# Patient Record
Sex: Female | Born: 1975 | Race: White | Hispanic: No | Marital: Single | Smoking: Never smoker
Health system: Southern US, Community
[De-identification: ages and names within clinical notes are randomized; demographics above are authoritative.]

## PROBLEM LIST (undated history)

## (undated) DIAGNOSIS — F419 Anxiety disorder, unspecified: Secondary | ICD-10-CM

## (undated) DIAGNOSIS — E348 Other specified endocrine disorders: Secondary | ICD-10-CM

## (undated) DIAGNOSIS — R0600 Dyspnea, unspecified: Secondary | ICD-10-CM

## (undated) DIAGNOSIS — R079 Chest pain, unspecified: Secondary | ICD-10-CM

## (undated) DIAGNOSIS — G709 Myoneural disorder, unspecified: Secondary | ICD-10-CM

## (undated) DIAGNOSIS — N183 Chronic kidney disease, stage 3 unspecified: Secondary | ICD-10-CM

## (undated) DIAGNOSIS — F319 Bipolar disorder, unspecified: Secondary | ICD-10-CM

## (undated) DIAGNOSIS — R011 Cardiac murmur, unspecified: Secondary | ICD-10-CM

## (undated) DIAGNOSIS — K7460 Unspecified cirrhosis of liver: Secondary | ICD-10-CM

## (undated) DIAGNOSIS — F609 Personality disorder, unspecified: Secondary | ICD-10-CM

## (undated) DIAGNOSIS — M1990 Unspecified osteoarthritis, unspecified site: Secondary | ICD-10-CM

## (undated) DIAGNOSIS — T7840XA Allergy, unspecified, initial encounter: Secondary | ICD-10-CM

## (undated) DIAGNOSIS — I10 Essential (primary) hypertension: Secondary | ICD-10-CM

## (undated) DIAGNOSIS — I319 Disease of pericardium, unspecified: Secondary | ICD-10-CM

## (undated) DIAGNOSIS — Z944 Liver transplant status: Secondary | ICD-10-CM

## (undated) DIAGNOSIS — F329 Major depressive disorder, single episode, unspecified: Secondary | ICD-10-CM

## (undated) DIAGNOSIS — N939 Abnormal uterine and vaginal bleeding, unspecified: Secondary | ICD-10-CM

## (undated) DIAGNOSIS — E119 Type 2 diabetes mellitus without complications: Secondary | ICD-10-CM

## (undated) DIAGNOSIS — F4310 Post-traumatic stress disorder, unspecified: Secondary | ICD-10-CM

## (undated) DIAGNOSIS — C801 Malignant (primary) neoplasm, unspecified: Secondary | ICD-10-CM

## (undated) DIAGNOSIS — N289 Disorder of kidney and ureter, unspecified: Secondary | ICD-10-CM

## (undated) DIAGNOSIS — G4730 Sleep apnea, unspecified: Secondary | ICD-10-CM

## (undated) DIAGNOSIS — IMO0002 Reserved for concepts with insufficient information to code with codable children: Secondary | ICD-10-CM

## (undated) DIAGNOSIS — I4510 Unspecified right bundle-branch block: Secondary | ICD-10-CM

## (undated) DIAGNOSIS — G43909 Migraine, unspecified, not intractable, without status migrainosus: Secondary | ICD-10-CM

## (undated) DIAGNOSIS — B0229 Other postherpetic nervous system involvement: Secondary | ICD-10-CM

## (undated) DIAGNOSIS — K754 Autoimmune hepatitis: Secondary | ICD-10-CM

## (undated) DIAGNOSIS — K219 Gastro-esophageal reflux disease without esophagitis: Secondary | ICD-10-CM

## (undated) DIAGNOSIS — Z87442 Personal history of urinary calculi: Secondary | ICD-10-CM

## (undated) DIAGNOSIS — I4581 Long QT syndrome: Secondary | ICD-10-CM

## (undated) DIAGNOSIS — C8330 Diffuse large B-cell lymphoma, unspecified site: Secondary | ICD-10-CM

## (undated) DIAGNOSIS — N2889 Other specified disorders of kidney and ureter: Secondary | ICD-10-CM

## (undated) DIAGNOSIS — M329 Systemic lupus erythematosus, unspecified: Secondary | ICD-10-CM

## (undated) DIAGNOSIS — Z820 Family history of epilepsy and other diseases of the nervous system: Secondary | ICD-10-CM

## (undated) DIAGNOSIS — C8590 Non-Hodgkin lymphoma, unspecified, unspecified site: Secondary | ICD-10-CM

## (undated) DIAGNOSIS — G629 Polyneuropathy, unspecified: Secondary | ICD-10-CM

## (undated) DIAGNOSIS — N189 Chronic kidney disease, unspecified: Secondary | ICD-10-CM

## (undated) HISTORY — PX: CHOLECYSTECTOMY: SHX55

## (undated) HISTORY — DX: Anxiety disorder, unspecified: F41.9

## (undated) HISTORY — DX: Chronic kidney disease, stage 3 unspecified: N18.30

## (undated) HISTORY — PX: RENAL BIOPSY: SHX156

## (undated) HISTORY — PX: COLONOSCOPY: SHX174

## (undated) HISTORY — DX: Malignant (primary) neoplasm, unspecified: C80.1

## (undated) HISTORY — DX: Morbid (severe) obesity due to excess calories: E66.01

## (undated) HISTORY — PX: LUMBAR PUNCTURE: SHX1985

## (undated) HISTORY — DX: Cardiac murmur, unspecified: R01.1

## (undated) HISTORY — DX: Disease of pericardium, unspecified: I31.9

## (undated) HISTORY — DX: Allergy, unspecified, initial encounter: T78.40XA

## (undated) HISTORY — DX: Migraine, unspecified, not intractable, without status migrainosus: G43.909

## (undated) HISTORY — DX: Chest pain, unspecified: R07.9

## (undated) HISTORY — DX: Gastro-esophageal reflux disease without esophagitis: K21.9

## (undated) HISTORY — DX: Personality disorder, unspecified: F60.9

## (undated) HISTORY — DX: Post-traumatic stress disorder, unspecified: F43.10

## (undated) HISTORY — DX: Polyneuropathy, unspecified: G62.9

## (undated) HISTORY — DX: Other specified disorders of kidney and ureter: N28.89

## (undated) HISTORY — DX: Long QT syndrome: I45.81

## (undated) HISTORY — DX: Myoneural disorder, unspecified: G70.9

## (undated) HISTORY — PX: ESOPHAGOGASTRODUODENOSCOPY: SHX1529

## (undated) HISTORY — PX: PORT A CATH INJECTION (ARMC HX): HXRAD1731

## (undated) HISTORY — DX: Other postherpetic nervous system involvement: B02.29

## (undated) HISTORY — DX: Major depressive disorder, single episode, unspecified: F32.9

## (undated) HISTORY — DX: Abnormal uterine and vaginal bleeding, unspecified: N93.9

## (undated) HISTORY — DX: Liver transplant status: Z94.4

## (undated) HISTORY — DX: Unspecified right bundle-branch block: I45.10

## (undated) HISTORY — DX: Unspecified osteoarthritis, unspecified site: M19.90

## (undated) HISTORY — DX: Bipolar disorder, unspecified: F31.9

## (undated) HISTORY — PX: HERNIA REPAIR: SHX51

## (undated) HISTORY — PX: OTHER SURGICAL HISTORY: SHX169

---
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
## 2009-10-07 HISTORY — PX: BREAST BIOPSY: SHX20

## 2010-12-26 DIAGNOSIS — M545 Low back pain, unspecified: Secondary | ICD-10-CM | POA: Insufficient documentation

## 2012-06-29 DIAGNOSIS — G519 Disorder of facial nerve, unspecified: Secondary | ICD-10-CM | POA: Insufficient documentation

## 2012-12-28 DIAGNOSIS — N2889 Other specified disorders of kidney and ureter: Secondary | ICD-10-CM | POA: Insufficient documentation

## 2013-07-26 DIAGNOSIS — E1165 Type 2 diabetes mellitus with hyperglycemia: Secondary | ICD-10-CM | POA: Insufficient documentation

## 2013-07-26 DIAGNOSIS — IMO0002 Reserved for concepts with insufficient information to code with codable children: Secondary | ICD-10-CM | POA: Insufficient documentation

## 2013-07-26 DIAGNOSIS — Z944 Liver transplant status: Secondary | ICD-10-CM | POA: Insufficient documentation

## 2013-07-26 DIAGNOSIS — M5116 Intervertebral disc disorders with radiculopathy, lumbar region: Secondary | ICD-10-CM | POA: Insufficient documentation

## 2013-10-07 DIAGNOSIS — C8330 Diffuse large B-cell lymphoma, unspecified site: Secondary | ICD-10-CM

## 2013-10-07 HISTORY — PX: OTHER SURGICAL HISTORY: SHX169

## 2013-10-07 HISTORY — DX: Diffuse large B-cell lymphoma, unspecified site: C83.30

## 2014-10-07 HISTORY — PX: BREAST SURGERY: SHX581

## 2014-12-06 HISTORY — PX: BREAST BIOPSY: SHX20

## 2015-01-13 DIAGNOSIS — C8330 Diffuse large B-cell lymphoma, unspecified site: Secondary | ICD-10-CM | POA: Insufficient documentation

## 2015-01-13 HISTORY — PX: BONE MARROW BIOPSY: SHX199

## 2016-11-19 DIAGNOSIS — M792 Neuralgia and neuritis, unspecified: Secondary | ICD-10-CM | POA: Insufficient documentation

## 2016-11-19 DIAGNOSIS — B0229 Other postherpetic nervous system involvement: Secondary | ICD-10-CM | POA: Insufficient documentation

## 2017-02-07 DIAGNOSIS — N2581 Secondary hyperparathyroidism of renal origin: Secondary | ICD-10-CM | POA: Insufficient documentation

## 2017-03-18 DIAGNOSIS — F339 Major depressive disorder, recurrent, unspecified: Secondary | ICD-10-CM | POA: Insufficient documentation

## 2017-03-18 DIAGNOSIS — N939 Abnormal uterine and vaginal bleeding, unspecified: Secondary | ICD-10-CM | POA: Insufficient documentation

## 2017-07-02 DIAGNOSIS — F3175 Bipolar disorder, in partial remission, most recent episode depressed: Secondary | ICD-10-CM | POA: Insufficient documentation

## 2017-07-02 DIAGNOSIS — F319 Bipolar disorder, unspecified: Secondary | ICD-10-CM | POA: Insufficient documentation

## 2019-03-18 ENCOUNTER — Encounter: Payer: Self-pay | Admitting: Family Medicine

## 2019-03-21 ENCOUNTER — Other Ambulatory Visit: Payer: Self-pay

## 2019-03-21 ENCOUNTER — Encounter: Payer: Self-pay | Admitting: Family Medicine

## 2019-03-21 ENCOUNTER — Encounter: Payer: Self-pay | Admitting: Intensive Care

## 2019-03-21 ENCOUNTER — Emergency Department
Admission: EM | Admit: 2019-03-21 | Discharge: 2019-03-21 | Disposition: A | Discharge status: Home / Self Care | Payer: Medicaid Other | Attending: Emergency Medicine | Admitting: Emergency Medicine

## 2019-03-21 DIAGNOSIS — R51 Headache: Secondary | ICD-10-CM | POA: Diagnosis not present

## 2019-03-21 DIAGNOSIS — N189 Chronic kidney disease, unspecified: Secondary | ICD-10-CM | POA: Diagnosis not present

## 2019-03-21 DIAGNOSIS — I129 Hypertensive chronic kidney disease with stage 1 through stage 4 chronic kidney disease, or unspecified chronic kidney disease: Secondary | ICD-10-CM | POA: Insufficient documentation

## 2019-03-21 DIAGNOSIS — E119 Type 2 diabetes mellitus without complications: Secondary | ICD-10-CM | POA: Diagnosis not present

## 2019-03-21 DIAGNOSIS — R519 Headache, unspecified: Secondary | ICD-10-CM

## 2019-03-21 DIAGNOSIS — L930 Discoid lupus erythematosus: Secondary | ICD-10-CM | POA: Diagnosis not present

## 2019-03-21 DIAGNOSIS — Z760 Encounter for issue of repeat prescription: Secondary | ICD-10-CM | POA: Insufficient documentation

## 2019-03-21 HISTORY — DX: Essential (primary) hypertension: I10

## 2019-03-21 HISTORY — DX: Type 2 diabetes mellitus without complications: E11.9

## 2019-03-21 HISTORY — DX: Chronic kidney disease, unspecified: N18.9

## 2019-03-21 HISTORY — DX: Systemic lupus erythematosus, unspecified: M32.9

## 2019-03-21 HISTORY — DX: Family history of epilepsy and other diseases of the nervous system: Z82.0

## 2019-03-21 HISTORY — DX: Disorder of kidney and ureter, unspecified: N28.9

## 2019-03-21 HISTORY — DX: Reserved for concepts with insufficient information to code with codable children: IMO0002

## 2019-03-21 MED ORDER — PREGABALIN 200 MG PO CAPS: 200.0000 mg | ORAL_CAPSULE | Freq: Three times a day (TID) | ORAL | 0 refills | Status: DC

## 2019-03-21 MED ORDER — KETOROLAC TROMETHAMINE 30 MG/ML IJ SOLN
30.0000 mg | Freq: Once | INTRAMUSCULAR | Status: AC
  Administered 2019-03-21: 30 mg via INTRAMUSCULAR
  Filled 2019-03-21: qty 1

## 2019-03-21 NOTE — ED Notes (Signed)
Pt verbalized understanding of discharge instructions. NAD at this time. 

## 2019-03-21 NOTE — ED Triage Notes (Signed)
Patient reports she is here for a lyrica refill

## 2019-03-21 NOTE — ED Provider Notes (Signed)
[facility] Emergency Department Provider Note  ____________________________________________  Time seen: Approximately 4:03 PM  I have reviewed the triage vital signs and the nursing notes.   HISTORY  Chief Complaint No chief complaint on file.    HPI Alison [name] is a 43 y.o. female presents to the emergency department for a refill of Lyrica.  Patient states that she has a [place] but [place]  She has an appointment with a local PCP for 29 June.  She has been taking a half dose of her Lyrica and has had a headache since reducing dose.  No other alleviating measures have been attempted.        Past Medical History:  Diagnosis Date  . Chronic kidney failure   . Diabetes mellitus without complication ([place])   . FH: trigeminal neuralgia   . Hypertension   . Lupus ([place])   . Lymphoma ([place])   . Renal disorder     There are no active problems to display for this patient.   Past Surgical History:  Procedure Laterality Date  . LIVER TRANSPLANT  12/17/1991    Prior to Admission medications   Medication Sig Start Date End Date Taking? Authorizing Provider  pregabalin (LYRICA) 200 MG capsule Take 1 capsule (200 mg total) by mouth 3 (three) times daily for 30 days. 03/21/19 04/20/19  Lannie Fields, PA-C    Allergies Penicillins  History reviewed. No pertinent family history.  Social History Social History   Tobacco Use  . Smoking status: Never Smoker  . Smokeless tobacco: Never Used  Substance Use Topics  . Alcohol use: Never    Frequency: Never  . Drug use: Not Currently    Types: Marijuana     Review of Systems  Constitutional: No fever/chills Eyes: No visual changes. No discharge ENT: No upper respiratory complaints. Cardiovascular: no chest pain. Respiratory: no cough. No SOB. Gastrointestinal: No abdominal pain.  No nausea, no vomiting.  No diarrhea.  No constipation. Musculoskeletal: Negative  for musculoskeletal pain. Skin: Negative for rash, abrasions, lacerations, ecchymosis. Neurological: Patient has headache, no focal weakness or numbness.   ____________________________________________   PHYSICAL EXAM:  VITAL SIGNS: ED Triage Vitals  Enc Vitals Group     BP 03/21/19 1401 122/69     Pulse Rate 03/21/19 1353 92     Resp 03/21/19 1353 16     Temp 03/21/19 1353 98.6 F (37 C)     Temp Source 03/21/19 1353 Oral     SpO2 03/21/19 1353 94 %     Weight 03/21/19 1355 260 lb (117.9 kg)     Height 03/21/19 1355 5' 6"  (1.676 m)     Head Circumference --      Peak Flow --      Pain Score 03/21/19 1354 7     Pain Loc --      Pain Edu? --      Excl. in [place]? --      Constitutional: Alert and oriented. Well appearing and in no acute distress. Eyes: Conjunctivae are normal. PERRL. EOMI. Head: Atraumatic. Cardiovascular: Normal rate, regular rhythm. Normal S1 and S2.  Good peripheral circulation. Respiratory: Normal respiratory effort without tachypnea or retractions. Lungs CTAB. Good air entry to the bases with no decreased or absent breath sounds. Gastrointestinal: Bowel sounds 4 quadrants. Soft and nontender to palpation. No guarding or rigidity. No palpable masses. No distention. No CVA tenderness. Musculoskeletal: Full range of motion to all  extremities. No gross deformities appreciated. Neurologic:  Normal speech and language. No gross focal neurologic deficits are appreciated.  Skin:  Skin is warm, dry and intact. No rash noted. Psychiatric: Mood and affect are normal. Speech and behavior are normal. Patient exhibits appropriate insight and judgement.   ____________________________________________   LABS (all labs ordered are listed, but only abnormal results are displayed)  Labs Reviewed - No data to display ____________________________________________  EKG   ____________________________________________  RADIOLOGY   No results  found.  ____________________________________________    PROCEDURES  Procedure(s) performed:    Procedures    Medications  ketorolac (TORADOL) 30 MG/ML injection 30 mg (has no administration in time range)     ____________________________________________   INITIAL IMPRESSION / ASSESSMENT AND PLAN / ED COURSE  Pertinent labs & imaging results that were available during my care of the patient were reviewed by me and considered in my medical decision making (see chart for details).  Review of the [place] CSRS was performed in accordance of the [place] prior to dispensing any controlled drugs.           Assessment and plan Medication refill Patient presents to the emergency department for medication refill of her Lyrica.  Patient's medication was refilled at 200 mg 3 times daily until patient is seen and evaluated by her new primary care provider.  She was given Toradol in the emergency department for headache.  All patient questions were answered.   ____________________________________________  FINAL CLINICAL IMPRESSION(S) / ED DIAGNOSES  Final diagnoses:  Medication refill  Acute nonintractable headache, unspecified headache type      NEW MEDICATIONS STARTED DURING THIS VISIT:  ED Discharge Orders         Ordered    pregabalin (LYRICA) 200 MG capsule  3 times daily     03/21/19 1558              This chart was dictated using voice recognition software/Dragon. Despite best efforts to proofread, errors can occur which can change the meaning. Any change was purely unintentional.    Lannie Fields, PA-C 03/21/19 1606    Harvest Dark, MD 03/21/19 2342

## 2019-03-22 ENCOUNTER — Other Ambulatory Visit: Payer: Self-pay

## 2019-03-23 ENCOUNTER — Encounter: Payer: Self-pay | Admitting: Oncology

## 2019-03-23 ENCOUNTER — Inpatient Hospital Stay: Payer: Medicaid Other | Attending: Oncology | Admitting: Oncology

## 2019-03-23 ENCOUNTER — Other Ambulatory Visit: Payer: Self-pay

## 2019-03-23 ENCOUNTER — Inpatient Hospital Stay: Payer: Medicaid Other

## 2019-03-23 ENCOUNTER — Telehealth: Payer: Self-pay

## 2019-03-23 VITALS — BP 119/75 | HR 80 | Temp 97.8°F | Ht 66.0 in | Wt 249.7 lb

## 2019-03-23 DIAGNOSIS — Z944 Liver transplant status: Secondary | ICD-10-CM | POA: Insufficient documentation

## 2019-03-23 DIAGNOSIS — K759 Inflammatory liver disease, unspecified: Secondary | ICD-10-CM | POA: Insufficient documentation

## 2019-03-23 DIAGNOSIS — M329 Systemic lupus erythematosus, unspecified: Secondary | ICD-10-CM

## 2019-03-23 DIAGNOSIS — T8640 Unspecified complication of liver transplant: Secondary | ICD-10-CM | POA: Diagnosis not present

## 2019-03-23 DIAGNOSIS — R634 Abnormal weight loss: Secondary | ICD-10-CM | POA: Insufficient documentation

## 2019-03-23 DIAGNOSIS — C8330 Diffuse large B-cell lymphoma, unspecified site: Secondary | ICD-10-CM | POA: Insufficient documentation

## 2019-03-23 DIAGNOSIS — E785 Hyperlipidemia, unspecified: Secondary | ICD-10-CM | POA: Insufficient documentation

## 2019-03-23 DIAGNOSIS — R9389 Abnormal findings on diagnostic imaging of other specified body structures: Secondary | ICD-10-CM

## 2019-03-23 DIAGNOSIS — I129 Hypertensive chronic kidney disease with stage 1 through stage 4 chronic kidney disease, or unspecified chronic kidney disease: Secondary | ICD-10-CM | POA: Insufficient documentation

## 2019-03-23 DIAGNOSIS — Z9221 Personal history of antineoplastic chemotherapy: Secondary | ICD-10-CM | POA: Diagnosis not present

## 2019-03-23 DIAGNOSIS — Z7984 Long term (current) use of oral hypoglycemic drugs: Secondary | ICD-10-CM | POA: Insufficient documentation

## 2019-03-23 DIAGNOSIS — M1990 Unspecified osteoarthritis, unspecified site: Secondary | ICD-10-CM | POA: Diagnosis not present

## 2019-03-23 DIAGNOSIS — K754 Autoimmune hepatitis: Secondary | ICD-10-CM | POA: Insufficient documentation

## 2019-03-23 DIAGNOSIS — E669 Obesity, unspecified: Secondary | ICD-10-CM | POA: Diagnosis not present

## 2019-03-23 DIAGNOSIS — R5381 Other malaise: Secondary | ICD-10-CM | POA: Diagnosis not present

## 2019-03-23 DIAGNOSIS — M549 Dorsalgia, unspecified: Secondary | ICD-10-CM | POA: Insufficient documentation

## 2019-03-23 DIAGNOSIS — G43909 Migraine, unspecified, not intractable, without status migrainosus: Secondary | ICD-10-CM | POA: Insufficient documentation

## 2019-03-23 DIAGNOSIS — N183 Chronic kidney disease, stage 3 unspecified: Secondary | ICD-10-CM | POA: Insufficient documentation

## 2019-03-23 DIAGNOSIS — G8929 Other chronic pain: Secondary | ICD-10-CM | POA: Insufficient documentation

## 2019-03-23 DIAGNOSIS — G4733 Obstructive sleep apnea (adult) (pediatric): Secondary | ICD-10-CM | POA: Insufficient documentation

## 2019-03-23 DIAGNOSIS — R5383 Other fatigue: Secondary | ICD-10-CM | POA: Diagnosis not present

## 2019-03-23 DIAGNOSIS — C801 Malignant (primary) neoplasm, unspecified: Secondary | ICD-10-CM | POA: Insufficient documentation

## 2019-03-23 DIAGNOSIS — E119 Type 2 diabetes mellitus without complications: Secondary | ICD-10-CM | POA: Insufficient documentation

## 2019-03-23 DIAGNOSIS — E1122 Type 2 diabetes mellitus with diabetic chronic kidney disease: Secondary | ICD-10-CM | POA: Insufficient documentation

## 2019-03-23 DIAGNOSIS — R110 Nausea: Secondary | ICD-10-CM | POA: Insufficient documentation

## 2019-03-23 DIAGNOSIS — C8590 Non-Hodgkin lymphoma, unspecified, unspecified site: Secondary | ICD-10-CM

## 2019-03-23 DIAGNOSIS — G629 Polyneuropathy, unspecified: Secondary | ICD-10-CM | POA: Insufficient documentation

## 2019-03-23 DIAGNOSIS — N200 Calculus of kidney: Secondary | ICD-10-CM | POA: Insufficient documentation

## 2019-03-23 DIAGNOSIS — G500 Trigeminal neuralgia: Secondary | ICD-10-CM | POA: Insufficient documentation

## 2019-03-23 DIAGNOSIS — K769 Liver disease, unspecified: Secondary | ICD-10-CM

## 2019-03-23 DIAGNOSIS — D47Z1 Post-transplant lymphoproliferative disorder (PTLD): Secondary | ICD-10-CM | POA: Diagnosis not present

## 2019-03-23 DIAGNOSIS — F319 Bipolar disorder, unspecified: Secondary | ICD-10-CM | POA: Insufficient documentation

## 2019-03-23 DIAGNOSIS — Z9989 Dependence on other enabling machines and devices: Secondary | ICD-10-CM | POA: Insufficient documentation

## 2019-03-23 DIAGNOSIS — I10 Essential (primary) hypertension: Secondary | ICD-10-CM | POA: Insufficient documentation

## 2019-03-23 DIAGNOSIS — Z79899 Other long term (current) drug therapy: Secondary | ICD-10-CM | POA: Diagnosis not present

## 2019-03-23 DIAGNOSIS — N2889 Other specified disorders of kidney and ureter: Secondary | ICD-10-CM | POA: Insufficient documentation

## 2019-03-23 DIAGNOSIS — X58XXXS Exposure to other specified factors, sequela: Secondary | ICD-10-CM | POA: Diagnosis not present

## 2019-03-23 DIAGNOSIS — R011 Cardiac murmur, unspecified: Secondary | ICD-10-CM | POA: Insufficient documentation

## 2019-03-23 DIAGNOSIS — M797 Fibromyalgia: Secondary | ICD-10-CM | POA: Diagnosis not present

## 2019-03-23 DIAGNOSIS — N6019 Diffuse cystic mastopathy of unspecified breast: Secondary | ICD-10-CM | POA: Insufficient documentation

## 2019-03-23 LAB — COMPREHENSIVE METABOLIC PANEL
ALT: 25 U/L (ref 0–44)
AST: 30 U/L (ref 15–41)
Albumin: 3.9 g/dL (ref 3.5–5.0)
Alkaline Phosphatase: 56 U/L (ref 38–126)
Anion gap: 9 (ref 5–15)
BUN: 30 mg/dL — ABNORMAL HIGH (ref 6–20)
CO2: 23 mmol/L (ref 22–32)
Calcium: 8.9 mg/dL (ref 8.9–10.3)
Chloride: 106 mmol/L (ref 98–111)
Creatinine, Ser: 1.01 mg/dL — ABNORMAL HIGH (ref 0.44–1.00)
GFR calc Af Amer: >60 mL/min (ref >60)
GFR calc non Af Amer: >60 mL/min (ref >60)
Glucose, Bld: 145 mg/dL — ABNORMAL HIGH (ref 70–99)
Potassium: 4.6 mmol/L (ref 3.5–5.1)
Sodium: 138 mmol/L (ref 135–145)
Total Bilirubin: 0.4 mg/dL (ref 0.3–1.2)
Total Protein: 7.6 g/dL (ref 6.5–8.1)

## 2019-03-23 LAB — CBC WITH DIFFERENTIAL/PLATELET
Abs Immature Granulocytes: 0.01 10*3/uL (ref 0.00–0.07)
Basophils Absolute: 0 10*3/uL (ref 0.0–0.1)
Basophils Relative: 1 %
Eosinophils Absolute: 0.4 10*3/uL (ref 0.0–0.5)
Eosinophils Relative: 7 %
HCT: 39.8 % (ref 36.0–46.0)
Hemoglobin: 12.9 g/dL (ref 12.0–15.0)
Immature Granulocytes: 0 %
Lymphocytes Relative: 36 %
Lymphs Abs: 2.2 10*3/uL (ref 0.7–4.0)
MCH: 28.8 pg (ref 26.0–34.0)
MCHC: 32.4 g/dL (ref 30.0–36.0)
MCV: 88.8 fL (ref 80.0–100.0)
Monocytes Absolute: 0.6 10*3/uL (ref 0.1–1.0)
Monocytes Relative: 9 %
Neutro Abs: 3 10*3/uL (ref 1.7–7.7)
Neutrophils Relative %: 47 %
Platelets: 208 10*3/uL (ref 150–400)
RBC: 4.48 MIL/uL (ref 3.87–5.11)
RDW: 12.6 % (ref 11.5–15.5)
WBC: 6.2 10*3/uL (ref 4.0–10.5)
nRBC: 0 % (ref 0.0–0.2)

## 2019-03-23 LAB — URIC ACID: Uric Acid, Serum: 9.7 mg/dL — ABNORMAL HIGH (ref 2.5–7.1)

## 2019-03-23 LAB — LACTATE DEHYDROGENASE: LDH: 123 U/L (ref 98–192)

## 2019-03-23 NOTE — Progress Notes (Signed)
Patient stated that she has had heavy night sweats, nausea, poor appetite, and pain on her neck and jaw.

## 2019-03-23 NOTE — Telephone Encounter (Signed)
Patient's medical records were faxed to [facility]. They will review chart and then contact patient. Tel: [phone] Fax: [phone]

## 2019-03-24 ENCOUNTER — Encounter: Payer: Self-pay | Admitting: Oncology

## 2019-03-24 LAB — HEPATITIS B SURFACE ANTIGEN: Hepatitis B Surface Ag: NEGATIVE

## 2019-03-24 LAB — HEPATITIS C ANTIBODY: HCV Ab: <0.1 s/co ratio (ref 0.0–0.9)

## 2019-03-24 LAB — HEPATITIS B CORE ANTIBODY, TOTAL: Hep B Core Total Ab: NEGATIVE

## 2019-03-24 LAB — HEPATITIS B SURFACE ANTIBODY, QUANTITATIVE: Hep B S AB Quant (Post): <3.1 m[IU]/mL — ABNORMAL LOW (ref >9.9)

## 2019-03-24 NOTE — Progress Notes (Addendum)
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  She is currently on CellCept and tacrolimus for the same.  She also has a history of post transplant lymphoproliferative disorder/DLBCL that was diagnosed in 2016.  She is s/p 6 cycles of R-CHOP chemotherapy back then and was in complete remission 1.  Her other past medical history significant for migraines, stage III chronic kidney disease, chemo-induced peripheral neuropathy, hypertension among other medical problems.  With regards to her diffuse large B-cell lymphoma she was getting surveillance scans and this was all done in [place].  She has now moved to [facility] to be with her significant other.  She underwent CT neck with contrast before she left [place] on 02/22/2019.  She was noted to have soft tissue masses in both the parotid glands which were new as compared to prior exams and possibly represent lymph nodes.  The largest one was seen in the left parotid gland measuring 1.3 x 1.1 cm.  Before she could get a work-up for this patient moved to [place] and has not had any further work-up yet  Her other prior imaging is as follows: MRI thoracic spine without contrast in August 2019 showed moderate multilevel spondylosis but no evidence of lymphoma.  Multinodular thyroid in June 2019.  MRI cervical spine May 2019 again showed spondylosis but no other acute pathology.  MRI brain showed incidental partial opacification of the right and right sinus.   No evidence of malignancy.  I do not have any other PET CT scan or treatment records from the past.  She will be establishing primary care soon. She has not seen any other physicians yet. She repports left neck swelling. Reports fatigue and feels like she has lost about 10 pounds in the last 1 month  ECOG PS- 1  Pain scale- 4- chronic back pain   Review of systems- Review of Systems  Constitutional: Positive for malaise/fatigue and weight loss. Negative for chills and fever.  HENT: Negative for congestion, ear discharge and nosebleeds.        Neck swelling  Eyes: Negative for blurred vision.  Respiratory: Negative for cough, hemoptysis, sputum production, shortness of breath and wheezing.   Cardiovascular: Negative for chest pain, palpitations, orthopnea and claudication.  Gastrointestinal: Negative for abdominal pain, blood in stool, constipation, diarrhea, heartburn, melena, nausea and vomiting.  Genitourinary: Negative for dysuria, flank pain, frequency, hematuria and urgency.  Musculoskeletal: Positive for back pain. Negative for joint pain and myalgias.  Skin: Negative for rash.  Neurological: Negative for dizziness, tingling, focal weakness, seizures, weakness and headaches.  Endo/Heme/Allergies: Does not bruise/bleed easily.  Psychiatric/Behavioral: Negative for depression and suicidal ideas. The patient does not have insomnia.     Allergies  Allergen Reactions  . Penicillin G Itching and Rash    Patient Active Problem List   Diagnosis Date Noted  . Fibromyalgia 03/23/2019  . Systemic lupus erythematosus (SLE) in adult ([place]) 03/23/2019  . Essential hypertension 03/23/2019  . Hepatitis 03/23/2019  . Mass of left kidney 03/23/2019  . Diabetes mellitus ([place])  03/23/2019  . Nausea without vomiting 03/23/2019  . Neuropathy 03/23/2019  . Cancer ([place]) 03/23/2019  . Hyperlipidemia 03/23/2019  . Osteoarthritis 03/23/2019  . Trigeminal neuralgia 03/23/2019  . Chronic kidney  disease 03/23/2019  . Nephrolithiasis 03/23/2019  . Migraine 03/23/2019  . OSA on CPAP 03/23/2019  . Fibrocystic breast 03/23/2019  . Heart murmur 03/23/2019     Past Medical History:  Diagnosis Date  . Abnormal uterine bleeding   . Bipolar disorder (manic depression) ([place])   . Cancer ([place])   . Chronic kidney failure   . Chronic renal disease, stage III ([place])   . Diabetes mellitus without complication ([place])   . 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[place])   . FH: trigeminal neuralgia   . Hypertension   . Kidney mass   . Lupus ([place])   . Lymphoma ([place])   . Major depressive disorder   . Migraine   . Morbid obesity ([place])   . Neuropathy   . Post herpetic neuralgia   . Renal disorder   . S/P liver transplant [facility])      Past Surgical History:  Procedure Laterality Date  . BONE MARROW BIOPSY  01/13/2015  . BREAST BIOPSY  12/2014  . BREAST BIOPSY  2011  . BREAST SURGERY    . CHOLECYSTECTOMY    . HERNIA REPAIR    . infusaport    . LIVER TRANSPLANT  12/17/1991  . LUMBAR PUNCTURE    . tumor removal  2015    Social History   Socioeconomic History  . Marital status: Single    Spouse name: Not on file  . Number of children: Not on file  . Years of education: Not on file  . Highest education level: Not on file  Occupational History  . Not on file  Social Needs  . Financial resource strain: Not on file  . Food insecurity    Worry: Not on file    Inability: Not on file  . Transportation needs    Medical: Not on file    Non-medical: Not on file  Tobacco Use  . Smoking status: Never Smoker  . Smokeless tobacco: Never Used  Substance and Sexual Activity  . Alcohol use: Never    Frequency: Never  . Drug use: Not Currently    Types: Marijuana  . Sexual activity: Not on file  Lifestyle  . Physical activity    Days per week: Not on file    Minutes per session: Not on file  . Stress: Not on file  Relationships  . Social Herbalist on phone: Not on file     Gets together: Not on file    Attends religious service: Not on file    Active member of club or organization: Not on file    Attends meetings of clubs or organizations: Not on file    Relationship status: Not on file  . Intimate partner violence    Fear of current or ex partner: Not on file    Emotionally abused: Not on file    Physically abused: Not on file    Forced sexual activity: Not on file  Other Topics Concern  . Not on file  Social History Narrative  . Not on file     Family History  Problem Relation Age of Onset  . Heart disease Mother   . Hypertension Mother   . Heart disease Father   . Hypertension Father   . Cancer Maternal Grandmother   .  Cancer Maternal Aunt   . Cancer Maternal Uncle      Current Outpatient Medications:  .  baclofen (LIORESAL) 20 MG tablet, Take 1 tablet by mouth 3 (three) times daily., Disp: , Rfl:  .  Cranberry 125 MG TABS, Take 1 tablet by mouth 1 day or 1 dose., Disp: , Rfl:  .  diphenhydrAMINE (BENADRYL) 50 MG capsule, Take 50 mg by mouth every 6 (six) hours as needed., Disp: , Rfl:  .  HYDROmorphone (DILAUDID) 4 MG tablet, Take 1 tablet by mouth every 6 (six) hours as needed., Disp: , Rfl:  .  lisinopril (ZESTRIL) 20 MG tablet, Take 1 tablet by mouth 1 day or 1 dose., Disp: , Rfl:  .  LORazepam (ATIVAN) 1 MG tablet, Take 1 tablet by mouth at bedtime as needed., Disp: , Rfl:  .  metFORMIN (GLUCOPHAGE) 1000 MG tablet, Take 1 tablet by mouth 2 (two) times a day., Disp: , Rfl:  .  mycophenolate (CELLCEPT) 250 MG capsule, Take 1 capsule by mouth 2 (two) times a day., Disp: , Rfl:  .  oxycodone (ROXICODONE) 30 MG immediate release tablet, Take 1 tablet by mouth every 4 (four) hours as needed., Disp: , Rfl:  .  pregabalin (LYRICA) 200 MG capsule, Take 1 capsule (200 mg total) by mouth 3 (three) times daily for 30 days., Disp: 100 capsule, Rfl: 0 .  promethazine (PHENERGAN) 25 MG tablet, Take 1 tablet by mouth as needed., Disp: , Rfl:  .   tacrolimus (PROGRAF) 1 MG capsule, Take 1 capsule by mouth 1 day or 1 dose., Disp: , Rfl:    Physical exam:  Vitals:   03/23/19 1401 03/24/19 1309  BP:  119/75  Pulse:  80  Temp: 97.8 F (36.6 C)   TempSrc: Tympanic   Weight: 249 lb 11.2 oz (113.3 kg)   Height: 5' 6"  (1.676 m)    Physical Exam Constitutional:      General: She is not in acute distress.    Appearance: She is obese.  HENT:     Head: Normocephalic and atraumatic.  Eyes:     Pupils: Pupils are equal, round, and reactive to light.  Neck:     Musculoskeletal: Normal range of motion.  Cardiovascular:     Rate and Rhythm: Normal rate and regular rhythm.     Heart sounds: Normal heart sounds.  Pulmonary:     Effort: Pulmonary effort is normal.     Breath sounds: Normal breath sounds.  Abdominal:     General: Bowel sounds are normal. There is no distension.     Palpations: Abdomen is soft.     Tenderness: There is no abdominal tenderness.     Comments: No palpable splenomegaly  Lymphadenopathy:     Comments: There is a palpable pea sized 1 cm lymph node adjacent to left parotid gland. No palpable cervical, supraclavicular, axillary or inguinal adenopathy  Skin:    General: Skin is warm and dry.  Neurological:     Mental Status: She is alert and oriented to person, place, and time.        CMP Latest Ref Rng & Units 03/23/2019  Glucose 70 - 99 mg/dL 145(H)  BUN 6 - 20 mg/dL 30(H)  Creatinine 0.44 - 1.00 mg/dL 1.01(H)  Sodium 135 - 145 mmol/L 138  Potassium 3.5 - 5.1 mmol/L 4.6  Chloride 98 - 111 mmol/L 106  CO2 22 - 32 mmol/L 23  Calcium 8.9 - 10.3 mg/dL 8.9  Total Protein 6.5 -  8.1 g/dL 7.6  Total Bilirubin 0.3 - 1.2 mg/dL 0.4  Alkaline Phos 38 - 126 U/L 56  AST 15 - 41 U/L 30  ALT 0 - 44 U/L 25   CBC Latest Ref Rng & Units 03/23/2019  WBC 4.0 - 10.5 K/uL 6.2  Hemoglobin 12.0 - 15.0 g/dL 12.9  Hematocrit 36.0 - 46.0 % 39.8  Platelets 150 - 400 K/uL 208     Assessment and plan- Patient is a 43  y.o. female with h/o stage III CKD, liver transplant on immunosuppression and h/o dlbcl s/p ? RCHOP chemotherapy in 2016 presenting to establish oncology care  1. Patient has palpable pea sized cervical node palpable adjacent to left parotid gland. No other sites of palpable adenopathy. Given her prior h/o DLBCL, I will check cbc with diff, cmp, LDH hepatitis serology and uric acid today. Will obtain PET/CT scan at this time. Depending on findings of PET/CT- if there are concerning findings for recurrent lymphoma- she will need excisional biopsy  2. Refer to [facility] transplant to establish care given her h/o liver transplant on immunosuppression  3. Refer to nephrology given her h/o CKD  I will tentatively see her back in 10 days and hopefully by the she would have had pet/ct and biopsy   Total face to face encounter time for this patient visit was 40 min. >50% of the time was  spent in counseling and coordination of care.     Thank you for this kind referral and the opportunity to participate in the care of this patient   Visit Diagnosis 1. Lymphoma, unspecified body region, unspecified lymphoma type ([place])   2. Abnormal CT scan   3. Chronic renal disease, stage III ([place])     Dr. Randa Evens, MD, MPH [facility] at [facility] [phone] 03/24/2019 12:56 PM

## 2019-03-25 ENCOUNTER — Telehealth: Payer: Self-pay | Admitting: *Deleted

## 2019-03-25 NOTE — Telephone Encounter (Signed)
Patient moved from [place] to [place].   I contacted [facility] at [phone]  and was sent to medical records and to fax a request with consent attached to [phone]. I also asked about get disc for the radiology imaging.  I was told that if I put on consent and fax cover sheet after the get records gathered they will send request to radiology for the discs. I then called [facility] which [facility] cancer center [phone]. I was told to fax consent with what items are needed and they will send it to me. 3rd request was a referral to liver transplant at [facility]. I have contacted the [facility] transplant at [phone] and was transferred to [facility] at [phone] and I left a message about the above patient that has already had a liver transplant and she will need to have a doctor to manage her care She is currently on cellcept and tacrolimus.will await a call back

## 2019-03-26 ENCOUNTER — Telehealth: Payer: Self-pay | Admitting: *Deleted

## 2019-03-26 NOTE — Telephone Encounter (Signed)
Called pt. To let her know that the pet scan has not been approved and it has been r/s 6/29 12:30 she already has the instructions and I explained that the insurance has not approved it yet. She understands. She also said that she wanted to see if she could have her meds refilled for pain. I told her that I did not hear from Dr Janese Banks that we are giving pain med refill. She states she has 2 weeks worth she felt but she could let me know. I gave her my number to call. I said I would ask Janese Banks if she was going to prescribe them. Also we have cancelled her appt to see D.r. Janese Banks. She needs pet scan to make a plan and once she sees results she will call pt and make an appt for her. Patient agreeable.

## 2019-03-30 ENCOUNTER — Ambulatory Visit: Payer: Medicaid Other

## 2019-04-01 ENCOUNTER — Encounter: Payer: Self-pay | Admitting: *Deleted

## 2019-04-02 ENCOUNTER — Ambulatory Visit: Payer: Medicaid Other | Admitting: Oncology

## 2019-04-05 ENCOUNTER — Ambulatory Visit: Payer: Medicaid Other

## 2019-04-05 ENCOUNTER — Ambulatory Visit (INDEPENDENT_AMBULATORY_CARE_PROVIDER_SITE_OTHER): Payer: Medicaid Other | Admitting: Family Medicine

## 2019-04-05 ENCOUNTER — Other Ambulatory Visit: Payer: Self-pay

## 2019-04-05 ENCOUNTER — Encounter: Payer: Self-pay | Admitting: Family Medicine

## 2019-04-05 DIAGNOSIS — F329 Major depressive disorder, single episode, unspecified: Secondary | ICD-10-CM

## 2019-04-05 DIAGNOSIS — M329 Systemic lupus erythematosus, unspecified: Secondary | ICD-10-CM

## 2019-04-05 DIAGNOSIS — G629 Polyneuropathy, unspecified: Secondary | ICD-10-CM

## 2019-04-05 DIAGNOSIS — N183 Chronic kidney disease, stage 3 unspecified: Secondary | ICD-10-CM

## 2019-04-05 DIAGNOSIS — E348 Other specified endocrine disorders: Secondary | ICD-10-CM | POA: Diagnosis not present

## 2019-04-05 DIAGNOSIS — G43809 Other migraine, not intractable, without status migrainosus: Secondary | ICD-10-CM

## 2019-04-05 DIAGNOSIS — F3181 Bipolar II disorder: Secondary | ICD-10-CM

## 2019-04-05 DIAGNOSIS — C8590 Non-Hodgkin lymphoma, unspecified, unspecified site: Secondary | ICD-10-CM | POA: Diagnosis not present

## 2019-04-05 DIAGNOSIS — M5030 Other cervical disc degeneration, unspecified cervical region: Secondary | ICD-10-CM

## 2019-04-05 DIAGNOSIS — M797 Fibromyalgia: Secondary | ICD-10-CM

## 2019-04-05 DIAGNOSIS — F419 Anxiety disorder, unspecified: Secondary | ICD-10-CM

## 2019-04-05 DIAGNOSIS — M79672 Pain in left foot: Secondary | ICD-10-CM

## 2019-04-05 DIAGNOSIS — Z13220 Encounter for screening for lipoid disorders: Secondary | ICD-10-CM

## 2019-04-05 DIAGNOSIS — F32A Depression, unspecified: Secondary | ICD-10-CM

## 2019-04-05 DIAGNOSIS — Z944 Liver transplant status: Secondary | ICD-10-CM | POA: Diagnosis not present

## 2019-04-05 DIAGNOSIS — E1165 Type 2 diabetes mellitus with hyperglycemia: Secondary | ICD-10-CM

## 2019-04-05 DIAGNOSIS — K429 Umbilical hernia without obstruction or gangrene: Secondary | ICD-10-CM

## 2019-04-05 DIAGNOSIS — I10 Essential (primary) hypertension: Secondary | ICD-10-CM | POA: Diagnosis not present

## 2019-04-05 DIAGNOSIS — G500 Trigeminal neuralgia: Secondary | ICD-10-CM

## 2019-04-05 MED ORDER — PREGABALIN 200 MG PO CAPS: 200.0000 mg | ORAL_CAPSULE | Freq: Three times a day (TID) | ORAL | 1 refills | Status: DC

## 2019-04-05 MED ORDER — BACLOFEN 20 MG PO TABS: 20.0000 mg | ORAL_TABLET | Freq: Three times a day (TID) | ORAL | 2 refills | Status: DC

## 2019-04-05 MED ORDER — SULFAMETHOXAZOLE-TRIMETHOPRIM 800-160 MG PO TABS: 1.0000 | ORAL_TABLET | Freq: Two times a day (BID) | ORAL | 0 refills | Status: AC

## 2019-04-05 MED ORDER — LISINOPRIL 20 MG PO TABS: 20.0000 mg | ORAL_TABLET | ORAL | 1 refills | Status: DC

## 2019-04-05 NOTE — Progress Notes (Signed)
Name: Alison [name]   MRN: [id number]    DOB: 12-Oct-1975   Date:04/05/2019       Progress Note  Subjective  Chief Complaint  Chief Complaint  Patient presents with  . New Patient (Initial Visit)  . Referral    oncology: appt already schedule  . Urinary Tract Infection    1 month states went to [facility] and never called her in anything? symptoms include: buring, cludy, urgency and odor  . Medication Refill    I connected with  [name]  on 04/05/19 at 12:40 PM EDT by a video enabled telemedicine application and verified that I am speaking with the correct person using two identifiers.  I discussed the limitations of evaluation and management by telemedicine and the availability of in person appointments. The patient expressed understanding and agreed to proceed. Staff also discussed with the patient that there may be a patient responsible charge related to this service. Patient Location: Home Provider Location: Office Additional Individuals present: None  HPI  FMS/SLE/DDD: She was diagnosed with FMS for about 10 years, SLE for about 17 years.  She states she has likely had SLE since age 31. Taking lyrica - needs refill.   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  She was ill at age 21 and had a liver transplant at age 43.  She has blood work every 3 months, but has never had any rejection issues.  She is waiting on PET scan 04/07/2019, then plan is for biopsy.  She states that her chronic pain is related to chemo-related damage.  She is taking oxycodone and dilaudid.   Umbilical Hernia: She notes multiple repairs in the past, she denies tenderness, redness, change in color or drainage.  Does not want to see anyone right now for this.  HTN: Taking lisinopril 41m daily; usually runs 120's/70's at home. No chest pain, shortness of breath, BLE edema, or headaches.    Possible LEFT Plantar  Fasciitis: Present for about 1 month, pain on anterior heel with ambulation, feels like there is a knot there when walking, but nothing is palpable.  Does not tenderness over the area.  Massage has seemed to help some.   UTI: She notes ongoing urinary frequency, dysuria, foul smelling cloudy urine.  [facility] but never received ABX.  Notes history of UTI's in the past.  No abnormal back/flank/abdominal pain. No blood in urine.  Does have history of nephrolithiasis. We will treat today.  Diabetes mellitus type 2 Checking sugars?  yes How often? A few times a month Range (low to high) over last two weeks:  135 Diet: She avoids sugar and eats low carb, low sugar/diet drinks Last eye exam:  Due - needs referral. Denies: Polyuria, polydipsia, polyphagia, vision changes, or neuropathy. Most recent A1C: No results found for: HGBA1C  We will recheck today. Last CMP Results : is not due for repeat today    Component Value Date/Time   NA 138 03/23/2019 1523   K 4.6 03/23/2019 1523   CL 106 03/23/2019 1523   CO2 23 03/23/2019 1523   GLUCOSE 145 (H) 03/23/2019 1523   BUN 30 (H) 03/23/2019 1523   CREATININE 1.01 (H) 03/23/2019 1523   CALCIUM 8.9 03/23/2019 1523   PROT 7.6 03/23/2019 1523   ALBUMIN 3.9 03/23/2019 1523   AST 30 03/23/2019 1523   ALT 25 03/23/2019 1523  ALKPHOS 56 03/23/2019 1523   BILITOT 0.4 03/23/2019 1523   GFRNONAA >60 03/23/2019 1523   GFRAA >60 03/23/2019 1523  Urine Micro UTD? No Current Medication Management: Diabetic Medications: Metformin; states she thinks she is supposed to be taking novolog, but is unsure of how much she is supposed to be taking.  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  Anxiety: She takes Ativan PRN - taking very seldom.  She is struggling with cancer treatment.   Moved here with her boyfriend because of his job, and she has no family or friends in the area and is undergoing cancer treatment. Will refer to psychiatry.  Trigeminal Neuralgia/DDD: Taking lyrica and doing okay on this. She wants referral to neurology as she was seeing one in [place].  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  Patient Active Problem List   Diagnosis Date Noted  . Fibromyalgia 03/23/2019  . Systemic lupus erythematosus (SLE) in adult ([place]) 03/23/2019  . Essential hypertension 03/23/2019  . Hepatitis 03/23/2019  . Mass of left kidney 03/23/2019  . Diabetes mellitus ([place]) 03/23/2019  . Nausea without vomiting 03/23/2019  . Neuropathy 03/23/2019  . Cancer ([place]) 03/23/2019  . Hyperlipidemia 03/23/2019  . Osteoarthritis 03/23/2019  . Trigeminal neuralgia 03/23/2019  . Chronic renal disease, stage III ([place]) 03/23/2019  . Nephrolithiasis 03/23/2019  . Migraine 03/23/2019  . OSA on CPAP 03/23/2019  . Fibrocystic breast 03/23/2019  . Heart murmur 03/23/2019    Past Surgical History:  Procedure Laterality Date  . BONE MARROW BIOPSY  01/13/2015  . BREAST BIOPSY  12/2014  . BREAST BIOPSY  2011  . BREAST SURGERY    . CHOLECYSTECTOMY    . HERNIA REPAIR    . infusaport    . LIVER TRANSPLANT  12/17/1991  . LUMBAR PUNCTURE    . PORT A CATH INJECTION ([place] [facility])    . tumor removal  2015    Family History  Problem Relation Age of Onset  . Heart disease Mother   . Hypertension Mother   . Cancer - Other Mother   . Heart disease Father   . Hypertension Father   . Diabetes Father   . Parkinson's disease Maternal Grandmother   . Cancer Maternal Aunt   . Cancer Maternal Uncle   . Cancer Maternal Grandfather   . Lupus Paternal Grandmother   . Hypertension Brother     Social History   Socioeconomic History  . Marital status: Single    Spouse name: Not on file  . Number of children: Not on file  . Years of education: 3  . Highest education level: Not on  file  Occupational History  . Occupation: disabled  Social Needs  . Financial resource strain: Hard  . Food insecurity    Worry: Often true    Inability: Often true  . Transportation needs    Medical: No    Non-medical: No  Tobacco Use  . Smoking status: Never Smoker  . Smokeless tobacco: Never Used  Substance and Sexual Activity  . Alcohol use: Never    Frequency: Never  . Drug use: Not Currently    Types: Marijuana  . Sexual activity: Not Currently  Lifestyle  . Physical activity    Days per week: 7 days    Minutes per session: 20 min  . Stress: To some extent  Relationships  .  Social connections    Talks on phone: Once a week    Gets together: Three times a week    Attends religious service: More than 4 times per year    Active member of club or organization: No    Attends meetings of clubs or organizations: Never    Relationship status: Not on file  . Intimate partner violence    Fear of current or ex partner: No    Emotionally abused: No    Physically abused: No    Forced sexual activity: No  Other Topics Concern  . Not on file  Social History Narrative  . Not on file     Current Outpatient Medications:  .  baclofen (LIORESAL) 20 MG tablet, Take 1 tablet by mouth 3 (three) times daily., Disp: , Rfl:  .  Cranberry 125 MG TABS, Take 1 tablet by mouth 1 day or 1 dose., Disp: , Rfl:  .  diphenhydrAMINE (BENADRYL) 50 MG capsule, Take 50 mg by mouth every 6 (six) hours as needed., Disp: , Rfl:  .  HYDROmorphone (DILAUDID) 4 MG tablet, Take 1 tablet by mouth every 6 (six) hours as needed., Disp: , Rfl:  .  lisinopril (ZESTRIL) 20 MG tablet, Take 1 tablet by mouth 1 day or 1 dose., Disp: , Rfl:  .  LORazepam (ATIVAN) 1 MG tablet, Take 1 tablet by mouth at bedtime as needed., Disp: , Rfl:  .  metFORMIN (GLUCOPHAGE) 1000 MG tablet, Take 1 tablet by mouth 2 (two) times a day., Disp: , Rfl:  .  mycophenolate (CELLCEPT) 250 MG capsule, Take 1 capsule by mouth 2 (two)  times a day., Disp: , Rfl:  .  oxycodone (ROXICODONE) 30 MG immediate release tablet, Take 1 tablet by mouth every 4 (four) hours as needed., Disp: , Rfl:  .  pregabalin (LYRICA) 200 MG capsule, Take 1 capsule (200 mg total) by mouth 3 (three) times daily for 30 days., Disp: 100 capsule, Rfl: 0 .  promethazine (PHENERGAN) 25 MG tablet, Take 1 tablet by mouth as needed., Disp: , Rfl:  .  tacrolimus (PROGRAF) 1 MG capsule, Take 1 capsule by mouth 1 day or 1 dose., Disp: , Rfl:   Allergies  Allergen Reactions  . Penicillin G Itching and Rash    I personally reviewed active problem list, medication list, allergies, health maintenance, notes from last encounter, lab results with the patient/caregiver today.   ROS  Constitutional: Negative for fever or weight change.  Respiratory: Negative for cough and shortness of breath.   Cardiovascular: Negative for chest pain or palpitations.  Gastrointestinal: Negative for abdominal pain, no bowel changes.  Musculoskeletal: Negative for gait problem or joint swelling.  Skin: Negative for rash.  Neurological: Negative for dizziness or headache.  No other specific complaints in a complete review of systems (except as listed in HPI above).  Objective  Virtual encounter, vitals not obtained.  There is no height or weight on file to calculate BMI.  Physical Exam  Constitutional: Patient appears well-developed and well-nourished. No distress.  HENT: Head: Normocephalic and atraumatic.  Neck: Normal range of motion. Pulmonary/Chest: Effort normal. No respiratory distress. Speaking in complete sentences Neurological: Pt is alert and oriented to person, place, and time. Coordination, speech and gait are normal.  Psychiatric: Patient has a normal mood and affect. behavior is normal. Judgment and thought content normal.  No results found for this or any previous visit (from the past 72 hour(s)).  PHQ2/9: Depression screen [facility] 2/9 04/05/2019  Decreased Interest 3  Down, Depressed, Hopeless 3  PHQ - 2 Score 6  Altered sleeping 3  Tired, decreased energy 3  Change in appetite 1  Feeling bad or failure about yourself  2  Trouble concentrating 2  Moving slowly or fidgety/restless 1  Suicidal thoughts 0  PHQ-9 Score 18  Difficult doing work/chores Somewhat difficult   PHQ-2/9 Result is positive.    Fall Risk: Fall Risk  04/05/2019  Falls in the past year? 1  Number falls in past yr: 1  Injury with Fall? 0    Assessment & Plan  1. Fibromyalgia - pregabalin (LYRICA) 200 MG capsule; Take 1 capsule (200 mg total) by mouth 3 (three) times daily for 30 days.  Dispense: 90 capsule; Refill: 1 - Ambulatory referral to Pain Clinic  2. Systemic lupus erythematosus (SLE) in adult ([place]) - pregabalin (LYRICA) 200 MG capsule; Take 1 capsule (200 mg total) by mouth 3 (three) times daily for 30 days.  Dispense: 90 capsule; Refill: 1 - Ambulatory referral to Pain Clinic  3. Essential hypertension - DASH diet, doing well - lisinopril (ZESTRIL) 20 MG tablet; Take 1 tablet (20 mg total) by mouth 1 day or 1 dose.  Dispense: 90 tablet; Refill: 1  4. History of liver transplant ([place]) - Continue medications  5. Chronic renal disease, stage III ([place]) - Continue with [facility] - has upcoming appt with Dr. Rolly Salter  6. Pain of left heel - Ambulatory referral to Podiatry  7. Umbilical hernia without obstruction and without gangrene - Declines surgical referral today  8. Type 2 diabetes mellitus with hyperglycemia, without long-term current use of insulin ([facility]) - lisinopril (ZESTRIL) 20 MG tablet; Take 1 tablet (20 mg total) by mouth 1 day or 1 dose.  Dispense: 90 tablet; Refill: 1 - Ambulatory referral to Ophthalmology - Ambulatory referral to Endocrinology - Hemoglobin A1c - Microalbumin / creatinine urine ratio  9. Pineal gland cyst - Ambulatory referral to Endocrinology  10. Lymphoma, unspecified body region,  unspecified lymphoma type [facility]) - Ambulatory referral to Pain Clinic - She is seeing oncology - continue follow up on this.  11. Degenerative disc disease, cervical - Ambulatory referral to Pain Clinic  12. Lipid screening - Lipid panel  13. Anxiety and depression - Ambulatory referral to Psychiatry  14. Bipolar 2 disorder ([place]) - Ambulatory referral to Psychiatry  15. Trigeminal neuralgia - Ambulatory referral to Neurology  16. Neuropathy - Ambulatory referral to Neurology  17. Other migraine without status migrainosus, not intractable - Ambulatory referral to Neurology  I discussed the assessment and treatment plan with the patient. The patient was provided an opportunity to ask questions and all were answered. The patient agreed with the plan and demonstrated an understanding of the instructions.  The patient was advised to call back or seek an in-person evaluation if the symptoms worsen or if the condition fails to improve as anticipated.  I provided 38 minutes of non-face-to-face time during this encounter.

## 2019-04-07 ENCOUNTER — Encounter
Admission: RE | Admit: 2019-04-07 | Discharge: 2019-04-07 | Disposition: A | Discharge status: Home / Self Care | Payer: Medicaid Other | Source: Ambulatory Visit | Attending: Oncology | Admitting: Oncology

## 2019-04-07 ENCOUNTER — Other Ambulatory Visit: Payer: Self-pay

## 2019-04-07 DIAGNOSIS — R9389 Abnormal findings on diagnostic imaging of other specified body structures: Secondary | ICD-10-CM | POA: Diagnosis not present

## 2019-04-07 LAB — GLUCOSE, CAPILLARY: Glucose-Capillary: 124 mg/dL — ABNORMAL HIGH (ref 70–99)

## 2019-04-07 MED ORDER — FLUDEOXYGLUCOSE F - 18 (FDG) INJECTION
12.9000 | Freq: Once | INTRAVENOUS | Status: AC | PRN
  Administered 2019-04-07: 12:00:00 12.84 via INTRAVENOUS

## 2019-04-08 ENCOUNTER — Encounter: Payer: Self-pay | Admitting: Oncology

## 2019-04-12 ENCOUNTER — Other Ambulatory Visit: Payer: Self-pay

## 2019-04-12 ENCOUNTER — Encounter: Payer: Self-pay | Admitting: Oncology

## 2019-04-12 ENCOUNTER — Telehealth: Payer: Self-pay

## 2019-04-12 ENCOUNTER — Inpatient Hospital Stay: Payer: Medicaid Other | Attending: Oncology | Admitting: Oncology

## 2019-04-12 VITALS — BP 121/83 | HR 67 | Temp 98.0°F | Resp 18 | Wt 255.5 lb

## 2019-04-12 DIAGNOSIS — I129 Hypertensive chronic kidney disease with stage 1 through stage 4 chronic kidney disease, or unspecified chronic kidney disease: Secondary | ICD-10-CM | POA: Diagnosis not present

## 2019-04-12 DIAGNOSIS — G8929 Other chronic pain: Secondary | ICD-10-CM | POA: Insufficient documentation

## 2019-04-12 DIAGNOSIS — Z08 Encounter for follow-up examination after completed treatment for malignant neoplasm: Secondary | ICD-10-CM

## 2019-04-12 DIAGNOSIS — M549 Dorsalgia, unspecified: Secondary | ICD-10-CM | POA: Diagnosis not present

## 2019-04-12 DIAGNOSIS — Z944 Liver transplant status: Secondary | ICD-10-CM | POA: Insufficient documentation

## 2019-04-12 DIAGNOSIS — Z9221 Personal history of antineoplastic chemotherapy: Secondary | ICD-10-CM | POA: Diagnosis not present

## 2019-04-12 DIAGNOSIS — Z7984 Long term (current) use of oral hypoglycemic drugs: Secondary | ICD-10-CM | POA: Insufficient documentation

## 2019-04-12 DIAGNOSIS — C8331 Diffuse large B-cell lymphoma, lymph nodes of head, face, and neck: Secondary | ICD-10-CM | POA: Diagnosis not present

## 2019-04-12 DIAGNOSIS — Z79899 Other long term (current) drug therapy: Secondary | ICD-10-CM | POA: Diagnosis not present

## 2019-04-12 DIAGNOSIS — N183 Chronic kidney disease, stage 3 (moderate): Secondary | ICD-10-CM | POA: Insufficient documentation

## 2019-04-12 DIAGNOSIS — M79672 Pain in left foot: Secondary | ICD-10-CM | POA: Insufficient documentation

## 2019-04-12 DIAGNOSIS — D490 Neoplasm of unspecified behavior of digestive system: Secondary | ICD-10-CM

## 2019-04-12 DIAGNOSIS — K754 Autoimmune hepatitis: Secondary | ICD-10-CM

## 2019-04-12 DIAGNOSIS — Z8579 Personal history of other malignant neoplasms of lymphoid, hematopoietic and related tissues: Secondary | ICD-10-CM

## 2019-04-12 NOTE — Progress Notes (Signed)
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  She is currently on CellCept and tacrolimus for the same.  She also has a history of post transplant lymphoproliferative disorder/DLBCL that was diagnosed in 2016.  She is s/p 6 cycles of R-CHOP chemotherapy back then and was in complete remission 1.  Her other past medical history significant for migraines, stage III chronic kidney disease, chemo-induced peripheral neuropathy, hypertension among other medical problems.  With regards to her diffuse large B-cell lymphoma she was getting surveillance scans and this was all done in [place].  She has now moved to [facility] to be with her significant other.  Patient was noted to have a prior kidney mass before which was biopsied and was not consistent with malignancy.  She underwent CT neck with contrast before she left [place] on 02/22/2019.  She was noted to have soft tissue masses in both the parotid glands which were new as compared to prior exams and possibly represent lymph nodes.  The largest one was seen in the left parotid gland measuring 1.3 x 1.1 cm.  Before she could get a work-up for this patient moved to [place] and has not had any further work-up yet  Her other prior imaging is as follows: MRI thoracic spine without contrast in August 2019 showed moderate multilevel spondylosis but no evidence of lymphoma.  Multinodular thyroid in June 2019.  MRI cervical spine May 2019 again showed  spondylosis but no other acute pathology.  MRI brain showed incidental partial opacification of the right and right sinus.  No evidence of malignancy.  I do not have any other PET CT scan or treatment records from the past.   Interval history-reports having pain in her left foot which is worse when she wakes up and then slowly start getting better after she ambulates.  She also has chronic back pain for which she is on Dilaudid and oxycodone.  ECOG PS- 1 Pain scale- 6 Opioid associated constipation- no  Review of systems- Review of Systems  Constitutional: Negative for chills, fever, malaise/fatigue and weight loss.  HENT: Negative for congestion, ear discharge and nosebleeds.   Eyes: Negative for blurred vision.  Respiratory: Negative for cough, hemoptysis, sputum production, shortness of breath and wheezing.   Cardiovascular: Negative for chest pain, palpitations, orthopnea and claudication.  Gastrointestinal: Negative for abdominal pain, blood in stool, constipation, diarrhea, heartburn, melena, nausea and vomiting.  Genitourinary: Negative for dysuria, flank pain, frequency, hematuria and urgency.  Musculoskeletal: Positive for back pain. Negative for myalgias.  Skin: Negative for rash.  Neurological: Negative for dizziness, tingling, focal weakness, seizures, weakness and headaches.  Endo/Heme/Allergies: Does not bruise/bleed easily.  Psychiatric/Behavioral: Negative for depression and suicidal ideas. The patient does not have insomnia.       Allergies  Allergen Reactions  . Penicillin G Itching and Rash     Past Medical History:  Diagnosis Date  . Abnormal uterine bleeding   . Bipolar disorder (manic depression) ([place])   . Cancer ([place])   . Chronic kidney failure   .  Chronic renal disease, stage III ([place])   . Diabetes mellitus without complication ([place])   . 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[place])   . FH: trigeminal neuralgia   . Hypertension   . Kidney mass   .  Lupus ([place])   . Lupus ([place])   . Lymphoma ([place])   . Major depressive disorder   . Migraine   . Morbid obesity ([place])   . Neuropathy   . Post herpetic neuralgia   . Renal disorder   . S/P liver transplant [facility])      Past Surgical History:  Procedure Laterality Date  . BONE MARROW BIOPSY  01/13/2015  . BREAST BIOPSY  12/2014  . BREAST BIOPSY  2011  . BREAST SURGERY    . CHOLECYSTECTOMY    . HERNIA REPAIR    . infusaport    . LIVER TRANSPLANT  12/17/1991  . LUMBAR PUNCTURE    . PORT A CATH INJECTION ([place] HX)    . tumor removal  2015    Social History   Socioeconomic History  . Marital status: Single    Spouse name: Not on file  . Number of children: Not on file  . Years of education: 44  . Highest education level: Not on file  Occupational History  . Occupation: disabled  Social Needs  . Financial resource strain: Hard  . Food insecurity    Worry: Often true    Inability: Often true  . Transportation needs    Medical: No    Non-medical: No  Tobacco Use  . Smoking status: Never Smoker  . Smokeless tobacco: Never Used  Substance and Sexual Activity  . Alcohol use: Never    Frequency: Never  . Drug use: Not Currently    Types: Marijuana  . Sexual activity: Not Currently  Lifestyle  . Physical activity    Days per week: 7 days    Minutes per session: 20 min  . Stress: To some extent  Relationships  . Social connections    Talks on phone: Once a week    Gets together: Three times a week    Attends religious service: More than 4 times per year    Active member of club or organization: No    Attends meetings of clubs or organizations: Never    Relationship status: Not on file  . Intimate partner violence    Fear of current or ex partner: No    Emotionally abused: No    Physically abused: No    Forced sexual activity: No  Other Topics Concern  . Not on file  Social History Narrative  . Not on file    Family History  Problem Relation Age of Onset  .  Heart disease Mother   . Hypertension Mother   . Cancer - Other Mother   . Heart disease Father   . Hypertension Father   . Diabetes Father   . Parkinson's disease Maternal Grandmother   . Cancer Maternal Aunt   . Cancer Maternal Uncle   . Cancer Maternal Grandfather   . Lupus Paternal Grandmother   . Hypertension Brother      Current Outpatient Medications:  .  baclofen (LIORESAL) 20 MG tablet, Take 1 tablet (20 mg total) by mouth 3 (three) times daily., Disp: 30 each, Rfl: 2 .  Cranberry 125 MG TABS, Take 1 tablet by mouth 1 day or 1 dose., Disp: , Rfl:  .  diphenhydrAMINE (BENADRYL) 50 MG capsule, Take 50 mg by  mouth every 6 (six) hours as needed., Disp: , Rfl:  .  HYDROmorphone (DILAUDID) 4 MG tablet, Take 1 tablet by mouth every 6 (six) hours as needed., Disp: , Rfl:  .  lisinopril (ZESTRIL) 20 MG tablet, Take 1 tablet (20 mg total) by mouth 1 day or 1 dose., Disp: 90 tablet, Rfl: 1 .  LORazepam (ATIVAN) 1 MG tablet, Take 1 tablet by mouth at bedtime as needed., Disp: , Rfl:  .  metFORMIN (GLUCOPHAGE) 1000 MG tablet, Take 1 tablet by mouth 2 (two) times a day., Disp: , Rfl:  .  mycophenolate (CELLCEPT) 250 MG capsule, Take 1 capsule by mouth 2 (two) times a day., Disp: , Rfl:  .  oxycodone (ROXICODONE) 30 MG immediate release tablet, Take 1 tablet by mouth every 4 (four) hours as needed., Disp: , Rfl:  .  [START ON 04/16/2019] pregabalin (LYRICA) 200 MG capsule, Take 1 capsule (200 mg total) by mouth 3 (three) times daily for 30 days., Disp: 90 capsule, Rfl: 1 .  promethazine (PHENERGAN) 25 MG tablet, Take 1 tablet by mouth as needed., Disp: , Rfl:  .  tacrolimus (PROGRAF) 1 MG capsule, Take 1 capsule by mouth 1 day or 1 dose., Disp: , Rfl:   Physical exam:  Vitals:   04/12/19 1306  BP: 121/83  Pulse: 67  Resp: 18  Temp: 98 F (36.7 C)  Weight: 255 lb 8 oz (115.9 kg)   Physical Exam HENT:     Head: Normocephalic and atraumatic.  Eyes:     Pupils: Pupils are equal,  round, and reactive to light.  Neck:     Musculoskeletal: Normal range of motion.  Cardiovascular:     Rate and Rhythm: Normal rate and regular rhythm.     Heart sounds: Normal heart sounds.  Pulmonary:     Effort: Pulmonary effort is normal.     Breath sounds: Normal breath sounds.  Abdominal:     General: Bowel sounds are normal.     Palpations: Abdomen is soft.  Skin:    General: Skin is warm and dry.  Neurological:     Mental Status: She is alert and oriented to person, place, and time.      CMP Latest Ref Rng & Units 03/23/2019  Glucose 70 - 99 mg/dL 145(H)  BUN 6 - 20 mg/dL 30(H)  Creatinine 0.44 - 1.00 mg/dL 1.01(H)  Sodium 135 - 145 mmol/L 138  Potassium 3.5 - 5.1 mmol/L 4.6  Chloride 98 - 111 mmol/L 106  CO2 22 - 32 mmol/L 23  Calcium 8.9 - 10.3 mg/dL 8.9  Total Protein 6.5 - 8.1 g/dL 7.6  Total Bilirubin 0.3 - 1.2 mg/dL 0.4  Alkaline Phos 38 - 126 U/L 56  AST 15 - 41 U/L 30  ALT 0 - 44 U/L 25   CBC Latest Ref Rng & Units 03/23/2019  WBC 4.0 - 10.5 K/uL 6.2  Hemoglobin 12.0 - 15.0 g/dL 12.9  Hematocrit 36.0 - 46.0 % 39.8  Platelets 150 - 400 K/uL 208    No images are attached to the encounter.  Nm Pet Image Restage (ps) Whole Body  Result Date: 04/07/2019 CLINICAL DATA:  Initial treatment strategy for lymphoma. Liver transplant. Parotid lesions on outside CT scan. EXAM: NUCLEAR MEDICINE PET WHOLE BODY TECHNIQUE: 12.8 mCi F-18 FDG was injected intravenously. Full-ring PET imaging was performed from the skull base to thigh after the radiotracer. CT data was obtained and used for attenuation correction and anatomic localization. Fasting blood glucose:  124 mg/dl COMPARISON:  None available FINDINGS: Mediastinal blood pool activity: SUV max 3.94 HEAD/NECK: Single hypermetabolic nodules within the LEFT and RIGHT parotid gland. Lesion in the LEFT parotid gland measures 1.1 cm with SUV max equal 6.3. Lesion in the RIGHT parotid gland measures 0.8 cm with SUV max equals 5.5.  These rounded lesions are higher density than the background parotid tissue and are typical of primary parotid neoplasms. Mild asymmetry metabolic activity on the LEFT hypopharynx compared to the RIGHT. No hypermetabolic lymph nodes in the neck. Incidental CT findings: none CHEST: No hypermetabolic mediastinal or hilar nodes. No suspicious pulmonary nodules on the CT scan. Incidental CT findings: Port in the anterior chest wall with tip in distal SVC. ABDOMEN/PELVIS: No abnormal hypermetabolic activity within the liver, pancreas, adrenal glands, or spleen. No hypermetabolic lymph nodes in the abdomen or pelvis. Normal volume spleen.  Postcholecystectomy.  Uterus normal. Incidental CT findings: none SKELETON: No focal hypermetabolic activity to suggest skeletal metastasis. Incidental CT findings: none EXTREMITIES: No abnormal hypermetabolic activity in the lower extremities. Incidental CT findings: none IMPRESSION: 1. Single bilateral small round hypermetabolic nodular lesions in the LEFT and RIGHT parotid gland. Favor primary parotid neoplasms over lymphoma. Recommend ENT consultation. 2. No evidence of hypermetabolic adenopathy on whole-body scan. 3. Normal spleen and bone marrow. Electronically Signed   By: Suzy Bouchard M.D.   On: 04/07/2019 16:31     Assessment and plan- Patient is a 43 y.o. female with h/o stage III CKD, liver transplant on immunosuppression and h/o dlbcl s/p ? RCHOP chemotherapy in 2016.  She is here to discuss PET CT scan results and further management  I have reviewed PET/CT scan images independently and discussed findings with the patient Patient noted to have bilateral parotid lesions 1.1 cm and 0.8 cm left and right respectively with an SUV of 6.3 and 5.5.  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  No hypermetabolism seen elsewhere.  Her CBC and CMP are unremarkable.  At this point I do not see any evidence of lymphoma recurrence.  As such patient  does not require any surveillance imaging for her lymphoma and I will see her back in 6 months time for routine history and physical.  Given that she has history of liver transplant and is on immunosuppression we could potentially consider doing CT scan or PET scan once a year  Given mild bilateral parotid gland hyper metabolism-I will refer him to ENT at this time for further work-up.  I will see her sooner if there is evidence of lymphoma involving the parotid gland  I will see her back in 6 months with a CBC with differential, CMP and LDH  Patient has chronic pain for which she is on Dilaudid and oxycodone that was prescribed to her previously.  She is asking me for pain medication refills and I mentioned to her that I would not be able to refill her pain medications as she does not have any evidence of active malignancy.  She will need to discuss this with her primary care doctor   Visit Diagnosis 1. Encounter for follow-up surveillance of diffuse large B-cell lymphoma   2. Parotid neoplasm      Dr. Randa Evens, MD, MPH [facility] [phone] 04/12/2019 12:54 PM

## 2019-04-12 NOTE — Telephone Encounter (Signed)
Records faxed for new patient referral.

## 2019-04-12 NOTE — Progress Notes (Signed)
Her to discuss test results.  She is requesting refills on pain medication.  The oncologist in [place] was the MD that started the pain medication Oxycodone and Dilaudid.  It has been a little over a month since filled in [place].  Her pain is 6/10 today in lower back due to DDD and Fibromyalgia.   She has established care with PCP Raelyn Ensign, NP at [facility].

## 2019-04-14 ENCOUNTER — Encounter: Payer: Self-pay | Admitting: Family Medicine

## 2019-04-16 ENCOUNTER — Encounter: Payer: Self-pay | Admitting: Family Medicine

## 2019-04-16 ENCOUNTER — Ambulatory Visit: Payer: Medicaid Other | Admitting: Oncology

## 2019-04-16 DIAGNOSIS — D37030 Neoplasm of uncertain behavior of the parotid salivary glands: Secondary | ICD-10-CM | POA: Diagnosis not present

## 2019-04-19 ENCOUNTER — Encounter: Payer: Self-pay | Admitting: Family Medicine

## 2019-04-23 ENCOUNTER — Other Ambulatory Visit (HOSPITAL_COMMUNITY): Payer: Self-pay | Admitting: Otolaryngology

## 2019-04-23 ENCOUNTER — Encounter: Payer: Self-pay | Admitting: Student in an Organized Health Care Education/Training Program

## 2019-04-23 ENCOUNTER — Other Ambulatory Visit: Payer: Self-pay | Admitting: Otolaryngology

## 2019-04-23 DIAGNOSIS — R221 Localized swelling, mass and lump, neck: Secondary | ICD-10-CM

## 2019-04-25 ENCOUNTER — Other Ambulatory Visit: Payer: Self-pay | Admitting: Family Medicine

## 2019-04-25 DIAGNOSIS — M329 Systemic lupus erythematosus, unspecified: Secondary | ICD-10-CM

## 2019-04-25 DIAGNOSIS — M797 Fibromyalgia: Secondary | ICD-10-CM

## 2019-04-26 ENCOUNTER — Other Ambulatory Visit: Payer: Self-pay | Admitting: Otolaryngology

## 2019-04-26 DIAGNOSIS — K118 Other diseases of salivary glands: Secondary | ICD-10-CM

## 2019-04-26 NOTE — Telephone Encounter (Signed)
Alison [name] - I sent this refill in about a month ago, pharmacy is sending back now stating a PA is needed.  Please clarify and complete any PA that is needed.

## 2019-04-27 ENCOUNTER — Ambulatory Visit: Payer: Medicaid Other

## 2019-04-27 DIAGNOSIS — M722 Plantar fascial fibromatosis: Secondary | ICD-10-CM | POA: Diagnosis not present

## 2019-04-29 ENCOUNTER — Encounter: Payer: Self-pay | Admitting: Oncology

## 2019-04-30 DIAGNOSIS — E119 Type 2 diabetes mellitus without complications: Secondary | ICD-10-CM | POA: Diagnosis not present

## 2019-04-30 DIAGNOSIS — Z794 Long term (current) use of insulin: Secondary | ICD-10-CM | POA: Diagnosis not present

## 2019-05-03 ENCOUNTER — Other Ambulatory Visit: Payer: Self-pay | Admitting: Student

## 2019-05-04 ENCOUNTER — Other Ambulatory Visit: Payer: Self-pay

## 2019-05-04 ENCOUNTER — Other Ambulatory Visit: Payer: Self-pay | Admitting: Otolaryngology

## 2019-05-04 ENCOUNTER — Ambulatory Visit
Admission: RE | Admit: 2019-05-04 | Discharge: 2019-05-04 | Disposition: A | Discharge status: Home / Self Care | Payer: Medicaid Other | Source: Ambulatory Visit | Attending: Otolaryngology | Admitting: Otolaryngology

## 2019-05-04 DIAGNOSIS — Z833 Family history of diabetes mellitus: Secondary | ICD-10-CM | POA: Insufficient documentation

## 2019-05-04 DIAGNOSIS — K118 Other diseases of salivary glands: Secondary | ICD-10-CM

## 2019-05-04 DIAGNOSIS — Z7984 Long term (current) use of oral hypoglycemic drugs: Secondary | ICD-10-CM | POA: Insufficient documentation

## 2019-05-04 DIAGNOSIS — I129 Hypertensive chronic kidney disease with stage 1 through stage 4 chronic kidney disease, or unspecified chronic kidney disease: Secondary | ICD-10-CM | POA: Diagnosis not present

## 2019-05-04 DIAGNOSIS — N183 Chronic kidney disease, stage 3 (moderate): Secondary | ICD-10-CM | POA: Insufficient documentation

## 2019-05-04 DIAGNOSIS — K119 Disease of salivary gland, unspecified: Secondary | ICD-10-CM | POA: Diagnosis not present

## 2019-05-04 DIAGNOSIS — Z8572 Personal history of non-Hodgkin lymphomas: Secondary | ICD-10-CM | POA: Diagnosis not present

## 2019-05-04 DIAGNOSIS — Z944 Liver transplant status: Secondary | ICD-10-CM | POA: Insufficient documentation

## 2019-05-04 DIAGNOSIS — F319 Bipolar disorder, unspecified: Secondary | ICD-10-CM | POA: Insufficient documentation

## 2019-05-04 DIAGNOSIS — Z79899 Other long term (current) drug therapy: Secondary | ICD-10-CM | POA: Diagnosis not present

## 2019-05-04 DIAGNOSIS — Z8249 Family history of ischemic heart disease and other diseases of the circulatory system: Secondary | ICD-10-CM | POA: Diagnosis not present

## 2019-05-04 DIAGNOSIS — R221 Localized swelling, mass and lump, neck: Secondary | ICD-10-CM

## 2019-05-04 DIAGNOSIS — E1122 Type 2 diabetes mellitus with diabetic chronic kidney disease: Secondary | ICD-10-CM | POA: Diagnosis not present

## 2019-05-04 DIAGNOSIS — C8330 Diffuse large B-cell lymphoma, unspecified site: Secondary | ICD-10-CM | POA: Diagnosis not present

## 2019-05-04 DIAGNOSIS — R590 Localized enlarged lymph nodes: Secondary | ICD-10-CM | POA: Diagnosis not present

## 2019-05-04 DIAGNOSIS — C884 Extranodal marginal zone B-cell lymphoma of mucosa-associated lymphoid tissue [MALT-lymphoma]: Secondary | ICD-10-CM | POA: Diagnosis not present

## 2019-05-04 DIAGNOSIS — R569 Unspecified convulsions: Secondary | ICD-10-CM | POA: Diagnosis not present

## 2019-05-04 LAB — GLUCOSE, CAPILLARY: Glucose-Capillary: 118 mg/dL — ABNORMAL HIGH (ref 70–99)

## 2019-05-04 MED ORDER — MIDAZOLAM HCL 5 MG/5ML IJ SOLN
INTRAMUSCULAR | Status: AC | PRN
  Administered 2019-05-04 (×3): 1 mg via INTRAVENOUS

## 2019-05-04 MED ORDER — FENTANYL CITRATE (PF) 100 MCG/2ML IJ SOLN
INTRAMUSCULAR | Status: AC | PRN
  Administered 2019-05-04 (×2): 50 ug via INTRAVENOUS

## 2019-05-04 MED ORDER — SODIUM CHLORIDE 0.9 % IV SOLN
INTRAVENOUS | Status: DC
  Administered 2019-05-04: 1000 mL via INTRAVENOUS

## 2019-05-04 MED ORDER — FENTANYL CITRATE (PF) 100 MCG/2ML IJ SOLN
INTRAMUSCULAR | Status: AC
  Filled 2019-05-04: qty 2

## 2019-05-04 MED ORDER — MIDAZOLAM HCL 5 MG/5ML IJ SOLN
INTRAMUSCULAR | Status: AC
  Filled 2019-05-04: qty 5

## 2019-05-04 NOTE — Procedures (Signed)
Pre Procedure Dx: Bilateral parotid nodules Post Procedural Dx: Same  Technically successful US guided biopsy of indeterminate bilateral parotid nodules.   EBL: None  No immediate complications.   Ronny Bacon, MD Pager #: [phone]

## 2019-05-04 NOTE — Sedation Documentation (Signed)
Total conscious sedation: Versed 3 mg IV, Fentanyl 100 mcg IV. Pt. Tolerated procedure well.

## 2019-05-04 NOTE — Consult Note (Signed)
Chief Complaint: Bilateral parotid nodules  Referring Physician(s): Vaught,Creighton  Patient Status: [facility] - Out-pt  History of Present Illness: Alison [name] is a 43 y.o. female with past medical history significant for bipolar disorder, lymphoma, chronic renal disease, diabetes, diffuse large B-cell lymphoma, hypertension and remote history of liver transplantation who presents today for ultrasound-guided biopsy of indeterminate hypermetabolic bilateral parotid nodules.  Patient is in baseline state of health and is without acute complaint.  Past Medical History:  Diagnosis Date  . Abnormal uterine bleeding   . Bipolar disorder (manic depression) ([place])   . Cancer ([place])   . Chronic kidney failure   . Chronic renal disease, stage III ([place])   . Diabetes mellitus without complication ([place])   . 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[place])   . FH: trigeminal neuralgia   . Hypertension   . Kidney mass   . Lupus ([place])   . Lupus ([place])   . Lymphoma ([place])   . Major depressive disorder   . Migraine   . Morbid obesity ([place])   . Neuropathy   . Post herpetic neuralgia   . Renal disorder   . S/P liver transplant [facility])     Past Surgical History:  Procedure Laterality Date  . BONE MARROW BIOPSY  01/13/2015  . BREAST BIOPSY  12/2014  . BREAST BIOPSY  2011  . BREAST SURGERY    . CHOLECYSTECTOMY    . HERNIA REPAIR    . infusaport    . LIVER TRANSPLANT  12/17/1991  . LUMBAR PUNCTURE    . PORT A CATH INJECTION ([place] HX)    . tumor removal  2015    Allergies: Penicillin g  Medications: Prior to Admission medications   Medication Sig Start Date End Date Taking? Authorizing Provider  baclofen (LIORESAL) 20 MG tablet Take 1 tablet (20 mg total) by mouth 3 (three) times daily. 04/05/19   [name], FNP  clonazePAM (KLONOPIN) 0.5 MG tablet Take 0.5 mg by mouth at bedtime as needed for anxiety.    [provider]  Cranberry 125 MG TABS Take 1 tablet by mouth 1 day  or 1 dose.    [provider]  diphenhydrAMINE (BENADRYL) 50 MG capsule Take 50 mg by mouth every 6 (six) hours as needed.    [provider]  HYDROmorphone (DILAUDID) 4 MG tablet Take 1 tablet by mouth every 6 (six) hours as needed.    [provider]  lisinopril (ZESTRIL) 20 MG tablet Take 1 tablet (20 mg total) by mouth 1 day or 1 dose. 04/05/19   [name], FNP  LORazepam (ATIVAN) 1 MG tablet Take 1 tablet by mouth at bedtime as needed.    [provider]  metFORMIN (GLUCOPHAGE) 1000 MG tablet Take 1 tablet by mouth 2 (two) times a day.    [provider]  mycophenolate (CELLCEPT) 250 MG capsule Take 1 capsule by mouth 2 (two) times a day.    [provider]  oxycodone (ROXICODONE) 30 MG immediate release tablet Take 1 tablet by mouth every 4 (four) hours as needed.    [provider]  pregabalin (LYRICA) 200 MG capsule Take 1 capsule (200 mg total) by mouth 3 (three) times daily for 30 days. 04/16/19 05/16/19  [name], FNP  promethazine (PHENERGAN) 25 MG tablet Take 1 tablet by mouth as needed.    [provider]  tacrolimus (PROGRAF) 1 MG capsule Take 1 capsule by mouth 1 day or 1  dose.    [provider]     Family History  Problem Relation Age of Onset  . Heart disease Mother   . Hypertension Mother   . Cancer - Other Mother   . Heart disease Father   . Hypertension Father   . Diabetes Father   . Parkinson's disease Maternal Grandmother   . Cancer Maternal Aunt   . Cancer Maternal Uncle   . Cancer Maternal Grandfather   . Lupus Paternal Grandmother   . Hypertension Brother     Social History   Socioeconomic History  . Marital status: Single    Spouse name: Not on file  . Number of children: Not on file  . Years of education: 4  . Highest education level: Not on file  Occupational History  . Occupation: disabled  Social Needs  . Financial resource strain: Hard  . Food insecurity     Worry: Often true    Inability: Often true  . Transportation needs    Medical: No    Non-medical: No  Tobacco Use  . Smoking status: Never Smoker  . Smokeless tobacco: Never Used  Substance and Sexual Activity  . Alcohol use: Never    Frequency: Never  . Drug use: Not Currently    Types: Marijuana  . Sexual activity: Not Currently  Lifestyle  . Physical activity    Days per week: 7 days    Minutes per session: 20 min  . Stress: To some extent  Relationships  . Social connections    Talks on phone: Once a week    Gets together: Three times a week    Attends religious service: More than 4 times per year    Active member of club or organization: No    Attends meetings of clubs or organizations: Never    Relationship status: Not on file  Other Topics Concern  . Not on file  Social History Narrative  . Not on file    ECOG Status: 0 - Asymptomatic  Review of Systems: A 12 point ROS discussed and pertinent positives are indicated in the HPI above.  All other systems are negative.  Review of Systems  Vital Signs: BP 127/87 (BP Location: Left Arm)   Pulse 71   Resp 18   LMP 02/04/2018 (Approximate) Comment: Per patient, over a year ago  SpO2 100%   Physical Exam Vitals signs and nursing note reviewed.  Constitutional:      Appearance: She is obese.  Psychiatric:        Mood and Affect: Mood normal.        Behavior: Behavior normal.        Thought Content: Thought content normal.        Judgment: Judgment normal.     Imaging: Nm Pet Image Restage (ps) Whole Body  Result Date: 04/07/2019 CLINICAL DATA:  Initial treatment strategy for lymphoma. Liver transplant. Parotid lesions on outside CT scan. EXAM: NUCLEAR MEDICINE PET WHOLE BODY TECHNIQUE: 12.8 mCi F-18 FDG was injected intravenously. Full-ring PET imaging was performed from the skull base to thigh after the radiotracer. CT data was obtained and used for attenuation correction and anatomic localization. Fasting  blood glucose: 124 mg/dl COMPARISON:  None available FINDINGS: Mediastinal blood pool activity: SUV max 3.94 HEAD/NECK: Single hypermetabolic nodules within the LEFT and RIGHT parotid gland. Lesion in the LEFT parotid gland measures 1.1 cm with SUV max equal 6.3. Lesion in the RIGHT parotid gland measures 0.8 cm with SUV max equals  5.5. These rounded lesions are higher density than the background parotid tissue and are typical of primary parotid neoplasms. Mild asymmetry metabolic activity on the LEFT hypopharynx compared to the RIGHT. No hypermetabolic lymph nodes in the neck. Incidental CT findings: none CHEST: No hypermetabolic mediastinal or hilar nodes. No suspicious pulmonary nodules on the CT scan. Incidental CT findings: Port in the anterior chest wall with tip in distal SVC. ABDOMEN/PELVIS: No abnormal hypermetabolic activity within the liver, pancreas, adrenal glands, or spleen. No hypermetabolic lymph nodes in the abdomen or pelvis. Normal volume spleen.  Postcholecystectomy.  Uterus normal. Incidental CT findings: none SKELETON: No focal hypermetabolic activity to suggest skeletal metastasis. Incidental CT findings: none EXTREMITIES: No abnormal hypermetabolic activity in the lower extremities. Incidental CT findings: none IMPRESSION: 1. Single bilateral small round hypermetabolic nodular lesions in the LEFT and RIGHT parotid gland. Favor primary parotid neoplasms over lymphoma. Recommend ENT consultation. 2. No evidence of hypermetabolic adenopathy on whole-body scan. 3. Normal spleen and bone marrow. Electronically Signed   By: Suzy Bouchard M.D.   On: 04/07/2019 16:31    Labs:  CBC: Recent Labs    03/23/19 1523  WBC 6.2  HGB 12.9  HCT 39.8  PLT 208    COAGS: No results for input(s): INR, APTT in the last 8760 hours.  BMP: Recent Labs    03/23/19 1523  NA 138  K 4.6  CL 106  CO2 23  GLUCOSE 145*  BUN 30*  CALCIUM 8.9  CREATININE 1.01*  GFRNONAA >60  GFRAA >60     LIVER FUNCTION TESTS: Recent Labs    03/23/19 1523  BILITOT 0.4  AST 30  ALT 25  ALKPHOS 56  PROT 7.6  ALBUMIN 3.9    TUMOR MARKERS: No results for input(s): AFPTM, CEA, CA199, CHROMGRNA in the last 8760 hours.  Assessment and Plan:  Alison [name] is a 43 y.o. female with past medical history significant for bipolar disorder, lymphoma, chronic renal disease, diabetes, diffuse large B-cell lymphoma, hypertension and remote history of liver transplantation who presents today for ultrasound-guided biopsy of indeterminate hypermetabolic bilateral parotid nodules.  Patient is in baseline state of health and is without acute complaint.  Risks and benefits of ultrasound-guided bilateral parotid nodule biopsy was discussed with the patient and/or patient's family including, but not limited to bleeding, infection, damage to adjacent structures or low yield requiring additional tests.  All of the questions were answered and there is agreement to proceed.  Consent signed and in chart.  Thank you for this interesting consult.  I greatly enjoyed meeting Alison [name] and look forward to participating in their care.  A copy of this report was sent to the requesting provider on this date.  Electronically Signed: Sandi Mariscal, MD 05/04/2019, 11:44 AM   I spent a total of 15 Minutes in face to face in clinical consultation, greater than 50% of which was counseling/coordinating care for ultrasound-guided bilateral parotid nodule biopsy

## 2019-05-04 NOTE — Sedation Documentation (Signed)
Dr. Pascal Lux speaking with pt. Extensively re: parotid biopsies. Pt. Verbalizes understanding.

## 2019-05-10 DIAGNOSIS — M7732 Calcaneal spur, left foot: Secondary | ICD-10-CM | POA: Diagnosis not present

## 2019-05-10 DIAGNOSIS — M722 Plantar fascial fibromatosis: Secondary | ICD-10-CM | POA: Diagnosis not present

## 2019-05-10 DIAGNOSIS — M79672 Pain in left foot: Secondary | ICD-10-CM | POA: Diagnosis not present

## 2019-05-10 DIAGNOSIS — E119 Type 2 diabetes mellitus without complications: Secondary | ICD-10-CM | POA: Diagnosis not present

## 2019-05-11 ENCOUNTER — Encounter: Payer: Self-pay | Admitting: Oncology

## 2019-05-11 LAB — SURGICAL PATHOLOGY

## 2019-05-12 ENCOUNTER — Encounter: Payer: Self-pay | Admitting: Family Medicine

## 2019-05-13 ENCOUNTER — Other Ambulatory Visit: Payer: Self-pay | Admitting: Oncology

## 2019-05-13 DIAGNOSIS — M5481 Occipital neuralgia: Secondary | ICD-10-CM | POA: Diagnosis not present

## 2019-05-13 DIAGNOSIS — G43119 Migraine with aura, intractable, without status migrainosus: Secondary | ICD-10-CM | POA: Diagnosis not present

## 2019-05-13 DIAGNOSIS — G500 Trigeminal neuralgia: Secondary | ICD-10-CM | POA: Diagnosis not present

## 2019-05-13 DIAGNOSIS — R200 Anesthesia of skin: Secondary | ICD-10-CM | POA: Diagnosis not present

## 2019-05-17 ENCOUNTER — Encounter: Payer: Self-pay | Admitting: *Deleted

## 2019-05-17 NOTE — Progress Notes (Deleted)
Patient's Name: Alison [name]  MRN: [id number]  Referring Provider: [name], FNP  DOB: 08-Sep-1976  PCP: [name], FNP  DOS: 05/18/2019  Note by: [name], MD  Service setting: Ambulatory outpatient  Specialty: Interventional Pain Management  Location: [facility] (AMB) Pain Management Facility  Visit type: Initial Patient Evaluation  Patient type: New Patient   Primary Reason(s) for Visit: Encounter for initial evaluation of one or more chronic problems (new to examiner) potentially causing chronic pain, and posing a threat to normal musculoskeletal function. (Level of risk: High) CC: No chief complaint on file.  HPI  Alison [name] is a 43 y.o. year old, female patient, who comes today to see [place] for the first time for an initial evaluation of her chronic pain. 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 Today she comes in for evaluation of her No chief complaint on file.   Pharmacodynamics: Desired effects: Analgesia: The patient reports >50% benefit. Reported improvement in function: The patient reports medication allows her to accomplish basic ADLs. Clinically meaningful improvement in function (CMIF): Sustained CMIF goals met Perceived effectiveness: Described as relatively effective, allowing for increase in activities of daily living (ADL) Undesirable effects: Side-effects or Adverse reactions: None reported Historical Monitoring: The patient  reports previous drug use.  Drug: Marijuana. List of all UDS Test(s): No results found for: MDMA, COCAINSCRNUR, [place], [place], CANNABQUANT, [place], [place] List of other Serum/Urine Drug Screening Test(s):  No results found for: AMPHSCRSER, BARBSCRSER, BENZOSCRSER, COCAINSCRSER, COCAINSCRNUR, PCPSCRSER, PCPQUANT, THCSCRSER, THCU, CANNABQUANT, OPIATESCRSER, OXYSCRSER, PROPOXSCRSER, ETH Historical Background Evaluation: [place] PMP: PDMP not reviewed this encounter. Six (6) year initial data search conducted.             PMP NARX Score Report:  Narcotic: *** Sedative: *** Stimulant: *** [place] Department of public safety, offender search: Editor, commissioning Information) Non-contributory Risk Assessment Profile: Aberrant behavior: None observed or detected today Risk factors for fatal opioid overdose: None identified today PMP NARX Overdose Risk Score: *** Fatal overdose hazard ratio (HR): Calculation deferred Non-fatal overdose hazard ratio (HR): Calculation deferred Risk of opioid abuse or dependence: 0.7-3.0% with doses ? 36 MME/day and 6.1-26% with doses ? 120 MME/day. Substance use disorder (SUD) risk level: See below Personal History of Substance Abuse (SUD-Substance use disorder):  Alcohol:    Illegal Drugs:    Rx Drugs:    ORT Risk Level calculation:    ORT Scoring interpretation table:  Score <3 = Low Risk for SUD  Score between 4-7 = Moderate Risk for SUD  Score >8 = High Risk for Opioid Abuse   PHQ-2 Depression Scale:  Total score:    PHQ-2 Scoring interpretation table: (Score and probability of major depressive disorder)  Score 0 = No depression  Score 1 = 15.4% Probability  Score 2 = 21.1% Probability  Score 3 = 38.4% Probability  Score 4 = 45.5% Probability  Score 5 = 56.4% Probability  Score 6 = 78.6% Probability   PHQ-9 Depression Scale:  Total score:    PHQ-9 Scoring interpretation table:  Score 0-4 = No depression  Score 5-9 = Mild depression  Score 10-14 = Moderate depression  Score 15-19 =  Moderately severe depression  Score 20-27 = Severe depression (2.4 times higher risk of SUD and 2.89 times higher risk of overuse)   Pharmacologic Plan: As per protocol, I have not taken over any controlled substance management, pending the results of ordered tests and/or consults.            Initial impression: Pending review of available data and ordered tests.  Meds   Current Outpatient Medications:  .  baclofen (LIORESAL) 20 MG tablet, Take 1 tablet (20 mg total) by mouth 3 (three) times daily., Disp: 30 each, Rfl: 2 .  clonazePAM (KLONOPIN) 0.5 MG tablet, Take 0.5 mg by mouth at bedtime as needed for anxiety., Disp: , Rfl:  .  [place] 125 MG TABS, Take 1 tablet by mouth 1 day or 1 dose., Disp: , Rfl:  .  diphenhydrAMINE (BENADRYL) 50 MG capsule, Take 50 mg by mouth every 6 (six) hours as needed., Disp: , Rfl:  .  HYDROmorphone (DILAUDID) 4 MG tablet, Take 1 tablet by mouth every 6 (six) hours as needed., Disp: , Rfl:  .  lisinopril (ZESTRIL) 20 MG tablet, Take 1 tablet (20 mg total) by mouth 1 day or 1 dose., Disp: 90 tablet, Rfl: 1 .  LORazepam (ATIVAN) 1 MG tablet, Take 1 tablet by mouth at bedtime as needed., Disp: , Rfl:  .  metFORMIN (GLUCOPHAGE) 1000 MG tablet, Take 1 tablet by mouth 2 (two) times a day., Disp: , Rfl:  .  mycophenolate (CELLCEPT) 250 MG capsule, Take 1 capsule by mouth 2 (two) times a day., Disp: , Rfl:  .  oxycodone (ROXICODONE) 30 MG immediate release tablet, Take 1 tablet by mouth every 4 (four) hours as needed., Disp: , Rfl:  .  pregabalin (LYRICA) 200 MG capsule, Take 1 capsule (200 mg total) by mouth 3 (three) times daily for 30 days., Disp: 90 capsule, Rfl: 1 .  promethazine (PHENERGAN) 25 MG tablet, Take 1 tablet by mouth as needed., Disp: , Rfl:  .  tacrolimus (PROGRAF) 1 MG capsule, Take 1 capsule by mouth 1 day or 1 dose., Disp: , Rfl:  .  tiZANidine (ZANAFLEX) 4 MG tablet, Take 1 tablet (4 mg total) by mouth 2 (two) times daily as needed for muscle  spasms., Disp: 30 tablet, Rfl: 1   ROS   Allergies  Alison [name] is allergic to carbamazepine; hydrocodone-acetaminophen; naproxen; and penicillin g.  Laboratory Chemistry Profile   Screening Lab Results  Component Value Date   HCVAB <0.1 03/23/2019    Inflammation (CRP: Acute Phase) (ESR: Chronic Phase) No results found for: CRP, ESRSEDRATE, LATICACIDVEN                       Rheumatology Lab Results  Component Value Date   LABURIC 9.7 (H) 03/23/2019                        Renal Lab Results  Component Value Date   BUN 30 (H) 03/23/2019   CREATININE 1.01 (H) 03/23/2019   GFRAA >60 03/23/2019   GFRNONAA >60 03/23/2019                             Hepatic Lab Results  Component Value Date   AST 30 03/23/2019   ALT 25 03/23/2019   ALBUMIN 3.9 03/23/2019   ALKPHOS 56 03/23/2019   HCVAB <0.1 03/23/2019  Electrolytes Lab Results  Component Value Date   NA 138 03/23/2019   K 4.6 03/23/2019   CL 106 03/23/2019   CALCIUM 8.9 03/23/2019                        Neuropathy No results found for: VITAMINB12, FOLATE, HGBA1C, HIV                      CNS No results found for: COLORCSF, APPEARCSF, RBCCOUNTCSF, WBCCSF, POLYSCSF, LYMPHSCSF, EOSCSF, PROTEINCSF, GLUCCSF, JCVIRUS, CSFOLI, IGGCSF, LABACHR, ACETBL                      Bone No results found for: [place], [id number], [id number], [id number], 25OHVITD1, 25OHVITD2, 25OHVITD3, TESTOFREE, TESTOSTERONE                       Coagulation Lab Results  Component Value Date   PLT 208 03/23/2019                        Cardiovascular Lab Results  Component Value Date   HGB 12.9 03/23/2019   HCT 39.8 03/23/2019                         ID Lab Results  Component Value Date   HCVAB <0.1 03/23/2019    Cancer No results found for: CEA, CA125, LABCA2                      Endocrine No results found for: TSH, FREET4, TESTOFREE, TESTOSTERONE, SHBG, ESTRADIOL, ESTRADIOLPCT, ESTRADIOLFRE, LABPREG,  ACTH                      Note: Lab results reviewed.  [place]  Drug: Ms. Spurling  reports previous drug use. Drug: Marijuana. Alcohol:  reports no history of alcohol use. Tobacco:  reports that she has never smoked. She has never used smokeless tobacco. Medical:  has a past medical history of Abnormal uterine bleeding, Bipolar disorder (manic depression) ([place]), Cancer ([place]), Chronic kidney failure, Chronic renal disease, stage III ([place]), Diabetes mellitus without complication ([place]), 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[place]), FH: trigeminal neuralgia, Hypertension, Kidney mass, Lupus ([place]), Lupus ([place]), Lymphoma ([place]), Lymphoma ([place]), Major depressive disorder, Migraine, Morbid obesity ([place]), Neuropathy, Post herpetic neuralgia, Renal disorder, and S/P liver transplant ([place]). Family: family history includes Cancer in her maternal aunt, maternal grandfather, and maternal uncle; Cancer - Other in her mother; Diabetes in her father; Heart disease in her father and mother; Hypertension in her brother, father, and mother; Lupus in her paternal grandmother; Parkinson's disease in her maternal grandmother.  Past Surgical History:  Procedure Laterality Date  . BONE MARROW BIOPSY  01/13/2015  . BREAST BIOPSY  12/2014  . BREAST BIOPSY  2011  . BREAST SURGERY    . CHOLECYSTECTOMY    . HERNIA REPAIR    . infusaport    . LIVER TRANSPLANT  12/17/1991  . LUMBAR PUNCTURE    . PORT A CATH INJECTION ([place] HX)    . tumor removal  2015   Active Ambulatory Problems    Diagnosis Date Noted  . Fibromyalgia 03/23/2019  . Systemic lupus erythematosus (SLE) in adult ([place]) 03/23/2019  . Essential hypertension 03/23/2019  . Hepatitis 03/23/2019  . Mass of left kidney 03/23/2019  . Diabetes mellitus ([place]) 03/23/2019  . Nausea without  vomiting 03/23/2019  . Neuropathy 03/23/2019  . Cancer ([place]) 03/23/2019  . Hyperlipidemia 03/23/2019  . Osteoarthritis 03/23/2019  . Trigeminal neuralgia 03/23/2019  . Chronic  renal disease, stage III ([place]) 03/23/2019  . Nephrolithiasis 03/23/2019  . Migraine 03/23/2019  . OSA on CPAP 03/23/2019  . Fibrocystic breast 03/23/2019  . Heart murmur 03/23/2019  . Goals of care, counseling/discussion 05/18/2019  . 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[place]) 05/18/2019   Resolved Ambulatory Problems    Diagnosis Date Noted  . No Resolved Ambulatory Problems   Past Medical History:  Diagnosis Date  . Abnormal uterine bleeding   . Bipolar disorder (manic depression) ([place])   . Chronic kidney failure   . Diabetes mellitus without complication ([place])   . 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[place])   . FH: trigeminal neuralgia   . Hypertension   . Kidney mass   . Lupus ([place])   . Lupus ([place])   . Lymphoma ([place])   . Lymphoma ([place])   . Major depressive disorder   . Morbid obesity ([place])   . Post herpetic neuralgia   . Renal disorder   . S/P liver transplant ([place])    Constitutional Exam  General appearance: Well nourished, well developed, and well hydrated. In no apparent acute distress There were no vitals filed for this visit. BMI Assessment: Estimated body mass index is 40.98 kg/m as calculated from the following:   Height as of an earlier encounter on 05/18/19: 5' 6"  (1.676 m).   Weight as of an earlier encounter on 05/18/19: 253 lb 14.4 oz (115.2 kg).  BMI interpretation table: BMI level Category Range association with higher incidence of chronic pain  <18 kg/m2 Underweight   18.5-24.9 kg/m2 Ideal body weight   25-29.9 kg/m2 Overweight Increased incidence by 20%  30-34.9 kg/m2 Obese (Class I) Increased incidence by 68%  35-39.9 kg/m2 Severe obesity (Class II) Increased incidence by 136%  >40 kg/m2 Extreme obesity (Class III) Increased incidence by 254%   Patient's current BMI Ideal Body weight  There is no height or weight on file to calculate BMI. Ideal body  weight: 59.3 kg (130 lb 11.7 oz) Adjusted ideal body weight: 81.6 kg (180 lb)   BMI Readings from Last 4 Encounters:  05/18/19 40.98 kg/m  04/12/19 41.24 kg/m  03/23/19 40.30 kg/m  03/21/19 41.97 kg/m   Wt Readings from Last 4 Encounters:  05/18/19 253 lb 14.4 oz (115.2 kg)  04/12/19 255 lb 8 oz (115.9 kg)  03/23/19 249 lb 11.2 oz (113.3 kg)  03/21/19 260 lb (117.9 kg)  Psych/Mental status: Alert, oriented x 3 (person, place, & time)       Eyes: PERLA Respiratory: No evidence of acute respiratory distress  Cervical Spine Area Exam  Skin & Axial Inspection: No masses, redness, edema, swelling, or associated skin lesions Alignment: Symmetrical Functional ROM: Unrestricted ROM      Stability: No instability detected Muscle Tone/Strength: Functionally intact. No obvious neuro-muscular anomalies detected. Sensory (Neurological): Unimpaired Palpation: No palpable anomalies              Upper Extremity (UE) Exam    Side: Right upper extremity  Side: Left upper extremity  Skin & Extremity Inspection: Skin color, temperature, and hair growth are WNL. No peripheral edema or cyanosis. No masses, redness, swelling, asymmetry, or associated skin lesions. No contractures.  Skin & Extremity Inspection: Skin color, temperature, and hair growth are WNL.  No peripheral edema or cyanosis. No masses, redness, swelling, asymmetry, or associated skin lesions. No contractures.  Functional ROM: Unrestricted ROM          Functional ROM: Unrestricted ROM          Muscle Tone/Strength: Functionally intact. No obvious neuro-muscular anomalies detected.  Muscle Tone/Strength: Functionally intact. No obvious neuro-muscular anomalies detected.  Sensory (Neurological): Unimpaired          Sensory (Neurological): Unimpaired          Palpation: No palpable anomalies              Palpation: No palpable anomalies              Provocative Test(s):  Phalen's test: deferred Tinel's test: deferred Apley's scratch  test (touch opposite shoulder):  Action 1 (Across chest): deferred Action 2 (Overhead): deferred Action 3 (LB reach): deferred   Provocative Test(s):  Phalen's test: deferred Tinel's test: deferred Apley's scratch test (touch opposite shoulder):  Action 1 (Across chest): deferred Action 2 (Overhead): deferred Action 3 (LB reach): deferred    Thoracic Spine Area Exam  Skin & Axial Inspection: No masses, redness, or swelling Alignment: Symmetrical Functional ROM: Unrestricted ROM Stability: No instability detected Muscle Tone/Strength: Functionally intact. No obvious neuro-muscular anomalies detected. Sensory (Neurological): Unimpaired Muscle strength & Tone: No palpable anomalies  Lumbar Spine Area Exam  Skin & Axial Inspection: No masses, redness, or swelling Alignment: Symmetrical Functional ROM: Unrestricted ROM       Stability: No instability detected Muscle Tone/Strength: Functionally intact. No obvious neuro-muscular anomalies detected. Sensory (Neurological): Unimpaired Palpation: No palpable anomalies       Provocative Tests: Hyperextension/rotation test: deferred today       Lumbar quadrant test (Kemp's test): deferred today       Lateral bending test: deferred today       Patrick's Maneuver: deferred today                   FABER* test: deferred today                   S-I anterior distraction/compression test: deferred today         S-I lateral compression test: deferred today         S-I Thigh-thrust test: deferred today         S-I Gaenslen's test: deferred today         *(Flexion, ABduction and External Rotation)  Gait & Posture Assessment  Ambulation: Unassisted Gait: Relatively normal for age and body habitus Posture: WNL   Lower Extremity Exam    Side: Right lower extremity  Side: Left lower extremity  Stability: No instability observed          Stability: No instability observed          Skin & Extremity Inspection: Skin color, temperature, and hair  growth are WNL. No peripheral edema or cyanosis. No masses, redness, swelling, asymmetry, or associated skin lesions. No contractures.  Skin & Extremity Inspection: Skin color, temperature, and hair growth are WNL. No peripheral edema or cyanosis. No masses, redness, swelling, asymmetry, or associated skin lesions. No contractures.  Functional ROM: Unrestricted ROM                  Functional ROM: Unrestricted ROM                  Muscle Tone/Strength: Functionally intact. No obvious neuro-muscular anomalies detected.  Muscle Tone/Strength: Functionally intact. No  obvious neuro-muscular anomalies detected.  Sensory (Neurological): Unimpaired        Sensory (Neurological): Unimpaired        DTR: Patellar: deferred today Achilles: deferred today Plantar: deferred today  DTR: Patellar: deferred today Achilles: deferred today Plantar: deferred today  Palpation: No palpable anomalies  Palpation: No palpable anomalies   Assessment  Primary Diagnosis & Pertinent Problem List: The primary encounter diagnosis was Chronic pain syndrome. 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  Visit Diagnosis (New problems to examiner): 1. Chronic pain syndrome   2. Fibromyalgia   3. Systemic lupus erythematosus (SLE) in adult ([place])   4. Trigeminal neuralgia   5. Chronic renal disease, stage III ([facility])   6. Other migraine without status migrainosus, not intractable   7. OSA on CPAP   8. 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 Opioid dependence with opioid-induced disorder ([place])    Plan of Care (Initial workup  plan)  Note: Ms. Marzec was reminded that as per protocol, today's visit has been an evaluation only. We have not taken over the patient's controlled substance management.  Problem-specific plan: No problem-specific Assessment & Plan notes found for this encounter.   Lab Orders     Compliance Drug Analysis, Ur Imaging Orders  No imaging studies ordered today    Referral Orders     Ambulatory referral to Psychology Procedure Orders    No procedure(s) ordered today   Pharmacotherapy (current): Medications ordered:  Meds ordered this encounter  Medications  . tiZANidine (ZANAFLEX) 4 MG tablet    Sig: Take 1 tablet (4 mg total) by mouth 2 (two) times daily as needed for muscle spasms.    Dispense:  30 tablet    Refill:  1   Medications administered during this visit: Mahreen Schewe had no medications administered during this visit.   Pharmacological management options:  Opioid Analgesics: The patient was informed that there is no guarantee that she would be a candidate for opioid analgesics. The decision will be made following CDC guidelines. This decision will be based on the results of diagnostic studies, as well as Ms. Irion's risk profile.   Membrane stabilizer: To be determined at a later time  Muscle relaxant: To be determined at a later time  NSAID: To be determined at a later time  Other analgesic(s): To be determined at a later time   Interventional management options: Ms. Olinde was informed that there is no guarantee that she would be a candidate for interventional therapies. The decision will be based on the results of diagnostic studies, as well as Ms. Scovell's risk profile.  Procedure(s) under consideration:  ***   Provider-requested follow-up: Return for AFTER [facility] ASSESSEMENT .  Future Appointments  Date Time Provider [place]  05/20/2019 11:20 AM [name], FNP [place] [facility]  05/20/2019 12:30 PM [facility]-TUMOR BOARD CONFERENCE [facility]  None  05/21/2019 12:00 PM Edrick Kins, DPM [facility]  05/25/2019  8:30 AM [facility]  05/28/2019  8:00 AM [facility]-MO LAB [facility] None  05/28/2019  8:30 AM Sindy Guadeloupe, MD [facility] None  05/28/2019  9:00 AM [facility]- MO INFUSION CHAIR 7 [facility] None  06/04/2019 10:45 AM [facility]-MO LAB  [facility] None  06/04/2019 11:15 AM [facility]- MO INFUSION CHAIR 8 [facility] None  06/11/2019 10:15 AM [facility]MO LAB [facility] None  06/11/2019 10:45 AM [facility]- MO INFUSION CHAIR 7 [facility] None  06/18/2019 10:15 AM [facility] LAB [facility] None  06/18/2019 10:45 AM [facility]- MO INFUSION CHAIR 8 [facility] None  10/14/2019  9:30 AM [facility]-MO LAB [facility] None  10/14/2019 10:00 AM Sindy Guadeloupe, MD [facility] None    Primary Care Physician: [name], FNP Location: [facility] Note by: [name], MD Date: 05/18/2019; Time: 1:36 PM  Note: This dictation was prepared with Dragon dictation. Any transcriptional errors that may result from this process are unintentional.

## 2019-05-18 ENCOUNTER — Inpatient Hospital Stay: Payer: Medicaid Other | Attending: Oncology | Admitting: Oncology

## 2019-05-18 ENCOUNTER — Encounter: Payer: Self-pay | Admitting: Student in an Organized Health Care Education/Training Program

## 2019-05-18 ENCOUNTER — Encounter: Payer: Self-pay | Admitting: Family Medicine

## 2019-05-18 ENCOUNTER — Other Ambulatory Visit: Payer: Self-pay

## 2019-05-18 ENCOUNTER — Ambulatory Visit
Payer: Medicaid Other | Attending: Student in an Organized Health Care Education/Training Program | Admitting: Student in an Organized Health Care Education/Training Program

## 2019-05-18 ENCOUNTER — Ambulatory Visit: Payer: Medicaid Other | Admitting: Family Medicine

## 2019-05-18 ENCOUNTER — Encounter: Payer: Self-pay | Admitting: Oncology

## 2019-05-18 VITALS — BP 169/91 | HR 64 | Temp 97.9°F | Resp 16 | Ht 66.0 in | Wt 253.9 lb

## 2019-05-18 DIAGNOSIS — F1129 Opioid dependence with unspecified opioid-induced disorder: Secondary | ICD-10-CM

## 2019-05-18 DIAGNOSIS — C8580 Other specified types of non-Hodgkin lymphoma, unspecified site: Secondary | ICD-10-CM | POA: Diagnosis not present

## 2019-05-18 DIAGNOSIS — Z79899 Other long term (current) drug therapy: Secondary | ICD-10-CM | POA: Diagnosis not present

## 2019-05-18 DIAGNOSIS — C884 Extranodal marginal zone B-cell lymphoma of mucosa-associated lymphoid tissue [MALT-lymphoma]: Secondary | ICD-10-CM | POA: Insufficient documentation

## 2019-05-18 DIAGNOSIS — M549 Dorsalgia, unspecified: Secondary | ICD-10-CM | POA: Diagnosis not present

## 2019-05-18 DIAGNOSIS — G500 Trigeminal neuralgia: Secondary | ICD-10-CM

## 2019-05-18 DIAGNOSIS — E1122 Type 2 diabetes mellitus with diabetic chronic kidney disease: Secondary | ICD-10-CM | POA: Insufficient documentation

## 2019-05-18 DIAGNOSIS — G4733 Obstructive sleep apnea (adult) (pediatric): Secondary | ICD-10-CM

## 2019-05-18 DIAGNOSIS — M797 Fibromyalgia: Secondary | ICD-10-CM | POA: Diagnosis not present

## 2019-05-18 DIAGNOSIS — Z7984 Long term (current) use of oral hypoglycemic drugs: Secondary | ICD-10-CM | POA: Diagnosis not present

## 2019-05-18 DIAGNOSIS — Z7189 Other specified counseling: Secondary | ICD-10-CM | POA: Insufficient documentation

## 2019-05-18 DIAGNOSIS — N183 Chronic kidney disease, stage 3 unspecified: Secondary | ICD-10-CM

## 2019-05-18 DIAGNOSIS — M329 Systemic lupus erythematosus, unspecified: Secondary | ICD-10-CM | POA: Diagnosis not present

## 2019-05-18 DIAGNOSIS — G894 Chronic pain syndrome: Secondary | ICD-10-CM | POA: Diagnosis not present

## 2019-05-18 DIAGNOSIS — K754 Autoimmune hepatitis: Secondary | ICD-10-CM | POA: Insufficient documentation

## 2019-05-18 DIAGNOSIS — G43809 Other migraine, not intractable, without status migrainosus: Secondary | ICD-10-CM

## 2019-05-18 DIAGNOSIS — I129 Hypertensive chronic kidney disease with stage 1 through stage 4 chronic kidney disease, or unspecified chronic kidney disease: Secondary | ICD-10-CM | POA: Diagnosis not present

## 2019-05-18 DIAGNOSIS — Z9989 Dependence on other enabling machines and devices: Secondary | ICD-10-CM

## 2019-05-18 DIAGNOSIS — Z944 Liver transplant status: Secondary | ICD-10-CM | POA: Diagnosis not present

## 2019-05-18 DIAGNOSIS — Z5112 Encounter for antineoplastic immunotherapy: Secondary | ICD-10-CM | POA: Insufficient documentation

## 2019-05-18 MED ORDER — TIZANIDINE HCL 4 MG PO TABS: 4.0000 mg | ORAL_TABLET | Freq: Two times a day (BID) | ORAL | 1 refills | Status: DC | PRN

## 2019-05-18 NOTE — Progress Notes (Signed)
START OFF PATHWAY REGIMEN - [name] and CLL   [id number]:Rituximab (IV/Subcut) D1 Weekly:   A cycle is every 7 days:     Rituximab-xxxx      Rituximab and hyaluronidase human   **Always confirm dose/schedule in your pharmacy ordering system**  Patient Characteristics: Alison [name], Localized, Other Disease Type: Alison [name] Disease Type: Not Applicable Disease Type: Not Applicable Localized or Systemic Disease<= Localized [place] Stage: II Localized Disease Type: Other Intent of Therapy: Curative Intent, Discussed with Patient

## 2019-05-18 NOTE — Progress Notes (Signed)
Pt  Here to talk about results of bx- she had night sweats, only slept 1 hour. Nauseated this am and took phenergan and pain pill this am.

## 2019-05-18 NOTE — Progress Notes (Signed)
Patient's Name: Alison [name]  MRN: [id number]  Referring Provider: [name], FNP  DOB: 09/12/1976  PCP: [name], FNP  DOS: 05/18/2019  Note by: [name], MD  Service setting: Ambulatory outpatient  Specialty: Interventional Pain Management  Location: [facility] Pain Management Virtual Visit  Visit type: Initial Patient Evaluation  Patient type: New Patient   Pain Management Virtual Encounter Note - Virtual Visit via Telephone Telehealth (real-time audio visits between healthcare provider and patient).   Patient's Phone No.:  [phone] (home); [phone] (mobile); (Preferred) [phone] [email or web address] .[place], [place] - [address] [place] [address] Phone: [phone] Fax: [phone]    Pre-screening note:  Our staff contacted Alison [name] and offered her an "in person", "face-to-face" appointment versus a telephone encounter. She indicated preferring the telephone encounter, at this time.  Primary Reason(s) for Visit: Tele-Encounter for initial evaluation of one or more chronic problems (new to examiner) potentially causing chronic pain, and posing a threat to normal musculoskeletal function. (Level of risk: High) CC: No chief complaint on file.  I contacted Alison [name] on 05/18/2019 via telephone.      I clearly identified myself as [name], MD. I verified that I was speaking with the correct person using two identifiers (Name: Alison [name], and date of birth: 09-08-1976).  Advanced Informed Consent I sought verbal advanced consent from Alison [name] for virtual visit interactions. I informed Alison [name] of possible security and privacy concerns, risks, and limitations associated with providing "not-in-person" medical evaluation and management services. I also informed Alison [name] of the availability of "in-person" appointments. Finally, I informed her that there would be a charge for the virtual visit and  that she could be  personally, fully or partially, financially responsible for it. Alison [name] expressed understanding and agreed to proceed.   HPI  Alison [name] is a 43 y.o. year old, female patient, contacted today for an initial evaluation of her chronic pain. 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    Pain Assessment: Onset and Duration: Gradual and Present longer than 3 months Cause of pain: DDD Severity: Getting worse, NAS-11 at its worse: 9/10, NAS-11 at its best: 2/10, NAS-11 now: 4/10 and NAS-11 on the average: 4/10 Timing: Not influenced by the time of the day and After activity or exercise Aggravating Factors: Bending, Motion, Squatting, Stooping , Walking, Walking uphill and Walking downhill Alleviating Factors: Lying down, Medications and Resting Associated Problems: Dizziness, Fatigue, Nausea, Weakness, Pain that wakes patient up and Pain that does not allow patient to sleep Quality of Pain: Aching, Constant, Intermittent, Deep and Uncomfortable Previous Examinations or Tests: X-rays Previous Treatments: Narcotic medications and Physical Therapy, injections   The patient comes into the clinics today for the first time for a chronic pain management evaluation.   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 She also has a history of post transplant lymphoproliferative disorder/DLBCL that was  diagnosed in 2016. She is s/p 6 cycles of R-CHOP chemotherapy back then and was in complete remission 1. Her  other past medical history significant for migraines, stage III chronic kidney disease, chemo-induced peripheral neuropathy, hypertension among other medical problems including fibromyalgia, degenerative disc disease, bilateral trigeminal neuralgia. With regards to her diffuse large B-cell lymphoma she was getting surveillance scans in [place]. MRI thoracic spine without contrast in August 2019 showed moderate multilevel spondylosis but no evidence of lymphoma. Multinodular thyroid in June 2019. MRI cervical spine May 2019 again showed spondylosis but no other acute pathology. MRI brain showed incidental partial opacification of the right and right sinus. No evidence of malignancy.  Patient states that she is primarily interested in continuing her chronic opioid pain regimen that she was on in [place].  Patient was on Dilaudid 4 mg every 6 as needed as well as oxycodone 30 mg every 4 as needed.  She states that she has been on this since 2014.  She states that she has been utilizing less medication since she moved here 2 months ago as she has been scared of running out.  She states that she has seen many doctors in the area but they referred her to pain management.  Also utilizes MJ per intake form.  Historic Controlled Substance Pharmacotherapy Review  03/21/2019 4 03/21/2019 Pregabalin 200 Mg Capsule 100.00 [address] [id number] [employer] ([phone]) 0/0 3.94 LME Private Pay [place] 02/19/2019 3 02/17/2019 Hydromorphone 4 Mg Tablet 90.00 22 [place] [id number] [place] [phone]) 0/0 65.45 MME [place] Pay [place] 02/17/2019 3 02/17/2019 Oxycodone Hcl 30 Mg Tablet 120.00 20 [place] [id number] [place] [phone]) 0/0 270.00 MME [place] Ins [place]  PMP obtained from [place].  Patient is oxycodone 30 mg every 4 hours equals 270 MME's, hydromorphone 4 mg every 6 hours equal 65 MME.  Total MME is 335   Pharmacodynamics: Desired  effects: Analgesia: The patient reports <50% benefit. Reported improvement in function: The patient reports medication allows her to accomplish basic ADLs. Clinically meaningful improvement in function (CMIF): Sustained CMIF goals met Perceived effectiveness: Described as relatively effective, allowing for increase in activities of daily living (ADL) Undesirable effects: Side-effects or Adverse reactions: None reported Historical Monitoring: The patient  reports previous drug use. Drug: Marijuana. List of all UDS Test(s): No results found for: MDMA, COCAINSCRNUR, [place], [place], CANNABQUANT, [place], [place] List of other Serum/Urine Drug Screening Test(s):  No results found for: AMPHSCRSER, BARBSCRSER, BENZOSCRSER, COCAINSCRSER, COCAINSCRNUR, PCPSCRSER, PCPQUANT, THCSCRSER, THCU, CANNABQUANT, OPIATESCRSER, OXYSCRSER, PROPOXSCRSER, ETH Historical Background Evaluation: [place] PMP: PDMP not reviewed this encounter. Six (6) year initial data search conducted.             [place] Department of public safety, offender search: Editor, commissioning Information) Non-contributory Risk Assessment Profile: Aberrant behavior: claims that "nothing else works", diminished ability to recognize a problem with one's behavior or use of the medication, dysfunctional emotional process, request for specific drugs or requesting "Brand Name" drugs, resistance to changing therapy and significant opioid tolerance Risk factors for fatal opioid overdose: age 61-46 years old, Benzodiazepine use, concomitant use of Benzodiazepines, kidney disease and liver disease Fatal overdose hazard ratio (HR): 2.04 for doses equal to, or higher than 100 MME/day Non-fatal overdose hazard ratio (HR): 2.88 for doses equal to, or higher than 200 MME/day Risk of opioid abuse or dependence: 0.7-3.0% with doses ? 36 MME/day and 6.1-26% with doses ? 120 MME/day. Substance use disorder (SUD) risk level: Pending results of Medical Psychology Evaluation for  SUD   Pharmacologic Plan: As per protocol, I have not taken over any controlled substance management, pending the results of ordered tests and/or  consults.            Initial impression: Pending review of available data and ordered tests.  Meds   Current Outpatient Medications:  .  baclofen (LIORESAL) 20 MG tablet, Take 1 tablet (20 mg total) by mouth 3 (three) times daily., Disp: 30 each, Rfl: 2 .  clonazePAM (KLONOPIN) 0.5 MG tablet, Take 0.5 mg by mouth at bedtime as needed for anxiety., Disp: , Rfl:  .  Cranberry 125 MG TABS, Take 1 tablet by mouth 1 day or 1 dose., Disp: , Rfl:  .  diphenhydrAMINE (BENADRYL) 50 MG capsule, Take 50 mg by mouth every 6 (six) hours as needed., Disp: , Rfl:  .  HYDROmorphone (DILAUDID) 4 MG tablet, Take 1 tablet by mouth every 6 (six) hours as needed., Disp: , Rfl:  .  lisinopril (ZESTRIL) 20 MG tablet, Take 1 tablet (20 mg total) by mouth 1 day or 1 dose., Disp: 90 tablet, Rfl: 1 .  LORazepam (ATIVAN) 1 MG tablet, Take 1 tablet by mouth at bedtime as needed., Disp: , Rfl:  .  metFORMIN (GLUCOPHAGE) 1000 MG tablet, Take 1 tablet by mouth 2 (two) times a day., Disp: , Rfl:  .  mycophenolate (CELLCEPT) 250 MG capsule, Take 1 capsule by mouth 2 (two) times a day., Disp: , Rfl:  .  oxycodone (ROXICODONE) 30 MG immediate release tablet, Take 1 tablet by mouth every 4 (four) hours as needed., Disp: , Rfl:  .  pregabalin (LYRICA) 200 MG capsule, Take 1 capsule (200 mg total) by mouth 3 (three) times daily for 30 days., Disp: 90 capsule, Rfl: 1 .  promethazine (PHENERGAN) 25 MG tablet, Take 1 tablet by mouth as needed., Disp: , Rfl:  .  tacrolimus (PROGRAF) 1 MG capsule, Take 1 capsule by mouth 1 day or 1 dose., Disp: , Rfl:  .  tiZANidine (ZANAFLEX) 4 MG tablet, Take 1 tablet (4 mg total) by mouth 2 (two) times daily as needed for muscle spasms., Disp: 30 tablet, Rfl: 1  ROS  Cardiovascular: Daily Aspirin intake Pulmonary or Respiratory: No reported pulmonary  signs or symptoms such as wheezing and difficulty taking a deep full breath (Asthma), difficulty blowing air out (Emphysema), coughing up mucus (Bronchitis), persistent dry cough, or temporary stoppage of breathing during sleep Neurological: Abnormal skin sensations (Peripheral Neuropathy) Review of Past Neurological Studies: No results found for this or any previous visit. Psychological-Psychiatric: Psychiatric disorder, Anxiousness, Depressed, Prone to panicking, Suicidal ideations, Attempted suicide and History of abuse Gastrointestinal: Reflux or heatburn Genitourinary: Kidney disease, kidney failure, Liver transplant Hematological: No reported hematological signs or symptoms such as prolonged bleeding, low or poor functioning platelets, bruising or bleeding easily, hereditary bleeding problems, low energy levels due to low hemoglobin or being anemic Endocrine: High blood sugar controlled without the use of insulin (NIDDM) Rheumatologic: Butterfly-like facial rash (Lupus), Joint aches and or swelling due to excess weight (Osteoarthritis), Rheumatoid arthritis, Generalized muscle aches (Fibromyalgia) and Constant unexplained fatigue (Chronic Fatigue Syndrome) Musculoskeletal: Negative for myasthenia gravis, muscular dystrophy, multiple sclerosis or malignant hyperthermia Work History: Disabled   Allergies  Alison [name] is allergic to carbamazepine; hydrocodone-acetaminophen; naproxen; and penicillin g.  Laboratory Chemistry Profile   Screening Lab Results  Component Value Date   HCVAB <0.1 03/23/2019    Inflammation (CRP: Acute Phase) (ESR: Chronic Phase) No results found for: CRP, ESRSEDRATE, [place]                       Rheumatology  Lab Results  Component Value Date   LABURIC 9.7 (H) 03/23/2019                        Renal Lab Results  Component Value Date   BUN 30 (H) 03/23/2019   CREATININE 1.01 (H) 03/23/2019   GFRAA >60 03/23/2019   GFRNONAA >60 03/23/2019                              Hepatic Lab Results  Component Value Date   AST 30 03/23/2019   ALT 25 03/23/2019   ALBUMIN 3.9 03/23/2019   ALKPHOS 56 03/23/2019   HCVAB <0.1 03/23/2019                        Electrolytes Lab Results  Component Value Date   NA 138 03/23/2019   K 4.6 03/23/2019   CL 106 03/23/2019   CALCIUM 8.9 03/23/2019                         Coagulation Lab Results  Component Value Date   PLT 208 03/23/2019                        Cardiovascular Lab Results  Component Value Date   HGB 12.9 03/23/2019   HCT 39.8 03/23/2019                         ID Lab Results  Component Value Date   HCVAB <0.1 03/23/2019    Note: Lab results reviewed.  Imaging Review   Complexity Note: Imaging results reviewed. Results shared with Alison [name], using Layman's terms.                         [place]  Drug: Alison [name]  reports previous drug use. Drug: Marijuana. Alcohol:  reports no history of alcohol use. Tobacco:  reports that she has never smoked. She has never used smokeless tobacco. Medical:  has a past medical history of Abnormal uterine bleeding, Bipolar disorder (manic depression) ([place]), Cancer ([place]), Chronic kidney failure, Chronic renal disease, stage III ([place]), Diabetes mellitus without complication ([place]), 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[place]), FH: trigeminal neuralgia, Hypertension, Kidney mass, Lupus ([place]), Lupus ([place]), Lymphoma ([place]), Lymphoma ([place]), Major depressive disorder, Migraine, Morbid obesity ([place]), Neuropathy, Post herpetic neuralgia, Renal disorder, and S/P liver transplant ([place]). Family: family history includes Cancer in her maternal aunt, maternal grandfather, and maternal uncle; Cancer - Other in her mother; Diabetes in her father; Heart disease in her father and mother; Hypertension in her brother, father, and mother; Lupus in her paternal grandmother; Parkinson's disease in her maternal grandmother.  Past Surgical History:  Procedure  Laterality Date  . BONE MARROW BIOPSY  01/13/2015  . BREAST BIOPSY  12/2014  . BREAST BIOPSY  2011  . BREAST SURGERY    . CHOLECYSTECTOMY    . HERNIA REPAIR    . infusaport    . LIVER TRANSPLANT  12/17/1991  . LUMBAR PUNCTURE    . PORT A CATH INJECTION ([place] HX)    . tumor removal  2015   Active Ambulatory Problems    Diagnosis Date Noted  . Fibromyalgia 03/23/2019  . Systemic lupus erythematosus (SLE) in adult ([place]) 03/23/2019  . Essential hypertension 03/23/2019  .  Hepatitis 03/23/2019  . Mass of left kidney 03/23/2019  . Diabetes mellitus ([place]) 03/23/2019  . Nausea without vomiting 03/23/2019  . Neuropathy 03/23/2019  . Cancer ([place]) 03/23/2019  . Hyperlipidemia 03/23/2019  . Osteoarthritis 03/23/2019  . Trigeminal neuralgia 03/23/2019  . Chronic renal disease, stage III ([place]) 03/23/2019  . Nephrolithiasis 03/23/2019  . Migraine 03/23/2019  . OSA on CPAP 03/23/2019  . Fibrocystic breast 03/23/2019  . Heart murmur 03/23/2019  . Goals of care, counseling/discussion 05/18/2019  . 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[place]) 05/18/2019   Resolved Ambulatory Problems    Diagnosis Date Noted  . No Resolved Ambulatory Problems   Past Medical History:  Diagnosis Date  . Abnormal uterine bleeding   . Bipolar disorder (manic depression) ([place])   . Chronic kidney failure   . Diabetes mellitus without complication ([place])   . 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[place])   . FH: trigeminal neuralgia   . Hypertension   . Kidney mass   . Lupus ([place])   . Lupus ([place])   . Lymphoma ([place])   . Lymphoma ([place])   . Major depressive disorder   . Morbid obesity ([place])   . Post herpetic neuralgia   . Renal disorder   . S/P liver transplant [facility])    Assessment  Primary Diagnosis & Pertinent Problem List: The primary encounter diagnosis was Chronic pain syndrome. 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  Visit Diagnosis (New problems to examiner): 1. Chronic pain syndrome   2. Fibromyalgia   3. Systemic lupus erythematosus (SLE) in adult ([place])   4. Trigeminal neuralgia   5. Chronic renal disease, stage III (HCC)   6. Other migraine without status migrainosus, not intractable   7. OSA on CPAP   8. 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 Opioid dependence with opioid-induced disorder ([place])   I had extensive discussion with the patient about the goals of pain management.  We discussed nonpharmacological approaches to pain management that include physical therapy, dieting, sleep hygiene, psychotherapy, interventional therapy.  We discussed the importance of understanding the type of pain including neuropathic, nociceptive, centralized.  I also stressed the importance of multimodal analgesia with an emphasis on nondrug modalities including self management, behavioral health support and physical therapy.  We discussed the importance of physical therapy and how a individualized physical therapy and occupational therapy program tailored to patient limitations can be helpful at improving physical function. We also discussed the importance of insomnia and disrupted sleep and how improved sleep hygiene and cognitive therapy could be helpful.  Psychotherapy including CBT, mind-body therapies, pain coping strategies can be helpful for patients whose pain impacts mood, sleep, quality of life, relationships with others.  We discussed avoiding benzodiazepines.  I also had an extensive discussion with  the patient about interventional therapies which is my expertise and how these could be incorporated into an effective multimodal pain management plan.   43 year old female with a complex medical history as stated above who presents primarily requesting continuation of her chronic opioid regimen that she was on in [place].  She states that this was managed  by her oncologist.  I had an extensive and detailed discussion with the patient regarding my reservations and concerns for her being on such a high dose.  Patient's current MME is approximately 335 which includes Dilaudid 4 mg every 6 hours as needed, oxycodone 30 mg every 4 hours PRN.  I informed her that this is an exceptionally high dose.  Generally we do not escalate more than 60 MME's for nonmalignant related chronic pain and no more than 120 MME for malignancy associated pain.  This is only usually for active oncologic processes.  I discussed the phenomena of opioid-induced hyperalgesia, psychological and physical tolerance and opioid dependence.  I informed the patient that it is in her best interest to try and wean her opioid medications as in my opinion she is on an exceptionally high dose that likely is not managing her pain well.  There were other questionable statements made by the patient which makes me want to further evaluate her pain coping skills and her psychiatric history. She does have a history of bipolar disorder which increases her risk of opioid misuse/abuse.    When I informed her that she would not be receiving any opioid analgesics until customary urine toxicology screen and pain psych assessment, she stated "what am I to do with my trigeminal neuralgia if he gets out of hand.  I almost killed myself before when he got out of hand".  When I asked her to clarify what she meant, she stated that her trigeminal neuralgia pain can be very debilitating which I agreed with.  I reinforced to her that she is being managed by neurology  for trigeminal neuralgia and that they can consider SPG blocks, and possible referral for gamma knife as that is a treatment choice for trigeminal neuralgia.  Furthermore the patient's history also states marijuana use as well as Klonopin use.  She also has a history of obstructive sleep apnea.  This further increases her risk of respiratory side effects from chronic opioid therapy especially at such a high dose.  Patient also has a history of chronic kidney disease and chronic liver disease with a history of liver transplant.  We know that patients with established kidney and liver disease also have a higher risk of opioid adverse effects and abuse potential.  Patient was adamant on work-up process to be considered for chronic opioid therapy.  I informed her that we can start with a urine toxicology screen and a psychological assessment to evaluate risk for substance misuse abuse.  Pending urine tox screen and psych assessment, if patient is deemed a candidate for chronic opioid therapy, I was very clear with her and informed her that we would not exceed a total daily dose of 80 MME. patient endorsed understanding.  I also informed her that for her acute withdrawal symptoms that she experiences, I can prescribe tizanidine to assist with any autonomic activation that she may experience.  Patient did consent to safety and was in agreement with plan.  I was very clear with her regarding my rationale and explained to her that she is welcome to get another opinion or seek another pain management provider for his or her recommendations.  I also informed the patient that we need records from her oncologist in [place] detailing her medication history as well as her urine toxicology results there.  This is to ensure medication compliance and safety.  After patient has completed urine tox screen, pain psych assessment and after we have received records from  [place] from her oncologist specifically in regards  to opioid medication compliance and monitoring, can further discuss candidacy for chronic opioid therapy.   Plan of Care (Initial workup plan)   Lab Orders     Compliance Drug Analysis, Ur  Referral Orders     Ambulatory referral to Psychology Pharmacotherapy (current): Medications ordered:  Meds ordered this encounter  Medications  . tiZANidine (ZANAFLEX) 4 MG tablet    Sig: Take 1 tablet (4 mg total) by mouth 2 (two) times daily as needed for muscle spasms.    Dispense:  30 tablet    Refill:  1   Medications administered during this visit: Adam Demary had no medications administered during this visit.    Provider-requested follow-up: Return for AFTER [facility] ASSESSEMENT .  Future Appointments  Date Time Provider [place]  05/20/2019 11:20 AM [name], FNP [facility]  05/20/2019 12:30 PM [facility]-TUMOR BOARD CONFERENCE [facility] None  05/21/2019 12:00 PM Edrick Kins, DPM [facility]  05/25/2019  8:30 AM [facility]  05/28/2019  8:00 AM [facility]-MO LAB [facility] None  05/28/2019  8:30 AM Sindy Guadeloupe, MD [facility] None  05/28/2019  9:00 AM [facility]- [place] INFUSION CHAIR 7 [facility] None  06/04/2019 10:45 AM [facility]-MO LAB [facility] None  06/04/2019 11:15 AM [facility]- [facility] INFUSION CHAIR 8 [facility] None  06/11/2019 10:15 AM [facility]MO LAB [facility] None  06/11/2019 10:45 AM [facility]- [facility] INFUSION CHAIR 7 [facility] None  06/18/2019 10:15 AM [facility] LAB [facility] None  06/18/2019 10:45 AM [facility]- [place] INFUSION CHAIR 8 [facility] None  10/14/2019  9:30 AM [facility]-MO LAB [facility] None  10/14/2019 10:00 AM Sindy Guadeloupe, MD [facility] None    Total duration of non-face-to-face encounter: 45 minutes.  Primary Care Physician: [name], FNP Location: [facility] Note by: [name], MD Date: 05/18/2019; Time: 1:32 PM  Note: This dictation was prepared with Dragon dictation. Any transcriptional errors that may result from this  process are unintentional.

## 2019-05-18 NOTE — Progress Notes (Signed)
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 She is currently on CellCept and tacrolimus for the same. She also has a history of post transplant lymphoproliferative disorder/DLBCL that was diagnosed in 2016. She is s/p 6 cycles of R-CHOP chemotherapy back then and was in complete remission 1. Her other past medical history significant for migraines, stage III chronic kidney disease, chemo-induced peripheral neuropathy, hypertension among other medical problems. With regards to her diffuse large B-cell lymphoma she was getting surveillance scans and this was all done in [place]. She has now moved to [facility] to be with her significant other.  Patient was noted to have a prior kidney mass before which was biopsied and was not consistent with malignancy.  She underwent CT neck with contrast before she left [place] on 02/22/2019. She was noted to have soft tissue masses in both the parotid glands which were new as compared to prior exams and possibly represent lymph nodes. The largest one was seen in the left parotid gland measuring 1.3 x 1.1 cm. Before she could get a work-up for this patient moved to [place] and has not had any further work-up yet  Her other prior imaging is as follows: MRI thoracic spine without contrast in August 2019 showed moderate multilevel spondylosis  but no evidence of lymphoma. Multinodular thyroid in June 2019. MRI cervical spine May 2019 again showed spondylosis but no other acute pathology. MRI brain showed incidental partial opacification of the right and right sinus. No evidence of malignancy. I do not have any other PET CT scan or treatment records from the past.    Interval history-patient reports some pain over her left side of the neck.  Appetite and weight are stable.  She also has complains of chronic dry mouth.  ECOG PS- 1 Pain scale- 5 Opioid associated constipation- no  Review of systems- Review of Systems  Constitutional: Negative for chills, fever, malaise/fatigue and weight loss.  HENT: Negative for congestion, ear discharge and nosebleeds.   Eyes: Negative for blurred vision.  Respiratory: Negative for cough, hemoptysis, sputum production, shortness of breath and wheezing.   Cardiovascular: Negative for chest pain, palpitations, orthopnea and claudication.  Gastrointestinal: Negative for abdominal pain, blood in stool, constipation, diarrhea, heartburn, melena, nausea and vomiting.  Genitourinary: Negative for dysuria, flank pain, frequency, hematuria and urgency.  Musculoskeletal: Positive for neck pain. Negative for back pain, joint pain and myalgias.  Skin: Negative for rash.  Neurological: Negative for dizziness, tingling, focal weakness, seizures, weakness and headaches.  Endo/Heme/Allergies: Does not bruise/bleed easily.  Psychiatric/Behavioral: Negative for depression and suicidal ideas. The patient does not have insomnia.        Allergies  Allergen Reactions   Carbamazepine Other (See Comments)    Medication interaction-prograf   Hydrocodone-Acetaminophen Itching   Naproxen Itching   Penicillin G Itching and Rash     Past Medical History:  Diagnosis Date   Abnormal uterine bleeding    Bipolar disorder (manic depression) (  [place])    Cancer ([place])    Chronic kidney failure    Chronic  renal disease, stage III (HCC)    Diabetes mellitus without complication (HCC)    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HCC)    FH: trigeminal neuralgia    Hypertension    Kidney mass    Lupus (HCC)    Lupus (HCC)    Lymphoma (HCC)    Lymphoma (HCC)    Major depressive disorder    Migraine    Morbid obesity ([place])    Neuropathy    Post herpetic neuralgia    Renal disorder    S/P liver transplant [facility])      Past Surgical History:  Procedure Laterality Date   BONE MARROW BIOPSY  01/13/2015   BREAST BIOPSY  12/2014   BREAST BIOPSY  2011   BREAST SURGERY     CHOLECYSTECTOMY     HERNIA REPAIR     infusaport     LIVER TRANSPLANT  12/17/1991   LUMBAR PUNCTURE     PORT A CATH INJECTION ([place] [facility])     tumor removal  2015    Social History   Socioeconomic History   Marital status: Single    Spouse name: Not on file   Number of children: Not on file   Years of education: 9   Highest education level: Not on file  Occupational History   Occupation: disabled  Social Designer, fashion/clothing strain: Hard   Food insecurity    Worry: Often true    Inability: Often true   Transportation needs    Medical: No    Non-medical: No  Tobacco Use   Smoking status: Never Smoker   Smokeless tobacco: Never Used  Substance and Sexual Activity   Alcohol use: Never    Frequency: Never   Drug use: Not Currently    Types: Marijuana   Sexual activity: Not Currently  Lifestyle   Physical activity    Days per week: 7 days    Minutes per session: 20 min   Stress: To some extent  Relationships   Social connections    Talks on phone: Once a week    Gets together: Three times a week    Attends religious service: More than 4 times per year    Active member of club or organization: No    Attends meetings of clubs or organizations: Never    Relationship status: Not on file   Intimate partner violence    Fear of current or ex partner: No     Emotionally abused: No    Physically abused: No    Forced sexual activity: No  Other Topics Concern   Not on file  Social History Narrative   Not on file    Family History  Problem Relation Age of Onset   Heart disease Mother    Hypertension Mother    Cancer - Other Mother    Heart disease Father    Hypertension Father    Diabetes Father    Parkinson's disease Maternal Grandmother    Cancer Maternal Aunt    Cancer Maternal Uncle    Cancer Maternal Grandfather    Lupus Paternal Grandmother    Hypertension Brother      Current Outpatient Medications:    baclofen (LIORESAL) 20 MG tablet, Take 1 tablet (20 mg total) by mouth 3 (three) times daily., Disp: 30 each, Rfl: 2   clonazePAM (KLONOPIN) 0.5 MG tablet, Take 0.5 mg by  mouth at bedtime as needed for anxiety., Disp: , Rfl:    Cranberry 125 MG TABS, Take 1 tablet by mouth 1 day or 1 dose., Disp: , Rfl:    diphenhydrAMINE (BENADRYL) 50 MG capsule, Take 50 mg by mouth every 6 (six) hours as needed., Disp: , Rfl:    HYDROmorphone (DILAUDID) 4 MG tablet, Take 1 tablet by mouth every 6 (six) hours as needed., Disp: , Rfl:    lisinopril (ZESTRIL) 20 MG tablet, Take 1 tablet (20 mg total) by mouth 1 day or 1 dose., Disp: 90 tablet, Rfl: 1   LORazepam (ATIVAN) 1 MG tablet, Take 1 tablet by mouth at bedtime as needed., Disp: , Rfl:    metFORMIN (GLUCOPHAGE) 1000 MG tablet, Take 1 tablet by mouth 2 (two) times a day., Disp: , Rfl:    mycophenolate (CELLCEPT) 250 MG capsule, Take 1 capsule by mouth 2 (two) times a day., Disp: , Rfl:    oxycodone (ROXICODONE) 30 MG immediate release tablet, Take 1 tablet by mouth every 4 (four) hours as needed., Disp: , Rfl:    pregabalin (LYRICA) 200 MG capsule, Take 1 capsule (200 mg total) by mouth 3 (three) times daily for 30 days., Disp: 90 capsule, Rfl: 1   promethazine (PHENERGAN) 25 MG tablet, Take 1 tablet by mouth as needed., Disp: , Rfl:    tacrolimus (PROGRAF) 1 MG  capsule, Take 1 capsule by mouth 1 day or 1 dose., Disp: , Rfl:   Physical exam:  Vitals:   05/18/19 0908  BP: (!) 169/91  Pulse: 64  Resp: 16  Temp: 97.9 F (36.6 C)  TempSrc: Tympanic  Weight: 253 lb 14.4 oz (115.2 kg)  Height: _0  (1.676 m)   Physical Exam HENT:     Head: Normocephalic and atraumatic.  Eyes:     Pupils: Pupils are equal, round, and reactive to light.  Neck:     Musculoskeletal: Normal range of motion.  Cardiovascular:     Rate and Rhythm: Normal rate and regular rhythm.     Heart sounds: Normal heart sounds.  Pulmonary:     Effort: Pulmonary effort is normal.     Breath sounds: Normal breath sounds.  Abdominal:     General: Bowel sounds are normal.     Palpations: Abdomen is soft.  Lymphadenopathy:     Comments: Palpable pea-sized swelling in the region of left parotid.  No palpable swelling/masses on the right side.  No palpable cervical adenopathy.  Skin:    General: Skin is warm and dry.  Neurological:     Mental Status: She is alert and oriented to person, place, and time.      CMP Latest Ref Rng & Units 03/23/2019  Glucose 70 - 99 mg/dL 145(H)  BUN 6 - 20 mg/dL 30(H)  Creatinine 0.44 - 1.00 mg/dL 1.01(H)  Sodium 135 - 145 mmol/L 138  Potassium 3.5 - 5.1 mmol/L 4.6  Chloride 98 - 111 mmol/L 106  CO2 22 - 32 mmol/L 23  Calcium 8.9 - 10.3 mg/dL 8.9  Total Protein 6.5 - 8.1 g/dL 7.6  Total Bilirubin 0.3 - 1.2 mg/dL 0.4  Alkaline Phos 38 - 126 U/L 56  AST 15 - 41 U/L 30  ALT 0 - 44 U/L 25   CBC Latest Ref Rng & Units 03/23/2019  WBC 4.0 - 10.5 K/uL 6.2  Hemoglobin 12.0 - 15.0 g/dL 12.9  Hematocrit 36.0 - 46.0 % 39.8  Platelets 150 - 400 K/uL 208  No images are attached to the encounter.  US Soft Tissue Head & Neck (non-thyroid)  Result Date: 05/04/2019 CLINICAL DATA:  History of lymphoma now with hypermetabolic bilateral parotid nodules. EXAM: ULTRASOUND OF HEAD/NECK SOFT TISSUES TECHNIQUE: Ultrasound examination of the head and  neck soft tissues was performed in the area of clinical concern. COMPARISON:  PET-CT - 04/07/2019 FINDINGS: Sonographic evaluation of the left parotid gland demonstrates a well-defined approximately 1.4 x 1.3 x 0.8 cm hypoechoic suspected lymph node likely within the peripheral aspect the left parotid gland. Sonographic evaluation of the right parotid gland demonstrates a similar appearing approximately 1.2 x 1.1 x 0.7 cm suspected lymph node also likely within the peripheral aspect of the right parotid gland. IMPRESSION: Sonographic evaluation demonstrates prominent though non pathologically enlarged bilateral parotid gland lymph nodes. Patient subsequent underwent ultrasound-guided biopsy of both of the parotid gland nodules/lymph nodes. Electronically Signed   By: Sandi Mariscal M.D.   On: 05/04/2019 11:58   [place] Core Biopsy (salivary Gland/parotid Gland)  Result Date: 05/04/2019 INDICATION: History of lymphoma, now with indeterminate bilateral hypermetabolic parotid gland nodules/lymph nodes. Please perform ultrasound-guided biopsy for tissue diagnostic purposes. EXAM: 1. ULTRASOUND-GUIDED LEFT PAROTID GLAND NODULE/LYMPH NODE BIOPSY 2. ULTRASOUND-GUIDED RIGHT PAROTID GLAND NODULE/LYMPH NODE BIOPSY COMPARISON:  PET-CT-04/07/2019 MEDICATIONS: None ANESTHESIA/SEDATION: Moderate (conscious) sedation was employed during this procedure. A total of Versed 3 mg and Fentanyl 100 mcg was administered intravenously. Moderate Sedation Time: 25 minutes. The patient's level of consciousness and vital signs were monitored continuously by radiology nursing throughout the procedure under my direct supervision. COMPLICATIONS: None immediate. TECHNIQUE: Informed written consent was obtained from the patient after a discussion of the risks, benefits and alternatives to treatment. Questions regarding the procedure were encouraged and answered. Initial ultrasound scanning demonstrated unchanged appearance of bilateral parotid gland  nodules/lymph nodes with dominant right-sided parotid gland nodule measuring approximately 1.2 cm (image 1) and dominant left-sided parotid gland nodule measuring approximately 1.4 cm (image 4). Ultrasound images were saved for procedural documentation purposes. The procedure was planned. A timeout was performed prior to the initiation of the procedure. Beginning with the right-sided parotid gland nodule/lymph node, the operative was prepped and draped in the usual sterile fashion, and a sterile drape was applied covering the operative field. A timeout was performed prior to the initiation of the procedure. Local anesthesia was provided with 1% lidocaine with epinephrine. Under direct ultrasound guidance, an 18 gauge core needle device was utilized to obtain to obtain 5 core needle biopsies of the dominant right-sided parotid gland nodule/lymph. Multiple ultrasound images were saved for procedural documentation purposes. Attention was now paid towards the dominant left-sided parotid gland nodule/lymph node. The operative was prepped and draped in the usual sterile fashion, and a sterile drape was applied covering the operative field. A timeout was performed prior to the initiation of the procedure. Local anesthesia was provided with 1% lidocaine with epinephrine. Under direct ultrasound guidance, an 18 gauge core needle device was utilized to obtain to obtain 3 core needle biopsies of the dominant left-sided parotid gland nodule/lymph. Multiple ultrasound images were saved for procedural documentation purposes. The samples were placed in saline and submitted to pathology. The needle was removed and superficial hemostasis was achieved with manual compression. Post procedure scan was negative for significant hematoma. A dressing was placed. The patient tolerated the above procedures well without immediate postprocedural complication. IMPRESSION: 1. Technically successful ultrasound guided biopsy of dominant  right-sided parotid gland nodule/lymph node. 2. Technically successful ultrasound-guided biopsy of dominant  left-sided parotid gland nodule/lymph node. Electronically Signed   By: Sandi Mariscal M.D.   On: 05/04/2019 12:05   [place] Core Biopsy (salivary Gland/parotid Gland)  Result Date: 05/04/2019 INDICATION: History of lymphoma, now with indeterminate bilateral hypermetabolic parotid gland nodules/lymph nodes. Please perform ultrasound-guided biopsy for tissue diagnostic purposes. EXAM: 1. ULTRASOUND-GUIDED LEFT PAROTID GLAND NODULE/LYMPH NODE BIOPSY 2. ULTRASOUND-GUIDED RIGHT PAROTID GLAND NODULE/LYMPH NODE BIOPSY COMPARISON:  PET-CT-04/07/2019 MEDICATIONS: None ANESTHESIA/SEDATION: Moderate (conscious) sedation was employed during this procedure. A total of Versed 3 mg and Fentanyl 100 mcg was administered intravenously. Moderate Sedation Time: 25 minutes. The patient's level of consciousness and vital signs were monitored continuously by radiology nursing throughout the procedure under my direct supervision. COMPLICATIONS: None immediate. TECHNIQUE: Informed written consent was obtained from the patient after a discussion of the risks, benefits and alternatives to treatment. Questions regarding the procedure were encouraged and answered. Initial ultrasound scanning demonstrated unchanged appearance of bilateral parotid gland nodules/lymph nodes with dominant right-sided parotid gland nodule measuring approximately 1.2 cm (image 1) and dominant left-sided parotid gland nodule measuring approximately 1.4 cm (image 4). Ultrasound images were saved for procedural documentation purposes. The procedure was planned. A timeout was performed prior to the initiation of the procedure. Beginning with the right-sided parotid gland nodule/lymph node, the operative was prepped and draped in the usual sterile fashion, and a sterile drape was applied covering the operative field. A timeout was performed prior to the initiation of  the procedure. Local anesthesia was provided with 1% lidocaine with epinephrine. Under direct ultrasound guidance, an 18 gauge core needle device was utilized to obtain to obtain 5 core needle biopsies of the dominant right-sided parotid gland nodule/lymph. Multiple ultrasound images were saved for procedural documentation purposes. Attention was now paid towards the dominant left-sided parotid gland nodule/lymph node. The operative was prepped and draped in the usual sterile fashion, and a sterile drape was applied covering the operative field. A timeout was performed prior to the initiation of the procedure. Local anesthesia was provided with 1% lidocaine with epinephrine. Under direct ultrasound guidance, an 18 gauge core needle device was utilized to obtain to obtain 3 core needle biopsies of the dominant left-sided parotid gland nodule/lymph. Multiple ultrasound images were saved for procedural documentation purposes. The samples were placed in saline and submitted to pathology. The needle was removed and superficial hemostasis was achieved with manual compression. Post procedure scan was negative for significant hematoma. A dressing was placed. The patient tolerated the above procedures well without immediate postprocedural complication. IMPRESSION: 1. Technically successful ultrasound guided biopsy of dominant right-sided parotid gland nodule/lymph node. 2. Technically successful ultrasound-guided biopsy of dominant left-sided parotid gland nodule/lymph node. Electronically Signed   By: Sandi Mariscal M.D.   On: 05/04/2019 12:05     Assessment and plan- Patient is a 43 y.o. female with prior history of post transplant lymphoproliferative disorder as well as diffuse large B-cell lymphoma treated with R-CHOP in the past now presenting with bilateral parotid swellings.  Biopsy shows marginal zone extranodal mucosa associated lymphoid tissue  I discussed the results of parotid biopsy with the patient which  showed extranodal marginal zone lymphoma of mucosa associated lymphoid tissue.  She had core biopsies of both her parotid glands and both cells showed the same pathology.  These are typically slow growing  lymphomas but sometimes can transform to a higher grade diffuse large B-cell lymphoma.  Given the small size of the lymph nodes on the PET CT scan with a low SUV  uptake, normal LDH and both biopsy showing marginal zone lymphoma I do not feel that an excisional biopsy is needed at this time.  Patient is reporting some pain in the region of her left neck and given her prior history of diffuse large B-cell lymphoma I will avoid active surveillance for the small lesions.  Given that she has bilateral parotid gland involvement she at least has stage II disease and I will get a bone marrow biopsy to rule out rule out any bone marrow involvement.  Extranodal marginal zone lymphoma is of MALT can be associated with Sjogren's syndrome.  Patient does complain of symptoms of dry mouth.  I will plan to check ANA comprehensive panel as well and refer her to rheumatology for further evaluation of Sjogren's syndrome.  Discussed that stage II extranodal marginal zone lymphoma can be treated with radiation treatment versus single agent Rituxan weekly x4.  Bilateral radiation treatment to her parotid glands can further worsen her symptoms of dry mouth and I would favor weekly Rituxan over radiation for that reason.  Patient has already undergone baseline hepatitis testing.  I will tentatively plan to start weekly Rituxan in 10 days time after her bone marrow biopsy.  Discussed risks and benefits of Rituxan including all but not limited to low blood counts and risk of infections and infusion reaction.  Patient has received Rituxan in the past and tolerated it well without any significant side effects.  Treatment will be given with a curative intent.  I will tentatively see the patient back in 10 days time with a CBC with  differential, CMP, LDH and ANA comprehensive panel to start first cycle of Rituxan   Total face to face encounter time for this patient visit was 40 min. >50% of the time was  spent in counseling and coordination of care.     Visit Diagnosis 1. Goals of care, counseling/discussion   2. 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 Randa Evens, MD, MPH [facility] [phone] 05/18/2019 10:02 AM

## 2019-05-19 ENCOUNTER — Encounter: Payer: Self-pay | Admitting: Student in an Organized Health Care Education/Training Program

## 2019-05-19 ENCOUNTER — Encounter: Payer: Self-pay | Admitting: *Deleted

## 2019-05-20 ENCOUNTER — Other Ambulatory Visit: Payer: Self-pay

## 2019-05-20 ENCOUNTER — Other Ambulatory Visit: Payer: Medicaid Other

## 2019-05-20 ENCOUNTER — Encounter: Payer: Self-pay | Admitting: Family Medicine

## 2019-05-20 ENCOUNTER — Ambulatory Visit (INDEPENDENT_AMBULATORY_CARE_PROVIDER_SITE_OTHER): Payer: Medicaid Other | Admitting: Family Medicine

## 2019-05-20 VITALS — BP 134/80 | HR 61 | Temp 97.8°F | Resp 16 | Ht 66.0 in | Wt 252.1 lb

## 2019-05-20 DIAGNOSIS — M329 Systemic lupus erythematosus, unspecified: Secondary | ICD-10-CM | POA: Diagnosis not present

## 2019-05-20 DIAGNOSIS — G500 Trigeminal neuralgia: Secondary | ICD-10-CM

## 2019-05-20 DIAGNOSIS — N950 Postmenopausal bleeding: Secondary | ICD-10-CM

## 2019-05-20 DIAGNOSIS — M797 Fibromyalgia: Secondary | ICD-10-CM | POA: Diagnosis not present

## 2019-05-20 DIAGNOSIS — R300 Dysuria: Secondary | ICD-10-CM | POA: Diagnosis not present

## 2019-05-20 NOTE — Patient Instructions (Signed)
-   Recommend she take oxycodone only as needed and take only 1/2 tablet when she does need it.  Recommend she take 1/2 tablet (8m) and space out by 1-2 days, then 2-3 days, etc.

## 2019-05-20 NOTE — Progress Notes (Signed)
Name: Alison [name]   MRN: [id number]    DOB: 1976/09/27   Date:05/20/2019       Progress Note  Subjective  Chief Complaint  Chief Complaint  Patient presents with  . Urinary Tract Infection    burning when urinating, frequency  . Menstrual Problem    having heavy menstrual cycle after 1 year, started 5 days ago    HPI  Trigeminal Neuralgia and Chronic Pain: Patient has been slowly weaning herself down on her pain medications.  Please see Dr. Elwyn Lade very detailed note regarding her MME of 56 originally.  She is aware that we will not provide Rx for pain medication at this time. She has also seen neurology and was suggested to have nerve conduction study and then possible interventional procedure.  She is looking forward to this treatment.  She does ask about starting Cymbalta which we can consider today.  She is still taking Lyrica 247m TID.   Current Pain medications - Taking 467mDilaudid once daily - has about 15 tablets left. - Oxycodone - has 2 tablets left - has not taken any in the last 4 days. - She did have some night sweats last night - states this was also a symptom of her cancer before and is dealing with recurrence. - Recommend she take oxycodone only as needed and take only 1/2 tablet when she does need it.  Recommend she take 1/2 tablet (58m46mand space out by 1-2 days, then 2-3 days, etc.   Vaginal Bleeding: She had gone over 12 months without a menses, and has started having vaginal bleeding about 10 days ago started heavy like a regular period, and has started to finally taper off.  She denies abdominal pain but has had some mild cramping similar to regular periods.  She does note eating more soy-based products lately and questions if the pseudo-estrogens could be causing the bleeding. No family history ovarian or uterine cancers.  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 RaoJanese Banks She does note recurrent UTI's  about 3-4 times a year. She did have a few episodes of dysuria and 1 episodes of incontinence which she states has happened when she has UTI in the past.  Denies lightheadeness, dizziness, or near-syncope.   Patient Active Problem List   Diagnosis Date Noted  . Goals of care, counseling/discussion 05/18/2019  . 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[facility]CC[place]8/08/2019  . Fibromyalgia 03/23/2019  . Systemic lupus erythematosus (SLE) in adult (HCCT[facility] [place]6/16/2020  . Essential hypertension 03/23/2019  . Hepatitis 03/23/2019  . Mass of left kidney 03/23/2019  . Diabetes mellitus ([place]6/16/2020  . Nausea without vomiting 03/23/2019  . Neuropathy 03/23/2019  . Cancer ([facility]6/16/2020  . Hyperlipidemia 03/23/2019  . Osteoarthritis 03/23/2019  . Trigeminal neuralgia 03/23/2019  . Chronic renal disease, stage III ([place]CC[place]6/16/2020  . Nephrolithiasis 03/23/2019  . Migraine 03/23/2019  . OSA on CPAP 03/23/2019  . Fibrocystic breast 03/23/2019  . Heart murmur 03/23/2019    Past Surgical History:  Procedure Laterality Date  . BONE MARROW BIOPSY  01/13/2015  . BREAST BIOPSY  12/2014  . BREAST BIOPSY  2011  . BREAST SURGERY    . CHOLECYSTECTOMY    . HERNIA REPAIR    . infusaport    . LIVER TRANSPLANT  12/17/1991  . LUMBAR PUNCTURE    . PORT A CATH  INJECTION ([facility])    . tumor removal  2015    Family History  Problem Relation Age of Onset  . Heart disease Mother   . Hypertension Mother   . Cancer - Other Mother   . Heart disease Father   . Hypertension Father   . Diabetes Father   . Parkinson's disease Maternal Grandmother   . Cancer Maternal Aunt   . Cancer Maternal Uncle   . Cancer Maternal Grandfather   . Lupus Paternal Grandmother   . Hypertension Brother     Social History   Socioeconomic History  . Marital status: Single    Spouse name: Not on file  . Number of children:  Not on file  . Years of education: 22  . Highest education level: Not on file  Occupational History  . Occupation: disabled  Social Needs  . Financial resource strain: Hard  . Food insecurity    Worry: Often true    Inability: Often true  . Transportation needs    Medical: No    Non-medical: No  Tobacco Use  . Smoking status: Never Smoker  . Smokeless tobacco: Never Used  Substance and Sexual Activity  . Alcohol use: Never    Frequency: Never  . Drug use: Not Currently    Types: Marijuana  . Sexual activity: Not Currently  Lifestyle  . Physical activity    Days per week: 7 days    Minutes per session: 20 min  . Stress: To some extent  Relationships  . Social connections    Talks on phone: Once a week    Gets together: Three times a week    Attends religious service: More than 4 times per year    Active member of club or organization: No    Attends meetings of clubs or organizations: Never    Relationship status: Not on file  . Intimate partner violence    Fear of current or ex partner: No    Emotionally abused: No    Physically abused: No    Forced sexual activity: No  Other Topics Concern  . Not on file  Social History Narrative  . Not on file     Current Outpatient Medications:  .  baclofen (LIORESAL) 20 MG tablet, Take 1 tablet (20 mg total) by mouth 3 (three) times daily., Disp: 30 each, Rfl: 2 .  clonazePAM (KLONOPIN) 0.5 MG tablet, Take 0.5 mg by mouth at bedtime as needed for anxiety., Disp: , Rfl:  .  Cranberry 125 MG TABS, Take 1 tablet by mouth 1 day or 1 dose., Disp: , Rfl:  .  diphenhydrAMINE (BENADRYL) 50 MG capsule, Take 50 mg by mouth every 6 (six) hours as needed., Disp: , Rfl:  .  HYDROmorphone (DILAUDID) 4 MG tablet, Take 1 tablet by mouth every 6 (six) hours as needed., Disp: , Rfl:  .  lisinopril (ZESTRIL) 20 MG tablet, Take 1 tablet (20 mg total) by mouth 1 day or 1 dose., Disp: 90 tablet, Rfl: 1 .  LORazepam (ATIVAN) 1 MG tablet, Take 1  tablet by mouth at bedtime as needed., Disp: , Rfl:  .  metFORMIN (GLUCOPHAGE) 1000 MG tablet, Take 1 tablet by mouth 2 (two) times a day., Disp: , Rfl:  .  mycophenolate (CELLCEPT) 250 MG capsule, Take 1 capsule by mouth 2 (two) times a day., Disp: , Rfl:  .  oxycodone (ROXICODONE) 30 MG immediate release tablet, Take 1 tablet by mouth every 4 (four) hours as needed.,  Disp: , Rfl:  .  promethazine (PHENERGAN) 25 MG tablet, Take 1 tablet by mouth as needed., Disp: , Rfl:  .  tacrolimus (PROGRAF) 1 MG capsule, Take 1 capsule by mouth 1 day or 1 dose., Disp: , Rfl:  .  tiZANidine (ZANAFLEX) 4 MG tablet, Take 1 tablet (4 mg total) by mouth 2 (two) times daily as needed for muscle spasms., Disp: 30 tablet, Rfl: 1 .  pregabalin (LYRICA) 200 MG capsule, Take 1 capsule (200 mg total) by mouth 3 (three) times daily for 30 days., Disp: 90 capsule, Rfl: 1  Allergies  Allergen Reactions  . Carbamazepine Other (See Comments)    Medication interaction-prograf  . Hydrocodone-Acetaminophen Itching  . Naproxen Itching  . Penicillin G Itching and Rash    I personally reviewed active problem list, medication list, allergies, notes from last encounter, lab results with the patient/caregiver today.   ROS  Constitutional: Negative for fever or weight change.  Respiratory: Negative for cough and shortness of breath.   Cardiovascular: Negative for chest pain or palpitations.  Gastrointestinal: Negative for abdominal pain, no bowel changes.  Musculoskeletal: Negative for gait problem or joint swelling.  Skin: Negative for rash.  Neurological: Negative for dizziness or headache.  No other specific complaints in a complete review of systems (except as listed in HPI above)  Objective  Vitals:   05/20/19 1132  BP: 134/80  Pulse: 61  Resp: 16  Temp: 97.8 F (36.6 C)  TempSrc: Oral  SpO2: 96%  Weight: 252 lb 1.6 oz (114.4 kg)  Height: _0  (1.676 m)   Body mass index is 40.69 kg/m.  Physical Exam   Constitutional: Patient appears well-developed and well-nourished. No distress.  HENT: Head: Normocephalic and atraumatic.  Eyes: Conjunctivae and EOM are normal. No scleral icterus.  Neck: Normal range of motion. Neck supple. No JVD present. No thyromegaly present.  Cardiovascular: Normal rate, regular rhythm and normal heart sounds.  No murmur heard. No BLE edema. Pulmonary/Chest: Effort normal and breath sounds normal. No respiratory distress. Abdominal: Soft. Bowel sounds are normal, no distension. There is no tenderness. No masses. Musculoskeletal: Normal range of motion, no joint effusions. No gross deformities Neurological: Pt is alert and oriented to person, place, and time. No cranial nerve deficit. Coordination, balance, strength, speech and gait are normal.  Skin: Skin is warm and dry. No rash noted. No erythema.  Psychiatric: Patient has a normal mood and affect. behavior is normal. Judgment and thought content normal.  No results found for this or any previous visit (from the past 72 hour(s)).  PHQ2/9: Depression screen [facility] 2/9 05/20/2019 04/05/2019  Decreased Interest 1 3  Down, Depressed, Hopeless 1 3  PHQ - 2 Score 2 6  Altered sleeping 1 3  Tired, decreased energy 1 3  Change in appetite 1 1  Feeling bad or failure about yourself  0 2  Trouble concentrating 1 2  Moving slowly or fidgety/restless 0 1  Suicidal thoughts 0 0  PHQ-9 Score 6 18  Difficult doing work/chores Somewhat difficult Somewhat difficult   PHQ-2/9 Result is positive.    Fall Risk: Fall Risk  05/20/2019 04/05/2019  Falls in the past year? 1 1  Number falls in past yr: 1 1  Injury with Fall? 0 0  Risk for fall due to : History of fall(s) -  Follow up Falls evaluation completed -   Assessment & Plan  1. Abnormal vaginal bleeding in postmenopausal patient - Ambulatory referral to Gynecology - Urine  Culture - Urinalysis  2. Fibromyalgia - Taking Tizanidine per Dr. Holley Raring and this does seem  to be helping with her pain and withdrawal symptoms. - Recommend she take oxycodone only as needed and take only 1/2 tablet when she does need it.  Recommend she take 1/2 tablet (15m) and space out by 1-2 days, then 2-3 days, etc.  3. Systemic lupus erythematosus (SLE) in adult ([facility] - See above  4. Trigeminal neuralgia - Recommend she take oxycodone only as needed and take only 1/2 tablet when she does need it.  Recommend she take 1/2 tablet (238m and space out by 1-2 days, then 2-3 days, etc. - Continue with Neurology  5. Dysuria - Urine Culture - Urinalysis  Face-to-face time with patient was more than 25 minutes, >50% time spent counseling and coordination of care.

## 2019-05-20 NOTE — Progress Notes (Addendum)
Tumor Board Documentation  Alison [name] was presented by Dr Janese Banks at our Tumor Board on 05/20/2019, which included representatives from medical oncology, surgical, pathology, radiology, pharmacy, internal medicine, navigation, research, palliative care.  Alison [name] currently presents as a current patient, for Alison [name], for new positive pathology with history of the following treatments: active survellience, surgical intervention(s), neoadjuvant chemotherapy.  Additionally, we reviewed previous medical and familial history, history of present illness, and recent lab results along with all available histopathologic and imaging studies. The tumor board considered available treatment options and made the following recommendations: Single agent rituxan Referral to Rheumatology  The following procedures/referrals were also placed: No orders of the defined types were placed in this encounter.   Clinical Trial Status: not discussed   Staging used: AJCC Stage Group  AJCC Staging:       Group: Stage 2 Extranodal Marginal Zone Lymphoma   National site-specific guidelines NCCN were discussed with respect to the case.  Tumor board is a meeting of clinicians from various specialty areas who evaluate and discuss patients for whom a multidisciplinary approach is being considered. Final determinations in the plan of care are those of the provider(s). The responsibility for follow up of recommendations given during tumor board is that of the provider.   Today's extended care, comprehensive team conference, Alison [name] was not present for the discussion and was not examined.   Multidisciplinary Tumor Board is a multidisciplinary case peer review process.  Decisions discussed in the Multidisciplinary Tumor Board reflect the opinions of the specialists present at the conference without having examined the patient.  Ultimately, treatment and diagnostic decisions rest with the primary provider(s) and the patient.

## 2019-05-21 ENCOUNTER — Other Ambulatory Visit: Payer: Self-pay | Admitting: Family Medicine

## 2019-05-21 ENCOUNTER — Encounter: Payer: Medicaid Other | Admitting: Podiatry

## 2019-05-21 DIAGNOSIS — N3001 Acute cystitis with hematuria: Secondary | ICD-10-CM

## 2019-05-21 MED ORDER — SULFAMETHOXAZOLE-TRIMETHOPRIM 800-160 MG PO TABS: 1.0000 | ORAL_TABLET | Freq: Two times a day (BID) | ORAL | 0 refills | Status: AC

## 2019-05-22 ENCOUNTER — Encounter: Payer: Self-pay | Admitting: Oncology

## 2019-05-22 LAB — URINE CULTURE
MICRO NUMBER:: 769951
SPECIMEN QUALITY:: ADEQUATE

## 2019-05-22 LAB — URINALYSIS
Bilirubin Urine: NEGATIVE
Glucose, UA: NEGATIVE
Ketones, ur: NEGATIVE
Nitrite: POSITIVE — AB
Specific Gravity, Urine: 1.017 (ref 1.001–1.03)
pH: 6.5 (ref 5.0–8.0)

## 2019-05-24 ENCOUNTER — Encounter: Payer: Self-pay | Admitting: Family Medicine

## 2019-05-24 ENCOUNTER — Other Ambulatory Visit: Payer: Self-pay

## 2019-05-24 ENCOUNTER — Other Ambulatory Visit: Payer: Self-pay | Admitting: Student

## 2019-05-24 ENCOUNTER — Ambulatory Visit (INDEPENDENT_AMBULATORY_CARE_PROVIDER_SITE_OTHER): Payer: Medicaid Other | Admitting: Obstetrics and Gynecology

## 2019-05-24 ENCOUNTER — Encounter: Payer: Self-pay | Admitting: Obstetrics and Gynecology

## 2019-05-24 VITALS — BP 132/84 | HR 78 | Ht 66.0 in | Wt 245.0 lb

## 2019-05-24 DIAGNOSIS — N939 Abnormal uterine and vaginal bleeding, unspecified: Secondary | ICD-10-CM | POA: Diagnosis not present

## 2019-05-24 NOTE — Progress Notes (Signed)
Gynecology Abnormal Uterine Bleeding Initial Evaluation   Chief Complaint:  Chief Complaint  Patient presents with  . Postmenapausal bleeding    Referred by [facility]    History of Present Illness:    Paitient is a 43 y.o. G0P0000 who LMP was Patient's last menstrual period was 02/04/2018 (approximate)., presents today in consultation at the request of Raelyn Ensign, FNP at [facility] .  She complains of irreglar that  began about a month ago and its severity is described as mild.  The patient menstrual complaints are acute.  Despite her age she has had a 12 month period of amenorrhea so meets criteria for working this up for postmenopausal bleeding.  The patient's history if significant for prior B-cell lymphoma treated with chemotherapy (no mantle radiation,  In addition she has had a liver transplant.   No history of prior abnormal paps.  Previous evaluation: none to date specifically for her bleeding but PET on 04/07/2019 without increased uptake in the pelvis..  Previous Treatment: none  Review of Systems: Review of Systems  Constitutional: Negative.   Gastrointestinal: Negative.   Genitourinary: Negative.   Skin: Negative.     Past Medical History:  Past Medical History:  Diagnosis Date  . Abnormal uterine bleeding   . Bipolar disorder (manic depression) ([place])   . Cancer ([place])   . Chronic kidney failure   . Chronic renal disease, stage III ([place])   . Diabetes mellitus without complication ([place])   . 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[place])   . FH: trigeminal neuralgia   . Hypertension   . Kidney mass   . Lupus ([place])   . Lupus ([place])   . Lymphoma ([place])   . Lymphoma ([place])   . Major depressive disorder   . Migraine   . Morbid obesity ([place])   . Neuropathy   . Post herpetic neuralgia   . Renal disorder   . S/P liver transplant [facility])     Past Surgical History:  Past Surgical History:  Procedure Laterality Date  . BONE MARROW BIOPSY  01/13/2015  .  BREAST BIOPSY  12/2014  . BREAST BIOPSY  2011  . BREAST SURGERY    . CHOLECYSTECTOMY    . HERNIA REPAIR    . infusaport    . LIVER TRANSPLANT  12/17/1991  . LUMBAR PUNCTURE    . PORT A CATH INJECTION ([facility]X)    . tumor removal  2015    Obstetric History: G0P0000  Family History:  Family History  Problem Relation Age of Onset  . Heart disease Mother   . Hypertension Mother   . Cancer - Other Mother   . Heart disease Father   . Hypertension Father   . Diabetes Father   . Parkinson's disease Maternal Grandmother   . Cancer Maternal Aunt   . Cancer Maternal Uncle   . Cancer Maternal Grandfather   . Lupus Paternal Grandmother   . Hypertension Brother     Social History:  Social History   Socioeconomic History  . Marital status: Single    Spouse name: Not on file  . Number of children: Not on file  . Years of education: 62  . Highest education level: Not on file  Occupational History  . Occupation: disabled  Social Needs  . Financial resource strain: Hard  . Food insecurity    Worry: Often true    Inability: Often true  . Transportation needs    Medical: No  Non-medical: No  Tobacco Use  . Smoking status: Never Smoker  . Smokeless tobacco: Never Used  Substance and Sexual Activity  . Alcohol use: Never    Frequency: Never  . Drug use: Not Currently    Types: Marijuana  . Sexual activity: Not Currently  Lifestyle  . Physical activity    Days per week: 7 days    Minutes per session: 20 min  . Stress: To some extent  Relationships  . Social connections    Talks on phone: Once a week    Gets together: Three times a week    Attends religious service: More than 4 times per year    Active member of club or organization: No    Attends meetings of clubs or organizations: Never    Relationship status: Not on file  . Intimate partner violence    Fear of current or ex partner: No    Emotionally abused: No    Physically abused: No    Forced sexual activity:  No  Other Topics Concern  . Not on file  Social History Narrative  . Not on file    Allergies:  Allergies  Allergen Reactions  . Carbamazepine Other (See Comments)    Medication interaction-prograf  . Hydrocodone-Acetaminophen Itching  . Naproxen Itching  . Penicillin G Itching and Rash    Medications: Prior to Admission medications   Medication Sig Start Date End Date Taking? Authorizing Provider  baclofen (LIORESAL) 20 MG tablet Take 1 tablet (20 mg total) by mouth 3 (three) times daily. 04/05/19  Yes [name], FNP  Cranberry 125 MG TABS Take 1 tablet by mouth 1 day or 1 dose.   Yes [provider]  diphenhydrAMINE (BENADRYL) 50 MG capsule Take 50 mg by mouth every 6 (six) hours as needed.   Yes [provider]  HYDROmorphone (DILAUDID) 4 MG tablet Take 1 tablet by mouth every 6 (six) hours as needed.   Yes [provider]  lisinopril (ZESTRIL) 20 MG tablet Take 1 tablet (20 mg total) by mouth 1 day or 1 dose. 04/05/19  Yes [name], FNP  LORazepam (ATIVAN) 1 MG tablet Take 1 tablet by mouth at bedtime as needed.   Yes [provider]  metFORMIN (GLUCOPHAGE) 1000 MG tablet Take 1 tablet by mouth 2 (two) times a day.   Yes [provider]  mycophenolate (CELLCEPT) 250 MG capsule Take 1 capsule by mouth 2 (two) times a day.   Yes [provider]  oxycodone (ROXICODONE) 30 MG immediate release tablet Take 1 tablet by mouth every 4 (four) hours as needed.   Yes [provider]  promethazine (PHENERGAN) 25 MG tablet Take 1 tablet by mouth as needed.   Yes [provider]  sulfamethoxazole-trimethoprim (BACTRIM DS) 800-160 MG tablet Take 1 tablet by mouth 2 (two) times daily for 5 days. 05/21/19 05/26/19 Yes [name], FNP  tacrolimus (PROGRAF) 1 MG capsule Take 1 capsule by mouth 1 day or 1 dose.   Yes [provider]  tiZANidine (ZANAFLEX) 4 MG tablet Take 1 tablet (4 mg total) by mouth 2 (two)  times daily as needed for muscle spasms. 05/18/19 05/17/20 Yes [name], MD  pregabalin (LYRICA) 200 MG capsule Take 1 capsule (200 mg total) by mouth 3 (three) times daily for 30 days. 04/16/19 05/18/19  [name], FNP    Physical Exam Blood pressure 132/84, pulse 78, height 5' 6"  (1.676 m), weight 245 lb (111.1  kg), last menstrual period 02/04/2018. Body mass index is 39.54 kg/m.   Patient's last menstrual period was 02/04/2018 (approximate).  General: NAD, appears stated age, well nourished  HEENT: normocephalic, anicteric Pulmonary: No increased work of breathing Neurologic: Grossly intact Psychiatric: mood appropriate, affect full  Female chaperone present for pelvic portions of the physical exam  Assessment: 43 y.o. G0P0000 with abnormal uterine bleeding  Plan: Problem List Items Addressed This Visit      Genitourinary   Abnormal uterine bleeding - Primary   Relevant Orders   TSH+Prl+FSH+TestT+LH+DHEA S...   Estradiol   US Transvaginal Non-OB      1) AUB/PMB - will obtain lab work to assess ovarian function.  We discussed that chemotherapy, particularly alkylating agents are cytotoxic to the ovary and predispose to early menopause.  However, they may also suppress ovarian function initially with some eventual recovery.  The risk of endometrial hyperplasia is linearly correlated with increasing BMI given the production of estrone by adipose tissue.  - Hormones to assess postmenopausal status - Pap and possible endometrial biopsy at time of follow up - Ultrasound to evaluate endometrial stripe and for presence of any other structural lesions such as fibroids or polyps  2) A total of 20 minutes were spent in face-to-face contact with the patient during this encounter with over half of that time devoted to counseling and coordination of care.  3) Return in about 1 week (around 05/31/2019) for GYN visit and TVUS.   Malachy Mood, MD, [facility], [facility] 05/24/2019, 2:29 PM

## 2019-05-25 ENCOUNTER — Other Ambulatory Visit (HOSPITAL_COMMUNITY)
Admission: RE | Admit: 2019-05-25 | Disposition: A | Discharge status: Home / Self Care | Payer: Medicaid Other | Source: Ambulatory Visit | Attending: Oncology | Admitting: Oncology

## 2019-05-25 ENCOUNTER — Ambulatory Visit
Admission: RE | Admit: 2019-05-25 | Discharge: 2019-05-25 | Disposition: A | Discharge status: Home / Self Care | Payer: Medicaid Other | Source: Ambulatory Visit | Attending: Oncology | Admitting: Oncology

## 2019-05-25 DIAGNOSIS — C884 Extranodal marginal zone B-cell lymphoma of mucosa-associated lymphoid tissue [MALT-lymphoma]: Secondary | ICD-10-CM | POA: Diagnosis not present

## 2019-05-25 DIAGNOSIS — C8590 Non-Hodgkin lymphoma, unspecified, unspecified site: Secondary | ICD-10-CM | POA: Diagnosis not present

## 2019-05-25 DIAGNOSIS — D704 Cyclic neutropenia: Secondary | ICD-10-CM | POA: Insufficient documentation

## 2019-05-25 DIAGNOSIS — D696 Thrombocytopenia, unspecified: Secondary | ICD-10-CM | POA: Diagnosis not present

## 2019-05-25 LAB — CBC WITH DIFFERENTIAL/PLATELET
Abs Immature Granulocytes: 0.03 10*3/uL (ref 0.00–0.07)
Basophils Absolute: 0.1 10*3/uL (ref 0.0–0.1)
Basophils Relative: 1 %
Eosinophils Absolute: 0.2 10*3/uL (ref 0.0–0.5)
Eosinophils Relative: 3 %
HCT: 45.9 % (ref 36.0–46.0)
Hemoglobin: 15 g/dL (ref 12.0–15.0)
Immature Granulocytes: 0 %
Lymphocytes Relative: 41 %
Lymphs Abs: 3.3 10*3/uL (ref 0.7–4.0)
MCH: 29 pg (ref 26.0–34.0)
MCHC: 32.7 g/dL (ref 30.0–36.0)
MCV: 88.6 fL (ref 80.0–100.0)
Monocytes Absolute: 0.7 10*3/uL (ref 0.1–1.0)
Monocytes Relative: 8 %
Neutro Abs: 3.8 10*3/uL (ref 1.7–7.7)
Neutrophils Relative %: 47 %
Platelets: 149 10*3/uL — ABNORMAL LOW (ref 150–400)
RBC: 5.18 MIL/uL — ABNORMAL HIGH (ref 3.87–5.11)
RDW: 13.7 % (ref 11.5–15.5)
WBC: 8.1 10*3/uL (ref 4.0–10.5)
nRBC: 0 % (ref 0.0–0.2)

## 2019-05-25 LAB — PROTIME-INR
INR: 0.9 (ref 0.8–1.2)
Prothrombin Time: 11.8 seconds (ref 11.4–15.2)

## 2019-05-25 LAB — GLUCOSE, CAPILLARY: Glucose-Capillary: 102 mg/dL — ABNORMAL HIGH (ref 70–99)

## 2019-05-25 MED ORDER — FENTANYL CITRATE (PF) 100 MCG/2ML IJ SOLN
INTRAMUSCULAR | Status: AC
  Filled 2019-05-25: qty 4

## 2019-05-25 MED ORDER — FENTANYL CITRATE (PF) 100 MCG/2ML IJ SOLN
INTRAMUSCULAR | Status: AC | PRN
  Administered 2019-05-25 (×2): 50 ug via INTRAVENOUS

## 2019-05-25 MED ORDER — HEPARIN SOD (PORK) LOCK FLUSH 100 UNIT/ML IV SOLN
INTRAVENOUS | Status: AC
  Filled 2019-05-25: qty 5

## 2019-05-25 MED ORDER — MIDAZOLAM HCL 2 MG/2ML IJ SOLN
INTRAMUSCULAR | Status: AC | PRN
  Administered 2019-05-25: 1 mg via INTRAVENOUS
  Administered 2019-05-25: 2 mg via INTRAVENOUS
  Administered 2019-05-25: 1 mg via INTRAVENOUS

## 2019-05-25 MED ORDER — MIDAZOLAM HCL 5 MG/5ML IJ SOLN
INTRAMUSCULAR | Status: AC
  Filled 2019-05-25: qty 5

## 2019-05-25 MED ORDER — SODIUM CHLORIDE 0.9 % IV SOLN
INTRAVENOUS | Status: DC
  Administered 2019-05-25: 09:00:00 via INTRAVENOUS

## 2019-05-25 NOTE — Progress Notes (Signed)
Patient clinically stable post BMB per Dr Pascal Lux, tolerated well. Received Versed 63m IV along with Fentanyl 100 mcg IV for procedure. Denies complaints at this time. Sinus rhythm per monitor. Called Brother in lDevelopment worker, communitywith questions answered regarding procedure with discharge instructions given,

## 2019-05-25 NOTE — Discharge Instructions (Signed)
Moderate Conscious Sedation, Adult, Care After °These instructions provide you with information about caring for yourself after your procedure. Your health care provider may also give you more specific instructions. Your treatment has been planned according to current medical practices, but problems sometimes occur. Call your health care provider if you have any problems or questions after your procedure. °What can I expect after the procedure? °After your procedure, it is common: °· To feel sleepy for several hours. °· To feel clumsy and have poor balance for several hours. °· To have poor judgment for several hours. °· To vomit if you eat too soon. °Follow these instructions at home: °For at least 24 hours after the procedure: ° °· Do not: °? Participate in activities where you could fall or become injured. °? Drive. °? Use heavy machinery. °? Drink alcohol. °? Take sleeping pills or medicines that cause drowsiness. °? Make important decisions or sign legal documents. °? Take care of children on your own. °· Rest. °Eating and drinking °· Follow the diet recommended by your health care provider. °· If you vomit: °? Drink water, juice, or soup when you can drink without vomiting. °? Make sure you have little or no nausea before eating solid foods. °General instructions °· Have a responsible adult stay with you until you are awake and alert. °· Take over-the-counter and prescription medicines only as told by your health care provider. °· If you smoke, do not smoke without supervision. °· Keep all follow-up visits as told by your health care provider. This is important. °Contact a health care provider if: °· You keep feeling nauseous or you keep vomiting. °· You feel light-headed. °· You develop a rash. °· You have a fever. °Get help right away if: °· You have trouble breathing. °This information is not intended to replace advice given to you by your health care provider. Make sure you discuss any questions you have  with your health care provider. °Document Released: 07/14/2013 Document Revised: 09/05/2017 Document Reviewed: 01/13/2016 °Elsevier Patient Education © 2020 [employer] ° ° °Bone Marrow Aspiration and Bone Marrow Biopsy, Adult, Care After °This sheet gives you information about how to care for yourself after your procedure. Your health care provider may also give you more specific instructions. If you have problems or questions, contact your health care provider. °What can I expect after the procedure? °After the procedure, it is common to have: °· Mild pain and tenderness. °· Swelling. °· Bruising. °Follow these instructions at home: °Puncture site care ° °  ° °· Follow instructions from your health care provider about how to take care of the puncture site. Make sure you: °? Wash your hands with soap and water before you change your bandage (dressing). If soap and water are not available, use hand sanitizer. °? Change your dressing as told by your health care provider. °· Check your puncture site every day for signs of infection. Check for: °? More redness, swelling, or pain. °? More fluid or blood. °? Warmth. °? Pus or a bad smell. °General instructions °· Take over-the-counter and prescription medicines only as told by your health care provider. °· Do not take baths, swim, or use a hot tub until your health care provider approves. Ask if you can take a shower or have a sponge bath. °· Return to your normal activities as told by your health care provider. Ask your health care provider what activities are safe for you. °· Do not drive for 24 hours if you were   given a medicine to help you relax (sedative) during your procedure. °· Keep all follow-up visits as told by your health care provider. This is important. °Contact a health care provider if: °· Your pain is not controlled with medicine. °Get help right away if: °· You have a fever. °· You have more redness, swelling, or pain around the puncture site. °· You  have more fluid or blood coming from the puncture site. °· Your puncture site feels warm to the touch. °· You have pus or a bad smell coming from the puncture site. °These symptoms may represent a serious problem that is an emergency. Do not wait to see if the symptoms will go away. Get medical help right away. Call your local emergency services (911 in the [place] Do not drive yourself to the hospital. °Summary °· After the procedure, it is common to have mild pain, tenderness, swelling, and bruising. °· Follow instructions from your health care provider about how to take care of the puncture site. °· Get help right away if you have any symptoms of infection or if you have more blood or fluid coming from the puncture site. °This information is not intended to replace advice given to you by your health care provider. Make sure you discuss any questions you have with your health care provider. °Document Released: 04/12/2005 Document Revised: 01/06/2018 Document Reviewed: 03/06/2016 °Elsevier Patient Education © 2020 [employer] ° ° °

## 2019-05-25 NOTE — Procedures (Signed)
Pre-procedure Diagnosis: Lymphoma Post-procedure Diagnosis: Same  Technically successful CT guided bone marrow aspiration and biopsy of left iliac crest.   Complications: None Immediate  EBL: None  Signed: Sandi Mariscal Pager: [phone] 05/25/2019, 10:02 AM

## 2019-05-25 NOTE — Consult Note (Signed)
Chief Complaint: Marginal Zone Lymphoma  Referring Physician(s): Rao,Archana C  Patient Status: [facility] - Out-pt  History of Present Illness: Alison [name] is a 43 y.o. 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  Patient is in her baseline state of health and is currently without acute complaint.  Specifically, no chest pain, shortness of breath, fever or chills.  Past Medical History:  Diagnosis Date   Abnormal uterine bleeding    Bipolar disorder (manic depression) (HCC)    Cancer (HCC)    Chronic kidney failure    Chronic renal disease, stage III (HCC)    Diabetes mellitus without complication (HCC)    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HCC)    FH: trigeminal neuralgia    Hypertension    Kidney mass    Lupus (HCC)    Lupus (HCC)    Lymphoma (HCC)    Lymphoma (HCC)    Major depressive disorder    Migraine    Morbid obesity ([place])    Neuropathy    Post herpetic neuralgia    Renal disorder    S/P liver transplant [facility])     Past Surgical History:  Procedure Laterality Date   BONE MARROW BIOPSY  01/13/2015   BREAST BIOPSY  12/2014   BREAST BIOPSY  2011   BREAST SURGERY     CHOLECYSTECTOMY     HERNIA REPAIR     infusaport     LIVER TRANSPLANT  12/17/1991   LUMBAR PUNCTURE     PORT A CATH INJECTION ([facility] HX)     tumor removal  2015    Allergies: Carbamazepine, Hydrocodone-acetaminophen, Naproxen, and Penicillin g  Medications: Prior to Admission medications   Medication Sig Start Date End Date Taking? Authorizing Provider  baclofen (LIORESAL) 20 MG tablet Take 1 tablet (20 mg total) by mouth 3 (three) times daily. 04/05/19  Yes [name], FNP  Cranberry 125 MG TABS  Take 1 tablet by mouth 1 day or 1 dose.   Yes [provider]  diphenhydrAMINE (BENADRYL) 50 MG capsule Take 50 mg by mouth every 6 (six) hours as needed.   Yes [provider]  HYDROmorphone (DILAUDID) 4 MG tablet Take 1 tablet by mouth every 6 (six) hours as needed.   Yes [provider]  lisinopril (ZESTRIL) 20 MG tablet Take 1 tablet (20 mg total) by mouth 1 day or 1 dose. 04/05/19  Yes [name], FNP  LORazepam (ATIVAN) 1 MG tablet Take 1 tablet by mouth at bedtime as needed.   Yes [provider]  metFORMIN (GLUCOPHAGE) 1000 MG tablet Take 1 tablet by mouth 2 (two) times a day.   Yes [provider]  oxycodone (ROXICODONE) 30 MG immediate release tablet Take 1 tablet by mouth every 4 (four) hours as needed.   Yes [provider]  promethazine (PHENERGAN) 25 MG tablet Take 1 tablet by mouth as needed.   Yes [provider]  sulfamethoxazole-trimethoprim (BACTRIM DS) 800-160 MG tablet Take 1 tablet by mouth 2 (two) times daily for 5 days. 05/21/19 05/26/19 Yes [name], FNP  tacrolimus (PROGRAF) 1 MG capsule Take 1 capsule by mouth 1 day or 1 dose.   Yes [provider]  tiZANidine (ZANAFLEX) 4 MG tablet Take 1 tablet (4 mg total) by mouth 2 (  two) times daily as needed for muscle spasms. 05/18/19 05/17/20 Yes [name], MD  mycophenolate (CELLCEPT) 250 MG capsule Take 1 capsule by mouth 2 (two) times a day.    [provider]  pregabalin (LYRICA) 200 MG capsule Take 1 capsule (200 mg total) by mouth 3 (three) times daily for 30 days. 04/16/19 05/18/19  [name], FNP     Family History  Problem Relation Age of Onset   Heart disease Mother    Hypertension Mother    Cancer - Other Mother    Heart disease Father    Hypertension Father    Diabetes Father    Parkinson's disease Maternal Grandmother    Cancer Maternal Aunt    Cancer Maternal Uncle    Cancer Maternal Grandfather    Lupus  Paternal Grandmother    Hypertension Brother     Social History   Socioeconomic History   Marital status: Single    Spouse name: Not on file   Number of children: Not on file   Years of education: 9   Highest education level: Not on file  Occupational History   Occupation: disabled  Social Designer, fashion/clothing strain: Hard   Food insecurity    Worry: Often true    Inability: Often true   Transportation needs    Medical: No    Non-medical: No  Tobacco Use   Smoking status: Never Smoker   Smokeless tobacco: Never Used  Substance and Sexual Activity   Alcohol use: Never    Frequency: Never   Drug use: Not Currently    Types: Marijuana   Sexual activity: Not Currently  Lifestyle   Physical activity    Days per week: 7 days    Minutes per session: 20 min   Stress: To some extent  Relationships   Social connections    Talks on phone: Once a week    Gets together: Three times a week    Attends religious service: More than 4 times per year    Active member of club or organization: No    Attends meetings of clubs or organizations: Never    Relationship status: Not on file  Other Topics Concern   Not on file  Social History Narrative   Not on file    ECOG Status: 1 - Symptomatic but completely ambulatory  Review of Systems: A 12 point ROS discussed and pertinent positives are indicated in the HPI above.  All other systems are negative.  Review of Systems  Constitutional: Negative for activity change, fatigue and fever.  Respiratory: Negative.   Cardiovascular: Negative.     Vital Signs: BP 129/79    Pulse (!) 56    Temp 98.8 F (37.1 C) (Oral)    Resp 20    Ht 5' 6"  (1.676 m)    Wt 111.1 kg    LMP 02/04/2018 (Approximate) Comment: Per patient, over a year ago   SpO2 100%    BMI 39.54 kg/m   Physical Exam Vitals signs and nursing note reviewed. Exam conducted with a chaperone present.  Constitutional:      Appearance: She is obese.    Cardiovascular:     Rate and Rhythm: Normal rate.  Pulmonary:     Effort: Pulmonary effort is normal.     Breath sounds: Normal breath sounds.  Neurological:     Mental Status: She is alert.  Psychiatric:        Mood and Affect: Mood normal.  Behavior: Behavior normal.        Thought Content: Thought content normal.        Judgment: Judgment normal.     Imaging: US Soft Tissue Head & Neck (non-thyroid)  Result Date: 05/04/2019 CLINICAL DATA:  History of lymphoma now with hypermetabolic bilateral parotid nodules. EXAM: ULTRASOUND OF HEAD/NECK SOFT TISSUES TECHNIQUE: Ultrasound examination of the head and neck soft tissues was performed in the area of clinical concern. COMPARISON:  PET-CT - 04/07/2019 FINDINGS: Sonographic evaluation of the left parotid gland demonstrates a well-defined approximately 1.4 x 1.3 x 0.8 cm hypoechoic suspected lymph node likely within the peripheral aspect the left parotid gland. Sonographic evaluation of the right parotid gland demonstrates a similar appearing approximately 1.2 x 1.1 x 0.7 cm suspected lymph node also likely within the peripheral aspect of the right parotid gland. IMPRESSION: Sonographic evaluation demonstrates prominent though non pathologically enlarged bilateral parotid gland lymph nodes. Patient subsequent underwent ultrasound-guided biopsy of both of the parotid gland nodules/lymph nodes. Electronically Signed   By: Sandi Mariscal M.D.   On: 05/04/2019 11:58   [place] Core Biopsy (salivary Gland/parotid Gland)  Result Date: 05/04/2019 INDICATION: History of lymphoma, now with indeterminate bilateral hypermetabolic parotid gland nodules/lymph nodes. Please perform ultrasound-guided biopsy for tissue diagnostic purposes. EXAM: 1. ULTRASOUND-GUIDED LEFT PAROTID GLAND NODULE/LYMPH NODE BIOPSY 2. ULTRASOUND-GUIDED RIGHT PAROTID GLAND NODULE/LYMPH NODE BIOPSY COMPARISON:  PET-CT-04/07/2019 MEDICATIONS: None ANESTHESIA/SEDATION: Moderate (conscious)  sedation was employed during this procedure. A total of Versed 3 mg and Fentanyl 100 mcg was administered intravenously. Moderate Sedation Time: 25 minutes. The patient's level of consciousness and vital signs were monitored continuously by radiology nursing throughout the procedure under my direct supervision. COMPLICATIONS: None immediate. TECHNIQUE: Informed written consent was obtained from the patient after a discussion of the risks, benefits and alternatives to treatment. Questions regarding the procedure were encouraged and answered. Initial ultrasound scanning demonstrated unchanged appearance of bilateral parotid gland nodules/lymph nodes with dominant right-sided parotid gland nodule measuring approximately 1.2 cm (image 1) and dominant left-sided parotid gland nodule measuring approximately 1.4 cm (image 4). Ultrasound images were saved for procedural documentation purposes. The procedure was planned. A timeout was performed prior to the initiation of the procedure. Beginning with the right-sided parotid gland nodule/lymph node, the operative was prepped and draped in the usual sterile fashion, and a sterile drape was applied covering the operative field. A timeout was performed prior to the initiation of the procedure. Local anesthesia was provided with 1% lidocaine with epinephrine. Under direct ultrasound guidance, an 18 gauge core needle device was utilized to obtain to obtain 5 core needle biopsies of the dominant right-sided parotid gland nodule/lymph. Multiple ultrasound images were saved for procedural documentation purposes. Attention was now paid towards the dominant left-sided parotid gland nodule/lymph node. The operative was prepped and draped in the usual sterile fashion, and a sterile drape was applied covering the operative field. A timeout was performed prior to the initiation of the procedure. Local anesthesia was provided with 1% lidocaine with epinephrine. Under direct ultrasound  guidance, an 18 gauge core needle device was utilized to obtain to obtain 3 core needle biopsies of the dominant left-sided parotid gland nodule/lymph. Multiple ultrasound images were saved for procedural documentation purposes. The samples were placed in saline and submitted to pathology. The needle was removed and superficial hemostasis was achieved with manual compression. Post procedure scan was negative for significant hematoma. A dressing was placed. The patient tolerated the above procedures well without immediate postprocedural complication.  IMPRESSION: 1. Technically successful ultrasound guided biopsy of dominant right-sided parotid gland nodule/lymph node. 2. Technically successful ultrasound-guided biopsy of dominant left-sided parotid gland nodule/lymph node. Electronically Signed   By: Sandi Mariscal M.D.   On: 05/04/2019 12:05   [place] Core Biopsy (salivary Gland/parotid Gland)  Result Date: 05/04/2019 INDICATION: History of lymphoma, now with indeterminate bilateral hypermetabolic parotid gland nodules/lymph nodes. Please perform ultrasound-guided biopsy for tissue diagnostic purposes. EXAM: 1. ULTRASOUND-GUIDED LEFT PAROTID GLAND NODULE/LYMPH NODE BIOPSY 2. ULTRASOUND-GUIDED RIGHT PAROTID GLAND NODULE/LYMPH NODE BIOPSY COMPARISON:  PET-CT-04/07/2019 MEDICATIONS: None ANESTHESIA/SEDATION: Moderate (conscious) sedation was employed during this procedure. A total of Versed 3 mg and Fentanyl 100 mcg was administered intravenously. Moderate Sedation Time: 25 minutes. The patient's level of consciousness and vital signs were monitored continuously by radiology nursing throughout the procedure under my direct supervision. COMPLICATIONS: None immediate. TECHNIQUE: Informed written consent was obtained from the patient after a discussion of the risks, benefits and alternatives to treatment. Questions regarding the procedure were encouraged and answered. Initial ultrasound scanning demonstrated unchanged  appearance of bilateral parotid gland nodules/lymph nodes with dominant right-sided parotid gland nodule measuring approximately 1.2 cm (image 1) and dominant left-sided parotid gland nodule measuring approximately 1.4 cm (image 4). Ultrasound images were saved for procedural documentation purposes. The procedure was planned. A timeout was performed prior to the initiation of the procedure. Beginning with the right-sided parotid gland nodule/lymph node, the operative was prepped and draped in the usual sterile fashion, and a sterile drape was applied covering the operative field. A timeout was performed prior to the initiation of the procedure. Local anesthesia was provided with 1% lidocaine with epinephrine. Under direct ultrasound guidance, an 18 gauge core needle device was utilized to obtain to obtain 5 core needle biopsies of the dominant right-sided parotid gland nodule/lymph. Multiple ultrasound images were saved for procedural documentation purposes. Attention was now paid towards the dominant left-sided parotid gland nodule/lymph node. The operative was prepped and draped in the usual sterile fashion, and a sterile drape was applied covering the operative field. A timeout was performed prior to the initiation of the procedure. Local anesthesia was provided with 1% lidocaine with epinephrine. Under direct ultrasound guidance, an 18 gauge core needle device was utilized to obtain to obtain 3 core needle biopsies of the dominant left-sided parotid gland nodule/lymph. Multiple ultrasound images were saved for procedural documentation purposes. The samples were placed in saline and submitted to pathology. The needle was removed and superficial hemostasis was achieved with manual compression. Post procedure scan was negative for significant hematoma. A dressing was placed. The patient tolerated the above procedures well without immediate postprocedural complication. IMPRESSION: 1. Technically successful  ultrasound guided biopsy of dominant right-sided parotid gland nodule/lymph node. 2. Technically successful ultrasound-guided biopsy of dominant left-sided parotid gland nodule/lymph node. Electronically Signed   By: Sandi Mariscal M.D.   On: 05/04/2019 12:05    Labs:  CBC: Recent Labs    03/23/19 1523 05/25/19 0808  WBC 6.2 8.1  HGB 12.9 15.0  HCT 39.8 45.9  PLT 208 149*    COAGS: Recent Labs    05/25/19 0808  INR 0.9    BMP: Recent Labs    03/23/19 1523  NA 138  K 4.6  CL 106  CO2 23  GLUCOSE 145*  BUN 30*  CALCIUM 8.9  CREATININE 1.01*  GFRNONAA >60  GFRAA >60    LIVER FUNCTION TESTS: Recent Labs    03/23/19 1523  BILITOT 0.4  AST 30  ALT 25  ALKPHOS 56  PROT 7.6  ALBUMIN 3.9    TUMOR MARKERS: No results for input(s): AFPTM, CEA, CA199, CHROMGRNA in the last 8760 hours.  Assessment and Plan:  Alison [name] is a 43 y.o. 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  Risks and benefits of CT guided BM Bx was discussed with the patient and/or patient's family including, but not limited to bleeding, infection, damage to adjacent structures or low yield requiring additional tests.  All of the questions were answered and there is agreement to proceed.  Consent signed and in chart.  Thank you for this interesting consult.  I greatly enjoyed meeting Alison [name] and look forward to participating in their care.  A copy of this report was sent to the requesting provider on this date.  Electronically Signed: Sandi Mariscal, MD 05/25/2019, 8:44 AM   I spent a total of 15 Minutes in face to face in clinical consultation, greater than 50% of which was counseling/coordinating care for CT guided BM Bx.

## 2019-05-28 ENCOUNTER — Other Ambulatory Visit: Payer: Self-pay | Admitting: *Deleted

## 2019-05-28 ENCOUNTER — Inpatient Hospital Stay: Payer: Medicaid Other

## 2019-05-28 ENCOUNTER — Encounter: Payer: Self-pay | Admitting: Oncology

## 2019-05-28 ENCOUNTER — Other Ambulatory Visit: Payer: Self-pay

## 2019-05-28 ENCOUNTER — Inpatient Hospital Stay (HOSPITAL_BASED_OUTPATIENT_CLINIC_OR_DEPARTMENT_OTHER): Payer: Medicaid Other | Admitting: Oncology

## 2019-05-28 VITALS — BP 130/78 | HR 93 | Resp 16

## 2019-05-28 VITALS — BP 144/96 | HR 69 | Temp 98.0°F | Resp 16 | Ht 66.5 in | Wt 250.0 lb

## 2019-05-28 DIAGNOSIS — C884 Extranodal marginal zone B-cell lymphoma of mucosa-associated lymphoid tissue [MALT-lymphoma]: Secondary | ICD-10-CM

## 2019-05-28 DIAGNOSIS — C8580 Other specified types of non-Hodgkin lymphoma, unspecified site: Secondary | ICD-10-CM

## 2019-05-28 DIAGNOSIS — Z79899 Other long term (current) drug therapy: Secondary | ICD-10-CM | POA: Diagnosis not present

## 2019-05-28 DIAGNOSIS — Z5112 Encounter for antineoplastic immunotherapy: Secondary | ICD-10-CM | POA: Diagnosis not present

## 2019-05-28 DIAGNOSIS — Z5181 Encounter for therapeutic drug level monitoring: Secondary | ICD-10-CM | POA: Diagnosis not present

## 2019-05-28 LAB — CBC WITH DIFFERENTIAL/PLATELET
Abs Immature Granulocytes: 0.02 10*3/uL (ref 0.00–0.07)
Basophils Absolute: 0 10*3/uL (ref 0.0–0.1)
Basophils Relative: 1 %
Eosinophils Absolute: 0.3 10*3/uL (ref 0.0–0.5)
Eosinophils Relative: 4 %
HCT: 41.3 % (ref 36.0–46.0)
Hemoglobin: 13.5 g/dL (ref 12.0–15.0)
Immature Granulocytes: 0 %
Lymphocytes Relative: 32 %
Lymphs Abs: 1.8 10*3/uL (ref 0.7–4.0)
MCH: 28.5 pg (ref 26.0–34.0)
MCHC: 32.7 g/dL (ref 30.0–36.0)
MCV: 87.3 fL (ref 80.0–100.0)
Monocytes Absolute: 0.6 10*3/uL (ref 0.1–1.0)
Monocytes Relative: 11 %
Neutro Abs: 2.9 10*3/uL (ref 1.7–7.7)
Neutrophils Relative %: 52 %
Platelets: 149 10*3/uL — ABNORMAL LOW (ref 150–400)
RBC: 4.73 MIL/uL (ref 3.87–5.11)
RDW: 13.3 % (ref 11.5–15.5)
WBC: 5.7 10*3/uL (ref 4.0–10.5)
nRBC: 0 % (ref 0.0–0.2)

## 2019-05-28 LAB — COMPREHENSIVE METABOLIC PANEL
ALT: 23 U/L (ref 0–44)
AST: 22 U/L (ref 15–41)
Albumin: 3.8 g/dL (ref 3.5–5.0)
Alkaline Phosphatase: 59 U/L (ref 38–126)
Anion gap: 9 (ref 5–15)
BUN: 32 mg/dL — ABNORMAL HIGH (ref 6–20)
CO2: 22 mmol/L (ref 22–32)
Calcium: 9 mg/dL (ref 8.9–10.3)
Chloride: 107 mmol/L (ref 98–111)
Creatinine, Ser: 1.06 mg/dL — ABNORMAL HIGH (ref 0.44–1.00)
GFR calc Af Amer: >60 mL/min (ref >60)
GFR calc non Af Amer: >60 mL/min (ref >60)
Glucose, Bld: 151 mg/dL — ABNORMAL HIGH (ref 70–99)
Potassium: 4.6 mmol/L (ref 3.5–5.1)
Sodium: 138 mmol/L (ref 135–145)
Total Bilirubin: 0.6 mg/dL (ref 0.3–1.2)
Total Protein: 7.5 g/dL (ref 6.5–8.1)

## 2019-05-28 LAB — LACTATE DEHYDROGENASE: LDH: 109 U/L (ref 98–192)

## 2019-05-28 MED ORDER — ACETAMINOPHEN 325 MG PO TABS
650.0000 mg | ORAL_TABLET | Freq: Once | ORAL | Status: AC
  Administered 2019-05-28: 09:00:00 650 mg via ORAL
  Filled 2019-05-28: qty 2

## 2019-05-28 MED ORDER — SODIUM CHLORIDE 0.9 % IV SOLN
Freq: Once | INTRAVENOUS | Status: AC
  Administered 2019-05-28: 09:00:00 via INTRAVENOUS
  Filled 2019-05-28: qty 250

## 2019-05-28 MED ORDER — DIPHENHYDRAMINE HCL 25 MG PO CAPS
50.0000 mg | ORAL_CAPSULE | Freq: Once | ORAL | Status: AC
  Administered 2019-05-28: 50 mg via ORAL
  Filled 2019-05-28: qty 2

## 2019-05-28 MED ORDER — SODIUM CHLORIDE 0.9 % IV SOLN
Freq: Once | INTRAVENOUS | Status: AC
  Administered 2019-05-28: 10:00:00 via INTRAVENOUS
  Filled 2019-05-28: qty 250

## 2019-05-28 MED ORDER — HEPARIN SOD (PORK) LOCK FLUSH 100 UNIT/ML IV SOLN
500.0000 [IU] | Freq: Once | INTRAVENOUS | Status: AC
  Administered 2019-05-28: 500 [IU] via INTRAVENOUS
  Filled 2019-05-28: qty 5

## 2019-05-28 MED ORDER — SODIUM CHLORIDE 0.9% FLUSH
10.0000 mL | Freq: Once | INTRAVENOUS | Status: AC
  Administered 2019-05-28: 10 mL via INTRAVENOUS
  Filled 2019-05-28: qty 10

## 2019-05-28 NOTE — Progress Notes (Signed)
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 She is currently on CellCept and tacrolimus for the same. She also has a history of post transplant lymphoproliferative disorder/DLBCL that was diagnosed in 2016. She is s/p 6 cycles of R-CHOP chemotherapy back then and was in complete remission 1. Her other past medical history significant for migraines, stage III chronic kidney disease, chemo-induced peripheral neuropathy, hypertension among other medical problems. With regards to her diffuse large B-cell lymphoma she was getting surveillance scans and this was all done in [place]. She has now moved to [facility] to be with her significant other.Patient was noted to have a prior kidney mass before which was biopsied and was not consistent with malignancy.  She underwent CT neck with contrast before she left [place] on 02/22/2019. She was noted to have soft tissue masses in both the parotid glands which were new as compared to prior exams and possibly represent lymph nodes. The largest one was seen in the left parotid gland measuring 1.3 x 1.1 cm. Before she could get a work-up for this patient moved to [place] and has not had any further work-up yet  Her other prior imaging is as follows: MRI thoracic spine without contrast in August 2019 showed moderate multilevel  spondylosis but no evidence of lymphoma. Multinodular thyroid in June 2019. MRI cervical spine May 2019 again showed spondylosis but no other acute pathology. MRI brain showed incidental partial opacification of the right and right sinus. No evidence of malignancy. I do not have any other PET CT scan or treatment records from the past.  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  Bone marrow biopsy has been done and is currently pending  Interval history-patient has chronic back pain which is unchanged.  Also reports mild discomfort in the region of her left parotid swelling.  ECOG PS- 1 Pain scale- 4 Opioid associated constipation- no  Review of systems- Review of Systems  Constitutional: Negative for chills, fever, malaise/fatigue and weight loss.  HENT: Negative for congestion, ear discharge and nosebleeds.   Eyes: Negative for blurred vision.  Respiratory: Negative for cough, hemoptysis, sputum production, shortness of breath and wheezing.   Cardiovascular: Negative for chest pain, palpitations, orthopnea and claudication.  Gastrointestinal: Negative for abdominal pain, blood in stool, constipation, diarrhea, heartburn, melena, nausea and vomiting.  Genitourinary: Negative for dysuria, flank pain, frequency, hematuria and urgency.  Musculoskeletal: Positive for back pain. Negative for joint pain and myalgias.  Skin: Negative for rash.  Neurological: Negative for dizziness, tingling, focal weakness, seizures, weakness and headaches.  Endo/Heme/Allergies: Does not bruise/bleed easily.  Psychiatric/Behavioral: Negative for depression and suicidal ideas. The patient does not have insomnia.       Allergies  Allergen Reactions   Carbamazepine Other (See Comments)    Medication interaction-prograf   Hydrocodone-Acetaminophen Itching   Naproxen Itching   Penicillin G Itching and Rash     Past Medical  History:  Diagnosis Date    Abnormal uterine bleeding    Bipolar disorder (manic depression) (HCC)    Cancer (HCC)    Chronic kidney failure    Chronic renal disease, stage III (HCC)    Diabetes mellitus without complication (HCC)    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HCC)    FH: trigeminal neuralgia    Hypertension    Kidney mass    Lupus (HCC)    Lupus (HCC)    Lymphoma (HCC)    Lymphoma (HCC)    Major depressive disorder    Migraine    Morbid obesity ([place])    Neuropathy    Post herpetic neuralgia    Renal disorder    S/P liver transplant [facility])      Past Surgical History:  Procedure Laterality Date   BONE MARROW BIOPSY  01/13/2015   BREAST BIOPSY  12/2014   BREAST BIOPSY  2011   BREAST SURGERY     CHOLECYSTECTOMY     HERNIA REPAIR     infusaport     LIVER TRANSPLANT  12/17/1991   LUMBAR PUNCTURE     PORT A CATH INJECTION ([place] HX)     tumor removal  2015    Social History   Socioeconomic History   Marital status: Single    Spouse name: Not on file   Number of children: Not on file   Years of education: 9   Highest education level: Not on file  Occupational History   Occupation: disabled  Social Designer, fashion/clothing strain: Hard   Food insecurity    Worry: Often true    Inability: Often true   Transportation needs    Medical: No    Non-medical: No  Tobacco Use   Smoking status: Never Smoker   Smokeless tobacco: Never Used  Substance and Sexual Activity   Alcohol use: Never    Frequency: Never   Drug use: Not Currently    Types: Marijuana   Sexual activity: Not Currently  Lifestyle   Physical activity    Days per week: 7 days    Minutes per session: 20 min   Stress: To some extent  Relationships   Social connections    Talks on phone: Once a week    Gets together: Three times a week    Attends religious service: More than 4 times per year    Active member of club or organization: No    Attends meetings of clubs  or organizations: Never    Relationship status: Not on file   Intimate partner violence    Fear of current or ex partner: No    Emotionally abused: No    Physically abused: No    Forced sexual activity: No  Other Topics Concern   Not on file  Social History Narrative   Not on file    Family History  Problem Relation Age of Onset   Heart disease Mother    Hypertension Mother    Cancer - Other Mother    Heart disease Father    Hypertension Father    Diabetes Father    Parkinson's disease Maternal Grandmother    Cancer Maternal Aunt    Cancer Maternal Uncle    Cancer Maternal Grandfather    Lupus Paternal Grandmother    Hypertension Brother      Current Outpatient Medications:    baclofen (LIORESAL) 20 MG tablet, Take 1 tablet (20 mg total) by mouth 3 (three) times daily.,  Disp: 30 each, Rfl: 2   Cranberry 125 MG TABS, Take 1 tablet by mouth 1 day or 1 dose., Disp: , Rfl:    diphenhydrAMINE (BENADRYL) 50 MG capsule, Take 50 mg by mouth every 6 (six) hours as needed., Disp: , Rfl:    HYDROmorphone (DILAUDID) 4 MG tablet, Take 1 tablet by mouth every 6 (six) hours as needed., Disp: , Rfl:    lisinopril (ZESTRIL) 20 MG tablet, Take 1 tablet (20 mg total) by mouth 1 day or 1 dose., Disp: 90 tablet, Rfl: 1   LORazepam (ATIVAN) 1 MG tablet, Take 1 tablet by mouth at bedtime as needed., Disp: , Rfl:    metFORMIN (GLUCOPHAGE) 1000 MG tablet, Take 1 tablet by mouth 2 (two) times a day., Disp: , Rfl:    mycophenolate (CELLCEPT) 250 MG capsule, Take 1 capsule by mouth 2 (two) times a day., Disp: , Rfl:    oxycodone (ROXICODONE) 30 MG immediate release tablet, Take 1 tablet by mouth every 4 (four) hours as needed., Disp: , Rfl:    pregabalin (LYRICA) 200 MG capsule, Take 1 capsule (200 mg total) by mouth 3 (three) times daily for 30 days., Disp: 90 capsule, Rfl: 1   promethazine (PHENERGAN) 25 MG tablet, Take 1 tablet by mouth as needed., Disp: , Rfl:     tacrolimus (PROGRAF) 1 MG capsule, Take 1 capsule by mouth 1 day or 1 dose., Disp: , Rfl:    tiZANidine (ZANAFLEX) 4 MG tablet, Take 1 tablet (4 mg total) by mouth 2 (two) times daily as needed for muscle spasms., Disp: 30 tablet, Rfl: 1 No current facility-administered medications for this visit.   Facility-Administered Medications Ordered in Other Visits:    heparin lock flush 100 unit/mL, 500 Units, Intravenous, Once, Sindy Guadeloupe, MD  Physical exam:  Vitals:   05/28/19 0842  Pulse: 69  Temp: 98 F (36.7 C)  TempSrc: Tympanic  Weight: 250 lb (113.4 kg)  Height: 5' 6.5" (1.689 m)   Physical Exam HENT:     Head: Normocephalic and atraumatic.  Eyes:     Pupils: Pupils are equal, round, and reactive to light.  Neck:     Musculoskeletal: Normal range of motion.  Cardiovascular:     Rate and Rhythm: Normal rate and regular rhythm.     Heart sounds: Normal heart sounds.  Pulmonary:     Effort: Pulmonary effort is normal.     Breath sounds: Normal breath sounds.  Abdominal:     General: Bowel sounds are normal.     Palpations: Abdomen is soft.  Lymphadenopathy:     Comments: Pea-sized mass palpable below and front of the left ear  Skin:    General: Skin is warm and dry.  Neurological:     Mental Status: She is alert and oriented to person, place, and time.      CMP Latest Ref Rng & Units 03/23/2019  Glucose 70 - 99 mg/dL 145(H)  BUN 6 - 20 mg/dL 30(H)  Creatinine 0.44 - 1.00 mg/dL 1.01(H)  Sodium 135 - 145 mmol/L 138  Potassium 3.5 - 5.1 mmol/L 4.6  Chloride 98 - 111 mmol/L 106  CO2 22 - 32 mmol/L 23  Calcium 8.9 - 10.3 mg/dL 8.9  Total Protein 6.5 - 8.1 g/dL 7.6  Total Bilirubin 0.3 - 1.2 mg/dL 0.4  Alkaline Phos 38 - 126 U/L 56  AST 15 - 41 U/L 30  ALT 0 - 44 U/L 25   CBC Latest Ref Rng &  Units 05/28/2019  WBC 4.0 - 10.5 K/uL 5.7  Hemoglobin 12.0 - 15.0 g/dL 13.5  Hematocrit 36.0 - 46.0 % 41.3  Platelets 150 - 400 K/uL 149(L)    No images are attached to  the encounter.  US Soft Tissue Head & Neck (non-thyroid)  Result Date: 05/04/2019 CLINICAL DATA:  History of lymphoma now with hypermetabolic bilateral parotid nodules. EXAM: ULTRASOUND OF HEAD/NECK SOFT TISSUES TECHNIQUE: Ultrasound examination of the head and neck soft tissues was performed in the area of clinical concern. COMPARISON:  PET-CT - 04/07/2019 FINDINGS: Sonographic evaluation of the left parotid gland demonstrates a well-defined approximately 1.4 x 1.3 x 0.8 cm hypoechoic suspected lymph node likely within the peripheral aspect the left parotid gland. Sonographic evaluation of the right parotid gland demonstrates a similar appearing approximately 1.2 x 1.1 x 0.7 cm suspected lymph node also likely within the peripheral aspect of the right parotid gland. IMPRESSION: Sonographic evaluation demonstrates prominent though non pathologically enlarged bilateral parotid gland lymph nodes. Patient subsequent underwent ultrasound-guided biopsy of both of the parotid gland nodules/lymph nodes. Electronically Signed   By: Sandi Mariscal M.D.   On: 05/04/2019 11:58   Ct Bone Marrow Biopsy & Aspiration  Result Date: 05/25/2019 INDICATION: History of lymphoma. Please perform CT-guided bone marrow biopsy for tissue diagnostic purposes. EXAM: CT-GUIDED BONE MARROW BIOPSY AND ASPIRATION MEDICATIONS: None ANESTHESIA/SEDATION: Fentanyl 100 mcg IV; Versed 4 mg IV Sedation Time: 12 minutes; The patient was continuously monitored during the procedure by the interventional radiology nurse under my direct supervision. COMPLICATIONS: None immediate. PROCEDURE: Informed consent was obtained from the patient following an explanation of the procedure, risks, benefits and alternatives. The patient understands, agrees and consents for the procedure. All questions were addressed. A time out was performed prior to the initiation of the procedure. The patient was positioned prone and non-contrast localization CT was performed of  the pelvis to demonstrate the iliac marrow spaces. The operative site was prepped and draped in the usual sterile fashion. Under sterile conditions and local anesthesia, a 22 gauge spinal needle was utilized for procedural planning. Next, an 11 gauge coaxial bone biopsy needle was advanced into the left iliac marrow space. Needle position was confirmed with CT imaging. Initially, bone marrow aspiration was performed. Next, a bone marrow biopsy was obtained with the 11 gauge outer bone marrow device. The 11 gauge coaxial bone biopsy needle was re-advanced into a slightly different location within the left iliac marrow space, positioning was confirmed and an additional bone marrow biopsy was obtained. The needle was removed intact. Hemostasis was obtained with compression and a dressing was placed. The patient tolerated the procedure well without immediate post procedural complication. IMPRESSION: Successful CT guided left iliac bone marrow aspiration and core biopsy. Electronically Signed   By: Sandi Mariscal M.D.   On: 05/25/2019 10:23   [place] Core Biopsy (salivary Gland/parotid Gland)  Result Date: 05/04/2019 INDICATION: History of lymphoma, now with indeterminate bilateral hypermetabolic parotid gland nodules/lymph nodes. Please perform ultrasound-guided biopsy for tissue diagnostic purposes. EXAM: 1. ULTRASOUND-GUIDED LEFT PAROTID GLAND NODULE/LYMPH NODE BIOPSY 2. ULTRASOUND-GUIDED RIGHT PAROTID GLAND NODULE/LYMPH NODE BIOPSY COMPARISON:  PET-CT-04/07/2019 MEDICATIONS: None ANESTHESIA/SEDATION: Moderate (conscious) sedation was employed during this procedure. A total of Versed 3 mg and Fentanyl 100 mcg was administered intravenously. Moderate Sedation Time: 25 minutes. The patient's level of consciousness and vital signs were monitored continuously by radiology nursing throughout the procedure under my direct supervision. COMPLICATIONS: None immediate. TECHNIQUE: Informed written consent was obtained from the  patient after a discussion of the risks, benefits and alternatives to treatment. Questions regarding the procedure were encouraged and answered. Initial ultrasound scanning demonstrated unchanged appearance of bilateral parotid gland nodules/lymph nodes with dominant right-sided parotid gland nodule measuring approximately 1.2 cm (image 1) and dominant left-sided parotid gland nodule measuring approximately 1.4 cm (image 4). Ultrasound images were saved for procedural documentation purposes. The procedure was planned. A timeout was performed prior to the initiation of the procedure. Beginning with the right-sided parotid gland nodule/lymph node, the operative was prepped and draped in the usual sterile fashion, and a sterile drape was applied covering the operative field. A timeout was performed prior to the initiation of the procedure. Local anesthesia was provided with 1% lidocaine with epinephrine. Under direct ultrasound guidance, an 18 gauge core needle device was utilized to obtain to obtain 5 core needle biopsies of the dominant right-sided parotid gland nodule/lymph. Multiple ultrasound images were saved for procedural documentation purposes. Attention was now paid towards the dominant left-sided parotid gland nodule/lymph node. The operative was prepped and draped in the usual sterile fashion, and a sterile drape was applied covering the operative field. A timeout was performed prior to the initiation of the procedure. Local anesthesia was provided with 1% lidocaine with epinephrine. Under direct ultrasound guidance, an 18 gauge core needle device was utilized to obtain to obtain 3 core needle biopsies of the dominant left-sided parotid gland nodule/lymph. Multiple ultrasound images were saved for procedural documentation purposes. The samples were placed in saline and submitted to pathology. The needle was removed and superficial hemostasis was achieved with manual compression. Post procedure scan was  negative for significant hematoma. A dressing was placed. The patient tolerated the above procedures well without immediate postprocedural complication. IMPRESSION: 1. Technically successful ultrasound guided biopsy of dominant right-sided parotid gland nodule/lymph node. 2. Technically successful ultrasound-guided biopsy of dominant left-sided parotid gland nodule/lymph node. Electronically Signed   By: Sandi Mariscal M.D.   On: 05/04/2019 12:05   [place] Core Biopsy (salivary Gland/parotid Gland)  Result Date: 05/04/2019 INDICATION: History of lymphoma, now with indeterminate bilateral hypermetabolic parotid gland nodules/lymph nodes. Please perform ultrasound-guided biopsy for tissue diagnostic purposes. EXAM: 1. ULTRASOUND-GUIDED LEFT PAROTID GLAND NODULE/LYMPH NODE BIOPSY 2. ULTRASOUND-GUIDED RIGHT PAROTID GLAND NODULE/LYMPH NODE BIOPSY COMPARISON:  PET-CT-04/07/2019 MEDICATIONS: None ANESTHESIA/SEDATION: Moderate (conscious) sedation was employed during this procedure. A total of Versed 3 mg and Fentanyl 100 mcg was administered intravenously. Moderate Sedation Time: 25 minutes. The patient's level of consciousness and vital signs were monitored continuously by radiology nursing throughout the procedure under my direct supervision. COMPLICATIONS: None immediate. TECHNIQUE: Informed written consent was obtained from the patient after a discussion of the risks, benefits and alternatives to treatment. Questions regarding the procedure were encouraged and answered. Initial ultrasound scanning demonstrated unchanged appearance of bilateral parotid gland nodules/lymph nodes with dominant right-sided parotid gland nodule measuring approximately 1.2 cm (image 1) and dominant left-sided parotid gland nodule measuring approximately 1.4 cm (image 4). Ultrasound images were saved for procedural documentation purposes. The procedure was planned. A timeout was performed prior to the initiation of the procedure. Beginning with  the right-sided parotid gland nodule/lymph node, the operative was prepped and draped in the usual sterile fashion, and a sterile drape was applied covering the operative field. A timeout was performed prior to the initiation of the procedure. Local anesthesia was provided with 1% lidocaine with epinephrine. Under direct ultrasound guidance, an 18 gauge core needle device was utilized to obtain to obtain 5 core  needle biopsies of the dominant right-sided parotid gland nodule/lymph. Multiple ultrasound images were saved for procedural documentation purposes. Attention was now paid towards the dominant left-sided parotid gland nodule/lymph node. The operative was prepped and draped in the usual sterile fashion, and a sterile drape was applied covering the operative field. A timeout was performed prior to the initiation of the procedure. Local anesthesia was provided with 1% lidocaine with epinephrine. Under direct ultrasound guidance, an 18 gauge core needle device was utilized to obtain to obtain 3 core needle biopsies of the dominant left-sided parotid gland nodule/lymph. Multiple ultrasound images were saved for procedural documentation purposes. The samples were placed in saline and submitted to pathology. The needle was removed and superficial hemostasis was achieved with manual compression. Post procedure scan was negative for significant hematoma. A dressing was placed. The patient tolerated the above procedures well without immediate postprocedural complication. IMPRESSION: 1. Technically successful ultrasound guided biopsy of dominant right-sided parotid gland nodule/lymph node. 2. Technically successful ultrasound-guided biopsy of dominant left-sided parotid gland nodule/lymph node. Electronically Signed   By: Sandi Mariscal M.D.   On: 05/04/2019 12:05     Assessment and plan- Patient is a 43 y.o. 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  Bone marrow biopsy  is currently pending for complete staging work-up  Counts okay to proceed with cycle 1 of weekly Rituxan today.  She will directly proceed for Rituxan next week with CBC with differential and CMP and I will see her back in 2 weeks for cycle 3.  Patient has received Rituxan in the past without any significant side effects.  Again discussed risks and benefits of Rituxan including all but not limited to nausea, vomiting, low blood counts specifically delayed neutropenia and infusion reaction.  Patient understands and agrees to proceed as planned.  Treatment is being given with a curative intent.    Final bone marrow results are pending at this time.  Bone marrow flow cytometry did not show any evidence of lymphoma.   Visit Diagnosis 1. Visit for monitoring Rituxan therapy   2. 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 Randa Evens, MD, MPH [facility] at [facility] [phone] 05/28/2019 9:14 AM

## 2019-05-28 NOTE — Progress Notes (Signed)
cbc

## 2019-05-31 ENCOUNTER — Encounter: Payer: Self-pay | Admitting: Oncology

## 2019-05-31 NOTE — Progress Notes (Signed)
This encounter was created in error - please disregard.

## 2019-06-01 ENCOUNTER — Encounter: Payer: Self-pay | Admitting: Obstetrics and Gynecology

## 2019-06-01 ENCOUNTER — Other Ambulatory Visit: Payer: Self-pay

## 2019-06-01 ENCOUNTER — Ambulatory Visit (INDEPENDENT_AMBULATORY_CARE_PROVIDER_SITE_OTHER): Payer: Medicaid Other

## 2019-06-01 ENCOUNTER — Encounter (HOSPITAL_COMMUNITY): Payer: Self-pay | Admitting: Oncology

## 2019-06-01 ENCOUNTER — Ambulatory Visit (INDEPENDENT_AMBULATORY_CARE_PROVIDER_SITE_OTHER): Payer: Medicaid Other | Admitting: Obstetrics and Gynecology

## 2019-06-01 VITALS — BP 110/84 | Wt 246.0 lb

## 2019-06-01 DIAGNOSIS — N939 Abnormal uterine and vaginal bleeding, unspecified: Secondary | ICD-10-CM | POA: Diagnosis not present

## 2019-06-01 DIAGNOSIS — N950 Postmenopausal bleeding: Secondary | ICD-10-CM | POA: Diagnosis not present

## 2019-06-01 LAB — TSH+PRL+FSH+TESTT+LH+DHEA S...
17-Hydroxyprogesterone: 14 ng/dL
Androstenedione: 33 ng/dL — ABNORMAL LOW (ref 41–262)
DHEA-SO4: 47.1 ug/dL (ref 41.2–243.7)
FSH: 73.3 m[IU]/mL
LH: 45.9 m[IU]/mL
Prolactin: 21.3 ng/mL (ref 4.8–23.3)
TSH: 4.65 u[IU]/mL — ABNORMAL HIGH (ref 0.450–4.500)
Testosterone, Free: 0.2 pg/mL (ref 0.0–4.2)
Testosterone: <3 ng/dL — ABNORMAL LOW (ref 8–48)

## 2019-06-01 LAB — ESTRADIOL: Estradiol: 14.2 pg/mL

## 2019-06-01 NOTE — Progress Notes (Signed)
° ° °Gynecology Ultrasound Follow Up  °Chief Complaint:  °Chief Complaint  °Patient presents with  °• Follow-up  °  GYN ultrasound  ° ° ° °History of Present Illness: Patient is a 43 y.o. female who presents today for ultrasound evaluation of postmenopausal bleeding.  Postmenopausal status is secondary to prior chemotherapy, confirmed by FSH/estradiol on last visit.  No further bleeding. ° °Ultrasound demonstrates the following findgins °Adnexa: normal right adnexa, non-visualized left adnexa.  However I believe the left adnexa was visualized but was assumed to be a fibroid by the sonographer °Uterus: Non-enlarged with endometrial stripe of 3.8mm, no focal endometrial abnormaliteis °Additional: no free fluid ° °Review of Systems: Review of Systems  °Constitutional: Negative.   °Gastrointestinal: Negative for abdominal pain.  °Genitourinary: Negative.   ° ° °Past Medical History:  °Past Medical History:  °Diagnosis Date  °• Abnormal uterine bleeding   °• Allergy   °• Anxiety   °• Arthritis   °• Bipolar disorder (manic depression) (HCC)   °• Chronic kidney failure   °• Chronic renal disease, stage III (HCC)   °• Diabetes mellitus without complication (HCC)   °• 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HCC)   °• FH: trigeminal neuralgia   °• GERD (gastroesophageal reflux disease)   °• Heart murmur   °• Hypertension   °• Kidney mass   °• Lupus (HCC)   °• Lupus (HCC)   °• lymphoma   °• Lymphoma (HCC)   °• Lymphoma (HCC)   °• Major depressive disorder   °• Migraine   °• Morbid obesity (HCC)   °• Neuromuscular disorder (HCC)   ° neuropathy  °• Neuropathy   °• Post herpetic neuralgia   °• Renal disorder   °• S/P liver transplant (HCC)   ° ° °Past Surgical History:  °Past Surgical History:  °Procedure Laterality Date  °• BONE MARROW BIOPSY  01/13/2015  °• BREAST BIOPSY  12/2014  °• BREAST BIOPSY  2011  °• BREAST SURGERY    °• CHOLECYSTECTOMY    °• HERNIA REPAIR    °• infusaport    °• LIVER TRANSPLANT  12/17/1991  °• LUMBAR  PUNCTURE    °• PORT A CATH INJECTION ([facility] HX)    °• tumor removal  2015  ° ° °Gynecologic History:  °Patient's last menstrual period was 02/04/2018 (approximate). °Contraception: post menopausal status ° °Family History:  °Family History  °Problem Relation Age of Onset  °• Heart disease Mother   °• Hypertension Mother   °• Cancer - Other Mother   °• Heart disease Father   °• Hypertension Father   °• Diabetes Father   °• Parkinson's disease Maternal Grandmother   °• Cancer Maternal Aunt   °• Cancer Maternal Uncle   °• Cancer Maternal Grandfather   °• Lupus Paternal Grandmother   °• Hypertension Brother   ° ° °Social History:  °Social History  ° °Socioeconomic History  °• Marital status: Single  °  Spouse name: Not on file  °• Number of children: Not on file  °• Years of education: 9  °• Highest education level: Not on file  °Occupational History  °• Occupation: disabled  °Social Needs  °• Financial resource strain: Hard  °• Food insecurity  °  Worry: Often true  °  Inability: Often true  °• Transportation needs  °  Medical: No  °  Non-medical: No  °Tobacco Use  °• Smoking status: Never Smoker  °• Smokeless tobacco: Never Used  °Substance and Sexual Activity  °•   Alcohol use: Never    Frequency: Never   Drug use: Not Currently    Types: Marijuana   Sexual activity: Not Currently  Lifestyle   Physical activity    Days per week: 7 days    Minutes per session: 20 min   Stress: To some extent  Relationships   Social connections    Talks on phone: Once a week    Gets together: Three times a week    Attends religious service: More than 4 times per year    Active member of club or organization: No    Attends meetings of clubs or organizations: Never    Relationship status: Not on file   Intimate partner violence    Fear of current or ex partner: No    Emotionally abused: No    Physically abused: No    Forced sexual activity: No  Other Topics Concern   Not on file  Social History Narrative    Not on file    Allergies:  Allergies  Allergen Reactions   Carbamazepine Other (See Comments)    Medication interaction-prograf   Hydrocodone-Acetaminophen Itching   Naproxen Itching   Penicillin G Itching and Rash    Medications: Prior to Admission medications   Medication Sig Start Date End Date Taking? Authorizing Provider  baclofen (LIORESAL) 20 MG tablet Take 1 tablet (20 mg total) by mouth 3 (three) times daily. 04/05/19   [name], FNP  Cranberry 125 MG TABS Take 1 tablet by mouth 1 day or 1 dose.    [provider]  diphenhydrAMINE (BENADRYL) 50 MG capsule Take 50 mg by mouth every 6 (six) hours as needed.    [provider]  HYDROmorphone (DILAUDID) 4 MG tablet Take 1 tablet by mouth every 6 (six) hours as needed.    [provider]  lisinopril (ZESTRIL) 20 MG tablet Take 1 tablet (20 mg total) by mouth 1 day or 1 dose. 04/05/19   [name], FNP  LORazepam (ATIVAN) 1 MG tablet Take 1 tablet by mouth at bedtime as needed.    [provider]  metFORMIN (GLUCOPHAGE) 1000 MG tablet Take 1 tablet by mouth 2 (two) times a day.    [provider]  mycophenolate (CELLCEPT) 250 MG capsule Take 1 capsule by mouth 2 (two) times a day.    [provider]  oxycodone (ROXICODONE) 30 MG immediate release tablet Take 1 tablet by mouth every 4 (four) hours as needed.    [provider]  pregabalin (LYRICA) 200 MG capsule Take 1 capsule (200 mg total) by mouth 3 (three) times daily for 30 days. 04/16/19 05/28/19  [name], FNP  promethazine (PHENERGAN) 25 MG tablet Take 1 tablet by mouth as needed.    [provider]  tacrolimus (PROGRAF) 1 MG capsule Take 1 capsule by mouth 1 day or 1 dose.    [provider]  tiZANidine (ZANAFLEX) 4 MG tablet Take 1 tablet (4 mg total) by mouth 2 (two) times daily as needed for muscle spasms. 05/18/19 05/17/20  [name], MD    Physical Exam Vitals: Blood  pressure 110/84, weight 246 lb (111.6 kg), last menstrual period 02/04/2018.  General: NAD HEENT: normocephalic, anicteric Pulmonary: No increased work of breathing Extremities: no edema, erythema, or tenderness Neurologic: Grossly intact, normal gait Psychiatric: mood appropriate, affect full  US Soft Tissue Head & Neck (non-thyroid)  Result Date: 05/04/2019 CLINICAL DATA:  History of lymphoma now with hypermetabolic bilateral parotid  nodules. EXAM: ULTRASOUND OF HEAD/NECK SOFT TISSUES TECHNIQUE: Ultrasound examination of the head and neck soft tissues was performed in the area of clinical concern. COMPARISON:  PET-CT - 04/07/2019 FINDINGS: Sonographic evaluation of the left parotid gland demonstrates a well-defined approximately 1.4 x 1.3 x 0.8 cm hypoechoic suspected lymph node likely within the peripheral aspect the left parotid gland. Sonographic evaluation of the right parotid gland demonstrates a similar appearing approximately 1.2 x 1.1 x 0.7 cm suspected lymph node also likely within the peripheral aspect of the right parotid gland. IMPRESSION: Sonographic evaluation demonstrates prominent though non pathologically enlarged bilateral parotid gland lymph nodes. Patient subsequent underwent ultrasound-guided biopsy of both of the parotid gland nodules/lymph nodes. Electronically Signed   By: Sandi Mariscal M.D.   On: 05/04/2019 11:58   US Transvaginal Non-ob  Result Date: 06/01/2019 Patient Name: Alison [name] DOB: 10-Oct-1975 MRN: [id number] ULTRASOUND REPORT Location: [facility] Date of Service: 06/01/2019 Indications:Abnormal Uterine Bleeding Findings: The uterus is axial and measures 6.0 x 3.1 x 2.5 cm. Echo texture is homogenous with evidence of a focal mass. Within the uterus is a suspected fibroid measuring: Fibroid 1:16.7 x 15.4 x 16.1 mm pedunculated left. The Endometrium measures 3.8 mm. Right Ovary measures 2.2 x 0.9 x 1.2 cm. It is normal in appearance. Left Ovary is not visible.  Survey of the adnexa demonstrates no adnexal masses. There is no free fluid in the cul de sac. Impression: 1. Thin endometrium. 2. There is one pedunculated fibroid. 3. Normal right ovary. 4. The left ovary is not visible. Recommendations: 1.Clinical correlation with the patient's History and Physical Exam. Gweneth Dimitri, RT Images reviewed.  Normal GYN study with the exception of a pedunculated 16.7 x 15.4 x 16.50m pedunculated left fibroid.  This could also represent the left ovary which was not visualized.  In the setting of postmenopausal bleeding and endometrial stripe under 478meffectively rules out endometrial hyperplasia or carcinoma.  AnMalachy MoodMD, FALoura PardonB/GYN, CoAirport Road Additionroup 06/01/2019, 3:18 PM   Ct Bone Marrow Biopsy & Aspiration  Result Date: 05/25/2019 INDICATION: History of lymphoma. Please perform CT-guided bone marrow biopsy for tissue diagnostic purposes. EXAM: CT-GUIDED BONE MARROW BIOPSY AND ASPIRATION MEDICATIONS: None ANESTHESIA/SEDATION: Fentanyl 100 mcg IV; Versed 4 mg IV Sedation Time: 12 minutes; The patient was continuously monitored during the procedure by the interventional radiology nurse under my direct supervision. COMPLICATIONS: None immediate. PROCEDURE: Informed consent was obtained from the patient following an explanation of the procedure, risks, benefits and alternatives. The patient understands, agrees and consents for the procedure. All questions were addressed. A time out was performed prior to the initiation of the procedure. The patient was positioned prone and non-contrast localization CT was performed of the pelvis to demonstrate the iliac marrow spaces. The operative site was prepped and draped in the usual sterile fashion. Under sterile conditions and local anesthesia, a 22 gauge spinal needle was utilized for procedural planning. Next, an 11 gauge coaxial bone biopsy needle was advanced into the left iliac marrow space. Needle  position was confirmed with CT imaging. Initially, bone marrow aspiration was performed. Next, a bone marrow biopsy was obtained with the 11 gauge outer bone marrow device. The 11 gauge coaxial bone biopsy needle was re-advanced into a slightly different location within the left iliac marrow space, positioning was confirmed and an additional bone marrow biopsy was obtained. The needle was removed intact. Hemostasis was obtained with compression and a dressing was placed. The patient tolerated the  procedure well without immediate post procedural complication. IMPRESSION: Successful CT guided left iliac bone marrow aspiration and core biopsy. Electronically Signed   By: John  Watts M.D.   On: 05/25/2019 10:23  ° °Us Core Biopsy (salivary Gland/parotid Gland) ° °Result Date: 05/04/2019 °INDICATION: History of lymphoma, now with indeterminate bilateral hypermetabolic parotid gland nodules/lymph nodes. Please perform ultrasound-guided biopsy for tissue diagnostic purposes. EXAM: 1. ULTRASOUND-GUIDED LEFT PAROTID GLAND NODULE/LYMPH NODE BIOPSY 2. ULTRASOUND-GUIDED RIGHT PAROTID GLAND NODULE/LYMPH NODE BIOPSY COMPARISON:  PET-CT-04/07/2019 MEDICATIONS: None ANESTHESIA/SEDATION: Moderate (conscious) sedation was employed during this procedure. A total of Versed 3 mg and Fentanyl 100 mcg was administered intravenously. Moderate Sedation Time: 25 minutes. The patient's level of consciousness and vital signs were monitored continuously by radiology nursing throughout the procedure under my direct supervision. COMPLICATIONS: None immediate. TECHNIQUE: Informed written consent was obtained from the patient after a discussion of the risks, benefits and alternatives to treatment. Questions regarding the procedure were encouraged and answered. Initial ultrasound scanning demonstrated unchanged appearance of bilateral parotid gland nodules/lymph nodes with dominant right-sided parotid gland nodule measuring approximately 1.2 cm  (image 1) and dominant left-sided parotid gland nodule measuring approximately 1.4 cm (image 4). Ultrasound images were saved for procedural documentation purposes. The procedure was planned. A timeout was performed prior to the initiation of the procedure. Beginning with the right-sided parotid gland nodule/lymph node, the operative was prepped and draped in the usual sterile fashion, and a sterile drape was applied covering the operative field. A timeout was performed prior to the initiation of the procedure. Local anesthesia was provided with 1% lidocaine with epinephrine. Under direct ultrasound guidance, an 18 gauge core needle device was utilized to obtain to obtain 5 core needle biopsies of the dominant right-sided parotid gland nodule/lymph. Multiple ultrasound images were saved for procedural documentation purposes. Attention was now paid towards the dominant left-sided parotid gland nodule/lymph node. The operative was prepped and draped in the usual sterile fashion, and a sterile drape was applied covering the operative field. A timeout was performed prior to the initiation of the procedure. Local anesthesia was provided with 1% lidocaine with epinephrine. Under direct ultrasound guidance, an 18 gauge core needle device was utilized to obtain to obtain 3 core needle biopsies of the dominant left-sided parotid gland nodule/lymph. Multiple ultrasound images were saved for procedural documentation purposes. The samples were placed in saline and submitted to pathology. The needle was removed and superficial hemostasis was achieved with manual compression. Post procedure scan was negative for significant hematoma. A dressing was placed. The patient tolerated the above procedures well without immediate postprocedural complication. IMPRESSION: 1. Technically successful ultrasound guided biopsy of dominant right-sided parotid gland nodule/lymph node. 2. Technically successful ultrasound-guided biopsy of dominant  left-sided parotid gland nodule/lymph node. Electronically Signed   By: John  Watts M.D.   On: 05/04/2019 12:05  ° °Us Core Biopsy (salivary Gland/parotid Gland) ° °Result Date: 05/04/2019 °INDICATION: History of lymphoma, now with indeterminate bilateral hypermetabolic parotid gland nodules/lymph nodes. Please perform ultrasound-guided biopsy for tissue diagnostic purposes. EXAM: 1. ULTRASOUND-GUIDED LEFT PAROTID GLAND NODULE/LYMPH NODE BIOPSY 2. ULTRASOUND-GUIDED RIGHT PAROTID GLAND NODULE/LYMPH NODE BIOPSY COMPARISON:  PET-CT-04/07/2019 MEDICATIONS: None ANESTHESIA/SEDATION: Moderate (conscious) sedation was employed during this procedure. A total of Versed 3 mg and Fentanyl 100 mcg was administered intravenously. Moderate Sedation Time: 25 minutes. The patient's level of consciousness and vital signs were monitored continuously by radiology nursing throughout the procedure under my direct supervision. COMPLICATIONS: None immediate. TECHNIQUE: Informed written consent was obtained from   the patient after a discussion of the risks, benefits and alternatives to treatment. Questions regarding the procedure were encouraged and answered. Initial ultrasound scanning demonstrated unchanged appearance of bilateral parotid gland nodules/lymph nodes with dominant right-sided parotid gland nodule measuring approximately 1.2 cm (image 1) and dominant left-sided parotid gland nodule measuring approximately 1.4 cm (image 4). Ultrasound images were saved for procedural documentation purposes. The procedure was planned. A timeout was performed prior to the initiation of the procedure. Beginning with the right-sided parotid gland nodule/lymph node, the operative was prepped and draped in the usual sterile fashion, and a sterile drape was applied covering the operative field. A timeout was performed prior to the initiation of the procedure. Local anesthesia was provided with 1% lidocaine with epinephrine. Under direct ultrasound  guidance, an 18 gauge core needle device was utilized to obtain to obtain 5 core needle biopsies of the dominant right-sided parotid gland nodule/lymph. Multiple ultrasound images were saved for procedural documentation purposes. Attention was now paid towards the dominant left-sided parotid gland nodule/lymph node. The operative was prepped and draped in the usual sterile fashion, and a sterile drape was applied covering the operative field. A timeout was performed prior to the initiation of the procedure. Local anesthesia was provided with 1% lidocaine with epinephrine. Under direct ultrasound guidance, an 18 gauge core needle device was utilized to obtain to obtain 3 core needle biopsies of the dominant left-sided parotid gland nodule/lymph. Multiple ultrasound images were saved for procedural documentation purposes. The samples were placed in saline and submitted to pathology. The needle was removed and superficial hemostasis was achieved with manual compression. Post procedure scan was negative for significant hematoma. A dressing was placed. The patient tolerated the above procedures well without immediate postprocedural complication. IMPRESSION: 1. Technically successful ultrasound guided biopsy of dominant right-sided parotid gland nodule/lymph node. 2. Technically successful ultrasound-guided biopsy of dominant left-sided parotid gland nodule/lymph node. Electronically Signed   By: John  Watts M.D.   On: 05/04/2019 12:05  ° °Recent Results (from the past 2160 hour(s))  °Hepatitis B surface antibody     Status: Abnormal  ° Collection Time: 03/23/19  3:23 PM  °Result Value Ref Range  ° Hepatitis B-Post <3.1 (L) Immunity>9.9 mIU/mL  °  Comment: (NOTE) ° Status of Immunity                     Anti-HBs Level ° ------------------                     -------------- °Inconsistent with Immunity                   0.0 - 9.9 °Consistent with Immunity                          >9.9 °Performed At: [facility]  [place] °[address] [place], [place] [id number] °Nagendra Sanjai MD Ph:[phone] °  °Hepatitis B core antibody, total     Status: None  ° Collection Time: 03/23/19  3:23 PM  °Result Value Ref Range  ° Hep B Core Total Ab Negative Negative  °  Comment: (NOTE) °Performed At: [facility] [place] °[address] [place], [place] [id number] °Nagendra Sanjai MD Ph:[phone] °  °Hepatitis C antibody     Status: None  ° Collection Time: 03/23/19  3:23 PM  °Result Value Ref Range  ° HCV Ab <0.1 0.0 - 0.9 s/co ratio  °  Comment: (NOTE) °                                   Negative:     < 0.8 °                            Indeterminate: 0.8 - 0.9 °                                 Positive:     > 0.9 °The CDC recommends that a positive HCV antibody result °be followed up with a HCV Nucleic Acid Amplification °test (550713). °Performed At: [facility] [place] °[address] [place], [place] [id number] °Nagendra Sanjai MD Ph:[phone] °  °Hepatitis B surface antigen     Status: None  ° Collection Time: 03/23/19  3:23 PM  °Result Value Ref Range  ° Hepatitis B Surface Ag Negative Negative  °  Comment: (NOTE) °Performed At: [facility] [place] °[address] [place], [place] [id number] °Nagendra Sanjai MD Ph:[phone] °  °Lactate dehydrogenase     Status: None  ° Collection Time: 03/23/19  3:23 PM  °Result Value Ref Range  ° LDH 123 98 - 192 U/L  °  Comment: Performed at [facility], [address] [place], [place] [address]  °Uric acid     Status: Abnormal  ° Collection Time: 03/23/19  3:23 PM  °Result Value Ref Range  ° Uric Acid, Serum 9.7 (H) 2.5 - 7.1 mg/dL  °  Comment: Performed at [facility], [address] [place], [place] [address]  °Comprehensive metabolic panel     Status: Abnormal  ° Collection Time: 03/23/19  3:23 PM  °Result Value Ref Range  ° Sodium 138 135 - 145 mmol/L  ° Potassium 4.6 3.5 - 5.1 mmol/L  ° Chloride 106 98 - 111 mmol/L  ° CO2 23 22 - 32 mmol/L  ° Glucose, Bld 145 (H) 70 -  99 mg/dL  ° BUN 30 (H) 6 - 20 mg/dL  ° Creatinine, Ser 1.01 (H) 0.44 - 1.00 mg/dL  ° Calcium 8.9 8.9 - 10.3 mg/dL  ° Total Protein 7.6 6.5 - 8.1 g/dL  ° Albumin 3.9 3.5 - 5.0 g/dL  ° AST 30 15 - 41 U/L  ° ALT 25 0 - 44 U/L  ° Alkaline Phosphatase 56 38 - 126 U/L  ° Total Bilirubin 0.4 0.3 - 1.2 mg/dL  ° GFR calc non Af Amer >60 >60 mL/min  ° GFR calc Af Amer >60 >60 mL/min  ° Anion gap 9 5 - 15  °  Comment: Performed at [facility], [address] [place],  [address]  °CBC with Differential/Platelet     Status: None  ° Collection Time: 03/23/19  3:23 PM  °Result Value Ref Range  ° WBC 6.2 4.0 - 10.5 K/uL  ° RBC 4.48 3.87 - 5.11 MIL/uL  ° Hemoglobin 12.9 12.0 - 15.0 g/dL  ° HCT 39.8 36.0 - 46.0 %  ° MCV 88.8 80.0 - 100.0 fL  ° MCH 28.8 26.0 - 34.0 pg  ° MCHC 32.4 30.0 - 36.0 g/dL  ° RDW 12.6 11.5 - 15.5 %  ° Platelets 208 150 - 400 K/uL  ° nRBC 0.0 0.0 - 0.2 %  ° Neutrophils Relative % 47 %  ° Neutro Abs 3.0 1.7 - 7.7 K/uL  ° Lymphocytes Relative 36 %  ° Lymphs Abs 2.2 0.7 - 4.0 K/uL  ° Monocytes Relative 9 %  ° Monocytes Absolute 0.6 0.1 - 1.0 K/uL  °   Eosinophils Relative 7 %   Eosinophils Absolute 0.4 0.0 - 0.5 K/uL   Basophils Relative 1 %   Basophils Absolute 0.0 0.0 - 0.1 K/uL   Immature Granulocytes 0 %   Abs Immature Granulocytes 0.01 0.00 - 0.07 K/uL    Comment: Performed at [facility], [address] [place], [place] [address]  Glucose, capillary     Status: Abnormal   Collection Time: 04/07/19 11:50 AM  Result Value Ref Range   Glucose-Capillary 124 (H) 70 - 99 mg/dL  Surgical pathology     Status: None   Collection Time: 05/04/19 11:54 AM  Result Value Ref Range   SURGICAL PATHOLOGY      Surgical Pathology CASE: [id number] PATIENT: Marquisa Stenzel Surgical Pathology Report     SPECIMEN SUBMITTED: A. Parotid, left  CLINICAL HISTORY: History of lymphoma, now with hypermetabolic bilateral parotid gland nodules, post US guided BX of dominant  hypermetabolic left sided parotid gland nodule  PRE-OPERATIVE DIAGNOSIS: None provided  POST-OPERATIVE DIAGNOSIS: None provided.     DIAGNOSIS: A.  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 - See comment.  Comment: The case was sent for hematopathology consultation and was read by Dr. Fortino Sic at [facility] laboratories.  Per the outside report, the Diff-Quik stained touch preparations reveal predominantly small lymphocytes with scattered larger forms.  The HE stained sections demonstrate glandular tissue with an infiltrate composed of predominantly small lymphocytes and numerous plasma cells . Occasional lymphoepithelial lesions are noted.  Immunohistochemical stains were performed at [facility] with the following results:  CD3 stains scattered small lymphocytes.  CD20 stains numerous small lymphocytes. Kappa/lambda (ISH) reveal a monotypic kappa staining of the plasma cells.  CD5 stain similar to CD3.  CD10 stains and stromal cells and is otherwise negative.  Ki-67 stains 10 to 20% of cells overall.  CD138 stained epithelium and numerous plasma cells.  EBV (ISH) is negative with appropriate control.  Flow cytometry was limited by low cellularity and low specimen viability.   GROSS DESCRIPTION: A. Labeled: Left parotid Received: Saline Tissue fragment(s): Multiple Size: Aggregate, 0.7 x 0.2 x 0.1 cm Description: Received are disrupted needle core biopsy fragments of pale-tan soft tissue.  Two touch preps with Diff-Quik stain are performed.  Representative sections are submitted in RPMI for flow cytometry.  The remainder of the specimen is filtered into a mesh  bag and submitted entirely for routine histology in cassette 1.   Final Diagnosis performed by Betsy Pries, MD.   Electronically signed 05/11/2019 2:42:55PM The electronic signature indicates that the named  Attending Pathologist has evaluated the specimen  Technical component performed at [facility], [address] [place], [place] [address] Lab: [phone] Dir: Rush Farmer, MD, MMM  Professional component performed at [facility], [facility], [address], [place], [place] [address] Lab: [phone] Dir: Dellia Nims. Reuel Derby, MD   Surgical pathology     Status: None   Collection Time: 05/04/19 11:55 AM  Result Value Ref Range   SURGICAL PATHOLOGY      Surgical Pathology CASE: [id number] PATIENT: Alexyss Mourer Surgical Pathology Report     SPECIMEN SUBMITTED: A. Parotid, right  CLINICAL HISTORY: History of lymphoma, now with hypermetabolic bilateral parotid gland nodules, post US guided BX of dominant hypermetabolic right sided parotid gland nodule  PRE-OPERATIVE DIAGNOSIS: None provided  POST-OPERATIVE DIAGNOSIS: None provided.     DIAGNOSIS: A. PAROTID, RIGHT, ULTRASOUND-GUIDED NEEDLE CORE BIOPSY: - EXTRANODAL MARGINAL  ZONE LYMPHOMA OF MUCOSA-ASSOCIATED LYMPHOID °TISSUE. °- SEE COMMENT. ° °Comment: °The case was sent for hematopathology consultation and was read by Dr. °Stephanie Peters Matthews at [facility].  Per the °outside report, the Diff-Quik stained touch preparations reveal °predominantly small lymphocytes with scattered larger forms.  The HE °stained sections demonstrate glandular tissue with an infiltrate °composed of predominantly small lymphocytes and numerous plasma cel ls. °Occasional lymphoepithelial lesions are noted.  Immunohistochemical °stains were performed at [facility] with the following results: CD3 stains °scattered small lymphocytes.  CD20 stains numerous small lymphocytes. °Kappa/lambda (ISH) reveal a monotypic kappa staining of the plasma °cells.  CD5 stain similar to CD3.  CD10 stains and stromal cells and is °otherwise negative.  Ki-67 stains 10 to 20% of cells overall.  CD138 °stained epithelium and numerous plasma  cells.  EBV (ISH) is negative °with appropriate control.  Flow cytometry was limited by low cellularity °and low specimen viability and demonstrated nonspecific light chain °binding. ° °GROSS DESCRIPTION: °A. Labeled: Right parotid °Received: Saline °Tissue fragment(s): Multiple °Size: Aggregate, 0.5 x 0.1 x 0.1 cm °Description: Received are disrupted needle core biopsy fragments of °pale-tan soft tissue.  Two touch preps with Diff-Quik stain are °performed.  Representative sections are submitted in RPMI for flow °cytometry.  The  remainder of the specimen is filtered into a mesh bag °and submitted entirely for routine histology in cassette 1. ° ° °Final Diagnosis performed by Alyssa Kraynie, MD.   Electronically signed °05/11/2019 2:43:36PM °The electronic signature indicates that the named Attending Pathologist °has evaluated the specimen ° Technical component performed at [facility], [address], [place], °[place] [address] Lab: [phone] Dir: Sanjai Nagendra, MD, MMM  Professional °component performed at [facility], [facility], [address], [place], [place] [address] Lab: [phone] Dir: Tara C. °Rubinas, MD °  °Glucose, capillary     Status: Abnormal  ° Collection Time: 05/04/19 12:02 PM  °Result Value Ref Range  ° Glucose-Capillary 118 (H) 70 - 99 mg/dL  °Urine Culture     Status: Abnormal  ° Collection Time: 05/20/19 12:24 PM  ° Specimen: Urine  °Result Value Ref Range  ° MICRO NUMBER: [id number]   ° SPECIMEN QUALITY: Adequate   ° Sample Source URINE, CLEAN CATCH   ° STATUS: FINAL   ° ISOLATE 1: Escherichia coli (A)   °  Comment: Greater than 100,000 CFU/mL of Escherichia coli  °    Susceptibility  ° Escherichia coli - URINE CULTURE, REFLEX  °  AMOX/CLAVULANIC <=2 Sensitive   °  AMPICILLIN <=2 Sensitive   °  AMPICILLIN/SULBACTAM <=2 Sensitive   °  CEFAZOLIN* <=4 Not Reportable   °   * For infections other than uncomplicated UTIcaused by E. coli, K. pneumoniae or P. mirabilis:Cefazolin is  resistant if MIC > or = 8 mcg/mL.(Distinguishing susceptible versus intermediatefor isolates with MIC < or = 4 mcg/mL requiresadditional testing.)For uncomplicated UTI caused by E. coli,K. pneumoniae or P. mirabilis: Cefazolin issusceptible if MIC <32 mcg/mL and predictssusceptible to the oral agents cefaclor, cefdinir,cefpodoxime, cefprozil, cefuroxime, cephalexinand loracarbef.  °  CEFEPIME <=1 Sensitive   °  CEFTRIAXONE <=1 Sensitive   °  CIPROFLOXACIN 0.5 Intermediate   °  LEVOFLOXACIN 1 Intermediate   °  ERTAPENEM <=0.5 Sensitive   °  GENTAMICIN <=1 Sensitive   °  IMIPENEM <=0.25 Sensitive   °  NITROFURANTOIN <=16 Sensitive   °  PIP/TAZO <=4 Sensitive   °  TOBRAMYCIN <=1 Sensitive   °  TRIMETH/SULFA* <=20 Sensitive   °   *   For infections other than uncomplicated UTIcaused by E. coli, K. pneumoniae or P. mirabilis:Cefazolin is resistant if MIC > or = 8 mcg/mL.(Distinguishing susceptible versus intermediatefor isolates with MIC < or = 4 mcg/mL requiresadditional testing.)For uncomplicated UTI caused by E. coli,K. pneumoniae or P. mirabilis: Cefazolin issusceptible if MIC <32 mcg/mL and predictssusceptible to the oral agents cefaclor, cefdinir,cefpodoxime, cefprozil, cefuroxime, cephalexinand loracarbef.Legend:S = Susceptible  I = IntermediateR = Resistant  NS = Not susceptible* = Not tested  NR = Not reported**NN = See antimicrobic comments  °Urinalysis     Status: Abnormal  ° Collection Time: 05/20/19 12:24 PM  °Result Value Ref Range  ° Color, Urine YELLOW YELLOW  ° APPearance CLEAR CLEAR  ° Specific Gravity, Urine 1.017 1.001 - 1.03  ° pH 6.5 5.0 - 8.0  ° Glucose, UA NEGATIVE NEGATIVE  ° Bilirubin Urine NEGATIVE NEGATIVE  ° Ketones, ur NEGATIVE NEGATIVE  ° Hgb urine dipstick 3+ (A) NEGATIVE  ° Protein, ur 2+ (A) NEGATIVE  ° Nitrite POSITIVE (A) NEGATIVE  ° Leukocytes,Ua 1+ (A) NEGATIVE  °TSH+Prl+FSH+TestT+LH+DHEA S...     Status: Abnormal  ° Collection Time: 05/24/19  2:33 PM  °Result Value Ref Range  ° TSH  4.650 (H) 0.450 - 4.500 uIU/mL  ° Testosterone <3 (L) 8 - 48 ng/dL  ° Testosterone, Free 0.2 0.0 - 4.2 pg/mL  ° LH 45.9 mIU/mL  °  Comment:                     Adult Female: °                      Follicular phase      2.4 -  12.6 °                      Ovulation phase      14.0 -  95.6 °                      Luteal phase          1.0 -  11.4 °                      Postmenopausal        7.7 -  58.5 °  ° FSH 73.3 mIU/mL  °  Comment:                     Adult Female: °                      Follicular phase      3.5 -  12.5 °                      Ovulation phase       4.7 -  21.5 °                      Luteal phase          1.7 -   7.7 °                      Postmenopausal       25.8 - 134.8 °  ° Prolactin 21.3 4.8 - 23.3 ng/mL  ° 17-Hydroxyprogesterone 14 ng/dL  °  Comment:                             Adult Female                            Follicular        15 -  70                            Luteal            35 - 290    DHEA-SO4 47.1 41.2 - 243.7 ug/dL   Androstenedione 33 (L) 41 - 262 ng/dL    Comment: This test was developed and its performance characteristics determined by [facility]. It has not been cleared or approved by the Food and Drug Administration.   Estradiol     Status: None   Collection Time: 05/24/19  2:33 PM  Result Value Ref Range   Estradiol 14.2 pg/mL    Comment:                     Adult Female:                       Follicular phase   94.1 -   166.0                       Ovulation phase    85.8 -   498.0                       Luteal phase       43.8 -   211.0                       Postmenopausal     <6.0 -    54.7                     Pregnancy                       1st trimester     215.0 - >4300.0 Roche ECLIA methodology   Protime-INR upon arrival     Status: None   Collection Time: 05/25/19  8:08 AM  Result Value Ref Range   Prothrombin Time 11.8 11.4 - 15.2 seconds   INR 0.9 0.8 - 1.2    Comment: (NOTE) INR goal varies based on device and disease states. Performed at  [facility], [address] [place], [place] [address]   CBC with Differential/Platelet     Status: Abnormal   Collection Time: 05/25/19  8:08 AM  Result Value Ref Range   WBC 8.1 4.0 - 10.5 K/uL   RBC 5.18 (H) 3.87 - 5.11 MIL/uL   Hemoglobin 15.0 12.0 - 15.0 g/dL   HCT 45.9 36.0 - 46.0 %   MCV 88.6 80.0 - 100.0 fL   MCH 29.0 26.0 - 34.0 pg   MCHC 32.7 30.0 - 36.0 g/dL   RDW 13.7 11.5 - 15.5 %   Platelets 149 (L) 150 - 400 K/uL   nRBC 0.0 0.0 - 0.2 %   Neutrophils Relative % 47 %   Neutro Abs 3.8 1.7 - 7.7 K/uL   Lymphocytes Relative 41 %   Lymphs Abs 3.3 0.7 - 4.0 K/uL   Monocytes Relative 8 %   Monocytes Absolute 0.7 0.1 - 1.0 K/uL   Eosinophils Relative 3 %   Eosinophils Absolute 0.2 0.0 -  0.5 K/uL  ° Basophils Relative 1 %  ° Basophils Absolute 0.1 0.0 - 0.1 K/uL  ° Immature Granulocytes 0 %  ° Abs Immature Granulocytes 0.03 0.00 - 0.07 K/uL  °  Comment: Performed at [facility], [address] [place], [place] [address]  °Glucose, capillary     Status: Abnormal  ° Collection Time: 05/25/19  8:43 AM  °Result Value Ref Range  ° Glucose-Capillary 102 (H) 70 - 99 mg/dL  °Lactate dehydrogenase     Status: None  ° Collection Time: 05/28/19  8:12 AM  °Result Value Ref Range  ° LDH 109 98 - 192 U/L  °  Comment: Performed at [facility], [address] [place], [place] [address]  °Comprehensive metabolic panel     Status: Abnormal  ° Collection Time: 05/28/19  8:12 AM  °Result Value Ref Range  ° Sodium 138 135 - 145 mmol/L  ° Potassium 4.6 3.5 - 5.1 mmol/L  ° Chloride 107 98 - 111 mmol/L  ° CO2 22 22 - 32 mmol/L  ° Glucose, Bld 151 (H) 70 - 99 mg/dL  ° BUN 32 (H) 6 - 20 mg/dL  ° Creatinine, Ser 1.06 (H) 0.44 - 1.00 mg/dL  ° Calcium 9.0 8.9 - 10.3 mg/dL  ° Total Protein 7.5 6.5 - 8.1 g/dL  ° Albumin 3.8 3.5 - 5.0 g/dL  ° AST 22 15 - 41 U/L  ° ALT 23 0 - 44 U/L  ° Alkaline Phosphatase 59 38 - 126 U/L  ° Total Bilirubin 0.6 0.3 - 1.2 mg/dL  ° GFR calc non Af Amer >60  >60 mL/min  ° GFR calc Af Amer >60 >60 mL/min  ° Anion gap 9 5 - 15  °  Comment: Performed at [facility], [address] [place], [place] [address]  °CBC with Differential/Platelet     Status: Abnormal  ° Collection Time: 05/28/19  8:12 AM  °Result Value Ref Range  ° WBC 5.7 4.0 - 10.5 K/uL  ° RBC 4.73 3.87 - 5.11 MIL/uL  ° Hemoglobin 13.5 12.0 - 15.0 g/dL  ° HCT 41.3 36.0 - 46.0 %  ° MCV 87.3 80.0 - 100.0 fL  ° MCH 28.5 26.0 - 34.0 pg  ° MCHC 32.7 30.0 - 36.0 g/dL  ° RDW 13.3 11.5 - 15.5 %  ° Platelets 149 (L) 150 - 400 K/uL  ° nRBC 0.0 0.0 - 0.2 %  ° Neutrophils Relative % 52 %  ° Neutro Abs 2.9 1.7 - 7.7 K/uL  ° Lymphocytes Relative 32 %  ° Lymphs Abs 1.8 0.7 - 4.0 K/uL  ° Monocytes Relative 11 %  ° Monocytes Absolute 0.6 0.1 - 1.0 K/uL  ° Eosinophils Relative 4 %  ° Eosinophils Absolute 0.3 0.0 - 0.5 K/uL  ° Basophils Relative 1 %  ° Basophils Absolute 0.0 0.0 - 0.1 K/uL  ° Immature Granulocytes 0 %  ° Abs Immature Granulocytes 0.02 0.00 - 0.07 K/uL  °  Comment: Performed at [facility], [address] [place], [place] [address]  ° ° ° °Assessment: 43 y.o. [id number] follow up for postmenopausal bleeding ° °Plan: °Problem List Items Addressed This Visit   ° None  °  °Visit Diagnoses   ° Postmenopausal bleeding    -  Primary  °  ° ° °1) Postmenopausal bleeding - ultrasound today reveals endometrial stripe less than 4mm.  This effectively rules out endometrial hyperplasia or malignancy.  Endometrial biopsy is therefore not warranted, and   etiology can be attributed to endometrial atrophy. Labs consistent with postmenopausal status. ° °2) A total of 15 minutes were spent in face-to-face contact with the patient during this encounter with over half of that time devoted to counseling and coordination of care. ° °3) Return in about 6 months (around 12/02/2019) for follow up and pap. ° ° ° , MD, FACOG °[facility], [facility] °06/01/2019, 3:21 PM ° ° ° ° ° ° ° ° ° °

## 2019-06-02 ENCOUNTER — Encounter: Payer: Self-pay | Admitting: Oncology

## 2019-06-02 DIAGNOSIS — M47816 Spondylosis without myelopathy or radiculopathy, lumbar region: Secondary | ICD-10-CM | POA: Insufficient documentation

## 2019-06-02 DIAGNOSIS — G8929 Other chronic pain: Secondary | ICD-10-CM | POA: Insufficient documentation

## 2019-06-02 DIAGNOSIS — R682 Dry mouth, unspecified: Secondary | ICD-10-CM | POA: Insufficient documentation

## 2019-06-02 DIAGNOSIS — M25552 Pain in left hip: Secondary | ICD-10-CM | POA: Diagnosis not present

## 2019-06-02 DIAGNOSIS — E669 Obesity, unspecified: Secondary | ICD-10-CM | POA: Insufficient documentation

## 2019-06-04 ENCOUNTER — Inpatient Hospital Stay: Payer: Medicaid Other

## 2019-06-04 ENCOUNTER — Inpatient Hospital Stay: Payer: Medicaid Other | Admitting: *Deleted

## 2019-06-04 ENCOUNTER — Other Ambulatory Visit: Payer: Self-pay

## 2019-06-04 VITALS — BP 125/84 | HR 81 | Temp 97.7°F | Resp 18 | Wt 241.6 lb

## 2019-06-04 DIAGNOSIS — Z95828 Presence of other vascular implants and grafts: Secondary | ICD-10-CM

## 2019-06-04 DIAGNOSIS — Z5112 Encounter for antineoplastic immunotherapy: Secondary | ICD-10-CM | POA: Diagnosis not present

## 2019-06-04 DIAGNOSIS — C8580 Other specified types of non-Hodgkin lymphoma, unspecified site: Secondary | ICD-10-CM

## 2019-06-04 DIAGNOSIS — C884 Extranodal marginal zone B-cell lymphoma of mucosa-associated lymphoid tissue [MALT-lymphoma]: Secondary | ICD-10-CM

## 2019-06-04 LAB — CBC WITH DIFFERENTIAL/PLATELET
Abs Immature Granulocytes: 0.02 10*3/uL (ref 0.00–0.07)
Basophils Absolute: 0.1 10*3/uL (ref 0.0–0.1)
Basophils Relative: 1 %
Eosinophils Absolute: 0.3 10*3/uL (ref 0.0–0.5)
Eosinophils Relative: 4 %
HCT: 45.9 % (ref 36.0–46.0)
Hemoglobin: 15.3 g/dL — ABNORMAL HIGH (ref 12.0–15.0)
Immature Granulocytes: 0 %
Lymphocytes Relative: 24 %
Lymphs Abs: 1.9 10*3/uL (ref 0.7–4.0)
MCH: 28.6 pg (ref 26.0–34.0)
MCHC: 33.3 g/dL (ref 30.0–36.0)
MCV: 85.8 fL (ref 80.0–100.0)
Monocytes Absolute: 0.6 10*3/uL (ref 0.1–1.0)
Monocytes Relative: 8 %
Neutro Abs: 5.1 10*3/uL (ref 1.7–7.7)
Neutrophils Relative %: 63 %
Platelets: 208 10*3/uL (ref 150–400)
RBC: 5.35 MIL/uL — ABNORMAL HIGH (ref 3.87–5.11)
RDW: 13.2 % (ref 11.5–15.5)
WBC: 7.9 10*3/uL (ref 4.0–10.5)
nRBC: 0 % (ref 0.0–0.2)

## 2019-06-04 LAB — COMPREHENSIVE METABOLIC PANEL
ALT: 29 U/L (ref 0–44)
AST: 25 U/L (ref 15–41)
Albumin: 4.3 g/dL (ref 3.5–5.0)
Alkaline Phosphatase: 65 U/L (ref 38–126)
Anion gap: 12 (ref 5–15)
BUN: 20 mg/dL (ref 6–20)
CO2: 22 mmol/L (ref 22–32)
Calcium: 9.8 mg/dL (ref 8.9–10.3)
Chloride: 105 mmol/L (ref 98–111)
Creatinine, Ser: 1.04 mg/dL — ABNORMAL HIGH (ref 0.44–1.00)
GFR calc Af Amer: >60 mL/min (ref >60)
GFR calc non Af Amer: >60 mL/min (ref >60)
Glucose, Bld: 132 mg/dL — ABNORMAL HIGH (ref 70–99)
Potassium: 4.4 mmol/L (ref 3.5–5.1)
Sodium: 139 mmol/L (ref 135–145)
Total Bilirubin: 0.8 mg/dL (ref 0.3–1.2)
Total Protein: 8.4 g/dL — ABNORMAL HIGH (ref 6.5–8.1)

## 2019-06-04 MED ORDER — SODIUM CHLORIDE 0.9 % IV SOLN
Freq: Once | INTRAVENOUS | Status: AC
  Administered 2019-06-04: 11:00:00 via INTRAVENOUS
  Filled 2019-06-04: qty 250

## 2019-06-04 MED ORDER — SODIUM CHLORIDE 0.9% FLUSH
10.0000 mL | Freq: Once | INTRAVENOUS | Status: AC
  Administered 2019-06-04: 10 mL via INTRAVENOUS
  Filled 2019-06-04: qty 10

## 2019-06-04 MED ORDER — SODIUM CHLORIDE 0.9 % IV SOLN: Freq: Once | INTRAVENOUS | Status: DC

## 2019-06-04 MED ORDER — DIPHENHYDRAMINE HCL 25 MG PO CAPS
50.0000 mg | ORAL_CAPSULE | Freq: Once | ORAL | Status: AC
  Administered 2019-06-04: 50 mg via ORAL
  Filled 2019-06-04: qty 2

## 2019-06-04 MED ORDER — ACETAMINOPHEN 325 MG PO TABS
650.0000 mg | ORAL_TABLET | Freq: Once | ORAL | Status: AC
  Administered 2019-06-04: 650 mg via ORAL
  Filled 2019-06-04: qty 2

## 2019-06-04 MED ORDER — SODIUM CHLORIDE 0.9 % IV SOLN
375.0000 mg/m2 | Freq: Once | INTRAVENOUS | Status: AC
  Administered 2019-06-04: 900 mg via INTRAVENOUS
  Filled 2019-06-04: qty 50

## 2019-06-04 MED ORDER — HEPARIN SOD (PORK) LOCK FLUSH 100 UNIT/ML IV SOLN
500.0000 [IU] | Freq: Once | INTRAVENOUS | Status: AC | PRN
  Administered 2019-06-04: 500 [IU]
  Filled 2019-06-04: qty 5

## 2019-06-04 NOTE — Progress Notes (Signed)
Per MD, Dr. Janese Banks, order: proceed with scheduled Ruxience infusion today.

## 2019-06-07 ENCOUNTER — Encounter: Payer: Self-pay | Admitting: Family Medicine

## 2019-06-07 DIAGNOSIS — K759 Inflammatory liver disease, unspecified: Secondary | ICD-10-CM

## 2019-06-07 DIAGNOSIS — Z944 Liver transplant status: Secondary | ICD-10-CM

## 2019-06-08 DIAGNOSIS — C8580 Other specified types of non-Hodgkin lymphoma, unspecified site: Secondary | ICD-10-CM

## 2019-06-08 HISTORY — DX: Other specified types of non-hodgkin lymphoma, unspecified site: C85.80

## 2019-06-08 MED ORDER — TACROLIMUS 1 MG PO CAPS: 1.0000 mg | ORAL_CAPSULE | ORAL | 0 refills | Status: DC

## 2019-06-09 ENCOUNTER — Encounter: Payer: Self-pay | Admitting: Oncology

## 2019-06-10 ENCOUNTER — Encounter: Payer: Self-pay | Admitting: Psychiatry

## 2019-06-10 ENCOUNTER — Other Ambulatory Visit: Payer: Self-pay

## 2019-06-10 ENCOUNTER — Ambulatory Visit (INDEPENDENT_AMBULATORY_CARE_PROVIDER_SITE_OTHER): Payer: Medicaid Other | Admitting: Psychiatry

## 2019-06-10 ENCOUNTER — Telehealth: Payer: Self-pay

## 2019-06-10 DIAGNOSIS — F4310 Post-traumatic stress disorder, unspecified: Secondary | ICD-10-CM

## 2019-06-10 DIAGNOSIS — Z008 Encounter for other general examination: Secondary | ICD-10-CM

## 2019-06-10 DIAGNOSIS — F3130 Bipolar disorder, current episode depressed, mild or moderate severity, unspecified: Secondary | ICD-10-CM | POA: Diagnosis not present

## 2019-06-10 MED ORDER — QUETIAPINE FUMARATE ER 50 MG PO TB24: 50.0000 mg | ORAL_TABLET | Freq: Every day | ORAL | 1 refills | Status: DC

## 2019-06-10 MED ORDER — HYDROXYZINE PAMOATE 25 MG PO CAPS: 25.0000 mg | ORAL_CAPSULE | Freq: Two times a day (BID) | ORAL | 1 refills | Status: DC | PRN

## 2019-06-10 NOTE — Progress Notes (Signed)
Virtual Visit via Video Note  I connected with Alison [name] on 06/10/19 at 11:00 AM EDT by a video enabled telemedicine application and verified that I am speaking with the correct person using two identifiers.   I discussed the limitations of evaluation and management by telemedicine and the availability of in person appointments. The patient expressed understanding and agreed to proceed.   I discussed the assessment and treatment plan with the patient. The patient was provided an opportunity to ask questions and all were answered. The patient agreed with the plan and demonstrated an understanding of the instructions.   The patient was advised to call back or seek an in-person evaluation if the symptoms worsen or if the condition fails to improve as anticipated.    Psychiatric Initial Adult Assessment   Patient Identification: Alison [name] MRN:  [id number] Date of Evaluation:  06/10/2019 Referral Source: Dr.Bilal Lateef Chief Complaint:   Chief Complaint    Establish Care; Anxiety; Depression; Post-Traumatic Stress Disorder; Other     Visit Diagnosis:    ICD-10-CM   1. Evaluation by psychiatric service required  Z00.8   2. Bipolar I disorder, most recent episode depressed ([place])  F31.30 Lipid panel    Hemoglobin A1C    Thyroid Panel With TSH    Prolactin    EKG 12-Lead    QUEtiapine (SEROQUEL XR) 50 MG TB24 24 hr tablet  3. PTSD (post-traumatic stress disorder)  F43.10 Hemoglobin A1C    Thyroid Panel With TSH    EKG 12-Lead    QUEtiapine (SEROQUEL XR) 50 MG TB24 24 hr tablet    hydrOXYzine (VISTARIL) 25 MG capsule    History of Present Illness:  Alison [name] is a 43 year old Caucasian female, single, currently lives in [place], originally from [place], was evaluated by telemedicine today.  Patient has a history of bipolar disorder, PTSD, history of borderline personality disorder, chronic pain from fibromyalgia and trigeminal neuralgia, history of liver transplant, B-cell  lymphoma,SLE , diabetes, hypertension, obstructive sleep apnea, migraine headaches, was evaluated by telemedicine today.  Patient was referred to the clinic by her pain provider for evaluation for substance abuse/mental health risk potential prior to initiation of pain management.  Patient today reports she struggles with pain from fibromyalgia as well as trigeminal neuralgia.  She reports she has severe pain and it affects her ability to function.  The pain also impacts her mood as well as sleep.  She reports she hence needed to establish care with pain management again.  She reports she uses a cane to walk and also has an Transport planner that she uses.  She reports she does live with housemates who help her with getting groceries for her.  That helps a lot.  Patient reports that she also struggles with mental health problems.  She has a history of bipolar disorder.  She was also diagnosed with borderline personality disorder as well as posttraumatic stress disorder in the past.  She reports she used to be under the care of a psychiatrist in [place].  She moved to [place] in May 2020 since her housemates were moving down here.  Since she did not have a place to live in [place] anymore she decided to move down here with them.  She reports the last medication that she remembered she was taking for bipolar disorder while in [place] was clonazepam.  She does not remember mood stabilizers that she may have tried in the past.  Patient describes episodes of manic symptoms when she is very  energetic, talks too fast, go without sleep for at least 48 hours.  She also describes depressive symptoms when she is very depressed and cannot get out of bed and is tearful and has sleep issues.  She reports she is currently going through a depressive phase since there is a lot of stressors including her health, as well as separated from her father who is currently in [place] and the current pandemic.   She currently denies any suicidality.  Patient does struggle with sleep now.  She is currently not on a sleep medication.  Patient denies any perceptual disturbances.  Patient does report a history of trauma.  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  Her mother passed away several years ago.  Patient reports she also went through [facility].  She reports she was diagnosed with PTSD due to having PTSD related symptoms.  She continues to have panic symptoms when she is overwhelmed and has racing heart rate and shortness of breath.  She denies any other PTSD symptoms at this time.  She reports she tries to cope with her panic symptoms by going to her safe place in her home which she has created for herself.  She distracts herself by doing craft and art projects.  Patient reports that she has used cannabis in the past.  The last time she used cannabis was in 2020.  She reports she may have used it a few times since she was struggling with pain.  She currently does not use them.  Patient denies abusing any other illicit substances or alcohol.  She denies abusing her pain medications in the past.    Associated Signs/Symptoms: Depression Symptoms:  depressed mood, anhedonia, insomnia, fatigue, difficulty concentrating, anxiety, panic attacks, disturbed sleep, decreased appetite, (Hypo) Manic Symptoms:  Distractibility, Elevated Mood, Impulsivity, Irritable Mood, Labiality of Mood, Anxiety Symptoms:  Excessive Worry, Panic Symptoms, Psychotic Symptoms:  denies PTSD Symptoms: Had a traumatic exposure:  yes as noted above  Past Psychiatric History: Patient reports 1 suicide attempt in 2000.  She reports she had inpatient mental health admission at least twice in the past around 2000 in [place].  She reports she never acted on suicidal thoughts after that.  Patient has a previous diagnosis of bipolar disorder, borderline  personality disorder, PTSD.  Patient was under the care of a psychiatrist in [place] in the past.  Previous Psychotropic Medications: Yes Does not remember medication she may have tried-however does remember Klonopin.  Substance Abuse History in the last 12 months:  No.  Consequences of Substance Abuse: Negative  Past Medical History:  Past Medical History:  Diagnosis Date  . Abnormal uterine bleeding   . Allergy   . Anxiety   . Arthritis   . Bipolar disorder (manic depression) ([place])   . Chronic kidney failure   . Chronic renal disease, stage III ([place])   . Diabetes mellitus without complication ([place])   . 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[place])   . FH: trigeminal neuralgia   . GERD (gastroesophageal reflux disease)   . Heart murmur   . Hypertension   . Kidney mass   . Lupus ([place])   . Lupus ([place])   . lymphoma   . Lymphoma ([place])   . Lymphoma ([place])   . Major depressive disorder   . Migraine   . Morbid obesity ([place])   . Neuromuscular disorder ([facility])    neuropathy  . Neuropathy   .  Personality disorder ([place])   . Post herpetic neuralgia   . PTSD (post-traumatic stress disorder)   . Renal disorder   . S/P liver transplant [facility])     Past Surgical History:  Procedure Laterality Date  . BONE MARROW BIOPSY  01/13/2015  . BREAST BIOPSY  12/2014  . BREAST BIOPSY  2011  . BREAST SURGERY    . CHOLECYSTECTOMY    . HERNIA REPAIR    . infusaport    . LIVER TRANSPLANT  12/17/1991  . LUMBAR PUNCTURE    . PORT A CATH INJECTION ([place] [facility])    . tumor removal  2015    Family Psychiatric History: Mother-bipolar disorder  Family History:  Family History  Problem Relation Age of Onset  . Heart disease Mother   . Hypertension Mother   . Cancer - Other Mother   . Bipolar disorder Mother   . Heart disease Father   . Hypertension Father   . Diabetes Father   . Parkinson's disease Maternal Grandmother   . Cancer Maternal Aunt   . Cancer Maternal Uncle   . Cancer Maternal  Grandfather   . Lupus Paternal Grandmother   . Hypertension Brother     Social History:   Social History   Socioeconomic History  . Marital status: Single    Spouse name: Not on file  . Number of children: 0  . Years of education: 9  . Highest education level: GED or equivalent  Occupational History  . Occupation: disabled  Social Needs  . Financial resource strain: Somewhat hard  . Food insecurity    Worry: Sometimes true    Inability: Sometimes true  . Transportation needs    Medical: Yes    Non-medical: Yes  Tobacco Use  . Smoking status: Never Smoker  . Smokeless tobacco: Never Used  Substance and Sexual Activity  . Alcohol use: Not Currently    Frequency: Never  . Drug use: Not Currently    Types: Marijuana    Comment: pain managment last used in early april  . Sexual activity: Not Currently  Lifestyle  . Physical activity    Days per week: 1 day    Minutes per session: 30 min  . Stress: Rather much  Relationships  . Social connections    Talks on phone: Once a week    Gets together: Three times a week    Attends religious service: More than 4 times per year    Active member of club or organization: No    Attends meetings of clubs or organizations: Never    Relationship status: Never married  Other Topics Concern  . Not on file  Social History Narrative  . Not on file    Additional Social History: Patient is single.  She currently lives in [place].  She moved to [place] from [place] in May 2020.  She moved here so that she can be with her housemates who are moving down here.  Her father currently lives in [place] in a facility under the [place].  Patient graduated ninth grade.  She denies having children.  She does have a history of trauma summarized above.  She denies legal problems.  Allergies:   Allergies  Allergen Reactions  . Carbamazepine Other (See Comments)    Medication interaction-prograf  . Hydrocodone-Acetaminophen Itching  .  Naproxen Itching  . Penicillin G Itching and Rash    Metabolic Disorder Labs: No results found for: HGBA1C, MPG Lab Results  Component Value Date  PROLACTIN 21.3 05/24/2019   No results found for: CHOL, TRIG, HDL, CHOLHDL, VLDL, LDLCALC Lab Results  Component Value Date   TSH 4.650 (H) 05/24/2019    Therapeutic Level Labs: No results found for: LITHIUM No results found for: CBMZ No results found for: VALPROATE  Current Medications: Current Outpatient Medications  Medication Sig Dispense Refill  . baclofen (LIORESAL) 20 MG tablet Take 1 tablet (20 mg total) by mouth 3 (three) times daily. 30 each 2  . Cranberry 500 MG TABS Take by mouth.    [name] HYDROmorphone (DILAUDID) 4 MG tablet Take 1 tablet by mouth every 6 (six) hours as needed.    [name] lisinopril (ZESTRIL) 20 MG tablet Take 1 tablet (20 mg total) by mouth 1 day or 1 dose. 90 tablet 1  . LORazepam (ATIVAN) 1 MG tablet Take 1 tablet by mouth at bedtime as needed.    . metFORMIN (GLUCOPHAGE) 1000 MG tablet Take 1 tablet by mouth 2 (two) times a day.    . mycophenolate (CELLCEPT) 250 MG capsule Take 1 capsule by mouth 2 (two) times a day.    . promethazine (PHENERGAN) 25 MG tablet Take 1 tablet by mouth as needed.    . tacrolimus (PROGRAF) 1 MG capsule Take 1 capsule (1 mg total) by mouth 1 day or 1 dose. 30 capsule 0  . tiZANidine (ZANAFLEX) 4 MG tablet Take 1 tablet (4 mg total) by mouth 2 (two) times daily as needed for muscle spasms. 30 tablet 1  . hydrOXYzine (VISTARIL) 25 MG capsule Take 1 capsule (25 mg total) by mouth 2 (two) times daily as needed. For sever anxiety attacks 60 capsule 1  . pregabalin (LYRICA) 200 MG capsule Take 1 capsule (200 mg total) by mouth 3 (three) times daily for 30 days. 90 capsule 1  . QUEtiapine (SEROQUEL XR) 50 MG TB24 24 hr tablet Take 1 tablet (50 mg total) by mouth at bedtime. 30 tablet 1   No current facility-administered medications for this visit.     Musculoskeletal: Strength & Muscle  Tone: UTA Gait & Station: reports uses a cane and scooter Patient leans: N/A  Psychiatric Specialty Exam: Review of Systems  Psychiatric/Behavioral: Positive for depression. The patient is nervous/anxious and has insomnia.   All other systems reviewed and are negative.   Last menstrual period 02/04/2018.There is no height or weight on file to calculate BMI.  General Appearance: Casual  Eye Contact:  Fair  Speech:  Clear and Coherent  Volume:  Normal  Mood:  Anxious and Depressed  Affect:  Appropriate  Thought Process:  Goal Directed and Descriptions of Associations: Intact  Orientation:  Full (Time, Place, and Person)  Thought Content:  Logical  Suicidal Thoughts:  No  Homicidal Thoughts:  No  Memory:  Immediate;   Fair Recent;   Fair Remote;   Fair  Judgement:  Fair  Insight:  Fair  Psychomotor Activity:  Normal  Concentration:  Concentration: Fair and Attention Span: Fair  Recall:  [employer] of Knowledge:Fair  Language: Fair  Akathisia:  No  Handed:  Right  AIMS (if indicated): denies tremors, rigidity  Assets:  Communication Skills Desire for Improvement Housing Social Support  ADL's:  Intact  Cognition: WNL  Sleep:  Poor   Screenings: PHQ2-9     Office Visit from 05/20/2019 in [facility] Office Visit from [facility]/05/2019 in [facility]  PHQ-2 Total Score  2  6  PHQ-9 Total Score  6  18  Assessment and Plan: Alison [name] is a 43 year old Caucasian female, single, on disability, has a history of bipolar disorder, PTSD, chronic pain from fibromyalgia and trigeminal neuralgia, status post liver transplant, B-cell lymphoma, SLE, obstructive sleep apnea, diabetes, hypertension was evaluated by telemedicine today.  Patient was referred to the clinic by her pain provider for possible evaluation of mental health/substance abuse risk potential prior to initiation of pain management.  Patient is biologically predisposed given her family  history, history of trauma as well as possibly an organic cause from her multiple health issues.  She has psychosocial stressors of being separated from her father, her own pain and other health issues.  Patient however currently denies any suicidality or substance abuse problems.  She does have social support from her housemates.  Patient is motivated to start treatment and also pursue psychotherapy.  Plan For chronic pain- patient is currently in the process of establishing care with pain management. Instruments used Clinical interview Screener and opioid assessment for patients with pain/revised Opioid risk tool Drug abuse screening test Alcohol use disorder identification test. PHQ 9, GAD 7  Based on clinical interview and instrument used at the time of evaluation the risk is determined to be moderate.  For bipolar disorder- unstable Patient agrees to restart medications as well as pursue psychotherapy sessions Start Seroquel XR 50 mg p.o. nightly  For PTSD-unstable Referred for CBT with therapist here in clinic. Start hydroxyzine 25 mg p.o. twice daily as needed for anxiety attacks Seroquel will help with sleep.  We will order the following labs-hemoglobin A1c, lipid panel, prolactin, thyroid panel.  We will order EKG to monitor QTC.  Discussed with patient to sign a release to obtain medical records from her previous psychiatrist in [place].  Follow-up in clinic in 4 weeks or sooner if needed.  October 5 at 10:30 AM  I have spent atleast 60 minutes non  face to face with patient today. More than 50 % of the time was spent for psychoeducation and supportive psychotherapy and care coordination. This note was generated in part or whole with voice recognition software. Voice recognition is usually quite accurate but there are transcription errors that can and very often do occur. I apologize for any typographical errors that were not detected and  corrected.        [name], MD 9/3/20205:44 PM

## 2019-06-10 NOTE — Telephone Encounter (Signed)
pt lab and ekg orders mailed out

## 2019-06-10 NOTE — Progress Notes (Signed)
Patient has spot on her right leg by knee she would like you to look at.

## 2019-06-10 NOTE — Patient Instructions (Signed)
Hydroxyzine capsules or tablets What is this medicine? HYDROXYZINE (hye [place] i zeen) is an antihistamine. This medicine is used to treat allergy symptoms. It is also used to treat anxiety and tension. This medicine can be used with other medicines to induce sleep before surgery. This medicine may be used for other purposes; ask your health care provider or pharmacist if you have questions. COMMON BRAND NAME(S): ANX, Atarax, Rezine, Vistaril What should I tell my health care provider before I take this medicine? They need to know if you have any of these conditions:  glaucoma  heart disease  history of irregular heartbeat  kidney disease  liver disease  lung or breathing disease, like asthma  stomach or intestine problems  thyroid disease  trouble passing urine  an unusual or allergic reaction to hydroxyzine, cetirizine, other medicines, foods, dyes or preservatives  pregnant or trying to get pregnant  breast-feeding How should I use this medicine? Take this medicine by mouth with a full glass of water. Follow the directions on the prescription label. You may take this medicine with food or on an empty stomach. Take your medicine at regular intervals. Do not take your medicine more often than directed. Talk to your pediatrician regarding the use of this medicine in children. Special care may be needed. While this drug may be prescribed for children as young as 73 years of age for selected conditions, precautions do apply. Patients over 36 years old may have a stronger reaction and need a smaller dose. Overdosage: If you think you have taken too much of this medicine contact a poison control center or emergency room at once. NOTE: This medicine is only for you. Do not share this medicine with others. What if I miss a dose? If you miss a dose, take it as soon as you can. If it is almost time for your next dose, take only that dose. Do not take double or extra doses. What may  interact with this medicine? Do not take this medicine with any of the following medications:  cisapride  dronedarone  pimozide  thioridazine This medicine may also interact with the following medications:  alcohol  antihistamines for allergy, cough, and cold  atropine  barbiturate medicines for sleep or seizures, like phenobarbital  certain antibiotics like erythromycin or clarithromycin  certain medicines for anxiety or sleep  certain medicines for bladder problems like oxybutynin, tolterodine  certain medicines for depression or psychotic disturbances  certain medicines for irregular heart beat  certain medicines for Parkinson's disease like benztropine, trihexyphenidyl  certain medicines for seizures like phenobarbital, primidone  certain medicines for stomach problems like dicyclomine, hyoscyamine  certain medicines for travel sickness like scopolamine  ipratropium  narcotic medicines for pain  other medicines that prolong the QT interval (an abnormal heart rhythm) like dofetilide This list may not describe all possible interactions. Give your health care provider a list of all the medicines, herbs, non-prescription drugs, or dietary supplements you use. Also tell them if you smoke, drink alcohol, or use illegal drugs. Some items may interact with your medicine. What should I watch for while using this medicine? Tell your doctor or health care professional if your symptoms do not improve. You may get drowsy or dizzy. Do not drive, use machinery, or do anything that needs mental alertness until you know how this medicine affects you. Do not stand or sit up quickly, especially if you are an older patient. This reduces the risk of dizzy or fainting spells. Alcohol may  interfere with the effect of this medicine. Avoid alcoholic drinks. Your mouth may get dry. Chewing sugarless gum or sucking hard candy, and drinking plenty of water may help. Contact your doctor if the  problem does not go away or is severe. This medicine may cause dry eyes and blurred vision. If you wear contact lenses you may feel some discomfort. Lubricating drops may help. See your eye doctor if the problem does not go away or is severe. If you are receiving skin tests for allergies, tell your doctor you are using this medicine. What side effects may I notice from receiving this medicine? Side effects that you should report to your doctor or health care professional as soon as possible:  allergic reactions like skin rash, itching or hives, swelling of the face, lips, or tongue  changes in vision  confusion  fast, irregular heartbeat  seizures  tremor  trouble passing urine or change in the amount of urine Side effects that usually do not require medical attention (report to your doctor or health care professional if they continue or are bothersome):  constipation  drowsiness  dry mouth  headache  tiredness This list may not describe all possible side effects. Call your doctor for medical advice about side effects. You may report side effects to FDA at 1-800-FDA-1088. Where should I keep my medicine? Keep out of the reach of children. Store at room temperature between 15 and 30 degrees C (59 and 86 degrees F). Keep container tightly closed. Throw away any unused medicine after the expiration date. NOTE: This sheet is a summary. It may not cover all possible information. If you have questions about this medicine, talk to your doctor, pharmacist, or health care provider.  2020 [employer] Standard (2018-09-14 13:19:55) Quetiapine Extended Release Tablets What is this medicine? QUETIAPINE (kwe TYE a peen) is an antipsychotic. It is used to treat schizophrenia and bipolar disorder, also known as manic-depression. It is also used to treat major depression in combination with antidepressants. This medicine may be used for other purposes; ask your health care provider or  pharmacist if you have questions. COMMON BRAND NAME(S): Seroquel XR What should I tell my health care provider before I take this medicine? They need to know if you have any of these conditions:  blockage in your bowel  cataracts  constipation  dehydration  diabetes  difficulty swallowing  glaucoma  heart disease  history of breast cancer  kidney disease  liver disease  low blood counts, like low white cell, platelet, or red cell counts  low blood pressure or dizziness when standing up  Parkinson's disease  previous heart attack  prostate disease  seizures  stomach or intestine problems  suicidal thoughts, plans or attempt; a previous suicide attempt by you or a family member  thyroid disease  trouble passing urine  an unusual or allergic reaction to quetiapine, other medicines, foods, dyes, or preservatives  pregnant or trying to get pregnant  breast-feeding How should I use this medicine? Take this medicine by mouth with a glass of water. Follow the directions on the prescription label. Do not cut, crush or chew this medicine. Take this medicine on an empty stomach or with a light meal. Do not take this medicine with a full heavy meal. Take your medicine at regular intervals. Do not take it more often than directed. Do not stop taking except on your doctor's advice. A special MedGuide will be given to you by the pharmacist with each prescription and refill.  Be sure to read this information carefully each time. Talk to your pediatrician regarding the use of this medicine in children. While this drug may be prescribed for children as young as 10 years for selected conditions, precautions do apply. Patients over age 4 years may have a stronger reaction to this medicine and need smaller doses. Overdosage: If you think you have taken too much of this medicine contact a poison control center or emergency room at once. NOTE: This medicine is only for you. Do not  share this medicine with others. What if I miss a dose? If you miss a dose, take it as soon as you can. If it is almost time for your next dose, take only that dose. Do not take double or extra doses. What may interact with this medicine? Do not take this medicine with any of the following medications:  cisapride  dronedarone  fluconazole  metoclopramide  pimozide  posaconazole  thioridazine This medicine may also interact with the following medications:  alcohol  antihistamines for allergy cough and cold  antiviral medicines for HIV or AIDS  atropine  certain medicines for bladder problems like oxybutynin, tolterodine  certain medicines for blood pressure  certain medicines for depression, anxiety, or psychotic disturbances  certain medicines for diabetes  certain medicines for Parkinson's disease  certain medicines for seizures like carbamazepine, phenobarbital, phenytoin  certain medicines for stomach problems like dicyclomine, hyoscyamine  certain medicines for travel sickness like scopolamine  cimetidine  erythromycin  ipratropium  other medicines that prolong the QT interval (cause an abnormal heart rhythm)  rifampin  steroid medicines like prednisone or cortisone This list may not describe all possible interactions. Give your health care provider a list of all the medicines, herbs, non-prescription drugs, or dietary supplements you use. Also tell them if you smoke, drink alcohol, or use illegal drugs. Some items may interact with your medicine. What should I watch for while using this medicine? Visit your doctor or health care professional for regular checks on your progress. It may be several weeks before you see the full effects of this medicine. Your health care provider may suggest that you have your eyes examined prior to starting this medicine, and every 6 months thereafter. If you have been taking this medicine regularly for some time, do not  suddenly stop taking it. You must gradually reduce the dose or your symptoms may get worse. Ask your doctor or health care professional for advice. This medicine may increase blood sugar. Ask your healthcare provider if changes in diet or medicines are needed if you have diabetes. Patients and their families should watch out for depression or thoughts of suicide that get worse. Also watch out for sudden or severe changes in feelings such as feeling anxious, agitated, panicky, irritable, hostile, aggressive, impulsive, severely restless, overly excited and hyperactive, or not being able to sleep. If this happens, especially at the beginning of antidepressant treatment or after a change in dose, call your health care professional. Dennis Bast may get drowsy or dizzy. Do not drive, use machinery, or do anything that needs mental alertness until you know how this medicine affects you. Do not stand or sit up quickly, especially if you are an older patient. This reduces the risk of dizzy or fainting spells. Alcohol may interfere with the effect of this medicine. Avoid alcoholic drinks. Do not treat yourself for colds, diarrhea or allergies. Ask your doctor or health care professional for advice, some ingredients may increase possible side effects. This  medicine can reduce the response of your body to heat or cold. Dress warm in cold weather and stay hydrated in hot weather. If possible, avoid extreme temperatures like saunas, hot tubs, very hot or cold showers, or activities that can cause dehydration such as vigorous exercise. What side effects may I notice from receiving this medicine? Side effects that you should report to your doctor or health care professional as soon as possible:  allergic reactions like skin rash, itching or hives, swelling of the face, lips, or tongue  changes in vision  difficulty swallowing  elevated mood, decreased need for sleep, racing thoughts, impulsive behavior  eye  pain  redness, blistering, peeling, or loosening of the skin, including inside the mouth  restlessness, pacing, inability to keep still  seizures  signs and symptoms of a dangerous change in heartbeat or heart rhythm like chest pain; dizziness; fast, irregular heartbeat; palpitations; feeling faint or lightheaded; falls; breathing problems  signs and symptoms of high blood sugar such as being more thirsty or hungry or having to urinate more than normal. You may also feel very tired or have blurry vision.  signs and symptoms of hypothyroidism like fatigue; increased sensitivity to cold; weight gain; hoarseness; thinning hair  signs and symptoms of infection like fever; chills; cough; sore throat; pain or trouble passing urine  signs and symptoms of low blood pressure like dizziness; feeling faint or lightheaded; falls; unusually weak or tired  signs and symptoms of neuroleptic malignant syndrome (NMS) like confusion; fast, irregular heartbeat; high fever; increased sweating; stiff muscles  signs and symptoms of a stroke like changes in vision; confusion; trouble speaking or understanding; severe headaches; sudden numbness or weakness of the face, arm or leg; trouble walking; dizziness; loss of balance or coordination  signs and symptoms of tardive dyskinesia, like uncontrollable head, mouth, neck, arm, or leg movements  suicidal thoughts, mood changes Side effects that usually do not require medical attention (report to your doctor or health care professional if they continue or are bothersome):  change in sex drive or performance  constipation  drowsiness  dry mouth  upset stomach  weight gain This list may not describe all possible side effects. Call your doctor for medical advice about side effects. You may report side effects to FDA at 1-800-FDA-1088. Where should I keep my medicine? Keep out of the reach of children. Store at room temperature between 15 and 30 degrees C  (59 and 86 degrees F). Throw away any unused medicine after the expiration date. NOTE: This sheet is a summary. It may not cover all possible information. If you have questions about this medicine, talk to your doctor, pharmacist, or health care provider.  2020 [employer] Standard (2018-09-15 08:54:29)

## 2019-06-11 ENCOUNTER — Inpatient Hospital Stay: Payer: Medicaid Other

## 2019-06-11 ENCOUNTER — Encounter: Payer: Self-pay | Admitting: Oncology

## 2019-06-11 ENCOUNTER — Inpatient Hospital Stay: Payer: Medicaid Other | Attending: Oncology | Admitting: *Deleted

## 2019-06-11 ENCOUNTER — Other Ambulatory Visit: Payer: Medicaid Other

## 2019-06-11 ENCOUNTER — Inpatient Hospital Stay (HOSPITAL_BASED_OUTPATIENT_CLINIC_OR_DEPARTMENT_OTHER): Payer: Medicaid Other | Admitting: Oncology

## 2019-06-11 ENCOUNTER — Other Ambulatory Visit: Payer: Self-pay

## 2019-06-11 ENCOUNTER — Ambulatory Visit: Payer: Medicaid Other

## 2019-06-11 VITALS — BP 108/76 | HR 75 | Temp 97.5°F | Resp 16 | Wt 247.2 lb

## 2019-06-11 DIAGNOSIS — G8929 Other chronic pain: Secondary | ICD-10-CM | POA: Insufficient documentation

## 2019-06-11 DIAGNOSIS — D47Z1 Post-transplant lymphoproliferative disorder (PTLD): Secondary | ICD-10-CM | POA: Diagnosis not present

## 2019-06-11 DIAGNOSIS — R5383 Other fatigue: Secondary | ICD-10-CM | POA: Diagnosis not present

## 2019-06-11 DIAGNOSIS — R5381 Other malaise: Secondary | ICD-10-CM | POA: Diagnosis not present

## 2019-06-11 DIAGNOSIS — Z944 Liver transplant status: Secondary | ICD-10-CM | POA: Insufficient documentation

## 2019-06-11 DIAGNOSIS — I129 Hypertensive chronic kidney disease with stage 1 through stage 4 chronic kidney disease, or unspecified chronic kidney disease: Secondary | ICD-10-CM | POA: Insufficient documentation

## 2019-06-11 DIAGNOSIS — C884 Extranodal marginal zone B-cell lymphoma of mucosa-associated lymphoid tissue [MALT-lymphoma]: Secondary | ICD-10-CM | POA: Insufficient documentation

## 2019-06-11 DIAGNOSIS — Z79899 Other long term (current) drug therapy: Secondary | ICD-10-CM | POA: Insufficient documentation

## 2019-06-11 DIAGNOSIS — M549 Dorsalgia, unspecified: Secondary | ICD-10-CM | POA: Diagnosis not present

## 2019-06-11 DIAGNOSIS — C8580 Other specified types of non-Hodgkin lymphoma, unspecified site: Secondary | ICD-10-CM

## 2019-06-11 DIAGNOSIS — E1122 Type 2 diabetes mellitus with diabetic chronic kidney disease: Secondary | ICD-10-CM | POA: Insufficient documentation

## 2019-06-11 DIAGNOSIS — Z5112 Encounter for antineoplastic immunotherapy: Secondary | ICD-10-CM | POA: Insufficient documentation

## 2019-06-11 DIAGNOSIS — Z5181 Encounter for therapeutic drug level monitoring: Secondary | ICD-10-CM | POA: Diagnosis not present

## 2019-06-11 DIAGNOSIS — Z7984 Long term (current) use of oral hypoglycemic drugs: Secondary | ICD-10-CM | POA: Diagnosis not present

## 2019-06-11 DIAGNOSIS — N183 Chronic kidney disease, stage 3 (moderate): Secondary | ICD-10-CM | POA: Insufficient documentation

## 2019-06-11 DIAGNOSIS — K219 Gastro-esophageal reflux disease without esophagitis: Secondary | ICD-10-CM | POA: Diagnosis not present

## 2019-06-11 DIAGNOSIS — Z95828 Presence of other vascular implants and grafts: Secondary | ICD-10-CM

## 2019-06-11 DIAGNOSIS — Z9221 Personal history of antineoplastic chemotherapy: Secondary | ICD-10-CM | POA: Diagnosis not present

## 2019-06-11 DIAGNOSIS — M1990 Unspecified osteoarthritis, unspecified site: Secondary | ICD-10-CM | POA: Insufficient documentation

## 2019-06-11 LAB — CBC WITH DIFFERENTIAL/PLATELET
Abs Immature Granulocytes: 0.03 10*3/uL (ref 0.00–0.07)
Basophils Absolute: 0.1 10*3/uL (ref 0.0–0.1)
Basophils Relative: 1 %
Eosinophils Absolute: 0.3 10*3/uL (ref 0.0–0.5)
Eosinophils Relative: 5 %
HCT: 40.8 % (ref 36.0–46.0)
Hemoglobin: 13.6 g/dL (ref 12.0–15.0)
Immature Granulocytes: 0 %
Lymphocytes Relative: 25 %
Lymphs Abs: 1.7 10*3/uL (ref 0.7–4.0)
MCH: 29.1 pg (ref 26.0–34.0)
MCHC: 33.3 g/dL (ref 30.0–36.0)
MCV: 87.4 fL (ref 80.0–100.0)
Monocytes Absolute: 0.6 10*3/uL (ref 0.1–1.0)
Monocytes Relative: 9 %
Neutro Abs: 4.1 10*3/uL (ref 1.7–7.7)
Neutrophils Relative %: 60 %
Platelets: 168 10*3/uL (ref 150–400)
RBC: 4.67 MIL/uL (ref 3.87–5.11)
RDW: 13.2 % (ref 11.5–15.5)
WBC: 6.8 10*3/uL (ref 4.0–10.5)
nRBC: 0 % (ref 0.0–0.2)

## 2019-06-11 LAB — COMPREHENSIVE METABOLIC PANEL
ALT: 22 U/L (ref 0–44)
AST: 20 U/L (ref 15–41)
Albumin: 3.8 g/dL (ref 3.5–5.0)
Alkaline Phosphatase: 60 U/L (ref 38–126)
Anion gap: 8 (ref 5–15)
BUN: 17 mg/dL (ref 6–20)
CO2: 25 mmol/L (ref 22–32)
Calcium: 9.3 mg/dL (ref 8.9–10.3)
Chloride: 106 mmol/L (ref 98–111)
Creatinine, Ser: 0.94 mg/dL (ref 0.44–1.00)
GFR calc Af Amer: >60 mL/min (ref >60)
GFR calc non Af Amer: >60 mL/min (ref >60)
Glucose, Bld: 162 mg/dL — ABNORMAL HIGH (ref 70–99)
Potassium: 4.3 mmol/L (ref 3.5–5.1)
Sodium: 139 mmol/L (ref 135–145)
Total Bilirubin: 0.6 mg/dL (ref 0.3–1.2)
Total Protein: 6.9 g/dL (ref 6.5–8.1)

## 2019-06-11 MED ORDER — ACETAMINOPHEN 325 MG PO TABS
650.0000 mg | ORAL_TABLET | Freq: Once | ORAL | Status: AC
  Administered 2019-06-11: 12:00:00 650 mg via ORAL
  Filled 2019-06-11: qty 2

## 2019-06-11 MED ORDER — HEPARIN SOD (PORK) LOCK FLUSH 100 UNIT/ML IV SOLN
500.0000 [IU] | Freq: Once | INTRAVENOUS | Status: AC
  Administered 2019-06-11: 15:00:00 500 [IU] via INTRAVENOUS

## 2019-06-11 MED ORDER — DIPHENHYDRAMINE HCL 25 MG PO CAPS
50.0000 mg | ORAL_CAPSULE | Freq: Once | ORAL | Status: AC
  Administered 2019-06-11: 12:00:00 50 mg via ORAL
  Filled 2019-06-11: qty 2

## 2019-06-11 MED ORDER — SODIUM CHLORIDE 0.9% FLUSH
10.0000 mL | Freq: Once | INTRAVENOUS | Status: AC
  Administered 2019-06-11: 11:00:00 10 mL via INTRAVENOUS
  Filled 2019-06-11: qty 10

## 2019-06-11 MED ORDER — SODIUM CHLORIDE 0.9 % IV SOLN
Freq: Once | INTRAVENOUS | Status: AC
  Administered 2019-06-11: 12:00:00 via INTRAVENOUS
  Filled 2019-06-11: qty 250

## 2019-06-11 MED ORDER — SODIUM CHLORIDE 0.9 % IV SOLN
Freq: Once | INTRAVENOUS | Status: AC
  Administered 2019-06-11: 12:00:00 via INTRAVENOUS
  Filled 2019-06-11: qty 160

## 2019-06-11 NOTE — Progress Notes (Signed)
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 She is currently on CellCept and tacrolimus for the same. She also has a history of post transplant lymphoproliferative disorder/DLBCL that was diagnosed in 2016. She is s/p 6 cycles of R-CHOP chemotherapy back then and was in complete remission 1. Her other past medical history significant for migraines, stage III chronic kidney disease, chemo-induced peripheral neuropathy, hypertension among other medical problems. With regards to her diffuse large B-cell lymphoma she was getting surveillance scans and this was all done in [place]. She has now moved to [facility] to be with her significant other.Patient was noted to have a prior kidney mass before which was biopsied and was not consistent with malignancy.  She underwent CT neck with contrast before she left [place] on 02/22/2019. She was noted to have soft tissue masses in both the parotid glands which were new as compared to prior exams and possibly represent lymph nodes. The largest one was seen in the left parotid gland measuring 1.3 x 1.1 cm. Before she could get a work-up for this patient moved to [place] and has not had any further work-up yet  Her other prior imaging is as follows: MRI thoracic spine without contrast in August 2019 showed moderate multilevel  spondylosis but no evidence of lymphoma. Multinodular thyroid in June 2019. MRI cervical spine May 2019 again showed spondylosis but no other acute pathology. MRI brain showed incidental partial opacification of the right and right sinus. No evidence of malignancy. I do not have any other PET CT scan or treatment records from the past.  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  Bone marrow biopsy was negative for lymphoma   Interval history-she has occasional neck pain but is otherwise doing well.  She also has chronic back pain.  Denies any other complaints at this time  ECOG PS- 1 Pain scale- 0   Review of systems- Review of Systems  Constitutional: Positive for malaise/fatigue. Negative for chills, fever and weight loss.  HENT: Negative for congestion, ear discharge and nosebleeds.   Eyes: Negative for blurred vision.  Respiratory: Negative for cough, hemoptysis, sputum production, shortness of breath and wheezing.   Cardiovascular: Negative for chest pain, palpitations, orthopnea and claudication.  Gastrointestinal: Negative for abdominal pain, blood in stool, constipation, diarrhea, heartburn, melena, nausea and vomiting.  Genitourinary: Negative for dysuria, flank pain, frequency, hematuria and urgency.  Musculoskeletal: Positive for back pain and neck pain. Negative for joint pain and myalgias.  Skin: Negative for rash.  Neurological: Negative for dizziness, tingling, focal weakness, seizures, weakness and headaches.  Endo/Heme/Allergies: Does not bruise/bleed easily.  Psychiatric/Behavioral: Negative for depression and suicidal ideas. The patient does not have insomnia.        Allergies  Allergen Reactions   Carbamazepine Other (See Comments)    Medication interaction-prograf   Hydrocodone-Acetaminophen Itching   Naproxen Itching   Penicillin G Itching and Rash  Past Medical History:  Diagnosis Date   Abnormal  uterine bleeding    Allergy    Anxiety    Arthritis    Bipolar disorder (manic depression) (HCC)    Chronic kidney failure    Chronic renal disease, stage III (HCC)    Diabetes mellitus without complication (HCC)    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HCC)    FH: trigeminal neuralgia    GERD (gastroesophageal reflux disease)    Heart murmur    Hypertension    Kidney mass    Lupus (HCC)    Lupus (HCC)    lymphoma    Lymphoma (HCC)    Lymphoma (HCC)    Major depressive disorder    Migraine    Morbid obesity (HCC)    Neuromuscular disorder ([place])    neuropathy   Neuropathy    Personality disorder ([place])    Post herpetic neuralgia    PTSD (post-traumatic stress disorder)    Renal disorder    S/P liver transplant ([place])      Past Surgical History:  Procedure Laterality Date   BONE MARROW BIOPSY  01/13/2015   BREAST BIOPSY  12/2014   BREAST BIOPSY  2011   BREAST SURGERY     CHOLECYSTECTOMY     HERNIA REPAIR     infusaport     LIVER TRANSPLANT  12/17/1991   LUMBAR PUNCTURE     PORT A CATH INJECTION (Garza-Sal[place] II HX)     tumor removal  2015    Social History   Socioeconomic History   Marital status: Single    Spouse name: Not on file   Number of children: 0   Years of education: 9   Highest education level: GED or equivalent  Occupational History   Occupation: disabled  Scientist, product/process development strain: Somewhat hard   Food insecurity    Worry: Sometimes true    Inability: Sometimes true   Transportation needs    Medical: Yes    Non-medical: Yes  Tobacco Use   Smoking status: Never Smoker   Smokeless tobacco: Never Used  Substance and Sexual Activity   Alcohol use: Not Currently    Frequency: Never   Drug use: Not Currently    Types: Marijuana    Comment: pain managment last used in early april   Sexual activity: Not Currently  Lifestyle   Physical activity    Days per week: 1 day    Minutes  per session: 30 min   Stress: Rather much  Relationships   Social connections    Talks on phone: Once a week    Gets together: Three times a week    Attends religious service: More than 4 times per year    Active member of club or organization: No    Attends meetings of clubs or organizations: Never    Relationship status: Never married   Intimate partner violence    Fear of current or ex partner: No    Emotionally abused: No    Physically abused: No    Forced sexual activity: No  Other Topics Concern   Not on file  Social History Narrative   Not on file    Family History  Problem Relation Age of Onset   Heart disease Mother    Hypertension Mother    Cancer - Other Mother    Bipolar disorder Mother    Heart disease Father    Hypertension Father    Diabetes Father  Parkinson's disease Maternal Grandmother    Cancer Maternal Aunt    Cancer Maternal Uncle    Cancer Maternal Grandfather    Lupus Paternal Grandmother    Hypertension Brother      Current Outpatient Medications:    baclofen (LIORESAL) 20 MG tablet, Take 1 tablet (20 mg total) by mouth 3 (three) times daily., Disp: 30 each, Rfl: 2   Cranberry 500 MG TABS, Take by mouth., Disp: , Rfl:    HYDROmorphone (DILAUDID) 4 MG tablet, Take 1 tablet by mouth every 6 (six) hours as needed., Disp: , Rfl:    hydrOXYzine (VISTARIL) 25 MG capsule, Take 1 capsule (25 mg total) by mouth 2 (two) times daily as needed. For sever anxiety attacks, Disp: 60 capsule, Rfl: 1   lisinopril (ZESTRIL) 20 MG tablet, Take 1 tablet (20 mg total) by mouth 1 day or 1 dose., Disp: 90 tablet, Rfl: 1   LORazepam (ATIVAN) 1 MG tablet, Take 1 tablet by mouth at bedtime as needed., Disp: , Rfl:    metFORMIN (GLUCOPHAGE) 1000 MG tablet, Take 1 tablet by mouth 2 (two) times a day., Disp: , Rfl:    mycophenolate (CELLCEPT) 250 MG capsule, Take 1 capsule by mouth 2 (two) times a day., Disp: , Rfl:    promethazine (PHENERGAN)  25 MG tablet, Take 1 tablet by mouth as needed., Disp: , Rfl:    QUEtiapine (SEROQUEL XR) 50 MG TB24 24 hr tablet, Take 1 tablet (50 mg total) by mouth at bedtime., Disp: 30 tablet, Rfl: 1   tacrolimus (PROGRAF) 1 MG capsule, Take 1 capsule (1 mg total) by mouth 1 day or 1 dose., Disp: 30 capsule, Rfl: 0   tiZANidine (ZANAFLEX) 4 MG tablet, Take 1 tablet (4 mg total) by mouth 2 (two) times daily as needed for muscle spasms., Disp: 30 tablet, Rfl: 1   pregabalin (LYRICA) 200 MG capsule, Take 1 capsule (200 mg total) by mouth 3 (three) times daily for 30 days., Disp: 90 capsule, Rfl: 1 No current facility-administered medications for this visit.   Facility-Administered Medications Ordered in Other Visits:    riTUXimab-pvvr (RUXIENCE) 900 mg in sodium chloride 0.9 % 160 mL chemo infusion, , Intravenous, Once, Sindy Guadeloupe, MD  Physical exam:  Vitals:   06/11/19 1117  BP: 108/76  Pulse: 75  Resp: 16  Temp: (!) 97.5 F (36.4 C)  TempSrc: Tympanic  Weight: 247 lb 3.2 oz (112.1 kg)   Physical Exam Constitutional:      General: She is not in acute distress.    Appearance: She is obese.  HENT:     Head: Normocephalic and atraumatic.  Eyes:     Pupils: Pupils are equal, round, and reactive to light.  Neck:     Musculoskeletal: Normal range of motion.  Cardiovascular:     Rate and Rhythm: Normal rate and regular rhythm.     Heart sounds: Normal heart sounds.  Pulmonary:     Effort: Pulmonary effort is normal.     Breath sounds: Normal breath sounds.  Abdominal:     General: Bowel sounds are normal.     Palpations: Abdomen is soft.  Lymphadenopathy:     Comments: Left-sided nodule in the region of parotid gland no longer palpable  Skin:    General: Skin is warm and dry.  Neurological:     Mental Status: She is alert and oriented to person, place, and time.      CMP Latest Ref Rng & Units 06/11/2019  Glucose 70 - 99 mg/dL 162(H)  BUN 6 - 20 mg/dL 17  Creatinine 0.44 - 1.00  mg/dL 0.94  Sodium 135 - 145 mmol/L 139  Potassium 3.5 - 5.1 mmol/L 4.3  Chloride 98 - 111 mmol/L 106  CO2 22 - 32 mmol/L 25  Calcium 8.9 - 10.3 mg/dL 9.3  Total Protein 6.5 - 8.1 g/dL 6.9  Total Bilirubin 0.3 - 1.2 mg/dL 0.6  Alkaline Phos 38 - 126 U/L 60  AST 15 - 41 U/L 20  ALT 0 - 44 U/L 22   CBC Latest Ref Rng & Units 06/11/2019  WBC 4.0 - 10.5 K/uL 6.8  Hemoglobin 12.0 - 15.0 g/dL 13.6  Hematocrit 36.0 - 46.0 % 40.8  Platelets 150 - 400 K/uL 168    No images are attached to the encounter.  US Transvaginal Non-ob  Result Date: 06/01/2019 Patient Name: Alison [name] DOB: 11-06-1975 MRN: [id number] ULTRASOUND REPORT Location: [facility] Date of Service: 06/01/2019 Indications:Abnormal Uterine Bleeding Findings: The uterus is axial and measures 6.0 x 3.1 x 2.5 cm. Echo texture is homogenous with evidence of a focal mass. Within the uterus is a suspected fibroid measuring: Fibroid 1:16.7 x 15.4 x 16.1 mm pedunculated left. The Endometrium measures 3.8 mm. Right Ovary measures 2.2 x 0.9 x 1.2 cm. It is normal in appearance. Left Ovary is not visible. Survey of the adnexa demonstrates no adnexal masses. There is no free fluid in the cul de sac. Impression: 1. Thin endometrium. 2. There is one pedunculated fibroid. 3. Normal right ovary. 4. The left ovary is not visible. Recommendations: 1.Clinical correlation with the patient's History and Physical Exam. Gweneth Dimitri, RT Images reviewed.  Normal GYN study with the exception of a pedunculated 16.7 x 15.4 x 16.57m pedunculated left fibroid.  This could also represent the left ovary which was not visualized.  In the setting of postmenopausal bleeding and endometrial stripe under 469meffectively rules out endometrial hyperplasia or carcinoma.  AnMalachy MoodMD, FALoura PardonB/GYN, CoAngierroup 06/01/2019, 3:18 PM   Ct Bone Marrow Biopsy & Aspiration  Result Date: 05/25/2019 INDICATION: History of lymphoma. Please  perform CT-guided bone marrow biopsy for tissue diagnostic purposes. EXAM: CT-GUIDED BONE MARROW BIOPSY AND ASPIRATION MEDICATIONS: None ANESTHESIA/SEDATION: Fentanyl 100 mcg IV; Versed 4 mg IV Sedation Time: 12 minutes; The patient was continuously monitored during the procedure by the interventional radiology nurse under my direct supervision. COMPLICATIONS: None immediate. PROCEDURE: Informed consent was obtained from the patient following an explanation of the procedure, risks, benefits and alternatives. The patient understands, agrees and consents for the procedure. All questions were addressed. A time out was performed prior to the initiation of the procedure. The patient was positioned prone and non-contrast localization CT was performed of the pelvis to demonstrate the iliac marrow spaces. The operative site was prepped and draped in the usual sterile fashion. Under sterile conditions and local anesthesia, a 22 gauge spinal needle was utilized for procedural planning. Next, an 11 gauge coaxial bone biopsy needle was advanced into the left iliac marrow space. Needle position was confirmed with CT imaging. Initially, bone marrow aspiration was performed. Next, a bone marrow biopsy was obtained with the 11 gauge outer bone marrow device. The 11 gauge coaxial bone biopsy needle was re-advanced into a slightly different location within the left iliac marrow space, positioning was confirmed and an additional bone marrow biopsy was obtained. The needle was removed intact. Hemostasis was obtained with compression and a dressing was  placed. The patient tolerated the procedure well without immediate post procedural complication. IMPRESSION: Successful CT guided left iliac bone marrow aspiration and core biopsy. Electronically Signed   By: Sandi Mariscal M.D.   On: 05/25/2019 10:23     Assessment and plan- Patient is a 43 y.o. 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   He is here for on treatment assessment prior to cycle 3 of weekly Rituxan  Counts okay to proceed with cycle 3 of weekly Rituxan today.  She will directly proceed for cycle 4 next week.  That would be her last Rituxan.  Plan to repeat PET CT scan 4 weeks from now.  Clinically she seems to have responded well and left parotid nodule is no longer palpable.  I will see her in 4 weeks with a CBC with differential, CMP and LDH after her PET scan   Visit Diagnosis 1. 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[place])   2. Visit for monitoring Rituxan therapy      Dr. Randa Evens, MD, MPH [facility] [phone] 06/11/2019 12:14 PM

## 2019-06-17 ENCOUNTER — Other Ambulatory Visit: Payer: Self-pay

## 2019-06-17 ENCOUNTER — Encounter: Payer: Self-pay | Admitting: Family Medicine

## 2019-06-17 ENCOUNTER — Encounter: Payer: Self-pay | Admitting: Student in an Organized Health Care Education/Training Program

## 2019-06-17 ENCOUNTER — Ambulatory Visit: Payer: Medicaid Other | Admitting: Family Medicine

## 2019-06-17 VITALS — BP 124/74 | HR 90 | Temp 97.2°F | Resp 16 | Ht 67.0 in | Wt 250.2 lb

## 2019-06-17 DIAGNOSIS — I10 Essential (primary) hypertension: Secondary | ICD-10-CM

## 2019-06-17 DIAGNOSIS — N950 Postmenopausal bleeding: Secondary | ICD-10-CM | POA: Diagnosis not present

## 2019-06-17 DIAGNOSIS — K429 Umbilical hernia without obstruction or gangrene: Secondary | ICD-10-CM | POA: Insufficient documentation

## 2019-06-17 DIAGNOSIS — E669 Obesity, unspecified: Secondary | ICD-10-CM

## 2019-06-17 DIAGNOSIS — F319 Bipolar disorder, unspecified: Secondary | ICD-10-CM | POA: Diagnosis not present

## 2019-06-17 DIAGNOSIS — M329 Systemic lupus erythematosus, unspecified: Secondary | ICD-10-CM | POA: Diagnosis not present

## 2019-06-17 DIAGNOSIS — Z944 Liver transplant status: Secondary | ICD-10-CM | POA: Diagnosis not present

## 2019-06-17 DIAGNOSIS — R350 Frequency of micturition: Secondary | ICD-10-CM | POA: Diagnosis not present

## 2019-06-17 DIAGNOSIS — M797 Fibromyalgia: Secondary | ICD-10-CM | POA: Diagnosis not present

## 2019-06-17 DIAGNOSIS — N3001 Acute cystitis with hematuria: Secondary | ICD-10-CM

## 2019-06-17 DIAGNOSIS — G500 Trigeminal neuralgia: Secondary | ICD-10-CM

## 2019-06-17 DIAGNOSIS — C884 Extranodal marginal zone B-cell lymphoma of mucosa-associated lymphoid tissue [MALT-lymphoma]: Secondary | ICD-10-CM

## 2019-06-17 DIAGNOSIS — Z1389 Encounter for screening for other disorder: Secondary | ICD-10-CM | POA: Insufficient documentation

## 2019-06-17 DIAGNOSIS — M47816 Spondylosis without myelopathy or radiculopathy, lumbar region: Secondary | ICD-10-CM

## 2019-06-17 DIAGNOSIS — E348 Other specified endocrine disorders: Secondary | ICD-10-CM | POA: Insufficient documentation

## 2019-06-17 DIAGNOSIS — E1165 Type 2 diabetes mellitus with hyperglycemia: Secondary | ICD-10-CM

## 2019-06-17 LAB — POCT URINALYSIS DIPSTICK
Bilirubin, UA: NEGATIVE
Glucose, UA: NEGATIVE
Ketones, UA: NEGATIVE
Nitrite, UA: NEGATIVE
Protein, UA: POSITIVE — AB
Spec Grav, UA: 1.015 (ref 1.010–1.025)
Urobilinogen, UA: 0.2 E.U./dL
pH, UA: 5 (ref 5.0–8.0)

## 2019-06-17 MED ORDER — SULFAMETHOXAZOLE-TRIMETHOPRIM 800-160 MG PO TABS: 1.0000 | ORAL_TABLET | Freq: Two times a day (BID) | ORAL | 0 refills | Status: AC

## 2019-06-17 NOTE — Progress Notes (Signed)
Name: Maciel Kegg   MRN: [id number]    DOB: 1976/08/14   Date:06/17/2019       Progress Note  Subjective  Chief Complaint  Chief Complaint  Patient presents with  . Urinary Tract Infection    urgency, odor,pain    HPI  PT presents for 4 week follow up:  Abnormal Nevi: She has a nevus on the central abdomen that a dermatologist was following prior to her move.  She is a transplant patient and states was seeing dermatology regularly.  We will place referral today.  Trigeminal Neuralgia and Chronic Pain: She has been slowly weaning herself down on her pain medications and is now completely off of opiate medications..  Please see Dr. Elwyn Lade very detailed note regarding her MME of 335 that she was originally on.  She has also seen neurology (Dr. Manuella Ghazi) and was suggested to have nerve conduction study and then possible interventional procedure.  She is looking forward to this treatment.  She does ask about starting Cymbalta which we can consider today.  She is still taking Lyrica 274m TID and has tizanidine PRN.  FMS/SLE/DDD: She was diagnosed with FMS for about 10 years, SLE for about 17 years.  She states she has likely had SLE since age 43 Taking lyrica and doing okay on this, off of all opiate therapies.  She is seeing Dr. LHolley Raringfor pain management,  Bipolar I/PTSD:  Seeing Dr. EShea Evans taking ativan or hydroxyzine PRN, she is also taking seroquel at night.  She feels like her mood has been stable but her sleep is still not quite stable.   UTI: She has been having 1 week of UTI symptoms, foul smelling odor, some decreased output, and dysuria.  She notes no abnormal back pain, abdominal pain, no frank hematuria, N/V.  Abnormal Vaginal Bleeding: She had gone over 12 months without a menses, and has started having vaginal bleeding.  She did see Dr. SGeorgianne Fickand was told her lining was WNL, she is following up in 6 months. No family history ovarian or uterine cancers.  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 RJanese Banks  Post-transplant lymphoma/Liver Transplant hx: She was diagnosed in 2015; had metastasis and it turned into diffuse B-cell lymphoma.  She was ill at age [age]and had a liver transplant at age 43  She has blood work every 3 months, but has never had any rejection issues.  She is waiting on PET scan 04/07/2019, then plan is for biopsy.  She states that her chronic pain is related to chemo-related damage.  She has upcoming appointment with hepatologist.  Marginal B-cell Lymphoma (Stage II Extranodal): Seeing Dr. RJanese Banks  She is doing cycles of Rituxan at this time.  She is doing well and has no concerns from a PCP perspective at this time.   Umbilical Hernia: She notes multiple repairs in the past, she denies tenderness, redness, change in color or drainage.  Does not want to see anyone right now for this.  HTN: Taking lisinopril 271mdaily; usually runs 120's/70's at home. No chest pain, shortness of breath, BLE edema, or headaches.  Unchanged today, stable and at goal.   Diabetes mellitus type 2: LAst A1C in July by Endocrinology and at 6.4%.  Seeing [place] NP-C.  Does have some increased urinary frequency due to UTI; has Sjogren's and has increased thirst likely due to this.  Denies polyphagia.  Taking metformin.    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 Followed by Endocrinology for this.   Obesity: She is limiting portions, eating balanced diet; she is swimming more often lately as this is easiest on her joint.    Patient Active Problem List   Diagnosis Date Noted  . Chronic hip pain, left 06/02/2019  . Lumbar spondylosis 06/02/2019  . Mouth dryness 06/02/2019  . Obesity (BMI 35.0-39.9 without comorbidity) 06/02/2019  . Goals of care, counseling/discussion 05/18/2019  . 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[place]) 05/18/2019  . Occipital neuralgia 05/13/2019  . Plantar fasciitis of left foot 04/27/2019  . Fibromyalgia 03/23/2019  . Systemic lupus erythematosus (SLE) in adult ([place]) 03/23/2019  . Essential hypertension 03/23/2019  . Hepatitis 03/23/2019  . Mass of left kidney 03/23/2019  . Diabetes mellitus ([place]) 03/23/2019  . Nausea without vomiting 03/23/2019  . Neuropathy 03/23/2019  . Cancer ([place]) 03/23/2019  . Hyperlipidemia 03/23/2019  . Osteoarthritis 03/23/2019  . Trigeminal neuralgia 03/23/2019  . Chronic renal disease, stage III ([place]) 03/23/2019  . Nephrolithiasis 03/23/2019  . Migraine 03/23/2019  . OSA on CPAP 03/23/2019  . Fibrocystic breast 03/23/2019  . Heart murmur 03/23/2019  . Bipolar disorder (manic depression) ([place]) 07/02/2017  . Abnormal uterine bleeding 03/18/2017  . Major depressive disorder, recurrent ([place]) 03/18/2017  . Morbid obesity ([place]) 03/18/2017  . Secondary hyperparathyroidism ([place]) 02/07/2017  . Neuropathic pain 11/19/2016  . Post herpetic neuralgia 11/19/2016  . 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[place]) 01/13/2015  . Lumbar disc disease with radiculopathy 07/26/2013  . S/P liver transplant ([place]) 07/26/2013  . Type 2 diabetes mellitus, uncontrolled ([place]) 07/26/2013  . Facial nerve disorder 06/29/2012  . Low back pain 12/26/2010    Past Surgical History:  Procedure Laterality Date  . BONE MARROW BIOPSY  01/13/2015  . BREAST BIOPSY  12/2014  . BREAST BIOPSY  2011  . BREAST SURGERY    . CHOLECYSTECTOMY    . HERNIA REPAIR    . infusaport    . LIVER TRANSPLANT  12/17/1991  . LUMBAR PUNCTURE    . PORT A CATH INJECTION ([facility])    . tumor removal  2015    Family History  Problem Relation Age of Onset  . Heart disease Mother   . Hypertension Mother   . Cancer - Other Mother   . Bipolar disorder Mother   . Heart disease Father   . Hypertension  Father   . Diabetes Father   . Parkinson's disease Maternal Grandmother   . Cancer Maternal Aunt   . Cancer Maternal Uncle   . Cancer Maternal Grandfather   . Lupus Paternal Grandmother   . Hypertension Brother     Social History   Socioeconomic History  . Marital status: Single    Spouse name: Not on file  . Number of children: 0  . Years of education: 9  . Highest education level: GED or equivalent  Occupational History  . Occupation: disabled  Social Needs  . Financial resource strain: Somewhat hard  . Food insecurity    Worry: Sometimes true    Inability: Sometimes true  . Transportation needs    Medical: Yes    Non-medical: Yes  Tobacco Use  . Smoking status: Never Smoker  . Smokeless tobacco: Never Used  Substance and Sexual Activity  . Alcohol use: Not Currently    Frequency:  Never  . Drug use: Not Currently    Types: Marijuana    Comment: pain managment last used in early april  . Sexual activity: Not Currently  Lifestyle  . Physical activity    Days per week: 1 day    Minutes per session: 30 min  . Stress: Rather much  Relationships  . Social connections    Talks on phone: Once a week    Gets together: Three times a week    Attends religious service: More than 4 times per year    Active member of club or organization: No    Attends meetings of clubs or organizations: Never    Relationship status: Never married  . Intimate partner violence    Fear of current or ex partner: No    Emotionally abused: No    Physically abused: No    Forced sexual activity: No  Other Topics Concern  . Not on file  Social History Narrative  . Not on file     Current Outpatient Medications:  .  baclofen (LIORESAL) 20 MG tablet, Take 1 tablet (20 mg total) by mouth 3 (three) times daily., Disp: 30 each, Rfl: 2 .  Cranberry 500 MG TABS, Take by mouth., Disp: , Rfl:  .  hydrOXYzine (VISTARIL) 25 MG capsule, Take 1 capsule (25 mg total) by mouth 2 (two) times daily as  needed. For sever anxiety attacks, Disp: 60 capsule, Rfl: 1 .  lisinopril (ZESTRIL) 20 MG tablet, Take 1 tablet (20 mg total) by mouth 1 day or 1 dose., Disp: 90 tablet, Rfl: 1 .  LORazepam (ATIVAN) 1 MG tablet, Take 1 tablet by mouth at bedtime as needed., Disp: , Rfl:  .  metFORMIN (GLUCOPHAGE) 1000 MG tablet, Take 1 tablet by mouth 2 (two) times a day., Disp: , Rfl:  .  mycophenolate (CELLCEPT) 250 MG capsule, Take 1 capsule by mouth 2 (two) times a day., Disp: , Rfl:  .  promethazine (PHENERGAN) 25 MG tablet, Take 1 tablet by mouth as needed., Disp: , Rfl:  .  QUEtiapine (SEROQUEL XR) 50 MG TB24 24 hr tablet, Take 1 tablet (50 mg total) by mouth at bedtime., Disp: 30 tablet, Rfl: 1 .  tacrolimus (PROGRAF) 1 MG capsule, Take 1 capsule (1 mg total) by mouth 1 day or 1 dose., Disp: 30 capsule, Rfl: 0 .  tiZANidine (ZANAFLEX) 4 MG tablet, Take 1 tablet (4 mg total) by mouth 2 (two) times daily as needed for muscle spasms., Disp: 30 tablet, Rfl: 1 .  HYDROmorphone (DILAUDID) 4 MG tablet, Take 1 tablet by mouth every 6 (six) hours as needed., Disp: , Rfl:  .  pregabalin (LYRICA) 200 MG capsule, Take 1 capsule (200 mg total) by mouth 3 (three) times daily for 30 days., Disp: 90 capsule, Rfl: 1  Allergies  Allergen Reactions  . Carbamazepine Other (See Comments)    Medication interaction-prograf  . Hydrocodone-Acetaminophen Itching  . Naproxen Itching  . Penicillin G Itching and Rash    I personally reviewed active problem list, medication list, allergies, health maintenance, notes from last encounter, lab results with the patient/caregiver today.   ROS  Constitutional: Negative for fever or weight change.  Respiratory: Negative for cough and shortness of breath.   Cardiovascular: Negative for chest pain or palpitations.  Gastrointestinal: Negative for abdominal pain, no bowel changes.  Musculoskeletal: Negative for gait problem or joint swelling.  Skin: Negative for rash.  Neurological:  Negative for dizziness or headache.  No other specific  complaints in a complete review of systems (except as listed in HPI above).  Objective  Vitals:   06/17/19 1035  BP: 124/74  Pulse: 90  Resp: 16  Temp: (!) 97.2 F (36.2 C)  TempSrc: Oral  SpO2: 93%  Weight: 250 lb 3.2 oz (113.5 kg)  Height: _0  (1.702 m)    Body mass index is 39.19 kg/m.  Physical Exam  Constitutional: Patient appears well-developed and well-nourished. No distress.  HENT: Head: Normocephalic and atraumatic.  Eyes: Conjunctivae and EOM are normal. No scleral icterus.   Neck: Normal range of motion. Neck supple. No JVD present. No thyromegaly present.  Cardiovascular: Normal rate, regular rhythm and normal heart sounds.  No murmur heard. No BLE edema. Pulmonary/Chest: Effort normal and breath sounds normal. No respiratory distress. Musculoskeletal: Normal range of motion, no joint effusions. No gross deformities Neurological: Pt is alert and oriented to person, place, and time. No cranial nerve deficit. Coordination, balance, strength, speech and gait are normal.  Skin: Skin is warm and dry. No rash noted. No erythema. There is a pink round raised lesion to the central upper abdomen. Psychiatric: Patient has a normal mood and affect. behavior is normal. Judgment and thought content normal.  Results for orders placed or performed in visit on 06/17/19 (from the past 72 hour(s))  POCT urinalysis dipstick     Status: Abnormal   Collection Time: 06/17/19 10:52 AM  Result Value Ref Range   Color, UA yellow    Clarity, UA cloudy    Glucose, UA Negative Negative   Bilirubin, UA negative    Ketones, UA negative    Spec Grav, UA 1.015 1.010 - 1.025   Blood, UA moderate    pH, UA 5.0 5.0 - 8.0   Protein, UA Positive (A) Negative   Urobilinogen, UA 0.2 0.2 or 1.0 E.U./dL   Nitrite, UA negative    Leukocytes, UA Large (3+) (A) Negative   Appearance cloudy    Odor none     PHQ2/9: Depression screen [facility]  2/9 06/17/2019 05/20/2019 04/05/2019  Decreased Interest _1 Down, Depressed, Hopeless _2 PHQ - 2 Score _3 Altered sleeping _4 Tired, decreased energy _5 Change in appetite _6 Feeling bad or failure about yourself  2 0 2  Trouble concentrating _7 Moving slowly or fidgety/restless 2 0 1  Suicidal thoughts 0 0 0  PHQ-9 Score _8 Difficult doing work/chores Somewhat difficult Somewhat difficult Somewhat difficult   PHQ-2/9 Result is positive.    Fall Risk: Fall Risk  06/17/2019 05/20/2019 04/05/2019  Falls in the past year? _9 Number falls in past yr: _10 Injury with Fall? 0 0 0  Risk for fall due to : - History of fall(s) -  Follow up Falls evaluation completed Falls evaluation completed -   Assessment & Plan  1. Encounter for surveillance of abnormal nevi - Ambulatory referral to Dermatology  2. Frequency of urination - POCT urinalysis dipstick - sulfamethoxazole-trimethoprim (BACTRIM DS) 800-160 MG tablet; Take 1 tablet by mouth 2 (two) times daily for 3 days.  Dispense: 6 tablet; Refill: 0  3. Acute cystitis with hematuria - sulfamethoxazole-trimethoprim (BACTRIM DS) 800-160 MG tablet; Take 1 tablet by mouth 2 (two) times daily for 3 days.  Dispense: 6 tablet; Refill: 0  4. Trigeminal neuralgia - Seeing neurology  5. Fibromyalgia - Following  up with pain management; encouragerd aquatic exercises as tolerated as she does have access to this and it is gently on her joints.  She is off of opiate therapy and is congratulated on this; she is taking muscle relaxer PRN.  6. Systemic lupus erythematosus (SLE) in adult [facility]) - Seeing rheumatology and pain management.  7. Lumbar spondylosis - Seeing pain management  8. Bipolar affective disorder, remission status unspecified ([place]) - Seeing psychiatry and doing ok - medication changes made recently and she is having some depressive symptoms still.  9. Abnormal vaginal bleeding in  postmenopausal patient - Following with gyn for now, though initial eval was reassuring.  10. S/P liver transplant ([place]) - Has upcoming appt with hepatology; seeing oncology - Ambulatory referral to Dermatology  11. 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 Umbilical hernia without obstruction and without gangrene - She does not want surgical eval at this time; reviewed incarceration signs and symptoms.  13. Essential hypertension - Stable today; DASH diet   14. Type 2 diabetes mellitus with hyperglycemia, without long-term current use of insulin ([place]) - seeing endocrinology; stable in July at last A1C  15. Pineal gland cyst - Seeing endocrinology  16. Obesity (BMI 35.0-39.9 without comorbidity) - Discussed importance of 150 minutes of physical activity weekly, eat two servings of fish weekly, eat one serving of tree nuts ( cashews, pistachios, pecans, almonds.[name]) every other day, eat 6 servings of fruit/vegetables daily and drink plenty of water and avoid sweet beverages.

## 2019-06-17 NOTE — Progress Notes (Deleted)
yellow

## 2019-06-18 ENCOUNTER — Other Ambulatory Visit: Payer: Self-pay

## 2019-06-18 ENCOUNTER — Inpatient Hospital Stay: Payer: Medicaid Other

## 2019-06-18 VITALS — BP 109/78 | HR 87 | Temp 97.8°F | Resp 18 | Wt 250.4 lb

## 2019-06-18 DIAGNOSIS — C8580 Other specified types of non-Hodgkin lymphoma, unspecified site: Secondary | ICD-10-CM

## 2019-06-18 DIAGNOSIS — Z5181 Encounter for therapeutic drug level monitoring: Secondary | ICD-10-CM

## 2019-06-18 DIAGNOSIS — Z5112 Encounter for antineoplastic immunotherapy: Secondary | ICD-10-CM | POA: Diagnosis not present

## 2019-06-18 DIAGNOSIS — C884 Extranodal marginal zone B-cell lymphoma of mucosa-associated lymphoid tissue [MALT-lymphoma]: Secondary | ICD-10-CM

## 2019-06-18 LAB — COMPREHENSIVE METABOLIC PANEL
ALT: 22 U/L (ref 0–44)
AST: 23 U/L (ref 15–41)
Albumin: 3.8 g/dL (ref 3.5–5.0)
Alkaline Phosphatase: 51 U/L (ref 38–126)
Anion gap: 11 (ref 5–15)
BUN: 24 mg/dL — ABNORMAL HIGH (ref 6–20)
CO2: 22 mmol/L (ref 22–32)
Calcium: 8.7 mg/dL — ABNORMAL LOW (ref 8.9–10.3)
Chloride: 104 mmol/L (ref 98–111)
Creatinine, Ser: 0.84 mg/dL (ref 0.44–1.00)
GFR calc Af Amer: >60 mL/min (ref >60)
GFR calc non Af Amer: >60 mL/min (ref >60)
Glucose, Bld: 145 mg/dL — ABNORMAL HIGH (ref 70–99)
Potassium: 4.1 mmol/L (ref 3.5–5.1)
Sodium: 137 mmol/L (ref 135–145)
Total Bilirubin: 0.6 mg/dL (ref 0.3–1.2)
Total Protein: 6.4 g/dL — ABNORMAL LOW (ref 6.5–8.1)

## 2019-06-18 LAB — CBC WITH DIFFERENTIAL/PLATELET
Abs Immature Granulocytes: 0.03 10*3/uL (ref 0.00–0.07)
Basophils Absolute: 0 10*3/uL (ref 0.0–0.1)
Basophils Relative: 1 %
Eosinophils Absolute: 0.4 10*3/uL (ref 0.0–0.5)
Eosinophils Relative: 7 %
HCT: 36.4 % (ref 36.0–46.0)
Hemoglobin: 12.1 g/dL (ref 12.0–15.0)
Immature Granulocytes: 1 %
Lymphocytes Relative: 29 %
Lymphs Abs: 1.6 10*3/uL (ref 0.7–4.0)
MCH: 28.9 pg (ref 26.0–34.0)
MCHC: 33.2 g/dL (ref 30.0–36.0)
MCV: 86.9 fL (ref 80.0–100.0)
Monocytes Absolute: 0.6 10*3/uL (ref 0.1–1.0)
Monocytes Relative: 11 %
Neutro Abs: 3 10*3/uL (ref 1.7–7.7)
Neutrophils Relative %: 51 %
Platelets: 135 10*3/uL — ABNORMAL LOW (ref 150–400)
RBC: 4.19 MIL/uL (ref 3.87–5.11)
RDW: 13.4 % (ref 11.5–15.5)
WBC: 5.7 10*3/uL (ref 4.0–10.5)
nRBC: 0 % (ref 0.0–0.2)

## 2019-06-18 MED ORDER — SODIUM CHLORIDE 0.9 % IV SOLN
Freq: Once | INTRAVENOUS | Status: AC
  Administered 2019-06-18: 11:00:00 via INTRAVENOUS
  Filled 2019-06-18: qty 250

## 2019-06-18 MED ORDER — SODIUM CHLORIDE 0.9 % IV SOLN
Freq: Once | INTRAVENOUS | Status: AC
  Administered 2019-06-18: 12:00:00 via INTRAVENOUS
  Filled 2019-06-18: qty 160

## 2019-06-18 MED ORDER — DIPHENHYDRAMINE HCL 25 MG PO CAPS
50.0000 mg | ORAL_CAPSULE | Freq: Once | ORAL | Status: AC
  Administered 2019-06-18: 11:00:00 50 mg via ORAL
  Filled 2019-06-18: qty 2

## 2019-06-18 MED ORDER — HEPARIN SOD (PORK) LOCK FLUSH 100 UNIT/ML IV SOLN
500.0000 [IU] | Freq: Once | INTRAVENOUS | Status: AC | PRN
  Administered 2019-06-18: 14:00:00 500 [IU]
  Filled 2019-06-18: qty 5

## 2019-06-18 MED ORDER — ACETAMINOPHEN 325 MG PO TABS
650.0000 mg | ORAL_TABLET | Freq: Once | ORAL | Status: AC
  Administered 2019-06-18: 11:00:00 650 mg via ORAL
  Filled 2019-06-18: qty 2

## 2019-06-22 ENCOUNTER — Telehealth: Payer: Self-pay | Admitting: Student in an Organized Health Care Education/Training Program

## 2019-06-22 NOTE — Telephone Encounter (Signed)
Patient scheduled a VV on morning of procedure day. I called and lvmail stating we need to change her appt. Also we need to verify if she has completed psych eval with Dr. Shea Evans.

## 2019-06-24 ENCOUNTER — Encounter: Payer: Self-pay | Admitting: Family Medicine

## 2019-06-25 ENCOUNTER — Encounter: Payer: Self-pay | Admitting: Family Medicine

## 2019-06-25 ENCOUNTER — Encounter: Payer: Self-pay | Admitting: Student in an Organized Health Care Education/Training Program

## 2019-06-25 DIAGNOSIS — M797 Fibromyalgia: Secondary | ICD-10-CM

## 2019-06-25 DIAGNOSIS — M329 Systemic lupus erythematosus, unspecified: Secondary | ICD-10-CM

## 2019-06-25 MED ORDER — PREGABALIN 200 MG PO CAPS: 200.0000 mg | ORAL_CAPSULE | Freq: Three times a day (TID) | ORAL | 0 refills | Status: DC

## 2019-06-29 DIAGNOSIS — R200 Anesthesia of skin: Secondary | ICD-10-CM | POA: Diagnosis not present

## 2019-06-29 DIAGNOSIS — R202 Paresthesia of skin: Secondary | ICD-10-CM | POA: Diagnosis not present

## 2019-06-30 ENCOUNTER — Encounter: Payer: Self-pay | Admitting: Student in an Organized Health Care Education/Training Program

## 2019-07-04 ENCOUNTER — Other Ambulatory Visit: Payer: Self-pay | Admitting: Family Medicine

## 2019-07-04 DIAGNOSIS — K759 Inflammatory liver disease, unspecified: Secondary | ICD-10-CM

## 2019-07-04 DIAGNOSIS — Z944 Liver transplant status: Secondary | ICD-10-CM

## 2019-07-05 ENCOUNTER — Encounter: Payer: Self-pay | Admitting: Family Medicine

## 2019-07-05 ENCOUNTER — Encounter: Payer: Self-pay | Admitting: Student in an Organized Health Care Education/Training Program

## 2019-07-05 ENCOUNTER — Ambulatory Visit
Payer: Medicaid Other | Attending: Student in an Organized Health Care Education/Training Program | Admitting: Student in an Organized Health Care Education/Training Program

## 2019-07-05 ENCOUNTER — Other Ambulatory Visit: Payer: Self-pay

## 2019-07-05 DIAGNOSIS — Z7984 Long term (current) use of oral hypoglycemic drugs: Secondary | ICD-10-CM | POA: Diagnosis not present

## 2019-07-05 DIAGNOSIS — Z87448 Personal history of other diseases of urinary system: Secondary | ICD-10-CM | POA: Diagnosis not present

## 2019-07-05 DIAGNOSIS — M329 Systemic lupus erythematosus, unspecified: Secondary | ICD-10-CM

## 2019-07-05 DIAGNOSIS — N183 Chronic kidney disease, stage 3 (moderate): Secondary | ICD-10-CM | POA: Diagnosis not present

## 2019-07-05 DIAGNOSIS — Z23 Encounter for immunization: Secondary | ICD-10-CM | POA: Diagnosis not present

## 2019-07-05 DIAGNOSIS — Z944 Liver transplant status: Secondary | ICD-10-CM | POA: Diagnosis not present

## 2019-07-05 DIAGNOSIS — I129 Hypertensive chronic kidney disease with stage 1 through stage 4 chronic kidney disease, or unspecified chronic kidney disease: Secondary | ICD-10-CM | POA: Diagnosis not present

## 2019-07-05 DIAGNOSIS — E785 Hyperlipidemia, unspecified: Secondary | ICD-10-CM | POA: Diagnosis not present

## 2019-07-05 DIAGNOSIS — E1122 Type 2 diabetes mellitus with diabetic chronic kidney disease: Secondary | ICD-10-CM | POA: Diagnosis not present

## 2019-07-05 DIAGNOSIS — M797 Fibromyalgia: Secondary | ICD-10-CM | POA: Diagnosis not present

## 2019-07-05 DIAGNOSIS — Z4823 Encounter for aftercare following liver transplant: Secondary | ICD-10-CM | POA: Diagnosis not present

## 2019-07-05 DIAGNOSIS — C8330 Diffuse large B-cell lymphoma, unspecified site: Secondary | ICD-10-CM | POA: Diagnosis not present

## 2019-07-05 DIAGNOSIS — Z79899 Other long term (current) drug therapy: Secondary | ICD-10-CM | POA: Diagnosis not present

## 2019-07-05 DIAGNOSIS — D899 Disorder involving the immune mechanism, unspecified: Secondary | ICD-10-CM | POA: Diagnosis not present

## 2019-07-05 MED ORDER — TIZANIDINE HCL 4 MG PO TABS: 4.0000 mg | ORAL_TABLET | Freq: Two times a day (BID) | ORAL | 2 refills | Status: DC | PRN

## 2019-07-05 MED ORDER — PREGABALIN 200 MG PO CAPS: 200.0000 mg | ORAL_CAPSULE | Freq: Three times a day (TID) | ORAL | 2 refills | Status: DC

## 2019-07-05 MED ORDER — BACLOFEN 20 MG PO TABS: 20.0000 mg | ORAL_TABLET | Freq: Three times a day (TID) | ORAL | 2 refills | Status: DC

## 2019-07-05 NOTE — Telephone Encounter (Signed)
Requested medication (s) are due for refill today: yes  Requested medication (s) are on the active medication list: yes  Last refill:  06/08/2019  Future visit scheduled: no  Notes to clinic:  Refill cannot be delegated    Requested Prescriptions  Pending Prescriptions Disp Refills   tacrolimus (PROGRAF) 1 MG capsule [Pharmacy Med Name: Tacrolimus 1 MG Oral Capsule] 30 capsule 0    Sig: Take 1 capsule by mouth once daily     Not Delegated - Immunology: Immunosuppressive Agents - tacrolimus Failed - 07/04/2019 11:21 AM      Failed - This refill cannot be delegated      Failed - HDL in normal range and within 360 days    No results found for: HDL       Failed - LDL in normal range and within 360 days    No results found for: LDLCALC, LDLC, HIRISKLDL       Failed - PLT in normal range and within 90 days    Platelets  Date Value Ref Range Status  06/18/2019 135 (L) 150 - 400 K/uL Final         Failed - Tacrolimus in normal range and within 90 days    No results found for: TACROLIMUS       Failed - Total Cholesterol in normal range and within 360 days    No results found for: CHOL, POCCHOL       Failed - Triglycerides in normal range and within 360 days    No results found for: TRIG       Failed - Protein (Urine) in normal range and within 90 days    Protein, ur  Date Value Ref Range Status  05/20/2019 2+ (A) NEGATIVE Final   Protein, UA  Date Value Ref Range Status  06/17/2019 Positive (A) Negative Final         Passed - ALT in normal range and within 90 days    ALT  Date Value Ref Range Status  06/18/2019 22 0 - 44 U/L Final         Passed - AST in normal range and within 90 days    AST  Date Value Ref Range Status  06/18/2019 23 15 - 41 U/L Final         Passed - Cr in normal range and within 90 days    Creatinine, Ser  Date Value Ref Range Status  06/18/2019 0.84 0.44 - 1.00 mg/dL Final         Passed - HCT in normal range and within 90 days    HCT   Date Value Ref Range Status  06/18/2019 36.4 36.0 - 46.0 % Final         Passed - HGB in normal range and within 90 days    Hemoglobin  Date Value Ref Range Status  06/18/2019 12.1 12.0 - 15.0 g/dL Final         Passed - K in normal range and within 90 days    Potassium  Date Value Ref Range Status  06/18/2019 4.1 3.5 - 5.1 mmol/L Final         Passed - WBC in normal range and within 90 days    WBC  Date Value Ref Range Status  06/18/2019 5.7 4.0 - 10.5 K/uL Final         Passed - Valid encounter within last 3 months    Recent Outpatient Visits  2 weeks ago Frequency of urination   [place], [place]   1 month ago Abnormal vaginal bleeding in postmenopausal patient   [place], FNP   3 months ago [place], [place]      Future Appointments            Today [name], MD [place]   In 2 months [place], [facility], [facility]

## 2019-07-05 NOTE — Progress Notes (Signed)
Pain Management Virtual Encounter Note - Virtual Visit via [place] (real-time audio visits between healthcare provider and patient).   Patient's Phone No. & Preferred Pharmacy:  [phone] (home); [phone] (mobile); (Preferred) [phone] [email or web address]  [place], [place] - [address] [address] Phone: [phone] Fax: [phone]    Pre-screening note:  Our staff contacted Alison [name] and offered her an "in person", "face-to-face" appointment versus a telephone encounter. She indicated preferring the telephone encounter, at this time.   Reason for Virtual Visit: COVID-19*  Social distancing based on CDC and AMA recommendations.   I contacted Alison [name] on 07/05/2019 via video conference.      I clearly identified myself as [name], MD. I verified that I was speaking with the correct person using two identifiers (Name: Alison [name], and date of birth: [birth year]).  Advanced Informed Consent I sought verbal advanced consent from Alison [name] for virtual visit interactions. I informed Alison [name] of possible security and privacy concerns, risks, and limitations associated with providing "not-in-person" medical evaluation and management services. I also informed Alison [name] of the availability of "in-person" appointments. Finally, I informed her that there would be a charge for the virtual visit and that she could be  personally, fully or partially, financially responsible for it. Alison [name] expressed understanding and agreed to proceed.   Historic Elements   Alison [name] is a 43 y.o. year old, female patient evaluated today after her last encounter by our practice on 06/22/2019. Alison [name]  has a past medical history of Abnormal uterine bleeding, Allergy, Anxiety, Arthritis, Bipolar disorder (manic depression) ([place]), Chronic kidney failure, Chronic renal disease, stage III ([place]),  Diabetes mellitus without complication ([place]), 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[place]), FH: trigeminal neuralgia, GERD (gastroesophageal reflux disease), Heart murmur, Hypertension, Kidney mass, Lupus ([place]), Lupus ([place]), lymphoma, Lymphoma ([place]), Lymphoma ([place]), Major depressive disorder, Migraine, Morbid obesity ([place]), Neuromuscular disorder ([place]), Neuropathy, Personality disorder ([place]), Post herpetic neuralgia, PTSD (post-traumatic stress disorder), Renal disorder, and S/P liver transplant ([place]). She also  has a past surgical history that includes Liver transplant (12/17/1991); Hernia repair; tumor removal (2015); infusaport; Lumbar puncture; Bone marrow biopsy (01/13/2015); Breast biopsy (12/2014); Breast biopsy (2011); Cholecystectomy; Breast surgery; and PORT A CATH INJECTION ([place] [facility]). Alison [name] has a current medication list which includes the following prescription(s): baclofen, cranberry, hydroxyzine, lisinopril, lorazepam, metformin, mycophenolate, pregabalin, promethazine, quetiapine, tacrolimus, and tizanidine. She  reports that she has never smoked. She has never used smokeless tobacco. She reports previous alcohol use. She reports previous drug use. Drug: Marijuana. Ms. [place] is allergic to carbamazepine; hydrocodone-acetaminophen; naproxen; and penicillin g.   HPI  Patient continues to endorse significant pain in her low back, bilateral hips as well as polyarthralgias and polymyalgias related to her fibromyalgia.  Patient feels that her fibromyalgia pain is flaring up.  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  She is completely weaned off her opioid medications and is currently not taking any.  She states that she feels  better off without this medication even though she endorses that she is in pain.  She understands that from a cognitive standpoint she is more lucid by being off such high-dose chronic opioid therapy.  Patient  has seen psychiatry as well, Dr. Shea Evans.  Patient's bipolar disorder is unstable she was recommended to start Seroquel 50 mg at night.  Patient's PTSD is also not optimized and she has been referred for CBT and hydroxyzine was started.  Please refer to my initial clinic note on 05/18/2019.   UDS: No results found for: SUMMARY Laboratory Chemistry Profile (12 mo)  Renal: 06/18/2019: BUN 24; Creatinine, Ser 0.84  Lab Results  Component Value Date   GFRAA >60 06/18/2019   GFRNONAA >60 06/18/2019   Hepatic: 06/18/2019: Albumin 3.8 Lab Results  Component Value Date   AST 23 06/18/2019   ALT 22 06/18/2019   Other: No results found for requested labs within last 8760 hours. Note: Above Lab results reviewed.  Imaging  Last 90 days:  US Soft Tissue Head & Neck (non-thyroid)  Result Date: 05/04/2019 CLINICAL DATA:  History of lymphoma now with hypermetabolic bilateral parotid nodules. EXAM: ULTRASOUND OF HEAD/NECK SOFT TISSUES TECHNIQUE: Ultrasound examination of the head and neck soft tissues was performed in the area of clinical concern. COMPARISON:  PET-CT - 04/07/2019 FINDINGS: Sonographic evaluation of the left parotid gland demonstrates a well-defined approximately 1.4 x 1.3 x 0.8 cm hypoechoic suspected lymph node likely within the peripheral aspect the left parotid gland. Sonographic evaluation of the right parotid gland demonstrates a similar appearing approximately 1.2 x 1.1 x 0.7 cm suspected lymph node also likely within the peripheral aspect of the right parotid gland. IMPRESSION: Sonographic evaluation demonstrates prominent though non pathologically enlarged bilateral parotid gland lymph nodes. Patient subsequent underwent ultrasound-guided biopsy of both of the parotid gland  nodules/lymph nodes. Electronically Signed   By: Sandi Mariscal M.D.   On: 05/04/2019 11:58   US Transvaginal Non-ob  Result Date: 06/01/2019 Patient Name: Alison [name] DOB: Feb 14, 1976 MRN: [id number] ULTRASOUND REPORT Location: [facility] Date of Service: 06/01/2019 Indications:Abnormal Uterine Bleeding Findings: The uterus is axial and measures 6.0 x 3.1 x 2.5 cm. Echo texture is homogenous with evidence of a focal mass. Within the uterus is a suspected fibroid measuring: Fibroid 1:16.7 x 15.4 x 16.1 mm pedunculated left. The Endometrium measures 3.8 mm. Right Ovary measures 2.2 x 0.9 x 1.2 cm. It is normal in appearance. Left Ovary is not visible. Survey of the adnexa demonstrates no adnexal masses. There is no free fluid in the cul de sac. Impression: 1. Thin endometrium. 2. There is one pedunculated fibroid. 3. Normal right ovary. 4. The left ovary is not visible. Recommendations: 1.Clinical correlation with the patient's History and Physical Exam. Gweneth Dimitri, RT Images reviewed.  Normal GYN study with the exception of a pedunculated 16.7 x 15.4 x 16.51m pedunculated left fibroid.  This could also represent the left ovary which was not visualized.  In the setting of postmenopausal bleeding and endometrial stripe under 421meffectively rules out endometrial hyperplasia or carcinoma.  AnMalachy MoodMD, FAKendrickB/GYN, [facility] 06/01/2019, 3:18 PM   Nm Pet Image Restage (ps) Whole Body  Result Date: 04/07/2019 CLINICAL DATA:  Initial treatment strategy for lymphoma. Liver transplant. Parotid lesions on outside CT scan. EXAM: NUCLEAR MEDICINE PET WHOLE BODY TECHNIQUE: 12.8 mCi F-18 FDG was injected intravenously. Full-ring PET imaging was performed from the skull base to thigh after the radiotracer. CT data was obtained and used for attenuation correction and anatomic localization. Fasting blood glucose: 124 mg/dl COMPARISON:  None available FINDINGS: Mediastinal blood pool  activity: SUV max 3.94 HEAD/NECK: Single hypermetabolic nodules within the LEFT and RIGHT  parotid gland. Lesion in the LEFT parotid gland measures 1.1 cm with SUV max equal 6.3. Lesion in the RIGHT parotid gland measures 0.8 cm with SUV max equals 5.5. These rounded lesions are higher density than the background parotid tissue and are typical of primary parotid neoplasms. Mild asymmetry metabolic activity on the LEFT hypopharynx compared to the RIGHT. No hypermetabolic lymph nodes in the neck. Incidental CT findings: none CHEST: No hypermetabolic mediastinal or hilar nodes. No suspicious pulmonary nodules on the CT scan. Incidental CT findings: Port in the anterior chest wall with tip in distal SVC. ABDOMEN/PELVIS: No abnormal hypermetabolic activity within the liver, pancreas, adrenal glands, or spleen. No hypermetabolic lymph nodes in the abdomen or pelvis. Normal volume spleen.  Postcholecystectomy.  Uterus normal. Incidental CT findings: none SKELETON: No focal hypermetabolic activity to suggest skeletal metastasis. Incidental CT findings: none EXTREMITIES: No abnormal hypermetabolic activity in the lower extremities. Incidental CT findings: none IMPRESSION: 1. Single bilateral small round hypermetabolic nodular lesions in the LEFT and RIGHT parotid gland. Favor primary parotid neoplasms over lymphoma. Recommend ENT consultation. 2. No evidence of hypermetabolic adenopathy on whole-body scan. 3. Normal spleen and bone marrow. Electronically Signed   By: Suzy Bouchard M.D.   On: 04/07/2019 16:31   Ct Bone Marrow Biopsy & Aspiration  Result Date: 05/25/2019 INDICATION: History of lymphoma. Please perform CT-guided bone marrow biopsy for tissue diagnostic purposes. EXAM: CT-GUIDED BONE MARROW BIOPSY AND ASPIRATION MEDICATIONS: None ANESTHESIA/SEDATION: Fentanyl 100 mcg IV; Versed 4 mg IV Sedation Time: 12 minutes; The patient was continuously monitored during the procedure by the interventional radiology  nurse under my direct supervision. COMPLICATIONS: None immediate. PROCEDURE: Informed consent was obtained from the patient following an explanation of the procedure, risks, benefits and alternatives. The patient understands, agrees and consents for the procedure. All questions were addressed. A time out was performed prior to the initiation of the procedure. The patient was positioned prone and non-contrast localization CT was performed of the pelvis to demonstrate the iliac marrow spaces. The operative site was prepped and draped in the usual sterile fashion. Under sterile conditions and local anesthesia, a 22 gauge spinal needle was utilized for procedural planning. Next, an 11 gauge coaxial bone biopsy needle was advanced into the left iliac marrow space. Needle position was confirmed with CT imaging. Initially, bone marrow aspiration was performed. Next, a bone marrow biopsy was obtained with the 11 gauge outer bone marrow device. The 11 gauge coaxial bone biopsy needle was re-advanced into a slightly different location within the left iliac marrow space, positioning was confirmed and an additional bone marrow biopsy was obtained. The needle was removed intact. Hemostasis was obtained with compression and a dressing was placed. The patient tolerated the procedure well without immediate post procedural complication. IMPRESSION: Successful CT guided left iliac bone marrow aspiration and core biopsy. Electronically Signed   By: Sandi Mariscal M.D.   On: 05/25/2019 10:23   [place] Core Biopsy (salivary Gland/parotid Gland)  Result Date: 05/04/2019 INDICATION: History of lymphoma, now with indeterminate bilateral hypermetabolic parotid gland nodules/lymph nodes. Please perform ultrasound-guided biopsy for tissue diagnostic purposes. EXAM: 1. ULTRASOUND-GUIDED LEFT PAROTID GLAND NODULE/LYMPH NODE BIOPSY 2. ULTRASOUND-GUIDED RIGHT PAROTID GLAND NODULE/LYMPH NODE BIOPSY COMPARISON:  PET-CT-04/07/2019 MEDICATIONS: None  ANESTHESIA/SEDATION: Moderate (conscious) sedation was employed during this procedure. A total of Versed 3 mg and Fentanyl 100 mcg was administered intravenously. Moderate Sedation Time: 25 minutes. The patient's level of consciousness and vital signs were monitored continuously by radiology nursing throughout the  procedure under my direct supervision. COMPLICATIONS: None immediate. TECHNIQUE: Informed written consent was obtained from the patient after a discussion of the risks, benefits and alternatives to treatment. Questions regarding the procedure were encouraged and answered. Initial ultrasound scanning demonstrated unchanged appearance of bilateral parotid gland nodules/lymph nodes with dominant right-sided parotid gland nodule measuring approximately 1.2 cm (image 1) and dominant left-sided parotid gland nodule measuring approximately 1.4 cm (image 4). Ultrasound images were saved for procedural documentation purposes. The procedure was planned. A timeout was performed prior to the initiation of the procedure. Beginning with the right-sided parotid gland nodule/lymph node, the operative was prepped and draped in the usual sterile fashion, and a sterile drape was applied covering the operative field. A timeout was performed prior to the initiation of the procedure. Local anesthesia was provided with 1% lidocaine with epinephrine. Under direct ultrasound guidance, an 18 gauge core needle device was utilized to obtain to obtain 5 core needle biopsies of the dominant right-sided parotid gland nodule/lymph. Multiple ultrasound images were saved for procedural documentation purposes. Attention was now paid towards the dominant left-sided parotid gland nodule/lymph node. The operative was prepped and draped in the usual sterile fashion, and a sterile drape was applied covering the operative field. A timeout was performed prior to the initiation of the procedure. Local anesthesia was provided with 1% lidocaine with  epinephrine. Under direct ultrasound guidance, an 18 gauge core needle device was utilized to obtain to obtain 3 core needle biopsies of the dominant left-sided parotid gland nodule/lymph. Multiple ultrasound images were saved for procedural documentation purposes. The samples were placed in saline and submitted to pathology. The needle was removed and superficial hemostasis was achieved with manual compression. Post procedure scan was negative for significant hematoma. A dressing was placed. The patient tolerated the above procedures well without immediate postprocedural complication. IMPRESSION: 1. Technically successful ultrasound guided biopsy of dominant right-sided parotid gland nodule/lymph node. 2. Technically successful ultrasound-guided biopsy of dominant left-sided parotid gland nodule/lymph node. Electronically Signed   By: Sandi Mariscal M.D.   On: 05/04/2019 12:05   [place] Core Biopsy (salivary Gland/parotid Gland)  Result Date: 05/04/2019 INDICATION: History of lymphoma, now with indeterminate bilateral hypermetabolic parotid gland nodules/lymph nodes. Please perform ultrasound-guided biopsy for tissue diagnostic purposes. EXAM: 1. ULTRASOUND-GUIDED LEFT PAROTID GLAND NODULE/LYMPH NODE BIOPSY 2. ULTRASOUND-GUIDED RIGHT PAROTID GLAND NODULE/LYMPH NODE BIOPSY COMPARISON:  PET-CT-04/07/2019 MEDICATIONS: None ANESTHESIA/SEDATION: Moderate (conscious) sedation was employed during this procedure. A total of Versed 3 mg and Fentanyl 100 mcg was administered intravenously. Moderate Sedation Time: 25 minutes. The patient's level of consciousness and vital signs were monitored continuously by radiology nursing throughout the procedure under my direct supervision. COMPLICATIONS: None immediate. TECHNIQUE: Informed written consent was obtained from the patient after a discussion of the risks, benefits and alternatives to treatment. Questions regarding the procedure were encouraged and answered. Initial ultrasound  scanning demonstrated unchanged appearance of bilateral parotid gland nodules/lymph nodes with dominant right-sided parotid gland nodule measuring approximately 1.2 cm (image 1) and dominant left-sided parotid gland nodule measuring approximately 1.4 cm (image 4). Ultrasound images were saved for procedural documentation purposes. The procedure was planned. A timeout was performed prior to the initiation of the procedure. Beginning with the right-sided parotid gland nodule/lymph node, the operative was prepped and draped in the usual sterile fashion, and a sterile drape was applied covering the operative field. A timeout was performed prior to the initiation of the procedure. Local anesthesia was provided with 1% lidocaine with epinephrine. Under direct  ultrasound guidance, an 18 gauge core needle device was utilized to obtain to obtain 5 core needle biopsies of the dominant right-sided parotid gland nodule/lymph. Multiple ultrasound images were saved for procedural documentation purposes. Attention was now paid towards the dominant left-sided parotid gland nodule/lymph node. The operative was prepped and draped in the usual sterile fashion, and a sterile drape was applied covering the operative field. A timeout was performed prior to the initiation of the procedure. Local anesthesia was provided with 1% lidocaine with epinephrine. Under direct ultrasound guidance, an 18 gauge core needle device was utilized to obtain to obtain 3 core needle biopsies of the dominant left-sided parotid gland nodule/lymph. Multiple ultrasound images were saved for procedural documentation purposes. The samples were placed in saline and submitted to pathology. The needle was removed and superficial hemostasis was achieved with manual compression. Post procedure scan was negative for significant hematoma. A dressing was placed. The patient tolerated the above procedures well without immediate postprocedural complication. IMPRESSION: 1.  Technically successful ultrasound guided biopsy of dominant right-sided parotid gland nodule/lymph node. 2. Technically successful ultrasound-guided biopsy of dominant left-sided parotid gland nodule/lymph node. Electronically Signed   By: Sandi Mariscal M.D.   On: 05/04/2019 12:05    Assessment  Diagnoses of Fibromyalgia and Systemic lupus erythematosus (SLE) in adult [facility]) were pertinent to this visit.   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 She also has a history of post transplant lymphoproliferative disorder/DLBCL that was diagnosed in 2016. She is s/p 6 cycles of R-CHOP chemotherapy back then and was in complete remission 1. Her other past medical history significant for migraines, stage III chronic kidney disease, chemo-induced peripheral neuropathy, hypertension among other medical problems including fibromyalgia, degenerative disc disease, bilateral trigeminal neuralgia. With regards to her diffuse large B-cell lymphoma she was getting surveillance scans in [place]. MRI thoracic spine without contrast in August 2019 showed moderate multilevel spondylosis but no evidence of lymphoma. Multinodular thyroid in June 2019. MRI cervical spine May 2019 again showed spondylosis but no other acute pathology. MRI brain showed incidental partial opacification of the right and right sinus.  Plan of Care  I am having Alison [name] maintain her metFORMIN, promethazine, LORazepam, mycophenolate, lisinopril, tacrolimus, Cranberry, QUEtiapine, hydrOXYzine, baclofen, pregabalin, and tiZANidine.  Patient is high risk for opioid prescribing. The patient will not be a candidate for opioid therapy through this clinic. Patient will be optimized on non-opioid adjunctive analgesics and a comprehensive pain management plan was developed.  I had extensive discussion with the patient regarding treatment plan.   Given psychiatric assessment and her unmanaged bipolar disorder and PTSD, I do not recommend chronic opioid therapy for this patient.  Furthermore the patient states that she is more lucid and does have improved memory as a result of weaning off her opioid analgesics.  She states that she is been over 2 weeks without any opioid medications.  It is my recommendation that she refrain from opioid therapy.  She is fairly limited on other analgesics that I can offer.  Given the patient's history of liver transplant, I would avoid acetaminophen.  In regards to muscle relaxants, patient is already on high-dose baclofen 20 mg 3 times daily and hepatically dosed tizanidine 4 mg twice daily as needed as needed for severe musculoskeletal pain related to fibromyalgia.  Patient is also on high-dose Lyrica.  I expressed my concern about her dose but patient states that  she has been on this dose for many years and it helps her function.  Patient's creatinine is within normal limits.  Patient does develop any sort of renal insufficiency, would recommend decreasing her Lyrica dose.  Patient has been notified of risks associated with such a high dose which could include sedation, lower extremity swelling, cognitive side effects, nausea.  Patient is aware.  Do not recommend any interventional therapies for the patient at this time as the majority of her pain is related to malignancy, trigeminal neuralgia, fibromyalgia.  We did discuss lidocaine infusion however patient wants to hold off and discuss with her neurologist.  Lidocaine infusion has shown some benefit for trigeminal neuralgia.  We also discussed complementary and integrative pain therapies such as biofeedback, acupuncture, myofascial release therapy, TENS therapy.  Patient states that she would think about these.  Patient also inquired about CBD usage.  On her first clinic note, she noted that she utilizes marijuana.  Given that I have not considering her for  chronic opioid therapy, topical CBD has shown some benefit for certain musculoskeletal conditions.  I do not recommend the patient utilize marijuana or THC but can utilize CBD ointment as this is low risk.  I also discussed alpha lipoic acid as this is an over-the-counter agent but can also be effective for neuropathic pain.  Patient endorsed understanding.  We will refill her baclofen, tizanidine and Lyrica as below.  Have discussed patient's care with her primary care provider.   Pharmacotherapy (Medications Ordered): Meds ordered this encounter  Medications  . baclofen (LIORESAL) 20 MG tablet    Sig: Take 1 tablet (20 mg total) by mouth 3 (three) times daily.    Dispense:  30 each    Refill:  2  . pregabalin (LYRICA) 200 MG capsule    Sig: Take 1 capsule (200 mg total) by mouth 3 (three) times daily.    Dispense:  90 capsule    Refill:  2  . tiZANidine (ZANAFLEX) 4 MG tablet    Sig: Take 1 tablet (4 mg total) by mouth 2 (two) times daily as needed for muscle spasms.    Dispense:  30 tablet    Refill:  2   Follow-up plan:   Return for follow up with PCP, no return appt needed as patient is not an opioid or procedural candidate.   Recent Visits Date Type Provider Dept  05/18/19 Office Visit [name], MD [facility]  Showing recent visits within past 90 days and meeting all other requirements   Future Appointments No visits were found meeting these conditions.  Showing future appointments within next 90 days and meeting all other requirements   I discussed the assessment and treatment plan with the patient. The patient was provided an opportunity to ask questions and all were answered. The patient agreed with the plan and demonstrated an understanding of the instructions.  Patient advised to call back or seek an in-person evaluation if the symptoms or condition worsens.  Total duration of non-face-to-face encounter: 25 minutes.  Note by: [name], MD Date:  07/05/2019; Time: 1:31 PM  Note: This dictation was prepared with Dragon dictation. Any transcriptional errors that may result from this process are unintentional.  Disclaimer:  * Given the special circumstances of the COVID-19 pandemic, the federal government has announced that the Office for Civil Rights (OCR) will exercise its enforcement discretion and will not impose penalties on physicians using telehealth in the event of noncompliance with regulatory requirements under the  Health Insurance Portability and Accountability Act (HIPAA) in connection with the good faith provision of telehealth during the NWGNF-62 national public health emergency. (AMA)

## 2019-07-08 ENCOUNTER — Encounter
Admission: RE | Admit: 2019-07-08 | Discharge: 2019-07-08 | Disposition: A | Discharge status: Home / Self Care | Payer: Medicaid Other | Source: Ambulatory Visit | Attending: Oncology | Admitting: Oncology

## 2019-07-08 ENCOUNTER — Other Ambulatory Visit: Payer: Self-pay

## 2019-07-08 DIAGNOSIS — C8580 Other specified types of non-Hodgkin lymphoma, unspecified site: Secondary | ICD-10-CM | POA: Diagnosis not present

## 2019-07-08 DIAGNOSIS — C8590 Non-Hodgkin lymphoma, unspecified, unspecified site: Secondary | ICD-10-CM | POA: Diagnosis not present

## 2019-07-08 LAB — GLUCOSE, CAPILLARY: Glucose-Capillary: 126 mg/dL — ABNORMAL HIGH (ref 70–99)

## 2019-07-08 MED ORDER — FLUDEOXYGLUCOSE F - 18 (FDG) INJECTION
13.0000 | Freq: Once | INTRAVENOUS | Status: AC | PRN
  Administered 2019-07-08: 11:00:00 13.4 via INTRAVENOUS

## 2019-07-08 NOTE — Progress Notes (Signed)
patient is coming in for follow up, she had imaging done today waiting for results

## 2019-07-08 NOTE — Telephone Encounter (Signed)
Please find out why GI referral is still pending and not authorized.  She needs to obtain this medication from GI as it pertains to her hx liver transplant, therefore she needs an appt in the next 2-3 weeks.

## 2019-07-08 NOTE — Telephone Encounter (Signed)
LVM informing patient her rx (tacrolimus) has been sent to the [facility] on [address] and to make sure she keeps the appointment scheduled with the GI specialist, Dr. Allen Norris, on 07/12/2019 because the specialist will have to start managing the above rx.

## 2019-07-09 ENCOUNTER — Other Ambulatory Visit: Payer: Self-pay

## 2019-07-09 ENCOUNTER — Inpatient Hospital Stay: Payer: Medicaid Other | Attending: Oncology

## 2019-07-09 ENCOUNTER — Encounter: Payer: Self-pay | Admitting: Oncology

## 2019-07-09 ENCOUNTER — Inpatient Hospital Stay (HOSPITAL_BASED_OUTPATIENT_CLINIC_OR_DEPARTMENT_OTHER): Payer: Medicaid Other | Admitting: Oncology

## 2019-07-09 VITALS — BP 121/75 | HR 73 | Temp 96.9°F | Wt 262.0 lb

## 2019-07-09 DIAGNOSIS — C884 Extranodal marginal zone B-cell lymphoma of mucosa-associated lymphoid tissue [MALT-lymphoma]: Secondary | ICD-10-CM | POA: Insufficient documentation

## 2019-07-09 DIAGNOSIS — N183 Chronic kidney disease, stage 3 unspecified: Secondary | ICD-10-CM | POA: Diagnosis not present

## 2019-07-09 DIAGNOSIS — Z79899 Other long term (current) drug therapy: Secondary | ICD-10-CM | POA: Insufficient documentation

## 2019-07-09 DIAGNOSIS — Z9221 Personal history of antineoplastic chemotherapy: Secondary | ICD-10-CM | POA: Diagnosis not present

## 2019-07-09 DIAGNOSIS — Z944 Liver transplant status: Secondary | ICD-10-CM | POA: Insufficient documentation

## 2019-07-09 DIAGNOSIS — Z95828 Presence of other vascular implants and grafts: Secondary | ICD-10-CM

## 2019-07-09 DIAGNOSIS — E1122 Type 2 diabetes mellitus with diabetic chronic kidney disease: Secondary | ICD-10-CM | POA: Diagnosis not present

## 2019-07-09 DIAGNOSIS — C8580 Other specified types of non-Hodgkin lymphoma, unspecified site: Secondary | ICD-10-CM | POA: Diagnosis not present

## 2019-07-09 DIAGNOSIS — M1990 Unspecified osteoarthritis, unspecified site: Secondary | ICD-10-CM | POA: Diagnosis not present

## 2019-07-09 DIAGNOSIS — I129 Hypertensive chronic kidney disease with stage 1 through stage 4 chronic kidney disease, or unspecified chronic kidney disease: Secondary | ICD-10-CM | POA: Diagnosis not present

## 2019-07-09 DIAGNOSIS — Z7984 Long term (current) use of oral hypoglycemic drugs: Secondary | ICD-10-CM | POA: Insufficient documentation

## 2019-07-09 DIAGNOSIS — K219 Gastro-esophageal reflux disease without esophagitis: Secondary | ICD-10-CM | POA: Diagnosis not present

## 2019-07-09 DIAGNOSIS — B948 Sequelae of other specified infectious and parasitic diseases: Secondary | ICD-10-CM | POA: Insufficient documentation

## 2019-07-09 LAB — CBC WITH DIFFERENTIAL/PLATELET
Abs Immature Granulocytes: 0.03 10*3/uL (ref 0.00–0.07)
Basophils Absolute: 0.1 10*3/uL (ref 0.0–0.1)
Basophils Relative: 1 %
Eosinophils Absolute: 0.6 10*3/uL — ABNORMAL HIGH (ref 0.0–0.5)
Eosinophils Relative: 10 %
HCT: 37 % (ref 36.0–46.0)
Hemoglobin: 12.2 g/dL (ref 12.0–15.0)
Immature Granulocytes: 1 %
Lymphocytes Relative: 24 %
Lymphs Abs: 1.4 10*3/uL (ref 0.7–4.0)
MCH: 28.8 pg (ref 26.0–34.0)
MCHC: 33 g/dL (ref 30.0–36.0)
MCV: 87.3 fL (ref 80.0–100.0)
Monocytes Absolute: 0.5 10*3/uL (ref 0.1–1.0)
Monocytes Relative: 9 %
Neutro Abs: 3.1 10*3/uL (ref 1.7–7.7)
Neutrophils Relative %: 55 %
Platelets: 146 10*3/uL — ABNORMAL LOW (ref 150–400)
RBC: 4.24 MIL/uL (ref 3.87–5.11)
RDW: 13.4 % (ref 11.5–15.5)
WBC: 5.6 10*3/uL (ref 4.0–10.5)
nRBC: 0 % (ref 0.0–0.2)

## 2019-07-09 LAB — COMPREHENSIVE METABOLIC PANEL
ALT: 23 U/L (ref 0–44)
AST: 23 U/L (ref 15–41)
Albumin: 3.4 g/dL — ABNORMAL LOW (ref 3.5–5.0)
Alkaline Phosphatase: 55 U/L (ref 38–126)
Anion gap: 8 (ref 5–15)
BUN: 22 mg/dL — ABNORMAL HIGH (ref 6–20)
CO2: 25 mmol/L (ref 22–32)
Calcium: 8.9 mg/dL (ref 8.9–10.3)
Chloride: 105 mmol/L (ref 98–111)
Creatinine, Ser: 0.76 mg/dL (ref 0.44–1.00)
GFR calc Af Amer: >60 mL/min (ref >60)
GFR calc non Af Amer: >60 mL/min (ref >60)
Glucose, Bld: 192 mg/dL — ABNORMAL HIGH (ref 70–99)
Potassium: 4.3 mmol/L (ref 3.5–5.1)
Sodium: 138 mmol/L (ref 135–145)
Total Bilirubin: 0.6 mg/dL (ref 0.3–1.2)
Total Protein: 6.7 g/dL (ref 6.5–8.1)

## 2019-07-09 LAB — LACTATE DEHYDROGENASE: LDH: 122 U/L (ref 98–192)

## 2019-07-09 MED ORDER — SODIUM CHLORIDE 0.9% FLUSH
10.0000 mL | Freq: Once | INTRAVENOUS | Status: AC
  Administered 2019-07-09: 10 mL via INTRAVENOUS
  Filled 2019-07-09: qty 10

## 2019-07-09 MED ORDER — HEPARIN SOD (PORK) LOCK FLUSH 100 UNIT/ML IV SOLN
500.0000 [IU] | Freq: Once | INTRAVENOUS | Status: AC
  Administered 2019-07-09: 500 [IU] via INTRAVENOUS

## 2019-07-09 NOTE — Progress Notes (Signed)
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 She is currently on CellCept and tacrolimus for the same. She also has a history of post transplant lymphoproliferative disorder/DLBCL that was diagnosed in 2016. She is s/p 6 cycles of R-CHOP chemotherapy back then and was in complete remission 1. Her other past medical history significant for migraines, stage III chronic kidney disease, chemo-induced peripheral neuropathy, hypertension among other medical problems. With regards to her diffuse large B-cell lymphoma she was getting surveillance scans and this was all done in [place]. She has now moved to [facility] to be with her significant other.Patient was noted to have a prior kidney mass before which was biopsied and was not consistent with malignancy.  She underwent CT neck with contrast before she left [place] on 02/22/2019. She was noted to have soft tissue masses in both the parotid glands which were new as compared to prior exams and possibly represent lymph nodes. The largest one was seen in the left parotid gland measuring 1.3 x 1.1 cm. Before she could get a work-up for this patient moved to [place] and has not had any further work-up yet  Her other prior imaging is as follows: MRI thoracic spine without contrast in August 2019 showed moderate multilevel spondylosis but no evidence of  lymphoma. Multinodular thyroid in June 2019. MRI cervical spine May 2019 again showed spondylosis but no other acute pathology. MRI brain showed incidental partial opacification of the right and right sinus. No evidence of malignancy. I do not have any other PET CT scan or treatment records from the past.  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 Bone marrow biopsy was negative for lymphoma   Interval history-overall patient is doing well and denies any complaints other than her chronic back pain at this time.  She has also been started on saliva stimulating medications by rheumatology  ECOG PS- 1 Pain scale- 0   Review of systems- Review of Systems  Constitutional: Negative for chills, fever, malaise/fatigue and weight loss.  HENT: Negative for congestion, ear discharge and nosebleeds.   Eyes: Negative for blurred vision.  Respiratory: Negative for cough, hemoptysis, sputum production, shortness of breath and wheezing.   Cardiovascular: Negative for chest pain, palpitations, orthopnea and claudication.  Gastrointestinal: Negative for abdominal pain, blood in stool, constipation, diarrhea, heartburn, melena, nausea and vomiting.  Genitourinary: Negative for dysuria, flank pain, frequency, hematuria and urgency.  Musculoskeletal: Positive for back pain. Negative for joint pain and myalgias.  Skin: Negative for rash.  Neurological: Negative for dizziness, tingling, focal weakness, seizures, weakness and headaches.  Endo/Heme/Allergies: Does not bruise/bleed easily.  Psychiatric/Behavioral: Negative for depression and suicidal ideas. The patient does not have insomnia.        Allergies  Allergen Reactions   Carbamazepine Other (See Comments)    Medication interaction-prograf   Hydrocodone-Acetaminophen Itching   Naproxen Itching   Penicillin G Itching and Rash     Past Medical History:  Diagnosis Date   Abnormal uterine  bleeding    Allergy    Anxiety    Arthritis    Bipolar disorder (manic depression) (HCC)    Chronic kidney failure    Chronic renal disease, stage III (HCC)    Diabetes mellitus without complication (HCC)    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HCC)    FH: trigeminal neuralgia    GERD (gastroesophageal reflux disease)    Heart murmur    Hypertension    Kidney mass    Lupus (HCC)    Lupus (HCC)    lymphoma    Lymphoma (HCC)    Lymphoma (HCC)    Major depressive disorder    Migraine    Morbid obesity (HCC)    Neuromuscular disorder ([place])    neuropathy   Neuropathy    Personality disorder ([place])    Post herpetic neuralgia    PTSD (post-traumatic stress disorder)    Renal disorder    S/P liver transplant ([place])      Past Surgical History:  Procedure Laterality Date   BONE MARROW BIOPSY  01/13/2015   BREAST BIOPSY  12/2014   BREAST BIOPSY  2011   BREAST SURGERY     CHOLECYSTECTOMY     HERNIA REPAIR     infusaport     LIVER TRANSPLANT  12/17/1991   LUMBAR PUNCTURE     PORT A CATH INJECTION ([place] [facility])     tumor removal  2015    Social History   Socioeconomic History   Marital status: Single    Spouse name: Not on file   Number of children: 0   Years of education: 9   Highest education level: GED or equivalent  Occupational History   Occupation: disabled  Scientist, product/process development strain: Somewhat hard   Food insecurity    Worry: Sometimes true    Inability: Sometimes true   Transportation needs    Medical: Yes    Non-medical: Yes  Tobacco Use   Smoking status: Never Smoker   Smokeless tobacco: Never Used  Substance and Sexual Activity   Alcohol use: Not Currently    Frequency: Never   Drug use: Not Currently    Types: Marijuana    Comment: pain managment last used in early april   Sexual activity: Not Currently  Lifestyle   Physical activity    Days per week: 1 day    Minutes per  session: 30 min   Stress: Rather much  Relationships   Social connections    Talks on phone: Once a week    Gets together: Three times a week    Attends religious service: More than 4 times per year    Active member of club or organization: No    Attends meetings of clubs or organizations: Never    Relationship status: Never married   Intimate partner violence    Fear of current or ex partner: No    Emotionally abused: No    Physically abused: No    Forced sexual activity: No  Other Topics Concern   Not on file  Social History Narrative   Not on file    Family History  Problem Relation Age of Onset   Heart disease Mother    Hypertension Mother    Cancer - Other Mother    Bipolar disorder Mother    Heart disease Father    Hypertension Father    Diabetes Father    Parkinson's disease Maternal  Grandmother    Cancer Maternal Aunt    Cancer Maternal Uncle    Cancer Maternal Grandfather    Lupus Paternal Grandmother    Hypertension Brother      Current Outpatient Medications:    baclofen (LIORESAL) 20 MG tablet, Take 1 tablet (20 mg total) by mouth 3 (three) times daily., Disp: 30 each, Rfl: 2   Cranberry 500 MG TABS, Take by mouth., Disp: , Rfl:    hydrOXYzine (VISTARIL) 25 MG capsule, Take 1 capsule (25 mg total) by mouth 2 (two) times daily as needed. For sever anxiety attacks, Disp: 60 capsule, Rfl: 1   lisinopril (ZESTRIL) 20 MG tablet, Take 1 tablet (20 mg total) by mouth 1 day or 1 dose., Disp: 90 tablet, Rfl: 1   LORazepam (ATIVAN) 1 MG tablet, Take 1 tablet by mouth at bedtime as needed., Disp: , Rfl:    metFORMIN (GLUCOPHAGE) 1000 MG tablet, Take 1 tablet by mouth 2 (two) times a day., Disp: , Rfl:    mycophenolate (CELLCEPT) 250 MG capsule, Take 1 capsule by mouth 2 (two) times a day., Disp: , Rfl:    pregabalin (LYRICA) 200 MG capsule, Take 1 capsule (200 mg total) by mouth 3 (three) times daily., Disp: 90 capsule, Rfl: 2   promethazine  (PHENERGAN) 25 MG tablet, Take 1 tablet by mouth as needed., Disp: , Rfl:    QUEtiapine (SEROQUEL XR) 50 MG TB24 24 hr tablet, Take 1 tablet (50 mg total) by mouth at bedtime., Disp: 30 tablet, Rfl: 1   tacrolimus (PROGRAF) 1 MG capsule, Take 1 capsule by mouth once daily, Disp: 30 capsule, Rfl: 0   tiZANidine (ZANAFLEX) 4 MG tablet, Take 1 tablet (4 mg total) by mouth 2 (two) times daily as needed for muscle spasms., Disp: 30 tablet, Rfl: 2  Physical exam:  Vitals:   07/09/19 1027  BP: 121/75  Pulse: 73  Temp: (!) 96.9 F (36.1 C)  TempSrc: Tympanic  Weight: 262 lb (118.8 kg)   Physical Exam HENT:     Head: Normocephalic and atraumatic.  Eyes:     Pupils: Pupils are equal, round, and reactive to light.  Neck:     Musculoskeletal: Normal range of motion.  Cardiovascular:     Rate and Rhythm: Normal rate and regular rhythm.     Heart sounds: Normal heart sounds.  Pulmonary:     Effort: Pulmonary effort is normal.     Breath sounds: Normal breath sounds.  Abdominal:     General: Bowel sounds are normal.     Palpations: Abdomen is soft.  Skin:    General: Skin is warm and dry.  Neurological:     Mental Status: She is alert and oriented to person, place, and time.      CMP Latest Ref Rng & Units 07/09/2019  Glucose 70 - 99 mg/dL 192(H)  BUN 6 - 20 mg/dL 22(H)  Creatinine 0.44 - 1.00 mg/dL 0.76  Sodium 135 - 145 mmol/L 138  Potassium 3.5 - 5.1 mmol/L 4.3  Chloride 98 - 111 mmol/L 105  CO2 22 - 32 mmol/L 25  Calcium 8.9 - 10.3 mg/dL 8.9  Total Protein 6.5 - 8.1 g/dL 6.7  Total Bilirubin 0.3 - 1.2 mg/dL 0.6  Alkaline Phos 38 - 126 U/L 55  AST 15 - 41 U/L 23  ALT 0 - 44 U/L 23   CBC Latest Ref Rng & Units 07/09/2019  WBC 4.0 - 10.5 K/uL 5.6  Hemoglobin 12.0 - 15.0 g/dL 12.2  Hematocrit 36.0 - 46.0 % 37.0  Platelets 150 - 400 K/uL 146(L)    No images are attached to the encounter.  Nm Pet Image Restag (ps) Skull Base To Thigh  Result Date: 07/08/2019 CLINICAL  DATA:  Subsequent treatment strategy for lymphoma. EXAM: NUCLEAR MEDICINE PET SKULL BASE TO THIGH TECHNIQUE: 13.5 mCi F-18 FDG was injected intravenously. Full-ring PET imaging was performed from the skull base to thigh after the radiotracer. CT data was obtained and used for attenuation correction and anatomic localization. Fasting blood glucose: 126 mg/dl COMPARISON:  04/07/2019 FINDINGS: Mediastinal blood pool activity: SUV max 3.83 Liver activity: SUV max 5.15 NECK: There is been interval resolution of previous FDG avid lesions in the parotid glands bilaterally. Incidental CT findings: None CHEST: No hypermetabolic supraclavicular, axillary, mediastinal, or hilar lymph nodes. No hypermetabolic pulmonary nodule or mass. Left circumflex coronary artery atherosclerotic calcification. Incidental CT findings: none ABDOMEN/PELVIS: No abnormal hypermetabolic activity within the liver, pancreas, adrenal glands, or spleen. No hypermetabolic lymph nodes in the abdomen or pelvis. No diffuse or focal uptake within the spleen. The spleen is normal in size measuring 9.6 cm. Incidental CT findings: Status post right lateral abdominal wall herniorrhaphy. Along the medial margin of the hernia mesh is a hernia which contains fat only measuring 8.8 cm transverse. Mild aortic atherosclerosis. Left kidney scarring and atrophy. SKELETON: No focal hypermetabolic activity to suggest skeletal metastasis. Incidental CT findings: none IMPRESSION: 1. Interval resolution of FDG avid parotid lesions compatible with complete response to therapy. No new foci of increased uptake identified. 2. Aortic Atherosclerosis (ICD10-I70.0). Coronary artery calcification noted. 3. Status post right lateral abdominal wall herniorrhaphy. Along the medial margin of the hernia mesh is a 8.8 cm fat containing abdominal wall hernia. Electronically Signed   By: Kerby Moors M.D.   On: 07/08/2019 13:11     Assessment and plan- Patient is a 44 y.o. 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   She is here to discuss the results of PET CT scan  Patient underwent 4 weekly cycles of Rituxan for bilateral parotid gland MALT lymphoma.  PET CT scan presently shows resolution of hypermetabolism in that area and no evidence of hypermetabolism elsewhere.  I suspect her MALT lymphoma was in the setting of possible Sjogren's syndrome.  With regards to her lymphoma she would only need clinical surveillance and scans if there are any suspicious signs and symptoms.  I will therefore see her back in 3 months time with CBC with differential, CMP and LDH.  Patient is also on immunosuppression given her liver transplant status and has been following up with liver transplant surgeons at [facility] as well.   Visit Diagnosis 1. 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HCC)      Dr. Randa Evens, MD, MPH [facility] [phone] 07/09/2019 1:03 PM

## 2019-07-12 ENCOUNTER — Encounter: Payer: Self-pay | Admitting: Emergency Medicine

## 2019-07-12 ENCOUNTER — Encounter: Payer: Self-pay | Admitting: Psychiatry

## 2019-07-12 ENCOUNTER — Emergency Department
Admission: EM | Admit: 2019-07-12 | Discharge: 2019-07-12 | Disposition: A | Discharge status: Home / Self Care | Payer: Medicaid Other | Attending: Student in an Organized Health Care Education/Training Program | Admitting: Student in an Organized Health Care Education/Training Program

## 2019-07-12 ENCOUNTER — Other Ambulatory Visit: Payer: Self-pay

## 2019-07-12 ENCOUNTER — Ambulatory Visit (INDEPENDENT_AMBULATORY_CARE_PROVIDER_SITE_OTHER): Payer: Medicaid Other | Admitting: Psychiatry

## 2019-07-12 ENCOUNTER — Encounter: Payer: Self-pay | Admitting: Family Medicine

## 2019-07-12 ENCOUNTER — Ambulatory Visit: Payer: Medicaid Other | Admitting: Gastroenterology

## 2019-07-12 DIAGNOSIS — N183 Chronic kidney disease, stage 3 unspecified: Secondary | ICD-10-CM | POA: Insufficient documentation

## 2019-07-12 DIAGNOSIS — Z8572 Personal history of non-Hodgkin lymphomas: Secondary | ICD-10-CM | POA: Insufficient documentation

## 2019-07-12 DIAGNOSIS — Z79899 Other long term (current) drug therapy: Secondary | ICD-10-CM | POA: Diagnosis not present

## 2019-07-12 DIAGNOSIS — R300 Dysuria: Secondary | ICD-10-CM | POA: Diagnosis not present

## 2019-07-12 DIAGNOSIS — G500 Trigeminal neuralgia: Secondary | ICD-10-CM | POA: Diagnosis not present

## 2019-07-12 DIAGNOSIS — F3130 Bipolar disorder, current episode depressed, mild or moderate severity, unspecified: Secondary | ICD-10-CM | POA: Insufficient documentation

## 2019-07-12 DIAGNOSIS — Z944 Liver transplant status: Secondary | ICD-10-CM | POA: Diagnosis not present

## 2019-07-12 DIAGNOSIS — Z7984 Long term (current) use of oral hypoglycemic drugs: Secondary | ICD-10-CM | POA: Diagnosis not present

## 2019-07-12 DIAGNOSIS — I129 Hypertensive chronic kidney disease with stage 1 through stage 4 chronic kidney disease, or unspecified chronic kidney disease: Secondary | ICD-10-CM | POA: Diagnosis not present

## 2019-07-12 DIAGNOSIS — R519 Headache, unspecified: Secondary | ICD-10-CM | POA: Diagnosis present

## 2019-07-12 DIAGNOSIS — E1122 Type 2 diabetes mellitus with diabetic chronic kidney disease: Secondary | ICD-10-CM | POA: Insufficient documentation

## 2019-07-12 DIAGNOSIS — F4310 Post-traumatic stress disorder, unspecified: Secondary | ICD-10-CM | POA: Insufficient documentation

## 2019-07-12 LAB — CBC WITH DIFFERENTIAL/PLATELET
Abs Immature Granulocytes: 0.05 10*3/uL (ref 0.00–0.07)
Basophils Absolute: 0.1 10*3/uL (ref 0.0–0.1)
Basophils Relative: 1 %
Eosinophils Absolute: 0.8 10*3/uL — ABNORMAL HIGH (ref 0.0–0.5)
Eosinophils Relative: 10 %
HCT: 42.2 % (ref 36.0–46.0)
Hemoglobin: 14.4 g/dL (ref 12.0–15.0)
Immature Granulocytes: 1 %
Lymphocytes Relative: 19 %
Lymphs Abs: 1.5 10*3/uL (ref 0.7–4.0)
MCH: 29.2 pg (ref 26.0–34.0)
MCHC: 34.1 g/dL (ref 30.0–36.0)
MCV: 85.6 fL (ref 80.0–100.0)
Monocytes Absolute: 0.6 10*3/uL (ref 0.1–1.0)
Monocytes Relative: 8 %
Neutro Abs: 4.7 10*3/uL (ref 1.7–7.7)
Neutrophils Relative %: 61 %
Platelets: 156 10*3/uL (ref 150–400)
RBC: 4.93 MIL/uL (ref 3.87–5.11)
RDW: 13.2 % (ref 11.5–15.5)
WBC: 7.6 10*3/uL (ref 4.0–10.5)
nRBC: 0 % (ref 0.0–0.2)

## 2019-07-12 LAB — URINALYSIS, COMPLETE (UACMP) WITH MICROSCOPIC
Bilirubin Urine: NEGATIVE
Glucose, UA: NEGATIVE mg/dL
Hgb urine dipstick: NEGATIVE
Ketones, ur: NEGATIVE mg/dL
Nitrite: NEGATIVE
Protein, ur: NEGATIVE mg/dL
Specific Gravity, Urine: 1.008 (ref 1.005–1.030)
Squamous Epithelial / HPF: NONE SEEN (ref 0–5)
pH: 6 (ref 5.0–8.0)

## 2019-07-12 LAB — COMPREHENSIVE METABOLIC PANEL
ALT: 23 U/L (ref 0–44)
AST: 21 U/L (ref 15–41)
Albumin: 3.9 g/dL (ref 3.5–5.0)
Alkaline Phosphatase: 65 U/L (ref 38–126)
Anion gap: 12 (ref 5–15)
BUN: 24 mg/dL — ABNORMAL HIGH (ref 6–20)
CO2: 23 mmol/L (ref 22–32)
Calcium: 9.5 mg/dL (ref 8.9–10.3)
Chloride: 100 mmol/L (ref 98–111)
Creatinine, Ser: 0.9 mg/dL (ref 0.44–1.00)
GFR calc Af Amer: >60 mL/min (ref >60)
GFR calc non Af Amer: >60 mL/min (ref >60)
Glucose, Bld: 154 mg/dL — ABNORMAL HIGH (ref 70–99)
Potassium: 4.5 mmol/L (ref 3.5–5.1)
Sodium: 135 mmol/L (ref 135–145)
Total Bilirubin: 0.8 mg/dL (ref 0.3–1.2)
Total Protein: 7.4 g/dL (ref 6.5–8.1)

## 2019-07-12 MED ORDER — QUETIAPINE FUMARATE ER 50 MG PO TB24: 100.0000 mg | ORAL_TABLET | Freq: Every day | ORAL | 0 refills | Status: DC

## 2019-07-12 MED ORDER — NITROFURANTOIN MONOHYD MACRO 100 MG PO CAPS: 100.0000 mg | ORAL_CAPSULE | Freq: Two times a day (BID) | ORAL | 0 refills | Status: AC

## 2019-07-12 MED ORDER — SODIUM CHLORIDE 0.9% FLUSH: 3.0000 mL | Freq: Once | INTRAVENOUS | Status: DC

## 2019-07-12 MED ORDER — KETOROLAC TROMETHAMINE 30 MG/ML IJ SOLN
30.0000 mg | Freq: Once | INTRAMUSCULAR | Status: AC
  Administered 2019-07-12: 30 mg via INTRAMUSCULAR
  Filled 2019-07-12: qty 1

## 2019-07-12 NOTE — ED Provider Notes (Signed)
[facility] Emergency Department Provider Note    First MD Initiated Contact with Patient 07/12/19 1307     (approximate)  I have reviewed the triage vital signs and the nursing notes.   HISTORY  Chief Complaint Facial Pain    HPI Alison [name] is a 43 y.o. female close past medical history with recurrent trigeminal neuralgia presents the ER for flareup of her trigeminal neuralgia.     Says the pain is consistent with previous left-sided headaches.  Does not want to take any oxycodone due to narcotic medication which she was prescribed in the past.  Denies any fevers.  No blurred vision.  No other numbness or tingling.  States that she is on Lyrica as well as baclofen.  Has not been able to to take carbamazepine due to medication interactions.  Is been evaluated for neurosurgical intervention.   Past Medical History:  Diagnosis Date  . Abnormal uterine bleeding   . Allergy   . Anxiety   . Arthritis   . Bipolar disorder (manic depression) ([place])   . Chronic kidney failure   . Chronic renal disease, stage III   . Diabetes mellitus without complication ([place])   . 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 FH: trigeminal neuralgia   . GERD (gastroesophageal reflux disease)   . Heart murmur   . Hypertension   . Kidney mass   . Lupus ([place])   . Lupus ([place])   . lymphoma   . Lymphoma ([place])   . Lymphoma ([place])   . Major depressive disorder   . Migraine   . Morbid obesity ([place])   . Neuromuscular disorder (HCC)    neuropathy  . Neuropathy   . Personality disorder ([place])   . Post herpetic neuralgia   . PTSD (post-traumatic stress disorder)   . Renal disorder   . S/P liver transplant ([place])    Family History  Problem Relation Age of Onset  . Heart disease Mother   . Hypertension Mother   . Cancer - Other Mother   . Bipolar disorder Mother   . Heart disease Father   . Hypertension Father   . Diabetes Father   . Parkinson's disease Maternal  Grandmother   . Cancer Maternal Aunt   . Cancer Maternal Uncle   . Cancer Maternal Grandfather   . Lupus Paternal Grandmother   . Hypertension Brother    Past Surgical History:  Procedure Laterality Date  . BONE MARROW BIOPSY  01/13/2015  . BREAST BIOPSY  12/2014  . BREAST BIOPSY  2011  . BREAST SURGERY    . CHOLECYSTECTOMY    . HERNIA REPAIR    . infusaport    . LIVER TRANSPLANT  12/17/1991  . LUMBAR PUNCTURE    . PORT A CATH INJECTION ([place] HX)    . tumor removal  2015   Patient Active Problem List   Diagnosis Date Noted  . Bipolar I disorder, most recent episode depressed ([place]) 07/12/2019  . PTSD (post-traumatic stress disorder) 07/12/2019  . Umbilical hernia without obstruction and without gangrene 06/17/2019  . Encounter for surveillance of abnormal nevi 06/17/2019  . Pineal gland cyst 06/17/2019  . Chronic hip pain, left 06/02/2019  . Lumbar spondylosis 06/02/2019  . Mouth dryness 06/02/2019  . Obesity (BMI 35.0-39.9 without comorbidity) 06/02/2019  . Goals of care, counseling/discussion 05/18/2019  . 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  Occipital neuralgia 05/13/2019  . Plantar fasciitis of left foot 04/27/2019  . Fibromyalgia 03/23/2019  . Systemic lupus erythematosus (SLE) in adult ([place]) 03/23/2019  . Essential hypertension 03/23/2019  . Hepatitis 03/23/2019  . Mass of left kidney 03/23/2019  . Diabetes mellitus ([place]) 03/23/2019  . Nausea without vomiting 03/23/2019  . Neuropathy 03/23/2019  . Cancer ([place]) 03/23/2019  . Hyperlipidemia 03/23/2019  . Osteoarthritis 03/23/2019  . Trigeminal neuralgia 03/23/2019  . Chronic renal disease, stage III 03/23/2019  . Nephrolithiasis 03/23/2019  . Migraine 03/23/2019  . OSA on CPAP 03/23/2019  . Fibrocystic breast 03/23/2019  . Heart murmur 03/23/2019  . Bipolar disorder (manic depression) ([place])  07/02/2017  . Abnormal uterine bleeding 03/18/2017  . Major depressive disorder, recurrent ([place]) 03/18/2017  . Morbid obesity ([place]) 03/18/2017  . Secondary hyperparathyroidism ([place]) 02/07/2017  . Neuropathic pain 11/19/2016  . Post herpetic neuralgia 11/19/2016  . 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) 01/13/2015  . Lumbar disc disease with radiculopathy 07/26/2013  . S/P liver transplant ([place]) 07/26/2013  . Type 2 diabetes mellitus, uncontrolled ([place]) 07/26/2013  . Facial nerve disorder 06/29/2012  . Low back pain 12/26/2010      Prior to Admission medications   Medication Sig Start Date End Date Taking? Authorizing Provider  baclofen (LIORESAL) 20 MG tablet Take 1 tablet (20 mg total) by mouth 3 (three) times daily. 07/05/19   [name], MD  Cranberry 500 MG TABS Take by mouth.    [provider]  hydrOXYzine (VISTARIL) 25 MG capsule Take 1 capsule (25 mg total) by mouth 2 (two) times daily as needed. For sever anxiety attacks 06/10/19   [name], MD  lisinopril (ZESTRIL) 20 MG tablet Take 1 tablet (20 mg total) by mouth 1 day or 1 dose. 04/05/19   [name], FNP  LORazepam (ATIVAN) 1 MG tablet Take 1 tablet by mouth at bedtime as needed.    [provider]  metFORMIN (GLUCOPHAGE) 1000 MG tablet Take 1 tablet by mouth 2 (two) times a day.    [provider]  mycophenolate (CELLCEPT) 250 MG capsule Take 1 capsule by mouth 2 (two) times a day.    [provider]  nitrofurantoin, macrocrystal-monohydrate, (MACROBID) 100 MG capsule Take 1 capsule (100 mg total) by mouth 2 (two) times daily for 3 days. 07/12/19 07/15/19  Merlyn Lot, MD  pregabalin (LYRICA) 200 MG capsule Take 1 capsule (200 mg total) by mouth 3 (three) times daily. 07/05/19 10/03/19  [name], MD  promethazine (PHENERGAN) 25 MG tablet Take 1 tablet by mouth as needed.    [provider]  QUEtiapine (SEROQUEL XR) 50 MG TB24 24 hr tablet Take 2 tablets  (100 mg total) by mouth at bedtime. 07/12/19   [name], MD  SUMAtriptan (IMITREX) 100 MG tablet TAKE 1 TABLET BY MOUTH ONCE AS NEEDED FOR MIGRAINE. MAY TAKE A SECOND DOSE AFTER 2 HOURS IF NEEDED 05/19/19   [provider]  tacrolimus (PROGRAF) 1 MG capsule Take 1 capsule by mouth once daily 07/08/19   [name], FNP  tiZANidine (ZANAFLEX) 4 MG tablet Take 1 tablet (4 mg total) by mouth 2 (two) times daily as needed for muscle spasms. 07/05/19 07/04/20  [name], MD    Allergies Carbamazepine, Hydrocodone-acetaminophen, Naproxen, and Penicillin g    Social History Social History   Tobacco Use  . Smoking status: Never Smoker  . Smokeless tobacco: Never Used  Substance Use Topics  . Alcohol use: Not Currently  Frequency: Never  . Drug use: Not Currently    Types: Marijuana    Comment: pain managment last used in early april    Review of Systems Patient denies headaches, rhinorrhea, blurry vision, numbness, shortness of breath, chest pain, edema, cough, abdominal pain, nausea, vomiting, diarrhea, dysuria, fevers, rashes or hallucinations unless otherwise stated above in HPI. ____________________________________________   PHYSICAL EXAM:  VITAL SIGNS: Vitals:   07/12/19 1124 07/12/19 1501  BP: (!) 143/86 (!) 138/97  Pulse: 74 80  Resp: 20 16  Temp: 98.7 F (37.1 C) 98.6 F (37 C)  SpO2: 97% 100%    Constitutional: Alert and oriented.  Eyes: Conjunctivae are normal.  Head: Atraumatic. Nose: No congestion/rhinnorhea. Mouth/Throat: Mucous membranes are moist.   Neck: No stridor. Painless ROM.  Cardiovascular: Normal rate, regular rhythm. Grossly normal heart sounds.  Good peripheral circulation. Respiratory: Normal respiratory effort.  No retractions. Lungs CTAB. Gastrointestinal: Soft and nontender. No distention. No abdominal bruits. No CVA tenderness. Genitourinary:  Musculoskeletal: No lower extremity tenderness nor edema.  No joint effusions.  Neurologic:  CN- intact.  No facial droop, Normal FNF.  Normal heel to shin.  Sensation intact bilaterally. Normal speech and language. No gross focal neurologic deficits are appreciated. No gait instability. Skin:  Skin is warm, dry and intact. No rash noted. Psychiatric: Mood and affect are normal. Speech and behavior are normal.  ____________________________________________   LABS (all labs ordered are listed, but only abnormal results are displayed)  Results for orders placed or performed during the hospital encounter of 07/12/19 (from the past 24 hour(s))  Comprehensive metabolic panel     Status: Abnormal   Collection Time: 07/12/19 11:42 AM  Result Value Ref Range   Sodium 135 135 - 145 mmol/L   Potassium 4.5 3.5 - 5.1 mmol/L   Chloride 100 98 - 111 mmol/L   CO2 23 22 - 32 mmol/L   Glucose, Bld 154 (H) 70 - 99 mg/dL   BUN 24 (H) 6 - 20 mg/dL   Creatinine, Ser 0.90 0.44 - 1.00 mg/dL   Calcium 9.5 8.9 - 10.3 mg/dL   Total Protein 7.4 6.5 - 8.1 g/dL   Albumin 3.9 3.5 - 5.0 g/dL   AST 21 15 - 41 U/L   ALT 23 0 - 44 U/L   Alkaline Phosphatase 65 38 - 126 U/L   Total Bilirubin 0.8 0.3 - 1.2 mg/dL   GFR calc non Af Amer >60 >60 mL/min   GFR calc Af Amer >60 >60 mL/min   Anion gap 12 5 - 15  CBC with Differential     Status: Abnormal   Collection Time: 07/12/19 11:42 AM  Result Value Ref Range   WBC 7.6 4.0 - 10.5 K/uL   RBC 4.93 3.87 - 5.11 MIL/uL   Hemoglobin 14.4 12.0 - 15.0 g/dL   HCT 42.2 36.0 - 46.0 %   MCV 85.6 80.0 - 100.0 fL   MCH 29.2 26.0 - 34.0 pg   MCHC 34.1 30.0 - 36.0 g/dL   RDW 13.2 11.5 - 15.5 %   Platelets 156 150 - 400 K/uL   nRBC 0.0 0.0 - 0.2 %   Neutrophils Relative % 61 %   Neutro Abs 4.7 1.7 - 7.7 K/uL   Lymphocytes Relative 19 %   Lymphs Abs 1.5 0.7 - 4.0 K/uL   Monocytes Relative 8 %   Monocytes Absolute 0.6 0.1 - 1.0 K/uL   Eosinophils Relative 10 %   Eosinophils Absolute 0.8 (H) 0.0 -  0.5 K/uL   Basophils Relative 1 %   Basophils  Absolute 0.1 0.0 - 0.1 K/uL   Immature Granulocytes 1 %   Abs Immature Granulocytes 0.05 0.00 - 0.07 K/uL  Urinalysis, Complete w Microscopic     Status: Abnormal   Collection Time: 07/12/19  2:38 PM  Result Value Ref Range   Color, Urine STRAW (A) YELLOW   APPearance CLEAR (A) CLEAR   Specific Gravity, Urine 1.008 1.005 - 1.030   pH 6.0 5.0 - 8.0   Glucose, UA NEGATIVE NEGATIVE mg/dL   Hgb urine dipstick NEGATIVE NEGATIVE   Bilirubin Urine NEGATIVE NEGATIVE   Ketones, ur NEGATIVE NEGATIVE mg/dL   Protein, ur NEGATIVE NEGATIVE mg/dL   Nitrite NEGATIVE NEGATIVE   Leukocytes,Ua SMALL (A) NEGATIVE   RBC / HPF 0-5 0 - 5 RBC/hpf   WBC, UA 11-20 0 - 5 WBC/hpf   Bacteria, UA RARE (A) NONE SEEN   Squamous Epithelial / LPF NONE SEEN 0 - 5   Mucus PRESENT    ____________________________________________ ____________________________________________  RADIOLOGY  ____________________________________________   PROCEDURES  Procedure(s) performed:  Procedures    Critical Care performed: no ____________________________________________   INITIAL IMPRESSION / ASSESSMENT AND PLAN / ED COURSE  Pertinent labs & imaging results that were available during my care of the patient were reviewed by me and considered in my medical decision making (see chart for details).   DDX: TGN, cluster, tension, migraine  Neviah Braud is a 43 y.o. who presents to the ED with headache symptoms as described above.  Patient nontoxic-appearing.  Feels that symptoms are consistent with previous episodes.  Has multiple medication intolerances and interactions limiting treatment.  Will give anti-inflammatory in the form of Toradol and reassess.  Her neuro exam is intact.  Do not feel that urgent neuroimaging clinically indicated.  Clinical Course as of Jul 11 1725  Mon Jul 12, 2019  1452 Patient feels significantly improved.  She is complaining of some dysuria consistent with previous urinary tract infections.   Denies any flank pain or fevers.  Requesting discharge home at this time I do believe she stable and appropriate for outpatient follow-up.  Have discussed with the patient and available family all diagnostics and treatments performed thus far and all questions were answered to the best of my ability. The patient demonstrates understanding and agreement with plan.    [PR]    Clinical Course User Index [PR] Merlyn Lot, MD    The patient was evaluated in Emergency Department today for the symptoms described in the history of present illness. He/she was evaluated in the context of the global COVID-19 pandemic, which necessitated consideration that the patient might be at risk for infection with the SARS-CoV-2 virus that causes COVID-19. Institutional protocols and algorithms that pertain to the evaluation of patients at risk for COVID-19 are in a state of rapid change based on information released by regulatory bodies including the CDC and federal and state organizations. These policies and algorithms were followed during the patient's care in the ED.  As part of my medical decision making, I reviewed the following data within the [place] notes reviewed and incorporated, Labs reviewed, notes from prior ED visits and [place] Controlled Substance Database   ____________________________________________   FINAL CLINICAL IMPRESSION(S) / ED DIAGNOSES  Final diagnoses:  Trigeminal neuralgia  Dysuria      NEW MEDICATIONS STARTED DURING THIS VISIT:  Discharge Medication List as of 07/12/2019  2:55 PM    START  taking these medications   Details  nitrofurantoin, macrocrystal-monohydrate, (MACROBID) 100 MG capsule Take 1 capsule (100 mg total) by mouth 2 (two) times daily for 3 days., Starting Mon 07/12/2019, Until Thu 07/15/2019, Normal         Note:  This document was prepared using Dragon voice recognition software and may include unintentional dictation errors.     Merlyn Lot, MD 07/12/19 1726

## 2019-07-12 NOTE — ED Notes (Signed)
AAOx3.  Skin warm and dry.  NAD 

## 2019-07-12 NOTE — ED Triage Notes (Signed)
Pt presents via pov from home with a flare up of trigeminal neuralgia. Pt states she has had the condition since 2007 and that she typically has 1-2 flare ups per year. This is the first time this year. Pt states the pain began on Saturday and has continued to worsen. Pt alert & oriented with NAD noted.

## 2019-07-12 NOTE — Progress Notes (Signed)
Virtual Visit via Video Note  I connected with Alison [name] on 07/12/19 at 10:30 AM EDT by a video enabled telemedicine application and verified that I am speaking with the correct person using two identifiers.   I discussed the limitations of evaluation and management by telemedicine and the availability of in person appointments. The patient expressed understanding and agreed to proceed.     I discussed the assessment and treatment plan with the patient. The patient was provided an opportunity to ask questions and all were answered. The patient agreed with the plan and demonstrated an understanding of the instructions.   The patient was advised to call back or seek an in-person evaluation if the symptoms worsen or if the condition fails to improve as anticipated.  [facility] [place] MD OP Progress Note  07/12/2019 12:55 PM Alison [name]  MRN:  [id number]  Chief Complaint:  Chief Complaint    Follow-up     HPI: Alison [name] is a 43 year old female, single, currently lives in [place], originally from [place] was evaluated by telemedicine today.  Patient has a history of bipolar disorder, PTSD, history of borderline personality disorder, chronic pain from fibromyalgia and trigeminal neuralgia, history of liver transplant, B-cell lymphoma, SLE, diabetes melitis, hypertension, OSA, migraine headaches.  Patient today reports she is in severe pain from her trigeminal neuralgia.  She is currently going through a flareup.  She reports she has already reached out to her pain provider to get an appointment.  She also reports she is thinking about going to the emergency department since the pain is too severe.  She reports the pain is making her anxious ,otherwise was doing okay until this pain started.  The Seroquel does help with her mood symptoms.  Her sleep continues to be affected.  She reports she has difficulty falling asleep.  However once she falls asleep she is able to stay asleep.  Patient denies  any side effects to the Seroquel.   She denies any suicidality, homicidality or perceptual disturbances.  She reports she was finally able to find a therapist who will work with her however the therapist will only see her in February 2021.  She denies any other concerns. Visit Diagnosis:    ICD-10-CM   1. Bipolar I disorder, most recent episode depressed (HCC)  F31.30 QUEtiapine (SEROQUEL XR) 50 MG TB24 24 hr tablet  2. PTSD (post-traumatic stress disorder)  F43.10 QUEtiapine (SEROQUEL XR) 50 MG TB24 24 hr tablet    Past Psychiatric History: I have reviewed past psychiatric history from my progress note on 06/10/2019.  Past trials of Klonopin.  Past Medical History:  Past Medical History:  Diagnosis Date  . Abnormal uterine bleeding   . Allergy   . Anxiety   . Arthritis   . Bipolar disorder (manic depression) ([place])   . Chronic kidney failure   . Chronic renal disease, stage III   . Diabetes mellitus without complication ([place])   . 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[place])   . FH: trigeminal neuralgia   . GERD (gastroesophageal reflux disease)   . Heart murmur   . Hypertension   . Kidney mass   . Lupus ([place])   . Lupus ([place])   . lymphoma   . Lymphoma ([place])   . Lymphoma ([place])   . Major depressive disorder   . Migraine   . Morbid obesity ([place])   . Neuromuscular disorder (HCC)    neuropathy  . Neuropathy   . Personality disorder ([place])   . Post  herpetic neuralgia   . PTSD (post-traumatic stress disorder)   . Renal disorder   . S/P liver transplant [facility])     Past Surgical History:  Procedure Laterality Date  . BONE MARROW BIOPSY  01/13/2015  . BREAST BIOPSY  12/2014  . BREAST BIOPSY  2011  . BREAST SURGERY    . CHOLECYSTECTOMY    . HERNIA REPAIR    . infusaport    . LIVER TRANSPLANT  12/17/1991  . LUMBAR PUNCTURE    . PORT A CATH INJECTION ([place] HX)    . tumor removal  2015    Family Psychiatric History: I have reviewed family psychiatric history from my  progress note on 06/10/2019  Family History:  Family History  Problem Relation Age of Onset  . Heart disease Mother   . Hypertension Mother   . Cancer - Other Mother   . Bipolar disorder Mother   . Heart disease Father   . Hypertension Father   . Diabetes Father   . Parkinson's disease Maternal Grandmother   . Cancer Maternal Aunt   . Cancer Maternal Uncle   . Cancer Maternal Grandfather   . Lupus Paternal Grandmother   . Hypertension Brother     Social History: I have reviewed social history from my progress note on 06/10/2019 Social History   Socioeconomic History  . Marital status: Single    Spouse name: Not on file  . Number of children: 0  . Years of education: 9  . Highest education level: GED or equivalent  Occupational History  . Occupation: disabled  Social Needs  . Financial resource strain: Somewhat hard  . Food insecurity    Worry: Sometimes true    Inability: Sometimes true  . Transportation needs    Medical: Yes    Non-medical: Yes  Tobacco Use  . Smoking status: Never Smoker  . Smokeless tobacco: Never Used  Substance and Sexual Activity  . Alcohol use: Not Currently    Frequency: Never  . Drug use: Not Currently    Types: Marijuana    Comment: pain managment last used in early april  . Sexual activity: Not Currently  Lifestyle  . Physical activity    Days per week: 1 day    Minutes per session: 30 min  . Stress: Rather much  Relationships  . Social connections    Talks on phone: Once a week    Gets together: Three times a week    Attends religious service: More than 4 times per year    Active member of club or organization: No    Attends meetings of clubs or organizations: Never    Relationship status: Never married  Other Topics Concern  . Not on file  Social History Narrative  . Not on file    Allergies:  Allergies  Allergen Reactions  . Carbamazepine Other (See Comments)    Medication interaction-prograf  .  Hydrocodone-Acetaminophen Itching  . Naproxen Itching  . Penicillin G Itching and Rash    Metabolic Disorder Labs: No results found for: HGBA1C, MPG Lab Results  Component Value Date   PROLACTIN 21.3 05/24/2019   No results found for: CHOL, TRIG, HDL, CHOLHDL, VLDL, LDLCALC Lab Results  Component Value Date   TSH 4.650 (H) 05/24/2019    Therapeutic Level Labs: No results found for: LITHIUM No results found for: VALPROATE No components found for:  CBMZ  Current Medications: Current Outpatient Medications  Medication Sig Dispense Refill  . baclofen (LIORESAL) 20 MG tablet  Take 1 tablet (20 mg total) by mouth 3 (three) times daily. 30 each 2  . Cranberry 500 MG TABS Take by mouth.    . hydrOXYzine (VISTARIL) 25 MG capsule Take 1 capsule (25 mg total) by mouth 2 (two) times daily as needed. For sever anxiety attacks 60 capsule 1  . lisinopril (ZESTRIL) 20 MG tablet Take 1 tablet (20 mg total) by mouth 1 day or 1 dose. 90 tablet 1  . LORazepam (ATIVAN) 1 MG tablet Take 1 tablet by mouth at bedtime as needed.    . metFORMIN (GLUCOPHAGE) 1000 MG tablet Take 1 tablet by mouth 2 (two) times a day.    . mycophenolate (CELLCEPT) 250 MG capsule Take 1 capsule by mouth 2 (two) times a day.    . pregabalin (LYRICA) 200 MG capsule Take 1 capsule (200 mg total) by mouth 3 (three) times daily. 90 capsule 2  . promethazine (PHENERGAN) 25 MG tablet Take 1 tablet by mouth as needed.    [name] QUEtiapine (SEROQUEL XR) 50 MG TB24 24 hr tablet Take 2 tablets (100 mg total) by mouth at bedtime. 180 tablet 0  . SUMAtriptan (IMITREX) 100 MG tablet TAKE 1 TABLET BY MOUTH ONCE AS NEEDED FOR MIGRAINE. MAY TAKE A SECOND DOSE AFTER 2 HOURS IF NEEDED    . tacrolimus (PROGRAF) 1 MG capsule Take 1 capsule by mouth once daily 30 capsule 0  . tiZANidine (ZANAFLEX) 4 MG tablet Take 1 tablet (4 mg total) by mouth 2 (two) times daily as needed for muscle spasms. 30 tablet 2   No current facility-administered medications  for this visit.    Facility-Administered Medications Ordered in Other Visits  Medication Dose Route Frequency Provider Last Rate Last Dose  . sodium chloride flush (NS) 0.9 % injection 3 mL  3 mL Intravenous Once Blake Divine, MD         Musculoskeletal: Strength & Muscle Tone: UTA Gait & Station: Observed as seated Patient leans: N/A  Psychiatric Specialty Exam: Review of Systems  HENT:       Facial pain  Psychiatric/Behavioral: The patient is nervous/anxious and has insomnia.   All other systems reviewed and are negative.   Last menstrual period 02/04/2018.There is no height or weight on file to calculate BMI.  General Appearance: Casual  Eye Contact:  Fair  Speech:  Clear and Coherent  Volume:  Normal  Mood:  Anxious  Affect:  Congruent  Thought Process:  Goal Directed and Descriptions of Associations: Intact  Orientation:  Full (Time, Place, and Person)  Thought Content: Logical   Suicidal Thoughts:  No  Homicidal Thoughts:  No  Memory:  Immediate;   Fair Recent;   Fair Remote;   Fair  Judgement:  Fair  Insight:  Fair  Psychomotor Activity:  Normal  Concentration:  Concentration: Fair and Attention Span: Fair  Recall:  [employer] of Knowledge: Fair  Language: Fair  Akathisia:  No  Handed:  Right  AIMS (if indicated): denies tremors, rigidity  Assets:  Communication Skills Desire for Improvement Housing Social Support  ADL's:  Intact  Cognition: WNL  Sleep:  restless   Screenings: PHQ2-9     Office Visit from 06/17/2019 in [facility] Office Visit from 05/20/2019 in [facility] Office Visit from 04/05/2019 in [facility]  PHQ-2 Total Score  4  2  6   PHQ-9 Total Score  16  6  18        Assessment and  Plan: Alison [name] is a 43 year old Caucasian female, single, disability, has a history of bipolar disorder, PTSD, chronic pain from fibromyalgia and trigeminal neuralgia, status post liver transplant,  B-cell lymphoma, SLE, OSA, diabetes, hypertension was evaluated by telemedicine today.  Patient is currently struggling with pain from trigeminal neuralgia flareup.  She continues to struggle with sleep problems.  Patient will benefit from medication readjustment as well as psychotherapy sessions.  Plan  For bipolar disorder-unstable Increase Seroquel XR to 100 mg p.o. nightly   For PTSD-unstable Hydroxyzine 25 mg p.o. twice daily as needed for anxiety attacks. We will refer her for psychotherapy sessions again.  She reports she was finally able to find a therapist however her next availability is early February 2021.  Provided her information for another therapist in the community.  Pending labs-hemoglobin A1c, lipid panel, prolactin, thyroid panel and EKG to monitor QTC.  Follow-up in clinic in 1 month or sooner if needed.  November fourth at 2:20 PM  I have spent atleast 15 minutes non  face to face with patient today. More than 50 % of the time was spent for psychoeducation and supportive psychotherapy and care coordination. This note was generated in part or whole with voice recognition software. Voice recognition is usually quite accurate but there are transcription errors that can and very often do occur. I apologize for any typographical errors that were not detected and corrected.      [name], MD 07/12/2019, 12:55 PM

## 2019-07-12 NOTE — Progress Notes (Deleted)
° ° °Gastroenterology Consultation ° °Referring Provider:     Boyce, Emily E, FNP °Primary Care Physician:  Boyce, Emily E, FNP °Primary Gastroenterologist:  Dr.      °Reason for Consultation:     Transfer of care with a history of liver transplant °      ° HPI:   °Alison [name] is a 43 y.o. y/o female referred for consultation & management of transfer of care with a history of liver transplant by Dr. Boyce, Emily E, FNP.  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  The patient has done well and has recently moved to [place] from [place].  The patient has had complications associated with her liver transplant including a myeloproliferative disorder (B-cell lymphoma).  The patient also has a history of having a renal lesion found that was reported to be negative for cancer on biopsy.  The patient also was reported to have a lump in her neck that was not evaluated prior to her moving away from [place].  The patient was last evaluated in December 2019 for medication refills in [place] by a gastroenterologist there.  The patient has been treated with CellCept 500 mg twice daily and Prograf 1 mg daily for her liver transplant.  In July 2019 the patient's liver enzymes were significantly elevated with a report of only a mild increase in the liver enzymes at her office visit in December 2019.  The patient's most recent blood work showed: ° °Component °    Latest Ref Rng & Units 05/28/2019 06/04/2019 06/11/2019 06/18/2019  °Albumin °    3.5 - 5.0 g/dL 3.8 4.3 3.8 3.8  °AST °    15 - 41 U/L 22 25 20 23  °ALT °    0 - 44 U/L 23 29 22 22  °Alkaline Phosphatase °    38 - 126 U/L 59 65 60 51  ° °Component °    Latest Ref Rng & Units 07/09/2019  °Albumin °    3.5 - 5.0 g/dL 3.4 (L)  °AST °    15 - 41 U/L 23  °ALT °    0 - 44 U/L 23  °Alkaline Phosphatase °    38 - 126 U/L 55  ° °The patient does have mild thrombocytopenia with the last  count being 146.  The patient's creatinine was also normal. ° °Past Medical History:  °Diagnosis Date  °• Abnormal uterine bleeding   °• Allergy   °• Anxiety   °• Arthritis   °• Bipolar disorder (manic depression) (HCC)   °• Chronic kidney failure   °• Chronic renal disease, stage III   °• Diabetes mellitus without complication (HCC)   °• 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HCC)   °• FH: trigeminal neuralgia   °• GERD (gastroesophageal reflux disease)   °• Heart murmur   °• Hypertension   °• Kidney mass   °• Lupus (HCC)   °• Lupus (HCC)   °• lymphoma   °• Lymphoma (HCC)   °• Lymphoma (HCC)   °• Major depressive disorder   °• Migraine   °• Morbid obesity (HCC)   °• Neuromuscular disorder (HCC)   ° neuropathy  °• Neuropathy   °• Personality disorder (HCC)   °• Post herpetic neuralgia   °• PTSD (post-traumatic stress disorder)   °• Renal disorder   °• S/P liver transplant (HCC)   ° ° °Past Surgical History:  °  Procedure Laterality Date  °• BONE MARROW BIOPSY  01/13/2015  °• BREAST BIOPSY  12/2014  °• BREAST BIOPSY  2011  °• BREAST SURGERY    °• CHOLECYSTECTOMY    °• HERNIA REPAIR    °• infusaport    °• LIVER TRANSPLANT  12/17/1991  °• LUMBAR PUNCTURE    °• PORT A CATH INJECTION ([facility] HX)    °• tumor removal  2015  ° ° °Prior to Admission medications   °Medication Sig Start Date End Date Taking? Authorizing Provider  °baclofen (LIORESAL) 20 MG tablet Take 1 tablet (20 mg total) by mouth 3 (three) times daily. 07/05/19   Lateef, Bilal, MD  °Cranberry 500 MG TABS Take by mouth.    [provider]  °hydrOXYzine (VISTARIL) 25 MG capsule Take 1 capsule (25 mg total) by mouth 2 (two) times daily as needed. For sever anxiety attacks 06/10/19   [name], [name], MD  °lisinopril (ZESTRIL) 20 MG tablet Take 1 tablet (20 mg total) by mouth 1 day or 1 dose. 04/05/19   Boyce, Emily E, FNP  °LORazepam (ATIVAN) 1 MG tablet Take 1 tablet by mouth at bedtime as needed.    [provider]  °metFORMIN (GLUCOPHAGE) 1000 MG  tablet Take 1 tablet by mouth 2 (two) times a day.    [provider]  °mycophenolate (CELLCEPT) 250 MG capsule Take 1 capsule by mouth 2 (two) times a day.    [provider]  °pregabalin (LYRICA) 200 MG capsule Take 1 capsule (200 mg total) by mouth 3 (three) times daily. 07/05/19 10/03/19  Lateef, Bilal, MD  °promethazine (PHENERGAN) 25 MG tablet Take 1 tablet by mouth as needed.    [provider]  °QUEtiapine (SEROQUEL XR) 50 MG TB24 24 hr tablet Take 1 tablet (50 mg total) by mouth at bedtime. 06/10/19   [name], [name], MD  °tacrolimus (PROGRAF) 1 MG capsule Take 1 capsule by mouth once daily 07/08/19   Boyce, Emily E, FNP  °tiZANidine (ZANAFLEX) 4 MG tablet Take 1 tablet (4 mg total) by mouth 2 (two) times daily as needed for muscle spasms. 07/05/19 07/04/20  Lateef, Bilal, MD  ° ° °Family History  °Problem Relation Age of Onset  °• Heart disease Mother   °• Hypertension Mother   °• Cancer - Other Mother   °• Bipolar disorder Mother   °• Heart disease Father   °• Hypertension Father   °• Diabetes Father   °• Parkinson's disease Maternal Grandmother   °• Cancer Maternal Aunt   °• Cancer Maternal Uncle   °• Cancer Maternal Grandfather   °• Lupus Paternal Grandmother   °• Hypertension Brother   °  ° °Social History  ° °Tobacco Use  °• Smoking status: Never Smoker  °• Smokeless tobacco: Never Used  °Substance Use Topics  °• Alcohol use: Not Currently  °  Frequency: Never  °• Drug use: Not Currently  °  Types: Marijuana  °  Comment: pain managment last used in early april  ° ° °Allergies as of 07/12/2019 - Review Complete 07/08/2019  °Allergen Reaction Noted  °• Carbamazepine Other (See Comments) 05/18/2019  °• Hydrocodone-acetaminophen Itching 05/18/2019  °• Naproxen Itching 05/18/2019  °• Penicillin g Itching and Rash 03/05/2019  ° ° °Review of Systems:    °All systems reviewed and negative except where noted in HPI. ° ° Physical Exam:  °LMP 02/04/2018 (Approximate) Comment: Per patient,  over a year ago °Patient's last menstrual period was 02/04/2018 (approximate). °General:   Alert,    Well-developed, well-nourished, pleasant and cooperative in NAD °Head:  Normocephalic and atraumatic. °Eyes:  Sclera clear, no icterus.   Conjunctiva pink. °Ears:  Normal auditory acuity. °Nose:  No deformity, discharge, or lesions. °Mouth:  No deformity or lesions,oropharynx pink & moist. °Neck:  Supple; no masses or thyromegaly. °Lungs:  Respirations even and unlabored.  Clear throughout to auscultation.   No wheezes, crackles, or rhonchi. No acute distress. °Heart:  Regular rate and rhythm; no murmurs, clicks, rubs, or gallops. °Abdomen:  Normal bowel sounds.  No bruits.  Soft, non-tender and non-distended without masses, hepatosplenomegaly or hernias noted.  No guarding or rebound tenderness.  Negative Carnett sign.   °Rectal:  Deferred.  °Msk:  Symmetrical without gross deformities.  Good, equal movement & strength bilaterally. °Pulses:  Normal pulses noted. °Extremities:  No clubbing or edema.  No cyanosis. °Neurologic:  Alert and oriented x3;  grossly normal neurologically. °Skin:  Intact without significant lesions or rashes.  No jaundice. °Lymph Nodes:  No significant cervical adenopathy. °Psych:  Alert and cooperative. Normal mood and affect. ° °Imaging Studies: °Nm Pet Image Restag (ps) Skull Base To Thigh ° °Result Date: 07/08/2019 °CLINICAL DATA:  Subsequent treatment strategy for lymphoma. EXAM: NUCLEAR MEDICINE PET SKULL BASE TO THIGH TECHNIQUE: 13.5 mCi F-18 FDG was injected intravenously. Full-ring PET imaging was performed from the skull base to thigh after the radiotracer. CT data was obtained and used for attenuation correction and anatomic localization. Fasting blood glucose: 126 mg/dl COMPARISON:  04/07/2019 FINDINGS: Mediastinal blood pool activity: SUV max 3.83 Liver activity: SUV max 5.15 NECK: There is been interval resolution of previous FDG avid lesions in the parotid glands bilaterally.  Incidental CT findings: None CHEST: No hypermetabolic supraclavicular, axillary, mediastinal, or hilar lymph nodes. No hypermetabolic pulmonary nodule or mass. Left circumflex coronary artery atherosclerotic calcification. Incidental CT findings: none ABDOMEN/PELVIS: No abnormal hypermetabolic activity within the liver, pancreas, adrenal glands, or spleen. No hypermetabolic lymph nodes in the abdomen or pelvis. No diffuse or focal uptake within the spleen. The spleen is normal in size measuring 9.6 cm. Incidental CT findings: Status post right lateral abdominal wall herniorrhaphy. Along the medial margin of the hernia mesh is a hernia which contains fat only measuring 8.8 cm transverse. Mild aortic atherosclerosis. Left kidney scarring and atrophy. SKELETON: No focal hypermetabolic activity to suggest skeletal metastasis. Incidental CT findings: none IMPRESSION: 1. Interval resolution of FDG avid parotid lesions compatible with complete response to therapy. No new foci of increased uptake identified. 2. Aortic Atherosclerosis (ICD10-I70.0). Coronary artery calcification noted. 3. Status post right lateral abdominal wall herniorrhaphy. Along the medial margin of the hernia mesh is a 8.8 cm fat containing abdominal wall hernia. Electronically Signed   By: Taylor  Stroud M.D.   On: 07/08/2019 13:11  ° ° °Assessment and Plan:  ° °Alison [name] is a 43 y.o. y/o female *** ° ° , MD. FACG ° ° ° Note: This dictation was prepared with Dragon dictation along with smaller phrase technology. Any transcriptional errors that result from this process are unintentional.   °

## 2019-07-13 ENCOUNTER — Other Ambulatory Visit: Payer: Self-pay | Admitting: Family Medicine

## 2019-07-13 DIAGNOSIS — R350 Frequency of micturition: Secondary | ICD-10-CM

## 2019-07-13 DIAGNOSIS — N3001 Acute cystitis with hematuria: Secondary | ICD-10-CM

## 2019-07-13 NOTE — Telephone Encounter (Signed)
Requested medications are due for refill today?  No  Requested medications are on the active medication list?  No  Last refill N/A  Future visit scheduled? Yes  Notes to clinic   Requested Prescriptions  Pending Prescriptions Disp Refills   sulfamethoxazole-trimethoprim (BACTRIM DS) 800-160 MG tablet [Pharmacy Med Name: Sulfamethoxazole-Trimethoprim 800-160 MG Oral Tablet] 6 tablet 0    Sig: TAKE 1 TABLET BY MOUTH TWICE DAILY FOR 3 DAYS     Off-Protocol Failed - 07/13/2019  5:52 PM      Failed - Medication not assigned to a protocol, review manually.      Passed - Valid encounter within last 12 months    Recent Outpatient Visits          3 weeks ago Frequency of urination   [place], FNP   1 month ago Abnormal vaginal bleeding in postmenopausal patient   [place], FNP   3 months ago [place], [place]      Future Appointments            In 2 months [name], [facility], [facility]

## 2019-07-14 DIAGNOSIS — G500 Trigeminal neuralgia: Secondary | ICD-10-CM | POA: Diagnosis not present

## 2019-07-15 DIAGNOSIS — Z5181 Encounter for therapeutic drug level monitoring: Secondary | ICD-10-CM | POA: Diagnosis not present

## 2019-07-15 DIAGNOSIS — F3130 Bipolar disorder, current episode depressed, mild or moderate severity, unspecified: Secondary | ICD-10-CM | POA: Diagnosis not present

## 2019-07-15 DIAGNOSIS — Z944 Liver transplant status: Secondary | ICD-10-CM | POA: Diagnosis not present

## 2019-07-15 DIAGNOSIS — F4310 Post-traumatic stress disorder, unspecified: Secondary | ICD-10-CM | POA: Diagnosis not present

## 2019-07-16 LAB — THYROID PANEL WITH TSH
Free Thyroxine Index: 1.5 (ref 1.2–4.9)
T3 Uptake Ratio: 24 % (ref 24–39)
T4, Total: 6.4 ug/dL (ref 4.5–12.0)
TSH: 2.65 u[IU]/mL (ref 0.450–4.500)

## 2019-07-16 LAB — HEMOGLOBIN A1C
Est. average glucose Bld gHb Est-mCnc: 143 mg/dL
Hgb A1c MFr Bld: 6.6 % — ABNORMAL HIGH (ref 4.8–5.6)

## 2019-07-16 LAB — PROLACTIN: Prolactin: 13.4 ng/mL (ref 4.8–23.3)

## 2019-07-19 ENCOUNTER — Encounter: Payer: Self-pay | Admitting: Family Medicine

## 2019-07-19 DIAGNOSIS — R300 Dysuria: Secondary | ICD-10-CM

## 2019-07-19 MED ORDER — NITROFURANTOIN MONOHYD MACRO 100 MG PO CAPS: 100.0000 mg | ORAL_CAPSULE | Freq: Two times a day (BID) | ORAL | 0 refills | Status: AC

## 2019-07-21 DIAGNOSIS — G43119 Migraine with aura, intractable, without status migrainosus: Secondary | ICD-10-CM | POA: Diagnosis not present

## 2019-07-26 DIAGNOSIS — G5602 Carpal tunnel syndrome, left upper limb: Secondary | ICD-10-CM | POA: Insufficient documentation

## 2019-07-26 DIAGNOSIS — G5622 Lesion of ulnar nerve, left upper limb: Secondary | ICD-10-CM | POA: Diagnosis not present

## 2019-07-27 ENCOUNTER — Encounter: Payer: Self-pay | Admitting: Family Medicine

## 2019-07-28 ENCOUNTER — Encounter: Payer: Self-pay | Admitting: Family Medicine

## 2019-07-28 DIAGNOSIS — G519 Disorder of facial nerve, unspecified: Secondary | ICD-10-CM

## 2019-07-28 DIAGNOSIS — G629 Polyneuropathy, unspecified: Secondary | ICD-10-CM

## 2019-07-28 DIAGNOSIS — F4310 Post-traumatic stress disorder, unspecified: Secondary | ICD-10-CM

## 2019-07-28 DIAGNOSIS — M5116 Intervertebral disc disorders with radiculopathy, lumbar region: Secondary | ICD-10-CM

## 2019-07-28 DIAGNOSIS — F3130 Bipolar disorder, current episode depressed, mild or moderate severity, unspecified: Secondary | ICD-10-CM

## 2019-07-28 DIAGNOSIS — G43119 Migraine with aura, intractable, without status migrainosus: Secondary | ICD-10-CM | POA: Diagnosis not present

## 2019-07-28 DIAGNOSIS — M1990 Unspecified osteoarthritis, unspecified site: Secondary | ICD-10-CM

## 2019-07-28 DIAGNOSIS — M797 Fibromyalgia: Secondary | ICD-10-CM

## 2019-07-29 ENCOUNTER — Telehealth: Payer: Self-pay

## 2019-07-29 NOTE — Telephone Encounter (Signed)
pt called left message that she call states that the phone numbers you gave her they either did not take her insurance or they were not taking patients at this time.

## 2019-07-29 NOTE — Telephone Encounter (Signed)
pt was called and left a message that the best way to find someone was to contact her insurance and find out who was in network. and sometime the insurance has a resource to help you get appt set up.

## 2019-07-30 ENCOUNTER — Encounter: Payer: Self-pay | Admitting: Family Medicine

## 2019-07-30 ENCOUNTER — Other Ambulatory Visit: Payer: Self-pay

## 2019-07-30 ENCOUNTER — Ambulatory Visit: Payer: Medicaid Other | Admitting: Family Medicine

## 2019-07-30 VITALS — BP 128/70 | HR 100 | Temp 97.5°F | Resp 16 | Ht 67.0 in | Wt 261.0 lb

## 2019-07-30 DIAGNOSIS — R399 Unspecified symptoms and signs involving the genitourinary system: Secondary | ICD-10-CM

## 2019-07-30 DIAGNOSIS — R32 Unspecified urinary incontinence: Secondary | ICD-10-CM | POA: Diagnosis not present

## 2019-07-30 LAB — POCT URINALYSIS DIPSTICK
Bilirubin, UA: NEGATIVE
Glucose, UA: NEGATIVE
Ketones, UA: NEGATIVE
Leukocytes, UA: NEGATIVE
Nitrite, UA: NEGATIVE
Odor: NEGATIVE
Protein, UA: POSITIVE — AB
Spec Grav, UA: 1.015 (ref 1.010–1.025)
Urobilinogen, UA: NEGATIVE E.U./dL — AB
pH, UA: 5 (ref 5.0–8.0)

## 2019-07-30 NOTE — Progress Notes (Signed)
Name: Alison [name]   MRN: [id number]    DOB: [birth year]   Date:07/30/2019       Progress Note  Subjective  Chief Complaint  Chief Complaint  Patient presents with  . Urinary Tract Infection    itching, burning, frequency    HPI  PT presents with concern for possible UTI.  She notes symptoms started about 4 days ago, with symptoms worsening yesterday, then improving today.  She was having low pelvic pain, urinary urgency, had 3 episodes of incontinence overnight, dysuria.  No frank hematuria; has had kidney stones in the past, no back pain, N/V.  She has had recurrent UTI's and UTI symptoms; she also has had some urinary incontinence.  We will refer to urology for further evaluation today for incontinence and recurrent UTI symptoms.   FMS: She is having constant FMS pain; she is research FMS providers in the area - has one picked out, will bring referral paperwork to [place] to complete for her.   Patient Active Problem List   Diagnosis Date Noted  . Bipolar I disorder, most recent episode depressed ([place]) 07/12/2019  . PTSD (post-traumatic stress disorder) 07/12/2019  . Umbilical hernia without obstruction and without gangrene 06/17/2019  . Encounter for surveillance of abnormal nevi 06/17/2019  . Pineal gland cyst 06/17/2019  . Chronic hip pain, left 06/02/2019  . Lumbar spondylosis 06/02/2019  . Mouth dryness 06/02/2019  . Obesity (BMI 35.0-39.9 without comorbidity) 06/02/2019  . Goals of care, counseling/discussion 05/18/2019  . 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[place]) 05/18/2019  . Occipital neuralgia 05/13/2019  . Plantar fasciitis of left foot 04/27/2019  . Fibromyalgia 03/23/2019  . Systemic lupus erythematosus (SLE) in adult ([place]) 03/23/2019  . Essential hypertension 03/23/2019  . Hepatitis 03/23/2019  . Mass of left kidney 03/23/2019  . Diabetes mellitus ([place])  03/23/2019  . Nausea without vomiting 03/23/2019  . Neuropathy 03/23/2019  . Cancer ([place]) 03/23/2019  . Hyperlipidemia 03/23/2019  . Osteoarthritis 03/23/2019  . Trigeminal neuralgia 03/23/2019  . Chronic renal disease, stage III 03/23/2019  . Nephrolithiasis 03/23/2019  . Migraine 03/23/2019  . OSA on CPAP 03/23/2019  . Fibrocystic breast 03/23/2019  . Heart murmur 03/23/2019  . Bipolar disorder (manic depression) ([place]) 07/02/2017  . Abnormal uterine bleeding 03/18/2017  . Major depressive disorder, recurrent ([place]) 03/18/2017  . Morbid obesity ([place]) 03/18/2017  . Secondary hyperparathyroidism ([place]) 02/07/2017  . Neuropathic pain 11/19/2016  . Post herpetic neuralgia 11/19/2016  . 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[place]) 01/13/2015  . Lumbar disc disease with radiculopathy 07/26/2013  . S/P liver transplant ([place]) 07/26/2013  . Type 2 diabetes mellitus, uncontrolled ([place]) 07/26/2013  . Facial nerve disorder 06/29/2012  . Low back pain 12/26/2010    Social History   Tobacco Use  . Smoking status: Never Smoker  . Smokeless tobacco: Never Used  Substance Use Topics  . Alcohol use: Not Currently    Frequency: Never     Current Outpatient Medications:  .  baclofen (LIORESAL) 20 MG tablet, Take 1 tablet (20 mg total) by mouth 3 (three) times daily., Disp: 30 each, Rfl: 2 .  Cranberry 500 MG TABS, Take by mouth., Disp: , Rfl:  .  hydrOXYzine (VISTARIL) 25 MG capsule, Take 1 capsule (25 mg total) by mouth 2 (two) times daily as needed. For sever anxiety attacks, Disp: 60 capsule, Rfl: 1 .  lisinopril (ZESTRIL) 20 MG tablet, Take 1 tablet (  20 mg total) by mouth 1 day or 1 dose., Disp: 90 tablet, Rfl: 1 .  LORazepam (ATIVAN) 1 MG tablet, Take 1 tablet by mouth at bedtime as needed., Disp: , Rfl:  .  metFORMIN (GLUCOPHAGE) 1000 MG tablet, Take 1 tablet by mouth 2 (two) times a day., Disp: , Rfl:  .  mycophenolate (CELLCEPT) 250 MG capsule, Take 1 capsule by mouth 2 (two) times a  day., Disp: , Rfl:  .  pregabalin (LYRICA) 200 MG capsule, Take 1 capsule (200 mg total) by mouth 3 (three) times daily., Disp: 90 capsule, Rfl: 2 .  promethazine (PHENERGAN) 25 MG tablet, Take 1 tablet by mouth as needed., Disp: , Rfl:  .  QUEtiapine (SEROQUEL XR) 50 MG TB24 24 hr tablet, Take 2 tablets (100 mg total) by mouth at bedtime., Disp: 180 tablet, Rfl: 0 .  Specialty Vitamins Products (MAGNESIUM, AMINO ACID CHELATE,) 133 MG tablet, Take 1 tablet by mouth 2 (two) times daily., Disp: , Rfl:  .  SUMAtriptan (IMITREX) 100 MG tablet, TAKE 1 TABLET BY MOUTH ONCE AS NEEDED FOR MIGRAINE. MAY TAKE A SECOND DOSE AFTER 2 HOURS IF NEEDED, Disp: , Rfl:  .  tacrolimus (PROGRAF) 1 MG capsule, Take 1 capsule by mouth once daily, Disp: 30 capsule, Rfl: 0 .  tiZANidine (ZANAFLEX) 4 MG tablet, Take 1 tablet (4 mg total) by mouth 2 (two) times daily as needed for muscle spasms., Disp: 30 tablet, Rfl: 2  Allergies  Allergen Reactions  . Carbamazepine Other (See Comments)    Medication interaction-prograf  . Hydrocodone-Acetaminophen Itching  . Naproxen Itching  . Penicillin G Itching and Rash    I personally reviewed active problem list, medication list, allergies, health maintenance, notes from last encounter, lab results with the patient/caregiver today.  ROS  Constitutional: Negative for fever or weight change.  Respiratory: Negative for cough and shortness of breath.   Cardiovascular: Negative for chest pain or palpitations.  Gastrointestinal: Negative for abdominal pain, no bowel changes.  Musculoskeletal: Negative for gait problem or joint swelling.  Skin: Negative for rash.  Neurological: Negative for dizziness or headache.  No other specific complaints in a complete review of systems (except as listed in HPI above).  Objective  Vitals:   07/30/19 0837  BP: 128/70  Pulse: 100  Resp: 16  Temp: (!) 97.5 F (36.4 C)  TempSrc: Temporal  SpO2: 98%  Weight: 261 lb (118.4 kg)   Height: 5' 7"  (1.702 m)    Body mass index is 40.88 kg/m.  Nursing Note and Vital Signs reviewed.  Physical Exam  Constitutional: Patient appears well-developed and well-nourished. Obese. No distress.  HEENT: head atraumatic, normocephalic Cardiovascular: Normal rate, regular rhythm and normal heart sounds.  No murmur heard. No BLE edema. Pulmonary/Chest: Effort normal and breath sounds clear bilaterally. No respiratory distress. Abdominal: Soft, bowel sounds normal, there is no tenderness, no HSM, no CVA tenderness Psychiatric: Patient has a normal mood and affect. behavior is normal. Judgment and thought content normal.  Results for orders placed or performed in visit on 07/30/19 (from the past 72 hour(s))  POCT urinalysis dipstick     Status: Abnormal   Collection Time: 07/30/19  8:43 AM  Result Value Ref Range   Color, UA yellow    Clarity, UA clear    Glucose, UA Negative Negative   Bilirubin, UA negative    Ketones, UA negative    Spec Grav, UA 1.015 1.010 - 1.025   Blood, UA trace  pH, UA 5.0 5.0 - 8.0   Protein, UA Positive (A) Negative   Urobilinogen, UA negative (A) 0.2 or 1.0 E.U./dL   Nitrite, UA negative    Leukocytes, UA Negative Negative   Appearance clear    Odor negatiive     Assessment & Plan  1. UTI symptoms - POCT urinalysis dipstick - Urine Culture - Ambulatory referral to Urology - We will wait to treat until culture is returned.   2. Urinary incontinence, unspecified type - Urine Culture - Ambulatory referral to Urology  -Red flags and when to present for emergency care or RTC including fever >101.31F, chest pain, shortness of breath, new/worsening/un-resolving symptoms, reviewed with patient at time of visit. Follow up and care instructions discussed and provided in AVS.

## 2019-07-31 LAB — URINE CULTURE
MICRO NUMBER:: 1024363
SPECIMEN QUALITY:: ADEQUATE

## 2019-08-03 ENCOUNTER — Encounter: Payer: Self-pay | Admitting: Family Medicine

## 2019-08-06 DIAGNOSIS — Z6841 Body Mass Index (BMI) 40.0 and over, adult: Secondary | ICD-10-CM | POA: Diagnosis not present

## 2019-08-06 DIAGNOSIS — E119 Type 2 diabetes mellitus without complications: Secondary | ICD-10-CM | POA: Diagnosis not present

## 2019-08-06 DIAGNOSIS — N1831 Chronic kidney disease, stage 3a: Secondary | ICD-10-CM | POA: Diagnosis not present

## 2019-08-09 NOTE — Telephone Encounter (Signed)
See other related telephone encounter.

## 2019-08-10 ENCOUNTER — Ambulatory Visit: Payer: Self-pay | Admitting: *Deleted

## 2019-08-10 ENCOUNTER — Encounter: Payer: Self-pay | Admitting: *Deleted

## 2019-08-10 DIAGNOSIS — F3130 Bipolar disorder, current episode depressed, mild or moderate severity, unspecified: Secondary | ICD-10-CM

## 2019-08-10 DIAGNOSIS — F4310 Post-traumatic stress disorder, unspecified: Secondary | ICD-10-CM

## 2019-08-11 ENCOUNTER — Ambulatory Visit (INDEPENDENT_AMBULATORY_CARE_PROVIDER_SITE_OTHER): Payer: Medicaid Other | Admitting: Psychiatry

## 2019-08-11 ENCOUNTER — Other Ambulatory Visit: Payer: Self-pay

## 2019-08-11 ENCOUNTER — Encounter: Payer: Self-pay | Admitting: Psychiatry

## 2019-08-11 DIAGNOSIS — F4310 Post-traumatic stress disorder, unspecified: Secondary | ICD-10-CM | POA: Diagnosis not present

## 2019-08-11 DIAGNOSIS — F3130 Bipolar disorder, current episode depressed, mild or moderate severity, unspecified: Secondary | ICD-10-CM | POA: Diagnosis not present

## 2019-08-11 MED ORDER — QUETIAPINE FUMARATE ER 150 MG PO TB24: 150.0000 mg | ORAL_TABLET | Freq: Every day | ORAL | 0 refills | Status: DC

## 2019-08-11 MED ORDER — HYDROXYZINE PAMOATE 25 MG PO CAPS: 25.0000 mg | ORAL_CAPSULE | Freq: Two times a day (BID) | ORAL | 1 refills | Status: DC | PRN

## 2019-08-11 NOTE — Chronic Care Management (AMB) (Signed)
Care Management    Clinical Social Work General Note  08/11/2019 Name: Alison [name] MRN: [id number] DOB: 04/24/1976  Alison [name] is a 43 y.o. year old female who is a primary care patient of Alison Hartshorn, FNP. The CCM was consulted to assist the patient with Mental Health Counseling and Resources.   [name] was given information about Chronic Care Management services today including:  1. CCM service includes personalized support from designated clinical staff supervised by her physician, including individualized plan of care and coordination with other care providers 2. 24/7 contact phone numbers for assistance for urgent and routine care needs. 3. The patient may stop CCM services at any time (effective at the end of the month) by phone call to the office staff.   Patient agreed to services and verbal consent obtained.   Review of patient status, including review of consultants reports, relevant laboratory and other test results, and collaboration with appropriate care team members and the patient's provider was performed as part of comprehensive patient evaluation and provision of chronic care management services.    SDOH (Social Determinants of Health) screening performed today. See Care Plan Entry related to challenges with: Transportation Food Insecurity  Stress  Outpatient Encounter Medications as of 08/10/2019  Medication Sig  . baclofen (LIORESAL) 20 MG tablet Take 1 tablet (20 mg total) by mouth 3 (three) times daily.  . Cranberry 500 MG TABS Take by mouth.  [name] lisinopril (ZESTRIL) 20 MG tablet Take 1 tablet (20 mg total) by mouth 1 day or 1 dose.  [name] LORazepam (ATIVAN) 1 MG tablet Take 1 tablet by mouth at bedtime as needed.  . metFORMIN (GLUCOPHAGE) 1000 MG tablet Take 1 tablet by mouth 2 (two) times a day.  . mycophenolate (CELLCEPT) 250 MG capsule Take 1 capsule by mouth 2 (two) times a day.  . pregabalin (LYRICA) 200 MG capsule Take 1 capsule (200 mg total) by  mouth 3 (three) times daily.  . promethazine (PHENERGAN) 25 MG tablet Take 1 tablet by mouth as needed.  [name] Specialty Vitamins Products (MAGNESIUM, AMINO ACID CHELATE,) 133 MG tablet Take 1 tablet by mouth 2 (two) times daily.  . SUMAtriptan (IMITREX) 100 MG tablet TAKE 1 TABLET BY MOUTH ONCE AS NEEDED FOR MIGRAINE. MAY TAKE A SECOND DOSE AFTER 2 HOURS IF NEEDED  . tacrolimus (PROGRAF) 1 MG capsule Take 1 capsule by mouth once daily  . tiZANidine (ZANAFLEX) 4 MG tablet Take 1 tablet (4 mg total) by mouth 2 (two) times daily as needed for muscle spasms.  . [DISCONTINUED] hydrOXYzine (VISTARIL) 25 MG capsule Take 1 capsule (25 mg total) by mouth 2 (two) times daily as needed. For sever anxiety attacks  . [DISCONTINUED] QUEtiapine (SEROQUEL XR) 50 MG TB24 24 hr tablet Take 2 tablets (100 mg total) by mouth at bedtime.   No facility-administered encounter medications on file as of 08/10/2019.     Goals Addressed            This Visit's Progress   . "I need help finding a mental health counselor"       Current Barriers:  . History of bipolar disorder, PTSD, history of borderline personality disorder, chronic pain from fibromyalgia and trigeminal neuralgia, history of liver transplant, B-cell lymphoma, SLE, diabetes melitis, hypertension, OSA, migraine headaches . Lacks knowledge of community resource: related to local mental health providers that accept her insurance . Suicidal Ideation/Homicidal Ideation: No  Clinical Social Work Goal(s):  [name] Over the next 90 days,  patient will follow up with the [facility] for ongoing mental health treatment* as directed by SW  Interventions: . Patient interviewed and appropriate assessments performed . Provided patient with information about the [facility], collaboration phone call completed to ensure that they accept patient's insurance . Advised patient to contact the [facility] to set up an intake appointment . Encouraged patient to  follow up with Dr. Shea Evans on 08/11/19 for medication management . Discussed plans with patient for ongoing care management follow up and provided patient with direct contact information for care management team . Supportive counseling and emotional support provided in regards to the loss of her mother, feelings of isolation due to move to [place] from [place], and struggles with chronic pain  . Reinforced the use of self care strategies discussed (swimming, show pony collection, journaling) Patient Self Care Activities:  . Performs ADL's independently . Performs IADL's independently  Patient Coping Strengths:  . Supportive Relationships . Able to Communicate Effectively  Patient Self Care Deficits:  [name] Knowledge deficit regarding local mental health providers that accept her insurance  Initial goal documentation         Follow Up Plan: SW will follow up with patient by phone over the next 2 weeks       [place], [place] Worker  [facility] Care Management [phone]

## 2019-08-11 NOTE — Patient Instructions (Addendum)
Thank you allowing the Chronic Care Management Team to be a part of your care! It was a pleasure speaking with you today!  1. Please call the [facility] to schedule your initial appointment [phone] 2. Please call this social worker with any questions or concerns regarding your mental health needs.  CCM (Chronic Care Management) Team   Neldon Labella RN, [name] Nurse Care Coordinator  [phone]  Ruben Reason PharmD  Clinical Pharmacist  [phone]   Elliot Gurney, LCSW Clinical Social Worker [phone]  Goals Addressed            This Visit's Progress   . "I need help finding a mental health counselor"       Current Barriers:  . History of bipolar disorder, PTSD, history of borderline personality disorder, chronic pain from fibromyalgia and trigeminal neuralgia, history of liver transplant, B-cell lymphoma, SLE, diabetes melitis, hypertension, OSA, migraine headaches . Lacks knowledge of community resource: related to local mental health providers that accept her insurance . Suicidal Ideation/Homicidal Ideation: No  Clinical Social Work Goal(s):  [name] Over the next 90 days, patient will follow up with the [facility] for ongoing mental health treatment* as directed by SW  Interventions: . Patient interviewed and appropriate assessments performed . Provided patient with information about the [facility], collaboration phone call completed to ensure that they accept patient's insurance . Advised patient to contact the [facility] to set up an intake appointment . Encouraged patient to follow up with Dr. Shea Evans on 08/11/19 for medication management . Discussed plans with patient for ongoing care management follow up and provided patient with direct contact information for care management team . Supportive counseling and emotional support provided in regards to the loss of her mother, feelings of isolation due to move to [place] from [place], and  struggles with chronic pain  . Reinforced the use of self care strategies discussed (swimming, show pony collection, journaling) Patient Self Care Activities:  . Performs ADL's independently . Performs IADL's independently  Patient Coping Strengths:  . Supportive Relationships . Able to Communicate Effectively  Patient Self Care Deficits:  [name] Knowledge deficit regarding local mental health providers that accept her insurance  Initial goal documentation         The patient verbalized understanding of instructions provided today and declined a print copy of patient instruction materials.   Telephone follow up appointment with care management team member scheduled for:08/17/19

## 2019-08-11 NOTE — Progress Notes (Signed)
Virtual Visit via Video Note  I connected with Alison [name] on 08/11/19 at  2:30 PM EST by a video enabled telemedicine application and verified that I am speaking with the correct person using two identifiers.   I discussed the limitations of evaluation and management by telemedicine and the availability of in person appointments. The patient expressed understanding and agreed to proceed.    I discussed the assessment and treatment plan with the patient. The patient was provided an opportunity to ask questions and all were answered. The patient agreed with the plan and demonstrated an understanding of the instructions.   The patient was advised to call back or seek an in-person evaluation if the symptoms worsen or if the condition fails to improve as anticipated.   [place] MD OP Progress Note  08/11/2019 5:47 PM Alison [name]  MRN:  [id number]  Chief Complaint:  Chief Complaint    Follow-up     HPI: Alison [name] is a 43 year old female, Caucasian, lives in [place], originally from [place] was evaluated by telemedicine today.  She has a history of bipolar disorder, PTSD, history of borderline personality disorder, chronic pain from fibromyalgia and trigeminal neuralgia, history of liver transplant, B-cell lymphoma, SLE, diabetes melitis, hypertension, OSA, migraine headaches.  Patient today reports she continues to struggle with some sadness.  She reports as the holidays are getting closer she struggles with the memory of her mother who passed away.  Patient was tearful during session today.  She reports her sleep has improved on the Seroquel.  However there are nights when her sleep is still restless.  Patient is interested in a dosage increase of her Seroquel.  Patient reports she has good support system from her fianc.  Patient denies any suicidality, homicidality or perceptual disturbances.  She reports she was able to schedule an appointment with therapist-at [facility]  in [place] at [phone].  Patient reports she is motivated to start psychotherapy sessions.   Visit Diagnosis:    ICD-10-CM   1. Bipolar I disorder, most recent episode depressed (HCC)  F31.30 QUEtiapine Fumarate (SEROQUEL XR) 150 MG 24 hr tablet  2. PTSD (post-traumatic stress disorder)  F43.10 hydrOXYzine (VISTARIL) 25 MG capsule    Past Psychiatric History: Reviewed past psychiatric history from my progress note on 06/10/2019.  Past trials of Klonopin.  Past Medical History:  Past Medical History:  Diagnosis Date  . Abnormal uterine bleeding   . Allergy   . Anxiety   . Arthritis   . Bipolar disorder (manic depression) ([place])   . Chronic kidney failure   . Chronic renal disease, stage III   . Diabetes mellitus without complication ([place])   . 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[place])   . FH: trigeminal neuralgia   . GERD (gastroesophageal reflux disease)   . Heart murmur   . Hypertension   . Kidney mass   . Lupus ([place])   . Lupus ([place])   . lymphoma   . Lymphoma ([place])   . Lymphoma ([place])   . Major depressive disorder   . Migraine   . Morbid obesity ([place])   . Neuromuscular disorder (HCC)    neuropathy  . Neuropathy   . Personality disorder ([place])   . Post herpetic neuralgia   . PTSD (post-traumatic stress disorder)   . Renal disorder   . S/P liver transplant [facility])     Past Surgical History:  Procedure Laterality Date  . BONE MARROW BIOPSY  01/13/2015  . BREAST BIOPSY  12/2014  .  BREAST BIOPSY  2011  . BREAST SURGERY    . CHOLECYSTECTOMY    . HERNIA REPAIR    . infusaport    . LIVER TRANSPLANT  12/17/1991  . LUMBAR PUNCTURE    . PORT A CATH INJECTION ([place] [facility])    . tumor removal  2015    Family Psychiatric History: Reviewed family psychiatric history from my progress note on 06/10/2019  Family History:  Family History  Problem Relation Age of Onset  . Heart disease Mother   . Hypertension Mother   . Cancer - Other Mother   . Bipolar disorder Mother   .  Heart disease Father   . Hypertension Father   . Diabetes Father   . Parkinson's disease Maternal Grandmother   . Cancer Maternal Aunt   . Cancer Maternal Uncle   . Cancer Maternal Grandfather   . Lupus Paternal Grandmother   . Hypertension Brother     Social History: Reviewed social history from my progress note on 06/10/2019 Social History   Socioeconomic History  . Marital status: Single    Spouse name: Not on file  . Number of children: 0  . Years of education: 9  . Highest education level: GED or equivalent  Occupational History  . Occupation: disabled  Social Needs  . Financial resource strain: Somewhat hard  . Food insecurity    Worry: Sometimes true    Inability: Sometimes true  . Transportation needs    Medical: Yes    Non-medical: Yes  Tobacco Use  . Smoking status: Never Smoker  . Smokeless tobacco: Never Used  Substance and Sexual Activity  . Alcohol use: Not Currently    Frequency: Never  . Drug use: Not Currently    Types: Marijuana    Comment: pain managment last used in early april  . Sexual activity: Not Currently  Lifestyle  . Physical activity    Days per week: 1 day    Minutes per session: 30 min  . Stress: Rather much  Relationships  . Social connections    Talks on phone: Once a week    Gets together: Three times a week    Attends religious service: More than 4 times per year    Active member of club or organization: No    Attends meetings of clubs or organizations: Never    Relationship status: Never married  Other Topics Concern  . Not on file  Social History Narrative  . Not on file    Allergies:  Allergies  Allergen Reactions  . Penicillin G Itching, Rash, Anaphylaxis and Palpitations  . Carbamazepine Other (See Comments)    Medication interaction-prograf  . Hydrocodone-Acetaminophen Itching  . Naproxen Itching    Metabolic Disorder Labs: Lab Results  Component Value Date   HGBA1C 6.6 (H) 07/15/2019   Lab Results   Component Value Date   PROLACTIN 13.4 07/15/2019   PROLACTIN 21.3 05/24/2019   No results found for: CHOL, TRIG, HDL, CHOLHDL, VLDL, LDLCALC Lab Results  Component Value Date   TSH 2.650 07/15/2019   TSH 4.650 (H) 05/24/2019    Therapeutic Level Labs: No results found for: LITHIUM No results found for: VALPROATE No components found for:  CBMZ  Current Medications: Current Outpatient Medications  Medication Sig Dispense Refill  . baclofen (LIORESAL) 20 MG tablet Take 1 tablet (20 mg total) by mouth 3 (three) times daily. 30 each 2  . Cranberry 500 MG TABS Take by mouth.    . hydrOXYzine (VISTARIL) 25  MG capsule Take 1 capsule (25 mg total) by mouth 2 (two) times daily as needed. For sever anxiety attacks 60 capsule 1  . lisinopril (ZESTRIL) 20 MG tablet Take 1 tablet (20 mg total) by mouth 1 day or 1 dose. 90 tablet 1  . LORazepam (ATIVAN) 1 MG tablet Take 1 tablet by mouth at bedtime as needed.    . Magnesium Bisglycinate (MAG GLYCINATE) 100 MG TABS Take by mouth.    . metFORMIN (GLUCOPHAGE) 1000 MG tablet Take 1 tablet by mouth 2 (two) times a day.    . mycophenolate (CELLCEPT) 250 MG capsule Take 1 capsule by mouth 2 (two) times a day.    . pregabalin (LYRICA) 200 MG capsule Take 1 capsule (200 mg total) by mouth 3 (three) times daily. 90 capsule 2  . promethazine (PHENERGAN) 25 MG tablet Take 1 tablet by mouth as needed.    [name] QUEtiapine Fumarate (SEROQUEL XR) 150 MG 24 hr tablet Take 1 tablet (150 mg total) by mouth at bedtime. 90 tablet 0  . Specialty Vitamins Products (MAGNESIUM, AMINO ACID CHELATE,) 133 MG tablet Take 1 tablet by mouth 2 (two) times daily.    . SUMAtriptan (IMITREX) 100 MG tablet TAKE 1 TABLET BY MOUTH ONCE AS NEEDED FOR MIGRAINE. MAY TAKE A SECOND DOSE AFTER 2 HOURS IF NEEDED    . tacrolimus (PROGRAF) 1 MG capsule Take 1 capsule by mouth once daily 30 capsule 0  . tiZANidine (ZANAFLEX) 4 MG tablet Take 1 tablet (4 mg total) by mouth 2 (two) times daily as  needed for muscle spasms. 30 tablet 2   No current facility-administered medications for this visit.      Musculoskeletal: Strength & Muscle Tone: UTA Gait & Station: Observed as seated Patient leans: N/A  Psychiatric Specialty Exam: Review of Systems  Psychiatric/Behavioral: Positive for depression. The patient has insomnia.   All other systems reviewed and are negative.   Last menstrual period 02/04/2018.There is no height or weight on file to calculate BMI.  General Appearance: Casual  Eye Contact:  Fair  Speech:  Clear and Coherent  Volume:  Normal  Mood:  Depressed  Affect:  Tearful  Thought Process:  Goal Directed and Descriptions of Associations: Intact  Orientation:  Full (Time, Place, and Person)  Thought Content: Logical   Suicidal Thoughts:  No  Homicidal Thoughts:  No  Memory:  Immediate;   Fair Recent;   Fair Remote;   Fair  Judgement:  Fair  Insight:  Fair  Psychomotor Activity:  Normal  Concentration:  Concentration: Fair and Attention Span: Fair  Recall:  [employer] of Knowledge: Fair  Language: Fair  Akathisia:  No  Handed:  Right  AIMS (if indicated): denies tremors, rigidity, does report occasional muscle cramps - not sure if due to medications  Assets:  Communication Skills Desire for Improvement Housing Social Support  ADL's:  Intact  Cognition: WNL  Sleep:  restless   Screenings: PHQ2-9     Chronic Care Management from 08/10/2019 in [facility] Office Visit from 07/30/2019 in [facility] Office Visit from 06/17/2019 in [facility] Office Visit from 05/20/2019 in [facility] Office Visit from 04/05/2019 in [facility]  PHQ-2 Total Score  4  0  _0 PHQ-9 Total Score  5  0  _1 Assessment and Plan: Alison [name] is a 43 year old Caucasian  female, engaged, on disability, has a history of bipolar disorder, PTSD, chronic pain from  fibromyalgia, trigeminal neuralgia, status post liver transplant, B-cell lymphoma, OSA, diabetes, hypertension was evaluated by telemedicine today.  Patient today continues to struggle with mood symptoms and sleep issues and will benefit from medication readjustment as well as psychotherapy sessions  Plan Bipolar disorder-improving Increase Seroquel XR to 150 mg p.o. nightly Patient does report occasional muscular cramps-she however reports she has noticed an improvement the past few days.  Will monitor closely.  PTSD-improving Hydroxyzine 25 mg p.o. twice daily as needed for anxiety attacks Patient to start psychotherapy sessions with [facility] at [place] [phone]  I have reviewed the following labs in E HR-hemoglobin A1c-elevated at 6.6-she will continue to work with her primary care provider, TSH-within normal limits, prolactin-13.4-within normal limit  Follow-up in clinic in  3 - 4 weeks or sooner if needed.  December 4 at 9 AM  I have spent atleast 15 minutes non  face to face with patient today. More than 50 % of the time was spent for psychoeducation and supportive psychotherapy and care coordination. This note was generated in part or whole with voice recognition software. Voice recognition is usually quite accurate but there are transcription errors that can and very often do occur. I apologize for any typographical errors that were not detected and corrected.       [name], MD 08/11/2019, 5:47 PM

## 2019-08-12 ENCOUNTER — Other Ambulatory Visit: Payer: Self-pay | Admitting: Family Medicine

## 2019-08-12 DIAGNOSIS — G8929 Other chronic pain: Secondary | ICD-10-CM

## 2019-08-12 DIAGNOSIS — G629 Polyneuropathy, unspecified: Secondary | ICD-10-CM

## 2019-08-12 DIAGNOSIS — M797 Fibromyalgia: Secondary | ICD-10-CM

## 2019-08-12 DIAGNOSIS — M47816 Spondylosis without myelopathy or radiculopathy, lumbar region: Secondary | ICD-10-CM

## 2019-08-12 DIAGNOSIS — G500 Trigeminal neuralgia: Secondary | ICD-10-CM

## 2019-08-12 DIAGNOSIS — M5116 Intervertebral disc disorders with radiculopathy, lumbar region: Secondary | ICD-10-CM

## 2019-08-16 DIAGNOSIS — H5213 Myopia, bilateral: Secondary | ICD-10-CM | POA: Diagnosis not present

## 2019-08-17 ENCOUNTER — Ambulatory Visit: Payer: Self-pay | Admitting: *Deleted

## 2019-08-17 ENCOUNTER — Encounter: Payer: Self-pay | Admitting: Family Medicine

## 2019-08-17 DIAGNOSIS — F3130 Bipolar disorder, current episode depressed, mild or moderate severity, unspecified: Secondary | ICD-10-CM

## 2019-08-17 DIAGNOSIS — F4310 Post-traumatic stress disorder, unspecified: Secondary | ICD-10-CM

## 2019-08-17 NOTE — Patient Instructions (Addendum)
Thank you allowing the Chronic Care Management Team to be a part of your care! It was a pleasure speaking with you today!  1. Please call this social worker with any questions or concerns regarding your community mental health needs.  CCM (Chronic Care Management) Team   Neldon Labella RN, [name] Nurse Care Coordinator  [phone]  Ruben Reason PharmD  Clinical Pharmacist  [phone]   Elliot Gurney, LCSW Clinical Social Worker [phone]  Goals Addressed            This Visit's Progress   . "I need help finding a mental health counselor"       Current Barriers:  . History of bipolar disorder, PTSD, history of borderline personality disorder, chronic pain from fibromyalgia and trigeminal neuralgia, history of liver transplant, B-cell lymphoma, SLE, diabetes melitis, hypertension, OSA, migraine headaches . Lacks knowledge of community resource: related to local mental health providers that accept her insurance . Suicidal Ideation/Homicidal Ideation: No  Clinical Social Work Goal(s):  [name] Over the next 90 days, patient will follow up with the [facility] for ongoing mental health treatment* as directed by SW  Interventions: . Patient interviewed and appropriate assessments performed . Confirmed that appointment with  the [facility] has been made for 08/20/19 . Confirmed patient's satisfaction with appointment made with the [facility] stating "I am glad that I have a safety net now" . Reinforced need and benefit to keep appointment with the [facility] to set up an intake appointment . Encouraged continued to follow up with Psychiatrist ,Dr. Shea Evans for medication management . Confirmed that adoptive sister will be moving closer to her which will reduce feelings of isolation . Continued to reinforce the use of self care strategies when she can(swimming, show pony collection, sewing, journaling)  Patient Self Care Activities:  . Performs ADL's  independently . Performs IADL's independently  Patient Coping Strengths:  . Supportive Relationships . Able to Communicate Effectively  Patient Self Care Deficits:  [name] Knowledge deficit regarding local mental health providers that accept her insurance  Please see past updates related to this goal by clicking on the "Past Updates" button in the selected goal          The patient verbalized understanding of instructions provided today and declined a print copy of patient instruction materials.   No further follow up required: patient to call this social worker with any questions or concerns regarding your mental health needs

## 2019-08-17 NOTE — Chronic Care Management (AMB) (Addendum)
Care Management    Clinical Social Work Follow Up Note  08/17/2019 Name: Alison [name] MRN: [id number] DOB: [birth year]  Alison [name] is a 43 y.o. year old female who is a primary care patient of Alison Hartshorn, FNP. The CCM team was consulted for assistance with Mental Health Counseling and Resources.   Review of patient status, including review of consultants reports, other relevant assessments, and collaboration with appropriate care team members and the patient's provider was performed as part of comprehensive patient evaluation and provision of chronic care management services.     Advanced Directives Status: <no information> See Care Plan for related entries.   Outpatient Encounter Medications as of 08/17/2019  Medication Sig  . baclofen (LIORESAL) 20 MG tablet Take 1 tablet (20 mg total) by mouth 3 (three) times daily.  . Cranberry 500 MG TABS Take by mouth.  . hydrOXYzine (VISTARIL) 25 MG capsule Take 1 capsule (25 mg total) by mouth 2 (two) times daily as needed. For sever anxiety attacks  . lisinopril (ZESTRIL) 20 MG tablet Take 1 tablet (20 mg total) by mouth 1 day or 1 dose.  [name] LORazepam (ATIVAN) 1 MG tablet Take 1 tablet by mouth at bedtime as needed.  . Magnesium Bisglycinate (MAG GLYCINATE) 100 MG TABS Take by mouth.  . metFORMIN (GLUCOPHAGE) 1000 MG tablet Take 1 tablet by mouth 2 (two) times a day.  . mycophenolate (CELLCEPT) 250 MG capsule Take 1 capsule by mouth 2 (two) times a day.  . pregabalin (LYRICA) 200 MG capsule Take 1 capsule (200 mg total) by mouth 3 (three) times daily.  . promethazine (PHENERGAN) 25 MG tablet Take 1 tablet by mouth as needed.  [name] QUEtiapine Fumarate (SEROQUEL XR) 150 MG 24 hr tablet Take 1 tablet (150 mg total) by mouth at bedtime.  [name] Specialty Vitamins Products (MAGNESIUM, AMINO ACID CHELATE,) 133 MG tablet Take 1 tablet by mouth 2 (two) times daily.  . SUMAtriptan (IMITREX) 100 MG tablet TAKE 1 TABLET BY MOUTH ONCE AS NEEDED FOR  MIGRAINE. MAY TAKE A SECOND DOSE AFTER 2 HOURS IF NEEDED  . tacrolimus (PROGRAF) 1 MG capsule Take 1 capsule by mouth once daily  . tiZANidine (ZANAFLEX) 4 MG tablet Take 1 tablet (4 mg total) by mouth 2 (two) times daily as needed for muscle spasms.   No facility-administered encounter medications on file as of 08/17/2019.      Goals Addressed            This Visit's Progress   . "I need help finding a mental health counselor"       Current Barriers:  . History of bipolar disorder, PTSD, history of borderline personality disorder, chronic pain from fibromyalgia and trigeminal neuralgia, history of liver transplant, B-cell lymphoma, SLE, diabetes melitis, hypertension, OSA, migraine headaches . Lacks knowledge of community resource: related to local mental health providers that accept her insurance . Suicidal Ideation/Homicidal Ideation: No  Clinical Social Work Goal(s):  [name] Over the next 90 days, patient will follow up with the Alison [name] for ongoing mental health treatment* as directed by SW  Interventions: . Patient interviewed and appropriate assessments performed . Confirmed that appointment with  the [facility] has been made for 08/20/19 . Confirmed patient's satisfaction with appointment made with the [facility] stating "I am glad that I have a safety net now" . Reinforced need and benefit to keep appointment with the [facility] to set up an intake appointment . Encouraged continued to follow up with Psychiatrist ,  Dr. Shea [name] for medication management . Confirmed that adoptive sister will be moving closer to her which will reduce feelings of isolation . Continued to reinforce the use of self care strategies when she can(swimming, show pony collection, sewing, journaling)  Patient Self Care Activities:  . Performs ADL's independently . Performs IADL's independently  Patient Coping Strengths:  . Supportive Relationships . Able to Communicate Effectively   Patient Self Care Deficits:  [name] Knowledge deficit regarding local mental health providers that accept her insurance  Please see past updates related to this goal by clicking on the "Past Updates" button in the selected goal          Follow Up Plan: Client will call this social worker with any additional community resource needs arise   Alison [name], [place] Worker  [facility] Care Management [phone]

## 2019-08-19 ENCOUNTER — Encounter: Payer: Self-pay | Admitting: Family Medicine

## 2019-08-19 DIAGNOSIS — H5213 Myopia, bilateral: Secondary | ICD-10-CM | POA: Diagnosis not present

## 2019-08-20 ENCOUNTER — Encounter: Payer: Self-pay | Admitting: Family Medicine

## 2019-08-20 DIAGNOSIS — F3181 Bipolar II disorder: Secondary | ICD-10-CM | POA: Diagnosis not present

## 2019-08-25 ENCOUNTER — Encounter: Payer: Self-pay | Admitting: Urology

## 2019-08-25 ENCOUNTER — Other Ambulatory Visit: Payer: Self-pay

## 2019-08-25 ENCOUNTER — Ambulatory Visit (INDEPENDENT_AMBULATORY_CARE_PROVIDER_SITE_OTHER): Payer: Medicaid Other | Admitting: Urology

## 2019-08-25 VITALS — BP 138/82 | HR 88 | Ht 67.0 in | Wt 264.0 lb

## 2019-08-25 DIAGNOSIS — F3181 Bipolar II disorder: Secondary | ICD-10-CM | POA: Diagnosis not present

## 2019-08-25 DIAGNOSIS — N3941 Urge incontinence: Secondary | ICD-10-CM | POA: Diagnosis not present

## 2019-08-25 DIAGNOSIS — N390 Urinary tract infection, site not specified: Secondary | ICD-10-CM | POA: Diagnosis not present

## 2019-08-25 DIAGNOSIS — N2889 Other specified disorders of kidney and ureter: Secondary | ICD-10-CM | POA: Diagnosis not present

## 2019-08-25 DIAGNOSIS — N3281 Overactive bladder: Secondary | ICD-10-CM

## 2019-08-25 LAB — BLADDER SCAN AMB NON-IMAGING

## 2019-08-25 LAB — URINALYSIS, COMPLETE
Bilirubin, UA: NEGATIVE
Glucose, UA: NEGATIVE
Ketones, UA: NEGATIVE
Leukocytes,UA: NEGATIVE
Nitrite, UA: NEGATIVE
RBC, UA: NEGATIVE
Specific Gravity, UA: 1.025 (ref 1.005–1.030)
Urobilinogen, Ur: 0.2 mg/dL (ref 0.2–1.0)
pH, UA: 6 (ref 5.0–7.5)

## 2019-08-25 LAB — MICROSCOPIC EXAMINATION: RBC, Urine: NONE SEEN /hpf (ref 0–2)

## 2019-08-25 NOTE — Progress Notes (Signed)
08/25/2019 10:57 AM   Alison [name] March 07, 1976 [id number]  Referring provider: [name], [place],  [place] [address]  Chief Complaint  Patient presents with  . Urinary Incontinence    HPI: 43 year old extremely complex female who presents today to establish care.  [place] was previously followed by urologist for recurrent UTIs and other issues.  She has a personal history of multiple medical comorbidities as listed below including history of lupus, liver transplant amongst others.  She reports that she was followed by urologist for a suspicious renal mass status post multiple biopsies.  She never required intervention for this.  She is not sure what the pathology was consistent with but she does not believe that it was cancerous.  We have requested records for this today.  In addition to the above, she has a personal history of recurrent urinary tract infections.  She was on suppressive antibiotics for period of time but no longer takes this.  She is treated as needed.  She reports that she was treated for urinary tract infection on 05/20/2019.  Urine culture grew E. coli which is essentially pansensitive other than only partially sensitive to fluoroquinolones.  She was treated with only 3 days antibiotics for reports that she really needs 10 days of antibiotics based on her history.  Her symptoms ultimately resolved.  She also reports today that she has severe urinary frequency and urge incontinence.  She will feel the urge to go and cannot get to the bathroom in time.  She often engages in toilet mapping.  She is not currently on any medications for this.  She does have a personal history of Sjogren's disease 2 and severe dry mouth.  She believes that part of her issues that she has to drink lots of water to help with her dry mouth which is contributing to her urinary symptoms.  She is also diabetic.   She takes cranberry tablets for UTI prevention.   PMH: Past Medical History:  Diagnosis Date  . Abnormal uterine bleeding   . Allergy   . Anxiety   . Arthritis   . Bipolar disorder (manic depression) ([place])   . Chronic kidney failure   . Chronic renal disease, stage III   . Diabetes mellitus without complication ([place])   . 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[place])   . FH: trigeminal neuralgia   . GERD (gastroesophageal reflux disease)   . Heart murmur   . Hypertension   . Kidney mass   . Lupus ([place])   . Lupus ([place])   . lymphoma   . Lymphoma ([place])   . Lymphoma ([place])   . Major depressive disorder   . Migraine   . Morbid obesity ([place])   . Neuromuscular disorder (HCC)    neuropathy  . Neuropathy   . Personality disorder ([place])   . Post herpetic neuralgia   . PTSD (post-traumatic stress disorder)   . Renal disorder   . S/P liver transplant [facility])     Surgical History: Past Surgical History:  Procedure Laterality Date  . BONE MARROW BIOPSY  01/13/2015  . BREAST BIOPSY  12/2014  . BREAST BIOPSY  2011  . BREAST SURGERY    . CHOLECYSTECTOMY    . HERNIA REPAIR    . infusaport    . LIVER TRANSPLANT  12/17/1991  . LUMBAR PUNCTURE    . PORT A CATH INJECTION ([place] [facility]X)    .  tumor removal  2015    Home Medications:  Allergies as of 08/25/2019      Reactions   Penicillin G Itching, Rash, Anaphylaxis, Palpitations   Carbamazepine Other (See Comments)   Medication interaction-prograf   Hydrocodone-acetaminophen Itching   Naproxen Itching      Medication List       Accurate as of August 25, 2019 11:59 PM. If you have any questions, ask your nurse or doctor.        baclofen 20 MG tablet Commonly known as: LIORESAL Take 1 tablet (20 mg total) by mouth 3 (three) times daily.   Cranberry 500 MG Tabs Take by mouth.   hydrOXYzine 25 MG capsule Commonly known as: Vistaril Take 1 capsule (25 mg total) by mouth 2 (two) times daily as needed. For sever anxiety  attacks   lisinopril 20 MG tablet Commonly known as: ZESTRIL Take 1 tablet (20 mg total) by mouth 1 day or 1 dose.   LORazepam 1 MG tablet Commonly known as: ATIVAN Take 1 tablet by mouth at bedtime as needed.   Mag Glycinate 100 MG Tabs Take by mouth.   magnesium (amino acid chelate) 133 MG tablet Take 1 tablet by mouth 2 (two) times daily.   metFORMIN 1000 MG tablet Commonly known as: GLUCOPHAGE Take 1 tablet by mouth 2 (two) times a day.   mycophenolate 250 MG capsule Commonly known as: CELLCEPT Take 1 capsule by mouth 2 (two) times a day.   pregabalin 200 MG capsule Commonly known as: LYRICA Take 1 capsule (200 mg total) by mouth 3 (three) times daily.   promethazine 25 MG tablet Commonly known as: PHENERGAN Take 1 tablet by mouth as needed.   QUEtiapine Fumarate 150 MG 24 hr tablet Commonly known as: SEROquel XR Take 1 tablet (150 mg total) by mouth at bedtime.   SUMAtriptan 100 MG tablet Commonly known as: IMITREX TAKE 1 TABLET BY MOUTH ONCE AS NEEDED FOR MIGRAINE. MAY TAKE A SECOND DOSE AFTER 2 HOURS IF NEEDED   tacrolimus 1 MG capsule Commonly known as: PROGRAF Take 1 capsule by mouth once daily   tiZANidine 4 MG tablet Commonly known as: Zanaflex Take 1 tablet (4 mg total) by mouth 2 (two) times daily as needed for muscle spasms.       Allergies:  Allergies  Allergen Reactions  . Penicillin G Itching, Rash, Anaphylaxis and Palpitations  . Carbamazepine Other (See Comments)    Medication interaction-prograf  . Hydrocodone-Acetaminophen Itching  . Naproxen Itching    Family History: Family History  Problem Relation Age of Onset  . Heart disease Mother   . Hypertension Mother   . Cancer - Other Mother   . Bipolar disorder Mother   . Heart disease Father   . Hypertension Father   . Diabetes Father   . Parkinson's disease Maternal Grandmother   . Cancer Maternal Aunt   . Cancer Maternal Uncle   . Cancer Maternal Grandfather   . Lupus  Paternal Grandmother   . Hypertension Brother     Social History:  reports that she has never smoked. She has never used smokeless tobacco. She reports previous alcohol use. She reports previous drug use. Drug: Marijuana.  ROS: UROLOGY Frequent Urination?: Yes Hard to postpone urination?: Yes Burning/pain with urination?: No Get up at night to urinate?: Yes Leakage of urine?: Yes Urine stream starts and stops?: No Trouble starting stream?: No Do you have to strain to urinate?: No Blood in urine?: No Urinary tract infection?:  No Sexually transmitted disease?: No Injury to kidneys or bladder?: No Painful intercourse?: No Weak stream?: No Currently pregnant?: No Vaginal bleeding?: No Last menstrual period?: N  Gastrointestinal Nausea?: No Vomiting?: No Indigestion/heartburn?: No Diarrhea?: No Constipation?: No  Constitutional Fever: No Night sweats?: No Weight loss?: No Fatigue?: No  Skin Skin rash/lesions?: No Itching?: No  Eyes Blurred vision?: No Double vision?: No  Ears/Nose/Throat Sore throat?: No Sinus problems?: No  Hematologic/Lymphatic Swollen glands?: No Easy bruising?: No  Cardiovascular Leg swelling?: No Chest pain?: No  Respiratory Cough?: No Shortness of breath?: No  Endocrine Excessive thirst?: No  Musculoskeletal Back pain?: No Joint pain?: No  Neurological Headaches?: No Dizziness?: No  Psychologic Depression?: Yes Anxiety?: No  Physical Exam: BP 138/82   Pulse 88   Ht _0  (1.702 m)   Wt 264 lb (119.7 kg)   LMP 02/04/2018 (Approximate) Comment: Per patient, over a year ago  BMI 41.35 kg/m   Constitutional:  Alert and oriented, No acute distress. HEENT: [place] AT, moist mucus membranes.  Trachea midline, no masses. Cardiovascular: No clubbing, cyanosis, or edema. Respiratory: Normal respiratory effort, no increased work of breathing. GI: Abdomen is soft, nontender, nondistended, obese Skin: No rashes, bruises or  suspicious lesions. Neurologic: Grossly intact, no focal deficits, moving all 4 extremities. Psychiatric: Normal mood and affect.  Laboratory Data: Lab Results  Component Value Date   WBC 7.6 07/12/2019   HGB 14.4 07/12/2019   HCT 42.2 07/12/2019   MCV 85.6 07/12/2019   PLT 156 07/12/2019    Lab Results  Component Value Date   CREATININE 0.90 07/12/2019    Lab Results  Component Value Date   TESTOSTERONE <3 (L) 05/24/2019    Lab Results  Component Value Date   HGBA1C 6.6 (H) 07/15/2019    Urinalysis Results for orders placed or performed in visit on 08/25/19  Microscopic Examination   URINE  Result Value Ref Range   WBC, UA 0-5 0 - 5 /hpf   RBC None seen 0 - 2 /hpf   Epithelial Cells (non renal) 0-10 0 - 10 /hpf   Bacteria, UA Few None seen/Few  Urinalysis, Complete  Result Value Ref Range   Specific Gravity, UA 1.025 1.005 - 1.030   pH, UA 6.0 5.0 - 7.5   Color, UA Yellow Yellow   Appearance Ur Clear Clear   Leukocytes,UA Negative Negative   Protein,UA 1+ (A) Negative/Trace   Glucose, UA Negative Negative   Ketones, UA Negative Negative   RBC, UA Negative Negative   Bilirubin, UA Negative Negative   Urobilinogen, Ur 0.2 0.2 - 1.0 mg/dL   Nitrite, UA Negative Negative   Microscopic Examination See below:   Bladder Scan (Post Void Residual) in office  Result Value Ref Range   Scan Result 0ML     Assessment & Plan:    1. Urge incontinence Urinary urgency and urge incontinence, severe  Behavioral modification was discussed at length which is challenging given her comorbidities  Given samples of Myrbetriq 50 mg x 1 month today, discussed possible side effects including blood pressure issues.  Prefer to avoid anticholinergics given her history of severe dry mouth from Sjogren's disease.  She is agreeable this plan.  Reassess symptoms at follow-up.  In the interim, we will request her records from urology.   - Urinalysis, Complete - Bladder Scan  (Post Void Residual) in office  2. OAB (overactive bladder) As above  3. Renal mass Unclear history, records requested  She may need  follow-up imaging  4. Recurrent UTI Personal history of recurrent urinary tract infections  Continue cranberry tablets and probiotics.  We will treat these as needed for the time being, advised to call if she has any signs or symptoms of infection.   Return in about 4 weeks (around 09/22/2019) for PA for symptoms recheck, sign release for urology records.  Hollice Espy, MD  [facility] [address], [place],  [address] [phone]  I spent 45 min with this patient of which greater than 50% was spent in counseling and coordination of care with the patient.

## 2019-08-26 DIAGNOSIS — F3181 Bipolar II disorder: Secondary | ICD-10-CM | POA: Diagnosis not present

## 2019-08-31 ENCOUNTER — Ambulatory Visit: Payer: Medicaid Other | Admitting: Gastroenterology

## 2019-08-31 DIAGNOSIS — F3181 Bipolar II disorder: Secondary | ICD-10-CM | POA: Diagnosis not present

## 2019-09-09 DIAGNOSIS — F3181 Bipolar II disorder: Secondary | ICD-10-CM | POA: Diagnosis not present

## 2019-09-09 NOTE — Progress Notes (Signed)
Virtual Visit via Video Note  I connected with Alison [name] on 09/10/19 at  9:00 AM EST by a video enabled telemedicine application and verified that I am speaking with the correct person using two identifiers.   I discussed the limitations of evaluation and management by telemedicine and the availability of in person appointments. The patient expressed understanding and agreed to proceed.    I discussed the assessment and treatment plan with the patient. The patient was provided an opportunity to ask questions and all were answered. The patient agreed with the plan and demonstrated an understanding of the instructions.   The patient was advised to call back or seek an in-person evaluation if the symptoms worsen or if the condition fails to improve as anticipated.   [place] [facility] MD OP Progress Note  09/10/2019 9:28 AM Alison [name]  MRN:  [id number]  Chief Complaint:  Chief Complaint    Follow-up     HPI: Alison [name] is a 43 year old female, Caucasian, lives in [place], originally from [place] was evaluated by telemedicine today.  She has a history of bipolar disorder, PTSD, history of borderline personality disorder, chronic pain from fibromyalgia, trigeminal neuralgia, history of liver transplant, B-cell lymphoma, SLE, diabetes melitis, hypertension, OSA, migraine headaches.  Patient today reports she continues to struggle with depressive symptoms.  She also may have had an episode of mania or hypomania recently when she did not sleep all night and spent time on her computer browsing for her father's birth certificate and researching genealogy sites.  She reports she found out that her dad was looking for her all his life and she would also like to make contact with him if possible.    Patient also reports sleep as restless.  She reports due to her Sjogren's disease she drinks a lot of water and hence has frequent urination at night which is chronic for her.  She reports she tried  hydroxyzine which does not help much.  She is interested in another sleep medication.  She did try a low dosage of melatonin in the past which did not help.  Patient denies any suicidality, homicidality or perceptual disturbances.  Patient reports that she has started psychotherapy sessions with [facility] [place] and that is going well.  Patient reports she is compliant on her Seroquel.  She however reports she has muscular cramps on and off.  She takes magnesium which helps to some extent.  She had a good Thanksgiving holiday with her roommates.   Visit Diagnosis:    ICD-10-CM   1. Bipolar I disorder, most recent episode depressed (HCC)  F31.30 DULoxetine (CYMBALTA) 20 MG capsule  2. 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  Past Medical History:  Past Medical History:  Diagnosis Date  . Abnormal uterine bleeding   . Allergy   . Anxiety   . Arthritis   . Bipolar disorder (manic depression) ([place])   . Chronic kidney failure   . Chronic renal disease, stage III   . Diabetes mellitus without complication ([place])   . 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[place])   . FH: trigeminal neuralgia   . GERD (gastroesophageal reflux disease)   . Heart murmur   . Hypertension   . Kidney mass   . Lupus ([place])   .  Lupus ([place])   . lymphoma   . Lymphoma ([place])   . Lymphoma ([place])   . Major depressive disorder   . Migraine   . Morbid obesity ([place])   . Neuromuscular disorder (HCC)    neuropathy  . Neuropathy   . Personality disorder ([place])   . Post herpetic neuralgia   . PTSD (post-traumatic stress disorder)   . Renal disorder   . S/P liver transplant [facility])     Past Surgical History:  Procedure Laterality Date  . BONE  MARROW BIOPSY  01/13/2015  . BREAST BIOPSY  12/2014  . BREAST BIOPSY  2011  . BREAST SURGERY    . CHOLECYSTECTOMY    . HERNIA REPAIR    . infusaport    . LIVER TRANSPLANT  12/17/1991  . LUMBAR PUNCTURE    . PORT A CATH INJECTION ([place] [facility])    . tumor removal  2015    Family Psychiatric History: Reviewed family psychiatric history from my progress note on 06/10/2019. Family History:  Family History  Problem Relation Age of Onset  . Heart disease Mother   . Hypertension Mother   . Cancer - Other Mother   . Bipolar disorder Mother   . Heart disease Father   . Hypertension Father   . Diabetes Father   . Parkinson's disease Maternal Grandmother   . Cancer Maternal Aunt   . Cancer Maternal Uncle   . Cancer Maternal Grandfather   . Lupus Paternal Grandmother   . Hypertension Brother     Social History: Reviewed social history from my progress note on 06/10/2019. Social History   Socioeconomic History  . Marital status: Single    Spouse name: Not on file  . Number of children: 0  . Years of education: 9  . Highest education level: GED or equivalent  Occupational History  . Occupation: disabled  Social Needs  . Financial resource strain: Somewhat hard  . Food insecurity    Worry: Sometimes true    Inability: Sometimes true  . Transportation needs    Medical: Yes    Non-medical: Yes  Tobacco Use  . Smoking status: Never Smoker  . Smokeless tobacco: Never Used  Substance and Sexual Activity  . Alcohol use: Not Currently    Frequency: Never  . Drug use: Not Currently    Types: Marijuana    Comment: pain managment last used in early april  . Sexual activity: Not Currently  Lifestyle  . Physical activity    Days per week: 1 day    Minutes per session: 30 min  . Stress: Rather much  Relationships  . Social connections    Talks on phone: Once a week    Gets together: Three times a week    Attends religious service: More than 4 times per year    Active member of club  or organization: No    Attends meetings of clubs or organizations: Never    Relationship status: Never married  Other Topics Concern  . Not on file  Social History Narrative  . Not on file    Allergies:  Allergies  Allergen Reactions  . Penicillin G Itching, Rash, Anaphylaxis and Palpitations  . Carbamazepine Other (See Comments)    Medication interaction-prograf  . Hydrocodone-Acetaminophen Itching  . Naproxen Itching    Metabolic Disorder Labs: Lab Results  Component Value Date   HGBA1C 6.6 (H) 07/15/2019   Lab Results  Component Value Date   PROLACTIN 13.4 07/15/2019   PROLACTIN 21.3  05/24/2019   No results found for: CHOL, TRIG, HDL, CHOLHDL, VLDL, LDLCALC Lab Results  Component Value Date   TSH 2.650 07/15/2019   TSH 4.650 (H) 05/24/2019    Therapeutic Level Labs: No results found for: LITHIUM No results found for: VALPROATE No components found for:  CBMZ  Current Medications: Current Outpatient Medications  Medication Sig Dispense Refill  . baclofen (LIORESAL) 20 MG tablet Take 1 tablet (20 mg total) by mouth 3 (three) times daily. 30 each 2  . Cranberry 500 MG TABS Take by mouth.    . hydrOXYzine (VISTARIL) 25 MG capsule Take 1 capsule (25 mg total) by mouth 2 (two) times daily as needed. For sever anxiety attacks 60 capsule 1  . lisinopril (ZESTRIL) 20 MG tablet Take 1 tablet (20 mg total) by mouth 1 day or 1 dose. 90 tablet 1  . LORazepam (ATIVAN) 1 MG tablet Take 1 tablet by mouth at bedtime as needed.    . Magnesium Bisglycinate (MAG GLYCINATE) 100 MG TABS Take by mouth.    . metFORMIN (GLUCOPHAGE) 1000 MG tablet Take 1 tablet by mouth 2 (two) times a day.    . pregabalin (LYRICA) 200 MG capsule Take 1 capsule (200 mg total) by mouth 3 (three) times daily. 90 capsule 2  . promethazine (PHENERGAN) 25 MG tablet Take 1 tablet by mouth as needed.    [name] QUEtiapine Fumarate (SEROQUEL XR) 150 MG 24 hr tablet Take 1 tablet (150 mg total) by mouth at bedtime. 90  tablet 0  . Specialty Vitamins Products (MAGNESIUM, AMINO ACID CHELATE,) 133 MG tablet Take 1 tablet by mouth 2 (two) times daily.    . SUMAtriptan (IMITREX) 100 MG tablet TAKE 1 TABLET BY MOUTH ONCE AS NEEDED FOR MIGRAINE. MAY TAKE A SECOND DOSE AFTER 2 HOURS IF NEEDED    . tacrolimus (PROGRAF) 1 MG capsule Take 1 capsule by mouth once daily 30 capsule 0  . tiZANidine (ZANAFLEX) 4 MG tablet Take 1 tablet (4 mg total) by mouth 2 (two) times daily as needed for muscle spasms. 30 tablet 2  . benztropine (COGENTIN) 0.5 MG tablet Take 1 tablet (0.5 mg total) by mouth daily as needed for tremors. 30 tablet 1  . DULoxetine (CYMBALTA) 20 MG capsule Take 1 capsule (20 mg total) by mouth daily. 30 capsule 1  . Melatonin 5 MG CAPS Take 1-2 capsules (5-10 mg total) by mouth at bedtime. 100 capsule 0  . mycophenolate (CELLCEPT) 250 MG capsule Take 1 capsule by mouth 2 (two) times a day.     No current facility-administered medications for this visit.      Musculoskeletal: Strength & Muscle Tone: UTA Gait & Station: normal Patient leans: N/A  Psychiatric Specialty Exam: Review of Systems  Genitourinary: Positive for frequency (chronic).  Musculoskeletal:       Muscle cramps of legs on and off   Psychiatric/Behavioral: Positive for depression. The patient is nervous/anxious and has insomnia.   All other systems reviewed and are negative.   Last menstrual period 02/04/2018.There is no height or weight on file to calculate BMI.  General Appearance: Casual  Eye Contact:  Fair  Speech:  Clear and Coherent  Volume:  Normal  Mood:  Anxious and Depressed  Affect:  Congruent  Thought Process:  Goal Directed and Descriptions of Associations: Intact  Orientation:  Full (Time, Place, and Person)  Thought Content: Logical   Suicidal Thoughts:  No  Homicidal Thoughts:  No  Memory:  Immediate;   Fair  Recent;   Fair Remote;   Fair  Judgement:  Intact  Insight:  Good  Psychomotor Activity:  Normal   Concentration:  Concentration: Fair and Attention Span: Fair  Recall:  [employer] of Knowledge: Fair  Language: Fair  Akathisia:  No  Handed:  Right  AIMS (if indicated): Reports muscle cramps on and off.  Assets:  Communication Skills Desire for Improvement Social Support  ADL's:  Intact  Cognition: WNL  Sleep:  Poor   Screenings: PHQ2-9     Chronic Care Management from 08/10/2019 in [facility] Office Visit from 07/30/2019 in [facility] Office Visit from 06/17/2019 in [facility] Office Visit from 05/20/2019 in [facility] Office Visit from 04/05/2019 in [facility]  PHQ-2 Total Score  4  0  4  2  6   PHQ-9 Total Score  5  0  16  6  18        Assessment and Plan: Alison [name] is a 43 year old Caucasian female, engaged, on disability, has a history of bipolar disorder, PTSD, chronic pain from fibromyalgia, trigeminal neuralgia, status post liver transplant, B-cell lymphoma, OSA, diabetes, hypertension was evaluated by telemedicine today.  Patient is biologically predisposed given her history of trauma, family history as well as multiple health issues.  Patient with psychosocial stressors of being separated from her father, her own pain and other health problems.  Patient continues to struggle with her mood as well as sleep and will benefit from medication readjustment.  She will also benefit from continued psychotherapy sessions.  Plan as noted below.  Plan Bipolar disorder-unstable Seroquel XR 150 mg p.o. nightly Patient does report occasional muscular cramps-hence will start Cogentin as needed Start Cymbalta 20 mg p.o. daily  PTSD-some progress Start Cymbalta 20 mg p.o. daily Hydroxyzine 25 mg p.o. twice daily as needed for anxiety attacks Start melatonin 5 to 10 mg p.o. nightly.  Discussed working on her sleep hygiene.  She also has frequent urination which does affect her sleep. Patient  reports she is currently in psychotherapy sessions with [facility] at [facility] at [phone].  Neuroleptic induced Parkinson's disease-unstable Patient reports muscle cramps-start Cogentin 0.5 mg p.o. daily as needed.  Advised patient to limit use.  Follow-up in clinic in 1 month or sooner if needed.  January 8 at 9 AM  I have spent atleast 15 minutes non face to face with patient today. More than 50 % of the time was spent for psychoeducation and supportive psychotherapy and care coordination. This note was generated in part or whole with voice recognition software. Voice recognition is usually quite accurate but there are transcription errors that can and very often do occur. I apologize for any typographical errors that were not detected and corrected.      [name], MD 09/10/2019, 9:28 AM

## 2019-09-10 ENCOUNTER — Other Ambulatory Visit: Payer: Self-pay

## 2019-09-10 ENCOUNTER — Encounter: Payer: Self-pay | Admitting: Psychiatry

## 2019-09-10 ENCOUNTER — Ambulatory Visit (INDEPENDENT_AMBULATORY_CARE_PROVIDER_SITE_OTHER): Payer: Medicaid Other | Admitting: Psychiatry

## 2019-09-10 DIAGNOSIS — G2111 Neuroleptic induced parkinsonism: Secondary | ICD-10-CM

## 2019-09-10 DIAGNOSIS — F4310 Post-traumatic stress disorder, unspecified: Secondary | ICD-10-CM | POA: Diagnosis not present

## 2019-09-10 DIAGNOSIS — F3130 Bipolar disorder, current episode depressed, mild or moderate severity, unspecified: Secondary | ICD-10-CM | POA: Diagnosis not present

## 2019-09-10 MED ORDER — BENZTROPINE MESYLATE 0.5 MG PO TABS: 0.5000 mg | ORAL_TABLET | Freq: Every day | ORAL | 1 refills | Status: DC | PRN

## 2019-09-10 MED ORDER — MELATONIN 5 MG PO CAPS: 5.0000 mg | ORAL_CAPSULE | Freq: Every day | ORAL | 0 refills | Status: DC

## 2019-09-10 MED ORDER — DULOXETINE HCL 20 MG PO CPEP: 20.0000 mg | ORAL_CAPSULE | Freq: Every day | ORAL | 1 refills | Status: DC

## 2019-09-13 DIAGNOSIS — G500 Trigeminal neuralgia: Secondary | ICD-10-CM | POA: Diagnosis not present

## 2019-09-13 DIAGNOSIS — G5622 Lesion of ulnar nerve, left upper limb: Secondary | ICD-10-CM | POA: Diagnosis not present

## 2019-09-13 DIAGNOSIS — M797 Fibromyalgia: Secondary | ICD-10-CM | POA: Diagnosis not present

## 2019-09-16 ENCOUNTER — Other Ambulatory Visit: Payer: Self-pay

## 2019-09-16 ENCOUNTER — Ambulatory Visit
Admission: EM | Admit: 2019-09-16 | Discharge: 2019-09-16 | Disposition: A | Discharge status: Home / Self Care | Payer: Medicaid Other | Attending: Family Medicine | Admitting: Family Medicine

## 2019-09-16 ENCOUNTER — Telehealth: Payer: Self-pay | Admitting: Family Medicine

## 2019-09-16 ENCOUNTER — Ambulatory Visit
Admit: 2019-09-16 | Discharge: 2019-09-16 | Disposition: A | Discharge status: Home / Self Care | Payer: Medicaid Other | Attending: Family Medicine | Admitting: Family Medicine

## 2019-09-16 ENCOUNTER — Other Ambulatory Visit
Admission: RE | Admit: 2019-09-16 | Discharge: 2019-09-16 | Disposition: A | Discharge status: Home / Self Care | Payer: Medicaid Other | Source: Home / Self Care | Attending: Family Medicine | Admitting: Family Medicine

## 2019-09-16 ENCOUNTER — Other Ambulatory Visit: Payer: Self-pay | Admitting: Family Medicine

## 2019-09-16 ENCOUNTER — Ambulatory Visit (INDEPENDENT_AMBULATORY_CARE_PROVIDER_SITE_OTHER): Payer: Medicaid Other | Admitting: Family Medicine

## 2019-09-16 ENCOUNTER — Encounter: Payer: Self-pay | Admitting: Family Medicine

## 2019-09-16 VITALS — BP 124/86 | HR 88 | Temp 97.8°F | Resp 18 | Ht 67.0 in | Wt 278.4 lb

## 2019-09-16 DIAGNOSIS — E348 Other specified endocrine disorders: Secondary | ICD-10-CM

## 2019-09-16 DIAGNOSIS — N950 Postmenopausal bleeding: Secondary | ICD-10-CM

## 2019-09-16 DIAGNOSIS — F3130 Bipolar disorder, current episode depressed, mild or moderate severity, unspecified: Secondary | ICD-10-CM

## 2019-09-16 DIAGNOSIS — R600 Localized edema: Secondary | ICD-10-CM | POA: Insufficient documentation

## 2019-09-16 DIAGNOSIS — Z1389 Encounter for screening for other disorder: Secondary | ICD-10-CM

## 2019-09-16 DIAGNOSIS — F3181 Bipolar II disorder: Secondary | ICD-10-CM | POA: Diagnosis not present

## 2019-09-16 DIAGNOSIS — N3281 Overactive bladder: Secondary | ICD-10-CM | POA: Diagnosis not present

## 2019-09-16 DIAGNOSIS — G500 Trigeminal neuralgia: Secondary | ICD-10-CM | POA: Diagnosis not present

## 2019-09-16 DIAGNOSIS — C884 Extranodal marginal zone B-cell lymphoma of mucosa-associated lymphoid tissue [MALT-lymphoma]: Secondary | ICD-10-CM | POA: Diagnosis not present

## 2019-09-16 DIAGNOSIS — M47816 Spondylosis without myelopathy or radiculopathy, lumbar region: Secondary | ICD-10-CM

## 2019-09-16 DIAGNOSIS — M797 Fibromyalgia: Secondary | ICD-10-CM

## 2019-09-16 DIAGNOSIS — F4310 Post-traumatic stress disorder, unspecified: Secondary | ICD-10-CM | POA: Diagnosis not present

## 2019-09-16 DIAGNOSIS — M5116 Intervertebral disc disorders with radiculopathy, lumbar region: Secondary | ICD-10-CM

## 2019-09-16 DIAGNOSIS — G4733 Obstructive sleep apnea (adult) (pediatric): Secondary | ICD-10-CM

## 2019-09-16 DIAGNOSIS — I10 Essential (primary) hypertension: Secondary | ICD-10-CM

## 2019-09-16 DIAGNOSIS — Z944 Liver transplant status: Secondary | ICD-10-CM

## 2019-09-16 DIAGNOSIS — E669 Obesity, unspecified: Secondary | ICD-10-CM

## 2019-09-16 DIAGNOSIS — G629 Polyneuropathy, unspecified: Secondary | ICD-10-CM | POA: Diagnosis not present

## 2019-09-16 DIAGNOSIS — N2581 Secondary hyperparathyroidism of renal origin: Secondary | ICD-10-CM

## 2019-09-16 LAB — CBC WITH DIFFERENTIAL/PLATELET
Abs Immature Granulocytes: 0.05 10*3/uL (ref 0.00–0.07)
Basophils Absolute: 0.1 10*3/uL (ref 0.0–0.1)
Basophils Relative: 1 %
Eosinophils Absolute: 0.5 10*3/uL (ref 0.0–0.5)
Eosinophils Relative: 6 %
HCT: 38.3 % (ref 36.0–46.0)
Hemoglobin: 13 g/dL (ref 12.0–15.0)
Immature Granulocytes: 1 %
Lymphocytes Relative: 19 %
Lymphs Abs: 1.4 10*3/uL (ref 0.7–4.0)
MCH: 29.1 pg (ref 26.0–34.0)
MCHC: 33.9 g/dL (ref 30.0–36.0)
MCV: 85.9 fL (ref 80.0–100.0)
Monocytes Absolute: 0.6 10*3/uL (ref 0.1–1.0)
Monocytes Relative: 8 %
Neutro Abs: 4.9 10*3/uL (ref 1.7–7.7)
Neutrophils Relative %: 65 %
Platelets: 138 10*3/uL — ABNORMAL LOW (ref 150–400)
RBC: 4.46 MIL/uL (ref 3.87–5.11)
RDW: 13.2 % (ref 11.5–15.5)
WBC: 7.5 10*3/uL (ref 4.0–10.5)
nRBC: 0 % (ref 0.0–0.2)

## 2019-09-16 LAB — COMPREHENSIVE METABOLIC PANEL
ALT: 28 U/L (ref 0–44)
AST: 28 U/L (ref 15–41)
Albumin: 3.9 g/dL (ref 3.5–5.0)
Alkaline Phosphatase: 66 U/L (ref 38–126)
Anion gap: 11 (ref 5–15)
BUN: 19 mg/dL (ref 6–20)
CO2: 24 mmol/L (ref 22–32)
Calcium: 9.2 mg/dL (ref 8.9–10.3)
Chloride: 103 mmol/L (ref 98–111)
Creatinine, Ser: 1.02 mg/dL — ABNORMAL HIGH (ref 0.44–1.00)
GFR calc Af Amer: >60 mL/min (ref >60)
GFR calc non Af Amer: >60 mL/min (ref >60)
Glucose, Bld: 121 mg/dL — ABNORMAL HIGH (ref 70–99)
Potassium: 4.2 mmol/L (ref 3.5–5.1)
Sodium: 138 mmol/L (ref 135–145)
Total Bilirubin: 0.5 mg/dL (ref 0.3–1.2)
Total Protein: 7.6 g/dL (ref 6.5–8.1)

## 2019-09-16 LAB — PROTIME-INR
INR: 0.9 (ref 0.8–1.2)
Prothrombin Time: 12.4 seconds (ref 11.4–15.2)

## 2019-09-16 LAB — APTT: aPTT: 34 seconds (ref 24–36)

## 2019-09-16 MED ORDER — BACLOFEN 20 MG PO TABS: 20.0000 mg | ORAL_TABLET | Freq: Three times a day (TID) | ORAL | 2 refills | Status: DC | PRN

## 2019-09-16 NOTE — Telephone Encounter (Signed)
Pt had dermatology referral placed, has not heard back.  Please check on referral and call patient to update her. Thanks!

## 2019-09-16 NOTE — Patient Instructions (Addendum)
   Schedule follow up with Dr. Georgianne Fick to monitor your abnormal vaginal bleeding.

## 2019-09-16 NOTE — Addendum Note (Signed)
Addended by: [name] on: 09/16/2019 02:04 PM   Modules accepted: Orders

## 2019-09-16 NOTE — Progress Notes (Addendum)
Name: Alison [name]   MRN: [id number]    DOB: 1976-04-09   Date:09/16/2019       Progress Note  Subjective  Chief Complaint  No chief complaint on file.   HPI  Abnormal Nevi: She has a nevus on the central abdomen that a dermatologist was following prior to her move.  She is a transplant patient and states was seeing dermatology regularly. Referral was placed 3 months ago and she has not heard back.  We will check in on this today. No changes to the lesion of concern.  Trigeminal Neuralgia and Chronic Pain: She switched from Dr. Holley Raring to Dr. Humphrey Rolls since last visit and is happy with this change.  Now on Oxycodone 75m ER BID and her pain is much better controlled.She has also seen neurology (Dr. SManuella Ghazi for trigeminal neuralgia pain. She is looking forward to this treatment. She is taking cymbalta.She is still taking Lyrica 2058mTID and has baclofen that she takes BID, with 1 additional dose PRN.    FMS/SLE/DDD: She was diagnosed with FMS for about 10 years, SLE for about 17 years. She states she has likely had SLE since age [age]Taking lyrica and doing okay on this, off of all opiate therapies.  She is seeing Dr. KaChancy Milroyow for pain management as above.  Bipolar I/PTSD:  Seeing Dr. EaShea Evansoff of ativan - taking hydroxyzine PRN, she is also taking seroquel at night; also on cymbalta.  She feels like her mood has been improving with counseling and medication; but her sleep is still not quite stable (is improving).    OSA: She has OSA, but does not wear her CPAP because of her trigeminal neuralgia.  Overactive Bladder: Put on myrbetriq from Dr. BrErlene Quan11/18/2020) and overall seeing an improvement.  Having less incontinence with the medication.  Abnormal Vaginal Bleeding: She had gone over 12 months without a menses, and has started having vaginal bleeding 3 months ago, this has since stopped and she has not had any issue since then.  She did see Dr. StGeorgianne Ficknd was told her lining  was WNL, she is following up in 3 months. No family history ovarian or uterine cancers. Of note, the patient was recently diagnosed with recurrent and diffuse large B-cell lymphomaby Dr. 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 She was ill at age [age]nd had a liver transplant at age [age]She has blood work every 3 months, but has never had any rejection issues. . She states that her chronic pain is related to chemo-related damage - seeing pain management for this. Seeing Dr. WoAllen Norrisvery 6 months now for routine follow up.  Marginal B-cell Lymphoma (Stage II Extranodal): Seeing Dr. [place]ese Banks [place].  She finished rounds of Rituxan in October 2020.  She is doing well and has no concerns from a PCP perspective at this time.  Umbilical Hernia: She notes multiple repairs in the past, she denies tenderness, redness, change in color or drainage, no significant size changes this time. Does not want to see anyone right now for this.  HTN: Taking lisinopril 2073maily; usually runs 120's/70's at home. No chest pain, shortness of breath, or headaches. Stable and at goal today.  Does have LLE edema - see below.  Diabetes mellitustype 2: Last A1C in October by Endocrinology and at 6.4%.  Seeing [place] NP-C with endocrinology.  Does have some increased  urinary frequency - see above; has Sjogren's and has increased thirst likely due to this and with myrbetriq.  Denies polyphagia.  Taking metformin.    Pineal Gland Cyst & Secondary Hyperparathyroidism: Was diagnosed when living in [place]; was told to simply monitor but not additional testing or evaluation. Followed by Endocrinology - Pasty Arch NP-C  Obesity: She is limiting portions, eating balanced diet; walking more often now that it's cold and she can't swim.   LLE edema: Noticed over the last 3 days.  Denies  redness, heat, or tenderness, no pain to the leg. She has significant swelling to the LLE that is slightly pitting. No chest pain, shortness of breath.    Patient Active Problem List   Diagnosis Date Noted  . Carpal tunnel syndrome, left 07/26/2019  . Cubital tunnel syndrome on left 07/26/2019  . Bipolar I disorder, most recent episode depressed ([place]) 07/12/2019  . PTSD (post-traumatic stress disorder) 07/12/2019  . Umbilical hernia without obstruction and without gangrene 06/17/2019  . Encounter for surveillance of abnormal nevi 06/17/2019  . Pineal gland cyst 06/17/2019  . Chronic hip pain, left 06/02/2019  . Lumbar spondylosis 06/02/2019  . Mouth dryness 06/02/2019  . Obesity (BMI 35.0-39.9 without comorbidity) 06/02/2019  . Goals of care, counseling/discussion 05/18/2019  . 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[place]) 05/18/2019  . Occipital neuralgia 05/13/2019  . Plantar fasciitis of left foot 04/27/2019  . Fibromyalgia 03/23/2019  . Systemic lupus erythematosus (SLE) in adult ([place]) 03/23/2019  . Essential hypertension 03/23/2019  . Hepatitis 03/23/2019  . Mass of left kidney 03/23/2019  . Diabetes mellitus ([place]) 03/23/2019  . Nausea without vomiting 03/23/2019  . Neuropathy 03/23/2019  . Cancer ([place]) 03/23/2019  . Hyperlipidemia 03/23/2019  . Osteoarthritis 03/23/2019  . Trigeminal neuralgia 03/23/2019  . Chronic renal disease, stage III 03/23/2019  . Nephrolithiasis 03/23/2019  . Migraine 03/23/2019  . OSA on CPAP 03/23/2019  . Fibrocystic breast 03/23/2019  . Heart murmur 03/23/2019  . Bipolar disorder (manic depression) ([place]) 07/02/2017  . Abnormal uterine bleeding 03/18/2017  . Major depressive disorder, recurrent ([place]) 03/18/2017  . Morbid obesity ([place]) 03/18/2017  . Secondary hyperparathyroidism ([place]) 02/07/2017  . Neuropathic pain 11/19/2016  . Post herpetic  neuralgia 11/19/2016  . 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[place]) 01/13/2015  . Lumbar disc disease with radiculopathy 07/26/2013  . S/P liver transplant ([place]) 07/26/2013  . Type 2 diabetes mellitus, uncontrolled ([place]) 07/26/2013  . Facial nerve disorder 06/29/2012  . Low back pain 12/26/2010    Past Surgical History:  Procedure Laterality Date  . BONE MARROW BIOPSY  01/13/2015  . BREAST BIOPSY  12/2014  . BREAST BIOPSY  2011  . BREAST SURGERY    . CHOLECYSTECTOMY    . HERNIA REPAIR    . infusaport    . LIVER TRANSPLANT  12/17/1991  . LUMBAR PUNCTURE    . PORT A CATH INJECTION ([place] [facility])    . tumor removal  2015    Family History  Problem Relation Age of Onset  . Heart disease Mother   . Hypertension Mother   . Cancer - Other Mother   . Bipolar disorder Mother   . Heart disease Father   . Hypertension Father   . Diabetes Father   . Parkinson's disease Maternal Grandmother   . Cancer Maternal Aunt   . Cancer Maternal Uncle   . Cancer Maternal Grandfather   . Lupus Paternal Grandmother   .  Hypertension Brother     Social History   Socioeconomic History  . Marital status: Single    Spouse name: Not on file  . Number of children: 0  . Years of education: 9  . Highest education level: GED or equivalent  Occupational History  . Occupation: disabled  Tobacco Use  . Smoking status: Never Smoker  . Smokeless tobacco: Never Used  Substance and Sexual Activity  . Alcohol use: Not Currently  . Drug use: Not Currently    Types: Marijuana    Comment: pain managment last used in early april  . Sexual activity: Not Currently  Other Topics Concern  . Not on file  Social History Narrative  . Not on file   Social Determinants of Health   Financial Resource Strain: Medium Risk  . Difficulty of Paying Living Expenses: Somewhat hard  Food Insecurity: Food Insecurity Present  . Worried About Charity fundraiser in the Last Year: Sometimes true  . Ran Out of Food in  the Last Year: Sometimes true  Transportation Needs: Unmet Transportation Needs  . Lack of Transportation (Medical): Yes  . Lack of Transportation (Non-Medical): Yes  Physical Activity: Insufficiently Active  . Days of Exercise per Week: 1 day  . Minutes of Exercise per Session: 30 min  Stress: Stress Concern Present  . Feeling of Stress : Rather much  Social Connections: Somewhat Isolated  . Frequency of Communication with Friends and Family: Once a week  . Frequency of Social Gatherings with Friends and Family: Three times a week  . Attends Religious Services: More than 4 times per year  . Active Member of Clubs or Organizations: No  . Attends Archivist Meetings: Never  . Marital Status: Never married  Intimate Partner Violence: Not At Risk  . Fear of Current or Ex-Partner: No  . Emotionally Abused: No  . Physically Abused: No  . Sexually Abused: No     Current Outpatient Medications:  .  baclofen (LIORESAL) 20 MG tablet, Take 1 tablet (20 mg total) by mouth 3 (three) times daily., Disp: 30 each, Rfl: 2 .  benztropine (COGENTIN) 0.5 MG tablet, Take 1 tablet (0.5 mg total) by mouth daily as needed for tremors., Disp: 30 tablet, Rfl: 1 .  Cranberry 500 MG TABS, Take by mouth., Disp: , Rfl:  .  DULoxetine (CYMBALTA) 20 MG capsule, Take 1 capsule (20 mg total) by mouth daily., Disp: 30 capsule, Rfl: 1 .  DULoxetine (CYMBALTA) 20 MG capsule, Take by mouth., Disp: , Rfl:  .  hydrOXYzine (VISTARIL) 25 MG capsule, Take 1 capsule (25 mg total) by mouth 2 (two) times daily as needed. For sever anxiety attacks, Disp: 60 capsule, Rfl: 1 .  lisinopril (ZESTRIL) 20 MG tablet, Take 1 tablet (20 mg total) by mouth 1 day or 1 dose., Disp: 90 tablet, Rfl: 1 .  LORazepam (ATIVAN) 1 MG tablet, Take 1 tablet by mouth at bedtime as needed., Disp: , Rfl:  .  Magnesium Bisglycinate (MAG GLYCINATE) 100 MG TABS, Take by mouth., Disp: , Rfl:  .  Melatonin 5 MG CAPS, Take 1-2 capsules (5-10 mg  total) by mouth at bedtime., Disp: 100 capsule, Rfl: 0 .  metFORMIN (GLUCOPHAGE) 1000 MG tablet, Take 1 tablet by mouth 2 (two) times a day., Disp: , Rfl:  .  mirabegron ER (MYRBETRIQ) 50 MG TB24 tablet, Take 50 mg by mouth daily., Disp: , Rfl:  .  mycophenolate (CELLCEPT) 250 MG capsule, Take 1 capsule by mouth  2 (two) times a day., Disp: , Rfl:  .  oxyCODONE (OXYCONTIN) 10 mg 12 hr tablet, Take by mouth., Disp: , Rfl:  .  pregabalin (LYRICA) 200 MG capsule, Take 1 capsule (200 mg total) by mouth 3 (three) times daily., Disp: 90 capsule, Rfl: 2 .  promethazine (PHENERGAN) 25 MG tablet, Take 1 tablet by mouth as needed., Disp: , Rfl:  .  QUEtiapine Fumarate (SEROQUEL XR) 150 MG 24 hr tablet, Take 1 tablet (150 mg total) by mouth at bedtime., Disp: 90 tablet, Rfl: 0 .  Specialty Vitamins Products (MAGNESIUM, AMINO ACID CHELATE,) 133 MG tablet, Take 1 tablet by mouth 2 (two) times daily., Disp: , Rfl:  .  SUMAtriptan (IMITREX) 100 MG tablet, TAKE 1 TABLET BY MOUTH ONCE AS NEEDED FOR MIGRAINE. MAY TAKE A SECOND DOSE AFTER 2 HOURS IF NEEDED, Disp: , Rfl:  .  tacrolimus (PROGRAF) 1 MG capsule, Take 1 capsule by mouth once daily, Disp: 30 capsule, Rfl: 0 .  tiZANidine (ZANAFLEX) 4 MG tablet, Take 1 tablet (4 mg total) by mouth 2 (two) times daily as needed for muscle spasms., Disp: 30 tablet, Rfl: 2  Allergies  Allergen Reactions  . Penicillin G Itching, Rash, Anaphylaxis and Palpitations  . Carbamazepine Other (See Comments)    Medication interaction-prograf  . Hydrocodone-Acetaminophen Itching  . Naproxen Itching    I personally reviewed active problem list, medication list, allergies, notes from last encounter, lab results with the patient/caregiver today.   ROS  Ten systems reviewed and is negative except as mentioned in HPI  Objective  Vitals:   09/16/19 1052  BP: 124/86  Pulse: 88  Resp: 18  Temp: 97.8 F (36.6 C)  TempSrc: Temporal  SpO2: 99%  Weight: 278 lb 6.4 oz (126.3 kg)   Height: 5' 7"  (1.702 m)   Body mass index is 43.6 kg/m.  Physical Exam  Constitutional: Patient appears well-developed and well-nourished. No distress.  HENT: Head: Normocephalic and atraumatic.  Eyes: Conjunctivae and EOM are normal. No scleral icterus. Neck: Normal range of motion. Neck supple. No JVD present. No thyromegaly present.  Cardiovascular: Normal rate, regular rhythm and normal heart sounds.  No murmur heard. No BLE edema. Pulmonary/Chest: Effort normal and breath sounds normal. No respiratory distress. Musculoskeletal: Normal range of motion.  She has edema to the LLE that is +1 pitting, the LEFT calf is tight with some mild tenderness, no erythema.  The RLE has no edema.  Neurological: Pt is alert and oriented to person, place, and time. No cranial nerve deficit. Coordination, balance, strength, speech and gait are normal.  Skin: Skin is warm and dry. No rash noted. No erythema.  Psychiatric: Patient has a normal mood and affect. behavior is normal. Judgment and thought content normal.  No results found for this or any previous visit (from the past 72 hour(s)).  PHQ2/9: Depression screen [facility] 2/9 09/16/2019 08/10/2019 07/30/2019 06/17/2019 05/20/2019  Decreased Interest 1 1 0 2 1  Down, Depressed, Hopeless 1 3 0 2 1  PHQ - 2 Score 2 4 0 4 2  Altered sleeping 0 0 0 2 1  Tired, decreased energy 1 0 0 2 1  Change in appetite 1 1 0 2 1  Feeling bad or failure about yourself  0 0 0 2 0  Trouble concentrating 1 0 0 2 1  Moving slowly or fidgety/restless 0 0 0 2 0  Suicidal thoughts 0 - 0 0 0  PHQ-9 Score 5 5 0 16 6  Difficult doing work/chores Not difficult at all - Not difficult at all Somewhat difficult Somewhat difficult   PHQ-2/9 Result is positive.    Fall Risk: Fall Risk  09/16/2019 07/30/2019 06/18/2019 06/17/2019 05/20/2019  Falls in the past year? 0 0 1 1 1   Number falls in past yr: 0 0 0 1 1  Injury with Fall? 0 0 0 0 0  Risk for fall due to : - - - - History  of fall(s)  Follow up Falls evaluation completed Falls evaluation completed - Falls evaluation completed Falls evaluation completed    Assessment & Plan  1. Neuropathy - Seeing pain management - baclofen (LIORESAL) 20 MG tablet; Take 1 tablet (20 mg total) by mouth 3 (three) times daily as needed for muscle spasms.  Dispense: 90 each; Refill: 2  2. Trigeminal neuralgia - Seeing neurology - baclofen (LIORESAL) 20 MG tablet; Take 1 tablet (20 mg total) by mouth 3 (three) times daily as needed for muscle spasms.  Dispense: 90 each; Refill: 2  3. Lumbar disc disease with radiculopathy - Seeing pain management - baclofen (LIORESAL) 20 MG tablet; Take 1 tablet (20 mg total) by mouth 3 (three) times daily as needed for muscle spasms.  Dispense: 90 each; Refill: 2  4. Lumbar spondylosis - Seeing pain management - baclofen (LIORESAL) 20 MG tablet; Take 1 tablet (20 mg total) by mouth 3 (three) times daily as needed for muscle spasms.  Dispense: 90 each; Refill: 2  5. Fibromyalgia - Seeing pain management - baclofen (LIORESAL) 20 MG tablet; Take 1 tablet (20 mg total) by mouth 3 (three) times daily as needed for muscle spasms.  Dispense: 90 each; Refill: 2  6. Encounter for surveillance of abnormal nevi - Note sent to referrals coordinator to check in on dermatology referral.  7. Bipolar I disorder, most recent episode depressed ([place]) 8. PTSD (post-traumatic stress disorder) - Seeing psychiatry and is doing well  9. Overactive bladder - Seeing urology, on Myrbetriq and improving, still having some nighttime incontinence but much fewer.  10. OSA (obstructive sleep apnea) - Not wearing CPAP due to trigeminal neuralgia pain.  11. Abnormal vaginal bleeding in postmenopausal patient - No recent bleeding, following with GYN Q76month  12. 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 RJanese Banks 13. S/P liver transplant ([facility]) - Seeing  Dr. WAllen Norris 14. Morbid obesity ([place] - Discussed importance of 150 minutes of physical activity weekly, eat two servings of fish weekly, eat one serving of tree nuts ( cashews, pistachios, pecans, almonds..[name] every other day, eat 6 servings of fruit/vegetables daily and drink plenty of water and avoid sweet beverages.   15. Secondary hyperparathyroidism ([place] - Seeing endocrinology  16. Obesity (BMI 35.0-39.9 without comorbidity) - See above regarding teaching  17. Pineal gland cyst - Following with endocrinology  18. Essential hypertension - Stable with ACE-I  19. Edema of lower extremity - I am concerned for DVT in LLE, I advised to do outpatient [place] labs, however she strongly prefers to go to UC.  She is high risk for DVT.  She will have her housemate drive her to UC directly from our office per her preference.  - No chest pain or shortness of breath, vitals are stable.  Addendum: Patient went to UC, they requested that I order labs and [place]  PT is agreeable - I have placed orders and will happily manage from outpatient perspective.

## 2019-09-17 ENCOUNTER — Encounter: Payer: Self-pay | Admitting: Family Medicine

## 2019-09-20 DIAGNOSIS — G894 Chronic pain syndrome: Secondary | ICD-10-CM | POA: Diagnosis not present

## 2019-09-20 DIAGNOSIS — F320 Major depressive disorder, single episode, mild: Secondary | ICD-10-CM | POA: Diagnosis not present

## 2019-09-20 DIAGNOSIS — G893 Neoplasm related pain (acute) (chronic): Secondary | ICD-10-CM | POA: Diagnosis not present

## 2019-09-21 ENCOUNTER — Encounter (INDEPENDENT_AMBULATORY_CARE_PROVIDER_SITE_OTHER): Payer: Self-pay | Admitting: Vascular Surgery

## 2019-09-21 ENCOUNTER — Other Ambulatory Visit: Payer: Self-pay

## 2019-09-21 ENCOUNTER — Ambulatory Visit (INDEPENDENT_AMBULATORY_CARE_PROVIDER_SITE_OTHER): Payer: Medicaid Other | Admitting: Vascular Surgery

## 2019-09-21 VITALS — BP 139/83 | HR 73 | Resp 16 | Wt 273.0 lb

## 2019-09-21 DIAGNOSIS — E1165 Type 2 diabetes mellitus with hyperglycemia: Secondary | ICD-10-CM | POA: Diagnosis not present

## 2019-09-21 DIAGNOSIS — R600 Localized edema: Secondary | ICD-10-CM

## 2019-09-21 DIAGNOSIS — I10 Essential (primary) hypertension: Secondary | ICD-10-CM

## 2019-09-21 DIAGNOSIS — C8330 Diffuse large B-cell lymphoma, unspecified site: Secondary | ICD-10-CM | POA: Diagnosis not present

## 2019-09-21 DIAGNOSIS — Z944 Liver transplant status: Secondary | ICD-10-CM

## 2019-09-21 NOTE — Assessment & Plan Note (Signed)
Lymphoma would certainly increase her likelihood of developing lymphedema which is likely present at this point.

## 2019-09-21 NOTE — Assessment & Plan Note (Signed)
blood glucose control important in reducing the progression of atherosclerotic disease. Also, involved in wound healing. On appropriate medications.  

## 2019-09-21 NOTE — Assessment & Plan Note (Signed)
I have had a long discussion with the patient regarding swelling and why it  causes symptoms.  Patient will begin wearing graduated compression stockings class 1 (20-30 mmHg) on a daily basis a prescription was given. The patient will  beginning wearing the stockings first thing in the morning and removing them in the evening. The patient is instructed specifically not to sleep in the stockings.   In addition, behavioral modification will be initiated.  This will include frequent elevation, use of over the counter pain medications and exercise such as walking.  I have reviewed systemic causes for chronic edema such as liver, kidney and cardiac etiologies.  The patient denies problems with these organ systems.    Consideration for a lymph pump will also be made based upon the effectiveness of conservative therapy.  This would help to improve the edema control and prevent sequela such as ulcers and infections   Patient should undergo duplex ultrasound of the venous system to ensure that DVT or reflux is not present.  The patient will follow-up with me after the ultrasound.

## 2019-09-21 NOTE — Assessment & Plan Note (Signed)
blood pressure control important in reducing the progression of atherosclerotic disease. On appropriate oral medications.  

## 2019-09-21 NOTE — Progress Notes (Signed)
Patient ID: Alison [name], female   DOB: 04-11-1976, 42 y.o.   MRN: [id number]  Chief Complaint  Patient presents with  . New Patient (Initial Visit)    ref Uvaldo Rising le edema    HPI Alison [name] is a 43 y.o. female.  I am asked to see the patient by E. Uvaldo Rising, NP for evaluation of leg swelling.  The patient reports she has been having medical issues for over 30 years since she was 43 years old, but this is really the first time that she has had leg swelling.  Over the past few months her left leg has become progressively more swollen.  It is heavy and tired but not overtly painful.  No previous history of DVT and she was checked for DVT without having this.  She has had multiple types of lymphoma and already has some lymphedema in her right breast and arm from a sentinel node biopsy.  There is no clear inciting event or causative factor that started the swelling few months ago.  This leg swelling has improved slightly with elevation and activity.  She has worn compression stockings in the past but not recently.  No open ulceration or infection.  No fever or chills.  No chest pain or shortness of breath.     Past Medical History:  Diagnosis Date  . Abnormal uterine bleeding   . Allergy   . Anxiety   . Arthritis   . Bipolar disorder (manic depression) ([place])   . Chronic kidney failure   . Chronic renal disease, stage III   . Diabetes mellitus without complication ([place])   . 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[place])   . FH: trigeminal neuralgia   . GERD (gastroesophageal reflux disease)   . Heart murmur   . Hypertension   . Kidney mass   . Lupus ([place])   . Lupus ([place])   . lymphoma   . Lymphoma ([place])   . Lymphoma ([place])   . Major depressive disorder   . Migraine   . Morbid obesity ([place])   . Neuromuscular disorder (HCC)    neuropathy  . Neuropathy   . Personality disorder ([place])   . Post herpetic neuralgia   . PTSD (post-traumatic stress disorder)   . Renal disorder   . S/P  liver transplant [facility])     Past Surgical History:  Procedure Laterality Date  . BONE MARROW BIOPSY  01/13/2015  . BREAST BIOPSY  12/2014  . BREAST BIOPSY  2011  . BREAST SURGERY    . CHOLECYSTECTOMY    . HERNIA REPAIR    . infusaport    . LIVER TRANSPLANT  12/17/1991  . LUMBAR PUNCTURE    . PORT A CATH INJECTION ([place] HX)    . tumor removal  2015     Family History  Problem Relation Age of Onset  . Heart disease Mother   . Hypertension Mother   . Cancer - Other Mother   . Bipolar disorder Mother   . Heart disease Father   . Hypertension Father   . Diabetes Father   . Parkinson's disease Maternal Grandmother   . Cancer Maternal Aunt   . Cancer Maternal Uncle   . Cancer Maternal Grandfather   . Lupus Paternal Grandmother   . Hypertension Brother     Social History   Tobacco Use  . Smoking status: Never Smoker  . Smokeless tobacco: Never Used  Substance Use Topics  . Alcohol use: Not Currently  .  Drug use: Not Currently    Types: Marijuana    Comment: pain managment last used in early april     Allergies  Allergen Reactions  . Penicillin G Itching, Rash, Anaphylaxis and Palpitations  . Carbamazepine Other (See Comments)    Medication interaction-prograf  . Hydrocodone-Acetaminophen Itching  . Naproxen Itching    Current Outpatient Medications  Medication Sig Dispense Refill  . baclofen (LIORESAL) 20 MG tablet Take 1 tablet (20 mg total) by mouth 3 (three) times daily as needed for muscle spasms. 90 each 2  . benztropine (COGENTIN) 0.5 MG tablet Take 1 tablet (0.5 mg total) by mouth daily as needed for tremors. 30 tablet 1  . Cranberry 500 MG TABS Take by mouth.    . DULoxetine (CYMBALTA) 20 MG capsule Take 1 capsule (20 mg total) by mouth daily. 30 capsule 1  . hydrOXYzine (VISTARIL) 25 MG capsule Take 1 capsule (25 mg total) by mouth 2 (two) times daily as needed. For sever anxiety attacks 60 capsule 1  . lisinopril (ZESTRIL) 20 MG tablet Take 1 tablet  (20 mg total) by mouth 1 day or 1 dose. 90 tablet 1  . Magnesium Bisglycinate (MAG GLYCINATE) 100 MG TABS Take by mouth.    . Melatonin 5 MG CAPS Take 1-2 capsules (5-10 mg total) by mouth at bedtime. 100 capsule 0  . metFORMIN (GLUCOPHAGE) 1000 MG tablet Take 1 tablet by mouth 2 (two) times a day.    . mirabegron ER (MYRBETRIQ) 50 MG TB24 tablet Take 50 mg by mouth daily.    . mycophenolate (CELLCEPT) 250 MG capsule Take 1 capsule by mouth 2 (two) times a day.    . oxyCODONE (OXYCONTIN) 10 mg 12 hr tablet Take by mouth.    . pregabalin (LYRICA) 200 MG capsule Take 1 capsule (200 mg total) by mouth 3 (three) times daily. 90 capsule 2  . promethazine (PHENERGAN) 25 MG tablet Take 1 tablet by mouth as needed.    [name] QUEtiapine Fumarate (SEROQUEL XR) 150 MG 24 hr tablet Take 1 tablet (150 mg total) by mouth at bedtime. 90 tablet 0  . Specialty Vitamins Products (MAGNESIUM, AMINO ACID CHELATE,) 133 MG tablet Take 1 tablet by mouth 2 (two) times daily.    . SUMAtriptan (IMITREX) 100 MG tablet TAKE 1 TABLET BY MOUTH ONCE AS NEEDED FOR MIGRAINE. MAY TAKE A SECOND DOSE AFTER 2 HOURS IF NEEDED    . tacrolimus (PROGRAF) 1 MG capsule Take 1 capsule by mouth once daily 30 capsule 0   No current facility-administered medications for this visit.      REVIEW OF SYSTEMS (Negative unless checked)  Constitutional: _0 Weight loss  _1 Fever  _2 Chills Cardiac: _3 Chest pain   _4 Chest pressure   _5 Palpitations   _6 Shortness of breath when laying flat   _7 Shortness of breath at rest   _8 Shortness of breath with exertion. Vascular:  _9 Pain in legs with walking   _10 Pain in legs at rest   _11 Pain in legs when laying flat   _12 Claudication   _13 Pain in feet when walking  _14 Pain in feet at rest  _15 Pain in feet when laying flat   _16 History of DVT   _17 Phlebitis   _18 Swelling in legs   _19 Varicose veins   _20 Non-healing ulcers Pulmonary:   _21 Uses home oxygen   _22 Productive cough   _23 Hemoptysis   _24 Wheeze  _25 COPD    _26 Asthma Neurologic:  _27 Dizziness  _28 Blackouts   _29 Seizures   _30 History of stroke   _31 History of TIA  _32 Aphasia   _33   Temporary blindness   _0 Dysphagia   _1 Weakness or numbness in arms   _2 Weakness or numbness in legs Musculoskeletal:  _3 Arthritis   _4 Joint swelling   _5 Joint pain   _6 Low back pain Hematologic:  _7 Easy bruising  _8 Easy bleeding   _9 Hypercoagulable state   _10 Anemic  _11 Hepatitis Gastrointestinal:  _12 Blood in stool   _13 Vomiting blood  _14 Gastroesophageal reflux/heartburn   _15 Abdominal pain Genitourinary:  _16 Chronic kidney disease   _17 Difficult urination  _18 Frequent urination  _19 Burning with urination   _20 Hematuria Skin:  _21 Rashes   _22 Ulcers   _23 Wounds Psychological:  _24 History of anxiety   _25  History of major depression.    Physical Exam BP 139/83 (BP Location: Left Arm)   Pulse 73   Resp 16   Wt 273 lb (123.8 kg)   LMP 02/04/2018 (Approximate) Comment: Per patient, over a year ago  BMI 42.76 kg/m  Gen:  WD/WN, NAD.  Head: [place]/AT, No temporalis wasting.  Ear/Nose/Throat: Hearing grossly intact, nares w/o erythema or drainage, oropharynx w/o Erythema/Exudate Eyes: Conjunctiva clear, sclera non-icteric  Neck: trachea midline.  No JVD.  Pulmonary:  Good air movement, respirations not labored, no use of accessory muscles  Cardiac: RRR, no JVD Vascular:  Vessel Right Left  Radial Palpable Palpable                          DP 2+ 1+  PT 2+ Trace    Gastrointestinal:. No masses, surgical incisions, or scars. Musculoskeletal: M/S 5/5 throughout.  Extremities without ischemic changes.  No deformity or atrophy.  1+ left lower extremity edema. Neurologic: Sensation grossly intact in extremities.  Symmetrical.  Speech is fluent. Motor exam as listed above. Psychiatric: Judgment intact, Mood & affect appropriate for pt's clinical situation. Dermatologic: No rashes or ulcers noted.  No cellulitis or open wounds.    Radiology US Venous Img Lower Unilateral Left  Result  Date: 09/16/2019 CLINICAL DATA:  Left lower extremity edema x3 days EXAM: LEFT LOWER EXTREMITY VENOUS DOPPLER ULTRASOUND TECHNIQUE: Gray-scale sonography with compression, as well as color and duplex ultrasound, were performed to evaluate the deep venous system from the level of the common femoral vein through the popliteal and proximal calf veins. Technologist describes technically difficult study secondary to calf edema. COMPARISON:  None FINDINGS: Normal compressibility of the common femoral, superficial femoral, and popliteal veins, as well as the proximal calf veins. No filling defects to suggest DVT on grayscale or color Doppler imaging. Doppler waveforms show normal direction of venous flow, normal respiratory phasicity and response to augmentation. Subcutaneous edema in the calf. Survey views of the contralateral common femoral vein are unremarkable. IMPRESSION: No femoropopliteal and no calf DVT in the visualized calf veins. If clinical symptoms are inconsistent or if there are persistent or worsening symptoms, further imaging (possibly involving the iliac veins) may be warranted. Electronically Signed   By: Lucrezia Europe M.D.   On: 09/16/2019 16:05    Labs Recent Results (from the past 2160 hour(s))  Glucose, capillary     Status: Abnormal   Collection Time: 07/08/19 10:04 AM  Result Value Ref Range   Glucose-Capillary 126 (H) 70 - 99 mg/dL  Lactate dehydrogenase     Status: None   Collection Time: 07/09/19 10:11 AM  Result Value Ref Range   LDH 122 98 - 192 U/L    Comment: Performed at [facility], [address] [place], [place] [address]  Comprehensive metabolic panel     Status: Abnormal  Collection Time: 07/09/19 10:11 AM  Result Value Ref Range   Sodium 138 135 - 145 mmol/L   Potassium 4.3 3.5 - 5.1 mmol/L   Chloride 105 98 - 111 mmol/L   CO2 25 22 - 32 mmol/L   Glucose, Bld 192 (H) 70 - 99 mg/dL   BUN 22 (H) 6 - 20 mg/dL   Creatinine, Ser 0.76 0.44 - 1.00 mg/dL    Calcium 8.9 8.9 - 10.3 mg/dL   Total Protein 6.7 6.5 - 8.1 g/dL   Albumin 3.4 (L) 3.5 - 5.0 g/dL   AST 23 15 - 41 U/L   ALT 23 0 - 44 U/L   Alkaline Phosphatase 55 38 - 126 U/L   Total Bilirubin 0.6 0.3 - 1.2 mg/dL   GFR calc non Af Amer >60 >60 mL/min   GFR calc Af Amer >60 >60 mL/min   Anion gap 8 5 - 15    Comment: Performed at [facility], [address] [place], [place] [address]  CBC with Differential/Platelet     Status: Abnormal   Collection Time: 07/09/19 10:11 AM  Result Value Ref Range   WBC 5.6 4.0 - 10.5 K/uL   RBC 4.24 3.87 - 5.11 MIL/uL   Hemoglobin 12.2 12.0 - 15.0 g/dL   HCT 37.0 36.0 - 46.0 %   MCV 87.3 80.0 - 100.0 fL   MCH 28.8 26.0 - 34.0 pg   MCHC 33.0 30.0 - 36.0 g/dL   RDW 13.4 11.5 - 15.5 %   Platelets 146 (L) 150 - 400 K/uL   nRBC 0.0 0.0 - 0.2 %   Neutrophils Relative % 55 %   Neutro Abs 3.1 1.7 - 7.7 K/uL   Lymphocytes Relative 24 %   Lymphs Abs 1.4 0.7 - 4.0 K/uL   Monocytes Relative 9 %   Monocytes Absolute 0.5 0.1 - 1.0 K/uL   Eosinophils Relative 10 %   Eosinophils Absolute 0.6 (H) 0.0 - 0.5 K/uL   Basophils Relative 1 %   Basophils Absolute 0.1 0.0 - 0.1 K/uL   Immature Granulocytes 1 %   Abs Immature Granulocytes 0.03 0.00 - 0.07 K/uL    Comment: Performed at [facility], [address] [place], [place] [address]  Comprehensive metabolic panel     Status: Abnormal   Collection Time: 07/12/19 11:42 AM  Result Value Ref Range   Sodium 135 135 - 145 mmol/L   Potassium 4.5 3.5 - 5.1 mmol/L   Chloride 100 98 - 111 mmol/L   CO2 23 22 - 32 mmol/L   Glucose, Bld 154 (H) 70 - 99 mg/dL   BUN 24 (H) 6 - 20 mg/dL   Creatinine, Ser 0.90 0.44 - 1.00 mg/dL   Calcium 9.5 8.9 - 10.3 mg/dL   Total Protein 7.4 6.5 - 8.1 g/dL   Albumin 3.9 3.5 - 5.0 g/dL   AST 21 15 - 41 U/L   ALT 23 0 - 44 U/L   Alkaline Phosphatase 65 38 - 126 U/L   Total Bilirubin 0.8 0.3 - 1.2 mg/dL   GFR calc non Af Amer >60 >60 mL/min   GFR calc Af Amer >60  >60 mL/min   Anion gap 12 5 - 15    Comment: Performed at [facility], [address] [place], [place] [address]  CBC with Differential     Status: Abnormal   Collection Time: 07/12/19 11:42 AM  Result Value Ref Range   WBC 7.6 4.0 - 10.5  K/uL   RBC 4.93 3.87 - 5.11 MIL/uL   Hemoglobin 14.4 12.0 - 15.0 g/dL   HCT 42.2 36.0 - 46.0 %   MCV 85.6 80.0 - 100.0 fL   MCH 29.2 26.0 - 34.0 pg   MCHC 34.1 30.0 - 36.0 g/dL   RDW 13.2 11.5 - 15.5 %   Platelets 156 150 - 400 K/uL   nRBC 0.0 0.0 - 0.2 %   Neutrophils Relative % 61 %   Neutro Abs 4.7 1.7 - 7.7 K/uL   Lymphocytes Relative 19 %   Lymphs Abs 1.5 0.7 - 4.0 K/uL   Monocytes Relative 8 %   Monocytes Absolute 0.6 0.1 - 1.0 K/uL   Eosinophils Relative 10 %   Eosinophils Absolute 0.8 (H) 0.0 - 0.5 K/uL   Basophils Relative 1 %   Basophils Absolute 0.1 0.0 - 0.1 K/uL   Immature Granulocytes 1 %   Abs Immature Granulocytes 0.05 0.00 - 0.07 K/uL    Comment: Performed at [facility], [address] [place], [place] [address]  Urinalysis, Complete w Microscopic     Status: Abnormal   Collection Time: 07/12/19  2:38 PM  Result Value Ref Range   Color, Urine STRAW (A) YELLOW   APPearance CLEAR (A) CLEAR   Specific Gravity, Urine 1.008 1.005 - 1.030   pH 6.0 5.0 - 8.0   Glucose, UA NEGATIVE NEGATIVE mg/dL   Hgb urine dipstick NEGATIVE NEGATIVE   Bilirubin Urine NEGATIVE NEGATIVE   Ketones, ur NEGATIVE NEGATIVE mg/dL   Protein, ur NEGATIVE NEGATIVE mg/dL   Nitrite NEGATIVE NEGATIVE   Leukocytes,Ua SMALL (A) NEGATIVE   RBC / HPF 0-5 0 - 5 RBC/hpf   WBC, UA 11-20 0 - 5 WBC/hpf   Bacteria, UA RARE (A) NONE SEEN   Squamous Epithelial / LPF NONE SEEN 0 - 5   Mucus PRESENT     Comment: Performed at [facility], [address] [place], [place] [address]  Hemoglobin A1C     Status: Abnormal   Collection Time: 07/15/19  3:36 PM  Result Value Ref Range   Hgb A1c MFr Bld 6.6 (H) 4.8 - 5.6 %    Comment:           Prediabetes: 5.7 - 6.4          Diabetes: >6.4          Glycemic control for adults with diabetes: <7.0    Est. average glucose Bld gHb Est-mCnc 143 mg/dL  Thyroid Panel With TSH     Status: None   Collection Time: 07/15/19  3:36 PM  Result Value Ref Range   TSH 2.650 0.450 - 4.500 uIU/mL   T4, Total 6.4 4.5 - 12.0 ug/dL   T3 Uptake Ratio 24 24 - 39 %   Free Thyroxine Index 1.5 1.2 - 4.9  Prolactin     Status: None   Collection Time: 07/15/19  3:36 PM  Result Value Ref Range   Prolactin 13.4 4.8 - 23.3 ng/mL  Urine Culture     Status: None   Collection Time: 07/30/19 12:00 AM   Specimen: Urine  Result Value Ref Range   MICRO NUMBER: [id number]    SPECIMEN QUALITY: Adequate    Sample Source URINE    STATUS: FINAL    Result:      Growth of mixed flora was isolated, suggesting probable contamination. No further testing will be performed. If clinically indicated, recollection using a method to minimize contamination, with  prompt transfer to Urine Culture Transport Tube, is  recommended.   POCT urinalysis dipstick     Status: Abnormal   Collection Time: 07/30/19  8:43 AM  Result Value Ref Range   Color, UA yellow    Clarity, UA clear    Glucose, UA Negative Negative   Bilirubin, UA negative    Ketones, UA negative    Spec Grav, UA 1.015 1.010 - 1.025   Blood, UA trace    pH, UA 5.0 5.0 - 8.0   Protein, UA Positive (A) Negative   Urobilinogen, UA negative (A) 0.2 or 1.0 E.U./dL   Nitrite, UA negative    Leukocytes, UA Negative Negative   Appearance clear    Odor negatiive   Urinalysis, Complete     Status: Abnormal   Collection Time: 08/25/19  2:36 PM  Result Value Ref Range   Specific Gravity, UA 1.025 1.005 - 1.030   pH, UA 6.0 5.0 - 7.5   Color, UA Yellow Yellow   Appearance Ur Clear Clear   Leukocytes,UA Negative Negative   Protein,UA 1+ (A) Negative/Trace   Glucose, UA Negative Negative   Ketones, UA Negative Negative   RBC, UA Negative Negative    Bilirubin, UA Negative Negative   Urobilinogen, Ur 0.2 0.2 - 1.0 mg/dL   Nitrite, UA Negative Negative   Microscopic Examination See below:   Microscopic Examination     Status: None   Collection Time: 08/25/19  2:36 PM   URINE  Result Value Ref Range   WBC, UA 0-5 0 - 5 /hpf   RBC None seen 0 - 2 /hpf   Epithelial Cells (non renal) 0-10 0 - 10 /hpf   Bacteria, UA Few None seen/Few  Bladder Scan (Post Void Residual) in office     Status: None   Collection Time: 08/25/19  2:45 PM  Result Value Ref Range   Scan Result 0ML   PTT     Status: None   Collection Time: 09/16/19  3:40 PM  Result Value Ref Range   aPTT 34 24 - 36 seconds    Comment: Performed at [facility], [address] [place], [place] [address]  INR/PT     Status: None   Collection Time: 09/16/19  3:40 PM  Result Value Ref Range   Prothrombin Time 12.4 11.4 - 15.2 seconds   INR 0.9 0.8 - 1.2    Comment: (NOTE) INR goal varies based on device and disease states. Performed at [facility], [address] [place], [place] [address]   Comprehensive metabolic panel     Status: Abnormal   Collection Time: 09/16/19  3:40 PM  Result Value Ref Range   Sodium 138 135 - 145 mmol/L   Potassium 4.2 3.5 - 5.1 mmol/L   Chloride 103 98 - 111 mmol/L   CO2 24 22 - 32 mmol/L   Glucose, Bld 121 (H) 70 - 99 mg/dL   BUN 19 6 - 20 mg/dL   Creatinine, Ser 1.02 (H) 0.44 - 1.00 mg/dL   Calcium 9.2 8.9 - 10.3 mg/dL   Total Protein 7.6 6.5 - 8.1 g/dL   Albumin 3.9 3.5 - 5.0 g/dL   AST 28 15 - 41 U/L   ALT 28 0 - 44 U/L   Alkaline Phosphatase 66 38 - 126 U/L   Total Bilirubin 0.5 0.3 - 1.2 mg/dL   GFR calc non Af Amer >60 >60 mL/min   GFR calc Af Amer >60 >60 mL/min  Anion gap 11 5 - 15    Comment: Performed at [facility], [address] [place], [place] [address]  CBC with Differential/Platelet     Status: Abnormal   Collection Time: 09/16/19  3:40 PM  Result Value Ref Range   WBC 7.5 4.0  - 10.5 K/uL   RBC 4.46 3.87 - 5.11 MIL/uL   Hemoglobin 13.0 12.0 - 15.0 g/dL   HCT 38.3 36.0 - 46.0 %   MCV 85.9 80.0 - 100.0 fL   MCH 29.1 26.0 - 34.0 pg   MCHC 33.9 30.0 - 36.0 g/dL   RDW 13.2 11.5 - 15.5 %   Platelets 138 (L) 150 - 400 K/uL   nRBC 0.0 0.0 - 0.2 %   Neutrophils Relative % 65 %   Neutro Abs 4.9 1.7 - 7.7 K/uL   Lymphocytes Relative 19 %   Lymphs Abs 1.4 0.7 - 4.0 K/uL   Monocytes Relative 8 %   Monocytes Absolute 0.6 0.1 - 1.0 K/uL   Eosinophils Relative 6 %   Eosinophils Absolute 0.5 0.0 - 0.5 K/uL   Basophils Relative 1 %   Basophils Absolute 0.1 0.0 - 0.1 K/uL   Immature Granulocytes 1 %   Abs Immature Granulocytes 0.05 0.00 - 0.07 K/uL    Comment: Performed at [facility], [address], [place], [place] [address]    Assessment/Plan:  Essential hypertension blood pressure control important in reducing the progression of atherosclerotic disease. On appropriate oral medications.   S/P liver transplant ([place]) Chronic liver disease will certainly worsen lower extremity swelling  Diabetes mellitus (HCC) blood glucose control important in reducing the progression of atherosclerotic disease. Also, involved in wound healing. On appropriate medications.   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[place]) Lymphoma would certainly increase her likelihood of developing lymphedema which is likely present at this point.  Edema of lower extremity I have had a long discussion with the patient regarding swelling and why it  causes symptoms.  Patient will begin wearing graduated compression stockings class 1 (20-30 mmHg) on a daily basis a prescription was given. The patient will  beginning wearing the stockings first thing in the morning and removing them in the evening. The patient is instructed specifically not to sleep in the stockings.   In addition, behavioral modification will be initiated.  This will include frequent elevation, use of over the counter  pain medications and exercise such as walking.  I have reviewed systemic causes for chronic edema such as liver, kidney and cardiac etiologies.  The patient denies problems with these organ systems.    Consideration for a lymph pump will also be made based upon the effectiveness of conservative therapy.  This would help to improve the edema control and prevent sequela such as ulcers and infections   Patient should undergo duplex ultrasound of the venous system to ensure that DVT or reflux is not present.  The patient will follow-up with me after the ultrasound.        Leotis Pain 09/21/2019, 11:04 AM   This note was created with Dragon medical transcription system.  Any errors from dictation are unintentional.

## 2019-09-21 NOTE — Patient Instructions (Signed)
Edema  Edema is when you have too much fluid in your body or under your skin. Edema may make your legs, feet, and ankles swell up. Swelling is also common in looser tissues, like around your eyes. This is a common condition. It gets more common as you get older. There are many possible causes of edema. Eating too much salt (sodium) and being on your feet or sitting for a long time can cause edema in your legs, feet, and ankles. Hot weather may make edema worse. Edema is usually painless. Your skin may look swollen or shiny. Follow these instructions at home:  Keep the swollen body part raised (elevated) above the level of your heart when you are sitting or lying down.  Do not sit still or stand for a long time.  Do not wear tight clothes. Do not wear garters on your upper legs.  Exercise your legs. This can help the swelling go down.  Wear elastic bandages or support stockings as told by your doctor.  Eat a low-salt (low-sodium) diet to reduce fluid as told by your doctor.  Depending on the cause of your swelling, you may need to limit how much fluid you drink (fluid restriction).  Take over-the-counter and prescription medicines only as told by your doctor. Contact a doctor if:  Treatment is not working.  You have heart, liver, or kidney disease and have symptoms of edema.  You have sudden and unexplained weight gain. Get help right away if:  You have shortness of breath or chest pain.  You cannot breathe when you lie down.  You have pain, redness, or warmth in the swollen areas.  You have heart, liver, or kidney disease and get edema all of a sudden.  You have a fever and your symptoms get worse all of a sudden. Summary  Edema is when you have too much fluid in your body or under your skin.  Edema may make your legs, feet, and ankles swell up. Swelling is also common in looser tissues, like around your eyes.  Raise (elevate) the swollen body part above the level of your  heart when you are sitting or lying down.  Follow your doctor's instructions about diet and how much fluid you can drink (fluid restriction). This information is not intended to replace advice given to you by your health care provider. Make sure you discuss any questions you have with your health care provider. Document Released: 03/11/2008 Document Revised: 09/26/2017 Document Reviewed: 10/11/2016 Elsevier Patient Education  2020 [employer]  

## 2019-09-21 NOTE — Assessment & Plan Note (Signed)
Chronic liver disease will certainly worsen lower extremity swelling

## 2019-09-22 ENCOUNTER — Ambulatory Visit (INDEPENDENT_AMBULATORY_CARE_PROVIDER_SITE_OTHER): Payer: Medicaid Other

## 2019-09-22 ENCOUNTER — Ambulatory Visit: Payer: Medicaid Other | Admitting: Physician Assistant

## 2019-09-22 ENCOUNTER — Encounter (INDEPENDENT_AMBULATORY_CARE_PROVIDER_SITE_OTHER): Payer: Self-pay | Admitting: Nurse Practitioner

## 2019-09-22 ENCOUNTER — Encounter: Payer: Self-pay | Admitting: Physician Assistant

## 2019-09-22 ENCOUNTER — Ambulatory Visit (INDEPENDENT_AMBULATORY_CARE_PROVIDER_SITE_OTHER): Payer: Medicaid Other | Admitting: Nurse Practitioner

## 2019-09-22 VITALS — BP 146/82 | HR 88 | Resp 16 | Ht 67.0 in | Wt 275.0 lb

## 2019-09-22 DIAGNOSIS — R600 Localized edema: Secondary | ICD-10-CM | POA: Diagnosis not present

## 2019-09-22 DIAGNOSIS — I10 Essential (primary) hypertension: Secondary | ICD-10-CM | POA: Diagnosis not present

## 2019-09-22 DIAGNOSIS — I890 Lymphedema, not elsewhere classified: Secondary | ICD-10-CM

## 2019-09-23 ENCOUNTER — Encounter: Payer: Self-pay | Admitting: Dietician

## 2019-09-23 ENCOUNTER — Encounter: Payer: Medicaid Other | Attending: Surgery | Admitting: Dietician

## 2019-09-23 ENCOUNTER — Other Ambulatory Visit: Payer: Self-pay

## 2019-09-23 VITALS — Ht 67.0 in | Wt 276.7 lb

## 2019-09-23 DIAGNOSIS — E119 Type 2 diabetes mellitus without complications: Secondary | ICD-10-CM | POA: Diagnosis not present

## 2019-09-23 DIAGNOSIS — Z713 Dietary counseling and surveillance: Secondary | ICD-10-CM | POA: Insufficient documentation

## 2019-09-23 DIAGNOSIS — F3181 Bipolar II disorder: Secondary | ICD-10-CM | POA: Diagnosis not present

## 2019-09-23 NOTE — Progress Notes (Signed)
Medical Nutrition Therapy: Visit start time: 1430  end time: 1530  Assessment:  Diagnosis: Type 2 diabetes Past medical history: CKD stage 3, liver transplant, lymphoma, HTN, gout, sleep apnea, fibromyalgia, sjogren's syndrome Psychosocial issues/ stress concerns: depression, anxiety  Preferred learning method:  Nicki [place] . Hands-on   Current weight: 276.7lbs with jacket and shoes Height: 5'7" Medications, supplements: reconciled list in medical record  Progress and evaluation:   Patient reports avoiding sugar free beverages, uses splenda if needed; occasionally has milkshake as a treat   She is unable to stand long to cook or clean dishes, so has not been including many vegetables.   She follows lacto-vegetarian diet-- no meat, fish, eggs. Does eat dairy. Limits large amounts of soy products as she has experienced gout flare-ups when doing so.  Poor dentition-- has 6 teeth, is unable to chew hard foods.   Patient reports weight gain of about 50lbs since completion of chemotherapy for lymphoma. She had lost down to 225lbs prior to then, and states her diabetes went into remission. Weight loss is proving more difficult now, however.  Physical activity: walks across parking lot in apt complex to get mail daily. Has metal detector and plans to begin using soon.   Dietary Intake:  Usual eating pattern includes 2 meals and 0-1 snacks per day. Dining out frequency: 1 meals per week.  Breakfast: sometimes skips due to lack of hunger; oatmeal or cream of wheat or grits; if very hungry, biscuit with gravy Snack: usu none Lunch: 12/17-- veggie sub ate 2 chips (hard to chew); 12/16 potpie with vegetarian meat alternative. Almond or cashew butter with sf jelly (b) Snack: usu none Supper: lasagna with garlic sticks; vegetarian soy patties ie [employer] chicken patties Snack: occ ice cream sandwich or bar Beverages: water, tea with splenda, or soda with splenda, diet soda ( carbonation  helps with dry mouth due to sjogren's syndrome  Nutrition Care Education: Topics covered:  Basic nutrition: basic food groups, appropriate nutrient balance, appropriate meal and snack schedule, general nutrition guidelines    Weight control: estimated energy needs for weight loss at 1300kcal, provided guidance for 50% CHO, 20% protein, and 30% fat. Discussed monitoring food intake, appropriate food portions. Diabetes:  appropriate meal and snack schedule, appropriate carb intake and balance, healthy carb choices, role of fiber, protein, fat; advised protein source with each meal and small-moderate amount of healthy fat and discussed vegetarian sources. Other: discussed benefits of increased vegetable and fruit intake on kcal control, fiber, anti-inflammatory benefit.   Nutritional Diagnosis:  Morris-2.2 Altered nutrition-related laboratory As related to Type 2 diabetes.  As evidenced by patient with recent HbA1C of 6.6%. [place]-3.3 Overweight/obesity As related to limited mobility and excess calories.  As evidenced by patient with current BMI of 43.34.  Intervention:   Instruction and discussion as noted above.  Patient has been working on diet and lifestyle changes to lose weight and improve BGs.  Established goals to ensure adequate protein intake, and to eat at regular intervals for consistent BG control.   Education Materials given:  . General diet guidelines for Diabetes . Plate Planner with food lists, sample meal pattern . Planning A Balanced Meal . sample menus . Guidelines for Gout . Vegetarian Proteins . Goals/ instructions   Learner/ who was taught:  . Patient   Level of understanding: [name] Verbalizes/ demonstrates competency   Demonstrated degree of understanding via:   Teach back Learning barriers: . None  Willingness to learn/ readiness for change: .  Eager, change in progress  Monitoring and Evaluation:  Dietary intake, exercise, BG control, and body weight       follow up: 10/28/19 at 1:30pm

## 2019-09-23 NOTE — Patient Instructions (Signed)
   Plan to have something to eat every 4-5 hours during the day.   Include a source of protein with each meal/ snack -- cheese, yogurt, veggie-based meat substitute, nut butter, or supplemental protein powder or shake.  A small amount of healthy fat especially canola, olive, avocado, or nut/seed-based oil is good to include with meals also, to reduce the effect of carbs on blood sugar.   Great job limiting sugar and increasing exercise! Keep up the great work!

## 2019-09-24 ENCOUNTER — Ambulatory Visit
Admission: EM | Admit: 2019-09-24 | Discharge: 2019-09-24 | Disposition: A | Discharge status: Home / Self Care | Payer: Medicaid Other | Attending: Family Medicine | Admitting: Family Medicine

## 2019-09-24 ENCOUNTER — Other Ambulatory Visit: Payer: Self-pay

## 2019-09-24 ENCOUNTER — Ambulatory Visit: Payer: Medicaid Other

## 2019-09-24 ENCOUNTER — Encounter: Payer: Self-pay | Admitting: Emergency Medicine

## 2019-09-24 ENCOUNTER — Encounter: Payer: Self-pay | Admitting: Family Medicine

## 2019-09-24 DIAGNOSIS — M25559 Pain in unspecified hip: Secondary | ICD-10-CM | POA: Diagnosis not present

## 2019-09-24 DIAGNOSIS — M79652 Pain in left thigh: Secondary | ICD-10-CM | POA: Diagnosis not present

## 2019-09-24 DIAGNOSIS — M25551 Pain in right hip: Secondary | ICD-10-CM | POA: Diagnosis not present

## 2019-09-24 DIAGNOSIS — M7989 Other specified soft tissue disorders: Secondary | ICD-10-CM | POA: Diagnosis not present

## 2019-09-24 DIAGNOSIS — M79605 Pain in left leg: Secondary | ICD-10-CM | POA: Diagnosis not present

## 2019-09-24 DIAGNOSIS — R2242 Localized swelling, mass and lump, left lower limb: Secondary | ICD-10-CM

## 2019-09-24 NOTE — ED Provider Notes (Signed)
[facility]    CSN: [id number] Arrival date & time: 09/24/19  1749      History   Chief Complaint Chief Complaint  Patient presents with  . Leg Pain    left    HPI Alison [name] is a 43 y.o. female.   43 yo female with a c/o left leg pain for the past week. States she had two ultrasounds done through her PCP which were negative for DVT. Denies any falls or injuries, fevers, chills, shortness of breath, chest pains. Patient states she has a h/o lymphoma, has received chemotherapy and is scheduled to follow up with her oncologist next month.    Leg Pain   Past Medical History:  Diagnosis Date  . Abnormal uterine bleeding   . Allergy   . Anxiety   . Arthritis   . Bipolar disorder (manic depression) ([place])   . Chronic kidney failure   . Chronic renal disease, stage III   . Diabetes mellitus without complication ([place])   . 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 FH: trigeminal neuralgia   . GERD (gastroesophageal reflux disease)   . Heart murmur   . Hypertension   . Kidney mass   . Lupus ([place])   . Lupus ([place])   . lymphoma   . Lymphoma ([place])   . Lymphoma ([place])   . Major depressive disorder   . Migraine   . Morbid obesity ([place])   . Neuromuscular disorder (H[facility])    neuropathy  . Neuropathy   . Personality disorder ([place])   . Post herpetic neuralgia   . PTSD (post-traumatic stress disorder)   . Renal disorder   . S/P liver transplant [facility])     Patient Active Problem List   Diagnosis Date Noted  . Edema of lower extremity 09/16/2019  . Carpal tunnel syndrome, left 07/26/2019  . Cubital tunnel syndrome on left 07/26/2019  . Bipolar I disorder, most recent episode depressed ([place]) 07/12/2019  . PTSD (post-traumatic stress disorder) 07/12/2019  . Umbilical hernia without obstruction and without gangrene 06/17/2019  . Encounter for surveillance of abnormal nevi 06/17/2019  . Pineal gland cyst 06/17/2019  . Chronic hip pain, left 06/02/2019  .  Lumbar spondylosis 06/02/2019  . Mouth dryness 06/02/2019  . Obesity (BMI 35.0-39.9 without comorbidity) 06/02/2019  . Goals of care, counseling/discussion 05/18/2019  . 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[place]) 05/18/2019  . Occipital neuralgia 05/13/2019  . Plantar fasciitis of left foot 04/27/2019  . Fibromyalgia 03/23/2019  . Systemic lupus erythematosus (SLE) in adult ([place]) 03/23/2019  . Essential hypertension 03/23/2019  . Hepatitis 03/23/2019  . Mass of left kidney 03/23/2019  . Diabetes mellitus ([place]) 03/23/2019  . Nausea without vomiting 03/23/2019  . Neuropathy 03/23/2019  . Cancer ([place]) 03/23/2019  . Hyperlipidemia 03/23/2019  . Osteoarthritis 03/23/2019  . Trigeminal neuralgia 03/23/2019  . Chronic renal disease, stage III 03/23/2019  . Nephrolithiasis 03/23/2019  . Migraine 03/23/2019  . OSA on CPAP 03/23/2019  . Fibrocystic breast 03/23/2019  . Heart murmur 03/23/2019  . Bipolar disorder (manic depression) ([place]) 07/02/2017  . Abnormal uterine bleeding 03/18/2017  . Major depressive disorder, recurrent ([place]) 03/18/2017  . Morbid obesity ([place]) 03/18/2017  . Secondary hyperparathyroidism ([place]) 02/07/2017  . Neuropathic pain 11/19/2016  . Post herpetic neuralgia 11/19/2016  . 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 Lumbar disc disease with radiculopathy 07/26/2013  .  S/P liver transplant ([place]) 07/26/2013  . Type 2 diabetes mellitus, uncontrolled ([place]) 07/26/2013  . Facial nerve disorder 06/29/2012  . Low back pain 12/26/2010    Past Surgical History:  Procedure Laterality Date  . BONE MARROW BIOPSY  01/13/2015  . BREAST BIOPSY  12/2014  . BREAST BIOPSY  2011  . BREAST SURGERY    . CHOLECYSTECTOMY    . HERNIA REPAIR    . infusaport    . LIVER TRANSPLANT  12/17/1991  . LUMBAR PUNCTURE    . PORT A CATH INJECTION ([place] [facility])    . tumor removal   2015    OB History    Gravida  0   Para  0   Term  0   Preterm  0   AB  0   Living  0     SAB  0   TAB  0   Ectopic  0   Multiple  0   Live Births  0            Home Medications    Prior to Admission medications   Medication Sig Start Date End Date Taking? Authorizing Provider  baclofen (LIORESAL) 20 MG tablet Take 1 tablet (20 mg total) by mouth 3 (three) times daily as needed for muscle spasms. 09/16/19  Yes [name], FNP  benztropine (COGENTIN) 0.5 MG tablet Take 1 tablet (0.5 mg total) by mouth daily as needed for tremors. 09/10/19  Yes [name], MD  Cranberry 500 MG TABS Take by mouth.   Yes [provider]  DULoxetine (CYMBALTA) 20 MG capsule Take 1 capsule (20 mg total) by mouth daily. 09/10/19  Yes [name], MD  hydrOXYzine (VISTARIL) 25 MG capsule Take 1 capsule (25 mg total) by mouth 2 (two) times daily as needed. For sever anxiety attacks 08/11/19  Yes [name], [name], MD  lisinopril (ZESTRIL) 20 MG tablet Take 1 tablet (20 mg total) by mouth 1 day or 1 dose. 04/05/19  Yes [name], FNP  Magnesium Bisglycinate (MAG GLYCINATE) 100 MG TABS Take by mouth.   Yes [provider]  Melatonin 5 MG CAPS Take 1-2 capsules (5-10 mg total) by mouth at bedtime. 09/10/19  Yes [name], MD  metFORMIN (GLUCOPHAGE) 1000 MG tablet Take 1 tablet by mouth 2 (two) times a day.   Yes [provider]  mirabegron ER (MYRBETRIQ) 50 MG TB24 tablet Take 50 mg by mouth daily.   Yes [provider]  mycophenolate (CELLCEPT) 250 MG capsule Take 1 capsule by mouth 2 (two) times a day.   Yes [provider]  oxyCODONE (OXYCONTIN) 10 mg 12 hr tablet Take by mouth. 09/08/19  Yes [provider]  pregabalin (LYRICA) 200 MG capsule Take 1 capsule (200 mg total) by mouth 3 (three) times daily. 07/05/19 10/03/19 Yes [name], MD  promethazine (PHENERGAN) 25 MG tablet Take 1 tablet by mouth as needed.   Yes  [provider]  QUEtiapine Fumarate (SEROQUEL XR) 150 MG 24 hr tablet Take 1 tablet (150 mg total) by mouth at bedtime. 08/11/19  Yes [name], [name], MD  SUMAtriptan (IMITREX) 100 MG tablet TAKE 1 TABLET BY MOUTH ONCE AS NEEDED FOR MIGRAINE. MAY TAKE A SECOND DOSE AFTER 2 HOURS IF NEEDED 05/19/19  Yes [provider]  tacrolimus (PROGRAF) 1 MG capsule Take 1 capsule by mouth once daily 07/08/19  Yes [name], FNP  Specialty Vitamins Products (MAGNESIUM, AMINO ACID CHELATE,) 133 MG tablet Take  1 tablet by mouth 2 (two) times daily.    [provider]    Family History Family History  Problem Relation Age of Onset  . Heart disease Mother   . Hypertension Mother   . Cancer - Other Mother   . Bipolar disorder Mother   . Heart disease Father   . Hypertension Father   . Diabetes Father   . Parkinson's disease Maternal Grandmother   . Cancer Maternal Aunt   . Cancer Maternal Uncle   . Cancer Maternal Grandfather   . Lupus Paternal Grandmother   . Hypertension Brother     Social History Social History   Tobacco Use  . Smoking status: Never Smoker  . Smokeless tobacco: Never Used  Substance Use Topics  . Alcohol use: Not Currently  . Drug use: Not Currently    Types: Marijuana    Comment: pain managment last used in early april     Allergies   Penicillin g, Carbamazepine, Hydrocodone-acetaminophen, and Naproxen   Review of Systems Review of Systems   Physical Exam Triage Vital Signs ED Triage Vitals  Enc Vitals Group     BP 09/24/19 1802 123/90     Pulse Rate 09/24/19 1802 92     Resp 09/24/19 1802 16     Temp 09/24/19 1802 98.7 F (37.1 C)     Temp Source 09/24/19 1802 Oral     SpO2 09/24/19 1802 96 %     Weight 09/24/19 1759 275 lb (124.7 kg)     Height 09/24/19 1759 _0  (1.702 m)     Head Circumference --      Peak Flow --      Pain Score 09/24/19 1759 4     Pain Loc --      Pain Edu? --      Excl. in [place]? --    No data  found.  Updated Vital Signs BP 123/90 (BP Location: Left Arm)   Pulse 92   Temp 98.7 F (37.1 C) (Oral)   Resp 16   Ht _1  (1.702 m)   Wt 124.7 kg   LMP 02/04/2018 (Approximate) Comment: Per patient, over a year ago  SpO2 96%   BMI 43.07 kg/m   Visual Acuity Right Eye Distance:   Left Eye Distance:   Bilateral Distance:    Right Eye Near:   Left Eye Near:    Bilateral Near:     Physical Exam Vitals and nursing note reviewed.  Constitutional:      General: She is not in acute distress.    Appearance: She is not toxic-appearing or diaphoretic.  Pulmonary:     Effort: Pulmonary effort is normal. No respiratory distress.  Musculoskeletal:        General: Swelling and tenderness (along the thigh and hip area) present. Normal range of motion.     Left lower leg: Edema present.  Neurological:     Mental Status: She is alert.      UC Treatments / Results  Labs (all labs ordered are listed, but only abnormal results are displayed) Labs Reviewed - No data to display  EKG   Radiology DG FEMUR MIN 2 VIEWS LEFT  Result Date: 09/24/2019 CLINICAL DATA:  Left thigh pain for 1 week. No reported acute injury. EXAM: LEFT FEMUR 2 VIEWS COMPARISON:  None. FINDINGS: The mineralization and alignment are normal. There is no evidence of acute fracture or dislocation. The joint spaces are maintained at the left hip and knee.  No evidence of femoral head avascular necrosis or significant knee joint effusion. IMPRESSION: Normal radiographs of the left thigh. Electronically Signed   By: Richardean Sale M.D.   On: 09/24/2019 19:46    Procedures Procedures (including critical care time)  Medications Ordered in UC Medications - No data to display  Initial Impression / Assessment and Plan / UC Course  I have reviewed the triage vital signs and the nursing notes.  Pertinent labs & imaging results that were available during my care of the patient were reviewed by me and considered in my  medical decision making (see chart for details).      Final Clinical Impressions(s) / UC Diagnoses   Final diagnoses:  Hip pain  Left leg swelling  Left leg pain     Discharge Instructions     Follow up with PCP and oncologist    ED Prescriptions    None      1. x-ray results and diagnosis reviewed with patient 2. rx as per orders above; reviewed possible side effects, interactions, risks and benefits  3. Follow up with oncologist and PCP  4. Follow-up prn   PDMP not reviewed this encounter.   Norval Gable, MD 09/24/19 [phone]

## 2019-09-24 NOTE — ED Triage Notes (Signed)
Patient c/o left leg pain that started a week ago.  Patient denies injury or fall.  Patient states that she had an US of the left leg on 09/16/19 and the result was Negative for DVT.

## 2019-09-24 NOTE — Discharge Instructions (Signed)
Follow up with PCP and oncologist

## 2019-09-25 ENCOUNTER — Encounter: Payer: Self-pay | Admitting: Oncology

## 2019-09-27 MED ORDER — MIRABEGRON ER 50 MG PO TB24: 50.0000 mg | ORAL_TABLET | Freq: Every day | ORAL | 6 refills | Status: DC

## 2019-09-28 ENCOUNTER — Encounter (INDEPENDENT_AMBULATORY_CARE_PROVIDER_SITE_OTHER): Payer: Self-pay | Admitting: Nurse Practitioner

## 2019-09-28 ENCOUNTER — Encounter (INDEPENDENT_AMBULATORY_CARE_PROVIDER_SITE_OTHER): Payer: Medicaid Other | Admitting: Vascular Surgery

## 2019-09-28 ENCOUNTER — Encounter: Payer: Self-pay | Admitting: Oncology

## 2019-09-28 NOTE — Progress Notes (Signed)
SUBJECTIVE:  Patient ID: Alison [name], female    DOB: 21-Oct-1975, 43 y.o.   MRN: [id number] Chief Complaint  Patient presents with  . Follow-up    ultrasound    HPI  Alison [name] is a 43 y.o. female the presents today for evaluation of leg swelling.  The patient has a previous medical history of lymphoma as well as some lymphedema in her right breast and arm from a sentinel node biopsy.  The swelling in her left lower extremity has become progressively worse over the last few months.  Is heavy and tired and painful at times.  There is no clear inciting event.  Patient denies any fever, chills, nausea, vomiting or diarrhea.  The patient does wear medical grade 1 compression stockings, elevation of lower extremities and attempts to exercise on a daily basis.  The patient has been doing this conservative therapy for at least 4 weeks.  Noninvasive studies today show there is no evidence of DVT in the left lower extremity.  There is also no evidence of chronic venous insufficiency in the left lower extremity.  There is no evidence of superficial venous thrombosis.  Past Medical History:  Diagnosis Date  . Abnormal uterine bleeding   . Allergy   . Anxiety   . Arthritis   . Bipolar disorder (manic depression) ([place])   . Chronic kidney failure   . Chronic renal disease, stage III   . Diabetes mellitus without complication ([place])   . 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[place])   . FH: trigeminal neuralgia   . GERD (gastroesophageal reflux disease)   . Heart murmur   . Hypertension   . Kidney mass   . Lupus ([place])   . Lupus ([place])   . lymphoma   . Lymphoma ([place])   . Lymphoma ([place])   . Major depressive disorder   . Migraine   . Morbid obesity ([place])   . Neuromuscular disorder ([facility])    neuropathy  . Neuropathy   . Personality disorder ([place])   . Post herpetic neuralgia   . PTSD (post-traumatic stress disorder)   . Renal disorder   . S/P liver transplant [facility])     Past Surgical  History:  Procedure Laterality Date  . BONE MARROW BIOPSY  01/13/2015  . BREAST BIOPSY  12/2014  . BREAST BIOPSY  2011  . BREAST SURGERY    . CHOLECYSTECTOMY    . HERNIA REPAIR    . infusaport    . LIVER TRANSPLANT  12/17/1991  . LUMBAR PUNCTURE    . PORT A CATH INJECTION ([facility])    . tumor removal  2015    Social History   Socioeconomic History  . Marital status: Single    Spouse name: Not on file  . Number of children: 0  . Years of education: 9  . Highest education level: GED or equivalent  Occupational History  . Occupation: disabled  Tobacco Use  . Smoking status: Never Smoker  . Smokeless tobacco: Never Used  Substance and Sexual Activity  . Alcohol use: Not Currently  . Drug use: Not Currently    Types: Marijuana    Comment: pain managment last used in early april  . Sexual activity: Not Currently  Other Topics Concern  . Not on file  Social History Narrative  . Not on file   Social Determinants of Health   Financial Resource Strain: Medium Risk  . Difficulty of Paying Living Expenses: Somewhat hard  Food Insecurity: Food  Insecurity Present  . Worried About Charity fundraiser in the Last Year: Sometimes true  . Ran Out of Food in the Last Year: Sometimes true  Transportation Needs: Unmet Transportation Needs  . Lack of Transportation (Medical): Yes  . Lack of Transportation (Non-Medical): Yes  Physical Activity: Insufficiently Active  . Days of Exercise per Week: 1 day  . Minutes of Exercise per Session: 30 min  Stress: Stress Concern Present  . Feeling of Stress : Rather much  Social Connections: Somewhat Isolated  . Frequency of Communication with Friends and Family: Once a week  . Frequency of Social Gatherings with Friends and Family: Three times a week  . Attends Religious Services: More than 4 times per year  . Active Member of Clubs or Organizations: No  . Attends Archivist Meetings: Never  . Marital Status: Never married    Intimate Partner Violence: Not At Risk  . Fear of Current or Ex-Partner: No  . Emotionally Abused: No  . Physically Abused: No  . Sexually Abused: No    Family History  Problem Relation Age of Onset  . Heart disease Mother   . Hypertension Mother   . Cancer - Other Mother   . Bipolar disorder Mother   . Heart disease Father   . Hypertension Father   . Diabetes Father   . Parkinson's disease Maternal Grandmother   . Cancer Maternal Aunt   . Cancer Maternal Uncle   . Cancer Maternal Grandfather   . Lupus Paternal Grandmother   . Hypertension Brother     Allergies  Allergen Reactions  . Penicillin G Itching, Rash, Anaphylaxis and Palpitations  . Carbamazepine Other (See Comments)    Medication interaction-prograf  . Hydrocodone-Acetaminophen Itching  . Naproxen Itching     Review of Systems   Review of Systems: Negative Unless Checked Constitutional: [] Weight loss  [] Fever  [] Chills Cardiac: [] Chest pain   []  Atrial Fibrillation  [] Palpitations   [] Shortness of breath when laying flat   [] Shortness of breath with exertion. [] Shortness of breath at rest Vascular:  [x] Pain in legs with walking   [] Pain in legs with standing [] Pain in legs when laying flat   [] Claudication    [] Pain in feet when laying flat    [] History of DVT   [] Phlebitis   [x] Swelling in legs   [x] Varicose veins   [] Non-healing ulcers Pulmonary:   [] Uses home oxygen   [] Productive cough   [] Hemoptysis   [] Wheeze  [] COPD   [] Asthma Neurologic:  [] Dizziness   [] Seizures  [] Blackouts [] History of stroke   [] History of TIA  [] Aphasia   [] Temporary Blindness   [] Weakness or numbness in arm   [] Weakness or numbness in leg Musculoskeletal:   [] Joint swelling   [] Joint pain   [] Low back pain  []  History of Knee Replacement [x] Arthritis [] back Surgeries  []  Spinal Stenosis    Hematologic:  [] Easy bruising  [] Easy bleeding   [] Hypercoagulable state   [] Anemic Gastrointestinal:  [] Diarrhea   [] Vomiting   [] Gastroesophageal reflux/heartburn   [] Difficulty swallowing. [] Abdominal pain Genitourinary:  [] Chronic kidney disease   [] Difficult urination  [] Anuric   [] Blood in urine [] Frequent urination  [] Burning with urination   [] Hematuria Skin:  [] Rashes   [] Ulcers [] Wounds Psychological:  [] History of anxiety   []  History of major depression  []  Memory Difficulties      OBJECTIVE:   Physical Exam  BP (!) 146/82 (BP Location: Left Arm)   Pulse 88   Resp 16  Ht 5' 7"  (1.702 m)   Wt 275 lb (124.7 kg)   LMP 02/04/2018 (Approximate) Comment: Per patient, over a year ago  BMI 43.07 kg/m   Gen: WD/WN, NAD Head: [place]/AT, No temporalis wasting.  Ear/Nose/Throat: Hearing grossly intact, nares w/o erythema or drainage Eyes: PER, EOMI, sclera nonicteric.  Neck: Supple, no masses.  No JVD.  Pulmonary:  Good air movement, no use of accessory muscles.  Cardiac: RRR Vascular: 2+ soft edema bilaterally  Vessel Right Left  Radial Palpable Palpable  Dorsalis Pedis Palpable Palpable  Posterior Tibial Palpable Palpable   Gastrointestinal: soft, non-distended. No guarding/no peritoneal signs.  Musculoskeletal: M/S 5/5 throughout.  No deformity or atrophy.  Neurologic: Pain and light touch intact in extremities.  Symmetrical.  Speech is fluent. Motor exam as listed above. Psychiatric: Judgment intact, Mood & affect appropriate for pt's clinical situation. Dermatologic: mild stasis dermatitis bilaterally. No Ulcers Noted.  No changes consistent with cellulitis. Lymph : No Cervical lymphadenopathy, dermal thickening       ASSESSMENT AND PLAN:  1. Lymphedema Recommend:  No surgery or intervention at this point in time.    I have reviewed my previous discussion with the patient regarding swelling and why it causes symptoms.  Patient will continue wearing graduated compression stockings class 1 (20-30 mmHg) on a daily basis. The patient will  beginning wearing the stockings first thing in the morning  and removing them in the evening. The patient is instructed specifically not to sleep in the stockings.    In addition, behavioral modification including several periods of elevation of the lower extremities during the day will be continued.  This was reviewed with the patient during the initial visit.  The patient will also continue routine exercise, especially walking on a daily basis as was discussed during the initial visit.    Despite conservative treatments including graduated compression therapy class 1 and behavioral modification including exercise and elevation the patient  has not obtained adequate control of the lymphedema.  The patient still has stage 3 lymphedema and therefore, I believe that a lymph pump should be added to improve the control of the patient's lymphedema.  Additionally, a lymph pump is warranted because it will reduce the risk of cellulitis and ulceration in the future.  Patient should follow-up in six months    2. Essential hypertension Continue antihypertensive medications as already ordered, these medications have been reviewed and there are no changes at this time.    Current Outpatient Medications on File Prior to Visit  Medication Sig Dispense Refill  . baclofen (LIORESAL) 20 MG tablet Take 1 tablet (20 mg total) by mouth 3 (three) times daily as needed for muscle spasms. 90 each 2  . benztropine (COGENTIN) 0.5 MG tablet Take 1 tablet (0.5 mg total) by mouth daily as needed for tremors. 30 tablet 1  . Cranberry 500 MG TABS Take by mouth.    . DULoxetine (CYMBALTA) 20 MG capsule Take 1 capsule (20 mg total) by mouth daily. 30 capsule 1  . hydrOXYzine (VISTARIL) 25 MG capsule Take 1 capsule (25 mg total) by mouth 2 (two) times daily as needed. For sever anxiety attacks 60 capsule 1  . lisinopril (ZESTRIL) 20 MG tablet Take 1 tablet (20 mg total) by mouth 1 day or 1 dose. 90 tablet 1  . Magnesium Bisglycinate (MAG GLYCINATE) 100 MG TABS Take by mouth.    .  Melatonin 5 MG CAPS Take 1-2 capsules (5-10 mg total) by mouth at bedtime. 100  capsule 0  . metFORMIN (GLUCOPHAGE) 1000 MG tablet Take 1 tablet by mouth 2 (two) times a day.    . mycophenolate (CELLCEPT) 250 MG capsule Take 1 capsule by mouth 2 (two) times a day.    . oxyCODONE (OXYCONTIN) 10 mg 12 hr tablet Take by mouth.    . pregabalin (LYRICA) 200 MG capsule Take 1 capsule (200 mg total) by mouth 3 (three) times daily. 90 capsule 2  . promethazine (PHENERGAN) 25 MG tablet Take 1 tablet by mouth as needed.    [name] QUEtiapine Fumarate (SEROQUEL XR) 150 MG 24 hr tablet Take 1 tablet (150 mg total) by mouth at bedtime. 90 tablet 0  . Specialty Vitamins Products (MAGNESIUM, AMINO ACID CHELATE,) 133 MG tablet Take 1 tablet by mouth 2 (two) times daily.    . SUMAtriptan (IMITREX) 100 MG tablet TAKE 1 TABLET BY MOUTH ONCE AS NEEDED FOR MIGRAINE. MAY TAKE A SECOND DOSE AFTER 2 HOURS IF NEEDED    . tacrolimus (PROGRAF) 1 MG capsule Take 1 capsule by mouth once daily 30 capsule 0  . mirabegron ER (MYRBETRIQ) 50 MG TB24 tablet Take 1 tablet (50 mg total) by mouth daily. 30 tablet 6   No current facility-administered medications on file prior to visit.    There are no Patient Instructions on file for this visit. No follow-ups on file.   Kris Hartmann, NP  This note was completed with Sales executive.  Any errors are purely unintentional.

## 2019-09-29 ENCOUNTER — Other Ambulatory Visit: Payer: Self-pay

## 2019-09-29 ENCOUNTER — Encounter: Payer: Self-pay | Admitting: Oncology

## 2019-09-29 ENCOUNTER — Ambulatory Visit
Admission: RE | Admit: 2019-09-29 | Discharge: 2019-09-29 | Disposition: A | Discharge status: Home / Self Care | Payer: Medicaid Other | Source: Ambulatory Visit | Attending: Oncology | Admitting: Oncology

## 2019-09-29 ENCOUNTER — Inpatient Hospital Stay: Payer: Medicaid Other | Attending: Oncology | Admitting: Oncology

## 2019-09-29 ENCOUNTER — Other Ambulatory Visit: Payer: Self-pay | Admitting: Oncology

## 2019-09-29 VITALS — BP 138/91 | HR 72 | Temp 97.1°F | Resp 16 | Wt 274.0 lb

## 2019-09-29 DIAGNOSIS — R102 Pelvic and perineal pain: Secondary | ICD-10-CM | POA: Diagnosis not present

## 2019-09-29 DIAGNOSIS — M1990 Unspecified osteoarthritis, unspecified site: Secondary | ICD-10-CM | POA: Diagnosis not present

## 2019-09-29 DIAGNOSIS — M7989 Other specified soft tissue disorders: Secondary | ICD-10-CM | POA: Insufficient documentation

## 2019-09-29 DIAGNOSIS — Z944 Liver transplant status: Secondary | ICD-10-CM | POA: Insufficient documentation

## 2019-09-29 DIAGNOSIS — N3281 Overactive bladder: Secondary | ICD-10-CM | POA: Diagnosis not present

## 2019-09-29 DIAGNOSIS — C8580 Other specified types of non-Hodgkin lymphoma, unspecified site: Secondary | ICD-10-CM

## 2019-09-29 DIAGNOSIS — I129 Hypertensive chronic kidney disease with stage 1 through stage 4 chronic kidney disease, or unspecified chronic kidney disease: Secondary | ICD-10-CM | POA: Diagnosis not present

## 2019-09-29 DIAGNOSIS — C884 Extranodal marginal zone B-cell lymphoma of mucosa-associated lymphoid tissue [MALT-lymphoma]: Secondary | ICD-10-CM | POA: Diagnosis not present

## 2019-09-29 DIAGNOSIS — F3181 Bipolar II disorder: Secondary | ICD-10-CM | POA: Diagnosis not present

## 2019-09-29 DIAGNOSIS — N183 Chronic kidney disease, stage 3 unspecified: Secondary | ICD-10-CM | POA: Diagnosis not present

## 2019-09-29 DIAGNOSIS — K754 Autoimmune hepatitis: Secondary | ICD-10-CM | POA: Diagnosis not present

## 2019-09-29 DIAGNOSIS — Z7984 Long term (current) use of oral hypoglycemic drugs: Secondary | ICD-10-CM | POA: Diagnosis not present

## 2019-09-29 DIAGNOSIS — M25562 Pain in left knee: Secondary | ICD-10-CM | POA: Diagnosis not present

## 2019-09-29 DIAGNOSIS — Z9221 Personal history of antineoplastic chemotherapy: Secondary | ICD-10-CM | POA: Diagnosis not present

## 2019-09-29 DIAGNOSIS — Z79899 Other long term (current) drug therapy: Secondary | ICD-10-CM | POA: Insufficient documentation

## 2019-09-29 DIAGNOSIS — R590 Localized enlarged lymph nodes: Secondary | ICD-10-CM | POA: Insufficient documentation

## 2019-09-29 MED ORDER — IOHEXOL 300 MG/ML  SOLN
100.0000 mL | Freq: Once | INTRAMUSCULAR | Status: AC | PRN
  Administered 2019-09-29: 11:00:00 100 mL via INTRAVENOUS

## 2019-09-29 NOTE — Progress Notes (Signed)
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  She is currently on CellCept and tacrolimus for the same.  She also has a history of post transplant lymphoproliferative disorder/DLBCL that was diagnosed in 2016.  She is s/p 6 cycles of R-CHOP chemotherapy back then and was in complete remission 1.  Her other past medical history significant for migraines, stage III chronic kidney disease, chemo-induced peripheral neuropathy, hypertension among other medical problems.  With regards to her diffuse large B-cell lymphoma she was getting surveillance scans and this was all done in [place].  She has now moved to [facility] to be with her significant other.  Patient was noted to have a prior kidney mass before which was biopsied and was not consistent with malignancy.   She underwent CT neck with contrast before she left [place] on 02/22/2019.  She was noted to have soft tissue masses in both the parotid glands which were new as compared to prior exams and possibly represent lymph nodes.  The largest one was seen in the left parotid gland measuring 1.3 x 1.1 cm.  Before she could get a work-up for this patient moved to [place] and has not had any further work-up yet   Her other prior imaging is as follows: MRI thoracic spine without contrast in August 2019 showed moderate multilevel spondylosis but no evidence of lymphoma.  Multinodular thyroid  in June 2019.  MRI cervical spine May 2019 again showed spondylosis but no other acute pathology.  MRI brain showed incidental partial opacification of the right and right sinus.  No evidence of malignancy.  I do not have any other PET CT scan or treatment records from the past.   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     Interval history-patient presents to symptom management with above history for complaints of persistent left leg swelling and pain x2 weeks.  She was evaluated in urgent care on 09/24/2019 for same complaint.  She had 2 ultrasounds completed by PCP which were negative for DVT, venous insufficieny or superficial clot.  She has been wearing a medical grade 1 compression stocking as well as elevating left lower extremity with no resolution. She denies any falls or injuries, fevers, chills, shortness of breath or chest pain.  Additional x-rays were performed revealing no acute abnormality.  She was asked to follow-up with her oncologist given her history of lymphoma.  Today, patient states the left leg swelling has been stable.  She experiences the pain typically with movement, mostly walking.  She rates the  pain a 2 out of 10 with most sensitivity down the posterior side of her left leg.  Has pain under left buttocks extending towards her groin.  Denies any palpable lymphadenopathy.  Has not taken anything for the pain.  Has been using a compression stocking on left leg.  States swelling is better in the morning and worse at bedtime.  She denies  any injury or recent exercise.  She denies any shortness of breath or chest pain.  She has normal bowel movements last was this morning.  She has somewhat unusual urinary habits due to overactive bladder syndrome.  She was evaluated by Dr. Lucky Cowboy on 09/21/2019 who discussed wearing a medical grade class I compression stocking on a daily basis.    ECOG FS:1 - Symptomatic but completely ambulatory  Review of systems- Review of Systems  Constitutional: Negative.  Negative for chills, fever, malaise/fatigue and weight loss.       Obese  HENT: Negative for congestion, ear pain and tinnitus.   Eyes: Negative.  Negative for blurred vision and double vision.  Respiratory: Negative.  Negative for cough, sputum production and shortness of breath.   Cardiovascular: Negative.  Negative for chest pain, palpitations and leg swelling.  Gastrointestinal: Negative.  Negative for abdominal pain, constipation, diarrhea, nausea and vomiting.  Genitourinary: Negative for dysuria, frequency and urgency.  Musculoskeletal: Negative for back pain and falls.       Left-sided leg pain and swelling  Skin: Negative.  Negative for rash.  Neurological: Negative.  Negative for weakness and headaches.  Endo/Heme/Allergies: Negative.  Does not bruise/bleed easily.  Psychiatric/Behavioral: Negative.  Negative for depression. The patient is not nervous/anxious and does not have insomnia.      Current treatment- s/p 4 cycles of Rituxan.  Allergies  Allergen Reactions  . Penicillin G Itching, Rash, Anaphylaxis and Palpitations  . Carbamazepine Other (See Comments)    Medication interaction-prograf  . Hydrocodone-Acetaminophen Itching  . Naproxen Itching     Past Medical History:  Diagnosis Date  . Abnormal uterine bleeding   . Allergy   . Anxiety   . Arthritis   . Bipolar disorder (manic depression) ([place])   . Chronic kidney failure   . Chronic renal disease, stage III   . Diabetes mellitus without complication  ([place])   . 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[place]) 2015   Right axillary lymph node resected and chemo tx's.  . FH: trigeminal neuralgia   . GERD (gastroesophageal reflux disease)   . Heart murmur   . Hypertension   . Kidney mass   . Lupus ([place])   . Lupus ([place])   . Major depressive disorder   . 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 Migraine   . Morbid obesity ([place])   . Neuromuscular disorder ([facility])    neuropathy  . Neuropathy   . Personality disorder ([place])   . Post herpetic neuralgia   . PTSD (post-traumatic stress disorder)   . Renal disorder   . S/P liver transplant [facility])      Past Surgical History:  Procedure Laterality Date  . BONE MARROW BIOPSY  01/13/2015  . BREAST BIOPSY  12/2014  . BREAST BIOPSY  2011  . BREAST SURGERY    . CHOLECYSTECTOMY    . HERNIA REPAIR    . infusaport    . LIVER TRANSPLANT  12/17/1991  . LUMBAR PUNCTURE    . PORT A CATH INJECTION ([place] HX)    . tumor  removal  2015    Social History   Socioeconomic History  . Marital status: Single    Spouse name: Not on file  . Number of children: 0  . Years of education: 9  . Highest education level: GED or equivalent  Occupational History  . Occupation: disabled  Tobacco Use  . Smoking status: Never Smoker  . Smokeless tobacco: Never Used  Substance and Sexual Activity  . Alcohol use: Not Currently  . Drug use: Not Currently    Types: Marijuana    Comment: pain managment last used in early april  . Sexual activity: Not Currently  Other Topics Concern  . Not on file  Social History Narrative  . Not on file   Social Determinants of Health   Financial Resource Strain: Medium Risk  . Difficulty of Paying Living Expenses: Somewhat hard  Food Insecurity: Food Insecurity Present  . Worried About Charity fundraiser in the Last Year: Sometimes true  . Ran Out of Food in the Last Year: Sometimes true  Transportation Needs: Unmet Transportation Needs  . Lack of  Transportation (Medical): Yes  . Lack of Transportation (Non-Medical): Yes  Physical Activity: Insufficiently Active  . Days of Exercise per Week: 1 day  . Minutes of Exercise per Session: 30 min  Stress: Stress Concern Present  . Feeling of Stress : Rather much  Social Connections: Somewhat Isolated  . Frequency of Communication with Friends and Family: Once a week  . Frequency of Social Gatherings with Friends and Family: Three times a week  . Attends Religious Services: More than 4 times per year  . Active Member of Clubs or Organizations: No  . Attends Archivist Meetings: Never  . Marital Status: Never married  Intimate Partner Violence: Not At Risk  . Fear of Current or Ex-Partner: No  . Emotionally Abused: No  . Physically Abused: No  . Sexually Abused: No    Family History  Problem Relation Age of Onset  . Heart disease Mother   . Hypertension Mother   . Cancer - Other Mother   . Bipolar disorder Mother   . Heart disease Father   . Hypertension Father   . Diabetes Father   . Parkinson's disease Maternal Grandmother   . Cancer Maternal Aunt   . Cancer Maternal Uncle   . Cancer Maternal Grandfather   . Lupus Paternal Grandmother   . Hypertension Brother      Current Outpatient Medications:  .  baclofen (LIORESAL) 20 MG tablet, Take 1 tablet (20 mg total) by mouth 3 (three) times daily as needed for muscle spasms., Disp: 90 each, Rfl: 2 .  benztropine (COGENTIN) 0.5 MG tablet, Take 1 tablet (0.5 mg total) by mouth daily as needed for tremors., Disp: 30 tablet, Rfl: 1 .  Cranberry 500 MG TABS, Take by mouth., Disp: , Rfl:  .  DULoxetine (CYMBALTA) 20 MG capsule, Take 1 capsule (20 mg total) by mouth daily., Disp: 30 capsule, Rfl: 1 .  hydrOXYzine (VISTARIL) 25 MG capsule, Take 1 capsule (25 mg total) by mouth 2 (two) times daily as needed. For sever anxiety attacks, Disp: 60 capsule, Rfl: 1 .  lisinopril (ZESTRIL) 20 MG tablet, Take 1 tablet (20 mg total) by  mouth 1 day or 1 dose., Disp: 90 tablet, Rfl: 1 .  Magnesium Bisglycinate (MAG GLYCINATE) 100 MG TABS, Take by mouth., Disp: , Rfl:  .  Melatonin 5 MG CAPS, Take 1-2 capsules (5-10 mg total) by mouth at  bedtime., Disp: 100 capsule, Rfl: 0 .  metFORMIN (GLUCOPHAGE) 1000 MG tablet, Take 1 tablet by mouth 2 (two) times a day., Disp: , Rfl:  .  mirabegron ER (MYRBETRIQ) 50 MG TB24 tablet, Take 1 tablet (50 mg total) by mouth daily., Disp: 30 tablet, Rfl: 6 .  mycophenolate (CELLCEPT) 250 MG capsule, Take 1 capsule by mouth 2 (two) times a day., Disp: , Rfl:  .  oxyCODONE (OXYCONTIN) 10 mg 12 hr tablet, Take by mouth., Disp: , Rfl:  .  pregabalin (LYRICA) 200 MG capsule, Take 1 capsule (200 mg total) by mouth 3 (three) times daily., Disp: 90 capsule, Rfl: 2 .  promethazine (PHENERGAN) 25 MG tablet, Take 1 tablet by mouth as needed., Disp: , Rfl:  .  QUEtiapine Fumarate (SEROQUEL XR) 150 MG 24 hr tablet, Take 1 tablet (150 mg total) by mouth at bedtime., Disp: 90 tablet, Rfl: 0 .  Specialty Vitamins Products (MAGNESIUM, AMINO ACID CHELATE,) 133 MG tablet, Take 1 tablet by mouth 2 (two) times daily., Disp: , Rfl:  .  SUMAtriptan (IMITREX) 100 MG tablet, TAKE 1 TABLET BY MOUTH ONCE AS NEEDED FOR MIGRAINE. MAY TAKE A SECOND DOSE AFTER 2 HOURS IF NEEDED, Disp: , Rfl:  .  tacrolimus (PROGRAF) 1 MG capsule, Take 1 capsule by mouth once daily, Disp: 30 capsule, Rfl: 0  Physical exam:  Vitals:   09/29/19 0909  BP: (!) 138/91  Pulse: 72  Resp: 16  Temp: (!) 97.1 F (36.2 C)  TempSrc: Tympanic  SpO2: 100%  Weight: 274 lb (124.3 kg)   Physical Exam Constitutional:      Appearance: Normal appearance.  HENT:     Head: Normocephalic and atraumatic.  Eyes:     Pupils: Pupils are equal, round, and reactive to light.  Cardiovascular:     Rate and Rhythm: Normal rate and regular rhythm.     Heart sounds: Normal heart sounds. No murmur.  Pulmonary:     Effort: Pulmonary effort is normal.     Breath  sounds: Normal breath sounds. No wheezing.  Abdominal:     General: Bowel sounds are normal. There is no distension.     Palpations: Abdomen is soft.     Tenderness: There is no abdominal tenderness.  Musculoskeletal:        General: Normal range of motion.     Cervical back: Normal range of motion.     Left upper leg: Swelling and tenderness present.     Comments: Radiates around towards left buttucks  Skin:    General: Skin is warm and dry.     Findings: No rash.  Neurological:     Mental Status: She is alert and oriented to person, place, and time.  Psychiatric:        Judgment: Judgment normal.      CMP Latest Ref Rng & Units 09/16/2019  Glucose 70 - 99 mg/dL 121(H)  BUN 6 - 20 mg/dL 19  Creatinine 0.44 - 1.00 mg/dL 1.02(H)  Sodium 135 - 145 mmol/L 138  Potassium 3.5 - 5.1 mmol/L 4.2  Chloride 98 - 111 mmol/L 103  CO2 22 - 32 mmol/L 24  Calcium 8.9 - 10.3 mg/dL 9.2  Total Protein 6.5 - 8.1 g/dL 7.6  Total Bilirubin 0.3 - 1.2 mg/dL 0.5  Alkaline Phos 38 - 126 U/L 66  AST 15 - 41 U/L 28  ALT 0 - 44 U/L 28   CBC Latest Ref Rng & Units 09/16/2019  WBC 4.0 - 10.5  K/uL 7.5  Hemoglobin 12.0 - 15.0 g/dL 13.0  Hematocrit 36.0 - 46.0 % 38.3  Platelets 150 - 400 K/uL 138(L)    No images are attached to the encounter.  CT Pelvis W Contrast  Result Date: 09/29/2019 CLINICAL DATA:  LEFT lower extremity swelling with LEFT pain from buttock area 10 knee for 1 week. LEFT pelvic tightness when walking. History of diffuse large B-cell lymphoma in 2015 status post chemotherapy. EXAM: CT PELVIS WITH CONTRAST TECHNIQUE: Multidetector CT imaging of the pelvis was performed using the standard protocol following the bolus administration of intravenous contrast. CONTRAST:  14m OMNIPAQUE IOHEXOL 300 MG/ML  SOLN COMPARISON:  None. FINDINGS: Urinary Tract: Bladder appears normal, partially decompressed. Lower ureters are normal in caliber. Bowel: No dilated large or small bowel loops are seen  within the pelvis or lower abdomen. No evidence of bowel wall inflammation. Vascular/Lymphatic: Vascular structures of the pelvis appear normal. No enlarged lymph nodes appreciated within the pelvis. Reproductive: Heterogeneity of the uterine fundus, suspected fibroid. No adnexal mass or free fluid identified. Other: No free fluid or abscess collection identified. No free intraperitoneal air. Surgical changes suggestive of hernia repair in the RIGHT lower abdomen. Musculoskeletal: Superficial soft tissues of the pelvis and upper thighs are unremarkable. No soft tissue mass, fluid collection or evidence of soft tissue edema. No acute or suspicious osseous finding. No significant degenerative change at either hip joint. IMPRESSION: 1. No acute findings. Pelvic portion of the bowel appears normal. No free fluid or inflammatory change within the pelvis. No soft tissue mass, fluid collection or evidence of soft tissue edema. Vascular structures of the pelvis are unremarkable. No acute or suspicious osseous abnormality. 2. Heterogeneity of the uterine fundus, suspected fibroid. Consider further characterization with nonemergent pelvic ultrasound. Electronically Signed   By: SFranki CabotM.D.   On: 09/29/2019 11:18   [place] Img Lower Unilateral Left  Result Date: 09/16/2019 CLINICAL DATA:  Left lower extremity edema x3 days EXAM: LEFT LOWER EXTREMITY VENOUS DOPPLER ULTRASOUND TECHNIQUE: Gray-scale sonography with compression, as well as color and duplex ultrasound, were performed to evaluate the deep venous system from the level of the common femoral vein through the popliteal and proximal calf veins. Technologist describes technically difficult study secondary to calf edema. COMPARISON:  None FINDINGS: Normal compressibility of the common femoral, superficial femoral, and popliteal veins, as well as the proximal calf veins. No filling defects to suggest DVT on grayscale or color Doppler imaging. Doppler waveforms  show normal direction of venous flow, normal respiratory phasicity and response to augmentation. Subcutaneous edema in the calf. Survey views of the contralateral common femoral vein are unremarkable. IMPRESSION: No femoropopliteal and no calf DVT in the visualized calf veins. If clinical symptoms are inconsistent or if there are persistent or worsening symptoms, further imaging (possibly involving the iliac veins) may be warranted. Electronically Signed   By: DLucrezia EuropeM.D.   On: 09/16/2019 16:05   DG FEMUR MIN 2 VIEWS LEFT  Result Date: 09/24/2019 CLINICAL DATA:  Left thigh pain for 1 week. No reported acute injury. EXAM: LEFT FEMUR 2 VIEWS COMPARISON:  None. FINDINGS: The mineralization and alignment are normal. There is no evidence of acute fracture or dislocation. The joint spaces are maintained at the left hip and knee. No evidence of femoral head avascular necrosis or significant knee joint effusion. IMPRESSION: Normal radiographs of the left thigh. Electronically Signed   By: WRichardean SaleM.D.   On: 09/24/2019 19:46   VAS [place]  LOWER EXTREMITY VENOUS REFLUX  Result Date: 09/28/2019  Lower Venous Reflux Study Indications: Swelling, and left LE.  Performing Technologist: Concha [place] RVT  Examination Guidelines: A complete evaluation includes B-mode imaging, spectral Doppler, color Doppler, and power Doppler as needed of all accessible portions of each vessel. Bilateral testing is considered an integral part of a complete examination. Limited examinations for reoccurring indications may be performed as noted. The reflux portion of the exam is performed with the patient in reverse Trendelenburg.  +------+---------------+---------+-----------+----------+--------------+ LEFT  CompressibilityPhasicitySpontaneityPropertiesThrombus Aging +------+---------------+---------+-----------+----------+--------------+ CFV   Full           Yes      Yes                                  +------+---------------+---------+-----------+----------+--------------+ SFJ   Full           Yes      Yes                                 +------+---------------+---------+-----------+----------+--------------+ FV MidFull           Yes      Yes                                 +------+---------------+---------+-----------+----------+--------------+ POP   Full           Yes      Yes                                 +------+---------------+---------+-----------+----------+--------------+ GSV   Full                                                        +------+---------------+---------+-----------+----------+--------------+ SSV   Full                                                        +------+---------------+---------+-----------+----------+--------------+     Summary: Left: There is no evidence of deep vein thrombosis in the lower extremity. However, portions of this examination were limited- see technologist comments above. There is no evidence of chronic venous insufficiency.There is no evidence of superficial venous thrombosis.  *See table(s) above for measurements and observations. Electronically signed by Leotis Pain MD on 09/28/2019 at 8:44:15 AM.    Final      Assessment and plan- Patient is a 43 y.o. female who presents to symptom management for left posterior leg tenderness and swelling.  Symptoms have been present for several weeks.  History of marginal cell lymphoma: Status post 3 cycles of Rituxan (05/28/2019 through 06/18/2019).  Tolerated well.  Most recent imaging revealed no evidence of disease. Plan is for active surveillance.  She is scheduled to follow-up with Dr. Janese Banks on 10/11/2019.   Left lower extremity swelling and pain: Status post 2 - Dopplers and x-ray of left leg.  Unclear etiology.  She was also evaluated by vein and vascular  Dr. Lucky Cowboy who recommended compression stocking to left leg and follow-up with oncology.  On examination, no palpable inguinal  adenopathy bone unable to adequately assess due to body habitus.  We will get stat CT pelvis of left leg to evaluate lymph nodes in the pelvis.  Spoke with radiology to clarify best imaging and was recommended CT pelvis without oral contrast.  Patient agreeable.  Will call with results.  Plan: Assessment. Vital signs. CT pelvis no contrast- No acute abnormality.  Called and spoke to patient regarding findings of CT scan.  Still unclear etiology.  Could refer to Luna Fuse, OT to evaluate for lymphedema.  Will place referral.   Disposition: RTC on 10/11/2019 for evaluation and labs by Dr. Janese Banks. Referral to OT with Luna Fuse.   Visit Diagnosis 1. 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 Inguinal adenopathy    Patient expressed understanding and was in agreement with this plan. She also understands that She can call clinic at any time with any questions, concerns, or complaints.   Greater than 50% was spent in counseling and coordination of care with this patient including but not limited to discussion of the relevant topics above (See A&P) including, but not limited to diagnosis and management of acute and chronic medical conditions.   Thank you for allowing me to participate in the care of this very pleasant patient.   Jacquelin Hawking, NP [place] [facility] Cell - [phone] Pager- [phone] 09/30/2019 7:25 AM  Cc: Dr. Janese Banks

## 2019-09-29 NOTE — Telephone Encounter (Signed)
TY

## 2019-10-03 ENCOUNTER — Encounter: Payer: Self-pay | Admitting: Family Medicine

## 2019-10-04 DIAGNOSIS — G894 Chronic pain syndrome: Secondary | ICD-10-CM | POA: Diagnosis not present

## 2019-10-05 ENCOUNTER — Encounter: Payer: Self-pay | Admitting: Physician Assistant

## 2019-10-05 ENCOUNTER — Other Ambulatory Visit: Payer: Self-pay

## 2019-10-05 ENCOUNTER — Ambulatory Visit (INDEPENDENT_AMBULATORY_CARE_PROVIDER_SITE_OTHER): Payer: Medicaid Other | Admitting: Physician Assistant

## 2019-10-05 VITALS — BP 127/87 | HR 80 | Ht 67.0 in | Wt 274.0 lb

## 2019-10-05 DIAGNOSIS — N390 Urinary tract infection, site not specified: Secondary | ICD-10-CM | POA: Diagnosis not present

## 2019-10-05 DIAGNOSIS — N3281 Overactive bladder: Secondary | ICD-10-CM | POA: Diagnosis not present

## 2019-10-05 DIAGNOSIS — H524 Presbyopia: Secondary | ICD-10-CM | POA: Diagnosis not present

## 2019-10-05 DIAGNOSIS — N2889 Other specified disorders of kidney and ureter: Secondary | ICD-10-CM | POA: Diagnosis not present

## 2019-10-05 LAB — URINALYSIS, COMPLETE
Bilirubin, UA: NEGATIVE
Glucose, UA: NEGATIVE
Ketones, UA: NEGATIVE
Nitrite, UA: NEGATIVE
Protein,UA: NEGATIVE
Specific Gravity, UA: 1.02 (ref 1.005–1.030)
Urobilinogen, Ur: 0.2 mg/dL (ref 0.2–1.0)
pH, UA: 7 (ref 5.0–7.5)

## 2019-10-05 LAB — MICROSCOPIC EXAMINATION
RBC, Urine: NONE SEEN /hpf (ref 0–2)
WBC, UA: >30 /hpf — AB (ref 0–5)

## 2019-10-05 NOTE — Progress Notes (Signed)
10/05/2019 2:15 PM   Alison [name] [birth year] [id number]  CC: Symptom recheck  HPI: Alison [name] is a 43 y.o. female who presents today for symptom recheck.  She was seen by Dr. Erlene Quan approximately 6 weeks ago to establish care for several urologic problems, including suspicious renal mass s/p multiple biopsies, recurrent UTI, urinary frequency and urge incontinence.  She was started on Myrbetriq 50 mg daily at that time with plans to obtain outside medical records to further elucidate her history.  Today, she reports improvement of her OAB symptoms on Myrbetriq.  She does believe that she has a urinary infection today.  She reports dysuria, urinary urgency, increased urinary incontinence, and nocturnal enuresis x1 week associated with this.  She states she had some leftover Bactrim at home and is taken 3 days worth of this medication, however she ran out of it yesterday.  She states her symptoms have been slightly improved with this medication but are not resolved entirely.  Scanned medical records from her last urologist are unavailable for further review.  In-office UA today positive for trace-intact blood and 2+ leukocyte esterase; urine microscopy with >30 WBCs/HPF.  PMH: Past Medical History:  Diagnosis Date  . Abnormal uterine bleeding   . Allergy   . Anxiety   . Arthritis   . Bipolar disorder (manic depression) ([place])   . Chronic kidney failure   . Chronic renal disease, stage III   . Diabetes mellitus without complication ([place])   . 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  . FH: trigeminal neuralgia   . GERD (gastroesophageal reflux disease)   . Heart murmur   . Hypertension   . Kidney mass   . Lupus ([place])   . Lupus ([place])   . Major depressive disorder   . 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 Migraine   . Morbid obesity ([place])   . Neuromuscular disorder (HCC)    neuropathy   . Neuropathy   . Personality disorder ([place])   . Post herpetic neuralgia   . PTSD (post-traumatic stress disorder)   . Renal disorder   . S/P liver transplant [facility])     Surgical History: Past Surgical History:  Procedure Laterality Date  . BONE MARROW BIOPSY  01/13/2015  . BREAST BIOPSY  12/2014  . BREAST BIOPSY  2011  . BREAST SURGERY    . CHOLECYSTECTOMY    . HERNIA REPAIR    . infusaport    . LIVER TRANSPLANT  12/17/1991  . LUMBAR PUNCTURE    . PORT A CATH INJECTION ([place] HX)    . tumor removal  2015    Home Medications:  Allergies as of 10/05/2019      Reactions   Penicillin G Itching, Rash, Anaphylaxis, Palpitations   Carbamazepine Other (See Comments)   Medication interaction-prograf   Hydrocodone-acetaminophen Itching   Naproxen Itching      Medication List       Accurate as of October 05, 2019  2:15 PM. If you have any questions, ask your nurse or doctor.        baclofen 20 MG tablet Commonly known as: LIORESAL Take 1 tablet (20 mg total) by mouth 3 (three) times daily as needed for muscle spasms.   benztropine 0.5 MG tablet Commonly known as: COGENTIN Take 1 tablet (0.5 mg total) by mouth daily as needed for tremors.   Cranberry 500 MG Tabs Take  by mouth.   DULoxetine 20 MG capsule Commonly known as: CYMBALTA Take 1 capsule (20 mg total) by mouth daily.   hydrOXYzine 25 MG capsule Commonly known as: Vistaril Take 1 capsule (25 mg total) by mouth 2 (two) times daily as needed. For sever anxiety attacks   lisinopril 20 MG tablet Commonly known as: ZESTRIL Take 1 tablet (20 mg total) by mouth 1 day or 1 dose.   Mag Glycinate 100 MG Tabs Take by mouth.   magnesium (amino acid chelate) 133 MG tablet Take 1 tablet by mouth 2 (two) times daily.   Melatonin 5 MG Caps Take 1-2 capsules (5-10 mg total) by mouth at bedtime.   metFORMIN 1000 MG tablet Commonly known as: GLUCOPHAGE Take 1 tablet by mouth 2 (two) times a day.   mirabegron ER  50 MG Tb24 tablet Commonly known as: Myrbetriq Take 1 tablet (50 mg total) by mouth daily.   mycophenolate 250 MG capsule Commonly known as: CELLCEPT Take 1 capsule by mouth 2 (two) times a day.   oxyCODONE 10 mg 12 hr tablet Commonly known as: OXYCONTIN Take by mouth.   pregabalin 200 MG capsule Commonly known as: LYRICA Take 1 capsule (200 mg total) by mouth 3 (three) times daily.   promethazine 25 MG tablet Commonly known as: PHENERGAN Take 1 tablet by mouth as needed.   QUEtiapine Fumarate 150 MG 24 hr tablet Commonly known as: SEROquel XR Take 1 tablet (150 mg total) by mouth at bedtime.   SUMAtriptan 100 MG tablet Commonly known as: IMITREX TAKE 1 TABLET BY MOUTH ONCE AS NEEDED FOR MIGRAINE. MAY TAKE A SECOND DOSE AFTER 2 HOURS IF NEEDED   tacrolimus 1 MG capsule Commonly known as: PROGRAF Take 1 capsule by mouth once daily       Allergies:  Allergies  Allergen Reactions  . Penicillin G Itching, Rash, Anaphylaxis and Palpitations  . Carbamazepine Other (See Comments)    Medication interaction-prograf  . Hydrocodone-Acetaminophen Itching  . Naproxen Itching    Family History: Family History  Problem Relation Age of Onset  . Heart disease Mother   . Hypertension Mother   . Cancer - Other Mother   . Bipolar disorder Mother   . Heart disease Father   . Hypertension Father   . Diabetes Father   . Parkinson's disease Maternal Grandmother   . Cancer Maternal Aunt   . Cancer Maternal Uncle   . Cancer Maternal Grandfather   . Lupus Paternal Grandmother   . Hypertension Brother     Social History:   reports that she has never smoked. She has never used smokeless tobacco. She reports previous alcohol use. She reports previous drug use. Drug: Marijuana.  ROS: UROLOGY Frequent Urination?: Yes Hard to postpone urination?: Yes Burning/pain with urination?: Yes Get up at night to urinate?: Yes Leakage of urine?: Yes Urine stream starts and stops?:  Yes Trouble starting stream?: No Do you have to strain to urinate?: No Blood in urine?: No Urinary tract infection?: Yes Sexually transmitted disease?: No Injury to kidneys or bladder?: No Painful intercourse?: No Weak stream?: No Currently pregnant?: No Vaginal bleeding?: No Last menstrual period?: n  Gastrointestinal Nausea?: No Vomiting?: No Indigestion/heartburn?: No Diarrhea?: Yes Constipation?: No  Constitutional Fever: No Night sweats?: No Weight loss?: No Fatigue?: No  Skin Skin rash/lesions?: No Itching?: No  Eyes Blurred vision?: No Double vision?: No  Ears/Nose/Throat Sore throat?: No Sinus problems?: No  Hematologic/Lymphatic Swollen glands?: No Easy bruising?: No  Cardiovascular Leg  swelling?: Yes Chest pain?: No  Respiratory Cough?: No Shortness of breath?: No  Endocrine Excessive thirst?: No  Musculoskeletal Back pain?: Yes Joint pain?: No  Neurological Headaches?: No Dizziness?: No  Psychologic Depression?: Yes Anxiety?: Yes  Physical Exam: BP 127/87   Pulse 80   Ht _0  (1.702 m)   Wt 274 lb (124.3 kg)   LMP 02/04/2018 (Approximate) Comment: Per patient, over a year ago  BMI 42.91 kg/m   Constitutional:  Alert and oriented, no acute distress, nontoxic appearing HEENT: [place], AT Cardiovascular: No clubbing, cyanosis, or edema Respiratory: Normal respiratory effort, no increased work of breathing Skin: No rashes, bruises or suspicious lesions Neurologic: Grossly intact, no focal deficits, moving all 4 extremities Psychiatric: Normal mood and affect  Laboratory Data: Results for orders placed or performed in visit on 10/05/19  Microscopic Examination   URINE  Result Value Ref Range   WBC, UA >30 (A) 0 - 5 /hpf   RBC None seen 0 - 2 /hpf   Epithelial Cells (non renal) 0-10 0 - 10 /hpf   Bacteria, UA Few None seen/Few  Urinalysis, Complete  Result Value Ref Range   Specific Gravity, UA 1.020 1.005 - 1.030   pH, UA  7.0 5.0 - 7.5   Color, UA Yellow Yellow   Appearance Ur Cloudy (A) Clear   Leukocytes,UA 2+ (A) Negative   Protein,UA Negative Negative/Trace   Glucose, UA Negative Negative   Ketones, UA Negative Negative   RBC, UA Trace (A) Negative   Bilirubin, UA Negative Negative   Urobilinogen, Ur 0.2 0.2 - 1.0 mg/dL   Nitrite, UA Negative Negative   Microscopic Examination See below:    Assessment & Plan:   1. Recurrent UTI Patient reports a 1 week history of dysuria, urgency, and nocturnal enuresis with a history of recurrent UTI.  She has been treating at home with 3 days of Bactrim so far.  Urine culture may be negative at this point, however will obtain anyway.  UA significant for pyuria.  Will treat per culture results. - Urinalysis, Complete - CULTURE, URINE COMPREHENSIVE  2. Overactive bladder Symptoms improved on a trial of Myrbetriq 50 mg daily.  Will continue this.  3. Renal mass History still difficult to elucidate in the absence of further medical records.  We will continue to monitor for these and develop a plan moving forward.  Debroah Loop, PA-C  [facility] [address] [place], [place] [address] [phone]

## 2019-10-06 DIAGNOSIS — F3181 Bipolar II disorder: Secondary | ICD-10-CM | POA: Diagnosis not present

## 2019-10-07 ENCOUNTER — Telehealth: Payer: Self-pay | Admitting: Physician Assistant

## 2019-10-07 ENCOUNTER — Other Ambulatory Visit: Payer: Self-pay | Admitting: Physician Assistant

## 2019-10-07 DIAGNOSIS — N390 Urinary tract infection, site not specified: Secondary | ICD-10-CM

## 2019-10-07 MED ORDER — NITROFURANTOIN MONOHYD MACRO 100 MG PO CAPS: 100.0000 mg | ORAL_CAPSULE | Freq: Two times a day (BID) | ORAL | 0 refills | Status: AC

## 2019-10-07 NOTE — Telephone Encounter (Signed)
Patient's urine culture resulted with E. coli, susceptibilities pending.  Starting her on empiric Macrobid today.  I spoke with the patient via telephone to notify her.  I explained that I would contact her early next week if I need to switch her antibiotic per her culture results.  She expressed understanding.

## 2019-10-11 ENCOUNTER — Inpatient Hospital Stay: Payer: Medicaid Other | Admitting: Oncology

## 2019-10-11 ENCOUNTER — Inpatient Hospital Stay: Payer: Medicaid Other

## 2019-10-12 ENCOUNTER — Other Ambulatory Visit: Payer: Self-pay | Admitting: Physician Assistant

## 2019-10-12 ENCOUNTER — Telehealth: Payer: Self-pay | Admitting: Physician Assistant

## 2019-10-12 ENCOUNTER — Other Ambulatory Visit: Payer: Self-pay

## 2019-10-12 LAB — CULTURE, URINE COMPREHENSIVE

## 2019-10-12 MED ORDER — CIPROFLOXACIN HCL 250 MG PO TABS: 250.0000 mg | ORAL_TABLET | Freq: Two times a day (BID) | ORAL | 0 refills | Status: AC

## 2019-10-12 NOTE — Telephone Encounter (Signed)
I just spoke to the patient via telephone to report the results of her recent urine culture.  I explained that Macrobid only treat one of the 2 bacteria that she grew out.  I would like to switch her to Cipro 250 mg twice daily x3 days.  Counseled her to start this and discontinue Macrobid ASAP.  She expressed understanding.

## 2019-10-13 ENCOUNTER — Inpatient Hospital Stay: Payer: Medicaid Other | Attending: Oncology

## 2019-10-13 ENCOUNTER — Encounter: Payer: Self-pay | Admitting: Oncology

## 2019-10-13 ENCOUNTER — Other Ambulatory Visit: Payer: Self-pay

## 2019-10-13 DIAGNOSIS — Z944 Liver transplant status: Secondary | ICD-10-CM | POA: Insufficient documentation

## 2019-10-13 DIAGNOSIS — K754 Autoimmune hepatitis: Secondary | ICD-10-CM | POA: Diagnosis not present

## 2019-10-13 DIAGNOSIS — I129 Hypertensive chronic kidney disease with stage 1 through stage 4 chronic kidney disease, or unspecified chronic kidney disease: Secondary | ICD-10-CM | POA: Diagnosis not present

## 2019-10-13 DIAGNOSIS — Z9225 Personal history of immunosupression therapy: Secondary | ICD-10-CM | POA: Diagnosis not present

## 2019-10-13 DIAGNOSIS — Z95828 Presence of other vascular implants and grafts: Secondary | ICD-10-CM

## 2019-10-13 DIAGNOSIS — Z79899 Other long term (current) drug therapy: Secondary | ICD-10-CM | POA: Insufficient documentation

## 2019-10-13 DIAGNOSIS — C8580 Other specified types of non-Hodgkin lymphoma, unspecified site: Secondary | ICD-10-CM

## 2019-10-13 DIAGNOSIS — N183 Chronic kidney disease, stage 3 unspecified: Secondary | ICD-10-CM | POA: Insufficient documentation

## 2019-10-13 DIAGNOSIS — C884 Extranodal marginal zone B-cell lymphoma of mucosa-associated lymphoid tissue [MALT-lymphoma]: Secondary | ICD-10-CM | POA: Insufficient documentation

## 2019-10-13 DIAGNOSIS — D696 Thrombocytopenia, unspecified: Secondary | ICD-10-CM | POA: Diagnosis not present

## 2019-10-13 DIAGNOSIS — Z452 Encounter for adjustment and management of vascular access device: Secondary | ICD-10-CM | POA: Diagnosis not present

## 2019-10-13 DIAGNOSIS — Z9221 Personal history of antineoplastic chemotherapy: Secondary | ICD-10-CM | POA: Insufficient documentation

## 2019-10-13 LAB — CBC WITH DIFFERENTIAL/PLATELET
Abs Immature Granulocytes: 0.03 10*3/uL (ref 0.00–0.07)
Basophils Absolute: 0 10*3/uL (ref 0.0–0.1)
Basophils Relative: 1 %
Eosinophils Absolute: 0.6 10*3/uL — ABNORMAL HIGH (ref 0.0–0.5)
Eosinophils Relative: 11 %
HCT: 38.1 % (ref 36.0–46.0)
Hemoglobin: 12.1 g/dL (ref 12.0–15.0)
Immature Granulocytes: 1 %
Lymphocytes Relative: 27 %
Lymphs Abs: 1.3 10*3/uL (ref 0.7–4.0)
MCH: 28.4 pg (ref 26.0–34.0)
MCHC: 31.8 g/dL (ref 30.0–36.0)
MCV: 89.4 fL (ref 80.0–100.0)
Monocytes Absolute: 0.5 10*3/uL (ref 0.1–1.0)
Monocytes Relative: 11 %
Neutro Abs: 2.4 10*3/uL (ref 1.7–7.7)
Neutrophils Relative %: 49 %
Platelets: 138 10*3/uL — ABNORMAL LOW (ref 150–400)
RBC: 4.26 MIL/uL (ref 3.87–5.11)
RDW: 13.1 % (ref 11.5–15.5)
WBC: 4.9 10*3/uL (ref 4.0–10.5)
nRBC: 0 % (ref 0.0–0.2)

## 2019-10-13 LAB — COMPREHENSIVE METABOLIC PANEL
ALT: 27 U/L (ref 0–44)
AST: 24 U/L (ref 15–41)
Albumin: 3.6 g/dL (ref 3.5–5.0)
Alkaline Phosphatase: 60 U/L (ref 38–126)
Anion gap: 9 (ref 5–15)
BUN: 24 mg/dL — ABNORMAL HIGH (ref 6–20)
CO2: 24 mmol/L (ref 22–32)
Calcium: 9.2 mg/dL (ref 8.9–10.3)
Chloride: 105 mmol/L (ref 98–111)
Creatinine, Ser: 0.95 mg/dL (ref 0.44–1.00)
GFR calc Af Amer: >60 mL/min (ref >60)
GFR calc non Af Amer: >60 mL/min (ref >60)
Glucose, Bld: 173 mg/dL — ABNORMAL HIGH (ref 70–99)
Potassium: 4.1 mmol/L (ref 3.5–5.1)
Sodium: 138 mmol/L (ref 135–145)
Total Bilirubin: 0.5 mg/dL (ref 0.3–1.2)
Total Protein: 6.8 g/dL (ref 6.5–8.1)

## 2019-10-13 LAB — LACTATE DEHYDROGENASE: LDH: 138 U/L (ref 98–192)

## 2019-10-13 MED ORDER — SODIUM CHLORIDE 0.9% FLUSH
10.0000 mL | Freq: Once | INTRAVENOUS | Status: AC
  Administered 2019-10-13: 10 mL via INTRAVENOUS
  Filled 2019-10-13: qty 10

## 2019-10-13 MED ORDER — HEPARIN SOD (PORK) LOCK FLUSH 100 UNIT/ML IV SOLN
500.0000 [IU] | Freq: Once | INTRAVENOUS | Status: AC
  Administered 2019-10-13: 500 [IU] via INTRAVENOUS
  Filled 2019-10-13: qty 5

## 2019-10-13 NOTE — Progress Notes (Signed)
Patient stated that she is currently having an UTI and luckily started taking antibiotic. Patient stated that this is occurring with more frequency.

## 2019-10-14 ENCOUNTER — Inpatient Hospital Stay (HOSPITAL_BASED_OUTPATIENT_CLINIC_OR_DEPARTMENT_OTHER): Payer: Medicaid Other | Admitting: Oncology

## 2019-10-14 ENCOUNTER — Telehealth: Payer: Medicaid Other | Admitting: Oncology

## 2019-10-14 ENCOUNTER — Other Ambulatory Visit: Payer: Medicaid Other

## 2019-10-14 DIAGNOSIS — Z8572 Personal history of non-Hodgkin lymphomas: Secondary | ICD-10-CM | POA: Diagnosis not present

## 2019-10-14 DIAGNOSIS — Z08 Encounter for follow-up examination after completed treatment for malignant neoplasm: Secondary | ICD-10-CM | POA: Diagnosis not present

## 2019-10-15 ENCOUNTER — Encounter: Payer: Self-pay | Admitting: Psychiatry

## 2019-10-15 ENCOUNTER — Ambulatory Visit (INDEPENDENT_AMBULATORY_CARE_PROVIDER_SITE_OTHER): Payer: Medicaid Other | Admitting: Psychiatry

## 2019-10-15 ENCOUNTER — Other Ambulatory Visit: Payer: Self-pay

## 2019-10-15 DIAGNOSIS — F3130 Bipolar disorder, current episode depressed, mild or moderate severity, unspecified: Secondary | ICD-10-CM | POA: Diagnosis not present

## 2019-10-15 DIAGNOSIS — G2111 Neuroleptic induced parkinsonism: Secondary | ICD-10-CM | POA: Diagnosis not present

## 2019-10-15 DIAGNOSIS — F4310 Post-traumatic stress disorder, unspecified: Secondary | ICD-10-CM | POA: Diagnosis not present

## 2019-10-15 DIAGNOSIS — F3181 Bipolar II disorder: Secondary | ICD-10-CM | POA: Diagnosis not present

## 2019-10-15 MED ORDER — DULOXETINE HCL 20 MG PO CPEP: 40.0000 mg | ORAL_CAPSULE | Freq: Every day | ORAL | 1 refills | Status: DC

## 2019-10-15 MED ORDER — BENZTROPINE MESYLATE 0.5 MG PO TABS: 0.5000 mg | ORAL_TABLET | Freq: Every day | ORAL | 1 refills | Status: DC | PRN

## 2019-10-15 MED ORDER — HYDROXYZINE PAMOATE 25 MG PO CAPS: 25.0000 mg | ORAL_CAPSULE | Freq: Two times a day (BID) | ORAL | 1 refills | Status: DC | PRN

## 2019-10-15 MED ORDER — QUETIAPINE FUMARATE ER 150 MG PO TB24: 150.0000 mg | ORAL_TABLET | Freq: Every day | ORAL | 0 refills | Status: DC

## 2019-10-15 NOTE — Progress Notes (Signed)
Virtual Visit via Video Note  I connected with Alison [name] on 10/15/19 at  9:00 AM EST by a video enabled telemedicine application and verified that I am speaking with the correct person using two identifiers.   I discussed the limitations of evaluation and management by telemedicine and the availability of in person appointments. The patient expressed understanding and agreed to proceed.     I discussed the assessment and treatment plan with the patient. The patient was provided an opportunity to ask questions and all were answered. The patient agreed with the plan and demonstrated an understanding of the instructions.   The patient was advised to call back or seek an in-person evaluation if the symptoms worsen or if the condition fails to improve as anticipated.   [facility] MD OP Progress Note  10/15/2019 9:09 AM Alison [name]  MRN:  [id number]  Chief Complaint:  Chief Complaint    Follow-up     HPI: Alison [name] is a 44 year old female, Caucasian, lives in [place], originally from [place] was evaluated by telemedicine today.  Patient has a history of bipolar disorder, PTSD, history of borderline personality disorder, chronic pain from fibromyalgia, trigeminal neuralgia, history of liver transplant, B-cell lymphoma, SLE, diabetes melitis, hypertension, OSA, migraine headaches.  Patient today reports she is currently making progress with regards to her depressive symptoms.  She denies any significant mania or hypomanic symptoms.  She reports sleep is improving.  She reports last night she slept well.  She reports she currently does not have any suicidality, homicidality or perceptual disturbances.  She however continues to struggle with lower extremity swelling.  She reports she had several visits with her providers however the swelling is not improving.  She is going to pick up a lymphedema pump to see if that will help.  She also had recent UTI and initially started Macrobid which did  not help.  She reports she is about to pick up Cipro since her providers recommended she begin that medication.  Patient reports she continues to be in psychotherapy sessions with her therapist Ebony Hail and it is going well.  She reports she continues to work on her genealogy hobby and was recently able to find a fourth cousin who is in [place].  She reports they have been talking and and that has been helpful.  She reports her birthday is coming up in few days and she is planning to have a virtual birthday party.  Patient denies any other concerns today.    Visit Diagnosis:    ICD-10-CM   1. Bipolar I disorder, most recent episode depressed (HCC)  F31.30 DULoxetine (CYMBALTA) 20 MG capsule    QUEtiapine Fumarate (SEROQUEL XR) 150 MG 24 hr tablet  2. PTSD (post-traumatic stress disorder)  F43.10 DULoxetine (CYMBALTA) 20 MG capsule    hydrOXYzine (VISTARIL) 25 MG capsule  3. 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 Abnormal uterine bleeding   . Allergy   . Anxiety   . Arthritis   . Bipolar disorder (manic depression) ([place])   . Chronic kidney failure   . Chronic renal disease, stage III   . Diabetes mellitus without complication ([place])   . 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  . FH: trigeminal neuralgia   .  GERD (gastroesophageal reflux disease)   . Heart murmur   . Hypertension   . Kidney mass   . Lupus ([place])   . Lupus ([place])   . Major depressive disorder   . 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 Migraine   . Morbid obesity ([place])   . Neuromuscular disorder (HCC)    neuropathy  . Neuropathy   . Personality disorder ([place])   . Post herpetic neuralgia   . PTSD (post-traumatic  stress disorder)   . Renal disorder   . S/P liver transplant [facility])     Past Surgical History:  Procedure Laterality Date  . BONE MARROW BIOPSY  01/13/2015  . BREAST BIOPSY  12/2014  . BREAST BIOPSY  2011  . BREAST SURGERY    . CHOLECYSTECTOMY    . HERNIA REPAIR    . infusaport    . LIVER TRANSPLANT  12/17/1991  . LUMBAR PUNCTURE    . PORT A CATH INJECTION ([place] HX)    . tumor removal  2015    Family Psychiatric History: I have reviewed family psychiatric history from my progress note on 06/10/2019  Family History:  Family History  Problem Relation Age of Onset  . Heart disease Mother   . Hypertension Mother   . Cancer - Other Mother   . Bipolar disorder Mother   . Heart disease Father   . Hypertension Father   . Diabetes Father   . Parkinson's disease Maternal Grandmother   . Cancer Maternal Aunt   . Cancer Maternal Uncle   . Cancer Maternal Grandfather   . Lupus Paternal Grandmother   . Hypertension Brother     Social History: I have reviewed social history from my progress note from 06/10/2019 Social History   Socioeconomic History  . Marital status: Single    Spouse name: Not on file  . Number of children: 0  . Years of education: 9  . Highest education level: GED or equivalent  Occupational History  . Occupation: disabled  Tobacco Use  . Smoking status: Never Smoker  . Smokeless tobacco: Never Used  Substance and Sexual Activity  . Alcohol use: Not Currently  . Drug use: Not Currently    Types: Marijuana    Comment: pain managment last used in early april  . Sexual activity: Not Currently  Other Topics Concern  . Not on file  Social History Narrative  . Not on file   Social Determinants of Health   Financial Resource Strain: Medium Risk  . Difficulty of Paying Living Expenses: Somewhat hard  Food Insecurity: Food Insecurity Present  . Worried About Charity fundraiser in the Last Year: Sometimes true  . Ran Out of Food in the Last Year: Sometimes  true  Transportation Needs: Unmet Transportation Needs  . Lack of Transportation (Medical): Yes  . Lack of Transportation (Non-Medical): Yes  Physical Activity: Insufficiently Active  . Days of Exercise per Week: 1 day  . Minutes of Exercise per Session: 30 min  Stress: Stress Concern Present  . Feeling of Stress : Rather much  Social Connections: Somewhat Isolated  . Frequency of Communication with Friends and Family: Once a week  . Frequency of Social Gatherings with Friends and Family: Three times a week  . Attends Religious Services: More than 4 times per year  . Active Member of Clubs or Organizations: No  . Attends Archivist Meetings: Never  . Marital Status: Never married  Allergies:  Allergies  Allergen Reactions  . Penicillin G Itching, Rash, Anaphylaxis and Palpitations  . Carbamazepine Other (See Comments)    Medication interaction-prograf  . Hydrocodone-Acetaminophen Itching  . Naproxen Itching    Metabolic Disorder Labs: Lab Results  Component Value Date   HGBA1C 6.6 (H) 07/15/2019   Lab Results  Component Value Date   PROLACTIN 13.4 07/15/2019   PROLACTIN 21.3 05/24/2019   No results found for: CHOL, TRIG, HDL, CHOLHDL, VLDL, LDLCALC Lab Results  Component Value Date   TSH 2.650 07/15/2019   TSH 4.650 (H) 05/24/2019    Therapeutic Level Labs: No results found for: LITHIUM No results found for: VALPROATE No components found for:  CBMZ  Current Medications: Current Outpatient Medications  Medication Sig Dispense Refill  . baclofen (LIORESAL) 20 MG tablet Take 1 tablet (20 mg total) by mouth 3 (three) times daily as needed for muscle spasms. 90 each 2  . benztropine (COGENTIN) 0.5 MG tablet Take 1 tablet (0.5 mg total) by mouth daily as needed for tremors. 30 tablet 1  . ciprofloxacin (CIPRO) 250 MG tablet Take 1 tablet (250 mg total) by mouth 2 (two) times daily for 3 days. 6 tablet 0  . Cranberry 500 MG TABS Take by mouth.    .  DULoxetine (CYMBALTA) 20 MG capsule Take 2 capsules (40 mg total) by mouth daily. 60 capsule 1  . hydrOXYzine (VISTARIL) 25 MG capsule Take 1 capsule (25 mg total) by mouth 2 (two) times daily as needed. For sever anxiety attacks 60 capsule 1  . lisinopril (ZESTRIL) 20 MG tablet Take 1 tablet (20 mg total) by mouth 1 day or 1 dose. 90 tablet 1  . Magnesium Bisglycinate (MAG GLYCINATE) 100 MG TABS Take by mouth.    . Melatonin 5 MG CAPS Take 1-2 capsules (5-10 mg total) by mouth at bedtime. 100 capsule 0  . metFORMIN (GLUCOPHAGE) 1000 MG tablet Take 1 tablet by mouth 2 (two) times a day.    . mirabegron ER (MYRBETRIQ) 50 MG TB24 tablet Take 1 tablet (50 mg total) by mouth daily. 30 tablet 6  . mycophenolate (CELLCEPT) 250 MG capsule Take 1 capsule by mouth 2 (two) times a day.    . oxyCODONE (OXYCONTIN) 10 mg 12 hr tablet Take by mouth.    . pregabalin (LYRICA) 200 MG capsule Take 1 capsule (200 mg total) by mouth 3 (three) times daily. 90 capsule 2  . promethazine (PHENERGAN) 25 MG tablet Take 1 tablet by mouth as needed.    [name] QUEtiapine Fumarate (SEROQUEL XR) 150 MG 24 hr tablet Take 1 tablet (150 mg total) by mouth at bedtime. 90 tablet 0  . Specialty Vitamins Products (MAGNESIUM, AMINO ACID CHELATE,) 133 MG tablet Take 1 tablet by mouth 2 (two) times daily.    . SUMAtriptan (IMITREX) 100 MG tablet TAKE 1 TABLET BY MOUTH ONCE AS NEEDED FOR MIGRAINE. MAY TAKE A SECOND DOSE AFTER 2 HOURS IF NEEDED    . tacrolimus (PROGRAF) 1 MG capsule Take 1 capsule by mouth once daily 30 capsule 0   No current facility-administered medications for this visit.     Musculoskeletal: Strength & Muscle Tone: UTA Gait & Station: normal Patient leans: N/A  Psychiatric Specialty Exam: Review of Systems  Cardiovascular: Positive for leg swelling.       Left sided  Genitourinary: Positive for frequency.  Psychiatric/Behavioral: The patient is nervous/anxious.   All other systems reviewed and are negative.    Last menstrual period 02/04/2018.There  is no height or weight on file to calculate BMI.  General Appearance: Casual  Eye Contact:  Fair  Speech:  Clear and Coherent  Volume:  Normal  Mood:  Anxious improving  Affect:  Congruent  Thought Process:  Goal Directed and Descriptions of Associations: Intact  Orientation:  Full (Time, Place, and Person)  Thought Content: Logical   Suicidal Thoughts:  No  Homicidal Thoughts:  No  Memory:  Immediate;   Fair Recent;   Fair Remote;   Fair  Judgement:  Fair  Insight:  Fair  Psychomotor Activity:  Normal  Concentration:  Concentration: Fair and Attention Span: Fair  Recall:  [employer] of Knowledge: Fair  Language: Fair  Akathisia:  No  Handed:  Right  AIMS (if indicated): denies tremors, rigidity  Assets:  Communication Skills Desire for Improvement Housing Social Support  ADL's:  Intact  Cognition: WNL  Sleep:  Restless   Screenings: PHQ2-9     Nutrition from 09/23/2019 in [place] Office Visit from 09/16/2019 in [facility] from 08/10/2019 in [facility] Office Visit from 07/30/2019 in [facility] Office Visit from 06/17/2019 in [facility]  PHQ-2 Total Score  _0 0  4  PHQ-9 Total Score  _1 0  16       Assessment and Plan: Eriyana is a 44 year old Caucasian female, engaged, on disability, has a history of bipolar disorder, PTSD and multiple medical problems was evaluated by telemedicine today.  She is biologically predisposed given her history of trauma, family history as well as multiple health issues.  Patient with psychosocial stressors of being separated from her father, her own pain and other health issues.  Patient is currently making progress on the current medication regimen.  Plan as noted below.  Plan Bipolar disorder-improving Seroquel XR 150 mg p.o. nightly Cogentin 0.5 mg p.o.  daily as needed Increase Cymbalta to 40 mg p.o. daily  PTSD-improving Increase Cymbalta to 40 mg p.o. daily Hydroxyzine 25 mg p.o. twice daily as needed for anxiety attacks Melatonin 5 to 10 mg p.o. nightly.  Patient has frequent urination which does affect her sleep.  She will continue to work on sleep hygiene techniques Patient to continue psychotherapy sessions with [facility] at [facility] at [phone]  Neuroleptic induced Parkinson's disease-improving Cogentin 0.5 mg p.o. daily as needed  I have reviewed medical records in [facility] from her primary care provider-PA Vaillancourt -dated 10/05/2019-' patient was seen by Dr. Vista Lawman approximately 6 weeks ago to establish care for several neurologic problems including suspicious renal mass status post multiple biopsies, recurrent UTI, urinary frequency and urge incontinence.  In office today-positive for trace-intact blood and 2+ leukocyte esterase, urine microscopy with more than 30 WBCs/HPF-4 recurrent UTI-get cultures For overactive bladder-continue Myrbetriq 50 mg daily Renal mass-history still difficult to elucidate in the absence of other medical records.'    Follow-up in clinic in 1 month or sooner if needed.  February 5 at 9 AM  I have spent atleast 25 minutes non face to face with patient today. More than 50 % of the time was spent for ordering medications and test ,psychoeducation and supportive psychotherapy and care coordination,as well as documenting clinical information in electronic health record. This note was generated in part or whole with voice recognition software. Voice recognition is usually quite accurate but there are transcription errors that can and  very often do occur. I apologize for any typographical errors that were not detected and corrected.       [name], MD 10/15/2019, 9:09 AM

## 2019-10-16 NOTE — Progress Notes (Signed)
I connected with Alison [name] on 10/16/19 at 10:45 AM EST by video enabled telemedicine visit and verified that I am speaking with the correct person using two identifiers.   I discussed the limitations, risks, security and privacy concerns of performing an evaluation and management service by telemedicine and the availability of in-person appointments. I also discussed with the patient that there may be a patient responsible charge related to this service. The patient expressed understanding and agreed to proceed.  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 She is currently on CellCept and tacrolimus for the same. She also has a history of post transplant lymphoproliferative disorder/DLBCL that was diagnosed in 2016. She is s/p 6 cycles of R-CHOP chemotherapy back then and was in complete remission 1. Her other past medical history significant for migraines, stage III chronic kidney disease, chemo-induced peripheral neuropathy, hypertension among other medical problems. With regards to her diffuse large B-cell lymphoma she was getting surveillance scans and this was all done in [place]. She has now moved to [facility] to be with her significant other.Patient was noted to have a prior kidney mass before which was biopsied and was not consistent with malignancy.  She underwent CT neck with contrast before she left [place] on 02/22/2019. She was noted to have soft tissue masses in both the parotid glands which were new as compared to prior exams and possibly  represent lymph nodes. The largest one was seen in the left parotid gland measuring 1.3 x 1.1 cm. Before she could get a work-up for this patient moved to [place] and has not had any further work-up yet  Her other prior imaging is as follows: MRI thoracic spine without contrast in August 2019 showed moderate multilevel spondylosis but no evidence of lymphoma. Multinodular thyroid in June 2019. MRI cervical spine May 2019 again showed spondylosis but no other acute pathology. MRI brain showed incidental partial opacification of the right and right sinus. No evidence of malignancy. I do not have any other PET CT scan or treatment records from the past.  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 Bone marrow biopsywas negative for lymphoma   Interval history reports that her weight and appetite are stable.  Denies any lumps or fungi.  Denies any night sweats or pain   Review of Systems  Constitutional: Negative for chills, fever, malaise/fatigue and weight loss.  HENT: Negative for congestion, ear discharge and nosebleeds.   Eyes: Negative for blurred vision.  Respiratory: Negative for cough, hemoptysis, sputum production, shortness of breath and wheezing.   Cardiovascular: Negative for chest pain, palpitations, orthopnea and claudication.  Gastrointestinal: Negative for abdominal pain, blood in stool, constipation, diarrhea, heartburn, melena, nausea and vomiting.  Genitourinary: Negative for dysuria, flank pain, frequency, hematuria and urgency.  Musculoskeletal: Negative for back pain, joint pain and myalgias.  Skin: Negative for rash.  Neurological: Negative for dizziness, tingling, focal weakness, seizures, weakness and headaches.  Endo/Heme/Allergies: Does not bruise/bleed easily.  Psychiatric/Behavioral: Negative for depression and suicidal ideas. The patient does not have insomnia.     Allergies  Allergen Reactions  .  Penicillin G Itching, Rash, Anaphylaxis and Palpitations  . Carbamazepine Other (See Comments)    Medication interaction-prograf  . Hydrocodone-Acetaminophen Itching  . Naproxen Itching    Past Medical History:  Diagnosis Date  . Abnormal uterine bleeding   . Allergy   . Anxiety   . Arthritis   . Bipolar disorder (manic depression) ([place])   . Chronic kidney failure   . Chronic renal disease, stage III   . Diabetes mellitus without complication ([place])   . 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[place]) 2015   Right axillary lymph node resected and chemo tx's.  . FH: trigeminal neuralgia   . GERD (gastroesophageal reflux disease)   . Heart murmur   . Hypertension   . Kidney mass   . Lupus ([place])   . Lupus ([place])   . Major depressive disorder   . 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[place]) 06/2019   Chemo tx's  . Migraine   . Morbid obesity ([place])   . Neuromuscular disorder ([facility])    neuropathy  . Neuropathy   . Personality disorder ([place])   . Post herpetic neuralgia   . PTSD (post-traumatic stress disorder)   . Renal disorder   . S/P liver transplant [facility])     Past Surgical History:  Procedure Laterality Date  . BONE MARROW BIOPSY  01/13/2015  . BREAST BIOPSY  12/2014  . BREAST BIOPSY  2011  . BREAST SURGERY    . CHOLECYSTECTOMY    . HERNIA REPAIR    . infusaport    . LIVER TRANSPLANT  12/17/1991  . LUMBAR PUNCTURE    . PORT A CATH INJECTION ([place] [facility])    . tumor removal  2015    Social History   Socioeconomic History  . Marital status: Single    Spouse name: Not on file  . Number of children: 0  . Years of education: 9  . Highest education level: GED or equivalent  Occupational History  . Occupation: disabled  Tobacco Use  . Smoking status: Never Smoker  . Smokeless tobacco: Never Used  Substance and Sexual Activity  . Alcohol use: Not Currently  . Drug use: Not Currently    Types: Marijuana    Comment: pain managment last used in early april  . Sexual activity:  Not Currently  Other Topics Concern  . Not on file  Social History Narrative  . Not on file   Social Determinants of Health   Financial Resource Strain: Medium Risk  . Difficulty of Paying Living Expenses: Somewhat hard  Food Insecurity: Food Insecurity Present  . Worried About Charity fundraiser in the Last Year: Sometimes true  . Ran Out of Food in the Last Year: Sometimes true  Transportation Needs: Unmet Transportation Needs  . Lack of Transportation (Medical): Yes  . Lack of Transportation (Non-Medical): Yes  Physical Activity: Insufficiently Active  . Days of Exercise per Week: 1 day  . Minutes of Exercise per Session: 30 min  Stress: Stress Concern Present  . Feeling of Stress : Rather much  Social Connections: Somewhat Isolated  . Frequency of Communication with Friends and Family: Once a week  . Frequency of Social Gatherings with Friends and Family: Three times a week  . Attends Religious Services: More than 4 times per year  . Active Member of Clubs or Organizations: No  . Attends Archivist Meetings: Never  . Marital Status: Never married  Intimate Partner Violence: Not At Risk  . Fear of Current  or Ex-Partner: No  . Emotionally Abused: No  . Physically Abused: No  . Sexually Abused: No    Family History  Problem Relation Age of Onset  . Heart disease Mother   . Hypertension Mother   . Cancer - Other Mother   . Bipolar disorder Mother   . Heart disease Father   . Hypertension Father   . Diabetes Father   . Parkinson's disease Maternal Grandmother   . Cancer Maternal Aunt   . Cancer Maternal Uncle   . Cancer Maternal Grandfather   . Lupus Paternal Grandmother   . Hypertension Brother      Current Outpatient Medications:  .  baclofen (LIORESAL) 20 MG tablet, Take 1 tablet (20 mg total) by mouth 3 (three) times daily as needed for muscle spasms., Disp: 90 each, Rfl: 2 .  Cranberry 500 MG TABS, Take by mouth., Disp: , Rfl:  .  lisinopril  (ZESTRIL) 20 MG tablet, Take 1 tablet (20 mg total) by mouth 1 day or 1 dose., Disp: 90 tablet, Rfl: 1 .  Magnesium Bisglycinate (MAG GLYCINATE) 100 MG TABS, Take by mouth., Disp: , Rfl:  .  Melatonin 5 MG CAPS, Take 1-2 capsules (5-10 mg total) by mouth at bedtime., Disp: 100 capsule, Rfl: 0 .  metFORMIN (GLUCOPHAGE) 1000 MG tablet, Take 1 tablet by mouth 2 (two) times a day., Disp: , Rfl:  .  mirabegron ER (MYRBETRIQ) 50 MG TB24 tablet, Take 1 tablet (50 mg total) by mouth daily., Disp: 30 tablet, Rfl: 6 .  mycophenolate (CELLCEPT) 250 MG capsule, Take 1 capsule by mouth 2 (two) times a day., Disp: , Rfl:  .  oxyCODONE (OXYCONTIN) 10 mg 12 hr tablet, Take by mouth., Disp: , Rfl:  .  promethazine (PHENERGAN) 25 MG tablet, Take 1 tablet by mouth as needed., Disp: , Rfl:  .  Specialty Vitamins Products (MAGNESIUM, AMINO ACID CHELATE,) 133 MG tablet, Take 1 tablet by mouth 2 (two) times daily., Disp: , Rfl:  .  SUMAtriptan (IMITREX) 100 MG tablet, TAKE 1 TABLET BY MOUTH ONCE AS NEEDED FOR MIGRAINE. MAY TAKE A SECOND DOSE AFTER 2 HOURS IF NEEDED, Disp: , Rfl:  .  tacrolimus (PROGRAF) 1 MG capsule, Take 1 capsule by mouth once daily, Disp: 30 capsule, Rfl: 0 .  benztropine (COGENTIN) 0.5 MG tablet, Take 1 tablet (0.5 mg total) by mouth daily as needed for tremors., Disp: 30 tablet, Rfl: 1 .  DULoxetine (CYMBALTA) 20 MG capsule, Take 2 capsules (40 mg total) by mouth daily., Disp: 60 capsule, Rfl: 1 .  hydrOXYzine (VISTARIL) 25 MG capsule, Take 1 capsule (25 mg total) by mouth 2 (two) times daily as needed. For sever anxiety attacks, Disp: 60 capsule, Rfl: 1 .  pregabalin (LYRICA) 200 MG capsule, Take 1 capsule (200 mg total) by mouth 3 (three) times daily., Disp: 90 capsule, Rfl: 2 .  QUEtiapine Fumarate (SEROQUEL XR) 150 MG 24 hr tablet, Take 1 tablet (150 mg total) by mouth at bedtime., Disp: 90 tablet, Rfl: 0  CT Pelvis W Contrast  Result Date: 09/29/2019 CLINICAL DATA:  LEFT lower extremity  swelling with LEFT pain from buttock area 10 knee for 1 week. LEFT pelvic tightness when walking. History of diffuse large B-cell lymphoma in 2015 status post chemotherapy. EXAM: CT PELVIS WITH CONTRAST TECHNIQUE: Multidetector CT imaging of the pelvis was performed using the standard protocol following the bolus administration of intravenous contrast. CONTRAST:  136m OMNIPAQUE IOHEXOL 300 MG/ML  SOLN COMPARISON:  None. FINDINGS:  Urinary Tract: Bladder appears normal, partially decompressed. Lower ureters are normal in caliber. Bowel: No dilated large or small bowel loops are seen within the pelvis or lower abdomen. No evidence of bowel wall inflammation. Vascular/Lymphatic: Vascular structures of the pelvis appear normal. No enlarged lymph nodes appreciated within the pelvis. Reproductive: Heterogeneity of the uterine fundus, suspected fibroid. No adnexal mass or free fluid identified. Other: No free fluid or abscess collection identified. No free intraperitoneal air. Surgical changes suggestive of hernia repair in the RIGHT lower abdomen. Musculoskeletal: Superficial soft tissues of the pelvis and upper thighs are unremarkable. No soft tissue mass, fluid collection or evidence of soft tissue edema. No acute or suspicious osseous finding. No significant degenerative change at either hip joint. IMPRESSION: 1. No acute findings. Pelvic portion of the bowel appears normal. No free fluid or inflammatory change within the pelvis. No soft tissue mass, fluid collection or evidence of soft tissue edema. Vascular structures of the pelvis are unremarkable. No acute or suspicious osseous abnormality. 2. Heterogeneity of the uterine fundus, suspected fibroid. Consider further characterization with nonemergent pelvic ultrasound. Electronically Signed   By: Franki Cabot M.D.   On: 09/29/2019 11:18   DG FEMUR MIN 2 VIEWS LEFT  Result Date: 09/24/2019 CLINICAL DATA:  Left thigh pain for 1 week. No reported acute injury.  EXAM: LEFT FEMUR 2 VIEWS COMPARISON:  None. FINDINGS: The mineralization and alignment are normal. There is no evidence of acute fracture or dislocation. The joint spaces are maintained at the left hip and knee. No evidence of femoral head avascular necrosis or significant knee joint effusion. IMPRESSION: Normal radiographs of the left thigh. Electronically Signed   By: Richardean Sale M.D.   On: 09/24/2019 19:46   VAS [place] LOWER EXTREMITY VENOUS REFLUX  Result Date: 09/28/2019  Lower Venous Reflux Study Indications: Swelling, and left LE.  Performing Technologist: Concha [place] RVT  Examination Guidelines: A complete evaluation includes B-mode imaging, spectral Doppler, color Doppler, and power Doppler as needed of all accessible portions of each vessel. Bilateral testing is considered an integral part of a complete examination. Limited examinations for reoccurring indications may be performed as noted. The reflux portion of the exam is performed with the patient in reverse Trendelenburg.  +------+---------------+---------+-----------+----------+--------------+ LEFT  CompressibilityPhasicitySpontaneityPropertiesThrombus Aging +------+---------------+---------+-----------+----------+--------------+ CFV   Full           Yes      Yes                                 +------+---------------+---------+-----------+----------+--------------+ SFJ   Full           Yes      Yes                                 +------+---------------+---------+-----------+----------+--------------+ FV MidFull           Yes      Yes                                 +------+---------------+---------+-----------+----------+--------------+ POP   Full           Yes      Yes                                 +------+---------------+---------+-----------+----------+--------------+  GSV   Full                                                         +------+---------------+---------+-----------+----------+--------------+ SSV   Full                                                        +------+---------------+---------+-----------+----------+--------------+     Summary: Left: There is no evidence of deep vein thrombosis in the lower extremity. However, portions of this examination were limited- see technologist comments above. There is no evidence of chronic venous insufficiency.There is no evidence of superficial venous thrombosis.  *See table(s) above for measurements and observations. Electronically signed by Leotis Pain MD on 09/28/2019 at 8:44:15 AM.    Final     No images are attached to the encounter.   CMP Latest Ref Rng & Units 10/13/2019  Glucose 70 - 99 mg/dL 173(H)  BUN 6 - 20 mg/dL 24(H)  Creatinine 0.44 - 1.00 mg/dL 0.95  Sodium 135 - 145 mmol/L 138  Potassium 3.5 - 5.1 mmol/L 4.1  Chloride 98 - 111 mmol/L 105  CO2 22 - 32 mmol/L 24  Calcium 8.9 - 10.3 mg/dL 9.2  Total Protein 6.5 - 8.1 g/dL 6.8  Total Bilirubin 0.3 - 1.2 mg/dL 0.5  Alkaline Phos 38 - 126 U/L 60  AST 15 - 41 U/L 24  ALT 0 - 44 U/L 27   CBC Latest Ref Rng & Units 10/13/2019  WBC 4.0 - 10.5 K/uL 4.9  Hemoglobin 12.0 - 15.0 g/dL 12.1  Hematocrit 36.0 - 46.0 % 38.1  Platelets 150 - 400 K/uL 138(L)     Observation/objective: Appears in no acute distress on video visit today.  Breathing is nonlabored  Assessment and plan:Patient is a 44 y.o. 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 She is s/p 4 weekly cycles of Rituxan chemotherapy and this is a routine follow-up visit  Clinically patient is doing well and no concerning symptoms of recurrence based on today's visit.  I have personally reviewed her labs including CBC with differential, CMP and LDH which are unremarkable.  She does have mild thrombocytopenia with intermittent and we will continue to monitor that. I will see her back in 3 months time for  an visit for a comprehensive exam  Follow-up instructions: as above  I discussed the assessment and treatment plan with the patient. The patient was provided an opportunity to ask questions and all were answered. The patient agreed with the plan and demonstrated an understanding of the instructions.   The patient was advised to call back or seek an in-person evaluation if the symptoms worsen or if the condition fails to improve as anticipated.   Visit Diagnosis: 1. Encounter for follow-up surveillance of lymphoma     Dr. Randa Evens, MD, MPH [facility] [phone] 10/16/2019 4:40 PM

## 2019-10-26 ENCOUNTER — Ambulatory Visit: Payer: Medicaid Other | Attending: Internal Medicine

## 2019-10-26 DIAGNOSIS — Z20822 Contact with and (suspected) exposure to covid-19: Secondary | ICD-10-CM | POA: Diagnosis not present

## 2019-10-27 LAB — NOVEL CORONAVIRUS, NAA: SARS-CoV-2, NAA: NOT DETECTED

## 2019-10-28 ENCOUNTER — Ambulatory Visit: Payer: Medicaid Other | Admitting: Gastroenterology

## 2019-10-28 ENCOUNTER — Other Ambulatory Visit: Payer: Self-pay | Admitting: Family Medicine

## 2019-10-28 ENCOUNTER — Ambulatory Visit: Payer: Medicaid Other | Admitting: Dietician

## 2019-10-28 DIAGNOSIS — E1165 Type 2 diabetes mellitus with hyperglycemia: Secondary | ICD-10-CM

## 2019-10-28 DIAGNOSIS — I10 Essential (primary) hypertension: Secondary | ICD-10-CM

## 2019-10-29 DIAGNOSIS — F3181 Bipolar II disorder: Secondary | ICD-10-CM | POA: Diagnosis not present

## 2019-11-01 DIAGNOSIS — G894 Chronic pain syndrome: Secondary | ICD-10-CM | POA: Diagnosis not present

## 2019-11-01 DIAGNOSIS — F320 Major depressive disorder, single episode, mild: Secondary | ICD-10-CM | POA: Diagnosis not present

## 2019-11-04 DIAGNOSIS — F3181 Bipolar II disorder: Secondary | ICD-10-CM | POA: Diagnosis not present

## 2019-11-10 DIAGNOSIS — I890 Lymphedema, not elsewhere classified: Secondary | ICD-10-CM | POA: Diagnosis not present

## 2019-11-12 ENCOUNTER — Ambulatory Visit (INDEPENDENT_AMBULATORY_CARE_PROVIDER_SITE_OTHER): Payer: Medicaid Other | Admitting: Psychiatry

## 2019-11-12 ENCOUNTER — Encounter: Payer: Self-pay | Admitting: Psychiatry

## 2019-11-12 ENCOUNTER — Other Ambulatory Visit: Payer: Self-pay

## 2019-11-12 DIAGNOSIS — G2111 Neuroleptic induced parkinsonism: Secondary | ICD-10-CM | POA: Insufficient documentation

## 2019-11-12 DIAGNOSIS — F4310 Post-traumatic stress disorder, unspecified: Secondary | ICD-10-CM

## 2019-11-12 DIAGNOSIS — F3130 Bipolar disorder, current episode depressed, mild or moderate severity, unspecified: Secondary | ICD-10-CM | POA: Diagnosis not present

## 2019-11-12 DIAGNOSIS — T43505A Adverse effect of unspecified antipsychotics and neuroleptics, initial encounter: Secondary | ICD-10-CM | POA: Insufficient documentation

## 2019-11-12 MED ORDER — DULOXETINE HCL 60 MG PO CPEP: 60.0000 mg | ORAL_CAPSULE | Freq: Every day | ORAL | 0 refills | Status: DC

## 2019-11-12 MED ORDER — QUETIAPINE FUMARATE ER 50 MG PO TB24: 50.0000 mg | ORAL_TABLET | Freq: Every day | ORAL | 0 refills | Status: DC

## 2019-11-12 NOTE — Progress Notes (Signed)
Provider Location : [facility] Patient Location : Home  Virtual Visit via Video Note  I connected with Alison [name] on 11/12/19 at  9:00 AM EST by a video enabled telemedicine application and verified that I am speaking with the correct person using two identifiers.   I discussed the limitations of evaluation and management by telemedicine and the availability of in person appointments. The patient expressed understanding and agreed to proceed.     I discussed the assessment and treatment plan with the patient. The patient was provided an opportunity to ask questions and all were answered. The patient agreed with the plan and demonstrated an understanding of the instructions.   The patient was advised to call back or seek an in-person evaluation if the symptoms worsen or if the condition fails to improve as anticipated.   BH MD/PA/NP OP Progress Note  11/12/2019 9:11 AM Al Gagen  MRN:  [id number]  Chief Complaint:  Chief Complaint    Follow-up     HPI: Alison [name] is a 44 year old female, Caucasian, lives in [place], originally from [place] was evaluated by telemedicine today.  Patient has a history of bipolar disorder, PTSD, history of borderline personality disorder, chronic pain from fibromyalgia, trigeminal neuralgia, history of liver transplant, B-cell lymphoma, SLE, diabetes melitis, hypertension, OSA, migraine headaches.    Patient today reports she is currently struggling with a lot of pain since she ran out of her pain medications.  She reports her pharmacy did not have the pain medication available and they have to order it.  So she is waiting.  She does have mild withdrawal symptoms.  Discussed with patient to go to the nearest emergency department if she is having withdrawal symptoms that she cannot manage.  Patient reports she is currently struggling with depressive symptoms.  She reports it was the death anniversary of her mother on January 27.  It is been 3 years  since her mother passed away from cancer.  Patient reports she continues to miss her mother a lot and became tearful when she discussed her.  Patient reports she struggles with sadness, crying spells.  She also reports she has sleep problems.  She has been compliant on her medications as prescribed.  Patient does report mild shakiness of her extremities due to the Seroquel on and off.  She however reports she takes Cogentin as needed which helps.  It does not distress her much.  She denies any suicidality, homicidality or perceptual disturbances.   Visit Diagnosis:    ICD-10-CM   1. Bipolar I disorder, most recent episode depressed (HCC)  F31.30 DULoxetine (CYMBALTA) 60 MG capsule    QUEtiapine (SEROQUEL XR) 50 MG TB24 24 hr tablet  2. PTSD (post-traumatic stress disorder)  F43.10 DULoxetine (CYMBALTA) 60 MG capsule  3. 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  Past Medical History:  Past Medical History:  Diagnosis Date  . Abnormal uterine bleeding   . Allergy   . Anxiety   . Arthritis   . Bipolar disorder (manic depression) ([place])   . Chronic kidney failure   . Chronic renal disease, stage III   . Diabetes mellitus without complication ([place])   . 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  . FH: trigeminal neuralgia   . GERD (gastroesophageal reflux disease)   . Heart murmur   . Hypertension   .  Kidney mass   . Lupus ([place])   . Lupus ([place])   . Major depressive disorder   . 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 Migraine   . Morbid obesity ([place])   . Neuromuscular disorder (HCC)    neuropathy  . Neuropathy   . Personality disorder ([place])   . Post herpetic neuralgia   . PTSD (post-traumatic stress disorder)   . Renal disorder   . S/P liver transplant [facility])     Past Surgical  History:  Procedure Laterality Date  . BONE MARROW BIOPSY  01/13/2015  . BREAST BIOPSY  12/2014  . BREAST BIOPSY  2011  . BREAST SURGERY    . CHOLECYSTECTOMY    . HERNIA REPAIR    . infusaport    . LIVER TRANSPLANT  12/17/1991  . LUMBAR PUNCTURE    . PORT A CATH INJECTION ([place] [facility])    . tumor removal  2015    Family Psychiatric History: I have reviewed family psychiatric history from my progress note on 06/10/2019.  Family History:  Family History  Problem Relation Age of Onset  . Heart disease Mother   . Hypertension Mother   . Cancer - Other Mother   . Bipolar disorder Mother   . Heart disease Father   . Hypertension Father   . Diabetes Father   . Parkinson's disease Maternal Grandmother   . Cancer Maternal Aunt   . Cancer Maternal Uncle   . Cancer Maternal Grandfather   . Lupus Paternal Grandmother   . Hypertension Brother     Social History: I have reviewed social history from progress note from 06/10/2019. Social History   Socioeconomic History  . Marital status: Single    Spouse name: Not on file  . Number of children: 0  . Years of education: 9  . Highest education level: GED or equivalent  Occupational History  . Occupation: disabled  Tobacco Use  . Smoking status: Never Smoker  . Smokeless tobacco: Never Used  Substance and Sexual Activity  . Alcohol use: Not Currently  . Drug use: Not Currently    Types: Marijuana    Comment: pain managment last used in early april  . Sexual activity: Not Currently  Other Topics Concern  . Not on file  Social History Narrative  . Not on file   Social Determinants of Health   Financial Resource Strain: Medium Risk  . Difficulty of Paying Living Expenses: Somewhat hard  Food Insecurity: Food Insecurity Present  . Worried About Charity fundraiser in the Last Year: Sometimes true  . Ran Out of Food in the Last Year: Sometimes true  Transportation Needs: Unmet Transportation Needs  . Lack of Transportation  (Medical): Yes  . Lack of Transportation (Non-Medical): Yes  Physical Activity: Insufficiently Active  . Days of Exercise per Week: 1 day  . Minutes of Exercise per Session: 30 min  Stress: Stress Concern Present  . Feeling of Stress : Rather much  Social Connections: Somewhat Isolated  . Frequency of Communication with Friends and Family: Once a week  . Frequency of Social Gatherings with Friends and Family: Three times a week  . Attends Religious Services: More than 4 times per year  . Active Member of Clubs or Organizations: No  . Attends Archivist Meetings: Never  . Marital Status: Never married    Allergies:  Allergies  Allergen Reactions  . Penicillin G Itching, Rash, Anaphylaxis and Palpitations  .  Carbamazepine Other (See Comments)    Medication interaction-prograf  . Hydrocodone-Acetaminophen Itching  . Naproxen Itching    Metabolic Disorder Labs: Lab Results  Component Value Date   HGBA1C 6.6 (H) 07/15/2019   Lab Results  Component Value Date   PROLACTIN 13.4 07/15/2019   PROLACTIN 21.3 05/24/2019   No results found for: CHOL, TRIG, HDL, CHOLHDL, VLDL, LDLCALC Lab Results  Component Value Date   TSH 2.650 07/15/2019   TSH 4.650 (H) 05/24/2019    Therapeutic Level Labs: No results found for: LITHIUM No results found for: VALPROATE No components found for:  CBMZ  Current Medications: Current Outpatient Medications  Medication Sig Dispense Refill  . glucose blood (PRECISION QID TEST) test strip Use once daily Use as instructed.    . baclofen (LIORESAL) 20 MG tablet Take 1 tablet (20 mg total) by mouth 3 (three) times daily as needed for muscle spasms. 90 each 2  . benztropine (COGENTIN) 0.5 MG tablet Take 1 tablet (0.5 mg total) by mouth daily as needed for tremors. 30 tablet 1  . Cranberry 500 MG TABS Take by mouth.    . DULoxetine (CYMBALTA) 60 MG capsule Take 1 capsule (60 mg total) by mouth daily. 90 capsule 0  . hydrOXYzine (VISTARIL) 25  MG capsule Take 1 capsule (25 mg total) by mouth 2 (two) times daily as needed. For sever anxiety attacks 60 capsule 1  . lisinopril (ZESTRIL) 20 MG tablet Take 1 tablet by mouth once daily 90 tablet 0  . Magnesium Bisglycinate (MAG GLYCINATE) 100 MG TABS Take by mouth.    . Melatonin 5 MG CAPS Take 1-2 capsules (5-10 mg total) by mouth at bedtime. 100 capsule 0  . metFORMIN (GLUCOPHAGE) 1000 MG tablet Take 1 tablet by mouth 2 (two) times a day.    . mirabegron ER (MYRBETRIQ) 50 MG TB24 tablet Take 1 tablet (50 mg total) by mouth daily. 30 tablet 6  . mycophenolate (CELLCEPT) 250 MG capsule Take 1 capsule by mouth 2 (two) times a day.    . oxyCODONE (OXYCONTIN) 10 mg 12 hr tablet Take by mouth.    . pregabalin (LYRICA) 200 MG capsule Take 1 capsule (200 mg total) by mouth 3 (three) times daily. 90 capsule 2  . promethazine (PHENERGAN) 25 MG tablet Take 1 tablet by mouth as needed.    [name] QUEtiapine (SEROQUEL XR) 50 MG TB24 24 hr tablet Take 1 tablet (50 mg total) by mouth at bedtime. To be combined with 150 mg 90 tablet 0  . QUEtiapine Fumarate (SEROQUEL XR) 150 MG 24 hr tablet Take 1 tablet (150 mg total) by mouth at bedtime. 90 tablet 0  . Specialty Vitamins Products (MAGNESIUM, AMINO ACID CHELATE,) 133 MG tablet Take 1 tablet by mouth 2 (two) times daily.    . SUMAtriptan (IMITREX) 100 MG tablet TAKE 1 TABLET BY MOUTH ONCE AS NEEDED FOR MIGRAINE. MAY TAKE A SECOND DOSE AFTER 2 HOURS IF NEEDED    . tacrolimus (PROGRAF) 1 MG capsule Take 1 capsule by mouth once daily 30 capsule 0   No current facility-administered medications for this visit.     Musculoskeletal: Strength & Muscle Tone: UTA Gait & Station: normal Patient leans: N/A  Psychiatric Specialty Exam: Review of Systems  Musculoskeletal: Positive for myalgias.  Neurological: Positive for tremors.  Psychiatric/Behavioral: Positive for dysphoric mood and sleep disturbance.  All other systems reviewed and are negative.   Last  menstrual period 02/04/2018.There is no height or weight on file to  calculate BMI.  General Appearance: Casual  Eye Contact:  Fair  Speech:  Clear and Coherent  Volume:  Normal  Mood:  Depressed  Affect:  Tearful  Thought Process:  Goal Directed and Descriptions of Associations: Intact  Orientation:  Full (Time, Place, and Person)  Thought Content: Logical   Suicidal Thoughts:  No  Homicidal Thoughts:  No  Memory:  Immediate;   Fair Recent;   Fair Remote;   Fair  Judgement:  Fair  Insight:  Fair  Psychomotor Activity:  Normal  Concentration:  Concentration: Fair and Attention Span: Fair  Recall:  [employer] of Knowledge: Fair  Language: Fair  Akathisia:  No  Handed:  Right  AIMS (if indicated): Tremors on and off , but does not distress her   Assets:  Communication Skills Desire for Improvement Housing Social Support  ADL's:  Intact  Cognition: WNL  Sleep:  Poor   Screenings: PHQ2-9     Nutrition from 09/23/2019 in [place] Office Visit from 09/16/2019 in [facility] from 08/10/2019 in [facility] Visit from 07/30/2019 in [facility] Office Visit from 06/17/2019 in [facility]  PHQ-2 Total Score  6  2  4   0  4  PHQ-9 Total Score  19  5  5   0  16       Assessment and Plan: Alison [name] is a 44 year old Caucasian female, engaged, on disability, has a history of bipolar disorder, PTSD, multiple medical problems was evaluated by telemedicine today.  She is biologically predisposed given her history of trauma, family history as well as multiple health issues.  Patient with psychosocial stressors of being separated from her father, her own pain, the pandemic, health issues.  Patient is currently struggling with depressive symptoms, grief reaction since it was the death anniversary of her mother, sleep problems.  She will benefit from medication  readjustment and psychotherapy sessions.  Plan as noted below.  Plan Bipolar disorder-unstable Increase Seroquel extended release to 200 mg p.o. nightly Cogentin 0.5 mg p.o. daily as needed Increase Cymbalta 60 mg p.o. daily  PTSD-improving Cymbalta as prescribed Hydroxyzine 25 mg p.o. twice daily as needed for anxiety attacks Melatonin 5 to 10 mg p.o. nightly Patient to continue psychotherapy sessions with therapist [facility] [phone]  Neuroleptic induced Parkinson's disease Cogentin 0.5 mg p.o. daily as needed   Follow-up in clinic in 1 month or sooner if needed.  March 5 at 9:30 AM  I have spent atleast 20 minutes non face to face with patient today. More than 50 % of the time was spent for ordering medications and test ,psychoeducation and supportive psychotherapy and care coordination,as well as documenting clinical information in electronic health record,interpreting results of test and communication of results This note was generated in part or whole with voice recognition software. Voice recognition is usually quite accurate but there are transcription errors that can and very often do occur. I apologize for any typographical errors that were not detected and corrected.       [name], MD 11/12/2019, 9:11 AM

## 2019-11-17 DIAGNOSIS — G43119 Migraine with aura, intractable, without status migrainosus: Secondary | ICD-10-CM | POA: Diagnosis not present

## 2019-11-17 DIAGNOSIS — G500 Trigeminal neuralgia: Secondary | ICD-10-CM | POA: Diagnosis not present

## 2019-11-17 DIAGNOSIS — M797 Fibromyalgia: Secondary | ICD-10-CM | POA: Diagnosis not present

## 2019-11-17 DIAGNOSIS — M5481 Occipital neuralgia: Secondary | ICD-10-CM | POA: Diagnosis not present

## 2019-11-19 DIAGNOSIS — F3181 Bipolar II disorder: Secondary | ICD-10-CM | POA: Diagnosis not present

## 2019-11-29 DIAGNOSIS — G894 Chronic pain syndrome: Secondary | ICD-10-CM | POA: Diagnosis not present

## 2019-12-01 ENCOUNTER — Encounter: Payer: Self-pay | Admitting: Family Medicine

## 2019-12-08 ENCOUNTER — Encounter: Payer: Self-pay | Admitting: Dietician

## 2019-12-08 NOTE — Progress Notes (Signed)
Have not heard back from Ms. Caratachea to reschedule her cancelled appointment from 10/28/19. Sent notification to referring provider.

## 2019-12-10 ENCOUNTER — Other Ambulatory Visit: Payer: Self-pay

## 2019-12-10 ENCOUNTER — Ambulatory Visit (INDEPENDENT_AMBULATORY_CARE_PROVIDER_SITE_OTHER): Payer: Medicaid Other | Admitting: Psychiatry

## 2019-12-10 ENCOUNTER — Encounter: Payer: Self-pay | Admitting: Psychiatry

## 2019-12-10 DIAGNOSIS — F3130 Bipolar disorder, current episode depressed, mild or moderate severity, unspecified: Secondary | ICD-10-CM | POA: Diagnosis not present

## 2019-12-10 DIAGNOSIS — G2111 Neuroleptic induced parkinsonism: Secondary | ICD-10-CM | POA: Diagnosis not present

## 2019-12-10 DIAGNOSIS — F4310 Post-traumatic stress disorder, unspecified: Secondary | ICD-10-CM

## 2019-12-10 MED ORDER — QUETIAPINE FUMARATE ER 150 MG PO TB24: 150.0000 mg | ORAL_TABLET | Freq: Every day | ORAL | 0 refills | Status: DC

## 2019-12-10 NOTE — Progress Notes (Signed)
Provider Location : [facility] Patient Location : Home  Virtual Visit via Video Note  I connected with Alison [name] on 12/10/19 at  9:30 AM EST by a video enabled telemedicine application and verified that I am speaking with the correct person using two identifiers.   I discussed the limitations of evaluation and management by telemedicine and the availability of in person appointments. The patient expressed understanding and agreed to proceed.   I discussed the assessment and treatment plan with the patient. The patient was provided an opportunity to ask questions and all were answered. The patient agreed with the plan and demonstrated an understanding of the instructions.   The patient was advised to call back or seek an in-person evaluation if the symptoms worsen or if the condition fails to improve as anticipated.  BH MD/PA/NP OP Progress Note  12/10/2019 1:15 PM Alison [name]  MRN:  [id number]  Chief Complaint:  Chief Complaint    Follow-up     HPI: Tarahji is a 44 year old female, Caucasian, lives in [place], originally from [place] was evaluated by telemedicine today.  Patient today reports she is currently struggling with sadness due to her adopted sister not being able to visit her.  She however reports he is able to cope with it.  She reports she is worried about her sister who recently got into an accident while driving to visit her.  She reports her sister is safe however her car got damaged.  Patient reports the increased dosage of Cymbalta and Seroquel has definitely helped her.  She continues to report some tremors of her lower lips and hands on and off.  She does not have it every day and she takes Cogentin as needed which helps.  She reports sleep is good.  Patient denies any suicidality, homicidality or perceptual disturbances.  Patient reports she continues to follow-up with her therapist and reports therapy sessions as beneficial.  Patient denies any other  concerns today. Visit Diagnosis:    ICD-10-CM   1. Bipolar I disorder, most recent episode depressed (HCC)  F31.30 QUEtiapine Fumarate (SEROQUEL XR) 150 MG 24 hr tablet   improving  2. PTSD (post-traumatic stress disorder)  F43.10   3. 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  Past Medical History:  Past Medical History:  Diagnosis Date  . Abnormal uterine bleeding   . Allergy   . Anxiety   . Arthritis   . Bipolar disorder (manic depression) ([place])   . Chronic kidney failure   . Chronic renal disease, stage III   . Diabetes mellitus without complication ([place])   . 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  . FH: trigeminal neuralgia   . GERD (gastroesophageal reflux disease)   . Heart murmur   . Hypertension   . Kidney mass   . Lupus ([place])   . Lupus ([place])   . Major depressive disorder   . 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 Migraine   . Morbid obesity ([place])   . Neuromuscular disorder (HCC)    neuropathy  . Neuropathy   . Personality disorder ([place])   . Post herpetic neuralgia   . PTSD (post-traumatic stress disorder)   . Renal disorder   . S/P liver transplant [facility])     Past Surgical History:  Procedure Laterality Date  .  BONE MARROW BIOPSY  01/13/2015  . BREAST BIOPSY  12/2014  . BREAST BIOPSY  2011  . BREAST SURGERY    . CHOLECYSTECTOMY    . HERNIA REPAIR    . infusaport    . LIVER TRANSPLANT  12/17/1991  . LUMBAR PUNCTURE    . PORT A CATH INJECTION ([place] [facility])    . tumor removal  2015    Family Psychiatric History: I have reviewed family psychiatric history from my progress note on 06/10/2019.  Family History:  Family History  Problem Relation Age of Onset  . Heart disease Mother   . Hypertension Mother   . Cancer - Other Mother   . Bipolar disorder Mother   .  Heart disease Father   . Hypertension Father   . Diabetes Father   . Parkinson's disease Maternal Grandmother   . Cancer Maternal Aunt   . Cancer Maternal Uncle   . Cancer Maternal Grandfather   . Lupus Paternal Grandmother   . Hypertension Brother     Social History: I have reviewed social history from my progress note on 06/10/2019. Social History   Socioeconomic History  . Marital status: Single    Spouse name: Not on file  . Number of children: 0  . Years of education: 9  . Highest education level: GED or equivalent  Occupational History  . Occupation: disabled  Tobacco Use  . Smoking status: Never Smoker  . Smokeless tobacco: Never Used  Substance and Sexual Activity  . Alcohol use: Not Currently  . Drug use: Not Currently    Types: Marijuana    Comment: pain managment last used in early april  . Sexual activity: Not Currently  Other Topics Concern  . Not on file  Social History Narrative  . Not on file   Social Determinants of Health   Financial Resource Strain: Medium Risk  . Difficulty of Paying Living Expenses: Somewhat hard  Food Insecurity: Food Insecurity Present  . Worried About Charity fundraiser in the Last Year: Sometimes true  . Ran Out of Food in the Last Year: Sometimes true  Transportation Needs: Unmet Transportation Needs  . Lack of Transportation (Medical): Yes  . Lack of Transportation (Non-Medical): Yes  Physical Activity: Insufficiently Active  . Days of Exercise per Week: 1 day  . Minutes of Exercise per Session: 30 min  Stress: Stress Concern Present  . Feeling of Stress : Rather much  Social Connections: Somewhat Isolated  . Frequency of Communication with Friends and Family: Once a week  . Frequency of Social Gatherings with Friends and Family: Three times a week  . Attends Religious Services: More than 4 times per year  . Active Member of Clubs or Organizations: No  . Attends Archivist Meetings: Never  . Marital Status:  Never married    Allergies:  Allergies  Allergen Reactions  . Penicillin G Itching, Rash, Anaphylaxis and Palpitations  . Carbamazepine Other (See Comments)    Medication interaction-prograf  . Hydrocodone-Acetaminophen Itching  . Naproxen Itching    Metabolic Disorder Labs: Lab Results  Component Value Date   HGBA1C 6.6 (H) 07/15/2019   Lab Results  Component Value Date   PROLACTIN 13.4 07/15/2019   PROLACTIN 21.3 05/24/2019   No results found for: CHOL, TRIG, HDL, CHOLHDL, VLDL, LDLCALC Lab Results  Component Value Date   TSH 2.650 07/15/2019   TSH 4.650 (H) 05/24/2019    Therapeutic Level Labs: No results found for: LITHIUM No results found  for: VALPROATE No components found for:  CBMZ  Current Medications: Current Outpatient Medications  Medication Sig Dispense Refill  . baclofen (LIORESAL) 20 MG tablet Take 1 tablet (20 mg total) by mouth 3 (three) times daily as needed for muscle spasms. 90 each 2  . benztropine (COGENTIN) 0.5 MG tablet Take 1 tablet (0.5 mg total) by mouth daily as needed for tremors. 30 tablet 1  . Cranberry 500 MG TABS Take by mouth.    . DULoxetine (CYMBALTA) 60 MG capsule Take 1 capsule (60 mg total) by mouth daily. 90 capsule 0  . glucose blood (PRECISION QID TEST) test strip Use once daily Use as instructed.    . hydrOXYzine (VISTARIL) 25 MG capsule Take 1 capsule (25 mg total) by mouth 2 (two) times daily as needed. For sever anxiety attacks 60 capsule 1  . lisinopril (ZESTRIL) 20 MG tablet Take 1 tablet by mouth once daily 90 tablet 0  . Magnesium Bisglycinate (MAG GLYCINATE) 100 MG TABS Take by mouth.    . Melatonin 5 MG CAPS Take 1-2 capsules (5-10 mg total) by mouth at bedtime. 100 capsule 0  . metFORMIN (GLUCOPHAGE) 1000 MG tablet Take 1 tablet by mouth 2 (two) times a day.    . mirabegron ER (MYRBETRIQ) 50 MG TB24 tablet Take 1 tablet (50 mg total) by mouth daily. 30 tablet 6  . mycophenolate (CELLCEPT) 250 MG capsule Take 1  capsule by mouth 2 (two) times a day.    . oxyCODONE (OXYCONTIN) 10 mg 12 hr tablet Take by mouth.    . pregabalin (LYRICA) 200 MG capsule Take 1 capsule (200 mg total) by mouth 3 (three) times daily. 90 capsule 2  . promethazine (PHENERGAN) 25 MG tablet Take 1 tablet by mouth as needed.    [name] QUEtiapine (SEROQUEL XR) 50 MG TB24 24 hr tablet Take 1 tablet (50 mg total) by mouth at bedtime. To be combined with 150 mg 90 tablet 0  . QUEtiapine Fumarate (SEROQUEL XR) 150 MG 24 hr tablet Take 1 tablet (150 mg total) by mouth at bedtime. 90 tablet 0  . Specialty Vitamins Products (MAGNESIUM, AMINO ACID CHELATE,) 133 MG tablet Take 1 tablet by mouth 2 (two) times daily.    . SUMAtriptan (IMITREX) 100 MG tablet TAKE 1 TABLET BY MOUTH ONCE AS NEEDED FOR MIGRAINE. MAY TAKE A SECOND DOSE AFTER 2 HOURS IF NEEDED    . tacrolimus (PROGRAF) 1 MG capsule Take 1 capsule by mouth once daily 30 capsule 0   No current facility-administered medications for this visit.     Musculoskeletal: Strength & Muscle Tone: UTA Gait & Station: normal Patient leans: N/A  Psychiatric Specialty Exam: Review of Systems  Neurological: Positive for tremors.  Psychiatric/Behavioral: The patient is nervous/anxious.   All other systems reviewed and are negative.   Last menstrual period 02/04/2018.There is no height or weight on file to calculate BMI.  General Appearance: Casual  Eye Contact:  Fair  Speech:  Normal Rate  Volume:  Normal  Mood:  Anxious  Affect:  Congruent  Thought Process:  Goal Directed and Descriptions of Associations: Intact  Orientation:  Full (Time, Place, and Person)  Thought Content: Logical   Suicidal Thoughts:  No  Homicidal Thoughts:  No  Memory:  Immediate;   Fair Recent;   Fair Remote;   Fair  Judgement:  Fair  Insight:  Fair  Psychomotor Activity:  Normal  Concentration:  Concentration: Fair and Attention Span: Fair  Recall:  Fair  Fund of Knowledge: Fair  Language: Fair  Akathisia:   No  Handed:  Right  AIMS (if indicated): tremors or lips and hands on and off   Assets:  Communication Skills Desire for Improvement Housing Social Support  ADL's:  Intact  Cognition: WNL  Sleep:  Fair   Screenings: PHQ2-9     Nutrition from 09/23/2019 in [facility] Office Visit from 09/16/2019 in [place] Management from 08/10/2019 in [facility] Office Visit from 07/30/2019 in [facility] Office Visit from 06/17/2019 in [facility]  PHQ-2 Total Score  6  2  4   0  4  PHQ-9 Total Score  19  5  5   0  16       Assessment and Plan: Maple is a 44 year old Caucasian female, engaged, on disability, has a history of bipolar disorder, PTSD, history of borderline personality disorder, chronic pain from fibromyalgia, trigeminal neuralgia, history of liver transplant, B-cell lymphoma, SLE, diabetes melitis, hypertension, OSA, migraine headaches was evaluated by telemedicine today.  Patient is biologically predisposed given her history of trauma, family history of multiple health issues.  Patient with psychosocial stressors of being separated from her father, her own pain and the pandemic.  Patient is currently making progress with regards to her mood symptoms however will continue to benefit from psychotherapy sessions.  Plan as noted below.  Plan Bipolar disorder-improving Seroquel extended release 200 mg p.o. nightly Cogentin 0.5 mg p.o. daily as needed Cymbalta 60 mg p.o. daily  PTSD-improving Cymbalta as prescribed Hydroxyzine 25 mg p.o. twice daily as needed for anxiety attacks Melatonin 5 to 10 mg p.o. nightly Patient to continue psychotherapy sessions with therapist in [place].  Neuroleptic induced Parkinson's disease-improving Cogentin 0.5 mg p.o. daily as needed  Follow-up in clinic in 1 month or sooner if needed.  I have spent atleast 20  minutes non face to face with patient today. More than 50 % of the time was spent for ordering medications and test ,psychoeducation and supportive psychotherapy and care coordination,as well as documenting clinical information in electronic health record. This note was generated in part or whole with voice recognition software. Voice recognition is usually quite accurate but there are transcription errors that can and very often do occur. I apologize for any typographical errors that were not detected and corrected.       [name], MD 12/10/2019, 1:15 PM

## 2019-12-15 DIAGNOSIS — F320 Major depressive disorder, single episode, mild: Secondary | ICD-10-CM | POA: Diagnosis not present

## 2019-12-15 DIAGNOSIS — G500 Trigeminal neuralgia: Secondary | ICD-10-CM | POA: Diagnosis not present

## 2019-12-15 DIAGNOSIS — M3500 Sicca syndrome, unspecified: Secondary | ICD-10-CM | POA: Diagnosis not present

## 2019-12-15 DIAGNOSIS — G43909 Migraine, unspecified, not intractable, without status migrainosus: Secondary | ICD-10-CM | POA: Diagnosis not present

## 2019-12-15 DIAGNOSIS — M797 Fibromyalgia: Secondary | ICD-10-CM | POA: Diagnosis not present

## 2019-12-15 DIAGNOSIS — G894 Chronic pain syndrome: Secondary | ICD-10-CM | POA: Diagnosis not present

## 2019-12-21 ENCOUNTER — Other Ambulatory Visit: Payer: Self-pay

## 2019-12-21 ENCOUNTER — Ambulatory Visit: Payer: Medicaid Other | Admitting: Gastroenterology

## 2019-12-30 ENCOUNTER — Ambulatory Visit: Payer: Medicaid Other | Attending: Internal Medicine

## 2019-12-30 ENCOUNTER — Ambulatory Visit: Payer: Medicaid Other

## 2019-12-30 DIAGNOSIS — Z23 Encounter for immunization: Secondary | ICD-10-CM

## 2019-12-30 NOTE — Progress Notes (Signed)
   Covid-19 Vaccination Clinic  Name:  Alison [name]    MRN: [id number] DOB: [birth year]  12/30/2019  [name] was observed post Covid-19 immunization for 15 minutes without incident. She was provided with Vaccine Information Sheet and instruction to access the V-Safe system.   [name] was instructed to call 911 with any severe reactions post vaccine: [name] Difficulty breathing  . Swelling of face and throat  . A fast heartbeat  . A bad rash all over body  . Dizziness and weakness   Immunizations Administered    Name Date Dose VIS Date Route   Pfizer COVID-19 Vaccine 12/30/2019  4:17 PM 0.3 mL 09/17/2019 Intramuscular   Manufacturer: [place]   Lot: [id number]   [place]: [address]-0

## 2019-12-31 DIAGNOSIS — F3181 Bipolar II disorder: Secondary | ICD-10-CM | POA: Diagnosis not present

## 2020-01-03 DIAGNOSIS — Z6841 Body Mass Index (BMI) 40.0 and over, adult: Secondary | ICD-10-CM | POA: Diagnosis not present

## 2020-01-03 DIAGNOSIS — E119 Type 2 diabetes mellitus without complications: Secondary | ICD-10-CM | POA: Diagnosis not present

## 2020-01-03 DIAGNOSIS — N1831 Chronic kidney disease, stage 3a: Secondary | ICD-10-CM | POA: Diagnosis not present

## 2020-01-05 DIAGNOSIS — G894 Chronic pain syndrome: Secondary | ICD-10-CM | POA: Diagnosis not present

## 2020-01-13 ENCOUNTER — Inpatient Hospital Stay: Payer: Medicaid Other | Attending: Oncology

## 2020-01-13 ENCOUNTER — Other Ambulatory Visit: Payer: Self-pay

## 2020-01-13 DIAGNOSIS — Z9221 Personal history of antineoplastic chemotherapy: Secondary | ICD-10-CM | POA: Diagnosis not present

## 2020-01-13 DIAGNOSIS — G500 Trigeminal neuralgia: Secondary | ICD-10-CM | POA: Diagnosis not present

## 2020-01-13 DIAGNOSIS — Z8619 Personal history of other infectious and parasitic diseases: Secondary | ICD-10-CM | POA: Insufficient documentation

## 2020-01-13 DIAGNOSIS — N183 Chronic kidney disease, stage 3 unspecified: Secondary | ICD-10-CM | POA: Insufficient documentation

## 2020-01-13 DIAGNOSIS — Z944 Liver transplant status: Secondary | ICD-10-CM | POA: Diagnosis not present

## 2020-01-13 DIAGNOSIS — R5381 Other malaise: Secondary | ICD-10-CM | POA: Diagnosis not present

## 2020-01-13 DIAGNOSIS — Z79899 Other long term (current) drug therapy: Secondary | ICD-10-CM | POA: Insufficient documentation

## 2020-01-13 DIAGNOSIS — R5383 Other fatigue: Secondary | ICD-10-CM | POA: Insufficient documentation

## 2020-01-13 DIAGNOSIS — E1122 Type 2 diabetes mellitus with diabetic chronic kidney disease: Secondary | ICD-10-CM | POA: Insufficient documentation

## 2020-01-13 DIAGNOSIS — I129 Hypertensive chronic kidney disease with stage 1 through stage 4 chronic kidney disease, or unspecified chronic kidney disease: Secondary | ICD-10-CM | POA: Insufficient documentation

## 2020-01-13 DIAGNOSIS — M479 Spondylosis, unspecified: Secondary | ICD-10-CM | POA: Diagnosis not present

## 2020-01-13 DIAGNOSIS — R945 Abnormal results of liver function studies: Secondary | ICD-10-CM | POA: Diagnosis not present

## 2020-01-13 DIAGNOSIS — Z7984 Long term (current) use of oral hypoglycemic drugs: Secondary | ICD-10-CM | POA: Insufficient documentation

## 2020-01-13 DIAGNOSIS — C884 Extranodal marginal zone B-cell lymphoma of mucosa-associated lymphoid tissue [MALT-lymphoma]: Secondary | ICD-10-CM | POA: Diagnosis not present

## 2020-01-13 DIAGNOSIS — Z08 Encounter for follow-up examination after completed treatment for malignant neoplasm: Secondary | ICD-10-CM

## 2020-01-13 LAB — CBC WITH DIFFERENTIAL/PLATELET
Abs Immature Granulocytes: 0.05 10*3/uL (ref 0.00–0.07)
Basophils Absolute: 0.1 10*3/uL (ref 0.0–0.1)
Basophils Relative: 1 %
Eosinophils Absolute: 0.5 10*3/uL (ref 0.0–0.5)
Eosinophils Relative: 9 %
HCT: 42.6 % (ref 36.0–46.0)
Hemoglobin: 14.1 g/dL (ref 12.0–15.0)
Immature Granulocytes: 1 %
Lymphocytes Relative: 28 %
Lymphs Abs: 1.6 10*3/uL (ref 0.7–4.0)
MCH: 28.7 pg (ref 26.0–34.0)
MCHC: 33.1 g/dL (ref 30.0–36.0)
MCV: 86.8 fL (ref 80.0–100.0)
Monocytes Absolute: 0.5 10*3/uL (ref 0.1–1.0)
Monocytes Relative: 9 %
Neutro Abs: 3.1 10*3/uL (ref 1.7–7.7)
Neutrophils Relative %: 52 %
Platelets: 160 10*3/uL (ref 150–400)
RBC: 4.91 MIL/uL (ref 3.87–5.11)
RDW: 13.8 % (ref 11.5–15.5)
WBC: 5.9 10*3/uL (ref 4.0–10.5)
nRBC: 0 % (ref 0.0–0.2)

## 2020-01-13 LAB — COMPREHENSIVE METABOLIC PANEL
ALT: 62 U/L — ABNORMAL HIGH (ref 0–44)
AST: 93 U/L — ABNORMAL HIGH (ref 15–41)
Albumin: 4.1 g/dL (ref 3.5–5.0)
Alkaline Phosphatase: 79 U/L (ref 38–126)
Anion gap: 13 (ref 5–15)
BUN: 18 mg/dL (ref 6–20)
CO2: 25 mmol/L (ref 22–32)
Calcium: 9.7 mg/dL (ref 8.9–10.3)
Chloride: 100 mmol/L (ref 98–111)
Creatinine, Ser: 1.12 mg/dL — ABNORMAL HIGH (ref 0.44–1.00)
GFR calc Af Amer: >60 mL/min (ref >60)
GFR calc non Af Amer: 59 mL/min — ABNORMAL LOW (ref >60)
Glucose, Bld: 177 mg/dL — ABNORMAL HIGH (ref 70–99)
Potassium: 5 mmol/L (ref 3.5–5.1)
Sodium: 138 mmol/L (ref 135–145)
Total Bilirubin: 0.8 mg/dL (ref 0.3–1.2)
Total Protein: 7.9 g/dL (ref 6.5–8.1)

## 2020-01-13 LAB — LACTATE DEHYDROGENASE: LDH: 177 U/L (ref 98–192)

## 2020-01-14 ENCOUNTER — Inpatient Hospital Stay: Payer: Medicaid Other

## 2020-01-14 ENCOUNTER — Encounter: Payer: Self-pay | Admitting: Psychiatry

## 2020-01-14 ENCOUNTER — Ambulatory Visit (INDEPENDENT_AMBULATORY_CARE_PROVIDER_SITE_OTHER): Payer: Medicaid Other | Admitting: Psychiatry

## 2020-01-14 ENCOUNTER — Encounter: Payer: Self-pay | Admitting: Oncology

## 2020-01-14 ENCOUNTER — Inpatient Hospital Stay (HOSPITAL_BASED_OUTPATIENT_CLINIC_OR_DEPARTMENT_OTHER): Payer: Medicaid Other | Admitting: Oncology

## 2020-01-14 DIAGNOSIS — R945 Abnormal results of liver function studies: Secondary | ICD-10-CM

## 2020-01-14 DIAGNOSIS — F4310 Post-traumatic stress disorder, unspecified: Secondary | ICD-10-CM | POA: Diagnosis not present

## 2020-01-14 DIAGNOSIS — G2111 Neuroleptic induced parkinsonism: Secondary | ICD-10-CM | POA: Diagnosis not present

## 2020-01-14 DIAGNOSIS — C8580 Other specified types of non-Hodgkin lymphoma, unspecified site: Secondary | ICD-10-CM

## 2020-01-14 DIAGNOSIS — F3176 Bipolar disorder, in full remission, most recent episode depressed: Secondary | ICD-10-CM

## 2020-01-14 DIAGNOSIS — R7989 Other specified abnormal findings of blood chemistry: Secondary | ICD-10-CM

## 2020-01-14 MED ORDER — BENZTROPINE MESYLATE 0.5 MG PO TABS: 0.5000 mg | ORAL_TABLET | Freq: Every day | ORAL | 1 refills | Status: DC | PRN

## 2020-01-14 MED ORDER — HYDROXYZINE PAMOATE 25 MG PO CAPS: 25.0000 mg | ORAL_CAPSULE | Freq: Two times a day (BID) | ORAL | 1 refills | Status: DC | PRN

## 2020-01-14 NOTE — Progress Notes (Signed)
Provider Location : [facility] Patient Location : Home  Virtual Visit via Video Note  I connected with Alison [name] on 01/14/20 at 10:20 AM EDT by a video enabled telemedicine application and verified that I am speaking with the correct person using two identifiers.   I discussed the limitations of evaluation and management by telemedicine and the availability of in person appointments. The patient expressed understanding and agreed to proceed.   I discussed the assessment and treatment plan with the patient. The patient was provided an opportunity to ask questions and all were answered. The patient agreed with the plan and demonstrated an understanding of the instructions.   The patient was advised to call back or seek an in-person evaluation if the symptoms worsen or if the condition fails to improve as anticipated.   [facility] MD OP Progress Note  01/14/2020 12:24 PM Alison [name]  MRN:  [id number]  Chief Complaint:  Chief Complaint    Follow-up     HPI: Alison [name] is a 44 year old female, Caucasian, lives in [place], originally from [place] was evaluated by telemedicine today.  Patient today reports she is currently doing well with regards to her mood.  She denies any sadness or anxiety symptoms.  She reports she is taking her medications as prescribed.  She denies side effects.  She reports she is currently struggling with headaches.  She is waiting for an appointment with her neurologist.  She does not know what could be contributing to it.  Patient denies any suicidality, homicidality or perceptual disturbances.  She does have mild tremors on and off likely secondary to Seroquel.  However they are better than before and she continues to take Cogentin as needed.  She reports she is spending time with her pet parakeet as well as looks forward to a visit from her sister.  They are planning to attend a convention together soon.  She is getting everything ready for the  same.  Patient denies any other concerns today. Visit Diagnosis:    ICD-10-CM   1. Bipolar disorder, in full remission, most recent episode depressed ([place])  F31.76   2. PTSD (post-traumatic stress disorder)  F43.10 hydrOXYzine (VISTARIL) 25 MG capsule  3. 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 Abnormal uterine bleeding   . Allergy   . Anxiety   . Arthritis   . Bipolar disorder (manic depression) ([place])   . Chronic kidney failure   . Chronic renal disease, stage III   . Diabetes mellitus without complication ([place])   . 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  . FH: trigeminal neuralgia   . GERD (gastroesophageal reflux disease)   . Heart murmur   . Hypertension   . Kidney mass   . Lupus ([place])   . Lupus ([place])   . Major depressive disorder   . 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 Migraine   . Morbid obesity ([place])   . Neuromuscular disorder (H[facility])    neuropathy  . Neuropathy   . Personality disorder ([place])   . Post herpetic neuralgia   . PTSD (post-traumatic stress disorder)   . Renal disorder   . S/P liver transplant [facility])     Past Surgical History:  Procedure  Laterality Date  . BONE MARROW BIOPSY  01/13/2015  . BREAST BIOPSY  12/2014  . BREAST BIOPSY  2011  . BREAST SURGERY    . CHOLECYSTECTOMY    . HERNIA REPAIR    . infusaport    . LIVER TRANSPLANT  12/17/1991  . LUMBAR PUNCTURE    . PORT A CATH INJECTION ([place] [facility])    . tumor removal  2015    Family Psychiatric History: I have reviewed family psychiatric history from my progress note on 06/10/2019  Family History:  Family History  Problem Relation Age of Onset  . Heart disease Mother   . Hypertension Mother   .  Cancer - Other Mother   . Bipolar disorder Mother   . Heart disease Father   . Hypertension Father   . Diabetes Father   . Parkinson's disease Maternal Grandmother   . Cancer Maternal Aunt   . Cancer Maternal Uncle   . Cancer Maternal Grandfather   . Lupus Paternal Grandmother   . Hypertension Brother     Social History: I have reviewed social history from my progress note on 06/10/2019 Social History   Socioeconomic History  . Marital status: Single    Spouse name: Not on file  . Number of children: 0  . Years of education: 9  . Highest education level: GED or equivalent  Occupational History  . Occupation: disabled  Tobacco Use  . Smoking status: Never Smoker  . Smokeless tobacco: Never Used  Substance and Sexual Activity  . Alcohol use: Not Currently  . Drug use: Not Currently    Types: Marijuana    Comment: pain managment last used in early april  . Sexual activity: Not Currently  Other Topics Concern  . Not on file  Social History Narrative  . Not on file   Social Determinants of Health   Financial Resource Strain: Medium Risk  . Difficulty of Paying Living Expenses: Somewhat hard  Food Insecurity: Food Insecurity Present  . Worried About Charity fundraiser in the Last Year: Sometimes true  . Ran Out of Food in the Last Year: Sometimes true  Transportation Needs: Unmet Transportation Needs  . Lack of Transportation (Medical): Yes  . Lack of Transportation (Non-Medical): Yes  Physical Activity: Insufficiently Active  . Days of Exercise per Week: 1 day  . Minutes of Exercise per Session: 30 min  Stress: Stress Concern Present  . Feeling of Stress : Rather much  Social Connections: Somewhat Isolated  . Frequency of Communication with Friends and Family: Once a week  . Frequency of Social Gatherings with Friends and Family: Three times a week  . Attends Religious Services: More than 4 times per year  . Active Member of Clubs or Organizations: No  . Attends  Archivist Meetings: Never  . Marital Status: Never married    Allergies:  Allergies  Allergen Reactions  . Penicillin G Itching, Rash, Anaphylaxis and Palpitations  . Carbamazepine Other (See Comments)    Medication interaction-prograf  . Hydrocodone-Acetaminophen Itching  . Naproxen Itching    Metabolic Disorder Labs: Lab Results  Component Value Date   HGBA1C 6.6 (H) 07/15/2019   Lab Results  Component Value Date   PROLACTIN 13.4 07/15/2019   PROLACTIN 21.3 05/24/2019   No results found for: CHOL, TRIG, HDL, CHOLHDL, VLDL, LDLCALC Lab Results  Component Value Date   TSH 2.650 07/15/2019   TSH 4.650 (H) 05/24/2019    Therapeutic Level Labs: No results found for:  LITHIUM No results found for: VALPROATE No components found for:  CBMZ  Current Medications: Current Outpatient Medications  Medication Sig Dispense Refill  . liraglutide (VICTOZA) 18 MG/3ML SOPN Inject into the skin.    . baclofen (LIORESAL) 20 MG tablet Take 1 tablet (20 mg total) by mouth 3 (three) times daily as needed for muscle spasms. 90 each 2  . benztropine (COGENTIN) 0.5 MG tablet Take 1 tablet (0.5 mg total) by mouth daily as needed for tremors. 30 tablet 1  . Cranberry 500 MG TABS Take by mouth.    . DULoxetine (CYMBALTA) 60 MG capsule Take 1 capsule (60 mg total) by mouth daily. 90 capsule 0  . glucose blood (PRECISION QID TEST) test strip Use once daily Use as instructed.    . hydrOXYzine (VISTARIL) 25 MG capsule Take 1 capsule (25 mg total) by mouth 2 (two) times daily as needed. For sever anxiety attacks 60 capsule 1  . Lidocaine 0.5 % AERO Apply topically.    [name] lisinopril (ZESTRIL) 20 MG tablet Take 1 tablet by mouth once daily 90 tablet 0  . Magnesium Bisglycinate (MAG GLYCINATE) 100 MG TABS Take by mouth.    . Melatonin 5 MG CAPS Take 1-2 capsules (5-10 mg total) by mouth at bedtime. 100 capsule 0  . metFORMIN (GLUCOPHAGE) 1000 MG tablet Take 1 tablet by mouth 2 (two) times a  day.    . mirabegron ER (MYRBETRIQ) 50 MG TB24 tablet Take 1 tablet (50 mg total) by mouth daily. 30 tablet 6  . mycophenolate (CELLCEPT) 250 MG capsule Take 1 capsule by mouth 2 (two) times a day.    . oxyCODONE (OXYCONTIN) 10 mg 12 hr tablet Take by mouth.    . pregabalin (LYRICA) 200 MG capsule Take 1 capsule (200 mg total) by mouth 3 (three) times daily. 90 capsule 2  . promethazine (PHENERGAN) 25 MG tablet Take 1 tablet by mouth as needed.    [name] QUEtiapine (SEROQUEL XR) 50 MG TB24 24 hr tablet Take 1 tablet (50 mg total) by mouth at bedtime. To be combined with 150 mg 90 tablet 0  . QUEtiapine Fumarate (SEROQUEL XR) 150 MG 24 hr tablet Take 1 tablet (150 mg total) by mouth at bedtime. 90 tablet 0  . Specialty Vitamins Products (MAGNESIUM, AMINO ACID CHELATE,) 133 MG tablet Take 1 tablet by mouth 2 (two) times daily.    . SUMAtriptan (IMITREX) 100 MG tablet TAKE 1 TABLET BY MOUTH ONCE AS NEEDED FOR MIGRAINE. MAY TAKE A SECOND DOSE AFTER 2 HOURS IF NEEDED    . tacrolimus (PROGRAF) 1 MG capsule Take 1 capsule by mouth once daily 30 capsule 0   No current facility-administered medications for this visit.     Musculoskeletal: Strength & Muscle Tone: UTA Gait & Station: normal Patient leans: N/A  Psychiatric Specialty Exam: Review of Systems  Psychiatric/Behavioral: Negative for agitation, behavioral problems, confusion, decreased concentration, dysphoric mood, hallucinations, self-injury, sleep disturbance and suicidal ideas. The patient is not nervous/anxious and is not hyperactive.   All other systems reviewed and are negative.   Last menstrual period 02/04/2018.There is no height or weight on file to calculate BMI.  General Appearance: Casual  Eye Contact:  Fair  Speech:  Clear and Coherent  Volume:  Normal  Mood:  Euthymic  Affect:  Congruent  Thought Process:  Goal Directed and Descriptions of Associations: Intact  Orientation:  Full (Time, Place, and Person)  Thought Content:  Logical   Suicidal Thoughts:  No  Homicidal Thoughts:  No  Memory:  Immediate;   Fair Recent;   Fair Remote;   Fair  Judgement:  Fair  Insight:  Fair  Psychomotor Activity:  Normal  Concentration:  Concentration: Fair and Attention Span: Fair  Recall:  [employer] of Knowledge: Fair  Language: Fair  Akathisia:  No  Handed:  Right  AIMS (if indicated): UTA  Assets:  Communication Skills Desire for Improvement Housing Social Support  ADL's:  Intact  Cognition: WNL  Sleep:  Fair   Screenings: PHQ2-9     Nutrition from 09/23/2019 in [facility] Office Visit from 09/16/2019 in [facility] Management from 08/10/2019 in [facility] Office Visit from 07/30/2019 in [facility] Office Visit from 06/17/2019 in [facility]  PHQ-2 Total Score  6  2  4   0  4  PHQ-9 Total Score  19  5  5   0  16       Assessment and Plan: [place] is a 44 year old Caucasian female, engaged, on disability, has a history of bipolar disorder, PTSD, history of borderline personality disorder, chronic pain, fibromyalgia, trigeminal neuralgia, history of liver transplant, B-cell lymphoma, SLE, diabetes melitis, hypertension, OSA, migraine headaches was evaluated by telemedicine today.  Patient with psychosocial stressors of her own health issues as well as the current pandemic.  Patient is biologically predisposed given her history of trauma and multiple health issues.  Patient is currently making progress.  Plan as noted below.  Plan Bipolar disorder in remission Seroquel extended release 200 mg p.o. nightly Cogentin 0.5 mg p.o. daily as needed Cymbalta 60 mg p.o. daily  PTSD-stable Cymbalta as prescribed Hydroxyzine 25 mg p.o. twice daily as needed for anxiety attacks Melatonin 5 to 10 mg p.o. nightly Patient to continue psychotherapy sessions with therapist in [place].  Neuroleptic induced Parkinson's disease-stable Cogentin 0.5 mg p.o. daily as needed  Follow-up in clinic in 1 month or sooner if needed.  I have spent atleast 20 minutes non face to face with patient today. More than 50 % of the time was spent for preparing to see the patient ( e.g., review of test, records ),  ordering medications and test ,psychoeducation and supportive psychotherapy and care coordination,as well as documenting clinical information in electronic health record. This note was generated in part or whole with voice recognition software. Voice recognition is usually quite accurate but there are transcription errors that can and very often do occur. I apologize for any typographical errors that were not detected and corrected.      [name], MD 01/14/2020, 12:24 PM

## 2020-01-14 NOTE — Progress Notes (Signed)
Patient has video visit this afternoon, mentioned a headache with ear pain that was constant last night goes from 0-7 then back to zero.

## 2020-01-18 NOTE — Progress Notes (Signed)
I connected with Alison [name] on 01/18/20 at  1:00 PM EDT by video enabled telemedicine visit and verified that I am speaking with the correct person using two identifiers.   I discussed the limitations, risks, security and privacy concerns of performing an evaluation and management service by telemedicine and the availability of in-person appointments. I also discussed with the patient that there may be a patient responsible charge related to this service. The patient expressed understanding and agreed to proceed.  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 She is currently on CellCept and tacrolimus for the same. She also has a history of post transplant lymphoproliferative disorder/DLBCL that was diagnosed in 2016. She is s/p 6 cycles of R-CHOP chemotherapy back then and was in complete remission 1. Her other past medical history significant for migraines, stage III chronic kidney disease, chemo-induced peripheral neuropathy, hypertension among other medical problems. With regards to her diffuse large B-cell lymphoma she was getting surveillance scans and this was all done in [place]. She has now moved to [facility] to be with her significant other.Patient was noted to have a prior kidney mass before which was biopsied and was not consistent with malignancy.  She underwent CT neck with contrast before she left [place] on 02/22/2019. She was noted to have soft tissue masses in both the parotid glands which were new as compared to prior exams and possibly represent lymph nodes. The largest  one was seen in the left parotid gland measuring 1.3 x 1.1 cm. Before she could get a work-up for this patient moved to [place] and has not had any further work-up yet  Her other prior imaging is as follows: MRI thoracic spine without contrast in August 2019 showed moderate multilevel spondylosis but no evidence of lymphoma. Multinodular thyroid in June 2019. MRI cervical spine May 2019 again showed spondylosis but no other acute pathology. MRI brain showed incidental partial opacification of the right and right sinus. No evidence of malignancy. I do not have any other PET CT scan or treatment records from the past.  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 Bone marrow biopsywas negative for lymphoma    Interval history: Patient reports doing well and follows up with [name] transplant hepatology as well.  She will be getting Covid vaccine per their discretion.  Patient reports doing well her appetite and weight is stable.  Reports pain in the left side of her neck but is not have any lumps that she can feel.  Denies any night sweats.  She has chronic xerostomia which has remained essentially stable   Review of Systems  Constitutional: Positive for malaise/fatigue. Negative for chills, fever and weight loss.  HENT: Negative for congestion, ear discharge and nosebleeds.        Dry mouth  Eyes: Negative for blurred vision.  Respiratory: Negative for cough, hemoptysis, sputum production, shortness of breath and wheezing.   Cardiovascular: Negative for chest pain, palpitations, orthopnea and claudication.  Gastrointestinal: Negative for abdominal pain, blood in stool, constipation, diarrhea, heartburn, melena, nausea and vomiting.  Genitourinary: Negative for dysuria, flank pain, frequency, hematuria and urgency.  Musculoskeletal: Negative for back pain, joint pain and myalgias.  Skin: Negative for rash.  Neurological: Negative for  dizziness, tingling, focal weakness, seizures, weakness and headaches.  Endo/Heme/Allergies: Does not bruise/bleed easily.  Psychiatric/Behavioral: Negative for depression and suicidal ideas. The patient does not have insomnia.     Allergies  Allergen Reactions  . Penicillin G Itching, Rash, Anaphylaxis and Palpitations  . Carbamazepine Other (See Comments)    Medication interaction-prograf  . Hydrocodone-Acetaminophen Itching  . Naproxen Itching    Past Medical History:  Diagnosis Date  . Abnormal uterine bleeding   . Allergy   . Anxiety   . Arthritis   . Bipolar disorder (manic depression) ([place])   . Chronic kidney failure   . Chronic renal disease, stage III   . Diabetes mellitus without complication ([place])   . 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[place]) 2015   Right axillary lymph node resected and chemo tx's.  . FH: trigeminal neuralgia   . GERD (gastroesophageal reflux disease)   . Heart murmur   . Hypertension   . Kidney mass   . Lupus ([place])   . Lupus ([place])   . Major depressive disorder   . 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[place]) 06/2019   Chemo tx's  . Migraine   . Morbid obesity ([place])   . Neuromuscular disorder (H[facility])    neuropathy  . Neuropathy   . Personality disorder ([place])   . Post herpetic neuralgia   . PTSD (post-traumatic stress disorder)   . Renal disorder   . S/P liver transplant [facility])     Past Surgical History:  Procedure Laterality Date  . BONE MARROW BIOPSY  01/13/2015  . BREAST BIOPSY  12/2014  . BREAST BIOPSY  2011  . BREAST SURGERY    . CHOLECYSTECTOMY    . HERNIA REPAIR    . infusaport    . LIVER TRANSPLANT  12/17/1991  . LUMBAR PUNCTURE    . PORT A CATH INJECTION ([place] [facility])    . tumor removal  2015    Social History   Socioeconomic History  . Marital status: Single    Spouse name: Not on file  . Number of children: 0  . Years of education: 9  . Highest education level: GED or equivalent  Occupational History  . Occupation:  disabled  Tobacco Use  . Smoking status: Never Smoker  . Smokeless tobacco: Never Used  Substance and Sexual Activity  . Alcohol use: Not Currently  . Drug use: Not Currently    Types: Marijuana    Comment: pain managment last used in early april  . Sexual activity: Not Currently  Other Topics Concern  . Not on file  Social History Narrative  . Not on file   Social Determinants of Health   Financial Resource Strain: Medium Risk  . Difficulty of Paying Living Expenses: Somewhat hard  Food Insecurity: Food Insecurity Present  . Worried About Charity fundraiser in the Last Year: Sometimes true  . Ran Out of Food in the Last Year: Sometimes true  Transportation Needs: Unmet Transportation Needs  . Lack of Transportation (Medical): Yes  . Lack of Transportation (Non-Medical): Yes  Physical Activity: Insufficiently Active  . Days of Exercise per Week: 1 day  . Minutes of Exercise per Session: 30 min  Stress: Stress Concern Present  . Feeling of Stress : Rather much  Social Connections: Somewhat Isolated  . Frequency of Communication with Friends and Family: Once a week  . Frequency  of Social Gatherings with Friends and Family: Three times a week  . Attends Religious Services: More than 4 times per year  . Active Member of Clubs or Organizations: No  . Attends Archivist Meetings: Never  . Marital Status: Never married  Intimate Partner Violence: Not At Risk  . Fear of Current or Ex-Partner: No  . Emotionally Abused: No  . Physically Abused: No  . Sexually Abused: No    Family History  Problem Relation Age of Onset  . Heart disease Mother   . Hypertension Mother   . Cancer - Other Mother   . Bipolar disorder Mother   . Heart disease Father   . Hypertension Father   . Diabetes Father   . Parkinson's disease Maternal Grandmother   . Cancer Maternal Aunt   . Cancer Maternal Uncle   . Cancer Maternal Grandfather   . Lupus Paternal Grandmother   . Hypertension  Brother      Current Outpatient Medications:  .  baclofen (LIORESAL) 20 MG tablet, Take 1 tablet (20 mg total) by mouth 3 (three) times daily as needed for muscle spasms., Disp: 90 each, Rfl: 2 .  Cranberry 500 MG TABS, Take by mouth., Disp: , Rfl:  .  DULoxetine (CYMBALTA) 60 MG capsule, Take 1 capsule (60 mg total) by mouth daily., Disp: 90 capsule, Rfl: 0 .  glucose blood (PRECISION QID TEST) test strip, Use once daily Use as instructed., Disp: , Rfl:  .  Lidocaine 0.5 % AERO, Apply topically., Disp: , Rfl:  .  lisinopril (ZESTRIL) 20 MG tablet, Take 1 tablet by mouth once daily, Disp: 90 tablet, Rfl: 0 .  Magnesium Bisglycinate (MAG GLYCINATE) 100 MG TABS, Take by mouth., Disp: , Rfl:  .  Melatonin 5 MG CAPS, Take 1-2 capsules (5-10 mg total) by mouth at bedtime., Disp: 100 capsule, Rfl: 0 .  metFORMIN (GLUCOPHAGE) 1000 MG tablet, Take 1 tablet by mouth 2 (two) times a day., Disp: , Rfl:  .  mirabegron ER (MYRBETRIQ) 50 MG TB24 tablet, Take 1 tablet (50 mg total) by mouth daily., Disp: 30 tablet, Rfl: 6 .  mycophenolate (CELLCEPT) 250 MG capsule, Take 1 capsule by mouth 2 (two) times a day., Disp: , Rfl:  .  oxyCODONE (OXYCONTIN) 10 mg 12 hr tablet, Take by mouth., Disp: , Rfl:  .  promethazine (PHENERGAN) 25 MG tablet, Take 1 tablet by mouth as needed., Disp: , Rfl:  .  QUEtiapine (SEROQUEL XR) 50 MG TB24 24 hr tablet, Take 1 tablet (50 mg total) by mouth at bedtime. To be combined with 150 mg, Disp: 90 tablet, Rfl: 0 .  QUEtiapine Fumarate (SEROQUEL XR) 150 MG 24 hr tablet, Take 1 tablet (150 mg total) by mouth at bedtime., Disp: 90 tablet, Rfl: 0 .  Specialty Vitamins Products (MAGNESIUM, AMINO ACID CHELATE,) 133 MG tablet, Take 1 tablet by mouth 2 (two) times daily., Disp: , Rfl:  .  SUMAtriptan (IMITREX) 100 MG tablet, TAKE 1 TABLET BY MOUTH ONCE AS NEEDED FOR MIGRAINE. MAY TAKE A SECOND DOSE AFTER 2 HOURS IF NEEDED, Disp: , Rfl:  .  tacrolimus (PROGRAF) 1 MG capsule, Take 1 capsule by  mouth once daily, Disp: 30 capsule, Rfl: 0 .  benztropine (COGENTIN) 0.5 MG tablet, Take 1 tablet (0.5 mg total) by mouth daily as needed for tremors., Disp: 30 tablet, Rfl: 1 .  hydrOXYzine (VISTARIL) 25 MG capsule, Take 1 capsule (25 mg total) by mouth 2 (two) times daily as needed.  For sever anxiety attacks, Disp: 60 capsule, Rfl: 1 .  liraglutide (VICTOZA) 18 MG/3ML SOPN, Inject into the skin., Disp: , Rfl:  .  pregabalin (LYRICA) 200 MG capsule, Take 1 capsule (200 mg total) by mouth 3 (three) times daily., Disp: 90 capsule, Rfl: 2  No results found.  No images are attached to the encounter.   CMP Latest Ref Rng & Units 01/13/2020  Glucose 70 - 99 mg/dL 177(H)  BUN 6 - 20 mg/dL 18  Creatinine 0.44 - 1.00 mg/dL 1.12(H)  Sodium 135 - 145 mmol/L 138  Potassium 3.5 - 5.1 mmol/L 5.0  Chloride 98 - 111 mmol/L 100  CO2 22 - 32 mmol/L 25  Calcium 8.9 - 10.3 mg/dL 9.7  Total Protein 6.5 - 8.1 g/dL 7.9  Total Bilirubin 0.3 - 1.2 mg/dL 0.8  Alkaline Phos 38 - 126 U/L 79  AST 15 - 41 U/L 93(H)  ALT 0 - 44 U/L 62(H)   CBC Latest Ref Rng & Units 01/13/2020  WBC 4.0 - 10.5 K/uL 5.9  Hemoglobin 12.0 - 15.0 g/dL 14.1  Hematocrit 36.0 - 46.0 % 42.6  Platelets 150 - 400 K/uL 160     Observation/objective: Appears in no acute distress of a video visit today.  Breathing is nonlabored  Assessment and plan: Patient is a 44 year old female with stage II extranodal marginal zone lymphoma involving bilateral parotid gland.  She s/p 4 cycles of weekly Rituxan chemotherapy.  This is a routine follow-up visit  PET CT scan after 4 cycles of Rituxan showed resolution of SUV uptake in the bilateral parotid lesions with complete response to therapy.  Clinically patient is doing well and no concerning symptoms of recurrence.  I will see her in about 3 to 4 weeks for in person comprehensive lymph node exam  Patient noted to have abnormal LFTs with a elevated AST at 93 and ALT at 62 respectively.  She will  follow up with [facility] transplant hepatology for this  Follow-up instructions: As above  I discussed the assessment and treatment plan with the patient. The patient was provided an opportunity to ask questions and all were answered. The patient agreed with the plan and demonstrated an understanding of the instructions.   The patient was advised to call back or seek an in-person evaluation if the symptoms worsen or if the condition fails to improve as anticipated.   Visit Diagnosis: 1. 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[place])   2. Abnormal LFTs     Dr. Randa Evens, MD, MPH [facility] at [facility] Tel- [phone] 01/18/2020 9:33 AM

## 2020-01-19 ENCOUNTER — Encounter: Payer: Self-pay | Admitting: Oncology

## 2020-01-20 DIAGNOSIS — Z79899 Other long term (current) drug therapy: Secondary | ICD-10-CM | POA: Diagnosis not present

## 2020-01-20 DIAGNOSIS — Z1159 Encounter for screening for other viral diseases: Secondary | ICD-10-CM | POA: Diagnosis not present

## 2020-01-20 DIAGNOSIS — B258 Other cytomegaloviral diseases: Secondary | ICD-10-CM | POA: Diagnosis not present

## 2020-01-20 DIAGNOSIS — Z944 Liver transplant status: Secondary | ICD-10-CM | POA: Diagnosis not present

## 2020-01-20 NOTE — Telephone Encounter (Signed)
Next week is fine

## 2020-01-24 ENCOUNTER — Ambulatory Visit: Payer: Medicaid Other | Attending: Internal Medicine

## 2020-01-24 DIAGNOSIS — Z23 Encounter for immunization: Secondary | ICD-10-CM

## 2020-01-24 NOTE — Progress Notes (Signed)
   Covid-19 Vaccination Clinic  Name:  Alison [name]    MRN: [id number] DOB: 21-Nov-1975  01/24/2020  Alison [name] was observed post Covid-19 immunization for 15 minutes without incident. She was provided with Vaccine Information Sheet and instruction to access the V-Safe system.   Alison [name] was instructed to call 911 with any severe reactions post vaccine: [name] Difficulty breathing  . Swelling of face and throat  . A fast heartbeat  . A bad rash all over body  . Dizziness and weakness   Immunizations Administered    Name Date Dose VIS Date Route   Pfizer COVID-19 Vaccine 01/24/2020  3:39 PM 0.3 mL 12/01/2018 Intramuscular   Manufacturer: [place]   Lot: [id number]   [place]: [id number]

## 2020-01-28 ENCOUNTER — Encounter: Payer: Self-pay | Admitting: Oncology

## 2020-01-28 ENCOUNTER — Inpatient Hospital Stay (HOSPITAL_BASED_OUTPATIENT_CLINIC_OR_DEPARTMENT_OTHER): Payer: Medicaid Other | Admitting: Oncology

## 2020-01-28 ENCOUNTER — Other Ambulatory Visit: Payer: Self-pay

## 2020-01-28 VITALS — BP 136/86 | HR 97 | Temp 97.8°F | Ht 67.0 in | Wt 275.0 lb

## 2020-01-28 DIAGNOSIS — E1122 Type 2 diabetes mellitus with diabetic chronic kidney disease: Secondary | ICD-10-CM | POA: Diagnosis not present

## 2020-01-28 DIAGNOSIS — I129 Hypertensive chronic kidney disease with stage 1 through stage 4 chronic kidney disease, or unspecified chronic kidney disease: Secondary | ICD-10-CM | POA: Diagnosis not present

## 2020-01-28 DIAGNOSIS — C884 Extranodal marginal zone B-cell lymphoma of mucosa-associated lymphoid tissue [MALT-lymphoma]: Secondary | ICD-10-CM | POA: Diagnosis not present

## 2020-01-28 DIAGNOSIS — Z79899 Other long term (current) drug therapy: Secondary | ICD-10-CM | POA: Diagnosis not present

## 2020-01-28 DIAGNOSIS — M479 Spondylosis, unspecified: Secondary | ICD-10-CM | POA: Diagnosis not present

## 2020-01-28 DIAGNOSIS — Z9221 Personal history of antineoplastic chemotherapy: Secondary | ICD-10-CM | POA: Diagnosis not present

## 2020-01-28 DIAGNOSIS — Z944 Liver transplant status: Secondary | ICD-10-CM | POA: Diagnosis not present

## 2020-01-28 DIAGNOSIS — Z7984 Long term (current) use of oral hypoglycemic drugs: Secondary | ICD-10-CM | POA: Diagnosis not present

## 2020-01-28 DIAGNOSIS — R945 Abnormal results of liver function studies: Secondary | ICD-10-CM | POA: Diagnosis not present

## 2020-01-28 DIAGNOSIS — Z8619 Personal history of other infectious and parasitic diseases: Secondary | ICD-10-CM | POA: Diagnosis not present

## 2020-01-28 DIAGNOSIS — R5383 Other fatigue: Secondary | ICD-10-CM | POA: Diagnosis not present

## 2020-01-28 DIAGNOSIS — N183 Chronic kidney disease, stage 3 unspecified: Secondary | ICD-10-CM | POA: Diagnosis not present

## 2020-01-28 DIAGNOSIS — R5381 Other malaise: Secondary | ICD-10-CM | POA: Diagnosis not present

## 2020-01-28 NOTE — Progress Notes (Signed)
Patient stated that she has had left neck pain for a week. Patient does not recall sleeping wrong. Patient fell due to a box on her living room. Patient did not hurt herself or broke any bones.

## 2020-01-29 ENCOUNTER — Other Ambulatory Visit: Payer: Self-pay | Admitting: Family Medicine

## 2020-01-29 ENCOUNTER — Other Ambulatory Visit: Payer: Self-pay | Admitting: Psychiatry

## 2020-01-29 DIAGNOSIS — F4310 Post-traumatic stress disorder, unspecified: Secondary | ICD-10-CM

## 2020-01-29 DIAGNOSIS — E1165 Type 2 diabetes mellitus with hyperglycemia: Secondary | ICD-10-CM

## 2020-01-29 DIAGNOSIS — I10 Essential (primary) hypertension: Secondary | ICD-10-CM

## 2020-01-29 DIAGNOSIS — F3130 Bipolar disorder, current episode depressed, mild or moderate severity, unspecified: Secondary | ICD-10-CM

## 2020-01-29 NOTE — Telephone Encounter (Signed)
Requested Prescriptions  Pending Prescriptions Disp Refills  . lisinopril (ZESTRIL) 20 MG tablet [Pharmacy Med Name: Lisinopril 20 MG Oral Tablet] 90 tablet 0    Sig: Take 1 tablet by mouth once daily     Cardiovascular:  ACE Inhibitors Failed - 01/29/2020  7:37 PM      Failed - Cr in normal range and within 180 days    Creatinine, Ser  Date Value Ref Range Status  01/13/2020 1.12 (H) 0.44 - 1.00 mg/dL Final         Passed - K in normal range and within 180 days    Potassium  Date Value Ref Range Status  01/13/2020 5.0 3.5 - 5.1 mmol/L Final         Passed - Patient is not pregnant      Passed - Last BP in normal range    BP Readings from Last 1 Encounters:  01/28/20 136/86         Passed - Valid encounter within last 6 months    Recent Outpatient Visits          4 months ago Neuropathy   [facility] [name], FNP   6 months ago UTI symptoms   [place], FNP   7 months ago Frequency of urination   [place], FNP   8 months ago Abnormal vaginal bleeding in postmenopausal patient   [place], FNP   9 months ago [place], Astrid Divine, [place]

## 2020-01-31 NOTE — Progress Notes (Signed)
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 She is currently on CellCept and tacrolimus for the same. She also has a history of post transplant lymphoproliferative disorder/DLBCL that was diagnosed in 2016. She is s/p 6 cycles of R-CHOP chemotherapy back then and was in complete remission 1. Her other past medical history significant for migraines, stage III chronic kidney disease, chemo-induced peripheral neuropathy, hypertension among other medical problems. With regards to her diffuse large B-cell lymphoma she was getting surveillance scans and this was all done in [place]. She has now moved to [facility] to be with her significant other.Patient was noted to have a prior kidney mass before which was biopsied and was not consistent with malignancy.  She underwent CT neck with contrast before she left [place] on 02/22/2019. She was noted to have soft tissue masses in both the parotid glands which were new as compared to prior exams and possibly represent lymph nodes. The largest one was seen in the left parotid gland measuring 1.3 x 1.1 cm. Before she could get a work-up for this patient moved to [place] and has not had any further work-up yet  Her other prior imaging is as follows: MRI thoracic  spine without contrast in August 2019 showed moderate multilevel spondylosis but no evidence of lymphoma. Multinodular thyroid in June 2019. MRI cervical spine May 2019 again showed spondylosis but no other acute pathology. MRI brain showed incidental partial opacification of the right and right sinus. No evidence of malignancy. I do not have any other PET CT scan or treatment records from the past.  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 Bone marrow biopsywas negative for lymphoma.  Patient completed 4 cycles of weekly RituxanIn September 2020.  Repeat PET CT scan showed interval resolution of the parotid lesions compatible with complete response to therapy   Interval history-patient reports mild pain along her left occasionally.  Denies other complaints at this time.  Appetite and weight are stable.  Denies any drenching night sweats  ECOG PS- 1 Pain scale- 4   Review of systems- Review of Systems  Constitutional: Negative for chills, fever, malaise/fatigue and weight loss.  HENT: Negative for congestion, ear discharge and nosebleeds.        Occasional left jaw pain  Eyes: Negative for blurred vision.  Respiratory: Negative for cough, hemoptysis, sputum production, shortness of breath and wheezing.   Cardiovascular: Negative for chest pain, palpitations, orthopnea and claudication.  Gastrointestinal: Negative for abdominal pain, blood in stool, constipation, diarrhea, heartburn, melena, nausea and vomiting.  Genitourinary: Negative for dysuria, flank pain, frequency, hematuria and urgency.  Musculoskeletal: Negative for back pain, joint pain and myalgias.  Skin: Negative for rash.  Neurological: Negative for dizziness, tingling, focal weakness, seizures, weakness and headaches.  Endo/Heme/Allergies: Does not bruise/bleed easily.  Psychiatric/Behavioral: Negative for depression and suicidal ideas. The patient does not have  insomnia.        Allergies  Allergen Reactions  . Penicillin G Itching, Rash, Anaphylaxis and Palpitations  . Carbamazepine Other (See Comments)    Medication interaction-prograf  . Hydrocodone-Acetaminophen Itching  . Naproxen Itching     Past Medical History:  Diagnosis Date  . Abnormal uterine bleeding   . Allergy   . Anxiety   . Arthritis   . Bipolar disorder (manic depression) ([place])   . Chronic kidney failure   . Chronic renal disease, stage III   . Diabetes mellitus without complication ([place])   . 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[place]) 2015   Right axillary lymph node resected and chemo tx's.  . FH: trigeminal neuralgia   . GERD (gastroesophageal reflux disease)   . Heart murmur   . Hypertension   . Kidney mass   . Lupus ([place])   . Lupus ([place])   . Major depressive disorder   . 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[place]) 06/2019   Chemo tx's  . Migraine   . Morbid obesity ([place])   . Neuromuscular disorder (H[facility])    neuropathy  . Neuropathy   . Personality disorder ([place])   . Post herpetic neuralgia   . PTSD (post-traumatic stress disorder)   . Renal disorder   . S/P liver transplant [facility])      Past Surgical History:  Procedure Laterality Date  . BONE MARROW BIOPSY  01/13/2015  . BREAST BIOPSY  12/2014  . BREAST BIOPSY  2011  . BREAST SURGERY    . CHOLECYSTECTOMY    . HERNIA REPAIR    . infusaport    . LIVER TRANSPLANT  12/17/1991  . LUMBAR PUNCTURE    . PORT A CATH INJECTION ([place] [facility])    . tumor removal  2015    Social History   Socioeconomic History  . Marital status: Single    Spouse name: Not on file  . Number of children: 0  . Years of education: 9  . Highest education level: GED or equivalent  Occupational History  . Occupation: disabled  Tobacco Use  . Smoking status: Never Smoker  . Smokeless tobacco: Never Used  Substance and Sexual Activity  . Alcohol use: Not Currently  . Drug use: Not Currently    Types: Marijuana    Comment: pain managment  last used in early april  . Sexual activity: Not Currently  Other Topics Concern  . Not on file  Social History Narrative  . Not on file   Social Determinants of Health   Financial Resource Strain: Medium Risk  . Difficulty of Paying Living Expenses: Somewhat hard  Food Insecurity: Food Insecurity Present  . Worried About Charity fundraiser in the Last Year: Sometimes true  . Ran Out of Food in the Last Year: Sometimes true  Transportation Needs: Unmet Transportation Needs  . Lack of Transportation (Medical): Yes  . Lack of Transportation (Non-Medical): Yes  Physical Activity: Insufficiently Active  . Days of Exercise per Week: 1 day  . Minutes of Exercise per Session: 30 min  Stress: Stress Concern Present  . Feeling of Stress : Rather much  Social Connections: Somewhat Isolated  . Frequency of Communication with Friends and Family: Once a week  . Frequency of Social Gatherings with Friends and Family: Three times a week  . Attends Religious Services: More than 4 times per year  . Active Member of Clubs or Organizations: No  . Attends Archivist Meetings: Never  .  Marital Status: Never married  Intimate Partner Violence: Not At Risk  . Fear of Current or Ex-Partner: No  . Emotionally Abused: No  . Physically Abused: No  . Sexually Abused: No    Family History  Problem Relation Age of Onset  . Heart disease Mother   . Hypertension Mother   . Cancer - Other Mother   . Bipolar disorder Mother   . Heart disease Father   . Hypertension Father   . Diabetes Father   . Parkinson's disease Maternal Grandmother   . Cancer Maternal Aunt   . Cancer Maternal Uncle   . Cancer Maternal Grandfather   . Lupus Paternal Grandmother   . Hypertension Brother      Current Outpatient Medications:  .  baclofen (LIORESAL) 20 MG tablet, Take 1 tablet (20 mg total) by mouth 3 (three) times daily as needed for muscle spasms., Disp: 90 each, Rfl: 2 .  benztropine (COGENTIN) 0.5  MG tablet, Take 1 tablet (0.5 mg total) by mouth daily as needed for tremors., Disp: 30 tablet, Rfl: 1 .  Cranberry 500 MG TABS, Take by mouth., Disp: , Rfl:  .  DULoxetine (CYMBALTA) 60 MG capsule, Take 1 capsule (60 mg total) by mouth daily., Disp: 90 capsule, Rfl: 0 .  glucose blood (PRECISION QID TEST) test strip, Use once daily Use as instructed., Disp: , Rfl:  .  hydrOXYzine (VISTARIL) 25 MG capsule, Take 1 capsule (25 mg total) by mouth 2 (two) times daily as needed. For sever anxiety attacks, Disp: 60 capsule, Rfl: 1 .  Lidocaine 0.5 % AERO, Apply topically., Disp: , Rfl:  .  liraglutide (VICTOZA) 18 MG/3ML SOPN, Inject into the skin., Disp: , Rfl:  .  Magnesium Bisglycinate (MAG GLYCINATE) 100 MG TABS, Take by mouth., Disp: , Rfl:  .  Melatonin 5 MG CAPS, Take 1-2 capsules (5-10 mg total) by mouth at bedtime., Disp: 100 capsule, Rfl: 0 .  metFORMIN (GLUCOPHAGE) 1000 MG tablet, Take 1 tablet by mouth 2 (two) times a day., Disp: , Rfl:  .  mirabegron ER (MYRBETRIQ) 50 MG TB24 tablet, Take 1 tablet (50 mg total) by mouth daily., Disp: 30 tablet, Rfl: 6 .  mycophenolate (CELLCEPT) 250 MG capsule, Take 1 capsule by mouth 2 (two) times a day., Disp: , Rfl:  .  oxyCODONE (OXYCONTIN) 10 mg 12 hr tablet, Take by mouth., Disp: , Rfl:  .  pregabalin (LYRICA) 200 MG capsule, Take 1 capsule (200 mg total) by mouth 3 (three) times daily., Disp: 90 capsule, Rfl: 2 .  promethazine (PHENERGAN) 25 MG tablet, Take 1 tablet by mouth as needed., Disp: , Rfl:  .  QUEtiapine (SEROQUEL XR) 50 MG TB24 24 hr tablet, Take 1 tablet (50 mg total) by mouth at bedtime. To be combined with 150 mg, Disp: 90 tablet, Rfl: 0 .  QUEtiapine Fumarate (SEROQUEL XR) 150 MG 24 hr tablet, Take 1 tablet (150 mg total) by mouth at bedtime., Disp: 90 tablet, Rfl: 0 .  Specialty Vitamins Products (MAGNESIUM, AMINO ACID CHELATE,) 133 MG tablet, Take 1 tablet by mouth 2 (two) times daily., Disp: , Rfl:  .  SUMAtriptan (IMITREX) 100 MG  tablet, TAKE 1 TABLET BY MOUTH ONCE AS NEEDED FOR MIGRAINE. MAY TAKE A SECOND DOSE AFTER 2 HOURS IF NEEDED, Disp: , Rfl:  .  tacrolimus (PROGRAF) 1 MG capsule, Take 1 capsule by mouth once daily, Disp: 30 capsule, Rfl: 0 .  lisinopril (ZESTRIL) 20 MG tablet, Take 1 tablet by mouth  once daily, Disp: 90 tablet, Rfl: 0  Physical exam:  Vitals:   01/28/20 1003  BP: 136/86  Pulse: 97  Temp: 97.8 F (36.6 C)  TempSrc: Tympanic  Weight: 275 lb (124.7 kg)  Height: 5' 7"  (1.702 m)   Physical Exam Constitutional:      General: She is not in acute distress. HENT:     Mouth/Throat:     Mouth: Mucous membranes are moist.     Pharynx: Oropharynx is clear.     Comments: No palpable swelling/ deformity along the jaw line Cardiovascular:     Rate and Rhythm: Normal rate and regular rhythm.     Heart sounds: Normal heart sounds.  Pulmonary:     Effort: Pulmonary effort is normal.     Breath sounds: Normal breath sounds.  Abdominal:     General: Bowel sounds are normal.     Palpations: Abdomen is soft.  Lymphadenopathy:     Comments: No palpable cervical, supraclavicular, axillary or inguinal adenopathy   Skin:    General: Skin is warm and dry.  Neurological:     Mental Status: She is alert and oriented to person, place, and time.      CMP Latest Ref Rng & Units 01/13/2020  Glucose 70 - 99 mg/dL 177(H)  BUN 6 - 20 mg/dL 18  Creatinine 0.44 - 1.00 mg/dL 1.12(H)  Sodium 135 - 145 mmol/L 138  Potassium 3.5 - 5.1 mmol/L 5.0  Chloride 98 - 111 mmol/L 100  CO2 22 - 32 mmol/L 25  Calcium 8.9 - 10.3 mg/dL 9.7  Total Protein 6.5 - 8.1 g/dL 7.9  Total Bilirubin 0.3 - 1.2 mg/dL 0.8  Alkaline Phos 38 - 126 U/L 79  AST 15 - 41 U/L 93(H)  ALT 0 - 44 U/L 62(H)   CBC Latest Ref Rng & Units 01/13/2020  WBC 4.0 - 10.5 K/uL 5.9  Hemoglobin 12.0 - 15.0 g/dL 14.1  Hematocrit 36.0 - 46.0 % 42.6  Platelets 150 - 400 K/uL 160     Assessment and plan- Patient is a 44 y.o. 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 She is s/p 4 weekly cycles of Rituxan chemotherapy. This is an in person visit for physical exam and assessment of jaw pain  1.  Clinically patient seems to be doing well with no concerning signs and symptoms of recurrence based on today's exam.  Specifically I do not palpate any swelling deformity or lymph nodes along the cervical chain or elsewhere.  If her pain persists I will consider getting a CT maxillofacial at that time her blood counts are normal.  There would be no role for routine imaging at this time.  Patient will let me know if her pain is getting worse  2.  Patient noted to have abnormal LFTs during her last visit.  Repeat levels checked at [facility] show improvement.  She is following up with them given her history of liver transplant.   Visit Diagnosis 1. 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 Randa Evens, MD, MPH [facility] at [facility] [phone] 01/31/2020 8:33 AM

## 2020-02-01 DIAGNOSIS — Z5181 Encounter for therapeutic drug level monitoring: Secondary | ICD-10-CM | POA: Diagnosis not present

## 2020-02-01 DIAGNOSIS — Z944 Liver transplant status: Secondary | ICD-10-CM | POA: Diagnosis not present

## 2020-02-02 DIAGNOSIS — F320 Major depressive disorder, single episode, mild: Secondary | ICD-10-CM | POA: Diagnosis not present

## 2020-02-02 DIAGNOSIS — G893 Neoplasm related pain (acute) (chronic): Secondary | ICD-10-CM | POA: Diagnosis not present

## 2020-02-02 DIAGNOSIS — G894 Chronic pain syndrome: Secondary | ICD-10-CM | POA: Diagnosis not present

## 2020-02-03 DIAGNOSIS — J90 Pleural effusion, not elsewhere classified: Secondary | ICD-10-CM | POA: Diagnosis not present

## 2020-02-03 DIAGNOSIS — Z944 Liver transplant status: Secondary | ICD-10-CM | POA: Diagnosis not present

## 2020-02-03 DIAGNOSIS — R162 Hepatomegaly with splenomegaly, not elsewhere classified: Secondary | ICD-10-CM | POA: Diagnosis not present

## 2020-02-03 DIAGNOSIS — Z4823 Encounter for aftercare following liver transplant: Secondary | ICD-10-CM | POA: Diagnosis not present

## 2020-02-07 ENCOUNTER — Encounter: Payer: Self-pay | Admitting: Family Medicine

## 2020-02-07 DIAGNOSIS — Z944 Liver transplant status: Secondary | ICD-10-CM | POA: Diagnosis not present

## 2020-02-10 DIAGNOSIS — Z6841 Body Mass Index (BMI) 40.0 and over, adult: Secondary | ICD-10-CM | POA: Diagnosis not present

## 2020-02-11 ENCOUNTER — Encounter: Payer: Self-pay | Admitting: Family Medicine

## 2020-02-11 ENCOUNTER — Telehealth (INDEPENDENT_AMBULATORY_CARE_PROVIDER_SITE_OTHER): Payer: Medicaid Other | Admitting: Psychiatry

## 2020-02-11 ENCOUNTER — Other Ambulatory Visit: Payer: Self-pay

## 2020-02-11 ENCOUNTER — Encounter: Payer: Self-pay | Admitting: Psychiatry

## 2020-02-11 DIAGNOSIS — F4310 Post-traumatic stress disorder, unspecified: Secondary | ICD-10-CM

## 2020-02-11 DIAGNOSIS — G2111 Neuroleptic induced parkinsonism: Secondary | ICD-10-CM

## 2020-02-11 DIAGNOSIS — F3175 Bipolar disorder, in partial remission, most recent episode depressed: Secondary | ICD-10-CM | POA: Diagnosis not present

## 2020-02-11 DIAGNOSIS — M109 Gout, unspecified: Secondary | ICD-10-CM | POA: Diagnosis not present

## 2020-02-11 NOTE — Progress Notes (Signed)
Provider Location : [facility] Patient Location : Home  Virtual Visit via Telephone Note  I connected with Alison [name] on 02/11/20 at  9:20 AM EDT by telephone and verified that I am speaking with the correct person using two identifiers.   I discussed the limitations, risks, security and privacy concerns of performing an evaluation and management service by telephone and the availability of in person appointments. I also discussed with the patient that there may be a patient responsible charge related to this service. The patient expressed understanding and agreed to proceed.     I discussed the assessment and treatment plan with the patient. The patient was provided an opportunity to ask questions and all were answered. The patient agreed with the plan and demonstrated an understanding of the instructions.   The patient was advised to call back or seek an in-person evaluation if the symptoms worsen or if the condition fails to improve as anticipated.     [place] MD OP Progress Note  02/11/2020 12:54 PM Alison [name]  MRN:  [id number]  Chief Complaint:  Chief Complaint    Follow-up     HPI: Alison [name] is a 44 year old Caucasian female, lives in [place], was evaluated by telemedicine today.  Patient has a history of bipolar disorder, PTSD.  Patient today reports she is currently struggling with flareup of gout.  She is currently in a lot of pain.  She is trying to medicate herself by making use of her pain medications.  She however reports she will also reach out to her primary care provider.  She otherwise reports her mood symptoms as getting better.  She denies any significant mood swings.  She reports sleep is good.  She reports appetite is fair.  She reports she is enjoying her pet birds and is currently trying to teach them to talk.  Patient denies any side effects to her medications and is compliant on them.  She denies any suicidality, homicidality or perceptual  disturbances.  Patient denies any other concerns today. Visit Diagnosis:    ICD-10-CM   1. Bipolar disorder, in partial remission, most recent episode depressed ([place])  F31.75   2. PTSD (post-traumatic stress disorder)  F43.10    improving  3. 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 Abnormal uterine bleeding   . Allergy   . Anxiety   . Arthritis   . Bipolar disorder (manic depression) ([place])   . Chronic kidney failure   . Chronic renal disease, stage III   . Diabetes mellitus without complication ([place])   . 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  . FH: trigeminal neuralgia   . GERD (gastroesophageal reflux disease)   . Heart murmur   . Hypertension   . Kidney mass   . Lupus ([place])   . Lupus ([place])   . Major depressive disorder   . 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 Migraine   . Morbid obesity ([place])   . Neuromuscular disorder ([facility])    neuropathy  . Neuropathy   . Personality disorder ([place])   . Post herpetic neuralgia   . PTSD (post-traumatic stress disorder)   . Renal disorder   . S/P liver transplant [facility])     Past Surgical History:  Procedure Laterality Date  . BONE MARROW BIOPSY  01/13/2015  . BREAST BIOPSY  12/2014  . BREAST BIOPSY  2011  . BREAST SURGERY    . CHOLECYSTECTOMY    . HERNIA REPAIR    . infusaport    . LIVER TRANSPLANT  12/17/1991  . LUMBAR PUNCTURE    . PORT A CATH INJECTION ([place] HX)    . tumor removal  2015    Family Psychiatric History: Reviewed family psychiatric history from my progress note on 06/10/2019  Family History:  Family History  Problem Relation Age of Onset  . Heart disease Mother   . Hypertension Mother   . Cancer - Other Mother   . Bipolar disorder Mother   .  Heart disease Father   . Hypertension Father   . Diabetes Father   . Parkinson's disease Maternal Grandmother   . Cancer Maternal Aunt   . Cancer Maternal Uncle   . Cancer Maternal Grandfather   . Lupus Paternal Grandmother   . Hypertension Brother     Social History: Reviewed social history from my progress note on 06/10/2019 Social History   Socioeconomic History  . Marital status: Single    Spouse name: Not on file  . Number of children: 0  . Years of education: 9  . Highest education level: GED or equivalent  Occupational History  . Occupation: disabled  Tobacco Use  . Smoking status: Never Smoker  . Smokeless tobacco: Never Used  Substance and Sexual Activity  . Alcohol use: Not Currently  . Drug use: Not Currently    Types: Marijuana    Comment: pain managment last used in early april  . Sexual activity: Not Currently  Other Topics Concern  . Not on file  Social History Narrative  . Not on file   Social Determinants of Health   Financial Resource Strain: Medium Risk  . Difficulty of Paying Living Expenses: Somewhat hard  Food Insecurity: Food Insecurity Present  . Worried About Charity fundraiser in the Last Year: Sometimes true  . Ran Out of Food in the Last Year: Sometimes true  Transportation Needs: Unmet Transportation Needs  . Lack of Transportation (Medical): Yes  . Lack of Transportation (Non-Medical): Yes  Physical Activity: Insufficiently Active  . Days of Exercise per Week: 1 day  . Minutes of Exercise per Session: 30 min  Stress: Stress Concern Present  . Feeling of Stress : Rather much  Social Connections: Somewhat Isolated  . Frequency of Communication with Friends and Family: Once a week  . Frequency of Social Gatherings with Friends and Family: Three times a week  . Attends Religious Services: More than 4 times per year  . Active Member of Clubs or Organizations: No  . Attends Archivist Meetings: Never  . Marital Status: Never  married    Allergies:  Allergies  Allergen Reactions  . Penicillin G Itching, Rash, Anaphylaxis and Palpitations  . Carbamazepine Other (See Comments)    Medication interaction-prograf  . Hydrocodone-Acetaminophen Itching  . Naproxen Itching    Metabolic Disorder Labs: Lab Results  Component Value Date   HGBA1C 6.6 (H) 07/15/2019   Lab Results  Component Value Date   PROLACTIN 13.4 07/15/2019   PROLACTIN 21.3 05/24/2019   No results found for: CHOL, TRIG, HDL, CHOLHDL, VLDL, LDLCALC Lab Results  Component Value Date   TSH 2.650 07/15/2019   TSH 4.650 (H) 05/24/2019    Therapeutic Level Labs: No results found for: LITHIUM No results  found for: VALPROATE No components found for:  CBMZ  Current Medications: Current Outpatient Medications  Medication Sig Dispense Refill  . baclofen (LIORESAL) 20 MG tablet Take 1 tablet (20 mg total) by mouth 3 (three) times daily as needed for muscle spasms. 90 each 2  . benztropine (COGENTIN) 0.5 MG tablet Take 1 tablet (0.5 mg total) by mouth daily as needed for tremors. 30 tablet 1  . Cranberry 500 MG TABS Take by mouth.    . DULoxetine (CYMBALTA) 60 MG capsule Take 1 capsule by mouth once daily 90 capsule 0  . glucose blood (PRECISION QID TEST) test strip Use once daily Use as instructed.    . hydrOXYzine (VISTARIL) 25 MG capsule Take 1 capsule (25 mg total) by mouth 2 (two) times daily as needed. For sever anxiety attacks 60 capsule 1  . Lidocaine 0.5 % AERO Apply topically.    . liraglutide (VICTOZA) 18 MG/3ML SOPN Inject into the skin.    [name] lisinopril (ZESTRIL) 20 MG tablet Take 1 tablet by mouth once daily 90 tablet 0  . Magnesium Bisglycinate (MAG GLYCINATE) 100 MG TABS Take by mouth.    . magnesium oxide (MAG-OX) 400 MG tablet Take by mouth.    . Melatonin 5 MG CAPS Take 1-2 capsules (5-10 mg total) by mouth at bedtime. 100 capsule 0  . metFORMIN (GLUCOPHAGE) 1000 MG tablet Take 1 tablet by mouth 2 (two) times a day.    .  mirabegron ER (MYRBETRIQ) 50 MG TB24 tablet Take 1 tablet (50 mg total) by mouth daily. 30 tablet 6  . mycophenolate (CELLCEPT) 250 MG capsule Take 1 capsule by mouth 2 (two) times a day.    [name] oxycodone (OXY-IR) 5 MG capsule Take by mouth.    . oxyCODONE (OXYCONTIN) 10 mg 12 hr tablet Take by mouth.    . pregabalin (LYRICA) 200 MG capsule Take 1 capsule (200 mg total) by mouth 3 (three) times daily. 90 capsule 2  . promethazine (PHENERGAN) 25 MG tablet Take 1 tablet by mouth as needed.    [name] QUEtiapine (SEROQUEL XR) 50 MG TB24 24 hr tablet Take 1 tablet (50 mg total) by mouth at bedtime. To be combined with 150 mg 90 tablet 0  . QUEtiapine Fumarate (SEROQUEL XR) 150 MG 24 hr tablet Take 1 tablet (150 mg total) by mouth at bedtime. 90 tablet 0  . Specialty Vitamins Products (MAGNESIUM, AMINO ACID CHELATE,) 133 MG tablet Take 1 tablet by mouth 2 (two) times daily.    . SUMAtriptan (IMITREX) 100 MG tablet TAKE 1 TABLET BY MOUTH ONCE AS NEEDED FOR MIGRAINE. MAY TAKE A SECOND DOSE AFTER 2 HOURS IF NEEDED    . tacrolimus (PROGRAF) 1 MG capsule Take 1 capsule by mouth once daily 30 capsule 0   No current facility-administered medications for this visit.     Musculoskeletal: Strength & Muscle Tone: UTA Gait & Station: UTA Patient leans: N/A  Psychiatric Specialty Exam: Review of Systems  Musculoskeletal: Positive for joint swelling (Pain-left sided toe).  Psychiatric/Behavioral: Negative for agitation, behavioral problems, confusion, decreased concentration, dysphoric mood, hallucinations, self-injury, sleep disturbance and suicidal ideas. The patient is not nervous/anxious and is not hyperactive.   All other systems reviewed and are negative.   Last menstrual period 02/04/2018.There is no height or weight on file to calculate BMI.  General Appearance: UTA  Eye Contact:  UTA  Speech:  Clear and Coherent  Volume:  Normal  Mood:  Euthymic  Affect:  UTA  Thought  Process:  Goal Directed and  Descriptions of Associations: Intact  Orientation:  Full (Time, Place, and Person)  Thought Content: Logical   Suicidal Thoughts:  No  Homicidal Thoughts:  No  Memory:  Immediate;   Fair Recent;   Fair Remote;   Fair  Judgement:  Fair  Insight:  Fair  Psychomotor Activity:  UTA  Concentration:  Concentration: Fair and Attention Span: Fair  Recall:  [employer] of Knowledge: Fair  Language: Fair  Akathisia:  No  Handed:  Right  AIMS (if indicated): UTA  Assets:  Communication Skills Desire for Improvement Housing Social Support  ADL's:  Intact  Cognition: WNL  Sleep:  Fair   Screenings: PHQ2-9     Nutrition from 09/23/2019 in [facility] Office Visit from 09/16/2019 in [facility] Management from 08/10/2019 in [facility] Office Visit from 07/30/2019 in [facility] Office Visit from 06/17/2019 in [facility]  PHQ-2 Total Score  6  2  4   0  4  PHQ-9 Total Score  19  5  5   0  16       Assessment and Plan: Alison [name] is a 44 year old Caucasian female on disability, has a history of bipolar disorder, PTSD, history of borderline personality disorder, chronic pain, fibromyalgia, trigeminal neuralgia, history of liver transplant, B-cell lymphoma, SLE, diabetes melitis, hypertension, OSA, migraine headaches was evaluated by telemedicine today.  Patient with psychosocial stressors of her current health issues and the pandemic.  She is however making progress.  Plan as noted below.  Plan Bipolar disorder in remission Seroquel extended release 200 mg p.o. nightly Cogentin 0.5 mg p.o. daily as needed Cymbalta 60 mg p.o. daily  PTSD-stable Cymbalta as prescribed Hydroxyzine 25 mg p.o. twice daily as needed for anxiety attacks Melatonin 5 to 10 mg p.o. nightly Continue CBT with her therapist in [place]  Neuroleptic induced Parkinson's disease-stable Cogentin 0.5 mg daily  as needed  Follow-up in clinic in 6 weeks or sooner if needed.  I have spent atleast 20 minutes non face to face with patient today. More than 50 % of the time was spent for preparing to see the patient ( e.g., review of test, records ), ordering medications and test ,psychoeducation and supportive psychotherapy and care coordination,as well as documenting clinical information in electronic health record. This note was generated in part or whole with voice recognition software. Voice recognition is usually quite accurate but there are transcription errors that can and very often do occur. I apologize for any typographical errors that were not detected and corrected.      [name], MD 02/11/2020, 12:54 PM

## 2020-02-12 ENCOUNTER — Encounter: Payer: Self-pay | Admitting: Family Medicine

## 2020-02-15 ENCOUNTER — Ambulatory Visit: Payer: Medicaid Other | Admitting: Oncology

## 2020-02-17 DIAGNOSIS — F3181 Bipolar II disorder: Secondary | ICD-10-CM | POA: Diagnosis not present

## 2020-02-24 DIAGNOSIS — F3181 Bipolar II disorder: Secondary | ICD-10-CM | POA: Diagnosis not present

## 2020-02-29 ENCOUNTER — Ambulatory Visit: Payer: Medicaid Other | Admitting: Gastroenterology

## 2020-03-02 DIAGNOSIS — F3181 Bipolar II disorder: Secondary | ICD-10-CM | POA: Diagnosis not present

## 2020-03-08 DIAGNOSIS — R162 Hepatomegaly with splenomegaly, not elsewhere classified: Secondary | ICD-10-CM | POA: Diagnosis not present

## 2020-03-08 DIAGNOSIS — Z944 Liver transplant status: Secondary | ICD-10-CM | POA: Diagnosis not present

## 2020-03-08 DIAGNOSIS — F3181 Bipolar II disorder: Secondary | ICD-10-CM | POA: Diagnosis not present

## 2020-03-08 DIAGNOSIS — N281 Cyst of kidney, acquired: Secondary | ICD-10-CM | POA: Diagnosis not present

## 2020-03-08 DIAGNOSIS — R932 Abnormal findings on diagnostic imaging of liver and biliary tract: Secondary | ICD-10-CM | POA: Diagnosis not present

## 2020-03-09 DIAGNOSIS — D229 Melanocytic nevi, unspecified: Secondary | ICD-10-CM | POA: Diagnosis not present

## 2020-03-09 DIAGNOSIS — L218 Other seborrheic dermatitis: Secondary | ICD-10-CM | POA: Diagnosis not present

## 2020-03-09 DIAGNOSIS — D239 Other benign neoplasm of skin, unspecified: Secondary | ICD-10-CM | POA: Diagnosis not present

## 2020-03-09 DIAGNOSIS — L821 Other seborrheic keratosis: Secondary | ICD-10-CM | POA: Diagnosis not present

## 2020-03-15 DIAGNOSIS — G893 Neoplasm related pain (acute) (chronic): Secondary | ICD-10-CM | POA: Diagnosis not present

## 2020-03-15 DIAGNOSIS — G894 Chronic pain syndrome: Secondary | ICD-10-CM | POA: Diagnosis not present

## 2020-03-15 DIAGNOSIS — F320 Major depressive disorder, single episode, mild: Secondary | ICD-10-CM | POA: Diagnosis not present

## 2020-03-15 DIAGNOSIS — F3181 Bipolar II disorder: Secondary | ICD-10-CM | POA: Diagnosis not present

## 2020-03-16 ENCOUNTER — Other Ambulatory Visit: Payer: Self-pay | Admitting: Student in an Organized Health Care Education/Training Program

## 2020-03-16 DIAGNOSIS — G629 Polyneuropathy, unspecified: Secondary | ICD-10-CM

## 2020-03-16 DIAGNOSIS — G500 Trigeminal neuralgia: Secondary | ICD-10-CM

## 2020-03-16 DIAGNOSIS — M5116 Intervertebral disc disorders with radiculopathy, lumbar region: Secondary | ICD-10-CM

## 2020-03-16 DIAGNOSIS — M47816 Spondylosis without myelopathy or radiculopathy, lumbar region: Secondary | ICD-10-CM

## 2020-03-16 DIAGNOSIS — M797 Fibromyalgia: Secondary | ICD-10-CM

## 2020-03-23 DIAGNOSIS — G500 Trigeminal neuralgia: Secondary | ICD-10-CM | POA: Diagnosis not present

## 2020-03-23 DIAGNOSIS — F3181 Bipolar II disorder: Secondary | ICD-10-CM | POA: Diagnosis not present

## 2020-03-24 ENCOUNTER — Encounter: Payer: Self-pay | Admitting: Psychiatry

## 2020-03-24 ENCOUNTER — Ambulatory Visit (INDEPENDENT_AMBULATORY_CARE_PROVIDER_SITE_OTHER): Payer: Medicaid Other | Admitting: Vascular Surgery

## 2020-03-24 ENCOUNTER — Other Ambulatory Visit: Payer: Self-pay

## 2020-03-24 ENCOUNTER — Telehealth (INDEPENDENT_AMBULATORY_CARE_PROVIDER_SITE_OTHER): Payer: Medicaid Other | Admitting: Psychiatry

## 2020-03-24 DIAGNOSIS — F4310 Post-traumatic stress disorder, unspecified: Secondary | ICD-10-CM | POA: Diagnosis not present

## 2020-03-24 DIAGNOSIS — F3175 Bipolar disorder, in partial remission, most recent episode depressed: Secondary | ICD-10-CM

## 2020-03-24 DIAGNOSIS — G2111 Neuroleptic induced parkinsonism: Secondary | ICD-10-CM | POA: Diagnosis not present

## 2020-03-24 MED ORDER — QUETIAPINE FUMARATE ER 150 MG PO TB24: 150.0000 mg | ORAL_TABLET | Freq: Every day | ORAL | 0 refills | Status: DC

## 2020-03-24 MED ORDER — DULOXETINE HCL 60 MG PO CPEP: 60.0000 mg | ORAL_CAPSULE | Freq: Every day | ORAL | 0 refills | Status: DC

## 2020-03-24 MED ORDER — HYDROXYZINE PAMOATE 25 MG PO CAPS: 25.0000 mg | ORAL_CAPSULE | Freq: Two times a day (BID) | ORAL | 1 refills | Status: DC | PRN

## 2020-03-24 MED ORDER — DULOXETINE HCL 30 MG PO CPEP: 30.0000 mg | ORAL_CAPSULE | Freq: Every day | ORAL | 0 refills | Status: DC

## 2020-03-24 MED ORDER — QUETIAPINE FUMARATE ER 50 MG PO TB24: 50.0000 mg | ORAL_TABLET | Freq: Every day | ORAL | 0 refills | Status: DC

## 2020-03-24 NOTE — Progress Notes (Signed)
Provider Location : [facility] Patient Location : Home  Virtual Visit via Video Note  I connected with Cecilie Heidel on 03/24/20 at 10:40 AM EDT by a video enabled telemedicine application and verified that I am speaking with the correct person using two identifiers.   I discussed the limitations of evaluation and management by telemedicine and the availability of in person appointments. The patient expressed understanding and agreed to proceed.    I discussed the assessment and treatment plan with the patient. The patient was provided an opportunity to ask questions and all were answered. The patient agreed with the plan and demonstrated an understanding of the instructions.   The patient was advised to call back or seek an in-person evaluation if the symptoms worsen or if the condition fails to improve as anticipated.  [place] MD OP Progress Note  03/24/2020 12:41 PM Jalaina Salyers  MRN:  [id number]  Chief Complaint:  Chief Complaint    Follow-up     HPI: Walterine Amodei is a 44 year old Caucasian female, lives in [place] was evaluated by telemedicine today.  Patient has a history of bipolar disorder, PTSD.  Patient today reports she is currently making progress with regards to her mood symptoms.  She does have slight depressive symptoms on and off more so because Father's Day is coming up.  She reports she wants to spend time with her father however due to the COVID restriction she is unable to.  This does make her sad.  However she is overall coping okay.  She reports sleep is improved.  She is compliant on medications as prescribed.    She reports her muscle cramps have improved and she continues to use Cogentin as needed which helps.  She does report fibromyalgia pain, that does limit her functioning on and off.  She continues to be on Lyrica.  The Cymbalta also helps.  Patient denies any suicidality, homicidality or perceptual disturbances.  She continues to spend time with her  pet bird, she is teaching him a few words.  This does make her happy.  She also has been going for swimming at her apartment complex pool.  Patient denies any other concerns today.  Visit Diagnosis:    ICD-10-CM   1. Bipolar disorder, in partial remission, most recent episode depressed (HCC)  F31.75 QUEtiapine (SEROQUEL XR) 50 MG TB24 24 hr tablet  2. PTSD (post-traumatic stress disorder)  F43.10 DULoxetine (CYMBALTA) 60 MG capsule    DULoxetine (CYMBALTA) 30 MG capsule    hydrOXYzine (VISTARIL) 25 MG capsule  3. 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 Abnormal uterine bleeding   . Allergy   . Anxiety   . Arthritis   . Bipolar disorder (manic depression) ([place])   . Chronic kidney failure   . Chronic renal disease, stage III   . Diabetes mellitus without complication ([place])   . 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  . FH: trigeminal neuralgia   . GERD (gastroesophageal reflux disease)   . Heart murmur   . Hypertension   . Kidney mass   . Lupus ([place])   . Lupus ([place])   . Major depressive disorder   . 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 Migraine   . Morbid obesity ([place])   . Neuromuscular disorder (HCC)    neuropathy  . Neuropathy   . Personality disorder ([place])   . Post herpetic neuralgia   . PTSD (post-traumatic stress disorder)   . Renal disorder   . S/P liver transplant [facility])     Past Surgical History:  Procedure Laterality Date  . BONE MARROW BIOPSY  01/13/2015  . BREAST BIOPSY  12/2014  . BREAST BIOPSY  2011  . BREAST SURGERY    . CHOLECYSTECTOMY    . HERNIA REPAIR    . infusaport    . LIVER TRANSPLANT  12/17/1991  . LUMBAR PUNCTURE     . PORT A CATH INJECTION ([place] HX)    . tumor removal  2015    Family Psychiatric History: Reviewed family psychiatric history from my progress note on 06/10/2019  Family History:  Family History  Problem Relation Age of Onset  . Heart disease Mother   . Hypertension Mother   . Cancer - Other Mother   . Bipolar disorder Mother   . Heart disease Father   . Hypertension Father   . Diabetes Father   . Parkinson's disease Maternal Grandmother   . Cancer Maternal Aunt   . Cancer Maternal Uncle   . Cancer Maternal Grandfather   . Lupus Paternal Grandmother   . Hypertension Brother     Social History: Reviewed social history from my progress note on 06/10/2019 Social History   Socioeconomic History  . Marital status: Single    Spouse name: Not on file  . Number of children: 0  . Years of education: 9  . Highest education level: GED or equivalent  Occupational History  . Occupation: disabled  Tobacco Use  . Smoking status: Never Smoker  . Smokeless tobacco: Never Used  Vaping Use  . Vaping Use: Never used  Substance and Sexual Activity  . Alcohol use: Not Currently  . Drug use: Not Currently    Types: Marijuana    Comment: pain managment last used in early april  . Sexual activity: Not Currently  Other Topics Concern  . Not on file  Social History Narrative  . Not on file   Social Determinants of Health   Financial Resource Strain: Medium Risk  . Difficulty of Paying Living Expenses: Somewhat hard  Food Insecurity: Food Insecurity Present  . Worried About Charity fundraiser in the Last Year: Sometimes true  . Ran Out of Food in the Last Year: Sometimes true  Transportation Needs: Unmet Transportation Needs  . Lack of Transportation (Medical): Yes  . Lack of Transportation (Non-Medical): Yes  Physical Activity: Insufficiently Active  . Days of Exercise per Week: 1 day  . Minutes of Exercise per Session: 30 min  Stress: Stress Concern Present  . Feeling of Stress  : Rather much  Social Connections: Moderately Isolated  . Frequency of Communication with Friends and Family: Once a week  . Frequency of Social Gatherings with Friends and Family: Three times a week  . Attends Religious Services: More than 4 times per year  . Active Member of Clubs or Organizations: No  . Attends Archivist Meetings: Never  . Marital Status: Never married    Allergies:  Allergies  Allergen Reactions  . Penicillin G Itching, Rash, Anaphylaxis and Palpitations  . Carbamazepine Other (See Comments)    Medication interaction-prograf  . Hydrocodone-Acetaminophen Itching  . Naproxen Itching  Metabolic Disorder Labs: Lab Results  Component Value Date   HGBA1C 6.6 (H) 07/15/2019   Lab Results  Component Value Date   PROLACTIN 13.4 07/15/2019   PROLACTIN 21.3 05/24/2019   No results found for: CHOL, TRIG, HDL, CHOLHDL, VLDL, LDLCALC Lab Results  Component Value Date   TSH 2.650 07/15/2019   TSH 4.650 (H) 05/24/2019    Therapeutic Level Labs: No results found for: LITHIUM No results found for: VALPROATE No components found for:  CBMZ  Current Medications: Current Outpatient Medications  Medication Sig Dispense Refill  . baclofen (LIORESAL) 20 MG tablet Take 1 tablet (20 mg total) by mouth 3 (three) times daily as needed for muscle spasms. 90 each 2  . benztropine (COGENTIN) 0.5 MG tablet Take 1 tablet (0.5 mg total) by mouth daily as needed for tremors. 30 tablet 1  . Cranberry 500 MG TABS Take by mouth.    . DULoxetine (CYMBALTA) 30 MG capsule Take 1 capsule (30 mg total) by mouth daily. To be combined with 60 mg daily 90 capsule 0  . DULoxetine (CYMBALTA) 60 MG capsule Take 1 capsule (60 mg total) by mouth daily. To be combined with 30 mg daily 90 capsule 0  . glucose blood (PRECISION QID TEST) test strip Use once daily Use as instructed.    . hydrOXYzine (VISTARIL) 25 MG capsule Take 1 capsule (25 mg total) by mouth 2 (two) times daily as  needed. For sever anxiety attacks 60 capsule 1  . Lidocaine 0.5 % AERO Apply topically.    . liraglutide (VICTOZA) 18 MG/3ML SOPN Inject into the skin.    [name] lisinopril (ZESTRIL) 20 MG tablet Take 1 tablet by mouth once daily 90 tablet 0  . Magnesium Bisglycinate (MAG GLYCINATE) 100 MG TABS Take by mouth.    . magnesium oxide (MAG-OX) 400 MG tablet Take by mouth.    . Melatonin 5 MG CAPS Take 1-2 capsules (5-10 mg total) by mouth at bedtime. 100 capsule 0  . metFORMIN (GLUCOPHAGE) 1000 MG tablet Take 1 tablet by mouth 2 (two) times a day.    . mirabegron ER (MYRBETRIQ) 50 MG TB24 tablet Take 1 tablet (50 mg total) by mouth daily. 30 tablet 6  . mycophenolate (CELLCEPT) 250 MG capsule Take 1 capsule by mouth 2 (two) times a day.    [name] oxycodone (OXY-IR) 5 MG capsule Take by mouth.    . oxyCODONE (OXYCONTIN) 10 mg 12 hr tablet Take by mouth.    . pregabalin (LYRICA) 200 MG capsule Take 1 capsule (200 mg total) by mouth 3 (three) times daily. 90 capsule 2  . promethazine (PHENERGAN) 25 MG tablet Take 1 tablet by mouth as needed.    [name] QUEtiapine (SEROQUEL XR) 50 MG TB24 24 hr tablet Take 1 tablet (50 mg total) by mouth at bedtime. To be combined with 150 mg 90 tablet 0  . QUEtiapine Fumarate (SEROQUEL XR) 150 MG 24 hr tablet Take 1 tablet (150 mg total) by mouth at bedtime. To be combined with 50 mg at bedtime 90 tablet 0  . Specialty Vitamins Products (MAGNESIUM, AMINO ACID CHELATE,) 133 MG tablet Take 1 tablet by mouth 2 (two) times daily.    . SUMAtriptan (IMITREX) 100 MG tablet TAKE 1 TABLET BY MOUTH ONCE AS NEEDED FOR MIGRAINE. MAY TAKE A SECOND DOSE AFTER 2 HOURS IF NEEDED    . tacrolimus (PROGRAF) 1 MG capsule Take 1 capsule by mouth once daily 30 capsule 0   No current facility-administered medications  for this visit.     Musculoskeletal: Strength & Muscle Tone: UTA Gait & Station: normal Patient leans: N/A  Psychiatric Specialty Exam: Review of Systems  Musculoskeletal: Positive for  myalgias.       Has fibromyalgia per history  Psychiatric/Behavioral: Positive for dysphoric mood.    Last menstrual period 02/04/2018.There is no height or weight on file to calculate BMI.  General Appearance: Casual  Eye Contact:  Fair  Speech:  Clear and Coherent  Volume:  Normal  Mood:  Depressed  Affect:  Congruent  Thought Process:  Goal Directed and Descriptions of Associations: Intact  Orientation:  Full (Time, Place, and Person)  Thought Content: Logical   Suicidal Thoughts:  No  Homicidal Thoughts:  No  Memory:  Immediate;   Fair Recent;   Fair Remote;   Fair  Judgement:  Fair  Insight:  Fair  Psychomotor Activity:  Normal  Concentration:  Concentration: Fair and Attention Span: Fair  Recall:  [employer] of Knowledge: Fair  Language: Fair  Akathisia:  No  Handed:  Right  AIMS (if indicated): UTA  Assets:  Communication Skills Desire for Improvement Housing Social Support  ADL's:  Intact  Cognition: WNL  Sleep:  Fair   Screenings: PHQ2-9     Nutrition from 09/23/2019 in [place] Office Visit from 09/16/2019 in [place] Management from 08/10/2019 in [facility] Office Visit from 07/30/2019 in [facility] Office Visit from 06/17/2019 in [facility]  PHQ-2 Total Score _0 0 4  PHQ-9 Total Score _1 0 16       Assessment and Plan: Theresia Pree is a 44 year old Caucasian female on disability, has a history of bipolar disorder, PTSD, history of borderline personality disorder, chronic pain, fibromyalgia, trigeminal neuralgia, history of liver transplant, B-cell lymphoma, SLE, diabetes melitis, hypertension, OSA, migraine headaches was evaluated by telemedicine today.  Patient with psychosocial stressors of her current health issues and the pandemic.  Patient however is currently making progress however will continue to benefit from medication  readjustment.  Plan as noted below.  Plan Bipolar disorder in partial remission. Seroquel extended release 200 mg p.o. nightly. Increase Cymbalta to 90 mg p.o. daily which will help with her mood and also fibromyalgia.  PTSD-stable Cymbalta as prescribed Hydroxyzine 25 mg p.o. twice daily as needed for anxiety attacks Melatonin 5 to 10 mg p.o. nightly Continue CBT with therapist in [place]  Neuroleptic induced Parkinson's disease-stable Cogentin 0.5 mg p.o. daily as needed  Follow-up in clinic in 6 to 8 weeks or sooner if needed.  I have spent atleast 20 minutes non face to face with patient today. More than 50 % of the time was spent for preparing to see the patient ( e.g., review of test, records ), ordering medications and test ,psychoeducation and supportive psychotherapy and care coordination,as well as documenting clinical information in electronic health record. This note was generated in part or whole with voice recognition software. Voice recognition is usually quite accurate but there are transcription errors that can and very often do occur. I apologize for any typographical errors that were not detected and corrected.        [name], MD 03/24/2020, 12:41 PM

## 2020-03-30 DIAGNOSIS — F3181 Bipolar II disorder: Secondary | ICD-10-CM | POA: Diagnosis not present

## 2020-04-03 DIAGNOSIS — F3181 Bipolar II disorder: Secondary | ICD-10-CM | POA: Diagnosis not present

## 2020-04-05 DIAGNOSIS — Z20822 Contact with and (suspected) exposure to covid-19: Secondary | ICD-10-CM | POA: Diagnosis not present

## 2020-04-05 DIAGNOSIS — M109 Gout, unspecified: Secondary | ICD-10-CM | POA: Diagnosis not present

## 2020-04-18 ENCOUNTER — Encounter (INDEPENDENT_AMBULATORY_CARE_PROVIDER_SITE_OTHER): Payer: Self-pay | Admitting: Vascular Surgery

## 2020-04-18 ENCOUNTER — Ambulatory Visit (INDEPENDENT_AMBULATORY_CARE_PROVIDER_SITE_OTHER): Payer: Medicaid Other | Admitting: Vascular Surgery

## 2020-04-18 ENCOUNTER — Other Ambulatory Visit: Payer: Self-pay

## 2020-04-18 VITALS — BP 134/82 | HR 83 | Ht 66.0 in | Wt 268.0 lb

## 2020-04-18 DIAGNOSIS — N183 Chronic kidney disease, stage 3 unspecified: Secondary | ICD-10-CM

## 2020-04-18 DIAGNOSIS — I10 Essential (primary) hypertension: Secondary | ICD-10-CM

## 2020-04-18 DIAGNOSIS — R600 Localized edema: Secondary | ICD-10-CM | POA: Diagnosis not present

## 2020-04-18 DIAGNOSIS — E1165 Type 2 diabetes mellitus with hyperglycemia: Secondary | ICD-10-CM | POA: Diagnosis not present

## 2020-04-18 DIAGNOSIS — C8580 Other specified types of non-Hodgkin lymphoma, unspecified site: Secondary | ICD-10-CM | POA: Diagnosis not present

## 2020-04-18 NOTE — Assessment & Plan Note (Signed)
Symptoms are currently doing much better.  She will continue with compression, elevation, and staying active.  I have offered her an annual follow-up versus as needed and she would prefer the latter.

## 2020-04-18 NOTE — Progress Notes (Signed)
MRN : [id number]  Alison [name] is a 44 y.o. (04/10/1976) female who presents with chief complaint of  Chief Complaint  Patient presents with  . Follow-up    6 mo follow up  .  History of Present Illness: Patient returns today in follow up of her leg swelling.  She is doing well and currently her leg swelling is quite mild.  She has been elevating her legs and being a little more active.  Those are the major changes since her last visit.  On examination today, there is really no swelling in either lower extremity appreciable.  She says she does occasionally get some in the evenings but has been much better.  No new ulceration or infection.  No fever or chills  Current Outpatient Medications  Medication Sig Dispense Refill  . baclofen (LIORESAL) 20 MG tablet Take 1 tablet (20 mg total) by mouth 3 (three) times daily as needed for muscle spasms. 90 each 2  . baclofen (LIORESAL) 20 MG tablet Take by mouth.    . benztropine (COGENTIN) 0.5 MG tablet Take 1 tablet (0.5 mg total) by mouth daily as needed for tremors. 30 tablet 1  . Cranberry 500 MG TABS Take by mouth.    . DULoxetine (CYMBALTA) 30 MG capsule Take 1 capsule (30 mg total) by mouth daily. To be combined with 60 mg daily 90 capsule 0  . DULoxetine (CYMBALTA) 60 MG capsule Take 1 capsule (60 mg total) by mouth daily. To be combined with 30 mg daily 90 capsule 0  . glucose blood (PRECISION QID TEST) test strip Use once daily Use as instructed.    . hydrOXYzine (VISTARIL) 25 MG capsule Take 1 capsule (25 mg total) by mouth 2 (two) times daily as needed. For sever anxiety attacks 60 capsule 1  . Lidocaine 0.5 % AERO Apply topically.    . liraglutide (VICTOZA) 18 MG/3ML SOPN Inject into the skin.    [name] lisinopril (ZESTRIL) 20 MG tablet Take 1 tablet by mouth once daily 90 tablet 0  . Magnesium Bisglycinate (MAG GLYCINATE) 100 MG TABS Take by mouth.    . magnesium oxide (MAG-OX) 400 MG tablet Take by mouth.    . Melatonin 5 MG CAPS  Take 1-2 capsules (5-10 mg total) by mouth at bedtime. 100 capsule 0  . metFORMIN (GLUCOPHAGE) 1000 MG tablet Take 1 tablet by mouth 2 (two) times a day.    . mirabegron ER (MYRBETRIQ) 50 MG TB24 tablet Take 1 tablet (50 mg total) by mouth daily. 30 tablet 6  . mycophenolate (CELLCEPT) 250 MG capsule Take 1 capsule by mouth 2 (two) times a day.    [name] oxycodone (OXY-IR) 5 MG capsule Take by mouth.    . oxyCODONE (OXYCONTIN) 10 mg 12 hr tablet Take by mouth.    . promethazine (PHENERGAN) 25 MG tablet Take 1 tablet by mouth as needed.    [name] QUEtiapine (SEROQUEL XR) 50 MG TB24 24 hr tablet Take 1 tablet (50 mg total) by mouth at bedtime. To be combined with 150 mg 90 tablet 0  . QUEtiapine Fumarate (SEROQUEL XR) 150 MG 24 hr tablet Take 1 tablet (150 mg total) by mouth at bedtime. To be combined with 50 mg at bedtime 90 tablet 0  . Specialty Vitamins Products (MAGNESIUM, AMINO ACID CHELATE,) 133 MG tablet Take 1 tablet by mouth 2 (two) times daily.    . SUMAtriptan (IMITREX) 100 MG tablet TAKE 1 TABLET BY MOUTH ONCE AS NEEDED FOR MIGRAINE.  MAY TAKE A SECOND DOSE AFTER 2 HOURS IF NEEDED    . tacrolimus (PROGRAF) 1 MG capsule Take 1 capsule by mouth once daily 30 capsule 0  . pregabalin (LYRICA) 200 MG capsule Take 1 capsule (200 mg total) by mouth 3 (three) times daily. 90 capsule 2   No current facility-administered medications for this visit.    Past Medical History:  Diagnosis Date  . Abnormal uterine bleeding   . Allergy   . Anxiety   . Arthritis   . Bipolar disorder (manic depression) ([place])   . Chronic kidney failure   . Chronic renal disease, stage III   . Diabetes mellitus without complication ([place])   . 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  . FH: trigeminal neuralgia   . GERD (gastroesophageal reflux disease)   . Heart murmur   . Hypertension   . Kidney mass   . Lupus ([place])   . Lupus ([place])   . Major depressive  disorder   . 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 Migraine   . Morbid obesity ([place])   . Neuromuscular disorder (HCC)    neuropathy  . Neuropathy   . Personality disorder ([place])   . Post herpetic neuralgia   . PTSD (post-traumatic stress disorder)   . Renal disorder   . S/P liver transplant [facility])     Past Surgical History:  Procedure Laterality Date  . BONE MARROW BIOPSY  01/13/2015  . BREAST BIOPSY  12/2014  . BREAST BIOPSY  2011  . BREAST SURGERY    . CHOLECYSTECTOMY    . HERNIA REPAIR    . infusaport    . LIVER TRANSPLANT  12/17/1991  . LUMBAR PUNCTURE    . PORT A CATH INJECTION ([place] HX)    . tumor removal  2015     Social History   Tobacco Use  . Smoking status: Never Smoker  . Smokeless tobacco: Never Used  Vaping Use  . Vaping Use: Never used  Substance Use Topics  . Alcohol use: Not Currently  . Drug use: Not Currently    Types: Marijuana    Comment: pain managment last used in early april      Family History  Problem Relation Age of Onset  . Heart disease Mother   . Hypertension Mother   . Cancer - Other Mother   . Bipolar disorder Mother   . Heart disease Father   . Hypertension Father   . Diabetes Father   . Parkinson's disease Maternal Grandmother   . Cancer Maternal Aunt   . Cancer Maternal Uncle   . Cancer Maternal Grandfather   . Lupus Paternal Grandmother   . Hypertension Brother     Allergies  Allergen Reactions  . Penicillin G Itching, Rash, Anaphylaxis and Palpitations  . Carbamazepine Other (See Comments)    Medication interaction-prograf  . Hydrocodone-Acetaminophen Itching  . Naproxen Itching     REVIEW OF SYSTEMS (Negative unless checked) Constitutional: [] ?Weight loss[] ?Fever[] ?Chills Cardiac:[] ?Chest pain[] ? Atrial Fibrillation[] ?Palpitations [] ?Shortness of breath when laying flat [] ?Shortness of breath with exertion. [] ?Shortness of breath at rest Vascular: [x] ?Pain in legs with  walking[] ?Pain in legswith standing[] ?Pain in legs when laying flat [] ?Claudication  [] ?Pain in feet when laying flat [] ?History of DVT [] ?Phlebitis [x] ?Swelling in legs [x] ?Varicose veins [] ?Non-healing ulcers Pulmonary: [] ?Uses home oxygen [] ?Productive cough[] ?Hemoptysis [] ?Wheeze [] ?COPD [] ?Asthma Neurologic: [] ?Dizziness[] ?Seizures [] ?Blackouts[] ?History of stroke [] ?History of TIA[] ?Aphasia [] ?Temporary Blindness[] ?Weaknessor numbness  in arm [] ?Weakness or numbnessin leg Musculoskeletal:[] ?Joint swelling [] ?Joint pain [] ?Low back pain  [] ? History of Knee Replacement [x] ?Arthritis [] ?back Surgeries[] ? Spinal Stenosis  Hematologic:[] ?Easy bruising[] ?Easy bleeding [] ?Hypercoagulable state [] ?Anemic Gastrointestinal:[] ?Diarrhea [] ?Vomiting[] ?Gastroesophageal reflux/heartburn[] ?Difficulty swallowing. [] ?Abdominal pain Genitourinary: [] ?Chronic kidney disease [] ?Difficulturination [] ?Anuric[] ?Blood in urine [] ?Frequenturination [] ?Burning with urination[] ?Hematuria Skin: [] ?Rashes [] ?Ulcers [] ?Wounds Psychological: [] ?History of anxiety[] ?History of major depression  [] ? Memory Difficulties   Physical Examination  BP 134/82   Pulse 83   Ht 5' 6"  (1.676 m)   Wt 268 lb (121.6 kg)   LMP 02/04/2018 (Approximate) Comment: Per patient, over a year ago  BMI 43.26 kg/m  Gen:  WD/WN, NAD Head: [place]/AT, No temporalis wasting. Ear/Nose/Throat: Hearing grossly intact, nares w/o erythema or drainage Eyes: Conjunctiva clear. Sclera non-icteric Neck: Supple.  Trachea midline Pulmonary:  Good air movement, no use of accessory muscles.  Cardiac: RRR, no JVD Vascular:  Vessel Right Left  Radial Palpable Palpable                          PT Palpable Palpable  DP Palpable Palpable   Gastrointestinal: soft, non-tender/non-distended. No guarding/reflex.  Musculoskeletal: M/S 5/5 throughout.  No  deformity or atrophy.  No significant lower extremity edema. Neurologic: Sensation grossly intact in extremities.  Symmetrical.  Speech is fluent.  Psychiatric: Judgment intact, Mood & affect appropriate for pt's clinical situation. Dermatologic: No rashes or ulcers noted.  No cellulitis or open wounds.       Labs No results found for this or any previous visit (from the past 2160 hour(s)).  Radiology No results found.  Assessment/Plan  Essential hypertension blood pressure control important in reducing the progression of atherosclerotic disease. On appropriate oral medications.   Type 2 diabetes mellitus, uncontrolled (HCC) blood glucose control important in reducing the progression of atherosclerotic disease. Also, involved in wound healing. On appropriate medications.   Chronic renal disease, stage III (HCC) Can certainly worsen lower extremity swelling. Must also be cautious with diuretics.  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HCC) Lymphatic malignancy such as this would markedly increase her risk of refractory lymphedema.    Leotis Pain, MD  04/18/2020 4:30 PM    This note was created with Dragon medical transcription system.  Any errors from dictation are purely unintentional

## 2020-04-18 NOTE — Assessment & Plan Note (Signed)
Lymphatic malignancy such as this would markedly increase her risk of refractory lymphedema.

## 2020-04-18 NOTE — Assessment & Plan Note (Signed)
blood glucose control important in reducing the progression of atherosclerotic disease. Also, involved in wound healing. On appropriate medications.  

## 2020-04-18 NOTE — Assessment & Plan Note (Signed)
blood pressure control important in reducing the progression of atherosclerotic disease. On appropriate oral medications.  

## 2020-04-18 NOTE — Assessment & Plan Note (Signed)
Can certainly worsen lower extremity swelling. Must also be cautious with diuretics.

## 2020-04-19 DIAGNOSIS — M5414 Radiculopathy, thoracic region: Secondary | ICD-10-CM | POA: Diagnosis not present

## 2020-04-19 DIAGNOSIS — M5442 Lumbago with sciatica, left side: Secondary | ICD-10-CM | POA: Diagnosis not present

## 2020-04-19 DIAGNOSIS — F320 Major depressive disorder, single episode, mild: Secondary | ICD-10-CM | POA: Diagnosis not present

## 2020-04-19 DIAGNOSIS — G894 Chronic pain syndrome: Secondary | ICD-10-CM | POA: Diagnosis not present

## 2020-04-19 DIAGNOSIS — G893 Neoplasm related pain (acute) (chronic): Secondary | ICD-10-CM | POA: Diagnosis not present

## 2020-04-19 DIAGNOSIS — I10 Essential (primary) hypertension: Secondary | ICD-10-CM | POA: Diagnosis not present

## 2020-04-19 DIAGNOSIS — F3189 Other bipolar disorder: Secondary | ICD-10-CM | POA: Diagnosis not present

## 2020-04-20 ENCOUNTER — Other Ambulatory Visit: Payer: Self-pay

## 2020-04-20 ENCOUNTER — Encounter: Payer: Self-pay | Admitting: Physician Assistant

## 2020-04-20 ENCOUNTER — Ambulatory Visit (INDEPENDENT_AMBULATORY_CARE_PROVIDER_SITE_OTHER): Payer: Medicaid Other | Admitting: Physician Assistant

## 2020-04-20 VITALS — BP 164/101 | HR 79 | Ht 66.0 in | Wt 271.0 lb

## 2020-04-20 DIAGNOSIS — N390 Urinary tract infection, site not specified: Secondary | ICD-10-CM | POA: Diagnosis not present

## 2020-04-20 MED ORDER — CIPROFLOXACIN HCL 250 MG PO TABS: 250.0000 mg | ORAL_TABLET | Freq: Two times a day (BID) | ORAL | 0 refills | Status: AC

## 2020-04-20 NOTE — Progress Notes (Signed)
04/20/2020 4:48 PM   Alison [name] 10-26-1975 [id number]  CC: Chief Complaint  Patient presents with  . Recurrent UTI    HPI: Alison [name] is a 44 y.o. female with PMH renal mass, recurrent UTI, and OAB on Myrbetriq 50 mg daily who presents today for evaluation of possible UTI.   Today, she reports an approximate 2-week history of frequency, urgency, and dysuria that has become progressively worse.  She denies fever, chills, nausea, vomiting, and flank pain.  In-office UA today positive for trace-lysed blood, 1+ protein, and 2+ leukocyte esterase; urine microscopy with >30 WBCs/HPF and many bacteria.   PMH: Past Medical History:  Diagnosis Date  . Abnormal uterine bleeding   . Allergy   . Anxiety   . Arthritis   . Bipolar disorder (manic depression) ([place])   . Chronic kidney failure   . Chronic renal disease, stage III   . Diabetes mellitus without complication ([place])   . 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  . FH: trigeminal neuralgia   . GERD (gastroesophageal reflux disease)   . Heart murmur   . Hypertension   . Kidney mass   . Lupus ([place])   . Lupus ([place])   . Major depressive disorder   . 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 Migraine   . Morbid obesity ([place])   . Neuromuscular disorder (HCC)    neuropathy  . Neuropathy   . Personality disorder ([place])   . Post herpetic neuralgia   . PTSD (post-traumatic stress disorder)   . Renal disorder   . S/P liver transplant [facility])     Surgical History: Past Surgical History:  Procedure Laterality Date  . BONE MARROW BIOPSY  01/13/2015  . BREAST BIOPSY  12/2014  . BREAST BIOPSY  2011  . BREAST SURGERY    . CHOLECYSTECTOMY    . HERNIA REPAIR    . infusaport    . LIVER TRANSPLANT  12/17/1991  . LUMBAR PUNCTURE    . PORT A CATH INJECTION ([place] [facility])    . tumor removal  2015    Home Medications:  Allergies as of 04/20/2020       Reactions   Penicillin G Itching, Rash, Anaphylaxis, Palpitations   Carbamazepine Other (See Comments)   Medication interaction-prograf   Hydrocodone-acetaminophen Itching   Naproxen Itching      Medication List       Accurate as of April 20, 2020  4:48 PM. If you have any questions, ask your nurse or doctor.        baclofen 20 MG tablet Commonly known as: LIORESAL Take 1 tablet (20 mg total) by mouth 3 (three) times daily as needed for muscle spasms.   baclofen 20 MG tablet Commonly known as: LIORESAL Take by mouth.   benztropine 0.5 MG tablet Commonly known as: COGENTIN Take 1 tablet (0.5 mg total) by mouth daily as needed for tremors.   ciprofloxacin 250 MG tablet Commonly known as: CIPRO Take 1 tablet (250 mg total) by mouth 2 (two) times daily for 5 days. Started by: Debroah Loop, PA-C   colchicine 0.6 MG tablet Take 0.6 mg by mouth 2 (two) times daily.   Cranberry 500 MG Tabs Take by mouth.   DULoxetine 60 MG capsule Commonly known as: CYMBALTA Take 1 capsule (60 mg total) by mouth daily. To be combined with 30 mg daily   DULoxetine 30 MG  capsule Commonly known as: Cymbalta Take 1 capsule (30 mg total) by mouth daily. To be combined with 60 mg daily   hydrOXYzine 25 MG capsule Commonly known as: Vistaril Take 1 capsule (25 mg total) by mouth 2 (two) times daily as needed. For sever anxiety attacks   Lidocaine 0.5 % Aero Apply topically.   liraglutide 18 MG/3ML Sopn Commonly known as: VICTOZA Inject into the skin.   lisinopril 20 MG tablet Commonly known as: ZESTRIL Take 1 tablet by mouth once daily   Mag Glycinate 100 MG Tabs Take by mouth.   magnesium (amino acid chelate) 133 MG tablet Take 1 tablet by mouth 2 (two) times daily.   magnesium oxide 400 MG tablet Commonly known as: MAG-OX Take by mouth.   Melatonin 5 MG Caps Take 1-2 capsules (5-10 mg total) by mouth at bedtime.   metFORMIN 1000 MG tablet Commonly known as:  GLUCOPHAGE Take 1 tablet by mouth 2 (two) times a day.   mirabegron ER 50 MG Tb24 tablet Commonly known as: Myrbetriq Take 1 tablet (50 mg total) by mouth daily.   mycophenolate 250 MG capsule Commonly known as: CELLCEPT Take 1 capsule by mouth 2 (two) times a day.   oxycodone 5 MG capsule Commonly known as: OXY-IR Take by mouth.   oxyCODONE 10 mg 12 hr tablet Commonly known as: OXYCONTIN Take by mouth.   Precision QID Test test strip Generic drug: glucose blood Use once daily Use as instructed.   pregabalin 200 MG capsule Commonly known as: LYRICA Take 1 capsule (200 mg total) by mouth 3 (three) times daily.   promethazine 25 MG tablet Commonly known as: PHENERGAN Take 1 tablet by mouth as needed.   QUEtiapine 50 MG Tb24 24 hr tablet Commonly known as: SEROquel XR Take 1 tablet (50 mg total) by mouth at bedtime. To be combined with 150 mg   QUEtiapine Fumarate 150 MG 24 hr tablet Commonly known as: SEROquel XR Take 1 tablet (150 mg total) by mouth at bedtime. To be combined with 50 mg at bedtime   SUMAtriptan 100 MG tablet Commonly known as: IMITREX TAKE 1 TABLET BY MOUTH ONCE AS NEEDED FOR MIGRAINE. MAY TAKE A SECOND DOSE AFTER 2 HOURS IF NEEDED   tacrolimus 1 MG capsule Commonly known as: PROGRAF Take 1 capsule by mouth once daily       Allergies:  Allergies  Allergen Reactions  . Penicillin G Itching, Rash, Anaphylaxis and Palpitations  . Carbamazepine Other (See Comments)    Medication interaction-prograf  . Hydrocodone-Acetaminophen Itching  . Naproxen Itching    Family History: Family History  Problem Relation Age of Onset  . Heart disease Mother   . Hypertension Mother   . Cancer - Other Mother   . Bipolar disorder Mother   . Heart disease Father   . Hypertension Father   . Diabetes Father   . Parkinson's disease Maternal Grandmother   . Cancer Maternal Aunt   . Cancer Maternal Uncle   . Cancer Maternal Grandfather   . Lupus Paternal  Grandmother   . Hypertension Brother     Social History:   reports that she has never smoked. She has never used smokeless tobacco. She reports previous alcohol use. She reports previous drug use. Drug: Marijuana.  Physical Exam: BP (!) 164/101 (BP Location: Left Arm, Patient Position: Sitting, Cuff Size: Normal)   Pulse 79   Ht _0  (1.676 m)   Wt 271 lb (122.9 kg)   LMP 02/04/2018 (  Approximate) Comment: Per patient, over a year ago  BMI 43.74 kg/m   Constitutional:  Alert and oriented, no acute distress, nontoxic appearing HEENT: [place], AT Cardiovascular: No clubbing, cyanosis, or edema Respiratory: Normal respiratory effort, no increased work of breathing Skin: No rashes, bruises or suspicious lesions Neurologic: Grossly intact, no focal deficits, moving all 4 extremities Psychiatric: Normal mood and affect  Laboratory Data: Results for orders placed or performed in visit on 04/20/20  Microscopic Examination   Urine  Result Value Ref Range   WBC, UA >30 (A) 0 - 5 /hpf   RBC 0-2 0 - 2 /hpf   Epithelial Cells (non renal) 0-10 0 - 10 /hpf   Bacteria, UA Many (A) None seen/Few  Urinalysis, Complete  Result Value Ref Range   Specific Gravity, UA 1.020 1.005 - 1.030   pH, UA 7.0 5.0 - 7.5   Color, UA Yellow Yellow   Appearance Ur Cloudy (A) Clear   Leukocytes,UA 2+ (A) Negative   Protein,UA 1+ (A) Negative/Trace   Glucose, UA Negative Negative   Ketones, UA Negative Negative   RBC, UA Trace (A) Negative   Bilirubin, UA Negative Negative   Urobilinogen, Ur 0.2 0.2 - 1.0 mg/dL   Nitrite, UA Negative Negative   Microscopic Examination See below:    Assessment & Plan:   1. Recurrent UTI 2-week history of dysuria, urine consistent with infection.  Will send for culture for further evaluation and start empiric Cipro.  Counseled patient that I would contact her if her urine culture results indicate a necessary change in therapy.  She expressed understanding. - Urinalysis,  Complete - CULTURE, URINE COMPREHENSIVE - ciprofloxacin (CIPRO) 250 MG tablet; Take 1 tablet (250 mg total) by mouth 2 (two) times daily for 5 days.  Dispense: 10 tablet; Refill: 0  Return if symptoms worsen or fail to improve.  Debroah Loop, PA-C  [facility] [address] [place], [place] [address] [phone]

## 2020-04-21 LAB — MICROSCOPIC EXAMINATION: WBC, UA: >30 /hpf — AB (ref 0–5)

## 2020-04-21 LAB — URINALYSIS, COMPLETE
Bilirubin, UA: NEGATIVE
Glucose, UA: NEGATIVE
Ketones, UA: NEGATIVE
Nitrite, UA: NEGATIVE
Specific Gravity, UA: 1.02 (ref 1.005–1.030)
Urobilinogen, Ur: 0.2 mg/dL (ref 0.2–1.0)
pH, UA: 7 (ref 5.0–7.5)

## 2020-04-24 LAB — CULTURE, URINE COMPREHENSIVE

## 2020-04-25 ENCOUNTER — Telehealth: Payer: Self-pay | Admitting: Physician Assistant

## 2020-04-25 NOTE — Telephone Encounter (Signed)
Please contact the patient and inform her that her recent urine culture came back negative.  She did not have a UTI.  If she remains symptomatic, I recommend cystoscopy with Dr. Erlene Quan for further evaluation.

## 2020-04-27 NOTE — Telephone Encounter (Signed)
Notified patient as advised. Patient denies any symptoms at this time. Patient declined scheduling cystoscopy at this time.

## 2020-05-10 ENCOUNTER — Telehealth: Payer: Self-pay

## 2020-05-10 NOTE — Telephone Encounter (Signed)
received fax from [facility] that quetipine er 172m was approved from  05-10-20 to 05-10-21

## 2020-05-16 DIAGNOSIS — F3181 Bipolar II disorder: Secondary | ICD-10-CM | POA: Diagnosis not present

## 2020-05-16 DIAGNOSIS — F4310 Post-traumatic stress disorder, unspecified: Secondary | ICD-10-CM | POA: Diagnosis not present

## 2020-05-17 DIAGNOSIS — L720 Epidermal cyst: Secondary | ICD-10-CM | POA: Diagnosis not present

## 2020-05-17 DIAGNOSIS — F3189 Other bipolar disorder: Secondary | ICD-10-CM | POA: Diagnosis not present

## 2020-05-17 DIAGNOSIS — G894 Chronic pain syndrome: Secondary | ICD-10-CM | POA: Diagnosis not present

## 2020-05-17 DIAGNOSIS — M5414 Radiculopathy, thoracic region: Secondary | ICD-10-CM | POA: Diagnosis not present

## 2020-05-17 DIAGNOSIS — L578 Other skin changes due to chronic exposure to nonionizing radiation: Secondary | ICD-10-CM | POA: Diagnosis not present

## 2020-05-17 DIAGNOSIS — L570 Actinic keratosis: Secondary | ICD-10-CM | POA: Diagnosis not present

## 2020-05-17 DIAGNOSIS — M5442 Lumbago with sciatica, left side: Secondary | ICD-10-CM | POA: Diagnosis not present

## 2020-05-17 DIAGNOSIS — G893 Neoplasm related pain (acute) (chronic): Secondary | ICD-10-CM | POA: Diagnosis not present

## 2020-05-17 DIAGNOSIS — L821 Other seborrheic keratosis: Secondary | ICD-10-CM | POA: Diagnosis not present

## 2020-05-17 DIAGNOSIS — L218 Other seborrheic dermatitis: Secondary | ICD-10-CM | POA: Diagnosis not present

## 2020-05-17 DIAGNOSIS — F320 Major depressive disorder, single episode, mild: Secondary | ICD-10-CM | POA: Diagnosis not present

## 2020-05-17 DIAGNOSIS — I10 Essential (primary) hypertension: Secondary | ICD-10-CM | POA: Diagnosis not present

## 2020-05-29 ENCOUNTER — Encounter: Payer: Self-pay | Admitting: Psychiatry

## 2020-05-29 ENCOUNTER — Telehealth (INDEPENDENT_AMBULATORY_CARE_PROVIDER_SITE_OTHER): Payer: Medicaid Other | Admitting: Psychiatry

## 2020-05-29 ENCOUNTER — Other Ambulatory Visit: Payer: Self-pay

## 2020-05-29 DIAGNOSIS — F319 Bipolar disorder, unspecified: Secondary | ICD-10-CM | POA: Diagnosis not present

## 2020-05-29 DIAGNOSIS — G2111 Neuroleptic induced parkinsonism: Secondary | ICD-10-CM | POA: Diagnosis not present

## 2020-05-29 DIAGNOSIS — F4310 Post-traumatic stress disorder, unspecified: Secondary | ICD-10-CM

## 2020-05-29 MED ORDER — DULOXETINE HCL 30 MG PO CPEP: 30.0000 mg | ORAL_CAPSULE | Freq: Every day | ORAL | 0 refills | Status: DC

## 2020-05-29 MED ORDER — DULOXETINE HCL 60 MG PO CPEP: 60.0000 mg | ORAL_CAPSULE | Freq: Every day | ORAL | 0 refills | Status: DC

## 2020-05-29 MED ORDER — QUETIAPINE FUMARATE ER 300 MG PO TB24: 300.0000 mg | ORAL_TABLET | Freq: Every day | ORAL | 0 refills | Status: DC

## 2020-05-29 NOTE — Progress Notes (Signed)
Provider Location : [facility] Patient Location : Home  Participants: Patient , Provider  Virtual Visit via Video Note  I connected with Alison [name] on 05/29/20 at 10:00 AM EDT by a video enabled telemedicine application and verified that I am speaking with the correct person using two identifiers.   I discussed the limitations of evaluation and management by telemedicine and the availability of in person appointments. The patient expressed understanding and agreed to proceed.    I discussed the assessment and treatment plan with the patient. The patient was provided an opportunity to ask questions and all were answered. The patient agreed with the plan and demonstrated an understanding of the instructions.   The patient was advised to call back or seek an in-person evaluation if the symptoms worsen or if the condition fails to improve as anticipated.   June Park MD  OP Progress Note  05/29/2020 3:41 PM Alison [name]  MRN:  [id number]  Chief Complaint:  Chief Complaint    Follow-up     HPI: Alison [name] is a 44 year old Caucasian female, lives in [place], was evaluated by telemedicine today.  Patient has a history of bipolar disorder, PTSD and multiple medical problems.  Patient today reports she has been feeling depressed lately.  She reports sadness, lack of motivation, sleep problems and reduced appetite.  This has been getting worse since the past few days.  She reports it all started after she realized that she cannot go for the convention this year.  She reports the convention is there however due to the delta variant of COVID-19 virus she does not want to put herself at risk.  She reports she is currently trying to figure out what she can do to get out of the house and get a break.  She reports her fianc continues to be supportive and they may plan to go to the beach at some point.  Patient reports she is compliant on all medications.  Patient denies side effects.  Patient  does report mild tremors on and off both upper extremities however reports the Cogentin as needed helps.  She continues to have fibromyalgia pain however reports she is currently on medications which helps.  The Cymbalta also helps with her mood and fibromyalgia.  Patient denies any suicidality, homicidality or perceptual disturbances.  Patient denies any other concerns today. Visit Diagnosis:    ICD-10-CM   1. Bipolar 1 disorder, depressed (HCC)  F31.9 QUEtiapine (SEROQUEL XR) 300 MG 24 hr tablet   Mild  2. PTSD (post-traumatic stress disorder)  F43.10 QUEtiapine (SEROQUEL XR) 300 MG 24 hr tablet    DULoxetine (CYMBALTA) 60 MG capsule    DULoxetine (CYMBALTA) 30 MG capsule  3. 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 Abnormal uterine bleeding   . Allergy   . Anxiety   . Arthritis   . Bipolar disorder (manic depression) ([place])   . Chronic kidney failure   . Chronic renal disease, stage III   . Diabetes mellitus without complication ([place])   . 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  . FH: trigeminal neuralgia   . GERD (gastroesophageal reflux disease)   . Heart murmur   . Hypertension   . Kidney mass   . Lupus ([place])   .  Lupus ([place])   . Major depressive disorder   . 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 Migraine   . Morbid obesity ([place])   . Neuromuscular disorder (HCC)    neuropathy  . Neuropathy   . Personality disorder ([place])   . Post herpetic neuralgia   . PTSD (post-traumatic stress disorder)   . Renal disorder   . S/P liver transplant [facility])     Past Surgical History:  Procedure Laterality Date  . BONE MARROW BIOPSY  01/13/2015  . BREAST BIOPSY  12/2014  . BREAST BIOPSY  2011  . BREAST SURGERY    .  CHOLECYSTECTOMY    . HERNIA REPAIR    . infusaport    . LIVER TRANSPLANT  12/17/1991  . LUMBAR PUNCTURE    . PORT A CATH INJECTION ([place] [facility]X)    . tumor removal  2015    Family Psychiatric History: I have reviewed family psychiatric history from my progress note on 06/10/2019  Family History:  Family History  Problem Relation Age of Onset  . Heart disease Mother   . Hypertension Mother   . Cancer - Other Mother   . Bipolar disorder Mother   . Heart disease Father   . Hypertension Father   . Diabetes Father   . Parkinson's disease Maternal Grandmother   . Cancer Maternal Aunt   . Cancer Maternal Uncle   . Cancer Maternal Grandfather   . Lupus Paternal Grandmother   . Hypertension Brother     Social History: I have reviewed social history from my progress note on 06/10/2019 Social History   Socioeconomic History  . Marital status: Single    Spouse name: Not on file  . Number of children: 0  . Years of education: 9  . Highest education level: GED or equivalent  Occupational History  . Occupation: disabled  Tobacco Use  . Smoking status: Never Smoker  . Smokeless tobacco: Never Used  Vaping Use  . Vaping Use: Never used  Substance and Sexual Activity  . Alcohol use: Not Currently  . Drug use: Not Currently    Types: Marijuana    Comment: pain managment last used in early april  . Sexual activity: Not Currently  Other Topics Concern  . Not on file  Social History Narrative  . Not on file   Social Determinants of Health   Financial Resource Strain: Medium Risk  . Difficulty of Paying Living Expenses: Somewhat hard  Food Insecurity: Food Insecurity Present  . Worried About Charity fundraiser in the Last Year: Sometimes true  . Ran Out of Food in the Last Year: Sometimes true  Transportation Needs: Unmet Transportation Needs  . Lack of Transportation (Medical): Yes  . Lack of Transportation (Non-Medical): Yes  Physical Activity: Insufficiently Active  . Days of  Exercise per Week: 1 day  . Minutes of Exercise per Session: 30 min  Stress: Stress Concern Present  . Feeling of Stress : Rather much  Social Connections: Unknown  . Frequency of Communication with Friends and Family: Not on file  . Frequency of Social Gatherings with Friends and Family: Not on file  . Attends Religious Services: Not on file  . Active Member of Clubs or Organizations: Not on file  . Attends Archivist Meetings: Not on file  . Marital Status: Never married    Allergies:  Allergies  Allergen Reactions  . Penicillin G Itching, Rash, Anaphylaxis and Palpitations  . Carbamazepine  Other (See Comments)    Medication interaction-prograf  . Hydrocodone-Acetaminophen Itching  . Naproxen Itching    Metabolic Disorder Labs: Lab Results  Component Value Date   HGBA1C 6.6 (H) 07/15/2019   Lab Results  Component Value Date   PROLACTIN 13.4 07/15/2019   PROLACTIN 21.3 05/24/2019   No results found for: CHOL, TRIG, HDL, CHOLHDL, VLDL, LDLCALC Lab Results  Component Value Date   TSH 2.650 07/15/2019   TSH 4.650 (H) 05/24/2019    Therapeutic Level Labs: No results found for: LITHIUM No results found for: VALPROATE No components found for:  CBMZ  Current Medications: Current Outpatient Medications  Medication Sig Dispense Refill  . baclofen (LIORESAL) 20 MG tablet Take 1 tablet (20 mg total) by mouth 3 (three) times daily as needed for muscle spasms. 90 each 2  . benztropine (COGENTIN) 0.5 MG tablet Take 1 tablet (0.5 mg total) by mouth daily as needed for tremors. 30 tablet 1  . colchicine 0.6 MG tablet Take 0.6 mg by mouth 2 (two) times daily. Takes once daily    . Cranberry 500 MG TABS Take by mouth.    [name] glucose blood (PRECISION QID TEST) test strip Use once daily Use as instructed.    . hydrOXYzine (VISTARIL) 25 MG capsule Take 1 capsule (25 mg total) by mouth 2 (two) times daily as needed. For sever anxiety attacks 60 capsule 1  . Lidocaine 0.5 %  AERO Apply topically.    . liraglutide (VICTOZA) 18 MG/3ML SOPN Inject into the skin.    [name] lisinopril (ZESTRIL) 20 MG tablet Take 1 tablet by mouth once daily 90 tablet 0  . Magnesium Bisglycinate (MAG GLYCINATE) 100 MG TABS Take by mouth.    . magnesium oxide (MAG-OX) 400 MG tablet Take by mouth.    . Melatonin 5 MG CAPS Take 1-2 capsules (5-10 mg total) by mouth at bedtime. 100 capsule 0  . metFORMIN (GLUCOPHAGE) 1000 MG tablet Take 1 tablet by mouth 2 (two) times a day.    . mirabegron ER (MYRBETRIQ) 50 MG TB24 tablet Take 1 tablet (50 mg total) by mouth daily. 30 tablet 6  . mycophenolate (CELLCEPT) 250 MG capsule Take 1 capsule by mouth 2 (two) times a day.    [name] oxycodone (OXY-IR) 5 MG capsule Take by mouth.    . oxyCODONE (OXYCONTIN) 10 mg 12 hr tablet Take by mouth.    . pregabalin (LYRICA) 200 MG capsule Take 1 capsule (200 mg total) by mouth 3 (three) times daily. 90 capsule 2  . promethazine (PHENERGAN) 25 MG tablet Take 1 tablet by mouth as needed.    [name] Specialty Vitamins Products (MAGNESIUM, AMINO ACID CHELATE,) 133 MG tablet Take 1 tablet by mouth 2 (two) times daily.    . SUMAtriptan (IMITREX) 100 MG tablet TAKE 1 TABLET BY MOUTH ONCE AS NEEDED FOR MIGRAINE. MAY TAKE A SECOND DOSE AFTER 2 HOURS IF NEEDED    . tacrolimus (PROGRAF) 1 MG capsule Take 1 capsule by mouth once daily 30 capsule 0  . XTAMPZA ER 9 MG C12A Take 1 capsule by mouth every 12 (twelve) hours.    . baclofen (LIORESAL) 20 MG tablet Take by mouth. (Patient not taking: Reported on 05/29/2020)    . DULoxetine (CYMBALTA) 30 MG capsule Take 1 capsule (30 mg total) by mouth daily. To be combined with 60 mg daily 90 capsule 0  . DULoxetine (CYMBALTA) 60 MG capsule Take 1 capsule (60 mg total) by mouth daily. To be combined  with 30 mg daily 90 capsule 0  . QUEtiapine (SEROQUEL XR) 300 MG 24 hr tablet Take 1 tablet (300 mg total) by mouth at bedtime. 90 tablet 0   No current facility-administered medications for this visit.      Musculoskeletal: Strength & Muscle Tone: UTA Gait & Station: normal Patient leans: N/A  Psychiatric Specialty Exam: Review of Systems  Psychiatric/Behavioral: Positive for dysphoric mood and sleep disturbance.  All other systems reviewed and are negative.   Last menstrual period 02/04/2018.There is no height or weight on file to calculate BMI.  General Appearance: Casual  Eye Contact:  Fair  Speech:  Normal Rate  Volume:  Normal  Mood:  Depressed  Affect:  Congruent  Thought Process:  Goal Directed and Descriptions of Associations: Intact  Orientation:  Full (Time, Place, and Person)  Thought Content: Logical   Suicidal Thoughts:  No  Homicidal Thoughts:  No  Memory:  Immediate;   Fair Recent;   Fair Remote;   Fair  Judgement:  Fair  Insight:  Fair  Psychomotor Activity:  Normal  Concentration:  Concentration: Fair and Attention Span: Fair  Recall:  [employer] of Knowledge: Fair  Language: Fair  Akathisia:  No  Handed:  Right  AIMS (if indicated): UTA  Assets:  Communication Skills Desire for Improvement Housing  ADL's:  Intact  Cognition: WNL  Sleep:  Poor   Screenings: PHQ2-9     Nutrition from 09/23/2019 in [place] Office Visit from 09/16/2019 in [place] Management from 08/10/2019 in [facility] Office Visit from 07/30/2019 in [facility] Office Visit from 06/17/2019 in [facility]  PHQ-2 Total Score _0 0 4  PHQ-9 Total Score _1 0 16       Assessment and Plan: Alison [name] is a 44 year old Caucasian female on disability, has a history of bipolar disorder, PTSD, history of borderline personality disorder, chronic pain, fibromyalgia, trigeminal neuralgia, history of liver transplant, B-cell lymphoma, SLE, diabetes melitis, hypertension, OSA, migraine headache was evaluated by telemedicine today.  Patient with psychosocial  stressors of current health issues, the pandemic.  She is currently struggling with depression and sleep issues and will benefit from the following medication readjustment.  Plan as noted below.  Plan Bipolar disorder-depressed-mild-unstable Increase Seroquel extended release to 300 mg p.o. nightly Cymbalta 90 mg p.o. daily for her mood.  PTSD-stable Cymbalta 90 mg p.o. daily Hydroxyzine 25 mg p.o. 3 times daily as needed for anxiety attacks Melatonin 5 to 10 mg p.o. nightly Continue CBT with therapist in [place]  Neuroleptic induced Parkinson's disease-stable Cogentin 0.5 mg p.o. daily as needed  Follow-up in clinic in 4 to 5 weeks or sooner if needed.  I have spent atleast 20 minutes  face to face with patient today. More than 50 % of the time was spent for  ordering medications and test ,psychoeducation and supportive psychotherapy and care coordination,as well as documenting clinical information in electronic health record. This note was generated in part or whole with voice recognition software. Voice recognition is usually quite accurate but there are transcription errors that can and very often do occur. I apologize for any typographical errors that were not detected and corrected.      [name], MD 05/29/2020, 3:41 PM

## 2020-05-31 ENCOUNTER — Telehealth: Payer: Self-pay

## 2020-05-31 NOTE — Telephone Encounter (Signed)
Pt needs appt for refill

## 2020-06-02 NOTE — Progress Notes (Signed)
Patient ID: Alison [name], female    DOB: 04-21-1976, 44 y.o.   MRN: [id number]  PCP: Towanda Malkin, MD  Chief Complaint  Patient presents with  . Follow-up  . Medication Refill    Subjective:   Alison [name] is a 44 y.o. female, presents to clinic with CC of the following:  Chief Complaint  Patient presents with  . Follow-up  . Medication Refill    HPI:  Patient is a 44 year old former patient of Raelyn Ensign Last seen by her in December 2020 Follows up today.  Bipolar I/PTSD: Seeing Dr. Shea Evans,  last visit 05/29/2020 with details of that visit not available to view Medication regimen- off of ativan - taking hydroxyzine PRN, seroquel at night; cymbalta.  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 She was ill at age 46 and had a liver transplant at age 19. She has blood work every 3 months, but has never had any rejection issues. . She states that her chronic pain is related to chemo-related damage - seeing pain management for this.Seeing Dr. Allen Norris every 6 months now for routine follow up. Last follow-up with transplant was in early June and noted the following: Reviewed 6/2 MRI abdomen with Dr. Manuella Ghazi. No interventions recommended. Will continue monitoring monthly labs. If LFTs continue to rise MD stated he would consider a TJ liver biopsy with pressures.    Marginal B-cell Lymphoma (Stage II Extranodal): Seeing Dr. Janese Banks, last visit on 01/28/2020  with the assessment/plan as follows: (sees her again tomorrow)  Assessment and plan- Patient is a 44 y.o. 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 This is an in person visit for physical exam and assessment of jaw pain 1.  Clinically patient seems to be doing well with no concerning signs and symptoms of recurrence based on today's exam.  Specifically I do not palpate any swelling deformity or lymph nodes along the cervical chain or elsewhere.  If her pain persists I will consider getting a CT maxillofacial at that time her blood counts are normal.  There would be no role for routine imaging at this time.  Patient will let me know if her pain is getting worse 2.  Patient noted to have abnormal LFTs during her last visit.  Repeat levels checked at [facility] show improvement.  She is following up with them given her history of liver transplant.  Trigeminal Neuralgia and Chronic Pain: Saw neurosurgery in March 2021 with the following assessment/plan:  I described the nature of trigeminal neuralgia and its management in detail including ongoing medication, microvascular decompression (MVD), percutaneous trigeminal rhizotomy using balloon compression (PTR), and Gamma Knife radiosurgery (GKRS). Because of her imaging findings and medical comorbidities, I recommend GKRS for her. Ms. Deen demonstrated good understanding and requests that we proceed with GKRS. Arrangements will be made for her to undergo GKRS on the next available treatment date.  Arrangements were made with radiation oncology to proceed in April, although that appointment was canceled. Agreed to wait on GKRS, as could exacerbate the conditon and was better  She switched from Dr. Holley Raring to Dr. Humphrey Rolls for pain management and is happy with this change.  She has also seen neurology(Dr. Shah)for trigeminal neuralgia pain.  She is taking cymbalta.She is still taking Lyrica 285m TIDand has baclofen that she takes BID, with 1 additional dose PRN.    HTN:  Medication regimen-lisinopril 285mdaily;  Does check blood pressures at home when gets HA, not check recently BP  Readings from Last 3 Encounters:  06/05/20 110/70  04/20/20 (!) 164/101  04/18/20 134/82    No chest pain, palpitations, shortness of breath, or increased headaches, vision changes  Diabetes mellitustype 2: Followed by endocrinology, with last visit in March 2021 with the following assessment/plan noted:   Assessment/Plan: Diabetes -  - We discussed diabetes, its complications, and goals of care. Patient was advised of the importance of diet and behavioral changes to improve glycemic control.  - Diabetes is poorly controlled likely due to lack of physical exercise activity and dietary indiscretions. -I will adjust her diabetic medication regimen as follows: - Continue metformin 1000 mg twice daily. _Add Victoza 0.6 mg daily - not taking like supposed to   Patient was advised of the importance of regular monitoring of blood sugars.  - Instructed to monitor blood sugars fasting each morning. A prescription for a new testing supplies will be sent to patient's local pharmacy. - Reminded to bring blood sugar log and/or meter to every visit  Morbid obesity -  Patient was advised of the importance of a low carbohydrate diet. Patient was advised of the importance of achieving and maintaining a healthy body weight. She is encouraged to lose weight and is advised to follow a low carbohydrate diet with intake of lean proteins, supplemental vegetables and low-fat food choices. The importance of portion control is emphasized. Patient is encouraged to participate in regular exercise activity, striving for 30 minutes of sustained exercise activity 4-5 times weekly.  Hypertension /CKD 3-  Blood pressure is well controlled. Patient is advised to therapy lisinopril blood pressure control/renal protection. Follow-up with Dr. KoJuleen Chinan Nephrology as scheduled  Return to clinic in 6 weeks for follow up, but has not followed up since that visit. Noted needs to schedule follow-up. Not been checking sugars at  home in recent past except a rare check. Was 225 last week.   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 DeLucky Cowboywith last visit 04/18/2020 and with improvements noted at that time and have continued. Noted hypertension, diabetes, chronic kidney disease, and her lymphoma history could be contributors  Gout -[facility] gout flare of her toe, treated with prednisone. Seen again two weeks ago at walk in clinic for this concern, and put on colchicine.Is better.    FMS/SLE/DDD: She was diagnosed with FMS for about 10 years, SLE for about 17 years. She states she has likely had SLE since age 89 Taking lyricaand doing okay on this, off of all opiate therapies.  She is seeing Dr. KaChancy Milroyow for pain management as above.  OSA: She has OSA, but does not wear her CPAP because of her trigeminal  neuralgia.  Recurrent UTI/Overactive Bladder: Currently followed by urology for this, with her last visit in mid July 2021 with the following noted:  1. Recurrent UTI 2-week history of dysuria, urine consistent with infection.  Will send for culture for further evaluation and start empiric Cipro.  Counseled patient that I would contact her if her urine culture results indicate a necessary change in therapy.  She expressed understanding. - Urinalysis, Complete - CULTURE, URINE COMPREHENSIVE - ciprofloxacin (CIPRO) 250 MG tablet; Take 1 tablet (250 mg total) by mouth 2 (two) times daily for 5 days.  Dispense:  10 tablet; Refill: 0 Return if worsening or symptoms fail to improve  Umbilical Hernia: She notes multiple repairs in the past  Patient Active Problem List   Diagnosis Date Noted  . 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 Edema of lower extremity 09/16/2019  . Carpal tunnel syndrome, left 07/26/2019  . Cubital tunnel syndrome on left 07/26/2019  . Bipolar I disorder, most recent episode depressed ([place]) 07/12/2019  . PTSD (post-traumatic stress disorder) 07/12/2019  . Umbilical hernia without obstruction and without gangrene 06/17/2019  . Encounter for surveillance of abnormal nevi 06/17/2019  . Pineal gland cyst 06/17/2019  . Chronic hip pain, left 06/02/2019  . Lumbar spondylosis 06/02/2019  . Mouth dryness 06/02/2019  . Obesity (BMI 35.0-39.9 without comorbidity) 06/02/2019  . Goals of care, counseling/discussion 05/18/2019  . 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[place]) 05/18/2019  . 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 Occipital neuralgia 05/13/2019  . Plantar fasciitis of left foot 04/27/2019  . Fibromyalgia 03/23/2019  . Systemic lupus erythematosus (SLE) in adult ([place]) 03/23/2019  . Essential hypertension 03/23/2019  . Hepatitis 03/23/2019  . Mass of left kidney 03/23/2019  . Diabetes mellitus ([place]) 03/23/2019  . Nausea without vomiting 03/23/2019  . Neuropathy 03/23/2019  . Cancer ([place]) 03/23/2019  . Hyperlipidemia 03/23/2019  . Osteoarthritis 03/23/2019  . Trigeminal neuralgia 03/23/2019  . Chronic renal disease, stage III 03/23/2019  . Nephrolithiasis 03/23/2019  . Migraine 03/23/2019  . OSA on CPAP 03/23/2019  . Fibrocystic breast 03/23/2019  . Heart murmur 03/23/2019  . Bipolar disorder, in partial remission, most recent episode depressed ([place]) 07/02/2017  . Abnormal uterine bleeding 03/18/2017  . Major depressive disorder, recurrent ([place]) 03/18/2017  . Morbid obesity ([place]) 03/18/2017    . Secondary hyperparathyroidism ([place]) 02/07/2017  . Neuropathic pain 11/19/2016  . Post herpetic neuralgia 11/19/2016  . 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[place]) 01/13/2015  . Lumbar disc disease with radiculopathy 07/26/2013  . S/P liver transplant ([place]) 07/26/2013  . Type 2 diabetes mellitus, uncontrolled ([place]) 07/26/2013  . Facial nerve disorder 06/29/2012  . Low back pain 12/26/2010      Current Outpatient Medications:  .  baclofen (LIORESAL) 20 MG tablet, Take 1 tablet (20 mg total) by mouth 3 (three) times daily as needed for muscle spasms., Disp: 90 each, Rfl: 2 .  baclofen (LIORESAL) 20 MG tablet, Take by mouth. , Disp: , Rfl:  .  benztropine (COGENTIN) 0.5 MG tablet, Take 1 tablet (0.5 mg total) by mouth daily as needed for tremors., Disp: 30 tablet, Rfl: 1 .  colchicine 0.6 MG tablet, Take 0.6 mg by mouth 2 (two) times daily. Takes once daily, Disp: , Rfl:  .  DULoxetine (CYMBALTA) 30 MG capsule, Take 1 capsule (30 mg total) by mouth daily. To be combined with 60 mg daily, Disp: 90 capsule, Rfl: 0 .  DULoxetine (CYMBALTA) 60 MG  capsule, Take 1 capsule (60 mg total) by mouth daily. To be combined with 30 mg daily, Disp: 90 capsule, Rfl: 0 .  glucose blood (PRECISION QID TEST) test strip, Use once daily Use as instructed., Disp: , Rfl:  .  hydrOXYzine (VISTARIL) 25 MG capsule, Take 1 capsule (25 mg total) by mouth 2 (two) times daily as needed. For sever anxiety attacks, Disp: 60 capsule, Rfl: 1 .  Lidocaine 0.5 % AERO, Apply topically., Disp: , Rfl:  .  liraglutide (VICTOZA) 18 MG/3ML SOPN, Inject into the skin., Disp: , Rfl:  .  lisinopril (ZESTRIL) 20 MG tablet, Take 1 tablet by mouth once daily, Disp: 90 tablet, Rfl: 0 .  Magnesium Bisglycinate (MAG GLYCINATE) 100 MG TABS, Take by mouth., Disp: , Rfl:  .  magnesium oxide (MAG-OX) 400 MG tablet, Take by mouth., Disp: , Rfl:  .  Melatonin 5 MG CAPS, Take 1-2 capsules (5-10 mg total) by mouth at bedtime., Disp: 100 capsule,  Rfl: 0 .  metFORMIN (GLUCOPHAGE) 1000 MG tablet, Take 1 tablet by mouth 2 (two) times a day., Disp: , Rfl:  .  oxycodone (OXY-IR) 5 MG capsule, Take by mouth., Disp: , Rfl:  .  promethazine (PHENERGAN) 25 MG tablet, Take 1 tablet by mouth as needed., Disp: , Rfl:  .  QUEtiapine (SEROQUEL XR) 300 MG 24 hr tablet, Take 1 tablet (300 mg total) by mouth at bedtime., Disp: 90 tablet, Rfl: 0 .  Specialty Vitamins Products (MAGNESIUM, AMINO ACID CHELATE,) 133 MG tablet, Take 1 tablet by mouth 2 (two) times daily., Disp: , Rfl:  .  SUMAtriptan (IMITREX) 100 MG tablet, TAKE 1 TABLET BY MOUTH ONCE AS NEEDED FOR MIGRAINE. MAY TAKE A SECOND DOSE AFTER 2 HOURS IF NEEDED, Disp: , Rfl:  .  tacrolimus (PROGRAF) 1 MG capsule, Take 1 capsule by mouth once daily, Disp: 30 capsule, Rfl: 0 .  XTAMPZA ER 9 MG C12A, Take 1 capsule by mouth every 12 (twelve) hours., Disp: , Rfl:  .  Cranberry 500 MG TABS, Take by mouth. (Patient not taking: Reported on 06/05/2020), Disp: , Rfl:  .  mirabegron ER (MYRBETRIQ) 50 MG TB24 tablet, Take 1 tablet (50 mg total) by mouth daily. (Patient not taking: Reported on 06/05/2020), Disp: 30 tablet, Rfl: 6 .  mycophenolate (CELLCEPT) 250 MG capsule, Take 1 capsule by mouth 2 (two) times a day. (Patient not taking: Reported on 06/05/2020), Disp: , Rfl:  .  oxyCODONE (OXYCONTIN) 10 mg 12 hr tablet, Take by mouth. (Patient not taking: Reported on 06/05/2020), Disp: , Rfl:  .  pregabalin (LYRICA) 200 MG capsule, Take 1 capsule (200 mg total) by mouth 3 (three) times daily., Disp: 90 capsule, Rfl: 2   Allergies  Allergen Reactions  . Penicillin G Itching, Rash, Anaphylaxis and Palpitations  . Carbamazepine Other (See Comments)    Medication interaction-prograf  . Hydrocodone-Acetaminophen Itching  . Naproxen Itching     Past Surgical History:  Procedure Laterality Date  . BONE MARROW BIOPSY  01/13/2015  . BREAST BIOPSY  12/2014  . BREAST BIOPSY  2011  . BREAST SURGERY    .  CHOLECYSTECTOMY    . HERNIA REPAIR    . infusaport    . LIVER TRANSPLANT  12/17/1991  . LUMBAR PUNCTURE    . PORT A CATH INJECTION ([place] [facility])    . tumor removal  2015     Family History  Problem Relation Age of Onset  . Heart disease Mother   . Hypertension  Mother   . Cancer - Other Mother   . Bipolar disorder Mother   . Heart disease Father   . Hypertension Father   . Diabetes Father   . Parkinson's disease Maternal Grandmother   . Cancer Maternal Aunt   . Cancer Maternal Uncle   . Cancer Maternal Grandfather   . Lupus Paternal Grandmother   . Hypertension Brother      Social History   Tobacco Use  . Smoking status: Never Smoker  . Smokeless tobacco: Never Used  Substance Use Topics  . Alcohol use: Not Currently    With staff assistance, above reviewed with the patient today.  ROS: As per HPI, otherwise no specific complaints on a limited and focused system review   No results found for this or any previous visit (from the past 72 hour(s)).   PHQ2/9: Depression screen [facility] 2/9 06/05/2020 09/23/2019 09/16/2019 08/10/2019 07/30/2019  Decreased Interest _0 0  Down, Depressed, Hopeless _1 0  PHQ - 2 Score _2 0  Altered sleeping 3 3 0 0 0  Tired, decreased energy _3 0 0  Change in appetite _4 0  Feeling bad or failure about yourself  1 0 0 0 0  Trouble concentrating _5 0 0  Moving slowly or fidgety/restless 0 2 0 0 0  Suicidal thoughts 0 0 0 - 0  PHQ-9 Score _6 0  Difficult doing work/chores Very difficult - Not difficult at all - Not difficult at all   PHQ-2/9 Result reviewed, sees psychiatry and counseling presently  Fall Risk: Fall Risk  06/05/2020 09/23/2019 09/16/2019 07/30/2019 06/18/2019  Falls in the past year? 1 1 0 0 1  Number falls in past yr: 1 1 0 0 0  Comment 6 - - - -  Injury with Fall? 1 0 0 0 0  Risk for fall due to : - History of fall(s) - - -  Follow up Falls evaluation completed Falls prevention discussed  Falls evaluation completed Falls evaluation completed -      Objective:   Vitals:   06/05/20 0941  BP: 110/70  Pulse: 96  Resp: 16  Temp: 98.5 F (36.9 C)  TempSrc: Oral  SpO2: 96%  Weight: 266 lb 11.2 oz (121 kg)  Height: _7  (1.676 m)    Body mass index is 43.05 kg/m.  Physical Exam   NAD, masked HEENT - [place]/AT, sclera anicteric, PERRL, EOMI, conj - non-inj'ed,  pharynx clear Neck - supple, no adenopathy, no TM,  Car - RRR without m/g/r Pulm- RR and effort normal at rest, CTA without wheeze or rales Abd - soft, obese, NT diffusely, ND,  Back - no CVA tenderness Ext -trace LE edema, with subtle indentations at the upper sock line noted, no active joints Neuro/psychiatric - affect was not flat, appropriate with conversation  Alert   Grossly non-focal -adequate strength in the extremities, sensation intact to LT in distal extremities  Speech  normal   Results for orders placed or performed in visit on 04/20/20  CULTURE, URINE COMPREHENSIVE   Specimen: Urine   UR  Result Value Ref Range   Urine Culture, Comprehensive Final report    Organism ID, Bacteria Comment   Microscopic Examination   Urine  Result Value Ref Range   WBC, UA >30 (A) 0 - 5 /hpf   RBC 0-2 0 - 2 /hpf   Epithelial Cells (non  renal) 0-10 0 - 10 /hpf   Bacteria, UA Many (A) None seen/Few  Urinalysis, Complete  Result Value Ref Range   Specific Gravity, UA 1.020 1.005 - 1.030   pH, UA 7.0 5.0 - 7.5   Color, UA Yellow Yellow   Appearance Ur Cloudy (A) Clear   Leukocytes,UA 2+ (A) Negative   Protein,UA 1+ (A) Negative/Trace   Glucose, UA Negative Negative   Ketones, UA Negative Negative   RBC, UA Trace (A) Negative   Bilirubin, UA Negative Negative   Urobilinogen, Ur 0.2 0.2 - 1.0 mg/dL   Nitrite, UA Negative Negative   Microscopic Examination See below:        Assessment & Plan:   1. Encounter to establish care with new doctor  2. S/P liver transplant ([place]) Still followed by  transplant and continue She did have a booster for Covid last week noted. (She noted her antibodies were not detectable after she read received 2 doses of the vaccine) She noted she is due to get her labs checked by them again, and usually goes to [facility] to have that done.  She noted she would like to do them here today, when I discussed getting some labs - CBC with Differential/Platelet  3. 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 Janese Banks and continue  4. Obesity (BMI 35.0-39.9 without comorbidity) Her weight has been relatively stable in the recent past.  5. Essential hypertension Blood pressure remains controlled Continue the lisinopril, and that was renewed for her today. Also check some lab test. - COMPLETE METABOLIC PANEL WITH GFR  6.  Hyperlipidemia We will check a lipid panel with labs today, as one has not been done in the recent past. Did note concerns with adding a statin with her liver transplant history, and if a consideration, will ask transplant team to weigh in on that potential option.  7. Bipolar I disorder, most recent episode depressed ([place]) Continue with psychiatry and with counseling input, and the medications prescribed by psychiatry.  8. Edema of lower extremity She notes has been better in the recent past, as did Dr. Lucky Cowboy on his last follow-up.  9. Trigeminal neuralgia Did not proceed with the planned procedure as it was improved, and concerns with potential exacerbation of her symptoms with the procedure entertained.  Continuing to monitor presently.  10. Secondary hyperparathyroidism ([place]) 11. Pineal gland cyst Continue with endocrinology follow-up and input.  12. Overactive bladder Has been followed by urology and continue with their input  13. OSA (obstructive sleep apnea) She does not tolerate the CPAP with the trigeminal neuralgia history.  14. Type 2 diabetes mellitus with  hyperglycemia, without long-term current use of insulin ([place]) Discussed the importance of continuing to see endocrinology, as she has not been taking the Trulicity product recommended previously as directed.  Concern her sugars are not well controlled presently. She also has not been checking her blood sugars at home with any regularity. Emphasized the importance for her to call and make a follow-up with endocrine, and she noted she would do so. We will check an A1c with her labs as well as a urine for microalbumin. - COMPLETE METABOLIC PANEL WITH GFR - Hemoglobin A1c - Lipid panel - Microalbumin / creatinine urine ratio   Await lab results ordered, as she wanted to have them done today. Continue to follow-up with the outside specialist as noted above also important and noted that today to her as well. We  will follow-up at the latest in 4 months time, sooner as needed.   Towanda Malkin, MD 06/05/20 9:58 AM

## 2020-06-05 ENCOUNTER — Other Ambulatory Visit: Payer: Self-pay

## 2020-06-05 ENCOUNTER — Encounter: Payer: Self-pay | Admitting: Internal Medicine

## 2020-06-05 ENCOUNTER — Ambulatory Visit: Payer: Medicaid Other | Admitting: Internal Medicine

## 2020-06-05 VITALS — BP 110/70 | HR 96 | Temp 98.5°F | Resp 16 | Ht 66.0 in | Wt 266.7 lb

## 2020-06-05 DIAGNOSIS — Z7689 Persons encountering health services in other specified circumstances: Secondary | ICD-10-CM | POA: Diagnosis not present

## 2020-06-05 DIAGNOSIS — E1165 Type 2 diabetes mellitus with hyperglycemia: Secondary | ICD-10-CM | POA: Diagnosis not present

## 2020-06-05 DIAGNOSIS — N2581 Secondary hyperparathyroidism of renal origin: Secondary | ICD-10-CM | POA: Diagnosis not present

## 2020-06-05 DIAGNOSIS — R600 Localized edema: Secondary | ICD-10-CM | POA: Diagnosis not present

## 2020-06-05 DIAGNOSIS — E348 Other specified endocrine disorders: Secondary | ICD-10-CM | POA: Diagnosis not present

## 2020-06-05 DIAGNOSIS — E669 Obesity, unspecified: Secondary | ICD-10-CM | POA: Diagnosis not present

## 2020-06-05 DIAGNOSIS — G500 Trigeminal neuralgia: Secondary | ICD-10-CM

## 2020-06-05 DIAGNOSIS — F3130 Bipolar disorder, current episode depressed, mild or moderate severity, unspecified: Secondary | ICD-10-CM | POA: Diagnosis not present

## 2020-06-05 DIAGNOSIS — C884 Extranodal marginal zone B-cell lymphoma of mucosa-associated lymphoid tissue [MALT-lymphoma]: Secondary | ICD-10-CM | POA: Diagnosis not present

## 2020-06-05 DIAGNOSIS — G4733 Obstructive sleep apnea (adult) (pediatric): Secondary | ICD-10-CM

## 2020-06-05 DIAGNOSIS — N3281 Overactive bladder: Secondary | ICD-10-CM

## 2020-06-05 DIAGNOSIS — I10 Essential (primary) hypertension: Secondary | ICD-10-CM | POA: Diagnosis not present

## 2020-06-05 DIAGNOSIS — Z944 Liver transplant status: Secondary | ICD-10-CM

## 2020-06-06 ENCOUNTER — Inpatient Hospital Stay: Payer: Medicaid Other

## 2020-06-06 ENCOUNTER — Encounter: Payer: Self-pay | Admitting: Oncology

## 2020-06-06 ENCOUNTER — Inpatient Hospital Stay: Payer: Medicaid Other | Attending: Oncology | Admitting: Oncology

## 2020-06-06 VITALS — BP 143/94 | HR 82 | Temp 97.4°F | Wt 266.7 lb

## 2020-06-06 DIAGNOSIS — Z08 Encounter for follow-up examination after completed treatment for malignant neoplasm: Secondary | ICD-10-CM | POA: Diagnosis not present

## 2020-06-06 DIAGNOSIS — R5383 Other fatigue: Secondary | ICD-10-CM | POA: Insufficient documentation

## 2020-06-06 DIAGNOSIS — Z8572 Personal history of non-Hodgkin lymphomas: Secondary | ICD-10-CM | POA: Diagnosis not present

## 2020-06-06 DIAGNOSIS — C884 Extranodal marginal zone B-cell lymphoma of mucosa-associated lymphoid tissue [MALT-lymphoma]: Secondary | ICD-10-CM | POA: Insufficient documentation

## 2020-06-06 DIAGNOSIS — Z944 Liver transplant status: Secondary | ICD-10-CM | POA: Insufficient documentation

## 2020-06-06 DIAGNOSIS — R5381 Other malaise: Secondary | ICD-10-CM | POA: Insufficient documentation

## 2020-06-06 DIAGNOSIS — Z79899 Other long term (current) drug therapy: Secondary | ICD-10-CM | POA: Insufficient documentation

## 2020-06-06 DIAGNOSIS — Z7984 Long term (current) use of oral hypoglycemic drugs: Secondary | ICD-10-CM | POA: Diagnosis not present

## 2020-06-06 DIAGNOSIS — N183 Chronic kidney disease, stage 3 unspecified: Secondary | ICD-10-CM | POA: Diagnosis not present

## 2020-06-06 DIAGNOSIS — I129 Hypertensive chronic kidney disease with stage 1 through stage 4 chronic kidney disease, or unspecified chronic kidney disease: Secondary | ICD-10-CM | POA: Insufficient documentation

## 2020-06-06 DIAGNOSIS — E119 Type 2 diabetes mellitus without complications: Secondary | ICD-10-CM | POA: Diagnosis not present

## 2020-06-06 LAB — COMPLETE METABOLIC PANEL WITH GFR
AG Ratio: 1.4 (calc) (ref 1.0–2.5)
ALT: 44 U/L — ABNORMAL HIGH (ref 6–29)
AST: 55 U/L — ABNORMAL HIGH (ref 10–35)
Albumin: 4 g/dL (ref 3.6–5.1)
Alkaline phosphatase (APISO): 71 U/L (ref 31–125)
BUN: 17 mg/dL (ref 7–25)
CO2: 22 mmol/L (ref 20–32)
Calcium: 9.3 mg/dL (ref 8.6–10.2)
Chloride: 100 mmol/L (ref 98–110)
Creat: 1.02 mg/dL (ref 0.50–1.10)
GFR, Est African American: 76 mL/min/{1.73_m2} (ref >=60)
GFR, Est Non African American: 66 mL/min/{1.73_m2} (ref >=60)
Globulin: 2.8 g/dL (calc) (ref 1.9–3.7)
Glucose, Bld: 208 mg/dL — ABNORMAL HIGH (ref 65–99)
Potassium: 4.3 mmol/L (ref 3.5–5.3)
Sodium: 136 mmol/L (ref 135–146)
Total Bilirubin: 0.6 mg/dL (ref 0.2–1.2)
Total Protein: 6.8 g/dL (ref 6.1–8.1)

## 2020-06-06 LAB — CBC WITH DIFFERENTIAL/PLATELET
Absolute Monocytes: 495 cells/uL (ref 200–950)
Basophils Absolute: 41 cells/uL (ref 0–200)
Basophils Relative: 0.9 %
Eosinophils Absolute: 509 cells/uL — ABNORMAL HIGH (ref 15–500)
Eosinophils Relative: 11.3 %
HCT: 39 % (ref 35.0–45.0)
Hemoglobin: 12.9 g/dL (ref 11.7–15.5)
Lymphs Abs: 1184 cells/uL (ref 850–3900)
MCH: 29.8 pg (ref 27.0–33.0)
MCHC: 33.1 g/dL (ref 32.0–36.0)
MCV: 90.1 fL (ref 80.0–100.0)
MPV: 11 fL (ref 7.5–12.5)
Monocytes Relative: 11 %
Neutro Abs: 2273 cells/uL (ref 1500–7800)
Neutrophils Relative %: 50.5 %
Platelets: 132 10*3/uL — ABNORMAL LOW (ref 140–400)
RBC: 4.33 10*6/uL (ref 3.80–5.10)
RDW: 13.7 % (ref 11.0–15.0)
Total Lymphocyte: 26.3 %
WBC: 4.5 10*3/uL (ref 3.8–10.8)

## 2020-06-06 LAB — LIPID PANEL
Cholesterol: 193 mg/dL (ref <200)
HDL: 36 mg/dL — ABNORMAL LOW (ref >=50)
LDL Cholesterol (Calc): 111 mg/dL (calc) — ABNORMAL HIGH
Non-HDL Cholesterol (Calc): 157 mg/dL (calc) — ABNORMAL HIGH (ref <130)
Total CHOL/HDL Ratio: 5.4 (calc) — ABNORMAL HIGH (ref <5.0)
Triglycerides: 348 mg/dL — ABNORMAL HIGH (ref <150)

## 2020-06-06 LAB — HEMOGLOBIN A1C
Hgb A1c MFr Bld: 8.9 % of total Hgb — ABNORMAL HIGH (ref <5.7)
Mean Plasma Glucose: 209 (calc)
eAG (mmol/L): 11.6 (calc)

## 2020-06-06 LAB — MICROALBUMIN / CREATININE URINE RATIO
Creatinine, Urine: 113 mg/dL (ref 20–275)
Microalb Creat Ratio: 165 mcg/mg creat — ABNORMAL HIGH (ref <30)
Microalb, Ur: 18.7 mg/dL

## 2020-06-06 NOTE — Telephone Encounter (Signed)
[place] labs today

## 2020-06-06 NOTE — Progress Notes (Signed)
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 She is currently on CellCept and tacrolimus for the same. She also has a history of post transplant lymphoproliferative disorder/DLBCL that was diagnosed in 2016. She is s/p 6 cycles of R-CHOP chemotherapy back then and was in complete remission 1. Her other past medical history significant for migraines, stage III chronic kidney disease, chemo-induced peripheral neuropathy, hypertension among other medical problems. With regards to her diffuse large B-cell lymphoma she was getting surveillance scans and this was all done in [place]. She has now moved to [facility] to be with her significant other.Patient was noted to have a prior kidney mass before which was biopsied and was not consistent with malignancy.  She underwent CT neck with contrast before she left [place] on 02/22/2019. She was noted to have soft tissue masses in both the parotid glands which were new as compared to prior exams and possibly represent lymph nodes. The largest one was seen in the left parotid gland measuring 1.3 x 1.1 cm. Before she could get a work-up for this patient moved to [place] and has not had any further work-up yet  Her other prior imaging is as follows:  MRI thoracic spine without contrast in August 2019 showed moderate multilevel spondylosis but no evidence of lymphoma. Multinodular thyroid in June 2019. MRI cervical spine May 2019 again showed spondylosis but no other acute pathology. MRI brain showed incidental partial opacification of the right and right sinus. No evidence of malignancy. I do not have any other PET CT scan or treatment records from the past.  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 Bone marrow biopsywas negative for lymphoma.  Patient completed 4 cycles of weekly RituxanIn September 2020.  Repeat PET CT scan showed interval resolution of the parotid lesions compatible with complete response to therapy  Interval history-she reports feeling well. Appetite and weight have remained stable. Denies any new aches or pains anywhere. Denies any new lumps anywhere.  ECOG PS- 1 Pain scale- 0   Review of systems- Review of Systems  Constitutional: Positive for malaise/fatigue. Negative for chills, fever and weight loss.  HENT: Negative for congestion, ear discharge and nosebleeds.   Eyes: Negative for blurred vision.  Respiratory: Negative for cough, hemoptysis, sputum production, shortness of breath and wheezing.   Cardiovascular: Negative for chest pain, palpitations, orthopnea and claudication.  Gastrointestinal: Negative for abdominal pain, blood in stool, constipation, diarrhea, heartburn, melena, nausea and vomiting.  Genitourinary: Negative for dysuria, flank pain, frequency, hematuria and urgency.  Musculoskeletal: Negative for back pain, joint pain and myalgias.  Skin: Negative for rash.  Neurological: Negative for dizziness, tingling, focal weakness, seizures, weakness and headaches.  Endo/Heme/Allergies: Does not bruise/bleed easily.  Psychiatric/Behavioral: Negative for depression and suicidal ideas. The patient does not have insomnia.        Allergies  Allergen  Reactions  .  Penicillin G Itching, Rash, Anaphylaxis and Palpitations  . Carbamazepine Other (See Comments)    Medication interaction-prograf  . Hydrocodone-Acetaminophen Itching  . Naproxen Itching     Past Medical History:  Diagnosis Date  . Abnormal uterine bleeding   . Allergy   . Anxiety   . Arthritis   . Bipolar disorder (manic depression) ([place])   . Chronic kidney failure   . Chronic renal disease, stage III   . Diabetes mellitus without complication ([place])   . 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[place]) 2015   Right axillary lymph node resected and chemo tx's.  . FH: trigeminal neuralgia   . GERD (gastroesophageal reflux disease)   . Heart murmur   . Hypertension   . Kidney mass   . Lupus ([place])   . Lupus ([place])   . Major depressive disorder   . 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[place]) 06/2019   Chemo tx's  . Migraine   . Morbid obesity ([place])   . Neuromuscular disorder ([facility])    neuropathy  . Neuropathy   . Personality disorder ([place])   . Post herpetic neuralgia   . PTSD (post-traumatic stress disorder)   . Renal disorder   . S/P liver transplant [facility])      Past Surgical History:  Procedure Laterality Date  . BONE MARROW BIOPSY  01/13/2015  . BREAST BIOPSY  12/2014  . BREAST BIOPSY  2011  . BREAST SURGERY    . CHOLECYSTECTOMY    . HERNIA REPAIR    . infusaport    . LIVER TRANSPLANT  12/17/1991  . LUMBAR PUNCTURE    . PORT A CATH INJECTION ([place] [facility])    . tumor removal  2015    Social History   Socioeconomic History  . Marital status: Single    Spouse name: Not on file  . Number of children: 0  . Years of education: 9  . Highest education level: GED or equivalent  Occupational History  . Occupation: disabled  Tobacco Use  . Smoking status: Never Smoker  . Smokeless tobacco: Never Used  Vaping Use  . Vaping Use: Never used  Substance and Sexual Activity  . Alcohol use: Not Currently  . Drug use: Not Currently    Types: Marijuana    Comment: pain  managment last used in early april  . Sexual activity: Not Currently  Other Topics Concern  . Not on file  Social History Narrative  . Not on file   Social Determinants of Health   Financial Resource Strain: Medium Risk  . Difficulty of Paying Living Expenses: Somewhat hard  Food Insecurity: Food Insecurity Present  . Worried About Charity fundraiser in the Last Year: Sometimes true  . Ran Out of Food in the Last Year: Sometimes true  Transportation Needs: Unmet Transportation Needs  . Lack of Transportation (Medical): Yes  . Lack of Transportation (Non-Medical): Yes  Physical Activity: Insufficiently Active  . Days of Exercise per Week: 1 day  . Minutes of Exercise per Session: 30 min  Stress: Stress Concern Present  . Feeling of Stress : Rather much  Social Connections: Unknown  . Frequency of Communication with Friends and Family: Not on file  . Frequency of Social Gatherings with Friends and Family: Not on file  . Attends Religious Services: Not on file  . Active Member of Clubs or Organizations: Not on file  . Attends Archivist Meetings: Not on file  . Marital Status: Never married  Intimate  Partner Violence:   . Fear of Current or Ex-Partner: Not on file  . Emotionally Abused: Not on file  . Physically Abused: Not on file  . Sexually Abused: Not on file    Family History  Problem Relation Age of Onset  . Heart disease Mother   . Hypertension Mother   . Cancer - Other Mother   . Bipolar disorder Mother   . Heart disease Father   . Hypertension Father   . Diabetes Father   . Parkinson's disease Maternal Grandmother   . Cancer Maternal Aunt   . Cancer Maternal Uncle   . Cancer Maternal Grandfather   . Lupus Paternal Grandmother   . Hypertension Brother      Current Outpatient Medications:  .  baclofen (LIORESAL) 20 MG tablet, Take 1 tablet (20 mg total) by mouth 3 (three) times daily as needed for muscle spasms., Disp: 90 each, Rfl: 2 .   benztropine (COGENTIN) 0.5 MG tablet, Take 1 tablet (0.5 mg total) by mouth daily as needed for tremors., Disp: 30 tablet, Rfl: 1 .  colchicine 0.6 MG tablet, Take 0.6 mg by mouth 2 (two) times daily. Takes once daily, Disp: , Rfl:  .  DULoxetine (CYMBALTA) 30 MG capsule, Take 1 capsule (30 mg total) by mouth daily. To be combined with 60 mg daily, Disp: 90 capsule, Rfl: 0 .  DULoxetine (CYMBALTA) 60 MG capsule, Take 1 capsule (60 mg total) by mouth daily. To be combined with 30 mg daily, Disp: 90 capsule, Rfl: 0 .  glucose blood (PRECISION QID TEST) test strip, Use once daily Use as instructed., Disp: , Rfl:  .  hydrOXYzine (VISTARIL) 25 MG capsule, Take 1 capsule (25 mg total) by mouth 2 (two) times daily as needed. For sever anxiety attacks, Disp: 60 capsule, Rfl: 1 .  Lidocaine 0.5 % AERO, Apply topically., Disp: , Rfl:  .  liraglutide (VICTOZA) 18 MG/3ML SOPN, Inject into the skin., Disp: , Rfl:  .  lisinopril (ZESTRIL) 20 MG tablet, Take 1 tablet by mouth once daily, Disp: 90 tablet, Rfl: 0 .  Magnesium Bisglycinate (MAG GLYCINATE) 100 MG TABS, Take by mouth., Disp: , Rfl:  .  magnesium oxide (MAG-OX) 400 MG tablet, Take by mouth., Disp: , Rfl:  .  Melatonin 5 MG CAPS, Take 1-2 capsules (5-10 mg total) by mouth at bedtime., Disp: 100 capsule, Rfl: 0 .  metFORMIN (GLUCOPHAGE) 1000 MG tablet, Take 1 tablet by mouth 2 (two) times a day., Disp: , Rfl:  .  pregabalin (LYRICA) 200 MG capsule, Take 1 capsule (200 mg total) by mouth 3 (three) times daily., Disp: 90 capsule, Rfl: 2 .  QUEtiapine (SEROQUEL XR) 300 MG 24 hr tablet, Take 1 tablet (300 mg total) by mouth at bedtime., Disp: 90 tablet, Rfl: 0 .  Specialty Vitamins Products (MAGNESIUM, AMINO ACID CHELATE,) 133 MG tablet, Take 1 tablet by mouth 2 (two) times daily., Disp: , Rfl:  .  SUMAtriptan (IMITREX) 100 MG tablet, TAKE 1 TABLET BY MOUTH ONCE AS NEEDED FOR MIGRAINE. MAY TAKE A SECOND DOSE AFTER 2 HOURS IF NEEDED, Disp: , Rfl:  .   tacrolimus (PROGRAF) 1 MG capsule, Take 1 capsule by mouth once daily, Disp: 30 capsule, Rfl: 0 .  XTAMPZA ER 9 MG C12A, Take 1 capsule by mouth every 12 (twelve) hours., Disp: , Rfl:  .  Cranberry 500 MG TABS, Take by mouth. (Patient not taking: Reported on 06/05/2020), Disp: , Rfl:  .  promethazine (PHENERGAN) 25 MG  tablet, Take 1 tablet by mouth as needed. (Patient not taking: Reported on 06/06/2020), Disp: , Rfl:   Physical exam:  Vitals:   06/06/20 0948  BP: (!) 143/94  Pulse: 82  Temp: (!) 97.4 F (36.3 C)  TempSrc: Tympanic  SpO2: 100%  Weight: 266 lb 11.2 oz (121 kg)   Physical Exam Constitutional:      General: She is not in acute distress. Cardiovascular:     Rate and Rhythm: Normal rate and regular rhythm.     Heart sounds: Normal heart sounds.  Pulmonary:     Effort: Pulmonary effort is normal.     Breath sounds: Normal breath sounds.  Abdominal:     General: Bowel sounds are normal.     Palpations: Abdomen is soft.  Lymphadenopathy:     Comments: No palpable cervical, supraclavicular, axillary or inguinal adenopathy   Skin:    General: Skin is warm and dry.  Neurological:     Mental Status: She is alert and oriented to person, place, and time.      CMP Latest Ref Rng & Units 06/05/2020  Glucose 65 - 99 mg/dL 208(H)  BUN 7 - 25 mg/dL 17  Creatinine 0.50 - 1.10 mg/dL 1.02  Sodium 135 - 146 mmol/L 136  Potassium 3.5 - 5.3 mmol/L 4.3  Chloride 98 - 110 mmol/L 100  CO2 20 - 32 mmol/L 22  Calcium 8.6 - 10.2 mg/dL 9.3  Total Protein 6.1 - 8.1 g/dL 6.8  Total Bilirubin 0.2 - 1.2 mg/dL 0.6  Alkaline Phos 38 - 126 U/L -  AST 10 - 35 U/L 55(H)  ALT 6 - 29 U/L 44(H)   CBC Latest Ref Rng & Units 06/05/2020  WBC 3.8 - 10.8 Thousand/uL 4.5  Hemoglobin 11.7 - 15.5 g/dL 12.9  Hematocrit 35 - 45 % 39.0  Platelets 140 - 400 Thousand/uL 132(L)     Assessment and plan- Patient is a 44 y.o. female with stage II extranodal marginal zone lymphoma involving bilateral  parotid gland.She is s/p 4 weekly cycles of Rituxan chemotherapy. This is a routine follow-up visit  Clinically patient is doing well with no concerning signs and symptoms of recurrence based on today's exam. Her labs are unremarkable except for mild intermittent thrombocytopenia which has remained stable overall.Patient also noted to have mildly abnormal LFTs based on her recent labs which were better as compared to April 2021 and she follows up with the [facility] team. She has received her booster dose of Covid vaccine as well. I will see her back in 6 months with CBC with differential CMP and LDH   Visit Diagnosis 1. Encounter for follow-up surveillance of lymphoma      Dr. Randa Evens, MD, MPH [facility] [phone] 06/06/2020 12:58 PM

## 2020-06-06 NOTE — Progress Notes (Signed)
Pt states has been doing well.  Reports had 3 booster of Pfizer covid vaccine.

## 2020-06-07 ENCOUNTER — Other Ambulatory Visit: Payer: Self-pay

## 2020-06-07 DIAGNOSIS — E1165 Type 2 diabetes mellitus with hyperglycemia: Secondary | ICD-10-CM

## 2020-06-07 DIAGNOSIS — I10 Essential (primary) hypertension: Secondary | ICD-10-CM

## 2020-06-07 MED ORDER — LISINOPRIL 20 MG PO TABS: 20.0000 mg | ORAL_TABLET | Freq: Every day | ORAL | 3 refills | Status: DC

## 2020-06-08 ENCOUNTER — Inpatient Hospital Stay: Payer: Medicaid Other

## 2020-06-09 ENCOUNTER — Inpatient Hospital Stay: Payer: Medicaid Other

## 2020-06-13 ENCOUNTER — Other Ambulatory Visit: Payer: Self-pay

## 2020-06-13 ENCOUNTER — Inpatient Hospital Stay: Payer: Medicaid Other | Attending: Oncology

## 2020-06-13 DIAGNOSIS — C884 Extranodal marginal zone B-cell lymphoma of mucosa-associated lymphoid tissue [MALT-lymphoma]: Secondary | ICD-10-CM | POA: Insufficient documentation

## 2020-06-13 DIAGNOSIS — Z452 Encounter for adjustment and management of vascular access device: Secondary | ICD-10-CM | POA: Diagnosis not present

## 2020-06-13 DIAGNOSIS — Z95828 Presence of other vascular implants and grafts: Secondary | ICD-10-CM

## 2020-06-13 MED ORDER — HEPARIN SOD (PORK) LOCK FLUSH 100 UNIT/ML IV SOLN
INTRAVENOUS | Status: AC
  Filled 2020-06-13: qty 5

## 2020-06-13 MED ORDER — SODIUM CHLORIDE 0.9% FLUSH
10.0000 mL | Freq: Once | INTRAVENOUS | Status: AC
  Administered 2020-06-13: 10 mL via INTRAVENOUS
  Filled 2020-06-13: qty 10

## 2020-06-13 MED ORDER — HEPARIN SOD (PORK) LOCK FLUSH 100 UNIT/ML IV SOLN
500.0000 [IU] | Freq: Once | INTRAVENOUS | Status: AC
  Administered 2020-06-13: 500 [IU] via INTRAVENOUS
  Filled 2020-06-13: qty 5

## 2020-06-14 DIAGNOSIS — G894 Chronic pain syndrome: Secondary | ICD-10-CM | POA: Diagnosis not present

## 2020-06-14 DIAGNOSIS — G893 Neoplasm related pain (acute) (chronic): Secondary | ICD-10-CM | POA: Diagnosis not present

## 2020-06-14 DIAGNOSIS — M5414 Radiculopathy, thoracic region: Secondary | ICD-10-CM | POA: Diagnosis not present

## 2020-06-14 DIAGNOSIS — F3189 Other bipolar disorder: Secondary | ICD-10-CM | POA: Diagnosis not present

## 2020-06-14 DIAGNOSIS — M5442 Lumbago with sciatica, left side: Secondary | ICD-10-CM | POA: Diagnosis not present

## 2020-06-14 DIAGNOSIS — F4310 Post-traumatic stress disorder, unspecified: Secondary | ICD-10-CM | POA: Diagnosis not present

## 2020-06-14 DIAGNOSIS — I10 Essential (primary) hypertension: Secondary | ICD-10-CM | POA: Diagnosis not present

## 2020-06-14 DIAGNOSIS — F3181 Bipolar II disorder: Secondary | ICD-10-CM | POA: Diagnosis not present

## 2020-06-14 DIAGNOSIS — F320 Major depressive disorder, single episode, mild: Secondary | ICD-10-CM | POA: Diagnosis not present

## 2020-07-10 ENCOUNTER — Other Ambulatory Visit: Payer: Self-pay

## 2020-07-10 ENCOUNTER — Telehealth (INDEPENDENT_AMBULATORY_CARE_PROVIDER_SITE_OTHER): Payer: Medicaid Other | Admitting: Psychiatry

## 2020-07-10 ENCOUNTER — Encounter: Payer: Self-pay | Admitting: Psychiatry

## 2020-07-10 DIAGNOSIS — F319 Bipolar disorder, unspecified: Secondary | ICD-10-CM

## 2020-07-10 DIAGNOSIS — G2111 Neuroleptic induced parkinsonism: Secondary | ICD-10-CM | POA: Diagnosis not present

## 2020-07-10 DIAGNOSIS — F4310 Post-traumatic stress disorder, unspecified: Secondary | ICD-10-CM | POA: Diagnosis not present

## 2020-07-10 DIAGNOSIS — F3131 Bipolar disorder, current episode depressed, mild: Secondary | ICD-10-CM | POA: Insufficient documentation

## 2020-07-10 MED ORDER — LAMOTRIGINE 25 MG PO TABS: 25.0000 mg | ORAL_TABLET | Freq: Every day | ORAL | 1 refills | Status: DC

## 2020-07-10 MED ORDER — QUETIAPINE FUMARATE ER 300 MG PO TB24: 300.0000 mg | ORAL_TABLET | Freq: Every day | ORAL | 0 refills | Status: DC

## 2020-07-10 MED ORDER — BENZTROPINE MESYLATE 0.5 MG PO TABS: 0.5000 mg | ORAL_TABLET | Freq: Every day | ORAL | 1 refills | Status: DC | PRN

## 2020-07-10 NOTE — Patient Instructions (Signed)
Lamotrigine tablets What is this medicine? LAMOTRIGINE (la MOE Hendricks Limes) is used to control seizures in adults and children with epilepsy and Lennox-Gastaut syndrome. It is also used in adults to treat bipolar disorder. This medicine may be used for other purposes; ask your health care provider or pharmacist if you have questions. COMMON BRAND NAME(S): Lamictal, Subvenite What should I tell my health care provider before I take this medicine? They need to know if you have any of these conditions:  aseptic meningitis during prior use of lamotrigine  depression  folate deficiency  kidney disease  liver disease  suicidal thoughts, plans, or attempt; a previous suicide attempt by you or a family member  an unusual or allergic reaction to lamotrigine or other seizure medications, other medicines, foods, dyes, or preservatives  pregnant or trying to get pregnant  breast-feeding How should I use this medicine? Take this medicine by mouth with a glass of water. Follow the directions on the prescription label. Do not chew these tablets. If this medicine upsets your stomach, take it with food or milk. Take your doses at regular intervals. Do not take your medicine more often than directed. A special MedGuide will be given to you by the pharmacist with each new prescription and refill. Be sure to read this information carefully each time. Talk to your pediatrician regarding the use of this medicine in children. While this drug may be prescribed for children as young as 2 years for selected conditions, precautions do apply. Overdosage: If you think you have taken too much of this medicine contact a poison control center or emergency room at once. NOTE: This medicine is only for you. Do not share this medicine with others. What if I miss a dose? If you miss a dose, take it as soon as you can. If it is almost time for your next dose, take only that dose. Do not take double or extra doses. What may  interact with this medicine?  atazanavir  carbamazepine  female hormones, including contraceptive or birth control pills  lopinavir  methotrexate  phenobarbital  phenytoin  primidone  pyrimethamine  rifampin  ritonavir  trimethoprim  valproic acid This list may not describe all possible interactions. Give your health care provider a list of all the medicines, herbs, non-prescription drugs, or dietary supplements you use. Also tell them if you smoke, drink alcohol, or use illegal drugs. Some items may interact with your medicine. What should I watch for while using this medicine? Visit your doctor or health care provider for regular checks on your progress. If you take this medicine for seizures, wear a Medic Alert bracelet or necklace. Carry an identification card with information about your condition, medicines, and doctor or health care provider. It is important to take this medicine exactly as directed. When first starting treatment, your dose will need to be adjusted slowly. It may take weeks or months before your dose is stable. You should contact your doctor or health care provider if your seizures get worse or if you have any new types of seizures. Do not stop taking this medicine unless instructed by your doctor or health care provider. Stopping your medicine suddenly can increase your seizures or their severity. This medicine may cause serious skin reactions. They can happen weeks to months after starting the medicine. Contact your health care provider right away if you notice fevers or flu-like symptoms with a rash. The rash may be red or purple and then turn into blisters or peeling  of the skin. Or, you might notice a red rash with swelling of the face, lips or lymph nodes in your neck or under your arms. You may get drowsy, dizzy, or have blurred vision. Do not drive, use machinery, or do anything that needs mental alertness until you know how this medicine affects you. To  reduce dizzy or fainting spells, do not sit or stand up quickly, especially if you are an older patient. Alcohol can increase drowsiness and dizziness. Avoid alcoholic drinks. If you are taking this medicine for bipolar disorder, it is important to report any changes in your mood to your doctor or health care provider. If your condition gets worse, you get mentally depressed, feel very hyperactive or manic, have difficulty sleeping, or have thoughts of hurting yourself or committing suicide, you need to get help from your health care provider right away. If you are a caregiver for someone taking this medicine for bipolar disorder, you should also report these behavioral changes right away. The use of this medicine may increase the chance of suicidal thoughts or actions. Pay special attention to how you are responding while on this medicine. Your mouth may get dry. Chewing sugarless gum or sucking hard candy, and drinking plenty of water may help. Contact your doctor if the problem does not go away or is severe. Women who become pregnant while using this medicine may enroll in the [employer] [facility] Pregnancy Registry by calling [phone]. This registry collects information about the safety of antiepileptic drug use during pregnancy. This medicine may cause a decrease in folic acid. You should make sure that you get enough folic acid while you are taking this medicine. Discuss the foods you eat and the vitamins you take with your health care provider. What side effects may I notice from receiving this medicine? Side effects that you should report to your doctor or health care professional as soon as possible:  allergic reactions like skin rash, itching or hives, swelling of the face, lips, or tongue  changes in vision  depressed mood  elevated mood, decreased need for sleep, racing thoughts, impulsive behavior  loss of balance or coordination  mouth sores  rash, fever, and  swollen lymph nodes  redness, blistering, peeling or loosening of the skin, including inside the mouth  right upper belly pain  seizures  severe muscle pain  signs and symptoms of aseptic meningitis such as stiff neck and sensitivity to light, headache, drowsiness, fever, nausea, vomiting, rash  signs of infection - fever or chills, cough, sore throat, pain or difficulty passing urine  suicidal thoughts or other mood changes  swollen lymph nodes  trouble walking  unusual bruising or bleeding  unusually weak or tired  yellowing of the eyes or skin Side effects that usually do not require medical attention (report to your doctor or health care professional if they continue or are bothersome):  diarrhea  dizziness  dry mouth  stuffy nose  tiredness  tremors  trouble sleeping This list may not describe all possible side effects. Call your doctor for medical advice about side effects. You may report side effects to FDA at 1-800-FDA-1088. Where should I keep my medicine? Keep out of reach of children. Store at room temperature between 15 and 30 degrees C (59 and 86 degrees F). Throw away any unused medicine after the expiration date. NOTE: This sheet is a summary. It may not cover all possible information. If you have questions about this medicine, talk to your doctor,  pharmacist, or health care provider.  2020 Elsevier/Gold Standard (2018-12-25 15:03:40)

## 2020-07-10 NOTE — Progress Notes (Signed)
Provider Location : [facility] Patient Location : Home  Participants: Patient , Provider  Virtual Visit via Video Note  I connected with Alison [name] on 07/10/20 at 10:20 AM EDT by a video enabled telemedicine application and verified that I am speaking with the correct person using two identifiers.   I discussed the limitations of evaluation and management by telemedicine and the availability of in person appointments. The patient expressed understanding and agreed to proceed.   I discussed the assessment and treatment plan with the patient. The patient was provided an opportunity to ask questions and all were answered. The patient agreed with the plan and demonstrated an understanding of the instructions.   The patient was advised to call back or seek an in-person evaluation if the symptoms worsen or if the condition fails to improve as anticipated.   [place] MD OP Progress Note  07/10/2020 11:29 AM Alison [name]  MRN:  [id number]  Chief Complaint:  Chief Complaint    Follow-up     HPI: Alison [name] is a 44 year old Caucasian female, lives in [place] was evaluated by telemedicine today.  Patient with history of bipolar disorder, PTSD, multiple medical problems including fibromyalgia.  Patient today reports she is currently going through a flareup of fibromyalgia.  She has a lot of aches and pains.  She is currently on pain medication however her pain continues to be unmanageable.  She agrees to contact her provider who is managing it.  She reports this does have an impact on her mood and she does feel depressed often.  She also struggles with anhedonia.  She has not been doing much.  She however reports she was able to go to the zoo with her fianc last week.  She did have fun at that time.  Patient reports sleep is okay.  Patient denies any suicidality, homicidality or perceptual disturbances.  Patient is compliant on medications.  Denies side effects.  Patient denies any  other concerns today.  Visit Diagnosis:    ICD-10-CM   1. Bipolar 1 disorder, depressed (HCC)  F31.9 lamoTRIgine (LAMICTAL) 25 MG tablet   mild  2. PTSD (post-traumatic stress disorder)  F43.10 QUEtiapine (SEROQUEL XR) 300 MG 24 hr tablet   Mild  3. 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 Abnormal uterine bleeding   . Allergy   . Anxiety   . Arthritis   . Bipolar disorder (manic depression) ([employer])   . Chronic kidney failure   . Chronic renal disease, stage III ([place])   . Diabetes mellitus without complication ([place])   . 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  . FH: trigeminal neuralgia   . GERD (gastroesophageal reflux disease)   . Heart murmur   . Hypertension   . Kidney mass   . Lupus ([place])   . Lupus ([place])   . Major depressive disorder   . 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 Migraine   . Morbid obesity ([place])   . Neuromuscular disorder (HCC)    neuropathy  . Neuropathy   . Personality disorder ([place])   . Post herpetic neuralgia   . PTSD (post-traumatic stress disorder)   . Renal disorder   . S/P liver transplant ([place])  Past Surgical History:  Procedure Laterality Date  . BONE MARROW BIOPSY  01/13/2015  . BREAST BIOPSY  12/2014  . BREAST BIOPSY  2011  . BREAST SURGERY    . CHOLECYSTECTOMY    . HERNIA REPAIR    . infusaport    . LIVER TRANSPLANT  12/17/1991  . LUMBAR PUNCTURE    . PORT A CATH INJECTION ([place])    . tumor removal  2015    Family Psychiatric History: I have reviewed family psychiatric history from my progress note on 06/10/2019  Family History:  Family History  Problem Relation Age of Onset  . Heart disease Mother   . Hypertension Mother   .  Cancer - Other Mother   . Bipolar disorder Mother   . Heart disease Father   . Hypertension Father   . Diabetes Father   . Parkinson's disease Maternal Grandmother   . Cancer Maternal Aunt   . Cancer Maternal Uncle   . Cancer Maternal Grandfather   . Lupus Paternal Grandmother   . Hypertension Brother     Social History: Reviewed social history from my progress note on 06/10/2019 Social History   Socioeconomic History  . Marital status: Single    Spouse name: Not on file  . Number of children: 0  . Years of education: 9  . Highest education level: GED or equivalent  Occupational History  . Occupation: disabled  Tobacco Use  . Smoking status: Never Smoker  . Smokeless tobacco: Never Used  Vaping Use  . Vaping Use: Never used  Substance and Sexual Activity  . Alcohol use: Not Currently  . Drug use: Not Currently    Types: Marijuana    Comment: pain managment last used in early april  . Sexual activity: Not Currently  Other Topics Concern  . Not on file  Social History Narrative  . Not on file   Social Determinants of Health   Financial Resource Strain:   . Difficulty of Paying Living Expenses: Not on file  Food Insecurity:   . Worried About Charity fundraiser in the Last Year: Not on file  . Ran Out of Food in the Last Year: Not on file  Transportation Needs:   . Lack of Transportation (Medical): Not on file  . Lack of Transportation (Non-Medical): Not on file  Physical Activity:   . Days of Exercise per Week: Not on file  . Minutes of Exercise per Session: Not on file  Stress:   . Feeling of Stress : Not on file  Social Connections:   . Frequency of Communication with Friends and Family: Not on file  . Frequency of Social Gatherings with Friends and Family: Not on file  . Attends Religious Services: Not on file  . Active Member of Clubs or Organizations: Not on file  . Attends Archivist Meetings: Not on file  . Marital Status: Not on file     Allergies:  Allergies  Allergen Reactions  . Penicillin G Itching, Rash, Anaphylaxis and Palpitations  . Carbamazepine Other (See Comments)    Medication interaction-prograf  . Hydrocodone-Acetaminophen Itching  . Naproxen Itching    Metabolic Disorder Labs: Lab Results  Component Value Date   HGBA1C 8.9 (H) 06/05/2020   MPG 209 06/05/2020   Lab Results  Component Value Date   PROLACTIN 13.4 07/15/2019   PROLACTIN 21.3 05/24/2019   Lab Results  Component Value Date   CHOL 193 06/05/2020   TRIG 348 (H) 06/05/2020  HDL 36 (L) 06/05/2020   CHOLHDL 5.4 (H) 06/05/2020   LDLCALC 111 (H) 06/05/2020   Lab Results  Component Value Date   TSH 2.650 07/15/2019   TSH 4.650 (H) 05/24/2019    Therapeutic Level Labs: No results found for: LITHIUM No results found for: VALPROATE No components found for:  CBMZ  Current Medications: Current Outpatient Medications  Medication Sig Dispense Refill  . vitamin B-12 (CYANOCOBALAMIN) 1000 MCG tablet Take 1,000 mcg by mouth daily. Takes 5000 mcg    . baclofen (LIORESAL) 20 MG tablet Take 1 tablet (20 mg total) by mouth 3 (three) times daily as needed for muscle spasms. 90 each 2  . benztropine (COGENTIN) 0.5 MG tablet Take 1 tablet (0.5 mg total) by mouth daily as needed for tremors. 30 tablet 1  . colchicine 0.6 MG tablet Take 0.6 mg by mouth 2 (two) times daily. Takes once daily    . Cranberry 500 MG TABS Take by mouth. (Patient not taking: Reported on 06/05/2020)    . DULoxetine (CYMBALTA) 30 MG capsule Take 1 capsule (30 mg total) by mouth daily. To be combined with 60 mg daily 90 capsule 0  . DULoxetine (CYMBALTA) 60 MG capsule Take 1 capsule (60 mg total) by mouth daily. To be combined with 30 mg daily 90 capsule 0  . glucose blood (PRECISION QID TEST) test strip Use once daily Use as instructed.    . hydrOXYzine (VISTARIL) 25 MG capsule Take 1 capsule (25 mg total) by mouth 2 (two) times daily as needed. For sever anxiety  attacks 60 capsule 1  . lamoTRIgine (LAMICTAL) 25 MG tablet Take 1 tablet (25 mg total) by mouth daily. 30 tablet 1  . Lidocaine 0.5 % AERO Apply topically.    . liraglutide (VICTOZA) 18 MG/3ML SOPN Inject into the skin.    [name] lisinopril (ZESTRIL) 20 MG tablet Take 1 tablet (20 mg total) by mouth daily. 90 tablet 3  . Magnesium Bisglycinate (MAG GLYCINATE) 100 MG TABS Take by mouth.    . magnesium oxide (MAG-OX) 400 MG tablet Take by mouth.    . Melatonin 5 MG CAPS Take 1-2 capsules (5-10 mg total) by mouth at bedtime. 100 capsule 0  . metFORMIN (GLUCOPHAGE) 1000 MG tablet Take 1 tablet by mouth 2 (two) times a day.    . pregabalin (LYRICA) 200 MG capsule Take 1 capsule (200 mg total) by mouth 3 (three) times daily. 90 capsule 2  . promethazine (PHENERGAN) 25 MG tablet Take 1 tablet by mouth as needed. (Patient not taking: Reported on 06/06/2020)    . QUEtiapine (SEROQUEL XR) 300 MG 24 hr tablet Take 1 tablet (300 mg total) by mouth at bedtime. 90 tablet 0  . Specialty Vitamins Products (MAGNESIUM, AMINO ACID CHELATE,) 133 MG tablet Take 1 tablet by mouth 2 (two) times daily.    . SUMAtriptan (IMITREX) 100 MG tablet TAKE 1 TABLET BY MOUTH ONCE AS NEEDED FOR MIGRAINE. MAY TAKE A SECOND DOSE AFTER 2 HOURS IF NEEDED    . tacrolimus (PROGRAF) 1 MG capsule Take 1 capsule by mouth once daily 30 capsule 0  . XTAMPZA ER 9 MG C12A Take 1 capsule by mouth every 12 (twelve) hours.     No current facility-administered medications for this visit.     Musculoskeletal: Strength & Muscle Tone: UTA Gait & Station: normal Patient leans: N/A  Psychiatric Specialty Exam: Review of Systems  Constitutional: Positive for fatigue.  Musculoskeletal: Positive for myalgias.  Psychiatric/Behavioral: Positive for dysphoric  mood.  All other systems reviewed and are negative.   Last menstrual period 02/04/2018.There is no height or weight on file to calculate BMI.  General Appearance: Casual  Eye Contact:  Fair   Speech:  Clear and Coherent  Volume:  Normal  Mood:  Dysphoric  Affect:  Congruent  Thought Process:  Goal Directed and Descriptions of Associations: Intact  Orientation:  Full (Time, Place, and Person)  Thought Content: Logical   Suicidal Thoughts:  No  Homicidal Thoughts:  No  Memory:  Immediate;   Fair Recent;   Fair Remote;   Fair  Judgement:  Fair  Insight:  Fair  Psychomotor Activity:  Normal  Concentration:  Concentration: Fair and Attention Span: Fair  Recall:  [employer] of Knowledge: Fair  Language: Fair  Akathisia:  No  Handed:  Right  AIMS (if indicated): UTA  Assets:  Communication Skills Desire for Improvement Housing Intimacy  ADL's:  Intact  Cognition: WNL  Sleep:  Fair   Screenings: PHQ2-9     Office Visit from 06/05/2020 in [facility] Nutrition from 09/23/2019 in [facility] Office Visit from 09/16/2019 in [facility] Management from 08/10/2019 in [facility] Office Visit from 07/30/2019 in [facility]  PHQ-2 Total Score _0 0  PHQ-9 Total Score _1 0       Assessment and Plan: Bellamie Turney is a 44 year old Caucasian female on disability, has a history of bipolar disorder, PTSD, history of borderline personality disorder, chronic pain, fibromyalgia, trigeminal neuralgia, history of liver transplant, B-cell lymphoma, SLE, diabetes melitis, hypertension, OSA, migraine headaches was evaluated by telemedicine today.  Patient with psychosocial stressors of multiple health issues the current pandemic is currently struggling with a fibromyalgia flareup which does have an impact on her mood.  Discussed plan as noted below.  Plan Bipolar disorder depressed-mild-unstable Add Lamictal 25 mg p.o. daily. Provided education and discussed the risk of Stevens-Johnson syndrome. Seroquel extended release 300 mg p.o. nightly Cymbalta 90 mg p.o.  daily for her mood.   PTSD-stable Cymbalta 90 mg p.o. daily Hydroxyzine 25 mg p.o. 3 times daily as needed for anxiety attacks Melatonin 5 to 10 mg p.o. nightly Continue CBT with therapist increase:  Neuroleptic induced Parkinson's disease-stable Cogentin 0.5 mg p.o. daily as needed only.  Provided supportive psychotherapy.  Discussed with patient to contact her pain provider for further management of her fibromyalgia exacerbation.  Follow-up in clinic in 4 weeks or sooner if needed.  I have spent atleast 20 minutes face to face by video with patient today. More than 50 % of the time was spent for preparing to see the patient ( e.g., review of test, records ), obtaining and to review and separately obtained history , ordering medications and test ,psychoeducation and supportive psychotherapy and care coordination,as well as documenting clinical information in electronic health record,interpreting and communication of test results This note was generated in part or whole with voice recognition software. Voice recognition is usually quite accurate but there are transcription errors that can and very often do occur. I apologize for any typographical errors that were not detected and corrected.     [name], MD 07/10/2020, 11:29 AM

## 2020-07-14 DIAGNOSIS — F3181 Bipolar II disorder: Secondary | ICD-10-CM | POA: Diagnosis not present

## 2020-07-14 DIAGNOSIS — F4310 Post-traumatic stress disorder, unspecified: Secondary | ICD-10-CM | POA: Diagnosis not present

## 2020-07-19 DIAGNOSIS — F320 Major depressive disorder, single episode, mild: Secondary | ICD-10-CM | POA: Diagnosis not present

## 2020-07-19 DIAGNOSIS — G893 Neoplasm related pain (acute) (chronic): Secondary | ICD-10-CM | POA: Diagnosis not present

## 2020-07-19 DIAGNOSIS — I10 Essential (primary) hypertension: Secondary | ICD-10-CM | POA: Diagnosis not present

## 2020-07-19 DIAGNOSIS — M5414 Radiculopathy, thoracic region: Secondary | ICD-10-CM | POA: Diagnosis not present

## 2020-07-19 DIAGNOSIS — G894 Chronic pain syndrome: Secondary | ICD-10-CM | POA: Diagnosis not present

## 2020-07-19 DIAGNOSIS — F3189 Other bipolar disorder: Secondary | ICD-10-CM | POA: Diagnosis not present

## 2020-07-19 DIAGNOSIS — M5442 Lumbago with sciatica, left side: Secondary | ICD-10-CM | POA: Diagnosis not present

## 2020-07-24 NOTE — Progress Notes (Signed)
Patient ID: Alison [name], female    DOB: [birth year], 44 y.o.   MRN: [id number]  PCP: Towanda Malkin, MD  Chief Complaint  Patient presents with  . Abdominal Pain    Subjective:   Alison [name] is a 44 y.o. female, presents to clinic with CC of the following:  Chief Complaint  Patient presents with  . Abdominal Pain    HPI:  Patient is a 44 year old former patient of Raelyn Ensign Last visit with me was August 2021 Follows up today with abdominal pain  She has a complex past medical history including a prior liver transplant and post transplant lymphoma, still seeing Dr. Janese Banks and Dr. Allen Norris for follow-up, and treated by Dr. Shea Evans for bipolar/PTSD with last visit 07/10/2020, also sees chronic pain with trigeminal neuralgia concerns, in addition to the other issues noted upon review in my last note  She notes for the past month, she has had intermittent pain is more in her left suprapubic region and in her abdomen.  She had no trauma before this came on.  Is not bothersome today.  She feels the discomfort in the left suprapubic area with no swelling noted, no change in bowel habits, including no diarrhea or constipation, denies any dark or black stools, no bleeding per rectum.  Has had no fevers or nausea or vomiting.  She has not had a period in years, although has a history of ovarian cyst, that just went away with no treatment initiated for that previously.  She is followed by [facility], and is due for a visit with them in a couple months.  No recent vaginal symptoms of concern. She has no inguinal hernia history, although has an umbilical hernia history. 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  Denies any dysuria, no hematuria.  No flank pains.      Patient Active Problem List   Diagnosis Date Noted  .  Bipolar 1 disorder, depressed ([place]) 07/10/2020  . 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 Edema of lower extremity 09/16/2019  . Carpal tunnel syndrome, left 07/26/2019  . Cubital tunnel syndrome on left 07/26/2019  . Bipolar I disorder, most recent episode depressed ([place]) 07/12/2019  . PTSD (post-traumatic stress disorder) 07/12/2019  . Umbilical hernia without obstruction and without gangrene 06/17/2019  . Encounter for surveillance of abnormal nevi 06/17/2019  . Pineal gland cyst 06/17/2019  . Chronic hip pain, left 06/02/2019  . Lumbar spondylosis 06/02/2019  . Mouth dryness 06/02/2019  . Obesity (BMI 35.0-39.9 without comorbidity) 06/02/2019  . Goals of care, counseling/discussion 05/18/2019  . 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[place]) 05/18/2019  . Occipital neuralgia 05/13/2019  . Plantar fasciitis of left foot 04/27/2019  . Fibromyalgia 03/23/2019  . Systemic lupus erythematosus (SLE) in adult ([place]) 03/23/2019  . Essential hypertension 03/23/2019  . Hepatitis 03/23/2019  . Mass of left kidney 03/23/2019  . Diabetes mellitus ([place]) 03/23/2019  . Nausea without vomiting 03/23/2019  . Neuropathy 03/23/2019  . Cancer ([place]) 03/23/2019  . Hyperlipidemia 03/23/2019  . Osteoarthritis 03/23/2019  . Trigeminal neuralgia 03/23/2019  . Chronic renal disease, stage III ([place]) 03/23/2019  . Nephrolithiasis 03/23/2019  . Migraine 03/23/2019  . OSA on CPAP 03/23/2019  . Fibrocystic breast 03/23/2019  . Heart murmur 03/23/2019  .  Bipolar disorder, in partial remission, most recent episode depressed ([place]) 07/02/2017  . Abnormal uterine bleeding 03/18/2017  . Major depressive disorder, recurrent ([place]) 03/18/2017  . Morbid obesity ([place]) 03/18/2017  . Secondary hyperparathyroidism ([place]) 02/07/2017  . Neuropathic pain 11/19/2016  . Post herpetic neuralgia 11/19/2016  . 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 Lumbar disc disease with radiculopathy 07/26/2013  . S/P liver transplant ([place]) 07/26/2013  . Type 2 diabetes mellitus, uncontrolled ([place]) 07/26/2013  . Facial nerve disorder 06/29/2012  . Low back pain 12/26/2010      Current Outpatient Medications:  .  baclofen (LIORESAL) 20 MG tablet, Take 1 tablet (20 mg total) by mouth 3 (three) times daily as needed for muscle spasms., Disp: 90 each, Rfl: 2 .  benztropine (COGENTIN) 0.5 MG tablet, Take 1 tablet (0.5 mg total) by mouth daily as needed for tremors., Disp: 30 tablet, Rfl: 1 .  colchicine 0.6 MG tablet, Take 0.6 mg by mouth 2 (two) times daily. Takes once daily, Disp: , Rfl:  .  Cranberry 500 MG TABS, Take by mouth. , Disp: , Rfl:  .  DULoxetine (CYMBALTA) 30 MG capsule, Take 1 capsule (30 mg total) by mouth daily. To be combined with 60 mg daily, Disp: 90 capsule, Rfl: 0 .  DULoxetine (CYMBALTA) 60 MG capsule, Take 1 capsule (60 mg total) by mouth daily. To be combined with 30 mg daily, Disp: 90 capsule, Rfl: 0 .  glucose blood (PRECISION QID TEST) test strip, Use once daily Use as instructed., Disp: , Rfl:  .  hydrOXYzine (VISTARIL) 25 MG capsule, Take 1 capsule (25 mg total) by mouth 2 (two) times daily as needed. For sever anxiety attacks, Disp: 60 capsule, Rfl: 1 .  Lactobacillus (PROBIOTIC ACIDOPHILUS PO), Take by mouth., Disp: , Rfl:  .  lamoTRIgine (LAMICTAL) 25 MG tablet, Take 1 tablet (25 mg total) by mouth daily., Disp: 30 tablet, Rfl: 1 .  Lidocaine 0.5 % AERO, Apply topically., Disp: , Rfl:  .  liraglutide (VICTOZA) 18 MG/3ML SOPN, Inject into the skin., Disp: , Rfl:  .  lisinopril (ZESTRIL) 20 MG tablet, Take 1 tablet (20 mg total) by mouth daily., Disp: 90 tablet, Rfl: 3 .  Magnesium Bisglycinate (MAG GLYCINATE) 100 MG TABS, Take by mouth., Disp: , Rfl:  .  magnesium oxide (MAG-OX) 400 MG tablet, Take by mouth., Disp: , Rfl:  .  Melatonin 5 MG CAPS, Take 1-2 capsules (5-10  mg total) by mouth at bedtime., Disp: 100 capsule, Rfl: 0 .  metFORMIN (GLUCOPHAGE) 1000 MG tablet, Take 1 tablet by mouth 2 (two) times a day., Disp: , Rfl:  .  promethazine (PHENERGAN) 25 MG tablet, Take 1 tablet by mouth as needed. , Disp: , Rfl:  .  QUEtiapine (SEROQUEL XR) 300 MG 24 hr tablet, Take 1 tablet (300 mg total) by mouth at bedtime., Disp: 90 tablet, Rfl: 0 .  Specialty Vitamins Products (MAGNESIUM, AMINO ACID CHELATE,) 133 MG tablet, Take 1 tablet by mouth 2 (two) times daily., Disp: , Rfl:  .  SUMAtriptan (IMITREX) 100 MG tablet, TAKE 1 TABLET BY MOUTH ONCE AS NEEDED FOR MIGRAINE. MAY TAKE A SECOND DOSE AFTER 2 HOURS IF NEEDED, Disp: , Rfl:  .  tacrolimus (PROGRAF) 1 MG capsule, Take 1 capsule by mouth once daily, Disp: 30 capsule, Rfl: 0 .  vitamin B-12 (CYANOCOBALAMIN) 1000 MCG tablet, Take 1,000 mcg by mouth daily. Takes 5000 mcg, Disp: , Rfl:  .  XTAMPZA ER 9  MG C12A, Take 1 capsule by mouth every 12 (twelve) hours., Disp: , Rfl:  .  pregabalin (LYRICA) 200 MG capsule, Take 1 capsule (200 mg total) by mouth 3 (three) times daily., Disp: 90 capsule, Rfl: 2   Allergies  Allergen Reactions  . Penicillin G Itching, Rash, Anaphylaxis and Palpitations  . Carbamazepine Other (See Comments)    Medication interaction-prograf  . Hydrocodone-Acetaminophen Itching  . Naproxen Itching     Past Surgical History:  Procedure Laterality Date  . BONE MARROW BIOPSY  01/13/2015  . BREAST BIOPSY  12/2014  . BREAST BIOPSY  2011  . BREAST SURGERY    . CHOLECYSTECTOMY    . HERNIA REPAIR    . infusaport    . LIVER TRANSPLANT  12/17/1991  . LUMBAR PUNCTURE    . PORT A CATH INJECTION ([place] [facility])    . tumor removal  2015     Family History  Problem Relation Age of Onset  . Heart disease Mother   . Hypertension Mother   . Cancer - Other Mother   . Bipolar disorder Mother   . Heart disease Father   . Hypertension Father   . Diabetes Father   . Parkinson's disease Maternal  Grandmother   . Cancer Maternal Aunt   . Cancer Maternal Uncle   . Cancer Maternal Grandfather   . Lupus Paternal Grandmother   . Hypertension Brother      Social History   Tobacco Use  . Smoking status: Never Smoker  . Smokeless tobacco: Never Used  Substance Use Topics  . Alcohol use: Not Currently    With staff assistance, above reviewed with the patient today.  ROS: As per HPI, otherwise no specific complaints on a limited and focused system review   No results found for this or any previous visit (from the past 72 hour(s)).   PHQ2/9: Depression screen [facility] 2/9 07/25/2020 06/05/2020 09/23/2019 09/16/2019 08/10/2019  Decreased Interest 3 3 3 1 1   Down, Depressed, Hopeless 3 3 3 1 3   PHQ - 2 Score 6 6 6 2 4   Altered sleeping 3 3 3  0 0  Tired, decreased energy 2 3 3 1  0  Change in appetite 2 2 3 1 1   Feeling bad or failure about yourself  2 1 0 0 0  Trouble concentrating 3 3 2 1  0  Moving slowly or fidgety/restless 0 0 2 0 0  Suicidal thoughts 0 0 0 0 -  PHQ-9 Score 18 18 19 5 5   Difficult doing work/chores Very difficult Very difficult - Not difficult at all -  Some recent data might be hidden   PHQ-2/9 Result reviewed, continues to follow with Dr. Budd Palmer, psychiatry  Fall Risk: Fall Risk  07/25/2020 06/05/2020 09/23/2019 09/16/2019 07/30/2019  Falls in the past year? 0 1 1 0 0  Number falls in past yr: 0 1 1 0 0  Comment - 6 - - -  Injury with Fall? 0 1 0 0 0  Risk for fall due to : - - History of fall(s) - -  Follow up - Falls evaluation completed Falls prevention discussed Falls evaluation completed Falls evaluation completed      Objective:   Vitals:   07/25/20 1324  BP: 120/76  Pulse: 97  Resp: 16  Temp: 98.3 F (36.8 C)  TempSrc: Oral  SpO2: 97%  Weight: 269 lb (122 kg)  Height: 5' 6"  (1.676 m)    Body mass index is 43.42 kg/m.  Physical Exam  NAD, masked, pleasant, not ill-appearing HEENT - [place]/AT, sclera anicteric, PERRL, EOMI, conj -  non-inj'ed,  pharynx clear Neck - supple, no adenopathy, no rigidity Car - RRR without m/g/r, not tachycardic Pulm- RR and effort normal at rest, CTA without wheeze or rales Abd - soft, obese, NT diffusely, ND, positive bowel sounds, no obvious masses, nontender in the left suprapubic area below the waistline towards the inguinal ligament where she feels her discomfort.  No suprapubic swelling, No pain with bringing her knee to her chest on the left while supine, nor with straight leg elevation. Back - no CVA tenderness Neuro/psychiatric - affect was not flat, appropriate with conversation             Alert with normal speech  Results for orders placed or performed in visit on 06/05/20  COMPLETE METABOLIC PANEL WITH GFR  Result Value Ref Range   Glucose, Bld 208 (H) 65 - 99 mg/dL   BUN 17 7 - 25 mg/dL   Creat 1.02 0.50 - 1.10 mg/dL   GFR, Est Non African American 66 > OR = 60 mL/min/1.24m   GFR, Est African American 76 > OR = 60 mL/min/1.760m  BUN/Creatinine Ratio NOT APPLICABLE 6 - 22 (calc)   Sodium 136 135 - 146 mmol/L   Potassium 4.3 3.5 - 5.3 mmol/L   Chloride 100 98 - 110 mmol/L   CO2 22 20 - 32 mmol/L   Calcium 9.3 8.6 - 10.2 mg/dL   Total Protein 6.8 6.1 - 8.1 g/dL   Albumin 4.0 3.6 - 5.1 g/dL   Globulin 2.8 1.9 - 3.7 g/dL (calc)   AG Ratio 1.4 1.0 - 2.5 (calc)   Total Bilirubin 0.6 0.2 - 1.2 mg/dL   Alkaline phosphatase (APISO) 71 31 - 125 U/L   AST 55 (H) 10 - 35 U/L   ALT 44 (H) 6 - 29 U/L  Hemoglobin A1c  Result Value Ref Range   Hgb A1c MFr Bld 8.9 (H) <5.7 % of total Hgb   Mean Plasma Glucose 209 (calc)   eAG (mmol/L) 11.6 (calc)  Lipid panel  Result Value Ref Range   Cholesterol 193 <200 mg/dL   HDL 36 (L) > OR = 50 mg/dL   Triglycerides 348 (H) <150 mg/dL   LDL Cholesterol (Calc) 111 (H) mg/dL (calc)   Total CHOL/HDL Ratio 5.4 (H) <5.0 (calc)   Non-HDL Cholesterol (Calc) 157 (H) <130 mg/dL (calc)  CBC with Differential/Platelet  Result Value Ref Range     WBC 4.5 3.8 - 10.8 Thousand/uL   RBC 4.33 3.80 - 5.10 Million/uL   Hemoglobin 12.9 11.7 - 15.5 g/dL   HCT 39.0 35 - 45 %   MCV 90.1 80.0 - 100.0 fL   MCH 29.8 27.0 - 33.0 pg   MCHC 33.1 32.0 - 36.0 g/dL   RDW 13.7 11.0 - 15.0 %   Platelets 132 (L) 140 - 400 Thousand/uL   MPV 11.0 7.5 - 12.5 fL   Neutro Abs 2,273 1,500 - 7,800 cells/uL   Lymphs Abs 1,184 850 - 3,900 cells/uL   Absolute Monocytes 495 200 - 950 cells/uL   Eosinophils Absolute 509 (H) 15.0 - 500.0 cells/uL   Basophils Absolute 41 0.0 - 200.0 cells/uL   Neutrophils Relative % 50.5 %   Total Lymphocyte 26.3 %   Monocytes Relative 11.0 %   Eosinophils Relative 11.3 %   Basophils Relative 0.9 %  Microalbumin / creatinine urine ratio  Result Value Ref Range   Creatinine,  Urine 113 20 - 275 mg/dL   Microalb, Ur 18.7 mg/dL   Microalb Creat Ratio 165 (H) <30 mcg/mg creat   Urine dip showed mild protein, was otherwise negative with no infectious concerns    Assessment & Plan:   1. Bipolar I disorder, most recent episode depressed ([place]) PHQ-9 reviewed, continues to see Dr.[name] from psychiatry presently  2. Suprapubic pain Exact source unclear, and does not involve the left lower quadrant of the abdomen.  She has a history of an ovarian cyst, and question if this is a possible source.  No obvious hernia on exam, although has a history of hernia in her abdomen.  No change in bowel habits of concern.  She was asymptomatic today on exam. Did feel best getting a urine, and a urine dip was negative for any infectious concerns, did have some mild protein. Felt best to monitor presently, and noted if symptoms return, she should call her OB/GYN group that has been following her, and schedule an appointment sooner than the planned follow-up in a couple months time.  She was in agreement with this.  - POCT urinalysis dipstick  3. Need for immunization against influenza  - Flu Vaccine QUAD 36+ mos IM   Follow-up here as  needed until our plan follow-up scheduled for December.    Towanda Malkin, MD 07/25/20 1:46 PM

## 2020-07-25 ENCOUNTER — Other Ambulatory Visit: Payer: Self-pay

## 2020-07-25 ENCOUNTER — Ambulatory Visit: Payer: Medicaid Other | Admitting: Internal Medicine

## 2020-07-25 ENCOUNTER — Encounter: Payer: Self-pay | Admitting: Internal Medicine

## 2020-07-25 VITALS — BP 120/76 | HR 97 | Temp 98.3°F | Resp 16 | Ht 66.0 in | Wt 269.0 lb

## 2020-07-25 DIAGNOSIS — R102 Pelvic and perineal pain: Secondary | ICD-10-CM | POA: Diagnosis not present

## 2020-07-25 DIAGNOSIS — Z23 Encounter for immunization: Secondary | ICD-10-CM | POA: Diagnosis not present

## 2020-07-25 DIAGNOSIS — F3130 Bipolar disorder, current episode depressed, mild or moderate severity, unspecified: Secondary | ICD-10-CM

## 2020-07-25 LAB — POCT URINALYSIS DIPSTICK
Appearance: ABNORMAL
Bilirubin, UA: NEGATIVE
Blood, UA: NEGATIVE
Glucose, UA: NEGATIVE
Ketones, UA: NEGATIVE
Leukocytes, UA: NEGATIVE
Nitrite, UA: NEGATIVE
Odor: NORMAL
Protein, UA: POSITIVE — AB
Spec Grav, UA: 1.02
Urobilinogen, UA: 0.2 U/dL
pH, UA: 7

## 2020-07-25 NOTE — Patient Instructions (Signed)
If symptoms return, please call the OB/GYN group that you follow with to schedule an appointment.

## 2020-08-10 ENCOUNTER — Encounter: Payer: Self-pay | Admitting: Psychiatry

## 2020-08-10 ENCOUNTER — Telehealth (INDEPENDENT_AMBULATORY_CARE_PROVIDER_SITE_OTHER): Payer: Medicaid Other | Admitting: Psychiatry

## 2020-08-10 ENCOUNTER — Other Ambulatory Visit: Payer: Self-pay

## 2020-08-10 DIAGNOSIS — F4310 Post-traumatic stress disorder, unspecified: Secondary | ICD-10-CM | POA: Diagnosis not present

## 2020-08-10 DIAGNOSIS — F3176 Bipolar disorder, in full remission, most recent episode depressed: Secondary | ICD-10-CM

## 2020-08-10 DIAGNOSIS — F319 Bipolar disorder, unspecified: Secondary | ICD-10-CM

## 2020-08-10 DIAGNOSIS — G2111 Neuroleptic induced parkinsonism: Secondary | ICD-10-CM

## 2020-08-10 MED ORDER — LAMOTRIGINE 25 MG PO TABS: 25.0000 mg | ORAL_TABLET | Freq: Every day | ORAL | 0 refills | Status: DC

## 2020-08-10 MED ORDER — DULOXETINE HCL 30 MG PO CPEP: 30.0000 mg | ORAL_CAPSULE | Freq: Every day | ORAL | 0 refills | Status: DC

## 2020-08-10 MED ORDER — DULOXETINE HCL 60 MG PO CPEP: 60.0000 mg | ORAL_CAPSULE | Freq: Every day | ORAL | 0 refills | Status: DC

## 2020-08-10 NOTE — Progress Notes (Signed)
Virtual Visit via Video Note  I connected with Alison [name] on 08/10/20 at  4:00 PM EDT by a video enabled telemedicine application and verified that I am speaking with the correct person using two identifiers.  Location Provider Location : [facility] Patient Location : Home  Participants: Patient , Provider   I discussed the limitations of evaluation and management by telemedicine and the availability of in person appointments. The patient expressed understanding and agreed to proceed.     I discussed the assessment and treatment plan with the patient. The patient was provided an opportunity to ask questions and all were answered. The patient agreed with the plan and demonstrated an understanding of the instructions.   The patient was advised to call back or seek an in-person evaluation if the symptoms worsen or if the condition fails to improve as anticipated.   [place] MD OP Progress Note  08/10/2020 4:24 PM Alison [name]  MRN:  [id number]  Chief Complaint:  Chief Complaint    Follow-up     HPI: Alison [name] is a 44 year old Caucasian female, lives in [place], has a history of bipolar disorder, PTSD, multiple medical problems including fibromyalgia was evaluated by telemedicine today.  Patient today reports she is currently spending time with her sister who came to visit.  She is having fun.  She reports since the weather is getting colder she is going through a mild flareup of fibromyalgia however she is coping with it okay so far.  She reports mood wise she is doing okay.  She denies any significant depression or anxiety.  She is tolerating the Lamictal well and denies side effects.  She is sleeping well.  She is compliant with all medications and denies side effects.  Patient denies any suicidality, homicidality or perceptual disturbances.  Visit Diagnosis:    ICD-10-CM   1. Bipolar disorder, in full remission, most recent episode depressed (HCC)  F31.76  lamoTRIgine (LAMICTAL) 25 MG tablet   mild  2. PTSD (post-traumatic stress disorder)  F43.10 DULoxetine (CYMBALTA) 30 MG capsule    DULoxetine (CYMBALTA) 60 MG capsule  3. 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 Abnormal uterine bleeding   . Allergy   . Anxiety   . Arthritis   . Bipolar disorder (manic depression) ([place])   . Chronic kidney failure   . Chronic renal disease, stage III ([place])   . Diabetes mellitus without complication ([place])   . 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  . FH: trigeminal neuralgia   . GERD (gastroesophageal reflux disease)   . Heart murmur   . Hypertension   . Kidney mass   . Lupus ([place])   . Lupus ([place])   . Major depressive disorder   . 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 Migraine   . Morbid obesity ([place])   . Neuromuscular disorder (HCC)    neuropathy  . Neuropathy   . Personality disorder ([place])   . Post herpetic neuralgia   . PTSD (post-traumatic stress disorder)   . Renal disorder   . S/P liver transplant [facility])     Past Surgical History:  Procedure Laterality Date  . BONE MARROW BIOPSY  01/13/2015  . BREAST BIOPSY  12/2014  . BREAST BIOPSY  2011  . BREAST SURGERY    . CHOLECYSTECTOMY    . HERNIA REPAIR    . infusaport    . LIVER TRANSPLANT  12/17/1991  . LUMBAR PUNCTURE    . PORT A CATH INJECTION ([place] [facility])    . tumor removal  2015    Family Psychiatric History: I have reviewed family psychiatric history from my progress note from 06/10/2019  Family History:  Family History  Problem Relation Age of Onset  . Heart disease Mother   . Hypertension Mother   . Cancer - Other Mother   . Bipolar disorder Mother   . Heart disease Father   . Hypertension Father   . Diabetes  Father   . Parkinson's disease Maternal Grandmother   . Cancer Maternal Aunt   . Cancer Maternal Uncle   . Cancer Maternal Grandfather   . Lupus Paternal Grandmother   . Hypertension Brother     Social History: I have reviewed social history from my progress note on 06/10/2019 Social History   Socioeconomic History  . Marital status: Single    Spouse name: Not on file  . Number of children: 0  . Years of education: 9  . Highest education level: GED or equivalent  Occupational History  . Occupation: disabled  Tobacco Use  . Smoking status: Never Smoker  . Smokeless tobacco: Never Used  Vaping Use  . Vaping Use: Never used  Substance and Sexual Activity  . Alcohol use: Not Currently  . Drug use: Not Currently    Types: Marijuana    Comment: pain managment last used in early april  . Sexual activity: Not Currently  Other Topics Concern  . Not on file  Social History Narrative  . Not on file   Social Determinants of Health   Financial Resource Strain:   . Difficulty of Paying Living Expenses: Not on file  Food Insecurity:   . Worried About Charity fundraiser in the Last Year: Not on file  . Ran Out of Food in the Last Year: Not on file  Transportation Needs:   . Lack of Transportation (Medical): Not on file  . Lack of Transportation (Non-Medical): Not on file  Physical Activity:   . Days of Exercise per Week: Not on file  . Minutes of Exercise per Session: Not on file  Stress:   . Feeling of Stress : Not on file  Social Connections:   . Frequency of Communication with Friends and Family: Not on file  . Frequency of Social Gatherings with Friends and Family: Not on file  . Attends Religious Services: Not on file  . Active Member of Clubs or Organizations: Not on file  . Attends Archivist Meetings: Not on file  . Marital Status: Not on file    Allergies:  Allergies  Allergen Reactions  . Penicillin G Itching, Rash, Anaphylaxis and Palpitations  .  Carbamazepine Other (See Comments)    Medication interaction-prograf  . Hydrocodone-Acetaminophen Itching  . Naproxen Itching    Metabolic Disorder Labs: Lab Results  Component Value Date   HGBA1C 8.9 (H) 06/05/2020   MPG 209 06/05/2020   Lab Results  Component Value Date   PROLACTIN 13.4 07/15/2019   PROLACTIN 21.3 05/24/2019   Lab Results  Component Value Date   CHOL 193 06/05/2020   TRIG 348 (H) 06/05/2020   HDL 36 (L) 06/05/2020   CHOLHDL 5.4 (H) 06/05/2020   LDLCALC 111 (H) 06/05/2020   Lab Results  Component  Value Date   TSH 2.650 07/15/2019   TSH 4.650 (H) 05/24/2019    Therapeutic Level Labs: No results found for: LITHIUM No results found for: VALPROATE No components found for:  CBMZ  Current Medications: Current Outpatient Medications  Medication Sig Dispense Refill  . baclofen (LIORESAL) 20 MG tablet Take 1 tablet (20 mg total) by mouth 3 (three) times daily as needed for muscle spasms. 90 each 2  . benztropine (COGENTIN) 0.5 MG tablet Take 1 tablet (0.5 mg total) by mouth daily as needed for tremors. 30 tablet 1  . colchicine 0.6 MG tablet Take 0.6 mg by mouth 2 (two) times daily. Takes once daily    . Cranberry 500 MG TABS Take by mouth.     . DULoxetine (CYMBALTA) 30 MG capsule Take 1 capsule (30 mg total) by mouth daily. To be combined with 60 mg daily 90 capsule 0  . DULoxetine (CYMBALTA) 60 MG capsule Take 1 capsule (60 mg total) by mouth daily. To be combined with 30 mg daily 90 capsule 0  . glucose blood (PRECISION QID TEST) test strip Use once daily Use as instructed.    . hydrOXYzine (VISTARIL) 25 MG capsule Take 1 capsule (25 mg total) by mouth 2 (two) times daily as needed. For sever anxiety attacks 60 capsule 1  . Lactobacillus (PROBIOTIC ACIDOPHILUS PO) Take by mouth.    . lamoTRIgine (LAMICTAL) 25 MG tablet Take 1 tablet (25 mg total) by mouth daily. 90 tablet 0  . Lidocaine 0.5 % AERO Apply topically.    . liraglutide (VICTOZA) 18 MG/3ML  SOPN Inject into the skin.    [name] lisinopril (ZESTRIL) 20 MG tablet Take 1 tablet (20 mg total) by mouth daily. 90 tablet 3  . Magnesium Bisglycinate (MAG GLYCINATE) 100 MG TABS Take by mouth.    . magnesium oxide (MAG-OX) 400 MG tablet Take by mouth.    . Melatonin 5 MG CAPS Take 1-2 capsules (5-10 mg total) by mouth at bedtime. 100 capsule 0  . metFORMIN (GLUCOPHAGE) 1000 MG tablet Take 1 tablet by mouth 2 (two) times a day.    . pregabalin (LYRICA) 200 MG capsule Take 1 capsule (200 mg total) by mouth 3 (three) times daily. 90 capsule 2  . promethazine (PHENERGAN) 25 MG tablet Take 1 tablet by mouth as needed.     [name] QUEtiapine (SEROQUEL XR) 300 MG 24 hr tablet Take 1 tablet (300 mg total) by mouth at bedtime. 90 tablet 0  . Specialty Vitamins Products (MAGNESIUM, AMINO ACID CHELATE,) 133 MG tablet Take 1 tablet by mouth 2 (two) times daily.    . SUMAtriptan (IMITREX) 100 MG tablet TAKE 1 TABLET BY MOUTH ONCE AS NEEDED FOR MIGRAINE. MAY TAKE A SECOND DOSE AFTER 2 HOURS IF NEEDED    . tacrolimus (PROGRAF) 1 MG capsule Take 1 capsule by mouth once daily 30 capsule 0  . vitamin B-12 (CYANOCOBALAMIN) 1000 MCG tablet Take 1,000 mcg by mouth daily. Takes 5000 mcg    . XTAMPZA ER 9 MG C12A Take 1 capsule by mouth every 12 (twelve) hours.     No current facility-administered medications for this visit.     Musculoskeletal: Strength & Muscle Tone: UTA Gait & Station: normal Patient leans: N/A  Psychiatric Specialty Exam: Review of Systems  Musculoskeletal: Positive for myalgias.  Psychiatric/Behavioral: Negative for agitation, behavioral problems, confusion, decreased concentration, dysphoric mood, hallucinations, self-injury, sleep disturbance and suicidal ideas. The patient is not nervous/anxious and is not hyperactive.   All  other systems reviewed and are negative.   Last menstrual period 02/04/2018.There is no height or weight on file to calculate BMI.  General Appearance: Casual  Eye  Contact:  Fair  Speech:  Normal Rate  Volume:  Normal  Mood:  Euthymic  Affect:  Congruent  Thought Process:  Goal Directed and Descriptions of Associations: Intact  Orientation:  Full (Time, Place, and Person)  Thought Content: Logical   Suicidal Thoughts:  No  Homicidal Thoughts:  No  Memory:  Immediate;   Fair Recent;   Fair Remote;   Fair  Judgement:  Fair  Insight:  Fair  Psychomotor Activity:  Normal  Concentration:  Concentration: Fair and Attention Span: Fair  Recall:  [employer] of Knowledge: Fair  Language: Fair  Akathisia:  No  Handed:  Right  AIMS (if indicated): UTA  Assets:  Communication Skills Desire for Improvement Housing Social Support  ADL's:  Intact  Cognition: WNL  Sleep:  Fair   Screenings: PHQ2-9     Office Visit from 07/25/2020 in [facility] Office Visit from 06/05/2020 in [facility] Nutrition from 09/23/2019 in [facility] Office Visit from 09/16/2019 in [facility] Management from 08/10/2019 in [facility]  PHQ-2 Total Score 6 6 6 2 4   PHQ-9 Total Score 18 18 19 5 5        Assessment and Plan: Guila Owensby is a 44 year old Caucasian female on disability, has a history of bipolar disorder, PTSD, history of borderline personality disorder, chronic pain, fibromyalgia, trigeminal neuralgia, history of liver transplant, B-cell lymphoma, SLE, diabetes melitis, OSA, migraine headache was evaluated by telemedicine today.  Patient with psychosocial stressors of multiple health issues, current pandemic is currently stable on medications with regards to her mood.  Plan as noted below.  Plan Bipolar disorder-in remission Lamictal 25 mg p.o. daily Seroquel extended release 300 mg p.o. nightly Cymbalta 90 mg p.o. daily.   PTSD-stable Cymbalta 90 mg p.o. daily Hydroxyzine 25 mg p.o. 3 times daily as needed for anxiety attacks Melatonin 5  to 10 mg p.o. nightly Continue CBT with her therapist.   Neuroleptic induced Parkinson's disease-stable Cogentin 0.5 mg p.o. daily as needed  Follow-up in clinic in 6 weeks or sooner if needed.  I have spent atleast 20 minutes face to face by video with patient today. More than 50 % of the time was spent for preparing to see the patient ( e.g., review of test, records ), ordering medications and test ,psychoeducation and supportive psychotherapy and care coordination,as well as documenting clinical information in electronic health record. This note was generated in part or whole with voice recognition software. Voice recognition is usually quite accurate but there are transcription errors that can and very often do occur. I apologize for any typographical errors that were not detected and corrected.      [name], MD 08/11/2020, 7:52 AM

## 2020-08-11 DIAGNOSIS — F3181 Bipolar II disorder: Secondary | ICD-10-CM | POA: Diagnosis not present

## 2020-08-11 DIAGNOSIS — F4310 Post-traumatic stress disorder, unspecified: Secondary | ICD-10-CM | POA: Diagnosis not present

## 2020-08-14 ENCOUNTER — Other Ambulatory Visit: Payer: Self-pay | Admitting: Obstetrics and Gynecology

## 2020-08-14 ENCOUNTER — Other Ambulatory Visit: Payer: Self-pay

## 2020-08-14 NOTE — Patient Outreach (Signed)
Care Coordination - Case Manager  08/14/2020  Alison [name] [birth year] [id number]  Subjective:  Alison [name] is an 44 y.o. year old female who is a primary patient of Towanda Malkin, MD.  Alison [name] was given information about Medicaid Managed Care team care coordination services today. Meline [name] agreed to services and verbal consent obtained  Review of patient status, laboratory and other test data was performed as part of evaluation for provision of services.  SDOH: SDOH Screenings        Depression (PHQ2-9): Medium Risk  . PHQ-2 Score: 18   Objective:    Patient Care Plan: Chronic Pain (Adult)    Problem Identified: Chronic Pain Management-fibromyalgia     Long-Range Goal: Fibromyalgia pain managed-new pain management provider   Start Date: 08/14/2020  Expected End Date: 09/28/2020  This Visit's Progress: On track  Priority: High  Note:   Current Barriers:  . Care Coordination needs related to pain management provider.   Patient is experiencing fibromyalgia flair and is interested in help being connected to a pain management provider. . Unable to independently secure referral or appointment for pain management provider.  Nurse Case Manager Clinical Goal(s):  [name], patient will work with [facility] to address needs related to referral for pain management provider and associated care coordination needs.  Interventions:  . Inter-disciplinary care team collaboration (see longitudinal plan of care) . Evaluation of current treatment plan related to fibromyalgia  and patient's adherence to plan as established by provider. [name] with primary care provider regarding recommendations and referral to pain management provider. . Discussed plans with patient for ongoing care management follow up and provided patient with direct contact information for care management team . Anticipate pain education program, pain management support as  part of pain management referral.                 Patient Goals/Self-Care Activities Over the next 45 days, patient will:  -Attends all scheduled provider appointments Anticipate communication from PCP or pain management specialist office for new patient appointment.  develop a personal pain management plan with your pain management provider when appointment arranged.   Follow Up Plan: RN Care Manager will collaborate with PCP regarding pain management referral. The Managed Medicaid care management team will reach out to the patient again over the next 30 days.               Allergies  Allergen Reactions  . Penicillin G Itching, Rash, Anaphylaxis and Palpitations  . Carbamazepine Other (See Comments)    Medication interaction-prograf  . Hydrocodone-Acetaminophen Itching  . Naproxen Itching    Medications:    Medications Reviewed Today    Reviewed by [name], RN (Registered Nurse) on 08/14/20 at 1438  Med List Status: <None>  Medication Order Taking? Sig Documenting Provider Last Dose Status Informant  baclofen (LIORESAL) 20 MG tablet [id number] Yes Take 1 tablet (20 mg total) by mouth 3 (three) times daily as needed for muscle spasms. [name], FNP Taking Active   benztropine (COGENTIN) 0.5 MG tablet [id number] Yes Take 1 tablet (0.5 mg total) by mouth daily as needed for tremors. [name], MD Taking Active   colchicine 0.6 MG tablet [id number] Yes Take 0.6 mg by mouth 2 (two) times daily. Takes once daily [provider] Taking Active   Cranberry 500 MG TABS [id number] Yes Take by mouth.  [provider] Taking Active   DULoxetine (CYMBALTA) 30 MG  capsule [id number] Yes Take 1 capsule (30 mg total) by mouth daily. To be combined with 60 mg daily [name], [name], MD Taking Active   DULoxetine (CYMBALTA) 60 MG capsule [id number] Yes Take 1 capsule (60 mg total) by mouth daily. To be combined with 30 mg daily [name], [name], MD Taking Active     glucose blood (PRECISION QID TEST) test strip [id number] Yes Use once daily Use as instructed. [provider] Taking Active   hydrOXYzine (VISTARIL) 25 MG capsule [id number] Yes Take 1 capsule (25 mg total) by mouth 2 (two) times daily as needed. For sever anxiety attacks [name], MD Taking Active   Lactobacillus (PROBIOTIC ACIDOPHILUS PO) [id number] Yes Take by mouth. [provider] Taking Active   lamoTRIgine (LAMICTAL) 25 MG tablet [id number] Yes Take 1 tablet (25 mg total) by mouth daily. [name], MD Taking Active   Lidocaine 0.5 % AERO [id number] Yes Apply topically. [provider] Taking Active   liraglutide (VICTOZA) 18 MG/3ML SOPN [id number] Yes Inject into the skin. [provider] Taking Active   lisinopril (ZESTRIL) 20 MG tablet [id number] Yes Take 1 tablet (20 mg total) by mouth daily. Towanda Malkin, MD Taking Active   Magnesium Bisglycinate (MAG GLYCINATE) 100 MG TABS [id number] No Take by mouth.  Patient not taking: Reported on 08/14/2020   [provider] Not Taking Active   magnesium oxide (MAG-OX) 400 MG tablet [id number] Yes Take by mouth. [provider] Taking Active   Melatonin 5 MG CAPS [id number] Yes Take 1-2 capsules (5-10 mg total) by mouth at bedtime. [name], MD Taking Active   metFORMIN (GLUCOPHAGE) 1000 MG tablet [id number] Yes Take 1 tablet by mouth 2 (two) times a day. [provider] Taking Active   pregabalin (LYRICA) 200 MG capsule [id number]  Take 1 capsule (200 mg total) by mouth 3 (three) times daily. [name], MD  Expired 06/06/20 2359            Med Note ([name], [facility]   Fri Sep 10, 2019  9:09 AM) Dewaine Conger differently - 200 mg bid  promethazine (PHENERGAN) 25 MG tablet [id number]  Take 1 tablet by mouth as needed.  [provider]  Active   QUEtiapine (SEROQUEL XR) 300 MG 24 hr tablet [id number] Yes Take 1 tablet (300 mg total) by mouth at bedtime. [name], MD Taking Active   Specialty Vitamins Products (MAGNESIUM, AMINO ACID CHELATE,) 133 MG tablet [id number] Yes Take 1 tablet by mouth 2 (two) times daily. [provider] Taking Active   SUMAtriptan (IMITREX) 100 MG tablet [id number] Yes TAKE 1 TABLET BY MOUTH ONCE AS NEEDED FOR MIGRAINE. MAY TAKE A SECOND DOSE AFTER 2 HOURS IF NEEDED [provider] Taking Active   tacrolimus (PROGRAF) 1 MG capsule [id number] Yes Take 1 capsule by mouth once daily [name], FNP Taking Active   vitamin B-12 (CYANOCOBALAMIN) 1000 MCG tablet [id number] Yes Take 1,000 mcg by mouth daily. Takes 5000 mcg [provider] Taking Active   XTAMPZA ER 9 MG C12A [id number] Yes Take 1 capsule by mouth every 12 (twelve) hours. [provider] Taking Active           Assessment:   Goals Addressed            This Visit's Progress   . COMPLETED: "I need help finding a mental health counselor"       Current Barriers:  . History of bipolar disorder, PTSD, history of borderline personality disorder, chronic  pain from fibromyalgia and trigeminal neuralgia, history of liver transplant, B-cell lymphoma, SLE, diabetes melitis, hypertension, OSA, migraine headaches . Lacks knowledge of community resource: related to local mental health providers that accept her insurance . Suicidal Ideation/Homicidal Ideation: No  Clinical Social Work Goal(s):  [name] Over the next 90 days, patient will follow up with the [facility] for ongoing mental health treatment* as directed by SW  Interventions: . Patient interviewed and appropriate assessments performed . Confirmed that appointment with  the [facility] has been made for 08/20/19 . Confirmed patient's satisfaction with appointment made with the [facility] stating "I am glad that I have a safety net now" . Reinforced need and benefit to keep appointment with the [facility] to set up an intake appointment . Encouraged  continued to follow up with Psychiatrist ,Dr. Shea Evans for medication management . Confirmed that adoptive sister will be moving closer to her which will reduce feelings of isolation . Continued to reinforce the use of self care strategies when she can(swimming, show pony collection, sewing, journaling)  Patient Self Care Activities:  . Performs ADL's independently . Performs IADL's independently  Patient Coping Strengths:  . Supportive Relationships . Able to Communicate Effectively  Patient Self Care Deficits:  [name] Knowledge deficit regarding local mental health providers that accept her insurance  Please see past updates related to this goal by clicking on the "Past Updates" button in the selected goal      . Manage Pain          - develop a personal pain management plan with your pain management provider when appointment arranged.          Plan: Telephone follow up appointment over the next 14 days  Desirae Mancusi Ambulance person, [place] Management Coordinator - Managed Medicaid High Risk [phone]

## 2020-08-14 NOTE — Patient Instructions (Signed)
Visit Information  Ms. Lengel was given information about Medicaid Managed Care team care coordination services as a part of their Healthy [facility] Medicaid benefit. Olive Zmuda verbally consented to engagement with the [facility] Managed Care team.   For questions related to your Healthy [facility] health plan, please call: [phone] or visit the homepage here: [email or web address]  If you would like to schedule transportation through your Healthy [facility] plan, please call the following number at least 2 days in advance of your appointment: [phone]    Patient Care Plan: Chronic Pain (Adult)    Problem Identified: Chronic Pain Management-fibromyalgia     Long-Range Goal: Fibromyalgia pain managed-new pain management provider   Start Date: 08/14/2020  Expected End Date: 09/28/2020  This Visit's Progress: On track  Priority: High  Note:   Current Barriers:  . Care Coordination needs related to pain management provider.   Patient is experiencing fibromyalgia flair and is interested in help being connected to a pain management provider. . Unable to independently secure referral or appointment for pain management provider.  Nurse Case Manager Clinical Goal(s):  [name], patient will work with [facility] to address needs related to referral for pain management provider and associated care coordination needs.  Interventions:  . Inter-disciplinary care team collaboration (see longitudinal plan of care) . Evaluation of current treatment plan related to fibromyalgia  and patient's adherence to plan as established by provider. [name] with primary care provider regarding recommendations and referral to pain management provider. . Discussed plans with patient for ongoing care management follow up and provided patient with direct contact information for care management team . Anticipate pain education program, pain management  support as part of pain management referral.                 Patient Goals/Self-Care Activities Over the next 45 days, patient will:  -Attends all scheduled provider appointments Anticipate communication from PCP or pain management specialist office for new patient appointment.  develop a personal pain management plan with your pain management provider when appointment arranged.   Follow Up Plan: RN Care Manager will collaborate with PCP regarding pain management referral. The Managed Medicaid care management team will reach out to the patient again over the next 30 days.       The patient verbalized understanding of instructions provided today and declined a print copy of patient instruction materials.   RN Care Manager will perform telephone outreach over the next 14 days.  [name], [name] [place]  [facility] Curator - Managed Medicaid High Risk [phone]

## 2020-08-16 ENCOUNTER — Other Ambulatory Visit: Payer: Self-pay

## 2020-08-16 NOTE — Patient Instructions (Signed)
Visit Information  Alison [name] was given information about Medicaid Managed Care team care coordination services as a part of their Healthy [facility] Medicaid benefit. [name] verbally consented to engagement with the [facility] Managed Care team.   For questions related to your Healthy [facility] health plan, please call: [phone] or visit the homepage here: [email or web address]  If you would like to schedule transportation through your Healthy [facility] plan, please call the following number at least 2 days in advance of your appointment: [phone]    Social Worker will follow up with patient in 30 days.   Mickel Fuchs, [name], [place]  High Risk Managed Medicaid Team

## 2020-08-16 NOTE — Patient Outreach (Signed)
dfssssssssssssssssssssssssssssssssssssssssfCare Coordination  08/16/2020  Alison [name] [birth year] [id number]  Alison [name] is a 44 y.o. year old female who sees Towanda Malkin, MD for primary care. The  [facility] Managed Care team was consulted for assistance with a doctor that specializes in fibromyalgia and assistance locating a new eye doctor.. Alison [name] was given information about Care Management services, agreed to services, and verbal consent for services was obtained.  Interventions:  . Patient interviewed and appropriate assessments performed . Collaborated with clinical team regarding patient needs  . SDOH (Social Determinants of Health) assessments performed: Yes    . Provided patient with information about [place], [place], and Samantha Crimes [phone].   . Advised patient to go to the healthy Blue website, she will be able to search for a provider that accepts healthy blue.  Plan:  . Over the next 30 days, patient will work with [name] to address needs related to a doctor that specializes in fibromyalgia and assistance with locating a new eye doctor. . Social Worker will send a message to patient's PCP with recommendations for a provider that specializes in fibromyalgia. [name] will follow up with patient in 30 days.Mickel Fuchs, [name], [place] Managed Medicaid Team  [phone]

## 2020-08-23 DIAGNOSIS — F3189 Other bipolar disorder: Secondary | ICD-10-CM | POA: Diagnosis not present

## 2020-08-23 DIAGNOSIS — F320 Major depressive disorder, single episode, mild: Secondary | ICD-10-CM | POA: Diagnosis not present

## 2020-08-23 DIAGNOSIS — G894 Chronic pain syndrome: Secondary | ICD-10-CM | POA: Diagnosis not present

## 2020-08-23 DIAGNOSIS — G893 Neoplasm related pain (acute) (chronic): Secondary | ICD-10-CM | POA: Diagnosis not present

## 2020-08-23 DIAGNOSIS — I10 Essential (primary) hypertension: Secondary | ICD-10-CM | POA: Diagnosis not present

## 2020-08-23 DIAGNOSIS — M5414 Radiculopathy, thoracic region: Secondary | ICD-10-CM | POA: Diagnosis not present

## 2020-08-23 DIAGNOSIS — M5442 Lumbago with sciatica, left side: Secondary | ICD-10-CM | POA: Diagnosis not present

## 2020-08-28 ENCOUNTER — Other Ambulatory Visit: Payer: Self-pay

## 2020-08-28 NOTE — Patient Instructions (Signed)
Hi Ms. Kothari we missed you today- as a part of your Medicaid benefit, you are eligible for care management and care coordination services at no cost or copay. I was unable to reach you by phone today but would be happy to help you with your health related needs. Please feel free to call me at [phone]..   A member of the Managed Medicaid care management team will reach out to you again over the next 7 days.   [name], [name] [place]   Tri[facility] Curator - Managed Medicaid High Risk [phone].

## 2020-08-28 NOTE — Patient Outreach (Signed)
Care Coordination  08/28/2020  Bonnetta Allbee 21-Jan-1976 [id number]  An unsuccessful telephone outreach was attempted today. The patient was referred to the case management team for assistance with care management and care coordination.   Follow Up Plan: The Managed Medicaid care management team will reach out to the patient again over the next 7 days.   [name], [name] [place]  Triad Curator - Managed Medicaid High Risk [phone].

## 2020-08-29 ENCOUNTER — Encounter: Payer: Self-pay | Admitting: Emergency Medicine

## 2020-08-29 ENCOUNTER — Ambulatory Visit: Payer: Self-pay

## 2020-08-29 ENCOUNTER — Ambulatory Visit (INDEPENDENT_AMBULATORY_CARE_PROVIDER_SITE_OTHER): Payer: Medicaid Other

## 2020-08-29 ENCOUNTER — Encounter: Payer: Self-pay | Admitting: Internal Medicine

## 2020-08-29 ENCOUNTER — Ambulatory Visit
Admission: EM | Admit: 2020-08-29 | Discharge: 2020-08-29 | Disposition: A | Discharge status: Home / Self Care | Payer: Medicaid Other | Attending: Physician Assistant | Admitting: Physician Assistant

## 2020-08-29 ENCOUNTER — Other Ambulatory Visit: Payer: Self-pay

## 2020-08-29 DIAGNOSIS — W19XXXA Unspecified fall, initial encounter: Secondary | ICD-10-CM | POA: Diagnosis not present

## 2020-08-29 DIAGNOSIS — S9032XA Contusion of left foot, initial encounter: Secondary | ICD-10-CM

## 2020-08-29 DIAGNOSIS — M79672 Pain in left foot: Secondary | ICD-10-CM

## 2020-08-29 DIAGNOSIS — M7732 Calcaneal spur, left foot: Secondary | ICD-10-CM | POA: Diagnosis not present

## 2020-08-29 DIAGNOSIS — S9002XA Contusion of left ankle, initial encounter: Secondary | ICD-10-CM | POA: Diagnosis not present

## 2020-08-29 NOTE — ED Provider Notes (Signed)
[facility]    CSN: [id number] Arrival date & time: 08/29/20  1505      History   Chief Complaint Chief Complaint  Patient presents with  . Toe Pain    HPI Alison [name] is a 44 y.o. female presenting for left foot pain since yesterday.  Patient states that Alison [name] was having tremors due to medication side effect and fell and hurt her toes.  Alison [name] says that her pinky toe is very swollen and bruised.  Patient says Alison [name] took her benztropine and that relieved her tremors.  Alison [name] denies any head injury or loss of consciousness.  Alison [name] believes her pinky toe is broken.  Patient says that Alison [name] has a lot of pain with weightbearing.  Alison [name] does have history of peripheral neuropathy but denies any increased numbness or tingling.  Patient's past medical history significant for bipolar disorder, CKD stage III, previous history of lymphoma, diabetes, trigeminal neuralgia, hypertension, lupus, status post liver transplant, obstructive sleep apnea, and chronic pain. Patient denies any other complaints or concerns today.  HPI  Past Medical History:  Diagnosis Date  . Abnormal uterine bleeding   . Allergy   . Anxiety   . Arthritis   . Bipolar disorder (manic depression) ([place])   . Chronic kidney failure   . Chronic renal disease, stage III ([place])   . Diabetes mellitus without complication ([place])   . 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  . FH: trigeminal neuralgia   . GERD (gastroesophageal reflux disease)   . Heart murmur   . Hypertension   . Kidney mass   . Lupus ([place])   . Lupus ([place])   . Major depressive disorder   . 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 Migraine   . Morbid obesity ([place])   . Neuromuscular disorder (HCC)    neuropathy  . Neuropathy   . Personality disorder ([place])   . Post herpetic neuralgia   . PTSD (post-traumatic stress disorder)   . Renal disorder   . S/P liver transplant  [facility])     Patient Active Problem List   Diagnosis Date Noted  . Suprapubic pain 07/25/2020  . Bipolar 1 disorder, depressed ([place]) 07/10/2020  . 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 Edema of lower extremity 09/16/2019  . Carpal tunnel syndrome, left 07/26/2019  . Cubital tunnel syndrome on left 07/26/2019  . Bipolar I disorder, most recent episode depressed ([place]) 07/12/2019  . PTSD (post-traumatic stress disorder) 07/12/2019  . Umbilical hernia without obstruction and without gangrene 06/17/2019  . Encounter for surveillance of abnormal nevi 06/17/2019  . Pineal gland cyst 06/17/2019  . Chronic hip pain, left 06/02/2019  . Lumbar spondylosis 06/02/2019  . Mouth dryness 06/02/2019  . Obesity (BMI 35.0-39.9 without comorbidity) 06/02/2019  . Goals of care, counseling/discussion 05/18/2019  . 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 Occipital neuralgia 05/13/2019  . Plantar fasciitis of left foot 04/27/2019  . Fibromyalgia 03/23/2019  . Systemic lupus erythematosus (SLE) in adult ([place]) 03/23/2019  . Essential hypertension 03/23/2019  . Hepatitis 03/23/2019  . Mass of left kidney 03/23/2019  . Diabetes mellitus ([place]) 03/23/2019  . Nausea without vomiting 03/23/2019  . Neuropathy 03/23/2019  . Cancer ([place]) 03/23/2019  . Hyperlipidemia 03/23/2019  . Osteoarthritis 03/23/2019  . Trigeminal neuralgia 03/23/2019  . Chronic renal disease,  stage III ([place]) 03/23/2019  . Nephrolithiasis 03/23/2019  . Migraine 03/23/2019  . OSA on CPAP 03/23/2019  . Fibrocystic breast 03/23/2019  . Heart murmur 03/23/2019  . Bipolar disorder, in partial remission, most recent episode depressed ([place]) 07/02/2017  . Abnormal uterine bleeding 03/18/2017  . Major depressive disorder, recurrent ([place]) 03/18/2017  . Morbid obesity ([place]) 03/18/2017  . Secondary hyperparathyroidism  ([place]) 02/07/2017  . Neuropathic pain 11/19/2016  . Post herpetic neuralgia 11/19/2016  . 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[place]) 01/13/2015  . Lumbar disc disease with radiculopathy 07/26/2013  . S/P liver transplant ([place]) 07/26/2013  . Type 2 diabetes mellitus, uncontrolled ([place]) 07/26/2013  . Facial nerve disorder 06/29/2012  . Low back pain 12/26/2010    Past Surgical History:  Procedure Laterality Date  . BONE MARROW BIOPSY  01/13/2015  . BREAST BIOPSY  12/2014  . BREAST BIOPSY  2011  . BREAST SURGERY    . CHOLECYSTECTOMY    . HERNIA REPAIR    . infusaport    . LIVER TRANSPLANT  12/17/1991  . LUMBAR PUNCTURE    . PORT A CATH INJECTION ([place] HX)    . tumor removal  2015    OB History    Gravida  0   Para  0   Term  0   Preterm  0   AB  0   Living  0     SAB  0   TAB  0   Ectopic  0   Multiple  0   Live Births  0            Home Medications    Prior to Admission medications   Medication Sig Start Date End Date Taking? Authorizing Provider  baclofen (LIORESAL) 20 MG tablet Take 1 tablet (20 mg total) by mouth 3 (three) times daily as needed for muscle spasms. 09/16/19  Yes [name], FNP  benztropine (COGENTIN) 0.5 MG tablet Take 1 tablet (0.5 mg total) by mouth daily as needed for tremors. 07/10/20  Yes [name], MD  colchicine 0.6 MG tablet Take 0.6 mg by mouth 2 (two) times daily. Takes once daily 04/11/20  Yes [provider]  DULoxetine (CYMBALTA) 30 MG capsule Take 1 capsule (30 mg total) by mouth daily. To be combined with 60 mg daily 08/10/20  Yes [name], [name], MD  DULoxetine (CYMBALTA) 60 MG capsule Take 1 capsule (60 mg total) by mouth daily. To be combined with 30 mg daily 08/10/20  Yes [name], [name], MD  glucose blood (PRECISION QID TEST) test strip Use once daily Use as instructed. 08/06/19  Yes [provider]  hydrOXYzine (VISTARIL) 25 MG capsule Take 1 capsule (25 mg total) by mouth 2 (two) times  daily as needed. For sever anxiety attacks 03/24/20  Yes [name], [name], MD  Lactobacillus (PROBIOTIC ACIDOPHILUS PO) Take by mouth.   Yes [provider]  lamoTRIgine (LAMICTAL) 25 MG tablet Take 1 tablet (25 mg total) by mouth daily. 08/10/20  Yes [name], MD  Lidocaine 0.5 % AERO Apply topically.   Yes [provider]  liraglutide (VICTOZA) 18 MG/3ML SOPN Inject into the skin. 01/03/20  Yes [provider]  lisinopril (ZESTRIL) 20 MG tablet Take 1 tablet (20 mg total) by mouth daily. 06/07/20  Yes Lebron Conners D, MD  Magnesium Bisglycinate (MAG GLYCINATE) 100 MG TABS Take by mouth.    Yes [provider]  magnesium oxide (MAG-OX) 400 MG tablet Take by mouth.  Yes [provider]  Melatonin 5 MG CAPS Take 1-2 capsules (5-10 mg total) by mouth at bedtime. 09/10/19  Yes [name], MD  metFORMIN (GLUCOPHAGE) 1000 MG tablet Take 1 tablet by mouth 2 (two) times a day.   Yes [provider]  promethazine (PHENERGAN) 25 MG tablet Take 1 tablet by mouth as needed.    Yes [provider]  QUEtiapine (SEROQUEL XR) 300 MG 24 hr tablet Take 1 tablet (300 mg total) by mouth at bedtime. 07/10/20  Yes [name], MD  Specialty Vitamins Products (MAGNESIUM, AMINO ACID CHELATE,) 133 MG tablet Take 1 tablet by mouth 2 (two) times daily.   Yes [provider]  SUMAtriptan (IMITREX) 100 MG tablet TAKE 1 TABLET BY MOUTH ONCE AS NEEDED FOR MIGRAINE. MAY TAKE A SECOND DOSE AFTER 2 HOURS IF NEEDED 05/19/19  Yes [provider]  tacrolimus (PROGRAF) 1 MG capsule Take 1 capsule by mouth once daily 07/08/19  Yes [name], FNP  vitamin B-12 (CYANOCOBALAMIN) 1000 MCG tablet Take 1,000 mcg by mouth daily. Takes 5000 mcg   Yes [provider]  XTAMPZA ER 9 MG C12A Take 1 capsule by mouth every 12 (twelve) hours. 04/28/20  Yes [provider]  Cranberry 500 MG TABS Take by mouth.     [provider]  pregabalin (LYRICA) 200 MG capsule Take 1 capsule (200 mg total) by mouth 3 (three) times daily. 07/05/19 06/06/20  [name], MD    Family History Family History  Problem Relation Age of Onset  . Heart disease Mother   . Hypertension Mother   . Cancer - Other Mother   . Bipolar disorder Mother   . Heart disease Father   . Hypertension Father   . Diabetes Father   . Parkinson's disease Maternal Grandmother   . Cancer Maternal Aunt   . Cancer Maternal Uncle   . Cancer Maternal Grandfather   . Lupus Paternal Grandmother   . Hypertension Brother     Social History Social History   Tobacco Use  . Smoking status: Never Smoker  . Smokeless tobacco: Never Used  Vaping Use  . Vaping Use: Never used  Substance Use Topics  . Alcohol use: Not Currently  . Drug use: Not Currently    Types: Marijuana    Comment: pain managment last used in early april     Allergies   Penicillin g, Carbamazepine, Hydrocodone-acetaminophen, and Naproxen   Review of Systems Review of Systems  Respiratory: Negative for shortness of breath.   Cardiovascular: Negative for chest pain and palpitations.  Musculoskeletal: Positive for arthralgias, gait problem and joint swelling.  Skin: Negative for color change, rash and wound.  Neurological: Positive for tremors (chronic). Negative for dizziness, syncope, weakness, numbness and headaches.     Physical Exam Triage Vital Signs ED Triage Vitals  Enc Vitals Group     BP 08/29/20 1537 114/86     Pulse Rate 08/29/20 1537 79     Resp 08/29/20 1537 18     Temp 08/29/20 1537 98.6 F (37 C)     Temp Source 08/29/20 1537 Oral     SpO2 08/29/20 1537 99 %     Weight 08/29/20 1534 269 lb (122 kg)     Height 08/29/20 1534 _0  (1.702 m)     Head Circumference --      Peak Flow --      Pain Score 08/29/20 1533 6     Pain Loc --  Pain Edu? --      Excl. in [place]? --    No data found.  Updated Vital Signs BP 114/86 (BP Location: Right Arm)    Pulse 79   Temp 98.6 F (37 C) (Oral)   Resp 18   Ht _0  (1.702 m)   Wt 269 lb (122 kg)   LMP 02/04/2018 (Approximate) Comment: Per patient, over a year ago  SpO2 99%   BMI 42.13 kg/m      Physical Exam Vitals and nursing note reviewed.  Constitutional:      General: Alison [name] is not in acute distress.    Appearance: Normal appearance. Alison [name] is not ill-appearing or toxic-appearing.  HENT:     Head: Normocephalic and atraumatic.  Eyes:     General: No scleral icterus.       Right eye: No discharge.        Left eye: No discharge.     Conjunctiva/sclera: Conjunctivae normal.  Cardiovascular:     Rate and Rhythm: Normal rate and regular rhythm.     Pulses: Normal pulses.     Heart sounds: Normal heart sounds.  Pulmonary:     Effort: Pulmonary effort is normal. No respiratory distress.     Breath sounds: Normal breath sounds.  Musculoskeletal:     Cervical back: Neck supple.     Left foot: Normal range of motion. Swelling (moderate swelling 3rd, 4th, 5th toes. eccymosis 5th digit) and tenderness (diffuse TTP of 3-5 toes) present.  Skin:    General: Skin is dry.  Neurological:     General: No focal deficit present.     Mental Status: Alison [name] is alert. Mental status is at baseline.     Motor: No weakness.     Gait: Gait abnormal.  Psychiatric:        Mood and Affect: Mood normal.        Behavior: Behavior normal.        Thought Content: Thought content normal.      UC Treatments / Results  Labs (all labs ordered are listed, but only abnormal results are displayed) Labs Reviewed - No data to display  EKG   Radiology DG Foot Complete Left  Result Date: 08/29/2020 CLINICAL DATA:  Fall, foot pain, 5th toe bruising EXAM: LEFT FOOT - COMPLETE 3+ VIEW COMPARISON:  None. FINDINGS: Plantar calcaneal spur. Mild degenerative changes at the 1st MTP joint. No acute bony abnormality. Specifically, no fracture, subluxation, or dislocation. IMPRESSION: No acute bony abnormality.  Electronically Signed   By: Rolm Baptise M.D.   On: 08/29/2020 16:26    Procedures Procedures (including critical care time)  Medications Ordered in UC Medications - No data to display  Initial Impression / Assessment and Plan / UC Course  I have reviewed the triage vital signs and the nursing notes.  Pertinent labs & imaging results that were available during my care of the patient were reviewed by me and considered in my medical decision making (see chart for details).   Imaging negative for fractures.  Discussed results with patient.  Advised RICE and taking at home medications for pain relief.  Patient has benztropine to take for her tremors that are caused the side effect of multiple medications.  Alison [name] has been having tremors for a while.  Alison [name] says that her prescriber is aware and monitoring.  Advised to follow-up with PCP or prescriber as needed regarding this.  Final Clinical Impressions(s) / UC Diagnoses   Final diagnoses:  Contusion of  left foot, initial encounter  Left foot pain     Discharge Instructions     No part of the foot or toes are broken.  Ice and elevate the area.  You can take anti-inflammatory medication, Tylenol, at home pain medication if needed.  Should get better over the next 2 days.  Follow-up with [place] as needed.    ED Prescriptions    None     PDMP not reviewed this encounter.   Danton Clap, PA-C 08/29/20 1657

## 2020-08-29 NOTE — Discharge Instructions (Signed)
No part of the foot or toes are broken.  Ice and elevate the area.  You can take anti-inflammatory medication, Tylenol, at home pain medication if needed.  Should get better over the next 2 days.  Follow-up with [place] as needed.

## 2020-08-29 NOTE — ED Triage Notes (Signed)
Patient states she fell yesterday and states all of her toes on her left foot went backwards. She is only c/o toe pain.

## 2020-08-30 NOTE — Telephone Encounter (Signed)
Spoke with patient on today and she informed me that she went to a walk in clinic on yesterday, 08/29/20.  Patient stated they did an x-ray and informed her that nothing was broken but was just bruised.  She is just going to rest and keep her foot elevated and iced.

## 2020-09-06 ENCOUNTER — Other Ambulatory Visit: Payer: Self-pay

## 2020-09-06 ENCOUNTER — Other Ambulatory Visit: Payer: Self-pay | Admitting: Obstetrics and Gynecology

## 2020-09-06 NOTE — Patient Instructions (Signed)
Hi Alison [name], thank you for speaking with me today.  Alison [name] was given information about Medicaid Managed Care team care coordination services as a part of their Healthy [facility] Medicaid benefit. Alison [name] verbally consented to engagement with the [facility] Managed Care team.   For questions related to your Healthy [facility] health plan, please call: [phone] or visit the homepage here: [email or web address]  If you would like to schedule transportation through your Healthy [facility] plan, please call the following number at least 2 days in advance of your appointment: [phone]  Goals Addressed            This Visit's Progress   . Manage Pain         Current Barriers:   Care Coordination needs related to pain management provider.   Patient is experiencing fibromyalgia flair and is interested in help being connected to a pain management provider.  Unable to independently secure referral or appointment for pain management provider.   Nurse Case Manager Clinical Goal(s):   Over the next 45 days, patient will work with [facility] to address needs related to referral for pain management provider and associated care coordination needs.   Interventions:   Inter-disciplinary care team collaboration (see longitudinal plan of care)  Evaluation of current treatment plan related to fibromyalgia  and patient's adherence to plan as established by provider.  Collaborated with primary care provider regarding recommendations and referral to pain management provider.  Discussed plans with patient for ongoing care management follow up and provided patient with direct contact information for care management team  Anticipate pain education program, pain management support as part of pain management referral.                  Patient Goals/Self-Care Activities Over the next 45 days, patient will:  -Attends all scheduled provider  appointments Anticipate communication from PCP or pain management specialist office for new patient appointment.  develop a personal pain management plan with your pain management provider when appointment arranged.    Follow Up Plan: [name] Care Manager will collaborate with PCP regarding pain management referral. The Managed Medicaid care management team will reach out to the patient again over the next 30 days. Update 09/06/20-patient states she has not received any communication regarding a pain management provider-RNCM will follow up.  Patient states she is currently experiencing a gout flare up as well but is improving.  Patient recently fell and hurt her toes, but states pain is improved and she is walking without difficulty.          Patient Care Plan: Wellness (Adult)    Problem Identified: Medication Adherence (Wellness)     Goal: Medication Adherence Maintained   Start Date: 09/06/2020  Expected End Date: 12/05/2020  This Visit's Progress: Not on track  Priority: High  Note:   Current Barriers:  . Patient states she needs to have prescriptions refilled and feels she has some side effects from her medications.  Nurse Case Manager Clinical Goal(s):  [name] Over the next 30 days, patient will work with CM team pharmacist to review current medications.  Interventions:  . Inter-disciplinary care team collaboration (see longitudinal plan of care) . Evaluation of current treatment plan and patient's adherence to plan as established by provider. . Advised patient to contact her PCP for any medication needs. . Reviewed medications with patient. [name] with pharmacy regarding medications.  . Discussed plans with patient for ongoing care management follow up and provided patient with  direct contact information for care management team . Pharmacy referral for medication review  Patient Goals/Self-Care Activities Over the next 30 days, patient will:  -Patient will take medications as  prescribed. RNCM will follow up with patient within 30 days and make referral to pharmacy. Calls pharmacy for medication refills Calls provider office for new concerns or questions  Follow Up Plan: The Managed Medicaid care management team will reach out to the patient again over the next 30 days.  The patient has been provided with contact information for the Managed Medicaid care management team and has been advised to call with any health related questions or concerns.         Patient verbalizes understanding of instructions provided today.   The Managed Medicaid care management team will reach out to the patient again over the next 30 days.  The patient has been provided with contact information for the Managed Medicaid care management team and has been advised to call with any health related questions or concerns.   Alison [name], [name] [place]  Triad Curator - Managed Medicaid High Risk [phone].

## 2020-09-06 NOTE — Patient Outreach (Addendum)
Care Coordination - Case Manager  09/06/2020  Alison [name] 1976-01-27 [id number]  Subjective:  Alison [name] is an 44 y.o. year old female who is a primary patient of Towanda Malkin, MD.  Alison [name] was given information about Medicaid Managed Care team care coordination services today. Alison [name] agreed to services and verbal consent obtained  Review of patient status, laboratory and other test data was performed as part of evaluation for provision of services.  SDOH: SDOH Screenings   Alcohol Screen: Low Risk   . Last Alcohol Screening Score (AUDIT): 0  Depression (PHQ2-9): Medium Risk  . PHQ-2 Score: 18  Financial Resource Strain:   . Difficulty of Paying Living Expenses: Not on file  Food Insecurity:   . Worried About Charity fundraiser in the Last Year: Not on file  . Ran Out of Food in the Last Year: Not on file  Housing:   . Last Housing Risk Score: Not on file  Physical Activity:   . Days of Exercise per Week: Not on file  . Minutes of Exercise per Session: Not on file  Social Connections:   . Frequency of Communication with Friends and Family: Not on file  . Frequency of Social Gatherings with Friends and Family: Not on file  . Attends Religious Services: Not on file  . Active Member of Clubs or Organizations: Not on file  . Attends Archivist Meetings: Not on file  . Marital Status: Not on file  Stress:   . Feeling of Stress : Not on file  Tobacco Use: Low Risk   . Smoking Tobacco Use: Never Smoker  . Smokeless Tobacco Use: Never Used  Transportation Needs:   . Film/video editor (Medical): Not on file  . Lack of Transportation (Non-Medical): Not on file     Objective:    Allergies  Allergen Reactions  . Penicillin G Itching, Rash, Anaphylaxis and Palpitations  . Carbamazepine Other (See Comments)    Medication interaction-prograf  . Hydrocodone-Acetaminophen Itching  . Naproxen Itching    Medications:     Medications Reviewed Today    Reviewed by [name], RN (Registered Nurse) on 09/06/20 at 1451  Med List Status: <None>  Medication Order Taking? Sig Documenting Provider Last Dose Status Informant  baclofen (LIORESAL) 20 MG tablet [id number] Yes Take 1 tablet (20 mg total) by mouth 3 (three) times daily as needed for muscle spasms. [name], FNP Taking Active   benztropine (COGENTIN) 0.5 MG tablet [id number] Yes Take 1 tablet (0.5 mg total) by mouth daily as needed for tremors. [name], MD Taking Active   colchicine 0.6 MG tablet [id number] Yes Take 0.6 mg by mouth 2 (two) times daily. Takes once daily [provider] Taking Active   Cranberry 500 MG TABS [id number] No Take by mouth.   Patient not taking: Reported on 09/06/2020   [provider] Not Taking Active   DULoxetine (CYMBALTA) 30 MG capsule [id number] Yes Take 1 capsule (30 mg total) by mouth daily. To be combined with 60 mg daily [name], [name], MD Taking Active   DULoxetine (CYMBALTA) 60 MG capsule [id number] Yes Take 1 capsule (60 mg total) by mouth daily. To be combined with 30 mg daily [name], [name], MD Taking Active   glucose blood (PRECISION QID TEST) test strip [id number] Yes Use once daily Use as instructed. [provider] Taking Active   hydrOXYzine (VISTARIL) 25 MG capsule [id number] Yes Take 1 capsule (25 mg total) by  mouth 2 (two) times daily as needed. For sever anxiety attacks [name], MD Taking Active   Lactobacillus (PROBIOTIC ACIDOPHILUS PO) [id number] Yes Take by mouth. [provider] Taking Active   lamoTRIgine (LAMICTAL) 25 MG tablet [id number] Yes Take 1 tablet (25 mg total) by mouth daily. [name], MD Taking Active   Lidocaine 0.5 % AERO [id number] Yes Apply topically. [provider] Taking Active   liraglutide (VICTOZA) 18 MG/3ML SOPN [id number] Yes Inject into the skin. [provider] Taking Active   lisinopril (ZESTRIL) 20 MG  tablet [id number] Yes Take 1 tablet (20 mg total) by mouth daily. Towanda Malkin, MD Taking Active   Magnesium Bisglycinate (MAG GLYCINATE) 100 MG TABS [id number] Yes Take by mouth.  [provider] Taking Active   magnesium oxide (MAG-OX) 400 MG tablet [id number] Yes Take by mouth. [provider] Taking Active   Melatonin 5 MG CAPS [id number] Yes Take 1-2 capsules (5-10 mg total) by mouth at bedtime. [name], MD Taking Active   metFORMIN (GLUCOPHAGE) 1000 MG tablet [id number] Yes Take 1 tablet by mouth 2 (two) times a day. [provider] Taking Active   pregabalin (LYRICA) 200 MG capsule [id number]  Take 1 capsule (200 mg total) by mouth 3 (three) times daily. [name], MD  Expired 06/06/20 2359            Med Note (Evalyne Cortopassi, [name]   Wed Sep 06, 2020  2:51 PM) Taking  promethazine (PHENERGAN) 25 MG tablet [id number] Yes Take 1 tablet by mouth as needed.  [provider] Taking Active   QUEtiapine (SEROQUEL XR) 300 MG 24 hr tablet [id number] Yes Take 1 tablet (300 mg total) by mouth at bedtime. [name], MD Taking Active   Specialty Vitamins Products (MAGNESIUM, AMINO ACID CHELATE,) 133 MG tablet [id number] Yes Take 1 tablet by mouth 2 (two) times daily. [provider] Taking Active   SUMAtriptan (IMITREX) 100 MG tablet [id number] Yes TAKE 1 TABLET BY MOUTH ONCE AS NEEDED FOR MIGRAINE. MAY TAKE A SECOND DOSE AFTER 2 HOURS IF NEEDED [provider] Taking Active   tacrolimus (PROGRAF) 1 MG capsule [id number] Yes Take 1 capsule by mouth once daily [name], FNP Taking Active   vitamin B-12 (CYANOCOBALAMIN) 1000 MCG tablet [id number] Yes Take 1,000 mcg by mouth daily. Takes 5000 mcg [provider] Taking Active   XTAMPZA ER 13.5 MG C12A [id number] Yes Take 1 capsule by mouth every 12 (twelve) hours.  [provider] Taking Active           Assessment:   Goals Addressed            This Visit's  Progress   . Manage Pain         Current Barriers:   Care Coordination needs related to pain management provider.   Patient is experiencing fibromyalgia flair and is interested in help being connected to a pain management provider.  Unable to independently secure referral or appointment for pain management provider.   Nurse Case Manager Clinical Goal(s):   Over the next 45 days, patient will work with [facility] to address needs related to referral for pain management provider and associated care coordination needs.   Interventions:   Inter-disciplinary care team collaboration (see longitudinal plan of care)  Evaluation of current treatment plan related to fibromyalgia  and patient's adherence to plan as established by provider.  Collaborated with primary care provider regarding recommendations and referral to pain management provider.  Discussed plans with patient for ongoing care management follow up and provided patient with direct contact information for care management team  Anticipate pain education program, pain management support as part of pain management referral.                  Patient Goals/Self-Care Activities Over the next 45 days, patient will:  -Attends all scheduled provider appointments Anticipate communication from PCP or pain management specialist office for new patient appointment.  develop a personal pain management plan with your pain management provider when appointment arranged.    Follow Up Plan: RN Care Manager will collaborate with PCP regarding pain management referral. The Managed Medicaid care management team will reach out to the patient again over the next 30 days. Update 09/06/20-patient states she has not received any communication regarding a pain management provider-RNCM will follow up.  Patient states she is currently experiencing a gout flare up as well but is improving.  Patient recently fell and hurt her toes, but states pain is improved and she is  walking without difficulty. She sates she has a bladder infection which is improving, but will notify her PCP.       Patient Care Plan: Chronic Pain (Adult)    Problem Identified: Chronic Pain Management-fibromyalgia     Long-Range Goal: Fibromyalgia pain managed-new pain management provider   Start Date: 08/14/2020  Expected End Date: 09/28/2020  This Visit's Progress: On track  Priority: High  Note:   Current Barriers:  . Care Coordination needs related to pain management provider.   Patient is experiencing fibromyalgia flair and is interested in help being connected to a pain management provider. . Unable to independently secure referral or appointment for pain management provider.  Nurse Case Manager Clinical Goal(s):  [name], patient will work with [facility] to address needs related to referral for pain management provider and associated care coordination needs.  Interventions:  . Inter-disciplinary care team collaboration (see longitudinal plan of care) . Evaluation of current treatment plan related to fibromyalgia  and patient's adherence to plan as established by provider. [name] with primary care provider regarding recommendations and referral to pain management provider. . Discussed plans with patient for ongoing care management follow up and provided patient with direct contact information for care management team . Anticipate pain education program, pain management support as part of pain management referral.                 Patient Goals/Self-Care Activities Over the next 45 days, patient will:  -Attends all scheduled provider appointments Anticipate communication from PCP or pain management specialist office for new patient appointment.  develop a personal pain management plan with your pain management provider when appointment arranged.   Follow Up Plan: RN Care Manager will collaborate with PCP regarding pain management referral. The Managed  Medicaid care management team will reach out to the patient again over the next 30 days.      Patient Care Plan: Wellness (Adult)    Problem Identified: Medication Adherence (Wellness)     Goal: Medication Adherence Maintained   Start Date: 09/06/2020  Expected End Date: 12/05/2020  This Visit's Progress: Not on track  Priority: High  Note:   Current Barriers:  . Patient states she needs to have prescriptions refilled and feels she has some side effects from her medications.  Nurse Case Manager Clinical Goal(s):  [name] Over the next 30 days, patient will work with CM team pharmacist to  review current medications.  Interventions:  . Inter-disciplinary care team collaboration (see longitudinal plan of care) . Evaluation of current treatment plan and patient's adherence to plan as established by provider. . Advised patient to contact her PCP for any medication needs. . Reviewed medications with patient. [name] with pharmacy regarding medications.  . Discussed plans with patient for ongoing care management follow up and provided patient with direct contact information for care management team . Pharmacy referral for medication review  Patient Goals/Self-Care Activities Over the next 30 days, patient will:  -Patient will take medications as prescribed. RNCM will follow up with patient within 30 days and make referral to pharmacy. Calls pharmacy for medication refills Calls provider office for new concerns or questions  Follow Up Plan: The Managed Medicaid care management team will reach out to the patient again over the next 30 days.  The patient has been provided with contact information for the Managed Medicaid care management team and has been advised to call with any health related questions or concerns.         Plan: As stated above-RNCM will follow up with patient within 30 days.

## 2020-09-08 DIAGNOSIS — F4310 Post-traumatic stress disorder, unspecified: Secondary | ICD-10-CM | POA: Diagnosis not present

## 2020-09-08 DIAGNOSIS — F3181 Bipolar II disorder: Secondary | ICD-10-CM | POA: Diagnosis not present

## 2020-09-11 ENCOUNTER — Other Ambulatory Visit: Payer: Self-pay

## 2020-09-11 NOTE — Patient Instructions (Addendum)
Visit Information  Alison [name] was given information about Medicaid Managed Care team care coordination services as a part of their Healthy [facility] Medicaid benefit. Alison [name] verbally consented to engagement with the [facility] Managed Care team.   For questions related to your Healthy [facility] health plan, please call: [phone] or visit the homepage here: [email or web address]  If you would like to schedule transportation through your Healthy [facility] plan, please call the following number at least 2 days in advance of your appointment: [phone]  Goals Addressed            This Visit's Progress   . Medication Management       Medicaid Managed Care CARE PLAN ENTRY (see longitudinal plan of care for additional care plan information)  Current Barriers:  . Social, financial, community barriers: Patient just moved. Income and patient paying out of pocket for medications . Patient with complex health conditions multiple comorbidities including DM, Hx Liver Transplant, Chronic Pain . Self-manages medications. Has a pill box but does not use a pill box or other adherence strategies . [name], [name]     Pharmacist Clinical Goal(s):  [name] Over the next 90 days, patient will work with PharmD and provider towards optimized medication management  Interventions: . Comprehensive medication review performed; medication list updated in electronic medical record . Inter-disciplinary care team collaboration (see longitudinal plan of care)   Tremors Benztropine 0.9m QD PRN Plan: Patient states she rarely has tremors   Bi[facility]olar/PTSD Dr.[name] from psychiatry [facility] 07/25/2020 06/05/2020 09/23/2019 09/16/2019 08/10/2019  Decreased Interest 3 3 3 1 1   Down, Depressed, Hopeless 3 3 3 1 3   PHQ - 2  Score 6 6 6 2 4   Altered sleeping 3 3 3  0 0  Tired, decreased energy 2 3 3 1  0  Change in appetite 2 2 3 1 1   Feeling bad or failure about yourself  2 1 0 0 0  Trouble concentrating 3 3 2 1  0  Moving slowly or fidgety/restless 0 0 2 0 0  Suicidal thoughts 0 0 0 0 -  PHQ-9 Score 18 18 19 5 5   Difficult doing work/chores Very difficult Very difficult - Not difficult at all -  Some recent data might be hidden    PHQ last results Duloxetine 932mQD (6044mS and 59m43m) Hydroxyzine 25mg80m Severe Anxiety Lamotrigine 25mg 38muetiapine 300mg H95mied/Failed: Alprazolam Plan: Defer to specialist  Liver Transplant 16 year27old Tacrolimus 1mg QD 44mn: At goal, maintain therapy   DM Lab Results  Component Value Date   HGBA1C 8.9 (H) 06/05/2020    Lab Results  Component Value Date   LABMICR See below: 04/20/2020   LABMICR See below: 10/05/2019   MICROALBUR 18.7 06/05/2020   Endocrine manages Testing: 1/week (hates seeing bad numbers) Urine Albumin Metformin 1000mg BID59maglutide QD (forgets to take 3-4x/week) Plan: Will keep Liraglutide outside of fridge in pill bag (Good for 30 days, informed patient of this), this should improve compliance. F/U March, after A1C in Feb.  HTN BP Readings from Last 3 Encounters:  08/29/20 114/86  07/25/20 120/76  06/06/20 (!) 143/94    Lisinopril 20mg Plan44m goal, maintain therapy  Pain States related to Chemo damage 09/11/20: Didn't give pain scale but states is content at this time Baclofen 20mg TID P59mregabalin 200mg [id number]: Patient at  goal, maintain therapy  Gout Colchicine 0.36m BID 1/year Plan: Patient rarely has attacks, no preventative therapy needed at this time  Medications (Cost) -Patient states Medicaid only pays for 6 prescriptions/month -Verified with MSharyn Lull CPP at [facility], that she has never heard of this in state of  (It is true in other states). Per patient, she stated that she can do 6/month  or a doctor can call and override it. Plan: Patient will STOP paying cash for medications and start using insurance, this should save her $50+/month  Medication (Operations) -Patient states she forgets to get refills often. Has a pill box but never uses. States she accidentally takes Duloxetine 630mBID on accident instead of Duloxetine 60 QD and 3056mD Would like to get medications in packaging but has to use [facility], due to her paying cash Plan: Will work on out-of pocket expenses at this visit, will inquire about packaging next visit   Patient Self Care Activities:  . Patient will take medications as prescribed . Patient will focus on improved adherence by next week   Initial goal documentation  NatArizona Constableharm.D., Managed Medicaid Pharmacist - [phone]     Patient Care Plan: Chronic Pain (Adult)    Problem Identified: Chronic Pain Management-fibromyalgia     Long-Range Goal: Fibromyalgia pain managed-new pain management provider   Start Date: 08/14/2020  Expected End Date: 09/28/2020  This Visit's Progress: On track  Priority: High  Note:   Current Barriers:  . Care Coordination needs related to pain management provider.   Patient is experiencing fibromyalgia flair and is interested in help being connected to a pain management provider. . Unable to independently secure referral or appointment for pain management provider.  Nurse Case Manager Clinical Goal(s):  . OMarland Kitchener the next 45 days, [facility] address needs related to referral for pain management provider and associated care coordination needs.  Interventions:  . Inter-disciplinary care team collaboration (see longitudinal plan of care) . Evaluation of current treatment plan related to fibromyalgia  and patient's adherence to plan as established by provider. . CNash Dimmerth primary care provider regarding recommendations and referral to pain management provider. . Discussed plans  with patient for ongoing care management follow up and provided patient with direct contact information for care management team . Anticipate pain education program, pain management support as part of pain management referral.                 Patient Goals/Self-Care Activities Over the next 45 days, patient will:  -Attends all scheduled provider appointments Anticipate communication from PCP or pain management specialist office for new patient appointment.  develop a personal pain management plan with your pain management provider when appointment arranged.   Follow Up Plan: RN Care Manager will collaborate with PCP regarding pain management referral. The Managed Medicaid care management team will reach out to the patient again over the next 30 days.              Patient Care Plan: Wellness (Adult)    Problem Identified: Medication Adherence (Wellness)     Goal: Medication Adherence Maintained   Start Date: 09/06/2020  Expected End Date: 12/05/2020  This Visit's Progress: Not on track  Priority: High  Note:   Current Barriers:  . Patient states she needs to have prescriptions refilled and feels she has some side effects from her medications.  Nurse Case Manager Clinical Goal(s):  . OMarland Kitchener the next 30 days, patient will  work with CM team pharmacist to review current medications.  Interventions:  . Inter-disciplinary care team collaboration (see longitudinal plan of care) . Evaluation of current treatment plan and patient's adherence to plan as established by provider. . Advised patient to contact her PCP for any medication needs. . Reviewed medications with patient. [name] with pharmacy regarding medications.  . Discussed plans with patient for ongoing care management follow up and provided patient with direct contact information for care management team . Pharmacy referral for medication review  Patient Goals/Self-Care Activities Over the next 30 days, patient  will:  -Patient will take medications as prescribed. RNCM will follow up with patient within 30 days and make referral to pharmacy. Calls pharmacy for medication refills Calls provider office for new concerns or questions  Follow Up Plan: The Managed Medicaid care management team will reach out to the patient again over the next 30 days.  The patient has been provided with contact information for the Managed Medicaid care management team and has been advised to call with any health related questions or concerns.         Please see education materials related to Gout and DM provided as print materials.   Patient verbalizes understanding of instructions provided today.   The Managed Medicaid care management team will reach out to the patient again over the next 90 days.   Lane Hacker, [facility]  Gout  Gout is a condition that causes painful swelling of the joints. Gout is a type of inflammation of the joints (arthritis). This condition is caused by having too much uric acid in the body. Uric acid is a chemical that forms when the body breaks down substances called purines. Purines are important for building body proteins. When the body has too much uric acid, sharp crystals can form and build up inside the joints. This causes pain and swelling. Gout attacks can happen quickly and may be very painful (acute gout). Over time, the attacks can affect more joints and become more frequent (chronic gout). Gout can also cause uric acid to build up under the skin and inside the kidneys. What are the causes? This condition is caused by too much uric acid in your blood. This can happen because:  Your kidneys do not remove enough uric acid from your blood. This is the most common cause.  Your body makes too much uric acid. This can happen with some cancers and cancer treatments. It can also occur if your body is breaking down too many red blood cells (hemolytic anemia).  You eat too many foods that  are high in purines. These foods include organ meats and some seafood. Alcohol, especially beer, is also high in purines. A gout attack may be triggered by trauma or stress. What increases the risk? You are more likely to develop this condition if you:  Have a family history of gout.  Are female and middle-aged.  Are female and have gone through menopause.  Are obese.  Frequently drink alcohol, especially beer.  Are dehydrated.  Lose weight too quickly.  Have an organ transplant.  Have lead poisoning.  Take certain medicines, including aspirin, cyclosporine, diuretics, levodopa, and niacin.  Have kidney disease.  Have a skin condition called psoriasis. What are the signs or symptoms? An attack of acute gout happens quickly. It usually occurs in just one joint. The most common place is the big toe. Attacks often start at night. Other joints that may be affected include joints of the  feet, ankle, knee, fingers, wrist, or elbow. Symptoms of this condition may include:  Severe pain.  Warmth.  Swelling.  Stiffness.  Tenderness. The affected joint may be very painful to touch.  Shiny, red, or purple skin.  Chills and fever. Chronic gout may cause symptoms more frequently. More joints may be involved. You may also have white or yellow lumps (tophi) on your hands or feet or in other areas near your joints. How is this diagnosed? This condition is diagnosed based on your symptoms, medical history, and physical exam. You may have tests, such as:  Blood tests to measure uric acid levels.  Removal of joint fluid with a thin needle (aspiration) to look for uric acid crystals.  X-rays to look for joint damage. How is this treated? Treatment for this condition has two phases: treating an acute attack and preventing future attacks. Acute gout treatment may include medicines to reduce pain and swelling, including:  NSAIDs.  Steroids. These are strong anti-inflammatory medicines  that can be taken by mouth (orally) or injected into a joint.  Colchicine. This medicine relieves pain and swelling when it is taken soon after an attack. It can be given by mouth or through an IV. Preventive treatment may include:  Daily use of smaller doses of NSAIDs or colchicine.  Use of a medicine that reduces uric acid levels in your blood.  Changes to your diet. You may need to see a dietitian about what to eat and drink to prevent gout. Follow these instructions at home: During a gout attack   If directed, put ice on the affected area: ? Put ice in a plastic bag. ? Place a towel between your skin and the bag. ? Leave the ice on for 20 minutes, 2-3 times a day.  Raise (elevate) the affected joint above the level of your heart as often as possible.  Rest the joint as much as possible. If the affected joint is in your leg, you may be given crutches to use.  Follow instructions from your health care provider about eating or drinking restrictions. Avoiding future gout attacks  Follow a low-purine diet as told by your dietitian or health care provider. Avoid foods and drinks that are high in purines, including liver, kidney, anchovies, asparagus, herring, mushrooms, mussels, and beer.  Maintain a healthy weight or lose weight if you are overweight. If you want to lose weight, talk with your health care provider. It is important that you do not lose weight too quickly.  Start or maintain an exercise program as told by your health care provider. Eating and drinking  Drink enough fluids to keep your urine pale yellow.  If you drink alcohol: ? Limit how much you use to:  0-1 drink a day for women.  0-2 drinks a day for men. ? Be aware of how much alcohol is in your drink. In the [place] one drink equals one 12 oz bottle of beer (355 mL) one 5 oz glass of wine (148 mL), or one 1 oz glass of hard liquor (44 mL). General instructions  Take over-the-counter and prescription  medicines only as told by your health care provider.  Do not drive or use heavy machinery while taking prescription pain medicine.  Return to your normal activities as told by your health care provider. Ask your health care provider what activities are safe for you.  Keep all follow-up visits as told by your health care provider. This is important. Contact a health care provider if  you have:  Another gout attack.  Continuing symptoms of a gout attack after 10 days of treatment.  Side effects from your medicines.  Chills or a fever.  Burning pain when you urinate.  Pain in your lower back or belly. Get help right away if you:  Have severe or uncontrolled pain.  Cannot urinate. Summary  Gout is painful swelling of the joints caused by inflammation.  The most common site of pain is the big toe, but it can affect other joints in the body.  Medicines and dietary changes can help to prevent and treat gout attacks. This information is not intended to replace advice given to you by your health care provider. Make sure you discuss any questions you have with your health care provider. Document Revised: 04/15/2018 Document Reviewed: 04/15/2018 Elsevier Patient Education  [place].

## 2020-09-11 NOTE — Progress Notes (Deleted)
Patient is a 44 year old female Last visit with me was 07/20/2020 Follows up today.  She did follow-up in the emergency room after a foot contusion at the end of November, with no fractures noted on the imaging studies.  She has a complex past medical history including a prior liver transplant and post transplant lymphoma, still seeing Dr. Janese Banks and Dr. Allen Norris for follow-up, and treated by Dr. Shea Evans for bipolar/PTSD with last visit 08/10/2020, also sees chronic pain with trigeminal neuralgia concerns, in addition to the other issues noted upon review on prior notes.       She notes for the past month, she has had intermittent pain is more in her left suprapubic region and in her abdomen.  She had no trauma before this came on.  Is not bothersome today.  She feels the discomfort in the left suprapubic area with no swelling noted, no change in bowel habits, including no diarrhea or constipation, denies any dark or black stools, no bleeding per rectum.  Has had no fevers or nausea or vomiting.  She has not had a period in years, although has a history of ovarian cyst, that just went away with no treatment initiated for that previously.  She is followed by [facility], and is due for a visit with them in a couple months.  No recent vaginal symptoms of concern. She has no inguinal hernia history, although has an umbilical hernia history. 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  Denies any dysuria, no hematuria.  No flank pains.   NAD, masked, pleasant, not ill-appearing HEENT - [place]/AT, sclera anicteric, PERRL, EOMI, conj - non-inj'ed, pharynx clear Neck - supple, no adenopathy, no rigidity Car - RRR without m/g/r, not tachycardic Pulm- RR and effort normal at rest, CTA without wheeze or rales Abd - soft,obese,NTdiffusely, ND, positive bowel  sounds, no obvious masses, nontender in the left suprapubic area below the waistline towards the inguinal ligament where she feels her discomfort.  No suprapubic swelling, No pain with bringing her knee to her chest on the left while supine, nor with straight leg elevation. Back - no CVA tenderness Neuro/psychiatric - affect was not flat, appropriate with conversation Alert with normal speech   1. Bipolar I disorder, most recent episode depressed ([place]) PHQ-9 reviewed, continues to see Dr.[name] from psychiatry presently  2. Suprapubic pain Exact source unclear, and does not involve the left lower quadrant of the abdomen.  She has a history of an ovarian cyst, and question if this is a possible source.  No obvious hernia on exam, although has a history of hernia in her abdomen.  No change in bowel habits of concern.  She was asymptomatic today on exam. Did feel best getting a urine, and a urine dip was negative for any infectious concerns, did have some mild protein. Felt best to monitor presently, and noted if symptoms return, she should call her OB/GYN group that has been following her, and schedule an appointment sooner than the planned follow-up in a couple months time.  She was in agreement with this.

## 2020-09-12 ENCOUNTER — Ambulatory Visit: Payer: Medicaid Other | Admitting: Internal Medicine

## 2020-09-12 ENCOUNTER — Telehealth (INDEPENDENT_AMBULATORY_CARE_PROVIDER_SITE_OTHER): Payer: Medicaid Other | Admitting: Internal Medicine

## 2020-09-12 ENCOUNTER — Encounter: Payer: Self-pay | Admitting: Internal Medicine

## 2020-09-12 DIAGNOSIS — R3915 Urgency of urination: Secondary | ICD-10-CM

## 2020-09-12 MED ORDER — SULFAMETHOXAZOLE-TRIMETHOPRIM 800-160 MG PO TABS: 1.0000 | ORAL_TABLET | Freq: Two times a day (BID) | ORAL | 0 refills | Status: AC

## 2020-09-12 NOTE — Progress Notes (Signed)
Name: Alison [name]   MRN: [id number]    DOB: 1975-12-30   Date:09/12/2020       Progress Note  Subjective  Chief Complaint  Chief Complaint  Patient presents with  . Urinary Urgency    urine is very cloudy and has an odor.  Denies abdominal pain and back pain. Has slight dysuria.     I connected with  Brittni Hult on 09/12/20 at 11:20 AM EST by telephone and verified that I am speaking with the correct person using two identifiers.  I discussed the limitations, risks, security and privacy concerns of performing an evaluation and management service by telephone and the availability of in person appointments. The patient expressed understanding and agreed to proceed. Staff also discussed with the patient that there may be a patient responsible charge related to this service. Patient Location: Home Provider Location: [facility] Additional Individuals present: none  HPI  Patient is a 44 year old female Lastvisit with me was 07/20/2020 Follows up today with a phone visit.  She did follow-up in the emergency room after a foot contusion at the end of November, with no fractures noted on the imaging studies.  She has a complex past medical history including a prior liver transplant and post transplant lymphoma, still seeing Dr. Janese Banks and Dr. Allen Norris for follow-up,and treated by Dr. Shea Evans for bipolar/PTSD with last visit 08/10/2020, also sees chronic pain with trigeminal neuralgia concerns, in addition totheother issues noted upon review on prior notes.  She notes that her urine has been very cloudy and has an odor past 4-5 days. Denies fevers, no flank pains, no abdominal pains, hurts a little when pees, has urgency and a couple times not made it to the toilet. Trying to stay hydrated Last bladder infection about 4 months ago. Saw urology in July and Cipro initiated for a recurrent UTI concern.  She noted she has been on Bactrim in the past as well. Car won't start and could not get into  the office.    All - PCN, carbamazepine, hydrocodone-acetaminophen, naproxen   Patient Active Problem List   Diagnosis Date Noted  . Suprapubic pain 07/25/2020  . Bipolar 1 disorder, depressed ([place]) 07/10/2020  . 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 Edema of lower extremity 09/16/2019  . Carpal tunnel syndrome, left 07/26/2019  . Cubital tunnel syndrome on left 07/26/2019  . Bipolar I disorder, most recent episode depressed ([place]) 07/12/2019  . PTSD (post-traumatic stress disorder) 07/12/2019  . Umbilical hernia without obstruction and without gangrene 06/17/2019  . Encounter for surveillance of abnormal nevi 06/17/2019  . Pineal gland cyst 06/17/2019  . Chronic hip pain, left 06/02/2019  . Lumbar spondylosis 06/02/2019  . Mouth dryness 06/02/2019  . Obesity (BMI 35.0-39.9 without comorbidity) 06/02/2019  . Goals of care, counseling/discussion 05/18/2019  . 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[place]) 05/18/2019  . Occipital neuralgia 05/13/2019  . Plantar fasciitis of left foot 04/27/2019  . Fibromyalgia 03/23/2019  . Systemic lupus erythematosus (SLE) in adult ([place]) 03/23/2019  . Essential hypertension 03/23/2019  . Hepatitis 03/23/2019  . Mass of left kidney 03/23/2019  . Diabetes mellitus ([place]) 03/23/2019  . Nausea without vomiting 03/23/2019  . Neuropathy 03/23/2019  . Cancer ([place]) 03/23/2019  . Hyperlipidemia 03/23/2019  . Osteoarthritis 03/23/2019  . Trigeminal neuralgia 03/23/2019  . Chronic renal disease, stage III ([place]) 03/23/2019  . Nephrolithiasis 03/23/2019  . Migraine 03/23/2019  . OSA on CPAP 03/23/2019  .  Fibrocystic breast 03/23/2019  . Heart murmur 03/23/2019  . Bipolar disorder, in partial remission, most recent episode depressed ([place]) 07/02/2017  . Abnormal uterine bleeding 03/18/2017  . Major depressive disorder, recurrent ([place]) 03/18/2017   . Morbid obesity ([place]) 03/18/2017  . Secondary hyperparathyroidism ([place]) 02/07/2017  . Neuropathic pain 11/19/2016  . Post herpetic neuralgia 11/19/2016  . 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[place]) 01/13/2015  . Lumbar disc disease with radiculopathy 07/26/2013  . S/P liver transplant ([place]) 07/26/2013  . Type 2 diabetes mellitus, uncontrolled ([place]) 07/26/2013  . Facial nerve disorder 06/29/2012  . Low back pain 12/26/2010    Past Surgical History:  Procedure Laterality Date  . BONE MARROW BIOPSY  01/13/2015  . BREAST BIOPSY  12/2014  . BREAST BIOPSY  2011  . BREAST SURGERY    . CHOLECYSTECTOMY    . HERNIA REPAIR    . infusaport    . LIVER TRANSPLANT  12/17/1991  . LUMBAR PUNCTURE    . PORT A CATH INJECTION ([place] [facility])    . tumor removal  2015    Family History  Problem Relation Age of Onset  . Heart disease Mother   . Hypertension Mother   . Cancer - Other Mother   . Bipolar disorder Mother   . Heart disease Father   . Hypertension Father   . Diabetes Father   . Parkinson's disease Maternal Grandmother   . Cancer Maternal Aunt   . Cancer Maternal Uncle   . Cancer Maternal Grandfather   . Lupus Paternal Grandmother   . Hypertension Brother     Social History   Tobacco Use  . Smoking status: Never Smoker  . Smokeless tobacco: Never Used  Substance Use Topics  . Alcohol use: Not Currently     Current Outpatient Medications:  .  baclofen (LIORESAL) 20 MG tablet, Take 1 tablet (20 mg total) by mouth 3 (three) times daily as needed for muscle spasms., Disp: 90 each, Rfl: 2 .  benztropine (COGENTIN) 0.5 MG tablet, Take 1 tablet (0.5 mg total) by mouth daily as needed for tremors., Disp: 30 tablet, Rfl: 1 .  colchicine 0.6 MG tablet, Take 0.6 mg by mouth 2 (two) times daily. Takes once daily, Disp: , Rfl:  .  DULoxetine (CYMBALTA) 30 MG capsule, Take 1 capsule (30 mg total) by mouth daily. To be combined with 60 mg daily, Disp: 90 capsule, Rfl: 0 .  DULoxetine  (CYMBALTA) 60 MG capsule, Take 1 capsule (60 mg total) by mouth daily. To be combined with 30 mg daily, Disp: 90 capsule, Rfl: 0 .  glucose blood (PRECISION QID TEST) test strip, Use once daily Use as instructed., Disp: , Rfl:  .  hydrOXYzine (VISTARIL) 25 MG capsule, Take 1 capsule (25 mg total) by mouth 2 (two) times daily as needed. For sever anxiety attacks, Disp: 60 capsule, Rfl: 1 .  Lactobacillus (PROBIOTIC ACIDOPHILUS PO), Take by mouth., Disp: , Rfl:  .  lamoTRIgine (LAMICTAL) 25 MG tablet, Take 1 tablet (25 mg total) by mouth daily., Disp: 90 tablet, Rfl: 0 .  Lidocaine 0.5 % AERO, Apply topically., Disp: , Rfl:  .  liraglutide (VICTOZA) 18 MG/3ML SOPN, Inject into the skin., Disp: , Rfl:  .  lisinopril (ZESTRIL) 20 MG tablet, Take 1 tablet (20 mg total) by mouth daily., Disp: 90 tablet, Rfl: 3 .  Magnesium Bisglycinate (MAG GLYCINATE) 100 MG TABS, Take by mouth. , Disp: , Rfl:  .  magnesium oxide (MAG-OX) 400 MG tablet, Take  by mouth., Disp: , Rfl:  .  Melatonin 5 MG CAPS, Take 1-2 capsules (5-10 mg total) by mouth at bedtime., Disp: 100 capsule, Rfl: 0 .  metFORMIN (GLUCOPHAGE) 1000 MG tablet, Take 1 tablet by mouth 2 (two) times a day., Disp: , Rfl:  .  promethazine (PHENERGAN) 25 MG tablet, Take 1 tablet by mouth as needed. , Disp: , Rfl:  .  QUEtiapine (SEROQUEL XR) 300 MG 24 hr tablet, Take 1 tablet (300 mg total) by mouth at bedtime., Disp: 90 tablet, Rfl: 0 .  Specialty Vitamins Products (MAGNESIUM, AMINO ACID CHELATE,) 133 MG tablet, Take 1 tablet by mouth 2 (two) times daily., Disp: , Rfl:  .  SUMAtriptan (IMITREX) 100 MG tablet, TAKE 1 TABLET BY MOUTH ONCE AS NEEDED FOR MIGRAINE. MAY TAKE A SECOND DOSE AFTER 2 HOURS IF NEEDED, Disp: , Rfl:  .  tacrolimus (PROGRAF) 1 MG capsule, Take 1 capsule by mouth once daily, Disp: 30 capsule, Rfl: 0 .  vitamin B-12 (CYANOCOBALAMIN) 1000 MCG tablet, Take 1,000 mcg by mouth daily. Takes 5000 mcg, Disp: , Rfl:  .  XTAMPZA ER 9 MG C12A, Take 1  capsule by mouth 2 (two) times daily., Disp: , Rfl:  .  Cranberry 500 MG TABS, Take by mouth.  (Patient not taking: Reported on 09/12/2020), Disp: , Rfl:  .  pregabalin (LYRICA) 200 MG capsule, Take 1 capsule (200 mg total) by mouth 3 (three) times daily., Disp: 90 capsule, Rfl: 2  Allergies  Allergen Reactions  . Penicillin G Itching, Rash, Anaphylaxis and Palpitations  . Carbamazepine Other (See Comments)    Medication interaction-prograf  . Hydrocodone-Acetaminophen Itching  . Naproxen Itching    With staff assistance, above reviewed with the patient today.  ROS: As per HPI, otherwise no specific complaints on a limited and focused system review   Objective  Virtual encounter, vitals not obtained.  There is no height or weight on file to calculate BMI.  Physical Exam   Appears in NAD via conversation Breathing: No obvious respiratory distress. Speaking in complete sentences Neurological: Pt is alert, Speech is normal Psychiatric: Patient has a normal mood and affect. Judgment and thought content normal.   No results found for this or any previous visit (from the past 72 hour(s)).  PHQ2/9: Depression screen [facility] 2/9 09/12/2020 07/25/2020 06/05/2020 09/23/2019 09/16/2019  Decreased Interest _0 Down, Depressed, Hopeless _1 PHQ - 2 Score _2 Altered sleeping 0 _3 0  Tired, decreased energy _4 Change in appetite 0 _5 Feeling bad or failure about yourself  0 2 1 0 0  Trouble concentrating _6 Moving slowly or fidgety/restless 0 0 0 2 0  Suicidal thoughts 0 0 0 0 0  PHQ-9 Score _7 Difficult doing work/chores Somewhat difficult Very difficult Very difficult - Not difficult at all  Some recent data might be hidden   PHQ-2/9 Result reviewed  Fall Risk: Fall Risk  09/12/2020 07/25/2020 06/05/2020 09/23/2019 09/16/2019  Falls in the past year? 1 0 1 1 0  Number falls in past yr: 0 0 1 1 0  Comment - - 6 - -  Injury with  Fall? 1 0 1 0 0  Risk for fall due to : - - - History of fall(s) -  Follow up - - Falls evaluation completed  Falls prevention discussed Falls evaluation completed     Assessment & Plan  1. Urgency of urination Do feel she has symptoms consistent with a bladder infection, and she does have a history of these noted as well.  Ideally, asked if she could drop off a urine sample to send for a urinalysis and culture this afternoon, and she states she will try to do so.  Those tests were ordered. We will then start a Bactrim DS product-take 1 twice daily for 5 days recommended, and did note the concerns with the quinolones presently, and feel best utilizing the Bactrim DS product presently. If her symptoms are not improved or more problematic over time, she does need to follow-up, and hope to have a urine culture result to help as well. Also emphasized continuing to stay well-hydrated.  - sulfamethoxazole-trimethoprim (BACTRIM DS) 800-160 MG tablet; Take 1 tablet by mouth 2 (two) times daily for 5 days.  Dispense: 10 tablet; Refill: 0 - Urinalysis, Complete - Urine Culture  I discussed the assessment and treatment plan with the patient. The patient was provided an opportunity to ask questions and all were answered. The patient agreed with the plan and demonstrated an understanding of the instructions.   The patient was advised to call back or seek an in-person evaluation if the symptoms worsen or if the condition fails to improve as anticipated.  I provided 15 minutes of non-face-to-face time during this encounter that included discussing at length patient's sx/history, pertinent pmhx, medications, treatment and follow up plan. This time also included the necessary documentation, orders, and chart review.  Towanda Malkin, MD

## 2020-09-15 ENCOUNTER — Other Ambulatory Visit: Payer: Self-pay

## 2020-09-15 NOTE — Patient Instructions (Signed)
Visit Information  Ms. Latasha Buczkowski  - as a part of your Medicaid benefit, you are eligible for care management and care coordination services at no cost or copay. I was unable to reach you by phone today but would be happy to help you with your health related needs. Please feel free to call me @ [phone].   A member of the Managed Medicaid care management team will reach out to you again over the next 7 days.   Mickel Fuchs, [name], [place]  High Risk Managed Medicaid Team

## 2020-09-15 NOTE — Patient Outreach (Signed)
Care Coordination  09/15/2020  Alison [name] 06/19/1976 [id number]   Medicaid Managed Care   Unsuccessful Outreach Note  09/15/2020 Name: Alison [name] MRN: [id number] DOB: 03-31-1976  Referred by: Towanda Malkin, MD Reason for referral : High Risk Managed Medicaid (HR MM Unsuccessful Telephone Outreach)   An unsuccessful telephone outreach was attempted today. The patient was referred to the case management team for assistance with care management and care coordination.   Follow Up Plan: [name] will follow up in 30 days  Mickel Fuchs, [name], [place] Medicaid Team

## 2020-09-22 ENCOUNTER — Other Ambulatory Visit: Payer: Self-pay | Admitting: Obstetrics and Gynecology

## 2020-09-22 ENCOUNTER — Other Ambulatory Visit: Payer: Self-pay

## 2020-09-22 NOTE — Progress Notes (Signed)
Patient ID: Alison [name], female    DOB: [birth year], 44 y.o.   MRN: [id number]  PCP: Towanda Malkin, MD  Chief Complaint  Patient presents with  . Follow-up    Subjective:   Alison [name] is a 44 y.o. female, presents to clinic with CC of the following:  Chief Complaint  Patient presents with  . Follow-up    HPI:  Patient is a 44 year old female Follows up today after my first visit with her 06/05/2020 Communication with lab results after that visit was as follows:  The lab results from yesterday were reviewed. Please share these lab results with the transplant group, endocrinology, as well as other providers that you will be seeing here in the near future. The complete metabolic panel showed that your glucose was high at 208, and the liver transaminases (AST and ALT) were slightly high at 55 and 44, although that is improved from 4 months ago when they were 93 and 62 respectively. The remainder of the complete metabolic panel was normal. The A1c was increased to 8.9, indicative of your blood sugars not being well controlled. You noted you were not taking the Victoza product that endocrinology had started, and is important to start taking that as prescribed. It is also important to call endocrinology and make a follow-up appointment as they wanted to see you after your last visit. The complete blood count had a slightly low platelet count at 132,000, and a slightly high absolute eosinophil count, with the complete blood count remaining stable. The lipid panel showed a high triglyceride at 348, a high LDL (lousy type) of cholesterol at 111, and a low HDL (healthy type) of cholesterol at 36. Noting your diabetes, a statin product would be a great medicine to be on, although it can have some liver side effects, and I would ask the transplant group their thoughts on starting a statin to help improve your lipid panel. The goal is to have the LDL cholesterol less than 70.  Also, dietary modifications are important (trying to lessen fats, especially saturated fats in your diet), and increasing physical activity is also important to help.  All in all, feeling good.  Last visit was a phone visit on 09/12/2020 for UTI, she notes symptoms have improved, denies any current symptoms presently  She did follow-up in the emergency room after a foot contusion at the end of November, with no fractures noted on the imaging studies. Much better presently.  Bipolar I/PTSD: Seeing Dr. Shea Evans, last visit 08/10/2020 with details of that visit not available to view Medication regimen- off ofativan- takinghydroxyzine PRN, seroquel at night; cymbalta and 10 mg melatonin. She feels like her mood had beenimproving with counseling ([facility]) and medication, although notes in the recent past, she has struggled more and continues with the counseling and psychiatry input. Is anniversary of mothers death at this time and struggles some with this.  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 She was ill at age 15 and had a liver transplant at age 40.  She states that her chronic pain is related to chemo-related damage- seeing pain management for this. Last follow-up with transplant was in early June and noted the following: Reviewed 6/2 MRI abdomen with Dr. Manuella Ghazi. No interventions recommended. Will continue monitoring monthly labs. If LFTs continue to rise MD stated he would consider a TJ liver biopsy with pressures.    Marginal B-cell Lymphoma (Stage  II Extranodal): Seeing Dr. Janese Banks, last visit on 06/06/2020 with the assessment/plan as follows: (sees her again tomorrow)   Assessment and plan- Patient is a 44 y.o. 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 This is a routine follow-up  visit Clinically patient is doing well with no concerning signs and symptoms of recurrence based on today's exam. Her labs are unremarkable except for mild intermittent thrombocytopenia which has remained stable overall.Patient also noted to have mildly abnormal LFTs based on her recent labs which were better as compared to April 2021 and she follows up with the [facility] transplant team. She has received her booster dose of Covid vaccine as well.  I will see her back in 6 months with CBC with differential CMP and LDH  Trigeminal Neuralgia and Chronic Pain: Saw neurosurgery in March 2021 with the following assessment/plan:     I described the nature of trigeminal neuralgia and its management in detail including ongoing medication, microvascular decompression (MVD), percutaneous trigeminal rhizotomy using balloon compression (PTR), and Gamma Knife radiosurgery (GKRS). Because of her imaging findings and medical comorbidities, I recommend GKRS for her. [name] demonstrated good understanding and requests that we proceed with GKRS. Arrangements will be made for her to undergo GKRS on the next available treatment date. Arrangements were made with radiation oncology to proceed in April, although that appointment was canceled. Agreed to wait on GKRS, as could exacerbate the conditon and was better  She switched from Dr. Holley Raring to Dr. Humphrey Rolls for pain management and is happy with this change.  She has also seen neurology(Dr. Shah)for trigeminal neuralgia pain.  Sheis taking cymbalta.She is still taking Lyrica 243m TIDand hasbaclofen that she takes BID, with 1 additional dose PRN.  HTN:  Medication regimen-lisinopril 262mdaily;  Does check blood pressures at home when gets HA, not check recently BP Readings from Last 3 Encounters:  09/26/20 100/64  08/29/20 114/86  07/25/20 120/76    No chest pain, palpitations, shortness of breath, or increased headaches, vision changes  Diabetes  mellitustype 2: Followed by endocrinology, with last visit in March 2021 with the following assessment/plan noted: (Seeing on 29th of Dec for f/u).     Assessment/Plan: Diabetes -  - We discussed diabetes, its complications, and goals of care. Patient was advised of the importance of diet and behavioral changes to improve glycemic control.  - Diabetes is poorly controlled likely due to lack of physical exercise activity and dietary indiscretions. -I will adjust her diabetic medication regimen as follows: - Continue metformin 1000 mg twice daily. _Add Victoza 0.6 mg daily - not taking like supposed to   Patient was advised of the importance of regular monitoring of blood sugars.  - Instructed to monitor blood sugars fasting each morning. A prescription for a new testing supplies will be sent to patient's local pharmacy. - Reminded to bring blood sugar log and/or meter to every visit  Morbid obesity -  Patient was advised of the importance of a low carbohydrate diet. Patient was advised of the importance of achieving and maintaining a healthy body weight. She is encouraged to lose weight and is advised to follow a low carbohydrate diet with intake of lean proteins, supplemental vegetables and low-fat food choices. The importance of portion control is emphasized. Patient is encouraged to participate in regular exercise activity, striving for 30 minutes of sustained exercise activity 4-5 times weekly.  Hypertension /CKD 3-  Blood pressure  is well controlled. Patient is advised to therapy lisinopril blood pressure control/renal protection. Follow-up with Dr. Juleen China in Nephrology as scheduled  Return to clinic in 6 weeks for follow up, but has not followed up since that visit. Noted last visit needed to schedule follow-up.  Has not followed up with them since. Not been checking sugars at home in recent past except a rare check.   Pineal Gland Cyst& Secondary Hyperparathyroidism: Was diagnosed  when living in [place]; was told to simply monitor but not additional testing or evaluation.Followed by Endocrinology  Obesity:  Wt Readings from Last 3 Encounters:  09/26/20 266 lb 9.6 oz (120.9 kg)  08/29/20 269 lb (122 kg)  07/25/20 269 lb (122 kg)   Weight stable  Saw nutrition on 02/10/2020 with recommendations provided She can use to try to watch her diet, and weight has been relatively stable recent past  LLE edema: Followed by vascular, Dr. Lucky Cowboy, with last visit 04/18/2020 and with improvements noted at that time and have continued. Noted hypertension, diabetes, chronic kidney disease, and her lymphoma history could be contributors  Gout -was seen 04/05/2020 at [facility] clinic for an acute gout flare of her toe, treated with prednisone. Seen again two weeks ago at walk in clinic for this concern, and put on colchicine and improved    FMS/SLE/DDD: She was diagnosed with FMS for about 10 years, SLE for about 17 years. She states she has likely had SLE since age 83.  Taking lyricaand doing okay on this, off of all opiate therapies.  She is seeingDr. Chancy Milroy now for pain management as above.  OSA: She has OSA, but does not wear her CPAP because of her trigeminal neuralgia.  Recurrent UTI/Overactive Bladder: Currently followed by urology for this, with her last visit in mid July 2021 with the following noted             1. Recurrent UTI 2-week history of dysuria, urine consistent with infection. Will send for culture for further evaluation and start empiric Cipro. Counseled patient that I would contact her if her urine culture results indicate a necessary change in therapy. She expressed understanding. - Urinalysis, Complete - CULTURE, URINE COMPREHENSIVE - ciprofloxacin (CIPRO) 250 MG tablet; Take 1 tablet (250 mg total) by mouth 2 (two) times daily for 5 days. Dispense: 10 tablet; Refill: 0 Return if worsening or symptoms fail to improve  Did just have a recent UTI  again in early December  Umbilical Hernia:She notes multiple repairs in the past     Patient Active Problem List   Diagnosis Date Noted  . Suprapubic pain 07/25/2020  . Bipolar 1 disorder, depressed ([place]) 07/10/2020  . 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 Edema of lower extremity 09/16/2019  . Carpal tunnel syndrome, left 07/26/2019  . Cubital tunnel syndrome on left 07/26/2019  . Bipolar I disorder, most recent episode depressed ([place]) 07/12/2019  . PTSD (post-traumatic stress disorder) 07/12/2019  . Umbilical hernia without obstruction and without gangrene 06/17/2019  . Encounter for surveillance of abnormal nevi 06/17/2019  . Pineal gland cyst 06/17/2019  . Chronic hip pain, left 06/02/2019  . Lumbar spondylosis 06/02/2019  . Mouth dryness 06/02/2019  . Obesity (BMI 35.0-39.9 without comorbidity) 06/02/2019  . Goals of care, counseling/discussion 05/18/2019  . 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[place]) 05/18/2019  . 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[place]) 05/18/2019  . Occipital neuralgia 05/13/2019  . Plantar fasciitis of left foot 04/27/2019  . Fibromyalgia 03/23/2019  .  Systemic lupus erythematosus (SLE) in adult ([place]) 03/23/2019  . Essential hypertension 03/23/2019  . Hepatitis 03/23/2019  . Mass of left kidney 03/23/2019  . Diabetes mellitus ([place]) 03/23/2019  . Nausea without vomiting 03/23/2019  . Neuropathy 03/23/2019  . Cancer ([place]) 03/23/2019  . Hyperlipidemia 03/23/2019  . Osteoarthritis 03/23/2019  . Trigeminal neuralgia 03/23/2019  . Chronic renal disease, stage III ([place]) 03/23/2019  . Nephrolithiasis 03/23/2019  . Migraine 03/23/2019  . OSA on CPAP 03/23/2019  . Fibrocystic breast 03/23/2019  . Heart murmur 03/23/2019  . Bipolar disorder, in partial remission, most recent episode depressed ([place]) 07/02/2017  . Abnormal uterine bleeding 03/18/2017  . Major depressive disorder, recurrent ([place])  03/18/2017  . Morbid obesity ([place]) 03/18/2017  . Secondary hyperparathyroidism ([place]) 02/07/2017  . Neuropathic pain 11/19/2016  . Post herpetic neuralgia 11/19/2016  . 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[place]) 01/13/2015  . Lumbar disc disease with radiculopathy 07/26/2013  . S/P liver transplant ([place]) 07/26/2013  . Type 2 diabetes mellitus, uncontrolled ([place]) 07/26/2013  . Facial nerve disorder 06/29/2012  . Low back pain 12/26/2010      Current Outpatient Medications:  .  baclofen (LIORESAL) 20 MG tablet, Take 1 tablet (20 mg total) by mouth 3 (three) times daily as needed for muscle spasms., Disp: 90 each, Rfl: 2 .  benztropine (COGENTIN) 0.5 MG tablet, Take 1 tablet (0.5 mg total) by mouth daily as needed for tremors., Disp: 30 tablet, Rfl: 1 .  colchicine 0.6 MG tablet, Take 0.6 mg by mouth 2 (two) times daily. Takes once daily, Disp: , Rfl:  .  Cranberry 500 MG TABS, Take by mouth., Disp: , Rfl:  .  DULoxetine (CYMBALTA) 30 MG capsule, Take 1 capsule (30 mg total) by mouth daily. To be combined with 60 mg daily, Disp: 90 capsule, Rfl: 0 .  DULoxetine (CYMBALTA) 60 MG capsule, Take 1 capsule (60 mg total) by mouth daily. To be combined with 30 mg daily, Disp: 90 capsule, Rfl: 0 .  glucose blood (PRECISION QID TEST) test strip, Use once daily Use as instructed., Disp: , Rfl:  .  hydrOXYzine (VISTARIL) 25 MG capsule, Take 1 capsule (25 mg total) by mouth 2 (two) times daily as needed. For sever anxiety attacks, Disp: 60 capsule, Rfl: 1 .  Lactobacillus (PROBIOTIC ACIDOPHILUS PO), Take by mouth., Disp: , Rfl:  .  lamoTRIgine (LAMICTAL) 25 MG tablet, Take 1 tablet (25 mg total) by mouth daily., Disp: 90 tablet, Rfl: 0 .  Lidocaine 0.5 % AERO, Apply topically., Disp: , Rfl:  .  liraglutide (VICTOZA) 18 MG/3ML SOPN, Inject into the skin., Disp: , Rfl:  .  lisinopril (ZESTRIL) 20 MG tablet, Take 1 tablet (20 mg total) by mouth daily., Disp: 90 tablet, Rfl: 3 .  Magnesium Bisglycinate  (MAG GLYCINATE) 100 MG TABS, Take by mouth. , Disp: , Rfl:  .  magnesium oxide (MAG-OX) 400 MG tablet, Take by mouth., Disp: , Rfl:  .  Melatonin 5 MG CAPS, Take 1-2 capsules (5-10 mg total) by mouth at bedtime., Disp: 100 capsule, Rfl: 0 .  metFORMIN (GLUCOPHAGE) 1000 MG tablet, Take 1 tablet by mouth 2 (two) times a day., Disp: , Rfl:  .  oxyCODONE ER 13.5 MG C12A, Take 1 capsule by mouth 2 (two) times daily., Disp: , Rfl:  .  promethazine (PHENERGAN) 25 MG tablet, Take 1 tablet by mouth as needed. , Disp: , Rfl:  .  QUEtiapine (SEROQUEL XR) 300 MG 24 hr tablet, Take 1 tablet (  300 mg total) by mouth at bedtime., Disp: 90 tablet, Rfl: 0 .  Specialty Vitamins Products (MAGNESIUM, AMINO ACID CHELATE,) 133 MG tablet, Take 1 tablet by mouth 2 (two) times daily., Disp: , Rfl:  .  SUMAtriptan (IMITREX) 100 MG tablet, TAKE 1 TABLET BY MOUTH ONCE AS NEEDED FOR MIGRAINE. MAY TAKE A SECOND DOSE AFTER 2 HOURS IF NEEDED, Disp: , Rfl:  .  tacrolimus (PROGRAF) 1 MG capsule, Take 1 capsule by mouth once daily, Disp: 30 capsule, Rfl: 0 .  vitamin B-12 (CYANOCOBALAMIN) 1000 MCG tablet, Take 1,000 mcg by mouth daily. Takes 5000 mcg, Disp: , Rfl:  .  pregabalin (LYRICA) 200 MG capsule, Take 1 capsule (200 mg total) by mouth 3 (three) times daily., Disp: 90 capsule, Rfl: 2   Allergies  Allergen Reactions  . Penicillin G Itching, Rash, Anaphylaxis and Palpitations  . Carbamazepine Other (See Comments)    Medication interaction-prograf  . Hydrocodone-Acetaminophen Itching  . Naproxen Itching     Past Surgical History:  Procedure Laterality Date  . BONE MARROW BIOPSY  01/13/2015  . BREAST BIOPSY  12/2014  . BREAST BIOPSY  2011  . BREAST SURGERY    . CHOLECYSTECTOMY    . HERNIA REPAIR    . infusaport    . LIVER TRANSPLANT  12/17/1991  . LUMBAR PUNCTURE    . PORT A CATH INJECTION ([place] HX)    . tumor removal  2015     Family History  Problem Relation Age of Onset  . Heart disease Mother   .  Hypertension Mother   . Cancer - Other Mother   . Bipolar disorder Mother   . Heart disease Father   . Hypertension Father   . Diabetes Father   . Parkinson's disease Maternal Grandmother   . Cancer Maternal Aunt   . Cancer Maternal Uncle   . Cancer Maternal Grandfather   . Lupus Paternal Grandmother   . Hypertension Brother      Social History   Tobacco Use  . Smoking status: Never Smoker  . Smokeless tobacco: Never Used  Substance Use Topics  . Alcohol use: Not Currently    With staff assistance, above reviewed with the patient today.  ROS: As per HPI, otherwise no specific complaints on a limited and focused system review   No results found for this or any previous visit (from the past 72 hour(s)).   PHQ2/9: Depression screen [facility] 2/9 09/26/2020 09/12/2020 07/25/2020 06/05/2020 09/23/2019  Decreased Interest 3 2 3 3 3   Down, Depressed, Hopeless 3 1 3 3 3   PHQ - 2 Score 6 3 6 6 6   Altered sleeping 3 0 3 3 3   Tired, decreased energy 3 2 2 3 3   Change in appetite 2 0 2 2 3   Feeling bad or failure about yourself  1 0 2 1 0  Trouble concentrating 2 2 3 3 2   Moving slowly or fidgety/restless 0 0 0 0 2  Suicidal thoughts 0 0 0 0 0  PHQ-9 Score 17 7 18 18 19   Difficult doing work/chores Somewhat difficult Somewhat difficult Very difficult Very difficult -  Some recent data might be hidden   PHQ-2/9 Result reviewed, sees psychiatry  Fall Risk: Fall Risk  09/26/2020 09/12/2020 07/25/2020 06/05/2020 09/23/2019  Falls in the past year? 1 1 0 1 1  Number falls in past yr: 1 0 0 1 1  Comment - - - 6 -  Injury with Fall? 1 1 0 1 0  Risk for fall due to : - - - - History of fall(s)  Follow up - - - Falls evaluation completed Falls prevention discussed      Objective:   Vitals:   09/26/20 1056  BP: 100/64  Pulse: (!) 106  Resp: 16  Temp: 97.8 F (36.6 C)  TempSrc: Oral  SpO2: 97%  Weight: 266 lb 9.6 oz (120.9 kg)  Height: 5' 6"  (1.676 m)    Body mass index is  43.03 kg/m.  Physical Exam   NAD, masked, pleasant HEENT - [place]/AT, sclera anicteric, PERRL, EOMI, conj - non-inj'ed,  pharynx clear Neck - supple, no adenopathy, no TM, carotids 2+ and equal without bruits Car - RRR without m/g/r, borderline tachycardic with a heart rate approximately 96 on my exam Pulm- RR and effort normal at rest, CTA without wheeze or rales Abd - soft, obese, NT diffusely, ND,  Back - no CVA tenderness Ext -trace LE edema, with subtle indentations at the upper sock line noted, no active joints Neuro/psychiatric - affect was not flat, appropriate with conversation             Alert with normal speech             Grossly non-focal -adequate strength in the extremities, sensation intact to LT in distal extremities                Results for orders placed or performed in visit on 07/25/20  POCT urinalysis dipstick  Result Value Ref Range   Color, UA yellow    Clarity, UA clear    Glucose, UA Negative Negative   Bilirubin, UA Negative    Ketones, UA Negative    Spec Grav, UA 1.020 1.010 - 1.025   Blood, UA Negative    pH, UA 7.0 5.0 - 8.0   Protein, UA Positive (A) Negative   Urobilinogen, UA 0.2 0.2 or 1.0 E.U./dL   Nitrite, UA Negative    Leukocytes, UA Negative Negative   Appearance Abnormal    Odor Normal    Prior labs reviewed    Assessment & Plan:      1.  Recent left foot contusion Was seen in the emergency department, with imaging negative, Has improved.  2. S/P liver transplant ([place]) Still followed by transplant and continue She did have a booster for Covid in August She noted she gets labs checked by them regularly, and goes to [facility] to have that done.   3. 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 Janese Banks and continue  4. Obesity (BMI 35.0-39.9 without comorbidity) Her weight has been relatively stable in the recent past. Has not had a lot of success trying to lose  weight.  5. Essential hypertension Blood pressure remains controlled Continue the lisinopril  6.  Hyperlipidemia Last lipid panel was reviewed Did note concerns with adding a statin with her liver transplant history, and if a consideration, will ask transplant team to weigh in on that potential option before starting  7. Bipolar I disorder, most recent episode depressed ([place]) Continue with psychiatry and with counseling input, and the medications prescribed by psychiatry.  8. Edema of lower extremity She notes has been better in the recent past, as did Dr. Lucky Cowboy on his last follow-up.  9. Trigeminal neuralgia Did not proceed with the planned procedure as it was improved, and concerns with potential exacerbation of her symptoms with the procedure entertained.  Continuing to monitor presently.  10. Secondary hyperparathyroidism ([place]) 11. Pineal gland cyst Continue with endocrinology follow-up and input.  12. Overactive bladder Has been followed by urology and continue with their input  13. OSA (obstructive sleep apnea) She does not tolerate the CPAP with the trigeminal neuralgia history. She today noted that her machine had recently been recalled.  14. Type 2 diabetes mellitus with hyperglycemia, without long-term current use of insulin ([place]) Discussed the importance of continuing to see endocrinology last visit, as she had not been taking the Trulicity product recommended and her A1c notes she is not well controlled. Again today emphasized the importance of following up with endocrine, and she notes she has an appointment with them next week.    will schedule follow-up in 3 to 4 months time, sooner as needed. Continue to follow-up with the other providers noted above important and noted that to her today She is aware that her follow-up visit will be with a new provider as I will be leaving this practice prior to that planned follow-up        Towanda Malkin,  MD 09/26/20 11:09 AM

## 2020-09-22 NOTE — Patient Instructions (Signed)
Hi Ms. Ozimek, thank you for speaking with me today.  Ms. Takemoto was given information about Medicaid Managed Care team care coordination services as a part of their Healthy [facility] Medicaid benefit. Evoleth Nordmeyer verbally consented to engagement with the [facility] Managed Care team.   For questions related to your Healthy [facility] health plan, please call: [phone] or visit the homepage here: [email or web address]  If you would like to schedule transportation through your Healthy [facility] plan, please call the following number at least 2 days in advance of your appointment: [phone]  Goals Addressed            This Visit's Progress    COMPLETED: "I need help finding a mental health counselor"       Current Barriers:   History of bipolar disorder, PTSD, history of borderline personality disorder, chronic pain from fibromyalgia and trigeminal neuralgia, history of liver transplant, B-cell lymphoma, SLE, diabetes melitis, hypertension, OSA, migraine headaches  Lacks knowledge of community resource: related to local mental health providers that accept her insurance  Suicidal Ideation/Homicidal Ideation: No  Clinical Social Work Goal(s):   Over the next 90 days, patient will follow up with the [facility] for ongoing mental health treatment* as directed by SW  Update 09/22/20:  patient continues to follow up at [facility] and has an appointment next week.  Interventions:  Patient interviewed and appropriate assessments performed  Confirmed that appointment with  the [facility] has been made for 08/20/19  Confirmed patient's satisfaction with appointment made with the [facility] stating "I am glad that I have a safety net now"  Reinforced need and benefit to keep appointment with the [facility] to set up an intake appointment  Encouraged continued to follow up with Psychiatrist ,Dr. Shea Evans for  medication management  Confirmed that adoptive sister will be moving closer to her which will reduce feelings of isolation  Continued to reinforce the use of self care strategies when she can(swimming, show pony collection, sewing, journaling)  Patient Self Care Activities:   Performs ADL's independently  Performs IADL's independently  Patient Coping Strengths:   Supportive Relationships  Able to Communicate Effectively  Patient Self Care Deficits:   Knowledge deficit regarding local mental health providers that accept her insurance  Please see past updates related to this goal by clicking on the "Past Updates" button in the selected goal        Patient Care Plan: Chronic Pain (Adult)    Problem Identified: Chronic Pain Management-fibromyalgia     Long-Range Goal: Fibromyalgia pain managed-new pain management provider   Start Date: 08/14/2020  Expected End Date: 09/28/2020  Recent Progress: On track  Priority: High  Note:   Current Barriers:   Care Coordination needs related to pain management provider.   Patient is experiencing fibromyalgia flair and is interested in help being connected to a pain management provider.  Unable to independently secure referral or appointment for pain management provider.  Nurse Case Manager Clinical Goal(s):   Over the next 45 days, patient will work with [facility] to address needs related to referral for pain management provider and associated care coordination needs.  Update 09/22/20:  patient is currently seeing Dr. Humphrey Rolls at [facility].  Interventions:   Inter-disciplinary care team collaboration (see longitudinal plan of care)  Evaluation of current treatment plan related to fibromyalgia  and patient's adherence to plan as established by provider.  Collaborated with primary care provider regarding recommendations and referral to pain  management provider.  Discussed plans with patient for ongoing care  management follow up and provided patient with direct contact information for care management team  Anticipate pain education program, pain management support as part of pain management referral.                 Patient Goals/Self-Care Activities Over the next 45 days, patient will:  -Attends all scheduled provider appointments Anticipate communication from PCP or pain management specialist office for new patient appointment.  develop a personal pain management plan with your pain management provider when appointment arranged.   Follow Up Plan: RN Care Manager will collaborate with PCP regarding pain management referral. The Managed Medicaid care management team will reach out to the patient again over the next 30 days.              Patient Care Plan: Wellness (Adult)    Problem Identified: Medication Adherence (Wellness)     Goal: Medication Adherence Maintained   Start Date: 09/06/2020  Expected End Date: 12/05/2020  This Visit's Progress: Not on track  Priority: High  Note:   Current Barriers:   Patient states she needs to have prescriptions refilled and feels she has some side effects from her medications.  Nurse Case Manager Clinical Goal(s):   Over the next 30 days, patient will work with CM team pharmacist to review current medications. Update 09/22/20:  Pharmacy review 09/11/20. Interventions:   Inter-disciplinary care team collaboration (see longitudinal plan of care)  Evaluation of current treatment plan and patient's adherence to plan as established by provider.  Advised patient to contact her PCP for any medication needs.  Reviewed medications with patient.  Collaborated with pharmacy regarding medications.   Discussed plans with patient for ongoing care management follow up and provided patient with direct contact information for care management team  Pharmacy referral for medication review  Patient Goals/Self-Care Activities Over the next 30 days,  patient will:  -Patient will take medications as prescribed. RNCM will follow up with patient within 30 days and make referral to pharmacy. Calls pharmacy for medication refills Calls provider office for new concerns or questions  Follow Up Plan: The Managed Medicaid care management team will reach out to the patient again over the next 30 days.  The patient has been provided with contact information for the Managed Medicaid care management team and has been advised to call with any health related questions or concerns.         Patient verbalizes understanding of instructions provided today.   The Managed Medicaid care management team will reach out to the patient again over the next 30 days.  The patient has been provided with contact information for the Managed Medicaid care management team and has been advised to call with any health related questions or concerns.   [name], [name] [place]   [facility] [employer] - Managed Medicaid High Risk [phone].

## 2020-09-22 NOTE — Patient Outreach (Signed)
Care Coordination - Case Manager  09/22/2020  Alison [name] 1975/11/09 [id number]  Subjective:  Alison [name] is an 44 y.o. year old female who is a primary patient of Towanda Malkin, MD.  Alison [name] was given information about Medicaid Managed Care team care coordination services today. Chrislynn Mosely agreed to services and verbal consent obtained  Review of patient status, laboratory and other test data was performed as part of evaluation for provision of services.  SDOH: SDOH Screenings   Alcohol Screen: Low Risk   . Last Alcohol Screening Score (AUDIT): 0  Depression (PHQ2-9): Medium Risk  . PHQ-2 Score: 7  Financial Resource Strain: Not on file  Food Insecurity: Not on file  Housing: Not on file  Physical Activity: Not on file  Social Connections: Not on file  Stress: Not on file  Tobacco Use: Low Risk   . Smoking Tobacco Use: Never Smoker  . Smokeless Tobacco Use: Never Used  Transportation Needs: Not on file     Objective:    Allergies  Allergen Reactions  . Penicillin G Itching, Rash, Anaphylaxis and Palpitations  . Carbamazepine Other (See Comments)    Medication interaction-prograf  . Hydrocodone-Acetaminophen Itching  . Naproxen Itching    Medications:    Medications Reviewed Today    Reviewed by [name], RN (Registered Nurse) on 09/22/20 at 39  Med List Status: <None>  Medication Order Taking? Sig Documenting Provider Last Dose Status Informant  baclofen (LIORESAL) 20 MG tablet [id number] No Take 1 tablet (20 mg total) by mouth 3 (three) times daily as needed for muscle spasms. [name], FNP Taking Active   benztropine (COGENTIN) 0.5 MG tablet [id number] No Take 1 tablet (0.5 mg total) by mouth daily as needed for tremors. [name], MD Taking Active            Med Note [name] Sep 11, 2020 10:50 AM) PRN  colchicine 0.6 MG tablet [id number] No Take 0.6 mg by mouth 2 (two) times daily. Takes once  daily [provider] Taking Active            Med Note [name] Sep 11, 2020 10:50 AM) PRN  Cranberry 500 MG TABS [id number] No Take by mouth.   Patient not taking: Reported on 09/12/2020   [provider] Not Taking Active   DULoxetine (CYMBALTA) 30 MG capsule [id number] No Take 1 capsule (30 mg total) by mouth daily. To be combined with 60 mg daily [name], MD Taking Active            Med Note [name] Sep 11, 2020 10:47 AM) AM  DULoxetine (CYMBALTA) 60 MG capsule [id number] No Take 1 capsule (60 mg total) by mouth daily. To be combined with 30 mg daily [name], MD Taking Active            Med Note [name] Sep 11, 2020 10:47 AM) PM  glucose blood (PRECISION QID TEST) test strip [id number] No Use once daily Use as instructed. [provider] Taking Active   hydrOXYzine (VISTARIL) 25 MG capsule [id number] No Take 1 capsule (25 mg total) by mouth 2 (two) times daily as needed. For sever anxiety attacks [name], MD Taking Active            Med Note [name] Sep 11, 2020 10:50 AM) PRN  Lactobacillus (PROBIOTIC ACIDOPHILUS  PO) [id number] No Take by mouth. [provider] Taking Active   lamoTRIgine (LAMICTAL) 25 MG tablet [id number] No Take 1 tablet (25 mg total) by mouth daily. [name], MD Taking Active            Med Note [name] Sep 11, 2020 10:47 AM) AM  Lidocaine 0.5 % AERO [id number] No Apply topically. [provider] Taking Active   liraglutide (VICTOZA) 18 MG/3ML SOPN [id number] No Inject into the skin. [provider] Taking Active   lisinopril (ZESTRIL) 20 MG tablet [id number] No Take 1 tablet (20 mg total) by mouth daily. Towanda Malkin, MD Taking Active            Med Note [name] Sep 11, 2020 10:47 AM) AM  Magnesium Bisglycinate (MAG GLYCINATE) 100 MG TABS [id number] No Take by mouth.  [provider] Taking Active   magnesium oxide (MAG-OX) 400 MG tablet [id number] No Take by mouth. [provider] Taking Active   Melatonin 5 MG CAPS [id number] No Take 1-2 capsules (5-10 mg total) by mouth at bedtime. [name], MD Taking Active   metFORMIN (GLUCOPHAGE) 1000 MG tablet [id number] No Take 1 tablet by mouth 2 (two) times a day. [provider] Taking Active   pregabalin (LYRICA) 200 MG capsule [id number] No Take 1 capsule (200 mg total) by mouth 3 (three) times daily. [name], MD Taking Expired 06/06/20 2359            Med Note (Katisha Shimizu, [name]   Wed Sep 06, 2020  2:51 PM) Taking  promethazine (PHENERGAN) 25 MG tablet [id number] No Take 1 tablet by mouth as needed.  [provider] Taking Active   QUEtiapine (SEROQUEL XR) 300 MG 24 hr tablet [id number] No Take 1 tablet (300 mg total) by mouth at bedtime. [name], MD Taking Active   Specialty Vitamins Products (MAGNESIUM, AMINO ACID CHELATE,) 133 MG tablet [id number] No Take 1 tablet by mouth 2 (two) times daily. [provider] Taking Active   SUMAtriptan (IMITREX) 100 MG tablet [id number] No TAKE 1 TABLET BY MOUTH ONCE AS NEEDED FOR MIGRAINE. MAY TAKE A SECOND DOSE AFTER 2 HOURS IF NEEDED [provider] Taking Active   tacrolimus (PROGRAF) 1 MG capsule [id number] No Take 1 capsule by mouth once daily [name], FNP Taking Active            Med Note [name] Sep 11, 2020 10:48 AM) AM  vitamin B-12 (CYANOCOBALAMIN) 1000 MCG tablet [id number] No Take 1,000 mcg by mouth daily. Takes 5000 mcg [provider] Taking Active   XTAMPZA ER 9 MG C12A [id number] No Take 1 capsule by mouth 2 (two) times daily. [provider] Taking Active           Assessment:   Goals Addressed            This Visit's Progress   . COMPLETED: "I need help finding a mental health counselor"       Current Barriers:  . History of bipolar disorder, PTSD, history of  borderline personality disorder, chronic pain from fibromyalgia and trigeminal neuralgia, history of liver transplant, B-cell lymphoma, SLE, diabetes melitis, hypertension, OSA, migraine headaches . Lacks knowledge of community resource: related to local mental health providers that accept her insurance . Suicidal Ideation/Homicidal Ideation: No  Clinical Social Work Goal(s):  [name] Over the next 90 days, patient will follow  up with the [facility] for ongoing mental health treatment* as directed by SW . Update 09/22/20:  patient continues to follow up at [facility] and has an appointment next week.  Interventions: . Patient interviewed and appropriate assessments performed . Confirmed that appointment with  the [facility] has been made for 08/20/19 . Confirmed patient's satisfaction with appointment made with the [facility] stating "I am glad that I have a safety net now" . Reinforced need and benefit to keep appointment with the [facility] to set up an intake appointment . Encouraged continued to follow up with Psychiatrist ,Dr. Shea Evans for medication management . Confirmed that adoptive sister will be moving closer to her which will reduce feelings of isolation . Continued to reinforce the use of self care strategies when she can(swimming, show pony collection, sewing, journaling)  Patient Self Care Activities:  . Performs ADL's independently . Performs IADL's independently  Patient Coping Strengths:  . Supportive Relationships . Able to Communicate Effectively  Patient Self Care Deficits:  [name] Knowledge deficit regarding local mental health providers that accept her insurance  Please see past updates related to this goal by clicking on the "Past Updates" button in the selected goal        Patient Care Plan: Chronic Pain (Adult)    Problem Identified: Chronic Pain Management-fibromyalgia     Long-Range Goal: Fibromyalgia pain managed-new pain management  provider   Start Date: 08/14/2020  Expected End Date: 09/28/2020  Recent Progress: On track  Priority: High  Note:   Current Barriers:  . Care Coordination needs related to pain management provider.   Patient is experiencing fibromyalgia flair and is interested in help being connected to a pain management provider. . Unable to independently secure referral or appointment for pain management provider.  Nurse Case Manager Clinical Goal(s):  [name], patient will work with [facility] to address needs related to referral for pain management provider and associated care coordination needs. [name] Update 09/22/20:  patient is currently seeing Dr. Humphrey Rolls at [facility].  Interventions:  . Inter-disciplinary care team collaboration (see longitudinal plan of care) . Evaluation of current treatment plan related to fibromyalgia  and patient's adherence to plan as established by provider. [name] with primary care provider regarding recommendations and referral to pain management provider. . Discussed plans with patient for ongoing care management follow up and provided patient with direct contact information for care management team . Anticipate pain education program, pain management support as part of pain management referral.                 Patient Goals/Self-Care Activities Over the next 45 days, patient will:  -Attends all scheduled provider appointments Anticipate communication from PCP or pain management specialist office for new patient appointment.  develop a personal pain management plan with your pain management provider when appointment arranged.   Follow Up Plan: RN Care Manager will collaborate with PCP regarding pain management referral. The Managed Medicaid care management team will reach out to the patient again over the next 30 days.              Patient Care Plan: Wellness (Adult)    Problem Identified: Medication Adherence (Wellness)      Goal: Medication Adherence Maintained   Start Date: 09/06/2020  Expected End Date: 12/05/2020  This Visit's Progress: Not on track  Priority: High  Note:   Current Barriers:  . Patient states she needs to  have prescriptions refilled and feels she has some side effects from her medications.  Nurse Case Manager Clinical Goal(s):  [name] Over the next 30 days, patient will work with CM team pharmacist to review current medications. Update 09/22/20:  Pharmacy review 09/11/20.  Interventions:  . Inter-disciplinary care team collaboration (see longitudinal plan of care) . Evaluation of current treatment plan and patient's adherence to plan as established by provider. . Advised patient to contact her PCP for any medication needs. . Reviewed medications with patient. [name] with pharmacy regarding medications.  . Discussed plans with patient for ongoing care management follow up and provided patient with direct contact information for care management team . Pharmacy referral for medication review  Patient Goals/Self-Care Activities Over the next 30 days, patient will:  -Patient will take medications as prescribed. RNCM will follow up with patient within 30 days and make referral to pharmacy. Calls pharmacy for medication refills Calls provider office for new concerns or questions  Follow Up Plan: The Managed Medicaid care management team will reach out to the patient again over the next 30 days.  The patient has been provided with contact information for the Managed Medicaid care management team and has been advised to call with any health related questions or concerns.        Plan: RNCM will follow up with patient within 30 days. Follow-up:  Patient agrees to Care Plan and Follow-up.

## 2020-09-26 ENCOUNTER — Encounter: Payer: Self-pay | Admitting: Internal Medicine

## 2020-09-26 ENCOUNTER — Ambulatory Visit (INDEPENDENT_AMBULATORY_CARE_PROVIDER_SITE_OTHER): Payer: Medicaid Other | Admitting: Internal Medicine

## 2020-09-26 ENCOUNTER — Other Ambulatory Visit: Payer: Self-pay

## 2020-09-26 VITALS — BP 100/64 | HR 106 | Temp 97.8°F | Resp 16 | Ht 66.0 in | Wt 266.6 lb

## 2020-09-26 DIAGNOSIS — I10 Essential (primary) hypertension: Secondary | ICD-10-CM | POA: Diagnosis not present

## 2020-09-26 DIAGNOSIS — G4733 Obstructive sleep apnea (adult) (pediatric): Secondary | ICD-10-CM | POA: Diagnosis not present

## 2020-09-26 DIAGNOSIS — Z944 Liver transplant status: Secondary | ICD-10-CM

## 2020-09-26 DIAGNOSIS — N2581 Secondary hyperparathyroidism of renal origin: Secondary | ICD-10-CM

## 2020-09-26 DIAGNOSIS — E1165 Type 2 diabetes mellitus with hyperglycemia: Secondary | ICD-10-CM

## 2020-09-26 DIAGNOSIS — E669 Obesity, unspecified: Secondary | ICD-10-CM | POA: Diagnosis not present

## 2020-09-26 DIAGNOSIS — G500 Trigeminal neuralgia: Secondary | ICD-10-CM

## 2020-09-26 DIAGNOSIS — E348 Other specified endocrine disorders: Secondary | ICD-10-CM

## 2020-09-26 DIAGNOSIS — C884 Extranodal marginal zone B-cell lymphoma of mucosa-associated lymphoid tissue [MALT-lymphoma]: Secondary | ICD-10-CM | POA: Diagnosis not present

## 2020-09-26 DIAGNOSIS — F3130 Bipolar disorder, current episode depressed, mild or moderate severity, unspecified: Secondary | ICD-10-CM | POA: Diagnosis not present

## 2020-09-26 DIAGNOSIS — R600 Localized edema: Secondary | ICD-10-CM

## 2020-09-26 DIAGNOSIS — N3281 Overactive bladder: Secondary | ICD-10-CM

## 2020-09-26 DIAGNOSIS — M109 Gout, unspecified: Secondary | ICD-10-CM | POA: Diagnosis not present

## 2020-09-27 ENCOUNTER — Other Ambulatory Visit: Payer: Self-pay

## 2020-09-27 ENCOUNTER — Telehealth (INDEPENDENT_AMBULATORY_CARE_PROVIDER_SITE_OTHER): Payer: Medicaid Other | Admitting: Psychiatry

## 2020-09-27 ENCOUNTER — Encounter: Payer: Self-pay | Admitting: Psychiatry

## 2020-09-27 DIAGNOSIS — G2111 Neuroleptic induced parkinsonism: Secondary | ICD-10-CM | POA: Diagnosis not present

## 2020-09-27 DIAGNOSIS — F3131 Bipolar disorder, current episode depressed, mild: Secondary | ICD-10-CM

## 2020-09-27 DIAGNOSIS — F4310 Post-traumatic stress disorder, unspecified: Secondary | ICD-10-CM | POA: Diagnosis not present

## 2020-09-27 DIAGNOSIS — F3176 Bipolar disorder, in full remission, most recent episode depressed: Secondary | ICD-10-CM | POA: Insufficient documentation

## 2020-09-27 MED ORDER — LAMOTRIGINE 25 MG PO TABS: 25.0000 mg | ORAL_TABLET | Freq: Two times a day (BID) | ORAL | 0 refills | Status: DC

## 2020-09-27 MED ORDER — QUETIAPINE FUMARATE ER 300 MG PO TB24: 300.0000 mg | ORAL_TABLET | Freq: Every day | ORAL | 0 refills | Status: DC

## 2020-09-27 NOTE — Progress Notes (Signed)
Virtual Visit via Video Note  I connected with Alison [name] on 09/27/20 at  4:00 PM EST by a video enabled telemedicine application and verified that I am speaking with the correct person using two identifiers.  Location Provider Location : [facility] Patient Location : Car  Participants: Patient , Provider    I discussed the limitations of evaluation and management by telemedicine and the availability of in person appointments. The patient expressed understanding and agreed to proceed.   I discussed the assessment and treatment plan with the patient. The patient was provided an opportunity to ask questions and all were answered. The patient agreed with the plan and demonstrated an understanding of the instructions.   The patient was advised to call back or seek an in-person evaluation if the symptoms worsen or if the condition fails to improve as anticipated.  [facility] MD OP Progress Note  09/27/2020 6:26 PM Aniko Finnigan  MRN:  [id number]  Chief Complaint:  Chief Complaint    Follow-up     HPI: Alison [name] is a 44 year old Caucasian female, lives in [place], [place] a history of bipolar disorder, PTSD, multiple medical problems including fibromyalgia was evaluated by telemedicine today.  Patient today reports that the holidays are making her sad.  She reports she lost her mother during the holiday season 18 years ago.  She reports the holidays hence brings back a lot of flashbacks about that time that her mother was admitted to the hospital and later on died.  Patient reports sadness, crying spells, since the past few days.  She reports sleep as good majority of time.  She is compliant on all her medications and denies side effects.  She is planning to spend her Christmas with her fianc and his brother.  Patient denies any suicidality, homicidality or perceptual disturbances.  Patient denies any other concerns today.  Visit Diagnosis:    ICD-10-CM   1. Bipolar 1  disorder, depressed, mild (HCC)  F31.31 lamoTRIgine (LAMICTAL) 25 MG tablet   mild  2. PTSD (post-traumatic stress disorder)  F43.10 QUEtiapine (SEROQUEL XR) 300 MG 24 hr tablet  3. 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  Past Medical History:  Past Medical History:  Diagnosis Date  . Abnormal uterine bleeding   . Allergy   . Anxiety   . Arthritis   . Bipolar disorder (manic depression) ([place])   . Chronic kidney failure   . Chronic renal disease, stage III ([place])   . Diabetes mellitus without complication ([place])   . 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  . FH: trigeminal neuralgia   . GERD (gastroesophageal reflux disease)   . Heart murmur   . Hypertension   . Kidney mass   . Lupus ([place])   . Lupus ([place])   . Major depressive disorder   . 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 Migraine   . Morbid obesity ([place])   . Neuromuscular disorder (HCC)    neuropathy  . Neuropathy   . Personality disorder ([place])   . Post herpetic neuralgia   . PTSD (post-traumatic stress disorder)   . Renal disorder   . S/P liver transplant [facility])     Past Surgical History:  Procedure Laterality Date  . BONE MARROW BIOPSY  01/13/2015  . BREAST BIOPSY  12/2014  .  BREAST BIOPSY  2011  . BREAST SURGERY    . CHOLECYSTECTOMY    . HERNIA REPAIR    . infusaport    . LIVER TRANSPLANT  12/17/1991  . LUMBAR PUNCTURE    . PORT A CATH INJECTION ([facility])    . tumor removal  2015    Family Psychiatric History: I have reviewed family psychiatric history from my progress note on 06/10/2019.  Family History:  Family History  Problem Relation Age of Onset  . Heart disease Mother   . Hypertension Mother   . Cancer - Other Mother   . Bipolar disorder Mother   . Heart disease Father   . Hypertension Father   .  Diabetes Father   . Parkinson's disease Maternal Grandmother   . Cancer Maternal Aunt   . Cancer Maternal Uncle   . Cancer Maternal Grandfather   . Lupus Paternal Grandmother   . Hypertension Brother     Social History: I have reviewed social history from my progress note on 06/10/2019. Social History   Socioeconomic History  . Marital status: Single    Spouse name: Not on file  . Number of children: 0  . Years of education: 9  . Highest education level: GED or equivalent  Occupational History  . Occupation: disabled  Tobacco Use  . Smoking status: Never Smoker  . Smokeless tobacco: Never Used  Vaping Use  . Vaping Use: Never used  Substance and Sexual Activity  . Alcohol use: Not Currently  . Drug use: Not Currently    Types: Marijuana    Comment: pain managment last used in early april  . Sexual activity: Not Currently  Other Topics Concern  . Not on file  Social History Narrative  . Not on file   Social Determinants of Health   Financial Resource Strain: Not on file  Food Insecurity: Not on file  Transportation Needs: Not on file  Physical Activity: Not on file  Stress: Not on file  Social Connections: Not on file    Allergies:  Allergies  Allergen Reactions  . Penicillin G Itching, Rash, Anaphylaxis and Palpitations  . Carbamazepine Other (See Comments)    Medication interaction-prograf  . Hydrocodone-Acetaminophen Itching  . Naproxen Itching    Metabolic Disorder Labs: Lab Results  Component Value Date   HGBA1C 8.9 (H) 06/05/2020   MPG 209 06/05/2020   Lab Results  Component Value Date   PROLACTIN 13.4 07/15/2019   PROLACTIN 21.3 05/24/2019   Lab Results  Component Value Date   CHOL 193 06/05/2020   TRIG 348 (H) 06/05/2020   HDL 36 (L) 06/05/2020   CHOLHDL 5.4 (H) 06/05/2020   LDLCALC 111 (H) 06/05/2020   Lab Results  Component Value Date   TSH 2.650 07/15/2019   TSH 4.650 (H) 05/24/2019    Therapeutic Level Labs: No results found  for: LITHIUM No results found for: VALPROATE No components found for:  CBMZ  Current Medications: Current Outpatient Medications  Medication Sig Dispense Refill  . baclofen (LIORESAL) 20 MG tablet Take 1 tablet (20 mg total) by mouth 3 (three) times daily as needed for muscle spasms. 90 each 2  . benztropine (COGENTIN) 0.5 MG tablet Take 1 tablet (0.5 mg total) by mouth daily as needed for tremors. 30 tablet 1  . colchicine 0.6 MG tablet Take 0.6 mg by mouth 2 (two) times daily. Takes once daily    . Cranberry 500 MG TABS Take by mouth.    . DULoxetine (CYMBALTA)  30 MG capsule Take 1 capsule (30 mg total) by mouth daily. To be combined with 60 mg daily 90 capsule 0  . DULoxetine (CYMBALTA) 60 MG capsule Take 1 capsule (60 mg total) by mouth daily. To be combined with 30 mg daily 90 capsule 0  . glucose blood (PRECISION QID TEST) test strip Use once daily Use as instructed.    . hydrOXYzine (VISTARIL) 25 MG capsule Take 1 capsule (25 mg total) by mouth 2 (two) times daily as needed. For sever anxiety attacks 60 capsule 1  . Lactobacillus (PROBIOTIC ACIDOPHILUS PO) Take by mouth.    . lamoTRIgine (LAMICTAL) 25 MG tablet Take 1 tablet (25 mg total) by mouth 2 (two) times daily. 180 tablet 0  . Lidocaine 0.5 % AERO Apply topically.    . liraglutide (VICTOZA) 18 MG/3ML SOPN Inject into the skin.    [name] lisinopril (ZESTRIL) 20 MG tablet Take 1 tablet (20 mg total) by mouth daily. 90 tablet 3  . Magnesium Bisglycinate (MAG GLYCINATE) 100 MG TABS Take by mouth.     . magnesium oxide (MAG-OX) 400 MG tablet Take by mouth.    . Melatonin 5 MG CAPS Take 1-2 capsules (5-10 mg total) by mouth at bedtime. 100 capsule 0  . metFORMIN (GLUCOPHAGE) 1000 MG tablet Take 1 tablet by mouth 2 (two) times a day.    . oxyCODONE ER 13.5 MG C12A Take 1 capsule by mouth 2 (two) times daily.    . pregabalin (LYRICA) 200 MG capsule Take 1 capsule (200 mg total) by mouth 3 (three) times daily. 90 capsule 2  . promethazine  (PHENERGAN) 25 MG tablet Take 1 tablet by mouth as needed.     [name] QUEtiapine (SEROQUEL XR) 300 MG 24 hr tablet Take 1 tablet (300 mg total) by mouth at bedtime. 90 tablet 0  . Specialty Vitamins Products (MAGNESIUM, AMINO ACID CHELATE,) 133 MG tablet Take 1 tablet by mouth 2 (two) times daily.    . SUMAtriptan (IMITREX) 100 MG tablet TAKE 1 TABLET BY MOUTH ONCE AS NEEDED FOR MIGRAINE. MAY TAKE A SECOND DOSE AFTER 2 HOURS IF NEEDED    . tacrolimus (PROGRAF) 1 MG capsule Take 1 capsule by mouth once daily 30 capsule 0  . vitamin B-12 (CYANOCOBALAMIN) 1000 MCG tablet Take 1,000 mcg by mouth daily. Takes 5000 mcg     No current facility-administered medications for this visit.     Musculoskeletal: Strength & Muscle Tone: UTA Gait & Station: UTA Patient leans: N/A  Psychiatric Specialty Exam: Review of Systems  Psychiatric/Behavioral: Positive for dysphoric mood.  All other systems reviewed and are negative.   Last menstrual period 02/04/2018.There is no height or weight on file to calculate BMI.  General Appearance: Casual  Eye Contact:  Fair  Speech:  Clear and Coherent  Volume:  Normal  Mood:  Depressed  Affect:  Congruent  Thought Process:  Goal Directed and Descriptions of Associations: Intact  Orientation:  Full (Time, Place, and Person)  Thought Content: Logical   Suicidal Thoughts:  No  Homicidal Thoughts:  No  Memory:  Immediate;   Fair Recent;   Fair Remote;   Fair  Judgement:  Fair  Insight:  Fair  Psychomotor Activity:  Normal  Concentration:  Concentration: Fair and Attention Span: Fair  Recall:  [employer] of Knowledge: Fair  Language: Fair  Akathisia:  No  Handed:  Right  AIMS (if indicated): UTA  Assets:  Communication Skills Desire for [place]  ADL's:  Intact  Cognition: WNL  Sleep:  Fair   Screenings: [place] Office Visit from 09/26/2020 in [facility] Video Visit from 09/12/2020 in [facility] Office Visit from 07/25/2020 in [facility] Office Visit from 06/05/2020 in [facility] Nutrition from 09/23/2019 in [place]  PHQ-2 Total Score 6 3 6 6 6   PHQ-9 Total Score 17 7 18 18 19        Assessment and Plan: Jasira Robinson is a 44 year old Caucasian female on disability, has a history of bipolar disorder, PTSD, history of borderline personality disorder, chronic pain, fibromyalgia, trigeminal neuralgia, history of liver transplant, B-cell lymphoma, SLE, diabetes melitis, OSA, migraine headaches was evaluated by telemedicine today.  Patient with psychosocial stressors of multiple health issues, current pandemic is currently struggling with depressive symptoms.  Plan as noted below.  Plan Bipolar disorder-depressed-unstable Increase Lamictal to 25 mg p.o. twice daily Discussed the risk of side effects including Stevens-Johnson syndrome. Seroquel extended release 300 mg p.o. nightly Cymbalta 90 mg p.o. daily  PTSD-stable Cymbalta 90 mg p.o. daily Hydroxyzine 25 mg p.o. 3 times daily as needed for anxiety attacks Melatonin 5 to 10 mg p.o. nightly Continue CBT with her therapist  Neuroleptic induced Parkinson's disease-stable Cogentin 0.5 mg p.o. daily as needed  Follow-up in clinic in 3 to 4 weeks or sooner if needed.  I have spent atleast 20 minutes face to face by video with patient today. More than 50 % of the time was spent for preparing to see the patient ( e.g., review of test, records ), ordering medications and test ,psychoeducation and supportive psychotherapy and care coordination,as well as documenting clinical information in electronic health record. This note was generated in part or whole with voice recognition software. Voice recognition is usually quite accurate but there are transcription errors that can and very often do occur. I apologize for any typographical errors that were  not detected and corrected.        [name], MD 09/28/2020, 9:56 AM

## 2020-10-04 ENCOUNTER — Ambulatory Visit: Payer: Medicaid Other | Admitting: Internal Medicine

## 2020-10-11 DIAGNOSIS — F3181 Bipolar II disorder: Secondary | ICD-10-CM | POA: Diagnosis not present

## 2020-10-11 DIAGNOSIS — F4310 Post-traumatic stress disorder, unspecified: Secondary | ICD-10-CM | POA: Diagnosis not present

## 2020-10-13 ENCOUNTER — Encounter: Payer: Self-pay | Admitting: Internal Medicine

## 2020-10-16 ENCOUNTER — Other Ambulatory Visit: Payer: Self-pay

## 2020-10-16 NOTE — Patient Outreach (Signed)
Medicaid Managed Care Social Work Note  10/16/2020 Name:  Alison [name] MRN:  [id number] DOB:  1975-12-10  Alison [name] is an 45 y.o. year old female who is a primary patient of Alison Malkin, MD.  The Alison [name] was consulted for assistance with:  eye doctor and specialist for Fibromyalgia  Alison [name] was given information about Medicaid Managed CareCoordination services today. Alison [name] agreed to services and verbal consent obtained.  Engaged with patient  for by telephone forfollow up visit in response to referral for case management and/or care coordination services.   Assessments/Interventions:  Review of past medical history, allergies, medications, health status, including review of consultants reports, laboratory and other test data, was performed as part of comprehensive evaluation and provision of chronic care management services.  SDOH: (Social Determinant of Health) assessments and interventions performed:  Alison [name] contacted patient to follow up with eye doctor and specialist. Patient stated she has not contacted either one. She states she thinks she has COVID. Her husband tested positive and she is starting to show symptoms. She does have food and has a family friend that is going to get her an at home COVID test kit.   Advanced Directives Status:  Not addressed in this encounter.  Care Plan                 Allergies  Allergen Reactions  . Penicillin G Itching, Rash, Anaphylaxis and Palpitations  . Carbamazepine Other (See Comments)    Medication interaction-prograf  . Hydrocodone-Acetaminophen Itching  . Naproxen Itching    Medications Reviewed Today    Reviewed by Alison Alert, MD (Physician) on 09/27/20 at [place] List Status: <None>  Medication Order Taking? Sig Documenting Provider Last Dose Status Informant  baclofen (LIORESAL) 20 MG tablet [id number] No Take 1 tablet (20 mg total) by mouth 3 (three) times daily  as needed for muscle spasms. Alison Hartshorn, FNP Taking Active   benztropine (COGENTIN) 0.5 MG tablet [id number] No Take 1 tablet (0.5 mg total) by mouth daily as needed for tremors. Alison Alert, MD Taking Active            Med Note Alison [name] Sep 11, 2020 10:50 AM) PRN  colchicine 0.6 MG tablet [id number] No Take 0.6 mg by mouth 2 (two) times daily. Takes once daily [provider] Taking Active            Med Note Alison [name] Sep 11, 2020 10:50 AM) PRN  Cranberry 500 MG TABS [id number] No Take by mouth. [provider] Taking Active   DULoxetine (CYMBALTA) 30 MG capsule [id number] No Take 1 capsule (30 mg total) by mouth daily. To be combined with 60 mg daily Alison Alert, MD Taking Active            Med Note Alison [name] Sep 11, 2020 10:47 AM) AM  DULoxetine (CYMBALTA) 60 MG capsule [id number] No Take 1 capsule (60 mg total) by mouth daily. To be combined with 30 mg daily Alison Alert, MD Taking Active            Med Note Alison [name] Sep 11, 2020 10:47 AM) PM  glucose blood (PRECISION QID TEST) test strip [id number] No Use once daily Use as instructed. [provider] Taking Active   hydrOXYzine (VISTARIL) 25 MG capsule [id number] No Take 1 capsule (25 mg total) by mouth 2 (two)  times daily as needed. For sever anxiety attacks Alison Alert, MD Taking Active            Med Note Alison [name] Sep 11, 2020 10:50 AM) PRN  Lactobacillus (PROBIOTIC ACIDOPHILUS PO) [id number] No Take by mouth. [provider] Taking Active   lamoTRIgine (LAMICTAL) 25 MG tablet [id number]  Take 1 tablet (25 mg total) by mouth 2 (two) times daily. Alison Alert, MD  Active   Lidocaine 0.5 % AERO [id number] No Apply topically. [provider] Taking Active   liraglutide (VICTOZA) 18 MG/3ML SOPN [id number] No Inject into the skin. [provider] Taking Active   lisinopril (ZESTRIL) 20 MG tablet [id number]  No Take 1 tablet (20 mg total) by mouth daily. Alison Malkin, MD Taking Active            Med Note Alison [name] Sep 11, 2020 10:47 AM) AM  Magnesium Bisglycinate (MAG GLYCINATE) 100 MG TABS [id number] No Take by mouth.  [provider] Taking Active   magnesium oxide (MAG-OX) 400 MG tablet [id number] No Take by mouth. [provider] Taking Active   Melatonin 5 MG CAPS [id number] No Take 1-2 capsules (5-10 mg total) by mouth at bedtime. Alison Alert, MD Taking Active   metFORMIN (GLUCOPHAGE) 1000 MG tablet [id number] No Take 1 tablet by mouth 2 (two) times a day. [provider] Taking Active   oxyCODONE ER 13.5 MG C12A [id number] No Take 1 capsule by mouth 2 (two) times daily. [provider] Taking Active   pregabalin (LYRICA) 200 MG capsule [id number] No Take 1 capsule (200 mg total) by mouth 3 (three) times daily. Alison Santa, MD Taking Expired 06/06/20 2359            Med Note (Alison [name], Alison [place]   Wed Sep 06, 2020  2:51 PM) Taking  promethazine (PHENERGAN) 25 MG tablet [id number] No Take 1 tablet by mouth as needed.  [provider] Taking Active   QUEtiapine (SEROQUEL XR) 300 MG 24 hr tablet [id number]  Take 1 tablet (300 mg total) by mouth at bedtime. Alison Alert, MD  Active   Specialty Vitamins Products (MAGNESIUM, AMINO ACID CHELATE,) 133 MG tablet [id number] No Take 1 tablet by mouth 2 (two) times daily. [provider] Taking Active   SUMAtriptan (IMITREX) 100 MG tablet [id number] No TAKE 1 TABLET BY MOUTH ONCE AS NEEDED FOR MIGRAINE. MAY TAKE A SECOND DOSE AFTER 2 HOURS IF NEEDED [provider] Taking Active   tacrolimus (PROGRAF) 1 MG capsule [id number] No Take 1 capsule by mouth once daily Alison Hartshorn, FNP Taking Active            Med Note Alison [name] Sep 11, 2020 10:48 AM) AM  vitamin B-12 (CYANOCOBALAMIN) 1000 MCG tablet [id number] No Take 1,000 mcg by mouth daily. Takes 5000 mcg [provider] Taking Active           Patient Active Problem List   Diagnosis Date Noted  . Bipolar disorder, in full remission, most recent episode depressed ([place]) 09/27/2020  . Suprapubic pain 07/25/2020  . Bipolar 1 disorder, depressed ([place]) 07/10/2020  . 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 Edema of lower extremity 09/16/2019  . Carpal tunnel syndrome, left 07/26/2019  . Cubital tunnel syndrome on left 07/26/2019  . Bipolar I disorder, most recent episode depressed ([place]) 07/12/2019  . PTSD (post-traumatic stress disorder) 07/12/2019  . Umbilical hernia without  obstruction and without gangrene 06/17/2019  . Encounter for surveillance of abnormal nevi 06/17/2019  . Pineal gland cyst 06/17/2019  . Chronic hip pain, left 06/02/2019  . Lumbar spondylosis 06/02/2019  . Mouth dryness 06/02/2019  . Obesity (BMI 35.0-39.9 without comorbidity) 06/02/2019  . Goals of care, counseling/discussion 05/18/2019  . 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 Occipital neuralgia 05/13/2019  . Plantar fasciitis of left foot 04/27/2019  . Fibromyalgia 03/23/2019  . Systemic lupus erythematosus (SLE) in adult ([place]) 03/23/2019  . Essential hypertension 03/23/2019  . Hepatitis 03/23/2019  . Mass of left kidney 03/23/2019  . Diabetes mellitus ([place]) 03/23/2019  . Nausea without vomiting 03/23/2019  . Neuropathy 03/23/2019  . Cancer ([place]) 03/23/2019  . Hyperlipidemia 03/23/2019  . Osteoarthritis 03/23/2019  . Trigeminal neuralgia 03/23/2019  . Chronic renal disease, stage III ([place]) 03/23/2019  . Nephrolithiasis 03/23/2019  . Migraine 03/23/2019  . OSA on CPAP 03/23/2019  . Fibrocystic breast 03/23/2019  . Heart murmur 03/23/2019  . Bipolar disorder, in partial remission, most recent episode depressed ([place]) 07/02/2017  . Abnormal uterine bleeding 03/18/2017  . Major  depressive disorder, recurrent ([place]) 03/18/2017  . Morbid obesity ([place]) 03/18/2017  . Secondary hyperparathyroidism ([place]) 02/07/2017  . Neuropathic pain 11/19/2016  . Post herpetic neuralgia 11/19/2016  . 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[place]) 01/13/2015  . Lumbar disc disease with radiculopathy 07/26/2013  . S/P liver transplant ([place]) 07/26/2013  . Type 2 diabetes mellitus, uncontrolled ([place]) 07/26/2013  . Facial nerve disorder 06/29/2012  . Low back pain 12/26/2010    Conditions to be addressed/monitored per PCP order:  N/A  Patient Care Plan: Chronic Pain (Adult)    Problem Identified: Chronic Pain Management-fibromyalgia     Long-Range Goal: Fibromyalgia pain managed-new pain management provider   Start Date: 08/14/2020  Expected End Date: 09/28/2020  Recent Progress: On track  Priority: High  Note:   Current Barriers:  . Care Coordination needs related to pain management provider.   Patient is experiencing fibromyalgia flair and is interested in help being connected to a pain management provider. . Unable to independently secure referral or appointment for pain management provider.  Nurse Case Manager Clinical Goal(s):  [name], patient will work with [facility] to address needs related to referral for pain management provider and associated care coordination needs. [name] Update 09/22/20:  patient is currently seeing Dr. Humphrey Rolls at [facility].  Interventions:  . Inter-disciplinary care [name] collaboration (see longitudinal plan of care) . Evaluation of current treatment plan related to fibromyalgia  and patient's adherence to plan as established by provider. [name] with primary care provider regarding recommendations and referral to pain management provider. . Discussed plans with patient for ongoing care management follow up and provided patient with direct contact information for care management [name] . Anticipate pain education  program, pain management support as part of pain management referral.      . Alison [name] contacted patient to check in. Patient stated she has not contacted an eye doctor nor has she seen a specialist. Patient states she thinks she has COVID due to her husband having it. She has not taken a test yet, but has some symptoms. She is isolating in the bedroom and they have someone to cook and bring her food.         Patient Goals/Self-Care Activities Over the next 45 days,  patient will:  -Attends all scheduled provider appointments Anticipate communication from PCP or pain management specialist office for new patient appointment.  develop a personal pain management plan with your pain management provider when appointment arranged.   Follow Up Plan: RN Care Manager will collaborate with PCP regarding pain management referral. The Managed Medicaid care management [name] will reach out to the patient again over the next 30 days.              Patient Care Plan: Wellness (Adult)    Problem Identified: Medication Adherence (Wellness)     Goal: Medication Adherence Maintained   Start Date: 09/06/2020  Expected End Date: 12/05/2020  This Visit's Progress: Not on track  Priority: High  Note:   Current Barriers:  . Patient states she needs to have prescriptions refilled and feels she has some side effects from her medications.  Nurse Case Manager Clinical Goal(s):  [name] Over the next 30 days, patient will work with CM [name] pharmacist to review current medications.  Interventions:  . Inter-disciplinary care [name] collaboration (see longitudinal plan of care) . Evaluation of current treatment plan and patient's adherence to plan as established by provider. . Advised patient to contact her PCP for any medication needs. . Reviewed medications with patient. [name] with pharmacy regarding medications.  . Discussed plans with patient for ongoing care management follow up and provided patient with direct  contact information for care management [name] . Pharmacy referral for medication review  Patient Goals/Self-Care Activities Over the next 30 days, patient will:  -Patient will take medications as prescribed. RNCM will follow up with patient within 30 days and make referral to pharmacy. Calls pharmacy for medication refills Calls provider office for new concerns or questions  Follow Up Plan: The Managed Medicaid care management [name] will reach out to the patient again over the next 30 days.  The patient has been provided with contact information for the Managed Medicaid care management [name] and has been advised to call with any health related questions or concerns.         Follow up:  Patient agrees to Care Plan and Follow-up.  Plan: The Managed Medicaid care management [name] will reach out to the patient again over the next 30 days.  Date/time of next scheduled Social Work care management/care coordination outreach:  11/16/20 at 10:00am  Mickel Fuchs, [place], [place] Medicaid [name]

## 2020-10-16 NOTE — Patient Instructions (Signed)
Visit Information  Alison [name] was given information about Medicaid Managed Care team care coordination services as a part of their Healthy [facility] Medicaid benefit. Alison [name] verbally consented to engagement with the [name] Managed Care team.   For questions related to your Healthy [name] health plan, please call: [phone] or visit the homepage here: [email or web address]  If you would like to schedule transportation through your Healthy [name] plan, please call the following number at least 2 days in advance of your appointment: [phone]  Goals Addressed   None    Patient Care Plan: Chronic Pain (Adult)    Problem Identified: Chronic Pain Management-fibromyalgia     Long-Range Goal: Fibromyalgia pain managed-new pain management provider   Start Date: 08/14/2020  Expected End Date: 09/28/2020  Recent Progress: On track  Priority: High  Note:   Current Barriers:  . Care Coordination needs related to pain management provider.   Patient is experiencing fibromyalgia flair and is interested in help being connected to a pain management provider. . Unable to independently secure referral or appointment for pain management provider.  Nurse Case Manager Clinical Goal(s):  [name], [name] Update 09/22/20:  patient is currently seeing Alison [name] at [place] [facility].  Interventions:  . Inter-disciplinary care team collaboration (see longitudinal plan of care) . Evaluation of current treatment plan related to fibromyalgia  and patient's adherence to plan as established by provider. [name] with primary care provider regarding recommendations and referral to pain management provider. . Discussed plans with patient for ongoing care management follow up and provided patient  with direct contact information for care management team . Anticipate pain education program, pain management support as part of pain management referral.      . [name] contacted patient to check in. Patient stated she has not contacted an eye doctor nor has she seen a specialist. Patient states she thinks she has COVID due to her husband having it. She has not taken a test yet, but has some symptoms. She is isolating in the bedroom and they have someone to cook and bring her food.         Patient Goals/Self-Care Activities Over the next 45 days, patient will:  -Attends all scheduled provider appointments Anticipate communication from PCP or pain management specialist office for new patient appointment.  develop a personal pain management plan with your pain management provider when appointment arranged.   Follow Up Plan: RN Care Manager will collaborate with PCP regarding pain management referral. The Managed Medicaid care management team will reach out to the patient again over the next 30 days.              Patient Care Plan: Wellness (Adult)    Problem Identified: Medication Adherence (Wellness)     Goal: Medication Adherence Maintained   Start Date: 09/06/2020  Expected End Date: 12/05/2020  This Visit's Progress: Not on track  Priority: High  Note:   Current Barriers:  . Patient states she needs to have prescriptions refilled and feels she has some side effects from her medications.  Nurse Case Manager Clinical Goal(s):  [name] Over the next 30 days, patient will work with CM team pharmacist to review current medications.  Interventions:  . Inter-disciplinary care team collaboration (see longitudinal plan of care) . Evaluation of current treatment plan and patient's adherence to plan  as established by provider. . Advised patient to contact her PCP for any medication needs. . Reviewed medications with patient. [name] with pharmacy regarding medications.  . Discussed  plans with patient for ongoing care management follow up and provided patient with direct contact information for care management team . Pharmacy referral for medication review  Patient Goals/Self-Care Activities Over the next 30 days, patient will:  -Patient will take medications as prescribed. RNCM will follow up with patient within 30 days and make referral to pharmacy. Calls pharmacy for medication refills Calls provider office for new concerns or questions  Follow Up Plan: The Managed Medicaid care management team will reach out to the patient again over the next 30 days.  The patient has been provided with contact information for the Managed Medicaid care management team and has been advised to call with any health related questions or concerns.          Social Worker will follow up in 30 days.   Ethelda Chick

## 2020-10-17 ENCOUNTER — Encounter: Payer: Self-pay | Admitting: Internal Medicine

## 2020-10-17 ENCOUNTER — Ambulatory Visit (INDEPENDENT_AMBULATORY_CARE_PROVIDER_SITE_OTHER): Payer: Medicaid Other | Admitting: Internal Medicine

## 2020-10-17 VITALS — HR 83

## 2020-10-17 DIAGNOSIS — E1165 Type 2 diabetes mellitus with hyperglycemia: Secondary | ICD-10-CM | POA: Diagnosis not present

## 2020-10-17 DIAGNOSIS — C884 Extranodal marginal zone B-cell lymphoma of mucosa-associated lymphoid tissue [MALT-lymphoma]: Secondary | ICD-10-CM

## 2020-10-17 DIAGNOSIS — Z209 Contact with and (suspected) exposure to unspecified communicable disease: Secondary | ICD-10-CM | POA: Diagnosis not present

## 2020-10-17 DIAGNOSIS — I10 Essential (primary) hypertension: Secondary | ICD-10-CM | POA: Diagnosis not present

## 2020-10-17 DIAGNOSIS — Z944 Liver transplant status: Secondary | ICD-10-CM

## 2020-10-17 DIAGNOSIS — B349 Viral infection, unspecified: Secondary | ICD-10-CM | POA: Diagnosis not present

## 2020-10-17 NOTE — Progress Notes (Signed)
Name: Alison [name]   MRN: [id number]    DOB: [birth year]   Date:10/17/2020       Progress Note  Subjective  Chief Complaint  Chief Complaint  Patient presents with  . Covid Exposure    Husband and roommate tested positive.    I connected with  Joydan Gretzinger on 10/17/20 at 11:20 AM EST by telephone and verified that I am speaking with the correct person using two identifiers.  I discussed the limitations, risks, security and privacy concerns of performing an evaluation and management service by telephone and the availability of in person appointments. The patient expressed understanding and agreed to proceed. Staff also discussed with the patient that there may be a patient responsible charge related to this service. Patient Location: Home Provider Location: [facility] Additional Individuals present: none  HPI Patient is a 45 year old female Last visit with me was 09/26/2020 Presents today with the above complaint Had an outbreak at husbands job, IT for [employer] vaccination status - had vaccine and a booster Patient with concerning sx's since Friday + cough, mild, no production No marked SOB, "a little bit" when up and about + fever,  Intermittently, not have thermometer to check, feeling feverish with chills and sweats + sore throat.  No mild congestion, No PND No  loss of smell, loss of taste No  N/V + muscle aches No marked loose stools/diarrhea Some CP, noted in bronchial tubes and when takes deep breath, no left sided CP, no passing out episodes Staying isolated from others presently Trying to stay hydrated, taking no medicines  Comorbid conditions reviewed No tob hx No asthma  + h/o DM,  + HTN + CKD,  + morbid obesity + h/o liver transplant, lymphoma post-transplant  Disabled and not working  She noted she lost her Aunt to Covid in 2021.   Patient Active Problem List   Diagnosis Date Noted  . Bipolar disorder, in full remission, most recent episode  depressed ([place]) 09/27/2020  . Suprapubic pain 07/25/2020  . Bipolar 1 disorder, depressed ([place]) 07/10/2020  . 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 Edema of lower extremity 09/16/2019  . Carpal tunnel syndrome, left 07/26/2019  . Cubital tunnel syndrome on left 07/26/2019  . Bipolar I disorder, most recent episode depressed ([place]) 07/12/2019  . PTSD (post-traumatic stress disorder) 07/12/2019  . Umbilical hernia without obstruction and without gangrene 06/17/2019  . Encounter for surveillance of abnormal nevi 06/17/2019  . Pineal gland cyst 06/17/2019  . Chronic hip pain, left 06/02/2019  . Lumbar spondylosis 06/02/2019  . Mouth dryness 06/02/2019  . Obesity (BMI 35.0-39.9 without comorbidity) 06/02/2019  . Goals of care, counseling/discussion 05/18/2019  . 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[place]) 05/18/2019  . Occipital neuralgia 05/13/2019  . Plantar fasciitis of left foot 04/27/2019  . Fibromyalgia 03/23/2019  . Systemic lupus erythematosus (SLE) in adult ([place]) 03/23/2019  . Essential hypertension 03/23/2019  . Hepatitis 03/23/2019  . Mass of left kidney 03/23/2019  . Diabetes mellitus ([place]) 03/23/2019  . Nausea without vomiting 03/23/2019  . Neuropathy 03/23/2019  . Cancer ([place]) 03/23/2019  . Hyperlipidemia 03/23/2019  . Osteoarthritis 03/23/2019  . Trigeminal neuralgia 03/23/2019  . Chronic renal disease, stage III ([place]) 03/23/2019  . Nephrolithiasis 03/23/2019  . Migraine 03/23/2019  . OSA on CPAP 03/23/2019  . Fibrocystic breast 03/23/2019  . Heart murmur 03/23/2019  . Bipolar disorder, in partial remission, most recent episode depressed (  [place]) 07/02/2017  . Abnormal uterine bleeding 03/18/2017  . Major depressive disorder, recurrent ([place]) 03/18/2017  . Morbid obesity ([place]) 03/18/2017  . Secondary hyperparathyroidism ([place]) 02/07/2017  . Neuropathic pain  11/19/2016  . Post herpetic neuralgia 11/19/2016  . 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[place]) 01/13/2015  . Lumbar disc disease with radiculopathy 07/26/2013  . S/P liver transplant ([place]) 07/26/2013  . Type 2 diabetes mellitus, uncontrolled ([place]) 07/26/2013  . Facial nerve disorder 06/29/2012  . Low back pain 12/26/2010    Past Surgical History:  Procedure Laterality Date  . BONE MARROW BIOPSY  01/13/2015  . BREAST BIOPSY  12/2014  . BREAST BIOPSY  2011  . BREAST SURGERY    . CHOLECYSTECTOMY    . HERNIA REPAIR    . infusaport    . LIVER TRANSPLANT  12/17/1991  . LUMBAR PUNCTURE    . PORT A CATH INJECTION ([place] HX)    . tumor removal  2015    Family History  Problem Relation Age of Onset  . Heart disease Mother   . Hypertension Mother   . Cancer - Other Mother   . Bipolar disorder Mother   . Heart disease Father   . Hypertension Father   . Diabetes Father   . Parkinson's disease Maternal Grandmother   . Cancer Maternal Aunt   . Cancer Maternal Uncle   . Cancer Maternal Grandfather   . Lupus Paternal Grandmother   . Hypertension Brother     Social History   Tobacco Use  . Smoking status: Never Smoker  . Smokeless tobacco: Never Used  Substance Use Topics  . Alcohol use: Not Currently     Current Outpatient Medications:  .  baclofen (LIORESAL) 20 MG tablet, Take 1 tablet (20 mg total) by mouth 3 (three) times daily as needed for muscle spasms., Disp: 90 each, Rfl: 2 .  benztropine (COGENTIN) 0.5 MG tablet, Take 1 tablet (0.5 mg total) by mouth daily as needed for tremors., Disp: 30 tablet, Rfl: 1 .  colchicine 0.6 MG tablet, Take 0.6 mg by mouth 2 (two) times daily. Takes once daily, Disp: , Rfl:  .  Cranberry 500 MG TABS, Take by mouth., Disp: , Rfl:  .  DULoxetine (CYMBALTA) 30 MG capsule, Take 1 capsule (30 mg total) by mouth daily. To be combined with 60 mg daily, Disp: 90 capsule, Rfl: 0 .  DULoxetine (CYMBALTA) 60 MG capsule, Take 1 capsule (60 mg total)  by mouth daily. To be combined with 30 mg daily, Disp: 90 capsule, Rfl: 0 .  glucose blood (PRECISION QID TEST) test strip, Use once daily Use as instructed., Disp: , Rfl:  .  hydrOXYzine (VISTARIL) 25 MG capsule, Take 1 capsule (25 mg total) by mouth 2 (two) times daily as needed. For sever anxiety attacks, Disp: 60 capsule, Rfl: 1 .  Lactobacillus (PROBIOTIC ACIDOPHILUS PO), Take by mouth., Disp: , Rfl:  .  lamoTRIgine (LAMICTAL) 25 MG tablet, Take 1 tablet (25 mg total) by mouth 2 (two) times daily., Disp: 180 tablet, Rfl: 0 .  Lidocaine 0.5 % AERO, Apply topically., Disp: , Rfl:  .  liraglutide (VICTOZA) 18 MG/3ML SOPN, Inject into the skin., Disp: , Rfl:  .  lisinopril (ZESTRIL) 20 MG tablet, Take 1 tablet (20 mg total) by mouth daily., Disp: 90 tablet, Rfl: 3 .  Magnesium Bisglycinate (MAG GLYCINATE) 100 MG TABS, Take by mouth. , Disp: , Rfl:  .  magnesium oxide (MAG-OX) 400 MG tablet, Take by mouth., Disp: ,  Rfl:  .  Melatonin 5 MG CAPS, Take 1-2 capsules (5-10 mg total) by mouth at bedtime., Disp: 100 capsule, Rfl: 0 .  metFORMIN (GLUCOPHAGE) 1000 MG tablet, Take 1 tablet by mouth 2 (two) times a day., Disp: , Rfl:  .  oxyCODONE ER 13.5 MG C12A, Take 1 capsule by mouth 2 (two) times daily., Disp: , Rfl:  .  promethazine (PHENERGAN) 25 MG tablet, Take 1 tablet by mouth as needed. , Disp: , Rfl:  .  QUEtiapine (SEROQUEL XR) 300 MG 24 hr tablet, Take 1 tablet (300 mg total) by mouth at bedtime., Disp: 90 tablet, Rfl: 0 .  Specialty Vitamins Products (MAGNESIUM, AMINO ACID CHELATE,) 133 MG tablet, Take 1 tablet by mouth 2 (two) times daily., Disp: , Rfl:  .  SUMAtriptan (IMITREX) 100 MG tablet, TAKE 1 TABLET BY MOUTH ONCE AS NEEDED FOR MIGRAINE. MAY TAKE A SECOND DOSE AFTER 2 HOURS IF NEEDED, Disp: , Rfl:  .  tacrolimus (PROGRAF) 1 MG capsule, Take 1 capsule by mouth once daily, Disp: 30 capsule, Rfl: 0 .  vitamin B-12 (CYANOCOBALAMIN) 1000 MCG tablet, Take 1,000 mcg by mouth daily. Takes 5000  mcg, Disp: , Rfl:  .  pregabalin (LYRICA) 200 MG capsule, Take 1 capsule (200 mg total) by mouth 3 (three) times daily., Disp: 90 capsule, Rfl: 2  Allergies  Allergen Reactions  . Penicillin G Itching, Rash, Anaphylaxis and Palpitations  . Carbamazepine Other (See Comments)    Medication interaction-prograf  . Hydrocodone-Acetaminophen Itching  . Naproxen Itching    With staff assistance, above reviewed with the patient today.  ROS: As per HPI, otherwise no specific complaints on a limited and focused system review   Objective  Virtual encounter, vitals not obtained.  There is no height or weight on file to calculate BMI.  Physical Exam   Appears in NAD via conversation, no coughing episodes during our conversation Pulmonary/Chest: No obvious respiratory distress. Speaking in complete sentences Neurological: Pt is alert, Speech is normal Psychiatric: Patient has a normal mood and affect, behavior is normal. Judgment and thought content normal.   No results found for this or any previous visit (from the past 72 hour(s)).  PHQ2/9: Depression screen [facility] 2/9 10/17/2020 09/26/2020 09/12/2020 07/25/2020 06/05/2020  Decreased Interest _0 Down, Depressed, Hopeless _1 PHQ - 2 Score _2 Altered sleeping 0 3 0 3 3  Tired, decreased energy _3 Change in appetite 2 2 0 2 2  Feeling bad or failure about yourself  0 1 0 2 1  Trouble concentrating _4 Moving slowly or fidgety/restless 0 0 0 0 0  Suicidal thoughts 0 0 0 0 0  PHQ-9 Score _5 Difficult doing work/chores Somewhat difficult Somewhat difficult Somewhat difficult Very difficult Very difficult  Some recent data might be hidden   PHQ-2/9 Result reviewed  Fall Risk: Fall Risk  10/17/2020 09/26/2020 09/12/2020 07/25/2020 06/05/2020  Falls in the past year? _6 0 1  Number falls in past yr: 1 1 0 0 1  Comment - - - - 6  Injury with Fall? _7 0 1  Risk for fall due to : - - - -  -  Follow up - - - - Falls evaluation completed     Assessment & Plan  1. Viral syndrome 2. Exposure to potential infection  Discussed with patient concerns that her COVID test will return positive, and do feel is very likely.  She did have a significant exposure to COVID as noted above.  She has been symptomatic since Friday. Noted the most important thing to do presently is get tested, as if it is positive, with her significant comorbidities, may qualify to be able to get entities available to help in management which include antibodies or 2 pills that are now available.  They are a limited supply, and triaged by the [facility] system, and we place a referral to help get her in the pool for consideration of the treatment pending their availability. Given her comorbidities, I do feel like a referral should be placed if it is a positive COVID test for consideration. Emphasized symptomatic measures as well to help in the short-term including Tylenol as needed for any temperatures, can use a Mucinex product to help if having more cough, and emphasized relative rest and staying well-hydrated. She remains isolated presently at home, and to continue isolated until symptoms significantly improved and pretty much resolved.  Noted she needs to wear a mask if she is out about them for 7 days after improvements, and she noted she does wear a mask when out and about routinely at this time. A COVID test was ordered, and she will present this afternoon to the clinic, and a COVID test will be obtained.  Crystal was helpful in these arrangements. Await that test result presently.  Emphasized if her symptoms are increasing or more problematic, follow-up is needed, and if she gets more acute symptoms such as shortness of breath, chest pains, higher fevers, she should be seen and evaluated more emergently in an urgent care or ER setting and she was understanding of that.   3. S/P liver transplant ([place]) 4.  Essential hypertension 5. Type 2 diabetes mellitus with hyperglycemia, without long-term current use of insulin ([facility]) 6. 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 Morbid obesity ([place])  Noted her comorbidities today that put her at higher risk for severity of a COVID infection.  I discussed the assessment and treatment plan with the patient. The patient was provided an opportunity to ask questions and all were answered. The patient agreed with the plan and demonstrated an understanding of the instructions.  Red flags and when to present for emergency care or RTC including fevers, chest pain, shortness of breath, new/worsening/un-resolving symptoms reviewed with patient at time of visit.   The patient was advised to call back or seek an in-person evaluation if the symptoms worsen or if the condition fails to improve as anticipated.  I provided 20 minutes of non-face-to-face time during this encounter that included discussing at length patient's sx/history, pertinent pmhx, medications, treatment and follow up plan. This time also included the necessary documentation, orders, and chart review.  Towanda Malkin, MD

## 2020-10-18 ENCOUNTER — Encounter: Payer: Self-pay | Admitting: Internal Medicine

## 2020-10-19 ENCOUNTER — Other Ambulatory Visit: Payer: Self-pay | Admitting: Internal Medicine

## 2020-10-19 DIAGNOSIS — U071 COVID-19: Secondary | ICD-10-CM

## 2020-10-19 LAB — NOVEL CORONAVIRUS, NAA: SARS-CoV-2, NAA: DETECTED — AB

## 2020-10-19 LAB — SARS-COV-2, NAA 2 DAY TAT

## 2020-10-19 NOTE — Progress Notes (Signed)
Patient's COVID test returned positive, and a referral was placed for potential treatment options given her comorbidities

## 2020-10-20 ENCOUNTER — Telehealth: Payer: Self-pay | Admitting: Infectious Diseases

## 2020-10-20 ENCOUNTER — Other Ambulatory Visit: Payer: Self-pay | Admitting: Nurse Practitioner

## 2020-10-20 NOTE — Telephone Encounter (Signed)
Called to discuss with patient about COVID-19 symptoms and the use of one of the available treatments for those with mild to moderate Covid symptoms and at a high risk of hospitalization.  Pt appears to qualify for outpatient treatment due to co-morbid conditions and/or a member of an at-risk group in accordance with the FDA Emergency Use Authorization.    Symptom onset: 1/7 per chart review  Vaccinated: yes Booster? yes Immunocompromised? yes Qualifiers: transplant recipient, diabetes, BMI  Unable to reach pt - LVM and sent mychart message.   Per chart review of e-visit I think she is too far out and likely too sick for this therapy. She may very well need evaluation for severe covid symptoms and inpatient care.   Alison [name]

## 2020-10-20 NOTE — Progress Notes (Signed)
Called to Discuss with patient about Covid symptoms and the use of the monoclonal antibody infusion for those with mild to moderate Covid symptoms and at a high risk of hospitalization.     Pt unfortunately does not qualify for this infusion or oral treatment due to >7day symptom window. Advised of when to seek emergency help. Of note her PCP has referred to [facility].    Symptom onset: 10/12/20 Vaccinated: Yes Qualified for Infusion: No   Alda Lea, NP [facility]  [phone]

## 2020-10-24 NOTE — Telephone Encounter (Signed)
The respiratory clinic is currently closed due to not having a provider to work it. Not sure what other protocol we have. Please advise

## 2020-10-26 ENCOUNTER — Telehealth: Payer: Self-pay

## 2020-10-30 ENCOUNTER — Encounter: Payer: Self-pay | Admitting: Psychiatry

## 2020-10-30 ENCOUNTER — Telehealth (INDEPENDENT_AMBULATORY_CARE_PROVIDER_SITE_OTHER): Payer: Medicaid Other | Admitting: Psychiatry

## 2020-10-30 ENCOUNTER — Other Ambulatory Visit: Payer: Self-pay

## 2020-10-30 DIAGNOSIS — F4310 Post-traumatic stress disorder, unspecified: Secondary | ICD-10-CM

## 2020-10-30 DIAGNOSIS — G2111 Neuroleptic induced parkinsonism: Secondary | ICD-10-CM | POA: Diagnosis not present

## 2020-10-30 DIAGNOSIS — F3131 Bipolar disorder, current episode depressed, mild: Secondary | ICD-10-CM | POA: Diagnosis not present

## 2020-10-30 MED ORDER — DULOXETINE HCL 30 MG PO CPEP: 30.0000 mg | ORAL_CAPSULE | Freq: Every day | ORAL | 0 refills | Status: DC

## 2020-10-30 MED ORDER — DULOXETINE HCL 60 MG PO CPEP: 60.0000 mg | ORAL_CAPSULE | Freq: Every day | ORAL | 0 refills | Status: DC

## 2020-10-30 MED ORDER — BENZTROPINE MESYLATE 0.5 MG PO TABS: 0.5000 mg | ORAL_TABLET | Freq: Every day | ORAL | 1 refills | Status: DC | PRN

## 2020-10-30 NOTE — Progress Notes (Signed)
Virtual Visit via Video Note  I connected with Alison [name] on 10/30/20 at  1:20 PM EST by a video enabled telemedicine application and verified that I am speaking with the correct person using two identifiers.  Location Provider Location : [facility] Patient Location : Home  Participants: Patient , Provider   I discussed the limitations of evaluation and management by telemedicine and the availability of in person appointments. The patient expressed understanding and agreed to proceed.    I discussed the assessment and treatment plan with the patient. The patient was provided an opportunity to ask questions and all were answered. The patient agreed with the plan and demonstrated an understanding of the instructions.   The patient was advised to call back or seek an in-person evaluation if the symptoms worsen or if the condition fails to improve as anticipated.   [place] MD OP Progress Note  10/30/2020 4:12 PM Alison [name]  MRN:  [id number]  Chief Complaint:  Chief Complaint    Follow-up     HPI: Alison [name] is a 45 year old Caucasian female, lives in [place], , has a history of bipolar disorder, PTSD, multiple medical problems including fibromyalgia was evaluated by telemedicine today.  Patient today reports she tested positive for COVID-19 on January 10.  She reports she currently struggles with a lot of fatigue, cough, shortness of breath, night sweats and so on.  She reports she is completely vaccinated and that may have helped her from getting sicker.  Patient reports she is likely also withdrawing from her opiate medications since she has been taking a reduced dosage since the past few days.  She however has an appointment scheduled for tomorrow and is hoping to get back on her dosage.  Patient otherwise reports mood wise she is doing okay and is compliant on her medications.  Patient denies any suicidality, homicidality or perceptual disturbances.  Patient denies any  other concerns today. Visit Diagnosis:    ICD-10-CM   1. Bipolar 1 disorder, depressed, mild ([place])  F31.31   2. PTSD (post-traumatic stress disorder)  F43.10 DULoxetine (CYMBALTA) 30 MG capsule    DULoxetine (CYMBALTA) 60 MG capsule  3. 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 Abnormal uterine bleeding   . Allergy   . Anxiety   . Arthritis   . Bipolar disorder (manic depression) ([place])   . Chronic kidney failure   . Chronic renal disease, stage III ([place])   . Diabetes mellitus without complication ([place])   . 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  . FH: trigeminal neuralgia   . GERD (gastroesophageal reflux disease)   . Heart murmur   . Hypertension   . Kidney mass   . Lupus ([place])   . Lupus ([place])   . Major depressive disorder   . 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 Migraine   . Morbid obesity ([place])   . Neuromuscular disorder ([facility])    neuropathy  . Neuropathy   . Personality disorder ([place])   . Post herpetic neuralgia   . PTSD (post-traumatic stress disorder)   . Renal disorder   . S/P liver transplant [facility])     Past Surgical History:  Procedure Laterality Date  . BONE  MARROW BIOPSY  01/13/2015  . BREAST BIOPSY  12/2014  . BREAST BIOPSY  2011  . BREAST SURGERY    . CHOLECYSTECTOMY    . HERNIA REPAIR    . infusaport    . LIVER TRANSPLANT  12/17/1991  . LUMBAR PUNCTURE    . PORT A CATH INJECTION ([place] [facility])    . tumor removal  2015    Family Psychiatric History: I have reviewed family psychiatric history from my progress note on 06/10/2019   Family History:  Family History  Problem Relation Age of Onset  . Heart disease Mother   . Hypertension Mother   . Cancer -  Other Mother   . Bipolar disorder Mother   . Heart disease Father   . Hypertension Father   . Diabetes Father   . Parkinson's disease Maternal Grandmother   . Cancer Maternal Aunt   . Cancer Maternal Uncle   . Cancer Maternal Grandfather   . Lupus Paternal Grandmother   . Hypertension Brother     Social History: Reviewed social history from my progress note on 06/10/2019 Social History   Socioeconomic History  . Marital status: Single    Spouse name: Not on file  . Number of children: 0  . Years of education: 9  . Highest education level: GED or equivalent  Occupational History  . Occupation: disabled  Tobacco Use  . Smoking status: Never Smoker  . Smokeless tobacco: Never Used  Vaping Use  . Vaping Use: Never used  Substance and Sexual Activity  . Alcohol use: Not Currently  . Drug use: Not Currently    Types: Marijuana    Comment: pain managment last used in early april  . Sexual activity: Not Currently  Other Topics Concern  . Not on file  Social History Narrative  . Not on file   Social Determinants of Health   Financial Resource Strain: Not on file  Food Insecurity: Not on file  Transportation Needs: Not on file  Physical Activity: Not on file  Stress: Not on file  Social Connections: Not on file    Allergies:  Allergies  Allergen Reactions  . Penicillin G Itching, Rash, Anaphylaxis and Palpitations  . Carbamazepine Other (See Comments)    Medication interaction-prograf  . Hydrocodone-Acetaminophen Itching  . Naproxen Itching    Metabolic Disorder Labs: Lab Results  Component Value Date   HGBA1C 8.9 (H) 06/05/2020   MPG 209 06/05/2020   Lab Results  Component Value Date   PROLACTIN 13.4 07/15/2019   PROLACTIN 21.3 05/24/2019   Lab Results  Component Value Date   CHOL 193 06/05/2020   TRIG 348 (H) 06/05/2020   HDL 36 (L) 06/05/2020   CHOLHDL 5.4 (H) 06/05/2020   LDLCALC 111 (H) 06/05/2020   Lab Results  Component Value Date   TSH  2.650 07/15/2019   TSH 4.650 (H) 05/24/2019    Therapeutic Level Labs: No results found for: LITHIUM No results found for: VALPROATE No components found for:  CBMZ  Current Medications: Current Outpatient Medications  Medication Sig Dispense Refill  . baclofen (LIORESAL) 20 MG tablet Take 1 tablet (20 mg total) by mouth 3 (three) times daily as needed for muscle spasms. 90 each 2  . benztropine (COGENTIN) 0.5 MG tablet Take 1 tablet (0.5 mg total) by mouth daily as needed for tremors. 30 tablet 1  . colchicine 0.6 MG tablet Take 0.6 mg by mouth 2 (two) times daily. Takes once daily    . Cranberry 500  MG TABS Take by mouth.    . DULoxetine (CYMBALTA) 30 MG capsule Take 1 capsule (30 mg total) by mouth daily. To be combined with 60 mg daily 90 capsule 0  . DULoxetine (CYMBALTA) 60 MG capsule Take 1 capsule (60 mg total) by mouth daily. To be combined with 30 mg daily 90 capsule 0  . glucose blood (PRECISION QID TEST) test strip Use once daily Use as instructed.    . hydrOXYzine (VISTARIL) 25 MG capsule Take 1 capsule (25 mg total) by mouth 2 (two) times daily as needed. For sever anxiety attacks 60 capsule 1  . Lactobacillus (PROBIOTIC ACIDOPHILUS PO) Take by mouth.    . lamoTRIgine (LAMICTAL) 25 MG tablet Take 1 tablet (25 mg total) by mouth 2 (two) times daily. 180 tablet 0  . Lidocaine 0.5 % AERO Apply topically.    . liraglutide (VICTOZA) 18 MG/3ML SOPN Inject into the skin.    [name] lisinopril (ZESTRIL) 20 MG tablet Take 1 tablet (20 mg total) by mouth daily. 90 tablet 3  . Magnesium Bisglycinate (MAG GLYCINATE) 100 MG TABS Take by mouth.     . magnesium oxide (MAG-OX) 400 MG tablet Take by mouth.    . Melatonin 5 MG CAPS Take 1-2 capsules (5-10 mg total) by mouth at bedtime. 100 capsule 0  . metFORMIN (GLUCOPHAGE) 1000 MG tablet Take 1 tablet by mouth 2 (two) times a day.    . oxyCODONE ER 13.5 MG C12A Take 1 capsule by mouth 2 (two) times daily.    . pregabalin (LYRICA) 200 MG capsule  Take 1 capsule (200 mg total) by mouth 3 (three) times daily. 90 capsule 2  . promethazine (PHENERGAN) 25 MG tablet Take 1 tablet by mouth as needed.     [name] QUEtiapine (SEROQUEL XR) 300 MG 24 hr tablet Take 1 tablet (300 mg total) by mouth at bedtime. 90 tablet 0  . Specialty Vitamins Products (MAGNESIUM, AMINO ACID CHELATE,) 133 MG tablet Take 1 tablet by mouth 2 (two) times daily.    . SUMAtriptan (IMITREX) 100 MG tablet TAKE 1 TABLET BY MOUTH ONCE AS NEEDED FOR MIGRAINE. MAY TAKE A SECOND DOSE AFTER 2 HOURS IF NEEDED    . tacrolimus (PROGRAF) 1 MG capsule Take 1 capsule by mouth once daily 30 capsule 0  . vitamin B-12 (CYANOCOBALAMIN) 1000 MCG tablet Take 1,000 mcg by mouth daily. Takes 5000 mcg     No current facility-administered medications for this visit.     Musculoskeletal: Strength & Muscle Tone: UTA Gait & Station: Observed as laying on bed Patient leans: N/A  Psychiatric Specialty Exam: Review of Systems  Constitutional: Positive for fatigue.  HENT: Positive for congestion.   Respiratory: Positive for shortness of breath.   Musculoskeletal: Positive for myalgias.  Psychiatric/Behavioral: Positive for dysphoric mood and sleep disturbance.    Last menstrual period 02/04/2018.There is no height or weight on file to calculate BMI.  General Appearance: Casual  Eye Contact:  Fair  Speech:  Clear and Coherent  Volume:  Normal  Mood:  Dysphoric  Affect:  Congruent  Thought Process:  Goal Directed and Descriptions of Associations: Intact  Orientation:  Full (Time, Place, and Person)  Thought Content: Logical   Suicidal Thoughts:  No  Homicidal Thoughts:  No  Memory:  Immediate;   Fair Recent;   Fair Remote;   Fair  Judgement:  Fair  Insight:  Fair  Psychomotor Activity:  UTA  Concentration:  Concentration: Fair and Attention Span: Fair  Recall:  Alison [name] of Knowledge: Fair  Language: Fair  Akathisia:  No  Handed:  Right  AIMS (if indicated): UTA  Assets:   Communication Skills Desire for Improvement Housing Social Support  ADL's:  Intact  Cognition: WNL  Sleep:  Restless due to nightsweats   Screenings: [employer] [facility] Office Visit from 10/17/2020 in [facility] Office Visit from 09/26/2020 in [facility] Video Visit from 09/12/2020 in [facility] Office Visit from 07[facility]25/2020 in [facility] Office Visit from 06/05/2020 in [facility]  PHQ-2 Total Score 4 6 3 6 6   PHQ-9 Total Score 10 17 7 18 18        Assessment and Plan: Alison [name] is a 45 year old Caucasian female on disability, has a history of bipolar disorder, PTSD, history of borderline personality disorder, chronic pain, fibromyalgia, trigeminal neuralgia, history of liver transplant, B-cell lymphoma, SLE, diabetes melitis, OSA, migraine headaches was evaluated by telemedicine today.  Patient is currently struggling with COVID-19 infection and possibly from opioid withdrawal symptoms.  Discussed the plan as noted below.  Plan Bipolar disorder-depressed-improving Lamictal 25 mg p.o. twice daily Seroquel extended release 300 mg p.o. nightly Cymbalta 90 mg p.o. daily  PTSD-stable Cymbalta 90 mg p.o. daily Hydroxyzine 25 mg p.o. 3 times daily as needed for anxiety attacks Melatonin 5 to 10 mg p.o. nightly Continue CBT with her therapist  Neuroleptic induced Parkinson's disease-stable Cogentin 0.5 mg p.o. daily as needed  Provided education about opiate withdrawal symptoms.  Patient reports she has already gotten in touch with her providers and has upcoming appointment.  Will not make any medication changes today since she is currently struggling with COVID-19 infection.  She will continue to follow-up with her providers for the same.  Follow-up in clinic in 2 to 3 weeks or sooner if needed.  I have spent atleast 10 minutes face to face by video with patient today. More  than 50 % of the time was spent for preparing to see the patient ( e.g., review of test, records ), obtaining and to review and separately obtained history , ordering medications and test ,psychoeducation and supportive psychotherapy and care coordination,as well as documenting clinical information in electronic health record. This note was generated in part or whole with voice recognition software. Voice recognition is usually quite accurate but there are transcription errors that can and very often do occur. I apologize for any typographical errors that were not detected and corrected.          [name], MD 10/31/2020, 8:27 AM

## 2020-11-02 NOTE — Telephone Encounter (Signed)
Please close encounter, it will not let me do it. Thank you

## 2020-11-03 ENCOUNTER — Emergency Department: Payer: Medicaid Other

## 2020-11-03 ENCOUNTER — Emergency Department
Admission: EM | Admit: 2020-11-03 | Discharge: 2020-11-03 | Disposition: A | Discharge status: Home / Self Care | Payer: Medicaid Other | Attending: Emergency Medicine | Admitting: Emergency Medicine

## 2020-11-03 ENCOUNTER — Other Ambulatory Visit: Payer: Self-pay

## 2020-11-03 DIAGNOSIS — U071 COVID-19: Secondary | ICD-10-CM | POA: Insufficient documentation

## 2020-11-03 DIAGNOSIS — M797 Fibromyalgia: Secondary | ICD-10-CM | POA: Diagnosis not present

## 2020-11-03 DIAGNOSIS — Z79899 Other long term (current) drug therapy: Secondary | ICD-10-CM | POA: Insufficient documentation

## 2020-11-03 DIAGNOSIS — E1122 Type 2 diabetes mellitus with diabetic chronic kidney disease: Secondary | ICD-10-CM | POA: Diagnosis not present

## 2020-11-03 DIAGNOSIS — Z7984 Long term (current) use of oral hypoglycemic drugs: Secondary | ICD-10-CM | POA: Insufficient documentation

## 2020-11-03 DIAGNOSIS — Z6841 Body Mass Index (BMI) 40.0 and over, adult: Secondary | ICD-10-CM | POA: Diagnosis not present

## 2020-11-03 DIAGNOSIS — E860 Dehydration: Secondary | ICD-10-CM | POA: Insufficient documentation

## 2020-11-03 DIAGNOSIS — E1165 Type 2 diabetes mellitus with hyperglycemia: Secondary | ICD-10-CM | POA: Diagnosis not present

## 2020-11-03 DIAGNOSIS — N183 Chronic kidney disease, stage 3 unspecified: Secondary | ICD-10-CM | POA: Insufficient documentation

## 2020-11-03 DIAGNOSIS — R0602 Shortness of breath: Secondary | ICD-10-CM | POA: Diagnosis not present

## 2020-11-03 DIAGNOSIS — E1140 Type 2 diabetes mellitus with diabetic neuropathy, unspecified: Secondary | ICD-10-CM | POA: Insufficient documentation

## 2020-11-03 DIAGNOSIS — I129 Hypertensive chronic kidney disease with stage 1 through stage 4 chronic kidney disease, or unspecified chronic kidney disease: Secondary | ICD-10-CM | POA: Diagnosis not present

## 2020-11-03 DIAGNOSIS — G500 Trigeminal neuralgia: Secondary | ICD-10-CM | POA: Diagnosis not present

## 2020-11-03 DIAGNOSIS — R42 Dizziness and giddiness: Secondary | ICD-10-CM | POA: Diagnosis not present

## 2020-11-03 DIAGNOSIS — M5481 Occipital neuralgia: Secondary | ICD-10-CM | POA: Diagnosis not present

## 2020-11-03 LAB — CBC
HCT: 40.6 % (ref 36.0–46.0)
Hemoglobin: 13 g/dL (ref 12.0–15.0)
MCH: 29.5 pg (ref 26.0–34.0)
MCHC: 32 g/dL (ref 30.0–36.0)
MCV: 92.1 fL (ref 80.0–100.0)
Platelets: 123 10*3/uL — ABNORMAL LOW (ref 150–400)
RBC: 4.41 MIL/uL (ref 3.87–5.11)
RDW: 13.6 % (ref 11.5–15.5)
WBC: 5.3 10*3/uL (ref 4.0–10.5)
nRBC: 0 % (ref 0.0–0.2)

## 2020-11-03 LAB — BASIC METABOLIC PANEL
Anion gap: 17 — ABNORMAL HIGH (ref 5–15)
Anion gap: 11 (ref 5–15)
BUN: 35 mg/dL — ABNORMAL HIGH (ref 6–20)
BUN: 34 mg/dL — ABNORMAL HIGH (ref 6–20)
CO2: 21 mmol/L — ABNORMAL LOW (ref 22–32)
CO2: 23 mmol/L (ref 22–32)
Calcium: 9.8 mg/dL (ref 8.9–10.3)
Calcium: 9.4 mg/dL (ref 8.9–10.3)
Chloride: 99 mmol/L (ref 98–111)
Chloride: 101 mmol/L (ref 98–111)
Creatinine, Ser: 1.37 mg/dL — ABNORMAL HIGH (ref 0.44–1.00)
Creatinine, Ser: 1.24 mg/dL — ABNORMAL HIGH (ref 0.44–1.00)
GFR, Estimated: 48 mL/min — ABNORMAL LOW (ref >60)
GFR, Estimated: 54 mL/min — ABNORMAL LOW (ref >60)
Glucose, Bld: 349 mg/dL — ABNORMAL HIGH (ref 70–99)
Glucose, Bld: 197 mg/dL — ABNORMAL HIGH (ref 70–99)
Potassium: 4.2 mmol/L (ref 3.5–5.1)
Potassium: 4.4 mmol/L (ref 3.5–5.1)
Sodium: 137 mmol/L (ref 135–145)
Sodium: 135 mmol/L (ref 135–145)

## 2020-11-03 LAB — CBG MONITORING, ED: Glucose-Capillary: 202 mg/dL — ABNORMAL HIGH (ref 70–99)

## 2020-11-03 MED ORDER — SODIUM CHLORIDE 0.9 % IV BOLUS
1000.0000 mL | Freq: Once | INTRAVENOUS | Status: AC
  Administered 2020-11-03: 1000 mL via INTRAVENOUS

## 2020-11-03 MED ORDER — INSULIN ASPART 100 UNIT/ML ~~LOC~~ SOLN
8.0000 [IU] | Freq: Once | SUBCUTANEOUS | Status: DC
  Filled 2020-11-03: qty 1

## 2020-11-03 MED ORDER — HEPARIN SOD (PORK) LOCK FLUSH 100 UNIT/ML IV SOLN
500.0000 [IU] | Freq: Once | INTRAVENOUS | Status: AC
  Administered 2020-11-03: 500 [IU] via INTRAVENOUS

## 2020-11-03 MED ORDER — SODIUM CHLORIDE 0.9 % IV BOLUS: 1000.0000 mL | Freq: Once | INTRAVENOUS | Status: DC

## 2020-11-03 NOTE — ED Provider Notes (Signed)
[facility] Emergency Department Provider Note  Time seen: 1:14 PM  I have reviewed the triage vital signs and the nursing notes.   HISTORY  Chief Complaint Dizziness   HPI Alison [name] is a 45 y.o. female with multiple medical problems including diabetes, lupus, liver transplant, presents to the emergency department for symptoms of dizziness, shortness of breath. According to the patient she was diagnosed with Covid 10/15/2020, symptoms began 10/13/2020. Patient states ever since she had Covid she has had intermittent shortness of breath and dizziness, but states her symptoms have not gone away which has concerned her recently. Patient states she was doing a lot of work yesterday making her bed, etc. and checked her oxygen level and it was 89% with her home pulse oximeter. Patient states she went to urgent care yesterday and had a 90+ percent O2 reading. Patient is currently reading 94 to 95% on room air during my evaluation. Patient states she has been feeling dizzy. Is drinking water more than normal as well.   Past Medical History:  Diagnosis Date  . Abnormal uterine bleeding   . Allergy   . Anxiety   . Arthritis   . Bipolar disorder (manic depression) ([place])   . Chronic kidney failure   . Chronic renal disease, stage III ([place])   . Diabetes mellitus without complication ([place])   . 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  . FH: trigeminal neuralgia   . GERD (gastroesophageal reflux disease)   . Heart murmur   . Hypertension   . Kidney mass   . Lupus ([place])   . Lupus ([place])   . Major depressive disorder   . 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 Migraine   . Morbid obesity ([place])   . Neuromuscular disorder (HCC)    neuropathy  . Neuropathy   . Personality disorder ([place])   . Post herpetic neuralgia   . PTSD (post-traumatic stress disorder)   . Renal disorder   . S/P liver  transplant [facility])     Patient Active Problem List   Diagnosis Date Noted  . Bipolar disorder, in full remission, most recent episode depressed ([place]) 09/27/2020  . Suprapubic pain 07/25/2020  . Bipolar 1 disorder, depressed ([place]) 07/10/2020  . 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 Edema of lower extremity 09/16/2019  . Carpal tunnel syndrome, left 07/26/2019  . Cubital tunnel syndrome on left 07/26/2019  . Bipolar I disorder, most recent episode depressed ([place]) 07/12/2019  . PTSD (post-traumatic stress disorder) 07/12/2019  . Umbilical hernia without obstruction and without gangrene 06/17/2019  . Encounter for surveillance of abnormal nevi 06/17/2019  . Pineal gland cyst 06/17/2019  . Chronic hip pain, left 06/02/2019  . Lumbar spondylosis 06/02/2019  . Mouth dryness 06/02/2019  . Obesity (BMI 35.0-39.9 without comorbidity) 06/02/2019  . Goals of care, counseling/discussion 05/18/2019  . 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 Occipital neuralgia 05/13/2019  . Plantar fasciitis of left foot 04/27/2019  . Fibromyalgia 03/23/2019  . Systemic lupus erythematosus (SLE) in adult ([place]) 03/23/2019  . Essential hypertension 03/23/2019  . Hepatitis 03/23/2019  . Mass of left kidney 03/23/2019  . Diabetes mellitus ([place]) 03/23/2019  . Nausea without vomiting 03/23/2019  . Neuropathy 03/23/2019  . Cancer ([place]) 03/23/2019  . Hyperlipidemia 03/23/2019  . Osteoarthritis 03/23/2019  .  Trigeminal neuralgia 03/23/2019  . Chronic renal disease, stage III ([place]) 03/23/2019  . Nephrolithiasis 03/23/2019  . Migraine 03/23/2019  . OSA on CPAP 03/23/2019  . Fibrocystic breast 03/23/2019  . Heart murmur 03/23/2019  . Bipolar disorder, in partial remission, most recent episode depressed ([place]) 07/02/2017  . Abnormal uterine bleeding 03/18/2017  . Major depressive disorder,  recurrent ([place]) 03/18/2017  . Morbid obesity ([place]) 03/18/2017  . Secondary hyperparathyroidism ([place]) 02/07/2017  . Neuropathic pain 11/19/2016  . Post herpetic neuralgia 11/19/2016  . 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[place]) 01/13/2015  . Lumbar disc disease with radiculopathy 07/26/2013  . S/P liver transplant ([place]) 07/26/2013  . Type 2 diabetes mellitus, uncontrolled ([place]) 07/26/2013  . Facial nerve disorder 06/29/2012  . Low back pain 12/26/2010    Past Surgical History:  Procedure Laterality Date  . BONE MARROW BIOPSY  01/13/2015  . BREAST BIOPSY  12/2014  . BREAST BIOPSY  2011  . BREAST SURGERY    . CHOLECYSTECTOMY    . HERNIA REPAIR    . infusaport    . LIVER TRANSPLANT  12/17/1991  . LUMBAR PUNCTURE    . PORT A CATH INJECTION ([place] HX)    . tumor removal  2015    Prior to Admission medications   Medication Sig Start Date End Date Taking? Authorizing Provider  baclofen (LIORESAL) 20 MG tablet Take 1 tablet (20 mg total) by mouth 3 (three) times daily as needed for muscle spasms. 09/16/19   [name], FNP  benztropine (COGENTIN) 0.5 MG tablet Take 1 tablet (0.5 mg total) by mouth daily as needed for tremors. 10/30/20   [name], MD  colchicine 0.6 MG tablet Take 0.6 mg by mouth 2 (two) times daily. Takes once daily 04/11/20   [provider]  Cranberry 500 MG TABS Take by mouth.    [provider]  DULoxetine (CYMBALTA) 30 MG capsule Take 1 capsule (30 mg total) by mouth daily. To be combined with 60 mg daily 10/30/20   [name], MD  DULoxetine (CYMBALTA) 60 MG capsule Take 1 capsule (60 mg total) by mouth daily. To be combined with 30 mg daily 10/30/20   [name], MD  glucose blood (PRECISION QID TEST) test strip Use once daily Use as instructed. 08/06/19   [provider]  hydrOXYzine (VISTARIL) 25 MG capsule Take 1 capsule (25 mg total) by mouth 2 (two) times daily as needed. For sever anxiety attacks 03/24/20   [name], MD  Lactobacillus (PROBIOTIC ACIDOPHILUS PO) Take by mouth.    [provider]  lamoTRIgine (LAMICTAL) 25 MG tablet Take 1 tablet (25 mg total) by mouth 2 (two) times daily. 09/27/20   [name], MD  Lidocaine 0.5 % AERO Apply topically.    [provider]  liraglutide (VICTOZA) 18 MG/3ML SOPN Inject into the skin. 01/03/20   [provider]  lisinopril (ZESTRIL) 20 MG tablet Take 1 tablet (20 mg total) by mouth daily. 06/07/20   Towanda Malkin, MD  Magnesium Bisglycinate (MAG GLYCINATE) 100 MG TABS Take by mouth.     [provider]  magnesium oxide (MAG-OX) 400 MG tablet Take by mouth.    [provider]  Melatonin 5 MG CAPS Take 1-2 capsules (5-10 mg total) by mouth at bedtime. 09/10/19   [name], MD  metFORMIN (GLUCOPHAGE) 1000 MG tablet Take 1 tablet by mouth 2 (two) times a day.    [provider]  oxyCODONE ER 13.5 MG  C12A Take 1 capsule by mouth 2 (two) times daily. 07/28/20   [provider]  pregabalin (LYRICA) 200 MG capsule Take 1 capsule (200 mg total) by mouth 3 (three) times daily. 07/05/19 06/06/20  [name], MD  promethazine (PHENERGAN) 25 MG tablet Take 1 tablet by mouth as needed.     [provider]  QUEtiapine (SEROQUEL XR) 300 MG 24 hr tablet Take 1 tablet (300 mg total) by mouth at bedtime. 09/27/20   [name], MD  Specialty Vitamins Products (MAGNESIUM, AMINO ACID CHELATE,) 133 MG tablet Take 1 tablet by mouth 2 (two) times daily.    [provider]  SUMAtriptan (IMITREX) 100 MG tablet TAKE 1 TABLET BY MOUTH ONCE AS NEEDED FOR MIGRAINE. MAY TAKE A SECOND DOSE AFTER 2 HOURS IF NEEDED 05/19/19   [provider]  tacrolimus (PROGRAF) 1 MG capsule Take 1 capsule by mouth once daily 07/08/19   [name], FNP  vitamin B-12 (CYANOCOBALAMIN) 1000 MCG tablet Take 1,000 mcg by mouth daily. Takes 5000 mcg    [provider]    Allergies   Allergen Reactions  . Penicillin G Itching, Rash, Anaphylaxis and Palpitations  . Carbamazepine Other (See Comments)    Medication interaction-prograf  . Hydrocodone-Acetaminophen Itching  . Naproxen Itching    Family History  Problem Relation Age of Onset  . Heart disease Mother   . Hypertension Mother   . Cancer - Other Mother   . Bipolar disorder Mother   . Heart disease Father   . Hypertension Father   . Diabetes Father   . Parkinson's disease Maternal Grandmother   . Cancer Maternal Aunt   . Cancer Maternal Uncle   . Cancer Maternal Grandfather   . Lupus Paternal Grandmother   . Hypertension Brother     Social History Social History   Tobacco Use  . Smoking status: Never Smoker  . Smokeless tobacco: Never Used  Vaping Use  . Vaping Use: Never used  Substance Use Topics  . Alcohol use: Not Currently  . Drug use: Not Currently    Types: Marijuana    Comment: pain managment last used in early april    Review of Systems Constitutional: Negative for fever. Intermittent dizziness Cardiovascular: Negative for chest pain. Respiratory: Intermittent shortness of breath none currently Gastrointestinal: Negative for abdominal pain, vomiting and diarrhea. Genitourinary: Negative for urinary compaints Musculoskeletal: Negative for musculoskeletal complaints Neurological: Negative for headache All other ROS negative  ____________________________________________   PHYSICAL EXAM:  VITAL SIGNS: ED Triage Vitals  Enc Vitals Group     BP 11/03/20 1127 103/64     Pulse Rate 11/03/20 1127 (!) 104     Resp 11/03/20 1127 20     Temp 11/03/20 1127 98.4 F (36.9 C)     Temp Source 11/03/20 1127 Oral     SpO2 11/03/20 1127 93 %     Weight 11/03/20 1128 264 lb 8.8 oz (120 kg)     Height 11/03/20 1128 5' 6"  (1.676 m)     Head Circumference --      Peak Flow --      Pain Score 11/03/20 1127 0     Pain Loc --      Pain Edu? --      Excl. in [place]? --    Constitutional:  Alert and oriented. Well appearing and in no distress. Eyes: Normal exam ENT      Head: Normocephalic and atraumatic.      Mouth/Throat: Mucous  membranes are moist. Cardiovascular: Normal rate, regular rhythm around 100 bpm. Respiratory: Normal respiratory effort without tachypnea nor retractions. Breath sounds are clear  Gastrointestinal: Soft and nontender. No distention.   Musculoskeletal: Nontender with normal range of motion in all extremities.  Neurologic:  Normal speech and language. No gross focal neurologic deficits Skin:  Skin is warm, dry and intact.  Psychiatric: Mood and affect are normal.   ____________________________________________    EKG  EKG viewed and interpreted by myself shows sinus tachycardia 108 bpm with a widened QRS, left axis deviation, no concerning ST changes noted.  ____________________________________________    RADIOLOGY  Chest x-ray is negative.  ____________________________________________   INITIAL IMPRESSION / ASSESSMENT AND PLAN / ED COURSE  Pertinent labs & imaging results that were available during my care of the patient were reviewed by me and considered in my medical decision making (see chart for details).   Patient presents emergency department for intermittent dizziness and shortness of breath of the past several weeks since being diagnosed with Covid 10/15/2020. Patient states her symptoms have not worsened but they have failed to improve. Overall the patient appears well she is somewhat tachycardic around 100 bpm currently satting 94 to 95% on room air. Reassuringly clear chest x-ray. Patient's lab work however does show hyperglycemia, patient states she has not been taking her medication as she should and she has been eating chocolates today as well. Patient's anion gap is 17 indicating dehydration/borderline DKA. We will dose insulin, IV hydrate with 2 L and recheck labs. Highly suspect the patient's intermittent dizziness is likely  related to her hyperglycemia and possibly to extended Covid symptoms. Patient has no chest pain, no pleuritic pain, no leg pain.   Patient had difficult access.  We were able to get port access, however at that time recheck for blood glucose was 202 prior to any insulin administration.  Patient was given 1 L of normal saline and rechecked with a blood glucose of 197 and anion gap of 11.  I believe the patient is safe for discharge home.  I discussed with the patient the importance of significant oral hydration and taking her medications as prescribed.  Patient agreeable to plan of care.  During my repeat evaluation patient continues have a 94 to 95% room air saturation, will occasionally drop to 91% but quickly rebounds to 94 or 95%.  Alison [name] was evaluated in Emergency Department on 11/03/2020 for the symptoms described in the history of present illness. She was evaluated in the context of the global COVID-19 pandemic, which necessitated consideration that the patient might be at risk for infection with the SARS-CoV-2 virus that causes COVID-19. Institutional protocols and algorithms that pertain to the evaluation of patients at risk for COVID-19 are in a state of rapid change based on information released by regulatory bodies including the CDC and federal and state organizations. These policies and algorithms were followed during the patient's care in the ED.  ____________________________________________   FINAL CLINICAL IMPRESSION(S) / ED DIAGNOSES  Dizziness Hyperglycemia Dehydration   Harvest Dark, MD 11/03/20 1806

## 2020-11-03 NOTE — ED Notes (Signed)
See triage note  Presents with low O2 sat   States she was positive for COVID on Jan 9th    But noticed some SOB this am  No fever

## 2020-11-03 NOTE — Progress Notes (Signed)
Inpatient Diabetes Program Recommendations  AACE/ADA: New Consensus Statement on Inpatient Glycemic Control (2015)  Target Ranges:  Prepandial:   less than 140 mg/dL      Peak postprandial:   less than 180 mg/dL (1-2 hours)      Critically ill patients:  140 - 180 mg/dL   Lab Results  Component Value Date   GLUCAP 126 (H) 07/08/2019   HGBA1C 8.9 (H) 06/05/2020    Review of Glycemic Control  Diabetes history: DM2 Outpatient Diabetes medications: Metformin 1 gm bid + ?Victoza 0.6 qd Current orders for Inpatient glycemic control: Novolog 8 units IV x 1  Inpatient Diabetes Program Recommendations:   Patient currently in ED. Consider if patient is admitted: -Levemir 8 units bid (0.15 units/kg x 120 kg = 18 units) -Glycemic control order set with Novolog correction 0-9 units tid + hs 0-5 units Will follow during hospitalization.  Thank you, Nani Gasser. Auri Jahnke, RN, MSN, CDE  Diabetes Coordinator Inpatient Glycemic Control Team Team Pager [phone] (8am-5pm) 11/03/2020 1:38 PM

## 2020-11-03 NOTE — ED Notes (Signed)
EDP Paduchowski notified via secure chat that pt's BG 202. Requested confirmation on if still wanting 8 units of insulin and 2 bags of NS. Awaiting orders.

## 2020-11-03 NOTE — ED Notes (Signed)
Pt's port deaccessed.

## 2020-11-03 NOTE — ED Triage Notes (Signed)
Arrived to ED via POV due to dizziness and "oxygen dropping". States she was feeling dizzy intermittently since yesterday and checked her oxygen with her home monitor. States it was reading 87%. Pt reports mild increased in SOB from baseline;recently had COVID. Seen at Urgent Care for same last night.

## 2020-11-03 NOTE — ED Notes (Signed)
Pt states urination has been normal lately; pt denies that she can provide a sample.

## 2020-11-03 NOTE — ED Notes (Signed)
Verbal from EDP Paduchowski to hold 8 units of insulin.

## 2020-11-09 DIAGNOSIS — F4310 Post-traumatic stress disorder, unspecified: Secondary | ICD-10-CM | POA: Diagnosis not present

## 2020-11-09 DIAGNOSIS — F3181 Bipolar II disorder: Secondary | ICD-10-CM | POA: Diagnosis not present

## 2020-11-13 ENCOUNTER — Ambulatory Visit: Payer: Medicaid Other | Admitting: Internal Medicine

## 2020-11-15 ENCOUNTER — Telehealth: Payer: Self-pay | Admitting: Oncology

## 2020-11-16 ENCOUNTER — Ambulatory Visit: Payer: Medicaid Other | Admitting: Family Medicine

## 2020-11-16 ENCOUNTER — Other Ambulatory Visit: Payer: Self-pay

## 2020-11-16 NOTE — Patient Instructions (Signed)
Visit Information  Alison [name] was given information about Medicaid Managed Care team care coordination services as a part of their Healthy [facility] Medicaid benefit. Alison [name] verbally consented to engagement with the [facility] Managed Care team.   For questions related to your Healthy [facility] health plan, please call: [phone] or visit the homepage here: [email or web address]  If you would like to schedule transportation through your Healthy [facility] plan, please call the following number at least 2 days in advance of your appointment: [phone]  Alison [name] - following are the goals we discussed in your visit today:  Goals Addressed   None      Social Worker will follow up in 30 days.   Alison [name]  Following is a copy of your plan of care:  Patient Care Plan: Chronic Pain (Adult)    Problem Identified: Chronic Pain Management-fibromyalgia     Long-Range Goal: Fibromyalgia pain managed-new pain management provider   Start Date: 08/14/2020  Expected End Date: 09/28/2020  Recent Progress: On track  Priority: High  Note:   Current Barriers:  . Care Coordination needs related to pain management provider.   Patient is experiencing fibromyalgia flair and is interested in help being connected to a pain management provider. . Unable to independently secure referral or appointment for pain management provider.  Nurse Case Manager Clinical Goal(s):  [name], patient will work with [facility] to address needs related to referral for pain management provider and associated care coordination needs. [name] Update 09/22/20:  patient is currently seeing Dr. Humphrey Rolls at [facility].  Interventions:  . Inter-disciplinary care team collaboration (see longitudinal plan of care) . Evaluation of current treatment plan related to fibromyalgia  and patient's adherence to plan as established by provider. Alison [name]  with primary care provider regarding recommendations and referral to pain management provider. . Discussed plans with patient for ongoing care management follow up and provided patient with direct contact information for care management team . Anticipate pain education program, pain management support as part of pain management referral.      . [name] contacted patient to check in. Patient stated she has not contacted an eye doctor nor has she seen a specialist. Patient states she thinks she has COVID due to her husband having it. She has not taken a test yet, but has some symptoms. She is isolating in the bedroom and they have someone to cook and bring her food.       [name] Update 11/16/20: [name] completed phone call with patient, she states she is still having symptoms from [place] and has been in bed all month. Patient states she has not had a chance to contact an eye doctor or specialist due to being sick.  Patient Goals/Self-Care Activities Over the next 45 days, patient will:  -Attends all scheduled provider appointments Anticipate communication from PCP or pain management specialist office for new patient appointment.  develop a personal pain management plan with your pain management provider when appointment arranged.   Follow Up Plan: RN Care Manager will collaborate with PCP regarding pain management referral. The Managed Medicaid care management team will reach out to the patient again over the next 30 days.              Patient Care Plan: Wellness (Adult)    Problem Identified: Medication Adherence (Wellness)     Goal: Medication Adherence Maintained   Start Date: 09/06/2020  Expected End Date: 12/05/2020  This Visit's Progress: Not on track  Priority: High  Note:   Current Barriers:  . Patient states she needs to have prescriptions refilled and feels she has some side effects from her medications.  Nurse Case Manager Clinical Goal(s):  [name] Over the next 30 days, patient will work  with CM team pharmacist to review current medications.  Interventions:  . Inter-disciplinary care team collaboration (see longitudinal plan of care) . Evaluation of current treatment plan and patient's adherence to plan as established by provider. . Advised patient to contact her PCP for any medication needs. . Reviewed medications with patient. Alison [name] with pharmacy regarding medications.  . Discussed plans with patient for ongoing care management follow up and provided patient with direct contact information for care management team . Pharmacy referral for medication review  Patient Goals/Self-Care Activities Over the next 30 days, patient will:  -Patient will take medications as prescribed. RNCM will follow up with patient within 30 days and make referral to pharmacy. Calls pharmacy for medication refills Calls provider office for new concerns or questions  Follow Up Plan: The Managed Medicaid care management team will reach out to the patient again over the next 30 days.  The patient has been provided with contact information for the Managed Medicaid care management team and has been advised to call with any health related questions or concerns.

## 2020-11-16 NOTE — Patient Outreach (Signed)
Medicaid Managed Care Social Work Note  11/16/2020 Name:  Alison [name] MRN:  [id number] DOB:  05/09/1976  Alison [name] is an 45 y.o. year old female who is a primary patient of Alison Malkin, MD (Inactive).  The [facility] Managed Care Coordination team was consulted for assistance with:  eye doctor and specialist  Alison [name] was given information about Medicaid Managed CareCoordination services today. Alison [name] agreed to services and verbal consent obtained.  Engaged with patient  for by telephone forfollow up visit in response to referral for case management and/or care coordination services.   Assessments/Interventions:  Review of past medical history, allergies, medications, health status, including review of consultants reports, laboratory and other test data, was performed as part of comprehensive evaluation and provision of chronic care management services.  SDOH: (Social Determinant of Health) assessments and interventions performed:   [name] completed phone call with patient, she states she is still having symptoms from [place] and has been in bed all month. Patient states she has not had a chance to contact an eye doctor or specialist due to being sick.   Advanced Directives Status:  Not addressed in this encounter.  Care Plan                 Allergies  Allergen Reactions  . Penicillin G Itching, Rash, Anaphylaxis and Palpitations  . Carbamazepine Other (See Comments)    Medication interaction-prograf  . Hydrocodone-Acetaminophen Itching  . Naproxen Itching    Medications Reviewed Today    Reviewed by Alison Harman, RN (Registered Nurse) on 11/03/20 at [place] List Status: <None>  Medication Order Taking? Sig Documenting Provider Last Dose Status Informant  baclofen (LIORESAL) 20 MG tablet [id number]  Take 1 tablet (20 mg total) by mouth 3 (three) times daily as needed for muscle spasms. [name], FNP  Active   benztropine (COGENTIN) 0.5 MG tablet  [id number]  Take 1 tablet (0.5 mg total) by mouth daily as needed for tremors. [name], MD  Active   colchicine 0.6 MG tablet [id number]  Take 0.6 mg by mouth 2 (two) times daily. Takes once daily [provider]  Active            Med Note [name] Sep 11, 2020 10:50 AM) PRN  Cranberry 500 MG TABS [id number]  Take by mouth. [provider]  Active   DULoxetine (CYMBALTA) 30 MG capsule [id number]  Take 1 capsule (30 mg total) by mouth daily. To be combined with 60 mg daily [name], [name], MD  Active   DULoxetine (CYMBALTA) 60 MG capsule [id number]  Take 1 capsule (60 mg total) by mouth daily. To be combined with 30 mg daily [name], [name], MD  Active   glucose blood (PRECISION QID TEST) test strip [id number]  Use once daily Use as instructed. [provider]  Active   hydrOXYzine (VISTARIL) 25 MG capsule [id number]  Take 1 capsule (25 mg total) by mouth 2 (two) times daily as needed. For sever anxiety attacks [name], MD  Active            Med Note [name] Sep 11, 2020 10:50 AM) PRN  Lactobacillus (PROBIOTIC ACIDOPHILUS PO) [id number]  Take by mouth. [provider]  Active   lamoTRIgine (LAMICTAL) 25 MG tablet [id number]  Take 1 tablet (25 mg total) by mouth 2 (two) times daily. [name], MD  Active   Lidocaine 0.5 % AERO [id number]  Apply topically. [provider]  Active   liraglutide (VICTOZA) 18 MG/3ML SOPN [id number]  Inject into the skin. [provider]  Active   lisinopril (ZESTRIL) 20 MG tablet [id number]  Take 1 tablet (20 mg total) by mouth daily. Alison Malkin, MD  Active            Med Note [name] Sep 11, 2020 10:47 AM) AM  Magnesium Bisglycinate (MAG GLYCINATE) 100 MG TABS [id number]  Take by mouth.  [provider]  Active   magnesium oxide (MAG-OX) 400 MG tablet [id number]  Take by mouth. [provider]  Active   Melatonin 5 MG CAPS  [id number]  Take 1-2 capsules (5-10 mg total) by mouth at bedtime. [name], MD  Active   metFORMIN (GLUCOPHAGE) 1000 MG tablet [id number]  Take 1 tablet by mouth 2 (two) times a day. [provider]  Active   oxyCODONE ER 13.5 MG C12A [id number]  Take 1 capsule by mouth 2 (two) times daily. [provider]  Active   pregabalin (LYRICA) 200 MG capsule [id number]  Take 1 capsule (200 mg total) by mouth 3 (three) times daily. [name], MD  Expired 06/06/20 2359            Med Note (CRAFT, [name]   Wed Sep 06, 2020  2:51 PM) Taking  promethazine (PHENERGAN) 25 MG tablet [id number]  Take 1 tablet by mouth as needed.  [provider]  Active   QUEtiapine (SEROQUEL XR) 300 MG 24 hr tablet [id number]  Take 1 tablet (300 mg total) by mouth at bedtime. [name], MD  Active   Specialty Vitamins Products (MAGNESIUM, AMINO ACID CHELATE,) 133 MG tablet [id number]  Take 1 tablet by mouth 2 (two) times daily. [provider]  Active   SUMAtriptan (IMITREX) 100 MG tablet [id number]  TAKE 1 TABLET BY MOUTH ONCE AS NEEDED FOR MIGRAINE. MAY TAKE A SECOND DOSE AFTER 2 HOURS IF NEEDED [provider]  Active   tacrolimus (PROGRAF) 1 MG capsule [id number]  Take 1 capsule by mouth once daily [name], FNP  Active            Med Note [name] Sep 11, 2020 10:48 AM) AM  vitamin B-12 (CYANOCOBALAMIN) 1000 MCG tablet [id number]  Take 1,000 mcg by mouth daily. Takes 5000 mcg [provider]  Active           Patient Active Problem List   Diagnosis Date Noted  . Bipolar disorder, in full remission, most recent episode depressed ([place]) 09/27/2020  . Suprapubic pain 07/25/2020  . Bipolar 1 disorder, depressed ([place]) 07/10/2020  . 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 Edema of lower extremity 09/16/2019  . Carpal tunnel syndrome, left 07/26/2019  . Cubital tunnel syndrome on left 07/26/2019  . Bipolar I disorder, most recent  episode depressed ([place]) 07/12/2019  . PTSD (post-traumatic stress disorder) 07/12/2019  . Umbilical hernia without obstruction and without gangrene 06/17/2019  . Encounter for surveillance of abnormal nevi 06/17/2019  . Pineal gland cyst 06/17/2019  . Chronic hip pain, left 06/02/2019  . Lumbar spondylosis 06/02/2019  . Mouth dryness 06/02/2019  . Obesity (BMI 35.0-39.9 without comorbidity) 06/02/2019  . Goals of care, counseling/discussion 05/18/2019  . 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[place]) 05/18/2019  . Occipital neuralgia 05/13/2019  . Plantar fasciitis of left foot 04/27/2019  .  Fibromyalgia 03/23/2019  . Systemic lupus erythematosus (SLE) in adult ([place]) 03/23/2019  . Essential hypertension 03/23/2019  . Hepatitis 03/23/2019  . Mass of left kidney 03/23/2019  . Diabetes mellitus ([place]) 03/23/2019  . Nausea without vomiting 03/23/2019  . Neuropathy 03/23/2019  . Cancer ([place]) 03/23/2019  . Hyperlipidemia 03/23/2019  . Osteoarthritis 03/23/2019  . Trigeminal neuralgia 03/23/2019  . Chronic renal disease, stage III ([place]) 03/23/2019  . Nephrolithiasis 03/23/2019  . Migraine 03/23/2019  . OSA on CPAP 03/23/2019  . Fibrocystic breast 03/23/2019  . Heart murmur 03/23/2019  . Bipolar disorder, in partial remission, most recent episode depressed ([place]) 07/02/2017  . Abnormal uterine bleeding 03/18/2017  . Major depressive disorder, recurrent ([place]) 03/18/2017  . Morbid obesity ([place]) 03/18/2017  . Secondary hyperparathyroidism ([place]) 02/07/2017  . Neuropathic pain 11/19/2016  . Post herpetic neuralgia 11/19/2016  . 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[place]) 01/13/2015  . Lumbar disc disease with radiculopathy 07/26/2013  . S/P liver transplant ([place]) 07/26/2013  . Type 2 diabetes mellitus, uncontrolled ([place]) 07/26/2013  . Facial nerve disorder 06/29/2012  . Low back pain 12/26/2010     Conditions to be addressed/monitored per PCP order:  patient continues to have COVID symptoms  Care Plan : Chronic Pain (Adult)  Updates made by Ethelda Chick since 11/16/2020 12:00 AM    Problem: Chronic Pain Management-fibromyalgia     Long-Range Goal: Fibromyalgia pain managed-new pain management provider   Start Date: 08/14/2020  Expected End Date: 09/28/2020  Recent Progress: On track  Priority: High  Note:   Current Barriers:  . Care Coordination needs related to pain management provider.   Patient is experiencing fibromyalgia flair and is interested in help being connected to a pain management provider. . Unable to independently secure referral or appointment for pain management provider.  Nurse Case Manager Clinical Goal(s):  [name], patient will work with [facility] to address needs related to referral for pain management provider and associated care coordination needs. [name] Update 09/22/20:  patient is currently seeing Dr. Humphrey Rolls at [facility].  Interventions:  . Inter-disciplinary care team collaboration (see longitudinal plan of care) . Evaluation of current treatment plan related to fibromyalgia  and patient's adherence to plan as established by provider. [name] with primary care provider regarding recommendations and referral to pain management provider. . Discussed plans with patient for ongoing care management follow up and provided patient with direct contact information for care management team . Anticipate pain education program, pain management support as part of pain management referral.      . [name] contacted patient to check in. Patient stated she has not contacted an eye doctor nor has she seen a specialist. Patient states she thinks she has COVID due to her husband having it. She has not taken a test yet, but has some symptoms. She is isolating in the bedroom and they have someone to cook and bring her food.        [name] Update 11/16/20: [name] completed phone call with patient, she states she is still having symptoms from [place] and has been in bed all month. Patient states she has not had a chance to contact an eye doctor or specialist due to being sick.  Patient Goals/Self-Care Activities Over the next 45 days, patient will:  -Attends all scheduled provider appointments Anticipate communication from PCP or pain management specialist office for new patient appointment.  develop a personal pain management plan with your pain  management provider when appointment arranged.   Follow Up Plan: RN Care Manager will collaborate with PCP regarding pain management referral. The Managed Medicaid care management team will reach out to the patient again over the next 30 days.                Follow up:  Patient agrees to Care Plan and Follow-up.  Plan: The Managed Medicaid care management team will reach out to the patient again over the next 30 days.  Date/time of next scheduled Social Work care management/care coordination outreach:  12/14/20  Mickel Fuchs, [place], [place]  High Risk Managed Medicaid Team

## 2020-11-20 ENCOUNTER — Telehealth (INDEPENDENT_AMBULATORY_CARE_PROVIDER_SITE_OTHER): Payer: Medicaid Other | Admitting: Psychiatry

## 2020-11-20 ENCOUNTER — Other Ambulatory Visit: Payer: Self-pay

## 2020-11-20 ENCOUNTER — Encounter: Payer: Self-pay | Admitting: Psychiatry

## 2020-11-20 DIAGNOSIS — F4310 Post-traumatic stress disorder, unspecified: Secondary | ICD-10-CM

## 2020-11-20 DIAGNOSIS — F3131 Bipolar disorder, current episode depressed, mild: Secondary | ICD-10-CM

## 2020-11-20 DIAGNOSIS — Z79899 Other long term (current) drug therapy: Secondary | ICD-10-CM | POA: Diagnosis not present

## 2020-11-20 DIAGNOSIS — Z944 Liver transplant status: Secondary | ICD-10-CM | POA: Diagnosis not present

## 2020-11-20 DIAGNOSIS — G2111 Neuroleptic induced parkinsonism: Secondary | ICD-10-CM | POA: Diagnosis not present

## 2020-11-20 MED ORDER — BENZTROPINE MESYLATE 1 MG PO TABS: 1.0000 mg | ORAL_TABLET | Freq: Every day | ORAL | 1 refills | Status: DC | PRN

## 2020-11-20 NOTE — Progress Notes (Signed)
Virtual Visit via Video Note  I connected with Alison [name] on 11/20/20 at  2:20 PM EST by a video enabled telemedicine application and verified that I am speaking with the correct person using two identifiers.  Location Provider Location : [facility] Patient Location : Home  Participants: Patient , Provider    I discussed the limitations of evaluation and management by telemedicine and the availability of in person appointments. The patient expressed understanding and agreed to proceed.   I discussed the assessment and treatment plan with the patient. The patient was provided an opportunity to ask questions and all were answered. The patient agreed with the plan and demonstrated an understanding of the instructions.   The patient was advised to call back or seek an in-person evaluation if the symptoms worsen or if the condition fails to improve as anticipated.   [place] MD OP Progress Note  11/20/2020 2:47 PM Alison [name]  MRN:  [id number]  Chief Complaint:  Chief Complaint    Follow-up     HPI: Alison [name] is a 45 year old Caucasian female, lives in [place], has a history of bipolar disorder, PTSD, multiple medical problems including fibromyalgia was evaluated by telemedicine today.  Patient continues to recover from COVID-19 infection.  She tested positive on January 10.  Patient however reports she currently struggles with fatigue as well as shortness of breath.  She reports she is unable to do activities that she used to do before.  She also has back pain which makes it hard for her to stand for too long.  She is currently on Xtampza which helps to some extent.  She reports she continues to follow-up with her providers for the same.  She also had a recent ED visit due to having shortness of breath.  Patient reports she has noticed her tremors and muscle cramps as getting worse.  She was using the Cogentin previously as needed which helped.  She reports she does not know what  could be contributing to this.  She agrees to get in touch with her primary care provider.  There has been no recent medication changes.  Patient denies any significant depression or anxiety symptoms.  She denies any suicidality, homicidality or perceptual disturbances.  She does report sleep problems.  She reports she goes to bed at midnight and wakes up at around 2 AM.  She watches YouTube videos for an hour and goes back to sleep at 3 AM and wakes up at 10 AM.  She reports this is how she has been sleeping since a very long time.  Patient denies any other concerns today.  Visit Diagnosis:    ICD-10-CM   1. Bipolar 1 disorder, depressed, mild ([place])  F31.31   2. PTSD (post-traumatic stress disorder)  F43.10   3. Neuroleptic induced parkinsonism (HCC)  G21.11 benztropine (COGENTIN) 1 MG tablet    Past Psychiatric History: I have reviewed past psychiatric history from my progress note on 06/10/2019.  Past Medical History:  Past Medical History:  Diagnosis Date   Abnormal uterine bleeding    Allergy    Anxiety    Arthritis    Bipolar disorder (manic depression) ([place])    Chronic kidney failure    Chronic renal disease, stage III (HCC)    Diabetes mellitus without complication (HCC)    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[place]) 2015   Right axillary lymph node resected and chemo tx's.   FH: trigeminal neuralgia    GERD (gastroesophageal reflux disease)  Heart murmur    Hypertension    Kidney mass    Lupus ([place])    Lupus (HCC)    Major depressive disorder    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[place]) 06/2019   Chemo tx's   Migraine    Morbid obesity ([place])    Neuromuscular disorder (HCC)    neuropathy   Neuropathy    Personality disorder ([place])    Post herpetic neuralgia    PTSD (post-traumatic stress disorder)    Renal disorder    S/P liver transplant [facility])     Past Surgical History:  Procedure Laterality Date   BONE MARROW BIOPSY  01/13/2015    BREAST BIOPSY  12/2014   BREAST BIOPSY  2011   BREAST SURGERY     CHOLECYSTECTOMY     HERNIA REPAIR     infusaport     LIVER TRANSPLANT  12/17/1991   LUMBAR PUNCTURE     PORT A CATH INJECTION ([facility])     tumor removal  2015    Family Psychiatric History: Reviewed family psychiatric history from my progress note on 06/10/2019.  Family History:  Family History  Problem Relation Age of Onset   Heart disease Mother    Hypertension Mother    Cancer - Other Mother    Bipolar disorder Mother    Heart disease Father    Hypertension Father    Diabetes Father    Parkinson's disease Maternal Grandmother    Cancer Maternal Aunt    Cancer Maternal Uncle    Cancer Maternal Grandfather    Lupus Paternal Grandmother    Hypertension Brother     Social History: Reviewed social history from my progress note on 06/10/2019. Social History   Socioeconomic History   Marital status: Single    Spouse name: Not on file   Number of children: 0   Years of education: 9   Highest education level: GED or equivalent  Occupational History   Occupation: disabled  Tobacco Use   Smoking status: Never Smoker   Smokeless tobacco: Never Used  Scientific laboratory technician Use: Never used  Substance and Sexual Activity   Alcohol use: Not Currently   Drug use: Not Currently    Types: Marijuana    Comment: pain managment last used in early april   Sexual activity: Not Currently  Other Topics Concern   Not on file  Social History Narrative   Not on file   Social Determinants of Health   Financial Resource Strain: Not on file  Food Insecurity: Not on file  Transportation Needs: Not on file  Physical Activity: Not on file  Stress: Not on file  Social Connections: Not on file    Allergies:  Allergies  Allergen Reactions   Penicillin G Itching, Rash, Anaphylaxis and Palpitations   Carbamazepine Other (See Comments)    Medication interaction-prograf    Hydrocodone-Acetaminophen Itching   Naproxen Itching    Metabolic Disorder Labs: Lab Results  Component Value Date   HGBA1C 8.9 (H) 06/05/2020   MPG 209 06/05/2020   Lab Results  Component Value Date   PROLACTIN 13.4 07/15/2019   PROLACTIN 21.3 05/24/2019   Lab Results  Component Value Date   CHOL 193 06/05/2020   TRIG 348 (H) 06/05/2020   HDL 36 (L) 06/05/2020   CHOLHDL 5.4 (H) 06/05/2020   LDLCALC 111 (H) 06/05/2020   Lab Results  Component Value Date   TSH 2.650 07/15/2019   TSH 4.650 (H) 05/24/2019  Therapeutic Level Labs: No results found for: LITHIUM No results found for: VALPROATE No components found for:  CBMZ  Current Medications: Current Outpatient Medications  Medication Sig Dispense Refill   benztropine (COGENTIN) 1 MG tablet Take 1 tablet (1 mg total) by mouth daily as needed for tremors. 30 tablet 1   baclofen (LIORESAL) 20 MG tablet Take 1 tablet (20 mg total) by mouth 3 (three) times daily as needed for muscle spasms. 90 each 2   colchicine 0.6 MG tablet Take 0.6 mg by mouth 2 (two) times daily. Takes once daily     Cranberry 500 MG TABS Take by mouth.     DULoxetine (CYMBALTA) 30 MG capsule Take 1 capsule (30 mg total) by mouth daily. To be combined with 60 mg daily 90 capsule 0   DULoxetine (CYMBALTA) 60 MG capsule Take 1 capsule (60 mg total) by mouth daily. To be combined with 30 mg daily 90 capsule 0   glucose blood (PRECISION QID TEST) test strip Use once daily Use as instructed.     hydrOXYzine (VISTARIL) 25 MG capsule Take 1 capsule (25 mg total) by mouth 2 (two) times daily as needed. For sever anxiety attacks 60 capsule 1   Lactobacillus (PROBIOTIC ACIDOPHILUS PO) Take by mouth.     lamoTRIgine (LAMICTAL) 25 MG tablet Take 1 tablet (25 mg total) by mouth 2 (two) times daily. 180 tablet 0   Lidocaine 0.5 % AERO Apply topically.     liraglutide (VICTOZA) 18 MG/3ML SOPN Inject into the skin.     lisinopril (ZESTRIL) 20 MG tablet  Take 1 tablet (20 mg total) by mouth daily. 90 tablet 3   Magnesium Bisglycinate (MAG GLYCINATE) 100 MG TABS Take by mouth.      magnesium oxide (MAG-OX) 400 MG tablet Take by mouth.     Melatonin 5 MG CAPS Take 1-2 capsules (5-10 mg total) by mouth at bedtime. 100 capsule 0   metFORMIN (GLUCOPHAGE) 1000 MG tablet Take 1 tablet by mouth 2 (two) times a day.     Multiple Vitamin (MULTI-VITAMIN) tablet Take 1 tablet by mouth daily.     oxyCODONE ER 13.5 MG C12A Take 1 capsule by mouth 2 (two) times daily.     pregabalin (LYRICA) 200 MG capsule Take 1 capsule (200 mg total) by mouth 3 (three) times daily. 90 capsule 2   promethazine (PHENERGAN) 25 MG tablet Take 1 tablet by mouth as needed.      QUEtiapine (SEROQUEL XR) 300 MG 24 hr tablet Take 1 tablet (300 mg total) by mouth at bedtime. 90 tablet 0   Specialty Vitamins Products (MAGNESIUM, AMINO ACID CHELATE,) 133 MG tablet Take 1 tablet by mouth 2 (two) times daily.     SUMAtriptan (IMITREX) 100 MG tablet TAKE 1 TABLET BY MOUTH ONCE AS NEEDED FOR MIGRAINE. MAY TAKE A SECOND DOSE AFTER 2 HOURS IF NEEDED     tacrolimus (PROGRAF) 1 MG capsule Take 1 capsule by mouth once daily 30 capsule 0   vitamin B-12 (CYANOCOBALAMIN) 1000 MCG tablet Take 1,000 mcg by mouth daily. Takes 5000 mcg     No current facility-administered medications for this visit.     Musculoskeletal: Strength & Muscle Tone: UTA Gait & Station: UTA Patient leans: N/A  Psychiatric Specialty Exam: Review of Systems  Constitutional: Positive for fatigue.  Respiratory: Positive for shortness of breath.   Neurological: Positive for tremors.  Psychiatric/Behavioral: Positive for sleep disturbance. The patient is nervous/anxious.     Last menstrual period  02/04/2018.There is no height or weight on file to calculate BMI.  General Appearance: Casual  Eye Contact:  Fair  Speech:  Clear and Coherent  Volume:  Normal  Mood:  Anxious coping okay  Affect:  Congruent   Thought Process:  Goal Directed and Descriptions of Associations: Intact  Orientation:  Full (Time, Place, and Person)  Thought Content: Logical   Suicidal Thoughts:  No  Homicidal Thoughts:  No  Memory:  Immediate;   Fair Recent;   Fair Remote;   Fair  Judgement:  Fair  Insight:  Fair  Psychomotor Activity:  Normal  Concentration:  Concentration: Fair and Attention Span: Fair  Recall:  [employer] of Knowledge: Fair  Language: Fair  Akathisia:  No  Handed:  Right  AIMS (if indicated): Does report muscle cramps, tremors as getting worse.  Assets:  Communication Skills Desire for Improvement Housing Social Support  ADL's:  Intact  Cognition: WNL  Sleep:  Restless   Screenings: PHQ2-9   Flowsheet Row Video Visit from 11/20/2020 in [facility] Office Visit from 10/17/2020 in [facility] Visit from 09/26/2020 in [facility] Video Visit from 09/12/2020 in [facility] Office Visit from 07/25/2020 in [facility]  PHQ-2 Total Score 0 _0 PHQ-9 Total Score -- _1 Flowsheet Row Video Visit from 11/20/2020 in [facility] from 11/03/2020 in [facility] No Risk No Risk       Assessment and Plan: Alison [name] is a 45 year old Caucasian female on disability, has a history of bipolar disorder, PTSD, history of borderline personality disorder, chronic pain, fibromyalgia, trigeminal neuralgia, history of liver transplant, B-cell lymphoma, SLE, diabetes melitis, OSA, migraine headaches was evaluated by telemedicine today.  Patient is currently struggling with fatigue, muscle cramps.  She will benefit from the following plan.  Plan Bipolar disorder depressed-improving Lamictal 25 mg p.o. twice daily Seroquel extended release 300 mg p.o. nightly Cymbalta 90 mg p.o.  daily  PTSD-stable Cymbalta 90 mg p.o. daily Hydroxyzine 25 mg p.o. 3 times daily as needed for anxiety attacks Melatonin 5 to 10 mg p.o. nightly. Continue CBT with therapist  Neuroleptic induced Parkinson's disease-unstable Patient with muscle cramps, unknown if this is all due to her current psychotropic medications since she has multiple medical problems and is on multiple medications.  She will talk to her primary care provider. She will benefit from neurology referral. Increase Cogentin to 1 mg p.o. daily as needed.  Follow-up in clinic in 3 weeks or sooner if needed.  I have spent atleast 20 minutes face to face by video with patient today. More than 50 % of the time was spent for preparing to see the patient ( e.g., review of test, records ),  ordering medications and test ,psychoeducation and supportive psychotherapy and care coordination,as well as documenting clinical information in electronic health record. This note was generated in part or whole with voice recognition software. Voice recognition is usually quite accurate but there are transcription errors that can and very often do occur. I apologize for any typographical errors that were not detected and corrected.       [name], MD 11/21/2020, 8:30 AM

## 2020-11-23 ENCOUNTER — Ambulatory Visit (INDEPENDENT_AMBULATORY_CARE_PROVIDER_SITE_OTHER): Payer: Medicaid Other | Admitting: Family Medicine

## 2020-11-23 ENCOUNTER — Other Ambulatory Visit: Payer: Self-pay

## 2020-11-23 ENCOUNTER — Encounter: Payer: Self-pay | Admitting: Family Medicine

## 2020-11-23 VITALS — BP 143/96 | HR 97 | Ht 67.0 in | Wt 261.1 lb

## 2020-11-23 DIAGNOSIS — K754 Autoimmune hepatitis: Secondary | ICD-10-CM

## 2020-11-23 DIAGNOSIS — N2581 Secondary hyperparathyroidism of renal origin: Secondary | ICD-10-CM

## 2020-11-23 DIAGNOSIS — F3130 Bipolar disorder, current episode depressed, mild or moderate severity, unspecified: Secondary | ICD-10-CM

## 2020-11-23 DIAGNOSIS — D47Z1 Post-transplant lymphoproliferative disorder (PTLD): Secondary | ICD-10-CM

## 2020-11-23 DIAGNOSIS — Z1152 Encounter for screening for COVID-19: Secondary | ICD-10-CM

## 2020-11-23 DIAGNOSIS — G2111 Neuroleptic induced parkinsonism: Secondary | ICD-10-CM

## 2020-11-23 DIAGNOSIS — F1129 Opioid dependence with unspecified opioid-induced disorder: Secondary | ICD-10-CM

## 2020-11-23 LAB — POC COVID19 BINAXNOW: SARS Coronavirus 2 Ag: NEGATIVE

## 2020-11-23 NOTE — Progress Notes (Signed)
[id number]

## 2020-11-24 ENCOUNTER — Other Ambulatory Visit: Payer: Self-pay | Admitting: Obstetrics and Gynecology

## 2020-11-24 ENCOUNTER — Other Ambulatory Visit: Payer: Self-pay

## 2020-11-24 NOTE — Patient Outreach (Signed)
Medicaid Managed Care   Nurse Care Manager Note  11/24/2020 Name:  Alison [name] MRN:  [id number] DOB:  24-Mar-1976  Alison [name] is an 45 y.o. year old female who is a primary patient of No primary care provider on file..  The Medicaid Managed Care Coordination team was consulted for assistance with:    chronic healthcare management needs.  [name] was given information about Medicaid Managed Care Coordination team services today. Alison [name] agreed to services and verbal consent obtained.  Engaged with patient by telephone for follow up visit in response to provider referral for case management and/or care coordination services.   Assessments/Interventions:  Review of past medical history, allergies, medications, health status, including review of consultants reports, laboratory and other test data, was performed as part of comprehensive evaluation and provision of chronic care management services.  SDOH (Social Determinants of Health) assessments and interventions performed:   Care Plan  Allergies  Allergen Reactions  . Penicillin G Itching, Rash, Anaphylaxis and Palpitations  . Carbamazepine Other (See Comments)    Medication interaction-prograf  . Hydrocodone-Acetaminophen Itching  . Naproxen Itching    Medications Reviewed Today    Reviewed by [name], RN (Registered Nurse) on 11/24/20 at 1440  Med List Status: <None>  Medication Order Taking? Sig Documenting Provider Last Dose Status Informant  baclofen (LIORESAL) 20 MG tablet [id number] No Take 1 tablet (20 mg total) by mouth 3 (three) times daily as needed for muscle spasms. [name], FNP Taking Active   benztropine (COGENTIN) 1 MG tablet [id number] No Take 1 tablet (1 mg total) by mouth daily as needed for tremors. [name], MD Taking Active   colchicine 0.6 MG tablet [id number] No Take 0.6 mg by mouth 2 (two) times daily. Takes once daily [provider] Taking Active            Med  Note [name] Sep 11, 2020 10:50 AM) PRN  Cranberry 500 MG TABS [id number] No Take by mouth. [provider] Taking Active   DULoxetine (CYMBALTA) 30 MG capsule [id number] No Take 1 capsule (30 mg total) by mouth daily. To be combined with 60 mg daily [name], [name], MD Taking Active   DULoxetine (CYMBALTA) 60 MG capsule [id number] No Take 1 capsule (60 mg total) by mouth daily. To be combined with 30 mg daily [name], [name], MD Taking Active   glucose blood (PRECISION QID TEST) test strip [id number] No Use once daily Use as instructed. [provider] Taking Active   hydrOXYzine (VISTARIL) 25 MG capsule [id number] No Take 1 capsule (25 mg total) by mouth 2 (two) times daily as needed. For sever anxiety attacks [name], MD Taking Active            Med Note [name] Sep 11, 2020 10:50 AM) PRN  Lactobacillus (PROBIOTIC ACIDOPHILUS PO) [id number] No Take by mouth. [provider] Taking Active   lamoTRIgine (LAMICTAL) 25 MG tablet [id number] No Take 1 tablet (25 mg total) by mouth 2 (two) times daily. [name], MD Taking Active   Lidocaine 0.5 % AERO [id number] No Apply topically. [provider] Taking Active   liraglutide (VICTOZA) 18 MG/3ML SOPN [id number] No Inject into the skin. [provider] Taking Active   lisinopril (ZESTRIL) 20 MG tablet [id number] No Take 1 tablet (20 mg total) by mouth daily. Towanda Malkin, MD Taking Active  Med Note [name] Sep 11, 2020 10:47 AM) AM  Magnesium Bisglycinate (MAG GLYCINATE) 100 MG TABS [id number] No Take by mouth.  [provider] Taking Active   magnesium oxide (MAG-OX) 400 MG tablet [id number] No Take by mouth. [provider] Taking Active   Melatonin 5 MG CAPS [id number] No Take 1-2 capsules (5-10 mg total) by mouth at bedtime. [name], MD Taking Active   metFORMIN (GLUCOPHAGE) 1000 MG tablet [id number] No Take 1  tablet by mouth 2 (two) times a day. [provider] Taking Active   Multiple Vitamin (MULTI-VITAMIN) tablet [id number] No Take 1 tablet by mouth daily. [provider] Taking Active   oxyCODONE ER 13.5 MG C12A [id number] No Take 1 capsule by mouth 2 (two) times daily. [provider] Taking Active   pregabalin (LYRICA) 200 MG capsule [id number] No Take 1 capsule (200 mg total) by mouth 3 (three) times daily. [name], MD Taking Expired 06/06/20 2359            Med Note (Alison [name], Alison [name]   Wed Sep 06, 2020  2:51 PM) Taking  promethazine (PHENERGAN) 25 MG tablet [id number] No Take 1 tablet by mouth as needed.  [provider] Taking Active   QUEtiapine (SEROQUEL XR) 300 MG 24 hr tablet [id number] No Take 1 tablet (300 mg total) by mouth at bedtime. [name], MD Taking Active   Specialty Vitamins Products (MAGNESIUM, AMINO ACID CHELATE,) 133 MG tablet [id number] No Take 1 tablet by mouth 2 (two) times daily. [provider] Taking Active   SUMAtriptan (IMITREX) 100 MG tablet [id number] No TAKE 1 TABLET BY MOUTH ONCE AS NEEDED FOR MIGRAINE. MAY TAKE A SECOND DOSE AFTER 2 HOURS IF NEEDED [provider] Taking Active   tacrolimus (PROGRAF) 1 MG capsule [id number] No Take 1 capsule by mouth once daily [name], FNP Taking Active            Med Note [name] Sep 11, 2020 10:48 AM) AM  vitamin B-12 (CYANOCOBALAMIN) 1000 MCG tablet [id number] No Take 1,000 mcg by mouth daily. Takes 5000 mcg [provider] Taking Active           Patient Active Problem List   Diagnosis Date Noted  . Bipolar disorder, in full remission, most recent episode depressed ([place]) 09/27/2020  . Suprapubic pain 07/25/2020  . Bipolar 1 disorder, depressed, mild ([place]) 07/10/2020  . 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 Edema of lower extremity 09/16/2019  . Carpal tunnel syndrome, left 07/26/2019  . Cubital tunnel syndrome on  left 07/26/2019  . Bipolar I disorder, most recent episode depressed ([place]) 07/12/2019  . PTSD (post-traumatic stress disorder) 07/12/2019  . Umbilical hernia without obstruction and without gangrene 06/17/2019  . Encounter for surveillance of abnormal nevi 06/17/2019  . Pineal gland cyst 06/17/2019  . Chronic hip pain, left 06/02/2019  . Lumbar spondylosis 06/02/2019  . Mouth dryness 06/02/2019  . Obesity (BMI 35.0-39.9 without comorbidity) 06/02/2019  . Goals of care, counseling/discussion 05/18/2019  . 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[place]) 05/18/2019  . Occipital neuralgia 05/13/2019  . Plantar fasciitis of left foot 04/27/2019  . Fibromyalgia 03/23/2019  . Systemic lupus erythematosus (SLE) in adult ([place]) 03/23/2019  . Essential hypertension 03/23/2019  . Hepatitis 03/23/2019  . Mass of left kidney 03/23/2019  . Diabetes mellitus ([place]) 03/23/2019  . Nausea without  vomiting 03/23/2019  . Neuropathy 03/23/2019  . Cancer ([place]) 03/23/2019  . Hyperlipidemia 03/23/2019  . Osteoarthritis 03/23/2019  . Trigeminal neuralgia 03/23/2019  . Chronic renal disease, stage III ([place]) 03/23/2019  . Nephrolithiasis 03/23/2019  . Migraine 03/23/2019  . OSA on CPAP 03/23/2019  . Fibrocystic breast 03/23/2019  . Heart murmur 03/23/2019  . Bipolar disorder, in partial remission, most recent episode depressed ([place]) 07/02/2017  . Abnormal uterine bleeding 03/18/2017  . Major depressive disorder, recurrent ([place]) 03/18/2017  . Morbid obesity ([place]) 03/18/2017  . Secondary hyperparathyroidism ([place]) 02/07/2017  . Neuropathic pain 11/19/2016  . Post herpetic neuralgia 11/19/2016  . 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[place]) 01/13/2015  . Lumbar disc disease with radiculopathy 07/26/2013  . S/P liver transplant ([place]) 07/26/2013  . Type 2 diabetes mellitus, uncontrolled ([place]) 07/26/2013  . Facial  nerve disorder 06/29/2012  . Low back pain 12/26/2010    Conditions to be addressed/monitored per PCP order:  chronic healthcare management needs, Bipolar/PTSD, chronic pain, DM2, HTN, gout, lymphoma,  Care Plan : Chronic Pain (Adult)  Updates made by [name], RN since 11/24/2020 12:00 AM    Problem: Chronic Pain Management-fibromyalgia     Long-Range Goal: Fibromyalgia pain managed-new pain management provider   Start Date: 08/14/2020  Expected End Date: 02/21/2021  Recent Progress: On track  Priority: High  Note:   Current Barriers:  . Care Coordination needs related to pain management provider.   Patient is experiencing fibromyalgia flair and is interested in help being connected to a pain management provider. . Unable to independently secure referral or appointment for pain management provider. [name] Update 11/24/20:  Patient is being followed by Dr. Humphrey Rolls.  Nurse Case Manager Clinical Goal(s):  [name], patient will work with [facility] to address needs related to referral for pain management provider and associated care coordination needs. [name] Update 09/22/20:  patient is currently seeing Dr. Humphrey Rolls at [facility].  Interventions:  . Inter-disciplinary care team collaboration (see longitudinal plan of care) . Evaluation of current treatment plan related to fibromyalgia  and patient's adherence to plan as established by provider. [name] with primary care provider regarding recommendations and referral to pain management provider. . Discussed plans with patient for ongoing care management follow up and provided patient with direct contact information for care management team . Anticipate pain education program, pain management support as part of pain management referral.      . [name] contacted patient to check in. Patient stated she has not contacted an eye doctor nor has she seen a specialist. Patient states she thinks she has COVID due to her  husband having it. She has not taken a test yet, but has some symptoms. She is isolating in the bedroom and they have someone to cook and bring her food.       [name] Update 11/16/20: [name] completed phone call with patient, she states she is still having symptoms from [place] and has been in bed all month. Patient states she has not had a chance to contact an eye doctor or specialist due to being sick. [name] Update 11/24/20:  Patient with continued fatigue-will follow up with PCP. [name] Pharmacy referral for medication review.  Patient Goals/Self-Care Activities Over the next 45 days, patient will:  -Attends all scheduled provider appointments Anticipate communication from PCP or pain management specialist office for new patient appointment.  develop a personal pain management plan with your pain management provider when  appointment arranged.  Schedule follow up appointment with PCP.  Follow Up Plan: RN Care Manager will collaborate with PCP regarding pain management referral. The Managed Medicaid care management team will reach out to the patient again over the next 30 days.       Follow Up:  Patient agrees to Care Plan and Follow-up.  Plan: The Managed Medicaid care management team will reach out to the patient again over the next 30 days. and The patient has been provided with contact information for the Managed Medicaid care management team and has been advised to call with any health related questions or concerns.  Date/time of next scheduled RN care management/care coordination outreach:  12/22/20 at 1030.

## 2020-11-24 NOTE — Patient Instructions (Signed)
Hi Ms. Macy, thank you for speaking with me today.  Ms. Quesnel was given information about Medicaid Managed Care team care coordination services as a part of their Healthy [facility] Medicaid benefit. Jianni Shelden verbally consented to engagement with the [facility] Managed Care team.   For questions related to your Healthy [facility] health plan, please call: [phone] or visit the homepage here: [email or web address]  If you would like to schedule transportation through your Healthy [facility] plan, please call the following number at least 2 days in advance of your appointment: [phone]  Ms. Mcgruder - following are the goals we discussed in your visit today:  Goals Addressed            This Visit's Progress   . Chronic Pain Managed       Evidence-based guidance:   Address common beliefs about pain, such as pain is to be endured, a normal part of aging or that it is not "real"; feelings of resignation that nothing can be done and that complaining will be a sign of weakness.   Assess pain level, treatment efficacy and patient response at regular intervals using a consistent pain scale.   Assess pain using self-report (most reliable), family/caregiver report, validated pain scale; consider impact on quality of life.   Determine if pain is associated with mobility or at rest, location, intensity, frequency, duration, recurrence, pattern and description (e.g., cramping, burning, aching), triggers and relieving factors.    Anticipate referral to pain education program, pain management support or community resources for specific diagnoses (e.g., cancer, fibromyalgia, multiple sclerosis).   Explore fears associated with anticipated or imagined pain; encourage acceptance-based approaches.   Encourage exposure to experiences previously avoided due to fear of pain.   Anticipate referral to pain management specialist, physical therapist, addiction  specialist (if history of substance use), psychotherapist; advocate for consultation with pharmacist.   Initiate nonpharmacologic measures, such as cognitive behavior therapy, mindfulness, guided imagery, massage, distraction, relaxation, chiropractic manipulation, dietary supplements or acupuncture.   Provide multimodal treatment interventions, such as physical activity, therapeutic exercise, yoga, TENS (transcutaneous electrical nerve stimulation) and manual therapy.   Train in functional activity modifications, such as body mechanics, posture, ergonomics, energy conservation and activity pacing.   Encourage use of local anesthetic or analgesic therapy as an adjunct for pain control (e.g., lidocaine patch, capsaicin cream, topical nonsteroidal anti-inflammatory drugs).   Prepare patient for use of pharmacologic therapy in a stepped approach that may include acetaminophen, nonsteroidal anti-inflammatory drugs, opioid, antiepileptic, antidepressant or nonbenzodiazepine muscle relaxant.  Review efficacy, tolerability, adherence and manage medication-induced side effects.        Patient verbalizes understanding of instructions provided today.   The Managed Medicaid care management team will reach out to the patient again over the next 30 days.  The patient has been provided with contact information for the Managed Medicaid care management team and has been advised to call with any health related questions or concerns.   [name], [name] [place] Management Coordinator - Managed Medicaid High Risk [phone]  Following is a copy of your plan of care:  Patient Care Plan: Chronic Pain (Adult)    Problem Identified: Chronic Pain Management-fibromyalgia     Long-Range Goal: Fibromyalgia pain managed-new pain management provider   Start Date: 08/14/2020  Expected End Date: 02/21/2021  Recent Progress: On track  Priority: High  Note:   Current  Barriers:  . Care Coordination needs related to pain management provider.  Patient is experiencing fibromyalgia flair and is interested in help being connected to a pain management provider. . Unable to independently secure referral or appointment for pain management provider. [name] Update 11/24/20:  Patient is being followed by Dr. Humphrey Rolls.  Nurse Case Manager Clinical Goal(s):  [name], patient will work with [facility] to address needs related to referral for pain management provider and associated care coordination needs. [name] Update 09/22/20:  patient is currently seeing Dr. Humphrey Rolls at [facility].  Interventions:  . Inter-disciplinary care team collaboration (see longitudinal plan of care) . Evaluation of current treatment plan related to fibromyalgia  and patient's adherence to plan as established by provider. [name] with primary care provider regarding recommendations and referral to pain management provider. . Discussed plans with patient for ongoing care management follow up and provided patient with direct contact information for care management team . Anticipate pain education program, pain management support as part of pain management referral.      . [name] contacted patient to check in. Patient stated she has not contacted an eye doctor nor has she seen a specialist. Patient states she thinks she has COVID due to her husband having it. She has not taken a test yet, but has some symptoms. She is isolating in the bedroom and they have someone to cook and bring her food.       [name] Update 11/16/20: [name] completed phone call with patient, she states she is still having symptoms from [place] and has been in bed all month. Patient states she has not had a chance to contact an eye doctor or specialist due to being sick. [name] Update 11/24/20:  Patient with continued fatigue-will follow up with PCP. [name] Pharmacy referral for medication review.  Patient Goals/Self-Care  Activities Over the next 45 days, patient will:  -Attends all scheduled provider appointments Anticipate communication from PCP or pain management specialist office for new patient appointment.  develop a personal pain management plan with your pain management provider when appointment arranged.  Schedule follow up appointment with PCP.  Follow Up Plan: RN Care Manager will collaborate with PCP regarding pain management referral. The Managed Medicaid care management team will reach out to the patient again over the next 30 days.     Patient Care Plan: Wellness (Adult)    Problem Identified: Medication Adherence (Wellness)     Goal: Medication Adherence Maintained   Start Date: 09/06/2020  Expected End Date: 12/05/2020  This Visit's Progress: Not on track  Priority: High  Note:   Current Barriers:  . Patient states she needs to have prescriptions refilled and feels she has some side effects from her medications.  Nurse Case Manager Clinical Goal(s):  [name] Over the next 30 days, patient will work with CM team pharmacist to review current medications.  Interventions:  . Inter-disciplinary care team collaboration (see longitudinal plan of care) . Evaluation of current treatment plan and patient's adherence to plan as established by provider. . Advised patient to contact her PCP for any medication needs. . Reviewed medications with patient. [name] with pharmacy regarding medications.  . Discussed plans with patient for ongoing care management follow up and provided patient with direct contact information for care management team . Pharmacy referral for medication review  Patient Goals/Self-Care Activities Over the next 30 days, patient will:  -Patient will take medications as prescribed. RNCM will follow up with patient within 30 days and make referral to pharmacy. Calls pharmacy  for medication refills Calls provider office for new concerns or questions  Follow Up Plan: The  Managed Medicaid care management team will reach out to the patient again over the next 30 days.  The patient has been provided with contact information for the Managed Medicaid care management team and has been advised to call with any health related questions or concerns.

## 2020-11-27 DIAGNOSIS — R3129 Other microscopic hematuria: Secondary | ICD-10-CM | POA: Diagnosis not present

## 2020-11-27 DIAGNOSIS — N3000 Acute cystitis without hematuria: Secondary | ICD-10-CM | POA: Diagnosis not present

## 2020-12-01 ENCOUNTER — Telehealth: Payer: Self-pay | Admitting: Oncology

## 2020-12-01 NOTE — Telephone Encounter (Signed)
Called pt to r/s appt from 2/28 to 3/18. Patient agreeable to change. Sending AVS.

## 2020-12-01 NOTE — Telephone Encounter (Signed)
Called pt to r/s appt from 2/28 to 3/18. Patient agreeable to change.Pt also mentioned she's been experiencing night sweats. Sending AVS.

## 2020-12-04 ENCOUNTER — Encounter: Payer: Self-pay | Admitting: Family Medicine

## 2020-12-04 ENCOUNTER — Ambulatory Visit (INDEPENDENT_AMBULATORY_CARE_PROVIDER_SITE_OTHER): Payer: Medicaid Other | Admitting: Family Medicine

## 2020-12-04 ENCOUNTER — Inpatient Hospital Stay: Payer: Medicaid Other

## 2020-12-04 ENCOUNTER — Inpatient Hospital Stay: Payer: Medicaid Other | Admitting: Oncology

## 2020-12-04 ENCOUNTER — Telehealth: Payer: Self-pay

## 2020-12-04 DIAGNOSIS — U071 COVID-19: Secondary | ICD-10-CM

## 2020-12-04 DIAGNOSIS — C8330 Diffuse large B-cell lymphoma, unspecified site: Secondary | ICD-10-CM | POA: Diagnosis not present

## 2020-12-04 DIAGNOSIS — M329 Systemic lupus erythematosus, unspecified: Secondary | ICD-10-CM | POA: Diagnosis not present

## 2020-12-04 DIAGNOSIS — E1165 Type 2 diabetes mellitus with hyperglycemia: Secondary | ICD-10-CM

## 2020-12-04 MED ORDER — PROMETHAZINE HCL 25 MG PO TABS: 25.0000 mg | ORAL_TABLET | ORAL | 0 refills | Status: DC | PRN

## 2020-12-04 MED ORDER — LIRAGLUTIDE 18 MG/3ML ~~LOC~~ SOPN: 0.6000 mg | PEN_INJECTOR | Freq: Every day | SUBCUTANEOUS | 2 refills | Status: DC

## 2020-12-04 NOTE — Progress Notes (Signed)
Virtual Visit via Video Note  I connected with Alison [name] on 12/04/20 at  3:20 PM EST by a video enabled telemedicine application and verified that I am speaking with the correct person using two identifiers.  Location: Patient: home Provider: [facility]   I discussed the limitations of evaluation and management by telemedicine and the availability of in person appointments. The patient expressed understanding and agreed to proceed.  History of Present Illness:  COVID INFECTION - COVID positive 10/17/20 - symptom onset 10/13/20 - fully vaccinated including booster - negative Ag test 2/17. - ongoing symptoms: diarrhea, confusion, brain fog, cough at night, fatigue, shortness of breath with hypoxia to low 90s with normal activities, dizziness - no changes in meds recently. - diarrhea - no blood in stool, 1-3 BMs per day. With constipation prior to COVID. No abd pain, fevers. - cough, SOB - nonproductive cough. Mainly with exertion. Slight chest pain with deep breathing, lower left side. No congestion. Negative CXR 11/03/20 at ED visit.  - confusion - easily forgetful, hard to remember things she normally would remember.  - worsened neuralgia, follows with Neuro. - CBGs 200-300s. Forgets victoza often, previously was taking TID. Some increased thirst and urination. Has not seen Endo, recently dismissed due to no shows, asking for new referral.    Observations/Objective:  Patient had trouble connecting to video visit, entirety of visit conducted over the phone.  Speaks in full sentences, no respiratory distress.  Assessment and Plan:  S/p COVID infection With multiple ongoing symptoms, though with extensive and complicated medical history including uncontrolled diabetes, neuralgia, lupus, difficult to fully attribute to potential long-COVID syndrome, especially given borderline DKA when presented to ED 1/28 for same and ongoing noncompliance with diabetic medication could certainly present  with similar symptoms. Also difficult to fully assess without physical exam due to phone visit. Recommend in-person appointment for further evaluation with PE and labs given outside of COVID infectious window and negative Ag testing since. Refill provided for victoza and phenergan and reviewed med regimen with patient. Emergency precautions reviewed.    I discussed the assessment and treatment plan with the patient. The patient was provided an opportunity to ask questions and all were answered. The patient agreed with the plan and demonstrated an understanding of the instructions.   The patient was advised to call back or seek an in-person evaluation if the symptoms worsen or if the condition fails to improve as anticipated.  I provided 16 minutes of non-face-to-face time during this encounter.   Myles Gip, DO

## 2020-12-04 NOTE — Patient Instructions (Signed)
It was great to see you!  Our plans for today:  - We sent refills of your victoza to the pharmacy. Be sure to take this once daily and log your blood sugar readings.  - Make an in-person appointment soon for follow up.   Take care and seek immediate care sooner if you develop any concerns.   Dr. Ky Barban

## 2020-12-04 NOTE — Telephone Encounter (Signed)
Copied from [place] [id number]. Topic: General - Other >> Dec 04, 2020  3:34 PM Pawlus, Brayton Layman A wrote: Reason for CRM: Pt called in and did not know how to use her MyChart for her video visit, I attempted to walk her through the process step by step but was still unable to join the video call. Pt requested to be called back since I was unable to get through to the office.

## 2020-12-04 NOTE — Telephone Encounter (Signed)
I have reached out to this patient via [place].

## 2020-12-04 NOTE — Telephone Encounter (Signed)
That is fine 

## 2020-12-05 ENCOUNTER — Telehealth: Payer: Medicaid Other | Admitting: Family Medicine

## 2020-12-07 ENCOUNTER — Other Ambulatory Visit: Payer: Self-pay | Admitting: Family Medicine

## 2020-12-07 DIAGNOSIS — E1165 Type 2 diabetes mellitus with hyperglycemia: Secondary | ICD-10-CM

## 2020-12-11 ENCOUNTER — Other Ambulatory Visit: Payer: Self-pay

## 2020-12-11 ENCOUNTER — Encounter: Payer: Self-pay | Admitting: Family Medicine

## 2020-12-11 ENCOUNTER — Ambulatory Visit (INDEPENDENT_AMBULATORY_CARE_PROVIDER_SITE_OTHER): Payer: Medicaid Other | Admitting: Family Medicine

## 2020-12-11 VITALS — BP 124/82 | HR 96 | Temp 98.0°F | Resp 16 | Ht 67.0 in | Wt 261.0 lb

## 2020-12-11 DIAGNOSIS — I10 Essential (primary) hypertension: Secondary | ICD-10-CM | POA: Diagnosis not present

## 2020-12-11 DIAGNOSIS — N183 Chronic kidney disease, stage 3 unspecified: Secondary | ICD-10-CM

## 2020-12-11 DIAGNOSIS — E1165 Type 2 diabetes mellitus with hyperglycemia: Secondary | ICD-10-CM | POA: Diagnosis not present

## 2020-12-11 DIAGNOSIS — E785 Hyperlipidemia, unspecified: Secondary | ICD-10-CM | POA: Diagnosis not present

## 2020-12-11 DIAGNOSIS — R309 Painful micturition, unspecified: Secondary | ICD-10-CM | POA: Diagnosis not present

## 2020-12-11 DIAGNOSIS — R3915 Urgency of urination: Secondary | ICD-10-CM

## 2020-12-11 DIAGNOSIS — M329 Systemic lupus erythematosus, unspecified: Secondary | ICD-10-CM

## 2020-12-11 LAB — POCT URINALYSIS DIPSTICK
Bilirubin, UA: NEGATIVE
Blood, UA: NEGATIVE
Glucose, UA: NEGATIVE
Ketones, UA: POSITIVE
Nitrite, UA: NEGATIVE
Protein, UA: NEGATIVE
Spec Grav, UA: 1.025 (ref 1.010–1.025)
Urobilinogen, UA: 0.2 E.U./dL
pH, UA: 6.5 (ref 5.0–8.0)

## 2020-12-11 LAB — POCT GLYCOSYLATED HEMOGLOBIN (HGB A1C): Hemoglobin A1C: 8.9 % — AB (ref 4.0–5.6)

## 2020-12-11 MED ORDER — CIPROFLOXACIN HCL 500 MG PO TABS
500.0000 mg | ORAL_TABLET | Freq: Every day | ORAL | 0 refills | Status: DC
Start: 2020-12-11 — End: 2020-12-19

## 2020-12-11 MED ORDER — METFORMIN HCL 1000 MG PO TABS: 1000.0000 mg | ORAL_TABLET | Freq: Two times a day (BID) | ORAL | 3 refills | Status: DC

## 2020-12-11 MED ORDER — TRULICITY 0.75 MG/0.5ML ~~LOC~~ SOAJ: 0.7500 mg | SUBCUTANEOUS | 3 refills | Status: DC

## 2020-12-11 NOTE — Patient Instructions (Addendum)
It was great to see you!  Our plans for today:  - We are starting a new medication for your diabetes called Trulicity. This works similarly to [employer] but is once weekly injection instead of daily. Make sure to take this on the same day every week.  - See below for tips on eating a healthy diet with diabetes.  - Take the antibiotic as prescribed. - Come back in 4 weeks for follow up.   We are checking some labs today, we will release these results to your MyChart.  Take care and seek immediate care sooner if you develop any concerns.   Dr. Ky Barban  Here is an example of what a healthy plate looks like:    ? Make half your plate fruits and vegetables.     ? Focus on whole fruits.     ? Vary your veggies.  ? Make half your grains whole grains. -     ? Look for the word "whole" at the beginning of the ingredients list    ? Some whole-grain ingredients include whole oats, whole-wheat flour,        whole-grain corn, whole-grain brown rice, and whole rye.  ? Move to low-fat and fat-free milk or yogurt.  ? Vary your protein routine. - Meat, fish, poultry (chicken, [place]), eggs, beans (kidney, pinto), dairy.  ? Drink and eat less sodium, saturated fat, and added sugars.   Diet Recommendations for Diabetes   1. Eat at least 3 meals and 1-2 snacks per day. Never go more than 4-5 hours while awake without eating. Eat breakfast within the first hour of getting up.   2. Limit starchy foods to TWO per meal and ONE per snack. ONE portion of a starchy  food is equal to the following:   - ONE slice of bread (or its equivalent, such as half of a hamburger bun).   - 1/2 cup of a "scoopable" starchy food such as potatoes or rice.   - 15 grams of Total Carbohydrate as shown on food label.  3. Include at every meal: a protein food, a carb food, and vegetables and/or fruit.   - Obtain twice the volume of vegetables as protein or carbohydrate foods for both lunch and dinner.   - Fresh or frozen  vegetables are best.   - Keep frozen vegetables on hand for a quick vegetable serving.       Starchy (carb) foods: Bread, rice, pasta, potatoes, corn, cereal, grits, crackers, bagels, muffins, all baked goods.  (Fruits, milk, and yogurt also have carbohydrate, but most of these foods will not spike your blood sugar as most starchy foods will.)  A few fruits do cause high blood sugars; use small portions of bananas (limit to 1/2 at a time), grapes, watermelon, oranges, and most tropical fruits.    Protein foods: Meat, fish, poultry, eggs, dairy foods, and beans such as pinto and kidney beans (beans also provide carbohydrate).

## 2020-12-11 NOTE — Progress Notes (Signed)
° °  SUBJECTIVE:   CHIEF COMPLAINT / HPI:   Hypertension: - Medications: lisinopril 77m - Compliance: good - Checking BP at home: no - Denies any SOB, CP, vision changes, LE edema, medication SEs, or symptoms of hypotension  Diabetes, Type 2 - Last A1c 8.9 05/2020 - Medications: victoza 0.630mdaily, metformin 100023mID - Compliance: missed ~2 doses of victoza.  - Checking BG at home: yes, 200s - Diet: higher glycemic foods - Exercise: none - Eye exam: due - Foot exam: due - Microalbumin: due - Statin: no - Denies symptoms of hypoglycemia, polyuria, polydipsia, numbness extremities, foot ulcers/trauma - recently referred to Endo  Dysuria - dysuria, increased urgency, foul odor x2 weeks.  - have had a few accidents recently - went to UC, got macrobid, finished Thursday with return of symptoms on Saturday.  - no fevers - no new back or belly pain - no hematuria.   [facility]up[facility]   OBJECTIVE:   BP 124/82    Pulse 96    Temp 98 F (36.7 C)    Resp 16    Ht 5' 7"  (1.702 m)    Wt 261 lb (118.4 kg)    LMP 02/04/2018 (Approximate) Comment: Per patient, over a year ago   SpO2 98%    BMI 40.88 kg/m   Gen: well appearing, obese, in NAD Card: RRR Lungs: CTAB Ext: WWP, no edema. Foot exam wnl.   ASSESSMENT/PLAN:   Type 2 diabetes mellitus, uncontrolled (HCC) Uncontrolled, a1c 8.9 today. With some missed doses of victoza, will switch to once weekly GLP1 agonist, trulicity. Continue metformin. F/u in 4 weeks, at that time, expect titration of trulicity. Foot exam wnl today. Referral placed for optho exam. not currently on statin, would benefit. Will obtain lipids today.   Essential hypertension Doing well on current regimen, no changes made today.  Chronic renal disease, stage III (HCCLiverpoolecheck labs today and urine microalbumin.  Systemic lupus erythematosus (SLE) in adult ([facility] to follow with rheum.   Morbid obesity  (HCCAdinontributing to DM, HTN, chronic pain. Recommend weight loss through diet and exercise, handout provided.   Hyperlipidemia Not currently on statin, recheck lipids today.  Dysuria U/A with positive leuks. Will treat based on prior urine culture results with cipro x3 days given h/o anaphylaxis with PCN, no prior cephalosporin use per chart review and recent macrobid use. Urine sent for culture, will adjust if needed once resulted.  AliMyles GipO

## 2020-12-11 NOTE — Assessment & Plan Note (Signed)
Doing well on current regimen, no changes made today. 

## 2020-12-11 NOTE — Assessment & Plan Note (Signed)
Contributing to DM, HTN, chronic pain. Recommend weight loss through diet and exercise, handout provided.

## 2020-12-11 NOTE — Assessment & Plan Note (Signed)
Recheck labs today and urine microalbumin.

## 2020-12-11 NOTE — Assessment & Plan Note (Signed)
Not currently on statin, recheck lipids today.

## 2020-12-11 NOTE — Assessment & Plan Note (Signed)
Continue to follow with rheum.

## 2020-12-11 NOTE — Assessment & Plan Note (Addendum)
Uncontrolled, a1c 8.9 today. With some missed doses of victoza, will switch to once weekly GLP1 agonist, trulicity. Continue metformin. F/u in 4 weeks, at that time, expect titration of trulicity. Foot exam wnl today. Referral placed for optho exam. not currently on statin, would benefit. Will obtain lipids today.

## 2020-12-12 ENCOUNTER — Other Ambulatory Visit: Payer: Self-pay | Admitting: Family Medicine

## 2020-12-12 LAB — COMPLETE METABOLIC PANEL WITH GFR
AG Ratio: 1.4 (calc) (ref 1.0–2.5)
ALT: 79 U/L — ABNORMAL HIGH (ref 6–29)
AST: 109 U/L — ABNORMAL HIGH (ref 10–35)
Albumin: 4.2 g/dL (ref 3.6–5.1)
Alkaline phosphatase (APISO): 78 U/L (ref 31–125)
BUN: 19 mg/dL (ref 7–25)
CO2: 24 mmol/L (ref 20–32)
Calcium: 10.1 mg/dL (ref 8.6–10.2)
Chloride: 100 mmol/L (ref 98–110)
Creat: 1.04 mg/dL (ref 0.50–1.10)
GFR, Est African American: 74 mL/min/{1.73_m2} (ref >=60)
GFR, Est Non African American: 64 mL/min/{1.73_m2} (ref >=60)
Globulin: 3.1 g/dL (calc) (ref 1.9–3.7)
Glucose, Bld: 206 mg/dL — ABNORMAL HIGH (ref 65–99)
Potassium: 4.3 mmol/L (ref 3.5–5.3)
Sodium: 136 mmol/L (ref 135–146)
Total Bilirubin: 0.6 mg/dL (ref 0.2–1.2)
Total Protein: 7.3 g/dL (ref 6.1–8.1)

## 2020-12-12 LAB — LIPID PANEL
Cholesterol: 219 mg/dL — ABNORMAL HIGH (ref <200)
HDL: 38 mg/dL — ABNORMAL LOW (ref >=50)
LDL Cholesterol (Calc): 133 mg/dL (calc) — ABNORMAL HIGH
Non-HDL Cholesterol (Calc): 181 mg/dL (calc) — ABNORMAL HIGH (ref <130)
Total CHOL/HDL Ratio: 5.8 (calc) — ABNORMAL HIGH (ref <5.0)
Triglycerides: 333 mg/dL — ABNORMAL HIGH (ref <150)

## 2020-12-12 LAB — URINE CULTURE
MICRO NUMBER:: 11615870
SPECIMEN QUALITY:: ADEQUATE

## 2020-12-12 MED ORDER — ROSUVASTATIN CALCIUM 5 MG PO TABS: 5.0000 mg | ORAL_TABLET | Freq: Every day | ORAL | 3 refills | Status: DC

## 2020-12-14 ENCOUNTER — Other Ambulatory Visit: Payer: Self-pay

## 2020-12-14 NOTE — Patient Outreach (Signed)
Medicaid Managed Care Social Work Note  12/14/2020 Name:  Alison [name] MRN:  [id number] DOB:  1976-07-26  Alison [name] is an 45 y.o. year old female who is a primary patient of [place].  The Medicaid Managed Care Coordination team was consulted for assistance with:  Community Resources   Ms. Heick was given information about Medicaid Managed CareCoordination services today. Mailee Klaas agreed to services and verbal consent obtained.  Engaged with patient  for by telephone forfollow up visit in response to referral for case management and/or care coordination services.   Assessments/Interventions:  Review of past medical history, allergies, medications, health status, including review of consultants reports, laboratory and other test data, was performed as part of comprehensive evaluation and provision of chronic care management services.  SDOH: (Social Determinant of Health) assessments and interventions performed:  [name] completed call with patient, she stated she is waiting on a call from [facility] about getting a liver biopsy completed. She has not reached out to the eye doctor yet because her liver is top priority right now.    Advanced Directives Status:  Not addressed in this encounter.  Care Plan                 Allergies  Allergen Reactions  . Penicillin G Itching, Rash, Anaphylaxis and Palpitations  . Carbamazepine Other (See Comments)    Medication interaction-prograf  . Hydrocodone-Acetaminophen Itching  . Naproxen Itching    Medications Reviewed Today    Reviewed by Myles Gip, DO (Physician) on 12/11/20 at 1523  Med List Status: <None>  Medication Order Taking? Sig Documenting Provider Last Dose Status Informant  baclofen (LIORESAL) 20 MG tablet [id number] Yes Take 1 tablet (20 mg total) by mouth 3 (three) times daily as needed for muscle spasms. [name], FNP Taking Active   benztropine (COGENTIN) 1 MG tablet [id number] Yes Take  1 tablet (1 mg total) by mouth daily as needed for tremors. [name], MD Taking Active   Cranberry 500 MG TABS [id number] Yes Take by mouth. [provider] Taking Active   Dulaglutide (TRULICITY) 7.54 GB/2.0FE SOPN [id number] Yes Inject 0.75 mg into the skin once a week. Myles Gip, DO  Active   DULoxetine (CYMBALTA) 30 MG capsule [id number] Yes Take 1 capsule (30 mg total) by mouth daily. To be combined with 60 mg daily [name], [name], MD Taking Active   DULoxetine (CYMBALTA) 60 MG capsule [id number] Yes Take 1 capsule (60 mg total) by mouth daily. To be combined with 30 mg daily [name], [name], MD Taking Active   glucose blood (PRECISION QID TEST) test strip [id number] Yes Use once daily Use as instructed. [provider] Taking Active   hydrOXYzine (VISTARIL) 25 MG capsule [id number] Yes Take 1 capsule (25 mg total) by mouth 2 (two) times daily as needed. For sever anxiety attacks [name], MD Taking Active            Med Note [name] Sep 11, 2020 10:50 AM) PRN  Lactobacillus (PROBIOTIC ACIDOPHILUS PO) [id number] Yes Take by mouth. [provider] Taking Active   lamoTRIgine (LAMICTAL) 25 MG tablet [id number] Yes Take 1 tablet (25 mg total) by mouth 2 (two) times daily. [name], MD Taking Active   Lidocaine 0.5 % AERO [id number] Yes Apply topically. [provider] Taking Active   lisinopril (ZESTRIL) 20 MG tablet [id number] Yes Take 1 tablet (20 mg total) by mouth daily. Towanda Malkin, MD Taking  Active            Med Note [name] Sep 11, 2020 10:47 AM) AM  magnesium oxide (MAG-OX) 400 MG tablet [id number] Yes Take by mouth. [provider] Taking Active   Melatonin 5 MG CAPS [id number] Yes Take 1-2 capsules (5-10 mg total) by mouth at bedtime. [name], MD Taking Active   metFORMIN (GLUCOPHAGE) 1000 MG tablet [id number]  Take 1 tablet (1,000 mg total) by mouth 2 (two) times daily with  a meal. Myles Gip, DO  Active   Multiple Vitamin (MULTI-VITAMIN) tablet [id number] Yes Take 1 tablet by mouth daily. [provider] Taking Active   oxyCODONE ER 13.5 MG C12A [id number] Yes Take 1 capsule by mouth 2 (two) times daily. [provider] Taking Active   pregabalin (LYRICA) 200 MG capsule [id number]  Take 1 capsule (200 mg total) by mouth 3 (three) times daily. [name], MD  Expired 06/06/20 2359            Med Note (CRAFT, [name]   Wed Sep 06, 2020  2:51 PM) Taking  promethazine (PHENERGAN) 25 MG tablet [id number] Yes Take 1 tablet (25 mg total) by mouth as needed. Myles Gip, DO Taking Active   QUEtiapine (SEROQUEL XR) 300 MG 24 hr tablet [id number] Yes Take 1 tablet (300 mg total) by mouth at bedtime. [name], MD Taking Active   SUMAtriptan (IMITREX) 100 MG tablet [id number] Yes TAKE 1 TABLET BY MOUTH ONCE AS NEEDED FOR MIGRAINE. MAY TAKE A SECOND DOSE AFTER 2 HOURS IF NEEDED [provider] Taking Active   tacrolimus (PROGRAF) 1 MG capsule [id number] Yes Take 1 capsule by mouth once daily [name], FNP Taking Active            Med Note [name] Sep 11, 2020 10:48 AM) AM  vitamin B-12 (CYANOCOBALAMIN) 1000 MCG tablet [id number] Yes Take 1,000 mcg by mouth daily. Takes 5000 mcg [provider] Taking Active           Patient Active Problem List   Diagnosis Date Noted  . Bipolar disorder, in full remission, most recent episode depressed ([place]) 09/27/2020  . Suprapubic pain 07/25/2020  . Bipolar 1 disorder, depressed, mild ([place]) 07/10/2020  . 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 Edema of lower extremity 09/16/2019  . Carpal tunnel syndrome, left 07/26/2019  . Cubital tunnel syndrome on left 07/26/2019  . Bipolar I disorder, most recent episode depressed ([place]) 07/12/2019  . PTSD (post-traumatic stress disorder) 07/12/2019  . Umbilical hernia without obstruction and without gangrene  06/17/2019  . Encounter for surveillance of abnormal nevi 06/17/2019  . Pineal gland cyst 06/17/2019  . Chronic hip pain, left 06/02/2019  . Lumbar spondylosis 06/02/2019  . Mouth dryness 06/02/2019  . Obesity (BMI 35.0-39.9 without comorbidity) 06/02/2019  . Goals of care, counseling/discussion 05/18/2019  . 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 Occipital neuralgia 05/13/2019  . Plantar fasciitis of left foot 04/27/2019  . Fibromyalgia 03/23/2019  . Systemic lupus erythematosus (SLE) in adult ([place]) 03/23/2019  . Essential hypertension 03/23/2019  . Hepatitis 03/23/2019  . Mass of left kidney 03/23/2019  . Diabetes mellitus ([place]) 03/23/2019  . Nausea without vomiting 03/23/2019  . Neuropathy 03/23/2019  . Cancer ([place]) 03/23/2019  . Hyperlipidemia 03/23/2019  . Osteoarthritis 03/23/2019  . Trigeminal neuralgia 03/23/2019  . Chronic  renal disease, stage III ([place]) 03/23/2019  . Nephrolithiasis 03/23/2019  . Migraine 03/23/2019  . OSA on CPAP 03/23/2019  . Fibrocystic breast 03/23/2019  . Heart murmur 03/23/2019  . Bipolar disorder, in partial remission, most recent episode depressed ([place]) 07/02/2017  . Abnormal uterine bleeding 03/18/2017  . Major depressive disorder, recurrent ([place]) 03/18/2017  . Morbid obesity ([place]) 03/18/2017  . Secondary hyperparathyroidism ([place]) 02/07/2017  . Neuropathic pain 11/19/2016  . Post herpetic neuralgia 11/19/2016  . 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 Lumbar disc disease with radiculopathy 07/26/2013  . S/P liver transplant ([place]) 07/26/2013  . Type 2 diabetes mellitus, uncontrolled ([place]) 07/26/2013  . Facial nerve disorder 06/29/2012  . Low back pain 12/26/2010    Conditions to be addressed/monitored per PCP order:  specialist and eye doctor  Care Plan : Chronic Pain (Adult)  Updates made by Ethelda Chick since 12/14/2020 12:00 AM    Problem: Chronic Pain Management-fibromyalgia     Long-Range Goal: Fibromyalgia pain managed-new pain management provider   Start Date: 08/14/2020  Expected End Date: 02/21/2021  Recent Progress: On track  Priority: High  Note:   Current Barriers:  . Care Coordination needs related to pain management provider.   Patient is experiencing fibromyalgia flair and is interested in help being connected to a pain management provider. . Unable to independently secure referral or appointment for pain management provider. [name] Update 11/24/20:  Patient is being followed by Dr. Humphrey Rolls.  Nurse Case Manager Clinical Goal(s):  [name], patient will work with [facility] to address needs related to referral for pain management provider and associated care coordination needs. [name] Update 09/22/20:  patient is currently seeing Dr. Humphrey Rolls at [facility].  Interventions:  . Inter-disciplinary care team collaboration (see longitudinal plan of care) . Evaluation of current treatment plan related to fibromyalgia  and patient's adherence to plan as established by provider. [name] with primary care provider regarding recommendations and referral to pain management provider. . Discussed plans with patient for ongoing care management follow up and provided patient with direct contact information for care management team . Anticipate pain education program, pain management support as part of pain management referral.      . [name] contacted patient to check in. Patient stated she has not contacted an eye doctor nor has she seen a specialist. Patient states she thinks she has COVID due to her husband having it. She has not taken a test yet, but has some symptoms. She is isolating in the bedroom and they have someone to cook and bring her food.       [name] Update 11/16/20: [name] completed phone call with patient, she states she is still having symptoms from [place] and  has been in bed all month. Patient states she has not had a chance to contact an eye doctor or specialist due to being sick. [name] Update 11/24/20:  Patient with continued fatigue-will follow up with PCP. [name] Pharmacy referral for medication review. [name] Update 12/14/20: [name] completed call with patient, she stated she is waiting on a call from [facility] about getting a liver biopsy completed. She has not reached out to the eye doctor yet because her liver is top priority right now.  Patient Goals/Self-Care Activities Over the next 45 days, patient will:  -Attends all scheduled provider appointments Anticipate communication from PCP or pain management specialist office for new patient appointment.  develop a personal pain management  plan with your pain management provider when appointment arranged.  Schedule follow up appointment with PCP.  Follow Up Plan: RN Care Manager will collaborate with PCP regarding pain management referral. The Managed Medicaid care management team will reach out to the patient again over the next 30 days.                Follow up:  Patient agrees to Care Plan and Follow-up.  Plan: The Managed Medicaid care management team will reach out to the patient again over the next 30 days.  Date/time of next scheduled Social Work care management/care coordination outreach:  01/15/21  Mickel Fuchs, [place], [place]  High Risk Managed Medicaid Team

## 2020-12-14 NOTE — Patient Instructions (Signed)
Visit Information  Alison [name] was given information about Medicaid Managed Care team care coordination services as a part of their Healthy [facility] Medicaid benefit. Alison [name] verbally consented to engagement with the [facility] Managed Care team.   For questions related to your Healthy [facility] health plan, please call: [phone] or visit the homepage here: [email or web address]  If you would like to schedule transportation through your Healthy [facility] plan, please call the following number at least 2 days in advance of your appointment: [phone]  Alison [name] - following are the goals we discussed in your visit today:  Goals Addressed   None       Social Worker will follow up in 30 days.   Alison [name]  Following is a copy of your plan of care:  Patient Care Plan: Chronic Pain (Adult)    Problem Identified: Chronic Pain Management-fibromyalgia     Long-Range Goal: Fibromyalgia pain managed-new pain management provider   Start Date: 08/14/2020  Expected End Date: 02/21/2021  Recent Progress: On track  Priority: High  Note:   Current Barriers:  . Care Coordination needs related to pain management provider.   Patient is experiencing fibromyalgia flair and is interested in help being connected to a pain management provider. . Unable to independently secure referral or appointment for pain management provider. [name] Update 11/24/20:  Patient is being followed by Alison [name].  Nurse Case Manager Clinical Goal(s):  [name], patient will work with [facility] to address needs related to referral for pain management provider and associated care coordination needs. [name] Update 09/22/20:  patient is currently seeing Alison [name] at [facility].  Interventions:  . Inter-disciplinary care team collaboration (see longitudinal plan of care) . Evaluation of current treatment plan related to fibromyalgia  and patient's  adherence to plan as established by provider. Alison [name] with primary care provider regarding recommendations and referral to pain management provider. . Discussed plans with patient for ongoing care management follow up and provided patient with direct contact information for care management team . Anticipate pain education program, pain management support as part of pain management referral.      . [name] contacted patient to check in. Patient stated she has not contacted an eye doctor nor has she seen a specialist. Patient states she thinks she has COVID due to her husband having it. She has not taken a test yet, but has some symptoms. She is isolating in the bedroom and they have someone to cook and bring her food.       [name] Update 11/16/20: [name] completed phone call with patient, she states she is still having symptoms from [place] and has been in bed all month. Patient states she has not had a chance to contact an eye doctor or specialist due to being sick. [name] Update 11/24/20:  Patient with continued fatigue-will follow up with PCP. [name] Pharmacy referral for medication review. [name] Update 12/14/20: [name] completed call with patient, she stated she is waiting on a call from [facility] about getting a liver biopsy completed. She has not reached out to the eye doctor yet because her liver is top priority right now.  Patient Goals/Self-Care Activities Over the next 45 days, patient will:  -Attends all scheduled provider appointments Anticipate communication from PCP or pain management specialist office for new patient appointment.  develop a personal pain management plan with your pain management provider when appointment arranged.  Schedule follow up appointment with PCP.  Follow Up Plan: RN Care Manager will collaborate with PCP regarding pain management referral. The Managed Medicaid care management team will reach out to the patient again over the next 30 days.              Patient Care Plan: Wellness  (Adult)    Problem Identified: Medication Adherence (Wellness)     Goal: Medication Adherence Maintained   Start Date: 09/06/2020  Expected End Date: 12/05/2020  This Visit's Progress: Not on track  Priority: High  Note:   Current Barriers:  . Patient states she needs to have prescriptions refilled and feels she has some side effects from her medications.  Nurse Case Manager Clinical Goal(s):  [name] Over the next 30 days, patient will work with CM team pharmacist to review current medications.  Interventions:  . Inter-disciplinary care team collaboration (see longitudinal plan of care) . Evaluation of current treatment plan and patient's adherence to plan as established by provider. . Advised patient to contact her PCP for any medication needs. . Reviewed medications with patient. Alison [name] with pharmacy regarding medications.  . Discussed plans with patient for ongoing care management follow up and provided patient with direct contact information for care management team . Pharmacy referral for medication review  Patient Goals/Self-Care Activities Over the next 30 days, patient will:  -Patient will take medications as prescribed. RNCM will follow up with patient within 30 days and make referral to pharmacy. Calls pharmacy for medication refills Calls provider office for new concerns or questions  Follow Up Plan: The Managed Medicaid care management team will reach out to the patient again over the next 30 days.  The patient has been provided with contact information for the Managed Medicaid care management team and has been advised to call with any health related questions or concerns.

## 2020-12-18 ENCOUNTER — Other Ambulatory Visit: Payer: Self-pay

## 2020-12-18 ENCOUNTER — Encounter: Payer: Self-pay | Admitting: Family Medicine

## 2020-12-19 ENCOUNTER — Encounter: Payer: Self-pay | Admitting: Psychiatry

## 2020-12-19 ENCOUNTER — Other Ambulatory Visit: Payer: Self-pay

## 2020-12-19 ENCOUNTER — Telehealth (INDEPENDENT_AMBULATORY_CARE_PROVIDER_SITE_OTHER): Payer: Medicaid Other | Admitting: Psychiatry

## 2020-12-19 DIAGNOSIS — F3131 Bipolar disorder, current episode depressed, mild: Secondary | ICD-10-CM

## 2020-12-19 DIAGNOSIS — F4310 Post-traumatic stress disorder, unspecified: Secondary | ICD-10-CM

## 2020-12-19 DIAGNOSIS — G2111 Neuroleptic induced parkinsonism: Secondary | ICD-10-CM

## 2020-12-19 MED ORDER — DULOXETINE HCL 30 MG PO CPEP: 30.0000 mg | ORAL_CAPSULE | Freq: Every day | ORAL | 0 refills | Status: DC

## 2020-12-19 MED ORDER — PAROXETINE HCL 10 MG PO TABS: 10.0000 mg | ORAL_TABLET | Freq: Every day | ORAL | 1 refills | Status: DC

## 2020-12-19 NOTE — Progress Notes (Signed)
Virtual Visit via Video Note  I connected with Alison [name] on 12/19/20 at 11:40 AM EDT by a video enabled telemedicine application and verified that I am speaking with the correct person using two identifiers.  Location Provider Location : [facility] Patient Location : Home  Participants: Patient , Provider    I discussed the limitations of evaluation and management by telemedicine and the availability of in person appointments. The patient expressed understanding and agreed to proceed.    I discussed the assessment and treatment plan with the patient. The patient was provided an opportunity to ask questions and all were answered. The patient agreed with the plan and demonstrated an understanding of the instructions.   The patient was advised to call back or seek an in-person evaluation if the symptoms worsen or if the condition fails to improve as anticipated.   [facility] MD OP Progress Note  12/19/2020 12:11 PM Sherrina Zaugg  MRN:  [id number]  Chief Complaint:  Chief Complaint    Follow-up; Anxiety; Depression     HPI: Alison [name] is a 45 year old Caucasian female, lives in [place], has a history of bipolar disorder, PTSD, multiple medical problems including fibromyalgia, chronic pain, trigeminal neuralgia, history of liver transplant, B-cell lymphoma, SLE, diabetes melitis, OSA, migraine headaches was evaluated by telemedicine today.  Patient today reports she was recently advised by her liver transplant provider that she needs a liver biopsy since her liver enzymes are elevated.  She reports she is currently scheduled to get a biopsy.  Patient reports her mood symptoms as okay on the current medication regimen.  Reports she is managing her sadness and anxiety symptoms better than before.  She denies any significant panic attacks.  She does have a history of trauma and reports she continues to follow-up with her therapist.  Patient reports sleep is good.  She is compliant on  the medications except for the Lamictal.  She had a rash and decided to stop it.  Patient denies any suicidality, homicidality or perceptual disturbances.  Patient denies any other concerns today.  Visit Diagnosis:    ICD-10-CM   1. Bipolar 1 disorder, depressed, mild ([place])  F31.31   2. PTSD (post-traumatic stress disorder)  F43.10 PARoxetine (PAXIL) 10 MG tablet    DULoxetine (CYMBALTA) 30 MG capsule  3. 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 Abnormal uterine bleeding   . Allergy   . Anxiety   . Arthritis   . Bipolar disorder (manic depression) ([place])   . Chronic kidney failure   . Chronic renal disease, stage III ([place])   . Diabetes mellitus without complication ([place])   . 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  . FH: trigeminal neuralgia   . GERD (gastroesophageal reflux disease)   . Heart murmur   . Hypertension   . Kidney mass   . Lupus ([place])   . Lupus ([place])   . Major depressive disorder   . 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 Migraine   . Morbid obesity ([place])   . Neuromuscular disorder ([facility])    neuropathy  . Neuropathy   . Personality disorder ([place])   . Post herpetic neuralgia   . PTSD (post-traumatic stress disorder)   . Renal disorder   .  S/P liver transplant [facility])     Past Surgical History:  Procedure Laterality Date  . BONE MARROW BIOPSY  01/13/2015  . BREAST BIOPSY  12/2014  . BREAST BIOPSY  2011  . BREAST SURGERY    . CHOLECYSTECTOMY    . HERNIA REPAIR    . infusaport    . LIVER TRANSPLANT  12/17/1991  . LUMBAR PUNCTURE    . PORT A CATH INJECTION ([place] [facility])    . tumor removal  2015    Family Psychiatric History: I have reviewed family psychiatric history from my progress note on  06/10/2019  Family History:  Family History  Problem Relation Age of Onset  . Heart disease Mother   . Hypertension Mother   . Cancer - Other Mother   . Bipolar disorder Mother   . Heart disease Father   . Hypertension Father   . Diabetes Father   . Parkinson's disease Maternal Grandmother   . Cancer Maternal Aunt   . Cancer Maternal Uncle   . Cancer Maternal Grandfather   . Lupus Paternal Grandmother   . Hypertension Brother     Social History: I have reviewed social history from my progress note on 06/10/2019 Social History   Socioeconomic History  . Marital status: Single    Spouse name: Not on file  . Number of children: 0  . Years of education: 9  . Highest education level: GED or equivalent  Occupational History  . Occupation: disabled  Tobacco Use  . Smoking status: Never Smoker  . Smokeless tobacco: Never Used  Vaping Use  . Vaping Use: Never used  Substance and Sexual Activity  . Alcohol use: Not Currently  . Drug use: Not Currently    Types: Marijuana    Comment: pain managment last used in early april  . Sexual activity: Not Currently  Other Topics Concern  . Not on file  Social History Narrative  . Not on file   Social Determinants of Health   Financial Resource Strain: Not on file  Food Insecurity: Not on file  Transportation Needs: Not on file  Physical Activity: Not on file  Stress: Not on file  Social Connections: Not on file    Allergies:  Allergies  Allergen Reactions  . Penicillin G Itching, Rash, Anaphylaxis and Palpitations  . Lamictal [Lamotrigine] Rash  . Carbamazepine Other (See Comments)    Medication interaction-prograf  . Hydrocodone-Acetaminophen Itching  . Naproxen Itching    Metabolic Disorder Labs: Lab Results  Component Value Date   HGBA1C 8.9 (A) 12/11/2020   MPG 209 06/05/2020   Lab Results  Component Value Date   PROLACTIN 13.4 07/15/2019   PROLACTIN 21.3 05/24/2019   Lab Results  Component Value Date    CHOL 219 (H) 12/11/2020   TRIG 333 (H) 12/11/2020   HDL 38 (L) 12/11/2020   CHOLHDL 5.8 (H) 12/11/2020   LDLCALC 133 (H) 12/11/2020   LDLCALC 111 (H) 06/05/2020   Lab Results  Component Value Date   TSH 2.650 07/15/2019   TSH 4.650 (H) 05/24/2019    Therapeutic Level Labs: No results found for: LITHIUM No results found for: VALPROATE No components found for:  CBMZ  Current Medications: Current Outpatient Medications  Medication Sig Dispense Refill  . baclofen (LIORESAL) 20 MG tablet Take 1 tablet (20 mg total) by mouth 3 (three) times daily as needed for muscle spasms. 90 each 2  . benztropine (COGENTIN) 1 MG tablet Take 1 tablet (1 mg total) by mouth daily  as needed for tremors. 30 tablet 1  . Dulaglutide (TRULICITY) 9.67 EL/3.8BO SOPN Inject 0.75 mg into the skin once a week. 3 mL 3  . glucose blood (PRECISION QID TEST) test strip Use once daily Use as instructed.    . hydrOXYzine (VISTARIL) 25 MG capsule Take 1 capsule (25 mg total) by mouth 2 (two) times daily as needed. For sever anxiety attacks 60 capsule 1  . Lidocaine 0.5 % AERO Apply topically.    [name] lisinopril (ZESTRIL) 20 MG tablet Take 1 tablet (20 mg total) by mouth daily. 90 tablet 3  . magnesium oxide (MAG-OX) 400 MG tablet Take by mouth.    . Melatonin 5 MG CAPS Take 1-2 capsules (5-10 mg total) by mouth at bedtime. 100 capsule 0  . metFORMIN (GLUCOPHAGE) 1000 MG tablet Take 1 tablet (1,000 mg total) by mouth 2 (two) times daily with a meal. 180 tablet 3  . Multiple Vitamin (MULTI-VITAMIN) tablet Take 1 tablet by mouth daily.    [name] oxyCODONE ER 13.5 MG C12A Take 1 capsule by mouth 2 (two) times daily.    [name] PARoxetine (PAXIL) 10 MG tablet Take 1 tablet (10 mg total) by mouth daily. 30 tablet 1  . pregabalin (LYRICA) 200 MG capsule Take 1 capsule (200 mg total) by mouth 3 (three) times daily. 90 capsule 2  . promethazine (PHENERGAN) 25 MG tablet Take 1 tablet (25 mg total) by mouth as needed. 15 tablet 0  . QUEtiapine  (SEROQUEL XR) 300 MG 24 hr tablet Take 1 tablet (300 mg total) by mouth at bedtime. 90 tablet 0  . rosuvastatin (CRESTOR) 5 MG tablet Take 1 tablet (5 mg total) by mouth daily. 90 tablet 3  . SUMAtriptan (IMITREX) 100 MG tablet TAKE 1 TABLET BY MOUTH ONCE AS NEEDED FOR MIGRAINE. MAY TAKE A SECOND DOSE AFTER 2 HOURS IF NEEDED    . tacrolimus (PROGRAF) 1 MG capsule Take 1 capsule by mouth once daily 30 capsule 0  . vitamin B-12 (CYANOCOBALAMIN) 1000 MCG tablet Take 1,000 mcg by mouth daily. Takes 5000 mcg    . DULoxetine (CYMBALTA) 30 MG capsule Take 1 capsule (30 mg total) by mouth daily. Stop taking after a week 7 capsule 0   No current facility-administered medications for this visit.     Musculoskeletal: Strength & Muscle Tone: UTA Gait & Station: normal Patient leans: N/A  Psychiatric Specialty Exam: Review of Systems  Psychiatric/Behavioral: The patient is nervous/anxious.   All other systems reviewed and are negative.   Last menstrual period 02/04/2018.There is no height or weight on file to calculate BMI.  General Appearance: Casual  Eye Contact:  Fair  Speech:  Clear and Coherent  Volume:  Normal  Mood:  Anxious  Affect:  Congruent  Thought Process:  Goal Directed and Descriptions of Associations: Intact  Orientation:  Full (Time, Place, and Person)  Thought Content: Logical   Suicidal Thoughts:  No  Homicidal Thoughts:  No  Memory:  Immediate;   Fair Recent;   Fair Remote;   Fair  Judgement:  Fair  Insight:  Fair  Psychomotor Activity:  Normal  Concentration:  Concentration: Fair and Attention Span: Fair  Recall:  [employer] of Knowledge: Fair  Language: Fair  Akathisia:  No  Handed:  Right  AIMS (if indicated): UTA  Assets:  Communication Skills Desire for Improvement Housing Social Support  ADL's:  Intact  Cognition: WNL  Sleep:  Fair   Screenings: GAD-7   Personnel officer  Visit from 12/04/2020 in [facility]  Total GAD-7 Score  5    PHQ2-9   Flowsheet Row Video Visit from 12/19/2020 in [facility] Office Visit from 12/11/2020 in [facility] Office Visit from 12/04/2020 in [facility] Video Visit from 11/20/2020 in [facility] Office Visit from 10/17/2020 in [facility]  PHQ-2 Total Score _0 0 4  PHQ-9 Total Score -- 5 17 -- 10    Flowsheet Row Video Visit from 12/19/2020 in [facility] Video Visit from 11/20/2020 in [facility] ED from 11/03/2020 in [facility] CATEGORY Low Risk No Risk No Risk       Assessment and Plan: Jazz Biddy is a 45 year old Caucasian female on disability, has a history of bipolar disorder, PTSD, history of borderline personality disorder, chronic pain, fibromyalgia, trigeminal neuralgia, history of liver transplant, B-cell lymphoma, SLE, diabetes melitis, OSA, migraine headache was evaluated by telemedicine today.  Patient is currently struggling with elevated liver enzymes and has upcoming follow-up with her liver transplant specialist.  She will benefit from the following plan.  Plan Bipolar disorder depressed-improving Discontinue Lamictal for side effects and noncompliance Continue Seroquel extended release 300 mg p.o. nightly. Long-term plan will be to either reduce the dose or taper her off of the Seroquel due to her abnormal liver enzymes.  However will await liver biopsy results.  PTSD-stable Since she currently has elevated liver enzymes, patient agreeable to being tapered off of the Cymbalta. Start Cymbalta 30 mg p.o. daily for the next 7 days and stop taking it. Start Paxil to replace the Cymbalta.  Paxil 10 mg p.o. daily. Continue hydroxyzine 25 mg p.o. twice daily as needed for anxiety attacks Melatonin 5 to 10 mg p.o. nightly Continue CBT with her  therapist.  Neuroleptic induced Parkinson's disease-improving Cogentin 1 mg p.o. daily as needed.  She has been limiting use.   I have reviewed most recent CMP-dated 12/11/2020-BUN-elevated at 45, creatinine-elevated at 1.24, AST-elevated at 109, ALT-elevated at 34. Patient to sign a release to obtain medical records from her liver transplant specialist.  Will monitor closely and make further medication changes as needed.  Follow-up in clinic in 2 to 3 weeks or sooner if needed.  I have spent atleast 30 minutes with patient today which includes the time spent for preparing to see the patient ( e.g., review of test, records ), obtaining and to review and separately obtained history , ordering medications and test ,psychoeducation and supportive psychotherapy and care coordination,as well as documenting clinical information in electronic health record,interpreting and communication of test results   This note was generated in part or whole with voice recognition software. Voice recognition is usually quite accurate but there are transcription errors that can and very often do occur. I apologize for any typographical errors that were not detected and corrected.       [name], MD 12/20/2020, 9:12 AM

## 2020-12-19 NOTE — Patient Instructions (Signed)
Paroxetine Tablets What is this medicine? PAROXETINE (pa ROX e teen) is used to treat depression. It may also be used to treat anxiety disorders, obsessive compulsive disorder, panic attacks, post traumatic stress, and premenstrual dysphoric disorder (PMDD). This medicine may be used for other purposes; ask your health care provider or pharmacist if you have questions. COMMON BRAND NAME(S): Paxil, Pexeva What should I tell my health care provider before I take this medicine? They need to know if you have any of these conditions:  bipolar disorder or a family history of bipolar disorder  bleeding disorders  glaucoma  heart disease  kidney disease  liver disease  low levels of sodium in the blood  seizures  suicidal thoughts, plans, or attempt; a previous suicide attempt by you or a family member  take MAOIs like Carbex, Eldepryl, Marplan, Nardil, and Parnate  take medicines that treat or prevent blood clots  thyroid disease  an unusual or allergic reaction to paroxetine, other medicines, foods, dyes, or preservatives  pregnant or trying to get pregnant  breast-feeding How should I use this medicine? Take this medicine by mouth with a glass of water. Follow the directions on the prescription label. You can take it with or without food. Take your medicine at regular intervals. Do not take your medicine more often than directed. Do not stop taking this medicine suddenly except upon the advice of your doctor. Stopping this medicine too quickly may cause serious side effects or your condition may worsen. A special MedGuide will be given to you by the pharmacist with each prescription and refill. Be sure to read this information carefully each time. Talk to your pediatrician regarding the use of this medicine in children. Special care may be needed. Overdosage: If you think you have taken too much of this medicine contact a poison control center or emergency room at once. NOTE:  This medicine is only for you. Do not share this medicine with others. What if I miss a dose? If you miss a dose, take it as soon as you can. If it is almost time for your next dose, take only that dose. Do not take double or extra doses. What may interact with this medicine? Do not take this medicine with any of the following medications:  linezolid  MAOIs like Carbex, Eldepryl, Marplan, Nardil, and Parnate  methylene blue (injected into a vein)  pimozide  thioridazine This medicine may also interact with the following medications:  alcohol  amphetamines  aspirin and aspirin-like medicines  atomoxetine  certain medicines for depression, anxiety, or psychotic disturbances  certain medicines for irregular heart beat like propafenone, flecainide, encainide, and quinidine  certain medicines for migraine headache like almotriptan, eletriptan, frovatriptan, naratriptan, rizatriptan, sumatriptan, zolmitriptan  cimetidine  digoxin  diuretics  fentanyl  fosamprenavir  furazolidone  isoniazid  lithium  medicines that treat or prevent blood clots like warfarin, enoxaparin, and dalteparin  medicines for sleep  NSAIDs, medicines for pain and inflammation, like ibuprofen or naproxen  phenobarbital  phenytoin  procarbazine  rasagiline  ritonavir  supplements like St. John's wort, kava kava, valerian  tamoxifen  tramadol  tryptophan This list may not describe all possible interactions. Give your health care provider a list of all the medicines, herbs, non-prescription drugs, or dietary supplements you use. Also tell them if you smoke, drink alcohol, or use illegal drugs. Some items may interact with your medicine. What should I watch for while using this medicine? Tell your doctor if your symptoms do not  get better or if they get worse. Visit your doctor or health care professional for regular checks on your progress. Because it may take several weeks to see  the full effects of this medicine, it is important to continue your treatment as prescribed by your doctor. Patients and their families should watch out for new or worsening thoughts of suicide or depression. Also watch out for sudden changes in feelings such as feeling anxious, agitated, panicky, irritable, hostile, aggressive, impulsive, severely restless, overly excited and hyperactive, or not being able to sleep. If this happens, especially at the beginning of treatment or after a change in dose, call your health care professional. Dennis Bast may get drowsy or dizzy. Do not drive, use machinery, or do anything that needs mental alertness until you know how this medicine affects you. Do not stand or sit up quickly, especially if you are an older patient. This reduces the risk of dizzy or fainting spells. Alcohol may interfere with the effect of this medicine. Avoid alcoholic drinks. Your mouth may get dry. Chewing sugarless gum or sucking hard candy, and drinking plenty of water will help. Contact your doctor if the problem does not go away or is severe. What side effects may I notice from receiving this medicine? Side effects that you should report to your doctor or health care professional as soon as possible:  allergic reactions like skin rash, itching or hives, swelling of the face, lips, or tongue  anxious  black, tarry stools  changes in vision  confusion  elevated mood, decreased need for sleep, racing thoughts, impulsive behavior  eye pain  fast, irregular heartbeat  feeling faint or lightheaded, falls  feeling agitated, angry, or irritable  hallucination, loss of contact with reality  loss of balance or coordination  loss of memory  painful or prolonged erections  restlessness, pacing, inability to keep still  seizures  stiff muscles  suicidal thoughts or other mood changes  trouble sleeping  unusual bleeding or bruising  unusually weak or tired  vomiting Side  effects that usually do not require medical attention (report to your doctor or health care professional if they continue or are bothersome):  change in appetite or weight  change in sex drive or performance  diarrhea  dizziness  dry mouth  increased sweating  indigestion, nausea  tired  tremors This list may not describe all possible side effects. Call your doctor for medical advice about side effects. You may report side effects to FDA at 1-800-FDA-1088. Where should I keep my medicine? Keep out of the reach of children. Store at room temperature between 15 and 30 degrees C (59 and 86 degrees F). Keep container tightly closed. Throw away any unused medicine after the expiration date. NOTE: This sheet is a summary. It may not cover all possible information. If you have questions about this medicine, talk to your doctor, pharmacist, or health care provider.  2021 Elsevier/Gold Standard (2020-08-14 14:25:05)

## 2020-12-20 DIAGNOSIS — Z944 Liver transplant status: Secondary | ICD-10-CM | POA: Diagnosis not present

## 2020-12-20 DIAGNOSIS — F1129 Opioid dependence with unspecified opioid-induced disorder: Secondary | ICD-10-CM | POA: Insufficient documentation

## 2020-12-20 DIAGNOSIS — K7581 Nonalcoholic steatohepatitis (NASH): Secondary | ICD-10-CM | POA: Diagnosis not present

## 2020-12-20 DIAGNOSIS — D47Z1 Post-transplant lymphoproliferative disorder (PTLD): Secondary | ICD-10-CM | POA: Insufficient documentation

## 2020-12-20 DIAGNOSIS — R7989 Other specified abnormal findings of blood chemistry: Secondary | ICD-10-CM | POA: Diagnosis not present

## 2020-12-22 ENCOUNTER — Other Ambulatory Visit: Payer: Self-pay

## 2020-12-22 ENCOUNTER — Encounter: Payer: Self-pay | Admitting: Physician Assistant

## 2020-12-22 ENCOUNTER — Inpatient Hospital Stay: Payer: Medicaid Other | Admitting: Oncology

## 2020-12-22 ENCOUNTER — Inpatient Hospital Stay: Payer: Medicaid Other | Attending: Oncology

## 2020-12-22 ENCOUNTER — Ambulatory Visit (INDEPENDENT_AMBULATORY_CARE_PROVIDER_SITE_OTHER): Payer: Medicaid Other | Admitting: Physician Assistant

## 2020-12-22 ENCOUNTER — Other Ambulatory Visit: Payer: Self-pay | Admitting: Obstetrics and Gynecology

## 2020-12-22 VITALS — BP 151/90 | HR 76 | Ht 67.0 in | Wt 266.0 lb

## 2020-12-22 VITALS — BP 145/89 | HR 71 | Temp 97.2°F | Wt 267.0 lb

## 2020-12-22 DIAGNOSIS — N183 Chronic kidney disease, stage 3 unspecified: Secondary | ICD-10-CM | POA: Diagnosis not present

## 2020-12-22 DIAGNOSIS — R5381 Other malaise: Secondary | ICD-10-CM | POA: Diagnosis not present

## 2020-12-22 DIAGNOSIS — N3281 Overactive bladder: Secondary | ICD-10-CM | POA: Diagnosis not present

## 2020-12-22 DIAGNOSIS — Z08 Encounter for follow-up examination after completed treatment for malignant neoplasm: Secondary | ICD-10-CM

## 2020-12-22 DIAGNOSIS — Z79899 Other long term (current) drug therapy: Secondary | ICD-10-CM | POA: Diagnosis not present

## 2020-12-22 DIAGNOSIS — N390 Urinary tract infection, site not specified: Secondary | ICD-10-CM | POA: Diagnosis not present

## 2020-12-22 DIAGNOSIS — Z8572 Personal history of non-Hodgkin lymphomas: Secondary | ICD-10-CM | POA: Insufficient documentation

## 2020-12-22 DIAGNOSIS — Z944 Liver transplant status: Secondary | ICD-10-CM | POA: Insufficient documentation

## 2020-12-22 DIAGNOSIS — N2889 Other specified disorders of kidney and ureter: Secondary | ICD-10-CM | POA: Diagnosis not present

## 2020-12-22 DIAGNOSIS — Z7984 Long term (current) use of oral hypoglycemic drugs: Secondary | ICD-10-CM | POA: Insufficient documentation

## 2020-12-22 DIAGNOSIS — R7989 Other specified abnormal findings of blood chemistry: Secondary | ICD-10-CM | POA: Insufficient documentation

## 2020-12-22 DIAGNOSIS — I129 Hypertensive chronic kidney disease with stage 1 through stage 4 chronic kidney disease, or unspecified chronic kidney disease: Secondary | ICD-10-CM | POA: Diagnosis not present

## 2020-12-22 DIAGNOSIS — E1122 Type 2 diabetes mellitus with diabetic chronic kidney disease: Secondary | ICD-10-CM | POA: Insufficient documentation

## 2020-12-22 DIAGNOSIS — R5383 Other fatigue: Secondary | ICD-10-CM | POA: Diagnosis not present

## 2020-12-22 DIAGNOSIS — M3214 Glomerular disease in systemic lupus erythematosus: Secondary | ICD-10-CM | POA: Insufficient documentation

## 2020-12-22 LAB — CBC WITH DIFFERENTIAL/PLATELET
Abs Immature Granulocytes: 0.01 10*3/uL (ref 0.00–0.07)
Basophils Absolute: 0 10*3/uL (ref 0.0–0.1)
Basophils Relative: 1 %
Eosinophils Absolute: 0.2 10*3/uL (ref 0.0–0.5)
Eosinophils Relative: 5 %
HCT: 39.4 % (ref 36.0–46.0)
Hemoglobin: 13.2 g/dL (ref 12.0–15.0)
Immature Granulocytes: 0 %
Lymphocytes Relative: 31 %
Lymphs Abs: 1.1 10*3/uL (ref 0.7–4.0)
MCH: 29.9 pg (ref 26.0–34.0)
MCHC: 33.5 g/dL (ref 30.0–36.0)
MCV: 89.3 fL (ref 80.0–100.0)
Monocytes Absolute: 0.3 10*3/uL (ref 0.1–1.0)
Monocytes Relative: 8 %
Neutro Abs: 1.9 10*3/uL (ref 1.7–7.7)
Neutrophils Relative %: 55 %
Platelets: 105 10*3/uL — ABNORMAL LOW (ref 150–400)
RBC: 4.41 MIL/uL (ref 3.87–5.11)
RDW: 13.2 % (ref 11.5–15.5)
WBC: 3.5 10*3/uL — ABNORMAL LOW (ref 4.0–10.5)
nRBC: 0 % (ref 0.0–0.2)

## 2020-12-22 LAB — URINALYSIS, COMPLETE
Bilirubin, UA: NEGATIVE
Ketones, UA: NEGATIVE
Leukocytes,UA: NEGATIVE
Nitrite, UA: NEGATIVE
RBC, UA: NEGATIVE
Specific Gravity, UA: 1.02 (ref 1.005–1.030)
Urobilinogen, Ur: 0.2 mg/dL (ref 0.2–1.0)
pH, UA: 7 (ref 5.0–7.5)

## 2020-12-22 LAB — COMPREHENSIVE METABOLIC PANEL
ALT: 51 U/L — ABNORMAL HIGH (ref 0–44)
AST: 68 U/L — ABNORMAL HIGH (ref 15–41)
Albumin: 3.7 g/dL (ref 3.5–5.0)
Alkaline Phosphatase: 66 U/L (ref 38–126)
Anion gap: 11 (ref 5–15)
BUN: 17 mg/dL (ref 6–20)
CO2: 24 mmol/L (ref 22–32)
Calcium: 9 mg/dL (ref 8.9–10.3)
Chloride: 100 mmol/L (ref 98–111)
Creatinine, Ser: 1.05 mg/dL — ABNORMAL HIGH (ref 0.44–1.00)
GFR, Estimated: >60 mL/min (ref >60)
Glucose, Bld: 398 mg/dL — ABNORMAL HIGH (ref 70–99)
Potassium: 4.4 mmol/L (ref 3.5–5.1)
Sodium: 135 mmol/L (ref 135–145)
Total Bilirubin: 0.7 mg/dL (ref 0.3–1.2)
Total Protein: 7.4 g/dL (ref 6.5–8.1)

## 2020-12-22 LAB — MICROSCOPIC EXAMINATION: Epithelial Cells (non renal): >10 /hpf — AB (ref 0–10)

## 2020-12-22 LAB — LACTATE DEHYDROGENASE: LDH: 138 U/L (ref 98–192)

## 2020-12-22 MED ORDER — MIRABEGRON ER 50 MG PO TB24: 50.0000 mg | ORAL_TABLET | Freq: Every day | ORAL | 3 refills | Status: DC

## 2020-12-22 NOTE — Progress Notes (Signed)
12/22/2020 2:46 PM   Alison [name] 01-07-1976 [id number]  CC: Chief Complaint  Patient presents with  . Recurrent UTI    HPI: Alison [name] is a 45 y.o. female with PMH renal mass, recurrent UTI, and OAB who previously responded well to Myrbetriq 50 mg daily who presents today for evaluation of possible UTI.   She was seen by her PCP 11 days ago with reports of dysuria, increased urgency, and malodorous urine x2 weeks.  At that time, she had already been prescribed a course of Macrobid by urgent care but noted her urinary symptoms returned within 2 days of completing this medication.  At that time, she was switched to Cipro 500 mg daily x3 days.  Urine culture ultimately resulted with non-uropathogenic gram-positive organisms likely representing urinary colonizers.  Today she reports ongoing urgency, leakage, and dysuria.  She states the dysuria has slightly improved after completing both courses of antibiotics, however they have not resolved entirely.  Patient reports a recent Covid infection 2 months ago.  She is still struggling with hypoxemia.  She reports having stopped Myrbetriq when her insurance would not fill it.  She denies a history of gross hematuria or smoking.  She does have a history of Sjogren's syndrome.  No recent cross-sectional imaging for monitoring of her renal mass.  In-office UA today positive for 2+ glucose and trace protein; urine microscopy with >10 epithelial cells/hpf and moderate bacteria.  PMH: Past Medical History:  Diagnosis Date  . Abnormal uterine bleeding   . Allergy   . Anxiety   . Arthritis   . Bipolar disorder (manic depression) ([place])   . Chronic kidney failure   . Chronic renal disease, stage III ([place])   . Diabetes mellitus without complication ([place])   . 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  . FH: trigeminal neuralgia   . GERD (gastroesophageal reflux disease)   .  Heart murmur   . Hypertension   . Kidney mass   . Lupus ([place])   . Lupus ([place])   . Major depressive disorder   . 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 Migraine   . Morbid obesity ([place])   . Neuromuscular disorder (HCC)    neuropathy  . Neuropathy   . Personality disorder ([place])   . Post herpetic neuralgia   . PTSD (post-traumatic stress disorder)   . Renal disorder   . S/P liver transplant [facility])     Surgical History: Past Surgical History:  Procedure Laterality Date  . BONE MARROW BIOPSY  01/13/2015  . BREAST BIOPSY  12/2014  . BREAST BIOPSY  2011  . BREAST SURGERY    . CHOLECYSTECTOMY    . HERNIA REPAIR    . infusaport    . LIVER TRANSPLANT  12/17/1991  . LUMBAR PUNCTURE    . PORT A CATH INJECTION ([facility])    . tumor removal  2015    Home Medications:  Allergies as of 12/22/2020      Reactions   Penicillin G Itching, Rash, Anaphylaxis, Palpitations   Lamictal [lamotrigine] Rash   Carbamazepine Other (See Comments)   Medication interaction-prograf   Hydrocodone-acetaminophen Itching   Naproxen Itching      Medication List       Accurate as of December 22, 2020  2:46 PM. If you have any questions, ask your nurse or doctor.        STOP  taking these medications   hydrOXYzine 25 MG capsule Commonly known as: Vistaril Stopped by: Sindy Guadeloupe, MD   lisinopril 20 MG tablet Commonly known as: ZESTRIL Stopped by: Sindy Guadeloupe, MD   vitamin B-12 1000 MCG tablet Commonly known as: CYANOCOBALAMIN Stopped by: Sindy Guadeloupe, MD     TAKE these medications   baclofen 20 MG tablet Commonly known as: LIORESAL Take 1 tablet (20 mg total) by mouth 3 (three) times daily as needed for muscle spasms.   benztropine 1 MG tablet Commonly known as: COGENTIN Take 1 tablet (1 mg total) by mouth daily as needed for tremors.   DULoxetine 30 MG capsule Commonly known as: Cymbalta Take 1 capsule (30 mg total) by mouth daily. Stop taking after a week    Lidocaine 0.5 % Aero Apply topically.   magnesium oxide 400 MG tablet Commonly known as: MAG-OX Take by mouth.   Melatonin 5 MG Caps Take 1-2 capsules (5-10 mg total) by mouth at bedtime.   metFORMIN 1000 MG tablet Commonly known as: GLUCOPHAGE Take 1 tablet (1,000 mg total) by mouth 2 (two) times daily with a meal.   Multi-Vitamin tablet Take 1 tablet by mouth daily.   oxyCODONE ER 13.5 MG C12a Take 1 capsule by mouth 2 (two) times daily.   PARoxetine 10 MG tablet Commonly known as: Paxil Take 1 tablet (10 mg total) by mouth daily.   Precision QID Test test strip Generic drug: glucose blood Use once daily Use as instructed.   pregabalin 200 MG capsule Commonly known as: LYRICA Take 1 capsule (200 mg total) by mouth 3 (three) times daily.   promethazine 25 MG tablet Commonly known as: PHENERGAN Take 1 tablet (25 mg total) by mouth as needed.   QUEtiapine 300 MG 24 hr tablet Commonly known as: SEROquel XR Take 1 tablet (300 mg total) by mouth at bedtime.   rosuvastatin 5 MG tablet Commonly known as: Crestor Take 1 tablet (5 mg total) by mouth daily.   SUMAtriptan 100 MG tablet Commonly known as: IMITREX TAKE 1 TABLET BY MOUTH ONCE AS NEEDED FOR MIGRAINE. MAY TAKE A SECOND DOSE AFTER 2 HOURS IF NEEDED   tacrolimus 1 MG capsule Commonly known as: PROGRAF Take 1 capsule by mouth once daily   Trulicity 4.09 BD/5.3GD Sopn Generic drug: Dulaglutide Inject 0.75 mg into the skin once a week.       Allergies:  Allergies  Allergen Reactions  . Penicillin G Itching, Rash, Anaphylaxis and Palpitations  . Lamictal [Lamotrigine] Rash  . Carbamazepine Other (See Comments)    Medication interaction-prograf  . Hydrocodone-Acetaminophen Itching  . Naproxen Itching    Family History: Family History  Problem Relation Age of Onset  . Heart disease Mother   . Hypertension Mother   . Cancer - Other Mother   . Bipolar disorder Mother   . Heart disease Father   .  Hypertension Father   . Diabetes Father   . Parkinson's disease Maternal Grandmother   . Cancer Maternal Aunt   . Cancer Maternal Uncle   . Cancer Maternal Grandfather   . Lupus Paternal Grandmother   . Hypertension Brother     Social History:   reports that she has never smoked. She has never used smokeless tobacco. She reports previous alcohol use. She reports previous drug use. Drug: Marijuana.  Physical Exam: BP (!) 151/90   Pulse 76   Ht _0  (1.702 m)   Wt 266 lb (120.7 kg)  LMP 02/04/2018 (Approximate) Comment: Per patient, over a year ago  BMI 41.66 kg/m   Constitutional:  Alert and oriented, no acute distress, nontoxic appearing HEENT: [place], AT Cardiovascular: No clubbing, cyanosis, or edema Respiratory: Normal respiratory effort, no increased work of breathing Skin: No rashes, bruises or suspicious lesions Neurologic: Grossly intact, no focal deficits, moving all 4 extremities Psychiatric: Normal mood and affect  Laboratory Data: Results for orders placed or performed in visit on 12/22/20  Microscopic Examination   Urine  Result Value Ref Range   WBC, UA 0-5 0 - 5 /hpf   RBC 0-2 0 - 2 /hpf   Epithelial Cells (non renal) >10 (A) 0 - 10 /hpf   Bacteria, UA Moderate (A) None seen/Few  Urinalysis, Complete  Result Value Ref Range   Specific Gravity, UA 1.020 1.005 - 1.030   pH, UA 7.0 5.0 - 7.5   Color, UA Yellow Yellow   Appearance Ur Cloudy (A) Clear   Leukocytes,UA Negative Negative   Protein,UA Trace (A) Negative/Trace   Glucose, UA 2+ (A) Negative   Ketones, UA Negative Negative   RBC, UA Negative Negative   Bilirubin, UA Negative Negative   Urobilinogen, Ur 0.2 0.2 - 1.0 mg/dL   Nitrite, UA Negative Negative   Microscopic Examination See below:    Assessment & Plan:   1. Recurrent UTI UA benign today with some persistent dysuria. I recommend cystoscopy for further evaluation at this time. - Urinalysis, Complete  2. Overactive bladder Will  restart Myrbetriq at this time given prior therapeutic relief on this med. Notably, anticholinergics are contraindicated in this patient with a history of dry mouth and dry eye 2/2 Sjogren's syndrome. - mirabegron ER (MYRBETRIQ) 50 MG TB24 tablet; Take 1 tablet (50 mg total) by mouth daily.  Dispense: 90 tablet; Refill: 3  3. Renal mass No recent surveillance imaging of her renal mass; recommend CTU at this time for further evaluation. She is in agreement with this plan. - CT HEMATURIA WORKUP; Future   Return in about 3 weeks (around 01/12/2021) for Cysto and CTU results with Dr. Erlene Quan.  Debroah Loop, PA-C  [facility] [address], [place],  [address] [phone]

## 2020-12-22 NOTE — Patient Outreach (Signed)
Medicaid Managed Care   Nurse Care Manager Note  12/22/2020 Name:  Alison [name] MRN:  [id number] DOB:  1976/05/05  Alison [name] is an 45 y.o. year old female who is a primary patient of [facility].  The [facility] Managed Care Coordination team was consulted for assistance with:    chronic healthcare management needs.  Ms. Ponti was given information about Medicaid Managed Care Coordination team services today. Thi Klich agreed to services and verbal consent obtained.  Engaged with patient by telephone for follow up visit in response to provider referral for case management and/or care coordination services.   Assessments/Interventions:  Review of past medical history, allergies, medications, health status, including review of consultants reports, laboratory and other test data, was performed as part of comprehensive evaluation and provision of chronic care management services.  SDOH (Social Determinants of Health) assessments and interventions performed:   Care Plan  Allergies  Allergen Reactions  . Penicillin G Itching, Rash, Anaphylaxis and Palpitations  . Lamictal [Lamotrigine] Rash  . Carbamazepine Other (See Comments)    Medication interaction-prograf  . Hydrocodone-Acetaminophen Itching  . Naproxen Itching    Medications Reviewed Today    Reviewed by [name], RN (Registered Nurse) on 12/22/20 at 1032  Med List Status: <None>  Medication Order Taking? Sig Documenting Provider Last Dose Status Informant  baclofen (LIORESAL) 20 MG tablet [id number] No Take 1 tablet (20 mg total) by mouth 3 (three) times daily as needed for muscle spasms. [name], FNP Taking Active   benztropine (COGENTIN) 1 MG tablet [id number] No Take 1 tablet (1 mg total) by mouth daily as needed for tremors. [name], MD Taking Active   Dulaglutide (TRULICITY) 1.57 WI/2.0BT SOPN [id number] No Inject 0.75 mg into the skin once a week. Myles Gip, DO  Taking Active   DULoxetine (CYMBALTA) 30 MG capsule [id number]  Take 1 capsule (30 mg total) by mouth daily. Stop taking after a week [name], [name], MD  Active   glucose blood (PRECISION QID TEST) test strip [id number] No Use once daily Use as instructed. [provider] Taking Active   hydrOXYzine (VISTARIL) 25 MG capsule [id number] No Take 1 capsule (25 mg total) by mouth 2 (two) times daily as needed. For sever anxiety attacks [name], MD Taking Active            Med Note [name] Sep 11, 2020 10:50 AM) PRN  Lidocaine 0.5 % AERO [id number] No Apply topically. [provider] Taking Active   lisinopril (ZESTRIL) 20 MG tablet [id number] No Take 1 tablet (20 mg total) by mouth daily. Towanda Malkin, MD Taking Active            Med Note [name] Sep 11, 2020 10:47 AM) AM  magnesium oxide (MAG-OX) 400 MG tablet [id number] No Take by mouth. [provider] Taking Active   Melatonin 5 MG CAPS [id number] No Take 1-2 capsules (5-10 mg total) by mouth at bedtime. [name], MD Taking Active   metFORMIN (GLUCOPHAGE) 1000 MG tablet [id number] No Take 1 tablet (1,000 mg total) by mouth 2 (two) times daily with a meal. Myles Gip, DO Taking Active   Multiple Vitamin (MULTI-VITAMIN) tablet [id number] No Take 1 tablet by mouth daily. [provider] Taking Active   oxyCODONE ER 13.5 MG C12A [id number] No Take 1 capsule by mouth 2 (two) times daily. [provider] Taking Active   PARoxetine (PAXIL)  10 MG tablet [id number]  Take 1 tablet (10 mg total) by mouth daily. [name], MD  Active   pregabalin (LYRICA) 200 MG capsule [id number] No Take 1 capsule (200 mg total) by mouth 3 (three) times daily. [name], MD Taking Expired 06/06/20 2359            Med Note (Floretta Petro, [name]   Wed Sep 06, 2020  2:51 PM) Taking  promethazine (PHENERGAN) 25 MG tablet [id number] No Take 1 tablet (25 mg total) by mouth as  needed. Myles Gip, DO Taking Active   QUEtiapine (SEROQUEL XR) 300 MG 24 hr tablet [id number] No Take 1 tablet (300 mg total) by mouth at bedtime. [name], MD Taking Active   rosuvastatin (CRESTOR) 5 MG tablet [id number] No Take 1 tablet (5 mg total) by mouth daily. Myles Gip, DO Taking Active   SUMAtriptan (IMITREX) 100 MG tablet [id number] No TAKE 1 TABLET BY MOUTH ONCE AS NEEDED FOR MIGRAINE. MAY TAKE A SECOND DOSE AFTER 2 HOURS IF NEEDED [provider] Taking Active   tacrolimus (PROGRAF) 1 MG capsule [id number] No Take 1 capsule by mouth once daily [name], FNP Taking Active            Med Note [name] Sep 11, 2020 10:48 AM) AM  vitamin B-12 (CYANOCOBALAMIN) 1000 MCG tablet [id number] No Take 1,000 mcg by mouth daily. Takes 5000 mcg [provider] Taking Active           Patient Active Problem List   Diagnosis Date Noted  . Opioid dependence with opioid-induced disorder ([place]) 12/20/2020  . 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 Bipolar disorder, in full remission, most recent episode depressed ([place]) 09/27/2020  . Suprapubic pain 07/25/2020  . Bipolar 1 disorder, depressed, mild ([place]) 07/10/2020  . 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 Edema of lower extremity 09/16/2019  . Carpal tunnel syndrome, left 07/26/2019  . Cubital tunnel syndrome on left 07/26/2019  . Bipolar I disorder, most recent episode depressed ([place]) 07/12/2019  . PTSD (post-traumatic stress disorder) 07/12/2019  . Umbilical hernia without obstruction and without gangrene 06/17/2019  . Encounter for surveillance of abnormal nevi 06/17/2019  . Pineal gland cyst 06/17/2019  . Chronic hip pain, left 06/02/2019  . Lumbar spondylosis 06/02/2019  . Mouth dryness 06/02/2019  . Obesity (BMI 35.0-39.9 without comorbidity) 06/02/2019  . Goals of care, counseling/discussion 05/18/2019  . 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 Occipital neuralgia 05/13/2019  . Plantar fasciitis of left foot 04/27/2019  . Fibromyalgia 03/23/2019  . Systemic lupus erythematosus (SLE) in adult ([place]) 03/23/2019  . Essential hypertension 03/23/2019  . Hepatitis 03/23/2019  . Mass of left kidney 03/23/2019  . Diabetes mellitus ([place]) 03/23/2019  . Nausea without vomiting 03/23/2019  . Neuropathy 03/23/2019  . Cancer ([place]) 03/23/2019  . Hyperlipidemia 03/23/2019  . Osteoarthritis 03/23/2019  . Trigeminal neuralgia 03/23/2019  . Chronic renal disease, stage III ([place]) 03/23/2019  . Nephrolithiasis 03/23/2019  . Migraine 03/23/2019  . OSA on CPAP 03/23/2019  . Fibrocystic breast 03/23/2019  . Heart murmur 03/23/2019  . Bipolar disorder, in partial remission, most recent episode depressed ([place]) 07/02/2017  . Abnormal uterine bleeding 03/18/2017  . Major depressive disorder, recurrent ([place]) 03/18/2017  . Morbid obesity ([place]) 03/18/2017  . Secondary hyperparathyroidism ([place]) 02/07/2017  . Neuropathic pain 11/19/2016  . Post herpetic  neuralgia 11/19/2016  . 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[place]) 01/13/2015  . Lumbar disc disease with radiculopathy 07/26/2013  . S/P liver transplant ([place]) 07/26/2013  . Type 2 diabetes mellitus, uncontrolled ([place]) 07/26/2013  . Facial nerve disorder 06/29/2012  . Low back pain 12/26/2010    Conditions to be addressed/monitored per PCP order:  chronic healthcare management needs,bipolat, PTSD, chronic pain, DM2, HTN, fibromyalgia, h/o liver transplant, post transplant lymphoma  Care Plan : Chronic Pain (Adult)  Updates made by [name], RN since 12/22/2020 12:00 AM    Problem: Chronic Pain Management-fibromyalgia     Long-Range Goal: Fibromyalgia pain managed-new pain management provider   Start Date: 08/14/2020  Expected End Date: 02/21/2021  Recent  Progress: On track  Priority: High  Note:   Current Barriers:  . Care Coordination needs related to pain management provider.   Patient is experiencing fibromyalgia flair and is interested in help being connected to a pain management provider. . Unable to independently secure referral or appointment for pain management provider. [name] Update 11/24/20:  Patient is being followed by Dr. Humphrey Rolls.  Nurse Case Manager Clinical Goal(s):  [name], patient will work with [facility] to address needs related to referral for pain management provider and associated care coordination needs. [name] Update 09/22/20:  patient is currently seeing Dr. Humphrey Rolls at [facility].  Interventions:  . Inter-disciplinary care team collaboration (see longitudinal plan of care) . Evaluation of current treatment plan related to fibromyalgia  and patient's adherence to plan as established by provider. [name] with primary care provider regarding recommendations and referral to pain management provider. . Discussed plans with patient for ongoing care management follow up and provided patient with direct contact information for care management team . Anticipate pain education program, pain management support as part of pain management referral.      . [name] contacted patient to check in. Patient stated she has not contacted an eye doctor nor has she seen a specialist. Patient states she thinks she has COVID due to her husband having it. She has not taken a test yet, but has some symptoms. She is isolating in the bedroom and they have someone to cook and bring her food.       [name] Update 11/16/20: [name] completed phone call with patient, she states she is still having symptoms from [place] and has been in bed all month. Patient states she has not had a chance to contact an eye doctor or specialist due to being sick. [name] Update 11/24/20:  Patient with continued fatigue-will follow up with PCP. [name] Pharmacy referral for  medication review. [name] Update 12/14/20: [name] completed call with patient, she stated she is waiting on a call from [facility] about getting a liver biopsy completed. She has not reached out to the eye doctor yet because her liver is top priority right now. [name] Update 12/22/20:  Patient seen by PCP 12/11/20.  Has not seen Opthamologist yet.  Had liver biopsy 12/20/20 due to increased liver enzymes.   Patient Goals/Self-Care Activities Over the next 45 days, patient will:  -Attends all scheduled provider appointments  develop a personal pain management plan with your pain management provider.  Follow Up Plan:  The Managed Medicaid care management team will reach out to the patient again over the next 30 days.    Care Plan : Wellness (Adult)  Updates made by [name], RN since 12/22/2020 12:00 AM  Problem: Medication Adherence (Wellness)     Goal: Medication Adherence Maintained   Start Date: 09/06/2020  Expected End Date: 12/05/2020  Recent Progress: Not on track  Priority: High  Note:   Current Barriers:  . Patient states she needs to have prescriptions refilled and feels she has some side effects from her medications. [name] Update 12/22/20:  Patient's PCP discontinued Victoza and added Trulicity.  Patient has not been taking mediations as prescribed.  Hgb A1C 8.1 on 12/11/20.  Nurse Case Manager Clinical Goal(s):  [name] Over the next 30 days, patient will work with CM team pharmacist to review current medications.  Interventions:  . Inter-disciplinary care team collaboration (see longitudinal plan of care) . Evaluation of current treatment plan and patient's adherence to plan as established by provider. . Advised patient to contact her PCP for any medication needs. . Reviewed medications with patient. [name] with pharmacy regarding medications.  . Discussed plans with patient for ongoing care management follow up and provided patient with direct contact information for care management  team . Pharmacy referral for medication review . Update 12/22/20:  Patient has an appointment with Pharmacist 12/25/20.  Patient Goals/Self-Care Activities Over the next 30 days, patient will:  -Patient will take medications as prescribed. RNCM will follow up with patient within 30 days and make referral to pharmacy. Calls pharmacy for medication refills Calls provider office for new concerns or questions  Follow Up Plan: The Managed Medicaid care management team will reach out to the patient again over the next 30 days.  The patient has been provided with contact information for the Managed Medicaid care management team and has been advised to call with any health related questions or concerns.     Follow Up:  Patient agrees to Care Plan and Follow-up.  Plan: The Managed Medicaid care management team will reach out to the patient again over the next 30 days. and The patient has been provided with contact information for the Managed Medicaid care management team and has been advised to call with any health related questions or concerns.  Date/time of next scheduled RN care management/care coordination outreach:  01/22/21 at 1030.

## 2020-12-22 NOTE — Patient Instructions (Signed)
Hi Alison [name], thank you for speaking with me today-I hope you feel better soon.  Alison [name] was given information about Medicaid Managed Care team care coordination services as a part of their Healthy [facility] Medicaid benefit. Alison [name] verbally consented to engagement with the [facility] Managed Care team.   For questions related to your Healthy [facility] health plan, please call: [phone] or visit the homepage here: [email or web address]  If you would like to schedule transportation through your Healthy [facility] plan, please call the following number at least 2 days in advance of your appointment: [phone]   Call the [place] at [phone], at any time, 24 hours a day, 7 days a week. If you are in danger or need immediate medical attention call 911.  Patient verbalizes understanding of instructions provided today.   The Managed Medicaid care management team will reach out to the patient again over the next 30 days.  The patient has been provided with contact information for the Managed Medicaid care management team and has been advised to call with any health related questions or concerns.   [name], [name] [place]  [facility] [employer] - Managed Medicaid High Risk [phone].  Following is a copy of your plan of care:  Patient Care Plan: Chronic Pain (Adult)    Problem Identified: Chronic Pain Management-fibromyalgia     Long-Range Goal: Fibromyalgia pain managed-new pain management provider   Start Date: 08/14/2020  Expected End Date: 02/21/2021  Recent Progress: On track  Priority: High  Note:   Current Barriers:  . Care Coordination needs related to pain management provider.   Patient is experiencing fibromyalgia flair and is interested in help being connected to a pain management provider. . Unable to independently secure referral or appointment for pain  management provider. [name] Update 11/24/20:  Patient is being followed by Dr. Humphrey Rolls.  Nurse Case Manager Clinical Goal(s):  [name], patient will work with [facility] to address needs related to referral for pain management provider and associated care coordination needs. [name] Update 09/22/20:  patient is currently seeing Dr. Humphrey Rolls at [facility].  Interventions:  . Inter-disciplinary care team collaboration (see longitudinal plan of care) . Evaluation of current treatment plan related to fibromyalgia  and patient's adherence to plan as established by provider. [name] with primary care provider regarding recommendations and referral to pain management provider. . Discussed plans with patient for ongoing care management follow up and provided patient with direct contact information for care management team . Anticipate pain education program, pain management support as part of pain management referral.      . [name] contacted patient to check in. Patient stated she has not contacted an eye doctor nor has she seen a specialist. Patient states she thinks she has COVID due to her husband having it. She has not taken a test yet, but has some symptoms. She is isolating in the bedroom and they have someone to cook and bring her food.       [name] Update 11/16/20: [name] completed phone call with patient, she states she is still having symptoms from [place] and has been in bed all month. Patient states she has not had a chance to contact an eye doctor or specialist due to being sick. [name] Update 11/24/20:  Patient with continued fatigue-will follow up with PCP. [name] Pharmacy referral for medication review. [name] Update 12/14/20: [name] completed call with patient, she  stated she is waiting on a call from [facility] about getting a liver biopsy completed. She has not reached out to the eye doctor yet because her liver is top priority right now. [name] Update 12/22/20:  Patient seen by PCP 12/11/20.  Has not seen  Opthamologist yet.  Had liver biopsy 12/20/20 due to increased liver enzymes.   Patient Goals/Self-Care Activities Over the next 45 days, patient will:  -Attends all scheduled provider appointments  develop a personal pain management plan with your pain management provider.  Follow Up Plan:  The Managed Medicaid care management team will reach out to the patient again over the next 30 days.    Patient Care Plan: Wellness (Adult)    Problem Identified: Medication Adherence (Wellness)     Goal: Medication Adherence Maintained   Start Date: 09/06/2020  Expected End Date: 12/05/2020  Recent Progress: Not on track  Priority: High  Note:   Current Barriers:  . Patient states she needs to have prescriptions refilled and feels she has some side effects from her medications. [name] Update 12/22/20:  Patient's PCP discontinued Victoza and added Trulicity.  Patient has not been taking mediations as prescribed.  Hgb A1C 8.1 on 12/11/20.  Nurse Case Manager Clinical Goal(s):  [name] Over the next 30 days, patient will work with CM team pharmacist to review current medications.  Interventions:  . Inter-disciplinary care team collaboration (see longitudinal plan of care) . Evaluation of current treatment plan and patient's adherence to plan as established by provider. . Advised patient to contact her PCP for any medication needs. . Reviewed medications with patient. [name] with pharmacy regarding medications.  . Discussed plans with patient for ongoing care management follow up and provided patient with direct contact information for care management team . Pharmacy referral for medication review . Update 12/22/20:  Patient has an appointment with Pharmacist 12/25/20.  Patient Goals/Self-Care Activities Over the next 30 days, patient will:  -Patient will take medications as prescribed. RNCM will follow up with patient within 30 days and make referral to pharmacy. Calls pharmacy for medication  refills Calls provider office for new concerns or questions Follow Up Plan: The Managed Medicaid care management team will reach out to the patient again over the next 30 days.  The patient has been provided with contact information for the Managed Medicaid care management team and has been advised to call with any health related questions or concerns.

## 2020-12-25 ENCOUNTER — Telehealth: Payer: Self-pay

## 2020-12-25 ENCOUNTER — Other Ambulatory Visit: Payer: Self-pay

## 2020-12-25 ENCOUNTER — Telehealth: Payer: Self-pay | Admitting: *Deleted

## 2020-12-25 DIAGNOSIS — F3181 Bipolar II disorder: Secondary | ICD-10-CM | POA: Diagnosis not present

## 2020-12-25 DIAGNOSIS — F4310 Post-traumatic stress disorder, unspecified: Secondary | ICD-10-CM | POA: Diagnosis not present

## 2020-12-25 NOTE — Patient Instructions (Signed)
Visit Information  Alison [name] was given information about Medicaid Managed Care team care coordination services as a part of their Healthy [facility] Medicaid benefit. Alison [name] verbally consented to engagement with the [facility] Managed Care team.   For questions related to your Healthy [facility] health plan, please call: [phone] or visit the homepage here: [email or web address]  If you would like to schedule transportation through your Healthy [facility] plan, please call the following number at least 2 days in advance of your appointment: [phone]   Call the [place] at [phone], at any time, 24 hours a day, 7 days a week. If you are in danger or need immediate medical attention call 911.  Alison [name] - following are the goals we discussed in your visit today:  Goals Addressed            This Visit's Progress   . Get sugars at goal       Timeframe:  Short-Term Goal Priority:  High Start Date:                             Expected End Date:                       Follow Up Date Monthly    - call for medicine refill 2 or 3 days before it runs out - call if I am sick and can't take my medicine - keep a list of all the medicines I take; vitamins and herbals too    Why is this important?   . These steps will help you keep on track with your medicines.   Notes:        Please see education materials related to DM provided as print materials.   Patient verbalizes understanding of instructions provided today.   The Managed Medicaid care management team will reach out to the patient again over the next 30 days.   Alison [name], Pharm.D., Managed Medicaid Pharmacist [phone]   Following is a copy of your plan of care:  Patient Care Plan: Chronic Pain (Adult)    Problem Identified: Chronic Pain Management-fibromyalgia     Long-Range Goal: Fibromyalgia pain managed-new pain management provider    Start Date: 08/14/2020  Expected End Date: 02/21/2021  Recent Progress: On track  Priority: High  Note:   Current Barriers:  . Care Coordination needs related to pain management provider.   Patient is experiencing fibromyalgia flair and is interested in help being connected to a pain management provider. . Unable to independently secure referral or appointment for pain management provider. [name] Update 11/24/20:  Patient is being followed by Dr. Humphrey Rolls.  Nurse Case Manager Clinical Goal(s):  [name], patient will work with [facility] to address needs related to referral for pain management provider and associated care coordination needs. [name] Update 09/22/20:  patient is currently seeing Dr. Humphrey Rolls at [facility].  Interventions:  . Inter-disciplinary care team collaboration (see longitudinal plan of care) . Evaluation of current treatment plan related to fibromyalgia  and patient's adherence to plan as established by provider. [name] with primary care provider regarding recommendations and referral to pain management provider. . Discussed plans with patient for ongoing care management follow up and provided patient with direct contact information for care management team . Anticipate pain education program, pain management support as part of pain management referral.      .  [name] contacted patient to check in. Patient stated she has not contacted an eye doctor nor has she seen a specialist. Patient states she thinks she has COVID due to her husband having it. She has not taken a test yet, but has some symptoms. She is isolating in the bedroom and they have someone to cook and bring her food.       [name] Update 11/16/20: [name] completed phone call with patient, she states she is still having symptoms from [place] and has been in bed all month. Patient states she has not had a chance to contact an eye doctor or specialist due to being sick. [name] Update 11/24/20:  Patient with  continued fatigue-will follow up with PCP. [name] Pharmacy referral for medication review. [name] Update 12/14/20: [name] completed call with patient, she stated she is waiting on a call from [facility] about getting a liver biopsy completed. She has not reached out to the eye doctor yet because her liver is top priority right now. [name] Update 12/22/20:  Patient seen by PCP 12/11/20.  Has not seen Opthamologist yet.  Had liver biopsy 12/20/20 due to increased liver enzymes.   Patient Goals/Self-Care Activities Over the next 45 days, patient will:  -Attends all scheduled provider appointments  develop a personal pain management plan with your pain management provider.  Follow Up Plan:  The Managed Medicaid care management team will reach out to the patient again over the next 30 days.              Patient Care Plan: Wellness (Adult)    Problem Identified: Medication Adherence (Wellness)     Goal: Medication Adherence Maintained   Start Date: 09/06/2020  Expected End Date: 12/05/2020  Recent Progress: Not on track  Priority: High  Note:   Current Barriers:  . Patient states she needs to have prescriptions refilled and feels she has some side effects from her medications. [name] Update 12/22/20:  Patient's PCP discontinued Victoza and added Trulicity.  Patient has not been taking mediations as prescribed.  Hgb A1C 8.1 on 12/11/20.  Nurse Case Manager Clinical Goal(s):  [name] Over the next 30 days, patient will work with CM team pharmacist to review current medications.  Interventions:  . Inter-disciplinary care team collaboration (see longitudinal plan of care) . Evaluation of current treatment plan and patient's adherence to plan as established by provider. . Advised patient to contact her PCP for any medication needs. . Reviewed medications with patient. [name] with pharmacy regarding medications.  . Discussed plans with patient for ongoing care management follow up and provided patient with direct contact  information for care management team . Pharmacy referral for medication review . Update 12/22/20:  Patient has an appointment with Pharmacist 12/25/20.  Patient Goals/Self-Care Activities Over the next 30 days, patient will:  -Patient will take medications as prescribed. RNCM will follow up with patient within 30 days and make referral to pharmacy. Calls pharmacy for medication refills Calls provider office for new concerns or questions  Follow Up Plan: The Managed Medicaid care management team will reach out to the patient again over the next 30 days.  The patient has been provided with contact information for the Managed Medicaid care management team and has been advised to call with any health related questions or concerns.       Patient Care Plan: Medication Management    Problem Identified: Health Promotion or Disease Self-Management (General Plan of Care)     Goal: Self-Management Plan Developed  Note:   Current Barriers:  . Unable to achieve control of DM  .   Pharmacist Clinical Goal(s):  [name] Over the next 30 days, patient will achieve adherence to monitoring guidelines and medication adherence to achieve therapeutic efficacy . contact provider office for questions/concerns as evidenced notation of same in electronic health record through collaboration with PharmD and provider.  .   Interventions: . Inter-disciplinary care team collaboration (see longitudinal plan of care) . Comprehensive medication review performed; medication list updated in electronic medical record  @RXCPDIABETES @ @RXCPHYPERTENSION @ @RXCPHYPERLIPIDEMIA @  Patient Goals/Self-Care Activities . Over the next 30 days, patient will:  - take medications as prescribed collaborate with provider on medication access solutions  Follow Up Plan: The care management team will reach out to the patient again over the next 30 days.     Task: Mutually Develop and Royce Macadamia Achievement of Patient Goals   Note:    Care Management Activities:    - verbalization of feelings encouraged    Notes:

## 2020-12-25 NOTE — Patient Outreach (Signed)
Medicaid Managed Care    Pharmacy Note  12/25/2020 Name: Alison [name] MRN: [id number] DOB: [birth year]  Alison [name] is a 45 y.o. year old female who is a primary care patient of [place]. The [facility] Managed Care Coordination team was consulted for assistance with disease management and care coordination needs.    Engaged with patient Engaged with patient by telephone for follow up visit in response to referral for case management and/or care coordination services.  Alison [name] was given information about Managed Medicaid Care Coordination team services today. Alison [name] agreed to services and verbal consent obtained.   Objective:  Lab Results  Component Value Date   CREATININE 1.05 (H) 12/22/2020   CREATININE 1.04 12/11/2020   CREATININE 1.24 (H) 11/03/2020    Lab Results  Component Value Date   HGBA1C 8.9 (A) 12/11/2020       Component Value Date/Time   CHOL 219 (H) 12/11/2020 0000   TRIG 333 (H) 12/11/2020 0000   HDL 38 (L) 12/11/2020 0000   CHOLHDL 5.8 (H) 12/11/2020 0000   LDLCALC 133 (H) 12/11/2020 0000    Other: (TSH, CBC, Vit D, etc.)  Clinical ASCVD: No  The 10-year ASCVD risk score Mikey Bussing DC Jr., et al., 2013) is: 4.9%   Values used to calculate the score:     Age: 6 years     Sex: Female     Is Non-Hispanic African American: No     Diabetic: Yes     Tobacco smoker: No     Systolic Blood Pressure: 203 mmHg     Is BP treated: No     HDL Cholesterol: 38 mg/dL     Total Cholesterol: 219 mg/dL    Other: (CHADS2VASc if Afib, PHQ9 if depression, MMRC or CAT for COPD, ACT, DEXA)  BP Readings from Last 3 Encounters:  12/22/20 (!) 151/90  12/22/20 (!) 145/89  12/11/20 124/82    Assessment/Interventions: Review of patient past medical history, allergies, medications, health status, including review of consultants reports, laboratory and other test data, was performed as part of comprehensive evaluation and provision of  chronic care management services.   Tremors Benztropine 0.82m QD PRN Plan: Patient states she rarely has tremors   BiPolar/PTSD Dr.Eappenfrom psychiatry Depression screen PVa [facility]2/9 07/25/2020 06/05/2020 09/23/2019 09/16/2019 08/10/2019  Decreased Interest 3 3 3 1 1   Down, Depressed, Hopeless 3 3 3 1 3   PHQ - 2 Score 6 6 6 2 4   Altered sleeping 3 3 3  0 0  Tired, decreased energy 2 3 3 1  0  Change in appetite 2 2 3 1 1   Feeling bad or failure about yourself  2 1 0 0 0  Trouble concentrating 3 3 2 1  0  Moving slowly or fidgety/restless 0 0 2 0 0  Suicidal thoughts 0 0 0 0 -  PHQ-9 Score 18 18 19 5 5   Difficult doing work/chores Very difficult Very difficult - Not difficult at all -  Some recent data might be hidden    PHQ last results Paroxetine Hydroxyzine 249mPRN Severe Anxiety Lamotrigine 2566mD Quetiapine 300m63m Tried/Failed: Alprazolam, Duloxetine 90mg78m(60mg 69mnd 30mg A37mStopped due to potential liver damage) Plan: Defer to specialist  Liver Transplant 16 year36old Tacrolimus 1mg QD 17mn: At goal, maintain therapy   DM Endocrine manages Testing: 1/week (hates seeing bad numbers) Urine Albumin Metformin 1000mg B5597CBlicity Tried/Failed: Liraglutide QD (forgets to take 3-4x/week) Dec 2021 Plan: Will keep Liraglutide outside of  fridge in pill bag (Good for 30 days, informed patient of this), this should improve compliance. F/U March, after A1C in Feb. March 8786: Started on Trulicity (From Victoza) but hasn't picked up yet. Told her to pick up ASAP, will f/u 1 month  HTN Lisinopril 22m Plan: At goal, maintain therapy  Pain States related to Chemo damage 09/11/20: Didn't give pain scale but states is content at this time Baclofen 232mTID PRN Pregabalin 20057mTAMPZA Plan: Patient at goal, maintain therapy  Gout Colchicine 0.6mg4mD 1/year Plan: Patient rarely has attacks, no preventative therapy needed at this time  Medications  (Cost) -Patient states Medicaid only pays for 6 prescriptions/month -Verified with MichSharyn LullP at [facility], that she has never heard of this in state of [place] (It is true in other states). Per patient, she stated that she can do 6/month or a doctor can call and override it. Plan: Patient will STOP paying cash for medications and start using insurance, this should save her $50+/month  Medication (Operations) -Patient states she forgets to get refills often. Has a pill box but never uses. States she accidentally takes Duloxetine 60mg44m on accident instead of Duloxetine 60 QD and 30mg 77mould like to get medications in packaging but has to use [facility], due to her paying cash Plan: Will work on out-of pocket expenses at this visit, will inquire about packaging next visit    SDOH (Social Determinants of Health) assessments and interventions performed:    Care Plan  Allergies  Allergen Reactions  . Penicillin G Itching, Rash, Anaphylaxis and Palpitations  . Lamictal [Lamotrigine] Rash  . Carbamazepine Other (See Comments)    Medication interaction-prograf  . Hydrocodone-Acetaminophen Itching  . Naproxen Itching    Medications Reviewed Today    Reviewed by QuallsChrystie Nose(Certified Medical Assistant) on 12/22/20 at 1421  30List Status: <None>  Medication Order Taking? Sig Documenting Provider Last Dose Status Informant  baclofen (LIORESAL) 20 MG tablet [id number] 1 tablet (20 mg total) by mouth 3 (three) times daily as needed for muscle spasms. Boyce,Hubbard HartshornTaking Active   benztropine (COGENTIN) 1 MG tablet [id number] 1 tablet (1 mg total) by mouth daily as needed for tremors. EappenUrsula Alertaking Active   Dulaglutide (TRULICITY) 0.75 M4.765LY/6.5KP336670546568127nject 0.75 mg into the skin once a week. RumbalMyles Gipaking Active   DULoxetine (CYMBALTA) 30 MG capsule [id number] 1 capsule (30 mg total) by mouth daily. Stop taking  after a week [name], SarammRia Clockaking Active   glucose blood (PRECISION QID TEST) test strip [id number] once daily Use as instructed. [provider] Taking Active   Lidocaine 0.5 % AERO [id number] topically. [provider] Taking Active   magnesium oxide (MAG-OX) 400 MG tablet [id number] by mouth. [provider] Taking Active   Melatonin 5 MG CAPS [id number] 1-2 capsules (5-10 mg total) by mouth at bedtime. EappenUrsula Alertaking Active   metFORMIN (GLUCOPHAGE) 1000 MG tablet [id number] 1 tablet (1,000 mg total) by mouth 2 (two) times daily with a meal. RumbalMyles Gipaking Active   Multiple Vitamin (MULTI-VITAMIN) tablet [id number] 1 tablet by mouth daily. [provider] Taking Active   oxyCODONE ER 13.5 MG C12A [id number] 1 capsule by mouth 2 (two) times daily. [provider] Taking Active   PARoxetine (PAXIL) 10 MG  tablet [id number] Yes Take 1 tablet (10 mg total) by mouth daily. [name], MD Taking Active   pregabalin (LYRICA) 200 MG capsule [id number]  Take 1 capsule (200 mg total) by mouth 3 (three) times daily. [name], MD  Expired 06/06/20 2359            Med Note (CRAFT, [name]   Wed Sep 06, 2020  2:51 PM) Taking  promethazine (PHENERGAN) 25 MG tablet [id number] Yes Take 1 tablet (25 mg total) by mouth as needed. Myles Gip, DO Taking Active   QUEtiapine (SEROQUEL XR) 300 MG 24 hr tablet [id number] Yes Take 1 tablet (300 mg total) by mouth at bedtime. [name], MD Taking Active   rosuvastatin (CRESTOR) 5 MG tablet [id number] Yes Take 1 tablet (5 mg total) by mouth daily. Myles Gip, DO Taking Active   SUMAtriptan (IMITREX) 100 MG tablet [id number] Yes TAKE 1 TABLET BY MOUTH ONCE AS NEEDED FOR MIGRAINE. MAY TAKE A SECOND DOSE AFTER 2 HOURS IF NEEDED [provider] Taking Active   tacrolimus (PROGRAF) 1 MG capsule [id number] Yes Take 1  capsule by mouth once daily [name], FNP Taking Active            Med Note [name] Sep 11, 2020 10:48 AM) AM          Patient Active Problem List   Diagnosis Date Noted  . Opioid dependence with opioid-induced disorder ([place]) 12/20/2020  . 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[place]) 12/20/2020  . Bipolar disorder, in full remission, most recent episode depressed ([place]) 09/27/2020  . Suprapubic pain 07/25/2020  . Bipolar 1 disorder, depressed, mild ([place]) 07/10/2020  . Neuroleptic-induced parkinsonism ([place]) 11/12/2019  . Edema of lower extremity 09/16/2019  . Carpal tunnel syndrome, left 07/26/2019  . Cubital tunnel syndrome on left 07/26/2019  . Bipolar I disorder, most recent episode depressed ([place]) 07/12/2019  . PTSD (post-traumatic stress disorder) 07/12/2019  . Umbilical hernia without obstruction and without gangrene 06/17/2019  . Encounter for surveillance of abnormal nevi 06/17/2019  . Pineal gland cyst 06/17/2019  . Chronic hip pain, left 06/02/2019  . Lumbar spondylosis 06/02/2019  . Mouth dryness 06/02/2019  . Obesity (BMI 35.0-39.9 without comorbidity) 06/02/2019  . Goals of care, counseling/discussion 05/18/2019  . 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[place]) 05/18/2019  . Occipital neuralgia 05/13/2019  . Plantar fasciitis of left foot 04/27/2019  . Fibromyalgia 03/23/2019  . Systemic lupus erythematosus (SLE) in adult ([place]) 03/23/2019  . Essential hypertension 03/23/2019  . Hepatitis 03/23/2019  . Mass of left kidney 03/23/2019  . Diabetes mellitus ([place]) 03/23/2019  . Nausea without vomiting 03/23/2019  . Neuropathy 03/23/2019  . Cancer ([place]) 03/23/2019  . Hyperlipidemia 03/23/2019  . Osteoarthritis 03/23/2019  . Trigeminal neuralgia 03/23/2019  . Chronic renal disease, stage III ([place]) 03/23/2019  . Nephrolithiasis 03/23/2019  .  Migraine 03/23/2019  . OSA on CPAP 03/23/2019  . Fibrocystic breast 03/23/2019  . Heart murmur 03/23/2019  . Bipolar disorder, in partial remission, most recent episode depressed ([place]) 07/02/2017  . Abnormal uterine bleeding 03/18/2017  . Major depressive disorder, recurrent ([place]) 03/18/2017  . Morbid obesity ([place]) 03/18/2017  . Secondary hyperparathyroidism ([place]) 02/07/2017  . Neuropathic pain 11/19/2016  . Post herpetic neuralgia 11/19/2016  . 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[place]) 01/13/2015  . Lumbar disc disease with radiculopathy 07/26/2013  . S/P liver transplant ([place])  07/26/2013  . Type 2 diabetes mellitus, uncontrolled ([place]) 07/26/2013  . Facial nerve disorder 06/29/2012  . Low back pain 12/26/2010    Conditions to be addressed/monitored: HTN, HLD and DM  Care Plan : Medication Management  Updates made by Lane Hacker, [place] since 12/25/2020 12:00 AM    Problem: Health Promotion or Disease Self-Management (General Plan of Care)     Goal: Self-Management Plan Developed   Note:   Current Barriers:  . Unable to achieve control of DM  .   Pharmacist Clinical Goal(s):  [name] Over the next 30 days, patient will achieve adherence to monitoring guidelines and medication adherence to achieve therapeutic efficacy . contact provider office for questions/concerns as evidenced notation of same in electronic health record through collaboration with PharmD and provider.  .   Interventions: . Inter-disciplinary care team collaboration (see longitudinal plan of care) . Comprehensive medication review performed; medication list updated in electronic medical record  @RXCPDIABETES @ @RXCPHYPERTENSION @ @RXCPHYPERLIPIDEMIA @  Patient Goals/Self-Care Activities . Over the next 30 days, patient will:  - take medications as prescribed collaborate with provider on medication access solutions  Follow Up Plan: The care management team will reach out to the patient again over the next  30 days.     Task: Mutually Develop and Royce Macadamia Achievement of Patient Goals   Note:   Care Management Activities:    - verbalization of feelings encouraged    Notes:      Medication Assistance: None required. Patient affirms current coverage meets needs.   Follow up: Agree   Plan: The care management team will reach out to the patient again over the next 30 days.   [place] Constable, Pharm.D., Managed Medicaid Pharmacist - [phone]

## 2020-12-25 NOTE — Telephone Encounter (Signed)
Message received from Dr. Janese Banks that patient was requesting pathology results of recent liver biopsy at [facility]. Patient to reach out to [facility] for results. Patient reports that she has the results was just needing clarification, she has the contact information for [facility] and will place follow up call.

## 2020-12-25 NOTE — Telephone Encounter (Signed)
Myrbetriq 50 mg - PA Case: [id number], Status: Approved, Coverage Starts on: 12/25/2020 12:00:00 AM, Coverage Ends on: 12/25/2021 12:00:00 AM. Patient notified.

## 2020-12-27 NOTE — Progress Notes (Signed)
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 She is currently on CellCept and tacrolimus for the same. She also has a history of post transplant lymphoproliferative disorder/DLBCL that was diagnosed in 2016. She is s/p 6 cycles of R-CHOP chemotherapy back then and was in complete remission 1. Her other past medical history significant for migraines, stage III chronic kidney disease, chemo-induced peripheral neuropathy, hypertension among other medical problems. With regards to her diffuse large B-cell lymphoma she was getting surveillance scans and this was all done in [place]. She has now moved to [place] to be with her significant other.Patient was noted to have a prior kidney mass before which was biopsied and was not consistent with malignancy.  She underwent CT neck with contrast before she left [place] on 02/22/2019. She was noted to have soft tissue masses in both the parotid glands which were new as compared to prior exams and possibly represent lymph nodes. The largest one was seen in the left parotid gland measuring 1.3 x 1.1 cm. Before she could get a work-up for this patient moved to [place] and  has not had any further work-up yet  Her other prior imaging is as follows: MRI thoracic spine without contrast in August 2019 showed moderate multilevel spondylosis but no evidence of lymphoma. Multinodular thyroid in June 2019. MRI cervical spine May 2019 again showed spondylosis but no other acute pathology. MRI brain showed incidental partial opacification of the right and right sinus. No evidence of malignancy. I do not have any other PET CT scan or treatment records from the past.  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 Bone marrow biopsywas negative for lymphoma.Patient completed 4 cycles of weekly RituxanIn September 2020. Repeat PET CT scan showed interval resolution of the parotid lesions compatible with complete response to therapy   Interval history-patient recently had a transjugular liver biopsy at [facility] for elevated LFTs and is currently awaiting those biopsy results.  Otherwise she reports doing well.  Appetite and weight have remained stable.  Denies any lumps or bumps anywhere.  ECOG PS- 1 Pain scale- 0   Review of systems- Review of Systems  Constitutional: Positive for malaise/fatigue. Negative for chills, fever and weight loss.  HENT: Negative for congestion, ear discharge and nosebleeds.   Eyes: Negative for blurred vision.  Respiratory: Negative for cough, hemoptysis, sputum production, shortness of breath and wheezing.   Cardiovascular: Negative for chest pain, palpitations, orthopnea and claudication.  Gastrointestinal: Negative for abdominal pain, blood in stool, constipation, diarrhea, heartburn, melena, nausea and vomiting.  Genitourinary: Negative for dysuria, flank pain, frequency, hematuria and urgency.  Musculoskeletal: Negative for back pain, joint pain and myalgias.  Skin: Negative for rash.  Neurological: Negative for dizziness, tingling, focal weakness, seizures, weakness and headaches.   Endo/Heme/Allergies: Does not bruise/bleed  easily.  Psychiatric/Behavioral: Negative for depression and suicidal ideas. The patient does not have insomnia.       Allergies  Allergen Reactions  . Penicillin G Itching, Rash, Anaphylaxis and Palpitations  . Lamictal [Lamotrigine] Rash  . Carbamazepine Other (See Comments)    Medication interaction-prograf  . Hydrocodone-Acetaminophen Itching  . Naproxen Itching     Past Medical History:  Diagnosis Date  . Abnormal uterine bleeding   . Allergy   . Anxiety   . Arthritis   . Bipolar disorder (manic depression) ([place])   . Chronic kidney failure   . Chronic renal disease, stage III ([place])   . Diabetes mellitus without complication ([place])   . 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[place]) 2015   Right axillary lymph node resected and chemo tx's.  . FH: trigeminal neuralgia   . GERD (gastroesophageal reflux disease)   . Heart murmur   . Hypertension   . Kidney mass   . Lupus ([place])   . Lupus ([place])   . Major depressive disorder   . 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[place]) 06/2019   Chemo tx's  . Migraine   . Morbid obesity ([place])   . Neuromuscular disorder (HCC)    neuropathy  . Neuropathy   . Personality disorder ([place])   . Post herpetic neuralgia   . PTSD (post-traumatic stress disorder)   . Renal disorder   . S/P liver transplant [facility])      Past Surgical History:  Procedure Laterality Date  . BONE MARROW BIOPSY  01/13/2015  . BREAST BIOPSY  12/2014  . BREAST BIOPSY  2011  . BREAST SURGERY    . CHOLECYSTECTOMY    . HERNIA REPAIR    . infusaport    . LIVER TRANSPLANT  12/17/1991  . LUMBAR PUNCTURE    . PORT A CATH INJECTION (Cut Off HX)    . tumor removal  2015    Social History   Socioeconomic History  . Marital status: Single    Spouse name: Not on file  . Number of children: 0  . Years of education: 9  . Highest education level: GED or equivalent  Occupational History  . Occupation: disabled  Tobacco Use  .  Smoking status: Never Smoker  . Smokeless tobacco: Never Used  Vaping Use  . Vaping Use: Never used  Substance and Sexual Activity  . Alcohol use: Not Currently  . Drug use: Not Currently    Types: Marijuana    Comment: pain managment last used in early april  . Sexual activity: Not Currently  Other Topics Concern  . Not on file  Social History Narrative  . Not on file   Social Determinants of Health   Financial Resource Strain: Not on file  Food Insecurity: Not on file  Transportation Needs: Not on file  Physical Activity: Not on file  Stress: Not on file  Social Connections: Not on file  Intimate Partner Violence: Not on file    Family History  Problem Relation Age of Onset  . Heart disease Mother   . Hypertension Mother   . Cancer - Other Mother   . Bipolar disorder Mother   . Heart disease Father   . Hypertension Father   . Diabetes Father   . Parkinson's disease Maternal Grandmother   . Cancer Maternal Aunt   . Cancer Maternal Uncle   . Cancer Maternal Grandfather   . Lupus Paternal Grandmother   . Hypertension Brother      Current Outpatient  Medications:  .  baclofen (LIORESAL) 20 MG tablet, Take 1 tablet (20 mg total) by mouth 3 (three) times daily as needed for muscle spasms., Disp: 90 each, Rfl: 2 .  benztropine (COGENTIN) 1 MG tablet, Take 1 tablet (1 mg total) by mouth daily as needed for tremors., Disp: 30 tablet, Rfl: 1 .  DULoxetine (CYMBALTA) 30 MG capsule, Take 1 capsule (30 mg total) by mouth daily. Stop taking after a week, Disp: 7 capsule, Rfl: 0 .  glucose blood (PRECISION QID TEST) test strip, Use once daily Use as instructed., Disp: , Rfl:  .  Lidocaine 0.5 % AERO, Apply topically., Disp: , Rfl:  .  magnesium oxide (MAG-OX) 400 MG tablet, Take by mouth., Disp: , Rfl:  .  Melatonin 5 MG CAPS, Take 1-2 capsules (5-10 mg total) by mouth at bedtime., Disp: 100 capsule, Rfl: 0 .  metFORMIN (GLUCOPHAGE) 1000 MG tablet, Take 1 tablet (1,000 mg total)  by mouth 2 (two) times daily with a meal., Disp: 180 tablet, Rfl: 3 .  Multiple Vitamin (MULTI-VITAMIN) tablet, Take 1 tablet by mouth daily., Disp: , Rfl:  .  oxyCODONE ER 13.5 MG C12A, Take 1 capsule by mouth 2 (two) times daily., Disp: , Rfl:  .  PARoxetine (PAXIL) 10 MG tablet, Take 1 tablet (10 mg total) by mouth daily., Disp: 30 tablet, Rfl: 1 .  promethazine (PHENERGAN) 25 MG tablet, Take 1 tablet (25 mg total) by mouth as needed., Disp: 15 tablet, Rfl: 0 .  QUEtiapine (SEROQUEL XR) 300 MG 24 hr tablet, Take 1 tablet (300 mg total) by mouth at bedtime., Disp: 90 tablet, Rfl: 0 .  SUMAtriptan (IMITREX) 100 MG tablet, TAKE 1 TABLET BY MOUTH ONCE AS NEEDED FOR MIGRAINE. MAY TAKE A SECOND DOSE AFTER 2 HOURS IF NEEDED, Disp: , Rfl:  .  tacrolimus (PROGRAF) 1 MG capsule, Take 1 capsule by mouth once daily, Disp: 30 capsule, Rfl: 0 .  Dulaglutide (TRULICITY) 0.94 BS/9.6GE SOPN, Inject 0.75 mg into the skin once a week., Disp: 3 mL, Rfl: 3 .  mirabegron ER (MYRBETRIQ) 50 MG TB24 tablet, Take 1 tablet (50 mg total) by mouth daily., Disp: 90 tablet, Rfl: 3 .  pregabalin (LYRICA) 200 MG capsule, Take 1 capsule (200 mg total) by mouth 3 (three) times daily., Disp: 90 capsule, Rfl: 2 .  rosuvastatin (CRESTOR) 5 MG tablet, Take 1 tablet (5 mg total) by mouth daily., Disp: 90 tablet, Rfl: 3  Physical exam:  Vitals:   12/22/20 1330  BP: (!) 145/89  Pulse: 71  Temp: (!) 97.2 F (36.2 C)  TempSrc: Tympanic  SpO2: 96%  Weight: 267 lb (121.1 kg)   Physical Exam Cardiovascular:     Rate and Rhythm: Normal rate and regular rhythm.     Heart sounds: Normal heart sounds.  Pulmonary:     Effort: Pulmonary effort is normal.     Breath sounds: Normal breath sounds.  Abdominal:     General: Bowel sounds are normal.     Palpations: Abdomen is soft.  Lymphadenopathy:     Comments: No palpable cervical, supraclavicular, axillary or inguinal adenopathy   Skin:    General: Skin is warm and dry.   Neurological:     Mental Status: She is alert and oriented to person, place, and time.      CMP Latest Ref Rng & Units 12/22/2020  Glucose 70 - 99 mg/dL 398(H)  BUN 6 - 20 mg/dL 17  Creatinine 0.44 - 1.00 mg/dL 1.05(H)  Sodium 135 - 145 mmol/L 135  Potassium 3.5 - 5.1 mmol/L 4.4  Chloride 98 - 111 mmol/L 100  CO2 22 - 32 mmol/L 24  Calcium 8.9 - 10.3 mg/dL 9.0  Total Protein 6.5 - 8.1 g/dL 7.4  Total Bilirubin 0.3 - 1.2 mg/dL 0.7  Alkaline Phos 38 - 126 U/L 66  AST 15 - 41 U/L 68(H)  ALT 0 - 44 U/L 51(H)   CBC Latest Ref Rng & Units 12/22/2020  WBC 4.0 - 10.5 K/uL 3.5(L)  Hemoglobin 12.0 - 15.0 g/dL 13.2  Hematocrit 36.0 - 46.0 % 39.4  Platelets 150 - 400 K/uL 105(L)    Assessment and plan- Patient is a 45 y.o. 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  This is a routine follow-up visit  Clinically patient is doing well with no concerning signs and symptoms of recurrence based on today's exam.  She was noted to have mild intermittent thrombocytopenia dating back to August 2020.  Today her platelet counts are 105.  Unclear if this is related to her underlying liver issues as well.  I will plan to follow-up on her CBC in 3 months in 6 months and see her back in 6 months.  No indication for imaging at this time.  History of liver transplant on immunosuppressive medications and is currently being followed by [facility].  She is currently awaiting the results of transjugular biopsy.  Mild chronically elevated LFTs which is being followed by [facility] as well.   Visit Diagnosis 1. Encounter for follow-up surveillance of lymphoma      Dr. Randa Evens, MD, MPH [facility] at [facility] [phone] 12/27/2020 9:15 AM

## 2020-12-28 ENCOUNTER — Other Ambulatory Visit (HOSPITAL_COMMUNITY): Payer: Self-pay | Admitting: Transplant Hepatology

## 2020-12-28 ENCOUNTER — Other Ambulatory Visit: Payer: Self-pay | Admitting: Transplant Hepatology

## 2020-12-28 DIAGNOSIS — K7581 Nonalcoholic steatohepatitis (NASH): Secondary | ICD-10-CM

## 2020-12-28 DIAGNOSIS — K7460 Unspecified cirrhosis of liver: Secondary | ICD-10-CM

## 2020-12-28 DIAGNOSIS — Z944 Liver transplant status: Secondary | ICD-10-CM

## 2021-01-01 ENCOUNTER — Encounter: Payer: Self-pay | Admitting: *Deleted

## 2021-01-02 DIAGNOSIS — F4310 Post-traumatic stress disorder, unspecified: Secondary | ICD-10-CM | POA: Diagnosis not present

## 2021-01-02 DIAGNOSIS — F3181 Bipolar II disorder: Secondary | ICD-10-CM | POA: Diagnosis not present

## 2021-01-03 ENCOUNTER — Other Ambulatory Visit: Payer: Self-pay

## 2021-01-03 ENCOUNTER — Telehealth: Payer: Self-pay | Admitting: Gastroenterology

## 2021-01-03 DIAGNOSIS — K7469 Other cirrhosis of liver: Secondary | ICD-10-CM

## 2021-01-03 DIAGNOSIS — K7581 Nonalcoholic steatohepatitis (NASH): Secondary | ICD-10-CM

## 2021-01-03 NOTE — Telephone Encounter (Signed)
Please call [name] [facility] Liver Transplant # [phone] when her EGD has been scheduled. Thanks

## 2021-01-03 NOTE — Telephone Encounter (Signed)
Returned call to [facility] with [facility] Liver transplant regarding EGD scheduled with Dr. Allen Norris at [facility] on 01/09/21. This information was left on [employer].

## 2021-01-05 ENCOUNTER — Other Ambulatory Visit
Admission: RE | Admit: 2021-01-05 | Discharge: 2021-01-05 | Disposition: A | Discharge status: Home / Self Care | Payer: Medicaid Other | Source: Ambulatory Visit | Attending: Gastroenterology | Admitting: Gastroenterology

## 2021-01-05 ENCOUNTER — Other Ambulatory Visit: Payer: Self-pay

## 2021-01-05 DIAGNOSIS — Z01812 Encounter for preprocedural laboratory examination: Secondary | ICD-10-CM | POA: Insufficient documentation

## 2021-01-05 DIAGNOSIS — Z20822 Contact with and (suspected) exposure to covid-19: Secondary | ICD-10-CM | POA: Diagnosis not present

## 2021-01-05 LAB — SARS CORONAVIRUS 2 (TAT 6-24 HRS): SARS Coronavirus 2: NEGATIVE

## 2021-01-06 DIAGNOSIS — F3181 Bipolar II disorder: Secondary | ICD-10-CM | POA: Diagnosis not present

## 2021-01-06 DIAGNOSIS — F4310 Post-traumatic stress disorder, unspecified: Secondary | ICD-10-CM | POA: Diagnosis not present

## 2021-01-09 ENCOUNTER — Encounter
Admission: RE | Disposition: A | Discharge status: Home / Self Care | Payer: Self-pay | Source: Home / Self Care | Attending: Gastroenterology

## 2021-01-09 ENCOUNTER — Encounter: Payer: Self-pay | Admitting: Gastroenterology

## 2021-01-09 ENCOUNTER — Ambulatory Visit: Payer: Medicaid Other | Admitting: Anesthesiology

## 2021-01-09 ENCOUNTER — Ambulatory Visit
Admission: RE | Admit: 2021-01-09 | Discharge: 2021-01-09 | Disposition: A | Discharge status: Home / Self Care | Payer: Medicaid Other | Attending: Gastroenterology | Admitting: Gastroenterology

## 2021-01-09 ENCOUNTER — Other Ambulatory Visit: Payer: Self-pay

## 2021-01-09 DIAGNOSIS — Z820 Family history of epilepsy and other diseases of the nervous system: Secondary | ICD-10-CM | POA: Insufficient documentation

## 2021-01-09 DIAGNOSIS — Z888 Allergy status to other drugs, medicaments and biological substances status: Secondary | ICD-10-CM | POA: Insufficient documentation

## 2021-01-09 DIAGNOSIS — M329 Systemic lupus erythematosus, unspecified: Secondary | ICD-10-CM | POA: Insufficient documentation

## 2021-01-09 DIAGNOSIS — Z9221 Personal history of antineoplastic chemotherapy: Secondary | ICD-10-CM | POA: Diagnosis not present

## 2021-01-09 DIAGNOSIS — Z79899 Other long term (current) drug therapy: Secondary | ICD-10-CM | POA: Diagnosis not present

## 2021-01-09 DIAGNOSIS — I129 Hypertensive chronic kidney disease with stage 1 through stage 4 chronic kidney disease, or unspecified chronic kidney disease: Secondary | ICD-10-CM | POA: Insufficient documentation

## 2021-01-09 DIAGNOSIS — Z791 Long term (current) use of non-steroidal anti-inflammatories (NSAID): Secondary | ICD-10-CM | POA: Diagnosis not present

## 2021-01-09 DIAGNOSIS — Z8349 Family history of other endocrine, nutritional and metabolic diseases: Secondary | ICD-10-CM | POA: Insufficient documentation

## 2021-01-09 DIAGNOSIS — Z833 Family history of diabetes mellitus: Secondary | ICD-10-CM | POA: Insufficient documentation

## 2021-01-09 DIAGNOSIS — E1122 Type 2 diabetes mellitus with diabetic chronic kidney disease: Secondary | ICD-10-CM | POA: Insufficient documentation

## 2021-01-09 DIAGNOSIS — Z944 Liver transplant status: Secondary | ICD-10-CM | POA: Insufficient documentation

## 2021-01-09 DIAGNOSIS — Z885 Allergy status to narcotic agent status: Secondary | ICD-10-CM | POA: Diagnosis not present

## 2021-01-09 DIAGNOSIS — G4733 Obstructive sleep apnea (adult) (pediatric): Secondary | ICD-10-CM | POA: Diagnosis not present

## 2021-01-09 DIAGNOSIS — Z8489 Family history of other specified conditions: Secondary | ICD-10-CM | POA: Insufficient documentation

## 2021-01-09 DIAGNOSIS — I8510 Secondary esophageal varices without bleeding: Secondary | ICD-10-CM | POA: Insufficient documentation

## 2021-01-09 DIAGNOSIS — Z8249 Family history of ischemic heart disease and other diseases of the circulatory system: Secondary | ICD-10-CM | POA: Diagnosis not present

## 2021-01-09 DIAGNOSIS — K7581 Nonalcoholic steatohepatitis (NASH): Secondary | ICD-10-CM

## 2021-01-09 DIAGNOSIS — K7460 Unspecified cirrhosis of liver: Secondary | ICD-10-CM | POA: Diagnosis not present

## 2021-01-09 DIAGNOSIS — Z8572 Personal history of non-Hodgkin lymphomas: Secondary | ICD-10-CM | POA: Diagnosis not present

## 2021-01-09 DIAGNOSIS — N183 Chronic kidney disease, stage 3 unspecified: Secondary | ICD-10-CM | POA: Insufficient documentation

## 2021-01-09 DIAGNOSIS — Z880 Allergy status to penicillin: Secondary | ICD-10-CM | POA: Insufficient documentation

## 2021-01-09 DIAGNOSIS — K7469 Other cirrhosis of liver: Secondary | ICD-10-CM

## 2021-01-09 DIAGNOSIS — E785 Hyperlipidemia, unspecified: Secondary | ICD-10-CM | POA: Diagnosis not present

## 2021-01-09 DIAGNOSIS — Z809 Family history of malignant neoplasm, unspecified: Secondary | ICD-10-CM | POA: Diagnosis not present

## 2021-01-09 DIAGNOSIS — Z7984 Long term (current) use of oral hypoglycemic drugs: Secondary | ICD-10-CM | POA: Insufficient documentation

## 2021-01-09 HISTORY — PX: ESOPHAGOGASTRODUODENOSCOPY (EGD) WITH PROPOFOL: SHX5813

## 2021-01-09 LAB — POCT PREGNANCY, URINE: Preg Test, Ur: NEGATIVE

## 2021-01-09 LAB — GLUCOSE, CAPILLARY: Glucose-Capillary: 241 mg/dL — ABNORMAL HIGH (ref 70–99)

## 2021-01-09 SURGERY — ESOPHAGOGASTRODUODENOSCOPY (EGD) WITH PROPOFOL
Anesthesia: General

## 2021-01-09 MED ORDER — PROPOFOL 10 MG/ML IV BOLUS
INTRAVENOUS | Status: DC | PRN
  Administered 2021-01-09: 100 mg via INTRAVENOUS

## 2021-01-09 MED ORDER — SODIUM CHLORIDE 0.9 % IV SOLN: INTRAVENOUS | Status: DC

## 2021-01-09 MED ORDER — HEPARIN SOD (PORK) LOCK FLUSH 100 UNIT/ML IV SOLN
INTRAVENOUS | Status: AC
  Filled 2021-01-09: qty 5

## 2021-01-09 MED ORDER — PROPOFOL 500 MG/50ML IV EMUL
INTRAVENOUS | Status: DC | PRN
  Administered 2021-01-09: 150 ug/kg/min via INTRAVENOUS

## 2021-01-09 MED ORDER — LIDOCAINE 2% (20 MG/ML) 5 ML SYRINGE
INTRAMUSCULAR | Status: DC | PRN
  Administered 2021-01-09: 100 mg via INTRAVENOUS

## 2021-01-09 NOTE — Anesthesia Preprocedure Evaluation (Signed)
Anesthesia Evaluation  Patient identified by MRN, date of birth, ID band Patient awake    Reviewed: Allergy & Precautions, NPO status , Patient's Chart, lab work & pertinent test results  History of Anesthesia Complications Negative for: history of anesthetic complications  Airway Mallampati: II  TM Distance: >3 FB Neck ROM: Full    Dental  (+) Missing, Poor Dentition   Pulmonary sleep apnea , neg COPD,    breath sounds clear to auscultation- rhonchi (-) wheezing      Cardiovascular Exercise Tolerance: Good hypertension, Pt. on medications (-) CAD, (-) Past MI, (-) Cardiac Stents and (-) CABG  Rhythm:Regular Rate:Normal - Systolic murmurs and - Diastolic murmurs    Neuro/Psych  Headaches, neg Seizures PSYCHIATRIC DISORDERS Anxiety Depression Bipolar Disorder    GI/Hepatic GERD  ,(+) Cirrhosis       , Hepatitis -S/p liver transplant   Endo/Other  diabetes, Oral Hypoglycemic Agents  Renal/GU negative Renal ROS     Musculoskeletal  (+) Arthritis , Fibromyalgia -  Abdominal (+) + obese,   Peds  Hematology negative hematology ROS (+)   Anesthesia Other Findings Past Medical History: No date: Abnormal uterine bleeding No date: Allergy No date: Anxiety No date: Arthritis No date: Bipolar disorder (manic depression) (HCC) No date: Chronic kidney failure No date: Chronic renal disease, stage III (HCC) No date: Diabetes mellitus without complication ([place]) 5188: 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HCC)     Comment:  Right axillary lymph node resected and chemo tx's. No date: FH: trigeminal neuralgia No date: GERD (gastroesophageal reflux disease) No date: Heart murmur No date: Hypertension No date: Kidney mass No date: Lupus ([place]) No date: Lupus ([place]) No date: Major depressive disorder 06/2019: 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HCC)     Comment:  Chemo tx's No date: Migraine No date: Morbid obesity ([place]) No  date: Neuromuscular disorder ([place])     Comment:  neuropathy No date: Neuropathy No date: Personality disorder ([place]) No date: Post herpetic neuralgia No date: PTSD (post-traumatic stress disorder) No date: Renal disorder No date: S/P liver transplant ([place])   Reproductive/Obstetrics                             Anesthesia Physical Anesthesia Plan  ASA: III  Anesthesia Plan: General   Post-op Pain Management:    Induction: Intravenous  PONV Risk Score and Plan: 2 and Propofol infusion  Airway Management Planned: Natural Airway  Additional Equipment:   Intra-op Plan:   Post-operative Plan:   Informed Consent: I have reviewed the patients History and Physical, chart, labs and discussed the procedure including the risks, benefits and alternatives for the proposed anesthesia with the patient or authorized representative who has indicated his/her understanding and acceptance.     Dental advisory given  Plan Discussed with: CRNA and Anesthesiologist  Anesthesia Plan Comments:         Anesthesia Quick Evaluation

## 2021-01-09 NOTE — Transfer of Care (Signed)
Immediate Anesthesia Transfer of Care Note  Patient: Malik Ruffino  Procedure(s) Performed: ESOPHAGOGASTRODUODENOSCOPY (EGD) WITH PROPOFOL (N/A )  Patient Location: Endoscopy Unit  Anesthesia Type:General  Level of Consciousness: awake and alert   Airway & Oxygen Therapy: Patient Spontanous Breathing  Post-op Assessment: Report given to RN and Post -op Vital signs reviewed and stable  Post vital signs: stable  Last Vitals:  Vitals Value Taken Time  BP 115/84 01/09/21 0904  Temp    Pulse 90 01/09/21 0904  Resp 7 01/09/21 0904  SpO2 95 % 01/09/21 0904  Vitals shown include unvalidated device data.  Last Pain:  Vitals:   01/09/21 0825  TempSrc: Temporal  PainSc: 0-No pain         Complications: No complications documented.

## 2021-01-09 NOTE — Op Note (Signed)
[facility] Patient Name: Alison [name] Procedure Date: 01/09/2021 8:46 AM MRN: [id number] Account #: [id number] Date of Birth: [birth year] Admit Type: Outpatient Age: 45 Room: [facility] ENDO ROOM 4 Gender: Female Note Status: Finalized Procedure:             Upper GI endoscopy Indications:           Cirrhosis with suspected esophageal varices Providers:             [name] MD, MD Medicines:             Propofol per Anesthesia Complications:         No immediate complications. Procedure:             Pre-Anesthesia Assessment:                        - Prior to the procedure, a History and Physical was                         performed, and patient medications and allergies were                         reviewed. The patient's tolerance of previous                         anesthesia was also reviewed. The risks and benefits                         of the procedure and the sedation options and risks                         were discussed with the patient. All questions were                         answered, and informed consent was obtained. Prior                         Anticoagulants: The patient has taken no previous                         anticoagulant or antiplatelet agents. ASA Grade                         Assessment: II - A patient with mild systemic disease.                         After reviewing the risks and benefits, the patient                         was deemed in satisfactory condition to undergo the                         procedure.                        After obtaining informed consent, the endoscope was                         passed under direct vision. Throughout the procedure,  the patient's blood pressure, pulse, and oxygen                         saturations were monitored continuously. The Endoscope                         was introduced through the mouth, and advanced to the                          second part of duodenum. The upper GI endoscopy was                         accomplished without difficulty. The patient tolerated                         the procedure well. Findings:      Grade I varices were found in the lower third of the esophagus.      The stomach was normal.      The examined duodenum was normal. Impression:            - Grade I esophageal varices.                        - Normal stomach.                        - Normal examined duodenum.                        - No specimens collected. Recommendation:        - Discharge patient to home.                        - Resume previous diet.                        - Continue present medications. Procedure Code(s):     --- Professional ---                        [phone], Esophagogastroduodenoscopy, flexible,                         transoral; diagnostic, including collection of                         specimen(s) by brushing or washing, when performed                         (separate procedure) Diagnosis Code(s):     --- Professional ---                        K74.60, Unspecified cirrhosis of liver CPT copyright 2019 American Medical Association. All rights reserved. The codes documented in this report are preliminary and upon coder review may  be revised to meet current compliance requirements. [name] MD, MD 01/09/2021 9:00:50 AM This report has been signed electronically. Number of Addenda: 0 Note Initiated On: 01/09/2021 8:46 AM Estimated Blood Loss:  Estimated blood loss: none.      [facility]

## 2021-01-09 NOTE — Anesthesia Postprocedure Evaluation (Signed)
Anesthesia Post Note  [name]  Procedure(s) Performed: ESOPHAGOGASTRODUODENOSCOPY (EGD) WITH PROPOFOL (N/A )  Patient location during evaluation: Endoscopy Anesthesia Type: General Level of consciousness: awake and alert and oriented Pain management: pain level controlled Vital Signs Assessment: post-procedure vital signs reviewed and stable Respiratory status: spontaneous breathing, nonlabored ventilation and respiratory function stable Cardiovascular status: blood pressure returned to baseline and stable Postop Assessment: no signs of nausea or vomiting Anesthetic complications: no   No complications documented.   Last Vitals:  Vitals:   01/09/21 0825 01/09/21 0905  BP: 128/84 115/84  Pulse: 94 89  Resp: 16 16  Temp: 36.4 C (!) 36.1 C  SpO2: 96% 96%    Last Pain:  Vitals:   01/09/21 0935  TempSrc:   PainSc: 0-No pain                 Atiyah Bauer

## 2021-01-09 NOTE — H&P (Signed)
Alison Lame, MD [facility] [address], [place], [place] [address] Phone:[phone] Fax : [phone]  Primary Care Physician:  [facility], [place] Primary Gastroenterologist:  Dr. Allen Norris  Pre-Procedure History & Physical: HPI:  Alison [name] is a 45 y.o. female is here for an endoscopy.   Past Medical History:  Diagnosis Date  . Abnormal uterine bleeding   . Allergy   . Anxiety   . Arthritis   . Bipolar disorder (manic depression) ([place])   . Chronic kidney failure   . Chronic renal disease, stage III ([place])   . Diabetes mellitus without complication ([place])   . 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[place]) 2015   Right axillary lymph node resected and chemo tx's.  . FH: trigeminal neuralgia   . GERD (gastroesophageal reflux disease)   . Heart murmur   . Hypertension   . Kidney mass   . Lupus ([place])   . Lupus ([place])   . Major depressive disorder   . 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[place]) 06/2019   Chemo tx's  . Migraine   . Morbid obesity ([place])   . Neuromuscular disorder (HCC)    neuropathy  . Neuropathy   . Personality disorder ([place])   . Post herpetic neuralgia   . PTSD (post-traumatic stress disorder)   . Renal disorder   . S/P liver transplant [facility])     Past Surgical History:  Procedure Laterality Date  . BONE MARROW BIOPSY  01/13/2015  . BREAST BIOPSY  12/2014  . BREAST BIOPSY  2011  . BREAST SURGERY    . CHOLECYSTECTOMY    . COLONOSCOPY    . ESOPHAGOGASTRODUODENOSCOPY    . HERNIA REPAIR    . infusaport    . LIVER TRANSPLANT  12/17/1991  . LUMBAR PUNCTURE    . PORT A CATH INJECTION ([place] HX)    . tumor removal  2015    Prior to Admission medications   Medication Sig Start Date End Date Taking? Authorizing Provider  baclofen (LIORESAL) 20 MG tablet Take 1 tablet (20 mg total) by mouth 3 (three) times daily as needed for muscle spasms. 09/16/19  Yes [name], FNP  Dulaglutide (TRULICITY) 1.50 VW/9.7XY SOPN Inject 0.75 mg into the  skin once a week. 12/11/20  Yes Myles Gip, DO  magnesium oxide (MAG-OX) 400 MG tablet Take by mouth.   Yes [provider]  Melatonin 5 MG CAPS Take 1-2 capsules (5-10 mg total) by mouth at bedtime. 09/10/19  Yes [name], MD  metFORMIN (GLUCOPHAGE) 1000 MG tablet Take 1 tablet (1,000 mg total) by mouth 2 (two) times daily with a meal. 12/11/20  Yes Rumball, Jake Church, DO  mirabegron ER (MYRBETRIQ) 50 MG TB24 tablet Take 1 tablet (50 mg total) by mouth daily. 12/22/20  Yes Vaillancourt, Samantha, PA-C  oxyCODONE ER 13.5 MG C12A Take 1 capsule by mouth 2 (two) times daily. 07/28/20  Yes [provider]  PARoxetine (PAXIL) 10 MG tablet Take 1 tablet (10 mg total) by mouth daily. 12/19/20  Yes [name], MD  QUEtiapine (SEROQUEL XR) 300 MG 24 hr tablet Take 1 tablet (300 mg total) by mouth at bedtime. 09/27/20  Yes [name], MD  tacrolimus (PROGRAF) 1 MG capsule Take 1 capsule by mouth once daily 07/08/19  Yes [name], FNP  benztropine (COGENTIN) 1 MG tablet Take 1 tablet (1 mg total) by mouth daily as needed for tremors. 11/20/20   [name], MD  DULoxetine (CYMBALTA) 30  MG capsule Take 1 capsule (30 mg total) by mouth daily. Stop taking after a week 12/19/20   [name], MD  glucose blood (PRECISION QID TEST) test strip Use once daily Use as instructed. 08/06/19   [provider]  Lidocaine 0.5 % AERO Apply topically.    [provider]  Multiple Vitamin (MULTI-VITAMIN) tablet Take 1 tablet by mouth daily.    [provider]  pregabalin (LYRICA) 200 MG capsule Take 1 capsule (200 mg total) by mouth 3 (three) times daily. 07/05/19 06/06/20  [name], MD  promethazine (PHENERGAN) 25 MG tablet Take 1 tablet (25 mg total) by mouth as needed. 12/04/20   Myles Gip, DO  rosuvastatin (CRESTOR) 5 MG tablet Take 1 tablet (5 mg total) by mouth daily. 12/12/20   Myles Gip, DO  SUMAtriptan (IMITREX) 100 MG tablet TAKE  1 TABLET BY MOUTH ONCE AS NEEDED FOR MIGRAINE. MAY TAKE A SECOND DOSE AFTER 2 HOURS IF NEEDED 05/19/19   [provider]    Allergies as of 01/03/2021 - Review Complete 12/22/2020  Allergen Reaction Noted  . Penicillin g Itching, Rash, Anaphylaxis, and Palpitations 1975-10-15  . Lamictal [lamotrigine] Rash 12/19/2020  . Carbamazepine Other (See Comments) 05/18/2019  . Hydrocodone-acetaminophen Itching 05/18/2019  . Naproxen Itching 05/18/2019    Family History  Problem Relation Age of Onset  . Heart disease Mother   . Hypertension Mother   . Cancer - Other Mother   . Bipolar disorder Mother   . Heart disease Father   . Hypertension Father   . Diabetes Father   . Parkinson's disease Maternal Grandmother   . Cancer Maternal Aunt   . Cancer Maternal Uncle   . Cancer Maternal Grandfather   . Lupus Paternal Grandmother   . Hypertension Brother     Social History   Socioeconomic History  . Marital status: Single    Spouse name: Not on file  . Number of children: 0  . Years of education: 9  . Highest education level: GED or equivalent  Occupational History  . Occupation: disabled  Tobacco Use  . Smoking status: Never Smoker  . Smokeless tobacco: Never Used  Vaping Use  . Vaping Use: Never used  Substance and Sexual Activity  . Alcohol use: Not Currently  . Drug use: Not Currently    Types: Marijuana    Comment: pain managment last used in early april  . Sexual activity: Not Currently  Other Topics Concern  . Not on file  Social History Narrative  . Not on file   Social Determinants of Health   Financial Resource Strain: Not on file  Food Insecurity: Not on file  Transportation Needs: Not on file  Physical Activity: Not on file  Stress: Not on file  Social Connections: Not on file  Intimate Partner Violence: Not on file    Review of Systems: See HPI, otherwise negative ROS  Physical Exam: BP 128/84   Pulse 94   Temp 97.6 F (36.4 C) (Temporal)    Resp 16   Ht _0  (1.702 m)   Wt 120.7 kg   LMP 02/04/2018 (Approximate) Comment: Per patient, over a year ago  SpO2 96%   BMI 41.66 kg/m  General:   Alert,  pleasant and cooperative in NAD Head:  Normocephalic and atraumatic. Neck:  Supple; no masses or thyromegaly. Lungs:  Clear throughout to auscultation.    Heart:  Regular rate and rhythm. Abdomen:  Soft, nontender and nondistended. Normal bowel  sounds, without guarding, and without rebound.   Neurologic:  Alert and  oriented x4;  grossly normal neurologically.  Impression/Plan: Alison [name] is here for an endoscopy to be performed for cirrhosis and evaluate for varices  Risks, benefits, limitations, and alternatives regarding  endoscopy have been reviewed with the patient.  Questions have been answered.  All parties agreeable.   Alison Lame, MD  01/09/2021, 8:43 AM

## 2021-01-10 ENCOUNTER — Encounter: Payer: Self-pay | Admitting: Psychiatry

## 2021-01-10 ENCOUNTER — Ambulatory Visit: Payer: Medicaid Other | Admitting: Physician Assistant

## 2021-01-10 ENCOUNTER — Other Ambulatory Visit: Payer: Self-pay

## 2021-01-10 ENCOUNTER — Telehealth (INDEPENDENT_AMBULATORY_CARE_PROVIDER_SITE_OTHER): Payer: Medicaid Other | Admitting: Psychiatry

## 2021-01-10 DIAGNOSIS — G2111 Neuroleptic induced parkinsonism: Secondary | ICD-10-CM | POA: Diagnosis not present

## 2021-01-10 DIAGNOSIS — F4310 Post-traumatic stress disorder, unspecified: Secondary | ICD-10-CM | POA: Diagnosis not present

## 2021-01-10 DIAGNOSIS — F3131 Bipolar disorder, current episode depressed, mild: Secondary | ICD-10-CM | POA: Diagnosis not present

## 2021-01-10 MED ORDER — QUETIAPINE FUMARATE ER 300 MG PO TB24: 300.0000 mg | ORAL_TABLET | Freq: Every day | ORAL | 0 refills | Status: DC

## 2021-01-10 NOTE — Progress Notes (Signed)
Virtual Visit via Video Note  I connected with Alison [name] on 01/10/21 at  4:40 PM EDT by a video enabled telemedicine application and verified that I am speaking with the correct person using two identifiers.  Location Provider Location : [facility] Patient Location : Home  Participants: Patient , Provider    I discussed the limitations of evaluation and management by telemedicine and the availability of in person appointments. The patient expressed understanding and agreed to proceed.    I discussed the assessment and treatment plan with the patient. The patient was provided an opportunity to ask questions and all were answered. The patient agreed with the plan and demonstrated an understanding of the instructions.   The patient was advised to call back or seek an in-person evaluation if the symptoms worsen or if the condition fails to improve as anticipated.   Baird MD OP Progress Note  01/10/2021 4:44 PM Alison [name]  MRN:  [id number]  Chief Complaint:  Chief Complaint    Follow-up; Depression; Anxiety     HPI: Alison [name] is a 45 year old Caucasian female, lives in [place], has a history of bipolar disorder, PTSD, multiple medical problems including fibromyalgia, chronic pain, trigeminal neuralgia, history of liver transplant, B-cell lymphoma, SLE, diabetes melitis, OSA, migraine headaches was evaluated by telemedicine today.  Patient today reports she is not taking the Cymbalta anymore.  She reports her fibromyalgia pain is back and she has been spending a lot of time in her bed.  She however reports she has taken up some projects, like sewing and is currently making a blanket.  She enjoys doing that.  She is also thinking about getting out this weekend to spend some time outdoors.  She hopes she will be able to do that.  She agrees to get in touch with her primary provider about her fibromyalgia pain which is not responding to any medications since being off of the  Cymbalta.  Patient reports she does have anxiety and depressive symptoms however overall she is coping okay.  She wants to give the Paxil more time.  She does not want the dosage increased at this time.  She continues to take the Seroquel at bedtime.  She is aware about the effect of Seroquel on her liver function as well.  She however reports she was not advised to stop it yet and wants to stay on it.  It helps her to sleep and also with her mood.  Patient denies any suicidality, homicidality or perceptual disturbances.  Patient continues to be in therapy and reports therapy sessions is beneficial.  Patient recently had esophagogastroduodenoscopy and was diagnosed with grade 2 esophageal varices.  She has follow-up appointment scheduled and she will continue to follow-up with her providers for the same.  Visit Diagnosis:    ICD-10-CM   1. Bipolar 1 disorder, depressed, mild ([place])  F31.31   2. PTSD (post-traumatic stress disorder)  F43.10 QUEtiapine (SEROQUEL XR) 300 MG 24 hr tablet  3. 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 Abnormal uterine bleeding   . Allergy   . Anxiety   . Arthritis   . Bipolar disorder (manic depression) ([place])   . Chronic kidney failure   . Chronic renal disease, stage III ([place])   . Diabetes mellitus without complication ([place])   . DLBCL (diffuse large B  cell lymphoma) ([place]) 2015   Right axillary lymph node resected and chemo tx's.  . FH: trigeminal neuralgia   . GERD (gastroesophageal reflux disease)   . Heart murmur   . Hypertension   . Kidney mass   . Lupus ([place])   . Lupus ([place])   . Major depressive disorder   . 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 Migraine   . Morbid obesity ([place])   . Neuromuscular disorder (HCC)    neuropathy  . Neuropathy   .  Personality disorder ([place])   . Post herpetic neuralgia   . PTSD (post-traumatic stress disorder)   . Renal disorder   . S/P liver transplant [facility])     Past Surgical History:  Procedure Laterality Date  . BONE MARROW BIOPSY  01/13/2015  . BREAST BIOPSY  12/2014  . BREAST BIOPSY  2011  . BREAST SURGERY    . CHOLECYSTECTOMY    . COLONOSCOPY    . ESOPHAGOGASTRODUODENOSCOPY    . ESOPHAGOGASTRODUODENOSCOPY (EGD) WITH PROPOFOL N/A 01/09/2021   Procedure: ESOPHAGOGASTRODUODENOSCOPY (EGD) WITH PROPOFOL;  Surgeon: [name], MD;  Location: [facility] ENDOSCOPY;  Service: Endoscopy;  Laterality: N/A;  . HERNIA REPAIR    . infusaport    . LIVER TRANSPLANT  12/17/1991  . LUMBAR PUNCTURE    . PORT A CATH INJECTION ([place] HX)    . tumor removal  2015    Family Psychiatric History: I have reviewed family psychiatric history from my progress note on 06/10/2019  Family History:  Family History  Problem Relation Age of Onset  . Heart disease Mother   . Hypertension Mother   . Cancer - Other Mother   . Bipolar disorder Mother   . Heart disease Father   . Hypertension Father   . Diabetes Father   . Parkinson's disease Maternal Grandmother   . Cancer Maternal Aunt   . Cancer Maternal Uncle   . Cancer Maternal Grandfather   . Lupus Paternal Grandmother   . Hypertension Brother     Social History: I have reviewed social history from my progress note on 06/10/2019 Social History   Socioeconomic History  . Marital status: Single    Spouse name: Not on file  . Number of children: 0  . Years of education: 9  . Highest education level: GED or equivalent  Occupational History  . Occupation: disabled  Tobacco Use  . Smoking status: Never Smoker  . Smokeless tobacco: Never Used  Vaping Use  . Vaping Use: Never used  Substance and Sexual Activity  . Alcohol use: Not Currently  . Drug use: Not Currently    Types: Marijuana    Comment: pain managment last used in early april  . Sexual activity:  Not Currently  Other Topics Concern  . Not on file  Social History Narrative  . Not on file   Social Determinants of Health   Financial Resource Strain: Not on file  Food Insecurity: Not on file  Transportation Needs: Not on file  Physical Activity: Not on file  Stress: Not on file  Social Connections: Not on file    Allergies:  Allergies  Allergen Reactions  . Penicillin G Itching, Rash, Anaphylaxis and Palpitations  . Lamictal [Lamotrigine] Rash  . Carbamazepine Other (See Comments)    Medication interaction-prograf  . Hydrocodone-Acetaminophen Itching  . Naproxen Itching    Metabolic Disorder Labs: Lab Results  Component Value Date   HGBA1C 8.9 (A) 12/11/2020   MPG 209  06/05/2020   Lab Results  Component Value Date   PROLACTIN 13.4 07/15/2019   PROLACTIN 21.3 05/24/2019   Lab Results  Component Value Date   CHOL 219 (H) 12/11/2020   TRIG 333 (H) 12/11/2020   HDL 38 (L) 12/11/2020   CHOLHDL 5.8 (H) 12/11/2020   LDLCALC 133 (H) 12/11/2020   LDLCALC 111 (H) 06/05/2020   Lab Results  Component Value Date   TSH 2.650 07/15/2019   TSH 4.650 (H) 05/24/2019    Therapeutic Level Labs: No results found for: LITHIUM No results found for: VALPROATE No components found for:  CBMZ  Current Medications: Current Outpatient Medications  Medication Sig Dispense Refill  . baclofen (LIORESAL) 20 MG tablet Take 1 tablet (20 mg total) by mouth 3 (three) times daily as needed for muscle spasms. 90 each 2  . benztropine (COGENTIN) 1 MG tablet Take 1 tablet (1 mg total) by mouth daily as needed for tremors. 30 tablet 1  . Dulaglutide (TRULICITY) 7.90 WI/0.9BD SOPN Inject 0.75 mg into the skin once a week. 3 mL 3  . glucose blood (PRECISION QID TEST) test strip Use once daily Use as instructed.    . Lidocaine 0.5 % AERO Apply topically.    . magnesium oxide (MAG-OX) 400 MG tablet Take by mouth.    . Melatonin 5 MG CAPS Take 1-2 capsules (5-10 mg total) by mouth at bedtime.  100 capsule 0  . metFORMIN (GLUCOPHAGE) 1000 MG tablet Take 1 tablet (1,000 mg total) by mouth 2 (two) times daily with a meal. 180 tablet 3  . mirabegron ER (MYRBETRIQ) 50 MG TB24 tablet Take 1 tablet (50 mg total) by mouth daily. 90 tablet 3  . Multiple Vitamin (MULTI-VITAMIN) tablet Take 1 tablet by mouth daily.    [name] oxyCODONE ER 13.5 MG C12A Take 1 capsule by mouth 2 (two) times daily.    [name] PARoxetine (PAXIL) 10 MG tablet Take 1 tablet (10 mg total) by mouth daily. 30 tablet 1  . pregabalin (LYRICA) 200 MG capsule Take 1 capsule (200 mg total) by mouth 3 (three) times daily. 90 capsule 2  . promethazine (PHENERGAN) 25 MG tablet Take 1 tablet (25 mg total) by mouth as needed. 15 tablet 0  . QUEtiapine (SEROQUEL XR) 300 MG 24 hr tablet Take 1 tablet (300 mg total) by mouth at bedtime. 90 tablet 0  . rosuvastatin (CRESTOR) 5 MG tablet Take 1 tablet (5 mg total) by mouth daily. 90 tablet 3  . SUMAtriptan (IMITREX) 100 MG tablet TAKE 1 TABLET BY MOUTH ONCE AS NEEDED FOR MIGRAINE. MAY TAKE A SECOND DOSE AFTER 2 HOURS IF NEEDED    . tacrolimus (PROGRAF) 1 MG capsule Take 1 capsule by mouth once daily 30 capsule 0   No current facility-administered medications for this visit.     Musculoskeletal: Strength & Muscle Tone: UTA Gait & Station: UTA Patient leans: N/A  Psychiatric Specialty Exam: Review of Systems  Musculoskeletal: Positive for myalgias.  Psychiatric/Behavioral: Positive for dysphoric mood. The patient is nervous/anxious.   All other systems reviewed and are negative.   Last menstrual period 02/04/2018.There is no height or weight on file to calculate BMI.  General Appearance: Casual  Eye Contact:  Fair  Speech:  Normal Rate  Volume:  Normal  Mood:  Anxious and Depressed coping well  Affect:  Congruent  Thought Process:  Goal Directed and Descriptions of Associations: Intact  Orientation:  Full (Time, Place, and Person)  Thought Content: Logical   Suicidal  Thoughts:  No   Homicidal Thoughts:  No  Memory:  Immediate;   Fair Recent;   Fair Remote;   Fair  Judgement:  Fair  Insight:  Fair  Psychomotor Activity:  Normal  Concentration:  Concentration: Fair and Attention Span: Fair  Recall:  [employer] of Knowledge: Fair  Language: Fair  Akathisia:  No  Handed:  Right  AIMS (if indicated): UTA  Assets:  Communication Skills Desire for Improvement Housing Social Support  ADL's:  Intact  Cognition: WNL  Sleep:  Fair   Screenings: GAD-7   Flowsheet Row [facility] Visit [facility] 12/04/2020 in [facility]  Total GAD-7 Score 5    PHQ2-9   Flowsheet Row Video Visit from 12/19/2020 in [facility] Office Visit from 12/11/2020 in [facility] Office Visit from 12/04/2020 in [facility] Video Visit from 11/20/2020 in [facility] Office Visit from 10/17/2020 in [facility]  PHQ-2 Total Score 1 3 6  0 4  PHQ-9 Total Score -- 5 17 -- 10    Flowsheet Row Admission (Discharged) from 01/09/2021 in [facility] Video Visit from 12/19/2020 in [facility] Video Visit from 11/20/2020 in [facility] No Risk Low Risk No Risk       Assessment and Plan: Carolann Brazell is a 45 year old Caucasian female on disability, has a history of bipolar disorder, PTSD, history of borderline personality disorder, chronic pain, fibromyalgia, trigeminal neuralgia, history of liver transplant, B-cell lymphoma, SLE, diabetes melitis, OSA, migraine headaches was evaluated by telemedicine today.  Patient is currently struggling with anxiety symptoms, will continue to benefit from medications and psychotherapy sessions.  Plan as noted below.  Plan Bipolar disorder depressed-improving Seroquel extended release 300 mg p.o. nightly-long-term plan is to taper off  Seroquel since she has abnormal liver enzymes. Continue Paxil 10 mg p.o. daily  PTSD-improving Paxil 10 mg p.o. daily Hydroxyzine 25 mg p.o. twice daily as needed for anxiety attacks Melatonin 5 to 10 mg p.o. nightly Continue CBT with therapist  Neuroleptic induced Parkinson's disease-stable Cogentin 1 mg p.o. daily as needed.  She has been limiting use.  I have reviewed notes per-Dr. Allen Norris -patient had esophagogastroduodenoscopy-dated 01/09/2021-patient has esophageal varices.  Patient to continue to follow-up with pain management for management of her fibromyalgia pain.  Follow-up in clinic in 4 weeks or sooner if needed.  This note was generated in part or whole with voice recognition software. Voice recognition is usually quite accurate but there are transcription errors that can and very often do occur. I apologize for any typographical errors that were not detected and corrected.      [name], MD 01/11/2021, 12:21 PM

## 2021-01-11 ENCOUNTER — Encounter: Payer: Self-pay | Admitting: Family Medicine

## 2021-01-15 ENCOUNTER — Other Ambulatory Visit: Payer: Self-pay

## 2021-01-15 ENCOUNTER — Ambulatory Visit
Admission: RE | Admit: 2021-01-15 | Discharge: 2021-01-15 | Disposition: A | Discharge status: Home / Self Care | Payer: Medicaid Other | Source: Ambulatory Visit | Attending: Physician Assistant | Admitting: Physician Assistant

## 2021-01-15 DIAGNOSIS — N12 Tubulo-interstitial nephritis, not specified as acute or chronic: Secondary | ICD-10-CM | POA: Diagnosis not present

## 2021-01-15 DIAGNOSIS — N10 Acute tubulo-interstitial nephritis: Secondary | ICD-10-CM | POA: Diagnosis not present

## 2021-01-15 DIAGNOSIS — K7689 Other specified diseases of liver: Secondary | ICD-10-CM | POA: Diagnosis not present

## 2021-01-15 DIAGNOSIS — N2889 Other specified disorders of kidney and ureter: Secondary | ICD-10-CM | POA: Diagnosis not present

## 2021-01-15 DIAGNOSIS — Z944 Liver transplant status: Secondary | ICD-10-CM | POA: Diagnosis not present

## 2021-01-15 MED ORDER — IOHEXOL 300 MG/ML  SOLN
100.0000 mL | Freq: Once | INTRAMUSCULAR | Status: AC | PRN
  Administered 2021-01-15: 100 mL via INTRAVENOUS

## 2021-01-15 NOTE — Patient Outreach (Signed)
Medicaid Managed Care Social Work Note  01/15/2021 Name:  Alison [name] MRN:  [id number] DOB:  1976-02-25  Alison [name] is an 45 y.o. year old female who is a primary patient of [facility].  The [facility] Managed Care Coordination team was consulted for assistance with:  follow up  [name] was given information about Medicaid Managed CareCoordination services today. Alison [name] agreed to services and verbal consent obtained.  Engaged with patient  for by telephone forfollow up visit in response to referral for case management and/or care coordination services.   Assessments/Interventions:  Review of past medical history, allergies, medications, health status, including review of consultants reports, laboratory and other test data, was performed as part of comprehensive evaluation and provision of chronic care management services.  SDOH: (Social Determinant of Health) assessments and interventions performed:  . [name] completed a follow up with patient. She stated she found out she has to have a liver transplant and is currently completing testing. Patient stated that she has been in pain since her pcp took her off of her pain medication. Patient stated her rent will be going up by $400 and they have to move. They will most likely move to [place] area because her fiance' works in that area. No resources needed at this time.    Advanced Directives Status:  Not addressed in this encounter.  Care Plan                 Allergies  Allergen Reactions  . Penicillin G Itching, Rash, Anaphylaxis and Palpitations  . Lamictal [Lamotrigine] Rash  . Carbamazepine Other (See Comments)    Medication interaction-prograf  . Hydrocodone-Acetaminophen Itching  . Naproxen Itching    Medications Reviewed Today    Reviewed by Alison Alert, MD (Physician) on 01/10/21 at 1651  Med List Status: <None>  Medication Order Taking? Sig Documenting Provider Last Dose Status  Informant  baclofen (LIORESAL) 20 MG tablet [id number] No Take 1 tablet (20 mg total) by mouth 3 (three) times daily as needed for muscle spasms. Alison Hartshorn, FNP 01/08/2021 Unknown time Active   benztropine (COGENTIN) 1 MG tablet [id number] No Take 1 tablet (1 mg total) by mouth daily as needed for tremors. Alison Alert, MD Taking Active   Dulaglutide (TRULICITY) 3.66 YQ/0.3KV SOPN [id number] No Inject 0.75 mg into the skin once a week. Alison Gip, DO 01/08/2021 Active   DULoxetine (CYMBALTA) 30 MG capsule [id number] No Take 1 capsule (30 mg total) by mouth daily. Stop taking after a week [name], [name], MD Taking Active   glucose blood (PRECISION QID TEST) test strip [id number] No Use once daily Use as instructed. [provider] Taking Active   Lidocaine 0.5 % AERO [id number] No Apply topically. [provider] Taking Active   magnesium oxide (MAG-OX) 400 MG tablet [id number] No Take by mouth. [provider] 01/05/2021 Unknown time Active   Melatonin 5 MG CAPS [id number] No Take 1-2 capsules (5-10 mg total) by mouth at bedtime. Alison Alert, MD 01/08/2021 Unknown time Active   metFORMIN (GLUCOPHAGE) 1000 MG tablet [id number] No Take 1 tablet (1,000 mg total) by mouth 2 (two) times daily with a meal. Alison Gip, DO 01/08/2021 Unknown time Active   mirabegron ER (MYRBETRIQ) 50 MG TB24 tablet [id number] No Take 1 tablet (50 mg total) by mouth daily. Alison Loop, PA-C 01/08/2021 Unknown time Active   Multiple Vitamin (MULTI-VITAMIN) tablet [id number] No Take 1 tablet by mouth daily. [provider] 01/05/2021 Active  oxyCODONE ER 13.5 MG C12A [id number] No Take 1 capsule by mouth 2 (two) times daily. [provider] 01/08/2021 Unknown time Active   PARoxetine (PAXIL) 10 MG tablet [id number] No Take 1 tablet (10 mg total) by mouth daily. Alison Alert, MD 01/08/2021 Unknown time Active   pregabalin (LYRICA) 200 MG capsule [id number] No Take 1 capsule  (200 mg total) by mouth 3 (three) times daily. Alison Santa, MD Taking Expired 06/06/20 2359            Med Note ([name], [name]   Wed Sep 06, 2020  2:51 PM) Taking  promethazine (PHENERGAN) 25 MG tablet [id number] No Take 1 tablet (25 mg total) by mouth as needed. Alison Gip, DO Taking Active   QUEtiapine (SEROQUEL XR) 300 MG 24 hr tablet [id number] No Take 1 tablet (300 mg total) by mouth at bedtime. Alison Alert, MD 01/08/2021 Unknown time Active   rosuvastatin (CRESTOR) 5 MG tablet [id number] No Take 1 tablet (5 mg total) by mouth daily. Alison Gip, DO Taking Active   SUMAtriptan (IMITREX) 100 MG tablet [id number] No TAKE 1 TABLET BY MOUTH ONCE AS NEEDED FOR MIGRAINE. MAY TAKE A SECOND DOSE AFTER 2 HOURS IF NEEDED [provider] Taking Active   tacrolimus (PROGRAF) 1 MG capsule [id number] No Take 1 capsule by mouth once daily Alison Hartshorn, FNP 01/08/2021 Unknown time Active            Med Note Alison [name] Sep 11, 2020 10:48 AM) AM          Patient Active Problem List   Diagnosis Date Noted  . Hepatic cirrhosis (Alison [name])   . Opioid dependence with opioid-induced disorder (Alison [name]) 12/20/2020  . 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 Bipolar disorder, in full remission, most recent episode depressed ([place]) 09/27/2020  . Suprapubic pain 07/25/2020  . Bipolar 1 disorder, depressed, mild ([place]) 07/10/2020  . Neuroleptic-induced parkinsonism ([place]) 11/12/2019  . Edema of lower extremity 09/16/2019  . Carpal tunnel syndrome, left 07/26/2019  . Cubital tunnel syndrome on left 07/26/2019  . Bipolar I disorder, most recent episode depressed ([place]) 07/12/2019  . PTSD (post-traumatic stress disorder) 07/12/2019  . Umbilical hernia without obstruction and without gangrene 06/17/2019  . Encounter for surveillance of abnormal nevi 06/17/2019  . Pineal gland cyst 06/17/2019  . Chronic hip pain, left 06/02/2019  . Lumbar spondylosis 06/02/2019   . Mouth dryness 06/02/2019  . Obesity (BMI 35.0-39.9 without comorbidity) 06/02/2019  . Goals of care, counseling/discussion 05/18/2019  . 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[place]) 05/18/2019  . Occipital neuralgia 05/13/2019  . Plantar fasciitis of left foot 04/27/2019  . Fibromyalgia 03/23/2019  . Systemic lupus erythematosus (SLE) in adult ([place]) 03/23/2019  . Essential hypertension 03/23/2019  . Hepatitis 03/23/2019  . Mass of left kidney 03/23/2019  . Diabetes mellitus ([place]) 03/23/2019  . Nausea without vomiting 03/23/2019  . Neuropathy 03/23/2019  . Cancer ([place]) 03/23/2019  . Hyperlipidemia 03/23/2019  . Osteoarthritis 03/23/2019  . Trigeminal neuralgia 03/23/2019  . Chronic renal disease, stage III ([place]) 03/23/2019  . Nephrolithiasis 03/23/2019  . Migraine 03/23/2019  . OSA on CPAP 03/23/2019  . Fibrocystic breast 03/23/2019  . Heart murmur 03/23/2019  . Bipolar disorder, in partial remission, most recent episode depressed ([place]) 07/02/2017  . Abnormal uterine bleeding 03/18/2017  . Major depressive disorder, recurrent ([place]) 03/18/2017  . Morbid obesity ([place]) 03/18/2017  .  Secondary hyperparathyroidism ([place]) 02/07/2017  . Neuropathic pain 11/19/2016  . Post herpetic neuralgia 11/19/2016  . 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[place]) 01/13/2015  . Lumbar disc disease with radiculopathy 07/26/2013  . S/P liver transplant ([place]) 07/26/2013  . Type 2 diabetes mellitus, uncontrolled ([place]) 07/26/2013  . Facial nerve disorder 06/29/2012  . Low back pain 12/26/2010    Conditions to be addressed/monitored per PCP order:  muscle pain  Care Plan : Chronic Pain (Adult)  Updates made by Ethelda Chick since 01/15/2021 12:00 AM    Problem: Chronic Pain Management-fibromyalgia     Long-Range Goal: Fibromyalgia pain managed-new pain management provider   Start Date: 08/14/2020   Expected End Date: 02/21/2021  Recent Progress: On track  Priority: High  Note:   Current Barriers:  . Care Coordination needs related to pain management provider.   Patient is experiencing fibromyalgia flair and is interested in help being connected to a pain management provider. . Unable to independently secure referral or appointment for pain management provider. [name] Update 11/24/20:  Patient is being followed by Dr. Humphrey Rolls.  Nurse Case Manager Clinical Goal(s):  [name], patient will work with [facility] to address needs related to referral for pain management provider and associated care coordination needs. [name] Update 09/22/20:  patient is currently seeing Dr. Humphrey Rolls at [facility].  Interventions:  . Inter-disciplinary care team collaboration (see longitudinal plan of care) . Evaluation of current treatment plan related to fibromyalgia  and patient's adherence to plan as established by provider. [name] with primary care provider regarding recommendations and referral to pain management provider. . Discussed plans with patient for ongoing care management follow up and provided patient with direct contact information for care management team . Anticipate pain education program, pain management support as part of pain management referral.      . [name] contacted patient to check in. Patient stated she has not contacted an eye doctor nor has she seen a specialist. Patient states she thinks she has COVID due to her husband having it. She has not taken a test yet, but has some symptoms. She is isolating in the bedroom and they have someone to cook and bring her food.       [name] Update 11/16/20: [name] completed phone call with patient, she states she is still having symptoms from [place] and has been in bed all month. Patient states she has not had a chance to contact an eye doctor or specialist due to being sick. [name] Update 11/24/20:  Patient with continued fatigue-will follow  up with PCP. [name] Pharmacy referral for medication review. [name] Update 12/14/20: [name] completed call with patient, she stated she is waiting on a call from [facility] about getting a liver biopsy completed. She has not reached out to the eye doctor yet because her liver is top priority right now. [name] Update 12/22/20:  Patient seen by PCP 12/11/20.  Has not seen Opthamologist yet.  Had liver biopsy 12/20/20 due to increased liver enzymes.  [name] Update 01/15/21: [name] completed a follow up with patient. She stated she found out she has to have a liver transplant and is currently completing testing. Patient stated that she has been in pain since her pcp took her off of her pain medication. Patient stated her rent will be going up by $400 and they have to move. They will most likely move to [place] area because her fiance' works in that area. No resources  needed at this time.  Patient Goals/Self-Care Activities Over the next 45 days, patient will:  -Attends all scheduled provider appointments  develop a personal pain management plan with your pain management provider.  Follow Up Plan:  The Managed Medicaid care management team will reach out to the patient again over the next 30 days.                Follow up:  Patient agrees to Care Plan and Follow-up.  Plan: The patient has been provided with contact information for the Managed Medicaid care management team and has been advised to call with any health related questions or concerns.    Mickel Fuchs, [name], [place]  High Risk Managed Medicaid Team

## 2021-01-15 NOTE — Patient Instructions (Signed)
Visit Information  Ms. Hada was given information about Medicaid Managed Care team care coordination services as a part of their Healthy [facility] Medicaid benefit. Lexis Potenza verbally consented to engagement with the [facility] Managed Care team.   For questions related to your Healthy [facility] [facility] health plan, please call: [phone] or visit the homepage here: [email or web address]  If you would like to schedule transportation through your Healthy [facility] plan, please call the following number at least 2 days in advance of your appointment: [phone]   Call the [facility] at [phone], at any time, 24 hours a day, 7 days a week. If you are in danger or need immediate medical attention call 911.  Ms. Gaertner - following are the goals we discussed in your visit today:  Goals Addressed   None     The patient has been provided with contact information for the Managed Medicaid care management team and has been advised to call with any health related questions or concerns.   Mickel Fuchs, [name], [place]  High Risk Managed Medicaid Team    Following is a copy of your plan of care:

## 2021-01-16 ENCOUNTER — Ambulatory Visit: Payer: Medicaid Other | Admitting: Physician Assistant

## 2021-01-16 NOTE — Progress Notes (Signed)
   01/17/2021   CC:  Chief Complaint  Patient presents with  . Cysto    HPI: Alison [name] is a 45 y.o. female with a personal history of recurrent UTIs, OAB, and renal masses who presents today for a cystosopic evaluation.   Patient was seen on 12/22/2020 by her PCP for dysuria, increased urgency, and malordorous urine x2 weeks. She had already been prescribed Macrobid, but her urinary symptoms returned within 2 days. Patient was prescribed 500 mg of Cipro daily for 3 days.  Patient reported that she has bee getting UTIs on and off for the last year.  Patient states that OAB symptoms have been relieved by medication.   Blood pressure (!) 139/94, pulse 85, last menstrual period 02/04/2018. NED. A&Ox3.   No respiratory distress   Abd soft, NT, ND  Cystoscopy Procedure Note  Patient identification was confirmed, informed consent was obtained, and patient was prepped using Betadine solution.  Lidocaine jelly was administered per urethral meatus.     Pre-Procedure: - Inspection reveals a normal caliber ureteral meatus.  Procedure: The flexible cystoscope was introduced without difficulty - No urethral strictures/lesions are present. - Normal bladder neck - Bilateral ureteral orifices identified - Bladder mucosa  reveals no ulcers, tumors, or lesions - No bladder stones - No trabeculation  Retroflexion is unremarkable.   Post-Procedure: - Patient tolerated the procedure well   Assessment/ Plan:  1. Recurrent UTIs Continue myrbetric  CTU showed no renal masses, scarring present from previous UTI  Given history of recurrent infections, she was given a prophylactic Bactrim today at the time of cystoscopy, advised to return if she has any signs or symptoms of UTI post procedurally   Follow Up:  Follow up in 3 months with urinary symptoms  I, Ardyth Gal, am acting as a scribe for Dr. Hollice Espy.   I have reviewed the above documentation for accuracy and  completeness, and I agree with the above.   Hollice Espy, MD

## 2021-01-17 ENCOUNTER — Ambulatory Visit (INDEPENDENT_AMBULATORY_CARE_PROVIDER_SITE_OTHER): Payer: Medicaid Other | Admitting: Urology

## 2021-01-17 ENCOUNTER — Other Ambulatory Visit: Payer: Self-pay

## 2021-01-17 VITALS — BP 139/94 | HR 85

## 2021-01-17 DIAGNOSIS — N390 Urinary tract infection, site not specified: Secondary | ICD-10-CM

## 2021-01-17 MED ORDER — SULFAMETHOXAZOLE-TRIMETHOPRIM 800-160 MG PO TABS
1.0000 | ORAL_TABLET | Freq: Two times a day (BID) | ORAL | Status: DC
  Administered 2021-01-17: 1 via ORAL

## 2021-01-18 ENCOUNTER — Emergency Department
Admission: EM | Admit: 2021-01-18 | Discharge: 2021-01-19 | Disposition: A | Discharge status: Home / Self Care | Payer: Medicaid Other | Attending: Emergency Medicine | Admitting: Emergency Medicine

## 2021-01-18 ENCOUNTER — Other Ambulatory Visit: Payer: Self-pay

## 2021-01-18 ENCOUNTER — Encounter: Payer: Self-pay | Admitting: Urology

## 2021-01-18 DIAGNOSIS — Z7984 Long term (current) use of oral hypoglycemic drugs: Secondary | ICD-10-CM | POA: Insufficient documentation

## 2021-01-18 DIAGNOSIS — N183 Chronic kidney disease, stage 3 unspecified: Secondary | ICD-10-CM | POA: Diagnosis not present

## 2021-01-18 DIAGNOSIS — Z8572 Personal history of non-Hodgkin lymphomas: Secondary | ICD-10-CM | POA: Diagnosis not present

## 2021-01-18 DIAGNOSIS — R300 Dysuria: Secondary | ICD-10-CM | POA: Diagnosis not present

## 2021-01-18 DIAGNOSIS — I129 Hypertensive chronic kidney disease with stage 1 through stage 4 chronic kidney disease, or unspecified chronic kidney disease: Secondary | ICD-10-CM | POA: Insufficient documentation

## 2021-01-18 DIAGNOSIS — N3001 Acute cystitis with hematuria: Secondary | ICD-10-CM | POA: Insufficient documentation

## 2021-01-18 DIAGNOSIS — G2111 Neuroleptic induced parkinsonism: Secondary | ICD-10-CM | POA: Diagnosis not present

## 2021-01-18 DIAGNOSIS — E1122 Type 2 diabetes mellitus with diabetic chronic kidney disease: Secondary | ICD-10-CM | POA: Diagnosis not present

## 2021-01-18 DIAGNOSIS — E1140 Type 2 diabetes mellitus with diabetic neuropathy, unspecified: Secondary | ICD-10-CM | POA: Diagnosis not present

## 2021-01-18 LAB — URINALYSIS, ROUTINE W REFLEX MICROSCOPIC
Bilirubin Urine: NEGATIVE
Glucose, UA: 50 mg/dL — AB
Ketones, ur: NEGATIVE mg/dL
Nitrite: NEGATIVE
Protein, ur: >=300 mg/dL — AB
RBC / HPF: >50 RBC/hpf — ABNORMAL HIGH (ref 0–5)
Specific Gravity, Urine: 1.019 (ref 1.005–1.030)
WBC, UA: >50 WBC/hpf — ABNORMAL HIGH (ref 0–5)
pH: 6 (ref 5.0–8.0)

## 2021-01-18 LAB — MICROSCOPIC EXAMINATION

## 2021-01-18 LAB — URINALYSIS, COMPLETE
Bilirubin, UA: NEGATIVE
Glucose, UA: NEGATIVE
Ketones, UA: NEGATIVE
Leukocytes,UA: NEGATIVE
Nitrite, UA: NEGATIVE
RBC, UA: NEGATIVE
Specific Gravity, UA: 1.015 (ref 1.005–1.030)
Urobilinogen, Ur: 0.2 mg/dL (ref 0.2–1.0)
pH, UA: 7 (ref 5.0–7.5)

## 2021-01-18 MED ORDER — FENTANYL CITRATE (PF) 100 MCG/2ML IJ SOLN
50.0000 ug | Freq: Once | INTRAMUSCULAR | Status: AC
  Administered 2021-01-19: 50 ug via INTRAVENOUS
  Filled 2021-01-18: qty 2

## 2021-01-18 MED ORDER — SODIUM CHLORIDE 0.9 % IV SOLN
1.0000 g | INTRAVENOUS | Status: DC
  Administered 2021-01-19: 1 g via INTRAVENOUS
  Filled 2021-01-18: qty 10

## 2021-01-18 MED ORDER — ONDANSETRON HCL 4 MG/2ML IJ SOLN
4.0000 mg | Freq: Once | INTRAMUSCULAR | Status: AC
  Administered 2021-01-19: 4 mg via INTRAVENOUS
  Filled 2021-01-18: qty 2

## 2021-01-18 MED ORDER — SODIUM CHLORIDE 0.9 % IV BOLUS
1000.0000 mL | Freq: Once | INTRAVENOUS | Status: AC
  Administered 2021-01-19: 1000 mL via INTRAVENOUS

## 2021-01-18 NOTE — ED Provider Notes (Signed)
[facility] Emergency Department Provider Note   ____________________________________________   Event Date/Time   First MD Initiated Contact with Patient 01/18/21 2306     (approximate)  I have reviewed the triage vital signs and the nursing notes.   HISTORY  Chief Complaint Dysuria    HPI Alison [name] is a 45 y.o. female who presents to the ED from home with a chief complaint of dysuria, bladder pain.  Patient had cystoscopy yesterday by Dr. Erlene Quan for recurrent UTIs.  Patient states her cystoscopy was unremarkable.  Had 1 Bactrim after the procedure.  Prior to that she was treated with Cipro for most recent UTI approximately 2 weeks ago.  Presents with dysuria and bladder pain.  Denies fever but complains of chills and nausea.  Denies cough, chest pain, shortness of breath, vomiting or diarrhea.     Past Medical History:  Diagnosis Date  . Abnormal uterine bleeding   . Allergy   . Anxiety   . Arthritis   . Bipolar disorder (manic depression) ([place])   . Chronic kidney failure   . Chronic renal disease, stage III ([place])   . Diabetes mellitus without complication ([employer])   . 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  . FH: trigeminal neuralgia   . GERD (gastroesophageal reflux disease)   . Heart murmur   . Hypertension   . Kidney mass   . Lupus ([place])   . Lupus ([place])   . Major depressive disorder   . 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 Migraine   . Morbid obesity ([place])   . Neuromuscular disorder (HCC)    neuropathy  . Neuropathy   . Personality disorder ([place])   . Post herpetic neuralgia   . PTSD (post-traumatic stress disorder)   . Renal disorder   . S/P liver transplant [facility])     Patient Active Problem List   Diagnosis Date Noted  . Hepatic cirrhosis ([place])   . Opioid dependence with opioid-induced disorder ([place]) 12/20/2020  . 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 Bipolar disorder, in full remission, most recent episode depressed ([place]) 09/27/2020  . Suprapubic pain 07/25/2020  . Bipolar 1 disorder, depressed, mild ([place]) 07/10/2020  . 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 Edema of lower extremity 09/16/2019  . Carpal tunnel syndrome, left 07/26/2019  . Cubital tunnel syndrome on left 07/26/2019  . Bipolar I disorder, most recent episode depressed ([place]) 07/12/2019  . PTSD (post-traumatic stress disorder) 07/12/2019  . Umbilical hernia without obstruction and without gangrene 06/17/2019  . Encounter for surveillance of abnormal nevi 06/17/2019  . Pineal gland cyst 06/17/2019  . Chronic hip pain, left 06/02/2019  . Lumbar spondylosis 06/02/2019  . Mouth dryness 06/02/2019  . Obesity (BMI 35.0-39.9 without comorbidity) 06/02/2019  . Goals of care, counseling/discussion 05/18/2019  . 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 Occipital neuralgia 05/13/2019  . Plantar fasciitis of left foot 04/27/2019  . Fibromyalgia 03/23/2019  . Systemic lupus erythematosus (SLE) in adult ([place]) 03/23/2019  . Essential hypertension 03/23/2019  . Hepatitis 03/23/2019  . Mass of left kidney 03/23/2019  . Diabetes mellitus ([place]) 03/23/2019  . Nausea without vomiting 03/23/2019  . Neuropathy 03/23/2019  . Cancer ([place]) 03/23/2019  . Hyperlipidemia 03/23/2019  . Osteoarthritis 03/23/2019  . Trigeminal neuralgia 03/23/2019  .  Chronic renal disease, stage III ([place]) 03/23/2019  . Nephrolithiasis 03/23/2019  . Migraine 03/23/2019  . OSA on CPAP 03/23/2019  . Fibrocystic breast 03/23/2019  . Heart murmur 03/23/2019  . Bipolar disorder, in partial remission, most recent episode depressed ([place]) 07/02/2017  . Abnormal uterine bleeding 03/18/2017  . Major depressive disorder, recurrent ([place])  03/18/2017  . Morbid obesity ([place]) 03/18/2017  . Secondary hyperparathyroidism ([place]) 02/07/2017  . Neuropathic pain 11/19/2016  . Post herpetic neuralgia 11/19/2016  . 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[place]) 01/13/2015  . Lumbar disc disease with radiculopathy 07/26/2013  . S/P liver transplant ([place]) 07/26/2013  . Type 2 diabetes mellitus, uncontrolled ([place]) 07/26/2013  . Facial nerve disorder 06/29/2012  . Low back pain 12/26/2010    Past Surgical History:  Procedure Laterality Date  . BONE MARROW BIOPSY  01/13/2015  . BREAST BIOPSY  12/2014  . BREAST BIOPSY  2011  . BREAST SURGERY    . CHOLECYSTECTOMY    . COLONOSCOPY    . ESOPHAGOGASTRODUODENOSCOPY    . ESOPHAGOGASTRODUODENOSCOPY (EGD) WITH PROPOFOL N/A 01/09/2021   Procedure: ESOPHAGOGASTRODUODENOSCOPY (EGD) WITH PROPOFOL;  Surgeon: [name], MD;  Location[facility] ENDOSCOPY;  Service: Endoscopy;  Laterality: N/A;  . HERNIA REPAIR    . infusaport    . LIVER TRANSPLANT  12/17/1991  . LUMBAR PUNCTURE    . PORT A CATH INJECTION ([place] [facility])    . tumor removal  2015    Prior to Admission medications   Medication Sig Start Date End Date Taking? Authorizing Provider  baclofen (LIORESAL) 20 MG tablet Take 1 tablet (20 mg total) by mouth 3 (three) times daily as needed for muscle spasms. Patient taking differently: Take 20 mg by mouth 3 (three) times daily. 09/16/19  Yes [name], FNP  benztropine (COGENTIN) 1 MG tablet Take 1 tablet (1 mg total) by mouth daily as needed for tremors. 11/20/20  Yes [name], MD  cephALEXin (KEFLEX) 500 MG capsule Take 1 capsule (500 mg total) by mouth 3 (three) times daily. 01/19/21  Yes Paulette Blanch, MD  Dulaglutide (TRULICITY) 9.79 GX/2.1JH SOPN Inject 0.75 mg into the skin once a week. Patient taking differently: Inject 0.75 mg into the skin every Wednesday. 12/11/20  Yes Myles Gip, DO  Melatonin 10 MG TABS Take 10 mg by mouth at bedtime as needed (sleep).   Yes [provider]  metFORMIN (GLUCOPHAGE) 1000 MG tablet Take 1 tablet (1,000 mg total) by mouth 2 (two) times daily with a meal. 12/11/20  Yes Rumball, Jake Church, DO  mirabegron ER (MYRBETRIQ) 50 MG TB24 tablet Take 1 tablet (50 mg total) by mouth daily. 12/22/20  Yes Vaillancourt, Samantha, PA-C  Multiple Vitamin (MULTI-VITAMIN) tablet Take 1 tablet by mouth daily.   Yes [provider]  ondansetron (ZOFRAN ODT) 4 MG disintegrating tablet Take 1 tablet (4 mg total) by mouth every 8 (eight) hours as needed for nausea or vomiting. 01/19/21  Yes Paulette Blanch, MD  oxyCODONE ER 13.5 MG C12A Take 13.5 mg by mouth 2 (two) times daily.   Yes [provider]  oxyCODONE-acetaminophen (PERCOCET/ROXICET) 5-325 MG tablet Take 1 tablet by mouth every 6 (six) hours as needed for severe pain. 01/19/21  Yes Paulette Blanch, MD  PARoxetine (PAXIL) 10 MG tablet Take 1 tablet (10 mg total) by mouth daily. 12/19/20  Yes [name], MD  phenazopyridine (PYRIDIUM) 200 MG tablet Take 1 tablet (200 mg total) by mouth 3 (three) times daily  as needed for pain. 01/19/21  Yes Paulette Blanch, MD  QUEtiapine (SEROQUEL XR) 300 MG 24 hr tablet Take 1 tablet (300 mg total) by mouth at bedtime. 01/10/21  Yes [name], MD  tacrolimus (PROGRAF) 1 MG capsule Take 1 capsule by mouth once daily Patient taking differently: Take 1 mg by mouth 2 (two) times daily. 07/08/19  Yes [name], FNP  promethazine (PHENERGAN) 25 MG tablet Take 1 tablet (25 mg total) by mouth as needed. Patient not taking: No sig reported 12/04/20   Myles Gip, DO    Allergies Penicillin g, Lamictal [lamotrigine], Carbamazepine, Hydrocodone-acetaminophen, and Naproxen  Family History  Problem Relation Age of Onset  . Heart disease Mother   . Hypertension Mother   . Cancer - Other Mother   . Bipolar disorder Mother   . Heart disease Father   . Hypertension Father   . Diabetes Father   . Parkinson's disease Maternal Grandmother   .  Cancer Maternal Aunt   . Cancer Maternal Uncle   . Cancer Maternal Grandfather   . Lupus Paternal Grandmother   . Hypertension Brother     Social History Social History   Tobacco Use  . Smoking status: Never Smoker  . Smokeless tobacco: Never Used  Vaping Use  . Vaping Use: Never used  Substance Use Topics  . Alcohol use: Not Currently  . Drug use: Not Currently    Types: Marijuana    Comment: pain managment last used in early april    Review of Systems  Constitutional: Positive for chills Eyes: No visual changes. ENT: No sore throat. Cardiovascular: Denies chest pain. Respiratory: Denies shortness of breath. Gastrointestinal: Positive for suprapubic abdominal pain.  Positive for nausea, no vomiting.  No diarrhea.  No constipation. Genitourinary: Positive for dysuria. Musculoskeletal: Negative for back pain. Skin: Negative for rash. Neurological: Negative for headaches, focal weakness or numbness.   ____________________________________________   PHYSICAL EXAM:  VITAL SIGNS: ED Triage Vitals  Enc Vitals Group     BP 01/18/21 1902 (!) 136/99     Pulse Rate 01/18/21 1902 94     Resp 01/18/21 1902 20     Temp 01/18/21 1902 98.4 F (36.9 C)     Temp Source 01/18/21 1902 Oral     SpO2 01/18/21 1902 94 %     Weight 01/18/21 1902 265 lb (120.2 kg)     Height 01/18/21 1902 5' 6"  (1.676 m)     Head Circumference --      Peak Flow --      Pain Score 01/18/21 1906 7     Pain Loc --      Pain Edu? --      Excl. in [place]? --     Constitutional: Alert and oriented. Well appearing and in mild acute distress. Eyes: Conjunctivae are normal. PERRL. EOMI. Head: Atraumatic. Nose: No congestion/rhinnorhea. Mouth/Throat: Mucous membranes are mildly dry. Neck: No stridor.   Cardiovascular: Normal rate, regular rhythm. Grossly normal heart sounds.  Good peripheral circulation. Respiratory: Normal respiratory effort.  No retractions. Lungs CTAB. Gastrointestinal: Soft and  minimally tender to palpation suprapubic area without rebound or guarding. No distention. No abdominal bruits. No CVA tenderness. Musculoskeletal: No lower extremity tenderness nor edema.  No joint effusions. Neurologic:  Normal speech and language. No gross focal neurologic deficits are appreciated. No gait instability. Skin:  Skin is warm, dry and intact. No rash noted. Psychiatric: Mood and affect are normal. Speech and behavior are normal.  ____________________________________________   LABS (all labs ordered are listed, but only abnormal results are displayed)  Labs Reviewed  URINALYSIS, ROUTINE W REFLEX MICROSCOPIC - Abnormal; Notable for the following components:      Result Value   Color, Urine YELLOW (*)    APPearance CLOUDY (*)    Glucose, UA 50 (*)    Hgb urine dipstick SMALL (*)    Protein, ur >=300 (*)    Leukocytes,Ua LARGE (*)    RBC / HPF >50 (*)    WBC, UA >50 (*)    Bacteria, UA MANY (*)    All other components within normal limits  LACTIC ACID, PLASMA - Abnormal; Notable for the following components:   Lactic Acid, Venous 2.1 (*)    All other components within normal limits  CBC WITH DIFFERENTIAL/PLATELET - Abnormal; Notable for the following components:   Platelets 127 (*)    All other components within normal limits  BASIC METABOLIC PANEL - Abnormal; Notable for the following components:   Glucose, Bld 212 (*)    Creatinine, Ser 1.01 (*)    All other components within normal limits  LACTIC ACID, PLASMA - Abnormal; Notable for the following components:   Lactic Acid, Venous 2.0 (*)    All other components within normal limits  CULTURE, BLOOD (ROUTINE X 2)  CULTURE, BLOOD (ROUTINE X 2)  URINE CULTURE   ____________________________________________  EKG  None ____________________________________________  RADIOLOGY I, Hristopher Missildine J, personally viewed and evaluated these images (plain radiographs) as part of my medical decision making, as well as  reviewing the written report by the radiologist.  ED MD interpretation: None  Official radiology report(s): No results found.  ____________________________________________   PROCEDURES  Procedure(s) performed (including Critical Care):  Procedures   ____________________________________________   INITIAL IMPRESSION / ASSESSMENT AND PLAN / ED COURSE  As part of my medical decision making, I reviewed the following data within the [place] notes reviewed and incorporated, Labs reviewed, Old chart reviewed and Notes from prior ED visits     45 year old female who presents with bladder pain and dysuria status post cystoscopy yesterday. Differential diagnosis includes, but is not limited to, ovarian cyst, ovarian torsion, acute appendicitis, diverticulitis, urinary tract infection/pyelonephritis, endometriosis, bowel obstruction, colitis, renal colic, gastroenteritis, hernia, fibroids, endometriosis, pregnancy related pain including ectopic pregnancy, etc.  UA demonstrates large leukocytes and >50 WBC.  Given patient's complaint of chills, will obtain blood cultures, lactic acid, basic lab work. Will obtain urine culture.  Will obtain postvoid residual.  Initiate IV fluid resuscitation, IV Rocephin, analgesia and antiemetic.  Will reassess   Clinical Course as of 01/19/21 0534  Fri Jan 19, 2021  0014 Postvoid residual = 58m [JS]  0154 Lactate 2.1.  Patient resting in no acute distress.  Will repeat lactic acid after 1 L IV fluids. [JS]  0510 Repeat lactic improved.  Patient without systemic signs of sepsis.  She is feeling fine and eager for discharge home.  Will discharge home on Keflex, Percocet/Zofran to use as needed and Pyridium.  She will follow-up with her urologist.  Strict return precautions given.  Patient verbalizes understanding agrees with plan of care [JS]    Clinical Course User Index [JS] SPaulette Blanch MD      ____________________________________________   FINAL CLINICAL IMPRESSION(S) / ED DIAGNOSES  Final diagnoses:  Dysuria  Acute cystitis with hematuria     ED Discharge Orders         Ordered  cephALEXin (KEFLEX) 500 MG capsule  3 times daily        01/19/21 0512    phenazopyridine (PYRIDIUM) 200 MG tablet  3 times daily PRN        01/19/21 0512    oxyCODONE-acetaminophen (PERCOCET/ROXICET) 5-325 MG tablet  Every 6 hours PRN        01/19/21 0512    ondansetron (ZOFRAN ODT) 4 MG disintegrating tablet  Every 8 hours PRN        01/19/21 4315          *Please note:  Alison [name] was evaluated in Emergency Department on 01/19/2021 for the symptoms described in the history of present illness. She was evaluated in the context of the global COVID-19 pandemic, which necessitated consideration that the patient might be at risk for infection with the SARS-CoV-2 virus that causes COVID-19. Institutional protocols and algorithms that pertain to the evaluation of patients at risk for COVID-19 are in a state of rapid change based on information released by regulatory bodies including the CDC and federal and state organizations. These policies and algorithms were followed during the patient's care in the ED.  Some ED evaluations and interventions may be delayed as a result of limited staffing during and the pandemic.*   Note:  This document was prepared using Dragon voice recognition software and may include unintentional dictation errors.   Paulette Blanch, MD 01/19/21 [phone]

## 2021-01-18 NOTE — Telephone Encounter (Signed)
Called patient to discuss symptoms-frequency worsened and discomfort-denies other symptoms. Instructed to go to urgent care or ER per Zara Council, PA

## 2021-01-18 NOTE — ED Notes (Signed)
Bladder scan shows: 19m

## 2021-01-18 NOTE — ED Notes (Signed)
Patient reports bladder scope yesterday. Patient reports frequent bladder infections, and reports the study was for that. Patient denies malignant findings. Patient reports pain to the bladder post procedure, and reports pain worsens with urination. Patient denies blood in her urine or foul odor. Patient reports hx of bladder scopes without residual bladder discomfort.

## 2021-01-18 NOTE — ED Triage Notes (Signed)
Pt states she had a uroscopy yesterday and today when she woke up she had pain in bladder, pt states she has noticed a small amount of blood in urine, pt states she was told to expect this from the scope, pt states she is experiencing urinary frequency but is only able to urinate a small amount. Pt states she feels like she is retaining urine.

## 2021-01-18 NOTE — ED Notes (Signed)
Patient post void residual: 57m

## 2021-01-19 ENCOUNTER — Other Ambulatory Visit: Payer: Self-pay

## 2021-01-19 DIAGNOSIS — F411 Generalized anxiety disorder: Secondary | ICD-10-CM | POA: Diagnosis not present

## 2021-01-19 DIAGNOSIS — F4310 Post-traumatic stress disorder, unspecified: Secondary | ICD-10-CM | POA: Diagnosis not present

## 2021-01-19 DIAGNOSIS — F3181 Bipolar II disorder: Secondary | ICD-10-CM | POA: Diagnosis not present

## 2021-01-19 LAB — BASIC METABOLIC PANEL
Anion gap: 11 (ref 5–15)
BUN: 19 mg/dL (ref 6–20)
CO2: 24 mmol/L (ref 22–32)
Calcium: 9.4 mg/dL (ref 8.9–10.3)
Chloride: 100 mmol/L (ref 98–111)
Creatinine, Ser: 1.01 mg/dL — ABNORMAL HIGH (ref 0.44–1.00)
GFR, Estimated: >60 mL/min (ref >60)
Glucose, Bld: 212 mg/dL — ABNORMAL HIGH (ref 70–99)
Potassium: 4.6 mmol/L (ref 3.5–5.1)
Sodium: 135 mmol/L (ref 135–145)

## 2021-01-19 LAB — CBC WITH DIFFERENTIAL/PLATELET
Abs Immature Granulocytes: 0.01 10*3/uL (ref 0.00–0.07)
Basophils Absolute: 0 10*3/uL (ref 0.0–0.1)
Basophils Relative: 1 %
Eosinophils Absolute: 0.4 10*3/uL (ref 0.0–0.5)
Eosinophils Relative: 6 %
HCT: 38.9 % (ref 36.0–46.0)
Hemoglobin: 12.9 g/dL (ref 12.0–15.0)
Immature Granulocytes: 0 %
Lymphocytes Relative: 26 %
Lymphs Abs: 1.8 10*3/uL (ref 0.7–4.0)
MCH: 29.3 pg (ref 26.0–34.0)
MCHC: 33.2 g/dL (ref 30.0–36.0)
MCV: 88.4 fL (ref 80.0–100.0)
Monocytes Absolute: 0.5 10*3/uL (ref 0.1–1.0)
Monocytes Relative: 8 %
Neutro Abs: 4.1 10*3/uL (ref 1.7–7.7)
Neutrophils Relative %: 59 %
Platelets: 127 10*3/uL — ABNORMAL LOW (ref 150–400)
RBC: 4.4 MIL/uL (ref 3.87–5.11)
RDW: 13.2 % (ref 11.5–15.5)
WBC: 6.8 10*3/uL (ref 4.0–10.5)
nRBC: 0 % (ref 0.0–0.2)

## 2021-01-19 LAB — LACTIC ACID, PLASMA
Lactic Acid, Venous: 2 mmol/L (ref 0.5–1.9)
Lactic Acid, Venous: 2.1 mmol/L (ref 0.5–1.9)

## 2021-01-19 MED ORDER — OXYCODONE-ACETAMINOPHEN 5-325 MG PO TABS: 1.0000 | ORAL_TABLET | Freq: Four times a day (QID) | ORAL | 0 refills | Status: DC | PRN

## 2021-01-19 MED ORDER — CEPHALEXIN 500 MG PO CAPS: 500.0000 mg | ORAL_CAPSULE | Freq: Three times a day (TID) | ORAL | 0 refills | Status: DC

## 2021-01-19 MED ORDER — PHENAZOPYRIDINE HCL 200 MG PO TABS: 200.0000 mg | ORAL_TABLET | Freq: Three times a day (TID) | ORAL | 0 refills | Status: DC | PRN

## 2021-01-19 MED ORDER — ONDANSETRON 4 MG PO TBDP: 4.0000 mg | ORAL_TABLET | Freq: Three times a day (TID) | ORAL | 0 refills | Status: DC | PRN

## 2021-01-19 NOTE — ED Notes (Signed)
Port access attempted by [employer], Therapist, sports.

## 2021-01-19 NOTE — ED Notes (Signed)
Port access attempt by Raquel unsuccessful, peripheral INT attempted with patient permission. See chart.

## 2021-01-19 NOTE — Discharge Instructions (Addendum)
1.  Take antibiotic as prescribed until finished (Keflex 500 mg 3 times daily x7 days). 2.  Urine culture is pending.  We will notify you if we need to change her antibiotic. 3.  Take Pyridium as prescribed for urinary discomfort. 4.  You may take pain and nausea medicine as needed (Percocet/Zofran). 5.  Drink plenty of fluids daily. 6.  Return to the ER for worsening symptoms, persistent vomiting, difficulty breathing or other concerns

## 2021-01-19 NOTE — Patient Instructions (Signed)
Visit Information  Ms. Imel was given information about Medicaid Managed Care team care coordination services as a part of their Healthy [facility] Medicaid benefit. Jimena Wieczorek verbally consented to engagement with the [facility] Managed Care team.   For questions related to your Healthy [facility] health plan, please call: [phone] or visit the homepage here: [email or web address]  If you would like to schedule transportation through your Healthy [facility] plan, please call the following number at least 2 days in advance of your appointment: [phone]   Call the [place] at [phone], at any time, 24 hours a day, 7 days a week. If you are in danger or need immediate medical attention call 911.  Ms. Lakatos - following are the goals we discussed in your visit today:  Goals Addressed   None     Please see education materials related to DM provided as print materials.   Patient verbalizes understanding of instructions provided today.   The Managed Medicaid care management team will reach out to the patient again over the next 30 days.   [place] Constable, Pharm.D., Managed Medicaid Pharmacist [phone]   Following is a copy of your plan of care:  Patient Care Plan: Chronic Pain (Adult)    Problem Identified: Chronic Pain Management-fibromyalgia     Long-Range Goal: Fibromyalgia pain managed-new pain management provider   Start Date: 08/14/2020  Expected End Date: 02/21/2021  Recent Progress: On track  Priority: High  Note:   Current Barriers:  . Care Coordination needs related to pain management provider.   Patient is experiencing fibromyalgia flair and is interested in help being connected to a pain management provider. . Unable to independently secure referral or appointment for pain management provider. [name] Update 11/24/20:  Patient is being followed by Dr. Humphrey Rolls.  Nurse Case Manager Clinical Goal(s):   [name], patient will work with [facility] to address needs related to referral for pain management provider and associated care coordination needs. [name] Update 09/22/20:  patient is currently seeing Dr. Humphrey Rolls at [facility].  Interventions:  . Inter-disciplinary care team collaboration (see longitudinal plan of care) . Evaluation of current treatment plan related to fibromyalgia  and patient's adherence to plan as established by provider. [name] with primary care provider regarding recommendations and referral to pain management provider. . Discussed plans with patient for ongoing care management follow up and provided patient with direct contact information for care management team . Anticipate pain education program, pain management support as part of pain management referral.      . [name] contacted patient to check in. Patient stated she has not contacted an eye doctor nor has she seen a specialist. Patient states she thinks she has COVID due to her husband having it. She has not taken a test yet, but has some symptoms. She is isolating in the bedroom and they have someone to cook and bring her food.       [name] Update 11/16/20: [name] completed phone call with patient, she states she is still having symptoms from [place] and has been in bed all month. Patient states she has not had a chance to contact an eye doctor or specialist due to being sick. [name] Update 11/24/20:  Patient with continued fatigue-will follow up with PCP. [name] Pharmacy referral for medication review. [name] Update 12/14/20: [name] completed call with patient, she stated she is waiting on a call from [facility] about getting a liver biopsy completed. She  has not reached out to the eye doctor yet because her liver is top priority right now. [name] Update 12/22/20:  Patient seen by PCP 12/11/20.  Has not seen Opthamologist yet.  Had liver biopsy 12/20/20 due to increased liver enzymes.  [name] Update 01/15/21: [name] completed a follow up with  patient. She stated she found out she has to have a liver transplant and is currently completing testing. Patient stated that she has been in pain since her pcp took her off of her pain medication. Patient stated her rent will be going up by $400 and they have to move. They will most likely move to [place] area because her fiance' works in that area. No resources needed at this time.  Patient Goals/Self-Care Activities Over the next 45 days, patient will:  -Attends all scheduled provider appointments  develop a personal pain management plan with your pain management provider.  Follow Up Plan:  The Managed Medicaid care management team will reach out to the patient again over the next 30 days.              Patient Care Plan: Wellness (Adult)    Problem Identified: Medication Adherence (Wellness)     Goal: Medication Adherence Maintained   Start Date: 09/06/2020  Expected End Date: 12/05/2020  Recent Progress: Not on track  Priority: High  Note:   Current Barriers:  . Patient states she needs to have prescriptions refilled and feels she has some side effects from her medications. [name] Update 12/22/20:  Patient's PCP discontinued Victoza and added Trulicity.  Patient has not been taking mediations as prescribed.  Hgb A1C 8.1 on 12/11/20.  Nurse Case Manager Clinical Goal(s):  [name] Over the next 30 days, patient will work with CM team pharmacist to review current medications.  Interventions:  . Inter-disciplinary care team collaboration (see longitudinal plan of care) . Evaluation of current treatment plan and patient's adherence to plan as established by provider. . Advised patient to contact her PCP for any medication needs. . Reviewed medications with patient. [name] with pharmacy regarding medications.  . Discussed plans with patient for ongoing care management follow up and provided patient with direct contact information for care management team . Pharmacy referral for  medication review . Update 12/22/20:  Patient has an appointment with Pharmacist 12/25/20.  Patient Goals/Self-Care Activities Over the next 30 days, patient will:  -Patient will take medications as prescribed. RNCM will follow up with patient within 30 days and make referral to pharmacy. Calls pharmacy for medication refills Calls provider office for new concerns or questions  Follow Up Plan: The Managed Medicaid care management team will reach out to the patient again over the next 30 days.  The patient has been provided with contact information for the Managed Medicaid care management team and has been advised to call with any health related questions or concerns.       Patient Care Plan: Medication Management    Problem Identified: Health Promotion or Disease Self-Management (General Plan of Care)     Goal: Self-Management Plan Developed   Note:   Current Barriers:  . Unable to achieve control of DM  .   Pharmacist Clinical Goal(s):  [name] Over the next 30 days, patient will achieve adherence to monitoring guidelines and medication adherence to achieve therapeutic efficacy . contact provider office for questions/concerns as evidenced notation of same in electronic health record through collaboration with PharmD and provider.  .   Interventions: . Inter-disciplinary care team collaboration (  see longitudinal plan of care) . Comprehensive medication review performed; medication list updated in electronic medical record  @RXCPDIABETES @ @RXCPHYPERTENSION @ @RXCPHYPERLIPIDEMIA @  Patient Goals/Self-Care Activities . Over the next 30 days, patient will:  - take medications as prescribed collaborate with provider on medication access solutions  Follow Up Plan: The care management team will reach out to the patient again over the next 30 days.     Task: Mutually Develop and Royce Macadamia Achievement of Patient Goals   Note:   Care Management Activities:    - verbalization of feelings  encouraged    Notes:

## 2021-01-19 NOTE — Patient Outreach (Addendum)
Medicaid Managed Care    Pharmacy Note  01/19/2021 Name: Alison [name] MRN: [id number] DOB: 1975-11-11  Alison [name] is a 45 y.o. year old female who is a primary care patient of [facility]. The [facility] Managed Care Coordination team was consulted for assistance with disease management and care coordination needs.    Engaged with patient Engaged with patient by telephone for follow up visit in response to referral for case management and/or care coordination services.  Alison [name] was given information about Managed Medicaid Care Coordination team services today. Alison [name] agreed to services and verbal consent obtained.   Objective:  Lab Results  Component Value Date   CREATININE 1.01 (H) 01/19/2021   CREATININE 1.05 (H) 12/22/2020   CREATININE 1.04 12/11/2020    Lab Results  Component Value Date   HGBA1C 8.9 (A) 12/11/2020       Component Value Date/Time   CHOL 219 (H) 12/11/2020 0000   TRIG 333 (H) 12/11/2020 0000   HDL 38 (L) 12/11/2020 0000   CHOLHDL 5.8 (H) 12/11/2020 0000   LDLCALC 133 (H) 12/11/2020 0000    Other: (TSH, CBC, Vit D, etc.)  Clinical ASCVD: No  The 10-year ASCVD risk score Mikey Bussing DC Jr., et al., 2013) is: 2.8%   Values used to calculate the score:     Age: 44 years     Sex: Female     Is Non-Hispanic African American: No     Diabetic: Yes     Tobacco smoker: No     Systolic Blood Pressure: 520 mmHg     Is BP treated: No     HDL Cholesterol: 38 mg/dL     Total Cholesterol: 219 mg/dL    Other: (CHADS2VASc if Afib, PHQ9 if depression, MMRC or CAT for COPD, ACT, DEXA)  BP Readings from Last 3 Encounters:  01/19/21 (!) 112/57  01/17/21 (!) 139/94  01/09/21 115/84    Assessment/Interventions: Review of patient past medical history, allergies, medications, health status, including review of consultants reports, laboratory and other test data, was performed as part of comprehensive evaluation and provision of  chronic care management services.   Tremors Benztropine 0.7m QD PRN Plan: Patient states she rarely has tremors   BiPolar/PTSD Dr.Eappenfrom psychiatry [facility] 07/25/2020 06/05/2020 09/23/2019 09/16/2019 08/10/2019  Decreased Interest 3 3 3 1 1   Down, Depressed, Hopeless 3 3 3 1 3   PHQ - 2 Score 6 6 6 2 4   Altered sleeping 3 3 3  0 0  Tired, decreased energy 2 3 3 1  0  Change in appetite 2 2 3 1 1   Feeling bad or failure about yourself  2 1 0 0 0  Trouble concentrating 3 3 2 1  0  Moving slowly or fidgety/restless 0 0 2 0 0  Suicidal thoughts 0 0 0 0 -  PHQ-9 Score 18 18 19 5 5   Difficult doing work/chores Very difficult Very difficult - Not difficult at all -  Some recent data might be hidden    PHQ last results Paroxetine Hydroxyzine 257mPRN Severe Anxiety Lamotrigine 2527mD Quetiapine 300m29m Tried/Failed: Alprazolam, Duloxetine 90mg6m(60mg 60mnd 30mg A17mStopped due to potential liver damage) Plan: Defer to specialist  Liver Transplant 16 year91old Tacrolimus 1mg QD 77mn: At goal, maintain therapy   DM Endocrine manages Testing: 1/week (hates seeing bad numbers) Urine Albumin Metformin 1000mg B8022VVlicity Tried/Failed: Liraglutide QD (forgets to take 3-4x/week) Dec 2021 Plan: Will keep Liraglutide outside of  fridge in pill bag (Good for 30 days, informed patient of this), this should improve compliance. F/U March, after A1C in Feb. March 6599: Started on Trulicity (From Victoza) but hasn't picked up yet. Told her to pick up ASAP, will f/u 1 month April 2022: Patient has been using Trulicity but sugars are still running high. Couldn't read dates but gave me these readings 423,210,264, 207,323, 219, 300 ,[phone], Plan: Will ask PCP to add on therapy, patient is open to this. She'd also like to try a dietician. She's Vegetarian and eats a lot of pastas/carbs.  HTN Lisinopril 49m Plan: At goal, maintain therapy  Pain States  related to Chemo damage 09/11/20: Didn't give pain scale but states is content at this time Baclofen 240mTID PRN Pregabalin 20080mTAMPZA Plan: Patient at goal, maintain therapy  Gout Colchicine 0.6mg71mD 1/year Plan: Patient rarely has attacks, no preventative therapy needed at this time  Medications (Cost) -Patient states Medicaid only pays for 6 prescriptions/month -Verified with MichSharyn LullP at [facility], that she has never heard of this in state of [place] (It is true in other states). Per patient, she stated that she can do 6/month or a doctor can call and override it. Plan: Patient will STOP paying cash for medications and start using insurance, this should save her $50+/month  Medication (Operations) -Patient states she forgets to get refills often. Has a pill box but never uses. States she accidentally takes Duloxetine 60mg88m on accident instead of Duloxetine 60 QD and 30mg 14mould like to get medications in packaging but has to use [facility], due to her paying cash March 2022 Plan: Will work on out-of pocket expenses at this visit, will inquire about packaging next visit April 2022: Spoke with [facility], they said there is no such limitation on her medications. Tried to call patient back but she didn't answer 01/19/21 UPDATE: Patient called back and would like all meds delivered and send every 3 months. Requested refills of meds. Verbal consent obtained for [facility] enhanced pharmacy services (medication synchronization, adherence packaging, delivery coordination). A medication sync plan was created to allow patient to get all medications delivered once every 30 to 90 days per patient preference. Patient understands they have freedom to choose pharmacy and clinical pharmacist will coordinate care between all prescribers and [facility].      SDOH (Social Determinants of Health) assessments and interventions performed:    Care Plan  Allergies  Allergen Reactions   . Penicillin G Itching, Rash, Anaphylaxis and Palpitations  . Lamictal [Lamotrigine] Rash  . Carbamazepine Other (See Comments)    Medication interaction-prograf  . Hydrocodone-Acetaminophen Itching  . Naproxen Itching    Medications Reviewed Today    Reviewed by Smith,Francene Finders (Pharmacy Technician) on 01/19/21 at 0512  [phone] Status: Complete  Medication Order Taking? Sig Documenting Provider Last Dose Status Informant  baclofen (LIORESAL) 20 MG tablet [id number] 1 tablet (20 mg total) by mouth 3 (three) times daily as needed for muscle spasms.  Patient taking differently: Take 20 mg by mouth 3 (three) times daily.   Boyce,[name]4/15/2022 Unknown time Active   benztropine (COGENTIN) 1 MG tablet [id number] 1 tablet (1 mg total) by mouth daily as needed for tremors. EappenUrsula Alertnknown PRN Active Self  Dulaglutide (TRULICITY) 0.75 M6.255WL/[id number] 0.75 mg into the skin once a week.  Patient taking differently: Inject 0.75 mg into the skin every Wednesday.  Myles Gip, DO 01/19/2021 Unknown time Active   Melatonin 10 MG TABS [id number] Yes Take 10 mg by mouth at bedtime as needed (sleep). [provider] Unknown PRN Active Self  metFORMIN (GLUCOPHAGE) 1000 MG tablet [id number] Yes Take 1 tablet (1,000 mg total) by mouth 2 (two) times daily with a meal. Myles Gip, DO 01/19/2021 Unknown time Active Self  mirabegron ER (MYRBETRIQ) 50 MG TB24 tablet [id number] Yes Take 1 tablet (50 mg total) by mouth daily. Debroah Loop, PA-C 01/19/2021 Unknown time Active Self  Multiple Vitamin (MULTI-VITAMIN) tablet [id number] Yes Take 1 tablet by mouth daily. [provider] 01/19/2021 Unknown time Active Self  oxyCODONE ER 13.5 MG C12A [id number] Yes Take 13.5 mg by mouth 2 (two) times daily. [provider] 01/19/2021 Unknown time Active Self  PARoxetine (PAXIL) 10 MG tablet [id number] Yes Take 1 tablet (10 mg  total) by mouth daily. [name], MD 01/19/2021 Unknown time Active Self  promethazine (PHENERGAN) 25 MG tablet [id number] No Take 1 tablet (25 mg total) by mouth as needed.  Patient not taking: No sig reported   Myles Gip, DO Not Taking Unknown time Active Self  QUEtiapine (SEROQUEL XR) 300 MG 24 hr tablet [id number] Yes Take 1 tablet (300 mg total) by mouth at bedtime. [name], MD 01/19/2021 Unknown time Active Self  sulfamethoxazole-trimethoprim (BACTRIM DS) 800-160 MG per tablet 1 tablet [id number]   Hollice Espy, MD  Active   tacrolimus (PROGRAF) 1 MG capsule [id number] Yes Take 1 capsule by mouth once daily  Patient taking differently: Take 1 mg by mouth 2 (two) times daily.   [name], FNP 01/19/2021 Unknown time Active            Med Note [name], [name]   Fri Jan 19, 2021  5:01 AM)            Patient Active Problem List   Diagnosis Date Noted  . Hepatic cirrhosis ([place])   . Opioid dependence with opioid-induced disorder ([place]) 12/20/2020  . 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 Bipolar disorder, in full remission, most recent episode depressed ([place]) 09/27/2020  . Suprapubic pain 07/25/2020  . Bipolar 1 disorder, depressed, mild ([place]) 07/10/2020  . 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 Edema of lower extremity 09/16/2019  . Carpal tunnel syndrome, left 07/26/2019  . Cubital tunnel syndrome on left 07/26/2019  . Bipolar I disorder, most recent episode depressed ([place]) 07/12/2019  . PTSD (post-traumatic stress disorder) 07/12/2019  . Umbilical hernia without obstruction and without gangrene 06/17/2019  . Encounter for surveillance of abnormal nevi 06/17/2019  . Pineal gland cyst 06/17/2019  . Chronic hip pain, left 06/02/2019  . Lumbar spondylosis 06/02/2019  . Mouth dryness 06/02/2019  . Obesity (BMI 35.0-39.9 without comorbidity) 06/02/2019  . Goals of care, counseling/discussion 05/18/2019  .  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 Occipital neuralgia 05/13/2019  . Plantar fasciitis of left foot 04/27/2019  . Fibromyalgia 03/23/2019  . Systemic lupus erythematosus (SLE) in adult ([place]) 03/23/2019  . Essential hypertension 03/23/2019  . Hepatitis 03/23/2019  . Mass of left kidney 03/23/2019  . Diabetes mellitus ([place]) 03/23/2019  . Nausea without vomiting 03/23/2019  . Neuropathy 03/23/2019  . Cancer ([place]) 03/23/2019  . Hyperlipidemia 03/23/2019  . Osteoarthritis 03/23/2019  . Trigeminal neuralgia 03/23/2019  . Chronic renal disease, stage III ([place]) 03/23/2019  . Nephrolithiasis 03/23/2019  . Migraine  03/23/2019  . OSA on CPAP 03/23/2019  . Fibrocystic breast 03/23/2019  . Heart murmur 03/23/2019  . Bipolar disorder, in partial remission, most recent episode depressed ([place]) 07/02/2017  . Abnormal uterine bleeding 03/18/2017  . Major depressive disorder, recurrent ([place]) 03/18/2017  . Morbid obesity ([place]) 03/18/2017  . Secondary hyperparathyroidism ([place]) 02/07/2017  . Neuropathic pain 11/19/2016  . Post herpetic neuralgia 11/19/2016  . 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 Lumbar disc disease with radiculopathy 07/26/2013  . S/P liver transplant ([place]) 07/26/2013  . Type 2 diabetes mellitus, uncontrolled ([place]) 07/26/2013  . Facial nerve disorder 06/29/2012  . Low back pain 12/26/2010    Conditions to be addressed/monitored: HTN, HLD and DM  Care Plan : Medication Management  Updates made by Lane Hacker, [place] since 12/25/2020 12:00 AM    Problem: Health Promotion or Disease Self-Management (General Plan of Care)     Goal: Self-Management Plan Developed   Note:   Current Barriers:  . Unable to achieve control of DM  .   Pharmacist Clinical Goal(s):  [name] Over the next 30 days, patient will achieve adherence to monitoring  guidelines and medication adherence to achieve therapeutic efficacy . contact provider office for questions/concerns as evidenced notation of same in electronic health record through collaboration with PharmD and provider.  .   Interventions: . Inter-disciplinary care team collaboration (see longitudinal plan of care) . Comprehensive medication review performed; medication list updated in electronic medical record  @RXCPDIABETES @ @RXCPHYPERTENSION @ @RXCPHYPERLIPIDEMIA @  Patient Goals/Self-Care Activities . Over the next 30 days, patient will:  - take medications as prescribed collaborate with provider on medication access solutions  Follow Up Plan: The care management team will reach out to the patient again over the next 30 days.     Task: Mutually Develop and Royce Macadamia Achievement of Patient Goals   Note:   Care Management Activities:    - verbalization of feelings encouraged    Notes:      Medication Assistance: None required. Patient affirms current coverage meets needs.   Follow up: Agree   Plan: The care management team will reach out to the patient again over the next 30 days.   [place] Constable, Pharm.D., Managed Medicaid Pharmacist - [phone]

## 2021-01-20 LAB — URINE CULTURE: Culture: 80000 — AB

## 2021-01-22 ENCOUNTER — Other Ambulatory Visit: Payer: Self-pay | Admitting: Obstetrics and Gynecology

## 2021-01-22 DIAGNOSIS — E1165 Type 2 diabetes mellitus with hyperglycemia: Secondary | ICD-10-CM

## 2021-01-22 MED ORDER — COOL BLOOD GLUCOSE TEST STRIPS VI STRP: ORAL_STRIP | 12 refills | Status: DC

## 2021-01-22 NOTE — Patient Outreach (Signed)
Medicaid Managed Care   Nurse Care Manager Note  01/22/2021 Name:  Alison [name] MRN:  [id number] DOB:  [birth year]  Alison [name] is an 45 y.o. year old female who is a primary patient of [facility].  The [facility] Managed Care Coordination team was consulted for assistance with:    chronic healthcare management needs.  Ms. Wettstein was given information about Medicaid Managed Care Coordination team services today. Auburn Hester agreed to services and verbal consent obtained.  Engaged with patient by telephone for follow up visit in response to provider referral for case management and/or care coordination services.   Assessments/Interventions:  Review of past medical history, allergies, medications, health status, including review of consultants reports, laboratory and other test data, was performed as part of comprehensive evaluation and provision of chronic care management services.  SDOH (Social Determinants of Health) assessments and interventions performed:   Care Plan  Allergies  Allergen Reactions  . Penicillin G Itching, Rash, Anaphylaxis and Palpitations  . Lamictal [Lamotrigine] Rash  . Carbamazepine Other (See Comments)    Medication interaction-prograf  . Hydrocodone-Acetaminophen Itching  . Naproxen Itching    Medications Reviewed Today    Reviewed by [name], RN (Registered Nurse) on 01/22/21 at 1101  Med List Status: <None>  Medication Order Taking? Sig Documenting Provider Last Dose Status Informant  baclofen (LIORESAL) 20 MG tablet [id number] No Take 1 tablet (20 mg total) by mouth 3 (three) times daily as needed for muscle spasms.  Patient taking differently: Take 20 mg by mouth 3 (three) times daily.   [name], FNP 01/19/2021 Unknown time Active   benztropine (COGENTIN) 1 MG tablet [id number] No Take 1 tablet (1 mg total) by mouth daily as needed for tremors. [name], MD Unknown PRN Active Self  cephALEXin (KEFLEX) 500  MG capsule [id number]  Take 1 capsule (500 mg total) by mouth 3 (three) times daily. Paulette Blanch, MD  Active   Dulaglutide (TRULICITY) 0.23 XI/3.5WY Bonney Aid [id number] No Inject 0.75 mg into the skin once a week.  Patient taking differently: Inject 0.75 mg into the skin every Wednesday.   Myles Gip, DO 01/19/2021 Unknown time Active   Melatonin 10 MG TABS [id number] No Take 10 mg by mouth at bedtime as needed (sleep). [provider] Unknown PRN Active Self  metFORMIN (GLUCOPHAGE) 1000 MG tablet [id number] No Take 1 tablet (1,000 mg total) by mouth 2 (two) times daily with a meal. Myles Gip, DO 01/19/2021 Unknown time Active Self  mirabegron ER (MYRBETRIQ) 50 MG TB24 tablet [id number] No Take 1 tablet (50 mg total) by mouth daily. Debroah Loop, PA-C 01/19/2021 Unknown time Active Self  Multiple Vitamin (MULTI-VITAMIN) tablet [id number] No Take 1 tablet by mouth daily. [provider] 01/19/2021 Unknown time Active Self  ondansetron (ZOFRAN ODT) 4 MG disintegrating tablet [id number]  Take 1 tablet (4 mg total) by mouth every 8 (eight) hours as needed for nausea or vomiting. Paulette Blanch, MD  Active   oxyCODONE ER 13.5 MG C12A [id number] No Take 13.5 mg by mouth 2 (two) times daily. [provider] 01/19/2021 Unknown time Active Self  oxyCODONE-acetaminophen (PERCOCET/ROXICET) 5-325 MG tablet [id number]  Take 1 tablet by mouth every 6 (six) hours as needed for severe pain. Paulette Blanch, MD  Active   PARoxetine (PAXIL) 10 MG tablet [id number] No Take 1 tablet (10 mg total) by mouth daily. [name], MD 01/19/2021 Unknown time Active Self  phenazopyridine (PYRIDIUM) 200 MG tablet  [id number]  Take 1 tablet (200 mg total) by mouth 3 (three) times daily as needed for pain. Paulette Blanch, MD  Active   promethazine (PHENERGAN) 25 MG tablet [id number] No Take 1 tablet (25 mg total) by mouth as needed.  Patient not taking: No sig reported   Myles Gip, DO Not Taking  Unknown time Active Self  QUEtiapine (SEROQUEL XR) 300 MG 24 hr tablet [id number] No Take 1 tablet (300 mg total) by mouth at bedtime. [name], MD 01/19/2021 Unknown time Active Self  sulfamethoxazole-trimethoprim (BACTRIM DS) 800-160 MG per tablet 1 tablet [id number]   Hollice Espy, MD  Active   tacrolimus (PROGRAF) 1 MG capsule [id number] No Take 1 capsule by mouth once daily  Patient taking differently: Take 1 mg by mouth 2 (two) times daily.   [name], FNP 01/19/2021 Unknown time Active            Med Note [name], [name]   Fri Jan 19, 2021  5:01 AM)            Patient Active Problem List   Diagnosis Date Noted  . Hepatic cirrhosis ([place])   . Opioid dependence with opioid-induced disorder ([place]) 12/20/2020  . 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 Bipolar disorder, in full remission, most recent episode depressed ([place]) 09/27/2020  . Suprapubic pain 07/25/2020  . Bipolar 1 disorder, depressed, mild ([place]) 07/10/2020  . 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 Edema of lower extremity 09/16/2019  . Carpal tunnel syndrome, left 07/26/2019  . Cubital tunnel syndrome on left 07/26/2019  . Bipolar I disorder, most recent episode depressed ([place]) 07/12/2019  . PTSD (post-traumatic stress disorder) 07/12/2019  . Umbilical hernia without obstruction and without gangrene 06/17/2019  . Encounter for surveillance of abnormal nevi 06/17/2019  . Pineal gland cyst 06/17/2019  . Chronic hip pain, left 06/02/2019  . Lumbar spondylosis 06/02/2019  . Mouth dryness 06/02/2019  . Obesity (BMI 35.0-39.9 without comorbidity) 06/02/2019  . Goals of care, counseling/discussion 05/18/2019  . 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 Occipital neuralgia 05/13/2019  . Plantar fasciitis of left foot 04/27/2019  . Fibromyalgia 03/23/2019   . Systemic lupus erythematosus (SLE) in adult ([place]) 03/23/2019  . Essential hypertension 03/23/2019  . Hepatitis 03/23/2019  . Mass of left kidney 03/23/2019  . Diabetes mellitus ([place]) 03/23/2019  . Nausea without vomiting 03/23/2019  . Neuropathy 03/23/2019  . Cancer ([place]) 03/23/2019  . Hyperlipidemia 03/23/2019  . Osteoarthritis 03/23/2019  . Trigeminal neuralgia 03/23/2019  . Chronic renal disease, stage III ([place]) 03/23/2019  . Nephrolithiasis 03/23/2019  . Migraine 03/23/2019  . OSA on CPAP 03/23/2019  . Fibrocystic breast 03/23/2019  . Heart murmur 03/23/2019  . Bipolar disorder, in partial remission, most recent episode depressed ([place]) 07/02/2017  . Abnormal uterine bleeding 03/18/2017  . Major depressive disorder, recurrent ([place]) 03/18/2017  . Morbid obesity ([place]) 03/18/2017  . Secondary hyperparathyroidism ([place]) 02/07/2017  . Neuropathic pain 11/19/2016  . Post herpetic neuralgia 11/19/2016  . 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 Lumbar disc disease with radiculopathy 07/26/2013  . S/P liver transplant ([place]) 07/26/2013  . Type 2 diabetes mellitus, uncontrolled ([place]) 07/26/2013  . Facial nerve disorder 06/29/2012  . Low back pain 12/26/2010    Conditions to be addressed/monitored per PCP order:  chronic healthcare management needs, fibromyalgia, chronic pain, DM, HTN, liver transplant-post transplant lymphoma.  Care Plan : Chronic Pain (Adult)  Updates made by [name], RN since 01/22/2021 12:00 AM    Problem: Chronic Pain Management-fibromyalgia   Priority: High  Onset Date: 01/22/2021    Long-Range Goal: Fibromyalgia pain managed-new pain management provider   Start Date: 08/14/2020  Expected End Date: 05/24/2021  Recent Progress: On track  Priority: High  Note:   Current Barriers:  . Care Coordination needs related to pain management provider.   Patient is experiencing fibromyalgia flair and is interested in help being connected to a  pain management provider. . Unable to independently secure referral or appointment for pain management provider. [name] Update 11/24/20:  Patient is being followed by Dr. Humphrey Rolls.  Nurse Case Manager Clinical Goal(s):  [name], patient will work with [facility] to address needs related to referral for pain management provider and associated care coordination needs. [name] Update 09/22/20:  patient is currently seeing Dr. Humphrey Rolls at [facility].  Interventions:  . Inter-disciplinary care team collaboration (see longitudinal plan of care) . Evaluation of current treatment plan related to fibromyalgia  and patient's adherence to plan as established by provider. [name] with primary care provider regarding recommendations and referral to pain management provider. . Discussed plans with patient for ongoing care management follow up and provided patient with direct contact information for care management team . Anticipate pain education program, pain management support as part of pain management referral.      . [name] contacted patient to check in. Patient stated she has not contacted an eye doctor nor has she seen a specialist. Patient states she thinks she has COVID due to her husband having it. She has not taken a test yet, but has some symptoms. She is isolating in the bedroom and they have someone to cook and bring her food.       [name] Update 11/16/20: [name] completed phone call with patient, she states she is still having symptoms from [place] and has been in bed all month. Patient states she has not had a chance to contact an eye doctor or specialist due to being sick. [name] Update 11/24/20:  Patient with continued fatigue-will follow up with PCP. [name] Update 01/22/21:  patient continues to follow up with provider. . Pharmacy referral for medication review. [name] Update 01/22/21:  Patient met with Pharmacist-continuing to follow.  Next appointment 02/19/21. [name] Update 12/14/20: [name] completed call with  patient, she stated she is waiting on a call from [facility] about getting a liver biopsy completed. She has not reached out to the eye doctor yet because her liver is top priority right now. [name] Update 12/22/20:  Patient seen by PCP 12/11/20.  Has not seen Opthamologist yet.  Had liver biopsy 12/20/20 due to increased liver enzymes.  [name] Update 01/15/21: [name] completed a follow up with patient. She stated she found out she has to have a liver transplant and is currently completing testing. Patient stated that she has been in pain since her pcp took her off of her pain medication. Patient stated her rent will be going up by $400 and they have to move. They will most likely move to /[place] area because her fiance' works in that area. No resources needed at this time.  Patient Goals/Self-Care Activities Over the next 45 days, patient will:  -Attends all scheduled provider appointments  develop a personal pain management plan with your pain management provider.  Follow Up Plan:  The Managed Medicaid care  management team will reach out to the patient again over the next 30 days.     Care Plan : Wellness (Adult)  Updates made by [name], RN since 01/22/2021 12:00 AM    Problem: Medication Adherence (Wellness)   Priority: High  Onset Date: 01/22/2021    Goal: Medication Adherence Maintained   Start Date: 09/06/2020  Expected End Date: 03/07/2021  Recent Progress: Not on track  Priority: High  Note:   Current Barriers:  . Patient states she needs to have prescriptions refilled and feels she has some side effects from her medications. [name] Update 12/22/20:  Patient's PCP discontinued Victoza and added Trulicity.  Patient has not been taking mediations as prescribed.  Hgb A1C 8.1 on 12/11/20. [name] Update 01/22/21:  Patient states she is taking medications.  She checks blood sugar once a day and needs more testing strips.  Blood sugars 200's per patient.  Nurse Case Manager Clinical Goal(s):  [name] Over the next 30  days, patient will work with CM team pharmacist to review current medications. [name] Update 01/22/21:  Patient met with Pharmacist and continues to follow-next appointment 02/19/21.  Interventions:  . Inter-disciplinary care team collaboration (see longitudinal plan of care) . Evaluation of current treatment plan and patient's adherence to plan as established by provider. . Advised patient to contact her PCP for any medication needs. . Reviewed medications with patient. [name] with pharmacy regarding medications.  . Discussed plans with patient for ongoing care management follow up and provided patient with direct contact information for care management team . Pharmacy referral for medication review . Update 12/22/20:  Patient has an appointment with Pharmacist 12/25/20. . Will notify PCP of need for testing strips and dietician referral.  Patient Goals/Self-Care Activities Over the next 30 days, patient will:  -Patient will take medications as prescribed. RNCM will notify PCP of need for testing strips and dietician referral. Calls pharmacy for medication refills Calls provider office for new concerns or questions  Follow Up Plan: The Managed Medicaid care management team will reach out to the patient again over the next 30 days.  The patient has been provided with contact information for the Managed Medicaid care management team and has been advised to call with any health related questions or concerns.     Follow Up:  Patient agrees to Care Plan and Follow-up.  Plan: The Managed Medicaid care management team will reach out to the patient again over the next 30 days. and The patient has been provided with contact information for the Managed Medicaid care management team and has been advised to call with any health related questions or concerns.  Date/time of next scheduled RN care management/care coordination outreach:  02/21/21 at 1030.

## 2021-01-22 NOTE — Patient Instructions (Signed)
Hi Ms. Manninen, thank you for speaking with me today.  Ms. Doto was given information about Medicaid Managed Care team care coordination services as a part of their Healthy [facility] Medicaid benefit. Jyoti Harju verbally consented to engagement with the [facility] Managed Care team.   For questions related to your Healthy [facility] health plan, please call: [phone] or visit the homepage here: [email or web address]  If you would like to schedule transportation through your Healthy [facility] plan, please call the following number at least 2 days in advance of your appointment: [phone]   Call the [facility] at [phone], at any time, 24 hours a day, 7 days a week. If you are in danger or need immediate medical attention call 911.  Ms. Pat - following are the goals we discussed in your visit today:  Goals Addressed            This Visit's Progress   . Chronic Pain Managed        Update 01/22/21:  Patient sees Dr. Humphrey Rolls for chronic pain management.  Evidence-based guidance:   Address common beliefs about pain, such as pain is to be endured, a normal part of aging or that it is not "real"; feelings of resignation that nothing can be done and that complaining will be a sign of weakness.   Assess pain level, treatment efficacy and patient response at regular intervals using a consistent pain scale.   Assess pain using self-report (most reliable), family/caregiver report, validated pain scale; consider impact on quality of life.   Determine if pain is associated with mobility or at rest, location, intensity, frequency, duration, recurrence, pattern and description (e.g., cramping, burning, aching), triggers and relieving factors.    Anticipate referral to pain education program, pain management support or community resources for specific diagnoses (e.g., cancer, fibromyalgia, multiple sclerosis).   Explore fears  associated with anticipated or imagined pain; encourage acceptance-based approaches.   Encourage exposure to experiences previously avoided due to fear of pain.   Anticipate referral to pain management specialist, physical therapist, addiction specialist (if history of substance use), psychotherapist; advocate for consultation with pharmacist.   Initiate nonpharmacologic measures, such as cognitive behavior therapy, mindfulness, guided imagery, massage, distraction, relaxation, chiropractic manipulation, dietary supplements or acupuncture.   Provide multimodal treatment interventions, such as physical activity, therapeutic exercise, yoga, TENS (transcutaneous electrical nerve stimulation) and manual therapy.   Train in functional activity modifications, such as body mechanics, posture, ergonomics, energy conservation and activity pacing.   Encourage use of local anesthetic or analgesic therapy as an adjunct for pain control (e.g., lidocaine patch, capsaicin cream, topical nonsteroidal anti-inflammatory drugs).   Prepare patient for use of pharmacologic therapy in a stepped approach that may include acetaminophen, nonsteroidal anti-inflammatory drugs, opioid, antiepileptic, antidepressant or nonbenzodiazepine muscle relaxant.  Review efficacy, tolerability, adherence and manage medication-induced side effects.         Patient verbalizes understanding of instructions provided today.   The Managed Medicaid care management team will reach out to the patient again over the next 30 days.  The patient has been provided with contact information for the Managed Medicaid care management team and has been advised to call with any health related questions or concerns.   [name], [name] [place]  [facility] - Managed Medicaid High Risk [phone].  Following is a copy of your plan of care:  Patient Care Plan: Chronic Pain (Adult)    Problem  Identified: Chronic Pain Management-fibromyalgia  Priority: High  Onset Date: 01/22/2021    Long-Range Goal: Fibromyalgia pain managed-new pain management provider   Start Date: 08/14/2020  Expected End Date: 05/24/2021  Recent Progress: On track  Priority: High  Note:   Current Barriers:  . Care Coordination needs related to pain management provider.   Patient is experiencing fibromyalgia flair and is interested in help being connected to a pain management provider. . Unable to independently secure referral or appointment for pain management provider. [name] Update 11/24/20:  Patient is being followed by Dr. Humphrey Rolls.  Nurse Case Manager Clinical Goal(s):  [name], patient will work with [facility] to address needs related to referral for pain management provider and associated care coordination needs. [name] Update 09/22/20:  patient is currently seeing Dr. Humphrey Rolls at [facility].  Interventions:  . Inter-disciplinary care team collaboration (see longitudinal plan of care) . Evaluation of current treatment plan related to fibromyalgia  and patient's adherence to plan as established by provider. [name] with primary care provider regarding recommendations and referral to pain management provider. . Discussed plans with patient for ongoing care management follow up and provided patient with direct contact information for care management team . Anticipate pain education program, pain management support as part of pain management referral.      . [name] contacted patient to check in. Patient stated she has not contacted an eye doctor nor has she seen a specialist. Patient states she thinks she has COVID due to her husband having it. She has not taken a test yet, but has some symptoms. She is isolating in the bedroom and they have someone to cook and bring her food.       [name] Update 11/16/20: [name] completed phone call with patient, she states she is still having symptoms from [place]  and has been in bed all month. Patient states she has not had a chance to contact an eye doctor or specialist due to being sick. [name] Update 11/24/20:  Patient with continued fatigue-will follow up with PCP. [name] Update 01/22/21:  patient continues to follow up with provider. . Pharmacy referral for medication review. [name] Update 01/22/21:  Patient met with Pharmacist-continuing to follow.  Next appointment 02/19/21. [name] Update 12/14/20: [name] completed call with patient, she stated she is waiting on a call from [facility] about getting a liver biopsy completed. She has not reached out to the eye doctor yet because her liver is top priority right now. [name] Update 12/22/20:  Patient seen by PCP 12/11/20.  Has not seen Opthamologist yet.  Had liver biopsy 12/20/20 due to increased liver enzymes.  [name] Update 01/15/21: [name] completed a follow up with patient. She stated she found out she has to have a liver transplant and is currently completing testing. Patient stated that she has been in pain since her pcp took her off of her pain medication. Patient stated her rent will be going up by $400 and they have to move. They will most likely move to /[place] area because her fiance' works in that area. No resources needed at this time.  Patient Goals/Self-Care Activities Over the next 45 days, patient will:  -Attends all scheduled provider appointments  develop a personal pain management plan with your pain management provider.  Follow Up Plan:  The Managed Medicaid care management team will reach out to the patient again over the next 30 days.    Patient Care Plan: Wellness (Adult)    Problem Identified: Medication  Adherence (Wellness)   Priority: High  Onset Date: 01/22/2021    Goal: Medication Adherence Maintained   Start Date: 09/06/2020  Expected End Date: 03/07/2021  Recent Progress: Not on track  Priority: High  Note:   Current Barriers:  . Patient states she needs to have prescriptions refilled and feels she has some  side effects from her medications. [name] Update 12/22/20:  Patient's PCP discontinued Victoza and added Trulicity.  Patient has not been taking mediations as prescribed.  Hgb A1C 8.1 on 12/11/20. [name] Update 01/22/21:  Patient states she is taking medications.  She checks blood sugar once a day and needs more testing strips.  Blood sugars 200's per patient.  Nurse Case Manager Clinical Goal(s):  [name] Over the next 30 days, patient will work with CM team pharmacist to review current medications. [name] Update 01/22/21:  Patient met with Pharmacist and continues to follow-next appointment 02/19/21.  Interventions:  . Inter-disciplinary care team collaboration (see longitudinal plan of care) . Evaluation of current treatment plan and patient's adherence to plan as established by provider. . Advised patient to contact her PCP for any medication needs. . Reviewed medications with patient. [name] with pharmacy regarding medications.  . Discussed plans with patient for ongoing care management follow up and provided patient with direct contact information for care management team . Pharmacy referral for medication review . Update 12/22/20:  Patient has an appointment with Pharmacist 12/25/20. . Will notify PCP of need for testing strips and dietician referral.  Patient Goals/Self-Care Activities Over the next 30 days, patient will:  -Patient will take medications as prescribed. RNCM will notify PCP of need for testing strips and dietician referral. Calls pharmacy for medication refills Calls provider office for new concerns or questions w Up Plan: The Managed Medicaid care management team will reach out to the patient again over the next 30 days.  The patient has been provided with contact information for the Managed Medicaid care management team and has been advised to call with any health related questions or concerns.

## 2021-01-22 NOTE — Addendum Note (Signed)
Addended by: Docia Furl on: 01/22/2021 02:01 PM   Modules accepted: Orders

## 2021-01-22 NOTE — Patient Outreach (Signed)
Liver Transplant 45 years old Tacrolimus 78m QD Plan:Called Dr. NRosalyn Gessfor refills of meds to [facility] on Friday. JJulian Hy nurse admin, called me from [phone] Told me I had to call Nurse is LDub Mikes [phone]: [phone] However, when I called the VM was full. Called JJulian Hyagain to determine what to do.

## 2021-01-23 ENCOUNTER — Encounter: Payer: Self-pay | Admitting: Family Medicine

## 2021-01-23 ENCOUNTER — Other Ambulatory Visit: Payer: Self-pay

## 2021-01-23 DIAGNOSIS — K759 Inflammatory liver disease, unspecified: Secondary | ICD-10-CM

## 2021-01-23 DIAGNOSIS — Z944 Liver transplant status: Secondary | ICD-10-CM

## 2021-01-23 NOTE — Telephone Encounter (Signed)
Last seen on 12-11-2020 and does not have another appt

## 2021-01-23 NOTE — Telephone Encounter (Signed)
Requested medication (s) are due for refill today: yes  Requested medication (s) are on the active medication list: yes  Last refill:  07/08/19  Future visit scheduled: no  Notes to clinic:  Medication not delegated to NT to RF   Requested Prescriptions  Pending Prescriptions Disp Refills   tacrolimus (PROGRAF) 1 MG capsule 30 capsule 0    Sig: Take 1 capsule (1 mg total) by mouth daily.      Not Delegated - Immunology: Immunosuppressive Agents - tacrolimus Failed - 01/23/2021  3:06 PM      Failed - This refill cannot be delegated      Failed - ALT in normal range and within 90 days    ALT  Date Value Ref Range Status  12/22/2020 51 (H) 0 - 44 U/L Final          Failed - AST in normal range and within 90 days    AST  Date Value Ref Range Status  12/22/2020 68 (H) 15 - 41 U/L Final          Failed - Cr in normal range and within 90 days    Creat  Date Value Ref Range Status  12/11/2020 1.04 0.50 - 1.10 mg/dL Final   Creatinine, Ser  Date Value Ref Range Status  01/19/2021 1.01 (H) 0.44 - 1.00 mg/dL Final   Creatinine, Urine  Date Value Ref Range Status  06/05/2020 113 20 - 275 mg/dL Final          Failed - HDL in normal range and within 360 days    HDL  Date Value Ref Range Status  12/11/2020 38 (L) > OR = 50 mg/dL Final          Failed - LDL in normal range and within 360 days    LDL Cholesterol (Calc)  Date Value Ref Range Status  12/11/2020 133 (H) mg/dL (calc) Final    Comment:    Reference range: <100 . Desirable range <100 mg/dL for primary prevention;   <70 mg/dL for patients with CHD or diabetic patients  with > or = 2 CHD risk factors. [name] LDL-C is now calculated using the Martin-Hopkins  calculation, which is a validated novel method providing  better accuracy than the Friedewald equation in the  estimation of LDL-C.  Cresenciano Genre et al. Annamaria Helling. 1025;852(77): 2061-2068  (http://education.[employer].com/faq/FAQ164)           Failed -  PLT in normal range and within 90 days    Platelets  Date Value Ref Range Status  01/19/2021 127 (L) 150 - 400 K/uL Final          Failed - Tacrolimus in normal range and within 90 days    No results found for: TACROLIMUS        Failed - Total Cholesterol in normal range and within 360 days    Cholesterol  Date Value Ref Range Status  12/11/2020 219 (H) <200 mg/dL Final          Failed - Triglycerides in normal range and within 360 days    Triglycerides  Date Value Ref Range Status  12/11/2020 333 (H) <150 mg/dL Final    Comment:    . If a non-fasting specimen was collected, consider repeat triglyceride testing on a fasting specimen if clinically indicated.  Yates Decamp et al. J. of Clin. Lipidol. 8242;3:536-144. .           Failed - Protein (Urine) in normal range and within 90 days  Protein, ur  Date Value Ref Range Status  01/18/2021 >=300 (A) NEGATIVE mg/dL Final   Total Protein  Date Value Ref Range Status  12/22/2020 7.4 6.5 - 8.1 g/dL Final          Passed - HCT in normal range and within 90 days    HCT  Date Value Ref Range Status  01/19/2021 38.9 36.0 - 46.0 % Final          Passed - HGB in normal range and within 90 days    Hemoglobin  Date Value Ref Range Status  01/19/2021 12.9 12.0 - 15.0 g/dL Final          Passed - K in normal range and within 90 days    Potassium  Date Value Ref Range Status  01/19/2021 4.6 3.5 - 5.1 mmol/L Final          Passed - WBC in normal range and within 90 days    WBC  Date Value Ref Range Status  01/19/2021 6.8 4.0 - 10.5 K/uL Final          Passed - Valid encounter within last 3 months    Recent Outpatient Visits           1 month ago Uncontrolled type 2 diabetes mellitus with hyperglycemia [facility])   [place], [place], DO   1 month ago Uncontrolled type 2 diabetes mellitus with hyperglycemia [facility])   [place], DO   3 months ago  Viral syndrome   [place], MD   3 months ago Bipolar I disorder, most recent episode depressed [facility])   [facility] [place] Malk[place], MD   4 months ago Urgency of urination   [facility] Tow[place] Malkin, MD       Future Appointments             In 2 months [place], Corlis Leak [address] [facility]

## 2021-01-23 NOTE — Patient Outreach (Signed)
Patient's Tacrolimus not sent in, f/u with provider and they left in May. Called [phone] and left msg with Marjory Lies to get another provider to send to [facility]

## 2021-01-23 NOTE — Telephone Encounter (Unsigned)
Copied from [place] [id number]. Topic: Quick Communication - Rx Refill/Question >> Jan 23, 2021  2:53 PM Tessa Lerner A wrote: Medication: tacrolimus (PROGRAF) 1 MG capsule   Has the patient contacted their pharmacy? Patient's insurance company has made contact on their behalf. Patient as well as their insurance rep have had a hard time previously with contacting their pharmacy  Preferred Pharmacy (with phone number or street name): [facility] - [place], [place] - [address]  Phone:  [phone] Fax:  [phone]  Agent: Please be advised that RX refills may take up to 3 business days. We ask that you follow-up with your pharmacy.

## 2021-01-24 ENCOUNTER — Telehealth: Payer: Self-pay | Admitting: Psychiatry

## 2021-01-24 DIAGNOSIS — F4310 Post-traumatic stress disorder, unspecified: Secondary | ICD-10-CM

## 2021-01-24 LAB — CULTURE, BLOOD (ROUTINE X 2)
Culture: NO GROWTH
Special Requests: ADEQUATE

## 2021-01-24 MED ORDER — QUETIAPINE FUMARATE ER 300 MG PO TB24: 300.0000 mg | ORAL_TABLET | Freq: Every day | ORAL | 0 refills | Status: DC

## 2021-01-24 MED ORDER — PAROXETINE HCL 10 MG PO TABS: 10.0000 mg | ORAL_TABLET | Freq: Every day | ORAL | 0 refills | Status: DC

## 2021-01-24 NOTE — Progress Notes (Signed)
I have sent it for 90 days .  Thank you

## 2021-01-24 NOTE — Telephone Encounter (Signed)
Received a request to send patient's medications to a new pharmacy which will be cheaper for her.  Called patient, discussed this with her.  We will send Seroquel and Paxil for [facility] [place].

## 2021-01-29 ENCOUNTER — Other Ambulatory Visit: Payer: Self-pay

## 2021-01-29 ENCOUNTER — Ambulatory Visit
Admission: RE | Admit: 2021-01-29 | Discharge: 2021-01-29 | Disposition: A | Discharge status: Home / Self Care | Payer: Medicaid Other | Source: Ambulatory Visit | Attending: Transplant Hepatology | Admitting: Transplant Hepatology

## 2021-01-29 ENCOUNTER — Ambulatory Visit: Payer: Medicaid Other

## 2021-01-29 ENCOUNTER — Encounter: Payer: Self-pay | Admitting: Family Medicine

## 2021-01-29 DIAGNOSIS — K7460 Unspecified cirrhosis of liver: Secondary | ICD-10-CM | POA: Insufficient documentation

## 2021-01-29 DIAGNOSIS — Z944 Liver transplant status: Secondary | ICD-10-CM | POA: Diagnosis not present

## 2021-01-29 DIAGNOSIS — K7581 Nonalcoholic steatohepatitis (NASH): Secondary | ICD-10-CM | POA: Insufficient documentation

## 2021-01-30 ENCOUNTER — Telehealth: Payer: Self-pay

## 2021-01-30 NOTE — Telephone Encounter (Signed)
Blessings from the [facility] Liver transplant office, called to retrieve the recent procedure report on patient. They referred the patient to [place] for an EDG. Office number [phone] fax#

## 2021-01-31 NOTE — Telephone Encounter (Signed)
Left vm for Alison [name] at [facility] Liver Transplant clinic that I have faxed the EGD report per her request to [phone].

## 2021-02-07 ENCOUNTER — Telehealth (INDEPENDENT_AMBULATORY_CARE_PROVIDER_SITE_OTHER): Payer: Medicaid Other | Admitting: Psychiatry

## 2021-02-07 ENCOUNTER — Encounter: Payer: Self-pay | Admitting: Psychiatry

## 2021-02-07 ENCOUNTER — Other Ambulatory Visit: Payer: Self-pay

## 2021-02-07 DIAGNOSIS — F4310 Post-traumatic stress disorder, unspecified: Secondary | ICD-10-CM

## 2021-02-07 DIAGNOSIS — F3131 Bipolar disorder, current episode depressed, mild: Secondary | ICD-10-CM | POA: Diagnosis not present

## 2021-02-07 DIAGNOSIS — G2111 Neuroleptic induced parkinsonism: Secondary | ICD-10-CM | POA: Diagnosis not present

## 2021-02-07 MED ORDER — DULOXETINE HCL 30 MG PO CPEP
30.0000 mg | ORAL_CAPSULE | Freq: Two times a day (BID) | ORAL | 0 refills | Status: DC
Start: 2021-02-07 — End: 2021-05-09

## 2021-02-07 NOTE — Progress Notes (Signed)
Virtual Visit via Video Note  I connected with Alison [name] on 02/07/21 at  1:20 PM EDT by a video enabled telemedicine application and verified that I am speaking with the correct person using two identifiers.  Location Provider Location : [facility] Patient Location : Home  Participants: Patient , Provider   I discussed the limitations of evaluation and management by telemedicine and the availability of in person appointments. The patient expressed understanding and agreed to proceed.    I discussed the assessment and treatment plan with the patient. The patient was provided an opportunity to ask questions and all were answered. The patient agreed with the plan and demonstrated an understanding of the instructions.   The patient was advised to call back or seek an in-person evaluation if the symptoms worsen or if the condition fails to improve as anticipated.   [place] MD OP Progress Note  02/07/2021 5:31 PM Charisse Wendell  MRN:  [id number]  Chief Complaint:  Chief Complaint    Follow-up; Anxiety     HPI: Alison [name] is a 45 year old Caucasian female, lives in [place], has a history of bipolar disorder, PTSD, multiple medical problems including fibromyalgia, chronic pain, trigeminal neuralgia, history of liver transplant, B-cell lymphoma, SLE, diabetes melitis, OSA, migraine headaches was evaluated by telemedicine today.  Patient today reports she is currently struggling with anxiety symptoms.  She reports she also has low mood and has fatigue, tiredness, concentration problems, irritability, low appetite.  It is likely the fatigue is also due to her multiple medical problems including her pain.  She reports she is not interested in continuing the Paxil.  She was able to talk to her providers who is managing her liver problems and was told she could go back on the Cymbalta however will have to stay at the 60 mg daily.  She is interested in doing so.  Patient reports sleep problems on  and off.  However the Seroquel does help.  Patient denies any suicidality or homicidality.  Patient denies any other concerns today.  Visit Diagnosis:    ICD-10-CM   1. Bipolar 1 disorder, depressed, mild ([place])  F31.31   2. PTSD (post-traumatic stress disorder)  F43.10 DULoxetine (CYMBALTA) 30 MG capsule  3. 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 Abnormal uterine bleeding   . Allergy   . Anxiety   . Arthritis   . Bipolar disorder (manic depression) ([place])   . Chronic kidney failure   . Chronic renal disease, stage III ([place])   . Diabetes mellitus without complication ([place])   . 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  . FH: trigeminal neuralgia   . GERD (gastroesophageal reflux disease)   . Heart murmur   . Hypertension   . Kidney mass   . Lupus ([place])   . Lupus ([place])   . Major depressive disorder   . 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 Migraine   . Morbid obesity ([place])   . Neuromuscular disorder ([facility])    neuropathy  . Neuropathy   . Personality disorder ([place])   . Post herpetic neuralgia   . PTSD (post-traumatic stress disorder)   . Renal disorder   . S/P liver transplant [facility])     Past Surgical History:  Procedure Laterality Date  . BONE MARROW BIOPSY  01/13/2015  . BREAST BIOPSY  12/2014  . BREAST BIOPSY  2011  . BREAST SURGERY    . CHOLECYSTECTOMY    . COLONOSCOPY    . ESOPHAGOGASTRODUODENOSCOPY    . ESOPHAGOGASTRODUODENOSCOPY (EGD) WITH PROPOFOL N/A 01/09/2021   Procedure: ESOPHAGOGASTRODUODENOSCOPY (EGD) WITH PROPOFOL;  Surgeon: [name], MD;  Location: [facility] ENDOSCOPY;  Service: Endoscopy;  Laterality: N/A;  . HERNIA REPAIR    . infusaport    . LIVER TRANSPLANT  12/17/1991  . LUMBAR PUNCTURE     . PORT A CATH INJECTION ([place] [facility])    . tumor removal  2015    Family Psychiatric History: I have reviewed family psychiatric history from progress note on 06/10/2019  Family History:  Family History  Problem Relation Age of Onset  . Heart disease Mother   . Hypertension Mother   . Cancer - Other Mother   . Bipolar disorder Mother   . Heart disease Father   . Hypertension Father   . Diabetes Father   . Parkinson's disease Maternal Grandmother   . Cancer Maternal Aunt   . Cancer Maternal Uncle   . Cancer Maternal Grandfather   . Lupus Paternal Grandmother   . Hypertension Brother     Social History: I have reviewed social history from progress note on 06/10/2019 Social History   Socioeconomic History  . Marital status: Single    Spouse name: Not on file  . Number of children: 0  . Years of education: 9  . Highest education level: GED or equivalent  Occupational History  . Occupation: disabled  Tobacco Use  . Smoking status: Never Smoker  . Smokeless tobacco: Never Used  Vaping Use  . Vaping Use: Never used  Substance and Sexual Activity  . Alcohol use: Not Currently  . Drug use: Not Currently    Types: Marijuana    Comment: pain managment last used in early april  . Sexual activity: Not Currently  Other Topics Concern  . Not on file  Social History Narrative  . Not on file   Social Determinants of Health   Financial Resource Strain: Not on file  Food Insecurity: Not on file  Transportation Needs: Not on file  Physical Activity: Not on file  Stress: Not on file  Social Connections: Not on file    Allergies:  Allergies  Allergen Reactions  . Penicillin G Itching, Rash, Anaphylaxis and Palpitations  . Lamictal [Lamotrigine] Rash  . Carbamazepine Other (See Comments)    Medication interaction-prograf  . Hydrocodone-Acetaminophen Itching  . Naproxen Itching    Metabolic Disorder Labs: Lab Results  Component Value Date   HGBA1C 8.9 (A) 12/11/2020    MPG 209 06/05/2020   Lab Results  Component Value Date   PROLACTIN 13.4 07/15/2019   PROLACTIN 21.3 05/24/2019   Lab Results  Component Value Date   CHOL 219 (H) 12/11/2020   TRIG 333 (H) 12/11/2020   HDL 38 (L) 12/11/2020   CHOLHDL 5.8 (H) 12/11/2020   LDLCALC 133 (H) 12/11/2020   LDLCALC 111 (H) 06/05/2020   Lab Results  Component Value Date   TSH 2.650 07/15/2019   TSH 4.650 (H) 05/24/2019    Therapeutic Level Labs: No results found for: LITHIUM No results found for: VALPROATE No components found for:  CBMZ  Current Medications: Current Outpatient Medications  Medication Sig Dispense Refill  . DULoxetine (CYMBALTA) 30 MG capsule Take 1 capsule (30 mg total) by mouth 2 (two)  times daily. 180 capsule 0  . baclofen (LIORESAL) 20 MG tablet Take 1 tablet (20 mg total) by mouth 3 (three) times daily as needed for muscle spasms. (Patient taking differently: Take 20 mg by mouth 3 (three) times daily.) 90 each 2  . benztropine (COGENTIN) 1 MG tablet Take 1 tablet (1 mg total) by mouth daily as needed for tremors. 30 tablet 1  . cephALEXin (KEFLEX) 500 MG capsule Take 1 capsule (500 mg total) by mouth 3 (three) times daily. 21 capsule 0  . Dulaglutide (TRULICITY) 3.64 WO/0.3OZ SOPN Inject 0.75 mg into the skin once a week. (Patient taking differently: Inject 0.75 mg into the skin every Wednesday.) 3 mL 3  . glucose blood (COOL BLOOD GLUCOSE TEST STRIPS) test strip Use as instructed 100 each 12  . Melatonin 10 MG TABS Take 10 mg by mouth at bedtime as needed (sleep).    . metFORMIN (GLUCOPHAGE) 1000 MG tablet Take 1 tablet (1,000 mg total) by mouth 2 (two) times daily with a meal. 180 tablet 3  . mirabegron ER (MYRBETRIQ) 50 MG TB24 tablet Take 1 tablet (50 mg total) by mouth daily. 90 tablet 3  . Multiple Vitamin (MULTI-VITAMIN) tablet Take 1 tablet by mouth daily.    . ondansetron (ZOFRAN ODT) 4 MG disintegrating tablet Take 1 tablet (4 mg total) by mouth every 8 (eight) hours as  needed for nausea or vomiting. 20 tablet 0  . oxyCODONE ER 13.5 MG C12A Take 13.5 mg by mouth 2 (two) times daily.    [name] oxyCODONE-acetaminophen (PERCOCET/ROXICET) 5-325 MG tablet Take 1 tablet by mouth every 6 (six) hours as needed for severe pain. 8 tablet 0  . phenazopyridine (PYRIDIUM) 200 MG tablet Take 1 tablet (200 mg total) by mouth 3 (three) times daily as needed for pain. 6 tablet 0  . promethazine (PHENERGAN) 25 MG tablet Take 1 tablet (25 mg total) by mouth as needed. (Patient not taking: No sig reported) 15 tablet 0  . QUEtiapine (SEROQUEL XR) 300 MG 24 hr tablet Take 1 tablet (300 mg total) by mouth at bedtime. 90 tablet 0  . tacrolimus (PROGRAF) 1 MG capsule Take 1 capsule by mouth once daily (Patient taking differently: Take 1 mg by mouth 2 (two) times daily.) 30 capsule 0   Current Facility-Administered Medications  Medication Dose Route Frequency Provider Last Rate Last Admin  . sulfamethoxazole-trimethoprim (BACTRIM DS) 800-160 MG per tablet 1 tablet  1 tablet Oral Q12H Hollice Espy, MD   1 tablet at 01/17/21 1432     Musculoskeletal: Strength & Muscle Tone: UTA Gait & Station: UTA Patient leans: N/A  Psychiatric Specialty Exam: Review of Systems  Constitutional: Positive for fatigue.  Musculoskeletal: Positive for myalgias.       Leg pain - BL   Psychiatric/Behavioral: Positive for decreased concentration and dysphoric mood. The patient is nervous/anxious.   All other systems reviewed and are negative.   Last menstrual period 02/04/2018.There is no height or weight on file to calculate BMI.  General Appearance: Fairly Groomed  Eye Contact:  Fair  Speech:  Clear and Coherent  Volume:  Normal  Mood:  Anxious and Depressed  Affect:  Congruent  Thought Process:  Goal Directed and Descriptions of Associations: Intact  Orientation:  Full (Time, Place, and Person)  Thought Content: Logical   Suicidal Thoughts:  No  Homicidal Thoughts:  No  Memory:  Immediate;    Fair Recent;   Fair Remote;   Fair  Judgement:  Fair  Insight:  Fair  Psychomotor Activity:  Normal  Concentration:  Concentration: Fair and Attention Span: Fair  Recall:  [employer] of Knowledge: Fair  Language: Fair  Akathisia:  No  Handed:  Right  AIMS (if indicated): UTA  Assets:  Communication Skills Desire for Improvement Housing Intimacy Social Support Talents/Skills  ADL's:  Intact  Cognition: WNL  Sleep:  Overall okay   Screenings: GAD-7   Flowsheet Row Office Visit from 12/04/2020 in [facility]  Total GAD-7 Score 5    PHQ2-9   Flowsheet Row Video Visit from 02/07/2021 in [place] Video Visit from 12/19/2020 in [place] Office Visit from 12/11/2020 in [facility] Office Visit from 12/04/2020 in [facility] Video Visit from 11/20/2020 in [place]  PHQ-2 Total Score 5 1 3 6  0  PHQ-9 Total Score 15 -- 5 17 --    Flowsheet Row ED from 01/18/2021 in [facility] Admission (Discharged) from 01/09/2021 in [place] ENDOSCOPY Video Visit from 12/19/2020 in [place] No Risk No Risk Low Risk       Assessment and Plan: Alison [name] is a 45 year old Caucasian female on disability, has a history of bipolar disorder, PTSD, history of borderline personality disorder, chronic pain, fibromyalgia, trigeminal neuralgia, history of liver transplant, B-cell lymphoma, SLE, diabetes melitis, OSA, migraine headaches was evaluated by telemedicine today.  Patient is currently struggling with worsening depressive symptoms, continues to struggle with pain.  She will benefit from the following plan.  Plan Bipolar disorder depressed-unstable Seroquel extended release 300 mg p.o. nightly. Discontinue Paxil for lack of  benefit Restart Cymbalta 60 mg p.o. daily I have reviewed notes per Ms.Tichadara RN -dated 02/06/2021- received in basket message from Dr. Valora Corporal Cymbalta use, 60 mg daily maximum.  Patient had reached out to MD because Cymbalta has been discontinued by a local provider thinking it was not safe for patient's liver.'  PTSD- improving Hydroxyzine 25 mg p.o. twice daily as needed for anxiety attacks Melatonin 5 to 10 mg p.o. nightly Continue CBT with therapist  Neuroleptic induced Parkinson's disease-stable Cogentin 1 mg p.o. daily as needed.  Patient to continue to follow-up with pain management for management of her fibromyalgia pain.  Follow-up in clinic in 3 weeks or sooner if needed.  This note was generated in part or whole with voice recognition software. Voice recognition is usually quite accurate but there are transcription errors that can and very often do occur. I apologize for any typographical errors that were not detected and corrected.        [name], MD 02/08/2021, 8:55 AM

## 2021-02-08 DIAGNOSIS — F3181 Bipolar II disorder: Secondary | ICD-10-CM | POA: Diagnosis not present

## 2021-02-08 DIAGNOSIS — F411 Generalized anxiety disorder: Secondary | ICD-10-CM | POA: Diagnosis not present

## 2021-02-08 DIAGNOSIS — F4310 Post-traumatic stress disorder, unspecified: Secondary | ICD-10-CM | POA: Diagnosis not present

## 2021-02-13 DIAGNOSIS — R32 Unspecified urinary incontinence: Secondary | ICD-10-CM | POA: Diagnosis not present

## 2021-02-13 DIAGNOSIS — M797 Fibromyalgia: Secondary | ICD-10-CM | POA: Diagnosis not present

## 2021-02-13 DIAGNOSIS — E7849 Other hyperlipidemia: Secondary | ICD-10-CM | POA: Diagnosis not present

## 2021-02-13 DIAGNOSIS — E669 Obesity, unspecified: Secondary | ICD-10-CM | POA: Diagnosis not present

## 2021-02-13 DIAGNOSIS — I10 Essential (primary) hypertension: Secondary | ICD-10-CM | POA: Diagnosis not present

## 2021-02-13 DIAGNOSIS — C8580 Other specified types of non-Hodgkin lymphoma, unspecified site: Secondary | ICD-10-CM | POA: Diagnosis not present

## 2021-02-13 DIAGNOSIS — K7581 Nonalcoholic steatohepatitis (NASH): Secondary | ICD-10-CM | POA: Diagnosis not present

## 2021-02-13 DIAGNOSIS — F3131 Bipolar disorder, current episode depressed, mild: Secondary | ICD-10-CM | POA: Diagnosis not present

## 2021-02-13 DIAGNOSIS — Z944 Liver transplant status: Secondary | ICD-10-CM | POA: Diagnosis not present

## 2021-02-13 DIAGNOSIS — R52 Pain, unspecified: Secondary | ICD-10-CM | POA: Diagnosis not present

## 2021-02-13 DIAGNOSIS — F411 Generalized anxiety disorder: Secondary | ICD-10-CM | POA: Diagnosis not present

## 2021-02-13 DIAGNOSIS — E1165 Type 2 diabetes mellitus with hyperglycemia: Secondary | ICD-10-CM | POA: Diagnosis not present

## 2021-02-19 ENCOUNTER — Other Ambulatory Visit: Payer: Self-pay

## 2021-02-19 NOTE — Patient Outreach (Signed)
Medicaid Managed Care    Pharmacy Note  02/19/2021 Name: Alison [name] MRN: [id number] DOB: 06-19-1976  Alison [name] is a 45 y.o. year old female who is a primary care patient of [place]. The [facility] Managed Care Coordination team was consulted for assistance with disease management and care coordination needs.    Engaged with patient Engaged with patient by telephone for follow up visit in response to referral for case management and/or care coordination services.  Alison [name] was given information about Managed Medicaid Care Coordination team services today. Alison [name] agreed to services and verbal consent obtained.   Objective:  Lab Results  Component Value Date   CREATININE 1.01 (H) 01/19/2021   CREATININE 1.05 (H) 12/22/2020   CREATININE 1.04 12/11/2020    Lab Results  Component Value Date   HGBA1C 8.9 (A) 12/11/2020       Component Value Date/Time   CHOL 219 (H) 12/11/2020 0000   TRIG 333 (H) 12/11/2020 0000   HDL 38 (L) 12/11/2020 0000   CHOLHDL 5.8 (H) 12/11/2020 0000   LDLCALC 133 (H) 12/11/2020 0000    Other: (TSH, CBC, Vit D, etc.)  Clinical ASCVD: No  The 10-year ASCVD risk score Mikey Bussing DC Jr., et al., 2013) is: 2.8%   Values used to calculate the score:     Age: 74 years     Sex: Female     Is Non-Hispanic African American: No     Diabetic: Yes     Tobacco smoker: No     Systolic Blood Pressure: 370 mmHg     Is BP treated: No     HDL Cholesterol: 38 mg/dL     Total Cholesterol: 219 mg/dL    Other: (CHADS2VASc if Afib, PHQ9 if depression, MMRC or CAT for COPD, ACT, DEXA)  BP Readings from Last 3 Encounters:  01/19/21 (!) 112/57  01/17/21 (!) 139/94  01/09/21 115/84    Assessment/Interventions: Review of patient past medical history, allergies, medications, health status, including review of consultants reports, laboratory and other test data, was performed as part of comprehensive evaluation and provision of  chronic care management services.   Tremors Benztropine 0.76m QD PRN Plan: Patient states she rarely has tremors   [facility]/PTSD Dr.Eappenfrom psychiatry [facility] 07/25/2020 06/05/2020 09/23/2019 09/16/2019 08/10/2019  Decreased Interest 3 3 3 1 1   Down, Depressed, Hopeless 3 3 3 1 3   PHQ - 2 Score 6 6 6 2 4   Altered sleeping 3 3 3  0 0  Tired, decreased energy 2 3 3 1  0  Change in appetite 2 2 3 1 1   Feeling bad or failure about yourself  2 1 0 0 0  Trouble concentrating 3 3 2 1  0  Moving slowly or fidgety/restless 0 0 2 0 0  Suicidal thoughts 0 0 0 0 -  PHQ-9 Score 18 18 19 5 5   Difficult doing work/chores Very difficult Very difficult - Not difficult at all -  Some recent data might be hidden    PHQ last results Paroxetine Hydroxyzine 228mPRN Severe Anxiety Lamotrigine 2591mD Quetiapine 300m64m Tried/Failed: Alprazolam, Duloxetine 90mg24m(60mg 45mnd 30mg A81mStopped due to potential liver damage) Plan: Defer to specialist  Liver Transplant 16 year54old Tacrolimus 1mg QD 7mn: At goal, maintain therapy   DM Endocrine manages Testing: 1/week (hates seeing bad numbers) Urine Albumin Metformin 1000mg [id number] Tried/Failed: Liraglutide QD (forgets to take 3-4x/week) Dec 2021 Plan: Will keep Liraglutide outside of  fridge in pill bag (Good for 30 days, informed patient of this), this should improve compliance. F/U March, after A1C in Feb. March 4403: Started on Trulicity (From Victoza) but hasn't picked up yet. Told her to pick up ASAP, will f/u 1 month April 2022: Patient has been using Trulicity but sugars are still running high. Couldn't read dates but gave me these readings 423,210,264, 207,323, 219, 300 ,[phone], Plan: Will ask PCP to add on therapy, patient is open to this. She'd also like to try a dietician. She's Vegetarian and eats a lot of pastas/carbs. May 2022: Meeting with Endo, has F/U on 02/21/21, states, "He doesn't fool  around. He's having me make a diary and test 4x/day. But my sugars have never looked better and sometimes are in the 100's. Was 150 yesterday (02/18/21) Plan: Defer to Endo, she is on the right track   HTN Lisinopril 67m Plan: At goal, maintain therapy  Pain States related to Chemo damage 09/11/20: Didn't give pain scale but states is content at this time Baclofen 285mTID PRN Pregabalin 2004mTAMPZA Plan: Patient at goal, maintain therapy  Gout Colchicine 0.6mg38mD 1/year Plan: Patient rarely has attacks, no preventative therapy needed at this time  Medications (Cost) -Patient states Medicaid only pays for 6 prescriptions/month -Verified with MichSharyn LullP at [facility], that she has never heard of this in state of [place] (It is true in other states). Per patient, she stated that she can do 6/month or a doctor can call and override it. Plan: Patient will STOP paying cash for medications and start using insurance, this should save her $50+/month  Medication (Operations) -Patient states she forgets to get refills often. Has a pill box but never uses. States she accidentally takes Duloxetine 60mg56m on accident instead of Duloxetine 60 QD and 30mg 67mould like to get medications in packaging but has to use [facility], due to her paying cash March 2022 Plan: Will work on out-of pocket expenses at this visit, will inquire about packaging next visit April 2022: Spoke with [facility], they said there is no such limitation on her medications. Tried to call patient back but she didn't answer 01/19/21 UPDATE: Patient called back and would like all meds delivered and send every 3 months. Requested refills of meds. Verbal consent obtained for [facility] enhanced pharmacy services (medication synchronization, adherence packaging, delivery coordination). A medication sync plan was created to allow patient to get all medications delivered once every 30 to 90 days per patient preference. Patient  understands they have freedom to choose pharmacy and clinical pharmacist will coordinate care between all prescribers and [facility]. 02/19/21: Medications have been delivered already, she's quite happy with the service, no issues/complaints      SDOH (Social Determinants of Health) assessments and interventions performed:    Care Plan  Allergies  Allergen Reactions  . Penicillin G Itching, Rash, Anaphylaxis and Palpitations  . Lamictal [Lamotrigine] Rash  . Carbamazepine Other (See Comments)    Medication interaction-prograf  . Hydrocodone-Acetaminophen Itching  . Naproxen Itching    Medications Reviewed Today    Reviewed by EappenUrsula AlertPhysician) on 02/07/21 at 1342  Med List Status: <None>  Medication Order Taking? Sig Documenting Provider Last Dose Status Informant  baclofen (LIORESAL) 20 MG tablet [id number] 1 tablet (20 mg total) by mouth 3 (three) times daily as needed for muscle spasms.  Patient taking differently: Take 20 mg by mouth 3 (three) times daily.   Boyce,[name]  01/19/2021 Unknown time Active   benztropine (COGENTIN) 1 MG tablet [id number] No Take 1 tablet (1 mg total) by mouth daily as needed for tremors. [name], MD Unknown PRN Active Self  cephALEXin (KEFLEX) 500 MG capsule [id number]  Take 1 capsule (500 mg total) by mouth 3 (three) times daily. Paulette Blanch, MD  Active   Dulaglutide (TRULICITY) 2.45 YK/9.9IP [facility] [id number] No Inject 0.75 mg into the skin once a week.  Patient taking differently: Inject 0.75 mg into the skin every Wednesday.   Myles Gip, DO 01/19/2021 Unknown time Active   DULoxetine (CYMBALTA) 30 MG capsule [id number] Yes Take 1 capsule (30 mg total) by mouth 2 (two) times daily. [name], MD  Active   glucose blood (COOL BLOOD GLUCOSE TEST STRIPS) test strip [id number]  Use as instructed Steele Sizer, MD  Active   Melatonin 10 MG TABS [id number] No Take 10 mg by mouth at bedtime as needed  (sleep). [provider] Unknown PRN Active Self  metFORMIN (GLUCOPHAGE) 1000 MG tablet [id number] No Take 1 tablet (1,000 mg total) by mouth 2 (two) times daily with a meal. Myles Gip, DO 01/19/2021 Unknown time Active Self  mirabegron ER (MYRBETRIQ) 50 MG TB24 tablet [id number] No Take 1 tablet (50 mg total) by mouth daily. Debroah Loop, PA-C 01/19/2021 Unknown time Active Self  Multiple Vitamin (MULTI-VITAMIN) tablet [id number] No Take 1 tablet by mouth daily. [provider] 01/19/2021 Unknown time Active Self  ondansetron (ZOFRAN ODT) 4 MG disintegrating tablet [id number]  Take 1 tablet (4 mg total) by mouth every 8 (eight) hours as needed for nausea or vomiting. Paulette Blanch, MD  Active   oxyCODONE ER 13.5 MG C12A [id number] No Take 13.5 mg by mouth 2 (two) times daily. [provider] 01/19/2021 Unknown time Active Self  oxyCODONE-acetaminophen (PERCOCET/ROXICET) 5-325 MG tablet [id number]  Take 1 tablet by mouth every 6 (six) hours as needed for severe pain. Paulette Blanch, MD  Active   phenazopyridine (PYRIDIUM) 200 MG tablet [id number]  Take 1 tablet (200 mg total) by mouth 3 (three) times daily as needed for pain. Paulette Blanch, MD  Active   promethazine (PHENERGAN) 25 MG tablet [id number] No Take 1 tablet (25 mg total) by mouth as needed.  Patient not taking: No sig reported   Myles Gip, DO Not Taking Unknown time Active Self  QUEtiapine (SEROQUEL XR) 300 MG 24 hr tablet [id number]  Take 1 tablet (300 mg total) by mouth at bedtime. [name], MD  Active   sulfamethoxazole-trimethoprim (BACTRIM DS) 800-160 MG per tablet 1 tablet [id number]   Hollice Espy, MD  Active   tacrolimus (PROGRAF) 1 MG capsule [id number] No Take 1 capsule by mouth once daily  Patient taking differently: Take 1 mg by mouth 2 (two) times daily.   [name], FNP 01/19/2021 Unknown time Active            Med Note [name], [name]   Fri Jan 19, 2021  5:01 AM)             Patient Active Problem List   Diagnosis Date Noted  . Hepatic cirrhosis ([place])   . Opioid dependence with opioid-induced disorder ([place]) 12/20/2020  . 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 Bipolar disorder, in full remission, most recent episode depressed ([place]) 09/27/2020  . Suprapubic pain 07/25/2020  . Bipolar 1 disorder, depressed, mild ([place]) 07/10/2020  . 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 Edema of lower extremity  09/16/2019  . Carpal tunnel syndrome, left 07/26/2019  . Cubital tunnel syndrome on left 07/26/2019  . Bipolar I disorder, most recent episode depressed ([place]) 07/12/2019  . PTSD (post-traumatic stress disorder) 07/12/2019  . Umbilical hernia without obstruction and without gangrene 06/17/2019  . Encounter for surveillance of abnormal nevi 06/17/2019  . Pineal gland cyst 06/17/2019  . Chronic hip pain, left 06/02/2019  . Lumbar spondylosis 06/02/2019  . Mouth dryness 06/02/2019  . Obesity (BMI 35.0-39.9 without comorbidity) 06/02/2019  . Goals of care, counseling/discussion 05/18/2019  . 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 Occipital neuralgia 05/13/2019  . Plantar fasciitis of left foot 04/27/2019  . Fibromyalgia 03/23/2019  . Systemic lupus erythematosus (SLE) in adult ([place]) 03/23/2019  . Essential hypertension 03/23/2019  . Hepatitis 03/23/2019  . Mass of left kidney 03/23/2019  . Diabetes mellitus ([place]) 03/23/2019  . Nausea without vomiting 03/23/2019  . Neuropathy 03/23/2019  . Cancer ([place]) 03/23/2019  . Hyperlipidemia 03/23/2019  . Osteoarthritis 03/23/2019  . Trigeminal neuralgia 03/23/2019  . Chronic renal disease, stage III ([place]) 03/23/2019  . Nephrolithiasis 03/23/2019  . Migraine 03/23/2019  . OSA on CPAP 03/23/2019  . Fibrocystic breast 03/23/2019  . Heart murmur 03/23/2019  .  Bipolar disorder, in partial remission, most recent episode depressed ([place]) 07/02/2017  . Abnormal uterine bleeding 03/18/2017  . Major depressive disorder, recurrent ([place]) 03/18/2017  . Morbid obesity ([place]) 03/18/2017  . Secondary hyperparathyroidism ([place]) 02/07/2017  . Neuropathic pain 11/19/2016  . Post herpetic neuralgia 11/19/2016  . 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 Lumbar disc disease with radiculopathy 07/26/2013  . S/P liver transplant ([place]) 07/26/2013  . Type 2 diabetes mellitus, uncontrolled ([place]) 07/26/2013  . Facial nerve disorder 06/29/2012  . Low back pain 12/26/2010    Conditions to be addressed/monitored: HTN, HLD and DM  Care Plan : Medication Management  Updates made by Lane Hacker, [place] since 12/25/2020 12:00 AM    Problem: Health Promotion or Disease Self-Management (General Plan of Care)     Goal: Self-Management Plan Developed   Note:   Current Barriers:  . Unable to achieve control of DM  .   Pharmacist Clinical Goal(s):  [name] Over the next 30 days, patient will achieve adherence to monitoring guidelines and medication adherence to achieve therapeutic efficacy . contact provider office for questions/concerns as evidenced notation of same in electronic health record through collaboration with PharmD and provider.  .   Interventions: . Inter-disciplinary care team collaboration (see longitudinal plan of care) . Comprehensive medication review performed; medication list updated in electronic medical record  @RXCPDIABETES @ @RXCPHYPERTENSION @ @RXCPHYPERLIPIDEMIA @  Patient Goals/Self-Care Activities . Over the next 30 days, patient will:  - take medications as prescribed collaborate with provider on medication access solutions  Follow Up Plan: The care management team will reach out to the patient again over the next 30 days.     Task: Mutually Develop and Royce Macadamia Achievement of Patient Goals   Note:   Care Management  Activities:    - verbalization of feelings encouraged    Notes:      Medication Assistance: None required. Patient affirms current coverage meets needs.   Follow up: Agree   Plan: The care management team will reach out to the patient again over the next 30 days.   [place] Constable, Pharm.D., Managed Medicaid Pharmacist - [phone]

## 2021-02-19 NOTE — Patient Instructions (Signed)
Visit Information  Ms. Gorelick was given information about Medicaid Managed Care team care coordination services as a part of their Healthy [facility] Medicaid benefit. Narcissus Detwiler verbally consented to engagement with the [facility] Managed Care team.   For questions related to your Healthy [facility] health plan, please call: [phone] or visit the homepage here: [email or web address]  If you would like to schedule transportation through your Healthy [facility] plan, please call the following number at least 2 days in advance of your appointment: [phone]   Call the [place] at [phone], at any time, 24 hours a day, 7 days a week. If you are in danger or need immediate medical attention call 911.  Ms. Groseclose - following are the goals we discussed in your visit today:  Goals Addressed   None     Please see education materials related to DM provided as print materials.   Patient verbalizes understanding of instructions provided today.   The Managed Medicaid care management team will reach out to the patient again over the next 30 days.   Arizona Constable, Pharm.D., Managed Medicaid Pharmacist [phone]   Following is a copy of your plan of care:  Patient Care Plan: Chronic Pain (Adult)    Problem Identified: Chronic Pain Management-fibromyalgia   Priority: High  Onset Date: 01/22/2021    Long-Range Goal: Fibromyalgia pain managed-new pain management provider   Start Date: 08/14/2020  Expected End Date: 05/24/2021  Recent Progress: On track  Priority: High  Note:   Current Barriers:  . Care Coordination needs related to pain management provider.   Patient is experiencing fibromyalgia flair and is interested in help being connected to a pain management provider. . Unable to independently secure referral or appointment for pain management provider. [name] Update 11/24/20:  Patient is being followed by Dr.  Humphrey Rolls.  Nurse Case Manager Clinical Goal(s):  [name], patient will work with [facility] to address needs related to referral for pain management provider and associated care coordination needs. [name] Update 09/22/20:  patient is currently seeing Dr. Humphrey Rolls at [facility].  Interventions:  . Inter-disciplinary care team collaboration (see longitudinal plan of care) . Evaluation of current treatment plan related to fibromyalgia  and patient's adherence to plan as established by provider. [name] with primary care provider regarding recommendations and referral to pain management provider. . Discussed plans with patient for ongoing care management follow up and provided patient with direct contact information for care management team . Anticipate pain education program, pain management support as part of pain management referral.      . [name] contacted patient to check in. Patient stated she has not contacted an eye doctor nor has she seen a specialist. Patient states she thinks she has COVID due to her husband having it. She has not taken a test yet, but has some symptoms. She is isolating in the bedroom and they have someone to cook and bring her food.       [name] Update 11/16/20: [name] completed phone call with patient, she states she is still having symptoms from [place] and has been in bed all month. Patient states she has not had a chance to contact an eye doctor or specialist due to being sick. [name] Update 11/24/20:  Patient with continued fatigue-will follow up with PCP. [name] Update 01/22/21:  patient continues to follow up with provider. . Pharmacy referral for medication review. [name] Update 01/22/21:  Patient met with  Pharmacist-continuing to follow.  Next appointment 02/19/21. [name] Update 12/14/20: [name] completed call with patient, she stated she is waiting on a call from [facility] about getting a liver biopsy completed. She has not reached out to the eye doctor yet because her liver is top  priority right now. [name] Update 12/22/20:  Patient seen by PCP 12/11/20.  Has not seen Opthamologist yet.  Had liver biopsy 12/20/20 due to increased liver enzymes.  [name] Update 01/15/21: [name] completed a follow up with patient. She stated she found out she has to have a liver transplant and is currently completing testing. Patient stated that she has been in pain since her pcp took her off of her pain medication. Patient stated her rent will be going up by $400 and they have to move. They will most likely move to [place] area because her fiance' works in that area. No resources needed at this time.  Patient Goals/Self-Care Activities Over the next 45 days, patient will:  -Attends all scheduled provider appointments  develop a personal pain management plan with your pain management provider.  Follow Up Plan:  The Managed Medicaid care management team will reach out to the patient again over the next 30 days.              Patient Care Plan: Wellness (Adult)    Problem Identified: Medication Adherence (Wellness)   Priority: High  Onset Date: 01/22/2021    Goal: Medication Adherence Maintained   Start Date: 09/06/2020  Expected End Date: 03/07/2021  Recent Progress: Not on track  Priority: High  Note:   Current Barriers:  . Patient states she needs to have prescriptions refilled and feels she has some side effects from her medications. [name] Update 12/22/20:  Patient's PCP discontinued Victoza and added Trulicity.  Patient has not been taking mediations as prescribed.  Hgb A1C 8.1 on 12/11/20. [name] Update 01/22/21:  Patient states she is taking medications.  She checks blood sugar once a day and needs more testing strips.  Blood sugars 200's per patient.  Nurse Case Manager Clinical Goal(s):  [name] Over the next 30 days, patient will work with CM team pharmacist to review current medications. [name] Update 01/22/21:  Patient met with Pharmacist and continues to follow-next appointment  02/19/21.  Interventions:  . Inter-disciplinary care team collaboration (see longitudinal plan of care) . Evaluation of current treatment plan and patient's adherence to plan as established by provider. . Advised patient to contact her PCP for any medication needs. . Reviewed medications with patient. [name] with pharmacy regarding medications.  . Discussed plans with patient for ongoing care management follow up and provided patient with direct contact information for care management team . Pharmacy referral for medication review . Update 12/22/20:  Patient has an appointment with Pharmacist 12/25/20. . Will notify PCP of need for testing strips and dietician referral.  Patient Goals/Self-Care Activities Over the next 30 days, patient will:  -Patient will take medications as prescribed. RNCM will notify PCP of need for testing strips and dietician referral. Calls pharmacy for medication refills Calls provider office for new concerns or questions  Follow Up Plan: The Managed Medicaid care management team will reach out to the patient again over the next 30 days.  The patient has been provided with contact information for the Managed Medicaid care management team and has been advised to call with any health related questions or concerns.       Patient Care Plan: Medication Management    Problem  Identified: Health Promotion or Disease Self-Management (General Plan of Care)     Goal: Self-Management Plan Developed   Note:   Current Barriers:  . Unable to achieve control of DM  .   Pharmacist Clinical Goal(s):  [name] Over the next 30 days, patient will achieve adherence to monitoring guidelines and medication adherence to achieve therapeutic efficacy . contact provider office for questions/concerns as evidenced notation of same in electronic health record through collaboration with PharmD and provider.  .   Interventions: . Inter-disciplinary care team collaboration (see  longitudinal plan of care) . Comprehensive medication review performed; medication list updated in electronic medical record  @RXCPDIABETES @ @RXCPHYPERTENSION @ @RXCPHYPERLIPIDEMIA @  Patient Goals/Self-Care Activities . Over the next 30 days, patient will:  - take medications as prescribed collaborate with provider on medication access solutions  Follow Up Plan: The care management team will reach out to the patient again over the next 30 days.     Task: Mutually Develop and Royce Macadamia Achievement of Patient Goals   Note:   Care Management Activities:    - verbalization of feelings encouraged    Notes:

## 2021-02-21 ENCOUNTER — Other Ambulatory Visit: Payer: Self-pay | Admitting: Obstetrics and Gynecology

## 2021-02-21 ENCOUNTER — Other Ambulatory Visit: Payer: Self-pay

## 2021-02-21 DIAGNOSIS — I10 Essential (primary) hypertension: Secondary | ICD-10-CM | POA: Diagnosis not present

## 2021-02-21 DIAGNOSIS — R32 Unspecified urinary incontinence: Secondary | ICD-10-CM | POA: Diagnosis not present

## 2021-02-21 DIAGNOSIS — E669 Obesity, unspecified: Secondary | ICD-10-CM | POA: Diagnosis not present

## 2021-02-21 DIAGNOSIS — R52 Pain, unspecified: Secondary | ICD-10-CM | POA: Diagnosis not present

## 2021-02-21 DIAGNOSIS — E1165 Type 2 diabetes mellitus with hyperglycemia: Secondary | ICD-10-CM | POA: Diagnosis not present

## 2021-02-21 DIAGNOSIS — C8580 Other specified types of non-Hodgkin lymphoma, unspecified site: Secondary | ICD-10-CM | POA: Diagnosis not present

## 2021-02-21 DIAGNOSIS — F3131 Bipolar disorder, current episode depressed, mild: Secondary | ICD-10-CM | POA: Diagnosis not present

## 2021-02-21 DIAGNOSIS — Z944 Liver transplant status: Secondary | ICD-10-CM | POA: Diagnosis not present

## 2021-02-21 DIAGNOSIS — Z6839 Body mass index (BMI) 39.0-39.9, adult: Secondary | ICD-10-CM | POA: Diagnosis not present

## 2021-02-21 DIAGNOSIS — E7849 Other hyperlipidemia: Secondary | ICD-10-CM | POA: Diagnosis not present

## 2021-02-21 DIAGNOSIS — K7581 Nonalcoholic steatohepatitis (NASH): Secondary | ICD-10-CM | POA: Diagnosis not present

## 2021-02-21 DIAGNOSIS — M797 Fibromyalgia: Secondary | ICD-10-CM | POA: Diagnosis not present

## 2021-02-21 NOTE — Patient Outreach (Signed)
Medicaid Managed Care   Nurse Care Manager Note  02/21/2021 Name:  Alison [name] MRN:  [id number] DOB:  1976/03/06  Alison [name] is an 45 y.o. year old female who is a primary patient of [facility].  The [facility] Managed Care Coordination team was consulted for assistance with:    chronic healthcare management needs.  Alison [name] was given information about Medicaid Managed Care Coordination team services today. Alison [name] agreed to services and verbal consent obtained.  Engaged with patient by telephone for follow up visit in response to provider referral for case management and/or care coordination services.   Assessments/Interventions:  Review of past medical history, allergies, medications, health status, including review of consultants reports, laboratory and other test data, was performed as part of comprehensive evaluation and provision of chronic care management services.  SDOH (Social Determinants of Health) assessments and interventions performed:   Care Plan  Allergies  Allergen Reactions  . Penicillin G Itching, Rash, Anaphylaxis and Palpitations  . Lamictal [Lamotrigine] Rash  . Carbamazepine Other (See Comments)    Medication interaction-prograf  . Hydrocodone-Acetaminophen Itching  . Naproxen Itching    Medications Reviewed Today    Reviewed by [name], RN (Registered Nurse) on 02/21/21 at 1038  Med List Status: <None>  Medication Order Taking? Sig Documenting Provider Last Dose Status Informant  baclofen (LIORESAL) 20 MG tablet [id number] No Take 1 tablet (20 mg total) by mouth 3 (three) times daily as needed for muscle spasms.  Patient taking differently: Take 20 mg by mouth 3 (three) times daily.   [name], FNP 01/19/2021 Unknown time Active   benztropine (COGENTIN) 1 MG tablet [id number] No Take 1 tablet (1 mg total) by mouth daily as needed for tremors. [name], MD Unknown PRN Active Self  cephALEXin (KEFLEX) 500  MG capsule [id number]  Take 1 capsule (500 mg total) by mouth 3 (three) times daily. Paulette Blanch, MD  Active   Dulaglutide (TRULICITY) 0.32 ZY/2.4MG Bonney Aid [id number] No Inject 0.75 mg into the skin once a week.  Patient taking differently: Inject 0.75 mg into the skin every Wednesday.   Myles Gip, DO 01/19/2021 Unknown time Active   DULoxetine (CYMBALTA) 30 MG capsule [id number]  Take 1 capsule (30 mg total) by mouth 2 (two) times daily. [name], MD  Active   glucose blood (COOL BLOOD GLUCOSE TEST STRIPS) test strip [id number]  Use as instructed Steele Sizer, MD  Active   Melatonin 10 MG TABS [id number] No Take 10 mg by mouth at bedtime as needed (sleep). [provider] Unknown PRN Active Self  metFORMIN (GLUCOPHAGE) 1000 MG tablet [id number] No Take 1 tablet (1,000 mg total) by mouth 2 (two) times daily with a meal. Myles Gip, DO 01/19/2021 Unknown time Active Self  mirabegron ER (MYRBETRIQ) 50 MG TB24 tablet [id number] No Take 1 tablet (50 mg total) by mouth daily. Debroah Loop, PA-C 01/19/2021 Unknown time Active Self  Multiple Vitamin (MULTI-VITAMIN) tablet [id number] No Take 1 tablet by mouth daily. [provider] 01/19/2021 Unknown time Active Self  ondansetron (ZOFRAN ODT) 4 MG disintegrating tablet [id number]  Take 1 tablet (4 mg total) by mouth every 8 (eight) hours as needed for nausea or vomiting. Paulette Blanch, MD  Active   oxyCODONE ER 13.5 MG C12A [id number] No Take 13.5 mg by mouth 2 (two) times daily. [provider] 01/19/2021 Unknown time Active Self  oxyCODONE-acetaminophen (PERCOCET/ROXICET) 5-325 MG tablet [id number]  Take 1 tablet by mouth  every 6 (six) hours as needed for severe pain. Paulette Blanch, MD  Active   phenazopyridine (PYRIDIUM) 200 MG tablet [id number]  Take 1 tablet (200 mg total) by mouth 3 (three) times daily as needed for pain. Paulette Blanch, MD  Active   promethazine (PHENERGAN) 25 MG tablet [id number] No Take 1 tablet  (25 mg total) by mouth as needed.  Patient not taking: No sig reported   Myles Gip, DO Not Taking Unknown time Active Self  QUEtiapine (SEROQUEL XR) 300 MG 24 hr tablet [id number]  Take 1 tablet (300 mg total) by mouth at bedtime. [name], MD  Active   sulfamethoxazole-trimethoprim (BACTRIM DS) 800-160 MG per tablet 1 tablet [id number]   Hollice Espy, MD  Active   tacrolimus (PROGRAF) 1 MG capsule [id number] No Take 1 capsule by mouth once daily  Patient taking differently: Take 1 mg by mouth 2 (two) times daily.   [name], FNP 01/19/2021 Unknown time Active            Med Note [name], [name]   Fri Jan 19, 2021  5:01 AM)            Patient Active Problem List   Diagnosis Date Noted  . Hepatic cirrhosis ([place])   . Opioid dependence with opioid-induced disorder ([place]) 12/20/2020  . 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 Bipolar disorder, in full remission, most recent episode depressed ([place]) 09/27/2020  . Suprapubic pain 07/25/2020  . Bipolar 1 disorder, depressed, mild ([place]) 07/10/2020  . 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 Edema of lower extremity 09/16/2019  . Carpal tunnel syndrome, left 07/26/2019  . Cubital tunnel syndrome on left 07/26/2019  . Bipolar I disorder, most recent episode depressed ([place]) 07/12/2019  . PTSD (post-traumatic stress disorder) 07/12/2019  . Umbilical hernia without obstruction and without gangrene 06/17/2019  . Encounter for surveillance of abnormal nevi 06/17/2019  . Pineal gland cyst 06/17/2019  . Chronic hip pain, left 06/02/2019  . Lumbar spondylosis 06/02/2019  . Mouth dryness 06/02/2019  . Obesity (BMI 35.0-39.9 without comorbidity) 06/02/2019  . Goals of care, counseling/discussion 05/18/2019  . 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 Occipital neuralgia  05/13/2019  . Plantar fasciitis of left foot 04/27/2019  . Fibromyalgia 03/23/2019  . Systemic lupus erythematosus (SLE) in adult ([place]) 03/23/2019  . Essential hypertension 03/23/2019  . Hepatitis 03/23/2019  . Mass of left kidney 03/23/2019  . Diabetes mellitus ([place]) 03/23/2019  . Nausea without vomiting 03/23/2019  . Neuropathy 03/23/2019  . Cancer ([place]) 03/23/2019  . Hyperlipidemia 03/23/2019  . Osteoarthritis 03/23/2019  . Trigeminal neuralgia 03/23/2019  . Chronic renal disease, stage III ([place]) 03/23/2019  . Nephrolithiasis 03/23/2019  . Migraine 03/23/2019  . OSA on CPAP 03/23/2019  . Fibrocystic breast 03/23/2019  . Heart murmur 03/23/2019  . Bipolar disorder, in partial remission, most recent episode depressed ([place]) 07/02/2017  . Abnormal uterine bleeding 03/18/2017  . Major depressive disorder, recurrent ([place]) 03/18/2017  . Morbid obesity ([place]) 03/18/2017  . Secondary hyperparathyroidism ([place]) 02/07/2017  . Neuropathic pain 11/19/2016  . Post herpetic neuralgia 11/19/2016  . 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[place]) 01/13/2015  . Lumbar disc disease with radiculopathy 07/26/2013  . S/P liver transplant ([place]) 07/26/2013  . Type 2 diabetes mellitus, uncontrolled ([place]) 07/26/2013  . Facial nerve disorder 06/29/2012  . Low back pain 12/26/2010    Conditions to  be addressed/monitored per PCP order:  chronic healthcare management needs,  history of bipolar disorder, PTSD, multiple medical problems including fibromyalgia, chronic pain, trigeminal neuralgia, history of liver transplant, B-cell lymphoma, SLE, diabetes melitis, OSA, migraine headaches   Care Plan : Chronic Pain (Adult)  Updates made by [name], RN since 02/21/2021 12:00 AM    Problem: Chronic Pain Management-fibromyalgia   Priority: High  Onset Date: 01/22/2021    Long-Range Goal: Fibromyalgia pain managed-new pain management provider   Start Date: 08/14/2020  Expected End Date: 05/24/2021  Recent  Progress: On track  Priority: High  Note:   Current Barriers:  . Care Coordination needs related to pain management provider.   Patient is experiencing fibromyalgia flair and is interested in help being connected to a pain management provider. . Unable to independently secure referral or appointment for pain management provider. [name] Update 11/24/20:  Patient is being followed by Dr. Humphrey Rolls.  Nurse Case Manager Clinical Goal(s):  [name], patient will work with [facility] to address needs related to referral for pain management provider and associated care coordination needs. [name] Update 09/22/20:  patient is currently seeing Dr. Humphrey Rolls at [facility].  Interventions:  . Inter-disciplinary care team collaboration (see longitudinal plan of care) . Evaluation of current treatment plan related to fibromyalgia  and patient's adherence to plan as established by provider. [name] Update 02/21/21:  Patient has restarted Cymbalta. [name] with primary care provider regarding recommendations and referral to pain management provider. . Discussed plans with patient for ongoing care management follow up and provided patient with direct contact information for care management team . Anticipate pain education program, pain management support as part of pain management referral.      . [name] contacted patient to check in. Patient stated she has not contacted an eye doctor nor has she seen a specialist. Patient states she thinks she has COVID due to her husband having it. She has not taken a test yet, but has some symptoms. She is isolating in the bedroom and they have someone to cook and bring her food.       [name] Update 11/16/20: [name] completed phone call with patient, she states she is still having symptoms from [place] and has been in bed all month. Patient states she has not had a chance to contact an eye doctor or specialist due to being sick. [name] Update 11/24/20:  Patient with continued  fatigue-will follow up with PCP. [name] Update 01/22/21:  patient continues to follow up with provider. . Pharmacy referral for medication review. [name] Update 01/22/21:  Patient met with Pharmacist-continuing to follow. [name] Update 12/14/20: [name] completed call with patient, she stated she is waiting on a call from [facility] about getting a liver biopsy completed. She has not reached out to the eye doctor yet because her liver is top priority right now. [name] Update 12/22/20:  Patient seen by PCP 12/11/20.  Has not seen Opthamologist yet.  Had liver biopsy 12/20/20 due to increased liver enzymes.  [name] Update 01/15/21: [name] completed a follow up with patient. She stated she found out she has to have a liver transplant and is currently completing testing. Patient stated that she has been in pain since her pcp took her off of her pain medication. Patient stated her rent will be going up by $400 and they have to move. They will most likely move to [place] area because her fiance' works in that area. No  resources needed at this time.  Patient Goals/Self-Care Activities Over the next 45 days, patient will:  -Attends all scheduled provider appointments  develop a personal pain management plan with your pain management provider.  Follow Up Plan:  The Managed Medicaid care management team will reach out to the patient again over the next 30 days.     Follow Up:  Patient agrees to Care Plan and Follow-up.  Plan: The Managed Medicaid care management team will reach out to the patient again over the next 30 days. and The patient has been provided with contact information for the Managed Medicaid care management team and has been advised to call with any health related questions or concerns.  Date/time of next scheduled RN care management/care coordination outreach:  03/20/21 at 0900.

## 2021-02-21 NOTE — Patient Instructions (Signed)
Hi Ms. [place]ne[place], thank you for speaking with me today, have a good day-I hope your appointment goes well this afternoon.  Ms. Alcivar was given information about Medicaid Managed Care team care coordination services as a part of their Healthy [facility] Medicaid benefit. Yianna Tersigni verbally consented to engagement with the [facility] Managed Care team.   For questions related to your Healthy [facility] health plan, please call: [phone] or visit the homepage here: [email or web address]  If you would like to schedule transportation through your Healthy [facility] plan, please call the following number at least 2 days in advance of your appointment: [phone]   Call the [facility] at [phone], at any time, 24 hours a day, 7 days a week. If you are in danger or need immediate medical attention call 911.  Ms. Podgurski - following are the goals we discussed in your visit today:  Goals Addressed            This Visit's Progress   . Chronic Pain Managed        Update 01/22/21:  Patient sees Dr. Humphrey Rolls for chronic pain management. Update 02/21/21:  Patient is restarting Cymbalta.  Evidence-based guidance:   Address common beliefs about pain, such as pain is to be endured, a normal part of aging or that it is not "real"; feelings of resignation that nothing can be done and that complaining will be a sign of weakness.   Assess pain level, treatment efficacy and patient response at regular intervals using a consistent pain scale.   Assess pain using self-report (most reliable), family/caregiver report, validated pain scale; consider impact on quality of life.   Determine if pain is associated with mobility or at rest, location, intensity, frequency, duration, recurrence, pattern and description (e.g., cramping, burning, aching), triggers and relieving factors.    Anticipate referral to pain education program, pain management support  or community resources for specific diagnoses (e.g., cancer, fibromyalgia, multiple sclerosis).   Explore fears associated with anticipated or imagined pain; encourage acceptance-based approaches.   Encourage exposure to experiences previously avoided due to fear of pain.   Anticipate referral to pain management specialist, physical therapist, addiction specialist (if history of substance use), psychotherapist; advocate for consultation with pharmacist.   Initiate nonpharmacologic measures, such as cognitive behavior therapy, mindfulness, guided imagery, massage, distraction, relaxation, chiropractic manipulation, dietary supplements or acupuncture.   Provide multimodal treatment interventions, such as physical activity, therapeutic exercise, yoga, TENS (transcutaneous electrical nerve stimulation) and manual therapy.   Train in functional activity modifications, such as body mechanics, posture, ergonomics, energy conservation and activity pacing.   Encourage use of local anesthetic or analgesic therapy as an adjunct for pain control (e.g., lidocaine patch, capsaicin cream, topical nonsteroidal anti-inflammatory drugs).   Prepare patient for use of pharmacologic therapy in a stepped approach that may include acetaminophen, nonsteroidal anti-inflammatory drugs, opioid, antiepileptic, antidepressant or nonbenzodiazepine muscle relaxant.  Review efficacy, tolerability, adherence and manage medication-induced side effects.        Patient verbalizes understanding of instructions provided today.   The Managed Medicaid care management team will reach out to the patient again over the next 30 days.  The patient has been provided with contact information for the Managed Medicaid care management team and has been advised to call with any health related questions or concerns.   [name], [name] [place]  [facility]ator - Managed Medicaid High  Risk [phone].  Following is a copy of your plan  of care:  Patient Care Plan: Chronic Pain (Adult)    Problem Identified: Chronic Pain Management-fibromyalgia   Priority: High  Onset Date: 01/22/2021    Long-Range Goal: Fibromyalgia pain managed-new pain management provider   Start Date: 08/14/2020  Expected End Date: 05/24/2021  Recent Progress: On track  Priority: High  Note:   Current Barriers:  . Care Coordination needs related to pain management provider.   Patient is experiencing fibromyalgia flair and is interested in help being connected to a pain management provider. . Unable to independently secure referral or appointment for pain management provider. [name] Update 11/24/20:  Patient is being followed by Dr. Humphrey Rolls.  Nurse Case Manager Clinical Goal(s):  [name], patient will work with [facility] to address needs related to referral for pain management provider and associated care coordination needs. [name] Update 09/22/20:  patient is currently seeing Dr. Humphrey Rolls at [facility].  Interventions:  . Inter-disciplinary care team collaboration (see longitudinal plan of care) . Evaluation of current treatment plan related to fibromyalgia  and patient's adherence to plan as established by provider. [name] Update 02/21/21:  Patient has restarted Cymbalta. [name] with primary care provider regarding recommendations and referral to pain management provider. . Discussed plans with patient for ongoing care management follow up and provided patient with direct contact information for care management team . Anticipate pain education program, pain management support as part of pain management referral.      . [name] contacted patient to check in. Patient stated she has not contacted an eye doctor nor has she seen a specialist. Patient states she thinks she has COVID due to her husband having it. She has not taken a test yet, but has some symptoms. She is isolating in the  bedroom and they have someone to cook and bring her food.       [name] Update 11/16/20: [name] completed phone call with patient, she states she is still having symptoms from [place] and has been in bed all month. Patient states she has not had a chance to contact an eye doctor or specialist due to being sick. [name] Update 11/24/20:  Patient with continued fatigue-will follow up with PCP. [name] Update 01/22/21:  patient continues to follow up with provider. . Pharmacy referral for medication review. [name] Update 01/22/21:  Patient met with Pharmacist-continuing to follow. [name] Update 12/14/20: [name] completed call with patient, she stated she is waiting on a call from [facility] about getting a liver biopsy completed. She has not reached out to the eye doctor yet because her liver is top priority right now. [name] Update 12/22/20:  Patient seen by PCP 12/11/20.  Has not seen Opthamologist yet.  Had liver biopsy 12/20/20 due to increased liver enzymes.  [name] Update 01/15/21: [name] completed a follow up with patient. She stated she found out she has to have a liver transplant and is currently completing testing. Patient stated that she has been in pain since her pcp took her off of her pain medication. Patient stated her rent will be going up by $400 and they have to move. They will most likely move to [place] area because her fiance' works in that area. No resources needed at this time.  Patient Goals/Self-Care Activities Over the next 45 days, patient will:  -Attends all scheduled provider appointments  develop a personal pain management plan with your pain management provider.  Follow Up Plan:  The Managed Medicaid care management team will reach out  to the patient again over the next 30 days.    Patient Care Plan: Wellness (Adult)    Problem Identified: Medication Adherence (Wellness)   Priority: High  Onset Date: 01/22/2021    Goal: Medication Adherence Maintained   Start Date: 09/06/2020  Expected End Date: 03/07/2021  Recent Progress:  Not on track  Priority: High  Note:   Current Barriers:  . Patient states she needs to have prescriptions refilled and feels she has some side effects from her medications. [name] Update 12/22/20:  Patient's PCP discontinued Victoza and added Trulicity.  Patient has not been taking mediations as prescribed.  Hgb A1C 8.1 on 12/11/20. [name] Update 01/22/21:  Patient states she is taking medications.  She checks blood sugar once a day and needs more testing strips.  Blood sugars 200's per patient. [name] Update 02/21/21:  Patient is checking blood sugars 4 times a day-blood sugars 130's per patient.  Has appointment with endocrinologist today-is keeping food diary as well.  Nurse Case Manager Clinical Goal(s):  [name] Over the next 30 days, patient will work with CM team pharmacist to review current medications. [name] Update 01/22/21:  Patient met with Pharmacist and continues to follow.  Interventions:  . Inter-disciplinary care team collaboration (see longitudinal plan of care) . Evaluation of current treatment plan and patient's adherence to plan as established by provider. . Advised patient to contact her PCP for any medication needs. . Reviewed medications with patient. [name] with pharmacy regarding medications.  . Discussed plans with patient for ongoing care management follow up and provided patient with direct contact information for care management team . Pharmacy referral for medication review . Will notify PCP of need for testing strips and dietician referral. . Update 02/21/21:  Patient has appointment with nutritionist 03/28/21 and has all testing supplies.  Patient Goals/Self-Care Activities Over the next 30 days, patient will:  -Patient will take medications as prescribed. Calls pharmacy for medication refills Calls provider office for new concerns or questions  Follow Up Plan: The Managed Medicaid care management team will reach out to the patient again over the next 30 days.  The patient has  been provided with contact information for the Managed Medicaid care management team and has been advised to call with any health related questions or concerns.

## 2021-03-11 DIAGNOSIS — M5412 Radiculopathy, cervical region: Secondary | ICD-10-CM | POA: Diagnosis not present

## 2021-03-12 ENCOUNTER — Other Ambulatory Visit: Payer: Self-pay

## 2021-03-12 ENCOUNTER — Encounter: Payer: Self-pay | Admitting: Psychiatry

## 2021-03-12 ENCOUNTER — Telehealth (INDEPENDENT_AMBULATORY_CARE_PROVIDER_SITE_OTHER): Payer: Medicaid Other | Admitting: Psychiatry

## 2021-03-12 DIAGNOSIS — F3161 Bipolar disorder, current episode mixed, mild: Secondary | ICD-10-CM

## 2021-03-12 DIAGNOSIS — G2111 Neuroleptic induced parkinsonism: Secondary | ICD-10-CM

## 2021-03-12 DIAGNOSIS — F4310 Post-traumatic stress disorder, unspecified: Secondary | ICD-10-CM

## 2021-03-12 MED ORDER — QUETIAPINE FUMARATE ER 50 MG PO TB24
50.0000 mg | ORAL_TABLET | Freq: Every day | ORAL | 0 refills | Status: DC
Start: 2021-03-12 — End: 2021-06-18

## 2021-03-12 NOTE — Progress Notes (Signed)
Virtual Visit via Video Note  I connected with Alison [name] on 03/12/21 at  2:00 PM EDT by a video enabled telemedicine application and verified that I am speaking with the correct person using two identifiers. Location Provider Location : Office Patient Location : Home  Participants: Patient , Provider   I discussed the limitations of evaluation and management by telemedicine and the availability of in person appointments. The patient expressed understanding and agreed to proceed.     I discussed the assessment and treatment plan with the patient. The patient was provided an opportunity to ask questions and all were answered. The patient agreed with the plan and demonstrated an understanding of the instructions.   The patient was advised to call back or seek an in-person evaluation if the symptoms worsen or if the condition fails to improve as anticipated.   [place] MD OP Progress Note  03/12/2021 2:28 PM Alison [name]  MRN:  [id number]  Chief Complaint:  Chief Complaint    Follow-up; hypomanic; Insomnia     HPI: Alison [name] is a 45 year old Caucasian female, lives in [place], has a history of bipolar disorder, PTSD, multiple medical problems including fibromyalgia, chronic pain, trigeminal neuralgia, history of liver transplant, B-cell lymphoma, SLE, diabetes melitis, OSA, migraine headache was evaluated by telemedicine today.  Patient today reports she has been having hypomanic symptoms since the past few days.  She also reports anxiety symptoms.  She has been busy with a lot of activities, quilting as well as doing [employer] native craft.  She hence bought a lot of things recently to do her craft work recently.   Patient does report sleep problems.  She reports anything can wake her up.  When she wakes up she is unable to fall asleep for 4 hours or so.  She is currently taking the Seroquel at night.  She currently struggles with neck pain.  She reports she had to go to the  urgent care yesterday and was started on Medrol Dosepak and methocarbamol.  She has not picked up from the pharmacy yet.  She is planning to do so later today.  Patient denies any suicidality, homicidality or perceptual disturbances.  Patient denies any other concerns today.  Visit Diagnosis:    ICD-10-CM   1. Bipolar 1 disorder, mixed, mild (HCC)  F31.61 QUEtiapine (SEROQUEL XR) 50 MG TB24 24 hr tablet  2. PTSD (post-traumatic stress disorder)  F43.10   3. 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 Abnormal uterine bleeding   . Allergy   . Anxiety   . Arthritis   . Bipolar disorder (manic depression) ([place])   . Chronic kidney failure   . Chronic renal disease, stage III ([place])   . Diabetes mellitus without complication ([place])   . 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  . FH: trigeminal neuralgia   . GERD (gastroesophageal reflux disease)   . Heart murmur   . Hypertension   . Kidney mass   . Lupus ([place])   . Lupus ([place])   . Major depressive disorder   . 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 Migraine   . Morbid obesity ([place])   . Neuromuscular disorder (HCC)    neuropathy  . Neuropathy   .  Personality disorder ([place])   . Post herpetic neuralgia   . PTSD (post-traumatic stress disorder)   . Renal disorder   . S/P liver transplant [facility])     Past Surgical History:  Procedure Laterality Date  . BONE MARROW BIOPSY  01/13/2015  . BREAST BIOPSY  12/2014  . BREAST BIOPSY  2011  . BREAST SURGERY    . CHOLECYSTECTOMY    . COLONOSCOPY    . ESOPHAGOGASTRODUODENOSCOPY    . ESOPHAGOGASTRODUODENOSCOPY (EGD) WITH PROPOFOL N/A 01/09/2021   Procedure: ESOPHAGOGASTRODUODENOSCOPY (EGD) WITH PROPOFOL;  Surgeon: [name], MD;   Location[facility] ENDOSCOPY;  Service: Endoscopy;  Laterality: N/A;  . HERNIA REPAIR    . infusaport    . LIVER TRANSPLANT  12/17/1991  . LUMBAR PUNCTURE    . PORT A CATH INJECTION ([place] HX)    . tumor removal  2015    Family Psychiatric History: I have reviewed family psychiatric history from progress note on 06/10/2019  Family History:  Family History  Problem Relation Age of Onset  . Heart disease Mother   . Hypertension Mother   . Cancer - Other Mother   . Bipolar disorder Mother   . Heart disease Father   . Hypertension Father   . Diabetes Father   . Parkinson's disease Maternal Grandmother   . Cancer Maternal Aunt   . Cancer Maternal Uncle   . Cancer Maternal Grandfather   . Lupus Paternal Grandmother   . Hypertension Brother     Social History: I have reviewed social history from progress note on 06/10/2019 Social History   Socioeconomic History  . Marital status: Single    Spouse name: Not on file  . Number of children: 0  . Years of education: 9  . Highest education level: GED or equivalent  Occupational History  . Occupation: disabled  Tobacco Use  . Smoking status: Never Smoker  . Smokeless tobacco: Never Used  Vaping Use  . Vaping Use: Never used  Substance and Sexual Activity  . Alcohol use: Not Currently  . Drug use: Not Currently    Types: Marijuana    Comment: pain managment last used in early april  . Sexual activity: Not Currently  Other Topics Concern  . Not on file  Social History Narrative  . Not on file   Social Determinants of Health   Financial Resource Strain: Not on file  Food Insecurity: Not on file  Transportation Needs: Not on file  Physical Activity: Not on file  Stress: Not on file  Social Connections: Not on file    Allergies:  Allergies  Allergen Reactions  . Penicillin G Itching, Rash, Anaphylaxis and Palpitations  . Lamictal [Lamotrigine] Rash  . Carbamazepine Other (See Comments)    Medication interaction-prograf  .  Hydrocodone-Acetaminophen Itching  . Naproxen Itching    Metabolic Disorder Labs: Lab Results  Component Value Date   HGBA1C 8.9 (A) 12/11/2020   MPG 209 06/05/2020   Lab Results  Component Value Date   PROLACTIN 13.4 07/15/2019   PROLACTIN 21.3 05/24/2019   Lab Results  Component Value Date   CHOL 219 (H) 12/11/2020   TRIG 333 (H) 12/11/2020   HDL 38 (L) 12/11/2020   CHOLHDL 5.8 (H) 12/11/2020   LDLCALC 133 (H) 12/11/2020   LDLCALC 111 (H) 06/05/2020   Lab Results  Component Value Date   TSH 2.650 07/15/2019   TSH 4.650 (H) 05/24/2019    Therapeutic Level Labs: No results found for: LITHIUM No results found  for: VALPROATE No components found for:  CBMZ  Current Medications: Current Outpatient Medications  Medication Sig Dispense Refill  . QUEtiapine (SEROQUEL XR) 50 MG TB24 24 hr tablet Take 1 tablet (50 mg total) by mouth at bedtime. Take along with 300 mg at bedtime 90 tablet 0  . baclofen (LIORESAL) 20 MG tablet Take 1 tablet (20 mg total) by mouth 3 (three) times daily as needed for muscle spasms. (Patient taking differently: Take 20 mg by mouth 3 (three) times daily.) 90 each 2  . benztropine (COGENTIN) 1 MG tablet Take 1 tablet (1 mg total) by mouth daily as needed for tremors. 30 tablet 1  . cephALEXin (KEFLEX) 500 MG capsule Take 1 capsule (500 mg total) by mouth 3 (three) times daily. 21 capsule 0  . Dulaglutide (TRULICITY) 2.37 SE/8.3TD SOPN Inject 0.75 mg into the skin once a week. (Patient taking differently: Inject 0.75 mg into the skin every Wednesday.) 3 mL 3  . DULoxetine (CYMBALTA) 30 MG capsule Take 1 capsule (30 mg total) by mouth 2 (two) times daily. 180 capsule 0  . ezetimibe (ZETIA) 10 MG tablet Take 10 mg by mouth daily.    [name] glucose blood (COOL BLOOD GLUCOSE TEST STRIPS) test strip Use as instructed 100 each 12  . Melatonin 10 MG TABS Take 10 mg by mouth at bedtime as needed (sleep).    . metFORMIN (GLUCOPHAGE) 1000 MG tablet Take 1 tablet  (1,000 mg total) by mouth 2 (two) times daily with a meal. 180 tablet 3  . methocarbamol (ROBAXIN) 500 MG tablet methocarbamol 500 mg tablet  Take 1 tablet twice a day by oral route as needed for 7 days.    . mirabegron ER (MYRBETRIQ) 50 MG TB24 tablet Take 1 tablet (50 mg total) by mouth daily. 90 tablet 3  . Multiple Vitamin (MULTI-VITAMIN) tablet Take 1 tablet by mouth daily.    . ondansetron (ZOFRAN ODT) 4 MG disintegrating tablet Take 1 tablet (4 mg total) by mouth every 8 (eight) hours as needed for nausea or vomiting. 20 tablet 0  . oxyCODONE ER 13.5 MG C12A Take 13.5 mg by mouth 2 (two) times daily.    [name] oxyCODONE-acetaminophen (PERCOCET/ROXICET) 5-325 MG tablet Take 1 tablet by mouth every 6 (six) hours as needed for severe pain. 8 tablet 0  . phenazopyridine (PYRIDIUM) 200 MG tablet Take 1 tablet (200 mg total) by mouth 3 (three) times daily as needed for pain. 6 tablet 0  . pregabalin (LYRICA) 200 MG capsule Take 200 mg by mouth 2 (two) times daily.    . promethazine (PHENERGAN) 25 MG tablet Take 1 tablet (25 mg total) by mouth as needed. (Patient not taking: No sig reported) 15 tablet 0  . QUEtiapine (SEROQUEL XR) 300 MG 24 hr tablet Take 1 tablet (300 mg total) by mouth at bedtime. 90 tablet 0  . tacrolimus (PROGRAF) 1 MG capsule Take 1 capsule by mouth once daily (Patient taking differently: Take 1 mg by mouth 2 (two) times daily.) 30 capsule 0   Current Facility-Administered Medications  Medication Dose Route Frequency Provider Last Rate Last Admin  . sulfamethoxazole-trimethoprim (BACTRIM DS) 800-160 MG per tablet 1 tablet  1 tablet Oral Q12H Hollice Espy, MD   1 tablet at 01/17/21 1432     Musculoskeletal: Strength & Muscle Tone: UTA Gait & Station: UTA Patient leans: N/A  Psychiatric Specialty Exam: Review of Systems  Musculoskeletal: Positive for neck pain.  Psychiatric/Behavioral: Positive for sleep disturbance. The patient is nervous/anxious.  All other systems  reviewed and are negative.   Last menstrual period 02/04/2018.There is no height or weight on file to calculate BMI.  General Appearance: Casual  Eye Contact:  Fair  Speech:  Normal Rate  Volume:  Normal  Mood:  Anxious, hypomanic  Affect:  Congruent  Thought Process:  Goal Directed and Descriptions of Associations: Intact  Orientation:  Full (Time, Place, and Person)  Thought Content: Logical   Suicidal Thoughts:  No  Homicidal Thoughts:  No  Memory:  Immediate;   Fair Recent;   Fair Remote;   Fair  Judgement:  Fair  Insight:  Fair  Psychomotor Activity:  Normal  Concentration:  Concentration: Fair and Attention Span: Fair  Recall:  [employer] of Knowledge: Fair  Language: Fair  Akathisia:  No  Handed:  Right  AIMS (if indicated): UTA  Assets:  Communication Skills Desire for Improvement Housing Intimacy Social Support  ADL's:  Intact  Cognition: WNL  Sleep:  Poor   Screenings: GAD-7   Flowsheet Row [facility]04/[facility] in [facility]  Total GAD-7 Score 5    PHQ2-9   Flowsheet Row Video Visit from 02/07/2021 in [place] Video Visit from 12/19/2020 in [place] Office Visit from 12/11/2020 in [facility] Office Visit from 12/04/2020 in [facility] Video Visit from 11/20/2020 in [place]  PHQ-2 Total Score _0 0  PHQ-9 Total Score 15 -- 5 17 --    Flowsheet Row ED from 01/18/2021 in [place] Admission (Discharged) from 01/09/2021 in [facility] ENDOSCOPY Video Visit from 12/19/2020 in [place] No Risk No Risk Low Risk       Assessment and Plan: Harleyquinn Gasser is a 45 year old Caucasian female on disability, has a history of bipolar disorder, PTSD, history of borderline personality disorder,  chronic pain, fibromyalgia, trigeminal neuralgia, history of liver transplant, B-cell lymphoma, SLE, diabetes melitis, OSA, migraine headache was evaluated by telemedicine today.  Patient is currently struggling with neck pain as well as possible hypomanic symptoms and anxiety, she will benefit from the following plan.  Plan Bipolar disorder mixed mild-unstable Increase Seroquel extended release to 350 mg p.o. nightly Cymbalta 60 mg p.o. daily Discussed sleep hygiene techniques.  PTSD-improving Hydroxyzine 25 mg p.o. 3 times daily as needed for anxiety attacks Melatonin 5 to 10 mg p.o. nightly  Neuroleptic induced Parkinson's disease-stable Cogentin 1 mg p.o. daily as needed  Patient advised to continue to follow-up with her providers for pain management.  Follow-up in clinic in 4 weeks or sooner if needed.  This note was generated in part or whole with voice recognition software. Voice recognition is usually quite accurate but there are transcription errors that can and very often do occur. I apologize for any typographical errors that were not detected and corrected.      [name], MD 03/12/2021, 2:28 PM

## 2021-03-14 DIAGNOSIS — G500 Trigeminal neuralgia: Secondary | ICD-10-CM | POA: Diagnosis not present

## 2021-03-14 DIAGNOSIS — Z114 Encounter for screening for human immunodeficiency virus [HIV]: Secondary | ICD-10-CM | POA: Diagnosis not present

## 2021-03-14 DIAGNOSIS — R202 Paresthesia of skin: Secondary | ICD-10-CM | POA: Diagnosis not present

## 2021-03-14 DIAGNOSIS — E538 Deficiency of other specified B group vitamins: Secondary | ICD-10-CM | POA: Diagnosis not present

## 2021-03-14 DIAGNOSIS — G5622 Lesion of ulnar nerve, left upper limb: Secondary | ICD-10-CM | POA: Diagnosis not present

## 2021-03-14 DIAGNOSIS — E038 Other specified hypothyroidism: Secondary | ICD-10-CM | POA: Diagnosis not present

## 2021-03-14 DIAGNOSIS — E519 Thiamine deficiency, unspecified: Secondary | ICD-10-CM | POA: Diagnosis not present

## 2021-03-14 DIAGNOSIS — E559 Vitamin D deficiency, unspecified: Secondary | ICD-10-CM | POA: Diagnosis not present

## 2021-03-14 DIAGNOSIS — M797 Fibromyalgia: Secondary | ICD-10-CM | POA: Diagnosis not present

## 2021-03-14 DIAGNOSIS — G43119 Migraine with aura, intractable, without status migrainosus: Secondary | ICD-10-CM | POA: Diagnosis not present

## 2021-03-14 DIAGNOSIS — E531 Pyridoxine deficiency: Secondary | ICD-10-CM | POA: Diagnosis not present

## 2021-03-16 DIAGNOSIS — F4310 Post-traumatic stress disorder, unspecified: Secondary | ICD-10-CM | POA: Diagnosis not present

## 2021-03-16 DIAGNOSIS — F411 Generalized anxiety disorder: Secondary | ICD-10-CM | POA: Diagnosis not present

## 2021-03-16 DIAGNOSIS — F3181 Bipolar II disorder: Secondary | ICD-10-CM | POA: Diagnosis not present

## 2021-03-20 ENCOUNTER — Other Ambulatory Visit: Payer: Self-pay | Admitting: Obstetrics and Gynecology

## 2021-03-20 ENCOUNTER — Other Ambulatory Visit: Payer: Self-pay

## 2021-03-20 NOTE — Patient Instructions (Signed)
Visit Information  Ms. Brutus was given information about Medicaid Managed Care team care coordination services as a part of their Healthy [facility] Medicaid benefit. Alison [name] verbally consented to engagement with the [facility] Managed Care team.   For questions related to your Healthy [facility] health plan, please call: [phone] or visit the homepage here: [email or web address]  If you would like to schedule transportation through your Healthy [facility] plan, please call the following number at least 2 days in advance of your appointment: [phone]   Call the [place] at [phone], at any time, 24 hours a day, 7 days a week. If you are in danger or need immediate medical attention call 911.  [name] - following are the goals we discussed in your visit today:   Goals Addressed             This Visit's Progress    Chronic Pain Managed        Update 01/22/21:  Patient sees Dr. Humphrey Rolls for chronic pain management. Update 02/21/21:  Patient is restarting Cymbalta. Update 03/20/21:  Chronic pain stable per patient-no complaints today-recently saw Dr. Humphrey Rolls  Evidence-based guidance:  Address common beliefs about pain, such as pain is to be endured, a normal part of aging or that it is not "real"; feelings of resignation that nothing can be done and that complaining will be a sign of weakness.  Assess pain level, treatment efficacy and patient response at regular intervals using a consistent pain scale.  Assess pain using self-report (most reliable), family/caregiver report, validated pain scale; consider impact on quality of life.  Determine if pain is associated with mobility or at rest, location, intensity, frequency, duration, recurrence, pattern and description (e.g., cramping, burning, aching), triggers and relieving factors.   Anticipate referral to pain education program, pain management support or community  resources for specific diagnoses (e.g., cancer, fibromyalgia, multiple sclerosis).  Explore fears associated with anticipated or imagined pain; encourage acceptance-based approaches.  Encourage exposure to experiences previously avoided due to fear of pain.  Anticipate referral to pain management specialist, physical therapist, addiction specialist (if history of substance use), psychotherapist; advocate for consultation with pharmacist.  Initiate nonpharmacologic measures, such as cognitive behavior therapy, mindfulness, guided imagery, massage, distraction, relaxation, chiropractic manipulation, dietary supplements or acupuncture.  Provide multimodal treatment interventions, such as physical activity, therapeutic exercise, yoga, TENS (transcutaneous electrical nerve stimulation) and manual therapy.  Train in functional activity modifications, such as body mechanics, posture, ergonomics, energy conservation and activity pacing.  Encourage use of local anesthetic or analgesic therapy as an adjunct for pain control (e.g., lidocaine patch, capsaicin cream, topical nonsteroidal anti-inflammatory drugs).  Prepare patient for use of pharmacologic therapy in a stepped approach that may include acetaminophen, nonsteroidal anti-inflammatory drugs, opioid, antiepileptic, antidepressant or nonbenzodiazepine muscle relaxant.  Review efficacy, tolerability, adherence and manage medication-induced side effects.         Patient verbalizes understanding of instructions provided today.   The Managed Medicaid care management team will reach out to the patient again over the next 30 days.  The patient has been provided with contact information for the Managed Medicaid care management team and has been advised to call with any health related questions or concerns.   [name], [name] [place]  [facility] - Managed Medicaid High Risk [phone].    Following is  a copy of your plan of care:  Patient Care Plan: Chronic Pain (Adult)     Problem  Identified: Chronic Pain Management-fibromyalgia   Priority: High  Onset Date: 01/22/2021     Long-Range Goal: Fibromyalgia pain managed-new pain management provider   Start Date: 08/14/2020  Expected End Date: 05/24/2021  Recent Progress: On track  Priority: High  Note:   Current Barriers:  Care Coordination needs related to pain management provider.   Patient is experiencing fibromyalgia flair and is interested in help being connected to a pain management provider. Unable to independently secure referral or appointment for pain management provider. Update 11/24/20:  Patient is being followed by Dr. Humphrey Rolls.  Nurse Case Manager Clinical Goal(s):  Over the next 45 days, patient will work with [facility] to address needs related to referral for pain management provider and associated care coordination needs. Update 09/22/20:  patient is currently seeing Dr. Humphrey Rolls at [facility].  Interventions:  Inter-disciplinary care team collaboration (see longitudinal plan of care) Evaluation of current treatment plan related to fibromyalgia  and patient's adherence to plan as established by provider. Update 02/21/21:  Patient has restarted Cymbalta. Collaborated with primary care provider regarding recommendations and referral to pain management provider. Discussed plans with patient for ongoing care management follow up and provided patient with direct contact information for care management team Anticipate pain education program, pain management support as part of pain management referral.      [name] contacted patient to check in. Patient stated she has not contacted an eye doctor nor has she seen a specialist. Patient states she thinks she has COVID due to her husband having it. She has not taken a test yet, but has some symptoms. She is isolating in the bedroom and they have someone to cook and bring her  food.       Update 11/16/20: [name] completed phone call with patient, she states she is still having symptoms from [place] and has been in bed all month. Patient states she has not had a chance to contact an eye doctor or specialist due to being sick. Update 11/24/20:  Patient with continued fatigue-will follow up with PCP. Update 01/22/21:  patient continues to follow up with provider. Pharmacy referral for medication review. Update 01/22/21:  Patient met with Pharmacist-continuing to follow. Update 12/14/20: [name] completed call with patient, she stated she is waiting on a call from [facility] about getting a liver biopsy completed. She has not reached out to the eye doctor yet because her liver is top priority right now. Update 12/22/20:  Patient seen by PCP 12/11/20.  Has not seen Opthamologist yet.  Had liver biopsy 12/20/20 due to increased liver enzymes.  Update 01/15/21: [name] completed a follow up with patient. She stated she found out she has to have a liver transplant and is currently completing testing. Patient stated that she has been in pain since her pcp took her off of her pain medication. Patient stated her rent will be going up by $400 and they have to move. They will most likely move to [place] area because her fiance' works in that area. No resources needed at this time. Update 03/20/21:  Pain states no pain at this time.  Has decided to stay in current apartment.  Patient states her liver function tests have improved and she is stable right now.  Patient Goals/Self-Care Activities Over the next 45 days, patient will:  -Attends all scheduled provider appointments  develop a personal pain management plan with your pain management provider.  Follow Up Plan:  The Managed Medicaid care management team will reach  out to the patient again over the next 30 days.     Patient Care Plan: Wellness (Adult)     Problem Identified: Medication Adherence (Wellness)   Priority: High  Onset Date: 01/22/2021      Goal: Medication Adherence Maintained   Start Date: 09/06/2020  Expected End Date: 05/24/2021  Recent Progress: Not on track  Priority: High  Note:   Current Barriers:  Patient states she needs to have prescriptions refilled and feels she has some side effects from her medications. Update 12/22/20:  Patient's PCP discontinued Victoza and added Trulicity.  Patient has not been taking mediations as prescribed.  Hgb A1C 8.1 on 12/11/20. Update 01/22/21:  Patient states she is taking medications.  She checks blood sugar once a day and needs more testing strips.  Blood sugars 200's per patient. Update 02/21/21:  Patient is checking blood sugars 4 times a day.  Has appointment with endocrinologist today and is keeping food diary. Update 03/20/21:  Blood sugars mid 100's perpatient-has appointment with nutritionist 03/28/21.  Nurse Case Manager Clinical Goal(s):  Over the next 30 days, patient will work with CM team pharmacist to review current medications. Update 01/22/21:  Patient met with Pharmacist and continues to follow.  Interventions:  Inter-disciplinary care team collaboration (see longitudinal plan of care) Evaluation of current treatment plan and patient's adherence to plan as established by provider. Advised patient to contact her PCP for any medication needs. Reviewed medications with patient. Collaborated with pharmacy regarding medications.  Discussed plans with patient for ongoing care management follow up and provided patient with direct contact information for care management team Pharmacy referral for medication review. Will notify PCP of need for testing strips and dietician referral. Update 02/21/21:  Patient has appointment with nutritionist 03/28/21 and has all testing supplies needed.  Patient Goals/Self-Care Activities Over the next 30 days, patient will:  -Patient will take medications as prescribed. Calls pharmacy for medication refills Calls provider office for new  concerns or questions  Follow Up Plan: The Managed Medicaid care management team will reach out to the patient again over the next 30 days.  The patient has been provided with contact information for the Managed Medicaid care management team and has been advised to call with any health related questions or concerns.

## 2021-03-20 NOTE — Patient Outreach (Signed)
Medicaid Managed Care   Nurse Care Manager Note  03/20/2021 Name:  Alison [name] MRN:  [id number] DOB:  1976/01/28  Alison [name] is an 45 y.o. year old female who is a primary patient of [place].  The [facility] Managed Care Coordination team was consulted for assistance with:    Chronic healthcare management needs.  Ms. Muckey was given information about Medicaid Managed Care Coordination team services today. Alison [name] agreed to services and verbal consent obtained.  Engaged with patient by telephone for follow up visit in response to provider referral for case management and/or care coordination services.   Assessments/Interventions:  Review of past medical history, allergies, medications, health status, including review of consultants reports, laboratory and other test data, was performed as part of comprehensive evaluation and provision of chronic care management services.  SDOH (Social Determinants of Health) assessments and interventions performed:   Care Plan  Allergies  Allergen Reactions   Penicillin G Itching, Rash, Anaphylaxis and Palpitations   Lamictal [Lamotrigine] Rash   Carbamazepine Other (See Comments)    Medication interaction-prograf   Hydrocodone-Acetaminophen Itching   Naproxen Itching    Medications Reviewed Today     Reviewed by [name], RN (Registered Nurse) on 03/20/21 at [phone]  Med List Status: <None>   Medication Order Taking? Sig Documenting Provider Last Dose Status Informant  baclofen (LIORESAL) 20 MG tablet [id number] No Take 1 tablet (20 mg total) by mouth 3 (three) times daily as needed for muscle spasms.  Patient taking differently: Take 20 mg by mouth 3 (three) times daily.   [name], FNP 01/19/2021 Unknown time Active   benztropine (COGENTIN) 1 MG tablet [id number] No Take 1 tablet (1 mg total) by mouth daily as needed for tremors. [name], MD Unknown PRN Active Self  cephALEXin (KEFLEX) 500  MG capsule [id number]  Take 1 capsule (500 mg total) by mouth 3 (three) times daily. Paulette Blanch, MD  Active   Dulaglutide (TRULICITY) 3.34 DH/6.8SH [facility] [facility] [id number] No Inject 0.75 mg into the skin once a week.  Patient taking differently: Inject 0.75 mg into the skin every Wednesday.   Myles Gip, DO 01/19/2021 Unknown time Active   DULoxetine (CYMBALTA) 30 MG capsule [id number]  Take 1 capsule (30 mg total) by mouth 2 (two) times daily. [name], MD  Active   ezetimibe (ZETIA) 10 MG tablet [id number]  Take 10 mg by mouth daily. [provider]  Active   glucose blood (COOL BLOOD GLUCOSE TEST STRIPS) test strip [id number]  Use as instructed Steele Sizer, MD  Active   Melatonin 10 MG TABS [id number] No Take 10 mg by mouth at bedtime as needed (sleep). [provider] Unknown PRN Active Self  metFORMIN (GLUCOPHAGE) 1000 MG tablet [id number] No Take 1 tablet (1,000 mg total) by mouth 2 (two) times daily with a meal. Myles Gip, DO 01/19/2021 Unknown time Active Self  methocarbamol (ROBAXIN) 500 MG tablet [id number]  methocarbamol 500 mg tablet  Take 1 tablet twice a day by oral route as needed for 7 days. [provider]  Active   mirabegron ER (MYRBETRIQ) 50 MG TB24 tablet [id number] No Take 1 tablet (50 mg total) by mouth daily. Debroah Loop, PA-C 01/19/2021 Unknown time Active Self  Multiple Vitamin (MULTI-VITAMIN) tablet [id number] No Take 1 tablet by mouth daily. [provider] 01/19/2021 Unknown time Active Self  ondansetron (ZOFRAN ODT) 4 MG disintegrating tablet [id number]  Take 1 tablet (4 mg total) by mouth  every 8 (eight) hours as needed for nausea or vomiting. Paulette Blanch, MD  Active   oxyCODONE ER 13.5 MG C12A [id number] No Take 13.5 mg by mouth 2 (two) times daily. [provider] 01/19/2021 Unknown time Active Self  oxyCODONE-acetaminophen (PERCOCET/ROXICET) 5-325 MG tablet [id number]  Take 1 tablet by mouth every 6 (six)  hours as needed for severe pain. Paulette Blanch, MD  Active   phenazopyridine (PYRIDIUM) 200 MG tablet [id number]  Take 1 tablet (200 mg total) by mouth 3 (three) times daily as needed for pain. Paulette Blanch, MD  Active   pregabalin (LYRICA) 200 MG capsule [id number]  Take 200 mg by mouth 2 (two) times daily. [provider]  Active   promethazine (PHENERGAN) 25 MG tablet [id number] No Take 1 tablet (25 mg total) by mouth as needed.  Patient not taking: No sig reported   Myles Gip, DO Not Taking Unknown time Active Self  QUEtiapine (SEROQUEL XR) 300 MG 24 hr tablet [id number]  Take 1 tablet (300 mg total) by mouth at bedtime. [name], MD  Active   QUEtiapine (SEROQUEL XR) 50 MG TB24 24 hr tablet [id number]  Take 1 tablet (50 mg total) by mouth at bedtime. Take along with 300 mg at bedtime [name], MD  Active   sulfamethoxazole-trimethoprim (BACTRIM DS) 800-160 MG per tablet 1 tablet [id number]   Hollice Espy, MD  Active   tacrolimus (PROGRAF) 1 MG capsule [id number] No Take 1 capsule by mouth once daily  Patient taking differently: Take 1 mg by mouth 2 (two) times daily.   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   Edema of lower extremity 09/16/2019   Carpal tunnel syndrome, left 07/26/2019   Cubital tunnel syndrome on left 07/26/2019   Bipolar I disorder, most recent episode depressed ([place]) 07/12/2019   PTSD (post-traumatic stress disorder)  85/11/7739   Umbilical hernia without obstruction and without gangrene 06/17/2019   Encounter for surveillance of abnormal nevi 06/17/2019   Pineal gland cyst 06/17/2019   Chronic hip pain, left 06/02/2019   Lumbar spondylosis 06/02/2019   Mouth dryness 06/02/2019   Obesity (BMI 35.0-39.9 without comorbidity) 06/02/2019   Goals of care, counseling/discussion 05/18/2019   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[place]) 05/18/2019   Occipital neuralgia 05/13/2019   Plantar fasciitis of left foot 04/27/2019   Fibromyalgia 03/23/2019   Systemic lupus erythematosus (SLE) in adult [facility]) 03/23/2019   Essential hypertension 03/23/2019   Hepatitis 03/23/2019   Mass of left kidney 03/23/2019   Diabetes mellitus ([place]) 03/23/2019   Nausea without vomiting 03/23/2019   Neuropathy 03/23/2019   Cancer ([place]) 03/23/2019   Hyperlipidemia 03/23/2019   Osteoarthritis 03/23/2019   Trigeminal neuralgia 03/23/2019   Chronic renal disease, stage III ([place]) 03/23/2019   Nephrolithiasis 03/23/2019   Migraine 03/23/2019  OSA on CPAP 03/23/2019   Fibrocystic breast 03/23/2019   Heart murmur 03/23/2019   Bipolar disorder, in partial remission, most recent episode depressed ([place] [address]) 07/02/2017   Abnormal uterine bleeding 03/18/2017   Major depressive disorder, recurrent ([place]) 03/18/2017   Morbid obesity ([place]) 03/18/2017   Secondary hyperparathyroidism ([place]) 02/07/2017   Neuropathic pain 11/19/2016   Post herpetic neuralgia 11/19/2016   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  Care Plan : Chronic Pain (Adult)  Updates made by [name], RN since 03/20/2021 12:00 AM     Problem: Chronic Pain Management-fibromyalgia   Priority: High  Onset Date: 01/22/2021     Long-Range Goal: Fibromyalgia pain managed-new pain management provider   Start Date: 08/14/2020  Expected End Date: 05/24/2021  Recent Progress: On track  Priority: High  Note:   Current Barriers:  Care Coordination needs related to pain management provider.   Patient is experiencing fibromyalgia flair and is interested in help being connected to a pain management provider. Unable to independently secure referral or appointment for pain management provider. Update 11/24/20:  Patient is being followed by Dr. Humphrey Rolls.  Nurse Case Manager Clinical Goal(s):  Over the next 45 days, patient will work with [facility] to address needs related to referral for pain management provider and associated care coordination needs. Update 09/22/20:  patient is currently seeing Dr. Humphrey Rolls at [facility].  Interventions:  Inter-disciplinary care team collaboration (see longitudinal plan of care) Evaluation of current treatment plan related to fibromyalgia  and patient's adherence to plan as established by provider. Update 02/21/21:  Patient has restarted Cymbalta. Collaborated with primary care provider regarding recommendations and referral to pain management provider. Discussed plans with patient for ongoing care management follow up and provided patient with direct contact information for care management team Anticipate pain education program, pain management support as part of pain management referral.      [name] contacted patient to check in. Patient stated she has not contacted an eye doctor nor has she seen a specialist. Patient states she thinks she has COVID due to her husband having it. She has not taken a test yet, but has some symptoms. She is isolating in the  bedroom and they have someone to cook and bring her food.       Update 11/16/20: [name] completed phone call with patient, she states she is still having symptoms from [place] and has been in bed all month. Patient states she has not had a chance to contact an eye doctor or specialist due to being sick. Update 11/24/20:  Patient with continued fatigue-will follow up with PCP. Update 01/22/21:  patient continues to follow up with provider. Pharmacy referral for medication review. Update 01/22/21:  Patient met with Pharmacist-continuing to follow. Update 12/14/20: [name] completed call with patient, she stated she is waiting on a call from [facility] about getting a liver biopsy completed. She has not reached out to the eye doctor yet because her liver is top priority right now. Update 12/22/20:  Patient seen by PCP 12/11/20.  Has not seen Opthamologist yet.  Had liver biopsy 12/20/20 due to increased  liver enzymes.  Update 01/15/21: [name] completed a follow up with patient. She stated she found out she has to have a liver transplant and is currently completing testing. Patient stated that she has been in pain since her pcp took her off of her pain medication. Patient stated her rent will be going up by $400 and they have to move. They will most likely move to [place] area because her fiance' works in that area. No resources needed at this time. Update 03/20/21:  Pain states no pain at this time.  Has decided to stay in current apartment.  Patient states her liver function tests have improved and she is stable right now.  Patient Goals/Self-Care Activities Over the next 45 days, patient will:  -Attends all scheduled provider appointments  develop a personal pain management plan with your pain management provider.  Follow Up Plan:  The Managed Medicaid care management team will reach out to the patient again over the next 30 days.     Care Plan : Wellness (Adult)  Updates made by [name], RN since 03/20/2021  12:00 AM     Problem: Medication Adherence (Wellness)   Priority: High  Onset Date: 01/22/2021     Goal: Medication Adherence Maintained   Start Date: 09/06/2020  Expected End Date: 05/24/2021  Recent Progress: Not on track  Priority: High  Note:   Current Barriers:  Patient states she needs to have prescriptions refilled and feels she has some side effects from her medications. Update 12/22/20:  Patient's PCP discontinued Victoza and added Trulicity.  Patient has not been taking mediations as prescribed.  Hgb A1C 8.1 on 12/11/20. Update 01/22/21:  Patient states she is taking medications.  She checks blood sugar once a day and needs more testing strips.  Blood sugars 200's per patient. Update 02/21/21:  Patient is checking blood sugars 4 times a day.  Has appointment with endocrinologist today and is keeping food diary. Update 03/20/21:  Blood sugars mid 100's perpatient-has appointment with nutritionist 03/28/21.  Nurse Case Manager Clinical Goal(s):  Over the next 30 days, patient will work with CM team pharmacist to review current medications. Update 01/22/21:  Patient met with Pharmacist and continues to follow.  Interventions:  Inter-disciplinary care team collaboration (see longitudinal plan of care) Evaluation of current treatment plan and patient's adherence to plan as established by provider. Advised patient to contact her PCP for any medication needs. Reviewed medications with patient. Collaborated with pharmacy regarding medications.  Discussed plans with patient for ongoing care management follow up and provided patient with direct contact information for care management team Pharmacy referral for medication review. Will notify PCP of need for testing strips and dietician referral. Update 02/21/21:  Patient has appointment with nutritionist 03/28/21 and has all testing supplies needed.  Patient Goals/Self-Care Activities Over the next 30 days, patient will:  -Patient will take  medications as prescribed. Calls pharmacy for medication refills Calls provider office for new concerns or questions  Follow Up Plan: The Managed Medicaid care management team will reach out to the patient again over the next 30 days.  The patient has been provided with contact information for the Managed Medicaid care management team and has been advised to call with any health related questions or concerns.     Follow Up:  Patient agrees to Care Plan and Follow-up.  Plan: The Managed Medicaid care management team will reach out to the patient again over the next 30 days. and The patient has been  provided with contact information for the Managed Medicaid care management team and has been advised to call with any health related questions or concerns.  Date/time of next scheduled RN care management/care coordination outreach: 04/20/21 at 0900.

## 2021-03-23 ENCOUNTER — Inpatient Hospital Stay: Payer: Medicaid Other | Attending: Oncology

## 2021-03-23 ENCOUNTER — Other Ambulatory Visit: Payer: Self-pay

## 2021-03-23 DIAGNOSIS — Z8572 Personal history of non-Hodgkin lymphomas: Secondary | ICD-10-CM | POA: Insufficient documentation

## 2021-03-23 DIAGNOSIS — Z08 Encounter for follow-up examination after completed treatment for malignant neoplasm: Secondary | ICD-10-CM

## 2021-03-23 DIAGNOSIS — E1122 Type 2 diabetes mellitus with diabetic chronic kidney disease: Secondary | ICD-10-CM | POA: Diagnosis not present

## 2021-03-23 DIAGNOSIS — Z7984 Long term (current) use of oral hypoglycemic drugs: Secondary | ICD-10-CM | POA: Insufficient documentation

## 2021-03-23 DIAGNOSIS — Z79899 Other long term (current) drug therapy: Secondary | ICD-10-CM | POA: Diagnosis not present

## 2021-03-23 DIAGNOSIS — I129 Hypertensive chronic kidney disease with stage 1 through stage 4 chronic kidney disease, or unspecified chronic kidney disease: Secondary | ICD-10-CM | POA: Diagnosis not present

## 2021-03-23 DIAGNOSIS — N183 Chronic kidney disease, stage 3 unspecified: Secondary | ICD-10-CM | POA: Diagnosis not present

## 2021-03-23 DIAGNOSIS — Z9221 Personal history of antineoplastic chemotherapy: Secondary | ICD-10-CM | POA: Insufficient documentation

## 2021-03-23 LAB — CBC WITH DIFFERENTIAL/PLATELET
Abs Immature Granulocytes: 0.04 10*3/uL (ref 0.00–0.07)
Basophils Absolute: 0.1 10*3/uL (ref 0.0–0.1)
Basophils Relative: 1 %
Eosinophils Absolute: 0.4 10*3/uL (ref 0.0–0.5)
Eosinophils Relative: 5 %
HCT: 43 % (ref 36.0–46.0)
Hemoglobin: 14.3 g/dL (ref 12.0–15.0)
Immature Granulocytes: 1 %
Lymphocytes Relative: 29 %
Lymphs Abs: 2 10*3/uL (ref 0.7–4.0)
MCH: 29.5 pg (ref 26.0–34.0)
MCHC: 33.3 g/dL (ref 30.0–36.0)
MCV: 88.7 fL (ref 80.0–100.0)
Monocytes Absolute: 0.6 10*3/uL (ref 0.1–1.0)
Monocytes Relative: 9 %
Neutro Abs: 3.8 10*3/uL (ref 1.7–7.7)
Neutrophils Relative %: 55 %
Platelets: 123 10*3/uL — ABNORMAL LOW (ref 150–400)
RBC: 4.85 MIL/uL (ref 3.87–5.11)
RDW: 13.1 % (ref 11.5–15.5)
WBC: 6.9 10*3/uL (ref 4.0–10.5)
nRBC: 0 % (ref 0.0–0.2)

## 2021-03-23 LAB — COMPREHENSIVE METABOLIC PANEL
ALT: 64 U/L — ABNORMAL HIGH (ref 0–44)
AST: 61 U/L — ABNORMAL HIGH (ref 15–41)
Albumin: 4.1 g/dL (ref 3.5–5.0)
Alkaline Phosphatase: 71 U/L (ref 38–126)
Anion gap: 9 (ref 5–15)
BUN: 22 mg/dL — ABNORMAL HIGH (ref 6–20)
CO2: 26 mmol/L (ref 22–32)
Calcium: 9.7 mg/dL (ref 8.9–10.3)
Chloride: 101 mmol/L (ref 98–111)
Creatinine, Ser: 1.09 mg/dL — ABNORMAL HIGH (ref 0.44–1.00)
GFR, Estimated: >60 mL/min (ref >60)
Glucose, Bld: 171 mg/dL — ABNORMAL HIGH (ref 70–99)
Potassium: 5.4 mmol/L — ABNORMAL HIGH (ref 3.5–5.1)
Sodium: 136 mmol/L (ref 135–145)
Total Bilirubin: 1 mg/dL (ref 0.3–1.2)
Total Protein: 7.8 g/dL (ref 6.5–8.1)

## 2021-03-28 ENCOUNTER — Encounter: Payer: Self-pay | Admitting: Dietician

## 2021-03-28 ENCOUNTER — Other Ambulatory Visit: Payer: Self-pay

## 2021-03-28 ENCOUNTER — Encounter: Payer: Medicaid Other | Attending: Family Medicine | Admitting: Dietician

## 2021-03-28 VITALS — Ht 66.0 in | Wt 258.5 lb

## 2021-03-28 DIAGNOSIS — E1165 Type 2 diabetes mellitus with hyperglycemia: Secondary | ICD-10-CM | POA: Insufficient documentation

## 2021-03-28 DIAGNOSIS — E119 Type 2 diabetes mellitus without complications: Secondary | ICD-10-CM

## 2021-03-28 NOTE — Progress Notes (Signed)
Medical Nutrition Therapy: Visit start time: 1975  end time: 1445  Assessment:  Diagnosis: Type 2 diabetes, obesity Past medical history: lupus, fibromyalgia, liver disease hx of liver transplant; HTN, gout, sleep apnea Psychosocial issues/ stress concerns: bipolar disorder  Preferred learning method:  Visual Hands-on   Current weight: 258.5lbs  Height: 5'6" BMI: 41.72 Medications, supplements: reconciled list in medical record  Progress and evaluation:  Patient reports hunger and appetite fluctuate according to pain level, sometimes skips meals or eats very little one day; other days eats frequently and larger amounts. Follows vegetarian diet, some [place] foods; substitutes meat alternatives inclucidng tofu for the meat in recipes Checks BGs fasting ranging 125-250; other test times vary, ranging 180-200  Physical activity: limited ability; plans to resume some water exercise in pool  Dietary Intake:  Usual eating pattern includes 1-3 meals and 0-3 snacks per day. Dining out frequency: 1-3 meals per week.  Breakfast: gets up anywhere from 10am to 2pm; glucerna shake, toast; yogurt; avoids milk due to cancer history Snack: none or Lunch: usually skips low sugar pudding Snack: none or same as am/ night Supper: 3-5pm -- 03/27/21 BLT sandwiches (2) with veg bacon alternative; meat substitutes; oatmeal;  Snack: midnight -- sandwich with ww bread made strawberry cheesecake with fat free sugar free pudding, stevia Beverages: sugar free tea, soda, koolaid; does not like tap water  Nutrition Care Education: Topics covered:  Basic nutrition: basic food groups, appropriate nutrient balance, appropriate meal and snack schedule, general nutrition guidelines    Weight control: importance of low sugar and low fat choices, estimated energy needs for weight loss at 1300kcal, provided guidance for 45-50% CHO, 20-25% protein Diabetes:  appropriate meal and snack schedule, appropriate carb intake and  balance, healthy carb choices, role of fiber, protein; goal of avoiding wide fluctuations in BGs and strategies to manage diet when not feeling well (sick day guidelines) Other:  options requiring little or no preparation or cooking due to limited ability; vegetarian protein options;   Nutritional Diagnosis:  [place]-2.2 Altered nutrition-related laboratory As related to [place] 2 diabetes.  As evidenced by patient with elevated home and lab blood glucose results. [place]-3.3 Overweight/obesity As related to history of excess calories, inability to incorporate much activity, and ascites.  As evidenced by patient with current BMI of 41.7.  Intervention:  Instruction and discussion as noted above. Patient has been working on diet and lifestyle changes and has lost about 25lbs thus far. She is motivated to continue.  Established goals for additional improvement with direction from patient.    Education Materials given:  Museum/gallery conservator with food lists, sample meal patterns Sample menus Snacking handout Sick Day Guidelines Visit summary with goals/ instructions   Learner/ who was taught:  Patient   Level of understanding: Verbalizes/ demonstrates competency  Demonstrated degree of understanding via:   Teach back Learning barriers: None  Willingness to learn/ readiness for change: Eager, change in progress  Monitoring and Evaluation:  Dietary intake, exercise, BG control, and body weight      follow up:  05/10/21 at 1:30pm

## 2021-03-28 NOTE — Patient Instructions (Signed)
Plan to eat a meal or snack every 3-5 hours during the day.  Keep snacks that have some carb and some protein close by -- crackers and peanut butter, bread and cheese, fruit and egg substitute, yogurt are all healthy options.  When not feeling well, continue to eat a small amount of food every 2-3 hours, sip on fluids even sugar sweetened if needed to prevent low blood sugar.

## 2021-03-30 ENCOUNTER — Encounter: Payer: Self-pay | Admitting: Oncology

## 2021-04-02 NOTE — Telephone Encounter (Signed)
Can you see her? Could consider ct soft tissue neck

## 2021-04-03 DIAGNOSIS — E1165 Type 2 diabetes mellitus with hyperglycemia: Secondary | ICD-10-CM | POA: Diagnosis not present

## 2021-04-03 DIAGNOSIS — E7849 Other hyperlipidemia: Secondary | ICD-10-CM | POA: Diagnosis not present

## 2021-04-03 DIAGNOSIS — Z944 Liver transplant status: Secondary | ICD-10-CM | POA: Diagnosis not present

## 2021-04-04 ENCOUNTER — Other Ambulatory Visit: Payer: Self-pay

## 2021-04-04 NOTE — Patient Outreach (Signed)
Due for meds on 04/12/21  Metformin   1000 mg  Mirabegron   Er 50 mg  Paroxetine   10 mg  Quetiapine   Er 300 mg  Tacrolimus   1 mg  Myrbetriq   50 mg  Duloxetine   30 mg  Quetiapine   Er 50 mg  Ezetimibe    10 mg  Lisinopril    20 mg  Ondansetron

## 2021-04-05 ENCOUNTER — Other Ambulatory Visit: Payer: Self-pay

## 2021-04-05 ENCOUNTER — Inpatient Hospital Stay (HOSPITAL_BASED_OUTPATIENT_CLINIC_OR_DEPARTMENT_OTHER): Payer: Medicaid Other | Admitting: Nurse Practitioner

## 2021-04-05 VITALS — BP 143/90 | HR 90 | Temp 99.0°F | Resp 20 | Wt 254.3 lb

## 2021-04-05 DIAGNOSIS — I129 Hypertensive chronic kidney disease with stage 1 through stage 4 chronic kidney disease, or unspecified chronic kidney disease: Secondary | ICD-10-CM | POA: Diagnosis not present

## 2021-04-05 DIAGNOSIS — C884 Extranodal marginal zone B-cell lymphoma of mucosa-associated lymphoid tissue [MALT-lymphoma]: Secondary | ICD-10-CM

## 2021-04-05 DIAGNOSIS — E1122 Type 2 diabetes mellitus with diabetic chronic kidney disease: Secondary | ICD-10-CM | POA: Diagnosis not present

## 2021-04-05 DIAGNOSIS — R221 Localized swelling, mass and lump, neck: Secondary | ICD-10-CM

## 2021-04-05 DIAGNOSIS — N183 Chronic kidney disease, stage 3 unspecified: Secondary | ICD-10-CM | POA: Diagnosis not present

## 2021-04-05 DIAGNOSIS — Z7984 Long term (current) use of oral hypoglycemic drugs: Secondary | ICD-10-CM | POA: Diagnosis not present

## 2021-04-05 DIAGNOSIS — Z8572 Personal history of non-Hodgkin lymphomas: Secondary | ICD-10-CM | POA: Diagnosis not present

## 2021-04-05 DIAGNOSIS — Z79899 Other long term (current) drug therapy: Secondary | ICD-10-CM | POA: Diagnosis not present

## 2021-04-05 DIAGNOSIS — Z9221 Personal history of antineoplastic chemotherapy: Secondary | ICD-10-CM | POA: Diagnosis not present

## 2021-04-05 NOTE — Progress Notes (Signed)
 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  She is currently on CellCept and tacrolimus for the same.  She also has a history of post transplant lymphoproliferative disorder/DLBCL that was diagnosed in 2016.  She is s/p 6 cycles of R-CHOP chemotherapy back then and was in complete remission 1.  Her other past medical history significant for migraines, stage III chronic kidney disease, chemo-induced peripheral neuropathy, hypertension among other medical problems.  With regards to her diffuse large B-cell lymphoma she was getting surveillance scans and this was all done in [place].  She has now moved to [place] to be with her significant other.  Patient was noted to have a prior kidney mass before which was biopsied and was not consistent with malignancy.   She underwent CT neck with contrast before she left [place] on 02/22/2019.  She was noted to have soft tissue masses in both the parotid glands which were new as compared to prior exams and possibly represent lymph nodes.  The largest one was seen in the left parotid gland measuring 1.3 x 1.1 cm.  Before she could get a work-up for this patient moved to [place] and has  not had any further work-up yet   Her other prior imaging is as follows: MRI thoracic spine without contrast in August 2019 showed moderate multilevel spondylosis but no evidence of lymphoma.  Multinodular thyroid in June 2019.  MRI cervical spine May 2019 again showed spondylosis but no other acute pathology.  MRI brain showed incidental partial opacification of the right and right sinus.  No evidence of malignancy.  I do not have any other PET CT scan or treatment records from the past.   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  Bone marrow biopsy was negative for lymphoma     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HCC)  05/18/2019 Initial Diagnosis   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HCC)    05/28/2019 -  Chemotherapy   The patient had riTUXimab-pvvr (RUXIENCE) 900 mg in sodium chloride 0.9 % 250 mL chemo infusion, , Intravenous, Once, 4 of 4 cycles Administration:  (05/28/2019),  (06/11/2019),  (06/18/2019)   for chemotherapy treatment.       Interval history- Patient is 45 year old female who [facility] for complaints of left neck swelling which started approximately a week ago. Swelling radiates to left jaw and ear. Same site as where she previously had lymphoma. Area is tender but not overtly painful. No redness or drainage. No recent injury or wounds. She is concerned about recurrent lymphoma. No fevers or chills. No recent illness or hospitalizaiton. Appetite is stable and she denies weight loss. No night sweats. No easy bleeding or bruising. No chest pain or shortness of breath. No GI symptoms.   No urinary symptoms. No other specific complaints today.   Review of systems- Review of Systems  Constitutional:  Negative for chills, fever, malaise/fatigue and weight loss.  HENT:  Positive for ear pain. Negative for congestion, ear discharge, hearing loss, nosebleeds, sinus pain, sore throat and tinnitus.   Eyes:  Negative for  blurred vision and double vision.  Respiratory:  Negative for cough, hemoptysis, shortness of breath, wheezing and stridor.   Cardiovascular:  Negative for chest pain, palpitations and leg swelling.  Gastrointestinal:  Negative for abdominal pain, blood in stool, constipation, diarrhea, melena, nausea and vomiting.  Genitourinary:  Negative for dysuria and urgency.  Musculoskeletal:  Negative for back pain, falls, joint pain and myalgias.  Skin:  Negative for itching and rash.  Neurological:  Negative for dizziness, tingling, sensory change, loss of consciousness, weakness and headaches.  Endo/Heme/Allergies:  Negative for environmental allergies. Does not bruise/bleed easily.  Psychiatric/Behavioral:  Negative for depression. The patient is not nervous/anxious and does not have insomnia.     Current treatment- surveillance  Allergies  Allergen Reactions   Penicillin G Itching, Rash, Anaphylaxis and Palpitations   Lamictal [Lamotrigine] Rash   Carbamazepine Other (See Comments)    Medication interaction-prograf   Hydrocodone-Acetaminophen Itching   Naproxen Itching    Past Medical History:  Diagnosis Date   Abnormal uterine bleeding    Allergy    Anxiety    Arthritis    Bipolar disorder (manic depression) (HCC)    Chronic kidney failure    Chronic renal disease, stage III (HCC)    Diabetes mellitus without complication (HCC)    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HCC) 2015   Right axillary lymph node resected and chemo tx's.   FH: trigeminal neuralgia    GERD (gastroesophageal reflux disease)    Heart murmur    Hypertension    Kidney mass    Lupus (HCC)    Lupus (HCC)    Major depressive disorder    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HCC) 06/2019   Chemo tx's   Migraine    Morbid obesity (HCC)    Neuromuscular disorder (HCC)    neuropathy   Neuropathy    Personality disorder (HCC)    Post herpetic neuralgia    PTSD (post-traumatic stress disorder)    Renal disorder     S/P liver transplant (HCC)     Past Surgical History:  Procedure Laterality Date   BONE MARROW BIOPSY  01/13/2015   BREAST BIOPSY  12/2014   BREAST BIOPSY  2011   BREAST SURGERY     CHOLECYSTECTOMY     COLONOSCOPY     ESOPHAGOGASTRODUODENOSCOPY     ESOPHAGOGASTRODUODENOSCOPY (EGD) WITH PROPOFOL N/A 01/09/2021   Procedure: ESOPHAGOGASTRODUODENOSCOPY (EGD) WITH PROPOFOL;  Surgeon: Wohl, Darren, MD;  Location: [facility] ENDOSCOPY;  Service: Endoscopy;  Laterality: N/A;   HERNIA REPAIR     infusaport     LIVER TRANSPLANT  12/17/1991   LUMBAR PUNCTURE     PORT A CATH INJECTION ([facility] HX)     tumor removal  2015    Social History   Socioeconomic History   Marital status: Single    Spouse name: Not on file   Number of children: 0   Years of education: 9   Highest education level: GED or equivalent  Occupational History   Occupation: disabled  Tobacco Use   Smoking status: Never   Smokeless tobacco: Never  Vaping Use   Vaping Use: Never used  Substance and Sexual Activity     Alcohol use: Not Currently   Drug use: Not Currently    Types: Marijuana    Comment: pain managment last used in early april   Sexual activity: Not Currently  Other Topics Concern   Not on file  Social History Narrative   Not on file   Social Determinants of Health   Financial Resource Strain: Not on file  Food Insecurity: Not on file  Transportation Needs: Not on file  Physical Activity: Not on file  Stress: Not on file  Social Connections: Not on file  Intimate Partner Violence: Not on file    Family History  Problem Relation Age of Onset   Heart disease Mother    Hypertension Mother    Cancer - Other Mother    Bipolar disorder Mother    Heart disease Father    Hypertension Father    Diabetes Father    Parkinson's disease Maternal Grandmother    Cancer Maternal Aunt    Cancer Maternal Uncle    Cancer Maternal Grandfather    Lupus Paternal Grandmother    Hypertension Brother       Current Outpatient Medications:    baclofen (LIORESAL) 20 MG tablet, Take 1 tablet (20 mg total) by mouth 3 (three) times daily as needed for muscle spasms., Disp: 90 each, Rfl: 2   benztropine (COGENTIN) 1 MG tablet, Take 1 tablet (1 mg total) by mouth daily as needed for tremors., Disp: 30 tablet, Rfl: 1   Dulaglutide (TRULICITY) 0.75 MG/0.5ML SOPN, Inject 0.75 mg into the skin once a week. (Patient taking differently: Inject 0.75 mg into the skin every Wednesday.), Disp: 3 mL, Rfl: 3   DULoxetine (CYMBALTA) 30 MG capsule, Take 1 capsule (30 mg total) by mouth 2 (two) times daily., Disp: 180 capsule, Rfl: 0   glucose blood (COOL BLOOD GLUCOSE TEST STRIPS) test strip, Use as instructed (Patient not taking: Reported on 04/10/2021), Disp: 100 each, Rfl: 12   hydrOXYzine (VISTARIL) 25 MG capsule, Take by mouth., Disp: , Rfl:    Melatonin 10 MG TABS, Take 10 mg by mouth at bedtime as needed (sleep)., Disp: , Rfl:    metFORMIN (GLUCOPHAGE) 1000 MG tablet, Take 1 tablet (1,000 mg total) by mouth 2 (two) times daily with a meal., Disp: 180 tablet, Rfl: 3   mirabegron ER (MYRBETRIQ) 50 MG TB24 tablet, Take 1 tablet (50 mg total) by mouth daily., Disp: 90 tablet, Rfl: 3   Multiple Vitamin (MULTI-VITAMIN) tablet, Take 1 tablet by mouth daily., Disp: , Rfl:    ondansetron (ZOFRAN ODT) 4 MG disintegrating tablet, Take 1 tablet (4 mg total) by mouth every 8 (eight) hours as needed for nausea or vomiting., Disp: 20 tablet, Rfl: 0   oxyCODONE ER (XTAMPZA ER) 13.5 MG C12A, Take 1 tablet by mouth 2 (two) times daily., Disp: , Rfl:    pregabalin (LYRICA) 200 MG capsule, Take 200 mg by mouth 2 (two) times daily., Disp: , Rfl:    QUEtiapine (SEROQUEL XR) 300 MG 24 hr tablet, Take 1 tablet (300 mg total) by mouth at bedtime., Disp: 90 tablet, Rfl: 0   QUEtiapine (SEROQUEL XR) 50 MG TB24 24 hr tablet, Take 1 tablet (50 mg total) by mouth at bedtime. Take along with 300 mg at bedtime, Disp: 90 tablet, Rfl: 0    tacrolimus (PROGRAF) 1 MG capsule, Take 1 capsule by mouth once daily (Patient taking differently: Take 1 mg by mouth 2 (two) times daily.), Disp: 30 capsule, Rfl: 0   ezetimibe (ZETIA) 10   MG tablet, Take 10 mg by mouth daily., Disp: , Rfl:    icosapent Ethyl (VASCEPA) 1 g capsule, Take by mouth., Disp: , Rfl:    lisinopril (ZESTRIL) 20 MG tablet, Take 20 mg by mouth daily., Disp: , Rfl:    Magnesium 500 MG CAPS, Take by mouth., Disp: , Rfl:    PARoxetine (PAXIL) 10 MG tablet, Take 10 mg by mouth daily., Disp: , Rfl:    promethazine (PHENERGAN) 25 MG tablet, Take 1 tablet (25 mg total) by mouth as needed. (Patient not taking: No sig reported), Disp: 15 tablet, Rfl: 0   Pyridoxine HCl (B-6 PO), Take by mouth., Disp: , Rfl:    VITAMIN D, CHOLECALCIFEROL, PO, Take by mouth., Disp: , Rfl:   Current Facility-Administered Medications:    sulfamethoxazole-trimethoprim (BACTRIM DS) 800-160 MG per tablet 1 tablet, 1 tablet, Oral, Q12H, Brandon, Ashley, MD, 1 tablet at 01/17/21 1432  Physical exam:  Vitals:   04/05/21 1400  BP: (!) 143/90  Pulse: 90  Resp: 20  Temp: 99 F (37.2 C)  TempSrc: Tympanic  SpO2: 95%  Weight: 254 lb 4.8 oz (115.3 kg)   Physical Exam Constitutional:      Appearance: She is not ill-appearing.  Neck:   Cardiovascular:     Rate and Rhythm: Normal rate and regular rhythm.  Pulmonary:     Effort: Pulmonary effort is normal. No respiratory distress.  Abdominal:     General: There is no distension.     Tenderness: There is no abdominal tenderness.  Skin:    General: Skin is warm and dry.     Coloration: Skin is not pale.  Neurological:     Mental Status: She is alert and oriented to person, place, and time.  Psychiatric:        Mood and Affect: Mood normal.        Behavior: Behavior normal.     CMP Latest Ref Rng & Units 04/10/2021  Glucose 70 - 99 mg/dL 172(H)  BUN 6 - 20 mg/dL 19  Creatinine 0.44 - 1.00 mg/dL 1.06(H)  Sodium 135 - 145 mmol/L 134(L)   Potassium 3.5 - 5.1 mmol/L 4.4  Chloride 98 - 111 mmol/L 98  CO2 22 - 32 mmol/L 25  Calcium 8.9 - 10.3 mg/dL 9.3  Total Protein 6.5 - 8.1 g/dL 7.3  Total Bilirubin 0.3 - 1.2 mg/dL 0.8  Alkaline Phos 38 - 126 U/L 68  AST 15 - 41 U/L 65(H)  ALT 0 - 44 U/L 61(H)   CBC Latest Ref Rng & Units 04/10/2021  WBC 4.0 - 10.5 K/uL 4.6  Hemoglobin 12.0 - 15.0 g/dL 13.9  Hematocrit 36.0 - 46.0 % 42.3  Platelets 150 - 400 K/uL 125(L)    No images are attached to the encounter.  US Soft Tissue Head/Neck  Result Date: 04/06/2021 CLINICAL DATA:  Provided history: Extranodal marginal zone B-cell lymphoma of mucosa associated lymphoid tissue (MALT). Localized swelling, mass and lump, neck. Left neck swelling with history of lymphoma of left parotid. EXAM: ULTRASOUND OF HEAD/NECK SOFT TISSUES TECHNIQUE: Ultrasound examination of the head and neck soft tissues was performed in the area of clinical concern. COMPARISON:  Prior PET-CT examinations 07/08/2019 and earlier. Ultrasound of the bilateral parotid glands statin 05/27/2019. FINDINGS: Targeted ultrasound was performed in the left parotid and left submandibular gland regions of concern. Limited imaging of the contralateral right parotid and right submandibular glands was also acquired for the purposes of comparison. Apparent nodular lesion at the inferior aspect   of the left parotid gland measuring 1.3 x 0.8 x 1.2 cm, with heterogeneous echotexture and internal color Doppler flow. No second lesion is appreciated. These results will be called to the ordering clinician or representative by the Radiologist Assistant, and communication documented in the PACS or [facility] Dashboard. IMPRESSION: Apparent 1.3 cm nodular lesion at the inferior aspect of the left parotid gland with internal color Doppler flow. Given the patient's history, this finding is suspicious for an abnormal lymph node or small mass. A contrast-enhanced neck CT should be considered for further evaluation.  Electronically Signed   By: Kyle  Golden DO   On: 04/06/2021 14:55    Assessment and plan- Patient is a 45 y.o. 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 She presents with new left neck swelling and night sweats. Will get ultrasound of left neck and have her follow up with Dr. Rao for results. Question inflammatory, viral illness, vs recurrent disease. No obvious infection so will hold off on antibiotics. Patient has previously seen Dr. Vaught at [facility] if she needs repeat biopsy but will hold off at this time.   Patient had Spep and IFE checked by neurology for numbness and tingling which revealed an elevated M spike. Etiology unclear; question mgus.   Ultrasound left neck stat. Follow up with Dr. Rao for labs (recheck spep and ife) to evaluate for possible mgus and follow up.    Visit Diagnosis 1. 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)   2. Localized swelling, mass and lump, neck     Patient expressed understanding and was in agreement with this plan. She also understands that She can call clinic at any time with any questions, concerns, or complaints.   Thank you for allowing me to participate in the care of this very pleasant patient.    , DNP, AGNP-[facility] [phone]  

## 2021-04-05 NOTE — Progress Notes (Signed)
Reports swollen area in Left neck  below her ear has gone down tremendously. Tiny bit sore now, almost gone. Pt has blood result report with her from  Dr Manuella Ghazi, transplant doctor.

## 2021-04-06 ENCOUNTER — Ambulatory Visit
Admission: RE | Admit: 2021-04-06 | Discharge: 2021-04-06 | Disposition: A | Discharge status: Home / Self Care | Payer: Medicaid Other | Source: Ambulatory Visit | Attending: Nurse Practitioner | Admitting: Nurse Practitioner

## 2021-04-06 DIAGNOSIS — C884 Extranodal marginal zone B-cell lymphoma of mucosa-associated lymphoid tissue [MALT-lymphoma]: Secondary | ICD-10-CM | POA: Diagnosis not present

## 2021-04-06 DIAGNOSIS — R221 Localized swelling, mass and lump, neck: Secondary | ICD-10-CM | POA: Insufficient documentation

## 2021-04-10 ENCOUNTER — Inpatient Hospital Stay: Payer: Medicaid Other | Attending: Oncology

## 2021-04-10 ENCOUNTER — Inpatient Hospital Stay: Payer: Medicaid Other | Admitting: Oncology

## 2021-04-10 ENCOUNTER — Other Ambulatory Visit: Payer: Self-pay

## 2021-04-10 ENCOUNTER — Telehealth: Payer: Self-pay | Admitting: Oncology

## 2021-04-10 VITALS — BP 127/89 | HR 90 | Temp 98.0°F | Resp 18 | Wt 257.0 lb

## 2021-04-10 DIAGNOSIS — D472 Monoclonal gammopathy: Secondary | ICD-10-CM

## 2021-04-10 DIAGNOSIS — Z8572 Personal history of non-Hodgkin lymphomas: Secondary | ICD-10-CM

## 2021-04-10 DIAGNOSIS — C884 Extranodal marginal zone B-cell lymphoma of mucosa-associated lymphoid tissue [MALT-lymphoma]: Secondary | ICD-10-CM

## 2021-04-10 LAB — CBC WITH DIFFERENTIAL/PLATELET
Abs Immature Granulocytes: 0.01 10*3/uL (ref 0.00–0.07)
Basophils Absolute: 0 10*3/uL (ref 0.0–0.1)
Basophils Relative: 1 %
Eosinophils Absolute: 0.2 10*3/uL (ref 0.0–0.5)
Eosinophils Relative: 5 %
HCT: 42.3 % (ref 36.0–46.0)
Hemoglobin: 13.9 g/dL (ref 12.0–15.0)
Immature Granulocytes: 0 %
Lymphocytes Relative: 37 %
Lymphs Abs: 1.7 10*3/uL (ref 0.7–4.0)
MCH: 30.3 pg (ref 26.0–34.0)
MCHC: 32.9 g/dL (ref 30.0–36.0)
MCV: 92.2 fL (ref 80.0–100.0)
Monocytes Absolute: 0.4 10*3/uL (ref 0.1–1.0)
Monocytes Relative: 9 %
Neutro Abs: 2.2 10*3/uL (ref 1.7–7.7)
Neutrophils Relative %: 48 %
Platelets: 125 10*3/uL — ABNORMAL LOW (ref 150–400)
RBC: 4.59 MIL/uL (ref 3.87–5.11)
RDW: 13.8 % (ref 11.5–15.5)
WBC: 4.6 10*3/uL (ref 4.0–10.5)
nRBC: 0 % (ref 0.0–0.2)

## 2021-04-10 LAB — COMPREHENSIVE METABOLIC PANEL
ALT: 61 U/L — ABNORMAL HIGH (ref 0–44)
AST: 65 U/L — ABNORMAL HIGH (ref 15–41)
Albumin: 4 g/dL (ref 3.5–5.0)
Alkaline Phosphatase: 68 U/L (ref 38–126)
Anion gap: 11 (ref 5–15)
BUN: 19 mg/dL (ref 6–20)
CO2: 25 mmol/L (ref 22–32)
Calcium: 9.3 mg/dL (ref 8.9–10.3)
Chloride: 98 mmol/L (ref 98–111)
Creatinine, Ser: 1.06 mg/dL — ABNORMAL HIGH (ref 0.44–1.00)
GFR, Estimated: >60 mL/min (ref >60)
Glucose, Bld: 172 mg/dL — ABNORMAL HIGH (ref 70–99)
Potassium: 4.4 mmol/L (ref 3.5–5.1)
Sodium: 134 mmol/L — ABNORMAL LOW (ref 135–145)
Total Bilirubin: 0.8 mg/dL (ref 0.3–1.2)
Total Protein: 7.3 g/dL (ref 6.5–8.1)

## 2021-04-10 NOTE — Telephone Encounter (Signed)
Spoke with patient to confirm upcoming appointments for [place] and MD f/u. Patient agreeable.

## 2021-04-10 NOTE — Progress Notes (Signed)
Patient reports brain fog, mild nausea, diarrhea, loosing balance, neuropathy in hands and feet.

## 2021-04-10 NOTE — Progress Notes (Signed)
Hematology/Oncology Consult note [facility]  [phone] [phone]  Patient Care Team: Dare as PCP - General (Family Medicine) Craft, [name], RN as Case Manager Ethelda Chick as Social Worker Lane Hacker, 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  She is currently on CellCept and tacrolimus for the same.  She also has a history of post transplant lymphoproliferative disorder/DLBCL that was diagnosed in 2016.  She is s/p 6 cycles of R-CHOP chemotherapy back then and was in complete remission 1.  Her other past medical history significant for migraines, stage III chronic kidney disease, chemo-induced peripheral neuropathy, hypertension among other medical problems.  With regards to her diffuse large B-cell lymphoma she was getting surveillance scans and this was all done in [place].  She has now moved to [facility] to be with her significant other.  Patient was noted to have a prior kidney mass before which was biopsied and was not consistent with malignancy.   She underwent CT neck with contrast before she left [place] on 02/22/2019.  She was noted to have soft tissue masses in both the parotid glands which were new as compared to prior exams and possibly represent lymph nodes.  The largest one was seen in the left parotid gland measuring 1.3 x 1.1 cm.  Before she could get a work-up for this patient moved to [place] and has not had any  further work-up yet   Her other prior imaging is as follows: MRI thoracic spine without contrast in August 2019 showed moderate multilevel spondylosis but no evidence of lymphoma.  Multinodular thyroid in June 2019.  MRI cervical spine May 2019 again showed spondylosis but no other acute pathology.  MRI brain showed incidental partial opacification of the right and right sinus.  No evidence of malignancy.  I do not have any other PET CT scan or treatment records from the past.   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  Bone marrow biopsy was negative for lymphoma.  Patient completed 4 cycles of weekly RituxanIn September 2020.  Repeat PET CT scan showed interval resolution of the parotid lesions compatible with complete response to therapy  Interval history-patient states that her left neck was swollen for about 2 weeks and is now gradually getting better.  She reports ongoing drenching night sweats.  Weight and appetite has remained stable.  Patient was seen by neurology for symptoms of headache and possible trigeminal neuralgia  ECOG PS- 1 Pain scale- 0   Review of systems- Review of Systems  Constitutional:  Positive for malaise/fatigue. Negative for chills, fever and weight loss.       Night sweats  HENT:  Negative for congestion, ear discharge and nosebleeds.        Neck swelling  Eyes:  Negative for blurred vision.  Respiratory:  Negative for cough, hemoptysis, sputum production, shortness of breath and wheezing.   Cardiovascular:  Negative for chest pain, palpitations, orthopnea and claudication.  Gastrointestinal:  Negative for abdominal pain, blood  in stool, constipation, diarrhea, heartburn, melena, nausea and vomiting.  Genitourinary:  Negative for dysuria, flank pain, frequency, hematuria and urgency.  Musculoskeletal:  Negative for back pain, joint pain and myalgias.  Skin:  Negative for rash.  Neurological:  Positive for headaches.  Negative for dizziness, tingling, focal weakness, seizures and weakness.  Endo/Heme/Allergies:  Does not bruise/bleed easily.  Psychiatric/Behavioral:  Negative for depression and suicidal ideas. The patient does not have insomnia.      Allergies  Allergen Reactions   Penicillin G Itching, Rash, Anaphylaxis and Palpitations   Lamictal [Lamotrigine] Rash   Carbamazepine Other (See Comments)    Medication interaction-prograf   Hydrocodone-Acetaminophen Itching   Naproxen Itching     Past Medical History:  Diagnosis Date   Abnormal uterine bleeding    Allergy    Anxiety    Arthritis    Bipolar disorder (manic depression) (HCC)    Chronic kidney failure    Chronic renal disease, stage III (HCC)    Diabetes mellitus without complication (HCC)    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[place]) 2015   Right axillary lymph node resected and chemo tx's.   FH: trigeminal neuralgia    GERD (gastroesophageal reflux disease)    Heart murmur    Hypertension    Kidney mass    Lupus (HCC)    Lupus (HCC)    Major depressive disorder    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[place]) 06/2019   Chemo tx's   Migraine    Morbid obesity (HCC)    Neuromuscular disorder (HCC)    neuropathy   Neuropathy    Personality disorder ([place])    Post herpetic neuralgia    PTSD (post-traumatic stress disorder)    Renal disorder    S/P liver transplant [facility])      Past Surgical History:  Procedure Laterality Date   BONE MARROW BIOPSY  01/13/2015   BREAST BIOPSY  12/2014   BREAST BIOPSY  2011   BREAST SURGERY     CHOLECYSTECTOMY     COLONOSCOPY     ESOPHAGOGASTRODUODENOSCOPY     ESOPHAGOGASTRODUODENOSCOPY (EGD) WITH PROPOFOL N/A 01/09/2021   Procedure: ESOPHAGOGASTRODUODENOSCOPY (EGD) WITH PROPOFOL;  Surgeon: [name], MD;  Location: [facility] ENDOSCOPY;  Service: Endoscopy;  Laterality: N/A;   HERNIA REPAIR     infusaport     LIVER TRANSPLANT  12/17/1991   LUMBAR PUNCTURE     PORT A CATH INJECTION ([place] HX)      tumor removal  2015    Social History   Socioeconomic History   Marital status: Single    Spouse name: Not on file   Number of children: 0   Years of education: 9   Highest education level: GED or equivalent  Occupational History   Occupation: disabled  Tobacco Use   Smoking status: Never   Smokeless tobacco: Never  Vaping Use   Vaping Use: Never used  Substance and Sexual Activity   Alcohol use: Not Currently   Drug use: Not Currently    Types: Marijuana    Comment: pain managment last used in early april   Sexual activity: Not Currently  Other Topics Concern   Not on file  Social History Narrative   Not on file   Social Determinants of Health   Financial Resource Strain: Not on file  Food Insecurity: Not on file  Transportation Needs: Not on file  Physical Activity: Not on file  Stress: Not on file  Social Connections: Not on file  Intimate  Partner Violence: Not on file    Family History  Problem Relation Age of Onset   Heart disease Mother    Hypertension Mother    Cancer - Other Mother    Bipolar disorder Mother    Heart disease Father    Hypertension Father    Diabetes Father    Parkinson's disease Maternal Grandmother    Cancer Maternal Aunt    Cancer Maternal Uncle    Cancer Maternal Grandfather    Lupus Paternal Grandmother    Hypertension Brother      Current Outpatient Medications:    baclofen (LIORESAL) 20 MG tablet, Take 1 tablet (20 mg total) by mouth 3 (three) times daily as needed for muscle spasms., Disp: 90 each, Rfl: 2   benztropine (COGENTIN) 1 MG tablet, Take 1 tablet (1 mg total) by mouth daily as needed for tremors., Disp: 30 tablet, Rfl: 1   Dulaglutide (TRULICITY) 0.81 KG/8.1EH SOPN, Inject 0.75 mg into the skin once a week. (Patient taking differently: Inject 0.75 mg into the skin every Wednesday.), Disp: 3 mL, Rfl: 3   DULoxetine (CYMBALTA) 30 MG capsule, Take 1 capsule (30 mg total) by mouth 2 (two) times daily., Disp: 180  capsule, Rfl: 0   ezetimibe (ZETIA) 10 MG tablet, Take 10 mg by mouth daily., Disp: , Rfl:    hydrOXYzine (VISTARIL) 25 MG capsule, Take by mouth., Disp: , Rfl:    icosapent Ethyl (VASCEPA) 1 g capsule, Take by mouth., Disp: , Rfl:    lisinopril (ZESTRIL) 20 MG tablet, Take 20 mg by mouth daily., Disp: , Rfl:    Magnesium 500 MG CAPS, Take by mouth., Disp: , Rfl:    Melatonin 10 MG TABS, Take 10 mg by mouth at bedtime as needed (sleep)., Disp: , Rfl:    metFORMIN (GLUCOPHAGE) 1000 MG tablet, Take 1 tablet (1,000 mg total) by mouth 2 (two) times daily with a meal., Disp: 180 tablet, Rfl: 3   mirabegron ER (MYRBETRIQ) 50 MG TB24 tablet, Take 1 tablet (50 mg total) by mouth daily., Disp: 90 tablet, Rfl: 3   Multiple Vitamin (MULTI-VITAMIN) tablet, Take 1 tablet by mouth daily., Disp: , Rfl:    ondansetron (ZOFRAN ODT) 4 MG disintegrating tablet, Take 1 tablet (4 mg total) by mouth every 8 (eight) hours as needed for nausea or vomiting., Disp: 20 tablet, Rfl: 0   oxyCODONE ER (XTAMPZA ER) 13.5 MG C12A, Take 1 tablet by mouth 2 (two) times daily., Disp: , Rfl:    PARoxetine (PAXIL) 10 MG tablet, Take 10 mg by mouth daily., Disp: , Rfl:    pregabalin (LYRICA) 200 MG capsule, Take 200 mg by mouth 2 (two) times daily., Disp: , Rfl:    Pyridoxine HCl (B-6 PO), Take by mouth., Disp: , Rfl:    QUEtiapine (SEROQUEL XR) 300 MG 24 hr tablet, Take 1 tablet (300 mg total) by mouth at bedtime., Disp: 90 tablet, Rfl: 0   QUEtiapine (SEROQUEL XR) 50 MG TB24 24 hr tablet, Take 1 tablet (50 mg total) by mouth at bedtime. Take along with 300 mg at bedtime, Disp: 90 tablet, Rfl: 0   tacrolimus (PROGRAF) 1 MG capsule, Take 1 capsule by mouth once daily (Patient taking differently: Take 1 mg by mouth 2 (two) times daily.), Disp: 30 capsule, Rfl: 0   VITAMIN D, CHOLECALCIFEROL, PO, Take by mouth., Disp: , Rfl:    glucose blood (COOL BLOOD GLUCOSE TEST STRIPS) test strip, Use as instructed (Patient not taking: Reported on  04/10/2021),  Disp: 100 each, Rfl: 12   promethazine (PHENERGAN) 25 MG tablet, Take 1 tablet (25 mg total) by mouth as needed. (Patient not taking: No sig reported), Disp: 15 tablet, Rfl: 0  Current Facility-Administered Medications:    sulfamethoxazole-trimethoprim (BACTRIM DS) 800-160 MG per tablet 1 tablet, 1 tablet, Oral, Q12H, Hollice Espy, MD, 1 tablet at 01/17/21 1432  Physical exam:  Vitals:   04/10/21 1142  BP: 127/89  Pulse: 90  Resp: 18  Temp: 98 F (36.7 C)  TempSrc: Tympanic  SpO2: 97%  Weight: 257 lb (116.6 kg)   Physical Exam Constitutional:      General: She is not in acute distress. Cardiovascular:     Rate and Rhythm: Normal rate and regular rhythm.     Heart sounds: Normal heart sounds.  Pulmonary:     Effort: Pulmonary effort is normal.     Breath sounds: Normal breath sounds.  Abdominal:     General: Bowel sounds are normal.     Palpations: Abdomen is soft.  Lymphadenopathy:     Comments: No palpable bilateral cervical supraclavicular axillary or inguinal adenopathy.  No palpable swelling over the left cheek  Skin:    General: Skin is warm and dry.  Neurological:     Mental Status: She is alert and oriented to person, place, and time.     CMP Latest Ref Rng & Units 04/10/2021  Glucose 70 - 99 mg/dL 172(H)  BUN 6 - 20 mg/dL 19  Creatinine 0.44 - 1.00 mg/dL 1.06(H)  Sodium 135 - 145 mmol/L 134(L)  Potassium 3.5 - 5.1 mmol/L 4.4  Chloride 98 - 111 mmol/L 98  CO2 22 - 32 mmol/L 25  Calcium 8.9 - 10.3 mg/dL 9.3  Total Protein 6.5 - 8.1 g/dL 7.3  Total Bilirubin 0.3 - 1.2 mg/dL 0.8  Alkaline Phos 38 - 126 U/L 68  AST 15 - 41 U/L 65(H)  ALT 0 - 44 U/L 61(H)   CBC Latest Ref Rng & Units 04/10/2021  WBC 4.0 - 10.5 K/uL 4.6  Hemoglobin 12.0 - 15.0 g/dL 13.9  Hematocrit 36.0 - 46.0 % 42.3  Platelets 150 - 400 K/uL 125(L)    No images are attached to the encounter.  US Soft Tissue Head/Neck  Result Date: 04/06/2021 CLINICAL DATA:  Provided history:  Extranodal marginal zone B-cell lymphoma of mucosa associated lymphoid tissue (MALT). Localized swelling, mass and lump, neck. Left neck swelling with history of lymphoma of left parotid. EXAM: ULTRASOUND OF HEAD/NECK SOFT TISSUES TECHNIQUE: Ultrasound examination of the head and neck soft tissues was performed in the area of clinical concern. COMPARISON:  Prior PET-CT examinations 07/08/2019 and earlier. Ultrasound of the bilateral parotid glands statin 05/27/2019. FINDINGS: Targeted ultrasound was performed in the left parotid and left submandibular gland regions of concern. Limited imaging of the contralateral right parotid and right submandibular glands was also acquired for the purposes of comparison. Apparent nodular lesion at the inferior aspect of the left parotid gland measuring 1.3 x 0.8 x 1.2 cm, with heterogeneous echotexture and internal color Doppler flow. No second lesion is appreciated. These results will be called to the ordering clinician or representative by the Radiologist Assistant, and communication documented in the PACS or [employer]. IMPRESSION: Apparent 1.3 cm nodular lesion at the inferior aspect of the left parotid gland with internal color Doppler flow. Given the patient's history, this finding is suspicious for an abnormal lymph node or small mass. A contrast-enhanced neck CT should be considered for further evaluation. Electronically  Signed   By: Kellie Simmering DO   On: 04/06/2021 14:55     Assessment and plan- Patient is a 45 y.o. 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  She is s/p 4 weekly cycles of Rituxan chemotherapy in September 2020 and here to discuss results of ultrasound neck  Patient was seen last week by NP Beckey Rutter for Symptoms of left-sided neck swelling.  This was followed by an ultrasound which showed a 1.3 x 0.8 x 1.2 cm nodular lesion in the left parotid gland.  CT neck was recommended.  Her labs are  presently stable.  She has baseline abnormal LFTs S/p liver transplant for autoimmune hepatitis in the past.  I do not palpate any cervical adenopathy.  I will await CT neck which will be done in 10 days time and see her thereafter and based on CT I will decide about biopsy versus PET scan  Patient was seen by neurology Dr. Manuella Ghazi for symptoms of trigeminal neuralgia And headaches and underwent SPEP testing which incidentally showed a small amount of monoclonal protein of 0.8 IgG lambda.  Repeat SPEP has been ordered at [facility] and the results of that are pending.  Typically an IgG M protein of less than 1.5 g in the absence of crab criteria does not warrant a bone marrow biopsy.  I will await those tests before deciding further management.   Visit Diagnosis 1. 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[place])   2. MGUS (monoclonal gammopathy of unknown significance)      Dr. Randa Evens, MD, MPH [facility] [phone] 04/10/2021 4:31 PM

## 2021-04-11 ENCOUNTER — Encounter: Payer: Self-pay | Admitting: Oncology

## 2021-04-11 LAB — PROTEIN ELECTROPHORESIS, SERUM
A/G Ratio: 1.3 (ref 0.7–1.7)
Albumin ELP: 3.8 g/dL (ref 2.9–4.4)
Alpha-1-Globulin: 0.2 g/dL (ref 0.0–0.4)
Alpha-2-Globulin: 0.9 g/dL (ref 0.4–1.0)
Beta Globulin: 1 g/dL (ref 0.7–1.3)
Gamma Globulin: 1 g/dL (ref 0.4–1.8)
Globulin, Total: 3 g/dL (ref 2.2–3.9)
M-Spike, %: 0.6 g/dL — ABNORMAL HIGH
Total Protein ELP: 6.8 g/dL (ref 6.0–8.5)

## 2021-04-13 DIAGNOSIS — M25512 Pain in left shoulder: Secondary | ICD-10-CM | POA: Diagnosis not present

## 2021-04-16 DIAGNOSIS — K7581 Nonalcoholic steatohepatitis (NASH): Secondary | ICD-10-CM | POA: Diagnosis not present

## 2021-04-16 DIAGNOSIS — E669 Obesity, unspecified: Secondary | ICD-10-CM | POA: Diagnosis not present

## 2021-04-16 DIAGNOSIS — Z944 Liver transplant status: Secondary | ICD-10-CM | POA: Diagnosis not present

## 2021-04-16 DIAGNOSIS — I10 Essential (primary) hypertension: Secondary | ICD-10-CM | POA: Diagnosis not present

## 2021-04-16 DIAGNOSIS — C8580 Other specified types of non-Hodgkin lymphoma, unspecified site: Secondary | ICD-10-CM | POA: Diagnosis not present

## 2021-04-16 DIAGNOSIS — R32 Unspecified urinary incontinence: Secondary | ICD-10-CM | POA: Diagnosis not present

## 2021-04-16 DIAGNOSIS — F3131 Bipolar disorder, current episode depressed, mild: Secondary | ICD-10-CM | POA: Diagnosis not present

## 2021-04-16 DIAGNOSIS — E7849 Other hyperlipidemia: Secondary | ICD-10-CM | POA: Diagnosis not present

## 2021-04-16 DIAGNOSIS — E1165 Type 2 diabetes mellitus with hyperglycemia: Secondary | ICD-10-CM | POA: Diagnosis not present

## 2021-04-16 DIAGNOSIS — Z7189 Other specified counseling: Secondary | ICD-10-CM | POA: Diagnosis not present

## 2021-04-16 DIAGNOSIS — Z6838 Body mass index (BMI) 38.0-38.9, adult: Secondary | ICD-10-CM | POA: Diagnosis not present

## 2021-04-16 LAB — IMMUNOFIXATION ELECTROPHORESIS
IgA: 137 mg/dL (ref 87–352)
IgG (Immunoglobin G), Serum: 1046 mg/dL (ref 586–1602)
IgM (Immunoglobulin M), Srm: 287 mg/dL — ABNORMAL HIGH (ref 26–217)
Total Protein ELP: 6.7 g/dL (ref 6.0–8.5)

## 2021-04-17 DIAGNOSIS — F3181 Bipolar II disorder: Secondary | ICD-10-CM | POA: Diagnosis not present

## 2021-04-17 DIAGNOSIS — F4310 Post-traumatic stress disorder, unspecified: Secondary | ICD-10-CM | POA: Diagnosis not present

## 2021-04-17 DIAGNOSIS — F411 Generalized anxiety disorder: Secondary | ICD-10-CM | POA: Diagnosis not present

## 2021-04-18 ENCOUNTER — Telehealth: Payer: Medicaid Other | Admitting: Psychiatry

## 2021-04-18 ENCOUNTER — Ambulatory Visit: Payer: Self-pay | Admitting: Physician Assistant

## 2021-04-19 ENCOUNTER — Other Ambulatory Visit: Payer: Self-pay

## 2021-04-19 ENCOUNTER — Encounter: Payer: Self-pay | Admitting: Oncology

## 2021-04-19 ENCOUNTER — Other Ambulatory Visit: Payer: Self-pay | Admitting: Obstetrics and Gynecology

## 2021-04-19 NOTE — Patient Outreach (Signed)
Medicaid Managed Care   Nurse Care Manager Note  04/19/2021 Name:  Alison [name] MRN:  [id number] DOB:  July 16, 1976  Alison [name] is an 45 y.o. year old female who is a primary patient of [place].  The [facility] Managed Care Coordination team was consulted for assistance with:    Chronic healthcare management needs.  [name] was given information about Medicaid Managed Care Coordination team services today. Dellar Traber agreed to services and verbal consent obtained.  Engaged with patient by telephone for follow up visit in response to provider referral for case management and/or care coordination services.   Assessments/Interventions:  Review of past medical history, allergies, medications, health status, including review of consultants reports, laboratory and other test data, was performed as part of comprehensive evaluation and provision of chronic care management services.  SDOH (Social Determinants of Health) assessments and interventions performed:   Care Plan  Allergies  Allergen Reactions   Penicillin G Itching, Rash, Anaphylaxis and Palpitations   Lamictal [Lamotrigine] Rash   Carbamazepine Other (See Comments)    Medication interaction-prograf   Hydrocodone-Acetaminophen Itching   Naproxen Itching    Medications Reviewed Today     Reviewed by [name], RN (Registered Nurse) on 04/19/21 at [phone]  Med List Status: <None>   Medication Order Taking? Sig Documenting Provider Last Dose Status Informant  baclofen (LIORESAL) 20 MG tablet [id number] No Take 1 tablet (20 mg total) by mouth 3 (three) times daily as needed for muscle spasms. [name], FNP Taking Active   benztropine (COGENTIN) 1 MG tablet [id number] No Take 1 tablet (1 mg total) by mouth daily as needed for tremors. [name], MD Taking Active Self  Dulaglutide (TRULICITY) 2.48 GN/0.0BB SOPN [id number] No Inject 0.75 mg into the skin once a week.  Patient taking  differently: Inject 0.75 mg into the skin every Wednesday.   Myles Gip, DO Taking Active   DULoxetine (CYMBALTA) 30 MG capsule [id number] No Take 1 capsule (30 mg total) by mouth 2 (two) times daily. [name], MD Taking Active   ezetimibe (ZETIA) 10 MG tablet [id number] No Take 10 mg by mouth daily. [provider] Taking Active   glucose blood (COOL BLOOD GLUCOSE TEST STRIPS) test strip [id number] No Use as instructed  Patient not taking: Reported on 04/10/2021   Steele Sizer, MD Not Taking Active   hydrOXYzine (VISTARIL) 25 MG capsule [id number] No Take by mouth. [provider] Taking Active   icosapent Ethyl (VASCEPA) 1 g capsule [id number] No Take by mouth. [provider] Taking Active   lisinopril (ZESTRIL) 20 MG tablet [id number] No Take 20 mg by mouth daily. [provider] Taking Active   Magnesium 500 MG CAPS [id number] No Take by mouth. [provider] Taking Active   Melatonin 10 MG TABS [id number] No Take 10 mg by mouth at bedtime as needed (sleep). [provider] Taking Active Self  metFORMIN (GLUCOPHAGE) 1000 MG tablet [id number] No Take 1 tablet (1,000 mg total) by mouth 2 (two) times daily with a meal. Myles Gip, DO Taking Active Self  mirabegron ER (MYRBETRIQ) 50 MG TB24 tablet [id number] No Take 1 tablet (50 mg total) by mouth daily. Debroah Loop, PA-C Taking Active Self  Multiple Vitamin (MULTI-VITAMIN) tablet [id number] No Take 1 tablet by mouth daily. [provider] Taking Active Self  ondansetron (ZOFRAN ODT) 4 MG disintegrating tablet [id number] No Take 1 tablet (4 mg total) by mouth every 8 (eight) hours as needed  for nausea or vomiting. Paulette Blanch, MD Taking Active   oxyCODONE ER [facility] ER) 13.5 MG C12A [id number] No Take 1 tablet by mouth 2 (two) times daily. [provider] Taking Active   PARoxetine (PAXIL) 10 MG tablet [id number] No Take 10 mg by mouth daily. [provider] Taking Active   pregabalin (LYRICA) 200 MG capsule [id number] No Take 200 mg by mouth 2 (two) times daily. [provider] Taking Active   promethazine (PHENERGAN) 25 MG tablet [id number] No Take 1 tablet (25 mg total) by mouth as needed.  Patient not taking: No sig reported   Myles Gip, DO Not Taking Active Self  Pyridoxine HCl (B-6 PO) [id number] No Take by mouth. [provider] Taking Active   QUEtiapine (SEROQUEL XR) 300 MG 24 hr tablet [id number] No Take 1 tablet (300 mg total) by mouth at bedtime. [name], MD Taking Active   QUEtiapine (SEROQUEL XR) 50 MG TB24 24 hr tablet [id number] No Take 1 tablet (50 mg total) by mouth at bedtime. Take along with 300 mg at bedtime [name], MD Taking Active   sulfamethoxazole-trimethoprim (BACTRIM DS) 800-160 MG per tablet 1 tablet [id number]   Hollice Espy, MD  Active   tacrolimus (PROGRAF) 1 MG capsule [id number] No Take 1 capsule by mouth once daily  Patient taking differently: Take 1 mg by mouth 2 (two) times daily.   [name], FNP Taking Active            Med Note Alison [name], Alison [name]   Fri Jan 19, 2021  5:01 AM)    VITAMIN D, CHOLECALCIFEROL, PO [id number] No Take by mouth. 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   Edema of lower extremity 09/16/2019   Carpal tunnel syndrome, left 07/26/2019   Cubital tunnel syndrome on left 07/26/2019   Bipolar I disorder, most recent episode depressed ([place]) 07/12/2019   PTSD (post-traumatic stress  disorder) 68/61/6837   Umbilical hernia without obstruction and without gangrene 06/17/2019   Encounter for surveillance of abnormal nevi 06/17/2019   Pineal gland cyst 06/17/2019   Chronic hip pain, left 06/02/2019   Lumbar spondylosis 06/02/2019   Mouth dryness 06/02/2019   Obesity (BMI 35.0-39.9 without comorbidity) 06/02/2019   Goals of care, counseling/discussion 05/18/2019   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[place]) 05/18/2019   Occipital neuralgia 05/13/2019   Plantar fasciitis of left foot 04/27/2019   Fibromyalgia 03/23/2019   Systemic lupus erythematosus (SLE) in adult [facility]) 03/23/2019   Essential hypertension 03/23/2019   Hepatitis 03/23/2019   Mass of left kidney 03/23/2019   Diabetes mellitus ([place]) 03/23/2019   Nausea without vomiting 03/23/2019   Neuropathy 03/23/2019   Cancer ([place]) 03/23/2019   Hyperlipidemia 03/23/2019   Osteoarthritis 03/23/2019   Trigeminal neuralgia 03/23/2019   Chronic renal disease, stage III ([place]) 03/23/2019   Nephrolithiasis 03/23/2019   Migraine 03/23/2019   OSA on CPAP 03/23/2019   Fibrocystic breast 03/23/2019   Heart murmur 03/23/2019   Bipolar disorder,  in partial remission, most recent episode depressed ([place]) 07/02/2017   Abnormal uterine bleeding 03/18/2017   Major depressive disorder, recurrent ([place]) 03/18/2017   Morbid obesity ([place]) 03/18/2017   Secondary hyperparathyroidism ([place]) 02/07/2017   Neuropathic pain 11/19/2016   Post herpetic neuralgia 11/19/2016   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[place]) 01/13/2015   Lumbar disc disease with radiculopathy 07/26/2013   S/P liver transplant ([place]) 07/26/2013   Type 2 diabetes mellitus, uncontrolled ([place]) 07/26/2013   Facial nerve disorder 06/29/2012   Low back pain 12/26/2010    Conditions to be addressed/monitored per PCP order:   chronic healthcare management needs, fibromyalgia,  DM, DLBCL, depression, osteoarthritis, OSA, migraines, PTSD  Care Plan : Chronic Pain (Adult)  Updates made by [name], RN since 04/19/2021 12:00 AM     Problem: Chronic Pain Management-fibromyalgia   Priority: High  Onset Date: 01/22/2021     Long-Range Goal: Fibromyalgia pain managed-new pain management provider   Start Date: 08/14/2020  Expected End Date: 05/24/2021  Recent Progress: On track  Priority: High  Note:   Current Barriers:  Care Coordination needs related to pain management provider.   Patient is experiencing fibromyalgia flair and is interested in help being connected to a pain management provider. Unable to independently secure referral or appointment for pain management provider. Update 11/24/20:  Patient is being followed by Dr. Humphrey Rolls.  Nurse Case Manager Clinical Goal(s):  Over the next 45 days, patient will work with [facility] to address needs related to referral for pain management provider and associated care coordination needs. Update 09/22/20:  patient is currently seeing Dr. Humphrey Rolls at [facility].  Interventions:  Inter-disciplinary care team collaboration (see longitudinal plan of care) Evaluation of current treatment plan related to fibromyalgia  and patient's adherence to plan as established by provider. Update 02/21/21:  Patient has restarted Cymbalta. Update 04/19/21:  No complaints today. Collaborated with primary care provider regarding recommendations and referral to pain management provider. Discussed plans with patient for ongoing care management follow up and provided patient with direct contact information for care management team Anticipate pain education program, pain management support as part of pain management referral.      [name] contacted patient to check in. Patient stated she has not contacted an eye doctor nor has she seen a specialist. Patient states she thinks she has COVID due to her husband having it. She has not taken  a test yet, but has some symptoms. She is isolating in the bedroom and they have someone to cook and bring her food.       Update 11/16/20: [name] completed phone call with patient, she states she is still having symptoms from [place] and has been in bed all month. Patient states she has not had a chance to contact an eye doctor or specialist due to being sick. Update 11/24/20:  Patient with continued fatigue-will follow up with PCP. Update 01/22/21:  patient continues to follow up with provider. Pharmacy referral for medication review. Update 01/22/21:  Patient met with Pharmacist-continuing to follow. Update 12/14/20: [name] completed call with patient, she stated she is waiting on a call from [facility] about getting a liver biopsy completed. She has not reached out to the eye doctor yet because her liver is top priority right now. Update 12/22/20:  Patient seen by PCP 12/11/20.  Has not seen Opthamologist yet.  Had liver biopsy 12/20/20 due to increased liver enzymes.  Update 01/15/21: [name] completed a follow up with patient.  She stated she found out she has to have a liver transplant and is currently completing testing. Patient stated that she has been in pain since her pcp took her off of her pain medication. Patient stated her rent will be going up by $400 and they have to move. They will most likely move to [place] area because her fiance' works in that area. No resources needed at this time. Update 03/20/21:  Pain states no pain at this time.  Has decided to stay in current apartment.  Patient states her liver function tests have improved and she is stable right now.  Patient Goals/Self-Care Activities Over the next 45 days, patient will:  -Attends all scheduled provider appointments  develop a personal pain management plan with your pain management provider.  Follow Up Plan:  The Managed Medicaid care management team will reach out to the patient again over the next 30 days.     Care Plan : Wellness  (Adult)  Updates made by [name], RN since 04/19/2021 12:00 AM     Problem: Medication Adherence (Wellness)   Priority: High  Onset Date: 01/22/2021     Long-Range Goal: Medication Adherence Maintained   Start Date: 09/06/2020  Expected End Date: 05/24/2021  Recent Progress: Not on track  Priority: High  Note:   Current Barriers:  Patient states she needs to have prescriptions refilled and feels she has some side effects from her medications. Patient with new neck lump-has CT scheduled 04/24/21, ? Lymphoma. Update 12/22/20:  Patient's PCP discontinued Victoza and added Trulicity.  Patient has not been taking mediations as prescribed.  Hgb A1C 8.1 on 12/11/20. Update 01/22/21:  Patient states she is taking medications.  She checks blood sugar once a day and needs more testing strips.  Blood sugars 200's per patient. Update 02/21/21:  Patient is checking blood sugars 4 times a day.  Has appointment with endocrinologist today and is keeping food diary. Update 03/20/21:  Blood sugars mid 100's perpatient-has appointment with nutritionist 03/28/21. Update 04/20/21:  Blood sugars 220-300 per patient-states she saw Provider and Nutritionist-is working on reducing carbohydrates.  Nurse Case Manager Clinical Goal(s):  Over the next 30 days, patient will work with CM team pharmacist to review current medications. Update 01/22/21:  Patient met with Pharmacist and continues to follow.  Interventions:  Inter-disciplinary care team collaboration (see longitudinal plan of care) Evaluation of current treatment plan and patient's adherence to plan as established by provider. Advised patient to contact her PCP for any medication needs. Reviewed medications with patient. Collaborated with pharmacy regarding medications.  Discussed plans with patient for ongoing care management follow up and provided patient with direct contact information for care management team Pharmacy referral for medication  review. Will notify PCP of need for testing strips and dietician referral. Update 02/21/21:  Patient has appointment with nutritionist 03/28/21 and has all testing supplies needed.  Patient Goals/Self-Care Activities Over the next 30 days, patient will:  -Patient will take medications as prescribed. Calls pharmacy for medication refills Calls provider office for new concerns or questions  Follow Up Plan: The Managed Medicaid care management team will reach out to the patient again over the next 30 days.  The patient has been provided with contact information for the Managed Medicaid care management team and has been advised to call with any health related questions or concerns.     Follow Up:  Patient agrees to Care Plan and Follow-up.  Plan: The Managed Medicaid care management team will  reach out to the patient again over the next 30 days. and The patient has been provided with contact information for the Managed Medicaid care management team and has been advised to call with any health related questions or concerns.  Date/time of next scheduled RN care management/care coordination outreach:  05/15/21 at 1245.

## 2021-04-19 NOTE — Patient Instructions (Signed)
Hi Alison [name], thank you for speaking with me this morning-I hope you have a nice day!  Alison [name] was given information about Medicaid Managed Care team care coordination services as a part of their Healthy [facility] Medicaid benefit. Alison [name] verbally consented to engagement with the [facility] Managed Care team.   For questions related to your Healthy [facility] health plan, please call: [phone] or visit the homepage here: [email or web address]  If you would like to schedule transportation through your Healthy [facility] plan, please call the following number at least 2 days in advance of your appointment: [phone]  Call the [facility] at [phone], at any time, 24 hours a day, 7 days a week. If you are in danger or need immediate medical attention call 911.  If you would like help to quit smoking, call 1-800-QUIT-NOW (906)590-8429) OR Espaol: 1-855-Djelo-Ya (3-545-625-6389) o para ms informacin haga clic aqu or Text READY to 200-400 to register via text  Alison [name] - following are the goals we discussed in your visit today:   Goals Addressed             This Visit's Progress    Chronic Pain Managed        Update 01/22/21:  Patient sees Dr. Humphrey Rolls for chronic pain management. Update 02/21/21:  Patient is restarting Cymbalta. Update 03/20/21:  Chronic pain stable per patient-no complaints today-recently saw Dr. Humphrey Rolls Update 04/19/21:  No complaints today.  Evidence-based guidance:  Address common beliefs about pain, such as pain is to be endured, a normal part of aging or that it is not "real"; feelings of resignation that nothing can be done and that complaining will be a sign of weakness.  Assess pain level, treatment efficacy and patient response at regular intervals using a consistent pain scale.  Assess pain using self-report (most reliable), family/caregiver report, validated pain scale; consider impact  on quality of life.  Determine if pain is associated with mobility or at rest, location, intensity, frequency, duration, recurrence, pattern and description (e.g., cramping, burning, aching), triggers and relieving factors.   Anticipate referral to pain education program, pain management support or community resources for specific diagnoses (e.g., cancer, fibromyalgia, multiple sclerosis).  Explore fears associated with anticipated or imagined pain; encourage acceptance-based approaches.  Encourage exposure to experiences previously avoided due to fear of pain.  Anticipate referral to pain management specialist, physical therapist, addiction specialist (if history of substance use), psychotherapist; advocate for consultation with pharmacist.  Initiate nonpharmacologic measures, such as cognitive behavior therapy, mindfulness, guided imagery, massage, distraction, relaxation, chiropractic manipulation, dietary supplements or acupuncture.  Provide multimodal treatment interventions, such as physical activity, therapeutic exercise, yoga, TENS (transcutaneous electrical nerve stimulation) and manual therapy.  Train in functional activity modifications, such as body mechanics, posture, ergonomics, energy conservation and activity pacing.  Encourage use of local anesthetic or analgesic therapy as an adjunct for pain control (e.g., lidocaine patch, capsaicin cream, topical nonsteroidal anti-inflammatory drugs).  Prepare patient for use of pharmacologic therapy in a stepped approach that may include acetaminophen, nonsteroidal anti-inflammatory drugs, opioid, antiepileptic, antidepressant or nonbenzodiazepine muscle relaxant.  Review efficacy, tolerability, adherence and manage medication-induced side effects.           Patient verbalizes understanding of instructions provided today.   The Managed Medicaid care management team will reach out to the patient again over the next 30 days.  The  Patient has  been provided with contact information for the Managed Medicaid care management team and has been  advised to call with any health related questions or concerns.   [name], [name] [place] Management Coordinator - Managed Medicaid High Risk [phone]    Following is a copy of your plan of care:  Patient Care Plan: Chronic Pain (Adult)     Problem Identified: Chronic Pain Management-fibromyalgia   Priority: High  Onset Date: 01/22/2021     Long-Range Goal: Fibromyalgia pain managed-new pain management provider   Start Date: 08/14/2020  Expected End Date: 05/24/2021  Recent Progress: On track  Priority: High  Note:   Current Barriers:  Care Coordination needs related to pain management provider.   Patient is experiencing fibromyalgia flair and is interested in help being connected to a pain management provider. Unable to independently secure referral or appointment for pain management provider. Update 11/24/20:  Patient is being followed by Dr. Humphrey Rolls.  Nurse Case Manager Clinical Goal(s):  Over the next 45 days, patient will work with [facility] to address needs related to referral for pain management provider and associated care coordination needs. Update 09/22/20:  patient is currently seeing Dr. Humphrey Rolls at [facility].  Interventions:  Inter-disciplinary care team collaboration (see longitudinal plan of care) Evaluation of current treatment plan related to fibromyalgia  and patient's adherence to plan as established by provider. Update 02/21/21:  Patient has restarted Cymbalta. Update 04/19/21:  No complaints today. Collaborated with primary care provider regarding recommendations and referral to pain management provider. Discussed plans with patient for ongoing care management follow up and provided patient with direct contact information for care management team Anticipate pain education program, pain management support as part  of pain management referral.      [name] contacted patient to check in. Patient stated she has not contacted an eye doctor nor has she seen a specialist. Patient states she thinks she has COVID due to her husband having it. She has not taken a test yet, but has some symptoms. She is isolating in the bedroom and they have someone to cook and bring her food.       Update 11/16/20: [name] completed phone call with patient, she states she is still having symptoms from [place] and has been in bed all month. Patient states she has not had a chance to contact an eye doctor or specialist due to being sick. Update 11/24/20:  Patient with continued fatigue-will follow up with PCP. Update 01/22/21:  patient continues to follow up with provider. Pharmacy referral for medication review. Update 01/22/21:  Patient met with Pharmacist-continuing to follow. Update 12/14/20: [name] completed call with patient, she stated she is waiting on a call from [facility] about getting a liver biopsy completed. She has not reached out to the eye doctor yet because her liver is top priority right now. Update 12/22/20:  Patient seen by PCP 12/11/20.  Has not seen Opthamologist yet.  Had liver biopsy 12/20/20 due to increased liver enzymes.  Update 01/15/21: [name] completed a follow up with patient. She stated she found out she has to have a liver transplant and is currently completing testing. Patient stated that she has been in pain since her pcp took her off of her pain medication. Patient stated her rent will be going up by $400 and they have to move. They will most likely move to [place] area because her fiance' works in that area. No resources needed at this time. Update 03/20/21:  Pain states no pain at this time.  Has decided to  stay in current apartment.  Patient states her liver function tests have improved and she is stable right now.  Patient Goals/Self-Care Activities Over the next 45 days, patient will:  -Attends all scheduled provider  appointments  develop a personal pain management plan with your pain management provider.  Follow Up Plan:  The Managed Medicaid care management team will reach out to the patient again over the next 30 days.     Patient Care Plan: Wellness (Adult)     Problem Identified: Medication Adherence (Wellness)   Priority: High  Onset Date: 01/22/2021     Long-Range Goal: Medication Adherence Maintained   Start Date: 09/06/2020  Expected End Date: 05/24/2021  Recent Progress: Not on track  Priority: High  Note:   Current Barriers:  Patient states she needs to have prescriptions refilled and feels she has some side effects from her medications. Patient with new neck lump-has CT scheduled 04/24/21, ? Lymphoma. Update 12/22/20:  Patient's PCP discontinued Victoza and added Trulicity.  Patient has not been taking mediations as prescribed.  Hgb A1C 8.1 on 12/11/20. Update 01/22/21:  Patient states she is taking medications.  She checks blood sugar once a day and needs more testing strips.  Blood sugars 200's per patient. Update 02/21/21:  Patient is checking blood sugars 4 times a day.  Has appointment with endocrinologist today and is keeping food diary. Update 03/20/21:  Blood sugars mid 100's perpatient-has appointment with nutritionist 03/28/21. Update 04/20/21:  Blood sugars 220-300 per patient-states she saw Provider and Nutritionist-is working on reducing carbohydrates.  Nurse Case Manager Clinical Goal(s):  Over the next 30 days, patient will work with CM team pharmacist to review current medications. Update 01/22/21:  Patient met with Pharmacist and continues to follow.  Interventions:  Inter-disciplinary care team collaboration (see longitudinal plan of care) Evaluation of current treatment plan and patient's adherence to plan as established by provider. Advised patient to contact her PCP for any medication needs. Reviewed medications with patient. Collaborated with pharmacy regarding  medications.  Discussed plans with patient for ongoing care management follow up and provided patient with direct contact information for care management team Pharmacy referral for medication review. Will notify PCP of need for testing strips and dietician referral. Update 02/21/21:  Patient has appointment with nutritionist 03/28/21 and has all testing supplies needed.  Patient Goals/Self-Care Activities Over the next 30 days, patient will:  -Patient will take medications as prescribed. Calls pharmacy for medication refills Calls provider office for new concerns or questions  Follow Up Plan: The Managed Medicaid care management team will reach out to the patient again over the next 30 days.  The patient has been provided with contact information for the Managed Medicaid care management team and has been advised to call with any health related questions or concerns.

## 2021-04-20 ENCOUNTER — Encounter: Payer: Self-pay | Admitting: Physician Assistant

## 2021-04-24 ENCOUNTER — Ambulatory Visit
Admission: RE | Admit: 2021-04-24 | Discharge: 2021-04-24 | Disposition: A | Discharge status: Home / Self Care | Payer: Medicaid Other | Source: Ambulatory Visit | Attending: Oncology | Admitting: Oncology

## 2021-04-24 ENCOUNTER — Other Ambulatory Visit: Payer: Self-pay

## 2021-04-24 DIAGNOSIS — C884 Extranodal marginal zone B-cell lymphoma of mucosa-associated lymphoid tissue [MALT-lymphoma]: Secondary | ICD-10-CM | POA: Insufficient documentation

## 2021-04-24 DIAGNOSIS — R590 Localized enlarged lymph nodes: Secondary | ICD-10-CM | POA: Diagnosis not present

## 2021-04-24 MED ORDER — IOHEXOL 300 MG/ML  SOLN
75.0000 mL | Freq: Once | INTRAMUSCULAR | Status: AC | PRN
  Administered 2021-04-24: 75 mL via INTRAVENOUS

## 2021-04-30 ENCOUNTER — Encounter: Payer: Self-pay | Admitting: Oncology

## 2021-04-30 ENCOUNTER — Other Ambulatory Visit: Payer: Self-pay

## 2021-04-30 ENCOUNTER — Inpatient Hospital Stay (HOSPITAL_BASED_OUTPATIENT_CLINIC_OR_DEPARTMENT_OTHER): Payer: Medicaid Other | Admitting: Oncology

## 2021-04-30 VITALS — BP 122/81 | HR 78 | Temp 98.3°F | Resp 18 | Wt 253.7 lb

## 2021-04-30 DIAGNOSIS — Z08 Encounter for follow-up examination after completed treatment for malignant neoplasm: Secondary | ICD-10-CM

## 2021-04-30 DIAGNOSIS — D472 Monoclonal gammopathy: Secondary | ICD-10-CM | POA: Diagnosis not present

## 2021-04-30 DIAGNOSIS — Z8572 Personal history of non-Hodgkin lymphomas: Secondary | ICD-10-CM | POA: Diagnosis not present

## 2021-04-30 DIAGNOSIS — C884 Extranodal marginal zone B-cell lymphoma of mucosa-associated lymphoid tissue [MALT-lymphoma]: Secondary | ICD-10-CM | POA: Diagnosis not present

## 2021-04-30 NOTE — Progress Notes (Signed)
Jaw pain on and off for the past couple of weeks; since last time she has seen Dr. Janese Banks it has swelled up and went back down. Has not taken anything for it. Pt states she will go to sleep around 9pm and wake back up around 11pm. Will stay awake for about 3-4 hours then is able to go back to bed. Anything wakes her up, along with major night sweats. As well as, when she feels like she has to go urinate she has to rush to the bathroom and at times is not able to make it. Has been using the depends at times in order not to mess her bed up.

## 2021-04-30 NOTE — Progress Notes (Signed)
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  She is currently on CellCept and tacrolimus for the same.  She also has a history of post transplant lymphoproliferative disorder/DLBCL that was diagnosed in 2016.  She is s/p 6 cycles of R-CHOP chemotherapy back then and was in complete remission 1.  Her other past medical history significant for migraines, stage III chronic kidney disease, chemo-induced peripheral neuropathy, hypertension among other medical problems.  With regards to her diffuse large B-cell lymphoma she was getting surveillance scans and this was all done in [place].  She has now moved to [facility] to be with her significant other.  Patient was noted to have a prior kidney mass before which was biopsied and was not consistent with malignancy.   She underwent CT neck with contrast before she left [place] on 02/22/2019.  She was noted to have soft tissue masses in both the parotid glands which were new as compared to prior exams and possibly represent lymph nodes.  The largest one was seen in the left parotid gland measuring 1.3 x 1.1 cm.  Before she could get a work-up for this patient moved to [place]  and has not had any further work-up yet   Her other prior imaging is as follows: MRI thoracic spine without contrast in August 2019 showed moderate multilevel spondylosis but no evidence of lymphoma.  Multinodular thyroid in June 2019.  MRI cervical spine May 2019 again showed spondylosis but no other acute pathology.  MRI brain showed incidental partial opacification of the right and right sinus.  No evidence of malignancy.  I do not have any other PET CT scan or treatment records from the past.   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  Bone marrow biopsy was negative for lymphoma.  Patient completed 4 cycles of weekly RituxanIn September 2020.  Repeat PET CT scan showed interval resolution of the parotid lesions compatible with complete response to therapy    Interval history-patient continues to report some fatigue and ongoing intermittent night sweats.  She feels discomfort below the area of her left jawbone  ECOG PS- 1 Pain scale- 2   Review of systems- Review of Systems  Constitutional:  Positive for malaise/fatigue. Negative for chills, fever and weight loss.       Night sweats  HENT:  Negative for congestion, ear discharge and nosebleeds.   Eyes:  Negative for blurred vision.  Respiratory:  Negative for cough, hemoptysis, sputum production, shortness of breath and wheezing.   Cardiovascular:  Negative for chest pain, palpitations, orthopnea and claudication.  Gastrointestinal:  Negative for abdominal pain, blood in stool, constipation, diarrhea, heartburn, melena, nausea and vomiting.  Genitourinary:  Negative for dysuria, flank pain, frequency, hematuria and urgency.  Musculoskeletal:  Negative for  back pain, joint pain and myalgias.  Skin:  Negative for rash.  Neurological:  Negative for dizziness, tingling, focal weakness, seizures, weakness and headaches.  Endo/Heme/Allergies:  Does not bruise/bleed easily.  Psychiatric/Behavioral:   Negative for depression and suicidal ideas. The patient does not have insomnia.     Allergies  Allergen Reactions   Penicillin G Itching, Rash, Anaphylaxis and Palpitations   Lamictal [Lamotrigine] Rash   Carbamazepine Other (See Comments)    Medication interaction-prograf   Hydrocodone-Acetaminophen Itching   Naproxen Itching     Past Medical History:  Diagnosis Date   Abnormal uterine bleeding    Allergy    Anxiety    Arthritis    Bipolar disorder (manic depression) (HCC)    Chronic kidney failure    Chronic renal disease, stage III (HCC)    Diabetes mellitus without complication (HCC)    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[place]) 2015   Right axillary lymph node resected and chemo tx's.   FH: trigeminal neuralgia    GERD (gastroesophageal reflux disease)    Heart murmur    Hypertension    Kidney mass    Lupus (HCC)    Lupus (HCC)    Major depressive disorder    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[place]) 06/2019   Chemo tx's   Migraine    Morbid obesity (HCC)    Neuromuscular disorder (HCC)    neuropathy   Neuropathy    Personality disorder ([place])    Post herpetic neuralgia    PTSD (post-traumatic stress disorder)    Renal disorder    S/P liver transplant [facility])      Past Surgical History:  Procedure Laterality Date   BONE MARROW BIOPSY  01/13/2015   BREAST BIOPSY  12/2014   BREAST BIOPSY  2011   BREAST SURGERY     CHOLECYSTECTOMY     COLONOSCOPY     ESOPHAGOGASTRODUODENOSCOPY     ESOPHAGOGASTRODUODENOSCOPY (EGD) WITH PROPOFOL N/A 01/09/2021   Procedure: ESOPHAGOGASTRODUODENOSCOPY (EGD) WITH PROPOFOL;  Surgeon: [name], MD;  Location: [facility] ENDOSCOPY;  Service: Endoscopy;  Laterality: N/A;   HERNIA REPAIR     infusaport     LIVER TRANSPLANT  12/17/1991   LUMBAR PUNCTURE     PORT A CATH INJECTION ([place] HX)     tumor removal  2015    Social History   Socioeconomic History   Marital status: Single    Spouse name: Not on file   Number of children: 0    Years of education: 9   Highest education level: GED or equivalent  Occupational History   Occupation: disabled  Tobacco Use   Smoking status: Never   Smokeless tobacco: Never  Vaping Use   Vaping Use: Never used  Substance and Sexual Activity   Alcohol use: Not Currently   Drug use: Not Currently    Types: Marijuana    Comment: pain managment last used in early april   Sexual activity: Not Currently  Other Topics Concern   Not on file  Social History Narrative   Not on file   Social Determinants of Health   Financial Resource Strain: Not on file  Food Insecurity: Not on file  Transportation Needs: Not on file  Physical Activity: Not on file  Stress: Not on file  Social Connections: Not on file  Intimate Partner Violence: Not on file    Family History  Problem Relation Age of Onset   Heart disease Mother    Hypertension Mother  Cancer - Other Mother    Bipolar disorder Mother    Heart disease Father    Hypertension Father    Diabetes Father    Parkinson's disease Maternal Grandmother    Cancer Maternal Aunt    Cancer Maternal Uncle    Cancer Maternal Grandfather    Lupus Paternal Grandmother    Hypertension Brother      Current Outpatient Medications:    baclofen (LIORESAL) 20 MG tablet, Take 1 tablet (20 mg total) by mouth 3 (three) times daily as needed for muscle spasms., Disp: 90 each, Rfl: 2   benztropine (COGENTIN) 1 MG tablet, Take 1 tablet (1 mg total) by mouth daily as needed for tremors., Disp: 30 tablet, Rfl: 1   Dulaglutide (TRULICITY) 4.50 TU/8.8KC SOPN, Inject 0.75 mg into the skin once a week. (Patient taking differently: Inject 0.75 mg into the skin every Wednesday.), Disp: 3 mL, Rfl: 3   DULoxetine (CYMBALTA) 30 MG capsule, Take 1 capsule (30 mg total) by mouth 2 (two) times daily., Disp: 180 capsule, Rfl: 0   ergocalciferol (VITAMIN D2) 1.25 MG (50000 UT) capsule, Take 1 capsule by mouth once a week., Disp: , Rfl:    ezetimibe (ZETIA) 10 MG  tablet, Take 10 mg by mouth daily., Disp: , Rfl:    hydrOXYzine (VISTARIL) 25 MG capsule, Take by mouth., Disp: , Rfl:    icosapent Ethyl (VASCEPA) 1 g capsule, Take by mouth., Disp: , Rfl:    lisinopril (ZESTRIL) 20 MG tablet, Take 20 mg by mouth daily., Disp: , Rfl:    Magnesium 500 MG CAPS, Take by mouth., Disp: , Rfl:    Melatonin 10 MG TABS, Take 10 mg by mouth at bedtime as needed (sleep)., Disp: , Rfl:    metFORMIN (GLUCOPHAGE) 1000 MG tablet, Take 1 tablet (1,000 mg total) by mouth 2 (two) times daily with a meal., Disp: 180 tablet, Rfl: 3   mirabegron ER (MYRBETRIQ) 50 MG TB24 tablet, Take 1 tablet (50 mg total) by mouth daily., Disp: 90 tablet, Rfl: 3   Multiple Vitamin (MULTI-VITAMIN) tablet, Take 1 tablet by mouth daily., Disp: , Rfl:    ondansetron (ZOFRAN ODT) 4 MG disintegrating tablet, Take 1 tablet (4 mg total) by mouth every 8 (eight) hours as needed for nausea or vomiting., Disp: 20 tablet, Rfl: 0   oxyCODONE ER (XTAMPZA ER) 13.5 MG C12A, Take 1 tablet by mouth 2 (two) times daily., Disp: , Rfl:    PARoxetine (PAXIL) 10 MG tablet, Take 10 mg by mouth daily., Disp: , Rfl:    pregabalin (LYRICA) 200 MG capsule, Take 200 mg by mouth 2 (two) times daily., Disp: , Rfl:    Pyridoxine HCl (B-6 PO), Take by mouth., Disp: , Rfl:    QUEtiapine (SEROQUEL XR) 300 MG 24 hr tablet, Take 1 tablet (300 mg total) by mouth at bedtime., Disp: 90 tablet, Rfl: 0   QUEtiapine (SEROQUEL XR) 50 MG TB24 24 hr tablet, Take 1 tablet (50 mg total) by mouth at bedtime. Take along with 300 mg at bedtime, Disp: 90 tablet, Rfl: 0   tacrolimus (PROGRAF) 1 MG capsule, Take 1 capsule by mouth once daily (Patient taking differently: Take 1 mg by mouth 2 (two) times daily.), Disp: 30 capsule, Rfl: 0   VITAMIN D, CHOLECALCIFEROL, PO, Take by mouth., Disp: , Rfl:    glucose blood (COOL BLOOD GLUCOSE TEST STRIPS) test strip, Use as instructed (Patient not taking: No sig reported), Disp: 100 each, Rfl: 12  promethazine (PHENERGAN) 25 MG tablet, Take 1 tablet (25 mg total) by mouth as needed. (Patient not taking: No sig reported), Disp: 15 tablet, Rfl: 0  Current Facility-Administered Medications:    sulfamethoxazole-trimethoprim (BACTRIM DS) 800-160 MG per tablet 1 tablet, 1 tablet, Oral, Q12H, Hollice Espy, MD, 1 tablet at 01/17/21 1432  Physical exam:  Vitals:   04/30/21 1326  BP: 122/81  Pulse: 78  Resp: 18  Temp: 98.3 F (36.8 C)  SpO2: 96%  Weight: 253 lb 11.2 oz (115.1 kg)   Physical Exam Eyes:     Pupils: Pupils are equal, round, and reactive to light.  Cardiovascular:     Rate and Rhythm: Normal rate and regular rhythm.     Heart sounds: Normal heart sounds.  Pulmonary:     Effort: Pulmonary effort is normal.     Breath sounds: Normal breath sounds.  Abdominal:     General: Bowel sounds are normal.     Palpations: Abdomen is soft.  Musculoskeletal:     Cervical back: Normal range of motion.  Lymphadenopathy:     Comments: No palpable cervical adenopathy  Skin:    General: Skin is warm and dry.  Neurological:     Mental Status: She is alert and oriented to person, place, and time.     CMP Latest Ref Rng & Units 04/10/2021  Glucose 70 - 99 mg/dL 172(H)  BUN 6 - 20 mg/dL 19  Creatinine 0.44 - 1.00 mg/dL 1.06(H)  Sodium 135 - 145 mmol/L 134(L)  Potassium 3.5 - 5.1 mmol/L 4.4  Chloride 98 - 111 mmol/L 98  CO2 22 - 32 mmol/L 25  Calcium 8.9 - 10.3 mg/dL 9.3  Total Protein 6.5 - 8.1 g/dL 7.3  Total Bilirubin 0.3 - 1.2 mg/dL 0.8  Alkaline Phos 38 - 126 U/L 68  AST 15 - 41 U/L 65(H)  ALT 0 - 44 U/L 61(H)   CBC Latest Ref Rng & Units 04/10/2021  WBC 4.0 - 10.5 K/uL 4.6  Hemoglobin 12.0 - 15.0 g/dL 13.9  Hematocrit 36.0 - 46.0 % 42.3  Platelets 150 - 400 K/uL 125(L)    No images are attached to the encounter.  CT SOFT TISSUE NECK W CONTRAST  Result Date: 04/25/2021 CLINICAL DATA:  Lymphadenopathy. Left neck swelling. History of left parotid lymphoma. EXAM: CT  NECK WITH CONTRAST TECHNIQUE: Multidetector CT imaging of the neck was performed using the standard protocol following the bolus administration of intravenous contrast. CONTRAST:  62m OMNIPAQUE IOHEXOL 300 MG/ML  SOLN COMPARISON:  PET CT 07/08/2019 FINDINGS: Pharynx and larynx: No mass or thickening of Waldeyer's ring. Salivary glands: Diffuse parotid microlithiasis and heterogeneity attributed to chronic salivary disease. Thyroid: Normal Lymph nodes: Lymph node below the left parotid tail that matches prior ultrasound with maximal length of 13 mm, thin cortex and stability from prior PET-CT noncontrast CT. There is a prominent node in the right lateral retropharynx measuring 7 mm on 2:39, also stable from noncontrast CT. No heterogeneous nodes or new enlargement. Vascular: Notable calcified plaque at the right ICA bulb for age. Limited intracranial: Negative Visualized orbits: Negative Mastoids and visualized paranasal sinuses: Clear Skeleton: Negative Upper chest: Atelectasis or scarring in the upper lobes. Porta catheter on the left. IMPRESSION: 1. No evidence of recurrent disease. Stable cervical lymph nodes when compared to 2020 PET CT. 2. Notable for age calcified atheromatous plaque at the right ICA bulb, consider carotid Doppler for follow-up purposes. Electronically Signed   By: JNeva SeatD.  On: 04/25/2021 05:29   US Soft Tissue Head/Neck  Result Date: 04/06/2021 CLINICAL DATA:  Provided history: Extranodal marginal zone B-cell lymphoma of mucosa associated lymphoid tissue (MALT). Localized swelling, mass and lump, neck. Left neck swelling with history of lymphoma of left parotid. EXAM: ULTRASOUND OF HEAD/NECK SOFT TISSUES TECHNIQUE: Ultrasound examination of the head and neck soft tissues was performed in the area of clinical concern. COMPARISON:  Prior PET-CT examinations 07/08/2019 and earlier. Ultrasound of the bilateral parotid glands statin 05/27/2019. FINDINGS: Targeted ultrasound was  performed in the left parotid and left submandibular gland regions of concern. Limited imaging of the contralateral right parotid and right submandibular glands was also acquired for the purposes of comparison. Apparent nodular lesion at the inferior aspect of the left parotid gland measuring 1.3 x 0.8 x 1.2 cm, with heterogeneous echotexture and internal color Doppler flow. No second lesion is appreciated. These results will be called to the ordering clinician or representative by the Radiologist Assistant, and communication documented in the PACS or [employer]. IMPRESSION: Apparent 1.3 cm nodular lesion at the inferior aspect of the left parotid gland with internal color Doppler flow. Given the patient's history, this finding is suspicious for an abnormal lymph node or small mass. A contrast-enhanced neck CT should be considered for further evaluation. Electronically Signed   By: Kellie Simmering DO   On: 04/06/2021 14:55     Assessment and plan- Patient is a 45 y.o. female with prior history of diffuse large B-cell lymphoma and most recently a marginal zone lymphoma involving the parotid gland s/p 4 cycles of Rituxan in September 2020 Here to discuss CT scan results and further management  I have reviewed CT neck images independently and discussed findings with the patient which did not reveal any evidence of significant adenopathy or abnormality with parotid gland other than chronic salivary disease.  She also had a CT abdomen in April 2022 which did not show any abnormal adenopathy.  Mild splenomegaly was noted possibly secondary to cirrhosis and her thrombocytopenia is also secondary to that likely.  At this time I do not feel strongly about getting a PET CT scan.  Patient reports discomfort in the area of her left neck below the jawline but I do not palpate any mass in that area.  I would like her to see ENT for this before I consider a PET scan.  Patient was also noted to have M protein on her SPEP  when checked by neurology.  I will plan to see her back in 3 months with CBC with differential CMP myeloma panel and serum free light chains.  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 Encounter for follow-up surveillance of lymphoma      Dr. Randa Evens, MD, MPH [facility] at [facility] [phone] 04/30/2021 4:13 PM

## 2021-05-01 DIAGNOSIS — F4310 Post-traumatic stress disorder, unspecified: Secondary | ICD-10-CM | POA: Diagnosis not present

## 2021-05-01 DIAGNOSIS — F3181 Bipolar II disorder: Secondary | ICD-10-CM | POA: Diagnosis not present

## 2021-05-01 DIAGNOSIS — F411 Generalized anxiety disorder: Secondary | ICD-10-CM | POA: Diagnosis not present

## 2021-05-04 ENCOUNTER — Other Ambulatory Visit
Admission: RE | Admit: 2021-05-04 | Discharge: 2021-05-04 | Disposition: A | Discharge status: Home / Self Care | Payer: Medicaid Other | Source: Ambulatory Visit | Attending: Neurology | Admitting: Neurology

## 2021-05-04 DIAGNOSIS — G500 Trigeminal neuralgia: Secondary | ICD-10-CM | POA: Diagnosis not present

## 2021-05-04 DIAGNOSIS — Z79899 Other long term (current) drug therapy: Secondary | ICD-10-CM | POA: Diagnosis present

## 2021-05-04 DIAGNOSIS — F1190 Opioid use, unspecified, uncomplicated: Secondary | ICD-10-CM | POA: Insufficient documentation

## 2021-05-04 DIAGNOSIS — R278 Other lack of coordination: Secondary | ICD-10-CM | POA: Diagnosis not present

## 2021-05-04 DIAGNOSIS — G43119 Migraine with aura, intractable, without status migrainosus: Secondary | ICD-10-CM | POA: Diagnosis not present

## 2021-05-04 DIAGNOSIS — M797 Fibromyalgia: Secondary | ICD-10-CM | POA: Diagnosis not present

## 2021-05-04 LAB — AMMONIA: Ammonia: 19 umol/L (ref 9–35)

## 2021-05-09 ENCOUNTER — Telehealth (INDEPENDENT_AMBULATORY_CARE_PROVIDER_SITE_OTHER): Payer: Medicaid Other | Admitting: Psychiatry

## 2021-05-09 ENCOUNTER — Encounter: Payer: Self-pay | Admitting: Psychiatry

## 2021-05-09 ENCOUNTER — Other Ambulatory Visit: Payer: Self-pay

## 2021-05-09 DIAGNOSIS — F4310 Post-traumatic stress disorder, unspecified: Secondary | ICD-10-CM | POA: Diagnosis not present

## 2021-05-09 DIAGNOSIS — G2111 Neuroleptic induced parkinsonism: Secondary | ICD-10-CM | POA: Diagnosis not present

## 2021-05-09 DIAGNOSIS — F411 Generalized anxiety disorder: Secondary | ICD-10-CM | POA: Diagnosis not present

## 2021-05-09 DIAGNOSIS — F3161 Bipolar disorder, current episode mixed, mild: Secondary | ICD-10-CM | POA: Insufficient documentation

## 2021-05-09 MED ORDER — VENLAFAXINE HCL ER 37.5 MG PO CP24: 37.5000 mg | ORAL_CAPSULE | Freq: Every day | ORAL | 0 refills | Status: DC

## 2021-05-09 NOTE — Progress Notes (Signed)
Virtual Visit via Video Note  I connected with Alison [name] on 05/09/21 at  3:00 PM EDT by a video enabled telemedicine application and verified that I am speaking with the correct person using two identifiers.  Location Provider Location : [facility] Patient Location : Home  Participants: Patient , Provider   I discussed the limitations of evaluation and management by telemedicine and the availability of in person appointments. The patient expressed understanding and agreed to proceed.  I discussed the assessment and treatment plan with the patient. The patient was provided an opportunity to ask questions and all were answered. The patient agreed with the plan and demonstrated an understanding of the instructions.   The patient was advised to call back or seek an in-person evaluation if the symptoms worsen or if the condition fails to improve as anticipated.  Kimberly MD OP Progress Note  05/09/2021 5:28 PM Kaeden Depaz  MRN:  [id number]  Chief Complaint:  Chief Complaint   Follow-up; Anxiety; Depression    HPI: Alison [name] is a 45 year old Caucasian female, lives in [place], has a history of bipolar disorder, PTSD, multiple medical problems including fibromyalgia, chronic pain, trigeminal neuralgia, history of liver transplant, B-cell lymphoma, SLE, diabetes mellitus, OSA, migraine headache was evaluated by telemedicine today.  Patient recently diagnosed with asterixis-new onset leg buckling episodes, likely toxic metabolic in nature in patients with progressive liver disease-history of liver transplant, multiple pain medications.  Patient is currently under the care of her providers including neurologist-Dr. Manuella Ghazi.  Patient reports she currently feels more manic than depressed.  She went through an episode of taking up more projects at night, quilting and staying up for a few days.  She however reports she is coming out of it and last night she slept well.  She however does feel  anxious.  She feels nervous often, is worrying a lot, is restless, does have problems with her concentration due to her anxiety which has been getting worse since the past few days.  She reports she is worried about all her medical problems and recent diagnosis of asterixis and her liver function worsening.  She does report a history of worrying in the past however it has been getting worse since the past several months.  Patient reports her medications are currently being tapered down due to its effect on her liver.  That also worries her.  Patient does have a history of fibromyalgia and is currently on multiple medications including Cymbalta.  Patient denies any suicidality, homicidality or perceptual disturbances.  Patient denies any other concerns today.    Visit Diagnosis:    ICD-10-CM   1. Bipolar 1 disorder, mixed, mild (HCC)  F31.61 venlafaxine XR (EFFEXOR-XR) 37.5 MG 24 hr capsule   partial remission    2. PTSD (post-traumatic stress disorder)  F43.10 venlafaxine XR (EFFEXOR-XR) 37.5 MG 24 hr capsule    3. GAD (generalized anxiety disorder)  F41.1     4. 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   Chemo tx's   Migraine    Morbid obesity (HCC)     Neuromuscular disorder (HCC)    neuropathy   Neuropathy    Personality disorder ([place])    Post herpetic neuralgia    PTSD (post-traumatic stress disorder)    Renal disorder    S/P liver transplant [facility])     Past Surgical History:  Procedure Laterality Date   BONE MARROW BIOPSY  01/13/2015   BREAST BIOPSY  12/2014   BREAST BIOPSY  2011   BREAST SURGERY     CHOLECYSTECTOMY     COLONOSCOPY     ESOPHAGOGASTRODUODENOSCOPY     ESOPHAGOGASTRODUODENOSCOPY (EGD) WITH PROPOFOL N/A 01/09/2021   Procedure: ESOPHAGOGASTRODUODENOSCOPY (EGD) WITH PROPOFOL;  Surgeon: [name], MD;  Location: [facility] ENDOSCOPY;  Service: Endoscopy;  Laterality: N/A;   HERNIA REPAIR     infusaport     LIVER TRANSPLANT  12/17/1991   LUMBAR PUNCTURE     PORT A CATH INJECTION ([place] HX)     tumor removal  2015    Family Psychiatric History: Reviewed family psychiatric history from progress note on 06/10/2019  Family History:  Family History  Problem Relation Age of Onset   Heart disease Mother    Hypertension Mother    Cancer - Other Mother    Bipolar disorder Mother    Heart disease Father    Hypertension Father    Diabetes Father    Parkinson's disease Maternal Grandmother    Cancer Maternal Aunt    Cancer Maternal Uncle    Cancer Maternal Grandfather    Lupus Paternal Grandmother    Hypertension Brother     Social History: Reviewed social history from progress note on 06/10/2019 Social History   Socioeconomic History   Marital status: Single    Spouse name: Not on file   Number of children: 0   Years of education: 9   Highest education level: GED or equivalent  Occupational History   Occupation: disabled  Tobacco Use   Smoking status: Never   Smokeless tobacco: Never  Vaping Use   Vaping Use: Never used  Substance and Sexual Activity   Alcohol use: Not Currently   Drug use: Not Currently    Types: Marijuana    Comment: pain managment last used in early april   Sexual activity: Not  Currently  Other Topics Concern   Not on file  Social History Narrative   Not on file   Social Determinants of Health   Financial Resource Strain: Not on file  Food Insecurity: Not on file  Transportation Needs: Not on file  Physical Activity: Not on file  Stress: Not on file  Social Connections: Not on file    Allergies:  Allergies  Allergen Reactions   Penicillin G Itching, Rash, Anaphylaxis and Palpitations   Lamictal [Lamotrigine] Rash   Carbamazepine Other (See Comments)    Medication interaction-prograf   Hydrocodone-Acetaminophen Itching   Naproxen Itching    Metabolic Disorder Labs: Lab Results  Component Value Date   HGBA1C 8.9 (A) 12/11/2020   MPG 209 06/05/2020   Lab Results  Component Value Date   PROLACTIN 13.4 07/15/2019  PROLACTIN 21.3 05/24/2019   Lab Results  Component Value Date   CHOL 219 (H) 12/11/2020   TRIG 333 (H) 12/11/2020   HDL 38 (L) 12/11/2020   CHOLHDL 5.8 (H) 12/11/2020   LDLCALC 133 (H) 12/11/2020   LDLCALC 111 (H) 06/05/2020   Lab Results  Component Value Date   TSH 2.650 07/15/2019   TSH 4.650 (H) 05/24/2019    Therapeutic Level Labs: No results found for: LITHIUM No results found for: VALPROATE No components found for:  CBMZ  Current Medications: Current Outpatient Medications  Medication Sig Dispense Refill   baclofen (LIORESAL) 10 MG tablet Take by mouth.     venlafaxine XR (EFFEXOR-XR) 37.5 MG 24 hr capsule Take 1 capsule (37.5 mg total) by mouth daily with breakfast. 30 capsule 0   baclofen (LIORESAL) 20 MG tablet Take 1 tablet (20 mg total) by mouth 3 (three) times daily as needed for muscle spasms. 90 each 2   benztropine (COGENTIN) 1 MG tablet Take 1 tablet (1 mg total) by mouth daily as needed for tremors. 30 tablet 1   Dulaglutide (TRULICITY) 0.96 EA/5.4UJ SOPN Inject 0.75 mg into the skin once a week. (Patient taking differently: Inject 0.75 mg into the skin every Wednesday.) 3 mL 3   ergocalciferol  (VITAMIN D2) 1.25 MG (50000 UT) capsule Take 1 capsule by mouth once a week.     ezetimibe (ZETIA) 10 MG tablet Take 10 mg by mouth daily.     glucose blood (COOL BLOOD GLUCOSE TEST STRIPS) test strip Use as instructed (Patient not taking: No sig reported) 100 each 12   hydrOXYzine (VISTARIL) 25 MG capsule Take by mouth.     icosapent Ethyl (VASCEPA) 1 g capsule Take by mouth.     lisinopril (ZESTRIL) 20 MG tablet Take 20 mg by mouth daily.     Magnesium 500 MG CAPS Take by mouth.     Melatonin 10 MG TABS Take 10 mg by mouth at bedtime as needed (sleep).     metFORMIN (GLUCOPHAGE) 1000 MG tablet Take 1 tablet (1,000 mg total) by mouth 2 (two) times daily with a meal. 180 tablet 3   mirabegron ER (MYRBETRIQ) 50 MG TB24 tablet Take 1 tablet (50 mg total) by mouth daily. 90 tablet 3   Multiple Vitamin (MULTI-VITAMIN) tablet Take 1 tablet by mouth daily.     ondansetron (ZOFRAN ODT) 4 MG disintegrating tablet Take 1 tablet (4 mg total) by mouth every 8 (eight) hours as needed for nausea or vomiting. 20 tablet 0   oxyCODONE ER (XTAMPZA ER) 13.5 MG C12A Take 1 tablet by mouth 2 (two) times daily.     pregabalin (LYRICA) 200 MG capsule Take 200 mg by mouth 2 (two) times daily.     promethazine (PHENERGAN) 25 MG tablet Take 1 tablet (25 mg total) by mouth as needed. (Patient not taking: No sig reported) 15 tablet 0   Pyridoxine HCl (B-6 PO) Take by mouth.     QUEtiapine (SEROQUEL XR) 300 MG 24 hr tablet Take 1 tablet (300 mg total) by mouth at bedtime. 90 tablet 0   QUEtiapine (SEROQUEL XR) 50 MG TB24 24 hr tablet Take 1 tablet (50 mg total) by mouth at bedtime. Take along with 300 mg at bedtime 90 tablet 0   tacrolimus (PROGRAF) 1 MG capsule Take 1 capsule by mouth once daily (Patient taking differently: Take 1 mg by mouth 2 (two) times daily.) 30 capsule 0   VITAMIN D, CHOLECALCIFEROL, PO Take by mouth.  Current Facility-Administered Medications  Medication Dose Route Frequency Provider Last Rate  Last Admin   sulfamethoxazole-trimethoprim (BACTRIM DS) 800-160 MG per tablet 1 tablet  1 tablet Oral Q12H Hollice Espy, MD   1 tablet at 01/17/21 1432     Musculoskeletal: Strength & Muscle Tone:  UTA Gait & Station: normal Patient leans: N/A  Psychiatric Specialty Exam: Review of Systems  Musculoskeletal:  Positive for back pain.  Neurological:  Positive for tremors.  Psychiatric/Behavioral:  Positive for sleep disturbance. The patient is nervous/anxious.   All other systems reviewed and are negative.  Last menstrual period 02/04/2018.There is no height or weight on file to calculate BMI.  General Appearance: Casual  Eye Contact:  Fair  Speech:  Clear and Coherent  Volume:  Normal  Mood:  Anxious, hypomanic ( resolving)  Affect:  Congruent  Thought Process:  Goal Directed and Descriptions of Associations: Intact  Orientation:  Full (Time, Place, and Person)  Thought Content: Logical   Suicidal Thoughts:  No  Homicidal Thoughts:  No  Memory:  Immediate;   Fair Recent;   Fair Remote;   Fair  Judgement:  Fair  Insight:  Fair  Psychomotor Activity:  Normal  Concentration:  Concentration: Fair and Attention Span: Fair  Recall:  [employer] of Knowledge: Fair  Language: Fair  Akathisia:  No  Handed:  Right  AIMS (if indicated): not done  Assets:  Communication Skills Desire for Improvement Housing Social Support  ADL's:  Intact  Cognition: WNL  Sleep:  Poor   Screenings: GAD-7    Flowsheet Row Video Visit from 05/09/2021 in [place] Visit from 12/04/2020 in [facility]  Total GAD-7 Score 9 5      PHQ2-9    [place] Nutrition from 03/28/2021 in [place] Video Visit from 02/07/2021 in [place] Video Visit from 12/19/2020 in [place] Office Visit from 12/11/2020 in [facility] Office Visit from  12/04/2020 in [facility]  PHQ-2 Total Score _0 PHQ-9 Total Score 18 15 -- 5 17      Flowsheet Row Nutrition from 03/28/2021 in [place] ED from 01/18/2021 in [place] Admission (Discharged) from 01/09/2021 in [place] Error: Question 6 not populated No Risk No Risk        Assessment and Plan: Sabriyah Wilcher is a 45 year old Caucasian female on disability, has a history of bipolar disorder, PTSD, history of borderline personality disorder, chronic pain, fibromyalgia, trigeminal neuralgia, history of liver transplant, B-cell lymphoma, SLE, diabetes melitis, OSA, migraine headache, recent diagnosis of asterixis was evaluated by telemedicine today.  Patient is currently struggling with anxiety and will benefit from the following plan.  Plan Bipolar disorder mixed mild in partial remission Seroquel extended release 350 mg p.o. nightly Start Cymbalta 30 mg p.o. daily for the next 1 week and stop taking it due to her recent liver function abnormalities. Discussed sleep hygiene techniques  PTSD-improving Melatonin 5 to 10 mg manage nightly Hydroxyzine 25 mg p.o. 3 times daily as needed for anxiety attacks  GAD-unstable Start venlafaxine extended release 37.5 mg p.o. daily  Neuroleptic induced Parkinson's disease-stable Continue Cogentin 1 mg p.o. daily as needed  Reviewed and discussed labs-05/04/2021-CMP-glucose elevated at 162, creatinine 1.2-elevated, AST and ALT-elevated at 66/62,  Reviewed notes per Dr. Shah-neurologist-dated 05/04/2021-patient with asterixis-we will do  metabolic work-up.  MRI brain without contrast.  Follow-up in clinic in 3 weeks or sooner if needed.  This note was generated in part or whole with voice recognition software. Voice recognition is usually quite accurate but there are transcription errors that can and very  often do occur. I apologize for any typographical errors that were not detected and corrected.     [name], MD 05/10/2021, 12:11 PM

## 2021-05-10 ENCOUNTER — Ambulatory Visit: Payer: Medicaid Other | Admitting: Dietician

## 2021-05-15 ENCOUNTER — Other Ambulatory Visit: Payer: Self-pay

## 2021-05-15 ENCOUNTER — Other Ambulatory Visit: Payer: Self-pay | Admitting: Obstetrics and Gynecology

## 2021-05-15 NOTE — Patient Outreach (Signed)
Medicaid Managed Care   Nurse Care Manager Note  05/15/2021 Name:  Alison [name] MRN:  [id number] DOB:  [birth year]  Alison [name] is an 45 y.o. year old female who is a primary patient of [place].  The [facility] Managed Care Coordination team was consulted for assistance with:    Chronic healthcare management needs.  Alison [name] was given information about Medicaid Managed Care Coordination team services today. Alison [name] agreed to services and verbal consent obtained.  Engaged with patient by telephone for follow up visit in response to provider referral for case management and/or care coordination services.   Assessments/Interventions:  Review of past medical history, allergies, medications, health status, including review of consultants reports, laboratory and other test data, was performed as part of comprehensive evaluation and provision of chronic care management services.  SDOH (Social Determinants of Health) assessments and interventions performed: SDOH Interventions    Flowsheet Row Most Recent Value  SDOH Interventions   Food Insecurity Interventions Other (Comment)  [patient no onger having issues.]  Financial Strain Interventions Intervention Not Indicated  Housing Interventions Intervention Not Indicated  Intimate Partner Violence Interventions Intervention Not Indicated  Physical Activity Interventions Other (Comments)  [patient not physically able to exercise to a moderate level.]  Stress Interventions Other (Comment)  [sees therapist]  Social Connections Interventions Intervention Not Indicated  Transportation Interventions Other (Comment)  [patient given [employer] transportation phone number as a resource if needed.]       Care Plan  Allergies  Allergen Reactions   Penicillin G Itching, Rash, Anaphylaxis and Palpitations   Lamictal [Lamotrigine] Rash   Carbamazepine Other (See Comments)    Medication interaction-prograf    Hydrocodone-Acetaminophen Itching   Naproxen Itching    Medications Reviewed Today     Reviewed by [name], RN (Registered Nurse) on 05/15/21 at 1152  Med List Status: <None>   Medication Order Taking? Sig Documenting Provider Last Dose Status Informant  baclofen (LIORESAL) 10 MG tablet [id number] No Take by mouth.  Patient not taking: Reported on 05/15/2021   [provider] Not Taking Active   baclofen (LIORESAL) 20 MG tablet [id number]  Take 1 tablet (20 mg total) by mouth 3 (three) times daily as needed for muscle spasms.  Patient taking differently: Take 10 mg by mouth 2 (two) times daily.   [name], FNP  Active Self  benztropine (COGENTIN) 1 MG tablet [id number]  Take 1 tablet (1 mg total) by mouth daily as needed for tremors. [name], MD  Active Self  Dulaglutide (TRULICITY) 9.16 BW/4.6KZ SOPN [id number]  Inject 0.75 mg into the skin once a week.  Patient taking differently: Inject 0.75 mg into the skin every Wednesday.   Myles Gip, DO  Active   ergocalciferol (VITAMIN D2) 1.25 MG (50000 UT) capsule [id number]  Take 1 capsule by mouth once a week. [provider]  Active   ezetimibe (ZETIA) 10 MG tablet [id number]  Take 10 mg by mouth daily. [provider]  Active   glucose blood (COOL BLOOD GLUCOSE TEST STRIPS) test strip [id number]  Use as instructed  Patient not taking: No sig reported   Steele Sizer, MD  Active   hydrOXYzine (VISTARIL) 25 MG capsule [id number]  Take by mouth. [provider]  Active   icosapent Ethyl (VASCEPA) 1 g capsule [id number]  Take by mouth. [provider]  Active   lisinopril (ZESTRIL) 20 MG tablet [id number]  Take 20 mg by mouth daily. [provider]  Active   Magnesium 500 MG CAPS [id number]  Take by mouth. [provider]  Active   Melatonin 10 MG TABS [id number]  Take 10 mg by mouth at bedtime as needed (sleep). [provider]  Active Self  metFORMIN  (GLUCOPHAGE) 1000 MG tablet [id number]  Take 1 tablet (1,000 mg total) by mouth 2 (two) times daily with a meal. Myles Gip, DO  Active Self  mirabegron ER (MYRBETRIQ) 50 MG TB24 tablet [id number]  Take 1 tablet (50 mg total) by mouth daily. Debroah Loop, PA-C  Active Self  Multiple Vitamin (MULTI-VITAMIN) tablet [id number]  Take 1 tablet by mouth daily. [provider]  Active Self  ondansetron (ZOFRAN ODT) 4 MG disintegrating tablet [id number]  Take 1 tablet (4 mg total) by mouth every 8 (eight) hours as needed for nausea or vomiting. Paulette Blanch, MD  Active   oxyCODONE ER [facility] ER) 13.5 MG C12A [id number]  Take 1 tablet by mouth 2 (two) times daily. [provider]  Active   pregabalin (LYRICA) 200 MG capsule [id number]  Take 200 mg by mouth 2 (two) times daily. [provider]  Active   promethazine (PHENERGAN) 25 MG tablet [id number]  Take 1 tablet (25 mg total) by mouth as needed.  Patient not taking: No sig reported   Myles Gip, DO  Active Self  Pyridoxine HCl (B-6 PO) [id number]  Take by mouth. [provider]  Active   QUEtiapine (SEROQUEL XR) 300 MG 24 hr tablet [id number]  Take 1 tablet (300 mg total) by mouth at bedtime. [name], MD  Active   QUEtiapine (SEROQUEL XR) 50 MG TB24 24 hr tablet [id number]  Take 1 tablet (50 mg total) by mouth at bedtime. Take along with 300 mg at bedtime [name], MD  Active   sulfamethoxazole-trimethoprim (BACTRIM DS) 800-160 MG per tablet 1 tablet [id number]   Hollice Espy, MD  Active   tacrolimus (PROGRAF) 1 MG capsule [id number]  Take 1 capsule by mouth once daily  Patient taking differently: Take 1 mg by mouth 2 (two) times daily.   [name], FNP  Active            Med Note [name], [name]   Fri Jan 19, 2021  5:01 AM)    venlafaxine XR (EFFEXOR-XR) 37.5 MG 24 hr capsule [id number]  Take 1 capsule (37.5 mg total) by mouth daily with breakfast. [name], MD  Active    VITAMIN D, CHOLECALCIFEROL, PO [id number]  Take by mouth. 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   Edema of lower extremity 09/16/2019   Carpal tunnel syndrome, left 07/26/2019   Cubital tunnel syndrome on left 07/26/2019   Bipolar I disorder, most recent episode depressed ([place]) 07/12/2019   PTSD (post-traumatic stress disorder) 67/54/4920   Umbilical hernia without obstruction and without gangrene 06/17/2019   Encounter for surveillance of abnormal nevi 06/17/2019   Pineal gland cyst 06/17/2019   Chronic hip  pain, left 06/02/2019   Lumbar spondylosis 06/02/2019   Mouth dryness 06/02/2019   Obesity (BMI 35.0-39.9 without comorbidity) 06/02/2019   Goals of care, counseling/discussion 05/18/2019   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[place]) 05/18/2019   Occipital neuralgia 05/13/2019   Plantar fasciitis of left foot 04/27/2019   Fibromyalgia 03/23/2019   Systemic lupus erythematosus (SLE) in adult [facility]) 03/23/2019   Essential hypertension 03/23/2019   Hepatitis 03/23/2019   Mass of left kidney 03/23/2019   Diabetes mellitus ([place]) 03/23/2019   Nausea without vomiting 03/23/2019   Neuropathy 03/23/2019   Cancer ([place]) 03/23/2019   Hyperlipidemia 03/23/2019   Osteoarthritis 03/23/2019   Trigeminal neuralgia 03/23/2019   Chronic renal disease, stage  III ([place]) 03/23/2019   Nephrolithiasis 03/23/2019   Migraine 03/23/2019   OSA on CPAP 03/23/2019   Fibrocystic breast 03/23/2019   Heart murmur 03/23/2019   Bipolar disorder, in partial remission, most recent episode depressed ([place]) 07/02/2017   Abnormal uterine bleeding 03/18/2017   Major depressive disorder, recurrent ([place]) 03/18/2017   Morbid obesity ([place]) 03/18/2017   Secondary hyperparathyroidism ([place]) 02/07/2017   Neuropathic pain 11/19/2016   Post herpetic neuralgia 11/19/2016   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  Care Plan : Chronic Pain (Adult)  Updates made by [name], RN since 05/15/2021 12:00 AM     Problem: Chronic Pain Management-fibromyalgia   Priority: High  Onset Date: 01/22/2021     Long-Range Goal: Fibromyalgia pain managed-new pain management provider   Start Date: 08/14/2020  Expected End Date: 08/15/2021  Recent Progress: On track  Priority: High  Note:   Current Barriers:  Care Coordination needs related to pain management provider.   Patient is experiencing fibromyalgia flair and is interested in help being connected to a pain management provider. Unable to independently secure referral or appointment for pain management provider. Update 11/24/20:  Patient is being followed by Dr. Humphrey Rolls.  Nurse Case Manager Clinical Goal(s):  Over the next 45 days, patient will work with [facility] to  address needs related to referral for pain management provider and associated care coordination needs. Update 09/22/20:  patient is currently seeing Dr. Humphrey Rolls at [facility].  Interventions:  Inter-disciplinary care team collaboration (see longitudinal plan of care) Evaluation of current treatment plan related to fibromyalgia  and patient's adherence to plan as established by provider. Update 02/21/21:  Patient has restarted Cymbalta. Update 04/19/21:  No complaints today. Update 05/15/21:  Patient's Cymbalta and Baclofen stopped by provider, patient will contact provider for alternative as she is having increased symptoms. Collaborated with primary care provider regarding recommendations and referral to pain management provider. Discussed plans with patient for ongoing care management follow up and provided patient with direct contact information for care management team Anticipate pain education program, pain management support as part of pain management referral.      [name] contacted patient to check in. Patient stated she has  not contacted an eye doctor nor has she seen a specialist. Patient states she thinks she has COVID due to her husband having it. She has not taken a test yet, but has some symptoms. She is isolating in the bedroom and they have someone to cook and bring her food.       Update 11/16/20: [name] completed phone call with patient, she states she is still having symptoms from [place] and has been in bed all month. Patient states she has not had a chance to contact an eye doctor or specialist due to being sick. Update 11/24/20:  Patient with continued fatigue-will follow up with PCP. Update 01/22/21:  patient continues to follow up with provider. Pharmacy referral for medication review. Update 01/22/21:  Patient met with Pharmacist-continuing to follow. Update 12/14/20: [name] completed call with patient, she stated she is waiting on a call from [facility] about getting a liver  biopsy completed. She has not reached out to the eye doctor yet because her liver is top priority right now. Update 12/22/20:  Patient seen by PCP 12/11/20.  Has not seen Opthamologist yet.  Had liver biopsy 12/20/20 due to increased liver enzymes.  Update 01/15/21: [name] completed a follow up with patient. She stated she found out she has to have a liver transplant and is currently completing testing. Patient stated that she has been in pain since her pcp took her off of her pain medication. Patient stated her rent will be going up by $400 and they have to move. They will most likely move to /[place] area because her fiance' works in that area. No resources needed at this time. Update 03/20/21:  Pain states no pain at this time.  Has decided to stay in current apartment.  Patient states her liver function tests have improved and she is stable right now. Update 05/15/21:  Encouraged patient to schedule appointment with Ophthalmologist-patient states she will do this.  Patient Goals/Self-Care Activities Over the next 45 days, patient will:  -Attends all scheduled provider appointments  develop a personal pain management plan with your pain management provider.  Follow Up Plan:  The Managed Medicaid care management team will reach out to the patient again over the next 30 days.     Care Plan : Wellness (Adult)  Updates made by [name], RN since 05/15/2021 12:00 AM     Problem: Medication Adherence (Wellness)   Priority: High  Onset Date: 01/22/2021     Long-Range Goal: Medication Adherence Maintained   Start Date: 09/06/2020  Expected End Date: 08/15/2021  Recent Progress: Not on track  Priority: High  Note:   Current Barriers:  Patient states she needs to have prescriptions refilled and feels she has some side effects from her medications. Patient with only 1 vehicle now and having trouble attending appointments-has cancelled 2 appointments recently. Patient with new neck lump-has CT  scheduled 04/24/21, ? Lymphoma. Update 12/22/20:  Patient's PCP discontinued Victoza and added Trulicity.  Patient has not been taking mediations as prescribed.  Hgb A1C 8.1 on 12/11/20. Update 01/22/21:  Patient states she is taking medications.  She checks blood sugar once a day and needs more testing strips.  Blood sugars 200's per patient. Update 02/21/21:  Patient is checking blood sugars 4 times a day.  Has appointment with endocrinologist today and is keeping food diary. Update 03/20/21:  Blood sugars mid 100's perpatient-has appointment with nutritionist 03/28/21. Update 04/20/21:  Blood sugars 220-300 per patient-states she saw Provider and Nutritionist-is working  on reducing carbohydrates. Update 05/15/21:  Patient states her blood sugars have been between 120 and 140-she has not checked in a couple of days.  Has upcoming appointment with nutritionist for follow up.  Nurse Case Manager Clinical Goal(s):  Over the next 30 days, patient will work with CM team pharmacist to review current medications. Update 01/22/21:  Patient met with Pharmacist and continues to follow. Over the next 30 days, patient will meet with Nutritionist and check her blood sugars. Over the next 30 days, patient will attend all scheduled appointments.  Interventions:  Inter-disciplinary care team collaboration (see longitudinal plan of care) Evaluation of current treatment plan and patient's adherence to plan as established by provider. Advised patient to contact her PCP for any medication needs. Reviewed medications with patient. Collaborated with pharmacy regarding medications.  Discussed plans with patient for ongoing care management follow up and provided patient with direct contact information for care management team Pharmacy referral for medication review. Patient given phone number for [facility] transportation if needed. Will notify PCP of need for testing strips and dietician referral. Update  02/21/21:  Patient has appointment with nutritionist 03/28/21 and has all testing supplies needed.  Patient Goals/Self-Care Activities Over the next 30 days, patient will:  -Patient will take medications as prescribed. Calls pharmacy for medication refills Calls provider office for new concerns or questions  Follow Up Plan: The Managed Medicaid care management team will reach out to the patient again over the next 30 days.  The patient has been provided with contact information for the Managed Medicaid care management team and has been advised to call with any health related questions or concerns.     Follow Up:  Patient agrees to Care Plan and Follow-up.  Plan: The Managed Medicaid care management team will reach out to the patient again over the next 30 days. and The patient has been provided with contact information for the Managed Medicaid care management team and has been advised to call with any health related questions or concerns.  Date/time of next scheduled RN care management/care coordination outreach: 06/15/21 at 0930.

## 2021-05-15 NOTE — Patient Instructions (Addendum)
Hi Alison [name], thank you for speaking with me today-I hope you feel better.  Alison [name] was given information about Medicaid Managed Care team care coordination services as a part of their Healthy [facility] Medicaid benefit. Alison [name] verbally consented to engagement with the [facility] Managed Care team.   If you are experiencing a medical emergency, please call 911 or report to your local emergency department or urgent care.   If you have a non-emergency medical problem during routine business hours, please contact your provider's office and ask to speak with a nurse.   For questions related to your Healthy [facility] health plan, please call: [phone] or visit the homepage here: [email or web address]  If you would like to schedule transportation through your Healthy [facility] plan, please call the following number at least 2 days in advance of your appointment: [phone]  Call the Central at [phone], at any time, 24 hours a day, 7 days a week. If you are in danger or need immediate medical attention call 911.  If you would like help to quit smoking, call 1-800-QUIT-NOW (201) 312-7708) OR Espaol: 1-855-Djelo-Ya (3-567-014-1030) o para ms informacin haga clic aqu or Text READY to 200-400 to register via text  Alison [name] - following are the goals we discussed in your visit today:   Goals Addressed             This Visit's Progress    Chronic Pain Managed        Update 01/22/21:  Patient sees Dr. Humphrey Rolls for chronic pain management. Update 02/21/21:  Patient is restarting Cymbalta. Update 03/20/21:  Chronic pain stable per patient-no complaints today-recently saw Dr. Humphrey Rolls Update 04/19/21:  No complaints today Update 05/15/21:  Cymbalta and Baclofen stopped by provider-patient experiencing more symptoms-will contact provider.  Evidence-based guidance:  Address common beliefs about pain, such as pain is to be  endured, a normal part of aging or that it is not "real"; feelings of resignation that nothing can be done and that complaining will be a sign of weakness.  Assess pain level, treatment efficacy and patient response at regular intervals using a consistent pain scale.  Assess pain using self-report (most reliable), family/caregiver report, validated pain scale; consider impact on quality of life.  Determine if pain is associated with mobility or at rest, location, intensity, frequency, duration, recurrence, pattern and description (e.g., cramping, burning, aching), triggers and relieving factors.   Anticipate referral to pain education program, pain management support or community resources for specific diagnoses (e.g., cancer, fibromyalgia, multiple sclerosis).  Explore fears associated with anticipated or imagined pain; encourage acceptance-based approaches.  Encourage exposure to experiences previously avoided due to fear of pain.  Anticipate referral to pain management specialist, physical therapist, addiction specialist (if history of substance use), psychotherapist; advocate for consultation with pharmacist.  Initiate nonpharmacologic measures, such as cognitive behavior therapy, mindfulness, guided imagery, massage, distraction, relaxation, chiropractic manipulation, dietary supplements or acupuncture.  Provide multimodal treatment interventions, such as physical activity, therapeutic exercise, yoga, TENS (transcutaneous electrical nerve stimulation) and manual therapy.  Train in functional activity modifications, such as body mechanics, posture, ergonomics, energy conservation and activity pacing.  Encourage use of local anesthetic or analgesic therapy as an adjunct for pain control (e.g., lidocaine patch, capsaicin cream, topical nonsteroidal anti-inflammatory drugs).  Prepare patient for use of pharmacologic therapy in a stepped approach that may include acetaminophen, nonsteroidal  anti-inflammatory drugs, opioid, antiepileptic, antidepressant or nonbenzodiazepine muscle relaxant.  Review efficacy, tolerability, adherence and manage medication-induced side  effects.       Patient verbalizes understanding of instructions provided today.   The Managed Medicaid care management team will reach out to the patient again over the next 30 days.  The  Patient                                              has been provided with contact information for the Managed Medicaid care management team and has been advised to call with any health related questions or concerns.   Alison Raider RN, [name] [place]  [facility] - Managed Medicaid High Risk [phone].  Following is a copy of your plan of care:  Patient Care Plan: Chronic Pain (Adult)     Problem Identified: Chronic Pain Management-fibromyalgia   Priority: High  Onset Date: 01/22/2021     Long-Range Goal: Fibromyalgia pain managed-new pain management provider   Start Date: 08/14/2020  Expected End Date: 08/15/2021  Recent Progress: On track  Priority: High  Note:   Current Barriers:  Care Coordination needs related to pain management provider.   Patient is experiencing fibromyalgia flair and is interested in help being connected to a pain management provider. Unable to independently secure referral or appointment for pain management provider. Update 11/24/20:  Patient is being followed by Dr. Humphrey Rolls.  Nurse Case Manager Clinical Goal(s):  Over the next 45 days, patient will work with [facility] to address needs related to referral for pain management provider and associated care coordination needs. Update 09/22/20:  patient is currently seeing Dr. Humphrey Rolls at [facility].  Interventions:  Inter-disciplinary care team collaboration (see longitudinal plan of care) Evaluation of current treatment plan related to fibromyalgia  and patient's adherence to plan as  established by provider. Update 02/21/21:  Patient has restarted Cymbalta. Update 04/19/21:  No complaints today. Update 05/15/21:  Patient's Cymbalta and Baclofen stopped by provider, patient will contact provider for alternative as she is having increased symptoms. Collaborated with primary care provider regarding recommendations and referral to pain management provider. Discussed plans with patient for ongoing care management follow up and provided patient with direct contact information for care management team Anticipate pain education program, pain management support as part of pain management referral.      [name] contacted patient to check in. Patient stated she has not contacted an eye doctor nor has she seen a specialist. Patient states she thinks she has COVID due to her husband having it. She has not taken a test yet, but has some symptoms. She is isolating in the bedroom and they have someone to cook and bring her food.       Update 11/16/20: [name] completed phone call with patient, she states she is still having symptoms from [place] and has been in bed all month. Patient states she has not had a chance to contact an eye doctor or specialist due to being sick. Update 11/24/20:  Patient with continued fatigue-will follow up with PCP. Update 01/22/21:  patient continues to follow up with provider. Pharmacy referral for medication review. Update 01/22/21:  Patient met with Pharmacist-continuing to follow. Update 12/14/20: [name] completed call with patient, she stated she is waiting on a call from [facility] about getting a liver biopsy completed. She has not reached out to the eye doctor yet because her liver is top priority right now.  Update 12/22/20:  Patient seen by PCP 12/11/20.  Has not seen Opthamologist yet.  Had liver biopsy 12/20/20 due to increased liver enzymes.  Update 01/15/21: [name] completed a follow up with patient. She stated she found out she has to have a liver transplant and is currently completing  testing. Patient stated that she has been in pain since her pcp took her off of her pain medication. Patient stated her rent will be going up by $400 and they have to move. They will most likely move to [place] area because her fiance' works in that area. No resources needed at this time. Update 03/20/21:  Pain states no pain at this time.  Has decided to stay in current apartment.  Patient states her liver function tests have improved and she is stable right now. Update 05/15/21:  Encouraged patient to schedule appointment with Ophthalmologist-patient states she will do this.  Patient Goals/Self-Care Activities Over the next 45 days, patient will:  -Attends all scheduled provider appointments  develop a personal pain management plan with your pain management provider.  Follow Up Plan:  The Managed Medicaid care management team will reach out to the patient again over the next 30 days.     Patient Care Plan: Wellness (Adult)     Problem Identified: Medication Adherence (Wellness)   Priority: High  Onset Date: 01/22/2021     Long-Range Goal: Medication Adherence Maintained   Start Date: 09/06/2020  Expected End Date: 08/15/2021  Recent Progress: Not on track  Priority: High  Note:   Current Barriers:  Patient states she needs to have prescriptions refilled and feels she has some side effects from her medications. Patient with only 1 vehicle now and having trouble attending appointments-has cancelled 2 appointments recently. Patient with new neck lump-has CT scheduled 04/24/21, ? Lymphoma. Update 12/22/20:  Patient's PCP discontinued Victoza and added Trulicity.  Patient has not been taking mediations as prescribed.  Hgb A1C 8.1 on 12/11/20. Update 01/22/21:  Patient states she is taking medications.  She checks blood sugar once a day and needs more testing strips.  Blood sugars 200's per patient. Update 02/21/21:  Patient is checking blood sugars 4 times a day.  Has appointment with  endocrinologist today and is keeping food diary. Update 03/20/21:  Blood sugars mid 100's perpatient-has appointment with nutritionist 03/28/21. Update 04/20/21:  Blood sugars 220-300 per patient-states she saw Provider and Nutritionist-is working on reducing carbohydrates. Update 05/15/21:  Patient states her blood sugars have been between 120 and 140-she has not checked in a couple of days.  Has upcoming appointment with nutritionist for follow up, 06/29/21.  Nurse Case Manager Clinical Goal(s):  Over the next 30 days, patient will work with CM team pharmacist to review current medications. Update 01/22/21:  Patient met with Pharmacist and continues to follow. Over the next 30 days, patient will meet with Nutritionist and check her blood sugars. Over the next 30 days, patient will attend all scheduled appointments.  Interventions:  Inter-disciplinary care team collaboration (see longitudinal plan of care) Evaluation of current treatment plan and patient's adherence to plan as established by provider. Advised patient to contact her PCP for any medication needs. Reviewed medications with patient. Collaborated with pharmacy regarding medications.  Discussed plans with patient for ongoing care management follow up and provided patient with direct contact information for care management team Pharmacy referral for medication review. Patient given phone number for [facility] transportation if needed. Will notify PCP of need for testing strips  and dietician referral. Update 02/21/21:  Patient has appointment with nutritionist 03/28/21 and has all testing supplies needed. Update 05/15/21:  Next appointment is 06/29/21 with Nutritionist.  Patient Goals/Self-Care Activities Over the next 30 days, patient will:  -Patient will take medications as prescribed. Calls pharmacy for medication refills Calls provider office for new concerns or questions  Follow Up Plan: The Managed Medicaid care  management team will reach out to the patient again over the next 30 days.  The patient has been provided with contact information for the Managed Medicaid care management team and has been advised to call with any health related questions or concerns.

## 2021-05-16 DIAGNOSIS — F4310 Post-traumatic stress disorder, unspecified: Secondary | ICD-10-CM | POA: Diagnosis not present

## 2021-05-16 DIAGNOSIS — F3181 Bipolar II disorder: Secondary | ICD-10-CM | POA: Diagnosis not present

## 2021-05-16 DIAGNOSIS — F411 Generalized anxiety disorder: Secondary | ICD-10-CM | POA: Diagnosis not present

## 2021-05-22 ENCOUNTER — Other Ambulatory Visit: Payer: Self-pay | Admitting: Neurology

## 2021-05-22 DIAGNOSIS — R278 Other lack of coordination: Secondary | ICD-10-CM

## 2021-05-28 ENCOUNTER — Encounter: Payer: Self-pay | Admitting: Psychiatry

## 2021-05-28 ENCOUNTER — Other Ambulatory Visit: Payer: Self-pay

## 2021-05-28 ENCOUNTER — Telehealth (INDEPENDENT_AMBULATORY_CARE_PROVIDER_SITE_OTHER): Payer: Medicaid Other | Admitting: Psychiatry

## 2021-05-28 DIAGNOSIS — G2111 Neuroleptic induced parkinsonism: Secondary | ICD-10-CM | POA: Diagnosis not present

## 2021-05-28 DIAGNOSIS — F411 Generalized anxiety disorder: Secondary | ICD-10-CM | POA: Diagnosis not present

## 2021-05-28 DIAGNOSIS — F3177 Bipolar disorder, in partial remission, most recent episode mixed: Secondary | ICD-10-CM

## 2021-05-28 DIAGNOSIS — F4310 Post-traumatic stress disorder, unspecified: Secondary | ICD-10-CM

## 2021-05-28 DIAGNOSIS — F3161 Bipolar disorder, current episode mixed, mild: Secondary | ICD-10-CM

## 2021-05-28 MED ORDER — VENLAFAXINE HCL ER 37.5 MG PO CP24: 37.5000 mg | ORAL_CAPSULE | Freq: Every day | ORAL | 0 refills | Status: DC

## 2021-05-28 MED ORDER — QUETIAPINE FUMARATE ER 300 MG PO TB24: 300.0000 mg | ORAL_TABLET | Freq: Every day | ORAL | 0 refills | Status: DC

## 2021-05-28 NOTE — Progress Notes (Signed)
Virtual Visit via Video Note  I connected with Alison [name] on 05/28/21 at  2:40 PM EDT by a video enabled telemedicine application and verified that I am speaking with the correct person using two identifiers.  Location Provider Location : [facility] Patient Location : Home  Participants: Patient , Provider   I discussed the limitations of evaluation and management by telemedicine and the availability of in person appointments. The patient expressed understanding and agreed to proceed.   I discussed the assessment and treatment plan with the patient. The patient was provided an opportunity to ask questions and all were answered. The patient agreed with the plan and demonstrated an understanding of the instructions.   The patient was advised to call back or seek an in-person evaluation if the symptoms worsen or if the condition fails to improve as anticipated.   [place] MD OP Progress Note  05/29/2021 4:31 PM Alison [name]  MRN:  [id number]  Chief Complaint:  Chief Complaint   Follow-up; Anxiety; Depression    HPI: Alison [name] is a 45 year old Caucasian female, lives in [place], has a history of bipolar disorder, PTSD, multiple medical problems including fibromyalgia, chronic pain, trigeminal neuralgia, history of liver transplant, B-cell lymphoma, SLE, diabetes melitis, OSA, migraine headache was evaluated by telemedicine today.  Patient reports she no longer has manic symptoms anymore.  She denies any significant sadness however reports she feels unmotivated to do activities, chores.  She however has been struggling with liver disease, possible hepatic encephalopathy, unknown if this is also contributing to her sluggishness.  She also currently reports facial pain of her right side from possible trigeminal neuralgia.  Is currently using lidocaine for the same with some relief.  She agrees to get in touch with her primary neurologist.  She reports she looks forward to going for a trip  with her friends, going away to a convention in [place].  She is excited about the same.  She is compliant on medications, she was able to get a pillbox which has been beneficial.  She is also trying to get a pillbox with an alarm on it which will help her better.  She denies any suicidality, homicidality or perceptual disturbances.  Patient denies any other concerns today.  Visit Diagnosis:    ICD-10-CM   1. Bipolar disorder, in partial remission, most recent episode mixed (HCC)  F31.77     2. PTSD (post-traumatic stress disorder)  F43.10 QUEtiapine (SEROQUEL XR) 300 MG 24 hr tablet    venlafaxine XR (EFFEXOR-XR) 37.5 MG 24 hr capsule    3. GAD (generalized anxiety disorder)  F41.1     4. 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   Chemo tx's   Migraine    Morbid obesity (HCC)    Neuromuscular disorder (HCC)    neuropathy   Neuropathy    Personality disorder (HCC)    Post herpetic neuralgia    PTSD (post-traumatic stress disorder)    Renal disorder    S/P liver transplant [facility])     Past Surgical History:  Procedure Laterality Date    BONE MARROW BIOPSY  01/13/2015   BREAST BIOPSY  12/2014   BREAST BIOPSY  2011   BREAST SURGERY     CHOLECYSTECTOMY     COLONOSCOPY     ESOPHAGOGASTRODUODENOSCOPY     ESOPHAGOGASTRODUODENOSCOPY (EGD) WITH PROPOFOL N/A 01/09/2021   Procedure: ESOPHAGOGASTRODUODENOSCOPY (EGD) WITH PROPOFOL;  Surgeon: [name], MD;  Location: [facility] ENDOSCOPY;  Service: Endoscopy;  Laterality: N/A;   HERNIA REPAIR     infusaport     LIVER TRANSPLANT  12/17/1991   LUMBAR PUNCTURE     PORT A CATH INJECTION ([place] [facility])     tumor removal  2015    Family Psychiatric History: Reviewed family psychiatric history from progress note on 06/10/2019  Family History:  Family History  Problem Relation Age of Onset   Heart disease Mother    Hypertension Mother    Cancer - Other Mother    Bipolar disorder Mother    Heart disease Father    Hypertension Father    Diabetes Father    Parkinson's disease Maternal Grandmother    Cancer Maternal Aunt    Cancer Maternal Uncle    Cancer Maternal Grandfather    Lupus Paternal Grandmother    Hypertension Brother     Social History: Reviewed social history from progress note on 06/10/2019 Social History   Socioeconomic History   Marital status: Single    Spouse name: Not on file   Number of children: 0   Years of education: 9   Highest education level: GED or equivalent  Occupational History   Occupation: disabled  Tobacco Use   Smoking status: Never   Smokeless tobacco: Never  Vaping Use   Vaping Use: Never used  Substance and Sexual Activity   Alcohol use: Not Currently   Drug use: Not Currently    Types: Marijuana    Comment: pain managment last used in early april   Sexual activity: Not Currently  Other Topics Concern   Not on file  Social History Narrative   Not on file   Social Determinants of Health   Financial Resource Strain: Medium Risk   Difficulty of Paying Living Expenses: Somewhat hard  Food Insecurity: Landscape architect Present   Worried  About Charity fundraiser in the Last Year: Sometimes true   Arboriculturist in the Last Year: Sometimes true  Transportation Needs: Unmet Transportation Needs   Lack of Transportation (Medical): Yes   Lack of Transportation (Non-Medical): Yes  Physical Activity: Insufficiently Active   Days of Exercise per Week: 1 day   Minutes of Exercise per Session: 30 min  Stress: Stress Concern Present   Feeling of Stress : Rather much  Social Connections: Moderately Integrated   Frequency of Communication with Friends and Family: More than three times a week   Frequency of Social Gatherings with Friends and Family: More than three times a week   Attends Religious Services: More than  4 times per year   Active Member of Clubs or Organizations: No   Attends Archivist Meetings: Never   Marital Status: Living with partner    Allergies:  Allergies  Allergen Reactions   Penicillin G Itching, Rash, Anaphylaxis and Palpitations   Lamictal [Lamotrigine] Rash   Carbamazepine Other (See Comments)    Medication interaction-prograf   Hydrocodone-Acetaminophen Itching   Naproxen Itching    Metabolic Disorder Labs: Lab Results  Component Value Date   HGBA1C 8.9 (A) 12/11/2020   MPG 209 06/05/2020   Lab Results  Component Value Date   PROLACTIN 13.4 07/15/2019   PROLACTIN 21.3 05/24/2019   Lab Results  Component Value Date   CHOL 219 (H) 12/11/2020   TRIG 333 (H) 12/11/2020   HDL 38 (L) 12/11/2020   CHOLHDL 5.8 (H) 12/11/2020   LDLCALC 133 (H) 12/11/2020   LDLCALC 111 (H) 06/05/2020   Lab Results  Component Value Date   TSH 2.650 07/15/2019   TSH 4.650 (H) 05/24/2019    Therapeutic Level Labs: No results found for: LITHIUM No results found for: VALPROATE No components found for:  CBMZ  Current Medications: Current Outpatient Medications  Medication Sig Dispense Refill   baclofen (LIORESAL) 10 MG tablet Take by mouth.     baclofen (LIORESAL) 20 MG tablet Take 1 tablet  (20 mg total) by mouth 3 (three) times daily as needed for muscle spasms. (Patient taking differently: Take 10 mg by mouth 2 (two) times daily.) 90 each 2   benztropine (COGENTIN) 1 MG tablet Take 1 tablet (1 mg total) by mouth daily as needed for tremors. 30 tablet 1   Dulaglutide (TRULICITY) 5.83 EN/4.0HW SOPN Inject 0.75 mg into the skin once a week. (Patient taking differently: Inject 0.75 mg into the skin every Wednesday.) 3 mL 3   ergocalciferol (VITAMIN D2) 1.25 MG (50000 UT) capsule Take 1 capsule by mouth once a week.     ezetimibe (ZETIA) 10 MG tablet Take 10 mg by mouth daily.     glucose blood (COOL BLOOD GLUCOSE TEST STRIPS) test strip Use as instructed (Patient not taking: No sig reported) 100 each 12   hydrOXYzine (VISTARIL) 25 MG capsule Take by mouth.     icosapent Ethyl (VASCEPA) 1 g capsule Take by mouth.     lisinopril (ZESTRIL) 20 MG tablet Take 20 mg by mouth daily.     Magnesium 500 MG CAPS Take by mouth.     Melatonin 10 MG TABS Take 10 mg by mouth at bedtime as needed (sleep).     metFORMIN (GLUCOPHAGE) 1000 MG tablet Take 1 tablet (1,000 mg total) by mouth 2 (two) times daily with a meal. 180 tablet 3   mirabegron ER (MYRBETRIQ) 50 MG TB24 tablet Take 1 tablet (50 mg total) by mouth daily. 90 tablet 3   Multiple Vitamin (MULTI-VITAMIN) tablet Take 1 tablet by mouth daily.     ondansetron (ZOFRAN ODT) 4 MG disintegrating tablet Take 1 tablet (4 mg total) by mouth every 8 (eight) hours as needed for nausea or vomiting. 20 tablet 0   oxyCODONE ER (XTAMPZA ER) 13.5 MG C12A Take 1 tablet by mouth 2 (two) times daily.     pregabalin (LYRICA) 200 MG capsule Take 200 mg by mouth 2 (two) times daily.     promethazine (PHENERGAN) 25 MG tablet Take 1 tablet (25 mg total) by mouth as needed. (Patient not taking: No sig reported) 15 tablet 0   Pyridoxine HCl (B-6 PO) Take  by mouth.     QUEtiapine (SEROQUEL XR) 300 MG 24 hr tablet Take 1 tablet (300 mg total) by mouth at bedtime. 90  tablet 0   QUEtiapine (SEROQUEL XR) 50 MG TB24 24 hr tablet Take 1 tablet (50 mg total) by mouth at bedtime. Take along with 300 mg at bedtime 90 tablet 0   tacrolimus (PROGRAF) 1 MG capsule Take 1 capsule by mouth once daily (Patient taking differently: Take 1 mg by mouth 2 (two) times daily.) 30 capsule 0   venlafaxine XR (EFFEXOR-XR) 37.5 MG 24 hr capsule Take 1 capsule (37.5 mg total) by mouth daily with breakfast. 90 capsule 0   VITAMIN D, CHOLECALCIFEROL, PO Take by mouth.     Current Facility-Administered Medications  Medication Dose Route Frequency Provider Last Rate Last Admin   sulfamethoxazole-trimethoprim (BACTRIM DS) 800-160 MG per tablet 1 tablet  1 tablet Oral Q12H Hollice Espy, MD   1 tablet at 01/17/21 1432     Musculoskeletal: Strength & Muscle Tone:  UTA Gait & Station:  Seated Patient leans: N/A  Psychiatric Specialty Exam: Review of Systems  Musculoskeletal:        Facial pain and numbness - right sided  Psychiatric/Behavioral:  The patient is nervous/anxious.   All other systems reviewed and are negative.  Last menstrual period 02/04/2018.There is no height or weight on file to calculate BMI.  General Appearance: Casual  Eye Contact:  Fair  Speech:  Clear and Coherent  Volume:  Normal  Mood:  Anxious, unmotivated  Affect:  Congruent  Thought Process:  Goal Directed and Descriptions of Associations: Intact  Orientation:  Full (Time, Place, and Person)  Thought Content: Logical   Suicidal Thoughts:  No  Homicidal Thoughts:  No  Memory:  Immediate;   Fair Recent;   Fair Remote;   Fair  Judgement:  Fair  Insight:  Good  Psychomotor Activity:  Normal  Concentration:  Concentration: Fair and Attention Span: Fair  Recall:  [employer] of Knowledge: Fair  Language: Fair  Akathisia:  No  Handed:  Right  AIMS (if indicated): done  Assets:  Communication Skills Desire for Improvement Housing Social Support Transportation Vocational/Educational   ADL's:  Intact  Cognition: WNL  Sleep:  Fair   Screenings: AIMS    Flowsheet Row Video Visit from 05/09/2021 in [place] Total Score 0      GAD-7    Flowsheet Row Video Visit from 05/28/2021 in [facility] Video Visit from 05/09/2021 in [place] Visit from 12/04/2020 in [facility]  Total GAD-7 Score _0 PHQ2-9    Converse from 03/28/2021 in [place] Video Visit from 02/07/2021 in [place] Video Visit from 12/19/2020 in [place] Office Visit from 12/11/2020 in [facility] Office Visit from 12/04/2020 in [facility]  PHQ-2 Total Score _1 PHQ-9 Total Score 18 15 -- 5 17      Flowsheet Row Nutrition from 03/28/2021 in [place] ED from 01/18/2021 in [place] Admission (Discharged) from 01/09/2021 in [place] Error: Question 6 not populated No Risk No Risk        Assessment and Plan: Shawnetta Lein is a 45 year old Caucasian female on disability, has a history of bipolar disorder,  PTSD, history of borderline personality disorder, chronic pain, fibromyalgia, trigeminal neuralgia, history of liver transplant, B-cell lymphoma, SLE, diabetes melitis, OSA, migraine headaches, recent diagnosis of asterixis was evaluated by telemedicine today.  Patient with multiple medical problems including facial pain, otherwise is currently tolerating the venlafaxine well, will benefit from the following plan.  Plan Bipolar disorder mixed mild in partial remission Seroquel extended release 350 mg p.o. nightly Continue venlafaxine as prescribed.   PTSD-improving Melatonin 5 to 10 mg p.o. nightly Hydroxyzine 25  mg p.o. 3 times daily as needed for anxiety attacks  GAD-improving Venlafaxine extended release 37.5 mg p.o. daily She is interested in staying on this dosage, will let writer know if she is interested in a dose increase.  Neuroleptic induced Parkinson's disease-stable Cogentin 1 mg p.o. daily as needed  Patient to continue to follow-up with neurology, for her asterixis, facial pain.  Follow-up in clinic in 3 to 4 weeks or sooner if needed.  This note was generated in part or whole with voice recognition software. Voice recognition is usually quite accurate but there are transcription errors that can and very often do occur. I apologize for any typographical errors that were not detected and corrected.         [name], MD 05/29/2021, 4:31 PM

## 2021-05-29 ENCOUNTER — Other Ambulatory Visit: Payer: Self-pay

## 2021-05-29 ENCOUNTER — Ambulatory Visit
Admission: RE | Admit: 2021-05-29 | Discharge: 2021-05-29 | Disposition: A | Discharge status: Home / Self Care | Payer: Medicaid Other | Source: Ambulatory Visit | Attending: Neurology | Admitting: Neurology

## 2021-05-29 DIAGNOSIS — R519 Headache, unspecified: Secondary | ICD-10-CM | POA: Diagnosis not present

## 2021-05-29 DIAGNOSIS — R278 Other lack of coordination: Secondary | ICD-10-CM | POA: Diagnosis not present

## 2021-06-04 DIAGNOSIS — Z79899 Other long term (current) drug therapy: Secondary | ICD-10-CM | POA: Diagnosis not present

## 2021-06-15 ENCOUNTER — Other Ambulatory Visit: Payer: Self-pay | Admitting: Obstetrics and Gynecology

## 2021-06-15 ENCOUNTER — Other Ambulatory Visit: Payer: Self-pay

## 2021-06-15 NOTE — Patient Outreach (Signed)
Medicaid Managed Care   Nurse Care Manager Note  06/15/2021 Name:  Alison [name] MRN:  [id number] DOB:  10-10-1975  Alison [name] is an 45 y.o. year old female who is a primary patient of [place].  The [facility] Managed Care Coordination team was consulted for assistance with:    Chronic healthcare management needs.  [name] was given information about Medicaid Managed Care Coordination team services today. [name] Patient agreed to services and verbal consent obtained.  Engaged with patient by telephone for follow up visit in response to provider referral for case management and/or care coordination services.   Assessments/Interventions:  Review of past medical history, allergies, medications, health status, including review of consultants reports, laboratory and other test data, was performed as part of comprehensive evaluation and provision of chronic care management services.  SDOH (Social Determinants of Health) assessments and interventions performed:   Care Plan  Allergies  Allergen Reactions   Penicillin G Itching, Rash, Anaphylaxis and Palpitations   Lamictal [Lamotrigine] Rash   Carbamazepine Other (See Comments)    Medication interaction-prograf   Hydrocodone-Acetaminophen Itching   Naproxen Itching    Medications Reviewed Today     Reviewed by [name], RN (Registered Nurse) on 06/15/21 at 1032  Med List Status: <None>   Medication Order Taking? Sig Documenting Provider Last Dose Status Informant  baclofen (LIORESAL) 10 MG tablet [id number] No Take by mouth. [provider] Taking Active   baclofen (LIORESAL) 20 MG tablet [id number] No Take 1 tablet (20 mg total) by mouth 3 (three) times daily as needed for muscle spasms.  Patient taking differently: Take 10 mg by mouth 2 (two) times daily.   [name], FNP Taking Active Self  benztropine (COGENTIN) 1 MG tablet [id number] No Take 1 tablet (1 mg total) by mouth  daily as needed for tremors. [name], MD Taking Active Self  Dulaglutide (TRULICITY) 5.44 BE/0.1EO SOPN [id number] No Inject 0.75 mg into the skin once a week.  Patient taking differently: Inject 0.75 mg into the skin every Wednesday.   Myles Gip, DO Taking Active   ergocalciferol (VITAMIN D2) 1.25 MG (50000 UT) capsule [id number] No Take 1 capsule by mouth once a week. [provider] Taking Active   ezetimibe (ZETIA) 10 MG tablet [id number] No Take 10 mg by mouth daily. [provider] Taking Active   glucose blood (COOL BLOOD GLUCOSE TEST STRIPS) test strip [id number] No Use as instructed  Patient not taking: No sig reported   Steele Sizer, MD Not Taking Active   hydrOXYzine (VISTARIL) 25 MG capsule [id number] No Take by mouth. [provider] Taking Active   icosapent Ethyl (VASCEPA) 1 g capsule [id number] No Take by mouth. [provider] Taking Active   lisinopril (ZESTRIL) 20 MG tablet [id number] No Take 20 mg by mouth daily. [provider] Taking Active   Magnesium 500 MG CAPS [id number] No Take by mouth. [provider] Taking Active   Melatonin 10 MG TABS [id number] No Take 10 mg by mouth at bedtime as needed (sleep). [provider] Taking Active Self  metFORMIN (GLUCOPHAGE) 1000 MG tablet [id number] No Take 1 tablet (1,000 mg total) by mouth 2 (two) times daily with a meal. Myles Gip, DO Taking Active Self  mirabegron ER (MYRBETRIQ) 50 MG TB24 tablet [id number] No Take 1 tablet (50 mg total) by mouth daily. Debroah Loop, PA-C Taking Active Self  Multiple Vitamin (MULTI-VITAMIN) tablet [id number] No Take 1 tablet by  mouth daily. [provider] Taking Active Self  ondansetron (ZOFRAN ODT) 4 MG disintegrating tablet [id number] No Take 1 tablet (4 mg total) by mouth every 8 (eight) hours as needed for nausea or vomiting. Paulette Blanch, MD Taking Active   oxyCODONE ER [facility] ER) 13.5 MG C12A  [id number] No Take 1 tablet by mouth 2 (two) times daily. [provider] Taking Active   pregabalin (LYRICA) 200 MG capsule [id number] No Take 200 mg by mouth 2 (two) times daily. [provider] Taking Active   promethazine (PHENERGAN) 25 MG tablet [id number] No Take 1 tablet (25 mg total) by mouth as needed.  Patient not taking: No sig reported   Myles Gip, DO Not Taking Active Self  Pyridoxine HCl (B-6 PO) [id number] No Take by mouth. [provider] Taking Active   QUEtiapine (SEROQUEL XR) 300 MG 24 hr tablet [id number]  Take 1 tablet (300 mg total) by mouth at bedtime. [name], MD  Active   QUEtiapine (SEROQUEL XR) 50 MG TB24 24 hr tablet [id number] No Take 1 tablet (50 mg total) by mouth at bedtime. Take along with 300 mg at bedtime [name], MD Taking Active   sulfamethoxazole-trimethoprim (BACTRIM DS) 800-160 MG per tablet 1 tablet [id number]   Hollice Espy, MD  Active   tacrolimus (PROGRAF) 1 MG capsule [id number] No Take 1 capsule by mouth once daily  Patient taking differently: Take 1 mg by mouth 2 (two) times daily.   [name], FNP Taking Active            Med Note [name], [name]   Fri Jan 19, 2021  5:01 AM)    venlafaxine XR (EFFEXOR-XR) 37.5 MG 24 hr capsule [id number]  Take 1 capsule (37.5 mg total) by mouth daily with breakfast. [name], MD  Active   VITAMIN D, CHOLECALCIFEROL, PO [id number] No Take by mouth. 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/12/2019   Edema of lower extremity 09/16/2019   Carpal tunnel syndrome, left 07/26/2019   Cubital tunnel syndrome on left 07/26/2019   Bipolar I disorder, most recent episode depressed ([place]) 07/12/2019   PTSD (post-traumatic stress disorder) 78/24/2353   Umbilical hernia without obstruction and without gangrene 06/17/2019   Encounter for surveillance of abnormal nevi 06/17/2019   Pineal gland cyst 06/17/2019   Chronic hip pain, left 06/02/2019   Lumbar spondylosis 06/02/2019   Mouth dryness 06/02/2019   Obesity (BMI 35.0-39.9 without comorbidity) 06/02/2019   Goals of care, counseling/discussion 05/18/2019   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[place]) 05/18/2019   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[place]) 05/18/2019   Occipital neuralgia 05/13/2019   Plantar fasciitis of left foot 04/27/2019   Fibromyalgia 03/23/2019   Systemic lupus erythematosus (SLE) in adult [facility]) 03/23/2019   Essential hypertension 03/23/2019   Hepatitis 03/23/2019   Mass of left kidney 03/23/2019   Diabetes mellitus ([place]) 03/23/2019   Nausea without vomiting 03/23/2019   Neuropathy  03/23/2019   Cancer ([place]) 03/23/2019   Hyperlipidemia 03/23/2019   Osteoarthritis 03/23/2019   Trigeminal neuralgia 03/23/2019   Chronic renal disease, stage III ([place]) 03/23/2019   Nephrolithiasis 03/23/2019   Migraine 03/23/2019   OSA on CPAP 03/23/2019   Fibrocystic breast 03/23/2019   Heart murmur 03/23/2019   Bipolar disorder, in partial remission, most recent episode depressed ([place]) 07/02/2017   Abnormal uterine bleeding 03/18/2017   Major depressive disorder, recurrent ([place]) 03/18/2017   Morbid obesity ([place]) 03/18/2017   Secondary hyperparathyroidism ([place]) 02/07/2017   Neuropathic pain 11/19/2016   Post herpetic neuralgia 11/19/2016   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[place]) 01/13/2015   Lumbar  disc disease with radiculopathy 07/26/2013   S/P liver transplant ([place]) 07/26/2013   Type 2 diabetes mellitus, uncontrolled ([place]) 07/26/2013   Facial nerve disorder 06/29/2012   Low back pain 12/26/2010    Conditions to be addressed/monitored per PCP order:   chronic healthcare management needs,  history of bipolar disorder, PTSD, history of borderline personality disorder, chronic pain, fibromyalgia, trigeminal neuralgia, history of liver transplant, B-cell lymphoma, SLE, diabetes melitis, OSA, migraine headaches,   Care Plan : Chronic Pain (Adult)  Updates made by [name], RN since 06/15/2021 12:00 AM     Problem: Chronic Pain Management-fibromyalgia   Priority: High  Onset Date: 01/22/2021     Long-Range Goal: Fibromyalgia pain managed-new pain management provider   Start Date: 08/14/2020  Expected End Date: 08/15/2021  Recent Progress: On track  Priority: High  Note:   Current Barriers:  Care Coordination needs related to pain management provider.   Patient is experiencing fibromyalgia flair and is interested in help being connected to a pain management provider. Unable to independently secure referral or appointment for pain management provider. Update 11/24/20:  Patient is being followed by Dr. Humphrey Rolls.  Nurse Case Manager Clinical Goal(s):  Over the next 45 days, patient will work with [facility] to address needs related to referral for pain management provider and associated care coordination needs. Update 09/22/20:  patient is currently seeing Dr. Humphrey Rolls at [facility].  Interventions:  Inter-disciplinary care team collaboration (see longitudinal plan of care) Evaluation of current treatment plan related to fibromyalgia  and patient's adherence to plan as established by provider. Update 02/21/21:  Patient has restarted Cymbalta. Update 04/19/21:  No complaints today. Update 05/15/21:  Patient's Cymbalta and Baclofen stopped by provider, patient will contact  provider for alternative as she is having increased symptoms. Update 06/15/21:  Patient taking Baclofen again, Effexor added. Collaborated with primary care provider regarding recommendations and referral to pain management provider. Discussed plans with patient for ongoing care management follow up and provided patient with direct contact information for care management team Anticipate pain education program, pain management support as part of pain management referral.      [name] contacted patient to check in. Patient stated she has not contacted an eye doctor nor has she seen a specialist. Patient states she thinks she has COVID due to her husband having it. She has not taken a test yet, but has some symptoms. She is isolating in the bedroom and they have someone to cook and bring her food.       Update 11/16/20: [name] completed phone call with patient, she states she is still having symptoms from [place] and has been in bed all month. Patient states she has not had a chance to contact an eye doctor or specialist due to being sick. Update 11/24/20:  Patient with continued fatigue-will follow up with PCP. Update 01/22/21:  patient continues to follow up with provider. Pharmacy referral for medication review. Update 01/22/21:  Patient met with Pharmacist-continuing to follow. Update 12/14/20: [name] completed call with patient, she stated she is waiting on a call from [facility] about getting a liver biopsy completed. She has not reached out to the eye doctor yet because her liver is top priority right now. Update 12/22/20:  Patient seen by PCP 12/11/20.  Has not seen Opthamologist yet.  Had liver biopsy 12/20/20 due to increased liver enzymes.  Update 01/15/21: [name] completed a follow up with patient. She stated she found out she has to have a liver transplant and is currently completing testing. Patient stated that she has been in pain since her pcp took her off of her pain medication. Patient stated her rent will be going up by  $400 and they have to move. They will most likely move to [place] area because her fiance' works in that area. No resources needed at this time. Update 03/20/21:  Pain states no pain at this time.  Has decided to stay in current apartment.  Patient states her liver function tests have improved and she is stable right now. Update 05/15/21:  Encouraged patient to schedule appointment with Ophthalmologist-patient states she will do this. Update 06/15/21:  Patient to schedule an appointment with ophthalmologist-has not done so yet.    Patient Goals/Self-Care Activities Over the next 45 days, patient will:  -Attends all scheduled provider appointments  develop a personal pain management plan with your pain management provider.  Follow Up Plan:  The Managed Medicaid care management team will reach out to the patient again over the next 30 days   Care Plan : Wellness (Adult)  Updates made by [name], RN since 06/15/2021 12:00 AM     Problem: Medication Adherence (Wellness)   Priority: High  Onset Date: 01/22/2021     Long-Range Goal: Medication Adherence Maintained   Start Date: 09/06/2020  Expected End Date: 08/15/2021  Recent Progress: Not on track  Priority: High  Note:   Current Barriers:  Patient states she needs to have prescriptions refilled and feels she has some side effects from her medications. Patient with only 1 vehicle now and having trouble attending appointments-has cancelled 2 appointments recently. Patient with new neck lump-has CT scheduled 04/24/21, ? Lymphoma. Update 12/22/20:  Patient's PCP discontinued Victoza and added Trulicity.  Patient has not been taking mediations as prescribed.  Hgb A1C 8.1 on 12/11/20. Update 01/22/21:  Patient states she is taking medications.  She checks blood sugar once a day and needs more testing strips.  Blood sugars 200's per patient. Update 02/21/21:  Patient is checking blood sugars 4 times a day.  Has appointment with endocrinologist  today and is keeping food diary. Update 03/20/21:  Blood sugars mid 100's perpatient-has appointment with nutritionist 03/28/21. Update 04/20/21:  Blood sugars 220-300 per patient-states she saw Provider and Nutritionist-is working on reducing carbohydrates. Update 05/15/21:  Patient states her blood sugars have been between 120 and 140-she has not checked in a couple of days.  Has upcoming appointment with nutritionist for follow up. Update 06/15/21:  Patient has not checked blood sugars in a week  as she needs new strips-lost her kit on trip to [place]-has new meter, needs to pick up strips.  Patient has no transportation issues now.  Follow up appointment with nutritionist 06/29/21.  Nurse Case Manager Clinical Goal(s):  Over the next  30 days, patient will work with CM team pharmacist to review current medications. Update 01/22/21:  Patient met with Pharmacist and continues to follow. Over the next 30 days, patient will meet with Nutritionist and check her blood sugars. Over the next 30 days, patient will attend all scheduled appointments.  Interventions:  Inter-disciplinary care team collaboration (see longitudinal plan of care) Evaluation of current treatment plan and patient's adherence to plan as established by provider. Advised patient to contact her PCP for any medication needs. Reviewed medications with patient. Collaborated with pharmacy regarding medications.  Discussed plans with patient for ongoing care management follow up and provided patient with direct contact information for care management team Pharmacy referral for medication review. Patient given phone number for [facility] transportation if needed. Will notify PCP of need for testing strips and dietician referral. Update 02/21/21:  Patient has appointment with nutritionist 03/28/21 and has all testing supplies needed.  Patient Goals/Self-Care Activities Over the next 30 days, patient will:  -Patient will take  medications as prescribed. Calls pharmacy for medication refills Calls provider office for new concerns or questions  Follow Up Plan: The Managed Medicaid care management team will reach out to the patient again over the next 30 days.  The patient has been provided with contact information for the Managed Medicaid care management team and has been advised to call with any health related questions or concerns.     Follow Up:  Patient agrees to Care Plan and Follow-up.  Plan: The Managed Medicaid care management team will reach out to the patient again over the next 30 days. and The patient has been provided with contact information for the Managed Medicaid care management team and has been advised to call with any health related questions or concerns.  Date/time of next scheduled RN care management/care coordination outreach:  07/11/21 at 1245.

## 2021-06-15 NOTE — Patient Instructions (Signed)
Hi Ms. Slay, thank you for speaking with me today-I hope you feel better soon.  Ms. Pfefferle was given information about Medicaid Managed Care team care coordination services as a part of their Healthy [facility] Medicaid benefit. Afifa Truax verbally consented to engagement with the [facility] Managed Care team.   If you are experiencing a medical emergency, please call 911 or report to your local emergency department or urgent care.   If you have a non-emergency medical problem during routine business hours, please contact your provider's office and ask to speak with a nurse.   For questions related to your Healthy [facility] health plan, please call: [phone] or visit the homepage here: [email or web address]  If you would like to schedule transportation through your Healthy [facility] plan, please call the following number at least 2 days in advance of your appointment: [phone]  Call the [place] at [phone], at any time, 24 hours a day, 7 days a week. If you are in danger or need immediate medical attention call 911.  If you would like help to quit smoking, call 1-800-QUIT-NOW 504-272-1766) OR Espaol: 1-855-Djelo-Ya (7-253-664-4034) o para ms informacin haga clic aqu or Text READY to 200-400 to register via text  Ms. Briney - following are the goals we discussed in your visit today:   Goals Addressed             This Visit's Progress    Chronic Pain Managed        Update 01/22/21:  Patient sees Dr. Humphrey Rolls for chronic pain management. Update 02/21/21:  Patient is restarting Cymbalta. Update 03/20/21:  Chronic pain stable per patient-no complaints today-recently saw Dr. Humphrey Rolls Update 04/19/21:  No complaints today Update 05/15/21:  Cymbalta and Baclofen stopped by provider-patient experiencing more symptoms-will contact provider. Update 06/15/21:  Patient restarted on Baclofen, Effexor added.  Evidence-based  guidance:  Address common beliefs about pain, such as pain is to be endured, a normal part of aging or that it is not "real"; feelings of resignation that nothing can be done and that complaining will be a sign of weakness.  Assess pain level, treatment efficacy and patient response at regular intervals using a consistent pain scale.  Assess pain using self-report (most reliable), family/caregiver report, validated pain scale; consider impact on quality of life.  Determine if pain is associated with mobility or at rest, location, intensity, frequency, duration, recurrence, pattern and description (e.g., cramping, burning, aching), triggers and relieving factors.   Anticipate referral to pain education program, pain management support or community resources for specific diagnoses (e.g., cancer, fibromyalgia, multiple sclerosis).  Explore fears associated with anticipated or imagined pain; encourage acceptance-based approaches.  Encourage exposure to experiences previously avoided due to fear of pain.  Anticipate referral to pain management specialist, physical therapist, addiction specialist (if history of substance use), psychotherapist; advocate for consultation with pharmacist.  Initiate nonpharmacologic measures, such as cognitive behavior therapy, mindfulness, guided imagery, massage, distraction, relaxation, chiropractic manipulation, dietary supplements or acupuncture.  Provide multimodal treatment interventions, such as physical activity, therapeutic exercise, yoga, TENS (transcutaneous electrical nerve stimulation) and manual therapy.  Train in functional activity modifications, such as body mechanics, posture, ergonomics, energy conservation and activity pacing.  Encourage use of local anesthetic or analgesic therapy as an adjunct for pain control (e.g., lidocaine patch, capsaicin cream, topical nonsteroidal anti-inflammatory drugs).  Prepare patient for use of pharmacologic therapy in a  stepped approach that may include acetaminophen, nonsteroidal anti-inflammatory drugs, opioid, antiepileptic, antidepressant or nonbenzodiazepine muscle  relaxant.  Review efficacy, tolerability, adherence and manage medication-induced side effects.      The patient verbalized understanding of instructions provided today and declined a print copy of patient instruction materials.   The Managed Medicaid care management team will reach out to the patient again over the next 30 days.  The  [facility] has been provided with contact information for the Managed Medicaid care management team and has been advised to call with any health related questions or concerns.   [name], [name]   [facility] - Managed Medicaid High Risk [phone].    Following is a copy of your plan of care:  Patient Care Plan: Chronic Pain (Adult)     Problem Identified: Chronic Pain Management-fibromyalgia   Priority: High  Onset Date: 01/22/2021     Long-Range Goal: Fibromyalgia pain managed-new pain management provider   Start Date: 08/14/2020  Expected End Date: 08/15/2021  Recent Progress: On track  Priority: High  Note:   Current Barriers:  Care Coordination needs related to pain management provider.   Patient is experiencing fibromyalgia flair and is interested in help being connected to a pain management provider. Unable to independently secure referral or appointment for pain management provider. Update 11/24/20:  Patient is being followed by Dr. Humphrey Rolls.  Nurse Case Manager Clinical Goal(s):  Over the next 45 days, patient will work with [facility] to address needs related to referral for pain management provider and associated care coordination needs. Update 09/22/20:  patient is currently seeing Dr. Humphrey Rolls at [facility].  Interventions:  Inter-disciplinary care team collaboration (see longitudinal plan of care) Evaluation of current  treatment plan related to fibromyalgia  and patient's adherence to plan as established by provider. Update 02/21/21:  Patient has restarted Cymbalta. Update 04/19/21:  No complaints today. Update 05/15/21:  Patient's Cymbalta and Baclofen stopped by provider, patient will contact provider for alternative as she is having increased symptoms. Update 06/15/21:  Patient taking Baclofen again, Effexor added. Collaborated with primary care provider regarding recommendations and referral to pain management provider. Discussed plans with patient for ongoing care management follow up and provided patient with direct contact information for care management team Anticipate pain education program, pain management support as part of pain management referral.      [name] contacted patient to check in. Patient stated she has not contacted an eye doctor nor has she seen a specialist. Patient states she thinks she has COVID due to her husband having it. She has not taken a test yet, but has some symptoms. She is isolating in the bedroom and they have someone to cook and bring her food.       Update 11/16/20: [name] completed phone call with patient, she states she is still having symptoms from [place] and has been in bed all month. Patient states she has not had a chance to contact an eye doctor or specialist due to being sick. Update 11/24/20:  Patient with continued fatigue-will follow up with PCP. Update 01/22/21:  patient continues to follow up with provider. Pharmacy referral for medication review. Update 01/22/21:  Patient met with Pharmacist-continuing to follow. Update 12/14/20: [name] completed call with patient, she stated she is waiting on a call from [facility] about getting a liver biopsy completed. She has not reached out to the eye doctor yet because her liver is top priority right now. Update 12/22/20:  Patient seen by PCP 12/11/20.  Has not seen Opthamologist yet.  Had liver biopsy 12/20/20 due to increased liver enzymes.  Update  01/15/21: [name] completed a follow up with patient. She stated she found out she has to have a liver transplant and is currently completing testing. Patient stated that she has been in pain since her pcp took her off of her pain medication. Patient stated her rent will be going up by $400 and they have to move. They will most likely move to [place] area because her fiance' works in that area. No resources needed at this time. Update 03/20/21:  Pain states no pain at this time.  Has decided to stay in current apartment.  Patient states her liver function tests have improved and she is stable right now. Update 05/15/21:  Encouraged patient to schedule appointment with Ophthalmologist-patient states she will do this. Update 06/15/21:  Patient to schedule an appointment with ophthalmologist-has not done so yet.    Patient Goals/Self-Care Activities Over the next 45 days, patient will:  -Attends all scheduled provider appointments  develop a personal pain management plan with your pain management provider.  Follow Up Plan:  The Managed Medicaid care management team will reach out to the patient again over the next 30 days.   Patient Care Plan: Wellness (Adult)     Problem Identified: Medication Adherence (Wellness)   Priority: High  Onset Date: 01/22/2021     Long-Range Goal: Medication Adherence Maintained   Start Date: 09/06/2020  Expected End Date: 08/15/2021  Recent Progress: Not on track  Priority: High  Note:   Current Barriers:  Patient states she needs to have prescriptions refilled and feels she has some side effects from her medications. Patient with only 1 vehicle now and having trouble attending appointments-has cancelled 2 appointments recently. Patient with new neck lump-has CT scheduled 04/24/21, ? Lymphoma. Update 12/22/20:  Patient's PCP discontinued Victoza and added Trulicity.  Patient has not been taking mediations as prescribed.  Hgb A1C 8.1 on 12/11/20. Update 01/22/21:   Patient states she is taking medications.  She checks blood sugar once a day and needs more testing strips.  Blood sugars 200's per patient. Update 02/21/21:  Patient is checking blood sugars 4 times a day.  Has appointment with endocrinologist today and is keeping food diary. Update 03/20/21:  Blood sugars mid 100's perpatient-has appointment with nutritionist 03/28/21. Update 04/20/21:  Blood sugars 220-300 per patient-states she saw Provider and Nutritionist-is working on reducing carbohydrates. Update 05/15/21:  Patient states her blood sugars have been between 120 and 140-she has not checked in a couple of days.  Has upcoming appointment with nutritionist for follow up. Update 06/15/21:  Patient has not checked blood sugars in a week  as she needs new strips-lost her kit on trip to [place]-has new meter, needs to pick up strips.  Patient has no transportation issues now.  Follow up appointment with nutritionist 06/29/21.  Nurse Case Manager Clinical Goal(s):  Over the next 30 days, patient will work with CM team pharmacist to review current medications. Update 01/22/21:  Patient met with Pharmacist and continues to follow. Over the next 30 days, patient will meet with Nutritionist and check her blood sugars. Over the next 30 days, patient will attend all scheduled appointments.  Interventions:  Inter-disciplinary care team collaboration (see longitudinal plan of care) Evaluation of current treatment plan and patient's adherence to plan as established by provider. Advised patient to contact her PCP for any medication needs. Reviewed medications with patient. Collaborated with pharmacy regarding medications.  Discussed plans with  patient for ongoing care management follow up and provided patient with direct contact information for care management team Pharmacy referral for medication review. Patient given phone number for [facility] transportation if needed. Will notify PCP of need  for testing strips and dietician referral. Update 02/21/21:  Patient has appointment with nutritionist 03/28/21 and has all testing supplies needed.  Patient Goals/Self-Care Activities Over the next 30 days, patient will:  -Patient will take medications as prescribed. Calls pharmacy for medication refills Calls provider office for new concerns or questions  Follow Up Plan: The Managed Medicaid care management team will reach out to the patient again over the next 30 days.  The patient has been provided with contact information for the Managed Medicaid care management team and has been advised to call with any health related questions or concerns.

## 2021-06-18 ENCOUNTER — Telehealth (INDEPENDENT_AMBULATORY_CARE_PROVIDER_SITE_OTHER): Payer: Medicaid Other | Admitting: Psychiatry

## 2021-06-18 ENCOUNTER — Other Ambulatory Visit: Payer: Self-pay

## 2021-06-18 ENCOUNTER — Encounter: Payer: Self-pay | Admitting: Psychiatry

## 2021-06-18 DIAGNOSIS — F411 Generalized anxiety disorder: Secondary | ICD-10-CM

## 2021-06-18 DIAGNOSIS — F3178 Bipolar disorder, in full remission, most recent episode mixed: Secondary | ICD-10-CM | POA: Diagnosis not present

## 2021-06-18 DIAGNOSIS — G2111 Neuroleptic induced parkinsonism: Secondary | ICD-10-CM | POA: Diagnosis not present

## 2021-06-18 DIAGNOSIS — F4310 Post-traumatic stress disorder, unspecified: Secondary | ICD-10-CM | POA: Diagnosis not present

## 2021-06-18 MED ORDER — QUETIAPINE FUMARATE ER 50 MG PO TB24: 50.0000 mg | ORAL_TABLET | Freq: Every day | ORAL | 0 refills | Status: DC

## 2021-06-18 NOTE — Progress Notes (Signed)
Virtual Visit via Video Note  I connected with Alison [name] on 06/18/21 at 11:40 AM EDT by a video enabled telemedicine application and verified that I am speaking with the correct person using two identifiers.  Location Provider Location : [facility] Patient Location : Home  Participants: Patient , Provider   I discussed the limitations of evaluation and management by telemedicine and the availability of in person appointments. The patient expressed understanding and agreed to proceed.    I discussed the assessment and treatment plan with the patient. The patient was provided an opportunity to ask questions and all were answered. The patient agreed with the plan and demonstrated an understanding of the instructions.   The patient was advised to call back or seek an in-person evaluation if the symptoms worsen or if the condition fails to improve as anticipated.                                                     [place] MD OP Progress Note  06/18/2021 12:01 PM Shaheen Star  MRN:  [id number]  Chief Complaint:  Chief Complaint   Follow-up; Depression; Anxiety    HPI: Alison [name] is a 45 year old Caucasian female, lives in [place], has a history of bipolar disorder, PTSD, multiple medical problems including fibromyalgia, chronic pain, trigeminal neuralgia, history of liver transplant, B-cell lymphoma, SLE, diabetes melitis, OSA, migraine headaches was evaluated by telemedicine today.  Patient today reports she is currently struggling with trigeminal neuralgia, facial pain, right-sided.  She reports however she is on Lyrica which helps.  She does have upcoming appointment with neurology.  Patient reports she was able to attend the convention which she was looking forward to.  She reports she had a good time with her friends.  Patient reports due to the trip that she took for the convention and the excitement due to the same she did have sleep issues however she is currently getting  better.  Patient denies any significant mood swings.  Denies suicidality, homicidality or perceptual disturbances.  Patient is compliant on medications.  Denies side effects.  Patient denies any other concerns today.  Visit Diagnosis:    ICD-10-CM   1. Bipolar disorder, in full remission, most recent episode mixed (HCC)  F31.78 QUEtiapine (SEROQUEL XR) 50 MG TB24 24 hr tablet    2. PTSD (post-traumatic stress disorder)  F43.10     3. GAD (generalized anxiety disorder)  F41.1     4. 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   Chemo tx's   Migraine    Morbid obesity (HCC)    Neuromuscular disorder (HCC)    neuropathy   Neuropathy    Personality disorder ([place])    Post herpetic neuralgia    PTSD (post-traumatic stress disorder)    Renal disorder    S/P liver transplant [facility])     Past Surgical History:  Procedure Laterality Date   BONE MARROW BIOPSY  01/13/2015   BREAST BIOPSY  12/2014   BREAST BIOPSY  2011   BREAST SURGERY     CHOLECYSTECTOMY     COLONOSCOPY     ESOPHAGOGASTRODUODENOSCOPY     ESOPHAGOGASTRODUODENOSCOPY (EGD) WITH PROPOFOL N/A 01/09/2021   Procedure: ESOPHAGOGASTRODUODENOSCOPY (EGD) WITH PROPOFOL;  Surgeon: [name], MD;  Location:  [facility] ENDOSCOPY;  Service: Endoscopy;  Laterality: N/A;   HERNIA REPAIR     infusaport     LIVER TRANSPLANT  12/17/1991   LUMBAR PUNCTURE     PORT A CATH INJECTION ([place] HX)     tumor removal  2015    Family Psychiatric History: Reviewed family psychiatric history from progress note on 06/10/2019  Family History:  Family History  Problem Relation Age of Onset   Heart disease Mother    Hypertension Mother    Cancer - Other Mother    Bipolar disorder Mother    Heart disease Father    Hypertension Father    Diabetes Father    Parkinson's disease Maternal Grandmother    Cancer Maternal Aunt    Cancer Maternal Uncle    Cancer Maternal Grandfather    Lupus Paternal Grandmother    Hypertension Brother     Social History: Reviewed social history from progress note on 06/10/2019 Social History   Socioeconomic History   Marital status: Single    Spouse name: Not on file   Number of children: 0   Years of education: 9   Highest education level: GED or equivalent  Occupational History   Occupation: disabled  Tobacco Use   Smoking status: Never   Smokeless tobacco: Never  Vaping Use   Vaping Use: Never used  Substance and Sexual Activity   Alcohol use: Not Currently   Drug use: Not Currently    Types: Marijuana    Comment: pain managment last used in early april   Sexual activity: Not Currently  Other Topics Concern   Not on file  Social History Narrative   Not on file   Social Determinants of Health   Financial Resource Strain: Medium Risk   Difficulty of Paying Living Expenses: Somewhat hard  Food Insecurity: Landscape architect Present   Worried About Charity fundraiser in the Last Year: Sometimes true   Arboriculturist in the Last Year: Sometimes true  Transportation Needs: Unmet Transportation Needs   Lack of Transportation (Medical): Yes   Lack of Transportation (Non-Medical): Yes  Physical Activity: Insufficiently Active   Days of Exercise per Week: 1 day    Minutes of Exercise per Session: 30 min  Stress: Stress Concern Present   Feeling of Stress : Rather much  Social Connections: Moderately Integrated   Frequency of Communication with Friends and Family: More than three times a week   Frequency of Social Gatherings with Friends and Family: More than three times a week   Attends Religious Services: More than 4 times per year   Active Member of [employer] or Organizations: No   Attends Archivist Meetings: Never   Marital Status:  Living with partner    Allergies:  Allergies  Allergen Reactions   Penicillin G Itching, Rash, Anaphylaxis and Palpitations   Lamictal [Lamotrigine] Rash   Carbamazepine Other (See Comments)    Medication interaction-prograf   Hydrocodone-Acetaminophen Itching   Naproxen Itching    Metabolic Disorder Labs: Lab Results  Component Value Date   HGBA1C 8.9 (A) 12/11/2020   MPG 209 06/05/2020   Lab Results  Component Value Date   PROLACTIN 13.4 07/15/2019   PROLACTIN 21.3 05/24/2019   Lab Results  Component Value Date   CHOL 219 (H) 12/11/2020   TRIG 333 (H) 12/11/2020   HDL 38 (L) 12/11/2020   CHOLHDL 5.8 (H) 12/11/2020   LDLCALC 133 (H) 12/11/2020   LDLCALC 111 (H) 06/05/2020   Lab Results  Component Value Date   TSH 2.650 07/15/2019   TSH 4.650 (H) 05/24/2019    Therapeutic Level Labs: No results found for: LITHIUM No results found for: VALPROATE No components found for:  CBMZ  Current Medications: Current Outpatient Medications  Medication Sig Dispense Refill   baclofen (LIORESAL) 10 MG tablet Take by mouth.     baclofen (LIORESAL) 20 MG tablet Take 1 tablet (20 mg total) by mouth 3 (three) times daily as needed for muscle spasms. (Patient taking differently: Take 10 mg by mouth 2 (two) times daily.) 90 each 2   benztropine (COGENTIN) 1 MG tablet Take 1 tablet (1 mg total) by mouth daily as needed for tremors. 30 tablet 1   Dulaglutide (TRULICITY) 2.33 AQ/7.6AU SOPN Inject 0.75 mg  into the skin once a week. (Patient taking differently: Inject 0.75 mg into the skin every Wednesday.) 3 mL 3   ergocalciferol (VITAMIN D2) 1.25 MG (50000 UT) capsule Take 1 capsule by mouth once a week.     ezetimibe (ZETIA) 10 MG tablet Take 10 mg by mouth daily.     glucose blood (COOL BLOOD GLUCOSE TEST STRIPS) test strip Use as instructed (Patient not taking: No sig reported) 100 each 12   hydrOXYzine (VISTARIL) 25 MG capsule Take by mouth.     icosapent Ethyl (VASCEPA) 1 g capsule Take by mouth.     lisinopril (ZESTRIL) 20 MG tablet Take 20 mg by mouth daily.     LORazepam (ATIVAN) 1 MG tablet Take by mouth.     Magnesium 500 MG CAPS Take by mouth.     Melatonin 10 MG TABS Take 10 mg by mouth at bedtime as needed (sleep).     metFORMIN (GLUCOPHAGE) 1000 MG tablet Take 1 tablet (1,000 mg total) by mouth 2 (two) times daily with a meal. 180 tablet 3   mirabegron ER (MYRBETRIQ) 50 MG TB24 tablet Take 1 tablet (50 mg total) by mouth daily. 90 tablet 3   Multiple Vitamin (MULTI-VITAMIN) tablet Take 1 tablet by mouth daily.     ondansetron (ZOFRAN ODT) 4 MG disintegrating tablet Take 1 tablet (4 mg total) by mouth every 8 (eight) hours as needed for nausea or vomiting. 20 tablet 0   oxyCODONE ER (XTAMPZA ER) 13.5 MG C12A Take 1 tablet by mouth 2 (two) times daily.     pregabalin (LYRICA) 200 MG capsule Take 200 mg by mouth 2 (two) times daily.     promethazine (PHENERGAN) 25 MG tablet Take 1 tablet (25 mg total) by mouth as needed. (Patient not taking: No sig reported) 15 tablet 0   Pyridoxine HCl (B-6 PO) Take by mouth.     QUEtiapine (SEROQUEL XR) 300 MG 24 hr  tablet Take 1 tablet (300 mg total) by mouth at bedtime. 90 tablet 0   QUEtiapine (SEROQUEL XR) 50 MG TB24 24 hr tablet Take 1 tablet (50 mg total) by mouth at bedtime. Take along with 300 mg at bedtime 90 tablet 0   tacrolimus (PROGRAF) 1 MG capsule Take 1 capsule by mouth once daily (Patient taking differently: Take 1 mg by mouth 2  (two) times daily.) 30 capsule 0   venlafaxine XR (EFFEXOR-XR) 37.5 MG 24 hr capsule Take 1 capsule (37.5 mg total) by mouth daily with breakfast. 90 capsule 0   VITAMIN D, CHOLECALCIFEROL, PO Take by mouth.     Current Facility-Administered Medications  Medication Dose Route Frequency Provider Last Rate Last Admin   sulfamethoxazole-trimethoprim (BACTRIM DS) 800-160 MG per tablet 1 tablet  1 tablet Oral Q12H Hollice Espy, MD   1 tablet at 01/17/21 1432     Musculoskeletal: Strength & Muscle Tone:  UTA Gait & Station: normal Patient leans: N/A  Psychiatric Specialty Exam: Review of Systems  HENT:         Facial pain  Neurological:  Positive for tremors.  Psychiatric/Behavioral:  The patient is nervous/anxious.   All other systems reviewed and are negative.  Last menstrual period 02/04/2018.There is no height or weight on file to calculate BMI.  General Appearance: Casual  Eye Contact:  Fair  Speech:  Clear and Coherent  Volume:  Normal  Mood:  Anxious  Affect:  Congruent  Thought Process:  Goal Directed and Descriptions of Associations: Intact  Orientation:  Full (Time, Place, and Person)  Thought Content: Logical   Suicidal Thoughts:  No  Homicidal Thoughts:  No  Memory:  Immediate;   Fair Recent;   Fair Remote;   Fair  Judgement:  Fair  Insight:  Fair  Psychomotor Activity:  Tremor  Concentration:  Concentration: Fair and Attention Span: Fair  Recall:  [employer] of Knowledge: Fair  Language: Fair  Akathisia:  No  Handed:  Right  AIMS (if indicated): not done  Assets:  Communication Skills Desire for Improvement Housing Social Support  ADL's:  Intact  Cognition: WNL  Sleep:   Fair   Screenings: AIMS    Flowsheet Row Video Visit from 05/09/2021 in [place] Total Score 0      GAD-7    Flowsheet Row Video Visit from 05/28/2021 in [place] Video Visit from 05/09/2021 in [place] Visit from 12/04/2020 in [facility]  Total GAD-7 Score _0 PHQ2-9    [place] from 03/28/2021 in [place] Video Visit from 02/07/2021 in [place] Video Visit from 12/19/2020 in [place] Office Visit [facility] from 12/04/2020 in [facility]  PHQ-2 Total Score _1 PHQ-9 Total Score 18 15 -- 5 17      Flowsheet Row Nutrition from 03/28/2021 in [place] [facility] from 01/18/2021 in [place] Admission (Discharged) from 01/09/2021 in [place] Error: Question 6 not populated No Risk No Risk        Assessment and Plan: Alison [name] is a 45 year old Caucasian female on disability, has a history of bipolar disorder, PTSD, history of borderline personality disorder, chronic pain, fibromyalgia, trigeminal neuralgia, history of  liver transplant, B-cell lymphoma, SLE, diabetes melitis, OSA, migraine headaches, recent diagnosis of asterixis was evaluated by telemedicine today.  Patient continues to have facial pain and has upcoming neurology evaluation however otherwise is stable on medications.  Plan as noted below.  Plan Bipolar disorder mixed in remission Seroquel extended release 350 mg p.o. nightly Continue venlafaxine as prescribed   GAD-improving Venlafaxine extended release 37.5 mg p.o. daily  Neuroleptic induced Parkinson's disease-stable Cogentin 1 mg p.o. daily as needed  Patient will benefit from sufficient pain management has upcoming appointment with neurology  Follow-up in clinic in person in 6 weeks or sooner if needed.  This note was generated in part or whole with voice recognition software. Voice recognition is usually quite  accurate but there are transcription errors that can and very often do occur. I apologize for any typographical errors that were not detected and corrected.    [name], MD 06/18/2021, 12:01 PM

## 2021-06-19 DIAGNOSIS — Z944 Liver transplant status: Secondary | ICD-10-CM | POA: Diagnosis not present

## 2021-06-19 DIAGNOSIS — Z5181 Encounter for therapeutic drug level monitoring: Secondary | ICD-10-CM | POA: Diagnosis not present

## 2021-06-20 DIAGNOSIS — F4310 Post-traumatic stress disorder, unspecified: Secondary | ICD-10-CM | POA: Diagnosis not present

## 2021-06-20 DIAGNOSIS — F3181 Bipolar II disorder: Secondary | ICD-10-CM | POA: Diagnosis not present

## 2021-06-20 DIAGNOSIS — F411 Generalized anxiety disorder: Secondary | ICD-10-CM | POA: Diagnosis not present

## 2021-06-25 ENCOUNTER — Ambulatory Visit: Payer: Medicaid Other | Admitting: Oncology

## 2021-06-25 ENCOUNTER — Other Ambulatory Visit: Payer: Medicaid Other

## 2021-06-27 DIAGNOSIS — I10 Essential (primary) hypertension: Secondary | ICD-10-CM | POA: Diagnosis not present

## 2021-06-27 DIAGNOSIS — F411 Generalized anxiety disorder: Secondary | ICD-10-CM | POA: Diagnosis not present

## 2021-06-27 DIAGNOSIS — E669 Obesity, unspecified: Secondary | ICD-10-CM | POA: Diagnosis not present

## 2021-06-27 DIAGNOSIS — F3131 Bipolar disorder, current episode depressed, mild: Secondary | ICD-10-CM | POA: Diagnosis not present

## 2021-06-27 DIAGNOSIS — Z944 Liver transplant status: Secondary | ICD-10-CM | POA: Diagnosis not present

## 2021-06-27 DIAGNOSIS — C8580 Other specified types of non-Hodgkin lymphoma, unspecified site: Secondary | ICD-10-CM | POA: Diagnosis not present

## 2021-06-27 DIAGNOSIS — R52 Pain, unspecified: Secondary | ICD-10-CM | POA: Diagnosis not present

## 2021-06-27 DIAGNOSIS — M797 Fibromyalgia: Secondary | ICD-10-CM | POA: Diagnosis not present

## 2021-06-27 DIAGNOSIS — Z6838 Body mass index (BMI) 38.0-38.9, adult: Secondary | ICD-10-CM | POA: Diagnosis not present

## 2021-06-27 DIAGNOSIS — E1165 Type 2 diabetes mellitus with hyperglycemia: Secondary | ICD-10-CM | POA: Diagnosis not present

## 2021-06-27 DIAGNOSIS — E7849 Other hyperlipidemia: Secondary | ICD-10-CM | POA: Diagnosis not present

## 2021-06-27 DIAGNOSIS — K7581 Nonalcoholic steatohepatitis (NASH): Secondary | ICD-10-CM | POA: Diagnosis not present

## 2021-06-29 ENCOUNTER — Ambulatory Visit: Payer: Medicaid Other | Admitting: Dietician

## 2021-07-03 ENCOUNTER — Other Ambulatory Visit: Payer: Self-pay | Admitting: Transplant Hepatology

## 2021-07-03 ENCOUNTER — Other Ambulatory Visit: Payer: Self-pay | Admitting: Psychiatry

## 2021-07-03 DIAGNOSIS — K7581 Nonalcoholic steatohepatitis (NASH): Secondary | ICD-10-CM

## 2021-07-03 DIAGNOSIS — Z944 Liver transplant status: Secondary | ICD-10-CM

## 2021-07-03 DIAGNOSIS — K7460 Unspecified cirrhosis of liver: Secondary | ICD-10-CM

## 2021-07-03 DIAGNOSIS — F4310 Post-traumatic stress disorder, unspecified: Secondary | ICD-10-CM

## 2021-07-04 ENCOUNTER — Telehealth: Payer: Self-pay

## 2021-07-04 ENCOUNTER — Other Ambulatory Visit: Payer: Self-pay | Admitting: Psychiatry

## 2021-07-04 DIAGNOSIS — F4310 Post-traumatic stress disorder, unspecified: Secondary | ICD-10-CM | POA: Diagnosis not present

## 2021-07-04 DIAGNOSIS — F3181 Bipolar II disorder: Secondary | ICD-10-CM | POA: Diagnosis not present

## 2021-07-04 DIAGNOSIS — F3161 Bipolar disorder, current episode mixed, mild: Secondary | ICD-10-CM

## 2021-07-04 DIAGNOSIS — F411 Generalized anxiety disorder: Secondary | ICD-10-CM | POA: Diagnosis not present

## 2021-07-04 NOTE — Telephone Encounter (Signed)
received a fax that a prior auth was needed on the quetiapine

## 2021-07-04 NOTE — Telephone Encounter (Signed)
went online and submitted the prior auth for the quetiapine

## 2021-07-04 NOTE — Telephone Encounter (Signed)
received fax that the prior auth for quetiapine was approved from 07-04-21 to 07-04-22 [place] Case # [id number]

## 2021-07-09 ENCOUNTER — Other Ambulatory Visit
Admission: RE | Admit: 2021-07-09 | Discharge: 2021-07-09 | Disposition: A | Discharge status: Home / Self Care | Payer: Medicaid Other | Source: Ambulatory Visit | Attending: Neurology | Admitting: Neurology

## 2021-07-09 ENCOUNTER — Other Ambulatory Visit: Payer: Self-pay

## 2021-07-09 DIAGNOSIS — Z20822 Contact with and (suspected) exposure to covid-19: Secondary | ICD-10-CM | POA: Diagnosis not present

## 2021-07-09 DIAGNOSIS — Z01812 Encounter for preprocedural laboratory examination: Secondary | ICD-10-CM | POA: Insufficient documentation

## 2021-07-09 LAB — SARS CORONAVIRUS 2 (TAT 6-24 HRS): SARS Coronavirus 2: NEGATIVE

## 2021-07-11 ENCOUNTER — Other Ambulatory Visit: Payer: Self-pay

## 2021-07-11 ENCOUNTER — Other Ambulatory Visit: Payer: Medicaid Other

## 2021-07-11 ENCOUNTER — Other Ambulatory Visit: Payer: Self-pay | Admitting: Obstetrics and Gynecology

## 2021-07-11 ENCOUNTER — Ambulatory Visit: Payer: Medicaid Other | Attending: Neurology

## 2021-07-11 DIAGNOSIS — G4733 Obstructive sleep apnea (adult) (pediatric): Secondary | ICD-10-CM | POA: Insufficient documentation

## 2021-07-11 NOTE — Patient Outreach (Signed)
Medicaid Managed Care   Nurse Care Manager Note  07/11/2021 Name:  Alison [name] MRN:  [id number] DOB:  05-26-1976  Alison [name] is an 45 y.o. year old female who is a primary patient of [facility].  The [facility] Managed Care Coordination team was consulted for assistance with:    Chronic healthcare management needs.   Alison [name] was given information about Medicaid Managed Care Coordination team services today. Alison [name] Patient agreed to services and verbal consent obtained.  Engaged with patient by telephone for follow up visit in response to provider referral for case management and/or care coordination services.   Assessments/Interventions:  Review of past medical history, allergies, medications, health status, including review of consultants reports, laboratory and other test data, was performed as part of comprehensive evaluation and provision of chronic care management services.  SDOH (Social Determinants of Health) assessments and interventions performed: SDOH Interventions    Flowsheet Row Most Recent Value  SDOH Interventions   Food Insecurity Interventions Intervention Not Indicated  Housing Interventions Intervention Not Indicated  Intimate Partner Violence Interventions Intervention Not Indicated  Transportation Interventions Intervention Not Indicated       Care Plan  Allergies  Allergen Reactions   Penicillin G Itching, Rash, Anaphylaxis and Palpitations   Lamictal [Lamotrigine] Rash   Carbamazepine Other (See Comments)    Medication interaction-prograf   Hydrocodone-Acetaminophen Itching   Naproxen Itching    Medications Reviewed Today     Reviewed by [name], RN (Registered Nurse) on 07/11/21 at 1250  Med List Status: <None>   Medication Order Taking? Sig Documenting Provider Last Dose Status Informant  baclofen (LIORESAL) 10 MG tablet [id number]  Take by mouth. [provider]  Active   baclofen (LIORESAL)  20 MG tablet [id number]  Take 1 tablet (20 mg total) by mouth 3 (three) times daily as needed for muscle spasms.  Patient taking differently: Take 10 mg by mouth 2 (two) times daily. 41m in the morning and 10 mg in the evening   BHubbard Hartshorn FNP  Active Self  benztropine (COGENTIN) 1 MG tablet [id number] Take 1 tablet (1 mg total) by mouth daily as needed for tremors. EUrsula Alert MD  Active Self  Dulaglutide (TRULICITY) 08.41MLK/4.4WNSOPN [id number] Inject 0.75 mg into the skin once a week.  Patient taking differently: Inject 0.75 mg into the skin every Wednesday.   RMyles Gip DO  Active   ergocalciferol (VITAMIN D2) 1.25 MG (50000 UT) capsule [id number] Take 1 capsule by mouth once a week. [provider]  Active   ezetimibe (ZETIA) 10 MG tablet [id number] Take 10 mg by mouth daily. [provider]  Active   glucose blood (COOL BLOOD GLUCOSE TEST STRIPS) test strip [id number] Use as instructed  Patient not taking: No sig reported   SSteele Sizer MD  Active   hydrOXYzine (VISTARIL) 25 MG capsule [id number] Take by mouth. [provider]  Active   icosapent Ethyl (VASCEPA) 1 g capsule [id number] Take by mouth. [provider]  Active   lisinopril (ZESTRIL) 20 MG tablet [id number] Take 20 mg by mouth daily. [provider]  Active   LORazepam (ATIVAN) 1 MG tablet [id number] Take by mouth. [provider]  Active   Magnesium 500 MG CAPS [id number] Take by mouth. [provider]  Active   Melatonin 10 MG TABS [id number] Take 10 mg by mouth at bedtime as needed (sleep).  [provider]  Active Self  metFORMIN (GLUCOPHAGE) 1000 MG tablet [id number]  Take 1 tablet (1,000 mg total) by mouth 2 (two) times daily with a meal. Myles Gip, DO  Active Self  mirabegron ER (MYRBETRIQ) 50 MG TB24 tablet [id number]  Take 1 tablet (50 mg total) by mouth daily. Debroah Loop, PA-C  Active Self  Multiple Vitamin  (MULTI-VITAMIN) tablet [id number]  Take 1 tablet by mouth daily. [provider]  Active Self  ondansetron (ZOFRAN ODT) 4 MG disintegrating tablet [id number]  Take 1 tablet (4 mg total) by mouth every 8 (eight) hours as needed for nausea or vomiting. Paulette Blanch, MD  Active   oxyCODONE ER [facility] ER) 13.5 MG C12A [id number]  Take 1 tablet by mouth 2 (two) times daily. [provider]  Active   pregabalin (LYRICA) 200 MG capsule [id number]  Take 200 mg by mouth 2 (two) times daily. [provider]  Active   promethazine (PHENERGAN) 25 MG tablet [id number] Yes Take 1 tablet (25 mg total) by mouth as needed. Myles Gip, DO Taking Active Self  Pyridoxine HCl (B-6 PO) [id number]  Take by mouth. [provider]  Active   QUEtiapine (SEROQUEL XR) 300 MG 24 hr tablet [id number]  Take 1 tablet (300 mg total) by mouth at bedtime. [name], MD  Active   QUEtiapine (SEROQUEL XR) 50 MG TB24 24 hr tablet [id number]  Take 1 tablet (50 mg total) by mouth at bedtime. Take along with 300 mg at bedtime [name], MD  Active   sulfamethoxazole-trimethoprim (BACTRIM DS) 800-160 MG per tablet 1 tablet [id number]   Hollice Espy, MD  Active   tacrolimus (PROGRAF) 1 MG capsule [id number]  Take 1 capsule by mouth once daily  Patient taking differently: Take 1 mg by mouth 1 day or 1 dose.   [name], FNP  Active Self           Med Note [name], [name]   Fri Jan 19, 2021  5:01 AM)    venlafaxine XR (EFFEXOR-XR) 37.5 MG 24 hr capsule [id number]  TAKE ONE CAPSULE BY MOUTH DAILY WITH Eldridge Abrahams, MD  Active   VITAMIN D, CHOLECALCIFEROL, PO [id number]  Take by mouth. [provider]  Active             Patient Active Problem List   Diagnosis Date Noted   Bipolar 1 disorder, mixed, mild ([place]) 05/09/2021   GAD (generalized anxiety disorder) 05/09/2021   Hepatic cirrhosis ([place])    Opioid dependence with opioid-induced disorder (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   Edema of lower extremity 09/16/2019   Carpal tunnel syndrome, left 07/26/2019   Cubital tunnel syndrome on left 07/26/2019   Bipolar I disorder, most recent episode depressed ([place]) 07/12/2019   PTSD (post-traumatic stress disorder) 75/17/0017   Umbilical hernia without obstruction and without gangrene 06/17/2019   Encounter for surveillance of abnormal nevi 06/17/2019   Pineal gland cyst 06/17/2019   Chronic hip pain, left 06/02/2019   Lumbar spondylosis 06/02/2019   Mouth dryness 06/02/2019   Obesity (BMI 35.0-39.9 without comorbidity) 06/02/2019   Goals of care, counseling/discussion 05/18/2019   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[place]) 05/18/2019   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  [place]) 05/18/2019   Occipital neuralgia 05/13/2019   Plantar fasciitis of left foot 04/27/2019   Fibromyalgia 03/23/2019   Systemic lupus erythematosus (SLE) in adult [facility]) 03/23/2019   Essential hypertension 03/23/2019   Hepatitis 03/23/2019   Mass of left kidney 03/23/2019   Diabetes mellitus ([place]) 03/23/2019   Nausea without vomiting 03/23/2019   Neuropathy 03/23/2019   Cancer ([place]) 03/23/2019   Hyperlipidemia 03/23/2019   Osteoarthritis 03/23/2019   Trigeminal neuralgia 03/23/2019   Chronic renal disease, stage III ([place]) 03/23/2019   Nephrolithiasis 03/23/2019   Migraine 03/23/2019   OSA on CPAP 03/23/2019   Fibrocystic breast 03/23/2019   Heart murmur 03/23/2019   Bipolar disorder, in partial remission, most recent episode depressed ([place]) 07/02/2017   Abnormal uterine bleeding 03/18/2017   Major depressive disorder, recurrent ([place]) 03/18/2017   Morbid  obesity ([place]) 03/18/2017   Secondary hyperparathyroidism ([place]) 02/07/2017   Neuropathic pain 11/19/2016   Post herpetic neuralgia 11/19/2016   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  Care Plan : Chronic Pain (Adult)  Updates made by [name], RN since 07/11/2021 12:00 AM     Problem: Chronic Pain Management-fibromyalgia   Priority: High  Onset Date: 01/22/2021     Long-Range Goal: Fibromyalgia pain managed-new pain management provider   Start Date: 08/14/2020  Expected End Date: 08/15/2021  Recent Progress: On track  Priority: High  Note:   Current Barriers:  Care Coordination needs related to pain management provider.   Patient is experiencing fibromyalgia flair and is interested in help being connected to a pain management provider. Unable to independently secure referral or appointment for pain management provider. Update 11/24/20:  Patient is being followed by Dr. Humphrey Rolls. Update 07/11/21:  Patient without complaint today-pain managed.  Nurse Case Manager Clinical Goal(s):  Over the next 45 days, patient will work with [facility] to address needs related to referral for pain management provider and associated care coordination needs. Update 09/22/20:  patient is currently seeing Dr. Humphrey Rolls at [facility].  Interventions:  Inter-disciplinary care team collaboration (see longitudinal plan of care) Evaluation of current treatment plan related to fibromyalgia  and patient's adherence to plan as established by  provider. Update 02/21/21:  Patient has restarted Cymbalta. Update 04/19/21:  No complaints today. Update 05/15/21:  Patient's Cymbalta and Baclofen stopped by provider, patient will contact provider for alternative as she is having increased symptoms. Update 06/15/21:  Patient taking Baclofen again, Effexor added. Collaborated with primary care provider regarding recommendations and referral to pain management provider. Discussed plans with patient for ongoing care management follow up and provided patient with direct contact information for care management team Anticipate pain education program, pain management support as part of pain management referral.      [name] contacted patient to check in. Patient stated she has not contacted an eye doctor nor has she seen a specialist. Patient states she thinks she has COVID due to her husband having it. She has not taken a test yet, but has some symptoms. She is isolating in the bedroom and they have someone to cook  and bring her food.       Update 11/16/20: [name] completed phone call with patient, she states she is still having symptoms from [place] and has been in bed all month. Patient states she has not had a chance to contact an eye doctor or specialist due to being sick. Update 11/24/20:  Patient with continued fatigue-will follow up with PCP. Update 01/22/21:  patient continues to follow up with provider. Pharmacy referral for medication review. Update 01/22/21:  Patient met with Pharmacist-continuing to follow. Update 12/14/20: [name] completed call with patient, she stated she is waiting on a call from [facility] about getting a liver biopsy completed. She has not reached out to the eye doctor yet because her liver is top priority right now. Update 12/22/20:  Patient seen by PCP 12/11/20.  Has not seen Opthamologist yet.  Had liver biopsy 12/20/20 due to increased liver enzymes.  Update 01/15/21: [name] completed a follow up with patient. She stated she found out she has to have a  liver transplant and is currently completing testing. Patient stated that she has been in pain since her pcp took her off of her pain medication. Patient stated her rent will be going up by $400 and they have to move. They will most likely move to [place] area because her fiance' works in that area. No resources needed at this time. Update 03/20/21:  Pain states no pain at this time.  Has decided to stay in current apartment.  Patient states her liver function tests have improved and she is stable right now. Update 05/15/21:  Encouraged patient to schedule appointment with Ophthalmologist-patient states she will do this. Update 06/15/21:  Patient to schedule an appointment with ophthalmologist-has not done so yet.   Update 07/11/21:  Patient stable today-states she will schedule eye appointment today.  Patient Goals/Self-Care Activities Over the next 45 days, patient will:  -Attends all scheduled provider appointments  develop a personal pain management plan with your pain management provider.  Follow Up Plan:  The Managed Medicaid care management team will reach out to the patient again over the next 30 days   Care Plan : Wellness (Adult)  Updates made by [name], RN since 07/11/2021 12:00 AM     Problem: Medication Adherence (Wellness)   Priority: High  Onset Date: 01/22/2021     Long-Range Goal: Medication Adherence Maintained   Start Date: 09/06/2020  Expected End Date: 08/15/2021  Recent Progress: Not on track  Priority: High  Note:   Current Barriers:  Patient states she needs to have prescriptions refilled and feels she has some side effects from her medications. Patient with only 1 vehicle now and having trouble attending appointments-has cancelled 2 appointments recently. Patient with new neck lump-has CT scheduled 04/24/21, ? Lymphoma. Update 12/22/20:  Patient's PCP discontinued Victoza and added Trulicity.  Patient has not been taking mediations as prescribed.  Hgb A1C  8.1 on 12/11/20. Update 01/22/21:  Patient states she is taking medications.  She checks blood sugar once a day and needs more testing strips.  Blood sugars 200's per patient. Update 02/21/21:  Patient is checking blood sugars 4 times a day.  Has appointment with endocrinologist today and is keeping food diary. Update 03/20/21:  Blood sugars mid 100's perpatient-has appointment with nutritionist 03/28/21. Update 04/20/21:  Blood sugars 220-300 per patient-states she saw Provider and Nutritionist-is working on reducing carbohydrates. Update 05/15/21:  Patient states her blood sugars have been between 120 and 140-she has not checked in  a couple of days.  Has upcoming appointment with nutritionist for follow up. Update 06/15/21:  Patient has not checked blood sugars in a week  as she needs new strips-lost her kit on trip to [place]-has new meter, needs to pick up strips.  Patient has no transportation issues now.  Follow up appointment with nutritionist 06/29/21. Update 07/11/21:  Patient doing well today-no complaints.  Bought new Lucianne Lei, getting prescriptions delivered by [facility] except for pain meds-gets those at [facility].  Checking blood sugars twice a day-100-142.  Continues to see nutritionist.  Nurse Case Manager Clinical Goal(s):  Over the next 30 days, patient will work with CM team pharmacist to review current medications. Update 01/22/21:  Patient met with Pharmacist and continues to follow. Over the next 30 days, patient will meet with Nutritionist and check her blood sugars. Over the next 30 days, patient will attend all scheduled appointments.  Interventions:  Inter-disciplinary care team collaboration (see longitudinal plan of care) Evaluation of current treatment plan and patient's adherence to plan as established by provider. Advised patient to contact her PCP for any medication needs. Reviewed medications with patient. Collaborated with pharmacy regarding medications.  Discussed plans with  patient for ongoing care management follow up and provided patient with direct contact information for care management team Pharmacy referral for medication review. Patient given phone number for [facility] transportation if needed. Will notify PCP of need for testing strips and dietician referral. Update 02/21/21:  Patient has appointment with nutritionist 03/28/21 and has all testing supplies needed.  Patient Goals/Self-Care Activities Over the next 30 days, patient will:  -Patient will take medications as prescribed. Calls pharmacy for medication refills Calls provider office for new concerns or questions  Follow Up Plan: The Managed Medicaid care management team will reach out to the patient again over the next 30 days.  The patient has been provided with contact information for the Managed Medicaid care management team and has been advised to call with any health related questions or concerns.    Follow Up:  Patient agrees to Care Plan and Follow-up.  Plan: The Managed Medicaid care management team will reach out to the patient again over the next 30 days. and The patient has been provided with contact information for the Managed Medicaid care management team and has been advised to call with any health related questions or concerns.  Date/time of next scheduled RN care management/care coordination outreach:  08/06/21 at 1230.

## 2021-07-11 NOTE — Patient Instructions (Signed)
Hi Ms. Garinger, thank you for speaking with me today-glad you are feeling better-have a great afternoon!  Ms. Sherrard was given information about Medicaid Managed Care team care coordination services as a part of their Healthy [facility] Medicaid benefit. Roshan Salamon verbally consented to engagement with the [facility] Managed Care team.   If you are experiencing a medical emergency, please call 911 or report to your local emergency department or urgent care.   If you have a non-emergency medical problem during routine business hours, please contact your provider's office and ask to speak with a nurse.   For questions related to your Healthy [facility] health plan, please call: [phone] or visit the homepage here: [email or web address]  If you would like to schedule transportation through your Healthy [facility] plan, please call the following number at least 2 days in advance of your appointment: [phone]  Call the [place] at [phone], at any time, 24 hours a day, 7 days a week. If you are in danger or need immediate medical attention call 911.  If you would like help to quit smoking, call 1-800-QUIT-NOW 409-764-7989) OR Espaol: 1-855-Djelo-Ya (1-443-154-0086) o para ms informacin haga clic aqu or Text READY to 200-400 to register via text  Ms. Grunow - following are the goals we discussed in your visit today:   Goals Addressed             This Visit's Progress    Chronic Pain Managed        Update 01/22/21:  Patient sees Dr. Humphrey Rolls for chronic pain management. Update 02/21/21:  Patient is restarting Cymbalta. Update 03/20/21:  Chronic pain stable per patient-no complaints today-recently saw Dr. Humphrey Rolls Update 04/19/21:  No complaints today Update 05/15/21:  Cymbalta and Baclofen stopped by provider-patient experiencing more symptoms-will contact provider. Update 06/15/21:  Patient restarted on Baclofen, Effexor  added. Update 07/11/21:  patient without complaints today-feels good  Evidence-based guidance:  Address common beliefs about pain, such as pain is to be endured, a normal part of aging or that it is not "real"; feelings of resignation that nothing can be done and that complaining will be a sign of weakness.  Assess pain level, treatment efficacy and patient response at regular intervals using a consistent pain scale.  Assess pain using self-report (most reliable), family/caregiver report, validated pain scale; consider impact on quality of life.  Determine if pain is associated with mobility or at rest, location, intensity, frequency, duration, recurrence, pattern and description (e.g., cramping, burning, aching), triggers and relieving factors.   Anticipate referral to pain education program, pain management support or community resources for specific diagnoses (e.g., cancer, fibromyalgia, multiple sclerosis).  Explore fears associated with anticipated or imagined pain; encourage acceptance-based approaches.  Encourage exposure to experiences previously avoided due to fear of pain.  Anticipate referral to pain management specialist, physical therapist, addiction specialist (if history of substance use), psychotherapist; advocate for consultation with pharmacist.  Initiate nonpharmacologic measures, such as cognitive behavior therapy, mindfulness, guided imagery, massage, distraction, relaxation, chiropractic manipulation, dietary supplements or acupuncture.  Provide multimodal treatment interventions, such as physical activity, therapeutic exercise, yoga, TENS (transcutaneous electrical nerve stimulation) and manual therapy.  Train in functional activity modifications, such as body mechanics, posture, ergonomics, energy conservation and activity pacing.  Encourage use of local anesthetic or analgesic therapy as an adjunct for pain control (e.g., lidocaine patch, capsaicin cream, topical nonsteroidal  anti-inflammatory drugs).  Prepare patient for use of pharmacologic therapy in a stepped approach that may include  acetaminophen, nonsteroidal anti-inflammatory drugs, opioid, antiepileptic, antidepressant or nonbenzodiazepine muscle relaxant.  Review efficacy, tolerability, adherence and manage medication-induced side effects.     The patient verbalized understanding of instructions provided today and declined a print copy of patient instruction materials.   The Managed Medicaid care management team will reach out to the patient again over the next 30 days.  The  Patient has been provided with contact information for the Managed Medicaid care management team and has been advised to call with any health related questions or concerns.   [name], [name] [place]  [facility] Curator - Managed Medicaid High Risk [phone].   Following is a copy of your plan of care:  Care Plan : Chronic Pain (Adult)  Updates made by [name], RN since 07/11/2021 12:00 AM     Problem: Chronic Pain Management-fibromyalgia   Priority: High  Onset Date: 01/22/2021     Long-Range Goal: Fibromyalgia pain managed-new pain management provider   Start Date: 08/14/2020  Expected End Date: 08/15/2021  Recent Progress: On track  Priority: High  Note:   Current Barriers:  Care Coordination needs related to pain management provider.   Patient is experiencing fibromyalgia flair and is interested in help being connected to a pain management provider. Unable to independently secure referral or appointment for pain management provider. Update 11/24/20:  Patient is being followed by Dr. Humphrey Rolls. Update 07/11/21:  Patient without complaint today-pain managed.  Nurse Case Manager Clinical Goal(s):  Over the next 45 days, patient will work with [facility] to address needs related to referral for pain management provider and associated care coordination needs. Update 09/22/20:  patient  is currently seeing Dr. Humphrey Rolls at [facility].  Interventions:  Inter-disciplinary care team collaboration (see longitudinal plan of care) Evaluation of current treatment plan related to fibromyalgia  and patient's adherence to plan as established by provider. Update 02/21/21:  Patient has restarted Cymbalta. Update 04/19/21:  No complaints today. Update 05/15/21:  Patient's Cymbalta and Baclofen stopped by provider, patient will contact provider for alternative as she is having increased symptoms. Update 06/15/21:  Patient taking Baclofen again, Effexor added. Collaborated with primary care provider regarding recommendations and referral to pain management provider. Discussed plans with patient for ongoing care management follow up and provided patient with direct contact information for care management team Anticipate pain education program, pain management support as part of pain management referral.      [name] contacted patient to check in. Patient stated she has not contacted an eye doctor nor has she seen a specialist. Patient states she thinks she has COVID due to her husband having it. She has not taken a test yet, but has some symptoms. She is isolating in the bedroom and they have someone to cook and bring her food.       Update 11/16/20: [name] completed phone call with patient, she states she is still having symptoms from [place] and has been in bed all month. Patient states she has not had a chance to contact an eye doctor or specialist due to being sick. Update 11/24/20:  Patient with continued fatigue-will follow up with PCP. Update 01/22/21:  patient continues to follow up with provider. Pharmacy referral for medication review. Update 01/22/21:  Patient met with Pharmacist-continuing to follow. Update 12/14/20: [name] completed call with patient, she stated she is waiting on a call from [facility] about getting a liver biopsy completed. She has not reached out to  the eye doctor yet because  her liver is top priority right now. Update 12/22/20:  Patient seen by PCP 12/11/20.  Has not seen Opthamologist yet.  Had liver biopsy 12/20/20 due to increased liver enzymes.  Update 01/15/21: [name] completed a follow up with patient. She stated she found out she has to have a liver transplant and is currently completing testing. Patient stated that she has been in pain since her pcp took her off of her pain medication. Patient stated her rent will be going up by $400 and they have to move. They will most likely move to [place] area because her fiance' works in that area. No resources needed at this time. Update 03/20/21:  Pain states no pain at this time.  Has decided to stay in current apartment.  Patient states her liver function tests have improved and she is stable right now. Update 05/15/21:  Encouraged patient to schedule appointment with Ophthalmologist-patient states she will do this. Update 06/15/21:  Patient to schedule an appointment with ophthalmologist-has not done so yet.   Update 07/11/21:  Patient stable today-states she will schedule eye appointment today.  Patient Goals/Self-Care Activities Over the next 45 days, patient will:  -Attends all scheduled provider appointments  develop a personal pain management plan with your pain management provider.  Follow Up Plan:  The Managed Medicaid care management team will reach out to the patient again over the next 30 days.    Care Plan : Wellness (Adult)  Updates made by [name], RN since 07/11/2021 12:00 AM     Problem: Medication Adherence (Wellness)   Priority: High  Onset Date: 01/22/2021     Long-Range Goal: Medication Adherence Maintained   Start Date: 09/06/2020  Expected End Date: 08/15/2021  Recent Progress: Not on track  Priority: High  Note:   Current Barriers:  Patient states she needs to have prescriptions refilled and feels she has some side effects from her medications. Patient with only 1 vehicle now and  having trouble attending appointments-has cancelled 2 appointments recently. Patient with new neck lump-has CT scheduled 04/24/21, ? Lymphoma. Update 12/22/20:  Patient's PCP discontinued Victoza and added Trulicity.  Patient has not been taking mediations as prescribed.  Hgb A1C 8.1 on 12/11/20. Update 01/22/21:  Patient states she is taking medications.  She checks blood sugar once a day and needs more testing strips.  Blood sugars 200's per patient. Update 02/21/21:  Patient is checking blood sugars 4 times a day.  Has appointment with endocrinologist today and is keeping food diary. Update 03/20/21:  Blood sugars mid 100's perpatient-has appointment with nutritionist 03/28/21. Update 04/20/21:  Blood sugars 220-300 per patient-states she saw Provider and Nutritionist-is working on reducing carbohydrates. Update 05/15/21:  Patient states her blood sugars have been between 120 and 140-she has not checked in a couple of days.  Has upcoming appointment with nutritionist for follow up. Update 06/15/21:  Patient has not checked blood sugars in a week  as she needs new strips-lost her kit on trip to [place]-has new meter, needs to pick up strips.  Patient has no transportation issues now.  Follow up appointment with nutritionist 06/29/21. Update 07/11/21:  Patient doing well today-no complaints.  Bought new Lucianne Lei, getting prescriptions delivered by [facility] except for pain meds-gets those at [facility].  Checking blood sugars twice a day-100-142.  Continues to see nutritionist.  Nurse Case Manager Clinical Goal(s):  Over the next 30 days, patient will work with CM team pharmacist to review  current medications. Update 01/22/21:  Patient met with Pharmacist and continues to follow. Over the next 30 days, patient will meet with Nutritionist and check her blood sugars. Over the next 30 days, patient will attend all scheduled appointments.  Interventions:  Inter-disciplinary care team collaboration (see longitudinal plan of  care) Evaluation of current treatment plan and patient's adherence to plan as established by provider. Advised patient to contact her PCP for any medication needs. Reviewed medications with patient. Collaborated with pharmacy regarding medications.  Discussed plans with patient for ongoing care management follow up and provided patient with direct contact information for care management team Pharmacy referral for medication review. Patient given phone number for Healthy [facility] transportation if needed. Will notify PCP of need for testing strips and dietician referral. Update 02/21/21:  Patient has appointment with nutritionist 03/28/21 and has all testing supplies needed.  Patient Goals/Self-Care Activities Over the next 30 days, patient will:  -Patient will take medications as prescribed. Calls pharmacy for medication refills Calls provider office for new concerns or questions  Follow Up Plan: The Managed Medicaid care management team will reach out to the patient again over the next 30 days.  The patient has been provided with contact information for the Managed Medicaid care management team and has been advised to call with any health related questions or concerns.

## 2021-07-13 ENCOUNTER — Other Ambulatory Visit: Payer: Self-pay

## 2021-07-15 ENCOUNTER — Encounter: Payer: Self-pay | Admitting: Oncology

## 2021-07-18 ENCOUNTER — Other Ambulatory Visit: Payer: Self-pay

## 2021-07-18 ENCOUNTER — Ambulatory Visit
Admission: RE | Admit: 2021-07-18 | Discharge: 2021-07-18 | Disposition: A | Discharge status: Home / Self Care | Payer: Medicaid Other | Source: Ambulatory Visit | Attending: Transplant Hepatology | Admitting: Transplant Hepatology

## 2021-07-18 DIAGNOSIS — K7581 Nonalcoholic steatohepatitis (NASH): Secondary | ICD-10-CM | POA: Insufficient documentation

## 2021-07-18 DIAGNOSIS — Z944 Liver transplant status: Secondary | ICD-10-CM | POA: Diagnosis not present

## 2021-07-18 DIAGNOSIS — F411 Generalized anxiety disorder: Secondary | ICD-10-CM | POA: Diagnosis not present

## 2021-07-18 DIAGNOSIS — K7460 Unspecified cirrhosis of liver: Secondary | ICD-10-CM | POA: Insufficient documentation

## 2021-07-18 DIAGNOSIS — F3181 Bipolar II disorder: Secondary | ICD-10-CM | POA: Diagnosis not present

## 2021-07-18 DIAGNOSIS — F4310 Post-traumatic stress disorder, unspecified: Secondary | ICD-10-CM | POA: Diagnosis not present

## 2021-07-18 DIAGNOSIS — H9209 Otalgia, unspecified ear: Secondary | ICD-10-CM

## 2021-07-18 NOTE — Telephone Encounter (Signed)
Please refer patient to [facility]

## 2021-07-20 ENCOUNTER — Encounter: Payer: Self-pay | Admitting: Psychiatry

## 2021-07-20 ENCOUNTER — Ambulatory Visit (INDEPENDENT_AMBULATORY_CARE_PROVIDER_SITE_OTHER): Payer: Medicaid Other | Admitting: Psychiatry

## 2021-07-20 ENCOUNTER — Other Ambulatory Visit: Payer: Self-pay

## 2021-07-20 VITALS — BP 118/81 | HR 79 | Temp 98.1°F | Wt 253.4 lb

## 2021-07-20 DIAGNOSIS — F4310 Post-traumatic stress disorder, unspecified: Secondary | ICD-10-CM

## 2021-07-20 DIAGNOSIS — G4700 Insomnia, unspecified: Secondary | ICD-10-CM

## 2021-07-20 DIAGNOSIS — Z8572 Personal history of non-Hodgkin lymphomas: Secondary | ICD-10-CM | POA: Diagnosis not present

## 2021-07-20 DIAGNOSIS — G2111 Neuroleptic induced parkinsonism: Secondary | ICD-10-CM

## 2021-07-20 DIAGNOSIS — K1122 Acute recurrent sialoadenitis: Secondary | ICD-10-CM | POA: Diagnosis not present

## 2021-07-20 DIAGNOSIS — R682 Dry mouth, unspecified: Secondary | ICD-10-CM | POA: Diagnosis not present

## 2021-07-20 DIAGNOSIS — F411 Generalized anxiety disorder: Secondary | ICD-10-CM | POA: Diagnosis not present

## 2021-07-20 DIAGNOSIS — F3178 Bipolar disorder, in full remission, most recent episode mixed: Secondary | ICD-10-CM | POA: Diagnosis not present

## 2021-07-20 NOTE — Progress Notes (Signed)
[place] MD OP Progress Note  07/20/2021 12:24 PM Alison [name]  MRN:  [id number]  Chief Complaint:  Chief Complaint   Follow-up; Insomnia; Depression    HPI: Alison [name] is a 45 year old Caucasian female, lives in [place], has a history of bipolar disorder, PTSD, multiple medical problems including fibromyalgia, chronic pain, trigeminal neuralgia, history of liver transplant, B-cell lymphoma, SLE, diabetes melitis, OSA, migraine headache was evaluated in office today.  Patient with history of lymphoma involving bilateral parotid gland status post  weekly cycles of Rituxan in 2020.  She currently has swelling of her right sided area of cheek around her ear.  She has been referred to [facility] and has upcoming appointment.  Patient reports mood swings are more stable.  Although she has ups and downs in her mood she has been functioning okay on the current medication regimen.  She however struggles with sleep.  She had sleep study completed recently and is awaiting report.  She also reports external stressors, her fianc has an alarm set that goes off very frequently starting 3 AM and that kind of keeps her awake.  She also reports even when she takes her Seroquel although she falls asleep around 8 PM she wakes up at around 11 PM and stays awake for several hours.  And because of the fianc's alarm clock going off after 3 AM until 6 AM or so she is unable to stay asleep since sleep is interrupted.  Patient does have observed apneic episodes and used to be on CPAP in the past.  She hence is awaiting the sleep study results to see if she needs to be fitted with a CPAP mask again.  Patient denies any suicidality, homicidality or perceptual disturbances.  Patient denies any other concerns today.  Visit Diagnosis:    ICD-10-CM   1. Bipolar disorder, in full remission, most recent episode mixed ([place])  F31.78     2. PTSD (post-traumatic stress disorder)  F43.10     3. GAD (generalized anxiety  disorder)  F41.1     4. 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   Chemo tx's   Migraine    Morbid obesity (HCC)    Neuromuscular disorder (HCC)    neuropathy   Neuropathy    Personality disorder ([place])    Post herpetic neuralgia    PTSD (post-traumatic stress disorder)    Renal disorder    S/P liver transplant [facility])     Past Surgical History:  Procedure Laterality Date   BONE MARROW BIOPSY  01/13/2015   BREAST BIOPSY  12/2014  BREAST BIOPSY  2011   BREAST SURGERY     CHOLECYSTECTOMY     COLONOSCOPY     ESOPHAGOGASTRODUODENOSCOPY     ESOPHAGOGASTRODUODENOSCOPY (EGD) WITH PROPOFOL N/A 01/09/2021   Procedure: ESOPHAGOGASTRODUODENOSCOPY (EGD) WITH PROPOFOL;  Surgeon: [name], MD;  Location: [facility] ENDOSCOPY;  Service: Endoscopy;  Laterality: N/A;   HERNIA REPAIR     infusaport     LIVER TRANSPLANT  12/17/1991   LUMBAR PUNCTURE     PORT A CATH INJECTION ([place] HX)     tumor removal  2015    Family Psychiatric History: Reviewed family psychiatric history from progress note on 06/10/2019  Family History:  Family History  Problem Relation Age of Onset   Heart disease Mother    Hypertension Mother    Cancer -  Other Mother    Bipolar disorder Mother    Heart disease Father    Hypertension Father    Diabetes Father    Parkinson's disease Maternal Grandmother    Cancer Maternal Aunt    Cancer Maternal Uncle    Cancer Maternal Grandfather    Lupus Paternal Grandmother    Hypertension Brother     Social History: Reviewed social history from progress note on 06/10/2019 Social History   Socioeconomic History   Marital status: Single    Spouse name: Not on file   Number of children: 0   Years of education: 9   Highest education level: GED or equivalent  Occupational History   Occupation: disabled  Tobacco Use   Smoking status: Never   Smokeless tobacco: Never  Vaping Use   Vaping Use: Never used  Substance and Sexual Activity   Alcohol use: Not Currently   Drug use: Not Currently    Types: Marijuana    Comment: pain managment last used in early april   Sexual activity: Not Currently  Other Topics Concern   Not on file  Social History Narrative   Not on file   Social Determinants of Health   Financial Resource Strain: Medium Risk   Difficulty of Paying Living Expenses: Somewhat hard  Food Insecurity: No Food Insecurity   Worried About Charity fundraiser in the Last Year: Never true   Ran Out of Food in the Last Year: Never true  Transportation Needs: No Transportation Needs   Lack of Transportation (Medical): No   Lack of Transportation (Non-Medical): No  Physical Activity: Insufficiently Active   Days of Exercise per Week: 1 day   Minutes of Exercise per Session: 30 min  Stress: Stress Concern Present   Feeling of Stress : Rather much  Social Connections: Moderately Integrated   Frequency of Communication with Friends and Family: More than three times a week   Frequency of Social Gatherings with Friends and Family: More than three times a week   Attends Religious Services: More than 4 times per year   Active Member of [employer] or Organizations: No   Attends Theatre manager Meetings: Never   Marital Status: Living with partner    Allergies:  Allergies  Allergen Reactions   Penicillin G Itching, Rash, Anaphylaxis and Palpitations   Lamictal [Lamotrigine] Rash   Carbamazepine Other (See Comments)    Medication interaction-prograf   Hydrocodone-Acetaminophen Itching   Naproxen Itching    Metabolic Disorder Labs: Lab Results  Component Value Date   HGBA1C 8.9 (A) 12/11/2020   MPG 209 06/05/2020   Lab Results  Component Value Date   PROLACTIN 13.4 07/15/2019   PROLACTIN 21.3  05/24/2019   Lab Results  Component Value Date   CHOL 219 (H) 12/11/2020   TRIG 333 (H) 12/11/2020   HDL 38 (L) 12/11/2020   CHOLHDL 5.8 (H) 12/11/2020   LDLCALC 133 (H) 12/11/2020   LDLCALC 111 (H) 06/05/2020   Lab Results  Component Value Date   TSH 2.650 07/15/2019   TSH 4.650 (H) 05/24/2019    Therapeutic Level Labs: No results found for: LITHIUM No results found for: VALPROATE No components found for:  CBMZ  Current Medications: Current Outpatient Medications  Medication Sig Dispense Refill   baclofen (LIORESAL) 10 MG tablet Take 10 mg by mouth 2 (two) times daily.     benztropine (COGENTIN) 1 MG tablet Take 1 tablet (1 mg total) by mouth daily as needed for tremors. 30 tablet 1   Dulaglutide (TRULICITY) 3.84 YK/5.9DJ SOPN Inject 0.75 mg into the skin once a week. (Patient taking differently: Inject 0.75 mg into the skin every Wednesday.) 3 mL 3   ergocalciferol (VITAMIN D2) 1.25 MG (50000 UT) capsule Take 1 capsule by mouth once a week.     ezetimibe (ZETIA) 10 MG tablet Take 10 mg by mouth daily.     glucose blood (COOL BLOOD GLUCOSE TEST STRIPS) test strip Use as instructed 100 each 12   hydrOXYzine (VISTARIL) 25 MG capsule Take by mouth.     icosapent Ethyl (VASCEPA) 1 g capsule Take by mouth.     lisinopril (ZESTRIL) 20 MG tablet Take 20 mg by mouth daily.     Magnesium 500 MG CAPS Take by mouth.     Melatonin 10 MG TABS Take 10 mg by  mouth at bedtime as needed (sleep).     metFORMIN (GLUCOPHAGE) 1000 MG tablet Take 1 tablet (1,000 mg total) by mouth 2 (two) times daily with a meal. 180 tablet 3   mirabegron ER (MYRBETRIQ) 50 MG TB24 tablet Take 1 tablet (50 mg total) by mouth daily. 90 tablet 3   Multiple Vitamin (MULTI-VITAMIN) tablet Take 1 tablet by mouth daily.     ondansetron (ZOFRAN ODT) 4 MG disintegrating tablet Take 1 tablet (4 mg total) by mouth every 8 (eight) hours as needed for nausea or vomiting. 20 tablet 0   oxyCODONE ER (XTAMPZA ER) 13.5 MG C12A Take 1 tablet by mouth 2 (two) times daily.     pregabalin (LYRICA) 200 MG capsule Take 200 mg by mouth 2 (two) times daily.     promethazine (PHENERGAN) 25 MG tablet Take 1 tablet (25 mg total) by mouth as needed. 15 tablet 0   QUEtiapine (SEROQUEL XR) 300 MG 24 hr tablet Take 1 tablet (300 mg total) by mouth at bedtime. 90 tablet 0   QUEtiapine (SEROQUEL XR) 50 MG TB24 24 hr tablet Take 1 tablet (50 mg total) by mouth at bedtime. Take along with 300 mg at bedtime 90 tablet 0   tacrolimus (PROGRAF) 1 MG capsule Take 1 capsule by mouth once daily (Patient taking differently: Take 1 mg by mouth 1 day or 1 dose.) 30 capsule 0   venlafaxine XR (EFFEXOR-XR) 37.5 MG 24 hr capsule TAKE ONE CAPSULE BY MOUTH DAILY WITH BREAKFAST 90 capsule 0   Current Facility-Administered Medications  Medication Dose Route Frequency Provider Last Rate Last Admin   sulfamethoxazole-trimethoprim (BACTRIM DS) 800-160 MG per tablet 1 tablet  1 tablet Oral Q12H Hollice Espy, MD   1 tablet at 01/17/21 1432     Musculoskeletal: Strength & Muscle Tone: within normal limits Gait & Station: normal  Patient leans: N/A  Psychiatric Specialty Exam: Review of Systems  HENT:  Positive for facial swelling (rt.sided).   Psychiatric/Behavioral:  Positive for sleep disturbance.        Mood swings  All other systems reviewed and are negative.  Blood pressure 118/81, pulse 79, temperature 98.1 F  (36.7 C), temperature source Temporal, weight 253 lb 6.4 oz (114.9 kg), last menstrual period 02/04/2018.Body mass index is 40.9 kg/m.  General Appearance: Casual  Eye Contact:  Good  Speech:  Clear and Coherent  Volume:  Normal  Mood:   mood swings - improving  Affect:  Appropriate  Thought Process:  Goal Directed and Descriptions of Associations: Intact  Orientation:  Full (Time, Place, and Person)  Thought Content: Logical   Suicidal Thoughts:  No  Homicidal Thoughts:  No  Memory:  Immediate;   Fair Recent;   Fair Remote;   Fair  Judgement:  Fair  Insight:  Fair  Psychomotor Activity:  Normal  Concentration:  Concentration: Fair and Attention Span: Fair  Recall:  [employer] of Knowledge: Fair  Language: Fair  Akathisia:  No  Handed:  Right  AIMS (if indicated): done  Assets:  Communication Skills Desire for Improvement Housing Social Support  ADL's:  Intact  Cognition: WNL  Sleep:  Poor   Screenings: AIMS    Flowsheet Row Video Visit from 05/09/2021 in [place] Total Score 0      GAD-7    Flowsheet Row Video Visit from 05/28/2021 in [place] Video Visit from 05/09/2021 in [place] Visit from 12/04/2020 in [facility]  Total GAD-7 Score 9 9 5       PHQ2-9    [place] Visit from 07/20/2021 in [place] [facility] from 03/28/2021 in [place] Video Visit from 02/07/2021 in [place] Video Visit from 12/19/2020 in [place] Office Visit from 12/11/2020 in [facility]  PHQ-2 Total Score 3 3 5 1 3   PHQ-9 Total Score 15 18 15  -- 5      [place] Office Visit from 07/20/2021 in [place] [facility] from 03/28/2021 in [place] ED from 01/18/2021 in  [place] No Risk Error: Question 6 not populated No Risk        Assessment and Plan: Alison [name] is a 45 year old Caucasian female on disability, has a history of bipolar disorder, PTSD, history of borderline personality disorder, chronic pain, fibromyalgia, trigeminal neuralgia, history of liver transplant, B-cell lymphoma, SLE, diabetes melitis, OSA, migraine headaches, recent right sided swelling of face, was evaluated in office today.  Patient continues to struggle with sleep, completed sleep study. Mood swings are stable on current medication regimen, will benefit from the following plan.  Plan Bipolar disorder mixed in remission Seroquel extended release 350 mg p.o. nightly Venlafaxine extended release 37.5 mg p.o. daily  GAD-improving Venlafaxine extended release 37.5 mg p.o. daily  Neuroleptic induced Parkinson's disease-stable Cogentin 1 mg p.o. daily as needed  PTSD-stable Will monitor closely.  Continue venlafaxine as prescribed  Insomnia unspecified-unstable Patient will benefit from working on sleep hygiene, sleeping in a separate room since there are external noises that keeps her awake, wearing earplugs, have a set bedtime and a wake-up time.  She is also awaiting sleep study and probably has sleep apnea, will benefit from CPAP. We will  consider adding a sleep medication in the future.  Reviewed notes per Dr. Humberto Leep recent dated 07/18/2021-patient has been referred to [facility].  Follow-up in clinic in 4 to 6 weeks in person.  This note was generated in part or whole with voice recognition software. Voice recognition is usually quite accurate but there are transcription errors that can and very often do occur. I apologize for any typographical errors that were not detected and corrected.     [name], MD 07/20/2021, 12:24 PM

## 2021-07-28 LAB — HM DIABETES EYE EXAM

## 2021-07-31 ENCOUNTER — Encounter: Payer: Self-pay | Admitting: Oncology

## 2021-07-31 ENCOUNTER — Inpatient Hospital Stay (HOSPITAL_BASED_OUTPATIENT_CLINIC_OR_DEPARTMENT_OTHER): Payer: Medicaid Other | Admitting: Oncology

## 2021-07-31 ENCOUNTER — Inpatient Hospital Stay: Payer: Medicaid Other | Attending: Oncology

## 2021-07-31 ENCOUNTER — Other Ambulatory Visit: Payer: Self-pay

## 2021-07-31 VITALS — BP 105/70 | HR 76 | Temp 98.2°F | Resp 20 | Wt 255.6 lb

## 2021-07-31 DIAGNOSIS — Z7984 Long term (current) use of oral hypoglycemic drugs: Secondary | ICD-10-CM | POA: Diagnosis not present

## 2021-07-31 DIAGNOSIS — D696 Thrombocytopenia, unspecified: Secondary | ICD-10-CM | POA: Diagnosis not present

## 2021-07-31 DIAGNOSIS — Z08 Encounter for follow-up examination after completed treatment for malignant neoplasm: Secondary | ICD-10-CM

## 2021-07-31 DIAGNOSIS — Z8572 Personal history of non-Hodgkin lymphomas: Secondary | ICD-10-CM

## 2021-07-31 DIAGNOSIS — Z79899 Other long term (current) drug therapy: Secondary | ICD-10-CM | POA: Insufficient documentation

## 2021-07-31 DIAGNOSIS — Z9221 Personal history of antineoplastic chemotherapy: Secondary | ICD-10-CM | POA: Diagnosis not present

## 2021-07-31 LAB — CBC WITH DIFFERENTIAL/PLATELET
Abs Immature Granulocytes: 0.02 10*3/uL (ref 0.00–0.07)
Basophils Absolute: 0 10*3/uL (ref 0.0–0.1)
Basophils Relative: 1 %
Eosinophils Absolute: 0.3 10*3/uL (ref 0.0–0.5)
Eosinophils Relative: 6 %
HCT: 40.8 % (ref 36.0–46.0)
Hemoglobin: 13.3 g/dL (ref 12.0–15.0)
Immature Granulocytes: 0 %
Lymphocytes Relative: 33 %
Lymphs Abs: 1.7 10*3/uL (ref 0.7–4.0)
MCH: 29.8 pg (ref 26.0–34.0)
MCHC: 32.6 g/dL (ref 30.0–36.0)
MCV: 91.5 fL (ref 80.0–100.0)
Monocytes Absolute: 0.4 10*3/uL (ref 0.1–1.0)
Monocytes Relative: 8 %
Neutro Abs: 2.6 10*3/uL (ref 1.7–7.7)
Neutrophils Relative %: 52 %
Platelets: 113 10*3/uL — ABNORMAL LOW (ref 150–400)
RBC: 4.46 MIL/uL (ref 3.87–5.11)
RDW: 13.2 % (ref 11.5–15.5)
WBC: 5.1 10*3/uL (ref 4.0–10.5)
nRBC: 0 % (ref 0.0–0.2)

## 2021-07-31 LAB — COMPREHENSIVE METABOLIC PANEL
ALT: 37 U/L (ref 0–44)
AST: 36 U/L (ref 15–41)
Albumin: 4 g/dL (ref 3.5–5.0)
Alkaline Phosphatase: 60 U/L (ref 38–126)
Anion gap: 7 (ref 5–15)
BUN: 20 mg/dL (ref 6–20)
CO2: 25 mmol/L (ref 22–32)
Calcium: 9.1 mg/dL (ref 8.9–10.3)
Chloride: 102 mmol/L (ref 98–111)
Creatinine, Ser: 0.93 mg/dL (ref 0.44–1.00)
GFR, Estimated: >60 mL/min (ref >60)
Glucose, Bld: 144 mg/dL — ABNORMAL HIGH (ref 70–99)
Potassium: 4.6 mmol/L (ref 3.5–5.1)
Sodium: 134 mmol/L — ABNORMAL LOW (ref 135–145)
Total Bilirubin: 0.5 mg/dL (ref 0.3–1.2)
Total Protein: 7.3 g/dL (ref 6.5–8.1)

## 2021-07-31 NOTE — Progress Notes (Signed)
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  She is currently on CellCept and tacrolimus for the same.  She also has a history of post transplant lymphoproliferative disorder/DLBCL that was diagnosed in 2016.  She is s/p 6 cycles of R-CHOP chemotherapy back then and was in complete remission 1.  Her other past medical history significant for migraines, stage III chronic kidney disease, chemo-induced peripheral neuropathy, hypertension among other medical problems.  With regards to her diffuse large B-cell lymphoma she was getting surveillance scans and this was all done in [place].  She has now moved to [facility] to be with her significant other.  Patient was noted to have a prior kidney mass before which was biopsied and was not consistent with malignancy.   She underwent CT neck with contrast before she left [place] on 02/22/2019.  She was noted to have soft tissue masses in both the parotid glands which were new as compared to prior exams and possibly represent lymph nodes.  The largest one was seen in the left parotid gland measuring 1.3 x 1.1 cm.  Before she could get a work-up for this patient moved to [place] and has not had any further work-up yet   Her other prior imaging is as follows: MRI thoracic spine without contrast in August 2019 showed moderate multilevel spondylosis but no evidence of lymphoma.  Multinodular thyroid in June 2019.  MRI cervical spine May 2019 again showed spondylosis but no other acute pathology.  MRI brain showed incidental partial opacification of the right and right sinus.  No evidence of malignancy.  I do not have any other PET CT scan or treatment records from the past.   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  Bone marrow biopsy was negative for lymphoma.  Patient completed 4 cycles of weekly RituxanIn September 2020.  Repeat PET CT scan showed interval resolution of the parotid lesions compatible with complete response to therapy    Interval history-patient was complaining of waxing and waning swelling under her right ear.  She was seen by ENT.  Given her history of sicca syndrome she was also given artificial saliva tablets which have been helping.  Swelling has subsided.  Appetite and weight have been relatively stable.  ECOG PS- 1 Pain scale- 0   Review of systems- Review of Systems  Constitutional:  Negative for chills, fever, malaise/fatigue and weight loss.  HENT:  Negative for congestion, ear discharge and nosebleeds.   Eyes:  Negative for blurred vision.  Respiratory:  Negative for cough, hemoptysis, sputum production, shortness of breath and wheezing.   Cardiovascular:  Negative for chest pain, palpitations, orthopnea and claudication.  Gastrointestinal:  Negative for abdominal pain, blood in stool, constipation, diarrhea, heartburn, melena, nausea and vomiting.  Genitourinary:  Negative for dysuria, flank pain, frequency, hematuria and urgency.  Musculoskeletal:  Negative for back pain, joint pain and myalgias.  Skin:  Negative for rash.  Neurological:  Negative for dizziness, tingling, focal  weakness, seizures, weakness and headaches.  Endo/Heme/Allergies:  Does not bruise/bleed easily.  Psychiatric/Behavioral:  Negative for depression and suicidal ideas. The patient does not have insomnia.       Allergies  Allergen Reactions   Penicillin G Itching, Rash, Anaphylaxis and Palpitations   Lamictal [Lamotrigine] Rash   Carbamazepine Other (See Comments)    Medication interaction-prograf   Hydrocodone-Acetaminophen Itching   Naproxen Itching     Past Medical History:  Diagnosis Date   Abnormal uterine bleeding    Allergy    Anxiety    Arthritis    Bipolar disorder (manic depression) (HCC)    Chronic kidney failure    Chronic renal disease, stage III (HCC)    Diabetes mellitus without complication (HCC)    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[place]) 2015   Right axillary lymph node resected and chemo tx's.   FH: trigeminal neuralgia    GERD (gastroesophageal reflux disease)    Heart murmur    Hypertension    Kidney mass    Lupus (HCC)    Lupus (HCC)    Major depressive disorder    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[place]) 06/2019   Chemo tx's   Migraine    Morbid obesity (HCC)    Neuromuscular disorder (HCC)    neuropathy   Neuropathy    Personality disorder ([place])    Post herpetic neuralgia    PTSD (post-traumatic stress disorder)    Renal disorder    S/P liver transplant [facility])      Past Surgical History:  Procedure Laterality Date   BONE MARROW BIOPSY  01/13/2015   BREAST BIOPSY  12/2014   BREAST BIOPSY  2011   BREAST SURGERY     CHOLECYSTECTOMY     COLONOSCOPY     ESOPHAGOGASTRODUODENOSCOPY     ESOPHAGOGASTRODUODENOSCOPY (EGD) WITH PROPOFOL N/A 01/09/2021   Procedure: ESOPHAGOGASTRODUODENOSCOPY (EGD) WITH PROPOFOL;  Surgeon: [name], MD;  Location: [facility] ENDOSCOPY;  Service: Endoscopy;  Laterality: N/A;   HERNIA REPAIR     infusaport     LIVER TRANSPLANT  12/17/1991   LUMBAR PUNCTURE     PORT A CATH INJECTION ([place] HX)     tumor removal  2015     Social History   Socioeconomic History   Marital status: Single    Spouse name: Not on file   Number of children: 0   Years of education: 9   Highest education level: GED or equivalent  Occupational History   Occupation: disabled  Tobacco Use   Smoking status: Never   Smokeless tobacco: Never  Vaping Use   Vaping Use: Never used  Substance and Sexual Activity   Alcohol use: Not Currently   Drug use: Not Currently    Types: Marijuana    Comment: pain managment last used in early april   Sexual activity: Not Currently  Other Topics Concern   Not on file  Social History Narrative   Not on file   Social Determinants of Health   Financial Resource Strain: Medium Risk   Difficulty of Paying Living Expenses: Somewhat hard  Food Insecurity: No Food Insecurity   Worried About Charity fundraiser in the Last Year: Never true   Ran Out of Food in the Last Year: Never true  Transportation Needs: No  Transportation Needs   Lack of Transportation (Medical): No   Lack of Transportation (Non-Medical): No  Physical Activity: Insufficiently Active   Days of Exercise per Week: 1 day   Minutes of Exercise per Session: 30 min  Stress: Stress Concern Present   Feeling of Stress : Rather much  Social Connections: Moderately Integrated   Frequency of Communication with Friends and Family: More than three times a week   Frequency of Social Gatherings with Friends and Family: More than three times a week   Attends Religious Services: More than 4 times per year   Active Member of [employer] or Organizations: No   Attends Music therapist: Never   Marital Status: Living with partner  Human resources officer Violence: Not At Risk   Fear of Current or Ex-Partner: No   Emotionally Abused: No   Physically Abused: No   Sexually Abused: No    Family History  Problem Relation Age of Onset   Heart disease Mother    Hypertension Mother    Cancer - Other Mother    Bipolar disorder Mother     Heart disease Father    Hypertension Father    Diabetes Father    Parkinson's disease Maternal Grandmother    Cancer Maternal Aunt    Cancer Maternal Uncle    Cancer Maternal Grandfather    Lupus Paternal Grandmother    Hypertension Brother      Current Outpatient Medications:    Accu-Chek FastClix Lancets MISC, Apply topically 2 (two) times daily., Disp: , Rfl:    baclofen (LIORESAL) 10 MG tablet, Take 10 mg by mouth 2 (two) times daily., Disp: , Rfl:    benztropine (COGENTIN) 1 MG tablet, Take 1 tablet (1 mg total) by mouth daily as needed for tremors., Disp: 30 tablet, Rfl: 1   Dulaglutide (TRULICITY) 4.94 WH/6.7RF SOPN, Inject 0.75 mg into the skin once a week. (Patient taking differently: Inject 0.75 mg into the skin every Wednesday.), Disp: 3 mL, Rfl: 3   ergocalciferol (VITAMIN D2) 1.25 MG (50000 UT) capsule, Take 1 capsule by mouth once a week., Disp: , Rfl:    ezetimibe (ZETIA) 10 MG tablet, Take 10 mg by mouth daily., Disp: , Rfl:    glucose blood (COOL BLOOD GLUCOSE TEST STRIPS) test strip, Use as instructed, Disp: 100 each, Rfl: 12   hydrOXYzine (VISTARIL) 25 MG capsule, Take by mouth., Disp: , Rfl:    icosapent Ethyl (VASCEPA) 1 g capsule, Take by mouth., Disp: , Rfl:    lisinopril (ZESTRIL) 20 MG tablet, Take 20 mg by mouth daily., Disp: , Rfl:    Magnesium 500 MG CAPS, Take by mouth., Disp: , Rfl:    Melatonin 10 MG TABS, Take 10 mg by mouth at bedtime as needed (sleep)., Disp: , Rfl:    metFORMIN (GLUCOPHAGE) 1000 MG tablet, Take 1 tablet (1,000 mg total) by mouth 2 (two) times daily with a meal., Disp: 180 tablet, Rfl: 3   mirabegron ER (MYRBETRIQ) 50 MG TB24 tablet, Take 1 tablet (50 mg total) by mouth daily., Disp: 90 tablet, Rfl: 3   Multiple Vitamin (MULTI-VITAMIN) tablet, Take 1 tablet by mouth daily., Disp: , Rfl:    ondansetron (ZOFRAN ODT) 4 MG disintegrating tablet, Take 1 tablet (4 mg total) by mouth every 8 (eight) hours as needed for nausea or vomiting.,  Disp: 20 tablet, Rfl: 0   oxyCODONE ER (XTAMPZA ER) 13.5 MG C12A, Take 1 tablet by mouth 2 (two) times daily., Disp: , Rfl:  pregabalin (LYRICA) 200 MG capsule, Take 200 mg by mouth 2 (two) times daily., Disp: , Rfl:    promethazine (PHENERGAN) 25 MG tablet, Take 1 tablet (25 mg total) by mouth as needed., Disp: 15 tablet, Rfl: 0   QUEtiapine (SEROQUEL XR) 300 MG 24 hr tablet, Take 1 tablet (300 mg total) by mouth at bedtime., Disp: 90 tablet, Rfl: 0   QUEtiapine (SEROQUEL XR) 50 MG TB24 24 hr tablet, Take 1 tablet (50 mg total) by mouth at bedtime. Take along with 300 mg at bedtime, Disp: 90 tablet, Rfl: 0   tacrolimus (PROGRAF) 1 MG capsule, Take 1 capsule by mouth once daily (Patient taking differently: Take 1 mg by mouth 1 day or 1 dose.), Disp: 30 capsule, Rfl: 0   venlafaxine XR (EFFEXOR-XR) 37.5 MG 24 hr capsule, TAKE ONE CAPSULE BY MOUTH DAILY WITH BREAKFAST, Disp: 90 capsule, Rfl: 0   XYLIMELTS 550 MG DISK, USE one BY MOUTH EVERY 8 HOURS AS NEEDED FOR DRY MOUTH, Disp: , Rfl:   Current Facility-Administered Medications:    sulfamethoxazole-trimethoprim (BACTRIM DS) 800-160 MG per tablet 1 tablet, 1 tablet, Oral, Q12H, Hollice Espy, MD, 1 tablet at 01/17/21 1432  Physical exam:  Vitals:   07/31/21 1115  BP: 105/70  Pulse: 76  Resp: 20  Temp: 98.2 F (36.8 C)  SpO2: 96%  Weight: 255 lb 9.6 oz (115.9 kg)   Physical Exam Constitutional:      General: She is not in acute distress. Cardiovascular:     Rate and Rhythm: Normal rate and regular rhythm.     Heart sounds: Normal heart sounds.  Pulmonary:     Effort: Pulmonary effort is normal.     Breath sounds: Normal breath sounds.  Abdominal:     General: Bowel sounds are normal.     Palpations: Abdomen is soft.  Lymphadenopathy:     Comments: No palpable cervical, supraclavicular, axillary or inguinal adenopathy    Skin:    General: Skin is warm and dry.  Neurological:     Mental Status: She is alert and oriented to  person, place, and time.     CMP Latest Ref Rng & Units 07/31/2021  Glucose 70 - 99 mg/dL 144(H)  BUN 6 - 20 mg/dL 20  Creatinine 0.44 - 1.00 mg/dL 0.93  Sodium 135 - 145 mmol/L 134(L)  Potassium 3.5 - 5.1 mmol/L 4.6  Chloride 98 - 111 mmol/L 102  CO2 22 - 32 mmol/L 25  Calcium 8.9 - 10.3 mg/dL 9.1  Total Protein 6.5 - 8.1 g/dL 7.3  Total Bilirubin 0.3 - 1.2 mg/dL 0.5  Alkaline Phos 38 - 126 U/L 60  AST 15 - 41 U/L 36  ALT 0 - 44 U/L 37   CBC Latest Ref Rng & Units 07/31/2021  WBC 4.0 - 10.5 K/uL 5.1  Hemoglobin 12.0 - 15.0 g/dL 13.3  Hematocrit 36.0 - 46.0 % 40.8  Platelets 150 - 400 K/uL 113(L)    No images are attached to the encounter.  US Abdomen Limited RUQ (LIVER/GB)  Result Date: 07/19/2021 CLINICAL DATA:  Liver cirrhosis.  History of prior cholecystectomy. EXAM: ULTRASOUND ABDOMEN LIMITED RIGHT UPPER QUADRANT COMPARISON:  January 29, 2021 FINDINGS: Gallbladder: The gallbladder is surgically absent. Common bile duct: Diameter: 6.3 mm Liver: No focal lesion identified. The liver parenchyma is coarse in echotexture and diffusely increased in echogenicity. Portal vein is patent on color Doppler imaging with normal direction of blood flow towards the liver. Other: None. IMPRESSION: 1. Evidence of  prior cholecystectomy. 2. Stable findings consistent with hepatic cirrhosis. Electronically Signed   By: Virgina Norfolk M.D.   On: 07/19/2021 23:36   SLEEP STUDY DOCUMENTS  Result Date: 07/16/2021 Ordered by an unspecified provider.    Assessment and plan- Patient is a 45 y.o. 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  She is here for routine follow-up  Clinically patient is doing well with no concerning signs and symptoms of recurrence based on today's exam.  For her lymphoma follow-up she does not require any routine surveillance imaging at this  time and I will see her back in 6 months with CBC with differential CMP and LDH.  She does have some baseline mild thrombocytopenia which waxes and wanes but overall stable we will continue to monitor.   Visit Diagnosis 1. Encounter for follow-up surveillance of lymphoma   2. Thrombocytopenia ([place])      Dr. Randa Evens, MD, MPH [facility] at [facility] [phone] 07/31/2021 4:19 PM

## 2021-08-01 ENCOUNTER — Other Ambulatory Visit: Payer: Self-pay | Admitting: Family Medicine

## 2021-08-01 DIAGNOSIS — Z1231 Encounter for screening mammogram for malignant neoplasm of breast: Secondary | ICD-10-CM

## 2021-08-01 LAB — KAPPA/LAMBDA LIGHT CHAINS
Kappa free light chain: 38.3 mg/L — ABNORMAL HIGH (ref 3.3–19.4)
Kappa, lambda light chain ratio: 1.33 (ref 0.26–1.65)
Lambda free light chains: 28.7 mg/L — ABNORMAL HIGH (ref 5.7–26.3)

## 2021-08-02 ENCOUNTER — Encounter: Payer: Self-pay | Admitting: Oncology

## 2021-08-02 LAB — MULTIPLE MYELOMA PANEL, SERUM
Albumin SerPl Elph-Mcnc: 3.7 g/dL (ref 2.9–4.4)
Albumin/Glob SerPl: 1.2 (ref 0.7–1.7)
Alpha 1: 0.2 g/dL (ref 0.0–0.4)
Alpha2 Glob SerPl Elph-Mcnc: 0.9 g/dL (ref 0.4–1.0)
B-Globulin SerPl Elph-Mcnc: 1.1 g/dL (ref 0.7–1.3)
Gamma Glob SerPl Elph-Mcnc: 1.1 g/dL (ref 0.4–1.8)
Globulin, Total: 3.3 g/dL (ref 2.2–3.9)
IgA: 166 mg/dL (ref 87–352)
IgG (Immunoglobin G), Serum: 1015 mg/dL (ref 586–1602)
IgM (Immunoglobulin M), Srm: 350 mg/dL — ABNORMAL HIGH (ref 26–217)
M Protein SerPl Elph-Mcnc: 0.8 g/dL — ABNORMAL HIGH
Total Protein ELP: 7 g/dL (ref 6.0–8.5)

## 2021-08-06 ENCOUNTER — Other Ambulatory Visit: Payer: Self-pay

## 2021-08-06 ENCOUNTER — Other Ambulatory Visit: Payer: Self-pay | Admitting: Obstetrics and Gynecology

## 2021-08-06 ENCOUNTER — Telehealth: Payer: Self-pay | Admitting: Family Medicine

## 2021-08-06 NOTE — Telephone Encounter (Signed)
..   Medicaid Managed Care   Unsuccessful Outreach Note  08/06/2021 Name: Alison [name] MRN: [id number] DOB: 10/30/1975  Referred by: [place] Reason for referral : High Risk Managed Medicaid (Called the patient to get her scheduled for a phone visit with the [facility] [name]. I left my name and number on her VM.)   An unsuccessful telephone outreach was attempted today. The patient was referred to the case management team for assistance with care management and care coordination.   Follow Up Plan: The care management team will reach out to the patient again over the next 7 days.    [place], High Risk Medicaid [place]   .j

## 2021-08-06 NOTE — Patient Outreach (Signed)
Medicaid Managed Care   Nurse Care Manager Note  08/06/2021 Name:  Alanee Ting MRN:  [id number] DOB:  [birth year]  Aliha Diedrich is an 45 y.o. year old female who is a primary patient of [facility].  The [facility] Managed Care Coordination team was consulted for assistance with:    Chronic healthcare management needs.  Ms. Kaczynski was given information about Medicaid Managed Care Coordination team services today. [name] Patient agreed to services and verbal consent obtained.  Engaged with patient by telephone for follow up visit in response to provider referral for case management and/or care coordination services.   Assessments/Interventions:  Review of past medical history, allergies, medications, health status, including review of consultants reports, laboratory and other test data, was performed as part of comprehensive evaluation and provision of chronic care management services.  SDOH (Social Determinants of Health) assessments and interventions performed: SDOH Interventions    Flowsheet Row Most Recent Value  SDOH Interventions   Financial Strain Interventions Intervention Not Indicated       Care Plan  Allergies  Allergen Reactions   Penicillin G Itching, Rash, Anaphylaxis and Palpitations   Lamictal [Lamotrigine] Rash   Carbamazepine Other (See Comments)    Medication interaction-prograf   Hydrocodone-Acetaminophen Itching   Naproxen Itching    Medications Reviewed Today     Reviewed by [name], RN (Registered Nurse) on 08/06/21 at 109  Med List Status: <None>   Medication Order Taking? Sig Documenting Provider Last Dose Status Informant  Accu-Chek FastClix Lancets MISC [id number] No Apply topically 2 (two) times daily. [provider] Taking Active   baclofen (LIORESAL) 10 MG tablet [id number] No Take 10 mg by mouth 2 (two) times daily. [provider] Taking Active   benztropine (COGENTIN) 1 MG tablet  [id number] No Take 1 tablet (1 mg total) by mouth daily as needed for tremors. [name], MD Taking Active Self  Dulaglutide (TRULICITY) 6.64 QI/3.4VQ SOPN [id number] No Inject 0.75 mg into the skin once a week.  Patient taking differently: Inject 0.75 mg into the skin every Wednesday.   Myles Gip, DO Taking Active   ergocalciferol (VITAMIN D2) 1.25 MG (50000 UT) capsule [id number] No Take 1 capsule by mouth once a week. [provider] Taking Active   ezetimibe (ZETIA) 10 MG tablet [id number] No Take 10 mg by mouth daily. [provider] Taking Active   glucose blood (COOL BLOOD GLUCOSE TEST STRIPS) test strip [id number] No Use as instructed Steele Sizer, MD Taking Active   hydrOXYzine (VISTARIL) 25 MG capsule [id number] No Take by mouth. [provider] Taking Active   icosapent Ethyl (VASCEPA) 1 g capsule [id number] No Take by mouth. [provider] Taking Active   lisinopril (ZESTRIL) 20 MG tablet [id number] No Take 20 mg by mouth daily. [provider] Taking Active   Magnesium 500 MG CAPS [id number] No Take by mouth. [provider] Taking Active   Melatonin 10 MG TABS [id number] No Take 10 mg by mouth at bedtime as needed (sleep). [provider] Taking Active Self  metFORMIN (GLUCOPHAGE) 1000 MG tablet [id number] No Take 1 tablet (1,000 mg total) by mouth 2 (two) times daily with a meal. Myles Gip, DO Taking Active Self  mirabegron ER (MYRBETRIQ) 50 MG TB24 tablet [id number] No Take 1 tablet (50 mg total) by mouth daily. Debroah Loop, PA-C Taking Active Self  Multiple Vitamin (MULTI-VITAMIN) tablet [id number] No Take 1 tablet by mouth daily. [provider] Taking Active  Self  ondansetron (ZOFRAN ODT) 4 MG disintegrating tablet [id number] No Take 1 tablet (4 mg total) by mouth every 8 (eight) hours as needed for nausea or vomiting. Paulette Blanch, MD Taking Active   oxyCODONE ER [facility] ER) 13.5 MG  C12A [id number] No Take 1 tablet by mouth 2 (two) times daily. [provider] Taking Active   pregabalin (LYRICA) 200 MG capsule [id number] No Take 200 mg by mouth 2 (two) times daily. [provider] Taking Active   promethazine (PHENERGAN) 25 MG tablet [id number] No Take 1 tablet (25 mg total) by mouth as needed. Myles Gip, DO Taking Active Self  QUEtiapine (SEROQUEL XR) 300 MG 24 hr tablet [id number] No Take 1 tablet (300 mg total) by mouth at bedtime. [name], MD Taking Active   QUEtiapine (SEROQUEL XR) 50 MG TB24 24 hr tablet [id number] No Take 1 tablet (50 mg total) by mouth at bedtime. Take along with 300 mg at bedtime [name], MD Taking Active   sulfamethoxazole-trimethoprim (BACTRIM DS) 800-160 MG per tablet 1 tablet [id number]   Hollice Espy, MD  Active   tacrolimus (PROGRAF) 1 MG capsule [id number] No Take 1 capsule by mouth once daily  Patient taking differently: Take 1 mg by mouth 1 day or 1 dose.   [name], FNP Taking Active Self           Med Note [name], [name]   Fri Jan 19, 2021  5:01 AM)    venlafaxine XR (EFFEXOR-XR) 37.5 MG 24 hr capsule [id number] No TAKE ONE CAPSULE BY MOUTH DAILY WITH Eldridge Abrahams, MD Taking Active   XYLIMELTS 550 MG DISK [id number] No USE one BY MOUTH EVERY 8 HOURS AS NEEDED FOR DRY MOUTH [provider] Taking Active             Patient Active Problem List   Diagnosis Date Noted   Insomnia 07/20/2021   Bipolar disorder, in full remission, most recent episode mixed ([place]) 07/20/2021   Bipolar 1 disorder, mixed, mild ([place]) 05/09/2021   GAD (generalized anxiety disorder) 05/09/2021   Hepatic cirrhosis ([place])    Opioid dependence with opioid-induced disorder ([place]) 12/20/2020   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   Bipolar disorder, in full remission, most recent episode depressed ([place]) 09/27/2020   Suprapubic pain 07/25/2020   Bipolar 1 disorder,  depressed, mild ([place]) 07/10/2020   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   Edema of lower extremity 09/16/2019   Carpal tunnel syndrome, left 07/26/2019   Cubital tunnel syndrome on left 07/26/2019   Bipolar I disorder, most recent episode depressed ([place]) 07/12/2019   PTSD (post-traumatic stress disorder) 28/76/8115   Umbilical hernia without obstruction and without gangrene 06/17/2019   Encounter for surveillance of abnormal nevi 06/17/2019   Pineal gland cyst 06/17/2019   Chronic hip pain, left 06/02/2019   Lumbar spondylosis 06/02/2019   Mouth dryness 06/02/2019   Obesity (BMI 35.0-39.9 without comorbidity) 06/02/2019   Goals of care, counseling/discussion 05/18/2019   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[place]) 05/18/2019   Occipital neuralgia 05/13/2019   Plantar fasciitis of left foot 04/27/2019   Fibromyalgia 03/23/2019   Systemic lupus erythematosus (SLE) in adult [facility]) 03/23/2019   Essential hypertension 03/23/2019   Hepatitis 03/23/2019   Mass of left kidney 03/23/2019   Diabetes mellitus ([place]) 03/23/2019   Nausea without vomiting 03/23/2019   Neuropathy 03/23/2019   Cancer ([place]) 03/23/2019   Hyperlipidemia  03/23/2019   Osteoarthritis 03/23/2019   Trigeminal neuralgia 03/23/2019   Chronic renal disease, stage III ([place]) 03/23/2019   Nephrolithiasis 03/23/2019   Migraine 03/23/2019   OSA on CPAP 03/23/2019   Fibrocystic breast 03/23/2019   Heart murmur 03/23/2019   Bipolar disorder, in partial remission, most recent episode depressed ([place]) 07/02/2017   Abnormal uterine bleeding 03/18/2017   Major depressive disorder, recurrent ([place]) 03/18/2017   Morbid obesity ([place]) 03/18/2017   Secondary hyperparathyroidism ([place]) 02/07/2017   Neuropathic pain 11/19/2016   Post herpetic neuralgia 11/19/2016   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[place]) 01/13/2015   Lumbar  disc disease with radiculopathy 07/26/2013   S/P liver transplant ([place]) 07/26/2013   Type 2 diabetes mellitus, uncontrolled 07/26/2013   Facial nerve disorder 06/29/2012   Low back pain 12/26/2010    Conditions to be addressed/monitored per PCP order:   chronic healthcare management needs, LBP, DM2, LDD, h/o liver transplant, h/o DLBCLdepression, bipolar, OSA, migraines, osteoarthritis, HLD, SLE, fibromyalgia, chronic pain , CKD, PTSD  Care Plan : Chronic Pain (Adult)  Updates made by [name], RN since 08/06/2021 12:00 AM     Problem: Chronic Pain Management-fibromyalgia   Priority: High  Onset Date: 01/22/2021     Long-Range Goal: Fibromyalgia pain managed-new pain management provider   Start Date: 08/14/2020  Expected End Date: 11/06/2021  Recent Progress: On track  Priority: High  Note:   Current Barriers:  Care Coordination needs related to pain management provider.   Patient is experiencing fibromyalgia flair and is interested in help being connected to a pain management provider. Unable to independently secure referral or appointment for pain management provider. Update 11/24/20:  Patient is being followed by Dr. Humphrey Rolls. Update 07/11/21:  Patient without complaint today-pain managed. Update 08/06/21:  Patient continues to see Dr. Humphrey Rolls and her neurologist-has appointment this week.  C/O headache-refilled pain medicine.  Nurse Case Manager Clinical Goal(s):  Over the next 45 days, patient will work with [facility] to address needs related to referral for pain management provider and associated care coordination needs. Update 09/22/20:  patient is currently seeing Dr. Humphrey Rolls at [facility].  Interventions:  Inter-disciplinary care team collaboration (see longitudinal plan of care) Evaluation of current treatment plan related to fibromyalgia  and patient's adherence to plan as established by provider. Update 02/21/21:  Patient has restarted Cymbalta. Update  04/19/21:  No complaints today. Update 05/15/21:  Patient's Cymbalta and Baclofen stopped by provider, patient will contact provider for alternative as she is having increased symptoms. Update 06/15/21:  Patient taking Baclofen again, Effexor added. Collaborated with primary care provider regarding recommendations and referral to pain management provider. Discussed plans with patient for ongoing care management follow up and provided patient with direct contact information for care management team Anticipate pain education program, pain management support as part of pain management referral.      [name] contacted patient to check in. Patient stated she has not contacted an eye doctor nor has she seen a specialist. Patient states she thinks she has COVID due to her husband having it. She has not taken a test yet, but has some symptoms. She is isolating in the bedroom and they have someone to cook and bring her food.       Update 11/16/20: [name] completed phone call with patient, she states she is still having symptoms from [place] and has been in bed all month. Patient states she has not had a chance to contact an eye  doctor or specialist due to being sick. Update 11/24/20:  Patient with continued fatigue-will follow up with PCP. Update 01/22/21:  patient continues to follow up with provider. Pharmacy referral for medication review. Update 01/22/21:  Patient met with Pharmacist-continuing to follow. Update 12/14/20: [name] completed call with patient, she stated she is waiting on a call from [facility] about getting a liver biopsy completed. She has not reached out to the eye doctor yet because her liver is top priority right now. Update 12/22/20:  Patient seen by PCP 12/11/20.  Has not seen Opthamologist yet.  Had liver biopsy 12/20/20 due to increased liver enzymes.  Update 01/15/21: [name] completed a follow up with patient. She stated she found out she has to have a liver transplant and is currently completing testing. Patient stated  that she has been in pain since her pcp took her off of her pain medication. Patient stated her rent will be going up by $400 and they have to move. They will most likely move to [place] area because her fiance' works in that area. No resources needed at this time. Update 03/20/21:  Pain states no pain at this time.  Has decided to stay in current apartment.  Patient states her liver function tests have improved and she is stable right now. Update 05/15/21:  Encouraged patient to schedule appointment with Ophthalmologist-patient states she will do this. Update 06/15/21:  Patient to schedule an appointment with ophthalmologist-has not done so yet.   Update 07/11/21:  Patient stable today-states she will schedule eye appointment today. Update 08/06/21:  Patient seen and evaluated by eye doctor-needs to get glasses.  Patient Goals/Self-Care Activities Over the next 45 days, patient will:  -Attends all scheduled provider appointments  develop a personal pain management plan with your pain management provider.  Follow Up Plan:  The Managed Medicaid care management team will reach out to the patient again over the next 30 days   Care Plan : Wellness (Adult)  Updates made by [name], RN since 08/06/2021 12:00 AM     Problem: Medication Adherence (Wellness)   Priority: High  Onset Date: 01/22/2021     Long-Range Goal: Medication Adherence Maintained   Start Date: 09/06/2020  Expected End Date: 11/06/2021  Recent Progress: Not on track  Priority: High  Note:   Current Barriers:  Patient states she needs to have prescriptions refilled and feels she has some side effects from her medications. Patient with only 1 vehicle now and having trouble attending appointments-has cancelled 2 appointments recently. Patient with new neck lump-has CT scheduled 04/24/21, ? Lymphoma. Update 12/22/20:  Patient's PCP discontinued Victoza and added Trulicity.  Patient has not been taking mediations as  prescribed.  Hgb A1C 8.1 on 12/11/20. Update 01/22/21:  Patient states she is taking medications.  She checks blood sugar once a day and needs more testing strips.  Blood sugars 200's per patient. Update 02/21/21:  Patient is checking blood sugars 4 times a day.  Has appointment with endocrinologist today and is keeping food diary. Update 03/20/21:  Blood sugars mid 100's perpatient-has appointment with nutritionist 03/28/21. Update 04/20/21:  Blood sugars 220-300 per patient-states she saw Provider and Nutritionist-is working on reducing carbohydrates. Update 05/15/21:  Patient states her blood sugars have been between 120 and 140-she has not checked in a couple of days.  Has upcoming appointment with nutritionist for follow up. Update 06/15/21:  Patient has not checked blood sugars in a week  as she needs new strips-lost her kit  on trip to [place]-has new meter, needs to pick up strips.  Patient has no transportation issues now.  Follow up appointment with nutritionist 06/29/21. Update 07/11/21:  Patient doing well today-no complaints.  Bought new Lucianne Lei, getting prescriptions delivered by [facility] except for pain meds-gets those at [facility].  Checking blood sugars twice a day-100-142.  Continues to see nutritionist.  Nurse Case Manager Clinical Goal(s):  Over the next 30 days, patient will work with CM team pharmacist to review current medications. Update 01/22/21:  Patient met with Pharmacist and continues to follow. Over the next 30 days, patient will meet with Nutritionist and check her blood sugars. Over the next 30 days, patient will attend all scheduled appointments.  Interventions:  Inter-disciplinary care team collaboration (see longitudinal plan of care) Evaluation of current treatment plan and patient's adherence to plan as established by provider. Advised patient to contact her PCP for any medication needs. Reviewed medications with patient. Collaborated with pharmacy regarding medications.   Discussed plans with patient for ongoing care management follow up and provided patient with direct contact information for care management team Pharmacy referral for medication review. Patient given phone number for [facility] transportation if needed. Will notify PCP of need for testing strips and dietician referral. Update 02/21/21:  Patient has appointment with nutritionist 03/28/21 and has all testing supplies needed. Update 08/06/21:  No new appointments with nutritionist at this time.  Patient Goals/Self-Care Activities Over the next 30 days, patient will:  -Patient will take medications as prescribed. Calls pharmacy for medication refills Calls provider office for new concerns or questions  Follow Up Plan: The Managed Medicaid care management team will reach out to the patient again over the next 30 days.  The patient has been provided with contact information for the Managed Medicaid care management team and has been advised to call with any health related questions or concerns.     Follow Up:  Patient agrees to Care Plan and Follow-up.  Plan: The Managed Medicaid care management team will reach out to the patient again over the next 30 days. and The  Patient has been provided with contact information for the Managed Medicaid care management team and has been advised to call with any health related questions or concerns.  Date/time of next scheduled RN care management/care coordination outreach:  09/06/21 at 1230.

## 2021-08-06 NOTE — Patient Instructions (Signed)
Hi Alison [name], thank you for speaking with me today-have a great afternoon!  Alison [name] was given information about Medicaid Managed Care team care coordination services as a part of their Healthy [facility] Medicaid benefit. Alison [name] verbally consented to engagement with the [facility] Managed Care team.   If you are experiencing a medical emergency, please call 911 or report to your local emergency department or urgent care.   If you have a non-emergency medical problem during routine business hours, please contact your provider's office and ask to speak with a nurse.   For questions related to your Healthy [facility] health plan, please call: [phone] or visit the homepage here: [email or web address]  If you would like to schedule transportation through your Healthy [facility] plan, please call the following number at least 2 days in advance of your appointment: [phone]  Call the [place] at [phone], at any time, 24 hours a day, 7 days a week. If you are in danger or need immediate medical attention call 911.  If you would like help to quit smoking, call 1-800-QUIT-NOW 5315624439) OR Espaol: 1-855-Djelo-Ya (4-765-465-0354) o para ms informacin haga clic aqu or Text READY to 200-400 to register via text  Alison [name] - following are the goals we discussed in your visit today:   Goals Addressed             This Visit's Progress    Chronic Pain Managed        Update 01/22/21:  Patient sees Dr. Humphrey Rolls for chronic pain management. Update 02/21/21:  Patient is restarting Cymbalta. Update 03/20/21:  Chronic pain stable per patient-no complaints today-recently saw Dr. Humphrey Rolls Update 04/19/21:  No complaints today Update 05/15/21:  Cymbalta and Baclofen stopped by provider-patient experiencing more symptoms-will contact provider. Update 06/15/21:  Patient restarted on Baclofen, Effexor added. Update 07/11/21:  patient  without complaints today-feels good Update 08/06/21:  Patient states headache today-refilled pain medication today-has appt with Neurologist this week  Evidence-based guidance:  Address common beliefs about pain, such as pain is to be endured, a normal part of aging or that it is not "real"; feelings of resignation that nothing can be done and that complaining will be a sign of weakness.  Assess pain level, treatment efficacy and patient response at regular intervals using a consistent pain scale.  Assess pain using self-report (most reliable), family/caregiver report, validated pain scale; consider impact on quality of life.  Determine if pain is associated with mobility or at rest, location, intensity, frequency, duration, recurrence, pattern and description (e.g., cramping, burning, aching), triggers and relieving factors.   Anticipate referral to pain education program, pain management support or community resources for specific diagnoses (e.g., cancer, fibromyalgia, multiple sclerosis).  Explore fears associated with anticipated or imagined pain; encourage acceptance-based approaches.  Encourage exposure to experiences previously avoided due to fear of pain.  Anticipate referral to pain management specialist, physical therapist, addiction specialist (if history of substance use), psychotherapist; advocate for consultation with pharmacist.  Initiate nonpharmacologic measures, such as cognitive behavior therapy, mindfulness, guided imagery, massage, distraction, relaxation, chiropractic manipulation, dietary supplements or acupuncture.  Provide multimodal treatment interventions, such as physical activity, therapeutic exercise, yoga, TENS (transcutaneous electrical nerve stimulation) and manual therapy.  Train in functional activity modifications, such as body mechanics, posture, ergonomics, energy conservation and activity pacing.  Encourage use of local anesthetic or analgesic therapy as an  adjunct for pain control (e.g., lidocaine patch, capsaicin cream, topical nonsteroidal anti-inflammatory drugs).  Prepare patient for  use of pharmacologic therapy in a stepped approach that may include acetaminophen, nonsteroidal anti-inflammatory drugs, opioid, antiepileptic, antidepressant or nonbenzodiazepine muscle relaxant.  Review efficacy, tolerability, adherence and manage medication-induced side effects.      The patient verbalized understanding of instructions provided today and declined a print copy of patient instruction materials.   The Managed Medicaid care management team will reach out to the patient again over the next 30 days.  The  Patient  has been provided with contact information for the Managed Medicaid care management team and has been advised to call with any health related questions or concerns.   Alison Raider RN, [name] [place]  [facility] Curator - Managed Medicaid High Risk [phone].    Following is a copy of your plan of care:  Care Plan : Chronic Pain (Adult)  Updates made by [name], RN since 08/06/2021 12:00 AM     Problem: Chronic Pain Management-fibromyalgia   Priority: High  Onset Date: 01/22/2021     Long-Range Goal: Fibromyalgia pain managed-new pain management provider   Start Date: 08/14/2020  Expected End Date: 11/06/2021  Recent Progress: On track  Priority: High  Note:   Current Barriers:  Care Coordination needs related to pain management provider.   Patient is experiencing fibromyalgia flair and is interested in help being connected to a pain management provider. Unable to independently secure referral or appointment for pain management provider. Update 11/24/20:  Patient is being followed by Dr. Humphrey Rolls. Update 07/11/21:  Patient without complaint today-pain managed. Update 08/06/21:  Patient continues to see Dr. Humphrey Rolls and her neurologist-has appointment this week.  C/O headache-refilled pain  medicine.  Nurse Case Manager Clinical Goal(s):  Over the next 45 days, patient will work with [facility] to address needs related to referral for pain management provider and associated care coordination needs. Update 09/22/20:  patient is currently seeing Dr. Humphrey Rolls at [facility].  Interventions:  Inter-disciplinary care team collaboration (see longitudinal plan of care) Evaluation of current treatment plan related to fibromyalgia  and patient's adherence to plan as established by provider. Update 02/21/21:  Patient has restarted Cymbalta. Update 04/19/21:  No complaints today. Update 05/15/21:  Patient's Cymbalta and Baclofen stopped by provider, patient will contact provider for alternative as she is having increased symptoms. Update 06/15/21:  Patient taking Baclofen again, Effexor added. Collaborated with primary care provider regarding recommendations and referral to pain management provider. Discussed plans with patient for ongoing care management follow up and provided patient with direct contact information for care management team Anticipate pain education program, pain management support as part of pain management referral.      [name] contacted patient to check in. Patient stated she has not contacted an eye doctor nor has she seen a specialist. Patient states she thinks she has COVID due to her husband having it. She has not taken a test yet, but has some symptoms. She is isolating in the bedroom and they have someone to cook and bring her food.       Update 11/16/20: [name] completed phone call with patient, she states she is still having symptoms from [place] and has been in bed all month. Patient states she has not had a chance to contact an eye doctor or specialist due to being sick. Update 11/24/20:  Patient with continued fatigue-will follow up with PCP. Update 01/22/21:  patient continues to follow up with provider. Pharmacy referral for medication review. Update 01/22/21:  Patient met with Pharmacist-continuing to follow. Update 12/14/20: [name] completed call with patient, she stated she is waiting on a call from [facility] about getting a liver biopsy completed. She has not reached out to the eye doctor yet because her liver is top priority right now. Update 12/22/20:  Patient seen by PCP 12/11/20.  Has not seen Opthamologist yet.  Had liver biopsy 12/20/20 due to increased liver enzymes.  Update 01/15/21: [name] completed a follow up with patient. She stated she found out she has to have a liver transplant and is currently completing testing. Patient stated that she has been in pain since her pcp took her off of her pain medication. Patient stated her rent will be going up by $400 and they have to move. They will most likely move to [place]/ area because her fiance' works in that area. No resources needed at this time. Update 03/20/21:  Pain states no pain at this time.  Has decided to stay in current apartment.  Patient states her liver function tests have improved and she is stable right now. Update 05/15/21:  Encouraged patient to schedule appointment with Ophthalmologist-patient states she will do this. Update 06/15/21:  Patient to schedule an appointment with ophthalmologist-has not done so yet.   Update 07/11/21:  Patient stable today-states she will schedule eye appointment today. Update 08/06/21:  Patient seen and evaluated by eye doctor-needs to get glasses.  Patient Goals/Self-Care Activities Over the next 45 days, patient will:  -Attends all scheduled provider appointments  develop a personal pain management plan with your pain management provider.  Follow Up Plan:  The Managed Medicaid care management team will reach out to the patient again over the next 30 days     Care Plan : Wellness (Adult)  Updates made by [name], RN since 08/06/2021 12:00 AM     Problem: Medication Adherence (Wellness)   Priority: High  Onset Date: 01/22/2021     Long-Range Goal:  Medication Adherence Maintained   Start Date: 09/06/2020  Expected End Date: 11/06/2021  Recent Progress: Not on track  Priority: High  Note:   Current Barriers:  Patient states she needs to have prescriptions refilled and feels she has some side effects from her medications. Patient with only 1 vehicle now and having trouble attending appointments-has cancelled 2 appointments recently. Patient with new neck lump-has CT scheduled 04/24/21, ? Lymphoma. Update 12/22/20:  Patient's PCP discontinued Victoza and added Trulicity.  Patient has not been taking mediations as prescribed.  Hgb A1C 8.1 on 12/11/20. Update 01/22/21:  Patient states she is taking medications.  She checks blood sugar once a day and needs more testing strips.  Blood sugars 200's per patient. Update 02/21/21:  Patient is checking blood sugars 4 times a day.  Has appointment with endocrinologist today and is keeping food diary. Update 03/20/21:  Blood sugars mid 100's perpatient-has appointment with nutritionist 03/28/21. Update 04/20/21:  Blood sugars 220-300 per patient-states she saw Provider and Nutritionist-is working on reducing carbohydrates. Update 05/15/21:  Patient states her blood sugars have been between 120 and 140-she has not checked in a couple of days.  Has upcoming appointment with nutritionist for follow up. Update 06/15/21:  Patient has not checked blood sugars in a week  as she needs new strips-lost her kit on trip to [place]-has new meter, needs to pick up strips.  Patient has no transportation issues now.  Follow up appointment with nutritionist 06/29/21. Update 07/11/21:  Patient doing well today-no complaints.  Bought  new Lucianne Lei, getting prescriptions delivered by [facility] except for pain meds-gets those at [facility].  Checking blood sugars twice a day-100-142.  Continues to see nutritionist.  Nurse Case Manager Clinical Goal(s):  Over the next 30 days, patient will work with CM team pharmacist to review current  medications. Update 01/22/21:  Patient met with Pharmacist and continues to follow. Over the next 30 days, patient will meet with Nutritionist and check her blood sugars. Over the next 30 days, patient will attend all scheduled appointments.  Interventions:  Inter-disciplinary care team collaboration (see longitudinal plan of care) Evaluation of current treatment plan and patient's adherence to plan as established by provider. Advised patient to contact her PCP for any medication needs. Reviewed medications with patient. Collaborated with pharmacy regarding medications.  Discussed plans with patient for ongoing care management follow up and provided patient with direct contact information for care management team Pharmacy referral for medication review. Patient given phone number for Healthy [facility] transportation if needed. Will notify PCP of need for testing strips and dietician referral. Update 02/21/21:  Patient has appointment with nutritionist 03/28/21 and has all testing supplies needed. Update 08/06/21:  No new appointments with nutritionist at this time.  Patient Goals/Self-Care Activities Over the next 30 days, patient will:  -Patient will take medications as prescribed. Calls pharmacy for medication refills Calls provider office for new concerns or questions  Follow Up Plan: The Managed Medicaid care management team will reach out to the patient again over the next 30 days.  The patient has been provided with contact information for the Managed Medicaid care management team and has been advised to call with any health related questions or concerns.

## 2021-08-07 ENCOUNTER — Other Ambulatory Visit: Payer: Self-pay

## 2021-08-07 ENCOUNTER — Other Ambulatory Visit: Payer: Medicaid Other | Admitting: Obstetrics and Gynecology

## 2021-08-07 NOTE — Patient Outreach (Signed)
Care Coordination  08/07/2021  Alison [name] 06-22-1976 [id number]  RNCM returned patient's phone call regarding medication.  Patient states she has been in contact with provider's office for medication.  [name], [name] [place]  [facility] Curator - Managed Medicaid High Risk [phone].

## 2021-08-10 DIAGNOSIS — M25552 Pain in left hip: Secondary | ICD-10-CM | POA: Diagnosis not present

## 2021-08-10 DIAGNOSIS — M545 Low back pain, unspecified: Secondary | ICD-10-CM | POA: Diagnosis not present

## 2021-08-13 ENCOUNTER — Other Ambulatory Visit: Payer: Self-pay

## 2021-08-13 NOTE — Patient Outreach (Signed)
Medicaid Managed Care Social Work Note  08/13/2021 Name:  Alison [name] MRN:  [id number] DOB:  Aug 17, 1976  Alison [name] is an 45 y.o. year old female who is a primary patient of [facility].  The [facility] Managed Care Coordination team was consulted for assistance with:   eye glasses  Ms. Brittingham was given information about Medicaid Managed Care Coordination team services today. [name] Patient agreed to services and verbal consent obtained.  Engaged with patient  for by telephone forfollow up visit in response to referral for case management and/or care coordination services.   Assessments/Interventions:  Review of past medical history, allergies, medications, health status, including review of consultants reports, laboratory and other test data, was performed as part of comprehensive evaluation and provision of chronic care management services.  SDOH: (Social Determinant of Health) assessments and interventions performed: [name] contacted patient and provided resource to get eye glassess in [place]. [name] provided the phone number for [facility]. No other resources needed at this time.   Advanced Directives Status:  Not addressed in this encounter.  Care Plan                 Allergies  Allergen Reactions   Penicillin G Itching, Rash, Anaphylaxis and Palpitations   Lamictal [Lamotrigine] Rash   Carbamazepine Other (See Comments)    Medication interaction-prograf   Hydrocodone-Acetaminophen Itching   Naproxen Itching    Medications Reviewed Today     Reviewed by [name], RN (Registered Nurse) on 08/07/21 at 1411  Med List Status: <None>   Medication Order Taking? Sig Documenting Provider Last Dose Status Informant  Accu-Chek FastClix Lancets MISC [id number] No Apply topically 2 (two) times daily. [provider] Taking Active   baclofen (LIORESAL) 10 MG tablet [id number] No Take 10 mg by mouth 2 (two) times daily.  [provider] Taking Active   benztropine (COGENTIN) 1 MG tablet [id number] No Take 1 tablet (1 mg total) by mouth daily as needed for tremors. [name], MD Taking Active Self  Dulaglutide (TRULICITY) 4.33 IR/5.1OA SOPN [id number] No Inject 0.75 mg into the skin once a week.  Patient taking differently: Inject 0.75 mg into the skin every Wednesday.   Myles Gip, DO Taking Active   ergocalciferol (VITAMIN D2) 1.25 MG (50000 UT) capsule [id number] No Take 1 capsule by mouth once a week. [provider] Taking Active   ezetimibe (ZETIA) 10 MG tablet [id number] No Take 10 mg by mouth daily. [provider] Taking Active   glucose blood (COOL BLOOD GLUCOSE TEST STRIPS) test strip [id number] No Use as instructed Steele Sizer, MD Taking Active   hydrOXYzine (VISTARIL) 25 MG capsule [id number] No Take by mouth. [provider] Taking Active   icosapent Ethyl (VASCEPA) 1 g capsule [id number] No Take by mouth. [provider] Taking Active   lisinopril (ZESTRIL) 20 MG tablet [id number] No Take 20 mg by mouth daily. [provider] Taking Active   Magnesium 500 MG CAPS [id number] No Take by mouth. [provider] Taking Active   Melatonin 10 MG TABS [id number] No Take 10 mg by mouth at bedtime as needed (sleep). [provider] Taking Active Self  metFORMIN (GLUCOPHAGE) 1000 MG tablet [id number] No Take 1 tablet (1,000 mg total) by mouth 2 (two) times daily with a meal. Myles Gip, DO Taking Active Self  mirabegron ER (MYRBETRIQ) 50 MG TB24 tablet [id number] No Take 1 tablet (50 mg total) by mouth daily. Vaillancourt,  Aldona Bar, PA-C Taking Active Self  Multiple Vitamin (MULTI-VITAMIN) tablet [id number] No Take 1 tablet by mouth daily. [provider] Taking Active Self  ondansetron (ZOFRAN ODT) 4 MG disintegrating tablet [id number] No Take 1 tablet (4 mg total) by mouth every 8 (eight) hours as needed for nausea or  vomiting. Paulette Blanch, MD Taking Active   oxyCODONE ER [facility] ER) 13.5 MG C12A [id number] No Take 1 tablet by mouth 2 (two) times daily. [provider] Taking Active   pregabalin (LYRICA) 200 MG capsule [id number] No Take 200 mg by mouth 2 (two) times daily. [provider] Taking Active   promethazine (PHENERGAN) 25 MG tablet [id number] No Take 1 tablet (25 mg total) by mouth as needed. Myles Gip, DO Taking Active Self  QUEtiapine (SEROQUEL XR) 300 MG 24 hr tablet [id number] No Take 1 tablet (300 mg total) by mouth at bedtime. [name], MD Taking Active   QUEtiapine (SEROQUEL XR) 50 MG TB24 24 hr tablet [id number] No Take 1 tablet (50 mg total) by mouth at bedtime. Take along with 300 mg at bedtime [name], MD Taking Active   sulfamethoxazole-trimethoprim (BACTRIM DS) 800-160 MG per tablet 1 tablet [id number]   Hollice Espy, MD  Active   tacrolimus (PROGRAF) 1 MG capsule [id number] No Take 1 capsule by mouth once daily  Patient taking differently: Take 1 mg by mouth 1 day or 1 dose.   [name], FNP Taking Active Self           Med Note [name], [name]   Fri Jan 19, 2021  5:01 AM)    venlafaxine XR (EFFEXOR-XR) 37.5 MG 24 hr capsule [id number] No TAKE ONE CAPSULE BY MOUTH DAILY WITH Eldridge Abrahams, MD Taking Active   XYLIMELTS 550 MG DISK [id number] No USE one BY MOUTH EVERY 8 HOURS AS NEEDED FOR DRY MOUTH [provider] Taking Active             Patient Active Problem List   Diagnosis Date Noted   Insomnia 07/20/2021   Bipolar disorder, in full remission, most recent episode mixed ([place]) 07/20/2021   Bipolar 1 disorder, mixed, mild ([place]) 05/09/2021   GAD (generalized anxiety disorder) 05/09/2021   Hepatic cirrhosis ([place])    Opioid dependence with opioid-induced disorder ([place]) 12/20/2020   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   Bipolar disorder, in full remission, most recent episode  depressed ([place]) 09/27/2020   Suprapubic pain 07/25/2020   Bipolar 1 disorder, depressed, mild ([place]) 07/10/2020   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   Edema of lower extremity 09/16/2019   Carpal tunnel syndrome, left 07/26/2019   Cubital tunnel syndrome on left 07/26/2019   Bipolar I disorder, most recent episode depressed ([place]) 07/12/2019   PTSD (post-traumatic stress disorder) 66/03/44   Umbilical hernia without obstruction and without gangrene 06/17/2019   Encounter for surveillance of abnormal nevi 06/17/2019   Pineal gland cyst 06/17/2019   Chronic hip pain, left 06/02/2019   Lumbar spondylosis 06/02/2019   Mouth dryness 06/02/2019   Obesity (BMI 35.0-39.9 without comorbidity) 06/02/2019   Goals of care, counseling/discussion 05/18/2019   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   Occipital neuralgia 05/13/2019   Plantar fasciitis of left foot 04/27/2019   Fibromyalgia 03/23/2019   Systemic lupus erythematosus (SLE) in adult [facility]) 03/23/2019   Essential hypertension 03/23/2019   Hepatitis 03/23/2019   Mass of left kidney 03/23/2019  Diabetes mellitus ([place]) 03/23/2019   Nausea without vomiting 03/23/2019   Neuropathy 03/23/2019   Cancer ([place]) 03/23/2019   Hyperlipidemia 03/23/2019   Osteoarthritis 03/23/2019   Trigeminal neuralgia 03/23/2019   Chronic renal disease, stage III ([place]) 03/23/2019   Nephrolithiasis 03/23/2019   Migraine 03/23/2019   OSA on CPAP 03/23/2019   Fibrocystic breast 03/23/2019   Heart murmur 03/23/2019   Bipolar disorder, in partial remission, most recent episode depressed ([place]) 07/02/2017   Abnormal uterine bleeding 03/18/2017   Major depressive disorder, recurrent ([place]) 03/18/2017   Morbid obesity ([place]) 03/18/2017   Secondary hyperparathyroidism ([place]) 02/07/2017   Neuropathic pain 11/19/2016   Post herpetic neuralgia  11/19/2016   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   Follow up:  Patient agrees to Care Plan and Follow-up.  Plan: The  Patient has been provided with contact information for the Managed Medicaid care management team and has been advised to call with any health related questions or concerns.    Mickel Fuchs, [name], [place] Managed Medicaid Team  [phone]

## 2021-08-13 NOTE — Patient Instructions (Signed)
Visit Information  Alison [name] was given information about Medicaid Managed Care team care coordination services as a part of their Healthy [facility] Medicaid benefit. Alison [name] did not consent to engagement with the [facility] Managed Care team.   If you are experiencing a medical emergency, please call 911 or report to your local emergency department or urgent care.   If you have a non-emergency medical problem during routine business hours, please contact your provider's office and ask to speak with a nurse.   For questions related to your Healthy [facility] health plan, please call: [phone] or visit the homepage here: [email or web address]  If you would like to schedule transportation through your Healthy [facility] plan, please call the following number at least 2 days in advance of your appointment: [phone]  Call the [place] at [phone], at any time, 24 hours a day, 7 days a week. If you are in danger or need immediate medical attention call 911.  If you would like help to quit smoking, call 1-800-QUIT-NOW (863) 420-2876) OR Espaol: 1-855-Djelo-Ya (5-537-482-7078) o para ms informacin haga clic aqu or Text READY to 200-400 to register via text  Alison [name] - following are the goals we discussed in your visit today:   Goals Addressed   None       The  Patient                                              has been provided with contact information for the Managed Medicaid care management team and has been advised to call with any health related questions or concerns.   Alison [name], [name], [place] Managed Medicaid Team  [phone]   Following is a copy of your plan of care:  There are no care plans that you recently modified to display for this patient.

## 2021-08-14 DIAGNOSIS — K7469 Other cirrhosis of liver: Secondary | ICD-10-CM | POA: Diagnosis not present

## 2021-08-14 DIAGNOSIS — Z944 Liver transplant status: Secondary | ICD-10-CM | POA: Diagnosis not present

## 2021-08-21 DIAGNOSIS — Z129 Encounter for screening for malignant neoplasm, site unspecified: Secondary | ICD-10-CM | POA: Diagnosis not present

## 2021-08-21 DIAGNOSIS — I10 Essential (primary) hypertension: Secondary | ICD-10-CM | POA: Diagnosis not present

## 2021-08-21 DIAGNOSIS — Z944 Liver transplant status: Secondary | ICD-10-CM | POA: Diagnosis not present

## 2021-08-21 DIAGNOSIS — E1165 Type 2 diabetes mellitus with hyperglycemia: Secondary | ICD-10-CM | POA: Diagnosis not present

## 2021-08-21 DIAGNOSIS — E7849 Other hyperlipidemia: Secondary | ICD-10-CM | POA: Diagnosis not present

## 2021-08-21 DIAGNOSIS — K7460 Unspecified cirrhosis of liver: Secondary | ICD-10-CM | POA: Diagnosis not present

## 2021-08-24 ENCOUNTER — Other Ambulatory Visit: Payer: Self-pay

## 2021-08-24 ENCOUNTER — Other Ambulatory Visit
Admission: RE | Admit: 2021-08-24 | Discharge: 2021-08-24 | Disposition: A | Discharge status: Home / Self Care | Payer: Medicaid Other | Source: Ambulatory Visit | Attending: Neurology | Admitting: Neurology

## 2021-08-24 ENCOUNTER — Other Ambulatory Visit: Payer: Self-pay | Admitting: Family Medicine

## 2021-08-24 DIAGNOSIS — Z20822 Contact with and (suspected) exposure to covid-19: Secondary | ICD-10-CM | POA: Diagnosis not present

## 2021-08-24 NOTE — Telephone Encounter (Signed)
Requested Prescriptions  Refused Prescriptions Disp Refills  . promethazine (PHENERGAN) 25 MG tablet [Pharmacy Med Name: promethazine 25 mg tablet] 15 tablet 0    Sig: TAKE ONE TABLET BY MOUTH AS NEEDED     Not Delegated - Gastroenterology: Antiemetics Failed - 08/24/2021  4:21 PM      Failed - This refill cannot be delegated      Failed - Valid encounter within last 6 months    Recent Outpatient Visits          8 months ago Uncontrolled type 2 diabetes mellitus with hyperglycemia [facility])   [facility] Rory Percy M, DO   8 months ago Uncontrolled type 2 diabetes mellitus with hyperglycemia [facility])   [place], DO   10 months ago Viral syndrome   [facility] Lebron Conners D, MD   11 months ago Bipolar I disorder, most recent episode depressed [facility])   [facility] Towanda Malkin, MD   11 months ago Urgency of urination   [facility] Towanda Malkin, MD

## 2021-08-24 NOTE — Telephone Encounter (Signed)
Don;t think she s a patient of [facility].

## 2021-08-24 NOTE — Telephone Encounter (Signed)
Requested medication (s) are due for refill today: Yes  Requested medication (s) are on the active medication list: Yes  Last refill:  12/04/20 #15/0RF  Future visit scheduled: No  Notes to clinic:  Unable to refill per protocol, cannot delegate.      Requested Prescriptions  Pending Prescriptions Disp Refills   promethazine (PHENERGAN) 25 MG tablet [Pharmacy Med Name: promethazine 25 mg tablet] 15 tablet 0    Sig: TAKE ONE TABLET BY MOUTH AS NEEDED     Not Delegated - Gastroenterology: Antiemetics Failed - 08/24/2021 10:24 AM      Failed - This refill cannot be delegated      Failed - Valid encounter within last 6 months    Recent Outpatient Visits           8 months ago Uncontrolled type 2 diabetes mellitus with hyperglycemia [facility])   [facility] Rory Percy M, DO   8 months ago Uncontrolled type 2 diabetes mellitus with hyperglycemia [facility])   [place], DO   10 months ago Viral syndrome   [facility] Lebron Conners D, MD   11 months ago Bipolar I disorder, most recent episode depressed [facility])   [facility] Towanda Malkin, MD   11 months ago Urgency of urination   [facility] Towanda Malkin, MD

## 2021-08-25 LAB — SARS CORONAVIRUS 2 (TAT 6-24 HRS): SARS Coronavirus 2: NEGATIVE

## 2021-08-27 ENCOUNTER — Other Ambulatory Visit: Payer: Self-pay

## 2021-08-27 ENCOUNTER — Ambulatory Visit: Payer: Medicaid Other | Attending: Neurology

## 2021-08-27 DIAGNOSIS — G4733 Obstructive sleep apnea (adult) (pediatric): Secondary | ICD-10-CM | POA: Diagnosis not present

## 2021-08-27 NOTE — Telephone Encounter (Signed)
Appt made

## 2021-08-28 ENCOUNTER — Ambulatory Visit: Payer: Medicaid Other | Admitting: Family Medicine

## 2021-08-28 ENCOUNTER — Other Ambulatory Visit: Payer: Self-pay

## 2021-08-28 DIAGNOSIS — Z114 Encounter for screening for human immunodeficiency virus [HIV]: Secondary | ICD-10-CM

## 2021-08-28 DIAGNOSIS — E1165 Type 2 diabetes mellitus with hyperglycemia: Secondary | ICD-10-CM | POA: Diagnosis not present

## 2021-08-28 DIAGNOSIS — E669 Obesity, unspecified: Secondary | ICD-10-CM | POA: Diagnosis not present

## 2021-08-28 DIAGNOSIS — Z944 Liver transplant status: Secondary | ICD-10-CM | POA: Diagnosis not present

## 2021-08-28 DIAGNOSIS — K7581 Nonalcoholic steatohepatitis (NASH): Secondary | ICD-10-CM | POA: Diagnosis not present

## 2021-08-28 DIAGNOSIS — Z6839 Body mass index (BMI) 39.0-39.9, adult: Secondary | ICD-10-CM | POA: Diagnosis not present

## 2021-08-28 DIAGNOSIS — E7849 Other hyperlipidemia: Secondary | ICD-10-CM | POA: Diagnosis not present

## 2021-08-28 DIAGNOSIS — M797 Fibromyalgia: Secondary | ICD-10-CM | POA: Diagnosis not present

## 2021-08-28 DIAGNOSIS — E785 Hyperlipidemia, unspecified: Secondary | ICD-10-CM

## 2021-08-28 DIAGNOSIS — I10 Essential (primary) hypertension: Secondary | ICD-10-CM | POA: Diagnosis not present

## 2021-08-28 DIAGNOSIS — F3131 Bipolar disorder, current episode depressed, mild: Secondary | ICD-10-CM | POA: Diagnosis not present

## 2021-08-28 DIAGNOSIS — C8580 Other specified types of non-Hodgkin lymphoma, unspecified site: Secondary | ICD-10-CM | POA: Diagnosis not present

## 2021-08-29 ENCOUNTER — Encounter: Payer: Self-pay | Admitting: Family Medicine

## 2021-08-29 ENCOUNTER — Ambulatory Visit: Payer: Medicaid Other | Admitting: Family Medicine

## 2021-08-29 VITALS — BP 124/76 | HR 88 | Temp 97.6°F | Resp 16 | Ht 66.0 in | Wt 252.6 lb

## 2021-08-29 DIAGNOSIS — D47Z1 Post-transplant lymphoproliferative disorder (PTLD): Secondary | ICD-10-CM

## 2021-08-29 DIAGNOSIS — M329 Systemic lupus erythematosus, unspecified: Secondary | ICD-10-CM

## 2021-08-29 DIAGNOSIS — Z87440 Personal history of urinary (tract) infections: Secondary | ICD-10-CM | POA: Diagnosis not present

## 2021-08-29 DIAGNOSIS — F1129 Opioid dependence with unspecified opioid-induced disorder: Secondary | ICD-10-CM

## 2021-08-29 DIAGNOSIS — N183 Chronic kidney disease, stage 3 unspecified: Secondary | ICD-10-CM

## 2021-08-29 DIAGNOSIS — C8580 Other specified types of non-Hodgkin lymphoma, unspecified site: Secondary | ICD-10-CM

## 2021-08-29 DIAGNOSIS — R300 Dysuria: Secondary | ICD-10-CM

## 2021-08-29 DIAGNOSIS — F3178 Bipolar disorder, in full remission, most recent episode mixed: Secondary | ICD-10-CM

## 2021-08-29 DIAGNOSIS — Z23 Encounter for immunization: Secondary | ICD-10-CM | POA: Diagnosis not present

## 2021-08-29 DIAGNOSIS — N1831 Chronic kidney disease, stage 3a: Secondary | ICD-10-CM

## 2021-08-29 DIAGNOSIS — Z79899 Other long term (current) drug therapy: Secondary | ICD-10-CM

## 2021-08-29 DIAGNOSIS — E785 Hyperlipidemia, unspecified: Secondary | ICD-10-CM

## 2021-08-29 DIAGNOSIS — M797 Fibromyalgia: Secondary | ICD-10-CM

## 2021-08-29 DIAGNOSIS — I10 Essential (primary) hypertension: Secondary | ICD-10-CM

## 2021-08-29 DIAGNOSIS — Z944 Liver transplant status: Secondary | ICD-10-CM

## 2021-08-29 DIAGNOSIS — Z760 Encounter for issue of repeat prescription: Secondary | ICD-10-CM

## 2021-08-29 DIAGNOSIS — E1165 Type 2 diabetes mellitus with hyperglycemia: Secondary | ICD-10-CM

## 2021-08-29 DIAGNOSIS — R9431 Abnormal electrocardiogram [ECG] [EKG]: Secondary | ICD-10-CM

## 2021-08-29 LAB — POCT URINALYSIS DIPSTICK
Bilirubin, UA: NEGATIVE
Glucose, UA: NEGATIVE
Ketones, UA: NEGATIVE
Leukocytes, UA: NEGATIVE
Nitrite, UA: NEGATIVE
Protein, UA: NEGATIVE
Spec Grav, UA: 1.02 (ref 1.010–1.025)
Urobilinogen, UA: 0.2 E.U./dL
pH, UA: 6.5 (ref 5.0–8.0)

## 2021-08-29 MED ORDER — LISINOPRIL 20 MG PO TABS: 20.0000 mg | ORAL_TABLET | Freq: Every day | ORAL | 3 refills | Status: DC

## 2021-08-29 MED ORDER — NITROFURANTOIN MONOHYD MACRO 100 MG PO CAPS: 100.0000 mg | ORAL_CAPSULE | Freq: Two times a day (BID) | ORAL | 0 refills | Status: DC

## 2021-08-29 NOTE — Progress Notes (Signed)
Name: Alison [name]   MRN: [id number]    DOB: 07/06/1976   Date:08/29/2021       Progress Note  Chief Complaint  Patient presents with   Hypertension   Hyperlipidemia   Diabetes   Anxiety     Subjective:   Alison [name] is a 45 y.o. female, presents to clinic for routine f/up Prior pt of Raelyn Ensign NP, Dr. Roxan Hockey and seen over the last year by prn providers Pt new to me  Here for f/up on DM HTN HLD and anxiety   DM:  uncontrolled - Moriarty managing but I cannot see any records - 7.4 last week  Pt managing DM with metformin and trulicity   Recent pertinent labs: Lab Results  Component Value Date   HGBA1C 8.9 (A) 12/11/2020   HGBA1C 8.9 (H) 06/05/2020   HGBA1C 6.6 (H) 07/15/2019   Lab Results  Component Value Date   MICROALBUR 18.7 06/05/2020   [place] 133 (H) 12/11/2020   CREATININE 0.93 07/31/2021   Standard of care and health maintenance: Urine Microalbumin:  off ACEI just ran out  Foot exam:  utd DM eye exam:  utd ACEI/ARB:  not currently taking but previously prescribed lisinopril 20 mg Statin:  not taking, see none in med list hx and no allergy noted on chart  Riki Sheer main specialist at [facility]/hepatologist    Hypertension:  Currently managed on lisinopril  Pt reports good med compliance and denies any SE.   Blood pressure today is well controlled. BP Readings from Last 3 Encounters:  08/29/21 124/76  07/31/21 105/70  04/30/21 122/81   Pt denies CP, SOB, exertional sx, LE edema, palpitation, Ha's, visual disturbances, lightheadedness, hypotension, syncope.   Hyperlipidemia: Unsure if she is on a statin or not?  May not be able to take though she thinks she has crestor at home Last Lipids: Lab Results  Component Value Date   CHOL 219 (H) 12/11/2020   HDL 38 (L) 12/11/2020   LDLCALC 133 (H) 12/11/2020   TRIG 333 (H) 12/11/2020   CHOLHDL 5.8 (H) 12/11/2020   - Denies: Chest pain, shortness of breath, myalgias,  claudication  Multiple psych meds - managed by specialist  Depression screen [facility] 2/9 08/29/2021 03/28/2021 12/11/2020  Decreased Interest 1 1 1   Down, Depressed, Hopeless 2 2 2   PHQ - 2 Score 3 3 3   Altered sleeping 3 2 1   Tired, decreased energy 2 2 1   Change in appetite 3 3 0  Feeling bad or failure about yourself  3 3 0  Trouble concentrating 3 3 0  Moving slowly or fidgety/restless 2 1 0  Suicidal thoughts 0 1 0  PHQ-9 Score 19 18 5   Difficult doing work/chores Somewhat difficult - Somewhat difficult  Some encounter information is confidential and restricted. Go to Review Flowsheets activity to see all data.  Some recent data might be hidden   Hx of liver transplant, liver cirrhosis secondary to NASH - tx 30 years ago On prograft Dr. Shea Evans managing bipolar and meds Also est with hem/onc - Dr. Janese Banks   Neurology ([place]/Shah) managed OSA  trigeminal neuralgia, migraines  Pt requests refill of phenergan today - using for nausea about 3x a week, no vomiting, she doesn't know why she is having N, denies indigestion, reflux, abd pain.    Polypharmacy -patient on Cogentin hydroxyzine Myrbetriq Zofran as needed pregabalin, promethazine quetiapine and venlafaxine  Reviewed EKG which was done in January 2022 QTC was over 500   Current  Outpatient Medications:    Accu-Chek FastClix Lancets MISC, Apply topically 2 (two) times daily., Disp: , Rfl:    baclofen (LIORESAL) 10 MG tablet, Take 10 mg by mouth 2 (two) times daily., Disp: , Rfl:    benztropine (COGENTIN) 1 MG tablet, Take 1 tablet (1 mg total) by mouth daily as needed for tremors., Disp: 30 tablet, Rfl: 1   Dulaglutide (TRULICITY) 5.27 PO/2.4MP SOPN, Inject 0.75 mg into the skin once a week. (Patient taking differently: Inject 0.75 mg into the skin every Wednesday.), Disp: 3 mL, Rfl: 3   ergocalciferol (VITAMIN D2) 1.25 MG (50000 UT) capsule, Take 1 capsule by mouth once a week., Disp: , Rfl:    ezetimibe (ZETIA) 10 MG tablet,  Take 10 mg by mouth daily., Disp: , Rfl:    glucose blood (COOL BLOOD GLUCOSE TEST STRIPS) test strip, Use as instructed, Disp: 100 each, Rfl: 12   hydrOXYzine (VISTARIL) 25 MG capsule, Take by mouth., Disp: , Rfl:    icosapent Ethyl (VASCEPA) 1 g capsule, Take by mouth., Disp: , Rfl:    Magnesium 500 MG CAPS, Take by mouth., Disp: , Rfl:    Melatonin 10 MG TABS, Take 10 mg by mouth at bedtime as needed (sleep)., Disp: , Rfl:    metFORMIN (GLUCOPHAGE) 1000 MG tablet, Take 1 tablet (1,000 mg total) by mouth 2 (two) times daily with a meal., Disp: 180 tablet, Rfl: 3   mirabegron ER (MYRBETRIQ) 50 MG TB24 tablet, Take 1 tablet (50 mg total) by mouth daily., Disp: 90 tablet, Rfl: 3   Multiple Vitamin (MULTI-VITAMIN) tablet, Take 1 tablet by mouth daily., Disp: , Rfl:    ondansetron (ZOFRAN ODT) 4 MG disintegrating tablet, Take 1 tablet (4 mg total) by mouth every 8 (eight) hours as needed for nausea or vomiting., Disp: 20 tablet, Rfl: 0   oxyCODONE ER (XTAMPZA ER) 13.5 MG C12A, Take 1 tablet by mouth 2 (two) times daily., Disp: , Rfl:    pregabalin (LYRICA) 200 MG capsule, Take 200 mg by mouth 2 (two) times daily., Disp: , Rfl:    promethazine (PHENERGAN) 25 MG tablet, Take 1 tablet (25 mg total) by mouth as needed., Disp: 15 tablet, Rfl: 0   QUEtiapine (SEROQUEL XR) 300 MG 24 hr tablet, Take 1 tablet (300 mg total) by mouth at bedtime., Disp: 90 tablet, Rfl: 0   QUEtiapine (SEROQUEL XR) 50 MG TB24 24 hr tablet, Take 1 tablet (50 mg total) by mouth at bedtime. Take along with 300 mg at bedtime, Disp: 90 tablet, Rfl: 0   tacrolimus (PROGRAF) 1 MG capsule, Take 1 capsule by mouth once daily (Patient taking differently: Take 1 mg by mouth 1 day or 1 dose.), Disp: 30 capsule, Rfl: 0   venlafaxine XR (EFFEXOR-XR) 37.5 MG 24 hr capsule, TAKE ONE CAPSULE BY MOUTH DAILY WITH BREAKFAST, Disp: 90 capsule, Rfl: 0   XYLIMELTS 550 MG DISK, USE one BY MOUTH EVERY 8 HOURS AS NEEDED FOR DRY MOUTH, Disp: , Rfl:     lisinopril (ZESTRIL) 20 MG tablet, Take 20 mg by mouth daily. (Patient not taking: Reported on 08/29/2021), Disp: , Rfl:   Current Facility-Administered Medications:    sulfamethoxazole-trimethoprim (BACTRIM DS) 800-160 MG per tablet 1 tablet, 1 tablet, Oral, Q12H, Hollice Espy, MD, 1 tablet at 01/17/21 1432  Patient Active Problem List   Diagnosis Date Noted   Insomnia 07/20/2021   Bipolar disorder, in full remission, most recent episode mixed ([place]) 07/20/2021   Bipolar 1 disorder, mixed, mild (  [place]) 05/09/2021   GAD (generalized anxiety disorder) 05/09/2021   Hepatic cirrhosis ([place])    Opioid dependence with opioid-induced disorder ([place]) 12/20/2020   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/20/2020   Bipolar disorder, in full remission, most recent episode depressed ([place]) 09/27/2020   Suprapubic pain 07/25/2020   Bipolar 1 disorder, depressed, mild ([place]) 07/10/2020   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   Edema of lower extremity 09/16/2019   Carpal tunnel syndrome, left 07/26/2019   Cubital tunnel syndrome on left 07/26/2019   Bipolar I disorder, most recent episode depressed ([place]) 07/12/2019   PTSD (post-traumatic stress disorder) 00/76/2263   Umbilical hernia without obstruction and without gangrene 06/17/2019   Encounter for surveillance of abnormal nevi 06/17/2019   Pineal gland cyst 06/17/2019   Chronic hip pain, left 06/02/2019   Lumbar spondylosis 06/02/2019   Mouth dryness 06/02/2019   Obesity (BMI 35.0-39.9 without comorbidity) 06/02/2019   Goals of care, counseling/discussion 05/18/2019   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[place]) 05/18/2019   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[place]) 05/18/2019   Occipital neuralgia 05/13/2019   Plantar fasciitis of left foot 04/27/2019   Fibromyalgia 03/23/2019   Systemic lupus erythematosus (SLE) in adult [facility]) 03/23/2019   Essential hypertension  03/23/2019   Hepatitis 03/23/2019   Mass of left kidney 03/23/2019   Diabetes mellitus ([place]) 03/23/2019   Nausea without vomiting 03/23/2019   Neuropathy 03/23/2019   Cancer ([place]) 03/23/2019   Hyperlipidemia 03/23/2019   Osteoarthritis 03/23/2019   Trigeminal neuralgia 03/23/2019   Chronic renal disease, stage III ([place]) 03/23/2019   Nephrolithiasis 03/23/2019   Migraine 03/23/2019   OSA on CPAP 03/23/2019   Fibrocystic breast 03/23/2019   Heart murmur 03/23/2019   Bipolar disorder, in partial remission, most recent episode depressed ([place]) 07/02/2017   Abnormal uterine bleeding 03/18/2017   Major depressive disorder, recurrent ([place]) 03/18/2017   Morbid obesity ([place]) 03/18/2017   Secondary hyperparathyroidism ([place]) 02/07/2017   Neuropathic pain 11/19/2016   Post herpetic neuralgia 11/19/2016   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[place]) 01/13/2015   Lumbar disc disease with radiculopathy 07/26/2013   S/P liver transplant ([place]) 07/26/2013   Type 2 diabetes mellitus, uncontrolled 07/26/2013   Facial nerve disorder 06/29/2012   Low back pain 12/26/2010    Past Surgical History:  Procedure Laterality Date   BONE MARROW BIOPSY  01/13/2015   BREAST BIOPSY  12/2014   BREAST BIOPSY  2011   BREAST SURGERY     CHOLECYSTECTOMY     COLONOSCOPY     ESOPHAGOGASTRODUODENOSCOPY     ESOPHAGOGASTRODUODENOSCOPY (EGD) WITH PROPOFOL N/A 01/09/2021   Procedure: ESOPHAGOGASTRODUODENOSCOPY (EGD) WITH PROPOFOL;  Surgeon: [name], MD;  Location: [facility] ENDOSCOPY;  Service: Endoscopy;  Laterality: N/A;   HERNIA REPAIR     infusaport     LIVER TRANSPLANT  12/17/1991   LUMBAR PUNCTURE     PORT A CATH INJECTION ([facility])     tumor removal  2015    Family History  Problem Relation Age of Onset   Heart disease Mother    Hypertension Mother    Cancer - Other Mother    Bipolar disorder Mother    Heart disease Father    Hypertension Father    Diabetes Father    Parkinson's disease Maternal  Grandmother    Cancer Maternal Aunt    Cancer Maternal Uncle    Cancer Maternal Grandfather    Lupus Paternal Grandmother    Hypertension Brother  Social History   Tobacco Use   Smoking status: Never   Smokeless tobacco: Never  Vaping Use   Vaping Use: Never used  Substance Use Topics   Alcohol use: Not Currently   Drug use: Not Currently    Types: Marijuana    Comment: pain managment last used in early april     Allergies  Allergen Reactions   Penicillin G Itching, Rash, Anaphylaxis and Palpitations   Lamictal [Lamotrigine] Rash   Carbamazepine Other (See Comments)    Medication interaction-prograf   Hydrocodone-Acetaminophen Itching   Naproxen Itching    Health Maintenance  Topic Date Due   PAP SMEAR-Modifier  Never done   COVID-19 Vaccine (3 - Pfizer risk series) 02/21/2020   HEMOGLOBIN A1C  06/13/2021   COLONOSCOPY (Pts 45-72yr Insurance coverage will need to be confirmed)  12/11/2021 (Originally 10/18/2018)   TETANUS/TDAP  12/11/2021 (Originally 10/18/1992)   FOOT EXAM  12/11/2021   OPHTHALMOLOGY EXAM  07/28/2022   Pneumococcal Vaccine 155636Years old (4 - PPSV23 if available, else PCV20) 10/18/2038   INFLUENZA VACCINE  Completed   Hepatitis C Screening  Completed   HIV Screening  Completed   HPV VACCINES  Aged Out    Chart Review Today: I personally reviewed active problem list, medication list, allergies, family history, social history, health maintenance, notes from last encounter, lab results, imaging with the patient/caregiver today.   Review of Systems  Constitutional: Negative.   HENT: Negative.    Eyes: Negative.   Respiratory: Negative.    Cardiovascular: Negative.   Gastrointestinal:  Positive for nausea. Negative for abdominal pain and vomiting.  Endocrine: Negative.   Genitourinary: Negative.   Musculoskeletal: Negative.   Skin: Negative.   Allergic/Immunologic: Negative.   Neurological: Negative.   Hematological: Negative.    Psychiatric/Behavioral: Negative.    All other systems reviewed and are negative.   Objective:   Vitals:   08/29/21 1343  BP: 124/76  Pulse: 88  Resp: 16  Temp: 97.6 F (36.4 C)  TempSrc: Oral  SpO2: 96%  Weight: 252 lb 9.6 oz (114.6 kg)  Height: 5' 6"  (1.676 m)    Body mass index is 40.77 kg/m.  Physical Exam Vitals and nursing note reviewed.  Constitutional:      General: She is not in acute distress.    Appearance: Normal appearance. She is well-developed. She is obese. She is not ill-appearing, toxic-appearing or diaphoretic.     Interventions: Face mask in place.  HENT:     Head: Normocephalic and atraumatic.     Right Ear: External ear normal.     Left Ear: External ear normal.  Eyes:     General: Lids are normal. No scleral icterus.       Right eye: No discharge.        Left eye: No discharge.     Conjunctiva/sclera: Conjunctivae normal.  Neck:     Trachea: Phonation normal. No tracheal deviation.  Cardiovascular:     Rate and Rhythm: Normal rate and regular rhythm.     Pulses: Normal pulses.          Radial pulses are 2+ on the right side and 2+ on the left side.       Posterior tibial pulses are 2+ on the right side and 2+ on the left side.     Heart sounds: Normal heart sounds. No murmur heard.   No friction rub. No gallop.  Pulmonary:     Effort: Pulmonary effort is normal. No  respiratory distress.     Breath sounds: Normal breath sounds. No stridor. No wheezing, rhonchi or rales.  Chest:     Chest wall: No tenderness.  Abdominal:     General: Bowel sounds are normal. There is no distension.     Palpations: Abdomen is soft.  Musculoskeletal:     Right lower leg: No edema.     Left lower leg: No edema.  Skin:    General: Skin is warm and dry.     Coloration: Skin is not jaundiced or pale.     Findings: No rash.  Neurological:     Mental Status: She is alert.     Motor: No abnormal muscle tone.     Gait: Gait normal.  Psychiatric:         Attention and Perception: Attention and perception normal.        Mood and Affect: Mood normal. Affect is flat.        Speech: Speech normal.        Behavior: Behavior normal. Behavior is cooperative.    ECG interpretation   Date: 08/29/21  Rate: 78  Rhythm: sinus rhythm  QRS Axis: left  Intervals: increased QRS, prolonged QTc  ST/T Wave abnormalities: normal  Conduction Disutrbances: RBBB, LAFB  Narrative Interpretation:   Old EKG Reviewed: 11/03/20 No significant changes noted except for rate (jan ECG pt was tachycardic) and prior ECG showed worse QTc      Assessment & Plan:     ICD-10-CM   1. Essential hypertension  I10 lisinopril (ZESTRIL) 20 MG tablet   BP at goal today, refills given on lisinopril after recent labs were reviewed, pt has good compliance, no SE or concerns    2. Stage 3 chronic kidney disease, unspecified whether stage 3a or 3b CKD (HCC)  N18.30     3. Morbid obesity ([place])  E66.01     4. Hyperlipidemia, unspecified hyperlipidemia type  E78.5    not on statin?  interaction with prograft?   will get records from endocrinology and reach out to transplant team and pharmacy    5. Type 2 diabetes mellitus with hyperglycemia, without long-term current use of insulin (HCC)  E11.65    per specialist - not at goal    6. Need for influenza vaccination  Z23 Flu Vaccine QUAD 6+ mos PF IM (Fluarix Quad PF)    7. Stage 3a chronic kidney disease (HCC)  N18.31     8. Systemic lupus erythematosus (SLE) in adult ([place])  M32.9     9. Fibromyalgia  M79.7     10. 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HCC)  C85.80     11. Opioid dependence with opioid-induced disorder ([place])  F11.29     12. 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[place])  D47.Z1     13. Bipolar disorder, in full remission, most recent episode mixed ([place])  F31.78    per psychiatry, phq score high today, pt wants to changes meds, encouraged to reach out to Dr. Shea Evans    14. S/P liver transplant ([place])   Z94.4     15. Uncontrolled type 2 diabetes mellitus with hyperglycemia (HCC)  E11.65 lisinopril (ZESTRIL) 20 MG tablet    16. Prolonged Q-T interval on ECG  R94.31 EKG 12-Lead   evident on prior ECG, ECG done today shows QTc 469 - polypharmacy - printed info for pt, will not refill phenegan, will route note to psychiatrist     17. Dysuria  R30.0 POCT Urinalysis Dipstick  Urine Culture    nitrofurantoin, macrocrystal-monohydrate, (MACROBID) 100 MG capsule    CANCELED: Urine Culture    CANCELED: Urine Culture   pt feels like she is having the start of UTI, scant blood on dip, neg nitrites and leuk, sent for culture, given pt hx sent in abx, avoid meds that affect qtc    18. History of UTI  Z87.440 POCT Urinalysis Dipstick    Urine Culture    nitrofurantoin, macrocrystal-monohydrate, (MACROBID) 100 MG capsule    CANCELED: Urine Culture    CANCELED: Urine Culture    19. High risk medications (not anticoagulants) long-term use  Z79.899 EKG 12-Lead   polypharmacy, narcotic, will reach out to [facility] pharmacy and other specialists, also need to update med list and review records from providers out of system    20. Medication refill  Z76.0    request phenergan - not refilled, advised to try pepcid      Return in about 6 months (around 02/26/2022) for get routine f/up visit HTN and establish with PCP,  come sooner for nausea/vomiting prn.     [name], PA-C 08/29/21 1:59 PM

## 2021-08-29 NOTE — Patient Instructions (Addendum)
Please follow up with Dr. Shea Evans about your meds you wish to have changed - I will notify Dr. Johnette Abraham about the info below.   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 and meds below interact and can contribute to this and worsen it and it can be life threatening  Try pepcid over the counter for settling your stomach and I will try to research and consult with pharmacy about other medications to help manage nausea symptoms   Medications     Ordered     Start Stop    07/04/21 1701   venlafaxine XR (EFFEXOR-XR) 37.5 MG 24 hr capsule       Note to Pharmacy: Pt is requesting a 90 day supply  References:    [facility] Drug Information   07/04/21 0000 --    01/19/21 0512   ondansetron (ZOFRAN ODT) 4 MG disintegrating tablet  4 mg,   Oral,   Every 8 hours PRN       References:    [facility] Drug Information   01/19/21 0000 --    01/17/21 1431   SULFAMETHOXAZOLE-TRIMETHOPRIM 800-160 MG PO TABS  1 tablet,   Oral,   Every 12 hours       References:    [facility] Drug Information   01/17/21 1445 --    12/22/20 1504   mirabegron ER (MYRBETRIQ) 50 MG TB24 tablet  50 mg,   Oral,   Daily       References:    [facility] Drug Information   12/22/20 0000 --    12/04/20 1641   promethazine (PHENERGAN) 25 MG tablet  25 mg,   Oral,   As needed       References:    [facility] Drug Information   12/04/20 0000 --    07/08/19 0710   tacrolimus (PROGRAF) 1 MG capsule       References:    [facility] Drug Information   07/08/19 0000 --      Long QT Syndrome Long QT syndrome (LQTS) is a heart condition in which the heart takes longer than normal to recharge after each heartbeat. This is caused by an abnormal electrical system in the heart. LQTS can upset the timing of your heartbeats. It can cause dangerous changes in your heart rate and rhythm (arrhythmia). There are several types of LQTS. The three most common types are: Type 1. This can be triggered by stress or exercise, especially swimming. Type 2. This can  be triggered by strong emotions or surprise. Type 3. This can be triggered when your heart slows during sleep. You can be born with LQTS, or you can develop it later in life. What are the causes? The cause of this condition depends on the type of LQTS that you have. Inherited LQTS. You are born with this condition. It is caused by an abnormal gene that is passed down through your family. Acquired LQTS. You get this condition later in life. It may be caused by:  Certain medicines that affect your heartbeat. Some examples are methadone and antihistamines. Long periods of vomiting or diarrhea. An eating disorder. A thyroid disorder. What increases the risk? This condition is more likely to develop in: People who are born deaf. Women. People who have an eating disorder, such as anorexia nervosa or bulimia. People who have a family member with LQTS. People who have a family history of unexplained fainting, drowning, or sudden death. What are the signs or symptoms? Symptoms of this condition include: Fainting.  A fluttering feeling in your chest. Loud gasping during sleep. Seizures. Symptoms of inherited LQTS almost always start before age 34.  Some people with this condition have no symptoms. How is this diagnosed? This condition may be diagnosed based on: Your symptoms. Your medical history and family history. A physical exam. Some tests, including: An electrocardiogram (ECG) to measure electrical activity in your heart. Holter monitoring to record your heartbeat for 1-2 days. Stress test to record your heartbeat while you exercise. A blood test to look for genes that cause LQTS. How is this treated? There is no cure for this condition. Treatment depends on the cause, your symptoms, and whether you have a family history of sudden death. Treatment may include: Making lifestyle changes, such as avoiding competitive sports or stressful situations. Taking supplements to correct abnormal  salt (sodium), potassium, calcium, and magnesium levels. Stopping the use of a medicine. Do not stop the use of medicines without first talking to your health care provider. Taking heart medicines, such as beta blockers. Implanting a device that corrects a dangerous heartbeat, such as: A pacemaker. This helps your heart beat in a normal rhythm. A cardioverter-defibrillator. This senses a fast heartbeat and shocks the heart to restore normal heart rate. Having heart surgery to prevent arrhythmias. Follow these instructions at home: Medicine Take over-the-counter and prescription medicines only as told by your health care provider. If you want to take any new medicine, get approval from your health care provider first. Avoid any medicines that can cause this condition. Lifestyle Make any lifestyle changes that are recommended by your health care provider. You may need to avoid: Competitive sports. Strenuous exercises and activities, such as swimming. Stress. Situations where sudden loud noises are likely. If you drink alcohol, limit how much you have: 0-2 drinks a day for men. 0-1 drink a day for women. Be aware of how much alcohol is in your drink. In the [place] one drink equals one typical bottle of beer (12 oz), one-half glass of wine (5 oz), or one shot of hard liquor (1 oz). Do not use any products that contain nicotine or tobacco, such as cigarettes and e-cigarettes. If you need help quitting, ask your health care provider. General instructions Develop a plan with your health care provider for how to deal with a sudden arrhythmia. Tell people who live with you about the signs of a sudden arrhythmia. Wear a medical ID necklace or bracelet that states your diagnosis and contact information. Have an automated external defibrillator (AED) available at home or work. Get treatment and support if you feel stress, fear, anxiety, or depression. Keep all follow-up visits as told by your health  care provider. This is important. Contact a health care provider if: You are suffering from stress, fear, anxiety, or depression. You vomit. You have diarrhea. Get help right away if: You have chest pain or difficulty breathing. You have a fluttering feeling in your chest. You faint. You have a seizure. These symptoms may represent a serious problem that is an emergency. Do not wait to see if the symptoms will go away. Get medical help right away. Call your local emergency services (911 in the [place] Do not drive yourself to the hospital. Summary Long QT syndrome (LQTS) is a heart condition in which your heart takes longer than normal to recharge after each heartbeat. This is caused by an abnormal electrical system in your heart. LQTS can upset the timing of your heartbeats and cause dangerous changes in your heart  rate and rhythm (arrhythmia). Some people are born with LQTS (inherited). Others develop it later in life (acquired). Some people with this condition have no symptoms. Those who do have symptoms may experience fainting, a fluttering feeling in the chest, loud gasping during sleep, or seizures. There is no cure for this condition. Treatment depends on the cause, your symptoms, and whether you have a family history of sudden death. This information is not intended to replace advice given to you by your health care provider. Make sure you discuss any questions you have with your health care provider. Document Revised: 10/20/2017 Document Reviewed: 10/21/2017 Elsevier Patient Education  2022 [employer].

## 2021-08-30 ENCOUNTER — Encounter: Payer: Self-pay | Admitting: Family Medicine

## 2021-08-31 ENCOUNTER — Encounter: Payer: Self-pay | Admitting: Family Medicine

## 2021-08-31 DIAGNOSIS — R9431 Abnormal electrocardiogram [ECG] [EKG]: Secondary | ICD-10-CM

## 2021-08-31 LAB — URINE CULTURE
MICRO NUMBER:: 12676865
SPECIMEN QUALITY:: ADEQUATE

## 2021-09-03 ENCOUNTER — Ambulatory Visit: Payer: Medicaid Other | Admitting: Psychiatry

## 2021-09-03 NOTE — Telephone Encounter (Signed)
Pt was told in mychart msg to f/up with her psychiatrist about meds and QTc

## 2021-09-03 NOTE — Telephone Encounter (Signed)
Pt was notified and also that cardiology referral has been put in.

## 2021-09-04 ENCOUNTER — Telehealth: Payer: Self-pay

## 2021-09-04 ENCOUNTER — Encounter: Payer: Self-pay | Admitting: Psychiatry

## 2021-09-04 ENCOUNTER — Other Ambulatory Visit: Payer: Self-pay

## 2021-09-04 ENCOUNTER — Ambulatory Visit (INDEPENDENT_AMBULATORY_CARE_PROVIDER_SITE_OTHER): Payer: Medicaid Other | Admitting: Psychiatry

## 2021-09-04 VITALS — BP 152/88 | HR 78 | Temp 98.7°F | Wt 249.6 lb

## 2021-09-04 DIAGNOSIS — F4310 Post-traumatic stress disorder, unspecified: Secondary | ICD-10-CM | POA: Diagnosis not present

## 2021-09-04 DIAGNOSIS — F411 Generalized anxiety disorder: Secondary | ICD-10-CM

## 2021-09-04 DIAGNOSIS — G4700 Insomnia, unspecified: Secondary | ICD-10-CM

## 2021-09-04 DIAGNOSIS — G2111 Neuroleptic induced parkinsonism: Secondary | ICD-10-CM | POA: Diagnosis not present

## 2021-09-04 DIAGNOSIS — R9431 Abnormal electrocardiogram [ECG] [EKG]: Secondary | ICD-10-CM

## 2021-09-04 DIAGNOSIS — F3178 Bipolar disorder, in full remission, most recent episode mixed: Secondary | ICD-10-CM | POA: Diagnosis not present

## 2021-09-04 MED ORDER — QUETIAPINE FUMARATE ER 300 MG PO TB24: 300.0000 mg | ORAL_TABLET | Freq: Every day | ORAL | 0 refills | Status: DC

## 2021-09-04 MED ORDER — QUETIAPINE FUMARATE ER 50 MG PO TB24: 50.0000 mg | ORAL_TABLET | Freq: Every day | ORAL | 0 refills | Status: DC

## 2021-09-04 MED ORDER — ESZOPICLONE 1 MG PO TABS: 1.0000 mg | ORAL_TABLET | Freq: Every evening | ORAL | 0 refills | Status: DC | PRN

## 2021-09-04 NOTE — Telephone Encounter (Signed)
went online to [email or web address] and submited the prior auth - approved pa case # [id number] from  09-04-21 to the 09-04-22

## 2021-09-04 NOTE — Patient Instructions (Signed)
Eszopiclone Tablets What is this medication? ESZOPICLONE (es ZOE pi clone) treats insomnia. It helps you go to sleep faster and stay asleep through the night. It is often used for a short period of time. This medicine may be used for other purposes; ask your health care provider or pharmacist if you have questions. COMMON BRAND NAME(S): Lunesta What should I tell my care team before I take this medication? They need to know if you have any of these conditions: Depression Liver disease Lung or breathing disease, such as asthma or COPD Substance use disorder Sleep-walking, driving, eating or other activity while not fully awake after taking a sleep medication Suicidal thoughts, plans, or attempt by you or a family member An unusual or allergic reaction to eszopiclone, other medications, foods, dyes, or preservatives Pregnant or trying to get pregnant Breast-feeding How should I use this medication? Take this medication by mouth with a glass of water. Follow the directions on the prescription label. It is better to take this medication on an empty stomach and only when you are ready for bed. Do not take your medication more often than directed. If you have been taking this medication for several weeks and suddenly stop taking it, you may get unpleasant withdrawal symptoms. Your care team may want to gradually reduce the dose. Do not stop taking this medication on your own. Always follow your care team's advice. A special MedGuide will be given to you by the pharmacist with each prescription and refill. Be sure to read this information carefully each time. Talk to your care team about the use of this medication in children. Special care may be needed. Overdosage: If you think you have taken too much of this medicine contact a poison control center or emergency room at once. NOTE: This medicine is only for you. Do not share this medicine with others. What if I miss a dose? This does not apply. This  medication should only be taken immediately before going to sleep. Do not take double or extra doses. What may interact with this medication? Herbal medications like kava kava, melatonin, St. John's Wort and valerian Lorazepam Medications for fungal infections like ketoconazole, fluconazole, or itraconazole Olanzapine This list may not describe all possible interactions. Give your health care provider a list of all the medicines, herbs, non-prescription drugs, or dietary supplements you use. Also tell them if you smoke, drink alcohol, or use illegal drugs. Some items may interact with your medicine. What should I watch for while using this medication? Visit your care team for regular checks on your progress. Keep a regular sleep schedule by going to bed at about the same time nightly. Avoid caffeine-containing drinks in the evening hours, as caffeine can cause trouble with falling asleep. Talk to your care team if you still have trouble sleeping. You may do unusual sleep behaviors or activities you do not remember the day after taking this medication. Activities include driving, making or eating food, talking on the phone, sexual activity, or sleep walking. Stop taking this medication and call your care team right away if you find out you have done activities like this. Plan to go to bed and stay in bed for a full night (7 to 8 hours) after you take this medication. You may still be drowsy the morning after taking this medication. This medication may affect your coordination, reaction time, or judgment. Do not drive or operate machinery until you know how this medication affects you. Sit up or stand slowly to  reduce the risk of dizzy or fainting spells. If you or your family notice any changes in your behavior, such as new or worsening depression, thoughts of harming yourself, anxiety, other unusual or disturbing thoughts, or memory loss, call your care team right away. After you stop taking this  medication, you may have trouble falling asleep. This is called rebound insomnia. This problem usually goes away on its own after 1 or 2 nights. What side effects may I notice from receiving this medication? Side effects that you should report to your care team as soon as possible: Allergic reactions or angioedema--skin rash, itching, hives, swelling of the face, eyes, lips, tongue, arms, or legs, trouble swallowing or breathing CNS depression--slow or shallow breathing, shortness of breath, feeling faint, dizziness, confusion, trouble staying awake Mood and behavior changes--anxiety, nervousness, confusion, hallucinations, irritability, hostility, thoughts of suicide or self-harm, worsening mood, feelings of depression Unusual sleep behaviors or activities you do not remember such as driving, eating, or sexual activity Side effects that usually do not require medical attention (report to your care team if they continue or are bothersome): Dizziness Drowsiness the day after use Dry mouth Headache Metallic taste in mouth This list may not describe all possible side effects. Call your doctor for medical advice about side effects. You may report side effects to FDA at 1-800-FDA-1088. Where should I keep my medication? Keep out of the reach of children. This medication can be abused. Keep your medication in a safe place to protect it from theft. Do not share this medication with anyone. Selling or giving away this medication is dangerous and against the law. This medication may cause accidental overdose and death if taken by other adults, children, or pets. Mix any unused medication with a substance like cat litter or coffee grounds. Then throw the medication away in a sealed container like a sealed bag or a coffee can with a lid. Do not use the medication after the expiration date. Store at room temperature between 15 and 30 degrees C (59 and 86 degrees F). NOTE: This sheet is a summary. It may not  cover all possible information. If you have questions about this medicine, talk to your doctor, pharmacist, or health care provider.  2022 Elsevier/Gold Standard (2021-04-30 00:00:00)

## 2021-09-04 NOTE — Telephone Encounter (Signed)
a prior Alison [name] is needed for the eszopiclone.

## 2021-09-04 NOTE — Progress Notes (Signed)
[facility] MD OP Progress Note  09/04/2021 5:04 PM Alison [name]  MRN:  [id number]  Chief Complaint:  Chief Complaint   Follow-up; Anxiety; Depression    HPI: Alison [name] is a 45 year old Caucasian female, lives in [place], has a history of bipolar disorder, PTSD, multiple medical problems including fibromyalgia, chronic pain, trigeminal neuralgia, history of liver transplant, B-cell lymphoma, SLE, diabetes melitis, OSA, migraine headache was evaluated in office today.  Patient today reports she was recently diagnosed with prolonged QT interval on ECG.  Writer has not been able to review this ECG since it is not part of her chart at this time.  Patient however is on multiple current medications which does have an impact on her QT intervals.  This was discussed with patient.  Patient is agreeable to holding the Seroquel for now.  Patient does have sleep problems.  She has tried multiple medications including Ambien in the past, had side effects to Ambien.  She has not tried [place] in the past.  Agreeable for trial.  Overall mood wise patient is currently stable although she is anxious about her multiple health problems.  Denies any mood swings.  Denies suicidality, homicidality or perceptual disturbances.  Patient denies any other concerns today.  Visit Diagnosis:    ICD-10-CM   1. Bipolar disorder, in full remission, most recent episode mixed (HCC)  F31.78 QUEtiapine (SEROQUEL XR) 50 MG TB24 24 hr tablet    2. PTSD (post-traumatic stress disorder)  F43.10 QUEtiapine (SEROQUEL XR) 300 MG 24 hr tablet    3. GAD (generalized anxiety disorder)  F41.1     4. 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 Insomnia, unspecified type  G47.00 eszopiclone (LUNESTA) 1 MG TABS tablet    6. 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   Chemo tx's   Migraine    Morbid obesity (HCC)    Neuromuscular disorder (HCC)    neuropathy   Neuropathy    Personality disorder ([place])    Post herpetic neuralgia    PTSD (post-traumatic stress disorder)    Renal disorder    S/P liver transplant [facility])     Past Surgical History:  Procedure Laterality Date   BONE MARROW BIOPSY  01/13/2015   BREAST BIOPSY  12/2014   BREAST BIOPSY  2011   BREAST SURGERY     CHOLECYSTECTOMY     COLONOSCOPY     ESOPHAGOGASTRODUODENOSCOPY     ESOPHAGOGASTRODUODENOSCOPY (EGD) WITH PROPOFOL N/A 01/09/2021   Procedure: ESOPHAGOGASTRODUODENOSCOPY (EGD) WITH PROPOFOL;  Surgeon: [name], MD;  Location: [facility] ENDOSCOPY;  Service: Endoscopy;  Laterality: N/A;   HERNIA REPAIR     [facility]     LIVER TRANSPLANT  12/17/1991   LUMBAR PUNCTURE     PORT A CATH INJECTION ([place] [facility]X)     tumor removal  2015    Family Psychiatric History: Reviewed family psychiatric history from progress note on 06/10/2019  Family History:  Family History  Problem Relation Age of Onset   Heart disease Mother    Hypertension Mother    Cancer - Other Mother    Bipolar disorder Mother    Heart disease Father    Hypertension Father    Diabetes Father    Parkinson's disease Maternal Grandmother    Cancer Maternal Aunt    Cancer Maternal Uncle    Cancer Maternal Grandfather    Lupus  Paternal Grandmother    Hypertension Brother     Social History: Reviewed social history from progress note on 06/10/2019 Social History   Socioeconomic History   Marital status: Single    Spouse name: Not on file   Number of children: 0   Years of education: 9   Highest education level: GED or equivalent  Occupational History   Occupation: disabled  Tobacco Use   Smoking status: Never   Smokeless tobacco: Never  Vaping Use   Vaping Use: Never used  Substance and Sexual Activity   Alcohol use: Not Currently   Drug use: Not Currently    Types: Marijuana    Comment: pain managment last used in early april   Sexual activity: Not Currently  Other Topics Concern   Not on file  Social History Narrative   Not on file   Social Determinants of Health   Financial Resource Strain: Low Risk    Difficulty of Paying Living Expenses: Not hard at all  Food Insecurity: No Food Insecurity   Worried About Charity fundraiser in the Last Year: Never true   [place] in the Last Year: Never true  Transportation Needs: No Transportation Needs   Lack of Transportation (Medical): No   Lack of Transportation (Non-Medical): No  Physical Activity: Insufficiently Active   Days of Exercise per Week: 1 day   Minutes of Exercise per Session: 30 min  Stress: Stress Concern Present   Feeling of Stress : Rather much  Social Connections: Moderately Integrated   Frequency of Communication with Friends and Family: More than three times a week   Frequency of Social Gatherings with Friends and Family: More than three times a week   Attends Religious Services: More than 4 times per year   Active Member of [employer] or Organizations: No   Attends Archivist Meetings: Never   Marital Status: Living with partner    Allergies:  Allergies  Allergen Reactions   Penicillin G Itching, Rash, Anaphylaxis and Palpitations   Lamictal [Lamotrigine] Rash   Carbamazepine Other (See Comments)     Medication interaction-prograf   Hydrocodone-Acetaminophen Itching   Naproxen Itching    Metabolic Disorder Labs: Lab Results  Component Value Date   HGBA1C 8.9 (A) 12/11/2020   MPG 209 06/05/2020   Lab Results  Component Value Date   PROLACTIN 13.4 07/15/2019   PROLACTIN 21.3 05/24/2019   Lab Results  Component Value Date   CHOL 219 (H) 12/11/2020   TRIG 333 (H) 12/11/2020   HDL 38 (L) 12/11/2020   CHOLHDL 5.8 (H) 12/11/2020   LDLCALC 133 (H) 12/11/2020   LDLCALC 111 (H) 06/05/2020   Lab Results  Component Value Date   TSH 2.650 07/15/2019   TSH 4.650 (H) 05/24/2019    Therapeutic Level Labs: No results found for: LITHIUM No results found for: VALPROATE No components found for:  CBMZ  Current Medications: Current Outpatient Medications  Medication Sig Dispense Refill   Accu-Chek FastClix Lancets MISC Apply topically 2 (two) times daily.     baclofen (LIORESAL) 10 MG tablet Take 10 mg by mouth 2 (two) times daily.     benztropine (COGENTIN) 1 MG tablet Take 1 tablet (1 mg total) by mouth daily as needed for tremors. 30 tablet 1   Dulaglutide (TRULICITY) 6.94 WN/4.6EV SOPN Inject 0.75 mg into the skin once a week. (Patient taking differently: Inject 0.75 mg into the skin every Wednesday.) 3 mL 3   ergocalciferol (VITAMIN D2) 1.25 MG (50000 UT) capsule Take 1 capsule by mouth once a week.     eszopiclone (LUNESTA) 1 MG TABS tablet Take 1 tablet (1 mg total) by mouth at bedtime as needed for sleep. Take immediately before bedtime 15 tablet 0   ezetimibe (ZETIA) 10 MG tablet Take 10 mg by mouth daily.     glucose blood (COOL BLOOD GLUCOSE TEST STRIPS) test strip Use as instructed 100 each 12   icosapent Ethyl (VASCEPA) 1 g capsule Take by mouth.     lisinopril (ZESTRIL) 20 MG tablet Take 1 tablet (20 mg total) by mouth daily. 90 tablet 3   Magnesium 500 MG CAPS Take by mouth.     Melatonin 10 MG TABS Take 10 mg by mouth at bedtime as needed (sleep).     metFORMIN  (GLUCOPHAGE) 1000 MG tablet Take 1 tablet (1,000 mg total) by mouth 2 (two) times daily with a meal. 180 tablet 3   mirabegron ER (MYRBETRIQ) 50 MG TB24 tablet Take 1 tablet (50 mg total) by mouth daily. 90 tablet 3   Multiple Vitamin (MULTI-VITAMIN) tablet Take 1 tablet by mouth daily.     nitrofurantoin, macrocrystal-monohydrate, (MACROBID) 100 MG capsule Take 1 capsule (100 mg total) by mouth 2 (two) times daily. 10 capsule 0   oxyCODONE ER (XTAMPZA ER) 13.5 MG C12A Take 1 tablet by mouth 2 (two) times daily.     pregabalin (LYRICA) 200 MG capsule Take 200 mg by mouth 2 (two) times daily.     tacrolimus (PROGRAF) 1 MG capsule Take 1 capsule by mouth once daily (Patient taking differently: Take 1 mg by mouth 1 day or 1 dose.) 30 capsule 0   venlafaxine XR (EFFEXOR-XR) 37.5 MG 24 hr capsule TAKE ONE CAPSULE BY MOUTH DAILY WITH BREAKFAST 90 capsule 0   XYLIMELTS 550 MG DISK USE one BY MOUTH EVERY 8 HOURS AS NEEDED FOR DRY MOUTH     QUEtiapine (SEROQUEL XR) 300 MG 24 hr tablet Take 1 tablet (300 mg total) by mouth at bedtime. 90 tablet 0   QUEtiapine (SEROQUEL XR) 50 MG TB24 24 hr tablet Take 1 tablet (50 mg total) by mouth at bedtime. Take along with 300 mg at bedtime - HOLD UNTIL CARDIOLOGY EVALUATION 90 tablet 0   Current Facility-Administered Medications  Medication Dose Route Frequency Provider Last Rate Last Admin   sulfamethoxazole-trimethoprim (BACTRIM DS) 800-160 MG per tablet 1 tablet  1 tablet Oral Q12H Hollice Espy, MD   1 tablet at 01/17/21 1432     Musculoskeletal: Strength & Muscle Tone: within normal limits Gait & Station: normal Patient leans: N/A  Psychiatric Specialty Exam: Review of Systems  Psychiatric/Behavioral:  Positive for sleep disturbance.  The patient is nervous/anxious.   All other systems reviewed and are negative.  Blood pressure (!) 152/88, pulse 78, temperature 98.7 F (37.1 C), temperature source Temporal, weight 249 lb 9.6 oz (113.2 kg), last menstrual  period 02/04/2018.Body mass index is 40.29 kg/m.  General Appearance: Casual  Eye Contact:  Fair  Speech:  Clear and Coherent  Volume:  Normal  Mood:  Anxious  Affect:  Appropriate  Thought Process:  Goal Directed and Descriptions of Associations: Intact  Orientation:  Full (Time, Place, and Person)  Thought Content: Logical   Suicidal Thoughts:  No  Homicidal Thoughts:  No  Memory:  Immediate;   Fair Recent;   Fair Remote;   Fair  Judgement:  Fair  Insight:  Fair  Psychomotor Activity:  Normal  Concentration:  Concentration: Fair and Attention Span: Fair  Recall:  [employer] of Knowledge: Fair  Language: Fair  Akathisia:  No  Handed:  Right  AIMS (if indicated): done, 0  Assets:  Communication Skills Desire for Improvement Housing Social Support  ADL's:  Intact  Cognition: WNL  Sleep:  Poor   Screenings: AIMS    Flowsheet Row Video Visit from 05/09/2021 in [place] Total Score 0      [place] Office Visit from 08/29/2021 in [facility] Video Visit from 05/28/2021 in [place] Video Visit from 05/09/2021 in [facility] Visit from 12/04/2020 in [facility]  Total GAD-7 Score _0 PHQ2-9    [place] Visit from 09/04/2021 in [facility] from 08/29/2021 in [facility] Office Visit from 07/20/2021 in [place] [facility] from 03/28/2021 in [place] Video Visit from 02/07/2021 in [place]  PHQ-2 Total Score _1 PHQ-9 Total Score _2 [place] Office Visit from 09/04/2021 in [facility] Visit from 07/20/2021 in [place] Nutrition from 03/28/2021 in [place] No Risk No Risk Error: Question 6 not populated        Assessment and Plan: Alison [name] is a 45 year old Caucasian female on disability, has a history of bipolar disorder, PTSD, history of borderline personality disorder, chronic pain, fibromyalgia, SLE, diabetes, OSA, migraine headache, recent right sided swelling of face was evaluated in office today.  Patient with recent QT prolongation on her EKG, will benefit from medication changes as noted below.  Plan as noted below.  Plan Bipolar disorder mixed in remission Will hold Seroquel for now.  Patient will benefit from cardiology clearance, has been referred by her primary care provider. Continue venlafaxine extended release 37.5 mg p.o. daily-this being a low dose. We will consider adding a mood stabilizer as needed in the future.  GAD-stable Venlafaxine extended release 37.5 mg p.o. daily  Neuroleptic induced Parkinson's disease-stable Cogentin 1 mg p.o. daily as needed  PTSD-stable We will monitor closely Continue venlafaxine as prescribed  Insomnia-unstable Patient to continue to work on sleep hygiene Patient had sleep study completed-pending report. Start Lunesta 1 mg p.o. nightly. Patient failed multiple medication trials including Ambien to which she had side effects.  Patient also with prolonged QT syndrome, will have to be cautious with medication choices.  Patient with elevated blood pressure reading  in session today, reports she ran out of her lisinopril and just got her medication refilled today.  Patient to follow up with primary care provider.  Discussed with patient to sign a release to obtain cardiology clearance report.  Follow-up in clinic in 1 week or sooner if needed.  This note was generated in part or whole with voice recognition software. Voice recognition is usually quite accurate but there are transcription errors that can and very often do occur. I  apologize for any typographical errors that were not detected and corrected.     [name], MD 09/05/2021, 9:33 AM

## 2021-09-05 ENCOUNTER — Encounter: Payer: Self-pay | Admitting: Family Medicine

## 2021-09-05 DIAGNOSIS — M79662 Pain in left lower leg: Secondary | ICD-10-CM | POA: Diagnosis not present

## 2021-09-05 DIAGNOSIS — M79622 Pain in left upper arm: Secondary | ICD-10-CM | POA: Diagnosis not present

## 2021-09-05 DIAGNOSIS — M545 Low back pain, unspecified: Secondary | ICD-10-CM | POA: Diagnosis not present

## 2021-09-05 DIAGNOSIS — M79621 Pain in right upper arm: Secondary | ICD-10-CM | POA: Diagnosis not present

## 2021-09-05 DIAGNOSIS — M79604 Pain in right leg: Secondary | ICD-10-CM | POA: Diagnosis not present

## 2021-09-05 DIAGNOSIS — Z79891 Long term (current) use of opiate analgesic: Secondary | ICD-10-CM | POA: Diagnosis not present

## 2021-09-06 ENCOUNTER — Other Ambulatory Visit: Payer: Self-pay

## 2021-09-06 ENCOUNTER — Encounter: Payer: Self-pay | Admitting: Family Medicine

## 2021-09-06 ENCOUNTER — Other Ambulatory Visit: Payer: Self-pay | Admitting: Obstetrics and Gynecology

## 2021-09-06 DIAGNOSIS — K7460 Unspecified cirrhosis of liver: Secondary | ICD-10-CM | POA: Diagnosis not present

## 2021-09-06 DIAGNOSIS — I8510 Secondary esophageal varices without bleeding: Secondary | ICD-10-CM | POA: Diagnosis not present

## 2021-09-06 DIAGNOSIS — Z944 Liver transplant status: Secondary | ICD-10-CM | POA: Diagnosis not present

## 2021-09-06 DIAGNOSIS — E1165 Type 2 diabetes mellitus with hyperglycemia: Secondary | ICD-10-CM

## 2021-09-06 DIAGNOSIS — K7581 Nonalcoholic steatohepatitis (NASH): Secondary | ICD-10-CM | POA: Diagnosis not present

## 2021-09-06 NOTE — Patient Instructions (Signed)
Hi Alison [name], thank you for speaking with me, I hope your appointment goes well this afternoon.  Alison [name] was given information about Medicaid Managed Care team care coordination services as a part of their Healthy [facility] Medicaid benefit. Alison [name] verbally consented to engagement with the [facility] Managed Care team.   If you are experiencing a medical emergency, please call 911 or report to your local emergency department or urgent care.   If you have a non-emergency medical problem during routine business hours, please contact your provider's office and ask to speak with a nurse.   For questions related to your Healthy [facility] health plan, please call: [phone] or visit the homepage here: [email or web address]  If you would like to schedule transportation through your Healthy [facility] plan, please call the following number at least 2 days in advance of your appointment: [phone]  Call the [place] at [phone], at any time, 24 hours a day, 7 days a week. If you are in danger or need immediate medical attention call 911.  If you would like help to quit smoking, call 1-800-QUIT-NOW (716) 150-7754) OR Espaol: 1-855-Djelo-Ya (1-540-086-7619) o para ms informacin haga clic aqu or Text READY to 200-400 to register via text  Alison [name] - following are the goals we discussed in your visit today:   Goals Addressed             This Visit's Progress    Chronic Pain Managed       Update 09/06/21:  patient states pain is stable and managed.   Evidence-based guidance:  Address common beliefs about pain, such as pain is to be endured, a normal part of aging or that it is not "real"; feelings of resignation that nothing can be done and that complaining will be a sign of weakness.  Assess pain level, treatment efficacy and patient response at regular intervals using a consistent pain scale.  Assess pain  using self-report (most reliable), family/caregiver report, validated pain scale; consider impact on quality of life.  Determine if pain is associated with mobility or at rest, location, intensity, frequency, duration, recurrence, pattern and description (e.g., cramping, burning, aching), triggers and relieving factors.   Anticipate referral to pain education program, pain management support or community resources for specific diagnoses (e.g., cancer, fibromyalgia, multiple sclerosis).  Explore fears associated with anticipated or imagined pain; encourage acceptance-based approaches.  Encourage exposure to experiences previously avoided due to fear of pain.  Anticipate referral to pain management specialist, physical therapist, addiction specialist (if history of substance use), psychotherapist; advocate for consultation with pharmacist.  Initiate nonpharmacologic measures, such as cognitive behavior therapy, mindfulness, guided imagery, massage, distraction, relaxation, chiropractic manipulation, dietary supplements or acupuncture.  Provide multimodal treatment interventions, such as physical activity, therapeutic exercise, yoga, TENS (transcutaneous electrical nerve stimulation) and manual therapy.  Train in functional activity modifications, such as body mechanics, posture, ergonomics, energy conservation and activity pacing.  Encourage use of local anesthetic or analgesic therapy as an adjunct for pain control (e.g., lidocaine patch, capsaicin cream, topical nonsteroidal anti-inflammatory drugs).  Prepare patient for use of pharmacologic therapy in a stepped approach that may include acetaminophen, nonsteroidal anti-inflammatory drugs, opioid, antiepileptic, antidepressant or nonbenzodiazepine muscle relaxant.  Review efficacy, tolerability, adherence and manage medication-induced side effects.      The patient verbalized understanding of instructions provided today and declined a print copy of  patient instruction materials.   The Managed Medicaid care management team will reach out to the patient again  over the next 30 days.  The  Patient has been provided with contact information for the Managed Medicaid care management team and has been advised to call with any health related questions or concerns.   [name], [name] [place] Management Coordinator - Managed Medicaid High Risk [phone]   Following is a copy of your plan of care:  Care Plan : Chronic Pain (Adult)  Updates made by [name], RN since 09/06/2021 12:00 AM     Problem: Chronic Pain Management-fibromyalgia   Priority: High  Onset Date: 01/22/2021     Long-Range Goal: Fibromyalgia pain managed-new pain management provider   Start Date: 08/14/2020  Expected End Date: 11/06/2021  Recent Progress: On track  Priority: High  Note:   Current Barriers:  Care Coordination needs related to pain management provider.   Patient is experiencing fibromyalgia flair and is interested in help being connected to a pain management provider. Unable to independently secure referral or appointment for pain management provider. Update 08/06/21:  Patient continues to see Dr. Humphrey Rolls and her neurologist-has appointment this week.  C/O headache-refilled pain medicine. Update 09/06/21:  No complaints today-patient states pain is managed.  Nurse Case Manager Clinical Goal(s):  Over the next 45 days, patient will work with [facility] to address needs related to referral for pain management provider and associated care coordination needs. Update 09/22/20:  patient is currently seeing Dr. Humphrey Rolls at [facility].  Interventions:  Inter-disciplinary care team collaboration (see longitudinal plan of care) Evaluation of current treatment plan related to fibromyalgia  and patient's adherence to plan as established by provider. Update 06/15/21:  Patient taking Baclofen again, Effexor  added. Collaborated with primary care provider regarding recommendations and referral to pain management provider. Discussed plans with patient for ongoing care management follow up and provided patient with direct contact information for care management team Anticipate pain education program, pain management support as part of pain management referral.       Patient Goals/Self-Care Activities Over the next 45 days, patient will:  -Attends all scheduled provider appointments  develop a personal pain management plan with your pain management provider.  Follow Up Plan:  The Managed Medicaid care management team will reach out to the patient again over the next 30 days.    Care Plan : Wellness (Adult)  Updates made by [name], RN since 09/06/2021 12:00 AM     Problem: Medication Adherence (Wellness)   Priority: High  Onset Date: 01/22/2021     Long-Range Goal: Medication Adherence Maintained   Start Date: 09/06/2020  Expected End Date: 11/06/2021  Recent Progress: Not on track  Priority: High  Note:   Current Barriers:  Patient states she needs dental resources and help securing cardiology referral appt.-has not heard anything yet.  Long QT noted on EKG and during sleep study. Patient lost her glucose meter and has now found it-checking blood sugars 1-2 times a day, 165 today-continues to see nutritionist.  Nurse Case Manager Clinical Goal(s):  Over the next 30 days, patient will work with CM team pharmacist to review current medications. Update 01/22/21:  Patient met with Pharmacist and continues to follow. Over the next 30 days, patient will meet with Nutritionist and check her blood sugars. Over the next 30 days, patient will attend all scheduled appointments.  Interventions:  Inter-disciplinary care team collaboration (see longitudinal plan of care) Evaluation of current treatment plan and patient's adherence to plan as established  by provider. Advised patient to contact  her PCP for any medication needs. Reviewed medications with patient. Collaborated with pharmacy regarding medications.  Discussed plans with patient for ongoing care management follow up and provided patient with direct contact information for care management team Pharmacy referral for medication review. Patient given phone number for [facility] transportation if needed. Will notify PCP of need for testing strips and dietician referral. Collaborated with PCP for cardiology appointment. Collaborated with Care Guide for dental resources. Care guide referral for dental resources.  Patient Goals/Self-Care Activities Over the next 30 days, patient will:  -Patient will take medications as prescribed. Calls pharmacy for medication refills Calls provider office for new concerns or questions  Follow Up Plan: The Managed Medicaid care management team will reach out to the patient again over the next 30 days.  The patient has been provided with contact information for the Managed Medicaid care management team and has been advised to call with any health related questions or concerns.

## 2021-09-06 NOTE — Patient Outreach (Signed)
Medicaid Managed Care   Nurse Care Manager Note  09/06/2021 Name:  Alison [name] MRN:  [id number] DOB:  04-23-1976  Alison [name] is an 45 y.o. year old female who is a primary patient of [facility].  The [facility] Managed Care Coordination team was consulted for assistance with:    Chronic healthcare management needs.  Alison [name] was given information about Medicaid Managed Care Coordination team services today. Alison [name] Patient agreed to services and verbal consent obtained.  Engaged with patient by telephone for follow up visit in response to provider referral for case management and/or care coordination services.   Assessments/Interventions:  Review of past medical history, allergies, medications, health status, including review of consultants reports, laboratory and other test data, was performed as part of comprehensive evaluation and provision of chronic care management services.  SDOH (Social Determinants of Health) assessments and interventions performed: SDOH Interventions    Flowsheet Row Most Recent Value  SDOH Interventions   Intimate Partner Violence Interventions Intervention Not Indicated  Physical Activity Interventions --  [patient not physically able to engage in moderate or strenouous exercise]       Care Plan  Allergies  Allergen Reactions   Penicillin G Itching, Rash, Anaphylaxis and Palpitations   Lamictal [Lamotrigine] Rash   Carbamazepine Other (See Comments)    Medication interaction-prograf   Hydrocodone-Acetaminophen Itching   Naproxen Itching    Medications Reviewed Today     Reviewed by [name], RN (Registered Nurse) on 09/06/21 at [place] List Status: <None>   Medication Order Taking? Sig Documenting Provider Last Dose Status Informant  Accu-Chek FastClix Lancets MISC [id number] No Apply topically 2 (two) times daily. [provider] Taking Active   baclofen (LIORESAL) 10 MG tablet [id number] No  Take 10 mg by mouth 2 (two) times daily. [provider] Taking Active   benztropine (COGENTIN) 1 MG tablet [id number] No Take 1 tablet (1 mg total) by mouth daily as needed for tremors. [name], MD Taking Active Self  Dulaglutide (TRULICITY) 9.51 OA/4.1YS SOPN [id number] No Inject 0.75 mg into the skin once a week.  Patient taking differently: Inject 0.75 mg into the skin every Wednesday.   Myles Gip, DO Taking Active   ergocalciferol (VITAMIN D2) 1.25 MG (50000 UT) capsule [id number] No Take 1 capsule by mouth once a week. [provider] Taking Active   eszopiclone (LUNESTA) 1 MG TABS tablet [id number]  Take 1 tablet (1 mg total) by mouth at bedtime as needed for sleep. Take immediately before bedtime [name], [name], MD  Active   ezetimibe (ZETIA) 10 MG tablet [id number] No Take 10 mg by mouth daily. [provider] Taking Active   glucose blood (COOL BLOOD GLUCOSE TEST STRIPS) test strip [id number] No Use as instructed Steele Sizer, MD Taking Active   icosapent Ethyl (VASCEPA) 1 g capsule [id number] No Take by mouth. [provider] Taking Active   lisinopril (ZESTRIL) 20 MG tablet [id number] No Take 1 tablet (20 mg total) by mouth daily. [name], PA-C Taking Active   Magnesium 500 MG CAPS [id number] No Take by mouth. [provider] Taking Active   Melatonin 10 MG TABS [id number] No Take 10 mg by mouth at bedtime as needed (sleep). [provider] Taking Active Self  metFORMIN (GLUCOPHAGE) 1000 MG tablet [id number] No Take 1 tablet (1,000 mg total) by mouth 2 (two) times daily with a meal. Myles Gip, DO Taking Active Self  mirabegron ER (MYRBETRIQ) 50 MG  TB24 tablet [id number] No Take 1 tablet (50 mg total) by mouth daily. Debroah Loop, PA-C Taking Active Self  Multiple Vitamin (MULTI-VITAMIN) tablet [id number] No Take 1 tablet by mouth daily. [provider] Taking Active Self  nitrofurantoin,  macrocrystal-monohydrate, (MACROBID) 100 MG capsule [id number] No Take 1 capsule (100 mg total) by mouth 2 (two) times daily. [name], PA-C Taking Active   oxyCODONE ER [facility] ER) 13.5 MG C12A [id number] No Take 1 tablet by mouth 2 (two) times daily. [provider] Taking Active   pregabalin (LYRICA) 200 MG capsule [id number] No Take 200 mg by mouth 2 (two) times daily. [provider] Taking Active   QUEtiapine (SEROQUEL XR) 300 MG 24 hr tablet [id number]  Take 1 tablet (300 mg total) by mouth at bedtime. [name], MD  Active   QUEtiapine (SEROQUEL XR) 50 MG TB24 24 hr tablet [id number]  Take 1 tablet (50 mg total) by mouth at bedtime. Take along with 300 mg at bedtime - HOLD UNTIL CARDIOLOGY EVALUATION [name], MD  Active   sulfamethoxazole-trimethoprim (BACTRIM DS) 800-160 MG per tablet 1 tablet [id number]   Hollice Espy, MD  Active   tacrolimus (PROGRAF) 1 MG capsule [id number] No Take 1 capsule by mouth once daily  Patient taking differently: Take 1 mg by mouth 1 day or 1 dose.   [name], FNP Taking Active Self           Med Note [name], [name]   Fri Jan 19, 2021  5:01 AM)    venlafaxine XR (EFFEXOR-XR) 37.5 MG 24 hr capsule [id number] No TAKE ONE CAPSULE BY MOUTH DAILY WITH Eldridge Abrahams, MD Taking Active   XYLIMELTS 550 MG DISK [id number] No USE one BY MOUTH EVERY 8 HOURS AS NEEDED FOR DRY MOUTH [provider] Taking Active             Patient Active Problem List   Diagnosis Date Noted   Prolonged Q-T interval on ECG 08/29/2021   Insomnia 07/20/2021   Bipolar disorder, in full remission, most recent episode mixed ([place]) 07/20/2021   Bipolar 1 disorder, mixed, mild ([place]) 05/09/2021   GAD (generalized anxiety disorder) 05/09/2021   Hepatic cirrhosis ([place])    Opioid dependence with opioid-induced disorder ([place]) 12/20/2020   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   Bipolar disorder, in full  remission, most recent episode depressed ([place]) 09/27/2020   Suprapubic pain 07/25/2020   Bipolar 1 disorder, depressed, mild ([place]) 07/10/2020   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   Edema of lower extremity 09/16/2019   Carpal tunnel syndrome, left 07/26/2019   Cubital tunnel syndrome on left 07/26/2019   Bipolar I disorder, most recent episode depressed ([place]) 07/12/2019   PTSD (post-traumatic stress disorder) 79/15/0569   Umbilical hernia without obstruction and without gangrene 06/17/2019   Encounter for surveillance of abnormal nevi 06/17/2019   Pineal gland cyst 06/17/2019   Chronic hip pain, left 06/02/2019   Lumbar spondylosis 06/02/2019   Mouth dryness 06/02/2019   Obesity (BMI 35.0-39.9 without comorbidity) 06/02/2019   Goals of care, counseling/discussion 05/18/2019   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[place]) 05/18/2019   Occipital neuralgia 05/13/2019   Plantar fasciitis of left foot 04/27/2019   Fibromyalgia 03/23/2019   Systemic lupus erythematosus (SLE) in adult [facility]) 03/23/2019   Essential hypertension 03/23/2019   Hepatitis 03/23/2019   Mass of left kidney 03/23/2019   Diabetes mellitus ([place]) 03/23/2019  Nausea without vomiting 03/23/2019   Neuropathy 03/23/2019   Cancer ([place]) 03/23/2019   Hyperlipidemia 03/23/2019   Osteoarthritis 03/23/2019   Trigeminal neuralgia 03/23/2019   Chronic renal disease, stage III ([place]) 03/23/2019   Nephrolithiasis 03/23/2019   Migraine 03/23/2019   OSA on CPAP 03/23/2019   Fibrocystic breast 03/23/2019   Heart murmur 03/23/2019   Bipolar disorder, in partial remission, most recent episode depressed ([place]) 07/02/2017   Abnormal uterine bleeding 03/18/2017   Major depressive disorder, recurrent ([place]) 03/18/2017   Morbid obesity ([place]) 03/18/2017   Secondary hyperparathyroidism ([place]) 02/07/2017   Neuropathic pain  11/19/2016   Post herpetic neuralgia 11/19/2016   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[place]) 01/13/2015   Lumbar disc disease with radiculopathy 07/26/2013   S/P liver transplant ([place]) 07/26/2013   Type 2 diabetes mellitus, uncontrolled 07/26/2013   Facial nerve disorder 06/29/2012   Low back pain 12/26/2010    Conditions to be addressed/monitored per PCP order:   chronic healthcare management needs,  DM2, chronic pain, OSA, migraines, LBP, s/p liver transplant, LDD, DLBCL, anxiety, depression, bipolar, chronic kidney disease, HLD, PTSD  Care Plan : Chronic Pain (Adult)  Updates made by [name], RN since 09/06/2021 12:00 AM     Problem: Chronic Pain Management-fibromyalgia   Priority: High  Onset Date: 01/22/2021     Long-Range Goal: Fibromyalgia pain managed-new pain management provider   Start Date: 08/14/2020  Expected End Date: 11/06/2021  Recent Progress: On track  Priority: High  Note:   Current Barriers:  Care Coordination needs related to pain management provider.   Patient is experiencing fibromyalgia flair and is interested in help being connected to a pain management provider. Unable to independently secure referral or appointment for pain management provider. Update 08/06/21:  Patient continues to see Dr. Humphrey Rolls and her neurologist-has appointment this week.  C/O headache-refilled pain medicine. Update 09/06/21:  No complaints today-patient states pain is managed.  Nurse Case Manager Clinical Goal(s):  Over the next 45 days, patient will work with [facility] to address needs related to referral for pain management provider and associated care coordination needs. Update 09/22/20:  patient is currently seeing Dr. Humphrey Rolls at [facility].  Interventions:  Inter-disciplinary care team collaboration (see longitudinal plan of care) Evaluation of current treatment plan related to fibromyalgia  and patient's adherence to plan as established by provider. Update  06/15/21:  Patient taking Baclofen again, Effexor added. Collaborated with primary care provider regarding recommendations and referral to pain management provider. Discussed plans with patient for ongoing care management follow up and provided patient with direct contact information for care management team Anticipate pain education program, pain management support as part of pain management referral.       Patient Goals/Self-Care Activities Over the next 45 days, patient will:  -Attends all scheduled provider appointments  develop a personal pain management plan with your pain management provider.  Follow Up Plan:  The Managed Medicaid care management team will reach out to the patient again over the next 30 days.   Care Plan : Wellness (Adult)  Updates made by [name], RN since 09/06/2021 12:00 AM     Problem: Medication Adherence (Wellness)   Priority: High  Onset Date: 01/22/2021     Long-Range Goal: Medication Adherence Maintained   Start Date: 09/06/2020  Expected End Date: 11/06/2021  Recent Progress: Not on track  Priority: High  Note:   Current Barriers:  Patient states she needs dental resources and help  securing cardiology referral appt.-has not heard anything yet.  Long QT noted on EKG and during sleep study. Patient lost her glucose meter and has now found it-checking blood sugars 1-2 times a day, 165 today-continues to see nutritionist.  Nurse Case Manager Clinical Goal(s):  Over the next 30 days, patient will work with CM team pharmacist to review current medications. Update 01/22/21:  Patient met with Pharmacist and continues to follow. Over the next 30 days, patient will meet with Nutritionist and check her blood sugars. Over the next 30 days, patient will attend all scheduled appointments.  Interventions:  Inter-disciplinary care team collaboration (see longitudinal plan of care) Evaluation of current treatment plan and patient's adherence to plan as  established by provider. Advised patient to contact her PCP for any medication needs. Reviewed medications with patient. Collaborated with pharmacy regarding medications.  Discussed plans with patient for ongoing care management follow up and provided patient with direct contact information for care management team Pharmacy referral for medication review. Patient given phone number for [facility] transportation if needed. Will notify PCP of need for testing strips and dietician referral. Collaborated with PCP for cardiology appointment. Collaborated with Care Guide for dental resources. Care guide referral for dental resources.  Patient Goals/Self-Care Activities Over the next 30 days, patient will:  -Patient will take medications as prescribed. Calls pharmacy for medication refills Calls provider office for new concerns or questions  Follow Up Plan: The Managed Medicaid care management team will reach out to the patient again over the next 30 days.  The patient has been provided with contact information for the Managed Medicaid care management team and has been advised to call with any health related questions or concerns.     Follow Up:  Patient agrees to Care Plan and Follow-up.  Plan: The Managed Medicaid care management team will reach out to the patient again over the next 30 days. and The  Patient has been provided with contact information for the Managed Medicaid care management team and has been advised to call with any health related questions or concerns.  Date/time of next scheduled RN care management/care coordination outreach:  10/02/21 at 1230.

## 2021-09-07 ENCOUNTER — Encounter: Payer: Self-pay | Admitting: Cardiology

## 2021-09-07 ENCOUNTER — Other Ambulatory Visit: Payer: Self-pay

## 2021-09-07 ENCOUNTER — Ambulatory Visit: Payer: Medicaid Other | Admitting: Cardiology

## 2021-09-07 VITALS — BP 120/84 | HR 68 | Ht 66.0 in | Wt 246.0 lb

## 2021-09-07 DIAGNOSIS — Z6839 Body mass index (BMI) 39.0-39.9, adult: Secondary | ICD-10-CM

## 2021-09-07 DIAGNOSIS — R9431 Abnormal electrocardiogram [ECG] [EKG]: Secondary | ICD-10-CM | POA: Diagnosis not present

## 2021-09-07 DIAGNOSIS — E782 Mixed hyperlipidemia: Secondary | ICD-10-CM

## 2021-09-07 DIAGNOSIS — R072 Precordial pain: Secondary | ICD-10-CM

## 2021-09-07 DIAGNOSIS — I10 Essential (primary) hypertension: Secondary | ICD-10-CM

## 2021-09-07 MED ORDER — ICOSAPENT ETHYL 1 G PO CAPS: 2.0000 g | ORAL_CAPSULE | Freq: Two times a day (BID) | ORAL | 5 refills | Status: DC

## 2021-09-07 NOTE — Patient Instructions (Addendum)
Medication Instructions:   Your physician has recommended you make the following change in your medication:    INCREASE your Vascepa to 2 Grams twice a day.   *If you need a refill on your cardiac medications before your next appointment, please call your pharmacy*   Lab Work:  Your physician recommends that you return for a FASTING lipid profile: The week prior to your 3 month follow up.  - You will need to be fasting. Please do not have anything to eat or drink after midnight the morning you have the lab work. You may only have water or black coffee with no cream or sugar.   Please return to our office on __________________at____________am/pm    Testing/Procedures:   Your physician has requested that you have an echocardiogram. Echocardiography is a painless test that uses sound waves to create images of your heart. It provides your doctor with information about the size and shape of your heart and how well your heart's chambers and valves are working. This procedure takes approximately one hour. There are no restrictions for this procedure.   2.   [place]     Your caregiver has ordered a Stress Test with nuclear imaging. The purpose of this test is to evaluate the blood supply to your heart muscle. This procedure is referred to as a "Non-Invasive Stress Test." This is because other than having an IV started in your vein, nothing is inserted or "invades" your body. Cardiac stress tests are done to find areas of poor blood flow to the heart by determining the extent of coronary artery disease (CAD). Some patients exercise on a treadmill, which naturally increases the blood flow to your heart, while others who are  unable to walk on a treadmill due to physical limitations have a pharmacologic/chemical stress agent called Lexiscan . This medicine will mimic walking on a treadmill by temporarily increasing your coronary blood flow.      PLEASE REPORT TO [facility] MEDICAL MALL ENTRANCE    THE VOLUNTEERS AT THE FIRST DESK WILL DIRECT YOU WHERE TO GO     *Please note: these test may take anywhere between 2-4 hours to complete       Date of Procedure:_____________________________________   Arrival Time for Procedure:______________________________    PLEASE NOTIFY THE OFFICE AT LEAST 24 HOURS IN ADVANCE IF YOU ARE UNABLE TO KEEP YOUR APPOINTMENT.  [place] 24 HOURS IN ADVANCE IF YOU ARE UNABLE TO KEEP YOUR APPOINTMENT. [phone]       How to prepare for your Myoview test:         _XX___:  Hold diabetes medication the morning of procedure: metFORMIN (GLUCOPHAGE)    1. Do not eat or drink after midnight  2. No caffeine for 24 hours prior to test  3. No smoking 24 hours prior to test.  4. Unless instructed otherwise, Take your medication with a small sips of water.    5.         Ladies, please do not wear dresses. Skirts or pants are appropriate. Please wear a short sleeve shirt.  6. No perfume, cologne or lotion.  7. Wear comfortable walking shoes. No heels!    Follow-Up: At [facility], you and your health needs are our priority.  As part of our continuing mission to provide you with exceptional heart care, we have created designated Provider Care Teams.  These Care Teams include your primary Cardiologist (physician) and Advanced Practice  Providers (APPs -  Physician Assistants and Nurse Practitioners) who all work together to provide you with the care you need, when you need it.  We recommend signing up for the patient portal called "MyChart".  Sign up information is provided on this After Visit Summary.  MyChart is used to connect with patients for Virtual Visits (Telemedicine).  Patients are able to view lab/test results, encounter notes, upcoming appointments, etc.  Non-urgent messages can be sent to your provider as well.   To learn more about what you can do with MyChart, go to [email or web address].     Your next appointment:   3 month(s)  The format for your next appointment:   In Person  Provider:   You may see [name], MD or one of the following Advanced Practice Providers on your designated Care Team:   Murray Hodgkins, NP Christell Faith, PA-C  Other Instructions

## 2021-09-07 NOTE — Progress Notes (Signed)
Cardiology Office Note:    Date:  09/07/2021   ID:  Alison [name], DOB 1975-12-08, MRN [id number]  PCP:  [facility] Cardiologist:  [name], MD     Referring MD: [place]*   Chief Complaint  Patient presents with   New Patient (Initial Visit)    Referred by PCP for Prolonged Q -T. Meds reviewed verbally with Paitent.     History of Present Illness:    Alison [name] is a 45 y.o. female with a hx of hypertension, diabetes, morbid obesity, liver transplant 1993 who presents due to QT prolongation.  Patient was seen by her PCP recently, EKG obtained showing prolonged QT.  States having occasional nonspecific chest pain not associated with exertion.  States having reflux and costochondritis, chest discomfort is different from reflux pain.  She takes Zetia and Vascepa 1 g twice daily.  Not on statins due to prior liver transplant.  Denies any history of heart attack, denies dizziness, syncope.  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)     Past Surgical History:  Procedure Laterality  Date   BONE MARROW BIOPSY  01/13/2015   BREAST BIOPSY  12/2014   BREAST BIOPSY  2011   BREAST SURGERY     CHOLECYSTECTOMY     COLONOSCOPY     ESOPHAGOGASTRODUODENOSCOPY     ESOPHAGOGASTRODUODENOSCOPY (EGD) WITH PROPOFOL N/A 01/09/2021   Procedure: ESOPHAGOGASTRODUODENOSCOPY (EGD) WITH PROPOFOL;  Surgeon: [name], MD;  Location: [facility] ENDOSCOPY;  Service: Endoscopy;  Laterality: N/A;   HERNIA REPAIR     infusaport     LIVER TRANSPLANT  12/17/1991   LUMBAR PUNCTURE     PORT A CATH INJECTION ([place] HX)     tumor removal  2015    Current Medications: Current Meds  Medication Sig   Accu-Chek FastClix Lancets MISC Apply topically 2 (two) times daily.   baclofen (LIORESAL) 10 MG tablet Take 10 mg by mouth 2 (two) times daily.   benztropine (COGENTIN) 1 MG tablet Take 1 tablet (1 mg total) by mouth daily as needed for tremors.   Dulaglutide (TRULICITY) 3 KW/4.0XB SOPN Inject 3 mg into the skin once a week.   ergocalciferol (VITAMIN D2) 1.25 MG (50000 UT) capsule Take 1 capsule by mouth once a week.   eszopiclone (LUNESTA) 1 MG TABS tablet Take 1 tablet (1 mg total) by mouth at bedtime as needed for sleep.  Take immediately before bedtime   ezetimibe (ZETIA) 10 MG tablet Take 10 mg by mouth daily.   glucose blood (COOL BLOOD GLUCOSE TEST STRIPS) test strip Use as instructed   lisinopril (ZESTRIL) 20 MG tablet Take 1 tablet (20 mg total) by mouth daily.   Magnesium 500 MG CAPS Take by mouth.   Melatonin 10 MG TABS Take 10 mg by mouth at bedtime as needed (sleep).   metFORMIN (GLUCOPHAGE) 1000 MG tablet Take 1 tablet (1,000 mg total) by mouth 2 (two) times daily with a meal.   mirabegron ER (MYRBETRIQ) 50 MG TB24 tablet Take 1 tablet (50 mg total) by mouth daily.   Multiple Vitamin (MULTI-VITAMIN) tablet Take 1 tablet by mouth daily.   nitrofurantoin, macrocrystal-monohydrate, (MACROBID) 100 MG capsule Take 1 capsule (100 mg total) by mouth 2 (two) times daily.   oxyCODONE ER (XTAMPZA ER)  13.5 MG C12A Take 1 tablet by mouth 2 (two) times daily.   pregabalin (LYRICA) 200 MG capsule Take 200 mg by mouth 2 (two) times daily.   tacrolimus (PROGRAF) 1 MG capsule Take 1 capsule by mouth once daily (Patient taking differently: Take 1 mg by mouth 1 day or 1 dose.)   venlafaxine XR (EFFEXOR-XR) 37.5 MG 24 hr capsule TAKE ONE CAPSULE BY MOUTH DAILY WITH BREAKFAST   XYLIMELTS 550 MG DISK USE one BY MOUTH EVERY 8 HOURS AS NEEDED FOR DRY MOUTH   [DISCONTINUED] icosapent Ethyl (VASCEPA) 1 g capsule Take by mouth.   Current Facility-Administered Medications for the 09/07/21 encounter (Office Visit) with [name], MD  Medication   sulfamethoxazole-trimethoprim (BACTRIM DS) 800-160 MG per tablet 1 tablet     Allergies:   Penicillin g, Lamictal [lamotrigine], Carbamazepine, Hydrocodone-acetaminophen, and Naproxen   Social History   Socioeconomic History   Marital status: Single    Spouse name: Not on file   Number of children: 0   Years of education: 9   Highest education level: GED or equivalent  Occupational History   Occupation: disabled  Tobacco Use   Smoking status: Never   Smokeless tobacco: Never  Vaping Use   Vaping Use: Never used  Substance and Sexual Activity   Alcohol use: Not Currently   Drug use: Not Currently    Types: Marijuana    Comment: pain managment last used in early april   Sexual activity: Not Currently  Other Topics Concern   Not on file  Social History Narrative   Not on file   Social Determinants of Health   Financial Resource Strain: Low Risk    Difficulty of Paying Living Expenses: Not hard at all  Food Insecurity: No Food Insecurity   Worried About Charity fundraiser in the Last Year: Never true   [place] in the Last Year: Never true  Transportation Needs: No Transportation Needs   Lack of Transportation (Medical): No   Lack of Transportation (Non-Medical): No  Physical Activity: Insufficiently Active   Days of Exercise  per Week: 1 day   Minutes of Exercise per Session: 30 min  Stress: Stress Concern Present   Feeling of Stress : Rather much  Social Connections: Moderately Integrated   Frequency of Communication with Friends and Family: More than three times a week   Frequency of Social Gatherings with Friends and Family: More than three times a week   Attends Religious Services: More than 4 times per year   Active Member of [employer] or Organizations: No   Attends Archivist  Meetings: Never   Marital Status: Living with partner     Family History: The patient's family history includes Bipolar disorder in her mother; Cancer in her maternal aunt, maternal grandfather, and maternal uncle; Cancer - Other in her mother; Diabetes in her father; Heart disease in her father and mother; Hypertension in her brother, father, and mother; Lupus in her paternal grandmother; Parkinson's disease in her maternal grandmother.  ROS:   Please see the history of present illness.     All other systems reviewed and are negative.  EKGs/Labs/Other Studies Reviewed:    The following studies were reviewed today:   EKG:  EKG is  ordered today.  The ekg ordered today demonstrates sinus rhythm, right bundle branch block, QTC 476.  Recent Labs: 07/31/2021: ALT 37; BUN 20; Creatinine, Ser 0.93; Hemoglobin 13.3; Platelets 113; Potassium 4.6; Sodium 134  Recent Lipid Panel    Component Value Date/Time   CHOL 219 (H) 12/11/2020 0000   TRIG 333 (H) 12/11/2020 0000   HDL 38 (L) 12/11/2020 0000   CHOLHDL 5.8 (H) 12/11/2020 0000   LDLCALC 133 (H) 12/11/2020 0000     Risk Assessment/Calculations:          Physical Exam:    VS:  BP 120/84 (BP Location: Left Arm, Patient Position: Sitting, Cuff Size: Large)   Pulse 68   Ht _0  (1.676 m)   Wt 246 lb (111.6 kg)   LMP 02/04/2018 (Approximate) Comment: Per patient, over a year ago  SpO2 96%   BMI 39.71 kg/m     Wt Readings from Last 3 Encounters:  09/07/21 246  lb (111.6 kg)  08/29/21 252 lb 9.6 oz (114.6 kg)  07/31/21 255 lb 9.6 oz (115.9 kg)     GEN:  Well nourished, well developed in no acute distress HEENT: Normal NECK: No JVD; No carotid bruits LYMPHATICS: No lymphadenopathy CARDIAC: RRR, no murmurs, rubs, gallops RESPIRATORY:  Clear to auscultation without rales, wheezing or rhonchi  ABDOMEN: Soft, non-tender, non-distended MUSCULOSKELETAL:  No edema; No deformity  SKIN: Warm and dry NEUROLOGIC:  Alert and oriented x 3 PSYCHIATRIC:  Normal affect   ASSESSMENT:    1. QT prolongation   2. Precordial pain   3. Primary hypertension   4. Mixed hyperlipidemia   5. BMI 39.0-39.9,adult    PLAN:    In order of problems listed above:  QTC prolongation on EKG. patient has a right bundle branch block, corrected QTC considering right bundle branch block was 392 which is normal.  Patient made aware and reassured.  Okay to continue psychiatric medications as per PCP or behavioral health.   Chest pain, risk factors hypertension, hyperlipidemia, obesity, diabetes.  Get echocardiogram, get Lexiscan Myoview to evaluate ischemia. Hypertension, BP controlled.  Continue lisinopril as prescribed. Hyperlipidemia, cholesterol elevated.  Not on statins due to history of liver transplant.  Increase Vascepa to 2 g twice daily, continue Zetia.  Repeat fasting lipid profile in 3 months. Obesity, weight loss recommended.  Follow-up after echo and Myoview.     Shared Decision Making/Informed Consent The risks [chest pain, shortness of breath, cardiac arrhythmias, dizziness, blood pressure fluctuations, myocardial infarction, stroke/transient ischemic attack, nausea, vomiting, allergic reaction, radiation exposure, metallic taste sensation and life-threatening complications (estimated to be 1 in 10,000)], benefits (risk stratification, diagnosing coronary artery disease, treatment guidance) and alternatives of a nuclear stress test were discussed in detail  with Ms. Flenniken and she agrees to proceed.    Medication Adjustments/Labs and Tests Ordered: Current medicines  are reviewed at length with the patient today.  Concerns regarding medicines are outlined above.  Orders Placed This Encounter  Procedures   NM Myocar Multi W/Spect W/Wall Motion / EF   Lipid panel   Lipid panel   EKG 12-Lead   ECHOCARDIOGRAM COMPLETE    Meds ordered this encounter  Medications   DISCONTD: icosapent Ethyl (VASCEPA) 1 g capsule    Sig: Take 2 capsules (2 g total) by mouth 2 (two) times daily.    Dispense:  120 capsule    Refill:  5   icosapent Ethyl (VASCEPA) 1 g capsule    Sig: Take 2 capsules (2 g total) by mouth 2 (two) times daily.    Dispense:  120 capsule    Refill:  5     Patient Instructions  Medication Instructions:   Your physician has recommended you make the following change in your medication:    INCREASE your Vascepa to 2 Grams twice a day.   *If you need a refill on your cardiac medications before your next appointment, please call your pharmacy*   Lab Work:  Your physician recommends that you return for a FASTING lipid profile: The week prior to your 3 month follow up.  - You will need to be fasting. Please do not have anything to eat or drink after midnight the morning you have the lab work. You may only have water or black coffee with no cream or sugar.   Please return to our office on __________________at____________am/pm    Testing/Procedures:   Your physician has requested that you have an echocardiogram. Echocardiography is a painless test that uses sound waves to create images of your heart. It provides your doctor with information about the size and shape of your heart and how well your heart's chambers and valves are working. This procedure takes approximately one hour. There are no restrictions for this procedure.   2.   [place]     Your caregiver has ordered a Stress Test with nuclear imaging. The  purpose of this test is to evaluate the blood supply to your heart muscle. This procedure is referred to as a "Non-Invasive Stress Test." This is because other than having an IV started in your vein, nothing is inserted or "invades" your body. Cardiac stress tests are done to find areas of poor blood flow to the heart by determining the extent of coronary artery disease (CAD). Some patients exercise on a treadmill, which naturally increases the blood flow to your heart, while others who are  unable to walk on a treadmill due to physical limitations have a pharmacologic/chemical stress agent called Lexiscan . This medicine will mimic walking on a treadmill by temporarily increasing your coronary blood flow.      PLEASE REPORT TO [facility] MEDICAL MALL ENTRANCE   THE VOLUNTEERS AT THE FIRST DESK WILL DIRECT YOU WHERE TO GO     *Please note: these test may take anywhere between 2-4 hours to complete       Date of Procedure:_____________________________________   Arrival Time for Procedure:______________________________    PLEASE NOTIFY THE OFFICE AT LEAST 24 HOURS IN ADVANCE IF YOU ARE UNABLE TO KEEP YOUR APPOINTMENT.  [place] 24 HOURS IN ADVANCE IF YOU ARE UNABLE TO KEEP YOUR APPOINTMENT. [phone]       How to prepare for your Myoview test:         _XX___:  Hold diabetes medication the morning of  procedure: metFORMIN (GLUCOPHAGE)    1. Do not eat or drink after midnight  2. No caffeine for 24 hours prior to test  3. No smoking 24 hours prior to test.  4. Unless instructed otherwise, Take your medication with a small sips of water.    5.         Ladies, please do not wear dresses. Skirts or pants are appropriate. Please wear a short sleeve shirt.  6. No perfume, cologne or lotion.  7. Wear comfortable walking shoes. No heels!    Follow-Up: At [facility], you and your health needs are our priority.  As part of our continuing  mission to provide you with exceptional heart care, we have created designated Provider Care Teams.  These Care Teams include your primary Cardiologist (physician) and Advanced Practice Providers (APPs -  Physician Assistants and Nurse Practitioners) who all work together to provide you with the care you need, when you need it.  We recommend signing up for the patient portal called "MyChart".  Sign up information is provided on this After Visit Summary.  MyChart is used to connect with patients for Virtual Visits (Telemedicine).  Patients are able to view lab/test results, encounter notes, upcoming appointments, etc.  Non-urgent messages can be sent to your provider as well.   To learn more about what you can do with MyChart, go to [email or web address].    Your next appointment:   3 month(s)  The format for your next appointment:   In Person  Provider:   You may see [name], MD or one of the following Advanced Practice Providers on your designated Care Team:   Murray Hodgkins, NP Christell Faith, PA-C  Other Instructions    Signed, [name], MD  09/07/2021 12:39 PM    [place]

## 2021-09-08 LAB — LIPID PANEL
Chol/HDL Ratio: 5.1 ratio — ABNORMAL HIGH (ref 0.0–4.4)
Cholesterol, Total: 198 mg/dL (ref 100–199)
HDL: 39 mg/dL — ABNORMAL LOW (ref >39)
LDL Chol Calc (NIH): 119 mg/dL — ABNORMAL HIGH (ref 0–99)
Triglycerides: 229 mg/dL — ABNORMAL HIGH (ref 0–149)
VLDL Cholesterol Cal: 40 mg/dL (ref 5–40)

## 2021-09-10 DIAGNOSIS — K7581 Nonalcoholic steatohepatitis (NASH): Secondary | ICD-10-CM | POA: Diagnosis not present

## 2021-09-10 DIAGNOSIS — I10 Essential (primary) hypertension: Secondary | ICD-10-CM | POA: Diagnosis not present

## 2021-09-10 DIAGNOSIS — C8580 Other specified types of non-Hodgkin lymphoma, unspecified site: Secondary | ICD-10-CM | POA: Diagnosis not present

## 2021-09-10 DIAGNOSIS — F411 Generalized anxiety disorder: Secondary | ICD-10-CM | POA: Diagnosis not present

## 2021-09-10 DIAGNOSIS — R52 Pain, unspecified: Secondary | ICD-10-CM | POA: Diagnosis not present

## 2021-09-10 DIAGNOSIS — E1165 Type 2 diabetes mellitus with hyperglycemia: Secondary | ICD-10-CM | POA: Diagnosis not present

## 2021-09-10 DIAGNOSIS — Z6839 Body mass index (BMI) 39.0-39.9, adult: Secondary | ICD-10-CM | POA: Diagnosis not present

## 2021-09-10 DIAGNOSIS — F3131 Bipolar disorder, current episode depressed, mild: Secondary | ICD-10-CM | POA: Diagnosis not present

## 2021-09-10 DIAGNOSIS — E669 Obesity, unspecified: Secondary | ICD-10-CM | POA: Diagnosis not present

## 2021-09-10 DIAGNOSIS — Z944 Liver transplant status: Secondary | ICD-10-CM | POA: Diagnosis not present

## 2021-09-10 DIAGNOSIS — M797 Fibromyalgia: Secondary | ICD-10-CM | POA: Diagnosis not present

## 2021-09-10 DIAGNOSIS — E7849 Other hyperlipidemia: Secondary | ICD-10-CM | POA: Diagnosis not present

## 2021-09-12 ENCOUNTER — Other Ambulatory Visit: Payer: Self-pay

## 2021-09-12 ENCOUNTER — Ambulatory Visit (INDEPENDENT_AMBULATORY_CARE_PROVIDER_SITE_OTHER): Payer: Medicaid Other | Admitting: Psychiatry

## 2021-09-12 ENCOUNTER — Encounter: Payer: Self-pay | Admitting: Psychiatry

## 2021-09-12 VITALS — BP 134/86 | HR 85 | Wt 244.0 lb

## 2021-09-12 DIAGNOSIS — F411 Generalized anxiety disorder: Secondary | ICD-10-CM | POA: Diagnosis not present

## 2021-09-12 DIAGNOSIS — G4700 Insomnia, unspecified: Secondary | ICD-10-CM | POA: Diagnosis not present

## 2021-09-12 DIAGNOSIS — F4310 Post-traumatic stress disorder, unspecified: Secondary | ICD-10-CM | POA: Diagnosis not present

## 2021-09-12 DIAGNOSIS — F3178 Bipolar disorder, in full remission, most recent episode mixed: Secondary | ICD-10-CM

## 2021-09-12 DIAGNOSIS — Z9189 Other specified personal risk factors, not elsewhere classified: Secondary | ICD-10-CM | POA: Insufficient documentation

## 2021-09-12 MED ORDER — ESZOPICLONE 1 MG PO TABS: 1.0000 mg | ORAL_TABLET | Freq: Every evening | ORAL | 1 refills | Status: DC | PRN

## 2021-09-12 MED ORDER — ALPRAZOLAM 0.25 MG PO TABS
0.2500 mg | ORAL_TABLET | ORAL | 1 refills | Status: DC
Start: 2021-09-12 — End: 2021-10-19

## 2021-09-12 MED ORDER — LURASIDONE HCL 20 MG PO TABS: 20.0000 mg | ORAL_TABLET | Freq: Every day | ORAL | 0 refills | Status: DC

## 2021-09-12 NOTE — Progress Notes (Signed)
[place] MD OP Progress Note  09/12/2021 4:42 PM Alison [name]  MRN:  [id number]  Chief Complaint:  Chief Complaint   Follow-up; Anxiety; Depression    HPI: Alison [name] is a 45 year old Caucasian female, lives in [place], has a history of bipolar disorder, PTSD, multiple medical problems including fibromyalgia, chronic pain, trigeminal neuralgia, history of liver transplant, B-cell lymphoma, SLE, diabetes melitis, OSA, migraine headache was evaluated in office today.  Patient recently had prolonged QT interval on ECG, had cardiology consultation.  Reviewed notes per Dr.Agbor Charlestine Night -dated 09/07/2021-patient has a right bundle branch block, corrected QTC considering right bundle branch block was 392 which is normal.  Patient made aware and reassured.  Okay to continue psychiatric medications at this time.'  Patient reports since being off the Seroquel and being on the Lunesta she has been sleeping much better.  She also does not feel groggy the next day.  She would like to stay on the Lunesta.  Patient continues to have anxiety attacks, feels nervous, fidgety on and off.  She is no longer taking hydroxyzine since she had prolonged QT syndrome and was advised to hold it.  Patient with history of bipolar disorder is fairly doing well on the current regimen of Effexor.  She currently is not on a mood stabilizer and since she is no longer needing the Seroquel at night discussed starting Latuda.  Patient is agreeable.  Patient denies any suicidality, homicidality or perceptual disturbances.  Patient denies any other concerns today.    Visit Diagnosis:    ICD-10-CM   1. Bipolar disorder, in full remission, most recent episode mixed (HCC)  F31.78 lurasidone (LATUDA) 20 MG TABS tablet    EKG 12-Lead    2. PTSD (post-traumatic stress disorder)  F43.10 ALPRAZolam (XANAX) 0.25 MG tablet    3. GAD (generalized anxiety disorder)  F41.1 ALPRAZolam (XANAX) 0.25 MG tablet    4. Insomnia,  unspecified type  G47.00 eszopiclone (LUNESTA) 1 MG TABS tablet    5. At risk for prolonged QT interval syndrome  Z91.89 EKG 12-Lead      Past Psychiatric History: I have reviewed past psychiatric history from progress note on 06/10/2019.  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   Chemo tx's   Migraine    Morbid obesity ([place])    Neuromuscular disorder ([place])    neuropathy   Neuropathy    Personality disorder ([place])    Post herpetic neuralgia    PTSD (post-traumatic stress disorder)    Renal disorder    S/P liver transplant [facility])     Past Surgical History:  Procedure Laterality Date   BONE MARROW BIOPSY  01/13/2015   BREAST BIOPSY  12/2014   BREAST BIOPSY  2011   BREAST SURGERY  CHOLECYSTECTOMY     COLONOSCOPY     ESOPHAGOGASTRODUODENOSCOPY     ESOPHAGOGASTRODUODENOSCOPY (EGD) WITH PROPOFOL N/A 01/09/2021   Procedure: ESOPHAGOGASTRODUODENOSCOPY (EGD) WITH PROPOFOL;  Surgeon: [name], MD;  Location: [facility] ENDOSCOPY;  Service: Endoscopy;  Laterality: N/A;   HERNIA REPAIR     infusaport     LIVER TRANSPLANT  12/17/1991   LUMBAR PUNCTURE     PORT A CATH INJECTION ([place] HX)     tumor removal  2015    Family Psychiatric History: Reviewed family psychiatric history from progress note on 06/10/2019.  Family History:  Family History  Problem Relation Age of Onset   Heart disease  Mother    Hypertension Mother    Cancer - Other Mother    Bipolar disorder Mother    Heart disease Father    Hypertension Father    Diabetes Father    Parkinson's disease Maternal Grandmother    Cancer Maternal Aunt    Cancer Maternal Uncle    Cancer Maternal Grandfather    Lupus Paternal Grandmother    Hypertension Brother     Social History: Reviewed social history from progress note on 06/10/2019. Social History   Socioeconomic History   Marital status: Single    Spouse name: Not on file   Number of children: 0   Years of education: 9   Highest education level: GED or equivalent  Occupational History   Occupation: disabled  Tobacco Use   Smoking status: Never   Smokeless tobacco: Never  Vaping Use   Vaping Use: Never used  Substance and Sexual Activity   Alcohol use: Not Currently   Drug use: Not Currently    Types: Marijuana    Comment: pain managment last used in early april   Sexual activity: Not Currently  Other Topics Concern   Not on file  Social History Narrative   Not on file   Social Determinants of Health   Financial Resource Strain: Low Risk    Difficulty of Paying Living Expenses: Not hard at all  Food Insecurity: No Food Insecurity   Worried About Charity fundraiser in the Last Year: Never true   [place] in the Last Year: Never true  Transportation Needs: No Transportation Needs   Lack of Transportation (Medical): No   Lack of Transportation (Non-Medical): No  Physical Activity: Insufficiently Active   Days of Exercise per Week: 1 day   Minutes of Exercise per Session: 30 min  Stress: Stress Concern Present   Feeling of Stress : Rather much  Social Connections: Moderately Integrated   Frequency of Communication with Friends and Family: More than three times a week   Frequency of Social Gatherings with Friends and Family: More than three times a week   Attends Religious Services: More than 4 times per year   Active Member of [employer] or  Organizations: No   Attends Archivist Meetings: Never   Marital Status: Living with partner    Allergies:  Allergies  Allergen Reactions   Penicillin G Itching, Rash, Anaphylaxis and Palpitations   Lamictal [Lamotrigine] Rash   Carbamazepine Other (See Comments)    Medication interaction-prograf   Hydrocodone-Acetaminophen Itching   Naproxen Itching    Metabolic Disorder Labs: Lab Results  Component Value Date   HGBA1C 8.9 (A) 12/11/2020   MPG 209 06/05/2020   Lab Results  Component Value Date   PROLACTIN 13.4 07/15/2019   PROLACTIN 21.3 05/24/2019   Lab Results  Component Value Date  CHOL 198 09/07/2021   TRIG 229 (H) 09/07/2021   HDL 39 (L) 09/07/2021   CHOLHDL 5.1 (H) 09/07/2021   LDLCALC 119 (H) 09/07/2021   LDLCALC 133 (H) 12/11/2020   Lab Results  Component Value Date   TSH 2.650 07/15/2019   TSH 4.650 (H) 05/24/2019    Therapeutic Level Labs: No results found for: LITHIUM No results found for: VALPROATE No components found for:  CBMZ  Current Medications: Current Outpatient Medications  Medication Sig Dispense Refill   Accu-Chek FastClix Lancets MISC Apply topically 2 (two) times daily.     ALPRAZolam (XANAX) 0.25 MG tablet Take 1 tablet (0.25 mg total) by mouth as directed. Take one tablet 2-3 times a week only for severe panic attacks 15 tablet 1   baclofen (LIORESAL) 10 MG tablet Take 10 mg by mouth 2 (two) times daily.     benztropine (COGENTIN) 1 MG tablet Take 1 tablet (1 mg total) by mouth daily as needed for tremors. 30 tablet 1   Dulaglutide (TRULICITY) 3 TO/6.7TI SOPN Inject 3 mg into the skin once a week.     ergocalciferol (VITAMIN D2) 1.25 MG (50000 UT) capsule Take 1 capsule by mouth once a week.     ezetimibe (ZETIA) 10 MG tablet Take 10 mg by mouth daily.     glucose blood (COOL BLOOD GLUCOSE TEST STRIPS) test strip Use as instructed 100 each 12   icosapent Ethyl (VASCEPA) 1 g capsule Take 2 capsules (2 g total) by mouth 2  (two) times daily. 120 capsule 5   lisinopril (ZESTRIL) 20 MG tablet Take 1 tablet (20 mg total) by mouth daily. 90 tablet 3   lurasidone (LATUDA) 20 MG TABS tablet Take 1 tablet (20 mg total) by mouth daily with supper. 90 tablet 0   Magnesium 500 MG CAPS Take by mouth.     Melatonin 10 MG TABS Take 10 mg by mouth at bedtime as needed (sleep).     metFORMIN (GLUCOPHAGE) 1000 MG tablet Take 1 tablet (1,000 mg total) by mouth 2 (two) times daily with a meal. 180 tablet 3   mirabegron ER (MYRBETRIQ) 50 MG TB24 tablet Take 1 tablet (50 mg total) by mouth daily. 90 tablet 3   Multiple Vitamin (MULTI-VITAMIN) tablet Take 1 tablet by mouth daily.     nitrofurantoin, macrocrystal-monohydrate, (MACROBID) 100 MG capsule Take 1 capsule (100 mg total) by mouth 2 (two) times daily. 10 capsule 0   oxyCODONE ER (XTAMPZA ER) 13.5 MG C12A Take 1 tablet by mouth 2 (two) times daily.     pregabalin (LYRICA) 200 MG capsule Take 200 mg by mouth 2 (two) times daily.     tacrolimus (PROGRAF) 1 MG capsule Take 1 capsule by mouth once daily (Patient taking differently: Take 1 mg by mouth 1 day or 1 dose.) 30 capsule 0   venlafaxine XR (EFFEXOR-XR) 37.5 MG 24 hr capsule TAKE ONE CAPSULE BY MOUTH DAILY WITH BREAKFAST 90 capsule 0   XYLIMELTS 550 MG DISK USE one BY MOUTH EVERY 8 HOURS AS NEEDED FOR DRY MOUTH     eszopiclone (LUNESTA) 1 MG TABS tablet Take 1 tablet (1 mg total) by mouth at bedtime as needed for sleep. Take immediately before bedtime 30 tablet 1   Current Facility-Administered Medications  Medication Dose Route Frequency Provider Last Rate Last Admin   sulfamethoxazole-trimethoprim (BACTRIM DS) 800-160 MG per tablet 1 tablet  1 tablet Oral Q12H Hollice Espy, MD   1 tablet at 01/17/21 1432  Musculoskeletal: Strength & Muscle Tone: within normal limits Gait & Station: normal Patient leans: N/A  Psychiatric Specialty Exam: Review of Systems  Psychiatric/Behavioral:  The patient is  nervous/anxious.   All other systems reviewed and are negative.  Blood pressure 134/86, pulse 85, weight 244 lb (110.7 kg), last menstrual period 02/04/2018.Body mass index is 39.38 kg/m.  General Appearance: Casual  Eye Contact:  Fair  Speech:  Clear and Coherent  Volume:  Normal  Mood:  Anxious  Affect:  Congruent  Thought Process:  Goal Directed and Descriptions of Associations: Intact  Orientation:  Full (Time, Place, and Person)  Thought Content: Logical   Suicidal Thoughts:  No  Homicidal Thoughts:  No  Memory:  Immediate;   Fair Recent;   Fair Remote;   Fair  Judgement:  Fair  Insight:  Fair  Psychomotor Activity:  Normal  Concentration:  Concentration: Fair and Attention Span: Fair  Recall:  [employer] of Knowledge: Fair  Language: Fair  Akathisia:  No  Handed:  Right  AIMS (if indicated): done, 0  Assets:  Communication Skills Desire for Improvement Housing Social Support  ADL's:  Intact  Cognition: WNL  Sleep:  Fair   Screenings: AIMS    Flowsheet Row Video Visit from 05/09/2021 in [place] Total Score 0      [place] Office Visit from 08/29/2021 in [facility] Video Visit from 05/28/2021 in [place] Video Visit from 05/09/2021 in [place] Visit from 12/04/2020 in [facility]  Total GAD-7 Score 15 9 9 5       PHQ2-9    [place] Visit from 09/12/2021 in [place] from 09/04/2021 in [facility] from 08/29/2021 in [facility] Office Visit from 07/20/2021 in [place] [facility] from 03/28/2021 in [place]  PHQ-2 Total Score 2 2 3 3 3   PHQ-9 Total Score 8 13 19 15 18       [place] Visit from 09/12/2021 in [place] Office Visit from 09/04/2021 in [place] Office Visit from 07/20/2021 in [place] No Risk No Risk No Risk        Assessment and Plan: Alison [name] is a 45 year old Caucasian female on disability, has a history of bipolar disorder, PTSD, history of borderline personality disorder, chronic pain, fibromyalgia, SLE, diabetes, OSA, migraine headaches, recent right sided swelling of face was evaluated in office today.  Patient with recent QT prolongation on EKG, was cardiologically cleared, will benefit from the following plan.  Plan Bipolar disorder mixed in remission Discontinue Seroquel. Start Latuda to replace the Seroquel at low dosage of 20 mg p.o. daily with supper Continue Cogentin 1 mg p.o. daily as needed for side effects of Latuda Venlafaxine extended release 37.5 mg p.o. daily.   GAD-unstable Venlafaxine extended release 37.5 mg p.o. daily Start Xanax 0.25 mg as needed for severe anxiety attacks Continue CBT  PTSD-stable Continue venlafaxine as prescribed  Insomnia-improving Lunesta 1 mg p.o. nightly Sleep study completed-pending report.  At risk for prolonged QT syndrome-I have reviewed cardiologist notes as noted above dated 09/07/2021. Will repeat EKG-call [phone] repeat EKG since she is currently on Latuda.  Follow-up in clinic in 3 weeks or sooner if needed.  This note was generated in part or whole  with voice recognition software. Voice recognition is usually quite accurate but there are transcription errors that can and very often do occur. I apologize for any typographical errors that were not detected and corrected.       [name], MD 09/13/2021, 9:17 AM

## 2021-09-12 NOTE — Patient Instructions (Signed)
Lurasidone Oral Tablets What is this medication? LURASIDONE (loo RAS i done) is an antipsychotic. It is used to treat schizophrenia or bipolar disorder, also known as manic-depression. This medicine may be used for other purposes; ask your health care provider or pharmacist if you have questions. COMMON BRAND NAME(S): Latuda What should I tell my care team before I take this medication? They need to know if you have any of these conditions: dementia diabetes difficulty swallowing have trouble controlling your muscles heart disease high cholesterol history of breast cancer history of high prolactin levels history of stroke kidney disease liver disease low blood counts, like low white cell, platelet, or red cell counts low blood pressure Parkinson's disease seizures suicidal thoughts, plans, or attempt; a previous suicide attempt by you or a family member an unusual or allergic reaction to lurasidone, other medicines, foods, dyes, or preservatives pregnant or trying to get pregnant breast-feeding How should I use this medication? Take this medicine by mouth with a glass of water. Follow the directions on the prescription label. Take this medicine with food. Take your medicine at regular intervals. Do not take it more often than directed. Do not stop taking except on your doctor's advice. A special MedGuide will be given to you by the pharmacist with each prescription and refill. Be sure to read this information carefully each time. Talk to your pediatrician regarding the use of this medicine in children. While this drug may be prescribed for children as young as 10 years for selected conditions, precautions do apply. Overdosage: If you think you have taken too much of this medicine contact a poison control center or emergency room at once. NOTE: This medicine is only for you. Do not share this medicine with others. What if I miss a dose? If you miss a dose, take it as soon as you can.  If it is almost time for your next dose, take only that dose. Do not take double or extra doses. What may interact with this medication? Do not take this medicine with any of the following medications: avasimibe carbamazepine certain medicines for fungal infections like ketoconazole, voriconazole clarithromycin metoclopramide mibefradil phenytoin rifampin ritonavir St. John's wort This medicine may also interact with the following medications: antihistamines for allergy, cough, and cold bosentan certain medicines for anxiety or sleep certain medicines for depression like amitriptyline, fluoxetine, sertraline certain medicines for HIV or hepatitis erythromycin fluconazole general anesthetics like halothane, isoflurane, methoxyflurane, propofol grapefruit juice levodopa or other medicines for Parkinson's disease medicines for blood pressure medicines for seizures medicines that relax muscles for surgery modafinil nafcillin narcotic medicines for pain phenothiazines like chlorpromazine, prochlorperazine, thioridazine This list may not describe all possible interactions. Give your health care provider a list of all the medicines, herbs, non-prescription drugs, or dietary supplements you use. Also tell them if you smoke, drink alcohol, or use illegal drugs. Some items may interact with your medicine. What should I watch for while using this medication? Visit your health care professional for regular checks on your progress. Tell your health care professional if symptoms do not start to get better or if they get worse. Do not stop taking except on your health care professional's advice. You may develop a severe reaction. Your health care professional will tell you how much medicine to take. Patients and their families should watch out for new or worsening depression or thoughts of suicide. Also watch out for sudden changes in feelings such as feeling anxious, agitated, panicky, irritable,  hostile, aggressive, impulsive,  severely restless, overly excited and hyperactive, or not being able to sleep. If this happens, especially at the beginning of treatment or after a change in dose, call your healthcare professional. This medicine may increase blood sugar. Ask your health care provider if changes in diet or medicines are needed if you have diabetes. You may get dizzy or drowsy. Do not drive, use machinery, or do anything that needs mental alertness until you know how this medicine affects you. Do not stand or sit up quickly, especially if you are an older patient. This reduces the risk of dizzy or fainting spells. Alcohol may interfere with the effect of this medicine. Avoid alcoholic drinks. This medicine may cause dry eyes and blurred vision. If you wear contact lenses you may feel some discomfort. Lubricating drops may help. See your eye doctor if the problem does not go away or is severe. This drug can cause problems with controlling your body temperature. It can lower the response of your body to cold temperatures. If possible, stay indoors during cold weather. If you must go outdoors, wear warm clothes. It can also lower the response of your body to heat. Do not overheat. Do not over-exercise. Stay out of the sun when possible. If you must be in the sun, wear cool clothing. Drink plenty of water. If you have trouble controlling your body temperature, call your health care provider right away. What side effects may I notice from receiving this medication? Side effects that you should report to your doctor or health care professional as soon as possible: allergic reactions like skin rash, itching or hives, swelling of the face, lips, or tongue breathing problems confusion elevated mood, decreased need for sleep, racing thoughts, impulsive behavior fever or chills, sore throat inability to keep still males: prolonged or painful erection problems with balance, talking,  walking seizures signs and symptoms of high blood sugar such as being more thirsty or hungry or having to urinate more than normal. You may also feel very tired or have blurry vision signs and symptoms of low blood pressure like dizziness; feeling faint or lightheaded, falls; unusually weak or tired signs and symptoms of neuroleptic malignant syndrome like confusion; fast or irregular heartbeat; high fever; increased sweating; stiff muscles sudden numbness or weakness of the face, arm, or leg suicidal thoughts or other mood changes trouble swallowing uncontrollable movements of the arms, face, head, mouth, neck, or upper body Side effects that usually do not require medical attention (report to your doctor or health care professional if they continue or are bothersome): drowsiness nausea runny nose tiredness weight gain This list may not describe all possible side effects. Call your doctor for medical advice about side effects. You may report side effects to FDA at 1-800-FDA-1088. Where should I keep my medication? Keep out of the reach of children. Store at room temperature between 15 and 30 degrees C (59 and 86 degrees F). Throw away any unused medicine after the expiration date. NOTE: This sheet is a summary. It may not cover all possible information. If you have questions about this medicine, talk to your doctor, pharmacist, or health care provider. PLEASE CALL [phone] FOR EKG

## 2021-09-14 ENCOUNTER — Other Ambulatory Visit: Payer: Self-pay

## 2021-09-14 ENCOUNTER — Ambulatory Visit
Admission: RE | Admit: 2021-09-14 | Discharge: 2021-09-14 | Disposition: A | Discharge status: Home / Self Care | Payer: Medicaid Other | Source: Ambulatory Visit | Attending: Cardiology | Admitting: Cardiology

## 2021-09-14 DIAGNOSIS — R072 Precordial pain: Secondary | ICD-10-CM | POA: Insufficient documentation

## 2021-09-14 MED ORDER — TECHNETIUM TC 99M TETROFOSMIN IV KIT
32.9700 | PACK | Freq: Once | INTRAVENOUS | Status: AC | PRN
  Administered 2021-09-14: 32.97 via INTRAVENOUS

## 2021-09-14 MED ORDER — TECHNETIUM TC 99M TETROFOSMIN IV KIT
10.0600 | PACK | Freq: Once | INTRAVENOUS | Status: AC | PRN
  Administered 2021-09-14: 10.06 via INTRAVENOUS

## 2021-09-14 MED ORDER — REGADENOSON 0.4 MG/5ML IV SOLN
0.4000 mg | Freq: Once | INTRAVENOUS | Status: AC
Start: 2021-09-14 — End: 2021-09-14
  Administered 2021-09-14: 0.4 mg via INTRAVENOUS

## 2021-09-15 LAB — NM MYOCAR MULTI W/SPECT W/WALL MOTION / EF
LV dias vol: 83 mL (ref 46–106)
LV sys vol: 22 mL
MPHR: 173 {beats}/min
Nuc Stress EF: 73 %
Peak HR: 107 {beats}/min
Percent HR: 61 %
Rest HR: 79 {beats}/min
Rest Nuclear Isotope Dose: 10.1 mCi
SDS: 0
SRS: 1
SSS: 0
ST Depression (mm): 0 mm
Stress Nuclear Isotope Dose: 33 mCi
TID: 0.92

## 2021-09-17 DIAGNOSIS — G43119 Migraine with aura, intractable, without status migrainosus: Secondary | ICD-10-CM | POA: Diagnosis not present

## 2021-09-17 DIAGNOSIS — M797 Fibromyalgia: Secondary | ICD-10-CM | POA: Diagnosis not present

## 2021-09-17 DIAGNOSIS — G500 Trigeminal neuralgia: Secondary | ICD-10-CM | POA: Diagnosis not present

## 2021-09-17 DIAGNOSIS — R278 Other lack of coordination: Secondary | ICD-10-CM | POA: Diagnosis not present

## 2021-09-18 ENCOUNTER — Telehealth: Payer: Self-pay | Admitting: Cardiology

## 2021-09-18 NOTE — Telephone Encounter (Signed)
PA would like to discuss medications to prescribe to patient, please assist

## 2021-09-19 DIAGNOSIS — Z8572 Personal history of non-Hodgkin lymphomas: Secondary | ICD-10-CM | POA: Diagnosis not present

## 2021-09-19 DIAGNOSIS — R682 Dry mouth, unspecified: Secondary | ICD-10-CM | POA: Diagnosis not present

## 2021-09-19 DIAGNOSIS — K1122 Acute recurrent sialoadenitis: Secondary | ICD-10-CM | POA: Diagnosis not present

## 2021-09-19 NOTE — Telephone Encounter (Signed)
Yes Ubrelvy and promethazine are both safer options, neither prolong QTc like her previous meds did.

## 2021-09-19 NOTE — Telephone Encounter (Signed)
Called Kaitlin back and informed her of the recommendations as charted below. She was very grateful for the call back.

## 2021-09-19 NOTE — Telephone Encounter (Signed)
Called and left a VM requesting a call back.

## 2021-09-19 NOTE — Telephone Encounter (Signed)
Spoke with Kerry Dory, PA with [facility]. In reviewing the patients chart she saw that the patient has a history of QTC prolongation. She is being treated for migraines. Kaitlin discontinued the Zofran and Sumatriptin because of the QTC prolongation. She is wanting to prescribe Ubrelvy, and Promethezine as an alternative and wanted to know if this would be safe for the patient.   Will route to Pharm D for recommendations.

## 2021-09-20 ENCOUNTER — Other Ambulatory Visit: Payer: Self-pay | Admitting: Otolaryngology

## 2021-09-20 DIAGNOSIS — D110 Benign neoplasm of parotid gland: Secondary | ICD-10-CM

## 2021-09-20 DIAGNOSIS — Z8572 Personal history of non-Hodgkin lymphomas: Secondary | ICD-10-CM

## 2021-09-22 ENCOUNTER — Other Ambulatory Visit: Payer: Self-pay | Admitting: Physician Assistant

## 2021-09-22 ENCOUNTER — Other Ambulatory Visit: Payer: Self-pay | Admitting: Family Medicine

## 2021-09-22 DIAGNOSIS — N3281 Overactive bladder: Secondary | ICD-10-CM

## 2021-09-23 NOTE — Telephone Encounter (Signed)
Requested medication (s) are due for refill today: no  Requested medication (s) are on the active medication list: yes  Last refill:  12/11/20 #180 3 RF  Future visit scheduled: no  Notes to clinic:  overdue lab work and requested too soon   Requested Prescriptions  Pending Prescriptions Disp Refills   metFORMIN (GLUCOPHAGE) 1000 MG tablet [Pharmacy Med Name: metformin 1,000 mg tablet] 180 tablet 2    Sig: TAKE ONE TABLET BY MOUTH TWICE DAILY with meals     Endocrinology:  Diabetes - Biguanides Failed - 09/22/2021  8:02 AM      Failed - HBA1C is between 0 and 7.9 and within 180 days    Hemoglobin A1C  Date Value Ref Range Status  12/11/2020 8.9 (A) 4.0 - 5.6 % Final   Hgb A1c MFr Bld  Date Value Ref Range Status  06/05/2020 8.9 (H) <5.7 % of total Hgb Final    Comment:    For someone without known diabetes, a hemoglobin A1c value of 6.5% or greater indicates that they may have  diabetes and this should be confirmed with a follow-up  test. . For someone with known diabetes, a value <7% indicates  that their diabetes is well controlled and a value  greater than or equal to 7% indicates suboptimal  control. A1c targets should be individualized based on  duration of diabetes, age, comorbid conditions, and  other considerations. . Currently, no consensus exists regarding use of hemoglobin A1c for diagnosis of diabetes for children. .           Passed - Cr in normal range and within 360 days    Creat  Date Value Ref Range Status  12/11/2020 1.04 0.50 - 1.10 mg/dL Final   Creatinine, Ser  Date Value Ref Range Status  07/31/2021 0.93 0.44 - 1.00 mg/dL Final   Creatinine, Urine  Date Value Ref Range Status  06/05/2020 113 20 - 275 mg/dL Final          Passed - eGFR in normal range and within 360 days    GFR, Est African American  Date Value Ref Range Status  12/11/2020 74 > OR = 60 mL/min/1.55m Final   GFR, Est Non African American  Date Value Ref Range Status   12/11/2020 64 > OR = 60 mL/min/1.797mFinal   GFR, Estimated  Date Value Ref Range Status  07/31/2021 >60 >60 mL/min Final    Comment:    (NOTE) Calculated using the CKD-EPI Creatinine Equation (2021)           Passed - Valid encounter within last 6 months    Recent Outpatient Visits           3 weeks ago [facility]ential [facility]   9 months [facility] type [facility]   9 months [facility]   11 months ago Viral syndrome   [facility]   12 months ago Bipolar I disorder, most recent episode depressed [facility]       Future Appointments             In 2 months [place], BrAaron EdelmanMD [facility]

## 2021-09-24 ENCOUNTER — Other Ambulatory Visit: Payer: Self-pay | Admitting: Psychiatry

## 2021-09-24 DIAGNOSIS — F3161 Bipolar disorder, current episode mixed, mild: Secondary | ICD-10-CM

## 2021-09-24 DIAGNOSIS — F3178 Bipolar disorder, in full remission, most recent episode mixed: Secondary | ICD-10-CM

## 2021-09-24 DIAGNOSIS — F4310 Post-traumatic stress disorder, unspecified: Secondary | ICD-10-CM

## 2021-09-24 NOTE — Telephone Encounter (Signed)
Pt has enough meds to last another month and has an appt 10/19/21 before she runs out

## 2021-09-25 ENCOUNTER — Other Ambulatory Visit: Payer: Self-pay

## 2021-09-26 ENCOUNTER — Ambulatory Visit
Admission: RE | Admit: 2021-09-26 | Discharge: 2021-09-26 | Disposition: A | Discharge status: Home / Self Care | Payer: Medicaid Other | Source: Ambulatory Visit | Attending: Otolaryngology | Admitting: Otolaryngology

## 2021-09-26 ENCOUNTER — Telehealth: Payer: Self-pay | Admitting: *Deleted

## 2021-09-26 ENCOUNTER — Other Ambulatory Visit: Payer: Self-pay

## 2021-09-26 DIAGNOSIS — D110 Benign neoplasm of parotid gland: Secondary | ICD-10-CM | POA: Diagnosis not present

## 2021-09-26 DIAGNOSIS — Z8572 Personal history of non-Hodgkin lymphomas: Secondary | ICD-10-CM

## 2021-09-26 DIAGNOSIS — R599 Enlarged lymph nodes, unspecified: Secondary | ICD-10-CM | POA: Diagnosis not present

## 2021-09-26 DIAGNOSIS — E041 Nontoxic single thyroid nodule: Secondary | ICD-10-CM | POA: Diagnosis not present

## 2021-09-26 NOTE — Telephone Encounter (Signed)
° °  Telephone encounter was:  Successful.  09/26/2021 Name: Apple Dearmas MRN: [id number] DOB: 03-23-1976  Abrie Egloff is a 45 y.o. year old female who is a primary care patient of Bo Merino, FNP . The community resource team was consulted for assistance with Patient needs dental information for [facility], she has [place] medicaid so will email her providers  Care guide performed the following interventions: Patient provided with information about care guide support team and interviewed to confirm resource needs Follow up call placed to community resources to determine status of patients referral.  Follow Up Plan:  No further follow up planned at this time. The patient has been provided with needed resources.  [place], Care Management  [phone] [address] , [place] [address] Email : [email or web address]

## 2021-09-29 ENCOUNTER — Encounter: Payer: Self-pay | Admitting: Oncology

## 2021-10-02 ENCOUNTER — Ambulatory Visit: Payer: Medicaid Other

## 2021-10-03 ENCOUNTER — Telehealth (INDEPENDENT_AMBULATORY_CARE_PROVIDER_SITE_OTHER): Payer: Medicaid Other | Admitting: Psychiatry

## 2021-10-03 ENCOUNTER — Other Ambulatory Visit: Payer: Self-pay

## 2021-10-03 ENCOUNTER — Encounter: Payer: Self-pay | Admitting: Psychiatry

## 2021-10-03 DIAGNOSIS — G4701 Insomnia due to medical condition: Secondary | ICD-10-CM | POA: Diagnosis not present

## 2021-10-03 DIAGNOSIS — F4310 Post-traumatic stress disorder, unspecified: Secondary | ICD-10-CM | POA: Diagnosis not present

## 2021-10-03 DIAGNOSIS — F3161 Bipolar disorder, current episode mixed, mild: Secondary | ICD-10-CM

## 2021-10-03 DIAGNOSIS — F411 Generalized anxiety disorder: Secondary | ICD-10-CM | POA: Diagnosis not present

## 2021-10-03 DIAGNOSIS — Z9189 Other specified personal risk factors, not elsewhere classified: Secondary | ICD-10-CM

## 2021-10-03 MED ORDER — LURASIDONE HCL 40 MG PO TABS: 40.0000 mg | ORAL_TABLET | Freq: Every day | ORAL | 0 refills | Status: DC

## 2021-10-03 NOTE — Telephone Encounter (Signed)
Please obtain ent notes. Obtain pet scan as well and see me thereafter

## 2021-10-03 NOTE — Progress Notes (Signed)
Virtual Visit via Video Note  I connected with Alison [name] on 10/03/21 at  4:30 PM EST by a video enabled telemedicine application and verified that I am speaking with the correct person using two identifiers. Location Provider Location : [facility] Patient Location : Home  Participants: Patient , Provider   I discussed the limitations of evaluation and management by telemedicine and the availability of in person appointments. The patient expressed understanding and agreed to proceed.   I discussed the assessment and treatment plan with the patient. The patient was provided an opportunity to ask questions and all were answered. The patient agreed with the plan and demonstrated an understanding of the instructions.   The patient was advised to call back or seek an in-person evaluation if the symptoms worsen or if the condition fails to improve as anticipated.    [place] MD OP Progress Note  10/03/2021 5:48 PM Alison [name]  MRN:  [id number]  Chief Complaint:  Chief Complaint   Follow-up; Depression    HPI: Alison [name] is a 45 year old Caucasian female, lives in [place], has a history of bipolar disorder, PTSD, multiple medical problems including fibromyalgia, chronic pain, trigeminal neuralgia, history of liver transplant, B-cell lymphoma, SLE, diabetes melitis, OSA, migraine headache was evaluated by telemedicine today.  Patient reports she is tolerating the Latuda well.  She reports she however has noticed possible hypomanic symptoms, often being restless and doing a lot of activities mainly craft projects lately.  She also spent a lot of money buying craft supplies recently.  She reports however given the fact that it is the holiday season and it is the anniversary of when her mother was diagnosed with cancer several years ago, she has been coping better than before.  She believes the [place] is beneficial with the same.  Is interested in increasing the dosage.  Reports sleep is  good.  Denies any suicidality, homicidality or perceptual disturbances.  Patient is currently awaiting a biopsy of her parotid mass, suspicious for parotid adenoma.  That does worry her.  She does report recent weight loss, unexplained as well as night sweats that is worrisome.  Patient denies any other concerns today.    Visit Diagnosis:    ICD-10-CM   1. Bipolar 1 disorder, mixed, mild (HCC)  F31.61 lurasidone (LATUDA) 40 MG TABS tablet    2. PTSD (post-traumatic stress disorder)  F43.10     3. GAD (generalized anxiety disorder)  F41.1     4. 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   Chemo tx's   Migraine  Morbid obesity ([place])    Neuromuscular disorder (HCC)    neuropathy   Neuropathy    Personality disorder ([place])    Post herpetic neuralgia    PTSD (post-traumatic stress disorder)    Renal disorder    S/P liver transplant [facility])     Past Surgical History:  Procedure Laterality Date   BONE MARROW BIOPSY  01/13/2015   BREAST BIOPSY  12/2014   BREAST BIOPSY  2011   BREAST SURGERY     CHOLECYSTECTOMY     COLONOSCOPY     ESOPHAGOGASTRODUODENOSCOPY     ESOPHAGOGASTRODUODENOSCOPY (EGD) WITH PROPOFOL N/A 01/09/2021    Procedure: ESOPHAGOGASTRODUODENOSCOPY (EGD) WITH PROPOFOL;  Surgeon: [name], MD;  Location: [facility] ENDOSCOPY;  Service: Endoscopy;  Laterality: N/A;   HERNIA REPAIR     infusaport     LIVER TRANSPLANT  12/17/1991   LUMBAR PUNCTURE     PORT A CATH INJECTION ([place] HX)     tumor removal  2015    Family Psychiatric History: Reviewed family psychiatric history from progress note on 06/10/2019  Family History:  Family History  Problem Relation Age of Onset   Heart disease Mother    Hypertension Mother    Cancer - Other Mother    Bipolar disorder Mother    Heart disease Father    Hypertension Father    Diabetes Father    Parkinson's disease Maternal Grandmother    Cancer Maternal Aunt    Cancer Maternal Uncle    Cancer Maternal Grandfather    Lupus Paternal Grandmother    Hypertension Brother     Social History: Reviewed social history from progress note on 06/10/2019 Social History   Socioeconomic History   Marital status: Single    Spouse name: Not on file   Number of children: 0   Years of education: 9   Highest education level: GED or equivalent  Occupational History   Occupation: disabled  Tobacco Use   Smoking status: Never   Smokeless tobacco: Never  Vaping Use   Vaping Use: Never used  Substance and Sexual Activity   Alcohol use: Not Currently   Drug use: Not Currently    Types: Marijuana    Comment: pain managment last used in early april   Sexual activity: Not Currently  Other Topics Concern   Not on file  Social History Narrative   Not on file   Social Determinants of Health   Financial Resource Strain: Low Risk    Difficulty of Paying Living Expenses: Not hard at all  Food Insecurity: No Food Insecurity   Worried About Charity fundraiser in the Last Year: Never true   [place] in the Last Year: Never true  Transportation Needs: No Transportation Needs   Lack of Transportation (Medical): No   Lack of Transportation (Non-Medical): No   Physical Activity: Insufficiently Active   Days of Exercise per Week: 1 day   Minutes of Exercise per Session: 30 min  Stress: Stress Concern Present   Feeling of Stress : Rather much  Social Connections: Moderately Integrated   Frequency of Communication with Friends and Family: More than three times a week   Frequency of Social Gatherings with Friends and Family: More than three times a week   Attends Religious Services: More than 4 times per year   Active Member of [employer] or Organizations: No   Attends Archivist Meetings: Never   Marital Status: Living with partner    Allergies:  Allergies  Allergen Reactions   Penicillin G Itching, Rash, Anaphylaxis and Palpitations   Lamictal [Lamotrigine] Rash   Carbamazepine Other (See Comments)    Medication interaction-prograf   Hydrocodone-Acetaminophen Itching   Naproxen Itching    Metabolic Disorder Labs: Lab Results  Component Value Date   HGBA1C 8.9 (A) 12/11/2020   MPG 209 06/05/2020   Lab Results  Component Value Date   PROLACTIN 13.4 07/15/2019   PROLACTIN 21.3 05/24/2019   Lab Results  Component Value Date   CHOL 198 09/07/2021   TRIG 229 (H) 09/07/2021   HDL 39 (L) 09/07/2021   CHOLHDL 5.1 (H) 09/07/2021   LDLCALC 119 (H) 09/07/2021   LDLCALC 133 (H) 12/11/2020   Lab Results  Component Value Date   TSH 2.650 07/15/2019   TSH 4.650 (H) 05/24/2019    Therapeutic Level Labs: No results found for: LITHIUM No results found for: VALPROATE No components found for:  CBMZ  Current Medications: Current Outpatient Medications  Medication Sig Dispense Refill   lurasidone (LATUDA) 40 MG TABS tablet Take 1 tablet (40 mg total) by mouth daily with supper. 90 tablet 0   naloxone (NARCAN) nasal spray 4 mg/0.1 mL ADMINISTER A SINGLE SPRAY IN ONE NOSTRIL UPON SIGNS OF OPIOID OVERDOSE. CALL 911. REPEAT AFTER 3 MINUTES IF NO RESPONSE.     promethazine (PHENERGAN) 25 MG tablet Take by mouth.     Accu-Chek  FastClix Lancets MISC Apply topically 2 (two) times daily.     ALPRAZolam (XANAX) 0.25 MG tablet Take 1 tablet (0.25 mg total) by mouth as directed. Take one tablet 2-3 times a week only for severe panic attacks 15 tablet 1   baclofen (LIORESAL) 10 MG tablet Take 10 mg by mouth 2 (two) times daily.     benztropine (COGENTIN) 1 MG tablet Take 1 tablet (1 mg total) by mouth daily as needed for tremors. 30 tablet 1   Cranberry (RA CRANBERRY) 500 MG CAPS Take by mouth.     Dulaglutide (TRULICITY) 3 DE/0.8XK SOPN Inject 3 mg into the skin once a week.     ergocalciferol (VITAMIN D2) 1.25 MG (50000 UT) capsule Take 1 capsule by mouth once a week.     eszopiclone (LUNESTA) 1 MG TABS tablet Take 1 tablet (1 mg total) by mouth at bedtime as needed for sleep. Take immediately before bedtime 30 tablet 1   ezetimibe (ZETIA) 10 MG tablet Take 10 mg by mouth daily.     glucose blood (COOL BLOOD GLUCOSE TEST STRIPS) test strip Use as instructed 100 each 12   icosapent Ethyl (VASCEPA) 1 g capsule Take 2 capsules (2 g total) by mouth 2 (two) times daily. 120 capsule 5   lisinopril (ZESTRIL) 20 MG tablet Take 1 tablet (20 mg total) by mouth daily. 90 tablet 3   Magnesium 500 MG CAPS Take by mouth.     Melatonin 10 MG TABS Take 10 mg by mouth at bedtime as needed (sleep).     metFORMIN (GLUCOPHAGE) 1000 MG tablet TAKE ONE TABLET BY MOUTH TWICE DAILY with meals 180 tablet 2   Multiple Vitamin (MULTI-VITAMIN) tablet Take 1 tablet by mouth daily.     MYRBETRIQ 50 MG TB24 tablet TAKE ONE TABLET BY MOUTH ONCE DAILY 90 tablet 0   nitrofurantoin, macrocrystal-monohydrate, (MACROBID) 100 MG capsule Take 1 capsule (100 mg total) by mouth 2 (two) times daily. 10 capsule 0   oxyCODONE ER (XTAMPZA ER) 13.5 MG C12A Take 1 tablet by mouth 2 (two) times daily.  pregabalin (LYRICA) 200 MG capsule Take 200 mg by mouth 2 (two) times daily.     tacrolimus (PROGRAF) 1 MG capsule Take 1 capsule by mouth once daily (Patient taking  differently: Take 1 mg by mouth 1 day or 1 dose.) 30 capsule 0   venlafaxine XR (EFFEXOR-XR) 37.5 MG 24 hr capsule TAKE ONE CAPSULE BY MOUTH ONCE DAILY WITH BREAKFAST 90 capsule 0   XYLIMELTS 550 MG DISK USE one BY MOUTH EVERY 8 HOURS AS NEEDED FOR DRY MOUTH     Current Facility-Administered Medications  Medication Dose Route Frequency Provider Last Rate Last Admin   sulfamethoxazole-trimethoprim (BACTRIM DS) 800-160 MG per tablet 1 tablet  1 tablet Oral Q12H Hollice Espy, MD   1 tablet at 01/17/21 1432     Musculoskeletal: Strength & Muscle Tone:  UTA Gait & Station:  Seated Patient leans: N/A  Psychiatric Specialty Exam: Review of Systems  Constitutional:  Positive for unexpected weight change.  Endocrine:       Night sweats  Psychiatric/Behavioral:  The patient is nervous/anxious.   All other systems reviewed and are negative.  Last menstrual period 02/04/2018.There is no height or weight on file to calculate BMI.  General Appearance: Casual  Eye Contact:  Good  Speech:  Clear and Coherent  Volume:  Normal  Mood:  Anxious, likely hypomanic  Affect:  Congruent  Thought Process:  Goal Directed and Descriptions of Associations: Intact  Orientation:  Full (Time, Place, and Person)  Thought Content: Logical   Suicidal Thoughts:  No  Homicidal Thoughts:  No  Memory:  Immediate;   Fair Recent;   Fair Remote;   Fair  Judgement:  Fair  Insight:  Fair  Psychomotor Activity:  Normal  Concentration:  Concentration: Fair and Attention Span: Fair  Recall:  [employer] of Knowledge: Fair  Language: Fair  Akathisia:  No  Handed:  Right  AIMS (if indicated): done,0  Assets:  Communication Skills Desire for Improvement Housing Social Support  ADL's:  Intact  Cognition: WNL  Sleep:  Fair   Screenings: AIMS    Flowsheet Row Video Visit from 05/09/2021 in [place] Total Score 0      [place] Office Visit from  08/29/2021 in [facility] Video Visit from 05/28/2021 in [employer] Video Visit from 05/09/2021 in [place] Visit from 12/04/2020 in [facility]  Total GAD-7 Score 15 9 9 5       PHQ2-9    [place] Visit from 09/12/2021 in [place] Office Visit from 09/04/2021 in [place] from 08/29/2021 in [facility] Office Visit from 07/20/2021 in [place] Nutrition from 03/28/2021 in [place]  PHQ-2 Total Score 2 2 3 3 3   PHQ-9 Total Score 8 13 19 15 18       [place] Office Visit from 09/12/2021 in [place] Office Visit from 09/04/2021 in [place] Office Visit from 07/20/2021 in [place] No Risk No Risk No Risk        Assessment and Plan: Alison [name] is a 45 year old Caucasian female on disability, has a history of bipolar disorder, PTSD, history of borderline personality disorder, chronic pain, fibromyalgia, SLE, diabetes, OSA, migraine headaches, recent right sided swelling of the face, suspicious for parotid adenoma, was evaluated  by telemedicine today.  Patient with possible hypomanic symptoms will benefit from further medication readjustment.  Plan Bipolar disorder type I mixed mild-improving Increase Latuda to 40 mg p.o. daily with supper Cogentin 1 mg p.o. daily as needed for side effects of Latuda. Venlafaxine extended release 37.5 mg p.o. daily  GAD-improving Venlafaxine extended release 37.5 mg p.o. daily Xanax 0.25 mg as needed for severe anxiety attacks Continue CBT  PTSD-stable Continue venlafaxine as prescribed  Insomnia-improving Lunesta 1 mg p.o. nightly Sleep study completed-pending  report  At risk for prolonged QT syndrome-EKG pending.  We will coordinate care with her primary provider, oncologist-patient advised to sign ROI.  Follow-up in clinic in 2 to 3 weeks or sooner if needed.  This note was generated in part or whole with voice recognition software. Voice recognition is usually quite accurate but there are transcription errors that can and very often do occur. I apologize for any typographical errors that were not detected and corrected.     [name], MD 10/03/2021, 5:48 PM

## 2021-10-04 ENCOUNTER — Ambulatory Visit
Admission: RE | Admit: 2021-10-04 | Discharge: 2021-10-04 | Disposition: A | Discharge status: Home / Self Care | Payer: Medicaid Other | Source: Ambulatory Visit | Attending: Family Medicine | Admitting: Family Medicine

## 2021-10-04 ENCOUNTER — Other Ambulatory Visit: Payer: Self-pay

## 2021-10-04 DIAGNOSIS — Z1231 Encounter for screening mammogram for malignant neoplasm of breast: Secondary | ICD-10-CM

## 2021-10-05 ENCOUNTER — Encounter: Payer: Self-pay | Admitting: Oncology

## 2021-10-07 HISTORY — PX: BREAST BIOPSY: SHX20

## 2021-10-09 ENCOUNTER — Other Ambulatory Visit: Payer: Self-pay | Admitting: Family Medicine

## 2021-10-09 DIAGNOSIS — F4310 Post-traumatic stress disorder, unspecified: Secondary | ICD-10-CM | POA: Diagnosis not present

## 2021-10-09 DIAGNOSIS — F3181 Bipolar II disorder: Secondary | ICD-10-CM | POA: Diagnosis not present

## 2021-10-09 DIAGNOSIS — R928 Other abnormal and inconclusive findings on diagnostic imaging of breast: Secondary | ICD-10-CM

## 2021-10-09 DIAGNOSIS — F411 Generalized anxiety disorder: Secondary | ICD-10-CM | POA: Diagnosis not present

## 2021-10-10 ENCOUNTER — Other Ambulatory Visit: Payer: Self-pay

## 2021-10-10 ENCOUNTER — Other Ambulatory Visit: Payer: Self-pay | Admitting: Obstetrics and Gynecology

## 2021-10-10 NOTE — Patient Instructions (Signed)
Hi Alison [name], thank you for speaking to me today-have a wonderful afternoon!  Alison [name] was given information about Medicaid Managed Care team care coordination services as a part of their Healthy [facility] Medicaid benefit. Alison [name] verbally consented to engagement with the [facility] Managed Care team.   If you are experiencing a medical emergency, please call 911 or report to your local emergency department or urgent care.   If you have a non-emergency medical problem during routine business hours, please contact your provider's office and ask to speak with a nurse.   For questions related to your Healthy [facility] health plan, please call: [phone] or visit the homepage here: [email or web address]  If you would like to schedule transportation through your Healthy [facility] plan, please call the following number at least 2 days in advance of your appointment: [phone]  Call the [place] at [phone], at any time, 24 hours a day, 7 days a week. If you are in danger or need immediate medical attention call 911.  If you would like help to quit smoking, call 1-800-QUIT-NOW 548-514-0534) OR Espaol: 1-855-Djelo-Ya (9-432-761-4709) o para ms informacin haga clic aqu or Text READY to 200-400 to register via text  Alison [name] - following are the goals we discussed in your visit today:   Goals Addressed             This Visit's Progress    Chronic Pain Managed       Update 10/10/21:  No complaints today   Evidence-based guidance:  Address common beliefs about pain, such as pain is to be endured, a normal part of aging or that it is not "real"; feelings of resignation that nothing can be done and that complaining will be a sign of weakness.  Assess pain level, treatment efficacy and patient response at regular intervals using a consistent pain scale.  Assess pain using self-report (most reliable),  family/caregiver report, validated pain scale; consider impact on quality of life.  Determine if pain is associated with mobility or at rest, location, intensity, frequency, duration, recurrence, pattern and description (e.g., cramping, burning, aching), triggers and relieving factors.   Anticipate referral to pain education program, pain management support or community resources for specific diagnoses (e.g., cancer, fibromyalgia, multiple sclerosis).  Explore fears associated with anticipated or imagined pain; encourage acceptance-based approaches.  Encourage exposure to experiences previously avoided due to fear of pain.  Anticipate referral to pain management specialist, physical therapist, addiction specialist (if history of substance use), psychotherapist; advocate for consultation with pharmacist.  Initiate nonpharmacologic measures, such as cognitive behavior therapy, mindfulness, guided imagery, massage, distraction, relaxation, chiropractic manipulation, dietary supplements or acupuncture.  Provide multimodal treatment interventions, such as physical activity, therapeutic exercise, yoga, TENS (transcutaneous electrical nerve stimulation) and manual therapy.  Train in functional activity modifications, such as body mechanics, posture, ergonomics, energy conservation and activity pacing.  Encourage use of local anesthetic or analgesic therapy as an adjunct for pain control (e.g., lidocaine patch, capsaicin cream, topical nonsteroidal anti-inflammatory drugs).  Prepare patient for use of pharmacologic therapy in a stepped approach that may include acetaminophen, nonsteroidal anti-inflammatory drugs, opioid, antiepileptic, antidepressant or nonbenzodiazepine muscle relaxant.  Review efficacy, tolerability, adherence and manage medication-induced side effects.     The patient verbalized understanding of instructions provided today and agreed to receive a mailed copy of patient instruction and/or  educational materials.  The Managed Medicaid care management team will reach out to the patient again over the next 30 days.  The  Patient  has been provided with contact information for the Managed Medicaid care management team and has been advised to call with any health related questions or concerns.   Alison Raider RN, [name] [place]   [facility] - Managed Medicaid High Risk [phone].   Following is a copy of your plan of care:  Care Plan : Chronic Pain (Adult)  Updates made by [name], RN since 10/10/2021 12:00 AM     Problem: Chronic Pain Management-fibromyalgia   Priority: High  Onset Date: 01/22/2021     Long-Range Goal: Fibromyalgia pain managed-new pain management provider   Start Date: 08/14/2020  Expected End Date: 12/08/2021  Recent Progress: On track  Priority: High  Note:   Current Barriers:  Care Coordination needs related to pain management provider.   Patient is experiencing fibromyalgia flair and is interested in help being connected to a pain management provider. Unable to independently secure referral or appointment for pain management provider. Update 08/06/21:  Patient continues to see Dr. Humphrey Rolls and her neurologist-has appointment this week.  C/O headache-refilled pain medicine. Update 09/06/21:  No complaints today-patient states pain is managed 10/10/21:  Patient has follow up appointments scheduled for MMG, PET scan, CARDS appt, and [facility] oncology for parotid mass? Adenoma.  Feels like any pain that she has is managed right now.  Nurse Case Manager Clinical Goal(s):  Over the next 45 days, patient will work with [facility] to address needs related to referral for pain management provider and associated care coordination needs. Update 09/22/20:  patient is currently seeing Dr. Humphrey Rolls at [facility].  Interventions:  Inter-disciplinary care team collaboration (see longitudinal plan of care) Evaluation  of current treatment plan related to fibromyalgia  and patient's adherence to plan as established by provider. Update 06/15/21:  Patient taking Baclofen again, Effexor added. Collaborated with primary care provider regarding recommendations and referral to pain management provider. Discussed plans with patient for ongoing care management follow up and provided patient with direct contact information for care management team Anticipate pain education program, pain management support as part of pain management referral.       Patient Goals/Self-Care Activities Over the next 45 days, patient will:  -Attends all scheduled provider appointments  develop a personal pain management plan with your pain management provider.  Follow Up Plan:  The Managed Medicaid care management team will reach out to the patient again over the next 30 days

## 2021-10-10 NOTE — Patient Outreach (Signed)
Medicaid Managed Care   Nurse Care Manager Note  10/10/2021 Name:  Alison [name] MRN:  [id number] DOB:  12-Apr-1976  Alison [name] is an 46 y.o. year old female who is a primary patient of Bo Merino, FNP.  The [facility] Managed Care Coordination team was consulted for assistance with:    Chronic healthcare management needs.  Ms. Mcclenney was given information about Medicaid Managed Care Coordination team services today. [name] Patient agreed to services and verbal consent obtained.  Engaged with patient by telephone for follow up visit in response to provider referral for case management and/or care coordination services.   Assessments/Interventions:  Review of past medical history, allergies, medications, health status, including review of consultants reports, laboratory and other test data, was performed as part of comprehensive evaluation and provision of chronic care management services.  SDOH (Social Determinants of Health) assessments and interventions performed: SDOH Interventions    Flowsheet Row Most Recent Value  SDOH Interventions   Intimate Partner Violence Interventions Intervention Not Indicated  Stress Interventions Intervention Not Indicated       Care Plan  Allergies  Allergen Reactions   Penicillin G Itching, Rash, Anaphylaxis and Palpitations   Lamictal [Lamotrigine] Rash   Carbamazepine Other (See Comments)    Medication interaction-prograf   Hydrocodone-Acetaminophen Itching   Naproxen Itching    Medications Reviewed Today     Reviewed by [name], RN (Registered Nurse) on 10/10/21 at 1245  Med List Status: <None>   Medication Order Taking? Sig Documenting Provider Last Dose Status Informant  Accu-Chek FastClix Lancets MISC [id number] No Apply topically 2 (two) times daily. [provider] Taking Active   ALPRAZolam (XANAX) 0.25 MG tablet [id number]  Take 1 tablet (0.25 mg total) by mouth as directed. Take one tablet  2-3 times a week only for severe panic attacks [name], [name], MD  Active   baclofen (LIORESAL) 10 MG tablet [id number] No Take 10 mg by mouth 2 (two) times daily. [provider] Taking Active   benztropine (COGENTIN) 1 MG tablet [id number] No Take 1 tablet (1 mg total) by mouth daily as needed for tremors. [name], MD Taking Active Self  Cranberry (RA CRANBERRY) 500 MG CAPS [id number]  Take by mouth. [provider]  Active   Dulaglutide (TRULICITY) 3 EK/8.0KL SOPN [id number] No Inject 3 mg into the skin once a week. [provider] Taking Active   ergocalciferol (VITAMIN D2) 1.25 MG (50000 UT) capsule [id number] No Take 1 capsule by mouth once a week. [provider] Taking Active   eszopiclone (LUNESTA) 1 MG TABS tablet [id number]  Take 1 tablet (1 mg total) by mouth at bedtime as needed for sleep. Take immediately before bedtime [name], [name], MD  Active   ezetimibe (ZETIA) 10 MG tablet [id number] No Take 10 mg by mouth daily. [provider] Taking Active   glucose blood (COOL BLOOD GLUCOSE TEST STRIPS) test strip [id number] No Use as instructed Steele Sizer, MD Taking Active   icosapent Ethyl (VASCEPA) 1 g capsule [id number] No Take 2 capsules (2 g total) by mouth 2 (two) times daily. [name], MD Taking Active   lisinopril (ZESTRIL) 20 MG tablet [id number] No Take 1 tablet (20 mg total) by mouth daily. [name], PA-C Taking Active   lurasidone (LATUDA) 40 MG TABS tablet [id number]  Take 1 tablet (40 mg total) by mouth daily with supper. [name], MD  Active   Magnesium 500 MG CAPS [id number] No Take by  mouth. [provider] Taking Active   Melatonin 10 MG TABS [id number] No Take 10 mg by mouth at bedtime as needed (sleep). [provider] Taking Active Self  metFORMIN (GLUCOPHAGE) 1000 MG tablet [id number]  TAKE ONE TABLET BY MOUTH TWICE DAILY with meals Bo Merino, FNP  Active   Multiple Vitamin  (MULTI-VITAMIN) tablet [id number] No Take 1 tablet by mouth daily. [provider] Taking Active Self  MYRBETRIQ 50 MG TB24 tablet [id number]  TAKE ONE TABLET BY MOUTH ONCE DAILY Vaillancourt, Samantha, PA-C  Active   naloxone (NARCAN) nasal spray 4 mg/0.1 mL [id number]  ADMINISTER A SINGLE SPRAY IN ONE NOSTRIL UPON SIGNS OF OPIOID OVERDOSE. CALL 911. REPEAT AFTER 3 MINUTES IF NO RESPONSE. [provider]  Active   nitrofurantoin, macrocrystal-monohydrate, (MACROBID) 100 MG capsule [id number] No Take 1 capsule (100 mg total) by mouth 2 (two) times daily. [name], PA-C Taking Active   oxyCODONE ER [facility] ER) 13.5 MG C12A [id number] No Take 1 tablet by mouth 2 (two) times daily. [provider] Taking Active   pregabalin (LYRICA) 200 MG capsule [id number] No Take 200 mg by mouth 2 (two) times daily. [provider] Taking Active   promethazine (PHENERGAN) 25 MG tablet [id number]  Take by mouth. [provider]  Active   sulfamethoxazole-trimethoprim (BACTRIM DS) 800-160 MG per tablet 1 tablet [id number]   Hollice Espy, MD  Active   tacrolimus (PROGRAF) 1 MG capsule [id number] No Take 1 capsule by mouth once daily  Patient taking differently: Take 1 mg by mouth 1 day or 1 dose.   [name], FNP Taking Active Self           Med Note [name], [name]   Fri Jan 19, 2021  5:01 AM)    venlafaxine XR (EFFEXOR-XR) 37.5 MG 24 hr capsule [id number]  TAKE ONE CAPSULE BY MOUTH ONCE DAILY WITH Eldridge Abrahams, MD  Active   XYLIMELTS 550 MG DISK [id number] No USE one BY MOUTH EVERY 8 HOURS AS NEEDED FOR DRY MOUTH [provider] Taking Active             Patient Active Problem List   Diagnosis Date Noted   At risk for prolonged QT interval syndrome 09/12/2021   Prolonged Q-T interval on ECG 08/29/2021   Insomnia 07/20/2021   Bipolar disorder, in full remission, most recent episode mixed ([place]) 07/20/2021   Bipolar 1 disorder, mixed, mild  ([place]) 05/09/2021   GAD (generalized anxiety disorder) 05/09/2021   Hepatic cirrhosis ([place])    Opioid dependence with opioid-induced disorder ([place]) 12/20/2020   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   Bipolar disorder, in full remission, most recent episode depressed ([place]) 09/27/2020   Suprapubic pain 07/25/2020   Bipolar 1 disorder, depressed, mild ([place]) 07/10/2020   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   Edema of lower extremity 09/16/2019   Carpal tunnel syndrome, left 07/26/2019   Cubital tunnel syndrome on left 07/26/2019   Bipolar I disorder, most recent episode depressed ([place]) 07/12/2019   PTSD (post-traumatic stress disorder) 15/17/6160   Umbilical hernia without obstruction and without gangrene 06/17/2019   Encounter for surveillance of abnormal nevi 06/17/2019   Pineal gland cyst 06/17/2019   Chronic hip pain, left 06/02/2019   Lumbar spondylosis 06/02/2019   Mouth dryness 06/02/2019   Obesity (BMI 35.0-39.9 without comorbidity) 06/02/2019   Goals of care, counseling/discussion 05/18/2019   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[place]) 05/18/2019   Marginal zone  B-cell lymphoma ([place]) 05/18/2019   Occipital neuralgia 05/13/2019   Plantar fasciitis of left foot 04/27/2019   Fibromyalgia 03/23/2019   Systemic lupus erythematosus (SLE) in adult [facility]) 03/23/2019   Essential hypertension 03/23/2019   Hepatitis 03/23/2019   Mass of left kidney 03/23/2019   Diabetes mellitus ([place]) 03/23/2019   Nausea without vomiting 03/23/2019   Neuropathy 03/23/2019   Cancer ([place]) 03/23/2019   Hyperlipidemia 03/23/2019   Osteoarthritis 03/23/2019   Trigeminal neuralgia 03/23/2019   Chronic renal disease, stage III ([place]) 03/23/2019   Nephrolithiasis 03/23/2019   Migraine 03/23/2019   OSA on CPAP 03/23/2019   Fibrocystic breast 03/23/2019   Heart murmur 03/23/2019   Bipolar disorder, in partial  remission, most recent episode depressed ([place]) 07/02/2017   Abnormal uterine bleeding 03/18/2017   Major depressive disorder, recurrent ([place]) 03/18/2017   Morbid obesity ([place]) 03/18/2017   Secondary hyperparathyroidism ([place]) 02/07/2017   Neuropathic pain 11/19/2016   Post herpetic neuralgia 11/19/2016   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[place]) 01/13/2015   Lumbar disc disease with radiculopathy 07/26/2013   S/P liver transplant ([place]) 07/26/2013   Type 2 diabetes mellitus, uncontrolled 07/26/2013   Facial nerve disorder 06/29/2012   Low back pain 12/26/2010    Conditions to be addressed/monitored per PCP order:   chronic healthcare management needs, DM, chronic pain, OSA, migraines, LBP, anxiety, depression, bipolar, PTSD, CKD, LDD, PTLD-h/o liver transplant, HLD, DLBCL,   Care Plan : Chronic Pain (Adult)  Updates made by [name], RN since 10/10/2021 12:00 AM     Problem: Chronic Pain Management-fibromyalgia   Priority: High  Onset Date: 01/22/2021     Long-Range Goal: Fibromyalgia pain managed-new pain management provider   Start Date: 08/14/2020  Expected End Date: 12/08/2021  Recent Progress: On track  Priority: High  Note:   Current Barriers:  Care Coordination needs related to pain management provider.   Patient is experiencing fibromyalgia flair and is interested in help being connected to a pain management provider. Unable to independently secure referral or appointment for pain management provider. Update 08/06/21:  Patient continues to see Dr. Humphrey Rolls and her neurologist-has appointment this week.  C/O headache-refilled pain medicine. Update 09/06/21:  No complaints today-patient states pain is managed 10/10/21:  Patient has follow up appointments scheduled for MMG, PET scan, CARDS appt, and [place] [facility] oncology for parotid mass? Adenoma.  Feels like any pain that she has is managed right now.  Nurse Case Manager Clinical Goal(s):  Over the next 45 days, patient will work with  [facility] to address needs related to referral for pain management provider and associated care coordination needs. Update 09/22/20:  patient is currently seeing Dr. Humphrey Rolls at [facility].  Interventions:  Inter-disciplinary care team collaboration (see longitudinal plan of care) Evaluation of current treatment plan related to fibromyalgia  and patient's adherence to plan as established by provider. Update 06/15/21:  Patient taking Baclofen again, Effexor added. Collaborated with primary care provider regarding recommendations and referral to pain management provider. Discussed plans with patient for ongoing care management follow up and provided patient with direct contact information for care management team Anticipate pain education program, pain management support as part of pain management referral.       Patient Goals/Self-Care Activities Over the next 45 days, patient will:  -Attends all scheduled provider appointments  develop a personal pain management plan with your pain management provider.  Follow Up Plan:  The Managed Medicaid care management team will reach out to  the patient again over the next 30 days.     Follow Up:  Patient agrees to Care Plan and Follow-up.  Plan: The Managed Medicaid care management team will reach out to the patient again over the next 30 days. and The  Patient has been provided with contact information for the Managed Medicaid care management team and has been advised to call with any health related questions or concerns.  Date/time of next scheduled RN care management/care coordination outreach:  11/06/21 at 1230.

## 2021-10-11 ENCOUNTER — Encounter: Payer: Self-pay | Admitting: Oncology

## 2021-10-11 NOTE — Telephone Encounter (Signed)
We can get it done in the next 1-2 weeks

## 2021-10-12 ENCOUNTER — Other Ambulatory Visit: Payer: Self-pay | Admitting: *Deleted

## 2021-10-12 DIAGNOSIS — C8580 Other specified types of non-Hodgkin lymphoma, unspecified site: Secondary | ICD-10-CM

## 2021-10-16 ENCOUNTER — Other Ambulatory Visit (HOSPITAL_COMMUNITY)
Admission: RE | Admit: 2021-10-16 | Discharge: 2021-10-16 | Disposition: A | Discharge status: Home / Self Care | Payer: Medicaid Other | Source: Ambulatory Visit | Attending: Obstetrics and Gynecology | Admitting: Obstetrics and Gynecology

## 2021-10-16 ENCOUNTER — Ambulatory Visit: Payer: Medicaid Other | Admitting: Obstetrics and Gynecology

## 2021-10-16 ENCOUNTER — Encounter: Payer: Self-pay | Admitting: Obstetrics and Gynecology

## 2021-10-16 ENCOUNTER — Other Ambulatory Visit: Payer: Self-pay

## 2021-10-16 VITALS — BP 136/74 | Ht 66.0 in | Wt 243.6 lb

## 2021-10-16 DIAGNOSIS — N950 Postmenopausal bleeding: Secondary | ICD-10-CM | POA: Diagnosis not present

## 2021-10-16 DIAGNOSIS — Z124 Encounter for screening for malignant neoplasm of cervix: Secondary | ICD-10-CM

## 2021-10-16 DIAGNOSIS — D62 Acute posthemorrhagic anemia: Secondary | ICD-10-CM | POA: Diagnosis not present

## 2021-10-16 LAB — CBC WITH DIFFERENTIAL
Basophils Absolute: 0.1 10*3/uL (ref 0.0–0.2)
Basos: 1 %
EOS (ABSOLUTE): 0.4 10*3/uL (ref 0.0–0.4)
Eos: 6 %
Hematocrit: 44 % (ref 34.0–46.6)
Hemoglobin: 14.6 g/dL (ref 11.1–15.9)
Immature Grans (Abs): 0 10*3/uL (ref 0.0–0.1)
Immature Granulocytes: 0 %
Lymphocytes Absolute: 2 10*3/uL (ref 0.7–3.1)
Lymphs: 29 %
MCH: 29.2 pg (ref 26.6–33.0)
MCHC: 33.2 g/dL (ref 31.5–35.7)
MCV: 88 fL (ref 79–97)
Monocytes Absolute: 0.6 10*3/uL (ref 0.1–0.9)
Monocytes: 9 %
Neutrophils Absolute: 3.8 10*3/uL (ref 1.4–7.0)
Neutrophils: 55 %
RBC: 5 x10E6/uL (ref 3.77–5.28)
RDW: 13.7 % (ref 11.7–15.4)
WBC: 6.9 10*3/uL (ref 3.4–10.8)

## 2021-10-16 MED ORDER — MEDROXYPROGESTERONE ACETATE 5 MG PO TABS: ORAL_TABLET | ORAL | 0 refills | Status: DC

## 2021-10-16 NOTE — Patient Instructions (Signed)
Endometrial Biopsy An endometrial biopsy is a procedure to remove tissue samples from the endometrium, which is the lining of the uterus. The tissue that is removed can then be checked under a microscope for disease. This procedure is used to diagnose conditions such as endometrial cancer, endometrial tuberculosis, polyps, or other inflammatory conditions. This procedure may also be used to investigate uterine bleeding to determine where you are in your menstrual cycle or how your hormone levels are affecting the lining of the uterus. Tell a health care provider about: Any allergies you have. All medicines you are taking, including vitamins, herbs, eye drops, creams, and over-the-counter medicines. Any problems you or family members have had with anesthetic medicines. Any blood disorders you have. Any surgeries you have had. Any medical conditions you have. Whether you are pregnant or may be pregnant. What are the risks? Generally, this is a safe procedure. However, problems may occur, including: Bleeding. Pelvic infection. Puncture of the wall of the uterus with the biopsy device (rare). Allergic reactions to medicines. What happens before the procedure? Keep a record of your menstrual cycles as told by your health care provider. You may need to schedule your procedure for a specific time in your cycle. You may want to bring a sanitary pad to wear after the procedure. Plan to have someone take you home from the hospital or clinic. Ask your health care provider about: Changing or stopping your regular medicines. This is especially important if you are taking diabetes medicines, arthritis medicines, or blood thinners. Taking medicines such as aspirin and ibuprofen. These medicines can thin your blood. Do not take these medicines unless your health care provider tells you to take them. Taking over-the-counter medicines, vitamins, herbs, and supplements. What happens during the procedure? You  will lie on an exam table with your feet and legs supported as in a pelvic exam. Your health care provider will insert an instrument (speculum) into your vagina to see your cervix. Your cervix will be cleansed with an antiseptic solution. A medicine (local anesthetic) will be used to numb the cervix. A forceps instrument (tenaculum) will be used to hold your cervix steady for the biopsy. A thin, rod-like instrument (uterine sound) will be inserted through your cervix to determine the length of your uterus and the location where the biopsy sample will be removed. A thin, flexible tube (catheter) will be inserted through your cervix and into the uterus. The catheter will be used to collect the biopsy sample from your endometrial tissue. The catheter and speculum will then be removed, and the tissue sample will be sent to a lab for examination. The procedure may vary among health care providers and hospitals. What can I expect after procedure? You will rest in a recovery area until you are ready to go home. You may have mild cramping and a small amount of vaginal bleeding. This is normal. You may have a small amount of vaginal bleeding for a few days. This is normal. It is up to you to get the results of your procedure. Ask your health care provider, or the department that is doing the procedure, when your results will be ready. Follow these instructions at home: Take over-the-counter and prescription medicines only as told by your health care provider. Do not douche, use tampons, or have sexual intercourse until your health care provider approves. Return to your normal activities as told by your health care provider. Ask your health care provider what activities are safe for you. Follow instructions  from your health care provider about any activity restrictions, such as restrictions on strenuous exercise or heavy lifting. Keep all follow-up visits. This is important. Contact a health care  provider: You have heavy bleeding, or bleed for longer than 2 days after the procedure. You have bad smelling discharge from your vagina. You have a fever or chills. You have a burning sensation when urinating or you have difficulty urinating. You have severe pain in your lower abdomen. Get help right away if you: You have severe cramps in your stomach or back. You pass large blood clots. Your bleeding increases. You become weak or light-headed, or you faint or lose consciousness. Summary An endometrial biopsy is a procedure to remove tissue samples is taken from the endometrium, which is the lining of the uterus. The tissue sample that is removed will be checked under a microscope for disease. This procedure is used to diagnose conditions such as endometrial cancer, endometrial tuberculosis, polyps, or other inflammatory conditions. After the procedure, it is common to have mild cramping and a small amount of vaginal bleeding for a few days. Do not douche, use tampons, or have sexual intercourse until your health care provider approves. Ask your health care provider which activities are safe for you. This information is not intended to replace advice given to you by your health care provider. Make sure you discuss any questions you have with your health care provider. Document Revised: 06/06/2021 Document Reviewed: 04/17/2020 Elsevier Patient Education  2022 [place]. Postmenopausal Bleeding Postmenopausal bleeding is any bleeding that a woman has after she has entered menopause. Menopause is the end of a woman's fertile years. After menopause, a woman no longer ovulates and does not have menstrual periods. Therefore, she should no longer have bleeding from her vagina. Postmenopausal bleeding may have various causes, including: Menopausal hormone therapy (MHT). Endometrial atrophy. After menopause, low estrogen hormone levels cause the membrane that lines the uterus (endometrium) to  become thin. You may have bleeding as the endometrium thins. Endometrial hyperplasia. This condition is caused by excess estrogen hormones and low levels of progesterone hormones. The excess estrogen causes the endometrium to thicken, which can lead to bleeding. In some cases, this can lead to cancer of the uterus. Endometrial cancer. Noncancerous growths (polyps) on the endometrium, the lining of the uterus, or the cervix. Uterine fibroids. These are noncancerous growths in or around the uterus muscle tissue that can cause heavy bleeding. Any type of postmenopausal bleeding, even if it appears to be a typical menstrual period, should be checked by your health care provider. Treatment will depend on the cause of the bleeding. Follow these instructions at home:  Pay attention to any changes in your symptoms. Let your health care provider know about them. Avoid using tampons and douches as told by your health care provider. Change your pads regularly. Get regular pelvic exams, including Pap tests, as told by your health care provider. Take iron supplements as told by your health care provider. Take over-the-counter and prescription medicines only as told by your health care provider. Keep all follow-up visits. This is important. Contact a health care provider if: You have new bleeding from the vagina after menopause. You have pain in your abdomen. Get help right away if: You have a fever or chills. You have severe pain with bleeding. You are passing blood clots. You have heavy bleeding, need more than 1 pad an hour, and have never experienced this before. You have headaches or feel faint or dizzy.  Summary Postmenopausal bleeding is any bleeding that a woman has after she has entered into menopause. Postmenopausal bleeding may have various causes. Treatment will depend on the cause of the bleeding. Any type of postmenopausal bleeding, even if it appears to be a typical menstrual period,  should be checked by your health care provider. Be sure to pay attention to any changes in your symptoms and keep all follow-up visits. This information is not intended to replace advice given to you by your health care provider. Make sure you discuss any questions you have with your health care provider. Document Revised: 03/09/2020 Document Reviewed: 03/09/2020 Elsevier Patient Education  [place].

## 2021-10-16 NOTE — Progress Notes (Signed)
Patient ID: Alison [name], female   DOB: 06/01/1976, 46 y.o.   MRN: [id number]  Reason for Consult: excessive bleeding (Excessive bleeding x 6 days.[name] )   Referred by No ref. provider found  Subjective:     HPI:  Alison [name] is a 46 y.o. female.  She is here today with complaints of postmenopausal bleeding.  She reports that she has been having excessive bleeding for the last 2 days.  She is using an ultra tampon which she changes about every 3 hours.  She is passing clots with the tampons.  She reports that she had a similar episode approximately 2 years ago when she had first entered menopause.  This episode resolved on its own and she did not have any endometrial biopsy.  She reports that she has a history of lymphoma.  She is currently in remission although there has been concern regarding a left breast mass and a parotid gland mass that are planned for biopsy.  She has a PET scan which is supposed to occur in the near future.  She reports that her platelets have been low.  She notes complaints of fatigue and dizziness.  Gynecological History  Patient's last menstrual period was 02/04/2018 (approximate). Menarche: 10 Menopause: 38  History of fibroids, polyps, or ovarian cysts? : yes  History of PCOS? no Hstory of Endometriosis? no History of abnormal pap smears? no Have you had any sexually transmitted infections in the past? no  Last Pap: 2020 per patient   She identifies as a female. She is sexually active with men.   She denies dyspareunia. She denies postcoital bleeding.  She currently uses none for contraception.   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   Chemo tx's   Migraine    Morbid obesity (HCC)    Neuromuscular disorder (HCC)    neuropathy   Neuropathy    Personality disorder ([place])    Post herpetic neuralgia    PTSD (post-traumatic stress disorder)    Renal disorder    S/P liver transplant ([place])    Family History  Problem Relation Age of Onset   Heart disease Mother    Hypertension Mother    Cancer - Other Mother    Bipolar disorder Mother    Heart disease Father    Hypertension Father    Diabetes Father    Parkinson's disease Maternal Grandmother    Cancer Maternal Aunt    Cancer Maternal Uncle    Cancer Maternal Grandfather    Lupus Paternal Grandmother    Hypertension  Brother    Past Surgical History:  Procedure Laterality Date   BONE MARROW BIOPSY  01/13/2015   BREAST BIOPSY  12/2014   BREAST BIOPSY  2011   BREAST SURGERY     CHOLECYSTECTOMY     COLONOSCOPY     ESOPHAGOGASTRODUODENOSCOPY     ESOPHAGOGASTRODUODENOSCOPY (EGD) WITH PROPOFOL N/A 01/09/2021   Procedure: ESOPHAGOGASTRODUODENOSCOPY (EGD) WITH PROPOFOL;  Surgeon: [name], MD;  Location: [facility] ENDOSCOPY;  Service: Endoscopy;  Laterality: N/A;   HERNIA REPAIR     infusaport     LIVER TRANSPLANT  12/17/1991   LUMBAR PUNCTURE     PORT A CATH INJECTION ([place] [facility])     tumor removal  2015    Short Social History:  Social History   Tobacco Use   Smoking status: Never   Smokeless tobacco: Never  Substance Use Topics   Alcohol use: Not Currently    Allergies  Allergen Reactions   Penicillin G Itching, Rash, Anaphylaxis and Palpitations    Lamictal [Lamotrigine] Rash   Carbamazepine Other (See Comments)    Medication interaction-prograf   Hydrocodone-Acetaminophen Itching   Naproxen Itching    Current Outpatient Medications  Medication Sig Dispense Refill   Accu-Chek FastClix Lancets MISC Apply topically 2 (two) times daily.     ALPRAZolam (XANAX) 0.25 MG tablet Take 1 tablet (0.25 mg total) by mouth as directed. Take one tablet 2-3 times a week only for severe panic attacks 15 tablet 1   baclofen (LIORESAL) 10 MG tablet Take 10 mg by mouth 2 (two) times daily.     benztropine (COGENTIN) 1 MG tablet Take 1 tablet (1 mg total) by mouth daily as needed for tremors. 30 tablet 1   Cranberry 500 MG CAPS Take by mouth.     Dulaglutide (TRULICITY) 3 VQ/2.5ZD SOPN Inject 3 mg into the skin once a week.     ergocalciferol (VITAMIN D2) 1.25 MG (50000 UT) capsule Take 1 capsule by mouth once a week.     eszopiclone (LUNESTA) 1 MG TABS tablet Take 1 tablet (1 mg total) by mouth at bedtime as needed for sleep. Take immediately before bedtime 30 tablet 1   ezetimibe (ZETIA) 10 MG tablet Take 10 mg by mouth daily.     glucose blood (COOL BLOOD GLUCOSE TEST STRIPS) test strip Use as instructed 100 each 12   icosapent Ethyl (VASCEPA) 1 g capsule Take 2 capsules (2 g total) by mouth 2 (two) times daily. 120 capsule 5   lisinopril (ZESTRIL) 20 MG tablet Take 1 tablet (20 mg total) by mouth daily. 90 tablet 3   lurasidone (LATUDA) 40 MG TABS tablet Take 1 tablet (40 mg total) by mouth daily with supper. 90 tablet 0   Magnesium 500 MG CAPS Take by mouth.     medroxyPROGESTERone (PROVERA) 5 MG tablet Take 1 tablet (5 mg total) by mouth 2 (two) times daily for 7 days, THEN 1 tablet (5 mg total) daily for 21 days. Use for twelve consecutive days every month while on estrogen therapy. 35 tablet 0   Melatonin 10 MG TABS Take 10 mg by mouth at bedtime as needed (sleep).     metFORMIN (GLUCOPHAGE) 1000 MG tablet TAKE ONE TABLET BY MOUTH TWICE DAILY  with meals 180 tablet 2   Multiple Vitamin (MULTI-VITAMIN) tablet Take 1 tablet by mouth daily.     MYRBETRIQ 50 MG TB24 tablet TAKE ONE TABLET BY MOUTH ONCE DAILY 90 tablet 0   naloxone (NARCAN) nasal spray 4 mg/0.1  mL ADMINISTER A SINGLE SPRAY IN ONE NOSTRIL UPON SIGNS OF OPIOID OVERDOSE. CALL 911. REPEAT AFTER 3 MINUTES IF NO RESPONSE.     nitrofurantoin, macrocrystal-monohydrate, (MACROBID) 100 MG capsule Take 1 capsule (100 mg total) by mouth 2 (two) times daily. 10 capsule 0   oxyCODONE ER (XTAMPZA ER) 13.5 MG C12A Take 1 tablet by mouth 2 (two) times daily.     pregabalin (LYRICA) 200 MG capsule Take 200 mg by mouth 2 (two) times daily.     promethazine (PHENERGAN) 25 MG tablet Take by mouth.     tacrolimus (PROGRAF) 1 MG capsule Take 1 capsule by mouth once daily (Patient taking differently: Take 1 mg by mouth 1 day or 1 dose.) 30 capsule 0   venlafaxine XR (EFFEXOR-XR) 37.5 MG 24 hr capsule TAKE ONE CAPSULE BY MOUTH ONCE DAILY WITH BREAKFAST 90 capsule 0   XYLIMELTS 550 MG DISK USE one BY MOUTH EVERY 8 HOURS AS NEEDED FOR DRY MOUTH     Current Facility-Administered Medications  Medication Dose Route Frequency Provider Last Rate Last Admin   sulfamethoxazole-trimethoprim (BACTRIM DS) 800-160 MG per tablet 1 tablet  1 tablet Oral Q12H Hollice Espy, MD   1 tablet at 01/17/21 1432    Review of Systems  Constitutional: Positive for fatigue and unexpected weight change. Negative for chills and fever.  HENT: Negative for trouble swallowing.  Eyes: Negative for loss of vision.  Respiratory: Negative for cough, shortness of breath and wheezing.  Cardiovascular: Negative for chest pain, leg swelling, palpitations and syncope.  GI: Negative for abdominal pain, blood in stool, diarrhea, nausea and vomiting.  GU: Negative for difficulty urinating, dysuria, frequency and hematuria.  Musculoskeletal: Negative for back pain, leg pain and joint pain.  Skin: Negative for rash.  Neurological:  Positive for dizziness. Negative for headaches, light-headedness, numbness and seizures.  Hematologic: Positive for bruises/bleeds easily.  Psychiatric: Positive for depressed mood. Negative for behavioral problem, confusion and sleep disturbance.       Objective:  Objective   Vitals:   10/16/21 1019  BP: 136/74  Weight: 243 lb 9.6 oz (110.5 kg)  Height: _0  (1.676 m)   Body mass index is 39.32 kg/m.  Physical Exam Vitals and nursing note reviewed. Exam conducted with a chaperone present.  Constitutional:      Appearance: Normal appearance. She is well-developed.  HENT:     Head: Normocephalic and atraumatic.  Eyes:     Extraocular Movements: Extraocular movements intact.     Pupils: Pupils are equal, round, and reactive to light.  Cardiovascular:     Rate and Rhythm: Normal rate and regular rhythm.  Pulmonary:     Effort: Pulmonary effort is normal. No respiratory distress.     Breath sounds: Normal breath sounds.  Abdominal:     General: Abdomen is flat.     Palpations: Abdomen is soft.  Genitourinary:    Comments: External: Normal appearing vulva. No lesions noted.  Speculum examination: Normal appearing cervix. Small blood in the vaginal vault.   Bimanual examination: Uterus midline, non-tender, normal in size, shape and contour.  No CMT. No adnexal masses. No adnexal tenderness. Pelvis not fixed.  Breast exam: exam not performed Musculoskeletal:        General: No signs of injury.  Skin:    General: Skin is warm and dry.  Neurological:     Mental Status: She is alert and oriented to person, place, and time.  Psychiatric:  Behavior: Behavior normal.        Thought Content: Thought content normal.        Judgment: Judgment normal.    Assessment/Plan:    46 year old with postmenopausal bleeding Patient is concerned and alarmed regarding the amount of bleeding.  Vital signs are stable today in office.  We will check a stat CBC.  Bleeding on exam is  small to moderate. Will start patient on Provera twice a day for 7 days followed by once a day treatment. Will obtain pelvic ultrasound and plan to have patient follow-up for a endometrial biopsy. Pap smear today  More than 30 minutes were spent face to face with the patient in the room, reviewing the medical record, labs and images, and coordinating care for the patient. The plan of management was discussed in detail and counseling was provided.    Adrian Prows MD [facility], [facility] 10/16/2021 11:06 AM

## 2021-10-17 ENCOUNTER — Ambulatory Visit (INDEPENDENT_AMBULATORY_CARE_PROVIDER_SITE_OTHER): Payer: Medicaid Other

## 2021-10-17 DIAGNOSIS — R072 Precordial pain: Secondary | ICD-10-CM

## 2021-10-17 LAB — ECHOCARDIOGRAM COMPLETE
AR max vel: 3.94 cm2
AV Area VTI: 3.6 cm2
AV Area mean vel: 3.34 cm2
AV Mean grad: 4 mmHg
AV Peak grad: 7.3 mmHg
Ao pk vel: 1.35 m/s
Area-P 1/2: 3.53 cm2
Calc EF: 36.1 %
S' Lateral: 4.2 cm
Single Plane A2C EF: 53.7 %
Single Plane A4C EF: -13.5 %

## 2021-10-17 MED ORDER — PERFLUTREN LIPID MICROSPHERE
1.0000 mL | INTRAVENOUS | Status: AC | PRN
  Administered 2021-10-17: 2 mL via INTRAVENOUS

## 2021-10-18 ENCOUNTER — Telehealth: Payer: Self-pay | Admitting: Oncology

## 2021-10-18 NOTE — Telephone Encounter (Signed)
Spoke with patient about PET scan scheduled on 1/30. Patient has has a PET scan previously but we reviewed prep instructions and arrival time. She was agreeable.

## 2021-10-19 ENCOUNTER — Other Ambulatory Visit: Payer: Self-pay

## 2021-10-19 ENCOUNTER — Ambulatory Visit: Payer: Medicaid Other | Admitting: Nurse Practitioner

## 2021-10-19 ENCOUNTER — Encounter: Payer: Self-pay | Admitting: Nurse Practitioner

## 2021-10-19 VITALS — BP 130/74 | HR 84 | Temp 98.3°F | Resp 16 | Ht 66.5 in | Wt 242.7 lb

## 2021-10-19 DIAGNOSIS — F3178 Bipolar disorder, in full remission, most recent episode mixed: Secondary | ICD-10-CM | POA: Diagnosis not present

## 2021-10-19 DIAGNOSIS — I10 Essential (primary) hypertension: Secondary | ICD-10-CM | POA: Diagnosis not present

## 2021-10-19 DIAGNOSIS — K118 Other diseases of salivary glands: Secondary | ICD-10-CM | POA: Diagnosis not present

## 2021-10-19 DIAGNOSIS — N939 Abnormal uterine and vaginal bleeding, unspecified: Secondary | ICD-10-CM

## 2021-10-19 DIAGNOSIS — E1165 Type 2 diabetes mellitus with hyperglycemia: Secondary | ICD-10-CM | POA: Diagnosis not present

## 2021-10-19 DIAGNOSIS — E785 Hyperlipidemia, unspecified: Secondary | ICD-10-CM

## 2021-10-19 DIAGNOSIS — G43109 Migraine with aura, not intractable, without status migrainosus: Secondary | ICD-10-CM

## 2021-10-19 DIAGNOSIS — R921 Mammographic calcification found on diagnostic imaging of breast: Secondary | ICD-10-CM

## 2021-10-19 LAB — CYTOLOGY - PAP
Comment: NEGATIVE
Diagnosis: NEGATIVE
Diagnosis: REACTIVE
High risk HPV: NEGATIVE

## 2021-10-19 MED ORDER — QULIPTA 30 MG PO TABS: 30.0000 mg | ORAL_TABLET | Freq: Every day | ORAL | 0 refills | Status: AC

## 2021-10-19 NOTE — Progress Notes (Addendum)
BP 130/74    Pulse 84    Temp 98.3 F (36.8 C) (Oral)    Resp 16    Ht 5' 6.5" (1.689 m)    Wt 242 lb 11.2 oz (110.1 kg)    LMP 02/04/2018 (Approximate) Comment: Per patient, over a year ago   SpO2 94%    BMI 38.59 kg/m    Subjective:    Patient ID: Alison [name], female    DOB: 08/24/1976, 46 y.o.   MRN: [id number]  HPI: Alison [name] is a 46 y.o. female  Chief Complaint  Patient presents with   Headache   Follow-up   Migraines: She says she has had migraines with aura since she was 12.  She says it seems to happen every two weeks.  She says she will have one on the right side of her head and then about two weeks later she will have one on the left. She was taking Imitrex and she cannot take that anymore. She says it helps if she lays down in a dark room with no sound. She says her migraines typically last 4 hours to all day. She has seen a neurologist, Dr. Manuella Ghazi and she sees him again in two months. Discussed trying qulipta.  She is agreeable with plan.   Abnormal vaginal bleeding: She is currently being seen by Dr. Gilman Schmidt in OB/GYN.  She is going to have a pelvic ultrasound done soon.  She last saw Dr. Gilman Schmidt on 10/16/2021.  Breast Calcification/ left parotid gland nodule:  She had a mammogram and it showed Left breast calcifications on 10/04/2021.  There is also a concern that she has right parotid gland nodule which was found on 09/26/2021 on a soft tissue ultrasound.  work up currently active with Dr. Janese Banks oncology.  She is scheduled for a PET scan on 11/05/2021.  Bipolar:  She receives mental health care from Dr. Shea Evans and counseling from the [facility].  She is currently taking Effexor 37.5 mg daily. She says she has an appointment with Dr. Shea Evans coming up.   HTN: Her blood pressure today is 130/74.  She does not regularly check her blood pressure at home.  She denies any chest pain, shortness of breath, headaches or blurred vision. She is currently taking lisinopril 20  mg daily.   Hyperlipidemia:  She is currently taking zetia 10 mg daily and vascepa 1 gram BID.  She denies any myalgia.  Her last LDL was 119 on 09/07/2021.  She is currently seeing cardiology, Dr. Mylo Red. Last visit on 09/07/2021.  He advised for her to stay on current treatment.   DM:  She is currently being treated by [facility], I have no access to the records.  She is currently taking metformin and trulicity. She denies any polyuria, polydipsia or polyphagia. [facility] office and they were closed, unable to get last A1C today.    Relevant past medical, surgical, family and social history reviewed and updated as indicated. Interim medical history since our last visit reviewed. Allergies and medications reviewed and updated.  Review of Systems  Constitutional: Negative for fever or weight change.  Respiratory: Negative for cough and shortness of breath.   Cardiovascular: Negative for chest pain or palpitations.  Gastrointestinal: Negative for abdominal pain, no bowel changes.  Musculoskeletal: Negative for gait problem or joint swelling.  Skin: Negative for rash.  Neurological: Negative for dizziness, positive for headache.  No other specific complaints in a complete review of systems (except as listed  in HPI above).      Objective:    BP 130/74    Pulse 84    Temp 98.3 F (36.8 C) (Oral)    Resp 16    Ht 5' 6.5" (1.689 m)    Wt 242 lb 11.2 oz (110.1 kg)    LMP 02/04/2018 (Approximate) Comment: Per patient, over a year ago   SpO2 94%    BMI 38.59 kg/m   Wt Readings from Last 3 Encounters:  10/19/21 242 lb 11.2 oz (110.1 kg)  10/16/21 243 lb 9.6 oz (110.5 kg)  09/07/21 246 lb (111.6 kg)    Physical Exam  Constitutional: Patient appears well-developed and well-nourished. Obese  No distress.  HEENT: head atraumatic, normocephalic, pupils equal and reactive to light,  neck supple Cardiovascular: Normal rate, regular rhythm and normal heart sounds.  No murmur heard. No BLE  edema. Pulmonary/Chest: Effort normal and breath sounds normal. No respiratory distress. Abdominal: Soft.  There is no tenderness. Psychiatric: Patient has a normal mood and affect. behavior is normal. Judgment and thought content normal.   Results for orders placed or performed in visit on 10/17/21  ECHOCARDIOGRAM COMPLETE  Result Value Ref Range   AR max vel 3.94 cm2   AV Peak grad 7.3 mmHg   Ao pk vel 1.35 m/s   S' Lateral 4.20 cm   Area-P 1/2 3.53 cm2   AV Area VTI 3.60 cm2   AV Mean grad 4.0 mmHg   Single Plane A4C EF -13.5 %   Single Plane A2C EF 53.7 %   Calc EF 36.1 %   AV Area mean vel 3.34 cm2      Assessment & Plan:   1. Migraine with aura and without status migrainosus, not intractable  - Atogepant (QULIPTA) 30 MG TABS; Take 30 mg by mouth daily.  Dispense: 30 tablet; Refill: 0  2. Vaginal bleeding -continue with current plan of care with OB/GYN  3. Breast calcifications -continue with current plan of care  4. Parotid nodule -continue with current plan of care  5. Bipolar disorder, in full remission, most recent episode [facility]) -continue current treatment plan and keep appointment with Dr. Shea Evans  6. Essential hypertension -continue current treatment  7. Hyperlipidemia, unspecified hyperlipidemia type -continue current treatment  8. Type 2 diabetes mellitus with hyperglycemia, without long-term current use of insulin ([place]) -continue current treatment  Follow up plan: Return in about 4 weeks (around 11/16/2021) for follow up.

## 2021-10-20 ENCOUNTER — Encounter: Payer: Self-pay | Admitting: Nurse Practitioner

## 2021-10-23 ENCOUNTER — Other Ambulatory Visit: Payer: Self-pay

## 2021-10-24 ENCOUNTER — Telehealth (INDEPENDENT_AMBULATORY_CARE_PROVIDER_SITE_OTHER): Payer: Medicaid Other | Admitting: Psychiatry

## 2021-10-24 ENCOUNTER — Encounter: Payer: Self-pay | Admitting: Psychiatry

## 2021-10-24 ENCOUNTER — Other Ambulatory Visit: Payer: Self-pay

## 2021-10-24 DIAGNOSIS — Z9189 Other specified personal risk factors, not elsewhere classified: Secondary | ICD-10-CM

## 2021-10-24 DIAGNOSIS — F4310 Post-traumatic stress disorder, unspecified: Secondary | ICD-10-CM | POA: Diagnosis not present

## 2021-10-24 DIAGNOSIS — F3161 Bipolar disorder, current episode mixed, mild: Secondary | ICD-10-CM

## 2021-10-24 DIAGNOSIS — F411 Generalized anxiety disorder: Secondary | ICD-10-CM | POA: Diagnosis not present

## 2021-10-24 DIAGNOSIS — G2571 Drug induced akathisia: Secondary | ICD-10-CM

## 2021-10-24 DIAGNOSIS — G4701 Insomnia due to medical condition: Secondary | ICD-10-CM | POA: Diagnosis not present

## 2021-10-24 MED ORDER — LURASIDONE HCL 40 MG PO TABS: 20.0000 mg | ORAL_TABLET | Freq: Every day | ORAL | 0 refills | Status: DC

## 2021-10-24 NOTE — Progress Notes (Signed)
Virtual Visit via Video Note  I connected with Alison [name] on 10/24/21 at  4:00 PM EST by a video enabled telemedicine application and verified that I am speaking with the correct person using two identifiers.  Location Provider Location : [facility] Patient Location : Car  Participants: Patient , Provider   I discussed the limitations of evaluation and management by telemedicine and the availability of in person appointments. The patient expressed understanding and agreed to proceed.   I discussed the assessment and treatment plan with the patient. The patient was provided an opportunity to ask questions and all were answered. The patient agreed with the plan and demonstrated an understanding of the instructions.   The patient was advised to call back or seek an in-person evaluation if the symptoms worsen or if the condition fails to improve as anticipated.   [facility] MD OP Progress Note  10/24/2021 5:48 PM Alison [name]  MRN:  [id number]  Chief Complaint:  Chief Complaint   Follow-up; Anxiety; Manic Behavior, 46 year old Caucasian female who has a history of multiple medical problems, bipolar disorder, PTSD, sleep problems, presents for a follow-up appointment for medication management as well as possible adverse side effects to medications.    HPI: Alison [name] is a 46 year old Caucasian female, lives in [place], has a history of bipolar disorder, PTSD, multiple medical problems including fibromyalgia, chronic pain, trigeminal neuralgia, history of liver transplant, B-cell lymphoma, SLE, diabetes melitis, OSA, migraine headache was evaluated by telemedicine today.  Patient today reports she has been restless recently especially at night.  This has been going on since the past 1 week or so.  She reports she has this urge to move around and has been unable to sit or lay still at night.  She reports she has not noticed this during the day likely because she has been moving around a  lot anyway.  Patient reports she has been compliant on the [place].  Unknown if this is a side effect of the [place].  Denies any other recent medication readjustments.  Patient reports Latuda at this dosage is beneficial.  However she does have multiple situational stressors which are anxiety provoking.  Recently had a mammogram and has to go for biopsy since it was abnormal.  She is also awaiting a PET scan due to history of parotid mass.  Patient reports recent hypomanic symptoms however reports that has improved.  Denies any significant depression.  Reports compliance with medications.  Denies suicidality, homicidality or perceptual disturbances.  Denies any other concerns today.  Visit Diagnosis:    ICD-10-CM   1. Bipolar 1 disorder, mixed, mild (HCC)  F31.61 lurasidone (LATUDA) 40 MG TABS tablet   improving    2. PTSD (post-traumatic stress disorder)  F43.10     3. GAD (generalized anxiety disorder)  F41.1     4. Insomnia due to medical condition  G47.01    mood, restlessness    5. Akathisia  G25.71     6. At risk for prolonged QT interval syndrome  Z91.89       Past Psychiatric History: Reviewed past psychiatric history from progress note on 06/10/2019.  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   Chemo tx's   Migraine    Morbid obesity (HCC)    Neuromuscular disorder (HCC)     neuropathy   Neuropathy    Personality disorder ([place])    Post herpetic neuralgia    PTSD (post-traumatic stress disorder)    Renal disorder    S/P liver transplant [facility])     Past Surgical History:  Procedure Laterality Date   BONE MARROW BIOPSY  01/13/2015   BREAST BIOPSY  12/2014   BREAST BIOPSY  2011   BREAST SURGERY     CHOLECYSTECTOMY     COLONOSCOPY     ESOPHAGOGASTRODUODENOSCOPY     ESOPHAGOGASTRODUODENOSCOPY (EGD) WITH PROPOFOL N/A 01/09/2021   Procedure: ESOPHAGOGASTRODUODENOSCOPY (EGD) WITH PROPOFOL;  Surgeon: [name], MD;  Location: [facility] ENDOSCOPY;  Service: Endoscopy;  Laterality: N/A;   HERNIA REPAIR     infusaport     LIVER TRANSPLANT  12/17/1991   LUMBAR PUNCTURE     PORT A CATH INJECTION ([place] HX)     tumor removal  2015    Family Psychiatric History: Reviewed family psychiatric history from progress note on 06/10/2019.  Family History:  Family History  Problem Relation Age of Onset   Heart disease Mother    Hypertension Mother    Cancer - Other Mother    Bipolar disorder Mother    Heart disease Father    Hypertension Father    Diabetes Father    Parkinson's disease Maternal Grandmother    Cancer Maternal Aunt    Cancer Maternal Uncle    Cancer Maternal Grandfather    Lupus Paternal Grandmother    Hypertension Brother     Social History: Reviewed social history from progress note on 06/10/2019. Social History   Socioeconomic History   Marital status: Single    Spouse name: Not on file   Number of children: 0   Years of education: 9   Highest education level: GED or equivalent  Occupational History   Occupation: disabled  Tobacco Use   Smoking status: Never   Smokeless tobacco: Never  Vaping Use   Vaping Use: Never used  Substance and Sexual Activity   Alcohol use: Not Currently   Drug use: Not Currently    Types: Marijuana    Comment: pain managment last used in early april   Sexual activity: Not Currently  Other Topics Concern    Not on file  Social History Narrative   Not on file   Social Determinants of Health   Financial Resource Strain: Low Risk    Difficulty of Paying Living Expenses: Not hard at all  Food Insecurity: No Food Insecurity   Worried About Charity fundraiser in the Last Year: Never true   [place] in the Last Year: Never true  Transportation Needs: No Transportation Needs   Lack of Transportation (Medical): No   Lack of Transportation (Non-Medical): No  Physical Activity: Insufficiently Active   Days of Exercise per Week: 1 day   Minutes of Exercise per Session: 30 min  Stress: No Stress Concern  Present   Feeling of Stress : Only a little  Social Connections: Moderately Integrated   Frequency of Communication with Friends and Family: More than three times a week   Frequency of Social Gatherings with Friends and Family: More than three times a week   Attends Religious Services: More than 4 times per year   Active Member of [employer] or Organizations: No   Attends Archivist Meetings: Never   Marital Status: Living with partner    Allergies:  Allergies  Allergen Reactions   Penicillin G Itching, Rash, Anaphylaxis and Palpitations   Lamictal [Lamotrigine] Rash   Carbamazepine Other (See Comments)    Medication interaction-prograf   Hydrocodone-Acetaminophen Itching   Naproxen Itching    Metabolic Disorder Labs: Lab Results  Component Value Date   HGBA1C 8.9 (A) 12/11/2020   MPG 209 06/05/2020   Lab Results  Component Value Date   PROLACTIN 13.4 07/15/2019   PROLACTIN 21.3 05/24/2019   Lab Results  Component Value Date   CHOL 198 09/07/2021   TRIG 229 (H) 09/07/2021   HDL 39 (L) 09/07/2021   CHOLHDL 5.1 (H) 09/07/2021   LDLCALC 119 (H) 09/07/2021   LDLCALC 133 (H) 12/11/2020   Lab Results  Component Value Date   TSH 2.650 07/15/2019   TSH 4.650 (H) 05/24/2019    Therapeutic Level Labs: No results found for: LITHIUM No results found for:  VALPROATE No components found for:  CBMZ  Current Medications: Current Outpatient Medications  Medication Sig Dispense Refill   Atogepant (QULIPTA) 30 MG TABS Take 30 mg by mouth daily. (Patient not taking: Reported on 10/23/2021) 30 tablet 0   baclofen (LIORESAL) 10 MG tablet Take 10 mg by mouth 2 (two) times daily.     benztropine (COGENTIN) 1 MG tablet Take 1 tablet (1 mg total) by mouth daily as needed for tremors. 30 tablet 1   Cranberry 500 MG CAPS Take by mouth.     Dulaglutide (TRULICITY) 3 XV/8.5BM SOPN Inject 3 mg into the skin once a week.     ergocalciferol (VITAMIN D2) 1.25 MG (50000 UT) capsule Take 1 capsule by mouth once a week.     eszopiclone (LUNESTA) 1 MG TABS tablet Take 1 tablet (1 mg total) by mouth at bedtime as needed for sleep. Take immediately before bedtime 30 tablet 1   ezetimibe (ZETIA) 10 MG tablet Take 10 mg by mouth daily.     glucose blood (COOL BLOOD GLUCOSE TEST STRIPS) test strip Use as instructed 100 each 12   icosapent Ethyl (VASCEPA) 1 g capsule Take 2 capsules (2 g total) by mouth 2 (two) times daily. 120 capsule 5   lisinopril (ZESTRIL) 20 MG tablet Take 1 tablet (20 mg total) by mouth daily. 90 tablet 3   lurasidone (LATUDA) 40 MG TABS tablet Take 0.5 tablets (20 mg total) by mouth daily with supper. 90 tablet 0   Magnesium 500 MG CAPS Take by mouth.     medroxyPROGESTERone (PROVERA) 5 MG tablet Take 1 tablet (5 mg total) by mouth 2 (two) times daily for 7 days, THEN 1 tablet (5 mg total) daily for 21 days. Use for twelve consecutive days every month while on estrogen therapy. 35 tablet 0   Melatonin 10 MG TABS Take 10 mg by mouth at bedtime as needed (sleep).     metFORMIN (GLUCOPHAGE) 1000 MG tablet TAKE ONE TABLET BY MOUTH TWICE DAILY with meals 180 tablet 2   Multiple Vitamin (MULTI-VITAMIN) tablet Take 1 tablet  by mouth daily.     MYRBETRIQ 50 MG TB24 tablet TAKE ONE TABLET BY MOUTH ONCE DAILY 90 tablet 0   naloxone (NARCAN) nasal spray 4 mg/0.1  mL ADMINISTER A SINGLE SPRAY IN ONE NOSTRIL UPON SIGNS OF OPIOID OVERDOSE. CALL 911. REPEAT AFTER 3 MINUTES IF NO RESPONSE. (Patient not taking: Reported on 10/23/2021)     oxyCODONE ER (XTAMPZA ER) 13.5 MG C12A Take 1 tablet by mouth 2 (two) times daily.     pregabalin (LYRICA) 200 MG capsule Take 200 mg by mouth 2 (two) times daily.     promethazine (PHENERGAN) 25 MG tablet Take by mouth.     tacrolimus (PROGRAF) 1 MG capsule Take 1 capsule by mouth once daily (Patient taking differently: Take 1 mg by mouth 1 day or 1 dose.) 30 capsule 0   venlafaxine XR (EFFEXOR-XR) 37.5 MG 24 hr capsule TAKE ONE CAPSULE BY MOUTH ONCE DAILY WITH BREAKFAST 90 capsule 0   XYLIMELTS 550 MG DISK USE one BY MOUTH EVERY 8 HOURS AS NEEDED FOR DRY MOUTH     Current Facility-Administered Medications  Medication Dose Route Frequency Provider Last Rate Last Admin   sulfamethoxazole-trimethoprim (BACTRIM DS) 800-160 MG per tablet 1 tablet  1 tablet Oral Q12H Hollice Espy, MD   1 tablet at 01/17/21 1432     Musculoskeletal: Strength & Muscle Tone:  UTA Gait & Station:  Seated Patient leans: N/A  Psychiatric Specialty Exam: Review of Systems  Musculoskeletal:        Restlessness  Psychiatric/Behavioral:  Positive for sleep disturbance. The patient is nervous/anxious.   All other systems reviewed and are negative.  Last menstrual period 02/04/2018.There is no height or weight on file to calculate BMI.  General Appearance: Casual  Eye Contact:  Fair  Speech:  Clear and Coherent  Volume:  Normal  Mood:  Anxious  Affect:  Congruent  Thought Process:  Goal Directed and Descriptions of Associations: Intact  Orientation:  Full (Time, Place, and Person)  Thought Content: Logical   Suicidal Thoughts:  No  Homicidal Thoughts:  No  Memory:  Immediate;   Fair Recent;   Fair Remote;   Fair  Judgement:  Fair  Insight:  Fair  Psychomotor Activity:  Restlessness  Concentration:  Concentration: Fair and Attention  Span: Fair  Recall:  [employer] of Knowledge: Fair  Language: Fair  Akathisia:  Yes  Handed:  Right  AIMS (if indicated): done  Assets:  Communication Skills Desire for Improvement Housing Social Support  ADL's:  Intact  Cognition: WNL  Sleep:  Restless   Screenings: AIMS    Flowsheet Row Video Visit from 05/09/2021 in [place] Total Score 0      [place] Video Visit from 10/24/2021 in [place] Office Visit from 10/19/2021 in [facility] Office Visit from 08/29/2021 in [facility] Video Visit from 05/28/2021 in [place] Video Visit from 05/09/2021 in [place]  Total GAD-7 Score 9 20 15 9 9       PHQ2-9    Flowsheet Row Video Visit from 10/24/2021 in [place] Office Visit from 10/19/2021 in [facility] Office Visit from 09/12/2021 in [facility] Visit from 09/04/2021 in [place] Visit from 08/29/2021 in [facility]  PHQ-2 Total Score 0 4 2 2 3   PHQ-9 Total Score -- 19  8 13 19       Flowsheet Row Office Visit from 09/12/2021 in [place] Office Visit from 09/04/2021 in [place] Office Visit from 07/20/2021 in [place] No Risk No Risk No Risk        Assessment and Plan: Alison [name] is a 46 year old Caucasian female on disability, has a history of bipolar disorder, PTSD, history of borderline personality disorder, chronic pain, fibromyalgia, SLE, diabetes, OSA, migraine headaches, recent right sided swelling of the face this patient for parotid adenoma, was evaluated by telemedicine today.  Patient likely with adverse side effects to  [facility], with some improvement of her mood symptoms, will continue to need medication management.  Plan Bipolar disorder type I mixed mild-improving We will reduce Latuda to 20 mg p.o. daily with supper due to side effects Cogentin 1 mg p.o. daily as needed for side effects of Latuda Venlafaxine extended release 37.5 mg p.o. daily  GAD-improving Venlafaxine extended release 37.5 mg p.o. daily Xanax 0.25 mg as needed for anxiety attacks Continue CBT.    PTSD-stable Venlafaxine extended release 37.5 mg p.o. daily  Insomnia-unstable We will reduce Latuda to address restlessness. Continue Lunesta 1 mg p.o. nightly Continue sleep hygiene techniques Sleep study completed-pending report  Akathisia-unstable We will reduce Latuda to 20 mg p.o. daily We will reevaluate patient in a few days.  At risk for prolonged QT syndrome-EKG pending.  Encouraged compliance.  Follow-up in clinic in 7 to 10 days or sooner if needed.  This note was generated in part or whole with voice recognition software. Voice recognition is usually quite accurate but there are transcription errors that can and very often do occur. I apologize for any typographical errors that were not detected and corrected.       [name], MD 10/24/2021, 5:48 PM

## 2021-10-25 ENCOUNTER — Other Ambulatory Visit: Payer: Self-pay | Admitting: Family Medicine

## 2021-10-25 ENCOUNTER — Ambulatory Visit: Payer: Medicaid Other

## 2021-10-25 ENCOUNTER — Ambulatory Visit
Admission: RE | Admit: 2021-10-25 | Discharge: 2021-10-25 | Disposition: A | Discharge status: Home / Self Care | Payer: Medicaid Other | Source: Ambulatory Visit | Attending: Family Medicine | Admitting: Family Medicine

## 2021-10-25 ENCOUNTER — Encounter: Payer: Self-pay | Admitting: Oncology

## 2021-10-25 DIAGNOSIS — R922 Inconclusive mammogram: Secondary | ICD-10-CM | POA: Diagnosis not present

## 2021-10-25 DIAGNOSIS — R928 Other abnormal and inconclusive findings on diagnostic imaging of breast: Secondary | ICD-10-CM | POA: Diagnosis not present

## 2021-10-25 DIAGNOSIS — R921 Mammographic calcification found on diagnostic imaging of breast: Secondary | ICD-10-CM | POA: Diagnosis not present

## 2021-10-26 ENCOUNTER — Ambulatory Visit: Payer: Medicaid Other | Admitting: Obstetrics and Gynecology

## 2021-10-30 DIAGNOSIS — F411 Generalized anxiety disorder: Secondary | ICD-10-CM | POA: Diagnosis not present

## 2021-10-30 DIAGNOSIS — Z03818 Encounter for observation for suspected exposure to other biological agents ruled out: Secondary | ICD-10-CM | POA: Diagnosis not present

## 2021-10-30 DIAGNOSIS — F3181 Bipolar II disorder: Secondary | ICD-10-CM | POA: Diagnosis not present

## 2021-10-30 DIAGNOSIS — R0981 Nasal congestion: Secondary | ICD-10-CM | POA: Diagnosis not present

## 2021-10-30 DIAGNOSIS — F4310 Post-traumatic stress disorder, unspecified: Secondary | ICD-10-CM | POA: Diagnosis not present

## 2021-10-31 ENCOUNTER — Ambulatory Visit
Admission: RE | Admit: 2021-10-31 | Discharge: 2021-10-31 | Disposition: A | Discharge status: Home / Self Care | Payer: Medicaid Other | Source: Ambulatory Visit | Attending: Obstetrics and Gynecology | Admitting: Obstetrics and Gynecology

## 2021-10-31 ENCOUNTER — Other Ambulatory Visit: Payer: Self-pay | Admitting: Psychiatry

## 2021-10-31 ENCOUNTER — Other Ambulatory Visit: Payer: Self-pay

## 2021-10-31 ENCOUNTER — Encounter: Payer: Self-pay | Admitting: Obstetrics and Gynecology

## 2021-10-31 DIAGNOSIS — F411 Generalized anxiety disorder: Secondary | ICD-10-CM

## 2021-10-31 DIAGNOSIS — F4310 Post-traumatic stress disorder, unspecified: Secondary | ICD-10-CM

## 2021-10-31 DIAGNOSIS — N950 Postmenopausal bleeding: Secondary | ICD-10-CM | POA: Insufficient documentation

## 2021-10-31 DIAGNOSIS — D259 Leiomyoma of uterus, unspecified: Secondary | ICD-10-CM | POA: Diagnosis not present

## 2021-11-01 ENCOUNTER — Ambulatory Visit
Admission: RE | Admit: 2021-11-01 | Discharge: 2021-11-01 | Disposition: A | Discharge status: Home / Self Care | Payer: Medicaid Other | Source: Ambulatory Visit | Attending: Family Medicine | Admitting: Family Medicine

## 2021-11-01 DIAGNOSIS — R921 Mammographic calcification found on diagnostic imaging of breast: Secondary | ICD-10-CM

## 2021-11-05 ENCOUNTER — Other Ambulatory Visit: Payer: Self-pay

## 2021-11-05 ENCOUNTER — Ambulatory Visit (INDEPENDENT_AMBULATORY_CARE_PROVIDER_SITE_OTHER): Payer: Medicaid Other | Admitting: Psychiatry

## 2021-11-05 ENCOUNTER — Ambulatory Visit
Admission: RE | Admit: 2021-11-05 | Discharge: 2021-11-05 | Disposition: A | Discharge status: Home / Self Care | Payer: Medicaid Other | Source: Ambulatory Visit | Attending: Oncology | Admitting: Oncology

## 2021-11-05 ENCOUNTER — Encounter: Payer: Self-pay | Admitting: Psychiatry

## 2021-11-05 VITALS — BP 121/81 | HR 77 | Temp 98.1°F | Wt 244.8 lb

## 2021-11-05 DIAGNOSIS — C8590 Non-Hodgkin lymphoma, unspecified, unspecified site: Secondary | ICD-10-CM | POA: Insufficient documentation

## 2021-11-05 DIAGNOSIS — G2571 Drug induced akathisia: Secondary | ICD-10-CM

## 2021-11-05 DIAGNOSIS — G4701 Insomnia due to medical condition: Secondary | ICD-10-CM | POA: Diagnosis not present

## 2021-11-05 DIAGNOSIS — Z9189 Other specified personal risk factors, not elsewhere classified: Secondary | ICD-10-CM | POA: Diagnosis not present

## 2021-11-05 DIAGNOSIS — F3178 Bipolar disorder, in full remission, most recent episode mixed: Secondary | ICD-10-CM | POA: Diagnosis not present

## 2021-11-05 DIAGNOSIS — Z08 Encounter for follow-up examination after completed treatment for malignant neoplasm: Secondary | ICD-10-CM | POA: Diagnosis not present

## 2021-11-05 DIAGNOSIS — C8580 Other specified types of non-Hodgkin lymphoma, unspecified site: Secondary | ICD-10-CM

## 2021-11-05 DIAGNOSIS — F411 Generalized anxiety disorder: Secondary | ICD-10-CM | POA: Diagnosis not present

## 2021-11-05 DIAGNOSIS — I700 Atherosclerosis of aorta: Secondary | ICD-10-CM | POA: Diagnosis not present

## 2021-11-05 DIAGNOSIS — F4310 Post-traumatic stress disorder, unspecified: Secondary | ICD-10-CM | POA: Diagnosis not present

## 2021-11-05 DIAGNOSIS — F3161 Bipolar disorder, current episode mixed, mild: Secondary | ICD-10-CM

## 2021-11-05 DIAGNOSIS — K439 Ventral hernia without obstruction or gangrene: Secondary | ICD-10-CM | POA: Diagnosis not present

## 2021-11-05 DIAGNOSIS — C884 Extranodal marginal zone B-cell lymphoma of mucosa-associated lymphoid tissue [MALT-lymphoma]: Secondary | ICD-10-CM | POA: Diagnosis not present

## 2021-11-05 DIAGNOSIS — K753 Granulomatous hepatitis, not elsewhere classified: Secondary | ICD-10-CM | POA: Diagnosis not present

## 2021-11-05 LAB — GLUCOSE, CAPILLARY: Glucose-Capillary: 152 mg/dL — ABNORMAL HIGH (ref 70–99)

## 2021-11-05 MED ORDER — LURASIDONE HCL 20 MG PO TABS: 20.0000 mg | ORAL_TABLET | Freq: Every day | ORAL | 0 refills | Status: DC

## 2021-11-05 MED ORDER — FLUDEOXYGLUCOSE F - 18 (FDG) INJECTION
12.5000 | Freq: Once | INTRAVENOUS | Status: AC
  Administered 2021-11-05: 13 via INTRAVENOUS

## 2021-11-05 NOTE — Progress Notes (Signed)
[facility] MD OP Progress Note  11/05/2021 4:38 PM ASJA FROMMER  MRN:  [id number]  Chief Complaint:  Chief Complaint   Follow-up 46 year old Caucasian female who has a history of bipolar disorder, PTSD, sleep problems, multiple medical problems presents for follow-up visit after recent adverse side effect of medications.    HPI: Alison [name] is a 46 year old Caucasian female, lives in [place], has a history of bipolar disorder, PTSD, multiple medical problems including fibromyalgia, chronic pain, trigeminal neuralgia, history of liver transplant, B-cell lymphoma, SLE, diabetes melitis, OSA, migraine headache was evaluated in office today.  Patient today reports since reducing the dosage of Latuda her restlessness has improved.  She is currently tolerating this dosage.  Reports mood symptoms are currently stable.  Patient reports she had a PET scan this morning to determine if she has cancer and that is still pending.  She is worried about that although coping well.  Patient reports her breast biopsy came back negative and she is happy about that.  Reports sleep is overall okay.  Denies any suicidality, homicidality or perceptual disturbances.  Patient denies any other concerns today.  Visit Diagnosis:    ICD-10-CM   1. Bipolar disorder, in full remission, most recent episode mixed (HCC)  F31.78 lurasidone (LATUDA) 20 MG TABS tablet    2. PTSD (post-traumatic stress disorder)  F43.10     3. GAD (generalized anxiety disorder)  F41.1     4. Insomnia due to medical condition  G47.01    mood, restlessness    5. At risk for prolonged QT interval syndrome  Z91.89 EKG 12-Lead    6. Akathisia  G25.71       Past Psychiatric History: Reviewed past psychiatric history from progress note on 06/10/2019.  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   Chemo tx's   Migraine    Morbid obesity (HCC)    Neuromuscular disorder (HCC)    neuropathy   Neuropathy    Personality disorder ([place])    Post herpetic neuralgia    PTSD (post-traumatic stress disorder)    Renal disorder    S/P liver transplant [facility])     Past Surgical History:  Procedure Laterality Date   BONE MARROW BIOPSY  01/13/2015   BREAST BIOPSY  12/2014   BREAST BIOPSY  2011   BREAST SURGERY     CHOLECYSTECTOMY     COLONOSCOPY     ESOPHAGOGASTRODUODENOSCOPY     ESOPHAGOGASTRODUODENOSCOPY (EGD) WITH PROPOFOL N/A 01/09/2021   Procedure: ESOPHAGOGASTRODUODENOSCOPY (EGD) WITH PROPOFOL;  Surgeon: [name], MD;  Location: [facility] ENDOSCOPY;  Service: Endoscopy;  Laterality: N/A;   HERNIA REPAIR     infusaport     LIVER TRANSPLANT  12/17/1991   LUMBAR PUNCTURE  PORT A CATH INJECTION ([place] HX)     tumor removal  2015    Family Psychiatric History: Reviewed family psychiatric history from progress note on 06/10/2019.  Family History:  Family History  Problem Relation Age of Onset   Heart disease Mother    Hypertension Mother    Cancer - Other Mother    Bipolar disorder Mother    Heart disease Father    Hypertension Father    Diabetes Father    Parkinson's disease Maternal Grandmother    Cancer Maternal Aunt    Cancer Maternal Uncle    Cancer Maternal Grandfather    Lupus Paternal Grandmother    Hypertension Brother     Social History: Reviewed social history from progress note on 06/10/2019. Social  History   Socioeconomic History   Marital status: Single    Spouse name: Not on file   Number of children: 0   Years of education: 9   Highest education level: GED or equivalent  Occupational History   Occupation: disabled  Tobacco Use   Smoking status: Never   Smokeless tobacco: Never  Vaping Use   Vaping Use: Never used  Substance and Sexual Activity   Alcohol use: Not Currently   Drug use: Not Currently    Types: Marijuana    Comment: pain managment last used in early april   Sexual activity: Not Currently  Other Topics Concern   Not on file  Social History Narrative   Not on file   Social Determinants of Health   Financial Resource Strain: Low Risk    Difficulty of Paying Living Expenses: Not hard at all  Food Insecurity: No Food Insecurity   Worried About Charity fundraiser in the Last Year: Never true   [place] in the Last Year: Never true  Transportation Needs: No Transportation Needs   Lack of Transportation (Medical): No   Lack of Transportation (Non-Medical): No  Physical Activity: Insufficiently Active   Days of Exercise per Week: 1 day   Minutes of Exercise per Session: 30 min  Stress: No Stress Concern Present   Feeling of Stress : Only a little  Social Connections: Moderately Integrated   Frequency of Communication with Friends and Family: More than three times a week   Frequency of Social Gatherings with Friends and Family: More than three times a week   Attends Religious Services: More than 4 times per year   Active Member of [employer] or Organizations: No   Attends Archivist Meetings: Never   Marital Status: Living with partner    Allergies:  Allergies  Allergen Reactions   Penicillin G Itching, Rash, Anaphylaxis and Palpitations   Lamictal [Lamotrigine] Rash   Carbamazepine Other (See Comments)    Medication interaction-prograf med interaction med interaction med interaction Medication interaction-prograf med  interaction med interaction Medication interaction-prograf   Hydrocodone-Acetaminophen Itching   Naproxen Itching    Metabolic Disorder Labs: Lab Results  Component Value Date   HGBA1C 8.9 (A) 12/11/2020   MPG 209 06/05/2020   Lab Results  Component Value Date   PROLACTIN 13.4 07/15/2019   PROLACTIN 21.3 05/24/2019   Lab Results  Component Value Date   CHOL 198 09/07/2021   TRIG 229 (H) 09/07/2021   HDL 39 (L) 09/07/2021   CHOLHDL 5.1 (H) 09/07/2021   LDLCALC 119 (H) 09/07/2021   LDLCALC 133 (H) 12/11/2020   Lab Results  Component Value Date   TSH 2.650 07/15/2019   TSH 4.650 (  H) 05/24/2019    Therapeutic Level Labs: No results found for: LITHIUM No results found for: VALPROATE No components found for:  CBMZ  Current Medications: Current Outpatient Medications  Medication Sig Dispense Refill   Atogepant (QULIPTA) 30 MG TABS Take 30 mg by mouth daily. 30 tablet 0   baclofen (LIORESAL) 10 MG tablet Take 10 mg by mouth 2 (two) times daily.     benztropine (COGENTIN) 1 MG tablet Take 1 tablet (1 mg total) by mouth daily as needed for tremors. 30 tablet 1   Cranberry 500 MG CAPS Take by mouth.     Dulaglutide (TRULICITY) 3 DS/2.8JG SOPN Inject 3 mg into the skin once a week.     ergocalciferol (VITAMIN D2) 1.25 MG (50000 UT) capsule Take 1 capsule by mouth once a week.     eszopiclone (LUNESTA) 1 MG TABS tablet Take 1 tablet (1 mg total) by mouth at bedtime as needed for sleep. Take immediately before bedtime 30 tablet 1   ezetimibe (ZETIA) 10 MG tablet Take 10 mg by mouth daily.     fluticasone (FLONASE) 50 MCG/ACT nasal spray Place into the nose.     glucose blood (COOL BLOOD GLUCOSE TEST STRIPS) test strip Use as instructed 100 each 12   icosapent Ethyl (VASCEPA) 1 g capsule Take 2 capsules (2 g total) by mouth 2 (two) times daily. 120 capsule 5   lisinopril (ZESTRIL) 20 MG tablet Take 1 tablet (20 mg total) by mouth daily. 90 tablet 3   lurasidone (LATUDA) 20 MG  TABS tablet Take 1 tablet (20 mg total) by mouth daily with supper. 90 tablet 0   Magnesium 500 MG CAPS Take by mouth.     medroxyPROGESTERone (PROVERA) 5 MG tablet Take 1 tablet (5 mg total) by mouth 2 (two) times daily for 7 days, THEN 1 tablet (5 mg total) daily for 21 days. Use for twelve consecutive days every month while on estrogen therapy. 35 tablet 0   Melatonin 10 MG TABS Take 10 mg by mouth at bedtime as needed (sleep).     metFORMIN (GLUCOPHAGE) 1000 MG tablet TAKE ONE TABLET BY MOUTH TWICE DAILY with meals 180 tablet 2   Multiple Vitamin (MULTI-VITAMIN) tablet Take 1 tablet by mouth daily.     MYRBETRIQ 50 MG TB24 tablet TAKE ONE TABLET BY MOUTH ONCE DAILY 90 tablet 0   naloxone (NARCAN) nasal spray 4 mg/0.1 mL      oxyCODONE ER (XTAMPZA ER) 13.5 MG C12A Take 1 tablet by mouth 2 (two) times daily.     pregabalin (LYRICA) 200 MG capsule Take 200 mg by mouth 2 (two) times daily.     tacrolimus (PROGRAF) 1 MG capsule Take 1 capsule by mouth once daily (Patient taking differently: Take 1 mg by mouth 1 day or 1 dose.) 30 capsule 0   venlafaxine XR (EFFEXOR-XR) 37.5 MG 24 hr capsule TAKE ONE CAPSULE BY MOUTH ONCE DAILY WITH BREAKFAST 90 capsule 0   XYLIMELTS 550 MG DISK USE one BY MOUTH EVERY 8 HOURS AS NEEDED FOR DRY MOUTH     Current Facility-Administered Medications  Medication Dose Route Frequency Provider Last Rate Last Admin   sulfamethoxazole-trimethoprim (BACTRIM DS) 800-160 MG per tablet 1 tablet  1 tablet Oral Q12H Hollice Espy, MD   1 tablet at 01/17/21 1432     Musculoskeletal: Strength & Muscle Tone: within normal limits Gait & Station: normal Patient leans: N/A  Psychiatric Specialty Exam: Review of Systems  Psychiatric/Behavioral:  The patient  is nervous/anxious.   All other systems reviewed and are negative.  Blood pressure 121/81, pulse 77, temperature 98.1 F (36.7 C), temperature source Temporal, weight 244 lb 12.8 oz (111 kg), last menstrual period  10/16/2021.Body mass index is 38.92 kg/m.  General Appearance: Casual  Eye Contact:  Fair  Speech:  Clear and Coherent  Volume:  Normal  Mood:  Anxious  Affect:  Congruent  Thought Process:  Goal Directed and Descriptions of Associations: Intact  Orientation:  Full (Time, Place, and Person)  Thought Content: Logical   Suicidal Thoughts:  No  Homicidal Thoughts:  No  Memory:  Immediate;   Fair Recent;   Fair Remote;   Fair  Judgement:  Fair  Insight:  Fair  Psychomotor Activity:  Normal  Concentration:  Concentration: Fair and Attention Span: Fair  Recall:  [employer] of Knowledge: Fair  Language: Fair  Akathisia:  No  Handed:  Right  AIMS (if indicated): done, 0  Assets:  Communication Skills Desire for Improvement Housing Social Support Talents/Skills  ADL's:  Intact  Cognition: WNL  Sleep:   Improving   Screenings: AIMS    Flowsheet Row Video Visit from 05/09/2021 in [place] Total Score 0      GAD-7    Flowsheet Row Video Visit from 10/24/2021 in [place] Office Visit from 10/19/2021 in [facility] Office Visit from 08/29/2021 in [facility] Video Visit from 05/28/2021 in [place] Video Visit from 05/09/2021 in [place]  Total GAD-7 Score 9 20 15 9 9       PHQ2-9    [place] Office Visit from 11/05/2021 in [place] Video Visit from 10/24/2021 in [place] Office Visit from 10/19/2021 in [facility] Office Visit from 09/12/2021 in [place] Visit from 09/04/2021 in [place]  PHQ-2 Total Score 2 0 4 2 2   PHQ-9 Total Score 7 -- 19 8 13       [place] Office Visit from 11/05/2021 in [place] Office Visit from  09/12/2021 in [place] Office Visit from 09/04/2021 in [place] No Risk No Risk No Risk        Assessment and Plan: GENNAVIEVE HUQ is a 46 year old Caucasian female on disability, has a history of bipolar disorder, PTSD, history of borderline personality disorder, chronic pain, fibromyalgia, SLE, diabetes, OSA, migraine headaches, recent right sided swelling of the face was evaluated in office today.  Patient is currently on a lower dosage of Latuda, tolerating it well and reports mood symptoms as improving.  Plan Bipolar disorder type I mixed mild in remission Latuda 20 mg p.o. daily with supper Cogentin 1 mg p.o. daily as needed for side effects of Latuda Venlafaxine 37.5 mg p.o. daily  GAD-improving Venlafaxine extended release 37.5 mg p.o. daily Xanax 0.25 mg as needed for anxiety attacks Continue CBT  PTSD-stable Venlafaxine extended release 37.5 mg p.o. daily  Insomnia-improving Lunesta 1 mg p.o. nightly  Akathisia-resolved Currently tolerating the lower dose of Latuda.  At risk for prolonged QT syndrome-EKG pending.  Patient has been noncompliant.  We will order EKG again.  Provided [id number].  Patient agrees to get it done today.  Follow-up in clinic in 2 to 3 months or sooner if needed.  This note was generated in part or whole with voice  recognition software. Voice recognition is usually quite accurate but there are transcription errors that can and very often do occur. I apologize for any typographical errors that were not detected and corrected.       [name], MD 11/06/2021, 8:22 AM

## 2021-11-05 NOTE — Patient Instructions (Signed)
Call [phone] for ekg

## 2021-11-06 ENCOUNTER — Other Ambulatory Visit: Payer: Self-pay | Admitting: Obstetrics and Gynecology

## 2021-11-06 NOTE — Patient Outreach (Signed)
Medicaid Managed Care   Nurse Care Manager Note  11/06/2021 Name:  Alison [name] MRN:  [id number] DOB:  March 28, 1976  Alison [name] is an 46 y.o. year old female who is a primary patient of Alison Merino, FNP.  The [facility] Managed Care Coordination team was consulted for assistance with:    Chronic healthcare management needs  [name] was given information about Medicaid Managed Care Coordination team services today. Alison [name] Patient agreed to services and verbal consent obtained.  Engaged with patient by telephone for follow up visit in response to provider referral for case management and/or care coordination services.   Assessments/Interventions:  Review of past medical history, allergies, medications, health status, including review of consultants reports, laboratory and other test data, was performed as part of comprehensive evaluation and provision of chronic care management services.  SDOH (Social Determinants of Health) assessments and interventions performed: SDOH Interventions    Flowsheet Row Most Recent Value  SDOH Interventions   Housing Interventions Intervention Not Indicated  Social Connections Interventions Intervention Not Indicated       Care Plan  Allergies  Allergen Reactions   Penicillin G Itching, Rash, Anaphylaxis and Palpitations   Lamictal [Lamotrigine] Rash   Carbamazepine Other (See Comments)    Medication interaction-prograf med interaction med interaction med interaction Medication interaction-prograf med interaction med interaction Medication interaction-prograf   Hydrocodone-Acetaminophen Itching   Naproxen Itching    Medications Reviewed Today     Reviewed by [name], RN (Registered Nurse) on 11/06/21 at 1253  Med List Status: <None>   Medication Order Taking? Sig Documenting Provider Last Dose Status Informant  Atogepant (QULIPTA) 30 MG TABS [id number] No Take 30 mg by mouth daily. Alison Merino, FNP  Taking Active   baclofen (LIORESAL) 10 MG tablet [id number] No Take 10 mg by mouth 2 (two) times daily. [provider] Taking Active   benztropine (COGENTIN) 1 MG tablet [id number] No Take 1 tablet (1 mg total) by mouth daily as needed for tremors. [name], MD Taking Active Self  Cranberry 500 MG CAPS [id number] No Take by mouth. [provider] Taking Active   Dulaglutide (TRULICITY) 3 VO/1.6WV SOPN [id number] No Inject 3 mg into the skin once a week. [provider] Taking Active   ergocalciferol (VITAMIN D2) 1.25 MG (50000 UT) capsule [id number] No Take 1 capsule by mouth once a week. [provider] Taking Active   eszopiclone (LUNESTA) 1 MG TABS tablet [id number] No Take 1 tablet (1 mg total) by mouth at bedtime as needed for sleep. Take immediately before bedtime [name], MD Taking Active   ezetimibe (ZETIA) 10 MG tablet [id number] No Take 10 mg by mouth daily. [provider] Taking Active   fluticasone (FLONASE) 50 MCG/ACT nasal spray [id number]  Place into the nose. [provider]  Active   glucose blood (COOL BLOOD GLUCOSE TEST STRIPS) test strip [id number] No Use as instructed Steele Sizer, MD Taking Active   icosapent Ethyl (VASCEPA) 1 g capsule [id number] No Take 2 capsules (2 g total) by mouth 2 (two) times daily. [name], MD Taking Active   lisinopril (ZESTRIL) 20 MG tablet [id number] No Take 1 tablet (20 mg total) by mouth daily. [name], PA-C Taking Active   lurasidone (LATUDA) 20 MG TABS tablet [id number]  Take 1 tablet (20 mg total) by mouth daily with supper. [name], MD  Active   Magnesium 500 MG CAPS [id number] No Take by mouth. [provider] Taking Active   medroxyPROGESTERone (PROVERA) 5 MG tablet [id number] No Take 1 tablet (5 mg total) by mouth 2 (two) times daily for 7 days, THEN 1 tablet (5 mg total) daily for 21 days. Use for twelve consecutive days every month while on estrogen  therapy. Homero Fellers, MD Taking Active   Melatonin 10 MG TABS [id number] No Take 10 mg by mouth at bedtime as needed (sleep). [provider] Taking Active Self  metFORMIN (GLUCOPHAGE) 1000 MG tablet [id number] No TAKE ONE TABLET BY MOUTH TWICE DAILY with meals Alison Merino, FNP Taking Active   Multiple Vitamin (MULTI-VITAMIN) tablet [id number] No Take 1 tablet by mouth daily. [provider] Taking Active Self  MYRBETRIQ 50 MG TB24 tablet [id number] No TAKE ONE TABLET BY MOUTH ONCE DAILY Vaillancourt, Samantha, PA-C Taking Active   naloxone (NARCAN) nasal spray 4 mg/0.1 mL [id number] No  [provider] Taking Active   oxyCODONE ER (XTAMPZA ER) 13.5 MG C12A [id number] No Take 1 tablet by mouth 2 (two) times daily. [provider] Taking Active   pregabalin (LYRICA) 200 MG capsule [id number] No Take 200 mg by mouth 2 (two) times daily. [provider] Taking Active   sulfamethoxazole-trimethoprim (BACTRIM DS) 800-160 MG per tablet 1 tablet [id number]   Hollice Espy, MD  Active   tacrolimus (PROGRAF) 1 MG capsule [id number] No Take 1 capsule by mouth once daily  Patient taking differently: Take 1 mg by mouth 1 day or 1 dose.   [name], FNP Taking Active Self           Med Note [name], [name]   Fri Jan 19, 2021  5:01 AM)    venlafaxine XR (EFFEXOR-XR) 37.5 MG 24 hr capsule [id number] No TAKE ONE CAPSULE BY MOUTH ONCE DAILY WITH Eldridge Abrahams, MD Taking Active   XYLIMELTS 550 MG DISK [id number] No USE one BY MOUTH EVERY 8 HOURS AS NEEDED FOR DRY MOUTH [provider] Taking Active             Patient Active Problem List   Diagnosis Date Noted   Akathisia 10/24/2021   At risk for prolonged QT interval syndrome 09/12/2021   Prolonged Q-T interval on ECG 08/29/2021   Insomnia 07/20/2021   Bipolar disorder, in full remission, most recent episode mixed (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   Edema of lower extremity 09/16/2019   Carpal tunnel syndrome, left 07/26/2019   Cubital tunnel syndrome on left 07/26/2019   Bipolar I disorder, most recent episode depressed ([place]) 07/12/2019   PTSD (post-traumatic stress disorder) 00/93/8182   Umbilical hernia without obstruction and without gangrene 06/17/2019   Encounter for surveillance of abnormal nevi 06/17/2019   Pineal gland cyst 06/17/2019   Chronic hip pain, left 06/02/2019   Lumbar spondylosis 06/02/2019   Mouth dryness 06/02/2019   Obesity (BMI 35.0-39.9 without comorbidity) 06/02/2019   Goals of care, counseling/discussion 05/18/2019   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[place]) 05/18/2019   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[place]) 05/18/2019   Occipital neuralgia 05/13/2019   Plantar  fasciitis of left foot 04/27/2019   Fibromyalgia 03/23/2019   Systemic lupus erythematosus (SLE) in adult [facility]) 03/23/2019   Essential hypertension 03/23/2019   Hepatitis 03/23/2019   Mass of left kidney 03/23/2019   Diabetes mellitus ([place]) 03/23/2019   Nausea without vomiting 03/23/2019   Neuropathy 03/23/2019   Cancer ([place]) 03/23/2019   Hyperlipidemia 03/23/2019   Osteoarthritis 03/23/2019   Trigeminal neuralgia 03/23/2019   Chronic renal disease, stage III ([place]) 03/23/2019   Nephrolithiasis 03/23/2019   Migraine 03/23/2019   OSA on CPAP 03/23/2019   Fibrocystic breast 03/23/2019   Heart murmur 03/23/2019   Bipolar disorder, in partial remission,  most recent episode depressed ([place]) 07/02/2017   Abnormal uterine bleeding 03/18/2017   Major depressive disorder, recurrent ([place]) 03/18/2017   Morbid obesity ([place]) 03/18/2017   Secondary hyperparathyroidism ([place]) 02/07/2017   Neuropathic pain 11/19/2016   Post herpetic neuralgia 11/19/2016   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[place]) 01/13/2015   Lumbar disc disease with radiculopathy 07/26/2013   S/P liver transplant ([place]) 07/26/2013   Type 2 diabetes mellitus, uncontrolled 07/26/2013   Facial nerve disorder 06/29/2012   Low back pain 12/26/2010    Conditions to be addressed/monitored per PCP order:   chronic healthcare management needs, DM, chronic pain, OSA, migraines, LBP, anxiety, depression, bipolar, PTSD, LDD, DLBCL  Care Plan : Chronic Pain (Adult)  Updates made by [name], RN since 11/06/2021 12:00 AM     Problem: Chronic Pain Management-fibromyalgia   Priority: High  Onset Date: 01/22/2021     Long-Range Goal: Fibromyalgia pain managed-new pain management provider   Start Date: 08/14/2020  Expected End Date: 12/08/2021  Recent Progress: On track  Priority: High  Note:   Current Barriers:  Care Coordination needs related to pain management provider.   Patient is experiencing fibromyalgia flair and is interested in help being connected to a pain management provider. Unable to independently secure referral or appointment for pain management provider. Update 11/06/21:  patient without complaint today-states her pain is managed with her medications, did fall on left knee about 2 weeks ago-? Getting better  Nurse Case Manager Clinical Goal(s):  Over the next 45 days, patient will work with [facility] to address needs related to referral for pain management provider and associated care coordination needs. Update 09/22/20:  patient is currently seeing Dr. Humphrey Rolls at [facility].  Interventions:  Inter-disciplinary care team collaboration (see longitudinal  plan of care) Evaluation of current treatment plan related to fibromyalgia  and patient's adherence to plan as established by provider. Update 06/15/21:  Patient taking Baclofen again, Effexor added. Collaborated with primary care provider regarding recommendations and referral to pain management provider. Discussed plans with patient for ongoing care management follow up and provided patient with direct contact information for care management team Anticipate pain education program, pain management support as part of pain management referral.       Patient Goals/Self-Care Activities Over the next 45 days, patient will:  -Attends all scheduled provider appointments  develop a personal pain management plan with your pain management provider.  Follow Up Plan:  The Managed Medicaid care management team will reach out to the patient again over the next 30 days.    Care Plan : Wellness (Adult)  Updates made by [name], RN since 11/06/2021 12:00 AM     Problem: Medication Adherence (Wellness)   Priority: High  Onset Date: 01/22/2021     Long-Range Goal: Medication Adherence Maintained   Start  Date: 09/06/2020  Expected End Date: 02/04/2022  This Visit's Progress: On track  Recent Progress: Not on track  Priority: High  Note:   Current Barriers:  Patient awaiting EMB 11/07/21 for vaginal bleeding/spotting.  CARDS appt in February as well as EKG.  To contact provider about CPAP order.  Blood suagrs 150s per patient, checks once a day  Nurse Case Manager Clinical Goal(s):  Over the next 30 days, patient will work with CM team pharmacist to review current medications. Update 01/22/21:  Patient met with Pharmacist and continues to follow. Over the next 30 days, patient will meet with Nutritionist and check her blood sugars. Over the next 30 days, patient will attend all scheduled appointments.  Interventions:  Inter-disciplinary care team collaboration (see longitudinal plan of  care) Evaluation of current treatment plan and patient's adherence to plan as established by provider. Advised patient to contact her PCP for any medication needs. Reviewed medications with patient. Collaborated with pharmacy regarding medications.  Discussed plans with patient for ongoing care management follow up and provided patient with direct contact information for care management team Pharmacy referral for medication review. Patient given phone number for [facility] transportation if needed. Will notify PCP of need for testing strips and dietician referral. Collaborated with PCP for cardiology appointment. Collaborated with Care Guide for dental resources. Care guide referral for dental resources.  Patient Goals/Self-Care Activities Over the next 30 days, patient will:  -Patient will take medications as prescribed. Calls pharmacy for medication refills Calls provider office for new concerns or questions  Follow Up Plan: The Managed Medicaid care management team will reach out to the patient again over the next 30 days.  The patient has been provided with contact information for the Managed Medicaid care management team and has been advised to call with any health related questions or concerns.    Follow Up:  Patient agrees to Care Plan and Follow-up.  Plan: The Managed Medicaid care management team will reach out to the patient again over the next 30 days. and The  Patient has been provided with contact information for the Managed Medicaid care management team and has been advised to call with any health related questions or concerns.  Date/time of next scheduled RN care management/care coordination outreach:  12/07/21 at 1230.

## 2021-11-06 NOTE — Patient Instructions (Signed)
Hi Ms. Bridgewater, thank you for speaking with me this afternoon, have a great rest of the day!  Ms. Strauser was given information about Medicaid Managed Care team care coordination services as a part of their Healthy [facility] Medicaid benefit. [name] verbally consented to engagement with the [facility] Managed Care team.   If you are experiencing a medical emergency, please call 911 or report to your local emergency department or urgent care.   If you have a non-emergency medical problem during routine business hours, please contact your provider's office and ask to speak with a nurse.   For questions related to your Healthy [facility] health plan, please call: [phone] or visit the homepage here: [email or web address]  If you would like to schedule transportation through your Healthy [facility] plan, please call the following number at least 2 days in advance of your appointment: [phone]  Call the [place] at [phone], at any time, 24 hours a day, 7 days a week. If you are in danger or need immediate medical attention call 911.  If you would like help to quit smoking, call 1-800-QUIT-NOW 440-872-2881) OR Espaol: 1-855-Djelo-Ya (9-675-916-3846) o para ms informacin haga clic aqu or Text READY to 200-400 to register via text  Ms. Ostermann - following are the goals we discussed in your visit today:   Goals Addressed             This Visit's Progress    Chronic Pain Managed       Update 11/06/21:  Patient states her pain is managed by her medications.  Evidence-based guidance:  Address common beliefs about pain, such as pain is to be endured, a normal part of aging or that it is not "real"; feelings of resignation that nothing can be done and that complaining will be a sign of weakness.  Assess pain level, treatment efficacy and patient response at regular intervals using a consistent pain scale.  Assess  pain using self-report (most reliable), family/caregiver report, validated pain scale; consider impact on quality of life.  Determine if pain is associated with mobility or at rest, location, intensity, frequency, duration, recurrence, pattern and description (e.g., cramping, burning, aching), triggers and relieving factors.   Anticipate referral to pain education program, pain management support or community resources for specific diagnoses (e.g., cancer, fibromyalgia, multiple sclerosis).  Explore fears associated with anticipated or imagined pain; encourage acceptance-based approaches.  Encourage exposure to experiences previously avoided due to fear of pain.  Anticipate referral to pain management specialist, physical therapist, addiction specialist (if history of substance use), psychotherapist; advocate for consultation with pharmacist.  Initiate nonpharmacologic measures, such as cognitive behavior therapy, mindfulness, guided imagery, massage, distraction, relaxation, chiropractic manipulation, dietary supplements or acupuncture.  Provide multimodal treatment interventions, such as physical activity, therapeutic exercise, yoga, TENS (transcutaneous electrical nerve stimulation) and manual therapy.  Train in functional activity modifications, such as body mechanics, posture, ergonomics, energy conservation and activity pacing.  Encourage use of local anesthetic or analgesic therapy as an adjunct for pain control (e.g., lidocaine patch, capsaicin cream, topical nonsteroidal anti-inflammatory drugs).  Prepare patient for use of pharmacologic therapy in a stepped approach that may include acetaminophen, nonsteroidal anti-inflammatory drugs, opioid, antiepileptic, antidepressant or nonbenzodiazepine muscle relaxant.  Review efficacy, tolerability, adherence and manage medication-induced side effects.        Get sugars at goal       Timeframe:  Short-Term Goal Priority:  High Start Date:  Expected End Date:      ongoing                 Follow Up Date Monthly 12/07/21   - call for medicine refill 2 or 3 days before it runs out - call if I am sick and can't take my medicine - keep a list of all the medicines I take; vitamins and herbals too    Why is this important?   These steps will help you keep on track with your medicines.   11/06/21:  Blood sugars 150's per patient, checking once a day    The patient verbalized understanding of instructions provided today and agreed to receive a mailed copy of patient instruction and/or educational materials.  The Managed Medicaid care management team will reach out to the patient again over the next 30 days.  The  Patient has been provided with contact information for the Managed Medicaid care management team and has been advised to call with any health related questions or concerns.   [name], [name] [place] Management Coordinator - Managed Medicaid High Risk [phone]   Following is a copy of your plan of care:  Care Plan : Chronic Pain (Adult)  Updates made by [name], RN since 11/06/2021 12:00 AM     Problem: Chronic Pain Management-fibromyalgia   Priority: High  Onset Date: 01/22/2021     Long-Range Goal: Fibromyalgia pain managed-new pain management provider   Start Date: 08/14/2020  Expected End Date: 12/08/2021  Recent Progress: On track  Priority: High  Note:   Current Barriers:  Care Coordination needs related to pain management provider.   Patient is experiencing fibromyalgia flair and is interested in help being connected to a pain management provider. Unable to independently secure referral or appointment for pain management provider. Update 11/06/21:  patient without complaint today-states her pain is managed with her medications, did fall on left knee about 2 weeks ago-? Getting better  Nurse Case Manager Clinical Goal(s):  Over the next 45  days, patient will work with [facility] to address needs related to referral for pain management provider and associated care coordination needs. Update 09/22/20:  patient is currently seeing Dr. Humphrey Rolls at [facility].  Interventions:  Inter-disciplinary care team collaboration (see longitudinal plan of care) Evaluation of current treatment plan related to fibromyalgia  and patient's adherence to plan as established by provider. Update 06/15/21:  Patient taking Baclofen again, Effexor added. Collaborated with primary care provider regarding recommendations and referral to pain management provider. Discussed plans with patient for ongoing care management follow up and provided patient with direct contact information for care management team Anticipate pain education program, pain management support as part of pain management referral.       Patient Goals/Self-Care Activities Over the next 45 days, patient will:  -Attends all scheduled provider appointments  develop a personal pain management plan with your pain management provider.  Follow Up Plan:  The Managed Medicaid care management team will reach out to the patient again over the next 30 day   Care Plan : Wellness (Adult)  Updates made by [name], RN since 11/06/2021 12:00 AM     Problem: Medication Adherence (Wellness)   Priority: High  Onset Date: 01/22/2021     Long-Range Goal: Medication Adherence Maintained   Start Date: 09/06/2020  Expected End Date: 02/04/2022  This Visit's Progress: On track  Recent Progress: Not on  track  Priority: High  Note:   Current Barriers:  Patient awaiting EMB 11/07/21 for vaginal bleeding/spotting.  CARDS appt in February as well as EKG.  To contact provider about CPAP order.  Blood suagrs 150s per patient, checks once a day  Nurse Case Manager Clinical Goal(s):  Over the next 30 days, patient will work with CM team pharmacist to review current medications. Update 01/22/21:   Patient met with Pharmacist and continues to follow. Over the next 30 days, patient will meet with Nutritionist and check her blood sugars. Over the next 30 days, patient will attend all scheduled appointments.  Interventions:  Inter-disciplinary care team collaboration (see longitudinal plan of care) Evaluation of current treatment plan and patient's adherence to plan as established by provider. Advised patient to contact her PCP for any medication needs. Reviewed medications with patient. Collaborated with pharmacy regarding medications.  Discussed plans with patient for ongoing care management follow up and provided patient with direct contact information for care management team Pharmacy referral for medication review. Patient given phone number for [facility] transportation if needed. Will notify PCP of need for testing strips and dietician referral. Collaborated with PCP for cardiology appointment. Collaborated with Care Guide for dental resources. Care guide referral for dental resources.  Patient Goals/Self-Care Activities Over the next 30 days, patient will:  -Patient will take medications as prescribed. Calls pharmacy for medication refills Calls provider office for new concerns or questions  Follow Up Plan: The Managed Medicaid care management team will reach out to the patient again over the next 30 days.  The patient has been provided with contact information for the Managed Medicaid care management team and has been advised to call with any health related questions or concerns.

## 2021-11-07 ENCOUNTER — Other Ambulatory Visit: Payer: Self-pay

## 2021-11-07 ENCOUNTER — Ambulatory Visit
Admission: RE | Admit: 2021-11-07 | Discharge: 2021-11-07 | Disposition: A | Discharge status: Home / Self Care | Payer: Medicaid Other | Source: Ambulatory Visit | Attending: Psychiatry | Admitting: Psychiatry

## 2021-11-07 ENCOUNTER — Encounter: Payer: Self-pay | Admitting: Obstetrics and Gynecology

## 2021-11-07 ENCOUNTER — Ambulatory Visit: Payer: Medicaid Other | Admitting: Obstetrics and Gynecology

## 2021-11-07 ENCOUNTER — Telehealth: Payer: Self-pay

## 2021-11-07 ENCOUNTER — Telehealth: Payer: Self-pay | Admitting: Psychiatry

## 2021-11-07 VITALS — BP 120/70 | Ht 66.0 in | Wt 243.0 lb

## 2021-11-07 DIAGNOSIS — Z9189 Other specified personal risk factors, not elsewhere classified: Secondary | ICD-10-CM | POA: Insufficient documentation

## 2021-11-07 DIAGNOSIS — I4510 Unspecified right bundle-branch block: Secondary | ICD-10-CM | POA: Insufficient documentation

## 2021-11-07 DIAGNOSIS — N939 Abnormal uterine and vaginal bleeding, unspecified: Secondary | ICD-10-CM | POA: Diagnosis not present

## 2021-11-07 DIAGNOSIS — I4581 Long QT syndrome: Secondary | ICD-10-CM | POA: Diagnosis not present

## 2021-11-07 NOTE — Telephone Encounter (Signed)
-----   Message from Homero Fellers, MD sent at 10/31/2021  5:04 PM EST ----- Surgery Booking Request Patient Full Name:  Alison [name]  MRN: [id number]  DOB: 12/17/1975  Surgeon: Homero Fellers, MD  Requested Surgery Date and Time: next 4 weeks Primary Diagnosis AND Code: postmenopausal bleeding Secondary Diagnosis and Code:  Surgical Procedure: Hysteroscopy D&C RNFA Requested?: No L&D Notification: No Admission Status: same day surgery Length of Surgery: 50 min Special Case Needs: No H&P: No Phone Interview???:  Yes Interpreter: No Medical Clearance:  No Special Scheduling Instructions: No Any known health/anesthesia issues, diabetes, sleep apnea, latex allergy, defibrillator/pacemaker?: No Acuity: P3   (P1 highest, P2 delay may cause harm, P3 low, elective gyn, P4 lowest) Post op follow up visits: 1 week post op

## 2021-11-07 NOTE — H&P (View-Only) (Signed)
Patient ID: Alison [name], female   DOB: 1976-02-08, 46 y.o.   MRN: [id number]  Reason for Consult: Follow-up   Referred by No ref. provider found  Subjective:     HPI:  Alison [name] is a 46 y.o. female.  She is here for follow-up regarding menorrhagia.  She reports that with taking Provera and her bleeding has decreased significantly.  She reports that currently she only sees brown spotting when she wipes.  She does not see blood on her sanitary napkins.  She had recent pelvic ultrasound which showed a thickened endometrium 11 mm.  There endometrium was particularly prominent at the fundus.  She is also had a PET which did not show any enhancement in her pelvic region.  Gynecological History  Patient's last menstrual period was 10/16/2021 (exact date).  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   Chemo tx's   Migraine    Morbid obesity (HCC)    Neuromuscular disorder (HCC)    neuropathy   Neuropathy    Personality disorder (HCC)    Post herpetic neuralgia    PTSD (post-traumatic stress disorder)    Renal disorder    S/P liver transplant ([place])    Family History  Problem Relation Age of Onset   Heart disease Mother    Hypertension Mother    Cancer - Other Mother    Bipolar disorder Mother    Heart disease Father    Hypertension Father    Diabetes Father    Parkinson's disease Maternal  Grandmother    Cancer Maternal Aunt    Cancer Maternal Uncle    Cancer Maternal Grandfather    Lupus Paternal Grandmother    Hypertension Brother    Past Surgical History:  Procedure Laterality Date   BONE MARROW BIOPSY  01/13/2015   BREAST BIOPSY  12/2014   BREAST BIOPSY  2011   BREAST SURGERY     CHOLECYSTECTOMY     COLONOSCOPY     ESOPHAGOGASTRODUODENOSCOPY     ESOPHAGOGASTRODUODENOSCOPY (EGD) WITH PROPOFOL N/A 01/09/2021   Procedure: ESOPHAGOGASTRODUODENOSCOPY (EGD) WITH PROPOFOL;  Surgeon: [name], MD;  Location: [facility] ENDOSCOPY;  Service: Endoscopy;  Laterality: N/A;   HERNIA REPAIR     infusaport     LIVER TRANSPLANT  12/17/1991   LUMBAR PUNCTURE     PORT A CATH INJECTION ([place] [facility]X)     tumor removal  2015    Short Social History:  Social History   Tobacco Use   Smoking status: Never   Smokeless tobacco: Never  Substance Use Topics   Alcohol use: Not Currently    Allergies  Allergen Reactions   Penicillin G Itching, Rash, Anaphylaxis and Palpitations   Lamictal [Lamotrigine] Rash   Carbamazepine Other (See Comments)  Medication interaction-prograf med interaction med interaction med interaction Medication interaction-prograf med interaction med interaction Medication interaction-prograf   Hydrocodone-Acetaminophen Itching   Naproxen Itching    Current Outpatient Medications  Medication Sig Dispense Refill   Atogepant (QULIPTA) 30 MG TABS Take 30 mg by mouth daily. 30 tablet 0   baclofen (LIORESAL) 10 MG tablet Take 10 mg by mouth 2 (two) times daily.     benztropine (COGENTIN) 1 MG tablet Take 1 tablet (1 mg total) by mouth daily as needed for tremors. 30 tablet 1   Cranberry 500 MG CAPS Take by mouth.     Dulaglutide (TRULICITY) 3 XB/9.3JQ SOPN Inject 3 mg into the skin once a week.     ergocalciferol (VITAMIN D2) 1.25 MG (50000 UT) capsule Take 1 capsule by mouth once a week.     eszopiclone (LUNESTA) 1 MG TABS tablet Take 1 tablet (1 mg total)  by mouth at bedtime as needed for sleep. Take immediately before bedtime 30 tablet 1   ezetimibe (ZETIA) 10 MG tablet Take 10 mg by mouth daily.     fluticasone (FLONASE) 50 MCG/ACT nasal spray Place into the nose.     glucose blood (COOL BLOOD GLUCOSE TEST STRIPS) test strip Use as instructed 100 each 12   icosapent Ethyl (VASCEPA) 1 g capsule Take 2 capsules (2 g total) by mouth 2 (two) times daily. 120 capsule 5   lisinopril (ZESTRIL) 20 MG tablet Take 1 tablet (20 mg total) by mouth daily. 90 tablet 3   lurasidone (LATUDA) 20 MG TABS tablet Take 1 tablet (20 mg total) by mouth daily with supper. 90 tablet 0   Magnesium 500 MG CAPS Take by mouth.     medroxyPROGESTERone (PROVERA) 5 MG tablet Take 1 tablet (5 mg total) by mouth 2 (two) times daily for 7 days, THEN 1 tablet (5 mg total) daily for 21 days. Use for twelve consecutive days every month while on estrogen therapy. 35 tablet 0   Melatonin 10 MG TABS Take 10 mg by mouth at bedtime as needed (sleep).     metFORMIN (GLUCOPHAGE) 1000 MG tablet TAKE ONE TABLET BY MOUTH TWICE DAILY with meals 180 tablet 2   Multiple Vitamin (MULTI-VITAMIN) tablet Take 1 tablet by mouth daily.     MYRBETRIQ 50 MG TB24 tablet TAKE ONE TABLET BY MOUTH ONCE DAILY 90 tablet 0   naloxone (NARCAN) nasal spray 4 mg/0.1 mL      oxyCODONE ER (XTAMPZA ER) 13.5 MG C12A Take 1 tablet by mouth 2 (two) times daily.     pregabalin (LYRICA) 200 MG capsule Take 200 mg by mouth 2 (two) times daily.     tacrolimus (PROGRAF) 1 MG capsule Take 1 capsule by mouth once daily (Patient taking differently: Take 1 mg by mouth 1 day or 1 dose.) 30 capsule 0   venlafaxine XR (EFFEXOR-XR) 37.5 MG 24 hr capsule TAKE ONE CAPSULE BY MOUTH ONCE DAILY WITH BREAKFAST 90 capsule 0   XYLIMELTS 550 MG DISK USE one BY MOUTH EVERY 8 HOURS AS NEEDED FOR DRY MOUTH     Current Facility-Administered Medications  Medication Dose Route Frequency Provider Last Rate Last Admin    sulfamethoxazole-trimethoprim (BACTRIM DS) 800-160 MG per tablet 1 tablet  1 tablet Oral Q12H Hollice Espy, MD   1 tablet at 01/17/21 1432    Review of Systems  Constitutional: Negative for chills, fatigue, fever and unexpected weight change.  HENT: Negative for trouble swallowing.  Eyes: Negative for loss of vision.  Respiratory: Negative for cough, shortness of breath and wheezing.  Cardiovascular: Negative for chest pain, leg swelling, palpitations and syncope.  GI: Negative for abdominal pain, blood in stool, diarrhea, nausea and vomiting.  GU: Negative for difficulty urinating, dysuria, frequency and hematuria.  Musculoskeletal: Negative for back pain, leg pain and joint pain.  Skin: Negative for rash.  Neurological: Negative for dizziness, headaches, light-headedness, numbness and seizures.  Psychiatric: Negative for behavioral problem, confusion, depressed mood and sleep disturbance.       Objective:  Objective   Vitals:   11/07/21 1313  BP: 120/70  Weight: 243 lb (110.2 kg)  Height: 5' 6"  (1.676 m)   Body mass index is 39.22 kg/m.  Physical Exam Vitals and nursing note reviewed. Exam conducted with a chaperone present.  Constitutional:      Appearance: Normal appearance.  HENT:     Head: Normocephalic and atraumatic.  Eyes:     Extraocular Movements: Extraocular movements intact.     Pupils: Pupils are equal, round, and reactive to light.  Cardiovascular:     Rate and Rhythm: Normal rate and regular rhythm.  Pulmonary:     Effort: Pulmonary effort is normal.     Breath sounds: Normal breath sounds.  Abdominal:     General: Abdomen is flat.     Palpations: Abdomen is soft.  Musculoskeletal:     Cervical back: Normal range of motion.  Skin:    General: Skin is warm and dry.  Neurological:     General: No focal deficit present.     Mental Status: She is alert and oriented to person, place, and time.  Psychiatric:        Behavior: Behavior normal.         Thought Content: Thought content normal.        Judgment: Judgment normal.    Assessment/Plan:    46 yo with AUB, improved with Provera.  Okay to discontinue provera at this time, could restart if bleeding resumes.  Will plan hysteroscopy D&C to sample tissue at the uterine fundus.   More than 5 minutes were spent face to face with the patient in the room, reviewing the medical record, labs and images, and coordinating care for the patient. The plan of management was discussed in detail and counseling was provided.   Adrian Prows MD [facility], [facility] 11/07/2021 1:41 PM

## 2021-11-07 NOTE — Telephone Encounter (Signed)
Orders placed.

## 2021-11-07 NOTE — Progress Notes (Signed)
Patient ID: Alison [name], female   DOB: 02/19/1976, 46 y.o.   MRN: [id number]  Reason for Consult: Follow-up   Referred by No ref. provider found  Subjective:     HPI:  Alison [name] is a 46 y.o. female.  She is here for follow-up regarding menorrhagia.  She reports that with taking Provera and her bleeding has decreased significantly.  She reports that currently she only sees brown spotting when she wipes.  She does not see blood on her sanitary napkins.  She had recent pelvic ultrasound which showed a thickened endometrium 11 mm.  There endometrium was particularly prominent at the fundus.  She is also had a PET which did not show any enhancement in her pelvic region.  Gynecological History  Patient's last menstrual period was 10/16/2021 (exact date).  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   Chemo tx's   Migraine    Morbid obesity (HCC)    Neuromuscular disorder (HCC)    neuropathy   Neuropathy    Personality disorder (HCC)    Post herpetic neuralgia    PTSD (post-traumatic stress disorder)    Renal disorder    S/P liver transplant ([place])    Family History  Problem Relation Age of Onset   Heart disease Mother    Hypertension Mother    Cancer - Other Mother    Bipolar disorder Mother    Heart disease Father    Hypertension Father    Diabetes Father    Parkinson's disease Maternal  Grandmother    Cancer Maternal Aunt    Cancer Maternal Uncle    Cancer Maternal Grandfather    Lupus Paternal Grandmother    Hypertension Brother    Past Surgical History:  Procedure Laterality Date   BONE MARROW BIOPSY  01/13/2015   BREAST BIOPSY  12/2014   BREAST BIOPSY  2011   BREAST SURGERY     CHOLECYSTECTOMY     COLONOSCOPY     ESOPHAGOGASTRODUODENOSCOPY     ESOPHAGOGASTRODUODENOSCOPY (EGD) WITH PROPOFOL N/A 01/09/2021   Procedure: ESOPHAGOGASTRODUODENOSCOPY (EGD) WITH PROPOFOL;  Surgeon: [name], MD;  Location: [facility] ENDOSCOPY;  Service: Endoscopy;  Laterality: N/A;   HERNIA REPAIR     infusaport     LIVER TRANSPLANT  12/17/1991   LUMBAR PUNCTURE     PORT A CATH INJECTION ([place] HX)     tumor removal  2015    Short Social History:  Social History   Tobacco Use   Smoking status: Never   Smokeless tobacco: Never  Substance Use Topics   Alcohol use: Not Currently    Allergies  Allergen Reactions   Penicillin G Itching, Rash, Anaphylaxis and Palpitations   Lamictal [Lamotrigine] Rash   Carbamazepine Other (See Comments)  Medication interaction-prograf med interaction med interaction med interaction Medication interaction-prograf med interaction med interaction Medication interaction-prograf   Hydrocodone-Acetaminophen Itching   Naproxen Itching    Current Outpatient Medications  Medication Sig Dispense Refill   Atogepant (QULIPTA) 30 MG TABS Take 30 mg by mouth daily. 30 tablet 0   baclofen (LIORESAL) 10 MG tablet Take 10 mg by mouth 2 (two) times daily.     benztropine (COGENTIN) 1 MG tablet Take 1 tablet (1 mg total) by mouth daily as needed for tremors. 30 tablet 1   Cranberry 500 MG CAPS Take by mouth.     Dulaglutide (TRULICITY) 3 TG/6.2IR SOPN Inject 3 mg into the skin once a week.     ergocalciferol (VITAMIN D2) 1.25 MG (50000 UT) capsule Take 1 capsule by mouth once a week.     eszopiclone (LUNESTA) 1 MG TABS tablet Take 1 tablet (1 mg total)  by mouth at bedtime as needed for sleep. Take immediately before bedtime 30 tablet 1   ezetimibe (ZETIA) 10 MG tablet Take 10 mg by mouth daily.     fluticasone (FLONASE) 50 MCG/ACT nasal spray Place into the nose.     glucose blood (COOL BLOOD GLUCOSE TEST STRIPS) test strip Use as instructed 100 each 12   icosapent Ethyl (VASCEPA) 1 g capsule Take 2 capsules (2 g total) by mouth 2 (two) times daily. 120 capsule 5   lisinopril (ZESTRIL) 20 MG tablet Take 1 tablet (20 mg total) by mouth daily. 90 tablet 3   lurasidone (LATUDA) 20 MG TABS tablet Take 1 tablet (20 mg total) by mouth daily with supper. 90 tablet 0   Magnesium 500 MG CAPS Take by mouth.     medroxyPROGESTERone (PROVERA) 5 MG tablet Take 1 tablet (5 mg total) by mouth 2 (two) times daily for 7 days, THEN 1 tablet (5 mg total) daily for 21 days. Use for twelve consecutive days every month while on estrogen therapy. 35 tablet 0   Melatonin 10 MG TABS Take 10 mg by mouth at bedtime as needed (sleep).     metFORMIN (GLUCOPHAGE) 1000 MG tablet TAKE ONE TABLET BY MOUTH TWICE DAILY with meals 180 tablet 2   Multiple Vitamin (MULTI-VITAMIN) tablet Take 1 tablet by mouth daily.     MYRBETRIQ 50 MG TB24 tablet TAKE ONE TABLET BY MOUTH ONCE DAILY 90 tablet 0   naloxone (NARCAN) nasal spray 4 mg/0.1 mL      oxyCODONE ER (XTAMPZA ER) 13.5 MG C12A Take 1 tablet by mouth 2 (two) times daily.     pregabalin (LYRICA) 200 MG capsule Take 200 mg by mouth 2 (two) times daily.     tacrolimus (PROGRAF) 1 MG capsule Take 1 capsule by mouth once daily (Patient taking differently: Take 1 mg by mouth 1 day or 1 dose.) 30 capsule 0   venlafaxine XR (EFFEXOR-XR) 37.5 MG 24 hr capsule TAKE ONE CAPSULE BY MOUTH ONCE DAILY WITH BREAKFAST 90 capsule 0   XYLIMELTS 550 MG DISK USE one BY MOUTH EVERY 8 HOURS AS NEEDED FOR DRY MOUTH     Current Facility-Administered Medications  Medication Dose Route Frequency Provider Last Rate Last Admin    sulfamethoxazole-trimethoprim (BACTRIM DS) 800-160 MG per tablet 1 tablet  1 tablet Oral Q12H Hollice Espy, MD   1 tablet at 01/17/21 1432    Review of Systems  Constitutional: Negative for chills, fatigue, fever and unexpected weight change.  HENT: Negative for trouble swallowing.  Eyes: Negative for loss of vision.  Respiratory: Negative for cough, shortness of breath and wheezing.  Cardiovascular: Negative for chest pain, leg swelling, palpitations and syncope.  GI: Negative for abdominal pain, blood in stool, diarrhea, nausea and vomiting.  GU: Negative for difficulty urinating, dysuria, frequency and hematuria.  Musculoskeletal: Negative for back pain, leg pain and joint pain.  Skin: Negative for rash.  Neurological: Negative for dizziness, headaches, light-headedness, numbness and seizures.  Psychiatric: Negative for behavioral problem, confusion, depressed mood and sleep disturbance.       Objective:  Objective   Vitals:   11/07/21 1313  BP: 120/70  Weight: 243 lb (110.2 kg)  Height: 5' 6"  (1.676 m)   Body mass index is 39.22 kg/m.  Physical Exam Vitals and nursing note reviewed. Exam conducted with a chaperone present.  Constitutional:      Appearance: Normal appearance.  HENT:     Head: Normocephalic and atraumatic.  Eyes:     Extraocular Movements: Extraocular movements intact.     Pupils: Pupils are equal, round, and reactive to light.  Cardiovascular:     Rate and Rhythm: Normal rate and regular rhythm.  Pulmonary:     Effort: Pulmonary effort is normal.     Breath sounds: Normal breath sounds.  Abdominal:     General: Abdomen is flat.     Palpations: Abdomen is soft.  Musculoskeletal:     Cervical back: Normal range of motion.  Skin:    General: Skin is warm and dry.  Neurological:     General: No focal deficit present.     Mental Status: She is alert and oriented to person, place, and time.  Psychiatric:        Behavior: Behavior normal.         Thought Content: Thought content normal.        Judgment: Judgment normal.    Assessment/Plan:    46 yo with AUB, improved with Provera.  Okay to discontinue provera at this time, could restart if bleeding resumes.  Will plan hysteroscopy D&C to sample tissue at the uterine fundus.   More than 5 minutes were spent face to face with the patient in the room, reviewing the medical record, labs and images, and coordinating care for the patient. The plan of management was discussed in detail and counseling was provided.   Adrian Prows MD [facility], [facility] 11/07/2021 1:41 PM

## 2021-11-07 NOTE — Telephone Encounter (Signed)
Called patient to schedule hysteroscopy d&c w [facility]  DOS 11/13/21  H&P n/a  Pre-admit phone call appointment to be requested. Also all appointments will be updated on pt MyChart. Explained that this appointment has a call window. Based on the time scheduled will indicate if the call will be received within a 4 hour window before 1:00 or after.  Advised that pt may also receive calls from the hospital pharmacy and pre-service center.  Confirmed pt has [employer] as [employer] certified [employer]. No secondary insurance.

## 2021-11-07 NOTE — Telephone Encounter (Signed)
Contacted patient to discuss recent EKG.  Patient also discuss with her cardiologist.

## 2021-11-09 ENCOUNTER — Other Ambulatory Visit: Payer: Self-pay

## 2021-11-09 ENCOUNTER — Encounter
Admission: RE | Admit: 2021-11-09 | Discharge: 2021-11-09 | Disposition: A | Discharge status: Home / Self Care | Payer: Medicaid Other | Source: Ambulatory Visit | Attending: Obstetrics and Gynecology | Admitting: Obstetrics and Gynecology

## 2021-11-09 HISTORY — DX: Autoimmune hepatitis: K75.4

## 2021-11-09 HISTORY — DX: Unspecified cirrhosis of liver: K74.60

## 2021-11-09 HISTORY — DX: Personal history of urinary calculi: Z87.442

## 2021-11-09 HISTORY — DX: Dyspnea, unspecified: R06.00

## 2021-11-09 HISTORY — DX: Other specified endocrine disorders: E34.8

## 2021-11-09 HISTORY — DX: Sleep apnea, unspecified: G47.30

## 2021-11-09 NOTE — Patient Instructions (Signed)
Your procedure is scheduled on:11-13-21 Tuesday Report to the Registration Desk on the 1st floor of the [facility].Then proceed to the 2nd floor Surgery Desk in the [facility] To find out your arrival time, please call [phone] between 1PM - 3PM on:11-12-21 Monday  REMEMBER: Instructions that are not followed completely may result in serious medical risk, up to and including death; or upon the discretion of your surgeon and anesthesiologist your surgery may need to be rescheduled.  Do not eat food after midnight the night before surgery.  No gum chewing, lozengers or hard candies.  You may however, drink Water up to 2 hours before you are scheduled to arrive for your surgery. Do not drink anything within 2 hours of your scheduled arrival time.  Type 1 and Type 2 diabetics should only drink water.  TAKE THESE MEDICATIONS THE MORNING OF SURGERY WITH A SIP OF WATER: -baclofen (LIORESAL) -ezetimibe (ZETIA)  -MYRBETRIQ  -oxyCODONE ER (XTAMPZA ER) -pregabalin (LYRICA)  -tacrolimus (PROGRAF)  -venlafaxine XR (EFFEXOR-XR)   Stop your metFORMIN (GLUCOPHAGE) 2 days prior to surgery-Last dose on 11-10-21 Saturday  One week prior to surgery: Stop Anti-inflammatories (NSAIDS) such as Advil, Aleve, Ibuprofen, Motrin, Naproxen, Naprosyn and Aspirin based products such as Excedrin, Goodys Powder, BC Powder.You may however, continue to take Tylenol if needed for pain up until the day of surgery.  Stop ANY OVER THE COUNTER supplements/vitamins NOW (11-09-21) until after surgery (VITAMIN D3, CRANBERRY,  Magnesium )-You may continue your Melatonin up until the day prior to surgery  No Alcohol for 24 hours before or after surgery.  No Smoking including e-cigarettes for 24 hours prior to surgery.  No chewable tobacco products for at least 6 hours prior to surgery.  No nicotine patches on the day of surgery.  Do not use any "recreational" drugs for at least a week prior to your surgery.  Please be  advised that the combination of cocaine and anesthesia may have negative outcomes, up to and including death. If you test positive for cocaine, your surgery will be cancelled.  On the morning of surgery brush your teeth with toothpaste and water, you may rinse your mouth with mouthwash if you wish. Do not swallow any toothpaste or mouthwash.  Do not wear jewelry, make-up, hairpins, clips or nail polish.  Do not wear lotions, powders, or perfumes.   Do not shave body from the neck down 48 hours prior to surgery just in case you cut yourself which could leave a site for infection.  Also, freshly shaved skin may become irritated if using the CHG soap.  Contact lenses, hearing aids and dentures may not be worn into surgery.  Do not bring valuables to the hospital. [facility] is not responsible for any missing/lost belongings or valuables.   Notify your doctor if there is any change in your medical condition (cold, fever, infection).  Wear comfortable clothing (specific to your surgery type) to the hospital.  After surgery, you can help prevent lung complications by doing breathing exercises.  Take deep breaths and cough every 1-2 hours. Your doctor may order a device called an Incentive Spirometer to help you take deep breaths. When coughing or sneezing, hold a pillow firmly against your incision with both hands. This is called splinting. Doing this helps protect your incision. It also decreases belly discomfort.  If you are being admitted to the hospital overnight, leave your suitcase in the car. After surgery it may be brought to your room.  If you  are being discharged the day of surgery, you will not be allowed to drive home. You will need a responsible adult (18 years or older) to drive you home and stay with you that night.   If you are taking public transportation, you will need to have a responsible adult (18 years or older) with you. Please confirm with your physician that it  is acceptable to use public transportation.   Please call the [facility] at [phone] if you have any questions about these instructions.  Surgery Visitation Policy:  Patients undergoing a surgery or procedure may have one family member or support person with them as long as that person is not COVID-19 positive or experiencing its symptoms.  That person may remain in the waiting area during the procedure and may rotate out with other people.  Inpatient Visitation:    Visiting hours are 7 a.m. to 8 p.m. Up to two visitors ages 16+ are allowed at one time in a patient room. The visitors may rotate out with other people during the day. Visitors must check out when they leave, or other visitors will not be allowed. One designated support person may remain overnight. The visitor must pass COVID-19 screenings, use hand sanitizer when entering and exiting the patients room and wear a mask at all times, including in the patients room. Patients must also wear a mask when staff or their visitor are in the room. Masking is required regardless of vaccination status.

## 2021-11-11 ENCOUNTER — Other Ambulatory Visit: Payer: Self-pay | Admitting: Psychiatry

## 2021-11-11 DIAGNOSIS — G4700 Insomnia, unspecified: Secondary | ICD-10-CM

## 2021-11-12 ENCOUNTER — Other Ambulatory Visit: Payer: Self-pay

## 2021-11-12 ENCOUNTER — Inpatient Hospital Stay: Payer: Medicaid Other

## 2021-11-12 ENCOUNTER — Inpatient Hospital Stay: Payer: Medicaid Other | Attending: Oncology | Admitting: Oncology

## 2021-11-12 ENCOUNTER — Encounter: Payer: Self-pay | Admitting: Oncology

## 2021-11-12 ENCOUNTER — Other Ambulatory Visit
Admission: RE | Admit: 2021-11-12 | Discharge: 2021-11-12 | Disposition: A | Discharge status: Home / Self Care | Payer: Medicaid Other | Source: Ambulatory Visit | Attending: Obstetrics and Gynecology | Admitting: Obstetrics and Gynecology

## 2021-11-12 VITALS — BP 106/79 | HR 87 | Temp 98.6°F | Resp 18 | Ht 66.0 in | Wt 237.0 lb

## 2021-11-12 DIAGNOSIS — M329 Systemic lupus erythematosus, unspecified: Secondary | ICD-10-CM | POA: Diagnosis not present

## 2021-11-12 DIAGNOSIS — N950 Postmenopausal bleeding: Secondary | ICD-10-CM | POA: Diagnosis not present

## 2021-11-12 DIAGNOSIS — D696 Thrombocytopenia, unspecified: Secondary | ICD-10-CM | POA: Insufficient documentation

## 2021-11-12 DIAGNOSIS — Z809 Family history of malignant neoplasm, unspecified: Secondary | ICD-10-CM | POA: Insufficient documentation

## 2021-11-12 DIAGNOSIS — E1122 Type 2 diabetes mellitus with diabetic chronic kidney disease: Secondary | ICD-10-CM | POA: Diagnosis not present

## 2021-11-12 DIAGNOSIS — N183 Chronic kidney disease, stage 3 unspecified: Secondary | ICD-10-CM | POA: Diagnosis not present

## 2021-11-12 DIAGNOSIS — I10 Essential (primary) hypertension: Secondary | ICD-10-CM | POA: Insufficient documentation

## 2021-11-12 DIAGNOSIS — E119 Type 2 diabetes mellitus without complications: Secondary | ICD-10-CM | POA: Diagnosis not present

## 2021-11-12 DIAGNOSIS — Z95828 Presence of other vascular implants and grafts: Secondary | ICD-10-CM

## 2021-11-12 DIAGNOSIS — E785 Hyperlipidemia, unspecified: Secondary | ICD-10-CM | POA: Diagnosis not present

## 2021-11-12 DIAGNOSIS — K219 Gastro-esophageal reflux disease without esophagitis: Secondary | ICD-10-CM | POA: Diagnosis not present

## 2021-11-12 DIAGNOSIS — Z79890 Hormone replacement therapy (postmenopausal): Secondary | ICD-10-CM | POA: Diagnosis not present

## 2021-11-12 DIAGNOSIS — Z08 Encounter for follow-up examination after completed treatment for malignant neoplasm: Secondary | ICD-10-CM

## 2021-11-12 DIAGNOSIS — C8331 Diffuse large B-cell lymphoma, lymph nodes of head, face, and neck: Secondary | ICD-10-CM | POA: Insufficient documentation

## 2021-11-12 DIAGNOSIS — E669 Obesity, unspecified: Secondary | ICD-10-CM | POA: Diagnosis not present

## 2021-11-12 DIAGNOSIS — Z8572 Personal history of non-Hodgkin lymphomas: Secondary | ICD-10-CM | POA: Diagnosis not present

## 2021-11-12 DIAGNOSIS — F419 Anxiety disorder, unspecified: Secondary | ICD-10-CM | POA: Diagnosis not present

## 2021-11-12 DIAGNOSIS — M1990 Unspecified osteoarthritis, unspecified site: Secondary | ICD-10-CM | POA: Diagnosis not present

## 2021-11-12 DIAGNOSIS — Z944 Liver transplant status: Secondary | ICD-10-CM | POA: Diagnosis not present

## 2021-11-12 DIAGNOSIS — Z7985 Long-term (current) use of injectable non-insulin antidiabetic drugs: Secondary | ICD-10-CM | POA: Diagnosis not present

## 2021-11-12 DIAGNOSIS — I129 Hypertensive chronic kidney disease with stage 1 through stage 4 chronic kidney disease, or unspecified chronic kidney disease: Secondary | ICD-10-CM | POA: Diagnosis not present

## 2021-11-12 DIAGNOSIS — M797 Fibromyalgia: Secondary | ICD-10-CM | POA: Diagnosis not present

## 2021-11-12 DIAGNOSIS — N921 Excessive and frequent menstruation with irregular cycle: Secondary | ICD-10-CM | POA: Diagnosis present

## 2021-11-12 DIAGNOSIS — Z7984 Long term (current) use of oral hypoglycemic drugs: Secondary | ICD-10-CM | POA: Diagnosis not present

## 2021-11-12 DIAGNOSIS — G4730 Sleep apnea, unspecified: Secondary | ICD-10-CM | POA: Diagnosis not present

## 2021-11-12 DIAGNOSIS — F319 Bipolar disorder, unspecified: Secondary | ICD-10-CM | POA: Diagnosis not present

## 2021-11-12 LAB — COMPREHENSIVE METABOLIC PANEL
ALT: 34 U/L (ref 0–44)
AST: 37 U/L (ref 15–41)
Albumin: 4.2 g/dL (ref 3.5–5.0)
Alkaline Phosphatase: 64 U/L (ref 38–126)
Anion gap: 12 (ref 5–15)
BUN: 29 mg/dL — ABNORMAL HIGH (ref 6–20)
CO2: 21 mmol/L — ABNORMAL LOW (ref 22–32)
Calcium: 9.3 mg/dL (ref 8.9–10.3)
Chloride: 101 mmol/L (ref 98–111)
Creatinine, Ser: 1.1 mg/dL — ABNORMAL HIGH (ref 0.44–1.00)
GFR, Estimated: >60 mL/min (ref >60)
Glucose, Bld: 296 mg/dL — ABNORMAL HIGH (ref 70–99)
Potassium: 4 mmol/L (ref 3.5–5.1)
Sodium: 134 mmol/L — ABNORMAL LOW (ref 135–145)
Total Bilirubin: 0.4 mg/dL (ref 0.3–1.2)
Total Protein: 8.1 g/dL (ref 6.5–8.1)

## 2021-11-12 LAB — CBC WITH DIFFERENTIAL/PLATELET
Abs Immature Granulocytes: 0.04 10*3/uL (ref 0.00–0.07)
Basophils Absolute: 0.1 10*3/uL (ref 0.0–0.1)
Basophils Relative: 1 %
Eosinophils Absolute: 0.3 10*3/uL (ref 0.0–0.5)
Eosinophils Relative: 3 %
HCT: 43.7 % (ref 36.0–46.0)
Hemoglobin: 14.5 g/dL (ref 12.0–15.0)
Immature Granulocytes: 0 %
Lymphocytes Relative: 28 %
Lymphs Abs: 2.7 10*3/uL (ref 0.7–4.0)
MCH: 28.7 pg (ref 26.0–34.0)
MCHC: 33.2 g/dL (ref 30.0–36.0)
MCV: 86.4 fL (ref 80.0–100.0)
Monocytes Absolute: 0.7 10*3/uL (ref 0.1–1.0)
Monocytes Relative: 7 %
Neutro Abs: 6.2 10*3/uL (ref 1.7–7.7)
Neutrophils Relative %: 61 %
Platelets: 204 10*3/uL (ref 150–400)
RBC: 5.06 MIL/uL (ref 3.87–5.11)
RDW: 13.8 % (ref 11.5–15.5)
WBC: 10 10*3/uL (ref 4.0–10.5)
nRBC: 0 % (ref 0.0–0.2)

## 2021-11-12 LAB — TYPE AND SCREEN
ABO/RH(D): O POS
Antibody Screen: NEGATIVE

## 2021-11-12 LAB — LACTATE DEHYDROGENASE: LDH: 119 U/L (ref 98–192)

## 2021-11-12 MED ORDER — HEPARIN SOD (PORK) LOCK FLUSH 100 UNIT/ML IV SOLN
500.0000 [IU] | Freq: Once | INTRAVENOUS | Status: AC
  Administered 2021-11-12: 500 [IU] via INTRAVENOUS
  Filled 2021-11-12: qty 5

## 2021-11-12 MED ORDER — SODIUM CHLORIDE 0.9% FLUSH
10.0000 mL | Freq: Once | INTRAVENOUS | Status: AC
  Administered 2021-11-12: 10 mL via INTRAVENOUS
  Filled 2021-11-12: qty 10

## 2021-11-12 NOTE — Progress Notes (Signed)
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  She is currently on CellCept and tacrolimus for the same.  She also has a history of post transplant lymphoproliferative disorder/DLBCL that was diagnosed in 2016.  She is s/p 6 cycles of R-CHOP chemotherapy back then and was in complete remission 1.  Her other past medical history significant for migraines, stage III chronic kidney disease, chemo-induced peripheral neuropathy, hypertension among other medical problems.  With regards to her diffuse large B-cell lymphoma she was getting surveillance scans and this was all done in [place].  She has now moved to [facility] to be with her significant other.  Patient was noted to have a prior kidney mass before which was biopsied and was not consistent with malignancy.   She underwent CT neck with contrast before she left [place] on 02/22/2019.  She was noted to have soft tissue masses in both the parotid glands which were new as compared to prior exams and possibly represent lymph nodes.  The largest one was seen in the left parotid gland measuring 1.3 x 1.1 cm.  Before  she could get a work-up for this patient moved to [place] and has not had any further work-up yet   Her other prior imaging is as follows: MRI thoracic spine without contrast in August 2019 showed moderate multilevel spondylosis but no evidence of lymphoma.  Multinodular thyroid in June 2019.  MRI cervical spine May 2019 again showed spondylosis but no other acute pathology.  MRI brain showed incidental partial opacification of the right and right sinus.  No evidence of malignancy.  I do not have any other PET CT scan or treatment records from the past.   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  Bone marrow biopsy was negative for lymphoma.  Patient completed 4 cycles of weekly RituxanIn September 2020.  Repeat PET CT scan showed interval resolution of the parotid lesions compatible with complete response to therapy  Interval history- Patient's current weight is 237 pounds as compared to 42 pounds a month ago.  States that her weight loss has been entirely unintentional.  She was also having ongoing symptoms of pain and swelling in her parotid gland area for which she was seen by ENT and will be seen by [facility] as well soon  ECOG PS- 1 Pain scale- 0   Review of systems- Review of Systems  Constitutional:  Positive for malaise/fatigue and weight loss. Negative for chills and fever.  HENT:  Negative for congestion, ear discharge and nosebleeds.   Eyes:  Negative for blurred vision.  Respiratory:  Negative for cough, hemoptysis, sputum production, shortness of breath and wheezing.   Cardiovascular:  Negative for chest pain, palpitations, orthopnea and  claudication.  Gastrointestinal:  Negative for abdominal pain, blood in stool, constipation, diarrhea, heartburn, melena, nausea and vomiting.  Genitourinary:  Negative for dysuria, flank pain, frequency, hematuria and urgency.  Musculoskeletal:  Negative for back pain, joint pain and myalgias.   Skin:  Negative for rash.  Neurological:  Negative for dizziness, tingling, focal weakness, seizures, weakness and headaches.  Endo/Heme/Allergies:  Does not bruise/bleed easily.  Psychiatric/Behavioral:  Negative for depression and suicidal ideas. The patient does not have insomnia.      Allergies  Allergen Reactions   Penicillin G Itching, Rash, Anaphylaxis and Palpitations   Lamictal [Lamotrigine] Rash   Carbamazepine Other (See Comments)    Medication interaction-prograf    Hydrocodone-Acetaminophen Itching   Naproxen Itching     Past Medical History:  Diagnosis Date   Abnormal uterine bleeding    Allergy    Anxiety    Arthritis    Bipolar disorder (manic depression) (HCC)    Chronic kidney failure    Chronic renal disease, stage III (HCC)    Diabetes mellitus without complication (HCC)    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[place]) 2015   Right axillary lymph node resected and chemo tx's.   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HCC)    Lupus (HCC)    Major depressive disorder    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[place]) 06/2019   Chemo tx's   Migraine    Morbid obesity (HCC)    Neuromuscular disorder (HCC)    neuropathy   Neuropathy    Personality disorder (HCC)    Pineal gland cyst    Post herpetic neuralgia    PTSD (post-traumatic stress disorder)    Renal disorder    S/P liver transplant ([place]) 1993   Sleep apnea    has not recieved cpap yet-other one was recalled     Past Surgical History:  Procedure Laterality Date   BONE MARROW BIOPSY  01/13/2015   BREAST BIOPSY  12/2014   BREAST BIOPSY  2011   BREAST SURGERY     CHOLECYSTECTOMY     COLONOSCOPY     ESOPHAGOGASTRODUODENOSCOPY     ESOPHAGOGASTRODUODENOSCOPY (EGD) WITH PROPOFOL N/A 01/09/2021   Procedure:  ESOPHAGOGASTRODUODENOSCOPY (EGD) WITH PROPOFOL;  Surgeon: [name], MD;  Location: [facility] ENDOSCOPY;  Service: Endoscopy;  Laterality: N/A;   HERNIA REPAIR     x4-all from liver transplant   infusaport     LIVER TRANSPLANT  12/17/1991   LUMBAR PUNCTURE     PORT A CATH INJECTION ([facility] HX)     RENAL BIOPSY     tumor removal  2015   cancer right side    Social History   Socioeconomic History   Marital status: Single    Spouse name: Not on file   Number of children: 0   Years of education: 9   Highest education level: GED or equivalent  Occupational History   Occupation: disabled  Tobacco Use   Smoking status: Never   Smokeless tobacco: Never  Vaping Use   Vaping Use: Never used  Substance and Sexual Activity   Alcohol use: Not Currently   Drug use: Not Currently    Types: Marijuana    Comment: pain managment last used in early  april   Sexual activity: Not Currently  Other Topics Concern   Not on file  Social History Narrative   Not on file   Social Determinants of Health   Financial Resource Strain: Low Risk    Difficulty of Paying Living Expenses: Not hard at all  Food Insecurity: No Food Insecurity   Worried About Charity fundraiser in the Last Year: Never true   [place] in the Last Year: Never true  Transportation Needs: No Transportation Needs   Lack of Transportation (Medical): No   Lack of Transportation (Non-Medical): No  Physical Activity: Insufficiently Active   Days of Exercise per Week: 1 day   Minutes of Exercise per Session: 30 min  Stress: No Stress Concern Present   Feeling of Stress : Only a little  Social Connections: Moderately Integrated   Frequency of Communication with Friends and Family: More than three times a week   Frequency of Social Gatherings with Friends and Family: More than three times a week   Attends Religious Services: More than 4 times per year   Active Member of [employer] or Organizations: No   Attends Programme researcher, broadcasting/film/video: Never   Marital Status: Living with partner  Human resources officer Violence: Not At Risk   Fear of Current or Ex-Partner: No   Emotionally Abused: No   Physically Abused: No   Sexually Abused: No    Family History  Problem Relation Age of Onset   Heart disease Mother    Hypertension Mother    Cancer - Other Mother    Bipolar disorder Mother    Heart disease Father    Hypertension Father    Diabetes Father    Parkinson's disease Maternal Grandmother    Cancer Maternal Aunt    Cancer Maternal Uncle    Cancer Maternal Grandfather    Lupus Paternal Grandmother    Hypertension Brother      Current Outpatient Medications:    Atogepant (QULIPTA) 30 MG TABS, Take 30 mg by mouth daily. (Patient taking differently: Take 30 mg by mouth as needed.), Disp: 30 tablet, Rfl: 0   baclofen (LIORESAL) 10 MG tablet, Take 10 mg by mouth 2 (two) times daily., Disp: , Rfl:    benztropine (COGENTIN) 1 MG tablet, Take 1 tablet (1 mg total) by mouth daily as needed for tremors., Disp: 30 tablet, Rfl: 1   cholecalciferol (VITAMIN D3) 25 MCG (1000 UNIT) tablet, Take 1,000 Units by mouth in the morning and at bedtime., Disp: , Rfl:    CRANBERRY PO, Take 1 capsule by mouth in the morning and at bedtime., Disp: , Rfl:    Dulaglutide (TRULICITY) 3 XF/8.1WE SOPN, Inject 3 mg into the skin once a week. Sundays, Disp: , Rfl:    eszopiclone (LUNESTA) 1 MG TABS tablet, TAKE ONE TABLET BY MOUTH AT BEDTIME AS NEEDED FOR SLEEP, Disp: 30 tablet, Rfl: 1   ezetimibe (ZETIA) 10 MG tablet, Take 10 mg by mouth every morning., Disp: , Rfl:    fluticasone (FLONASE) 50 MCG/ACT nasal spray, Place 1 spray into both nostrils daily as needed for allergies., Disp: , Rfl:    glucose blood (COOL BLOOD GLUCOSE TEST STRIPS) test strip, Use as instructed, Disp: 100 each, Rfl: 12   icosapent Ethyl (VASCEPA) 1 g capsule, Take 2 capsules (2 g total) by mouth 2 (two) times daily., Disp: 120 capsule, Rfl: 5   lisinopril  (ZESTRIL) 20 MG tablet, Take 1 tablet (20 mg total) by mouth  daily. (Patient taking differently: Take 20 mg by mouth every morning.), Disp: 90 tablet, Rfl: 3   lurasidone (LATUDA) 20 MG TABS tablet, Take 1 tablet (20 mg total) by mouth daily with supper., Disp: 90 tablet, Rfl: 0   Magnesium 500 MG CAPS, Take 500 mg by mouth in the morning and at bedtime., Disp: , Rfl:    Melatonin 10 MG TABS, Take 10 mg by mouth at bedtime., Disp: , Rfl:    metFORMIN (GLUCOPHAGE) 1000 MG tablet, TAKE ONE TABLET BY MOUTH TWICE DAILY with meals, Disp: 180 tablet, Rfl: 2   MYRBETRIQ 50 MG TB24 tablet, TAKE ONE TABLET BY MOUTH ONCE DAILY (Patient taking differently: 50 mg every morning.), Disp: 90 tablet, Rfl: 0   naloxone (NARCAN) nasal spray 4 mg/0.1 mL, Place 0.4 mg into the nose once., Disp: , Rfl:    oxyCODONE ER (XTAMPZA ER) 13.5 MG C12A, Take 13.5 mg by mouth 2 (two) times daily., Disp: , Rfl:    pregabalin (LYRICA) 200 MG capsule, Take 200 mg by mouth 2 (two) times daily., Disp: , Rfl:    tacrolimus (PROGRAF) 1 MG capsule, Take 1 capsule by mouth once daily (Patient taking differently: Take 1 mg by mouth every morning.), Disp: 30 capsule, Rfl: 0   venlafaxine XR (EFFEXOR-XR) 37.5 MG 24 hr capsule, TAKE ONE CAPSULE BY MOUTH ONCE DAILY WITH BREAKFAST, Disp: 90 capsule, Rfl: 0   XYLIMELTS 550 MG DISK, Take 550 mg by mouth every 8 (eight) hours as needed (dry mouth)., Disp: , Rfl:    medroxyPROGESTERone (PROVERA) 5 MG tablet, Take 1 tablet (5 mg total) by mouth 2 (two) times daily for 7 days, THEN 1 tablet (5 mg total) daily for 21 days. Use for twelve consecutive days every month while on estrogen therapy. (Patient not taking: Reported on 11/08/2021), Disp: 35 tablet, Rfl: 0  Current Facility-Administered Medications:    sulfamethoxazole-trimethoprim (BACTRIM DS) 800-160 MG per tablet 1 tablet, 1 tablet, Oral, Q12H, Hollice Espy, MD, 1 tablet at 01/17/21 1432  Physical exam:  Vitals:   11/12/21 1400 11/12/21  1419  BP: 106/79   Pulse: 87   Resp: 18 18  Temp: 98.6 F (37 C)   TempSrc: Tympanic   SpO2: 96%   Weight: 237 lb (107.5 kg) 237 lb (107.5 kg)  Height: _0  (1.676 m) _1  (1.676 m)   Physical Exam Cardiovascular:     Rate and Rhythm: Normal rate and regular rhythm.     Heart sounds: Normal heart sounds.  Pulmonary:     Effort: Pulmonary effort is normal.     Breath sounds: Normal breath sounds.  Abdominal:     General: Bowel sounds are normal.     Palpations: Abdomen is soft.  Lymphadenopathy:     Comments: No palpable cervical adenopathy  Skin:    General: Skin is warm and dry.  Neurological:     Mental Status: She is alert and oriented to person, place, and time.     CMP Latest Ref Rng & Units 11/12/2021  Glucose 70 - 99 mg/dL 296(H)  BUN 6 - 20 mg/dL 29(H)  Creatinine 0.44 - 1.00 mg/dL 1.10(H)  Sodium 135 - 145 mmol/L 134(L)  Potassium 3.5 - 5.1 mmol/L 4.0  Chloride 98 - 111 mmol/L 101  CO2 22 - 32 mmol/L 21(L)  Calcium 8.9 - 10.3 mg/dL 9.3  Total Protein 6.5 - 8.1 g/dL 8.1  Total Bilirubin 0.3 - 1.2 mg/dL 0.4  Alkaline Phos 38 - 126 U/L 64  AST 15 - 41 U/L 37  ALT 0 - 44 U/L 34   CBC Latest Ref Rng & Units 11/12/2021  WBC 4.0 - 10.5 K/uL 10.0  Hemoglobin 12.0 - 15.0 g/dL 14.5  Hematocrit 36.0 - 46.0 % 43.7  Platelets 150 - 400 K/uL 204      NM PET Image Restag (PS) Skull Base To Thigh  Result Date: 11/06/2021 CLINICAL DATA:  Subsequent treatment strategy for marginal zone lymphoma of the parotid gland. EXAM: NUCLEAR MEDICINE PET SKULL BASE TO THIGH TECHNIQUE: 13.0 mCi F-18 FDG was injected intravenously. Full-ring PET imaging was performed from the skull base to thigh after the radiotracer. CT data was obtained and used for attenuation correction and anatomic localization. Fasting blood glucose: 152 mg/dl COMPARISON:  PET CTs 04/07/2019 and 07/08/2019. FINDINGS: Mediastinal blood pool activity: SUV max 2.87 Liver activity: SUV max 3.83 NECK: No hypermetabolic  neck mass or lymphadenopathy to suggest recurrent lymphoma. Specifically, no recurrent parotid lesions. Incidental CT findings: Scattered parotid calcifications possibly related to treatment or prior inflammatory process. CHEST: No hypermetabolic mediastinal or hilar nodes. No suspicious pulmonary nodules on the CT scan. No hypermetabolic breast masses, supraclavicular or axillary adenopathy. Incidental CT findings: Stable left-sided Port-A-Cath. No atherosclerotic calcifications involving the aorta or coronary arteries. Stable calcified subcarinal nodes. ABDOMEN/PELVIS: No abnormal hypermetabolic activity within the liver, pancreas, adrenal glands, or spleen. No hypermetabolic lymph nodes in the abdomen or pelvis. Incidental CT findings: Status post cholecystectomy. No biliary dilatation. A few scattered calcified hepatic granulomas are noted. Stable scarring changes involving both kidneys. Stable aortic calcifications. Stable surgical changes involving the right abdominal wall with an adjacent stable small spigelian type hernia containing fat. SKELETON: No findings to suggest osseous lymphoma. Incidental CT findings: none IMPRESSION: Negative PET-CT for recurrent lymphoma. Electronically Signed   By: Marijo Sanes M.D.   On: 11/06/2021 08:44   MM Digital Diagnostic Unilat L  Result Date: 10/25/2021 CLINICAL DATA:  Patient recalled from screening for left breast calcifications. EXAM: DIGITAL DIAGNOSTIC UNILATERAL LEFT MAMMOGRAM WITH CAD TECHNIQUE: Left digital diagnostic mammography was performed. Mammographic images were processed with CAD. COMPARISON:  Previous exam(s). ACR Breast Density Category b: There are scattered areas of fibroglandular density. FINDINGS: Magnification views of the left breast demonstrate a 6 mm group of coarse heterogeneous calcifications upper outer left breast posterior depth. IMPRESSION: Suspicious left breast calcifications. RECOMMENDATION: Stereotactic guided core needle biopsy  left breast calcifications. I have discussed the findings and recommendations with the patient. If applicable, a reminder letter will be sent to the patient regarding the next appointment. BI-RADS CATEGORY  4: Suspicious. Electronically Signed   By: Lovey Newcomer M.D.   On: 10/25/2021 15:51  ECHOCARDIOGRAM COMPLETE  Result Date: 10/17/2021    ECHOCARDIOGRAM REPORT   Patient Name:   SHAARON GOLLIDAY Date of Exam: 10/17/2021 Medical Rec #:  [id number]         Height:       66.0 in Accession #:    [id number]        Weight:       243.6 lb Date of Birth:  [birth year]         BSA:          2.175 m Patient Age:    59 years          BP:           120/84 mmHg Patient Gender: F  HR:           68 bpm. Exam Location:  [facility] Procedure: 2D Echo, Cardiac Doppler, Color Doppler, Strain Analysis and            Intracardiac Opacification Agent Indications:    R07.9* Chest pain, unspecified; R01.1 Murmur  History:        Patient has no prior history of Echocardiogram examinations.                 Arrythmias:Prolonged QT, Signs/Symptoms:Chest Pain, Murmur and                 Edema; Risk Factors:Sleep Apnea, Dyslipidemia, Diabetes,                 Hypertension and Non-Smoker.  Sonographer:    Pilar Jarvis RDMS, RVT, RDCS Referring Phys: [id number] BRIAN AGBOR-ETANG IMPRESSIONS  1. Left ventricular ejection fraction, by estimation, is 50 to 55%. The left ventricle has low normal function. The left ventricle has no regional wall motion abnormalities. Left ventricular diastolic parameters were normal. The average left ventricular  global longitudinal strain is -17.6 %. The global longitudinal strain is normal.  2. Right ventricular systolic function is normal. The right ventricular size is normal.  3. The mitral valve is normal in structure. No evidence of mitral valve regurgitation.  4. The aortic valve was not well visualized. Aortic valve regurgitation is not visualized.  5. Aortic dilatation noted. There is mild  dilatation of the aortic root and of the ascending aorta, measuring 39 mm. FINDINGS  Left Ventricle: Left ventricular ejection fraction, by estimation, is 50 to 55%. The left ventricle has low normal function. The left ventricle has no regional wall motion abnormalities. Definity contrast agent was given IV to delineate the left ventricular endocardial borders. The average left ventricular global longitudinal strain is -17.6 %. The global longitudinal strain is normal. The left ventricular internal cavity size was normal in size. There is no left ventricular hypertrophy. Left ventricular diastolic parameters were normal. Right Ventricle: The right ventricular size is normal. No increase in right ventricular wall thickness. Right ventricular systolic function is normal. Left Atrium: Left atrial size was normal in size. Right Atrium: Right atrial size was normal in size. Pericardium: There is no evidence of pericardial effusion. Mitral Valve: The mitral valve is normal in structure. No evidence of mitral valve regurgitation. Tricuspid Valve: The tricuspid valve is normal in structure. Tricuspid valve regurgitation is trivial. Aortic Valve: The aortic valve was not well visualized. Aortic valve regurgitation is not visualized. Aortic valve mean gradient measures 4.0 mmHg. Aortic valve peak gradient measures 7.3 mmHg. Aortic valve area, by VTI measures 3.60 cm. Pulmonic Valve: The pulmonic valve was not well visualized. Pulmonic valve regurgitation is not visualized. Aorta: Aortic dilatation noted. There is mild dilatation of the aortic root and of the ascending aorta, measuring 39 mm. Venous: The inferior vena cava was not well visualized. IAS/Shunts: No atrial level shunt detected by color flow Doppler.  LEFT VENTRICLE PLAX 2D LVIDd:         5.20 cm     Diastology LVIDs:         4.20 cm     LV e' medial:    6.53 cm/s LV PW:         1.00 cm     LV E/e' medial:  12.9 LV IVS:        0.80 cm     LV e' lateral:   9.14  cm/s  LVOT diam:     2.30 cm     LV E/e' lateral: 9.2 LV SV:         105 LV SV Index:   48          2D Longitudinal Strain LVOT Area:     4.15 cm    2D Strain GLS Avg:     -17.6 %  LV Volumes (MOD) LV vol d, MOD A2C: 60.7 ml LV vol d, MOD A4C: 83.0 ml LV vol s, MOD A2C: 28.1 ml LV vol s, MOD A4C: 94.2 ml LV SV MOD A2C:     32.6 ml LV SV MOD A4C:     83.0 ml LV SV MOD BP:      26.3 ml RIGHT VENTRICLE RV Basal diam:  4.00 cm RV S prime:     10.00 cm/s TAPSE (M-mode): 1.8 cm LEFT ATRIUM             Index        RIGHT ATRIUM           Index LA diam:        5.10 cm 2.35 cm/m   RA Area:     15.10 cm LA Vol (A2C):   34.3 ml 15.77 ml/m  RA Volume:   33.50 ml  15.40 ml/m LA Vol (A4C):   71.8 ml 33.02 ml/m LA Biplane Vol: 53.1 ml 24.42 ml/m  AORTIC VALVE                    PULMONIC VALVE AV Area (Vmax):    3.94 cm     PV Vmax:       1.06 m/s AV Area (Vmean):   3.34 cm     PV Peak grad:  4.5 mmHg AV Area (VTI):     3.60 cm AV Vmax:           135.00 cm/s AV Vmean:          93.600 cm/s AV VTI:            0.292 m AV Peak Grad:      7.3 mmHg AV Mean Grad:      4.0 mmHg LVOT Vmax:         128.00 cm/s LVOT Vmean:        75.200 cm/s LVOT VTI:          0.253 m LVOT/AV VTI ratio: 0.87  AORTA Ao Root diam: 3.90 cm Ao Asc diam:  3.90 cm Ao Arch diam: 2.7 cm MITRAL VALVE               TRICUSPID VALVE MV Area (PHT): 3.53 cm    TR Peak grad:   18.3 mmHg MV Decel Time: 215 msec    TR Vmax:        214.00 cm/s MV E velocity: 84.00 cm/s MV A velocity: 71.60 cm/s  SHUNTS MV E/A ratio:  1.17        Systemic VTI:  0.25 m                            Systemic Diam: 2.30 cm [name] MD Electronically signed by [name] MD Signature Date/Time: 10/17/2021/6:48:35 PM    Final    US PELVIC COMPLETE WITH TRANSVAGINAL  Result Date: 10/31/2021 CLINICAL DATA:  Postmenopausal bleeding EXAM: TRANSABDOMINAL AND TRANSVAGINAL ULTRASOUND OF PELVIS DOPPLER ULTRASOUND OF OVARIES TECHNIQUE: Both transabdominal and transvaginal ultrasound  examinations  of the pelvis were performed. Transabdominal technique was performed for global imaging of the pelvis including uterus, ovaries, adnexal regions, and pelvic cul-de-sac. It was necessary to proceed with endovaginal exam following the transabdominal exam to visualize the ovaries and uterus. Color and duplex Doppler ultrasound was utilized to evaluate blood flow to the ovaries. COMPARISON:  CT done on 09/29/2019 FINDINGS: Uterus Measurements: 6.3 x 2.9 x 3.8 cm = volume: 3.7 mL. There is inhomogeneous echogenicity in myometrium. There is 3.3 x 2.3 x 2.8 cm fibroid in the fundus. Endometrium Thickness: 11.7 mm. There is prominence of endometrial stripe within the fundus. There is no demonstrable increased vascularity in the endometrium in color Doppler examination Right ovary Right ovary is not adequately visualized for evaluation. Left ovary Left ovary is not adequately visualized for evaluation. Other findings No abnormal free fluid. IMPRESSION: There is inhomogeneous echogenicity in myometrium with 3.3 cm fibroid. In the transvaginal images, endometrial stripe is prominent measuring 11.7 mm without demonstrable increased vascularity in the color Doppler examination. Short-term follow-up pelvic sonogram or hystero sonogram and MRI if warranted should be considered. Ovaries are not sonographically visualized and not evaluated. Electronically Signed   By: Elmer Picker M.D.   On: 10/31/2021 16:16   MM CLIP PLACEMENT LEFT  Result Date: 11/01/2021 CLINICAL DATA:  Status post stereotactic guided core biopsy of LEFT breast calcifications. EXAM: 3D DIAGNOSTIC LEFT MAMMOGRAM POST STEREOTACTIC BIOPSY COMPARISON:  Previous exam(s). FINDINGS: 3D Mammographic images were obtained following stereotactic guided biopsy of calcifications in the UPPER-OUTER QUADRANT of the LEFT breast. The biopsy marking clip is 2.6 centimeters LATERAL to the residual calcifications. IMPRESSION: X shaped tissue marker clip is  2.6 centimeters LATERAL to the residual calcifications in the UPPER-OUTER QUADRANT the LEFT breast. Final Assessment: Post Procedure Mammograms for Marker Placement Electronically Signed   By: Nolon Nations M.D.   On: 11/01/2021 09:19  MM LT BREAST BX W LOC DEV 1ST LESION IMAGE BX SPEC STEREO GUIDE  Addendum Date: 11/02/2021   ADDENDUM REPORT: 11/02/2021 12:58 ADDENDUM: Pathology revealed FAT NECROSIS WITH CALCIFICATIONS AND OSTEOID FORMATION of the LEFT breast, upper outer quadrant, (x clip). This was found to be concordant by Dr. Nolon Nations. Pathology results were discussed with the patient by telephone. The patient reported doing well after the biopsy with mild tenderness and swelling at the site. Post biopsy instructions and care were reviewed and questions were answered. The patient was encouraged to call [facility] for any additional concerns. My direct phone number was provided. The patient was instructed to return for annual screening mammography in December 2023. Pathology results reported by Terie Purser, RN on 11/02/2021. Electronically Signed   By: Nolon Nations M.D.   On: 11/02/2021 12:58   Addendum Date: 11/01/2021   ADDENDUM REPORT: 11/01/2021 15:21 ADDENDUM: Previous studies performed at [facility] are now available. Multiple studies are dated 04/03/2017 through 01/21/2014. Comparison with these previous exams confirm 2 areas of calcifications in the UPPER-OUTER QUADRANT of the LEFT breast, one of which has now been biopsied. A second, stable group of benign coarse calcifications is seen more superior and anterior to the biopsied calcifications. No biopsy of this group of calcifications is needed. Pathology from stereotactic biopsy is pending. Electronically Signed   By: Nolon Nations M.D.   On: 11/01/2021 15:21   Result Date: 11/02/2021 CLINICAL DATA:  Patient presents for stereotactic guided core biopsy of LEFT breast calcifications. EXAM:  LEFT BREAST STEREOTACTIC CORE NEEDLE BIOPSY COMPARISON:  Previous  exams. FINDINGS: The patient and I discussed the procedure of stereotactic-guided biopsy including benefits and alternatives. We discussed the high likelihood of a successful procedure. We discussed the risks of the procedure including infection, bleeding, tissue injury, clip migration, and inadequate sampling. Informed written consent was given. The usual time out protocol was performed immediately prior to the procedure. Using sterile technique and 1% lidocaine and 1% lidocaine with epinephrine as local anesthetic, under stereotactic guidance, a 9 gauge vacuum assisted device was used to perform core needle biopsy of calcifications in the UPPER-OUTER QUADRANT of the LEFT breast and placement of an X shaped clip using a LATERAL to MEDIAL approach. Specimen radiograph was performed showing calcifications in numerous tissue samples. Specimens with calcifications are identified for pathology. Lesion quadrant: LOWER OUTER QUADRANT LEFT breast At the conclusion of the procedure, X shaped tissue marker clip was deployed into the biopsy cavity. Follow-up 2-view mammogram was performed and dictated separately. IMPRESSION: Stereotactic-guided biopsy of LEFT breast calcifications. No apparent complications. Electronically Signed: By: Nolon Nations M.D. On: 11/01/2021 08:57    Assessment and plan- Patient is a 46 y.o. 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  She is here for routine follow-up  I have reviewed PET CT scan images independentlyAnd discussed findings with the patient which does not show any evidence of active or recurrent lymphoma.  Although patient is losing weight it cannot be entirely attributed to her lymphoma.  I will see her back in 6 months with CBC with differential CMP and LDH for routine visit and  physical exam.  Discussed talking to her primary care doctor about her uncontrolled blood sugars.   Visit Diagnosis 1. Encounter for follow-up surveillance of lymphoma   2. Thrombocytopenia ([place])      Dr. Randa Evens, MD, MPH [facility] [phone] 11/12/2021 4:19 PM

## 2021-11-12 NOTE — Progress Notes (Signed)
Pt has very difficult veins to obtain venous access. Flushed port and obtained labs this afternoon. Pt reports she is having surgery tomm in day surgery. Pt agreeable to leave port accessed over night so she will have venous access tomorrow.Instructed not to get needle or dressing wet.

## 2021-11-13 ENCOUNTER — Other Ambulatory Visit: Payer: Self-pay

## 2021-11-13 ENCOUNTER — Ambulatory Visit: Payer: Medicaid Other | Admitting: Urgent Care

## 2021-11-13 ENCOUNTER — Encounter: Payer: Self-pay | Admitting: Obstetrics and Gynecology

## 2021-11-13 ENCOUNTER — Encounter
Admission: RE | Disposition: A | Discharge status: Home / Self Care | Payer: Self-pay | Source: Home / Self Care | Attending: Obstetrics and Gynecology

## 2021-11-13 ENCOUNTER — Ambulatory Visit
Admission: RE | Admit: 2021-11-13 | Discharge: 2021-11-13 | Disposition: A | Discharge status: Home / Self Care | Payer: Medicaid Other | Attending: Obstetrics and Gynecology | Admitting: Obstetrics and Gynecology

## 2021-11-13 DIAGNOSIS — G4730 Sleep apnea, unspecified: Secondary | ICD-10-CM | POA: Insufficient documentation

## 2021-11-13 DIAGNOSIS — I129 Hypertensive chronic kidney disease with stage 1 through stage 4 chronic kidney disease, or unspecified chronic kidney disease: Secondary | ICD-10-CM | POA: Insufficient documentation

## 2021-11-13 DIAGNOSIS — E785 Hyperlipidemia, unspecified: Secondary | ICD-10-CM | POA: Insufficient documentation

## 2021-11-13 DIAGNOSIS — N840 Polyp of corpus uteri: Secondary | ICD-10-CM | POA: Diagnosis not present

## 2021-11-13 DIAGNOSIS — Z7985 Long-term (current) use of injectable non-insulin antidiabetic drugs: Secondary | ICD-10-CM | POA: Insufficient documentation

## 2021-11-13 DIAGNOSIS — G4733 Obstructive sleep apnea (adult) (pediatric): Secondary | ICD-10-CM | POA: Diagnosis not present

## 2021-11-13 DIAGNOSIS — E669 Obesity, unspecified: Secondary | ICD-10-CM | POA: Insufficient documentation

## 2021-11-13 DIAGNOSIS — Z79890 Hormone replacement therapy (postmenopausal): Secondary | ICD-10-CM | POA: Insufficient documentation

## 2021-11-13 DIAGNOSIS — N920 Excessive and frequent menstruation with regular cycle: Secondary | ICD-10-CM

## 2021-11-13 DIAGNOSIS — Z7984 Long term (current) use of oral hypoglycemic drugs: Secondary | ICD-10-CM | POA: Insufficient documentation

## 2021-11-13 DIAGNOSIS — F319 Bipolar disorder, unspecified: Secondary | ICD-10-CM | POA: Insufficient documentation

## 2021-11-13 DIAGNOSIS — M1990 Unspecified osteoarthritis, unspecified site: Secondary | ICD-10-CM | POA: Insufficient documentation

## 2021-11-13 DIAGNOSIS — M797 Fibromyalgia: Secondary | ICD-10-CM | POA: Insufficient documentation

## 2021-11-13 DIAGNOSIS — N950 Postmenopausal bleeding: Secondary | ICD-10-CM | POA: Diagnosis not present

## 2021-11-13 DIAGNOSIS — N921 Excessive and frequent menstruation with irregular cycle: Secondary | ICD-10-CM

## 2021-11-13 DIAGNOSIS — K219 Gastro-esophageal reflux disease without esophagitis: Secondary | ICD-10-CM | POA: Insufficient documentation

## 2021-11-13 DIAGNOSIS — N183 Chronic kidney disease, stage 3 unspecified: Secondary | ICD-10-CM | POA: Insufficient documentation

## 2021-11-13 DIAGNOSIS — Z944 Liver transplant status: Secondary | ICD-10-CM | POA: Insufficient documentation

## 2021-11-13 DIAGNOSIS — F419 Anxiety disorder, unspecified: Secondary | ICD-10-CM | POA: Insufficient documentation

## 2021-11-13 DIAGNOSIS — M329 Systemic lupus erythematosus, unspecified: Secondary | ICD-10-CM | POA: Insufficient documentation

## 2021-11-13 DIAGNOSIS — E1122 Type 2 diabetes mellitus with diabetic chronic kidney disease: Secondary | ICD-10-CM | POA: Insufficient documentation

## 2021-11-13 HISTORY — PX: HYSTEROSCOPY WITH D & C: SHX1775

## 2021-11-13 LAB — POCT PREGNANCY, URINE: Preg Test, Ur: NEGATIVE

## 2021-11-13 LAB — GLUCOSE, CAPILLARY
Glucose-Capillary: 163 mg/dL — ABNORMAL HIGH (ref 70–99)
Glucose-Capillary: 156 mg/dL — ABNORMAL HIGH (ref 70–99)

## 2021-11-13 SURGERY — DILATATION AND CURETTAGE /HYSTEROSCOPY
Anesthesia: General | Site: Uterus

## 2021-11-13 MED ORDER — ACETAMINOPHEN 10 MG/ML IV SOLN
INTRAVENOUS | Status: DC | PRN
  Administered 2021-11-13: 1000 mg via INTRAVENOUS

## 2021-11-13 MED ORDER — FENTANYL CITRATE (PF) 100 MCG/2ML IJ SOLN
25.0000 ug | INTRAMUSCULAR | Status: DC | PRN
  Administered 2021-11-13 (×2): 25 ug via INTRAVENOUS

## 2021-11-13 MED ORDER — LACTATED RINGERS IV SOLN: INTRAVENOUS | Status: DC

## 2021-11-13 MED ORDER — DEXAMETHASONE SODIUM PHOSPHATE 10 MG/ML IJ SOLN
INTRAMUSCULAR | Status: DC | PRN
  Administered 2021-11-13: 5 mg via INTRAVENOUS

## 2021-11-13 MED ORDER — CHLORHEXIDINE GLUCONATE 0.12 % MT SOLN
15.0000 mL | Freq: Once | OROMUCOSAL | Status: AC
  Administered 2021-11-13: 15 mL via OROMUCOSAL

## 2021-11-13 MED ORDER — HEPARIN SOD (PORK) LOCK FLUSH 100 UNIT/ML IV SOLN
INTRAVENOUS | Status: AC
  Filled 2021-11-13: qty 5

## 2021-11-13 MED ORDER — PROPOFOL 10 MG/ML IV BOLUS
INTRAVENOUS | Status: AC
  Filled 2021-11-13: qty 20

## 2021-11-13 MED ORDER — CHLORHEXIDINE GLUCONATE 0.12 % MT SOLN
OROMUCOSAL | Status: AC
  Filled 2021-11-13: qty 15

## 2021-11-13 MED ORDER — ONDANSETRON HCL 4 MG/2ML IJ SOLN
INTRAMUSCULAR | Status: DC | PRN
  Administered 2021-11-13: 4 mg via INTRAVENOUS

## 2021-11-13 MED ORDER — PROPOFOL 10 MG/ML IV BOLUS
INTRAVENOUS | Status: DC | PRN
  Administered 2021-11-13 (×2): 30 mg via INTRAVENOUS

## 2021-11-13 MED ORDER — PROPOFOL 500 MG/50ML IV EMUL
INTRAVENOUS | Status: DC | PRN
  Administered 2021-11-13: 145 ug/kg/min via INTRAVENOUS

## 2021-11-13 MED ORDER — FENTANYL CITRATE (PF) 100 MCG/2ML IJ SOLN
INTRAMUSCULAR | Status: AC
  Administered 2021-11-13: 25 ug via INTRAVENOUS
  Filled 2021-11-13: qty 2

## 2021-11-13 MED ORDER — 0.9 % SODIUM CHLORIDE (POUR BTL) OPTIME
TOPICAL | Status: DC | PRN
  Administered 2021-11-13: 910 mL

## 2021-11-13 MED ORDER — HEPARIN SOD (PORK) LOCK FLUSH 100 UNIT/ML IV SOLN: 500.0000 [IU] | INTRAVENOUS | Status: DC | PRN

## 2021-11-13 MED ORDER — FAMOTIDINE 20 MG PO TABS
20.0000 mg | ORAL_TABLET | Freq: Once | ORAL | Status: AC
  Administered 2021-11-13: 20 mg via ORAL

## 2021-11-13 MED ORDER — MIDAZOLAM HCL 2 MG/2ML IJ SOLN
INTRAMUSCULAR | Status: DC | PRN
  Administered 2021-11-13: 2 mg via INTRAVENOUS

## 2021-11-13 MED ORDER — FENTANYL CITRATE (PF) 100 MCG/2ML IJ SOLN
INTRAMUSCULAR | Status: DC | PRN
  Administered 2021-11-13 (×5): 25 ug via INTRAVENOUS

## 2021-11-13 MED ORDER — FENTANYL CITRATE (PF) 100 MCG/2ML IJ SOLN
INTRAMUSCULAR | Status: AC
  Filled 2021-11-13: qty 2

## 2021-11-13 MED ORDER — SILVER NITRATE-POT NITRATE 75-25 % EX MISC
CUTANEOUS | Status: AC
  Filled 2021-11-13: qty 10

## 2021-11-13 MED ORDER — MIDAZOLAM HCL 2 MG/2ML IJ SOLN
INTRAMUSCULAR | Status: AC
  Filled 2021-11-13: qty 2

## 2021-11-13 MED ORDER — ACETAMINOPHEN 10 MG/ML IV SOLN: 1000.0000 mg | Freq: Once | INTRAVENOUS | Status: DC | PRN

## 2021-11-13 MED ORDER — DEXMEDETOMIDINE HCL IN NACL 200 MCG/50ML IV SOLN
INTRAVENOUS | Status: DC | PRN
  Administered 2021-11-13 (×2): 4 ug via INTRAVENOUS

## 2021-11-13 MED ORDER — ORAL CARE MOUTH RINSE: 15.0000 mL | Freq: Once | OROMUCOSAL | Status: AC

## 2021-11-13 MED ORDER — POVIDONE-IODINE 10 % EX SWAB: 2.0000 "application " | Freq: Once | CUTANEOUS | Status: DC

## 2021-11-13 MED ORDER — PROPOFOL 500 MG/50ML IV EMUL
INTRAVENOUS | Status: AC
  Filled 2021-11-13: qty 50

## 2021-11-13 MED ORDER — FAMOTIDINE 20 MG PO TABS
ORAL_TABLET | ORAL | Status: AC
  Filled 2021-11-13: qty 1

## 2021-11-13 SURGICAL SUPPLY — 24 items
BACTOSHIELD CHG 4% 4OZ (MISCELLANEOUS) ×1
DEVICE MYOSURE LITE (MISCELLANEOUS) ×1 IMPLANT
DEVICE MYOSURE REACH (MISCELLANEOUS) IMPLANT
DRSG TELFA 3X8 NADH (GAUZE/BANDAGES/DRESSINGS) ×2 IMPLANT
ELECT REM PT RETURN 9FT ADLT (ELECTROSURGICAL)
ELECTRODE REM PT RTRN 9FT ADLT (ELECTROSURGICAL) IMPLANT
GAUZE 4X4 16PLY ~~LOC~~+RFID DBL (SPONGE) ×4 IMPLANT
GLOVE SURG SYN 6.5 ES PF (GLOVE) ×2 IMPLANT
GLOVE SURG SYN 6.5 PF PI (GLOVE) ×1 IMPLANT
GLOVE SURG UNDER POLY LF SZ6.5 (GLOVE) ×4 IMPLANT
GOWN STRL REUS W/ TWL LRG LVL3 (GOWN DISPOSABLE) ×2 IMPLANT
GOWN STRL REUS W/TWL LRG LVL3 (GOWN DISPOSABLE) ×4
IV NS IRRIG 3000ML ARTHROMATIC (IV SOLUTION) ×2 IMPLANT
KIT PROCEDURE FLUENT (KITS) ×1 IMPLANT
MANIFOLD NEPTUNE II (INSTRUMENTS) ×2 IMPLANT
PACK DNC HYST (MISCELLANEOUS) ×2 IMPLANT
PAD DRESSING TELFA 3X8 NADH (GAUZE/BANDAGES/DRESSINGS) IMPLANT
PAD OB MATERNITY 4.3X12.25 (PERSONAL CARE ITEMS) ×2 IMPLANT
PAD PREP 24X41 OB/GYN DISP (PERSONAL CARE ITEMS) ×2 IMPLANT
SCRUB CHG 4% DYNA-HEX 4OZ (MISCELLANEOUS) ×1 IMPLANT
SEAL ROD LENS SCOPE MYOSURE (ABLATOR) ×2 IMPLANT
SET CYSTO W/LG BORE CLAMP LF (SET/KITS/TRAYS/PACK) IMPLANT
TOWEL OR 17X26 4PK STRL BLUE (TOWEL DISPOSABLE) ×2 IMPLANT
WATER STERILE IRR 500ML POUR (IV SOLUTION) ×2 IMPLANT

## 2021-11-13 NOTE — Anesthesia Procedure Notes (Signed)
Procedure Name: General with mask airway Date/Time: 11/13/2021 10:53 AM Performed by: Kelton Pillar, CRNA Pre-anesthesia Checklist: Patient identified, Emergency Drugs available, Suction available and Patient being monitored Patient Re-evaluated:Patient Re-evaluated prior to induction Oxygen Delivery Method: Simple face mask Induction Type: IV induction Placement Confirmation: positive ETCO2 and CO2 detector Dental Injury: Teeth and Oropharynx as per pre-operative assessment

## 2021-11-13 NOTE — Anesthesia Postprocedure Evaluation (Signed)
Anesthesia Post Note  [name]  Procedure(s) Performed: DILATATION AND CURETTAGE /HYSTEROSCOPY (Uterus)  Patient location during evaluation: PACU Anesthesia Type: General Level of consciousness: awake and alert, oriented and patient cooperative Pain management: pain level controlled Vital Signs Assessment: post-procedure vital signs reviewed and stable Respiratory status: spontaneous breathing, nonlabored ventilation and respiratory function stable Cardiovascular status: blood pressure returned to baseline and stable Postop Assessment: adequate PO intake Anesthetic complications: no   No notable events documented.   Last Vitals:  Vitals:   11/13/21 1139 11/13/21 1145  BP:  (!) 122/58  Pulse: (!) 56 (!) 57  Resp: 15 14  Temp:  36.8 C  SpO2: 96% 95%    Last Pain:  Vitals:   11/13/21 1145  TempSrc:   PainSc: [place]

## 2021-11-13 NOTE — Anesthesia Preprocedure Evaluation (Addendum)
Anesthesia Evaluation  Patient identified by MRN, date of birth, ID band Patient awake    Reviewed: Allergy & Precautions, NPO status , Patient's Chart, lab work & pertinent test results  History of Anesthesia Complications Negative for: history of anesthetic complications  Airway Mallampati: I   Neck ROM: Full    Dental  (+) Poor Dentition Missing all but four teeth:   Pulmonary sleep apnea ,    Pulmonary exam normal breath sounds clear to auscultation       Cardiovascular hypertension, Normal cardiovascular exam Rhythm:Regular Rate:Normal  ECG 11/07/21:  Normal sinus rhythm Left axis deviation Right bundle branch block   Neuro/Psych  Headaches, PSYCHIATRIC DISORDERS (PTSD, personality disorder) Anxiety Depression Bipolar Disorder    GI/Hepatic GERD  ,(+) Cirrhosis       , Hepatitis -S/p liver transplant 1993   Endo/Other  diabetes, Type 2Obesity   Renal/GU Renal disease (stage III CKD)     Musculoskeletal  (+) Arthritis , Fibromyalgia -SLE   Abdominal   Peds  Hematology DLBCL   Anesthesia Other Findings Cardiology note 09/07/21:  1. QTC prolongation on EKG. patient has a right bundle branch block, corrected QTC considering right bundle branch block was 392 which is normal.  Patient made aware and reassured.  Okay to continue psychiatric medications as per PCP or behavioral health.   2. Chest pain, risk factors hypertension, hyperlipidemia, obesity, diabetes.  Get echocardiogram, get Lexiscan Myoview to evaluate ischemia. 3. Hypertension, BP controlled.  Continue lisinopril as prescribed. 4. Hyperlipidemia, cholesterol elevated.  Not on statins due to history of liver transplant.  Increase Vascepa to 2 g twice daily, continue Zetia.  Repeat fasting lipid profile in 3 months. 5. Obesity, weight loss recommended.  Reproductive/Obstetrics                            Anesthesia  Physical Anesthesia Plan  ASA: 3  Anesthesia Plan: General   Post-op Pain Management:    Induction: Intravenous  PONV Risk Score and Plan: 3 and Propofol infusion, TIVA and Treatment may vary due to age or medical condition  Airway Management Planned: Natural Airway  Additional Equipment:   Intra-op Plan:   Post-operative Plan:   Informed Consent: I have reviewed the patients History and Physical, chart, labs and discussed the procedure including the risks, benefits and alternatives for the proposed anesthesia with the patient or authorized representative who has indicated his/her understanding and acceptance.       Plan Discussed with: CRNA  Anesthesia Plan Comments: (LMA/GETA backup discussed.  Patient consented for risks of anesthesia including but not limited to:  - adverse reactions to medications - damage to eyes, teeth, lips or other oral mucosa - nerve damage due to positioning  - sore throat or hoarseness - damage to heart, brain, nerves, lungs, other parts of body or loss of life  Informed patient about role of CRNA in peri- and intra-operative care.  Patient voiced understanding.)        Anesthesia Quick Evaluation

## 2021-11-13 NOTE — Transfer of Care (Signed)
Immediate Anesthesia Transfer of Care Note  [name]  Procedure(s) Performed: DILATATION AND CURETTAGE /HYSTEROSCOPY  Patient Location: PACU  Anesthesia Type:General  Level of Consciousness: drowsy and patient cooperative  Airway & Oxygen Therapy: Patient Spontanous Breathing and Patient connected to face mask oxygen  Post-op Assessment: Report given to RN and Post -op Vital signs reviewed and stable  Post vital signs: Reviewed and stable  Last Vitals:  Vitals Value Taken Time  BP    Temp    Pulse 58 11/13/21 1118  Resp 17 11/13/21 1118  SpO2 100 % 11/13/21 1118  Vitals shown include unvalidated device data.  Last Pain:  Vitals:   11/13/21 0903  TempSrc: Temporal  PainSc: 0-No pain         Complications: No notable events documented.

## 2021-11-13 NOTE — Op Note (Signed)
Operative Note  11/13/2021  PRE-OP DIAGNOSIS: Menorrhagia with irregular cycle  POST-OP DIAGNOSIS: same   SURGEON: Tor Tsuda MD  PROCEDURE: Procedure(s): DILATATION AND CURETTAGE /HYSTEROSCOPY   ANESTHESIA: Choice   ESTIMATED BLOOD LOSS: 5 cc   SPECIMENS:  Endometrial curetting  FLUID DEFICIT: minimal  COMPLICATIONS: None  DISPOSITION: PACU - hemodynamically stable.  CONDITION: stable  FINDINGS: Exam under anesthesia revealed  normal uterus with bilateral adnexa without masses or fullness. Hysteroscopy revealed a normal uterine cavity with bilateral tubal ostia and normal appearing endocervical canal.  PROCEDURE IN DETAIL: After informed consent was obtained, the patient was taken to the operating room where anesthesia was obtained without difficulty. The patient was positioned in the dorsal lithotomy position in [place]. The patient's bladder was catheterized with an in and out foley catheter. The patient was examined under anesthesia, with the above noted findings. The weightedspeculum was placed inside the patient's vagina, and the the anterior lip of the cervix was seen and grasped with the tenaculum.  The uterine cavity was sounded to 8 cm, and then the cervix was progressively dilated to a 18 French-Pratt dilator. The 0 degree hysteroscope was introduced, with saline fluid used to distend the intrauterine cavity, with the above noted findings.  The Myosure was used to directly sample the cavity/ Once the cavity was sampled entirely the hysteroscope was removed.   The uterine cavity was curetted until a gritty texture was noted, yielding endometrial curettings. Excellent hemostasis was noted, and all instruments were removed, with excellent hemostasis noted throughout. She was then taken out of dorsal lithotomy. Minimal discrepancy in fluid was noted.  The patient tolerated the procedure well. Sponge, lap and needle counts were correct x2. The patient was taken to  recovery room in excellent condition.  Adrian Prows MD [facility], [facility] 11/13/2021 11:12 AM

## 2021-11-13 NOTE — Discharge Instructions (Signed)

## 2021-11-13 NOTE — Interval H&P Note (Signed)
History and Physical Interval Note:  11/13/2021 9:49 AM  Alison [name]  has presented today for surgery, with the diagnosis of postmenopausal bleeding.  The various methods of treatment have been discussed with the patient and family. After consideration of risks, benefits and other options for treatment, the patient has consented to  Procedure(s): DILATATION AND CURETTAGE /HYSTEROSCOPY (N/A) as a surgical intervention.  The patient's history has been reviewed, patient examined, no change in status, stable for surgery.  I have reviewed the patient's chart and labs.  Questions were answered to the patient's satisfaction.     [place]

## 2021-11-14 LAB — SURGICAL PATHOLOGY

## 2021-11-15 DIAGNOSIS — K1120 Sialoadenitis, unspecified: Secondary | ICD-10-CM | POA: Diagnosis not present

## 2021-11-19 DIAGNOSIS — F3181 Bipolar II disorder: Secondary | ICD-10-CM | POA: Diagnosis not present

## 2021-11-19 DIAGNOSIS — F4310 Post-traumatic stress disorder, unspecified: Secondary | ICD-10-CM | POA: Diagnosis not present

## 2021-11-19 DIAGNOSIS — F411 Generalized anxiety disorder: Secondary | ICD-10-CM | POA: Diagnosis not present

## 2021-11-21 ENCOUNTER — Ambulatory Visit (INDEPENDENT_AMBULATORY_CARE_PROVIDER_SITE_OTHER): Payer: Medicaid Other | Admitting: Obstetrics and Gynecology

## 2021-11-21 ENCOUNTER — Encounter: Payer: Self-pay | Admitting: Obstetrics and Gynecology

## 2021-11-21 ENCOUNTER — Ambulatory Visit: Payer: Medicaid Other | Admitting: Obstetrics and Gynecology

## 2021-11-21 ENCOUNTER — Other Ambulatory Visit: Payer: Self-pay

## 2021-11-21 VITALS — BP 120/60 | Ht 66.0 in | Wt 245.0 lb

## 2021-11-21 DIAGNOSIS — Z4889 Encounter for other specified surgical aftercare: Secondary | ICD-10-CM

## 2021-11-21 NOTE — Progress Notes (Signed)
°  Postoperative Follow-up Patient presents post op from  Hysteroscopy with Dilation and Curettage  for  AUB , 1 week ago.  Subjective: She reports that since the surgery she has had very minimal spotting. Bleeding is improved. She has had some cramping.  Activity: normal activities of daily living.   Objective: BP 120/60    Ht 5' 6"  (1.676 m)    Wt 245 lb (111.1 kg)    BMI 39.54 kg/m  Physical Exam Constitutional:      Appearance: Normal appearance. She is well-developed.  HENT:     Head: Normocephalic and atraumatic.  Eyes:     Extraocular Movements: Extraocular movements intact.     Pupils: Pupils are equal, round, and reactive to light.  Neck:     Thyroid: No thyromegaly.  Cardiovascular:     Rate and Rhythm: Normal rate and regular rhythm.     Heart sounds: Normal heart sounds.  Pulmonary:     Effort: Pulmonary effort is normal.     Breath sounds: Normal breath sounds.  Abdominal:     General: Bowel sounds are normal. There is no distension.     Palpations: Abdomen is soft. There is no mass.  Musculoskeletal:     Cervical back: Neck supple.  Neurological:     Mental Status: She is alert and oriented to person, place, and time.  Skin:    General: Skin is warm and dry.  Psychiatric:        Behavior: Behavior normal.        Thought Content: Thought content normal.        Judgment: Judgment normal.  Vitals reviewed.    Assessment: s/p :  Hysteroscopy with Dilation and Curettage   stable  Plan:  Patient has done well after surgery with no apparent complications.   Dicussed options for IUD or endometrial ablation in the future should bleeding return or become problematic again.   I have discussed the post-operative course to date, and the expected progress moving forward.  The patient understands what complications to be concerned about.  I will see the patient in routine follow up, or sooner if needed.    Activity plan: No restriction.    Adrian Prows  MD, [place], [facility] 11/21/2021 4:47 PM

## 2021-11-22 DIAGNOSIS — M3501 Sicca syndrome with keratoconjunctivitis: Secondary | ICD-10-CM | POA: Diagnosis not present

## 2021-11-23 ENCOUNTER — Encounter: Payer: Self-pay | Admitting: Urology

## 2021-11-23 NOTE — Telephone Encounter (Signed)
I called pt she states she has had dysuria and frequent urination x 4 days. She is schedule to come in Monday to see dr. Matilde Sprang. Pt verbalized understanding.

## 2021-11-26 ENCOUNTER — Ambulatory Visit: Payer: Medicaid Other | Admitting: Urology

## 2021-11-26 ENCOUNTER — Other Ambulatory Visit: Payer: Self-pay

## 2021-11-26 VITALS — BP 109/68 | HR 80 | Ht 66.0 in | Wt 248.0 lb

## 2021-11-26 DIAGNOSIS — N3020 Other chronic cystitis without hematuria: Secondary | ICD-10-CM

## 2021-11-26 DIAGNOSIS — N390 Urinary tract infection, site not specified: Secondary | ICD-10-CM

## 2021-11-26 LAB — MICROSCOPIC EXAMINATION
Epithelial Cells (non renal): >10 /hpf — AB (ref 0–10)
WBC, UA: >30 /hpf — AB (ref 0–5)

## 2021-11-26 LAB — URINALYSIS, COMPLETE
Bilirubin, UA: NEGATIVE
Glucose, UA: NEGATIVE
Ketones, UA: NEGATIVE
Nitrite, UA: NEGATIVE
Protein,UA: NEGATIVE
Specific Gravity, UA: 1.02 (ref 1.005–1.030)
Urobilinogen, Ur: 0.2 mg/dL (ref 0.2–1.0)
pH, UA: 6 (ref 5.0–7.5)

## 2021-11-26 MED ORDER — TRIMETHOPRIM 100 MG PO TABS: 100.0000 mg | ORAL_TABLET | Freq: Every day | ORAL | 11 refills | Status: DC

## 2021-11-26 MED ORDER — NITROFURANTOIN MONOHYD MACRO 100 MG PO CAPS: 100.0000 mg | ORAL_CAPSULE | Freq: Two times a day (BID) | ORAL | 0 refills | Status: DC

## 2021-11-26 NOTE — Progress Notes (Signed)
11/26/2021 10:42 AM   Alison [name] 09/12/1976 [id number]  Referring provider: Bo Merino, [address] [place],  [place] [id number]  Chief Complaint  Patient presents with   Recurrent UTI    HPI: Alison [name]: Treated for urinary tract infections and overactive bladder.  Did well on Myrbetriq but it became expensive.  She has Sjogren syndrome and dry mouth and dry eye.  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  Had a negative cystoscopy.  2 last cultures in medical record negative  Patient said frequency and burning in the last week.  She gets about 4 infections a year that respond favorably antibiotics.  She has issues with QT prolongation.  PMH: Past Medical History:  Diagnosis Date   Abnormal uterine bleeding    Allergy    Anxiety    Arthritis    Bipolar disorder (manic depression) (HCC)    Chronic kidney failure    Chronic renal disease, stage III (HCC)    Diabetes mellitus without complication (HCC)    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[place]) 2015   Right axillary lymph node resected and chemo tx's.   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HCC)    Lupus (HCC)    Major depressive disorder    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[place]) 06/2019   Chemo tx's   Migraine    Morbid obesity (HCC)    Neuromuscular disorder (HCC)    neuropathy   Neuropathy    Personality disorder (HCC)    Pineal gland cyst    Post herpetic neuralgia    PTSD (post-traumatic stress disorder)    Renal disorder    S/P liver transplant ([place]) 1993   Sleep apnea    has not recieved cpap yet-other one was recalled    Surgical History: Past Surgical History:  Procedure Laterality Date   BONE MARROW BIOPSY  01/13/2015   BREAST BIOPSY  12/2014   BREAST BIOPSY   2011   BREAST SURGERY     CHOLECYSTECTOMY     COLONOSCOPY     ESOPHAGOGASTRODUODENOSCOPY     ESOPHAGOGASTRODUODENOSCOPY (EGD) WITH PROPOFOL N/A 01/09/2021   Procedure: ESOPHAGOGASTRODUODENOSCOPY (EGD) WITH PROPOFOL;  Surgeon: [name], MD;  Location: [facility] ENDOSCOPY;  Service: Endoscopy;  Laterality: N/A;   HERNIA REPAIR     x4-all from liver transplant   HYSTEROSCOPY WITH D & C N/A 11/13/2021   Procedure: DILATATION AND CURETTAGE /HYSTEROSCOPY;  Surgeon: Homero Fellers, MD;  Location: [facility] ORS;  Service: Gynecology;  Laterality: N/A;   infusaport     LIVER TRANSPLANT  12/17/1991   LUMBAR PUNCTURE     PORT A CATH INJECTION ([place] HX)     RENAL BIOPSY     tumor removal  2015   cancer right side    Home Medications:  Allergies as of 11/26/2021       Reactions   Penicillin G Itching, Rash, Anaphylaxis, Palpitations   Lamictal [lamotrigine] Rash   Carbamazepine Other (See Comments)   Medication interaction-prograf   Hydrocodone-acetaminophen Itching   Naproxen Itching        Medication List        Accurate as of November 26, 2021 10:42 AM. If you have  any questions, ask your nurse or doctor.          baclofen 10 MG tablet Commonly known as: LIORESAL Take 10 mg by mouth 2 (two) times daily.   benztropine 1 MG tablet Commonly known as: COGENTIN Take 1 tablet (1 mg total) by mouth daily as needed for tremors.   cholecalciferol 25 MCG (1000 UNIT) tablet Commonly known as: VITAMIN D3 Take 1,000 Units by mouth in the morning and at bedtime.   Cool Blood Glucose Test Strips test strip Generic drug: glucose blood Use as instructed   CRANBERRY PO Take 1 capsule by mouth in the morning and at bedtime.   eszopiclone 1 MG Tabs tablet Commonly known as: LUNESTA TAKE ONE TABLET BY MOUTH AT BEDTIME AS NEEDED FOR SLEEP   ezetimibe 10 MG tablet Commonly known as: ZETIA Take 10 mg by mouth every morning.   fluticasone 50 MCG/ACT nasal spray Commonly known as:  FLONASE Place 1 spray into both nostrils daily as needed for allergies.   icosapent Ethyl 1 g capsule Commonly known as: VASCEPA Take 2 capsules (2 g total) by mouth 2 (two) times daily.   lisinopril 20 MG tablet Commonly known as: ZESTRIL Take 1 tablet (20 mg total) by mouth daily. What changed: when to take this   lurasidone 20 MG Tabs tablet Commonly known as: LATUDA Take 1 tablet (20 mg total) by mouth daily with supper.   Magnesium 500 MG Caps Take 500 mg by mouth in the morning and at bedtime.   Melatonin 10 MG Tabs Take 10 mg by mouth at bedtime.   metFORMIN 1000 MG tablet Commonly known as: GLUCOPHAGE TAKE ONE TABLET BY MOUTH TWICE DAILY with meals   Myrbetriq 50 MG Tb24 tablet Generic drug: mirabegron ER TAKE ONE TABLET BY MOUTH ONCE DAILY What changed:  how much to take how to take this when to take this   naloxone 4 MG/0.1ML Liqd nasal spray kit Commonly known as: NARCAN Place 0.4 mg into the nose once.   pregabalin 200 MG capsule Commonly known as: LYRICA Take 200 mg by mouth 2 (two) times daily.   tacrolimus 1 MG capsule Commonly known as: PROGRAF Take 1 capsule by mouth once daily What changed: when to take this   Trulicity 3 JS/9.7WY Sopn Generic drug: Dulaglutide Inject 3 mg into the skin once a week. Sundays   venlafaxine XR 37.5 MG 24 hr capsule Commonly known as: EFFEXOR-XR TAKE ONE CAPSULE BY MOUTH ONCE DAILY WITH BREAKFAST   Xtampza ER 13.5 MG C12a Generic drug: oxyCODONE ER Take 13.5 mg by mouth 2 (two) times daily.   XyliMelts 550 MG Disk Generic drug: Xylitol Take 550 mg by mouth every 8 (eight) hours as needed (dry mouth).        Allergies:  Allergies  Allergen Reactions   Penicillin G Itching, Rash, Anaphylaxis and Palpitations   Lamictal [Lamotrigine] Rash   Carbamazepine Other (See Comments)    Medication interaction-prograf    Hydrocodone-Acetaminophen Itching   Naproxen Itching    Family History: Family  History  Problem Relation Age of Onset   Heart disease Mother    Hypertension Mother    Cancer - Other Mother    Bipolar disorder Mother    Heart disease Father    Hypertension Father    Diabetes Father    Parkinson's disease Maternal Grandmother    Cancer Maternal Aunt    Cancer Maternal Uncle    Cancer Maternal Grandfather    Lupus Paternal  Grandmother    Hypertension Brother     Social History:  reports that she has never smoked. She has never used smokeless tobacco. She reports that she does not currently use alcohol. She reports that she does not currently use drugs after having used the following drugs: Marijuana.  ROS:                                        Physical Exam: There were no vitals taken for this visit.   Laboratory Data: Lab Results  Component Value Date   WBC 10.0 11/12/2021   HGB 14.5 11/12/2021   HCT 43.7 11/12/2021   MCV 86.4 11/12/2021   PLT 204 11/12/2021    Lab Results  Component Value Date   CREATININE 1.10 (H) 11/12/2021    No results found for: PSA  Lab Results  Component Value Date   TESTOSTERONE <3 (L) 05/24/2019    Lab Results  Component Value Date   HGBA1C 8.9 (A) 12/11/2020    Urinalysis    Component Value Date/Time   COLORURINE YELLOW (A) 01/18/2021 1915   APPEARANCEUR CLOUDY (A) 01/18/2021 1915   APPEARANCEUR Cloudy (A) 01/17/2021 1346   LABSPEC 1.019 01/18/2021 1915   PHURINE 6.0 01/18/2021 1915   GLUCOSEU 50 (A) 01/18/2021 1915   HGBUR SMALL (A) 01/18/2021 1915   BILIRUBINUR Negative 08/29/2021 1514   BILIRUBINUR Negative 01/17/2021 1346   [place] 01/18/2021 1915   PROTEINUR Negative 08/29/2021 1514   PROTEINUR >=300 (A) 01/18/2021 1915   UROBILINOGEN 0.2 08/29/2021 1514   NITRITE Negative 08/29/2021 1514   NITRITE NEGATIVE 01/18/2021 1915   LEUKOCYTESUR Negative 08/29/2021 1514   LEUKOCYTESUR LARGE (A) 01/18/2021 1915    Pertinent Imaging:   Assessment & Plan: Urine  looked positive.  I called in Macrodantin 100 mg twice a day for 7 days.  She will then go on trimethoprim 100 mg 3x11.  Pathophysiology described.  Reassess in 3 months  1. Recurrent UTI  - Urinalysis, Complete - CULTURE, URINE COMPREHENSIVE   No follow-ups on file.  Reece Packer, MD  [facility] [address], [place], [place] [address] [phone]

## 2021-11-26 NOTE — Patient Instructions (Signed)
Please start trimethoprim after taking macrobid

## 2021-11-27 ENCOUNTER — Telehealth: Payer: Self-pay

## 2021-11-27 DIAGNOSIS — E7849 Other hyperlipidemia: Secondary | ICD-10-CM | POA: Diagnosis not present

## 2021-11-27 DIAGNOSIS — E669 Obesity, unspecified: Secondary | ICD-10-CM | POA: Diagnosis not present

## 2021-11-27 DIAGNOSIS — I10 Essential (primary) hypertension: Secondary | ICD-10-CM | POA: Diagnosis not present

## 2021-11-27 DIAGNOSIS — E1165 Type 2 diabetes mellitus with hyperglycemia: Secondary | ICD-10-CM | POA: Diagnosis not present

## 2021-11-27 DIAGNOSIS — Z944 Liver transplant status: Secondary | ICD-10-CM | POA: Diagnosis not present

## 2021-11-27 DIAGNOSIS — M797 Fibromyalgia: Secondary | ICD-10-CM | POA: Diagnosis not present

## 2021-11-27 NOTE — Telephone Encounter (Signed)
PA pending on cover my meds for Trimethoprim  KEY: [id number]

## 2021-12-02 LAB — CULTURE, URINE COMPREHENSIVE

## 2021-12-03 ENCOUNTER — Emergency Department
Admission: EM | Admit: 2021-12-03 | Discharge: 2021-12-03 | Disposition: A | Discharge status: Home / Self Care | Payer: Medicaid Other | Attending: Emergency Medicine | Admitting: Emergency Medicine

## 2021-12-03 ENCOUNTER — Other Ambulatory Visit (INDEPENDENT_AMBULATORY_CARE_PROVIDER_SITE_OTHER): Payer: Medicaid Other

## 2021-12-03 ENCOUNTER — Other Ambulatory Visit: Payer: Self-pay

## 2021-12-03 DIAGNOSIS — E782 Mixed hyperlipidemia: Secondary | ICD-10-CM

## 2021-12-03 DIAGNOSIS — E860 Dehydration: Secondary | ICD-10-CM | POA: Diagnosis not present

## 2021-12-03 DIAGNOSIS — R112 Nausea with vomiting, unspecified: Secondary | ICD-10-CM | POA: Insufficient documentation

## 2021-12-03 DIAGNOSIS — A044 Other intestinal Escherichia coli infections: Secondary | ICD-10-CM

## 2021-12-03 DIAGNOSIS — I10 Essential (primary) hypertension: Secondary | ICD-10-CM | POA: Diagnosis not present

## 2021-12-03 DIAGNOSIS — K529 Noninfective gastroenteritis and colitis, unspecified: Secondary | ICD-10-CM | POA: Diagnosis not present

## 2021-12-03 DIAGNOSIS — R197 Diarrhea, unspecified: Secondary | ICD-10-CM | POA: Insufficient documentation

## 2021-12-03 LAB — COMPREHENSIVE METABOLIC PANEL
ALT: 29 U/L (ref 0–44)
AST: 27 U/L (ref 15–41)
Albumin: 4.3 g/dL (ref 3.5–5.0)
Alkaline Phosphatase: 63 U/L (ref 38–126)
Anion gap: 12 (ref 5–15)
BUN: 18 mg/dL (ref 6–20)
CO2: 23 mmol/L (ref 22–32)
Calcium: 10 mg/dL (ref 8.9–10.3)
Chloride: 103 mmol/L (ref 98–111)
Creatinine, Ser: 0.85 mg/dL (ref 0.44–1.00)
GFR, Estimated: >60 mL/min (ref >60)
Glucose, Bld: 161 mg/dL — ABNORMAL HIGH (ref 70–99)
Potassium: 4.3 mmol/L (ref 3.5–5.1)
Sodium: 138 mmol/L (ref 135–145)
Total Bilirubin: 0.8 mg/dL (ref 0.3–1.2)
Total Protein: 8.1 g/dL (ref 6.5–8.1)

## 2021-12-03 LAB — GASTROINTESTINAL PANEL BY PCR, STOOL (REPLACES STOOL CULTURE)

## 2021-12-03 LAB — URINALYSIS, ROUTINE W REFLEX MICROSCOPIC
Bilirubin Urine: NEGATIVE
Glucose, UA: NEGATIVE mg/dL
Hgb urine dipstick: NEGATIVE
Ketones, ur: NEGATIVE mg/dL
Nitrite: NEGATIVE
Protein, ur: 100 mg/dL — AB
Specific Gravity, Urine: 1.012 (ref 1.005–1.030)
pH: 6 (ref 5.0–8.0)

## 2021-12-03 LAB — CBC
HCT: 43.2 % (ref 36.0–46.0)
Hemoglobin: 14.3 g/dL (ref 12.0–15.0)
MCH: 28.4 pg (ref 26.0–34.0)
MCHC: 33.1 g/dL (ref 30.0–36.0)
MCV: 85.7 fL (ref 80.0–100.0)
Platelets: 171 10*3/uL (ref 150–400)
RBC: 5.04 MIL/uL (ref 3.87–5.11)
RDW: 13.8 % (ref 11.5–15.5)
WBC: 8.2 10*3/uL (ref 4.0–10.5)
nRBC: 0 % (ref 0.0–0.2)

## 2021-12-03 LAB — C DIFFICILE QUICK SCREEN W PCR REFLEX
C Diff antigen: POSITIVE — AB
C Diff toxin: NEGATIVE

## 2021-12-03 LAB — LIPASE, BLOOD: Lipase: 48 U/L (ref 11–51)

## 2021-12-03 LAB — CLOSTRIDIUM DIFFICILE BY PCR, REFLEXED: Toxigenic C. Difficile by PCR: NEGATIVE

## 2021-12-03 MED ORDER — SODIUM CHLORIDE 0.9 % IV BOLUS
1000.0000 mL | Freq: Once | INTRAVENOUS | Status: AC
  Administered 2021-12-03: 1000 mL via INTRAVENOUS

## 2021-12-03 MED ORDER — PREGABALIN 200 MG PO CAPS: 200.0000 mg | ORAL_CAPSULE | Freq: Two times a day (BID) | ORAL | 0 refills | Status: AC

## 2021-12-03 MED ORDER — AZITHROMYCIN 500 MG PO TABS: 500.0000 mg | ORAL_TABLET | Freq: Every day | ORAL | 0 refills | Status: AC

## 2021-12-03 MED ORDER — PREGABALIN 75 MG PO CAPS
200.0000 mg | ORAL_CAPSULE | Freq: Once | ORAL | Status: AC
  Administered 2021-12-03: 200 mg via ORAL
  Filled 2021-12-03: qty 1

## 2021-12-03 MED ORDER — METOCLOPRAMIDE HCL 5 MG/ML IJ SOLN
10.0000 mg | Freq: Once | INTRAMUSCULAR | Status: AC
  Administered 2021-12-03: 10 mg via INTRAVENOUS
  Filled 2021-12-03: qty 2

## 2021-12-03 MED ORDER — ONDANSETRON HCL 4 MG/2ML IJ SOLN
4.0000 mg | Freq: Once | INTRAMUSCULAR | Status: AC
  Administered 2021-12-03: 4 mg via INTRAVENOUS
  Filled 2021-12-03: qty 2

## 2021-12-03 MED ORDER — HEPARIN SOD (PORK) LOCK FLUSH 100 UNIT/ML IV SOLN
500.0000 [IU] | Freq: Once | INTRAVENOUS | Status: AC
  Administered 2021-12-03: 500 [IU] via INTRAVENOUS
  Filled 2021-12-03: qty 5

## 2021-12-03 MED ORDER — AZITHROMYCIN 500 MG PO TABS
500.0000 mg | ORAL_TABLET | Freq: Once | ORAL | Status: AC
Start: 2021-12-03 — End: 2021-12-03
  Administered 2021-12-03: 500 mg via ORAL
  Filled 2021-12-03: qty 1

## 2021-12-03 NOTE — ED Notes (Signed)
EDP at [facility] 

## 2021-12-03 NOTE — Discharge Instructions (Signed)
Please seek medical attention for any high fevers, chest pain, shortness of breath, change in behavior, persistent vomiting, bloody stool or any other new or concerning symptoms.  

## 2021-12-03 NOTE — ED Triage Notes (Signed)
Pt comes with c/o diarrhea and possible dehydration since Thursday. Pt states she is also a liver transplant pt.

## 2021-12-03 NOTE — ED Notes (Signed)
MD notified, lab called RN to inform that c-diff was positive on stool sample. Contract precautions initiated.

## 2021-12-03 NOTE — ED Provider Notes (Signed)
° °  [facility] Provider Note    Event Date/Time   First MD Initiated Contact with Patient 12/03/21 1434     (approximate)  History   Chief Complaint: Diarrhea  HPI  Alison [name] is a 46 y.o. female with a past medical history of anxiety, liver transplant 1993, CKD, presents to the emergency department for concerns of dehydration.  According to the patient she has been experiencing episodes of nausea vomiting and diarrhea over the past 4 days.  States that diarrhea has increased in frequency and now occurs every 2 hours or so.  Denies any abdominal pain.  Denies any fever.  No one else sick at home.  Physical Exam   Triage Vital Signs: ED Triage Vitals  Enc Vitals Group     BP 12/03/21 1022 (!) 128/99     Pulse Rate 12/03/21 1022 91     Resp 12/03/21 1022 19     Temp 12/03/21 1022 99 F (37.2 C)     Temp Source 12/03/21 1022 Oral     SpO2 12/03/21 1022 97 %     Weight --      Height --      Head Circumference --      Peak Flow --      Pain Score 12/03/21 1016 5     Pain Loc --      Pain Edu? --      Excl. in [place]? --     Most recent vital signs: Vitals:   12/03/21 1022 12/03/21 1405  BP: (!) 128/99 119/83  Pulse: 91 67  Resp: 19   Temp: 99 F (37.2 C)   SpO2: 97% 97%    General: Awake, no distress.  CV:  Good peripheral perfusion.  Regular rate and rhythm  Resp:  Normal effort.  Equal breath sounds bilaterally.  Abd:  No distention.  Soft, nontender.  No rebound or guarding.   ED Results / Procedures / Treatments    MEDICATIONS ORDERED IN ED: Medications - No data to display   IMPRESSION / MDM / [place] / ED COURSE  I reviewed the triage vital signs and the nursing notes.  Patient presents emergency department for nausea vomiting diarrhea over the past 4 days.  Overall the patient appears well, denies any abdominal pain.  Benign abdominal exam.  Given the patient's frequency of diarrhea she states every 2 hours or so  at home we will check labs, obtain a stool sample to rule out C. difficile and check a bio fire panel to rule out infectious causes of diarrhea.  We will IV hydrate we will treat with IV Zofran.  Given the patient's benign abdomen do not believe CT imaging is needed at this time.  Patient's CBC is normal including a normal white blood cell count.  Stool studies are pending.  Chemistry and lipase are pending.  Patient care signed out to oncoming provider.  FINAL CLINICAL IMPRESSION(S) / ED DIAGNOSES   Diarrhea Dehydration   Note:  This document was prepared using Dragon voice recognition software and may include unintentional dictation errors.   Harvest Dark, MD 12/03/21 1447

## 2021-12-03 NOTE — ED Notes (Signed)
Pt wanted blood drawn from port. First Nurse aware. Did not draw blood in triage.

## 2021-12-03 NOTE — ED Notes (Signed)
Disconnected pt from the monitor, states she needs to use the toilet. Hat provided for sample, pt verbalized understanding. Ambulates by self using a cane.

## 2021-12-03 NOTE — ED Notes (Addendum)
Lab tech called  back to clarify c-diff results, antigen part was positive, but as a whole the test is negative. Toxin portion was negative. MD notified.

## 2021-12-04 ENCOUNTER — Encounter: Payer: Self-pay | Admitting: Nurse Practitioner

## 2021-12-04 LAB — LIPID PANEL
Chol/HDL Ratio: 4.6 ratio — ABNORMAL HIGH (ref 0.0–4.4)
Cholesterol, Total: 173 mg/dL (ref 100–199)
HDL: 38 mg/dL — ABNORMAL LOW (ref >39)
LDL Chol Calc (NIH): 101 mg/dL — ABNORMAL HIGH (ref 0–99)
Triglycerides: 195 mg/dL — ABNORMAL HIGH (ref 0–149)
VLDL Cholesterol Cal: 34 mg/dL (ref 5–40)

## 2021-12-04 NOTE — Progress Notes (Signed)
No polyp seen on hysteroscopy. Clinically undetermined.

## 2021-12-07 ENCOUNTER — Other Ambulatory Visit: Payer: Self-pay

## 2021-12-07 ENCOUNTER — Other Ambulatory Visit: Payer: Self-pay | Admitting: Obstetrics and Gynecology

## 2021-12-07 NOTE — Patient Instructions (Signed)
Hi Alison [name], I am glad you are feeling better, have a nice trip!! ? ?Alison [name] was given information about Medicaid Managed Care team care coordination services as a part of their Healthy [facility] Medicaid benefit. Alison [name] verbally consented to engagement with the [facility] Managed Care team.  ? ?If you are experiencing a medical emergency, please call 911 or report to your local emergency department or urgent care.  ? ?If you have a non-emergency medical problem during routine business hours, please contact your provider's office and ask to speak with a nurse.  ? ?For questions related to your Healthy [facility] health plan, please call: [phone] or visit the homepage here: [email or web address] ? ?If you would like to schedule transportation through your Healthy [facility] plan, please call the following number at least 2 days in advance of your appointment: [phone] ? For information about your ride after you set it up, call Ride Assist at [phone]. Use this number to activate a Will Call pickup, or if your transportation is late for a scheduled pickup. Use this number, too, if you need to make a change or cancel a previously scheduled reservation. ? If you need transportation services right away, call [phone]. The after-hours call center is staffed 24 hours to handle ride assistance and urgent reservation requests (including discharges) 365 days a year. Urgent trips include sick visits, hospital discharge requests and life-sustaining treatment. ? ?Call the [place] at [phone], at any time, 24 hours a day, 7 days a week. If you are in danger or need immediate medical attention call 911. ? ?If you would like help to quit smoking, call 1-800-QUIT-NOW 6298815487) OR Espa?ol: 1-855-D?jelo-Ya 213-828-9207) o para m?s informaci?n haga clic aqu? or Text READY to 200-400 to register via text ? ?Ms. Piggee - following  are the goals we discussed in your visit today:  ? Goals Addressed   ? ?  ?  ?  ?  ? This Visit's Progress  ?  Chronic Pain Managed     ?  12/07/21:  No complaints today from patient-pain is managed ?  ?  Get sugars at goal     ?  Timeframe:  Short-Term Goal ?Priority:  High ?Start Date:                             ?Expected End Date:      ongoing                ? ?Follow Up Date Monthly 01/07/22 ?  ?- call for medicine refill 2 or 3 days before it runs out ?- call if I am sick and can't take my medicine ?- keep a list of all the medicines I take; vitamins and herbals too  ?  ?Why is this important?   ?These steps will help you keep on track with your medicines. ?  ?12/07/21:  patient states she is eating better and trying to exercise when she feels like it-trying to use steps once a day-blood sugars 100-200  ? ?Patient verbalizes understanding of instructions and care plan provided today and agrees to view in [place]. Active MyChart status confirmed with patient.   ? ?The Managed Medicaid care management team will reach out to the patient again over the next 30 days.  ?The  Patient  has been provided with contact information for the Managed Medicaid care management team and has been advised to call with any  health related questions or concerns.  ? ?Alison Raider RN, [name] ?[facility] ?Care Management Coordinator - Managed Medicaid High Risk ?[phone] ?  ?Following is a copy of your plan of care:  ?Care Plan : Chronic Pain (Adult)  ?Updates made by Alison Medicus, RN since 12/07/2021 12:00 AM  ?  ? ?Problem: Chronic Pain Management-fibromyalgia   ?Priority: High  ?Onset Date: 01/22/2021  ?  ? ?Long-Range Goal: Fibromyalgia pain managed-new pain management provider   ?Start Date: 08/14/2020  ?Expected End Date: 03/09/2022  ?Recent Progress: On track  ?Priority: High  ?Note:   ?Current Barriers:  ?Care Coordination needs related to pain management provider.   Patient is experiencing fibromyalgia flair  and is interested in help being connected to a pain management provider. ?Unable to independently secure referral or appointment for pain management provider. ?12/07/21:  No complaints today ? ?Nurse Case Manager Clinical Goal(s):  ?Over the next 45 days, patient will work with [facility] to address needs related to referral for pain management provider and associated care coordination needs. ?Update 09/22/20:  patient is currently seeing Dr. Humphrey Rolls at [place] [facility]. ? ?Interventions:  ?Inter-disciplinary care team collaboration (see longitudinal plan of care) ?Evaluation of current treatment plan related to fibromyalgia  and patient's adherence to plan as established by provider. ?Update 06/15/21:  Patient taking Baclofen again, Effexor added. ?Collaborated with primary care provider regarding recommendations and referral to pain management provider. ?Discussed plans with patient for ongoing care management follow up and provided patient with direct contact information for care management team ?Anticipate pain education program, pain management support as part of pain management referral.      ? ?Patient Goals/Self-Care Activities ?Over the next 45 days, patient will: ? -Attends all scheduled provider appointments ? develop a personal pain management plan with your pain management provider. ? ?Follow Up Plan:  ?The Managed Medicaid care management team will reach out to the patient again over the next 30 day  ? ?Care Plan : Wellness (Adult)  ?Updates made by Alison Medicus, RN since 12/07/2021 12:00 AM  ?  ? ?Problem: Medication Adherence (Wellness)   ?Priority: High  ?Onset Date: 01/22/2021  ?  ? ?Long-Range Goal: Medication Adherence Maintained   ?Start Date: 09/06/2020  ?Expected End Date: 02/04/2022  ?Recent Progress: On track  ?Priority: High  ?Note:   ?Current Barriers:  ?Chronic Disease Management of chronic health conditions-no complaints today.  Patient is trying to eat better and exercise-will follow up  on CPAP ?Nurse Case Manager Clinical Goal(s):  ?Over the next 30 days, patient will work with CM team pharmacist to review current medications. ?Update 01/22/21:  Patient met with Pharmacist and continues to follow. ?Over the next 30 days, patient will meet with Nutritionist and check her blood sugars. ?Over the next 30 days, patient will attend all scheduled appointments. ? ?Interventions:  ?Inter-disciplinary care team collaboration (see longitudinal plan of care) ?Evaluation of current treatment plan and patient's adherence to plan as established by provider. ?Advised patient to contact her PCP for any medication needs. ?Reviewed medications with patient. ?Collaborated with pharmacy regarding medications.  ?Discussed plans with patient for ongoing care management follow up and provided patient with direct contact information for care management team ?Pharmacy referral for medication review. ?Patient given phone number for Healthy [facility] transportation if needed. ?Will notify PCP of need for testing strips and dietician referral. ?Collaborated with PCP for cardiology appointment. ?Collaborated with Care Guide for  dental resources. ?Care guide referral for dental resources.-completed ?Patient Goals/Self-Care Activities ?Over the next 30 days, patient will: ? -Patient will take medications as prescribed. ?Calls pharmacy for medication refills ?Calls provider office for new concerns or questions ? ?Follow Up Plan: The Managed Medicaid care management team will reach out to the patient again over the next 30 days.  ?The patient has been provided with contact information for the Managed Medicaid care management team and has been advised to call with any health related questions or concerns.   ? ?  ?

## 2021-12-07 NOTE — Patient Outreach (Signed)
Medicaid Managed Care   Nurse Care Manager Note  12/07/2021 Name:  Alison [name] MRN:  [id number] DOB:  March 18, 1976  Alison [name] is an 46 y.o. year old female who is a primary patient of Bo Merino, FNP.  The [facility] Managed Care Coordination team was consulted for assistance with:    Chronic healthcare management needs, DM, chronic pain, OSA, migraines, LBP, anxiety/depression/bipolar/PTSD  Alison [name] was given information about Medicaid Managed Care Coordination team services today. [name] Patient agreed to services and verbal consent obtained.  Engaged with patient by telephone for follow up visit in response to provider referral for case management and/or care coordination services.   Assessments/Interventions:  Review of past medical history, allergies, medications, health status, including review of consultants reports, laboratory and other test data, was performed as part of comprehensive evaluation and provision of chronic care management services.  SDOH (Social Determinants of Health) assessments and interventions performed: SDOH Interventions    Flowsheet Row Most Recent Value  SDOH Interventions   Food Insecurity Interventions Intervention Not Indicated  Transportation Interventions Intervention Not Indicated      Care Plan  Allergies  Allergen Reactions   Penicillin G Itching, Rash, Anaphylaxis and Palpitations   Lamictal [Lamotrigine] Rash   Carbamazepine Other (See Comments)    Medication interaction-prograf    Hydrocodone-Acetaminophen Itching   Naproxen Itching   Medications Reviewed Today     Reviewed by [name], RN (Registered Nurse) on 12/07/21 at 1245  Med List Status: <None>   Medication Order Taking? Sig Documenting Provider Last Dose Status Informant  baclofen (LIORESAL) 10 MG tablet [id number] No Take 10 mg by mouth 2 (two) times daily. [provider] Taking Active Self  benztropine (COGENTIN) 1 MG tablet  [id number] No Take 1 tablet (1 mg total) by mouth daily as needed for tremors. [name], MD Taking Active Self  cholecalciferol (VITAMIN D3) 25 MCG (1000 UNIT) tablet [id number] No Take 1,000 Units by mouth in the morning and at bedtime. [provider] Taking Active Self  CRANBERRY PO [id number] No Take 1 capsule by mouth in the morning and at bedtime. [provider] Taking Active Self  Dulaglutide (TRULICITY) 3 KT/6.2BW SOPN [id number] No Inject 3 mg into the skin once a week. Sundays [provider] Taking Active Self  eszopiclone (LUNESTA) 1 MG TABS tablet [id number] No TAKE ONE TABLET BY MOUTH AT BEDTIME AS NEEDED FOR SLEEP [name], [name], MD Taking Active   ezetimibe (ZETIA) 10 MG tablet [id number] No Take 10 mg by mouth every morning. [provider] Taking Active Self  fluticasone (FLONASE) 50 MCG/ACT nasal spray [id number] No Place 1 spray into both nostrils daily as needed for allergies. [provider] Taking Active Self  glucose blood (COOL BLOOD GLUCOSE TEST STRIPS) test strip [id number] No Use as instructed Steele Sizer, MD Taking Active Self  icosapent Ethyl (VASCEPA) 1 g capsule [id number] No Take 2 capsules (2 g total) by mouth 2 (two) times daily. [name], MD Taking Active Self  lisinopril (ZESTRIL) 20 MG tablet [id number] No Take 1 tablet (20 mg total) by mouth daily.  Patient taking differently: Take 20 mg by mouth every morning.   [name], PA-C Taking Active Self  lurasidone (LATUDA) 20 MG TABS tablet [id number] No Take 1 tablet (20 mg total) by mouth daily with supper. [name], MD Taking Active Self  Magnesium 500 MG CAPS [id number] No Take 500 mg by mouth in the morning and at bedtime.  [provider] Taking Active Self  Melatonin 10 MG TABS [id number] No Take 10 mg by mouth at bedtime. [provider] Taking Active Self  metFORMIN (GLUCOPHAGE) 1000 MG tablet [id number] No TAKE ONE TABLET BY  MOUTH TWICE DAILY with meals Bo Merino, FNP Taking Active Self  MYRBETRIQ 50 MG TB24 tablet [id number] No TAKE ONE TABLET BY MOUTH ONCE DAILY  Patient taking differently: 50 mg every morning.   Debroah Loop, PA-C Taking Active Self  naloxone [facility]) nasal spray 4 mg/0.1 mL [id number] No Place 0.4 mg into the nose once. [provider] Taking Active Self  nitrofurantoin, macrocrystal-monohydrate, (MACROBID) 100 MG capsule [id number]  Take 1 capsule (100 mg total) by mouth every 12 (twelve) hours. [name], MD  Active   oxyCODONE ER [facility]) 13.5 MG C12A [id number] No Take 13.5 mg by mouth 2 (two) times daily. [provider] Taking Active Self  pregabalin (LYRICA) 200 MG capsule [id number]  Take 1 capsule (200 mg total) by mouth 2 (two) times daily. [name], MD  Active   tacrolimus (PROGRAF) 1 MG capsule [id number] No Take 1 capsule by mouth once daily  Patient taking differently: Take 1 mg by mouth every morning.   [name], FNP Taking Active Self           Med Note [name], [name]   Fri Jan 19, 2021  5:01 AM)    trimethoprim (TRIMPEX) 100 MG tablet [id number]  Take 1 tablet (100 mg total) by mouth daily. [name], MD  Active   venlafaxine XR (EFFEXOR-XR) 37.5 MG 24 hr capsule [id number] No TAKE ONE CAPSULE BY MOUTH ONCE DAILY WITH BREAKFAST [name], MD Taking Active Self  XYLIMELTS 550 MG DISK [id number] No Take 550 mg by mouth every 8 (eight) hours as needed (dry mouth). 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   Bipolar disorder, in [name] remission, most recent episode depressed ([place]) 09/27/2020   Suprapubic pain 07/25/2020   Bipolar 1 disorder, depressed, mild ([place]) 07/10/2020   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   Edema of lower extremity 09/16/2019   Carpal tunnel syndrome, left 07/26/2019   Cubital tunnel syndrome on left 07/26/2019   Bipolar I disorder, most recent episode depressed ([place]) 07/12/2019   PTSD (post-traumatic stress disorder) 42/70/6237   Umbilical hernia without obstruction and without gangrene 06/17/2019   Encounter for surveillance of abnormal nevi 06/17/2019   Pineal gland cyst 06/17/2019   Chronic hip pain, left 06/02/2019   Lumbar spondylosis 06/02/2019   Mouth dryness 06/02/2019   Obesity (BMI 35.0-39.9 without comorbidity) 06/02/2019   Goals of care, counseling/discussion 05/18/2019   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   Occipital neuralgia 05/13/2019   Plantar fasciitis of left foot 04/27/2019   Fibromyalgia 03/23/2019   Systemic lupus erythematosus (SLE) in adult (  [place]) 03/23/2019   Essential hypertension 03/23/2019   Hepatitis 03/23/2019   Mass of left kidney 03/23/2019   Diabetes mellitus ([place]) 03/23/2019   Nausea without vomiting 03/23/2019   Neuropathy 03/23/2019   Cancer ([place]) 03/23/2019   Hyperlipidemia 03/23/2019   Osteoarthritis 03/23/2019   Trigeminal neuralgia 03/23/2019   Chronic renal disease, stage III ([place]) 03/23/2019   Nephrolithiasis 03/23/2019   Migraine 03/23/2019   OSA on CPAP 03/23/2019   Fibrocystic breast 03/23/2019   Heart murmur 03/23/2019   Bipolar disorder, in partial remission, most recent episode depressed ([place])  07/02/2017   Abnormal uterine bleeding 03/18/2017   Major depressive disorder, recurrent (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   Lumbar disc disease with radiculopathy 07/26/2013   S/P liver transplant ([place]) 07/26/2013   Type 2 diabetes mellitus, uncontrolled 07/26/2013   Facial nerve disorder 06/29/2012   Low back pain 12/26/2010   Conditions to be addressed/monitored per PCP order:  Chronic healthcare management needs, DM, chronic pain, OSA, migraines, LBP, anxiety/depression/bipolar/PTSD, DLBCL  Care Plan : Chronic Pain (Adult)  Updates made by [name], RN since 12/07/2021 12:00 AM     Problem: Chronic Pain Management-fibromyalgia   Priority: High  Onset Date: 01/22/2021     Long-Range Goal: Fibromyalgia pain managed-new pain management provider   Start Date: 08/14/2020  Expected End Date: 03/09/2022  Recent Progress: On track  Priority: High  Note:   Current Barriers:  Care Coordination needs related to pain management provider.   Patient is experiencing fibromyalgia flair and is interested in help being connected to a pain management provider. Unable to independently secure referral or appointment for pain management provider. 12/07/21:  No complaints today  Nurse Case Manager Clinical Goal(s):  Over the next 45 days, patient will work with [facility] to address needs related to referral for pain management provider and associated care coordination needs. Update 09/22/20:  patient is currently seeing Dr. Humphrey Rolls at [place] [facility].  Interventions:  Inter-disciplinary care team collaboration (see longitudinal plan of care) Evaluation of current treatment plan related to fibromyalgia  and patient's adherence to plan as established by provider. Update 06/15/21:  Patient taking  Baclofen again, Effexor added. Collaborated with primary care provider regarding recommendations and referral to pain management provider. Discussed plans with patient for ongoing care management follow up and provided patient with direct contact information for care management team Anticipate pain education program, pain management support as part of pain management referral.       Patient Goals/Self-Care Activities Over the next 45 days, patient will:  -Attends all scheduled provider appointments  develop a personal pain management plan with your pain management provider.  Follow Up Plan:  The Managed Medicaid care management team will reach out to the patient again over the next 30 days.   Care Plan : Wellness (Adult)  Updates made by [name], RN since 12/07/2021 12:00 AM     Problem: Medication Adherence (Wellness)   Priority: High  Onset Date: 01/22/2021     Long-Range Goal: Medication Adherence Maintained   Start Date: 09/06/2020  Expected End Date: 02/04/2022  Recent Progress: On track  Priority: High  Note:   Current Barriers:  Chronic Disease Management of chronic health conditions-no complaints today.  Patient is trying to eat better and exercise-will follow up on CPAP  Nurse Case Manager Clinical Goal(s):  Over the next 30 days, patient will work with CM team pharmacist to review current medications. Update 01/22/21:  Patient met with Pharmacist and continues to follow. Over the next 30 days, patient will meet with Nutritionist and check her blood sugars. Over the next 30 days, patient will attend all scheduled appointments.  Interventions:  Inter-disciplinary care team collaboration (see longitudinal plan of care) Evaluation of current treatment plan and patient's adherence to plan as established by provider. Advised patient to contact her PCP for any medication needs. Reviewed medications with patient. Collaborated with pharmacy regarding medications.  Discussed  plans with patient for ongoing care management follow up and provided patient with direct contact information for care management team Pharmacy referral for medication review. Patient given phone number for [facility] transportation if needed. Will notify PCP of need for testing strips and dietician referral. Collaborated with PCP for cardiology appointment. Collaborated with Care Guide for dental resources. Care guide referral for dental resources.-completed Patient Goals/Self-Care Activities Over the next 30 days, patient will:  -Patient will take medications as prescribed. Calls pharmacy for medication refills Calls provider office for new concerns or questions  Follow Up Plan: The Managed Medicaid care management team will reach out to the patient again over the next 30 days.  The patient has been provided with contact information for the Managed Medicaid care management team and has been advised to call with any health related questions or concerns.    Follow Up:  Patient agrees to Care Plan and Follow-up.  Plan: The Managed Medicaid care management team will reach out to the patient again over the next 30 days. and The  Patient has been provided with contact information for the Managed Medicaid care management team and has been advised to call with any health related questions or concerns.  Date/time of next scheduled RN care management/care coordination outreach:  01/07/22 at 1230

## 2021-12-07 NOTE — Telephone Encounter (Signed)
PA approved.

## 2021-12-10 DIAGNOSIS — M5442 Lumbago with sciatica, left side: Secondary | ICD-10-CM | POA: Diagnosis not present

## 2021-12-10 DIAGNOSIS — M79606 Pain in leg, unspecified: Secondary | ICD-10-CM | POA: Diagnosis not present

## 2021-12-10 DIAGNOSIS — F3189 Other bipolar disorder: Secondary | ICD-10-CM | POA: Diagnosis not present

## 2021-12-10 DIAGNOSIS — G894 Chronic pain syndrome: Secondary | ICD-10-CM | POA: Diagnosis not present

## 2021-12-10 DIAGNOSIS — F411 Generalized anxiety disorder: Secondary | ICD-10-CM | POA: Diagnosis not present

## 2021-12-10 DIAGNOSIS — M79603 Pain in arm, unspecified: Secondary | ICD-10-CM | POA: Diagnosis not present

## 2021-12-10 DIAGNOSIS — F4310 Post-traumatic stress disorder, unspecified: Secondary | ICD-10-CM | POA: Diagnosis not present

## 2021-12-10 DIAGNOSIS — M25519 Pain in unspecified shoulder: Secondary | ICD-10-CM | POA: Diagnosis not present

## 2021-12-10 DIAGNOSIS — G8929 Other chronic pain: Secondary | ICD-10-CM | POA: Diagnosis not present

## 2021-12-10 DIAGNOSIS — F320 Major depressive disorder, single episode, mild: Secondary | ICD-10-CM | POA: Diagnosis not present

## 2021-12-10 DIAGNOSIS — M5414 Radiculopathy, thoracic region: Secondary | ICD-10-CM | POA: Diagnosis not present

## 2021-12-10 DIAGNOSIS — G893 Neoplasm related pain (acute) (chronic): Secondary | ICD-10-CM | POA: Diagnosis not present

## 2021-12-10 DIAGNOSIS — M79669 Pain in unspecified lower leg: Secondary | ICD-10-CM | POA: Diagnosis not present

## 2021-12-10 DIAGNOSIS — F3181 Bipolar II disorder: Secondary | ICD-10-CM | POA: Diagnosis not present

## 2021-12-12 DIAGNOSIS — C8580 Other specified types of non-Hodgkin lymphoma, unspecified site: Secondary | ICD-10-CM | POA: Diagnosis not present

## 2021-12-12 DIAGNOSIS — E1165 Type 2 diabetes mellitus with hyperglycemia: Secondary | ICD-10-CM | POA: Diagnosis not present

## 2021-12-12 DIAGNOSIS — E7849 Other hyperlipidemia: Secondary | ICD-10-CM | POA: Diagnosis not present

## 2021-12-12 DIAGNOSIS — F3131 Bipolar disorder, current episode depressed, mild: Secondary | ICD-10-CM | POA: Diagnosis not present

## 2021-12-12 DIAGNOSIS — K7581 Nonalcoholic steatohepatitis (NASH): Secondary | ICD-10-CM | POA: Diagnosis not present

## 2021-12-12 DIAGNOSIS — Z6837 Body mass index (BMI) 37.0-37.9, adult: Secondary | ICD-10-CM | POA: Diagnosis not present

## 2021-12-12 DIAGNOSIS — I10 Essential (primary) hypertension: Secondary | ICD-10-CM | POA: Diagnosis not present

## 2021-12-12 DIAGNOSIS — R52 Pain, unspecified: Secondary | ICD-10-CM | POA: Diagnosis not present

## 2021-12-12 DIAGNOSIS — F411 Generalized anxiety disorder: Secondary | ICD-10-CM | POA: Diagnosis not present

## 2021-12-12 DIAGNOSIS — M797 Fibromyalgia: Secondary | ICD-10-CM | POA: Diagnosis not present

## 2021-12-12 DIAGNOSIS — E669 Obesity, unspecified: Secondary | ICD-10-CM | POA: Diagnosis not present

## 2021-12-12 DIAGNOSIS — Z944 Liver transplant status: Secondary | ICD-10-CM | POA: Diagnosis not present

## 2021-12-14 ENCOUNTER — Encounter: Payer: Self-pay | Admitting: Nurse Practitioner

## 2021-12-17 ENCOUNTER — Ambulatory Visit: Payer: Medicaid Other | Admitting: Cardiology

## 2021-12-17 ENCOUNTER — Other Ambulatory Visit: Payer: Self-pay

## 2021-12-17 ENCOUNTER — Encounter: Payer: Self-pay | Admitting: Cardiology

## 2021-12-17 VITALS — BP 130/82 | HR 84 | Ht 66.0 in | Wt 241.0 lb

## 2021-12-17 DIAGNOSIS — R072 Precordial pain: Secondary | ICD-10-CM

## 2021-12-17 DIAGNOSIS — E782 Mixed hyperlipidemia: Secondary | ICD-10-CM | POA: Diagnosis not present

## 2021-12-17 DIAGNOSIS — I10 Essential (primary) hypertension: Secondary | ICD-10-CM

## 2021-12-17 DIAGNOSIS — Z6838 Body mass index (BMI) 38.0-38.9, adult: Secondary | ICD-10-CM | POA: Diagnosis not present

## 2021-12-17 NOTE — Patient Instructions (Signed)
Medication Instructions:  ?Your physician recommends that you continue on your current medications as directed. Please refer to the Current Medication list given to you today. ? ?*If you need a refill on your cardiac medications before your next appointment, please call your pharmacy* ? ? ?Lab Work: ? ?Your physician recommends that you return for a FASTING lipid profile: In 5 months ? ?- You will need to be fasting. Please do not have anything to eat or drink after midnight the morning you have the lab work. You may only have water or black coffee with no cream or sugar.  ? ?We will call you closer to your appointment time and schedule you in our office for this lab draw. ? ? ? ?Testing/Procedures: ? ?None ordered ? ? ?Follow-Up: ?At [facility], you and your health needs are our priority.  As part of our continuing mission to provide you with exceptional heart care, we have created designated Provider Care Teams.  These Care Teams include your primary Cardiologist (physician) and Advanced Practice Providers (APPs -  Physician Assistants and Nurse Practitioners) who all work together to provide you with the care you need, when you need it. ? ?We recommend signing up for the patient portal called "MyChart".  Sign up information is provided on this After Visit Summary.  MyChart is used to connect with patients for Virtual Visits (Telemedicine).  Patients are able to view lab/test results, encounter notes, upcoming appointments, etc.  Non-urgent messages can be sent to your provider as well.   ?To learn more about what you can do with MyChart, go to [email or web address].   ? ?Your next appointment:   ?6 month(s) ? ?The format for your next appointment:   ?In Person ? ?Provider:   ?You may see [name], MD or one of the following Advanced Practice Providers on your designated Care Team:   ?Murray Hodgkins, NP ?Christell Faith, PA-C ?Cadence Kathlen Mody, PA-C ? ? ?Other Instructions ? ? ? ?

## 2021-12-17 NOTE — Progress Notes (Signed)
Cardiology Office Note:    Date:  12/17/2021   ID:  Alison [name], DOB 1976/05/07, MRN [id number]  PCP:  Bo Merino, FNP   [name] Providers Cardiologist:  Alison Sable, MD     Referring MD: Alison [name]   Chief Complaint  Patient presents with   Other    3 month follow up -- Meds reviewed verbally with patient.     History of Present Illness:    Alison [name] is a 46 y.o. female with a hx of hypertension, diabetes, obesity, liver transplant 1993 who presents for follow-up.  Previously seen due to QT prolongation and chest pain.  QT prolongation significant arrhythmias secondary to right bundle branch block.  Echocardiogram and Lexiscan Myoview was obtained to evaluate ischemia.  Vascepa was increased to 2 g twice daily to help with cholesterol management.  Chest pain is much improved, denies any further chest pain symptoms.  Has been eating healthier, trying to exercise more, to help lose weight.  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   Chemo tx's   Migraine    Morbid obesity (HCC)    Neuromuscular  disorder (HCC)    neuropathy   Neuropathy    Personality disorder (HCC)    Pineal gland cyst    Post herpetic neuralgia    PTSD (post-traumatic stress disorder)    Renal disorder    S/P liver transplant ([place]) 1993   Sleep apnea    has not recieved cpap yet-other one was recalled    Past Surgical History:  Procedure Laterality Date   BONE MARROW BIOPSY  01/13/2015   BREAST BIOPSY  12/2014   BREAST BIOPSY  2011   BREAST SURGERY     CHOLECYSTECTOMY     COLONOSCOPY     ESOPHAGOGASTRODUODENOSCOPY     ESOPHAGOGASTRODUODENOSCOPY (EGD) WITH PROPOFOL N/A 01/09/2021   Procedure: ESOPHAGOGASTRODUODENOSCOPY (EGD) WITH PROPOFOL;  Surgeon: Alison Lame, MD;  Location: [facility] ENDOSCOPY;  Service: Endoscopy;  Laterality: N/A;   HERNIA REPAIR     x4-all from liver transplant   HYSTEROSCOPY WITH D & C N/A 11/13/2021   Procedure: DILATATION AND CURETTAGE /HYSTEROSCOPY;  Surgeon: Alison Fellers, MD;  Location: [facility] ORS;  Service: Gynecology;  Laterality: N/A;   infusaport     LIVER TRANSPLANT  12/17/1991   LUMBAR PUNCTURE     PORT A CATH INJECTION ([place] HX)     RENAL BIOPSY     tumor removal  2015  cancer right side    Current Medications: Current Meds  Medication Sig   baclofen (LIORESAL) 10 MG tablet Take 10 mg by mouth 2 (two) times daily.   benztropine (COGENTIN) 1 MG tablet Take 1 tablet (1 mg total) by mouth daily as needed for tremors.   cholecalciferol (VITAMIN D3) 25 MCG (1000 UNIT) tablet Take 1,000 Units by mouth in the morning and at bedtime.   CRANBERRY PO Take 1 capsule by mouth in the morning and at bedtime.   Dulaglutide (TRULICITY) 3 SE/8.3TD SOPN Inject 3 mg into the skin once a week. Sundays   eszopiclone (LUNESTA) 1 MG TABS tablet TAKE ONE TABLET BY MOUTH AT BEDTIME AS NEEDED FOR SLEEP   ezetimibe (ZETIA) 10 MG tablet Take 10 mg by mouth every morning.   fluticasone (FLONASE) 50 MCG/ACT nasal spray Place 1 spray into both nostrils daily as needed for allergies.    glucose blood (COOL BLOOD GLUCOSE TEST STRIPS) test strip Use as instructed   icosapent Ethyl (VASCEPA) 1 g capsule Take 2 capsules (2 g total) by mouth 2 (two) times daily.   lisinopril (ZESTRIL) 20 MG tablet Take 1 tablet (20 mg total) by mouth daily.   lurasidone (LATUDA) 20 MG TABS tablet Take 1 tablet (20 mg total) by mouth daily with supper.   Magnesium 500 MG CAPS Take 500 mg by mouth in the morning and at bedtime.   Melatonin 10 MG TABS Take 10 mg by mouth at bedtime.   metFORMIN (GLUCOPHAGE) 1000 MG tablet TAKE ONE TABLET BY MOUTH TWICE DAILY with meals   MYRBETRIQ 50 MG TB24 tablet TAKE ONE TABLET BY MOUTH ONCE DAILY   naloxone (NARCAN) nasal spray 4 mg/0.1 mL Place 0.4 mg into the nose once.   nitrofurantoin, macrocrystal-monohydrate, (MACROBID) 100 MG capsule Take 1 capsule (100 mg total) by mouth every 12 (twelve) hours.   oxyCODONE ER (XTAMPZA ER) 13.5 MG C12A Take 13.5 mg by mouth 2 (two) times daily.   pregabalin (LYRICA) 200 MG capsule Take 1 capsule (200 mg total) by mouth 2 (two) times daily.   tacrolimus (PROGRAF) 1 MG capsule Take 1 capsule by mouth once daily   trimethoprim (TRIMPEX) 100 MG tablet Take 1 tablet (100 mg total) by mouth daily.   venlafaxine XR (EFFEXOR-XR) 37.5 MG 24 hr capsule TAKE ONE CAPSULE BY MOUTH ONCE DAILY WITH BREAKFAST   XYLIMELTS 550 MG DISK Take 550 mg by mouth every 8 (eight) hours as needed (dry mouth).     Allergies:   Penicillin g, Lamictal [lamotrigine], Carbamazepine, Hydrocodone-acetaminophen, and Naproxen   Social History   Socioeconomic History   Marital status: Single    Spouse name: Not on file   Number of children: 0   Years of education: 9   Highest education level: GED or equivalent  Occupational History   Occupation: disabled  Tobacco Use   Smoking status: Never   Smokeless tobacco: Never  Vaping Use   Vaping Use: Never used  Substance and Sexual Activity   Alcohol use: Not Currently   Drug use: Not Currently     Types: Marijuana    Comment: pain managment last used in early april   Sexual activity: Not Currently  Other Topics Concern   Not on file  Social History Narrative   Not on file   Social Determinants of Health   Financial Resource Strain: Low Risk    Difficulty of Paying Living Expenses: Not hard at all  Food Insecurity: No Food Insecurity  Worried About Charity fundraiser in the Last Year: Never true   [place] in the Last Year: Never true  Transportation Needs: No Transportation Needs   Lack of Transportation (Medical): No   Lack of Transportation (Non-Medical): No  Physical Activity: Insufficiently Active   Days of Exercise per Week: 1 day   Minutes of Exercise per Session: 30 min  Stress: No Stress Concern Present   Feeling of Stress : Only a little  Social Connections: Moderately Integrated   Frequency of Communication with Friends and Family: More than three times a week   Frequency of Social Gatherings with Friends and Family: More than three times a week   Attends Religious Services: More than 4 times per year   Active Member of [employer] or Organizations: No   Attends Music therapist: Never   Marital Status: Living with partner     Family History: The patient's family history includes Bipolar disorder in her mother; Cancer in her maternal aunt, maternal grandfather, and maternal uncle; Cancer - Other in her mother; Diabetes in her father; Heart disease in her father and mother; Hypertension in her brother, father, and mother; Lupus in her paternal grandmother; Parkinson's disease in her maternal grandmother.  ROS:   Please see the history of present illness.     All other systems reviewed and are negative.  EKGs/Labs/Other Studies Reviewed:    The following studies were reviewed today:   EKG:  EKG is  ordered today.  The ekg ordered today demonstrates sinus rhythm, right bundle branch block  Recent Labs: 12/03/2021: ALT 29; BUN 18; Creatinine,  Ser 0.85; Hemoglobin 14.3; Platelets 171; Potassium 4.3; Sodium 138  Recent Lipid Panel    Component Value Date/Time   CHOL 173 12/03/2021 0804   TRIG 195 (H) 12/03/2021 0804   HDL 38 (L) 12/03/2021 0804   CHOLHDL 4.6 (H) 12/03/2021 0804   CHOLHDL 5.8 (H) 12/11/2020 0000   LDLCALC 101 (H) 12/03/2021 0804   LDLCALC 133 (H) 12/11/2020 0000     Risk Assessment/Calculations:          Physical Exam:    VS:  BP 130/82 (BP Location: Left Arm, Patient Position: Sitting, Cuff Size: Normal)    Pulse 84    Ht 5' 6"  (1.676 m)    Wt 241 lb (109.3 kg)    SpO2 98%    BMI 38.90 kg/m     Wt Readings from Last 3 Encounters:  12/17/21 241 lb (109.3 kg)  11/26/21 248 lb (112.5 kg)  11/21/21 245 lb (111.1 kg)     GEN:  Well nourished, well developed in no acute distress HEENT: Normal NECK: No JVD; No carotid bruits LYMPHATICS: No lymphadenopathy CARDIAC: RRR, no murmurs, rubs, gallops RESPIRATORY:  Clear to auscultation without rales, wheezing or rhonchi  ABDOMEN: Soft, non-tender, non-distended MUSCULOSKELETAL:  No edema; No deformity  SKIN: Warm and dry NEUROLOGIC:  Alert and oriented x 3 PSYCHIATRIC:  Normal affect   ASSESSMENT:    1. Precordial pain   2. Primary hypertension   3. Mixed hyperlipidemia   4. BMI 38.0-38.9,adult     PLAN:    In order of problems listed above:  Chest pain, echo with EF 50 to 46%, diastolic function are normal.  Lexiscan Myoview with no ischemia, no significant coronary calcification, low risk. Hypertension, BP controlled.  Continue lisinopril  Hyperlipidemia, cholesterol elevated.  Not on statins due to history of liver transplant.  Continue Vascepa 2 g twice daily,  continue Zetia.  Lipid panels still elevated but improving.  Repeat lipid panel in 5 months. Obesity, lost 7 pounds in the past month, congratulated and advised to continue low calorie diet, exercise.    Follow-up in 6 months      Medication Adjustments/Labs and Tests  Ordered: Current medicines are reviewed at length with the patient today.  Concerns regarding medicines are outlined above.  Orders Placed This Encounter  Procedures   Lipid panel   EKG 12-Lead    No orders of the defined types were placed in this encounter.    Patient Instructions  Medication Instructions:  Your physician recommends that you continue on your current medications as directed. Please refer to the Current Medication list given to you today.  *If you need a refill on your cardiac medications before your next appointment, please call your pharmacy*   Lab Work:  Your physician recommends that you return for a FASTING lipid profile: In 5 months  - You will need to be fasting. Please do not have anything to eat or drink after midnight the morning you have the lab work. You may only have water or black coffee with no cream or sugar.   We will call you closer to your appointment time and schedule you in our office for this lab draw.    Testing/Procedures:  None ordered   Follow-Up: At [facility], you and your health needs are our priority.  As part of our continuing mission to provide you with exceptional heart care, we have created designated Provider Care Teams.  These Care Teams include your primary Cardiologist (physician) and Advanced Practice Providers (APPs -  Physician Assistants and Nurse Practitioners) who all work together to provide you with the care you need, when you need it.  We recommend signing up for the patient portal called "MyChart".  Sign up information is provided on this After Visit Summary.  MyChart is used to connect with patients for Virtual Visits (Telemedicine).  Patients are able to view lab/test results, encounter notes, upcoming appointments, etc.  Non-urgent messages can be sent to your provider as well.   To learn more about what you can do with MyChart, go to [email or web address].    Your next appointment:   6 month(s)  The  format for your next appointment:   In Person  Provider:   You may see Alison Sable, MD or one of the following Advanced Practice Providers on your designated Care Team:   Murray Hodgkins, NP Christell Faith, PA-C Cadence Kathlen Mody, [place]   Other Instructions      Signed, Alison Sable, MD  12/17/2021 12:30 PM    [place]

## 2021-12-18 ENCOUNTER — Ambulatory Visit: Payer: Medicaid Other

## 2021-12-18 ENCOUNTER — Other Ambulatory Visit: Payer: Self-pay | Admitting: Pharmacist

## 2021-12-18 DIAGNOSIS — E1165 Type 2 diabetes mellitus with hyperglycemia: Secondary | ICD-10-CM

## 2021-12-18 NOTE — Patient Outreach (Signed)
? ?   ? ?Chief Complaint  ?Patient presents with  ? High Risk Managed Medicaid  ? Diabetes  ? Hyperlipidemia  ? ? ?Alison [name] is a 46 y.o. year old female who was referred for medication management by their primary care provider, Bo Merino, FNP. They presented for a telephonic visit with the Managed Medicaid team ?  ?Referred for pharmacy support for chronic disease states including diabetes, hypertension, hyperlipidemia, bipolar disorder  ? ?Subjective: ?Diabetes: ? ?Current medications: metformin 1000 mg twice daily, Trulicity 4.5 mg weekly - recently increased by endocrinology Dr. Ronnald Collum ?Medications tried in the past:  ? ?Reports most recent A1c at endocrinology was 7.4%.  ? ?Current glucose readings: 120-240s; fasting <130; post prandial: generally <180 ? ?Current meal patterns: reports she doesn't always eat multiple meals a day, pending hunger ?- Breakfast: sometimes oatmeal; sometimes toast; [employer] brand protein shake  ?- Lunch: protein shake; sometimes sandwich, vegetarian patty w/ a sandwich; wheat;  ?- Supper: spaghetti, pizza, if husband cooks; tacos, burritos;  ?- Snacks: fruit (blackberries, blueberries, banana); tries to avoid chips, cookies; low fat yogurt ?- Drinks: sometimes caffeine in the afternoon ? ?Current physical activity: started new regimen; stair climbing (lives on a 3rd floor apartment, does the stairs at least once daily) ? ?Hypertension: ? ?Current medications: lisinopril 20 mg daily ? ?Current blood pressure readings readings: does not check at home, but does have a machine ? ?Hyperlipidemia/ASCVD Risk Reduction ? ?Current lipid lowering medications: ezetimibe 10 mg daily, Vascepa 2 g twice daily ?Medications tried in the past:  ?- History of liver transplant - per cardiology documentation not on statin due to history of liver transplant, though 12/22 saw Transplant Hematology and noted "NASH Cirrhosis: Biopsy proven in 12/2020. Compensated with G1EV  (01/2021)" ? ?Depression/Anxiety with Bipolar, Insomnia as well as Chronic Pain: ? ?Current medications:  ?- Mood: venlafaxine 37.5 mg daily, Latuda 20 mg daily,  ?- Antipsychotic side effect management: benztropine 1 mg PRN - has not needed recently ?- Chronic pain: oxycodone ER 13.5 mg twice daily per Pain Management; pregabalin 200 mg twice daily; baclofen1 10 mg daily ?- Insomnia: Lunesta 1 mg QPM, melatonin 10 mg QPM ?- Reports she does have a script for Narcan at home ? ?Reports she is able to fall asleep, but has a hard time staying asleep. Does endorse afternoon caffeine periodically. Denies naps. Endorses screen time before bed - keeps computer on rolling tray near her bed. ? ?OAB and UTI Prevention; ?Current medications: Myrbetriq 50 mg daily, trimethoprim 100 mg daily  ? ? ? ?Objective: ?Lab Results  ?Component Value Date  ? HGBA1C 8.9 (A) 12/11/2020  ? ? ?Lab Results  ?Component Value Date  ? CREATININE 0.85 12/03/2021  ? BUN 18 12/03/2021  ? NA 138 12/03/2021  ? K 4.3 12/03/2021  ? CL 103 12/03/2021  ? CO2 23 12/03/2021  ? ? ?Lab Results  ?Component Value Date  ? CHOL 173 12/03/2021  ? HDL 38 (L) 12/03/2021  ? LDLCALC 101 (H) 12/03/2021  ? TRIG 195 (H) 12/03/2021  ? CHOLHDL 4.6 (H) 12/03/2021  ? ? ?Medications Reviewed Today   ? ? Reviewed by De Hollingshead, RPH-CPP (Pharmacist) on 12/18/21 at 1106  Med List Status: <None>  ? ?Medication Order Taking? Sig Documenting Provider Last Dose Status Informant  ?baclofen (LIORESAL) 10 MG tablet [id number] Yes Take 10 mg by mouth 2 (two) times daily. [provider] Taking Active Self  ?benztropine (COGENTIN) 1 MG tablet [id number] Yes  Take 1 tablet (1 mg total) by mouth daily as needed for tremors. [name], MD Taking Active Self  ?cholecalciferol (VITAMIN D3) 25 MCG (1000 UNIT) tablet [id number] Yes Take 1,000 Units by mouth in the morning and at bedtime. [provider] Taking Active Self  ?CRANBERRY PO [id number] Yes Take 1 capsule  by mouth in the morning and at bedtime. [provider] Taking Active Self  ?Dulaglutide 4.5 MG/0.5ML SOPN [id number] Yes Inject 4.5 mg into the skin once a week. Sundays [provider] Taking Active Self  ?eszopiclone (LUNESTA) 1 MG TABS tablet [id number] Yes TAKE ONE TABLET BY MOUTH AT BEDTIME AS NEEDED FOR SLEEP [name], [name], MD Taking Active   ?ezetimibe (ZETIA) 10 MG tablet [id number] Yes Take 10 mg by mouth every morning. [provider] Taking Active Self  ?fluticasone (FLONASE) 50 MCG/ACT nasal spray [id number] Yes Place 1 spray into both nostrils daily as needed for allergies. [provider] Taking Active Self  ?glucose blood (COOL BLOOD GLUCOSE TEST STRIPS) test strip [id number] Yes Use as instructed Steele Sizer, MD Taking Active Self  ?icosapent Ethyl (VASCEPA) 1 g capsule [id number] Yes Take 2 capsules (2 g total) by mouth 2 (two) times daily. [name], MD Taking Active Self  ?lisinopril (ZESTRIL) 20 MG tablet [id number] Yes Take 1 tablet (20 mg total) by mouth daily. [name], PA-C Taking Active Self  ?lurasidone (LATUDA) 20 MG TABS tablet [id number] Yes Take 1 tablet (20 mg total) by mouth daily with supper. [name], MD Taking Active Self  ?Magnesium 500 MG CAPS [id number] Yes Take 500 mg by mouth in the morning and at bedtime. [provider] Taking Active Self  ?Melatonin 10 MG TABS [id number] Yes Take 10 mg by mouth at bedtime. [provider] Taking Active Self  ?metFORMIN (GLUCOPHAGE) 1000 MG tablet [id number] Yes TAKE ONE TABLET BY MOUTH TWICE DAILY with meals Bo Merino, FNP Taking Active Self  ?MYRBETRIQ 50 MG TB24 tablet [id number] Yes TAKE ONE TABLET BY MOUTH ONCE DAILY Vaillancourt, Samantha, PA-C Taking Active Self  ?naloxone (NARCAN) nasal spray 4 mg/0.1 mL [id number] No Place 0.4 mg into the nose once.  ?Patient not taking: Reported on 12/18/2021  ? [provider] Not Taking Active Self  ?oxyCODONE ER  [facility] ER) 13.5 MG C12A [id number] Yes Take 13.5 mg by mouth 2 (two) times daily. [provider] Taking Active Self  ?pregabalin (LYRICA) 200 MG capsule [id number] Yes Take 1 capsule (200 mg total) by mouth 2 (two) times daily. [name], MD Taking Active   ?tacrolimus (PROGRAF) 1 MG capsule [id number] Yes Take 1 capsule by mouth once daily [name], FNP Taking Active Self  ?         ?Med Note [name], [name]   Fri Jan 19, 2021  5:01 AM)    ?trimethoprim (TRIMPEX) 100 MG tablet [id number] Yes Take 1 tablet (100 mg total) by mouth daily. [name], MD Taking Active   ?venlafaxine XR (EFFEXOR-XR) 37.5 MG 24 hr capsule [id number] Yes TAKE ONE CAPSULE BY MOUTH ONCE DAILY WITH BREAKFAST [name], MD Taking Active Self  ?XYLIMELTS 550 MG DISK [id number] Yes Take 550 mg by mouth every 8 (eight) hours as needed (dry mouth). [provider] Taking Active Self  ? ?  ?  ? ?  ? ?Care Plan : Medication Management  ?Updates made by De Hollingshead, RPH-CPP since 12/18/2021 12:00 AM  ?  ? ? ?Problem: Type 2 Diabetes   ?  ? ?  Long-Range Goal: A1c <7%   ?Start Date: 12/18/2021  ?This Visit's Progress: On track  ?Priority: High  ?Note:   ?Current Barriers:  ?Unable to achieve control of diabetes  ? ?Patient Needs: ?none ? ?Patient Activities: ?Patient will:  ?- take medications as prescribed as evidenced by patient report and record review ?check glucose twice daily, document, and provide at future appointments ? ?  ? ?Problem: ASCVD Risk Reduction/Hypercholesterolemia   ?  ? ?Long-Range Goal: LDL <70   ?Start Date: 12/18/2021  ?Note:   ?Current Barriers:  ?Unable to achieve control of cholesterol due to drug-disease interactions  ? ?Patient Needs: ?Continued collaboration with cardiology ? ?Patient Activities: ?Patient will:  ?- take medications as prescribed as evidenced by patient report and record review ?check blood pressure periodically, document, and provide at future appointments ? ?   ? ?Problem: Insonmia   ?  ? ?Goal: Improve Sleep Latency (time spent asleep)   ?Note:   ?Current Barriers:  ?Unable to stay asleep through the night ? ?Patient Needs: ?Improved understanding of healthy sleep hygiene hab

## 2021-12-21 ENCOUNTER — Telehealth: Payer: Self-pay | Admitting: Pharmacist

## 2021-12-21 DIAGNOSIS — E1165 Type 2 diabetes mellitus with hyperglycemia: Secondary | ICD-10-CM

## 2021-12-21 NOTE — Patient Outreach (Signed)
Care Coordination ? ?12/21/2021 ? ?Alison [name] ?06-Apr-1976 ?[id number] ? ?Received message back from Dr. Garen Lah that he would support use of statins or PCSK9i, pending preference from Liver Transplant and/or GI team.  ? Kandice Robinsons [facility] Liver Transplant [facility] [phone]), given # for Shelly Flatten, NP with Dr. Manuella Ghazi. She noted that the GI/transplant team is in favor of use of statins in GI post transplant patients that are stable.  ? ?Called patient back, reviewed history of rosuvastatin. She does not remember being on the medication and definitely doesn't recall any issues.  ? ?Recommend initiation of rosuvastatin 5 mg daily. Counseled patient on risk of muscle related side effects and need for liver enzyme testing in 6-12 weeks after initiation. She is agreeable. Will route to cardiology to order medication and labs.  ? ?Catie Darnelle Maffucci, PharmD, BCACP ?[facility] ? ?

## 2021-12-22 DIAGNOSIS — G4733 Obstructive sleep apnea (adult) (pediatric): Secondary | ICD-10-CM | POA: Diagnosis not present

## 2021-12-24 ENCOUNTER — Other Ambulatory Visit: Payer: Self-pay

## 2021-12-24 MED ORDER — ROSUVASTATIN CALCIUM 10 MG PO TABS: 10.0000 mg | ORAL_TABLET | Freq: Every day | ORAL | 3 refills | Status: DC

## 2021-12-24 NOTE — Addendum Note (Signed)
Addended by: De Hollingshead on: 12/24/2021 08:41 AM ? ? Modules accepted: Orders ? ?

## 2021-12-24 NOTE — Patient Instructions (Addendum)
Hi Alison [name],  ? ?Per Dr. Garen Lah, start rosuvastatin 10 mg daily. You can take this at any time of the day.  ? ?Please call [facility] at [phone]  to schedule a lab visit in 8 weeks.  ? ?Take care! ?Catie Darnelle Maffucci, PharmD ?[phone] ?

## 2021-12-24 NOTE — Patient Outreach (Signed)
Care Coordination ? ?12/24/2021 ? ?[name] ?26-Nov-1975 ?[id number] ? ?Per Dr. Garen Lah, order placed for rosuvastatin 10 mg daily, along with liver function enzymes and lipid panel in 8 weeks.  ? ?Contacted patient. Provided counseling on rosuvastatin. Script sent to pharmacy, she will call [facility] to schedule labs in 8 weeks.  ? ?Catie Darnelle Maffucci, PharmD, BCACP ?[facility] ? ?

## 2021-12-24 NOTE — Addendum Note (Signed)
Addended by: De Hollingshead on: 12/24/2021 09:21 AM ? ? Modules accepted: Orders ? ?

## 2021-12-25 ENCOUNTER — Telehealth: Payer: Self-pay | Admitting: Psychiatry

## 2021-12-25 ENCOUNTER — Other Ambulatory Visit: Payer: Self-pay | Admitting: Physician Assistant

## 2021-12-25 ENCOUNTER — Other Ambulatory Visit: Payer: Self-pay | Admitting: Psychiatry

## 2021-12-25 DIAGNOSIS — F3178 Bipolar disorder, in full remission, most recent episode mixed: Secondary | ICD-10-CM

## 2021-12-25 DIAGNOSIS — N3281 Overactive bladder: Secondary | ICD-10-CM

## 2021-12-25 DIAGNOSIS — F3161 Bipolar disorder, current episode mixed, mild: Secondary | ICD-10-CM

## 2021-12-25 DIAGNOSIS — F4310 Post-traumatic stress disorder, unspecified: Secondary | ICD-10-CM

## 2021-12-25 MED ORDER — LURASIDONE HCL 20 MG PO TABS: 20.0000 mg | ORAL_TABLET | Freq: Every day | ORAL | 0 refills | Status: DC

## 2021-12-25 NOTE — Telephone Encounter (Signed)
I have some Latuda and venlafaxine to pharmacy. ?

## 2021-12-31 ENCOUNTER — Telehealth: Payer: Self-pay | Admitting: Nurse Practitioner

## 2021-12-31 NOTE — Telephone Encounter (Signed)
Medication Refill - Medication: Qulipta 30 MG tablets ? ?Has the patient contacted their pharmacy? Yes.    ? ?(Agent: If yes, when and what did the pharmacy advise?) 1st request was faxed on 12/25/2021 and the escribed on 12/31/2021  ? ?Preferred Pharmacy (with phone number or street name):  ?[facility] - [place], [place] - [address] Phone:  [phone]  ?Fax:  [phone]  ?  ? ? ? ?Has the patient been seen for an appointment in the last year OR does the patient have an upcoming appointment? Yes.   ? ?Agent: Please be advised that RX refills may take up to 3 business days. We ask that you follow-up with your pharmacy. ?

## 2022-01-01 NOTE — Telephone Encounter (Signed)
Please review for refill. Medication not on current medication list.  ?

## 2022-01-02 ENCOUNTER — Other Ambulatory Visit: Payer: Self-pay | Admitting: Emergency Medicine

## 2022-01-02 DIAGNOSIS — F411 Generalized anxiety disorder: Secondary | ICD-10-CM | POA: Diagnosis not present

## 2022-01-02 DIAGNOSIS — F4310 Post-traumatic stress disorder, unspecified: Secondary | ICD-10-CM | POA: Diagnosis not present

## 2022-01-02 DIAGNOSIS — F3181 Bipolar II disorder: Secondary | ICD-10-CM | POA: Diagnosis not present

## 2022-01-02 NOTE — Telephone Encounter (Signed)
Script sent to pharmacy.

## 2022-01-07 ENCOUNTER — Other Ambulatory Visit: Payer: Self-pay | Admitting: Obstetrics and Gynecology

## 2022-01-07 ENCOUNTER — Encounter: Payer: Self-pay | Admitting: Cardiology

## 2022-01-07 DIAGNOSIS — F320 Major depressive disorder, single episode, mild: Secondary | ICD-10-CM | POA: Diagnosis not present

## 2022-01-07 DIAGNOSIS — I10 Essential (primary) hypertension: Secondary | ICD-10-CM | POA: Diagnosis not present

## 2022-01-07 DIAGNOSIS — M5442 Lumbago with sciatica, left side: Secondary | ICD-10-CM | POA: Diagnosis not present

## 2022-01-07 DIAGNOSIS — F3189 Other bipolar disorder: Secondary | ICD-10-CM | POA: Diagnosis not present

## 2022-01-07 DIAGNOSIS — M79606 Pain in leg, unspecified: Secondary | ICD-10-CM | POA: Diagnosis not present

## 2022-01-07 DIAGNOSIS — M25519 Pain in unspecified shoulder: Secondary | ICD-10-CM | POA: Diagnosis not present

## 2022-01-07 DIAGNOSIS — G8929 Other chronic pain: Secondary | ICD-10-CM | POA: Diagnosis not present

## 2022-01-07 DIAGNOSIS — G893 Neoplasm related pain (acute) (chronic): Secondary | ICD-10-CM | POA: Diagnosis not present

## 2022-01-07 DIAGNOSIS — M5414 Radiculopathy, thoracic region: Secondary | ICD-10-CM | POA: Diagnosis not present

## 2022-01-07 DIAGNOSIS — M79603 Pain in arm, unspecified: Secondary | ICD-10-CM | POA: Diagnosis not present

## 2022-01-07 DIAGNOSIS — G894 Chronic pain syndrome: Secondary | ICD-10-CM | POA: Diagnosis not present

## 2022-01-07 NOTE — Patient Outreach (Signed)
?Medicaid Managed Care   ?Nurse Care Manager Note ? ?01/07/2022 ?Name:  Alison [name] MRN:  [id number] DOB:  01/07/1976 ? ?Alison [name] is an 46 y.o. year old female who is a primary patient of Alison Merino, FNP.  The [facility] Managed Care Coordination team was consulted for assistance with:    ?Chronic healthcare management needs, DM, chronic pain, OSA, migrainesanxiety, depression , fibromyalgia, prior liver transplant, post transplant lymphoma ? ?Alison [name] was given information about Medicaid Managed Care Coordination team services today. Alison [name] Patient agreed to services and verbal consent obtained. ? ?Engaged with patient by telephone for follow up visit in response to provider referral for case management and/or care coordination services.  ? ?Assessments/Interventions:  Review of past medical history, allergies, medications, health status, including review of consultants reports, laboratory and other test data, was performed as part of comprehensive evaluation and provision of chronic care management services. ? ?SDOH (Social Determinants of Health) assessments and interventions performed: ?SDOH Interventions   ? ?Flowsheet Row Most Recent Value  ?SDOH Interventions   ?Financial Strain Interventions Intervention Not Indicated  ?Physical Activity Interventions Intervention Not Indicated  [patient walks 200 steps a day and climbs steps]  ? ?  ? ?Care Plan ? ?Allergies  ?Allergen Reactions  ? Penicillin G Itching, Rash, Anaphylaxis and Palpitations  ? Lamictal [Lamotrigine] Rash  ? Carbamazepine Other (See Comments)  ?  Medication interaction-prograf ?  ? Hydrocodone-Acetaminophen Itching  ? Naproxen Itching  ? ?Medications Reviewed Today   ? ? Reviewed by [name], RN (Registered Nurse) on 01/07/22 at 25  Med List Status: <None>  ? ?Medication Order Taking? Sig Documenting Provider Last Dose Status Informant  ?Accu-Chek FastClix Lancets MISC [id number]  Apply topically 2 (two) times  daily. [provider]  Active   ?baclofen (LIORESAL) 10 MG tablet [id number] No Take 10 mg by mouth 2 (two) times daily. [provider] Taking Active Self  ?benztropine (COGENTIN) 1 MG tablet [id number] No Take 1 tablet (1 mg total) by mouth daily as needed for tremors. [name], MD Taking Active Self  ?cholecalciferol (VITAMIN D3) 25 MCG (1000 UNIT) tablet [id number] No Take 1,000 Units by mouth in the morning and at bedtime. [provider] Taking Active Self  ?CRANBERRY PO [id number] No Take 1 capsule by mouth in the morning and at bedtime. [provider] Taking Active Self  ?Dulaglutide 4.5 MG/0.5ML SOPN [id number] No Inject 4.5 mg into the skin once a week. Sundays [provider] Taking Active Self  ?eszopiclone (LUNESTA) 1 MG TABS tablet [id number] No TAKE ONE TABLET BY MOUTH AT BEDTIME AS NEEDED FOR SLEEP [name], [name], MD Taking Active   ?ezetimibe (ZETIA) 10 MG tablet [id number] No Take 10 mg by mouth every morning. [provider] Taking Active Self  ?fluticasone (FLONASE) 50 MCG/ACT nasal spray [id number] No Place 1 spray into both nostrils daily as needed for allergies. [provider] Taking Active Self  ?glucose blood (COOL BLOOD GLUCOSE TEST STRIPS) test strip [id number] No Use as instructed Steele Sizer, MD Taking Active Self  ?icosapent Ethyl (VASCEPA) 1 g capsule [id number] No Take 2 capsules (2 g total) by mouth 2 (two) times daily. Alison Sable, MD Taking Active Self  ?lisinopril (ZESTRIL) 20 MG tablet [id number] No Take 1 tablet (20 mg total) by mouth daily. [name], PA-C Taking Active Self  ?lurasidone (LATUDA) 20 MG TABS tablet [id number]  Take 1 tablet (20 mg total) by mouth daily with supper. [name],  [name], MD  Active   ?Magnesium 500 MG CAPS [id number] No Take 500 mg by mouth in the morning and at bedtime. [provider] Taking Active Self  ?Melatonin 10 MG TABS [id number] No Take 10 mg by mouth at  bedtime. [provider] Taking Active Self  ?metFORMIN (GLUCOPHAGE) 1000 MG tablet [id number] No TAKE ONE TABLET BY MOUTH TWICE DAILY with meals Alison Merino, FNP Taking Active Self  ?MYRBETRIQ 50 MG TB24 tablet [id number] No TAKE ONE TABLET BY MOUTH ONCE DAILY Vaillancourt, Samantha, PA-C Taking Active Self  ?naloxone (NARCAN) nasal spray 4 mg/0.1 mL [id number] No Place 0.4 mg into the nose once.  ?Patient not taking: Reported on 12/18/2021  ? [provider] Not Taking Active Self  ?oxyCODONE ER (XTAMPZA ER) 13.5 MG C12A [id number] No Take 13.5 mg by mouth 2 (two) times daily. [provider] Taking Active Self  ?pregabalin (LYRICA) 200 MG capsule [id number] No Take 1 capsule (200 mg total) by mouth 2 (two) times daily. Alison Pear, MD Taking Expired 01/02/22 2359   ?rosuvastatin (CRESTOR) 10 MG tablet [id number]  Take 1 tablet (10 mg total) by mouth daily. Alison Sable, MD  Active   ?tacrolimus (PROGRAF) 1 MG capsule [id number] No Take 1 capsule by mouth once daily Alison Hartshorn, FNP Taking Active Self  ?         ?Med Note Alison [name], Alison [name]   Fri Jan 19, 2021  5:01 AM)    ?trimethoprim (TRIMPEX) 100 MG tablet [id number] No Take 1 tablet (100 mg total) by mouth daily. Alison Loser, MD Taking Active   ?TRULICITY 3 WL/7.9GX SOPN [id number]  4.5 mg. [provider]  Active   ?venlafaxine XR (EFFEXOR-XR) 37.5 MG 24 hr capsule [id number]  TAKE ONE CAPSULE BY MOUTH ONCE DAILY WITH BREAKFAST [name], MD  Active   ?XYLIMELTS 550 MG DISK [id number] No Take 550 mg by mouth every 8 (eight) hours as needed (dry mouth). [provider] Taking Active Self  ? ?  ?  ? ?  ? ?Patient Active Problem List  ? Diagnosis Date Noted  ? Menorrhagia with irregular cycle   ? Akathisia 10/24/2021  ? At risk for prolonged QT interval syndrome 09/12/2021  ? Prolonged Q-T interval on ECG 08/29/2021  ? Insomnia 07/20/2021  ? Bipolar disorder, in full remission, most recent episode mixed  ([place]) 07/20/2021  ? Bipolar 1 disorder, mixed, mild ([place]) 05/09/2021  ? GAD (generalized anxiety disorder) 05/09/2021  ? Hepatic cirrhosis ([place])   ? Opioid dependence with opioid-induced disorder ([place]) 12/20/2020  ? 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 Bipolar disorder, in full remission, most recent episode depressed ([place]) 09/27/2020  ? Suprapubic pain 07/25/2020  ? Bipolar 1 disorder, depressed, mild ([place]) 07/10/2020  ? 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 Edema of lower extremity 09/16/2019  ? Carpal tunnel syndrome, left 07/26/2019  ? Cubital tunnel syndrome on left 07/26/2019  ? Bipolar I disorder, most recent episode depressed ([place]) 07/12/2019  ? PTSD (post-traumatic stress disorder) 07/12/2019  ? Umbilical hernia without obstruction and without gangrene 06/17/2019  ? Encounter for surveillance of abnormal nevi 06/17/2019  ? Pineal gland cyst 06/17/2019  ? Chronic hip pain, left 06/02/2019  ? Lumbar spondylosis 06/02/2019  ? Mouth dryness 06/02/2019  ? Obesity (BMI 35.0-39.9 without comorbidity) 06/02/2019  ? Goals of care, counseling/discussion 05/18/2019  ? 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  Occipital neuralgia 05/13/2019  ? Plantar fasciitis of left foot 04/27/2019  ? Fibromyalgia 03/23/2019  ? Systemic lupus erythematosus (SLE) in adult [facility]) 03/23/2019  ? Essential hypertension 03/23/2019  ? Hepatitis 03/23/2019  ? Mass of left kidney 03/23/2019  ? Diabetes mellitus ([place]) 03/23/2019  ? Nausea without vomiting 03/23/2019  ? Neuropathy 03/23/2019  ? Cancer ([place]) 03/23/2019  ? Hyperlipidemia 03/23/2019  ? Osteoarthritis 03/23/2019  ? Trigeminal neuralgia 03/23/2019  ? Chronic renal disease, stage III ([place]) 03/23/2019  ? Nephrolithiasis 03/23/2019  ? Migraine 03/23/2019  ? OSA on CPAP 03/23/2019  ? Fibrocystic breast 03/23/2019  ? Heart  murmur 03/23/2019  ? Bipolar disorder, in partial remission, most recent episode depressed ([place]) 07/02/2017  ? Abnormal uterine bleeding 03/18/2017  ? Major depressive disorder, recurrent ([place]) 03/18/2017  ?

## 2022-01-07 NOTE — Patient Instructions (Signed)
Hi Alison [name] will follow up with you with Pharmacist phone number when I have it.  Good luck with the sewing!! ? ?Alison [name] was given information about Medicaid Managed Care team care coordination services as a part of their Healthy [facility] Medicaid benefit. Alison [name] verbally consented to engagement with the [facility] Managed Care team.  ? ?If you are experiencing a medical emergency, please call 911 or report to your local emergency department or urgent care.  ? ?If you have a non-emergency medical problem during routine business hours, please contact your provider's office and ask to speak with a nurse.  ? ?For questions related to your Healthy [facility] health plan, please call: [phone] or visit the homepage here: [email or web address] ? ?If you would like to schedule transportation through your Healthy [facility] plan, please call the following number at least 2 days in advance of your appointment: [phone] ? For information about your ride after you set it up, call Ride Assist at [phone]. Use this number to activate a Will Call pickup, or if your transportation is late for a scheduled pickup. Use this number, too, if you need to make a change or cancel a previously scheduled reservation. ? If you need transportation services right away, call [phone]. The after-hours call center is staffed 24 hours to handle ride assistance and urgent reservation requests (including discharges) 365 days a year. Urgent trips include sick visits, hospital discharge requests and life-sustaining treatment. ? ?Call the [place] at [phone], at any time, 24 hours a day, 7 days a week. If you are in danger or need immediate medical attention call 911. ? ?If you would like help to quit smoking, call 1-800-QUIT-NOW 775 090 2412) OR Espa?ol: 1-855-D?jelo-Ya 5816093778) o para m?s informaci?n haga clic aqu? or Text READY to 200-400 to  register via text ? ?Alison [name] - following are the goals we discussed in your visit today:  ? Goals Addressed   ? ?  ?  ?  ?  ? This Visit's Progress  ?  Chronic Pain Managed     ?  01/07/22:  patient with some hip pain since started walking.  Has appt with pain management 02/04/22  ?  Get sugars at goal     ?  Timeframe:  Short-Term Goal ?Priority:  High ?Start Date:                             ?Expected End Date:      ongoing                ? ?Follow Up Date Monthly 02/06/22 ?  ?- call for medicine refill 2 or 3 days before it runs out ?- call if I am sick and can't take my medicine ?- keep a list of all the medicines I take; vitamins and herbals too  ?  ?Why is this important?   ?These steps will help you keep on track with your medicines. ?  ?12/07/21:  blood sugars 126-205 per patient-checking once a day at random times.  Has lost 4 pounds and is walking 200 steps a day and climbing stairs ?  ? ?Patient verbalizes understanding of instructions and care plan provided today and agrees to view in [facility]. Active MyChart status confirmed with patient.   ? ?The Managed Medicaid care management team will reach out to the patient again over the next 30 days.  ?The  Patient  has been  provided with contact information for the Managed Medicaid care management team and has been advised to call with any health related questions or concerns.  ? ?Alison Raider RN, [name] ?[facility] ?Care Management Coordinator - Managed Medicaid High Risk ?[phone] ? ?Following is a copy of your plan of care:  ?Care Plan : Chronic Pain (Adult)  ?Updates made by Alison Medicus, RN since 01/07/2022 12:00 AM  ?  ? ?Problem: Chronic Pain Management-fibromyalgia   ?Priority: High  ?Onset Date: 01/22/2021  ?  ? ?Long-Range Goal: Fibromyalgia pain managed-new pain management provider   ?Start Date: 08/14/2020  ?Expected End Date: 03/09/2022  ?Recent Progress: On track  ?Priority: High  ?Note:   ?Current Barriers:  ?Knowledge  deficits related to pain management  ?01/07/22:  Overall, pain is stable.  Is having some hip pain since starting walking-scheduled to see pain management 02/04/22. ? ?Nurse Case Manager Clinical Goal(s):  ?Over the next 45 days, patient will work with [facility] to address needs related to referral for pain management provider and associated care coordination needs. ?Update 09/22/20:  patient is currently seeing Dr. Humphrey Rolls at [facility]. ? ?Interventions:  ?Inter-disciplinary care team collaboration (see longitudinal plan of care) ?Evaluation of current treatment plan related to fibromyalgia  and patient's adherence to plan as established by provider. ?Update 06/15/21:  Patient taking Baclofen again, Effexor added. ?Collaborated with primary care provider regarding recommendations and referral to pain management provider. ?Discussed plans with patient for ongoing care management follow up and provided patient with direct contact information for care management team ?Anticipate pain education program, pain management support as part of pain management referral.      ? ?Patient Goals/Self-Care Activities ?Over the next 45 days, patient will: ? -Attends all scheduled provider appointments ? develop a personal pain management plan with your pain management provider. ? ?Follow Up Plan:  ?The Managed Medicaid care management team will reach out to the patient again over the next 30 days  ? ?Care Plan : Wellness (Adult)  ?Updates made by Alison Medicus, RN since 01/07/2022 12:00 AM  ?  ? ?Problem: Medication Adherence (Wellness)   ?Priority: High  ?Onset Date: 01/22/2021  ?  ? ?Long-Range Goal: Medication Adherence Maintained   ?Start Date: 09/06/2020  ?Expected End Date: 02/04/2022  ?Recent Progress: On track  ?Priority: High  ?Note:   ?Current Barriers:  ?Chronic Disease Management of chronic health conditions ?  01/07/22: Patient is trying to eat better and exercise-will follow up on CPAP.  Is walking 200 steps a day  and climbing steps-has lost another 4 pounds.  Blood sugars 126-205 ?Nurse Case Manager Clinical Goal(s):  ?Over the next 30 days, patient will work with CM team pharmacist to review current medications. ?Update 01/22/21:  Patient met with Pharmacist and continues to follow. ?Over the next 30 days, patient will meet with Nutritionist and check her blood sugars. ?Over the next 30 days, patient will attend all scheduled appointments. ? ?Interventions:  ?Inter-disciplinary care team collaboration (see longitudinal plan of care) ?Evaluation of current treatment plan and patient's adherence to plan as established by provider. ?Advised patient to contact her PCP for any medication needs. ?Reviewed medications with patient. ?Collaborated with pharmacy regarding medications.  ?Discussed plans with patient for ongoing care management follow up and provided patient with direct contact information for care management team ?Pharmacy referral for medication review. ?Patient given phone number for [facility] transportation if needed. ?Will notify  PCP of need for testing strips and dietician referral. ?Collaborated with PCP for cardiology appointment. ?Collaborated with Care Guide for dental resources. ?Care guide referral for dental resources.-completed ?Patient Goals/Self-Care Activities ?Over the next 30 days, patient will: ? -Patient will take medications as prescribed. ?Calls pharmacy for medication refills ?Calls provider office for new concerns or questions ? ?Follow Up Plan: The Managed Medicaid care management team will reach out to the patient again over the next 30 days.  ?The patient has been provided with contact information for the Managed Medicaid care management team and has been advised to call with any health related questions or concerns.   ? ?  ?

## 2022-01-08 ENCOUNTER — Other Ambulatory Visit: Payer: Self-pay | Admitting: Obstetrics and Gynecology

## 2022-01-08 NOTE — Patient Outreach (Signed)
Care Coordination ? ?01/08/2022 ? ?[name] ?12/26/1975 ?[id number] ? ?RNCM followed up with patient at her request to provide phone number for Managed Medicaid Pharmacist.  Patient provided with phone number, [phone]. ? ?[name], [name] ?[facility] ?Care Management Coordinator - Managed Medicaid High Risk ?[phone] ? ?

## 2022-01-10 ENCOUNTER — Encounter: Payer: Self-pay | Admitting: Nurse Practitioner

## 2022-01-11 ENCOUNTER — Encounter: Payer: Self-pay | Admitting: Nurse Practitioner

## 2022-01-14 ENCOUNTER — Other Ambulatory Visit: Payer: Self-pay | Admitting: Psychiatry

## 2022-01-14 DIAGNOSIS — G4700 Insomnia, unspecified: Secondary | ICD-10-CM

## 2022-01-16 DIAGNOSIS — G43119 Migraine with aura, intractable, without status migrainosus: Secondary | ICD-10-CM | POA: Diagnosis not present

## 2022-01-16 DIAGNOSIS — M797 Fibromyalgia: Secondary | ICD-10-CM | POA: Diagnosis not present

## 2022-01-16 DIAGNOSIS — G4733 Obstructive sleep apnea (adult) (pediatric): Secondary | ICD-10-CM | POA: Diagnosis not present

## 2022-01-16 DIAGNOSIS — R278 Other lack of coordination: Secondary | ICD-10-CM | POA: Diagnosis not present

## 2022-01-18 DIAGNOSIS — F3181 Bipolar II disorder: Secondary | ICD-10-CM | POA: Diagnosis not present

## 2022-01-18 DIAGNOSIS — F4310 Post-traumatic stress disorder, unspecified: Secondary | ICD-10-CM | POA: Diagnosis not present

## 2022-01-18 DIAGNOSIS — F411 Generalized anxiety disorder: Secondary | ICD-10-CM | POA: Diagnosis not present

## 2022-01-21 ENCOUNTER — Encounter: Payer: Self-pay | Admitting: Psychiatry

## 2022-01-21 ENCOUNTER — Ambulatory Visit (INDEPENDENT_AMBULATORY_CARE_PROVIDER_SITE_OTHER): Payer: Medicaid Other | Admitting: Psychiatry

## 2022-01-21 ENCOUNTER — Telehealth: Payer: Self-pay

## 2022-01-21 VITALS — BP 121/80 | HR 80 | Temp 98.1°F | Wt 243.4 lb

## 2022-01-21 DIAGNOSIS — Z9189 Other specified personal risk factors, not elsewhere classified: Secondary | ICD-10-CM | POA: Diagnosis not present

## 2022-01-21 DIAGNOSIS — F411 Generalized anxiety disorder: Secondary | ICD-10-CM

## 2022-01-21 DIAGNOSIS — F3178 Bipolar disorder, in full remission, most recent episode mixed: Secondary | ICD-10-CM

## 2022-01-21 DIAGNOSIS — F4310 Post-traumatic stress disorder, unspecified: Secondary | ICD-10-CM

## 2022-01-21 DIAGNOSIS — G2571 Drug induced akathisia: Secondary | ICD-10-CM

## 2022-01-21 DIAGNOSIS — G4701 Insomnia due to medical condition: Secondary | ICD-10-CM

## 2022-01-21 DIAGNOSIS — F3181 Bipolar II disorder: Secondary | ICD-10-CM | POA: Diagnosis not present

## 2022-01-21 MED ORDER — ESZOPICLONE 1 MG PO TABS: 1.0000 mg | ORAL_TABLET | Freq: Every evening | ORAL | 2 refills | Status: DC | PRN

## 2022-01-21 NOTE — Progress Notes (Signed)
[facility] MD OP Progress Note ? ?01/21/2022 3:05 PM ?Alison [name]  ?MRN:  [id number] ? ?Chief Complaint:  ?Chief Complaint  ?Patient presents with  ? Follow-up ; 46 year old Caucasian female who has a history of bipolar disorder, PTSD, sleep problem, multiple medical problems presented for medication management.  ? ?HPI: Alison [name] is a 46 year old Caucasian female, lives in [place], has a history of bipolar disorder, PTSD, multiple medical problems including fibromyalgia, chronic pain, trigeminal neuralgia, history of liver transplant, B-cell lymphoma, SLE, diabetes mellitus, OSA, migraine headaches , mixed hyperlipidemia, hypertension, was evaluated in office today. ? ?Patient today reports her anxiety has improved since being on the lower dosage of Latuda.  She however continues to have episodes when she feels anxious although it is manageable. ? ?Patient reports sleep is restless.  She however had sleep study done and has an appointment to get fitted for CPAP this Friday.  She continues to be on the Lunesta. ? ?Had cardiology appointment recently, 12/17/2021-EKG showed right bundle branch block, sinus rhythm.  Per review of notes per cardiology-Dr.Agbor- Etang -advised to continue current medications. ? ?Patient denies any suicidality, homicidality or perceptual disturbances. ? ?Patient denies any other concerns today. ? ?Visit Diagnosis:  ?  ICD-10-CM   ?1. Bipolar disorder, in full remission, most recent episode mixed ([place])  F31.78   ?  ?2. PTSD (post-traumatic stress disorder)  F43.10   ?  ?3. GAD (generalized anxiety disorder)  F41.1   ?  ?4. Insomnia due to medical condition  G47.01 eszopiclone (LUNESTA) 1 MG TABS tablet  ? mood, restlessness  ?  ?5. At risk for prolonged QT interval syndrome  Z91.89   ?  ? ? ?Past Psychiatric History: Reviewed past psychiatric history from progress note on 06/10/2019. ? ?Past Medical History:  ?Past Medical History:  ?Diagnosis Date  ? Abnormal uterine bleeding   ?  Allergy   ? Anxiety   ? Arthritis   ? Bipolar disorder (manic depression) ([place])   ? Chronic kidney failure   ? Chronic renal disease, stage III ([place])   ? Diabetes mellitus without complication ([place])   ? 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 Right axillary lymph node resected and chemo tx's.  ? Dyspnea   ? FH: trigeminal neuralgia   ? GERD (gastroesophageal reflux disease)   ? Heart murmur   ? Hepatic cirrhosis ([place])   ? History of kidney stones   ? Hypertension   ? Kidney mass   ? Long Q-T syndrome   ? 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 Lupus ([place])   ? Major depressive disorder   ? 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 Chemo tx's  ? Migraine   ? Morbid obesity ([place])   ? Neuromuscular disorder ([place])   ? neuropathy  ? Neuropathy   ? Personality disorder ([place])   ? Pineal gland cyst   ? Post herpetic neuralgia   ? PTSD (post-traumatic stress disorder)   ? Renal disorder   ? S/P liver transplant ([place]) 1993  ? Sleep apnea   ? has not recieved cpap yet-other one was recalled  ?  ?Past Surgical History:  ?Procedure Laterality Date  ? BONE MARROW BIOPSY  01/13/2015  ? BREAST BIOPSY  12/2014  ? BREAST BIOPSY  2011  ? BREAST SURGERY    ? CHOLECYSTECTOMY    ? COLONOSCOPY    ? ESOPHAGOGASTRODUODENOSCOPY    ? ESOPHAGOGASTRODUODENOSCOPY (EGD) WITH PROPOFOL N/A 01/09/2021  ? Procedure: ESOPHAGOGASTRODUODENOSCOPY (  EGD) WITH PROPOFOL;  Surgeon: [name], MD;  Location: [facility] ENDOSCOPY;  Service: Endoscopy;  Laterality: N/A;  ? HERNIA REPAIR    ? x4-all from liver transplant  ? HYSTEROSCOPY WITH D & C N/A 11/13/2021  ? Procedure: DILATATION AND CURETTAGE /HYSTEROSCOPY;  Surgeon: Homero Fellers, MD;  Location: [facility] ORS;  Service: Gynecology;  Laterality: N/A;  ? infusaport    ? LIVER TRANSPLANT  12/17/1991  ? LUMBAR PUNCTURE    ? PORT A CATH INJECTION ([place] HX)    ? RENAL BIOPSY    ? tumor removal  2015  ? cancer right side  ? ? ?Family Psychiatric History: Reviewed family psychiatric history from progress note  on 06/10/2019. ? ?Family History:  ?Family History  ?Problem Relation Age of Onset  ? Heart disease Mother   ? Hypertension Mother   ? Cancer - Other Mother   ? Bipolar disorder Mother   ? Heart disease Father   ? Hypertension Father   ? Diabetes Father   ? Parkinson's disease Maternal Grandmother   ? Cancer Maternal Aunt   ? Cancer Maternal Uncle   ? Cancer Maternal Grandfather   ? Lupus Paternal Grandmother   ? Hypertension Brother   ? ? ?Social History: Reviewed social history from progress note on 06/10/2019. ?Social History  ? ?Socioeconomic History  ? Marital status: Single  ?  Spouse name: Not on file  ? Number of children: 0  ? Years of education: 78  ? Highest education level: GED or equivalent  ?Occupational History  ? Occupation: disabled  ?Tobacco Use  ? Smoking status: Never  ? Smokeless tobacco: Never  ?Vaping Use  ? Vaping Use: Never used  ?Substance and Sexual Activity  ? Alcohol use: Not Currently  ? Drug use: Not Currently  ?  Types: Marijuana  ?  Comment: pain managment last used in early april  ? Sexual activity: Not Currently  ?Other Topics Concern  ? Not on file  ?Social History Narrative  ? Not on file  ? ?Social Determinants of Health  ? ?Financial Resource Strain: Low Risk   ? Difficulty of Paying Living Expenses: Not very hard  ?Food Insecurity: No Food Insecurity  ? Worried About Charity fundraiser in the Last Year: Never true  ? Ran Out of Food in the Last Year: Never true  ?Transportation Needs: No Transportation Needs  ? Lack of Transportation (Medical): No  ? Lack of Transportation (Non-Medical): No  ?Physical Activity: Insufficiently Active  ? Days of Exercise per Week: 7 days  ? Minutes of Exercise per Session: 20 min  ?Stress: No Stress Concern Present  ? Feeling of Stress : Only a little  ?Social Connections: Moderately Integrated  ? Frequency of Communication with Friends and Family: More than three times a week  ? Frequency of Social Gatherings with Friends and Family: More than  three times a week  ? Attends Religious Services: More than 4 times per year  ? Active Member of Clubs or Organizations: No  ? Attends Archivist Meetings: Never  ? Marital Status: Living with partner  ? ? ?Allergies:  ?Allergies  ?Allergen Reactions  ? Penicillin G Itching, Rash, Anaphylaxis and Palpitations  ? Lamictal [Lamotrigine] Rash  ? Carbamazepine Other (See Comments)  ?  Medication interaction-prograf ?  ? Hydrocodone-Acetaminophen Itching  ? Naproxen Itching  ? ? ?Metabolic Disorder Labs: ?Lab Results  ?Component Value Date  ? HGBA1C 8.9 (A) 12/11/2020  ? MPG 209 06/05/2020  ? ?  Lab Results  ?Component Value Date  ? PROLACTIN 13.4 07/15/2019  ? PROLACTIN 21.3 05/24/2019  ? ?Lab Results  ?Component Value Date  ? CHOL 173 12/03/2021  ? TRIG 195 (H) 12/03/2021  ? HDL 38 (L) 12/03/2021  ? CHOLHDL 4.6 (H) 12/03/2021  ? LDLCALC 101 (H) 12/03/2021  ? LDLCALC 119 (H) 09/07/2021  ? ?Lab Results  ?Component Value Date  ? TSH 2.650 07/15/2019  ? TSH 4.650 (H) 05/24/2019  ? ? ?Therapeutic Level Labs: ?No results found for: LITHIUM ?No results found for: VALPROATE ?No components found for:  CBMZ ? ?Current Medications: ?Current Outpatient Medications  ?Medication Sig Dispense Refill  ? Accu-Chek FastClix Lancets MISC Apply topically 2 (two) times daily.    ? baclofen (LIORESAL) 10 MG tablet Take 10 mg by mouth 2 (two) times daily.    ? benztropine (COGENTIN) 1 MG tablet Take 1 tablet (1 mg total) by mouth daily as needed for tremors. 30 tablet 1  ? cholecalciferol (VITAMIN D3) 25 MCG (1000 UNIT) tablet Take 1,000 Units by mouth in the morning and at bedtime.    ? CRANBERRY PO Take 1 capsule by mouth in the morning and at bedtime.    ? Dulaglutide 4.5 MG/0.5ML SOPN Inject 4.5 mg into the skin once a week. Sundays    ? ezetimibe (ZETIA) 10 MG tablet Take 10 mg by mouth every morning.    ? fluticasone (FLONASE) 50 MCG/ACT nasal spray Place 1 spray into both nostrils daily as needed for allergies.    ? glucose  blood (COOL BLOOD GLUCOSE TEST STRIPS) test strip Use as instructed 100 each 12  ? icosapent Ethyl (VASCEPA) 1 g capsule Take 2 capsules (2 g total) by mouth 2 (two) times daily. 120 capsule 5  ? lisinopril (ZES

## 2022-01-22 ENCOUNTER — Other Ambulatory Visit: Payer: Self-pay | Admitting: Pharmacist

## 2022-01-22 ENCOUNTER — Other Ambulatory Visit: Payer: Self-pay | Admitting: Physician Assistant

## 2022-01-22 DIAGNOSIS — K7581 Nonalcoholic steatohepatitis (NASH): Secondary | ICD-10-CM

## 2022-01-22 DIAGNOSIS — N3281 Overactive bladder: Secondary | ICD-10-CM

## 2022-01-22 DIAGNOSIS — E1165 Type 2 diabetes mellitus with hyperglycemia: Secondary | ICD-10-CM

## 2022-01-22 NOTE — Patient Instructions (Signed)
It was great to speak with you today! ? ?We want to check liver enzymes and cholesterol about 6 weeks after starting rosuvastatin - since you have only been on it for about 2 weeks now, please plan to get those labs checked in about 4 weeks - so end of May.  ? ?Check your blood sugars twice daily:  ?1) Fasting, first thing in the morning before breakfast and  ?2) 2 hours after your largest meal.  ? ?For a goal A1c of less than 7%, goal fasting readings are less than 130 and goal 2 hour after meal readings are less than 180.  ? ?Thank you for scheduling follow up with Serafina Royals for follow up on screenings and immunizations! ? ?Take care! ? ?Catie Hedwig Morton, PharmD, BCACP ?[place] ?[phone] ? ? ?Visit Information ? ?Ms. Wilkowski was given information about Medicaid Managed Care team care coordination services as a part of their Healthy [facility] Medicaid benefit. [name] verbally consented to engagement with the [facility] Managed Care team.  ? ?If you are experiencing a medical emergency, please call 911 or report to your local emergency department or urgent care.  ? ?If you have a non-emergency medical problem during routine business hours, please contact your provider's office and ask to speak with a nurse.  ? ?For questions related to your Healthy [facility] health plan, please call: [phone] or visit the homepage here: [email or web address] ? ?If you would like to schedule transportation through your Healthy [facility] plan, please call the following number at least 2 days in advance of your appointment: [phone] ? For information about your ride after you set it up, call Ride Assist at [phone]. Use this number to activate a Will Call pickup, or if your transportation is late for a scheduled pickup. Use this number, too, if you need to make a change or cancel a previously scheduled reservation. ? If you need transportation services  right away, call [phone]. The after-hours call center is staffed 24 hours to handle ride assistance and urgent reservation requests (including discharges) 365 days a year. Urgent trips include sick visits, hospital discharge requests and life-sustaining treatment. ? ? ? ?  ?

## 2022-01-22 NOTE — Patient Outreach (Signed)
? ?   ? ?Chief Complaint  ?Patient presents with  ? High Risk Managed Medicaid  ? ? ?Alison [name] is a 46 y.o. year old female who was referred for medication management by their primary care provider, Bo Merino, FNP. They presented for a telephone visit in the context of the COVID-19 pandemic. ?  ?They were referred to the pharmacist by their High Risk Managed Medicaid Care Team  for assistance in managing diabetes, hypertension, and hyperlipidemia.  ? ?Subjective: ? ?Care Team: ?Primary Care Provider: Bo Merino, FNP ; Next Scheduled Visit: Friday ?Cardiologist: Agbor-Etang; Next Scheduled Visit: 06/21/22 ?Endocrinologist Morayati; Next Scheduled Visit: 3 months ?Neurologist: Shah/[facility]); Next Scheduled Visit: due in 6 months ?Psychiatrist: [name]; Next Scheduled Visit: 03/18/22 ?Liver Transplant   - ultrasound scheduled June 2nd  ?Urology - Vaillancourt; Next Scheduled visit: Thursday ?Oncology: Rao/Allen; Next Scheduled Visit: 05/13/22 ? ?Medication Access/Adherence ? ?Current Pharmacy:  ?[facility] - [place], [place] - [address] ?[address] ?[place] [address] ?Phone: [phone] Fax: [phone] ? ? ?Patient reports affordability concerns with their medications: No  ?Patient reports access/transportation concerns to their pharmacy: No  ?Patient reports adherence concerns with their medications:  No   ? ? ?Diabetes: ? ?Current medications: Trulicity 4.5 mg weekly, metformin 1000 mg twice daily ?Medications tried in the past: no noted hx SGLT2 ? ?Current glucose readings: reports fasting this morning was 114, reports she has generally seen post-prandial readings ~ 140, all less than 180 ? ?Denies intolerability concerns since Dr. Ronnald Collum increased Trulicity at last visit on 12/12/21.  ? ?Hyperlipidemia/ASCVD Risk Reduction ? ?Current lipid lowering medications: rosuvastatin 10 mg daily, ezetimibe 10 mg daily, Vascepa 2 g twice  daily ? ?Denies any intolerance concerns. Reports she actually did not start rosuvastatin until ~ 2 weeks ago ? ? ?Health Maintenance ? ?Yearly diabetic eye exam: up to date ?Yearly diabetic foot exam: due  ?Urine microalbumin: up to date ?Yearly influenza vaccination: up to date ?Td/Tdap vaccination: due ?Pneumonia vaccination: up to date ?COVID vaccinations: up to date ?Colonoscopy: due ?PAP Smear: up to date ?Hepatitis C Screening: due ? ?Objective: ?Lab Results  ?Component Value Date  ? HGBA1C 8.9 (A) 12/11/2020  ? ? ?Lab Results  ?Component Value Date  ? CREATININE 0.85 12/03/2021  ? BUN 18 12/03/2021  ? NA 138 12/03/2021  ? K 4.3 12/03/2021  ? CL 103 12/03/2021  ? CO2 23 12/03/2021  ? ? ?Lab Results  ?Component Value Date  ? CHOL 173 12/03/2021  ? HDL 38 (L) 12/03/2021  ? LDLCALC 101 (H) 12/03/2021  ? TRIG 195 (H) 12/03/2021  ? CHOLHDL 4.6 (H) 12/03/2021  ? ? ?Medications Reviewed Today   ? ? Reviewed by Osker Mason, RPH-CPP (Pharmacist) on 01/22/22 at 1156  Med List Status: <None>  ? ?Medication Order Taking? Sig Documenting Provider Last Dose Status Informant  ?Accu-Chek FastClix Lancets MISC [id number] Yes Apply topically 2 (two) times daily. [provider] Taking Active   ?baclofen (LIORESAL) 10 MG tablet [id number] Yes Take 10 mg by mouth 2 (two) times daily. [provider] Taking Active Self  ?benztropine (COGENTIN) 1 MG tablet [id number] Yes Take 1 tablet (1 mg total) by mouth daily as needed for tremors. [name], MD Taking Active Self  ?cholecalciferol (VITAMIN D3) 25 MCG (1000 UNIT) tablet [id number] Yes Take 1,000 Units by mouth in the morning and at bedtime. [provider] Taking Active Self  ?CRANBERRY  PO [id number] Yes Take 1 capsule by mouth in the morning and at bedtime. [provider] Taking Active Self  ?Dulaglutide 4.5 MG/0.5ML SOPN [id number] Yes Inject 4.5 mg into the skin once a week. [provider] Taking Active   ?eszopiclone  (LUNESTA) 1 MG TABS tablet [id number] Yes Take 1 tablet (1 mg total) by mouth at bedtime as needed. for sleep [name], MD Taking Active   ?ezetimibe (ZETIA) 10 MG tablet [id number] Yes Take 10 mg by mouth every morning. [provider] Taking Active Self  ?fluticasone (FLONASE) 50 MCG/ACT nasal spray [id number] Yes Place 1 spray into both nostrils daily as needed for allergies. [provider] Taking Active Self  ?glucose blood (COOL BLOOD GLUCOSE TEST STRIPS) test strip [id number]  Use as instructed Steele Sizer, MD  Active Self  ?icosapent Ethyl (VASCEPA) 1 g capsule [id number] Yes Take 2 capsules (2 g total) by mouth 2 (two) times daily. [name], MD Taking Active Self  ?lisinopril (ZESTRIL) 20 MG tablet [id number] Yes Take 1 tablet (20 mg total) by mouth daily. [name], PA-C Taking Active Self  ?lurasidone (LATUDA) 20 MG TABS tablet [id number] Yes Take 1 tablet (20 mg total) by mouth daily with supper. [name], MD Taking Active   ?Magnesium 500 MG CAPS [id number] Yes Take 500 mg by mouth in the morning and at bedtime. [provider] Taking Active Self  ?Melatonin 10 MG TABS [id number] Yes Take 10 mg by mouth at bedtime. [provider] Taking Active Self  ?metFORMIN (GLUCOPHAGE) 1000 MG tablet [id number] Yes TAKE ONE TABLET BY MOUTH TWICE DAILY with meals Bo Merino, FNP Taking Active Self  ?MYRBETRIQ 50 MG TB24 tablet [id number] Yes TAKE ONE TABLET BY MOUTH ONCE DAILY Vaillancourt, Samantha, PA-C Taking Active Self  ?naloxone (NARCAN) nasal spray 4 mg/0.1 mL [id number]  Place 0.4 mg into the nose once. [provider]  Active Self  ?oxyCODONE ER [facility] ER) 13.5 MG C12A [id number] Yes Take 13.5 mg by mouth 2 (two) times daily. [provider] Taking Active Self  ?pregabalin (LYRICA) 200 MG capsule [id number]  Take 1 capsule (200 mg total) by mouth 2 (two) times daily. [name], MD  Expired 01/02/22 2359   ?promethazine  (PHENERGAN) 25 MG tablet [id number] No Take 25 mg by mouth daily as needed.  ?Patient not taking: Reported on 01/22/2022  ? [provider] Not Taking Active   ?rosuvastatin (CRESTOR) 10 MG tablet [id number] Yes Take 1 tablet (10 mg total) by mouth daily. [name], MD Taking Active   ?tacrolimus (PROGRAF) 1 MG capsule [id number] Yes Take 1 capsule by mouth once daily [name], FNP Taking Active Self  ?         ?Med Note [name], [name]   Fri Jan 19, 2021  5:01 AM)    ?trimethoprim (TRIMPEX) 100 MG tablet [id number] Yes Take 1 tablet (100 mg total) by mouth daily. [name], MD Taking Active   ?UBRELVY 50 MG TABS [id number] Yes Take 1 tablet by mouth daily as needed. [provider] Taking Active   ?venlafaxine XR (EFFEXOR-XR) 37.5 MG 24 hr capsule [id number] Yes TAKE ONE CAPSULE BY MOUTH ONCE DAILY WITH BREAKFAST [name], MD Taking Active   ?XYLIMELTS 550 MG DISK [id number]  Take 550 mg by mouth every 8 (eight) hours as needed (dry mouth). [provider]  Active Self  ? ?  ?  ? ?  ? ? ?Assessment/Plan:  ?Care Plan : Medication Management  ?Updates  made by Osker Mason, RPH-CPP since 01/22/2022 12:00 AM  ?  ? ?Problem: Type 2 Diabetes   ?  ? ?Long-Range Goal: A1c <7%   ?Start Date: 12/18/2021  ?Recent Progress: On track  ?Priority: High  ?Note:   ?Current Barriers:  ?Unable to achieve control of diabetes  ? ?Patient Needs: ?Optimized medication regimen ? ?Patient Activities: ?Patient will:  ?- take medications as prescribed as evidenced by patient report and record review ?check glucose twice daily, document, and provide at future appointments ? ?  ? ?Problem: ASCVD Risk Reduction/Hypercholesterolemia   ?  ? ?Long-Range Goal: LDL <70   ?Start Date: 12/18/2021  ?Note:   ?Current Barriers:  ?Unable to achieve control of cholesterol due to drug-disease interactions  ? ?Patient Needs: ?Continued collaboration with cardiology ? ?Patient Activities: ?Patient will:  ?-  take medications as prescribed as evidenced by patient report and record review ?check blood pressure periodically, document, and provide at future appointments ? ? ?  ? ? ?Diabetes: ?- Currently uncontrolle

## 2022-01-23 DIAGNOSIS — F4310 Post-traumatic stress disorder, unspecified: Secondary | ICD-10-CM | POA: Diagnosis not present

## 2022-01-23 DIAGNOSIS — F3181 Bipolar II disorder: Secondary | ICD-10-CM | POA: Diagnosis not present

## 2022-01-23 DIAGNOSIS — F411 Generalized anxiety disorder: Secondary | ICD-10-CM | POA: Diagnosis not present

## 2022-01-24 ENCOUNTER — Ambulatory Visit: Payer: Medicaid Other | Admitting: Physician Assistant

## 2022-01-25 ENCOUNTER — Encounter: Payer: Self-pay | Admitting: Nurse Practitioner

## 2022-01-25 ENCOUNTER — Ambulatory Visit: Payer: Medicaid Other | Admitting: Nurse Practitioner

## 2022-01-25 ENCOUNTER — Other Ambulatory Visit: Payer: Self-pay

## 2022-01-25 VITALS — BP 122/84 | HR 78 | Temp 98.1°F | Resp 18 | Ht 66.5 in | Wt 242.3 lb

## 2022-01-25 DIAGNOSIS — Z1211 Encounter for screening for malignant neoplasm of colon: Secondary | ICD-10-CM | POA: Diagnosis not present

## 2022-01-25 DIAGNOSIS — I10 Essential (primary) hypertension: Secondary | ICD-10-CM | POA: Diagnosis not present

## 2022-01-25 DIAGNOSIS — G4733 Obstructive sleep apnea (adult) (pediatric): Secondary | ICD-10-CM | POA: Diagnosis not present

## 2022-01-25 DIAGNOSIS — Z9989 Dependence on other enabling machines and devices: Secondary | ICD-10-CM | POA: Diagnosis not present

## 2022-01-25 DIAGNOSIS — G43109 Migraine with aura, not intractable, without status migrainosus: Secondary | ICD-10-CM | POA: Diagnosis not present

## 2022-01-25 DIAGNOSIS — E1165 Type 2 diabetes mellitus with hyperglycemia: Secondary | ICD-10-CM

## 2022-01-25 DIAGNOSIS — E785 Hyperlipidemia, unspecified: Secondary | ICD-10-CM

## 2022-01-25 DIAGNOSIS — G500 Trigeminal neuralgia: Secondary | ICD-10-CM | POA: Diagnosis not present

## 2022-01-25 DIAGNOSIS — F3178 Bipolar disorder, in full remission, most recent episode mixed: Secondary | ICD-10-CM

## 2022-01-25 NOTE — Progress Notes (Addendum)
? ?BP 122/84   Pulse 78   Temp 98.1 ?F (36.7 ?C) (Oral)   Resp 18   Ht 5' 6.5" (1.689 m)   Wt 242 lb 4.8 oz (109.9 kg)   SpO2 96%   BMI 38.52 kg/m?   ? ?Subjective:  ? ? Patient ID: Alison [name], female    DOB: 10-08-1975, 46 y.o.   MRN: [id number] ? ?HPI: ?Alison [name] is a 46 y.o. female ? ?Chief Complaint  ?Patient presents with  ? Follow-up  ? ?Hypertension: Her blood pressure today is 122/84.  She says her blood pressure has been fine.  She denies any chest pain, shortness of breath, headaches or blurred vision.  She says she does take her lisinopril 20 mg daily.  ? ?Diabetes: She says her fasting blood sugar was 114. Her last A1C was 6.9.  She currently takes Trulicity 4.5 mg weekly and metformin 1000 mg twice daily.  She says she is doing well on this.  She sees Dr.Morayati for her diabetes management.  She is up-to-date on her eye and foot exam. ? ?Hyperlipidemia: Her last LDL was 101 on 12/03/2021.  She is currently taking rosuvastatin 10 mg daily and Zetia 10 mg daily.  She denies any myalgia. continue with current treatment plan.  ?The 10-year ASCVD risk score (Arnett DK, et al., 2019) is: 3.3% ?  Values used to calculate the score: ?    Age: 66 years ?    Sex: Female ?    Is Non-Hispanic African American: No ?    Diabetic: Yes ?    Tobacco smoker: No ?    Systolic Blood Pressure: 665 mmHg ?    Is BP treated: Yes ?    HDL Cholesterol: 38 mg/dL ?    Total Cholesterol: 173 mg/dL  ?Migraines/trigeminal neuralgia: She last saw neurology on 01/16/2022 with Dr. Manuella Ghazi.  She currently takes Lyrica 200 mg 2 times a day and baclofen 10 mg 2 times a day for trigeminal neuralgia.  As her pain has been pretty well controlled with the trigeminal neuralgia she says over the last few weeks she has had a couple of shocks but not bad. She says she has not had a migraine in a while but she does have Ubrelvy if needed. ? ?Sleep apnea/ cpap: She says she just got her CPAP machine today.  She says she already took  a nap with that and is feeling better already.  Her sleep study was done at [facility] on 08/27/2021.  ? ?Bipolar/insomnia: She currently [facility]  She currently takes Effexor 37.5 mg daily, Latuda 20 mg daily, and she also takes Cogentin 1 mg p.o. daily. She also takes [place] for insomnia.  She last saw Dr. Lalla Brothers on 01/21/2022 and has a follow-up with him in 2 months. ?  ? ?  01/25/2022  ?  1:50 PM 01/21/2022  ?  2:45 PM 11/05/2021  ?  4:13 PM 10/24/2021  ?  4:20 PM 10/19/2021  ?  1:07 PM  ?Depression screen PHQ 2/9  ?Decreased Interest 2 1 1  0 2  ?Down, Depressed, Hopeless 2 1 1  0 2  ?PHQ - 2 Score 4 2 2  0 4  ?Altered sleeping 1 3 2  3   ?Tired, decreased energy 1 1 1  1   ?Change in appetite 1 1 1  3   ?Feeling bad or failure about yourself  0 1 0  2  ?Trouble concentrating 1 2  1  3  ?Moving slowly or fidgety/restless 0 1 0  3  ?Suicidal thoughts 0 0 0  0  ?PHQ-9 Score 8 11 7  19   ?Difficult doing work/chores Somewhat difficult Somewhat difficult   Somewhat difficult  ?  ? ?  01/25/2022  ?  1:50 PM 10/24/2021  ?  4:20 PM 10/19/2021  ?  1:08 PM 08/29/2021  ?  1:46 PM  ?GAD 7 : Generalized Anxiety Score  ?Nervous, Anxious, on Edge 1 3 2 2   ?Control/stop worrying 1 0 3 2  ?Worry too much - different things 1 0 3 2  ?Trouble relaxing 1 2 3 2   ?Restless 1 2 3 3   ?Easily annoyed or irritable 1 2 3 2   ?Afraid - awful might happen 1 0 3 2  ?Total GAD 7 Score 7 9 20 15   ?Anxiety Difficulty Somewhat difficult Not difficult at all Somewhat difficult Somewhat difficult  ? ?  ?Relevant past medical, surgical, family and social history reviewed and updated as indicated. Interim medical history since our last visit reviewed. ?Allergies and medications reviewed and updated. ? ?Review of Systems ? ?Constitutional: Negative for fever or weight change.  ?Respiratory: Negative for cough and shortness of breath.   ?Cardiovascular: Negative for chest pain or palpitations.  ?Gastrointestinal:  Negative for abdominal pain, no bowel changes.  ?Musculoskeletal: Negative for gait problem or joint swelling.  ?Skin: Negative for rash.  ?Neurological: Negative for dizziness or headache.  ?No other specific complaints in a complete review of systems (except as listed in HPI above).  ? ?   ?Objective:  ?  ?BP 122/84   Pulse 78   Temp 98.1 ?F (36.7 ?C) (Oral)   Resp 18   Ht 5' 6.5" (1.689 m)   Wt 242 lb 4.8 oz (109.9 kg)   SpO2 96%   BMI 38.52 kg/m?   ?Wt Readings from Last 3 Encounters:  ?01/25/22 242 lb 4.8 oz (109.9 kg)  ?01/21/22 243 lb 6.4 oz (110.4 kg)  ?12/17/21 241 lb (109.3 kg)  ?  ?Physical Exam ? ?Constitutional: Patient appears well-developed and well-nourished. Obese  No distress.  ?HEENT: head atraumatic, normocephalic, pupils equal and reactive to light, neck supple ?Cardiovascular: Normal rate, regular rhythm and normal heart sounds.  No murmur heard. No BLE edema. ?Pulmonary/Chest: Effort normal and breath sounds normal. No respiratory distress. ?Abdominal: Soft.  There is no tenderness. ?Psychiatric: Patient has a normal mood and affect. behavior is normal. Judgment and thought content normal.  ?Results for orders placed or performed during the hospital encounter of 12/03/21  ?Gastrointestinal Panel by PCR , Stool  ? Specimen: Stool  ?Result Value Ref Range  ? Campylobacter species NOT DETECTED NOT DETECTED  ? Plesimonas shigelloides NOT DETECTED NOT DETECTED  ? Salmonella species NOT DETECTED NOT DETECTED  ? Yersinia enterocolitica NOT DETECTED NOT DETECTED  ? Vibrio species NOT DETECTED NOT DETECTED  ? Vibrio cholerae NOT DETECTED NOT DETECTED  ? Enteroaggregative E coli (EAEC) NOT DETECTED NOT DETECTED  ? Enteropathogenic E coli (EPEC) DETECTED (A) NOT DETECTED  ? Enterotoxigenic E coli (ETEC) NOT DETECTED NOT DETECTED  ? Shiga like toxin producing E coli (STEC) NOT DETECTED NOT DETECTED  ? Shigella/Enteroinvasive E coli (EIEC) NOT DETECTED NOT DETECTED  ? Cryptosporidium NOT DETECTED  NOT DETECTED  ? Cyclospora cayetanensis NOT DETECTED NOT DETECTED  ? Entamoeba histolytica NOT DETECTED NOT DETECTED  ? Giardia lamblia NOT DETECTED NOT DETECTED  ? Adenovirus F40/41 NOT DETECTED NOT DETECTED  ?  Astrovirus NOT DETECTED NOT DETECTED  ? Norovirus GI/GII NOT DETECTED NOT DETECTED  ? Rotavirus A NOT DETECTED NOT DETECTED  ? Sapovirus (I, II, IV, and V) NOT DETECTED NOT DETECTED  ?C Difficile Quick Screen w PCR reflex  ? Specimen: Stool  ?Result Value Ref Range  ? C Diff antigen POSITIVE (A) NEGATIVE  ? C Diff toxin NEGATIVE NEGATIVE  ? C Diff interpretation Results are indeterminate. See PCR results.   ?C. Diff by PCR, Reflexed  ?Result Value Ref Range  ? Toxigenic C. Difficile by PCR NEGATIVE NEGATIVE  ?Lipase, blood  ?Result Value Ref Range  ? Lipase 48 11 - 51 U/L  ?Comprehensive metabolic panel  ?Result Value Ref Range  ? Sodium 138 135 - 145 mmol/L  ? Potassium 4.3 3.5 - 5.1 mmol/L  ? Chloride 103 98 - 111 mmol/L  ? CO2 23 22 - 32 mmol/L  ? Glucose, Bld 161 (H) 70 - 99 mg/dL  ? BUN 18 6 - 20 mg/dL  ? Creatinine, Ser 0.85 0.44 - 1.00 mg/dL  ? Calcium 10.0 8.9 - 10.3 mg/dL  ? Total Protein 8.1 6.5 - 8.1 g/dL  ? Albumin 4.3 3.5 - 5.0 g/dL  ? AST 27 15 - 41 U/L  ? ALT 29 0 - 44 U/L  ? Alkaline Phosphatase 63 38 - 126 U/L  ? Total Bilirubin 0.8 0.3 - 1.2 mg/dL  ? GFR, Estimated >60 >60 mL/min  ? Anion gap 12 5 - 15  ?CBC  ?Result Value Ref Range  ? WBC 8.2 4.0 - 10.5 K/uL  ? RBC 5.04 3.87 - 5.11 MIL/uL  ? Hemoglobin 14.3 12.0 - 15.0 g/dL  ? HCT 43.2 36.0 - 46.0 %  ? MCV 85.7 80.0 - 100.0 fL  ? MCH 28.4 26.0 - 34.0 pg  ? MCHC 33.1 30.0 - 36.0 g/dL  ? RDW 13.8 11.5 - 15.5 %  ? Platelets 171 150 - 400 K/uL  ? nRBC 0.0 0.0 - 0.2 %  ?Urinalysis, Routine w reflex microscopic  ?Result Value Ref Range  ? Color, Urine YELLOW (A) YELLOW  ? APPearance HAZY (A) CLEAR  ? Specific Gravity, Urine 1.012 1.005 - 1.030  ? pH 6.0 5.0 - 8.0  ? Glucose, UA NEGATIVE NEGATIVE mg/dL  ? Hgb urine dipstick NEGATIVE NEGATIVE  ?  Bilirubin Urine NEGATIVE NEGATIVE  ? Ketones, ur NEGATIVE NEGATIVE mg/dL  ? Protein, ur 100 (A) NEGATIVE mg/dL  ? Nitrite NEGATIVE NEGATIVE  ? Leukocytes,Ua TRACE (A) NEGATIVE  ? RBC / HPF 0-5 0 - 5 RBC/hpf  ? WBC, UA

## 2022-01-28 ENCOUNTER — Other Ambulatory Visit: Payer: Self-pay

## 2022-01-28 ENCOUNTER — Telehealth: Payer: Self-pay

## 2022-01-28 DIAGNOSIS — Z1211 Encounter for screening for malignant neoplasm of colon: Secondary | ICD-10-CM

## 2022-01-28 MED ORDER — NA SULFATE-K SULFATE-MG SULF 17.5-3.13-1.6 GM/177ML PO SOLN: 1.0000 | Freq: Once | ORAL | 0 refills | Status: AC

## 2022-01-28 NOTE — Telephone Encounter (Signed)
Gastroenterology Pre-Procedure Review ? ?Request Date: 02/26/22 ?Requesting Physician: Dr. Allen Norris ? ?PATIENT REVIEW QUESTIONS: The patient responded to the following health history questions as indicated:   ? ?1. Are you having any GI issues? no ?2. Do you have a personal history of Polyps? no ?3. Do you have a family history of Colon Cancer or Polyps?  Mother and father precancerous polyps ?4. Diabetes Mellitus? yes (type 2) ?5. Joint replacements in the past 12 months?no ?6. Major health problems in the past 3 months?no ?7. Any artificial heart valves, MVP, or defibrillator?no ?   ?MEDICATIONS & ALLERGIES:    ?Patient reports the following regarding taking any anticoagulation/antiplatelet therapy:   ?Plavix, Coumadin, Eliquis, Xarelto, Lovenox, Pradaxa, Brilinta, or Effient? no ?Aspirin? no ? ?Patient confirms/reports the following medications:  ?Current Outpatient Medications  ?Medication Sig Dispense Refill  ? baclofen (LIORESAL) 10 MG tablet Take 10 mg by mouth 2 (two) times daily.    ? benztropine (COGENTIN) 1 MG tablet Take 1 tablet (1 mg total) by mouth daily as needed for tremors. 30 tablet 1  ? cholecalciferol (VITAMIN D3) 25 MCG (1000 UNIT) tablet Take 1,000 Units by mouth in the morning and at bedtime.    ? CRANBERRY PO Take 1 capsule by mouth in the morning and at bedtime.    ? Dulaglutide 4.5 MG/0.5ML SOPN Inject 4.5 mg into the skin once a week.    ? eszopiclone (LUNESTA) 1 MG TABS tablet Take 1 tablet (1 mg total) by mouth at bedtime as needed. for sleep 30 tablet 2  ? ezetimibe (ZETIA) 10 MG tablet Take 10 mg by mouth every morning.    ? fluticasone (FLONASE) 50 MCG/ACT nasal spray Place 1 spray into both nostrils daily as needed for allergies.    ? icosapent Ethyl (VASCEPA) 1 g capsule Take 2 capsules (2 g total) by mouth 2 (two) times daily. 120 capsule 5  ? lisinopril (ZESTRIL) 20 MG tablet Take 1 tablet (20 mg total) by mouth daily. 90 tablet 3  ? lurasidone (LATUDA) 20 MG TABS tablet Take 1  tablet (20 mg total) by mouth daily with supper. 90 tablet 0  ? Magnesium 500 MG CAPS Take 500 mg by mouth in the morning and at bedtime.    ? Melatonin 10 MG TABS Take 10 mg by mouth at bedtime.    ? metFORMIN (GLUCOPHAGE) 1000 MG tablet TAKE ONE TABLET BY MOUTH TWICE DAILY with meals 180 tablet 2  ? MYRBETRIQ 50 MG TB24 tablet TAKE ONE TABLET BY MOUTH ONCE DAILY 90 tablet 3  ? naloxone (NARCAN) nasal spray 4 mg/0.1 mL Place 0.4 mg into the nose once.    ? oxyCODONE ER (XTAMPZA ER) 13.5 MG C12A Take 13.5 mg by mouth 2 (two) times daily.    ? pregabalin (LYRICA) 200 MG capsule Take 1 capsule (200 mg total) by mouth 2 (two) times daily. 60 capsule 0  ? rosuvastatin (CRESTOR) 10 MG tablet Take 1 tablet (10 mg total) by mouth daily. 90 tablet 3  ? tacrolimus (PROGRAF) 1 MG capsule Take 1 capsule by mouth once daily 30 capsule 0  ? trimethoprim (TRIMPEX) 100 MG tablet Take 1 tablet (100 mg total) by mouth daily. 30 tablet 11  ? UBRELVY 50 MG TABS Take 1 tablet by mouth daily as needed.    ? venlafaxine XR (EFFEXOR-XR) 37.5 MG 24 hr capsule TAKE ONE CAPSULE BY MOUTH ONCE DAILY WITH BREAKFAST 90 capsule 0  ? XYLIMELTS 550 MG DISK Take 550 mg  by mouth every 8 (eight) hours as needed (dry mouth).    ? ?No current facility-administered medications for this visit.  ? ? ?Patient confirms/reports the following allergies:  ?Allergies  ?Allergen Reactions  ? Penicillin G Itching, Rash, Anaphylaxis and Palpitations  ? Lamictal [Lamotrigine] Rash  ? Carbamazepine Other (See Comments)  ?  Medication interaction-prograf ?  ? Hydrocodone-Acetaminophen Itching  ? Naproxen Itching  ? ? ?No orders of the defined types were placed in this encounter. ? ? ?AUTHORIZATION INFORMATION ?Primary Insurance: ?1D#: ?Group #: ? ?Secondary Insurance: ?1D#: ?Group #: ? ?SCHEDULE INFORMATION: ?Date: 02/28/22 ?Time: ?Location: [facility] ?

## 2022-01-29 ENCOUNTER — Other Ambulatory Visit: Payer: Medicaid Other

## 2022-01-29 ENCOUNTER — Ambulatory Visit: Payer: Medicaid Other | Admitting: Oncology

## 2022-01-29 DIAGNOSIS — G4733 Obstructive sleep apnea (adult) (pediatric): Secondary | ICD-10-CM | POA: Diagnosis not present

## 2022-01-31 IMAGING — MG MM BREAST BX W LOC DEV 1ST LESION IMAGE BX SPEC STEREO GUIDE*L*
7 of 13 series · 7 of 33 positions shown · non-contrast
Comparison: Previous exams.
COMPARISON: Previous exams.
COMPARISON: Previous exams.

Addendum:
CLINICAL DATA: Patient presents for stereotactic guided core biopsy
of LEFT breast calcifications.

EXAM:
LEFT BREAST STEREOTACTIC CORE NEEDLE BIOPSY

[L (1 of 6)]
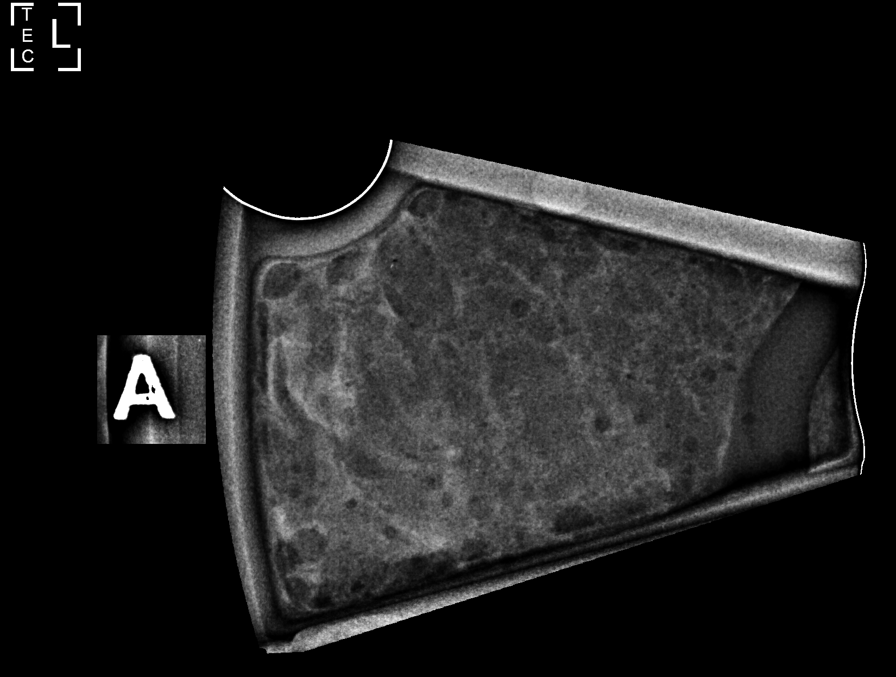

[L (2 of 6)]
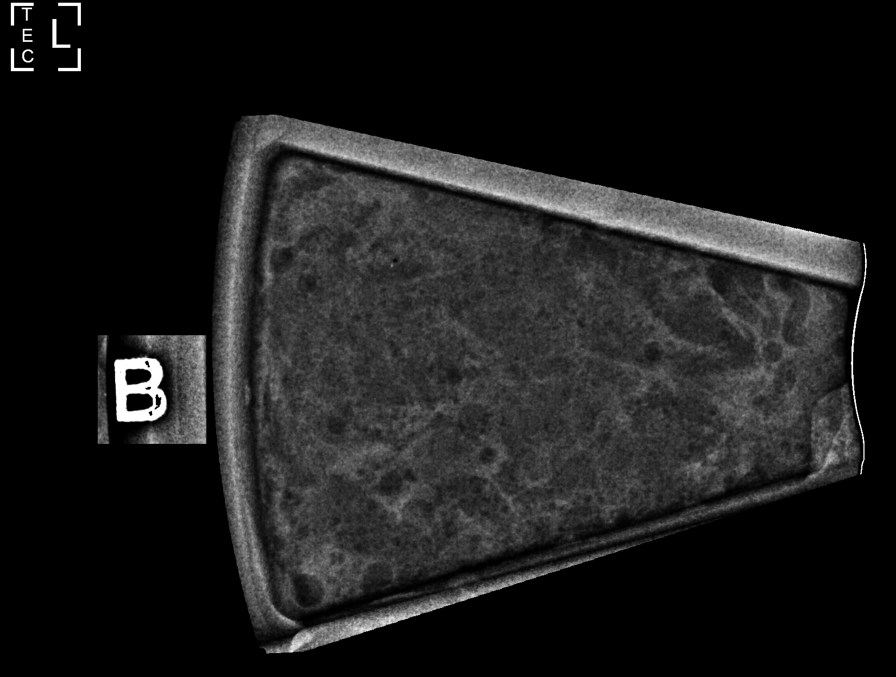

[L (3 of 6)]
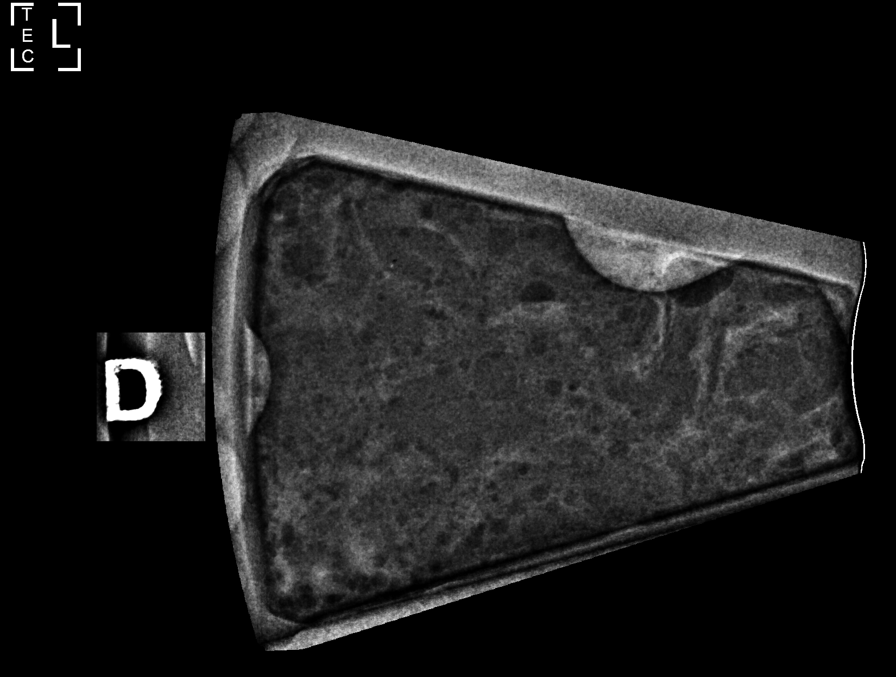

[L (4 of 6)]
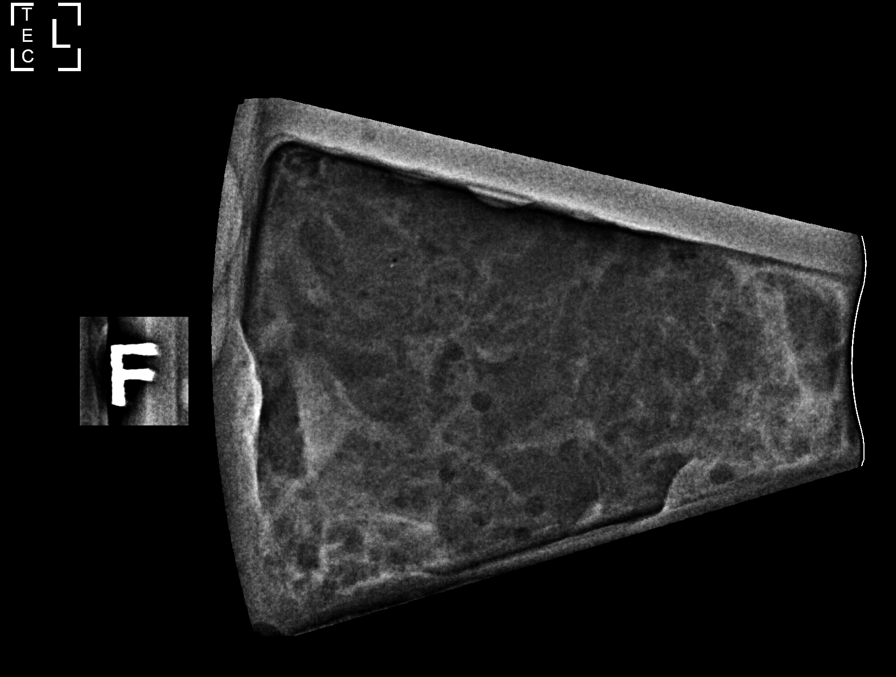

[L (5 of 6)]
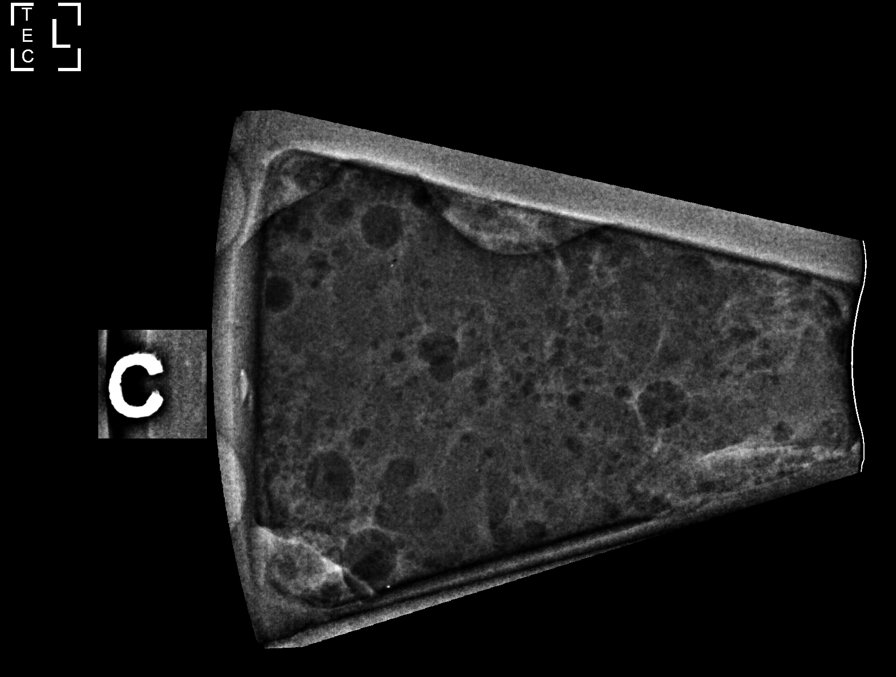

[L (6 of 6)]
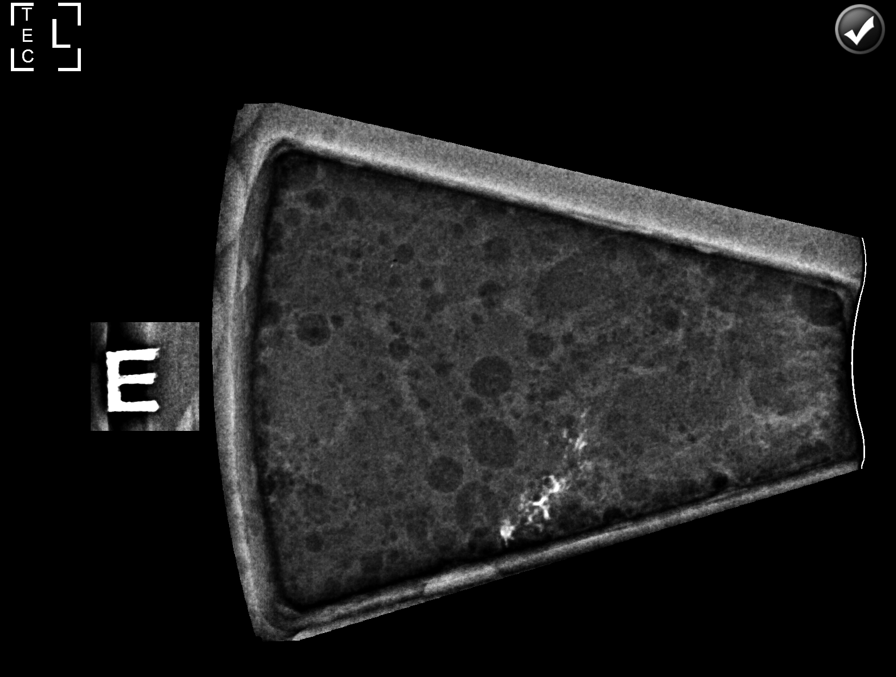

[L LM]
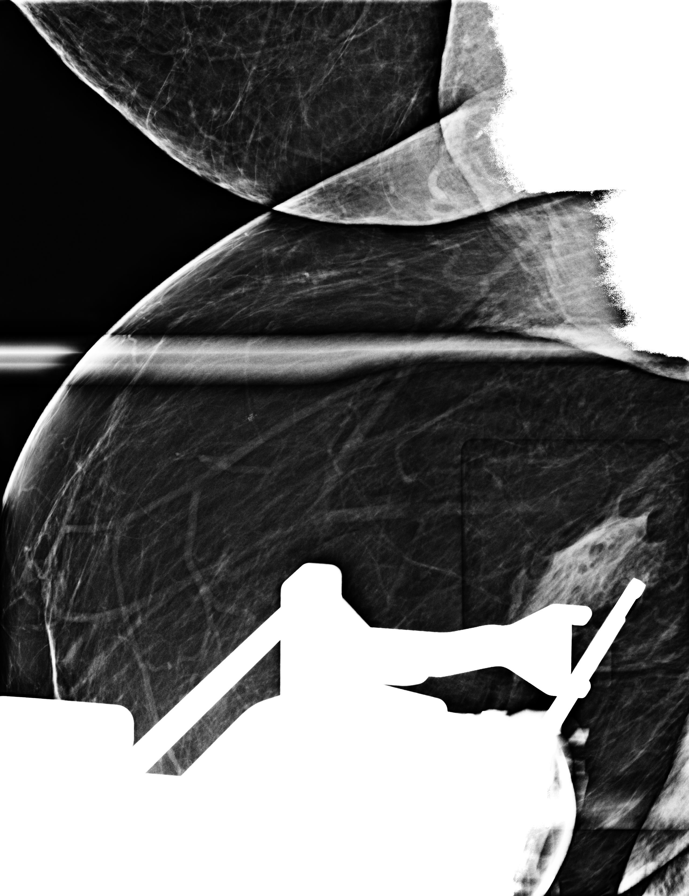

[7 of 33 positions shown; findings below may reference images not displayed]



Using sterile technique and 1% lidocaine and 1% lidocaine with
epinephrine as local anesthetic, under stereotactic guidance, a 9
gauge vacuum assisted device was used to perform core needle biopsy
of calcifications in the UPPER-OUTER QUADRANT of the LEFT breast and
placement of an X shaped clip using a LATERAL to MEDIAL approach.
Specimen radiograph was performed showing calcifications in numerous
tissue samples. Specimens with calcifications are identified for
pathology.

Lesion quadrant: LOWER OUTER QUADRANT LEFT breast

At the conclusion of the procedure, X shaped tissue marker clip was
deployed into the biopsy cavity. Follow-up 2-view mammogram was
performed and dictated separately.
IMPRESSION: Stereotactic-guided biopsy of LEFT breast calcifications. No
apparent complications.

ADDENDUM:
Previous studies performed at [facility] are now available.
Multiple studies are dated 04/03/2017 through 01/21/2014.

Comparison with these previous exams confirm 2 areas of
calcifications in the UPPER-OUTER QUADRANT of the LEFT breast, one
of which has now been biopsied. A second, stable group of benign
coarse calcifications is seen more superior and anterior to the
biopsied calcifications. No biopsy of this group of calcifications
is needed.

Pathology from stereotactic biopsy is pending.

ADDENDUM:
Pathology revealed FAT NECROSIS WITH CALCIFICATIONS AND OSTEOID
FORMATION of the LEFT breast, upper outer quadrant, (x clip). This
was found to be concordant by Dr. Gipsz Trittremmel.

Pathology results were discussed with the patient by telephone. The
patient reported doing well after the biopsy with mild tenderness
and swelling at the site. Post biopsy instructions and care were
reviewed and questions were answered. The patient was encouraged to
call The [REDACTED] for any additional
concerns. My direct phone number was provided.

The patient was instructed to return for annual screening
mammography in September 2022.

Pathology results reported by Koffi Colin, RN on 11/02/2021.

*** End of Addendum ***
Addendum:
FINDINGS: The patient and I discussed the procedure of stereotactic-guided
biopsy including benefits and alternatives. We discussed the high
likelihood of a successful procedure. We discussed the risks of the
procedure including infection, bleeding, tissue injury, clip
migration, and inadequate sampling. Informed written consent was
given. The usual time out protocol was performed immediately prior
to the procedure.

Using sterile technique and 1% lidocaine and 1% lidocaine with
epinephrine as local anesthetic, under stereotactic guidance, a 9
gauge vacuum assisted device was used to perform core needle biopsy
of calcifications in the UPPER-OUTER QUADRANT of the LEFT breast and
placement of an X shaped clip using a LATERAL to MEDIAL approach.
Specimen radiograph was performed showing calcifications in numerous
tissue samples. Specimens with calcifications are identified for
pathology.

Lesion quadrant: LOWER OUTER QUADRANT LEFT breast

At the conclusion of the procedure, X shaped tissue marker clip was
deployed into the biopsy cavity. Follow-up 2-view mammogram was
performed and dictated separately.
IMPRESSION: Stereotactic-guided biopsy of LEFT breast calcifications. No
apparent complications.

ADDENDUM:
Previous studies performed at [facility] are now available.
Multiple studies are dated 04/03/2017 through 01/21/2014.

Comparison with these previous exams confirm 2 areas of
calcifications in the UPPER-OUTER QUADRANT of the LEFT breast, one
of which has now been biopsied. A second, stable group of benign
coarse calcifications is seen more superior and anterior to the
biopsied calcifications. No biopsy of this group of calcifications
is needed.

Pathology from stereotactic biopsy is pending.



Using sterile technique and 1% lidocaine and 1% lidocaine with
epinephrine as local anesthetic, under stereotactic guidance, a 9
gauge vacuum assisted device was used to perform core needle biopsy
of calcifications in the UPPER-OUTER QUADRANT of the LEFT breast and
placement of an X shaped clip using a LATERAL to MEDIAL approach.
Specimen radiograph was performed showing calcifications in numerous
tissue samples. Specimens with calcifications are identified for
pathology.

Lesion quadrant: LOWER OUTER QUADRANT LEFT breast

At the conclusion of the procedure, X shaped tissue marker clip was
deployed into the biopsy cavity. Follow-up 2-view mammogram was
performed and dictated separately.
IMPRESSION: Stereotactic-guided biopsy of LEFT breast calcifications. No
apparent complications.

## 2022-02-08 DIAGNOSIS — K7460 Unspecified cirrhosis of liver: Secondary | ICD-10-CM | POA: Diagnosis not present

## 2022-02-08 DIAGNOSIS — Z944 Liver transplant status: Secondary | ICD-10-CM | POA: Diagnosis not present

## 2022-02-11 ENCOUNTER — Inpatient Hospital Stay: Payer: Medicaid Other | Attending: Oncology

## 2022-02-11 DIAGNOSIS — C8331 Diffuse large B-cell lymphoma, lymph nodes of head, face, and neck: Secondary | ICD-10-CM | POA: Insufficient documentation

## 2022-02-11 DIAGNOSIS — Z95828 Presence of other vascular implants and grafts: Secondary | ICD-10-CM

## 2022-02-11 MED ORDER — SODIUM CHLORIDE 0.9% FLUSH
10.0000 mL | Freq: Once | INTRAVENOUS | Status: AC
  Administered 2022-02-11: 10 mL via INTRAVENOUS
  Filled 2022-02-11: qty 10

## 2022-02-11 MED ORDER — HEPARIN SOD (PORK) LOCK FLUSH 100 UNIT/ML IV SOLN
500.0000 [IU] | Freq: Once | INTRAVENOUS | Status: AC
  Administered 2022-02-11: 500 [IU] via INTRAVENOUS
  Filled 2022-02-11: qty 5

## 2022-02-12 ENCOUNTER — Telehealth: Payer: Self-pay | Admitting: Psychiatry

## 2022-02-12 ENCOUNTER — Encounter: Payer: Self-pay | Admitting: Physician Assistant

## 2022-02-12 ENCOUNTER — Ambulatory Visit (INDEPENDENT_AMBULATORY_CARE_PROVIDER_SITE_OTHER): Payer: Medicaid Other | Admitting: Physician Assistant

## 2022-02-12 ENCOUNTER — Other Ambulatory Visit: Payer: Self-pay | Admitting: Obstetrics and Gynecology

## 2022-02-12 VITALS — BP 122/85 | HR 76 | Temp 98.1°F | Ht 66.5 in | Wt 243.0 lb

## 2022-02-12 DIAGNOSIS — N390 Urinary tract infection, site not specified: Secondary | ICD-10-CM

## 2022-02-12 DIAGNOSIS — Z944 Liver transplant status: Secondary | ICD-10-CM | POA: Diagnosis not present

## 2022-02-12 DIAGNOSIS — Z5181 Encounter for therapeutic drug level monitoring: Secondary | ICD-10-CM | POA: Diagnosis not present

## 2022-02-12 LAB — MICROSCOPIC EXAMINATION

## 2022-02-12 LAB — URINALYSIS, COMPLETE
Bilirubin, UA: NEGATIVE
Glucose, UA: NEGATIVE
Ketones, UA: NEGATIVE
Nitrite, UA: POSITIVE — AB
Protein,UA: NEGATIVE
RBC, UA: NEGATIVE
Specific Gravity, UA: 1.01 (ref 1.005–1.030)
Urobilinogen, Ur: 0.2 mg/dL (ref 0.2–1.0)
pH, UA: 6 (ref 5.0–7.5)

## 2022-02-12 NOTE — Patient Outreach (Signed)
?Medicaid Managed Care   ?Nurse Care Manager Note ? ?02/12/2022 ?Name:  Alison [name] MRN:  [id number] DOB:  [birth year] ? ?Alison [name] is an 46 y.o. year old female who is a primary patient of Alison Merino, FNP.  [facility] Managed Care Coordination team was consulted for assistance with:    ?Chronic healthcare management needs, DM, chronic pain, OSA, migraines, anxiety/depression/bipolar, LDD, LBP, h/o lymphoma ? ?Alison [name] was given information about Medicaid Managed Care Coordination team services today. [name] Patient agreed to services and verbal consent obtained. ? ?Engaged with patient by telephone for follow up visit in response to provider referral for case management and/or care coordination services.  ? ?Assessments/Interventions:  Review of past medical history, allergies, medications, health status, including review of consultants reports, laboratory and other test data, was performed as part of comprehensive evaluation and provision of chronic care management services. ? ?SDOH (Social Determinants of Health) assessments and interventions performed: ?SDOH Interventions   ? ?Flowsheet Row Most Recent Value  ?SDOH Interventions   ?Housing Interventions Intervention Not Indicated  ? ?  ?Care Plan ? ?Allergies  ?Allergen Reactions  ? Penicillin G Itching, Rash, Anaphylaxis and Palpitations  ? Lamictal [Lamotrigine] Rash  ? Carbamazepine Other (See Comments)  ?  Medication interaction-prograf ?  ? Hydrocodone-Acetaminophen Itching  ? Naproxen Itching  ? ?Medications Reviewed Today   ? ? Reviewed by [name], RN (Registered Nurse) on 02/12/22 at 76  Med List Status: <None>  ? ?Medication Order Taking? Sig Documenting Provider Last Dose Status Informant  ?baclofen (LIORESAL) 10 MG tablet [id number] No Take 10 mg by mouth 2 (two) times daily. [provider] Taking Active Self  ?benztropine (COGENTIN) 1 MG tablet [id number] No Take 1 tablet (1 mg total) by mouth daily as  needed for tremors. [name], MD Taking Active Self  ?cholecalciferol (VITAMIN D3) 25 MCG (1000 UNIT) tablet [id number] No Take 1,000 Units by mouth in the morning and at bedtime. [provider] Taking Active Self  ?CRANBERRY PO [id number] No Take 1 capsule by mouth in the morning and at bedtime. [provider] Taking Active Self  ?Dulaglutide 4.5 MG/0.5ML SOPN [id number] No Inject 4.5 mg into the skin once a week. [provider] Taking Active   ?eszopiclone (LUNESTA) 1 MG TABS tablet [id number] No Take 1 tablet (1 mg total) by mouth at bedtime as needed. for sleep [name], MD Taking Active   ?ezetimibe (ZETIA) 10 MG tablet [id number] No Take 10 mg by mouth every morning. [provider] Taking Active Self  ?fluticasone (FLONASE) 50 MCG/ACT nasal spray [id number] No Place 1 spray into both nostrils daily as needed for allergies. [provider] Taking Active Self  ?icosapent Ethyl (VASCEPA) 1 g capsule [id number] No Take 2 capsules (2 g total) by mouth 2 (two) times daily. [name], MD Taking Active Self  ?lisinopril (ZESTRIL) 20 MG tablet [id number] No Take 1 tablet (20 mg total) by mouth daily. [name], PA-C Taking Active Self  ?lurasidone (LATUDA) 20 MG TABS tablet [id number] No Take 1 tablet (20 mg total) by mouth daily with supper. [name], MD Taking Active   ?Magnesium 500 MG CAPS [id number] No Take 500 mg by mouth in the morning and at bedtime. [provider] Taking Active Self  ?Melatonin 10 MG TABS [id number] No Take 10 mg by mouth at bedtime. [provider] Taking Active Self  ?metFORMIN (GLUCOPHAGE) 1000 MG tablet [id number] No TAKE ONE TABLET BY  MOUTH TWICE DAILY with meals Alison Merino, FNP Taking Active Self  ?MYRBETRIQ 50 MG TB24 tablet [id number] No TAKE ONE TABLET BY MOUTH ONCE DAILY [name], Samantha, PA-C Taking Active   ?naloxone (NARCAN) nasal spray 4 mg/0.1 mL [id number] No Place 0.4 mg into the  nose once. [provider] Taking Active Self  ?oxyCODONE ER [facility] ER) 13.5 MG C12A [id number] No Take 13.5 mg by mouth 2 (two) times daily. [provider] Taking Active Self  ?pregabalin (LYRICA) 200 MG capsule [id number] No Take 1 capsule (200 mg total) by mouth 2 (two) times daily. [name], MD Taking Expired 01/02/22 2359   ?rosuvastatin (CRESTOR) 10 MG tablet [id number] No Take 1 tablet (10 mg total) by mouth daily. [name], MD Taking Active   ?tacrolimus (PROGRAF) 1 MG capsule [id number] No Take 1 capsule by mouth once daily [name], FNP Taking Active Self  ?         ?Med Note Alison [name], Alison [name]   Fri Jan 19, 2021  5:01 AM)    ?trimethoprim (TRIMPEX) 100 MG tablet [id number] No Take 1 tablet (100 mg total) by mouth daily. Alison Loser, MD Taking Active   ?UBRELVY 50 MG TABS [id number] No Take 1 tablet by mouth daily as needed. [provider] Taking Active   ?venlafaxine XR (EFFEXOR-XR) 37.5 MG 24 hr capsule [id number] No TAKE ONE CAPSULE BY MOUTH ONCE DAILY WITH BREAKFAST [name], MD Taking Active   ?XYLIMELTS 550 MG DISK [id number] No Take 550 mg by mouth every 8 (eight) hours as needed (dry mouth). [provider] Taking Active Self  ? ?  ?  ? ?  ? ?Patient Active Problem List  ? Diagnosis Date Noted  ? Menorrhagia with irregular cycle   ? Akathisia 10/24/2021  ? At risk for prolonged QT interval syndrome 09/12/2021  ? Prolonged Q-T interval on ECG 08/29/2021  ? Insomnia 07/20/2021  ? Bipolar disorder, in full remission, most recent episode mixed ([place]) 07/20/2021  ? Bipolar 1 disorder, mixed, mild ([place]) 05/09/2021  ? GAD (generalized anxiety disorder) 05/09/2021  ? Hepatic cirrhosis ([place])   ? Opioid dependence with opioid-induced disorder ([place]) 12/20/2020  ? 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 Bipolar disorder, in full remission, most recent episode depressed ([place]) 09/27/2020  ? Suprapubic pain 07/25/2020   ? Bipolar 1 disorder, depressed, mild ([place]) 07/10/2020  ? 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 Edema of lower extremity 09/16/2019  ? Carpal tunnel syndrome, left 07/26/2019  ? Cubital tunnel syndrome on left 07/26/2019  ? Bipolar I disorder, most recent episode depressed ([place]) 07/12/2019  ? PTSD (post-traumatic stress disorder) 07/12/2019  ? Umbilical hernia without obstruction and without gangrene 06/17/2019  ? Encounter for surveillance of abnormal nevi 06/17/2019  ? Pineal gland cyst 06/17/2019  ? Chronic hip pain, left 06/02/2019  ? Lumbar spondylosis 06/02/2019  ? Mouth dryness 06/02/2019  ? Obesity (BMI 35.0-39.9 without comorbidity) 06/02/2019  ? Goals of care, counseling/discussion 05/18/2019  ? 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) 05/18/2019  ? 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 Occipital neuralgia 05/13/2019  ? Plantar fasciitis of left foot 04/27/2019  ? Fibromyalgia 03/23/2019  ? Systemic lupus erythematosus (SLE) in adult [facility]) 03/23/2019  ? Essential hypertension 03/23/2019  ? Hepatitis 03/23/2019  ? Mass of left kidney 03/23/2019  ? Diabetes mellitus ([place]) 03/23/2019  ? Nausea without vomiting 03/23/2019  ? Neuropathy 03/23/2019  ? Cancer ([place]) 03/23/2019  ? Hyperlipidemia  03/23/2019  ? Osteoarthritis 03/23/2019  ? Trigeminal neuralgia 03/23/2019  ? Chronic renal disease, stage III ([place]) 03/23/2019  ? Nephrolithiasis 03/23/2019  ? Migraine 03/23/2019  ? OSA on CPAP 03/23/2019  ? Fibrocystic breast 03/23/2019  ? Heart murmur 03/23/2019  ? Bipolar disorder, in partial remission, most recent episode depressed ([place]) 07/02/2017  ? Abnormal uterine bleeding 03/18/2017  ? Major depressive disorder, recurrent ([place]) 03/18/2017  ? Morbid obesity ([place]) 03/18/2017  ? Secondary hyperparathyroidism ([place]) 02/07/2017  ? Neuropathic pain 11/19/2016  ? Post herpetic neuralgia 11/19/2016  ? 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[place])  01/13/2015  ? Lumbar disc disease with radiculopathy 07/26/2013  ? S/P liver transplant ([place]) 07/26/2013  ? Type 2 diabetes mellitus, uncontrolled 07/26/2013  ? Facial nerve disorder 06/29/2012  ? Lo

## 2022-02-12 NOTE — Patient Instructions (Signed)
Hi Alison [name] to speak with you-I hope you feel better!! ? ?Alison [name] was given information about Medicaid Managed Care team care coordination services as a part of their Healthy [facility] Medicaid benefit. [name] verbally consented to engagement with the [facility] Managed Care team.  ? ?If you are experiencing a medical emergency, please call 911 or report to your local emergency department or urgent care.  ? ?If you have a non-emergency medical problem during routine business hours, please contact your provider's office and ask to speak with a nurse.  ? ?For questions related to your Healthy [facility] health plan, please call: [phone] or visit the homepage here: [email or web address] ? ?If you would like to schedule transportation through your Healthy [facility] plan, please call the following number at least 2 days in advance of your appointment: [phone] ? For information about your ride after you set it up, call Ride Assist at [phone]. Use this number to activate a Will Call pickup, or if your transportation is late for a scheduled pickup. Use this number, too, if you need to make a change or cancel a previously scheduled reservation. ? If you need transportation services right away, call [phone]. The after-hours call center is staffed 24 hours to handle ride assistance and urgent reservation requests (including discharges) 365 days a year. Urgent trips include sick visits, hospital discharge requests and life-sustaining treatment. ? ?Call the [place] at [phone], at any time, 24 hours a day, 7 days a week. If you are in danger or need immediate medical attention call 911. ? ?If you would like help to quit smoking, call 1-800-QUIT-NOW 209-084-8565) OR Espa?ol: 1-855-D?jelo-Ya 971-606-6071) o para m?s informaci?n haga clic aqu? or Text READY to 200-400 to register via text ? ?Alison [name] - following are the  goals we discussed in your visit today:  ? Goals Addressed   ? ?  ?  ?  ?  ? This Visit's Progress  ?  Chronic Pain Managed     ?  02/12/22:  No change in hip pain, controlled with meds-still walking every day ? ? ?  ?  Get sugars at goal     ?  Timeframe:  Short-Term Goal ?Priority:  High ?Start Date:                             ?Expected End Date:      ongoing                ? ?Follow Up Date Monthly 03/15/22 ?  ?- call for medicine refill 2 or 3 days before it runs out ?- call if I am sick and can't take my medicine ?- keep a list of all the medicines I take; vitamins and herbals too  ?  ?Why is this important?   ?These steps will help you keep on track with your medicines. ?  ?02/12/22, Patient checks blood sugars inconsistently-every 3-4 days, 98-205, trying to walk every day  ? ?Patient verbalizes understanding of instructions and care plan provided today and agrees to view in [place]. Active MyChart status confirmed with patient.   ? ?The Managed Medicaid care management team will reach out to the patient again over the next 30 days.  ?The  Patient has been provided with contact information for the Managed Medicaid care management team and has been advised to call with any health related questions or concerns.  ? ?Alison [name] Quarry manager  RN, [name] ?[facility] ?Care Management Coordinator - Managed Medicaid High Risk ?[phone] ?  ?Following is a copy of your plan of care:  ?Care Plan : Chronic Pain (Adult)  ?Updates made by [name], RN since 02/12/2022 12:00 AM  ?  ? ?Problem: Chronic Pain Management-fibromyalgia   ?Priority: High  ?Onset Date: 01/22/2021  ?  ? ?Long-Range Goal: Fibromyalgia pain managed-new pain management provider   ?Start Date: 08/14/2020  ?Expected End Date: 05/15/2022  ?Recent Progress: On track  ?Priority: High  ?Note:   ?Current Barriers:  ?Knowledge deficits related to pain management  ?02/12/22:  No change in hip pain-continues to walk-controlled with meds ? ?Nurse Case  Manager Clinical Goal(s):  ?Over the next 45 days, patient will work with [facility] to address needs related to referral for pain management provider and associated care coordination needs. ?Update 09/22/20:  patient is currently seeing Dr. Humphrey Rolls at [facility]. ? ?Interventions:  ?Inter-disciplinary care team collaboration (see longitudinal plan of care) ?Evaluation of current treatment plan related to fibromyalgia  and patient's adherence to plan as established by provider. ?Update 06/15/21:  Patient taking Baclofen again, Effexor added. ?Collaborated with primary care provider regarding recommendations and referral to pain management provider. ?Discussed plans with patient for ongoing care management follow up and provided patient with direct contact information for care management team ?Anticipate pain education program, pain management support as part of pain management referral.      ? ?Patient Goals/Self-Care Activities ?Over the next 45 days, patient will: ? -Attends all scheduled provider appointments ? develop a personal pain management plan with your pain management provider. ? ?Follow Up Plan:  ?The Managed Medicaid care management team will reach out to the patient again over the next 30 days.  ? ?  ?

## 2022-02-12 NOTE — Progress Notes (Signed)
? ?02/12/2022 ?4:01 PM  ? ?Alison [name] ?1976/01/07 ?[id number] ? ?CC: ?Chief Complaint  ?Patient presents with  ? Recurrent UTI  ? ?HPI: ?Alison [name] is a 46 y.o. female with PMH current UTI, OAB, and renal masses who presents today for evaluation of possible UTI.  ? ?Today she reports an approximate 2-week history of frequency, malodorous urine, urgency, chills, and bladder spasms without dysuria.  She denies fever, nausea, or vomiting.  She took 3 days of Cipro when her symptoms first started, which largely resolved her symptoms, but then they returned. ? ?She was previously prescribed suppressive trimethoprim by Dr. Matilde [name], however she ran out of this and needs to refill it.  She remains on Myrbetriq 50 mg daily. She reports frustration with her frequency of infection. ? ?Her most recent positive urine culture, dated 11/26/2021, grew E faecalis susceptible only to vancomycin. ? ?In-office UA today positive for nitrites and 2+ leukocyte esterase; urine microscopy with 11-30 WBCs/HPF, 3-10 RBCs/HPF, and many bacteria.  ? ?PMH: ?Past Medical History:  ?Diagnosis Date  ? Abnormal uterine bleeding   ? Allergy   ? Anxiety   ? Arthritis   ? Bipolar disorder (manic depression) ([place])   ? Chronic kidney failure   ? Chronic renal disease, stage III ([place])   ? Diabetes mellitus without complication ([place])   ? 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[place]) 2015  ? Right axillary lymph node resected and chemo tx's.  ? Dyspnea   ? FH: trigeminal neuralgia   ? GERD (gastroesophageal reflux disease)   ? Heart murmur   ? Hepatic cirrhosis ([place])   ? History of kidney stones   ? Hypertension   ? Kidney mass   ? Long Q-T syndrome   ? 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 Lupus ([place])   ? Major depressive disorder   ? 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[place]) 06/2019  ? Chemo tx's  ? Migraine   ? Morbid obesity ([place])   ? Neuromuscular disorder ([place])   ? neuropathy  ? Neuropathy   ? Personality disorder ([place])   ? Pineal gland cyst   ? Post  herpetic neuralgia   ? PTSD (post-traumatic stress disorder)   ? Renal disorder   ? S/P liver transplant ([place]) 1993  ? Sleep apnea   ? has not recieved cpap yet-other one was recalled  ? ? ?Surgical History: ?Past Surgical History:  ?Procedure Laterality Date  ? BONE MARROW BIOPSY  01/13/2015  ? BREAST BIOPSY  12/2014  ? BREAST BIOPSY  2011  ? BREAST SURGERY    ? CHOLECYSTECTOMY    ? COLONOSCOPY    ? ESOPHAGOGASTRODUODENOSCOPY    ? ESOPHAGOGASTRODUODENOSCOPY (EGD) WITH PROPOFOL N/A 01/09/2021  ? Procedure: ESOPHAGOGASTRODUODENOSCOPY (EGD) WITH PROPOFOL;  Surgeon: [name], MD;  Location: [facility] ENDOSCOPY;  Service: Endoscopy;  Laterality: N/A;  ? HERNIA REPAIR    ? x4-all from liver transplant  ? HYSTEROSCOPY WITH D & C N/A 11/13/2021  ? Procedure: DILATATION AND CURETTAGE /HYSTEROSCOPY;  Surgeon: Homero Fellers, MD;  Location: [facility] ORS;  Service: Gynecology;  Laterality: N/A;  ? infusaport    ? LIVER TRANSPLANT  12/17/1991  ? LUMBAR PUNCTURE    ? PORT A CATH INJECTION ([place] HX)    ? RENAL BIOPSY    ? tumor removal  2015  ? cancer right side  ? ? ?Home Medications:  ?Allergies as of 02/12/2022   ? ?   Reactions  ? Penicillin G Itching, Rash, Anaphylaxis, Palpitations  ?  Lamictal [lamotrigine] Rash  ? Carbamazepine Other (See Comments)  ? Medication interaction-prograf  ? Hydrocodone-acetaminophen Itching  ? Naproxen Itching  ? ?  ? ?  ?Medication List  ?  ? ?  ? Accurate as of Feb 12, 2022  4:01 PM. If you have any questions, ask your nurse or doctor.  ?  ?  ? ?  ? ?baclofen 10 MG tablet ?Commonly known as: LIORESAL ?Take 10 mg by mouth 2 (two) times daily. ?  ?benztropine 1 MG tablet ?Commonly known as: COGENTIN ?Take 1 tablet (1 mg total) by mouth daily as needed for tremors. ?  ?cholecalciferol 25 MCG (1000 UNIT) tablet ?Commonly known as: VITAMIN D3 ?Take 1,000 Units by mouth in the morning and at bedtime. ?  ?CRANBERRY PO ?Take 1 capsule by mouth in the morning and at bedtime. ?  ?Dulaglutide 4.5 MG/0.5ML  Sopn ?Inject 4.5 mg into the skin once a week. ?  ?eszopiclone 1 MG Tabs tablet ?Commonly known as: LUNESTA ?Take 1 tablet (1 mg total) by mouth at bedtime as needed. for sleep ?  ?ezetimibe 10 MG tablet ?Commonly known as: ZETIA ?Take 10 mg by mouth every morning. ?  ?fluticasone 50 MCG/ACT nasal spray ?Commonly known as: FLONASE ?Place 1 spray into both nostrils daily as needed for allergies. ?  ?icosapent Ethyl 1 g capsule ?Commonly known as: VASCEPA ?Take 2 capsules (2 g total) by mouth 2 (two) times daily. ?  ?lisinopril 20 MG tablet ?Commonly known as: ZESTRIL ?Take 1 tablet (20 mg total) by mouth daily. ?  ?lurasidone 20 MG Tabs tablet ?Commonly known as: LATUDA ?Take 1 tablet (20 mg total) by mouth daily with supper. ?  ?Magnesium 500 MG Caps ?Take 500 mg by mouth in the morning and at bedtime. ?  ?Melatonin 10 MG Tabs ?Take 10 mg by mouth at bedtime. ?  ?metFORMIN 1000 MG tablet ?Commonly known as: GLUCOPHAGE ?TAKE ONE TABLET BY MOUTH TWICE DAILY with meals ?  ?Myrbetriq 50 MG Tb24 tablet ?Generic drug: mirabegron ER ?TAKE ONE TABLET BY MOUTH ONCE DAILY ?  ?naloxone 4 MG/0.1ML Liqd nasal spray kit ?Commonly known as: NARCAN ?Place 0.4 mg into the nose once. ?  ?pregabalin 200 MG capsule ?Commonly known as: LYRICA ?Take 1 capsule (200 mg total) by mouth 2 (two) times daily. ?  ?rosuvastatin 10 MG tablet ?Commonly known as: Crestor ?Take 1 tablet (10 mg total) by mouth daily. ?  ?tacrolimus 1 MG capsule ?Commonly known as: PROGRAF ?Take 1 capsule by mouth once daily ?  ?trimethoprim 100 MG tablet ?Commonly known as: TRIMPEX ?Take 1 tablet (100 mg total) by mouth daily. ?  ?Ubrelvy 50 MG Tabs ?Generic drug: Ubrogepant ?Take 1 tablet by mouth daily as needed. ?  ?venlafaxine XR 37.5 MG 24 hr capsule ?Commonly known as: EFFEXOR-XR ?TAKE ONE CAPSULE BY MOUTH ONCE DAILY WITH BREAKFAST ?  ?Xtampza ER 13.5 MG C12a ?Generic drug: oxyCODONE ER ?Take 13.5 mg by mouth 2 (two) times daily. ?  ?XyliMelts 550 MG  Disk ?Generic drug: Xylitol ?Take 550 mg by mouth every 8 (eight) hours as needed (dry mouth). ?  ? ?  ? ? ?Allergies:  ?Allergies  ?Allergen Reactions  ? Penicillin G Itching, Rash, Anaphylaxis and Palpitations  ? Lamictal [Lamotrigine] Rash  ? Carbamazepine Other (See Comments)  ?  Medication interaction-prograf ?  ? Hydrocodone-Acetaminophen Itching  ? Naproxen Itching  ? ? ?Family History: ?Family History  ?Problem Relation Age of Onset  ? Heart disease Mother   ?  Hypertension Mother   ? Cancer - Other Mother   ? Bipolar disorder Mother   ? Heart disease Father   ? Hypertension Father   ? Diabetes Father   ? Parkinson's disease Maternal Grandmother   ? Cancer Maternal Aunt   ? Cancer Maternal Uncle   ? Cancer Maternal Grandfather   ? Lupus Paternal Grandmother   ? Hypertension Brother   ? ? ?Social History:  ? reports that she has never smoked. She has never used smokeless tobacco. She reports that she does not currently use alcohol. She reports that she does not currently use drugs after having used the following drugs: Marijuana. ? ?Physical Exam: ?BP 122/85   Pulse 76   Temp 98.1 ?F (36.7 ?C) (Oral)   Ht 5' 6.5" (1.689 m)   Wt 243 lb (110.2 kg)   BMI 38.63 kg/m?   ?Constitutional:  Alert and oriented, no acute distress, nontoxic appearing ?HEENT: [place], AT ?Cardiovascular: No clubbing, cyanosis, or edema ?Respiratory: Normal respiratory effort, no increased work of breathing ?Skin: No rashes, bruises or suspicious lesions ?Neurologic: Grossly intact, no focal deficits, moving all 4 extremities ?Psychiatric: Normal mood and affect ? ?Laboratory Data: ?Results for orders placed or performed in visit on 02/12/22  ?Microscopic Examination  ? Urine  ?Result Value Ref Range  ? WBC, UA 11-30 (A) 0 - 5 /hpf  ? RBC 3-10 (A) 0 - 2 /hpf  ? Epithelial Cells (non renal) 0-10 0 - 10 /hpf  ? Bacteria, UA Many (A) None seen/Few  ?Urinalysis, Complete  ?Result Value Ref Range  ? Specific Gravity, UA 1.010 1.005 - 1.030  ?  pH, UA 6.0 5.0 - 7.5  ? Color, UA Yellow Yellow  ? Appearance Ur Clear Clear  ? Leukocytes,UA 2+ (A) Negative  ? Protein,UA Negative Negative/Trace  ? Glucose, UA Negative Negative  ? Ketones, UA Negative Negative  ? RBC,

## 2022-02-12 NOTE — Telephone Encounter (Signed)
Received a letter in an envelope from patient reporting she wants medication changes. ? ?I have communicated with staff at front desk-[place] to contact this patient to schedule a sooner appointment. ?

## 2022-02-13 DIAGNOSIS — F4310 Post-traumatic stress disorder, unspecified: Secondary | ICD-10-CM | POA: Diagnosis not present

## 2022-02-13 DIAGNOSIS — F3181 Bipolar II disorder: Secondary | ICD-10-CM | POA: Diagnosis not present

## 2022-02-13 DIAGNOSIS — F411 Generalized anxiety disorder: Secondary | ICD-10-CM | POA: Diagnosis not present

## 2022-02-14 ENCOUNTER — Encounter: Payer: Self-pay | Admitting: Psychiatry

## 2022-02-14 ENCOUNTER — Telehealth (INDEPENDENT_AMBULATORY_CARE_PROVIDER_SITE_OTHER): Payer: Medicaid Other | Admitting: Psychiatry

## 2022-02-14 ENCOUNTER — Encounter (INDEPENDENT_AMBULATORY_CARE_PROVIDER_SITE_OTHER): Payer: Self-pay | Admitting: Cardiology

## 2022-02-14 DIAGNOSIS — Z9189 Other specified personal risk factors, not elsewhere classified: Secondary | ICD-10-CM

## 2022-02-14 DIAGNOSIS — F411 Generalized anxiety disorder: Secondary | ICD-10-CM | POA: Diagnosis not present

## 2022-02-14 DIAGNOSIS — F4310 Post-traumatic stress disorder, unspecified: Secondary | ICD-10-CM | POA: Diagnosis not present

## 2022-02-14 DIAGNOSIS — F3178 Bipolar disorder, in full remission, most recent episode mixed: Secondary | ICD-10-CM

## 2022-02-14 DIAGNOSIS — G4701 Insomnia due to medical condition: Secondary | ICD-10-CM

## 2022-02-14 MED ORDER — VENLAFAXINE HCL ER 75 MG PO CP24: 75.0000 mg | ORAL_CAPSULE | Freq: Every day | ORAL | 0 refills | Status: DC

## 2022-02-14 MED ORDER — ESZOPICLONE 2 MG PO TABS: 2.0000 mg | ORAL_TABLET | Freq: Every evening | ORAL | 1 refills | Status: DC | PRN

## 2022-02-14 NOTE — Progress Notes (Signed)
Virtual Visit via Video Note ? ?I connected with Alison [name] on 02/14/22 at  1:30 PM EDT by a video enabled telemedicine application and verified that I am speaking with the correct person using two identifiers. ? ?Location ?Provider Location : [facility] ?Patient Location : Home ? ?Participants: Patient , Provider ? ?  ?I discussed the limitations of evaluation and management by telemedicine and the availability of in person appointments. The patient expressed understanding and agreed to proceed. ?  ?I discussed the assessment and treatment plan with the patient. The patient was provided an opportunity to ask questions and all were answered. The patient agreed with the plan and demonstrated an understanding of the instructions. ?  ?The patient was advised to call back or seek an in-person evaluation if the symptoms worsen or if the condition fails to improve as anticipated. ? ? ?[place] MD OP Progress Note ? ?02/14/2022 3:10 PM ?Alison [name]  ?MRN:  [id number] ? ?Chief Complaint:  ?Chief Complaint  ?Patient presents with  ? Follow-up: 46 year old Caucasian female who has a history of bipolar disorder, PTSD, sleep problem, multiple medical problems with recent problems with CPAP mask triggering her anxiety presented for medication management.  ? ?HPI: Alison [name] is a 46 year old Caucasian female, lives in [place], has a history of bipolar disorder, PTSD, multiple medical problems including fibromyalgia, chronic pain, trigeminal neuralgia, history of liver transplant, B-cell lymphoma, SLE, diabetes mellitus, OSA, chronic migraine headaches, mixed hyperlipidemia, hypertension, was evaluated by telemedicine today. ? ?Patient was scheduled for this appointment since she contacted the office reporting problems with sleep, anxiety being triggered by her CPAP mask. ? ?Patient today reports that she recently got a new CPAP device however she has been having trouble with the mask.  She was given 3 different mask and  she is currently trying to figure out what fits the best for her.  Patient reports she hence has been having a lot of anxiety regarding this.  Patient also reports sleep problems.  Most of the nights she has been sleeping only 3 to 4 hours or so.  Last night she was able to sleep 6 hours which was an improvement. ? ?Patient denies any significant sadness or crying spells.  Denies any anhedonia. ? ?Reports appetite is good. ? ?Denies suicidality, homicidality or perceptual disturbances. ? ?Currently compliant on medications. ? ?Denies side effects. ? ?Patient with history of QT prolongation, currently under the care of cardiology.  Patient to contact cardiologist for clearance prior to further medication readjustment of her medications like venlafaxine.  She will also need repeat EKG for monitoring. ? ? ? ?Visit Diagnosis:  ?  ICD-10-CM   ?1. Bipolar disorder, in full remission, most recent episode mixed ([place])  F31.78   ?  ?2. PTSD (post-traumatic stress disorder)  F43.10 venlafaxine XR (EFFEXOR XR) 75 MG 24 hr capsule  ?  eszopiclone (LUNESTA) 2 MG TABS tablet  ?  ?3. GAD (generalized anxiety disorder)  F41.1 venlafaxine XR (EFFEXOR XR) 75 MG 24 hr capsule  ?  eszopiclone (LUNESTA) 2 MG TABS tablet  ?  ?4. Insomnia due to medical condition  G47.01 eszopiclone (LUNESTA) 2 MG TABS tablet  ? OSA, CPAP mask problem, anxiety.  ?  ?5. At risk for prolonged QT interval syndrome  Z91.89 EKG 12-Lead  ?  ? ? ?Past Psychiatric History: Reviewed past psychiatric history from progress note on 06/10/2019. ? ?Past Medical History:  ?Past Medical History:  ?Diagnosis Date  ? Abnormal uterine bleeding   ?  Allergy   ? Anxiety   ? Arthritis   ? Bipolar disorder (manic depression) ([place])   ? Chronic kidney failure   ? Chronic renal disease, stage III ([place])   ? Diabetes mellitus without complication ([place])   ? 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 Right axillary lymph node resected and chemo tx's.  ? Dyspnea   ? FH: trigeminal  neuralgia   ? GERD (gastroesophageal reflux disease)   ? Heart murmur   ? Hepatic cirrhosis ([place])   ? History of kidney stones   ? Hypertension   ? Kidney mass   ? Long Q-T syndrome   ? 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 Lupus ([place])   ? Major depressive disorder   ? 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 Chemo tx's  ? Migraine   ? Morbid obesity ([place])   ? Neuromuscular disorder ([place])   ? neuropathy  ? Neuropathy   ? Personality disorder ([place])   ? Pineal gland cyst   ? Post herpetic neuralgia   ? PTSD (post-traumatic stress disorder)   ? Renal disorder   ? S/P liver transplant ([place]) 1993  ? Sleep apnea   ? has not recieved cpap yet-other one was recalled  ?  ?Past Surgical History:  ?Procedure Laterality Date  ? BONE MARROW BIOPSY  01/13/2015  ? BREAST BIOPSY  12/2014  ? BREAST BIOPSY  2011  ? BREAST SURGERY    ? CHOLECYSTECTOMY    ? COLONOSCOPY    ? ESOPHAGOGASTRODUODENOSCOPY    ? ESOPHAGOGASTRODUODENOSCOPY (EGD) WITH PROPOFOL N/A 01/09/2021  ? Procedure: ESOPHAGOGASTRODUODENOSCOPY (EGD) WITH PROPOFOL;  Surgeon: [name], MD;  Location: [facility] ENDOSCOPY;  Service: Endoscopy;  Laterality: N/A;  ? HERNIA REPAIR    ? x4-all from liver transplant  ? HYSTEROSCOPY WITH D & C N/A 11/13/2021  ? Procedure: DILATATION AND CURETTAGE /HYSTEROSCOPY;  Surgeon: Homero Fellers, MD;  Location: [facility] ORS;  Service: Gynecology;  Laterality: N/A;  ? infusaport    ? LIVER TRANSPLANT  12/17/1991  ? LUMBAR PUNCTURE    ? PORT A CATH INJECTION ([place] HX)    ? RENAL BIOPSY    ? tumor removal  2015  ? cancer right side  ? ? ?Family Psychiatric History: Reviewed family psychiatric history from progress note on 06/10/2019. ? ?Family History:  ?Family History  ?Problem Relation Age of Onset  ? Heart disease Mother   ? Hypertension Mother   ? Cancer - Other Mother   ? Bipolar disorder Mother   ? Heart disease Father   ? Hypertension Father   ? Diabetes Father   ? Parkinson's disease Maternal Grandmother   ? Cancer Maternal Aunt   ? Cancer  Maternal Uncle   ? Cancer Maternal Grandfather   ? Lupus Paternal Grandmother   ? Hypertension Brother   ? ? ?Social History: Reviewed social history from progress note on 06/10/2019. ?Social History  ? ?Socioeconomic History  ? Marital status: Single  ?  Spouse name: Not on file  ? Number of children: 0  ? Years of education: 37  ? Highest education level: GED or equivalent  ?Occupational History  ? Occupation: disabled  ?Tobacco Use  ? Smoking status: Never  ? Smokeless tobacco: Never  ?Vaping Use  ? Vaping Use: Never used  ?Substance and Sexual Activity  ? Alcohol use: Not Currently  ? Drug use: Not Currently  ?  Types: Marijuana  ?  Comment: pain managment last used in  early april  ? Sexual activity: Not Currently  ?Other Topics Concern  ? Not on file  ?Social History Narrative  ? Not on file  ? ?Social Determinants of Health  ? ?Financial Resource Strain: Low Risk   ? Difficulty of Paying Living Expenses: Not very hard  ?Food Insecurity: No Food Insecurity  ? Worried About Charity fundraiser in the Last Year: Never true  ? Ran Out of Food in the Last Year: Never true  ?Transportation Needs: No Transportation Needs  ? Lack of Transportation (Medical): No  ? Lack of Transportation (Non-Medical): No  ?Physical Activity: Insufficiently Active  ? Days of Exercise per Week: 7 days  ? Minutes of Exercise per Session: 20 min  ?Stress: No Stress Concern Present  ? Feeling of Stress : Only a little  ?Social Connections: Moderately Integrated  ? Frequency of Communication with Friends and Family: More than three times a week  ? Frequency of Social Gatherings with Friends and Family: More than three times a week  ? Attends Religious Services: More than 4 times per year  ? Active Member of Clubs or Organizations: No  ? Attends Archivist Meetings: Never  ? Marital Status: Living with partner  ? ? ?Allergies:  ?Allergies  ?Allergen Reactions  ? Penicillin G Itching, Rash, Anaphylaxis and Palpitations  ? Lamictal  [Lamotrigine] Rash  ? Carbamazepine Other (See Comments)  ?  Medication interaction-prograf ?  ? Hydrocodone-Acetaminophen Itching  ? Naproxen Itching  ? ? ?Metabolic Disorder Labs: ?Lab Results  ?Component

## 2022-02-15 ENCOUNTER — Other Ambulatory Visit: Payer: Self-pay | Admitting: *Deleted

## 2022-02-15 LAB — CULTURE, URINE COMPREHENSIVE

## 2022-02-15 MED ORDER — CEFUROXIME AXETIL 250 MG PO TABS: 250.0000 mg | ORAL_TABLET | Freq: Two times a day (BID) | ORAL | 0 refills | Status: AC

## 2022-02-24 DIAGNOSIS — G4733 Obstructive sleep apnea (adult) (pediatric): Secondary | ICD-10-CM | POA: Diagnosis not present

## 2022-02-25 ENCOUNTER — Other Ambulatory Visit: Payer: Self-pay

## 2022-02-27 ENCOUNTER — Encounter: Payer: Self-pay | Admitting: Gastroenterology

## 2022-02-28 ENCOUNTER — Encounter
Admission: RE | Disposition: A | Discharge status: Home / Self Care | Payer: Self-pay | Source: Ambulatory Visit | Attending: Gastroenterology

## 2022-02-28 ENCOUNTER — Ambulatory Visit
Admission: RE | Admit: 2022-02-28 | Discharge: 2022-02-28 | Disposition: A | Discharge status: Home / Self Care | Payer: Medicaid Other | Source: Ambulatory Visit | Attending: Gastroenterology | Admitting: Gastroenterology

## 2022-02-28 ENCOUNTER — Ambulatory Visit: Payer: Medicaid Other | Admitting: Anesthesiology

## 2022-02-28 ENCOUNTER — Encounter: Payer: Self-pay | Admitting: Gastroenterology

## 2022-02-28 DIAGNOSIS — Z1211 Encounter for screening for malignant neoplasm of colon: Secondary | ICD-10-CM | POA: Diagnosis not present

## 2022-02-28 DIAGNOSIS — E119 Type 2 diabetes mellitus without complications: Secondary | ICD-10-CM | POA: Insufficient documentation

## 2022-02-28 DIAGNOSIS — K219 Gastro-esophageal reflux disease without esophagitis: Secondary | ICD-10-CM | POA: Diagnosis not present

## 2022-02-28 DIAGNOSIS — K640 First degree hemorrhoids: Secondary | ICD-10-CM | POA: Insufficient documentation

## 2022-02-28 HISTORY — PX: COLONOSCOPY WITH PROPOFOL: SHX5780

## 2022-02-28 LAB — GLUCOSE, CAPILLARY: Glucose-Capillary: 111 mg/dL — ABNORMAL HIGH (ref 70–99)

## 2022-02-28 LAB — POCT PREGNANCY, URINE: Preg Test, Ur: NEGATIVE

## 2022-02-28 SURGERY — COLONOSCOPY WITH PROPOFOL
Anesthesia: General

## 2022-02-28 MED ORDER — PROPOFOL 500 MG/50ML IV EMUL
INTRAVENOUS | Status: DC | PRN
Start: 2022-02-28 — End: 2022-02-28
  Administered 2022-02-28: 130 ug/kg/min via INTRAVENOUS

## 2022-02-28 MED ORDER — PROPOFOL 10 MG/ML IV BOLUS
INTRAVENOUS | Status: DC | PRN
  Administered 2022-02-28: 30 mg via INTRAVENOUS
  Administered 2022-02-28: 70 mg via INTRAVENOUS

## 2022-02-28 MED ORDER — HEPARIN SOD (PORK) LOCK FLUSH 100 UNIT/ML IV SOLN
INTRAVENOUS | Status: AC
  Filled 2022-02-28: qty 5

## 2022-02-28 MED ORDER — HEPARIN SOD (PORK) LOCK FLUSH 100 UNIT/ML IV SOLN
250.0000 [IU] | INTRAVENOUS | Status: AC | PRN
  Administered 2022-02-28: 250 [IU]

## 2022-02-28 MED ORDER — EPHEDRINE SULFATE (PRESSORS) 50 MG/ML IJ SOLN
INTRAMUSCULAR | Status: DC | PRN
  Administered 2022-02-28: 10 mg via INTRAVENOUS

## 2022-02-28 MED ORDER — LIDOCAINE HCL (CARDIAC) PF 100 MG/5ML IV SOSY
PREFILLED_SYRINGE | INTRAVENOUS | Status: DC | PRN
  Administered 2022-02-28: 50 mg via INTRAVENOUS

## 2022-02-28 MED ORDER — SODIUM CHLORIDE 0.9 % IV SOLN: INTRAVENOUS | Status: DC

## 2022-02-28 MED ORDER — SODIUM CHLORIDE 0.9% FLUSH
10.0000 mL | INTRAVENOUS | Status: AC | PRN
  Administered 2022-02-28: 10 mL

## 2022-02-28 MED ORDER — EPHEDRINE 5 MG/ML INJ
INTRAVENOUS | Status: AC
  Filled 2022-02-28: qty 5

## 2022-02-28 MED ORDER — DEXMEDETOMIDINE HCL IN NACL 200 MCG/50ML IV SOLN
INTRAVENOUS | Status: DC | PRN
  Administered 2022-02-28: 12 ug via INTRAVENOUS

## 2022-02-28 NOTE — Anesthesia Preprocedure Evaluation (Signed)
Anesthesia Evaluation  Patient identified by MRN, date of birth, ID band Patient awake    Reviewed: Allergy & Precautions, NPO status , Patient's Chart, lab work & pertinent test results  Airway Mallampati: II  TM Distance: >3 FB Neck ROM: Full    Dental  (+) Edentulous Upper, Partial Lower   Pulmonary neg pulmonary ROS, sleep apnea ,    Pulmonary exam normal breath sounds clear to auscultation       Cardiovascular Exercise Tolerance: Good hypertension, Pt. on medications negative cardio ROS Normal cardiovascular exam Rhythm:Regular Rate:Normal     Neuro/Psych  Headaches, Anxiety Depression Bipolar Disorder negative neurological ROS  negative psych ROS   GI/Hepatic negative GI ROS, Neg liver ROS, GERD  ,(+) Hepatitis -S/p liver transplant 30 years ago   Endo/Other  negative endocrine ROSdiabetes, Type 2Morbid obesity  Renal/GU CRFRenal diseasenegative Renal ROS  negative genitourinary   Musculoskeletal  (+) Arthritis ,   Abdominal (+) + obese,   Peds negative pediatric ROS (+)  Hematology negative hematology ROS (+)   Anesthesia Other Findings Past Medical History: No date: Abnormal uterine bleeding No date: Allergy No date: Anxiety No date: Arthritis No date: Bipolar disorder (manic depression) (HCC) No date: Chronic kidney failure No date: Chronic renal disease, stage III (HCC) No date: Diabetes mellitus without complication ([place]) 8101: 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HCC)     Comment:  Right axillary lymph node resected and chemo tx's. 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) No date: Pineal gland cyst No date: Post herpetic neuralgia No date: PTSD (post-traumatic stress disorder) No date: Renal disorder 1993: S/P liver transplant ([place]) No date: Sleep apnea     Comment:  has not recieved cpap yet-other one was recalled  Past Surgical History: 01/13/2015: BONE MARROW BIOPSY 12/2014: BREAST BIOPSY 2011: BREAST BIOPSY No date: BREAST SURGERY No date: CHOLECYSTECTOMY No date: COLONOSCOPY No date: ESOPHAGOGASTRODUODENOSCOPY 01/09/2021: ESOPHAGOGASTRODUODENOSCOPY (EGD) WITH PROPOFOL; N/A     Comment:  Procedure: ESOPHAGOGASTRODUODENOSCOPY (EGD) WITH               PROPOFOL;  Surgeon: [name], MD;  Location: [facility]               ENDOSCOPY;  Service: Endoscopy;  Laterality: N/A; No date: HERNIA REPAIR     Comment:  x4-all from liver transplant 11/13/2021: HYSTEROSCOPY WITH D & C; N/A     Comment:  Procedure: DILATATION AND CURETTAGE /HYSTEROSCOPY;                Surgeon: Homero Fellers, MD;  Location: [facility] ORS;               Service: Gynecology;  Laterality: N/A; No date: infusaport 12/17/1991: LIVER TRANSPLANT No date: LUMBAR PUNCTURE No date: PORT A CATH INJECTION ([place] HX) No date: RENAL BIOPSY 2015: tumor removal     Comment:  cancer right side     Reproductive/Obstetrics negative OB ROS  Anesthesia Physical Anesthesia Plan  ASA: 3  Anesthesia Plan: General   Post-op Pain Management:    Induction: Intravenous  PONV Risk Score and Plan: Propofol infusion and TIVA  Airway Management Planned: Natural Airway  Additional Equipment:   Intra-op Plan:   Post-operative Plan:   Informed Consent: I have reviewed the patients History and Physical, chart,  labs and discussed the procedure including the risks, benefits and alternatives for the proposed anesthesia with the patient or authorized representative who has indicated his/her understanding and acceptance.     Dental Advisory Given  Plan Discussed with: CRNA and Surgeon  Anesthesia Plan Comments:         Anesthesia Quick Evaluation

## 2022-02-28 NOTE — Op Note (Signed)
[facility] Patient Name: Alison [name] Procedure Date: 02/28/2022 10:49 AM MRN: [id number] Account #: [id number] Date of Birth: 14-Aug-1976 Admit Type: Outpatient Age: 46 Room: [facility] ENDO ROOM 4 Gender: Female Note Status: Finalized Instrument Name: Jasper Riling [id number] Procedure:             Colonoscopy Indications:           Screening for colorectal malignant neoplasm Providers:             [name] MD, MD Referring MD:          No Local Md, MD (Referring MD) Medicines:             Propofol per Anesthesia Complications:         No immediate complications. Procedure:             Pre-Anesthesia Assessment:                        - Prior to the procedure, a History and Physical was                         performed, and patient medications and allergies were                         reviewed. The patient's tolerance of previous                         anesthesia was also reviewed. The risks and benefits                         of the procedure and the sedation options and risks                         were discussed with the patient. All questions were                         answered, and informed consent was obtained. Prior                         Anticoagulants: The patient has taken no previous                         anticoagulant or antiplatelet agents. ASA Grade                         Assessment: II - A patient with mild systemic disease.                         After reviewing the risks and benefits, the patient                         was deemed in satisfactory condition to undergo the                         procedure.                        After obtaining informed consent, the colonoscope was  passed under direct vision. Throughout the procedure,                         the patient's blood pressure, pulse, and oxygen                         saturations were monitored continuously. The                          Colonoscope was introduced through the anus and                         advanced to the the cecum, identified by appendiceal                         orifice and ileocecal valve. The colonoscopy was                         performed without difficulty. The patient tolerated                         the procedure well. The quality of the bowel                         preparation was excellent. Findings:      The perianal and digital rectal examinations were normal.      Non-bleeding internal hemorrhoids were found during retroflexion. The       hemorrhoids were Grade I (internal hemorrhoids that do not prolapse). Impression:            - Non-bleeding internal hemorrhoids.                        - No specimens collected. Recommendation:        - Discharge patient to home.                        - Resume previous diet.                        - Continue present medications.                        - Repeat colonoscopy in 10 years for screening                         purposes. Procedure Code(s):     --- Professional ---                        [phone], Colonoscopy, flexible; diagnostic, including                         collection of specimen(s) by brushing or washing, when                         performed (separate procedure) Diagnosis Code(s):     --- Professional ---                        Z12.11, Encounter for screening for malignant neoplasm  of colon CPT copyright 2019 [employer]. All rights reserved. The codes documented in this report are preliminary and upon coder review may  be revised to meet current compliance requirements. [name] MD, MD 02/28/2022 11:12:19 AM This report has been signed electronically. Number of Addenda: 0 Note Initiated On: 02/28/2022 10:49 AM Scope Withdrawal Time: 0 hours 6 minutes 40 seconds  Total Procedure Duration: 0 hours 12 minutes 47 seconds  Estimated Blood Loss:  Estimated blood loss: none.      [facility]

## 2022-02-28 NOTE — Transfer of Care (Signed)
Immediate Anesthesia Transfer of Care Note  [name]  Procedure(s) Performed: COLONOSCOPY WITH PROPOFOL  Patient Location: PACU and Endoscopy Unit  Anesthesia Type:General  Level of Consciousness: drowsy and patient cooperative  Airway & Oxygen Therapy: Patient Spontanous Breathing  Post-op Assessment: Report given to RN and Post -op Vital signs reviewed and stable  Post vital signs: Reviewed and stable  Last Vitals:  Vitals Value Taken Time  BP 93/52 02/28/22 1115  Temp    Pulse 70 02/28/22 1115  Resp 20 02/28/22 1115  SpO2 95 % 02/28/22 1115    Last Pain:  Vitals:   02/28/22 1115  TempSrc:   PainSc: Asleep         Complications: No notable events documented.

## 2022-02-28 NOTE — H&P (Signed)
[name], MD [facility] [address] [place], [place] [address] Phone: [phone] Fax : [phone]  Primary Care Physician:  [facility] Primary Gastroenterologist:  Dr. Allen Norris  Pre-Procedure History & Physical: HPI:  Alison [name] is a 46 y.o. female is here for a screening colonoscopy.   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   Chemo tx's   Migraine    Morbid obesity (HCC)    Neuromuscular disorder (HCC)    neuropathy   Neuropathy    Personality disorder (HCC)    Pineal gland cyst    Post herpetic neuralgia    PTSD (post-traumatic stress disorder)    Renal disorder    S/P liver transplant ([place]) 1993   Sleep apnea    has not recieved cpap yet-other one was recalled    Past Surgical History:  Procedure Laterality Date   BONE MARROW BIOPSY  01/13/2015   BREAST BIOPSY  12/2014   BREAST BIOPSY  2011   BREAST SURGERY     CHOLECYSTECTOMY     COLONOSCOPY     ESOPHAGOGASTRODUODENOSCOPY     ESOPHAGOGASTRODUODENOSCOPY (EGD) WITH PROPOFOL N/A 01/09/2021   Procedure: ESOPHAGOGASTRODUODENOSCOPY (EGD) WITH PROPOFOL;  Surgeon: [name], MD;  Location: [facility] END[facility];  Service: Endoscopy;  Laterality: N/A;   HERNIA REPAIR     x4-all from liver transplant   HYSTEROSCOPY WITH D & C N/A 11/13/2021    Procedure: DILATATION AND CURETTAGE /HYSTEROSCOPY;  Surgeon: Homero Fellers, MD;  Location: [facility] ORS;  Service: Gynecology;  Laterality: N/A;   infusaport     LIVER TRANSPLANT  12/17/1991   LUMBAR PUNCTURE     PORT A CATH INJECTION ([place] [facility])     RENAL BIOPSY     tumor removal  2015   cancer right side    Prior to Admission medications   Medication Sig Start Date End Date Taking? Authorizing Provider  baclofen (LIORESAL) 10 MG tablet Take 10 mg by mouth 2 (two) times daily.    [provider]  benztropine (COGENTIN) 1 MG tablet Take 1 tablet (1 mg total) by mouth daily as needed for tremors. 11/20/20   [name], MD  cholecalciferol (VITAMIN D3) 25 MCG (1000 UNIT) tablet Take 1,000 Units by mouth in the morning and at bedtime.    [provider]  CRANBERRY PO Take 1 capsule by mouth in the morning and at bedtime.    [provider]  Dulaglutide 4.5 MG/0.5ML SOPN Inject 4.5 mg into the skin once a week. 11/28/21   [provider]  eszopiclone (LUNESTA) 2 MG TABS tablet Take 1 tablet (2 mg total) by mouth at bedtime  as needed for sleep. Take immediately before bedtime 02/14/22   [name], MD  ezetimibe (ZETIA) 10 MG tablet Take 10 mg by mouth every morning. 04/09/21   [provider]  fluticasone (FLONASE) 50 MCG/ACT nasal spray Place 1 spray into both nostrils daily as needed for allergies. 10/30/21   [provider]  icosapent Ethyl (VASCEPA) 1 g capsule Take 2 capsules (2 g total) by mouth 2 (two) times daily. 09/07/21   [name], MD  lisinopril (ZESTRIL) 20 MG tablet Take 1 tablet (20 mg total) by mouth daily. 08/29/21   [name], PA-C  lurasidone (LATUDA) 20 MG TABS tablet Take 1 tablet (20 mg total) by mouth daily with supper. 12/25/21   [name], MD  Magnesium 500 MG CAPS Take 500 mg by mouth in the morning and at bedtime.    [provider]  Melatonin 10 MG TABS Take 10 mg by mouth at  bedtime.    [provider]  metFORMIN (GLUCOPHAGE) 1000 MG tablet TAKE ONE TABLET BY MOUTH TWICE DAILY with meals 09/26/21   Bo Merino, FNP  MYRBETRIQ 50 MG TB24 tablet TAKE ONE TABLET BY MOUTH ONCE DAILY 01/22/22   Debroah Loop, PA-C  naloxone [facility]) nasal spray 4 mg/0.1 mL Place 0.4 mg into the nose once. 09/05/21   [provider]  oxyCODONE ER (XTAMPZA ER) 13.5 MG C12A Take 13.5 mg by mouth 2 (two) times daily.    [provider]  pregabalin (LYRICA) 200 MG capsule Take 1 capsule (200 mg total) by mouth 2 (two) times daily. 12/03/21 01/02/22  [name], MD  rosuvastatin (CRESTOR) 10 MG tablet Take 1 tablet (10 mg total) by mouth daily. 12/24/21   [name], MD  tacrolimus (PROGRAF) 1 MG capsule Take 1 capsule by mouth once daily 07/08/19   [name], FNP  tacrolimus (PROGRAF) 1 MG capsule Take by mouth. 02/18/22 02/18/23  [provider]  trimethoprim (TRIMPEX) 100 MG tablet Take 1 tablet (100 mg total) by mouth daily. 11/26/21   MacDiarmid, Nicki Reaper, MD  UBRELVY 50 MG TABS Take 1 tablet by mouth daily as needed. 01/19/22   [provider]  venlafaxine XR (EFFEXOR XR) 75 MG 24 hr capsule Take 1 capsule (75 mg total) by mouth daily with breakfast. 02/14/22   [name], [name], MD  XYLIMELTS 550 MG DISK Take 550 mg by mouth every 8 (eight) hours as needed (dry mouth). 07/20/21   [provider]    Allergies as of 01/28/2022 - Review Complete 01/25/2022  Allergen Reaction Noted   Penicillin g Itching, Rash, Anaphylaxis, and Palpitations 06/08/74   Lamictal [lamotrigine] Rash 12/19/2020   Carbamazepine Other (See Comments) 12/17/1991   Hydrocodone-acetaminophen Itching 05/18/2019   Naproxen Itching 05/18/2019    Family History  Problem Relation Age of Onset   Heart disease Mother    Hypertension Mother    Cancer - Other Mother    Bipolar disorder Mother    Heart disease Father    Hypertension Father     Diabetes Father    Parkinson's disease Maternal Grandmother    Cancer Maternal Aunt    Cancer Maternal Uncle    Cancer Maternal Grandfather    Lupus Paternal Grandmother    Hypertension Brother     Social History   Socioeconomic History   Marital status: Single    Spouse name: Not on file   Number of children: 0   Years of education: 9   Highest education level: GED  or equivalent  Occupational History   Occupation: disabled  Tobacco Use   Smoking status: Never   Smokeless tobacco: Never  Vaping Use   Vaping Use: Never used  Substance and Sexual Activity   Alcohol use: Not Currently   Drug use: Not Currently    Comment: pain managment last used in early april   Sexual activity: Not Currently  Other Topics Concern   Not on file  Social History Narrative   Not on file   Social Determinants of Health   Financial Resource Strain: Low Risk    Difficulty of Paying Living Expenses: Not very hard  Food Insecurity: No Food Insecurity   Worried About Charity fundraiser in the Last Year: Never true   Ran Out of Food in the Last Year: Never true  Transportation Needs: No Transportation Needs   Lack of Transportation (Medical): No   Lack of Transportation (Non-Medical): No  Physical Activity: Insufficiently Active   Days of Exercise per Week: 7 days   Minutes of Exercise per Session: 20 min  Stress: No Stress Concern Present   Feeling of Stress : Only a little  Social Connections: Moderately Integrated   Frequency of Communication with Friends and Family: More than three times a week   Frequency of Social Gatherings with Friends and Family: More than three times a week   Attends Religious Services: More than 4 times per year   Active Member of [employer] or Organizations: No   Attends Archivist Meetings: Never   Marital Status: Living with partner  Intimate Partner Violence: Not At Risk   Fear of Current or Ex-Partner: No   Emotionally Abused: No   Physically  Abused: No   Sexually Abused: No    Review of Systems: See HPI, otherwise negative ROS  Physical Exam: BP 112/84   Pulse 66   Temp (!) 96.9 F (36.1 C) (Temporal)   Resp 18   Ht 5' 6"  (1.676 m)   Wt 110.2 kg   SpO2 100%   BMI 39.22 kg/m  General:   Alert,  pleasant and cooperative in NAD Head:  Normocephalic and atraumatic. Neck:  Supple; no masses or thyromegaly. Lungs:  Clear throughout to auscultation.    Heart:  Regular rate and rhythm. Abdomen:  Soft, nontender and nondistended. Normal bowel sounds, without guarding, and without rebound.   Neurologic:  Alert and  oriented x4;  grossly normal neurologically.  Impression/Plan: DELAYNEE ALRED is now here to undergo a screening colonoscopy.  Risks, benefits, and alternatives regarding colonoscopy have been reviewed with the patient.  Questions have been answered.  All parties agreeable.

## 2022-03-01 ENCOUNTER — Encounter: Payer: Self-pay | Admitting: Gastroenterology

## 2022-03-01 NOTE — Anesthesia Postprocedure Evaluation (Signed)
Anesthesia Post Note  [name]  Procedure(s) Performed: COLONOSCOPY WITH PROPOFOL  Patient location during evaluation: PACU Anesthesia Type: General Level of consciousness: awake and oriented Pain management: satisfactory to patient Vital Signs Assessment: post-procedure vital signs reviewed and stable Respiratory status: spontaneous breathing and respiratory function stable Cardiovascular status: stable Anesthetic complications: no   No notable events documented.   Last Vitals:  Vitals:   02/28/22 1131 02/28/22 1144  BP: (!) 96/53 109/83  Pulse: 68 65  Resp: 18 14  Temp:    SpO2: 98%     Last Pain:  Vitals:   02/28/22 1131  TempSrc:   PainSc: 0-No pain                 VAN STAVEREN,Jeremian Whitby

## 2022-03-06 DIAGNOSIS — I10 Essential (primary) hypertension: Secondary | ICD-10-CM | POA: Diagnosis not present

## 2022-03-06 DIAGNOSIS — E1165 Type 2 diabetes mellitus with hyperglycemia: Secondary | ICD-10-CM | POA: Diagnosis not present

## 2022-03-06 DIAGNOSIS — E7849 Other hyperlipidemia: Secondary | ICD-10-CM | POA: Diagnosis not present

## 2022-03-06 LAB — HEMOGLOBIN A1C: Hemoglobin A1C: 6.4

## 2022-03-08 DIAGNOSIS — K7469 Other cirrhosis of liver: Secondary | ICD-10-CM | POA: Diagnosis not present

## 2022-03-08 DIAGNOSIS — Z944 Liver transplant status: Secondary | ICD-10-CM | POA: Diagnosis not present

## 2022-03-08 DIAGNOSIS — K7460 Unspecified cirrhosis of liver: Secondary | ICD-10-CM | POA: Diagnosis not present

## 2022-03-13 ENCOUNTER — Other Ambulatory Visit: Payer: Self-pay | Admitting: Pharmacist

## 2022-03-13 DIAGNOSIS — M797 Fibromyalgia: Secondary | ICD-10-CM | POA: Diagnosis not present

## 2022-03-13 DIAGNOSIS — K7581 Nonalcoholic steatohepatitis (NASH): Secondary | ICD-10-CM | POA: Diagnosis not present

## 2022-03-13 DIAGNOSIS — F3131 Bipolar disorder, current episode depressed, mild: Secondary | ICD-10-CM | POA: Diagnosis not present

## 2022-03-13 DIAGNOSIS — Z6837 Body mass index (BMI) 37.0-37.9, adult: Secondary | ICD-10-CM | POA: Diagnosis not present

## 2022-03-13 DIAGNOSIS — I10 Essential (primary) hypertension: Secondary | ICD-10-CM | POA: Diagnosis not present

## 2022-03-13 DIAGNOSIS — R52 Pain, unspecified: Secondary | ICD-10-CM | POA: Diagnosis not present

## 2022-03-13 DIAGNOSIS — F3181 Bipolar II disorder: Secondary | ICD-10-CM | POA: Diagnosis not present

## 2022-03-13 DIAGNOSIS — Z944 Liver transplant status: Secondary | ICD-10-CM | POA: Diagnosis not present

## 2022-03-13 DIAGNOSIS — E7849 Other hyperlipidemia: Secondary | ICD-10-CM | POA: Diagnosis not present

## 2022-03-13 DIAGNOSIS — E1165 Type 2 diabetes mellitus with hyperglycemia: Secondary | ICD-10-CM

## 2022-03-13 DIAGNOSIS — E669 Obesity, unspecified: Secondary | ICD-10-CM | POA: Diagnosis not present

## 2022-03-13 DIAGNOSIS — F411 Generalized anxiety disorder: Secondary | ICD-10-CM | POA: Diagnosis not present

## 2022-03-13 DIAGNOSIS — C8580 Other specified types of non-Hodgkin lymphoma, unspecified site: Secondary | ICD-10-CM | POA: Diagnosis not present

## 2022-03-13 DIAGNOSIS — F4310 Post-traumatic stress disorder, unspecified: Secondary | ICD-10-CM | POA: Diagnosis not present

## 2022-03-13 NOTE — Chronic Care Management (AMB) (Signed)
Called patient. She was in the office waiting to see Dr. Ronnald Collum. Reports they checked labs before the visit and A1c was 6.4%, LDL was 27. She was waiting to discuss results with Dr. Ronnald Collum.   Praised patient for significant improvement in labs. Will call next week for appointment   Patient is participating in a Managed Medicaid Plan:  Yes   Catie Hedwig Morton, PharmD, [facility] [phone]

## 2022-03-15 ENCOUNTER — Other Ambulatory Visit: Payer: Self-pay | Admitting: Obstetrics and Gynecology

## 2022-03-15 NOTE — Patient Outreach (Signed)
Medicaid Managed Care   Nurse Care Manager Note  03/15/2022 Name:  LEOMA FOLDS MRN:  [id number] DOB:  [birth year]  Aleene Davidson Cowens is an 46 y.o. year old female who is a primary patient of Bo Merino, FNP.  The [facility] Managed Care Coordination team was consulted for assistance with:    Chronic healthcare management needs, DM, chreonic pain, OSA, migraines, anxiety/depression/bipolar, LDD, LBP, h/o lymphoma, prior liver transplant , fibromyalgia  Ms. Bakke was given information about Medicaid Managed Care Coordination team services today. [name] Patient agreed to services and verbal consent obtained.  Engaged with patient by telephone for follow up visit in response to provider referral for case management and/or care coordination services.   Assessments/Interventions:  Review of past medical history, allergies, medications, health status, including review of consultants reports, laboratory and other test data, was performed as part of comprehensive evaluation and provision of chronic care management services.  SDOH (Social Determinants of Health) assessments and interventions performed: SDOH Interventions    Flowsheet Row Most Recent Value  SDOH Interventions   Food Insecurity Interventions Intervention Not Indicated  Transportation Interventions Intervention Not Indicated      Care Plan  Allergies  Allergen Reactions   Penicillin G Itching, Rash, Anaphylaxis and Palpitations   Lamictal [Lamotrigine] Rash   Carbamazepine Other (See Comments)    Medication interaction-prograf    Hydrocodone-Acetaminophen Itching   Naproxen Itching   Medications Reviewed Today     Reviewed by [name], RN (Registered Nurse) on 03/15/22 at 61  Med List Status: <None>   Medication Order Taking? Sig Documenting Provider Last Dose Status Informant  baclofen (LIORESAL) 10 MG tablet [id number] No Take 10 mg by mouth 2 (two) times daily. [provider] Taking  Active Self  benztropine (COGENTIN) 1 MG tablet [id number] No Take 1 tablet (1 mg total) by mouth daily as needed for tremors. [name], MD Taking Active Self  cholecalciferol (VITAMIN D3) 25 MCG (1000 UNIT) tablet [id number] No Take 1,000 Units by mouth in the morning and at bedtime. [provider] Taking Active Self  CRANBERRY PO [id number] No Take 1 capsule by mouth in the morning and at bedtime. [provider] Taking Active Self  Dulaglutide 4.5 MG/0.5ML SOPN [id number] No Inject 4.5 mg into the skin once a week. [provider] Taking Active   eszopiclone (LUNESTA) 2 MG TABS tablet [id number]  Take 1 tablet (2 mg total) by mouth at bedtime as needed for sleep. Take immediately before bedtime [name], [name], MD  Active   ezetimibe (ZETIA) 10 MG tablet [id number] No Take 10 mg by mouth every morning. [provider] Taking Active Self  fluticasone (FLONASE) 50 MCG/ACT nasal spray [id number] No Place 1 spray into both nostrils daily as needed for allergies. [provider] Taking Active Self  icosapent Ethyl (VASCEPA) 1 g capsule [id number] No Take 2 capsules (2 g total) by mouth 2 (two) times daily. [name], MD Taking Active Self  lisinopril (ZESTRIL) 20 MG tablet [id number] No Take 1 tablet (20 mg total) by mouth daily. [name], PA-C Taking Active Self  lurasidone (LATUDA) 20 MG TABS tablet [id number] No Take 1 tablet (20 mg total) by mouth daily with supper. [name], MD Taking Active   Magnesium 500 MG CAPS [id number] No Take 500 mg by mouth in the morning and at bedtime. [provider] Taking Active Self  Melatonin 10 MG TABS [id number] No Take 10 mg by mouth at bedtime. [provider] Taking Active Self  metFORMIN (GLUCOPHAGE) 1000 MG tablet [id number] No TAKE ONE TABLET BY MOUTH TWICE DAILY with meals Bo Merino, FNP Taking Active Self  MYRBETRIQ 50 MG TB24 tablet [id number] No TAKE ONE TABLET BY MOUTH  ONCE DAILY Vaillancourt, Samantha, PA-C Taking Active   naloxone (NARCAN) nasal spray 4 mg/0.1 mL [id number] No Place 0.4 mg into the nose once. [provider] Taking Active Self  oxyCODONE ER [facility]) 13.5 MG C12A [id number] No Take 13.5 mg by mouth 2 (two) times daily. [provider] Taking Active Self  pregabalin (LYRICA) 200 MG capsule [id number] No Take 1 capsule (200 mg total) by mouth 2 (two) times daily. [name], MD Taking Expired 01/02/22 2359   rosuvastatin (CRESTOR) 10 MG tablet [id number] No Take 1 tablet (10 mg total) by mouth daily. [name], MD Taking Active   tacrolimus (PROGRAF) 1 MG capsule [id number] No Take 1 capsule by mouth once daily [name], FNP Taking Active Self           Med Note [name], [name]   Fri Jan 19, 2021  5:01 AM)    tacrolimus (PROGRAF) 1 MG capsule [id number]  Take by mouth. [provider]  Active   trimethoprim (TRIMPEX) 100 MG tablet [id number] No Take 1 tablet (100 mg total) by mouth daily. [name], MD Taking Active   UBRELVY 50 MG TABS [id number] No Take 1 tablet by mouth daily as needed. [provider] Taking Active   venlafaxine XR (EFFEXOR XR) 75 MG 24 hr capsule [id number]  Take 1 capsule (75 mg total) by mouth daily with breakfast. [name], MD  Active   XYLIMELTS 550 MG DISK [id number] No Take 550 mg by mouth every 8 (eight) hours as needed (dry mouth). [provider] Taking Active Self           Patient Active Problem List   Diagnosis Date Noted   Encounter for screening colonoscopy    Menorrhagia with irregular cycle    Akathisia 10/24/2021   At risk for prolonged QT interval syndrome 09/12/2021   Prolonged Q-T interval on ECG 08/29/2021   Insomnia 07/20/2021   Bipolar disorder, in full remission, most recent episode mixed ([place]) 07/20/2021   Bipolar 1 disorder, mixed, mild ([place]) 05/09/2021   GAD (generalized anxiety disorder) 05/09/2021   Hepatic  cirrhosis ([place])    Opioid dependence with opioid-induced disorder ([place]) 12/20/2020   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   Bipolar disorder, in full remission, most recent episode depressed ([place]) 09/27/2020   Suprapubic pain 07/25/2020   Bipolar 1 disorder, depressed, mild ([place]) 07/10/2020   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   Edema of lower extremity 09/16/2019   Carpal tunnel syndrome, left 07/26/2019   Cubital tunnel syndrome on left 07/26/2019   Bipolar I disorder, most recent episode depressed ([place]) 07/12/2019   PTSD (post-traumatic stress disorder) 35/32/9924   Umbilical hernia without obstruction and without gangrene 06/17/2019   Encounter for surveillance of abnormal nevi 06/17/2019   Pineal gland cyst 06/17/2019   Chronic hip pain, left 06/02/2019   Lumbar spondylosis 06/02/2019   Mouth dryness 06/02/2019   Obesity (BMI 35.0-39.9 without comorbidity) 06/02/2019   Goals of care, counseling/discussion 05/18/2019   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[place]) 05/18/2019   Occipital neuralgia 05/13/2019   Plantar fasciitis of left foot 04/27/2019   Fibromyalgia 03/23/2019   Systemic lupus erythematosus (SLE)  in adult [facility]) 03/23/2019   Essential hypertension 03/23/2019   Hepatitis 03/23/2019   Mass of left kidney 03/23/2019   Diabetes mellitus ([place]) 03/23/2019   Nausea without vomiting 03/23/2019   Neuropathy 03/23/2019   Cancer ([place]) 03/23/2019   Hyperlipidemia 03/23/2019   Osteoarthritis 03/23/2019   Trigeminal neuralgia 03/23/2019   Chronic renal disease, stage III ([place]) 03/23/2019   Nephrolithiasis 03/23/2019   Migraine 03/23/2019   OSA on CPAP 03/23/2019   Fibrocystic breast 03/23/2019   Heart murmur 03/23/2019   Bipolar disorder, in partial remission, most recent episode depressed ([place]) 07/02/2017   Abnormal uterine  bleeding 03/18/2017   Major depressive disorder, recurrent ([place]) 03/18/2017   Morbid obesity ([place]) 03/18/2017   Secondary hyperparathyroidism ([place]) 02/07/2017   Neuropathic pain 11/19/2016   Post herpetic neuralgia 11/19/2016   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[place]) 01/13/2015   Lumbar disc disease with radiculopathy 07/26/2013   S/P liver transplant ([place]) 07/26/2013   Type 2 diabetes mellitus, uncontrolled 07/26/2013   Facial nerve disorder 06/29/2012   Low back pain 12/26/2010   Conditions to be addressed/monitored per PCP order:  Chronic healthcare management needs, DM, chreonic pain, OSA, migraines, anxiety/depression/bipolar, LDD, LBP, h/o lymphoma, prior liver transplant  Care Plan : Chronic Pain (Adult)  Updates made by [name], RN since 03/15/2022 12:00 AM     Problem: Chronic Pain Management-fibromyalgia   Priority: High  Onset Date: 01/22/2021     Long-Range Goal: Fibromyalgia pain managed-new pain management provider   Start Date: 08/14/2020  Expected End Date: 05/15/2022  Recent Progress: On track  Priority: High  Note:   Current Barriers:  Knowledge deficits related to pain management  03/15/22:  No complaints today-hip pain improved.  Saw Dr. Humphrey Rolls last month-no change in meds.  Nurse Case Manager Clinical Goal(s):  Over the next 45 days, patient will work with [facility] to address needs related to referral for pain management provider and associated care coordination needs. Update 09/22/20:  patient is currently seeing Dr. Humphrey Rolls at [facility].  Interventions:  Inter-disciplinary care team collaboration (see longitudinal plan of care) Evaluation of current treatment plan related to fibromyalgia  and patient's adherence to plan as established by provider. Update 06/15/21:  Patient taking Baclofen again, Effexor added. Collaborated with primary care provider regarding recommendations and referral to pain management provider. Discussed plans  with patient for ongoing care management follow up and provided patient with direct contact information for care management team Anticipate pain education program, pain management support as part of pain management referral.       Patient Goals/Self-Care Activities Over the next 45 days, patient will:  -Attends all scheduled provider appointments  develop a personal pain management plan with your pain management provider.  Follow Up Plan:  The Managed Medicaid care management team will reach out to the patient again over the next 30 days.   Care Plan : Wellness (Adult)  Updates made by [name], RN since 03/15/2022 12:00 AM     Problem: Medication Adherence (Wellness)   Priority: High  Onset Date: 01/22/2021     Long-Range Goal: Medication Adherence Maintained   Start Date: 09/06/2020  Expected End Date: 06/15/2022  Recent Progress: On track  Priority: High  Note:   Current Barriers:  Chronic Disease Management of chronic health conditions  03/15/22:  patient wearing CPAP every night.  A1C improved to 6.4-checks blood sugars 1-2 times a day.  Patient with no complaints today-feeling better Nurse Case  Manager Clinical Goal(s):  Over the next 30 days, patient will work with CM team pharmacist to review current medications. Update 01/22/21:  Patient met with Pharmacist and continues to follow. Over the next 30 days, patient will meet with Nutritionist and check her blood sugars. Over the next 30 days, patient will attend all scheduled appointments.  Interventions:  Inter-disciplinary care team collaboration (see longitudinal plan of care) Evaluation of current treatment plan and patient's adherence to plan as established by provider. Advised patient to contact her PCP for any medication needs. Reviewed medications with patient. Collaborated with pharmacy regarding medications.  Discussed plans with patient for ongoing care management follow up and provided patient with direct  contact information for care management team Pharmacy referral for medication review. Patient given phone number for [facility] transportation if needed. Will notify PCP of need for testing strips and dietician referral. Collaborated with PCP for cardiology appointment. Collaborated with Care Guide for dental resources. Care guide referral for dental resources.-completed Patient Goals/Self-Care Activities Over the next 30 days, patient will:  -Patient will take medications as prescribed. Calls pharmacy for medication refills Calls provider office for new concerns or questions  Follow Up Plan: The Managed Medicaid care management team will reach out to the patient again over the next 30 days.  The patient has been provided with contact information for the Managed Medicaid care management team and has been advised to call with any health related questions or concerns.    Follow Up:  Patient agrees to Care Plan and Follow-up.  Plan: The Managed Medicaid care management team will reach out to the patient again over the next 30 days. and The  Patient has been provided with contact information for the Managed Medicaid care management team and has been advised to call with any health related questions or concerns.  Date/time of next scheduled RN care management/care coordination outreach:  04/23/22 at 230.

## 2022-03-15 NOTE — Patient Instructions (Signed)
Hi Ms. Dormer, glad you are doing better, have a nice weekend!!  Ms. Shibley was given information about Medicaid Managed Care team care coordination services as a part of their Healthy [facility] Medicaid benefit. [name] verbally consented to engagement with the [facility] Managed Care team.   If you are experiencing a medical emergency, please call 911 or report to your local emergency department or urgent care.   If you have a non-emergency medical problem during routine business hours, please contact your provider's office and ask to speak with a nurse.   For questions related to your Healthy [facility] health plan, please call: [phone] or visit the homepage here: [email or web address]  If you would like to schedule transportation through your Healthy [facility] plan, please call the following number at least 2 days in advance of your appointment: [phone]  For information about your ride after you set it up, call Ride Assist at [phone]. Use this number to activate a Will Call pickup, or if your transportation is late for a scheduled pickup. Use this number, too, if you need to make a change or cancel a previously scheduled reservation.  If you need transportation services right away, call [phone]. The after-hours call center is staffed 24 hours to handle ride assistance and urgent reservation requests (including discharges) 365 days a year. Urgent trips include sick visits, hospital discharge requests and life-sustaining treatment.  Call the [place] at [phone], at any time, 24 hours a day, 7 days a week. If you are in danger or need immediate medical attention call 911.  If you would like help to quit smoking, call 1-800-QUIT-NOW (862)069-4542) OR Espaol: 1-855-Djelo-Ya (4-742-595-6387) o para ms informacin haga clic aqu or Text READY to 200-400 to register via text  Ms. Jentz - following are  the goals we discussed in your visit today:   Goals Addressed             This Visit's Progress    Chronic Pain Managed       03/15/22:  No complaints today-hip pain improved.    Get sugars at goal       Timeframe:  Short-Term Goal Priority:  High Start Date:                             Expected End Date:      ongoing                 Follow Up Date: 04/23/22   - call for medicine refill 2 or 3 days before it runs out - call if I am sick and can't take my medicine - keep a list of all the medicines I take; vitamins and herbals too    Why is this important?   These steps will help you keep on track with your medicines.   03/15/22-patient checks blood sugars 1-2 times a day, 115-220.  A1C 6.4 yesterday   Patient verbalizes understanding of instructions and care plan provided today and agrees to view in [place]. Active MyChart status and patient understanding of how to access instructions and care plan via MyChart confirmed with patient.     The Managed Medicaid care management team will reach out to the patient again over the next 30 days.  The  Patient  has been provided with contact information for the Managed Medicaid care management team and has been advised to call with any health related questions or  concerns.   [name], [name] [place] Management Coordinator - Managed Medicaid High Risk [phone]   Following is a copy of your plan of care:  Care Plan : Chronic Pain (Adult)  Updates made by [name], RN since 03/15/2022 12:00 AM     Problem: Chronic Pain Management-fibromyalgia   Priority: High  Onset Date: 01/22/2021     Long-Range Goal: Fibromyalgia pain managed-new pain management provider   Start Date: 08/14/2020  Expected End Date: 05/15/2022  Recent Progress: On track  Priority: High  Note:   Current Barriers:  Knowledge deficits related to pain management  03/15/22:  No complaints today-hip pain improved.  Saw Dr.  Humphrey Rolls last month-no change in meds.  Nurse Case Manager Clinical Goal(s):  Over the next 45 days, patient will work with [facility] to address needs related to referral for pain management provider and associated care coordination needs. Update 09/22/20:  patient is currently seeing Dr. Humphrey Rolls at [facility].  Interventions:  Inter-disciplinary care team collaboration (see longitudinal plan of care) Evaluation of current treatment plan related to fibromyalgia  and patient's adherence to plan as established by provider. Update 06/15/21:  Patient taking Baclofen again, Effexor added. Collaborated with primary care provider regarding recommendations and referral to pain management provider. Discussed plans with patient for ongoing care management follow up and provided patient with direct contact information for care management team Anticipate pain education program, pain management support as part of pain management referral.       Patient Goals/Self-Care Activities Over the next 45 days, patient will:  -Attends all scheduled provider appointments  develop a personal pain management plan with your pain management provider.  Follow Up Plan:  The Managed Medicaid care management team will reach out to the patient again over the next 30 days.   Care Plan : Wellness (Adult)  Updates made by [name], RN since 03/15/2022 12:00 AM     Problem: Medication Adherence (Wellness)   Priority: High  Onset Date: 01/22/2021     Long-Range Goal: Medication Adherence Maintained   Start Date: 09/06/2020  Expected End Date: 06/15/2022  Recent Progress: On track  Priority: High  Note:   Current Barriers:  Chronic Disease Management of chronic health conditions  03/15/22:  patient wearing CPAP every night.  A1C improved to 6.4-checks blood sugars 1-2 times a day.  Patient with no complaints today-feeling better Nurse Case Manager Clinical Goal(s):  Over the next 30 days, patient will work  with CM team pharmacist to review current medications. Update 01/22/21:  Patient met with Pharmacist and continues to follow. Over the next 30 days, patient will meet with Nutritionist and check her blood sugars. Over the next 30 days, patient will attend all scheduled appointments.  Interventions:  Inter-disciplinary care team collaboration (see longitudinal plan of care) Evaluation of current treatment plan and patient's adherence to plan as established by provider. Advised patient to contact her PCP for any medication needs. Reviewed medications with patient. Collaborated with pharmacy regarding medications.  Discussed plans with patient for ongoing care management follow up and provided patient with direct contact information for care management team Pharmacy referral for medication review. Patient given phone number for [facility] transportation if needed. Will notify PCP of need for testing strips and dietician referral. Collaborated with PCP for cardiology appointment. Collaborated with Care Guide for dental resources. Care guide referral for dental resources.-completed Patient Goals/Self-Care Activities Over the  next 30 days, patient will:  -Patient will take medications as prescribed. Calls pharmacy for medication refills Calls provider office for new concerns or questions  Follow Up Plan: The Managed Medicaid care management team will reach out to the patient again over the next 30 days.  The patient has been provided with contact information for the Managed Medicaid care management team and has been advised to call with any health related questions or concerns.

## 2022-03-18 ENCOUNTER — Telehealth (INDEPENDENT_AMBULATORY_CARE_PROVIDER_SITE_OTHER): Payer: Medicaid Other | Admitting: Psychiatry

## 2022-03-18 ENCOUNTER — Encounter: Payer: Self-pay | Admitting: Psychiatry

## 2022-03-18 DIAGNOSIS — F411 Generalized anxiety disorder: Secondary | ICD-10-CM | POA: Diagnosis not present

## 2022-03-18 DIAGNOSIS — Z9189 Other specified personal risk factors, not elsewhere classified: Secondary | ICD-10-CM

## 2022-03-18 DIAGNOSIS — G894 Chronic pain syndrome: Secondary | ICD-10-CM | POA: Diagnosis not present

## 2022-03-18 DIAGNOSIS — F4310 Post-traumatic stress disorder, unspecified: Secondary | ICD-10-CM | POA: Diagnosis not present

## 2022-03-18 DIAGNOSIS — Z79899 Other long term (current) drug therapy: Secondary | ICD-10-CM | POA: Diagnosis not present

## 2022-03-18 DIAGNOSIS — F3178 Bipolar disorder, in full remission, most recent episode mixed: Secondary | ICD-10-CM

## 2022-03-18 DIAGNOSIS — G4701 Insomnia due to medical condition: Secondary | ICD-10-CM

## 2022-03-18 MED ORDER — BENZTROPINE MESYLATE 1 MG PO TABS: 1.0000 mg | ORAL_TABLET | Freq: Every day | ORAL | 0 refills | Status: DC | PRN

## 2022-03-18 MED ORDER — LURASIDONE HCL 20 MG PO TABS: 20.0000 mg | ORAL_TABLET | Freq: Every day | ORAL | 0 refills | Status: DC

## 2022-03-18 NOTE — Progress Notes (Signed)
Virtual Visit via Video Note  I connected with Alison [name] on 03/18/22 at  2:00 PM EDT by a video enabled telemedicine application and verified that I am speaking with the correct person using two identifiers.  Location Provider Location : [facility] Patient Location : Car  Participants: Patient , Provider   I discussed the limitations of evaluation and management by telemedicine and the availability of in person appointments. The patient expressed understanding and agreed to proceed.   I discussed the assessment and treatment plan with the patient. The patient was provided an opportunity to ask questions and all were answered. The patient agreed with the plan and demonstrated an understanding of the instructions.   The patient was advised to call back or seek an in-person evaluation if the symptoms worsen or if the condition fails to improve as anticipated.   [place] MD OP Progress Note  03/18/2022 2:21 PM Alison [name]  MRN:  [id number]  Chief Complaint:  Chief Complaint  Patient presents with   Follow-up: 46 year old Caucasian female who has a history of bipolar disorder, PTSD, sleep problem, multiple medical problems, presented for medication management.   HPI: Alison [name] is a 46 year old Caucasian female, lives in [place], has a history of bipolar disorder, PTSD, multiple medical problems including fibromyalgia, chronic pain, trigeminal neuralgia, history of liver transplant, B-cell lymphoma, SLE, diabetes mellitus, OSA, chronic migraine headaches, hyperlipidemia, hypertension was evaluated by telemedicine today.  Patient today reports since being on the venlafaxine and the lower dosage of Latuda she has been doing well.  Denies any significant depression symptoms.  Denies any significant anxiety symptoms.  She reports she was able to make a new friend and they have been doing a lot of outdoor activities.  That has helped a lot.  Patient reports sleep is overall good.   Patient reports she was able to get the right CPAP mask and that has helped.  Reports appetite is fair.  Denies any suicidality, homicidality or perceptual disturbances.  Patient denies any other concerns today.    Visit Diagnosis:    ICD-10-CM   1. Bipolar disorder, in full remission, most recent episode mixed (HCC)  F31.78 lurasidone (LATUDA) 20 MG TABS tablet    2. PTSD (post-traumatic stress disorder)  F43.10     3. GAD (generalized anxiety disorder)  F41.1     4. Insomnia due to medical condition  G47.01    OSA, CPAP mask problem    5. At risk for prolonged QT interval syndrome  Z91.89 benztropine (COGENTIN) 1 MG tablet      Past Psychiatric History: Reviewed past psychiatric history from progress note on 06/10/2019.  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   Chemo tx's   Migraine    Morbid obesity (HCC)    Neuromuscular disorder (HCC)    neuropathy   Neuropathy    Personality disorder (HCC)    Pineal gland cyst    Post herpetic neuralgia    PTSD (post-traumatic stress disorder)    Renal disorder    S/P liver transplant ([place]) 1993   Sleep apnea    has not recieved cpap yet-other one was recalled    Past Surgical History:  Procedure Laterality Date   BONE MARROW BIOPSY  01/13/2015    BREAST BIOPSY  12/2014   BREAST BIOPSY  2011   BREAST SURGERY     CHOLECYSTECTOMY     COLONOSCOPY     COLONOSCOPY WITH PROPOFOL N/A 02/28/2022   Procedure: COLONOSCOPY WITH PROPOFOL;  Surgeon: [name], MD;  Location: [facility] ENDOSCOPY;  Service: Endoscopy;  Laterality: N/A;   ESOPHAGOGASTRODUODENOSCOPY     ESOPHAGOGASTRODUODENOSCOPY (EGD) WITH PROPOFOL N/A 01/09/2021   Procedure: ESOPHAGOGASTRODUODENOSCOPY (EGD) WITH PROPOFOL;  Surgeon: [name], MD;  Location: [facility] ENDOSCOPY;  Service: Endoscopy;  Laterality: N/A;   HERNIA REPAIR     x4-all from liver transplant   HYSTEROSCOPY WITH D & C N/A 11/13/2021   Procedure: DILATATION AND CURETTAGE /HYSTEROSCOPY;  Surgeon: Homero Fellers, MD;  Location: [facility] ORS;  Service: Gynecology;  Laterality: N/A;   infusaport     LIVER TRANSPLANT  12/17/1991   LUMBAR PUNCTURE     PORT A CATH INJECTION ([place] HX)     RENAL BIOPSY     tumor removal  2015   cancer right side    Family Psychiatric History: Reviewed family psychiatric history from progress note on 06/10/2019.  Family History:  Family History  Problem Relation Age of Onset   Heart disease Mother    Hypertension Mother    Cancer - Other Mother    Bipolar disorder Mother    Heart disease Father    Hypertension Father    Diabetes Father    Parkinson's disease Maternal Grandmother    Cancer Maternal Aunt    Cancer Maternal Uncle    Cancer Maternal Grandfather    Lupus Paternal Grandmother    Hypertension Brother     Social History: Reviewed social history from progress note on 06/10/2019. Social History   Socioeconomic History   Marital status: Single    Spouse name: Not on file   Number of children: 0   Years of education: 9   Highest education level: GED or equivalent  Occupational History   Occupation: disabled  Tobacco Use   Smoking status: Never   Smokeless tobacco: Never  Vaping Use   Vaping Use: Never used  Substance and Sexual Activity   Alcohol use: Not  Currently   Drug use: Not Currently    Comment: pain managment last used in early april   Sexual activity: Not Currently  Other Topics Concern   Not on file  Social History Narrative   Not on file   Social Determinants of Health   Financial Resource Strain: Low Risk  (01/07/2022)   Overall Financial Resource Strain (CARDIA)    Difficulty of Paying Living Expenses: Not very hard  Food Insecurity: No Food Insecurity (03/15/2022)   Hunger Vital Sign    Worried About Running Out of Food in the Last Year: Never true    Ran Out of Food in the Last Year: Never true  Transportation Needs: No Transportation Needs (03/15/2022)  PRAPARE - Hydrologist (Medical): No    Lack of Transportation (Non-Medical): No  Physical Activity: Insufficiently Active (01/07/2022)   Exercise Vital Sign    Days of Exercise per Week: 7 days    Minutes of Exercise per Session: 20 min  Stress: No Stress Concern Present (01/08/2022)   [place]    Feeling of Stress : Only a little  Social Connections: Moderately Integrated (02/12/2022)   Social Connection and Isolation Panel [NHANES]    Frequency of Communication with Friends and Family: More than three times a week    Frequency of Social Gatherings with Friends and Family: More than three times a week    Attends Religious Services: More than 4 times per year    Active Member of [employer] or Organizations: No    Attends Archivist Meetings: Never    Marital Status: Living with partner    Allergies:  Allergies  Allergen Reactions   Penicillin G Itching, Rash, Anaphylaxis and Palpitations   Lamictal [Lamotrigine] Rash   Carbamazepine Other (See Comments)    Medication interaction-prograf    Hydrocodone-Acetaminophen Itching   Naproxen Itching    Metabolic Disorder Labs: Lab Results  Component Value Date   HGBA1C 8.9 (A) 12/11/2020   MPG 209 06/05/2020   Lab  Results  Component Value Date   PROLACTIN 13.4 07/15/2019   PROLACTIN 21.3 05/24/2019   Lab Results  Component Value Date   CHOL 173 12/03/2021   TRIG 195 (H) 12/03/2021   HDL 38 (L) 12/03/2021   CHOLHDL 4.6 (H) 12/03/2021   LDLCALC 101 (H) 12/03/2021   LDLCALC 119 (H) 09/07/2021   Lab Results  Component Value Date   TSH 2.650 07/15/2019   TSH 4.650 (H) 05/24/2019    Therapeutic Level Labs: No results found for: "LITHIUM" No results found for: "VALPROATE" No results found for: "CBMZ"  Current Medications: Current Outpatient Medications  Medication Sig Dispense Refill   baclofen (LIORESAL) 10 MG tablet Take 10 mg by mouth 2 (two) times daily.     benztropine (COGENTIN) 1 MG tablet Take 1 tablet (1 mg total) by mouth daily as needed for tremors. 90 tablet 0   cholecalciferol (VITAMIN D3) 25 MCG (1000 UNIT) tablet Take 1,000 Units by mouth in the morning and at bedtime.     CRANBERRY PO Take 1 capsule by mouth in the morning and at bedtime.     Dulaglutide 4.5 MG/0.5ML SOPN Inject 4.5 mg into the skin once a week.     eszopiclone (LUNESTA) 2 MG TABS tablet Take 1 tablet (2 mg total) by mouth at bedtime as needed for sleep. Take immediately before bedtime 30 tablet 1   ezetimibe (ZETIA) 10 MG tablet Take 10 mg by mouth every morning.     fluticasone (FLONASE) 50 MCG/ACT nasal spray Place 1 spray into both nostrils daily as needed for allergies.     icosapent Ethyl (VASCEPA) 1 g capsule Take 2 capsules (2 g total) by mouth 2 (two) times daily. 120 capsule 5   lisinopril (ZESTRIL) 20 MG tablet Take 1 tablet (20 mg total) by mouth daily. 90 tablet 3   lurasidone (LATUDA) 20 MG TABS tablet Take 1 tablet (20 mg total) by mouth daily with supper. 90 tablet 0   Magnesium 500 MG CAPS Take 500 mg by mouth in the morning and at bedtime.     Melatonin 10 MG TABS Take 10 mg by  mouth at bedtime.     metFORMIN (GLUCOPHAGE) 1000 MG tablet TAKE ONE TABLET BY MOUTH TWICE DAILY with meals 180  tablet 2   MYRBETRIQ 50 MG TB24 tablet TAKE ONE TABLET BY MOUTH ONCE DAILY 90 tablet 3   Na Sulfate-K Sulfate-Mg Sulf 17.5-3.13-1.6 GM/177ML SOLN SMARTSIG:1 Kit(s) By Mouth Once     naloxone (NARCAN) nasal spray 4 mg/0.1 mL Place 0.4 mg into the nose once.     oxyCODONE ER (XTAMPZA ER) 13.5 MG C12A Take 13.5 mg by mouth 2 (two) times daily.     pregabalin (LYRICA) 200 MG capsule Take 1 capsule (200 mg total) by mouth 2 (two) times daily. 60 capsule 0   rosuvastatin (CRESTOR) 10 MG tablet Take 1 tablet (10 mg total) by mouth daily. 90 tablet 3   tacrolimus (PROGRAF) 1 MG capsule Take 1 capsule by mouth once daily 30 capsule 0   tacrolimus (PROGRAF) 1 MG capsule Take by mouth.     trimethoprim (TRIMPEX) 100 MG tablet Take 1 tablet (100 mg total) by mouth daily. 30 tablet 11   UBRELVY 50 MG TABS Take 1 tablet by mouth daily as needed.     venlafaxine XR (EFFEXOR XR) 75 MG 24 hr capsule Take 1 capsule (75 mg total) by mouth daily with breakfast. 90 capsule 0   XYLIMELTS 550 MG DISK Take 550 mg by mouth every 8 (eight) hours as needed (dry mouth).     No current facility-administered medications for this visit.     Musculoskeletal: Strength & Muscle Tone:  UTA Gait & Station:  UTA Patient leans: N/A  Psychiatric Specialty Exam: Review of Systems  Psychiatric/Behavioral: Negative.    All other systems reviewed and are negative.   There were no vitals taken for this visit.There is no height or weight on file to calculate BMI.  General Appearance: Casual  Eye Contact:  Fair  Speech:  Clear and Coherent  Volume:  Normal  Mood:  Euthymic  Affect:  Congruent  Thought Process:  Goal Directed and Descriptions of Associations: Intact  Orientation:  Full (Time, Place, and Person)  Thought Content: Logical   Suicidal Thoughts:  No  Homicidal Thoughts:  No  Memory:  Immediate;   Fair Recent;   Fair Remote;   Fair  Judgement:  Fair  Insight:  Fair  Psychomotor Activity:  Normal   Concentration:  Concentration: Fair and Attention Span: Fair  Recall:  [employer] of Knowledge: Fair  Language: Fair  Akathisia:  No  Handed:  Right  AIMS (if indicated): done  Assets:  Communication Skills Housing Social Support Talents/Skills  ADL's:  Intact  Cognition: WNL  Sleep:  Fair   Screenings: AIMS    Flowsheet Row Video Visit from 03/18/2022 in [place] Video Visit from 02/14/2022 in [place] Office Visit from 01/21/2022 in [place] Video Visit from 05/09/2021 in [place] Total Score 0 0 0 0      GAD-7    Flowsheet Row Video Visit from 03/18/2022 in [place] Video Visit from 02/14/2022 in [place] Office Visit from 01/25/2022 in [facility] Video Visit from 10/24/2021 in [place] Office Visit from 10/19/2021 in [facility]  Total GAD-7 Score _0 PHQ2-9    Flowsheet Row Video Visit from 03/18/2022 in [place] Video Visit from  02/14/2022 in [place] Office Visit from 01/25/2022 in [facility] Office Visit from 01/21/2022 in [place] Office Visit from 11/05/2021 in [place]  PHQ-2 Total Score 0 _0 PHQ-9 Total Score -- -- _1 Flowsheet Row Admission (Discharged) from 02/28/2022 in [facility] [facility] Video Visit from 02/14/2022 in [place] Office Visit from 01/21/2022 in [place] No Risk No Risk No Risk        Assessment and Plan: Alison [name] is a 46 year old Caucasian female on disability, has a history of bipolar disorder, PTSD,  history of borderline personality disorder, chronic pain, fibromyalgia, SLE, diabetes, OSA, migraine headache was evaluated by telemedicine today.  Patient is currently stable.  Plan Bipolar disorder type I mixed in remission Latuda 20 mg p.o. daily with supper Cogentin 1 mg p.o. daily as needed for side effects.  GAD-stable Venlafaxine extended release 75 mg p.o. daily. Patient reports cardiology cleared her for the same.   PTSD-stable Venlafaxine as prescribed.  Insomnia-improving Lunesta 2 mg p.o. nightly Reviewed and CPM. Continue CPAP use.  At risk for prolonged QT syndrome-improving Patient to continue to follow-up with cardiology.  Follow-up in clinic in 2 months or sooner if needed.   Consent: Patient/Guardian gives verbal consent for treatment and assignment of benefits for services provided during this visit. Patient/Guardian expressed understanding and agreed to proceed.   This note was generated in part or whole with voice recognition software. Voice recognition is usually quite accurate but there are transcription errors that can and very often do occur. I apologize for any typographical errors that were not detected and corrected.      [name], MD 03/19/2022, 8:59 AM

## 2022-03-19 ENCOUNTER — Telehealth: Payer: Self-pay | Admitting: Psychiatry

## 2022-03-19 ENCOUNTER — Other Ambulatory Visit: Payer: Self-pay | Admitting: Pharmacist

## 2022-03-19 VITALS — BP 116/78

## 2022-03-19 DIAGNOSIS — E1140 Type 2 diabetes mellitus with diabetic neuropathy, unspecified: Secondary | ICD-10-CM

## 2022-03-19 NOTE — Chronic Care Management (AMB) (Signed)
Chief Complaint  Patient presents with   Diabetes   Hypertension   Hyperlipidemia    Alison [name] is a 46 y.o. year old female who presented for a telephone visit.   They were referred to the pharmacist by their High Risk Managed Medicaid Care Team  for assistance in managing diabetes, hypertension, and hyperlipidemia.   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 [place] [address] Phone: [phone] Fax: [phone]  [facility] [address] [place] [address] Phone: [phone] Fax: [phone]   Patient reports affordability concerns with their medications: No  Patient reports access/transportation concerns to their pharmacy: No  Patient reports adherence concerns with their medications:  No    116/78  Diabetes:  Current medications: Trulicity 4.5 mg weekly, metformin 1000 mg twice daily  Reports last A1c at endocrinology was 6.4%  Hypertension:  Current medications: lisinopril 20 mg daily,   Patient has a validated, automated, upper arm home BP cuff Current blood pressure readings readings: reports a reading yesterday of 116/78   Hyperlipidemia/ASCVD Risk Reduction  Current lipid lowering medications: rosuvastatin 10 mg daily, Vascepa 1 g BID (prescribed 2 g BID by cardiology but endocrinology script for  1 g BID is being filled by the pharmacy); ezetimibe 10 mg daily  Reports improvement in lipids on endo lab work: LDL 27,   Bipolar Disorder and Insomnia, chronic pain:  Current medications: Latuda 20 mg daily, venlafaxine 75 mg daily; pregabalin 200 mg twice daily, Xtampza 13.5 mg BID (reports they plan to reduce dose at next appointment); eszopiclone 2 mg QPM, melatonin QPM; prescribed benztropine for tremors, but has not received that from the pharmacy yet Medications tried in the past:    Behavioral Health support: continues to follow with psychiatry.    Health Maintenance  There are no preventive care reminders to display for this patient.   Objective: Lab Results  Component Value Date   HGBA1C 8.9 (A) 12/11/2020    Lab Results  Component Value Date   CREATININE 0.85 12/03/2021   BUN 18 12/03/2021   NA 138 12/03/2021   K 4.3 12/03/2021   CL 103 12/03/2021   CO2 23 12/03/2021    Lab Results  Component Value Date   CHOL 173 12/03/2021   HDL 38 (L) 12/03/2021   LDLCALC 101 (H) 12/03/2021   TRIG 195 (H) 12/03/2021   CHOLHDL 4.6 (H) 12/03/2021    Medications Reviewed Today     Reviewed by Osker Mason, RPH-CPP (Pharmacist) on 03/19/22 at 1029  Med List Status: <None>   Medication Order Taking? Sig Documenting Provider Last Dose Status Informant  baclofen (LIORESAL) 10 MG tablet [id number] Yes Take 10 mg by mouth 2 (two) times daily. [provider] Taking Active Self  benztropine (COGENTIN) 1 MG tablet [id number] No Take 1 tablet (1 mg total) by mouth daily as needed for tremors.  Patient not taking: Reported on 03/19/2022   [name], MD  Not Taking Active   Cholecalciferol (VITAMIN D) 125 MCG (5000 UT) CAPS [id number] Yes Take 5,000 Units by mouth daily. [provider] Taking Active Self  CRANBERRY PO [id number] Yes Take 1 capsule by mouth in the morning and at bedtime. [provider] Taking Active Self  Dulaglutide 4.5  MG/0.5ML SOPN [id number] Yes Inject 4.5 mg into the skin once a week. [provider] Taking Active   eszopiclone (LUNESTA) 2 MG TABS tablet [id number] Yes Take 1 tablet (2 mg total) by mouth at bedtime as needed for sleep. Take immediately before bedtime [name], MD Taking Active   ezetimibe (ZETIA) 10 MG tablet [id number] Yes Take 10 mg by mouth every morning. [provider] Taking Active Self  fluticasone (FLONASE) 50 MCG/ACT nasal spray [id number] No Place 1 spray into both nostrils daily as needed for allergies.  Patient not taking: Reported on 03/19/2022   [provider] Not Taking Active Self  icosapent Ethyl (VASCEPA) 1 g capsule [id number] Yes Take 2 capsules (2 g total) by mouth 2 (two) times daily. [name], MD Taking Active Self           Med Note Jodi Mourning, Keyler Hoge T   Tue Mar 19, 2022 10:24 AM) Taking 1 capsule twice daily per Dr. Ronnald Collum  lisinopril (ZESTRIL) 20 MG tablet [id number] Yes Take 1 tablet (20 mg total) by mouth daily. [name], PA-C Taking Active Self  lurasidone (LATUDA) 20 MG TABS tablet [id number] Yes Take 1 tablet (20 mg total) by mouth daily with supper. [name], MD Taking Active   Magnesium 500 MG CAPS [id number] Yes Take 500 mg by mouth daily. [provider] Taking Active Self  Melatonin 10 MG TABS [id number] Yes Take 10 mg by mouth at bedtime. [provider] Taking Active Self  metFORMIN (GLUCOPHAGE) 1000 MG tablet [id number] Yes TAKE ONE TABLET BY MOUTH TWICE DAILY with meals Bo Merino, FNP Taking Active Self  MYRBETRIQ 50 MG TB24 tablet [id number] Yes TAKE ONE TABLET BY MOUTH ONCE DAILY Vaillancourt, Samantha, PA-C Taking Active   naloxone [facility]) nasal spray 4 mg/0.1 mL [id number] No Place 0.4 mg into the nose once.  Patient not taking: Reported on 03/19/2022   [provider] Not Taking Active Self  oxyCODONE ER [facility]) 13.5 MG C12A [id number] Yes Take 13.5 mg by mouth 2  (two) times daily. [provider] Taking Active Self  pregabalin (LYRICA) 200 MG capsule [id number] Yes Take 1 capsule (200 mg total) by mouth 2 (two) times daily. [name], MD Taking Active   rosuvastatin (CRESTOR) 10 MG tablet [id number] Yes Take 1 tablet (10 mg total) by mouth daily. [name], MD Taking Active   tacrolimus (PROGRAF) 1 MG capsule [id number] Yes Take by mouth. [provider] Taking Active   trimethoprim (TRIMPEX) 100 MG tablet [id number] No Take 1 tablet (100 mg total) by mouth daily.  Patient not taking: Reported on 03/19/2022   [name], MD Not Taking Active   UBRELVY 50 MG TABS [id number] Yes Take 1 tablet by mouth daily as needed. [provider] Taking Active   venlafaxine XR (EFFEXOR XR) 75 MG 24 hr capsule [id number] Yes Take 1 capsule (75 mg total) by mouth daily with breakfast. [name], MD Taking Active   XYLIMELTS 550 MG DISK [id number] Yes Take 550 mg by mouth every 8 (eight) hours as needed (dry mouth). [provider] Taking Active Self  Assessment/Plan:   Diabetes: - Currently controlled - Praised for achievement in glycemic control.  - Recommend to continue current regimen at this time.    Hypertension: - Currently controlled - Reviewed appropriate blood pressure monitoring technique and reviewed goal blood pressure. Recommended to check home blood pressure and heart rate periodically - Recommend to continue current regimen at this time   Hyperlipidemia/ASCVD Risk Reduction: - Currently uncontrolled.  - Discussed that CV benefit with Vascepa was seen at 2 g twice daily dose. Encouraged to discuss with Dr. Garen Lah when she sees him if he would advise her to increase to 2 g twice daily or remain on current dose. Encouraged to pass along endocrinology lipid panel to Dr. Garen Lah.  - Recommend to continue current regimen at this time    Depression/Anxiety, Insomnia,  Chronic Pain - Currently controlled - Reviewed behavioral health support strategies including continued collaboration with psychiatry and pain management.  - Reviewed availability of naloxone - Recommend to continue current regimen at this time  Health Maintenance - Discussed CDC/ACIP recommendations for Tdap vaccine(s). Recommended to pursue. Continue to evaluate for recommended vaccinations given transplant status  Follow Up Plan: goals of pharmacy involvement met. Follow up with RN CM as scheduled  Catie Hedwig Morton, PharmD, [facility] [phone]

## 2022-03-19 NOTE — Patient Instructions (Signed)
Reinette,   It was great talking with you! Keep up the fantastic work. Please feel free to reach out with any future questions or concerns.   Take care,   Catie Hedwig Morton, PharmD, [facility] [phone]

## 2022-03-19 NOTE — Telephone Encounter (Signed)
Patient wanted to clarify whether she needs an EKG since she has long QT syndrome.  Patient provided education-discussed that since she has a risk of long QT-she needs to follow up with her cardiology.

## 2022-03-22 ENCOUNTER — Other Ambulatory Visit: Payer: Self-pay | Admitting: Psychiatry

## 2022-03-22 DIAGNOSIS — F4310 Post-traumatic stress disorder, unspecified: Secondary | ICD-10-CM

## 2022-03-22 DIAGNOSIS — F3161 Bipolar disorder, current episode mixed, mild: Secondary | ICD-10-CM

## 2022-03-26 ENCOUNTER — Telehealth: Payer: Self-pay

## 2022-03-26 DIAGNOSIS — M797 Fibromyalgia: Secondary | ICD-10-CM | POA: Diagnosis not present

## 2022-03-26 NOTE — Telephone Encounter (Signed)
received fax requesting a prior auth on the latuda

## 2022-03-26 NOTE — Telephone Encounter (Signed)
went online and submitted the prior auth for the latuda.  appr5oved until 03-26-2023.  PA # [id number]

## 2022-03-27 ENCOUNTER — Encounter: Payer: Self-pay | Admitting: Nurse Practitioner

## 2022-03-27 DIAGNOSIS — G4733 Obstructive sleep apnea (adult) (pediatric): Secondary | ICD-10-CM | POA: Diagnosis not present

## 2022-04-01 DIAGNOSIS — F411 Generalized anxiety disorder: Secondary | ICD-10-CM | POA: Diagnosis not present

## 2022-04-01 DIAGNOSIS — F3181 Bipolar II disorder: Secondary | ICD-10-CM | POA: Diagnosis not present

## 2022-04-01 DIAGNOSIS — F4310 Post-traumatic stress disorder, unspecified: Secondary | ICD-10-CM | POA: Diagnosis not present

## 2022-04-10 ENCOUNTER — Other Ambulatory Visit: Payer: Self-pay | Admitting: Psychiatry

## 2022-04-10 DIAGNOSIS — F4310 Post-traumatic stress disorder, unspecified: Secondary | ICD-10-CM

## 2022-04-10 DIAGNOSIS — G4701 Insomnia due to medical condition: Secondary | ICD-10-CM

## 2022-04-10 DIAGNOSIS — F411 Generalized anxiety disorder: Secondary | ICD-10-CM

## 2022-04-15 DIAGNOSIS — Z79899 Other long term (current) drug therapy: Secondary | ICD-10-CM | POA: Diagnosis not present

## 2022-04-15 DIAGNOSIS — G894 Chronic pain syndrome: Secondary | ICD-10-CM | POA: Diagnosis not present

## 2022-04-23 ENCOUNTER — Other Ambulatory Visit: Payer: Self-pay | Admitting: Obstetrics and Gynecology

## 2022-04-23 NOTE — Patient Instructions (Signed)
Hi Alison [name], I'm glad you are having a good day, have a nice rest of the afternoon!  Alison [name] was given information about Medicaid Managed Care team care coordination services as a part of their Healthy [facility] Medicaid benefit. Alison [name] verbally consented to engagement with the [facility] Managed Care team.   If you are experiencing a medical emergency, please call 911 or report to your local emergency department or urgent care.   If you have a non-emergency medical problem during routine business hours, please contact your provider's office and ask to speak with a nurse.   For questions related to your Healthy [facility] health plan, please call: [phone] or visit the homepage here: [email or web address]  If you would like to schedule transportation through your Healthy [facility] plan, please call the following number at least 2 days in advance of your appointment: [phone]  For information about your ride after you set it up, call Ride Assist at [phone]. Use this number to activate a Will Call pickup, or if your transportation is late for a scheduled pickup. Use this number, too, if you need to make a change or cancel a previously scheduled reservation.  If you need transportation services right away, call [phone]. The after-hours call center is staffed 24 hours to handle ride assistance and urgent reservation requests (including discharges) 365 days a year. Urgent trips include sick visits, hospital discharge requests and life-sustaining treatment.  Call the [facility] at [phone], at any time, 24 hours a day, 7 days a week. If you are in danger or need immediate medical attention call 911.  If you would like help to quit smoking, call 1-800-QUIT-NOW 3204787089) OR Espaol: 1-855-Djelo-Ya (8-811-031-5945) o para ms informacin haga clic aqu or Text READY to 200-400 to register via text  Ms.  [name] - following are the goals we discussed in your visit today:   Goals Addressed             This Visit's Progress    Chronic Pain Managed       04/23/22: pain managed-some facial  pain right now.    Get sugars at goal       Timeframe:  Short-Term Goal Priority:  High Start Date:                             Expected End Date:      ongoing                 Follow Up Date: 05/24/22   - call for medicine refill 2 or 3 days before it runs out - call if I am sick and can't take my medicine - keep a list of all the medicines I take; vitamins and herbals too    Why is this important?   These steps will help you keep on track with your medicines.   04/23/22:  Blood sugars 140's, checks 1-2 times a day   Patient verbalizes understanding of instructions and care plan provided today and agrees to view in [place]. Active MyChart status and patient understanding of how to access instructions and care plan via MyChart confirmed with patient.     The Managed Medicaid care management team will reach out to the patient again over the next 30 days.  The  Patient has been provided with contact information for the Managed Medicaid care management team and has been advised to call with any health  related questions or concerns.   [name], [name] [place] Management Coordinator - Managed Medicaid High Risk [phone]   Following is a copy of your plan of care:  Care Plan : Chronic Pain (Adult)  Updates made by [name], RN since 04/23/2022 12:00 AM     Problem: Chronic Pain Management-fibromyalgia   Priority: High  Onset Date: 01/22/2021     Long-Range Goal: Fibromyalgia pain managed-new pain management provider   Start Date: 08/14/2020  Expected End Date: 07/24/2022  Recent Progress: On track  Priority: High  Note:   Current Barriers:  Knowledge deficits related to pain management  04/23/22:  Patient states pain is managed overall-some  facial pain-meds adjusted.  Nurse Case Manager Clinical Goal(s):  Over the next 45 days, patient will work with [facility] to address needs related to referral for pain management provider and associated care coordination needs. Update 09/22/20:  patient is currently seeing Dr. Humphrey Rolls at [facility].  Interventions:  Inter-disciplinary care team collaboration (see longitudinal plan of care) Evaluation of current treatment plan related to fibromyalgia  and patient's adherence to plan as established by provider. Update 06/15/21:  Patient taking Baclofen again, Effexor added. Collaborated with primary care provider regarding recommendations and referral to pain management provider. Discussed plans with patient for ongoing care management follow up and provided patient with direct contact information for care management team Anticipate pain education program, pain management support as part of pain management referral.       Patient Goals/Self-Care Activities Over the next 45 days, patient will:  -Attends all scheduled provider appointments  develop a personal pain management plan with your pain management provider.  Follow Up Plan:  The Managed Medicaid care management team will reach out to the patient again over the next 30 days.   Care Plan : Wellness (Adult)  Updates made by [name], RN since 04/23/2022 12:00 AM     Problem: Medication Adherence (Wellness)   Priority: High  Onset Date: 01/22/2021     Long-Range Goal: Medication Adherence Maintained   Start Date: 09/06/2020  Expected End Date: 06/15/2022  Recent Progress: On track  Priority: High  Note:   Current Barriers:  Chronic Disease Management of chronic health conditions  04/23/22:  patient without complaint today-continues to lose weight, 246-239.  Blood sugars 140's.  Some muscle aches and facila pain-meds adjusted. Nurse Case Manager Clinical Goal(s):  Over the next 30 days, patient will work with CM  team pharmacist to review current medications. Update 01/22/21:  Patient met with Pharmacist and continues to follow. Over the next 30 days, patient will meet with Nutritionist and check her blood sugars. Over the next 30 days, patient will attend all scheduled appointments.  Interventions:  Inter-disciplinary care team collaboration (see longitudinal plan of care) Evaluation of current treatment plan and patient's adherence to plan as established by provider. Advised patient to contact her PCP for any medication needs. Reviewed medications with patient. Collaborated with pharmacy regarding medications.  Discussed plans with patient for ongoing care management follow up and provided patient with direct contact information for care management team Pharmacy referral for medication review. Patient given phone number for [facility] transportation if needed. Will notify PCP of need for testing strips and dietician referral. Collaborated with PCP for cardiology appointment. Collaborated with Care Guide for dental resources. Care guide referral for dental resources.-completed Patient Goals/Self-Care Activities Over the next 30 days, patient will:  -  Patient will take medications as prescribed. Calls pharmacy for medication refills Calls provider office for new concerns or questions  Follow Up Plan: The Managed Medicaid care management team will reach out to the patient again over the next 30 days.  The patient has been provided with contact information for the Managed Medicaid care management team and has been advised to call with any health related questions or concerns.

## 2022-04-23 NOTE — Patient Outreach (Signed)
Medicaid Managed Care   Nurse Care Manager Note  04/23/2022 Name:  Alison [name] MRN:  [id number] DOB:  Feb 05, 1976  Alison [name] is an 46 y.o. year old female who is a primary patient of Bo Merino, FNP.  [facility] Managed Care Coordination team was consulted for assistance with:    Chronic healthcare management needs, DM, chronic pain, OSA, migraines, anxiety/depression/PTSD/bipolar, h/o lymphoma, kidney and liver transplant, fibromyalgia  Alison [name] was given information about Medicaid Managed Care Coordination team services today. [name] Patient agreed to services and verbal consent obtained.  Engaged with patient by telephone for follow up visit in response to provider referral for case management and/or care coordination services.   Assessments/Interventions:  Review of past medical history, allergies, medications, health status, including review of consultants reports, laboratory and other test data, was performed as part of comprehensive evaluation and provision of chronic care management services.  SDOH (Social Determinants of Health) assessments and interventions performed: SDOH Interventions    Flowsheet Row Most Recent Value  SDOH Interventions   Financial Strain Interventions Intervention Not Indicated  Physical Activity Interventions Intervention Not Indicated     Care Plan  Allergies  Allergen Reactions   Penicillin G Itching, Rash, Anaphylaxis and Palpitations   Lamictal [Lamotrigine] Rash   Carbamazepine Other (See Comments)    Medication interaction-prograf    Hydrocodone-Acetaminophen Itching   Naproxen Itching   Medications Reviewed Today     Reviewed by [name], RN (Registered Nurse) on 04/23/22 at 1446  Med List Status: <None>   Medication Order Taking? Sig Documenting Provider Last Dose Status Informant  baclofen (LIORESAL) 10 MG tablet [id number] No Take 10 mg by mouth 2 (two) times daily. [provider]  Taking Active Self  benztropine (COGENTIN) 1 MG tablet [id number] No Take 1 tablet (1 mg total) by mouth daily as needed for tremors.  Patient not taking: Reported on 03/19/2022   [name], MD Not Taking Active   Cholecalciferol (VITAMIN D) 125 MCG (5000 UT) CAPS [id number] No Take 5,000 Units by mouth daily. [provider] Taking Active Self  CRANBERRY PO [id number] No Take 1 capsule by mouth in the morning and at bedtime. [provider] Taking Active Self  Dulaglutide 4.5 MG/0.5ML SOPN [id number] No Inject 4.5 mg into the skin once a week. [provider] Taking Active   eszopiclone (LUNESTA) 2 MG TABS tablet [id number]  TAKE ONE TABLET BY MOUTH EVERYDAY AT BEDTIME AS NEEDED FOR SLEEP. take immediately BEFORE bedtime [name], [name], MD  Active   ezetimibe (ZETIA) 10 MG tablet [id number] No Take 10 mg by mouth every morning. [provider] Taking Active Self  fluticasone (FLONASE) 50 MCG/ACT nasal spray [id number] No Place 1 spray into both nostrils daily as needed for allergies.  Patient not taking: Reported on 03/19/2022   [provider] Not Taking Active Self  icosapent Ethyl (VASCEPA) 1 g capsule [id number] No Take 2 capsules (2 g total) by mouth 2 (two) times daily. [name], MD Taking Active Self           Med Note Jodi Mourning, [name]   Tue Mar 19, 2022 10:24 AM) Taking 1 capsule twice daily per Dr. Ronnald Collum  lisinopril (ZESTRIL) 20 MG tablet [id number] No Take 1 tablet (20 mg total) by mouth daily. [name], PA-C Taking Active Self  lurasidone (LATUDA) 20 MG TABS tablet [id number] No Take 1 tablet (20 mg total) by mouth daily with supper. [name], MD  Taking Active   Magnesium 500 MG CAPS [id number] No Take 500 mg by mouth daily. [provider] Taking Active Self  Melatonin 10 MG TABS [id number] No Take 10 mg by mouth at bedtime. [provider] Taking Active Self  metFORMIN (GLUCOPHAGE) 1000 MG tablet  [id number] No TAKE ONE TABLET BY MOUTH TWICE DAILY with meals Bo Merino, FNP Taking Active Self  MYRBETRIQ 50 MG TB24 tablet [id number] No TAKE ONE TABLET BY MOUTH ONCE DAILY Vaillancourt, Samantha, PA-C Taking Active   naloxone (NARCAN) nasal spray 4 mg/0.1 mL [id number] No Place 0.4 mg into the nose once.  Patient not taking: Reported on 03/19/2022   [provider] Not Taking Active Self  oxyCODONE ER [facility] ER) 13.5 MG C12A [id number] No Take 13.5 mg by mouth 2 (two) times daily. [provider] Taking Active Self  pregabalin (LYRICA) 200 MG capsule [id number] No Take 1 capsule (200 mg total) by mouth 2 (two) times daily. [name], MD Taking Active   rosuvastatin (CRESTOR) 10 MG tablet [id number] No Take 1 tablet (10 mg total) by mouth daily. [name], MD Taking Active   tacrolimus (PROGRAF) 1 MG capsule [id number] No Take by mouth. [provider] Taking Active   trimethoprim (TRIMPEX) 100 MG tablet [id number] No Take 1 tablet (100 mg total) by mouth daily.  Patient not taking: Reported on 03/19/2022   [name], MD Not Taking Active   UBRELVY 50 MG TABS [id number] No Take 1 tablet by mouth daily as needed. [provider] Taking Active   venlafaxine XR (EFFEXOR XR) 75 MG 24 hr capsule [id number] No Take 1 capsule (75 mg total) by mouth daily with breakfast. [name], MD Taking Active   XYLIMELTS 550 MG DISK [id number] No Take 550 mg by mouth every 8 (eight) hours as needed (dry mouth). [provider] Taking Active Self           Patient Active Problem List   Diagnosis Date Noted   Encounter for screening colonoscopy    Menorrhagia with irregular cycle    Akathisia 10/24/2021   At risk for prolonged QT interval syndrome 09/12/2021   Prolonged Q-T interval on ECG 08/29/2021   Insomnia 07/20/2021   Bipolar disorder, in full remission, most recent episode mixed ([place]) 07/20/2021   Bipolar 1 disorder, mixed, mild  ([place]) 05/09/2021   GAD (generalized anxiety disorder) 05/09/2021   Hepatic cirrhosis ([place])    Opioid dependence with opioid-induced disorder ([place]) 12/20/2020   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[place]) 12/20/2020   Bipolar disorder, in full remission, most recent episode depressed ([place]) 09/27/2020   Suprapubic pain 07/25/2020   Bipolar 1 disorder, depressed, mild ([place]) 07/10/2020   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   Edema of lower extremity 09/16/2019   Carpal tunnel syndrome, left 07/26/2019   Cubital tunnel syndrome on left 07/26/2019   Bipolar I disorder, most recent episode depressed ([place]) 07/12/2019   PTSD (post-traumatic stress disorder) 38/17/7116   Umbilical hernia without obstruction and without gangrene 06/17/2019   Encounter for surveillance of abnormal nevi 06/17/2019   Pineal gland cyst 06/17/2019   Chronic hip pain, left 06/02/2019   Lumbar spondylosis 06/02/2019   Mouth dryness 06/02/2019   Obesity (BMI 35.0-39.9 without comorbidity) 06/02/2019   Goals of care, counseling/discussion 05/18/2019   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[place]) 05/18/2019   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[place]) 05/18/2019   Occipital neuralgia 05/13/2019   Plantar fasciitis of left foot 04/27/2019  Fibromyalgia 03/23/2019   Systemic lupus erythematosus (SLE) in adult [facility]) 03/23/2019   Essential hypertension 03/23/2019   Hepatitis 03/23/2019   Mass of left kidney 03/23/2019   Diabetes mellitus ([place]) 03/23/2019   Nausea without vomiting 03/23/2019   Neuropathy 03/23/2019   Cancer ([place]) 03/23/2019   Hyperlipidemia 03/23/2019   Osteoarthritis 03/23/2019   Trigeminal neuralgia 03/23/2019   Chronic renal disease, stage III ([place]) 03/23/2019   Nephrolithiasis 03/23/2019   Migraine 03/23/2019   OSA on CPAP 03/23/2019   Fibrocystic breast 03/23/2019   Heart murmur 03/23/2019   Bipolar disorder, in partial  remission, most recent episode depressed ([place]) 07/02/2017   Abnormal uterine bleeding 03/18/2017   Major depressive disorder, recurrent ([place]) 03/18/2017   Morbid obesity ([place]) 03/18/2017   Secondary hyperparathyroidism ([place]) 02/07/2017   Neuropathic pain 11/19/2016   Post herpetic neuralgia 11/19/2016   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[place]) 01/13/2015   Lumbar disc disease with radiculopathy 07/26/2013   S/P liver transplant ([place]) 07/26/2013   Type 2 diabetes mellitus, uncontrolled 07/26/2013   Facial nerve disorder 06/29/2012   Low back pain 12/26/2010   Conditions to be addressed/monitored per PCP order:  Chronic healthcare management needs, DM, chronic pain, OSA, migraines, anxiety/depression/PTSD/bipolar, h/o lymphoma, kidney and liver transplant, fibromyalgia, HLD, SLE  Care Plan : Chronic Pain (Adult)  Updates made by [name], RN since 04/23/2022 12:00 AM     Problem: Chronic Pain Management-fibromyalgia   Priority: High  Onset Date: 01/22/2021     Long-Range Goal: Fibromyalgia pain managed-new pain management provider   Start Date: 08/14/2020  Expected End Date: 07/24/2022  Recent Progress: On track  Priority: High  Note:   Current Barriers:  Knowledge deficits related to pain management  04/23/22:  Patient states pain is managed overall-some facial pain-meds adjusted.  Nurse Case Manager Clinical Goal(s):  Over the next 45 days, patient will work with [facility] to address needs related to referral for pain management provider and associated care coordination needs. Update 09/22/20:  patient is currently seeing Dr. Humphrey Rolls at [facility].  Interventions:  Inter-disciplinary care team collaboration (see longitudinal plan of care) Evaluation of current treatment plan related to fibromyalgia  and patient's adherence to plan as established by provider. Update 06/15/21:  Patient taking Baclofen again, Effexor added. Collaborated with primary care  provider regarding recommendations and referral to pain management provider. Discussed plans with patient for ongoing care management follow up and provided patient with direct contact information for care management team Anticipate pain education program, pain management support as part of pain management referral.       Patient Goals/Self-Care Activities Over the next 45 days, patient will:  -Attends all scheduled provider appointments  develop a personal pain management plan with your pain management provider.  Follow Up Plan:  The Managed Medicaid care management team will reach out to the patient again over the next 30 days    Care Plan : Wellness (Adult)  Updates made by [name], RN since 04/23/2022 12:00 AM     Problem: Medication Adherence (Wellness)   Priority: High  Onset Date: 01/22/2021     Long-Range Goal: Medication Adherence Maintained   Start Date: 09/06/2020  Expected End Date: 06/15/2022  Recent Progress: On track  Priority: High  Note:   Current Barriers:  Chronic Disease Management of chronic health conditions  04/23/22:  patient without complaint today-continues to lose weight, [id number].  Blood sugars 140's.  Some muscle aches and facila pain-meds  adjusted. Nurse Case Manager Clinical Goal(s):  Over the next 30 days, patient will work with CM team pharmacist to review current medications. Update 01/22/21:  Patient met with Pharmacist and continues to follow. Over the next 30 days, patient will meet with Nutritionist and check her blood sugars. Over the next 30 days, patient will attend all scheduled appointments.  Interventions:  Inter-disciplinary care team collaboration (see longitudinal plan of care) Evaluation of current treatment plan and patient's adherence to plan as established by provider. Advised patient to contact her PCP for any medication needs. Reviewed medications with patient. Collaborated with pharmacy regarding medications.  Discussed  plans with patient for ongoing care management follow up and provided patient with direct contact information for care management team Pharmacy referral for medication review. Patient given phone number for [facility] transportation if needed. Will notify PCP of need for testing strips and dietician referral. Collaborated with PCP for cardiology appointment. Collaborated with Care Guide for dental resources. Care guide referral for dental resources.-completed Patient Goals/Self-Care Activities Over the next 30 days, patient will:  -Patient will take medications as prescribed. Calls pharmacy for medication refills Calls provider office for new concerns or questions  Follow Up Plan: The Managed Medicaid care management team will reach out to the patient again over the next 30 days.  The patient has been provided with contact information for the Managed Medicaid care management team and has been advised to call with any health related questions or concerns.    Follow Up:  Patient agrees to Care Plan and Follow-up.  Plan: The Managed Medicaid care management team will reach out to the patient again over the next 30 days. and The  Patient has been provided with contact information for the Managed Medicaid care management team and has been advised to call with any health related questions or concerns.  Date/time of next scheduled RN care management/care coordination outreach:  05/24/22 at 230.

## 2022-04-24 DIAGNOSIS — M797 Fibromyalgia: Secondary | ICD-10-CM | POA: Diagnosis not present

## 2022-04-26 DIAGNOSIS — G4733 Obstructive sleep apnea (adult) (pediatric): Secondary | ICD-10-CM | POA: Diagnosis not present

## 2022-04-29 ENCOUNTER — Other Ambulatory Visit: Payer: Self-pay

## 2022-04-29 DIAGNOSIS — F4310 Post-traumatic stress disorder, unspecified: Secondary | ICD-10-CM | POA: Diagnosis not present

## 2022-04-29 DIAGNOSIS — F3181 Bipolar II disorder: Secondary | ICD-10-CM | POA: Diagnosis not present

## 2022-04-29 DIAGNOSIS — F411 Generalized anxiety disorder: Secondary | ICD-10-CM | POA: Diagnosis not present

## 2022-05-05 ENCOUNTER — Encounter: Payer: Self-pay | Admitting: Oncology

## 2022-05-13 ENCOUNTER — Inpatient Hospital Stay: Payer: Medicaid Other | Attending: Oncology | Admitting: Nurse Practitioner

## 2022-05-13 ENCOUNTER — Encounter: Payer: Self-pay | Admitting: Oncology

## 2022-05-13 ENCOUNTER — Inpatient Hospital Stay: Payer: Medicaid Other

## 2022-05-13 ENCOUNTER — Telehealth: Payer: Self-pay | Admitting: Cardiology

## 2022-05-13 DIAGNOSIS — C884 Extranodal marginal zone B-cell lymphoma of mucosa-associated lymphoid tissue [MALT-lymphoma]: Secondary | ICD-10-CM | POA: Insufficient documentation

## 2022-05-13 DIAGNOSIS — Z79899 Other long term (current) drug therapy: Secondary | ICD-10-CM

## 2022-05-13 DIAGNOSIS — I129 Hypertensive chronic kidney disease with stage 1 through stage 4 chronic kidney disease, or unspecified chronic kidney disease: Secondary | ICD-10-CM | POA: Insufficient documentation

## 2022-05-13 DIAGNOSIS — N183 Chronic kidney disease, stage 3 unspecified: Secondary | ICD-10-CM | POA: Insufficient documentation

## 2022-05-13 DIAGNOSIS — Z944 Liver transplant status: Secondary | ICD-10-CM | POA: Insufficient documentation

## 2022-05-13 DIAGNOSIS — Z809 Family history of malignant neoplasm, unspecified: Secondary | ICD-10-CM | POA: Insufficient documentation

## 2022-05-13 DIAGNOSIS — E782 Mixed hyperlipidemia: Secondary | ICD-10-CM

## 2022-05-13 DIAGNOSIS — E1122 Type 2 diabetes mellitus with diabetic chronic kidney disease: Secondary | ICD-10-CM | POA: Insufficient documentation

## 2022-05-13 DIAGNOSIS — G620 Drug-induced polyneuropathy: Secondary | ICD-10-CM | POA: Insufficient documentation

## 2022-05-13 NOTE — Telephone Encounter (Signed)
Patient has upcoming orders for HFP and lipid Please change order for medical mall   Terral for patient to complete

## 2022-05-13 NOTE — Telephone Encounter (Signed)
Lab orders changed.

## 2022-05-14 DIAGNOSIS — F3181 Bipolar II disorder: Secondary | ICD-10-CM | POA: Diagnosis not present

## 2022-05-14 DIAGNOSIS — F411 Generalized anxiety disorder: Secondary | ICD-10-CM | POA: Diagnosis not present

## 2022-05-14 DIAGNOSIS — F4310 Post-traumatic stress disorder, unspecified: Secondary | ICD-10-CM | POA: Diagnosis not present

## 2022-05-15 ENCOUNTER — Encounter: Payer: Self-pay | Admitting: Medical Oncology

## 2022-05-15 ENCOUNTER — Inpatient Hospital Stay: Payer: Medicaid Other

## 2022-05-15 ENCOUNTER — Inpatient Hospital Stay (HOSPITAL_BASED_OUTPATIENT_CLINIC_OR_DEPARTMENT_OTHER): Payer: Medicaid Other | Admitting: Medical Oncology

## 2022-05-15 VITALS — BP 100/72 | HR 66 | Temp 96.9°F | Resp 17 | Wt 239.6 lb

## 2022-05-15 DIAGNOSIS — C8580 Other specified types of non-Hodgkin lymphoma, unspecified site: Secondary | ICD-10-CM

## 2022-05-15 DIAGNOSIS — G500 Trigeminal neuralgia: Secondary | ICD-10-CM | POA: Diagnosis not present

## 2022-05-15 DIAGNOSIS — Z8572 Personal history of non-Hodgkin lymphomas: Secondary | ICD-10-CM | POA: Diagnosis not present

## 2022-05-15 DIAGNOSIS — Z79899 Other long term (current) drug therapy: Secondary | ICD-10-CM | POA: Diagnosis not present

## 2022-05-15 DIAGNOSIS — I129 Hypertensive chronic kidney disease with stage 1 through stage 4 chronic kidney disease, or unspecified chronic kidney disease: Secondary | ICD-10-CM | POA: Diagnosis not present

## 2022-05-15 DIAGNOSIS — Z08 Encounter for follow-up examination after completed treatment for malignant neoplasm: Secondary | ICD-10-CM

## 2022-05-15 DIAGNOSIS — N183 Chronic kidney disease, stage 3 unspecified: Secondary | ICD-10-CM | POA: Diagnosis not present

## 2022-05-15 DIAGNOSIS — E538 Deficiency of other specified B group vitamins: Secondary | ICD-10-CM | POA: Diagnosis not present

## 2022-05-15 DIAGNOSIS — C884 Extranodal marginal zone B-cell lymphoma of mucosa-associated lymphoid tissue [MALT-lymphoma]: Secondary | ICD-10-CM | POA: Diagnosis present

## 2022-05-15 DIAGNOSIS — E1122 Type 2 diabetes mellitus with diabetic chronic kidney disease: Secondary | ICD-10-CM | POA: Diagnosis not present

## 2022-05-15 DIAGNOSIS — Z809 Family history of malignant neoplasm, unspecified: Secondary | ICD-10-CM | POA: Diagnosis not present

## 2022-05-15 DIAGNOSIS — G620 Drug-induced polyneuropathy: Secondary | ICD-10-CM | POA: Diagnosis not present

## 2022-05-15 DIAGNOSIS — D696 Thrombocytopenia, unspecified: Secondary | ICD-10-CM

## 2022-05-15 DIAGNOSIS — E038 Other specified hypothyroidism: Secondary | ICD-10-CM | POA: Diagnosis not present

## 2022-05-15 DIAGNOSIS — Z944 Liver transplant status: Secondary | ICD-10-CM | POA: Diagnosis not present

## 2022-05-15 DIAGNOSIS — E559 Vitamin D deficiency, unspecified: Secondary | ICD-10-CM | POA: Diagnosis not present

## 2022-05-15 LAB — COMPREHENSIVE METABOLIC PANEL
ALT: 17 U/L (ref 0–44)
AST: 22 U/L (ref 15–41)
Albumin: 3.9 g/dL (ref 3.5–5.0)
Alkaline Phosphatase: 45 U/L (ref 38–126)
Anion gap: 10 (ref 5–15)
BUN: 26 mg/dL — ABNORMAL HIGH (ref 6–20)
CO2: 26 mmol/L (ref 22–32)
Calcium: 9.4 mg/dL (ref 8.9–10.3)
Chloride: 103 mmol/L (ref 98–111)
Creatinine, Ser: 1.18 mg/dL — ABNORMAL HIGH (ref 0.44–1.00)
GFR, Estimated: 57 mL/min — ABNORMAL LOW (ref >60)
Glucose, Bld: 159 mg/dL — ABNORMAL HIGH (ref 70–99)
Potassium: 4.3 mmol/L (ref 3.5–5.1)
Sodium: 139 mmol/L (ref 135–145)
Total Bilirubin: 0.6 mg/dL (ref 0.3–1.2)
Total Protein: 7 g/dL (ref 6.5–8.1)

## 2022-05-15 LAB — CBC WITH DIFFERENTIAL/PLATELET
Abs Immature Granulocytes: 0.01 10*3/uL (ref 0.00–0.07)
Basophils Absolute: 0.1 10*3/uL (ref 0.0–0.1)
Basophils Relative: 1 %
Eosinophils Absolute: 0.5 10*3/uL (ref 0.0–0.5)
Eosinophils Relative: 6 %
HCT: 41.3 % (ref 36.0–46.0)
Hemoglobin: 13.6 g/dL (ref 12.0–15.0)
Immature Granulocytes: 0 %
Lymphocytes Relative: 32 %
Lymphs Abs: 2.3 10*3/uL (ref 0.7–4.0)
MCH: 28.9 pg (ref 26.0–34.0)
MCHC: 32.9 g/dL (ref 30.0–36.0)
MCV: 87.9 fL (ref 80.0–100.0)
Monocytes Absolute: 0.6 10*3/uL (ref 0.1–1.0)
Monocytes Relative: 8 %
Neutro Abs: 3.8 10*3/uL (ref 1.7–7.7)
Neutrophils Relative %: 53 %
Platelets: 120 10*3/uL — ABNORMAL LOW (ref 150–400)
RBC: 4.7 MIL/uL (ref 3.87–5.11)
RDW: 13.2 % (ref 11.5–15.5)
WBC: 7.2 10*3/uL (ref 4.0–10.5)
nRBC: 0 % (ref 0.0–0.2)

## 2022-05-15 LAB — LACTATE DEHYDROGENASE: LDH: 112 U/L (ref 98–192)

## 2022-05-15 MED ORDER — HEPARIN SOD (PORK) LOCK FLUSH 100 UNIT/ML IV SOLN
500.0000 [IU] | Freq: Once | INTRAVENOUS | Status: AC
  Administered 2022-05-15: 500 [IU] via INTRAVENOUS
  Filled 2022-05-15: qty 5

## 2022-05-15 MED ORDER — SODIUM CHLORIDE 0.9% FLUSH
10.0000 mL | Freq: Once | INTRAVENOUS | Status: AC
  Administered 2022-05-15: 10 mL via INTRAVENOUS
  Filled 2022-05-15: qty 10

## 2022-05-15 NOTE — Progress Notes (Signed)
I week ago ,Pt started to have itching under Right arm. She has since noted a lump in right axillary. Possible swelling in left neck/ jaw area. No pain in either area.

## 2022-05-15 NOTE — Progress Notes (Signed)
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  She is currently on CellCept and tacrolimus for the same.  She also has a history of post transplant lymphoproliferative disorder/DLBCL that was diagnosed in 2016.  She is s/p 6 cycles of R-CHOP chemotherapy back then and was in complete remission 1.  Her other past medical history significant for migraines, stage III chronic kidney disease, chemo-induced peripheral neuropathy, hypertension among other medical problems.  With regards to her diffuse large B-cell lymphoma she was getting surveillance scans and this was all done in [place].  She has now moved to [facility] to be with her significant other.  Patient was noted to have a prior kidney mass before which was biopsied and was not consistent with malignancy.   She underwent CT neck with contrast before she left [place] on 02/22/2019.  She was noted to have soft tissue masses in both the parotid glands which were new as compared to prior exams and possibly represent lymph nodes.  The largest one was seen in the left parotid gland measuring 1.3 x 1.1 cm.  Before she could get a work-up for this patient moved to [place] and has not had any further work-up yet   Her other prior imaging is as follows: MRI thoracic spine without contrast in August 2019 showed moderate multilevel spondylosis but no  evidence of lymphoma.  Multinodular thyroid in June 2019.  MRI cervical spine May 2019 again showed spondylosis but no other acute pathology.  MRI brain showed incidental partial opacification of the right and right sinus.  No evidence of malignancy.  I do not have any other PET CT scan or treatment records from the past.   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  Bone marrow biopsy was negative for lymphoma.  Patient completed 4 cycles of weekly RituxanIn September 2020.  Repeat PET CT scan showed interval resolution of the parotid lesions compatible with complete response to therapy  Reason for Consult: Alison [name] is a 46 y.o. female who presents today for:  Lymphadenopathy: Patient reports that for the past week or so she has noticed a lump under her right axilla in addition to some itching of this area. Calmed down over the past 2 days. No pain, fevers, discharge, skin lesions. She does continue to have unintentional weight loss and night sweats which have been present for 6 months and 1 month respectively. No other palpable lesions of note.   Denies any neurologic complaints. Denies recent fevers or illnesses. Denies any easy bleeding or bruising. Reports good appetite and denies weight loss. Denies chest pain. Denies any nausea, vomiting, constipation, or diarrhea. Denies urinary complaints. Patient offers no further specific complaints today.    PAST MEDICAL HISTORY: Past Medical History:  Diagnosis Date   Abnormal uterine bleeding    Allergy    Anxiety    Arthritis    Bipolar disorder (manic depression) (HCC)    Chronic kidney  failure    Chronic renal disease, stage III (HCC)    Diabetes mellitus without complication ([place])    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[place]) 2015   Right axillary lymph node resected and chemo tx's.   Dyspnea    FH: trigeminal neuralgia    GERD (gastroesophageal reflux disease)    Heart murmur     Hepatic cirrhosis (HCC)    History of kidney stones    Hypertension    Kidney mass    Long Q-T syndrome    Lupoid hepatitis (HCC)    Lupus (HCC)    Major depressive disorder    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[place]) 06/2019   Chemo tx's   Migraine    Morbid obesity (HCC)    Neuromuscular disorder (HCC)    neuropathy   Neuropathy    Personality disorder (HCC)    Pineal gland cyst    Post herpetic neuralgia    PTSD (post-traumatic stress disorder)    Renal disorder    S/P liver transplant ([place]) 1993   Sleep apnea    has not recieved cpap yet-other one was recalled    PAST SURGICAL HISTORY:  Past Surgical History:  Procedure Laterality Date   BONE MARROW BIOPSY  01/13/2015   BREAST BIOPSY  12/2014   BREAST BIOPSY  2011   BREAST SURGERY     CHOLECYSTECTOMY     COLONOSCOPY     COLONOSCOPY WITH PROPOFOL N/A 02/28/2022   Procedure: COLONOSCOPY WITH PROPOFOL;  Surgeon: [name], MD;  Location: [facility] ENDOSCOPY;  Service: Endoscopy;  Laterality: N/A;   ESOPHAGOGASTRODUODENOSCOPY     ESOPHAGOGASTRODUODENOSCOPY (EGD) WITH PROPOFOL N/A 01/09/2021   Procedure: ESOPHAGOGASTRODUODENOSCOPY (EGD) WITH PROPOFOL;  Surgeon: [name], MD;  Location: [facility] ENDOSCOPY;  Service: Endoscopy;  Laterality: N/A;   HERNIA REPAIR     x4-all from liver transplant   HYSTEROSCOPY WITH D & C N/A 11/13/2021   Procedure: DILATATION AND CURETTAGE /HYSTEROSCOPY;  Surgeon: Homero Fellers, MD;  Location: [facility] ORS;  Service: Gynecology;  Laterality: N/A;   infusaport     LIVER TRANSPLANT  12/17/1991   LUMBAR PUNCTURE     PORT A CATH INJECTION ([facility] HX)     RENAL BIOPSY     tumor removal  2015   cancer right side    HEMATOLOGY/ONCOLOGY HISTORY:  Oncology History Overview Note   Patient is a 46 year old female with a past medical history significant for 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  She is currently on CellCept and tacrolimus for the same.  She also has a history of post  transplant lymphoproliferative disorder/DLBCL that was diagnosed in 2016.  She is s/p 6 cycles of R-CHOP chemotherapy back then and was in complete remission 1.  Her other past medical history significant for migraines, stage III chronic kidney disease, chemo-induced peripheral neuropathy, hypertension among other medical problems.  With regards to her diffuse large B-cell lymphoma she was getting surveillance scans and this was all done in [place].  She has now moved to [facility] to be with her significant other.  Patient was noted to have a prior kidney mass before which was biopsied and was not consistent with malignancy.   She underwent CT neck with contrast before she left [place] on 02/22/2019.  She was noted to have soft tissue masses in both the parotid glands which were new as compared to prior exams and possibly represent lymph nodes.  The largest one was seen in the left  parotid gland measuring 1.3 x 1.1 cm.  Before she could get a work-up for this patient moved to [place] and has not had any further work-up yet   Her other prior imaging is as follows: MRI thoracic spine without contrast in August 2019 showed moderate multilevel spondylosis but no evidence of lymphoma.  Multinodular thyroid in June 2019.  MRI cervical spine May 2019 again showed spondylosis but no other acute pathology.  MRI brain showed incidental partial opacification of the right and right sinus.  No evidence of malignancy.  I do not have any other PET CT scan or treatment records from the past.   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  Bone marrow biopsy was negative for lymphoma     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[place])  05/18/2019 Initial Diagnosis   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[place])   05/28/2019 -  Chemotherapy   The patient had riTUXimab-pvvr (RUXIENCE) 900 mg in sodium chloride 0.9 % 250 mL chemo infusion, , Intravenous, Once, 4 of 4  cycles Administration:  (05/28/2019),  (06/11/2019),  (06/18/2019)  for chemotherapy treatment.      ALLERGIES:  is allergic to penicillin g, lamictal [lamotrigine], carbamazepine, hydrocodone-acetaminophen, and naproxen.  MEDICATIONS:  Current Outpatient Medications  Medication Sig Dispense Refill   baclofen (LIORESAL) 10 MG tablet Take 10 mg by mouth 2 (two) times daily.     benztropine (COGENTIN) 1 MG tablet Take 1 tablet (1 mg total) by mouth daily as needed for tremors. 90 tablet 0   Cholecalciferol (VITAMIN D) 125 MCG (5000 UT) CAPS Take 5,000 Units by mouth daily.     CRANBERRY PO Take 1 capsule by mouth in the morning and at bedtime.     Dulaglutide 4.5 MG/0.5ML SOPN Inject 4.5 mg into the skin once a week.     eszopiclone (LUNESTA) 2 MG TABS tablet TAKE ONE TABLET BY MOUTH EVERYDAY AT BEDTIME AS NEEDED FOR SLEEP. take immediately BEFORE bedtime 30 tablet 1   ezetimibe (ZETIA) 10 MG tablet Take 10 mg by mouth every morning.     fluticasone (FLONASE) 50 MCG/ACT nasal spray Place 1 spray into both nostrils daily as needed for allergies.     icosapent Ethyl (VASCEPA) 1 g capsule Take 2 capsules (2 g total) by mouth 2 (two) times daily. 120 capsule 5   lisinopril (ZESTRIL) 20 MG tablet Take 1 tablet (20 mg total) by mouth daily. 90 tablet 3   lurasidone (LATUDA) 20 MG TABS tablet Take 1 tablet (20 mg total) by mouth daily with supper. 90 tablet 0   Magnesium 500 MG CAPS Take 500 mg by mouth daily.     Melatonin 10 MG TABS Take 10 mg by mouth at bedtime.     metFORMIN (GLUCOPHAGE) 1000 MG tablet TAKE ONE TABLET BY MOUTH TWICE DAILY with meals 180 tablet 2   MYRBETRIQ 50 MG TB24 tablet TAKE ONE TABLET BY MOUTH ONCE DAILY 90 tablet 3   oxyCODONE ER (XTAMPZA ER) 13.5 MG C12A Take 13.5 mg by mouth 2 (two) times daily.     pregabalin (LYRICA) 200 MG capsule Take 1 capsule (200 mg total) by mouth 2 (two) times daily. 60 capsule 0   rosuvastatin (CRESTOR) 10 MG tablet Take 1 tablet (10 mg total)  by mouth daily. 90 tablet 3   tacrolimus (PROGRAF) 1 MG capsule Take by mouth.     UBRELVY 50 MG TABS Take 1 tablet by mouth daily as needed.     venlafaxine  XR (EFFEXOR XR) 75 MG 24 hr capsule Take 1 capsule (75 mg total) by mouth daily with breakfast. 90 capsule 0   XYLIMELTS 550 MG DISK Take 550 mg by mouth every 8 (eight) hours as needed (dry mouth).     naloxone (NARCAN) nasal spray 4 mg/0.1 mL Place 0.4 mg into the nose once. (Patient not taking: Reported on 03/19/2022)     No current facility-administered medications for this visit.    VITAL SIGNS: BP 100/72 (BP Location: Left Arm, Patient Position: Sitting)   Pulse 66   Temp (!) 96.9 F (36.1 C) (Tympanic)   Resp 17   Wt 239 lb 9.6 oz (108.7 kg)   SpO2 98%   BMI 38.67 kg/m  Filed Weights   05/15/22 1006  Weight: 239 lb 9.6 oz (108.7 kg)    Estimated body mass index is 38.67 kg/m as calculated from the following:   Height as of 02/28/22: _0  (1.676 m).   Weight as of this encounter: 239 lb 9.6 oz (108.7 kg).  LABS: CBC:    Component Value Date/Time   WBC 7.2 05/15/2022 0949   HGB 13.6 05/15/2022 0949   HGB 14.6 10/16/2021 1122   HCT 41.3 05/15/2022 0949   HCT 44.0 10/16/2021 1122   PLT 120 (L) 05/15/2022 0949   MCV 87.9 05/15/2022 0949   MCV 88 10/16/2021 1122   NEUTROABS 3.8 05/15/2022 0949   NEUTROABS 3.8 10/16/2021 1122   LYMPHSABS 2.3 05/15/2022 0949   LYMPHSABS 2.0 10/16/2021 1122   MONOABS 0.6 05/15/2022 0949   EOSABS 0.5 05/15/2022 0949   EOSABS 0.4 10/16/2021 1122   BASOSABS 0.1 05/15/2022 0949   BASOSABS 0.1 10/16/2021 1122   Comprehensive Metabolic Panel:    Component Value Date/Time   NA 138 12/03/2021 1410   K 4.3 12/03/2021 1410   CL 103 12/03/2021 1410   CO2 23 12/03/2021 1410   BUN 18 12/03/2021 1410   CREATININE 0.85 12/03/2021 1410   CREATININE 1.04 12/11/2020 0000   GLUCOSE 161 (H) 12/03/2021 1410   CALCIUM 10.0 12/03/2021 1410   AST 27 12/03/2021 1410   ALT 29 12/03/2021 1410    ALKPHOS 63 12/03/2021 1410   BILITOT 0.8 12/03/2021 1410   PROT 8.1 12/03/2021 1410   ALBUMIN 4.3 12/03/2021 1410    RADIOGRAPHIC STUDIES: No results found.  PERFORMANCE STATUS (ECOG) : 1 - Symptomatic but completely ambulatory  Review of Systems Unless otherwise noted, a complete review of systems is negative.  Physical Exam General: NAD Cardiovascular: regular rate and rhythm Pulmonary: clear ant fields Abdomen: soft, nontender, + bowel sounds GU: no suprapubic tenderness Extremities: no edema, no joint deformities Skin: no rashes Lymph: No preauricular, post auricular, AC/PC, subclavicular, submental palpable lymph nodes. There is a roughly 1.5 inch elongated palpable non tender node of the left axilla and a few scattered roughly 1 cm nodes of the right axilla which are also non-tender. No palpable abdominal or inguinal lymph nodes.  Neurological: Weakness but otherwise nonfocal  Assessment and Plan- Patient is a 46 y.o. female    Encounter Diagnoses  Name Primary?   Marginal zone lymphoma ([place]) Yes   Encounter for follow-up surveillance of lymphoma    Given her continued unintentional weight loss, new night sweats and palpable enlarged lymph nodes a PET scan has been ordered. Labs pending. We will have her return to clinic within 7 days of PET scan to review results with Dr. Janese Banks.    Patient expressed understanding and was in  agreement with this plan. She also understands that She can call clinic at any time with any questions, concerns, or complaints.   Thank you for allowing me to participate in the care of this very pleasant patient.   Time Total: 15  Visit consisted of counseling and education dealing with the complex and emotionally intense issues of symptom management in the setting of serious illness.Greater than 50%  of this time was spent counseling and coordinating care related to the above assessment and plan.  Signed by: Nelwyn Salisbury, PA-C

## 2022-05-20 ENCOUNTER — Encounter: Payer: Self-pay | Admitting: Oncology

## 2022-05-20 ENCOUNTER — Other Ambulatory Visit: Payer: Self-pay | Admitting: Student

## 2022-05-20 DIAGNOSIS — G500 Trigeminal neuralgia: Secondary | ICD-10-CM

## 2022-05-20 DIAGNOSIS — Z79899 Other long term (current) drug therapy: Secondary | ICD-10-CM | POA: Diagnosis not present

## 2022-05-20 DIAGNOSIS — E538 Deficiency of other specified B group vitamins: Secondary | ICD-10-CM

## 2022-05-21 ENCOUNTER — Telehealth (INDEPENDENT_AMBULATORY_CARE_PROVIDER_SITE_OTHER): Payer: Medicaid Other | Admitting: Psychiatry

## 2022-05-21 ENCOUNTER — Encounter: Payer: Self-pay | Admitting: Psychiatry

## 2022-05-21 DIAGNOSIS — F4310 Post-traumatic stress disorder, unspecified: Secondary | ICD-10-CM

## 2022-05-21 DIAGNOSIS — F411 Generalized anxiety disorder: Secondary | ICD-10-CM

## 2022-05-21 DIAGNOSIS — Z9189 Other specified personal risk factors, not elsewhere classified: Secondary | ICD-10-CM

## 2022-05-21 DIAGNOSIS — F3132 Bipolar disorder, current episode depressed, moderate: Secondary | ICD-10-CM

## 2022-05-21 DIAGNOSIS — G4701 Insomnia due to medical condition: Secondary | ICD-10-CM

## 2022-05-21 MED ORDER — VENLAFAXINE HCL ER 37.5 MG PO CP24: 37.5000 mg | ORAL_CAPSULE | Freq: Every day | ORAL | 0 refills | Status: DC

## 2022-05-21 MED ORDER — LURASIDONE HCL 20 MG PO TABS: 20.0000 mg | ORAL_TABLET | Freq: Every day | ORAL | 0 refills | Status: DC

## 2022-05-21 MED ORDER — VENLAFAXINE HCL ER 75 MG PO CP24: 75.0000 mg | ORAL_CAPSULE | Freq: Every day | ORAL | 0 refills | Status: DC

## 2022-05-21 MED ORDER — BENZTROPINE MESYLATE 1 MG PO TABS: 1.0000 mg | ORAL_TABLET | Freq: Every day | ORAL | 0 refills | Status: DC | PRN

## 2022-05-21 NOTE — Progress Notes (Unsigned)
Virtual Visit via Video Note  I connected with Alison [name] on 05/21/22 at  4:00 PM EDT by a video enabled telemedicine application and verified that I am speaking with the correct person using two identifiers.  Location Provider Location : [facility] Patient Location : Home  Participants: Patient , Provider    I discussed the limitations of evaluation and management by telemedicine and the availability of in person appointments. The patient expressed understanding and agreed to proceed.   I discussed the assessment and treatment plan with the patient. The patient was provided an opportunity to ask questions and all were answered. The patient agreed with the plan and demonstrated an understanding of the instructions.   The patient was advised to call back or seek an in-person evaluation if the symptoms worsen or if the condition fails to improve as anticipated.  [facility] MD OP Progress Note  05/22/2022 9:51 AM Alison [name]  MRN:  [id number]  Chief Complaint:  Chief Complaint  Patient presents with   Follow-up: 46 year old Caucasian female who has a history of bipolar disorder, PTSD, anxiety, recent diagnosis of lymphoma presented for medication management.   HPI: Alison [name] is a 46 year old Caucasian female, lives in [place], has a history of bipolar disorder, PTSD, multiple medical problems including fibromyalgia, chronic pain, trigeminal neuralgia, history of liver transplant, B-cell lymphoma, SLE, diabetes mellitus, OSA, chronic migraine headaches, hyperlipidemia, hypertension was evaluated by telemedicine today.  Patient today reports she is currently under the care of oncology, likely her lymphoma has returned since she has palpable enlarged lymph nodes both sides, axilla, and night sweats and weight loss.  She is currently scheduled for a PET scan.  That does worry her.  Patient reports she is worried about going through the treatment and the biopsy, it can be painful to go  through.  She does have good support system from her boyfriend who lives with her.  Patient reports since the past few days she has been sleeping a lot, staying sad, unmotivated, has anhedonia, low energy.  Patient reports since she has been sleeping a lot during the day sleep at night has been interrupted.  She however reports although she stays up for an hour or so when sleep is interrupted she is often able to go back to sleep.  She does have Lunesta available which helps.  Patient is currently compliant on her medications including Latuda, venlafaxine, Lunesta.  Does report tremors of bilateral upper extremities, however reports when she takes the Cogentin that does help.  Patient denies any suicidality, homicidality or perceptual disturbances.  Patient denies any other concerns today.    Visit Diagnosis:    ICD-10-CM   1. Bipolar 1 disorder, depressed, moderate (HCC)  F31.32 lurasidone (LATUDA) 20 MG TABS tablet    2. PTSD (post-traumatic stress disorder)  F43.10 venlafaxine XR (EFFEXOR XR) 75 MG 24 hr capsule    venlafaxine XR (EFFEXOR XR) 37.5 MG 24 hr capsule    3. GAD (generalized anxiety disorder)  F41.1 venlafaxine XR (EFFEXOR XR) 75 MG 24 hr capsule    venlafaxine XR (EFFEXOR XR) 37.5 MG 24 hr capsule    4. Insomnia due to medical condition  G47.01    OSA, mood    5. At risk for prolonged QT interval syndrome  Z91.89 benztropine (COGENTIN) 1 MG tablet      Past Psychiatric History: Reviewed past psychiatric history from progress note on 06/10/2019.  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   Chemo tx's   Migraine    Morbid obesity (HCC)    Neuromuscular disorder (HCC)    neuropathy   Neuropathy    Personality disorder (HCC)    Pineal gland cyst    Post herpetic neuralgia    PTSD (post-traumatic stress disorder)    Renal disorder    S/P liver transplant ([place]) 1993   Sleep apnea    has not recieved cpap yet-other one was recalled    Past Surgical History:  Procedure Laterality Date   BONE MARROW BIOPSY  01/13/2015   BREAST BIOPSY  12/2014   BREAST BIOPSY  2011   BREAST SURGERY     CHOLECYSTECTOMY     COLONOSCOPY     COLONOSCOPY WITH PROPOFOL N/A 02/28/2022   Procedure: COLONOSCOPY WITH PROPOFOL;  Surgeon: [name], MD;  Location: [facility] ENDOSCOPY;  Service: Endoscopy;  Laterality: N/A;   ESOPHAGOGASTRODUODENOSCOPY     ESOPHAGOGASTRODUODENOSCOPY (EGD) WITH PROPOFOL N/A 01/09/2021   Procedure: ESOPHAGOGASTRODUODENOSCOPY (EGD) WITH PROPOFOL;  Surgeon: [name], MD;  Location: [facility] ENDOSCOPY;  Service: Endoscopy;  Laterality: N/A;   HERNIA REPAIR     x4-all from liver transplant   HYSTEROSCOPY WITH D & C N/A 11/13/2021   Procedure: DILATATION AND CURETTAGE /HYSTEROSCOPY;  Surgeon: Homero Fellers, MD;  Location: [facility] ORS;  Service: Gynecology;  Laterality: N/A;   infusaport     LIVER TRANSPLANT  12/17/1991   LUMBAR PUNCTURE     PORT A CATH INJECTION ([place] [facility])     RENAL BIOPSY     tumor removal  2015   cancer right side    Family Psychiatric History: Reviewed family psychiatric history from progress note on 06/10/2019.  Family History:  Family History  Problem Relation Age of Onset   Heart disease Mother    Hypertension Mother    Cancer - Other Mother     Bipolar disorder Mother    Heart disease Father    Hypertension Father    Diabetes Father    Parkinson's disease Maternal Grandmother    Cancer Maternal Aunt    Cancer Maternal Uncle    Cancer Maternal Grandfather    Lupus Paternal Grandmother    Hypertension Brother     Social History: Reviewed social history from progress note on 06/10/2019. Social History   Socioeconomic History   Marital status: Single    Spouse name: Not on file   Number of children: 0   Years of education: 9   Highest education level: GED or equivalent  Occupational History   Occupation: disabled  Tobacco Use   Smoking status: Never   Smokeless tobacco: Never  Vaping Use   Vaping  Use: Never used  Substance and Sexual Activity   Alcohol use: Not Currently   Drug use: Not Currently    Comment: pain managment last used in early april   Sexual activity: Not Currently  Other Topics Concern   Not on file  Social History Narrative   Not on file   Social Determinants of Health   Financial Resource Strain: Low Risk  (04/23/2022)   Overall Financial Resource Strain (CARDIA)    Difficulty of Paying Living Expenses: Not very hard  Food Insecurity: No Food Insecurity (03/15/2022)   Hunger Vital Sign    Worried About Running Out of Food in the Last Year: Never true    Ran Out of Food in the Last Year: Never true  Transportation Needs: No Transportation Needs (03/15/2022)   PRAPARE - Hydrologist (Medical): No    Lack of Transportation (Non-Medical): No  Physical Activity: Sufficiently Active (04/23/2022)   Exercise Vital Sign    Days of Exercise per Week: 3 days    Minutes of Exercise per Session: 150+ min  Stress: No Stress Concern Present (01/08/2022)   [place]    Feeling of Stress : Only a little  Social Connections: Moderately Integrated (02/12/2022)   Social Connection and Isolation Panel [NHANES]    Frequency  of Communication with Friends and Family: More than three times a week    Frequency of Social Gatherings with Friends and Family: More than three times a week    Attends Religious Services: More than 4 times per year    Active Member of [employer] or Organizations: No    Attends Archivist Meetings: Never    Marital Status: Living with partner    Allergies:  Allergies  Allergen Reactions   Penicillin G Itching, Rash, Anaphylaxis and Palpitations   Lamictal [Lamotrigine] Rash   Carbamazepine Other (See Comments)    Medication interaction-prograf    Hydrocodone-Acetaminophen Itching   Naproxen Itching   Penicillins     Metabolic Disorder Labs: Lab Results  Component Value Date   HGBA1C 8.9 (A) 12/11/2020   MPG 209 06/05/2020   Lab Results  Component Value Date   PROLACTIN 13.4 07/15/2019   PROLACTIN 21.3 05/24/2019   Lab Results  Component Value Date   CHOL 173 12/03/2021   TRIG 195 (H) 12/03/2021   HDL 38 (L) 12/03/2021   CHOLHDL 4.6 (H) 12/03/2021   LDLCALC 101 (H) 12/03/2021   LDLCALC 119 (H) 09/07/2021   Lab Results  Component Value Date   TSH 2.650 07/15/2019   TSH 4.650 (H) 05/24/2019    Therapeutic Level Labs: No results found for: "LITHIUM" No results found for: "VALPROATE" No results found for: "CBMZ"  Current Medications: Current Outpatient Medications  Medication Sig Dispense Refill   baclofen (LIORESAL) 10 MG tablet Take 10 mg by mouth 2 (two) times daily.     Cholecalciferol (VITAMIN D) 125 MCG (5000 UT) CAPS Take 5,000 Units by mouth daily.     CRANBERRY PO Take 1 capsule by mouth in the morning and at bedtime.     Dulaglutide 4.5 MG/0.5ML SOPN Inject 4.5 mg into the skin once a week.     eszopiclone (LUNESTA) 2 MG TABS tablet TAKE ONE TABLET BY MOUTH EVERYDAY AT BEDTIME AS NEEDED FOR SLEEP. take immediately BEFORE bedtime 30 tablet 1   ezetimibe (ZETIA) 10 MG tablet Take 10 mg by mouth every morning.     fluticasone (  FLONASE) 50 MCG/ACT  nasal spray Place 1 spray into both nostrils daily as needed for allergies.     icosapent Ethyl (VASCEPA) 1 g capsule Take 2 capsules (2 g total) by mouth 2 (two) times daily. 120 capsule 5   lisinopril (ZESTRIL) 20 MG tablet Take 1 tablet (20 mg total) by mouth daily. 90 tablet 3   Magnesium 500 MG CAPS Take 500 mg by mouth daily.     Melatonin 10 MG TABS Take 10 mg by mouth at bedtime.     metFORMIN (GLUCOPHAGE) 1000 MG tablet TAKE ONE TABLET BY MOUTH TWICE DAILY with meals 180 tablet 2   MYRBETRIQ 50 MG TB24 tablet TAKE ONE TABLET BY MOUTH ONCE DAILY 90 tablet 3   naloxone (NARCAN) nasal spray 4 mg/0.1 mL Place 0.4 mg into the nose once.     oxyCODONE ER (XTAMPZA ER) 13.5 MG C12A Take 13.5 mg by mouth 2 (two) times daily.     pregabalin (LYRICA) 200 MG capsule Take 1 capsule (200 mg total) by mouth 2 (two) times daily. 60 capsule 0   rosuvastatin (CRESTOR) 10 MG tablet Take 1 tablet (10 mg total) by mouth daily. 90 tablet 3   tacrolimus (PROGRAF) 1 MG capsule Take by mouth.     UBRELVY 50 MG TABS Take 1 tablet by mouth daily as needed.     venlafaxine XR (EFFEXOR XR) 37.5 MG 24 hr capsule Take 1 capsule (37.5 mg total) by mouth daily with breakfast. Take along with 75 mg daily 90 capsule 0   XTAMPZA ER 9 MG C12A Take 1 capsule by mouth every 12 (twelve) hours. TAKES 13.5 MG DAILY     XYLIMELTS 550 MG DISK Take 550 mg by mouth every 8 (eight) hours as needed (dry mouth).     benztropine (COGENTIN) 1 MG tablet Take 1 tablet (1 mg total) by mouth daily as needed for tremors. 90 tablet 0   lurasidone (LATUDA) 20 MG TABS tablet Take 1 tablet (20 mg total) by mouth daily with supper. 90 tablet 0   venlafaxine XR (EFFEXOR XR) 75 MG 24 hr capsule Take 1 capsule (75 mg total) by mouth daily with breakfast. 90 capsule 0   No current facility-administered medications for this visit.     Musculoskeletal: Strength & Muscle Tone:  UTA Gait & Station:  Laying in bed Patient leans: N/A  Psychiatric  Specialty Exam: Review of Systems  Constitutional:  Positive for appetite change, fatigue and unexpected weight change.       Patient reports night sweats  Psychiatric/Behavioral:  Positive for dysphoric mood and sleep disturbance. The patient is nervous/anxious.   All other systems reviewed and are negative.   There were no vitals taken for this visit.There is no height or weight on file to calculate BMI.  General Appearance: Casual  Eye Contact:  Fair  Speech:  Clear and Coherent  Volume:  Normal  Mood:  Anxious and Depressed  Affect:  Depressed  Thought Process:  Goal Directed and Descriptions of Associations: Intact  Orientation:  Full (Time, Place, and Person)  Thought Content: Logical   Suicidal Thoughts:  No  Homicidal Thoughts:  No  Memory:  Immediate;   Fair Recent;   Fair Remote;   Fair  Judgement:  Good  Insight:  Fair  Psychomotor Activity:  Normal  Concentration:  Concentration: Fair and Attention Span: Fair  Recall:  [employer] of Knowledge: Fair  Language: Fair  Akathisia:  No  Handed:  Right  AIMS (if indicated): not done  Assets:  Communication Skills Desire for Improvement Housing Intimacy Social Support Transportation  ADL's:  Intact  Cognition: WNL  Sleep:   excessive   Screenings: AIMS    Flowsheet Row Video Visit from 03/18/2022 in [place] Video Visit from 02/14/2022 in [place] Office Visit from 01/21/2022 in [facility] Video Visit from 05/09/2021 in [facility] Total Score 0 0 0 0      GAD-7    Flowsheet Row Video Visit from 03/18/2022 in [facility] [place] Video Visit from 02/14/2022 in [place] Office Visit from 01/25/2022 in [facility] Video Visit from 10/24/2021 in [place] Office Visit from 10/19/2021 in [facility]  Total GAD-7 Score 3 10 7 9 20       PHQ2-9    Flowsheet Row Video Visit from 05/21/2022 in [place] Video Visit from 03/18/2022 in [place] Video Visit from 02/14/2022 in [place] Office Visit from 01/25/2022 in [facility] Office Visit from 01/21/2022 in [place]  PHQ-2 Total Score 5 0 1 4 2   PHQ-9 Total Score 17 -- -- 8 11      Flowsheet Row Video Visit from 05/21/2022 in [place] Admission (Discharged) from 02/28/2022 in [facility] Video Visit from 02/14/2022 in [place] Low Risk No Risk No Risk        Assessment and Plan: ALBENA COMES is a 46 year old Caucasian female on disability, has a history of bipolar disorder, PTSD, history of borderline personality disorder, chronic pain, fibromyalgia, SLE, diabetes, OSA, migraine headaches was evaluated by telemedicine today.  Patient with recent diagnosis of cancer, currently under the care of oncology, currently struggles with depressive symptoms, will benefit from the following plan.  Plan Bipolar disorder type I depressed-unstable Continue Latuda 20 mg p.o. daily with supper Increase venlafaxine 112.5 mg p.o. daily Cogentin 1 mg p.o. daily as needed for side effects  GAD-unstable Increase venlafaxine extended release 112.5 mg p.o. daily Patient advised to have more frequent psychotherapy sessions.  PTSD-stable Venlafaxine as prescribed  Insomnia-unstable Patient with excessive sleepiness during the day and interrupted sleep at night.  Patient advised to work on sleep hygiene techniques. Continue Lunesta 2 mg p.o. nightly Reviewed [place] PMP aware Continue CPAP   At risk for prolonged QT syndrome-patient has upcoming cardiology appointment.   Patient to get EKG completed.   I have reviewed notes per oncology-Ms.[place] -dated 05/15/2022-'.  Patient with unintentional weight loss, new night sweats and palpable enlarged lymph nodes, PET scan has been ordered."  Follow-up in clinic in 3 to 4 weeks or sooner if needed.   Collaboration of Care: Collaboration of Care: Referral or follow-up with counselor/therapist AEB encouraged to follow up with therapist.  Patient/Guardian was advised Release of Information must be obtained prior to any record release in order to collaborate their care with an outside provider. Patient/Guardian was advised if they have not already done so to contact the registration department to sign all necessary forms in order for [place] to release information regarding their care.   Consent: Patient/Guardian gives verbal consent for treatment and assignment of benefits for services provided during this visit. Patient/Guardian expressed understanding and agreed to proceed.   This note was generated in part or whole  with voice recognition software. Voice recognition is usually quite accurate but there are transcription errors that can and very often do occur. I apologize for any typographical errors that were not detected and corrected.      [name], MD 05/22/2022, 9:51 AM

## 2022-05-22 ENCOUNTER — Other Ambulatory Visit: Payer: Self-pay

## 2022-05-24 ENCOUNTER — Other Ambulatory Visit: Payer: Self-pay | Admitting: Obstetrics and Gynecology

## 2022-05-24 NOTE — Patient Outreach (Signed)
Medicaid Managed Care   Nurse Care Manager Note  05/24/2022 Name:  Alison [name] MRN:  [id number] DOB:  1976-07-09  Alison [name] is an 46 y.o. year old female who is a primary patient of Bo Merino, FNP.  The [facility] Managed Care Coordination team was consulted for assistance with:    Chronic healthcare management needs, DM, chronic pain, OSD, migraines, anxiety/depression/PTSD. Bipolar, h/o lymphoma, fibromyalgia, kidney transplant  [name] was given information about Medicaid Managed Care Coordination team services today. [name] Patient agreed to services and verbal consent obtained.  Engaged with patient by telephone for follow up visit in response to provider referral for case management and/or care coordination services.   Assessments/Interventions:  Review of past medical history, allergies, medications, health status, including review of consultants reports, laboratory and other test data, was performed as part of comprehensive evaluation and provision of chronic care management services.  SDOH (Social Determinants of Health) assessments and interventions performed: SDOH Interventions    Flowsheet Row Most Recent Value  SDOH Interventions   Stress Interventions Provide Counseling  [patient sees Psychiatrist]       Care Plan  Allergies  Allergen Reactions   Penicillin G Itching, Rash, Anaphylaxis and Palpitations   Lamictal [Lamotrigine] Rash   Carbamazepine Other (See Comments)    Medication interaction-prograf    Hydrocodone-Acetaminophen Itching   Naproxen Itching   Penicillins     Medications Reviewed Today     Reviewed by [name], RN (Registered Nurse) on 05/24/22 at 1201  Med List Status: <None>   Medication Order Taking? Sig Documenting Provider Last Dose Status Informant  baclofen (LIORESAL) 10 MG tablet [id number] No Take 10 mg by mouth 2 (two) times daily. [provider] Taking Active Self  benztropine  (COGENTIN) 1 MG tablet [id number]  Take 1 tablet (1 mg total) by mouth daily as needed for tremors. [name], MD  Active   Cholecalciferol (VITAMIN D) 125 MCG (5000 UT) CAPS [id number] No Take 5,000 Units by mouth daily. [provider] Taking Active Self  CRANBERRY PO [id number] No Take 1 capsule by mouth in the morning and at bedtime. [provider] Taking Active Self  Dulaglutide 4.5 MG/0.5ML SOPN [id number] No Inject 4.5 mg into the skin once a week. [provider] Taking Active   eszopiclone (LUNESTA) 2 MG TABS tablet [id number] No TAKE ONE TABLET BY MOUTH EVERYDAY AT BEDTIME AS NEEDED FOR SLEEP. take immediately BEFORE bedtime [name], [name], MD Taking Active   ezetimibe (ZETIA) 10 MG tablet [id number] No Take 10 mg by mouth every morning. [provider] Taking Active Self  fluticasone (FLONASE) 50 MCG/ACT nasal spray [id number] No Place 1 spray into both nostrils daily as needed for allergies. [provider] Taking Active Self  icosapent Ethyl (VASCEPA) 1 g capsule [id number] No Take 2 capsules (2 g total) by mouth 2 (two) times daily. [name], MD Taking Active Self           Med Note Jodi Mourning, [name]   Tue Mar 19, 2022 10:24 AM) Taking 1 capsule twice daily per Dr. Ronnald Collum  lisinopril (ZESTRIL) 20 MG tablet [id number] No Take 1 tablet (20 mg total) by mouth daily. [name], PA-C Taking Active Self  lurasidone (LATUDA) 20 MG TABS tablet [id number]  Take 1 tablet (20 mg total) by mouth daily with supper. [name], MD  Active   Magnesium 500 MG CAPS [id number] No Take 500 mg by mouth daily. [provider]  Taking Active Self  Melatonin 10 MG TABS [id number] No Take 10 mg by mouth at bedtime. [provider] Taking Active Self  metFORMIN (GLUCOPHAGE) 1000 MG tablet [id number] No TAKE ONE TABLET BY MOUTH TWICE DAILY with meals Bo Merino, FNP Taking Active Self  MYRBETRIQ 50 MG TB24 tablet [id number] No  TAKE ONE TABLET BY MOUTH ONCE DAILY Vaillancourt, Samantha, PA-C Taking Active   naloxone (NARCAN) nasal spray 4 mg/0.1 mL [id number] No Place 0.4 mg into the nose once. [provider] Taking Active Self  oxyCODONE ER [facility] ER) 13.5 MG C12A [id number] No Take 13.5 mg by mouth 2 (two) times daily. [provider] Taking Active Self  pregabalin (LYRICA) 200 MG capsule [id number] No Take 1 capsule (200 mg total) by mouth 2 (two) times daily. [name], MD Taking Active   rosuvastatin (CRESTOR) 10 MG tablet [id number] No Take 1 tablet (10 mg total) by mouth daily. [name], MD Taking Active   tacrolimus (PROGRAF) 1 MG capsule [id number] No Take by mouth. [provider] Taking Active   UBRELVY 50 MG TABS [id number] No Take 1 tablet by mouth daily as needed. [provider] Taking Active   venlafaxine XR (EFFEXOR XR) 37.5 MG 24 hr capsule [id number]  Take 1 capsule (37.5 mg total) by mouth daily with breakfast. Take along with 75 mg daily [name], [name], MD  Active   venlafaxine XR (EFFEXOR XR) 75 MG 24 hr capsule [id number]  Take 1 capsule (75 mg total) by mouth daily with breakfast. [name], MD  Active   XTAMPZA ER 9 MG C12A [id number] No Take 1 capsule by mouth every 12 (twelve) hours. TAKES 13.5 MG DAILY [provider] Taking Active   XYLIMELTS 550 MG DISK [id number] No Take 550 mg by mouth every 8 (eight) hours as needed (dry mouth). [provider] Taking Active Self            Patient Active Problem List   Diagnosis Date Noted   Encounter for screening colonoscopy    Menorrhagia with irregular cycle    Akathisia 10/24/2021   At risk for prolonged QT interval syndrome 09/12/2021   Prolonged Q-T interval on ECG 08/29/2021   Insomnia 07/20/2021   Bipolar disorder, in full remission, most recent episode mixed ([place]) 07/20/2021   Bipolar 1 disorder, mixed, mild ([place]) 05/09/2021   GAD (generalized anxiety disorder)  05/09/2021   Hepatic cirrhosis ([place])    Opioid dependence with opioid-induced disorder ([place]) 12/20/2020   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/20/2020   Bipolar disorder, in full remission, most recent episode depressed ([place]) 09/27/2020   Suprapubic pain 07/25/2020   Bipolar 1 disorder, depressed, mild ([place]) 07/10/2020   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   Edema of lower extremity 09/16/2019   Carpal tunnel syndrome, left 07/26/2019   Cubital tunnel syndrome on left 07/26/2019   Bipolar I disorder, most recent episode depressed ([place]) 07/12/2019   PTSD (post-traumatic stress disorder) 94/70/9628   Umbilical hernia without obstruction and without gangrene 06/17/2019   Encounter for surveillance of abnormal nevi 06/17/2019   Pineal gland cyst 06/17/2019   Chronic hip pain, left 06/02/2019   Lumbar spondylosis 06/02/2019   Mouth dryness 06/02/2019   Obesity (BMI 35.0-39.9 without comorbidity) 06/02/2019   Goals of care, counseling/discussion 05/18/2019   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[place]) 05/18/2019   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[place]) 05/18/2019   Occipital neuralgia 05/13/2019   Plantar fasciitis of left foot 04/27/2019  Fibromyalgia 03/23/2019   Systemic lupus erythematosus (SLE) in adult [facility]) 03/23/2019   Essential hypertension 03/23/2019   Hepatitis 03/23/2019   Mass of left kidney 03/23/2019   Diabetes mellitus ([place]) 03/23/2019   Nausea without vomiting 03/23/2019   Neuropathy 03/23/2019   Cancer ([place]) 03/23/2019   Hyperlipidemia 03/23/2019   Osteoarthritis 03/23/2019   Trigeminal neuralgia 03/23/2019   Chronic renal disease, stage III ([place]) 03/23/2019   Nephrolithiasis 03/23/2019   Migraine 03/23/2019   OSA on CPAP 03/23/2019   Fibrocystic breast 03/23/2019   Heart murmur 03/23/2019   Bipolar disorder, in partial remission, most recent episode depressed ([place]) 07/02/2017    Abnormal uterine bleeding 03/18/2017   Major depressive disorder, recurrent ([place]) 03/18/2017   Morbid obesity ([place]) 03/18/2017   Secondary hyperparathyroidism ([place]) 02/07/2017   Neuropathic pain 11/19/2016   Post herpetic neuralgia 11/19/2016   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[place]) 01/13/2015   Lumbar disc disease with radiculopathy 07/26/2013   S/P liver transplant ([place]) 07/26/2013   Type 2 diabetes mellitus, uncontrolled 07/26/2013   Facial nerve disorder 06/29/2012   Low back pain 12/26/2010   Conditions to be addressed/monitored per PCP order:  Chronic healthcare management needs, DM, chronic pain, OSD, migraines, anxiety/depression/PTSD. Bipolar, h/o lymphoma, fibromyalgia, kidney  and liver transplant, LDD, SLE  Care Plan : Chronic Pain (Adult)  Updates made by [name], RN since 05/24/2022 12:00 AM     Problem: Chronic Pain Management-fibromyalgia   Priority: High  Onset Date: 01/22/2021     Long-Range Goal: Fibromyalgia pain managed-new pain management provider   Start Date: 08/14/2020  Expected End Date: 07/24/2022  Recent Progress: On track  Priority: High  Note:   Current Barriers:  Knowledge deficits related to pain management  05/24/22:  Patient states her pain is improved right now.  Nurse Case Manager Clinical Goal(s):  Over the next 45 days, patient will work with [facility] to address needs related to referral for pain management provider and associated care coordination needs. 05/24/22:  patient is currently seeing Dr. Humphrey Rolls at [facility].  Interventions:  Inter-disciplinary care team collaboration (see longitudinal plan of care) Evaluation of current treatment plan related to fibromyalgia  and patient's adherence to plan as established by provider. Update 06/15/21:  Patient taking Baclofen again, Effexor added. Collaborated with primary care provider regarding recommendations and referral to pain management provider. Discussed plans  with patient for ongoing care management follow up and provided patient with direct contact information for care management team Anticipate pain education program, pain management support as part of pain management referral.       Patient Goals/Self-Care Activities Over the next 45 days, patient will:  -Attends all scheduled provider appointments  develop a personal pain management plan with your pain management provider.  Follow Up Plan:  The Managed Medicaid care management team will reach out to the patient again over the next 30  business days   Care Plan : Wellness (Adult)  Updates made by [name], RN since 05/24/2022 12:00 AM     Problem: Medication Adherence (Wellness)   Priority: High  Onset Date: 01/22/2021     Long-Range Goal: Medication Adherence Maintained   Start Date: 09/06/2020  Expected End Date: 08/24/2022  Recent Progress: On track  Priority: High  Note:   Current Barriers:  Chronic Disease Management of chronic health conditions  05/24/22:  Patient concerned today with possible return of lymphoma, patient with weight loss, decreased appetite,  night sweats and  enlarged lymph nodes left and right axilla.  MRI and PET scan scheduled for 05/29/22.  Will see Dr. Janese Banks 05/31/22.  Blood sugars 130s.  Pain controlled and continues to see Psychiatry.  To see CARDS 9/15.  Nurse Case Manager Clinical Goal(s):  Over the next 30 days, patient will work with CM team pharmacist to review current medications. Update 01/22/21:  Patient met with Pharmacist and continues to follow. Over the next 30 days, patient will meet with Nutritionist and check her blood sugars. Over the next 30 days, patient will attend all scheduled appointments.  Interventions:  Inter-disciplinary care team collaboration (see longitudinal plan of care) Evaluation of current treatment plan and patient's adherence to plan as established by provider. Advised patient to contact her PCP for any medication  needs. Reviewed medications with patient. Collaborated with pharmacy regarding medications.  Discussed plans with patient for ongoing care management follow up and provided patient with direct contact information for care management team Pharmacy referral for medication review. Patient given phone number for [facility] transportation if needed. Will notify PCP of need for testing strips and dietician referral. Collaborated with PCP for cardiology appointment. Collaborated with Care Guide for dental resources. Care guide referral for dental resources.-completed Emotional support provided to patient regarding possible lymphoma diagnosis.  Patient Goals/Self-Care Activities Over the next 30 days, patient will:  -Patient will take medications as prescribed. Calls pharmacy for medication refills Calls provider office for new concerns or questions  Follow Up Plan: The Managed Medicaid care management team will reach out to the patient again over the next 30 business days.  The patient has been provided with contact information for the Managed Medicaid care management team and has been advised to call with any health related questions or concerns.    Follow Up:  Patient agrees to Care Plan and Follow-up.  Plan: The Managed Medicaid care management team will reach out to the patient again over the next 30 business  days. and The  Patient has been provided with contact information for the Managed Medicaid care management team and has been advised to call with any health related questions or concerns.  Date/time of next scheduled RN care management/care coordination outreach: 07/04/22 at 1230.

## 2022-05-24 NOTE — Patient Instructions (Signed)
Hi Ms. Kneisley, thank you for speaking with me today.  Ms. Nikkel was given information about Medicaid Managed Care team care coordination services as a part of their Healthy [facility] Medicaid benefit. [name] verbally consented to engagement with the [facility] Managed Care team.   If you are experiencing a medical emergency, please call 911 or report to your local emergency department or urgent care.   If you have a non-emergency medical problem during routine business hours, please contact your provider's office and ask to speak with a nurse.   For questions related to your Healthy [facility] health plan, please call: [phone] or visit the homepage here: [email or web address]  If you would like to schedule transportation through your Healthy [facility] plan, please call the following number at least 2 days in advance of your appointment: [phone]  For information about your ride after you set it up, call Ride Assist at [phone]. Use this number to activate a Will Call pickup, or if your transportation is late for a scheduled pickup. Use this number, too, if you need to make a change or cancel a previously scheduled reservation.  If you need transportation services right away, call [phone]. The after-hours call center is staffed 24 hours to handle ride assistance and urgent reservation requests (including discharges) 365 days a year. Urgent trips include sick visits, hospital discharge requests and life-sustaining treatment.  Call the [place] at [phone], at any time, 24 hours a day, 7 days a week. If you are in danger or need immediate medical attention call 911.  If you would like help to quit smoking, call 1-800-QUIT-NOW 740-011-3374) OR Espaol: 1-855-Djelo-Ya (6-283-662-9476) o para ms informacin haga clic aqu or Text READY to 200-400 to register via text  Ms. Zender - following are the goals we  discussed in your visit today:   Goals Addressed             This Visit's Progress    Chronic Pain Managed       05/24/22:  patient states her pain is improved right now-followed by Dr. Humphrey Rolls for pain management      Get sugars at goal       Timeframe:  Short-Term Goal Priority:  High Start Date:                             Expected End Date:      ongoing                 Follow Up Date: 07/04/22   - call for medicine refill 2 or 3 days before it runs out - call if I am sick and can't take my medicine - keep a list of all the medicines I take; vitamins and herbals too    Why is this important?   These steps will help you keep on track with your medicines.   05/24/22:  patient checks blood sugar once a day, 130s   Patient verbalizes understanding of instructions and care plan provided today and agrees to view in [place]. Active MyChart status and patient understanding of how to access instructions and care plan via MyChart confirmed with patient.     The Managed Medicaid care management team will reach out to the patient again over the next 30 business  days.  The  Patient has been provided with contact information for the Managed Medicaid care management team and has been advised to  call with any health related questions or concerns.   [name], [name] [place] Management Coordinator - Managed Medicaid High Risk [phone]  Following is a copy of your plan of care:  Care Plan : Chronic Pain (Adult)  Updates made by [name], RN since 05/24/2022 12:00 AM     Problem: Chronic Pain Management-fibromyalgia   Priority: High  Onset Date: 01/22/2021     Long-Range Goal: Fibromyalgia pain managed-new pain management provider   Start Date: 08/14/2020  Expected End Date: 07/24/2022  Recent Progress: On track  Priority: High  Note:   Current Barriers:  Knowledge deficits related to pain management  05/24/22:  Patient states her pain is  improved right now.  Nurse Case Manager Clinical Goal(s):  Over the next 45 days, patient will work with [facility] to address needs related to referral for pain management provider and associated care coordination needs. 05/24/22:  patient is currently seeing Dr. Humphrey Rolls at [facility].  Interventions:  Inter-disciplinary care team collaboration (see longitudinal plan of care) Evaluation of current treatment plan related to fibromyalgia  and patient's adherence to plan as established by provider. Update 06/15/21:  Patient taking Baclofen again, Effexor added. Collaborated with primary care provider regarding recommendations and referral to pain management provider. Discussed plans with patient for ongoing care management follow up and provided patient with direct contact information for care management team Anticipate pain education program, pain management support as part of pain management referral.       Patient Goals/Self-Care Activities Over the next 45 days, patient will:  -Attends all scheduled provider appointments  develop a personal pain management plan with your pain management provider.  Follow Up Plan:  The Managed Medicaid care management team will reach out to the patient again over the next 30  business days   Care Plan : Wellness (Adult)  Updates made by [name], RN since 05/24/2022 12:00 AM     Problem: Medication Adherence (Wellness)   Priority: High  Onset Date: 01/22/2021     Long-Range Goal: Medication Adherence Maintained   Start Date: 09/06/2020  Expected End Date: 08/24/2022  Recent Progress: On track  Priority: High  Note:   Current Barriers:  Chronic Disease Management of chronic health conditions  05/24/22:  Patient concerned today with possible return of lymphoma, patient with weight loss, decreased appetite,  night sweats and enlarged lymph nodes left and right axilla.  MRI and PET scan scheduled for 05/29/22.  Will see Dr. Janese Banks  05/31/22.  Blood sugars 130s.  Pain controlled and continues to see Psychiatry.  To see CARDS 9/15.  Nurse Case Manager Clinical Goal(s):  Over the next 30 days, patient will work with CM team pharmacist to review current medications. Update 01/22/21:  Patient met with Pharmacist and continues to follow. Over the next 30 days, patient will meet with Nutritionist and check her blood sugars. Over the next 30 days, patient will attend all scheduled appointments.  Interventions:  Inter-disciplinary care team collaboration (see longitudinal plan of care) Evaluation of current treatment plan and patient's adherence to plan as established by provider. Advised patient to contact her PCP for any medication needs. Reviewed medications with patient. Collaborated with pharmacy regarding medications.  Discussed plans with patient for ongoing care management follow up and provided patient with direct contact information for care management team Pharmacy referral for medication review. Patient given phone number for [facility] transportation if needed.  Will notify PCP of need for testing strips and dietician referral. Collaborated with PCP for cardiology appointment. Collaborated with Care Guide for dental resources. Care guide referral for dental resources.-completed Emotional support provided to patient regarding possible lymphoma diagnosis.  Patient Goals/Self-Care Activities Over the next 30 days, patient will:  -Patient will take medications as prescribed. Calls pharmacy for medication refills Calls provider office for new concerns or questions  Follow Up Plan: The Managed Medicaid care management team will reach out to the patient again over the next 30 business days.  The patient has been provided with contact information for the Managed Medicaid care management team and has been advised to call with any health related questions or concerns.

## 2022-05-27 ENCOUNTER — Ambulatory Visit (INDEPENDENT_AMBULATORY_CARE_PROVIDER_SITE_OTHER): Payer: Medicaid Other | Admitting: Nurse Practitioner

## 2022-05-27 ENCOUNTER — Encounter: Payer: Self-pay | Admitting: Nurse Practitioner

## 2022-05-27 ENCOUNTER — Other Ambulatory Visit: Payer: Self-pay

## 2022-05-27 VITALS — BP 124/78 | HR 89 | Temp 98.7°F | Resp 18 | Ht 66.5 in | Wt 236.0 lb

## 2022-05-27 DIAGNOSIS — F3178 Bipolar disorder, in full remission, most recent episode mixed: Secondary | ICD-10-CM | POA: Diagnosis not present

## 2022-05-27 DIAGNOSIS — M329 Systemic lupus erythematosus, unspecified: Secondary | ICD-10-CM

## 2022-05-27 DIAGNOSIS — F411 Generalized anxiety disorder: Secondary | ICD-10-CM | POA: Diagnosis not present

## 2022-05-27 DIAGNOSIS — E785 Hyperlipidemia, unspecified: Secondary | ICD-10-CM | POA: Diagnosis not present

## 2022-05-27 DIAGNOSIS — M3500 Sicca syndrome, unspecified: Secondary | ICD-10-CM

## 2022-05-27 DIAGNOSIS — E669 Obesity, unspecified: Secondary | ICD-10-CM

## 2022-05-27 DIAGNOSIS — I10 Essential (primary) hypertension: Secondary | ICD-10-CM

## 2022-05-27 DIAGNOSIS — G2111 Neuroleptic induced parkinsonism: Secondary | ICD-10-CM

## 2022-05-27 DIAGNOSIS — C8580 Other specified types of non-Hodgkin lymphoma, unspecified site: Secondary | ICD-10-CM

## 2022-05-27 DIAGNOSIS — G4733 Obstructive sleep apnea (adult) (pediatric): Secondary | ICD-10-CM | POA: Diagnosis not present

## 2022-05-27 DIAGNOSIS — Z9989 Dependence on other enabling machines and devices: Secondary | ICD-10-CM

## 2022-05-27 DIAGNOSIS — Z23 Encounter for immunization: Secondary | ICD-10-CM

## 2022-05-27 DIAGNOSIS — E1165 Type 2 diabetes mellitus with hyperglycemia: Secondary | ICD-10-CM | POA: Diagnosis not present

## 2022-05-27 DIAGNOSIS — F3176 Bipolar disorder, in full remission, most recent episode depressed: Secondary | ICD-10-CM

## 2022-05-27 DIAGNOSIS — M797 Fibromyalgia: Secondary | ICD-10-CM | POA: Diagnosis not present

## 2022-05-27 DIAGNOSIS — G43109 Migraine with aura, not intractable, without status migrainosus: Secondary | ICD-10-CM

## 2022-05-27 DIAGNOSIS — G500 Trigeminal neuralgia: Secondary | ICD-10-CM | POA: Diagnosis not present

## 2022-05-27 DIAGNOSIS — C8330 Diffuse large B-cell lymphoma, unspecified site: Secondary | ICD-10-CM | POA: Diagnosis not present

## 2022-05-27 NOTE — Assessment & Plan Note (Signed)
Continue to follow-up with Dr. Ronnald Collum.  Her last A1c was 6.4 in May.  She has upcoming appointment.  Continue taking Trulicity 4.5 mg weekly and metformin 1000 mg twice daily.

## 2022-05-27 NOTE — Assessment & Plan Note (Signed)
Currently under the care of Dr. Manuella Ghazi neurology.  She last saw neurology on 05/15/2022.  She currently takes Lyrica 200 mg 2 times a day and baclofen 10 mg 2 times a day for trigeminal neuralgia.  Patient states that when she does have a migraine she uses [place].  Patient reports her last migraine was three weeks ago.  She has had a concerned that she has Parkinson's disease neurology is working her up.  They have ordered an MRI which is scheduled for 05/29/2022.

## 2022-05-27 NOTE — Progress Notes (Signed)
BP 124/78   Pulse 89   Temp 98.7 F (37.1 C) (Oral)   Resp 18   Ht 5' 6.5" (1.689 m)   Wt 236 lb (107 kg)   SpO2 95%   BMI 37.52 kg/m    Subjective:    Patient ID: Alison [name], female    DOB: 09-30-1976, 46 y.o.   MRN: [id number]  HPI: Alison [name] is a 46 y.o. female  Chief Complaint  Patient presents with   Hypertension   Hyperlipidemia   Diabetes   Hypertension: Her blood pressure today is 124/78.  She denies any shortness of breath, chest pain, headaches or blurred vision.  She is currently taking lisinopril 20 mg daily.   Diabetes: He says her blood sugar has been running 115-130.  Last A1c was 6.4 in May. She is currently taking Trulicity 4.5 mg weekly, and metformin 1000 mg twice daily.  She is currently under the care of Dr. Carmela Rima for her diabetes.  She is up-to-date on her eye and foot exams.  Hyperlipidemia: Her last LDL was 101 on 12/03/2021.  She is currently taking rosuvastatin 10 mg daily and Zetia 10 mg daily.  She denies any myalgia.  Migraines/trigeminal neuralgia/fibromyalgia: Currently under the care of Dr. Manuella Ghazi neurology.  She last saw neurology on 05/15/2022.  She currently takes Lyrica 200 mg 2 times a day and baclofen 10 mg 2 times a day for trigeminal neuralgia.  Patient states that when she does have a migraine she uses [place].  Patient reports her last migraine was three weeks ago.  She has had a concerned that she has Parkinson's disease neurology is working her up.  They have ordered an MRI which is scheduled for 05/29/2022.   Sleep apnea/ cpap: Patient reports that she does use her CPAP machine as much as possible, sometimes she feels like she gets tangled up but she tries to use it as much as possible.  Her last sleep study was done at [facility] sleep medicine on 08/27/2021.   Lymphoma: Currently sees Dr. Janese Banks in oncology.  Last seen on 05/15/2022.  She recently found some swollen lymph nodes under both arm.  Her oncologist ordered a PET scan. She  is scheduled for a PET scan on 05/29/2022.  Obesity: Her weight today is 236 pounds with a BMI of 37.52.  It is important to get physical activity at least 150 minutes a week.  Working on portion control and diet can also be helpful for overall health.  Bipolar/insomnia/anxiety: Currently sees Dr. Shea Evans and [facility]  She currently takes Effexor 112.5 mg daily, Latuda 20 mg daily and she also takes Cogentin 1 mg p.o. daily.  She also takes [place] for insomnia.  She last saw Dr. Shea Evans on 05/21/2022. Her effexor was recently increased.  She says she has started to notice a difference and she sees Dr. Shea Evans again next month.      05/27/2022   11:02 AM 05/21/2022    4:20 PM 03/18/2022    2:08 PM 02/14/2022    1:40 PM 01/25/2022    1:50 PM  Depression screen PHQ 2/9  Decreased Interest 3 2 0 0 2  Down, Depressed, Hopeless 3 3 0 1 2  PHQ - 2 Score 6 5 0 1 4  Altered sleeping 2 2   1   Tired, decreased energy 3 3   1   Change in appetite 3 3   1   Feeling bad or failure about yourself  3 2   0  Trouble concentrating 3 2   1   Moving slowly or fidgety/restless 3 0   0  Suicidal thoughts 0 0   0  PHQ-9 Score 23 17   8   Difficult doing work/chores Very difficult Very difficult   Somewhat difficult       05/27/2022   11:06 AM 03/18/2022    2:10 PM 02/14/2022    1:39 PM 01/25/2022    1:50 PM  GAD 7 : Generalized Anxiety Score  Nervous, Anxious, on Edge 3 1 2 1   Control/stop worrying 3 0 2 1  Worry too much - different things 2 0 2 1  Trouble relaxing 3 1 3 1   Restless 3 0 1 1  Easily annoyed or irritable 3 0 0 1  Afraid - awful might happen 3 1 0 1  Total GAD 7 Score 20 3 10 7   Anxiety Difficulty Very difficult Not difficult at all Not difficult at all Somewhat difficult   Sjogren's syndrome:  She says she has been having a lot of dry mouth lately. Otherwise she has been doing fine. Discussed drinking water at routine intervals, avoiding irritants like coffee, smoking  and alcohol.   Lupus: She says she has not had any flares, she stays out of the sun to avoid triggering a flare.     Relevant past medical, surgical, family and social history reviewed and updated as indicated. Interim medical history since our last visit reviewed. Allergies and medications reviewed and updated.  Review of Systems  Constitutional: Negative for fever or weight change.  Respiratory: Negative for cough and shortness of breath.   Cardiovascular: Negative for chest pain or palpitations.  Gastrointestinal: Negative for abdominal pain, no bowel changes.  Musculoskeletal: Negative for gait problem or joint swelling.  Skin: Negative for rash.  Neurological: Negative for dizziness or headache.  No other specific complaints in a complete review of systems (except as listed in HPI above).      Objective:    BP 124/78   Pulse 89   Temp 98.7 F (37.1 C) (Oral)   Resp 18   Ht 5' 6.5" (1.689 m)   Wt 236 lb (107 kg)   SpO2 95%   BMI 37.52 kg/m   Wt Readings from Last 3 Encounters:  05/27/22 236 lb (107 kg)  05/15/22 239 lb 9.6 oz (108.7 kg)  02/28/22 243 lb (110.2 kg)    Physical Exam  Constitutional: Patient appears well-developed and well-nourished. Obese  No distress.  HEENT: head atraumatic, normocephalic, pupils equal and reactive to light,  neck supple Cardiovascular: Normal rate, regular rhythm and normal heart sounds.  No murmur heard. No BLE edema. Pulmonary/Chest: Effort normal and breath sounds normal. No respiratory distress. Abdominal: Soft.  There is no tenderness. Psychiatric: Patient has a normal mood and affect. behavior is normal. Judgment and thought content normal.  Results for orders placed or performed in visit on 05/15/22  Lactate dehydrogenase  Result Value Ref Range   LDH 112 98 - 192 U/L  Comprehensive metabolic panel  Result Value Ref Range   Sodium 139 135 - 145 mmol/L   Potassium 4.3 3.5 - 5.1 mmol/L   Chloride 103 98 - 111 mmol/L    CO2 26 22 - 32 mmol/L   Glucose, Bld 159 (H) 70 - 99 mg/dL   BUN 26 (H) 6 - 20 mg/dL   Creatinine, Ser 1.18 (H) 0.44 - 1.00 mg/dL   Calcium 9.4 8.9 - 10.3 mg/dL  Total Protein 7.0 6.5 - 8.1 g/dL   Albumin 3.9 3.5 - 5.0 g/dL   AST 22 15 - 41 U/L   ALT 17 0 - 44 U/L   Alkaline Phosphatase 45 38 - 126 U/L   Total Bilirubin 0.6 0.3 - 1.2 mg/dL   GFR, Estimated 57 (L) >60 mL/min   Anion gap 10 5 - 15  CBC with Differential/Platelet  Result Value Ref Range   WBC 7.2 4.0 - 10.5 K/uL   RBC 4.70 3.87 - 5.11 MIL/uL   Hemoglobin 13.6 12.0 - 15.0 g/dL   HCT 41.3 36.0 - 46.0 %   MCV 87.9 80.0 - 100.0 fL   MCH 28.9 26.0 - 34.0 pg   MCHC 32.9 30.0 - 36.0 g/dL   RDW 13.2 11.5 - 15.5 %   Platelets 120 (L) 150 - 400 K/uL   nRBC 0.0 0.0 - 0.2 %   Neutrophils Relative % 53 %   Neutro Abs 3.8 1.7 - 7.7 K/uL   Lymphocytes Relative 32 %   Lymphs Abs 2.3 0.7 - 4.0 K/uL   Monocytes Relative 8 %   Monocytes Absolute 0.6 0.1 - 1.0 K/uL   Eosinophils Relative 6 %   Eosinophils Absolute 0.5 0.0 - 0.5 K/uL   Basophils Relative 1 %   Basophils Absolute 0.1 0.0 - 0.1 K/uL   Immature Granulocytes 0 %   Abs Immature Granulocytes 0.01 0.00 - 0.07 K/uL      Assessment & Plan:   Problem List Items Addressed This Visit       Cardiovascular and Mediastinum   Essential hypertension - Primary    Continue taking lisinopril 20 mg daily.  Pressure at goal 124/78 today.      Relevant Orders   CBC with Differential/Platelet   COMPLETE METABOLIC PANEL WITH GFR   Migraine    Continue to follow up with Dr. Manuella Ghazi.  She currently takes [place] when she has a migraine.  She also has an MRI scheduled for 05/29/2022.        Respiratory   OSA on CPAP    Continue using CPAP machine as much as possible.        Endocrine   Diabetes mellitus ([place])    Continue to follow-up with Dr. Ronnald Collum.  Her last A1c was 6.4 in May.  She has upcoming appointment.  Continue taking Trulicity 4.5 mg weekly and metformin 1000  mg twice daily.      Relevant Orders   COMPLETE METABOLIC PANEL WITH GFR     Nervous and Auditory   Trigeminal neuralgia    Currently under the care of Dr. Manuella Ghazi neurology.  She last saw neurology on 05/15/2022.  She currently takes Lyrica 200 mg 2 times a day and baclofen 10 mg 2 times a day for trigeminal neuralgia.  Patient states that when she does have a migraine she uses [place].  Patient reports her last migraine was three weeks ago.  She has had a concerned that she has Parkinson's disease neurology is working her up.  They have ordered an MRI which is scheduled for 05/29/2022.        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 Manuella Ghazi neurology.  She last saw neurology on 05/15/2022.  She currently takes Lyrica 200 mg 2 times a day and baclofen 10 mg 2 times a day for trigeminal neuralgia.  Patient states that when she does have a migraine she uses [place].  Patient reports her  last migraine was three weeks ago.  She has had a concerned that she has Parkinson's disease neurology is working her up.  They have ordered an MRI which is scheduled for 05/29/2022.          Other   Fibromyalgia    Currently under the care of Dr. Manuella Ghazi neurology.  She last saw neurology on 05/15/2022.  She currently takes Lyrica 200 mg 2 times a day and baclofen 10 mg 2 times a day for trigeminal neuralgia.  Patient states that when she does have a migraine she uses [place].  Patient reports her last migraine was three weeks ago.  She has had a concerned that she has Parkinson's disease neurology is working her up.  They have ordered an MRI which is scheduled for 05/29/2022.        Systemic lupus erythematosus (SLE) in adult [facility])   Hyperlipidemia    Continue taking rosuvastatin 10 mg daily and Zetia 10 mg daily.  Get labs today.      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 Janese Banks in oncology.  Last seen  05/15/2022.  It was noted that there was swollen lymph nodes under both arms.  Oncologist ordered a PET scan.  Patient is scheduled for PET scan on 05/29/2022.      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HCC)   Obesity (BMI 35.0-39.9 without comorbidity)    His current weight 236 pounds with a BMI of 37.52.  Recommend increasing physical activity as tolerated to 150 minutes a week.      Bipolar disorder, in full remission, most recent episode depressed ([place])    Continue to follow-up with Dr. Shea Evans.  Effexor recently increased.  Patient reports she has started to notice a difference.  Also continue taking Latuda 20 mg daily and Cogentin 1 mg p.o. daily.      GAD (generalized anxiety disorder)    Continue to follow-up with Dr. Shea Evans.  Effexor recently increased.  Patient reports she has started to notice a difference.      Bipolar disorder, in full remission, most recent episode mixed ([place])    Continue to follow-up with Dr. Shea Evans.  Effexor recently increased.  Patient reports she has started to notice a difference.      Sjogren's syndrome ([place])    Drink water at routine intervals.  Avoid irritants such as coffee, tobacco and alcohol.      Other Visit Diagnoses     Need for influenza vaccination       Relevant Orders   Flu Vaccine QUAD 6+ mos PF IM (Fluarix Quad PF) (Completed)        Follow up plan: Return in about 6 months (around 11/27/2022) for follow up.

## 2022-05-27 NOTE — Assessment & Plan Note (Signed)
Continue to follow-up with Dr. Shea Evans.  Effexor recently increased.  Patient reports she has started to notice a difference.

## 2022-05-27 NOTE — Assessment & Plan Note (Signed)
Continue to follow up with Dr. Manuella Ghazi.  She currently takes [place] when she has a migraine.  She also has an MRI scheduled for 05/29/2022.

## 2022-05-27 NOTE — Assessment & Plan Note (Signed)
Patient sees Dr. Janese Banks in oncology.  Last seen 05/15/2022.  It was noted that there was swollen lymph nodes under both arms.  Oncologist ordered a PET scan.  Patient is scheduled for PET scan on 05/29/2022.

## 2022-05-27 NOTE — Assessment & Plan Note (Signed)
Drink water at routine intervals.  Avoid irritants such as coffee, tobacco and alcohol.

## 2022-05-27 NOTE — Assessment & Plan Note (Signed)
His current weight 236 pounds with a BMI of 37.52.  Recommend increasing physical activity as tolerated to 150 minutes a week.

## 2022-05-27 NOTE — Assessment & Plan Note (Signed)
Continue taking lisinopril 20 mg daily.  Pressure at goal 124/78 today.

## 2022-05-27 NOTE — Assessment & Plan Note (Signed)
Continue using CPAP machine as much as possible.

## 2022-05-27 NOTE — Assessment & Plan Note (Signed)
Continue taking rosuvastatin 10 mg daily and Zetia 10 mg daily.  Get labs today.

## 2022-05-27 NOTE — Assessment & Plan Note (Signed)
Continue to follow-up with Dr. Shea Evans.  Effexor recently increased.  Patient reports she has started to notice a difference.  Also continue taking Latuda 20 mg daily and Cogentin 1 mg p.o. daily.

## 2022-05-28 ENCOUNTER — Other Ambulatory Visit: Payer: Self-pay | Admitting: Nurse Practitioner

## 2022-05-28 ENCOUNTER — Encounter: Payer: Self-pay | Admitting: Nurse Practitioner

## 2022-05-28 ENCOUNTER — Encounter: Payer: Self-pay | Admitting: Oncology

## 2022-05-28 DIAGNOSIS — N184 Chronic kidney disease, stage 4 (severe): Secondary | ICD-10-CM

## 2022-05-28 DIAGNOSIS — M329 Systemic lupus erythematosus, unspecified: Secondary | ICD-10-CM

## 2022-05-28 LAB — LIPID PANEL
Cholesterol: 81 mg/dL (ref <200)
HDL: 35 mg/dL — ABNORMAL LOW (ref >=50)
LDL Cholesterol (Calc): 19 mg/dL (calc)
Non-HDL Cholesterol (Calc): 46 mg/dL (calc) (ref <130)
Total CHOL/HDL Ratio: 2.3 (calc) (ref <5.0)
Triglycerides: 203 mg/dL — ABNORMAL HIGH (ref <150)

## 2022-05-28 LAB — CBC WITH DIFFERENTIAL/PLATELET
Absolute Monocytes: 724 cells/uL (ref 200–950)
Basophils Absolute: 82 cells/uL (ref 0–200)
Basophils Relative: 0.8 %
Eosinophils Absolute: 602 cells/uL — ABNORMAL HIGH (ref 15–500)
Eosinophils Relative: 5.9 %
HCT: 43.6 % (ref 35.0–45.0)
Hemoglobin: 14.4 g/dL (ref 11.7–15.5)
Lymphs Abs: 4253 cells/uL — ABNORMAL HIGH (ref 850–3900)
MCH: 28.8 pg (ref 27.0–33.0)
MCHC: 33 g/dL (ref 32.0–36.0)
MCV: 87.2 fL (ref 80.0–100.0)
MPV: 10.9 fL (ref 7.5–12.5)
Monocytes Relative: 7.1 %
Neutro Abs: 4539 cells/uL (ref 1500–7800)
Neutrophils Relative %: 44.5 %
Platelets: 149 10*3/uL (ref 140–400)
RBC: 5 10*6/uL (ref 3.80–5.10)
RDW: 13.7 % (ref 11.0–15.0)
Total Lymphocyte: 41.7 %
WBC: 10.2 10*3/uL (ref 3.8–10.8)

## 2022-05-28 LAB — COMPLETE METABOLIC PANEL WITH GFR
AG Ratio: 1.8 (calc) (ref 1.0–2.5)
ALT: 17 U/L (ref 6–29)
AST: 21 U/L (ref 10–35)
Albumin: 4.6 g/dL (ref 3.6–5.1)
Alkaline phosphatase (APISO): 57 U/L (ref 31–125)
BUN/Creatinine Ratio: 16 (calc) (ref 6–22)
BUN: 34 mg/dL — ABNORMAL HIGH (ref 7–25)
CO2: 22 mmol/L (ref 20–32)
Calcium: 9.6 mg/dL (ref 8.6–10.2)
Chloride: 102 mmol/L (ref 98–110)
Creat: 2.1 mg/dL — ABNORMAL HIGH (ref 0.50–0.99)
Globulin: 2.6 g/dL (calc) (ref 1.9–3.7)
Glucose, Bld: 119 mg/dL — ABNORMAL HIGH (ref 65–99)
Potassium: 4.9 mmol/L (ref 3.5–5.3)
Sodium: 138 mmol/L (ref 135–146)
Total Bilirubin: 0.7 mg/dL (ref 0.2–1.2)
Total Protein: 7.2 g/dL (ref 6.1–8.1)
eGFR: 29 mL/min/{1.73_m2} — ABNORMAL LOW (ref >=60)

## 2022-05-29 ENCOUNTER — Other Ambulatory Visit: Payer: Self-pay

## 2022-05-29 ENCOUNTER — Ambulatory Visit
Admission: RE | Admit: 2022-05-29 | Discharge: 2022-05-29 | Disposition: A | Discharge status: Home / Self Care | Payer: Medicaid Other | Source: Ambulatory Visit | Attending: Student | Admitting: Student

## 2022-05-29 ENCOUNTER — Ambulatory Visit
Admission: RE | Admit: 2022-05-29 | Discharge: 2022-05-29 | Disposition: A | Discharge status: Home / Self Care | Payer: Medicaid Other | Source: Ambulatory Visit | Attending: Medical Oncology | Admitting: Medical Oncology

## 2022-05-29 DIAGNOSIS — E538 Deficiency of other specified B group vitamins: Secondary | ICD-10-CM

## 2022-05-29 DIAGNOSIS — K754 Autoimmune hepatitis: Secondary | ICD-10-CM | POA: Diagnosis not present

## 2022-05-29 DIAGNOSIS — G500 Trigeminal neuralgia: Secondary | ICD-10-CM | POA: Diagnosis not present

## 2022-05-29 DIAGNOSIS — C8309 Small cell B-cell lymphoma, extranodal and solid organ sites: Secondary | ICD-10-CM | POA: Diagnosis not present

## 2022-05-29 DIAGNOSIS — Z944 Liver transplant status: Secondary | ICD-10-CM | POA: Diagnosis not present

## 2022-05-29 DIAGNOSIS — Z853 Personal history of malignant neoplasm of breast: Secondary | ICD-10-CM | POA: Insufficient documentation

## 2022-05-29 DIAGNOSIS — R251 Tremor, unspecified: Secondary | ICD-10-CM | POA: Diagnosis not present

## 2022-05-29 DIAGNOSIS — C8580 Other specified types of non-Hodgkin lymphoma, unspecified site: Secondary | ICD-10-CM

## 2022-05-29 DIAGNOSIS — C884 Extranodal marginal zone B-cell lymphoma of mucosa-associated lymphoid tissue [MALT-lymphoma]: Secondary | ICD-10-CM | POA: Diagnosis not present

## 2022-05-29 LAB — GLUCOSE, CAPILLARY: Glucose-Capillary: 139 mg/dL — ABNORMAL HIGH (ref 70–99)

## 2022-05-29 MED ORDER — FLUDEOXYGLUCOSE F - 18 (FDG) INJECTION
12.4000 | Freq: Once | INTRAVENOUS | Status: AC | PRN
Start: 2022-05-29 — End: 2022-05-29
  Administered 2022-05-29: 13.38 via INTRAVENOUS

## 2022-05-31 ENCOUNTER — Encounter: Payer: Self-pay | Admitting: Oncology

## 2022-05-31 ENCOUNTER — Inpatient Hospital Stay: Payer: Medicaid Other | Admitting: Oncology

## 2022-05-31 ENCOUNTER — Inpatient Hospital Stay (HOSPITAL_BASED_OUTPATIENT_CLINIC_OR_DEPARTMENT_OTHER): Payer: Medicaid Other | Admitting: Oncology

## 2022-05-31 VITALS — BP 109/70 | HR 65 | Temp 97.8°F | Resp 18 | Wt 237.5 lb

## 2022-05-31 DIAGNOSIS — Z08 Encounter for follow-up examination after completed treatment for malignant neoplasm: Secondary | ICD-10-CM | POA: Diagnosis not present

## 2022-05-31 DIAGNOSIS — D696 Thrombocytopenia, unspecified: Secondary | ICD-10-CM

## 2022-05-31 DIAGNOSIS — C884 Extranodal marginal zone B-cell lymphoma of mucosa-associated lymphoid tissue [MALT-lymphoma]: Secondary | ICD-10-CM | POA: Diagnosis not present

## 2022-05-31 DIAGNOSIS — Z8572 Personal history of non-Hodgkin lymphomas: Secondary | ICD-10-CM | POA: Diagnosis not present

## 2022-05-31 NOTE — Progress Notes (Signed)
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  She is currently on CellCept and tacrolimus for the same.  She also has a history of post transplant lymphoproliferative disorder/DLBCL that was diagnosed in 2016.  She is s/p 6 cycles of R-CHOP chemotherapy back then and was in complete remission 1.  Her other past medical history significant for migraines, stage III chronic kidney disease, chemo-induced peripheral neuropathy, hypertension among other medical problems.  With regards to her diffuse large B-cell lymphoma she was getting surveillance scans and this was all done in [place].  She has now moved to [facility] to be with her significant other.  Patient was noted to have a prior kidney mass before which was biopsied and was not consistent with malignancy.   She underwent CT neck with contrast before she left [place] on 02/22/2019.  She was noted to have soft tissue masses in both the parotid glands which were new as compared to prior exams and possibly represent lymph nodes.  The largest one was seen in the left parotid gland measuring 1.3 x 1.1 cm.   Before she could get a work-up for this patient moved to [place] and has not had any further work-up yet   Her other prior imaging is as follows: MRI thoracic spine without contrast in August 2019 showed moderate multilevel spondylosis but no evidence of lymphoma.  Multinodular thyroid in June 2019.  MRI cervical spine May 2019 again showed spondylosis but no other acute pathology.  MRI brain showed incidental partial opacification of the right and right sinus.  No evidence of malignancy.  I do not have any other PET CT scan or treatment records from the past.   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  Bone marrow biopsy was negative for lymphoma.  Patient completed 4 cycles of weekly RituxanIn September 2020.  Repeat PET CT scan showed interval resolution of the parotid lesions compatible with complete response to therapy    Interval history-patient is concerned about her ongoing weight loss.  Although as compared to February 2023 her weight has not changed.  ECOG PS- 1 Pain scale- 0   Review of systems- Review of Systems  Constitutional:  Positive for malaise/fatigue.      Allergies  Allergen Reactions   Penicillin G Itching, Rash, Anaphylaxis and Palpitations   Lamictal [Lamotrigine] Rash   Carbamazepine Other (See Comments)    Medication interaction-prograf    Hydrocodone-Acetaminophen Itching   Naproxen Itching   Penicillins      Past Medical History:  Diagnosis Date   Abnormal uterine bleeding    Allergy    Anxiety    Arthritis    Bipolar disorder (manic depression) (HCC)    Chronic  kidney failure    Chronic renal disease, stage III (HCC)    Diabetes mellitus without complication ([place])    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[place]) 2015   Right axillary lymph node resected and chemo tx's.   Dyspnea    FH: trigeminal neuralgia    GERD (gastroesophageal reflux disease)    Heart murmur    Hepatic cirrhosis (HCC)     History of kidney stones    Hypertension    Kidney mass    Long Q-T syndrome    Lupoid hepatitis (HCC)    Lupus (HCC)    Major depressive disorder    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[place]) 06/2019   Chemo tx's   Migraine    Morbid obesity (HCC)    Neuromuscular disorder (HCC)    neuropathy   Neuropathy    Personality disorder (HCC)    Pineal gland cyst    Post herpetic neuralgia    PTSD (post-traumatic stress disorder)    Renal disorder    S/P liver transplant ([place]) 1993   Sleep apnea    has not recieved cpap yet-other one was recalled     Past Surgical History:  Procedure Laterality Date   BONE MARROW BIOPSY  01/13/2015   BREAST BIOPSY  12/2014   BREAST BIOPSY  2011   BREAST SURGERY     CHOLECYSTECTOMY     COLONOSCOPY     COLONOSCOPY WITH PROPOFOL N/A 02/28/2022   Procedure: COLONOSCOPY WITH PROPOFOL;  Surgeon: [name], MD;  Location: [facility] ENDOSCOPY;  Service: Endoscopy;  Laterality: N/A;   ESOPHAGOGASTRODUODENOSCOPY     ESOPHAGOGASTRODUODENOSCOPY (EGD) WITH PROPOFOL N/A 01/09/2021   Procedure: ESOPHAGOGASTRODUODENOSCOPY (EGD) WITH PROPOFOL;  Surgeon: [name], MD;  Location: [facility] ENDOSCOPY;  Service: Endoscopy;  Laterality: N/A;   HERNIA REPAIR     x4-all from liver transplant   HYSTEROSCOPY WITH D & C N/A 11/13/2021   Procedure: DILATATION AND CURETTAGE /HYSTEROSCOPY;  Surgeon: Homero Fellers, MD;  Location: [facility] ORS;  Service: Gynecology;  Laterality: N/A;   [place]     LIVER TRANSPLANT  12/17/1991   LUMBAR PUNCTURE     PORT A CATH INJECTION ([place] HX)     RENAL BIOPSY     tumor removal  2015   cancer right side    Social History   Socioeconomic History   Marital status: Single    Spouse name: Not on file   Number of children: 0   Years of education: 9   Highest education level: GED or equivalent  Occupational History   Occupation: disabled  Tobacco Use   Smoking status: Never   Smokeless tobacco: Never  Vaping Use   Vaping Use: Never  used  Substance and Sexual Activity   Alcohol use: Not Currently   Drug use: Not Currently    Comment: pain managment last used in early april   Sexual activity: Not Currently  Other Topics Concern   Not on file  Social History Narrative   Not on file   Social Determinants of Health   Financial Resource Strain: Low Risk  (04/23/2022)   Overall Financial Resource Strain (CARDIA)    Difficulty of Paying Living Expenses: Not very hard  Food Insecurity: No Food Insecurity (03/15/2022)   Hunger Vital Sign    Worried About Running Out of Food in the Last Year: Never true    Ran Out of Food in the Last Year: Never true  Transportation Needs: No Transportation Needs (03/15/2022)   [place] -  Hydrologist (Medical): No    Lack of Transportation (Non-Medical): No  Physical Activity: Sufficiently Active (04/23/2022)   Exercise Vital Sign    Days of Exercise per Week: 3 days    Minutes of Exercise per Session: 150+ min  Stress: Stress Concern Present (05/24/2022)   [place]    Feeling of Stress : Very much  Social Connections: Moderately Integrated (02/12/2022)   Social Connection and Isolation Panel [NHANES]    Frequency of Communication with Friends and Family: More than three times a week    Frequency of Social Gatherings with Friends and Family: More than three times a week    Attends Religious Services: More than 4 times per year    Active Member of [employer] or Organizations: No    Attends Archivist Meetings: Never    Marital Status: Living with partner  Intimate Partner Violence: Not At Risk (05/24/2022)   Humiliation, Afraid, Rape, and Kick questionnaire    Fear of Current or Ex-Partner: No    Emotionally Abused: No    Physically Abused: No    Sexually Abused: No    Family History  Problem Relation Age of Onset   Heart disease Mother    Hypertension Mother    Cancer - Other  Mother    Bipolar disorder Mother    Heart disease Father    Hypertension Father    Diabetes Father    Parkinson's disease Maternal Grandmother    Cancer Maternal Aunt    Cancer Maternal Uncle    Cancer Maternal Grandfather    Lupus Paternal Grandmother    Hypertension Brother      Current Outpatient Medications:    baclofen (LIORESAL) 10 MG tablet, Take 10 mg by mouth 2 (two) times daily., Disp: , Rfl:    benztropine (COGENTIN) 1 MG tablet, Take 1 tablet (1 mg total) by mouth daily as needed for tremors., Disp: 90 tablet, Rfl: 0   Cholecalciferol (VITAMIN D) 125 MCG (5000 UT) CAPS, Take 5,000 Units by mouth daily., Disp: , Rfl:    CRANBERRY PO, Take 1 capsule by mouth in the morning and at bedtime., Disp: , Rfl:    eszopiclone (LUNESTA) 2 MG TABS tablet, TAKE ONE TABLET BY MOUTH EVERYDAY AT BEDTIME AS NEEDED FOR SLEEP. take immediately BEFORE bedtime, Disp: 30 tablet, Rfl: 1   ezetimibe (ZETIA) 10 MG tablet, Take 10 mg by mouth every morning., Disp: , Rfl:    fluticasone (FLONASE) 50 MCG/ACT nasal spray, Place 1 spray into both nostrils daily as needed for allergies., Disp: , Rfl:    icosapent Ethyl (VASCEPA) 1 g capsule, Take 2 capsules (2 g total) by mouth 2 (two) times daily., Disp: 120 capsule, Rfl: 5   lisinopril (ZESTRIL) 20 MG tablet, Take 1 tablet (20 mg total) by mouth daily., Disp: 90 tablet, Rfl: 3   lurasidone (LATUDA) 20 MG TABS tablet, Take 1 tablet (20 mg total) by mouth daily with supper., Disp: 90 tablet, Rfl: 0   Magnesium 500 MG CAPS, Take 500 mg by mouth daily., Disp: , Rfl:    Melatonin 10 MG TABS, Take 10 mg by mouth at bedtime., Disp: , Rfl:    metFORMIN (GLUCOPHAGE) 1000 MG tablet, TAKE ONE TABLET BY MOUTH TWICE DAILY with meals, Disp: 180 tablet, Rfl: 2   MYRBETRIQ 50 MG TB24 tablet, TAKE ONE TABLET BY MOUTH ONCE DAILY, Disp: 90 tablet, Rfl: 3   oxyCODONE ER (  XTAMPZA ER) 13.5 MG C12A, Take 13.5 mg by mouth 2 (two) times daily., Disp: , Rfl:    pregabalin  (LYRICA) 200 MG capsule, Take 1 capsule (200 mg total) by mouth 2 (two) times daily., Disp: 60 capsule, Rfl: 0   rosuvastatin (CRESTOR) 10 MG tablet, Take 1 tablet (10 mg total) by mouth daily., Disp: 90 tablet, Rfl: 3   tacrolimus (PROGRAF) 1 MG capsule, Take by mouth., Disp: , Rfl:    UBRELVY 50 MG TABS, Take 1 tablet by mouth daily as needed., Disp: , Rfl:    venlafaxine XR (EFFEXOR XR) 37.5 MG 24 hr capsule, Take 1 capsule (37.5 mg total) by mouth daily with breakfast. Take along with 75 mg daily, Disp: 90 capsule, Rfl: 0   venlafaxine XR (EFFEXOR XR) 75 MG 24 hr capsule, Take 1 capsule (75 mg total) by mouth daily with breakfast., Disp: 90 capsule, Rfl: 0   XYLIMELTS 550 MG DISK, Take 550 mg by mouth every 8 (eight) hours as needed (dry mouth)., Disp: , Rfl:    Dulaglutide 4.5 MG/0.5ML SOPN, Inject 4.5 mg into the skin once a week., Disp: , Rfl:    naloxone (NARCAN) nasal spray 4 mg/0.1 mL, Place 0.4 mg into the nose once. (Patient not taking: Reported on 05/31/2022), Disp: , Rfl:    XTAMPZA ER 9 MG C12A, Take 1 capsule by mouth every 12 (twelve) hours. TAKES 13.5 MG DAILY (Patient not taking: Reported on 05/31/2022), Disp: , Rfl:   Physical exam:  Vitals:   05/31/22 0856  BP: 109/70  Pulse: 65  Resp: 18  Temp: 97.8 F (36.6 C)  SpO2: 100%  Weight: 237 lb 8 oz (107.7 kg)   Physical Exam Constitutional:      General: She is not in acute distress. Cardiovascular:     Rate and Rhythm: Normal rate and regular rhythm.     Heart sounds: Normal heart sounds.  Pulmonary:     Effort: Pulmonary effort is normal.     Breath sounds: Normal breath sounds.  Abdominal:     General: Bowel sounds are normal.     Palpations: Abdomen is soft.  Lymphadenopathy:     Comments: No palpable axillary adenopathy  Skin:    General: Skin is warm and dry.  Neurological:     Mental Status: She is alert and oriented to person, place, and time.         Latest Ref Rng & Units 05/27/2022   11:33 AM   CMP  Glucose 65 - 99 mg/dL 119   BUN 7 - 25 mg/dL 34   Creatinine 0.50 - 0.99 mg/dL 2.10   Sodium 135 - 146 mmol/L 138   Potassium 3.5 - 5.3 mmol/L 4.9   Chloride 98 - 110 mmol/L 102   CO2 20 - 32 mmol/L 22   Calcium 8.6 - 10.2 mg/dL 9.6   Total Protein 6.1 - 8.1 g/dL 7.2   Total Bilirubin 0.2 - 1.2 mg/dL 0.7   AST 10 - 35 U/L 21   ALT 6 - 29 U/L 17       Latest Ref Rng & Units 05/27/2022   11:33 AM  CBC  WBC 3.8 - 10.8 Thousand/uL 10.2   Hemoglobin 11.7 - 15.5 g/dL 14.4   Hematocrit 35.0 - 45.0 % 43.6   Platelets 140 - 400 Thousand/uL 149     No images are attached to the encounter.  MR BRAIN WO CONTRAST  Result Date: 05/29/2022 CLINICAL DATA:  Trigeminal neuralgia. B12  deficiency. Tremors for 2 months. Evaluation for Parkinson's disease with family history of the same. EXAM: MRI HEAD WITHOUT CONTRAST TECHNIQUE: Multiplanar, multiecho pulse sequences of the brain and surrounding structures were obtained without intravenous contrast. COMPARISON:  Head MRI 05/29/2021 FINDINGS: Brain: There is no evidence of an acute infarct, intracranial hemorrhage, mass, midline shift, or extra-axial fluid collection. The ventricles and sulci are normal. The brain is normal in signal. A partially empty sella is unchanged. The cerebellar tonsils are normally positioned. Vascular: Unremarkable bone marrow signal. Skull and upper cervical spine: Unremarkable bone marrow signal. Sinuses/Orbits: Unremarkable orbits. Chronic mucosal thickening and/or fluid in the right sphenoid sinus. Clear mastoid air cells. Other: None. IMPRESSION: Unchanged and unremarkable appearance of the brain aside from a partially empty sella. Electronically Signed   By: Logan Bores M.D.   On: 05/29/2022 17:10   NM PET Image Restag (PS) Skull Base To Thigh  Result Date: 05/29/2022 CLINICAL DATA:  Subsequent treatment strategy for marginal zone lymphoma the parotid gland. EXAM: NUCLEAR MEDICINE PET SKULL BASE TO THIGH TECHNIQUE:  13.38 mCi F-18 FDG was injected intravenously. Full-ring PET imaging was performed from the skull base to thigh after the radiotracer. CT data was obtained and used for attenuation correction and anatomic localization. Fasting blood glucose: 139 mg/dl COMPARISON:  PET-CT 11/05/2021 and 07/08/2019. Abdominopelvic CT 01/15/2021. FINDINGS: Mediastinal blood pool activity: SUV max 1.2 Liver activity: SUV max 3.3 NECK: No hypermetabolic cervical lymph nodes are identified.Mildly asymmetric activity within the lymphoid tissue of Waldeyer's ring is within physiologic limits. No suspicious activity identified within the pharyngeal mucosal space. No abnormal activity within the parotid glands. Incidental CT findings: Scattered punctate calcifications within the parotid glands bilaterally are unchanged. CHEST: There are no hypermetabolic mediastinal, hilar or axillary lymph nodes. No hypermetabolic pulmonary activity or suspicious nodularity. Incidental CT findings: Left subclavian Port-A-Cath extends to the superior cavoatrial junction. Stable mild central airway thickening, calcified subcarinal lymph nodes and bowel dilatation of the distal esophagus. ABDOMEN/PELVIS: There is no hypermetabolic activity within the liver, adrenal glands, spleen or pancreas. There is no hypermetabolic nodal activity in the abdomen or pelvis. Incidental CT findings: Stable mild biliary dilatation post cholecystectomy, within physiologic limits. There is cortical scarring in the left kidney. Postsurgical changes from right lateral abdominal wall hernia repair with an adjacent spigelian hernia containing omental fat and a small portion of the colon. No evidence of incarceration or obstruction. SKELETON: There is no hypermetabolic activity to suggest osseous metastatic disease. Incidental CT findings: none IMPRESSION: 1. Stable PET-CT.  No evidence of recurrent lymphoma. 2. Stable incidental findings as detailed above. Electronically Signed   By:  Richardean Sale M.D.   On: 05/29/2022 12:30     Assessment and plan- Patient is a 46 y.o. 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  She is here to discuss PET CT scan results and further management  I have reviewed PET CT scan images independently and discussed findings with the patient which does not show any evidence of recurrent lymphoma.  Clinically I do not palpate any bilateral axillary adenopathy.  Also her weight has not changed since February 2023 when she was 237 pounds as well as per our records.  As such I will continue to follow-up her lymphoma every 6 months for routine physical exam and see her back at that time with lab   Visit Diagnosis 1.  Encounter for follow-up surveillance of lymphoma   2. Thrombocytopenia ([place])      Dr. Randa Evens, MD, MPH [facility] [phone] 05/31/2022 12:43 PM

## 2022-06-01 ENCOUNTER — Other Ambulatory Visit: Payer: Self-pay

## 2022-06-04 DIAGNOSIS — F4310 Post-traumatic stress disorder, unspecified: Secondary | ICD-10-CM | POA: Diagnosis not present

## 2022-06-04 DIAGNOSIS — F411 Generalized anxiety disorder: Secondary | ICD-10-CM | POA: Diagnosis not present

## 2022-06-04 DIAGNOSIS — F3181 Bipolar II disorder: Secondary | ICD-10-CM | POA: Diagnosis not present

## 2022-06-05 ENCOUNTER — Ambulatory Visit
Admission: RE | Admit: 2022-06-05 | Discharge: 2022-06-05 | Disposition: A | Discharge status: Home / Self Care | Payer: Medicaid Other | Source: Ambulatory Visit | Attending: Nurse Practitioner | Admitting: Nurse Practitioner

## 2022-06-05 DIAGNOSIS — N184 Chronic kidney disease, stage 4 (severe): Secondary | ICD-10-CM | POA: Diagnosis not present

## 2022-06-05 DIAGNOSIS — M329 Systemic lupus erythematosus, unspecified: Secondary | ICD-10-CM | POA: Diagnosis not present

## 2022-06-05 DIAGNOSIS — N281 Cyst of kidney, acquired: Secondary | ICD-10-CM | POA: Diagnosis not present

## 2022-06-05 DIAGNOSIS — R944 Abnormal results of kidney function studies: Secondary | ICD-10-CM | POA: Diagnosis not present

## 2022-06-11 ENCOUNTER — Other Ambulatory Visit: Payer: Self-pay | Admitting: Psychiatry

## 2022-06-11 DIAGNOSIS — F4310 Post-traumatic stress disorder, unspecified: Secondary | ICD-10-CM

## 2022-06-11 DIAGNOSIS — F411 Generalized anxiety disorder: Secondary | ICD-10-CM

## 2022-06-11 DIAGNOSIS — G4701 Insomnia due to medical condition: Secondary | ICD-10-CM

## 2022-06-12 DIAGNOSIS — F4310 Post-traumatic stress disorder, unspecified: Secondary | ICD-10-CM | POA: Diagnosis not present

## 2022-06-12 DIAGNOSIS — F411 Generalized anxiety disorder: Secondary | ICD-10-CM | POA: Diagnosis not present

## 2022-06-12 DIAGNOSIS — F3181 Bipolar II disorder: Secondary | ICD-10-CM | POA: Diagnosis not present

## 2022-06-14 ENCOUNTER — Other Ambulatory Visit: Payer: Self-pay | Admitting: Nurse Practitioner

## 2022-06-17 NOTE — Telephone Encounter (Signed)
Requested Prescriptions  Pending Prescriptions Disp Refills  . metFORMIN (GLUCOPHAGE) 1000 MG tablet [Pharmacy Med Name: metformin 1,000 mg tablet] 180 tablet 2    Sig: TAKE ONE TABLET BY MOUTH TWICE DAILY with meals     Endocrinology:  Diabetes - Biguanides Failed - 06/14/2022  8:01 AM      Failed - Cr in normal range and within 360 days    Creat  Date Value Ref Range Status  05/27/2022 2.10 (H) 0.50 - 0.99 mg/dL Final   Creatinine, Urine  Date Value Ref Range Status  06/05/2020 113 20 - 275 mg/dL Final         Failed - eGFR in normal range and within 360 days    GFR, Est African American  Date Value Ref Range Status  12/11/2020 74 > OR = 60 mL/min/1.38m Final   GFR, Est Non African American  Date Value Ref Range Status  12/11/2020 64 > OR = 60 mL/min/1.748mFinal   GFR, Estimated  Date Value Ref Range Status  05/15/2022 57 (L) >60 mL/min Final    Comment:    (NOTE) Calculated using the CKD-EPI Creatinine Equation (2021)    eGFR  Date Value Ref Range Status  05/27/2022 29 (L) > OR = 60 mL/min/1.7311minal         Failed - B12 Level in normal range and within 720 days    No results found for: "VITAMINB12"       Passed - HBA1C is between 0 and 7.9 and within 180 days    Hemoglobin A1C  Date Value Ref Range Status  03/06/2022 6.4  Final         Passed - Valid encounter within last 6 months    Recent Outpatient Visits          3 weeks ago [facility]ential hypertension   [facility]   4 months ago Essential hypertension   [facility]   8 months ago Migraine with aura and without status migrainosus, not intractable   [facility]   9 months ago Essential hypertension   [facility]   1 year ago Uncontrolled type 2 diabetes mellitus with hyperglycemia ([facility]       Future Appointments            In 4 days Agbor-Etang, BriAaron EdelmanD [facility]. ConWirtIn 5 months PenReece PackerulMyna HidalgoNPL[facility] CenterEC           Passed - CBC within normal limits and completed in the last 12 months    WBC  Date Value Ref Range Status  05/27/2022 10.2 3.8 - 10.8 Thousand/uL Final   RBC  Date Value Ref Range Status  05/27/2022 5.00 3.80 - 5.10 Million/uL Final   Hemoglobin  Date Value Ref Range Status  05/27/2022 14.4 11.7 - 15.5 g/dL Final  10/16/2021 14.6 11.1 - 15.9 g/dL Final   HCT  Date Value Ref Range Status  05/27/2022 43.6 35.0 - 45.0 % Final   Hematocrit  Date Value Ref Range Status  10/16/2021 44.0 34.0 - 46.6 % Final   MCHC  Date Value Ref Range Status  05/27/2022 33.0 32.0 - 36.0 g/dL Final   [facility] Value Ref Range Status  05/27/2022 28.8 27.0 - 33.0 pg Final   MCV  Date Value Ref Range Status  05/27/2022 87.2 80.0 - 100.0 fL Final  10/16/2021 88 79 - 97 fL Final   No results found for: "PLTCOUNTKUC", "LABPLAT", "POCPLA" RDW  Date Value Ref Range Status  05/27/2022 13.7 11.0 - 15.0 % Final  10/16/2021 13.7 11.7 - 15.4 % Final

## 2022-06-21 ENCOUNTER — Encounter: Payer: Self-pay | Admitting: Cardiology

## 2022-06-21 ENCOUNTER — Ambulatory Visit: Payer: Medicaid Other | Attending: Cardiology | Admitting: Cardiology

## 2022-06-21 VITALS — BP 104/62 | HR 59 | Ht 66.0 in | Wt 240.0 lb

## 2022-06-21 DIAGNOSIS — Z6838 Body mass index (BMI) 38.0-38.9, adult: Secondary | ICD-10-CM

## 2022-06-21 DIAGNOSIS — E782 Mixed hyperlipidemia: Secondary | ICD-10-CM

## 2022-06-21 DIAGNOSIS — I10 Essential (primary) hypertension: Secondary | ICD-10-CM

## 2022-06-21 NOTE — Patient Instructions (Signed)
Medication Instructions:   Your physician recommends that you continue on your current medications as directed. Please refer to the Current Medication list given to you today.  *If you need a refill on your cardiac medications before your next appointment, please call your pharmacy*    Follow-Up: At [facility] you and your health needs are our priority.  As part of our continuing mission to provide you with exceptional heart care, we have created designated Provider Care Teams.  These Care Teams include your primary Cardiologist (physician) and Advanced Practice Providers (APPs -  Physician Assistants and Nurse Practitioners) who all work together to provide you with the care you need, when you need it.  We recommend signing up for the patient portal called "MyChart".  Sign up information is provided on this After Visit Summary.  MyChart is used to connect with patients for Virtual Visits (Telemedicine).  Patients are able to view lab/test results, encounter notes, upcoming appointments, etc.  Non-urgent messages can be sent to your provider as well.   To learn more about what you can do with MyChart, go to [email or web address].    Your next appointment:   Follow up as needed   The format for your next appointment:   In Person  Provider:   [name], MD    Other Instructions   Important Information About Sugar

## 2022-06-21 NOTE — Progress Notes (Signed)
Cardiology Office Note:    Date:  06/21/2022   ID:  Alison [name], DOB 12-16-1975, MRN [id number]  PCP:  Bo Merino, FNP   [facility] Providers Cardiologist:  [name], MD     Referring MD: Bo Merino, FNP   Chief Complaint  Patient presents with   Other    6 month follow up -- Meds reviewed verbally with patient.     History of Present Illness:    Alison [name] is a 46 y.o. female with a hx of hypertension, diabetes, obesity, liver transplant 1993 who presents for follow-up.    Denies chest pain or shortness of breath, recently diagnosed with renal dysfunction, has appointment with nephrologist upcoming.  Compliant with medications as prescribed.  Also eating healthier, trying to lose weight.    Prior notes Echo 10/2020 EF 60 to 06%, normal diastolic function Lexiscan Myoview 09/2021, no evidence of ischemia, low risk sca QT prolongation secondary to right bundle branch block, corrected QT is normal. 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) 1993   Sleep apnea    has not recieved cpap yet-other one was recalled    Past Surgical History:  Procedure Laterality Date   BONE MARROW BIOPSY  01/13/2015   BREAST BIOPSY  12/2014   BREAST BIOPSY  2011   BREAST SURGERY     CHOLECYSTECTOMY     COLONOSCOPY     COLONOSCOPY WITH PROPOFOL N/A 02/28/2022   Procedure: COLONOSCOPY WITH PROPOFOL;  Surgeon: [name], MD;  Location: [facility] ENDOSCOPY;  Service: Endoscopy;  Laterality: N/A;   ESOPHAGOGASTRODUODENOSCOPY     ESOPHAGOGASTRODUODENOSCOPY (EGD) WITH PROPOFOL N/A 01/09/2021   Procedure: ESOPHAGOGASTRODUODENOSCOPY (EGD) WITH PROPOFOL;  Surgeon: [name], MD;  Location: [facility] ENDOSCOPY;  Service: Endoscopy;  Laterality: N/A;   HERNIA REPAIR     x4-all from liver transplant   HYSTEROSCOPY WITH D & C N/A 11/13/2021   Procedure: DILATATION AND CURETTAGE /HYSTEROSCOPY;  Surgeon: Homero Fellers, MD;  Location: [facility] ORS;  Service: Gynecology;  Laterality: N/A;   infusaport     LIVER TRANSPLANT  12/17/1991   LUMBAR PUNCTURE     PORT A CATH INJECTION ([place] [facility])     RENAL BIOPSY  tumor removal  2015   cancer right side    Current Medications: Current Meds  Medication Sig   baclofen (LIORESAL) 10 MG tablet Take 10 mg by mouth 2 (two) times daily.   benztropine (COGENTIN) 1 MG tablet Take 1 tablet (1 mg total) by mouth daily as needed for tremors.   Cholecalciferol (VITAMIN D) 125 MCG (5000 UT) CAPS Take 5,000 Units by mouth daily.   CRANBERRY PO Take 1 capsule by mouth in the morning and at bedtime.   Dulaglutide 4.5 MG/0.5ML SOPN Inject 4.5 mg into the skin once a week.   eszopiclone (LUNESTA) 2 MG TABS tablet TAKE ONE TABLET BY MOUTH EVERYDAY AT BEDTIME AS NEEDED FOR SLEEP. take immediately BEFORE bedtime   ezetimibe (ZETIA) 10 MG tablet Take 10 mg by mouth every morning.   fluticasone (FLONASE) 50 MCG/ACT nasal spray Place 1 spray into both  nostrils daily as needed for allergies.   icosapent Ethyl (VASCEPA) 1 g capsule Take 2 capsules (2 g total) by mouth 2 (two) times daily.   lisinopril (ZESTRIL) 20 MG tablet Take 1 tablet (20 mg total) by mouth daily.   lurasidone (LATUDA) 20 MG TABS tablet Take 1 tablet (20 mg total) by mouth daily with supper.   Magnesium 500 MG CAPS Take 500 mg by mouth daily.   Melatonin 10 MG TABS Take 10 mg by mouth at bedtime.   metFORMIN (GLUCOPHAGE) 1000 MG tablet TAKE ONE TABLET BY MOUTH TWICE DAILY with meals   MYRBETRIQ 50 MG TB24 tablet TAKE ONE TABLET BY MOUTH ONCE DAILY   naloxone (NARCAN) nasal spray 4 mg/0.1 mL Place 0.4 mg into the nose once.   oxyCODONE ER (XTAMPZA ER) 13.5 MG C12A Take 13.5 mg by mouth 2 (two) times daily.   pregabalin (LYRICA) 200 MG capsule Take 1 capsule (200 mg total) by mouth 2 (two) times daily.   rosuvastatin (CRESTOR) 10 MG tablet Take 1 tablet (10 mg total) by mouth daily.   tacrolimus (PROGRAF) 1 MG capsule Take by mouth.   UBRELVY 50 MG TABS Take 1 tablet by mouth daily as needed.   venlafaxine XR (EFFEXOR XR) 37.5 MG 24 hr capsule Take 1 capsule (37.5 mg total) by mouth daily with breakfast. Take along with 75 mg daily   venlafaxine XR (EFFEXOR XR) 75 MG 24 hr capsule Take 1 capsule (75 mg total) by mouth daily with breakfast.   XYLIMELTS 550 MG DISK Take 550 mg by mouth every 8 (eight) hours as needed (dry mouth).     Allergies:   Penicillin g, Lamictal [lamotrigine], Carbamazepine, Hydrocodone-acetaminophen, Naproxen, and Penicillins   Social History   Socioeconomic History   Marital status: Single    Spouse name: Not on file   Number of children: 0   Years of education: 9   Highest education level: GED or equivalent  Occupational History   Occupation: disabled  Tobacco Use   Smoking status: Never   Smokeless tobacco: Never  Vaping Use   Vaping Use: Never used  Substance and Sexual Activity   Alcohol use: Not Currently   Drug use: Not Currently     Comment: pain managment last used in early april   Sexual activity: Not Currently  Other Topics Concern   Not on file  Social History Narrative   Not on file   Social Determinants of Health   Financial Resource Strain: Low Risk  (04/23/2022)   Overall Financial Resource Strain (CARDIA)    Difficulty of Paying Living Expenses:  Not very hard  Food Insecurity: No Food Insecurity (03/15/2022)   Hunger Vital Sign    Worried About Running Out of Food in the Last Year: Never true    Ran Out of Food in the Last Year: Never true  Transportation Needs: No Transportation Needs (03/15/2022)   PRAPARE - Hydrologist (Medical): No    Lack of Transportation (Non-Medical): No  Physical Activity: Sufficiently Active (04/23/2022)   Exercise Vital Sign    Days of Exercise per Week: 3 days    Minutes of Exercise per Session: 150+ min  Stress: Stress Concern Present (05/24/2022)   [place]    Feeling of Stress : Very much  Social Connections: Moderately Integrated (02/12/2022)   Social Connection and Isolation Panel [NHANES]    Frequency of Communication with Friends and Family: More than three times a week    Frequency of Social Gatherings with Friends and Family: More than three times a week    Attends Religious Services: More than 4 times per year    Active Member of [employer] or Organizations: No    Attends Music therapist: Never    Marital Status: Living with partner     Family History: The patient's family history includes Bipolar disorder in her mother; Cancer in her maternal aunt, maternal grandfather, and maternal uncle; Cancer - Other in her mother; Diabetes in her father; Heart disease in her father and mother; Hypertension in her brother, father, and mother; Lupus in her paternal grandmother; Parkinson's disease in her maternal grandmother.  ROS:   Please see the history of present  illness.     All other systems reviewed and are negative.  EKGs/Labs/Other Studies Reviewed:    The following studies were reviewed today:   EKG:  EKG is  ordered today.  The ekg ordered today demonstrates sinus bradycardia, right bundle branch block  Recent Labs: 05/27/2022: ALT 17; BUN 34; Creat 2.10; Hemoglobin 14.4; Platelets 149; Potassium 4.9; Sodium 138  Recent Lipid Panel    Component Value Date/Time   CHOL 81 05/27/2022 1133   CHOL 173 12/03/2021 0804   TRIG 203 (H) 05/27/2022 1133   HDL 35 (L) 05/27/2022 1133   HDL 38 (L) 12/03/2021 0804   CHOLHDL 2.3 05/27/2022 1133   LDLCALC 19 05/27/2022 1133     Risk Assessment/Calculations:          Physical Exam:    VS:  BP 104/62 (BP Location: Left Arm, Patient Position: Sitting, Cuff Size: Normal)   Pulse (!) 59   Ht _0  (1.676 m)   Wt 240 lb (108.9 kg)   BMI 38.74 kg/m     Wt Readings from Last 3 Encounters:  06/21/22 240 lb (108.9 kg)  05/31/22 237 lb 8 oz (107.7 kg)  05/27/22 236 lb (107 kg)     GEN:  Well nourished, well developed in no acute distress HEENT: Normal NECK: No JVD; No carotid bruits CARDIAC: RRR, no murmurs, rubs, gallops RESPIRATORY:  Clear to auscultation without rales, wheezing or rhonchi  ABDOMEN: Soft, non-tender, non-distended MUSCULOSKELETAL:  No edema; No deformity  SKIN: Warm and dry NEUROLOGIC:  Alert and oriented x 3 PSYCHIATRIC:  Normal affect   ASSESSMENT:    1. Primary hypertension   2. Mixed hyperlipidemia   3. BMI 38.0-38.9,adult    PLAN:    In order of problems listed above:  Hypertension, BP controlled.  Continue lisinopril  Hyperlipidemia, cholesterol  elevated.  Not on statins due to history of liver transplant.  Continue Vascepa 2 g twice daily, continue Zetia,  Obesity, continue low-calorie diet, weight loss.  Follow-up as needed    Medication Adjustments/Labs and Tests Ordered: Current medicines are reviewed at length with the patient today.  Concerns  regarding medicines are outlined above.  Orders Placed This Encounter  Procedures   EKG 12-Lead    No orders of the defined types were placed in this encounter.    Patient Instructions  Medication Instructions:   Your physician recommends that you continue on your current medications as directed. Please refer to the Current Medication list given to you today.  *If you need a refill on your cardiac medications before your next appointment, please call your pharmacy*    Follow-Up: At [facility], you and your health needs are our priority.  As part of our continuing mission to provide you with exceptional heart care, we have created designated Provider Care Teams.  These Care Teams include your primary Cardiologist (physician) and Advanced Practice Providers (APPs -  Physician Assistants and Nurse Practitioners) who all work together to provide you with the care you need, when you need it.  We recommend signing up for the patient portal called "MyChart".  Sign up information is provided on this After Visit Summary.  MyChart is used to connect with patients for Virtual Visits (Telemedicine).  Patients are able to view lab/test results, encounter notes, upcoming appointments, etc.  Non-urgent messages can be sent to your provider as well.   To learn more about what you can do with MyChart, go to [email or web address].    Your next appointment:   Follow up as needed   The format for your next appointment:   In Person  Provider:   [name], MD    Other Instructions   Important Information About Sugar         Signed, [name], MD  06/21/2022 12:38 PM    [place]

## 2022-06-26 DIAGNOSIS — R809 Proteinuria, unspecified: Secondary | ICD-10-CM | POA: Diagnosis not present

## 2022-06-26 DIAGNOSIS — N1831 Chronic kidney disease, stage 3a: Secondary | ICD-10-CM | POA: Diagnosis not present

## 2022-06-26 DIAGNOSIS — N179 Acute kidney failure, unspecified: Secondary | ICD-10-CM | POA: Diagnosis not present

## 2022-06-26 DIAGNOSIS — E1122 Type 2 diabetes mellitus with diabetic chronic kidney disease: Secondary | ICD-10-CM | POA: Diagnosis not present

## 2022-06-26 DIAGNOSIS — R8290 Unspecified abnormal findings in urine: Secondary | ICD-10-CM | POA: Diagnosis not present

## 2022-06-27 ENCOUNTER — Other Ambulatory Visit: Payer: Self-pay | Admitting: *Deleted

## 2022-06-27 ENCOUNTER — Inpatient Hospital Stay: Payer: Medicaid Other | Attending: Oncology

## 2022-06-27 DIAGNOSIS — N1831 Chronic kidney disease, stage 3a: Secondary | ICD-10-CM

## 2022-06-27 DIAGNOSIS — C884 Extranodal marginal zone B-cell lymphoma of mucosa-associated lymphoid tissue [MALT-lymphoma]: Secondary | ICD-10-CM | POA: Insufficient documentation

## 2022-06-27 DIAGNOSIS — R8290 Unspecified abnormal findings in urine: Secondary | ICD-10-CM

## 2022-06-27 DIAGNOSIS — Z95828 Presence of other vascular implants and grafts: Secondary | ICD-10-CM

## 2022-06-27 DIAGNOSIS — N183 Chronic kidney disease, stage 3 unspecified: Secondary | ICD-10-CM

## 2022-06-27 DIAGNOSIS — R809 Proteinuria, unspecified: Secondary | ICD-10-CM

## 2022-06-27 DIAGNOSIS — D696 Thrombocytopenia, unspecified: Secondary | ICD-10-CM | POA: Diagnosis not present

## 2022-06-27 DIAGNOSIS — G4733 Obstructive sleep apnea (adult) (pediatric): Secondary | ICD-10-CM | POA: Diagnosis not present

## 2022-06-27 LAB — RENAL FUNCTION PANEL
Albumin: 4.1 g/dL (ref 3.5–5.0)
Anion gap: 11 (ref 5–15)
BUN: 21 mg/dL — ABNORMAL HIGH (ref 6–20)
CO2: 25 mmol/L (ref 22–32)
Calcium: 9.6 mg/dL (ref 8.9–10.3)
Chloride: 104 mmol/L (ref 98–111)
Creatinine, Ser: 1.17 mg/dL — ABNORMAL HIGH (ref 0.44–1.00)
GFR, Estimated: 58 mL/min — ABNORMAL LOW (ref >60)
Glucose, Bld: 201 mg/dL — ABNORMAL HIGH (ref 70–99)
Phosphorus: 4.8 mg/dL — ABNORMAL HIGH (ref 2.5–4.6)
Potassium: 4.6 mmol/L (ref 3.5–5.1)
Sodium: 140 mmol/L (ref 135–145)

## 2022-06-27 LAB — CBC WITH DIFFERENTIAL/PLATELET
Abs Immature Granulocytes: 0.03 10*3/uL (ref 0.00–0.07)
Basophils Absolute: 0.1 10*3/uL (ref 0.0–0.1)
Basophils Relative: 1 %
Eosinophils Absolute: 0.6 10*3/uL — ABNORMAL HIGH (ref 0.0–0.5)
Eosinophils Relative: 8 %
HCT: 40.6 % (ref 36.0–46.0)
Hemoglobin: 13.6 g/dL (ref 12.0–15.0)
Immature Granulocytes: 0 %
Lymphocytes Relative: 37 %
Lymphs Abs: 2.7 10*3/uL (ref 0.7–4.0)
MCH: 28.9 pg (ref 26.0–34.0)
MCHC: 33.5 g/dL (ref 30.0–36.0)
MCV: 86.4 fL (ref 80.0–100.0)
Monocytes Absolute: 0.5 10*3/uL (ref 0.1–1.0)
Monocytes Relative: 6 %
Neutro Abs: 3.5 10*3/uL (ref 1.7–7.7)
Neutrophils Relative %: 48 %
Platelets: 133 10*3/uL — ABNORMAL LOW (ref 150–400)
RBC: 4.7 MIL/uL (ref 3.87–5.11)
RDW: 13.4 % (ref 11.5–15.5)
WBC: 7.3 10*3/uL (ref 4.0–10.5)
nRBC: 0 % (ref 0.0–0.2)

## 2022-06-27 LAB — URIC ACID: Uric Acid, Serum: 8.4 mg/dL — ABNORMAL HIGH (ref 2.5–7.1)

## 2022-06-27 LAB — MAGNESIUM: Magnesium: 1.7 mg/dL (ref 1.7–2.4)

## 2022-06-27 MED ORDER — SODIUM CHLORIDE 0.9% FLUSH
10.0000 mL | Freq: Once | INTRAVENOUS | Status: AC
  Administered 2022-06-27: 10 mL via INTRAVENOUS
  Filled 2022-06-27: qty 10

## 2022-06-27 MED ORDER — HEPARIN SOD (PORK) LOCK FLUSH 100 UNIT/ML IV SOLN
500.0000 [IU] | Freq: Once | INTRAVENOUS | Status: AC
  Administered 2022-06-27: 500 [IU] via INTRAVENOUS
  Filled 2022-06-27: qty 5

## 2022-07-03 ENCOUNTER — Telehealth: Payer: Self-pay

## 2022-07-03 DIAGNOSIS — Z79899 Other long term (current) drug therapy: Secondary | ICD-10-CM | POA: Diagnosis not present

## 2022-07-03 LAB — PTH, INTACT AND CALCIUM
Calcium, Total (PTH): 7.6 mg/dL — ABNORMAL LOW (ref 8.7–10.2)
PTH: 32 pg/mL (ref 15–65)

## 2022-07-03 NOTE — Telephone Encounter (Signed)
Faxed over lab results to Dr. Candiss Norse office at [facility] per Dr. Janese Banks receieved a fax confirmation.

## 2022-07-04 ENCOUNTER — Other Ambulatory Visit: Payer: Self-pay | Admitting: Obstetrics and Gynecology

## 2022-07-04 DIAGNOSIS — Z944 Liver transplant status: Secondary | ICD-10-CM | POA: Diagnosis not present

## 2022-07-04 DIAGNOSIS — E7849 Other hyperlipidemia: Secondary | ICD-10-CM | POA: Diagnosis not present

## 2022-07-04 DIAGNOSIS — E1165 Type 2 diabetes mellitus with hyperglycemia: Secondary | ICD-10-CM | POA: Diagnosis not present

## 2022-07-04 DIAGNOSIS — I10 Essential (primary) hypertension: Secondary | ICD-10-CM | POA: Diagnosis not present

## 2022-07-04 NOTE — Patient Instructions (Signed)
Hi Ms. Istre, glad to catch up with you today-have a nice day!  Ms. Faulks was given information about Medicaid Managed Care team care coordination services as a part of their Healthy [facility] Medicaid benefit. [name] verbally consented to engagement with the [facility] Managed Care team.   If you are experiencing a medical emergency, please call 911 or report to your local emergency department or urgent care.   If you have a non-emergency medical problem during routine business hours, please contact your provider's office and ask to speak with a nurse.   For questions related to your Healthy [facility] health plan, please call: [phone] or visit the homepage here: [email or web address]  If you would like to schedule transportation through your Healthy [facility] plan, please call the following number at least 2 days in advance of your appointment: [phone]  For information about your ride after you set it up, call Ride Assist at [phone]. Use this number to activate a Will Call pickup, or if your transportation is late for a scheduled pickup. Use this number, too, if you need to make a change or cancel a previously scheduled reservation.  If you need transportation services right away, call [phone]. The after-hours call center is staffed 24 hours to handle ride assistance and urgent reservation requests (including discharges) 365 days a year. Urgent trips include sick visits, hospital discharge requests and life-sustaining treatment.  Call the [facility] at [phone], at any time, 24 hours a day, 7 days a week. If you are in danger or need immediate medical attention call 911.  If you would like help to quit smoking, call 1-800-QUIT-NOW (340) 262-5779) OR Espaol: 1-855-Djelo-Ya (9-675-916-3846) o para ms informacin haga clic aqu or Text READY to 200-400 to register via text  Ms. Musso - following are  the goals we discussed in your visit today:   Goals Addressed             This Visit's Progress    Chronic Pain Managed       07/04/22  patient states her pain is improved right now, no complaints today, followed by Dr. Humphrey Rolls for pain management     Get sugars at goal       Timeframe:  Short-Term Goal Priority:  High Start Date:                             Expected End Date:      ongoing                 Follow Up Date: 08/07/22   - call for medicine refill 2 or 3 days before it runs out - call if I am sick and can't take my medicine - keep a list of all the medicines I take; vitamins and herbals too    Why is this important?   These steps will help you keep on track with your medicines.   07/04/22-checks once a day-increased lately-150-200 d/t eating ice cream per patient-worried about her Father and his health.   Patient verbalizes understanding of instructions and care plan provided today and agrees to view in [place]. Active MyChart status and patient understanding of how to access instructions and care plan via MyChart confirmed with patient.     The Managed Medicaid care management team will reach out to the patient again over the next 30 business  days.  The  Patient  has been provided with  contact information for the Managed Medicaid care management team and has been advised to call with any health related questions or concerns.   [name], [name] [place] Management Coordinator - Managed Medicaid High Risk [phone]   Following is a copy of your plan of care:  Care Plan : Chronic Pain (Adult)  Updates made by [name], RN since 07/04/2022 12:00 AM     Problem: Chronic Pain Management-fibromyalgia   Priority: High  Onset Date: 01/22/2021     Long-Range Goal: Fibromyalgia pain managed-new pain management provider   Start Date: 08/14/2020  Expected End Date: 10/03/2022  Recent Progress: On track  Priority: High  Note:    Current Barriers:  Knowledge deficits related to pain management  07/04/22 Patient states her pain is controlled right now.  Nurse Case Manager Clinical Goal(s):  Over the next 45 days, patient will work with [facility] to address needs related to referral for pain management provider and associated care coordination needs. 05/24/22:  patient is currently seeing Dr. Humphrey Rolls at [facility].  Interventions:  Inter-disciplinary care team collaboration (see longitudinal plan of care) Evaluation of current treatment plan related to fibromyalgia  and patient's adherence to plan as established by provider. Update 06/15/21:  Patient taking Baclofen again, Effexor added. Collaborated with primary care provider regarding recommendations and referral to pain management provider. Discussed plans with patient for ongoing care management follow up and provided patient with direct contact information for care management team Anticipate pain education program, pain management support as part of pain management referral.       Patient Goals/Self-Care Activities Over the next 45 days, patient will:  -Attends all scheduled provider appointments  develop a personal pain management plan with your pain management provider.  Follow Up Plan:  The Managed Medicaid care management team will reach out to the patient again over the next 30  business days.    Care Plan : Wellness (Adult)  Updates made by [name], RN since 07/04/2022 12:00 AM     Problem: Medication Adherence (Wellness)   Priority: High  Onset Date: 01/22/2021     Long-Range Goal: Medication Adherence Maintained   Start Date: 09/06/2020  Expected End Date: 08/24/2022  Recent Progress: On track  Priority: High  Note:   Current Barriers:  Chronic Disease Management of chronic health conditions 07/04/22:  Patient concerned about Father's health today and is causing her to feel more depressed-has appt 07/08/22.  Blood sugars  elevated as has been eating more ice cream d/t worrying about Father-150-200.  Plans on scheduling  trip to visit Father.  MRI and PET scan WNL. Nurse Case Manager Clinical Goal(s):  Over the next 30 days, patient will work with CM team pharmacist to review current medications. Update 01/22/21:  Patient met with Pharmacist and continues to follow. Over the next 30 days, patient will meet with Nutritionist and check her blood sugars. Over the next 30 days, patient will attend all scheduled appointments.  Interventions:  Inter-disciplinary care team collaboration (see longitudinal plan of care) Evaluation of current treatment plan and patient's adherence to plan as established by provider. Advised patient to contact her PCP for any medication needs. Reviewed medications with patient. Collaborated with pharmacy regarding medications.  Discussed plans with patient for ongoing care management follow up and provided patient with direct contact information for care management team Pharmacy referral for medication review. Patient given phone number for Healthy [facility]  transportation if needed. Will notify PCP of need for testing strips and dietician referral. Collaborated with PCP for cardiology appointment. Collaborated with Care Guide for dental resources. Care guide referral for dental resources.-completed  Patient Goals/Self-Care Activities Over the next 30 days, patient will:  -Patient will take medications as prescribed. Calls pharmacy for medication refills Calls provider office for new concerns or questions  Follow Up Plan: The Managed Medicaid care management team will reach out to the patient again over the next 30 business days.  The patient has been provided with contact information for the Managed Medicaid care management team and has been advised to call with any health related questions or concerns.

## 2022-07-04 NOTE — Patient Outreach (Signed)
Medicaid Managed Care   Nurse Care Manager Note  07/04/2022 Name:  Alison [name] MRN:  [id number] DOB:  1976-03-04  Alison [name] is an 46 y.o. year old female who is a primary patient of Bo Merino, FNP.  The [facility] Managed Care Coordination team was consulted for assistance with:    Chronic healthcare management needs, DM, HTN, chronic pain, OSD, migraines, anxiety/depression, PTSD, bipolar, fibromyalgia, SLE, h/o lymphoma  Ms. Dunstan was given information about Medicaid Managed Care Coordination team services today. [name] Patient agreed to services and verbal consent obtained.  Engaged with patient by telephone for follow up visit in response to provider referral for case management and/or care coordination services.   Assessments/Interventions:  Review of past medical history, allergies, medications, health status, including review of consultants reports, laboratory and other test data, was performed as part of comprehensive evaluation and provision of chronic care management services.  SDOH (Social Determinants of Health) assessments and interventions performed: SDOH Interventions    Flowsheet Row Patient Outreach Telephone from 07/04/2022 in [place] Patient Outreach from 05/24/2022 in [place] Patient Outreach Telephone from 04/23/2022 in [place] Patient Outreach Telephone from 03/19/2022 in [place] Patient Outreach Telephone from 03/15/2022 in [place] Patient Outreach Telephone from 02/12/2022 in [place] Interventions        Food Insecurity Interventions -- -- -- -- Intervention Not Indicated --  Housing Interventions Intervention Not Indicated -- -- -- -- Intervention Not Indicated  Transportation  Interventions -- -- -- -- Intervention Not Indicated --  Utilities Interventions Intervention Not Indicated -- -- -- -- --  Financial Strain Interventions -- -- Intervention Not Indicated Intervention Not Indicated -- --  Physical Activity Interventions -- -- Intervention Not Indicated -- -- --  Stress Interventions -- Provide Counseling  [patient sees Psychiatrist] -- -- -- --     Care Plan  Allergies  Allergen Reactions   Penicillin G Itching, Rash, Anaphylaxis and Palpitations   Lamictal [Lamotrigine] Rash   Carbamazepine Other (See Comments)    Medication interaction-prograf    Hydrocodone-Acetaminophen Itching   Naproxen Itching   Penicillins    Medications Reviewed Today     Reviewed by [name], RN (Registered Nurse) on 07/04/22 at [place] List Status: <None>   Medication Order Taking? Sig Documenting Provider Last Dose Status Informant  baclofen (LIORESAL) 10 MG tablet [id number] No Take 10 mg by mouth 2 (two) times daily. [provider] Taking Active Self  benztropine (COGENTIN) 1 MG tablet [id number] No Take 1 tablet (1 mg total) by mouth daily as needed for tremors. [name], MD Taking Active   Cholecalciferol (VITAMIN D) 125 MCG (5000 UT) CAPS [id number] No Take 5,000 Units by mouth daily. [provider] Taking Active Self  CRANBERRY PO [id number] No Take 1 capsule by mouth in the morning and at bedtime. [provider] Taking Active Self  Dulaglutide 4.5 MG/0.5ML SOPN [id number] No Inject 4.5 mg into the skin once a week. [provider] Taking Active   eszopiclone (LUNESTA) 2 MG TABS tablet [id number] No TAKE ONE TABLET BY MOUTH EVERYDAY AT BEDTIME AS NEEDED FOR SLEEP. take immediately BEFORE bedtime [name], [name], MD Taking Active   ezetimibe (ZETIA) 10 MG tablet [id number] No Take 10 mg by mouth every morning. [provider] Taking Active Self  fluticasone (FLONASE) 50 MCG/ACT nasal spray [id number] No Place 1  spray into both nostrils daily as needed for allergies. [provider] Taking Active Self  icosapent Ethyl (VASCEPA) 1 g capsule [id number] No Take 2 capsules (2 g total) by mouth 2 (two) times daily. [name], MD Taking Active Self           Med Note Jodi Mourning, [name]   Tue Mar 19, 2022 10:24 AM) Taking 1 capsule twice daily per Dr. Ronnald Collum  lisinopril (ZESTRIL) 20 MG tablet [id number] No Take 1 tablet (20 mg total) by mouth daily. [name], PA-C Taking Active Self  lurasidone (LATUDA) 20 MG TABS tablet [id number] No Take 1 tablet (20 mg total) by mouth daily with supper. [name], MD Taking Active   Magnesium 500 MG CAPS [id number] No Take 500 mg by mouth daily. [provider] Taking Active Self  Melatonin 10 MG TABS [id number] No Take 10 mg by mouth at bedtime. [provider] Taking Active Self  metFORMIN (GLUCOPHAGE) 1000 MG tablet [id number] No TAKE ONE TABLET BY MOUTH TWICE DAILY with meals Bo Merino, FNP Taking Active   MYRBETRIQ 50 MG TB24 tablet [id number] No TAKE ONE TABLET BY MOUTH ONCE DAILY Vaillancourt, Samantha, PA-C Taking Active   naloxone [facility]) nasal spray 4 mg/0.1 mL [id number] No Place 0.4 mg into the nose once. [provider] Taking Active Self  oxyCODONE ER [facility]) 13.5 MG C12A [id number] No Take 13.5 mg by mouth 2 (two) times daily. [provider] Taking Active Self  pregabalin (LYRICA) 200 MG capsule [id number] No Take 1 capsule (200 mg total) by mouth 2 (two) times daily. [name], MD Taking Active   rosuvastatin (CRESTOR) 10 MG tablet [id number] No Take 1 tablet (10 mg total) by mouth daily. [name], MD Taking Active   tacrolimus (PROGRAF) 1 MG capsule [id number] No Take by mouth. [provider] Taking Active   UBRELVY 50 MG TABS [id number] No Take 1 tablet by mouth daily as needed. [provider] Taking Active   venlafaxine XR (EFFEXOR XR) 37.5 MG 24 hr capsule  [id number] No Take 1 capsule (37.5 mg total) by mouth daily with breakfast. Take along with 75 mg daily [name], [name], MD Taking Active   venlafaxine XR (EFFEXOR XR) 75 MG 24 hr capsule [id number] No Take 1 capsule (75 mg total) by mouth daily with breakfast. [name], MD Taking Active   XYLIMELTS 550 MG DISK [id number] No Take 550 mg by mouth every 8 (eight) hours as needed (dry mouth). [provider] Taking Active Self           Patient Active Problem List   Diagnosis Date Noted   Sjogren's syndrome ([place]) 05/27/2022   Encounter for screening colonoscopy    Menorrhagia with irregular cycle    Akathisia 10/24/2021   At risk for prolonged QT interval syndrome 09/12/2021   Prolonged Q-T interval on ECG 08/29/2021   Insomnia 07/20/2021   Bipolar disorder, in full remission, most recent episode mixed ([place]) 07/20/2021   Bipolar 1 disorder, mixed, mild ([place]) 05/09/2021   GAD (generalized anxiety disorder) 05/09/2021   Hepatic cirrhosis ([place])    Opioid dependence with opioid-induced disorder ([place]) 12/20/2020   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   Bipolar disorder, in full remission, most recent episode depressed ([place]) 09/27/2020   Suprapubic pain 07/25/2020   Bipolar 1 disorder, depressed, mild ([place]) 07/10/2020   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   Edema of lower extremity  09/16/2019   Carpal tunnel syndrome, left 07/26/2019   Cubital tunnel syndrome on left 07/26/2019   Bipolar I disorder, most recent episode depressed ([place]) 07/12/2019   PTSD (post-traumatic stress disorder) 21/97/5883   Umbilical hernia without obstruction and without gangrene 06/17/2019   Encounter for surveillance of abnormal nevi 06/17/2019   Pineal gland cyst 06/17/2019   Chronic hip pain, left 06/02/2019   Lumbar spondylosis 06/02/2019   Mouth dryness 06/02/2019   Obesity (BMI 35.0-39.9 without comorbidity) 06/02/2019   Goals of care,  counseling/discussion 05/18/2019   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[place]) 05/18/2019   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[place]) 05/18/2019   Occipital neuralgia 05/13/2019   Plantar fasciitis of left foot 04/27/2019   Fibromyalgia 03/23/2019   Systemic lupus erythematosus (SLE) in adult [facility]) 03/23/2019   Essential hypertension 03/23/2019   Hepatitis 03/23/2019   Mass of left kidney 03/23/2019   Diabetes mellitus ([place]) 03/23/2019   Nausea without vomiting 03/23/2019   Neuropathy 03/23/2019   Cancer ([place]) 03/23/2019   Hyperlipidemia 03/23/2019   Osteoarthritis 03/23/2019   Trigeminal neuralgia 03/23/2019   Chronic renal disease, stage III ([place]) 03/23/2019   Nephrolithiasis 03/23/2019   Migraine 03/23/2019   OSA on CPAP 03/23/2019   Fibrocystic breast 03/23/2019   Heart murmur 03/23/2019   Bipolar disorder, in partial remission, most recent episode depressed ([place]) 07/02/2017   Abnormal uterine bleeding 03/18/2017   Major depressive disorder, recurrent ([place]) 03/18/2017   Morbid obesity ([place]) 03/18/2017   Secondary hyperparathyroidism ([place]) 02/07/2017   Neuropathic pain 11/19/2016   Post herpetic neuralgia 11/19/2016   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[place]) 01/13/2015   Lumbar disc disease with radiculopathy 07/26/2013   S/P liver transplant ([place]) 07/26/2013   Type 2 diabetes mellitus, uncontrolled 07/26/2013   Facial nerve disorder 06/29/2012   Low back pain 12/26/2010   Conditions to be addressed/monitored per PCP order:  Chronic healthcare management needs, DM, HTN, chronic pain, OSD, migraines, anxiety/depression, PTSD, bipolar, fibromyalgia, SLE, h/o lymphoma, LDD, HLD, liver transplant, osteoarthritis  Care Plan : Chronic Pain (Adult)  Updates made by [name], RN since 07/04/2022 12:00 AM     Problem: Chronic Pain Management-fibromyalgia   Priority: High  Onset Date: 01/22/2021     Long-Range Goal: Fibromyalgia  pain managed-new pain management provider   Start Date: 08/14/2020  Expected End Date: 10/03/2022  Recent Progress: On track  Priority: High  Note:   Current Barriers:  Knowledge deficits related to pain management  07/04/22 Patient states her pain is controlled right now.  Nurse Case Manager Clinical Goal(s):  Over the next 45 days, patient will work with [facility] to address needs related to referral for pain management provider and associated care coordination needs. 05/24/22:  patient is currently seeing Dr. Humphrey Rolls at [facility].  Interventions:  Inter-disciplinary care team collaboration (see longitudinal plan of care) Evaluation of current treatment plan related to fibromyalgia  and patient's adherence to plan as established by provider. Update 06/15/21:  Patient taking Baclofen again, Effexor added. Collaborated with primary care provider regarding recommendations and referral to pain management provider. Discussed plans with patient for ongoing care management follow up and provided patient with direct contact information for care management team Anticipate pain education program, pain management support as part of pain management referral.       Patient Goals/Self-Care Activities Over the next 45 days, patient will:  -Attends all scheduled provider appointments  develop a personal pain management  plan with your pain management provider.  Follow Up Plan:  The Managed Medicaid care management team will reach out to the patient again over the next 30  business days.    Care Plan : Wellness (Adult)  Updates made by [name], RN since 07/04/2022 12:00 AM     Problem: Medication Adherence (Wellness)   Priority: High  Onset Date: 01/22/2021     Long-Range Goal: Medication Adherence Maintained   Start Date: 09/06/2020  Expected End Date: 08/24/2022  Recent Progress: On track  Priority: High  Note:   Current Barriers:  Chronic Disease Management of chronic  health conditions 07/04/22:  Patient concerned about Father's health today and is causing her to feel more depressed-has appt 07/08/22.  Blood sugars elevated as has been eating more ice cream d/t worrying about Father-150-200.  Plans on scheduling  trip to visit Father.  MRI and PET scan WNL. Nurse Case Manager Clinical Goal(s):  Over the next 30 days, patient will work with CM team pharmacist to review current medications. Update 01/22/21:  Patient met with Pharmacist and continues to follow. Over the next 30 days, patient will meet with Nutritionist and check her blood sugars. Over the next 30 days, patient will attend all scheduled appointments.  Interventions:  Inter-disciplinary care team collaboration (see longitudinal plan of care) Evaluation of current treatment plan and patient's adherence to plan as established by provider. Advised patient to contact her PCP for any medication needs. Reviewed medications with patient. Collaborated with pharmacy regarding medications.  Discussed plans with patient for ongoing care management follow up and provided patient with direct contact information for care management team Pharmacy referral for medication review. Patient given phone number for [facility] transportation if needed. Will notify PCP of need for testing strips and dietician referral. Collaborated with PCP for cardiology appointment. Collaborated with Care Guide for dental resources. Care guide referral for dental resources.-completed  Patient Goals/Self-Care Activities Over the next 30 days, patient will:  -Patient will take medications as prescribed. Calls pharmacy for medication refills Calls provider office for new concerns or questions  Follow Up Plan: The Managed Medicaid care management team will reach out to the patient again over the next 30 business days.  The patient has been provided with contact information for the Managed Medicaid care management  team and has been advised to call with any health related questions or concerns.    Follow Up:  Patient agrees to Care Plan and Follow-up.  Plan: The Managed Medicaid care management team will reach out to the patient again over the next 30 business  days. and The  Patient has been provided with contact information for the Managed Medicaid care management team and has been advised to call with any health related questions or concerns.  Date/time of next scheduled RN care management/care coordination outreach: 08/07/22 at 315

## 2022-07-08 ENCOUNTER — Ambulatory Visit (INDEPENDENT_AMBULATORY_CARE_PROVIDER_SITE_OTHER): Payer: Medicaid Other | Admitting: Psychiatry

## 2022-07-08 ENCOUNTER — Encounter: Payer: Self-pay | Admitting: Psychiatry

## 2022-07-08 VITALS — BP 101/68 | HR 65 | Temp 97.8°F | Wt 244.8 lb

## 2022-07-08 DIAGNOSIS — F4310 Post-traumatic stress disorder, unspecified: Secondary | ICD-10-CM | POA: Diagnosis not present

## 2022-07-08 DIAGNOSIS — F3175 Bipolar disorder, in partial remission, most recent episode depressed: Secondary | ICD-10-CM

## 2022-07-08 DIAGNOSIS — G4701 Insomnia due to medical condition: Secondary | ICD-10-CM

## 2022-07-08 DIAGNOSIS — F411 Generalized anxiety disorder: Secondary | ICD-10-CM

## 2022-07-08 DIAGNOSIS — F3132 Bipolar disorder, current episode depressed, moderate: Secondary | ICD-10-CM | POA: Insufficient documentation

## 2022-07-08 MED ORDER — MIRTAZAPINE 7.5 MG PO TABS: 7.5000 mg | ORAL_TABLET | Freq: Every day | ORAL | 1 refills | Status: DC

## 2022-07-08 NOTE — Patient Instructions (Signed)
Mirtazapine Tablets What is this medication? MIRTAZAPINE (mir TAZ a peen) treats depression. It increases the amount of serotonin and norepinephrine in the brain, hormones that help regulate mood. This medicine may be used for other purposes; ask your health care provider or pharmacist if you have questions. COMMON BRAND NAME(S): Remeron What should I tell my care team before I take this medication? They need to know if you have any of these conditions: Bipolar disorder Glaucoma Kidney disease Liver disease Suicidal thoughts An unusual or allergic reaction to mirtazapine, other medications, foods, dyes, or preservatives Pregnant or trying to get pregnant Breast-feeding How should I use this medication? Take this medication by mouth with a glass of water. Follow the directions on the prescription label. Take your medication at regular intervals. Do not take your medication more often than directed. Do not stop taking this medication suddenly except upon the advice of your care team. Stopping this medication too quickly may cause serious side effects or your condition may worsen. A special MedGuide will be given to you by the pharmacist with each prescription and refill. Be sure to read this information carefully each time. Talk to your care team about the use of this medication in children. Special care may be needed. Overdosage: If you think you have taken too much of this medicine contact a poison control center or emergency room at once. NOTE: This medicine is only for you. Do not share this medicine with others. What if I miss a dose? If you miss a dose, take it as soon as you can. If it is almost time for your next dose, take only that dose. Do not take double or extra doses. What may interact with this medication? Do not take this medication with any of the following: Linezolid MAOIs like Carbex, Eldepryl, Marplan, Nardil, and Parnate Methylene blue (injected into a vein) This  medication may also interact with the following: Alcohol Antiviral medications for HIV or AIDS Certain medications that treat or prevent blood clots like warfarin Certain medications for depression, anxiety, or psychotic disturbances Certain medications for fungal infections like ketoconazole and itraconazole Certain medications for migraine headache like almotriptan, eletriptan, frovatriptan, naratriptan, rizatriptan, sumatriptan, zolmitriptan Certain medications for seizures like carbamazepine or phenytoin Certain medications for sleep Cimetidine Erythromycin Fentanyl Lithium Medications for blood pressure Nefazodone Rasagiline Rifampin Supplements like St. John's wort, kava kava, valerian Tramadol Tryptophan This list may not describe all possible interactions. Give your health care provider a list of all the medicines, herbs, non-prescription drugs, or dietary supplements you use. Also tell them if you smoke, drink alcohol, or use illegal drugs. Some items may interact with your medicine. What should I watch for while using this medication? Tell your care team if your symptoms do not get better or if they get worse. Visit your care team for regular checks on your progress. Because it may take several weeks to see the full effects of this medication, it is important to continue your treatment as prescribed by your doctor. Patients and their families should watch out for new or worsening thoughts of suicide or depression. Also watch out for sudden changes in feelings such as feeling anxious, agitated, panicky, irritable, hostile, aggressive, impulsive, severely restless, overly excited and hyperactive, or not being able to sleep. If this happens, especially at the beginning of treatment or after a change in dose, call your care team. You may get drowsy or dizzy. Do not drive, use machinery, or do anything that needs mental alertness until  you know how this medication affects you. Do not  stand or sit up quickly, especially if you are an older patient. This reduces the risk of dizzy or fainting spells. Alcohol may interfere with the effect of this medication. Avoid alcoholic drinks. This medication may cause dry eyes and blurred vision. If you wear contact lenses you may feel some discomfort. Lubricating drops may help. See your eye care team if the problem does not go away or is severe. Your mouth may get dry. Chewing sugarless gum or sucking hard candy, and drinking plenty of water may help. Contact your care team if the problem does not go away or is severe. What side effects may I notice from receiving this medication? Side effects that you should report to your care team as soon as possible: Allergic reactions--skin rash, itching, hives, swelling of the face, lips, tongue, or throat Heart rhythm changes--fast or irregular heartbeat, dizziness, feeling faint or lightheaded, chest pain, trouble breathing Infection--fever, chills, cough, or sore throat Irritability, confusion, fast or irregular heartbeat, muscle stiffness, twitching muscles, sweating, high fever, seizure, chills, vomiting, diarrhea, which may be signs of serotonin syndrome Low sodium level--muscle weakness, fatigue, dizziness, headache, confusion Rash, fever, and swollen lymph nodes Redness, blistering, peeling or loosening of the skin, including inside the mouth Seizures Sudden eye pain or change in vision such as blurry vision, seeing halos around lights, vision loss Thoughts of suicide or self-harm, worsening mood, feelings of depression Side effects that usually do not require medical attention (report to your care team if they continue or are bothersome): Constipation Dizziness Drowsiness Dry mouth Increase in appetite Weight gain This list may not describe all possible side effects. Call your doctor for medical advice about side effects. You may report side effects to FDA at 1-800-FDA-1088. Where should  I keep my medication? Keep out of the reach of children. Store at room temperature between 15 and 30 degrees C (59 and 86 degrees F) Protect from light and moisture. Throw away any unused medication after the expiration date. NOTE: This sheet is a summary. It may not cover all possible information. If you have questions about this medicine, talk to your doctor, pharmacist, or health care provider.  2023 Elsevier/Gold Standard (2020-12-20 00:00:00)

## 2022-07-08 NOTE — Progress Notes (Signed)
[facility] [facility] MD OP Progress Note  07/08/2022 12:21 PM Alison [name]  MRN:  [id number]  Chief Complaint:  Chief Complaint  Patient presents with   Follow-up   Anxiety   Depression   HPI: Alison [name] is a 46 year old Caucasian female, lives in [place], has a history of bipolar disorder, PTSD, multiple medical problems including fibromyalgia, chronic pain, trigeminal neuralgia, history of liver transplant, B-cell lymphoma, SLE, diabetes mellitus, stage IV chronic kidney disease, OSA, chronic migraine headaches, hyperlipidemia, hypertension was evaluated in office today.  Patient today reports she had a PET scan and was told she does not have cancer.  That is a relief.  Patient however reports she is currently worried about her father who is currently not doing well with regards to his health.  She is trying to visit him and support him.  This is a constant worry for her.  Patient also reports she is currently struggling with sleep problems.  The Lunesta does not seem to help.  The [place] makes her drowsy other than the Lunesta.  She takes the [place] at the end of the day.  Patient reports depressive symptoms otherwise has improved.  She does not feel as sad as she used to before other than her worry about her dad.  Denies any suicidality, homicidality or perceptual disturbances.  Denies any other concerns today.  Visit Diagnosis:    ICD-10-CM   1. Bipolar 1 disorder, depressed, partial remission ([place])  F31.75     2. PTSD (post-traumatic stress disorder)  F43.10 mirtazapine (REMERON) 7.5 MG tablet    3. GAD (generalized anxiety disorder)  F41.1 mirtazapine (REMERON) 7.5 MG tablet    4. Insomnia due to medical condition  G47.01 mirtazapine (REMERON) 7.5 MG tablet   mood      Past Psychiatric History: Reviewed past psychiatric history from progress note on 06/10/2019.  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   Chemo tx's   Migraine    Morbid obesity ([place])    Neuromuscular disorder ([place])    neuropathy   Neuropathy    Personality disorder ([place])    Pineal gland cyst    Post herpetic neuralgia    PTSD (post-traumatic stress disorder)    Renal disorder    S/P liver transplant ([place]) 1993   Sleep apnea    has not recieved cpap yet-other one was recalled    Past Surgical History:  Procedure Laterality Date   BONE MARROW BIOPSY  01/13/2015   BREAST BIOPSY  12/2014   BREAST BIOPSY  2011   BREAST SURGERY     CHOLECYSTECTOMY     COLONOSCOPY  COLONOSCOPY WITH PROPOFOL N/A 02/28/2022   Procedure: COLONOSCOPY WITH PROPOFOL;  Surgeon: [name], MD;  Location: [facility] ENDOSCOPY;  Service: Endoscopy;  Laterality: N/A;   ESOPHAGOGASTRODUODENOSCOPY     ESOPHAGOGASTRODUODENOSCOPY (EGD) WITH PROPOFOL N/A 01/09/2021   Procedure: ESOPHAGOGASTRODUODENOSCOPY (EGD) WITH PROPOFOL;  Surgeon: [name], MD;  Location: [facility] ENDOSCOPY;  Service: Endoscopy;  Laterality: N/A;   HERNIA REPAIR     x4-all from liver transplant   HYSTEROSCOPY WITH D & C N/A 11/13/2021   Procedure: DILATATION AND CURETTAGE /HYSTEROSCOPY;  Surgeon: Homero Fellers, MD;  Location: [facility] ORS;  Service: Gynecology;  Laterality: N/A;   infusaport     LIVER TRANSPLANT  12/17/1991   LUMBAR PUNCTURE     PORT A CATH  INJECTION ([place] HX)     RENAL BIOPSY     tumor removal  2015   cancer right side    Family Psychiatric History: Reviewed family psychiatric history from progress note on 06/10/2019.  Family History:  Family History  Problem Relation Age of Onset   Heart disease Mother    Hypertension Mother    Cancer - Other Mother    Bipolar disorder Mother    Heart disease Father    Hypertension Father    Diabetes Father    Parkinson's disease Maternal Grandmother    Cancer Maternal Aunt    Cancer Maternal Uncle    Cancer Maternal Grandfather    Lupus Paternal Grandmother    Hypertension Brother     Social History: Reviewed social history from progress note on 06/10/2019. Social History   Socioeconomic History   Marital status: Single    Spouse name: Not on file   Number of children: 0   Years of education: 9   Highest education level: GED or equivalent  Occupational History   Occupation: disabled  Tobacco Use   Smoking status: Never   Smokeless tobacco: Never  Vaping Use   Vaping Use: Never used  Substance and Sexual Activity   Alcohol use: Not Currently   Drug use: Not Currently    Comment: pain managment last used in early april   Sexual activity: Not Currently  Other Topics Concern   Not on file  Social History Narrative   Not on file   Social Determinants of Health   Financial Resource Strain: Low Risk  (04/23/2022)   Overall Financial Resource Strain (CARDIA)    Difficulty of Paying Living Expenses: Not very hard  Food Insecurity: No Food Insecurity (03/15/2022)   Hunger Vital Sign    Worried About Running Out of Food in the Last Year: Never true    Ran Out of Food in the Last Year: Never true  Transportation Needs: No Transportation Needs (03/15/2022)   PRAPARE - Hydrologist (Medical): No    Lack of Transportation (Non-Medical): No  Physical Activity: Sufficiently Active (04/23/2022)   Exercise Vital Sign    Days of Exercise per Week: 3  days    Minutes of Exercise per Session: 150+ min  Stress: Stress Concern Present (05/24/2022)   [place]    Feeling of Stress : Very much  Social Connections: Moderately Integrated (02/12/2022)   Social Connection and Isolation Panel [NHANES]    Frequency of Communication with Friends and Family: More than three times a week    Frequency of Social Gatherings with Friends and Family: More than three times a week    Attends Religious Services: More than  4 times per year    Active Member of Clubs or Organizations: No    Attends Archivist Meetings: Never    Marital Status: Living with partner    Allergies:  Allergies  Allergen Reactions   Penicillin G Itching, Rash, Anaphylaxis and Palpitations   Lamictal [Lamotrigine] Rash   Carbamazepine Other (See Comments)    Medication interaction-prograf    Hydrocodone-Acetaminophen Itching   Naproxen Itching   Penicillins     Metabolic Disorder Labs: Lab Results  Component Value Date   HGBA1C 6.4 03/06/2022   MPG 209 06/05/2020   Lab Results  Component Value Date   PROLACTIN 13.4 07/15/2019   PROLACTIN 21.3 05/24/2019   Lab Results  Component Value Date   CHOL 81 05/27/2022   TRIG 203 (H) 05/27/2022   HDL 35 (L) 05/27/2022   CHOLHDL 2.3 05/27/2022   LDLCALC 19 05/27/2022   LDLCALC 101 (H) 12/03/2021   Lab Results  Component Value Date   TSH 2.650 07/15/2019   TSH 4.650 (H) 05/24/2019    Therapeutic Level Labs: No results found for: "LITHIUM" No results found for: "VALPROATE" No results found for: "CBMZ"  Current Medications: Current Outpatient Medications  Medication Sig Dispense Refill   baclofen (LIORESAL) 10 MG tablet Take 10 mg by mouth 2 (two) times daily.     benztropine (COGENTIN) 1 MG tablet Take 1 tablet (1 mg total) by mouth daily as needed for tremors. 90 tablet 0   Cholecalciferol (VITAMIN D) 125 MCG (5000 UT) CAPS Take 5,000  Units by mouth daily.     CRANBERRY PO Take 1 capsule by mouth in the morning and at bedtime.     Dulaglutide 4.5 MG/0.5ML SOPN Inject 4.5 mg into the skin once a week.     ezetimibe (ZETIA) 10 MG tablet Take 10 mg by mouth every morning.     fluticasone (FLONASE) 50 MCG/ACT nasal spray Place 1 spray into both nostrils daily as needed for allergies.     fluticasone (FLONASE) 50 MCG/ACT nasal spray Place into the nose.     icosapent Ethyl (VASCEPA) 1 g capsule Take 2 capsules (2 g total) by mouth 2 (two) times daily. 120 capsule 5   lisinopril (ZESTRIL) 20 MG tablet Take 1 tablet (20 mg total) by mouth daily. 90 tablet 3   lurasidone (LATUDA) 20 MG TABS tablet Take 1 tablet (20 mg total) by mouth daily with supper. 90 tablet 0   Magnesium 500 MG CAPS Take 500 mg by mouth daily.     Melatonin 10 MG TABS Take 10 mg by mouth at bedtime.     metFORMIN (GLUCOPHAGE) 1000 MG tablet TAKE ONE TABLET BY MOUTH TWICE DAILY with meals 180 tablet 2   mirtazapine (REMERON) 7.5 MG tablet Take 1 tablet (7.5 mg total) by mouth at bedtime. 30 tablet 1   MYRBETRIQ 50 MG TB24 tablet TAKE ONE TABLET BY MOUTH ONCE DAILY 90 tablet 3   naloxone (NARCAN) nasal spray 4 mg/0.1 mL Place 0.4 mg into the nose once.     oxyCODONE ER (XTAMPZA ER) 13.5 MG C12A Take 13.5 mg by mouth 2 (two) times daily.     pregabalin (LYRICA) 200 MG capsule Take 1 capsule (200 mg total) by mouth 2 (two) times daily. 60 capsule 0   rosuvastatin (CRESTOR) 10 MG tablet Take 1 tablet (10 mg total) by mouth daily. 90 tablet 3   tacrolimus (PROGRAF) 1 MG capsule Take by mouth.     UBRELVY 50 MG  TABS Take 1 tablet by mouth daily as needed.     venlafaxine XR (EFFEXOR XR) 37.5 MG 24 hr capsule Take 1 capsule (37.5 mg total) by mouth daily with breakfast. Take along with 75 mg daily 90 capsule 0   venlafaxine XR (EFFEXOR XR) 75 MG 24 hr capsule Take 1 capsule (75 mg total) by mouth daily with breakfast. 90 capsule 0   XYLIMELTS 550 MG DISK Take 550 mg by  mouth every 8 (eight) hours as needed (dry mouth).     No current facility-administered medications for this visit.     Musculoskeletal: Strength & Muscle Tone: within normal limits Gait & Station: normal Patient leans: N/A  Psychiatric Specialty Exam: Review of Systems  Psychiatric/Behavioral:  Positive for sleep disturbance. The patient is nervous/anxious.   All other systems reviewed and are negative.   Blood pressure 101/68, pulse 65, temperature 97.8 F (36.6 C), temperature source Temporal, weight 244 lb 12.8 oz (111 kg).Body mass index is 39.51 kg/m.  General Appearance: Casual  Eye Contact:  Fair  Speech:  Clear and Coherent  Volume:  Normal  Mood:  Anxious  Affect:  Congruent  Thought Process:  Goal Directed and Descriptions of Associations: Intact  Orientation:  Full (Time, Place, and Person)  Thought Content: Logical   Suicidal Thoughts:  No  Homicidal Thoughts:  No  Memory:  Immediate;   Fair Recent;   Fair Remote;   Fair  Judgement:  Fair  Insight:  Fair  Psychomotor Activity:  Normal  Concentration:  Concentration: Fair and Attention Span: Fair  Recall:  [employer] of Knowledge: Fair  Language: Fair  Akathisia:  No  Handed:  Right  AIMS (if indicated): done  Assets:  Communication Skills Desire for Improvement Social Support Talents/Skills Transportation  ADL's:  Intact  Cognition: WNL  Sleep:  Poor   Screenings: [place] Office Visit from 07/08/2022 in [place] Video Visit from 03/18/2022 in [place] Video Visit from 02/14/2022 in [place] Office Visit from 01/21/2022 in [place] Video Visit from 05/09/2021 in [place] Total Score 0 0 0 0 [place] Visit from 07/08/2022 in [place] Office Visit from 05/27/2022 in  [facility] Video Visit from 03/18/2022 in [place] Video Visit from 02/14/2022 in [place] Office Visit from 01/25/2022 in [facility]  Total GAD-7 Score _0 PHQ2-9    [place] Visit from 07/08/2022 in [place] Office Visit from 05/27/2022 in [facility] Video Visit from 05/21/2022 in [place] Video Visit from 03/18/2022 in [place] Video Visit from 02/14/2022 in [place]  PHQ-2 Total Score _1 0 1  PHQ-9 Total Score _2 -- --      [place] Visit from 07/08/2022 in [place] Video Visit from 05/21/2022 in [place] Admission (Discharged) from 02/28/2022 in [place] No Risk        Assessment and Plan: BRYLEI PEDLEY is a 46 year old Caucasian female on disability, has a history of bipolar disorder, PTSD, history of borderline personality disorder, chronic pain, fibromyalgia,  SLE, diabetes, OSA, migraine headache was evaluated in office today.  Patient is currently struggling with anxiety, sleep problems, will benefit from the following plan.  Plan Bipolar disorder type I depressed in partial remission Latuda 20 mg p.o. daily with supper Venlafaxine 112.5 mg p.o. daily Cogentin 1 mg p.o. daily as needed for side effects.  GAD-unstable Venlafaxine extended release 112.5 mg p.o. daily Add mirtazapine 7.5 mg p.o. nightly Continue CBT as needed  PTSD-stable Venlafaxine as prescribed Continue CBT  Insomnia - Unstable Discontinue Lunesta for lack of benefit Add mirtazapine 7.5 mg p.o. nightly Reviewed [place] aware Continue CPAP   Follow-up in clinic in 3 to 4  weeks or sooner if needed.   This note was generated in part or whole with voice recognition software. Voice recognition is usually quite accurate but there are transcription errors that can and very often do occur. I apologize for any typographical errors that were not detected and corrected.     [name], MD 07/09/2022, 8:28 AM

## 2022-07-11 DIAGNOSIS — M797 Fibromyalgia: Secondary | ICD-10-CM | POA: Diagnosis not present

## 2022-07-11 DIAGNOSIS — K7581 Nonalcoholic steatohepatitis (NASH): Secondary | ICD-10-CM | POA: Diagnosis not present

## 2022-07-11 DIAGNOSIS — R52 Pain, unspecified: Secondary | ICD-10-CM | POA: Diagnosis not present

## 2022-07-11 DIAGNOSIS — Z6837 Body mass index (BMI) 37.0-37.9, adult: Secondary | ICD-10-CM | POA: Diagnosis not present

## 2022-07-11 DIAGNOSIS — Z944 Liver transplant status: Secondary | ICD-10-CM | POA: Diagnosis not present

## 2022-07-11 DIAGNOSIS — C8580 Other specified types of non-Hodgkin lymphoma, unspecified site: Secondary | ICD-10-CM | POA: Diagnosis not present

## 2022-07-11 DIAGNOSIS — F411 Generalized anxiety disorder: Secondary | ICD-10-CM | POA: Diagnosis not present

## 2022-07-11 DIAGNOSIS — I10 Essential (primary) hypertension: Secondary | ICD-10-CM | POA: Diagnosis not present

## 2022-07-11 DIAGNOSIS — E669 Obesity, unspecified: Secondary | ICD-10-CM | POA: Diagnosis not present

## 2022-07-11 DIAGNOSIS — E1165 Type 2 diabetes mellitus with hyperglycemia: Secondary | ICD-10-CM | POA: Diagnosis not present

## 2022-07-11 DIAGNOSIS — E7849 Other hyperlipidemia: Secondary | ICD-10-CM | POA: Diagnosis not present

## 2022-07-11 DIAGNOSIS — F3131 Bipolar disorder, current episode depressed, mild: Secondary | ICD-10-CM | POA: Diagnosis not present

## 2022-07-16 ENCOUNTER — Encounter: Payer: Self-pay | Admitting: Nurse Practitioner

## 2022-07-24 ENCOUNTER — Telehealth: Payer: Medicaid Other | Admitting: Psychiatry

## 2022-07-31 DIAGNOSIS — G893 Neoplasm related pain (acute) (chronic): Secondary | ICD-10-CM | POA: Diagnosis not present

## 2022-07-31 DIAGNOSIS — M79606 Pain in leg, unspecified: Secondary | ICD-10-CM | POA: Diagnosis not present

## 2022-07-31 DIAGNOSIS — Z79899 Other long term (current) drug therapy: Secondary | ICD-10-CM | POA: Diagnosis not present

## 2022-07-31 DIAGNOSIS — M25519 Pain in unspecified shoulder: Secondary | ICD-10-CM | POA: Diagnosis not present

## 2022-07-31 DIAGNOSIS — M5414 Radiculopathy, thoracic region: Secondary | ICD-10-CM | POA: Diagnosis not present

## 2022-07-31 DIAGNOSIS — M5442 Lumbago with sciatica, left side: Secondary | ICD-10-CM | POA: Diagnosis not present

## 2022-07-31 DIAGNOSIS — F3189 Other bipolar disorder: Secondary | ICD-10-CM | POA: Diagnosis not present

## 2022-07-31 DIAGNOSIS — G894 Chronic pain syndrome: Secondary | ICD-10-CM | POA: Diagnosis not present

## 2022-07-31 DIAGNOSIS — G8929 Other chronic pain: Secondary | ICD-10-CM | POA: Diagnosis not present

## 2022-07-31 DIAGNOSIS — G4733 Obstructive sleep apnea (adult) (pediatric): Secondary | ICD-10-CM | POA: Diagnosis not present

## 2022-07-31 DIAGNOSIS — F320 Major depressive disorder, single episode, mild: Secondary | ICD-10-CM | POA: Diagnosis not present

## 2022-07-31 DIAGNOSIS — M79603 Pain in arm, unspecified: Secondary | ICD-10-CM | POA: Diagnosis not present

## 2022-07-31 DIAGNOSIS — I10 Essential (primary) hypertension: Secondary | ICD-10-CM | POA: Diagnosis not present

## 2022-08-01 ENCOUNTER — Telehealth (INDEPENDENT_AMBULATORY_CARE_PROVIDER_SITE_OTHER): Payer: Medicaid Other | Admitting: Psychiatry

## 2022-08-01 ENCOUNTER — Encounter: Payer: Self-pay | Admitting: Psychiatry

## 2022-08-01 DIAGNOSIS — F4310 Post-traumatic stress disorder, unspecified: Secondary | ICD-10-CM | POA: Diagnosis not present

## 2022-08-01 DIAGNOSIS — F3176 Bipolar disorder, in full remission, most recent episode depressed: Secondary | ICD-10-CM

## 2022-08-01 DIAGNOSIS — G4701 Insomnia due to medical condition: Secondary | ICD-10-CM

## 2022-08-01 DIAGNOSIS — F411 Generalized anxiety disorder: Secondary | ICD-10-CM | POA: Diagnosis not present

## 2022-08-01 MED ORDER — LURASIDONE HCL 20 MG PO TABS: 20.0000 mg | ORAL_TABLET | Freq: Every day | ORAL | 0 refills | Status: DC

## 2022-08-01 MED ORDER — BENZTROPINE MESYLATE 1 MG PO TABS: 1.0000 mg | ORAL_TABLET | Freq: Every day | ORAL | 0 refills | Status: DC | PRN

## 2022-08-01 MED ORDER — VENLAFAXINE HCL ER 37.5 MG PO CP24: 37.5000 mg | ORAL_CAPSULE | Freq: Every day | ORAL | 0 refills | Status: DC

## 2022-08-01 MED ORDER — MIRTAZAPINE 15 MG PO TABS: 15.0000 mg | ORAL_TABLET | Freq: Every day | ORAL | 0 refills | Status: DC

## 2022-08-01 MED ORDER — VENLAFAXINE HCL ER 75 MG PO CP24: 75.0000 mg | ORAL_CAPSULE | Freq: Every day | ORAL | 0 refills | Status: DC

## 2022-08-01 NOTE — Progress Notes (Signed)
Virtual Visit via Video [name]  I connected with Alison [name] on 08/01/22 at  1:30 PM EDT by a video enabled telemedicine application and verified that I am speaking with the correct person using two identifiers.  Location Provider Location : [facility] Patient Location : Home  Participants: Patient , Provider   I discussed the limitations of evaluation and management by telemedicine and the availability of in person appointments. The patient expressed understanding and agreed to proceed.   I discussed the assessment and treatment plan with the patient. The patient was provided an opportunity to ask questions and all were answered. The patient agreed with the plan and demonstrated an understanding of the instructions.   The patient was advised to call back or seek an in-person evaluation if the symptoms worsen or if the condition fails to improve as anticipated.  Alison [name]  08/02/2022 1:36 PM Alison [name]  [name]:  [id number]  Chief Complaint:  Chief Complaint  Patient presents with   Follow-up   Anxiety   Depression   Medication Refill   HPI: Alison [name] is a 46 year old Caucasian female, lives in [place], has a history of bipolar disorder, PTSD, multiple medical problems including fibromyalgia, chronic pain, trigeminal neuralgia, history of liver transplant, B-cell lymphoma, SLE, diabetes mellitus, stage IV chronic kidney disease, OSA, chronic migraine headaches, hyperlipidemia, hypertension was evaluated by telemedicine today.  Patient today reports overall she is currently doing better with regards to her depression symptoms.  Continues to have anxiety although improving.  Patient continues to worry about her father who is currently struggling with medical problems.  She is planning to visit him beginning of November.  Looks forward to that.  Patient reports sleep continues to be restless.  Although the mirtazapine does help her to sleep she feels as though  sleep is light and any noise wakes her up.  She also takes melatonin low dosage with the mirtazapine.  Not sure if she is taking a 5 out of 10 mg.  Patient denies any suicidality, homicidality or perceptual disturbances.  Patient denies any other concerns today.  Visit Diagnosis:    ICD-10-CM   1. Bipolar 1 disorder, depressed, full remission (HCC)  F31.76 benztropine (COGENTIN) 1 MG tablet    lurasidone (LATUDA) 20 MG TABS tablet    2. PTSD (post-traumatic stress disorder)  F43.10 benztropine (COGENTIN) 1 MG tablet    lurasidone (LATUDA) 20 MG TABS tablet    mirtazapine (REMERON) 15 MG tablet    venlafaxine XR (EFFEXOR XR) 37.5 MG 24 hr capsule    venlafaxine XR (EFFEXOR XR) 75 MG 24 hr capsule    3. GAD (generalized anxiety disorder)  F41.1 benztropine (COGENTIN) 1 MG tablet    lurasidone (LATUDA) 20 MG TABS tablet    mirtazapine (REMERON) 15 MG tablet    venlafaxine XR (EFFEXOR XR) 37.5 MG 24 hr capsule    venlafaxine XR (EFFEXOR XR) 75 MG 24 hr capsule    4. Insomnia due to medical condition  G47.01 mirtazapine (REMERON) 15 MG tablet   Mood      Past Psychiatric History: Reviewed past psychiatric history from progress [name] on 06/10/2019.  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   Chemo tx's   Migraine     Morbid obesity (HCC)    Neuromuscular disorder (HCC)    neuropathy   Neuropathy    Personality disorder (HCC)    Pineal gland cyst    Post herpetic neuralgia    PTSD (post-traumatic stress disorder)    Renal disorder    S/P liver transplant ([place]) 1993   Sleep apnea    has not recieved cpap yet-other one was recalled    Past Surgical History:  Procedure Laterality Date   BONE MARROW BIOPSY  01/13/2015   BREAST BIOPSY  12/2014   BREAST BIOPSY  2011   BREAST SURGERY     CHOLECYSTECTOMY     COLONOSCOPY     COLONOSCOPY WITH PROPOFOL N/A 02/28/2022   Procedure: COLONOSCOPY WITH PROPOFOL;  Surgeon: [name], MD;  Location: [facility] ENDOSCOPY;  Service: Endoscopy;  Laterality: N/A;   ESOPHAGOGASTRODUODENOSCOPY     ESOPHAGOGASTRODUODENOSCOPY (EGD) WITH PROPOFOL N/A 01/09/2021   Procedure: ESOPHAGOGASTRODUODENOSCOPY (EGD) WITH PROPOFOL;  Surgeon: [name], MD;  Location: [facility] ENDOSCOPY;  Service: Endoscopy;  Laterality: N/A;   HERNIA REPAIR     x4-all from liver transplant   HYSTEROSCOPY WITH D & C N/A 11/13/2021   Procedure: DILATATION AND CURETTAGE /HYSTEROSCOPY;  Surgeon: Homero Fellers, MD;  Location: [facility] ORS;  Service: Gynecology;  Laterality: N/A;   infusaport     LIVER TRANSPLANT  12/17/1991   LUMBAR PUNCTURE     PORT A CATH INJECTION ([place] HX)     RENAL BIOPSY     tumor removal  2015   cancer right side    Family Psychiatric History: Reviewed family psychiatric history from progress [name] on 06/10/2019.  Family History:  Family History  Problem Relation Age of Onset   Heart disease Mother    Hypertension Mother    Cancer - Other Mother    Bipolar disorder Mother    Heart disease Father    Hypertension Father    Diabetes Father    Parkinson's disease Maternal Grandmother    Cancer Maternal Aunt    Cancer Maternal Uncle    Cancer Maternal Grandfather    Lupus Paternal Grandmother    Hypertension Brother     Social History: Reviewed social history from  progress [name] on 06/10/2019. Social History   Socioeconomic History   Marital status: Single    Spouse name: Not on file   Number of children: 0   Years of education: 9   Highest education level: GED or equivalent  Occupational History   Occupation: disabled  Tobacco Use   Smoking status: Never   Smokeless tobacco: Never  Vaping Use   Vaping Use: Never used  Substance and Sexual Activity   Alcohol use: Not Currently   Drug use: Not Currently    Comment: pain managment last used in early april   Sexual activity: Not Currently  Other Topics Concern  Not on file  Social History Narrative   Not on file   Social Determinants of Health   Financial Resource Strain: Low Risk  (04/23/2022)   Overall Financial Resource Strain (CARDIA)    Difficulty of Paying Living Expenses: Not very hard  Food Insecurity: No Food Insecurity (03/15/2022)   Hunger Vital Sign    Worried About Running Out of Food in the Last Year: Never true    Ran Out of Food in the Last Year: Never true  Transportation Needs: No Transportation Needs (03/15/2022)   PRAPARE - Hydrologist (Medical): No    Lack of Transportation (Non-Medical): No  Physical Activity: Sufficiently Active (04/23/2022)   Exercise Vital Sign    Days of Exercise per Week: 3 days    Minutes of Exercise per Session: 150+ min  Stress: Stress Concern Present (05/24/2022)   [place]    Feeling of Stress : Very much  Social Connections: Moderately Integrated (02/12/2022)   Social Connection and Isolation Panel [NHANES]    Frequency of Communication with Friends and Family: More than three times a week    Frequency of Social Gatherings with Friends and Family: More than three times a week    Attends Religious Services: More than 4 times per year    Active Member of [employer] or Organizations: No    Attends Archivist Meetings: Never    Marital  Status: Living with partner    Allergies:  Allergies  Allergen Reactions   Penicillin G Itching, Rash, Anaphylaxis and Palpitations   Lamictal [Lamotrigine] Rash   Carbamazepine Other (See Comments)    Medication interaction-prograf    Hydrocodone-Acetaminophen Itching   Naproxen Itching   Penicillins     Metabolic Disorder Labs: Lab Results  Component Value Date   HGBA1C 6.4 03/06/2022   MPG 209 06/05/2020   Lab Results  Component Value Date   PROLACTIN 13.4 07/15/2019   PROLACTIN 21.3 05/24/2019   Lab Results  Component Value Date   CHOL 81 05/27/2022   TRIG 203 (H) 05/27/2022   HDL 35 (L) 05/27/2022   CHOLHDL 2.3 05/27/2022   LDLCALC 19 05/27/2022   LDLCALC 101 (H) 12/03/2021   Lab Results  Component Value Date   TSH 2.650 07/15/2019   TSH 4.650 (H) 05/24/2019    Therapeutic Level Labs: No results found for: "LITHIUM" No results found for: "VALPROATE" No results found for: "CBMZ"  Current Medications: Current Outpatient Medications  Medication Sig Dispense Refill   baclofen (LIORESAL) 10 MG tablet Take 10 mg by mouth 2 (two) times daily.     Cholecalciferol (VITAMIN D) 125 MCG (5000 UT) CAPS Take 5,000 Units by mouth daily.     clindamycin (CLEOCIN) 300 MG capsule Take 300 mg by mouth every 6 (six) hours.     CRANBERRY PO Take 1 capsule by mouth in the morning and at bedtime.     Dulaglutide 4.5 MG/0.5ML SOPN Inject 4.5 mg into the skin once a week.     ezetimibe (ZETIA) 10 MG tablet Take 10 mg by mouth every morning.     fluticasone (FLONASE) 50 MCG/ACT nasal spray Place 1 spray into both nostrils daily as needed for allergies.     icosapent Ethyl (VASCEPA) 1 g capsule Take 2 capsules (2 g total) by mouth 2 (two) times daily. 120 capsule 5   lisinopril (ZESTRIL) 20 MG tablet Take 1 tablet (20 mg total) by mouth daily.  90 tablet 3   Magnesium 500 MG CAPS Take 500 mg by mouth daily.     Melatonin 10 MG TABS Take 10 mg by mouth at bedtime.     metFORMIN  (GLUCOPHAGE) 1000 MG tablet TAKE ONE TABLET BY MOUTH TWICE DAILY with meals 180 tablet 2   mirtazapine (REMERON) 15 MG tablet Take 1 tablet (15 mg total) by mouth at bedtime. 90 tablet 0   MYRBETRIQ 50 MG TB24 tablet TAKE ONE TABLET BY MOUTH ONCE DAILY 90 tablet 3   naloxone (NARCAN) nasal spray 4 mg/0.1 mL Place 0.4 mg into the nose once.     oxyCODONE ER (XTAMPZA ER) 13.5 MG C12A Take 13.5 mg by mouth 2 (two) times daily.     pregabalin (LYRICA) 200 MG capsule Take 1 capsule (200 mg total) by mouth 2 (two) times daily. 60 capsule 0   rosuvastatin (CRESTOR) 10 MG tablet Take 1 tablet (10 mg total) by mouth daily. 90 tablet 3   tacrolimus (PROGRAF) 1 MG capsule Take by mouth.     UBRELVY 50 MG TABS Take 1 tablet by mouth daily as needed.     XYLIMELTS 550 MG DISK Take 550 mg by mouth every 8 (eight) hours as needed (dry mouth).     benztropine (COGENTIN) 1 MG tablet Take 1 tablet (1 mg total) by mouth daily as needed for tremors. 90 tablet 0   fluticasone (FLONASE) 50 MCG/ACT nasal spray Place into the nose. (Patient not taking: Reported on 08/01/2022)     lurasidone (LATUDA) 20 MG TABS tablet Take 1 tablet (20 mg total) by mouth daily with supper. 90 tablet 0   venlafaxine XR (EFFEXOR XR) 37.5 MG 24 hr capsule Take 1 capsule (37.5 mg total) by mouth daily with breakfast. Take along with 75 mg daily 90 capsule 0   venlafaxine XR (EFFEXOR XR) 75 MG 24 hr capsule Take 1 capsule (75 mg total) by mouth daily with breakfast. 90 capsule 0   No current facility-administered medications for this visit.     Musculoskeletal: Strength & Muscle Tone:  UTA Gait & Station:  Laying in bed Patient leans: N/A  Psychiatric Specialty Exam: Review of Systems  Psychiatric/Behavioral:  Positive for sleep disturbance. The patient is nervous/anxious.   All other systems reviewed and are negative.   There were no vitals taken for this visit.There is no height or weight on file to calculate BMI.  General  Appearance: Casual  Eye Contact:  Fair  Speech:  Clear and Coherent  Volume:  Normal  Mood:  Anxious improving  Affect:  Congruent  Thought Process:  Goal Directed and Descriptions of Associations: Intact  Orientation:  Full (Time, Place, and Person)  Thought Content: Logical   Suicidal Thoughts:  No  Homicidal Thoughts:  No  Memory:  Immediate;   Fair Recent;   Fair Remote;   Fair  Judgement:  Fair  Insight:  Fair  Psychomotor Activity:  Normal  Concentration:  Concentration: Fair and Attention Span: Fair  Recall:  [employer] of Knowledge: Fair  Language: Fair  Akathisia:  No  Handed:  Right  AIMS (if indicated): not done  Assets:  Communication Skills Desire for Improvement Housing Social Support  ADL's:  Intact  Cognition: WNL  Sleep:   restless   Screenings: [place] Office Visit from 07/08/2022 in [place] Video Visit from 03/18/2022 in [place] Video Visit from 02/14/2022 in [place]  Office Visit from 01/21/2022 in [place] Video Visit from 05/09/2021 in [place] Total Score 0 0 0 0 0      GAD-7    Flowsheet Row Video Visit from 08/01/2022 in [place] Office Visit from 07/08/2022 in [place] Office Visit from 05/27/2022 in [facility] Video Visit from 03/18/2022 in [place] Video Visit from 02/14/2022 in [place]  Total GAD-7 Score 4 11 20 3 10       PHQ2-9    Flowsheet Row Video Visit from 08/01/2022 in [place] Office Visit from 07/08/2022 in [place] Office Visit from 05/27/2022 in [facility] Video Visit from 05/21/2022 in [place] Video  Visit from 03/18/2022 in [place]  PHQ-2 Total Score 1 4 6 5  0  PHQ-9 Total Score 7 15 23 17  --      Flowsheet Row Video Visit from 08/01/2022 in [place] Office Visit from 07/08/2022 in [place] Video Visit from 05/21/2022 in [place] Low Risk Low Risk Low Risk        Assessment and Plan: Alison [name] is a 46 year old Caucasian female on disability, has a history of bipolar disorder, PTSD, history of borderline personality disorder, chronic pain, fibromyalgia, SLE, diabetes, OSA, migraine headache was evaluated by telemedicine today.  Patient with improvement in her mood symptoms continues to struggle with sleep, will benefit from the following plan.  Plan  Bipolar disorder type I depressed in remission Latuda 20 mg p.o. daily with supper Venlafaxine 112.5 mg p.o. daily Cogentin 1 mg p.o. daily as needed for side effects.  GAD-improving Venlafaxine as prescribed Increase mirtazapine to 15 mg p.o. nightly Continue CBT as needed  PTSD-stable Venlafaxine as prescribed Continue CBT  Insomnia-unstable Increase mirtazapine to 15 mg p.o. nightly Continue CPAP Patient to continue melatonin 5 to 10 mg p.o. nightly Continue sleep hygiene techniques  Follow-up in clinic in 6 weeks or sooner if needed.    Consent: Patient/Guardian gives verbal consent for treatment and assignment of benefits for services provided during this visit. Patient/Guardian expressed understanding and agreed to proceed.   This [name] was generated in part or whole with voice recognition software. Voice recognition is usually quite accurate but there are transcription errors that can and very often do occur. I apologize for any typographical errors that were not detected and corrected.      [name], MD 08/02/2022, 1:36 PM

## 2022-08-05 ENCOUNTER — Encounter (INDEPENDENT_AMBULATORY_CARE_PROVIDER_SITE_OTHER): Payer: Self-pay

## 2022-08-07 ENCOUNTER — Other Ambulatory Visit: Payer: Self-pay | Admitting: Obstetrics and Gynecology

## 2022-08-07 DIAGNOSIS — F4310 Post-traumatic stress disorder, unspecified: Secondary | ICD-10-CM | POA: Diagnosis not present

## 2022-08-07 DIAGNOSIS — F411 Generalized anxiety disorder: Secondary | ICD-10-CM | POA: Diagnosis not present

## 2022-08-07 DIAGNOSIS — F3181 Bipolar II disorder: Secondary | ICD-10-CM | POA: Diagnosis not present

## 2022-08-07 NOTE — Patient Outreach (Signed)
Care Coordination  08/07/2022  KAMREE WIENS 11/03/1975 [id number]  RNCM called patient at scheduled time.  Patient answered phone and stated she was on a video call with a provider.  RNCM rescheduled appointment.  [name], [name] [place]  [facility] - Managed Medicaid High Risk [phone].

## 2022-08-08 ENCOUNTER — Other Ambulatory Visit: Payer: Self-pay | Admitting: Psychiatry

## 2022-08-08 DIAGNOSIS — F411 Generalized anxiety disorder: Secondary | ICD-10-CM

## 2022-08-08 DIAGNOSIS — F4310 Post-traumatic stress disorder, unspecified: Secondary | ICD-10-CM

## 2022-08-08 DIAGNOSIS — G4701 Insomnia due to medical condition: Secondary | ICD-10-CM

## 2022-09-04 DIAGNOSIS — F3189 Other bipolar disorder: Secondary | ICD-10-CM | POA: Diagnosis not present

## 2022-09-04 DIAGNOSIS — M79606 Pain in leg, unspecified: Secondary | ICD-10-CM | POA: Diagnosis not present

## 2022-09-04 DIAGNOSIS — M79603 Pain in arm, unspecified: Secondary | ICD-10-CM | POA: Diagnosis not present

## 2022-09-04 DIAGNOSIS — I10 Essential (primary) hypertension: Secondary | ICD-10-CM | POA: Diagnosis not present

## 2022-09-04 DIAGNOSIS — G894 Chronic pain syndrome: Secondary | ICD-10-CM | POA: Diagnosis not present

## 2022-09-04 DIAGNOSIS — M5442 Lumbago with sciatica, left side: Secondary | ICD-10-CM | POA: Diagnosis not present

## 2022-09-04 DIAGNOSIS — M25519 Pain in unspecified shoulder: Secondary | ICD-10-CM | POA: Diagnosis not present

## 2022-09-04 DIAGNOSIS — F320 Major depressive disorder, single episode, mild: Secondary | ICD-10-CM | POA: Diagnosis not present

## 2022-09-04 DIAGNOSIS — G8929 Other chronic pain: Secondary | ICD-10-CM | POA: Diagnosis not present

## 2022-09-04 DIAGNOSIS — M5414 Radiculopathy, thoracic region: Secondary | ICD-10-CM | POA: Diagnosis not present

## 2022-09-04 DIAGNOSIS — G893 Neoplasm related pain (acute) (chronic): Secondary | ICD-10-CM | POA: Diagnosis not present

## 2022-09-07 ENCOUNTER — Other Ambulatory Visit: Payer: Self-pay | Admitting: Family Medicine

## 2022-09-07 DIAGNOSIS — I10 Essential (primary) hypertension: Secondary | ICD-10-CM

## 2022-09-07 DIAGNOSIS — E1165 Type 2 diabetes mellitus with hyperglycemia: Secondary | ICD-10-CM

## 2022-09-09 DIAGNOSIS — G43119 Migraine with aura, intractable, without status migrainosus: Secondary | ICD-10-CM | POA: Diagnosis not present

## 2022-09-09 DIAGNOSIS — G500 Trigeminal neuralgia: Secondary | ICD-10-CM | POA: Diagnosis not present

## 2022-09-09 DIAGNOSIS — G2111 Neuroleptic induced parkinsonism: Secondary | ICD-10-CM | POA: Diagnosis not present

## 2022-09-09 DIAGNOSIS — M797 Fibromyalgia: Secondary | ICD-10-CM | POA: Diagnosis not present

## 2022-09-09 DIAGNOSIS — T43505A Adverse effect of unspecified antipsychotics and neuroleptics, initial encounter: Secondary | ICD-10-CM | POA: Diagnosis not present

## 2022-09-10 ENCOUNTER — Other Ambulatory Visit: Payer: Medicaid Other | Admitting: Obstetrics and Gynecology

## 2022-09-10 ENCOUNTER — Encounter: Payer: Self-pay | Admitting: Obstetrics and Gynecology

## 2022-09-10 NOTE — Patient Outreach (Signed)
Medicaid Managed Care   Nurse Care Manager Note  09/10/2022 Name:  Alison [name] MRN:  [id number] DOB:  09/15/1976  Alison [name] is an 46 y.o. year old female who is a primary patient of Bo Merino, FNP.  The [facility] Managed Care Coordination team was consulted for assistance with:    Chronic healthcare management needs, DM, HTN, chronic pain, OSD, migraines, anxiety/depression, bipolar/PTSD, fibromyalgia, SLE, LDD, HLD, osteoarthritis  Ms. Perot was given information about Medicaid Managed Care Coordination team services today. [name] Patient agreed to services and verbal consent obtained.  Engaged with patient by telephone for follow up visit in response to provider referral for case management and/or care coordination services.   Assessments/Interventions:  Review of past medical history, allergies, medications, health status, including review of consultants reports, laboratory and other test data, was performed as part of comprehensive evaluation and provision of chronic care management services.  SDOH (Social Determinants of Health) assessments and interventions performed: SDOH Interventions    Flowsheet Row Patient Outreach Telephone from 09/10/2022 in [place] Patient Outreach Telephone from 08/07/2022 in [place] Patient Outreach Telephone from 07/04/2022 in [place] Patient Outreach from 05/24/2022 in [place] Patient Outreach Telephone from 04/23/2022 in [place] Patient Outreach Telephone from 03/19/2022 in [place] Interventions        Food Insecurity Interventions -- Intervention Not Indicated -- -- -- --  Housing Interventions -- -- Intervention Not Indicated -- -- --  Transportation Interventions --  Intervention Not Indicated -- -- -- --  Utilities Interventions -- -- Intervention Not Indicated -- -- --  Financial Strain Interventions -- -- -- -- Intervention Not Indicated Intervention Not Indicated  Physical Activity Interventions -- -- -- -- Intervention Not Indicated --  Stress Interventions -- -- -- Provide Counseling  [patient sees Psychiatrist] -- --  Social Connections Interventions Intervention Not Indicated -- -- -- -- --     Care Plan  Allergies  Allergen Reactions   Penicillin G Itching, Rash, Anaphylaxis and Palpitations   Lamictal [Lamotrigine] Rash   Carbamazepine Other (See Comments)    Medication interaction-prograf    Hydrocodone-Acetaminophen Itching   Naproxen Itching   Penicillins    Medications Reviewed Today     Reviewed by [name], RN (Registered Nurse) on 09/10/22 at [place] List Status: <None>   Medication Order Taking? Sig Documenting Provider Last Dose Status Informant  baclofen (LIORESAL) 10 MG tablet [id number]  Take 10 mg by mouth 2 (two) times daily. [provider]  Active Self  benztropine (COGENTIN) 1 MG tablet [id number]  Take 1 tablet (1 mg total) by mouth daily as needed for tremors. [name], MD  Active   Cholecalciferol (VITAMIN D) 125 MCG (5000 UT) CAPS [id number]  Take 5,000 Units by mouth daily. [provider]  Active Self  clindamycin (CLEOCIN) 300 MG capsule [id number]  Take 300 mg by mouth every 6 (six) hours. [provider]  Active   CRANBERRY PO [id number]  Take 1 capsule by mouth in the morning and at bedtime. [provider]  Active Self  dronabinol (MARINOL) 2.5 MG capsule [id number] Yes Take 2.5 mg by mouth 2 (two) times daily before a meal. Takes every other night [provider]  Active Self  Dulaglutide 4.5 MG/0.5ML SOPN [id number]  Inject 4.5 mg into the  skin once a week. [provider]  Active   ezetimibe (ZETIA) 10 MG tablet [id number]  Take 10 mg by mouth  every morning. [provider]  Active Self  fluticasone (FLONASE) 50 MCG/ACT nasal spray [id number]  Place 1 spray into both nostrils daily as needed for allergies. [provider]  Active Self  fluticasone (FLONASE) 50 MCG/ACT nasal spray [id number]  Place into the nose.  Patient not taking: Reported on 08/01/2022   [provider]  Active   icosapent Ethyl (VASCEPA) 1 g capsule [id number]  Take 2 capsules (2 g total) by mouth 2 (two) times daily. [name], MD  Active Self           Med Note Jodi Mourning, [place] Mar 19, 2022 10:24 AM) Taking 1 capsule twice daily per Dr. Ronnald Collum  lisinopril (ZESTRIL) 20 MG tablet [id number]  TAKE ONE TABLET BY MOUTH ONCE DAILY Bo Merino, FNP  Active   lurasidone (LATUDA) 20 MG TABS tablet [id number]  Take 1 tablet (20 mg total) by mouth daily with supper. [name], MD  Active   Magnesium 500 MG CAPS [id number]  Take 500 mg by mouth daily. [provider]  Active Self  Melatonin 10 MG TABS [id number]  Take 10 mg by mouth at bedtime. [provider]  Active Self  metFORMIN (GLUCOPHAGE) 1000 MG tablet [id number]  TAKE ONE TABLET BY MOUTH TWICE DAILY with meals Bo Merino, FNP  Active   mirtazapine (REMERON) 15 MG tablet [id number]  Take 1 tablet (15 mg total) by mouth at bedtime. [name], MD  Active   MYRBETRIQ 50 MG TB24 tablet [id number]  TAKE ONE TABLET BY MOUTH ONCE DAILY Vaillancourt, Samantha, PA-C  Active   naloxone [facility]) nasal spray 4 mg/0.1 mL [id number]  Place 0.4 mg into the nose once. [provider]  Active Self  oxyCODONE ER [facility] ER) 13.5 MG C12A [id number]  Take 9 mg by mouth 2 (two) times daily. [provider]  Active Self  pregabalin (LYRICA) 200 MG capsule [id number]  Take 1 capsule (200 mg total) by mouth 2 (two) times daily. [name], MD  Active   rosuvastatin (CRESTOR) 10 MG tablet [id number]  Take 1 tablet (10 mg total) by mouth daily.  [name], MD  Active   tacrolimus (PROGRAF) 1 MG capsule [id number]  Take by mouth. [provider]  Active   UBRELVY 50 MG TABS [id number]  Take 1 tablet by mouth daily as needed. [provider]  Active   venlafaxine XR (EFFEXOR XR) 37.5 MG 24 hr capsule [id number]  Take 1 capsule (37.5 mg total) by mouth daily with breakfast. Take along with 75 mg daily [name], [name], MD  Active   venlafaxine XR (EFFEXOR XR) 75 MG 24 hr capsule [id number]  Take 1 capsule (75 mg total) by mouth daily with breakfast. [name], MD  Active   XYLIMELTS 550 MG DISK [id number]  Take 550 mg by mouth every 8 (eight) hours as needed (dry mouth). [provider]  Active Self           Patient Active Problem List   Diagnosis Date Noted   Bipolar 1 disorder, depressed, moderate ([place]) 07/08/2022   Sjogren's syndrome ([place]) 05/27/2022   Encounter for screening colonoscopy    Menorrhagia with irregular cycle    Akathisia 10/24/2021   At risk for prolonged QT interval syndrome 09/12/2021   Prolonged Q-T interval on ECG 08/29/2021   Insomnia 07/20/2021  Bipolar disorder, in full remission, most recent episode mixed ([place]) 07/20/2021   Bipolar 1 disorder, mixed, mild ([place]) 05/09/2021   GAD (generalized anxiety disorder) 05/09/2021   Hepatic cirrhosis ([place])    Opioid dependence with opioid-induced disorder ([place]) 12/20/2020   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[place]) 12/20/2020   Bipolar 1 disorder, depressed, full remission ([place]) 09/27/2020   Suprapubic pain 07/25/2020   Bipolar 1 disorder, depressed, mild ([place]) 07/10/2020   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   Edema of lower extremity 09/16/2019   Carpal tunnel syndrome, left 07/26/2019   Cubital tunnel syndrome on left 07/26/2019   Bipolar I disorder, most recent episode depressed ([place]) 07/12/2019   PTSD (post-traumatic stress disorder) 25/49/8264   Umbilical hernia without obstruction and  without gangrene 06/17/2019   Encounter for surveillance of abnormal nevi 06/17/2019   Pineal gland cyst 06/17/2019   Chronic hip pain, left 06/02/2019   Lumbar spondylosis 06/02/2019   Mouth dryness 06/02/2019   Obesity (BMI 35.0-39.9 without comorbidity) 06/02/2019   Goals of care, counseling/discussion 05/18/2019   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) 03/23/2019   Essential hypertension 03/23/2019   Hepatitis 03/23/2019   Mass of left kidney 03/23/2019   Diabetes mellitus ([place]) 03/23/2019   Nausea without vomiting 03/23/2019   Neuropathy 03/23/2019   Cancer ([place]) 03/23/2019   Hyperlipidemia 03/23/2019   Osteoarthritis 03/23/2019   Trigeminal neuralgia 03/23/2019   Chronic renal disease, stage III ([place]) 03/23/2019   Nephrolithiasis 03/23/2019   Migraine 03/23/2019   OSA on CPAP 03/23/2019   Fibrocystic breast 03/23/2019   Heart murmur 03/23/2019   Bipolar disorder, in partial remission, most recent episode depressed ([place]) 07/02/2017   Abnormal uterine bleeding 03/18/2017   Major depressive disorder, recurrent ([place]) 03/18/2017   Morbid obesity ([place]) 03/18/2017   Secondary hyperparathyroidism ([place]) 02/07/2017   Neuropathic pain 11/19/2016   Post herpetic neuralgia 11/19/2016   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[place]) 01/13/2015   Lumbar disc disease with radiculopathy 07/26/2013   S/P liver transplant ([place]) 07/26/2013   Type 2 diabetes mellitus, uncontrolled 07/26/2013   Facial nerve disorder 06/29/2012   Low back pain 12/26/2010   Conditions to be addressed/monitored per PCP order:  Chronic healthcare management needs, DM, HTN, chronic pain, OSD, migraines, anxiety/depression, bipolar/PTSD, fibromyalgia,  SLE, LDD, HLD, osteoarthritis, LBP, DLBCL, h/o liver transplant  Care Plan : Chronic Pain (Adult)  Updates made by [name], RN since 09/10/2022 12:00 AM     Problem: Chronic Pain Management-fibromyalgia   Priority: High  Onset Date: 01/22/2021     Long-Range Goal: Fibromyalgia pain managed-new pain management provider   Start Date: 08/14/2020  Expected End Date: 12/10/2022  Recent Progress: On track  Priority: High  Note:   Current Barriers:  Knowledge deficits related to pain management  09/10/22:  Pain controlled right now per patient-Dr. Humphrey Rolls recently added Marinol and has helped her pain and facial pain.  Nurse Case Manager Clinical Goal(s):  Over the next 45 days, patient will work with [facility] to address needs related to referral for pain management provider and associated care coordination needs. 05/24/22:  patient is currently seeing Dr. Humphrey Rolls at [place] [facility].  Interventions:  Inter-disciplinary care team collaboration (see longitudinal plan of care) Evaluation of current treatment  plan related to fibromyalgia  and patient's adherence to plan as established by provider. Update 06/15/21:  Patient taking Baclofen again, Effexor added. Collaborated with primary care provider regarding recommendations and referral to pain management provider. Discussed plans with patient for ongoing care management follow up and provided patient with direct contact information for care management team Anticipate pain education program, pain management support as part of pain management referral.       Patient Goals/Self-Care Activities Over the next 45 days, patient will:  -Attends all scheduled provider appointments  develop a personal pain management plan with your pain management provider.  Follow Up Plan:  The Managed Medicaid care management team will reach out to the patient again over the next 30  business days.   Care Plan : Wellness (Adult)  Updates made by [name], RN since 09/10/2022 12:00 AM     Problem: Medication Adherence (Wellness)   Priority: High  Onset Date: 01/22/2021     Long-Range Goal: Medication Adherence Maintained   Start Date: 09/06/2020  Expected End Date: 12/10/2022  Recent Progress: On track  Priority: High  Note:   Current Barriers:  Chronic Disease Management of chronic health conditions 09/10/22:  patient with no complaints today-pain improved with Marinol.  Currently not checking blood sugars, BP 110-120/70-80.  Seen and evaluated by [facility]URO yesterday-has [facility] appt tomorrow.  Had 4 remaing teeth pulled end of October and needs resources for dentures-scheduled appt with [name] for resources. Nurse Case Manager Clinical Goal(s):  Over the next 30 days, patient will work with CM team pharmacist to review current medications. Update 01/22/21:  Patient met with Pharmacist and continues to follow. Over the next 30 days, patient will meet with Nutritionist and check her blood sugars. Over the next 30 days, patient will attend all scheduled appointments.  Interventions:  Inter-disciplinary care team collaboration (see longitudinal plan of care) Evaluation of current treatment plan and patient's adherence to plan as established by provider. Advised patient to contact her PCP for any medication needs. Reviewed medications with patient. Collaborated with pharmacy regarding medications.  Discussed plans with patient for ongoing care management follow up and provided patient with direct contact information for care management team Pharmacy referral for medication review. Patient given phone number for Healthy [facility] transportation if needed. Will notify PCP of need for testing strips and dietician referral. Collaborated with PCP for cardiology appointment. Collaborated with [name] for dental resources. [name] referral for dental resources  Patient Goals/Self-Care Activities Over the next 30 days, patient will:  -Patient  will take medications as prescribed. Calls pharmacy for medication refills Calls provider office for new concerns or questions  Follow Up Plan: The Managed Medicaid care management team will reach out to the patient again over the next 30 business days.  The patient has been provided with contact information for the Managed Medicaid care management team and has been advised to call with any health related questions or concerns.    Follow Up:  Patient agrees to Care Plan and Follow-up.  Plan: The Managed Medicaid care management team will reach out to the patient again over the next 30 business  days. and The  Patient has been provided with contact information for the Managed Medicaid care management team and has been advised to call with any health related questions or concerns.  Date/time of next scheduled RN care management/care coordination outreach:  10/15/22 at [phone].

## 2022-09-10 NOTE — Patient Instructions (Signed)
Hi Alison [name] you are doing well-have a nice afternoon!!  Alison [name] was given information about Medicaid Managed Care team care coordination services as a part of their Healthy [facility] Medicaid benefit. Alison [name] verbally consented to engagement with the [facility] Managed Care team.   If you are experiencing a medical emergency, please call 911 or report to your local emergency department or urgent care.   If you have a non-emergency medical problem during routine business hours, please contact your provider's office and ask to speak with a nurse.   For questions related to your Healthy [facility] health plan, please call: [phone] or visit the homepage here: [email or web address]  If you would like to schedule transportation through your Healthy [facility] plan, please call the following number at least 2 days in advance of your appointment: [phone]  For information about your ride after you set it up, call Ride Assist at [phone]. Use this number to activate a Will Call pickup, or if your transportation is late for a scheduled pickup. Use this number, too, if you need to make a change or cancel a previously scheduled reservation.  If you need transportation services right away, call [phone]. The after-hours call center is staffed 24 hours to handle ride assistance and urgent reservation requests (including discharges) 365 days a year. Urgent trips include sick visits, hospital discharge requests and life-sustaining treatment.  Call the [place] at [phone], at any time, 24 hours a day, 7 days a week. If you are in danger or need immediate medical attention call 911.  If you would like help to quit smoking, call 1-800-QUIT-NOW 662-756-0243) OR Espaol: 1-855-Djelo-Ya (3-013-143-8887) o para ms informacin haga clic aqu or Text READY to 200-400 to register via text  Alison [name] - following are the  goals we discussed in your visit today:   Goals Addressed             This Visit's Progress    Chronic Pain Managed       09/10/22:  patient seen and evaluated by Dr. Humphrey Rolls recently and Marinol added which has helped pain.    Get sugars at goal       Timeframe:  Short-Term Goal Priority:  High Start Date:                             Expected End Date:      ongoing                 Follow Up Date: 10/15/22   - call for medicine refill 2 or 3 days before it runs out - call if I am sick and can't take my medicine - keep a list of all the medicines I take; vitamins and herbals too    Why is this important?   These steps will help you keep on track with your medicines.   09/10/22:  Patient not currently checking blood sugars-last reading that she recalls was 230   Patient verbalizes understanding of instructions and care plan provided today and agrees to view in [place]. Active MyChart status and patient understanding of how to access instructions and care plan via MyChart confirmed with patient.     The Managed Medicaid care management team will reach out to the patient again over the next 30 business  days.  The  Patient has been provided with contact information for the Managed Medicaid care management team and has  been advised to call with any health related questions or concerns.     RN, [name] [place]  [facility] Care Management Coordinator - Managed Medicaid High Risk [phone]   Following is a copy of your plan of care:  Care Plan : Chronic Pain (Adult)  Updates made by ,  G, RN since 09/10/2022 12:00 AM     Problem: Chronic Pain Management-fibromyalgia   Priority: High  Onset Date: 01/22/2021     Long-Range Goal: Fibromyalgia pain managed-new pain management provider   Start Date: 08/14/2020  Expected End Date: 12/10/2022  Recent Progress: On track  Priority: High  Note:   Current Barriers:  Knowledge deficits related to pain  management  09/10/22:  Pain controlled right now per patient-Dr. Khan recently added Marinol and has helped her pain and facial pain.  Nurse Case Manager Clinical Goal(s):  Over the next 45 days, patient will work with RNCM to address needs related to referral for pain management provider and associated care coordination needs. 05/24/22:  patient is currently seeing Dr. Khan at [facility].  Interventions:  Inter-disciplinary care team collaboration (see longitudinal plan of care) Evaluation of current treatment plan related to fibromyalgia  and patient's adherence to plan as established by provider. Update 06/15/21:  Patient taking Baclofen again, Effexor added. Collaborated with primary care provider regarding recommendations and referral to pain management provider. Discussed plans with patient for ongoing care management follow up and provided patient with direct contact information for care management team Anticipate pain education program, pain management support as part of pain management referral.       Patient Goals/Self-Care Activities Over the next 45 days, patient will:  -Attends all scheduled provider appointments  develop a personal pain management plan with your pain management provider.  Follow Up Plan:  The Managed Medicaid care management team will reach out to the patient again over the next 30  business days.   Care Plan : Wellness (Adult)  Updates made by ,  G, RN since 09/10/2022 12:00 AM     Problem: Medication Adherence (Wellness)   Priority: High  Onset Date: 01/22/2021     Long-Range Goal: Medication Adherence Maintained   Start Date: 09/06/2020  Expected End Date: 12/10/2022  Recent Progress: On track  Priority: High  Note:   Current Barriers:  Chronic Disease Management of chronic health conditions 09/10/22:  patient with no complaints today-pain improved with Marinol.  Currently not checking blood sugars, BP 110-120/70-80.   Seen and evaluated by [facility] yesterday-has [facility] appt tomorrow.  Had 4 remaing teeth pulled end of October and needs resources for dentures-scheduled appt with [name] for resources. Nurse Case Manager Clinical Goal(s):  Over the next 30 days, patient will work with CM team pharmacist to review current medications. Update 01/22/21:  Patient met with Pharmacist and continues to follow. Over the next 30 days, patient will meet with Nutritionist and check her blood sugars. Over the next 30 days, patient will attend all scheduled appointments.  Interventions:  Inter-disciplinary care team collaboration (see longitudinal plan of care) Evaluation of current treatment plan and patient's adherence to plan as established by provider. Advised patient to contact her PCP for any medication needs. Reviewed medications with patient. Collaborated with pharmacy regarding medications.  Discussed plans with patient for ongoing care management follow up and provided patient with direct contact information for care management team Pharmacy referral for medication review. Patient given phone number for Healthy [employer] Managed Medicaid transportation if   needed. Will notify PCP of need for testing strips and dietician referral. Collaborated with PCP for cardiology appointment. Collaborated with [name] for dental resources. [name] referral for dental resources  Patient Goals/Self-Care Activities Over the next 30 days, patient will:  -Patient will take medications as prescribed. Calls pharmacy for medication refills Calls provider office for new concerns or questions  Follow Up Plan: The Managed Medicaid care management team will reach out to the patient again over the next 30 business days.  The patient has been provided with contact information for the Managed Medicaid care management team and has been advised to call with any health related questions or concerns.

## 2022-09-11 DIAGNOSIS — F3181 Bipolar II disorder: Secondary | ICD-10-CM | POA: Diagnosis not present

## 2022-09-11 DIAGNOSIS — F4310 Post-traumatic stress disorder, unspecified: Secondary | ICD-10-CM | POA: Diagnosis not present

## 2022-09-11 DIAGNOSIS — F411 Generalized anxiety disorder: Secondary | ICD-10-CM | POA: Diagnosis not present

## 2022-09-12 ENCOUNTER — Other Ambulatory Visit: Payer: Medicaid Other

## 2022-09-12 ENCOUNTER — Encounter: Payer: Self-pay | Admitting: Psychiatry

## 2022-09-12 ENCOUNTER — Ambulatory Visit (INDEPENDENT_AMBULATORY_CARE_PROVIDER_SITE_OTHER): Payer: Medicaid Other | Admitting: Psychiatry

## 2022-09-12 VITALS — BP 143/98 | HR 103 | Temp 97.8°F | Ht 66.0 in | Wt 256.4 lb

## 2022-09-12 DIAGNOSIS — F3176 Bipolar disorder, in full remission, most recent episode depressed: Secondary | ICD-10-CM | POA: Diagnosis not present

## 2022-09-12 DIAGNOSIS — G4701 Insomnia due to medical condition: Secondary | ICD-10-CM | POA: Diagnosis not present

## 2022-09-12 DIAGNOSIS — F4310 Post-traumatic stress disorder, unspecified: Secondary | ICD-10-CM | POA: Diagnosis not present

## 2022-09-12 DIAGNOSIS — F411 Generalized anxiety disorder: Secondary | ICD-10-CM

## 2022-09-12 NOTE — Progress Notes (Signed)
[facility] MD OP Progress Note  09/12/2022 6:34 PM Alison [name]  MRN:  [id number]  Chief Complaint:  Chief Complaint  Patient presents with   Follow-up   Anxiety   Depression   Medication Refill   HPI: Alison [name] is a 45 year old Caucasian female, lives in [place], has a history of bipolar disorder, PTSD, multiple medical problems including fibromyalgia, chronic pain, trigeminal neuralgia, history of liver transplant, B-cell lymphoma, SLE, diabetes mellitus, stage IV chronic kidney disease, OSA, chronic migraine headaches, hyperlipidemia, hypertension was evaluated in office today.  Patient today reports she just returned from [place], after visiting her family.  Was able to spend some time with her dad who is currently struggling with medical problems.  Patient reports overall mood symptoms has been stable.  Denies any significant anxiety or depression symptoms.  Reports she is currently on Marinol for her pain.  She is being tapered down from the extensor.  While in [place] she took some cannabis over-the-counter hoping that it will have the same effect as Marinol.  However she reports she felt ' weird ' and will never try anything like that anymore.  Patient reports sleep is overall good.  Reports she may have gained a few pounds lately because of the Marinol since it causes weight gain.  She has gained at least 15 pounds in the past 1 month.  Motivated to start watching her diet.  Patient denies any suicidality, homicidality or perceptual disturbances.  Patient denies any other concerns today.  Visit Diagnosis:    ICD-10-CM   1. Bipolar 1 disorder, depressed, full remission (Alison [name])  F31.76     2. PTSD (post-traumatic stress disorder)  F43.10     3. GAD (generalized anxiety disorder)  F41.1     4. Insomnia due to medical condition  G47.01    Mood      Past Psychiatric History: Reviewed past psychiatric history from progress note on 06/10/2019.  Past Medical  History:  Past Medical History:  Diagnosis Date   Abnormal uterine bleeding    Allergy    Anxiety    Arthritis    Bipolar disorder (manic depression) ([place])    Chronic kidney failure    Chronic renal disease, stage III (HCC)    Diabetes mellitus without complication (HCC)    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[place]) 2015   Right axillary lymph node resected and chemo tx's.   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   Chemo tx's   Migraine    Morbid obesity ([place])    Neuromuscular disorder ([place])    neuropathy   Neuropathy    Personality disorder ([place])    Pineal gland cyst    Post herpetic neuralgia    PTSD (post-traumatic stress disorder)    Renal disorder    S/P liver transplant ([place]) 1993   Sleep apnea    has not recieved cpap yet-other one was recalled    Past Surgical History:  Procedure Laterality Date   BONE MARROW BIOPSY  01/13/2015   BREAST BIOPSY  12/2014   BREAST BIOPSY  2011   BREAST SURGERY  CHOLECYSTECTOMY     COLONOSCOPY     COLONOSCOPY WITH PROPOFOL N/A 02/28/2022   Procedure: COLONOSCOPY WITH PROPOFOL;  Surgeon: [name], MD;  Location[facility] ENDOSCOPY;  Service: Endoscopy;  Laterality: N/A;   ESOPHAGOGASTRODUODENOSCOPY     ESOPHAGOGASTRODUODENOSCOPY (EGD) WITH PROPOFOL N/A 01/09/2021   Procedure: ESOPHAGOGASTRODUODENOSCOPY (EGD) WITH PROPOFOL;  Surgeon: [name], MD;  Location: [facility] ENDOSCOPY;  Service: Endoscopy;  Laterality: N/A;   HERNIA REPAIR     x4-all from liver transplant   HYSTEROSCOPY WITH D & C N/A 11/13/2021   Procedure: DILATATION AND CURETTAGE /HYSTEROSCOPY;  Surgeon: Homero Fellers, MD;  Location: [facility] ORS;  Service: Gynecology;  Laterality:  N/A;   infusaport     LIVER TRANSPLANT  12/17/1991   LUMBAR PUNCTURE     PORT A CATH INJECTION ([place] HX)     RENAL BIOPSY     tumor removal  2015   cancer right side    Family Psychiatric History: Reviewed family psychiatric history from progress note on 06/10/2019.  Family History:  Family History  Problem Relation Age of Onset   Heart disease Mother    Hypertension Mother    Cancer - Other Mother    Bipolar disorder Mother    Heart disease Father    Hypertension Father    Diabetes Father    Parkinson's disease Maternal Grandmother    Cancer Maternal Aunt    Cancer Maternal Uncle    Cancer Maternal Grandfather    Lupus Paternal Grandmother    Hypertension Brother     Social History: Reviewed social history from progress note on 06/10/2019. Social History   Socioeconomic History   Marital status: Single    Spouse name: Not on file   Number of children: 0   Years of education: 9   Highest education level: GED or equivalent  Occupational History   Occupation: disabled  Tobacco Use   Smoking status: Never   Smokeless tobacco: Never  Vaping Use   Vaping Use: Never used  Substance and Sexual Activity   Alcohol use: Not Currently   Drug use: Not Currently    Comment: pain managment last used in early april   Sexual activity: Not Currently  Other Topics Concern   Not on file  Social History Narrative   Not on file   Social Determinants of Health   Financial Resource Strain: Low Risk  (04/23/2022)   Overall Financial Resource Strain (CARDIA)    Difficulty of Paying Living Expenses: Not very hard  Food Insecurity: No Food Insecurity (08/07/2022)   Hunger Vital Sign    Worried About Running Out of Food in the Last Year: Never true    Ran Out of Food in the Last Year: Never true  Transportation Needs: No Transportation Needs (08/07/2022)   PRAPARE - Hydrologist (Medical): No    Lack of Transportation (Non-Medical): No  Physical Activity:  Sufficiently Active (04/23/2022)   Exercise Vital Sign    Days of Exercise per Week: 3 days    Minutes of Exercise per Session: 150+ min  Stress: Stress Concern Present (05/24/2022)   [place]    Feeling of Stress : Very much  Social Connections: Moderately Integrated (09/10/2022)   Social Connection and Isolation Panel [NHANES]    Frequency of Communication with Friends and Family: More than three times a week    Frequency of Social Gatherings with Friends and Family: More than three times  a week    Attends Religious Services: More than 4 times per year    Active Member of Clubs or Organizations: No    Attends Archivist Meetings: Never    Marital Status: Living with partner    Allergies:  Allergies  Allergen Reactions   Penicillin G Itching, Rash, Anaphylaxis and Palpitations   Lamictal [Lamotrigine] Rash   Carbamazepine Other (See Comments)    Medication interaction-prograf    Hydrocodone-Acetaminophen Itching   Naproxen Itching   Penicillins     Metabolic Disorder Labs: Lab Results  Component Value Date   HGBA1C 6.4 03/06/2022   MPG 209 06/05/2020   Lab Results  Component Value Date   PROLACTIN 13.4 07/15/2019   PROLACTIN 21.3 05/24/2019   Lab Results  Component Value Date   CHOL 81 05/27/2022   TRIG 203 (H) 05/27/2022   HDL 35 (L) 05/27/2022   CHOLHDL 2.3 05/27/2022   LDLCALC 19 05/27/2022   LDLCALC 101 (H) 12/03/2021   Lab Results  Component Value Date   TSH 2.650 07/15/2019   TSH 4.650 (H) 05/24/2019    Therapeutic Level Labs: No results found for: "LITHIUM" No results found for: "VALPROATE" No results found for: "CBMZ"  Current Medications: Current Outpatient Medications  Medication Sig Dispense Refill   baclofen (LIORESAL) 10 MG tablet Take 10 mg by mouth 2 (two) times daily.     benztropine (COGENTIN) 1 MG tablet Take 1 tablet (1 mg total) by mouth daily as needed for  tremors. 90 tablet 0   Cholecalciferol (VITAMIN D) 125 MCG (5000 UT) CAPS Take 5,000 Units by mouth daily.     clindamycin (CLEOCIN) 300 MG capsule Take 300 mg by mouth every 6 (six) hours.     CRANBERRY PO Take 1 capsule by mouth in the morning and at bedtime.     dronabinol (MARINOL) 2.5 MG capsule Take 2.5 mg by mouth See admin instructions. Takes every other day     Dulaglutide 4.5 MG/0.5ML SOPN Inject 4.5 mg into the skin once a week.     ezetimibe (ZETIA) 10 MG tablet Take 10 mg by mouth every morning.     fluticasone (FLONASE) 50 MCG/ACT nasal spray Place into the nose.     icosapent Ethyl (VASCEPA) 1 g capsule Take 2 capsules (2 g total) by mouth 2 (two) times daily. 120 capsule 5   lisinopril (ZESTRIL) 20 MG tablet TAKE ONE TABLET BY MOUTH ONCE DAILY 90 tablet 3   lurasidone (LATUDA) 20 MG TABS tablet Take 1 tablet (20 mg total) by mouth daily with supper. 90 tablet 0   Magnesium 500 MG CAPS Take 500 mg by mouth daily.     Melatonin 10 MG TABS Take 10 mg by mouth at bedtime.     metFORMIN (GLUCOPHAGE) 1000 MG tablet TAKE ONE TABLET BY MOUTH TWICE DAILY with meals 180 tablet 2   mirtazapine (REMERON) 15 MG tablet Take 1 tablet (15 mg total) by mouth at bedtime. 90 tablet 0   MYRBETRIQ 50 MG TB24 tablet TAKE ONE TABLET BY MOUTH ONCE DAILY 90 tablet 3   naloxone (NARCAN) nasal spray 4 mg/0.1 mL Place 0.4 mg into the nose once.     pregabalin (LYRICA) 200 MG capsule Take 1 capsule (200 mg total) by mouth 2 (two) times daily. 60 capsule 0   rosuvastatin (CRESTOR) 10 MG tablet Take 1 tablet (10 mg total) by mouth daily. 90 tablet 3   tacrolimus (PROGRAF) 1 MG capsule Take by mouth.  UBRELVY 50 MG TABS Take 1 tablet by mouth daily as needed.     venlafaxine XR (EFFEXOR XR) 37.5 MG 24 hr capsule Take 1 capsule (37.5 mg total) by mouth daily with breakfast. Take along with 75 mg daily 90 capsule 0   venlafaxine XR (EFFEXOR XR) 75 MG 24 hr capsule Take 1 capsule (75 mg total) by mouth daily  with breakfast. 90 capsule 0   XTAMPZA ER 9 MG C12A Take 1 capsule by mouth every 12 (twelve) hours.     XYLIMELTS 550 MG DISK Take 550 mg by mouth every 8 (eight) hours as needed (dry mouth).     No current facility-administered medications for this visit.     Musculoskeletal: Strength & Muscle Tone: within normal limits Gait & Station: normal Patient leans: N/A  Psychiatric Specialty Exam: Review of Systems  Musculoskeletal:  Positive for back pain (chronic).  Psychiatric/Behavioral: Negative.    All other systems reviewed and are negative.   Blood pressure (!) 143/98, pulse (!) 103, temperature 97.8 F (36.6 C), temperature source Oral, height _0  (1.676 m), weight 256 lb 6.4 oz (116.3 kg).Body mass index is 41.38 kg/m.  General Appearance: Casual  Eye Contact:  Fair  Speech:  Clear and Coherent  Volume:  Normal  Mood:  Euthymic  Affect:  Congruent  Thought Process:  Goal Directed and Descriptions of Associations: Intact  Orientation:  Full (Time, Place, and Person)  Thought Content: Logical   Suicidal Thoughts:  No  Homicidal Thoughts:  No  Memory:  Immediate;   Fair Recent;   Fair Remote;   Fair  Judgement:  Fair  Insight:  Fair  Psychomotor Activity:  Normal  Concentration:  Concentration: Fair and Attention Span: Fair  Recall:  [employer] of Knowledge: Fair  Language: Fair  Akathisia:  No  Handed:  Right  AIMS (if indicated): done  Assets:  Communication Skills Desire for Improvement Social Support Talents/Skills Transportation  ADL's:  Intact  Cognition: WNL  Sleep:  Fair   Screenings: [place] Visit from 09/12/2022 in [place] Office Visit from 07/08/2022 in [place] Video Visit from 03/18/2022 in [place] Video Visit from 02/14/2022 in [place] Office Visit from 01/21/2022 in [place] Total Score 0 0 0 0 0      [place] Visit from 09/12/2022 in [place] Video Visit from 08/01/2022 in [place] Office Visit from 07/08/2022 in [place] Office Visit from 05/27/2022 in [facility] Video Visit from 03/18/2022 in [place]  Total GAD-7 Score _1 PHQ2-9    [place] Visit from 09/12/2022 in [place] Video Visit from 08/01/2022 in [place] Office Visit from 07/08/2022 in [place] Visit from 05/27/2022 in [facility] Video Visit from 05/21/2022 in [place]  PHQ-2 Total Score 0 _2 PHQ-9 Total Score -- _3 [place] Office Visit from 09/12/2022 in [place] Video Visit from 08/01/2022 in [place] Office Visit from 07/08/2022 in [place]  Assessment and Plan: Alison [name] is a 46 year old Caucasian female on disability with bipolar disorder, PTSD, multiple medical problems was evaluated in office today.  Patient is currently stable.  Plan as noted below.  Plan  Bipolar disorder type I depressed in remission Latuda 20 mg p.o. daily with supper Venlafaxine 112.5 mg p.o. daily. Cogentin 1 mg p.o. daily as needed for side effects.  GAD-stable Mirtazapine 15 mg p.o. nightly Continue CBT as needed  PTSD-stable Venlafaxine as prescribed Continue CBT  Insomnia-improving Mirtazapine 15 mg p.o. nightly Continue CPAP Melatonin 5 to 10 mg p.o. nightly Continue sleep hygiene techniques  Follow-up in clinic in 2 to 3 months or sooner if needed.  This  note was generated in part or whole with voice recognition software. Voice recognition is usually quite accurate but there are transcription errors that can and very often do occur. I apologize for any typographical errors that were not detected and corrected.      [name], MD 09/12/2022, 6:34 PM

## 2022-09-12 NOTE — Patient Outreach (Signed)
Medicaid Managed Care Social Work Note  09/12/2022 Name:  Alison [name] MRN:  [id number] DOB:  March 17, 1976  Alison [name] is an 46 y.o. year old female who is a primary patient of Bo Merino, FNP.  The [facility] Managed Care Coordination team was consulted for assistance with:   dental resources  Ms. Crotteau was given information about Medicaid Managed Care Coordination team services today. [name] Patient agreed to services and verbal consent obtained.  Engaged with patient  for by telephone forfollow up visit in response to referral for case management and/or care coordination services.   Assessments/Interventions:  Review of past medical history, allergies, medications, health status, including review of consultants reports, laboratory and other test data, was performed as part of comprehensive evaluation and provision of chronic care management services.  SDOH: (Social Determinant of Health) assessments and interventions performed: SDOH Interventions    Flowsheet Row Patient Outreach Telephone from 09/10/2022 in [place] Patient Outreach Telephone from 08/07/2022 in [place] Patient Outreach Telephone from 07/04/2022 in [place] Patient Outreach from 05/24/2022 in [place] Patient Outreach Telephone from 04/23/2022 in [place] Patient Outreach Telephone from 03/19/2022 in [place] Coordination  SDOH Interventions        Food Insecurity Interventions -- Intervention Not Indicated -- -- -- --  Housing Interventions -- -- Intervention Not Indicated -- -- --  Transportation Interventions -- Intervention Not Indicated -- -- -- --  Utilities Interventions -- -- Intervention Not Indicated -- -- --  Financial Strain Interventions -- -- -- --  Intervention Not Indicated Intervention Not Indicated  Physical Activity Interventions -- -- -- -- Intervention Not Indicated --  Stress Interventions -- -- -- Provide Counseling  [patient sees Psychiatrist] -- --  Social Connections Interventions Intervention Not Indicated -- -- -- -- --       Advanced Directives Status:  Not addressed in this encounter.  Care Plan                 Allergies  Allergen Reactions   Penicillin G Itching, Rash, Anaphylaxis and Palpitations   Lamictal [Lamotrigine] Rash   Carbamazepine Other (See Comments)    Medication interaction-prograf    Hydrocodone-Acetaminophen Itching   Naproxen Itching   Penicillins     Medications Reviewed Today     Reviewed by [name], RN (Registered Nurse) on 09/10/22 at [place] List Status: <None>   Medication Order Taking? Sig Documenting Provider Last Dose Status Informant  baclofen (LIORESAL) 10 MG tablet [id number]  Take 10 mg by mouth 2 (two) times daily. [provider]  Active Self  benztropine (COGENTIN) 1 MG tablet [id number]  Take 1 tablet (1 mg total) by mouth daily as needed for tremors. [name], MD  Active   Cholecalciferol (VITAMIN D) 125 MCG (5000 UT) CAPS [id number]  Take 5,000 Units by mouth daily. [provider]  Active Self  clindamycin (CLEOCIN) 300 MG capsule [id number]  Take 300 mg by mouth every 6 (six) hours. [provider]  Active   CRANBERRY PO [id number]  Take 1 capsule by mouth in the morning and at bedtime. [provider]  Active Self  dronabinol (MARINOL) 2.5 MG capsule [id number] Yes Take 2.5 mg by mouth 2 (two) times daily before a meal. Takes every other night [provider]  Active Self  Dulaglutide 4.5  MG/0.5ML SOPN [id number]  Inject 4.5 mg into the skin once a week. [provider]  Active   ezetimibe (ZETIA) 10 MG tablet [id number]  Take 10 mg by mouth every morning. [provider]  Active Self  fluticasone  (FLONASE) 50 MCG/ACT nasal spray [id number]  Place 1 spray into both nostrils daily as needed for allergies. [provider]  Active Self  fluticasone (FLONASE) 50 MCG/ACT nasal spray [id number]  Place into the nose.  Patient not taking: Reported on 08/01/2022   [provider]  Active   icosapent Ethyl (VASCEPA) 1 g capsule [id number]  Take 2 capsules (2 g total) by mouth 2 (two) times daily. [name], MD  Active Self           Med Note Jodi Mourning, [place] Mar 19, 2022 10:24 AM) Taking 1 capsule twice daily per Dr. Ronnald Collum  lisinopril (ZESTRIL) 20 MG tablet [id number]  TAKE ONE TABLET BY MOUTH ONCE DAILY Bo Merino, FNP  Active   lurasidone (LATUDA) 20 MG TABS tablet [id number]  Take 1 tablet (20 mg total) by mouth daily with supper. [name], MD  Active   Magnesium 500 MG CAPS [id number]  Take 500 mg by mouth daily. [provider]  Active Self  Melatonin 10 MG TABS [id number]  Take 10 mg by mouth at bedtime. [provider]  Active Self  metFORMIN (GLUCOPHAGE) 1000 MG tablet [id number]  TAKE ONE TABLET BY MOUTH TWICE DAILY with meals Bo Merino, FNP  Active   mirtazapine (REMERON) 15 MG tablet [id number]  Take 1 tablet (15 mg total) by mouth at bedtime. [name], MD  Active   MYRBETRIQ 50 MG TB24 tablet [id number]  TAKE ONE TABLET BY MOUTH ONCE DAILY Vaillancourt, Samantha, PA-C  Active   naloxone [facility]) nasal spray 4 mg/0.1 mL [id number]  Place 0.4 mg into the nose once. [provider]  Active Self  oxyCODONE ER [facility]) 13.5 MG C12A [id number]  Take 9 mg by mouth 2 (two) times daily. [provider]  Active Self  pregabalin (LYRICA) 200 MG capsule [id number]  Take 1 capsule (200 mg total) by mouth 2 (two) times daily. [name], MD  Active   rosuvastatin (CRESTOR) 10 MG tablet [id number]  Take 1 tablet (10 mg total) by mouth daily. [name], MD  Active   tacrolimus (PROGRAF) 1 MG capsule  [id number]  Take by mouth. [provider]  Active   UBRELVY 50 MG TABS [id number]  Take 1 tablet by mouth daily as needed. [provider]  Active   venlafaxine XR (EFFEXOR XR) 37.5 MG 24 hr capsule [id number]  Take 1 capsule (37.5 mg total) by mouth daily with breakfast. Take along with 75 mg daily [name], [name], MD  Active   venlafaxine XR (EFFEXOR XR) 75 MG 24 hr capsule [id number]  Take 1 capsule (75 mg total) by mouth daily with breakfast. [name], MD  Active   XYLIMELTS 550 MG DISK [id number]  Take 550 mg by mouth every 8 (eight) hours as needed (dry mouth). [provider]  Active Self          [name] completed a telephone outreach with patient. She stated she is is need of dentures. [name] informed patient there were no resources for [facility] but did have some for [employer]. [name] emailed resources to [email or web address]. No other resources are needed at this time.  Patient Active Problem List   Diagnosis Date Noted  Bipolar 1 disorder, depressed, moderate ([place]) 07/08/2022   Sjogren's syndrome ([place]) 05/27/2022   Encounter for screening colonoscopy    Menorrhagia with irregular cycle    Akathisia 10/24/2021   At risk for prolonged QT interval syndrome 09/12/2021   Prolonged Q-T interval on ECG 08/29/2021   Insomnia 07/20/2021   Bipolar disorder, in full remission, most recent episode mixed ([place]) 07/20/2021   Bipolar 1 disorder, mixed, mild ([place]) 05/09/2021   GAD (generalized anxiety disorder) 05/09/2021   Hepatic cirrhosis ([place])    Opioid dependence with opioid-induced disorder ([place]) 12/20/2020   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/20/2020   Bipolar 1 disorder, depressed, full remission ([place]) 09/27/2020   Suprapubic pain 07/25/2020   Bipolar 1 disorder, depressed, mild ([place]) 07/10/2020   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   Edema of lower extremity 09/16/2019   Carpal tunnel syndrome, left 07/26/2019    Cubital tunnel syndrome on left 07/26/2019   Bipolar I disorder, most recent episode depressed ([place]) 07/12/2019   PTSD (post-traumatic stress disorder) 86/38/1771   Umbilical hernia without obstruction and without gangrene 06/17/2019   Encounter for surveillance of abnormal nevi 06/17/2019   Pineal gland cyst 06/17/2019   Chronic hip pain, left 06/02/2019   Lumbar spondylosis 06/02/2019   Mouth dryness 06/02/2019   Obesity (BMI 35.0-39.9 without comorbidity) 06/02/2019   Goals of care, counseling/discussion 05/18/2019   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[place]) 05/18/2019   Occipital neuralgia 05/13/2019   Plantar fasciitis of left foot 04/27/2019   Fibromyalgia 03/23/2019   Systemic lupus erythematosus (SLE) in adult [facility]) 03/23/2019   Essential hypertension 03/23/2019   Hepatitis 03/23/2019   Mass of left kidney 03/23/2019   Diabetes mellitus ([place]) 03/23/2019   Nausea without vomiting 03/23/2019   Neuropathy 03/23/2019   Cancer ([place]) 03/23/2019   Hyperlipidemia 03/23/2019   Osteoarthritis 03/23/2019   Trigeminal neuralgia 03/23/2019   Chronic renal disease, stage III ([place]) 03/23/2019   Nephrolithiasis 03/23/2019   Migraine 03/23/2019   OSA on CPAP 03/23/2019   Fibrocystic breast 03/23/2019   Heart murmur 03/23/2019   Bipolar disorder, in partial remission, most recent episode depressed ([place]) 07/02/2017   Abnormal uterine bleeding 03/18/2017   Major depressive disorder, recurrent ([place]) 03/18/2017   Morbid obesity ([place]) 03/18/2017   Secondary hyperparathyroidism ([place]) 02/07/2017   Neuropathic pain 11/19/2016   Post herpetic neuralgia 11/19/2016   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[place]) 01/13/2015   Lumbar disc disease with radiculopathy 07/26/2013   S/P liver transplant ([place]) 07/26/2013   Type 2 diabetes mellitus, uncontrolled 07/26/2013   Facial nerve disorder  06/29/2012   Low back pain 12/26/2010    Conditions to be addressed/monitored per PCP order:   dental resources  There are no care plans that you recently modified to display for this patient.   Follow up:  Patient agrees to Care Plan and Follow-up.  Plan: The Managed Medicaid care management team will reach out to the patient again over the next 30 days.  Date/time of next scheduled Social Work care management/care coordination outreach:  10/14/22  Mickel Fuchs, Arita Miss, [place] Medicaid Team  [phone]

## 2022-09-12 NOTE — Patient Instructions (Signed)
Visit Information  Ms. Bardwell was given information about Medicaid Managed Care team care coordination services as a part of their Healthy [facility] Medicaid benefit. [name] verbally consented to engagement with the [facility] Managed Care team.   If you are experiencing a medical emergency, please call 911 or report to your local emergency department or urgent care.   If you have a non-emergency medical problem during routine business hours, please contact your provider's office and ask to speak with a nurse.   For questions related to your Healthy [facility] health plan, please call: [phone] or visit the homepage here: [email or web address]  If you would like to schedule transportation through your Healthy [facility] plan, please call the following number at least 2 days in advance of your appointment: [phone]  For information about your ride after you set it up, call Ride Assist at [phone]. Use this number to activate a Will Call pickup, or if your transportation is late for a scheduled pickup. Use this number, too, if you need to make a change or cancel a previously scheduled reservation.  If you need transportation services right away, call [phone]. The after-hours call center is staffed 24 hours to handle ride assistance and urgent reservation requests (including discharges) 365 days a year. Urgent trips include sick visits, hospital discharge requests and life-sustaining treatment.  Call the [place] at [phone], at any time, 24 hours a day, 7 days a week. If you are in danger or need immediate medical attention call 911.  If you would like help to quit smoking, call 1-800-QUIT-NOW (310)523-8339) OR Espaol: 1-855-Djelo-Ya (5-053-976-7341) o para ms informacin haga clic aqu or Text READY to 200-400 to register via text  Ms. Hoskinson - following are the goals we discussed in your visit today:    Goals Addressed   None       Social Worker will follow up on 10/14/22.   Mickel Fuchs, [name], [place] Managed Medicaid Team  [phone]   Following is a copy of your plan of care:  There are no care plans that you recently modified to display for this patient.

## 2022-09-17 DIAGNOSIS — Z944 Liver transplant status: Secondary | ICD-10-CM | POA: Diagnosis not present

## 2022-09-17 DIAGNOSIS — Z5181 Encounter for therapeutic drug level monitoring: Secondary | ICD-10-CM | POA: Diagnosis not present

## 2022-09-18 ENCOUNTER — Other Ambulatory Visit: Payer: Self-pay

## 2022-09-18 ENCOUNTER — Emergency Department
Admission: EM | Admit: 2022-09-18 | Discharge: 2022-09-18 | Disposition: A | Discharge status: Home / Self Care | Payer: Medicaid Other | Attending: Emergency Medicine | Admitting: Emergency Medicine

## 2022-09-18 ENCOUNTER — Encounter: Payer: Self-pay | Admitting: *Deleted

## 2022-09-18 DIAGNOSIS — R739 Hyperglycemia, unspecified: Secondary | ICD-10-CM

## 2022-09-18 DIAGNOSIS — E1165 Type 2 diabetes mellitus with hyperglycemia: Secondary | ICD-10-CM | POA: Insufficient documentation

## 2022-09-18 LAB — CBC
HCT: 39.4 % (ref 36.0–46.0)
Hemoglobin: 13.6 g/dL (ref 12.0–15.0)
MCH: 28.9 pg (ref 26.0–34.0)
MCHC: 34.5 g/dL (ref 30.0–36.0)
MCV: 83.7 fL (ref 80.0–100.0)
Platelets: 124 10*3/uL — ABNORMAL LOW (ref 150–400)
RBC: 4.71 MIL/uL (ref 3.87–5.11)
RDW: 12.7 % (ref 11.5–15.5)
WBC: 7.1 10*3/uL (ref 4.0–10.5)
nRBC: 0 % (ref 0.0–0.2)

## 2022-09-18 LAB — CBG MONITORING, ED
Glucose-Capillary: 455 mg/dL — ABNORMAL HIGH (ref 70–99)
Glucose-Capillary: 357 mg/dL — ABNORMAL HIGH (ref 70–99)
Glucose-Capillary: 287 mg/dL — ABNORMAL HIGH (ref 70–99)
Glucose-Capillary: 296 mg/dL — ABNORMAL HIGH (ref 70–99)

## 2022-09-18 LAB — BASIC METABOLIC PANEL
Anion gap: 8 (ref 5–15)
BUN: 23 mg/dL — ABNORMAL HIGH (ref 6–20)
CO2: 22 mmol/L (ref 22–32)
Calcium: 9.4 mg/dL (ref 8.9–10.3)
Chloride: 103 mmol/L (ref 98–111)
Creatinine, Ser: 1.04 mg/dL — ABNORMAL HIGH (ref 0.44–1.00)
GFR, Estimated: >60 mL/min (ref >60)
Glucose, Bld: 455 mg/dL — ABNORMAL HIGH (ref 70–99)
Potassium: 4.3 mmol/L (ref 3.5–5.1)
Sodium: 133 mmol/L — ABNORMAL LOW (ref 135–145)

## 2022-09-18 MED ORDER — SODIUM CHLORIDE 0.9 % IV BOLUS
1000.0000 mL | Freq: Once | INTRAVENOUS | Status: AC
  Administered 2022-09-18: 1000 mL via INTRAVENOUS

## 2022-09-18 MED ORDER — INSULIN ASPART 100 UNIT/ML IJ SOLN
10.0000 [IU] | Freq: Once | INTRAMUSCULAR | Status: AC
  Administered 2022-09-18: 10 [IU] via INTRAVENOUS
  Filled 2022-09-18: qty 1

## 2022-09-18 NOTE — ED Provider Notes (Signed)
[facility] Provider Note    Event Date/Time   First MD Initiated Contact with Patient 09/18/22 1842     (approximate)   History   Hyperglycemia   HPI  Alison [name] is a 46 y.o. female who presents to the emergency department today because of concerns for elevated blood sugar.  Patient does have a history of diabetes.  She states over the past months she has been out of town so has not had her Trul[place].  She did think that her sugars were well-controlled when she was out of town however noticed yesterday that her sugars were in the 500s.  Today the sugars continue to be elevated she also started having some blurry vision.  Patient has had increased thirst.      Physical Exam   Triage Vital Signs: ED Triage Vitals  Enc Vitals Group     BP 09/18/22 1808 125/89     Pulse Rate 09/18/22 1808 89     Resp 09/18/22 1808 18     Temp 09/18/22 1808 99.2 F (37.3 C)     Temp Source 09/18/22 1808 Oral     SpO2 09/18/22 1808 99 %     Weight 09/18/22 1805 245 lb (111.1 kg)     Height 09/18/22 1805 5' 6"  (1.676 m)     Head Circumference --      Peak Flow --      Pain Score 09/18/22 1804 3     Pain Loc --      Pain Edu? --      Excl. in [place]? --     Most recent vital signs: Vitals:   09/18/22 1808  BP: 125/89  Pulse: 89  Resp: 18  Temp: 99.2 F (37.3 C)  SpO2: 99%   General: Awake, alert, oriented. CV:  Good peripheral perfusion. Regular rate and rhythm. Resp:  Normal effort. Lungs clear. Abd:  No distention.    ED Results / Procedures / Treatments   Labs (all labs ordered are listed, but only abnormal results are displayed) Labs Reviewed  BASIC METABOLIC PANEL - Abnormal; Notable for the following components:      Result Value   Sodium 133 (*)    Glucose, Bld 455 (*)    BUN 23 (*)    Creatinine, Ser 1.04 (*)    All other components within normal limits  CBC - Abnormal; Notable for the following components:   Platelets 124 (*)     All other components within normal limits  CBG MONITORING, ED - Abnormal; Notable for the following components:   Glucose-Capillary 455 (*)    All other components within normal limits  CBG MONITORING, ED - Abnormal; Notable for the following components:   Glucose-Capillary 357 (*)    All other components within normal limits  CBG MONITORING, ED - Abnormal; Notable for the following components:   Glucose-Capillary 287 (*)    All other components within normal limits  URINALYSIS, ROUTINE W REFLEX MICROSCOPIC     EKG  None   RADIOLOGY None   PROCEDURES:  Critical Care performed: No  Procedures   MEDICATIONS ORDERED IN ED: Medications - No data to display   IMPRESSION / MDM / [place] / ED COURSE  I reviewed the triage vital signs and the nursing notes.                              Differential  diagnosis includes, but is not limited to, dka, hyperglycemia.  Patient's presentation is most consistent with acute presentation with potential threat to life or bodily function.  Patient presented to the emergency department today because of concerns for elevated blood sugar.  Patient's blood sugar was elevated.  No anion gap to suggest DKA.  She was given IV fluids and insulin.  Blood sugars did start coming down. I do think likely patient's blood sugar was elevated given that she had not been taking her medication for about one month.  Discussed with patient importance of primary care follow-up.   FINAL CLINICAL IMPRESSION(S) / ED DIAGNOSES   Final diagnoses:  Hyperglycemia      Note:  This document was prepared using Dragon voice recognition software and may include unintentional dictation errors.    [name], MD 09/18/22 [phone]

## 2022-09-18 NOTE — Discharge Instructions (Signed)
Please seek medical attention for any high fevers, chest pain, shortness of breath, change in behavior, persistent vomiting, bloody stool or any other new or concerning symptoms.  

## 2022-09-18 NOTE — ED Triage Notes (Addendum)
Pt has high blood sugar at home.  Pt also reports blurred vision.  Pt has diarrhea x 4 days.  No vomiting.  No abd pain  pt alert  speech clear.  Hx liver transplant.     Pt has a port

## 2022-09-18 NOTE — ED Notes (Signed)
Fsbs 455 in triage.

## 2022-09-19 DIAGNOSIS — I129 Hypertensive chronic kidney disease with stage 1 through stage 4 chronic kidney disease, or unspecified chronic kidney disease: Secondary | ICD-10-CM | POA: Diagnosis not present

## 2022-09-19 DIAGNOSIS — Z880 Allergy status to penicillin: Secondary | ICD-10-CM | POA: Diagnosis not present

## 2022-09-19 DIAGNOSIS — Z23 Encounter for immunization: Secondary | ICD-10-CM | POA: Diagnosis not present

## 2022-09-19 DIAGNOSIS — Z79899 Other long term (current) drug therapy: Secondary | ICD-10-CM | POA: Diagnosis not present

## 2022-09-19 DIAGNOSIS — E1122 Type 2 diabetes mellitus with diabetic chronic kidney disease: Secondary | ICD-10-CM | POA: Diagnosis not present

## 2022-09-19 DIAGNOSIS — Z7984 Long term (current) use of oral hypoglycemic drugs: Secondary | ICD-10-CM | POA: Diagnosis not present

## 2022-09-19 DIAGNOSIS — K7469 Other cirrhosis of liver: Secondary | ICD-10-CM | POA: Diagnosis not present

## 2022-09-19 DIAGNOSIS — F319 Bipolar disorder, unspecified: Secondary | ICD-10-CM | POA: Diagnosis not present

## 2022-09-19 DIAGNOSIS — N183 Chronic kidney disease, stage 3 unspecified: Secondary | ICD-10-CM | POA: Diagnosis not present

## 2022-09-19 DIAGNOSIS — Z944 Liver transplant status: Secondary | ICD-10-CM | POA: Diagnosis not present

## 2022-09-19 DIAGNOSIS — Z129 Encounter for screening for malignant neoplasm, site unspecified: Secondary | ICD-10-CM | POA: Diagnosis not present

## 2022-09-23 DIAGNOSIS — F3189 Other bipolar disorder: Secondary | ICD-10-CM | POA: Diagnosis not present

## 2022-09-23 DIAGNOSIS — F320 Major depressive disorder, single episode, mild: Secondary | ICD-10-CM | POA: Diagnosis not present

## 2022-09-23 DIAGNOSIS — G894 Chronic pain syndrome: Secondary | ICD-10-CM | POA: Diagnosis not present

## 2022-09-23 DIAGNOSIS — M79606 Pain in leg, unspecified: Secondary | ICD-10-CM | POA: Diagnosis not present

## 2022-09-23 DIAGNOSIS — M25519 Pain in unspecified shoulder: Secondary | ICD-10-CM | POA: Diagnosis not present

## 2022-09-23 DIAGNOSIS — M5414 Radiculopathy, thoracic region: Secondary | ICD-10-CM | POA: Diagnosis not present

## 2022-09-23 DIAGNOSIS — G893 Neoplasm related pain (acute) (chronic): Secondary | ICD-10-CM | POA: Diagnosis not present

## 2022-09-23 DIAGNOSIS — M5442 Lumbago with sciatica, left side: Secondary | ICD-10-CM | POA: Diagnosis not present

## 2022-09-23 DIAGNOSIS — I10 Essential (primary) hypertension: Secondary | ICD-10-CM | POA: Diagnosis not present

## 2022-09-23 DIAGNOSIS — M79603 Pain in arm, unspecified: Secondary | ICD-10-CM | POA: Diagnosis not present

## 2022-09-23 DIAGNOSIS — G8929 Other chronic pain: Secondary | ICD-10-CM | POA: Diagnosis not present

## 2022-10-08 DIAGNOSIS — F411 Generalized anxiety disorder: Secondary | ICD-10-CM | POA: Diagnosis not present

## 2022-10-08 DIAGNOSIS — F4310 Post-traumatic stress disorder, unspecified: Secondary | ICD-10-CM | POA: Diagnosis not present

## 2022-10-08 DIAGNOSIS — F3181 Bipolar II disorder: Secondary | ICD-10-CM | POA: Diagnosis not present

## 2022-10-09 ENCOUNTER — Encounter: Payer: Self-pay | Admitting: Nurse Practitioner

## 2022-10-14 ENCOUNTER — Other Ambulatory Visit: Payer: Medicaid Other

## 2022-10-14 NOTE — Patient Outreach (Signed)
Medicaid Managed Care Social Work Note  10/14/2022 Name:  Alison [name] MRN:  [id number] DOB:  1976/07/04  Alison [name] is an 46 y.o. year old female who is a primary patient of Bo Merino, FNP.  The Medicaid Managed Care Coordination team was consulted for assistance with:  Community Resources   Alison [name] was given information about Medicaid Managed Care Coordination team services today. Alison [name] Patient agreed to services and verbal consent obtained.  Engaged with patient  for by telephone forfollow up visit in response to referral for case management and/or care coordination services.   Assessments/Interventions:  Review of past medical history, allergies, medications, health status, including review of consultants reports, laboratory and other test data, was performed as part of comprehensive evaluation and provision of chronic care management services.  SDOH: (Social Determinant of Health) assessments and interventions performed: SDOH Interventions    Flowsheet Row Patient Outreach Telephone from 09/10/2022 in [place] Patient Outreach Telephone from 08/07/2022 in [place] Patient Outreach Telephone from 07/04/2022 in [place] Patient Outreach from 05/24/2022 in [place] Patient Outreach Telephone from 04/23/2022 in [place] Patient Outreach Telephone from 03/19/2022 in [place] Interventions        Food Insecurity Interventions -- Intervention Not Indicated -- -- -- --  Housing Interventions -- -- Intervention Not Indicated -- -- --  Transportation Interventions -- Intervention Not Indicated -- -- -- --  Utilities Interventions -- -- Intervention Not Indicated -- -- --  Financial Strain Interventions -- -- -- --  Intervention Not Indicated Intervention Not Indicated  Physical Activity Interventions -- -- -- -- Intervention Not Indicated --  Stress Interventions -- -- -- Provide Counseling  [patient sees Psychiatrist] -- --  Social Connections Interventions Intervention Not Indicated -- -- -- -- --     [name] completed a telephone outreach with patient to follow up on denture resources. Patient stated she did not receive the resources, [name] verified email address provide and she confirmed it was the correct email. [name] will resend denture resources via email. Patient states no other resources are needed at this time.  Advanced Directives Status:  Not addressed in this encounter.  Care Plan                 Allergies  Allergen Reactions   Penicillin G Itching, Rash, Anaphylaxis and Palpitations   Lamictal [Lamotrigine] Rash   Carbamazepine Other (See Comments)    Medication interaction-prograf    Hydrocodone-Acetaminophen Itching   Naproxen Itching   Penicillins     Medications Reviewed Today     Reviewed by Alison Alert, MD (Psychiatrist) on 09/12/22 at 1650  Med List Status: <None>   Medication Order Taking? Sig Documenting Provider Last Dose Status Informant  baclofen (LIORESAL) 10 MG tablet [id number] Yes Take 10 mg by mouth 2 (two) times daily. [provider] Taking Active Self  benztropine (COGENTIN) 1 MG tablet [id number] Yes Take 1 tablet (1 mg total) by mouth daily as needed for tremors. Alison Alert, MD Taking Active   Cholecalciferol (VITAMIN D) 125 MCG (5000 UT) CAPS [id number] Yes Take 5,000 Units by mouth daily. [provider] Taking Active Self  clindamycin (CLEOCIN) 300 MG capsule [id number] Yes Take 300 mg by mouth every 6 (six) hours. [provider] Taking Active   CRANBERRY PO [id number] Yes Take 1  capsule by mouth in the morning and at bedtime. [provider] Taking Active Self  dronabinol (MARINOL) 2.5 MG capsule [id number] Yes Take 2.5 mg  by mouth See admin instructions. Takes every other day [provider] Taking Active Self  Dulaglutide 4.5 MG/0.5ML SOPN [id number] Yes Inject 4.5 mg into the skin once a week. [provider] Taking Active   ezetimibe (ZETIA) 10 MG tablet [id number] Yes Take 10 mg by mouth every morning. [provider] Taking Active Self  fluticasone (FLONASE) 50 MCG/ACT nasal spray [id number] No Place 1 spray into both nostrils daily as needed for allergies.  Patient not taking: Reported on 09/12/2022   [provider] Not Taking Consider Medication Status and Discontinue (Duplicate) Self  fluticasone (FLONASE) 50 MCG/ACT nasal spray [id number] Yes Place into the nose. [provider] Taking Active   icosapent Ethyl (VASCEPA) 1 g capsule [id number] Yes Take 2 capsules (2 g total) by mouth 2 (two) times daily. [name], MD Taking Active Self           Med Note (HARPER, [name]   Tue Mar 19, 2022 10:24 AM) Taking 1 capsule twice daily per Dr. Ronnald Collum  lisinopril (ZESTRIL) 20 MG tablet [id number] Yes TAKE ONE TABLET BY MOUTH ONCE DAILY Bo Merino, FNP Taking Active   lurasidone (LATUDA) 20 MG TABS tablet [id number] Yes Take 1 tablet (20 mg total) by mouth daily with supper. Alison Alert, MD Taking Active   Magnesium 500 MG CAPS [id number] Yes Take 500 mg by mouth daily. [provider] Taking Active Self  Melatonin 10 MG TABS [id number] Yes Take 10 mg by mouth at bedtime. [provider] Taking Active Self  metFORMIN (GLUCOPHAGE) 1000 MG tablet [id number] Yes TAKE ONE TABLET BY MOUTH TWICE DAILY with meals Bo Merino, FNP Taking Active   methocarbamol (ROBAXIN) 500 MG tablet [id number] No Take by mouth.  Patient not taking: Reported on 09/12/2022   [provider] Not Taking Consider Medication Status and Discontinue   mirtazapine (REMERON) 15 MG tablet [id number] Yes Take 1 tablet (15 mg total) by mouth at bedtime. Alison Alert, MD Taking Active   MYRBETRIQ 50 MG TB24 tablet [id number] Yes TAKE ONE TABLET BY MOUTH ONCE DAILY Vaillancourt, Samantha, PA-C Taking Active   naloxone [facility]) nasal spray 4 mg/0.1 mL [id number] Yes Place 0.4 mg into the nose once. [provider] Taking Active Self  oxyCODONE ER [facility]) 13.5 MG C12A [id number] No Take 9 mg by mouth 2 (two) times daily.  Patient not taking: Reported on 09/12/2022   [provider] Not Taking Consider Medication Status and Discontinue Self  pregabalin (LYRICA) 200 MG capsule [id number] Yes Take 1 capsule (200 mg total) by mouth 2 (two) times daily. [name], MD Taking Active   rosuvastatin (CRESTOR) 10 MG tablet [id number] Yes Take 1 tablet (10 mg total) by mouth daily. [name], MD Taking Active   tacrolimus (PROGRAF) 1 MG capsule [id number] Yes Take by mouth. [provider] Taking Active   UBRELVY 50 MG TABS [id number] Yes Take 1 tablet by mouth daily as needed. [provider] Taking Active   venlafaxine XR (EFFEXOR XR) 37.5 MG 24 hr capsule [id number] Yes Take 1 capsule (37.5 mg total) by mouth daily with breakfast. Take along with 75 mg daily [name], [name], MD Taking Active   venlafaxine XR (EFFEXOR XR) 75 MG 24 hr capsule [id number] Yes Take 1 capsule (75 mg total) by mouth daily with breakfast. [name], [name],  MD Taking Active   XTAMPZA ER 9 MG C12A [id number] Yes Take 1 capsule by mouth every 12 (twelve) hours. [provider]  Active   XYLIMELTS 550 MG DISK [id number] Yes Take 550 mg by mouth every 8 (eight) hours as needed (dry mouth). [provider] Taking Active Self            Patient Active Problem List   Diagnosis Date Noted   Bipolar 1 disorder, depressed, moderate ([place]) 07/08/2022   Sjogren's syndrome ([place]) 05/27/2022   Encounter for screening colonoscopy    Menorrhagia with irregular cycle    Akathisia 10/24/2021   At risk for prolonged QT interval syndrome  09/12/2021   Prolonged Q-T interval on ECG 08/29/2021   Insomnia 07/20/2021   Bipolar disorder, in full remission, most recent episode mixed ([place]) 07/20/2021   Bipolar 1 disorder, mixed, mild ([place]) 05/09/2021   GAD (generalized anxiety disorder) 05/09/2021   Hepatic cirrhosis ([place])    Opioid dependence with opioid-induced disorder ([place]) 12/20/2020   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   Bipolar 1 disorder, depressed, full remission ([place]) 09/27/2020   Suprapubic pain 07/25/2020   Bipolar 1 disorder, depressed, mild ([place]) 07/10/2020   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   Edema of lower extremity 09/16/2019   Carpal tunnel syndrome, left 07/26/2019   Cubital tunnel syndrome on left 07/26/2019   Bipolar I disorder, most recent episode depressed ([place]) 07/12/2019   PTSD (post-traumatic stress disorder) 07/31/8526   Umbilical hernia without obstruction and without gangrene 06/17/2019   Encounter for surveillance of abnormal nevi 06/17/2019   Pineal gland cyst 06/17/2019   Chronic hip pain, left 06/02/2019   Lumbar spondylosis 06/02/2019   Mouth dryness 06/02/2019   Obesity (BMI 35.0-39.9 without comorbidity) 06/02/2019   Goals of care, counseling/discussion 05/18/2019   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   Occipital neuralgia 05/13/2019   Plantar fasciitis of left foot 04/27/2019   Fibromyalgia 03/23/2019   Systemic lupus erythematosus (SLE) in adult [facility]) 03/23/2019   Essential hypertension 03/23/2019   Hepatitis 03/23/2019   Mass of left kidney 03/23/2019   Diabetes mellitus ([place]) 03/23/2019   Nausea without vomiting 03/23/2019   Neuropathy 03/23/2019   Cancer ([place]) 03/23/2019   Hyperlipidemia 03/23/2019   Osteoarthritis 03/23/2019   Trigeminal neuralgia 03/23/2019   Chronic renal disease, stage III ([place]) 03/23/2019    Nephrolithiasis 03/23/2019   Migraine 03/23/2019   OSA on CPAP 03/23/2019   Fibrocystic breast 03/23/2019   Heart murmur 03/23/2019   Bipolar disorder, in partial remission, most recent episode depressed ([place]) 07/02/2017   Abnormal uterine bleeding 03/18/2017   Major depressive disorder, recurrent ([place]) 03/18/2017   Morbid obesity ([place]) 03/18/2017   Secondary hyperparathyroidism ([place]) 02/07/2017   Neuropathic pain 11/19/2016   Post herpetic neuralgia 11/19/2016   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   Follow up:  Patient agrees to Care Plan and Follow-up.  Plan: The Managed Medicaid care management team will reach out to the patient again over the next 30 days.  Date/time  of next scheduled Social Work care management/care coordination outreach:  11/14/22  Mickel Fuchs, Arita Miss, [place] Medicaid Team  [phone]

## 2022-10-14 NOTE — Patient Instructions (Signed)
Visit Information  Ms. Dreese was given information about Medicaid Managed Care team care coordination services as a part of their Healthy [facility] Medicaid benefit. [name] verbally consented to engagement with the [facility] Managed Care team.   If you are experiencing a medical emergency, please call 911 or report to your local emergency department or urgent care.   If you have a non-emergency medical problem during routine business hours, please contact your provider's office and ask to speak with a nurse.   For questions related to your Healthy [facility] health plan, please call: [phone] or visit the homepage here: [email or web address]  If you would like to schedule transportation through your Healthy [facility] plan, please call the following number at least 2 days in advance of your appointment: [phone]  For information about your ride after you set it up, call Ride Assist at [phone]. Use this number to activate a Will Call pickup, or if your transportation is late for a scheduled pickup. Use this number, too, if you need to make a change or cancel a previously scheduled reservation.  If you need transportation services right away, call [phone]. The after-hours call center is staffed 24 hours to handle ride assistance and urgent reservation requests (including discharges) 365 days a year. Urgent trips include sick visits, hospital discharge requests and life-sustaining treatment.  Call the [place] at [phone], at any time, 24 hours a day, 7 days a week. If you are in danger or need immediate medical attention call 911.  If you would like help to quit smoking, call 1-800-QUIT-NOW 2540624475) OR Espaol: 1-855-Djelo-Ya (1-937-902-4097) o para ms informacin haga clic aqu or Text READY to 200-400 to register via text  Ms. Vegh - following are the goals we discussed in your visit today:    Goals Addressed   None      Social Worker will follow up oin 11/14/22.   Mickel Fuchs, [name], [place] Managed Medicaid Team  [phone]   Following is a copy of your plan of care:  There are no care plans that you recently modified to display for this patient.

## 2022-10-15 ENCOUNTER — Encounter: Payer: Self-pay | Admitting: Obstetrics and Gynecology

## 2022-10-15 ENCOUNTER — Other Ambulatory Visit: Payer: Medicaid Other | Admitting: Obstetrics and Gynecology

## 2022-10-15 NOTE — Patient Instructions (Signed)
HI [name], I hope you feel better!  [name] was given information about Medicaid Managed Care team care coordination services as a part of their Healthy [facility] Medicaid benefit. Alison [name] verbally consented to engagement with the [facility] Managed Care team.   If you are experiencing a medical emergency, please call 911 or report to your local emergency department or urgent care.   If you have a non-emergency medical problem during routine business hours, please contact your provider's office and ask to speak with a nurse.   For questions related to your Healthy [facility] health plan, please call: [phone] or visit the homepage here: [email or web address]  If you would like to schedule transportation through your Healthy [facility] plan, please call the following number at least 2 days in advance of your appointment: [phone]  For information about your ride after you set it up, call Ride Assist at [phone]. Use this number to activate a Will Call pickup, or if your transportation is late for a scheduled pickup. Use this number, too, if you need to make a change or cancel a previously scheduled reservation.  If you need transportation services right away, call [phone]. The after-hours call center is staffed 24 hours to handle ride assistance and urgent reservation requests (including discharges) 365 days a year. Urgent trips include sick visits, hospital discharge requests and life-sustaining treatment.  Call the [place] at [phone], at any time, 24 hours a day, 7 days a week. If you are in danger or need immediate medical attention call 911.  If you would like help to quit smoking, call 1-800-QUIT-NOW 5640067683) OR Espaol: 1-855-Djelo-Ya (3-329-518-8416) o para ms informacin haga clic aqu or Text READY to 200-400 to register via text  [name] - following are the goals we discussed in  your visit today:   Goals Addressed             This Visit's Progress    Chronic Pain Managed       10/15/22:  patient with fibromyalgia pain only at this time due to weather    Get sugars at goal       Timeframe:  Short-Term Goal Priority:  High Start Date:                             Expected End Date:      ongoing                 Follow Up Date: 11/11/22   - call for medicine refill 2 or 3 days before it runs out - call if I am sick and can't take my medicine - keep a list of all the medicines I take; vitamins and herbals too    Why is this important?   These steps will help you keep on track with your medicines.  10/15/22:  Patient states that she has all of her medicine and taking all medicine   Patient verbalizes understanding of instructions and care plan provided today and agrees to view in [place]. Active MyChart status and patient understanding of how to access instructions and care plan via MyChart confirmed with patient.     The Managed Medicaid care management team will reach out to the patient again over the next 30 business  days.  The  Patient has been provided with contact information for the Managed Medicaid care management team and has been advised to call with any  health related questions or concerns.   Alison [name], [name] [place] Management Coordinator - Managed Medicaid High Risk [phone]   Following is a copy of your plan of care:  Care Plan : Chronic Pain (Adult)  Updates made by Alison Medicus, [name] since 10/15/2022 12:00 AM     Problem: Chronic Pain Management-fibromyalgia   Priority: High  Onset Date: 01/22/2021     Long-Range Goal: Fibromyalgia pain managed-new pain management provider   Start Date: 08/14/2020  Expected End Date: 12/10/2022  Recent Progress: On track  Priority: High  Note:   Current Barriers:  Knowledge deficits related to pain management  09/10/22:  Pain controlled right now per patient-Dr.  Humphrey Rolls recently added Marinol and has helped her pain and facial pain. 10/15/22:  patient with fibromyalgia pain only right now due to weather  Nurse Case Manager Clinical Goal(s):  Over the next 45 days, patient will work with [facility] to address needs related to referral for pain management provider and associated care coordination needs. 05/24/22:  patient is currently seeing Dr. Humphrey Rolls at [facility].  Interventions:  Inter-disciplinary care team collaboration (see longitudinal plan of care) Evaluation of current treatment plan related to fibromyalgia  and patient's adherence to plan as established by provider. Update 06/15/21:  Patient taking Baclofen again, Effexor added. Collaborated with primary care provider regarding recommendations and referral to pain management provider. Discussed plans with patient for ongoing care management follow up and provided patient with direct contact information for care management team Anticipate pain education program, pain management support as part of pain management referral.       Patient Goals/Self-Care Activities Over the next 45 days, patient will:  -Attends all scheduled provider appointments  develop a personal pain management plan with your pain management provider.  Follow Up Plan:  The Managed Medicaid care management team will reach out to the patient again over the next 30  business days.    Care Plan : Wellness (Adult)  Updates made by Alison Medicus, [name] since 10/15/2022 12:00 AM     Problem: Medication Adherence (Wellness)   Priority: High  Onset Date: 01/22/2021     Long-Range Goal: Medication Adherence Maintained   Start Date: 09/06/2020  Expected End Date: 12/10/2022  Recent Progress: On track  Priority: High  Note:   Current Barriers:  Chronic Disease Management of chronic health conditions 10/15/22:  Patient states she is checking blood sugars now and taking all medicines.  Awaiting denture resources-will follow  up Nurse Case Manager Clinical Goal(s):  Over the next 30 days, patient will work with CM team pharmacist to review current medications. Update 01/22/21:  Patient met with Pharmacist and continues to follow. Over the next 30 days, patient will meet with Nutritionist and check her blood sugars. Over the next 30 days, patient will attend all scheduled appointments.  Interventions:  Inter-disciplinary care team collaboration (see longitudinal plan of care) Evaluation of current treatment plan and patient's adherence to plan as established by provider. Advised patient to contact her PCP for any medication needs. Reviewed medications with patient. Collaborated with pharmacy regarding medications.  Discussed plans with patient for ongoing care management follow up and provided patient with direct contact information for care management team Pharmacy referral for medication review. Patient given phone number for Healthy [facility] transportation if needed. Will notify PCP of need for testing strips and dietician referral. Collaborated with PCP for cardiology appointment. Collaborated with  [name] for dental resources. [name] referral for dental resources  Patient Goals/Self-Care Activities Over the next 30 days, patient will:  -Patient will take medications as prescribed. Calls pharmacy for medication refills Calls provider office for new concerns or questions  Follow Up Plan: The Managed Medicaid care management team will reach out to the patient again over the next 30 business days.  The patient has been provided with contact information for the Managed Medicaid care management team and has been advised to call with any health related questions or concerns.

## 2022-10-15 NOTE — Patient Outreach (Signed)
Medicaid Managed Care   Nurse Care Manager Note  10/15/2022 Name:  Alison [name] MRN:  [id number] DOB:  1976-01-30  Alison [name] is an 47 y.o. year old female who is a primary patient of Bo Merino, FNP.  The [facility] Managed Care Coordination team was consulted for assistance with:    Chronic healthcare management needs, OSD, migraines DM, HTN, chronic pain. Anxiety/depression/Bipolar, PTSD, fibromyalgia, SLE, LDD, HLD, ostoearthritis, LBP, DLBIL  [name] was given information about Medicaid Managed Care Coordination team services today. Alison [name] Patient agreed to services and verbal consent obtained.  Engaged with patient by telephone for follow up visit in response to provider referral for case management and/or care coordination services.   Assessments/Interventions:  Review of past medical history, allergies, medications, health status, including review of consultants reports, laboratory and other test data, was performed as part of comprehensive evaluation and provision of chronic care management services.  SDOH (Social Determinants of Health) assessments and interventions performed: SDOH Interventions    Flowsheet Row Patient Outreach Telephone from 10/15/2022 in [place] Patient Outreach Telephone from 09/10/2022 in [place] Patient Outreach Telephone from 08/07/2022 in [place] Coordination Patient Outreach Telephone from 07/04/2022 in [place] Patient Outreach from 05/24/2022 in [place] Patient Outreach Telephone from 04/23/2022 in [place] Coordination  SDOH Interventions        Food Insecurity Interventions -- -- Intervention Not Indicated -- -- --  Housing Interventions -- -- -- Intervention Not Indicated -- --  Transportation Interventions -- --  Intervention Not Indicated -- -- --  Utilities Interventions -- -- -- Intervention Not Indicated -- --  Financial Strain Interventions Intervention Not Indicated -- -- -- -- Intervention Not Indicated  Physical Activity Interventions Other (Comments)  [patient does not feel like exercising right now] -- -- -- -- Intervention Not Indicated  Stress Interventions -- -- -- -- Provide Counseling  [patient sees Psychiatrist] --  Social Connections Interventions -- Intervention Not Indicated -- -- -- --     Care Plan  Allergies  Allergen Reactions   Penicillin G Itching, Rash, Anaphylaxis and Palpitations   Lamictal [Lamotrigine] Rash   Carbamazepine Other (See Comments)    Medication interaction-prograf    Hydrocodone-Acetaminophen Itching   Naproxen Itching   Penicillins     Medications Reviewed Today     Reviewed by [name], RN (Registered Nurse) on 10/15/22 at [place] List Status: <None>   Medication Order Taking? Sig Documenting Provider Last Dose Status Informant  baclofen (LIORESAL) 10 MG tablet [id number] No Take 10 mg by mouth 2 (two) times daily. [provider] Taking Active Self  benztropine (COGENTIN) 1 MG tablet [id number] No Take 1 tablet (1 mg total) by mouth daily as needed for tremors. [name], MD Taking Active   Cholecalciferol (VITAMIN D) 125 MCG (5000 UT) CAPS [id number] No Take 5,000 Units by mouth daily. [provider] Taking Active Self  clindamycin (CLEOCIN) 300 MG capsule [id number] No Take 300 mg by mouth every 6 (six) hours. [provider] Taking Active   CRANBERRY PO [id number] No Take 1 capsule by mouth in the morning and at bedtime. [provider] Taking Active Self  dronabinol (MARINOL) 2.5 MG capsule [id number] No Take 2.5 mg by mouth See admin instructions. Takes every other day [provider] Taking Active Self  Dulaglutide 4.5 MG/0.5ML  SOPN [id number] No Inject 4.5 mg into the skin once a week.  [provider] Taking Active   ezetimibe (ZETIA) 10 MG tablet [id number] No Take 10 mg by mouth every morning. [provider] Taking Active Self  fluticasone (FLONASE) 50 MCG/ACT nasal spray [id number] No Place into the nose. [provider] Taking Active   icosapent Ethyl (VASCEPA) 1 g capsule [id number] No Take 2 capsules (2 g total) by mouth 2 (two) times daily. [name], MD Taking Active Self           Med Note (HARPER, [name]   Tue Mar 19, 2022 10:24 AM) Taking 1 capsule twice daily per Dr. Ronnald Collum  lisinopril (ZESTRIL) 20 MG tablet [id number] No TAKE ONE TABLET BY MOUTH ONCE DAILY Bo Merino, FNP Taking Active   lurasidone (LATUDA) 20 MG TABS tablet [id number] No Take 1 tablet (20 mg total) by mouth daily with supper. [name], MD Taking Active   Magnesium 500 MG CAPS [id number] No Take 500 mg by mouth daily. [provider] Taking Active Self  Melatonin 10 MG TABS [id number] No Take 10 mg by mouth at bedtime. [provider] Taking Active Self  metFORMIN (GLUCOPHAGE) 1000 MG tablet [id number] No TAKE ONE TABLET BY MOUTH TWICE DAILY with meals Bo Merino, FNP Taking Active   mirtazapine (REMERON) 15 MG tablet [id number] No Take 1 tablet (15 mg total) by mouth at bedtime. [name], MD Taking Active   MYRBETRIQ 50 MG TB24 tablet [id number] No TAKE ONE TABLET BY MOUTH ONCE DAILY Vaillancourt, Samantha, PA-C Taking Active   naloxone [facility]) nasal spray 4 mg/0.1 mL [id number] No Place 0.4 mg into the nose once. [provider] Taking Active Self  pregabalin (LYRICA) 200 MG capsule [id number] No Take 1 capsule (200 mg total) by mouth 2 (two) times daily. [name], MD Taking Active   rosuvastatin (CRESTOR) 10 MG tablet [id number] No Take 1 tablet (10 mg total) by mouth daily. [name], MD Taking Active   tacrolimus (PROGRAF) 1 MG capsule [id number] No Take by mouth. [provider] Taking  Active   UBRELVY 50 MG TABS [id number] No Take 1 tablet by mouth daily as needed. [provider] Taking Active   venlafaxine XR (EFFEXOR XR) 37.5 MG 24 hr capsule [id number] No Take 1 capsule (37.5 mg total) by mouth daily with breakfast. Take along with 75 mg daily [name], [name], MD Taking Active   venlafaxine XR (EFFEXOR XR) 75 MG 24 hr capsule [id number] No Take 1 capsule (75 mg total) by mouth daily with breakfast. [name], MD Taking Active   XTAMPZA ER 9 MG C12A [id number]  Take 1 capsule by mouth every 12 (twelve) hours. [provider]  Active   XYLIMELTS 550 MG DISK [id number] No Take 550 mg by mouth every 8 (eight) hours as needed (dry mouth). [provider] Taking Active Self           Patient Active Problem List   Diagnosis Date Noted   Bipolar 1 disorder, depressed, moderate ([place]) 07/08/2022   Sjogren's syndrome ([place]) 05/27/2022   Encounter for screening colonoscopy    Menorrhagia with irregular cycle    Akathisia 10/24/2021   At risk for prolonged QT interval syndrome 09/12/2021   Prolonged Q-T interval on ECG 08/29/2021   Insomnia 07/20/2021   Bipolar disorder, in full remission, most recent episode mixed ([place]) 07/20/2021   Bipolar 1 disorder, mixed, mild ([place]) 05/09/2021   GAD (generalized anxiety disorder) 05/09/2021  Hepatic cirrhosis (HCC)    Opioid dependence with opioid-induced disorder ([place]) 12/20/2020   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   Bipolar 1 disorder, depressed, full remission ([place]) 09/27/2020   Suprapubic pain 07/25/2020   Bipolar 1 disorder, depressed, mild ([place]) 07/10/2020   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   Edema of lower extremity 09/16/2019   Carpal tunnel syndrome, left 07/26/2019   Cubital tunnel syndrome on left 07/26/2019   Bipolar I disorder, most recent episode depressed ([place]) 07/12/2019   PTSD (post-traumatic stress disorder) 41/32/4401   Umbilical  hernia without obstruction and without gangrene 06/17/2019   Encounter for surveillance of abnormal nevi 06/17/2019   Pineal gland cyst 06/17/2019   Chronic hip pain, left 06/02/2019   Lumbar spondylosis 06/02/2019   Mouth dryness 06/02/2019   Obesity (BMI 35.0-39.9 without comorbidity) 06/02/2019   Goals of care, counseling/discussion 05/18/2019   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[place]) 05/18/2019   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   Occipital neuralgia 05/13/2019   Plantar fasciitis of left foot 04/27/2019   Fibromyalgia 03/23/2019   Systemic lupus erythematosus (SLE) in adult [facility]) 03/23/2019   Essential hypertension 03/23/2019   Hepatitis 03/23/2019   Mass of left kidney 03/23/2019   Diabetes mellitus ([place]) 03/23/2019   Nausea without vomiting 03/23/2019   Neuropathy 03/23/2019   Cancer ([place]) 03/23/2019   Hyperlipidemia 03/23/2019   Osteoarthritis 03/23/2019   Trigeminal neuralgia 03/23/2019   Chronic renal disease, stage III ([place]) 03/23/2019   Nephrolithiasis 03/23/2019   Migraine 03/23/2019   OSA on CPAP 03/23/2019   Fibrocystic breast 03/23/2019   Heart murmur 03/23/2019   Bipolar disorder, in partial remission, most recent episode depressed ([place]) 07/02/2017   Abnormal uterine bleeding 03/18/2017   Major depressive disorder, recurrent ([place]) 03/18/2017   Morbid obesity ([place]) 03/18/2017   Secondary hyperparathyroidism ([place]) 02/07/2017   Neuropathic pain 11/19/2016   Post herpetic neuralgia 11/19/2016   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  Anxiety/depression/Bipolar, PTSD, fibromyalgia, SLE, LDD, HLD, ostoearthritis, LBP, DLBCL  Care Plan : Chronic Pain (Adult)  Updates made by [name], RN since 10/15/2022 12:00 AM     Problem: Chronic Pain Management-fibromyalgia   Priority: High  Onset Date: 01/22/2021     Long-Range Goal: Fibromyalgia pain managed-new pain management provider   Start Date: 08/14/2020  Expected End Date: 12/10/2022  Recent Progress: On track  Priority: High  Note:   Current Barriers:  Knowledge deficits related to pain management  09/10/22:  Pain controlled right now per patient-Dr. Humphrey Rolls recently added Marinol and has helped her pain and facial pain. 10/15/22:  patient with fibromyalgia pain only right now due to weather  Nurse Case Manager Clinical Goal(s):  Over the next 45 days, patient will work with [facility] to address needs related to referral for pain management provider and associated care coordination needs. 05/24/22:  patient is currently seeing Dr. Humphrey Rolls at [facility].  Interventions:  Inter-disciplinary care team collaboration (see longitudinal plan of care) Evaluation of current treatment plan related to fibromyalgia  and patient's adherence to plan as established by provider. Update 06/15/21:  Patient taking Baclofen  again, Effexor added. Collaborated with primary care provider regarding recommendations and referral to pain management provider. Discussed plans with patient for ongoing care management follow up and provided patient with direct contact information for care management team Anticipate pain education program, pain management support as part of pain management referral.       Patient Goals/Self-Care Activities Over the next 45 days, patient will:  -Attends all scheduled provider appointments  develop a personal pain management plan with your pain management provider.  Follow Up Plan:  The Managed Medicaid care management team will reach out to the patient  again over the next 30  business days.    Care Plan : Wellness (Adult)  Updates made by [name], RN since 10/15/2022 12:00 AM     Problem: Medication Adherence (Wellness)   Priority: High  Onset Date: 01/22/2021     Long-Range Goal: Medication Adherence Maintained   Start Date: 09/06/2020  Expected End Date: 12/10/2022  Recent Progress: On track  Priority: High  Note:   Current Barriers:  Chronic Disease Management of chronic health conditions 10/15/22:  Patient states she is checking blood sugars now and taking all medicines.  Awaiting denture resources-will follow up Nurse Case Manager Clinical Goal(s):  Over the next 30 days, patient will work with CM team pharmacist to review current medications. Update 01/22/21:  Patient met with Pharmacist and continues to follow. Over the next 30 days, patient will meet with Nutritionist and check her blood sugars. Over the next 30 days, patient will attend all scheduled appointments.  Interventions:  Inter-disciplinary care team collaboration (see longitudinal plan of care) Evaluation of current treatment plan and patient's adherence to plan as established by provider. Advised patient to contact her PCP for any medication needs. Reviewed medications with patient. Collaborated with pharmacy regarding medications.  Discussed plans with patient for ongoing care management follow up and provided patient with direct contact information for care management team Pharmacy referral for medication review. Patient given phone number for [facility] transportation if needed. Will notify PCP of need for testing strips and dietician referral. Collaborated with PCP for cardiology appointment. Collaborated with [name] for dental resources. [name] referral for dental resources  Patient Goals/Self-Care Activities Over the next 30 days, patient will:  -Patient will take medications as prescribed. Calls pharmacy for medication refills Calls  provider office for new concerns or questions  Follow Up Plan: The Managed Medicaid care management team will reach out to the patient again over the next 30 business days.  The patient has been provided with contact information for the Managed Medicaid care management team and has been advised to call with any health related questions or concerns.    Follow Up:  Patient agrees to Care Plan and Follow-up.  Plan: The Managed Medicaid care management team will reach out to the patient again over the next 30 business  days. and The  Patient has been provided with contact information for the Managed Medicaid care management team and has been advised to call with any health related questions or concerns.  Date/time of next scheduled RN care management/care coordination outreach: 11/11/22 at 115

## 2022-10-21 ENCOUNTER — Emergency Department
Admission: EM | Admit: 2022-10-21 | Discharge: 2022-10-21 | Disposition: A | Discharge status: Home / Self Care | Payer: Medicaid Other | Attending: Student in an Organized Health Care Education/Training Program | Admitting: Student in an Organized Health Care Education/Training Program

## 2022-10-21 ENCOUNTER — Other Ambulatory Visit: Payer: Self-pay

## 2022-10-21 ENCOUNTER — Encounter: Payer: Self-pay | Admitting: Emergency Medicine

## 2022-10-21 ENCOUNTER — Emergency Department: Payer: Medicaid Other

## 2022-10-21 DIAGNOSIS — R0789 Other chest pain: Secondary | ICD-10-CM | POA: Insufficient documentation

## 2022-10-21 DIAGNOSIS — R079 Chest pain, unspecified: Secondary | ICD-10-CM

## 2022-10-21 DIAGNOSIS — M546 Pain in thoracic spine: Secondary | ICD-10-CM | POA: Diagnosis not present

## 2022-10-21 DIAGNOSIS — M62830 Muscle spasm of back: Secondary | ICD-10-CM | POA: Diagnosis not present

## 2022-10-21 LAB — URINALYSIS, ROUTINE W REFLEX MICROSCOPIC
Bacteria, UA: NONE SEEN
Bilirubin Urine: NEGATIVE
Glucose, UA: 50 mg/dL — AB
Hgb urine dipstick: NEGATIVE
Ketones, ur: NEGATIVE mg/dL
Leukocytes,Ua: NEGATIVE
Nitrite: NEGATIVE
Protein, ur: 100 mg/dL — AB
Specific Gravity, Urine: 1.017 (ref 1.005–1.030)
Squamous Epithelial / HPF: NONE SEEN /HPF (ref 0–5)
pH: 6 (ref 5.0–8.0)

## 2022-10-21 LAB — COMPREHENSIVE METABOLIC PANEL
ALT: 20 U/L (ref 0–44)
AST: 35 U/L (ref 15–41)
Albumin: 4 g/dL (ref 3.5–5.0)
Alkaline Phosphatase: 59 U/L (ref 38–126)
Anion gap: 8 (ref 5–15)
BUN: 19 mg/dL (ref 6–20)
CO2: 27 mmol/L (ref 22–32)
Calcium: 9.5 mg/dL (ref 8.9–10.3)
Chloride: 101 mmol/L (ref 98–111)
Creatinine, Ser: 1.12 mg/dL — ABNORMAL HIGH (ref 0.44–1.00)
GFR, Estimated: >60 mL/min (ref >60)
Glucose, Bld: 298 mg/dL — ABNORMAL HIGH (ref 70–99)
Potassium: 4.8 mmol/L (ref 3.5–5.1)
Sodium: 136 mmol/L (ref 135–145)
Total Bilirubin: 0.6 mg/dL (ref 0.3–1.2)
Total Protein: 7.8 g/dL (ref 6.5–8.1)

## 2022-10-21 LAB — CBC WITH DIFFERENTIAL/PLATELET
Abs Immature Granulocytes: 0.03 10*3/uL (ref 0.00–0.07)
Basophils Absolute: 0.1 10*3/uL (ref 0.0–0.1)
Basophils Relative: 1 %
Eosinophils Absolute: 0.4 10*3/uL (ref 0.0–0.5)
Eosinophils Relative: 5 %
HCT: 41.8 % (ref 36.0–46.0)
Hemoglobin: 13.8 g/dL (ref 12.0–15.0)
Immature Granulocytes: 0 %
Lymphocytes Relative: 34 %
Lymphs Abs: 2.9 10*3/uL (ref 0.7–4.0)
MCH: 28.3 pg (ref 26.0–34.0)
MCHC: 33 g/dL (ref 30.0–36.0)
MCV: 85.8 fL (ref 80.0–100.0)
Monocytes Absolute: 0.7 10*3/uL (ref 0.1–1.0)
Monocytes Relative: 8 %
Neutro Abs: 4.4 10*3/uL (ref 1.7–7.7)
Neutrophils Relative %: 52 %
Platelets: 144 10*3/uL — ABNORMAL LOW (ref 150–400)
RBC: 4.87 MIL/uL (ref 3.87–5.11)
RDW: 13 % (ref 11.5–15.5)
WBC: 8.6 10*3/uL (ref 4.0–10.5)
nRBC: 0 % (ref 0.0–0.2)

## 2022-10-21 LAB — TROPONIN I (HIGH SENSITIVITY)
Troponin I (High Sensitivity): 6 ng/L (ref <18)
Troponin I (High Sensitivity): 5 ng/L (ref <18)

## 2022-10-21 LAB — POC URINE PREG, ED: Preg Test, Ur: NEGATIVE

## 2022-10-21 MED ORDER — METHOCARBAMOL 500 MG PO TABS: 500.0000 mg | ORAL_TABLET | Freq: Four times a day (QID) | ORAL | 0 refills | Status: DC

## 2022-10-21 MED ORDER — KETOROLAC TROMETHAMINE 30 MG/ML IJ SOLN
30.0000 mg | Freq: Once | INTRAMUSCULAR | Status: AC
  Administered 2022-10-21: 30 mg via INTRAVENOUS
  Filled 2022-10-21: qty 1

## 2022-10-21 MED ORDER — METHOCARBAMOL 500 MG PO TABS
1000.0000 mg | ORAL_TABLET | Freq: Once | ORAL | Status: AC
  Administered 2022-10-21: 1000 mg via ORAL
  Filled 2022-10-21: qty 2

## 2022-10-21 MED ORDER — DEXAMETHASONE SODIUM PHOSPHATE 10 MG/ML IJ SOLN
10.0000 mg | Freq: Once | INTRAMUSCULAR | Status: AC
  Administered 2022-10-21: 10 mg via INTRAMUSCULAR
  Filled 2022-10-21: qty 1

## 2022-10-21 MED ORDER — MELOXICAM 15 MG PO TABS: 15.0000 mg | ORAL_TABLET | Freq: Every day | ORAL | 0 refills | Status: DC

## 2022-10-21 NOTE — ED Provider Notes (Signed)
[facility] Provider Note  Patient Contact: 6:27 PM (approximate)   History   Chest Pain   HPI  Alison [name] is a 47 y.o. female Presents the emergency department complaining of right-sided chest pain radiating down the right arm.  Symptoms began today.  No cardiac history.  Patient denies any fevers, chills, nasal congestion, sore throat, cough.  Nonpleuritic in nature.  Appears to be positional and reproducible.  No GI symptoms of nausea, vomiting, diarrhea.  Patient has not tried any alleviating factors at home.  Significant medical history to include fibromyalgia, lupus, diabetes, chronic kidney disease, liver transplant, lymphoma, bipolar disorder, cirrhosis, general anxiety disease.     Physical Exam   Triage Vital Signs: ED Triage Vitals  Enc Vitals Group     BP 10/21/22 1702 (!) 144/89     Pulse Rate 10/21/22 1702 82     Resp 10/21/22 1702 17     Temp 10/21/22 1702 98.5 F (36.9 C)     Temp Source 10/21/22 1702 Oral     SpO2 10/21/22 1702 94 %     Weight 10/21/22 1700 250 lb (113.4 kg)     Height --      Head Circumference --      Peak Flow --      Pain Score 10/21/22 1700 6     Pain Loc --      Pain Edu? --      Excl. in [place]? --     Most recent vital signs: Vitals:   10/21/22 1702 10/21/22 2024  BP: (!) 144/89 125/83  Pulse: 82 73  Resp: 17 19  Temp: 98.5 F (36.9 C) 99.1 F (37.3 C)  SpO2: 94% 96%     General: Alert and in no acute distress.  Cardiovascular:  Good peripheral perfusion.  Normal S1 and S2 with no appreciable murmurs, rubs, gallops. Respiratory: Normal respiratory effort without tachypnea or retractions. Lungs CTAB. Good air entry to the bases with no decreased or absent breath sounds Gastrointestinal: Bowel sounds 4 quadrants. Soft and nontender to palpation. No guarding or rigidity. No palpable masses. No distention. No CVA tenderness. Musculoskeletal: Full range of motion to all extremities.  Neurologic:   No gross focal neurologic deficits are appreciated.  Skin:   No rash noted Other:   ED Results / Procedures / Treatments   Labs (all labs ordered are listed, but only abnormal results are displayed) Labs Reviewed  COMPREHENSIVE METABOLIC PANEL - Abnormal; Notable for the following components:      Result Value   Glucose, Bld 298 (*)    Creatinine, Ser 1.12 (*)    All other components within normal limits  CBC WITH DIFFERENTIAL/PLATELET - Abnormal; Notable for the following components:   Platelets 144 (*)    All other components within normal limits  URINALYSIS, ROUTINE W REFLEX MICROSCOPIC - Abnormal; Notable for the following components:   Color, Urine YELLOW (*)    APPearance CLEAR (*)    Glucose, UA 50 (*)    Protein, ur 100 (*)    All other components within normal limits  POC URINE PREG, ED  TROPONIN I (HIGH SENSITIVITY)  TROPONIN I (HIGH SENSITIVITY)     EKG She had ED ECG REPORT I, Charline Bills Everette Dimauro,  personally viewed and interpreted this ECG.   Date: 10/21/2020  EKG Time: 1701 hrs.  Rate: 82 bpm bleeding  Rhythm: unchanged from previous tracings, normal sinus rhythm, RBBB  Axis: Left axis deviations  Intervals:right bundle branch block  ST&T Change: No ST elevation or depression noted    RADIOLOGY  I personally viewed, evaluated, and interpreted these images as part of my medical decision making, as well as reviewing the written report by the radiologist.  ED Provider Interpretation: Chest x-ray without acute cardiopulmonary abnormality  DG Chest 2 View  Result Date: 10/21/2022 CLINICAL DATA:  cp EXAM: CHEST - 2 VIEW COMPARISON:  Chest x-ray 11/03/2020 FINDINGS: Left chest wall Port-A-Cath with tip overlying the expected region of the superior cavoatrial junction. The heart and mediastinal contours are unchanged. Aortic calcification. No focal consolidation. No pulmonary edema. No pleural effusion. No pneumothorax. No acute osseous abnormality.  IMPRESSION: No active cardiopulmonary disease. Electronically Signed   By: Iven Finn M.D.   On: 10/21/2022 19:04    PROCEDURES:  Critical Care performed: No  Procedures   MEDICATIONS ORDERED IN ED: Medications  ketorolac (TORADOL) 30 MG/ML injection 30 mg (has no administration in time range)  dexamethasone (DECADRON) injection 10 mg (has no administration in time range)  methocarbamol (ROBAXIN) tablet 1,000 mg (has no administration in time range)     IMPRESSION / MDM / ASSESSMENT AND PLAN / ED COURSE  I reviewed the triage vital signs and the nursing notes.                                 Differential diagnosis includes, but is not limited to, STEMI, NSTEMI, costochondritis, pneumonia, bronchitis, pneumothorax, costochondritis, muscle spasm  Patient's presentation is most consistent with acute presentation with potential threat to life or bodily function.   Patient's diagnosis is consistent with nonspecific chest pain, thoracic back pain.  Patient presents emergency department complaining of chest pain on the right side that seem to run down the right arm.  Patient with no history of previous MI.  Patient had labs, EKG, chest x-ray.  Findings are overall reassuring.  Glucose was slightly up but labs are otherwise reassuring in regards to troponins.  Chest x-ray without acute cardiopulmonary abnormality.  EKG is reassuring.  Patient had reproduction of her symptoms with palpation of the chest wall and over the thoracic back.  I suspect that this is a component of radicular pain secondary to muscle spasm in her thoracic spine.  Will treat with anti-inflammatory muscle relaxer.  Concerning signs and symptoms and return precautions discussed with the patient at this time..  Patient is given ED precautions to return to the ED for any worsening or new symptoms.     FINAL CLINICAL IMPRESSION(S) / ED DIAGNOSES   Final diagnoses:  Nonspecific chest pain  Spasm of thoracic back  muscle     Rx / DC Orders   ED Discharge Orders          Ordered    meloxicam (MOBIC) 15 MG tablet  Daily        10/21/22 2111    methocarbamol (ROBAXIN) 500 MG tablet  4 times daily        10/21/22 2111             Note:  This document was prepared using Dragon voice recognition software and may include unintentional dictation errors.   Brynda Peon 10/21/22 2111    Merlyn Lot, MD 10/21/22 2157

## 2022-10-21 NOTE — ED Triage Notes (Signed)
Pt sts that she is having right sided chest pain. Pt sts that the pain started around 1400 today. Pt sts that the pain is radiating down her right arm.

## 2022-10-28 DIAGNOSIS — M79669 Pain in unspecified lower leg: Secondary | ICD-10-CM | POA: Diagnosis not present

## 2022-10-28 DIAGNOSIS — M5414 Radiculopathy, thoracic region: Secondary | ICD-10-CM | POA: Diagnosis not present

## 2022-10-28 DIAGNOSIS — M79603 Pain in arm, unspecified: Secondary | ICD-10-CM | POA: Diagnosis not present

## 2022-10-28 DIAGNOSIS — M79606 Pain in leg, unspecified: Secondary | ICD-10-CM | POA: Diagnosis not present

## 2022-10-28 DIAGNOSIS — I10 Essential (primary) hypertension: Secondary | ICD-10-CM | POA: Diagnosis not present

## 2022-10-28 DIAGNOSIS — G893 Neoplasm related pain (acute) (chronic): Secondary | ICD-10-CM | POA: Diagnosis not present

## 2022-10-28 DIAGNOSIS — G894 Chronic pain syndrome: Secondary | ICD-10-CM | POA: Diagnosis not present

## 2022-10-28 DIAGNOSIS — M25519 Pain in unspecified shoulder: Secondary | ICD-10-CM | POA: Diagnosis not present

## 2022-10-28 DIAGNOSIS — M5442 Lumbago with sciatica, left side: Secondary | ICD-10-CM | POA: Diagnosis not present

## 2022-10-28 DIAGNOSIS — F3189 Other bipolar disorder: Secondary | ICD-10-CM | POA: Diagnosis not present

## 2022-10-28 DIAGNOSIS — Z79891 Long term (current) use of opiate analgesic: Secondary | ICD-10-CM | POA: Diagnosis not present

## 2022-10-30 ENCOUNTER — Other Ambulatory Visit: Payer: Self-pay | Admitting: Psychiatry

## 2022-10-30 DIAGNOSIS — G4701 Insomnia due to medical condition: Secondary | ICD-10-CM

## 2022-10-30 DIAGNOSIS — F4310 Post-traumatic stress disorder, unspecified: Secondary | ICD-10-CM

## 2022-10-30 DIAGNOSIS — F411 Generalized anxiety disorder: Secondary | ICD-10-CM

## 2022-11-11 ENCOUNTER — Other Ambulatory Visit: Payer: Medicaid Other | Admitting: Obstetrics and Gynecology

## 2022-11-11 ENCOUNTER — Encounter: Payer: Self-pay | Admitting: Obstetrics and Gynecology

## 2022-11-11 NOTE — Patient Instructions (Signed)
Hi Ms. Boudreau, thanks for speaking with me, I hope your sewing goes well!  Ms. Galster was given information about Medicaid Managed Care team care coordination services as a part of their Healthy [facility] Medicaid benefit. [name] verbally consented to engagement with the [facility] Managed Care team.   If you are experiencing a medical emergency, please call 911 or report to your local emergency department or urgent care.   If you have a non-emergency medical problem during routine business hours, please contact your provider's office and ask to speak with a nurse.   For questions related to your Healthy [facility] health plan, please call: [phone] or visit the homepage here: [email or web address]  If you would like to schedule transportation through your Healthy [facility] plan, please call the following number at least 2 days in advance of your appointment: [phone]  For information about your ride after you set it up, call Ride Assist at [phone]. Use this number to activate a Will Call pickup, or if your transportation is late for a scheduled pickup. Use this number, too, if you need to make a change or cancel a previously scheduled reservation.  If you need transportation services right away, call [phone]. The after-hours call center is staffed 24 hours to handle ride assistance and urgent reservation requests (including discharges) 365 days a year. Urgent trips include sick visits, hospital discharge requests and life-sustaining treatment.  Call the [facility] at [phone], at any time, 24 hours a day, 7 days a week. If you are in danger or need immediate medical attention call 911.  If you would like help to quit smoking, call 1-800-QUIT-NOW 304-421-8141) OR Espaol: 1-855-Djelo-Ya (2-330-076-2263) o para ms informacin haga clic aqu or Text READY to 200-400 to register via text  Ms. Mcreynolds -  following are the goals we discussed in your visit today:   Goals Addressed             This Visit's Progress    Chronic Pain Managed       11/11/22:  patient with joint pain-managed with moist heat per patient    Get sugars at goal       Timeframe:  Short-Term Goal Priority:  High Start Date:                             Expected End Date:      ongoing                 Follow Up Date: 12/11/22:    - call for medicine refill 2 or 3 days before it runs out - call if I am sick and can't take my medicine - keep a list of all the medicines I take; vitamins and herbals too    Why is this important?   These steps will help you keep on track with your medicines. 11/11/22:  patient states blood sugars 180-200.  Taking all medicines per patient   Patient verbalizes understanding of instructions and care plan provided today and agrees to view in [place]. Active MyChart status and patient understanding of how to access instructions and care plan via MyChart confirmed with patient.     The Managed Medicaid care management team will reach out to the patient again over the next 30 business  days.  The  Patient has been provided with contact information for the Managed Medicaid care management team and has been advised to call  with any health related questions or concerns.   [name], [name] [place] Management Coordinator - Managed Medicaid High Risk [phone]   Following is a copy of your plan of care:  Care Plan : Chronic Pain (Adult)  Updates made by [name], RN since 11/11/2022 12:00 AM     Problem: Chronic Pain Management-fibromyalgia   Priority: High  Onset Date: 01/22/2021     Long-Range Goal: Fibromyalgia pain managed-new pain management provider   Start Date: 08/14/2020  Expected End Date: 02/09/2023  Recent Progress: On track  Priority: High  Note:   Current Barriers:  Knowledge deficits related to pain management  09/10/22:  Pain  controlled right now per patient-Dr. Humphrey Rolls recently added Marinol and has helped her pain and facial pain. 11/11/22:  patient with joint pain-is managed with moist heat  Nurse Case Manager Clinical Goal(s):  Over the next 45 days, patient will work with [facility] to address needs related to referral for pain management provider and associated care coordination needs. 05/24/22:  patient is currently seeing Dr. Humphrey Rolls at [facility].  Interventions:  Inter-disciplinary care team collaboration (see longitudinal plan of care) Evaluation of current treatment plan related to fibromyalgia  and patient's adherence to plan as established by provider. Update 06/15/21:  Patient taking Baclofen again, Effexor added. Collaborated with primary care provider regarding recommendations and referral to pain management provider. Discussed plans with patient for ongoing care management follow up and provided patient with direct contact information for care management team Anticipate pain education program, pain management support as part of pain management referral.       Patient Goals/Self-Care Activities Over the next 45 days, patient will:  -Attends all scheduled provider appointments  develop a personal pain management plan with your pain management provider.  Follow Up Plan:  The Managed Medicaid care management team will reach out to the patient again over the next 30  business days.    Care Plan : Wellness (Adult)  Updates made by [name], RN since 11/11/2022 12:00 AM     Problem: Medication Adherence (Wellness)   Priority: High  Onset Date: 01/22/2021     Long-Range Goal: Medication Adherence Maintained   Start Date: 09/06/2020  Expected End Date: 02/09/2023  Recent Progress: On track  Priority: High  Note:   Current Barriers:  Chronic Disease Management of chronic health conditions, OSD, migraines, DM, HTN, chronic pain, anxiety/depression/ Bipolar/PTSD, SLE, LDD, HLD,  fibromyalgia, osteoarthritis, LBP, DLBCL, liver transplant, lymphoma 11/11/22:  patient with joint pain today, managed with moist heat.  Blood sugars 180-200 per patient.  BP WNL.  Has follow up appts with PCP and ONC scheduled.  Followed by Psychiatry on regular basis.  Nurse Case Manager Clinical Goal(s):  Over the next 30 days, patient will work with CM team pharmacist to review current medications. Update 01/22/21:  Patient met with Pharmacist and continues to follow. Over the next 30 days, patient will meet with Nutritionist and check her blood sugars. Over the next 30 days, patient will attend all scheduled appointments.  Interventions:  Inter-disciplinary care team collaboration (see longitudinal plan of care) Evaluation of current treatment plan and patient's adherence to plan as established by provider. Advised patient to contact her PCP for any medication needs. Reviewed medications with patient. Collaborated with pharmacy regarding medications.  Discussed plans with patient for ongoing care management follow up and provided patient with direct contact information for care management team  Pharmacy referral for medication review. Patient given phone number for [facility] transportation if needed. Will notify PCP of need for testing strips and dietician referral. Collaborated with PCP for cardiology appointment. Collaborated with [name] for dental resources. [name] referral for dental resources  Diabetes Interventions:  (Status:  New goal.) Long Term Goal Assessed patient's understanding of A1c goal: <7% Reviewed medications with patient and discussed importance of medication adherence Counseled on importance of regular laboratory monitoring as prescribed Discussed plans with patient for ongoing care management follow up and provided patient with direct contact information for care management team Reviewed scheduled/upcoming provider appointments Advised patient,  providing education and rationale, to check cbg as directed and record, calling provider for findings outside established parameters Review of patient status, including review of consultants reports, relevant laboratory and other test results, and medications completed Assessed social determinant of health barriers Lab Results  Component Value Date   HGBA1C 6.4 03/06/2022  Hyperlipidemia Interventions:  (Status:  New goal.) Long Term Goal Medication review performed; medication list updated in electronic medical record.  Provider established cholesterol goals reviewed Reviewed importance of limiting foods high in cholesterol Assessed social determinant of health barriers   Hypertension Interventions:  (Status:  New goal.) Long Term Goal Last practice recorded BP readings:  BP Readings from Last 3 Encounters:  10/21/22 (!) 142/106  09/18/22 113/85  06/21/22 104/62   Most recent eGFR/CrCl:  Lab Results  Component Value Date   EGFR 29 (L) 05/27/2022    No components found for: "CRCL"  Evaluation of current treatment plan related to hypertension self management and patient's adherence to plan as established by provider Reviewed medications with patient and discussed importance of compliance Discussed plans with patient for ongoing care management follow up and provided patient with direct contact information for care management team Advised patient, providing education and rationale, to monitor blood pressure daily and record, calling PCP for findings outside established parameters Reviewed scheduled/upcoming provider appointments including:  Provided education on prescribed diet Discussed complications of poorly controlled blood pressure such as heart disease, stroke, circulatory complications, vision complications, kidney impairment, sexual dysfunction Assessed social determinant of health barriers  Pain Interventions:  (Status:  New goal.) Long Term Goal Pain assessment  performed Medications reviewed Reviewed provider established plan for pain management Discussed importance of adherence to all scheduled medical appointments Counseled on the importance of reporting any/all new or changed pain symptoms or management strategies to pain management provider Advised patient to report to care team affect of pain on daily activities Discussed use of relaxation techniques and/or diversional activities to assist with pain reduction (distraction, imagery, relaxation, massage, acupressure, TENS, heat, and cold application Reviewed with patient prescribed pharmacological and nonpharmacological pain relief strategies Assessed social determinant of health barriers   Patient Goals/Self-Care Activities Over the next 30 days, patient will:  -Patient will take medications as prescribed. Calls pharmacy for medication refills Calls provider office for new concerns or questions  Follow Up Plan: The Managed Medicaid care management team will reach out to the patient again over the next 30 business days.  The patient has been provided with contact information for the Managed Medicaid care management team and has been advised to call with any health related questions or concerns.

## 2022-11-11 NOTE — Patient Outreach (Signed)
Medicaid Managed Care   Nurse Care Manager Note  11/11/2022 Name:  Alison [name] MRN:  [id number] DOB:  [birth year]  Alison [name] is an 47 y.o. year old female who is a primary patient of Bo Merino, FNP.  [facility] Managed Care Coordination team was consulted for assistance with:    Chronic healthcare management needs, OSD, migraines, HTN, DM, chronic pain, anxiety, depression, bipolar, PTSD, fibromyalgia, SLE, LDD, HLD, osteoarthritis, LBP, DLBCL, liver transplant, lymphoma  Alison [name] was given information about Medicaid Managed Care Coordination team services today. Alison [name] Patient agreed to services and verbal consent obtained.  Engaged with patient by telephone for follow up visit in response to provider referral for case management and/or care coordination services.   Assessments/Interventions:  Review of past medical history, allergies, medications, health status, including review of consultants reports, laboratory and other test data, was performed as part of comprehensive evaluation and provision of chronic care management services.  SDOH (Social Determinants of Health) assessments and interventions performed: SDOH Interventions    Flowsheet Row Patient Outreach Telephone from 11/11/2022 in [place] Patient Outreach Telephone from 10/15/2022 in [place] Patient Outreach Telephone from 09/10/2022 in [place] Patient Outreach Telephone from 08/07/2022 in [place] Patient Outreach Telephone from 07/04/2022 in [place] Patient Outreach from 05/24/2022 in [place] Coordination  SDOH Interventions        Food Insecurity Interventions -- -- -- Intervention Not Indicated -- --  Housing Interventions -- -- -- -- Intervention Not Indicated --  Transportation  Interventions -- -- -- Intervention Not Indicated -- --  Utilities Interventions -- -- -- -- Intervention Not Indicated --  Alcohol Usage Interventions Intervention Not Indicated (Score <7) -- -- -- -- --  Financial Strain Interventions -- Intervention Not Indicated -- -- -- --  Physical Activity Interventions -- Other (Comments)  [patient does not feel like exercising right now] -- -- -- --  Stress Interventions Provide Counseling  [sees Psychiatrist on regular basis] -- -- -- -- Provide Counseling  [patient sees Psychiatrist]  Social Connections Interventions -- -- Intervention Not Indicated -- -- --     Care Plan  Allergies  Allergen Reactions   Penicillin G Itching, Rash, Anaphylaxis and Palpitations   Lamictal [Lamotrigine] Rash   Carbamazepine Other (See Comments)    Medication interaction-prograf    Hydrocodone-Acetaminophen Itching   Naproxen Itching   Penicillins     Medications Reviewed Today     Reviewed by [name], RN (Registered Nurse) on 11/11/22 at 1411  Med List Status: <None>   Medication Order Taking? Sig Documenting Provider Last Dose Status Informant  baclofen (LIORESAL) 10 MG tablet [id number] No Take 10 mg by mouth 2 (two) times daily. [provider] Taking Active Self  benztropine (COGENTIN) 1 MG tablet [id number] No Take 1 tablet (1 mg total) by mouth daily as needed for tremors. [name], MD Taking Active   Cholecalciferol (VITAMIN D) 125 MCG (5000 UT) CAPS [id number] No Take 5,000 Units by mouth daily. [provider] Taking Active Self  clindamycin (CLEOCIN) 300 MG capsule [id number] No Take 300 mg by mouth every 6 (six) hours. [provider] Taking Active   CRANBERRY PO [id number] No Take 1 capsule by mouth in the morning and at bedtime. [provider] Taking Active Self  dronabinol (MARINOL) 2.5 MG capsule [id number] No Take  2.5 mg by mouth See admin instructions. Takes every other day [provider] Taking Active Self  Dulaglutide 4.5 MG/0.5ML SOPN [id number] No Inject 4.5 mg into the skin once a week. [provider] Taking Active   ezetimibe (ZETIA) 10 MG tablet [id number] No Take 10 mg by mouth every morning. [provider] Taking Active Self  fluticasone (FLONASE) 50 MCG/ACT nasal spray [id number] No Place into the nose. [provider] Taking Active   icosapent Ethyl (VASCEPA) 1 g capsule [id number] No Take 2 capsules (2 g total) by mouth 2 (two) times daily. [name], MD Taking Active Self           Med Note (HARPER, [name]   Tue Mar 19, 2022 10:24 AM) Taking 1 capsule twice daily per Dr. Ronnald Collum  lisinopril (ZESTRIL) 20 MG tablet [id number] No TAKE ONE TABLET BY MOUTH ONCE DAILY Bo Merino, FNP Taking Active   lurasidone (LATUDA) 20 MG TABS tablet [id number] No Take 1 tablet (20 mg total) by mouth daily with supper. [name], MD Taking Active   Magnesium 500 MG CAPS [id number] No Take 500 mg by mouth daily. [provider] Taking Active Self  Melatonin 10 MG TABS [id number] No Take 10 mg by mouth at bedtime. [provider] Taking Active Self  meloxicam (MOBIC) 15 MG tablet [id number]  Take 1 tablet (15 mg total) by mouth daily. Darletta Moll, PA-C  Active   metFORMIN (GLUCOPHAGE) 1000 MG tablet [id number] No TAKE ONE TABLET BY MOUTH TWICE DAILY with meals Bo Merino, FNP Taking Active   methocarbamol (ROBAXIN) 500 MG tablet [id number]  Take 1 tablet (500 mg total) by mouth 4 (four) times daily. Cuthriell, Charline Bills, PA-C  Active   mirtazapine (REMERON) 15 MG tablet [id number]  TAKE ONE TABLET BY MOUTH EVERYDAY AT BEDTIME [name], MD  Active   MYRBETRIQ 50 MG TB24 tablet [id number] No TAKE ONE TABLET BY MOUTH ONCE DAILY Vaillancourt, Samantha, PA-C Taking Active   naloxone (NARCAN) nasal spray 4 mg/0.1 mL [id number] No Place 0.4 mg into the nose once. [provider] Taking Active Self   pregabalin (LYRICA) 200 MG capsule [id number] No Take 1 capsule (200 mg total) by mouth 2 (two) times daily. [name], MD Taking Active   rosuvastatin (CRESTOR) 10 MG tablet [id number] No Take 1 tablet (10 mg total) by mouth daily. [name], MD Taking Active   tacrolimus (PROGRAF) 1 MG capsule [id number] No Take by mouth. [provider] Taking Active   UBRELVY 50 MG TABS [id number] No Take 1 tablet by mouth daily as needed. [provider] Taking Active   venlafaxine XR (EFFEXOR XR) 37.5 MG 24 hr capsule [id number] No Take 1 capsule (37.5 mg total) by mouth daily with breakfast. Take along with 75 mg daily [name], [name], MD Taking Active   venlafaxine XR (EFFEXOR XR) 75 MG 24 hr capsule [id number] No Take 1 capsule (75 mg total) by mouth daily with breakfast. [name], MD Taking Active   XTAMPZA ER 9 MG C12A [id number]  Take 1 capsule by mouth every 12 (twelve) hours. [provider]  Active   XYLIMELTS 550 MG DISK [id number] No Take 550 mg by mouth every 8 (eight) hours as needed (dry mouth). [provider] Taking Active Self           Patient Active Problem List   Diagnosis Date Noted   Bipolar 1 disorder, depressed, moderate ([place]) 07/08/2022   Sjogren's syndrome ([place])  05/27/2022   Encounter for screening colonoscopy    Menorrhagia with irregular cycle    Akathisia 10/24/2021   At risk for prolonged QT interval syndrome 09/12/2021   Prolonged Q-T interval on ECG 08/29/2021   Insomnia 07/20/2021   Bipolar disorder, in full remission, most recent episode mixed ([place]) 07/20/2021   Bipolar 1 disorder, mixed, mild ([place]) 05/09/2021   GAD (generalized anxiety disorder) 05/09/2021   Hepatic cirrhosis ([place])    Opioid dependence with opioid-induced disorder ([place]) 12/20/2020   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[place]) 12/20/2020   Bipolar 1 disorder, depressed, full remission ([place]) 09/27/2020   Suprapubic pain 07/25/2020    Bipolar 1 disorder, depressed, mild ([place]) 07/10/2020   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   Edema of lower extremity 09/16/2019   Carpal tunnel syndrome, left 07/26/2019   Cubital tunnel syndrome on left 07/26/2019   Bipolar I disorder, most recent episode depressed ([place]) 07/12/2019   PTSD (post-traumatic stress disorder) 12/45/8099   Umbilical hernia without obstruction and without gangrene 06/17/2019   Encounter for surveillance of abnormal nevi 06/17/2019   Pineal gland cyst 06/17/2019   Chronic hip pain, left 06/02/2019   Lumbar spondylosis 06/02/2019   Mouth dryness 06/02/2019   Obesity (BMI 35.0-39.9 without comorbidity) 06/02/2019   Goals of care, counseling/discussion 05/18/2019   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[place]) 05/18/2019   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[place]) 05/18/2019   Occipital neuralgia 05/13/2019   Plantar fasciitis of left foot 04/27/2019   Fibromyalgia 03/23/2019   Systemic lupus erythematosus (SLE) in adult [facility]) 03/23/2019   Essential hypertension 03/23/2019   Hepatitis 03/23/2019   Mass of left kidney 03/23/2019   Diabetes mellitus ([place]) 03/23/2019   Nausea without vomiting 03/23/2019   Neuropathy 03/23/2019   Cancer ([place]) 03/23/2019   Hyperlipidemia 03/23/2019   Osteoarthritis 03/23/2019   Trigeminal neuralgia 03/23/2019   Chronic renal disease, stage III ([place]) 03/23/2019   Nephrolithiasis 03/23/2019   Migraine 03/23/2019   OSA on CPAP 03/23/2019   Fibrocystic breast 03/23/2019   Heart murmur 03/23/2019   Bipolar disorder, in partial remission, most recent episode depressed ([place]) 07/02/2017   Abnormal uterine bleeding 03/18/2017   Major depressive disorder, recurrent ([place]) 03/18/2017   Morbid obesity ([place]) 03/18/2017   Secondary hyperparathyroidism ([place]) 02/07/2017   Neuropathic pain 11/19/2016   Post herpetic neuralgia 11/19/2016   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[place])  01/13/2015   Lumbar disc disease with radiculopathy 07/26/2013   S/P liver transplant ([place]) 07/26/2013   Type 2 diabetes mellitus, uncontrolled 07/26/2013   Facial nerve disorder 06/29/2012   Low back pain 12/26/2010   Conditions to be addressed/monitored per PCP order:  Chronic healthcare management needs, OSD, migraines, HTN, DM, chronic pain, anxiety, depression, bipolar, PTSD, fibromyalgia, SLE, LDD, HLD, osteoarthritis, LBP, DLBCL, liver transplant,  Care Plan : Chronic Pain (Adult)  Updates made by [name], RN since 11/11/2022 12:00 AM     Problem: Chronic Pain Management-fibromyalgia   Priority: High  Onset Date: 01/22/2021     Long-Range Goal: Fibromyalgia pain managed-new pain management provider   Start Date: 08/14/2020  Expected End Date: 02/09/2023  Recent Progress: On track  Priority: High  Note:   Current Barriers:  Knowledge deficits related to pain management  09/10/22:  Pain controlled right now per patient-Dr. Humphrey Rolls recently added Marinol and has helped her pain and facial pain. 11/11/22:  patient with joint pain-is managed with moist heat  Nurse Case Manager Clinical Goal(s):  Over  the next 45 days, patient will work with [facility] to address needs related to referral for pain management provider and associated care coordination needs. 05/24/22:  patient is currently seeing Dr. Humphrey Rolls at [facility].  Interventions:  Inter-disciplinary care team collaboration (see longitudinal plan of care) Evaluation of current treatment plan related to fibromyalgia  and patient's adherence to plan as established by provider. Update 06/15/21:  Patient taking Baclofen again, Effexor added. Collaborated with primary care provider regarding recommendations and referral to pain management provider. Discussed plans with patient for ongoing care management follow up and provided patient with direct contact information for care management team Anticipate pain education  program, pain management support as part of pain management referral.       Patient Goals/Self-Care Activities Over the next 45 days, patient will:  -Attends all scheduled provider appointments  develop a personal pain management plan with your pain management provider.  Follow Up Plan:  The Managed Medicaid care management team will reach out to the patient again over the next 30  business days.    Care Plan : Wellness (Adult)  Updates made by [name], RN since 11/11/2022 12:00 AM     Problem: Medication Adherence (Wellness)   Priority: High  Onset Date: 01/22/2021     Long-Range Goal: Medication Adherence Maintained   Start Date: 09/06/2020  Expected End Date: 02/09/2023  Recent Progress: On track  Priority: High  Note:   Current Barriers:  Chronic Disease Management of chronic health conditions, OSD, migraines, DM, HTN, chronic pain, anxiety/depression/ Bipolar/PTSD, SLE, LDD, HLD, fibromyalgia, osteoarthritis, LBP, DLBCL, liver transplant, lymphoma 11/11/22:  patient with joint pain today, managed with moist heat.  Blood sugars 180-200 per patient.  BP WNL.  Has follow up appts with PCP and ONC scheduled.  Followed by Psychiatry on regular basis.  Nurse Case Manager Clinical Goal(s):  Over the next 30 days, patient will work with CM team pharmacist to review current medications. Update 01/22/21:  Patient met with Pharmacist and continues to follow. Over the next 30 days, patient will meet with Nutritionist and check her blood sugars. Over the next 30 days, patient will attend all scheduled appointments.  Interventions:  Inter-disciplinary care team collaboration (see longitudinal plan of care) Evaluation of current treatment plan and patient's adherence to plan as established by provider. Advised patient to contact her PCP for any medication needs. Reviewed medications with patient. Collaborated with pharmacy regarding medications.  Discussed plans with patient for ongoing  care management follow up and provided patient with direct contact information for care management team Pharmacy referral for medication review. Patient given phone number for [facility] transportation if needed. Will notify PCP of need for testing strips and dietician referral. Collaborated with PCP for cardiology appointment. Collaborated with [name] for dental resources. [name] referral for dental resources  Diabetes Interventions:  (Status:  New goal.) Long Term Goal Assessed patient's understanding of A1c goal: <7% Reviewed medications with patient and discussed importance of medication adherence Counseled on importance of regular laboratory monitoring as prescribed Discussed plans with patient for ongoing care management follow up and provided patient with direct contact information for care management team Reviewed scheduled/upcoming provider appointments Advised patient, providing education and rationale, to check cbg as directed and record, calling provider for findings outside established parameters Review of patient status, including review of consultants reports, relevant laboratory and other test results, and medications completed Assessed social determinant of health barriers Lab Results  Component Value  Date   HGBA1C 6.4 03/06/2022  Hyperlipidemia Interventions:  (Status:  New goal.) Long Term Goal Medication review performed; medication list updated in electronic medical record.  Provider established cholesterol goals reviewed Reviewed importance of limiting foods high in cholesterol Assessed social determinant of health barriers   Hypertension Interventions:  (Status:  New goal.) Long Term Goal Last practice recorded BP readings:  BP Readings from Last 3 Encounters:  10/21/22 (!) 142/106  09/18/22 113/85  06/21/22 104/62   Most recent eGFR/CrCl:  Lab Results  Component Value Date   EGFR 29 (L) 05/27/2022    No components found for:  "CRCL"  Evaluation of current treatment plan related to hypertension self management and patient's adherence to plan as established by provider Reviewed medications with patient and discussed importance of compliance Discussed plans with patient for ongoing care management follow up and provided patient with direct contact information for care management team Advised patient, providing education and rationale, to monitor blood pressure daily and record, calling PCP for findings outside established parameters Reviewed scheduled/upcoming provider appointments including:  Provided education on prescribed diet Discussed complications of poorly controlled blood pressure such as heart disease, stroke, circulatory complications, vision complications, kidney impairment, sexual dysfunction Assessed social determinant of health barriers  Pain Interventions:  (Status:  New goal.) Long Term Goal Pain assessment performed Medications reviewed Reviewed provider established plan for pain management Discussed importance of adherence to all scheduled medical appointments Counseled on the importance of reporting any/all new or changed pain symptoms or management strategies to pain management provider Advised patient to report to care team affect of pain on daily activities Discussed use of relaxation techniques and/or diversional activities to assist with pain reduction (distraction, imagery, relaxation, massage, acupressure, TENS, heat, and cold application Reviewed with patient prescribed pharmacological and nonpharmacological pain relief strategies Assessed social determinant of health barriers   Patient Goals/Self-Care Activities Over the next 30 days, patient will:  -Patient will take medications as prescribed. Calls pharmacy for medication refills Calls provider office for new concerns or questions  Follow Up Plan: The Managed Medicaid care management team will reach out to the patient again over the  next 30 business days.  The patient has been provided with contact information for the Managed Medicaid care management team and has been advised to call with any health related questions or concerns.    Follow Up:  Patient agrees to Care Plan and Follow-up.  Plan: The Managed Medicaid care management team will reach out to the patient again over the next 30 business  days. and The  Patient has been provided with contact information for the Managed Medicaid care management team and has been advised to call with any health related questions or concerns.  Date/time of next scheduled RN care management/care coordination outreach:  12/11/22 ay 1230

## 2022-11-14 ENCOUNTER — Other Ambulatory Visit: Payer: Medicaid Other

## 2022-11-14 NOTE — Patient Outreach (Signed)
Medicaid Managed Care Social Work Note  11/14/2022 Name:  Alison [name] MRN:  [id number] DOB:  1976/08/20  Alison [name] is an 47 y.o. year old female who is a primary patient of Bo Merino, FNP.  The Medicaid Managed Care Coordination team was consulted for assistance with:  Community Resources   Alison [name] was given information about Medicaid Managed Care Coordination team services today. Alison [name] Patient agreed to services and verbal consent obtained.  Engaged with patient  for by telephone forfollow up visit in response to referral for case management and/or care coordination services.   Assessments/Interventions:  Review of past medical history, allergies, medications, health status, including review of consultants reports, laboratory and other test data, was performed as part of comprehensive evaluation and provision of chronic care management services.  SDOH: (Social Determinant of Health) assessments and interventions performed: SDOH Interventions    Flowsheet Row Patient Outreach Telephone from 11/11/2022 in [place] Patient Outreach Telephone from 10/15/2022 in [place] Patient Outreach Telephone from 09/10/2022 in [place] Patient Outreach Telephone from 08/07/2022 in [place] Patient Outreach Telephone from 07/04/2022 in [place] Patient Outreach from 05/24/2022 in [place] Coordination  SDOH Interventions        Food Insecurity Interventions -- -- -- Intervention Not Indicated -- --  Housing Interventions -- -- -- -- Intervention Not Indicated --  Transportation Interventions -- -- -- Intervention Not Indicated -- --  Utilities Interventions -- -- -- -- Intervention Not Indicated --  Alcohol Usage Interventions Intervention Not Indicated (Score <7) -- -- --  -- --  Financial Strain Interventions -- Intervention Not Indicated -- -- -- --  Physical Activity Interventions -- Other (Comments)  [patient does not feel like exercising right now] -- -- -- --  Stress Interventions Provide Counseling  [sees Psychiatrist on regular basis] -- -- -- -- Provide Counseling  [patient sees Psychiatrist]  Social Connections Interventions -- -- Intervention Not Indicated -- -- --     [name] completed a telephone outreach with patient. She stated she still did not receive the email. [name] and patient agreed to send resources via [place]. Patient states no other resources are needed at this time.   Advanced Directives Status:  Not addressed in this encounter.  Care Plan                 Allergies  Allergen Reactions   Penicillin G Itching, Rash, Anaphylaxis and Palpitations   Lamictal [Lamotrigine] Rash   Carbamazepine Other (See Comments)    Medication interaction-prograf    Hydrocodone-Acetaminophen Itching   Naproxen Itching   Penicillins     Medications Reviewed Today     Reviewed by Alison Medicus, RN (Registered Nurse) on 11/11/22 at 1411  Med List Status: <None>   Medication Order Taking? Sig Documenting Provider Last Dose Status Informant  baclofen (LIORESAL) 10 MG tablet [id number] No Take 10 mg by mouth 2 (two) times daily. [provider] Taking Active Self  benztropine (COGENTIN) 1 MG tablet [id number] No Take 1 tablet (1 mg total) by mouth daily as needed for tremors. [name], MD Taking Active   Cholecalciferol (VITAMIN D) 125 MCG (5000 UT) CAPS [id number] No Take 5,000 Units by mouth daily. [provider] Taking Active Self  clindamycin (CLEOCIN) 300 MG capsule [id number] No Take 300 mg by mouth every 6 (six) hours. [provider] Taking Active   CRANBERRY PO [id number] No Take 1 capsule by mouth in the morning and at bedtime. [provider] Taking Active Self  dronabinol (MARINOL) 2.5 MG capsule [id number]  No Take 2.5 mg by mouth See admin instructions. Takes every other day [provider] Taking Active Self  Dulaglutide 4.5 MG/0.5ML SOPN [id number] No Inject 4.5 mg into the skin once a week. [provider] Taking Active   ezetimibe (ZETIA) 10 MG tablet [id number] No Take 10 mg by mouth every morning. [provider] Taking Active Self  fluticasone (FLONASE) 50 MCG/ACT nasal spray [id number] No Place into the nose. [provider] Taking Active   icosapent Ethyl (VASCEPA) 1 g capsule [id number] No Take 2 capsules (2 g total) by mouth 2 (two) times daily. [name], MD Taking Active Self           Med Note (HARPER, [name]   Tue Mar 19, 2022 10:24 AM) Taking 1 capsule twice daily per Dr. Ronnald Collum  lisinopril (ZESTRIL) 20 MG tablet [id number] No TAKE ONE TABLET BY MOUTH ONCE DAILY Bo Merino, FNP Taking Active   lurasidone (LATUDA) 20 MG TABS tablet [id number] No Take 1 tablet (20 mg total) by mouth daily with supper. [name], MD Taking Active   Magnesium 500 MG CAPS [id number] No Take 500 mg by mouth daily. [provider] Taking Active Self  Melatonin 10 MG TABS [id number] No Take 10 mg by mouth at bedtime. [provider] Taking Active Self  meloxicam (MOBIC) 15 MG tablet [id number]  Take 1 tablet (15 mg total) by mouth daily. Darletta Moll, PA-C  Active   metFORMIN (GLUCOPHAGE) 1000 MG tablet [id number] No TAKE ONE TABLET BY MOUTH TWICE DAILY with meals Bo Merino, FNP Taking Active   methocarbamol (ROBAXIN) 500 MG tablet [id number]  Take 1 tablet (500 mg total) by mouth 4 (four) times daily. Cuthriell, Charline Bills, PA-C  Active   mirtazapine (REMERON) 15 MG tablet [id number]  TAKE ONE TABLET BY MOUTH EVERYDAY AT BEDTIME [name], MD  Active   MYRBETRIQ 50 MG TB24 tablet [id number] No TAKE ONE TABLET BY MOUTH ONCE DAILY Vaillancourt, Samantha, PA-C Taking Active   naloxone (NARCAN) nasal spray 4 mg/0.1 mL  [id number] No Place 0.4 mg into the nose once. [provider] Taking Active Self  pregabalin (LYRICA) 200 MG capsule [id number] No Take 1 capsule (200 mg total) by mouth 2 (two) times daily. [name], MD Taking Active   rosuvastatin (CRESTOR) 10 MG tablet [id number] No Take 1 tablet (10 mg total) by mouth daily. [name], MD Taking Active   tacrolimus (PROGRAF) 1 MG capsule [id number] No Take by mouth. [provider] Taking Active   UBRELVY 50 MG TABS [id number] No Take 1 tablet by mouth daily as needed. [provider] Taking Active   venlafaxine XR (EFFEXOR XR) 37.5 MG 24 hr capsule [id number] No Take 1 capsule (37.5 mg total) by mouth daily with breakfast. Take along with 75 mg daily [name], [name], MD Taking Active   venlafaxine XR (EFFEXOR XR) 75 MG 24 hr capsule [id number] No Take 1 capsule (75 mg total) by mouth daily with breakfast. [name], MD Taking Active   XTAMPZA ER 9 MG C12A [id number]  Take 1 capsule by mouth every 12 (twelve) hours. [provider]  Active   XYLIMELTS 550 MG DISK [id number] No Take 550 mg by mouth every 8 (eight) hours as needed (dry mouth). [provider]  Taking Active Self            Patient Active Problem List   Diagnosis Date Noted   Bipolar 1 disorder, depressed, moderate ([place]) 07/08/2022   Sjogren's syndrome ([place]) 05/27/2022   Encounter for screening colonoscopy    Menorrhagia with irregular cycle    Akathisia 10/24/2021   At risk for prolonged QT interval syndrome 09/12/2021   Prolonged Q-T interval on ECG 08/29/2021   Insomnia 07/20/2021   Bipolar disorder, in full remission, most recent episode mixed ([place]) 07/20/2021   Bipolar 1 disorder, mixed, mild ([place]) 05/09/2021   GAD (generalized anxiety disorder) 05/09/2021   Hepatic cirrhosis ([place])    Opioid dependence with opioid-induced disorder ([place]) 12/20/2020   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    Bipolar 1 disorder, depressed, full remission ([place]) 09/27/2020   Suprapubic pain 07/25/2020   Bipolar 1 disorder, depressed, mild ([place]) 07/10/2020   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   Edema of lower extremity 09/16/2019   Carpal tunnel syndrome, left 07/26/2019   Cubital tunnel syndrome on left 07/26/2019   Bipolar I disorder, most recent episode depressed ([place]) 07/12/2019   PTSD (post-traumatic stress disorder) 09/32/3557   Umbilical hernia without obstruction and without gangrene 06/17/2019   Encounter for surveillance of abnormal nevi 06/17/2019   Pineal gland cyst 06/17/2019   Chronic hip pain, left 06/02/2019   Lumbar spondylosis 06/02/2019   Mouth dryness 06/02/2019   Obesity (BMI 35.0-39.9 without comorbidity) 06/02/2019   Goals of care, counseling/discussion 05/18/2019   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[place]) 05/18/2019   Occipital neuralgia 05/13/2019   Plantar fasciitis of left foot 04/27/2019   Fibromyalgia 03/23/2019   Systemic lupus erythematosus (SLE) in adult [facility]) 03/23/2019   Essential hypertension 03/23/2019   Hepatitis 03/23/2019   Mass of left kidney 03/23/2019   Diabetes mellitus ([place]) 03/23/2019   Nausea without vomiting 03/23/2019   Neuropathy 03/23/2019   Cancer ([place]) 03/23/2019   Hyperlipidemia 03/23/2019   Osteoarthritis 03/23/2019   Trigeminal neuralgia 03/23/2019   Chronic renal disease, stage III ([place]) 03/23/2019   Nephrolithiasis 03/23/2019   Migraine 03/23/2019   OSA on CPAP 03/23/2019   Fibrocystic breast 03/23/2019   Heart murmur 03/23/2019   Bipolar disorder, in partial remission, most recent episode depressed ([place]) 07/02/2017   Abnormal uterine bleeding 03/18/2017   Major depressive disorder, recurrent ([place]) 03/18/2017   Morbid obesity ([place]) 03/18/2017   Secondary hyperparathyroidism ([place]) 02/07/2017   Neuropathic pain  11/19/2016   Post herpetic neuralgia 11/19/2016   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   Follow up:  Patient agrees to Care Plan and Follow-up.  Plan: The Managed Medicaid care management team will reach out to the patient again over the next 45-60 days.  Date/time of next scheduled Social Work care management/care coordination outreach:  01/20/23  Mickel Fuchs, Arita Miss, [place] Medicaid Team  [phone]

## 2022-11-14 NOTE — Patient Instructions (Signed)
Visit Information  [name] was given information about Medicaid Managed Care team care coordination services as a part of their Healthy [facility] Medicaid benefit. [name] verbally consented  to engagement with the [facility] Managed Care team.   If you are experiencing a medical emergency, please call 911 or report to your local emergency department or urgent care.   If you have a non-emergency medical problem during routine business hours, please contact your provider's office and ask to speak with a nurse.   For questions related to your Healthy [facility] health plan, please call: [phone] or visit the homepage here: [email or web address]  If you would like to schedule transportation through your Healthy [facility] plan, please call the following number at least 2 days in advance of your appointment: [phone]  For information about your ride after you set it up, call Ride Assist at [phone]. Use this number to activate a Will Call pickup, or if your transportation is late for a scheduled pickup. Use this number, too, if you need to make a change or cancel a previously scheduled reservation.  If you need transportation services right away, call [phone]. The after-hours call center is staffed 24 hours to handle ride assistance and urgent reservation requests (including discharges) 365 days a year. Urgent trips include sick visits, hospital discharge requests and life-sustaining treatment.  Call the [facility] at [phone], at any time, 24 hours a day, 7 days a week. If you are in danger or need immediate medical attention call 911.  If you would like help to quit smoking, call 1-800-QUIT-NOW 228-536-3676) OR Espaol: 1-855-Djelo-Ya (0-600-459-9774) o para ms informacin haga clic aqu or Text READY to 200-400 to register via text  [name] - following are the goals we discussed in your visit today:    Goals Addressed   None       Social Worker will follow up in 30-60 days.   Mickel Fuchs, [name], [place] Managed Medicaid Team  [phone]   Following is a copy of your plan of care:  There are no care plans that you recently modified to display for this patient.

## 2022-11-15 DIAGNOSIS — F411 Generalized anxiety disorder: Secondary | ICD-10-CM | POA: Diagnosis not present

## 2022-11-15 DIAGNOSIS — F4310 Post-traumatic stress disorder, unspecified: Secondary | ICD-10-CM | POA: Diagnosis not present

## 2022-11-15 DIAGNOSIS — F3181 Bipolar II disorder: Secondary | ICD-10-CM | POA: Diagnosis not present

## 2022-11-27 ENCOUNTER — Ambulatory Visit: Payer: Medicaid Other | Admitting: Nurse Practitioner

## 2022-12-03 ENCOUNTER — Encounter: Payer: Self-pay | Admitting: Oncology

## 2022-12-03 ENCOUNTER — Inpatient Hospital Stay: Payer: Medicaid Other

## 2022-12-03 ENCOUNTER — Inpatient Hospital Stay: Payer: Medicaid Other | Attending: Oncology | Admitting: Oncology

## 2022-12-03 VITALS — BP 130/81 | HR 72 | Temp 96.2°F | Resp 20 | Ht 66.0 in | Wt 255.5 lb

## 2022-12-03 DIAGNOSIS — I129 Hypertensive chronic kidney disease with stage 1 through stage 4 chronic kidney disease, or unspecified chronic kidney disease: Secondary | ICD-10-CM | POA: Diagnosis not present

## 2022-12-03 DIAGNOSIS — N183 Chronic kidney disease, stage 3 unspecified: Secondary | ICD-10-CM | POA: Diagnosis not present

## 2022-12-03 DIAGNOSIS — C8580 Other specified types of non-Hodgkin lymphoma, unspecified site: Secondary | ICD-10-CM

## 2022-12-03 DIAGNOSIS — C884 Extranodal marginal zone B-cell lymphoma of mucosa-associated lymphoid tissue [MALT-lymphoma]: Secondary | ICD-10-CM | POA: Insufficient documentation

## 2022-12-03 DIAGNOSIS — E1122 Type 2 diabetes mellitus with diabetic chronic kidney disease: Secondary | ICD-10-CM | POA: Diagnosis not present

## 2022-12-03 DIAGNOSIS — Z95828 Presence of other vascular implants and grafts: Secondary | ICD-10-CM

## 2022-12-03 DIAGNOSIS — D696 Thrombocytopenia, unspecified: Secondary | ICD-10-CM

## 2022-12-03 DIAGNOSIS — Z08 Encounter for follow-up examination after completed treatment for malignant neoplasm: Secondary | ICD-10-CM

## 2022-12-03 LAB — CBC WITH DIFFERENTIAL/PLATELET
Abs Immature Granulocytes: 0.02 10*3/uL (ref 0.00–0.07)
Basophils Absolute: 0 10*3/uL (ref 0.0–0.1)
Basophils Relative: 1 %
Eosinophils Absolute: 0.5 10*3/uL (ref 0.0–0.5)
Eosinophils Relative: 8 %
HCT: 38.9 % (ref 36.0–46.0)
Hemoglobin: 12.8 g/dL (ref 12.0–15.0)
Immature Granulocytes: 0 %
Lymphocytes Relative: 39 %
Lymphs Abs: 2.5 10*3/uL (ref 0.7–4.0)
MCH: 28.4 pg (ref 26.0–34.0)
MCHC: 32.9 g/dL (ref 30.0–36.0)
MCV: 86.4 fL (ref 80.0–100.0)
Monocytes Absolute: 0.5 10*3/uL (ref 0.1–1.0)
Monocytes Relative: 8 %
Neutro Abs: 2.7 10*3/uL (ref 1.7–7.7)
Neutrophils Relative %: 44 %
Platelets: 114 10*3/uL — ABNORMAL LOW (ref 150–400)
RBC: 4.5 MIL/uL (ref 3.87–5.11)
RDW: 13.2 % (ref 11.5–15.5)
WBC: 6.3 10*3/uL (ref 4.0–10.5)
nRBC: 0 % (ref 0.0–0.2)

## 2022-12-03 LAB — LACTATE DEHYDROGENASE: LDH: 131 U/L (ref 98–192)

## 2022-12-03 LAB — COMPREHENSIVE METABOLIC PANEL
ALT: 21 U/L (ref 0–44)
AST: 29 U/L (ref 15–41)
Albumin: 3.7 g/dL (ref 3.5–5.0)
Alkaline Phosphatase: 64 U/L (ref 38–126)
Anion gap: 7 (ref 5–15)
BUN: 21 mg/dL — ABNORMAL HIGH (ref 6–20)
CO2: 25 mmol/L (ref 22–32)
Calcium: 8.7 mg/dL — ABNORMAL LOW (ref 8.9–10.3)
Chloride: 101 mmol/L (ref 98–111)
Creatinine, Ser: 0.91 mg/dL (ref 0.44–1.00)
GFR, Estimated: >60 mL/min (ref >60)
Glucose, Bld: 250 mg/dL — ABNORMAL HIGH (ref 70–99)
Potassium: 4.1 mmol/L (ref 3.5–5.1)
Sodium: 133 mmol/L — ABNORMAL LOW (ref 135–145)
Total Bilirubin: 0.8 mg/dL (ref 0.3–1.2)
Total Protein: 7.2 g/dL (ref 6.5–8.1)

## 2022-12-03 MED ORDER — SODIUM CHLORIDE 0.9% FLUSH
10.0000 mL | Freq: Once | INTRAVENOUS | Status: AC
  Administered 2022-12-03: 10 mL via INTRAVENOUS
  Filled 2022-12-03: qty 10

## 2022-12-03 MED ORDER — HEPARIN SOD (PORK) LOCK FLUSH 100 UNIT/ML IV SOLN
500.0000 [IU] | Freq: Once | INTRAVENOUS | Status: AC
  Administered 2022-12-03: 500 [IU] via INTRAVENOUS
  Filled 2022-12-03: qty 5

## 2022-12-03 NOTE — Progress Notes (Signed)
Patient states that she has been having chest pains when she take a deep breath, also pain on both sides of her neck.

## 2022-12-03 NOTE — Progress Notes (Signed)
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  She is currently on CellCept and tacrolimus for the same.  She also has a history of post transplant lymphoproliferative disorder/DLBCL that was diagnosed in 2016.  She is s/p 6 cycles of R-CHOP chemotherapy back then and was in complete remission 1.  Her other past medical history significant for migraines, stage III chronic kidney disease, chemo-induced peripheral neuropathy, hypertension among other medical problems.  With regards to her diffuse large B-cell lymphoma she was getting surveillance scans and this was all done in [place].  She has now moved to [facility] to be with her significant other.  Patient was noted to have a prior kidney mass before which was biopsied and was not consistent with malignancy.   She underwent CT neck with contrast before she left [place] on 02/22/2019.  She was noted to have soft tissue masses in both the parotid glands which were new as compared to prior exams and possibly represent lymph nodes.  The largest one was seen in  the left parotid gland measuring 1.3 x 1.1 cm.  Before she could get a work-up for this patient moved to [place] and has not had any further work-up yet   Her other prior imaging is as follows: MRI thoracic spine without contrast in August 2019 showed moderate multilevel spondylosis but no evidence of lymphoma.  Multinodular thyroid in June 2019.  MRI cervical spine May 2019 again showed spondylosis but no other acute pathology.  MRI brain showed incidental partial opacification of the right and right sinus.  No evidence of malignancy.  I do not have any other PET CT scan or treatment records from the past.   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  Bone marrow biopsy was negative for lymphoma.  Patient completed 4 cycles of weekly RituxanIn September 2020.  Repeat PET CT scan showed interval resolution of the parotid lesions compatible with complete response to therapy    Interval history-patient reports occasional pain along her right jawline.  Appetite and weight have remained stable.  She has been experiencing some pleuritic chest pain for the last 2 weeks on and off but feels that overall her symptoms are getting better  ECOG PS- 1 Pain scale- 0   Review of systems- Review of Systems  Constitutional:  Negative for chills, fever, malaise/fatigue and weight loss.  HENT:  Negative for congestion, ear discharge and nosebleeds.   Eyes:  Negative for blurred vision.  Respiratory:  Negative for cough, hemoptysis, sputum production, shortness of breath and wheezing.   Cardiovascular:  Positive for chest pain. Negative for palpitations, orthopnea and claudication.  Gastrointestinal:  Negative for abdominal pain,  blood in stool, constipation, diarrhea, heartburn, melena, nausea and vomiting.  Genitourinary:  Negative for dysuria, flank pain, frequency, hematuria and urgency.  Musculoskeletal:  Negative for back pain, joint pain and myalgias.  Skin:   Negative for rash.  Neurological:  Negative for dizziness, tingling, focal weakness, seizures, weakness and headaches.  Endo/Heme/Allergies:  Does not bruise/bleed easily.  Psychiatric/Behavioral:  Negative for depression and suicidal ideas. The patient does not have insomnia.       Allergies  Allergen Reactions   Penicillin G Itching, Rash, Anaphylaxis and Palpitations   Lamictal [Lamotrigine] Rash   Carbamazepine Other (See Comments)    Medication interaction-prograf    Hydrocodone-Acetaminophen Itching   Naproxen Itching   Penicillins      Past Medical History:  Diagnosis Date   Abnormal uterine bleeding    Allergy    Anxiety    Arthritis    Bipolar disorder (manic depression) (HCC)    Chronic kidney failure    Chronic renal disease, stage III (HCC)    Diabetes mellitus without complication (HCC)    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[place]) 2015   Right axillary lymph node resected and chemo tx's.   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HCC)    Lupus (HCC)    Major depressive disorder    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[place]) 06/2019   Chemo tx's   Migraine    Morbid obesity (HCC)    Neuromuscular disorder (HCC)    neuropathy   Neuropathy    Personality disorder (HCC)    Pineal gland cyst    Post herpetic neuralgia    PTSD (post-traumatic stress disorder)    Renal disorder    S/P liver transplant ([place]) 1993   Sleep apnea    has not recieved cpap yet-other one was recalled     Past Surgical History:  Procedure Laterality Date   BONE MARROW BIOPSY  01/13/2015   BREAST BIOPSY  12/2014   BREAST BIOPSY  2011   BREAST SURGERY     CHOLECYSTECTOMY     COLONOSCOPY     COLONOSCOPY WITH PROPOFOL N/A 02/28/2022   Procedure: COLONOSCOPY WITH PROPOFOL;  Surgeon: [name],  MD;  Location: [facility] ENDOSCOPY;  Service: Endoscopy;  Laterality: N/A;   ESOPHAGOGASTRODUODENOSCOPY     ESOPHAGOGASTRODUODENOSCOPY (EGD) WITH PROPOFOL N/A 01/09/2021   Procedure: ESOPHAGOGASTRODUODENOSCOPY (EGD) WITH PROPOFOL;  Surgeon: [name], MD;  Location: [facility] ENDOSCOPY;  Service: Endoscopy;  Laterality: N/A;   HERNIA REPAIR     x4-all from liver transplant   HYSTEROSCOPY WITH D & C N/A 11/13/2021   Procedure: DILATATION AND CURETTAGE /HYSTEROSCOPY;  Surgeon: Homero Fellers, MD;  Location: [facility] ORS;  Service: Gynecology;  Laterality: N/A;   infusaport     LIVER TRANSPLANT  12/17/1991   LUMBAR PUNCTURE     PORT A CATH INJECTION ([place] [facility])     RENAL BIOPSY     tumor removal  2015   cancer right side    Social History   Socioeconomic History   Marital status: Single    Spouse name: Not on file   Number of children: 0   Years of education: 9   Highest education level: GED or equivalent  Occupational History  Occupation: disabled  Tobacco Use   Smoking status: Never   Smokeless tobacco: Never  Vaping Use   Vaping Use: Never used  Substance and Sexual Activity   Alcohol use: Not Currently   Drug use: Not Currently    Comment: pain managment last used in early april   Sexual activity: Not Currently  Other Topics Concern   Not on file  Social History Narrative   Not on file   Social Determinants of Health   Financial Resource Strain: Low Risk  (10/15/2022)   Overall Financial Resource Strain (CARDIA)    Difficulty of Paying Living Expenses: Not very hard  Food Insecurity: No Food Insecurity (08/07/2022)   Hunger Vital Sign    Worried About Running Out of Food in the Last Year: Never true    Ran Out of Food in the Last Year: Never true  Transportation Needs: No Transportation Needs (08/07/2022)   PRAPARE - Hydrologist (Medical): No    Lack of Transportation (Non-Medical): No  Physical Activity: Inactive (10/15/2022)   Exercise Vital  Sign    Days of Exercise per Week: 0 days    Minutes of Exercise per Session: 0 min  Stress: Stress Concern Present (11/11/2022)   [place]    Feeling of Stress : To some extent  Social Connections: Moderately Integrated (09/10/2022)   Social Connection and Isolation Panel [NHANES]    Frequency of Communication with Friends and Family: More than three times a week    Frequency of Social Gatherings with Friends and Family: More than three times a week    Attends Religious Services: More than 4 times per year    Active Member of [employer] or Organizations: No    Attends Archivist Meetings: Never    Marital Status: Living with partner  Intimate Partner Violence: Not At Risk (09/10/2022)   Humiliation, Afraid, Rape, and Kick questionnaire    Fear of Current or Ex-Partner: No    Emotionally Abused: No    Physically Abused: No    Sexually Abused: No    Family History  Problem Relation Age of Onset   Heart disease Mother    Hypertension Mother    Cancer - Other Mother    Bipolar disorder Mother    Heart disease Father    Hypertension Father    Diabetes Father    Parkinson's disease Maternal Grandmother    Cancer Maternal Aunt    Cancer Maternal Uncle    Cancer Maternal Grandfather    Lupus Paternal Grandmother    Hypertension Brother      Current Outpatient Medications:    baclofen (LIORESAL) 10 MG tablet, Take 1 tablet by mouth 2 (two) times daily., Disp: , Rfl:    baclofen (LIORESAL) 20 MG tablet, Take by mouth., Disp: , Rfl:    benztropine (COGENTIN) 0.5 MG tablet, Take by mouth., Disp: , Rfl:    benztropine (COGENTIN) 1 MG tablet, Take 1 tablet (1 mg total) by mouth daily as needed for tremors., Disp: 90 tablet, Rfl: 0   Cholecalciferol (VITAMIN D) 125 MCG (5000 UT) CAPS, Take 5,000 Units by mouth daily., Disp: , Rfl:    Cranberry (RA CRANBERRY) 500 MG CAPS, Take by mouth., Disp: , Rfl:    CRANBERRY PO,  Take 1 capsule by mouth in the morning and at bedtime., Disp: , Rfl:    dronabinol (MARINOL) 2.5 MG capsule, Take 2.5 mg by mouth See  admin instructions. Takes every other day, Disp: , Rfl:    Dulaglutide (TRULICITY) A999333 0000000 SOPN, Inject into the skin., Disp: , Rfl:    Dulaglutide 4.5 MG/0.5ML SOPN, Inject 4.5 mg into the skin once a week., Disp: , Rfl:    DULoxetine (CYMBALTA) 60 MG capsule, Take by mouth., Disp: , Rfl:    ezetimibe (ZETIA) 10 MG tablet, Take 10 mg by mouth every morning., Disp: , Rfl:    ezetimibe (ZETIA) 10 MG tablet, Take 1 tablet by mouth daily., Disp: , Rfl:    hydrOXYzine (VISTARIL) 25 MG capsule, Take by mouth., Disp: , Rfl:    icosapent Ethyl (VASCEPA) 1 g capsule, Take 2 capsules (2 g total) by mouth 2 (two) times daily., Disp: 120 capsule, Rfl: 5   lisinopril (ZESTRIL) 20 MG tablet, TAKE ONE TABLET BY MOUTH ONCE DAILY, Disp: 90 tablet, Rfl: 3   lurasidone (LATUDA) 20 MG TABS tablet, Take 1 tablet (20 mg total) by mouth daily with supper., Disp: 90 tablet, Rfl: 0   Magnesium 500 MG CAPS, Take 500 mg by mouth daily., Disp: , Rfl:    Magnesium Bisglycinate (MAG GLYCINATE) 100 MG TABS, Take by mouth., Disp: , Rfl:    magnesium oxide (MAG-OX) 400 MG tablet, Take by mouth., Disp: , Rfl:    Melatonin 10 MG TABS, Take 10 mg by mouth at bedtime., Disp: , Rfl:    Melatonin 10 MG TABS, Take by mouth., Disp: , Rfl:    metFORMIN (GLUCOPHAGE) 1000 MG tablet, TAKE ONE TABLET BY MOUTH TWICE DAILY with meals, Disp: 180 tablet, Rfl: 2   methocarbamol (ROBAXIN) 500 MG tablet, Take 1 tablet (500 mg total) by mouth 4 (four) times daily., Disp: 16 tablet, Rfl: 0   mirtazapine (REMERON) 15 MG tablet, TAKE ONE TABLET BY MOUTH EVERYDAY AT BEDTIME, Disp: 90 tablet, Rfl: 0   MYRBETRIQ 50 MG TB24 tablet, TAKE ONE TABLET BY MOUTH ONCE DAILY, Disp: 90 tablet, Rfl: 3   naloxone (NARCAN) nasal spray 4 mg/0.1 mL, Place 0.4 mg into the nose once., Disp: , Rfl:    oxyCODONE (OXYCONTIN) 10 mg 12  hr tablet, Take by mouth., Disp: , Rfl:    PRECISION QID TEST test strip, , Disp: , Rfl:    pregabalin (LYRICA) 200 MG capsule, Take 1 capsule (200 mg total) by mouth 2 (two) times daily., Disp: 60 capsule, Rfl: 0   promethazine (PHENERGAN) 25 MG tablet, Take by mouth., Disp: , Rfl:    rosuvastatin (CRESTOR) 10 MG tablet, Take 1 tablet (10 mg total) by mouth daily., Disp: 90 tablet, Rfl: 3   tacrolimus (PROGRAF) 1 MG capsule, Take by mouth., Disp: , Rfl:    UBRELVY 50 MG TABS, Take 1 tablet by mouth daily as needed., Disp: , Rfl:    venlafaxine XR (EFFEXOR XR) 37.5 MG 24 hr capsule, Take 1 capsule (37.5 mg total) by mouth daily with breakfast. Take along with 75 mg daily, Disp: 90 capsule, Rfl: 0   venlafaxine XR (EFFEXOR XR) 75 MG 24 hr capsule, Take 1 capsule (75 mg total) by mouth daily with breakfast., Disp: 90 capsule, Rfl: 0   XTAMPZA ER 9 MG C12A, Take 1 capsule by mouth every 12 (twelve) hours., Disp: , Rfl:    XYLIMELTS 550 MG DISK, Take 550 mg by mouth every 8 (eight) hours as needed (dry mouth)., Disp: , Rfl:    clindamycin (CLEOCIN) 300 MG capsule, Take 300 mg by mouth every 6 (six) hours. (Patient not taking: Reported on  12/03/2022), Disp: , Rfl:    fluticasone (FLONASE) 50 MCG/ACT nasal spray, Place into the nose. (Patient not taking: Reported on 12/03/2022), Disp: , Rfl:    Lidocaine (GNP BURN RELIEF SPRAY) 0.5 % AERO, Apply topically. (Patient not taking: Reported on 12/03/2022), Disp: , Rfl:    meloxicam (MOBIC) 15 MG tablet, Take 1 tablet (15 mg total) by mouth daily. (Patient not taking: Reported on 12/03/2022), Disp: 30 tablet, Rfl: 0   QUEtiapine (SEROQUEL XR) 50 MG TB24 24 hr tablet, Take by mouth. (Patient not taking: Reported on 12/03/2022), Disp: , Rfl:    SUMAtriptan (IMITREX) 100 MG tablet, Take by mouth. (Patient not taking: Reported on 12/03/2022), Disp: , Rfl:   Physical exam:  Vitals:   12/03/22 1113  BP: 130/81  Pulse: 72  Resp: 20  Temp: (!) 96.2 F (35.7 C)   TempSrc: Tympanic  SpO2: 97%  Weight: 255 lb 8 oz (115.9 kg)  Height: '5\' 6"'$  (1.676 m)   Physical Exam Cardiovascular:     Rate and Rhythm: Normal rate and regular rhythm.     Heart sounds: Normal heart sounds.  Pulmonary:     Effort: Pulmonary effort is normal.     Breath sounds: Normal breath sounds.  Abdominal:     General: Bowel sounds are normal.     Palpations: Abdomen is soft.  Lymphadenopathy:     Comments: No palpable cervical, supraclavicular, axillary or inguinal adenopathy    Skin:    General: Skin is warm and dry.  Neurological:     Mental Status: She is alert and oriented to person, place, and time.         Latest Ref Rng & Units 12/03/2022   10:55 AM  CMP  Glucose 70 - 99 mg/dL 250   BUN 6 - 20 mg/dL 21   Creatinine 0.44 - 1.00 mg/dL 0.91   Sodium 135 - 145 mmol/L 133   Potassium 3.5 - 5.1 mmol/L 4.1   Chloride 98 - 111 mmol/L 101   CO2 22 - 32 mmol/L 25   Calcium 8.9 - 10.3 mg/dL 8.7   Total Protein 6.5 - 8.1 g/dL 7.2   Total Bilirubin 0.3 - 1.2 mg/dL 0.8   Alkaline Phos 38 - 126 U/L 64   AST 15 - 41 U/L 29   ALT 0 - 44 U/L 21       Latest Ref Rng & Units 12/03/2022   10:55 AM  CBC  WBC 4.0 - 10.5 K/uL 6.3   Hemoglobin 12.0 - 15.0 g/dL 12.8   Hematocrit 36.0 - 46.0 % 38.9   Platelets 150 - 400 K/uL 114      Assessment and plan- Patient is a 47 y.o. 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  She is here for routine follow-up of marginal zone lymphoma  Clinically patient is doing well with no concerning signs and symptoms of recurrence based on today's exam.  No palpable splenomegaly or adenopathy.  She does not require any surveillance imaging at this time.  She has mild chronic thrombocytopenia  which has remained stable overall.  I will see her back in 6 months with labs   Visit Diagnosis 1. Marginal zone  lymphoma ([place])      Dr. Randa Evens, MD, MPH [facility] [id number] 12/03/2022 4:34 PM

## 2022-12-09 DIAGNOSIS — G2111 Neuroleptic induced parkinsonism: Secondary | ICD-10-CM | POA: Diagnosis not present

## 2022-12-09 DIAGNOSIS — Z79899 Other long term (current) drug therapy: Secondary | ICD-10-CM | POA: Diagnosis not present

## 2022-12-09 DIAGNOSIS — I10 Essential (primary) hypertension: Secondary | ICD-10-CM | POA: Diagnosis not present

## 2022-12-09 DIAGNOSIS — G43119 Migraine with aura, intractable, without status migrainosus: Secondary | ICD-10-CM | POA: Diagnosis not present

## 2022-12-09 DIAGNOSIS — T43505A Adverse effect of unspecified antipsychotics and neuroleptics, initial encounter: Secondary | ICD-10-CM | POA: Diagnosis not present

## 2022-12-09 DIAGNOSIS — M5414 Radiculopathy, thoracic region: Secondary | ICD-10-CM | POA: Diagnosis not present

## 2022-12-09 DIAGNOSIS — F3189 Other bipolar disorder: Secondary | ICD-10-CM | POA: Diagnosis not present

## 2022-12-09 DIAGNOSIS — M79606 Pain in leg, unspecified: Secondary | ICD-10-CM | POA: Diagnosis not present

## 2022-12-09 DIAGNOSIS — G500 Trigeminal neuralgia: Secondary | ICD-10-CM | POA: Diagnosis not present

## 2022-12-09 DIAGNOSIS — G893 Neoplasm related pain (acute) (chronic): Secondary | ICD-10-CM | POA: Diagnosis not present

## 2022-12-09 DIAGNOSIS — M25519 Pain in unspecified shoulder: Secondary | ICD-10-CM | POA: Diagnosis not present

## 2022-12-09 DIAGNOSIS — M5442 Lumbago with sciatica, left side: Secondary | ICD-10-CM | POA: Diagnosis not present

## 2022-12-09 DIAGNOSIS — M79669 Pain in unspecified lower leg: Secondary | ICD-10-CM | POA: Diagnosis not present

## 2022-12-09 DIAGNOSIS — M79603 Pain in arm, unspecified: Secondary | ICD-10-CM | POA: Diagnosis not present

## 2022-12-09 DIAGNOSIS — G894 Chronic pain syndrome: Secondary | ICD-10-CM | POA: Diagnosis not present

## 2022-12-09 DIAGNOSIS — Z79891 Long term (current) use of opiate analgesic: Secondary | ICD-10-CM | POA: Diagnosis not present

## 2022-12-09 DIAGNOSIS — M797 Fibromyalgia: Secondary | ICD-10-CM | POA: Diagnosis not present

## 2022-12-09 DIAGNOSIS — F319 Bipolar disorder, unspecified: Secondary | ICD-10-CM | POA: Diagnosis not present

## 2022-12-11 ENCOUNTER — Other Ambulatory Visit: Payer: Medicaid Other | Admitting: Obstetrics and Gynecology

## 2022-12-11 ENCOUNTER — Encounter: Payer: Self-pay | Admitting: Obstetrics and Gynecology

## 2022-12-11 DIAGNOSIS — F4310 Post-traumatic stress disorder, unspecified: Secondary | ICD-10-CM | POA: Diagnosis not present

## 2022-12-11 DIAGNOSIS — F3181 Bipolar II disorder: Secondary | ICD-10-CM | POA: Diagnosis not present

## 2022-12-11 DIAGNOSIS — F411 Generalized anxiety disorder: Secondary | ICD-10-CM | POA: Diagnosis not present

## 2022-12-11 NOTE — Patient Outreach (Signed)
Medicaid Managed Care   Nurse Care Manager Note  12/11/2022 Name:  Alison [name] MRN:  [id number] DOB:  1976/04/11  Alison [name] is an 47 y.o. year old female who is a primary patient of Bo Merino, FNP.  [facility] Managed Care Coordination team was consulted for assistance with:    Chronic healthcare management needs, OSD, migraines, tremor, HTN, DM, chronic pain, neuralgia, anxiety/depression/bipolar/PTSD,  fibromyalgia, SLE, LDD, HLD, osteoarthritis, LBP, DLBCL, liver transplant  [name] was given information about Medicaid Managed Care Coordination team services today. [name] Patient agreed to services and verbal consent obtained.  Engaged with patient by telephone for follow up visit in response to provider referral for case management and/or care coordination services.   Assessments/Interventions:  Review of past medical history, allergies, medications, health status, including review of consultants reports, laboratory and other test data, was performed as part of comprehensive evaluation and provision of chronic care management services.  SDOH (Social Determinants of Health) assessments and interventions performed: SDOH Interventions    Flowsheet Row Patient Outreach Telephone from 12/11/2022 in [place] Patient Outreach Telephone from 11/11/2022 in [place] Patient Outreach Telephone from 10/15/2022 in [place] Patient Outreach Telephone from 09/10/2022 in [place] Patient Outreach Telephone from 08/07/2022 in [place] Patient Outreach Telephone from 07/04/2022 in [place] Coordination  SDOH Interventions        Food Insecurity Interventions -- -- -- -- Intervention Not Indicated --  Housing Interventions Intervention Not Indicated -- -- -- -- Intervention Not  Indicated  Transportation Interventions -- -- -- -- Intervention Not Indicated --  Utilities Interventions Intervention Not Indicated -- -- -- -- Intervention Not Indicated  Alcohol Usage Interventions -- Intervention Not Indicated (Score <7) -- -- -- --  Financial Strain Interventions -- -- Intervention Not Indicated -- -- --  Physical Activity Interventions -- -- Other (Comments)  [patient does not feel like exercising right now] -- -- --  Stress Interventions -- Provide Counseling  [sees Psychiatrist on regular basis] -- -- -- --  Social Connections Interventions -- -- -- Intervention Not Indicated -- --     Care Plan  Allergies  Allergen Reactions   Penicillin G Itching, Rash, Anaphylaxis and Palpitations   Lamictal [Lamotrigine] Rash   Carbamazepine Other (See Comments)    Medication interaction-prograf    Hydrocodone-Acetaminophen Itching   Naproxen Itching   Penicillins    Medications Reviewed Today     Reviewed by [name], RN (Registered Nurse) on 12/11/22 at [place] List Status: <None>   Medication Order Taking? Sig Documenting Provider Last Dose Status Informant  baclofen (LIORESAL) 10 MG tablet [id number] No Take 1 tablet by mouth 2 (two) times daily. [provider] Taking Active   baclofen (LIORESAL) 20 MG tablet [id number] No Take by mouth. [provider] Taking Active   benztropine (COGENTIN) 0.5 MG tablet [id number] No Take by mouth. [provider] Taking Active   benztropine (COGENTIN) 1 MG tablet [id number] No Take 1 tablet (1 mg total) by mouth daily as needed for tremors. [name], MD Taking Active   Cholecalciferol (VITAMIN D) 125 MCG (5000 UT) CAPS [id number] No Take 5,000 Units by mouth daily. [provider] Taking Active Self  clindamycin (CLEOCIN) 300 MG capsule [id number] No Take 300 mg by mouth every 6 (six) hours.  Patient not taking: Reported on  12/03/2022   [provider] Not Taking Active    Cranberry (RA CRANBERRY) 500 MG CAPS [id number] No Take by mouth. [provider] Taking Active   CRANBERRY PO [id number] No Take 1 capsule by mouth in the morning and at bedtime. [provider] Taking Active Self  dronabinol (MARINOL) 2.5 MG capsule [id number] No Take 2.5 mg by mouth See admin instructions. Takes every other day [provider] Taking Active Self  Dulaglutide (TRULICITY) [id number] 0000000 SOPN [id number] No Inject into the skin. [provider] Taking Active   Dulaglutide 4.5 MG/0.5ML SOPN [id number] No Inject 4.5 mg into the skin once a week. [provider] Taking Active   DULoxetine (CYMBALTA) 60 MG capsule [id number] No Take by mouth. [provider] Taking Active   ezetimibe (ZETIA) 10 MG tablet [id number] No Take 10 mg by mouth every morning. [provider] Taking Active Self  ezetimibe (ZETIA) 10 MG tablet [id number] No Take 1 tablet by mouth daily. [provider] Taking Active   fluticasone (FLONASE) 50 MCG/ACT nasal spray [id number] No Place into the nose.  Patient not taking: Reported on 12/03/2022   [provider] Not Taking Active   hydrOXYzine (VISTARIL) 25 MG capsule [id number] No Take by mouth. [provider] Taking Active   icosapent Ethyl (VASCEPA) 1 g capsule [id number] No Take 2 capsules (2 g total) by mouth 2 (two) times daily. [name], MD Taking Active Self           Med Note Jodi Mourning, [name]   Tue Mar 19, 2022 10:24 AM) Taking 1 capsule twice daily per Dr. Ronnald Collum  Lidocaine (GNP BURN RELIEF SPRAY) 0.5 % AERO [id number] No Apply topically.  Patient not taking: Reported on 12/03/2022   [provider] Not Taking Active   lisinopril (ZESTRIL) 20 MG tablet [id number] No TAKE ONE TABLET BY MOUTH ONCE DAILY Bo Merino, FNP Taking Active   lurasidone (LATUDA) 20 MG TABS tablet [id number] No Take 1 tablet (20 mg total) by mouth daily with supper. [name], MD Taking Active   Magnesium 500 MG CAPS [id number] No Take 500 mg by mouth daily. [provider] Taking Active Self  Magnesium Bisglycinate (MAG GLYCINATE) 100 MG TABS [id number] No Take by mouth. [provider] Taking Active   magnesium oxide (MAG-OX) 400 MG tablet [id number] No Take by mouth. [provider] Taking Active   Melatonin 10 MG TABS [id number] No Take 10 mg by mouth at bedtime. [provider] Taking Active Self  Melatonin 10 MG TABS [id number] No Take by mouth. [provider] Taking Active   meloxicam (MOBIC) 15 MG tablet [id number] No Take 1 tablet (15 mg total) by mouth daily.  Patient not taking: Reported on 12/03/2022   Brynda Peon Not Taking Active   metFORMIN (GLUCOPHAGE) 1000 MG tablet [id number] No TAKE ONE TABLET BY MOUTH TWICE DAILY with meals Bo Merino, FNP Taking Active   methocarbamol (ROBAXIN) 500 MG tablet [id number] No Take 1 tablet (500 mg total) by mouth 4 (four) times daily. Cuthriell, Charline Bills, PA-C Taking Active   mirtazapine (REMERON) 15 MG tablet [id number] No TAKE ONE TABLET BY MOUTH EVERYDAY AT BEDTIME [name], MD Taking Active   MYRBETRIQ 50 MG TB24 tablet [id number] No TAKE ONE TABLET BY MOUTH ONCE DAILY Vaillancourt, Samantha, PA-C Taking Active   naloxone (NARCAN) nasal spray 4 mg/0.1 mL [id number] No Place 0.4 mg into the nose once. [provider] Taking Active Self  oxyCODONE (OXYCONTIN) 10 mg 12 hr tablet [id number] No Take by mouth. [provider] Taking Active   PRECISION QID TEST test strip [id number] No  [provider] Taking Active   pregabalin (LYRICA) 200 MG capsule [id number] No Take 1 capsule (200 mg total) by mouth 2 (two) times daily. [name], MD Taking Active   promethazine (PHENERGAN) 25 MG tablet [id number] No Take by mouth. [provider] Taking Active   QUEtiapine (SEROQUEL XR) 50 MG TB24 24 hr tablet [id number] No  Take by mouth.  Patient not taking: Reported on 12/03/2022   [provider] Not Taking Active   rosuvastatin (CRESTOR) 10 MG tablet [id number] No Take 1 tablet (10 mg total) by mouth daily. [name], MD Taking Active   SUMAtriptan (IMITREX) 100 MG tablet [id number] No Take by mouth.  Patient not taking: Reported on 12/03/2022   [provider] Not Taking Active   tacrolimus (PROGRAF) 1 MG capsule [id number] No Take by mouth. [provider] Taking Active   UBRELVY 50 MG TABS [id number] No Take 1 tablet by mouth daily as needed. [provider] Taking Active   venlafaxine XR (EFFEXOR XR) 37.5 MG 24 hr capsule [id number] No Take 1 capsule (37.5 mg total) by mouth daily with breakfast. Take along with 75 mg daily [name], [name], MD Taking Active   venlafaxine XR (EFFEXOR XR) 75 MG 24 hr capsule [id number] No Take 1 capsule (75 mg total) by mouth daily with breakfast. [name], MD Taking Active   XTAMPZA ER 9 MG C12A [id number] No Take 1 capsule by mouth every 12 (twelve) hours. [provider] Taking Active   XYLIMELTS 550 MG DISK [id number] No Take 550 mg by mouth every 8 (eight) hours as needed (dry mouth). [provider] Taking Active Self           Patient Active Problem List   Diagnosis Date Noted   Bipolar 1 disorder, depressed, moderate ([place]) 07/08/2022   Sjogren's syndrome ([place]) 05/27/2022   Encounter for screening colonoscopy    Menorrhagia with irregular cycle    Akathisia 10/24/2021   At risk for prolonged QT interval syndrome 09/12/2021   Prolonged Q-T interval on ECG 08/29/2021   Insomnia 07/20/2021   Bipolar disorder, in full remission, most recent episode mixed ([place]) 07/20/2021   Bipolar 1 disorder, mixed, mild ([place]) 05/09/2021   GAD (generalized anxiety disorder) 05/09/2021   Hepatic cirrhosis ([place])    Opioid dependence with opioid-induced disorder ([place]) 12/20/2020   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[place]) 12/20/2020   Bipolar 1 disorder, depressed, full remission ([place]) 09/27/2020   Suprapubic pain 07/25/2020   Bipolar 1 disorder, depressed, mild ([place]) 07/10/2020   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   Edema of lower extremity 09/16/2019   Carpal tunnel syndrome, left 07/26/2019   Cubital tunnel syndrome on left 07/26/2019   Bipolar I disorder, most recent episode depressed ([place]) 07/12/2019   PTSD (post-traumatic stress disorder) [id number]   Umbilical hernia without obstruction and without gangrene 06/17/2019   Encounter for surveillance of abnormal nevi 06/17/2019   Pineal gland cyst 06/17/2019   Chronic hip pain, left 06/02/2019   Lumbar spondylosis 06/02/2019   Mouth dryness 06/02/2019   Obesity (BMI 35.0-39.9 without comorbidity) 06/02/2019   Goals of care, counseling/discussion 05/18/2019   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) 05/18/2019   Occipital neuralgia 05/13/2019   Plantar fasciitis of left foot 04/27/2019  Fibromyalgia 03/23/2019   Systemic lupus erythematosus (SLE) in adult [facility]) 03/23/2019   Essential hypertension 03/23/2019   Hepatitis 03/23/2019   Mass of left kidney 03/23/2019   Diabetes mellitus ([place]) 03/23/2019   Nausea without vomiting 03/23/2019   Neuropathy 03/23/2019   Cancer ([place]) 03/23/2019   Hyperlipidemia 03/23/2019   Osteoarthritis 03/23/2019   Trigeminal neuralgia 03/23/2019   Chronic renal disease, stage III ([place]) 03/23/2019   Nephrolithiasis 03/23/2019   Migraine 03/23/2019   OSA on CPAP 03/23/2019   Fibrocystic breast 03/23/2019   Heart murmur 03/23/2019   Bipolar disorder, in partial remission, most recent episode depressed ([place]) 07/02/2017   Abnormal uterine bleeding 03/18/2017   Major depressive disorder, recurrent ([place]) 03/18/2017   Morbid obesity ([place]) 03/18/2017   Secondary hyperparathyroidism  ([place]) 02/07/2017   Neuropathic pain 11/19/2016   Post herpetic neuralgia 11/19/2016   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[place]) 01/13/2015   Lumbar disc disease with radiculopathy 07/26/2013   S/P liver transplant ([place]) 07/26/2013   Type 2 diabetes mellitus, uncontrolled 07/26/2013   Facial nerve disorder 06/29/2012   Low back pain 12/26/2010   Conditions to be addressed/monitored per PCP order:  Chronic healthcare management needs, OSD, migraines, tremor, HTN, DM, chronic pain, neuralgia, anxiety/depression/bipolar/PTSD,   Care Plan : Chronic Pain (Adult)  Updates made by [name], RN since 12/11/2022 12:00 AM     Problem: Chronic Pain Management-fibromyalgia   Priority: High  Onset Date: 01/22/2021     Long-Range Goal: Fibromyalgia pain managed-new pain management provider   Start Date: 08/14/2020  Expected End Date: 02/09/2023  Recent Progress: On track  Priority: High  Note:   Current Barriers:  Knowledge deficits related to pain management  12/11/22:  No complaints of pain today.  Nurse Case Manager Clinical Goal(s):  Over the next 45 days, patient will work with [employer] to address needs related to referral for pain management provider and associated care coordination needs. 05/24/22:  patient is currently seeing Dr. Humphrey Rolls at [facility].  Interventions:  Inter-disciplinary care team collaboration (see longitudinal plan of care) Evaluation of current treatment plan related to fibromyalgia  and patient's adherence to plan as established by provider. Update 06/15/21:  Patient taking Baclofen again, Effexor added. Collaborated with primary care provider regarding recommendations and referral to pain management provider. Discussed plans with patient for ongoing care management follow up and provided patient with direct contact information for care management team Anticipate pain education program, pain management support as part of pain management referral.        Patient Goals/Self-Care Activities Over the next 45 days, patient will:  -Attends all scheduled provider appointments  develop a personal pain management plan with your pain management provider.  Follow Up Plan:  The Managed Medicaid care management team will reach out to the patient again over the next 30  business days.    Care Plan : Wellness (Adult)  Updates made by [name], RN since 12/11/2022 12:00 AM     Problem: Medication Adherence (Wellness)   Priority: High  Onset Date: 01/22/2021     Long-Range Goal: Medication Adherence Maintained   Start Date: 09/06/2020  Expected End Date: 02/09/2023  Recent Progress: On track  Priority: High  Note:   Current Barriers:  Chronic Disease Management of chronic health conditions, OSD, migraines, DM, HTN, chronic pain, anxiety/depression/ Bipolar/PTSD, SLE, LDD, HLD, fibromyalgia, osteoarthritis, LBP, DLBCL, liver transplant, lymphoma 12/11/22:  patient with no complaints today-recent visits with [facility] and [place]  [facility].  Patient states she received needed resources.  BP 130/81 at [place] visit 2/27.  Blood sugars 200s per patient.  Followed by Psychiatry.  Nurse Case Manager Clinical Goal(s):  Over the next 30 days, patient will work with CM team pharmacist to review current medications. Update 01/22/21:  Patient met with Pharmacist and continues to follow. Over the next 30 days, patient will meet with Nutritionist and check her blood sugars. Over the next 30 days, patient will attend all scheduled appointments.  Interventions:  Inter-disciplinary care team collaboration (see longitudinal plan of care) Evaluation of current treatment plan and patient's adherence to plan as established by provider. Advised patient to contact her PCP for any medication needs. Reviewed medications with patient. Collaborated with pharmacy regarding medications.  Discussed plans with patient for ongoing care management follow up and provided patient with direct  contact information for care management team Pharmacy referral for medication review. Patient given phone number for [facility] transportation if needed. Will notify PCP of need for testing strips and dietician referral. Collaborated with PCP for cardiology appointment. Collaborated with [name] for dental resources. [name] referral for dental resources-completed.  Diabetes Interventions:  (Status:  New goal.) Long Term Goal Assessed patient's understanding of A1c goal: <7% Reviewed medications with patient and discussed importance of medication adherence Counseled on importance of regular laboratory monitoring as prescribed Discussed plans with patient for ongoing care management follow up and provided patient with direct contact information for care management team Reviewed scheduled/upcoming provider appointments Advised patient, providing education and rationale, to check cbg as directed and record, calling provider for findings outside established parameters Review of patient status, including review of consultants reports, relevant laboratory and other test results, and medications completed Assessed social determinant of health barriers Lab Results  Component Value Date   HGBA1C 6.4 03/06/2022  Hyperlipidemia Interventions:  (Status:  New goal.) Long Term Goal Medication review performed; medication list updated in electronic medical record.  Provider established cholesterol goals reviewed Reviewed importance of limiting foods high in cholesterol Assessed social determinant of health barriers   Hypertension Interventions:  (Status:  New goal.) Long Term Goal Last practice recorded BP readings:  BP Readings from Last 3 Encounters:  10/21/22 (!) 142/106  09/18/22 113/85  06/21/22 104/62  12/03/22         130/81 Most recent eGFR/CrCl:  Lab Results  Component Value Date   EGFR 29 (L) 05/27/2022    No components found for: "CRCL"  Evaluation of current treatment  plan related to hypertension self management and patient's adherence to plan as established by provider Reviewed medications with patient and discussed importance of compliance Discussed plans with patient for ongoing care management follow up and provided patient with direct contact information for care management team Advised patient, providing education and rationale, to monitor blood pressure daily and record, calling PCP for findings outside established parameters Reviewed scheduled/upcoming provider appointments including:  Provided education on prescribed diet Discussed complications of poorly controlled blood pressure such as heart disease, stroke, circulatory complications, vision complications, kidney impairment, sexual dysfunction Assessed social determinant of health barriers  Pain Interventions:  (Status:  New goal.) Long Term Goal Pain assessment performed Medications reviewed Reviewed provider established plan for pain management Discussed importance of adherence to all scheduled medical appointments Counseled on the importance of reporting any/all new or changed pain symptoms or management strategies to pain management provider Advised patient to report to care team affect of pain on daily activities Discussed use of  relaxation techniques and/or diversional activities to assist with pain reduction (distraction, imagery, relaxation, massage, acupressure, TENS, heat, and cold application Reviewed with patient prescribed pharmacological and nonpharmacological pain relief strategies Assessed social determinant of health barriers   Patient Goals/Self-Care Activities Over the next 30 days, patient will:  -Patient will take medications as prescribed. Calls pharmacy for medication refills Calls provider office for new concerns or questions  Follow Up Plan: The Managed Medicaid care management team will reach out to the patient again over the next 30 business days.  The patient has  been provided with contact information for the Managed Medicaid care management team and has been advised to call with any health related questions or concerns.    Follow Up:  Patient agrees to Care Plan and Follow-up.  Plan: The Managed Medicaid care management team will reach out to the patient again over the next 30 business  days. and The  Patient has been provided with contact information for the Managed Medicaid care management team and has been advised to call with any health related questions or concerns.  Date/time of next scheduled RN care management/care coordination outreach:  01/10/23 at 1230.

## 2022-12-11 NOTE — Patient Instructions (Signed)
Hi Alison [name], glad you are doing well-have a nice afternoon!  Alison [name] was given information about Medicaid Managed Care team care coordination services as a part of their Healthy [facility] Medicaid benefit. Alison [name] verbally consented to engagement with the [facility] Managed Care team.   If you are experiencing a medical emergency, please call 911 or report to your local emergency department or urgent care.   If you have a non-emergency medical problem during routine business hours, please contact your provider's office and ask to speak with a nurse.   For questions related to your Healthy [facility] health plan, please call: [phone] or visit the homepage here: [email or web address]  If you would like to schedule transportation through your Healthy [facility] plan, please call the following number at least 2 days in advance of your appointment: [phone]  For information about your ride after you set it up, call Ride Assist at [phone]. Use this number to activate a Will Call pickup, or if your transportation is late for a scheduled pickup. Use this number, too, if you need to make a change or cancel a previously scheduled reservation.  If you need transportation services right away, call [phone]. The after-hours call center is staffed 24 hours to handle ride assistance and urgent reservation requests (including discharges) 365 days a year. Urgent trips include sick visits, hospital discharge requests and life-sustaining treatment.  Call the [facility] at [phone], at any time, 24 hours a day, 7 days a week. If you are in danger or need immediate medical attention call 911.  If you would like help to quit smoking, call 1-800-QUIT-NOW 940-616-0328) OR Espaol: 1-855-Djelo-Ya HD:1601594) o para ms informacin haga clic aqu or Text READY to 200-400 to register via text  Alison [name] - following are the  goals we discussed in your visit today:   Goals Addressed             This Visit's Progress    Chronic Pain Managed       12/11/22:  No complaints today    Get sugars at goal       Timeframe:  Short-Term Goal Priority:  High Start Date:                             Expected End Date:      ongoing                 Follow Up Date: 01/11/23    - call for medicine refill 2 or 3 days before it runs out - call if I am sick and can't take my medicine - keep a list of all the medicines I take; vitamins and herbals too    Why is this important?   These steps will help you keep on track with your medicines.  12/11/22:  Blood sugars 200s per patient.    Patient verbalizes understanding of instructions and care plan provided today and agrees to view in Alison [name]. Active MyChart status and patient understanding of how to access instructions and care plan via MyChart confirmed with patient.     The Managed Medicaid care management team will reach out to the patient again over the next 30 business days.  The  Patient has been provided with contact information for the Managed Medicaid care management team and has been advised to call with any health related questions or concerns.   [name], [name] [facility] Management Coordinator - Managed Medicaid High Risk [phone]   Following is a copy of your plan of care:  Care Plan : Chronic Pain (Adult)  Updates made by [name], RN since 12/11/2022 12:00 AM     Problem: Chronic Pain Management-fibromyalgia   Priority: High  Onset Date: 01/22/2021     Long-Range Goal: Fibromyalgia pain managed-new pain management provider   Start Date: 08/14/2020  Expected End Date: 02/09/2023  Recent Progress: On track  Priority: High  Note:   Current Barriers:  Knowledge deficits related to pain management  12/11/22:  No complaints of pain today.  Nurse Case Manager Clinical Goal(s):  Over the next 45 days, patient  will work with [facility] to address needs related to referral for pain management provider and associated care coordination needs. 05/24/22:  patient is currently seeing Dr. Humphrey [facility] at [facility].  Interventions:  Inter-disciplinary care team collaboration (see longitudinal plan of care) Evaluation of current treatment plan related to fibromyalgia  and patient's adherence to plan as established by provider. Update 06/15/21:  Patient taking Baclofen again, Effexor added. Collaborated with primary care provider regarding recommendations and referral to pain management provider. Discussed plans with patient for ongoing care management follow up and provided patient with direct contact information for care management team Anticipate pain education program, pain management support as part of pain management referral.       Patient Goals/Self-Care Activities Over the next 45 days, patient will:  -Attends all scheduled provider appointments  develop a personal pain management plan with your pain management provider.  Follow Up Plan:  The Managed Medicaid care management team will reach out to the patient again over the next 30  business days   Care Plan : Wellness (Adult)  Updates made by [name], RN since 12/11/2022 12:00 AM     Problem: Medication Adherence (Wellness)   Priority: High  Onset Date: 01/22/2021     Long-Range Goal: Medication Adherence Maintained   Start Date: 09/06/2020  Expected End Date: 02/09/2023  Recent Progress: On track  Priority: High  Note:   Current Barriers:  Chronic Disease Management of chronic health conditions, OSD, migraines, DM, HTN, chronic pain, anxiety/depression/ Bipolar/PTSD, SLE, LDD, HLD, fibromyalgia, osteoarthritis, LBP, DLBCL, liver transplant, lymphoma 12/11/22:  patient with no complaints today-recent visits with NEURO and ONC WNL.  Patient states she received needed resources.  BP 130/81 at Abie visit 2/27.  Blood sugars 200s per  patient.  Followed by Psychiatry.  Nurse Case Manager Clinical Goal(s):  Over the next 30 days, patient will work with CM team pharmacist to review current medications. Update 01/22/21:  Patient met with Pharmacist and continues to follow. Over the next 30 days, patient will meet with Nutritionist and check her blood sugars. Over the next 30 days, patient will attend all scheduled appointments.  Interventions:  Inter-disciplinary care team collaboration (see longitudinal plan of care) Evaluation of current treatment plan and patient's adherence to plan as established by provider. Advised patient to contact her PCP for any medication needs. Reviewed medications with patient. Collaborated with pharmacy regarding medications.  Discussed plans with patient for ongoing care management follow up and provided patient with direct contact information for care management team Pharmacy referral for medication review. Patient given phone number for Healthy [facility] transportation if needed. Will notify PCP of need for testing strips and dietician referral. Collaborated with PCP for cardiology appointment. Collaborated with [name] for  dental resources. [name] referral for dental resources-completed.  Diabetes Interventions:  (Status:  New goal.) Long Term Goal Assessed patient's understanding of A1c goal: <7% Reviewed medications with patient and discussed importance of medication adherence Counseled on importance of regular laboratory monitoring as prescribed Discussed plans with patient for ongoing care management follow up and provided patient with direct contact information for care management team Reviewed scheduled/upcoming provider appointments Advised patient, providing education and rationale, to check cbg as directed and record, calling provider for findings outside established parameters Review of patient status, including review of consultants reports, relevant laboratory and other  test results, and medications completed Assessed social determinant of health barriers Lab Results  Component Value Date   HGBA1C 6.4 03/06/2022  Hyperlipidemia Interventions:  (Status:  New goal.) Long Term Goal Medication review performed; medication list updated in electronic medical record.  Provider established cholesterol goals reviewed Reviewed importance of limiting foods high in cholesterol Assessed social determinant of health barriers   Hypertension Interventions:  (Status:  New goal.) Long Term Goal Last practice recorded BP readings:  BP Readings from Last 3 Encounters:  10/21/22 (!) 142/106  09/18/22 113/85  06/21/22 104/62  12/03/22         130/81 Most recent eGFR/CrCl:  Lab Results  Component Value Date   EGFR 29 (L) 05/27/2022    No components found for: "CRCL"  Evaluation of current treatment plan related to hypertension self management and patient's adherence to plan as established by provider Reviewed medications with patient and discussed importance of compliance Discussed plans with patient for ongoing care management follow up and provided patient with direct contact information for care management team Advised patient, providing education and rationale, to monitor blood pressure daily and record, calling PCP for findings outside established parameters Reviewed scheduled/upcoming provider appointments including:  Provided education on prescribed diet Discussed complications of poorly controlled blood pressure such as heart disease, stroke, circulatory complications, vision complications, kidney impairment, sexual dysfunction Assessed social determinant of health barriers  Pain Interventions:  (Status:  New goal.) Long Term Goal Pain assessment performed Medications reviewed Reviewed provider established plan for pain management Discussed importance of adherence to all scheduled medical appointments Counseled on the importance of reporting any/all new or  changed pain symptoms or management strategies to pain management provider Advised patient to report to care team affect of pain on daily activities Discussed use of relaxation techniques and/or diversional activities to assist with pain reduction (distraction, imagery, relaxation, massage, acupressure, TENS, heat, and cold application Reviewed with patient prescribed pharmacological and nonpharmacological pain relief strategies Assessed social determinant of health barriers   Patient Goals/Self-Care Activities Over the next 30 days, patient will:  -Patient will take medications as prescribed. Calls pharmacy for medication refills Calls provider office for new concerns or questions  Follow Up Plan: The Managed Medicaid care management team will reach out to the patient again over the next 30 business days.  The patient has been provided with contact information for the Managed Medicaid care management team and has been advised to call with any health related questions or concerns.

## 2022-12-12 ENCOUNTER — Other Ambulatory Visit
Admission: RE | Admit: 2022-12-12 | Discharge: 2022-12-12 | Disposition: A | Discharge status: Home / Self Care | Payer: Medicaid Other | Source: Ambulatory Visit | Attending: Psychiatry | Admitting: Psychiatry

## 2022-12-12 ENCOUNTER — Ambulatory Visit (INDEPENDENT_AMBULATORY_CARE_PROVIDER_SITE_OTHER): Payer: Medicaid Other | Admitting: Psychiatry

## 2022-12-12 ENCOUNTER — Encounter: Payer: Self-pay | Admitting: Psychiatry

## 2022-12-12 VITALS — BP 104/73 | HR 87 | Temp 97.3°F | Ht 66.0 in | Wt 255.4 lb

## 2022-12-12 DIAGNOSIS — F4310 Post-traumatic stress disorder, unspecified: Secondary | ICD-10-CM

## 2022-12-12 DIAGNOSIS — F3176 Bipolar disorder, in full remission, most recent episode depressed: Secondary | ICD-10-CM

## 2022-12-12 DIAGNOSIS — Z79899 Other long term (current) drug therapy: Secondary | ICD-10-CM | POA: Insufficient documentation

## 2022-12-12 DIAGNOSIS — F411 Generalized anxiety disorder: Secondary | ICD-10-CM | POA: Diagnosis not present

## 2022-12-12 DIAGNOSIS — C8580 Other specified types of non-Hodgkin lymphoma, unspecified site: Secondary | ICD-10-CM | POA: Insufficient documentation

## 2022-12-12 DIAGNOSIS — G4701 Insomnia due to medical condition: Secondary | ICD-10-CM

## 2022-12-12 LAB — CBC WITH DIFFERENTIAL/PLATELET
Abs Immature Granulocytes: 0.03 10*3/uL (ref 0.00–0.07)
Basophils Absolute: 0.1 10*3/uL (ref 0.0–0.1)
Basophils Relative: 1 %
Eosinophils Absolute: 0.6 10*3/uL — ABNORMAL HIGH (ref 0.0–0.5)
Eosinophils Relative: 8 %
HCT: 41.6 % (ref 36.0–46.0)
Hemoglobin: 13.7 g/dL (ref 12.0–15.0)
Immature Granulocytes: 0 %
Lymphocytes Relative: 40 %
Lymphs Abs: 3 10*3/uL (ref 0.7–4.0)
MCH: 28.6 pg (ref 26.0–34.0)
MCHC: 32.9 g/dL (ref 30.0–36.0)
MCV: 86.8 fL (ref 80.0–100.0)
Monocytes Absolute: 0.6 10*3/uL (ref 0.1–1.0)
Monocytes Relative: 8 %
Neutro Abs: 3.3 10*3/uL (ref 1.7–7.7)
Neutrophils Relative %: 43 %
Platelets: 146 10*3/uL — ABNORMAL LOW (ref 150–400)
RBC: 4.79 MIL/uL (ref 3.87–5.11)
RDW: 13.2 % (ref 11.5–15.5)
WBC: 7.5 10*3/uL (ref 4.0–10.5)
nRBC: 0 % (ref 0.0–0.2)

## 2022-12-12 LAB — COMPREHENSIVE METABOLIC PANEL
ALT: 32 U/L (ref 0–44)
AST: 48 U/L — ABNORMAL HIGH (ref 15–41)
Albumin: 4.1 g/dL (ref 3.5–5.0)
Alkaline Phosphatase: 72 U/L (ref 38–126)
Anion gap: 10 (ref 5–15)
BUN: 20 mg/dL (ref 6–20)
CO2: 25 mmol/L (ref 22–32)
Calcium: 9.4 mg/dL (ref 8.9–10.3)
Chloride: 98 mmol/L (ref 98–111)
Creatinine, Ser: 1.06 mg/dL — ABNORMAL HIGH (ref 0.44–1.00)
GFR, Estimated: >60 mL/min (ref >60)
Glucose, Bld: 318 mg/dL — ABNORMAL HIGH (ref 70–99)
Potassium: 4.5 mmol/L (ref 3.5–5.1)
Sodium: 133 mmol/L — ABNORMAL LOW (ref 135–145)
Total Bilirubin: 0.8 mg/dL (ref 0.3–1.2)
Total Protein: 7.5 g/dL (ref 6.5–8.1)

## 2022-12-12 LAB — LACTATE DEHYDROGENASE: LDH: 152 U/L (ref 98–192)

## 2022-12-12 MED ORDER — VENLAFAXINE HCL ER 75 MG PO CP24: 75.0000 mg | ORAL_CAPSULE | Freq: Every day | ORAL | 0 refills | Status: DC

## 2022-12-12 MED ORDER — LURASIDONE HCL 20 MG PO TABS: 20.0000 mg | ORAL_TABLET | Freq: Every day | ORAL | 0 refills | Status: DC

## 2022-12-12 MED ORDER — BENZTROPINE MESYLATE 1 MG PO TABS: 1.0000 mg | ORAL_TABLET | Freq: Every day | ORAL | 0 refills | Status: DC | PRN

## 2022-12-12 MED ORDER — VENLAFAXINE HCL ER 37.5 MG PO CP24: 37.5000 mg | ORAL_CAPSULE | Freq: Every day | ORAL | 0 refills | Status: DC

## 2022-12-12 NOTE — Progress Notes (Signed)
[facility] MD OP Progress Note  12/12/2022 5:09 PM Alison [name]  MRN:  [id number]  Chief Complaint:  Chief Complaint  Patient presents with   Follow-up   Depression   Anxiety   HPI: Alison [name] is a 47 year old Caucasian female, lives in [place], has a history of bipolar disorder, PTSD, medical problems including fibromyalgia, chronic pain, trigeminal neuralgia, history of liver transplant, B-cell lymphoma, SLE, diabetes mellitus, stage IV chronic kidney disease, OSA, chronic migraine headaches, hyperlipidemia, hypertension was evaluated in office today.  Patient today reports she is currently doing fairly well.  She was able to spend a day playing dungeons and dragons with her friend recently.  She reports this was on his birthday and it was like a gift for him.  She reports she really enjoyed it.  She continues to do a lot of thrift shopping, craft work.  She really enjoys this.  She reports sleep is overall okay.  She does wake up between 3 and 5 AM however most of the time she is able to fall back asleep.  She gets around 10 to 12 hours of sleep on a regular basis.  Patient is currently compliant on her medications including venlafaxine, mirtazapine, Latuda.  Does report muscle spasms however reports she has them when she does not take her magnesium and does not believe it is coming from medications at this time.  Patient appeared to be alert, oriented to person place time situation.  3 word memory immediate 3 out of 3, after 5 minutes 3 out of 3.  Patient was able to do serial sevens.  Patient denies any suicidality, homicidality or perceptual disturbances.  Patient denies any other concerns today.  Visit Diagnosis:    ICD-10-CM   1. Bipolar 1 disorder, depressed, full remission (HCC)  F31.76 Prolactin    benztropine (COGENTIN) 1 MG tablet    lurasidone (LATUDA) 20 MG TABS tablet    2. PTSD (post-traumatic stress disorder)  F43.10 benztropine (COGENTIN) 1 MG tablet     lurasidone (LATUDA) 20 MG TABS tablet    venlafaxine XR (EFFEXOR XR) 37.5 MG 24 hr capsule    venlafaxine XR (EFFEXOR XR) 75 MG 24 hr capsule    3. GAD (generalized anxiety disorder)  F41.1 benztropine (COGENTIN) 1 MG tablet    lurasidone (LATUDA) 20 MG TABS tablet    venlafaxine XR (EFFEXOR XR) 37.5 MG 24 hr capsule    venlafaxine XR (EFFEXOR XR) 75 MG 24 hr capsule    4. Insomnia due to medical condition  G47.01    mood, pain    5. High risk medication use  Z79.899 Prolactin      Past Psychiatric History: I have reviewed past psychiatric history from progress note on 06/10/2019.  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   Chemo tx's   Migraine    Morbid obesity (HCC)    Neuromuscular disorder (HCC)    neuropathy   Neuropathy    Personality disorder (HCC)    Pineal gland cyst    Post herpetic neuralgia    PTSD (post-traumatic stress disorder)    Renal disorder    S/P liver transplant ([place]) 1993   Sleep apnea    has not recieved cpap yet-other one was recalled    Past Surgical History:  Procedure Laterality Date   BONE MARROW BIOPSY  01/13/2015   BREAST BIOPSY  12/2014   BREAST BIOPSY  2011   BREAST SURGERY     CHOLECYSTECTOMY     COLONOSCOPY     COLONOSCOPY WITH PROPOFOL N/A 02/28/2022    Procedure: COLONOSCOPY WITH PROPOFOL;  Surgeon: [name], MD;  Location: [facility] ENDOSCOPY;  Service: Endoscopy;  Laterality: N/A;   ESOPHAGOGASTRODUODENOSCOPY     ESOPHAGOGASTRODUODENOSCOPY (EGD) WITH PROPOFOL N/A 01/09/2021   Procedure: ESOPHAGOGASTRODUODENOSCOPY (EGD) WITH PROPOFOL;  Surgeon: [name], MD;  Location: [facility] ENDOSCOPY;  Service: Endoscopy;  Laterality: N/A;   HERNIA REPAIR     x4-all from liver transplant   HYSTEROSCOPY WITH D & C N/A 11/13/2021   Procedure: DILATATION AND CURETTAGE /HYSTEROSCOPY;  Surgeon: Homero Fellers, MD;  Location: [facility] ORS;  Service: Gynecology;  Laterality: N/A;   infusaport     LIVER TRANSPLANT  12/17/1991   LUMBAR PUNCTURE     PORT A CATH INJECTION ([place] [facility])     RENAL BIOPSY     tumor removal  2015   cancer right side    Family Psychiatric History: Reviewed family psychiatric history from progress note on 06/10/2019.  Family History:  Family History  Problem Relation Age of Onset   Heart disease Mother    Hypertension Mother    Cancer - Other Mother    Bipolar disorder Mother    Heart disease Father    Hypertension Father    Diabetes Father    Parkinson's disease Maternal Grandmother    Cancer Maternal Aunt    Cancer Maternal Uncle    Cancer Maternal Grandfather    Lupus Paternal Grandmother    Hypertension Brother     Social History: Reviewed social history from progress note on 06/10/2019. Social History   Socioeconomic History   Marital status: Single    Spouse name: Not on file   Number of children: 0   Years of education: 9   Highest education level: GED or equivalent  Occupational History   Occupation: disabled  Tobacco Use   Smoking status: Never   Smokeless tobacco: Never  Vaping Use   Vaping Use: Never used  Substance and Sexual Activity   Alcohol use: Not Currently   Drug use: Not Currently    Comment: pain managment last used in early april   Sexual activity: Not Currently  Other Topics Concern    Not on file  Social History Narrative   Not on file   Social Determinants of Health   Financial Resource Strain: Low Risk  (10/15/2022)   Overall Financial Resource Strain (CARDIA)    Difficulty of Paying Living Expenses: Not very hard  Food Insecurity: No Food Insecurity (08/07/2022)   Hunger Vital Sign    Worried About Running Out of Food in the Last Year: Never true    Ran  Out of Food in the Last Year: Never true  Transportation Needs: No Transportation Needs (08/07/2022)   PRAPARE - Hydrologist (Medical): No    Lack of Transportation (Non-Medical): No  Physical Activity: Inactive (10/15/2022)   Exercise Vital Sign    Days of Exercise per Week: 0 days    Minutes of Exercise per Session: 0 min  Stress: Stress Concern Present (11/11/2022)   [place]    Feeling of Stress : To some extent  Social Connections: Moderately Integrated (09/10/2022)   Social Connection and Isolation Panel [NHANES]    Frequency of Communication with Friends and Family: More than three times a week    Frequency of Social Gatherings with Friends and Family: More than three times a week    Attends Religious Services: More than 4 times per year    Active Member of [employer] or Organizations: No    Attends Archivist Meetings: Never    Marital Status: Living with partner    Allergies:  Allergies  Allergen Reactions   Penicillin G Itching, Rash, Anaphylaxis and Palpitations   Lamictal [Lamotrigine] Rash   Carbamazepine Other (See Comments)    Medication interaction-prograf    Hydrocodone-Acetaminophen Itching   Naproxen Itching   Penicillins     Metabolic Disorder Labs: Lab Results  Component Value Date   HGBA1C 6.4 03/06/2022   MPG 209 06/05/2020   Lab Results  Component Value Date   PROLACTIN 13.4 07/15/2019   PROLACTIN 21.3 05/24/2019   Lab Results  Component Value Date   CHOL 81 05/27/2022    TRIG 203 (H) 05/27/2022   HDL 35 (L) 05/27/2022   CHOLHDL 2.3 05/27/2022   LDLCALC 19 05/27/2022   LDLCALC 101 (H) 12/03/2021   Lab Results  Component Value Date   TSH 2.650 07/15/2019   TSH 4.650 (H) 05/24/2019    Therapeutic Level Labs: No results found for: "LITHIUM" No results found for: "VALPROATE" No results found for: "CBMZ"  Current Medications: Current Outpatient Medications  Medication Sig Dispense Refill   baclofen (LIORESAL) 10 MG tablet Take 1 tablet by mouth 2 (two) times daily.     baclofen (LIORESAL) 20 MG tablet Take by mouth.     Cholecalciferol (VITAMIN D) 125 MCG (5000 UT) CAPS Take 5,000 Units by mouth daily.     clindamycin (CLEOCIN) 300 MG capsule Take 300 mg by mouth every 6 (six) hours.     Cranberry (RA CRANBERRY) 500 MG CAPS Take by mouth.     dronabinol (MARINOL) 2.5 MG capsule Take 2.5 mg by mouth See admin instructions. Takes every other day     Dulaglutide (TRULICITY) [id number] 0000000 SOPN Inject into the skin.     Dulaglutide 4.5 MG/0.5ML SOPN Inject 4.5 mg into the skin once a week.     ezetimibe (ZETIA) 10 MG tablet Take 10 mg by mouth every morning.     fluticasone (FLONASE) 50 MCG/ACT nasal spray Place into the nose.     hydrOXYzine (VISTARIL) 25 MG capsule Take by mouth.     icosapent Ethyl (VASCEPA) 1 g capsule Take 2 capsules (2 g total) by mouth 2 (two) times daily. 120 capsule 5   Lidocaine (GNP BURN RELIEF SPRAY) 0.5 % AERO Apply topically.     lisinopril (ZESTRIL) 20 MG tablet TAKE ONE TABLET BY MOUTH ONCE DAILY 90 tablet 3   magnesium oxide (MAG-OX) 400 MG tablet Take by mouth.  Melatonin 10 MG TABS Take 10 mg by mouth at bedtime.     meloxicam (MOBIC) 15 MG tablet Take 1 tablet (15 mg total) by mouth daily. 30 tablet 0   metFORMIN (GLUCOPHAGE) 1000 MG tablet TAKE ONE TABLET BY MOUTH TWICE DAILY with meals 180 tablet 2   methocarbamol (ROBAXIN) 500 MG tablet Take 1 tablet (500 mg total) by mouth 4 (four) times daily. 16 tablet 0    mirtazapine (REMERON) 15 MG tablet TAKE ONE TABLET BY MOUTH EVERYDAY AT BEDTIME 90 tablet 0   MYRBETRIQ 50 MG TB24 tablet TAKE ONE TABLET BY MOUTH ONCE DAILY 90 tablet 3   naloxone (NARCAN) nasal spray 4 mg/0.1 mL Place 0.4 mg into the nose once.     oxyCODONE (OXYCONTIN) 10 mg 12 hr tablet Take by mouth.     PRECISION QID TEST test strip      pregabalin (LYRICA) 200 MG capsule Take 1 capsule (200 mg total) by mouth 2 (two) times daily. 60 capsule 0   promethazine (PHENERGAN) 25 MG tablet Take by mouth.     rosuvastatin (CRESTOR) 10 MG tablet Take 1 tablet (10 mg total) by mouth daily. 90 tablet 3   SUMAtriptan (IMITREX) 100 MG tablet Take by mouth.     tacrolimus (PROGRAF) 1 MG capsule Take by mouth.     UBRELVY 50 MG TABS Take 1 tablet by mouth daily as needed.     XTAMPZA ER 9 MG C12A Take 1 capsule by mouth every 12 (twelve) hours.     XYLIMELTS 550 MG DISK Take 550 mg by mouth every 8 (eight) hours as needed (dry mouth).     benztropine (COGENTIN) 1 MG tablet Take 1 tablet (1 mg total) by mouth daily as needed for tremors. 90 tablet 0   lurasidone (LATUDA) 20 MG TABS tablet Take 1 tablet (20 mg total) by mouth daily with supper. 90 tablet 0   venlafaxine XR (EFFEXOR XR) 37.5 MG 24 hr capsule Take 1 capsule (37.5 mg total) by mouth daily with breakfast. Take along with 75 mg daily 90 capsule 0   venlafaxine XR (EFFEXOR XR) 75 MG 24 hr capsule Take 1 capsule (75 mg total) by mouth daily with breakfast. 90 capsule 0   No current facility-administered medications for this visit.     Musculoskeletal: Strength & Muscle Tone: within normal limits Gait & Station: normal Patient leans: N/A  Psychiatric Specialty Exam: Review of Systems  Psychiatric/Behavioral: Negative.    All other systems reviewed and are negative.   Blood pressure 104/73, pulse 87, temperature (!) 97.3 F (36.3 C), temperature source Skin, height '5\' 6"'$  (1.676 m), weight 255 lb 6.4 oz (115.8 kg).Body mass index is  41.22 kg/m.  General Appearance: Casual  Eye Contact:  Fair  Speech:  Normal Rate  Volume:  Normal  Mood:  Euthymic  Affect:  Congruent  Thought Process:  Goal Directed and Descriptions of Associations: Intact  Orientation:  Full (Time, Place, and Person)  Thought Content: Logical   Suicidal Thoughts:  No  Homicidal Thoughts:  No  Memory:  Immediate;   Fair Recent;   Fair Remote;   Fair  Judgement:  Fair  Insight:  Fair  Psychomotor Activity:  Normal  Concentration:  Concentration: Fair and Attention Span: Fair  Recall:  [employer] of Knowledge: Fair  Language: Fair  Akathisia:  No  Handed:  Right  AIMS (if indicated): done  Assets:  Communication Skills Desire for [place]  ADL's:  Intact  Cognition: WNL  Sleep:  Fair   Screenings: [place] Office Visit from 12/12/2022 in [place] Office Visit from 09/12/2022 in [place] Office Visit from 07/08/2022 in [place] Video Visit from 03/18/2022 in [place] Video Visit from 02/14/2022 in [place] Total Score 0 0 0 0 0      GAD-7    [place] Office Visit from 12/12/2022 in [place] Office Visit from 09/12/2022 in [place] Video Visit from 08/01/2022 in [place] Office Visit from 07/08/2022 in [place] Office Visit from 05/27/2022 in [facility]  Total GAD-7 Score '7 3 4 11 20      '$ PHQ2-9    [place] Office Visit from 12/12/2022 in [place] Office Visit from 09/12/2022 in [place] Video Visit from 08/01/2022 in [place] Office Visit from 07/08/2022 in [place] Office Visit from 05/27/2022 in [facility]  PHQ-2 Total Score 2 0 '1 4 6  '$ PHQ-9 Total Score 7 -- '7 15 23      '$ [place] Office Visit from 12/12/2022 in [place] ED from 10/21/2022 in [facility] Emergency Department at [facility] ED from 09/18/2022 in [facility] Emergency Department at [place] No Risk No Risk No Risk        Assessment and Plan: Alison [name] is a 47 year old Caucasian female on disability with bipolar disorder, PTSD, multiple medical problems was evaluated in office today.  Patient is currently stable.  Plan as noted below.  Plan Bipolar disorder type I depressed in remission Latuda 20 mg p.o. daily with supper Venlafaxine 112.5 mg p.o. daily Cogentin 1 mg p.o. daily as needed for side effects  GAD-stable Mirtazapine 15 mg p.o. nightly Continue CBT as needed  PTSD-stable Venlafaxine as prescribed Continue CBT  Insomnia-improving Mirtazapine 15 mg p.o. nightly Melatonin 5 to 10 mg p.o. nightly Continue CPAP Continue sleep hygiene techniques  High risk medication use-will order prolactin level.  Patient to go to [facility] lab.  Prolactin needs to be monitored since patient is on Latuda.  Patient is currently not due for a lipid panel.  She gets hemoglobin A1c repeated at her primary care provider frequently.  Follow-up in clinic in 3 months or sooner if needed.    Consent: Patient/Guardian gives verbal consent for treatment and assignment of benefits for services provided during this visit. Patient/Guardian expressed understanding and agreed to proceed.   This note was generated in part or whole with voice recognition software. Voice recognition is usually quite accurate but  there are transcription errors that can and very often do occur. I apologize for any typographical errors that were not detected and corrected.    [name], MD 12/13/2022, 12:36 PM

## 2022-12-14 LAB — PROLACTIN: Prolactin: 16.4 ng/mL (ref 4.8–33.4)

## 2022-12-30 DIAGNOSIS — E1122 Type 2 diabetes mellitus with diabetic chronic kidney disease: Secondary | ICD-10-CM | POA: Diagnosis not present

## 2022-12-30 DIAGNOSIS — N182 Chronic kidney disease, stage 2 (mild): Secondary | ICD-10-CM | POA: Diagnosis not present

## 2023-01-06 DIAGNOSIS — I10 Essential (primary) hypertension: Secondary | ICD-10-CM | POA: Diagnosis not present

## 2023-01-06 DIAGNOSIS — M5414 Radiculopathy, thoracic region: Secondary | ICD-10-CM | POA: Diagnosis not present

## 2023-01-06 DIAGNOSIS — F3189 Other bipolar disorder: Secondary | ICD-10-CM | POA: Diagnosis not present

## 2023-01-06 DIAGNOSIS — M79603 Pain in arm, unspecified: Secondary | ICD-10-CM | POA: Diagnosis not present

## 2023-01-06 DIAGNOSIS — G893 Neoplasm related pain (acute) (chronic): Secondary | ICD-10-CM | POA: Diagnosis not present

## 2023-01-06 DIAGNOSIS — M25519 Pain in unspecified shoulder: Secondary | ICD-10-CM | POA: Diagnosis not present

## 2023-01-06 DIAGNOSIS — G894 Chronic pain syndrome: Secondary | ICD-10-CM | POA: Diagnosis not present

## 2023-01-06 DIAGNOSIS — M5442 Lumbago with sciatica, left side: Secondary | ICD-10-CM | POA: Diagnosis not present

## 2023-01-06 DIAGNOSIS — Z79891 Long term (current) use of opiate analgesic: Secondary | ICD-10-CM | POA: Diagnosis not present

## 2023-01-06 DIAGNOSIS — M79669 Pain in unspecified lower leg: Secondary | ICD-10-CM | POA: Diagnosis not present

## 2023-01-06 DIAGNOSIS — M79606 Pain in leg, unspecified: Secondary | ICD-10-CM | POA: Diagnosis not present

## 2023-01-09 ENCOUNTER — Other Ambulatory Visit: Payer: Self-pay | Admitting: Physician Assistant

## 2023-01-09 DIAGNOSIS — N3281 Overactive bladder: Secondary | ICD-10-CM

## 2023-01-10 ENCOUNTER — Other Ambulatory Visit: Payer: Medicaid Other | Admitting: Obstetrics and Gynecology

## 2023-01-10 ENCOUNTER — Encounter: Payer: Self-pay | Admitting: Obstetrics and Gynecology

## 2023-01-10 DIAGNOSIS — E7849 Other hyperlipidemia: Secondary | ICD-10-CM | POA: Diagnosis not present

## 2023-01-10 DIAGNOSIS — K7581 Nonalcoholic steatohepatitis (NASH): Secondary | ICD-10-CM | POA: Diagnosis not present

## 2023-01-10 DIAGNOSIS — E1165 Type 2 diabetes mellitus with hyperglycemia: Secondary | ICD-10-CM | POA: Diagnosis not present

## 2023-01-10 DIAGNOSIS — I10 Essential (primary) hypertension: Secondary | ICD-10-CM | POA: Diagnosis not present

## 2023-01-10 NOTE — Patient Outreach (Signed)
Medicaid Managed Care   Nurse Care Manager Note  01/10/2023 Name:  Alison [name] MRN:  [id number] DOB:  09-08-1976  Alison [name] is an 47 y.o. year old female who is a primary patient of Alison Salines, FNP.  The [facility] Managed Care Coordination team was consulted for assistance with:    Chronic healthcare management needs, HLD, h/o lymphoma, OSA, migraines, tremor, HTN, DM, chronic pain, neuralgia, anxiety/depression/PTSD/Biploar, SLE, fibromyalgia, CKD  Alison [name] was given information about Medicaid Managed Care Coordination team services today. Alison [name] Patient agreed to services and verbal consent obtained.  Engaged with patient by telephone for follow up visit in response to provider referral for case management and/or care coordination services.   Assessments/Interventions:  Review of past medical history, allergies, medications, health status, including review of consultants reports, laboratory and other test data, was performed as part of comprehensive evaluation and provision of chronic care management services.  SDOH (Social Determinants of Health) assessments and interventions performed: SDOH Interventions    Flowsheet Row Patient Outreach Telephone from 01/10/2023 in [place] POPULATION HEALTH DEPARTMENT Patient Outreach Telephone from 12/11/2022 in [place] POPULATION HEALTH DEPARTMENT Patient Outreach Telephone from 11/11/2022 in [place] POPULATION HEALTH DEPARTMENT Patient Outreach Telephone from 10/15/2022 in [place] POPULATION HEALTH DEPARTMENT Patient Outreach Telephone from 09/10/2022 in [place] POPULATION HEALTH DEPARTMENT Patient Outreach Telephone from 08/07/2022 in [facility] Care Coordination  SDOH Interventions        Food Insecurity Interventions Intervention Not Indicated -- -- -- -- Intervention Not Indicated  Housing Interventions -- Intervention Not Indicated -- -- -- --  Transportation Interventions Intervention  Not Indicated -- -- -- -- Intervention Not Indicated  Utilities Interventions -- Intervention Not Indicated -- -- -- --  Alcohol Usage Interventions -- -- Intervention Not Indicated (Score <7) -- -- --  Financial Strain Interventions -- -- -- Intervention Not Indicated -- --  Physical Activity Interventions -- -- -- Other (Comments)  [patient does not feel like exercising right now] -- --  Stress Interventions -- -- Provide Counseling  [sees Psychiatrist on regular basis] -- -- --  Social Connections Interventions -- -- -- -- Intervention Not Indicated --     Care Plan  Allergies  Allergen Reactions   Penicillin G Itching, Rash, Anaphylaxis and Palpitations   Lamictal [Lamotrigine] Rash   Carbamazepine Other (See Comments)    Medication interaction-prograf    Hydrocodone-Acetaminophen Itching   Naproxen Itching   Penicillins     Medications Reviewed Today     Reviewed by Alison Chandler, RN (Registered Nurse) on 01/10/23 at 1234  Med List Status: <None>   Medication Order Taking? Sig Documenting Provider Last Dose Status Informant  baclofen (LIORESAL) 10 MG tablet [id number] No Take 1 tablet by mouth 2 (two) times daily. [provider] Taking Active   baclofen (LIORESAL) 20 MG tablet [id number] No Take by mouth. [provider] Taking Active   benztropine (COGENTIN) 1 MG tablet [id number]  Take 1 tablet (1 mg total) by mouth daily as needed for tremors. Alison Longs, MD  Active   Cholecalciferol (VITAMIN D) 125 MCG (5000 UT) CAPS [id number] No Take 5,000 Units by mouth daily. [provider] Taking Active Self  clindamycin (CLEOCIN) 300 MG capsule [id number] No Take 300 mg by mouth every 6 (six) hours. [provider] Taking Active   Cranberry (RA CRANBERRY) 500 MG CAPS [id number] No Take by mouth. [provider] Taking Active   dronabinol (MARINOL)  2.5 MG capsule [id number] No Take 2.5 mg by mouth See admin instructions. Takes every other  day [provider] Taking Active Self  Dulaglutide (TRULICITY) 0.75 MG/0.5ML SOPN [id number] No Inject into the skin. [provider] Taking Active   Dulaglutide 4.5 MG/0.5ML SOPN [id number] No Inject 4.5 mg into the skin once a week. [provider] Taking Active   ezetimibe (ZETIA) 10 MG tablet [id number] No Take 10 mg by mouth every morning. [provider] Taking Active Self  fluticasone (FLONASE) 50 MCG/ACT nasal spray [id number] No Place into the nose. [provider] Taking Active   hydrOXYzine (VISTARIL) 25 MG capsule [id number] No Take by mouth. [provider] Taking Active   icosapent Ethyl (VASCEPA) 1 g capsule [id number] No Take 2 capsules (2 g total) by mouth 2 (two) times daily. Alison Odea, MD Taking Active Self           Med Note Clearance Coots, Alison [name]   Tue Mar 19, 2022 10:24 AM) Taking 1 capsule twice daily per Dr. Patrecia Pace  Lidocaine (GNP BURN RELIEF SPRAY) 0.5 % AERO [id number] No Apply topically. [provider] Taking Active   lisinopril (ZESTRIL) 20 MG tablet [id number] No TAKE ONE TABLET BY MOUTH ONCE DAILY Alison Salines, FNP Taking Active   lurasidone (LATUDA) 20 MG TABS tablet [id number]  Take 1 tablet (20 mg total) by mouth daily with supper. Alison Longs, MD  Active   magnesium oxide (MAG-OX) 400 MG tablet [id number] No Take by mouth. [provider] Taking Active   Melatonin 10 MG TABS [id number] No Take 10 mg by mouth at bedtime. [provider] Taking Active Self  meloxicam (MOBIC) 15 MG tablet [id number] No Take 1 tablet (15 mg total) by mouth daily. Cuthriell, [name], PA-C Taking Active   metFORMIN (GLUCOPHAGE) 1000 MG tablet [id number] No TAKE ONE TABLET BY MOUTH TWICE DAILY with meals Alison Salines, FNP Taking Active   methocarbamol (ROBAXIN) 500 MG tablet [id number] No Take 1 tablet (500 mg total) by mouth 4 (four) times daily. Cuthriell, [name], PA-C Taking Active    mirtazapine (REMERON) 15 MG tablet [id number] No TAKE ONE TABLET BY MOUTH EVERYDAY AT BEDTIME Alison Longs, MD Taking Active   MYRBETRIQ 50 MG TB24 tablet [id number] No TAKE ONE TABLET BY MOUTH ONCE DAILY Vaillancourt, Samantha, PA-C Taking Active   naloxone (NARCAN) nasal spray 4 mg/0.1 mL [id number] No Place 0.4 mg into the nose once. [provider] Taking Active Self  oxyCODONE (OXYCONTIN) 10 mg 12 hr tablet [id number] No Take by mouth. [provider] Taking Active   PRECISION QID TEST test strip [id number] No  [provider] Taking Active   pregabalin (LYRICA) 200 MG capsule [id number] No Take 1 capsule (200 mg total) by mouth 2 (two) times daily. Phineas Semen, MD Taking Active   promethazine (PHENERGAN) 25 MG tablet [id number] No Take by mouth. [provider] Taking Active   rosuvastatin (CRESTOR) 10 MG tablet [id number] No Take 1 tablet (10 mg total) by mouth daily. Alison Odea, MD Taking Active   SUMAtriptan (IMITREX) 100 MG tablet [id number] No Take by mouth. [provider] Taking Active   tacrolimus (PROGRAF) 1 MG capsule [id number] No Take by mouth. [provider] Taking Active   UBRELVY 50 MG TABS [id number] No Take 1 tablet by mouth daily as needed. [provider] Taking Active   venlafaxine XR (EFFEXOR XR) 37.5 MG 24 hr capsule [id number]  Take 1 capsule (37.5 mg total) by  mouth daily with breakfast. Take along with 75 mg daily [name], [name], MD  Active   venlafaxine XR (EFFEXOR XR) 75 MG 24 hr capsule [id number]  Take 1 capsule (75 mg total) by mouth daily with breakfast. Alison LongsEappen, [name], MD  Active   XTAMPZA ER 9 MG C12A [id number] No Take 1 capsule by mouth every 12 (twelve) hours. [provider] Taking Active   XYLIMELTS 550 MG DISK [id number] No Take 550 mg by mouth every 8 (eight) hours as needed (dry mouth). 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 08/29/2021   Insomnia 07/20/2021   Bipolar disorder, in full remission, most recent episode mixed 07/20/2021   Bipolar 1 disorder, mixed, mild 05/09/2021   GAD (generalized anxiety disorder) 05/09/2021   Hepatic cirrhosis    Opioid dependence with opioid-induced disorder 12/20/2020   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   Bipolar 1 disorder, depressed, full remission 09/27/2020   Suprapubic pain 07/25/2020   Bipolar 1 disorder, depressed, mild 07/10/2020   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   Edema of lower extremity 09/16/2019   Carpal tunnel syndrome, left 07/26/2019   Cubital tunnel syndrome on left 07/26/2019   Bipolar I disorder, most recent episode depressed 07/12/2019   PTSD (post-traumatic stress disorder) 07/12/2019   Umbilical hernia without obstruction and without gangrene 06/17/2019   Encounter for surveillance of abnormal nevi 06/17/2019   Pineal gland cyst 06/17/2019   Chronic hip pain, left 06/02/2019   Lumbar spondylosis 06/02/2019   Mouth dryness 06/02/2019   Obesity (BMI 35.0-39.9 without comorbidity) 06/02/2019   Goals of care, counseling/discussion 05/18/2019   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   Marginal zone B-cell lymphoma 05/18/2019   Occipital neuralgia 05/13/2019   Plantar fasciitis of left foot 04/27/2019   Fibromyalgia 03/23/2019   Systemic lupus erythematosus (SLE) in adult 03/23/2019   Essential hypertension 03/23/2019   Hepatitis 03/23/2019   Mass of left kidney 03/23/2019   Diabetes mellitus 03/23/2019   Nausea without  vomiting 03/23/2019   Neuropathy 03/23/2019   Cancer 03/23/2019   Hyperlipidemia 03/23/2019   Osteoarthritis 03/23/2019   Trigeminal neuralgia 03/23/2019   Chronic renal disease, stage III 03/23/2019   Nephrolithiasis 03/23/2019   Migraine 03/23/2019   OSA on CPAP 03/23/2019   Fibrocystic breast 03/23/2019   Heart murmur 03/23/2019   Bipolar disorder, in partial remission, most recent episode depressed 07/02/2017   Abnormal uterine bleeding 03/18/2017   Major depressive disorder, recurrent 03/18/2017   Morbid obesity 03/18/2017   Secondary hyperparathyroidism 02/07/2017   Neuropathic pain 11/19/2016   Post herpetic neuralgia 11/19/2016   DLBCL (diffuse large B cell lymphoma) 01/13/2015   Lumbar disc disease with radiculopathy 07/26/2013   S/P liver transplant 07/26/2013   Type 2 diabetes mellitus, uncontrolled 07/26/2013   Facial nerve disorder 06/29/2012   Low back pain 12/26/2010   Conditions to be addressed/monitored per PCP order:  Chronic healthcare management needs, HLD, h/o lymphoma, OSA, migraines, tremor, HTN, DM, chronic pain, neuralgia, anxiety/depression/PTSD/Biploar, SLE, fibromyalgia, CKD  Care Plan : Chronic Pain (Adult)  Updates made  by Alison Chandler, RN since 01/10/2023 12:00 AM     Problem: Chronic Pain Management-fibromyalgia   Priority: High  Onset Date: 01/22/2021     Long-Range Goal: Fibromyalgia pain managed-new pain management provider   Start Date: 08/14/2020  Expected End Date: 04/11/2023  Recent Progress: On track  Priority: High  Note:   Current Barriers:  Knowledge deficits related to pain management  01/10/23:  patient with chest and left breast soreness after fall in bathtub 2 days ago-taking pain meds as needed.  Nurse Case Manager Clinical Goal(s):  Over the next 45 days, patient will work with [facility] to address needs related to referral for pain management provider and associated care coordination needs. 05/24/22:  patient is currently seeing  Dr. Welton Flakes at [facility].  Interventions:  Inter-disciplinary care team collaboration (see longitudinal plan of care) Evaluation of current treatment plan related to fibromyalgia  and patient's adherence to plan as established by provider. Update 06/15/21:  Patient taking Baclofen again, Effexor added. Collaborated with primary care provider regarding recommendations and referral to pain management provider. Discussed plans with patient for ongoing care management follow up and provided patient with direct contact information for care management team Anticipate pain education program, pain management support as part of pain management referral.       Patient Goals/Self-Care Activities Over the next 45 days, patient will:  -Attends all scheduled provider appointments  develop a personal pain management plan with your pain management provider.  Follow Up Plan:  The Managed Medicaid care management team will reach out to the patient again over the next 45 business days.    Care Plan : Wellness (Adult)  Updates made by Alison Chandler, RN since 01/10/2023 12:00 AM     Problem: Medication Adherence (Wellness)   Priority: High  Onset Date: 01/22/2021     Long-Range Goal: Medication Adherence Maintained   Start Date: 09/06/2020  Expected End Date: 04/11/2023  Recent Progress: On track  Priority: High  Note:   Current Barriers:  Chronic Disease Management of chronic health conditions, OSD, migraines, DM, HTN, chronic pain, anxiety/depression/ Bipolar/PTSD, SLE, LDD, HLD, fibromyalgia, osteoarthritis, LBP, DLBCL, liver transplant, lymphoma 01/10/23:  Patient with soreness left breast and chest after fall in bathtub-taking pain meds as needed.  BP stable at recent provider appt-does not check.  Blood sugar 318 this morning after eating ice cream last night-normally 200s.  Nephrology appt WNL    Nurse Case Manager Clinical Goal(s):  Over the next 30 days, patient will work with  CM team pharmacist to review current medications. Update 01/22/21:  Patient met with Pharmacist and continues to follow. Over the next 30 days, patient will meet with Nutritionist and check her blood sugars. Over the next 30 days, patient will attend all scheduled appointments.  Interventions:  Inter-disciplinary care team collaboration (see longitudinal plan of care) Evaluation of current treatment plan and patient's adherence to plan as established by provider. Advised patient to contact her PCP for any medication needs. Reviewed medications with patient. Collaborated with pharmacy regarding medications.  Discussed plans with patient for ongoing care management follow up and provided patient with direct contact information for care management team Pharmacy referral for medication review. Patient given phone number for [facility] transportation if needed. Will notify PCP of need for testing strips and dietician referral. Collaborated with PCP for cardiology appointment. Collaborated with [name] for dental resources. [name] referral for dental resources-completed.  Hyperlipidemia Interventions:  (Status:  New  goal.) Long Term Goal Medication review performed; medication list updated in electronic medical record.  Provider established cholesterol goals reviewed Counseled on importance of regular laboratory monitoring as prescribed Reviewed importance of limiting foods high in cholesterol Reviewed exercise goals and target of 150 minutes per week Screening for signs and symptoms of depression related to chronic disease state Assessed social determinant of health barriers    Diabetes Interventions:  (Status:  New goal.) Long Term Goal Assessed patient's understanding of A1c goal: <7% Reviewed medications with patient and discussed importance of medication adherence Counseled on importance of regular laboratory monitoring as prescribed Discussed plans with patient for ongoing  care management follow up and provided patient with direct contact information for care management team Reviewed scheduled/upcoming provider appointments Advised patient, providing education and rationale, to check cbg as directed and record, calling provider for findings outside established parameters Review of patient status, including review of consultants reports, relevant laboratory and other test results, and medications completed Assessed social determinant of health barriers Lab Results  Component Value Date   HGBA1C 6.4 03/06/2022  Hyperlipidemia Interventions:  (Status:  New goal.) Long Term Goal Medication review performed; medication list updated in electronic medical record.  Provider established cholesterol goals reviewed Reviewed importance of limiting foods high in cholesterol Assessed social determinant of health barriers   Hypertension Interventions:  (Status:  New goal.) Long Term Goal Last practice recorded BP readings:  BP Readings from Last 3 Encounters:  10/21/22 (!) 142/106  09/18/22 113/85  06/21/22 104/62  12/03/22         130/81 12/12/22        104/73 Most recent eGFR/CrCl:  Lab Results  Component Value Date   EGFR 29 (L) 05/27/2022    No components found for: "CRCL"  Evaluation of current treatment plan related to hypertension self management and patient's adherence to plan as established by provider Reviewed medications with patient and discussed importance of compliance Discussed plans with patient for ongoing care management follow up and provided patient with direct contact information for care management team Advised patient, providing education and rationale, to monitor blood pressure daily and record, calling PCP for findings outside established parameters Reviewed scheduled/upcoming provider appointments including:  Provided education on prescribed diet Discussed complications of poorly controlled blood pressure such as heart disease, stroke,  circulatory complications, vision complications, kidney impairment, sexual dysfunction Assessed social determinant of health barriers  Pain Interventions:  (Status:  New goal.) Long Term Goal Pain assessment performed Medications reviewed Reviewed provider established plan for pain management Discussed importance of adherence to all scheduled medical appointments Counseled on the importance of reporting any/all new or changed pain symptoms or management strategies to pain management provider Advised patient to report to care team affect of pain on daily activities Discussed use of relaxation techniques and/or diversional activities to assist with pain reduction (distraction, imagery, relaxation, massage, acupressure, TENS, heat, and cold application Reviewed with patient prescribed pharmacological and nonpharmacological pain relief strategies Assessed social determinant of health barriers   Patient Goals/Self-Care Activities Over the next 30 days, patient will:  -Patient will take medications as prescribed. Calls pharmacy for medication refills Calls provider office for new concerns or questions  Follow Up Plan: The Managed Medicaid care management team will reach out to the patient again over the next 45 business days.  The patient has been provided with contact information for the Managed Medicaid care management team and has been advised to call with any health related questions or concerns.  Follow Up:  Patient agrees to Care Plan and Follow-up.  Plan: The Managed Medicaid care management team will reach out to the patient again over the next 45 business  days. and The  Patient has been provided with contact information for the Managed Medicaid care management team and has been advised to call with any health related questions or concerns.  Date/time of next scheduled RN care management/care coordination outreach: 02/19/23 at 230

## 2023-01-10 NOTE — Patient Instructions (Signed)
Hi Alison [name], I hope you feel better, if not please contact your provider  Alison [name] was given information about Medicaid Managed Care team care coordination services as a part of their [facility] benefit. Prudencio Pairhristy L Neuzil verbally consented to engagement with the [facility].   If you are experiencing a medical emergency, please call 911 or report to your local emergency department or urgent care.   If you have a non-emergency medical problem during routine business hours, please contact your provider's office and ask to speak with a nurse.   For questions related to your [facility] health plan, please call: [phone] or visit the homepage here: [email or web address]  If you would like to schedule transportation through your [facility] plan, please call the following number at least 2 days in advance of your appointment: [phone]  For information about your ride after you set it up, call Ride Assist at [phone]. Use this number to activate a Will Call pickup, or if your transportation is late for a scheduled pickup. Use this number, too, if you need to make a change or cancel a previously scheduled reservation.  If you need transportation services right away, call [phone]. The after-hours call center is staffed 24 hours to handle ride assistance and urgent reservation requests (including discharges) 365 days a year. Urgent trips include sick visits, hospital discharge requests and life-sustaining treatment.  Call the [facility] Crisis Line at [phone], at any time, 24 hours a day, 7 days a week. If you are in danger or need immediate medical attention call 911.  If you would like help to quit smoking, call 1-800-QUIT-NOW (310-487-92841-7876668450) OR Espaol: 1-855-Djelo-Ya (8-756-433-2951(1-9722179172) o para ms informacin haga clic aqu or Text READY to 884-166200-400 to register via text  Alison [name] -  following are the goals we discussed in your visit today:   Goals Addressed             This Visit's Progress    Chronic Pain Managed       01/10/23:  Some soreness today after falling in bathtub-chest and left breast-taking pain meds     Get sugars at goal       Timeframe:  Short-Term Goal Priority:  High Start Date:                             Expected End Date:      ongoing                 Follow Up Date: 02/19/23   - call for medicine refill 2 or 3 days before it runs out - call if I am sick and can't take my medicine - keep a list of all the medicines I take; vitamins and herbals too    Why is this important?   These steps will help you keep on track with your medicines. 01/10/23:  Blood sugars 200s per patient, 318 this morning   Patient verbalizes understanding of instructions and care plan provided today and agrees to view in MyChart. Active MyChart status and patient understanding of how to access instructions and care plan via MyChart confirmed with patient.     The Managed Medicaid care management team will reach out to the patient again over the next 45 business  days.  The  Patient  has been provided with contact information for the Managed Medicaid care management team and has been advised to  call with any health related questions or concerns.   Kathi Dererri Aneta Hendershott RN, [name]   [facility] Care Management Coordinator - Managed Medicaid High Risk [phone]   Following is a copy of your plan of care:  Care Plan : Chronic Pain (Adult)  Updates made by Danie Chandlerraft, Nekhi Liwanag G, RN since 01/10/2023 12:00 AM     Problem: Chronic Pain Management-fibromyalgia   Priority: High  Onset Date: 01/22/2021     Long-Range Goal: Fibromyalgia pain managed-new pain management provider   Start Date: 08/14/2020  Expected End Date: 04/11/2023  Recent Progress: On track  Priority: High  Note:   Current Barriers:  Knowledge deficits related to pain management  01/10/23:   patient with chest and left breast soreness after fall in bathtub 2 days ago-taking pain meds as needed.  Nurse Case Manager Clinical Goal(s):  Over the next 45 days, patient will work with [facility] to address needs related to referral for pain management provider and associated care coordination needs. 05/24/22:  [facility]  Interventions:  Inter-disciplinary care team collaboration (see longitudinal plan of care) Evaluation of current treatment plan related to fibromyalgia  and patient's adherence to plan as established by provider. Update 06/15/21:  Patient taking Baclofen again, Effexor added. Collaborated with primary care provider regarding recommendations and referral to pain management provider. Discussed plans with patient for ongoing care management follow up and provided patient with direct contact information for care management team Anticipate pain education program, pain management support as part of pain management referral.       Patient Goals/Self-Care Activities Over the next 45 days, patient will:  -Attends all scheduled provider appointments  develop a personal pain management plan with your pain management provider.  Follow Up Plan:  The Managed Medicaid care management team will reach out to the patient again over the next 45 business days   Care Plan : Wellness (Adult)  Updates made by Danie Chandlerraft, Britney Newstrom G, RN since 01/10/2023 12:00 AM     Problem: Medication Adherence (Wellness)   Priority: High  Onset Date: 01/22/2021     Long-Range Goal: Medication Adherence Maintained   Start Date: 09/06/2020  Expected End Date: 04/11/2023  Recent Progress: On track  Priority: High  Note:   Current Barriers:  Chronic Disease Management of chronic health conditions, OSD, migraines, DM, HTN, chronic pain, anxiety/depression/ Bipolar/PTSD, SLE, LDD, HLD, fibromyalgia, osteoarthritis, LBP, DLBCL, liver transplant,  lymphoma 01/10/23:  Patient with soreness left breast and chest after fall in bathtub-taking pain meds as needed.  BP stable at recent provider appt-does not check.  Blood sugar 318 this morning after eating ice cream last night-normally 200s.  Nephrology appt WNL  Nurse Case Manager Clinical Goal(s):  Over the next 30 days, patient will work with CM team pharmacist to review current medications. Update 01/22/21:  Patient met with Pharmacist and continues to follow. Over the next 30 days, patient will meet with Nutritionist and check her blood sugars. Over the next 30 days, patient will attend all scheduled appointments.  Interventions:  Inter-disciplinary care team collaboration (see longitudinal plan of care) Evaluation of current treatment plan and patient's adherence to plan as established by provider. Advised patient to contact her PCP for any medication needs. Reviewed medications with patient. Collaborated with pharmacy regarding medications.  Discussed plans with patient for ongoing care management follow up and provided patient with direct contact information for care management team Pharmacy referral for medication review.  Patient given phone number for [facility] transportation if needed. Will notify PCP of need for testing strips and dietician referral. Collaborated with PCP for cardiology appointment. Collaborated with [name] for dental resources. [name] referral for dental resources-completed.  Hyperlipidemia Interventions:  (Status:  New goal.) Long Term Goal Medication review performed; medication list updated in electronic medical record.  Provider established cholesterol goals reviewed Counseled on importance of regular laboratory monitoring as prescribed Reviewed importance of limiting foods high in cholesterol Reviewed exercise goals and target of 150 minutes per week Screening for signs and symptoms of depression related to chronic disease state Assessed  social determinant of health barriers    Diabetes Interventions:  (Status:  New goal.) Long Term Goal Assessed patient's understanding of A1c goal: <7% Reviewed medications with patient and discussed importance of medication adherence Counseled on importance of regular laboratory monitoring as prescribed Discussed plans with patient for ongoing care management follow up and provided patient with direct contact information for care management team Reviewed scheduled/upcoming provider appointments Advised patient, providing education and rationale, to check cbg as directed and record, calling provider for findings outside established parameters Review of patient status, including review of consultants reports, relevant laboratory and other test results, and medications completed Assessed social determinant of health barriers Lab Results  Component Value Date   HGBA1C 6.4 03/06/2022  Hyperlipidemia Interventions:  (Status:  New goal.) Long Term Goal Medication review performed; medication list updated in electronic medical record.  Provider established cholesterol goals reviewed Reviewed importance of limiting foods high in cholesterol Assessed social determinant of health barriers   Hypertension Interventions:  (Status:  New goal.) Long Term Goal Last practice recorded BP readings:  BP Readings from Last 3 Encounters:  10/21/22 (!) 142/106  09/18/22 113/85  06/21/22 104/62  12/03/22         130/81 12/12/22        104/73 Most recent eGFR/CrCl:  Lab Results  Component Value Date   EGFR 29 (L) 05/27/2022    No components found for: "CRCL"  Evaluation of current treatment plan related to hypertension self management and patient's adherence to plan as established by provider Reviewed medications with patient and discussed importance of compliance Discussed plans with patient for ongoing care management follow up and provided patient with direct contact information for care management  team Advised patient, providing education and rationale, to monitor blood pressure daily and record, calling PCP for findings outside established parameters Reviewed scheduled/upcoming provider appointments including:  Provided education on prescribed diet Discussed complications of poorly controlled blood pressure such as heart disease, stroke, circulatory complications, vision complications, kidney impairment, sexual dysfunction Assessed social determinant of health barriers  Pain Interventions:  (Status:  New goal.) Long Term Goal Pain assessment performed Medications reviewed Reviewed provider established plan for pain management Discussed importance of adherence to all scheduled medical appointments Counseled on the importance of reporting any/all new or changed pain symptoms or management strategies to pain management provider Advised patient to report to care team affect of pain on daily activities Discussed use of relaxation techniques and/or diversional activities to assist with pain reduction (distraction, imagery, relaxation, massage, acupressure, TENS, heat, and cold application Reviewed with patient prescribed pharmacological and nonpharmacological pain relief strategies Assessed social determinant of health barriers   Patient Goals/Self-Care Activities Over the next 30 days, patient will:  -Patient will take medications as prescribed. Calls pharmacy for medication refills Calls provider office for new concerns or questions  Follow Up Plan: The Managed  Medicaid care management team will reach out to the patient again over the next 45 business days.  The patient has been provided with contact information for the Managed Medicaid care management team and has been advised to call with any health related questions or concerns.

## 2023-01-16 ENCOUNTER — Telehealth: Payer: Self-pay | Admitting: *Deleted

## 2023-01-16 ENCOUNTER — Other Ambulatory Visit: Payer: Self-pay | Admitting: *Deleted

## 2023-01-16 DIAGNOSIS — Z6839 Body mass index (BMI) 39.0-39.9, adult: Secondary | ICD-10-CM | POA: Diagnosis not present

## 2023-01-16 DIAGNOSIS — Z944 Liver transplant status: Secondary | ICD-10-CM | POA: Diagnosis not present

## 2023-01-16 DIAGNOSIS — M797 Fibromyalgia: Secondary | ICD-10-CM | POA: Diagnosis not present

## 2023-01-16 DIAGNOSIS — I10 Essential (primary) hypertension: Secondary | ICD-10-CM | POA: Diagnosis not present

## 2023-01-16 DIAGNOSIS — E1165 Type 2 diabetes mellitus with hyperglycemia: Secondary | ICD-10-CM | POA: Diagnosis not present

## 2023-01-16 DIAGNOSIS — R52 Pain, unspecified: Secondary | ICD-10-CM | POA: Diagnosis not present

## 2023-01-16 DIAGNOSIS — E7849 Other hyperlipidemia: Secondary | ICD-10-CM | POA: Diagnosis not present

## 2023-01-16 DIAGNOSIS — E669 Obesity, unspecified: Secondary | ICD-10-CM | POA: Diagnosis not present

## 2023-01-16 DIAGNOSIS — F411 Generalized anxiety disorder: Secondary | ICD-10-CM | POA: Diagnosis not present

## 2023-01-16 DIAGNOSIS — C8580 Other specified types of non-Hodgkin lymphoma, unspecified site: Secondary | ICD-10-CM | POA: Diagnosis not present

## 2023-01-16 DIAGNOSIS — F3131 Bipolar disorder, current episode depressed, mild: Secondary | ICD-10-CM | POA: Diagnosis not present

## 2023-01-16 DIAGNOSIS — K7581 Nonalcoholic steatohepatitis (NASH): Secondary | ICD-10-CM | POA: Diagnosis not present

## 2023-01-16 NOTE — Telephone Encounter (Signed)
Contacted pt concerning refill for Vascepa 1 gm capsule to confirm instructions and dose.  Due to notes saying medication was changed by Dr. Patrecia Pace Vascepa 1 gm 1 capsule bid. Pt was currently at Dr. Patrecia Pace office and mentioned that she will address this refill at that office visit because she is due for f/u and we haven't filled it since 2022. No refill needed at this time.

## 2023-01-20 ENCOUNTER — Other Ambulatory Visit: Payer: Medicaid Other

## 2023-01-20 NOTE — Patient Outreach (Signed)
Medicaid Managed Care Social Work Note  01/20/2023 Name:  Alison [name] MRN:  [id number] DOB:  Aug 04, 1976  Alison [name] is an 47 y.o. year old female who is a primary patient of Alison Salines, FNP.  The Medicaid Managed Care Coordination team was consulted for assistance with:  Community Resources   [name] was given information about Medicaid Managed Care Coordination team services today. Alison [name] Patient agreed to services and verbal consent obtained.  Engaged with patient  for by telephone forfollow up visit in response to referral for case management and/or care coordination services.   Assessments/Interventions:  Review of past medical history, allergies, medications, health status, including review of consultants reports, laboratory and other test data, was performed as part of comprehensive evaluation and provision of chronic care management services.  SDOH: (Social Determinant of Health) assessments and interventions performed: SDOH Interventions    Flowsheet Row Patient Outreach Telephone from 01/10/2023 in [place] POPULATION HEALTH DEPARTMENT Patient Outreach Telephone from 12/11/2022 in [place] POPULATION HEALTH DEPARTMENT Patient Outreach Telephone from 11/11/2022 in [place] POPULATION HEALTH DEPARTMENT Patient Outreach Telephone from 10/15/2022 in [place] POPULATION HEALTH DEPARTMENT Patient Outreach Telephone from 09/10/2022 in  POPULATION HEALTH DEPARTMENT Patient Outreach Telephone from 08/07/2022 in [facility] Community Care Coordination  SDOH Interventions        Food Insecurity Interventions Intervention Not Indicated -- -- -- -- Intervention Not Indicated  Housing Interventions -- Intervention Not Indicated -- -- -- --  Transportation Interventions Intervention Not Indicated -- -- -- -- Intervention Not Indicated  Utilities Interventions -- Intervention Not Indicated -- -- -- --  Alcohol Usage Interventions -- --  Intervention Not Indicated (Score <7) -- -- --  Financial Strain Interventions -- -- -- Intervention Not Indicated -- --  Physical Activity Interventions -- -- -- Other (Comments)  [patient does not feel like exercising right now] -- --  Stress Interventions -- -- Provide Counseling  [sees Psychiatrist on regular basis] -- -- --  Social Connections Interventions -- -- -- -- Intervention Not Indicated --      [name] completed a telephone outreach with patient. She stated everything is going well. She was able to get her dental appointment and it is scheduled for 01/21/23. Patient states no resources are needed.  Advanced Directives Status:  Not addressed in this encounter.  Care Plan                 Allergies  Allergen Reactions   Penicillin G Itching, Rash, Anaphylaxis and Palpitations   Lamictal [Lamotrigine] Rash   Carbamazepine Other (See Comments)    Medication interaction-prograf    Hydrocodone-Acetaminophen Itching   Naproxen Itching   Penicillins     Medications Reviewed Today     Reviewed by [name], RN (Registered Nurse) on 01/10/23 at 1234  Med List Status: <None>   Medication Order Taking? Sig Documenting Provider Last Dose Status Informant  baclofen (LIORESAL) 10 MG tablet [id number] No Take 1 tablet by mouth 2 (two) times daily. [provider] Taking Active   baclofen (LIORESAL) 20 MG tablet [id number] No Take by mouth. [provider] Taking Active   benztropine (COGENTIN) 1 MG tablet [id number]  Take 1 tablet (1 mg total) by mouth daily as needed for tremors. Alison Longs, MD  Active   Cholecalciferol (VITAMIN D) 125 MCG (5000 UT) CAPS [id number] No Take 5,000 Units by mouth daily. [provider] Taking Active Self  clindamycin (CLEOCIN) 300 MG  capsule [id number] No Take 300 mg by mouth every 6 (six) hours. [provider] Taking Active   Cranberry (RA CRANBERRY) 500 MG CAPS [id number] No Take by mouth. [provider]  Taking Active   dronabinol (MARINOL) 2.5 MG capsule [id number] No Take 2.5 mg by mouth See admin instructions. Takes every other day [provider] Taking Active Self  Dulaglutide (TRULICITY) 0.75 MG/0.5ML SOPN [id number] No Inject into the skin. [provider] Taking Active   Dulaglutide 4.5 MG/0.5ML SOPN [id number] No Inject 4.5 mg into the skin once a week. [provider] Taking Active   ezetimibe (ZETIA) 10 MG tablet [id number] No Take 10 mg by mouth every morning. [provider] Taking Active Self  fluticasone (FLONASE) 50 MCG/ACT nasal spray [id number] No Place into the nose. [provider] Taking Active   hydrOXYzine (VISTARIL) 25 MG capsule [id number] No Take by mouth. [provider] Taking Active   icosapent Ethyl (VASCEPA) 1 g capsule [id number] No Take 2 capsules (2 g total) by mouth 2 (two) times daily. Alison Odea, MD Taking Active Self           Med Note Clearance [name], Alison T   Tue Mar 19, 2022 10:24 AM) Taking 1 capsule twice daily per Dr. Patrecia [name]  Lidocaine (GNP BURN RELIEF SPRAY) 0.5 % AERO [id number] No Apply topically. [provider] Taking Active   lisinopril (ZESTRIL) 20 MG tablet [id number] No TAKE ONE TABLET BY MOUTH ONCE DAILY Alison Salines, FNP Taking Active   lurasidone (LATUDA) 20 MG TABS tablet [id number]  Take 1 tablet (20 mg total) by mouth daily with supper. Alison Longs, MD  Active   magnesium oxide (MAG-OX) 400 MG tablet [id number] No Take by mouth. [provider] Taking Active   Melatonin 10 MG TABS [id number] No Take 10 mg by mouth at bedtime. [provider] Taking Active Self  meloxicam (MOBIC) 15 MG tablet [id number] No Take 1 tablet (15 mg total) by mouth daily. Cuthriell, Alison Royals, PA-C Taking Active   metFORMIN (GLUCOPHAGE) 1000 MG tablet [id number] No TAKE ONE TABLET BY MOUTH TWICE DAILY with meals Alison Salines, FNP Taking Active   methocarbamol (ROBAXIN) 500 MG  tablet [id number] No Take 1 tablet (500 mg total) by mouth 4 (four) times daily. Cuthriell, Alison Royals, PA-C Taking Active   mirtazapine (REMERON) 15 MG tablet [id number] No TAKE ONE TABLET BY MOUTH EVERYDAY AT BEDTIME Alison Longs, MD Taking Active   MYRBETRIQ 50 MG TB24 tablet [id number] No TAKE ONE TABLET BY MOUTH ONCE DAILY Vaillancourt, Samantha, PA-C Taking Active   naloxone (NARCAN) nasal spray 4 mg/0.1 mL [id number] No Place 0.4 mg into the nose once. [provider] Taking Active Self  oxyCODONE (OXYCONTIN) 10 mg 12 hr tablet [id number] No Take by mouth. [provider] Taking Active   PRECISION QID TEST test strip [id number] No  [provider] Taking Active   pregabalin (LYRICA) 200 MG capsule [id number] No Take 1 capsule (200 mg total) by mouth 2 (two) times daily. Phineas Semen, MD Taking Active   promethazine (PHENERGAN) 25 MG tablet [id number] No Take by mouth. [provider] Taking Active   rosuvastatin (CRESTOR) 10 MG tablet [id number] No Take 1 tablet (10 mg total) by mouth daily. Alison Odea, MD Taking Active   SUMAtriptan (IMITREX) 100 MG tablet [id number] No Take by mouth. [provider] Taking Active   tacrolimus (PROGRAF) 1 MG capsule [id number] No Take by mouth. [provider] Taking Active  UBRELVY 50 MG TABS [id number] No Take 1 tablet by mouth daily as needed. [provider] Taking Active   venlafaxine XR (EFFEXOR XR) 37.5 MG 24 hr capsule [id number]  Take 1 capsule (37.5 mg total) by mouth daily with breakfast. Take along with 75 mg daily [name], [name], MD  Active   venlafaxine XR (EFFEXOR XR) 75 MG 24 hr capsule [id number]  Take 1 capsule (75 mg total) by mouth daily with breakfast. Alison Longs, MD  Active   XTAMPZA ER 9 MG C12A [id number] No Take 1 capsule by mouth every 12 (twelve) hours. [provider] Taking Active   XYLIMELTS 550 MG DISK [id number] No Take 550 mg by mouth every 8 (eight)  hours as needed (dry mouth). [provider] Taking Active Self            Patient Active Problem List   Diagnosis Date Noted   High risk medication use 12/12/2022   Bipolar 1 disorder, depressed, moderate 07/08/2022   Sjogren's syndrome 05/27/2022   Encounter for screening colonoscopy    Menorrhagia with irregular cycle    Akathisia 10/24/2021   At risk for prolonged QT interval syndrome 09/12/2021   Prolonged Q-T interval on ECG 08/29/2021   Insomnia 07/20/2021   Bipolar disorder, in full remission, most recent episode mixed 07/20/2021   Bipolar 1 disorder, mixed, mild 05/09/2021   GAD (generalized anxiety disorder) 05/09/2021   Hepatic cirrhosis    Opioid dependence with opioid-induced disorder 12/20/2020   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   Bipolar 1 disorder, depressed, full remission 09/27/2020   Suprapubic pain 07/25/2020   Bipolar 1 disorder, depressed, mild 07/10/2020   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   Edema of lower extremity 09/16/2019   Carpal tunnel syndrome, left 07/26/2019   Cubital tunnel syndrome on left 07/26/2019   Bipolar I disorder, most recent episode depressed 07/12/2019   PTSD (post-traumatic stress disorder) 07/12/2019   Umbilical hernia without obstruction and without gangrene 06/17/2019   Encounter for surveillance of abnormal nevi 06/17/2019   Pineal gland cyst 06/17/2019   Chronic hip pain, left 06/02/2019   Lumbar spondylosis 06/02/2019   Mouth dryness 06/02/2019   Obesity (BMI 35.0-39.9 without comorbidity) 06/02/2019   Goals of care, counseling/discussion 05/18/2019   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   Marginal zone B-cell lymphoma 05/18/2019   Occipital neuralgia 05/13/2019   Plantar fasciitis of left foot 04/27/2019   Fibromyalgia 03/23/2019   Systemic lupus erythematosus (SLE) in adult 03/23/2019   Essential hypertension  03/23/2019   Hepatitis 03/23/2019   Mass of left kidney 03/23/2019   Diabetes mellitus 03/23/2019   Nausea without vomiting 03/23/2019   Neuropathy 03/23/2019   Cancer 03/23/2019   Hyperlipidemia 03/23/2019   Osteoarthritis 03/23/2019   Trigeminal neuralgia 03/23/2019   Chronic renal disease, stage III 03/23/2019   Nephrolithiasis 03/23/2019   Migraine 03/23/2019   OSA on CPAP 03/23/2019   Fibrocystic breast 03/23/2019   Heart murmur 03/23/2019   Bipolar disorder, in partial remission, most recent episode depressed 07/02/2017   Abnormal uterine bleeding 03/18/2017   Major depressive disorder, recurrent 03/18/2017   Morbid obesity 03/18/2017   Secondary hyperparathyroidism 02/07/2017   Neuropathic pain 11/19/2016   Post herpetic neuralgia 11/19/2016   DLBCL (diffuse large B cell lymphoma) 01/13/2015   Lumbar disc disease with radiculopathy 07/26/2013   S/P liver transplant 07/26/2013   Type 2 diabetes mellitus, uncontrolled 07/26/2013   Facial nerve disorder 06/29/2012   Low back pain  12/26/2010    Conditions to be addressed/monitored per PCP order:   community resources  There are no care plans that you recently modified to display for this patient.   Follow up:  Patient agrees to Care Plan and Follow-up.  Plan: The  Patient has been provided with contact information for the Managed Medicaid care management team and has been advised to call with any health related questions or concerns.    Abelino Derrick, MHA [facility]  Managed [facility] Social Worker [phone]

## 2023-01-20 NOTE — Patient Instructions (Signed)
Visit Information  Alison [name] was given information about Medicaid Managed Care team care coordination services as a part of their Healthy [facility] Medicaid benefit. [name] verbally consented to engagement with the [facility] Managed Care team.   If you are experiencing a medical emergency, please call 911 or report to your local emergency department or urgent care.   If you have a non-emergency medical problem during routine business hours, please contact your provider's office and ask to speak with a nurse.   For questions related to your Healthy [facility] health plan, please call: [phone] or visit the homepage here: [email or web address]  If you would like to schedule transportation through your Healthy [facility] plan, please call the following number at least 2 days in advance of your appointment: [phone]  For information about your ride after you set it up, call Ride Assist at [phone]. Use this number to activate a Will Call pickup, or if your transportation is late for a scheduled pickup. Use this number, too, if you need to make a change or cancel a previously scheduled reservation.  If you need transportation services right away, call [phone]. The after-hours call center is staffed 24 hours to handle ride assistance and urgent reservation requests (including discharges) 365 days a year. Urgent trips include sick visits, hospital discharge requests and life-sustaining treatment.  Call the [facility] Line at [phone], at any time, 24 hours a day, 7 days a week. If you are in danger or need immediate medical attention call 911.  If you would like help to quit smoking, call 1-800-QUIT-NOW (6670127803) OR Espaol: 1-855-Djelo-Ya (7-858-850-2774) o para ms informacin haga clic aqu or Text READY to 128-786 to register via text  Alison [name] - following are the goals we discussed in your visit today:    Goals Addressed   None      The  Patient                                              has been provided with contact information for the Managed Medicaid care management team and has been advised to call with any health related questions or concerns.   Gus Puma, Kenard Gower, MHA [facility]  Managed Medicaid Social Worker [phone]   Following is a copy of your plan of care:  There are no care plans that you recently modified to display for this patient.

## 2023-01-21 ENCOUNTER — Other Ambulatory Visit: Payer: Self-pay | Admitting: Nurse Practitioner

## 2023-01-21 DIAGNOSIS — G4701 Insomnia due to medical condition: Secondary | ICD-10-CM

## 2023-01-21 DIAGNOSIS — F411 Generalized anxiety disorder: Secondary | ICD-10-CM

## 2023-01-21 DIAGNOSIS — F4310 Post-traumatic stress disorder, unspecified: Secondary | ICD-10-CM

## 2023-01-21 NOTE — Telephone Encounter (Signed)
Medication Refill - Medication: mirtazapine (REMERON) 15 MG tablet   Has the patient contacted their pharmacy? Yes.     Pharmacy is calling regarding medication refill request. Per  Dorathy Daft from the pharmacy they have sent over medication requests.    Preferred Pharmacy (with phone number or street name):  [facility] - [place], [place] - [address] Phone: [phone]  Fax: [phone]     Has the patient been seen for an appointment in the last year OR does the patient have an upcoming appointment? Yes.    Agent: Please be advised that RX refills may take up to 3 business days. We ask that you follow-up with your pharmacy.

## 2023-01-22 ENCOUNTER — Ambulatory Visit: Payer: Medicaid Other | Admitting: Nurse Practitioner

## 2023-01-22 ENCOUNTER — Encounter: Payer: Self-pay | Admitting: Nurse Practitioner

## 2023-01-22 VITALS — BP 116/74 | HR 97 | Temp 98.3°F | Resp 16 | Ht 66.5 in | Wt 252.2 lb

## 2023-01-22 DIAGNOSIS — M3500 Sicca syndrome, unspecified: Secondary | ICD-10-CM

## 2023-01-22 DIAGNOSIS — N184 Chronic kidney disease, stage 4 (severe): Secondary | ICD-10-CM | POA: Diagnosis not present

## 2023-01-22 DIAGNOSIS — Z944 Liver transplant status: Secondary | ICD-10-CM

## 2023-01-22 DIAGNOSIS — M329 Systemic lupus erythematosus, unspecified: Secondary | ICD-10-CM | POA: Diagnosis not present

## 2023-01-22 DIAGNOSIS — I10 Essential (primary) hypertension: Secondary | ICD-10-CM | POA: Diagnosis not present

## 2023-01-22 DIAGNOSIS — E785 Hyperlipidemia, unspecified: Secondary | ICD-10-CM | POA: Diagnosis not present

## 2023-01-22 DIAGNOSIS — F411 Generalized anxiety disorder: Secondary | ICD-10-CM | POA: Diagnosis not present

## 2023-01-22 DIAGNOSIS — M797 Fibromyalgia: Secondary | ICD-10-CM

## 2023-01-22 DIAGNOSIS — E119 Type 2 diabetes mellitus without complications: Secondary | ICD-10-CM | POA: Insufficient documentation

## 2023-01-22 DIAGNOSIS — F3178 Bipolar disorder, in full remission, most recent episode mixed: Secondary | ICD-10-CM

## 2023-01-22 DIAGNOSIS — E1122 Type 2 diabetes mellitus with diabetic chronic kidney disease: Secondary | ICD-10-CM

## 2023-01-22 DIAGNOSIS — Z794 Long term (current) use of insulin: Secondary | ICD-10-CM

## 2023-01-22 DIAGNOSIS — G43109 Migraine with aura, not intractable, without status migrainosus: Secondary | ICD-10-CM

## 2023-01-22 DIAGNOSIS — T43505A Adverse effect of unspecified antipsychotics and neuroleptics, initial encounter: Secondary | ICD-10-CM

## 2023-01-22 DIAGNOSIS — G4733 Obstructive sleep apnea (adult) (pediatric): Secondary | ICD-10-CM | POA: Diagnosis not present

## 2023-01-22 DIAGNOSIS — C8330 Diffuse large B-cell lymphoma, unspecified site: Secondary | ICD-10-CM

## 2023-01-22 DIAGNOSIS — N3281 Overactive bladder: Secondary | ICD-10-CM

## 2023-01-22 DIAGNOSIS — G2111 Neuroleptic induced parkinsonism: Secondary | ICD-10-CM

## 2023-01-22 DIAGNOSIS — N182 Chronic kidney disease, stage 2 (mild): Secondary | ICD-10-CM | POA: Insufficient documentation

## 2023-01-22 DIAGNOSIS — G500 Trigeminal neuralgia: Secondary | ICD-10-CM

## 2023-01-22 DIAGNOSIS — F1129 Opioid dependence with unspecified opioid-induced disorder: Secondary | ICD-10-CM

## 2023-01-22 DIAGNOSIS — C884 Extranodal marginal zone B-cell lymphoma of mucosa-associated lymphoid tissue [MALT-lymphoma]: Secondary | ICD-10-CM

## 2023-01-22 MED ORDER — METFORMIN HCL 1000 MG PO TABS: 1000.0000 mg | ORAL_TABLET | Freq: Two times a day (BID) | ORAL | 3 refills | Status: DC

## 2023-01-22 MED ORDER — MIRABEGRON ER 50 MG PO TB24: 50.0000 mg | ORAL_TABLET | Freq: Every day | ORAL | 3 refills | Status: AC

## 2023-01-22 NOTE — Assessment & Plan Note (Signed)
Taking rosuvastatin 10 mg daily and Zetia 10 mg daily.  Continue to work on diet.

## 2023-01-22 NOTE — Assessment & Plan Note (Addendum)
Patien states that she is in remission.  She is being followed by oncology. 

## 2023-01-22 NOTE — Assessment & Plan Note (Signed)
Been using her CPAP machine occasionally, recommend increasing use

## 2023-01-22 NOTE — Assessment & Plan Note (Signed)
She is currently on oxycodone that she gets from the pain clinic.

## 2023-01-22 NOTE — Assessment & Plan Note (Signed)
Patien states that she is in remission.  She is being followed by oncology.

## 2023-01-22 NOTE — Telephone Encounter (Signed)
Prescribed by Dr. Elna Breslow

## 2023-01-22 NOTE — Assessment & Plan Note (Signed)
Continue to follow-up with neurology.  She is currently taking 2 times a day.  She is also on oxycodone.

## 2023-01-22 NOTE — Assessment & Plan Note (Signed)
Patient had liver transplant and is on Prograf.  Patient states she does need a refill on this.  Instructed patient to reach out to transplant team

## 2023-01-22 NOTE — Assessment & Plan Note (Signed)
Followed by endocrinology.  She is currently taking metformin 1000 mg twice daily NovoLog sliding scale Tresiba 30 units daily.

## 2023-01-22 NOTE — Progress Notes (Signed)
BP 116/74   Pulse 97   Temp 98.3 F (36.8 C)   Resp 16   Ht 5' 6.5" (1.689 m)   Wt 252 lb 3.2 oz (114.4 kg)   SpO2 98%   BMI 40.10 kg/m    Subjective:    Patient ID: Alison [name], female    DOB: 1976-01-10, 47 y.o.   MRN: [id number]  HPI: Alison [name] is a 47 y.o. female  Chief Complaint  Patient presents with   Follow-up   Medication Refill   Hypertension: Currently takes lisinopril 20 mg daily.  Her blood pressure today is 116/74.  Denies any blurred vision, headaches, chest pain or shortness of breath.   Diabetes: Currently under the care of Dr. Patrecia Pace for her diabetes.  Last A1c was 10.6 01/20/2023.  She reports her blood sugar runs 250-350.  Currently takes metformin 1000 mg twice daily, she is no longer on trulicity she is now on sliding scale novolog and tresiba 30 units daily.   Hyperlipidemia: Last LDL was 19 on 05/27/2022.  She currently takes rosuvastatin 10 mg daily and Zetia 10 mg daily.  She denies any myalgia.  Migraines/trigeminal neuralgia/fibromyalgia/parkinsonism: She is currently followed by neurology.  Last seen on 12/09/2022. Notes from neurology: Continue/Refill Ubrelvy - Take 50 mg at onset of headache, can repeat dose in > 2 hours if necessary. Do not exceed 100 mg in 24 hours (tolerating without side effects) she reports she has been doing well.  No changes.   Sleep apnea/ cpap: Patient stable.  Patient states she does use her CPAP machine as much as possible.    Lymphoma: Currently sees Dr. Smith Robert in oncology.  Seen by oncology on 12/03/2022. Plan: patient is a 47 y.o. 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 Clinically patient is doing well with no concerning signs and symptoms of recurrence based on today's exam.  No palpable splenomegaly or adenopathy.  She does not require any surveillance imaging at this time.  She  has mild chronic thrombocytopenia  which has remained stable overall. She says she is in remission and doing well.    Obesity:her weight today is 252 lbs with a BMI of 40.10.  Continue working on lifestyle modification including eating a well-balanced diet and increasing physical activity as tolerated.  Comorbidities includehe hypertension, diabetes, hyperlipidemia, migraines, fibromyalgia, sleep apnea, lymphoma and bipolar.  Bipolar/insomnia/anxiety: Currently sees Dr. Elna Breslow. She is currently on flexor 112.5 mg daily, Latuda 20 mg daily and she also takes Cogentin 1 mg p.o. daily.  She is on Lunesta for insomnia  Plan: Plan from Psychiatry Bipolar disorder type I depressed in remission Latuda 20 mg p.o. daily with supper Venlafaxine 112.5 mg p.o. daily Cogentin 1 mg p.o. daily as needed for side effects   GAD-stable Mirtazapine 15 mg p.o. nightly Continue CBT as needed   PTSD-stable Venlafaxine as prescribed Continue CBT   Insomnia-improving Mirtazapine 15 mg p.o. nightly Melatonin 5 to 10 mg p.o. nightly Continue CPAP Continue sleep hygiene techniques     01/22/2023   10:33 AM 12/12/2022    4:45 PM 09/12/2022    4:50 PM 08/01/2022    1:37 PM 07/08/2022   11:34 AM  Depression screen PHQ 2/9  Decreased Interest 2 1 0 1 2  Down, Depressed, Hopeless 2 1 0 0 2  PHQ - 2 Score 4 2 0 1 4  Altered sleeping 2 2  1 3   Tired, decreased energy 2 1  3 2   Change in appetite 0 1  0 3  Feeling bad or failure about yourself  1 0  0 1  Trouble concentrating 2 1  2 1   Moving slowly or fidgety/restless 0 0  0 1  Suicidal thoughts 0 0  0 0  PHQ-9 Score 11 7  7 15   Difficult doing work/chores Somewhat difficult   Not difficult at all Somewhat difficult       01/22/2023   10:39 AM 12/12/2022    4:45 PM 09/12/2022    4:50 PM 08/01/2022    1:39 PM  GAD 7 : Generalized Anxiety Score  Nervous, Anxious, on Edge 1 1 1 1   Control/stop worrying 1 0 1 0  Worry too much - different things 1 1 0 0   Trouble relaxing 1 1 0 2  Restless 1 1 0 1  Easily annoyed or irritable 2 2 0 0  Afraid - awful might happen 1 1 1  0  Total GAD 7 Score 8 7 3 4   Anxiety Difficulty Somewhat difficult Not difficult at all Not difficult at all Not difficult at all   CKD: she is followed by nephrology. Last seen on 12/30/2022. 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 Renal ultrasound with cortical thinning. Serum creatinine has improved back to baseline of 1.06 from March 2024. Continue rosuvastatin for cardiovascular risk reduction. Encouraged patient to stay hydrated and drink at least 30 to 40 ounces of water daily.  Sjogren's syndrome: Patient is aware that she has history of plenty of water at routine interval, avoid irritants like coffee and smoking and alcohol.  Lupus: she tries to stay out of the sun because this is a trigger for her. Stable no issues.   Liver transplant: she says she needs a refill for her prograf. Discussed that she needs to reach out to her transplant team.     Over active bladder: she takes myrbetric 50 mg daily.  She has been on this medication for years.  She says it helps and would like a refill. Refill sent.   Relevant past medical, surgical, family and social history reviewed and updated as indicated. Interim medical history since our last visit reviewed. Allergies and medications reviewed and updated.  Review of Systems  Constitutional: Negative for fever or weight change.  Respiratory: Negative for cough and shortness of breath.   Cardiovascular: Negative for chest pain or palpitations.  Gastrointestinal: Negative for abdominal pain, no bowel changes.  Musculoskeletal: Negative for gait problem or joint swelling.  Skin: Negative for rash.  Neurological: Negative for dizziness or headache.  No other specific complaints in a complete review of systems (except as listed in HPI above).      Objective:     BP 116/74   Pulse 97   Temp 98.3 F (36.8 C)   Resp 16   Ht 5' 6.5" (1.689 m)   Wt 252 lb 3.2 oz (114.4 kg)   SpO2 98%   BMI 40.10 kg/m   Wt Readings from Last 3 Encounters:  01/22/23 252 lb 3.2 oz (114.4 kg)  12/12/22 255 lb 6.4 oz (115.8 kg)  12/03/22 255 lb 8 oz (115.9 kg)    Physical Exam  Constitutional: Patient appears well-developed and well-nourished. Obese  No distress.  HEENT: head atraumatic, normocephalic, pupils equal and reactive to light,  neck supple Cardiovascular:  Normal rate, regular rhythm and normal heart sounds.  No murmur heard. No BLE edema. Pulmonary/Chest: Effort normal and breath sounds normal. No respiratory distress. Abdominal: Soft.  There is no tenderness. Psychiatric: Patient has a normal mood and affect. behavior is normal. Judgment and thought content normal.  Diabetic Foot Exam - Simple   Simple Foot Form Diabetic Foot exam was performed with the following findings: Yes 01/22/2023 10:56 AM  Visual Inspection No deformities, no ulcerations, no other skin breakdown bilaterally: Yes Sensation Testing Intact to touch and monofilament testing bilaterally: Yes Pulse Check Posterior Tibialis and Dorsalis pulse intact bilaterally: Yes Comments     Results for orders placed or performed during the hospital encounter of 12/12/22  Prolactin  Result Value Ref Range   Prolactin 16.4 4.8 - 33.4 ng/mL  Lactate dehydrogenase  Result Value Ref Range   LDH 152 98 - 192 U/L  Comprehensive metabolic panel  Result Value Ref Range   Sodium 133 (L) 135 - 145 mmol/L   Potassium 4.5 3.5 - 5.1 mmol/L   Chloride 98 98 - 111 mmol/L   CO2 25 22 - 32 mmol/L   Glucose, Bld 318 (H) 70 - 99 mg/dL   BUN 20 6 - 20 mg/dL   Creatinine, Ser 1.61 (H) 0.44 - 1.00 mg/dL   Calcium 9.4 8.9 - 09.6 mg/dL   Total Protein 7.5 6.5 - 8.1 g/dL   Albumin 4.1 3.5 - 5.0 g/dL   AST 48 (H) 15 - 41 U/L   ALT 32 0 - 44 U/L   Alkaline Phosphatase 72 38 - 126 U/L   Total  Bilirubin 0.8 0.3 - 1.2 mg/dL   GFR, Estimated >04 >54 mL/min   Anion gap 10 5 - 15  CBC with Differential/Platelet  Result Value Ref Range   WBC 7.5 4.0 - 10.5 K/uL   RBC 4.79 3.87 - 5.11 MIL/uL   Hemoglobin 13.7 12.0 - 15.0 g/dL   HCT 09.8 11.9 - 14.7 %   MCV 86.8 80.0 - 100.0 fL   MCH 28.6 26.0 - 34.0 pg   MCHC 32.9 30.0 - 36.0 g/dL   RDW 82.9 56.2 - 13.0 %   Platelets 146 (L) 150 - 400 K/uL   nRBC 0.0 0.0 - 0.2 %   Neutrophils Relative % 43 %   Neutro Abs 3.3 1.7 - 7.7 K/uL   Lymphocytes Relative 40 %   Lymphs Abs 3.0 0.7 - 4.0 K/uL   Monocytes Relative 8 %   Monocytes Absolute 0.6 0.1 - 1.0 K/uL   Eosinophils Relative 8 %   Eosinophils Absolute 0.6 (H) 0.0 - 0.5 K/uL   Basophils Relative 1 %   Basophils Absolute 0.1 0.0 - 0.1 K/uL   Immature Granulocytes 0 %   Abs Immature Granulocytes 0.03 0.00 - 0.07 K/uL      Assessment & Plan:   Problem List Items Addressed This Visit       Cardiovascular and Mediastinum   Essential hypertension - Primary    Blood pressure today is 116/74 continue taking lisinopril 20 mg daily.      Relevant Orders   CBC with Differential/Platelet   Lipid panel   Migraine    Continue to follow-up with neurology.  Take Ubrelvy 50 mg.        Respiratory   OSA on CPAP    Been using her CPAP machine occasionally, recommend increasing use        Endocrine   Type 2 diabetes mellitus with stage  4 chronic kidney disease, with long-term current use of insulin    Followed by endocrinology.  She is currently taking metformin 1000 mg twice daily NovoLog sliding scale Tresiba 30 units daily.      Relevant Medications   NOVOLOG FLEXPEN 100 UNIT/ML FlexPen   Insulin Degludec (TRESIBA) 100 UNIT/ML SOLN   metFORMIN (GLUCOPHAGE) 1000 MG tablet   Other Relevant Orders   COMPLETE METABOLIC PANEL WITH GFR   Microalbumin / creatinine urine ratio     Nervous and Auditory   Trigeminal neuralgia    Currently followed by neurology. She is also  taking baclofen 10 mg 2 times a day      Neuroleptic induced parkinsonism    She is currently followed by neurology.  For her migraines she is [place].  She is also taking baclofen 10 mg 2 times a day      Opioid dependence with opioid-induced disorder    She is currently on oxycodone that she gets from the pain clinic.        Genitourinary   CKD (chronic kidney disease), stage II    Currently established with nephrology.Renal ultrasound with cortical thinning. Serum creatinine has improved back to baseline of 1.06 from March 2024. Continue rosuvastatin for cardiovascular risk reduction. Encouraged patient to stay hydrated and drink at least 30 to 40 ounces of water daily.      Overactive bladder   Relevant Medications   mirabegron ER (MYRBETRIQ) 50 MG TB24 tablet     Other   Fibromyalgia    Continue to follow-up with neurology.  She is currently taking 2 times a day.  She is also on oxycodone.      Systemic lupus erythematosus (SLE) in adult    Patient reports she is doing well.  Condition is stable      Hyperlipidemia    Taking rosuvastatin 10 mg daily and Zetia 10 mg daily.  Continue to work on diet.      Relevant Orders   Lipid panel   COMPLETE METABOLIC PANEL WITH GFR   DLBCL (diffuse large B cell lymphoma)    Patien states that she is in remission.  She is being followed by oncology.      Morbid obesity      Continue working on lifestyle modification including eating a well-balanced diet and increasing physical activity as tolerated.  Comorbidities includehe hypertension, diabetes, hyperlipidemia, migraines, fibromyalgia, sleep apnea, lymphoma and bipolar.      Relevant Medications   NOVOLOG FLEXPEN 100 UNIT/ML FlexPen   Insulin Degludec (TRESIBA) 100 UNIT/ML SOLN   metFORMIN (GLUCOPHAGE) 1000 MG tablet   S/P liver transplant    Patient had liver transplant and is on Prograf.  Patient states she does need a refill on this.  Instructed patient to reach out to  transplant team      GAD (generalized anxiety disorder)    Followed By Dr. Mariam Dollar.  Patient reports she is doing well.  Continue taking Effexor 112.5 mg daily, Latuda 20 mg daily, Cogentin 1 mg daily and Lunesta for sleep.      Bipolar disorder, in full remission, most recent episode mixed    Followed By Dr. Mariam Dollar.  Patient reports she is doing well.  Continue taking Effexor 112.5 mg daily, Latuda 20 mg daily, Cogentin 1 mg daily and Lunesta for sleep.      Sjogren's syndrome    Patient reports she is still having a lot of dry mouth.  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  She is being followed by oncology.        Follow up plan: Return in about 6 months (around 07/24/2023) for follow up.

## 2023-01-22 NOTE — Assessment & Plan Note (Signed)
Continue working on lifestyle modification including eating a well-balanced diet and increasing physical activity as tolerated.  Comorbidities includehe hypertension, diabetes, hyperlipidemia, migraines, fibromyalgia, sleep apnea, lymphoma and bipolar.

## 2023-01-22 NOTE — Assessment & Plan Note (Signed)
She is currently followed by neurology.  For her migraines she is [place].  She is also taking baclofen 10 mg 2 times a day

## 2023-01-22 NOTE — Telephone Encounter (Signed)
Requested medication (s) are due for refill today: yes  Requested medication (s) are on the active medication list: yes  Last refill:  10/30/22  Future visit scheduled: yes  Notes to clinic:  Unable to refill per protocol, last refill by another provider.  Routing for approval.     Requested Prescriptions  Pending Prescriptions Disp Refills   mirtazapine (REMERON) 15 MG tablet 90 tablet 0     Psychiatry: Antidepressants - mirtazapine Failed - 01/21/2023  2:57 PM      Failed - Valid encounter within last 6 months    Recent Outpatient Visits           Today Essential hypertension   [facility] [name], FNP   8 months ago Essential hypertension   [facility] [name], FNP   12 months ago Essential hypertension   [facility] Della Goo F, FNP   1 year ago Migraine with aura and without status migrainosus, not intractable   [facility] [name], FNP   1 year ago Essential hypertension   [facility] Danelle Berry, PA-C              Passed - Completed PHQ-2 or PHQ-9 in the last 360 days

## 2023-01-22 NOTE — Assessment & Plan Note (Signed)
Patient reports she is still having a lot of dry mouth.  She is aware to increase water intake avoid irritants like coffee and smoking alcohol

## 2023-01-22 NOTE — Assessment & Plan Note (Addendum)
Currently established with nephrology.Renal ultrasound with cortical thinning. Serum creatinine has improved back to baseline of 1.06 from March 2024. Continue rosuvastatin for cardiovascular risk reduction. Encouraged patient to stay hydrated and drink at least 30 to 40 ounces of water daily.

## 2023-01-22 NOTE — Assessment & Plan Note (Addendum)
Followed By Dr. Eapen.  Patient reports she is doing well.  Continue taking Effexor 112.5 mg daily, Latuda 20 mg daily, Cogentin 1 mg daily and Lunesta for sleep. 

## 2023-01-22 NOTE — Assessment & Plan Note (Addendum)
Currently followed by neurology. She is also taking baclofen 10 mg 2 times a day

## 2023-01-22 NOTE — Assessment & Plan Note (Signed)
Patient reports she is doing well.  Condition is stable

## 2023-01-22 NOTE — Assessment & Plan Note (Signed)
Followed By Dr. Mariam Dollar.  Patient reports she is doing well.  Continue taking Effexor 112.5 mg daily, Latuda 20 mg daily, Cogentin 1 mg daily and Lunesta for sleep.

## 2023-01-22 NOTE — Assessment & Plan Note (Signed)
Continue to follow-up with neurology.  Take Ubrelvy 50 mg.

## 2023-01-22 NOTE — Assessment & Plan Note (Signed)
Blood pressure today is 116/74 continue taking lisinopril 20 mg daily.

## 2023-01-28 DIAGNOSIS — H5213 Myopia, bilateral: Secondary | ICD-10-CM | POA: Diagnosis not present

## 2023-01-31 DIAGNOSIS — F4310 Post-traumatic stress disorder, unspecified: Secondary | ICD-10-CM | POA: Diagnosis not present

## 2023-01-31 DIAGNOSIS — F411 Generalized anxiety disorder: Secondary | ICD-10-CM | POA: Diagnosis not present

## 2023-01-31 DIAGNOSIS — F3181 Bipolar II disorder: Secondary | ICD-10-CM | POA: Diagnosis not present

## 2023-01-31 DIAGNOSIS — G4733 Obstructive sleep apnea (adult) (pediatric): Secondary | ICD-10-CM | POA: Diagnosis not present

## 2023-02-04 ENCOUNTER — Other Ambulatory Visit: Payer: Self-pay | Admitting: *Deleted

## 2023-02-04 DIAGNOSIS — E1165 Type 2 diabetes mellitus with hyperglycemia: Secondary | ICD-10-CM

## 2023-02-04 MED ORDER — ROSUVASTATIN CALCIUM 10 MG PO TABS: 10.0000 mg | ORAL_TABLET | Freq: Every day | ORAL | 2 refills | Status: DC

## 2023-02-05 NOTE — Telephone Encounter (Signed)
Error

## 2023-02-10 DIAGNOSIS — I10 Essential (primary) hypertension: Secondary | ICD-10-CM | POA: Diagnosis not present

## 2023-02-10 DIAGNOSIS — E1165 Type 2 diabetes mellitus with hyperglycemia: Secondary | ICD-10-CM | POA: Diagnosis not present

## 2023-02-10 DIAGNOSIS — E7849 Other hyperlipidemia: Secondary | ICD-10-CM | POA: Diagnosis not present

## 2023-02-17 DIAGNOSIS — R52 Pain, unspecified: Secondary | ICD-10-CM | POA: Diagnosis not present

## 2023-02-17 DIAGNOSIS — E7849 Other hyperlipidemia: Secondary | ICD-10-CM | POA: Diagnosis not present

## 2023-02-17 DIAGNOSIS — M797 Fibromyalgia: Secondary | ICD-10-CM | POA: Diagnosis not present

## 2023-02-17 DIAGNOSIS — F3131 Bipolar disorder, current episode depressed, mild: Secondary | ICD-10-CM | POA: Diagnosis not present

## 2023-02-17 DIAGNOSIS — K7581 Nonalcoholic steatohepatitis (NASH): Secondary | ICD-10-CM | POA: Diagnosis not present

## 2023-02-17 DIAGNOSIS — Z6841 Body Mass Index (BMI) 40.0 and over, adult: Secondary | ICD-10-CM | POA: Diagnosis not present

## 2023-02-17 DIAGNOSIS — I10 Essential (primary) hypertension: Secondary | ICD-10-CM | POA: Diagnosis not present

## 2023-02-17 DIAGNOSIS — C8580 Other specified types of non-Hodgkin lymphoma, unspecified site: Secondary | ICD-10-CM | POA: Diagnosis not present

## 2023-02-17 DIAGNOSIS — Z944 Liver transplant status: Secondary | ICD-10-CM | POA: Diagnosis not present

## 2023-02-17 DIAGNOSIS — F411 Generalized anxiety disorder: Secondary | ICD-10-CM | POA: Diagnosis not present

## 2023-02-17 DIAGNOSIS — E669 Obesity, unspecified: Secondary | ICD-10-CM | POA: Diagnosis not present

## 2023-02-17 DIAGNOSIS — E1165 Type 2 diabetes mellitus with hyperglycemia: Secondary | ICD-10-CM | POA: Diagnosis not present

## 2023-02-19 ENCOUNTER — Other Ambulatory Visit: Payer: Medicaid Other | Admitting: Obstetrics and Gynecology

## 2023-02-19 ENCOUNTER — Encounter: Payer: Self-pay | Admitting: Nurse Practitioner

## 2023-02-19 ENCOUNTER — Encounter: Payer: Self-pay | Admitting: Obstetrics and Gynecology

## 2023-02-19 NOTE — Patient Instructions (Signed)
Hi Ms. Morath, glad you are having a good day, thanks for speaking with me!  Ms. Abby was given information about Medicaid Managed Care team care coordination services as a part of their Healthy [facility] Medicaid benefit. [name] verbally consented to engagement with the [facility] Managed Care team.   If you are experiencing a medical emergency, please call 911 or report to your local emergency department or urgent care.   If you have a non-emergency medical problem during routine business hours, please contact your provider's office and ask to speak with a nurse.   For questions related to your Healthy [facility] health plan, please call: [phone] or visit the homepage here: [email or web address]  If you would like to schedule transportation through your Healthy [facility] plan, please call the following number at least 2 days in advance of your appointment: [phone]  For information about your ride after you set it up, call Ride Assist at [phone]. Use this number to activate a Will Call pickup, or if your transportation is late for a scheduled pickup. Use this number, too, if you need to make a change or cancel a previously scheduled reservation.  If you need transportation services right away, call [phone]. The after-hours call center is staffed 24 hours to handle ride assistance and urgent reservation requests (including discharges) 365 days a year. Urgent trips include sick visits, hospital discharge requests and life-sustaining treatment.  Call the [facility] Line at [phone], at any time, 24 hours a day, 7 days a week. If you are in danger or need immediate medical attention call 911.  If you would like help to quit smoking, call 1-800-QUIT-NOW ((857) 805-6810) OR Espaol: 1-855-Djelo-Ya (8-756-433-2951) o para ms informacin haga clic aqu or Text READY to 884-166 to register via text  Ms. Mcneeley -  following are the goals we discussed in your visit today:   Goals Addressed             This Visit's Progress    Chronic Pain Managed       02/19/23:  No complaints today-feeling well.    Get sugars at goal       Timeframe:  Short-Term Goal Priority:  High Start Date:                             Expected End Date:      ongoing                 Follow Up Date: 03/25/23   - call for medicine refill 2 or 3 days before it runs out - call if I am sick and can't take my medicine - keep a list of all the medicines I take; vitamins and herbals too    Why is this important?   These steps will help you keep on track with your medicines. 02/19/23:  Blood sugars 300s per patient-medications adjusted by provider   Patient verbalizes understanding of instructions and care plan provided today and agrees to view in MyChart. Active MyChart status and patient understanding of how to access instructions and care plan via MyChart confirmed with patient.     The Managed Medicaid care management team will reach out to the patient again over the next 30 business  days.  The  Patient has been provided with contact information for the Managed Medicaid care management team and has been advised to call with any health related questions or concerns.  Kathi Der RN, [name] [facility] Care Management Coordinator - Managed Medicaid High Risk [phone]   Following is a copy of your plan of care:  Care Plan : Chronic Pain (Adult)  Updates made by [name], RN since 02/19/2023 12:00 AM     Problem: Chronic Pain Management-fibromyalgia   Priority: High  Onset Date: 01/22/2021     Long-Range Goal: Fibromyalgia pain managed-new pain management provider   Start Date: 08/14/2020  Expected End Date: 04/11/2023  Recent Progress: On track  Priority: High  Note:   Current Barriers:  Knowledge deficits related to pain management  02/19/23:  No complaints today.  Nurse Case  Manager Clinical Goal(s):  Over the next 45 days, patient will work with [facility] to address needs related to referral for pain management provider and associated care coordination needs. 05/24/22:  patient is currently seeing Dr. Welton Flakes at [facility].  Interventions:  Inter-disciplinary care team collaboration (see longitudinal plan of care) Evaluation of current treatment plan related to fibromyalgia  and patient's adherence to plan as established by provider. Update 06/15/21:  Patient taking Baclofen again, Effexor added. Collaborated with primary care provider regarding recommendations and referral to pain management provider. Discussed plans with patient for ongoing care management follow up and provided patient with direct contact information for care management team Anticipate pain education program, pain management support as part of pain management referral.       Patient Goals/Self-Care Activities Over the next 45 days, patient will:  -Attends all scheduled provider appointments  develop a personal pain management plan with your pain management provider.  Follow Up Plan:  The Managed Medicaid care management team will reach out to the patient again over the next 45 business days.    Care Plan : Wellness (Adult)  Updates made by [name], RN since 02/19/2023 12:00 AM     Problem: Medication Adherence (Wellness)   Priority: High  Onset Date: 01/22/2021     Long-Range Goal: Medication Adherence Maintained   Start Date: 09/06/2020  Expected End Date: 04/11/2023  Recent Progress: On track  Priority: High  Note:   Current Barriers:  Chronic Disease Management of chronic health conditions, OSD, migraines, DM, HTN, chronic pain, anxiety/depression/ Bipolar/PTSD, SLE, LDD, HLD, fibromyalgia, osteoarthritis, LBP, DLBCL, liver transplant, lymphoma 02/19/23:  No complaints today-blood sugars 300s-medications adjusted by provider.   Nurse Case Manager Clinical  Goal(s):  Over the next 30 days, patient will work with CM team pharmacist to review current medications. Update 01/22/21:  Patient met with Pharmacist and continues to follow. Over the next 30 days, patient will meet with Nutritionist and check her blood sugars. Over the next 30 days, patient will attend all scheduled appointments.  Interventions:  Inter-disciplinary care team collaboration (see longitudinal plan of care) Evaluation of current treatment plan and patient's adherence to plan as established by provider. Advised patient to contact her PCP for any medication needs. Reviewed medications with patient. Collaborated with pharmacy regarding medications.  Discussed plans with patient for ongoing care management follow up and provided patient with direct contact information for care management team Pharmacy referral for medication review. Patient given phone number for [facility] transportation if needed. Will notify PCP of need for testing strips and dietician referral. Collaborated with PCP for cardiology appointment. Collaborated with [name] for dental resources. [name] referral for dental resources-completed.  Hyperlipidemia Interventions:  (Status:  New goal.) Long Term Goal Medication review performed; medication list  updated in electronic medical record.  Provider established cholesterol goals reviewed Counseled on importance of regular laboratory monitoring as prescribed Reviewed importance of limiting foods high in cholesterol Reviewed exercise goals and target of 150 minutes per week Screening for signs and symptoms of depression related to chronic disease state Assessed social determinant of health barriers    Diabetes Interventions:  (Status:  New goal.) Long Term Goal Assessed patient's understanding of A1c goal: <7% Reviewed medications with patient and discussed importance of medication adherence Counseled on importance of regular laboratory monitoring  as prescribed Discussed plans with patient for ongoing care management follow up and provided patient with direct contact information for care management team Reviewed scheduled/upcoming provider appointments Advised patient, providing education and rationale, to check cbg as directed and record, calling provider for findings outside established parameters Review of patient status, including review of consultants reports, relevant laboratory and other test results, and medications completed Assessed social determinant of health barriers Lab Results  Component Value Date   HGBA1C 6.4 03/06/2022  Hyperlipidemia Interventions:  (Status:  New goal.) Long Term Goal Medication review performed; medication list updated in electronic medical record.  Provider established cholesterol goals reviewed Reviewed importance of limiting foods high in cholesterol Assessed social determinant of health barriers   Hypertension Interventions:  (Status:  New goal.) Long Term Goal Last practice recorded BP readings:  BP Readings from Last 3 Encounters:  10/21/22 (!) 142/106  09/18/22 113/85  06/21/22 104/62  12/03/22         130/81 12/12/22        104/73 01/22/23        116/74  Most recent eGFR/CrCl:  Lab Results  Component Value Date   EGFR 29 (L) 05/27/2022    No components found for: "CRCL"  Evaluation of current treatment plan related to hypertension self management and patient's adherence to plan as established by provider Reviewed medications with patient and discussed importance of compliance Discussed plans with patient for ongoing care management follow up and provided patient with direct contact information for care management team Advised patient, providing education and rationale, to monitor blood pressure daily and record, calling PCP for findings outside established parameters Reviewed scheduled/upcoming provider appointments including:  Provided education on prescribed diet Discussed  complications of poorly controlled blood pressure such as heart disease, stroke, circulatory complications, vision complications, kidney impairment, sexual dysfunction Assessed social determinant of health barriers  Pain Interventions:  (Status:  New goal.) Long Term Goal Pain assessment performed Medications reviewed Reviewed provider established plan for pain management Discussed importance of adherence to all scheduled medical appointments Counseled on the importance of reporting any/all new or changed pain symptoms or management strategies to pain management provider Advised patient to report to care team affect of pain on daily activities Discussed use of relaxation techniques and/or diversional activities to assist with pain reduction (distraction, imagery, relaxation, massage, acupressure, TENS, heat, and cold application Reviewed with patient prescribed pharmacological and nonpharmacological pain relief strategies Assessed social determinant of health barriers   Patient Goals/Self-Care Activities Over the next 30 days, patient will:  -Patient will take medications as prescribed. Calls pharmacy for medication refills Calls provider office for new concerns or questions  Follow Up Plan: The Managed Medicaid care management team will reach out to the patient again over the next 45 business days.  The patient has been provided with contact information for the Managed Medicaid care management team and has been advised to call with any health related questions or concerns.

## 2023-02-19 NOTE — Patient Outreach (Signed)
Medicaid Managed Care   Nurse Care Manager Note  02/19/2023 Name:  Alison [name] MRN:  [id number] DOB:  1976-03-05  Alison [name] is an 47 y.o. year old female who is a primary patient of Alison Salines, FNP.  The [facility] Managed Care Coordination team was consulted for assistance with:    Chronic healthcare management needs,  OSD, migraines, DM, HTN, chronic pain, anxiety/depression/ Bipolar/PTSD, SLE, LDD, HLD, fibromyalgia, osteoarthritis, LBP, DLBCL, liver transplant, lymphoma  Alison [name] was given information about Medicaid Managed Care Coordination team services today. Alison [name] Patient agreed to services and verbal consent obtained.  Engaged with patient by telephone for follow up visit in response to provider referral for case management and/or care coordination services.   Assessments/Interventions:  Review of past medical history, allergies, medications, health status, including review of consultants reports, laboratory and other test data, was performed as part of comprehensive evaluation and provision of chronic care management services.  SDOH (Social Determinants of Health) assessments and interventions performed: SDOH Interventions    Flowsheet Row Patient Outreach Telephone from 01/10/2023 in [place] POPULATION HEALTH DEPARTMENT Patient Outreach Telephone from 12/11/2022 in [place] POPULATION HEALTH DEPARTMENT Patient Outreach Telephone from 11/11/2022 in [place] POPULATION HEALTH DEPARTMENT Patient Outreach Telephone from 10/15/2022 in [place] POPULATION HEALTH DEPARTMENT Patient Outreach Telephone from 09/10/2022 in [place] POPULATION HEALTH DEPARTMENT Patient Outreach Telephone from 08/07/2022 in [facility] Care Coordination  SDOH Interventions        Food Insecurity Interventions Intervention Not Indicated -- -- -- -- Intervention Not Indicated  Housing Interventions -- Intervention Not Indicated -- -- -- --  Transportation  Interventions Intervention Not Indicated -- -- -- -- Intervention Not Indicated  Utilities Interventions -- Intervention Not Indicated -- -- -- --  Alcohol Usage Interventions -- -- Intervention Not Indicated (Score <7) -- -- --  Financial Strain Interventions -- -- -- Intervention Not Indicated -- --  Physical Activity Interventions -- -- -- Other (Comments)  [patient does not feel like exercising right now] -- --  Stress Interventions -- -- Provide Counseling  [sees Psychiatrist on regular basis] -- -- --  Social Connections Interventions -- -- -- -- Intervention Not Indicated --     Care Plan  Allergies  Allergen Reactions   Penicillin G Itching, Rash, Anaphylaxis and Palpitations   Lamictal [Lamotrigine] Rash   Carbamazepine Other (See Comments)    Medication interaction-prograf    Hydrocodone-Acetaminophen Itching   Naproxen Itching   Penicillins     Medications Reviewed Today     Reviewed by Alison Chandler, RN (Registered Nurse) on 02/19/23 at 1437  Med List Status: <None>   Medication Order Taking? Sig Documenting Provider Last Dose Status Informant  baclofen (LIORESAL) 10 MG tablet [id number] No Take 1 tablet by mouth 2 (two) times daily. [provider] Taking Active   baclofen (LIORESAL) 20 MG tablet [id number] No Take by mouth. [provider] Taking Active   benztropine (COGENTIN) 1 MG tablet [id number] No Take 1 tablet (1 mg total) by mouth daily as needed for tremors. [name], MD Taking Active   Cholecalciferol (VITAMIN D) 125 MCG (5000 UT) CAPS [id number] No Take 5,000 Units by mouth daily. [provider] Taking Active Self  clindamycin (CLEOCIN) 300 MG capsule [id number] No Take 300 mg by mouth every 6 (six) hours. [provider] Taking Active   Cranberry (RA CRANBERRY) 500 MG CAPS [id number] No Take by mouth. [provider] Taking Active  dronabinol (MARINOL) 2.5 MG capsule [id number] No Take 2.5 mg by mouth See  admin instructions. Takes every other day [provider] Taking Active Self  Dulaglutide 4.5 MG/0.5ML SOPN [id number] No Inject 4.5 mg into the skin once a week. [provider] Taking Active   ezetimibe (ZETIA) 10 MG tablet [id number] No Take 10 mg by mouth every morning. [provider] Taking Active Self  fluticasone (FLONASE) 50 MCG/ACT nasal spray [id number] No Place into the nose. [provider] Taking Active   hydrOXYzine (VISTARIL) 25 MG capsule [id number] No Take by mouth. [provider] Taking Active   icosapent Ethyl (VASCEPA) 1 g capsule [id number] No Take 2 capsules (2 g total) by mouth 2 (two) times daily. Debbe Odea, MD Taking Active Self           Med Note Clearance Coots, [name]   Tue Mar 19, 2022 10:24 AM) Taking 1 capsule twice daily per Dr. Patrecia Pace  Insulin Degludec (TRESIBA) 100 UNIT/ML SOLN [id number] No Inject 30 Units into the skin. [provider] Taking Active   Lidocaine (GNP BURN RELIEF SPRAY) 0.5 % AERO [id number] No Apply topically. [provider] Taking Active   lisinopril (ZESTRIL) 20 MG tablet [id number] No TAKE ONE TABLET BY MOUTH ONCE DAILY Alison Salines, FNP Taking Active   lurasidone (LATUDA) 20 MG TABS tablet [id number] No Take 1 tablet (20 mg total) by mouth daily with supper. [name], MD Taking Active   magnesium oxide (MAG-OX) 400 MG tablet [id number] No Take by mouth. [provider] Taking Active   Melatonin 10 MG TABS [id number] No Take 10 mg by mouth at bedtime. [provider] Taking Active Self  meloxicam (MOBIC) 15 MG tablet [id number] No Take 1 tablet (15 mg total) by mouth daily. Cuthriell, [name], PA-C Taking Active   metFORMIN (GLUCOPHAGE) 1000 MG tablet [id number]  Take 1 tablet (1,000 mg total) by mouth 2 (two) times daily with a meal. Alison Salines, FNP  Active   methocarbamol (ROBAXIN) 500 MG tablet [id number] No Take 1 tablet (500 mg total) by mouth 4  (four) times daily. Cuthriell, [name], PA-C Taking Active   mirabegron ER (MYRBETRIQ) 50 MG TB24 tablet [id number]  Take 1 tablet (50 mg total) by mouth daily. Alison Salines, FNP  Active   mirtazapine (REMERON) 15 MG tablet [id number] No TAKE ONE TABLET BY MOUTH EVERYDAY AT BEDTIME [name], MD Taking Active   naloxone [facility]) nasal spray 4 mg/0.1 mL [id number] No Place 0.4 mg into the nose once. [provider] Taking Active Self  NOVOLOG FLEXPEN 100 UNIT/ML FlexPen [id number]  Inject into the skin. Sliding scale [provider]  Active   oxyCODONE (OXYCONTIN) 10 mg 12 hr tablet [id number] No Take by mouth. [provider] Taking Active   PRECISION QID TEST test strip [id number] No  [provider] Taking Active   pregabalin (LYRICA) 200 MG capsule [id number] No Take 1 capsule (200 mg total) by mouth 2 (two) times daily. Phineas Semen, MD Taking Active   promethazine (PHENERGAN) 25 MG tablet [id number] No Take by mouth. [provider] Taking Active   rosuvastatin (CRESTOR) 10 MG tablet [id number]  Take 1 tablet (10 mg total) by mouth daily. Debbe Odea, MD  Active   SUMAtriptan (IMITREX) 100 MG tablet [id number] No Take by mouth. [provider] Taking Active   UBRELVY 50 MG TABS [id number] No Take 1 tablet by mouth daily as needed. [provider] Taking Active  venlafaxine XR (EFFEXOR XR) 37.5 MG 24 hr capsule [id number] No Take 1 capsule (37.5 mg total) by mouth daily with breakfast. Take along with 75 mg daily [name], Levin Bacon, MD Taking Active   venlafaxine XR (EFFEXOR XR) 75 MG 24 hr capsule [id number] No Take 1 capsule (75 mg total) by mouth daily with breakfast. [name], MD Taking Active   XTAMPZA ER 9 MG C12A [id number] No Take 1 capsule by mouth every 12 (twelve) hours. [provider] Taking Active   XYLIMELTS 550 MG DISK [id number] No Take 550 mg by mouth every 8 (eight) hours as needed (dry mouth).  [provider] Taking Active Self           Patient Active Problem List   Diagnosis Date Noted   CKD (chronic kidney disease), stage II 01/22/2023   Type 2 diabetes mellitus with stage 4 chronic kidney disease, with long-term current use of insulin (HCC) 01/22/2023   Overactive bladder 01/22/2023   High risk medication use 12/12/2022   Sjogren's syndrome (HCC) 05/27/2022   Menorrhagia with irregular cycle    Akathisia 10/24/2021   At risk for prolonged QT interval syndrome 09/12/2021   Prolonged Q-T interval on ECG 08/29/2021   Insomnia 07/20/2021   Bipolar disorder, in full remission, most recent episode mixed (HCC) 07/20/2021   GAD (generalized anxiety disorder) 05/09/2021   Opioid dependence with opioid-induced disorder (HCC) 12/20/2020   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   Carpal tunnel syndrome, left 07/26/2019   Cubital tunnel syndrome on left 07/26/2019   Umbilical hernia without obstruction and without gangrene 06/17/2019   Lumbar spondylosis 06/02/2019   Obesity (BMI 35.0-39.9 without comorbidity) 06/02/2019   Goals of care, counseling/discussion 05/18/2019   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   Fibromyalgia 03/23/2019   Systemic lupus erythematosus (SLE) in adult [facility]) 03/23/2019   Essential hypertension 03/23/2019   Mass of left kidney 03/23/2019   Nausea without vomiting 03/23/2019   Hyperlipidemia 03/23/2019   Osteoarthritis 03/23/2019   Trigeminal neuralgia 03/23/2019   Nephrolithiasis 03/23/2019   Migraine 03/23/2019   OSA on CPAP 03/23/2019   Fibrocystic breast 03/23/2019   Heart murmur 03/23/2019   Major depressive disorder, recurrent (HCC) 03/18/2017   Morbid obesity (HCC) 03/18/2017   Neuropathic pain 11/19/2016   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HCC) 01/13/2015   Lumbar disc disease with radiculopathy 07/26/2013   S/P liver transplant (HCC) 07/26/2013   Facial nerve disorder 06/29/2012   Low back pain  12/26/2010   Conditions to be addressed/monitored per PCP order:  OSD, migraines, DM, HTN, chronic pain, anxiety/depression/ Bipolar/PTSD, SLE, LDD, HLD, fibromyalgia, osteoarthritis, LBP, DLBCL, liver transplant, lymphoma  Care Plan : Chronic Pain (Adult)  Updates made by Alison Chandler, RN since 02/19/2023 12:00 AM     Problem: Chronic Pain Management-fibromyalgia   Priority: High  Onset Date: 01/22/2021     Long-Range Goal: Fibromyalgia pain managed-new pain management provider   Start Date: 08/14/2020  Expected End Date: 04/11/2023  Recent Progress: On track  Priority: High  Note:   Current Barriers:  Knowledge deficits related to pain management  02/19/23:  No complaints today.  Nurse Case Manager Clinical Goal(s):  Over the next 45 days, patient will work with [facility] to address needs related to referral for pain management provider and associated care coordination needs. 05/24/22:  patient is currently seeing Dr. Welton Flakes at [facility].  Interventions:  Inter-disciplinary care team collaboration (see longitudinal plan of care) Evaluation of current  treatment plan related to fibromyalgia  and patient's adherence to plan as established by provider. Update 06/15/21:  Patient taking Baclofen again, Effexor added. Collaborated with primary care provider regarding recommendations and referral to pain management provider. Discussed plans with patient for ongoing care management follow up and provided patient with direct contact information for care management team Anticipate pain education program, pain management support as part of pain management referral.       Patient Goals/Self-Care Activities Over the next 45 days, patient will:  -Attends all scheduled provider appointments  develop a personal pain management plan with your pain management provider.  Follow Up Plan:  The Managed Medicaid care management team will reach out to the patient again over the next 45  business days.    Care Plan : Wellness (Adult)  Updates made by Alison Chandler, RN since 02/19/2023 12:00 AM     Problem: Medication Adherence (Wellness)   Priority: High  Onset Date: 01/22/2021     Long-Range Goal: Medication Adherence Maintained   Start Date: 09/06/2020  Expected End Date: 04/11/2023  Recent Progress: On track  Priority: High  Note:   Current Barriers:  Chronic Disease Management of chronic health conditions, OSD, migraines, DM, HTN, chronic pain, anxiety/depression/ Bipolar/PTSD, SLE, LDD, HLD, fibromyalgia, osteoarthritis, LBP, DLBCL, liver transplant, lymphoma 02/19/23:  No complaints today-blood sugars 300s-medications adjusted by provider.   Nurse Case Manager Clinical Goal(s):  Over the next 30 days, patient will work with CM team pharmacist to review current medications. Update 01/22/21:  Patient met with Pharmacist and continues to follow. Over the next 30 days, patient will meet with Nutritionist and check her blood sugars. Over the next 30 days, patient will attend all scheduled appointments.  Interventions:  Inter-disciplinary care team collaboration (see longitudinal plan of care) Evaluation of current treatment plan and patient's adherence to plan as established by provider. Advised patient to contact her PCP for any medication needs. Reviewed medications with patient. Collaborated with pharmacy regarding medications.  Discussed plans with patient for ongoing care management follow up and provided patient with direct contact information for care management team Pharmacy referral for medication review. Patient given phone number for [facility] transportation if needed. Will notify PCP of need for testing strips and dietician referral. Collaborated with PCP for cardiology appointment. Collaborated with [name] for dental resources. [name] referral for dental resources-completed.  Hyperlipidemia Interventions:  (Status:  New goal.) Long Term  Goal Medication review performed; medication list updated in electronic medical record.  Provider established cholesterol goals reviewed Counseled on importance of regular laboratory monitoring as prescribed Reviewed importance of limiting foods high in cholesterol Reviewed exercise goals and target of 150 minutes per week Screening for signs and symptoms of depression related to chronic disease state Assessed social determinant of health barriers    Diabetes Interventions:  (Status:  New goal.) Long Term Goal Assessed patient's understanding of A1c goal: <7% Reviewed medications with patient and discussed importance of medication adherence Counseled on importance of regular laboratory monitoring as prescribed Discussed plans with patient for ongoing care management follow up and provided patient with direct contact information for care management team Reviewed scheduled/upcoming provider appointments Advised patient, providing education and rationale, to check cbg as directed and record, calling provider for findings outside established parameters Review of patient status, including review of consultants reports, relevant laboratory and other test results, and medications completed Assessed social determinant of health barriers Lab Results  Component Value Date   HGBA1C 6.4 03/06/2022  Hyperlipidemia Interventions:  (Status:  New goal.) Long Term Goal Medication review performed; medication list updated in electronic medical record.  Provider established cholesterol goals reviewed Reviewed importance of limiting foods high in cholesterol Assessed social determinant of health barriers   Hypertension Interventions:  (Status:  New goal.) Long Term Goal Last practice recorded BP readings:  BP Readings from Last 3 Encounters:  10/21/22 (!) 142/106  09/18/22 113/85  06/21/22 104/62  12/03/22         130/81 12/12/22        104/73 01/22/23        116/74  Most recent eGFR/CrCl:  Lab  Results  Component Value Date   EGFR 29 (L) 05/27/2022    No components found for: "CRCL"  Evaluation of current treatment plan related to hypertension self management and patient's adherence to plan as established by provider Reviewed medications with patient and discussed importance of compliance Discussed plans with patient for ongoing care management follow up and provided patient with direct contact information for care management team Advised patient, providing education and rationale, to monitor blood pressure daily and record, calling PCP for findings outside established parameters Reviewed scheduled/upcoming provider appointments including:  Provided education on prescribed diet Discussed complications of poorly controlled blood pressure such as heart disease, stroke, circulatory complications, vision complications, kidney impairment, sexual dysfunction Assessed social determinant of health barriers  Pain Interventions:  (Status:  New goal.) Long Term Goal Pain assessment performed Medications reviewed Reviewed provider established plan for pain management Discussed importance of adherence to all scheduled medical appointments Counseled on the importance of reporting any/all new or changed pain symptoms or management strategies to pain management provider Advised patient to report to care team affect of pain on daily activities Discussed use of relaxation techniques and/or diversional activities to assist with pain reduction (distraction, imagery, relaxation, massage, acupressure, TENS, heat, and cold application Reviewed with patient prescribed pharmacological and nonpharmacological pain relief strategies Assessed social determinant of health barriers   Patient Goals/Self-Care Activities Over the next 30 days, patient will:  -Patient will take medications as prescribed. Calls pharmacy for medication refills Calls provider office for new concerns or questions  Follow Up Plan:  The Managed Medicaid care management team will reach out to the patient again over the next 45 business days.  The patient has been provided with contact information for the Managed Medicaid care management team and has been advised to call with any health related questions or concerns.    Follow Up:  Patient agrees to Care Plan and Follow-up.  Plan: The Managed Medicaid care management team will reach out to the patient again over the next 30 business  days. and The  Patient has been provided with contact information for the Managed Medicaid care management team and has been advised to call with any health related questions or concerns.  Date/time of next scheduled RN care management/care coordination outreach: 03/25/23 at 315

## 2023-03-03 DIAGNOSIS — M79669 Pain in unspecified lower leg: Secondary | ICD-10-CM | POA: Diagnosis not present

## 2023-03-03 DIAGNOSIS — G893 Neoplasm related pain (acute) (chronic): Secondary | ICD-10-CM | POA: Diagnosis not present

## 2023-03-03 DIAGNOSIS — M79606 Pain in leg, unspecified: Secondary | ICD-10-CM | POA: Diagnosis not present

## 2023-03-03 DIAGNOSIS — F3189 Other bipolar disorder: Secondary | ICD-10-CM | POA: Diagnosis not present

## 2023-03-03 DIAGNOSIS — M5442 Lumbago with sciatica, left side: Secondary | ICD-10-CM | POA: Diagnosis not present

## 2023-03-03 DIAGNOSIS — M79603 Pain in arm, unspecified: Secondary | ICD-10-CM | POA: Diagnosis not present

## 2023-03-03 DIAGNOSIS — Z79891 Long term (current) use of opiate analgesic: Secondary | ICD-10-CM | POA: Diagnosis not present

## 2023-03-03 DIAGNOSIS — G894 Chronic pain syndrome: Secondary | ICD-10-CM | POA: Diagnosis not present

## 2023-03-03 DIAGNOSIS — M25519 Pain in unspecified shoulder: Secondary | ICD-10-CM | POA: Diagnosis not present

## 2023-03-03 DIAGNOSIS — M5414 Radiculopathy, thoracic region: Secondary | ICD-10-CM | POA: Diagnosis not present

## 2023-03-03 DIAGNOSIS — I10 Essential (primary) hypertension: Secondary | ICD-10-CM | POA: Diagnosis not present

## 2023-03-04 ENCOUNTER — Inpatient Hospital Stay: Payer: Medicaid Other | Attending: Oncology

## 2023-03-04 DIAGNOSIS — Z95828 Presence of other vascular implants and grafts: Secondary | ICD-10-CM

## 2023-03-04 DIAGNOSIS — C884 Extranodal marginal zone B-cell lymphoma of mucosa-associated lymphoid tissue [MALT-lymphoma]: Secondary | ICD-10-CM | POA: Diagnosis not present

## 2023-03-04 MED ORDER — HEPARIN SOD (PORK) LOCK FLUSH 100 UNIT/ML IV SOLN
500.0000 [IU] | Freq: Once | INTRAVENOUS | Status: AC
  Administered 2023-03-04: 500 [IU] via INTRAVENOUS
  Filled 2023-03-04: qty 5

## 2023-03-04 MED ORDER — SODIUM CHLORIDE 0.9% FLUSH
10.0000 mL | Freq: Once | INTRAVENOUS | Status: AC
  Administered 2023-03-04: 10 mL via INTRAVENOUS
  Filled 2023-03-04: qty 10

## 2023-03-04 MED ORDER — SODIUM CHLORIDE 0.9% FLUSH
10.0000 mL | INTRAVENOUS | Status: DC | PRN
  Filled 2023-03-04: qty 10

## 2023-03-04 MED ORDER — HEPARIN SOD (PORK) LOCK FLUSH 100 UNIT/ML IV SOLN
500.0000 [IU] | Freq: Once | INTRAVENOUS | Status: DC
  Filled 2023-03-04: qty 5

## 2023-03-06 ENCOUNTER — Other Ambulatory Visit: Payer: Self-pay

## 2023-03-06 ENCOUNTER — Emergency Department: Payer: Medicaid Other

## 2023-03-06 ENCOUNTER — Inpatient Hospital Stay
Admission: EM | Admit: 2023-03-06 | Discharge: 2023-03-11 | DRG: 314 | Disposition: A | Discharge status: Home / Self Care | Payer: Medicaid Other | Attending: Internal Medicine | Admitting: Internal Medicine

## 2023-03-06 ENCOUNTER — Encounter: Payer: Self-pay | Admitting: Radiology

## 2023-03-06 DIAGNOSIS — K7460 Unspecified cirrhosis of liver: Secondary | ICD-10-CM | POA: Diagnosis not present

## 2023-03-06 DIAGNOSIS — Z79899 Other long term (current) drug therapy: Secondary | ICD-10-CM | POA: Diagnosis not present

## 2023-03-06 DIAGNOSIS — L270 Generalized skin eruption due to drugs and medicaments taken internally: Secondary | ICD-10-CM | POA: Diagnosis not present

## 2023-03-06 DIAGNOSIS — R6511 Systemic inflammatory response syndrome (SIRS) of non-infectious origin with acute organ dysfunction: Secondary | ICD-10-CM | POA: Diagnosis present

## 2023-03-06 DIAGNOSIS — Z7960 Long term (current) use of unspecified immunomodulators and immunosuppressants: Secondary | ICD-10-CM

## 2023-03-06 DIAGNOSIS — C8580 Other specified types of non-Hodgkin lymphoma, unspecified site: Secondary | ICD-10-CM | POA: Diagnosis present

## 2023-03-06 DIAGNOSIS — A419 Sepsis, unspecified organism: Secondary | ICD-10-CM | POA: Diagnosis present

## 2023-03-06 DIAGNOSIS — Z794 Long term (current) use of insulin: Secondary | ICD-10-CM

## 2023-03-06 DIAGNOSIS — N281 Cyst of kidney, acquired: Secondary | ICD-10-CM | POA: Diagnosis not present

## 2023-03-06 DIAGNOSIS — Z9049 Acquired absence of other specified parts of digestive tract: Secondary | ICD-10-CM

## 2023-03-06 DIAGNOSIS — I309 Acute pericarditis, unspecified: Secondary | ICD-10-CM | POA: Diagnosis not present

## 2023-03-06 DIAGNOSIS — G5622 Lesion of ulnar nerve, left upper limb: Secondary | ICD-10-CM | POA: Diagnosis not present

## 2023-03-06 DIAGNOSIS — N183 Chronic kidney disease, stage 3 unspecified: Secondary | ICD-10-CM | POA: Diagnosis not present

## 2023-03-06 DIAGNOSIS — Z1152 Encounter for screening for COVID-19: Secondary | ICD-10-CM | POA: Diagnosis not present

## 2023-03-06 DIAGNOSIS — R000 Tachycardia, unspecified: Secondary | ICD-10-CM | POA: Diagnosis not present

## 2023-03-06 DIAGNOSIS — Z807 Family history of other malignant neoplasms of lymphoid, hematopoietic and related tissues: Secondary | ICD-10-CM

## 2023-03-06 DIAGNOSIS — I10 Essential (primary) hypertension: Secondary | ICD-10-CM | POA: Diagnosis not present

## 2023-03-06 DIAGNOSIS — F3178 Bipolar disorder, in full remission, most recent episode mixed: Secondary | ICD-10-CM | POA: Diagnosis present

## 2023-03-06 DIAGNOSIS — Z944 Liver transplant status: Secondary | ICD-10-CM

## 2023-03-06 DIAGNOSIS — Z818 Family history of other mental and behavioral disorders: Secondary | ICD-10-CM

## 2023-03-06 DIAGNOSIS — E1142 Type 2 diabetes mellitus with diabetic polyneuropathy: Secondary | ICD-10-CM | POA: Diagnosis not present

## 2023-03-06 DIAGNOSIS — Z79891 Long term (current) use of opiate analgesic: Secondary | ICD-10-CM

## 2023-03-06 DIAGNOSIS — I300 Acute nonspecific idiopathic pericarditis: Secondary | ICD-10-CM | POA: Diagnosis not present

## 2023-03-06 DIAGNOSIS — R9431 Abnormal electrocardiogram [ECG] [EKG]: Secondary | ICD-10-CM | POA: Diagnosis present

## 2023-03-06 DIAGNOSIS — F339 Major depressive disorder, recurrent, unspecified: Secondary | ICD-10-CM | POA: Diagnosis present

## 2023-03-06 DIAGNOSIS — Z6841 Body Mass Index (BMI) 40.0 and over, adult: Secondary | ICD-10-CM | POA: Diagnosis not present

## 2023-03-06 DIAGNOSIS — Z8572 Personal history of non-Hodgkin lymphomas: Secondary | ICD-10-CM

## 2023-03-06 DIAGNOSIS — M3500 Sicca syndrome, unspecified: Secondary | ICD-10-CM | POA: Diagnosis present

## 2023-03-06 DIAGNOSIS — G4700 Insomnia, unspecified: Secondary | ICD-10-CM | POA: Diagnosis present

## 2023-03-06 DIAGNOSIS — R011 Cardiac murmur, unspecified: Secondary | ICD-10-CM | POA: Diagnosis present

## 2023-03-06 DIAGNOSIS — E1122 Type 2 diabetes mellitus with diabetic chronic kidney disease: Secondary | ICD-10-CM | POA: Diagnosis not present

## 2023-03-06 DIAGNOSIS — Z886 Allergy status to analgesic agent status: Secondary | ICD-10-CM

## 2023-03-06 DIAGNOSIS — I129 Hypertensive chronic kidney disease with stage 1 through stage 4 chronic kidney disease, or unspecified chronic kidney disease: Secondary | ICD-10-CM | POA: Diagnosis present

## 2023-03-06 DIAGNOSIS — R079 Chest pain, unspecified: Secondary | ICD-10-CM | POA: Diagnosis not present

## 2023-03-06 DIAGNOSIS — D84821 Immunodeficiency due to drugs: Secondary | ICD-10-CM | POA: Diagnosis present

## 2023-03-06 DIAGNOSIS — Z833 Family history of diabetes mellitus: Secondary | ICD-10-CM

## 2023-03-06 DIAGNOSIS — E782 Mixed hyperlipidemia: Secondary | ICD-10-CM | POA: Diagnosis not present

## 2023-03-06 DIAGNOSIS — Z888 Allergy status to other drugs, medicaments and biological substances status: Secondary | ICD-10-CM

## 2023-03-06 DIAGNOSIS — G43909 Migraine, unspecified, not intractable, without status migrainosus: Secondary | ICD-10-CM | POA: Diagnosis present

## 2023-03-06 DIAGNOSIS — E785 Hyperlipidemia, unspecified: Secondary | ICD-10-CM | POA: Diagnosis not present

## 2023-03-06 DIAGNOSIS — Z885 Allergy status to narcotic agent status: Secondary | ICD-10-CM

## 2023-03-06 DIAGNOSIS — Z7984 Long term (current) use of oral hypoglycemic drugs: Secondary | ICD-10-CM | POA: Diagnosis not present

## 2023-03-06 DIAGNOSIS — Z880 Allergy status to penicillin: Secondary | ICD-10-CM

## 2023-03-06 DIAGNOSIS — M797 Fibromyalgia: Secondary | ICD-10-CM | POA: Diagnosis present

## 2023-03-06 DIAGNOSIS — C8330 Diffuse large B-cell lymphoma, unspecified site: Secondary | ICD-10-CM | POA: Diagnosis present

## 2023-03-06 DIAGNOSIS — Z79621 Long term (current) use of calcineurin inhibitor: Secondary | ICD-10-CM

## 2023-03-06 DIAGNOSIS — R0789 Other chest pain: Secondary | ICD-10-CM | POA: Diagnosis not present

## 2023-03-06 DIAGNOSIS — I4581 Long QT syndrome: Secondary | ICD-10-CM | POA: Diagnosis present

## 2023-03-06 DIAGNOSIS — E119 Type 2 diabetes mellitus without complications: Secondary | ICD-10-CM

## 2023-03-06 DIAGNOSIS — G8929 Other chronic pain: Secondary | ICD-10-CM | POA: Diagnosis present

## 2023-03-06 DIAGNOSIS — K219 Gastro-esophageal reflux disease without esophagitis: Secondary | ICD-10-CM | POA: Diagnosis present

## 2023-03-06 DIAGNOSIS — Z881 Allergy status to other antibiotic agents status: Secondary | ICD-10-CM

## 2023-03-06 DIAGNOSIS — K754 Autoimmune hepatitis: Secondary | ICD-10-CM | POA: Diagnosis not present

## 2023-03-06 DIAGNOSIS — I301 Infective pericarditis: Secondary | ICD-10-CM | POA: Diagnosis not present

## 2023-03-06 DIAGNOSIS — R072 Precordial pain: Secondary | ICD-10-CM | POA: Diagnosis not present

## 2023-03-06 DIAGNOSIS — M1990 Unspecified osteoarthritis, unspecified site: Secondary | ICD-10-CM | POA: Diagnosis present

## 2023-03-06 DIAGNOSIS — I4510 Unspecified right bundle-branch block: Secondary | ICD-10-CM | POA: Diagnosis present

## 2023-03-06 DIAGNOSIS — Z9221 Personal history of antineoplastic chemotherapy: Secondary | ICD-10-CM

## 2023-03-06 DIAGNOSIS — Z7985 Long-term (current) use of injectable non-insulin antidiabetic drugs: Secondary | ICD-10-CM

## 2023-03-06 DIAGNOSIS — F609 Personality disorder, unspecified: Secondary | ICD-10-CM | POA: Diagnosis present

## 2023-03-06 DIAGNOSIS — Z87442 Personal history of urinary calculi: Secondary | ICD-10-CM

## 2023-03-06 DIAGNOSIS — G4733 Obstructive sleep apnea (adult) (pediatric): Secondary | ICD-10-CM | POA: Diagnosis present

## 2023-03-06 DIAGNOSIS — T368X5A Adverse effect of other systemic antibiotics, initial encounter: Secondary | ICD-10-CM | POA: Diagnosis not present

## 2023-03-06 DIAGNOSIS — F419 Anxiety disorder, unspecified: Secondary | ICD-10-CM | POA: Diagnosis present

## 2023-03-06 DIAGNOSIS — I3139 Other pericardial effusion (noninflammatory): Secondary | ICD-10-CM | POA: Diagnosis not present

## 2023-03-06 DIAGNOSIS — Z8249 Family history of ischemic heart disease and other diseases of the circulatory system: Secondary | ICD-10-CM

## 2023-03-06 LAB — URINALYSIS, ROUTINE W REFLEX MICROSCOPIC
Bacteria, UA: NONE SEEN
Bilirubin Urine: NEGATIVE
Glucose, UA: NEGATIVE mg/dL
Hgb urine dipstick: NEGATIVE
Ketones, ur: NEGATIVE mg/dL
Nitrite: NEGATIVE
Protein, ur: 100 mg/dL — AB
Specific Gravity, Urine: 1.015 (ref 1.005–1.030)
pH: 5 (ref 5.0–8.0)

## 2023-03-06 LAB — COMPREHENSIVE METABOLIC PANEL
ALT: 25 U/L (ref 0–44)
AST: 34 U/L (ref 15–41)
Albumin: 4.3 g/dL (ref 3.5–5.0)
Alkaline Phosphatase: 67 U/L (ref 38–126)
Anion gap: 11 (ref 5–15)
BUN: 19 mg/dL (ref 6–20)
CO2: 22 mmol/L (ref 22–32)
Calcium: 9.4 mg/dL (ref 8.9–10.3)
Chloride: 103 mmol/L (ref 98–111)
Creatinine, Ser: 1.05 mg/dL — ABNORMAL HIGH (ref 0.44–1.00)
GFR, Estimated: >60 mL/min (ref >60)
Glucose, Bld: 178 mg/dL — ABNORMAL HIGH (ref 70–99)
Potassium: 4.9 mmol/L (ref 3.5–5.1)
Sodium: 136 mmol/L (ref 135–145)
Total Bilirubin: 1.2 mg/dL (ref 0.3–1.2)
Total Protein: 7.8 g/dL (ref 6.5–8.1)

## 2023-03-06 LAB — CBC
HCT: 43 % (ref 36.0–46.0)
Hemoglobin: 14.4 g/dL (ref 12.0–15.0)
MCH: 29.1 pg (ref 26.0–34.0)
MCHC: 33.5 g/dL (ref 30.0–36.0)
MCV: 87 fL (ref 80.0–100.0)
Platelets: 148 10*3/uL — ABNORMAL LOW (ref 150–400)
RBC: 4.94 MIL/uL (ref 3.87–5.11)
RDW: 14.4 % (ref 11.5–15.5)
WBC: 11.5 10*3/uL — ABNORMAL HIGH (ref 4.0–10.5)
nRBC: 0 % (ref 0.0–0.2)

## 2023-03-06 LAB — MAGNESIUM: Magnesium: 1.2 mg/dL — ABNORMAL LOW (ref 1.7–2.4)

## 2023-03-06 LAB — POC URINE PREG, ED: Preg Test, Ur: NEGATIVE

## 2023-03-06 LAB — PROTIME-INR
INR: 1 (ref 0.8–1.2)
Prothrombin Time: 13.7 seconds (ref 11.4–15.2)

## 2023-03-06 LAB — TROPONIN I (HIGH SENSITIVITY)
Troponin I (High Sensitivity): 6 ng/L (ref <18)
Troponin I (High Sensitivity): 6 ng/L (ref <18)

## 2023-03-06 LAB — SARS CORONAVIRUS 2 BY RT PCR: SARS Coronavirus 2 by RT PCR: NEGATIVE

## 2023-03-06 LAB — LACTIC ACID, PLASMA
Lactic Acid, Venous: 2.3 mmol/L (ref 0.5–1.9)
Lactic Acid, Venous: 1.9 mmol/L (ref 0.5–1.9)

## 2023-03-06 LAB — APTT: aPTT: 32 seconds (ref 24–36)

## 2023-03-06 LAB — PROCALCITONIN: Procalcitonin: <0.1 ng/mL

## 2023-03-06 LAB — GLUCOSE, CAPILLARY: Glucose-Capillary: 193 mg/dL — ABNORMAL HIGH (ref 70–99)

## 2023-03-06 MED ORDER — BENZTROPINE MESYLATE 1 MG PO TABS
1.0000 mg | ORAL_TABLET | Freq: Every day | ORAL | Status: DC | PRN
  Administered 2023-03-11: 1 mg via ORAL
  Filled 2023-03-06: qty 1

## 2023-03-06 MED ORDER — SODIUM CHLORIDE 0.9% FLUSH
10.0000 mL | Freq: Two times a day (BID) | INTRAVENOUS | Status: DC
  Administered 2023-03-06 – 2023-03-11 (×9): 10 mL

## 2023-03-06 MED ORDER — INSULIN ASPART 100 UNIT/ML IJ SOLN
0.0000 [IU] | Freq: Three times a day (TID) | INTRAMUSCULAR | Status: DC
  Administered 2023-03-07: 8 [IU] via SUBCUTANEOUS
  Administered 2023-03-07: 5 [IU] via SUBCUTANEOUS
  Administered 2023-03-07: 3 [IU] via SUBCUTANEOUS
  Administered 2023-03-08: 2 [IU] via SUBCUTANEOUS
  Administered 2023-03-08 – 2023-03-09 (×4): 5 [IU] via SUBCUTANEOUS
  Administered 2023-03-09 – 2023-03-10 (×3): 3 [IU] via SUBCUTANEOUS
  Administered 2023-03-10: 8 [IU] via SUBCUTANEOUS
  Administered 2023-03-11: 2 [IU] via SUBCUTANEOUS
  Administered 2023-03-11: 3 [IU] via SUBCUTANEOUS
  Filled 2023-03-06 (×14): qty 1

## 2023-03-06 MED ORDER — ACETAMINOPHEN 650 MG RE SUPP: 650.0000 mg | Freq: Four times a day (QID) | RECTAL | Status: DC | PRN

## 2023-03-06 MED ORDER — LURASIDONE HCL 20 MG PO TABS
20.0000 mg | ORAL_TABLET | Freq: Every day | ORAL | Status: DC
  Administered 2023-03-08 – 2023-03-10 (×3): 20 mg via ORAL
  Filled 2023-03-06 (×5): qty 1

## 2023-03-06 MED ORDER — LACTATED RINGERS IV SOLN
150.0000 mL/h | INTRAVENOUS | Status: AC
  Administered 2023-03-06 – 2023-03-07 (×2): 150 mL/h via INTRAVENOUS

## 2023-03-06 MED ORDER — SODIUM CHLORIDE 0.9% FLUSH: 10.0000 mL | INTRAVENOUS | Status: DC | PRN

## 2023-03-06 MED ORDER — VANCOMYCIN HCL 1500 MG/300ML IV SOLN: 1500.0000 mg | INTRAVENOUS | Status: DC

## 2023-03-06 MED ORDER — DIPHENHYDRAMINE HCL 50 MG/ML IJ SOLN: 50.0000 mg | Freq: Four times a day (QID) | INTRAMUSCULAR | Status: DC | PRN

## 2023-03-06 MED ORDER — MELATONIN 5 MG PO TABS
10.0000 mg | ORAL_TABLET | Freq: Every day | ORAL | Status: DC
  Administered 2023-03-06 – 2023-03-10 (×5): 10 mg via ORAL
  Filled 2023-03-06 (×5): qty 2

## 2023-03-06 MED ORDER — INSULIN ASPART 100 UNIT/ML IJ SOLN
0.0000 [IU] | Freq: Every day | INTRAMUSCULAR | Status: DC
  Administered 2023-03-07: 3 [IU] via SUBCUTANEOUS
  Filled 2023-03-06: qty 1

## 2023-03-06 MED ORDER — BACLOFEN 10 MG PO TABS
10.0000 mg | ORAL_TABLET | Freq: Two times a day (BID) | ORAL | Status: DC
  Administered 2023-03-06 – 2023-03-11 (×10): 10 mg via ORAL
  Filled 2023-03-06 (×10): qty 1

## 2023-03-06 MED ORDER — MELATONIN 5 MG PO TABS: 5.0000 mg | ORAL_TABLET | Freq: Every evening | ORAL | Status: DC | PRN

## 2023-03-06 MED ORDER — UBROGEPANT 50 MG PO TABS: 1.0000 | ORAL_TABLET | Freq: Every day | ORAL | Status: DC | PRN

## 2023-03-06 MED ORDER — VANCOMYCIN HCL 500 MG/100ML IV SOLN
500.0000 mg | Freq: Once | INTRAVENOUS | Status: AC
  Administered 2023-03-06: 500 mg via INTRAVENOUS
  Filled 2023-03-06: qty 100

## 2023-03-06 MED ORDER — ENOXAPARIN SODIUM 60 MG/0.6ML IJ SOSY
55.0000 mg | PREFILLED_SYRINGE | INTRAMUSCULAR | Status: DC
  Administered 2023-03-06 – 2023-03-10 (×5): 55 mg via SUBCUTANEOUS
  Filled 2023-03-06 (×5): qty 0.6

## 2023-03-06 MED ORDER — MORPHINE SULFATE (PF) 4 MG/ML IV SOLN
4.0000 mg | INTRAVENOUS | Status: DC | PRN
  Administered 2023-03-06 (×3): 4 mg via INTRAVENOUS
  Filled 2023-03-06 (×3): qty 1

## 2023-03-06 MED ORDER — MIRTAZAPINE 15 MG PO TABS: 15.0000 mg | ORAL_TABLET | Freq: Every day | ORAL | Status: DC

## 2023-03-06 MED ORDER — PREGABALIN 75 MG PO CAPS
200.0000 mg | ORAL_CAPSULE | Freq: Two times a day (BID) | ORAL | Status: DC
  Administered 2023-03-06 – 2023-03-11 (×10): 200 mg via ORAL
  Filled 2023-03-06 (×10): qty 1

## 2023-03-06 MED ORDER — MIRABEGRON ER 50 MG PO TB24
50.0000 mg | ORAL_TABLET | Freq: Every day | ORAL | Status: DC
  Administered 2023-03-07 – 2023-03-11 (×5): 50 mg via ORAL
  Filled 2023-03-06 (×6): qty 1

## 2023-03-06 MED ORDER — VANCOMYCIN HCL IN DEXTROSE 1-5 GM/200ML-% IV SOLN: 1000.0000 mg | Freq: Once | INTRAVENOUS | Status: DC

## 2023-03-06 MED ORDER — OXYCODONE HCL ER 10 MG PO T12A
10.0000 mg | EXTENDED_RELEASE_TABLET | Freq: Two times a day (BID) | ORAL | Status: DC
  Administered 2023-03-06 – 2023-03-11 (×10): 10 mg via ORAL
  Filled 2023-03-06 (×10): qty 1

## 2023-03-06 MED ORDER — ONDANSETRON HCL 4 MG PO TABS
4.0000 mg | ORAL_TABLET | Freq: Four times a day (QID) | ORAL | Status: DC | PRN
  Administered 2023-03-08: 4 mg via ORAL
  Filled 2023-03-06: qty 1

## 2023-03-06 MED ORDER — VANCOMYCIN HCL 2000 MG/400ML IV SOLN
2000.0000 mg | Freq: Once | INTRAVENOUS | Status: AC
  Administered 2023-03-06: 2000 mg via INTRAVENOUS
  Filled 2023-03-06: qty 400

## 2023-03-06 MED ORDER — EZETIMIBE 10 MG PO TABS
10.0000 mg | ORAL_TABLET | Freq: Every day | ORAL | Status: DC
  Administered 2023-03-07 – 2023-03-11 (×5): 10 mg via ORAL
  Filled 2023-03-06 (×5): qty 1

## 2023-03-06 MED ORDER — INSULIN GLARGINE-YFGN 100 UNIT/ML ~~LOC~~ SOLN
40.0000 [IU] | Freq: Every day | SUBCUTANEOUS | Status: DC
  Administered 2023-03-06 – 2023-03-10 (×5): 40 [IU] via SUBCUTANEOUS
  Filled 2023-03-06 (×7): qty 0.4

## 2023-03-06 MED ORDER — ICOSAPENT ETHYL 1 G PO CAPS
2.0000 g | ORAL_CAPSULE | Freq: Two times a day (BID) | ORAL | Status: DC
  Administered 2023-03-06 – 2023-03-11 (×10): 2 g via ORAL
  Filled 2023-03-06 (×10): qty 2

## 2023-03-06 MED ORDER — LINEZOLID 600 MG/300ML IV SOLN
600.0000 mg | Freq: Two times a day (BID) | INTRAVENOUS | Status: DC
  Administered 2023-03-06 – 2023-03-10 (×8): 600 mg via INTRAVENOUS
  Filled 2023-03-06 (×8): qty 300

## 2023-03-06 MED ORDER — VENLAFAXINE HCL ER 37.5 MG PO CP24
37.5000 mg | ORAL_CAPSULE | Freq: Every day | ORAL | Status: DC
  Administered 2023-03-07 – 2023-03-11 (×5): 37.5 mg via ORAL
  Filled 2023-03-06 (×5): qty 1

## 2023-03-06 MED ORDER — ACETAMINOPHEN 325 MG PO TABS
650.0000 mg | ORAL_TABLET | Freq: Four times a day (QID) | ORAL | Status: DC | PRN
  Administered 2023-03-07 – 2023-03-10 (×2): 650 mg via ORAL
  Filled 2023-03-06 (×3): qty 2

## 2023-03-06 MED ORDER — DIPHENHYDRAMINE HCL 50 MG/ML IJ SOLN
50.0000 mg | Freq: Four times a day (QID) | INTRAMUSCULAR | Status: AC | PRN
  Administered 2023-03-06: 50 mg via INTRAVENOUS
  Filled 2023-03-06: qty 1

## 2023-03-06 MED ORDER — IBUPROFEN 400 MG PO TABS
400.0000 mg | ORAL_TABLET | Freq: Four times a day (QID) | ORAL | Status: AC | PRN
  Administered 2023-03-06: 400 mg via ORAL
  Filled 2023-03-06: qty 1

## 2023-03-06 MED ORDER — IOHEXOL 300 MG/ML  SOLN
100.0000 mL | Freq: Once | INTRAMUSCULAR | Status: AC | PRN
  Administered 2023-03-06: 100 mL via INTRAVENOUS

## 2023-03-06 MED ORDER — METRONIDAZOLE 500 MG/100ML IV SOLN
500.0000 mg | Freq: Once | INTRAVENOUS | Status: AC
  Administered 2023-03-06: 500 mg via INTRAVENOUS
  Filled 2023-03-06: qty 100

## 2023-03-06 MED ORDER — MORPHINE SULFATE (PF) 4 MG/ML IV SOLN
4.0000 mg | INTRAVENOUS | Status: AC | PRN
  Administered 2023-03-07 (×2): 4 mg via INTRAVENOUS
  Filled 2023-03-06 (×2): qty 1

## 2023-03-06 MED ORDER — METRONIDAZOLE 500 MG/100ML IV SOLN
500.0000 mg | Freq: Two times a day (BID) | INTRAVENOUS | Status: DC
  Administered 2023-03-07 – 2023-03-10 (×8): 500 mg via INTRAVENOUS
  Filled 2023-03-06 (×8): qty 100

## 2023-03-06 MED ORDER — SODIUM CHLORIDE 0.9 % IV SOLN
2.0000 g | Freq: Three times a day (TID) | INTRAVENOUS | Status: DC
  Administered 2023-03-06 – 2023-03-10 (×12): 2 g via INTRAVENOUS
  Filled 2023-03-06 (×14): qty 12.5

## 2023-03-06 MED ORDER — ROSUVASTATIN CALCIUM 10 MG PO TABS
10.0000 mg | ORAL_TABLET | Freq: Every day | ORAL | Status: DC
  Administered 2023-03-06 – 2023-03-10 (×5): 10 mg via ORAL
  Filled 2023-03-06 (×5): qty 1

## 2023-03-06 MED ORDER — SODIUM CHLORIDE 0.9 % IV BOLUS
1000.0000 mL | Freq: Once | INTRAVENOUS | Status: AC
  Administered 2023-03-06: 1000 mL via INTRAVENOUS

## 2023-03-06 MED ORDER — CHLORHEXIDINE GLUCONATE CLOTH 2 % EX PADS
6.0000 | MEDICATED_PAD | Freq: Every day | CUTANEOUS | Status: DC
  Administered 2023-03-06 – 2023-03-11 (×6): 6 via TOPICAL

## 2023-03-06 MED ORDER — SENNOSIDES-DOCUSATE SODIUM 8.6-50 MG PO TABS
1.0000 | ORAL_TABLET | Freq: Every evening | ORAL | Status: DC | PRN
  Administered 2023-03-09 – 2023-03-10 (×2): 1 via ORAL
  Filled 2023-03-06 (×2): qty 1

## 2023-03-06 MED ORDER — ONDANSETRON HCL 4 MG/2ML IJ SOLN
4.0000 mg | Freq: Once | INTRAMUSCULAR | Status: AC
  Administered 2023-03-06: 4 mg via INTRAVENOUS
  Filled 2023-03-06: qty 2

## 2023-03-06 MED ORDER — ONDANSETRON HCL 4 MG/2ML IJ SOLN
4.0000 mg | Freq: Four times a day (QID) | INTRAMUSCULAR | Status: DC | PRN
  Administered 2023-03-06: 4 mg via INTRAVENOUS
  Filled 2023-03-06: qty 2

## 2023-03-06 MED ORDER — VENLAFAXINE HCL ER 75 MG PO CP24
75.0000 mg | ORAL_CAPSULE | Freq: Every day | ORAL | Status: DC
  Administered 2023-03-07 – 2023-03-11 (×5): 75 mg via ORAL
  Filled 2023-03-06 (×5): qty 1

## 2023-03-06 MED ORDER — HYDROMORPHONE HCL 1 MG/ML IJ SOLN
0.5000 mg | INTRAMUSCULAR | Status: DC | PRN
  Administered 2023-03-06 – 2023-03-08 (×6): 0.5 mg via INTRAVENOUS
  Filled 2023-03-06 (×6): qty 1

## 2023-03-06 MED ORDER — TACROLIMUS 1 MG PO CAPS
1.0000 mg | ORAL_CAPSULE | Freq: Every morning | ORAL | Status: DC
  Administered 2023-03-07 – 2023-03-11 (×5): 1 mg via ORAL
  Filled 2023-03-06 (×5): qty 1

## 2023-03-06 MED ORDER — LISINOPRIL 20 MG PO TABS
20.0000 mg | ORAL_TABLET | Freq: Every day | ORAL | Status: DC
  Administered 2023-03-07 – 2023-03-10 (×3): 20 mg via ORAL
  Filled 2023-03-06 (×5): qty 1

## 2023-03-06 MED ORDER — SODIUM CHLORIDE 0.9 % IV SOLN
2.0000 g | Freq: Once | INTRAVENOUS | Status: AC
  Administered 2023-03-06: 2 g via INTRAVENOUS
  Filled 2023-03-06: qty 12.5

## 2023-03-06 NOTE — Assessment & Plan Note (Signed)
-   Ezetimibe 10 mg daily resumed 

## 2023-03-06 NOTE — Plan of Care (Signed)

## 2023-03-06 NOTE — ED Triage Notes (Signed)
Pt to ED for left sided chest pain x3 days. +shob. Pt ambulatory to triage, NAD noted

## 2023-03-06 NOTE — Hospital Course (Signed)
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  Vitals in the ED showed Tmax of 100.1, respiration rate of 18, heart rate of 105, blood pressure 110/78, SpO2 of 97% on room air.  Serum sodium is 136, potassium 4.9, chloride 103, bicarb of 22, BUN of 19, serum creatinine of 1.05, EGFR greater than 60, nonfasting blood glucose 173, WBC 11.5, hemoglobin 14.4, platelets of 148.  Urine pregnancy test was negative.  COVID PCR was negative.  UA is ordered and pending collection.  Blood cultures x 2 have been ordered and pending collection.  ED treatment: Cefepime 2 g IV one-time dose, metronidazole 500 mg IV one-time dose, vancomycin per pharmacy, sodium chloride 1 L bolus.

## 2023-03-06 NOTE — ED Notes (Signed)
Patient transported to CT 

## 2023-03-06 NOTE — ED Provider Notes (Signed)
[facility] Provider Note    Event Date/Time   First MD Initiated Contact with Patient 03/06/23 [phone]     (approximate)   History   Chest Pain   HPI  Alison [name] is a 47 y.o. female with an extensive past medical history including liver transplant presents to the ER for evaluation of 3 days of left-sided chest pain as well as shortness of breath.  She is status post liver transplant is on immunosuppressive therapy.  Has been having productive cough.  No dysuria no abdominal pain.     Physical Exam   Triage Vital Signs: ED Triage Vitals  Enc Vitals Group     BP --      Pulse --      Resp --      Temp --      Temp src --      SpO2 --      Weight 03/06/23 0735 250 lb (113.4 kg)     Height 03/06/23 0735 5\' 6"  (1.676 m)     Head Circumference --      Peak Flow --      Pain Score 03/06/23 0734 8     Pain Loc --      Pain Edu? --      Excl. in GC? --     Most recent vital signs: Vitals:   03/06/23 1237 03/06/23 1405  BP: (!) 119/93   Pulse: 99 98  Resp: 15 (!) 24  Temp: 98.8 F (37.1 C) 98.8 F (37.1 C)  SpO2: 99% 94%     Constitutional: Alert  Eyes: Conjunctivae are normal.  Head: Atraumatic. Nose: No congestion/rhinnorhea. Mouth/Throat: Mucous membranes are moist.   Neck: Painless ROM.  Cardiovascular:   Good peripheral circulation. Respiratory: Normal respiratory effort.  No retractions.  Gastrointestinal: Soft and nontender.  Musculoskeletal:  no deformity Neurologic:  MAE spontaneously. No gross focal neurologic deficits are appreciated.  Skin:  Skin is warm, dry and intact. No rash noted. Psychiatric: Mood and affect are normal. Speech and behavior are normal.    ED Results / Procedures / Treatments   Labs (all labs ordered are listed, but only abnormal results are displayed) Labs Reviewed  CBC - Abnormal; Notable for the following components:      Result Value   WBC 11.5 (*)    Platelets 148 (*)    All other  components within normal limits  LACTIC ACID, PLASMA - Abnormal; Notable for the following components:   Lactic Acid, Venous 2.3 (*)    All other components within normal limits  COMPREHENSIVE METABOLIC PANEL - Abnormal; Notable for the following components:   Glucose, Bld 178 (*)    Creatinine, Ser 1.05 (*)    All other components within normal limits  URINALYSIS, ROUTINE W REFLEX MICROSCOPIC - Abnormal; Notable for the following components:   Color, Urine YELLOW (*)    APPearance HAZY (*)    Protein, ur 100 (*)    Leukocytes,Ua SMALL (*)    All other components within normal limits  SARS CORONAVIRUS 2 BY RT PCR  CULTURE, BLOOD (ROUTINE X 2)  CULTURE, BLOOD (ROUTINE X 2)  LACTIC ACID, PLASMA  PROTIME-INR  APTT  PROCALCITONIN  HIV ANTIBODY (ROUTINE TESTING W REFLEX)  POC URINE PREG, ED  TROPONIN I (HIGH SENSITIVITY)  TROPONIN I (HIGH SENSITIVITY)     EKG  ED ECG REPORT I, Willy Eddy, the attending physician, personally viewed and interpreted this ECG.  Date: 03/06/2023  EKG Time: 7:36  Rate: 100  Rhythm: sinus  Axis: left  Intervals:rbbb  ST&T Change: nonspecific st abn, no stemi    RADIOLOGY Please see ED Course for my review and interpretation.  I personally reviewed all radiographic images ordered to evaluate for the above acute complaints and reviewed radiology reports and findings.  These findings were personally discussed with the patient.  Please see medical record for radiology report.    PROCEDURES:  Critical Care performed: No  Procedures   MEDICATIONS ORDERED IN ED: Medications  metroNIDAZOLE (FLAGYL) IVPB 500 mg (500 mg Intravenous New Bag/Given 03/06/23 1534)  morphine (PF) 4 MG/ML injection 4 mg (4 mg Intravenous Given 03/06/23 1457)  lactated ringers infusion (has no administration in time range)  acetaminophen (TYLENOL) tablet 650 mg (has no administration in time range)    Or  acetaminophen (TYLENOL) suppository 650 mg (has no  administration in time range)  ondansetron (ZOFRAN) tablet 4 mg (has no administration in time range)    Or  ondansetron (ZOFRAN) injection 4 mg (has no administration in time range)  enoxaparin (LOVENOX) injection 55 mg (has no administration in time range)  metroNIDAZOLE (FLAGYL) IVPB 500 mg (has no administration in time range)  senna-docusate (Senokot-S) tablet 1 tablet (has no administration in time range)  ceFEPIme (MAXIPIME) 2 g in sodium chloride 0.9 % 100 mL IVPB (has no administration in time range)  ceFEPIme (MAXIPIME) 2 g in sodium chloride 0.9 % 100 mL IVPB (0 g Intravenous Stopped 03/06/23 1308)  sodium chloride 0.9 % bolus 1,000 mL (0 mLs Intravenous Stopped 03/06/23 1525)  ondansetron (ZOFRAN) injection 4 mg (4 mg Intravenous Given 03/06/23 1223)  vancomycin (VANCOREADY) IVPB 2000 mg/400 mL (2,000 mg Intravenous New Bag/Given 03/06/23 1308)  iohexol (OMNIPAQUE) 300 MG/ML solution 100 mL (100 mLs Intravenous Contrast Given 03/06/23 1507)     IMPRESSION / MDM / ASSESSMENT AND PLAN / ED COURSE  I reviewed the triage vital signs and the nursing notes.                              Differential diagnosis includes, but is not limited to, sepsis, bacteremia, pneumonia, pancreatitis, enteritis, viral illness, bronchitis, COVID  Patient presenting to the ER for evaluation of symptoms as described above.  Based on symptoms, risk factors and considered above differential, this presenting complaint could reflect a potentially life-threatening illness therefore the patient will be placed on continuous pulse oximetry and telemetry for monitoring.  Laboratory evaluation will be sent to evaluate for the above complaints.  Will order blood work for sepsis.    Clinical Course as of 03/06/23 1555  Thu Mar 06, 2023  1121 LFTs normal.  Borderline elevated no leukocytosis.  Trope negative.  Awaiting lactate as well as procalcitonin. [PR]  1331 Given patient's presentation as she did arrive meeting  sepsis criteria COVID-negative as she is immunocompromised will consult hospitalist for admission. [PR]  1409 Urine with leukocytes many whites.  Possible urinary source but not showing any bacteria.  Awaiting CT imaging. [PR]    Clinical Course User Index [PR] Willy Eddy, MD     FINAL CLINICAL IMPRESSION(S) / ED DIAGNOSES   Final diagnoses:  Sepsis, due to unspecified organism, unspecified whether acute organ dysfunction present [facility])     Rx / DC Orders   ED Discharge Orders     None        Note:  This document was  prepared using Conservation officer, historic buildings and may include unintentional dictation errors.    Willy Eddy, MD 03/06/23 [phone]

## 2023-03-06 NOTE — Assessment & Plan Note (Signed)
-   Melatonin 5 mg nightly as needed for sleep ordered ?

## 2023-03-06 NOTE — Consult Note (Signed)
PHARMACY -  BRIEF ANTIBIOTIC NOTE   Pharmacy has received consult(s) for vancomycin and cefepime dosing from an ED provider.  The patient's profile has been reviewed for ht/wt/allergies/indication/available labs.    One time order(s) placed for cefepime 2 grams IV x 1 and Vancomycin 2 grams IV x 1.  Further antibiotics/pharmacy consults should be ordered by admitting physician if indicated.                       Thank you, Barrie Folk, PharmD 03/06/2023  11:33 AM

## 2023-03-06 NOTE — ED Notes (Signed)
Called dietary to request vegetarian tray.

## 2023-03-06 NOTE — Assessment & Plan Note (Signed)
Venlafaxine 70 mg +37.5 mg daily with breakfast resumed, mirtazapine 15 mg nightly resumed Lurasidone 20 mg with supper resumed

## 2023-03-06 NOTE — Assessment & Plan Note (Addendum)
Home oxycodone 10 mg p.o. twice daily resumed Ubrogepant 50 mg daily prn for migraine PDMP reviewed

## 2023-03-06 NOTE — Assessment & Plan Note (Signed)
Home long-acting insulin 40 units nightly resumed Insulin SSI with agents coverage ordered Home metformin not resumed on admission

## 2023-03-06 NOTE — Assessment & Plan Note (Addendum)
Leukocytosis Patient had elevated respiration rate, lactic acid elevated to 2.3, white cell count elevated to 11.5 Etiology/source of infection is pending at this time UA was positive for small leukocytes Blood cultures x 2 are in process Admit to telemetry medical, inpatient

## 2023-03-06 NOTE — Assessment & Plan Note (Signed)
-   Lisinopril 20 mg daily resumed 

## 2023-03-06 NOTE — Assessment & Plan Note (Addendum)
Query pericarditis, complete echo ordered Cardiology not consulted at this time pending complete echo, for consideration of pharmacologic stress test

## 2023-03-06 NOTE — Assessment & Plan Note (Signed)
Check urgent magnesium level on admission Home mirtazapine 15 mg nightly resumed for 03/07/2023

## 2023-03-06 NOTE — H&P (Addendum)
History and Physical   Alison [name] ZOX:[id number] DOB: 1975-11-30 DOA: 03/06/2023  PCP: [name], FNP  Patient coming from: Home  I have personally briefly reviewed patient's old medical records in [facility] EMR.  Chief Concern: Chest pain, shortness of breath  HPI: 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  Vitals in the ED showed Tmax of 100.1, respiration rate of 18, heart rate of 105, blood pressure 110/78, SpO2 of 97% on room air.  Serum sodium is 136, potassium 4.9, chloride 103, bicarb of 22, BUN of 19, serum creatinine of 1.05, EGFR greater than 60, nonfasting blood glucose 173, WBC 11.5, hemoglobin 14.4, platelets of 148.  Urine pregnancy test was negative.  COVID PCR was negative.  UA is ordered and pending collection.  Blood cultures x 2 have been ordered and pending collection.  ED treatment: Cefepime 2 g IV one-time dose, metronidazole 500 mg IV one-time dose, vancomycin per pharmacy, sodium chloride 1 L bolus. ------------------------------ At bedside, she is able tell me her name, age, current year, current location.  She reports left sided on her left chest, radiates down her left arm. She reports her chest hurts her to breath in. She endorses subjective fever.  She endorses that when she lays flat, she has chest discomfort.  She denies trauma to her person.   She endorses shortness of breath that is worse with exertion. She denies swelling of her lower extremities.  She denies recent viral infection. She denies dysuria, diarrhea, hematuria.  There was no suprapubic tenderness on exam.  Social history: She lives with her husband. She denies tobacco, etoh, recreational. She is disabled.  ROS: Constitutional: no weight change, no fever ENT/Mouth: no sore throat, no rhinorrhea Eyes: no eye pain, no vision changes Cardiovascular: + chest pain, + dyspnea,  no edema, no palpitations Respiratory: no cough, no sputum, no wheezing Gastrointestinal: no nausea, no vomiting, no diarrhea, no constipation Genitourinary: no urinary incontinence, no dysuria, no hematuria Musculoskeletal: no arthralgias, no myalgias Skin: no skin lesions, no pruritus, Neuro: + weakness, no loss of consciousness, no syncope Psych: no anxiety, no depression, no decrease appetite Heme/Lymph: no bruising, no bleeding  ED Course: Discussed with emergency medicine provider, patient requiring hospitalization for chief concerns of sepsis.  Assessment/Plan  Principal Problem:   Sepsis (HCC) Active Problems:   Fibromyalgia   Essential hypertension   Hyperlipidemia   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HCC)   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HCC)   Major depressive disorder, recurrent (HCC)   Morbid obesity (HCC)   Cubital tunnel syndrome on left   Insomnia   Bipolar disorder, in full remission, most recent episode mixed (HCC)   Prolonged Q-T interval on ECG   High risk medication use   Insulin dependent type 2 diabetes mellitus (HCC)   Chest pain   Assessment and Plan:  * Sepsis (HCC) Leukocytosis Patient had elevated respiration rate, lactic acid elevated to 2.3, white cell count elevated to 11.5 Etiology/source of infection is pending  at this time UA was positive for small leukocytes Blood cultures x 2 are in process Admit to telemetry medical, inpatient  Chest  pain Query pericarditis, complete echo ordered Cardiology not consulted at this time pending complete echo, for consideration of pharmacologic stress test  Insulin dependent type 2 diabetes mellitus (HCC) Home long-acting insulin 40 units nightly resumed Insulin SSI with agents coverage ordered Home metformin not resumed on admission  Prolonged Q-T interval on ECG Check urgent magnesium level on admission Home mirtazapine 15 mg nightly resumed for 03/07/2023  Bipolar disorder, in full remission, most recent episode mixed (HCC) Venlafaxine 70 mg +37.5 mg daily with breakfast resumed, mirtazapine 15 mg nightly resumed Lurasidone 20 mg with supper resumed  Insomnia Melatonin 5 mg nightly as needed for sleep ordered  Morbid obesity (HCC) Patient has BMI 40.35. This complicates overall care and prognosis.   Hyperlipidemia Ezetimibe 10 mg daily resumed  Essential hypertension Lisinopril 20 mg daily resumed  Fibromyalgia Home oxycodone 10 mg p.o. twice daily resumed Ubrogepant 50 mg daily prn for migraine PDMP reviewed  Chart reviewed.   DVT prophylaxis: Enoxaparin Code Status: Full code Diet: Heart healthy/carb modified Family Communication: Updated spouse at bedside with patient's permission Disposition Plan: Pending clinical course Consults called: None at this time Admission status: Telemetry medical, inpatient  Past Medical History:  Diagnosis Date   Abnormal uterine bleeding    Allergy    Anxiety    Arthritis    Bipolar disorder (manic depression) (HCC)    Chronic kidney failure    Chronic renal disease, stage III (HCC)    Diabetes mellitus without complication (HCC)    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HCC) 2015   Right axillary lymph node resected and chemo tx's.   Dyspnea    FH: trigeminal neuralgia    GERD (gastroesophageal reflux disease)    Heart murmur    Hepatic cirrhosis (HCC)    History of kidney stones    Hypertension    Kidney mass     Long Q-T syndrome    Lupoid hepatitis (HCC)    Lupus (HCC)    Major depressive disorder    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HCC) 06/2019   Chemo tx's   Migraine    Morbid obesity (HCC)    Neuromuscular disorder (HCC)    neuropathy   Neuropathy    Personality disorder (HCC)    Pineal gland cyst    Post herpetic neuralgia    PTSD (post-traumatic stress disorder)    Renal disorder    S/P liver transplant (HCC) 1993   Sleep apnea    has not recieved cpap yet-other one was recalled   Past Surgical History:  Procedure Laterality Date   BONE MARROW BIOPSY  01/13/2015   BREAST BIOPSY  12/2014   BREAST BIOPSY  2011   BREAST SURGERY     CHOLECYSTECTOMY     COLONOSCOPY     COLONOSCOPY WITH PROPOFOL N/A 02/28/2022   Procedure: COLONOSCOPY WITH PROPOFOL;  Surgeon: Midge Minium, MD;  Location: [facility] ENDOSCOPY;  Service: Endoscopy;  Laterality: N/A;   ESOPHAGOGASTRODUODENOSCOPY     ESOPHAGOGASTRODUODENOSCOPY (EGD) WITH PROPOFOL N/A 01/09/2021   Procedure: ESOPHAGOGASTRODUODENOSCOPY (EGD) WITH PROPOFOL;  Surgeon: Midge Minium, MD;  Location: [facility] ENDOSCOPY;  Service: Endoscopy;  Laterality: N/A;   HERNIA REPAIR     x4-all from liver transplant   HYSTEROSCOPY WITH D & C N/A 11/13/2021   Procedure: DILATATION AND CURETTAGE /HYSTEROSCOPY;  Surgeon: Natale Milch, MD;  Location: [facility] ORS;  Service: Gynecology;  Laterality: N/A;   infusaport     LIVER TRANSPLANT  12/17/1991   LUMBAR PUNCTURE     PORT A CATH INJECTION ([facility] HX)     RENAL BIOPSY     tumor removal  2015   cancer right side   Social History:  reports that she has never smoked. She has never used smokeless tobacco. She reports that she does not currently use alcohol. She reports that she does not currently use drugs.  Allergies  Allergen Reactions   Penicillin G Itching, Rash, Anaphylaxis and Palpitations   Lamictal [Lamotrigine] Rash   Carbamazepine Other (See Comments)    Medication interaction-prograf     Hydrocodone-Acetaminophen Itching   Naproxen Itching   Penicillins    Family History  Problem Relation Age of Onset   Heart disease Mother    Hypertension Mother    Cancer - Other Mother    Bipolar disorder Mother    Heart disease Father    Hypertension Father    Diabetes Father    Parkinson's disease Maternal Grandmother    Cancer Maternal Aunt    Cancer Maternal Uncle    Cancer Maternal Grandfather    Lupus Paternal Grandmother    Hypertension Brother    Family history: Family history reviewed and not pertinent.  Prior to Admission medications   Medication Sig Start Date End Date Taking? Authorizing Provider  baclofen (LIORESAL) 10 MG tablet Take 1 tablet by mouth 2 (two) times daily. 08/15/21  Yes [provider]  benztropine (COGENTIN) 1 MG tablet Take 1 tablet (1 mg total) by mouth daily as needed for tremors. 12/12/22  Yes [name], MD  Dulaglutide 4.5 MG/0.5ML SOPN Inject 4.5 mg into the skin once a week. 11/28/21  Yes [provider]  ezetimibe (ZETIA) 10 MG tablet Take 10 mg by mouth every morning. 04/09/21  Yes [provider]  Insulin Glargine (BASAGLAR KWIKPEN) 100 UNIT/ML Inject 40 Units into the skin at bedtime. 03/03/23  Yes [provider]  tacrolimus (PROGRAF) 1 MG capsule Take 1 capsule by mouth every morning. 02/20/23  Yes [provider]  UBRELVY 50 MG TABS Take 1 tablet by mouth daily as needed. 01/19/22  Yes [provider]  XTAMPZA ER 9 MG C12A Take 1 capsule by mouth every 12 (twelve) hours. 08/07/22  Yes [provider]  Cholecalciferol (VITAMIN D) 125 MCG (5000 UT) CAPS Take 5,000 Units by mouth daily.    [provider]  dronabinol (MARINOL) 2.5 MG capsule Take 2.5 mg by mouth See admin instructions. Takes every other day Patient not taking: Reported on 03/06/2023    [provider]  icosapent Ethyl (VASCEPA) 1 g capsule Take 2 capsules (2 g total) by mouth 2 (two) times daily.  09/07/21   Debbe Odea, MD  Insulin Degludec (TRESIBA) 100 UNIT/ML SOLN Inject 30 Units into the skin. Patient not taking: Reported on 03/06/2023    [provider]  lisinopril (ZESTRIL) 20 MG tablet TAKE ONE TABLET BY MOUTH ONCE DAILY 09/09/22   [name], FNP  lurasidone (LATUDA) 20 MG TABS tablet Take 1 tablet (20 mg total) by mouth daily with supper. 12/12/22   [name], MD  magnesium oxide (MAG-OX) 400 MG tablet Take by mouth.    [provider]  Melatonin 10 MG TABS Take 10 mg by mouth at bedtime.    [provider]  meloxicam (MOBIC) 15 MG tablet Take 1 tablet (15 mg total) by mouth daily.  Patient not taking: Reported on 03/06/2023 10/21/22 10/21/23  Cuthriell, [name], PA-C  metFORMIN (GLUCOPHAGE) 1000 MG tablet Take 1 tablet (1,000 mg total) by mouth 2 (two) times daily with a meal. 01/22/23   [name], FNP  methocarbamol (ROBAXIN) 500 MG tablet Take 1 tablet (500 mg total) by mouth 4 (four) times daily. 10/21/22   Cuthriell, [name], PA-C  mirabegron ER (MYRBETRIQ) 50 MG TB24 tablet Take 1 tablet (50 mg total) by mouth daily. 01/22/23   [name], FNP  mirtazapine (REMERON) 15 MG tablet TAKE ONE TABLET BY MOUTH EVERYDAY AT BEDTIME Patient taking differently: Take 15 mg by mouth at bedtime. 10/30/22   [name], MD  naloxone [facility]) nasal spray 4 mg/0.1 mL Place 0.4 mg into the nose once. 09/05/21   [provider]  NOVOLOG FLEXPEN 100 UNIT/ML FlexPen Inject into the skin. Sliding scale 01/21/23   [provider]  PRECISION QID TEST test strip  08/06/19   [provider]  pregabalin (LYRICA) 200 MG capsule Take 1 capsule (200 mg total) by mouth 2 (two) times daily. 12/03/21   Phineas Semen, MD  rosuvastatin (CRESTOR) 10 MG tablet Take 1 tablet (10 mg total) by mouth daily. 02/04/23   Debbe Odea, MD  venlafaxine XR (EFFEXOR XR) 37.5 MG 24 hr capsule Take 1 capsule (37.5 mg total) by mouth daily  with breakfast. Take along with 75 mg daily 12/12/22   [name], MD  venlafaxine XR (EFFEXOR XR) 75 MG 24 hr capsule Take 1 capsule (75 mg total) by mouth daily with breakfast. 12/12/22   [name], MD   Physical Exam: Vitals:   03/06/23 1700 03/06/23 1730 03/06/23 1812 03/06/23 1828  BP: 126/78 125/70  121/86  Pulse: 100 100  97  Resp: (!) 25 (!) 28  20  Temp:   98.7 F (37.1 C) (!) 100.7 F (38.2 C)  TempSrc:   Oral   SpO2: 94% 94%  90%  Weight:      Height:       Constitutional: appears older than chronological age, chronically ill, + fever Eyes: PERRL, lids and conjunctivae normal ENMT: Mucous membranes are moist. Posterior pharynx clear of any exudate or lesions. Age-appropriate dentition. Hearing appropriate Neck: normal, supple, no masses, no thyromegaly Respiratory: clear to auscultation bilaterally, no wheezing, no crackles. Normal respiratory effort. No accessory muscle use.  Cardiovascular: Regular rate and rhythm, no murmurs / rubs / gallops. No extremity edema. 2+ pedal pulses. No carotid bruits.  Abdomen: Morbidly obese abdomen, no tenderness, no masses palpated, no hepatosplenomegaly. Bowel sounds positive.  Musculoskeletal: no clubbing / cyanosis. No joint deformity upper and lower extremities. Good ROM, no contractures, no atrophy. Normal muscle tone.  Skin: no rashes, lesions, ulcers. No induration Neurologic: Sensation intact. Strength 5/5 in all 4.  Psychiatric: Normal judgment and insight. Alert and oriented x 3. Depressed mood.   EKG: independently reviewed, showing sinus tachycardia with rate of 102, QTc 516  Chest x-ray on Admission: I personally reviewed and I agree with radiologist reading as below.  CT CHEST ABDOMEN PELVIS W CONTRAST  Result Date: 03/06/2023 CLINICAL DATA:  Sepsis EXAM: CT CHEST, ABDOMEN, AND PELVIS WITH CONTRAST TECHNIQUE: Multidetector CT imaging of the chest, abdomen and pelvis was performed following the standard protocol  during bolus administration of intravenous contrast. RADIATION DOSE REDUCTION: This exam was performed according to the departmental dose-optimization program which includes automated exposure control, adjustment of the mA and/or kV according to patient size and/or  use of iterative reconstruction technique. CONTRAST:  OMNIPAQUE IOHEXOL 300 MG/ML  SOLN COMPARISON:  None Available. FINDINGS: CT CHEST FINDINGS Cardiovascular: Small pericardial effusion measuring 6 mm in depth. There is inflammatory stranding within the pericardial fat (image 38/2 ) Mediastinum/Nodes: No axillary or supraclavicular adenopathy. No mediastinal or hilar adenopathy. No pericardial fluid. Esophagus normal. Lungs/Pleura: No airspace disease. No pneumothorax. No pleural fluid. Musculoskeletal: No aggressive osseous lesion. CT ABDOMEN AND PELVIS FINDINGS Hepatobiliary: No focal hepatic lesion. Postcholecystectomy. Common bile duct mildly dilated 12 mm following cholecystectomy no biliary dilatation. Pancreas: Pancreas is normal. No ductal dilatation. No pancreatic inflammation. Spleen: Normal spleen Adrenals/urinary tract: Adrenal glands normal. Bilateral renal cortical scarring. 15 mm simple fluid attenuation LEFT renal cyst on postcontrast imaging is present. Small nonenhancing cyst of the RIGHT kidney. No enhancing renal cortical lesion. The ureters and bladder normal. Stomach/Bowel: The stomach, duodenum, and small bowel normal. The colon and rectosigmoid colon are normal. Vascular/Lymphatic: Abdominal aorta is normal caliber. There is no retroperitoneal or periportal lymphadenopathy. No pelvic lymphadenopathy. Reproductive: Uterus and adnexa unremarkable. Other: No free fluid. Musculoskeletal: No aggressive osseous lesion. IMPRESSION: CHEST IMPRESSION: 1. Small pericardial effusion and inflammation in the pericardial fat. Recommend correlation for pericarditis. 2. No acute pulmonary findings. PELVIS IMPRESSION: 1. No acute findings  in the abdomen pelvis. 2. Bilateral renal cortical scarring and benign Bosniak 1 renal cysts. No follow-up recommended for benign renal lesion. Electronically Signed   By: Genevive Bi M.D.   On: 03/06/2023 15:36   DG Chest 2 View  Result Date: 03/06/2023 CLINICAL DATA:  Chest pain EXAM: CHEST - 2 VIEW COMPARISON:  Chest radiograph 10/21/2022. FINDINGS: The left chest wall port is stable in position. The cardiomediastinal silhouette is stable. There is no focal consolidation or pulmonary edema. There is no pleural effusion or pneumothorax. Pleural thickening along the right lateral chest wall is unchanged. There is no acute osseous abnormality. IMPRESSION: Stable chest with no radiographic evidence of acute cardiopulmonary process. Electronically Signed   By: Lesia Hausen M.D.   On: 03/06/2023 08:03    Labs on Admission: I have personally reviewed following labs  CBC: Recent Labs  Lab 03/06/23 0918  WBC 11.5*  HGB 14.4  HCT 43.0  MCV 87.0  PLT 148*   Basic Metabolic Panel: Recent Labs  Lab 03/06/23 0918  NA 136  K 4.9  CL 103  CO2 22  GLUCOSE 178*  BUN 19  CREATININE 1.05*  CALCIUM 9.4   GFR: Estimated Creatinine Clearance: 82.8 mL/min (A) (by C-G formula based on SCr of 1.05 mg/dL (H)).  Liver Function Tests: Recent Labs  Lab 03/06/23 0918  AST 34  ALT 25  ALKPHOS 67  BILITOT 1.2  PROT 7.8  ALBUMIN 4.3   Coagulation Profile: Recent Labs  Lab 03/06/23 0918  INR 1.0   Urine analysis:    Component Value Date/Time   COLORURINE YELLOW (A) 03/06/2023 1337   APPEARANCEUR HAZY (A) 03/06/2023 1337   APPEARANCEUR Clear 02/12/2022 1125   LABSPEC 1.015 03/06/2023 1337   PHURINE 5.0 03/06/2023 1337   GLUCOSEU NEGATIVE 03/06/2023 1337   HGBUR NEGATIVE 03/06/2023 1337   BILIRUBINUR NEGATIVE 03/06/2023 1337   BILIRUBINUR Negative 02/12/2022 1125   KETONESUR NEGATIVE 03/06/2023 1337   PROTEINUR 100 (A) 03/06/2023 1337   UROBILINOGEN 0.2 08/29/2021 1514   NITRITE  NEGATIVE 03/06/2023 1337   LEUKOCYTESUR SMALL (A) 03/06/2023 1337   CRITICAL CARE Performed by: Dr. Sedalia Muta  Total critical care time: 32 minutes  Critical care  time was exclusive of separately billable procedures and treating other patients.  Critical care was necessary to treat or prevent imminent or life-threatening deterioration.  Critical care was time spent personally by me on the following activities: development of treatment plan with patient and/or surrogate as well as nursing, discussions with consultants, evaluation of patient's response to treatment, examination of patient, obtaining history from patient or surrogate, ordering and performing treatments and interventions, ordering and review of laboratory studies, ordering and review of radiographic studies, pulse oximetry and re-evaluation of patient's condition.  This document was prepared using Dragon Voice Recognition software and may include unintentional dictation errors.  Dr. Sedalia Muta [facility]  If 7PM-7AM, please contact overnight-coverage provider If 7AM-7PM, please contact day attending provider [email or web address]  03/06/2023, 7:31 PM

## 2023-03-06 NOTE — Progress Notes (Addendum)
       CROSS COVER NOTE  NAME: OKSANA ALLES MRN: [id number] DOB : 12-05-1975    Concern from nurse Eyvonne Mechanic   Since arrival to the floor: 1.Ongoing chest pain, similar to admitting chest pain 2. Fever 3. Rash at site of vancomycin infusion 4. Request to access port as IV team unable to get another line .   Pertinent findings on chart review: History and physical reviewed Patient admitted a couple hours prior with chest pain attributed to pericarditis and sepsis of unknown source CT chest abdomen and pelvis showed "Small pericardial effusion and inflammation in the pericardial fat. Recommend correlation for pericarditis" Cardiac Panel (last 3 results) Recent Labs    03/06/23 0918 03/06/23 1337  TROPONINIHS 6 6     Assessment and  Interventions   Assessment: 1.  Chest pain, identical to pain on admission 2.  Fever, related to sepsis from admission 3.  Rash, possible vancomycin allergy 4. Poor IV access  Plan: Continue management of chest pain and fever as outlined in H&P a couple hours prior.  As needed pain meds already ordered IV Benadryl Regarding Rash, spoke with pharmacy called and patient has not previously be exposed to vancomycin in our system.... Zyvox IV to replace vancomycin as a precaution and update allergy list Due to IV team being unable to get a line for administration of treatment for sepsis, will give permission to access port

## 2023-03-06 NOTE — Progress Notes (Signed)
MEWS Progress Note  Patient Details Name: ANEA WISCH MRN: [id number] DOB: [birth year] Today's Date: 03/06/2023   MEWS Flowsheet Documentation:  Assess: MEWS Score Temp: (!) 101.2 F (38.4 C) BP: 115/81 MAP (mmHg): 93 Pulse Rate: (!) 101 ECG Heart Rate: (!) 101 Resp: 18 SpO2: 94 % O2 Device: Room Air Assess: MEWS Score MEWS Temp: 1 MEWS Systolic: 0 MEWS Pulse: 1 MEWS RR: 0 MEWS LOC: 0 MEWS Score: 2 MEWS Score Color: Yellow Assess: SIRS CRITERIA SIRS Temperature : 1 SIRS Respirations : 0 SIRS Pulse: 1 SIRS WBC: 0 SIRS Score Sum : 2 Assess: if the MEWS score is Yellow or Red Were vital signs taken at a resting state?: Yes Focused Assessment: No change from prior assessment Does the patient meet 2 or more of the SIRS criteria?: Yes Does the patient have a confirmed or suspected source of infection?: Yes MEWS guidelines implemented : Yes, yellow Treat MEWS Interventions: Considered administering scheduled or prn medications/treatments as ordered Take Vital Signs Increase Vital Sign Frequency : Yellow: Q2hr x1, continue Q4hrs until patient remains green for 12hrs Escalate MEWS: Escalate: Yellow: Discuss with charge nurse and consider notifying provider and/or RRT Notify: Charge Nurse/RN Name of Charge Nurse/RN Notified: Corrie Dandy, RN Provider Notification Provider Name/Title: Lindajo Royal, MD Date Provider Notified: 03/06/23 Time Provider Notified: 1930 Method of Notification: Page, Call Notification Reason: Change in status, Other (Comment) (CP and temp elevated.) Provider response: See new orders Date of Provider Response: 03/06/23 Time of Provider Response: 1942      Ernest Mallick 03/06/2023, 8:30 PM

## 2023-03-06 NOTE — Consult Note (Signed)
CODE SEPSIS - PHARMACY COMMUNICATION  **Broad Spectrum Antibiotics should be administered within 1 hour of Sepsis diagnosis**  Time Code Sepsis Called/Page Received: 1130  Antibiotics Ordered: cefepime, vancomycin, and Flagyl  Time of 1st antibiotic administration: 1215  Additional action taken by pharmacy: N/A  Barrie Folk ,PharmD Clinical Pharmacist  03/06/2023  11:36 AM

## 2023-03-06 NOTE — Progress Notes (Signed)
Responded to consult for IV. Noted documentation of chest port. On arrival, RN stated there was some question about whether the port should be used this visit. RN reaching out to MD to clarify.

## 2023-03-06 NOTE — Consult Note (Signed)
Pharmacy Antibiotic Note  Alison [name] is a 47 y.o. female admitted on 03/06/2023 with sepsis.  Pharmacy has been consulted for vancomycin and cefepime dosing.  Vancomycin 2000 mg IV x 1 given 5/30 @ 1308  Plan: Give vancomycin 500 mg IV x 1 to complete a loading dose for 2500 mg Start vancomycin 1500 mg IV every 24 hours Estimated AUC: 483.1, Cmin 10.6 Wt 113.4 kg, Scr 1.05, Vd coefficient 0.5 Start cefepime 2 grams IV every 8 hours Vancomycin levels at steady state or as clinically indicated Flagyl 500 mg IV every 12 per provider Follow renal function and cultures for adjustments  Height: 5\' 6"  (167.6 cm) Weight: 113.4 kg (250 lb) IBW/kg (Calculated) : 59.3  Temp (24hrs), Avg:99.2 F (37.3 C), Min:98.8 F (37.1 C), Max:100.1 F (37.8 C)  Recent Labs  Lab 03/06/23 0918 03/06/23 1337  WBC 11.5*  --   CREATININE 1.05*  --   LATICACIDVEN 2.3* 1.9    Estimated Creatinine Clearance: 82.8 mL/min (A) (by C-G formula based on SCr of 1.05 mg/dL (H)).    Allergies  Allergen Reactions   Penicillin G Itching, Rash, Anaphylaxis and Palpitations   Lamictal [Lamotrigine] Rash   Carbamazepine Other (See Comments)    Medication interaction-prograf    Hydrocodone-Acetaminophen Itching   Naproxen Itching   Penicillins     Antimicrobials this admission: Vancomycin 5/30 >> cefepime 5/30 >>  Flagyl 5/30>>  Dose adjustments this admission: N/A  Microbiology results: 5/30 BCx: pending   Thank you for allowing pharmacy to be a part of this patient's care.  Barrie Folk, PharmD 03/06/2023 4:15 PM

## 2023-03-06 NOTE — ED Notes (Signed)
Right arm covered in no blood pressure/blood draw sleeve

## 2023-03-06 NOTE — Sepsis Progress Note (Signed)
Elink will follow per sepsis protocol  

## 2023-03-06 NOTE — Assessment & Plan Note (Signed)
Patient has BMI 40.35. This complicates overall care and prognosis.

## 2023-03-07 ENCOUNTER — Inpatient Hospital Stay (HOSPITAL_COMMUNITY)
Admit: 2023-03-07 | Discharge: 2023-03-07 | Disposition: A | Discharge status: Home / Self Care | Payer: Medicaid Other | Attending: Internal Medicine | Admitting: Internal Medicine

## 2023-03-07 DIAGNOSIS — E782 Mixed hyperlipidemia: Secondary | ICD-10-CM | POA: Diagnosis not present

## 2023-03-07 DIAGNOSIS — M797 Fibromyalgia: Secondary | ICD-10-CM | POA: Diagnosis not present

## 2023-03-07 DIAGNOSIS — I300 Acute nonspecific idiopathic pericarditis: Secondary | ICD-10-CM

## 2023-03-07 DIAGNOSIS — I10 Essential (primary) hypertension: Secondary | ICD-10-CM | POA: Diagnosis not present

## 2023-03-07 DIAGNOSIS — R9431 Abnormal electrocardiogram [ECG] [EKG]: Secondary | ICD-10-CM | POA: Diagnosis not present

## 2023-03-07 DIAGNOSIS — Z794 Long term (current) use of insulin: Secondary | ICD-10-CM

## 2023-03-07 DIAGNOSIS — R072 Precordial pain: Secondary | ICD-10-CM | POA: Diagnosis not present

## 2023-03-07 DIAGNOSIS — E119 Type 2 diabetes mellitus without complications: Secondary | ICD-10-CM

## 2023-03-07 DIAGNOSIS — R079 Chest pain, unspecified: Secondary | ICD-10-CM

## 2023-03-07 DIAGNOSIS — Z79899 Other long term (current) drug therapy: Secondary | ICD-10-CM | POA: Diagnosis not present

## 2023-03-07 DIAGNOSIS — A419 Sepsis, unspecified organism: Secondary | ICD-10-CM | POA: Diagnosis not present

## 2023-03-07 DIAGNOSIS — F3178 Bipolar disorder, in full remission, most recent episode mixed: Secondary | ICD-10-CM | POA: Diagnosis not present

## 2023-03-07 LAB — BASIC METABOLIC PANEL
Anion gap: 7 (ref 5–15)
BUN: 19 mg/dL (ref 6–20)
CO2: 23 mmol/L (ref 22–32)
Calcium: 8.5 mg/dL — ABNORMAL LOW (ref 8.9–10.3)
Chloride: 105 mmol/L (ref 98–111)
Creatinine, Ser: 1.07 mg/dL — ABNORMAL HIGH (ref 0.44–1.00)
GFR, Estimated: >60 mL/min (ref >60)
Glucose, Bld: 187 mg/dL — ABNORMAL HIGH (ref 70–99)
Potassium: 4.3 mmol/L (ref 3.5–5.1)
Sodium: 135 mmol/L (ref 135–145)

## 2023-03-07 LAB — CULTURE, BLOOD (ROUTINE X 2): Culture: NO GROWTH

## 2023-03-07 LAB — CBC
HCT: 35 % — ABNORMAL LOW (ref 36.0–46.0)
Hemoglobin: 11.7 g/dL — ABNORMAL LOW (ref 12.0–15.0)
MCH: 29.4 pg (ref 26.0–34.0)
MCHC: 33.4 g/dL (ref 30.0–36.0)
MCV: 87.9 fL (ref 80.0–100.0)
Platelets: 122 10*3/uL — ABNORMAL LOW (ref 150–400)
RBC: 3.98 MIL/uL (ref 3.87–5.11)
RDW: 14.6 % (ref 11.5–15.5)
WBC: 9.7 10*3/uL (ref 4.0–10.5)
nRBC: 0 % (ref 0.0–0.2)

## 2023-03-07 LAB — HIV ANTIBODY (ROUTINE TESTING W REFLEX): HIV Screen 4th Generation wRfx: NONREACTIVE

## 2023-03-07 LAB — ECHOCARDIOGRAM COMPLETE
AR max vel: 2.23 cm2
AV Area VTI: 2.04 cm2
AV Area mean vel: 2.08 cm2
AV Mean grad: 7 mmHg
AV Peak grad: 12 mmHg
Ao pk vel: 1.73 m/s
Area-P 1/2: 4.08 cm2
Height: 66 in
MV VTI: 3.47 cm2
S' Lateral: 2.5 cm
Weight: 4000 oz

## 2023-03-07 LAB — CORTISOL-AM, BLOOD: Cortisol - AM: 7.2 ug/dL (ref 6.7–22.6)

## 2023-03-07 LAB — GLUCOSE, CAPILLARY
Glucose-Capillary: 175 mg/dL — ABNORMAL HIGH (ref 70–99)
Glucose-Capillary: 271 mg/dL — ABNORMAL HIGH (ref 70–99)
Glucose-Capillary: 216 mg/dL — ABNORMAL HIGH (ref 70–99)
Glucose-Capillary: 178 mg/dL — ABNORMAL HIGH (ref 70–99)

## 2023-03-07 LAB — SEDIMENTATION RATE: Sed Rate: 46 mm/hr — ABNORMAL HIGH (ref 0–20)

## 2023-03-07 LAB — C-REACTIVE PROTEIN: CRP: 8.8 mg/dL — ABNORMAL HIGH (ref <1.0)

## 2023-03-07 LAB — PROTIME-INR
INR: 1.2 (ref 0.8–1.2)
Prothrombin Time: 14.9 seconds (ref 11.4–15.2)

## 2023-03-07 MED ORDER — COLCHICINE 0.6 MG PO TABS
0.6000 mg | ORAL_TABLET | Freq: Two times a day (BID) | ORAL | Status: DC
  Administered 2023-03-07 – 2023-03-11 (×8): 0.6 mg via ORAL
  Filled 2023-03-07 (×8): qty 1

## 2023-03-07 MED ORDER — MIRTAZAPINE 15 MG PO TABS
15.0000 mg | ORAL_TABLET | Freq: Every day | ORAL | Status: DC
  Administered 2023-03-07 – 2023-03-10 (×5): 15 mg via ORAL
  Filled 2023-03-07 (×5): qty 1

## 2023-03-07 MED ORDER — MAGNESIUM SULFATE 2 GM/50ML IV SOLN
2.0000 g | Freq: Once | INTRAVENOUS | Status: AC
  Administered 2023-03-07: 2 g via INTRAVENOUS
  Filled 2023-03-07: qty 50

## 2023-03-07 NOTE — Progress Notes (Signed)
*  PRELIMINARY RESULTS* Echocardiogram 2D Echocardiogram has been performed.  Carolyne Fiscal 03/07/2023, 2:55 PM

## 2023-03-07 NOTE — Progress Notes (Signed)
Progress Note   [name] ZOX:[id number] DOB: 10-29-1975 DOA: 03/06/2023     1 DOS: the patient was seen and examined on 03/07/2023   Brief hospital course: Ms. 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 She was febrile started on broad spectrum empiric antibiotics. CT chest no PE, has small pleural effusion. Cardiology consulted for ongoing chest pian.  Assessment and Plan: * Sepsis (HCC) She had fever, tachycardia, tachypnea, leukocytosis, lactic acid high Etiology/source of infection unknown at this time. Urine, blood cultures x 2 are in process Continue to monitor vitals closely. Given her immunosuppressant use, will continue broad spectrum antibiotics. She had rash with vancomycin changed to Zyvox.  Chest pain Possible pericarditis, CT chest showed pericardial effusion. Troponin normal. EKG sinus, no new changes. Echo pending. Cardiology consult placed, informed by secure chat. She has allergy to Naproxen, opiates PRN for pain.  Insulin dependent type 2 diabetes mellitus (HCC) Home long-acting insulin 40 units nightly resumed Insulin SSI with agents coverage ordered Home metformin not resumed on admission  Prolonged Q-T interval on ECG Mag low replaced with IV supplements. Add on to AM labs ordered. Hold QT prolonging drugs.  Bipolar disorder, in full remission,  most recent episode mixed (HCC) Venlafaxine 70 mg +37.5 mg daily with breakfast resumed, mirtazapine 15 mg nightly resumed Lurasidone 20 mg with supper resumed  Insomnia Continue Melatonin 5 mg nightly as needed for sleep.  Morbid obesity (HCC) Patient has BMI 40.35. This complicates overall care and prognosis.  Discussed diet, exercise and weight reduction.  Hyperlipidemia Continue Ezetimibe 10 mg daily.  Essential hypertension BP stable. Continue Lisinopril 20 mg daily.  Fibromyalgia Home dose oxycodone 10 mg p.o. twice daily resumed. Dilaudid for severe pain. Ubrogepant 50 mg daily prn for migraine  DVT prophylaxis- Lovenox.  Subjective: Patient is seen and examined today morning. She is sitting in bed. Has on and off pleuritic chest pain, radiates to neck, shoulder. Appetite good. Tmax 101.2. She had rash at infusion site last night, Vancomycin held.  Physical Exam: Vitals:   03/07/23 0301 03/07/23 0527 03/07/23 0745 03/07/23 1136  BP:  108/75 123/89 (!) 145/84  Pulse:  86 90 95  Resp: 17 19 18 18   Temp:  98.4 F (36.9 C) 98.3 F (36.8 C) 99.4 F (37.4 C)  TempSrc:      SpO2:  94% 98% 96%  Weight:      Height:       General - Middle aged morbidly obese Caucasian female, no acute distress. Lungs -distant breath sounds, basal rales. Heart- S1, S2 heard, no murmurs. Abdomen - soft obese, bowel sounds good. Neuro - Alert, awake and oriented, non focal.  Data Reviewed:  CBC, BMP, EKG  Family Communication: Patient and her significant other understand and agree with above plan.  Disposition: Status is: Inpatient Remains inpatient appropriate because: ongoing chest pain need cardiology eval, work up of sepsis  Planned Discharge Destination: Home    Time spent: 43 minutes  Author: Marcelino Duster, MD 03/07/2023  11:56 AM  For on call review [email or web address].

## 2023-03-07 NOTE — Progress Notes (Signed)
Patient told RN she takes mirtazapine nightly and needs it to sleep. Patient requests to take this medication tonight. RN see's the medication is ordered to start 03/07/23.   RN notified Lindajo Royal, MD of the above information.   RN received verbal order: "put it to start tonight"   RN had pharmacy change the order start to this shift. Per MD verbal order.

## 2023-03-07 NOTE — Inpatient Diabetes Management (Signed)
Inpatient Diabetes Program Recommendations  AACE/ADA: New Consensus Statement on Inpatient Glycemic Control (2015)  Target Ranges:  Prepandial:   less than 140 mg/dL      Peak postprandial:   less than 180 mg/dL (1-2 hours)      Critically ill patients:  140 - 180 mg/dL   Lab Results  Component Value Date   GLUCAP 271 (H) 03/07/2023   HGBA1C 6.4 03/06/2022    Review of Glycemic Control  Latest Reference Range & Units 03/06/23 21:33 03/07/23 07:43 03/07/23 11:37  Glucose-Capillary 70 - 99 mg/dL 960 (H) 454 (H) 098 (H)   Diabetes history: DM 2 Outpatient Diabetes medications: Basaglar 40 units qhs, Trulicity 4.5 mg weekly, Metformin 1000 mg bid, Novolog 0-30 units tid Current orders for Inpatient glycemic control:  Semglee 40 units qhs Novolog 0-15 units tid + hs  Inpatient Diabetes Program Recommendations:    -  Add Novolog 3 units tid meal coverage if eating >50% of meals  Thanks,  Christena Deem RN, MSN, BC-ADM Inpatient Diabetes Coordinator Team Pager [phone] (8a-5p)

## 2023-03-07 NOTE — Consult Note (Signed)
Cardiology Consultation   Patient ID: Alison [name] MRN: [id number]; DOB: February 01, 1976  Admit date: 03/06/2023 Date of Consult: 03/07/2023  PCP:  [name], FNP   [facility] Providers Cardiologist:  Debbe Odea, MD   {   Patient Profile:   Alison [name] is a 47 y.o. 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 Clide Dales.  History of Present Illness:   Alison [name] is seen by Dr. Azucena Cecil. Echo 10/2020 showed LVEF 60-65%, normal diastolic function. Lexiscan Myoview in 09/2021 showed no evidence of ischemia, low risk scan. She has prolonged AT 2/2 RBBB. Not on statins due to h/o liver transplant.   The patient presented to the ER 03/06/23 with chest pain. She reports 5 days of sharp chest pain. It was constant and worse with a deep breath. She reported associated SOB. She reported recent fever and chills. No lower leg edema.   In the ET she had a fever 100.1, RR 18, HR 105bpm, BP 110/78, 97%O2 on RA. Labs showed Na 136, chloride 103, bicarb 22, BUN 19, Scr 1.05, WBC 11.5, Hgb 14.4, plt 148. Urine pregnancy negative. UA with leukocytosis. BC ordered. CXR non acute. Chest CTA showed small pericardial effusion and inflammation in the pericardial fat. No acute findings in the abd/pelvis. She was started on IV abx and admitted for further work-up.     Past Medical History:  Diagnosis Date   Abnormal uterine bleeding    Allergy    Anxiety    Arthritis    Bipolar disorder (manic depression) (HCC)    Chronic kidney failure    Chronic  renal disease, stage III (HCC)    Diabetes mellitus without complication (HCC)    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HCC) 2015   Right axillary lymph node resected and chemo tx's.   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   Chemo tx's   Migraine    Morbid obesity (HCC)    Neuromuscular disorder (HCC)    neuropathy   Neuropathy    Personality disorder (HCC)    Pineal gland cyst    Post herpetic neuralgia    PTSD (post-traumatic stress disorder)    Renal disorder    S/P liver transplant (HCC) 1993   Sleep apnea    has not recieved cpap yet-other one was recalled    Past Surgical History:  Procedure Laterality Date   BONE MARROW BIOPSY  01/13/2015   BREAST BIOPSY  12/2014   BREAST BIOPSY  2011   BREAST SURGERY     CHOLECYSTECTOMY     COLONOSCOPY     COLONOSCOPY WITH PROPOFOL N/A 02/28/2022   Procedure: COLONOSCOPY WITH PROPOFOL;  Surgeon: Midge Minium, MD;  Location: [facility] ENDOSCOPY;  Service: Endoscopy;  Laterality: N/A;   ESOPHAGOGASTRODUODENOSCOPY     ESOPHAGOGASTRODUODENOSCOPY (EGD) WITH PROPOFOL N/A 01/09/2021   Procedure: ESOPHAGOGASTRODUODENOSCOPY (EGD) WITH PROPOFOL;  Surgeon: Midge Minium, MD;  Location: [facility] ENDOSCOPY;  Service: Endoscopy;  Laterality: N/A;   HERNIA REPAIR     x4-all from liver transplant   HYSTEROSCOPY WITH D & C N/A 11/13/2021   Procedure: DILATATION AND CURETTAGE /HYSTEROSCOPY;  Surgeon: Natale Milch, MD;  Location: [facility] ORS;  Service: Gynecology;  Laterality: N/A;   infusaport     LIVER TRANSPLANT  12/17/1991   LUMBAR PUNCTURE     PORT A CATH INJECTION ([facility] HX)     RENAL BIOPSY     tumor removal  2015   cancer right side     Home Medications:   Prior to Admission medications   Medication Sig Start Date End Date Taking? Authorizing Provider  baclofen (LIORESAL) 10 MG tablet Take 1 tablet by mouth 2 (two) times daily. 08/15/21  Yes [provider]  benztropine (COGENTIN) 1 MG tablet Take 1 tablet (1 mg total) by mouth daily as needed for tremors. 12/12/22  Yes [name], MD  Cholecalciferol (VITAMIN D) 125 MCG (5000 UT) CAPS Take 5,000 Units by mouth daily.   Yes [provider]  Dulaglutide 4.5 MG/0.5ML SOPN Inject 4.5 mg into the skin once a week. 11/28/21  Yes [provider]  ezetimibe (ZETIA) 10 MG tablet Take 10 mg by mouth every morning. 04/09/21  Yes [provider]  icosapent Ethyl (VASCEPA) 1 g capsule Take 2 capsules (2 g total) by mouth 2 (two) times daily. 09/07/21  Yes Agbor-Etang, Arlys John, MD  Insulin Glargine (BASAGLAR KWIKPEN) 100 UNIT/ML Inject 40 Units into the skin at bedtime. 03/03/23  Yes [provider]  lisinopril (ZESTRIL) 20 MG tablet TAKE ONE TABLET BY MOUTH ONCE DAILY 09/09/22  Yes [name], FNP  lurasidone (LATUDA) 20 MG TABS tablet Take 1 tablet (20 mg total) by mouth daily with supper. 12/12/22  Yes [name], MD  Melatonin 10 MG TABS Take 10 mg by mouth at bedtime.   Yes [provider]  metFORMIN (GLUCOPHAGE) 1000 MG tablet Take 1 tablet (1,000 mg total) by mouth 2 (two) times daily with a meal. 01/22/23  Yes [name], FNP  mirabegron ER (MYRBETRIQ) 50 MG TB24 tablet Take 1 tablet (50 mg total) by mouth daily. 01/22/23  Yes [name], FNP  mirtazapine (REMERON) 15 MG tablet TAKE ONE TABLET BY MOUTH EVERYDAY AT BEDTIME Patient taking differently: Take 15 mg by mouth at bedtime. 10/30/22  Yes [name], MD  naloxone (NARCAN) nasal spray 4 mg/0.1 mL Place 0.4 mg into the nose once. 09/05/21  Yes [provider]  NOVOLOG FLEXPEN 100 UNIT/ML FlexPen Inject 0-30 Units into the skin 3 (three) times daily with meals. Sliding scale  01/21/23  Yes [provider]  pregabalin (LYRICA) 200 MG capsule Take 1 capsule (200 mg total) by mouth 2 (two) times daily. 12/03/21  Yes Phineas Semen, MD  rosuvastatin (CRESTOR) 10 MG tablet Take 1 tablet (10 mg total) by mouth daily. 02/04/23  Yes Agbor-Etang, Arlys John, MD  tacrolimus (PROGRAF) 1 MG capsule Take 1 capsule by mouth every morning. 02/20/23  Yes [provider]  UBRELVY 50 MG TABS Take 1 tablet by mouth daily as needed. 01/19/22  Yes [provider]  venlafaxine XR (EFFEXOR XR) 37.5 MG 24 hr capsule Take 1 capsule (37.5 mg total) by mouth daily with breakfast. Take along with 75  mg daily 12/12/22  Yes [name], Levin Bacon, MD  venlafaxine XR (EFFEXOR XR) 75 MG 24 hr capsule Take 1 capsule (75 mg total) by mouth daily with breakfast. 12/12/22  Yes [name], Levin Bacon, MD  XTAMPZA ER 9 MG C12A Take 1 capsule by mouth every 12 (twelve) hours. 08/07/22  Yes [provider]  dronabinol (MARINOL) 2.5 MG capsule Take 2.5 mg by mouth See admin instructions. Takes every other day Patient not taking: Reported on 03/06/2023    [provider]  Insulin Degludec (TRESIBA) 100 UNIT/ML SOLN Inject 30 Units into the skin. Patient not taking: Reported on 03/06/2023    [provider]  magnesium oxide (MAG-OX) 400 MG tablet Take by mouth. Patient not taking: Reported on 03/06/2023    [provider]  meloxicam (MOBIC) 15 MG tablet Take 1 tablet (15 mg total) by mouth daily. Patient not taking: Reported on 03/06/2023 10/21/22 10/21/23  Cuthriell, [name], PA-C  methocarbamol (ROBAXIN) 500 MG tablet Take 1 tablet (500 mg total) by mouth 4 (four) times daily. Patient not taking: Reported on 03/06/2023 10/21/22   Cuthriell, [name], PA-C  PRECISION QID TEST test strip  08/06/19   [provider]    Inpatient Medications: Scheduled Meds:  baclofen  10 mg Oral BID   Chlorhexidine Gluconate Cloth  6 each Topical Daily   enoxaparin (LOVENOX)  injection  55 mg Subcutaneous Q24H   ezetimibe  10 mg Oral Daily   icosapent Ethyl  2 g Oral BID   insulin aspart  0-15 Units Subcutaneous TID WC   insulin aspart  0-5 Units Subcutaneous QHS   insulin glargine-yfgn  40 Units Subcutaneous QHS   lisinopril  20 mg Oral Daily   lurasidone  20 mg Oral Q supper   melatonin  10 mg Oral QHS   mirabegron ER  50 mg Oral Daily   mirtazapine  15 mg Oral QHS   oxyCODONE  10 mg Oral Q12H   pregabalin  200 mg Oral BID   rosuvastatin  10 mg Oral Daily   sodium chloride flush  10-40 mL Intracatheter Q12H   tacrolimus  1 mg Oral q morning   venlafaxine XR  37.5 mg Oral Daily   venlafaxine XR  75 mg Oral Daily   Continuous Infusions:  ceFEPime (MAXIPIME) IV 2 g (03/07/23 0515)   lactated ringers 150 mL/hr (03/07/23 0037)   linezolid (ZYVOX) IV 600 mg (03/07/23 0829)   metronidazole 500 mg (03/07/23 1127)   PRN Meds: acetaminophen **OR** acetaminophen, benztropine, diphenhydrAMINE, HYDROmorphone (DILAUDID) injection, ibuprofen, melatonin, morphine injection, ondansetron **OR** ondansetron (ZOFRAN) IV, senna-docusate, sodium chloride flush, Ubrogepant  Allergies:    Allergies  Allergen Reactions   Penicillin G Itching, Rash, Anaphylaxis and Palpitations   Lamictal [Lamotrigine] Rash   Carbamazepine Other (See Comments)    Medication interaction-prograf    Hydrocodone-Acetaminophen Itching   Naproxen Itching   Penicillins    Vancomycin Rash    Macular/blotchy rash at infusion site    Social History:   Social History   Socioeconomic History   Marital status: Single    Spouse name: Not on file   Number of children: 0   Years of education: 9   Highest education level: GED or equivalent  Occupational History   Occupation: disabled  Tobacco Use   Smoking status: Never   Smokeless tobacco: Never  Vaping Use   Vaping Use: Never used  Substance and Sexual Activity   Alcohol use: Not Currently   Drug use: Not  Currently    Comment: pain  managment last used in early april   Sexual activity: Not Currently  Other Topics Concern   Not on file  Social History Narrative   Not on file   Social Determinants of Health   Financial Resource Strain: Low Risk  (10/15/2022)   Overall Financial Resource Strain (CARDIA)    Difficulty of Paying Living Expenses: Not very hard  Food Insecurity: No Food Insecurity (03/06/2023)   Hunger Vital Sign    Worried About Running Out of Food in the Last Year: Never true    Ran Out of Food in the Last Year: Never true  Transportation Needs: No Transportation Needs (03/06/2023)   PRAPARE - Administrator, Civil Service (Medical): No    Lack of Transportation (Non-Medical): No  Physical Activity: Inactive (10/15/2022)   Exercise Vital Sign    Days of Exercise per Week: 0 days    Minutes of Exercise per Session: 0 min  Stress: Stress Concern Present (11/11/2022)   [employer] of Occupational Health - Occupational Stress Questionnaire    Feeling of Stress : To some extent  Social Connections: Moderately Integrated (02/19/2023)   Social Connection and Isolation Panel [NHANES]    Frequency of Communication with Friends and Family: More than three times a week    Frequency of Social Gatherings with Friends and Family: More than three times a week    Attends Religious Services: More than 4 times per year    Active Member of [employer] or Organizations: No    Attends Banker Meetings: Never    Marital Status: Living with partner  Intimate Partner Violence: Not At Risk (03/06/2023)   Humiliation, Afraid, Rape, and Kick questionnaire    Fear of Current or Ex-Partner: No    Emotionally Abused: No    Physically Abused: No    Sexually Abused: No    Family History:    Family History  Problem Relation Age of Onset   Heart disease Mother    Hypertension Mother    Cancer - Other Mother    Bipolar disorder Mother    Heart disease Father    Hypertension Father    Diabetes Father     Parkinson's disease Maternal Grandmother    Cancer Maternal Aunt    Cancer Maternal Uncle    Cancer Maternal Grandfather    Lupus Paternal Grandmother    Hypertension Brother      ROS:  Please see the history of present illness.   All other ROS reviewed and negative.     Physical Exam/Data:   Vitals:   03/07/23 0132 03/07/23 0301 03/07/23 0527 03/07/23 0745  BP: 118/80  108/75 123/89  Pulse:   86 90  Resp:  17 19 18   Temp:   98.4 F (36.9 C) 98.3 F (36.8 C)  TempSrc:      SpO2:   94% 98%  Weight:      Height:        Intake/Output Summary (Last 24 hours) at 03/07/2023 1135 Last data filed at 03/07/2023 0303 Gross per 24 hour  Intake 3058.99 ml  Output --  Net 3058.99 ml      03/06/2023    7:35 AM 01/22/2023   10:29 AM 12/12/2022    4:22 PM  Last 3 Weights  Weight (lbs) 250 lb 252 lb 3.2 oz   Weight (kg) 113.399 kg 114.397 kg      Information is confidential and restricted. Go to Review Flowsheets  to unlock data.     Body mass index is 40.35 kg/m.  General:  Well nourished, well developed, in no acute distress HEENT: normal Neck: no JVD Vascular: No carotid bruits; Distal pulses 2+ bilaterally Cardiac:  normal S1, S2; RRR; no murmur  Lungs:  clear to auscultation bilaterally, no wheezing, rhonchi or rales  Abd: soft, nontender, no hepatomegaly  Ext: no edema Musculoskeletal:  No deformities, BUE and BLE strength normal and equal Skin: warm and dry  Neuro:  CNs 2-12 intact, no focal abnormalities noted Psych:  Normal affect   EKG:  The EKG was personally reviewed and demonstrates:  ST, 102bpm, LAD, RBBB, TWI aVL Telemetry:  Telemetry was personally reviewed and demonstrates: NSR Hr 80-90s  Relevant CV Studies:  Echo 10/2021 1. Left ventricular ejection fraction, by estimation, is 50 to 55%. The  left ventricle has low normal function. The left ventricle has no regional  wall motion abnormalities. Left ventricular diastolic parameters were  normal. The  average left ventricular   global longitudinal strain is -17.6 %. The global longitudinal strain is  normal.   2. Right ventricular systolic function is normal. The right ventricular  size is normal.   3. The mitral valve is normal in structure. No evidence of mitral valve  regurgitation.   4. The aortic valve was not well visualized. Aortic valve regurgitation  is not visualized.   5. Aortic dilatation noted. There is mild dilatation of the aortic root  and of the ascending aorta, measuring 39 mm.   Myoview Lexiscan 09/2021 Narrative & Impression  Pharmacological myocardial perfusion imaging study with no significant  ischemia Normal wall motion, EF estimated at 66% No EKG changes concerning for ischemia at peak stress or in recovery. CT attenuation correction images with no significant coronary calcification or aortic atherosclerosis Low risk scan     Signed, Dossie Arbour, MD, Ph.D [facility]        Laboratory Data:  High Sensitivity Troponin:   Recent Labs  Lab 03/06/23 0918 03/06/23 1337  TROPONINIHS 6 6     Chemistry Recent Labs  Lab 03/06/23 0918 03/06/23 1958 03/07/23 0500  NA 136  --  135  K 4.9  --  4.3  CL 103  --  105  CO2 22  --  23  GLUCOSE 178*  --  187*  BUN 19  --  19  CREATININE 1.05*  --  1.07*  CALCIUM 9.4  --  8.5*  MG  --  1.2*  --   GFRNONAA >60  --  >60  ANIONGAP 11  --  7    Recent Labs  Lab 03/06/23 0918  PROT 7.8  ALBUMIN 4.3  AST 34  ALT 25  ALKPHOS 67  BILITOT 1.2   Lipids No results for input(s): "CHOL", "TRIG", "HDL", "LABVLDL", "LDLCALC", "CHOLHDL" in the last 168 hours.  Hematology Recent Labs  Lab 03/06/23 0918 03/07/23 0500  WBC 11.5* 9.7  RBC 4.94 3.98  HGB 14.4 11.7*  HCT 43.0 35.0*  MCV 87.0 87.9  MCH 29.1 29.4  MCHC 33.5 33.4  RDW 14.4 14.6  PLT 148* 122*   Thyroid No results for input(s): "TSH", "FREET4" in the last 168 hours.  BNPNo results for input(s): "BNP", "PROBNP" in the last 168 hours.   DDimer No results for input(s): "DDIMER" in the last 168 hours.   Radiology/Studies:  CT CHEST ABDOMEN PELVIS W CONTRAST  Result Date: 03/06/2023 CLINICAL DATA:  Sepsis EXAM: CT CHEST, ABDOMEN, AND PELVIS  WITH CONTRAST TECHNIQUE: Multidetector CT imaging of the chest, abdomen and pelvis was performed following the standard protocol during bolus administration of intravenous contrast. RADIATION DOSE REDUCTION: This exam was performed according to the departmental dose-optimization program which includes automated exposure control, adjustment of the mA and/or kV according to patient size and/or use of iterative reconstruction technique. CONTRAST:  OMNIPAQUE IOHEXOL 300 MG/ML  SOLN COMPARISON:  None Available. FINDINGS: CT CHEST FINDINGS Cardiovascular: Small pericardial effusion measuring 6 mm in depth. There is inflammatory stranding within the pericardial fat (image 38/2 ) Mediastinum/Nodes: No axillary or supraclavicular adenopathy. No mediastinal or hilar adenopathy. No pericardial fluid. Esophagus normal. Lungs/Pleura: No airspace disease. No pneumothorax. No pleural fluid. Musculoskeletal: No aggressive osseous lesion. CT ABDOMEN AND PELVIS FINDINGS Hepatobiliary: No focal hepatic lesion. Postcholecystectomy. Common bile duct mildly dilated 12 mm following cholecystectomy no biliary dilatation. Pancreas: Pancreas is normal. No ductal dilatation. No pancreatic inflammation. Spleen: Normal spleen Adrenals/urinary tract: Adrenal glands normal. Bilateral renal cortical scarring. 15 mm simple fluid attenuation LEFT renal cyst on postcontrast imaging is present. Small nonenhancing cyst of the RIGHT kidney. No enhancing renal cortical lesion. The ureters and bladder normal. Stomach/Bowel: The stomach, duodenum, and small bowel normal. The colon and rectosigmoid colon are normal. Vascular/Lymphatic: Abdominal aorta is normal caliber. There is no retroperitoneal or periportal lymphadenopathy. No pelvic  lymphadenopathy. Reproductive: Uterus and adnexa unremarkable. Other: No free fluid. Musculoskeletal: No aggressive osseous lesion. IMPRESSION: CHEST IMPRESSION: 1. Small pericardial effusion and inflammation in the pericardial fat. Recommend correlation for pericarditis. 2. No acute pulmonary findings. PELVIS IMPRESSION: 1. No acute findings in the abdomen pelvis. 2. Bilateral renal cortical scarring and benign Bosniak 1 renal cysts. No follow-up recommended for benign renal lesion. Electronically Signed   By: Genevive Bi M.D.   On: 03/06/2023 15:36   DG Chest 2 View  Result Date: 03/06/2023 CLINICAL DATA:  Chest pain EXAM: CHEST - 2 VIEW COMPARISON:  Chest radiograph 10/21/2022. FINDINGS: The left chest wall port is stable in position. The cardiomediastinal silhouette is stable. There is no focal consolidation or pulmonary edema. There is no pleural effusion or pneumothorax. Pleural thickening along the right lateral chest wall is unchanged. There is no acute osseous abnormality. IMPRESSION: Stable chest with no radiographic evidence of acute cardiopulmonary process. Electronically Signed   By: Lesia Hausen M.D.   On: 03/06/2023 08:03     Assessment and Plan:   Chest pain Pericardial effusion - admitted with sepsis of unclear etiology. she is on immunosuppressants s/p liver transplant - HS trop 6>6 - EKG with no ischemic changes - Chest CT showed small pericardial effusion measuring 6mm with inflammation in the pericardial far - echo ordered - check sed rate and CRP - chest pain is more atypical, pleuritic in nature. Agree with possible pericarditis.  Sepsis - found to have leukocytosis, tachycardia, tachypnea, elevated LA - UA with small leukocytes - BC pending - she is on immunosuppressants as above - antibiotics per IM  Prolonged Qt - chronic  HTN - lisinopril 20mg  daily  HLD - LDL 19 in 2023 - continue Crestor, Zetia 10mg  daily and Vascepa  For questions or updates,  please contact [facility] Please consult [email or web address] for contact info under    Signed, Andy Allende David Stall, PA-C  03/07/2023 11:35 AM

## 2023-03-08 DIAGNOSIS — Z79899 Other long term (current) drug therapy: Secondary | ICD-10-CM

## 2023-03-08 DIAGNOSIS — E782 Mixed hyperlipidemia: Secondary | ICD-10-CM | POA: Diagnosis not present

## 2023-03-08 DIAGNOSIS — E119 Type 2 diabetes mellitus without complications: Secondary | ICD-10-CM | POA: Diagnosis not present

## 2023-03-08 DIAGNOSIS — I301 Infective pericarditis: Secondary | ICD-10-CM

## 2023-03-08 DIAGNOSIS — R9431 Abnormal electrocardiogram [ECG] [EKG]: Secondary | ICD-10-CM | POA: Diagnosis not present

## 2023-03-08 DIAGNOSIS — A419 Sepsis, unspecified organism: Secondary | ICD-10-CM | POA: Diagnosis not present

## 2023-03-08 DIAGNOSIS — Z794 Long term (current) use of insulin: Secondary | ICD-10-CM | POA: Diagnosis not present

## 2023-03-08 DIAGNOSIS — I10 Essential (primary) hypertension: Secondary | ICD-10-CM | POA: Diagnosis not present

## 2023-03-08 DIAGNOSIS — I300 Acute nonspecific idiopathic pericarditis: Secondary | ICD-10-CM | POA: Diagnosis not present

## 2023-03-08 DIAGNOSIS — R072 Precordial pain: Secondary | ICD-10-CM | POA: Diagnosis not present

## 2023-03-08 DIAGNOSIS — I309 Acute pericarditis, unspecified: Secondary | ICD-10-CM | POA: Insufficient documentation

## 2023-03-08 DIAGNOSIS — F3178 Bipolar disorder, in full remission, most recent episode mixed: Secondary | ICD-10-CM | POA: Diagnosis not present

## 2023-03-08 DIAGNOSIS — M797 Fibromyalgia: Secondary | ICD-10-CM | POA: Diagnosis not present

## 2023-03-08 LAB — CULTURE, BLOOD (ROUTINE X 2)

## 2023-03-08 LAB — RESPIRATORY PANEL BY PCR

## 2023-03-08 LAB — LIPID PANEL
Cholesterol: 66 mg/dL (ref 0–200)
HDL: 30 mg/dL — ABNORMAL LOW (ref >40)
LDL Cholesterol: 19 mg/dL (ref 0–99)
Total CHOL/HDL Ratio: 2.2 RATIO
Triglycerides: 87 mg/dL (ref <150)
VLDL: 17 mg/dL (ref 0–40)

## 2023-03-08 LAB — C-REACTIVE PROTEIN: CRP: 10.6 mg/dL — ABNORMAL HIGH (ref <1.0)

## 2023-03-08 LAB — GLUCOSE, CAPILLARY
Glucose-Capillary: 148 mg/dL — ABNORMAL HIGH (ref 70–99)
Glucose-Capillary: 250 mg/dL — ABNORMAL HIGH (ref 70–99)
Glucose-Capillary: 233 mg/dL — ABNORMAL HIGH (ref 70–99)
Glucose-Capillary: 190 mg/dL — ABNORMAL HIGH (ref 70–99)

## 2023-03-08 LAB — SEDIMENTATION RATE: Sed Rate: 60 mm/hr — ABNORMAL HIGH (ref 0–20)

## 2023-03-08 LAB — HEMOGLOBIN A1C
Hgb A1c MFr Bld: 8.8 % — ABNORMAL HIGH (ref 4.8–5.6)
Mean Plasma Glucose: 206 mg/dL

## 2023-03-08 MED ORDER — INSULIN ASPART 100 UNIT/ML IJ SOLN
3.0000 [IU] | Freq: Three times a day (TID) | INTRAMUSCULAR | Status: DC
  Administered 2023-03-08 – 2023-03-11 (×10): 3 [IU] via SUBCUTANEOUS
  Filled 2023-03-08 (×10): qty 1

## 2023-03-08 MED ORDER — SUMATRIPTAN SUCCINATE 50 MG PO TABS: 50.0000 mg | ORAL_TABLET | ORAL | Status: DC | PRN

## 2023-03-08 MED ORDER — PANTOPRAZOLE SODIUM 40 MG PO TBEC
40.0000 mg | DELAYED_RELEASE_TABLET | Freq: Every day | ORAL | Status: DC
  Administered 2023-03-08 – 2023-03-11 (×4): 40 mg via ORAL
  Filled 2023-03-08 (×4): qty 1

## 2023-03-08 MED ORDER — HYDROMORPHONE HCL 1 MG/ML IJ SOLN
1.0000 mg | INTRAMUSCULAR | Status: AC | PRN
  Administered 2023-03-08 – 2023-03-10 (×9): 1 mg via INTRAVENOUS
  Filled 2023-03-08 (×9): qty 1

## 2023-03-08 NOTE — Progress Notes (Signed)
Rounding Note    Patient Name: Alison [name] Date of Encounter: 03/08/2023  [facility] Cardiologist: Debbe Odea, MD   Subjective   Continues to have chest pain.  7/10 today.  Was 8/10 yesterday.  Inpatient Medications    Scheduled Meds:  baclofen  10 mg Oral BID   Chlorhexidine Gluconate Cloth  6 each Topical Daily   colchicine  0.6 mg Oral BID   enoxaparin (LOVENOX) injection  55 mg Subcutaneous Q24H   ezetimibe  10 mg Oral Daily   icosapent Ethyl  2 g Oral BID   insulin aspart  0-15 Units Subcutaneous TID WC   insulin aspart  0-5 Units Subcutaneous QHS   insulin aspart  3 Units Subcutaneous TID WC   insulin glargine-yfgn  40 Units Subcutaneous QHS   lisinopril  20 mg Oral Daily   lurasidone  20 mg Oral Q supper   melatonin  10 mg Oral QHS   mirabegron ER  50 mg Oral Daily   mirtazapine  15 mg Oral QHS   oxyCODONE  10 mg Oral Q12H   pantoprazole  40 mg Oral Daily   pregabalin  200 mg Oral BID   rosuvastatin  10 mg Oral Daily   sodium chloride flush  10-40 mL Intracatheter Q12H   tacrolimus  1 mg Oral q morning   venlafaxine XR  37.5 mg Oral Daily   venlafaxine XR  75 mg Oral Daily   Continuous Infusions:  ceFEPime (MAXIPIME) IV 2 g (03/08/23 0308)   linezolid (ZYVOX) IV 600 mg (03/08/23 0859)   metronidazole 500 mg (03/08/23 0024)   PRN Meds: acetaminophen **OR** acetaminophen, benztropine, HYDROmorphone (DILAUDID) injection, melatonin, ondansetron **OR** ondansetron (ZOFRAN) IV, senna-docusate, sodium chloride flush, Ubrogepant   Vital Signs    Vitals:   03/07/23 2209 03/08/23 0005 03/08/23 0350 03/08/23 0751  BP: 124/63 100/64 117/68 101/76  Pulse: 88 84 80 77  Resp: 18 18 16 20   Temp: 100.1 F (37.8 C) 99 F (37.2 C) 98.5 F (36.9 C) 98.8 F (37.1 C)  TempSrc: Oral Oral Oral Oral  SpO2: 90% 92% 90% 99%  Weight:      Height:        Intake/Output Summary (Last 24 hours) at 03/08/2023 1027 Last data filed at 03/08/2023  0700 Gross per 24 hour  Intake 1440 ml  Output --  Net 1440 ml      03/06/2023    7:35 AM 01/22/2023   10:29 AM 12/12/2022    4:22 PM  Last 3 Weights  Weight (lbs) 250 lb 252 lb 3.2 oz   Weight (kg) 113.399 kg 114.397 kg      Information is confidential and restricted. Go to Review Flowsheets to unlock data.      Telemetry    Sinus rhythm - Personally Reviewed  ECG    N/a - Personally Reviewed  Physical Exam   GEN: No acute distress.   Neck: No JVD Cardiac: RRR, no murmurs, rubs, or gallops.  Respiratory: Clear to auscultation bilaterally. GI: Soft, nontender, non-distended  MS: No edema; No deformity. Neuro:  Nonfocal  Psych: Normal affect   Labs    High Sensitivity Troponin:   Recent Labs  Lab 03/06/23 0918 03/06/23 1337  TROPONINIHS 6 6     Chemistry Recent Labs  Lab 03/06/23 0918 03/06/23 1958 03/07/23 0500  NA 136  --  135  K 4.9  --  4.3  CL 103  --  105  CO2 22  --  23  GLUCOSE 178*  --  187*  BUN 19  --  19  CREATININE 1.05*  --  1.07*  CALCIUM 9.4  --  8.5*  MG  --  1.2*  --   PROT 7.8  --   --   ALBUMIN 4.3  --   --   AST 34  --   --   ALT 25  --   --   ALKPHOS 67  --   --   BILITOT 1.2  --   --   GFRNONAA >60  --  >60  ANIONGAP 11  --  7    Lipids  Recent Labs  Lab 03/08/23 0700  CHOL 66  TRIG 87  HDL 30*  LDLCALC 19  CHOLHDL 2.2    Hematology Recent Labs  Lab 03/06/23 0918 03/07/23 0500  WBC 11.5* 9.7  RBC 4.94 3.98  HGB 14.4 11.7*  HCT 43.0 35.0*  MCV 87.0 87.9  MCH 29.1 29.4  MCHC 33.5 33.4  RDW 14.4 14.6  PLT 148* 122*   Thyroid No results for input(s): "TSH", "FREET4" in the last 168 hours.  BNPNo results for input(s): "BNP", "PROBNP" in the last 168 hours.  DDimer No results for input(s): "DDIMER" in the last 168 hours.   Radiology    ECHOCARDIOGRAM COMPLETE  Result Date: 03/07/2023    ECHOCARDIOGRAM REPORT   Patient Name:   Alison [name] Date of Exam: 03/07/2023 Medical Rec #:  [id number]          Height:       66.0 in Accession #:    [id number]        Weight:       250.0 lb Date of Birth:  14-Nov-1975         BSA:          2.199 m Patient Age:    47 years          BP:           145/84 mmHg Patient Gender: F                 HR:           99 bpm. Exam Location:  [facility] Procedure: 2D Echo, Cardiac Doppler and Color Doppler Indications:     Chest Pain  History:         Patient has prior history of Echocardiogram examinations, most                  recent 10/17/2021. Signs/Symptoms:Chest Pain and Murmur; Risk                  Factors:Hypertension, Sleep Apnea, Diabetes and Dyslipidemia.                  CKD, s/p Liver transplant.  Sonographer:     Mikki Harbor Referring Phys:  [id number] AMY N COX Diagnosing Phys: Lorine Bears MD  Sonographer Comments: Patient is obese. IMPRESSIONS  1. Left ventricular ejection fraction, by estimation, is 55 to 60%. The left ventricle has normal function. The left ventricle has no regional wall motion abnormalities. Left ventricular diastolic parameters were normal.  2. Right ventricular systolic function is normal. The right ventricular size is normal. There is normal pulmonary artery systolic pressure.  3. Left atrial size was mildly dilated.  4. The mitral valve is normal in structure. No evidence of mitral valve regurgitation. No evidence of mitral stenosis.  5. The aortic valve is normal  in structure. Aortic valve regurgitation is not visualized. No aortic stenosis is present. FINDINGS  Left Ventricle: Left ventricular ejection fraction, by estimation, is 55 to 60%. The left ventricle has normal function. The left ventricle has no regional wall motion abnormalities. The left ventricular internal cavity size was normal in size. There is  borderline left ventricular hypertrophy. Left ventricular diastolic parameters were normal. Right Ventricle: The right ventricular size is normal. No increase in right ventricular wall thickness. Right ventricular systolic function is normal.  There is normal pulmonary artery systolic pressure. The tricuspid regurgitant velocity is 2.57 m/s, and  with an assumed right atrial pressure of 5 mmHg, the estimated right ventricular systolic pressure is 31.4 mmHg. Left Atrium: Left atrial size was mildly dilated. Right Atrium: Right atrial size was normal in size. Pericardium: There is no evidence of pericardial effusion. Mitral Valve: The mitral valve is normal in structure. No evidence of mitral valve regurgitation. No evidence of mitral valve stenosis. MV peak gradient, 4.7 mmHg. The mean mitral valve gradient is 2.0 mmHg. Tricuspid Valve: The tricuspid valve is normal in structure. Tricuspid valve regurgitation is mild . No evidence of tricuspid stenosis. Aortic Valve: The aortic valve is normal in structure. Aortic valve regurgitation is not visualized. No aortic stenosis is present. Aortic valve mean gradient measures 7.0 mmHg. Aortic valve peak gradient measures 12.0 mmHg. Aortic valve area, by VTI measures 2.04 cm. Pulmonic Valve: The pulmonic valve was normal in structure. Pulmonic valve regurgitation is not visualized. No evidence of pulmonic stenosis. Aorta: The aortic root is normal in size and structure. Venous: The inferior vena cava was not well visualized. IAS/Shunts: No atrial level shunt detected by color flow Doppler.  LEFT VENTRICLE PLAX 2D LVIDd:         3.90 cm   Diastology LVIDs:         2.50 cm   LV e' medial:    8.70 cm/s LV PW:         1.20 cm   LV E/e' medial:  10.1 LV IVS:        1.10 cm   LV e' lateral:   11.60 cm/s LVOT diam:     2.00 cm   LV E/e' lateral: 7.5 LV SV:         69 LV SV Index:   31 LVOT Area:     3.14 cm  RIGHT VENTRICLE RV Basal diam:  3.35 cm RV Mid diam:    3.80 cm RV S prime:     10.70 cm/s TAPSE (M-mode): 2.4 cm LEFT ATRIUM             Index        RIGHT ATRIUM           Index LA diam:        3.90 cm 1.77 cm/m   RA Area:     18.80 cm LA Vol (A2C):   45.2 ml 20.56 ml/m  RA Volume:   50.30 ml  22.88 ml/m LA  Vol (A4C):   38.7 ml 17.60 ml/m LA Biplane Vol: 41.0 ml 18.65 ml/m  AORTIC VALVE                     PULMONIC VALVE AV Area (Vmax):    2.23 cm      PV Vmax:       1.23 m/s AV Area (Vmean):   2.08 cm      PV Peak grad:  6.1 mmHg AV  Area (VTI):     2.04 cm AV Vmax:           173.00 cm/s AV Vmean:          128.000 cm/s AV VTI:            0.339 m AV Peak Grad:      12.0 mmHg AV Mean Grad:      7.0 mmHg LVOT Vmax:         123.00 cm/s LVOT Vmean:        84.800 cm/s LVOT VTI:          0.220 m LVOT/AV VTI ratio: 0.65  AORTA Ao Root diam: 2.70 cm MITRAL VALVE               TRICUSPID VALVE MV Area (PHT): 4.08 cm    TR Peak grad:   26.4 mmHg MV Area VTI:   3.47 cm    TR Vmax:        257.00 cm/s MV Peak grad:  4.7 mmHg MV Mean grad:  2.0 mmHg    SHUNTS MV Vmax:       1.08 m/s    Systemic VTI:  0.22 m MV Vmean:      61.7 cm/s   Systemic Diam: 2.00 cm MV Decel Time: 186 msec MV E velocity: 87.50 cm/s MV A velocity: 55.90 cm/s MV E/A ratio:  1.57 Lorine Bears MD Electronically signed by Lorine Bears MD Signature Date/Time: 03/07/2023/3:17:23 PM    Final    CT CHEST ABDOMEN PELVIS W CONTRAST  Result Date: 03/06/2023 CLINICAL DATA:  Sepsis EXAM: CT CHEST, ABDOMEN, AND PELVIS WITH CONTRAST TECHNIQUE: Multidetector CT imaging of the chest, abdomen and pelvis was performed following the standard protocol during bolus administration of intravenous contrast. RADIATION DOSE REDUCTION: This exam was performed according to the departmental dose-optimization program which includes automated exposure control, adjustment of the mA and/or kV according to patient size and/or use of iterative reconstruction technique. CONTRAST:  OMNIPAQUE IOHEXOL 300 MG/ML  SOLN COMPARISON:  None Available. FINDINGS: CT CHEST FINDINGS Cardiovascular: Small pericardial effusion measuring 6 mm in depth. There is inflammatory stranding within the pericardial fat (image 38/2 ) Mediastinum/Nodes: No axillary or supraclavicular adenopathy. No  mediastinal or hilar adenopathy. No pericardial fluid. Esophagus normal. Lungs/Pleura: No airspace disease. No pneumothorax. No pleural fluid. Musculoskeletal: No aggressive osseous lesion. CT ABDOMEN AND PELVIS FINDINGS Hepatobiliary: No focal hepatic lesion. Postcholecystectomy. Common bile duct mildly dilated 12 mm following cholecystectomy no biliary dilatation. Pancreas: Pancreas is normal. No ductal dilatation. No pancreatic inflammation. Spleen: Normal spleen Adrenals/urinary tract: Adrenal glands normal. Bilateral renal cortical scarring. 15 mm simple fluid attenuation LEFT renal cyst on postcontrast imaging is present. Small nonenhancing cyst of the RIGHT kidney. No enhancing renal cortical lesion. The ureters and bladder normal. Stomach/Bowel: The stomach, duodenum, and small bowel normal. The colon and rectosigmoid colon are normal. Vascular/Lymphatic: Abdominal aorta is normal caliber. There is no retroperitoneal or periportal lymphadenopathy. No pelvic lymphadenopathy. Reproductive: Uterus and adnexa unremarkable. Other: No free fluid. Musculoskeletal: No aggressive osseous lesion. IMPRESSION: CHEST IMPRESSION: 1. Small pericardial effusion and inflammation in the pericardial fat. Recommend correlation for pericarditis. 2. No acute pulmonary findings. PELVIS IMPRESSION: 1. No acute findings in the abdomen pelvis. 2. Bilateral renal cortical scarring and benign Bosniak 1 renal cysts. No follow-up recommended for benign renal lesion. Electronically Signed   By: Genevive Bi M.D.   On: 03/06/2023 15:36    Cardiac Studies   Echo 03/07/23: 1.  Left ventricular ejection fraction, by estimation, is 55 to 60%. The  left ventricle has normal function. The left ventricle has no regional  wall motion abnormalities. Left ventricular diastolic parameters were  normal.   2. Right ventricular systolic function is normal. The right ventricular  size is normal. There is normal pulmonary artery systolic  pressure.   3. Left atrial size was mildly dilated.   4. The mitral valve is normal in structure. No evidence of mitral valve  regurgitation. No evidence of mitral stenosis.   5. The aortic valve is normal in structure. Aortic valve regurgitation is  not visualized. No aortic stenosis is present.   Patient Profile     47 y.o. female with Sjogren's syndrome, prior liver transplant on Prograf, lymphoma s/p chemotherapy in 2020, GERD, and prolonged QT admitted with pericarditis.  Assessment & Plan    # Pericarditis: She continues to have chest pain.  It is slightly improved from yesterday, but still 7 out of 10.  She is unable to tolerate naproxen.  Aspirin could also be helpful and she has tolerated it in the past.  However it seems to have an interaction with Prograf so we will not start it.  It can increase the levels.  Continue with colchicine.  Plan to treat for 3 months.  Will check a sed rate, however this may not be elevated in the setting of her immunosuppressive therapy.       For questions or updates, please contact [facility] Please consult [email or web address] for contact info under        Signed, Chilton Si, MD  03/08/2023, 10:27 AM

## 2023-03-08 NOTE — TOC Initial Note (Addendum)
Transition of Care [facility]) - Initial/Assessment Note    Patient Details  Name: Alison [name] MRN: [id number] Date of Birth: September 05, 1976  Transition of Care [facility]) CM/SW Contact:    Allena Katz, LCSW Phone Number: 03/08/2023, 3:07 PM  Clinical Narrative:  Pt admitted with Sepsis. Pt lives at home with her partner. Pt active with Dr Zane Herald for primary care. Blood cultures pending currently for patient, No PT/OT needs at this time.TOC will follow for care plan updates and changes.                       Patient Goals and CMS Choice            Expected Discharge Plan and Services                                              Prior Living Arrangements/Services                       Activities of Daily Living Home Assistive Devices/Equipment: Cane (specify quad or straight) (quad cane) ADL Screening (condition at time of admission) Patient's cognitive ability adequate to safely complete daily activities?: Yes Is the patient deaf or have difficulty hearing?: No Does the patient have difficulty seeing, even when wearing glasses/contacts?: No Does the patient have difficulty concentrating, remembering, or making decisions?: No Patient able to express need for assistance with ADLs?: Yes Does the patient have difficulty dressing or bathing?: No Independently performs ADLs?: Yes (appropriate for developmental age) Does the patient have difficulty walking or climbing stairs?: Yes Weakness of Legs: Both Weakness of Arms/Hands: Both  Permission Sought/Granted                  Emotional Assessment              Admission diagnosis:  Sepsis (HCC) [A41.9] Sepsis, due to unspecified organism, unspecified whether acute organ dysfunction present [facility]) [A41.9] Patient Active Problem List   Diagnosis Date Noted   Acute infective pericarditis 03/08/2023   Sepsis (HCC) 03/06/2023   Chest pain 03/06/2023   CKD (chronic kidney disease), stage II  01/22/2023   Insulin dependent type 2 diabetes mellitus (HCC) 01/22/2023   Overactive bladder 01/22/2023   High risk medication use 12/12/2022   Sjogren's syndrome (HCC) 05/27/2022   Menorrhagia with irregular cycle    Akathisia 10/24/2021   At risk for prolonged QT interval syndrome 09/12/2021   Prolonged Q-T interval on ECG 08/29/2021   Insomnia 07/20/2021   Bipolar disorder, in full remission, most recent episode mixed (HCC) 07/20/2021   GAD (generalized anxiety disorder) 05/09/2021   Opioid dependence with opioid-induced disorder (HCC) 12/20/2020   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   Carpal tunnel syndrome, left 07/26/2019   Cubital tunnel syndrome on left 07/26/2019   Umbilical hernia without obstruction and without gangrene 06/17/2019   Lumbar spondylosis 06/02/2019   Obesity (BMI 35.0-39.9 without comorbidity) 06/02/2019   Goals of care, counseling/discussion 05/18/2019   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   Fibromyalgia 03/23/2019   Systemic lupus erythematosus (SLE) in adult [facility]) 03/23/2019   Essential hypertension 03/23/2019   Mass of left kidney 03/23/2019   Nausea without vomiting 03/23/2019   Hyperlipidemia 03/23/2019   Osteoarthritis 03/23/2019   Trigeminal neuralgia 03/23/2019   Nephrolithiasis 03/23/2019   Migraine 03/23/2019   OSA on  CPAP 03/23/2019   Fibrocystic breast 03/23/2019   Heart murmur 03/23/2019   Major depressive disorder, recurrent (HCC) 03/18/2017   Morbid obesity (HCC) 03/18/2017   Neuropathic pain 11/19/2016   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HCC) 01/13/2015   Lumbar disc disease with radiculopathy 07/26/2013   S/P liver transplant (HCC) 07/26/2013   Facial nerve disorder 06/29/2012   Low back pain 12/26/2010   PCP:  [name], FNP Pharmacy:   [facility] [address] [place] - [address] [place] [address] Phone: [phone] Fax: [phone]     Social  Determinants of Health (SDOH) Social History: SDOH Screenings   Food Insecurity: No Food Insecurity (03/06/2023)  Housing: Low Risk  (03/06/2023)  Transportation Needs: No Transportation Needs (03/06/2023)  Utilities: Not At Risk (03/06/2023)  Alcohol Screen: Low Risk  (11/11/2022)  Depression (PHQ2-9): High Risk (01/22/2023)  Financial Resource Strain: Low Risk  (10/15/2022)  Physical Activity: Inactive (10/15/2022)  Social Connections: Moderately Integrated (02/19/2023)  Stress: Stress Concern Present (11/11/2022)  Tobacco Use: Low Risk  (03/06/2023)   SDOH Interventions:     Readmission Risk Interventions    03/08/2023    3:07 PM  Readmission Risk Prevention Plan  Transportation Screening Complete  PCP or Specialist Appt within 3-5 Days Complete  HRI or Home Care Consult Complete  Social Work Consult for Recovery Care Planning/Counseling Complete  Palliative Care Screening Complete  Medication Review [employer]) Complete

## 2023-03-08 NOTE — Progress Notes (Signed)
Progress Note   [name] ZOX:[id number] DOB: Sep 17, 1976 DOA: 03/06/2023     2 DOS: the patient was seen and examined on 03/08/2023   Brief hospital course: Ms. 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 She was febrile started on broad spectrum empiric antibiotics. CT chest no PE, has small pleural effusion. Cardiology consulted for pericarditis, advised colchicine therapy. Persistent fevers noted, ordered respiratory panel.  Assessment and Plan: * Sepsis (HCC) She had fever, tachycardia, tachypnea, leukocytosis, lactic acid high Etiology/source of infection unknown at this time. Respiratory panel ordered. Urine, blood cultures x 2 pending.  Continue to monitor vitals closely. Given her immunosuppressant use, continue broad spectrum antibiotics.  Chest pain Possible pericarditis, CT chest showed pericardial effusion. Troponin normal. EKG sinus, no new changes. Echo EF 55-60%, no valve abnl. Cardiology follow up appreciated. She has allergy to Naproxen, NSAIDS. Started Colchicine by Cardiology team.  Insulin dependent type 2 diabetes mellitus (HCC) Home long-acting insulin 40 units nightly resumed Insulin SSI with agents coverage ordered Hold oral hypoglycemics.  Prolonged Q-T interval on ECG Mag low replaced with IV supplements.  Hold QT prolonging drugs.  Bipolar  disorder, in full remission, most recent episode mixed (HCC) Venlafaxine 70 mg +37.5 mg daily with breakfast resumed, mirtazapine 15 mg nightly resumed Lurasidone 20 mg with supper resumed  Insomnia Continue Melatonin 5 mg nightly as needed for sleep.  Morbid obesity (HCC) Patient has BMI 40.35. This complicates overall care and prognosis.  Discussed diet, exercise and weight reduction.  Hyperlipidemia Continue Ezetimibe 10 mg daily.  Essential hypertension BP stable. Continue Lisinopril 20 mg daily.  Fibromyalgia Continue oxycodone 10 mg p.o. twice daily resumed. Dilaudid for severe pain. Ubrogepant 50 mg daily prn for migraine  DVT prophylaxis- Lovenox.  Subjective: Patient is seen and examined today morning. She is lying in bed. Asks for increase in pain med dose. She had fever last night Tmax 103. Respiratory panel ordered. Advised out of bed to chair.  Physical Exam: Vitals:   03/08/23 0005 03/08/23 0350 03/08/23 0751 03/08/23 1146  BP: 100/64 117/68 101/76 100/61  Pulse: 84 80 77 90  Resp: 18 16 20 16   Temp: 99 F (37.2 C) 98.5 F (36.9 C) 98.8 F (37.1 C) 99.3 F (37.4 C)  TempSrc: Oral Oral Oral   SpO2: 92% 90% 99% 91%  Weight:      Height:       General - Middle aged morbidly obese Caucasian female, no acute distress. Lungs -distant breath sounds, basal rales. Heart- S1, S2 heard, no murmurs. Abdomen - soft obese, bowel sounds good. Neuro - Alert, awake and oriented, non focal.  Data Reviewed:  CBC, BMP, Echo, cultures  Family Communication: Patient understands and agrees with above plan.  Disposition: Status is: Inpatient Remains inpatient appropriate because: ongoing chest pain need cardiology eval, work up of sepsis  Planned Discharge Destination: Home    Time spent: 45 minutes  Author: Marcelino Duster, MD 03/08/2023 1:34 PM  For on call review [email or web address].

## 2023-03-09 DIAGNOSIS — E119 Type 2 diabetes mellitus without complications: Secondary | ICD-10-CM | POA: Diagnosis not present

## 2023-03-09 DIAGNOSIS — M797 Fibromyalgia: Secondary | ICD-10-CM | POA: Diagnosis not present

## 2023-03-09 DIAGNOSIS — Z79899 Other long term (current) drug therapy: Secondary | ICD-10-CM | POA: Diagnosis not present

## 2023-03-09 DIAGNOSIS — A419 Sepsis, unspecified organism: Secondary | ICD-10-CM | POA: Diagnosis not present

## 2023-03-09 DIAGNOSIS — Z794 Long term (current) use of insulin: Secondary | ICD-10-CM | POA: Diagnosis not present

## 2023-03-09 DIAGNOSIS — I10 Essential (primary) hypertension: Secondary | ICD-10-CM | POA: Diagnosis not present

## 2023-03-09 DIAGNOSIS — I301 Infective pericarditis: Secondary | ICD-10-CM | POA: Diagnosis not present

## 2023-03-09 DIAGNOSIS — R9431 Abnormal electrocardiogram [ECG] [EKG]: Secondary | ICD-10-CM | POA: Diagnosis not present

## 2023-03-09 DIAGNOSIS — R072 Precordial pain: Secondary | ICD-10-CM

## 2023-03-09 DIAGNOSIS — I300 Acute nonspecific idiopathic pericarditis: Secondary | ICD-10-CM

## 2023-03-09 DIAGNOSIS — E782 Mixed hyperlipidemia: Secondary | ICD-10-CM | POA: Diagnosis not present

## 2023-03-09 DIAGNOSIS — F3178 Bipolar disorder, in full remission, most recent episode mixed: Secondary | ICD-10-CM | POA: Diagnosis not present

## 2023-03-09 LAB — CBC
HCT: 34.7 % — ABNORMAL LOW (ref 36.0–46.0)
Hemoglobin: 11.6 g/dL — ABNORMAL LOW (ref 12.0–15.0)
MCH: 29.1 pg (ref 26.0–34.0)
MCHC: 33.4 g/dL (ref 30.0–36.0)
MCV: 87.2 fL (ref 80.0–100.0)
Platelets: 141 10*3/uL — ABNORMAL LOW (ref 150–400)
RBC: 3.98 MIL/uL (ref 3.87–5.11)
RDW: 14.4 % (ref 11.5–15.5)
WBC: 8.3 10*3/uL (ref 4.0–10.5)
nRBC: 0 % (ref 0.0–0.2)

## 2023-03-09 LAB — GLUCOSE, CAPILLARY
Glucose-Capillary: 171 mg/dL — ABNORMAL HIGH (ref 70–99)
Glucose-Capillary: 222 mg/dL — ABNORMAL HIGH (ref 70–99)
Glucose-Capillary: 205 mg/dL — ABNORMAL HIGH (ref 70–99)
Glucose-Capillary: 168 mg/dL — ABNORMAL HIGH (ref 70–99)

## 2023-03-09 LAB — BASIC METABOLIC PANEL
Anion gap: 9 (ref 5–15)
BUN: 16 mg/dL (ref 6–20)
CO2: 23 mmol/L (ref 22–32)
Calcium: 8.5 mg/dL — ABNORMAL LOW (ref 8.9–10.3)
Chloride: 104 mmol/L (ref 98–111)
Creatinine, Ser: 0.98 mg/dL (ref 0.44–1.00)
GFR, Estimated: >60 mL/min (ref >60)
Glucose, Bld: 180 mg/dL — ABNORMAL HIGH (ref 70–99)
Potassium: 4.1 mmol/L (ref 3.5–5.1)
Sodium: 136 mmol/L (ref 135–145)

## 2023-03-09 LAB — MAGNESIUM: Magnesium: 1.3 mg/dL — ABNORMAL LOW (ref 1.7–2.4)

## 2023-03-09 LAB — TSH: TSH: 4.878 u[IU]/mL — ABNORMAL HIGH (ref 0.350–4.500)

## 2023-03-09 LAB — CULTURE, BLOOD (ROUTINE X 2)
Culture: NO GROWTH
Special Requests: ADEQUATE

## 2023-03-09 MED ORDER — IBUPROFEN 400 MG PO TABS
400.0000 mg | ORAL_TABLET | Freq: Four times a day (QID) | ORAL | Status: DC
  Administered 2023-03-09 – 2023-03-11 (×9): 400 mg via ORAL
  Filled 2023-03-09 (×9): qty 1

## 2023-03-09 MED ORDER — MAGNESIUM SULFATE 50 % IJ SOLN
4.0000 g | Freq: Once | INTRAMUSCULAR | Status: DC
  Filled 2023-03-09: qty 8

## 2023-03-09 MED ORDER — IBUPROFEN 400 MG PO TABS: 400.0000 mg | ORAL_TABLET | Freq: Four times a day (QID) | ORAL | Status: DC | PRN

## 2023-03-09 MED ORDER — MAGNESIUM SULFATE 4 GM/100ML IV SOLN
4.0000 g | Freq: Once | INTRAVENOUS | Status: AC
  Administered 2023-03-09: 4 g via INTRAVENOUS
  Filled 2023-03-09: qty 100

## 2023-03-09 NOTE — Progress Notes (Signed)
  Progress Note   [name] ZOX:[id number] DOB: Jun 27, 1976 DOA: 03/06/2023     3 DOS: the patient was seen and examined on 03/09/2023   Brief hospital course: 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 She was febrile started on broad spectrum empiric antibiotics. CT chest no PE, has small pleural effusion. Cardiology consulted for pericarditis, advised colchicine therapy. Respiratory panel negative. Fever better. Has persistent chest pain, started motrin.  Assessment and Plan: * Sepsis (HCC) She had fever, tachycardia, tachypnea, leukocytosis, lactic acid high. Respiratory panel negative. Urine, blood cultures x 2 negative so far.  Continue to monitor vitals closely. Continue broad spectrum antibiotics, Linezolid, Cefepime.  Chest pain Possible pericarditis, CT chest showed pericardial effusion. Troponin normal. EKG sinus, no new changes. Echo EF 55-60%, no valve abnl. Cardiology follow up appreciated. Continue Colchicine. She has allergy to Naproxen, states she tolerates Motrin, order placed.  Insulin dependent type 2 diabetes mellitus (HCC) Home long-acting insulin 40 units nightly resumed Insulin SSI with agents coverage ordered Hold oral hypoglycemics.  Prolonged Q-T interval on ECG Mag low replaced with IV supplements.  Hold QT prolonging drugs.  Bipolar disorder,  in full remission, most recent episode mixed (HCC) Venlafaxine 70 mg +37.5 mg daily with breakfast resumed, mirtazapine 15 mg nightly resumed Lurasidone 20 mg with supper resumed  Insomnia Continue Melatonin 5 mg nightly as needed for sleep.  Morbid obesity (HCC) Patient has BMI 40.35. This complicates overall care and prognosis.  Discussed diet, exercise and weight reduction.  Hyperlipidemia Continue Ezetimibe 10 mg daily.  Essential hypertension BP stable. Continue Lisinopril 20 mg daily.  Fibromyalgia Continue oxycodone 10 mg p.o. twice daily resumed. Dilaudid for severe pain. Ubrogepant changed to sumatriptan PRN.  DVT prophylaxis- Lovenox.  Subjective: Patient is seen and examined today morning. She has persistent chest discomfort. Afebrile. Advised out of bed to chair, incentive spirometry.  Physical Exam: Vitals:   03/08/23 2014 03/09/23 0022 03/09/23 0510 03/09/23 0742  BP: 130/84 137/85 (!) 153/84 125/84  Pulse: 85 87 93 86  Resp: 20 20 20 18   Temp: 98.3 F (36.8 C) 99 F (37.2 C) 98.7 F (37.1 C) 99 F (37.2 C)  TempSrc:      SpO2: 95% 93% 97% 92%  Weight:      Height:       General - Middle aged morbidly obese Caucasian female, no acute distress. Lungs -distant breath sounds, basal rales. Heart- S1, S2 heard, no murmurs. Abdomen - soft obese, bowel sounds good. Neuro - Alert, awake and oriented, non focal.  Data Reviewed:  CBC, BMP, cultures  Family Communication: Patient understands and agrees with above plan.  Disposition: Status is: Inpatient Remains inpatient appropriate because: ongoing chest pain, work up of sepsis  Planned Discharge Destination: Home    Time spent: 43 minutes  Author: Marcelino Duster, MD 03/09/2023 1:45 PM  For on call review [email or web address].

## 2023-03-09 NOTE — Progress Notes (Signed)
Rounding Note    Patient Name: Alison [name] Date of Encounter: 03/09/2023  [facility] Cardiologist: Debbe Odea, MD   Subjective   Patient reports mildly improved chest pain.   Inpatient Medications    Scheduled Meds:  baclofen  10 mg Oral BID   Chlorhexidine Gluconate Cloth  6 each Topical Daily   colchicine  0.6 mg Oral BID   enoxaparin (LOVENOX) injection  55 mg Subcutaneous Q24H   ezetimibe  10 mg Oral Daily   icosapent Ethyl  2 g Oral BID   insulin aspart  0-15 Units Subcutaneous TID WC   insulin aspart  0-5 Units Subcutaneous QHS   insulin aspart  3 Units Subcutaneous TID WC   insulin glargine-yfgn  40 Units Subcutaneous QHS   lisinopril  20 mg Oral Daily   lurasidone  20 mg Oral Q supper   melatonin  10 mg Oral QHS   mirabegron ER  50 mg Oral Daily   mirtazapine  15 mg Oral QHS   oxyCODONE  10 mg Oral Q12H   pantoprazole  40 mg Oral Daily   pregabalin  200 mg Oral BID   rosuvastatin  10 mg Oral Daily   sodium chloride flush  10-40 mL Intracatheter Q12H   tacrolimus  1 mg Oral q morning   venlafaxine XR  37.5 mg Oral Daily   venlafaxine XR  75 mg Oral Daily   Continuous Infusions:  ceFEPime (MAXIPIME) IV 2 g (03/09/23 0553)   linezolid (ZYVOX) IV Stopped (03/08/23 2120)   metronidazole 500 mg (03/09/23 0200)   PRN Meds: acetaminophen **OR** acetaminophen, benztropine, HYDROmorphone (DILAUDID) injection, melatonin, ondansetron **OR** ondansetron (ZOFRAN) IV, senna-docusate, sodium chloride flush, SUMAtriptan   Vital Signs    Vitals:   03/08/23 1553 03/08/23 2014 03/09/23 0022 03/09/23 0510  BP: 94/65 130/84 137/85 (!) 153/84  Pulse: 86 85 87 93  Resp: 18 20 20 20   Temp: 98.9 F (37.2 C) 98.3 F (36.8 C) 99 F (37.2 C) 98.7 F (37.1 C)  TempSrc:      SpO2: 94% 95% 93% 97%  Weight:      Height:        Intake/Output Summary (Last 24 hours) at 03/09/2023 0702 Last data filed at 03/09/2023 0630 Gross per 24 hour  Intake 1520.81  ml  Output 400 ml  Net 1120.81 ml      03/06/2023    7:35 AM 01/22/2023   10:29 AM 12/12/2022    4:22 PM  Last 3 Weights  Weight (lbs) 250 lb 252 lb 3.2 oz   Weight (kg) 113.399 kg 114.397 kg      Information is confidential and restricted. Go to Review Flowsheets to unlock data.      Telemetry     SR- Personally Reviewed  ECG    No new - Personally Reviewed  Physical Exam   GEN: No acute distress.   Neck: No JVD Cardiac: RRR, no murmurs, rubs, or gallops.  Respiratory: Clear to auscultation bilaterally. GI: Soft, nontender, non-distended  MS: No edema; No deformity. Neuro:  Nonfocal  Psych: Normal affect   Labs    High Sensitivity Troponin:   Recent Labs  Lab 03/06/23 0918 03/06/23 1337  TROPONINIHS 6 6     Chemistry Recent Labs  Lab 03/06/23 0918 03/06/23 1958 03/07/23 0500 03/09/23 0550  NA 136  --  135 136  K 4.9  --  4.3 4.1  CL 103  --  105 104  CO2 22  --  23 23  GLUCOSE 178*  --  187* 180*  BUN 19  --  19 16  CREATININE 1.05*  --  1.07* 0.98  CALCIUM 9.4  --  8.5* 8.5*  MG  --  1.2*  --  1.3*  PROT 7.8  --   --   --   ALBUMIN 4.3  --   --   --   AST 34  --   --   --   ALT 25  --   --   --   ALKPHOS 67  --   --   --   BILITOT 1.2  --   --   --   GFRNONAA >60  --  >60 >60  ANIONGAP 11  --  7 9    Lipids  Recent Labs  Lab 03/08/23 0700  CHOL 66  TRIG 87  HDL 30*  LDLCALC 19  CHOLHDL 2.2    Hematology Recent Labs  Lab 03/06/23 0918 03/07/23 0500 03/09/23 0550  WBC 11.5* 9.7 8.3  RBC 4.94 3.98 3.98  HGB 14.4 11.7* 11.6*  HCT 43.0 35.0* 34.7*  MCV 87.0 87.9 87.2  MCH 29.1 29.4 29.1  MCHC 33.5 33.4 33.4  RDW 14.4 14.6 14.4  PLT 148* 122* 141*   Thyroid No results for input(s): "TSH", "FREET4" in the last 168 hours.  BNPNo results for input(s): "BNP", "PROBNP" in the last 168 hours.  DDimer No results for input(s): "DDIMER" in the last 168 hours.   Radiology    ECHOCARDIOGRAM COMPLETE  Result Date: 03/07/2023     ECHOCARDIOGRAM REPORT   Patient Name:   Alison [name] Date of Exam: 03/07/2023 Medical Rec #:  [id number]         Height:       66.0 in Accession #:    [id number]        Weight:       250.0 lb Date of Birth:  1975-10-08         BSA:          2.199 m Patient Age:    47 years          BP:           145/84 mmHg Patient Gender: F                 HR:           99 bpm. Exam Location:  [facility] Procedure: 2D Echo, Cardiac Doppler and Color Doppler Indications:     Chest Pain  History:         Patient has prior history of Echocardiogram examinations, most                  recent 10/17/2021. Signs/Symptoms:Chest Pain and Murmur; Risk                  Factors:Hypertension, Sleep Apnea, Diabetes and Dyslipidemia.                  CKD, s/p Liver transplant.  Sonographer:     Mikki Harbor Referring Phys:  [id number] AMY N COX Diagnosing Phys: Lorine Bears MD  Sonographer Comments: Patient is obese. IMPRESSIONS  1. Left ventricular ejection fraction, by estimation, is 55 to 60%. The left ventricle has normal function. The left ventricle has no regional wall motion abnormalities. Left ventricular diastolic parameters were normal.  2. Right ventricular systolic function is normal. The right ventricular size is normal. There is normal pulmonary artery  systolic pressure.  3. Left atrial size was mildly dilated.  4. The mitral valve is normal in structure. No evidence of mitral valve regurgitation. No evidence of mitral stenosis.  5. The aortic valve is normal in structure. Aortic valve regurgitation is not visualized. No aortic stenosis is present. FINDINGS  Left Ventricle: Left ventricular ejection fraction, by estimation, is 55 to 60%. The left ventricle has normal function. The left ventricle has no regional wall motion abnormalities. The left ventricular internal cavity size was normal in size. There is  borderline left ventricular hypertrophy. Left ventricular diastolic parameters were normal. Right Ventricle: The right  ventricular size is normal. No increase in right ventricular wall thickness. Right ventricular systolic function is normal. There is normal pulmonary artery systolic pressure. The tricuspid regurgitant velocity is 2.57 m/s, and  with an assumed right atrial pressure of 5 mmHg, the estimated right ventricular systolic pressure is 31.4 mmHg. Left Atrium: Left atrial size was mildly dilated. Right Atrium: Right atrial size was normal in size. Pericardium: There is no evidence of pericardial effusion. Mitral Valve: The mitral valve is normal in structure. No evidence of mitral valve regurgitation. No evidence of mitral valve stenosis. MV peak gradient, 4.7 mmHg. The mean mitral valve gradient is 2.0 mmHg. Tricuspid Valve: The tricuspid valve is normal in structure. Tricuspid valve regurgitation is mild . No evidence of tricuspid stenosis. Aortic Valve: The aortic valve is normal in structure. Aortic valve regurgitation is not visualized. No aortic stenosis is present. Aortic valve mean gradient measures 7.0 mmHg. Aortic valve peak gradient measures 12.0 mmHg. Aortic valve area, by VTI measures 2.04 cm. Pulmonic Valve: The pulmonic valve was normal in structure. Pulmonic valve regurgitation is not visualized. No evidence of pulmonic stenosis. Aorta: The aortic root is normal in size and structure. Venous: The inferior vena cava was not well visualized. IAS/Shunts: No atrial level shunt detected by color flow Doppler.  LEFT VENTRICLE PLAX 2D LVIDd:         3.90 cm   Diastology LVIDs:         2.50 cm   LV e' medial:    8.70 cm/s LV PW:         1.20 cm   LV E/e' medial:  10.1 LV IVS:        1.10 cm   LV e' lateral:   11.60 cm/s LVOT diam:     2.00 cm   LV E/e' lateral: 7.5 LV SV:         69 LV SV Index:   31 LVOT Area:     3.14 cm  RIGHT VENTRICLE RV Basal diam:  3.35 cm RV Mid diam:    3.80 cm RV S prime:     10.70 cm/s TAPSE (M-mode): 2.4 cm LEFT ATRIUM             Index        RIGHT ATRIUM           Index LA diam:         3.90 cm 1.77 cm/m   RA Area:     18.80 cm LA Vol (A2C):   45.2 ml 20.56 ml/m  RA Volume:   50.30 ml  22.88 ml/m LA Vol (A4C):   38.7 ml 17.60 ml/m LA Biplane Vol: 41.0 ml 18.65 ml/m  AORTIC VALVE                     PULMONIC VALVE AV Area (Vmax):  2.23 cm      PV Vmax:       1.23 m/s AV Area (Vmean):   2.08 cm      PV Peak grad:  6.1 mmHg AV Area (VTI):     2.04 cm AV Vmax:           173.00 cm/s AV Vmean:          128.000 cm/s AV VTI:            0.339 m AV Peak Grad:      12.0 mmHg AV Mean Grad:      7.0 mmHg LVOT Vmax:         123.00 cm/s LVOT Vmean:        84.800 cm/s LVOT VTI:          0.220 m LVOT/AV VTI ratio: 0.65  AORTA Ao Root diam: 2.70 cm MITRAL VALVE               TRICUSPID VALVE MV Area (PHT): 4.08 cm    TR Peak grad:   26.4 mmHg MV Area VTI:   3.47 cm    TR Vmax:        257.00 cm/s MV Peak grad:  4.7 mmHg MV Mean grad:  2.0 mmHg    SHUNTS MV Vmax:       1.08 m/s    Systemic VTI:  0.22 m MV Vmean:      61.7 cm/s   Systemic Diam: 2.00 cm MV Decel Time: 186 msec MV E velocity: 87.50 cm/s MV A velocity: 55.90 cm/s MV E/A ratio:  1.57 Lorine Bears MD Electronically signed by Lorine Bears MD Signature Date/Time: 03/07/2023/3:17:23 PM    Final     Cardiac Studies   Echo 02/2023 1. Left ventricular ejection fraction, by estimation, is 55 to 60%. The  left ventricle has normal function. The left ventricle has no regional  wall motion abnormalities. Left ventricular diastolic parameters were  normal.   2. Right ventricular systolic function is normal. The right ventricular  size is normal. There is normal pulmonary artery systolic pressure.   3. Left atrial size was mildly dilated.   4. The mitral valve is normal in structure. No evidence of mitral valve  regurgitation. No evidence of mitral stenosis.   5. The aortic valve is normal in structure. Aortic valve regurgitation is  not visualized. No aortic stenosis is present.   Echo 10/2021 1. Left ventricular ejection fraction, by  estimation, is 50 to 55%. The  left ventricle has low normal function. The left ventricle has no regional  wall motion abnormalities. Left ventricular diastolic parameters were  normal. The average left ventricular   global longitudinal strain is -17.6 %. The global longitudinal strain is  normal.   2. Right ventricular systolic function is normal. The right ventricular  size is normal.   3. The mitral valve is normal in structure. No evidence of mitral valve  regurgitation.   4. The aortic valve was not well visualized. Aortic valve regurgitation  is not visualized.   5. Aortic dilatation noted. There is mild dilatation of the aortic root  and of the ascending aorta, measuring 39 mm.    Myoview Lexiscan 09/2021 Narrative & Impression  Pharmacological myocardial perfusion imaging study with no significant  ischemia Normal wall motion, EF estimated at 66% No EKG changes concerning for ischemia at peak stress or in recovery. CT attenuation correction images with no significant coronary calcification or aortic atherosclerosis Low risk  scan     Signed, Dossie Arbour, MD, Ph.D [facility]    Patient Profile     47 y.o. 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 She is on immunosuppressants as she is s/p liver transplant - HS trop 6>6 - Chest CT showed small pericardial effusion measuring 6mm with inflammation in the pericardial fat - Echo showed normal LVEF - sed rate and CRP  elevated - she is intolerant to Naproxen - started on colchicine 0.6mg BID x 3 months  HTN - lisinopril 20mg  daily   HLD - LDL 19  - continue Crestor, Zetia 10mg  daily and Vascepa  For questions or updates, please contact [facility] Please consult [email or web address] for contact info under        Signed, Lolita Faulds David Stall, PA-C  03/09/2023, 7:02 AM

## 2023-03-10 DIAGNOSIS — R9431 Abnormal electrocardiogram [ECG] [EKG]: Secondary | ICD-10-CM | POA: Diagnosis not present

## 2023-03-10 DIAGNOSIS — Z79899 Other long term (current) drug therapy: Secondary | ICD-10-CM | POA: Diagnosis not present

## 2023-03-10 DIAGNOSIS — I309 Acute pericarditis, unspecified: Secondary | ICD-10-CM

## 2023-03-10 DIAGNOSIS — M797 Fibromyalgia: Secondary | ICD-10-CM | POA: Diagnosis not present

## 2023-03-10 DIAGNOSIS — R072 Precordial pain: Secondary | ICD-10-CM | POA: Diagnosis not present

## 2023-03-10 DIAGNOSIS — F3178 Bipolar disorder, in full remission, most recent episode mixed: Secondary | ICD-10-CM | POA: Diagnosis not present

## 2023-03-10 DIAGNOSIS — Z794 Long term (current) use of insulin: Secondary | ICD-10-CM | POA: Diagnosis not present

## 2023-03-10 DIAGNOSIS — I300 Acute nonspecific idiopathic pericarditis: Secondary | ICD-10-CM | POA: Diagnosis not present

## 2023-03-10 DIAGNOSIS — E782 Mixed hyperlipidemia: Secondary | ICD-10-CM | POA: Diagnosis not present

## 2023-03-10 DIAGNOSIS — E119 Type 2 diabetes mellitus without complications: Secondary | ICD-10-CM | POA: Diagnosis not present

## 2023-03-10 DIAGNOSIS — A419 Sepsis, unspecified organism: Secondary | ICD-10-CM | POA: Diagnosis not present

## 2023-03-10 DIAGNOSIS — I10 Essential (primary) hypertension: Secondary | ICD-10-CM | POA: Diagnosis not present

## 2023-03-10 LAB — MAGNESIUM: Magnesium: 2 mg/dL (ref 1.7–2.4)

## 2023-03-10 LAB — GLUCOSE, CAPILLARY
Glucose-Capillary: 161 mg/dL — ABNORMAL HIGH (ref 70–99)
Glucose-Capillary: 265 mg/dL — ABNORMAL HIGH (ref 70–99)
Glucose-Capillary: 189 mg/dL — ABNORMAL HIGH (ref 70–99)
Glucose-Capillary: 150 mg/dL — ABNORMAL HIGH (ref 70–99)

## 2023-03-10 LAB — CREATININE, SERUM
Creatinine, Ser: 0.98 mg/dL (ref 0.44–1.00)
GFR, Estimated: >60 mL/min (ref >60)

## 2023-03-10 LAB — CULTURE, BLOOD (ROUTINE X 2): Culture: NO GROWTH

## 2023-03-10 MED ORDER — POLYETHYLENE GLYCOL 3350 17 G PO PACK
17.0000 g | PACK | Freq: Every day | ORAL | Status: DC | PRN
  Administered 2023-03-10: 17 g via ORAL
  Filled 2023-03-10 (×2): qty 1

## 2023-03-10 MED ORDER — LISINOPRIL 10 MG PO TABS
10.0000 mg | ORAL_TABLET | Freq: Every day | ORAL | Status: DC
  Administered 2023-03-11: 10 mg via ORAL
  Filled 2023-03-10: qty 1

## 2023-03-10 NOTE — Progress Notes (Signed)
Progress Note   [name] GNF:[id number] DOB: Oct 11, 1975 DOA: 03/06/2023     4 DOS: the patient was seen and examined on 03/10/2023   Brief hospital course: Ms. 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 She was febrile started on broad spectrum empiric antibiotics. CT chest no PE, has small pleural effusion. Cardiology consulted for pericarditis, advised colchicine therapy. Due to persistent chest pain, started motrin. States she is able to tolerate motrin not Naproxen. Fever improved.   Assessment and Plan: * Sepsis (HCC) On presentation she had fever, tachycardia, tachypnea, leukocytosis, lactic acid high. Respiratory panel negative. Urine, blood cultures x 2 negative so far.  Continue to monitor vitals closely. Will stop all antibiotics, Linezolid, Cefepime, flagyl.  Chest pain Possible pericarditis, CT chest showed pericardial effusion. Troponin normal. EKG sinus, no new changes. Echo EF 55-60%, no valve abnl. Cardiology follow up appreciated. Continue Colchicine, motrin. She has allergy to Naproxen, states she tolerates Motrin. Outpatient cardiology follow up suggested.  Insulin dependent type 2 diabetes mellitus (HCC) Home long-acting insulin 40 units nightly resumed Insulin SSI with agents coverage ordered Hold oral hypoglycemics.  Prolonged Q-T interval on ECG Mag  improved post IV supplements.  Hold QT prolonging drugs.  Bipolar disorder, in full remission, most recent episode mixed (HCC) Venlafaxine 70 mg +37.5 mg daily with breakfast resumed, mirtazapine 15 mg nightly resumed Lurasidone 20 mg with supper resumed  Insomnia Continue Melatonin 5 mg nightly as needed for sleep.  Morbid obesity (HCC) Patient has BMI 40.35. This complicates overall care and prognosis.  Discussed diet, exercise and weight reduction.  Hyperlipidemia Continue Ezetimibe 10 mg daily.  Essential hypertension BP stable. Continue Lisinopril 20 mg daily.  Fibromyalgia Continue oxycodone 10 mg p.o. twice daily resumed. Dilaudid for severe pain. Ubrogepant changed to sumatriptan PRN. Outpatient pain clinic follow up.  DVT prophylaxis- Lovenox.  Subjective: Patient is seen and examined today morning. Chest discomfort improved. Afebrile. Feels weak, does not feel ready to go home. Advised out of bed to chair, incentive spirometry.  Physical Exam: Vitals:   03/09/23 1605 03/09/23 2106 03/10/23 0429 03/10/23 0804  BP: 97/66 (!) 88/62 (!) 84/61 94/72  Pulse: 81 74 73 64  Resp: 18 20 18 17   Temp: 98.6 F (37 C) 98.2 F (36.8 C) 97.7 F (36.5 C) (!) 97.5 F (36.4 C)  TempSrc:      SpO2: 90% 93% 98% 98%  Weight:      Height:       General - Middle aged morbidly obese Caucasian female, no acute distress. Lungs -distant breath sounds, basal rales. Heart- S1, S2 heard, no murmurs. Abdomen - soft obese, bowel sounds good. Neuro - Alert, awake and oriented, non focal.  Data Reviewed:  magnesium, cultures  Family Communication: Patient understands and agrees with above plan.  Disposition: Status is: Inpatient Remains inpatient appropriate because: ongoing chest pain, pending cultures  Planned Discharge Destination: Home tomorrow.     Code Status: Full Code  Time spent: 43 minutes  Author:  Marcelino Duster, MD 03/10/2023 12:59 PM  For on call review  [email or web address].

## 2023-03-10 NOTE — Progress Notes (Addendum)
Cardiology Progress Note   Patient Name: Alison [name] Date of Encounter: 03/10/2023  Primary Cardiologist: Debbe Odea, MD  Subjective   Chest pain much improved.  Now 3/10 and only with deep breathing.  Notes some difficulty taking a deep breath because of the pain.  Inpatient Medications    Scheduled Meds:  baclofen  10 mg Oral BID   Chlorhexidine Gluconate Cloth  6 each Topical Daily   colchicine  0.6 mg Oral BID   enoxaparin (LOVENOX) injection  55 mg Subcutaneous Q24H   ezetimibe  10 mg Oral Daily   ibuprofen  400 mg Oral Q6H   icosapent Ethyl  2 g Oral BID   insulin aspart  0-15 Units Subcutaneous TID WC   insulin aspart  0-5 Units Subcutaneous QHS   insulin aspart  3 Units Subcutaneous TID WC   insulin glargine-yfgn  40 Units Subcutaneous QHS   lisinopril  20 mg Oral Daily   lurasidone  20 mg Oral Q supper   melatonin  10 mg Oral QHS   mirabegron ER  50 mg Oral Daily   mirtazapine  15 mg Oral QHS   oxyCODONE  10 mg Oral Q12H   pantoprazole  40 mg Oral Daily   pregabalin  200 mg Oral BID   rosuvastatin  10 mg Oral Daily   sodium chloride flush  10-40 mL Intracatheter Q12H   tacrolimus  1 mg Oral q morning   venlafaxine XR  37.5 mg Oral Daily   venlafaxine XR  75 mg Oral Daily   Continuous Infusions:  ceFEPime (MAXIPIME) IV 2 g (03/10/23 1237)   linezolid (ZYVOX) IV 600 mg (03/10/23 0922)   metronidazole 500 mg (03/10/23 1122)   PRN Meds: acetaminophen **OR** acetaminophen, benztropine, HYDROmorphone (DILAUDID) injection, melatonin, ondansetron **OR** ondansetron (ZOFRAN) IV, senna-docusate, sodium chloride flush, SUMAtriptan   Vital Signs    Vitals:   03/09/23 1605 03/09/23 2106 03/10/23 0429 03/10/23 0804  BP: 97/66 (!) 88/62 (!) 84/61 94/72  Pulse: 81 74 73 64  Resp: 18 20 18 17   Temp: 98.6 F (37 C) 98.2 F (36.8 C) 97.7 F (36.5 C) (!) 97.5 F (36.4 C)  TempSrc:      SpO2: 90% 93% 98% 98%  Weight:      Height:         Intake/Output Summary (Last 24 hours) at 03/10/2023 1240 Last data filed at 03/10/2023 1000 Gross per 24 hour  Intake 1738.18 ml  Output --  Net 1738.18 ml   Filed Weights   03/06/23 0735  Weight: 113.4 kg    Physical Exam   GEN: Well nourished, well developed, in no acute distress.  HEENT: Grossly normal.  Neck: Supple, no JVD, carotid bruits, or masses. Cardiac: RRR, no murmurs, rubs, or gallops. No clubbing, cyanosis, edema.  Radials 2+, DP/PT 2+ and equal bilaterally.  Respiratory:  Respirations regular and unlabored, clear to auscultation bilaterally. GI: Soft, nontender, nondistended, BS + x 4. MS: no deformity or atrophy. Skin: warm and dry, no rash. Neuro:  Strength and sensation are intact. Psych: AAOx3.  Normal affect.  Labs    Chemistry Recent Labs  Lab 03/06/23 0918 03/07/23 0500 03/09/23 0550 03/10/23 0619  NA 136 135 136  --   K 4.9 4.3 4.1  --   CL 103 105 104  --   CO2 22 23 23   --   GLUCOSE 178* 187* 180*  --   BUN 19 19 16   --   CREATININE  1.05* 1.07* 0.98 0.98  CALCIUM 9.4 8.5* 8.5*  --   PROT 7.8  --   --   --   ALBUMIN 4.3  --   --   --   AST 34  --   --   --   ALT 25  --   --   --   ALKPHOS 67  --   --   --   BILITOT 1.2  --   --   --   GFRNONAA >60 >60 >60 >60  ANIONGAP 11 7 9   --      Hematology Recent Labs  Lab 03/06/23 0918 03/07/23 0500 03/09/23 0550  WBC 11.5* 9.7 8.3  RBC 4.94 3.98 3.98  HGB 14.4 11.7* 11.6*  HCT 43.0 35.0* 34.7*  MCV 87.0 87.9 87.2  MCH 29.1 29.4 29.1  MCHC 33.5 33.4 33.4  RDW 14.4 14.6 14.4  PLT 148* 122* 141*    Cardiac Enzymes  Recent Labs  Lab 03/06/23 0918 03/06/23 1337  TROPONINIHS 6 6     Lipids  Lab Results  Component Value Date   CHOL 66 03/08/2023   HDL 30 (L) 03/08/2023   LDLCALC 19 03/08/2023   TRIG 87 03/08/2023   CHOLHDL 2.2 03/08/2023    HbA1c  Lab Results  Component Value Date   HGBA1C 8.8 (H) 03/07/2023    Radiology    CT CHEST ABDOMEN PELVIS W  CONTRAST  Result Date: 03/06/2023 CLINICAL DATA:  Sepsis EXAM: CT CHEST, ABDOMEN, AND PELVIS WITH CONTRAST TECHNIQUE: Multidetector CT imaging of the chest, abdomen and pelvis was performed following the standard protocol during bolus administration of intravenous contrast. RADIATION DOSE REDUCTION: This exam was performed according to the departmental dose-optimization program which includes automated exposure control, adjustment of the mA and/or kV according to patient size and/or use of iterative reconstruction technique. CONTRAST:  OMNIPAQUE IOHEXOL 300 MG/ML  SOLN COMPARISON:  None Available. FINDINGS: CT CHEST FINDINGS Cardiovascular: Small pericardial effusion measuring 6 mm in depth. There is inflammatory stranding within the pericardial fat (image 38/2 ) Mediastinum/Nodes: No axillary or supraclavicular adenopathy. No mediastinal or hilar adenopathy. No pericardial fluid. Esophagus normal. Lungs/Pleura: No airspace disease. No pneumothorax. No pleural fluid. Musculoskeletal: No aggressive osseous lesion. CT ABDOMEN AND PELVIS FINDINGS Hepatobiliary: No focal hepatic lesion. Postcholecystectomy. Common bile duct mildly dilated 12 mm following cholecystectomy no biliary dilatation. Pancreas: Pancreas is normal. No ductal dilatation. No pancreatic inflammation. Spleen: Normal spleen Adrenals/urinary tract: Adrenal glands normal. Bilateral renal cortical scarring. 15 mm simple fluid attenuation LEFT renal cyst on postcontrast imaging is present. Small nonenhancing cyst of the RIGHT kidney. No enhancing renal cortical lesion. The ureters and bladder normal. Stomach/Bowel: The stomach, duodenum, and small bowel normal. The colon and rectosigmoid colon are normal. Vascular/Lymphatic: Abdominal aorta is normal caliber. There is no retroperitoneal or periportal lymphadenopathy. No pelvic lymphadenopathy. Reproductive: Uterus and adnexa unremarkable. Other: No free fluid. Musculoskeletal: No aggressive  osseous lesion. IMPRESSION: CHEST IMPRESSION: 1. Small pericardial effusion and inflammation in the pericardial fat. Recommend correlation for pericarditis. 2. No acute pulmonary findings. PELVIS IMPRESSION: 1. No acute findings in the abdomen pelvis. 2. Bilateral renal cortical scarring and benign Bosniak 1 renal cysts. No follow-up recommended for benign renal lesion. Electronically Signed   By: Genevive Bi M.D.   On: 03/06/2023 15:36    Telemetry    Non-tele   Cardiac Studies   2D Echocardiogram 6.1.2024   1. Left ventricular ejection fraction, by estimation, is 55 to 60%. The  left ventricle has normal function. The left ventricle has no regional  wall motion abnormalities. Left ventricular diastolic parameters were  normal.   2. Right ventricular systolic function is normal. The right ventricular  size is normal. There is normal pulmonary artery systolic pressure.   3. Left atrial size was mildly dilated.   4. The mitral valve is normal in structure. No evidence of mitral valve  regurgitation. No evidence of mitral stenosis.   5. The aortic valve is normal in structure. Aortic valve regurgitation is  not visualized. No aortic stenosis is present.   Patient Profile     47 y.o. 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  Acute pericarditis: Patient presented May 30, with pleuritic chest pain, normal troponins, normal LV function without regional wall motion abnormalities on echo.  Small effusion noted.  Pain much improved following addition of  ibuprofen on June 2.  Recommend continuation ibuprofen over the next 2 weeks with colchicine x 3 months.  Continue PPI therapy.  We will arrange for outpatient follow-up in approximately 2 weeks (6/19 @ 3:10).  2.  Essential hypertension: Blood pressure trending soft.  She is on moderate dose lisinopril therapy, which I will reduce to 10 mg daily given documented pressures in the 80s and 90s.  3.  Hyperlipidemia: Very well-controlled on rosuvastatin, Zetia, and Vascepa.  Total cholesterol 66 with an LDL of 19.  Suspect Zetia could be discontinued.  Signed, Nicolasa Ducking, NP  03/10/2023, 12:40 PM    For questions or updates, please contact   Please consult [email or web address] for contact info under Cardiology/STEMI.

## 2023-03-10 NOTE — Inpatient Diabetes Management (Signed)
Inpatient Diabetes Program Recommendations  AACE/ADA: New Consensus Statement on Inpatient Glycemic Control   Target Ranges:  Prepandial:   less than 140 mg/dL      Peak postprandial:   less than 180 mg/dL (1-2 hours)      Critically ill patients:  140 - 180 mg/dL    Latest Reference Range & Units 03/10/23 08:06 03/10/23 11:37  Glucose-Capillary 70 - 99 mg/dL 161 (H) 096 (H)    Latest Reference Range & Units 03/09/23 07:08 03/09/23 11:47 03/09/23 16:55 03/09/23 21:11  Glucose-Capillary 70 - 99 mg/dL 045 (H) 409 (H) 811 (H) 168 (H)   Review of Glycemic Control  Diabetes history: DM2 Outpatient Diabetes medications: Basaglar 40 units QHS, Trulicity 4.5 mg Qweek, Metformin 1000 mg BID, Novolog 0-30 units TID Current orders for Inpatient glycemic control: Semglee 40 units QHS, Novolog 3 units TID with meals, Novolog 0-15 units TID with meals, Novolog 0-5 units QHS  Inpatient Diabetes Program Recommendations:    Insulin: Please consider increasing meal coverage to Novolog 5 units TID with meals.  Thanks, Orlando Penner, RN, MSN, CDCES Diabetes Coordinator Inpatient Diabetes Program [phone] (Team Pager from 8am to 5pm)

## 2023-03-11 DIAGNOSIS — E119 Type 2 diabetes mellitus without complications: Secondary | ICD-10-CM | POA: Diagnosis not present

## 2023-03-11 DIAGNOSIS — F3178 Bipolar disorder, in full remission, most recent episode mixed: Secondary | ICD-10-CM | POA: Diagnosis not present

## 2023-03-11 DIAGNOSIS — Z79899 Other long term (current) drug therapy: Secondary | ICD-10-CM | POA: Diagnosis not present

## 2023-03-11 DIAGNOSIS — E782 Mixed hyperlipidemia: Secondary | ICD-10-CM | POA: Diagnosis not present

## 2023-03-11 DIAGNOSIS — I300 Acute nonspecific idiopathic pericarditis: Secondary | ICD-10-CM | POA: Diagnosis not present

## 2023-03-11 DIAGNOSIS — R9431 Abnormal electrocardiogram [ECG] [EKG]: Secondary | ICD-10-CM | POA: Diagnosis not present

## 2023-03-11 DIAGNOSIS — I10 Essential (primary) hypertension: Secondary | ICD-10-CM | POA: Diagnosis not present

## 2023-03-11 DIAGNOSIS — Z794 Long term (current) use of insulin: Secondary | ICD-10-CM | POA: Diagnosis not present

## 2023-03-11 DIAGNOSIS — M797 Fibromyalgia: Secondary | ICD-10-CM | POA: Diagnosis not present

## 2023-03-11 DIAGNOSIS — A419 Sepsis, unspecified organism: Secondary | ICD-10-CM | POA: Diagnosis not present

## 2023-03-11 DIAGNOSIS — R072 Precordial pain: Secondary | ICD-10-CM | POA: Diagnosis not present

## 2023-03-11 LAB — GLUCOSE, CAPILLARY
Glucose-Capillary: 134 mg/dL — ABNORMAL HIGH (ref 70–99)
Glucose-Capillary: 159 mg/dL — ABNORMAL HIGH (ref 70–99)

## 2023-03-11 LAB — CULTURE, BLOOD (ROUTINE X 2)
Special Requests: ADEQUATE
Special Requests: ADEQUATE
Special Requests: ADEQUATE

## 2023-03-11 MED ORDER — IBUPROFEN 400 MG PO TABS: 400.0000 mg | ORAL_TABLET | Freq: Four times a day (QID) | ORAL | 0 refills | Status: DC | PRN

## 2023-03-11 MED ORDER — COLCHICINE 0.6 MG PO TABS: 0.6000 mg | ORAL_TABLET | Freq: Two times a day (BID) | ORAL | 1 refills | Status: DC

## 2023-03-11 MED ORDER — HEPARIN SOD (PORK) LOCK FLUSH 100 UNIT/ML IV SOLN
500.0000 [IU] | Freq: Once | INTRAVENOUS | Status: DC
  Filled 2023-03-11: qty 5

## 2023-03-11 NOTE — Inpatient Diabetes Management (Signed)
Inpatient Diabetes Program Recommendations  AACE/ADA: New Consensus Statement on Inpatient Glycemic Control  Target Ranges:  Prepandial:   less than 140 mg/dL      Peak postprandial:   less than 180 mg/dL (1-2 hours)      Critically ill patients:  140 - 180 mg/dL    Latest Reference Range & Units 03/10/23 08:06 03/10/23 11:37 03/10/23 15:49 03/10/23 20:48  Glucose-Capillary 70 - 99 mg/dL 409 (H) 811 (H) 914 (H) 150 (H)    Latest Reference Range & Units 03/09/23 07:08 03/09/23 11:47 03/09/23 16:55 03/09/23 21:11  Glucose-Capillary 70 - 99 mg/dL 782 (H) 956 (H) 213 (H) 168 (H)   Review of Glycemic Control  Diabetes history: DM2 Outpatient Diabetes medications: Basaglar 40 units QHS, Trulicity 4.5 mg Qweek, Metformin 1000 mg BID, Novolog 0-30 units TID Current orders for Inpatient glycemic control: Semglee 40 units QHS, Novolog 3 units TID with meals, Novolog 0-15 units TID with meals, Novolog 0-5 units QHS   Inpatient Diabetes Program Recommendations:     Insulin: Please consider increasing meal coverage to Novolog 5 units TID with meals.   Thanks, Orlando Penner, RN, MSN, CDCES Diabetes Coordinator Inpatient Diabetes Program [phone] (Team Pager from 8am to 5pm)

## 2023-03-11 NOTE — Discharge Summary (Signed)
Physician Discharge Summary   [name] MRN: [id number] DOB: 1976-05-29  Admit date:     03/06/2023  Discharge date: 03/11/23  Discharge Physician: Marcelino Duster   PCP: [name], FNP   Recommendations at discharge:    PCP follow up in 1 week Oncology, pain clinic follow up as scheduled.  Discharge Diagnoses: Principal Problem:   Sepsis (HCC) Active Problems:   Fibromyalgia   Essential hypertension   Hyperlipidemia   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HCC)   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HCC)   Major depressive disorder, recurrent (HCC)   Morbid obesity (HCC)   Cubital tunnel syndrome on left   Insomnia   Bipolar disorder, in full remission, most recent episode mixed (HCC)   Prolonged Q-T interval on ECG   High risk medication use   Insulin dependent type 2 diabetes mellitus (HCC)   Chest pain   Acute pericarditis   Acute idiopathic pericarditis  Resolved Problems:   * No resolved hospital problems. *  Hospital Course: 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 he was febrile started on broad spectrum empiric antibiotics. CT chest no PE, has small pleural effusion. Vancomycin stooped due to rash. Continued on Cefepime, flagyl, Linezolid. Cardiology consulted for pericarditis, advised colchicine therapy. Echo unremarkable. Respiratory panel  negative. Fever better. Has persistent chest pain, started motrin and continued on colchicine therapy. Her symptoms better, finished 5 days of antibiotics, blood, urine cultures negative. She is hemodynamically  Stable to be discharged. Advised follow up with PCP, Oncology and pain clinic upon discharge. Motrin, Colchicine scripts sent to pharmacy. She understands and agrees with discharge plan.  Assessment and Plan: * Sepsis (HCC) Leukocytosis with elevated respiration rate, lactic acid elevated to 2.3, white cell count 11.5 Due to her being on immunosuppressants treated with broad spectrum antibiotics Cefepime, Linezolid, Flagyl. Vancomycin stopped due to rash. Blood cultures x 2 and urine cultures negative. No source of infection identified. Finished 5 days of antibiotics. Remains afebrile.  Acute pericarditis- Mild pericardia effusion on CT. Echo unremarkable. Cardiology evaluated her for pericarditis - advised Colchicine, motrin therapy ( states she has allergy to Naproxen). New scripts for colchicine, motrin sent to pharmacy,  Insulin dependent type 2 diabetes mellitus (HCC) Insulin adjusted fr high blood sugars. Advised to keep a log of blood sugars for PCP to change her medications. Carb consistent diet.  Prolonged Q-T interval on ECG Hypomagnesemia Stable, electrolytes monitored and replaced accordingly  Bipolar disorder, in full remission, most recent episode mixed (HCC) Continue homemeds -Venlafaxine 70 mg +37.5 mg daily, mirtazapine 15 mg nightly, Lurasidone 20 mg with supper.  Morbid obesity (HCC) Patient has BMI 40.35. Diet, exercise,weight reduction advised.  Hyperlipidemia Ezetimibe 10 mg daily  Essential hypertension Lisinopril 20 mg daily  Fibromyalgia Advised to follow up with pain clinic.     Pain control - [employer] Controlled Substance Reporting System database was reviewed. and patient was instructed, not to drive, operate heavy machinery, perform  activities at heights, swimming  or participation in water activities or provide baby-sitting services while on Pain, Sleep and Anxiety Medications; until their outpatient Physician has advised to do so again. Also recommended to not to take more than prescribed Pain, Sleep and Anxiety Medications.  Consultants: Cardiology Procedures performed: none  Disposition: Home Diet recommendation:  Discharge Diet Orders (From admission, onward)     Start     Ordered   03/11/23 0000  Diet - low sodium heart healthy        03/11/23 1213           Cardiac and Carb modified diet DISCHARGE MEDICATION: Allergies as of 03/11/2023       Reactions   Penicillin G Itching, Rash, Anaphylaxis, Palpitations   Lamictal [lamotrigine] Rash   Carbamazepine Other (See Comments)   Medication interaction-prograf   Hydrocodone-acetaminophen Itching   Naproxen Itching   Penicillins    Vancomycin Rash   Macular/blotchy rash at infusion site        Medication List     TAKE these medications    baclofen 10 MG tablet Commonly known as: LIORESAL Take 1 tablet by mouth 2 (two) times daily.   Basaglar KwikPen 100 UNIT/ML Inject 40 Units into the skin at bedtime.   benztropine 1 MG tablet Commonly known as: COGENTIN Take 1 tablet (1 mg total) by mouth daily as needed for tremors.   colchicine 0.6 MG tablet Take 1 tablet (0.6 mg total) by mouth 2 (two) times daily.   dronabinol 2.5 MG capsule Commonly known as: MARINOL Take 2.5 mg by mouth See admin instructions. Takes every other day   Dulaglutide 4.5 MG/0.5ML Sopn Inject 4.5 mg into the skin once a week.   ezetimibe 10 MG tablet Commonly known as: ZETIA Take 10 mg by mouth every morning.   ibuprofen 400 MG tablet Commonly known as: ADVIL Take 1 tablet (400 mg total) by mouth every 6 (six) hours as needed.   icosapent Ethyl 1 g capsule Commonly known as: VASCEPA Take 2 capsules (2 g total) by mouth 2 (two) times daily.   lisinopril 20 MG  tablet Commonly known as: ZESTRIL TAKE ONE TABLET BY MOUTH ONCE DAILY   lurasidone 20 MG Tabs tablet Commonly known as: LATUDA Take 1 tablet (20 mg total) by mouth daily with supper.   magnesium oxide 400 MG tablet Commonly known as: MAG-OX Take by mouth.   Melatonin 10 MG Tabs Take 10 mg by mouth at bedtime.   meloxicam 15 MG tablet Commonly known as: MOBIC Take 1 tablet (15 mg total) by mouth daily.   metFORMIN 1000 MG tablet Commonly known as: GLUCOPHAGE Take 1 tablet (1,000 mg total) by mouth 2 (two) times daily with a meal.   methocarbamol 500 MG tablet Commonly known as: ROBAXIN Take 1 tablet (500 mg total) by mouth 4 (four) times daily.   mirabegron ER 50 MG Tb24 tablet Commonly known as: Myrbetriq Take 1 tablet (50 mg total) by mouth daily.   mirtazapine 15 MG tablet Commonly known as: REMERON TAKE ONE TABLET BY MOUTH EVERYDAY AT BEDTIME What changed: See the new instructions.   naloxone 4 MG/0.1ML Liqd nasal spray kit Commonly known as: NARCAN Place 0.4 mg into the nose once.   NovoLOG FlexPen 100 UNIT/ML FlexPen Generic drug: insulin aspart Inject 0-30 Units into the skin 3 (three) times daily with meals. Sliding scale   Precision QID Test test strip Generic drug: glucose blood   pregabalin 200 MG capsule Commonly known as: LYRICA Take 1  capsule (200 mg total) by mouth 2 (two) times daily.   rosuvastatin 10 MG tablet Commonly known as: Crestor Take 1 tablet (10 mg total) by mouth daily.   tacrolimus 1 MG capsule Commonly known as: PROGRAF Take 1 capsule by mouth every morning.   Tresiba 100 UNIT/ML Soln Generic drug: Insulin Degludec Inject 30 Units into the skin.   Ubrelvy 50 MG Tabs Generic drug: Ubrogepant Take 1 tablet by mouth daily as needed.   venlafaxine XR 37.5 MG 24 hr capsule Commonly known as: Effexor XR Take 1 capsule (37.5 mg total) by mouth daily with breakfast. Take along with 75 mg daily   venlafaxine XR 75 MG 24 hr  capsule Commonly known as: Effexor XR Take 1 capsule (75 mg total) by mouth daily with breakfast.   Vitamin D 125 MCG (5000 UT) Caps Take 5,000 Units by mouth daily.   Xtampza ER 9 MG C12a Generic drug: oxyCODONE ER Take 1 capsule by mouth every 12 (twelve) hours.        Discharge Exam: Filed Weights   03/06/23 0735  Weight: 113.4 kg  Blood pressure 112/73, pulse 67, temperature 98.9 F (37.2 C), resp. rate 18, height 5\' 6"  (1.676 m), weight 113.4 kg, SpO2 99 %.   General - Middle aged morbidly obese Caucasian female, no acute distress. Lungs -distant breath sounds, basal rales. Heart- S1, S2 heard, no murmurs. Abdomen - soft obese, bowel sounds good. Neuro - Alert, awake and oriented, non focal.  Condition at discharge: stable  The results of significant diagnostics from this hospitalization (including imaging, microbiology, ancillary and laboratory) are listed below for reference.   Imaging Studies: ECHOCARDIOGRAM COMPLETE  Result Date: 03/07/2023    ECHOCARDIOGRAM REPORT   Patient Name:   HENRETTA CLAREY Date of Exam: 03/07/2023 Medical Rec #:  [id number]         Height:       66.0 in Accession #:    [id number]        Weight:       250.0 lb Date of Birth:  June 26, 1976         BSA:          2.199 m Patient Age:    47 years          BP:           145/84 mmHg Patient Gender: F                 HR:           99 bpm. Exam Location:  [facility] Procedure: 2D Echo, Cardiac Doppler and Color Doppler Indications:     Chest Pain  History:         Patient has prior history of Echocardiogram examinations, most                  recent 10/17/2021. Signs/Symptoms:Chest Pain and Murmur; Risk                  Factors:Hypertension, Sleep Apnea, Diabetes and Dyslipidemia.                  CKD, s/p Liver transplant.  Sonographer:     Mikki Harbor Referring Phys:  [id number] AMY N COX Diagnosing Phys: Lorine Bears MD  Sonographer Comments: Patient is obese. IMPRESSIONS  1. Left ventricular ejection  fraction, by estimation, is 55 to 60%. The left ventricle has normal function. The left ventricle has no regional wall motion abnormalities. Left ventricular  diastolic parameters were normal.  2. Right ventricular systolic function is normal. The right ventricular size is normal. There is normal pulmonary artery systolic pressure.  3. Left atrial size was mildly dilated.  4. The mitral valve is normal in structure. No evidence of mitral valve regurgitation. No evidence of mitral stenosis.  5. The aortic valve is normal in structure. Aortic valve regurgitation is not visualized. No aortic stenosis is present. FINDINGS  Left Ventricle: Left ventricular ejection fraction, by estimation, is 55 to 60%. The left ventricle has normal function. The left ventricle has no regional wall motion abnormalities. The left ventricular internal cavity size was normal in size. There is  borderline left ventricular hypertrophy. Left ventricular diastolic parameters were normal. Right Ventricle: The right ventricular size is normal. No increase in right ventricular wall thickness. Right ventricular systolic function is normal. There is normal pulmonary artery systolic pressure. The tricuspid regurgitant velocity is 2.57 m/s, and  with an assumed right atrial pressure of 5 mmHg, the estimated right ventricular systolic pressure is 31.4 mmHg. Left Atrium: Left atrial size was mildly dilated. Right Atrium: Right atrial size was normal in size. Pericardium: There is no evidence of pericardial effusion. Mitral Valve: The mitral valve is normal in structure. No evidence of mitral valve regurgitation. No evidence of mitral valve stenosis. MV peak gradient, 4.7 mmHg. The mean mitral valve gradient is 2.0 mmHg. Tricuspid Valve: The tricuspid valve is normal in structure. Tricuspid valve regurgitation is mild . No evidence of tricuspid stenosis. Aortic Valve: The aortic valve is normal in structure. Aortic valve regurgitation is not visualized.  No aortic stenosis is present. Aortic valve mean gradient measures 7.0 mmHg. Aortic valve peak gradient measures 12.0 mmHg. Aortic valve area, by VTI measures 2.04 cm. Pulmonic Valve: The pulmonic valve was normal in structure. Pulmonic valve regurgitation is not visualized. No evidence of pulmonic stenosis. Aorta: The aortic root is normal in size and structure. Venous: The inferior vena cava was not well visualized. IAS/Shunts: No atrial level shunt detected by color flow Doppler.  LEFT VENTRICLE PLAX 2D LVIDd:         3.90 cm   Diastology LVIDs:         2.50 cm   LV e' medial:    8.70 cm/s LV PW:         1.20 cm   LV E/e' medial:  10.1 LV IVS:        1.10 cm   LV e' lateral:   11.60 cm/s LVOT diam:     2.00 cm   LV E/e' lateral: 7.5 LV SV:         69 LV SV Index:   31 LVOT Area:     3.14 cm  RIGHT VENTRICLE RV Basal diam:  3.35 cm RV Mid diam:    3.80 cm RV S prime:     10.70 cm/s TAPSE (M-mode): 2.4 cm LEFT ATRIUM             Index        RIGHT ATRIUM           Index LA diam:        3.90 cm 1.77 cm/m   RA Area:     18.80 cm LA Vol (A2C):   45.2 ml 20.56 ml/m  RA Volume:   50.30 ml  22.88 ml/m LA Vol (A4C):   38.7 ml 17.60 ml/m LA Biplane Vol: 41.0 ml 18.65 ml/m  AORTIC VALVE  PULMONIC VALVE AV Area (Vmax):    2.23 cm      PV Vmax:       1.23 m/s AV Area (Vmean):   2.08 cm      PV Peak grad:  6.1 mmHg AV Area (VTI):     2.04 cm AV Vmax:           173.00 cm/s AV Vmean:          128.000 cm/s AV VTI:            0.339 m AV Peak Grad:      12.0 mmHg AV Mean Grad:      7.0 mmHg LVOT Vmax:         123.00 cm/s LVOT Vmean:        84.800 cm/s LVOT VTI:          0.220 m LVOT/AV VTI ratio: 0.65  AORTA Ao Root diam: 2.70 cm MITRAL VALVE               TRICUSPID VALVE MV Area (PHT): 4.08 cm    TR Peak grad:   26.4 mmHg MV Area VTI:   3.47 cm    TR Vmax:        257.00 cm/s MV Peak grad:  4.7 mmHg MV Mean grad:  2.0 mmHg    SHUNTS MV Vmax:       1.08 m/s    Systemic VTI:  0.22 m MV Vmean:      61.7  cm/s   Systemic Diam: 2.00 cm MV Decel Time: 186 msec MV E velocity: 87.50 cm/s MV A velocity: 55.90 cm/s MV E/A ratio:  1.57 Lorine Bears MD Electronically signed by Lorine Bears MD Signature Date/Time: 03/07/2023/3:17:23 PM    Final    CT CHEST ABDOMEN PELVIS W CONTRAST  Result Date: 03/06/2023 CLINICAL DATA:  Sepsis EXAM: CT CHEST, ABDOMEN, AND PELVIS WITH CONTRAST TECHNIQUE: Multidetector CT imaging of the chest, abdomen and pelvis was performed following the standard protocol during bolus administration of intravenous contrast. RADIATION DOSE REDUCTION: This exam was performed according to the departmental dose-optimization program which includes automated exposure control, adjustment of the mA and/or kV according to patient size and/or use of iterative reconstruction technique. CONTRAST:  OMNIPAQUE IOHEXOL 300 MG/ML  SOLN COMPARISON:  None Available. FINDINGS: CT CHEST FINDINGS Cardiovascular: Small pericardial effusion measuring 6 mm in depth. There is inflammatory stranding within the pericardial fat (image 38/2 ) Mediastinum/Nodes: No axillary or supraclavicular adenopathy. No mediastinal or hilar adenopathy. No pericardial fluid. Esophagus normal. Lungs/Pleura: No airspace disease. No pneumothorax. No pleural fluid. Musculoskeletal: No aggressive osseous lesion. CT ABDOMEN AND PELVIS FINDINGS Hepatobiliary: No focal hepatic lesion. Postcholecystectomy. Common bile duct mildly dilated 12 mm following cholecystectomy no biliary dilatation. Pancreas: Pancreas is normal. No ductal dilatation. No pancreatic inflammation. Spleen: Normal spleen Adrenals/urinary tract: Adrenal glands normal. Bilateral renal cortical scarring. 15 mm simple fluid attenuation LEFT renal cyst on postcontrast imaging is present. Small nonenhancing cyst of the RIGHT kidney. No enhancing renal cortical lesion. The ureters and bladder normal. Stomach/Bowel: The stomach, duodenum, and small bowel normal. The colon and  rectosigmoid colon are normal. Vascular/Lymphatic: Abdominal aorta is normal caliber. There is no retroperitoneal or periportal lymphadenopathy. No pelvic lymphadenopathy. Reproductive: Uterus and adnexa unremarkable. Other: No free fluid. Musculoskeletal: No aggressive osseous lesion. IMPRESSION: CHEST IMPRESSION: 1. Small pericardial effusion and inflammation in the pericardial fat. Recommend correlation for pericarditis. 2. No acute pulmonary findings. PELVIS IMPRESSION: 1. No acute findings in  the abdomen pelvis. 2. Bilateral renal cortical scarring and benign Bosniak 1 renal cysts. No follow-up recommended for benign renal lesion. Electronically Signed   By: Genevive Bi M.D.   On: 03/06/2023 15:36   DG Chest 2 View  Result Date: 03/06/2023 CLINICAL DATA:  Chest pain EXAM: CHEST - 2 VIEW COMPARISON:  Chest radiograph 10/21/2022. FINDINGS: The left chest wall port is stable in position. The cardiomediastinal silhouette is stable. There is no focal consolidation or pulmonary edema. There is no pleural effusion or pneumothorax. Pleural thickening along the right lateral chest wall is unchanged. There is no acute osseous abnormality. IMPRESSION: Stable chest with no radiographic evidence of acute cardiopulmonary process. Electronically Signed   By: Lesia Hausen M.D.   On: 03/06/2023 08:03    Microbiology: Results for orders placed or performed during the hospital encounter of 03/06/23  SARS Coronavirus 2 by RT PCR (hospital order, performed in [facility] hospital lab) *cepheid single result test* Anterior Nasal Swab     Status: None   Collection Time: 03/06/23  9:18 AM   Specimen: Anterior Nasal Swab  Result Value Ref Range Status   SARS Coronavirus 2 by RT PCR NEGATIVE NEGATIVE Final    Comment: (NOTE) SARS-CoV-2 target nucleic acids are NOT DETECTED.  The SARS-CoV-2 RNA is generally detectable in upper and lower respiratory specimens during the acute phase of infection. The  lowest concentration of SARS-CoV-2 viral copies this assay can detect is 250 copies / mL. A negative result does not preclude SARS-CoV-2 infection and should not be used as the sole basis for treatment or other patient management decisions.  A negative result may occur with improper specimen collection / handling, submission of specimen other than nasopharyngeal swab, presence of viral mutation(s) within the areas targeted by this assay, and inadequate number of viral copies (<250 copies / mL). A negative result must be combined with clinical observations, patient history, and epidemiological information.  Fact Sheet for Patients:   RoadLapTop.co.za  Fact Sheet for Healthcare Providers: http://kim-miller.com/  This test is not yet approved or  cleared by the [place] FDA and has been authorized for detection and/or diagnosis of SARS-CoV-2 by FDA under an Emergency Use Authorization (EUA).  This EUA will remain in effect (meaning this test can be used) for the duration of the COVID-19 declaration under Section 564(b)(1) of the Act, 21 U.S.C. section 360bbb-3(b)(1), unless the authorization is terminated or revoked sooner.  Performed at [facility], [address] [place], [place] [address]   Blood Culture (routine x 2)     Status: None   Collection Time: 03/06/23 10:25 AM   Specimen: BLOOD LEFT ARM  Result Value Ref Range Status   Specimen Description BLOOD LEFT ARM  Final   Special Requests   Final    BOTTLES DRAWN AEROBIC AND ANAEROBIC Blood Culture adequate volume   Culture   Final    NO GROWTH 5 DAYS Performed at [facility], [address] [place], [place] [address]    Report Status 03/11/2023 FINAL  Final  Blood Culture (routine x 2)     Status: None   Collection Time: 03/06/23 10:25 AM   Specimen: BLOOD LEFT ARM  Result Value Ref Range Status   Specimen Description BLOOD LEFT ARM  Final    Special Requests   Final    BOTTLES DRAWN AEROBIC AND ANAEROBIC Blood Culture adequate volume   Culture   Final    NO GROWTH 5 DAYS Performed at  [facility], [address] [place], [place] [address]    Report Status 03/11/2023 FINAL  Final  Culture, blood (Routine X 2) w Reflex to ID Panel     Status: None (Preliminary result)   Collection Time: 03/07/23  9:33 PM   Specimen: BLOOD  Result Value Ref Range Status   Specimen Description BLOOD BLOOD LEFT ARM  Final   Special Requests   Final    BOTTLES DRAWN AEROBIC AND ANAEROBIC Blood Culture adequate volume   Culture   Final    NO GROWTH 4 DAYS Performed at [facility], [address], [place], [place] [address]    Report Status PENDING  Incomplete  Culture, blood (Routine X 2) w Reflex to ID Panel     Status: None (Preliminary result)   Collection Time: 03/07/23  9:33 PM   Specimen: BLOOD  Result Value Ref Range Status   Specimen Description BLOOD BLOOD LEFT HAND  Final   Special Requests   Final    BOTTLES DRAWN AEROBIC AND ANAEROBIC Blood Culture adequate volume   Culture   Final    NO GROWTH 4 DAYS Performed at [facility], [address] [place], [place] [address]    Report Status PENDING  Incomplete  Respiratory (~20 pathogens) panel by PCR     Status: None   Collection Time: 03/08/23  7:55 AM   Specimen: Nasopharyngeal Swab; Respiratory  Result Value Ref Range Status   Adenovirus NOT DETECTED NOT DETECTED Final   Coronavirus 229E NOT DETECTED NOT DETECTED Final    Comment: (NOTE) The Coronavirus on the Respiratory Panel, DOES NOT test for the novel  Coronavirus (2019 nCoV)    Coronavirus HKU1 NOT DETECTED NOT DETECTED Final   Coronavirus NL63 NOT DETECTED NOT DETECTED Final   Coronavirus OC43 NOT DETECTED NOT DETECTED Final   Metapneumovirus NOT DETECTED NOT DETECTED Final   Rhinovirus / Enterovirus NOT DETECTED NOT DETECTED Final   Influenza A NOT DETECTED NOT DETECTED Final    Influenza B NOT DETECTED NOT DETECTED Final   Parainfluenza Virus 1 NOT DETECTED NOT DETECTED Final   Parainfluenza Virus 2 NOT DETECTED NOT DETECTED Final   Parainfluenza Virus 3 NOT DETECTED NOT DETECTED Final   Parainfluenza Virus 4 NOT DETECTED NOT DETECTED Final   Respiratory Syncytial Virus NOT DETECTED NOT DETECTED Final   Bordetella pertussis NOT DETECTED NOT DETECTED Final   Bordetella Parapertussis NOT DETECTED NOT DETECTED Final   Chlamydophila pneumoniae NOT DETECTED NOT DETECTED Final   Mycoplasma pneumoniae NOT DETECTED NOT DETECTED Final    Comment: Performed at [facility], [address] [place], [place] [address]    Labs: CBC: Recent Labs  Lab 03/06/23 0918 03/07/23 0500 03/09/23 0550  WBC 11.5* 9.7 8.3  HGB 14.4 11.7* 11.6*  HCT 43.0 35.0* 34.7*  MCV 87.0 87.9 87.2  PLT 148* 122* 141*   Basic Metabolic Panel: Recent Labs  Lab 03/06/23 0918 03/06/23 1958 03/07/23 0500 03/09/23 0550 03/10/23 0619  NA 136  --  135 136  --   K 4.9  --  4.3 4.1  --   CL 103  --  105 104  --   CO2 22  --  23 23  --   GLUCOSE 178*  --  187* 180*  --   BUN 19  --  19 16  --   CREATININE 1.05*  --  1.07* 0.98 0.98  CALCIUM 9.4  --  8.5* 8.5*  --  MG  --  1.2*  --  1.3* 2.0   Liver Function Tests: Recent Labs  Lab 03/06/23 0918  AST 34  ALT 25  ALKPHOS 67  BILITOT 1.2  PROT 7.8  ALBUMIN 4.3   CBG: Recent Labs  Lab 03/10/23 1137 03/10/23 1549 03/10/23 2048 03/11/23 0830 03/11/23 1153  GLUCAP 265* 189* 150* 134* 159*    Discharge time spent: greater than 30 minutes.  Signed: Marcelino Duster, MD [facility] 03/11/2023

## 2023-03-12 LAB — CULTURE, BLOOD (ROUTINE X 2): Culture: NO GROWTH

## 2023-03-14 DIAGNOSIS — F411 Generalized anxiety disorder: Secondary | ICD-10-CM | POA: Diagnosis not present

## 2023-03-14 DIAGNOSIS — F4310 Post-traumatic stress disorder, unspecified: Secondary | ICD-10-CM | POA: Diagnosis not present

## 2023-03-14 DIAGNOSIS — F3181 Bipolar II disorder: Secondary | ICD-10-CM | POA: Diagnosis not present

## 2023-03-17 ENCOUNTER — Encounter: Payer: Self-pay | Admitting: Psychiatry

## 2023-03-17 ENCOUNTER — Telehealth (INDEPENDENT_AMBULATORY_CARE_PROVIDER_SITE_OTHER): Payer: Medicaid Other | Admitting: Psychiatry

## 2023-03-17 DIAGNOSIS — Z9189 Other specified personal risk factors, not elsewhere classified: Secondary | ICD-10-CM | POA: Diagnosis not present

## 2023-03-17 DIAGNOSIS — F4310 Post-traumatic stress disorder, unspecified: Secondary | ICD-10-CM | POA: Diagnosis not present

## 2023-03-17 DIAGNOSIS — F3176 Bipolar disorder, in full remission, most recent episode depressed: Secondary | ICD-10-CM | POA: Diagnosis not present

## 2023-03-17 DIAGNOSIS — F411 Generalized anxiety disorder: Secondary | ICD-10-CM

## 2023-03-17 DIAGNOSIS — G4701 Insomnia due to medical condition: Secondary | ICD-10-CM | POA: Diagnosis not present

## 2023-03-17 MED ORDER — VENLAFAXINE HCL ER 37.5 MG PO CP24: 37.5000 mg | ORAL_CAPSULE | Freq: Every day | ORAL | 0 refills | Status: DC

## 2023-03-17 MED ORDER — BENZTROPINE MESYLATE 1 MG PO TABS
1.0000 mg | ORAL_TABLET | Freq: Every day | ORAL | 0 refills | Status: DC | PRN
Start: 2023-03-17 — End: 2023-06-02

## 2023-03-17 MED ORDER — MIRTAZAPINE 15 MG PO TABS
15.0000 mg | ORAL_TABLET | Freq: Every day | ORAL | 0 refills | Status: DC
Start: 2023-03-17 — End: 2023-06-04

## 2023-03-17 MED ORDER — LURASIDONE HCL 20 MG PO TABS: 20.0000 mg | ORAL_TABLET | Freq: Every day | ORAL | 0 refills | Status: DC

## 2023-03-17 MED ORDER — VENLAFAXINE HCL ER 75 MG PO CP24: 75.0000 mg | ORAL_CAPSULE | Freq: Every day | ORAL | 0 refills | Status: DC

## 2023-03-17 NOTE — Progress Notes (Unsigned)
Virtual Visit via Video Note  I connected with Alison [name] on 03/17/23 at  4:30 PM EDT by a video enabled telemedicine application and verified that I am speaking with the correct person using two identifiers.  Location Provider Location : [facility] Patient Location : Home  Participants: Patient , Provider   I discussed the limitations of evaluation and management by telemedicine and the availability of in person appointments. The patient expressed understanding and agreed to proceed.    I discussed the assessment and treatment plan with the patient. The patient was provided an opportunity to ask questions and all were answered. The patient agreed with the plan and demonstrated an understanding of the instructions.   The patient was advised to call back or seek an in-person evaluation if the symptoms worsen or if the condition fails to improve as anticipated.   BH MD OP Progress Note  03/18/2023 12:31 PM DEANNE BEDGOOD  MRN:  [id number]  Chief Complaint:  Chief Complaint  Patient presents with   Follow-up   Medication Refill   Anxiety   Depression   HPI: Alison [name] is a 47 year old Caucasian female, lives in [place], has a history of bipolar disorder, PTSD, medical problems including fibromyalgia, chronic pain, trigeminal neuralgia, history of liver transplant, B-cell lymphoma, SLE, diabetes mellitus, stage IV chronic kidney disease, OSA, chronic migraine headaches, hyperlipidemia, hypertension was evaluated by telemedicine today.  Patient today reports she recently was admitted to Hospital, reviewed records per Dr. Clide Dales, [facility] hospitalist-dated 03/06/2023-patient with diagnosis of sepsis, acute pericarditis, prolonged QT interval, hypomagnesemia, patient at discharge was advised to continue all of her medications including psychotropics, discharged on 03/11/2023.'  Patient today reports although she felt better at discharge since yesterday she has been having chest  pain.  She currently rates it at a 2 out of 10.  It is on the left side radiating down her shoulder.  Patient agrees to get in touch with her primary care provider as well as cardiologist and also go to the nearest emergency department if pain worsens.  She reports the pain does make her worried about better her pericarditis is coming back or not.  That does worsen her anxiety.  Patient currently in psychotherapy with her therapist, increased to get in touch with continued CBT.  Patient denies any suicidality, homicidality or perceptual disturbances.  Patient reports sleep is overall okay.  Patient denies any other concerns today.  Visit Diagnosis:    ICD-10-CM   1. Bipolar 1 disorder, depressed, full remission (HCC)  F31.76 EKG 12-Lead    lurasidone (LATUDA) 20 MG TABS tablet    benztropine (COGENTIN) 1 MG tablet    mirtazapine (REMERON) 15 MG tablet    2. PTSD (post-traumatic stress disorder)  F43.10 EKG 12-Lead    venlafaxine XR (EFFEXOR XR) 75 MG 24 hr capsule    venlafaxine XR (EFFEXOR XR) 37.5 MG 24 hr capsule    lurasidone (LATUDA) 20 MG TABS tablet    benztropine (COGENTIN) 1 MG tablet    mirtazapine (REMERON) 15 MG tablet    3. GAD (generalized anxiety disorder)  F41.1 venlafaxine XR (EFFEXOR XR) 75 MG 24 hr capsule    venlafaxine XR (EFFEXOR XR) 37.5 MG 24 hr capsule    lurasidone (LATUDA) 20 MG TABS tablet    benztropine (COGENTIN) 1 MG tablet    mirtazapine (REMERON) 15 MG tablet    4. Insomnia due to medical condition  G47.01    mood, pain    5. At  risk for prolonged QT interval syndrome  Z91.89 EKG 12-Lead      Past Psychiatric History: I have reviewed past psychiatric history from progress note on 06/10/2019.  Past Medical History:  Past Medical History:  Diagnosis Date   Abnormal uterine bleeding    Allergy    Anxiety    Arthritis    Bipolar disorder (manic depression) (HCC)    Chronic kidney failure    Chronic renal disease, stage III (HCC)    Diabetes  mellitus without complication (HCC)    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HCC) 2015   Right axillary lymph node resected and chemo tx's.   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   Chemo tx's   Migraine    Morbid obesity (HCC)    Neuromuscular disorder (HCC)    neuropathy   Neuropathy    Personality disorder (HCC)    Pineal gland cyst    Post herpetic neuralgia    PTSD (post-traumatic stress disorder)    Renal disorder    S/P liver transplant (HCC) 1993   Sleep apnea    has not recieved cpap yet-other one was recalled    Past Surgical History:  Procedure Laterality Date   BONE MARROW BIOPSY  01/13/2015   BREAST BIOPSY  12/2014   BREAST BIOPSY  2011   BREAST SURGERY     CHOLECYSTECTOMY     COLONOSCOPY     COLONOSCOPY WITH PROPOFOL N/A 02/28/2022   Procedure: COLONOSCOPY WITH PROPOFOL;  Surgeon: Midge Minium, MD;  Location: [facility] ENDOSCOPY;  Service: Endoscopy;  Laterality: N/A;   ESOPHAGOGASTRODUODENOSCOPY     ESOPHAGOGASTRODUODENOSCOPY (EGD) WITH PROPOFOL N/A 01/09/2021   Procedure: ESOPHAGOGASTRODUODENOSCOPY (EGD) WITH PROPOFOL;  Surgeon: Midge Minium, MD;  Location: [facility] ENDOSCOPY;  Service: Endoscopy;  Laterality: N/A;   HERNIA REPAIR     x4-all from liver transplant   HYSTEROSCOPY WITH D & C N/A 11/13/2021   Procedure: DILATATION AND CURETTAGE /HYSTEROSCOPY;  Surgeon: Natale Milch, MD;  Location: [facility] ORS;  Service: Gynecology;  Laterality: N/A;   infusaport     LIVER TRANSPLANT  12/17/1991   LUMBAR PUNCTURE     PORT A CATH INJECTION ([facility] HX)     RENAL BIOPSY     tumor removal  2015   cancer right side    Family Psychiatric History: I have reviewed family psychiatric  history from progress note on 06/10/2019.  Family History:  Family History  Problem Relation Age of Onset   Heart disease Mother    Hypertension Mother    Cancer - Other Mother    Bipolar disorder Mother    Heart disease Father    Hypertension Father    Diabetes Father    Parkinson's disease Maternal Grandmother    Cancer Maternal Aunt    Cancer Maternal Uncle    Cancer Maternal Grandfather    Lupus Paternal Grandmother    Hypertension Brother     Social History: I have reviewed social history from progress note on 06/10/2019. Social History   Socioeconomic History   Marital status: Single    Spouse name: Not on file  Number of children: 0   Years of education: 9   Highest education level: GED or equivalent  Occupational History   Occupation: disabled  Tobacco Use   Smoking status: Never   Smokeless tobacco: Never  Vaping Use   Vaping Use: Never used  Substance and Sexual Activity   Alcohol use: Not Currently   Drug use: Not Currently    Comment: pain managment last used in early april   Sexual activity: Not Currently  Other Topics Concern   Not on file  Social History Narrative   Not on file   Social Determinants of Health   Financial Resource Strain: Low Risk  (10/15/2022)   Overall Financial Resource Strain (CARDIA)    Difficulty of Paying Living Expenses: Not very hard  Food Insecurity: No Food Insecurity (03/06/2023)   Hunger Vital Sign    Worried About Running Out of Food in the Last Year: Never true    Ran Out of Food in the Last Year: Never true  Transportation Needs: No Transportation Needs (03/06/2023)   PRAPARE - Administrator, Civil Service (Medical): No    Lack of Transportation (Non-Medical): No  Physical Activity: Inactive (10/15/2022)   Exercise Vital Sign    Days of Exercise per Week: 0 days    Minutes of Exercise per Session: 0 min  Stress: Stress Concern Present (11/11/2022)   [employer] of Occupational Health - Occupational  Stress Questionnaire    Feeling of Stress : To some extent  Social Connections: Moderately Integrated (02/19/2023)   Social Connection and Isolation Panel [NHANES]    Frequency of Communication with Friends and Family: More than three times a week    Frequency of Social Gatherings with Friends and Family: More than three times a week    Attends Religious Services: More than 4 times per year    Active Member of [employer] or Organizations: No    Attends Banker Meetings: Never    Marital Status: Living with partner    Allergies:  Allergies  Allergen Reactions   Penicillin G Itching, Rash, Anaphylaxis and Palpitations   Lamictal [Lamotrigine] Rash   Carbamazepine Other (See Comments)    Medication interaction-prograf    Hydrocodone-Acetaminophen Itching   Naproxen Itching   Penicillins    Vancomycin Rash    Macular/blotchy rash at infusion site    Metabolic Disorder Labs: Lab Results  Component Value Date   HGBA1C 8.8 (H) 03/07/2023   MPG 206 03/07/2023   MPG 209 06/05/2020   Lab Results  Component Value Date   PROLACTIN 16.4 12/12/2022   PROLACTIN 13.4 07/15/2019   Lab Results  Component Value Date   CHOL 66 03/08/2023   TRIG 87 03/08/2023   HDL 30 (L) 03/08/2023   CHOLHDL 2.2 03/08/2023   VLDL 17 03/08/2023   LDLCALC 19 03/08/2023   LDLCALC 19 05/27/2022   Lab Results  Component Value Date   TSH 4.878 (H) 03/09/2023   TSH 2.650 07/15/2019    Therapeutic Level Labs: No results found for: "LITHIUM" No results found for: "VALPROATE" No results found for: "CBMZ"  Current Medications: Current Outpatient Medications  Medication Sig Dispense Refill   baclofen (LIORESAL) 10 MG tablet Take 1 tablet by mouth 2 (two) times daily.     benztropine (COGENTIN) 1 MG tablet Take 1 tablet (1 mg total) by mouth daily as needed for tremors. 90 tablet 0   Cholecalciferol (VITAMIN D) 125 MCG (5000 UT) CAPS Take 5,000 Units by mouth  daily.     colchicine 0.6 MG  tablet Take 1 tablet (0.6 mg total) by mouth 2 (two) times daily. 60 tablet 1   dronabinol (MARINOL) 2.5 MG capsule Take 2.5 mg by mouth See admin instructions. Takes every other day (Patient not taking: Reported on 03/06/2023)     Dulaglutide 4.5 MG/0.5ML SOPN Inject 4.5 mg into the skin once a week.     ezetimibe (ZETIA) 10 MG tablet Take 10 mg by mouth every morning.     ibuprofen (ADVIL) 400 MG tablet Take 1 tablet (400 mg total) by mouth every 6 (six) hours as needed. 30 tablet 0   icosapent Ethyl (VASCEPA) 1 g capsule Take 2 capsules (2 g total) by mouth 2 (two) times daily. 120 capsule 5   Insulin Degludec (TRESIBA) 100 UNIT/ML SOLN Inject 30 Units into the skin. (Patient not taking: Reported on 03/06/2023)     Insulin Glargine (BASAGLAR KWIKPEN) 100 UNIT/ML Inject 40 Units into the skin at bedtime.     lisinopril (ZESTRIL) 20 MG tablet TAKE ONE TABLET BY MOUTH ONCE DAILY 90 tablet 3   lurasidone (LATUDA) 20 MG TABS tablet Take 1 tablet (20 mg total) by mouth daily with supper. 90 tablet 0   magnesium oxide (MAG-OX) 400 MG tablet Take by mouth. (Patient not taking: Reported on 03/06/2023)     Melatonin 10 MG TABS Take 10 mg by mouth at bedtime.     meloxicam (MOBIC) 15 MG tablet Take 1 tablet (15 mg total) by mouth daily. (Patient not taking: Reported on 03/06/2023) 30 tablet 0   metFORMIN (GLUCOPHAGE) 1000 MG tablet Take 1 tablet (1,000 mg total) by mouth 2 (two) times daily with a meal. 180 tablet 3   methocarbamol (ROBAXIN) 500 MG tablet Take 1 tablet (500 mg total) by mouth 4 (four) times daily. (Patient not taking: Reported on 03/06/2023) 16 tablet 0   mirabegron ER (MYRBETRIQ) 50 MG TB24 tablet Take 1 tablet (50 mg total) by mouth daily. 90 tablet 3   mirtazapine (REMERON) 15 MG tablet Take 1 tablet (15 mg total) by mouth at bedtime. TAKE ONE TABLET BY MOUTH EVERYDAY AT BEDTIME 90 tablet 0   naloxone (NARCAN) nasal spray 4 mg/0.1 mL Place 0.4 mg into the nose once.     NOVOLOG FLEXPEN 100  UNIT/ML FlexPen Inject 0-30 Units into the skin 3 (three) times daily with meals. Sliding scale     PRECISION QID TEST test strip      pregabalin (LYRICA) 200 MG capsule Take 1 capsule (200 mg total) by mouth 2 (two) times daily. 60 capsule 0   rosuvastatin (CRESTOR) 10 MG tablet Take 1 tablet (10 mg total) by mouth daily. 90 tablet 2   tacrolimus (PROGRAF) 1 MG capsule Take 1 capsule by mouth every morning.     UBRELVY 50 MG TABS Take 1 tablet by mouth daily as needed.     venlafaxine XR (EFFEXOR XR) 37.5 MG 24 hr capsule Take 1 capsule (37.5 mg total) by mouth daily with breakfast. Take along with 75 mg daily 90 capsule 0   venlafaxine XR (EFFEXOR XR) 75 MG 24 hr capsule Take 1 capsule (75 mg total) by mouth daily with breakfast. 90 capsule 0   XTAMPZA ER 9 MG C12A Take 1 capsule by mouth every 12 (twelve) hours.     No current facility-administered medications for this visit.     Musculoskeletal: Strength & Muscle Tone:  UTA Gait & Station:  Seated Patient leans: N/A  Psychiatric Specialty Exam: Review of Systems  Cardiovascular:  Positive for chest pain (when moves around , rates at 2/10).  Psychiatric/Behavioral:  The patient is nervous/anxious.     There were no vitals taken for this visit.There is no height or weight on file to calculate BMI.  General Appearance: Casual  Eye Contact:  Fair  Speech:  Clear and Coherent  Volume:  Normal  Mood:  Anxious  Affect:  Congruent  Thought Process:  Goal Directed and Descriptions of Associations: Intact  Orientation:  Full (Time, Place, and Person)  Thought Content: Logical   Suicidal Thoughts:  No  Homicidal Thoughts:  No  Memory:  Immediate;   Fair Recent;   Fair Remote;   Fair  Judgement:  Fair  Insight:  Fair  Psychomotor Activity:  Normal  Concentration:  Concentration: Fair and Attention Span: Fair  Recall:  [employer] of Knowledge: Fair  Language: Fair  Akathisia:  No  Handed:  Right  AIMS (if indicated): not done   Assets:  Communication Skills Desire for Improvement Housing Social Support  ADL's:  Intact  Cognition: WNL  Sleep:  Fair   Screenings: Midwife Visit from 12/12/2022 in [facility] Office Visit from 09/12/2022 in [facility] Office Visit from 07/08/2022 in [facility] Video Visit from 03/18/2022 in [facility] Video Visit from 02/14/2022 in [facility]  AIMS Total Score 0 0 0 0 0      GAD-7    Flowsheet Row Video Visit from 03/17/2023 in [facility] Office Visit from 01/22/2023 in [facility] Office Visit from 12/12/2022 in [facility] Office Visit from 09/12/2022 in [facility] Video Visit from 08/01/2022 in [facility]  Total GAD-7 Score 14 8 7 3 4       PHQ2-9    Flowsheet Row Video Visit from 03/17/2023 in [facility] Office Visit from 01/22/2023 in [facility] Office Visit from 12/12/2022 in [facility] Office Visit from 09/12/2022 in [facility] Video Visit [facility]  PHQ-2 Total Score 0 4 2 0 1  PHQ-9 Total Score -- 11 7 -- 7      Flowsheet Row Video Visit from 03/17/2023 in [facility] ED to Hosp-Admission (Discharged) from 03/06/2023 in [facility] 1C MEDICAL TELEMETRY Office Visit from 12/12/2022 in [facility]  C-SSRS RISK CATEGORY Low Risk No Risk No Risk         Assessment and Plan: KAHMIYAH FRANCE is a 47 year old Caucasian female on disability with bipolar disorder, PTSD, multiple medical problems was evaluated by telemedicine today.  Patient with recent hospital admission for sepsis/pericarditis, continues to struggle with chest pain, which is leading to worsening anxiety, will benefit from the following plan.  Plan Bipolar disorder type I depressed in remission Latuda 20 mg p.o. daily with supper Venlafaxine 112.5 mg p.o. daily. Cogentin 1 mg p.o. daily as needed for side effects.  GAD-unstable Patient will need an EKG repeated prior to adding or changing psychotropics due to recent QT prolongation.  This was discussed with patient.  Patient agrees to reach out  to primary care provider/cardiology.  I have also ordered an EKG in the system and she could call [phone] to get this repeated. For now will continue mirtazapine 15 mg p.o. nightly Patient advised to reach out to therapist and continue CBT.  PTSD-stable Venlafaxine as prescribed Continue CBT  Insomnia-improving Mirtazapine 15 mg p.o. nightly Continue CPAP Melatonin 5 to 10 mg p.o. nightly Continue sleep hygiene techniques  At risk for prolonged QT syndrome-patient will need repeat EKG since most recent EKG which was reviewed 03/06/2023-shows ventricular tachycardia, QTc 516.  Patient however was stabilized and discharged from the hospital and I have reviewed notes from [facility] hospitalist as noted above.  At the time of her discharge she was continued on all her psychotropics.  Patient however currently with chest pain, will recommend EKG repeat since she is on multiple psychotropics. Will consider adding a low dosage hydroxyzine as needed or increasing her dosage of venlafaxine if she has continued anxiety.  Will not recommend benzodiazepines for this patient since she is on medications like Xtampza ER.  Follow-up in clinic in 4 weeks or sooner if needed.  Collaboration of  Care: Collaboration of Care: Other I have reviewed notes per [facility] hospitalist as noted above.  Patient/Guardian was advised Release of Information must be obtained prior to any record release in order to collaborate their care with an outside provider. Patient/Guardian was advised if they have not already done so to contact the registration department to sign all necessary forms in order for [place] to release information regarding their care.   Consent: Patient/Guardian gives verbal consent for treatment and assignment of benefits for services provided during this visit. Patient/Guardian expressed understanding and agreed to proceed.   This note was generated in part or whole with voice recognition software. Voice recognition is usually quite accurate but there are transcription errors that can and very often do occur. I apologize for any typographical errors that were not detected and corrected.    [name], MD 03/18/2023, 12:31 PM

## 2023-03-18 NOTE — Progress Notes (Unsigned)
There were no vitals taken for this visit.   Subjective:    Patient ID: Alison [name], female    DOB: 1975-12-05, 47 y.o.   MRN: [id number]  HPI: Alison [name] is a 47 y.o. female  No chief complaint on file.   Relevant past medical, surgical, family and social history reviewed and updated as indicated. Interim medical history since our last visit reviewed. Allergies and medications reviewed and updated.  Review of Systems  Constitutional: Negative for fever or weight change.  Respiratory: Negative for cough and shortness of breath.   Cardiovascular: Negative for chest pain or palpitations.  Gastrointestinal: Negative for abdominal pain, no bowel changes.  Musculoskeletal: Negative for gait problem or joint swelling.  Skin: Negative for rash.  Neurological: Negative for dizziness or headache.  No other specific complaints in a complete review of systems (except as listed in HPI above).      Objective:    There were no vitals taken for this visit.  Wt Readings from Last 3 Encounters:  03/06/23 250 lb (113.4 kg)  01/22/23 252 lb 3.2 oz (114.4 kg)  12/03/22 255 lb 8 oz (115.9 kg)    Physical Exam  Constitutional: Patient appears well-developed and well-nourished. Obese *** No distress.  HEENT: head atraumatic, normocephalic, pupils equal and reactive to light, ears ***, neck supple, throat within normal limits Cardiovascular: Normal rate, regular rhythm and normal heart sounds.  No murmur heard. No BLE edema. Pulmonary/Chest: Effort normal and breath sounds normal. No respiratory distress. Abdominal: Soft.  There is no tenderness. Psychiatric: Patient has a normal mood and affect. behavior is normal. Judgment and thought content normal.  Results for orders placed or performed during the hospital encounter of 03/06/23  SARS Coronavirus 2 by RT PCR (hospital order, performed in [facility] hospital lab) *cepheid single result test* Anterior Nasal Swab   Specimen:  Anterior Nasal Swab  Result Value Ref Range   SARS Coronavirus 2 by RT PCR NEGATIVE NEGATIVE  Blood Culture (routine x 2)   Specimen: BLOOD LEFT ARM  Result Value Ref Range   Specimen Description BLOOD LEFT ARM    Special Requests      BOTTLES DRAWN AEROBIC AND ANAEROBIC Blood Culture adequate volume   Culture      NO GROWTH 5 DAYS Performed at [facility], [address] [place], [place] [address]    Report Status 03/11/2023 FINAL   Blood Culture (routine x 2)   Specimen: BLOOD LEFT ARM  Result Value Ref Range   Specimen Description BLOOD LEFT ARM    Special Requests      BOTTLES DRAWN AEROBIC AND ANAEROBIC Blood Culture adequate volume   Culture      NO GROWTH 5 DAYS Performed at [facility], [address] [place], [place] [address]    Report Status 03/11/2023 FINAL   Culture, blood (Routine X 2) w Reflex to ID Panel   Specimen: BLOOD  Result Value Ref Range   Specimen Description BLOOD BLOOD LEFT ARM    Special Requests      BOTTLES DRAWN AEROBIC AND ANAEROBIC Blood Culture adequate volume   Culture      NO GROWTH 5 DAYS Performed at [facility], [address] [place], [place] [address]    Report Status 03/12/2023 FINAL   Culture, blood (Routine X 2) w Reflex to ID Panel   Specimen: BLOOD  Result Value Ref Range   Specimen Description BLOOD BLOOD LEFT HAND  Special Requests      BOTTLES DRAWN AEROBIC AND ANAEROBIC Blood Culture adequate volume   Culture      NO GROWTH 5 DAYS Performed at [facility], [address] [place], [place] [address]    Report Status 03/12/2023 FINAL   Respiratory (~20 pathogens) panel by PCR   Specimen: Nasopharyngeal Swab; Respiratory  Result Value Ref Range   Adenovirus NOT DETECTED NOT DETECTED   Coronavirus 229E NOT DETECTED NOT DETECTED   Coronavirus HKU1 NOT DETECTED NOT DETECTED   Coronavirus NL63 NOT DETECTED NOT DETECTED   Coronavirus OC43 NOT DETECTED NOT DETECTED    Metapneumovirus NOT DETECTED NOT DETECTED   Rhinovirus / Enterovirus NOT DETECTED NOT DETECTED   Influenza A NOT DETECTED NOT DETECTED   Influenza B NOT DETECTED NOT DETECTED   Parainfluenza Virus 1 NOT DETECTED NOT DETECTED   Parainfluenza Virus 2 NOT DETECTED NOT DETECTED   Parainfluenza Virus 3 NOT DETECTED NOT DETECTED   Parainfluenza Virus 4 NOT DETECTED NOT DETECTED   Respiratory Syncytial Virus NOT DETECTED NOT DETECTED   Bordetella pertussis NOT DETECTED NOT DETECTED   Bordetella Parapertussis NOT DETECTED NOT DETECTED   Chlamydophila pneumoniae NOT DETECTED NOT DETECTED   Mycoplasma pneumoniae NOT DETECTED NOT DETECTED  CBC  Result Value Ref Range   WBC 11.5 (H) 4.0 - 10.5 K/uL   RBC 4.94 3.87 - 5.11 MIL/uL   Hemoglobin 14.4 12.0 - 15.0 g/dL   HCT 60.4 54.0 - 98.1 %   MCV 87.0 80.0 - 100.0 fL   MCH 29.1 26.0 - 34.0 pg   MCHC 33.5 30.0 - 36.0 g/dL   RDW 19.1 47.8 - 29.5 %   Platelets 148 (L) 150 - 400 K/uL   nRBC 0.0 0.0 - 0.2 %  Lactic acid, plasma  Result Value Ref Range   Lactic Acid, Venous 2.3 (HH) 0.5 - 1.9 mmol/L  Lactic acid, plasma  Result Value Ref Range   Lactic Acid, Venous 1.9 0.5 - 1.9 mmol/L  Comprehensive metabolic panel  Result Value Ref Range   Sodium 136 135 - 145 mmol/L   Potassium 4.9 3.5 - 5.1 mmol/L   Chloride 103 98 - 111 mmol/L   CO2 22 22 - 32 mmol/L   Glucose, Bld 178 (H) 70 - 99 mg/dL   BUN 19 6 - 20 mg/dL   Creatinine, Ser 6.21 (H) 0.44 - 1.00 mg/dL   Calcium 9.4 8.9 - 30.8 mg/dL   Total Protein 7.8 6.5 - 8.1 g/dL   Albumin 4.3 3.5 - 5.0 g/dL   AST 34 15 - 41 U/L   ALT 25 0 - 44 U/L   Alkaline Phosphatase 67 38 - 126 U/L   Total Bilirubin 1.2 0.3 - 1.2 mg/dL   GFR, Estimated >65 >78 mL/min   Anion gap 11 5 - 15  Protime-INR  Result Value Ref Range   Prothrombin Time 13.7 11.4 - 15.2 seconds   INR 1.0 0.8 - 1.2  APTT  Result Value Ref Range   aPTT 32 24 - 36 seconds  Procalcitonin  Result Value Ref Range   Procalcitonin  <0.10 ng/mL  Urinalysis, Routine w reflex microscopic -Urine, Clean Catch  Result Value Ref Range   Color, Urine YELLOW (A) YELLOW   APPearance HAZY (A) CLEAR   Specific Gravity, Urine 1.015 1.005 - 1.030   pH 5.0 5.0 - 8.0   Glucose, UA NEGATIVE NEGATIVE mg/dL   Hgb urine dipstick NEGATIVE NEGATIVE   Bilirubin Urine NEGATIVE NEGATIVE  Ketones, ur NEGATIVE NEGATIVE mg/dL   Protein, ur 161 (A) NEGATIVE mg/dL   Nitrite NEGATIVE NEGATIVE   Leukocytes,Ua SMALL (A) NEGATIVE   RBC / HPF 0-5 0 - 5 RBC/hpf   WBC, UA 11-20 0 - 5 WBC/hpf   Bacteria, UA NONE SEEN NONE SEEN   Squamous Epithelial / HPF 0-5 0 - 5 /HPF   Mucus PRESENT    Hyaline Casts, UA PRESENT   HIV Antibody (routine testing w rflx)  Result Value Ref Range   HIV Screen 4th Generation wRfx Non Reactive Non Reactive  Protime-INR  Result Value Ref Range   Prothrombin Time 14.9 11.4 - 15.2 seconds   INR 1.2 0.8 - 1.2  Cortisol-am, blood  Result Value Ref Range   Cortisol - AM 7.2 6.7 - 22.6 ug/dL  Basic metabolic panel  Result Value Ref Range   Sodium 135 135 - 145 mmol/L   Potassium 4.3 3.5 - 5.1 mmol/L   Chloride 105 98 - 111 mmol/L   CO2 23 22 - 32 mmol/L   Glucose, Bld 187 (H) 70 - 99 mg/dL   BUN 19 6 - 20 mg/dL   Creatinine, Ser 0.96 (H) 0.44 - 1.00 mg/dL   Calcium 8.5 (L) 8.9 - 10.3 mg/dL   GFR, Estimated >04 >54 mL/min   Anion gap 7 5 - 15  CBC  Result Value Ref Range   WBC 9.7 4.0 - 10.5 K/uL   RBC 3.98 3.87 - 5.11 MIL/uL   Hemoglobin 11.7 (L) 12.0 - 15.0 g/dL   HCT 09.8 (L) 11.9 - 14.7 %   MCV 87.9 80.0 - 100.0 fL   MCH 29.4 26.0 - 34.0 pg   MCHC 33.4 30.0 - 36.0 g/dL   RDW 82.9 56.2 - 13.0 %   Platelets 122 (L) 150 - 400 K/uL   nRBC 0.0 0.0 - 0.2 %  Hemoglobin A1c  Result Value Ref Range   Hgb A1c MFr Bld 8.8 (H) 4.8 - 5.6 %   Mean Plasma Glucose 206 mg/dL  Magnesium  Result Value Ref Range   Magnesium 1.2 (L) 1.7 - 2.4 mg/dL  Glucose, capillary  Result Value Ref Range   Glucose-Capillary 193  (H) 70 - 99 mg/dL  Glucose, capillary  Result Value Ref Range   Glucose-Capillary 175 (H) 70 - 99 mg/dL  Glucose, capillary  Result Value Ref Range   Glucose-Capillary 271 (H) 70 - 99 mg/dL  C-reactive protein  Result Value Ref Range   CRP 8.8 (H) <1.0 mg/dL  Sedimentation rate  Result Value Ref Range   Sed Rate 46 (H) 0 - 20 mm/hr  Glucose, capillary  Result Value Ref Range   Glucose-Capillary 216 (H) 70 - 99 mg/dL  Lipid panel  Result Value Ref Range   Cholesterol 66 0 - 200 mg/dL   Triglycerides 87 <865 mg/dL   HDL 30 (L) >78 mg/dL   Total CHOL/HDL Ratio 2.2 RATIO   VLDL 17 0 - 40 mg/dL   LDL Cholesterol 19 0 - 99 mg/dL  Glucose, capillary  Result Value Ref Range   Glucose-Capillary 178 (H) 70 - 99 mg/dL  Glucose, capillary  Result Value Ref Range   Glucose-Capillary 148 (H) 70 - 99 mg/dL  Sedimentation rate  Result Value Ref Range   Sed Rate 60 (H) 0 - 20 mm/hr  C-reactive protein  Result Value Ref Range   CRP 10.6 (H) <1.0 mg/dL  Glucose, capillary  Result Value Ref Range   Glucose-Capillary 250 (H) 70 -  99 mg/dL  Glucose, capillary  Result Value Ref Range   Glucose-Capillary 233 (H) 70 - 99 mg/dL  CBC  Result Value Ref Range   WBC 8.3 4.0 - 10.5 K/uL   RBC 3.98 3.87 - 5.11 MIL/uL   Hemoglobin 11.6 (L) 12.0 - 15.0 g/dL   HCT 01.0 (L) 27.2 - 53.6 %   MCV 87.2 80.0 - 100.0 fL   MCH 29.1 26.0 - 34.0 pg   MCHC 33.4 30.0 - 36.0 g/dL   RDW 64.4 03.4 - 74.2 %   Platelets 141 (L) 150 - 400 K/uL   nRBC 0.0 0.0 - 0.2 %  Basic metabolic panel  Result Value Ref Range   Sodium 136 135 - 145 mmol/L   Potassium 4.1 3.5 - 5.1 mmol/L   Chloride 104 98 - 111 mmol/L   CO2 23 22 - 32 mmol/L   Glucose, Bld 180 (H) 70 - 99 mg/dL   BUN 16 6 - 20 mg/dL   Creatinine, Ser 5.95 0.44 - 1.00 mg/dL   Calcium 8.5 (L) 8.9 - 10.3 mg/dL   GFR, Estimated >63 >87 mL/min   Anion gap 9 5 - 15  Magnesium  Result Value Ref Range   Magnesium 1.3 (L) 1.7 - 2.4 mg/dL  Glucose,  capillary  Result Value Ref Range   Glucose-Capillary 190 (H) 70 - 99 mg/dL  Glucose, capillary  Result Value Ref Range   Glucose-Capillary 171 (H) 70 - 99 mg/dL  TSH  Result Value Ref Range   TSH 4.878 (H) 0.350 - 4.500 uIU/mL  Glucose, capillary  Result Value Ref Range   Glucose-Capillary 222 (H) 70 - 99 mg/dL  Glucose, capillary  Result Value Ref Range   Glucose-Capillary 205 (H) 70 - 99 mg/dL  Creatinine, serum  Result Value Ref Range   Creatinine, Ser 0.98 0.44 - 1.00 mg/dL   GFR, Estimated >56 >43 mL/min  Magnesium  Result Value Ref Range   Magnesium 2.0 1.7 - 2.4 mg/dL  Glucose, capillary  Result Value Ref Range   Glucose-Capillary 168 (H) 70 - 99 mg/dL  Glucose, capillary  Result Value Ref Range   Glucose-Capillary 161 (H) 70 - 99 mg/dL  Glucose, capillary  Result Value Ref Range   Glucose-Capillary 265 (H) 70 - 99 mg/dL  Glucose, capillary  Result Value Ref Range   Glucose-Capillary 189 (H) 70 - 99 mg/dL  Glucose, capillary  Result Value Ref Range   Glucose-Capillary 150 (H) 70 - 99 mg/dL  Glucose, capillary  Result Value Ref Range   Glucose-Capillary 134 (H) 70 - 99 mg/dL  Glucose, capillary  Result Value Ref Range   Glucose-Capillary 159 (H) 70 - 99 mg/dL  POC urine preg, ED  Result Value Ref Range   Preg Test, Ur Negative Negative  ECHOCARDIOGRAM COMPLETE  Result Value Ref Range   Weight 4,000 oz   Height 66 in   BP 145/84 mmHg   Ao pk vel 1.73 m/s   AV Area VTI 2.04 cm2   AR max vel 2.23 cm2   AV Mean grad 7.0 mmHg   AV Peak grad 12.0 mmHg   S' Lateral 2.50 cm   AV Area mean vel 2.08 cm2   Area-P 1/2 4.08 cm2   MV VTI 3.47 cm2   Est EF 55 - 60%   Troponin I (High Sensitivity)  Result Value Ref Range   Troponin I (High Sensitivity) 6 <18 ng/L  Troponin I (High Sensitivity)  Result Value Ref Range  Troponin I (High Sensitivity) 6 <18 ng/L      Assessment & Plan:   Problem List Items Addressed This Visit   None    Follow up  plan: No follow-ups on file.

## 2023-03-19 ENCOUNTER — Ambulatory Visit: Payer: Medicaid Other | Admitting: Nurse Practitioner

## 2023-03-19 ENCOUNTER — Other Ambulatory Visit: Payer: Self-pay

## 2023-03-19 ENCOUNTER — Encounter: Payer: Self-pay | Admitting: Nurse Practitioner

## 2023-03-19 VITALS — BP 124/72 | HR 88 | Temp 98.0°F | Resp 18 | Ht 66.5 in | Wt 252.3 lb

## 2023-03-19 DIAGNOSIS — A419 Sepsis, unspecified organism: Secondary | ICD-10-CM

## 2023-03-19 DIAGNOSIS — I300 Acute nonspecific idiopathic pericarditis: Secondary | ICD-10-CM

## 2023-03-19 DIAGNOSIS — Z794 Long term (current) use of insulin: Secondary | ICD-10-CM | POA: Diagnosis not present

## 2023-03-19 DIAGNOSIS — Z7984 Long term (current) use of oral hypoglycemic drugs: Secondary | ICD-10-CM

## 2023-03-19 DIAGNOSIS — E1165 Type 2 diabetes mellitus with hyperglycemia: Secondary | ICD-10-CM | POA: Diagnosis not present

## 2023-03-19 DIAGNOSIS — Z09 Encounter for follow-up examination after completed treatment for conditions other than malignant neoplasm: Secondary | ICD-10-CM

## 2023-03-19 NOTE — Assessment & Plan Note (Signed)
doing better, has all follow up appointments scheduled.

## 2023-03-20 DIAGNOSIS — I10 Essential (primary) hypertension: Secondary | ICD-10-CM | POA: Diagnosis not present

## 2023-03-20 DIAGNOSIS — E7849 Other hyperlipidemia: Secondary | ICD-10-CM | POA: Diagnosis not present

## 2023-03-20 DIAGNOSIS — E1165 Type 2 diabetes mellitus with hyperglycemia: Secondary | ICD-10-CM | POA: Diagnosis not present

## 2023-03-20 NOTE — Progress Notes (Signed)
  Progress Note   Date: 03/20/2023  Patient Name: Alison [name]        MRN#: [id number]  Clarification of the diagnosis of sepsis:   Sepsis ruled out  SIRS with organ dysfunction.

## 2023-03-21 DIAGNOSIS — Z4823 Encounter for aftercare following liver transplant: Secondary | ICD-10-CM | POA: Diagnosis not present

## 2023-03-21 DIAGNOSIS — K7460 Unspecified cirrhosis of liver: Secondary | ICD-10-CM | POA: Diagnosis not present

## 2023-03-21 DIAGNOSIS — E1122 Type 2 diabetes mellitus with diabetic chronic kidney disease: Secondary | ICD-10-CM | POA: Diagnosis not present

## 2023-03-21 DIAGNOSIS — I129 Hypertensive chronic kidney disease with stage 1 through stage 4 chronic kidney disease, or unspecified chronic kidney disease: Secondary | ICD-10-CM | POA: Diagnosis not present

## 2023-03-21 DIAGNOSIS — Z944 Liver transplant status: Secondary | ICD-10-CM | POA: Diagnosis not present

## 2023-03-21 DIAGNOSIS — G500 Trigeminal neuralgia: Secondary | ICD-10-CM | POA: Diagnosis not present

## 2023-03-21 DIAGNOSIS — R932 Abnormal findings on diagnostic imaging of liver and biliary tract: Secondary | ICD-10-CM | POA: Diagnosis not present

## 2023-03-21 DIAGNOSIS — Z5181 Encounter for therapeutic drug level monitoring: Secondary | ICD-10-CM | POA: Diagnosis not present

## 2023-03-21 DIAGNOSIS — E1165 Type 2 diabetes mellitus with hyperglycemia: Secondary | ICD-10-CM | POA: Diagnosis not present

## 2023-03-21 DIAGNOSIS — N183 Chronic kidney disease, stage 3 unspecified: Secondary | ICD-10-CM | POA: Diagnosis not present

## 2023-03-25 ENCOUNTER — Encounter: Payer: Self-pay | Admitting: Obstetrics and Gynecology

## 2023-03-25 ENCOUNTER — Other Ambulatory Visit: Payer: Medicaid Other | Admitting: Obstetrics and Gynecology

## 2023-03-25 NOTE — Patient Outreach (Signed)
Medicaid Managed Care   Nurse Care Manager Note  03/25/2023 Name:  Alison [name] MRN:  [id number] DOB:  1976-02-15  Alison [name] is an 47 y.o. year old female who is a primary patient of Alison Salines, FNP.  The [facility] Managed Care Coordination team was consulted for assistance with:    Chronic healthcare management needs, anxiety/depression/BP/PTSD, OSD, migraines, DM, HTN, chronic pain, SLE, LDD, HLD, fibromyalgia, osteoarthritis, LBP, DLBCL, liver transplant, lymphoma, CKD-3  Alison [name] was given information about Medicaid Managed Care Coordination team services today. [name] Patient agreed to services and verbal consent obtained.  Engaged with patient by telephone for follow up visit in response to provider referral for case management and/or care coordination services.   Assessments/Interventions:  Review of past medical history, allergies, medications, health status, including review of consultants reports, laboratory and other test data, was performed as part of comprehensive evaluation and provision of chronic care management services.  SDOH (Social Determinants of Health) assessments and interventions performed: SDOH Interventions    Flowsheet Row Patient Outreach Telephone from 03/25/2023 in [place] POPULATION HEALTH DEPARTMENT Patient Outreach Telephone from 01/10/2023 in [place] POPULATION HEALTH DEPARTMENT Patient Outreach Telephone from 12/11/2022 in [place] POPULATION HEALTH DEPARTMENT Patient Outreach Telephone from 11/11/2022 in [place] POPULATION HEALTH DEPARTMENT Patient Outreach Telephone from 10/15/2022 in [place] POPULATION HEALTH DEPARTMENT Patient Outreach Telephone from 09/10/2022 in [place] POPULATION HEALTH DEPARTMENT  SDOH Interventions        Food Insecurity Interventions -- Intervention Not Indicated -- -- -- --  Housing Interventions -- -- Intervention Not Indicated -- -- --  Transportation Interventions -- Intervention Not  Indicated -- -- -- --  Utilities Interventions -- -- Intervention Not Indicated -- -- --  Alcohol Usage Interventions -- -- -- Intervention Not Indicated (Score <7) -- --  Financial Strain Interventions Intervention Not Indicated -- -- -- Intervention Not Indicated --  Physical Activity Interventions Other (Comments)  [not able to enage in moderate to strenuous exercise at this level] -- -- -- Other (Comments)  [patient does not feel like exercising right now] --  Stress Interventions -- -- -- Provide Counseling  [sees Psychiatrist on regular basis] -- --  Social Connections Interventions -- -- -- -- -- Intervention Not Indicated     Care Plan  Allergies  Allergen Reactions   Penicillin G Itching, Rash, Anaphylaxis and Palpitations   Lamictal [Lamotrigine] Rash   Carbamazepine Other (See Comments)    Medication interaction-prograf    Hydrocodone-Acetaminophen Itching   Naproxen Itching   Penicillins    Vancomycin Rash    Macular/blotchy rash at infusion site    Medications Reviewed Today     Reviewed by [name], RN (Registered Nurse) on 03/25/23 at 1525  Med List Status: <None>   Medication Order Taking? Sig Documenting Provider Last Dose Status Informant  baclofen (LIORESAL) 10 MG tablet [id number] No Take 1 tablet by mouth 2 (two) times daily. [provider] Taking Active Self, Pharmacy Records  benztropine (COGENTIN) 1 MG tablet [id number] No Take 1 tablet (1 mg total) by mouth daily as needed for tremors. [name], MD Taking Active   Cholecalciferol (VITAMIN D) 125 MCG (5000 UT) CAPS [id number] No Take 5,000 Units by mouth daily. [provider] Taking Active Self, Pharmacy Records  colchicine 0.6 MG tablet [id number] No Take 1 tablet (0.6 mg total) by mouth 2 (two) times daily. Marcelino Duster, MD Taking Active   dronabinol (MARINOL) 2.5 MG capsule  [id number] No Take 2.5 mg by mouth See admin instructions. Takes every other day  Patient not  taking: Reported on 03/06/2023   [provider] Not Taking Active Self, Pharmacy Records  Dulaglutide 4.5 MG/0.5ML SOPN [id number] No Inject 4.5 mg into the skin once a week. [provider] Taking Active Self, Pharmacy Records  ezetimibe (ZETIA) 10 MG tablet [id number] No Take 10 mg by mouth every morning. [provider] Taking Active Self, Pharmacy Records  ibuprofen (ADVIL) 400 MG tablet [id number] No Take 1 tablet (400 mg total) by mouth every 6 (six) hours as needed. Marcelino Duster, MD Taking Active   icosapent Ethyl (VASCEPA) 1 g capsule [id number] No Take 2 capsules (2 g total) by mouth 2 (two) times daily. Debbe Odea, MD Taking Active Self, Pharmacy Records           Med Note [place], [name]   Tue Mar 19, 2022 10:24 AM) Taking 1 capsule twice daily per Dr. Patrecia Pace  Insulin Degludec (TRESIBA) 100 UNIT/ML SOLN [id number] No Inject 30 Units into the skin.  Patient not taking: Reported on 03/06/2023   [provider] Not Taking Active Self, Pharmacy Records  Insulin Glargine [facility]) 100 UNIT/ML [id number] No Inject 40 Units into the skin at bedtime. [provider] Taking Active Self, Pharmacy Records  lisinopril (ZESTRIL) 20 MG tablet [id number] No TAKE ONE TABLET BY MOUTH ONCE DAILY Alison Salines, FNP Taking Active Self, Pharmacy Records  lurasidone (LATUDA) 20 MG TABS tablet [id number] No Take 1 tablet (20 mg total) by mouth daily with supper. [name], MD Taking Active   magnesium oxide (MAG-OX) 400 MG tablet [id number] No Take by mouth. [provider] Taking Active Self, Pharmacy Records  Melatonin 10 MG TABS [id number] No Take 10 mg by mouth at bedtime. [provider] Taking Active Self, Pharmacy Records  meloxicam (MOBIC) 15 MG tablet [id number] No Take 1 tablet (15 mg total) by mouth daily.  Patient not taking: Reported on 03/06/2023   Racheal Patches, PA-C Not Taking Active Self,  Pharmacy Records  metFORMIN (GLUCOPHAGE) 1000 MG tablet [id number] No Take 1 tablet (1,000 mg total) by mouth 2 (two) times daily with a meal. Alison Salines, FNP Taking Active Self, Pharmacy Records  methocarbamol (ROBAXIN) 500 MG tablet [id number] No Take 1 tablet (500 mg total) by mouth 4 (four) times daily.  Patient not taking: Reported on 03/06/2023   Racheal Patches, PA-C Not Taking Active Self, Pharmacy Records  mirabegron ER (MYRBETRIQ) 50 MG TB24 tablet [id number] No Take 1 tablet (50 mg total) by mouth daily. Alison Salines, FNP Taking Active Self, Pharmacy Records  mirtazapine (REMERON) 15 MG tablet [id number] No Take 1 tablet (15 mg total) by mouth at bedtime. TAKE ONE TABLET BY MOUTH EVERYDAY AT BEDTIME [name], MD Taking Active   naloxone [facility]) nasal spray 4 mg/0.1 mL [id number] No Place 0.4 mg into the nose once. [provider] Taking Active Self, Pharmacy Records  NOVOLOG FLEXPEN 100 UNIT/ML FlexPen [id number] No Inject 0-30 Units into the skin 3 (three) times daily with meals. Sliding scale [provider] Taking Active Self, Pharmacy Records  PRECISION QID TEST test strip [id number] No   Patient not taking: Reported on 03/19/2023   [provider] Not Taking Active Self, Pharmacy Records  pregabalin (LYRICA) 200 MG capsule [id number] No Take 1 capsule (200 mg total) by mouth 2 (two) times daily. Phineas Semen, MD Taking Active Self, Pharmacy Records  rosuvastatin (CRESTOR) 10  MG tablet [id number] No Take 1 tablet (10 mg total) by mouth daily. Debbe Odea, MD Taking Active Self, Pharmacy Records  tacrolimus (PROGRAF) 1 MG capsule [id number] No Take 1 capsule by mouth every morning. [provider] Taking Active Self, Pharmacy Records  UBRELVY 50 MG TABS [id number] No Take 1 tablet by mouth daily as needed. [provider] Taking Active Self, Pharmacy Records  venlafaxine XR (EFFEXOR XR) 37.5 MG 24 hr capsule [id number] No  Take 1 capsule (37.5 mg total) by mouth daily with breakfast. Take along with 75 mg daily [name], Levin Bacon, MD Taking Active   venlafaxine XR (EFFEXOR XR) 75 MG 24 hr capsule [id number] No Take 1 capsule (75 mg total) by mouth daily with breakfast. [name], MD Taking Active   XTAMPZA ER 9 MG C12A [id number] No Take 1 capsule by mouth every 12 (twelve) hours. [provider] Taking Active Self, Pharmacy Records           Patient Active Problem List   Diagnosis Date Noted   Acute idiopathic pericarditis 03/09/2023   Acute pericarditis 03/08/2023   Sepsis (HCC) 03/06/2023   Chest pain 03/06/2023   CKD (chronic kidney disease), stage II 01/22/2023   Insulin dependent type 2 diabetes mellitus (HCC) 01/22/2023   Overactive bladder 01/22/2023   High risk medication use 12/12/2022   Sjogren's syndrome (HCC) 05/27/2022   Menorrhagia with irregular cycle    Akathisia 10/24/2021   At risk for prolonged QT interval syndrome 09/12/2021   Prolonged Q-T interval on ECG 08/29/2021   Insomnia 07/20/2021   Bipolar disorder, in full remission, most recent episode mixed (HCC) 07/20/2021   GAD (generalized anxiety disorder) 05/09/2021   Opioid dependence with opioid-induced disorder (HCC) 12/20/2020   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   Carpal tunnel syndrome, left 07/26/2019   Cubital tunnel syndrome on left 07/26/2019   Umbilical hernia without obstruction and without gangrene 06/17/2019   Lumbar spondylosis 06/02/2019   Obesity (BMI 35.0-39.9 without comorbidity) 06/02/2019   Goals of care, counseling/discussion 05/18/2019   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   Fibromyalgia 03/23/2019   Systemic lupus erythematosus (SLE) in adult [facility]) 03/23/2019   Essential hypertension 03/23/2019   Mass of left kidney 03/23/2019   Nausea without vomiting 03/23/2019   Hyperlipidemia 03/23/2019   Osteoarthritis 03/23/2019   Trigeminal neuralgia 03/23/2019    Nephrolithiasis 03/23/2019   Migraine 03/23/2019   OSA on CPAP 03/23/2019   Fibrocystic breast 03/23/2019   Heart murmur 03/23/2019   Major depressive disorder, recurrent (HCC) 03/18/2017   Morbid obesity (HCC) 03/18/2017   Neuropathic pain 11/19/2016   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HCC) 01/13/2015   Lumbar disc disease with radiculopathy 07/26/2013   S/P liver transplant (HCC) 07/26/2013   Facial nerve disorder 06/29/2012   Low back pain 12/26/2010   Conditions to be addressed/monitored per PCP order:  Chronic healthcare management needs, anxiety/depression/BP/PTSD, OSD, migraines, DM, HTN, chronic pain, SLE, LDD, HLD, fibromyalgia, osteoarthritis, LBP, DLBCL, liver transplant, lymphoma, CKD-3  Care Plan : Chronic Pain (Adult)  Updates made by [name], RN since 03/25/2023 12:00 AM     Problem: Chronic Pain Management-fibromyalgia   Priority: High  Onset Date: 01/22/2021     Long-Range Goal: Fibromyalgia pain managed-new pain management provider   Start Date: 08/14/2020  Expected End Date: 06/25/2023  Recent Progress: On track  Priority: High  Note:   Current Barriers:  Knowledge deficits related to pain management  03/25/23:  patient with no issues today  Nurse Case Manager Clinical Goal(s):  Over the next 45 days, patient will work with [facility] to address needs related to referral for pain management provider and associated care coordination needs. 05/24/22:  patient is currently seeing Dr. Welton Flakes at [facility].  Interventions:  Inter-disciplinary care team collaboration (see longitudinal plan of care) Evaluation of current treatment plan related to fibromyalgia  and patient's adherence to plan as established by provider. Update 06/15/21:  Patient taking Baclofen again, Effexor added. Collaborated with primary care provider regarding recommendations and referral to pain management provider. Discussed plans with patient for ongoing care  management follow up and provided patient with direct contact information for care management team Anticipate pain education program, pain management support as part of pain management referral.       Patient Goals/Self-Care Activities Over the next 45 days, patient will:  -Attends all scheduled provider appointments  develop a personal pain management plan with your pain management provider.  Follow Up Plan:  The Managed Medicaid care management team will reach out to the patient again over the next 45 business days.   Care Plan : Wellness (Adult)  Updates made by [name], RN since 03/25/2023 12:00 AM     Problem: Medication Adherence (Wellness)   Priority: High  Onset Date: 01/22/2021     Long-Range Goal: Medication Adherence Maintained   Start Date: 09/06/2020  Expected End Date: 06/25/2023  Recent Progress: On track  Priority: High  Note:   Current Barriers:  Chronic Disease Management of chronic health conditions, OSD, migraines, DM, HTN, chronic pain, anxiety/depression/ Bipolar/PTSD, SLE, LDD, HLD, fibromyalgia, osteoarthritis, LBP, DLBCL, liver transplant, lymphoma 03/25/23:  Patient hospitalized 5/30-6/4 for sepsis.  Recent appts with PCP, CARDS, LIVER-needs ENDO, Nephrology, and DERM.   Has upcoming appt with Nutrition.  Is taking Abx for "bladder infection" and feeling better.  Blood sugars 120-150   Nurse Case Manager Clinical Goal(s):  Over the next 30 days, patient will work with CM team pharmacist to review current medications. Update 01/22/21:  Patient met with Pharmacist and continues to follow. Over the next 30 days, patient will meet with Nutritionist and check her blood sugars. Over the next 30 days, patient will attend all scheduled appointments.  Interventions:  Inter-disciplinary care team collaboration (see longitudinal plan of care) Evaluation of current treatment plan and patient's adherence to plan as established by provider. Advised patient to  contact her PCP for any medication needs. Reviewed medications with patient. Collaborated with pharmacy regarding medications.  Discussed plans with patient for ongoing care management follow up and provided patient with direct contact information for care management team Pharmacy referral for medication review. Patient given phone number for [facility] transportation if needed. Will notify PCP of need for testing strips and dietician referral. Collaborated with PCP for cardiology appointment. Collaborated with [name] for dental resources. [name] referral for dental resources-completed.  Hyperlipidemia Interventions:  (Status:  New goal.) Long Term Goal Medication review performed; medication list updated in electronic medical record.  Provider established cholesterol goals reviewed Counseled on importance of regular laboratory monitoring as prescribed Reviewed importance of limiting foods high in cholesterol Reviewed exercise goals and target of 150 minutes per week Screening for signs and symptoms of depression related to chronic disease state Assessed social determinant of health barriers    Diabetes Interventions:  (Status:  New goal.) Long Term Goal Assessed patient's understanding of A1c goal: <7% Reviewed medications with patient and discussed importance of medication adherence Counseled  on importance of regular laboratory monitoring as prescribed Discussed plans with patient for ongoing care management follow up and provided patient with direct contact information for care management team Reviewed scheduled/upcoming provider appointments Advised patient, providing education and rationale, to check cbg as directed and record, calling provider for findings outside established parameters Review of patient status, including review of consultants reports, relevant laboratory and other test results, and medications completed Assessed social determinant of health  barriers Lab Results  Component Value Date   HGBA1C 6.4 03/06/2022             HGBA1C                                                      8.8                                    03/19/223  Hyperlipidemia Interventions:  (Status:  New goal.) Long Term Goal Medication review performed; medication list updated in electronic medical record.  Provider established cholesterol goals reviewed Reviewed importance of limiting foods high in cholesterol Assessed social determinant of health barriers   Hypertension Interventions:  (Status:  New goal.) Long Term Goal Last practice recorded BP readings:  BP Readings from Last 3 Encounters:  10/21/22 (!) 142/106  09/18/22 113/85  06/21/22 104/62  12/03/22         130/81 12/12/22        104/73 01/22/23        116/74 03/19/23        124/72  Most recent eGFR/CrCl:  Lab Results  Component Value Date   EGFR 29 (L) 05/27/2022    No components found for: "CRCL"  Evaluation of current treatment plan related to hypertension self management and patient's adherence to plan as established by provider Reviewed medications with patient and discussed importance of compliance Discussed plans with patient for ongoing care management follow up and provided patient with direct contact information for care management team Advised patient, providing education and rationale, to monitor blood pressure daily and record, calling PCP for findings outside established parameters Reviewed scheduled/upcoming provider appointments including:  Provided education on prescribed diet Discussed complications of poorly controlled blood pressure such as heart disease, stroke, circulatory complications, vision complications, kidney impairment, sexual dysfunction Assessed social determinant of health barriers  Pain Interventions:  (Status:  New goal.) Long Term Goal Pain assessment performed Medications reviewed Reviewed provider established plan for pain management Discussed  importance of adherence to all scheduled medical appointments Counseled on the importance of reporting any/all new or changed pain symptoms or management strategies to pain management provider Advised patient to report to care team affect of pain on daily activities Discussed use of relaxation techniques and/or diversional activities to assist with pain reduction (distraction, imagery, relaxation, massage, acupressure, TENS, heat, and cold application Reviewed with patient prescribed pharmacological and nonpharmacological pain relief strategies Assessed social determinant of health barriers   Patient Goals/Self-Care Activities Over the next 30 days, patient will:  -Patient will take medications as prescribed. Calls pharmacy for medication refills Calls provider office for new concerns or questions Patient to schedule an appt with DERM/ Nephrology, ENDO  Follow Up Plan: The Managed Medicaid care management team will reach out to the patient again over  the next 45 business days.  The patient has been provided with contact information for the Managed Medicaid care management team and has been advised to call with any health related questions or concerns.    Follow Up:  Patient agrees to Care Plan and Follow-up.  Plan: The Managed Medicaid care management team will reach out to the patient again over the next 30 business  days. and The  Patient has been provided with contact information for the Managed Medicaid care management team and has been advised to call with any health related questions or concerns.  Date/time of next scheduled RN care management/care coordination outreach: 04/29/23 at 1230

## 2023-03-25 NOTE — Patient Instructions (Signed)
Hi Ms. Razzano, I am glad you are feeling better!  Have a nice afternoon!  Ms. Schuldt was given information about Medicaid Managed Care team care coordination services as a part of their Healthy [facility] Medicaid benefit. [name] verbally consented to engagement with the [facility] Managed Care team.   If you are experiencing a medical emergency, please call 911 or report to your local emergency department or urgent care.   If you have a non-emergency medical problem during routine business hours, please contact your provider's office and ask to speak with a nurse.   For questions related to your Healthy [facility] health plan, please call: [phone] or visit the homepage here: [email or web address]  If you would like to schedule transportation through your Healthy [facility] plan, please call the following number at least 2 days in advance of your appointment: [phone]  For information about your ride after you set it up, call Ride Assist at [phone]. Use this number to activate a Will Call pickup, or if your transportation is late for a scheduled pickup. Use this number, too, if you need to make a change or cancel a previously scheduled reservation.  If you need transportation services right away, call [phone]. The after-hours call center is staffed 24 hours to handle ride assistance and urgent reservation requests (including discharges) 365 days a year. Urgent trips include sick visits, hospital discharge requests and life-sustaining treatment.  Call the [facility] Line at [phone], at any time, 24 hours a day, 7 days a week. If you are in danger or need immediate medical attention call 911.  If you would like help to quit smoking, call 1-800-QUIT-NOW ((330) 282-0222) OR Espaol: 1-855-Djelo-Ya (5-188-416-6063) o para ms informacin haga clic aqu or Text READY to 016-010 to register via text  Ms. Tortorice -  following are the goals we discussed in your visit today:   Goals Addressed             This Visit's Progress    Chronic Pain Managed       03/25/23:  No voiced complaints today     Get sugars at goal       Timeframe:  Short-Term Goal Priority:  High Start Date:                             Expected End Date:      ongoing                 Follow Up Date: 04/29/23   - call for medicine refill 2 or 3 days before it runs out - call if I am sick and can't take my medicine - keep a list of all the medicines I take; vitamins and herbals too    Why is this important?   These steps will help you keep on track with your medicines. 03/25/23:  Blood sugars 120-150   Patient verbalizes understanding of instructions and care plan provided today and agrees to view in MyChart. Active MyChart status and patient understanding of how to access instructions and care plan via MyChart confirmed with patient.     The Managed Medicaid care management team will reach out to the patient again over the next 30 business  days.  The  Patient has been provided with contact information for the Managed Medicaid care management team and has been advised to call with any health related questions or concerns.   Aspen Lawrance Administrator, arts,  [name] [place]  [facility] Care Management Coordinator - Managed [place] High Risk [phone]   a copy of your plan of care:  Care Plan : Chronic Pain (Adult)  Updates made by [name], RN since 03/25/2023 12:00 AM     Problem: Chronic Pain Management-fibromyalgia   Priority: High  Onset Date: 01/22/2021     Long-Range Goal: Fibromyalgia pain managed-new pain management provider   Start Date: 08/14/2020  Expected End Date: 06/25/2023  Recent Progress: On track  Priority: High  Note:   Current Barriers:  Knowledge deficits related to pain management  03/25/23:  patient with no issues today  Nurse Case Manager Clinical Goal(s):  Over the next 45 days,  patient will work with [facility] to address needs related to referral for pain management provider and associated care coordination needs. 05/24/22:  patient is currently seeing Dr. Welton Flakes at [facility].  Interventions:  Inter-disciplinary care team collaboration (see longitudinal plan of care) Evaluation of current treatment plan related to fibromyalgia  and patient's adherence to plan as established by provider. Update 06/15/21:  Patient taking Baclofen again, Effexor added. Collaborated with primary care provider regarding recommendations and referral to pain management provider. Discussed plans with patient for ongoing care management follow up and provided patient with direct contact information for care management team Anticipate pain education program, pain management support as part of pain management referral.       Patient Goals/Self-Care Activities Over the next 45 days, patient will:  -Attends all scheduled provider appointments  develop a personal pain management plan with your pain management provider.  Follow Up Plan:  The Managed Medicaid care management team will reach out to the patient again over the next 45 business days.   Care Plan : Wellness (Adult)  Updates made by [name], RN since 03/25/2023 12:00 AM     Problem: Medication Adherence (Wellness)   Priority: High  Onset Date: 01/22/2021     Long-Range Goal: Medication Adherence Maintained   Start Date: 09/06/2020  Expected End Date: 06/25/2023  Recent Progress: On track  Priority: High  Note:   Current Barriers:  Chronic Disease Management of chronic health conditions, OSD, migraines, DM, HTN, chronic pain, anxiety/depression/ Bipolar/PTSD, SLE, LDD, HLD, fibromyalgia, osteoarthritis, LBP, DLBCL, liver transplant, lymphoma 03/25/23:  Patient hospitalized 5/30-6/4 for sepsis.  Recent appts with PCP, CARDS, LIVER-needs ENDO, Nephrology, and DERM.   Has upcoming appt with Nutrition.  Is taking  Abx for "bladder infection" and feeling better.  Blood sugars 120-150   Nurse Case Manager Clinical Goal(s):  Over the next 30 days, patient will work with CM team pharmacist to review current medications. Update 01/22/21:  Patient met with Pharmacist and continues to follow. Over the next 30 days, patient will meet with Nutritionist and check her blood sugars. Over the next 30 days, patient will attend all scheduled appointments.  Interventions:  Inter-disciplinary care team collaboration (see longitudinal plan of care) Evaluation of current treatment plan and patient's adherence to plan as established by provider. Advised patient to contact her PCP for any medication needs. Reviewed medications with patient. Collaborated with pharmacy regarding medications.  Discussed plans with patient for ongoing care management follow up and provided patient with direct contact information for care management team Pharmacy referral for medication review. Patient given phone number for Healthy [facility] transportation if needed. Will notify PCP of need for testing strips and dietician referral. Collaborated with PCP for cardiology appointment. Collaborated with  [name] for dental resources. [name] referral for dental resources-completed.  Hyperlipidemia Interventions:  (Status:  New goal.) Long Term Goal Medication review performed; medication list updated in electronic medical record.  Provider established cholesterol goals reviewed Counseled on importance of regular laboratory monitoring as prescribed Reviewed importance of limiting foods high in cholesterol Reviewed exercise goals and target of 150 minutes per week Screening for signs and symptoms of depression related to chronic disease state Assessed social determinant of health barriers    Diabetes Interventions:  (Status:  New goal.) Long Term Goal Assessed patient's understanding of A1c goal: <7% Reviewed medications with patient and  discussed importance of medication adherence Counseled on importance of regular laboratory monitoring as prescribed Discussed plans with patient for ongoing care management follow up and provided patient with direct contact information for care management team Reviewed scheduled/upcoming provider appointments Advised patient, providing education and rationale, to check cbg as directed and record, calling provider for findings outside established parameters Review of patient status, including review of consultants reports, relevant laboratory and other test results, and medications completed Assessed social determinant of health barriers Lab Results  Component Value Date   HGBA1C 6.4 03/06/2022             HGBA1C                                                      8.8                                    03/19/223  Hyperlipidemia Interventions:  (Status:  New goal.) Long Term Goal Medication review performed; medication list updated in electronic medical record.  Provider established cholesterol goals reviewed Reviewed importance of limiting foods high in cholesterol Assessed social determinant of health barriers   Hypertension Interventions:  (Status:  New goal.) Long Term Goal Last practice recorded BP readings:  BP Readings from Last 3 Encounters:  10/21/22 (!) 142/106  09/18/22 113/85  06/21/22 104/62  12/03/22         130/81 12/12/22        104/73 01/22/23        116/74 03/19/23        124/72  Most recent eGFR/CrCl:  Lab Results  Component Value Date   EGFR 29 (L) 05/27/2022    No components found for: "CRCL"  Evaluation of current treatment plan related to hypertension self management and patient's adherence to plan as established by provider Reviewed medications with patient and discussed importance of compliance Discussed plans with patient for ongoing care management follow up and provided patient with direct contact information for care management team Advised patient,  providing education and rationale, to monitor blood pressure daily and record, calling PCP for findings outside established parameters Reviewed scheduled/upcoming provider appointments including:  Provided education on prescribed diet Discussed complications of poorly controlled blood pressure such as heart disease, stroke, circulatory complications, vision complications, kidney impairment, sexual dysfunction Assessed social determinant of health barriers  Pain Interventions:  (Status:  New goal.) Long Term Goal Pain assessment performed Medications reviewed Reviewed provider established plan for pain management Discussed importance of adherence to all scheduled medical appointments Counseled on the importance of reporting any/all new or changed pain symptoms or management strategies to pain management  provider Advised patient to report to care team affect of pain on daily activities Discussed use of relaxation techniques and/or diversional activities to assist with pain reduction (distraction, imagery, relaxation, massage, acupressure, TENS, heat, and cold application Reviewed with patient prescribed pharmacological and nonpharmacological pain relief strategies Assessed social determinant of health barriers   Patient Goals/Self-Care Activities Over the next 30 days, patient will:  -Patient will take medications as prescribed. Calls pharmacy for medication refills Calls provider office for new concerns or questions Patient to schedule an appt with DERM/ Nephrology, ENDO  Follow Up Plan: The Managed Medicaid care management team will reach out to the patient again over the next 45 business days.  The patient has been provided with contact information for the Managed Medicaid care management team and has been advised to call with any health related questions or concerns.

## 2023-03-25 NOTE — Progress Notes (Unsigned)
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 She presents today for hospital follow up regarding her pericardial effusion noted on CT and pericarditis.   Past Medical History    Past Medical History:  Diagnosis Date   Abnormal uterine bleeding    Allergy    Anxiety    Arthritis    Bipolar disorder (manic depression) (HCC)    Chronic kidney failure    Chronic renal disease, stage III (HCC)    Diabetes mellitus without complication (HCC)    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HCC) 2015   Right axillary lymph node resected and chemo tx's.   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   Chemo tx's   Migraine    Morbid obesity (HCC)    Neuromuscular disorder (HCC)    neuropathy   Neuropathy    Personality disorder (HCC)    Pineal gland cyst    Post herpetic neuralgia    PTSD (post-traumatic stress disorder)    Renal disorder    S/P liver transplant (HCC) 1993   Sleep apnea    has not recieved cpap yet-other one was recalled   Past Surgical History:  Procedure Laterality Date   BONE MARROW BIOPSY  01/13/2015   BREAST BIOPSY   12/2014   BREAST BIOPSY  2011   BREAST SURGERY     CHOLECYSTECTOMY     COLONOSCOPY     COLONOSCOPY WITH PROPOFOL N/A 02/28/2022   Procedure: COLONOSCOPY WITH PROPOFOL;  Surgeon: Midge Minium, MD;  Location: [facility] ENDOSCOPY;  Service: Endoscopy;  Laterality: N/A;   ESOPHAGOGASTRODUODENOSCOPY     ESOPHAGOGASTRODUODENOSCOPY (EGD) WITH PROPOFOL N/A 01/09/2021   Procedure: ESOPHAGOGASTRODUODENOSCOPY (EGD) WITH PROPOFOL;  Surgeon: Midge Minium, MD;  Location: [facility] ENDOSCOPY;  Service: Endoscopy;  Laterality: N/A;   HERNIA REPAIR     x4-all from liver transplant   HYSTEROSCOPY WITH D & C N/A 11/13/2021   Procedure: DILATATION AND CURETTAGE /HYSTEROSCOPY;  Surgeon: Natale Milch, MD;  Location: [facility] ORS;  Service: Gynecology;  Laterality: N/A;   infusaport     LIVER TRANSPLANT  12/17/1991   LUMBAR PUNCTURE     PORT A CATH INJECTION ([facility] HX)     RENAL BIOPSY     tumor removal  2015   cancer right side   Allergies  Allergies  Allergen Reactions   Penicillin G Itching, Rash, Anaphylaxis and Palpitations   Lamictal [Lamotrigine] Rash   Carbamazepine Other (See Comments)    Medication interaction-prograf    Hydrocodone-Acetaminophen Itching   Naproxen Itching   Penicillins    Vancomycin Rash    Macular/blotchy rash at  infusion site   Labs/Other Studies Reviewed    The following studies were reviewed today: Cardiac Studies & Procedures     STRESS TESTS  NM MYOCAR MULTI W/SPECT W 09/15/2021  Narrative Pharmacological myocardial perfusion imaging study with no significant  ischemia Normal wall motion, EF estimated at 66% No EKG changes concerning for ischemia at peak stress or in recovery. CT attenuation correction images with no significant coronary calcification or aortic atherosclerosis Low risk scan   Signed, Dossie Arbour, MD, Ph.D [facility]   ECHOCARDIOGRAM  ECHOCARDIOGRAM COMPLETE 03/07/2023  Narrative ECHOCARDIOGRAM REPORT    Patient Name:   Alison [name] Date of Exam: 03/07/2023 Medical Rec #:  [id number]         Height:       66.0 in Accession #:    [id number]        Weight:       250.0 lb Date of Birth:  08/08/1976         BSA:          2.199 m Patient Age:    47 years          BP:           145/84 mmHg Patient Gender: F                 HR:           99 bpm. Exam Location:  [facility]  Procedure: 2D Echo, Cardiac Doppler and Color Doppler  Indications:     Chest Pain  History:         Patient has prior history of Echocardiogram examinations, most recent 10/17/2021. Signs/Symptoms:Chest Pain and Murmur; Risk Factors:Hypertension, Sleep Apnea, Diabetes and Dyslipidemia. CKD, s/p Liver transplant.  Sonographer:     Mikki Harbor Referring Phys:  [id number] AMY N COX Diagnosing Phys: Lorine Bears MD   Sonographer Comments: Patient is obese. IMPRESSIONS   1. Left ventricular ejection fraction, by estimation, is 55 to 60%. The left ventricle has normal function. The left ventricle has no regional wall motion abnormalities. Left ventricular diastolic parameters were normal. 2. Right ventricular systolic function is normal. The right ventricular size is normal. There is normal pulmonary artery systolic pressure. 3. Left atrial size was mildly dilated. 4. The mitral valve is normal in structure. No evidence of mitral valve regurgitation. No evidence of mitral stenosis. 5. The aortic valve is normal in structure. Aortic valve regurgitation is not visualized. No aortic stenosis is present.  FINDINGS Left Ventricle: Left ventricular ejection fraction, by estimation, is 55 to 60%. The left ventricle has normal function. The left ventricle has no regional wall motion abnormalities. The left ventricular internal cavity size was normal in size. There is borderline left ventricular hypertrophy. Left ventricular diastolic parameters were normal.  Right Ventricle: The right ventricular size is normal. No increase in right ventricular wall  thickness. Right ventricular systolic function is normal. There is normal pulmonary artery systolic pressure. The tricuspid regurgitant velocity is 2.57 m/s, and with an assumed right atrial pressure of 5 mmHg, the estimated right ventricular systolic pressure is 31.4 mmHg.  Left Atrium: Left atrial size was mildly dilated.  Right Atrium: Right atrial size was normal in size.  Pericardium: There is no evidence of pericardial effusion.  Mitral Valve: The mitral valve is normal in structure. No evidence of mitral valve regurgitation. No evidence of mitral valve stenosis. MV peak gradient, 4.7 mmHg. The mean mitral valve gradient is 2.0 mmHg.  Tricuspid Valve:  The tricuspid valve is normal in structure. Tricuspid valve regurgitation is mild . No evidence of tricuspid stenosis.  Aortic Valve: The aortic valve is normal in structure. Aortic valve regurgitation is not visualized. No aortic stenosis is present. Aortic valve mean gradient measures 7.0 mmHg. Aortic valve peak gradient measures 12.0 mmHg. Aortic valve area, by VTI measures 2.04 cm.  Pulmonic Valve: The pulmonic valve was normal in structure. Pulmonic valve regurgitation is not visualized. No evidence of pulmonic stenosis.  Aorta: The aortic root is normal in size and structure.  Venous: The inferior vena cava was not well visualized.  IAS/Shunts: No atrial level shunt detected by color flow Doppler.   LEFT VENTRICLE PLAX 2D LVIDd:         3.90 cm   Diastology LVIDs:         2.50 cm   LV e' medial:    8.70 cm/s LV PW:         1.20 cm   LV E/e' medial:  10.1 LV IVS:        1.10 cm   LV e' lateral:   11.60 cm/s LVOT diam:     2.00 cm   LV E/e' lateral: 7.5 LV SV:         69 LV SV Index:   31 LVOT Area:     3.14 cm   RIGHT VENTRICLE RV Basal diam:  3.35 cm RV Mid diam:    3.80 cm RV S prime:     10.70 cm/s TAPSE (M-mode): 2.4 cm  LEFT ATRIUM             Index        RIGHT ATRIUM           Index LA diam:        3.90 cm  1.77 cm/m   RA Area:     18.80 cm LA Vol (A2C):   45.2 ml 20.56 ml/m  RA Volume:   50.30 ml  22.88 ml/m LA Vol (A4C):   38.7 ml 17.60 ml/m LA Biplane Vol: 41.0 ml 18.65 ml/m AORTIC VALVE                     PULMONIC VALVE AV Area (Vmax):    2.23 cm      PV Vmax:       1.23 m/s AV Area (Vmean):   2.08 cm      PV Peak grad:  6.1 mmHg AV Area (VTI):     2.04 cm AV Vmax:           173.00 cm/s AV Vmean:          128.000 cm/s AV VTI:            0.339 m AV Peak Grad:      12.0 mmHg AV Mean Grad:      7.0 mmHg LVOT Vmax:         123.00 cm/s LVOT Vmean:        84.800 cm/s LVOT VTI:          0.220 m LVOT/AV VTI ratio: 0.65  AORTA Ao Root diam: 2.70 cm  MITRAL VALVE               TRICUSPID VALVE MV Area (PHT): 4.08 cm    TR Peak grad:   26.4 mmHg MV Area VTI:   3.47 cm    TR Vmax:        257.00 cm/s MV Peak grad:  4.7 mmHg MV Mean grad:  2.0 mmHg    SHUNTS MV Vmax:       1.08 m/s    Systemic VTI:  0.22 m MV Vmean:      61.7 cm/s   Systemic Diam: 2.00 cm MV Decel Time: 186 msec MV E velocity: 87.50 cm/s MV A velocity: 55.90 cm/s MV E/A ratio:  1.57  Lorine Bears MD Electronically signed by Lorine Bears MD Signature Date/Time: 03/07/2023/3:17:23 PM    Final            Recent Labs: 03/06/2023: ALT 25 03/09/2023: BUN 16; Hemoglobin 11.6; Platelets 141; Potassium 4.1; Sodium 136; TSH 4.878 03/10/2023: Creatinine, Ser 0.98; Magnesium 2.0  Recent Lipid Panel    Component Value Date/Time   CHOL 66 03/08/2023 0700   CHOL 173 12/03/2021 0804   TRIG 87 03/08/2023 0700   HDL 30 (L) 03/08/2023 0700   HDL 38 (L) 12/03/2021 0804   CHOLHDL 2.2 03/08/2023 0700   VLDL 17 03/08/2023 0700   LDLCALC 19 03/08/2023 0700   LDLCALC 19 05/27/2022 1133    History of Present Illness    47 year old female with past medical history of pericardial effusion, obesity, 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   Ms. Scullin presented on 03/06/23 with pleuritic chest pain, found to be septic, no source of infection identified. Her cardiac workup indicated normal troponin's, normal LV function without regional wall motion abnormalities on echo, although she was noted to have a small pericardial effusion on CT scan. While inpatient she was started on colchicine and ibuprofen, with noted improvement in her chest pain. She was discharged on 03/11/23 following a course of IV broad spectrum antibiotics.   Today she reports that she is improving overall. Notes she some chest pain remains with specific stretching movements, improves with ibuprofen use. She is no longer having pain when laying down or with deep breathing. Reports palpitations a few times a week that last for less than thirty minutes, associated with shortness of breath. Notes these palpitations started before her hospitalization. She denies dizziness, lightheadedness or edema.   Home Medications    Current Outpatient Medications  Medication Sig Dispense Refill   baclofen (LIORESAL) 10 MG tablet Take 1 tablet by mouth 2 (two) times daily.     benztropine (COGENTIN) 1 MG tablet Take 1 tablet (1 mg total) by mouth daily as needed for tremors. 90 tablet 0   Cholecalciferol (VITAMIN D) 125 MCG (5000 UT) CAPS Take 5,000 Units by mouth daily.     ezetimibe (ZETIA) 10 MG tablet Take 10 mg by mouth every morning.     ibuprofen (ADVIL) 400 MG tablet Take 1 tablet (400 mg total) by mouth every 6 (six) hours as needed. 30 tablet 0   icosapent Ethyl (VASCEPA) 1 g capsule Take 2 capsules (2 g total) by mouth 2 (two) times daily. 120 capsule 5   Insulin Degludec (TRESIBA) 100 UNIT/ML SOLN Inject 30 Units into the skin.     Insulin Glargine  (BASAGLAR KWIKPEN) 100 UNIT/ML Inject 40 Units into the skin at bedtime.     lisinopril (ZESTRIL) 20 MG tablet TAKE ONE TABLET BY MOUTH ONCE DAILY 90 tablet 3   lurasidone (LATUDA) 20 MG TABS tablet Take 1  tablet (20 mg total) by mouth daily with supper. 90 tablet 0   magnesium oxide (MAG-OX) 400 MG tablet Take by mouth.     Melatonin 10 MG TABS Take 10 mg by mouth at bedtime.     meloxicam (MOBIC) 15 MG tablet Take 1 tablet (15 mg total) by mouth daily. 30 tablet 0   metFORMIN (GLUCOPHAGE) 1000 MG tablet Take 1 tablet (1,000 mg total) by mouth 2 (two) times daily with a meal. 180 tablet 3   methocarbamol (ROBAXIN) 500 MG tablet Take 1 tablet (500 mg total) by mouth 4 (four) times daily. 16 tablet 0   mirabegron ER (MYRBETRIQ) 50 MG TB24 tablet Take 1 tablet (50 mg total) by mouth daily. 90 tablet 3   mirtazapine (REMERON) 15 MG tablet Take 1 tablet (15 mg total) by mouth at bedtime. TAKE ONE TABLET BY MOUTH EVERYDAY AT BEDTIME 90 tablet 0   naloxone (NARCAN) nasal spray 4 mg/0.1 mL Place 0.4 mg into the nose once.     NOVOLOG FLEXPEN 100 UNIT/ML FlexPen Inject 0-30 Units into the skin 3 (three) times daily with meals. Sliding scale     pregabalin (LYRICA) 200 MG capsule Take 1 capsule (200 mg total) by mouth 2 (two) times daily. 60 capsule 0   rosuvastatin (CRESTOR) 10 MG tablet Take 1 tablet (10 mg total) by mouth daily. 90 tablet 2   tacrolimus (PROGRAF) 1 MG capsule Take 1 capsule by mouth every morning.     UBRELVY 50 MG TABS Take 1 tablet by mouth daily as needed.     venlafaxine XR (EFFEXOR XR) 37.5 MG 24 hr capsule Take 1 capsule (37.5 mg total) by mouth daily with breakfast. Take along with 75 mg daily 90 capsule 0   venlafaxine XR (EFFEXOR XR) 75 MG 24 hr capsule Take 1 capsule (75 mg total) by mouth daily with breakfast. 90 capsule 0   XTAMPZA ER 9 MG C12A Take 1 capsule by mouth every 12 (twelve) hours.     colchicine 0.6 MG tablet Take 1 tablet (0.6 mg total) by mouth 2 (two) times  daily. 120 tablet 0   dronabinol (MARINOL) 2.5 MG capsule Take 2.5 mg by mouth See admin instructions. Takes every other day (Patient not taking: Reported on 03/06/2023)     Dulaglutide 4.5 MG/0.5ML SOPN Inject 4.5 mg into the skin once a week. (Patient not taking: Reported on 03/26/2023)     PRECISION QID TEST test strip  (Patient not taking: Reported on 03/19/2023)     No current facility-administered medications for this visit.     Review of Systems  She denies pnd, orthopnea, n, v, dizziness, syncope, edema, weight gain, or early satiety. All other systems reviewed and are otherwise negative except as noted above.     Physical Exam    VS:  BP 110/80 (BP Location: Left Arm, Patient Position: Sitting, Cuff Size: Large)   Pulse 68   Ht 5\' 6"  (1.676 m)   Wt 252 lb 2 oz (114.4 kg)   SpO2 98%   BMI 40.69 kg/m  , BMI Body mass index is 40.69 kg/m.     GEN: Well nourished, well developed, in no acute distress. HEENT: normal. Neck: Supple, no JVD, carotid bruits, or masses. Cardiac: RRR, no murmurs, rubs, or gallops. No clubbing, cyanosis, edema.  Radials/DP/PT 2+ and equal bilaterally.  Respiratory:  Respirations regular and unlabored, clear to auscultation bilaterally. GI: Soft, nontender, nondistended, BS + x 4. MS: no deformity or atrophy. Skin:  warm and dry, no rash. Neuro:  Strength and sensation are intact. Psych: Normal affect.  Accessory Clinical Findings    ECG personally reviewed by me today - Normal sinus rhythm with sinus arrhythmia at 68bpm with right bundle branch block, Qtc 452- no acute changes    Lab Results  Component Value Date   WBC 8.3 03/09/2023   HGB 11.6 (L) 03/09/2023   HCT 34.7 (L) 03/09/2023   MCV 87.2 03/09/2023   PLT 141 (L) 03/09/2023   Lab Results  Component Value Date   CREATININE 0.98 03/10/2023   BUN 16 03/09/2023   NA 136 03/09/2023   K 4.1 03/09/2023   CL 104 03/09/2023   CO2 23 03/09/2023   Lab Results  Component Value Date   ALT  25 03/06/2023   AST 34 03/06/2023   ALKPHOS 67 03/06/2023   BILITOT 1.2 03/06/2023   Lab Results  Component Value Date   CHOL 66 03/08/2023   HDL 30 (L) 03/08/2023   LDLCALC 19 03/08/2023   TRIG 87 03/08/2023   CHOLHDL 2.2 03/08/2023    Lab Results  Component Value Date   HGBA1C 8.8 (H) 03/07/2023    Assessment & Plan    Pericarditis: Presented 03/06/23 with pleuritic chest pain, per CT small pericardial effusion was present. Today reports one/two out of ten pain with specific movements, no longer having pain when laying down or taking a deep breath. Has been taking ibuprofen as needed for pain with no adverse effects. Continue colchicine 0.6mg  daily.   Palpitations/shortness of breath: Reports palpitations multiple times a week that last less than thirty minutes, associated with shortness of breath, started before her hospitalization with pericarditis. Electrolytes day of discharge were normal. She takes magnesium 400mg  daily. Will further assess with ZIO XT 14 day monitor.    Hypertension: Blood pressure well controlled today at 110/80. While inpatient her lisinopril was decreased to 10mg  daily while inpaitnet for soft blood pressures. Continue lisinopril 10mg  daily.   Hyperlipidemia: Last lipid panel indicated total cholesterol of 66 and LDL of 19. Well controlled on rosuvastatin, Zetia and Vascepa.   Prolonged QT: Qtc appears stable at this time. EKG today indicated normal sinus rhythm with sinus arrythmia at 68bpm with right bundle branch block. Qtc 452.   Follow up in 4-6 weeks with Ward Givens, NP.     Rip Harbour, NP 03/26/2023, 5:22 PM

## 2023-03-26 ENCOUNTER — Ambulatory Visit: Payer: Medicaid Other | Attending: Nurse Practitioner | Admitting: Cardiology

## 2023-03-26 ENCOUNTER — Encounter: Payer: Self-pay | Admitting: Nurse Practitioner

## 2023-03-26 ENCOUNTER — Ambulatory Visit (INDEPENDENT_AMBULATORY_CARE_PROVIDER_SITE_OTHER): Payer: Medicaid Other

## 2023-03-26 VITALS — BP 110/80 | HR 68 | Ht 66.0 in | Wt 252.1 lb

## 2023-03-26 DIAGNOSIS — R072 Precordial pain: Secondary | ICD-10-CM

## 2023-03-26 DIAGNOSIS — I319 Disease of pericardium, unspecified: Secondary | ICD-10-CM

## 2023-03-26 DIAGNOSIS — R9431 Abnormal electrocardiogram [ECG] [EKG]: Secondary | ICD-10-CM

## 2023-03-26 DIAGNOSIS — R002 Palpitations: Secondary | ICD-10-CM

## 2023-03-26 MED ORDER — COLCHICINE 0.6 MG PO TABS: 0.6000 mg | ORAL_TABLET | Freq: Two times a day (BID) | ORAL | 0 refills | Status: DC

## 2023-03-26 NOTE — Patient Instructions (Signed)
Medication Instructions:  Your physician recommends that you continue on your current medications as directed. Please refer to the Current Medication list given to you today.  *If you need a refill on your cardiac medications before your next appointment, please call your pharmacy*   Lab Work: none If you have labs (blood work) drawn today and your tests are completely normal, you will receive your results only by: MyChart Message (if you have MyChart) OR A paper copy in the mail If you have any lab test that is abnormal or we need to change your treatment, we will call you to review the results.   Testing/Procedures: Your physician has recommended that you wear a Zio monitor 14 days.   This monitor is a medical device that records the heart's electrical activity. Doctors most often use these monitors to diagnose arrhythmias. Arrhythmias are problems with the speed or rhythm of the heartbeat. The monitor is a small device applied to your chest. You can wear one while you do your normal daily activities. While wearing this monitor if you have any symptoms to push the button and record what you felt. Once you have worn this monitor for the period of time provider prescribed (Usually 14 days), you will return the monitor device in the postage paid box. Once it is returned they will download the data collected and provide [place] with a report which the provider will then review and we will call you with those results. Important tips:  Avoid showering during the first 24 hours of wearing the monitor. Avoid excessive sweating to help maximize wear time. Do not submerge the device, no hot tubs, and no swimming pools. Keep any lotions or oils away from the patch. After 24 hours you may shower with the patch on. Take brief showers with your back facing the shower head.  Do not remove patch once it has been placed because that will interrupt data and decrease adhesive wear time. Push the button when you  have any symptoms and write down what you were feeling. Once you have completed wearing your monitor, remove and place into box which has postage paid and place in your outgoing mailbox.  If for some reason you have misplaced your box then call our office and we can provide another box and/or mail it off for you.      Follow-Up: At [facility], you and your health needs are our priority.  As part of our continuing mission to provide you with exceptional heart care, we have created designated Provider Care Teams.  These Care Teams include your primary Cardiologist (physician) and Advanced Practice Providers (APPs -  Physician Assistants and Nurse Practitioners) who all work together to provide you with the care you need, when you need it.  We recommend signing up for the patient portal called "MyChart".  Sign up information is provided on this After Visit Summary.  MyChart is used to connect with patients for Virtual Visits (Telemedicine).  Patients are able to view lab/test results, encounter notes, upcoming appointments, etc.  Non-urgent messages can be sent to your provider as well.   To learn more about what you can do with MyChart, go to [email or web address].    Your next appointment:   4-6 weeks  Provider:   Debbe Odea, MD or Nicolasa Ducking, NP

## 2023-03-28 DIAGNOSIS — F411 Generalized anxiety disorder: Secondary | ICD-10-CM | POA: Diagnosis not present

## 2023-03-28 DIAGNOSIS — Z944 Liver transplant status: Secondary | ICD-10-CM | POA: Diagnosis not present

## 2023-03-28 DIAGNOSIS — R52 Pain, unspecified: Secondary | ICD-10-CM | POA: Diagnosis not present

## 2023-03-28 DIAGNOSIS — F3131 Bipolar disorder, current episode depressed, mild: Secondary | ICD-10-CM | POA: Diagnosis not present

## 2023-03-28 DIAGNOSIS — E669 Obesity, unspecified: Secondary | ICD-10-CM | POA: Diagnosis not present

## 2023-03-28 DIAGNOSIS — I10 Essential (primary) hypertension: Secondary | ICD-10-CM | POA: Diagnosis not present

## 2023-03-28 DIAGNOSIS — K7581 Nonalcoholic steatohepatitis (NASH): Secondary | ICD-10-CM | POA: Diagnosis not present

## 2023-03-28 DIAGNOSIS — E7849 Other hyperlipidemia: Secondary | ICD-10-CM | POA: Diagnosis not present

## 2023-03-28 DIAGNOSIS — M797 Fibromyalgia: Secondary | ICD-10-CM | POA: Diagnosis not present

## 2023-03-28 DIAGNOSIS — Z6839 Body mass index (BMI) 39.0-39.9, adult: Secondary | ICD-10-CM | POA: Diagnosis not present

## 2023-03-28 DIAGNOSIS — E1165 Type 2 diabetes mellitus with hyperglycemia: Secondary | ICD-10-CM | POA: Diagnosis not present

## 2023-03-28 DIAGNOSIS — C8580 Other specified types of non-Hodgkin lymphoma, unspecified site: Secondary | ICD-10-CM | POA: Diagnosis not present

## 2023-03-30 DIAGNOSIS — R072 Precordial pain: Secondary | ICD-10-CM

## 2023-03-30 DIAGNOSIS — R002 Palpitations: Secondary | ICD-10-CM

## 2023-03-30 DIAGNOSIS — R9431 Abnormal electrocardiogram [ECG] [EKG]: Secondary | ICD-10-CM | POA: Diagnosis not present

## 2023-03-31 DIAGNOSIS — F3189 Other bipolar disorder: Secondary | ICD-10-CM | POA: Diagnosis not present

## 2023-03-31 DIAGNOSIS — M25519 Pain in unspecified shoulder: Secondary | ICD-10-CM | POA: Diagnosis not present

## 2023-03-31 DIAGNOSIS — M79606 Pain in leg, unspecified: Secondary | ICD-10-CM | POA: Diagnosis not present

## 2023-03-31 DIAGNOSIS — M5442 Lumbago with sciatica, left side: Secondary | ICD-10-CM | POA: Diagnosis not present

## 2023-03-31 DIAGNOSIS — I10 Essential (primary) hypertension: Secondary | ICD-10-CM | POA: Diagnosis not present

## 2023-03-31 DIAGNOSIS — F320 Major depressive disorder, single episode, mild: Secondary | ICD-10-CM | POA: Diagnosis not present

## 2023-03-31 DIAGNOSIS — G894 Chronic pain syndrome: Secondary | ICD-10-CM | POA: Diagnosis not present

## 2023-03-31 DIAGNOSIS — G893 Neoplasm related pain (acute) (chronic): Secondary | ICD-10-CM | POA: Diagnosis not present

## 2023-03-31 DIAGNOSIS — M5414 Radiculopathy, thoracic region: Secondary | ICD-10-CM | POA: Diagnosis not present

## 2023-03-31 DIAGNOSIS — M79669 Pain in unspecified lower leg: Secondary | ICD-10-CM | POA: Diagnosis not present

## 2023-03-31 DIAGNOSIS — M79603 Pain in arm, unspecified: Secondary | ICD-10-CM | POA: Diagnosis not present

## 2023-04-03 DIAGNOSIS — Z944 Liver transplant status: Secondary | ICD-10-CM | POA: Diagnosis not present

## 2023-04-04 DIAGNOSIS — F411 Generalized anxiety disorder: Secondary | ICD-10-CM | POA: Diagnosis not present

## 2023-04-04 DIAGNOSIS — F3181 Bipolar II disorder: Secondary | ICD-10-CM | POA: Diagnosis not present

## 2023-04-04 DIAGNOSIS — F4310 Post-traumatic stress disorder, unspecified: Secondary | ICD-10-CM | POA: Diagnosis not present

## 2023-04-11 DIAGNOSIS — Z5181 Encounter for therapeutic drug level monitoring: Secondary | ICD-10-CM | POA: Diagnosis not present

## 2023-04-11 DIAGNOSIS — Z944 Liver transplant status: Secondary | ICD-10-CM | POA: Diagnosis not present

## 2023-04-12 ENCOUNTER — Ambulatory Visit
Admission: EM | Admit: 2023-04-12 | Discharge: 2023-04-12 | Discharge status: Against Medical Advice | Payer: Medicaid Other

## 2023-04-17 DIAGNOSIS — F4310 Post-traumatic stress disorder, unspecified: Secondary | ICD-10-CM | POA: Diagnosis not present

## 2023-04-17 DIAGNOSIS — F3181 Bipolar II disorder: Secondary | ICD-10-CM | POA: Diagnosis not present

## 2023-04-17 DIAGNOSIS — F411 Generalized anxiety disorder: Secondary | ICD-10-CM | POA: Diagnosis not present

## 2023-04-18 ENCOUNTER — Telehealth (INDEPENDENT_AMBULATORY_CARE_PROVIDER_SITE_OTHER): Payer: Medicaid Other | Admitting: Psychiatry

## 2023-04-18 ENCOUNTER — Encounter: Payer: Self-pay | Admitting: Psychiatry

## 2023-04-18 DIAGNOSIS — G4701 Insomnia due to medical condition: Secondary | ICD-10-CM

## 2023-04-18 DIAGNOSIS — F3176 Bipolar disorder, in full remission, most recent episode depressed: Secondary | ICD-10-CM | POA: Diagnosis not present

## 2023-04-18 DIAGNOSIS — F411 Generalized anxiety disorder: Secondary | ICD-10-CM

## 2023-04-18 DIAGNOSIS — F4310 Post-traumatic stress disorder, unspecified: Secondary | ICD-10-CM

## 2023-04-18 NOTE — Progress Notes (Signed)
Virtual Visit via Video Note  I connected with Alison [name] on 04/18/23 at 11:30 AM EDT by a video enabled telemedicine application and verified that I am speaking with the correct person using two identifiers.  Location Provider Location : [facility] Patient Location : Home  Participants: Patient , Provider    I discussed the limitations of evaluation and management by telemedicine and the availability of in person appointments. The patient expressed understanding and agreed to proceed.    I discussed the assessment and treatment plan with the patient. The patient was provided an opportunity to ask questions and all were answered. The patient agreed with the plan and demonstrated an understanding of the instructions.   The patient was advised to call back or seek an in-person evaluation if the symptoms worsen or if the condition fails to improve as anticipated.   BH MD OP Progress Note  04/18/2023 12:04 PM Alison [name]  MRN:  [id number]  Chief Complaint:  Chief Complaint  Patient presents with   Follow-up   Anxiety   Depression   Medication Refill   HPI: Alison [name] is a 47 year old Caucasian female, lives in [place], has a history of bipolar disorder, PTSD, medical problems including fibromyalgia, chronic pain, trigeminal neuralgia, history of liver transplant, B-cell lymphoma, SLE, diabetes mellitus, stage IV chronic kidney disease, OSA, chronic migraine headaches, hyperlipidemia, hypertension recent hospital admission for sepsis was evaluated by telemedicine today.  Patient today reports she recently was evaluated by cardiology-dated 03/26/2023.  She had to wear a Zio heart monitor, reports she has not received the report yet.  However she did have an EKG completed.  I have reviewed notes per Alison [name] --cardiology dated 03/26/2023-EKG-QTc-452-stable."  Patient reports she is currently anxious about the fact that she is unable to get [facility] ER.  She hence has  been taking a lower dosage.  Likely having some internal nervousness, likely from withdrawal.  She also is not pain since her pain is not well-managed.  She has reached out to her providers.  Patient denies any significant depression symptoms.  Reports sleep is overall good.  Patient is currently compliant on medications like venlafaxine, mirtazapine, Latuda.  Patient denies side effects.  Patient continues to have good support system from her friends.  Patient denies any other concerns today.  Visit Diagnosis:    ICD-10-CM   1. Bipolar 1 disorder, depressed, full remission (HCC)  F31.76     2. PTSD (post-traumatic stress disorder)  F43.10     3. GAD (generalized anxiety disorder)  F41.1     4. Insomnia due to medical condition  G47.01    mood, pain      Past Psychiatric History: I have reviewed past psychiatric history from progress note on 06/10/2019.  Past Medical History:  Past Medical History:  Diagnosis Date   Abnormal uterine bleeding    Allergy    Anxiety    Arthritis    Bipolar disorder (manic depression) (HCC)    Chronic kidney failure    Chronic renal disease, stage III (HCC)    Diabetes mellitus without complication (HCC)    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HCC) 2015   Right axillary lymph node resected and chemo tx's.   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   Chemo tx's   Migraine    Morbid obesity (HCC)    Neuromuscular disorder (HCC)    neuropathy   Neuropathy    Personality disorder (HCC)    Pineal gland cyst    Post herpetic neuralgia    PTSD (post-traumatic stress disorder)    Renal disorder    S/P liver transplant (HCC) 1993   Sleep apnea    has not  recieved cpap yet-other one was recalled    Past Surgical History:  Procedure Laterality Date   BONE MARROW BIOPSY  01/13/2015   BREAST BIOPSY  12/2014   BREAST BIOPSY  2011   BREAST SURGERY     CHOLECYSTECTOMY     COLONOSCOPY     COLONOSCOPY WITH PROPOFOL N/A 02/28/2022   Procedure: COLONOSCOPY WITH PROPOFOL;  Surgeon: Midge Minium, MD;  Location: [facility] ENDOSCOPY;  Service: Endoscopy;  Laterality: N/A;   ESOPHAGOGASTRODUODENOSCOPY     ESOPHAGOGASTRODUODENOSCOPY (EGD) WITH PROPOFOL N/A 01/09/2021   Procedure: ESOPHAGOGASTRODUODENOSCOPY (EGD) WITH PROPOFOL;  Surgeon: Midge Minium, MD;  Location: [facility] ENDOSCOPY;  Service: Endoscopy;  Laterality: N/A;   HERNIA REPAIR     x4-all from liver transplant   HYSTEROSCOPY WITH D & C N/A 11/13/2021   Procedure: DILATATION AND CURETTAGE /HYSTEROSCOPY;  Surgeon: Natale Milch, MD;  Location: [facility] ORS;  Service: Gynecology;  Laterality: N/A;   infusaport     LIVER TRANSPLANT  12/17/1991   LUMBAR PUNCTURE     PORT A CATH INJECTION ([facility] HX)     RENAL BIOPSY     tumor removal  2015   cancer right side    Family Psychiatric History: I have reviewed family psychiatric history from progress note on 06/10/2019.  Family History:  Family History  Problem Relation Age of Onset   Heart disease Mother    Hypertension Mother    Cancer - Other Mother    Bipolar disorder Mother    Heart disease Father    Hypertension Father    Diabetes Father    Parkinson's disease Maternal Grandmother    Cancer Maternal Aunt    Cancer Maternal Uncle    Cancer Maternal Grandfather    Lupus Paternal Grandmother    Hypertension Brother     Social History: I have reviewed social history from progress note on 06/10/2019. Social History   Socioeconomic History   Marital status: Single    Spouse name: Not on file   Number of children: 0   Years of education: 9   Highest education level: GED or equivalent  Occupational History   Occupation: disabled  Tobacco  Use   Smoking status: Never   Smokeless tobacco: Never  Vaping Use   Vaping status: Never Used  Substance and Sexual Activity   Alcohol use: Not Currently   Drug use: Not Currently    Comment: pain managment last used in early april   Sexual activity: Not Currently  Other Topics Concern   Not on file  Social History Narrative   Not on file   Social Determinants of Health   Financial Resource Strain: Low Risk  (03/25/2023)   Overall Financial Resource Strain (CARDIA)    Difficulty of Paying Living Expenses: Not very hard  Food Insecurity: No Food Insecurity (03/06/2023)   Hunger Vital Sign    Worried About Running Out of Food  in the Last Year: Never true    Ran Out of Food in the Last Year: Never true  Transportation Needs: No Transportation Needs (03/06/2023)   PRAPARE - Administrator, Civil Service (Medical): No    Lack of Transportation (Non-Medical): No  Physical Activity: Insufficiently Active (03/25/2023)   Exercise Vital Sign    Days of Exercise per Week: 3 days    Minutes of Exercise per Session: 10 min  Stress: Stress Concern Present (11/11/2022)   [employer] of Occupational Health - Occupational Stress Questionnaire    Feeling of Stress : To some extent  Social Connections: Moderately Integrated (02/19/2023)   Social Connection and Isolation Panel [NHANES]    Frequency of Communication with Friends and Family: More than three times a week    Frequency of Social Gatherings with Friends and Family: More than three times a week    Attends Religious Services: More than 4 times per year    Active Member of [employer] or Organizations: No    Attends Banker Meetings: Never    Marital Status: Living with partner    Allergies:  Allergies  Allergen Reactions   Penicillin G Itching, Rash, Anaphylaxis and Palpitations   Lamictal [Lamotrigine] Rash   Carbamazepine Other (See Comments)    Medication interaction-prograf    Hydrocodone-Acetaminophen  Itching   Naproxen Itching   Penicillins    Vancomycin Rash    Macular/blotchy rash at infusion site    Metabolic Disorder Labs: Lab Results  Component Value Date   HGBA1C 8.8 (H) 03/07/2023   MPG 206 03/07/2023   MPG 209 06/05/2020   Lab Results  Component Value Date   PROLACTIN 16.4 12/12/2022   PROLACTIN 13.4 07/15/2019   Lab Results  Component Value Date   CHOL 66 03/08/2023   TRIG 87 03/08/2023   HDL 30 (L) 03/08/2023   CHOLHDL 2.2 03/08/2023   VLDL 17 03/08/2023   LDLCALC 19 03/08/2023   LDLCALC 19 05/27/2022   Lab Results  Component Value Date   TSH 4.878 (H) 03/09/2023   TSH 2.650 07/15/2019    Therapeutic Level Labs: No results found for: "LITHIUM" No results found for: "VALPROATE" No results found for: "CBMZ"  Current Medications: Current Outpatient Medications  Medication Sig Dispense Refill   baclofen (LIORESAL) 10 MG tablet Take 1 tablet by mouth 2 (two) times daily.     benztropine (COGENTIN) 1 MG tablet Take 1 tablet (1 mg total) by mouth daily as needed for tremors. 90 tablet 0   Cholecalciferol (VITAMIN D) 125 MCG (5000 UT) CAPS Take 5,000 Units by mouth daily.     colchicine 0.6 MG tablet Take 1 tablet (0.6 mg total) by mouth 2 (two) times daily. 120 tablet 0   dronabinol (MARINOL) 2.5 MG capsule Take 2.5 mg by mouth See admin instructions. Takes every other day     Dulaglutide 4.5 MG/0.5ML SOPN Inject 4.5 mg into the skin once a week.     ezetimibe (ZETIA) 10 MG tablet Take 10 mg by mouth every morning.     ibuprofen (ADVIL) 400 MG tablet Take 1 tablet (400 mg total) by mouth every 6 (six) hours as needed. 30 tablet 0   icosapent Ethyl (VASCEPA) 1 g capsule Take 2 capsules (2 g total) by mouth 2 (two) times daily. 120 capsule 5   Insulin Degludec (TRESIBA) 100 UNIT/ML SOLN Inject 30 Units into the skin.     Insulin Glargine (BASAGLAR KWIKPEN) 100 UNIT/ML Inject 40 Units  into the skin at bedtime.     lisinopril (ZESTRIL) 20 MG tablet TAKE ONE  TABLET BY MOUTH ONCE DAILY 90 tablet 3   lurasidone (LATUDA) 20 MG TABS tablet Take 1 tablet (20 mg total) by mouth daily with supper. 90 tablet 0   magnesium oxide (MAG-OX) 400 MG tablet Take by mouth.     Melatonin 10 MG TABS Take 10 mg by mouth at bedtime.     meloxicam (MOBIC) 15 MG tablet Take 1 tablet (15 mg total) by mouth daily. 30 tablet 0   metFORMIN (GLUCOPHAGE) 1000 MG tablet Take 1 tablet (1,000 mg total) by mouth 2 (two) times daily with a meal. 180 tablet 3   methocarbamol (ROBAXIN) 500 MG tablet Take 1 tablet (500 mg total) by mouth 4 (four) times daily. 16 tablet 0   mirabegron ER (MYRBETRIQ) 50 MG TB24 tablet Take 1 tablet (50 mg total) by mouth daily. 90 tablet 3   mirtazapine (REMERON) 15 MG tablet Take 1 tablet (15 mg total) by mouth at bedtime. TAKE ONE TABLET BY MOUTH EVERYDAY AT BEDTIME 90 tablet 0   naloxone (NARCAN) nasal spray 4 mg/0.1 mL Place 0.4 mg into the nose once.     NOVOLOG FLEXPEN 100 UNIT/ML FlexPen Inject 0-30 Units into the skin 3 (three) times daily with meals. Sliding scale     pregabalin (LYRICA) 200 MG capsule Take 1 capsule (200 mg total) by mouth 2 (two) times daily. 60 capsule 0   rosuvastatin (CRESTOR) 10 MG tablet Take 1 tablet (10 mg total) by mouth daily. 90 tablet 2   tacrolimus (PROGRAF) 1 MG capsule Take 1 capsule by mouth every morning.     UBRELVY 50 MG TABS Take 1 tablet by mouth daily as needed.     venlafaxine XR (EFFEXOR XR) 37.5 MG 24 hr capsule Take 1 capsule (37.5 mg total) by mouth daily with breakfast. Take along with 75 mg daily 90 capsule 0   venlafaxine XR (EFFEXOR XR) 75 MG 24 hr capsule Take 1 capsule (75 mg total) by mouth daily with breakfast. 90 capsule 0   XTAMPZA ER 9 MG C12A Take 1 capsule by mouth every 12 (twelve) hours.     PRECISION QID TEST test strip  (Patient not taking: Reported on 03/19/2023)     No current facility-administered medications for this visit.     Musculoskeletal: Strength & Muscle Tone:   UTA Gait & Station:  Seated Patient leans: N/A  Psychiatric Specialty Exam: Review of Systems  Psychiatric/Behavioral:  The patient is nervous/anxious.     There were no vitals taken for this visit.There is no height or weight on file to calculate BMI.  General Appearance: Fairly Groomed  Eye Contact:  Fair  Speech:  Clear and Coherent  Volume:  Normal  Mood:  Anxious  Affect:  Congruent  Thought Process:  Goal Directed and Descriptions of Associations: Intact  Orientation:  Full (Time, Place, and Person)  Thought Content: Logical   Suicidal Thoughts:  No  Homicidal Thoughts:  No  Memory:  Immediate;   Fair Recent;   Fair Remote;   Fair  Judgement:  Fair  Insight:  Fair  Psychomotor Activity:  Normal  Concentration:  Concentration: Fair and Attention Span: Fair  Recall:  [employer] of Knowledge: Fair  Language: Fair  Akathisia:  No  Handed:  Right  AIMS (if indicated): not done  Assets:  Communication Skills Desire for Improvement Housing Social Support  ADL's:  Intact  Cognition: WNL  Sleep:  Fair   Screenings: Midwife Visit from 12/12/2022 in [facility] Office Visit from 09/12/2022 in [facility] Office Visit from 07/08/2022 in [facility] Video Visit from 03/18/2022 in [facility] Video Visit from 02/14/2022 in [facility]  AIMS Total Score 0 0 0 0 0      GAD-7    Flowsheet Row Office Visit from 03/19/2023 in [facility] Video Visit from 03/17/2023 in [facility] Office Visit from 01/22/2023 in [facility] Office Visit from 12/12/2022 in [facility] Office Visit from 09/12/2022 in [facility]  Psychiatric Associates  Total GAD-7 Score 2 14 8 7 3       PHQ2-9    Flowsheet Row Office Visit from 03/19/2023 in [facility] Video Visit from 03/17/2023 in [facility] Office Visit from 01/22/2023 in [facility] Office Visit from 12/12/2022 in [facility] Office Visit from 09/12/2022 in [facility]  PHQ-2 Total Score 2 0 4 2 0  PHQ-9 Total Score 6 -- 11 7 --      Flowsheet Row Video Visit from 04/18/2023 in [facility] Video Visit from 03/17/2023 in [facility] ED to Hosp-Admission (Discharged) from 03/06/2023 in [facility]C MEDICAL TELEMETRY  C-SSRS RISK CATEGORY Low Risk Low Risk No Risk        Assessment and Plan: AYANO PHILEMON is a 47 year old Caucasian female on disability with bipolar disorder, PTSD, multiple medical problems was evaluated by telemedicine today.  Patient is currently in pain, likely due to being a longer dosage of Xtampza ER which does contribute to some anxiety otherwise doing fairly well on the current medication regimen.  Discussed plan as noted below.  Plan Bipolar disorder type I depressed in remission Latuda 20 mg p.o. daily with supper Venlafaxine 112.5 mg p.o. daily Cogentin 1 mg p.o. daily as needed for side effects.  GAD-improving Continue mirtazapine 15 mg p.o. nightly Venlafaxine 112.5 mg p.o. daily. Patient to continue CBT.  PTSD-stable Venlafaxine as prescribed Continue CBT  Insomnia-stable Mirtazapine 15 mg p.o. nightly Continue CPAP Melatonin 5 to 10 mg p.o. nightly Continue sleep hygiene techniques.   I have reviewed notes per cardiology-as noted above-dated 03/26/2023-QTc is currently stable.   Consent: Patient/Guardian gives verbal consent for treatment  and assignment of benefits for services provided during this visit. Patient/Guardian expressed understanding and agreed to proceed.   Follow-up in clinic in 3 months or sooner in person.  This note was generated in part or whole with voice recognition software. Voice recognition is usually quite accurate but there are transcription errors that can and very often do occur. I apologize for any typographical errors that were not detected and corrected.    Alison Longs, MD 04/18/2023, 12:04 PM

## 2023-04-21 ENCOUNTER — Other Ambulatory Visit: Payer: Self-pay

## 2023-04-21 ENCOUNTER — Encounter: Payer: Self-pay | Admitting: Emergency Medicine

## 2023-04-21 ENCOUNTER — Emergency Department
Admission: EM | Admit: 2023-04-21 | Discharge: 2023-04-21 | Disposition: A | Discharge status: Home / Self Care | Payer: Medicaid Other | Attending: Emergency Medicine | Admitting: Emergency Medicine

## 2023-04-21 DIAGNOSIS — G500 Trigeminal neuralgia: Secondary | ICD-10-CM | POA: Insufficient documentation

## 2023-04-21 DIAGNOSIS — R519 Headache, unspecified: Secondary | ICD-10-CM | POA: Diagnosis present

## 2023-04-21 MED ORDER — FENTANYL CITRATE PF 50 MCG/ML IJ SOSY
50.0000 ug | PREFILLED_SYRINGE | Freq: Once | INTRAMUSCULAR | Status: AC
  Administered 2023-04-21: 50 ug via INTRAMUSCULAR
  Filled 2023-04-21: qty 1

## 2023-04-21 MED ORDER — OXYCODONE HCL 5 MG PO TABS: 5.0000 mg | ORAL_TABLET | Freq: Every day | ORAL | 0 refills | Status: AC | PRN

## 2023-04-21 NOTE — ED Triage Notes (Signed)
Pt sts she has a history of trigeminal neuralgia. Has prescribed xtampza which has now ran out. Last dose two days ago. Presents to ED requesting medication for pain management. Sts worsening right facial pain. Denies change in s/s.

## 2023-04-21 NOTE — ED Provider Notes (Signed)
[facility] Emergency Department Provider Note     Event Date/Time   First MD Initiated Contact with Patient 04/21/23 1944     (approximate)   History   Pain Management   HPI  Alison [name] is a 47 y.o. female with a history of trigeminal neuralgia presents to the emergency department for pain management of an acute trigeminal neuralgia flareup.  Patient states right side facial pain described as burning.  Pain score 7/10.  Reports worsening over the past 12 hours. Patient reports running out of her medication 2 days ago of Xtampza that is currently requiring a prior authorization at her pharmacy for refill.  Patient sees Dr Welton Flakes of [facility].  Denies visual changes, headache, vomiting, numbness.    Physical Exam   Triage Vital Signs: ED Triage Vitals [04/21/23 1921]  Encounter Vitals Group     BP 125/78     Systolic BP Percentile      Diastolic BP Percentile      Pulse Rate 87     Resp (!) 21     Temp 98.8 F (37.1 C)     Temp Source Oral     SpO2 100 %     Weight 252 lb (114.3 kg)     Height 5\' 6"  (1.676 m)     Head Circumference      Peak Flow      Pain Score 10     Pain Loc      Pain Education      Exclude from Growth Chart     Most recent vital signs: Vitals:   04/21/23 1921 04/21/23 2130  BP: 125/78   Pulse: 87   Resp: (!) 21 19  Temp: 98.8 F (37.1 C)   SpO2: 100%     General Awake, no distress.  HEENT NCAT. PERRL. EOMI. TTP over right side of face over chin anterior to the ear and over right side of forehead.[name] CV:  Good peripheral perfusion.  RESP:  Normal effort.  ABD:  No distention.  Other:  Cranial nerves intact.  Sensation and motor function intact.  ED Results / Procedures / Treatments   Labs (all labs ordered are listed, but only abnormal results are displayed) Labs Reviewed - No data to display  History and physical examination do not warrant a lab work up or imaging at this time.    No results found.   PROCEDURES:  Critical Care performed: No  Procedures  MEDICATIONS ORDERED IN ED: Medications  fentaNYL (SUBLIMAZE) injection 50 mcg (50 mcg Intramuscular Given 04/21/23 2005)    IMPRESSION / MDM / ASSESSMENT AND PLAN / ED COURSE  I reviewed the triage vital signs and the nursing notes.                              Clinical Course as of 04/21/23 2235  Mon Apr 21, 2023  1950 X trigeminal neuralgia  Xtampza requiring prior authorization for refill at pharmacy ran out 2 days ago Dr Welton Flakes anesthesia  Right side facial pain TTP. Pain score 7/10.  Worsening over 12 hours.  [MH]    Clinical Course User Index [MH] Conrad [place], PA-C    47 y.o. female presents to the emergency department for evaluation and treatment of acute flareup of trigeminal neuralgia of the right side. See HPI for further details.   Differential diagnosis includes, but is not  limited to trigeminal neuralgia, headache, Bell's palsy, CVA.  Patient is given fentanyl pain in ED.  On reevaluation reports great improvement.  Patient is requesting for pain medication at home in the interim to following up with her pain specialist.  He is encouraged to have a close follow-up with her pain specialist.  At this time patient is in satisfactory and stable condition for discharge and outpatient follow-up.  Patient will be discharged home prescription for oxycodone given her allergy to hydrocodone acetaminophen. Patient is given ED precautions to return to the ED for any worsening or new symptoms. Patient verbalizes understanding. All questions and concerns were addressed during ED visit.    Patient's presentation is most consistent with exacerbation of chronic illness.  FINAL CLINICAL IMPRESSION(S) / ED DIAGNOSES   Final diagnoses:  Trigeminal neuralgia of right side of face    Rx / DC Orders   ED Discharge Orders          Ordered    oxyCODONE (ROXICODONE) 5 MG immediate release tablet   Daily PRN        04/21/23 2125             Note:  This document was prepared using Dragon voice recognition software and may include unintentional dictation errors.    Romeo Apple, Kamran Coker A, PA-C 04/21/23 2235    Jene Every, MD 04/22/23 [id number]

## 2023-04-28 DIAGNOSIS — F3189 Other bipolar disorder: Secondary | ICD-10-CM | POA: Diagnosis not present

## 2023-04-28 DIAGNOSIS — G893 Neoplasm related pain (acute) (chronic): Secondary | ICD-10-CM | POA: Diagnosis not present

## 2023-04-28 DIAGNOSIS — M79603 Pain in arm, unspecified: Secondary | ICD-10-CM | POA: Diagnosis not present

## 2023-04-28 DIAGNOSIS — M79606 Pain in leg, unspecified: Secondary | ICD-10-CM | POA: Diagnosis not present

## 2023-04-28 DIAGNOSIS — M25519 Pain in unspecified shoulder: Secondary | ICD-10-CM | POA: Diagnosis not present

## 2023-04-28 DIAGNOSIS — F320 Major depressive disorder, single episode, mild: Secondary | ICD-10-CM | POA: Diagnosis not present

## 2023-04-28 DIAGNOSIS — Z79891 Long term (current) use of opiate analgesic: Secondary | ICD-10-CM | POA: Diagnosis not present

## 2023-04-28 DIAGNOSIS — I10 Essential (primary) hypertension: Secondary | ICD-10-CM | POA: Diagnosis not present

## 2023-04-28 DIAGNOSIS — M79669 Pain in unspecified lower leg: Secondary | ICD-10-CM | POA: Diagnosis not present

## 2023-04-28 DIAGNOSIS — M5414 Radiculopathy, thoracic region: Secondary | ICD-10-CM | POA: Diagnosis not present

## 2023-04-28 DIAGNOSIS — M5442 Lumbago with sciatica, left side: Secondary | ICD-10-CM | POA: Diagnosis not present

## 2023-04-29 ENCOUNTER — Ambulatory Visit: Payer: Medicaid Other | Admitting: Obstetrics and Gynecology

## 2023-04-29 ENCOUNTER — Encounter: Payer: Self-pay | Admitting: Obstetrics and Gynecology

## 2023-04-29 NOTE — Patient Instructions (Signed)
Hi Ms. Hemrick am glad you are doing okay-thanks for the updates today.  Ms. Bennetts was given information about Medicaid Managed Care team care coordination services as a part of their Healthy [facility] Medicaid benefit. [name] verbally consented to engagement with the [facility] Managed Care team.   If you are experiencing a medical emergency, please call 911 or report to your local emergency department or urgent care.   If you have a non-emergency medical problem during routine business hours, please contact your provider's office and ask to speak with a nurse.   For questions related to your Healthy [facility] health plan, please call: [phone] or visit the homepage here: [email or web address]  If you would like to schedule transportation through your Healthy [facility] plan, please call the following number at least 2 days in advance of your appointment: [phone]  For information about your ride after you set it up, call Ride Assist at [phone]. Use this number to activate a Will Call pickup, or if your transportation is late for a scheduled pickup. Use this number, too, if you need to make a change or cancel a previously scheduled reservation.  If you need transportation services right away, call [phone]. The after-hours call center is staffed 24 hours to handle ride assistance and urgent reservation requests (including discharges) 365 days a year. Urgent trips include sick visits, hospital discharge requests and life-sustaining treatment.  Call the [facility] Line at [phone], at any time, 24 hours a day, 7 days a week. If you are in danger or need immediate medical attention call 911.  If you would like help to quit smoking, call 1-800-QUIT-NOW (305-736-6265) OR Espaol: 1-855-Djelo-Ya (6-237-628-3151) o para ms informacin haga clic aqu or Text READY to 761-607 to register via text  Ms. Bougher -  following are the goals we discussed in your visit today:   Goals Addressed             This Visit's Progress    Chronic Pain Managed       04/29/23:  Patient awaiting pain medication refill-prescribed Oxycodone by Dr. Welton Flakes until authorization received from insurance.     Get sugars at goal       Timeframe:  Short-Term Goal Priority:  High Start Date:                             Expected End Date:      ongoing                 Follow Up Date: 06/05/23   - call for medicine refill 2 or 3 days before it runs out - call if I am sick and can't take my medicine - keep a list of all the medicines I take; vitamins and herbals too    Why is this important?   These steps will help you keep on track with your medicines. 04/29/23:  Blood sugars 200s per patient   Patient verbalizes understanding of instructions and care plan provided today and agrees to view in MyChart. Active MyChart status and patient understanding of how to access instructions and care plan via MyChart confirmed with patient.     The Managed Medicaid care management team will reach out to the patient again over the next 45 business  days.  The  Patient has been provided with contact information for the Managed Medicaid care management team and has been advised to call with any  health related questions or concerns.   Kathi Der RN, [name]   [facility] Care Management Coordinator - Managed Medicaid High Risk [phone]   Following is a copy of your plan of care:  Care Plan : Chronic Pain (Adult)  Updates made by [name], RN since 04/29/2023 12:00 AM     Problem: Chronic Pain Management-fibromyalgia   Priority: High  Onset Date: 01/22/2021     Long-Range Goal: Fibromyalgia pain managed-new pain management provider   Start Date: 08/14/2020  Expected End Date: 06/25/2023  Recent Progress: On track  Priority: High  Note:   Current Barriers:  Knowledge deficits related to pain  management  04/29/23:  patient awaiting auth for pain med-Dr. Welton Flakes prescribed Oxycodone until approval by insurance.  Nurse Case Manager Clinical Goal(s):  Over the next 45 days, patient will work with [facility] to address needs related to referral for pain management provider and associated care coordination needs. 05/24/22:  patient is currently seeing Dr. Welton Flakes at [facility].  Interventions:  Inter-disciplinary care team collaboration (see longitudinal plan of care) Evaluation of current treatment plan related to fibromyalgia  and patient's adherence to plan as established by provider. Update 06/15/21:  Patient taking Baclofen again, Effexor added. Collaborated with primary care provider regarding recommendations and referral to pain management provider. Discussed plans with patient for ongoing care management follow up and provided patient with direct contact information for care management team Anticipate pain education program, pain management support as part of pain management referral.       Patient Goals/Self-Care Activities Over the next 45 days, patient will:  -Attends all scheduled provider appointments  develop a personal pain management plan with your pain management provider. Patient will f/u with Dr. Welton Flakes and insurance regarding auth.  Follow Up Plan:  The Managed Medicaid care management team will reach out to the patient again over the next 45 business days.    Care Plan : Wellness (Adult)  Updates made by [name], RN since 04/29/2023 12:00 AM     Problem: Medication Adherence (Wellness)   Priority: High  Onset Date: 01/22/2021     Long-Range Goal: Medication Adherence Maintained   Start Date: 09/06/2020  Expected End Date: 06/25/2023  Recent Progress: On track  Priority: High  Note:   Current Barriers:  Chronic Disease Management of chronic health conditions, OSD, migraines, DM, HTN, chronic pain, anxiety/depression/ Bipolar/PTSD, SLE, LDD,  HLD, fibromyalgia, osteoarthritis, LBP, DLBCL, liver transplant, lymphoma 04/29/23:  Recently seen by CARDS and Nutrition.  Awaiting pain med auth.  Blood sugars 200s, BP stable.  Continues therapy services  Nurse Case Manager Clinical Goal(s):  Over the next 30 days, patient will work with CM team pharmacist to review current medications. Over the next 30 days, patient will meet with Nutritionist and check her blood sugars. Over the next 30 days, patient will attend all scheduled appointments.  Interventions:  Inter-disciplinary care team collaboration (see longitudinal plan of care) Evaluation of current treatment plan and patient's adherence to plan as established by provider. Advised patient to contact her PCP for any medication needs. Reviewed medications with patient. Collaborated with pharmacy regarding medications.  Discussed plans with patient for ongoing care management follow up and provided patient with direct contact information for care management team Pharmacy referral for medication review. Patient given phone number for [facility] transportation if needed. Will notify PCP of need for testing strips and dietician referral. Collaborated with PCP for cardiology  appointment. Collaborated with [name] for dental resources. [name] referral for dental resources-completed.  Hyperlipidemia Interventions:  (Status:  New goal.) Long Term Goal Medication review performed; medication list updated in electronic medical record.  Provider established cholesterol goals reviewed Counseled on importance of regular laboratory monitoring as prescribed Reviewed importance of limiting foods high in cholesterol Reviewed exercise goals and target of 150 minutes per week Screening for signs and symptoms of depression related to chronic disease state Assessed social determinant of health barriers    Diabetes Interventions:  (Status:  New goal.) Long Term Goal Assessed patient's  understanding of A1c goal: <7% Reviewed medications with patient and discussed importance of medication adherence Counseled on importance of regular laboratory monitoring as prescribed Discussed plans with patient for ongoing care management follow up and provided patient with direct contact information for care management team Reviewed scheduled/upcoming provider appointments Advised patient, providing education and rationale, to check cbg as directed and record, calling provider for findings outside established parameters Review of patient status, including review of consultants reports, relevant laboratory and other test results, and medications completed Assessed social determinant of health barriers Lab Results  Component Value Date   HGBA1C 6.4 03/06/2022             HGBA1C                                                      8.8                                    03/19/223  Hyperlipidemia Interventions:  (Status:  New goal.) Long Term Goal Medication review performed; medication list updated in electronic medical record.  Provider established cholesterol goals reviewed Reviewed importance of limiting foods high in cholesterol Assessed social determinant of health barriers   Hypertension Interventions:  (Status:  New goal.) Long Term Goal Last practice recorded BP readings:  BP Readings from Last 3 Encounters:   01/22/23        116/74 03/19/23        124/72 04/29/23        120/68  Most recent eGFR/CrCl:  Lab Results  Component Value Date   EGFR 29 (L) 05/27/2022    No components found for: "CRCL"  Evaluation of current treatment plan related to hypertension self management and patient's adherence to plan as established by provider Reviewed medications with patient and discussed importance of compliance Discussed plans with patient for ongoing care management follow up and provided patient with direct contact information for care management team Advised patient, providing  education and rationale, to monitor blood pressure daily and record, calling PCP for findings outside established parameters Reviewed scheduled/upcoming provider appointments including:  Provided education on prescribed diet Discussed complications of poorly controlled blood pressure such as heart disease, stroke, circulatory complications, vision complications, kidney impairment, sexual dysfunction Assessed social determinant of health barriers  Pain Interventions:  (Status:  New goal.) Long Term Goal Pain assessment performed Medications reviewed Reviewed provider established plan for pain management Discussed importance of adherence to all scheduled medical appointments Counseled on the importance of reporting any/all new or changed pain symptoms or management strategies to pain management provider Advised patient to report to care team affect of pain on daily activities Discussed use  of relaxation techniques and/or diversional activities to assist with pain reduction (distraction, imagery, relaxation, massage, acupressure, TENS, heat, and cold application Reviewed with patient prescribed pharmacological and nonpharmacological pain relief strategies Assessed social determinant of health barriers   Patient Goals/Self-Care Activities Over the next 30 days, patient will:  -Patient will take medications as prescribed. Calls pharmacy for medication refills Calls provider office for new concerns or questions Patient to schedule an appt with DERM/ Nephrology, ENDO  Follow Up Plan: The Managed Medicaid care management team will reach out to the patient again over the next 45 business days.  The patient has been provided with contact information for the Managed Medicaid care management team and has been advised to call with any health related questions or concerns.

## 2023-04-29 NOTE — Patient Outreach (Signed)
Medicaid Managed Care   Nurse Care Manager Note  04/29/2023 Name:  Alison [name] MRN:  [id number] DOB:  03/11/1976  Alison [name] is an 47 y.o. year old female who is a primary patient of [name], FNP.  The [facility] Managed Care Coordination team was consulted for assistance with:    Chronic healthcare management needs, anxiety/depression/BP/PTSD, OSA, migraines, DM, HTN, chronic pain, SLE, LDD, HLD, fibromyalgia, osteoarthritis, LBP, DLBCL, h/o liver transplant, lymphoma, CKD-3  Alison [name] was given information about Medicaid Managed Care Coordination team services today. [name] Patient agreed to services and verbal consent obtained.  Engaged with patient by telephone for follow up visit in response to provider referral for case management and/or care coordination services.   Assessments/Interventions:  Review of past medical history, allergies, medications, health status, including review of consultants reports, laboratory and other test data, was performed as part of comprehensive evaluation and provision of chronic care management services.  SDOH (Social Determinants of Health) assessments and interventions performed: SDOH Interventions    Flowsheet Row Patient Outreach Telephone from 04/29/2023 in [place] POPULATION HEALTH DEPARTMENT Patient Outreach Telephone from 03/25/2023 in [place] POPULATION HEALTH DEPARTMENT Patient Outreach Telephone from 01/10/2023 in [place] POPULATION HEALTH DEPARTMENT Patient Outreach Telephone from 12/11/2022 in [place] POPULATION HEALTH DEPARTMENT Patient Outreach Telephone from 11/11/2022 in [place] POPULATION HEALTH DEPARTMENT Patient Outreach Telephone from 10/15/2022 in  POPULATION HEALTH DEPARTMENT  SDOH Interventions        Food Insecurity Interventions -- -- Intervention Not Indicated -- -- --  Housing Interventions -- -- -- Intervention Not Indicated -- --  Transportation Interventions -- -- Intervention  Not Indicated -- -- --  Utilities Interventions -- -- -- Intervention Not Indicated -- --  Alcohol Usage Interventions -- -- -- -- Intervention Not Indicated (Score <7) --  Financial Strain Interventions -- Intervention Not Indicated -- -- -- Intervention Not Indicated  Physical Activity Interventions -- Other (Comments)  [not able to enage in moderate to strenuous exercise at this level] -- -- -- Other (Comments)  [patient does not feel like exercising right now]  Stress Interventions Provide Counseling  [followed by Psychiatry] -- -- -- Provide Counseling  [sees Psychiatrist on regular basis] --  Health Literacy Interventions Intervention Not Indicated -- -- -- -- --     Care Plan  Allergies  Allergen Reactions   Penicillin G Itching, Rash, Anaphylaxis and Palpitations   Lamictal [Lamotrigine] Rash   Carbamazepine Other (See Comments)    Medication interaction-prograf    Hydrocodone-Acetaminophen Itching   Naproxen Itching   Penicillins    Vancomycin Rash    Macular/blotchy rash at infusion site    Medications Reviewed Today     Reviewed by [name], RN (Registered Nurse) on 04/29/23 at 1244  Med List Status: <None>   Medication Order Taking? Sig Documenting Provider Last Dose Status Informant  baclofen (LIORESAL) 10 MG tablet [id number] No Take 1 tablet by mouth 2 (two) times daily. [provider] Taking Active Self, Pharmacy Records  benztropine (COGENTIN) 1 MG tablet [id number] No Take 1 tablet (1 mg total) by mouth daily as needed for tremors. [name], MD Taking Active   Cholecalciferol (VITAMIN D) 125 MCG (5000 UT) CAPS [id number] No Take 5,000 Units by mouth daily. [provider] Taking Active Self, Pharmacy Records  colchicine 0.6 MG tablet [id number] No Take 1 tablet (0.6 mg total) by mouth 2 (two) times daily. Creig Hines, NP Taking Active  dronabinol (MARINOL) 2.5 MG capsule [id number] No Take 2.5 mg by mouth See admin  instructions. Takes every other day [provider] Taking Active Self, Pharmacy Records  Dulaglutide 4.5 MG/0.5ML SOPN [id number] No Inject 4.5 mg into the skin once a week. [provider] Taking Active Self, Pharmacy Records  ezetimibe (ZETIA) 10 MG tablet [id number] No Take 10 mg by mouth every morning. [provider] Taking Active Self, Pharmacy Records  ibuprofen (ADVIL) 400 MG tablet [id number] No Take 1 tablet (400 mg total) by mouth every 6 (six) hours as needed. Marcelino Duster, MD Taking Active   icosapent Ethyl (VASCEPA) 1 g capsule [id number] No Take 2 capsules (2 g total) by mouth 2 (two) times daily. Debbe Odea, MD Taking Active Self, Pharmacy Records           Med Note [place], [name]   Tue Mar 19, 2022 10:24 AM) Taking 1 capsule twice daily per Dr. Patrecia Pace  Insulin Degludec (TRESIBA) 100 UNIT/ML SOLN [id number] No Inject 30 Units into the skin. [provider] Taking Active Self, Pharmacy Records  Insulin Glargine [facility]) 100 UNIT/ML [id number] No Inject 40 Units into the skin at bedtime. [provider] Taking Active Self, Pharmacy Records  lisinopril (ZESTRIL) 20 MG tablet [id number] No TAKE ONE TABLET BY MOUTH ONCE DAILY [name], FNP Taking Active Self, Pharmacy Records  lurasidone (LATUDA) 20 MG TABS tablet [id number] No Take 1 tablet (20 mg total) by mouth daily with supper. [name], MD Taking Active   magnesium oxide (MAG-OX) 400 MG tablet [id number] No Take by mouth. [provider] Taking Active Self, Pharmacy Records  Melatonin 10 MG TABS [id number] No Take 10 mg by mouth at bedtime. [provider] Taking Active Self, Pharmacy Records  meloxicam (MOBIC) 15 MG tablet [id number] No Take 1 tablet (15 mg total) by mouth daily. Cuthriell, [name], PA-C Taking Active Self, Pharmacy Records  metFORMIN (GLUCOPHAGE) 1000 MG tablet [id number] No Take 1 tablet (1,000 mg total) by mouth  2 (two) times daily with a meal. [name], FNP Taking Active Self, Pharmacy Records  methocarbamol (ROBAXIN) 500 MG tablet [id number] No Take 1 tablet (500 mg total) by mouth 4 (four) times daily. Cuthriell, [name], PA-C Taking Active Self, Pharmacy Records  mirabegron ER (MYRBETRIQ) 50 MG TB24 tablet [id number] No Take 1 tablet (50 mg total) by mouth daily. [name], FNP Taking Active Self, Pharmacy Records  mirtazapine (REMERON) 15 MG tablet [id number] No Take 1 tablet (15 mg total) by mouth at bedtime. TAKE ONE TABLET BY MOUTH EVERYDAY AT BEDTIME [name], MD Taking Active   naloxone [facility]) nasal spray 4 mg/0.1 mL [id number] No Place 0.4 mg into the nose once. [provider] Taking Active Self, Pharmacy Records  NOVOLOG FLEXPEN 100 UNIT/ML FlexPen [id number] No Inject 0-30 Units into the skin 3 (three) times daily with meals. Sliding scale [provider] Taking Active Self, Pharmacy Records  PRECISION QID TEST test strip [id number] No   Patient not taking: Reported on 03/19/2023   [provider] Not Taking Active Self, Pharmacy Records  pregabalin (LYRICA) 200 MG capsule [id number] No Take 1 capsule (200 mg total) by mouth 2 (two) times daily. Phineas Semen, MD Taking Active Self, Pharmacy Records  rosuvastatin (CRESTOR) 10 MG tablet [id number] No Take 1 tablet (10 mg total) by mouth daily. Debbe Odea, MD Taking Active Self, Pharmacy Records  tacrolimus (PROGRAF) 1 MG capsule [id number] No Take 1 capsule by mouth every  morning. [provider] Taking Active Self, Pharmacy Records  UBRELVY 50 MG TABS [id number] No Take 1 tablet by mouth daily as needed. [provider] Taking Active Self, Pharmacy Records  venlafaxine XR (EFFEXOR XR) 37.5 MG 24 hr capsule [id number] No Take 1 capsule (37.5 mg total) by mouth daily with breakfast. Take along with 75 mg daily [name], Levin Bacon, MD Taking Active   venlafaxine XR (EFFEXOR XR) 75 MG  24 hr capsule [id number] No Take 1 capsule (75 mg total) by mouth daily with breakfast. [name], MD Taking Active   XTAMPZA ER 9 MG C12A [id number] No Take 1 capsule by mouth every 12 (twelve) hours. [provider] Taking Active Self, Pharmacy Records           Patient Active Problem List   Diagnosis Date Noted   Acute idiopathic pericarditis 03/09/2023   Acute pericarditis 03/08/2023   Sepsis (HCC) 03/06/2023   Chest pain 03/06/2023   CKD (chronic kidney disease), stage II 01/22/2023   Insulin dependent type 2 diabetes mellitus (HCC) 01/22/2023   Overactive bladder 01/22/2023   High risk medication use 12/12/2022   Sjogren's syndrome (HCC) 05/27/2022   Menorrhagia with irregular cycle    Akathisia 10/24/2021   At risk for prolonged QT interval syndrome 09/12/2021   Prolonged Q-T interval on ECG 08/29/2021   Insomnia 07/20/2021   Bipolar disorder, in full remission, most recent episode mixed (HCC) 07/20/2021   GAD (generalized anxiety disorder) 05/09/2021   Opioid dependence with opioid-induced disorder (HCC) 12/20/2020   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   Carpal tunnel syndrome, left 07/26/2019   Cubital tunnel syndrome on left 07/26/2019   Umbilical hernia without obstruction and without gangrene 06/17/2019   Lumbar spondylosis 06/02/2019   Obesity (BMI 35.0-39.9 without comorbidity) 06/02/2019   Goals of care, counseling/discussion 05/18/2019   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   Fibromyalgia 03/23/2019   Systemic lupus erythematosus (SLE) in adult [facility]) 03/23/2019   Essential hypertension 03/23/2019   Mass of left kidney 03/23/2019   Nausea without vomiting 03/23/2019   Hyperlipidemia 03/23/2019   Osteoarthritis 03/23/2019   Trigeminal neuralgia 03/23/2019   Nephrolithiasis 03/23/2019   Migraine 03/23/2019   OSA on CPAP 03/23/2019   Fibrocystic breast 03/23/2019   Heart murmur 03/23/2019   Major depressive disorder,  recurrent (HCC) 03/18/2017   Morbid obesity (HCC) 03/18/2017   Neuropathic pain 11/19/2016   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HCC) 01/13/2015   Lumbar disc disease with radiculopathy 07/26/2013   S/P liver transplant (HCC) 07/26/2013   Facial nerve disorder 06/29/2012   Low back pain 12/26/2010   Conditions to be addressed/monitored per PCP order:  Chronic healthcare management needs, anxiety/depression/BP/PTSD, OSA, migraines, DM, HTN, chronic pain, SLE, LDD, HLD, fibromyalgia, osteoarthritis, LBP, DLBCL, h/o liver transplant, lymphoma, CKD-3  Care Plan : Chronic Pain (Adult)  Updates made by [name], RN since 04/29/2023 12:00 AM     Problem: Chronic Pain Management-fibromyalgia   Priority: High  Onset Date: 01/22/2021     Long-Range Goal: Fibromyalgia pain managed-new pain management provider   Start Date: 08/14/2020  Expected End Date: 06/25/2023  Recent Progress: On track  Priority: High  Note:   Current Barriers:  Knowledge deficits related to pain management  04/29/23:  patient awaiting auth for pain med-Dr. Welton Flakes prescribed Oxycodone until approval by insurance.  Nurse Case Manager Clinical Goal(s):  Over the next 45 days, patient will work with [facility] to address needs related to referral for pain management  provider and associated care coordination needs. 05/24/22:  patient is currently seeing Dr. Welton Flakes at [facility].  Interventions:  Inter-disciplinary care team collaboration (see longitudinal plan of care) Evaluation of current treatment plan related to fibromyalgia  and patient's adherence to plan as established by provider. Update 06/15/21:  Patient taking Baclofen again, Effexor added. Collaborated with primary care provider regarding recommendations and referral to pain management provider. Discussed plans with patient for ongoing care management follow up and provided patient with direct contact information for care management  team Anticipate pain education program, pain management support as part of pain management referral.       Patient Goals/Self-Care Activities Over the next 45 days, patient will:  -Attends all scheduled provider appointments  develop a personal pain management plan with your pain management provider. Patient will f/u with Dr. Welton Flakes and insurance regarding auth.  Follow Up Plan:  The Managed Medicaid care management team will reach out to the patient again over the next 45 business days.    Follow Up:  Patient agrees to Care Plan and Follow-up.  Plan: The Managed Medicaid care management team will reach out to the patient again over the next 45 business  days. and The  Patient has been provided with contact information for the Managed Medicaid care management team and has been advised to call with any health related questions or concerns.  Date/time of next scheduled RN care management/care coordination outreach:06/05/23 at 1230

## 2023-05-07 DIAGNOSIS — F411 Generalized anxiety disorder: Secondary | ICD-10-CM | POA: Diagnosis not present

## 2023-05-07 DIAGNOSIS — F4310 Post-traumatic stress disorder, unspecified: Secondary | ICD-10-CM | POA: Diagnosis not present

## 2023-05-07 DIAGNOSIS — F3181 Bipolar II disorder: Secondary | ICD-10-CM | POA: Diagnosis not present

## 2023-05-19 DIAGNOSIS — M79603 Pain in arm, unspecified: Secondary | ICD-10-CM | POA: Diagnosis not present

## 2023-05-19 DIAGNOSIS — M5442 Lumbago with sciatica, left side: Secondary | ICD-10-CM | POA: Diagnosis not present

## 2023-05-19 DIAGNOSIS — Z79891 Long term (current) use of opiate analgesic: Secondary | ICD-10-CM | POA: Diagnosis not present

## 2023-05-19 DIAGNOSIS — M5414 Radiculopathy, thoracic region: Secondary | ICD-10-CM | POA: Diagnosis not present

## 2023-05-19 DIAGNOSIS — M79606 Pain in leg, unspecified: Secondary | ICD-10-CM | POA: Diagnosis not present

## 2023-05-19 DIAGNOSIS — M79669 Pain in unspecified lower leg: Secondary | ICD-10-CM | POA: Diagnosis not present

## 2023-05-19 DIAGNOSIS — F3189 Other bipolar disorder: Secondary | ICD-10-CM | POA: Diagnosis not present

## 2023-05-19 DIAGNOSIS — G893 Neoplasm related pain (acute) (chronic): Secondary | ICD-10-CM | POA: Diagnosis not present

## 2023-05-19 DIAGNOSIS — M25519 Pain in unspecified shoulder: Secondary | ICD-10-CM | POA: Diagnosis not present

## 2023-05-19 DIAGNOSIS — G894 Chronic pain syndrome: Secondary | ICD-10-CM | POA: Diagnosis not present

## 2023-05-19 DIAGNOSIS — I10 Essential (primary) hypertension: Secondary | ICD-10-CM | POA: Diagnosis not present

## 2023-05-22 ENCOUNTER — Other Ambulatory Visit: Payer: Self-pay | Admitting: Nurse Practitioner

## 2023-05-22 ENCOUNTER — Encounter: Payer: Self-pay | Admitting: Nurse Practitioner

## 2023-05-22 DIAGNOSIS — E1165 Type 2 diabetes mellitus with hyperglycemia: Secondary | ICD-10-CM

## 2023-05-22 MED ORDER — INSULIN PEN NEEDLE 32G X 6 MM MISC
1.0000 | 5 refills | Status: DC | PRN
Start: 2023-05-22 — End: 2024-05-03

## 2023-06-01 ENCOUNTER — Other Ambulatory Visit: Payer: Self-pay | Admitting: Psychiatry

## 2023-06-01 DIAGNOSIS — F3176 Bipolar disorder, in full remission, most recent episode depressed: Secondary | ICD-10-CM

## 2023-06-01 DIAGNOSIS — F411 Generalized anxiety disorder: Secondary | ICD-10-CM

## 2023-06-01 DIAGNOSIS — F4310 Post-traumatic stress disorder, unspecified: Secondary | ICD-10-CM

## 2023-06-02 ENCOUNTER — Other Ambulatory Visit: Payer: Self-pay | Admitting: *Deleted

## 2023-06-02 DIAGNOSIS — N1831 Chronic kidney disease, stage 3a: Secondary | ICD-10-CM

## 2023-06-02 DIAGNOSIS — D696 Thrombocytopenia, unspecified: Secondary | ICD-10-CM

## 2023-06-02 DIAGNOSIS — C8580 Other specified types of non-Hodgkin lymphoma, unspecified site: Secondary | ICD-10-CM

## 2023-06-03 ENCOUNTER — Other Ambulatory Visit: Payer: Self-pay | Admitting: Psychiatry

## 2023-06-03 ENCOUNTER — Inpatient Hospital Stay: Payer: Medicaid Other | Attending: Oncology

## 2023-06-03 ENCOUNTER — Encounter: Payer: Self-pay | Admitting: Oncology

## 2023-06-03 ENCOUNTER — Inpatient Hospital Stay (HOSPITAL_BASED_OUTPATIENT_CLINIC_OR_DEPARTMENT_OTHER): Payer: Medicaid Other | Admitting: Oncology

## 2023-06-03 VITALS — BP 125/93 | HR 71 | Temp 98.3°F | Resp 18 | Ht 66.0 in | Wt 250.4 lb

## 2023-06-03 DIAGNOSIS — Z8572 Personal history of non-Hodgkin lymphomas: Secondary | ICD-10-CM

## 2023-06-03 DIAGNOSIS — F4310 Post-traumatic stress disorder, unspecified: Secondary | ICD-10-CM

## 2023-06-03 DIAGNOSIS — C8580 Other specified types of non-Hodgkin lymphoma, unspecified site: Secondary | ICD-10-CM

## 2023-06-03 DIAGNOSIS — Z08 Encounter for follow-up examination after completed treatment for malignant neoplasm: Secondary | ICD-10-CM

## 2023-06-03 DIAGNOSIS — F411 Generalized anxiety disorder: Secondary | ICD-10-CM

## 2023-06-03 DIAGNOSIS — I129 Hypertensive chronic kidney disease with stage 1 through stage 4 chronic kidney disease, or unspecified chronic kidney disease: Secondary | ICD-10-CM | POA: Diagnosis not present

## 2023-06-03 DIAGNOSIS — N183 Chronic kidney disease, stage 3 unspecified: Secondary | ICD-10-CM | POA: Diagnosis not present

## 2023-06-03 DIAGNOSIS — N1831 Chronic kidney disease, stage 3a: Secondary | ICD-10-CM

## 2023-06-03 DIAGNOSIS — Z95828 Presence of other vascular implants and grafts: Secondary | ICD-10-CM

## 2023-06-03 DIAGNOSIS — Z944 Liver transplant status: Secondary | ICD-10-CM | POA: Insufficient documentation

## 2023-06-03 DIAGNOSIS — D696 Thrombocytopenia, unspecified: Secondary | ICD-10-CM | POA: Diagnosis not present

## 2023-06-03 DIAGNOSIS — F3176 Bipolar disorder, in full remission, most recent episode depressed: Secondary | ICD-10-CM

## 2023-06-03 LAB — COMPREHENSIVE METABOLIC PANEL
ALT: 27 U/L (ref 0–44)
AST: 40 U/L (ref 15–41)
Albumin: 4 g/dL (ref 3.5–5.0)
Alkaline Phosphatase: 80 U/L (ref 38–126)
Anion gap: 6 (ref 5–15)
BUN: 20 mg/dL (ref 6–20)
CO2: 25 mmol/L (ref 22–32)
Calcium: 9.2 mg/dL (ref 8.9–10.3)
Chloride: 103 mmol/L (ref 98–111)
Creatinine, Ser: 0.93 mg/dL (ref 0.44–1.00)
GFR, Estimated: >60 mL/min (ref >60)
Glucose, Bld: 231 mg/dL — ABNORMAL HIGH (ref 70–99)
Potassium: 4.7 mmol/L (ref 3.5–5.1)
Sodium: 134 mmol/L — ABNORMAL LOW (ref 135–145)
Total Bilirubin: 0.7 mg/dL (ref 0.3–1.2)
Total Protein: 7.3 g/dL (ref 6.5–8.1)

## 2023-06-03 LAB — CBC WITH DIFFERENTIAL/PLATELET
Abs Immature Granulocytes: 0.03 10*3/uL (ref 0.00–0.07)
Basophils Absolute: 0.1 10*3/uL (ref 0.0–0.1)
Basophils Relative: 1 %
Eosinophils Absolute: 0.5 10*3/uL (ref 0.0–0.5)
Eosinophils Relative: 7 %
HCT: 39.3 % (ref 36.0–46.0)
Hemoglobin: 12.9 g/dL (ref 12.0–15.0)
Immature Granulocytes: 0 %
Lymphocytes Relative: 35 %
Lymphs Abs: 2.4 10*3/uL (ref 0.7–4.0)
MCH: 28.5 pg (ref 26.0–34.0)
MCHC: 32.8 g/dL (ref 30.0–36.0)
MCV: 86.8 fL (ref 80.0–100.0)
Monocytes Absolute: 0.5 10*3/uL (ref 0.1–1.0)
Monocytes Relative: 8 %
Neutro Abs: 3.5 10*3/uL (ref 1.7–7.7)
Neutrophils Relative %: 49 %
Platelets: 147 10*3/uL — ABNORMAL LOW (ref 150–400)
RBC: 4.53 MIL/uL (ref 3.87–5.11)
RDW: 14.2 % (ref 11.5–15.5)
WBC: 7 10*3/uL (ref 4.0–10.5)
nRBC: 0 % (ref 0.0–0.2)

## 2023-06-03 LAB — LACTATE DEHYDROGENASE: LDH: 155 U/L (ref 98–192)

## 2023-06-03 MED ORDER — SODIUM CHLORIDE 0.9% FLUSH
10.0000 mL | Freq: Once | INTRAVENOUS | Status: AC
  Administered 2023-06-03: 10 mL via INTRAVENOUS
  Filled 2023-06-03: qty 10

## 2023-06-03 MED ORDER — HEPARIN SOD (PORK) LOCK FLUSH 100 UNIT/ML IV SOLN
500.0000 [IU] | Freq: Once | INTRAVENOUS | Status: AC
  Administered 2023-06-03: 500 [IU] via INTRAVENOUS
  Filled 2023-06-03: qty 5

## 2023-06-03 NOTE — Progress Notes (Signed)
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  She is currently on CellCept and tacrolimus for the same.  She also has a history of post transplant lymphoproliferative disorder/DLBCL that was diagnosed in 2016.  She is s/p 6 cycles of R-CHOP chemotherapy back then and was in complete remission 1.  Her other past medical history significant for migraines, stage III chronic kidney disease, chemo-induced peripheral neuropathy, hypertension among other medical problems.  With regards to her diffuse large B-cell lymphoma she was getting surveillance scans and this was all done in [place].  She has now moved to [facility] to be with her significant other.  Patient was noted to have a prior kidney mass before which was biopsied and was not consistent with malignancy.   She underwent CT neck with contrast before she left [place] on 02/22/2019.  She was noted to have soft tissue masses in both the parotid glands which were new as compared to prior exams and possibly represent lymph nodes.  The largest one was seen in the left parotid gland measuring  1.3 x 1.1 cm.  Before she could get a work-up for this patient moved to [place] and has not had any further work-up yet   Her other prior imaging is as follows: MRI thoracic spine without contrast in August 2019 showed moderate multilevel spondylosis but no evidence of lymphoma.  Multinodular thyroid in June 2019.  MRI cervical spine May 2019 again showed spondylosis but no other acute pathology.  MRI brain showed incidental partial opacification of the right and right sinus.  No evidence of malignancy.  I do not have any other PET CT scan or treatment records from the past.   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  Bone marrow biopsy was negative for lymphoma.  Patient completed 4 cycles of weekly RituxanIn September 2020.  Repeat PET CT scan showed interval resolution of the parotid lesions compatible with complete response to therapy  Interval history-patient was admitted to the hospital in May 2024 for episode of pericarditis.  He had a CT chest abdomen and pelvis done at that time which did not show any evidence of recurrent lymphoma.  She reports chronic fatigue presently.  Denies any changes in her appetite or weight.  ECOG PS- 1 Pain scale- 0   Review of systems- Review of Systems  Constitutional:  Positive for malaise/fatigue. Negative for chills, fever and weight loss.  HENT:  Negative for congestion, ear discharge and nosebleeds.   Eyes:  Negative for blurred vision.  Respiratory:  Negative for cough, hemoptysis, sputum production, shortness of breath and wheezing.   Cardiovascular:  Negative for chest pain, palpitations, orthopnea and claudication.  Gastrointestinal:  Negative  for abdominal pain, blood in stool, constipation, diarrhea, heartburn, melena, nausea and vomiting.  Genitourinary:  Negative for dysuria, flank pain, frequency, hematuria and urgency.  Musculoskeletal:  Negative for back pain, joint pain and myalgias.  Skin:   Negative for rash.  Neurological:  Negative for dizziness, tingling, focal weakness, seizures, weakness and headaches.  Endo/Heme/Allergies:  Does not bruise/bleed easily.  Psychiatric/Behavioral:  Negative for depression and suicidal ideas. The patient does not have insomnia.       Allergies  Allergen Reactions   Penicillin G Itching, Rash, Anaphylaxis and Palpitations   Lamictal [Lamotrigine] Rash   Carbamazepine Other (See Comments)    Medication interaction-prograf    Hydrocodone-Acetaminophen Itching   Naproxen Itching   Penicillins    Vancomycin Rash    Macular/blotchy rash at infusion site     Past Medical History:  Diagnosis Date   Abnormal uterine bleeding    Allergy    Anxiety    Arthritis    Bipolar disorder (manic depression) (HCC)    Chronic kidney failure    Chronic renal disease, stage III (HCC)    Diabetes mellitus without complication (HCC)    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HCC) 2015   Right axillary lymph node resected and chemo tx's.   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   Chemo tx's   Migraine    Morbid obesity (HCC)    Neuromuscular disorder (HCC)    neuropathy   Neuropathy    Personality disorder (HCC)    Pineal gland cyst    Post herpetic neuralgia    PTSD (post-traumatic stress disorder)    Renal disorder    S/P liver transplant (HCC) 1993   Sleep apnea    has not recieved cpap yet-other one was recalled     Past Surgical History:  Procedure Laterality Date   BONE MARROW BIOPSY  01/13/2015   BREAST BIOPSY  12/2014   BREAST BIOPSY  2011   BREAST SURGERY     CHOLECYSTECTOMY     COLONOSCOPY     COLONOSCOPY WITH PROPOFOL N/A 02/28/2022    Procedure: COLONOSCOPY WITH PROPOFOL;  Surgeon: Midge Minium, MD;  Location: [facility] ENDOSCOPY;  Service: Endoscopy;  Laterality: N/A;   ESOPHAGOGASTRODUODENOSCOPY     ESOPHAGOGASTRODUODENOSCOPY (EGD) WITH PROPOFOL N/A 01/09/2021   Procedure: ESOPHAGOGASTRODUODENOSCOPY (EGD) WITH PROPOFOL;  Surgeon: Midge Minium, MD;  Location: [facility] ENDOSCOPY;  Service: Endoscopy;  Laterality: N/A;   HERNIA REPAIR     x4-all from liver transplant   HYSTEROSCOPY WITH D & C N/A 11/13/2021   Procedure: DILATATION AND CURETTAGE /HYSTEROSCOPY;  Surgeon: Natale Milch, MD;  Location: [facility] ORS;  Service: Gynecology;  Laterality: N/A;   infusaport     LIVER TRANSPLANT  12/17/1991   LUMBAR PUNCTURE     PORT A CATH INJECTION ([facility] HX)     RENAL BIOPSY     tumor removal  2015   cancer right side    Social History   Socioeconomic History   Marital status: Single    Spouse name: Not on file   Number of children: 0   Years  of education: 9   Highest education level: GED or equivalent  Occupational History   Occupation: disabled  Tobacco Use   Smoking status: Never   Smokeless tobacco: Never  Vaping Use   Vaping status: Never Used  Substance and Sexual Activity   Alcohol use: Not Currently   Drug use: Not Currently    Comment: pain managment last used in early april   Sexual activity: Not Currently  Other Topics Concern   Not on file  Social History Narrative   Not on file   Social Determinants of Health   Financial Resource Strain: Low Risk  (03/25/2023)   Overall Financial Resource Strain (CARDIA)    Difficulty of Paying Living Expenses: Not very hard  Food Insecurity: No Food Insecurity (03/06/2023)   Hunger Vital Sign    Worried About Running Out of Food in the Last Year: Never true    Ran Out of Food in the Last Year: Never true  Transportation Needs: No Transportation Needs (03/06/2023)   PRAPARE - Administrator, Civil Service (Medical): No    Lack of Transportation  (Non-Medical): No  Physical Activity: Insufficiently Active (03/25/2023)   Exercise Vital Sign    Days of Exercise per Week: 3 days    Minutes of Exercise per Session: 10 min  Stress: Stress Concern Present (04/29/2023)   [employer] of Occupational Health - Occupational Stress Questionnaire    Feeling of Stress : To some extent  Social Connections: Moderately Integrated (02/19/2023)   Social Connection and Isolation Panel [NHANES]    Frequency of Communication with Friends and Family: More than three times a week    Frequency of Social Gatherings with Friends and Family: More than three times a week    Attends Religious Services: More than 4 times per year    Active Member of [employer] or Organizations: No    Attends Banker Meetings: Never    Marital Status: Living with partner  Intimate Partner Violence: Not At Risk (03/06/2023)   Humiliation, Afraid, Rape, and Kick questionnaire    Fear of Current or Ex-Partner: No    Emotionally Abused: No    Physically Abused: No    Sexually Abused: No    Family History  Problem Relation Age of Onset   Heart disease Mother    Hypertension Mother    Cancer - Other Mother    Bipolar disorder Mother    Heart disease Father    Hypertension Father    Diabetes Father    Parkinson's disease Maternal Grandmother    Cancer Maternal Aunt    Cancer Maternal Uncle    Cancer Maternal Grandfather    Lupus Paternal Grandmother    Hypertension Brother      Current Outpatient Medications:    baclofen (LIORESAL) 10 MG tablet, Take 1 tablet by mouth 2 (two) times daily., Disp: , Rfl:    benztropine (COGENTIN) 1 MG tablet, TAKE 1 TABLET BY MOUTH ONCE DAILY AS NEEDED FOR TREMORS, Disp: 30 tablet, Rfl: 1   Cholecalciferol (VITAMIN D) 125 MCG (5000 UT) CAPS, Take 5,000 Units by mouth daily., Disp: , Rfl:    colchicine 0.6 MG tablet, Take 1 tablet (0.6 mg total) by mouth 2 (two) times daily., Disp: 120 tablet, Rfl: 0   dronabinol (MARINOL) 2.5  MG capsule, Take 2.5 mg by mouth See admin instructions. Takes every other day, Disp: , Rfl:    Dulaglutide 4.5 MG/0.5ML SOPN, Inject 4.5 mg into the skin once a  week., Disp: , Rfl:    ezetimibe (ZETIA) 10 MG tablet, Take 10 mg by mouth every morning., Disp: , Rfl:    ibuprofen (ADVIL) 400 MG tablet, Take 1 tablet (400 mg total) by mouth every 6 (six) hours as needed., Disp: 30 tablet, Rfl: 0   icosapent Ethyl (VASCEPA) 1 g capsule, Take 2 capsules (2 g total) by mouth 2 (two) times daily., Disp: 120 capsule, Rfl: 5   Insulin Degludec (TRESIBA) 100 UNIT/ML SOLN, Inject 30 Units into the skin., Disp: , Rfl:    Insulin Glargine (BASAGLAR KWIKPEN) 100 UNIT/ML, Inject 40 Units into the skin at bedtime., Disp: , Rfl:    Insulin Pen Needle 32G X 6 MM MISC, 1 each by Does not apply route as needed., Disp: 100 each, Rfl: 5   lisinopril (ZESTRIL) 20 MG tablet, TAKE ONE TABLET BY MOUTH ONCE DAILY, Disp: 90 tablet, Rfl: 3   lurasidone (LATUDA) 20 MG TABS tablet, Take 1 tablet (20 mg total) by mouth daily with supper., Disp: 90 tablet, Rfl: 0   magnesium oxide (MAG-OX) 400 MG tablet, Take by mouth., Disp: , Rfl:    Melatonin 10 MG TABS, Take 10 mg by mouth at bedtime., Disp: , Rfl:    meloxicam (MOBIC) 15 MG tablet, Take 1 tablet (15 mg total) by mouth daily., Disp: 30 tablet, Rfl: 0   metFORMIN (GLUCOPHAGE) 1000 MG tablet, Take 1 tablet (1,000 mg total) by mouth 2 (two) times daily with a meal., Disp: 180 tablet, Rfl: 3   methocarbamol (ROBAXIN) 500 MG tablet, Take 1 tablet (500 mg total) by mouth 4 (four) times daily., Disp: 16 tablet, Rfl: 0   mirabegron ER (MYRBETRIQ) 50 MG TB24 tablet, Take 1 tablet (50 mg total) by mouth daily., Disp: 90 tablet, Rfl: 3   mirtazapine (REMERON) 15 MG tablet, Take 1 tablet (15 mg total) by mouth at bedtime. TAKE ONE TABLET BY MOUTH EVERYDAY AT BEDTIME, Disp: 90 tablet, Rfl: 0   naloxone (NARCAN) nasal spray 4 mg/0.1 mL, Place 0.4 mg into the nose once., Disp: , Rfl:     NOVOLOG FLEXPEN 100 UNIT/ML FlexPen, Inject 0-30 Units into the skin 3 (three) times daily with meals. Sliding scale, Disp: , Rfl:    PRECISION QID TEST test strip, , Disp: , Rfl:    pregabalin (LYRICA) 200 MG capsule, Take 1 capsule (200 mg total) by mouth 2 (two) times daily., Disp: 60 capsule, Rfl: 0   rosuvastatin (CRESTOR) 10 MG tablet, Take 1 tablet (10 mg total) by mouth daily., Disp: 90 tablet, Rfl: 2   tacrolimus (PROGRAF) 1 MG capsule, Take 1 capsule by mouth every morning., Disp: , Rfl:    UBRELVY 50 MG TABS, Take 1 tablet by mouth daily as needed., Disp: , Rfl:    venlafaxine XR (EFFEXOR XR) 37.5 MG 24 hr capsule, Take 1 capsule (37.5 mg total) by mouth daily with breakfast. Take along with 75 mg daily, Disp: 90 capsule, Rfl: 0   venlafaxine XR (EFFEXOR XR) 75 MG 24 hr capsule, Take 1 capsule (75 mg total) by mouth daily with breakfast., Disp: 90 capsule, Rfl: 0   XTAMPZA ER 9 MG C12A, Take 1 capsule by mouth every 12 (twelve) hours., Disp: , Rfl:   Physical exam:  Vitals:   06/03/23 1324 06/03/23 1327  BP: (!) 127/100 (!) 125/93  Pulse: 74 71  Resp: 18   Temp: 98.3 F (36.8 C)   TempSrc: Tympanic   Weight: 250 lb 6.4 oz (113.6 kg)  Height: 5\' 6"  (1.676 m)    Physical Exam Cardiovascular:     Rate and Rhythm: Normal rate and regular rhythm.     Heart sounds: Normal heart sounds.  Pulmonary:     Effort: Pulmonary effort is normal.     Breath sounds: Normal breath sounds.  Abdominal:     General: Bowel sounds are normal.     Palpations: Abdomen is soft.  Lymphadenopathy:     Comments: No palpable cervical, supraclavicular, axillary or inguinal adenopathy    Skin:    General: Skin is warm and dry.  Neurological:     Mental Status: She is alert and oriented to person, place, and time.         Latest Ref Rng & Units 06/03/2023    1:05 PM  CMP  Glucose 70 - 99 mg/dL 191   BUN 6 - 20 mg/dL 20   Creatinine 4.78 - 1.00 mg/dL 2.95   Sodium 621 - 308 mmol/L 134    Potassium 3.5 - 5.1 mmol/L 4.7   Chloride 98 - 111 mmol/L 103   CO2 22 - 32 mmol/L 25   Calcium 8.9 - 10.3 mg/dL 9.2   Total Protein 6.5 - 8.1 g/dL 7.3   Total Bilirubin 0.3 - 1.2 mg/dL 0.7   Alkaline Phos 38 - 126 U/L 80   AST 15 - 41 U/L 40   ALT 0 - 44 U/L 27       Latest Ref Rng & Units 06/03/2023    1:05 PM  CBC  WBC 4.0 - 10.5 K/uL 7.0   Hemoglobin 12.0 - 15.0 g/dL 65.7   Hematocrit 84.6 - 46.0 % 39.3   Platelets 150 - 400 K/uL 147     Assessment and plan- Patient is a 47 y.o. 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  She is here for routine follow-up of marginal zone lymphoma  CT scans in May 2024 did not reveal any evidence of recurrent or progressive lymphoma.  Clinically she is doing well with no concerning signs and symptoms of recurrence based on today's exam.  She has mild chronic thrombocytopenia which is overall stable.  I will see her back in 6 months with labs   Visit Diagnosis 1. Encounter for follow-up surveillance of lymphoma      Dr. Owens Shark, MD, MPH [facility] [phone] 06/03/2023 2:37 PM

## 2023-06-04 ENCOUNTER — Ambulatory Visit: Payer: Medicaid Other | Attending: Nurse Practitioner | Admitting: Nurse Practitioner

## 2023-06-04 ENCOUNTER — Encounter: Payer: Self-pay | Admitting: Nurse Practitioner

## 2023-06-04 VITALS — BP 124/92 | HR 83 | Ht 66.0 in | Wt 252.8 lb

## 2023-06-04 DIAGNOSIS — R002 Palpitations: Secondary | ICD-10-CM

## 2023-06-04 DIAGNOSIS — I10 Essential (primary) hypertension: Secondary | ICD-10-CM | POA: Diagnosis not present

## 2023-06-04 DIAGNOSIS — E782 Mixed hyperlipidemia: Secondary | ICD-10-CM | POA: Diagnosis not present

## 2023-06-04 DIAGNOSIS — I319 Disease of pericardium, unspecified: Secondary | ICD-10-CM

## 2023-06-04 DIAGNOSIS — F411 Generalized anxiety disorder: Secondary | ICD-10-CM | POA: Diagnosis not present

## 2023-06-04 DIAGNOSIS — F3181 Bipolar II disorder: Secondary | ICD-10-CM | POA: Diagnosis not present

## 2023-06-04 DIAGNOSIS — F4310 Post-traumatic stress disorder, unspecified: Secondary | ICD-10-CM | POA: Diagnosis not present

## 2023-06-04 NOTE — Progress Notes (Signed)
Office Visit    Patient Name: Alison [name] Date of Encounter: 06/04/2023  Primary Care Provider:  [name], FNP Primary Cardiologist:  Debbe Odea, MD  Chief Complaint    47 y.o. 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  Past Medical History    Past Medical History:  Diagnosis Date   Abnormal uterine bleeding    Allergy    Anxiety    Arthritis    Bipolar disorder (manic depression) (HCC)    Chronic kidney failure    Chronic renal disease, stage III (HCC)    Diabetes mellitus without complication (HCC)    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HCC) 2015   Right axillary lymph node resected and chemo tx's.   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   Chemo tx's   Migraine    Morbid obesity (HCC)    Neuromuscular disorder (HCC)    neuropathy   Neuropathy    Pericarditis    a. 02/2023 Echo: EF 55-60%, no rwma, nl RV size/fxn. Mildly dil LA.   Personality disorder (HCC)    Pineal gland cyst    Post herpetic neuralgia    PTSD (post-traumatic stress disorder)    Renal disorder    S/P liver transplant (HCC) 1993   Sleep apnea    has not recieved cpap yet-other one was recalled   Past Surgical History:  Procedure Laterality Date   BONE MARROW BIOPSY  01/13/2015    BREAST BIOPSY  12/2014   BREAST BIOPSY  2011   BREAST SURGERY     CHOLECYSTECTOMY     COLONOSCOPY     COLONOSCOPY WITH PROPOFOL N/A 02/28/2022   Procedure: COLONOSCOPY WITH PROPOFOL;  Surgeon: Midge Minium, MD;  Location: [facility];  Service: Endoscopy;  Laterality: N/A;   ESOPHAGOGASTRODUODENOSCOPY     ESOPHAGOGASTRODUODENOSCOPY (EGD) WITH PROPOFOL N/A 01/09/2021   Procedure: ESOPHAGOGASTRODUODENOSCOPY (EGD) WITH PROPOFOL;  Surgeon: Midge Minium, MD;  Location: [facility];  Service: Endoscopy;  Laterality: N/A;   HERNIA REPAIR     x4-all from liver transplant   HYSTEROSCOPY WITH D & C N/A 11/13/2021   Procedure: DILATATION AND CURETTAGE /HYSTEROSCOPY;  Surgeon: Natale Milch, MD;  Location: [facility] ORS;  Service: Gynecology;  Laterality: N/A;   infusaport     LIVER TRANSPLANT  12/17/1991   LUMBAR PUNCTURE     PORT A CATH INJECTION ([facility])     RENAL BIOPSY     tumor removal  2015   cancer right side    Allergies  Allergies  Allergen Reactions   Penicillin G Itching, Rash, Anaphylaxis and Palpitations   Lamictal [Lamotrigine] Rash   Carbamazepine Other (See Comments)    Medication interaction-prograf    Hydrocodone-Acetaminophen Itching   Naproxen Itching   Penicillins  Vancomycin Rash    Macular/blotchy rash at infusion site    History of Present Illness      47 y.o. 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  She was admitted to [facility] in May 2024 in the setting of pleuritic chest pain and sepsis without source of infection.  CT of the chest, abdomen and pelvis, showed a small  pericardial effusion.  Echocardiogram was performed and showed normal LV and RV function without significant effusion or valvular heart disease.  She was placed on colchicine and ibuprofen with improvement in chest pain and was subsequently discharged on broad-spectrum antibiotics.   At office follow-up in June 2024, [name] reported resolution of pleuritic chest pain and pain while lying down.  She was noting intermittent palpitations a few times per week.  A ZIO monitor was placed and showed predominantly sinus rhythm with 5 brief runs of SVT up to 13 beats and rare PACs and PVCs without sustained arrhythmias.  Since her last visit, she has felt generally well.  She notes that palpitations were fairly infrequent and brief, and she prefers conservative management.  She is walking some with a friend and tolerating it well.  She has not been experiencing chest pain is no longer taking colchicine.  She has noted mild dyspnea as soon as she gets up from a chair and starts to do something but this resolves fairly quickly.  She denies PND, orthopnea, dizziness, syncope, edema, or early satiety.  Home Medications    Current Outpatient Medications  Medication Sig Dispense Refill   baclofen (LIORESAL) 10 MG tablet Take 1 tablet by mouth 2 (two) times daily.     benztropine (COGENTIN) 1 MG tablet TAKE 1 TABLET BY MOUTH ONCE DAILY AS NEEDED FOR TREMORS 30 tablet 1   Cholecalciferol (VITAMIN D) 125 MCG (5000 UT) CAPS Take 5,000 Units by mouth daily.     Dulaglutide 4.5 MG/0.5ML SOPN Inject 4.5 mg into the skin once a week.     ezetimibe (ZETIA) 10 MG tablet Take 10 mg by mouth every morning.     ibuprofen (ADVIL) 400 MG tablet Take 1 tablet (400 mg total) by mouth every 6 (six) hours as needed. 30 tablet 0   icosapent Ethyl (VASCEPA) 1 g capsule Take 2 capsules (2 g total) by mouth 2 (two) times daily. 120 capsule 5   Insulin Degludec (TRESIBA) 100 UNIT/ML SOLN Inject 30 Units into the skin.     Insulin  Glargine (BASAGLAR KWIKPEN) 100 UNIT/ML Inject 40 Units into the skin at bedtime.     Insulin Pen Needle 32G X 6 MM MISC 1 each by Does not apply route as needed. 100 each 5   lisinopril (ZESTRIL) 20 MG tablet TAKE ONE TABLET BY MOUTH ONCE DAILY 90 tablet 3   lurasidone (LATUDA) 20 MG TABS tablet TAKE 1 TABLET BY MOUTH ONCE DAILY WITH SUPPER *REFILL REQUEST* 90 tablet 10   magnesium oxide (MAG-OX) 400 MG tablet Take by mouth.     Melatonin 10 MG TABS Take 10 mg by mouth at bedtime.     meloxicam (MOBIC) 15 MG tablet Take 1 tablet (15 mg total) by mouth daily. 30 tablet 0   metFORMIN (  GLUCOPHAGE) 1000 MG tablet Take 1 tablet (1,000 mg total) by mouth 2 (two) times daily with a meal. 180 tablet 3   methocarbamol (ROBAXIN) 500 MG tablet Take 1 tablet (500 mg total) by mouth 4 (four) times daily. 16 tablet 0   mirabegron ER (MYRBETRIQ) 50 MG TB24 tablet Take 1 tablet (50 mg total) by mouth daily. 90 tablet 3   mirtazapine (REMERON) 15 MG tablet TAKE 1 TABLET BY MOUTH EVERY DAY AT BEDTIME *REFILL REQUEST* 90 tablet 10   naloxone (NARCAN) nasal spray 4 mg/0.1 mL Place 0.4 mg into the nose once.     NOVOLOG FLEXPEN 100 UNIT/ML FlexPen Inject 0-30 Units into the skin 3 (three) times daily with meals. Sliding scale     oxyCODONE (OXY IR/ROXICODONE) 5 MG immediate release tablet Take 5 mg by mouth 3 (three) times daily as needed.     PRECISION QID TEST test strip      pregabalin (LYRICA) 200 MG capsule Take 1 capsule (200 mg total) by mouth 2 (two) times daily. 60 capsule 0   rosuvastatin (CRESTOR) 10 MG tablet Take 1 tablet (10 mg total) by mouth daily. 90 tablet 2   tacrolimus (PROGRAF) 1 MG capsule Take 1 capsule by mouth every morning.     UBRELVY 50 MG TABS Take 1 tablet by mouth daily as needed.     venlafaxine XR (EFFEXOR-XR) 37.5 MG 24 hr capsule TAKE 1 CAPSULE BY MOUTH DAILY WITH BREAKFAST ALONG WITH 75MG  DAILY *REFILL REQUEST* 90 capsule 10   venlafaxine XR (EFFEXOR-XR) 75 MG 24 hr capsule TAKE 1  CAPSULE BY MOUTH ONCE DAILY WITH BREAKFAST ALONG WITH 37.5MG  TABLET *REFILL REQUEST* 90 capsule 10   dronabinol (MARINOL) 2.5 MG capsule Take 2.5 mg by mouth See admin instructions. Takes every other day (Patient not taking: Reported on 06/04/2023)     XTAMPZA ER 9 MG C12A Take 1 capsule by mouth every 12 (twelve) hours. (Patient not taking: Reported on 06/04/2023)     No current facility-administered medications for this visit.     Review of Systems    Occasional palpitation though generally brief.  Occasional dyspnea if she stands up quickly and starts off to do something but this resolves quickly.  Has been tolerating her regular walking/exercise.  She denies chest pain, dyspnea, PND, orthopnea, dizziness, syncope, edema, or early satiety.  All other systems reviewed and are otherwise negative except as noted above.    Physical Exam    VS:  BP (!) 124/92 (BP Location: Left Arm, Patient Position: Sitting, Cuff Size: Large)   Pulse 83   Ht 5\' 6"  (1.676 m)   Wt 252 lb 12.8 oz (114.7 kg)   SpO2 92%   BMI 40.80 kg/m  , BMI Body mass index is 40.8 kg/m.    Orthostatic VS for the past 24 hrs:  BP- Lying Pulse- Lying BP- Sitting Pulse- Sitting BP- Standing at 0 minutes Pulse- Standing at 0 minutes  06/04/23 1531 117/81 84 118/83 87 116/81 92    GEN: Well nourished, well developed, in no acute distress. HEENT: normal. Neck: Supple, obese, difficult to gauge JVP.  No bruits.   Cardiac: RRR, no murmurs, rubs, or gallops. No clubbing, cyanosis, edema.  Radials 2+/PT 2+ and equal bilaterally.  Respiratory:  Respirations regular and unlabored, clear to auscultation bilaterally. GI: Soft, nontender, nondistended, BS + x 4. MS: no deformity or atrophy. Skin: warm and dry, no rash. Neuro:  Strength and sensation are intact. Psych: Normal affect.  Accessory Clinical Findings    ECG personally reviewed by me today - EKG Interpretation Date/Time:  Wednesday June 04 2023 15:29:46  EDT Ventricular Rate:  83 PR Interval:  138 QRS Duration:  154 QT Interval:  416 QTC Calculation: 488 R Axis:   -33  Text Interpretation: Normal sinus rhythm Left axis deviation Right bundle branch block When compared with ECG of 06-Mar-2023 07:36, No significant change was found Confirmed by Nicolasa Ducking [phone]) on 06/04/2023 3:45:31 PM  - no acute changes.  Lab Results  Component Value Date   WBC 7.0 06/03/2023   HGB 12.9 06/03/2023   HCT 39.3 06/03/2023   MCV 86.8 06/03/2023   PLT 147 (L) 06/03/2023   Lab Results  Component Value Date   CREATININE 0.93 06/03/2023   BUN 20 06/03/2023   NA 134 (L) 06/03/2023   K 4.7 06/03/2023   CL 103 06/03/2023   CO2 25 06/03/2023   Lab Results  Component Value Date   ALT 27 06/03/2023   AST 40 06/03/2023   ALKPHOS 80 06/03/2023   BILITOT 0.7 06/03/2023   Lab Results  Component Value Date   CHOL 66 03/08/2023   HDL 30 (L) 03/08/2023   LDLCALC 19 03/08/2023   TRIG 87 03/08/2023   CHOLHDL 2.2 03/08/2023    Lab Results  Component Value Date   HGBA1C 8.8 (H) 03/07/2023    Assessment & Plan    1.  Palpitations: Patient recently wore an event monitor in the setting of palpitations.  This showed rare PACs and PVCs with 5 brief runs of PSVT.  Overall, patient notes infrequent symptoms.  We agreed to continue with conservative management plan with the understanding that if symptoms worsen, low-dose beta-blocker might be appropriate.  2.  Pericarditis: Status post hospitalization for pleuritic chest pain in late May 2024 with small pericardial effusion noted on CT and no significant findings on echo.  She has completed treatment with nonsteroidals and colchicine and has not been having any chest pain.  3.  Primary hypertension: Stable.  Continue lisinopril.  4.  Hyperlipidemia/hypertriglyceridemia: LDL of 19 with triglycerides of 87 in June on rosuvastatin, Zetia, and Vascepa.  LFTs normal at that time.  5.  Prolonged QT:  History of prolonged QT in the context of a right bundle branch block with a QRS of 154 ms by EKG earlier this month.  QT 416 with QTc of 488 today.  Overall stable.  Will need to continue to follow in the setting of multiple medications that may impair QT with goal QTc of less than 500 ms.  6.  Disposition: Follow-up in 6 months or sooner if necessary.  Nicolasa Ducking, NP 06/04/2023, 5:05 PM

## 2023-06-04 NOTE — Patient Instructions (Signed)
Medication Instructions:  Your Physician recommend you continue on your current medication as directed.    *If you need a refill on your cardiac medications before your next appointment, please call your pharmacy*  Lab Work: None If you have labs (blood work) drawn today and your tests are completely normal, you will receive your results only by: MyChart Message (if you have MyChart) OR A paper copy in the mail If you have any lab test that is abnormal or we need to change your treatment, we will call you to review the results.  Testing/Procedures: None  Follow-Up: At [facility], you and your health needs are our priority.  As part of our continuing mission to provide you with exceptional heart care, we have created designated Provider Care Teams.  These Care Teams include your primary Cardiologist (physician) and Advanced Practice Providers (APPs -  Physician Assistants and Nurse Practitioners) who all work together to provide you with the care you need, when you need it.  We recommend signing up for the patient portal called "MyChart".  Sign up information is provided on this After Visit Summary.  MyChart is used to connect with patients for Virtual Visits (Telemedicine).  Patients are able to view lab/test results, encounter notes, upcoming appointments, etc.  Non-urgent messages can be sent to your provider as well.   To learn more about what you can do with MyChart, go to [email or web address].    Your next appointment:   6 month(s)  Provider:   You may see Debbe Odea, MD or one of the following Advanced Practice Providers on your designated Care Team:

## 2023-06-05 ENCOUNTER — Encounter: Payer: Self-pay | Admitting: Obstetrics and Gynecology

## 2023-06-05 ENCOUNTER — Other Ambulatory Visit: Payer: Medicaid Other | Admitting: Obstetrics and Gynecology

## 2023-06-05 NOTE — Patient Outreach (Signed)
Medicaid Managed Care   Nurse Care Manager Note  06/05/2023 Name:  Alison [name] MRN:  [id number] DOB:  [birth year]  Alison [name] is an 47 y.o. year old female who is a primary patient of Alison Salines, FNP.  [facility] Managed Care Coordination team was consulted for assistance with:    Chronic healthcare management needs, anxiety/depression/BP/PTSD, OSD, migraines, DM, HTN, chronic pain, DLBCL, lymphoma, SLE, LBP, LDD, HLD, fibromyalgia, osteoarthritis, liver transplant , CKD, obesity, prolonged QT  Alison [name] was given information about Medicaid Managed Care Coordination team services today. Alison [name] Patient agreed to services and verbal consent obtained.  Engaged with patient by telephone for follow up visit in response to provider referral for case management and/or care coordination services.   Assessments/Interventions:  Review of past medical history, allergies, medications, health status, including review of consultants reports, laboratory and other test data, was performed as part of comprehensive evaluation and provision of chronic care management services.  SDOH (Social Determinants of Health) assessments and interventions performed: SDOH Interventions    Flowsheet Row Patient Outreach Telephone from 06/05/2023 in [place] POPULATION HEALTH DEPARTMENT Patient Outreach Telephone from 04/29/2023 in [place] POPULATION HEALTH DEPARTMENT Patient Outreach Telephone from 03/25/2023 in [place] POPULATION HEALTH DEPARTMENT Patient Outreach Telephone from 01/10/2023 in [place] POPULATION HEALTH DEPARTMENT Patient Outreach Telephone from 12/11/2022 in [place] POPULATION HEALTH DEPARTMENT Patient Outreach Telephone from 11/11/2022 in [place] POPULATION HEALTH DEPARTMENT  SDOH Interventions        Food Insecurity Interventions -- -- -- Intervention Not Indicated -- --  Housing Interventions -- -- -- -- Intervention Not Indicated --  Transportation Interventions  -- -- -- Intervention Not Indicated -- --  Utilities Interventions Intervention Not Indicated -- -- -- Intervention Not Indicated --  Alcohol Usage Interventions -- -- -- -- -- Intervention Not Indicated (Score <7)  Financial Strain Interventions -- -- Intervention Not Indicated -- -- --  Physical Activity Interventions -- -- Other (Comments)  [not able to enage in moderate to strenuous exercise at this level] -- -- --  Stress Interventions -- Provide Counseling  [followed by Psychiatry] -- -- -- Provide Counseling  [sees Psychiatrist on regular basis]  Social Connections Interventions Intervention Not Indicated -- -- -- -- --  Health Literacy Interventions -- Intervention Not Indicated -- -- -- --     Care Plan Allergies  Allergen Reactions   Penicillin G Itching, Rash, Anaphylaxis and Palpitations   Lamictal [Lamotrigine] Rash   Carbamazepine Other (See Comments)    Medication interaction-prograf    Hydrocodone-Acetaminophen Itching   Naproxen Itching   Penicillins    Vancomycin Rash    Macular/blotchy rash at infusion site   Medications Reviewed Today     Reviewed by Alison Chandler, RN (Registered Nurse) on 06/05/23 at 1242  Med List Status: <None>   Medication Order Taking? Sig Documenting Provider Last Dose Status Informant  baclofen (LIORESAL) 10 MG tablet [id number] No Take 1 tablet by mouth 2 (two) times daily. [provider] Taking Active Self, Pharmacy Records  benztropine (COGENTIN) 1 MG tablet [id number] No TAKE 1 TABLET BY MOUTH ONCE DAILY AS NEEDED FOR TREMORS Alison [name], [name], MD Taking Active   Cholecalciferol (VITAMIN D) 125 MCG (5000 UT) CAPS [id number] No Take 5,000 Units by mouth daily. [provider] Taking Active Self, Pharmacy Records  dronabinol (MARINOL) 2.5 MG capsule [id number] No Take 2.5 mg by mouth See admin instructions. Takes every other day  Patient not taking:  Reported on 06/04/2023   [provider] Not Taking Active Self,  Pharmacy Records  Alison [name] 4.5 MG/0.5ML SOPN [id number] No Inject 4.5 mg into the skin once a week. [provider] Taking Active Self, Pharmacy Records  ezetimibe (ZETIA) 10 MG tablet [id number] No Take 10 mg by mouth every morning. [provider] Taking Active Self, Pharmacy Records  ibuprofen (ADVIL) 400 MG tablet [id number] No Take 1 tablet (400 mg total) by mouth every 6 (six) hours as needed. Alison Duster, MD Taking Active   icosapent Ethyl (VASCEPA) 1 g capsule [id number] No Take 2 capsules (2 g total) by mouth 2 (two) times daily. Alison Odea, MD Taking Active Self, Pharmacy Records           Med Note Alison [name]   Tue Mar 19, 2022 10:24 AM) Taking 1 capsule twice daily per Dr. Patrecia Pace  Insulin Degludec (TRESIBA) 100 UNIT/ML SOLN [id number] No Inject 30 Units into the skin. [provider] Taking Active Self, Pharmacy Records  Insulin Glargine [facility]) 100 UNIT/ML [id number] No Inject 40 Units into the skin at bedtime. [provider] Taking Active Self, Pharmacy Records  Insulin Pen Needle 32G X 6 MM MISC [id number] No 1 each by Does not apply route as needed. Alison Salines, FNP Taking Active   lisinopril (ZESTRIL) 20 MG tablet [id number] No TAKE ONE TABLET BY MOUTH ONCE DAILY Alison Salines, FNP Taking Active Self, Pharmacy Records  lurasidone (LATUDA) 20 MG TABS tablet [id number] No TAKE 1 TABLET BY MOUTH ONCE DAILY WITH SUPPER *REFILL REQUESTJomarie Longs, MD Taking Active   magnesium oxide (MAG-OX) 400 MG tablet [id number] No Take by mouth. [provider] Taking Active Self, Pharmacy Records  Melatonin 10 MG TABS [id number] No Take 10 mg by mouth at bedtime. [provider] Taking Active Self, Pharmacy Records  meloxicam (MOBIC) 15 MG tablet [id number] No Take 1 tablet (15 mg total) by mouth daily. Cuthriell, [name], PA-C Taking Active Self, Pharmacy Records  metFORMIN (GLUCOPHAGE) 1000 MG tablet  [id number] No Take 1 tablet (1,000 mg total) by mouth 2 (two) times daily with a meal. Alison Salines, FNP Taking Active Self, Pharmacy Records  methocarbamol (ROBAXIN) 500 MG tablet [id number] No Take 1 tablet (500 mg total) by mouth 4 (four) times daily. Cuthriell, [name], PA-C Taking Active Self, Pharmacy Records  mirabegron ER (MYRBETRIQ) 50 MG TB24 tablet [id number] No Take 1 tablet (50 mg total) by mouth daily. Alison Salines, FNP Taking Active Self, Pharmacy Records  mirtazapine (REMERON) 15 MG tablet [id number] No TAKE 1 TABLET BY MOUTH EVERY DAY AT BEDTIME *REFILL REQUESTJomarie Longs, MD Taking Active   naloxone [facility]) nasal spray 4 mg/0.1 mL [id number] No Place 0.4 mg into the nose once. [provider] Taking Active Self, Pharmacy Records  NOVOLOG FLEXPEN 100 UNIT/ML FlexPen [id number] No Inject 0-30 Units into the skin 3 (three) times daily with meals. Sliding scale [provider] Taking Active Self, Pharmacy Records  oxyCODONE (OXY IR/ROXICODONE) 5 MG immediate release tablet [id number] No Take 5 mg by mouth 3 (three) times daily as needed. [provider] Taking Active   PRECISION QID TEST test strip [id number] No  [provider] Taking Active Self, Pharmacy Records  pregabalin (LYRICA) 200 MG capsule [id number] No Take 1 capsule (200 mg total) by mouth 2 (two) times daily. Phineas Semen, MD Taking Active Self, Pharmacy Records  rosuvastatin (CRESTOR) 10 MG tablet [id number] No Take 1 tablet (10 mg total)  by mouth daily. Alison Odea, MD Taking Active Self, Pharmacy Records  tacrolimus (PROGRAF) 1 MG capsule [id number] No Take 1 capsule by mouth every morning. [provider] Taking Active Self, Pharmacy Records  UBRELVY 50 MG TABS [id number] No Take 1 tablet by mouth daily as needed. [provider] Taking Active Self, Pharmacy Records  venlafaxine XR (EFFEXOR-XR) 37.5 MG 24 hr capsule [id number] No TAKE 1 CAPSULE BY MOUTH  DAILY WITH BREAKFAST ALONG WITH 75MG  DAILY *REFILL REQUEST* Alison [name], [name], MD Taking Active   venlafaxine XR (EFFEXOR-XR) 75 MG 24 hr capsule [id number] No TAKE 1 CAPSULE BY MOUTH ONCE DAILY WITH BREAKFAST ALONG WITH 37.5MG  TABLET *REFILL REQUESTJomarie Longs, MD Taking Active   XTAMPZA ER 9 MG C12A [id number] No Take 1 capsule by mouth every 12 (twelve) hours.  Patient not taking: Reported on 06/04/2023   [provider] Not Taking Active Self, Pharmacy Records           Patient Active Problem List   Diagnosis Date Noted   Acute idiopathic pericarditis 03/09/2023   Acute pericarditis 03/08/2023   Sepsis (HCC) 03/06/2023   Chest pain 03/06/2023   CKD (chronic kidney disease), stage II 01/22/2023   Insulin dependent type 2 diabetes mellitus (HCC) 01/22/2023   Overactive bladder 01/22/2023   High risk medication use 12/12/2022   Sjogren's syndrome (HCC) 05/27/2022   Menorrhagia with irregular cycle    Akathisia 10/24/2021   At risk for prolonged QT interval syndrome 09/12/2021   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 08/29/2021   Insomnia 07/20/2021   Bipolar disorder, in full remission, most recent episode mixed (HCC) 07/20/2021   GAD (generalized anxiety disorder) 05/09/2021   Opioid dependence with opioid-induced disorder (HCC) 12/20/2020   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   Carpal tunnel syndrome, left 07/26/2019   Cubital tunnel syndrome on left 07/26/2019   Umbilical hernia without obstruction and without gangrene 06/17/2019   Lumbar spondylosis 06/02/2019   Obesity (BMI 35.0-39.9 without comorbidity) 06/02/2019   Goals of care, counseling/discussion 05/18/2019   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   Fibromyalgia 03/23/2019   Systemic lupus erythematosus (SLE) in adult [facility]) 03/23/2019   Essential hypertension 03/23/2019   Mass of left kidney 03/23/2019   Nausea without vomiting 03/23/2019   Hyperlipidemia 03/23/2019   Osteoarthritis  03/23/2019   Trigeminal neuralgia 03/23/2019   Nephrolithiasis 03/23/2019   Migraine 03/23/2019   OSA on CPAP 03/23/2019   Fibrocystic breast 03/23/2019   Heart murmur 03/23/2019   Major depressive disorder, recurrent (HCC) 03/18/2017   Morbid obesity (HCC) 03/18/2017   Neuropathic pain 11/19/2016   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HCC) 01/13/2015   Lumbar disc disease with radiculopathy 07/26/2013   S/P liver transplant (HCC) 07/26/2013   Facial nerve disorder 06/29/2012   Low back pain 12/26/2010   Conditions to be addressed/monitored per PCP order:  Chronic healthcare management needs, anxiety/depression/BP/PTSD, OSD, migraines, DM, HTN, chronic pain, DLBCL, lymphoma, SLE, LBP, LDD, HLD, fibromyalgia, osteoarthritis, liver transplant  Care Plan : Chronic Pain (Adult)  Updates made by Alison Chandler, RN since 06/05/2023 12:00 AM     Problem: Chronic Pain Management-fibromyalgia   Priority: High  Onset Date: 01/22/2021     Long-Range Goal: Fibromyalgia pain managed-new pain management provider   Start Date: 08/14/2020  Expected End Date: 09/05/2023  Recent Progress: On track  Priority: High  Note:   Current Barriers:  Knowledge deficits related to pain management  06/05/23:  Patient now taking Oxycodone for pain and  is working well. Xtampza ER no longer covered by insurance.  Nurse Case Manager Clinical Goal(s):  Over the next 45 days, patient will work with [facility] to address needs related to referral for pain management provider and associated care coordination needs. 05/24/22:  patient is currently seeing Dr. Welton Flakes at [facility].  Interventions:  Inter-disciplinary care team collaboration (see longitudinal plan of care) Evaluation of current treatment plan related to fibromyalgia  and patient's adherence to plan as established by provider. Update 06/15/21:  Patient taking Baclofen again, Effexor added. Collaborated with primary care provider  regarding recommendations and referral to pain management provider. Discussed plans with patient for ongoing care management follow up and provided patient with direct contact information for care management team Anticipate pain education program, pain management support as part of pain management referral.       Patient Goals/Self-Care Activities Over the next 45 days, patient will:  -Attends all scheduled provider appointments  develop a personal pain management plan with your pain management provider. Patient will f/u with Dr. Welton Flakes and insurance regarding auth.  Follow Up Plan:  The Managed Medicaid care management team will reach out to the patient again over the next 60 business days.   Care Plan : Wellness (Adult)  Updates made by Alison Chandler, RN since 06/05/2023 12:00 AM     Problem: Medication Adherence (Wellness)   Priority: High  Onset Date: 01/22/2021     Long-Range Goal: Medication Adherence Maintained   Start Date: 09/06/2020  Expected End Date: 09/05/2023  Recent Progress: On track  Priority: High  Note:   Current Barriers:  Chronic Disease Management of chronic health conditions, OSD, migraines, DM, HTN, chronic pain, anxiety/depression/ Bipolar/PTSD, SLE, LDD, HLD, fibromyalgia, osteoarthritis, LBP, DLBCL, liver transplant, lymphoma 06/05/23:  No complaints today-pain under control with Oxycodone, Xtampza ER no longer covered by insurance.  Does not check BP.  Blood sugars low 200s per patient-to establish with new ENDO-previous one retired.  Recent CARDS, ONC appts WNL-Nephrology appt next month.  Nurse Case Manager Clinical Goal(s):  Over the next 30 days, patient will work with CM team pharmacist to review current medications. Over the next 30 days, patient will meet with Nutritionist and check her blood sugars. Over the next 30 days, patient will attend all scheduled appointments.  Interventions:  Inter-disciplinary care team collaboration (see longitudinal  plan of care) Evaluation of current treatment plan and patient's adherence to plan as established by provider. Advised patient to contact her PCP for any medication needs. Reviewed medications with patient. Collaborated with pharmacy regarding medications.  Discussed plans with patient for ongoing care management follow up and provided patient with direct contact information for care management team Pharmacy referral for medication review. Patient given phone number for [facility] transportation if needed. Will notify PCP of need for testing strips and dietician referral. Collaborated with PCP for cardiology appointment. Collaborated with [name] for dental resources. [name] referral for dental resources-completed.  Hyperlipidemia Interventions:  (Status:  New goal.) Long Term Goal Medication review performed; medication list updated in electronic medical record.  Provider established cholesterol goals reviewed Counseled on importance of regular laboratory monitoring as prescribed Reviewed importance of limiting foods high in cholesterol Reviewed exercise goals and target of 150 minutes per week Screening for signs and symptoms of depression related to chronic disease state Assessed social determinant of health barriers    Diabetes Interventions:  (Status:  New goal.) Long Term Goal Assessed patient's understanding of A1c  goal: <7% Reviewed medications with patient and discussed importance of medication adherence Counseled on importance of regular laboratory monitoring as prescribed Discussed plans with patient for ongoing care management follow up and provided patient with direct contact information for care management team Reviewed scheduled/upcoming provider appointments Advised patient, providing education and rationale, to check cbg as directed and record, calling provider for findings outside established parameters Review of patient status, including review of  consultants reports, relevant laboratory and other test results, and medications completed Assessed social determinant of health barriers Lab Results  Component Value Date   HGBA1C 6.4 03/06/2022             HGBA1C                                                      8.8                                    03/19/223  Hyperlipidemia Interventions:  (Status:  New goal.) Long Term Goal Medication review performed; medication list updated in electronic medical record.  Provider established cholesterol goals reviewed Reviewed importance of limiting foods high in cholesterol Assessed social determinant of health barriers   Hypertension Interventions:  (Status:  New goal.) Long Term Goal Last practice recorded BP readings:  BP Readings from Last 3 Encounters:   03/19/23        124/72 04/29/23        120/68 06/04/23        124/92  Most recent eGFR/CrCl:  Lab Results  Component Value Date   EGFR 29 (L) 05/27/2022    No components found for: "CRCL"  Evaluation of current treatment plan related to hypertension self management and patient's adherence to plan as established by provider Reviewed medications with patient and discussed importance of compliance Discussed plans with patient for ongoing care management follow up and provided patient with direct contact information for care management team Advised patient, providing education and rationale, to monitor blood pressure daily and record, calling PCP for findings outside established parameters Reviewed scheduled/upcoming provider appointments including:  Provided education on prescribed diet Discussed complications of poorly controlled blood pressure such as heart disease, stroke, circulatory complications, vision complications, kidney impairment, sexual dysfunction Assessed social determinant of health barriers  Pain Interventions:  (Status:  New goal.) Long Term Goal Pain assessment performed Medications reviewed Reviewed provider  established plan for pain management Discussed importance of adherence to all scheduled medical appointments Counseled on the importance of reporting any/all new or changed pain symptoms or management strategies to pain management provider Advised patient to report to care team affect of pain on daily activities Discussed use of relaxation techniques and/or diversional activities to assist with pain reduction (distraction, imagery, relaxation, massage, acupressure, TENS, heat, and cold application Reviewed with patient prescribed pharmacological and nonpharmacological pain relief strategies Assessed social determinant of health barriers   Patient Goals/Self-Care Activities Over the next 30 days, patient will:  -Patient will take medications as prescribed. Calls pharmacy for medication refills Calls provider office for new concerns or questions Patient to schedule an appt with DERM,  ENDO  Follow Up Plan: The Managed Medicaid care management team will reach out to the patient again over the next 60 business days.  The patient has been provided with contact information for the Managed Medicaid care management team and has been advised to call with any health related questions or concerns.    Follow Up:  Patient agrees to Care Plan and Follow-up.  Plan: The Managed Medicaid care management team will reach out to the patient again over the next 60 business  days. and The  Patient has been provided with contact information for the Managed Medicaid care management team and has been advised to call with any health related questions or concerns.  Date/time of next scheduled RN care management/care coordination outreach: 08/05/23 at 1230

## 2023-06-05 NOTE — Patient Instructions (Signed)
Hi Alison [name], glad you are doing well today-have a nice afternoon!  Alison [name] was given information about Medicaid Managed Care team care coordination services as a part of their Healthy [facility] Medicaid benefit. Alison [name] verbally consented to engagement with the [facility] Managed Care team.   If you are experiencing a medical emergency, please call 911 or report to your local emergency department or urgent care.   If you have a non-emergency medical problem during routine business hours, please contact your provider's office and ask to speak with a nurse.   For questions related to your Healthy [facility] health plan, please call: [phone] or visit the homepage here: [email or web address]  If you would like to schedule transportation through your Healthy [facility] plan, please call the following number at least 2 days in advance of your appointment: [phone]  For information about your ride after you set it up, call Ride Assist at [phone]. Use this number to activate a Will Call pickup, or if your transportation is late for a scheduled pickup. Use this number, too, if you need to make a change or cancel a previously scheduled reservation.  If you need transportation services right away, call [phone]. The after-hours call center is staffed 24 hours to handle ride assistance and urgent reservation requests (including discharges) 365 days a year. Urgent trips include sick visits, hospital discharge requests and life-sustaining treatment.  Call the [facility] Line at [phone], at any time, 24 hours a day, 7 days a week. If you are in danger or need immediate medical attention call 911.  If you would like help to quit smoking, call 1-800-QUIT-NOW (814-635-1399) OR Espaol: 1-855-Djelo-Ya (3-329-518-8416) o para ms informacin haga clic aqu or Text READY to 606-301 to register via text  Alison [name] - following  are the goals we discussed in your visit today:   Goals Addressed             This Visit's Progress    Chronic Pain Managed       06/05/23:  patient now taking Oxycodone-insurance no longer covering Xtampza ER     Get sugars at goal       Timeframe:  Short-Term Goal Priority:  High Start Date:                             Expected End Date:      ongoing                 Follow Up Date: 08/05/23   - call for medicine refill 2 or 3 days before it runs out - call if I am sick and can't take my medicine - keep a list of all the medicines I take; vitamins and herbals too    Why is this important?   These steps will help you keep on track with your medicines. 06/05/23:  Blood sugars low 200s per patient-to make an appt with new ENDO-previous one retired.   Patient verbalizes understanding of instructions and care plan provided today and agrees to view in MyChart. Active MyChart status and patient understanding of how to access instructions and care plan via MyChart confirmed with patient.     The Managed Medicaid care management team will reach out to the patient again over the next 60 business  days.  The  Patient  has been provided with contact information for the Managed Medicaid care management team and has been advised  to call with any health related questions or concerns.   Kathi Der RN, [name] [place]  [facility] Care Management Coordinator - Managed Medicaid High Risk [phone]   Following is a copy of your plan of care:  Care Plan : Chronic Pain (Adult)  Updates made by [name], RN since 06/05/2023 12:00 AM     Problem: Chronic Pain Management-fibromyalgia   Priority: High  Onset Date: 01/22/2021     Long-Range Goal: Fibromyalgia pain managed-new pain management provider   Start Date: 08/14/2020  Expected End Date: 09/05/2023  Recent Progress: On track  Priority: High  Note:   Current Barriers:  Knowledge deficits related to pain  management  06/05/23:  Patient now taking Oxycodone for pain and is working well. Xtampza ER no longer covered by insurance.  Nurse Case Manager Clinical Goal(s):  Over the next 45 days, patient will work with [facility] to address needs related to referral for pain management provider and associated care coordination needs. 05/24/22:  patient is currently seeing Dr. Welton Flakes at [facility].  Interventions:  Inter-disciplinary care team collaboration (see longitudinal plan of care) Evaluation of current treatment plan related to fibromyalgia  and patient's adherence to plan as established by provider. Update 06/15/21:  Patient taking Baclofen again, Effexor added. Collaborated with primary care provider regarding recommendations and referral to pain management provider. Discussed plans with patient for ongoing care management follow up and provided patient with direct contact information for care management team Anticipate pain education program, pain management support as part of pain management referral.       Patient Goals/Self-Care Activities Over the next 45 days, patient will:  -Attends all scheduled provider appointments  develop a personal pain management plan with your pain management provider. Patient will f/u with Dr. Welton Flakes and insurance regarding auth.  Follow Up Plan:  The Managed Medicaid care management team will reach out to the patient again over the next 60 business days.   Care Plan : Wellness (Adult)  Updates made by [name], RN since 06/05/2023 12:00 AM     Problem: Medication Adherence (Wellness)   Priority: High  Onset Date: 01/22/2021     Long-Range Goal: Medication Adherence Maintained   Start Date: 09/06/2020  Expected End Date: 09/05/2023  Recent Progress: On track  Priority: High  Note:   Current Barriers:  Chronic Disease Management of chronic health conditions, OSD, migraines, DM, HTN, chronic pain, anxiety/depression/ Bipolar/PTSD,  SLE, LDD, HLD, fibromyalgia, osteoarthritis, LBP, DLBCL, liver transplant, lymphoma 06/05/23:  No complaints today-pain under control with Oxycodone, Xtampza ER no longer covered by insurance.  Does not check BP.  Blood sugars low 200s per patient-to establish with new ENDO-previous one retired.  Recent CARDS, ONC appts WNL-Nephrology appt next month.  Nurse Case Manager Clinical Goal(s):  Over the next 30 days, patient will work with CM team pharmacist to review current medications. Over the next 30 days, patient will meet with Nutritionist and check her blood sugars. Over the next 30 days, patient will attend all scheduled appointments.  Interventions:  Inter-disciplinary care team collaboration (see longitudinal plan of care) Evaluation of current treatment plan and patient's adherence to plan as established by provider. Advised patient to contact her PCP for any medication needs. Reviewed medications with patient. Collaborated with pharmacy regarding medications.  Discussed plans with patient for ongoing care management follow up and provided patient with direct contact information for care management team Pharmacy referral for medication review. Patient  given phone number for [facility] transportation if needed. Will notify PCP of need for testing strips and dietician referral. Collaborated with PCP for cardiology appointment. Collaborated with [name] for dental resources. [name] referral for dental resources-completed.  Hyperlipidemia Interventions:  (Status:  New goal.) Long Term Goal Medication review performed; medication list updated in electronic medical record.  Provider established cholesterol goals reviewed Counseled on importance of regular laboratory monitoring as prescribed Reviewed importance of limiting foods high in cholesterol Reviewed exercise goals and target of 150 minutes per week Screening for signs and symptoms of depression related to chronic  disease state Assessed social determinant of health barriers    Diabetes Interventions:  (Status:  New goal.) Long Term Goal Assessed patient's understanding of A1c goal: <7% Reviewed medications with patient and discussed importance of medication adherence Counseled on importance of regular laboratory monitoring as prescribed Discussed plans with patient for ongoing care management follow up and provided patient with direct contact information for care management team Reviewed scheduled/upcoming provider appointments Advised patient, providing education and rationale, to check cbg as directed and record, calling provider for findings outside established parameters Review of patient status, including review of consultants reports, relevant laboratory and other test results, and medications completed Assessed social determinant of health barriers Lab Results  Component Value Date   HGBA1C 6.4 03/06/2022             HGBA1C                                                      8.8                                    03/19/223  Hyperlipidemia Interventions:  (Status:  New goal.) Long Term Goal Medication review performed; medication list updated in electronic medical record.  Provider established cholesterol goals reviewed Reviewed importance of limiting foods high in cholesterol Assessed social determinant of health barriers   Hypertension Interventions:  (Status:  New goal.) Long Term Goal Last practice recorded BP readings:  BP Readings from Last 3 Encounters:   03/19/23        124/72 04/29/23        120/68 06/04/23        124/92  Most recent eGFR/CrCl:  Lab Results  Component Value Date   EGFR 29 (L) 05/27/2022    No components found for: "CRCL"  Evaluation of current treatment plan related to hypertension self management and patient's adherence to plan as established by provider Reviewed medications with patient and discussed importance of compliance Discussed plans with  patient for ongoing care management follow up and provided patient with direct contact information for care management team Advised patient, providing education and rationale, to monitor blood pressure daily and record, calling PCP for findings outside established parameters Reviewed scheduled/upcoming provider appointments including:  Provided education on prescribed diet Discussed complications of poorly controlled blood pressure such as heart disease, stroke, circulatory complications, vision complications, kidney impairment, sexual dysfunction Assessed social determinant of health barriers  Pain Interventions:  (Status:  New goal.) Long Term Goal Pain assessment performed Medications reviewed Reviewed provider established plan for pain management Discussed importance of adherence to all scheduled medical appointments Counseled on the importance of reporting any/all  new or changed pain symptoms or management strategies to pain management provider Advised patient to report to care team affect of pain on daily activities Discussed use of relaxation techniques and/or diversional activities to assist with pain reduction (distraction, imagery, relaxation, massage, acupressure, TENS, heat, and cold application Reviewed with patient prescribed pharmacological and nonpharmacological pain relief strategies Assessed social determinant of health barriers   Patient Goals/Self-Care Activities Over the next 30 days, patient will:  -Patient will take medications as prescribed. Calls pharmacy for medication refills Calls provider office for new concerns or questions Patient to schedule an appt with DERM,  ENDO  Follow Up Plan: The Managed Medicaid care management team will reach out to the patient again over the next 60 business days.  The patient has been provided with contact information for the Managed Medicaid care management team and has been advised to call with any health related questions or  concerns.

## 2023-06-11 ENCOUNTER — Telehealth: Payer: Self-pay

## 2023-06-11 NOTE — Telephone Encounter (Signed)
went online to [email or web address] and submitted the prior auth . - pending 

## 2023-06-11 NOTE — Telephone Encounter (Signed)
received a fax from [email or web address] that a prior auth was needed for the lurasidone hcl 20mg 

## 2023-06-11 NOTE — Telephone Encounter (Signed)
prior Alison [name] was approved from 06-11-23 to 06-10-24

## 2023-06-23 DIAGNOSIS — G894 Chronic pain syndrome: Secondary | ICD-10-CM | POA: Diagnosis not present

## 2023-06-23 DIAGNOSIS — Z79891 Long term (current) use of opiate analgesic: Secondary | ICD-10-CM | POA: Diagnosis not present

## 2023-06-23 DIAGNOSIS — M25519 Pain in unspecified shoulder: Secondary | ICD-10-CM | POA: Diagnosis not present

## 2023-06-23 DIAGNOSIS — M79669 Pain in unspecified lower leg: Secondary | ICD-10-CM | POA: Diagnosis not present

## 2023-06-23 DIAGNOSIS — M5442 Lumbago with sciatica, left side: Secondary | ICD-10-CM | POA: Diagnosis not present

## 2023-06-23 DIAGNOSIS — M79606 Pain in leg, unspecified: Secondary | ICD-10-CM | POA: Diagnosis not present

## 2023-06-23 DIAGNOSIS — G893 Neoplasm related pain (acute) (chronic): Secondary | ICD-10-CM | POA: Diagnosis not present

## 2023-06-23 DIAGNOSIS — M5414 Radiculopathy, thoracic region: Secondary | ICD-10-CM | POA: Diagnosis not present

## 2023-06-23 DIAGNOSIS — I10 Essential (primary) hypertension: Secondary | ICD-10-CM | POA: Diagnosis not present

## 2023-06-23 DIAGNOSIS — F3189 Other bipolar disorder: Secondary | ICD-10-CM | POA: Diagnosis not present

## 2023-06-23 DIAGNOSIS — M79603 Pain in arm, unspecified: Secondary | ICD-10-CM | POA: Diagnosis not present

## 2023-06-27 ENCOUNTER — Encounter: Payer: Self-pay | Admitting: Nurse Practitioner

## 2023-07-02 ENCOUNTER — Other Ambulatory Visit: Payer: Self-pay | Admitting: Nurse Practitioner

## 2023-07-02 DIAGNOSIS — N182 Chronic kidney disease, stage 2 (mild): Secondary | ICD-10-CM | POA: Diagnosis not present

## 2023-07-02 DIAGNOSIS — E1122 Type 2 diabetes mellitus with diabetic chronic kidney disease: Secondary | ICD-10-CM | POA: Diagnosis not present

## 2023-07-05 DIAGNOSIS — F3181 Bipolar II disorder: Secondary | ICD-10-CM | POA: Diagnosis not present

## 2023-07-05 DIAGNOSIS — F411 Generalized anxiety disorder: Secondary | ICD-10-CM | POA: Diagnosis not present

## 2023-07-05 DIAGNOSIS — F4310 Post-traumatic stress disorder, unspecified: Secondary | ICD-10-CM | POA: Diagnosis not present

## 2023-07-21 ENCOUNTER — Ambulatory Visit: Payer: Medicaid Other | Admitting: Psychiatry

## 2023-07-21 ENCOUNTER — Encounter: Payer: Self-pay | Admitting: Psychiatry

## 2023-07-21 VITALS — BP 124/83 | HR 71 | Temp 97.8°F | Ht 66.0 in | Wt 254.8 lb

## 2023-07-21 DIAGNOSIS — F4310 Post-traumatic stress disorder, unspecified: Secondary | ICD-10-CM | POA: Diagnosis not present

## 2023-07-21 DIAGNOSIS — M5414 Radiculopathy, thoracic region: Secondary | ICD-10-CM | POA: Diagnosis not present

## 2023-07-21 DIAGNOSIS — F411 Generalized anxiety disorder: Secondary | ICD-10-CM | POA: Diagnosis not present

## 2023-07-21 DIAGNOSIS — M79669 Pain in unspecified lower leg: Secondary | ICD-10-CM | POA: Diagnosis not present

## 2023-07-21 DIAGNOSIS — M25519 Pain in unspecified shoulder: Secondary | ICD-10-CM | POA: Diagnosis not present

## 2023-07-21 DIAGNOSIS — Z79891 Long term (current) use of opiate analgesic: Secondary | ICD-10-CM | POA: Diagnosis not present

## 2023-07-21 DIAGNOSIS — G4701 Insomnia due to medical condition: Secondary | ICD-10-CM | POA: Diagnosis not present

## 2023-07-21 DIAGNOSIS — G893 Neoplasm related pain (acute) (chronic): Secondary | ICD-10-CM | POA: Diagnosis not present

## 2023-07-21 DIAGNOSIS — F3176 Bipolar disorder, in full remission, most recent episode depressed: Secondary | ICD-10-CM | POA: Diagnosis not present

## 2023-07-21 DIAGNOSIS — G894 Chronic pain syndrome: Secondary | ICD-10-CM | POA: Diagnosis not present

## 2023-07-21 DIAGNOSIS — M79606 Pain in leg, unspecified: Secondary | ICD-10-CM | POA: Diagnosis not present

## 2023-07-21 DIAGNOSIS — I10 Essential (primary) hypertension: Secondary | ICD-10-CM | POA: Diagnosis not present

## 2023-07-21 DIAGNOSIS — F3189 Other bipolar disorder: Secondary | ICD-10-CM | POA: Diagnosis not present

## 2023-07-21 DIAGNOSIS — M79603 Pain in arm, unspecified: Secondary | ICD-10-CM | POA: Diagnosis not present

## 2023-07-21 DIAGNOSIS — M5442 Lumbago with sciatica, left side: Secondary | ICD-10-CM | POA: Diagnosis not present

## 2023-07-21 NOTE — Progress Notes (Unsigned)
BH MD OP Progress Note  07/21/2023 12:28 PM Alison [name]  MRN:  [id number]  Chief Complaint:  Chief Complaint  Patient presents with   Follow-up   Anxiety   Depression   Medication Refill   HPI: Alison [name] is a 47 year old Caucasian female, lives in [place], has a history of bipolar disorder, PTSD, medical problems including fibromyalgia, history of liver transplant, B-cell lymphoma, SLE, diabetes mellitus, stage IV chronic kidney disease, OSA, chronic migraine headaches, hyperlipidemia, hypertension was evaluated in office today.  Patient today reports she is currently doing fairly well with regards to her mood.  She has been keeping herself busy with taking up projects, craft works she reports she has a friend that she spends time with and they have been supporting each other.  They are doing a lot of projects together.  She has been going to the book store, [employer] , and has been getting books from there.  She is currently reading the game of problems.  She is enjoying it.  Patient reports she is currently compliant on medications.  She had some trouble getting the venlafaxine from the pharmacy how she is planning to talk to them.  Denies side effects.  She uses the Cogentin only as needed.  She does not believe she needs it every day.  Patient reports she was unable to get [facility] ER for pain however was able to get immediate release oxycodone, which is managing her pain.  Patient denies any suicidality, homicidality or perceptual disturbances.  Patient denies any other concerns today.  Visit Diagnosis:    ICD-10-CM   1. Bipolar 1 disorder, depressed, full remission (HCC)  F31.76     2. PTSD (post-traumatic stress disorder)  F43.10     3. GAD (generalized anxiety disorder)  F41.1     4. Insomnia due to medical condition  G47.01    mood, pain      Past Psychiatric History: I have reviewed past psychiatric history from progress note on 06/10/2019.  Past Medical  History:  Past Medical History:  Diagnosis Date   Abnormal uterine bleeding    Allergy    Anxiety    Arthritis    Bipolar disorder (manic depression) (HCC)    Chronic kidney failure    Chronic renal disease, stage III (HCC)    Diabetes mellitus without complication (HCC)    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HCC) 2015   Right axillary lymph node resected and chemo tx's.   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 02/2023 Echo: EF 55-60%, no rwma, nl RV size/fxn. Mildly dil LA.   Personality disorder (HCC)    Pineal gland cyst    Post herpetic neuralgia    PTSD (post-traumatic stress disorder)    Renal disorder    S/P liver transplant (HCC) 1993   Sleep apnea    has not recieved cpap yet-other one was recalled  Past Surgical History:  Procedure Laterality Date   BONE MARROW BIOPSY  01/13/2015   BREAST BIOPSY  12/2014   BREAST BIOPSY  2011   BREAST SURGERY     CHOLECYSTECTOMY     COLONOSCOPY     COLONOSCOPY WITH PROPOFOL N/A 02/28/2022   Procedure: COLONOSCOPY WITH PROPOFOL;  Surgeon: Midge Minium, MD;  Location[facility] ENDOSCOPY;  Service: Endoscopy;  Laterality: N/A;   ESOPHAGOGASTRODUODENOSCOPY     ESOPHAGOGASTRODUODENOSCOPY (EGD) WITH PROPOFOL N/A 01/09/2021   Procedure: ESOPHAGOGASTRODUODENOSCOPY (EGD) WITH PROPOFOL;  Surgeon: Midge Minium, MD;  Location: [facility] ENDOSCOPY;  Service: Endoscopy;  Laterality: N/A;   HERNIA REPAIR     x4-all from liver transplant   HYSTEROSCOPY WITH D & C N/A 11/13/2021   Procedure: DILATATION AND CURETTAGE /HYSTEROSCOPY;  Surgeon:  Natale Milch, MD;  Location: [facility] ORS;  Service: Gynecology;  Laterality: N/A;   infusaport     LIVER TRANSPLANT  12/17/1991   LUMBAR PUNCTURE     PORT A CATH INJECTION ([facility] HX)     RENAL BIOPSY     tumor removal  2015   cancer right side    Family Psychiatric History: I have reviewed family psychiatric history from progress note on 06/10/2019.  Family History:  Family History  Problem Relation Age of Onset   Heart disease Mother    Hypertension Mother    Cancer - Other Mother    Bipolar disorder Mother    Heart disease Father    Hypertension Father    Diabetes Father    Parkinson's disease Maternal Grandmother    Cancer Maternal Aunt    Cancer Maternal Uncle    Cancer Maternal Grandfather    Lupus Paternal Grandmother    Hypertension Brother     Social History: I have reviewed social history from progress note on 06/10/2019. Social History   Socioeconomic History   Marital status: Single    Spouse name: Not on file   Number of children: 0   Years of education: 9   Highest education level: GED or equivalent  Occupational History   Occupation: disabled  Tobacco Use   Smoking status: Never   Smokeless tobacco: Never  Vaping Use   Vaping status: Never Used  Substance and Sexual Activity   Alcohol use: Not Currently   Drug use: Not Currently    Comment: pain managment last used in early april   Sexual activity: Not Currently  Other Topics Concern   Not on file  Social History Narrative   Not on file   Social Determinants of Health   Financial Resource Strain: Low Risk  (03/25/2023)   Overall Financial Resource Strain (CARDIA)    Difficulty of Paying Living Expenses: Not very hard  Food Insecurity: No Food Insecurity (03/06/2023)   Hunger Vital Sign    Worried About Running Out of Food in the Last Year: Never true    Ran Out of Food in the Last Year: Never true  Transportation Needs: No Transportation Needs (03/06/2023)   PRAPARE - Therapist, art (Medical): No    Lack of Transportation (Non-Medical): No  Physical Activity: Insufficiently Active (03/25/2023)   Exercise Vital Sign    Days of Exercise per Week: 3 days    Minutes of Exercise per Session: 10 min  Stress: Stress Concern Present (04/29/2023)   [employer] of Occupational Health - Occupational Stress Questionnaire    Feeling of Stress : To some extent  Social Connections: Moderately Integrated (06/05/2023)  Social Advertising account executive [NHANES]    Frequency of Communication with Friends and Family: More than three times a week    Frequency of Social Gatherings with Friends and Family: More than three times a week    Attends Religious Services: More than 4 times per year    Active Member of [employer] or Organizations: No    Attends Banker Meetings: Never    Marital Status: Living with partner    Allergies:  Allergies  Allergen Reactions   Penicillin G Itching, Rash, Anaphylaxis and Palpitations   Lamictal [Lamotrigine] Rash   Tape    Carbamazepine Other (See Comments)    Medication interaction-prograf    Hydrocodone-Acetaminophen Itching   Naproxen Itching   Penicillins    Vancomycin Rash    Macular/blotchy rash at infusion site    Metabolic Disorder Labs: Lab Results  Component Value Date   HGBA1C 8.8 (H) 03/07/2023   MPG 206 03/07/2023   MPG 209 06/05/2020   Lab Results  Component Value Date   PROLACTIN 16.4 12/12/2022   PROLACTIN 13.4 07/15/2019   Lab Results  Component Value Date   CHOL 66 03/08/2023   TRIG 87 03/08/2023   HDL 30 (L) 03/08/2023   CHOLHDL 2.2 03/08/2023   VLDL 17 03/08/2023   LDLCALC 19 03/08/2023   LDLCALC 19 05/27/2022   Lab Results  Component Value Date   TSH 4.878 (H) 03/09/2023   TSH 2.650 07/15/2019    Therapeutic Level Labs: No results found for: "LITHIUM" No results found for: "VALPROATE" No results found for: "CBMZ"  Current Medications: Current Outpatient  Medications  Medication Sig Dispense Refill   baclofen (LIORESAL) 10 MG tablet Take 1 tablet by mouth 2 (two) times daily.     benztropine (COGENTIN) 1 MG tablet TAKE 1 TABLET BY MOUTH ONCE DAILY AS NEEDED FOR TREMORS 30 tablet 1   Cholecalciferol (VITAMIN D) 125 MCG (5000 UT) CAPS Take 5,000 Units by mouth daily.     dronabinol (MARINOL) 2.5 MG capsule Take 2.5 mg by mouth See admin instructions. Takes every other day (Patient not taking: Reported on 06/04/2023)     Dulaglutide 4.5 MG/0.5ML SOPN Inject 4.5 mg into the skin once a week.     ezetimibe (ZETIA) 10 MG tablet Take 10 mg by mouth every morning.     ibuprofen (ADVIL) 400 MG tablet Take 1 tablet (400 mg total) by mouth every 6 (six) hours as needed. 30 tablet 0   icosapent Ethyl (VASCEPA) 1 g capsule Take 2 capsules (2 g total) by mouth 2 (two) times daily. 120 capsule 5   Insulin Degludec (TRESIBA) 100 UNIT/ML SOLN Inject 30 Units into the skin.     Insulin Glargine (BASAGLAR KWIKPEN) 100 UNIT/ML Inject 40 Units into the skin at bedtime.     Insulin Pen Needle 32G X 6 MM MISC 1 each by Does not apply route as needed. 100 each 5   lisinopril (ZESTRIL) 20 MG tablet TAKE ONE TABLET BY MOUTH ONCE DAILY 90 tablet 3   lurasidone (LATUDA) 20 MG TABS tablet TAKE 1 TABLET BY MOUTH ONCE DAILY WITH SUPPER *REFILL REQUEST* 90 tablet 10   magnesium oxide (MAG-OX) 400 MG tablet Take by mouth.     Melatonin 10 MG TABS Take 10 mg by mouth at bedtime.     meloxicam (MOBIC) 15 MG tablet Take 1 tablet (15 mg total) by mouth daily. 30 tablet 0   metFORMIN (GLUCOPHAGE) 1000 MG tablet Take 1 tablet (1,000  mg total) by mouth 2 (two) times daily with a meal. 180 tablet 3   methocarbamol (ROBAXIN) 500 MG tablet Take 1 tablet (500 mg total) by mouth 4 (four) times daily. 16 tablet 0   mirabegron ER (MYRBETRIQ) 50 MG TB24 tablet Take 1 tablet (50 mg total) by mouth daily. 90 tablet 3   mirtazapine (REMERON) 15 MG tablet TAKE 1 TABLET BY MOUTH EVERY DAY AT BEDTIME  *REFILL REQUEST* 90 tablet 10   naloxone (NARCAN) nasal spray 4 mg/0.1 mL Place 0.4 mg into the nose once.     NOVOLOG FLEXPEN 100 UNIT/ML FlexPen Inject 0-30 Units into the skin 3 (three) times daily with meals. Sliding scale     oxyCODONE (OXY IR/ROXICODONE) 5 MG immediate release tablet Take 5 mg by mouth 3 (three) times daily as needed.     PRECISION QID TEST test strip      pregabalin (LYRICA) 200 MG capsule Take 1 capsule (200 mg total) by mouth 2 (two) times daily. 60 capsule 0   rosuvastatin (CRESTOR) 10 MG tablet Take 1 tablet (10 mg total) by mouth daily. 90 tablet 2   tacrolimus (PROGRAF) 1 MG capsule Take 1 capsule by mouth every morning.     UBRELVY 50 MG TABS Take 1 tablet by mouth daily as needed.     venlafaxine XR (EFFEXOR-XR) 37.5 MG 24 hr capsule TAKE 1 CAPSULE BY MOUTH DAILY WITH BREAKFAST ALONG WITH 75MG  DAILY *REFILL REQUEST* 90 capsule 10   venlafaxine XR (EFFEXOR-XR) 75 MG 24 hr capsule TAKE 1 CAPSULE BY MOUTH ONCE DAILY WITH BREAKFAST ALONG WITH 37.5MG  TABLET *REFILL REQUEST* 90 capsule 10   XTAMPZA ER 9 MG C12A Take 1 capsule by mouth every 12 (twelve) hours. (Patient not taking: Reported on 06/04/2023)     No current facility-administered medications for this visit.     Musculoskeletal: Strength & Muscle Tone: within normal limits Gait & Station: normal Patient leans: N/A  Psychiatric Specialty Exam: Review of Systems  Psychiatric/Behavioral: Negative.      Blood pressure 124/83, pulse 71, temperature 97.8 F (36.6 C), temperature source Skin, height 5\' 6"  (1.676 m), weight 254 lb 12.8 oz (115.6 kg).Body mass index is 41.13 kg/m.  General Appearance: Casual  Eye Contact:  Fair  Speech:  Clear and Coherent  Volume:  Normal  Mood:  Euthymic  Affect:  Appropriate  Thought Process:  Goal Directed and Descriptions of Associations: Intact  Orientation:  Full (Time, Place, and Person)  Thought Content: Logical   Suicidal Thoughts:  No  Homicidal Thoughts:  No   Memory:  Immediate;   Fair Recent;   Fair Remote;   Fair  Judgement:  Fair  Insight:  Fair  Psychomotor Activity:  Normal  Concentration:  Concentration: Fair and Attention Span: Fair  Recall:  [employer] of Knowledge: Fair  Language: Fair  Akathisia:  No  Handed:  Right  AIMS (if indicated): done  Assets:  Communication Skills Desire for Improvement Housing Social Support  ADL's:  Intact  Cognition: WNL  Sleep:  Fair   Screenings: Geneticist, molecular Office Visit from 07/21/2023 in [facility] Office Visit from 12/12/2022 in [facility] Office Visit from 09/12/2022 in [facility] Office Visit from 07/08/2022 in [facility] Video Visit from 03/18/2022 in [facility]  AIMS Total Score 0 0 0 0 0  GAD-7    Flowsheet Row Office Visit from 07/21/2023 in [facility] Office Visit from 03/19/2023 in [facility] Visit from 03/17/2023 in [facility] Office Visit from 01/22/2023 in [facility] Office Visit from 12/12/2022 in [facility]  Total GAD-7 Score 8 2 14 8 7       PHQ2-9    Flowsheet Row Office Visit from 07/21/2023 in [facility] Office Visit from 03/19/2023 in [facility] Video Visit from 03/17/2023 in [facility] Office Visit from 01/22/2023 in [facility] Office Visit from 12/12/2022 in [facility]  PHQ-2 Total Score 2 2 0 4 2  PHQ-9 Total Score 10 6 -- 11 7      Flowsheet Row Office Visit from 07/21/2023 in [facility] ED from 04/21/2023 in [facility] Emergency Department at [facility] Video Visit from 04/18/2023 in [facility]  C-SSRS RISK CATEGORY No Risk No Risk Low Risk        Assessment and Plan: Alison [name] is a 47 year old Caucasian female on disability, has a history of bipolar disorder, PTSD, multiple medical problems was evaluated in office today.  Patient is currently stable, plan as noted below.  Plan Bipolar disorder type I depressed in remission Latuda 20 mg p.o. daily with supper Venlafaxine 112.5 mg p.o. daily Cogentin 1 mg p.o. daily as needed for side effects of Latuda  GAD-stable Mirtazapine 15 mg p.o. nightly Venlafaxine 112.5 mg p.o. daily Continue CBT as needed  PTSD-stable Venlafaxine as prescribed Continue CBT  Insomnia-stable Mirtazapine 15 mg p.o. nightly Continue CPAP Melatonin 5 to 10 mg p.o. nightly Continue sleep hygiene techniques.    Consent: Patient/Guardian gives verbal consent for treatment and assignment of benefits for services provided during this visit. Patient/Guardian expressed understanding and agreed to proceed.   Follow-up in clinic in 2 months or sooner if needed.  This note was generated in part or whole with voice recognition software. Voice recognition is usually quite accurate but there are transcription errors that can and very often do occur. I apologize for any typographical errors that were not detected and corrected.    [name], MD 07/21/2023, 12:28 PM

## 2023-07-29 ENCOUNTER — Other Ambulatory Visit: Payer: Self-pay | Admitting: Nurse Practitioner

## 2023-07-29 DIAGNOSIS — E1165 Type 2 diabetes mellitus with hyperglycemia: Secondary | ICD-10-CM

## 2023-07-31 ENCOUNTER — Other Ambulatory Visit: Payer: Self-pay | Admitting: Psychiatry

## 2023-07-31 ENCOUNTER — Other Ambulatory Visit: Payer: Self-pay | Admitting: Nurse Practitioner

## 2023-07-31 DIAGNOSIS — F411 Generalized anxiety disorder: Secondary | ICD-10-CM

## 2023-07-31 DIAGNOSIS — F3176 Bipolar disorder, in full remission, most recent episode depressed: Secondary | ICD-10-CM

## 2023-07-31 DIAGNOSIS — E1165 Type 2 diabetes mellitus with hyperglycemia: Secondary | ICD-10-CM

## 2023-07-31 DIAGNOSIS — I10 Essential (primary) hypertension: Secondary | ICD-10-CM

## 2023-07-31 DIAGNOSIS — F4310 Post-traumatic stress disorder, unspecified: Secondary | ICD-10-CM

## 2023-08-01 NOTE — Telephone Encounter (Signed)
Requested Prescriptions  Pending Prescriptions Disp Refills   lisinopril (ZESTRIL) 20 MG tablet [Pharmacy Med Name: LISINOPRIL 20 MG TABLET 20 Tablet] 30 tablet 3    Sig: TAKE 1 TABLET BY MOUTH ONCE DAILY     Cardiovascular:  ACE Inhibitors Passed - 07/31/2023  7:06 PM      Passed - Cr in normal range and within 180 days    Creat  Date Value Ref Range Status  05/27/2022 2.10 (H) 0.50 - 0.99 mg/dL Final   Creatinine, Ser  Date Value Ref Range Status  06/03/2023 0.93 0.44 - 1.00 mg/dL Final   Creatinine, Urine  Date Value Ref Range Status  06/05/2020 113 20 - 275 mg/dL Final         Passed - K in normal range and within 180 days    Potassium  Date Value Ref Range Status  06/03/2023 4.7 3.5 - 5.1 mmol/L Final         Passed - Patient is not pregnant      Passed - Last BP in normal range    BP Readings from Last 1 Encounters:  06/04/23 (!) 124/92         Passed - Valid encounter within last 6 months    Recent Outpatient Visits           4 months ago Sepsis without acute organ dysfunction, due to unspecified organism [facility])   [facility] [name], FNP   6 months ago Essential hypertension   [facility] [name], FNP   1 year ago Essential hypertension   [facility] [name], FNP   1 year ago Essential hypertension   [facility] Della Goo F, FNP   1 year ago Migraine with aura and without status migrainosus, not intractable   [facility] [name], [place]

## 2023-08-05 ENCOUNTER — Other Ambulatory Visit: Payer: Self-pay | Admitting: Obstetrics and Gynecology

## 2023-08-05 NOTE — Patient Instructions (Signed)
Hi Ms. Pelc, I hope you feel better-have a good afternoon!  Ms. Kusz was given information about Medicaid Managed Care team care coordination services as a part of their Healthy [facility] Medicaid benefit. [name] verbally consented to engagement with the [facility] Managed Care team.   If you are experiencing a medical emergency, please call 911 or report to your local emergency department or urgent care.   If you have a non-emergency medical problem during routine business hours, please contact your provider's office and ask to speak with a nurse.   For questions related to your Healthy [facility] health plan, please call: [phone] or visit the homepage here: [email or web address]  If you would like to schedule transportation through your Healthy [facility] plan, please call the following number at least 2 days in advance of your appointment: [phone]  For information about your ride after you set it up, call Ride Assist at [phone]. Use this number to activate a Will Call pickup, or if your transportation is late for a scheduled pickup. Use this number, too, if you need to make a change or cancel a previously scheduled reservation.  If you need transportation services right away, call [phone]. The after-hours call center is staffed 24 hours to handle ride assistance and urgent reservation requests (including discharges) 365 days a year. Urgent trips include sick visits, hospital discharge requests and life-sustaining treatment.  Call the [facility] Line at [phone], at any time, 24 hours a day, 7 days a week. If you are in danger or need immediate medical attention call 911.  If you would like help to quit smoking, call 1-800-QUIT-NOW ((865)052-5610) OR Espaol: 1-855-Djelo-Ya (4-332-951-8841) o para ms informacin haga clic aqu or Text READY to 660-630 to register via text  Ms. Ruef - following are the  goals we discussed in your visit today:   Goals Addressed             This Visit's Progress    Chronic Pain Managed       08/05/23:  Body aches with weather change-taking pain medicine as needed    Get sugars at goal       Timeframe:  Short-Term Goal Priority:  High Start Date:                             Expected End Date:      ongoing                 Follow Up Date: 09/05/23   - call for medicine refill 2 or 3 days before it runs out - call if I am sick and can't take my medicine - keep a list of all the medicines I take; vitamins and herbals too    Why is this important?   These steps will help you keep on track with your medicines. 08/05/23:  patient to schedule  an appt with ENDO-states she will f/u.  Blood sugars low 200s per patient.   Patient verbalizes understanding of instructions and care plan provided today and agrees to view in MyChart. Active MyChart status and patient understanding of how to access instructions and care plan via MyChart confirmed with patient.     The Managed Medicaid care management team will reach out to the patient again over the next 30 business  days.  The  Patient has been provided with contact information for the Managed Medicaid care management team and has been  advised to call with any health related questions or concerns.   Kathi Der RN, [name] [place]  [facility] Care Management Coordinator - Managed Medicaid High Risk [phone]   Following is a copy of your plan of care:  Care Plan : Chronic Pain (Adult)  Updates made by [name], RN since 08/05/2023 12:00 AM     Problem: Chronic Pain Management-fibromyalgia   Priority: High  Onset Date: 01/22/2021     Long-Range Goal: Fibromyalgia pain managed-new pain management provider   Start Date: 08/14/2020  Expected End Date: 09/05/2023  Recent Progress: On track  Priority: High  Note:   Current Barriers:  Knowledge deficits related to pain management   08/05/23:  patient  with body aches today r/t weather change-taking pain medication as needed.  Medications now being received from [facility]-adjusting to new packing system.    Nurse Case Manager Clinical Goal(s):  Over the next 45 days, patient will work with [facility] to address needs related to referral for pain management provider and associated care coordination needs. 05/24/22:  patient is currently seeing Dr. Welton Flakes at [facility].  Interventions:  Inter-disciplinary care team collaboration (see longitudinal plan of care) Evaluation of current treatment plan related to fibromyalgia  and patient's adherence to plan as established by provider. Update 06/15/21:  Patient taking Baclofen again, Effexor added. Collaborated with primary care provider regarding recommendations and referral to pain management provider. Discussed plans with patient for ongoing care management follow up and provided patient with direct contact information for care management team Anticipate pain education program, pain management support as part of pain management referral.       Patient Goals/Self-Care Activities Over the next 45 days, patient will:  -Attends all scheduled provider appointments  develop a personal pain management plan with your pain management provider. Patient will f/u with Dr. Welton Flakes and insurance regarding auth.  Follow Up Plan:  The Managed Medicaid care management team will reach out to the patient again over the next 60 business days.

## 2023-08-05 NOTE — Patient Outreach (Signed)
Medicaid Managed Care   Nurse Care Manager Note  08/05/2023 Name:  Alison [name] MRN:  [id number] DOB:  1976-06-29  Alison [name] is an 47 y.o. year old female who is a primary patient of Alison Salines, FNP.  [facility] Managed Care Coordination team was consulted for assistance with:    Chronic healthcare management needs, HTN, DM, chronic pain, anxiety/depression/BP/PTSD, OSD, migraines, DLBCL, lymphoma, SLE, LBP, LDD, HLD, fibromyalgia, osteoarthritis, liver transplant  [name] was given information about Medicaid Managed Care Coordination team services today. [name] Patient agreed to services and verbal consent obtained.  Engaged with patient by telephone for follow up visit in response to provider referral for case management and/or care coordination services.   Assessments/Interventions:  Review of past medical history, allergies, medications, health status, including review of consultants reports, laboratory and other test data, was performed as part of comprehensive evaluation and provision of chronic care management services.  SDOH (Social Determinants of Health) assessments and interventions performed: SDOH Interventions    Flowsheet Row Patient Outreach Telephone from 08/05/2023 in [place] [facility] DEPARTMENT Patient Outreach Telephone from 06/05/2023 in [place] POPULATION HEALTH DEPARTMENT Patient Outreach Telephone from 04/29/2023 in [place] POPULATION HEALTH DEPARTMENT Patient Outreach Telephone from 03/25/2023 in [place] POPULATION HEALTH DEPARTMENT Patient Outreach Telephone from 01/10/2023 in  POPULATION HEALTH DEPARTMENT Patient Outreach Telephone from 12/11/2022 in  POPULATION HEALTH DEPARTMENT  SDOH Interventions        Food Insecurity Interventions Intervention Not Indicated -- -- -- Intervention Not Indicated --  Housing Interventions Intervention Not Indicated -- -- -- -- Intervention Not Indicated   Transportation Interventions -- -- -- -- Intervention Not Indicated --  Utilities Interventions -- Intervention Not Indicated -- -- -- Intervention Not Indicated  Financial Strain Interventions -- -- -- Intervention Not Indicated -- --  Physical Activity Interventions -- -- -- Other (Comments)  [not able to enage in moderate to strenuous exercise at this level] -- --  Stress Interventions -- -- Provide Counseling  [followed by Psychiatry] -- -- --  Social Connections Interventions -- Intervention Not Indicated -- -- -- --  Health Literacy Interventions -- -- Intervention Not Indicated -- -- --     Care Plan Allergies  Allergen Reactions   Penicillin G Itching, Rash, Anaphylaxis and Palpitations   Lamictal [Lamotrigine] Rash   Tape    Carbamazepine Other (See Comments)    Medication interaction-prograf    Hydrocodone-Acetaminophen Itching   Naproxen Itching   Penicillins    Vancomycin Rash    Macular/blotchy rash at infusion site   Medications Reviewed Today   Medications were not reviewed in this encounter     Patient Active Problem List   Diagnosis Date Noted   Acute idiopathic pericarditis 03/09/2023   Acute pericarditis 03/08/2023   Sepsis (HCC) 03/06/2023   Chest pain 03/06/2023   CKD (chronic kidney disease), stage II 01/22/2023   Insulin dependent type 2 diabetes mellitus (HCC) 01/22/2023   Overactive bladder 01/22/2023   High risk medication use 12/12/2022   Sjogren's syndrome (HCC) 05/27/2022   Menorrhagia with irregular cycle    Akathisia 10/24/2021   At risk for prolonged QT interval syndrome 09/12/2021   Prolonged Q-T interval on ECG 08/29/2021   Insomnia 07/20/2021   Bipolar disorder, in full remission, most recent episode mixed (HCC) 07/20/2021   GAD (generalized anxiety disorder) 05/09/2021   Opioid dependence with opioid-induced disorder (HCC) 12/20/2020   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  Carpal tunnel syndrome, left 07/26/2019    Cubital tunnel syndrome on left 07/26/2019   Umbilical hernia without obstruction and without gangrene 06/17/2019   Lumbar spondylosis 06/02/2019   Obesity (BMI 35.0-39.9 without comorbidity) 06/02/2019   Goals of care, counseling/discussion 05/18/2019   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   Fibromyalgia 03/23/2019   Systemic lupus erythematosus (SLE) in adult [facility]) 03/23/2019   Essential hypertension 03/23/2019   Mass of left kidney 03/23/2019   Nausea without vomiting 03/23/2019   Hyperlipidemia 03/23/2019   Osteoarthritis 03/23/2019   Trigeminal neuralgia 03/23/2019   Nephrolithiasis 03/23/2019   Migraine 03/23/2019   OSA on CPAP 03/23/2019   Fibrocystic breast 03/23/2019   Heart murmur 03/23/2019   Major depressive disorder, recurrent (HCC) 03/18/2017   Morbid obesity (HCC) 03/18/2017   Neuropathic pain 11/19/2016   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HCC) 01/13/2015   Lumbar disc disease with radiculopathy 07/26/2013   S/P liver transplant (HCC) 07/26/2013   Facial nerve disorder 06/29/2012   Low back pain 12/26/2010   Conditions to be addressed/monitored per PCP order:  Chronic healthcare management needs, HTN, DM, chronic pain, anxiety/depression/BP/PTSD, OSD, migraines, DLBCL, lymphoma, SLE, LBP, LDD, HLD, fibromyalgia, osteoarthritis, liver transplant  Care Plan : Chronic Pain (Adult)  Updates made by [name], RN since 08/05/2023 12:00 AM     Problem: Chronic Pain Management-fibromyalgia   Priority: High  Onset Date: 01/22/2021     Long-Range Goal: Fibromyalgia pain managed-new pain management provider   Start Date: 08/14/2020  Expected End Date: 09/05/2023  Recent Progress: On track  Priority: High  Note:   Current Barriers:  Knowledge deficits related to pain management  08/05/23:  patient  with body aches today r/t weather change-taking pain medication as needed.  Medications now being received from [facility]-adjusting to new  packing system.    Nurse Case Manager Clinical Goal(s):  Over the next 45 days, patient will work with [facility] to address needs related to referral for pain management provider and associated care coordination needs. 05/24/22:  patient is currently seeing Dr. Welton Flakes at [facility].  Interventions:  Inter-disciplinary care team collaboration (see longitudinal plan of care) Evaluation of current treatment plan related to fibromyalgia  and patient's adherence to plan as established by provider. Update 06/15/21:  Patient taking Baclofen again, Effexor added. Collaborated with primary care provider regarding recommendations and referral to pain management provider. Discussed plans with patient for ongoing care management follow up and provided patient with direct contact information for care management team Anticipate pain education program, pain management support as part of pain management referral.       Patient Goals/Self-Care Activities Over the next 45 days, patient will:  -Attends all scheduled provider appointments  develop a personal pain management plan with your pain management provider. Patient will f/u with Dr. Welton Flakes and insurance regarding auth.  Follow Up Plan:  The Managed Medicaid care management team will reach out to the patient again over the next 60 business days.    Follow Up:  Patient agrees to Care Plan and Follow-up.  Plan: The Managed Medicaid care management team will reach out to the patient again over the next 30 business  days. and The  Patient has been provided with contact information for the Managed Medicaid care management team and has been advised to call with any health related questions or concerns.  Date/time of next scheduled RN care management/care coordination outreach: 09/05/23 at 230

## 2023-08-06 DIAGNOSIS — F4310 Post-traumatic stress disorder, unspecified: Secondary | ICD-10-CM | POA: Diagnosis not present

## 2023-08-06 DIAGNOSIS — F411 Generalized anxiety disorder: Secondary | ICD-10-CM | POA: Diagnosis not present

## 2023-08-06 DIAGNOSIS — F3181 Bipolar II disorder: Secondary | ICD-10-CM | POA: Diagnosis not present

## 2023-08-18 DIAGNOSIS — I10 Essential (primary) hypertension: Secondary | ICD-10-CM | POA: Diagnosis not present

## 2023-08-18 DIAGNOSIS — M79606 Pain in leg, unspecified: Secondary | ICD-10-CM | POA: Diagnosis not present

## 2023-08-18 DIAGNOSIS — M25519 Pain in unspecified shoulder: Secondary | ICD-10-CM | POA: Diagnosis not present

## 2023-08-18 DIAGNOSIS — G893 Neoplasm related pain (acute) (chronic): Secondary | ICD-10-CM | POA: Diagnosis not present

## 2023-08-18 DIAGNOSIS — Z79891 Long term (current) use of opiate analgesic: Secondary | ICD-10-CM | POA: Diagnosis not present

## 2023-08-18 DIAGNOSIS — M5442 Lumbago with sciatica, left side: Secondary | ICD-10-CM | POA: Diagnosis not present

## 2023-08-18 DIAGNOSIS — M5414 Radiculopathy, thoracic region: Secondary | ICD-10-CM | POA: Diagnosis not present

## 2023-08-18 DIAGNOSIS — G894 Chronic pain syndrome: Secondary | ICD-10-CM | POA: Diagnosis not present

## 2023-08-18 DIAGNOSIS — M79669 Pain in unspecified lower leg: Secondary | ICD-10-CM | POA: Diagnosis not present

## 2023-08-18 DIAGNOSIS — M79603 Pain in arm, unspecified: Secondary | ICD-10-CM | POA: Diagnosis not present

## 2023-08-18 DIAGNOSIS — F3189 Other bipolar disorder: Secondary | ICD-10-CM | POA: Diagnosis not present

## 2023-08-21 ENCOUNTER — Other Ambulatory Visit: Payer: Self-pay | Admitting: *Deleted

## 2023-08-21 DIAGNOSIS — E1165 Type 2 diabetes mellitus with hyperglycemia: Secondary | ICD-10-CM

## 2023-08-21 MED ORDER — ROSUVASTATIN CALCIUM 10 MG PO TABS
10.0000 mg | ORAL_TABLET | Freq: Every day | ORAL | 2 refills | Status: DC
Start: 2023-08-21 — End: 2024-05-31

## 2023-08-28 DIAGNOSIS — F331 Major depressive disorder, recurrent, moderate: Secondary | ICD-10-CM | POA: Diagnosis not present

## 2023-08-28 DIAGNOSIS — F411 Generalized anxiety disorder: Secondary | ICD-10-CM | POA: Diagnosis not present

## 2023-08-28 DIAGNOSIS — F4310 Post-traumatic stress disorder, unspecified: Secondary | ICD-10-CM | POA: Diagnosis not present

## 2023-09-02 ENCOUNTER — Inpatient Hospital Stay: Payer: Medicaid Other

## 2023-09-05 ENCOUNTER — Other Ambulatory Visit: Payer: Self-pay | Admitting: Obstetrics and Gynecology

## 2023-09-05 NOTE — Patient Outreach (Signed)
Medicaid Managed Care   Nurse Care Manager Note  09/05/2023 Name:  Alison [name] MRN:  [id number] DOB:  August 08, 1976  Alison [name] is an 47 y.o. year old female who is a primary patient of Alison Salines, FNP.  The [facility] Managed Care Coordination team was consulted for assistance with:    Chronic healthcare management needs, HTN, DM, chronic pain, anxiety/depression/BP/PTSD, OSD, migraines, DLBCL, lymphoma, SLE, LBP, LDD, HLD, fibromyalagia, osteoarthritis, liver transplant  [name] was given information about Medicaid Managed Care Coordination team services today. Alison [name] Patient agreed to services and verbal consent obtained.  Engaged with patient by telephone for follow up visit in response to provider referral for case management and/or care coordination services.   Assessments/Interventions:  Review of past medical history, allergies, medications, health status, including review of consultants reports, laboratory and other test data, was performed as part of comprehensive evaluation and provision of chronic care management services.  SDOH (Social Determinants of Health) assessments and interventions performed: SDOH Interventions    Flowsheet Row Patient Outreach Telephone from 09/05/2023 in [place] POPULATION HEALTH DEPARTMENT Patient Outreach Telephone from 08/05/2023 in [place] POPULATION HEALTH DEPARTMENT Patient Outreach Telephone from 06/05/2023 in [place] POPULATION HEALTH DEPARTMENT Patient Outreach Telephone from 04/29/2023 in [place] POPULATION HEALTH DEPARTMENT Patient Outreach Telephone from 03/25/2023 in [place] POPULATION HEALTH DEPARTMENT Patient Outreach Telephone from 01/10/2023 in [place] POPULATION HEALTH DEPARTMENT  SDOH Interventions        Food Insecurity Interventions -- Intervention Not Indicated -- -- -- Intervention Not Indicated  Housing Interventions -- Intervention Not Indicated -- -- -- --  Transportation  Interventions Intervention Not Indicated -- -- -- -- Intervention Not Indicated  Utilities Interventions -- -- Intervention Not Indicated -- -- --  Financial Strain Interventions -- -- -- -- Intervention Not Indicated --  Physical Activity Interventions -- -- -- -- Other (Comments)  [not able to enage in moderate to strenuous exercise at this level] --  Stress Interventions -- -- -- Provide Counseling  [followed by Psychiatry] -- --  Social Connections Interventions -- -- Intervention Not Indicated -- -- --  Health Literacy Interventions -- -- -- Intervention Not Indicated -- --     Care Plan Allergies  Allergen Reactions   Penicillin G Itching, Rash, Anaphylaxis and Palpitations   Lamictal [Lamotrigine] Rash   Tape    Carbamazepine Other (See Comments)    Medication interaction-prograf    Hydrocodone-Acetaminophen Itching   Naproxen Itching   Penicillins    Vancomycin Rash    Macular/blotchy rash at infusion site   Medications Reviewed Today     Reviewed by [name], RN (Registered Nurse) on 09/05/23 at 1109  Med List Status: <None>   Medication Order Taking? Sig Documenting Provider Last Dose Status Informant  baclofen (LIORESAL) 10 MG tablet [id number] No Take 1 tablet by mouth 2 (two) times daily. [provider] Taking Active Self, Pharmacy Records  benztropine (COGENTIN) 1 MG tablet [id number]  TAKE 1 TABLET BY MOUTH ONCE DAILY AS NEEDED FOR TREMORS [name], [name], MD  Active   Cholecalciferol (VITAMIN D) 125 MCG (5000 UT) CAPS [id number] No Take 5,000 Units by mouth daily. [provider] Taking Active Self, Pharmacy Records  dronabinol (MARINOL) 2.5 MG capsule [id number] No Take 2.5 mg by mouth See admin instructions. Takes every other day  Patient not taking: Reported on 06/04/2023   [provider] Not Taking Active Self, Pharmacy Records  Dulaglutide 4.5 MG/0.5ML [facility] [id number]  No Inject 4.5 mg into the skin once a week. [provider] Taking Active Self, Pharmacy Records  ezetimibe (ZETIA) 10 MG tablet [id number] No Take 10 mg by mouth every morning. [provider] Taking Active Self, Pharmacy Records  ibuprofen (ADVIL) 400 MG tablet [id number] No Take 1 tablet (400 mg total) by mouth every 6 (six) hours as needed. Marcelino Duster, MD Taking Active   icosapent Ethyl (VASCEPA) 1 g capsule [id number] No Take 2 capsules (2 g total) by mouth 2 (two) times daily. Debbe Odea, MD Taking Active Self, Pharmacy Records           Med Note [place], Alison T   Tue Mar 19, 2022 10:24 AM) Taking 1 capsule twice daily per Dr. Patrecia Pace  Insulin Degludec (TRESIBA) 100 UNIT/ML SOLN [id number] No Inject 30 Units into the skin. [provider] Taking Active Self, Pharmacy Records  Insulin Glargine [facility]) 100 UNIT/ML [id number] No Inject 40 Units into the skin at bedtime. [provider] Taking Active Self, Pharmacy Records  Insulin Pen Needle 32G X 6 MM MISC [id number] No 1 each by Does not apply route as needed. Alison Salines, FNP Taking Active   lisinopril (ZESTRIL) 20 MG tablet [id number]  TAKE 1 TABLET BY MOUTH ONCE DAILY Alison Salines, FNP  Active   lurasidone (LATUDA) 20 MG TABS tablet [id number] No TAKE 1 TABLET BY MOUTH ONCE DAILY WITH SUPPER *REFILL REQUESTJomarie Longs, MD Taking Active   magnesium oxide (MAG-OX) 400 MG tablet [id number] No Take by mouth. [provider] Taking Active Self, Pharmacy Records  Melatonin 10 MG TABS [id number] No Take 10 mg by mouth at bedtime. [provider] Taking Active Self, Pharmacy Records  meloxicam (MOBIC) 15 MG tablet [id number] No Take 1 tablet (15 mg total) by mouth daily. Cuthriell, [name], PA-C Taking Active Self, Pharmacy Records  metFORMIN (GLUCOPHAGE) 1000 MG tablet [id number] No Take 1 tablet (1,000 mg total) by mouth 2 (two) times daily with a meal. Alison Salines, FNP Taking Active Self, Pharmacy Records   methocarbamol (ROBAXIN) 500 MG tablet [id number] No Take 1 tablet (500 mg total) by mouth 4 (four) times daily. Cuthriell, [name], PA-C Taking Active Self, Pharmacy Records  mirabegron ER (MYRBETRIQ) 50 MG TB24 tablet [id number] No Take 1 tablet (50 mg total) by mouth daily. Alison Salines, FNP Taking Active Self, Pharmacy Records  mirtazapine (REMERON) 15 MG tablet [id number] No TAKE 1 TABLET BY MOUTH EVERY DAY AT BEDTIME *REFILL REQUESTJomarie Longs, MD Taking Active   naloxone [facility]) nasal spray 4 mg/0.1 mL [id number] No Place 0.4 mg into the nose once. [provider] Taking Active Self, Pharmacy Records  NOVOLOG FLEXPEN 100 UNIT/ML FlexPen [id number] No Inject 0-30 Units into the skin 3 (three) times daily with meals. Sliding scale [provider] Taking Active Self, Pharmacy Records  oxyCODONE (OXY IR/ROXICODONE) 5 MG immediate release tablet [id number] No Take 5 mg by mouth 3 (three) times daily as needed. [provider] Taking Active   PRECISION QID TEST test strip [id number] No  [provider] Taking Active Self, Pharmacy Records  pregabalin (LYRICA) 200 MG capsule [id number] No Take 1 capsule (200 mg total) by mouth 2 (two) times daily. Phineas Semen, MD Taking Active Self, Pharmacy Records  rosuvastatin (CRESTOR) 10 MG tablet [id number]  Take 1 tablet (10 mg total) by mouth daily. Debbe Odea, MD  Active   tacrolimus (PROGRAF) 1 MG capsule [id number] No Take 1 capsule by mouth  every morning. [provider] Taking Active Self, Pharmacy Records  UBRELVY 50 MG TABS [id number] No Take 1 tablet by mouth daily as needed. [provider] Taking Active Self, Pharmacy Records  venlafaxine XR (EFFEXOR-XR) 37.5 MG 24 hr capsule [id number] No TAKE 1 CAPSULE BY MOUTH DAILY WITH BREAKFAST ALONG WITH 75MG  DAILY *REFILL REQUEST* [name], [name], MD Taking Active   venlafaxine XR (EFFEXOR-XR) 75 MG 24 hr capsule [id number] No TAKE 1 CAPSULE BY  MOUTH ONCE DAILY WITH BREAKFAST ALONG WITH 37.5MG  TABLET *REFILL REQUESTJomarie Longs, MD Taking Active   XTAMPZA ER 9 MG C12A [id number] No Take 1 capsule by mouth every 12 (twelve) hours.  Patient not taking: Reported on 06/04/2023   [provider] Not Taking Active Self, Pharmacy Records           Patient Active Problem List   Diagnosis Date Noted   Acute idiopathic pericarditis 03/09/2023   Acute pericarditis 03/08/2023   Sepsis (HCC) 03/06/2023   Chest pain 03/06/2023   CKD (chronic kidney disease), stage II 01/22/2023   Insulin dependent type 2 diabetes mellitus (HCC) 01/22/2023   Overactive bladder 01/22/2023   High risk medication use 12/12/2022   Sjogren's syndrome (HCC) 05/27/2022   Menorrhagia with irregular cycle    Akathisia 10/24/2021   At risk for prolonged QT interval syndrome 09/12/2021   Prolonged Q-T interval on ECG 08/29/2021   Insomnia 07/20/2021   Bipolar disorder, in full remission, most recent episode mixed (HCC) 07/20/2021   GAD (generalized anxiety disorder) 05/09/2021   Opioid dependence with opioid-induced disorder (HCC) 12/20/2020   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   Carpal tunnel syndrome, left 07/26/2019   Cubital tunnel syndrome on left 07/26/2019   Umbilical hernia without obstruction and without gangrene 06/17/2019   Lumbar spondylosis 06/02/2019   Obesity (BMI 35.0-39.9 without comorbidity) 06/02/2019   Goals of care, counseling/discussion 05/18/2019   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   Fibromyalgia 03/23/2019   Systemic lupus erythematosus (SLE) in adult [facility]) 03/23/2019   Essential hypertension 03/23/2019   Mass of left kidney 03/23/2019   Nausea without vomiting 03/23/2019   Hyperlipidemia 03/23/2019   Osteoarthritis 03/23/2019   Trigeminal neuralgia 03/23/2019   Nephrolithiasis 03/23/2019   Migraine 03/23/2019   OSA on CPAP 03/23/2019   Fibrocystic breast 03/23/2019   Heart murmur  03/23/2019   Major depressive disorder, recurrent (HCC) 03/18/2017   Morbid obesity (HCC) 03/18/2017   Neuropathic pain 11/19/2016   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HCC) 01/13/2015   Lumbar disc disease with radiculopathy 07/26/2013   S/P liver transplant (HCC) 07/26/2013   Facial nerve disorder 06/29/2012   Low back pain 12/26/2010   Conditions to be addressed/monitored per PCP order:  Chronic healthcare management needs, HTN, DM, chronic pain, anxiety/depression/BP/PTSD, OSD, migraines, DLBCL, lymphoma, SLE, LBP, LDD, HLD, fibromyalagia, osteoarthritis, liver transplant  Care Plan : Chronic Pain (Adult)  Updates made by [name], RN since 09/05/2023 12:00 AM     Problem: Chronic Pain Management-fibromyalgia   Priority: High  Onset Date: 01/22/2021     Long-Range Goal: Fibromyalgia pain managed-new pain management provider   Start Date: 08/14/2020  Expected End Date: 12/05/2023  Recent Progress: On track  Priority: High  Note:   Current Barriers:  Knowledge deficits related to pain management  09/05/23:  No complaints today, recovering from a cold.  Nurse Case Manager Clinical Goal(s):  Over the next 45 days, patient will work with [facility] to address needs related to referral for  pain management provider and associated care coordination needs. 05/24/22:  patient is currently seeing Dr. Welton Flakes at [facility].  Interventions:  Inter-disciplinary care team collaboration (see longitudinal plan of care) Evaluation of current treatment plan related to fibromyalgia  and patient's adherence to plan as established by provider. Update 06/15/21:  Patient taking Baclofen again, Effexor added. Collaborated with primary care provider regarding recommendations and referral to pain management provider. Discussed plans with patient for ongoing care management follow up and provided patient with direct contact information for care management team Anticipate pain  education program, pain management support as part of pain management referral.       Patient Goals/Self-Care Activities Over the next 45 days, patient will:  -Attends all scheduled provider appointments  develop a personal pain management plan with your pain management provider. Patient will f/u with Dr. Welton Flakes and insurance regarding auth.  Follow Up Plan:  The Managed Medicaid care management team will reach out to the patient again over the next 30 business days.    Care Plan : Wellness (Adult)  Updates made by [name], RN since 09/05/2023 12:00 AM     Problem: Medication Adherence (Wellness)   Priority: High  Onset Date: 01/22/2021     Long-Range Goal: Medication Adherence Maintained   Start Date: 09/06/2020  Expected End Date: 12/05/2023  Recent Progress: On track  Priority: High  Note:   Current Barriers:  Chronic Disease Management of chronic health conditions, OSD, migraines, DM, HTN, chronic pain, anxiety/depression/ Bipolar/PTSD, SLE, LDD, HLD, fibromyalgia, osteoarthritis, LBP, DLBCL, liver transplant, lymphoma 09/05/23:  Recovering from cold, COVID test negative.  To follow up with provider if not feeling better.  Blood sugars 200-250.  Does not check BP.    Nurse Case Manager Clinical Goal(s):  Over the next 30 days, patient will work with CM team pharmacist to review current medications. Over the next 30 days, patient will meet with Nutritionist and check her blood sugars. Over the next 30 days, patient will attend all scheduled appointments.  Interventions:  Inter-disciplinary care team collaboration (see longitudinal plan of care) Evaluation of current treatment plan and patient's adherence to plan as established by provider. Advised patient to contact her PCP for any medication needs. Reviewed medications with patient. Collaborated with pharmacy regarding medications.  Discussed plans with patient for ongoing care management follow up and provided patient  with direct contact information for care management team Pharmacy referral for medication review. Patient given phone number for [facility] transportation if needed. Will notify PCP of need for testing strips and dietician referral. Collaborated with PCP for cardiology appointment. Collaborated with [name] for dental resources. [name] referral for dental resources-completed.  Hyperlipidemia Interventions:  (Status:  New goal.) Long Term Goal Medication review performed; medication list updated in electronic medical record.  Provider established cholesterol goals reviewed Counseled on importance of regular laboratory monitoring as prescribed Reviewed importance of limiting foods high in cholesterol Reviewed exercise goals and target of 150 minutes per week Screening for signs and symptoms of depression related to chronic disease state Assessed social determinant of health barriers    Diabetes Interventions:  (Status:  New goal.) Long Term Goal Assessed patient's understanding of A1c goal: <7% Reviewed medications with patient and discussed importance of medication adherence Counseled on importance of regular laboratory monitoring as prescribed Discussed plans with patient for ongoing care management follow up and provided patient with direct contact information for care management team Reviewed scheduled/upcoming provider appointments Advised  patient, providing education and rationale, to check cbg as directed and record, calling provider for findings outside established parameters Review of patient status, including review of consultants reports, relevant laboratory and other test results, and medications completed Assessed social determinant of health barriers Lab Results  Component Value Date   HGBA1C 6.4 03/06/2022             HGBA1C                                                      8.8                                    03/19/223  Hyperlipidemia Interventions:   (Status:  New goal.) Long Term Goal Medication review performed; medication list updated in electronic medical record.  Provider established cholesterol goals reviewed Reviewed importance of limiting foods high in cholesterol Assessed social determinant of health barriers   Hypertension Interventions:  (Status:  New goal.) Long Term Goal Last practice recorded BP readings:  BP Readings from Last 3 Encounters:   03/19/23        124/72 04/29/23        120/68 06/04/23        124/92  Most recent eGFR/CrCl:  Lab Results  Component Value Date   EGFR 29 (L) 05/27/2022    No components found for: "CRCL"  Evaluation of current treatment plan related to hypertension self management and patient's adherence to plan as established by provider Reviewed medications with patient and discussed importance of compliance Discussed plans with patient for ongoing care management follow up and provided patient with direct contact information for care management team Advised patient, providing education and rationale, to monitor blood pressure daily and record, calling PCP for findings outside established parameters Reviewed scheduled/upcoming provider appointments including:  Provided education on prescribed diet Discussed complications of poorly controlled blood pressure such as heart disease, stroke, circulatory complications, vision complications, kidney impairment, sexual dysfunction Assessed social determinant of health barriers  Pain Interventions:  (Status:  New goal.) Long Term Goal Pain assessment performed Medications reviewed Reviewed provider established plan for pain management Discussed importance of adherence to all scheduled medical appointments Counseled on the importance of reporting any/all new or changed pain symptoms or management strategies to pain management provider Advised patient to report to care team affect of pain on daily activities Discussed use of relaxation techniques  and/or diversional activities to assist with pain reduction (distraction, imagery, relaxation, massage, acupressure, TENS, heat, and cold application Reviewed with patient prescribed pharmacological and nonpharmacological pain relief strategies Assessed social determinant of health barriers   Patient Goals/Self-Care Activities Over the next 30 days, patient will:  -Patient will take medications as prescribed. Calls pharmacy for medication refills Calls provider office for new concerns or questions Patient to schedule an appt with DERM,  ENDO  Follow Up Plan: The Managed Medicaid care management team will reach out to the patient again over the next 30 business days.  The patient has been provided with contact information for the Managed Medicaid care management team and has been advised to call with any health related questions or concerns.    Follow Up:  Patient agrees to Care Plan and Follow-up.  Plan: The Managed Medicaid care  management team will reach out to the patient again over the next 30 business  days. and The  Patient has been provided with contact information for the Managed Medicaid care management team and has been advised to call with any health related questions or concerns.  Date/time of next scheduled RN care management/care coordination outreach: 10/07/23 at 1230

## 2023-09-05 NOTE — Patient Instructions (Signed)
Hi Alison [name], thanks for speaking with me-I hope you feel better soon-have a nice day and weekend!  Alison [name] was given information about Medicaid Managed Care team care coordination services as a part of their Healthy [facility] Medicaid benefit. Alison [name] verbally consented to engagement with the [facility] Managed Care team.   If you are experiencing a medical emergency, please call 911 or report to your local emergency department or urgent care.   If you have a non-emergency medical problem during routine business hours, please contact your provider's office and ask to speak with a nurse.   For questions related to your Healthy [facility] health plan, please call: [phone] or visit the homepage here: [email or web address]  If you would like to schedule transportation through your Healthy [facility] plan, please call the following number at least 2 days in advance of your appointment: [phone]  For information about your ride after you set it up, call Ride Assist at [phone]. Use this number to activate a Will Call pickup, or if your transportation is late for a scheduled pickup. Use this number, too, if you need to make a change or cancel a previously scheduled reservation.  If you need transportation services right away, call [phone]. The after-hours call center is staffed 24 hours to handle ride assistance and urgent reservation requests (including discharges) 365 days a year. Urgent trips include sick visits, hospital discharge requests and life-sustaining treatment.  Call the [facility] Line at [phone], at any time, 24 hours a day, 7 days a week. If you are in danger or need immediate medical attention call 911.  If you would like help to quit smoking, call 1-800-QUIT-NOW (4383916216) OR Espaol: 1-855-Djelo-Ya (0-623-762-8315) o para ms informacin haga clic aqu or Text READY to 176-160 to register via  text  [name] - following are the goals we discussed in your visit today:   Goals Addressed             This Visit's Progress    Chronic Pain Managed       09/05/23:  No complaints today    Get sugars at goal       Timeframe:  Short-Term Goal Priority:  High Start Date:                             Expected End Date:      ongoing                 Follow Up Date: 10/07/23   - call for medicine refill 2 or 3 days before it runs out - call if I am sick and can't take my medicine - keep a list of all the medicines I take; vitamins and herbals too    Why is this important?   These steps will help you keep on track with your medicines. 09/05/23:  Blood sugars  200-250   Patient verbalizes understanding of instructions and care plan provided today and agrees to view in MyChart. Active MyChart status and patient understanding of how to access instructions and care plan via MyChart confirmed with patient.     The Managed Medicaid care management team will reach out to the patient again over the next 30 business  days.  The  Patient has been provided with contact information for the Managed Medicaid care management team and has been advised to call with any health related questions or concerns.   Alison [name]  [name] RN, [name] [name]  [facility] Care Management Coordinator - Managed [place] High Risk [phone]   Following is a copy of your plan of care:  Care Plan : Chronic Pain (Adult)  Updates made by Alison Chandler, RN since 09/05/2023 12:00 AM     Problem: Chronic Pain Management-fibromyalgia   Priority: High  Onset Date: 01/22/2021     Long-Range Goal: Fibromyalgia pain managed-new pain management provider   Start Date: 08/14/2020  Expected End Date: 12/05/2023  Recent Progress: On track  Priority: High  Note:   Current Barriers:  Knowledge deficits related to pain management  09/05/23:  No complaints today, recovering from a cold.  Nurse Case Manager  Clinical Goal(s):  Over the next 45 days, patient will work with [facility] to address needs related to referral for pain management provider and associated care coordination needs. 05/24/22:  patient is currently seeing Dr. Welton Flakes at [facility].  Interventions:  Inter-disciplinary care team collaboration (see longitudinal plan of care) Evaluation of current treatment plan related to fibromyalgia  and patient's adherence to plan as established by provider. Update 06/15/21:  Patient taking Baclofen again, Effexor added. Collaborated with primary care provider regarding recommendations and referral to pain management provider. Discussed plans with patient for ongoing care management follow up and provided patient with direct contact information for care management team Anticipate pain education program, pain management support as part of pain management referral.       Patient Goals/Self-Care Activities Over the next 45 days, patient will:  -Attends all scheduled provider appointments  develop a personal pain management plan with your pain management provider. Patient will f/u with Dr. Welton Flakes and insurance regarding auth.  Follow Up Plan:  The Managed Medicaid care management team will reach out to the patient again over the next 30 business days.    Care Plan : Wellness (Adult)  Updates made by Alison Chandler, RN since 09/05/2023 12:00 AM     Problem: Medication Adherence (Wellness)   Priority: High  Onset Date: 01/22/2021     Long-Range Goal: Medication Adherence Maintained   Start Date: 09/06/2020  Expected End Date: 12/05/2023  Recent Progress: On track  Priority: High  Note:   Current Barriers:  Chronic Disease Management of chronic health conditions, OSD, migraines, DM, HTN, chronic pain, anxiety/depression/ Bipolar/PTSD, SLE, LDD, HLD, fibromyalgia, osteoarthritis, LBP, DLBCL, liver transplant, lymphoma 09/05/23:  Recovering from cold, COVID test negative.  To follow  up with provider if not feeling better.  Blood sugars 200-250.  Does not check BP.    Nurse Case Manager Clinical Goal(s):  Over the next 30 days, patient will work with CM team pharmacist to review current medications. Over the next 30 days, patient will meet with Nutritionist and check her blood sugars. Over the next 30 days, patient will attend all scheduled appointments.  Interventions:  Inter-disciplinary care team collaboration (see longitudinal plan of care) Evaluation of current treatment plan and patient's adherence to plan as established by provider. Advised patient to contact her PCP for any medication needs. Reviewed medications with patient. Collaborated with pharmacy regarding medications.  Discussed plans with patient for ongoing care management follow up and provided patient with direct contact information for care management team Pharmacy referral for medication review. Patient given phone number for [facility] transportation if needed. Will notify PCP of need for testing strips and dietician referral. Collaborated with PCP for cardiology appointment. Collaborated with [name] for dental resources. [name]  referral for dental resources-completed.  Hyperlipidemia Interventions:  (Status:  New goal.) Long Term Goal Medication review performed; medication list updated in electronic medical record.  Provider established cholesterol goals reviewed Counseled on importance of regular laboratory monitoring as prescribed Reviewed importance of limiting foods high in cholesterol Reviewed exercise goals and target of 150 minutes per week Screening for signs and symptoms of depression related to chronic disease state Assessed social determinant of health barriers    Diabetes Interventions:  (Status:  New goal.) Long Term Goal Assessed patient's understanding of A1c goal: <7% Reviewed medications with patient and discussed importance of medication adherence Counseled  on importance of regular laboratory monitoring as prescribed Discussed plans with patient for ongoing care management follow up and provided patient with direct contact information for care management team Reviewed scheduled/upcoming provider appointments Advised patient, providing education and rationale, to check cbg as directed and record, calling provider for findings outside established parameters Review of patient status, including review of consultants reports, relevant laboratory and other test results, and medications completed Assessed social determinant of health barriers Lab Results  Component Value Date   HGBA1C 6.4 03/06/2022             HGBA1C                                                      8.8                                    03/19/223  Hyperlipidemia Interventions:  (Status:  New goal.) Long Term Goal Medication review performed; medication list updated in electronic medical record.  Provider established cholesterol goals reviewed Reviewed importance of limiting foods high in cholesterol Assessed social determinant of health barriers   Hypertension Interventions:  (Status:  New goal.) Long Term Goal Last practice recorded BP readings:  BP Readings from Last 3 Encounters:   03/19/23        124/72 04/29/23        120/68 06/04/23        124/92  Most recent eGFR/CrCl:  Lab Results  Component Value Date   EGFR 29 (L) 05/27/2022    No components found for: "CRCL"  Evaluation of current treatment plan related to hypertension self management and patient's adherence to plan as established by provider Reviewed medications with patient and discussed importance of compliance Discussed plans with patient for ongoing care management follow up and provided patient with direct contact information for care management team Advised patient, providing education and rationale, to monitor blood pressure daily and record, calling PCP for findings outside established  parameters Reviewed scheduled/upcoming provider appointments including:  Provided education on prescribed diet Discussed complications of poorly controlled blood pressure such as heart disease, stroke, circulatory complications, vision complications, kidney impairment, sexual dysfunction Assessed social determinant of health barriers  Pain Interventions:  (Status:  New goal.) Long Term Goal Pain assessment performed Medications reviewed Reviewed provider established plan for pain management Discussed importance of adherence to all scheduled medical appointments Counseled on the importance of reporting any/all new or changed pain symptoms or management strategies to pain management provider Advised patient to report to care team affect of pain on daily activities Discussed use of relaxation techniques and/or diversional activities to assist  with pain reduction (distraction, imagery, relaxation, massage, acupressure, TENS, heat, and cold application Reviewed with patient prescribed pharmacological and nonpharmacological pain relief strategies Assessed social determinant of health barriers   Patient Goals/Self-Care Activities Over the next 30 days, patient will:  -Patient will take medications as prescribed. Calls pharmacy for medication refills Calls provider office for new concerns or questions Patient to schedule an appt with DERM,  ENDO  Follow Up Plan: The Managed Medicaid care management team will reach out to the patient again over the next 30 business days.  The patient has been provided with contact information for the Managed Medicaid care management team and has been advised to call with any health related questions or concerns.

## 2023-09-08 ENCOUNTER — Encounter: Payer: Self-pay | Admitting: Nurse Practitioner

## 2023-09-08 ENCOUNTER — Ambulatory Visit (INDEPENDENT_AMBULATORY_CARE_PROVIDER_SITE_OTHER): Payer: Medicaid Other | Admitting: Nurse Practitioner

## 2023-09-08 VITALS — BP 118/70 | HR 95 | Temp 98.5°F | Resp 18 | Ht 66.0 in | Wt 255.9 lb

## 2023-09-08 DIAGNOSIS — J0140 Acute pansinusitis, unspecified: Secondary | ICD-10-CM

## 2023-09-08 DIAGNOSIS — J069 Acute upper respiratory infection, unspecified: Secondary | ICD-10-CM

## 2023-09-08 DIAGNOSIS — R062 Wheezing: Secondary | ICD-10-CM

## 2023-09-08 MED ORDER — ALBUTEROL SULFATE HFA 108 (90 BASE) MCG/ACT IN AERS: 2.0000 | INHALATION_SPRAY | Freq: Four times a day (QID) | RESPIRATORY_TRACT | 0 refills | Status: AC | PRN

## 2023-09-08 MED ORDER — BENZONATATE 100 MG PO CAPS: 200.0000 mg | ORAL_CAPSULE | Freq: Two times a day (BID) | ORAL | 0 refills | Status: DC | PRN

## 2023-09-08 MED ORDER — AZITHROMYCIN 250 MG PO TABS: ORAL_TABLET | ORAL | 0 refills | Status: AC

## 2023-09-08 NOTE — Progress Notes (Signed)
BP 118/70 (BP Location: Left Arm, Patient Position: Sitting, Cuff Size: Large)   Pulse 95   Temp 98.5 F (36.9 C) (Oral)   Resp 18   Ht 5\' 6"  (1.676 m) Comment: per patient  Wt 255 lb 14.4 oz (116.1 kg)   SpO2 95%   BMI 41.30 kg/m    Subjective:    Patient ID: Alison [name], female    DOB: 07/20/1976, 47 y.o.   MRN: [id number]  HPI: Alison [name] is a 47 y.o. female  Chief Complaint  Patient presents with   Cough   Nasal Congestion    X1 1/2 weeks   Discussed the use of AI scribe software for clinical note transcription with the patient, who gave verbal consent to proceed.  History of Present Illness   The patient, presents with a week-long history of a persistent cough and phlegm production.  symptoms started almost 2 weeks ago. The symptoms started with a sore throat that lasted about twelve hours, followed by an overproduction of phlegm draining down the back of the throat and coughing fits. The patient describes the coughing fits as severe, leaving them breathless. They also report a sensation of a 'cheese grater' in their chest when they cough, indicating significant irritation in the bronchial tubes. The patient has also experienced a very low-grade fever and shortness of breath. They report facial tenderness and pressure pain in the ears. The patient has been self-medicating with mucinex and night and day quill, which they report has helped with the phlegm production. The patient has tested negative for COVID-19.   Relevant past medical, surgical, family and social history reviewed and updated as indicated. Interim medical history since our last visit reviewed. Allergies and medications reviewed and updated.  Review of Systems  Ten systems reviewed and is negative except as mentioned in HPI       Objective:    BP 118/70 (BP Location: Left Arm, Patient Position: Sitting, Cuff Size: Large)   Pulse 95   Temp 98.5 F (36.9 C) (Oral)   Resp 18   Ht 5\' 6"  (1.676  m) Comment: per patient  Wt 255 lb 14.4 oz (116.1 kg)   SpO2 95%   BMI 41.30 kg/m   Wt Readings from Last 3 Encounters:  09/08/23 255 lb 14.4 oz (116.1 kg)  06/04/23 252 lb 12.8 oz (114.7 kg)  06/03/23 250 lb 6.4 oz (113.6 kg)    Physical Exam  Constitutional: Patient appears well-developed and well-nourished. Obese  No distress.  HEENT: head atraumatic, normocephalic, pupils equal and reactive to light, ears TMs clear, neck supple, throat within normal limits, lymphadenopathy, facial tenderness Cardiovascular: Normal rate, regular rhythm and normal heart sounds.  No murmur heard. No BLE edema. Pulmonary/Chest: Effort normal and breath sounds rhonchi. No respiratory distress. Abdominal: Soft.  There is no tenderness. Psychiatric: Patient has a normal mood and affect. behavior is normal. Judgment and thought content normal.  Results for orders placed or performed in visit on 06/03/23  Lactate dehydrogenase  Result Value Ref Range   LDH 155 98 - 192 U/L  Comprehensive metabolic panel  Result Value Ref Range   Sodium 134 (L) 135 - 145 mmol/L   Potassium 4.7 3.5 - 5.1 mmol/L   Chloride 103 98 - 111 mmol/L   CO2 25 22 - 32 mmol/L   Glucose, Bld 231 (H) 70 - 99 mg/dL   BUN 20 6 - 20 mg/dL   Creatinine, Ser 8.41 0.44 - 1.00 mg/dL  Calcium 9.2 8.9 - 10.3 mg/dL   Total Protein 7.3 6.5 - 8.1 g/dL   Albumin 4.0 3.5 - 5.0 g/dL   AST 40 15 - 41 U/L   ALT 27 0 - 44 U/L   Alkaline Phosphatase 80 38 - 126 U/L   Total Bilirubin 0.7 0.3 - 1.2 mg/dL   GFR, Estimated >16 >10 mL/min   Anion gap 6 5 - 15  CBC with Differential/Platelet  Result Value Ref Range   WBC 7.0 4.0 - 10.5 K/uL   RBC 4.53 3.87 - 5.11 MIL/uL   Hemoglobin 12.9 12.0 - 15.0 g/dL   HCT 96.0 45.4 - 09.8 %   MCV 86.8 80.0 - 100.0 fL   MCH 28.5 26.0 - 34.0 pg   MCHC 32.8 30.0 - 36.0 g/dL   RDW 11.9 14.7 - 82.9 %   Platelets 147 (L) 150 - 400 K/uL   nRBC 0.0 0.0 - 0.2 %   Neutrophils Relative % 49 %   Neutro Abs 3.5 1.7  - 7.7 K/uL   Lymphocytes Relative 35 %   Lymphs Abs 2.4 0.7 - 4.0 K/uL   Monocytes Relative 8 %   Monocytes Absolute 0.5 0.1 - 1.0 K/uL   Eosinophils Relative 7 %   Eosinophils Absolute 0.5 0.0 - 0.5 K/uL   Basophils Relative 1 %   Basophils Absolute 0.1 0.0 - 0.1 K/uL   Immature Granulocytes 0 %   Abs Immature Granulocytes 0.03 0.00 - 0.07 K/uL      Assessment & Plan:   Problem List Items Addressed This Visit   None Visit Diagnoses     Viral upper respiratory tract infection    -  Primary   Relevant Medications   tacrolimus (PROGRAF) 1 mg/mL SUSP   azithromycin (ZITHROMAX) 250 MG tablet   benzonatate (TESSALON) 100 MG capsule   albuterol (VENTOLIN HFA) 108 (90 Base) MCG/ACT inhaler   Wheezing       Relevant Medications   albuterol (VENTOLIN HFA) 108 (90 Base) MCG/ACT inhaler   Acute non-recurrent pansinusitis       Relevant Medications   fluticasone (FLONASE) 50 MCG/ACT nasal spray   azithromycin (ZITHROMAX) 250 MG tablet   benzonatate (TESSALON) 100 MG capsule       Assessment and Plan    Upper Respiratory Infection/sinusitis  Symptoms of phlegm production, coughing fits, and bronchial irritation for one week. Low-grade fever and shortness of breath reported. Negative COVID-19 test. -Prescribe Azithromycin. -Prescribe cough medicine. -Prescribe Albuterol inhaler. -Advise to continue using Flonase and consider adding an allergy pill like Zyrtec or Claritin. -Advise to stay hydrated and rest.  Follow-up Patient to send a message in a few days to update on condition.        Follow up plan: Return if symptoms worsen or fail to improve.

## 2023-09-09 ENCOUNTER — Inpatient Hospital Stay: Payer: Medicaid Other

## 2023-09-09 ENCOUNTER — Other Ambulatory Visit: Payer: Self-pay | Admitting: Psychiatry

## 2023-09-09 ENCOUNTER — Telehealth: Payer: Self-pay | Admitting: Psychiatry

## 2023-09-09 DIAGNOSIS — F411 Generalized anxiety disorder: Secondary | ICD-10-CM

## 2023-09-09 DIAGNOSIS — F4310 Post-traumatic stress disorder, unspecified: Secondary | ICD-10-CM

## 2023-09-09 MED ORDER — VENLAFAXINE HCL ER 37.5 MG PO CP24: 37.5000 mg | ORAL_CAPSULE | Freq: Every day | ORAL | 10 refills | Status: DC

## 2023-09-09 MED ORDER — VENLAFAXINE HCL ER 75 MG PO CP24: 75.0000 mg | ORAL_CAPSULE | Freq: Every day | ORAL | 10 refills | Status: DC

## 2023-09-09 NOTE — Telephone Encounter (Signed)
I have sent venlafaxine extended release 37.5 mg and 75 mg to pharmacy.

## 2023-09-11 DIAGNOSIS — F4310 Post-traumatic stress disorder, unspecified: Secondary | ICD-10-CM | POA: Diagnosis not present

## 2023-09-11 DIAGNOSIS — F411 Generalized anxiety disorder: Secondary | ICD-10-CM | POA: Diagnosis not present

## 2023-09-12 ENCOUNTER — Ambulatory Visit
Admission: EM | Admit: 2023-09-12 | Discharge: 2023-09-12 | Disposition: A | Discharge status: Home / Self Care | Payer: Medicaid Other | Attending: Physician Assistant | Admitting: Physician Assistant

## 2023-09-12 ENCOUNTER — Ambulatory Visit: Payer: Medicaid Other

## 2023-09-12 DIAGNOSIS — R918 Other nonspecific abnormal finding of lung field: Secondary | ICD-10-CM | POA: Diagnosis not present

## 2023-09-12 DIAGNOSIS — J209 Acute bronchitis, unspecified: Secondary | ICD-10-CM

## 2023-09-12 DIAGNOSIS — R051 Acute cough: Secondary | ICD-10-CM | POA: Diagnosis not present

## 2023-09-12 DIAGNOSIS — R0602 Shortness of breath: Secondary | ICD-10-CM | POA: Diagnosis not present

## 2023-09-12 MED ORDER — DOXYCYCLINE HYCLATE 100 MG PO CAPS: 100.0000 mg | ORAL_CAPSULE | Freq: Two times a day (BID) | ORAL | 0 refills | Status: DC

## 2023-09-12 NOTE — ED Triage Notes (Signed)
Cough, SOB with exertion, congestion x 3 weeks. Pt went to PCP on Monday and is on z-pack last day is tomorrow, pt states she is not feeling better and still having cough and SOB and states it is a little worse now.   Right ear pain that started yesterday.

## 2023-09-12 NOTE — ED Provider Notes (Signed)
Renaldo Fiddler    CSN: [id number] Arrival date & time: 09/12/23  1409      History   Chief Complaint Chief Complaint  Patient presents with   Cough   Shortness of Breath    HPI Alison [name] is a 47 y.o. female.   Complains of a cough and congestion.  Patient reports that she was recently treated with Zithromax for a respiratory infection.  Patient reports she is now having pain in her ear and the cough and congestion is getting worse.  Reports she was prescribed Tessalon Perles and an albuterol inhaler which she is using.  Patient reports she has a past medical history of lymphoma.  Patient also has had a liver transplant.  Patient reports she is not having any abdominal pain.  The history is provided by the patient.  Cough Associated symptoms: shortness of breath   Shortness of Breath Associated symptoms: cough     Past Medical History:  Diagnosis Date   Abnormal uterine bleeding    Allergy    Anxiety    Arthritis    Bipolar disorder (manic depression) (HCC)    Chronic kidney failure    Chronic renal disease, stage III (HCC)    Diabetes mellitus without complication (HCC)    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HCC) 2015   Right axillary lymph node resected and chemo tx's.   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 02/2023 Echo: EF 55-60%, no rwma, nl RV size/fxn. Mildly dil LA.   Personality disorder (HCC)    Pineal gland cyst    Post herpetic neuralgia    PTSD (post-traumatic stress disorder)    Renal disorder    S/P liver transplant (HCC)  1993   Sleep apnea    has not recieved cpap yet-other one was recalled    Patient Active Problem List   Diagnosis Date Noted   Acute idiopathic pericarditis 03/09/2023   Acute pericarditis 03/08/2023   Sepsis (HCC) 03/06/2023   Chest pain 03/06/2023   CKD (chronic kidney disease), stage II 01/22/2023   Insulin dependent type 2 diabetes mellitus (HCC) 01/22/2023   Overactive bladder 01/22/2023   High risk medication use 12/12/2022   Sjogren's syndrome (HCC) 05/27/2022   Menorrhagia with irregular cycle    Akathisia 10/24/2021   At risk for prolonged QT interval syndrome 09/12/2021   Prolonged Q-T interval on ECG 08/29/2021   Insomnia 07/20/2021   Bipolar disorder, in full remission, most recent episode mixed (HCC) 07/20/2021   GAD (generalized anxiety disorder) 05/09/2021   Opioid dependence with opioid-induced disorder (HCC) 12/20/2020   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   Carpal tunnel syndrome, left 07/26/2019   Cubital tunnel syndrome on left 07/26/2019   Umbilical hernia without obstruction and without gangrene 06/17/2019   Lumbar spondylosis 06/02/2019   Obesity (BMI 35.0-39.9 without comorbidity) 06/02/2019   Goals of care, counseling/discussion 05/18/2019   Marginal zone B-cell  lymphoma (HCC) 05/18/2019   Fibromyalgia 03/23/2019   Systemic lupus erythematosus (SLE) in adult [facility]) 03/23/2019   Essential hypertension 03/23/2019   Mass of left kidney 03/23/2019   Nausea without vomiting 03/23/2019   Hyperlipidemia 03/23/2019   Osteoarthritis 03/23/2019   Trigeminal neuralgia 03/23/2019   Nephrolithiasis 03/23/2019   Migraine 03/23/2019   OSA on CPAP 03/23/2019   Fibrocystic breast 03/23/2019   Heart murmur 03/23/2019   Major depressive disorder, recurrent (HCC) 03/18/2017   Morbid obesity (HCC) 03/18/2017   Neuropathic pain 11/19/2016   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HCC) 01/13/2015   Lumbar disc disease with radiculopathy 07/26/2013   S/P  liver transplant (HCC) 07/26/2013   Facial nerve disorder 06/29/2012   Low back pain 12/26/2010    Past Surgical History:  Procedure Laterality Date   BONE MARROW BIOPSY  01/13/2015   BREAST BIOPSY  12/2014   BREAST BIOPSY  2011   BREAST SURGERY     CHOLECYSTECTOMY     COLONOSCOPY     COLONOSCOPY WITH PROPOFOL N/A 02/28/2022   Procedure: COLONOSCOPY WITH PROPOFOL;  Surgeon: Midge Minium, MD;  Location: [facility] ENDOSCOPY;  Service: Endoscopy;  Laterality: N/A;   ESOPHAGOGASTRODUODENOSCOPY     ESOPHAGOGASTRODUODENOSCOPY (EGD) WITH PROPOFOL N/A 01/09/2021   Procedure: ESOPHAGOGASTRODUODENOSCOPY (EGD) WITH PROPOFOL;  Surgeon: Midge Minium, MD;  Location: [facility] ENDOSCOPY;  Service: Endoscopy;  Laterality: N/A;   HERNIA REPAIR     x4-all from liver transplant   HYSTEROSCOPY WITH D & C N/A 11/13/2021   Procedure: DILATATION AND CURETTAGE /HYSTEROSCOPY;  Surgeon: Natale Milch, MD;  Location: [facility] ORS;  Service: Gynecology;  Laterality: N/A;   infusaport     LIVER TRANSPLANT  12/17/1991   LUMBAR PUNCTURE     PORT A CATH INJECTION ([facility] HX)     RENAL BIOPSY     tumor removal  2015   cancer right side    OB History     Gravida  0   Para  0   Term  0   Preterm  0   AB  0   Living  0      SAB  0   IAB  0   Ectopic  0   Multiple  0   Live Births  0            Home Medications    Prior to Admission medications   Medication Sig Start Date End Date Taking? Authorizing Provider  Accu-Chek Softclix Lancets lancets 2 (two) times daily. 06/01/23  Yes [provider]  albuterol (VENTOLIN HFA) 108 (90 Base) MCG/ACT inhaler Inhale 2 puffs into the lungs every 6 (six) hours as needed for wheezing or shortness of breath. 09/08/23  Yes [name], FNP  azithromycin (ZITHROMAX) 250 MG tablet Take 2 tablets on day 1, then 1 tablet daily on days 2 through 5 09/08/23 09/13/23 Yes Pender, Rudolpho Sevin, FNP  baclofen (LIORESAL) 10 MG tablet Take 1 tablet by mouth 2 (two)  times daily. 08/15/21  Yes [provider]  benzonatate (TESSALON) 100 MG capsule Take 2 capsules (200 mg total) by mouth 2 (two) times daily as needed for cough. 09/08/23  Yes [name], FNP  Cholecalciferol (VITAMIN D) 125 MCG (5000 UT) CAPS Take 5,000 Units by mouth daily.   Yes [provider]  colchicine 0.6 MG tablet Take by mouth. 07/02/23  Yes [provider]  doxycycline (VIBRAMYCIN) 100 MG capsule Take 1 capsule (100 mg total) by mouth 2 (  two) times daily. 09/12/23  Yes Cheron Schaumann K, PA-C  Dulaglutide 4.5 MG/0.5ML SOPN Inject 4.5 mg into the skin once a week. 11/28/21  Yes [provider]  ezetimibe (ZETIA) 10 MG tablet Take 10 mg by mouth every morning. 04/09/21  Yes [provider]  fluticasone (FLONASE) 50 MCG/ACT nasal spray Place into the nose. 10/30/21  Yes [provider]  ibuprofen (ADVIL) 400 MG tablet Take 1 tablet (400 mg total) by mouth every 6 (six) hours as needed. 03/11/23  Yes Marcelino Duster, MD  Insulin Degludec (TRESIBA) 100 UNIT/ML SOLN Inject 30 Units into the skin.   Yes [provider]  Insulin Glargine (BASAGLAR KWIKPEN) 100 UNIT/ML Inject 40 Units into the skin at bedtime. 03/03/23  Yes [provider]  Insulin Pen Needle 32G X 6 MM MISC 1 each by Does not apply route as needed. 05/22/23  Yes [name], FNP  lisinopril (ZESTRIL) 20 MG tablet TAKE 1 TABLET BY MOUTH ONCE DAILY 08/01/23  Yes [name], FNP  lurasidone (LATUDA) 20 MG TABS tablet TAKE 1 TABLET BY MOUTH ONCE DAILY WITH SUPPER *REFILL REQUEST* 06/04/23  Yes [name], Levin Bacon, MD  magnesium oxide (MAG-OX) 400 MG tablet Take by mouth.   Yes [provider]  Melatonin 10 MG TABS Take 10 mg by mouth at bedtime.   Yes [provider]  metFORMIN (GLUCOPHAGE) 1000 MG tablet Take 1 tablet (1,000 mg total) by mouth 2 (two) times daily with a meal. 01/22/23  Yes [name], FNP  mirabegron ER (MYRBETRIQ) 50 MG  TB24 tablet Take 1 tablet (50 mg total) by mouth daily. 01/22/23  Yes [name], FNP  mirtazapine (REMERON) 15 MG tablet TAKE 1 TABLET BY MOUTH EVERY DAY AT BEDTIME *REFILL REQUEST* 06/04/23  Yes [name], [name], MD  NOVOLOG FLEXPEN 100 UNIT/ML FlexPen Inject 0-30 Units into the skin 3 (three) times daily with meals. Sliding scale 01/21/23  Yes [provider]  oxyCODONE (OXY IR/ROXICODONE) 5 MG immediate release tablet Take 5 mg by mouth 3 (three) times daily as needed.   Yes [provider]  pregabalin (LYRICA) 200 MG capsule Take 1 capsule (200 mg total) by mouth 2 (two) times daily. 12/03/21  Yes Phineas Semen, MD  rosuvastatin (CRESTOR) 10 MG tablet Take 1 tablet (10 mg total) by mouth daily. 08/21/23  Yes Agbor-Etang, Arlys John, MD  tacrolimus (PROGRAF) 1 MG capsule Take 1 capsule by mouth every morning. 02/20/23  Yes [provider]  tacrolimus (PROGRAF) 1 mg/mL SUSP 1 Milligram(s) Oral every 24 hours. 08/16/13  Yes [provider]  TOUJEO SOLOSTAR 300 UNIT/ML Solostar Pen Inject into the skin. 07/05/23  Yes [provider]  UBRELVY 50 MG TABS Take 1 tablet by mouth daily as needed. 01/19/22  Yes [provider]  venlafaxine XR (EFFEXOR-XR) 37.5 MG 24 hr capsule Take 1 capsule (37.5 mg total) by mouth daily with breakfast. Please take along with 75 mg p.o. daily. 09/09/23  Yes [name], MD  venlafaxine XR (EFFEXOR-XR) 75 MG 24 hr capsule Take 1 capsule (75 mg total) by mouth daily with breakfast. Take along with 37.5 mg daily 09/09/23  Yes [name], Levin Bacon, MD  XTAMPZA ER 9 MG C12A Take 1 capsule by mouth every 12 (twelve) hours. 08/07/22  Yes [provider]  Xylitol (XYLIMELTS) 550 MG DISK Take by mouth. 07/20/21  Yes [provider]  benztropine (COGENTIN) 1 MG tablet TAKE 1 TABLET BY MOUTH ONCE DAILY AS NEEDED FOR TREMORS  08/01/23   [name], MD  Cranberry-Vitamin C-Probiotic (AZO CRANBERRY PO) Take by mouth.     [provider]  dronabinol (MARINOL) 2.5 MG capsule Take 2.5 mg by mouth See admin instructions. Takes every other day Patient not taking: Reported on 09/08/2023    [provider]  icosapent Ethyl (VASCEPA) 1 g capsule Take 2 capsules (2 g total) by mouth 2 (two) times daily. Patient not taking: Reported on 09/12/2023 09/07/21   Debbe Odea, MD  meloxicam (MOBIC) 15 MG tablet Take 1 tablet (15 mg total) by mouth daily. 10/21/22 10/21/23  Cuthriell, [name], PA-C  methocarbamol (ROBAXIN) 500 MG tablet Take 1 tablet (500 mg total) by mouth 4 (four) times daily. 10/21/22   Cuthriell, [name], PA-C  naloxone [facility]) nasal spray 4 mg/0.1 mL Place 0.4 mg into the nose once. 09/05/21   [provider]  PRECISION QID TEST test strip  08/06/19   [provider]    Family History Family History  Problem Relation Age of Onset   Heart disease Mother    Hypertension Mother    Cancer - Other Mother    Bipolar disorder Mother    Heart disease Father    Hypertension Father    Diabetes Father    Parkinson's disease Maternal Grandmother    Cancer Maternal Aunt    Cancer Maternal Uncle    Cancer Maternal Grandfather    Lupus Paternal Grandmother    Hypertension Brother     Social History Social History   Tobacco Use   Smoking status: Never   Smokeless tobacco: Never  Vaping Use   Vaping status: Never Used  Substance Use Topics   Alcohol use: Not Currently   Drug use: Not Currently    Comment: pain managment last used in early april     Allergies   Penicillin g, Lamictal [lamotrigine], Tape, Carbamazepine, Hydrocodone-acetaminophen, Naproxen, Penicillins, and Vancomycin   Review of Systems Review of Systems  Respiratory:  Positive for cough and shortness of breath.   All other systems reviewed and are negative.    Physical Exam Triage Vital Signs ED Triage Vitals  Encounter Vitals Group     BP 09/12/23 1437 109/71     Systolic BP  Percentile --      Diastolic BP Percentile --      Pulse Rate 09/12/23 1437 (!) 101     Resp 09/12/23 1437 18     Temp 09/12/23 1437 100.2 F (37.9 C)     Temp Source 09/12/23 1437 Oral     SpO2 09/12/23 1437 94 %     Weight --      Height --      Head Circumference --      Peak Flow --      Pain Score 09/12/23 1439 6     Pain Loc --      Pain Education --      Exclude from Growth Chart --    No data found.  Updated Vital Signs BP 109/71 (BP Location: Left Arm)   Pulse (!) 101   Temp 100.2 F (37.9 C) (Oral)   Resp 18   LMP 10/16/2021 (Exact Date) Comment: Seen by OB GYN on 10/16/21 for 6 days of excessive bleeding as a post menapause  SpO2 94%   Visual Acuity Right Eye Distance:   Left Eye Distance:   Bilateral Distance:    Right Eye Near:   Left Eye Near:    Bilateral Near:  Physical Exam Vitals and nursing note reviewed.  Constitutional:      Appearance: She is well-developed.  HENT:     Head: Normocephalic.  Cardiovascular:     Rate and Rhythm: Normal rate and regular rhythm.  Pulmonary:     Effort: Pulmonary effort is normal.     Breath sounds: No decreased breath sounds.  Abdominal:     General: There is no distension.  Musculoskeletal:        General: Normal range of motion.     Cervical back: Normal range of motion.  Skin:    General: Skin is warm.  Neurological:     General: No focal deficit present.     Mental Status: She is alert and oriented to person, place, and time.  Psychiatric:        Mood and Affect: Mood normal.      UC Treatments / Results  Labs (all labs ordered are listed, but only abnormal results are displayed) Labs Reviewed - No data to display  EKG   Radiology DG Chest 2 View  Result Date: 09/12/2023 CLINICAL DATA:  Cough short of breath EXAM: CHEST - 2 VIEW COMPARISON:  03/06/2023 FINDINGS: Left-sided central venous port tip over the SVC. No acute airspace disease or effusion. Normal cardiac size. No pneumothorax.  Central airways thickening. IMPRESSION: Central airways thickening suggesting bronchitis or reactive airways. No focal pneumonia. Electronically Signed   By: Jasmine Pang M.D.   On: 09/12/2023 16:35    Procedures Procedures (including critical care time)  Medications Ordered in UC Medications - No data to display  Initial Impression / Assessment and Plan / UC Course  I have reviewed the triage vital signs and the nursing notes.  Pertinent labs & imaging results that were available during my care of the patient were reviewed by me and considered in my medical decision making (see chart for details).      Final Clinical Impressions(s) / UC Diagnoses   Final diagnoses:  Acute bronchitis, unspecified organism     Discharge Instructions      Return if any problems.  See your Physician for recheck.     ED Prescriptions     Medication Sig Dispense Auth. Provider   doxycycline (VIBRAMYCIN) 100 MG capsule Take 1 capsule (100 mg total) by mouth 2 (two) times daily. 20 capsule Elson Areas, [place]      PDMP not reviewed this encounter. An After Visit Summary was printed and given to the patient.       Elson Areas, [place] 09/12/23 [id number]

## 2023-09-12 NOTE — Discharge Instructions (Addendum)
Return if any problems.  See your Physician for recheck  °

## 2023-09-16 ENCOUNTER — Inpatient Hospital Stay: Payer: Medicaid Other

## 2023-09-18 ENCOUNTER — Other Ambulatory Visit: Payer: Self-pay | Admitting: Nurse Practitioner

## 2023-09-18 ENCOUNTER — Encounter: Payer: Self-pay | Admitting: Nurse Practitioner

## 2023-09-18 NOTE — Telephone Encounter (Signed)
Historical provider.  

## 2023-09-23 ENCOUNTER — Inpatient Hospital Stay: Payer: Medicaid Other | Attending: Oncology

## 2023-09-23 DIAGNOSIS — Z95828 Presence of other vascular implants and grafts: Secondary | ICD-10-CM

## 2023-09-23 DIAGNOSIS — Z8572 Personal history of non-Hodgkin lymphomas: Secondary | ICD-10-CM | POA: Insufficient documentation

## 2023-09-23 MED ORDER — HEPARIN SOD (PORK) LOCK FLUSH 100 UNIT/ML IV SOLN
500.0000 [IU] | Freq: Once | INTRAVENOUS | Status: AC
Start: 2023-09-23 — End: 2023-09-23
  Administered 2023-09-23: 500 [IU] via INTRAVENOUS
  Filled 2023-09-23: qty 5

## 2023-09-23 MED ORDER — SODIUM CHLORIDE 0.9% FLUSH
10.0000 mL | Freq: Once | INTRAVENOUS | Status: AC
Start: 2023-09-23 — End: 2023-09-23
  Administered 2023-09-23: 10 mL via INTRAVENOUS
  Filled 2023-09-23: qty 10

## 2023-09-24 DIAGNOSIS — M5442 Lumbago with sciatica, left side: Secondary | ICD-10-CM | POA: Diagnosis not present

## 2023-09-24 DIAGNOSIS — I10 Essential (primary) hypertension: Secondary | ICD-10-CM | POA: Diagnosis not present

## 2023-09-24 DIAGNOSIS — G893 Neoplasm related pain (acute) (chronic): Secondary | ICD-10-CM | POA: Diagnosis not present

## 2023-09-24 DIAGNOSIS — F3189 Other bipolar disorder: Secondary | ICD-10-CM | POA: Diagnosis not present

## 2023-09-24 DIAGNOSIS — G894 Chronic pain syndrome: Secondary | ICD-10-CM | POA: Diagnosis not present

## 2023-09-24 DIAGNOSIS — M79603 Pain in arm, unspecified: Secondary | ICD-10-CM | POA: Diagnosis not present

## 2023-09-24 DIAGNOSIS — M25519 Pain in unspecified shoulder: Secondary | ICD-10-CM | POA: Diagnosis not present

## 2023-09-24 DIAGNOSIS — Z79891 Long term (current) use of opiate analgesic: Secondary | ICD-10-CM | POA: Diagnosis not present

## 2023-09-24 DIAGNOSIS — M5414 Radiculopathy, thoracic region: Secondary | ICD-10-CM | POA: Diagnosis not present

## 2023-09-24 DIAGNOSIS — M79606 Pain in leg, unspecified: Secondary | ICD-10-CM | POA: Diagnosis not present

## 2023-09-24 DIAGNOSIS — M79669 Pain in unspecified lower leg: Secondary | ICD-10-CM | POA: Diagnosis not present

## 2023-09-26 DIAGNOSIS — K7460 Unspecified cirrhosis of liver: Secondary | ICD-10-CM | POA: Diagnosis not present

## 2023-09-26 DIAGNOSIS — R932 Abnormal findings on diagnostic imaging of liver and biliary tract: Secondary | ICD-10-CM | POA: Diagnosis not present

## 2023-09-26 DIAGNOSIS — Z5181 Encounter for therapeutic drug level monitoring: Secondary | ICD-10-CM | POA: Diagnosis not present

## 2023-09-26 DIAGNOSIS — Z944 Liver transplant status: Secondary | ICD-10-CM | POA: Diagnosis not present

## 2023-09-26 DIAGNOSIS — C229 Malignant neoplasm of liver, not specified as primary or secondary: Secondary | ICD-10-CM | POA: Diagnosis not present

## 2023-09-26 DIAGNOSIS — K769 Liver disease, unspecified: Secondary | ICD-10-CM | POA: Diagnosis not present

## 2023-10-03 DIAGNOSIS — G4733 Obstructive sleep apnea (adult) (pediatric): Secondary | ICD-10-CM | POA: Diagnosis not present

## 2023-10-07 ENCOUNTER — Other Ambulatory Visit: Payer: Self-pay | Admitting: Obstetrics and Gynecology

## 2023-10-07 NOTE — Patient Outreach (Signed)
 Medicaid Managed Care   Nurse Care Manager Note  10/07/2023 Name:  Alison [name] MRN:  [id number] DOB:  [birth year]  Alison [name] is an 47 y.o. year old female who is a primary patient of Alison Mliss FALCON, FNP.  The [facility] Managed Care Coordination team was consulted for assistance with:    Chronic healthcare management needs, HTN, DM, anxiety/depression/BP/PTSD/OSD, chronic pain, migraines, DLBCL, HLD, fibromyalgia, osteoarthritis, liver transplant  Alison [name] was given information about Medicaid Managed Care Coordination team services today. [name] Patient agreed to services and verbal consent obtained.  Engaged with patient by telephone for follow up visit in response to provider referral for case management and/or care coordination services.   Patient is participating in a Managed Medicaid Plan:  Yes  Assessments/Interventions:  Review of past medical history, allergies, medications, health status, including review of consultants reports, laboratory and other test data, was performed as part of comprehensive evaluation and provision of chronic care management services.  SDOH (Social Drivers of Health) assessments and interventions performed: SDOH Interventions    Flowsheet Row Patient Outreach Telephone from 10/07/2023 in [place] POPULATION HEALTH DEPARTMENT Patient Outreach Telephone from 09/05/2023 in [place] POPULATION HEALTH DEPARTMENT Patient Outreach Telephone from 08/05/2023 in [place] POPULATION HEALTH DEPARTMENT Patient Outreach Telephone from 06/05/2023 in [place] POPULATION HEALTH DEPARTMENT Patient Outreach Telephone from 04/29/2023 in [place] POPULATION HEALTH DEPARTMENT Patient Outreach Telephone from 03/25/2023 in [place] POPULATION HEALTH DEPARTMENT  SDOH Interventions        Food Insecurity Interventions -- -- Intervention Not Indicated -- -- --  Housing Interventions -- -- Intervention Not Indicated -- -- --  Transportation  Interventions -- Intervention Not Indicated -- -- -- --  Utilities Interventions -- -- -- Intervention Not Indicated -- --  Alcohol Usage Interventions Intervention Not Indicated (Score <7) -- -- -- -- --  Financial Strain Interventions Intervention Not Indicated -- -- -- -- Intervention Not Indicated  Physical Activity Interventions -- -- -- -- -- Other (Comments)  [not able to enage in moderate to strenuous exercise at this level]  Stress Interventions -- -- -- -- Provide Counseling  [followed by Psychiatry] --  Social Connections Interventions -- -- -- Intervention Not Indicated -- --  Health Literacy Interventions -- -- -- -- Intervention Not Indicated --     Care Plan Allergies  Allergen Reactions   Penicillin G Itching, Rash, Anaphylaxis and Palpitations   Lamictal  [Lamotrigine ] Rash   Tape    Carbamazepine Other (See Comments)    Medication interaction-prograf     Hydrocodone-Acetaminophen  Itching   Naproxen Itching   Penicillins    Vancomycin  Rash    Macular/blotchy rash at infusion site   Medications Reviewed Today     Reviewed by [name], RN (Registered Nurse) on 10/07/23 at 1240  Med List Status: <None>   Medication Order Taking? Sig Documenting Provider Last Dose Status Informant  Accu-Chek Softclix Lancets lancets [id number] No 2 (two) times daily. [provider] Past Week Active   albuterol  (VENTOLIN  HFA) 108 (90 Base) MCG/ACT inhaler [id number] No Inhale 2 puffs into the lungs every 6 (six) hours as needed for wheezing or shortness of breath. Alison Mliss FALCON, FNP 09/12/2023 Active   baclofen  (LIORESAL ) 10 MG tablet [id number] No Take 1 tablet by mouth 2 (two) times daily. [provider] 09/12/2023 Active Self, Pharmacy Records  benzonatate  (TESSALON ) 100 MG capsule [id number] No Take 2 capsules (200 mg total) by mouth 2 (two) times daily as needed  for cough. Alison Mliss FALCON, FNP 09/11/2023 Active   benztropine  (COGENTIN ) 1 MG tablet [id number] No  TAKE 1 TABLET BY MOUTH ONCE DAILY AS NEEDED FOR TREMORS [name], [name], MD Taking Active   Cholecalciferol  (VITAMIN D ) 125 MCG (5000 UT) CAPS [id number] No Take 5,000 Units by mouth daily. [provider] 09/12/2023 Active Self, Pharmacy Records  colchicine  0.6 MG tablet [id number] No Take by mouth. [provider] 09/12/2023 Active   Cranberry-Vitamin C-Probiotic (AZO CRANBERRY PO) [id number] No Take by mouth. [provider] Taking Active   doxycycline  (VIBRAMYCIN ) 100 MG capsule [id number]  Take 1 capsule (100 mg total) by mouth 2 (two) times daily. Sofia, Leslie K, PA-C  Active   dronabinol (MARINOL) 2.5 MG capsule [id number] No Take 2.5 mg by mouth See admin instructions. Takes every other day  Patient not taking: Reported on 09/08/2023   [provider] Not Taking Active Self, Pharmacy Records  Dulaglutide  4.5 MG/0.5ML SOPN [id number] No Inject 4.5 mg into the skin once a week. [provider] Past Week Active Self, Pharmacy Records  ezetimibe  (ZETIA ) 10 MG tablet [id number] No Take 10 mg by mouth every morning. [provider] 09/12/2023 Active Self, Pharmacy Records  fluticasone  (FLONASE ) 50 MCG/ACT nasal spray [id number] No Place into the nose. [provider] 09/12/2023 Active   ibuprofen  (ADVIL ) 400 MG tablet [id number] No Take 1 tablet (400 mg total) by mouth every 6 (six) hours as needed. Darci Pore, MD 09/12/2023 Active   icosapent  Ethyl (VASCEPA ) 1 g capsule [id number] No Take 2 capsules (2 g total) by mouth 2 (two) times daily.  Patient not taking: Reported on 09/12/2023   Darliss Rogue, MD Not Taking Active Self, Pharmacy Records           Med Note [place], DOROTHYANN DASEN   Tue Mar 19, 2022 10:24 AM) Taking 1 capsule twice daily per Dr. Morayati  Insulin  Degludec (TRESIBA) 100 UNIT/ML SOLN [id number] No Inject 30 Units into the skin. [provider] 09/12/2023 Active Self, Pharmacy Records  Insulin  Glargine  (BASAGLAR  KWIKPEN) 100 UNIT/ML [id number] No Inject 40 Units into the skin at bedtime. [provider] 09/12/2023 Active Self, Pharmacy Records  Insulin  Pen Needle 32G X 6 MM MISC [id number] No 1 each by Does not apply route as needed. Alison Mliss FALCON, FNP 09/12/2023 Active   lisinopril  (ZESTRIL ) 20 MG tablet [id number] No TAKE 1 TABLET BY MOUTH ONCE DAILY Pender, Julie F, FNP 09/12/2023 Active   lurasidone  (LATUDA ) 20 MG TABS tablet [id number] No TAKE 1 TABLET BY MOUTH ONCE DAILY WITH SUPPER *REFILL REQUEST* [name], [name], MD 09/12/2023 Active   magnesium  oxide (MAG-OX) 400 MG tablet [id number] No Take by mouth. [provider] 09/12/2023 Active Self, Pharmacy Records  Melatonin 10 MG TABS [id number] No Take 10 mg by mouth at bedtime. [provider] Past Week Active Self, Pharmacy Records  meloxicam  (MOBIC ) 15 MG tablet [id number] No Take 1 tablet (15 mg total) by mouth daily. Cuthriell, Dorn BIRCH, PA-C Taking Active Self, Pharmacy Records  metFORMIN  (GLUCOPHAGE ) 1000 MG tablet [id number] No Take 1 tablet (1,000 mg total) by mouth 2 (two) times daily with a meal. Alison Mliss FALCON, FNP 09/12/2023 Active Self, Pharmacy Records  methocarbamol  (ROBAXIN ) 500 MG tablet [id number] No Take 1 tablet (500 mg total) by mouth 4 (four) times daily. Cuthriell, Dorn BIRCH, PA-C Taking Active Self, Pharmacy Records  mirabegron  ER (MYRBETRIQ ) 50 MG TB24 tablet [id number] No Take 1 tablet (50 mg total) by mouth daily. Alison Mliss FALCON, FNP  09/12/2023 Active Self, Pharmacy Records  mirtazapine  (REMERON ) 15 MG tablet [id number] No TAKE 1 TABLET BY MOUTH EVERY DAY AT BEDTIME *REFILL REQUEST* [name], [name], MD 09/12/2023 Active   naloxone  (NARCAN ) nasal spray 4 mg/0.1 mL [id number] No Place 0.4 mg into the nose once. [provider] Taking Active Self, Pharmacy Records  NOVOLOG  FLEXPEN 100 UNIT/ML FlexPen [id number] No Inject 0-30 Units into the skin 3 (three) times daily with meals. Sliding scale  [provider] 09/12/2023 Active Self, Pharmacy Records  oxyCODONE  (OXY IR/ROXICODONE ) 5 MG immediate release tablet [id number] No Take 5 mg by mouth 3 (three) times daily as needed. [provider] 09/12/2023 Active   PRECISION QID TEST test strip [id number] No  [provider] Taking Active Self, Pharmacy Records  pregabalin  (LYRICA ) 200 MG capsule [id number] No Take 1 capsule (200 mg total) by mouth 2 (two) times daily. Floy Roberts, MD 09/12/2023 Active Self, Pharmacy Records  rosuvastatin  (CRESTOR ) 10 MG tablet [id number] No Take 1 tablet (10 mg total) by mouth daily. Darliss Rogue, MD 09/12/2023 Active   tacrolimus  (PROGRAF ) 1 MG capsule [id number] No Take 1 capsule by mouth every morning. [provider] 09/12/2023 Active Self, Pharmacy Records  tacrolimus  (PROGRAF ) 1 mg/mL SUSP [id number] No 1 Milligram(s) Oral every 24 hours. [provider] 09/12/2023 Active   TOUJEO  SOLOSTAR 300 UNIT/ML Solostar Pen [id number] No Inject into the skin. [provider] 09/12/2023 Active   UBRELVY  50 MG TABS [id number] No Take 1 tablet by mouth daily as needed. [provider] 09/12/2023 Active Self, Pharmacy Records  venlafaxine  XR (EFFEXOR -XR) 37.5 MG 24 hr capsule [id number] No Take 1 capsule (37.5 mg total) by mouth daily with breakfast. Please take along with 75 mg p.o. daily. [name], [name], MD 09/12/2023 Active   venlafaxine  XR (EFFEXOR -XR) 75 MG 24 hr capsule [id number] No Take 1 capsule (75 mg total) by mouth daily with breakfast. Take along with 37.5 mg daily [name], [name], MD 09/12/2023 Active   XTAMPZA  ER 9 MG C12A [id number] No Take 1 capsule by mouth every 12 (twelve) hours. [provider] 09/12/2023 Active Self, Pharmacy Records  Xylitol (XYLIMELTS) 550 MG DISK [id number] No Take by mouth. [provider] 09/12/2023 Active            Patient Active Problem List   Diagnosis Date Noted   Acute idiopathic pericarditis  03/09/2023   Acute pericarditis 03/08/2023   Sepsis (HCC) 03/06/2023   Chest pain 03/06/2023   CKD (chronic kidney disease), stage II 01/22/2023   Insulin  dependent type 2 diabetes mellitus (HCC) 01/22/2023   Overactive bladder 01/22/2023   High risk medication use 12/12/2022   Sjogren's syndrome (HCC) 05/27/2022   Menorrhagia with irregular cycle    Akathisia 10/24/2021   At risk for prolonged QT interval syndrome 09/12/2021   Prolonged Q-T interval on ECG 08/29/2021   Insomnia 07/20/2021   Bipolar disorder, in full remission, most recent episode mixed (HCC) 07/20/2021   GAD (generalized anxiety disorder) 05/09/2021   Opioid dependence with opioid-induced disorder (HCC) 12/20/2020   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   Carpal tunnel syndrome, left 07/26/2019   Cubital tunnel syndrome on left 07/26/2019   Umbilical hernia without obstruction and without gangrene 06/17/2019   Lumbar spondylosis 06/02/2019   Obesity (BMI 35.0-39.9 without comorbidity) 06/02/2019   Goals of care, counseling/discussion 05/18/2019   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   Fibromyalgia 03/23/2019   Systemic lupus erythematosus (SLE) in adult [facility]) 03/23/2019   Essential hypertension 03/23/2019  Mass of left kidney 03/23/2019   Nausea without vomiting 03/23/2019   Hyperlipidemia 03/23/2019   Osteoarthritis 03/23/2019   Trigeminal neuralgia 03/23/2019   Nephrolithiasis 03/23/2019   Migraine 03/23/2019   OSA on CPAP 03/23/2019   Fibrocystic breast 03/23/2019   Heart murmur 03/23/2019   Major depressive disorder, recurrent (HCC) 03/18/2017   Morbid obesity (HCC) 03/18/2017   Neuropathic pain 11/19/2016   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HCC) 01/13/2015   Lumbar disc disease with radiculopathy 07/26/2013   S/P liver transplant (HCC) 07/26/2013   Facial nerve disorder 06/29/2012   Low back pain 12/26/2010   Conditions to be addressed/monitored per PCP order:  Chronic  healthcare management needs, HTN, DM, anxiety/depression/BP/PTSD/OSD, chronic pain, migraines, DLBCL, HLD, fibromyalgia, osteoarthritis, liver transplant  Care Plan : Chronic Pain (Adult)  Updates made by [name], RN since 10/07/2023 12:00 AM     Problem: Chronic Pain Management-fibromyalgia   Priority: High  Onset Date: 01/22/2021     Long-Range Goal: Fibromyalgia pain managed-new pain management provider   Start Date: 08/14/2020  Expected End Date: 12/05/2023  Recent Progress: On track  Priority: High  Note:   Current Barriers:  Knowledge deficits related to pain management  10/07/23:  recovering from URI/bronchitis, seen at [facility] 12/6.  Left hip pain-taking Oxycodone  and will make ORTHO appt.  To also schedule DERM appt.  Turning 48 in January and wants to go eat at favorite Thai restaurant in [place].  Nurse Case Manager Clinical Goal(s):  Over the next 45 days, patient will work with [facility] to address needs related to referral for pain management provider and associated care coordination needs. 05/24/22:  patient is currently seeing Dr. Fernand at [facility].  Interventions:  Inter-disciplinary care team collaboration (see longitudinal plan of care) Evaluation of current treatment plan related to fibromyalgia  and patient's adherence to plan as established by provider. Update 06/15/21:  Patient taking Baclofen  again, Effexor  added. Collaborated with primary care provider regarding recommendations and referral to pain management provider. Discussed plans with patient for ongoing care management follow up and provided patient with direct contact information for care management team Anticipate pain education program, pain management support as part of pain management referral.       Patient Goals/Self-Care Activities Over the next 45 days, patient will:  -Attends all scheduled provider appointments  develop a personal pain management plan with your pain management  provider. Patient will f/u with Dr. Fernand and insurance regarding auth. 10/07/23:  To schedule ORTHO and DERM appt.  Follow Up Plan:  The Managed Medicaid care management team will reach out to the patient again over the next 30 business days.    Follow Up:  Patient agrees to Care Plan and Follow-up.  Plan: The Managed Medicaid care management team will reach out to the patient again over the next 30 business  days. and The  Patient has been provided with contact information for the Managed Medicaid care management team and has been advised to call with any health related questions or concerns.  Date/time of next scheduled RN care management/care coordination outreach: 11/07/23 at 230

## 2023-10-07 NOTE — Patient Instructions (Signed)
 Hi Alison [name], I am glad you are getting better-have a nice afternoon and Happy New Year!  Ms. Szymanowski was given information about Medicaid Managed Care team care coordination services as a part of their Healthy [facility] Medicaid benefit. Bari LITTIE Verdon verbally consented to engagement with the [facility] Managed Care team.   If you are experiencing a medical emergency, please call 911 or report to your local emergency department or urgent care.   If you have a non-emergency medical problem during routine business hours, please contact your provider's office and ask to speak with a nurse.   For questions related to your Healthy [facility] health plan, please call: [phone] or visit the homepage here: [email or web address]  If you would like to schedule transportation through your Healthy [facility] plan, please call the following number at least 2 days in advance of your appointment: [phone]  For information about your ride after you set it up, call Ride Assist at [phone]. Use this number to activate a Will Call pickup, or if your transportation is late for a scheduled pickup. Use this number, too, if you need to make a change or cancel a previously scheduled reservation.  If you need transportation services right away, call [phone]. The after-hours call center is staffed 24 hours to handle ride assistance and urgent reservation requests (including discharges) 365 days a year. Urgent trips include sick visits, hospital discharge requests and life-sustaining treatment.  Call the [facility] Line at [phone], at any time, 24 hours a day, 7 days a week. If you are in danger or need immediate medical attention call 911.  If you would like help to quit smoking, call 1-800-QUIT-NOW (365-266-6179) OR Espaol: 1-855-Djelo-Ya (8-144-664-6430) o para ms informacin haga clic aqu or Text READY to 799-599 to register via text  Ms.  Nazir - following are the goals we discussed in your visit today:   Goals Addressed             This Visit's Progress    Chronic Pain Managed       10/07/23: left hip pain, to make ORTHO appt       Get sugars at goal       Timeframe:  Short-Term Goal Priority:  High Start Date:                             Expected End Date:      ongoing                 Follow Up Date: 11/07/23   - call for medicine refill 2 or 3 days before it runs out - call if I am sick and can't take my medicine - keep a list of all the medicines I take; vitamins and herbals too    Why is this important?   These steps will help you keep on track with your medicines. 10/07/23:  Blood sugars 200s per patient-has ENDO appt 2/10.   Patient verbalizes understanding of instructions and care plan provided today and agrees to view in MyChart. Active MyChart status and patient understanding of how to access instructions and care plan via MyChart confirmed with patient.     The Managed Medicaid care management team will reach out to the patient again over the next 30 business  days.  The  Patient  has been provided with contact information for the Managed Medicaid care management team and has been advised to call  with any health related questions or concerns.   Veva Angles RN, [name] [place]  [facility] Care Management Coordinator - Managed Medicaid High Risk [phone]   Following is a copy of your plan of care:  Care Plan : Chronic Pain (Adult)  Updates made by Angles Veva MATSU, RN since 10/07/2023 12:00 AM     Problem: Chronic Pain Management-fibromyalgia   Priority: High  Onset Date: 01/22/2021     Long-Range Goal: Fibromyalgia pain managed-new pain management provider   Start Date: 08/14/2020  Expected End Date: 12/05/2023  Recent Progress: On track  Priority: High  Note:   Current Barriers:  Knowledge deficits related to pain management  10/07/23:  recovering from URI/bronchitis,  seen at [facility] 12/6.  Left hip pain-taking Oxycodone  and will make ORTHO appt.  To also schedule DERM appt.  Turning 48 in January and wants to go eat at favorite Thai restaurant in [place].  Nurse Case Manager Clinical Goal(s):  Over the next 45 days, patient will work with [facility] to address needs related to referral for pain management provider and associated care coordination needs. 05/24/22:  patient is currently seeing Dr. Fernand at [facility].  Interventions:  Inter-disciplinary care team collaboration (see longitudinal plan of care) Evaluation of current treatment plan related to fibromyalgia  and patient's adherence to plan as established by provider. Update 06/15/21:  Patient taking Baclofen  again, Effexor  added. Collaborated with primary care provider regarding recommendations and referral to pain management provider. Discussed plans with patient for ongoing care management follow up and provided patient with direct contact information for care management team Anticipate pain education program, pain management support as part of pain management referral.       Patient Goals/Self-Care Activities Over the next 45 days, patient will:  -Attends all scheduled provider appointments  develop a personal pain management plan with your pain management provider. Patient will f/u with Dr. Fernand and insurance regarding auth. 10/07/23:  To schedule ORTHO and DERM appt.  Follow Up Plan:  The Managed Medicaid care management team will reach out to the patient again over the next 30 business days.

## 2023-10-09 NOTE — Telephone Encounter (Signed)
 Created in error

## 2023-10-15 ENCOUNTER — Other Ambulatory Visit: Payer: Self-pay

## 2023-10-15 ENCOUNTER — Emergency Department: Payer: Medicaid Other

## 2023-10-15 ENCOUNTER — Observation Stay
Admission: EM | Admit: 2023-10-15 | Discharge: 2023-10-16 | Disposition: A | Discharge status: Home / Self Care | Payer: Medicaid Other | Attending: Internal Medicine | Admitting: Internal Medicine

## 2023-10-15 ENCOUNTER — Encounter: Payer: Self-pay | Admitting: Medical Oncology

## 2023-10-15 DIAGNOSIS — N921 Excessive and frequent menstruation with irregular cycle: Secondary | ICD-10-CM | POA: Diagnosis not present

## 2023-10-15 DIAGNOSIS — M329 Systemic lupus erythematosus, unspecified: Secondary | ICD-10-CM | POA: Diagnosis not present

## 2023-10-15 DIAGNOSIS — M797 Fibromyalgia: Secondary | ICD-10-CM | POA: Diagnosis present

## 2023-10-15 DIAGNOSIS — A419 Sepsis, unspecified organism: Secondary | ICD-10-CM

## 2023-10-15 DIAGNOSIS — R531 Weakness: Secondary | ICD-10-CM | POA: Diagnosis not present

## 2023-10-15 DIAGNOSIS — R6510 Systemic inflammatory response syndrome (SIRS) of non-infectious origin without acute organ dysfunction: Secondary | ICD-10-CM | POA: Diagnosis not present

## 2023-10-15 DIAGNOSIS — G4733 Obstructive sleep apnea (adult) (pediatric): Secondary | ICD-10-CM

## 2023-10-15 DIAGNOSIS — Z944 Liver transplant status: Secondary | ICD-10-CM

## 2023-10-15 DIAGNOSIS — Z8572 Personal history of non-Hodgkin lymphomas: Secondary | ICD-10-CM | POA: Insufficient documentation

## 2023-10-15 DIAGNOSIS — C8330 Diffuse large B-cell lymphoma, unspecified site: Secondary | ICD-10-CM | POA: Diagnosis present

## 2023-10-15 DIAGNOSIS — G500 Trigeminal neuralgia: Secondary | ICD-10-CM | POA: Diagnosis not present

## 2023-10-15 DIAGNOSIS — C8580 Other specified types of non-Hodgkin lymphoma, unspecified site: Secondary | ICD-10-CM | POA: Diagnosis present

## 2023-10-15 DIAGNOSIS — Z452 Encounter for adjustment and management of vascular access device: Secondary | ICD-10-CM | POA: Diagnosis not present

## 2023-10-15 DIAGNOSIS — I10 Essential (primary) hypertension: Secondary | ICD-10-CM | POA: Diagnosis not present

## 2023-10-15 DIAGNOSIS — E785 Hyperlipidemia, unspecified: Secondary | ICD-10-CM | POA: Diagnosis not present

## 2023-10-15 DIAGNOSIS — N6019 Diffuse cystic mastopathy of unspecified breast: Secondary | ICD-10-CM | POA: Diagnosis not present

## 2023-10-15 DIAGNOSIS — J111 Influenza due to unidentified influenza virus with other respiratory manifestations: Secondary | ICD-10-CM

## 2023-10-15 DIAGNOSIS — M791 Myalgia, unspecified site: Secondary | ICD-10-CM | POA: Insufficient documentation

## 2023-10-15 DIAGNOSIS — K429 Umbilical hernia without obstruction or gangrene: Secondary | ICD-10-CM | POA: Diagnosis not present

## 2023-10-15 DIAGNOSIS — F11288 Opioid dependence with other opioid-induced disorder: Secondary | ICD-10-CM | POA: Diagnosis not present

## 2023-10-15 DIAGNOSIS — N3281 Overactive bladder: Secondary | ICD-10-CM | POA: Diagnosis not present

## 2023-10-15 DIAGNOSIS — M3500 Sicca syndrome, unspecified: Secondary | ICD-10-CM | POA: Diagnosis not present

## 2023-10-15 DIAGNOSIS — R7989 Other specified abnormal findings of blood chemistry: Secondary | ICD-10-CM | POA: Insufficient documentation

## 2023-10-15 DIAGNOSIS — G8929 Other chronic pain: Secondary | ICD-10-CM | POA: Diagnosis present

## 2023-10-15 DIAGNOSIS — R944 Abnormal results of kidney function studies: Secondary | ICD-10-CM | POA: Insufficient documentation

## 2023-10-15 DIAGNOSIS — Z1152 Encounter for screening for COVID-19: Secondary | ICD-10-CM | POA: Diagnosis not present

## 2023-10-15 DIAGNOSIS — E119 Type 2 diabetes mellitus without complications: Secondary | ICD-10-CM | POA: Diagnosis not present

## 2023-10-15 DIAGNOSIS — F1129 Opioid dependence with unspecified opioid-induced disorder: Secondary | ICD-10-CM | POA: Diagnosis present

## 2023-10-15 DIAGNOSIS — J101 Influenza due to other identified influenza virus with other respiratory manifestations: Secondary | ICD-10-CM | POA: Diagnosis not present

## 2023-10-15 DIAGNOSIS — F339 Major depressive disorder, recurrent, unspecified: Secondary | ICD-10-CM | POA: Diagnosis present

## 2023-10-15 DIAGNOSIS — R7401 Elevation of levels of liver transaminase levels: Secondary | ICD-10-CM | POA: Insufficient documentation

## 2023-10-15 DIAGNOSIS — G4700 Insomnia, unspecified: Secondary | ICD-10-CM | POA: Diagnosis present

## 2023-10-15 DIAGNOSIS — R509 Fever, unspecified: Secondary | ICD-10-CM | POA: Diagnosis not present

## 2023-10-15 DIAGNOSIS — E669 Obesity, unspecified: Secondary | ICD-10-CM | POA: Diagnosis present

## 2023-10-15 DIAGNOSIS — F411 Generalized anxiety disorder: Secondary | ICD-10-CM | POA: Diagnosis present

## 2023-10-15 LAB — COMPREHENSIVE METABOLIC PANEL
ALT: 23 U/L (ref 0–44)
AST: 44 U/L — ABNORMAL HIGH (ref 15–41)
Albumin: 3.9 g/dL (ref 3.5–5.0)
Alkaline Phosphatase: 76 U/L (ref 38–126)
Anion gap: 11 (ref 5–15)
BUN: 24 mg/dL — ABNORMAL HIGH (ref 6–20)
CO2: 24 mmol/L (ref 22–32)
Calcium: 9.2 mg/dL (ref 8.9–10.3)
Chloride: 98 mmol/L (ref 98–111)
Creatinine, Ser: 1.2 mg/dL — ABNORMAL HIGH (ref 0.44–1.00)
GFR, Estimated: 55 mL/min — ABNORMAL LOW (ref >60)
Glucose, Bld: 361 mg/dL — ABNORMAL HIGH (ref 70–99)
Potassium: 5.5 mmol/L — ABNORMAL HIGH (ref 3.5–5.1)
Sodium: 133 mmol/L — ABNORMAL LOW (ref 135–145)
Total Bilirubin: 1 mg/dL (ref 0.0–1.2)
Total Protein: 7.2 g/dL (ref 6.5–8.1)

## 2023-10-15 LAB — CBC WITH DIFFERENTIAL/PLATELET
Abs Immature Granulocytes: 0.03 10*3/uL (ref 0.00–0.07)
Basophils Absolute: 0 10*3/uL (ref 0.0–0.1)
Basophils Relative: 1 %
Eosinophils Absolute: 0.2 10*3/uL (ref 0.0–0.5)
Eosinophils Relative: 3 %
HCT: 37.6 % (ref 36.0–46.0)
Hemoglobin: 12.5 g/dL (ref 12.0–15.0)
Immature Granulocytes: 1 %
Lymphocytes Relative: 20 %
Lymphs Abs: 1.2 10*3/uL (ref 0.7–4.0)
MCH: 28.2 pg (ref 26.0–34.0)
MCHC: 33.2 g/dL (ref 30.0–36.0)
MCV: 84.7 fL (ref 80.0–100.0)
Monocytes Absolute: 0.9 10*3/uL (ref 0.1–1.0)
Monocytes Relative: 16 %
Neutro Abs: 3.7 10*3/uL (ref 1.7–7.7)
Neutrophils Relative %: 59 %
Platelets: 106 10*3/uL — ABNORMAL LOW (ref 150–400)
RBC: 4.44 MIL/uL (ref 3.87–5.11)
RDW: 14.9 % (ref 11.5–15.5)
WBC: 6 10*3/uL (ref 4.0–10.5)
nRBC: 0 % (ref 0.0–0.2)

## 2023-10-15 LAB — URINALYSIS, ROUTINE W REFLEX MICROSCOPIC
Bacteria, UA: NONE SEEN
Bilirubin Urine: NEGATIVE
Glucose, UA: 150 mg/dL — AB
Hgb urine dipstick: NEGATIVE
Ketones, ur: NEGATIVE mg/dL
Leukocytes,Ua: NEGATIVE
Nitrite: NEGATIVE
Protein, ur: 100 mg/dL — AB
Specific Gravity, Urine: 1.012 (ref 1.005–1.030)
pH: 6 (ref 5.0–8.0)

## 2023-10-15 LAB — RESP PANEL BY RT-PCR (RSV, FLU A&B, COVID)  RVPGX2
Influenza A by PCR: POSITIVE — AB
Influenza B by PCR: NEGATIVE
Resp Syncytial Virus by PCR: NEGATIVE
SARS Coronavirus 2 by RT PCR: NEGATIVE

## 2023-10-15 LAB — PROTIME-INR
INR: 1.1 (ref 0.8–1.2)
Prothrombin Time: 14.6 s (ref 11.4–15.2)

## 2023-10-15 LAB — AMMONIA: Ammonia: 22 umol/L (ref 9–35)

## 2023-10-15 LAB — CK: Total CK: 89 U/L (ref 38–234)

## 2023-10-15 LAB — APTT: aPTT: 59 s — ABNORMAL HIGH (ref 24–36)

## 2023-10-15 LAB — CBG MONITORING, ED
Glucose-Capillary: 347 mg/dL — ABNORMAL HIGH (ref 70–99)
Glucose-Capillary: 319 mg/dL — ABNORMAL HIGH (ref 70–99)

## 2023-10-15 LAB — GROUP A STREP BY PCR: Group A Strep by PCR: NOT DETECTED

## 2023-10-15 LAB — PROCALCITONIN: Procalcitonin: <0.1 ng/mL

## 2023-10-15 LAB — LACTIC ACID, PLASMA: Lactic Acid, Venous: 1.3 mmol/L (ref 0.5–1.9)

## 2023-10-15 LAB — MRSA NEXT GEN BY PCR, NASAL: MRSA by PCR Next Gen: NOT DETECTED

## 2023-10-15 MED ORDER — TACROLIMUS 1 MG PO CAPS
1.0000 mg | ORAL_CAPSULE | Freq: Every morning | ORAL | Status: DC
  Administered 2023-10-16: 1 mg via ORAL
  Filled 2023-10-15: qty 1

## 2023-10-15 MED ORDER — INSULIN GLARGINE-YFGN 100 UNIT/ML ~~LOC~~ SOLN
40.0000 [IU] | Freq: Every day | SUBCUTANEOUS | Status: DC
  Administered 2023-10-15: 40 [IU] via SUBCUTANEOUS
  Filled 2023-10-15 (×2): qty 0.4

## 2023-10-15 MED ORDER — HYDRALAZINE HCL 20 MG/ML IJ SOLN: 5.0000 mg | Freq: Four times a day (QID) | INTRAMUSCULAR | Status: DC | PRN

## 2023-10-15 MED ORDER — MIRTAZAPINE 15 MG PO TABS
15.0000 mg | ORAL_TABLET | Freq: Every day | ORAL | Status: DC
  Administered 2023-10-15: 15 mg via ORAL
  Filled 2023-10-15: qty 1

## 2023-10-15 MED ORDER — VENLAFAXINE HCL ER 75 MG PO CP24
75.0000 mg | ORAL_CAPSULE | Freq: Every day | ORAL | Status: DC
  Administered 2023-10-16: 75 mg via ORAL
  Filled 2023-10-15: qty 1

## 2023-10-15 MED ORDER — METRONIDAZOLE 500 MG/100ML IV SOLN
500.0000 mg | Freq: Once | INTRAVENOUS | Status: AC
  Administered 2023-10-15: 500 mg via INTRAVENOUS
  Filled 2023-10-15: qty 100

## 2023-10-15 MED ORDER — ONDANSETRON HCL 4 MG/2ML IJ SOLN: 4.0000 mg | Freq: Four times a day (QID) | INTRAMUSCULAR | Status: DC | PRN

## 2023-10-15 MED ORDER — ONDANSETRON HCL 4 MG PO TABS: 4.0000 mg | ORAL_TABLET | Freq: Four times a day (QID) | ORAL | Status: DC | PRN

## 2023-10-15 MED ORDER — HEPARIN SODIUM (PORCINE) 5000 UNIT/ML IJ SOLN
5000.0000 [IU] | Freq: Three times a day (TID) | INTRAMUSCULAR | Status: DC
  Administered 2023-10-15 – 2023-10-16 (×2): 5000 [IU] via SUBCUTANEOUS
  Filled 2023-10-15 (×2): qty 1

## 2023-10-15 MED ORDER — ACETAMINOPHEN 325 MG PO TABS
650.0000 mg | ORAL_TABLET | Freq: Once | ORAL | Status: AC | PRN
  Administered 2023-10-15: 650 mg via ORAL
  Filled 2023-10-15: qty 2

## 2023-10-15 MED ORDER — SENNOSIDES-DOCUSATE SODIUM 8.6-50 MG PO TABS: 1.0000 | ORAL_TABLET | Freq: Every evening | ORAL | Status: DC | PRN

## 2023-10-15 MED ORDER — VANCOMYCIN HCL IN DEXTROSE 1-5 GM/200ML-% IV SOLN: 1000.0000 mg | INTRAVENOUS | Status: DC

## 2023-10-15 MED ORDER — LACTATED RINGERS IV SOLN: INTRAVENOUS | Status: AC

## 2023-10-15 MED ORDER — ACETAMINOPHEN 650 MG RE SUPP: 650.0000 mg | Freq: Four times a day (QID) | RECTAL | Status: DC | PRN

## 2023-10-15 MED ORDER — ALBUTEROL SULFATE (2.5 MG/3ML) 0.083% IN NEBU: 2.5000 mg | INHALATION_SOLUTION | Freq: Four times a day (QID) | RESPIRATORY_TRACT | Status: DC | PRN

## 2023-10-15 MED ORDER — MIRABEGRON ER 50 MG PO TB24
50.0000 mg | ORAL_TABLET | Freq: Every day | ORAL | Status: DC
  Administered 2023-10-15 – 2023-10-16 (×2): 50 mg via ORAL
  Filled 2023-10-15 (×2): qty 1

## 2023-10-15 MED ORDER — SODIUM CHLORIDE 0.9 % IV SOLN
2.0000 g | Freq: Once | INTRAVENOUS | Status: AC
  Administered 2023-10-15: 2 g via INTRAVENOUS
  Filled 2023-10-15: qty 12.5

## 2023-10-15 MED ORDER — SODIUM CHLORIDE 0.9 % IV BOLUS
1000.0000 mL | Freq: Once | INTRAVENOUS | Status: AC
  Administered 2023-10-15: 1000 mL via INTRAVENOUS

## 2023-10-15 MED ORDER — MELATONIN 5 MG PO TABS
10.0000 mg | ORAL_TABLET | Freq: Every day | ORAL | Status: DC
  Administered 2023-10-15: 10 mg via ORAL
  Filled 2023-10-15: qty 2

## 2023-10-15 MED ORDER — BENZTROPINE MESYLATE 1 MG PO TABS: 1.0000 mg | ORAL_TABLET | Freq: Two times a day (BID) | ORAL | Status: DC | PRN

## 2023-10-15 MED ORDER — PREGABALIN 50 MG PO CAPS
200.0000 mg | ORAL_CAPSULE | Freq: Two times a day (BID) | ORAL | Status: DC
  Administered 2023-10-15 – 2023-10-16 (×2): 200 mg via ORAL
  Filled 2023-10-15 (×2): qty 4

## 2023-10-15 MED ORDER — ACETAMINOPHEN 325 MG PO TABS
650.0000 mg | ORAL_TABLET | Freq: Four times a day (QID) | ORAL | Status: DC | PRN
  Administered 2023-10-15: 650 mg via ORAL
  Filled 2023-10-15: qty 2

## 2023-10-15 MED ORDER — INSULIN ASPART 100 UNIT/ML IJ SOLN
0.0000 [IU] | Freq: Every day | INTRAMUSCULAR | Status: DC
Start: 2023-10-15 — End: 2023-10-16
  Administered 2023-10-15: 4 [IU] via SUBCUTANEOUS
  Filled 2023-10-15: qty 1

## 2023-10-15 MED ORDER — BASAGLAR KWIKPEN 100 UNIT/ML ~~LOC~~ SOPN
40.0000 [IU] | PEN_INJECTOR | Freq: Every day | SUBCUTANEOUS | Status: DC
  Filled 2023-10-15: qty 3

## 2023-10-15 MED ORDER — EZETIMIBE 10 MG PO TABS
10.0000 mg | ORAL_TABLET | ORAL | Status: DC
  Administered 2023-10-16: 10 mg via ORAL
  Filled 2023-10-15: qty 1

## 2023-10-15 MED ORDER — OSELTAMIVIR PHOSPHATE 75 MG PO CAPS
75.0000 mg | ORAL_CAPSULE | Freq: Two times a day (BID) | ORAL | Status: DC
  Administered 2023-10-15 – 2023-10-16 (×3): 75 mg via ORAL
  Filled 2023-10-15 (×3): qty 1

## 2023-10-15 MED ORDER — VITAMIN D 25 MCG (1000 UNIT) PO TABS
5000.0000 [IU] | ORAL_TABLET | Freq: Every day | ORAL | Status: DC
  Administered 2023-10-16: 5000 [IU] via ORAL
  Filled 2023-10-15: qty 5

## 2023-10-15 MED ORDER — SODIUM CHLORIDE 0.9 % IV SOLN
2.0000 g | Freq: Three times a day (TID) | INTRAVENOUS | Status: DC
  Administered 2023-10-15 – 2023-10-16 (×3): 2 g via INTRAVENOUS
  Filled 2023-10-15 (×3): qty 12.5

## 2023-10-15 MED ORDER — VANCOMYCIN HCL 1500 MG/300ML IV SOLN
1500.0000 mg | INTRAVENOUS | Status: DC
  Filled 2023-10-15: qty 300

## 2023-10-15 MED ORDER — BENZONATATE 100 MG PO CAPS: 200.0000 mg | ORAL_CAPSULE | Freq: Two times a day (BID) | ORAL | Status: DC | PRN

## 2023-10-15 MED ORDER — OXYCODONE HCL 5 MG PO TABS: 5.0000 mg | ORAL_TABLET | Freq: Three times a day (TID) | ORAL | Status: DC | PRN

## 2023-10-15 MED ORDER — FLUTICASONE PROPIONATE 50 MCG/ACT NA SUSP
1.0000 | Freq: Every day | NASAL | Status: DC
  Administered 2023-10-16: 1 via NASAL
  Filled 2023-10-15: qty 16

## 2023-10-15 MED ORDER — INSULIN ASPART 100 UNIT/ML IJ SOLN
0.0000 [IU] | Freq: Three times a day (TID) | INTRAMUSCULAR | Status: DC
  Administered 2023-10-15: 11 [IU] via SUBCUTANEOUS
  Administered 2023-10-16: 5 [IU] via SUBCUTANEOUS
  Administered 2023-10-16: 15 [IU] via SUBCUTANEOUS
  Filled 2023-10-15 (×3): qty 1

## 2023-10-15 MED ORDER — LURASIDONE HCL 20 MG PO TABS
20.0000 mg | ORAL_TABLET | Freq: Every day | ORAL | Status: DC
  Administered 2023-10-15 – 2023-10-16 (×2): 20 mg via ORAL
  Filled 2023-10-15 (×2): qty 1

## 2023-10-15 NOTE — Assessment & Plan Note (Signed)
 -  Insulin SSI with at bedtime coverage ordered ?- Goal inpatient blood glucose levels 140-180 ?

## 2023-10-15 NOTE — Assessment & Plan Note (Signed)
Home melatonin 10 mg nightly resumed

## 2023-10-15 NOTE — Progress Notes (Signed)
 Pharmacy Antibiotic Note  Alison [name] is a 48 y.o. female admitted on 10/15/2023 with sepsis.  Patient presenting with cough and chills. 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 In ED, patient is febrile to 100.9, WBC 6.0. Pharmacy has been consulted for cefepime  dosing.  Plan: Start cefepime  2 g IV every 8 hours based on current renal function Monitor renal function, clinical status, culture data, and LOT  Height: 5' 6 (167.6 cm) Weight: 111.1 kg (245 lb) IBW/kg (Calculated) : 59.3  Temp (24hrs), Avg:101.2 F (38.4 C), Min:100.9 F (38.3 C), Max:101.5 F (38.6 C)  Recent Labs  Lab 10/15/23 1031  WBC 6.0  CREATININE 1.20*  LATICACIDVEN 1.3    Estimated Creatinine Clearance: 71.6 mL/min (A) (by C-G formula based on SCr of 1.2 mg/dL (H)).    Allergies  Allergen Reactions   Penicillin G Itching, Rash, Anaphylaxis and Palpitations   Lamictal  [Lamotrigine ] Rash   Tape    Carbamazepine Other (See Comments)    Medication interaction-prograf     Hydrocodone-Acetaminophen  Itching   Naproxen Itching   Penicillins    Vancomycin  Rash    Macular/blotchy rash at infusion site   Antimicrobials this admission: cefepime  1/8 >>   Dose adjustments this admission: N/A  Microbiology results: 1/8 BCx: pending 1/8 MRSA PCR: pending  Thank you for involving pharmacy in this patient's care.   Damien Napoleon, PharmD Clinical Pharmacist 10/15/2023 1:43 PM

## 2023-10-15 NOTE — ED Notes (Signed)
 Pt attempting to stand up with RN at bedside. Pt not able to bear weight due to shakiness and being unsteady. Pt normally ambulates independently.

## 2023-10-15 NOTE — ED Provider Notes (Signed)
 [facility] Provider Note    Event Date/Time   First MD Initiated Contact with Patient 10/15/23 1007     (approximate)   History   Cough, Chills, and Shaking   HPI  Alison [name] is a 48 y.o. female with history of liver transplant with port in place who comes in with concerns for multiple symptoms.  Patient reports that she has been having the cough, chills and shaking since yesterday.  She states that because of her symptoms her body is trembling and she is having difficulties even ambulating.  Her partner had to help get her to the toilet.  She reports that from a liver perspective she has been compliant with her Prograf .  She reports that this was done in [place]  many years ago.  She does not drink alcohol.   Physical Exam   Triage Vital Signs: ED Triage Vitals  Encounter Vitals Group     BP 10/15/23 0931 (!) 146/68     Systolic BP Percentile --      Diastolic BP Percentile --      Pulse Rate 10/15/23 0931 100     Resp 10/15/23 0931 20     Temp 10/15/23 0931 (!) 101.5 F (38.6 C)     Temp Source 10/15/23 0931 Oral     SpO2 10/15/23 0931 95 %     Weight 10/15/23 0932 245 lb (111.1 kg)     Height 10/15/23 0932 5' 6 (1.676 m)     Head Circumference --      Peak Flow --      Pain Score 10/15/23 0932 0     Pain Loc --      Pain Education --      Exclude from Growth Chart --     Most recent vital signs: Vitals:   10/15/23 0931 10/15/23 1031  BP: (!) 146/68   Pulse: 100   Resp: 20   Temp: (!) 101.5 F (38.6 C) (!) 100.9 F (38.3 C)  SpO2: 95%      General: Awake, no distress.  CV:  Good peripheral perfusion.  Resp:  Normal effort.  Abd:  No distention.  Soft and nontender Other:  Patient trysto lift up her leg and has some trembling noted in bilateral legs.  No weakness noted.  Equal strength in arms.   ED Results / Procedures / Treatments   Labs (all labs ordered are listed, but only abnormal results are displayed) Labs  Reviewed  RESP PANEL BY RT-PCR (RSV, FLU A&B, COVID)  [id number] - Abnormal; Notable for the following components:      Result Value   Influenza A by PCR POSITIVE (*)    All other components within normal limits  CULTURE, BLOOD (ROUTINE X 2)  CULTURE, BLOOD (ROUTINE X 2)  GROUP A STREP BY PCR  URINALYSIS, ROUTINE W REFLEX MICROSCOPIC  LACTIC ACID, PLASMA  LACTIC ACID, PLASMA  COMPREHENSIVE METABOLIC PANEL  CBC WITH DIFFERENTIAL/PLATELET  PROTIME-INR  APTT  PROCALCITONIN  TACROLIMUS  LEVEL  AMMONIA  CK     RADIOLOGY I have reviewed the xray personally and interpreted and no PNA   PROCEDURES:  Critical Care performed: Yes, see critical care procedure note(s)  .Critical Care  Performed by: Ernest Ronal BRAVO, MD Authorized by: Ernest Ronal BRAVO, MD   Critical care provider statement:    Critical care was necessary to treat or prevent imminent or life-threatening deterioration of the following conditions:  Sepsis    MEDICATIONS ORDERED IN  ED: Medications  sodium chloride  0.9 % bolus 1,000 mL (has no administration in time range)  lactated ringers  infusion (has no administration in time range)  ceFEPIme  (MAXIPIME ) 2 g in sodium chloride  0.9 % 100 mL IVPB (has no administration in time range)  metroNIDAZOLE  (FLAGYL ) IVPB 500 mg (has no administration in time range)  acetaminophen  (TYLENOL ) tablet 650 mg (650 mg Oral Given 10/15/23 0935)     IMPRESSION / MDM / ASSESSMENT AND PLAN / ED COURSE  I reviewed the triage vital signs and the nursing notes.   Patient's presentation is most consistent with acute presentation with potential threat to life or bodily function.   Patient comes in immunosuppressed with fever, tachycardia, difficulties ambulating.  Given patient's immunosuppressed status broad-spectrum antibiotics, fluid, Tylenol  were started.  Workup was done to evaluate for sepsis.  Patient is flu positive.  Will need to see how symptomatically patient can do to see if she can go  home and ensure there is nothing else going on given she is immunosuppressed.  Ammonia is normal, lactate is normal.  CMP shows slight elevation of her creatinine with potassium of 5.5.  Patient given some fluids and suspect this is from dehydration.  Her glucose is elevated at 360 but anion gap is normal.  CBC is normal.  Patient is unable to ambulate.  Will discuss with hospitalist for admission.  The patient is on the cardiac monitor to evaluate for evidence of arrhythmia and/or significant heart rate changes.      FINAL CLINICAL IMPRESSION(S) / ED DIAGNOSES   Final diagnoses:  Weakness  Flu     Rx / DC Orders   ED Discharge Orders     None        Note:  This document was prepared using Dragon voice recognition software and may include unintentional dictation errors.   Ernest Ronal BRAVO, MD 10/15/23 [phone]

## 2023-10-15 NOTE — Progress Notes (Signed)
 CODE SEPSIS - PHARMACY COMMUNICATION  **Broad Spectrum Antibiotics should be administered within 1 hour of Sepsis diagnosis**  Time Code Sepsis Called/Page Received: 1019  Antibiotics Ordered: cefepime  and metronidazole   Time of 1st antibiotic administration: 1125  Additional action taken by pharmacy: Messaged RN - delayed due to difficulty with IV access  If necessary, Name of Provider/Nurse Contacted: N/A    Damien Napoleon ,PharmD Clinical Pharmacist  10/15/2023  11:55 AM

## 2023-10-15 NOTE — Assessment & Plan Note (Signed)
 Home tacrolimus 1 mg every morning resumed on admission

## 2023-10-15 NOTE — ED Triage Notes (Signed)
 Pt reports shakes, chills and cough since yesterday.

## 2023-10-15 NOTE — ED Notes (Signed)
 Pt to xray

## 2023-10-15 NOTE — Assessment & Plan Note (Signed)
 CPAP nightly ordered

## 2023-10-15 NOTE — Assessment & Plan Note (Signed)
 Suspect secondary to influenza A Continue with aggressive fluid hydration Status post sodium chloride 2 L bolus per EDP Continue with LR 150 mL/h, 20 hours ordered

## 2023-10-15 NOTE — Assessment & Plan Note (Signed)
 Home pregabalin 200 mg p.o. twice daily, oxycodone 5 mg p.o. 3 times daily as needed for severe pain resumed PDMP reviewed

## 2023-10-15 NOTE — ED Notes (Signed)
 This RN to bedside to introduce self to pt. Pt ie caox4, and in no acute distress. Pt has order for cardiac monitoring and not currently on it. This RN placed her on leads and called CCMD.

## 2023-10-15 NOTE — H&P (Signed)
 History and Physical   Alison [name] FMW:[id number] DOB: 01-01-1976 DOA: 10/15/2023  PCP: Gareth Mliss FALCON, FNP  Outpatient Specialists: Dr. Jannett Fairly, [facility] clinic neurology Patient coming from: Home  I have personally briefly reviewed patient's old medical records in [facility] EMR.  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  Vitals in the ED showed Tmax of 101.5, improved to 100.9, respiration rate of 20, heart rate of 100, blood pressure 146/68, SpO2 of 95% on room air.  Serum sodium is 133, potassium 5.5, chloride 98, bicarb 24, BUN of 24, serum creatinine 1.20, EGFR 55, nonfasting blood glucose 361, WBC 6.0, hemoglobin 12.5, platelets of 106.  AST is 44.  Blood cultures x 2 are in process.  Ammonia is 22.  Lactic acid 1.3.  Strep a was negative.  Patient tested positive for influenza A.  ED treatment: Cefepime , Flagyl , sodium chloride  1 L bolus. -------------------------------------- At bedside, patient able to tell me her name, age, location, current calendar year.  She reports that she developed the symptoms yesterday.  She reports that she was so weak, her husband had to help her to the toilet.  She endorses compliance with her home medications.  She denies trauma to her person.  She endorses the weakness is generalized.  She reports she has never felt this way with the flu before.  She endorses that she has had the influenza vaccine this year.  Social history: She is at home with her husband of 20  years.  She denies tobacco, EtOH, recreational drug use.  ROS: Constitutional: no weight change, no fever ENT/Mouth: no sore throat, no rhinorrhea Eyes: no eye pain, no vision changes Cardiovascular: no chest pain, no dyspnea,  no edema, no palpitations Respiratory: no cough, no sputum, no wheezing Gastrointestinal: no nausea, no vomiting, no diarrhea, no constipation Genitourinary: no urinary incontinence, no dysuria, no hematuria Musculoskeletal: no arthralgias, + myalgias Skin: no skin lesions, no pruritus, Neuro: + weakness, no loss of consciousness, no syncope Psych: no anxiety, no depression, + decrease appetite Heme/Lymph: no bruising, no bleeding  ED Course: Discussed with EDP, patient requiring hospitalization for chief concerns of meeting SIRS/sepsis criteria.  Assessment/Plan  Principal Problem:   SIRS (systemic inflammatory response syndrome) (HCC) Active Problems:   Fibromyalgia   Systemic lupus erythematosus (SLE) in adult [facility])   Essential hypertension   Hyperlipidemia   Trigeminal neuralgia   OSA on CPAP   Fibrocystic breast   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HCC)   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HCC)   Major depressive disorder, recurrent (HCC)   Morbid obesity (HCC)   S/P liver transplant (HCC)   Obesity (BMI 35.0-39.9 without comorbidity)   Umbilical hernia without obstruction and without gangrene   Opioid dependence with opioid-induced disorder (HCC)   GAD (generalized anxiety disorder)   Insomnia   Menorrhagia with irregular cycle   Sjogren's syndrome (HCC)   Insulin  dependent type 2 diabetes mellitus (HCC)   Overactive bladder   Elevated serum creatinine   Myalgia   Elevated LFTs   Assessment and Plan:  * SIRS (systemic inflammatory response syndrome) (  HCC) Etiology workup in progress Patient met criteria with Tmax of 101.5 and on repeat was 100.9, increased respiration rate and heart rate, source is unclear at this time In setting of  admission, sepsis cannot be excluded at this time therefore Blood cultures x 2 are in process, Check procalcitonin, MRSA PCR Patient tested positive for influenza A on admission  Elevated LFTs Holding home statin medication on admission  Myalgia Suspect secondary to influenza A Continue with aggressive fluid hydration Status post sodium chloride  2 L bolus per EDP Continue with LR 150 mL/h, 20 hours ordered  Insulin  dependent type 2 diabetes mellitus (HCC) Insulin  SSI with at bedtime coverage ordered Goal inpatient blood glucose levels 140-180  Insomnia Home melatonin 10 mg nightly resumed  Opioid dependence with opioid-induced disorder (HCC) PDMP reviewed  Obesity (BMI 35.0-39.9 without comorbidity) This complicates overall care and prognosis.   S/P liver transplant (HCC) Home tacrolimus  1 mg every morning resumed on admission  OSA on CPAP CPAP nightly ordered  Hyperlipidemia Ezetimibe  10 mg in the morning resumed Home rosuvastatin  10 mg daily not resumed on admission due to myalgia secondary to influenza AM team to resume when the benefits outweigh the risk  Fibromyalgia Home pregabalin  200 mg p.o. twice daily, oxycodone  5 mg p.o. 3 times daily as needed for severe pain resumed PDMP reviewed  Chart reviewed.   DVT prophylaxis: Heparin  5000 units subcutaneous every 8 hours Code Status: Full code Diet: Heart healthy/carb modified vegetarian diet Family Communication: A phone call was offered, patient declined stating that her husband already knows she is being admitted to the hospital Disposition Plan: Pending clinical course Consults called: Pharmacy for medication relabeling Admission status: Telemetry cardiac, inpatient  Past Medical History:  Diagnosis Date   Abnormal uterine bleeding    Allergy    Anxiety    Arthritis    Bipolar disorder (manic depression) (HCC)    Chronic kidney failure    Chronic renal disease, stage III (HCC)    Diabetes mellitus  without complication (HCC)    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HCC) 2015   Right axillary lymph node resected and chemo tx's.   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 02/2023 Echo: EF 55-60%, no rwma, nl RV size/fxn. Mildly dil LA.   Personality disorder (HCC)    Pineal gland cyst    Post herpetic neuralgia    PTSD (post-traumatic stress disorder)    Renal disorder    S/P liver transplant (HCC) 1993   Sleep apnea    has not recieved cpap yet-other one was recalled   Past Surgical History:  Procedure Laterality Date   BONE MARROW BIOPSY  01/13/2015   BREAST BIOPSY  12/2014   BREAST BIOPSY  2011   BREAST SURGERY     CHOLECYSTECTOMY     COLONOSCOPY     COLONOSCOPY WITH PROPOFOL  N/A 02/28/2022   Procedure: COLONOSCOPY WITH PROPOFOL ;  Surgeon: Jinny,  Darren, MD;  Location: [facility] ENDOSCOPY;  Service: Endoscopy;  Laterality: N/A;   ESOPHAGOGASTRODUODENOSCOPY     ESOPHAGOGASTRODUODENOSCOPY (EGD) WITH PROPOFOL  N/A 01/09/2021   Procedure: ESOPHAGOGASTRODUODENOSCOPY (EGD) WITH PROPOFOL ;  Surgeon: Jinny Carmine, MD;  Location: [facility] ENDOSCOPY;  Service: Endoscopy;  Laterality: N/A;   HERNIA REPAIR     x4-all from liver transplant   HYSTEROSCOPY WITH D & C N/A 11/13/2021   Procedure: DILATATION AND CURETTAGE /HYSTEROSCOPY;  Surgeon: Victor Claudell SAUNDERS, MD;  Location: [facility] ORS;  Service: Gynecology;  Laterality: N/A;   infusaport     LIVER TRANSPLANT  12/17/1991   LUMBAR PUNCTURE     PORT A CATH INJECTION ([facility] HX)     RENAL BIOPSY     tumor removal  2015   cancer right side    Social History:  reports that she has never smoked. She has never used smokeless tobacco. She reports that she does not currently use alcohol. She reports that she does not currently use drugs.  Allergies  Allergen Reactions   Penicillin G Itching, Rash, Anaphylaxis and Palpitations   Lamictal  [Lamotrigine ] Rash   Tape    Carbamazepine Other (See Comments)    Medication interaction-prograf     Hydrocodone-Acetaminophen  Itching   Naproxen Itching   Penicillins    Vancomycin  Rash    Macular/blotchy rash at infusion site   Family History  Problem Relation Age of Onset   Heart disease Mother    Hypertension Mother    Cancer - Other Mother    Bipolar disorder Mother    Heart disease Father    Hypertension Father    Diabetes Father    Parkinson's disease Maternal Grandmother    Cancer Maternal Aunt    Cancer Maternal Uncle    Cancer Maternal Grandfather    Lupus Paternal Grandmother    Hypertension Brother    Family history: Family history reviewed and not pertinent.  Prior to Admission medications   Medication Sig Start Date End Date Taking? Authorizing Provider  albuterol  (VENTOLIN  HFA) 108 (90 Base) MCG/ACT inhaler Inhale 2 puffs into the lungs every 6 (six) hours as needed for wheezing or shortness of breath. 09/08/23  Yes Gareth Mliss FALCON, FNP  baclofen  (LIORESAL ) 10 MG tablet Take 1 tablet by mouth 2 (two) times daily. 08/15/21  Yes [provider]  benzonatate  (TESSALON ) 100 MG capsule Take 2 capsules (200 mg total) by mouth 2 (two) times daily as needed for cough. 09/08/23  Yes Gareth Mliss FALCON, FNP  benztropine  (COGENTIN ) 1 MG tablet TAKE 1 TABLET BY MOUTH ONCE DAILY AS NEEDED FOR TREMORS 08/01/23  Yes [name], [name], MD  Cholecalciferol  (VITAMIN D ) 125 MCG (5000 UT) CAPS Take 5,000 Units by mouth daily.   Yes [provider]  Cranberry-Vitamin C-Probiotic (AZO CRANBERRY PO) Take by mouth.   Yes [provider]  ezetimibe  (ZETIA ) 10 MG tablet  Take 10 mg by mouth every morning. 04/09/21  Yes [provider]  fluticasone  (FLONASE ) 50 MCG/ACT nasal spray Place into the nose. 10/30/21  Yes [provider]  ibuprofen  (ADVIL ) 400 MG tablet Take 1 tablet (400 mg total) by mouth every 6 (six) hours as needed. 03/11/23  Yes Darci Pore, MD  Insulin  Glargine (BASAGLAR  KWIKPEN) 100 UNIT/ML Inject 40 Units into the skin at bedtime. 03/03/23  Yes [provider]  lisinopril  (ZESTRIL ) 20 MG tablet TAKE 1 TABLET BY MOUTH ONCE DAILY 08/01/23  Yes Pender, Julie F, FNP  lurasidone  (LATUDA ) 20 MG TABS tablet TAKE 1 TABLET BY  MOUTH ONCE DAILY WITH SUPPER *REFILL REQUEST* 06/04/23  Yes [name], [name], MD  magnesium  oxide (MAG-OX) 400 MG tablet Take by mouth.   Yes [provider]  Melatonin 10 MG TABS Take 10 mg by mouth at bedtime.   Yes [provider]  metFORMIN  (GLUCOPHAGE ) 1000 MG tablet Take 1 tablet (1,000 mg total) by mouth 2 (two) times daily with a meal. 01/22/23  Yes Gareth Mliss FALCON, FNP  mirabegron  ER (MYRBETRIQ ) 50 MG TB24 tablet Take 1 tablet (50 mg total) by mouth daily. 01/22/23  Yes Gareth Mliss FALCON, FNP  mirtazapine  (REMERON ) 15 MG tablet TAKE 1 TABLET BY MOUTH EVERY DAY AT BEDTIME *REFILL REQUEST* 06/04/23  Yes [name], [name], MD  naloxone  (NARCAN ) nasal spray 4 mg/0.1 mL Place 0.4 mg into the nose once. 09/05/21  Yes [provider]  NOVOLOG  FLEXPEN 100 UNIT/ML FlexPen Inject 0-30 Units into the skin 3 (three) times daily with meals. Sliding scale 01/21/23  Yes [provider]  oxyCODONE  (OXY IR/ROXICODONE ) 5 MG immediate release tablet Take 5 mg by mouth 3 (three) times daily as needed.   Yes [provider]  pregabalin  (LYRICA ) 200 MG capsule Take 1 capsule (200 mg total) by mouth 2 (two) times daily. 12/03/21  Yes Floy Roberts, MD  rosuvastatin  (CRESTOR ) 10 MG tablet Take 1 tablet (10 mg total) by mouth daily. 08/21/23  Yes Agbor-Etang, Redell, MD  tacrolimus   (PROGRAF ) 1 MG capsule Take 1 capsule by mouth every morning. 02/20/23  Yes [provider]  UBRELVY  50 MG TABS Take 1 tablet by mouth daily as needed. 01/19/22  Yes [provider]  venlafaxine  XR (EFFEXOR -XR) 75 MG 24 hr capsule Take 1 capsule (75 mg total) by mouth daily with breakfast. Take along with 37.5 mg daily 09/09/23  Yes [name], [name], MD  XTAMPZA  ER 9 MG C12A Take 1 capsule by mouth every 12 (twelve) hours. 08/07/22  Yes [provider]  Xylitol (XYLIMELTS) 550 MG DISK Take by mouth. 07/20/21  Yes [provider]  Accu-Chek Softclix Lancets lancets 2 (two) times daily. 06/01/23   [provider]  colchicine  0.6 MG tablet Take by mouth. Patient not taking: Reported on 10/15/2023 07/02/23   [provider]  doxycycline  (VIBRAMYCIN ) 100 MG capsule Take 1 capsule (100 mg total) by mouth 2 (two) times daily. Patient not taking: Reported on 10/15/2023 09/12/23   Sofia, Leslie K, PA-C  dronabinol (MARINOL) 2.5 MG capsule Take 2.5 mg by mouth See admin instructions. Takes every other day Patient not taking: Reported on 09/08/2023    [provider]  Dulaglutide  4.5 MG/0.5ML SOPN Inject 4.5 mg into the skin once a week. Patient not taking: Reported on 10/15/2023 11/28/21   [provider]  icosapent  Ethyl (VASCEPA ) 1 g capsule Take 2 capsules (2 g total) by mouth 2 (two) times daily. Patient not taking: Reported on 09/12/2023 09/07/21   Darliss Redell, MD  Insulin  Degludec (TRESIBA) 100 UNIT/ML SOLN Inject 30 Units into the skin. Patient not taking: Reported on 10/15/2023    [provider]  Insulin  Pen Needle 32G X 6 MM MISC 1 each by Does not apply route as needed. 05/22/23   Gareth Mliss FALCON, FNP  meloxicam  (MOBIC ) 15 MG tablet Take 1 tablet (15 mg total) by mouth daily. Patient not taking: Reported on 10/15/2023 10/21/22 10/21/23  Cuthriell, Dorn BIRCH, PA-C  methocarbamol  (ROBAXIN ) 500 MG tablet Take 1 tablet (500 mg total)  by mouth 4 (four) times daily. Patient not taking:  Reported on 10/15/2023 10/21/22   Cuthriell, Dorn BIRCH, PA-C  PRECISION QID TEST test strip  08/06/19   [provider]  tacrolimus  (PROGRAF ) 1 mg/mL SUSP 1 Milligram(s) Oral every 24 hours. 08/16/13   [provider]  TOUJEO  SOLOSTAR 300 UNIT/ML Solostar Pen Inject into the skin. Patient not taking: Reported on 10/15/2023 07/05/23   [provider]  venlafaxine  XR (EFFEXOR -XR) 37.5 MG 24 hr capsule Take 1 capsule (37.5 mg total) by mouth daily with breakfast. Please take along with 75 mg p.o. daily. Patient not taking: Reported on 10/15/2023 09/09/23   [name], [name], MD   Physical Exam: Vitals:   10/15/23 1630 10/15/23 1701 10/15/23 1800 10/15/23 1801  BP: (!) 105/41   106/60  Pulse: 89  81   Resp: (!) 29  (!) 38   Temp:  (!) 100.8 F (38.2 C)    TempSrc:  Oral    SpO2: 100%  98%   Weight:      Height:       Constitutional: appears older than chronological age, chronically ill, acutely weak Eyes: PERRL, lids and conjunctivae normal ENMT: Mucous membranes are moist. Posterior pharynx clear of any exudate or lesions. Age-appropriate dentition. Hearing appropriate Neck: normal, supple, no masses, no thyromegaly Respiratory: clear to auscultation bilaterally, no wheezing, no crackles. Normal respiratory effort. No accessory muscle use.  Cardiovascular: Regular rate and rhythm, no murmurs / rubs / gallops. No extremity edema. 2+ pedal pulses. No carotid bruits.  Abdomen: Morbidly obese abdomen no tenderness, no masses palpated, no hepatosplenomegaly. Bowel sounds positive.  Musculoskeletal: no clubbing / cyanosis. No joint deformity upper and lower extremities. Good ROM, no contractures, no atrophy. Normal muscle tone.  Skin: no rashes, lesions, ulcers. No induration Neurologic: Sensation intact. Strength 5/5 in all 4.  Patient able to flex and extend her knees and her ankles. Psychiatric: Normal judgment and insight.  Alert and oriented x 3. Normal mood.   EKG: independently reviewed, showing sinus rhythm with rate of 92, QTc 504  Chest x-ray on Admission: I personally reviewed and I agree with radiologist reading as below.  DG Chest 2 View Result Date: 10/15/2023 CLINICAL DATA:  Fever and uncontrollable shakes. EXAM: CHEST - 2 VIEW COMPARISON:  Chest radiograph dated 01/11/2023. FINDINGS: Left-sided Port-A-Cath in similar position. There is shallow inspiration. No focal consolidation, pleural effusion, or pneumothorax. Stable cardiac silhouette. No acute osseous pathology. IMPRESSION: No active cardiopulmonary disease. Electronically Signed   By: Vanetta Chou M.D.   On: 10/15/2023 10:55   Labs on Admission: I have personally reviewed following labs  CBC: Recent Labs  Lab 10/15/23 1031  WBC 6.0  NEUTROABS 3.7  HGB 12.5  HCT 37.6  MCV 84.7  PLT 106*   Basic Metabolic Panel: Recent Labs  Lab 10/15/23 1031  NA 133*  K 5.5*  CL 98  CO2 24  GLUCOSE 361*  BUN 24*  CREATININE 1.20*  CALCIUM  9.2   GFR: Estimated Creatinine Clearance: 71.6 mL/min (A) (by C-G formula based on SCr of 1.2 mg/dL (H)).  Liver Function Tests: Recent Labs  Lab 10/15/23 1031  AST 44*  ALT 23  ALKPHOS 76  BILITOT 1.0  PROT 7.2  ALBUMIN 3.9   Recent Labs  Lab 10/15/23 1031  AMMONIA 22   Coagulation Profile: Recent Labs  Lab 10/15/23 1031  INR 1.1   Urine analysis:    Component Value Date/Time   COLORURINE YELLOW (A) 10/15/2023 1031   APPEARANCEUR CLEAR (A) 10/15/2023 1031   APPEARANCEUR  Clear 02/12/2022 1125   LABSPEC 1.012 10/15/2023 1031   PHURINE 6.0 10/15/2023 1031   GLUCOSEU 150 (A) 10/15/2023 1031   HGBUR NEGATIVE 10/15/2023 1031   BILIRUBINUR NEGATIVE 10/15/2023 1031   BILIRUBINUR Negative 02/12/2022 1125   KETONESUR NEGATIVE 10/15/2023 1031   PROTEINUR 100 (A) 10/15/2023 1031   UROBILINOGEN 0.2 08/29/2021 1514   NITRITE NEGATIVE 10/15/2023 1031   LEUKOCYTESUR NEGATIVE 10/15/2023  1031   This document was prepared using Dragon Voice Recognition software and may include unintentional dictation errors.  Dr. Sherre [facility]  If 7PM-7AM, please contact overnight-coverage provider If 7AM-7PM, please contact day attending provider [email or web address]  10/15/2023, 6:58 PM

## 2023-10-15 NOTE — Hospital Course (Addendum)
 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  Also has associated generalized weakness  Vitals in the ED showed Tmax of 101.5, improved to 100.9, respiration rate of 20, heart rate of 100, blood pressure 146/68, SpO2 of 95% on room air.  Serum sodium is 133, potassium 5.5, chloride 98, bicarb 24, BUN of 24, serum creatinine 1.20, EGFR 55, nonfasting blood glucose 361, WBC 6.0, hemoglobin 12.5, platelets of 106. AST is 44.  Blood cultures x 2 are in process.  Ammonia is 22.  Lactic acid 1.3.  Strep a was negative.  Patient tested positive for influenza A.  UA negative for UTI, did show protein urea . CXR was negative for any acute abnormality.  ED treatment: Cefepime , Flagyl , sodium chloride  1 L bolus.  1/9: Vital stable, patient met sepsis criteria with fever, mild tachycardia and tachypnea, due to influenza A.,  Procalcitonin negative, preliminary blood cultures negative, CBC with all cell line decreased, platelet at 87, some dilutional effect and influenza.  Hyperkalemia resolved.  MRSA PCR negative. Procalcitonin remain negative x 2 so cefepime  has been discontinued.  Patient just has flow at this time and need 5 days of Tamiflu  along with supportive care.  Remained on room air.  Mild cough and upper respiratory symptoms which can be managed from or over-the-counter supportive management.  PT also evaluated her with no specific follow-up recommendations.  Patient is being discharged with the addition of Tamiflu  and she will continue with rest of her home  medications as she was doing it before and follow-up with her providers for further management.

## 2023-10-15 NOTE — ED Notes (Signed)
 Difficulty obtaining 2nd set of blood cultures and labs. Second RN to attempt

## 2023-10-15 NOTE — Assessment & Plan Note (Signed)
 -  This complicates overall care and prognosis.

## 2023-10-15 NOTE — Assessment & Plan Note (Signed)
 Holding home statin medication on admission

## 2023-10-15 NOTE — Assessment & Plan Note (Signed)
 PDMP reviewed.

## 2023-10-15 NOTE — ED Notes (Signed)
 Pt assisted to bedside toilet. Pt HR and RR increaSed during exertion to the toilet.

## 2023-10-15 NOTE — Assessment & Plan Note (Signed)
 Ezetimibe 10 mg in the morning resumed Home rosuvastatin 10 mg daily not resumed on admission due to myalgia secondary to influenza AM team to resume when the benefits outweigh the risk

## 2023-10-15 NOTE — Assessment & Plan Note (Signed)
 Etiology workup in progress Patient met criteria with Tmax of 101.5 and on repeat was 100.9, increased respiration rate and heart rate, source is unclear at this time In setting of admission, sepsis cannot be excluded at this time therefore Blood cultures x 2 are in process, Check procalcitonin, MRSA PCR Patient tested positive for influenza A on admission

## 2023-10-15 NOTE — Sepsis Progress Note (Signed)
 eLink is following this Code Sepsis.

## 2023-10-16 DIAGNOSIS — Z794 Long term (current) use of insulin: Secondary | ICD-10-CM

## 2023-10-16 DIAGNOSIS — J111 Influenza due to unidentified influenza virus with other respiratory manifestations: Secondary | ICD-10-CM | POA: Diagnosis not present

## 2023-10-16 DIAGNOSIS — R531 Weakness: Secondary | ICD-10-CM | POA: Diagnosis not present

## 2023-10-16 DIAGNOSIS — C8330 Diffuse large B-cell lymphoma, unspecified site: Secondary | ICD-10-CM

## 2023-10-16 DIAGNOSIS — R6510 Systemic inflammatory response syndrome (SIRS) of non-infectious origin without acute organ dysfunction: Secondary | ICD-10-CM | POA: Diagnosis not present

## 2023-10-16 DIAGNOSIS — F1129 Opioid dependence with unspecified opioid-induced disorder: Secondary | ICD-10-CM

## 2023-10-16 DIAGNOSIS — E119 Type 2 diabetes mellitus without complications: Secondary | ICD-10-CM | POA: Diagnosis not present

## 2023-10-16 DIAGNOSIS — I10 Essential (primary) hypertension: Secondary | ICD-10-CM | POA: Diagnosis not present

## 2023-10-16 DIAGNOSIS — G4701 Insomnia due to medical condition: Secondary | ICD-10-CM | POA: Diagnosis not present

## 2023-10-16 DIAGNOSIS — R7989 Other specified abnormal findings of blood chemistry: Secondary | ICD-10-CM

## 2023-10-16 DIAGNOSIS — E669 Obesity, unspecified: Secondary | ICD-10-CM | POA: Diagnosis not present

## 2023-10-16 DIAGNOSIS — A419 Sepsis, unspecified organism: Secondary | ICD-10-CM | POA: Diagnosis not present

## 2023-10-16 DIAGNOSIS — M791 Myalgia, unspecified site: Secondary | ICD-10-CM

## 2023-10-16 LAB — PROCALCITONIN: Procalcitonin: <0.1 ng/mL

## 2023-10-16 LAB — CBC
HCT: 33.5 % — ABNORMAL LOW (ref 36.0–46.0)
Hemoglobin: 10.9 g/dL — ABNORMAL LOW (ref 12.0–15.0)
MCH: 28.2 pg (ref 26.0–34.0)
MCHC: 32.5 g/dL (ref 30.0–36.0)
MCV: 86.8 fL (ref 80.0–100.0)
Platelets: 87 10*3/uL — ABNORMAL LOW (ref 150–400)
RBC: 3.86 MIL/uL — ABNORMAL LOW (ref 3.87–5.11)
RDW: 15 % (ref 11.5–15.5)
WBC: 3.8 10*3/uL — ABNORMAL LOW (ref 4.0–10.5)
nRBC: 0 % (ref 0.0–0.2)

## 2023-10-16 LAB — HEPATIC FUNCTION PANEL
ALT: 20 U/L (ref 0–44)
AST: 33 U/L (ref 15–41)
Albumin: 3.1 g/dL — ABNORMAL LOW (ref 3.5–5.0)
Alkaline Phosphatase: 59 U/L (ref 38–126)
Bilirubin, Direct: 0.2 mg/dL (ref 0.0–0.2)
Indirect Bilirubin: 0.2 mg/dL — ABNORMAL LOW (ref 0.3–0.9)
Total Bilirubin: 0.4 mg/dL (ref 0.0–1.2)
Total Protein: 6 g/dL — ABNORMAL LOW (ref 6.5–8.1)

## 2023-10-16 LAB — HEMOGLOBIN A1C
Hgb A1c MFr Bld: 10.8 % — ABNORMAL HIGH (ref 4.8–5.6)
Mean Plasma Glucose: 263.26 mg/dL

## 2023-10-16 LAB — CBG MONITORING, ED
Glucose-Capillary: 231 mg/dL — ABNORMAL HIGH (ref 70–99)
Glucose-Capillary: 398 mg/dL — ABNORMAL HIGH (ref 70–99)

## 2023-10-16 LAB — BASIC METABOLIC PANEL
Anion gap: 10 (ref 5–15)
BUN: 17 mg/dL (ref 6–20)
CO2: 22 mmol/L (ref 22–32)
Calcium: 8.5 mg/dL — ABNORMAL LOW (ref 8.9–10.3)
Chloride: 104 mmol/L (ref 98–111)
Creatinine, Ser: 0.97 mg/dL (ref 0.44–1.00)
GFR, Estimated: >60 mL/min (ref >60)
Glucose, Bld: 237 mg/dL — ABNORMAL HIGH (ref 70–99)
Potassium: 4 mmol/L (ref 3.5–5.1)
Sodium: 136 mmol/L (ref 135–145)

## 2023-10-16 MED ORDER — INSULIN ASPART 100 UNIT/ML IJ SOLN
5.0000 [IU] | Freq: Three times a day (TID) | INTRAMUSCULAR | Status: DC
  Administered 2023-10-16: 5 [IU] via SUBCUTANEOUS
  Filled 2023-10-16: qty 1

## 2023-10-16 MED ORDER — HEPARIN SOD (PORK) LOCK FLUSH 10 UNIT/ML IV SOLN
10.0000 [IU] | Freq: Once | INTRAVENOUS | Status: AC
  Administered 2023-10-16: 10 [IU]
  Filled 2023-10-16: qty 5

## 2023-10-16 MED ORDER — ONDANSETRON HCL 4 MG PO TABS: 4.0000 mg | ORAL_TABLET | Freq: Four times a day (QID) | ORAL | 0 refills | Status: AC | PRN

## 2023-10-16 MED ORDER — OSELTAMIVIR PHOSPHATE 75 MG PO CAPS: 75.0000 mg | ORAL_CAPSULE | Freq: Two times a day (BID) | ORAL | 0 refills | Status: AC

## 2023-10-16 NOTE — Evaluation (Signed)
 Physical Therapy Evaluation & Discharge  Patient Details Name: Alison [name] MRN: [id number] DOB: 1976/08/01 Today's Date: 10/16/2023  History of Present Illness  48 y/o female presented to ED on 10/15/23 for fever and shaking chills. Admitted for SIRS. PMH: T2DM, opioid dependence, S/p liver transplant, OSA on CPAP, fibromyalgia, HTN, systemic lupus erythematosus  Clinical Impression  Patient admitted with the above. PTA, patient lives with husband and brother in law and reports independence with mobility and ADLs with intermittent use of SPC and w/c for long distances. Patient functioning at modI level with no AD but decreased gait speed. No further skilled PT needs identified acutely. PT will sign off.         If plan is discharge home, recommend the following:     Can travel by private vehicle        Equipment Recommendations None recommended by PT  Recommendations for Other Services       Functional Status Assessment Patient has not had a recent decline in their functional status     Precautions / Restrictions Precautions Precautions: None Restrictions Weight Bearing Restrictions Per Provider Order: No      Mobility  Bed Mobility Overal bed mobility: Modified Independent                  Transfers Overall transfer level: Modified independent                      Ambulation/Gait Ambulation/Gait assistance: Modified independent (Device/Increase time) Gait Distance (Feet): 150 Feet Assistive device: None Gait Pattern/deviations: Step-through pattern, Decreased stride length Gait velocity: decreased        Stairs            Wheelchair Mobility     Tilt Bed    Modified Rankin (Stroke Patients Only)       Balance Overall balance assessment: Mild deficits observed, not formally tested                                           Pertinent Vitals/Pain Pain Assessment Pain Assessment: No/denies pain    Home  Living Family/patient expects to be discharged to:: Private residence Living Arrangements: Spouse/significant other Available Help at Discharge: Family Type of Home: Apartment (3rd floor) Home Access: Elevator       Home Layout: One level Home Equipment: Cane - single point;Wheelchair - manual      Prior Function Prior Level of Function : Independent/Modified Independent;Driving             Mobility Comments: uses SPC at times. W/c for long distances       Extremity/Trunk Assessment   Upper Extremity Assessment Upper Extremity Assessment: Overall WFL for tasks assessed    Lower Extremity Assessment Lower Extremity Assessment: Generalized weakness    Cervical / Trunk Assessment Cervical / Trunk Assessment: Normal  Communication   Communication Communication: No apparent difficulties  Cognition Arousal: Alert Behavior During Therapy: WFL for tasks assessed/performed Overall Cognitive Status: Within Functional Limits for tasks assessed                                          General Comments      Exercises     Assessment/Plan    PT Assessment Patient does not  need any further PT services  PT Problem List         PT Treatment Interventions      PT Goals (Current goals can be found in the Care Plan section)  Acute Rehab PT Goals Patient Stated Goal: to feel better PT Goal Formulation: All assessment and education complete, DC therapy    Frequency       Co-evaluation               AM-PAC PT 6 Clicks Mobility  Outcome Measure Help needed turning from your back to your side while in a flat bed without using bedrails?: None Help needed moving from lying on your back to sitting on the side of a flat bed without using bedrails?: None Help needed moving to and from a bed to a chair (including a wheelchair)?: None Help needed standing up from a chair using your arms (e.g., wheelchair or bedside chair)?: None Help needed to walk  in hospital room?: None Help needed climbing 3-5 steps with a railing? : None 6 Click Score: 24    End of Session   Activity Tolerance: Patient tolerated treatment well Patient left: in bed;with call bell/phone within reach Nurse Communication: Mobility status PT Visit Diagnosis: Muscle weakness (generalized) (M62.81)    Time: 8857-8845 PT Time Calculation (min) (ACUTE ONLY): 12 min   Charges:   PT Evaluation $PT Eval Low Complexity: 1 Low   PT General Charges $$ ACUTE PT VISIT: 1 Visit         Maryanne Finder, PT, DPT Physical Therapist - [facility]  [facility]   Devlin Mcveigh A Demari Kropp 10/16/2023, 12:08 PM

## 2023-10-16 NOTE — Discharge Summary (Signed)
 Physician Discharge Summary   [name] MRN: [id number] DOB: 05-20-1976  Admit date:     10/15/2023  Discharge date: 10/16/23  Discharge Physician: Alison [name]   PCP: Alison Mliss FALCON, FNP   Recommendations at discharge:  Please obtain CBC and BMP on follow-up Patient is diagnosed with flu, given Tamiflu , please ensure the completion of course Follow-up with primary care provider within a week  Discharge Diagnoses: Principal Problem:   SIRS (systemic inflammatory response syndrome) (HCC) Active Problems:   Fibromyalgia   Systemic lupus erythematosus (SLE) in adult [facility])   Essential hypertension   Hyperlipidemia   Trigeminal neuralgia   OSA on CPAP   Fibrocystic breast   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HCC)   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HCC)   Major depressive disorder, recurrent (HCC)   Morbid obesity (HCC)   S/P liver transplant (HCC)   Obesity (BMI 35.0-39.9 without comorbidity)   Umbilical hernia without obstruction and without gangrene   Opioid dependence with opioid-induced disorder (HCC)   GAD (generalized anxiety disorder)   Insomnia   Menorrhagia with irregular cycle   Sjogren's syndrome (HCC)   Insulin  dependent type 2 diabetes mellitus (HCC)   Overactive bladder   Elevated serum creatinine   Myalgia   Elevated LFTs   Weakness   Flu   Hospital Course: Alison [name] is a 48 year old female with history of morbid obesity, 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  Also has associated generalized weakness  Vitals in the ED showed Tmax of 101.5, improved to 100.9,  respiration rate of 20, heart rate of 100, blood pressure 146/68, SpO2 of 95% on room air.  Serum sodium is 133, potassium 5.5, chloride 98, bicarb 24, BUN of 24, serum creatinine 1.20, EGFR 55, nonfasting blood glucose 361, WBC 6.0, hemoglobin 12.5, platelets of 106. AST is 44.  Blood cultures x 2 are in process.  Ammonia is 22.  Lactic acid 1.3.  Strep a was negative.  Patient tested positive for influenza A.  UA negative for UTI, did show protein urea . CXR was negative for any acute abnormality.  ED treatment: Cefepime , Flagyl , sodium chloride  1 L bolus.  1/9: Vital stable, patient met sepsis criteria with fever, mild tachycardia and tachypnea, due to influenza A.,  Procalcitonin negative, preliminary blood cultures negative, CBC with all cell line decreased, platelet at 87, some dilutional effect and influenza.  Hyperkalemia resolved.  MRSA PCR negative. Procalcitonin remain negative x 2 so cefepime  has been discontinued.  Patient just has flow at this time and need 5 days of Tamiflu  along with supportive care.  Remained on room air.  Mild cough and upper respiratory symptoms which can be managed from or over-the-counter supportive management.  PT also evaluated her with no specific follow-up recommendations.  Patient is being discharged with the addition of Tamiflu  and she will continue with rest of her home medications as she was doing it before and follow-up with her providers for further management.  Assessment and Plan: * SIRS (systemic inflammatory response syndrome) (HCC) Patient met sepsis criteria with fever, mild tachycardia and tachypnea, likely due to flu. As procalcitonin remain negative x 2.  No need for any antibiotics at  this time. -Completed 5-day course of Tamiflu   Elevated LFTs Holding home statin medication on admission  Myalgia Suspect secondary to influenza A Patient received IV fluid  Insulin  dependent type 2 diabetes mellitus (HCC) Insulin  SSI with at bedtime  coverage ordered Goal inpatient blood glucose levels 140-180  Insomnia Home melatonin 10 mg nightly resumed  Opioid dependence with opioid-induced disorder (HCC) PDMP reviewed  Obesity (BMI 35.0-39.9 without comorbidity) This complicates overall care and prognosis.   S/P liver transplant (HCC) Home tacrolimus  1 mg every morning resumed on admission  OSA on CPAP CPAP nightly ordered  Hyperlipidemia Ezetimibe  10 mg in the morning resumed Home rosuvastatin  10 mg daily not resumed on admission due to myalgia secondary to influenza AM team to resume when the benefits outweigh the risk  Fibromyalgia Home pregabalin  200 mg p.o. twice daily, oxycodone  5 mg p.o. 3 times daily as needed for severe pain resumed PDMP reviewed  Consultants: None Procedures performed: None Disposition: Home Diet recommendation:  Discharge Diet Orders (From admission, onward)     Start     Ordered   10/16/23 0000  Diet - low sodium heart healthy        10/16/23 1222           Cardiac and Carb modified diet DISCHARGE MEDICATION: Allergies as of 10/16/2023       Reactions   Penicillin G Itching, Rash, Anaphylaxis, Palpitations   Lamictal  [lamotrigine ] Rash   Tape    Carbamazepine Other (See Comments)   Medication interaction-prograf    Hydrocodone-acetaminophen  Itching   Naproxen Itching   Penicillins    Vancomycin  Rash   Macular/blotchy rash at infusion site        Medication List     STOP taking these medications    colchicine  0.6 MG tablet   doxycycline  100 MG capsule Commonly known as: VIBRAMYCIN    dronabinol 2.5 MG capsule Commonly known as: MARINOL   Dulaglutide  4.5 MG/0.5ML Soaj   icosapent  Ethyl 1 g capsule Commonly known as: VASCEPA    meloxicam  15 MG tablet Commonly known as: MOBIC    methocarbamol  500 MG tablet Commonly known as: ROBAXIN    Tresiba 100 UNIT/ML Soln Generic drug: Insulin  Degludec       TAKE these medications    Accu-Chek Softclix Lancets  lancets 2 (two) times daily.   albuterol  108 (90 Base) MCG/ACT inhaler Commonly known as: VENTOLIN  HFA Inhale 2 puffs into the lungs every 6 (six) hours as needed for wheezing or shortness of breath.   AZO CRANBERRY PO Take by mouth.   baclofen  10 MG tablet Commonly known as: LIORESAL  Take 1 tablet by mouth 2 (two) times daily.   Basaglar  KwikPen 100 UNIT/ML Inject 40 Units into the skin at bedtime. What changed: Another medication with the same name was removed. Continue taking this medication, and follow the directions you see here.   benzonatate  100 MG capsule Commonly known as: TESSALON  Take 2 capsules (200 mg total) by mouth 2 (two) times daily as needed for cough.   benztropine  1 MG tablet Commonly known as: COGENTIN  TAKE 1 TABLET BY MOUTH ONCE DAILY AS NEEDED FOR TREMORS   ezetimibe  10 MG tablet Commonly known as: ZETIA  Take 10 mg by mouth every morning.   fluticasone  50 MCG/ACT nasal spray Commonly known as: FLONASE  Place into the nose.   ibuprofen  400 MG tablet Commonly known as: ADVIL  Take 1 tablet (400 mg total) by mouth every 6 (six) hours as needed.   Insulin  Pen Needle 32G X 6 MM  Misc 1 each by Does not apply route as needed.   lisinopril  20 MG tablet Commonly known as: ZESTRIL  TAKE 1 TABLET BY MOUTH ONCE DAILY   lurasidone  20 MG Tabs tablet Commonly known as: LATUDA  TAKE 1 TABLET BY MOUTH ONCE DAILY WITH SUPPER *REFILL REQUEST*   magnesium  oxide 400 MG tablet Commonly known as: MAG-OX Take by mouth.   Melatonin 10 MG Tabs Take 10 mg by mouth at bedtime.   metFORMIN  1000 MG tablet Commonly known as: GLUCOPHAGE  Take 1 tablet (1,000 mg total) by mouth 2 (two) times daily with a meal.   mirabegron  ER 50 MG Tb24 tablet Commonly known as: Myrbetriq  Take 1 tablet (50 mg total) by mouth daily.   mirtazapine  15 MG tablet Commonly known as: REMERON  TAKE 1 TABLET BY MOUTH EVERY DAY AT BEDTIME *REFILL REQUEST*   naloxone  4 MG/0.1ML Liqd nasal  spray kit Commonly known as: NARCAN  Place 0.4 mg into the nose once.   NovoLOG  FlexPen 100 UNIT/ML FlexPen Generic drug: insulin  aspart Inject 0-30 Units into the skin 3 (three) times daily with meals. Sliding scale   ondansetron  4 MG tablet Commonly known as: ZOFRAN  Take 1 tablet (4 mg total) by mouth every 6 (six) hours as needed for nausea.   oseltamivir  75 MG capsule Commonly known as: TAMIFLU  Take 1 capsule (75 mg total) by mouth 2 (two) times daily for 5 days.   oxyCODONE  5 MG immediate release tablet Commonly known as: Oxy IR/ROXICODONE  Take 5 mg by mouth 3 (three) times daily as needed.   Precision QID Test test strip Generic drug: glucose blood   pregabalin  200 MG capsule Commonly known as: LYRICA  Take 1 capsule (200 mg total) by mouth 2 (two) times daily.   rosuvastatin  10 MG tablet Commonly known as: Crestor  Take 1 tablet (10 mg total) by mouth daily.   tacrolimus  1 MG capsule Commonly known as: PROGRAF  Take 1 capsule by mouth every morning. What changed: Another medication with the same name was removed. Continue taking this medication, and follow the directions you see here.   Ubrelvy  50 MG Tabs Generic drug: Ubrogepant  Take 1 tablet by mouth daily as needed.   venlafaxine  XR 75 MG 24 hr capsule Commonly known as: EFFEXOR -XR Take 1 capsule (75 mg total) by mouth daily with breakfast. Take along with 37.5 mg daily What changed: Another medication with the same name was removed. Continue taking this medication, and follow the directions you see here.   Vitamin D  125 MCG (5000 UT) Caps Take 5,000 Units by mouth daily.   Xtampza  ER 9 MG C12a Generic drug: oxyCODONE  ER Take 1 capsule by mouth every 12 (twelve) hours.   XyliMelts 550 MG Disk Generic drug: Xylitol Take by mouth.        Follow-up Information     Alison Mliss FALCON, FNP. Schedule an appointment as soon as possible for a visit in 1 week(s).   Specialty: Nurse Practitioner Contact  information: [address] [place] [address] [phone]                Discharge Exam: Fredricka Weights   10/15/23 0932  Weight: 111.1 kg   General.  Obese lady, in no acute distress. Pulmonary.  Lungs clear bilaterally, normal respiratory effort. CV.  Regular rate and rhythm, no JVD, rub or murmur. Abdomen.  Soft, nontender, nondistended, BS positive. CNS.  Alert and oriented .  No focal neurologic deficit. Extremities.  No edema, no cyanosis, pulses intact and symmetrical.  Psychiatry.  Judgment and insight appears normal.   Condition at discharge: stable  The results of significant diagnostics from this hospitalization (including imaging, microbiology, ancillary and laboratory) are listed below for reference.   Imaging Studies: DG Chest 2 View Result Date: 10/15/2023 CLINICAL DATA:  Fever and uncontrollable shakes. EXAM: CHEST - 2 VIEW COMPARISON:  Chest radiograph dated 01/11/2023. FINDINGS: Left-sided Port-A-Cath in similar position. There is shallow inspiration. No focal consolidation, pleural effusion, or pneumothorax. Stable cardiac silhouette. No acute osseous pathology. IMPRESSION: No active cardiopulmonary disease. Electronically Signed   By: Vanetta Chou M.D.   On: 10/15/2023 10:55    Microbiology: Results for orders placed or performed during the hospital encounter of 10/15/23  Resp panel by RT-PCR (RSV, Flu A&B, Covid) Anterior Nasal Swab     Status: Abnormal   Collection Time: 10/15/23  9:34 AM   Specimen: Anterior Nasal Swab  Result Value Ref Range Status   SARS Coronavirus 2 by RT PCR NEGATIVE NEGATIVE Final    Comment: (NOTE) SARS-CoV-2 target nucleic acids are NOT DETECTED.  The SARS-CoV-2 RNA is generally detectable in upper respiratory specimens during the acute phase of infection. The lowest concentration of SARS-CoV-2 viral copies this assay can detect is 138 copies/mL. A negative result does not preclude  SARS-Cov-2 infection and should not be used as the sole basis for treatment or other patient management decisions. A negative result may occur with  improper specimen collection/handling, submission of specimen other than nasopharyngeal swab, presence of viral mutation(s) within the areas targeted by this assay, and inadequate number of viral copies(<138 copies/mL). A negative result must be combined with clinical observations, patient history, and epidemiological information. The expected result is Negative.  Fact Sheet for Patients:  bloggercourse.com  Fact Sheet for Healthcare Providers:  seriousbroker.it  This test is no t yet approved or cleared by the [place]  FDA and  has been authorized for detection and/or diagnosis of SARS-CoV-2 by FDA under an Emergency Use Authorization (EUA). This EUA will remain  in effect (meaning this test can be used) for the duration of the COVID-19 declaration under Section 564(b)(1) of the Act, 21 U.S.C.section 360bbb-3(b)(1), unless the authorization is terminated  or revoked sooner.       Influenza A by PCR POSITIVE (A) NEGATIVE Final   Influenza B by PCR NEGATIVE NEGATIVE Final    Comment: (NOTE) The Xpert Xpress SARS-CoV-2/FLU/RSV plus assay is intended as an aid in the diagnosis of influenza from Nasopharyngeal swab specimens and should not be used as a sole basis for treatment. Nasal washings and aspirates are unacceptable for Xpert Xpress SARS-CoV-2/FLU/RSV testing.  Fact Sheet for Patients: bloggercourse.com  Fact Sheet for Healthcare Providers: seriousbroker.it  This test is not yet approved or cleared by the [place]  FDA and has been authorized for detection and/or diagnosis of SARS-CoV-2 by FDA under an Emergency Use Authorization (EUA). This EUA will remain in effect (meaning this test can be used) for the duration of  the COVID-19 declaration under Section 564(b)(1) of the Act, 21 U.S.C. section 360bbb-3(b)(1), unless the authorization is terminated or revoked.     Resp Syncytial Virus by PCR NEGATIVE NEGATIVE Final    Comment: (NOTE) Fact Sheet for Patients: bloggercourse.com  Fact Sheet for Healthcare Providers: seriousbroker.it  This test is not yet approved or cleared by the [place]  FDA and has been authorized for detection and/or diagnosis of SARS-CoV-2 by FDA under an Emergency Use Authorization (EUA). This EUA will remain in effect (meaning this  test can be used) for the duration of the COVID-19 declaration under Section 564(b)(1) of the Act, 21 U.S.C. section 360bbb-3(b)(1), unless the authorization is terminated or revoked.  Performed at [facility], [address] [place], [place] [address]   Blood Culture (routine x 2)     Status: None (Preliminary result)   Collection Time: 10/15/23 10:31 AM   Specimen: BLOOD  Result Value Ref Range Status   Specimen Description BLOOD BLOOD LEFT ARM  Final   Special Requests   Final    BOTTLES DRAWN AEROBIC AND ANAEROBIC Blood Culture results may not be optimal due to an inadequate volume of blood received in culture bottles   Culture   Final    NO GROWTH < 24 HOURS Performed at [facility], [address], [place], [place] [address]    Report Status PENDING  Incomplete  Blood Culture (routine x 2)     Status: None (Preliminary result)   Collection Time: 10/15/23 10:31 AM   Specimen: BLOOD  Result Value Ref Range Status   Specimen Description BLOOD  PORT  Final   Special Requests   Final    BOTTLES DRAWN AEROBIC AND ANAEROBIC Blood Culture results may not be optimal due to an inadequate volume of blood received in culture bottles   Culture   Final    NO GROWTH < 24 HOURS Performed at [facility], [address] [place], [place] [address]    Report  Status PENDING  Incomplete  Group A Strep by PCR ([facility] Only)     Status: None   Collection Time: 10/15/23 10:31 AM   Specimen: Throat; Sterile Swab  Result Value Ref Range Status   Group A Strep by PCR NOT DETECTED NOT DETECTED Final    Comment: Performed at [facility], [address] [place], [place] [address]  MRSA Next Gen by PCR, Nasal     Status: None   Collection Time: 10/15/23  4:02 PM   Specimen: Nasal Mucosa; Nasal Swab  Result Value Ref Range Status   MRSA by PCR Next Gen NOT DETECTED NOT DETECTED Final    Comment: (NOTE) The GeneXpert MRSA Assay (FDA approved for NASAL specimens only), is one component of a comprehensive MRSA colonization surveillance program. It is not intended to diagnose MRSA infection nor to guide or monitor treatment for MRSA infections. Test performance is not FDA approved in patients less than 58 years old. Performed at [facility], [address] [place], [place] [address]     Labs: CBC: Recent Labs  Lab 10/15/23 1031 10/16/23 0503  WBC 6.0 3.8*  NEUTROABS 3.7  --   HGB 12.5 10.9*  HCT 37.6 33.5*  MCV 84.7 86.8  PLT 106* 87*   Basic Metabolic Panel: Recent Labs  Lab 10/15/23 1031 10/16/23 0503  NA 133* 136  K 5.5* 4.0  CL 98 104  CO2 24 22  GLUCOSE 361* 237*  BUN 24* 17  CREATININE 1.20* 0.97  CALCIUM  9.2 8.5*   Liver Function Tests: Recent Labs  Lab 10/15/23 1031 10/16/23 0503  AST 44* 33  ALT 23 20  ALKPHOS 76 59  BILITOT 1.0 0.4  PROT 7.2 6.0*  ALBUMIN 3.9 3.1*   CBG: Recent Labs  Lab 10/15/23 1703 10/15/23 2205 10/16/23 0803 10/16/23 1130  GLUCAP 347* 319* 231* 398*    Discharge time spent: greater than 30 minutes.  This record has been created using Conservation officer, historic buildings. Errors have been sought  and corrected,but may not always be located. Such creation errors do not reflect on the standard of care.   Signed: Amaryllis Dare, MD [facility] 10/16/2023

## 2023-10-16 NOTE — TOC Transition Note (Signed)
 Transition of Care [facility]) - Discharge Note   Patient Details  Name: Alison [name] MRN: [id number] Date of Birth: 09-15-1976  Transition of Care [facility]) CM/SW Contact:  Lauraine JAYSON Carpen, LCSW Phone Number: 10/16/2023, 1:04 PM   Clinical Narrative:  Patient has orders to discharge home today. Readmission prevention screen complete. CSW met with patient. No supports at bedside. CSW introduced role and explained that discharge planning would be discussed. PCP is Mliss Spray, MD. Brother-in-law drives her to appointments. [facility]. No issues obtaining medications. Patient lives home with her husband and brother-in-law. No home health prior to admission. She uses a wide-base cane and wheelchair when needed. No further concerns. Her brother-in-law will take her home today. CSW signing off.   Final next level of care: Home/Self Care Barriers to Discharge: No Barriers Identified   Patient Goals and CMS Choice            Discharge Placement                Patient to be transferred to facility by: Brother-in-law   Patient and family notified of of transfer: 10/16/23  Discharge Plan and Services Additional resources added to the After Visit Summary for                                       Social Drivers of Health (SDOH) Interventions SDOH Screenings   Food Insecurity: No Food Insecurity (08/05/2023)  Housing: Low Risk  (08/05/2023)  Transportation Needs: No Transportation Needs (09/05/2023)  Utilities: Not At Risk (06/05/2023)  Alcohol Screen: Low Risk  (10/07/2023)  Depression (PHQ2-9): Low Risk  (09/08/2023)  Recent Concern: Depression (PHQ2-9) - Medium Risk (07/21/2023)  Financial Resource Strain: Low Risk  (10/07/2023)  Physical Activity: Insufficiently Active (03/25/2023)  Social Connections: Moderately Integrated (06/05/2023)  Stress: Stress Concern Present (04/29/2023)  Tobacco Use: Low Risk  (10/15/2023)  Health Literacy: Adequate Health  Literacy (04/29/2023)     Readmission Risk Interventions    10/16/2023    1:03 PM 03/08/2023    3:07 PM  Readmission Risk Prevention Plan  Transportation Screening Complete Complete  PCP or Specialist Appt within 3-5 Days Complete Complete  HRI or Home Care Consult  Complete  Social Work Consult for Recovery Care Planning/Counseling Complete Complete  Palliative Care Screening Not Applicable Complete  Medication Review Oceanographer) Complete Complete

## 2023-10-17 LAB — TACROLIMUS LEVEL: Tacrolimus (FK506) - LabCorp: 3.3 ng/mL (ref 2.0–20.0)

## 2023-10-20 LAB — CULTURE, BLOOD (ROUTINE X 2)
Culture: NO GROWTH
Culture: NO GROWTH

## 2023-10-23 DIAGNOSIS — F331 Major depressive disorder, recurrent, moderate: Secondary | ICD-10-CM | POA: Diagnosis not present

## 2023-10-23 DIAGNOSIS — F4310 Post-traumatic stress disorder, unspecified: Secondary | ICD-10-CM | POA: Diagnosis not present

## 2023-10-23 DIAGNOSIS — F411 Generalized anxiety disorder: Secondary | ICD-10-CM | POA: Diagnosis not present

## 2023-11-03 ENCOUNTER — Ambulatory Visit: Payer: Medicaid Other | Admitting: Psychiatry

## 2023-11-04 ENCOUNTER — Encounter: Payer: Self-pay | Admitting: Psychiatry

## 2023-11-04 ENCOUNTER — Ambulatory Visit (INDEPENDENT_AMBULATORY_CARE_PROVIDER_SITE_OTHER): Payer: Medicaid Other | Admitting: Psychiatry

## 2023-11-04 VITALS — BP 118/82 | HR 88 | Temp 98.6°F | Ht 66.0 in | Wt 246.2 lb

## 2023-11-04 DIAGNOSIS — Z9189 Other specified personal risk factors, not elsewhere classified: Secondary | ICD-10-CM

## 2023-11-04 DIAGNOSIS — F4310 Post-traumatic stress disorder, unspecified: Secondary | ICD-10-CM

## 2023-11-04 DIAGNOSIS — F3176 Bipolar disorder, in full remission, most recent episode depressed: Secondary | ICD-10-CM | POA: Diagnosis not present

## 2023-11-04 DIAGNOSIS — G4701 Insomnia due to medical condition: Secondary | ICD-10-CM | POA: Diagnosis not present

## 2023-11-04 DIAGNOSIS — F411 Generalized anxiety disorder: Secondary | ICD-10-CM | POA: Diagnosis not present

## 2023-11-04 NOTE — Progress Notes (Signed)
BH MD OP Progress Note  11/04/2023 1:59 PM Alison [name]  MRN:  [id number]  Chief Complaint:  Chief Complaint  Patient presents with   Follow-up   Depression   Anxiety   Medication Refill   HPI: Alison [name] is a 48 year old Caucasian female, lives in [place], has a history of bipolar disorder, PTSD, medical problems including GI, history of liver transplant, B-cell lymphoma, SLE, diabetes mellitus, stage IV chronic kidney disease, OSA, chronic migraine headaches, hyperlipidemia, hypertension was evaluated in office today.  She has been experiencing sleep disturbances for about a month, characterized by difficulty falling asleep and waking up two to three times per night. No new medications have been introduced that could be contributing to her sleep issues. Her current medications include Latuda 20 mg for bipolar disorder, venlafaxine 112.5 mg daily, and mirtazapine 15 mg at bedtime for anxiety and PTSD. She also uses melatonin and has a CPAP machine, which she does not use consistently due to discomfort from the mask.  CPAP mask issues likely contributing to sleep issues.  Her mood symptoms are managed with medication and regular therapy sessions with her therapist, Alison [name], via video every three to four weeks. She finds these sessions helpful in managing her mental health conditions. No recent depression symptoms or suicide attempts in the last twenty years.  She is on pregabalin (Lyrica) for pain and oxycodone 5 mg three times a day as needed. She previously used [place] but discontinued it about four months ago due to insurance issues.  Currently on oxycodone.  She was recently hospitalized for influenza A. She received antibiotics and antiviral medications and reports feeling better now, though she still has a residual cough.   Visit Diagnosis:    ICD-10-CM   1. Bipolar 1 disorder, depressed, full remission (HCC)  F31.76     2. PTSD (post-traumatic stress disorder)  F43.10      3. GAD (generalized anxiety disorder)  F41.1     4. Insomnia due to medical condition  G47.01    Mood, pain, obstructive sleep apnea noncompliant with CPAP    5. At risk for prolonged QT interval syndrome  Z91.89 EKG 12-Lead      Past Psychiatric History: I have reviewed past psychiatric history from progress note on 06/10/2019.  Past Medical History:  Past Medical History:  Diagnosis Date   Abnormal uterine bleeding    Allergy    Anxiety    Arthritis    Bipolar disorder (manic depression) (HCC)    Chronic kidney failure    Chronic renal disease, stage III (HCC)    Diabetes mellitus without complication (HCC)    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HCC) 2015   Right axillary lymph node resected and chemo tx's.   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 02/2023 Echo: EF 55-60%, no rwma, nl RV size/fxn. Mildly dil LA.   Personality disorder (HCC)    [place]  gland cyst    Post herpetic neuralgia    PTSD (post-traumatic stress disorder)    Renal disorder    S/P liver transplant (HCC) 1993   Sleep apnea    has not recieved cpap yet-other one was recalled    Past Surgical History:  Procedure Laterality Date   BONE MARROW BIOPSY  01/13/2015   BREAST BIOPSY  12/2014   BREAST BIOPSY  2011   BREAST SURGERY     CHOLECYSTECTOMY     COLONOSCOPY     COLONOSCOPY WITH PROPOFOL N/A 02/28/2022   Procedure: COLONOSCOPY WITH PROPOFOL;  Surgeon: Midge Minium, MD;  Location: [facility] ENDOSCOPY;  Service: Endoscopy;  Laterality: N/A;   ESOPHAGOGASTRODUODENOSCOPY      ESOPHAGOGASTRODUODENOSCOPY (EGD) WITH PROPOFOL N/A 01/09/2021   Procedure: ESOPHAGOGASTRODUODENOSCOPY (EGD) WITH PROPOFOL;  Surgeon: Midge Minium, MD;  Location: [facility] ENDOSCOPY;  Service: Endoscopy;  Laterality: N/A;   HERNIA REPAIR     x4-all from liver transplant   HYSTEROSCOPY WITH D & C N/A 11/13/2021   Procedure: DILATATION AND CURETTAGE /HYSTEROSCOPY;  Surgeon: Natale Milch, MD;  Location: [facility] ORS;  Service: Gynecology;  Laterality: N/A;   infusaport     LIVER TRANSPLANT  12/17/1991   LUMBAR PUNCTURE     PORT A CATH INJECTION ([facility] HX)     RENAL BIOPSY     tumor removal  2015   cancer right [name]    Family Psychiatric History: I have reviewed family psychiatric history from progress note on 06/10/2019.  Family History:  Family History  Problem Relation Age of Onset   Heart disease Mother    Hypertension Mother    Cancer - Other Mother    Bipolar disorder Mother    Heart disease Father    Hypertension Father    Diabetes Father    Parkinson's disease Maternal Grandmother    Cancer Maternal Aunt    Cancer Maternal Uncle    Cancer Maternal Grandfather    Lupus Paternal Grandmother    Hypertension Brother     Social History: I have reviewed social history from progress note on 06/10/2019. Social History   Socioeconomic History   Marital status: Single    Spouse name: Not on file   Number of children: 0   Years of education: 9   Highest education level: GED or equivalent  Occupational History   Occupation: disabled  Tobacco Use   Smoking status: Never   Smokeless tobacco: Never  Vaping Use   Vaping status: Never Used  Substance and Sexual Activity   Alcohol use: Not Currently   Drug use: Not Currently    Comment: pain managment last used in early april   Sexual activity: Not Currently  Other Topics Concern   Not on file  Social History Narrative   Not on file   Social Drivers of Health   Financial Resource Strain: Low Risk  (10/07/2023)   Overall  Financial Resource Strain (CARDIA)    Difficulty of Paying Living Expenses: Not very hard  Food Insecurity: No Food Insecurity (08/05/2023)   Hunger Vital Sign    Worried About Running Out of Food in the Last Year: Never true    Ran Out of Food in the Last Year: Never true  Transportation Needs: No Transportation Needs (09/05/2023)   PRAPARE - Administrator, Civil Service (Medical): No    Lack of Transportation (Non-Medical): No  Physical Activity: Insufficiently Active (03/25/2023)   Exercise Vital Sign    Days of Exercise per  Week: 3 days    Minutes of Exercise per Session: 10 min  Stress: Stress Concern Present (04/29/2023)   [employer] of Occupational Health - Occupational Stress Questionnaire    Feeling of Stress : To some extent  Social Connections: Moderately Integrated (06/05/2023)   Social Connection and Isolation Panel [NHANES]    Frequency of Communication with Friends and Family: More than three times a week    Frequency of Social Gatherings with Friends and Family: More than three times a week    Attends Religious Services: More than 4 times per year    Active Member of [employer] or Organizations: No    Attends Banker Meetings: Never    Marital Status: Living with partner    Allergies:  Allergies  Allergen Reactions   Penicillin G Itching, Rash, Anaphylaxis and Palpitations   Lamictal [Lamotrigine] Rash   Tape    Carbamazepine Other (See Comments)    Medication interaction-prograf    Hydrocodone-Acetaminophen Itching   Naproxen Itching   Penicillins    Vancomycin Rash    Macular/blotchy rash at infusion site    Metabolic Disorder Labs: Lab Results  Component Value Date   HGBA1C 10.8 (H) 10/16/2023   MPG 263.26 10/16/2023   MPG 206 03/07/2023   Lab Results  Component Value Date   PROLACTIN 16.4 12/12/2022   PROLACTIN 13.4 07/15/2019   Lab Results  Component Value Date   CHOL 66 03/08/2023   TRIG 87 03/08/2023   HDL 30  (L) 03/08/2023   CHOLHDL 2.2 03/08/2023   VLDL 17 03/08/2023   LDLCALC 19 03/08/2023   LDLCALC 19 05/27/2022   Lab Results  Component Value Date   TSH 4.878 (H) 03/09/2023   TSH 2.650 07/15/2019    Therapeutic Level Labs: No results found for: "LITHIUM" No results found for: "VALPROATE" No results found for: "CBMZ"  Current Medications: Current Outpatient Medications  Medication Sig Dispense Refill   Accu-Chek Softclix Lancets lancets 2 (two) times daily.     albuterol (VENTOLIN HFA) 108 (90 Base) MCG/ACT inhaler Inhale 2 puffs into the lungs every 6 (six) hours as needed for wheezing or shortness of breath. 8 g 0   baclofen (LIORESAL) 10 MG tablet Take 1 tablet by mouth 2 (two) times daily.     benzonatate (TESSALON) 100 MG capsule Take 2 capsules (200 mg total) by mouth 2 (two) times daily as needed for cough. 20 capsule 0   benztropine (COGENTIN) 1 MG tablet TAKE 1 TABLET BY MOUTH ONCE DAILY AS NEEDED FOR TREMORS 30 tablet 10   Cholecalciferol (VITAMIN D) 125 MCG (5000 UT) CAPS Take 5,000 Units by mouth daily.     Cranberry-Vitamin C-Probiotic (AZO CRANBERRY PO) Take by mouth.     ezetimibe (ZETIA) 10 MG tablet Take 10 mg by mouth every morning.     fluticasone (FLONASE) 50 MCG/ACT nasal spray Place into the nose.     ibuprofen (ADVIL) 400 MG tablet Take 1 tablet (400 mg total) by mouth every 6 (six) hours as needed. 30 tablet 0   Insulin Glargine (BASAGLAR KWIKPEN) 100 UNIT/ML Inject 40 Units into the skin at bedtime.     Insulin Pen Needle 32G X 6 MM MISC 1 each by Does not apply route as needed. 100 each 5   lisinopril (ZESTRIL) 20 MG tablet TAKE 1 TABLET BY MOUTH ONCE DAILY 30 tablet 3   lurasidone (LATUDA) 20 MG TABS tablet TAKE 1 TABLET BY MOUTH ONCE DAILY WITH SUPPER *REFILL REQUEST* 90  tablet 10   magnesium oxide (MAG-OX) 400 MG tablet Take by mouth.     Melatonin 10 MG TABS Take 10 mg by mouth at bedtime.     metFORMIN (GLUCOPHAGE) 1000 MG tablet Take 1 tablet (1,000  mg total) by mouth 2 (two) times daily with a meal. 180 tablet 3   mirabegron ER (MYRBETRIQ) 50 MG TB24 tablet Take 1 tablet (50 mg total) by mouth daily. 90 tablet 3   mirtazapine (REMERON) 15 MG tablet TAKE 1 TABLET BY MOUTH EVERY DAY AT BEDTIME *REFILL REQUEST* 90 tablet 10   naloxone (NARCAN) nasal spray 4 mg/0.1 mL Place 0.4 mg into the nose once.     NOVOLOG FLEXPEN 100 UNIT/ML FlexPen Inject 0-30 Units into the skin 3 (three) times daily with meals. Sliding scale     ondansetron (ZOFRAN) 4 MG tablet Take 1 tablet (4 mg total) by mouth every 6 (six) hours as needed for nausea. 20 tablet 0   oxyCODONE (OXY IR/ROXICODONE) 5 MG immediate release tablet Take 5 mg by mouth 3 (three) times daily as needed.     PRECISION QID TEST test strip      pregabalin (LYRICA) 200 MG capsule Take 1 capsule (200 mg total) by mouth 2 (two) times daily. 60 capsule 0   rosuvastatin (CRESTOR) 10 MG tablet Take 1 tablet (10 mg total) by mouth daily. 90 tablet 2   tacrolimus (PROGRAF) 1 MG capsule Take 1 capsule by mouth every morning.     UBRELVY 50 MG TABS Take 1 tablet by mouth daily as needed.     venlafaxine XR (EFFEXOR-XR) 75 MG 24 hr capsule Take 1 capsule (75 mg total) by mouth daily with breakfast. Take along with 37.5 mg daily 30 capsule 10   Xylitol (XYLIMELTS) 550 MG DISK Take by mouth.     No current facility-administered medications for this visit.     Musculoskeletal: Strength & Muscle Tone: within normal limits Gait & Station: normal Patient leans: N/A  Psychiatric Specialty Exam: Review of Systems  Psychiatric/Behavioral:  Positive for sleep disturbance.     Blood pressure 118/82, pulse 88, temperature 98.6 F (37 C), temperature source Temporal, height 5\' 6"  (1.676 m), weight 246 lb 3.2 oz (111.7 kg), last menstrual period 10/16/2021, SpO2 98%.Body mass index is 39.74 kg/m.  General Appearance: Fairly Groomed  Eye Contact:  Fair  Speech:  Clear and Coherent  Volume:  Normal  Mood:   Euthymic  Affect:  Congruent  Thought Process:  Goal Directed and Descriptions of Associations: Intact  Orientation:  Full (Time, Place, and Person)  Thought Content: Logical   Suicidal Thoughts:  No  Homicidal Thoughts:  No  Memory:  Immediate;   Fair Recent;   Fair Remote;   Fair  Judgement:  Fair  Insight:  Fair  Psychomotor Activity:  Normal  Concentration:  Concentration: Fair and Attention Span: Fair  Recall:  [employer] of Knowledge: Fair  Language: Fair  Akathisia:  No  Handed:  Right  AIMS (if indicated): done  Assets:  Desire for Improvement Housing Social Support  ADL's:  Intact  Cognition: WNL  Sleep:  Poor   Screenings: Geneticist, molecular Office Visit from 11/04/2023 in [facility] Office Visit from 07/21/2023 in [facility] Office Visit from 12/12/2022 in [facility] Office Visit from 09/12/2022 in [facility] Office Visit from 07/08/2022 in [facility]  AIMS Total Score 0 0 0 0 0      GAD-7    Flowsheet Row Office Visit from 07/21/2023 in [facility] Office Visit from 03/19/2023 in [facility] Video Visit from 03/17/2023 in [facility] Office Visit from 01/22/2023 in [facility] Office Visit from 12/12/2022 in [facility]  Total GAD-7 Score 8 2 14 8 7       PHQ2-9    Flowsheet Row Office Visit from 11/04/2023 in [facility] Office Visit from 09/08/2023 in [facility] Office Visit from 07/21/2023 in [facility]  [facility] from 03/17/2023 in [facility]  PHQ-2 Total Score 1 0 2 2 0  PHQ-9 Total Score -- -- 10 6 --      Flowsheet Row Office Visit from 11/04/2023 in [facility] ED from 10/15/2023 in [facility] at [facility] ED from 09/12/2023 in [facility]   C-SSRS RISK CATEGORY No Risk No Risk No Risk        Assessment and Plan: Alison [name] is a 48 year old Caucasian female on disability, has a history of bipolar disorder, PTSD, multiple medical problems was evaluated in office today.  Patient with recent EKG changes likely multifactorial as well as sleep issues likely due to CPAP mask problem, discussed assessment and plan as noted below.  Bipolar Disorder-in remission Bipolar disorder managed with Latuda 20 mg. Reports well-controlled mood with no recent depressive episodes or suicidal ideation. Regular therapy sessions with Alison [name] every 3-4 weeks are beneficial. - Continue Latuda 20 mg daily with supper - Continue Cogentin 1 mg daily as needed for [name] effects of Latuda. - Continue regular therapy sessions with Alison [name]  Post-Traumatic Stress Disorder (PTSD)-stable Generalized anxiety Disorder-stable PTSD and anxiety managed with venlafaxine 112.5 mg daily and mirtazapine 15 mg at bedtime. Symptoms are well-managed, and therapy sessions are helpful. - Continue venlafaxine 112.5 mg daily - Continue mirtazapine 15 mg at bedtime - Continue regular therapy sessions with Alison [name]  Prolonged QTc Interval per history QTc interval prolonged at 504 ms, concerning given current medications (Latuda, venlafaxine, mirtazapine, oxycodone). Discussed need for close monitoring and potential medication adjustments. - Send message to cardiologist Dr. Prudy Feeler - Order repeat EKG - Monitor QTc interval closely - Consider medication adjustments if QTc remains  prolonged  Insomnia/sleep Apnea-unstable Chronic sleep apnea with non-compliance to CPAP therapy due to mask discomfort, contributing to sleep disturbances. Emphasized importance of CPAP compliance to prevent complications such as stroke, heart attack, memory issues, and dementia. Discussed risks of sedative medications exacerbating apnea and leading to respiratory arrest. - Contact CPAP provider to address mask discomfort - Reinforce the importance of nightly CPAP use - Melatonin 5 to 10 mg at bedtime. - Continue mirtazapine as prescribed.  Follow-up - Follow-up in clinic in 3 to 4 weeks or sooner if needed. - Follow up with cardiologist - Repeat EKG as ordered   Collaboration of Care: Collaboration of Care: Referral or follow-up with counselor/therapist AEB patient encouraged to continue CBT.  I have sent a message to Dr.Agbor Etang-cardiology regarding recent EKG changes.  Patient/Guardian was advised Release of Information must be obtained prior to any record release in order to collaborate their  care with an outside provider. Patient/Guardian was advised if they have not already done so to contact the registration department to sign all necessary forms in order for [place] to release information regarding their care.   Consent: Patient/Guardian gives verbal consent for treatment and assignment of benefits for services provided during this visit. Patient/Guardian expressed understanding and agreed to proceed.   This note was generated in part or whole with voice recognition software. Voice recognition is usually quite accurate but there are transcription errors that can and very often do occur. I apologize for any typographical errors that were not detected and corrected.    [name], MD 11/04/2023, 1:59 PM

## 2023-11-05 DIAGNOSIS — G894 Chronic pain syndrome: Secondary | ICD-10-CM | POA: Diagnosis not present

## 2023-11-05 DIAGNOSIS — M25519 Pain in unspecified shoulder: Secondary | ICD-10-CM | POA: Diagnosis not present

## 2023-11-05 DIAGNOSIS — M79603 Pain in arm, unspecified: Secondary | ICD-10-CM | POA: Diagnosis not present

## 2023-11-05 DIAGNOSIS — F3189 Other bipolar disorder: Secondary | ICD-10-CM | POA: Diagnosis not present

## 2023-11-05 DIAGNOSIS — Z79891 Long term (current) use of opiate analgesic: Secondary | ICD-10-CM | POA: Diagnosis not present

## 2023-11-05 DIAGNOSIS — M5414 Radiculopathy, thoracic region: Secondary | ICD-10-CM | POA: Diagnosis not present

## 2023-11-05 DIAGNOSIS — M79669 Pain in unspecified lower leg: Secondary | ICD-10-CM | POA: Diagnosis not present

## 2023-11-05 DIAGNOSIS — M5442 Lumbago with sciatica, left side: Secondary | ICD-10-CM | POA: Diagnosis not present

## 2023-11-05 DIAGNOSIS — M79606 Pain in leg, unspecified: Secondary | ICD-10-CM | POA: Diagnosis not present

## 2023-11-05 DIAGNOSIS — I10 Essential (primary) hypertension: Secondary | ICD-10-CM | POA: Diagnosis not present

## 2023-11-05 DIAGNOSIS — G893 Neoplasm related pain (acute) (chronic): Secondary | ICD-10-CM | POA: Diagnosis not present

## 2023-11-06 ENCOUNTER — Other Ambulatory Visit: Payer: Self-pay

## 2023-11-06 DIAGNOSIS — E1165 Type 2 diabetes mellitus with hyperglycemia: Secondary | ICD-10-CM

## 2023-11-07 ENCOUNTER — Telehealth: Payer: Self-pay | Admitting: Psychiatry

## 2023-11-07 ENCOUNTER — Other Ambulatory Visit: Payer: Self-pay | Admitting: Obstetrics and Gynecology

## 2023-11-07 NOTE — Patient Instructions (Signed)
Visit Information  Alison [name] was given information about Medicaid Managed Care team care coordination services as a part of their Healthy [facility] Medicaid benefit. Alison [name] verbally consented to engagement with the [facility] Managed Care team.   If you are experiencing a medical emergency, please call 911 or report to your local emergency department or urgent care.   If you have a non-emergency medical problem during routine business hours, please contact your provider's office and ask to speak with a nurse.   For questions related to your Healthy [facility] health plan, please call: [phone] or visit the homepage here: [email or web address]  If you would like to schedule transportation through your Healthy [facility] plan, please call the following number at least 2 days in advance of your appointment: [phone]  For information about your ride after you set it up, call Ride Assist at [phone]. Use this number to activate a Will Call pickup, or if your transportation is late for a scheduled pickup. Use this number, too, if you need to make a change or cancel a previously scheduled reservation.  If you need transportation services right away, call [phone]. The after-hours call center is staffed 24 hours to handle ride assistance and urgent reservation requests (including discharges) 365 days a year. Urgent trips include sick visits, hospital discharge requests and life-sustaining treatment.  Call the [facility] Line at [phone], at any time, 24 hours a day, 7 days a week. If you are in danger or need immediate medical attention call 911.  If you would like help to quit smoking, call 1-800-QUIT-NOW ((831)557-7976) OR Espaol: 1-855-Djelo-Ya (5-638-756-4332) o para ms informacin haga clic aqu or Text READY to 951-884 to register via text  Alison [name] - following are the goals we discussed in your visit today:    Goals Addressed             This Visit's Progress    Chronic Pain Managed       11/07/23:  patient states hip pain is better.   Patient verbalizes understanding of instructions and care plan provided today and agrees to view in MyChart. Active MyChart status and patient understanding of how to access instructions and care plan via MyChart confirmed with patient.     The Managed Medicaid care management team will reach out to the patient again over the next 30 business  days.  The  Patient  has been provided with contact information for the Managed Medicaid care management team and has been advised to call with any health related questions or concerns.   Alison Der RN, [name], [employer] [facility] RN Care Manager Direct Dial [phone]/FUX [phone] Website: [name]   Following is a copy of your plan of care:  Care Plan : Chronic Pain (Adult)  Updates made by Alison Chandler, RN since 11/07/2023 12:00 AM     Problem: Chronic Pain Management-fibromyalgia   Priority: High  Onset Date: 01/22/2021     Long-Range Goal: Fibromyalgia pain managed-new pain management provider   Start Date: 08/14/2020  Expected End Date: 02/05/2024  Recent Progress: On track  Priority: High  Note:   Current Barriers:  Knowledge deficits related to pain management  11/07/23:  Patient states left hip pain is better.  Hospitalized 1/8-1/9 for flu/sepsis.Blood sugars 300s-ENDO appt 2/10.    Nurse Case Manager Clinical Goal(s):  Over the next 45 days, patient will work with [facility] to address needs related to referral for pain management provider and associated  care coordination needs. 05/24/22:  patient is currently seeing Dr. Welton Flakes at [facility].  Interventions:  Inter-disciplinary care team collaboration (see longitudinal plan of care) Evaluation of current treatment plan related to fibromyalgia  and patient's adherence to plan as established by  provider. Update 06/15/21:  Patient taking Baclofen again, Effexor added. Collaborated with primary care provider regarding recommendations and referral to pain management provider. Discussed plans with patient for ongoing care management follow up and provided patient with direct contact information for care management team Anticipate pain education program, pain management support as part of pain management referral.       Patient Goals/Self-Care Activities Over the next 45 days, patient will:  -Attends all scheduled provider appointments  develop a personal pain management plan with your pain management provider. Patient will f/u with Dr. Welton Flakes and insurance regarding auth. 10/07/23:  To schedule ORTHO and DERM appt.  Follow Up Plan:  The Managed Medicaid care management team will reach out to the patient again over the next 30 business days

## 2023-11-07 NOTE — Telephone Encounter (Signed)
Communication was sent to cardiology regarding patient's recent EKG changes. Received message back from cardiology Dr.Agbor Sandie Ano ' That it is okay to continue current medication regimen for this patient.  QT appears prolonged due to patient having a chronic RBBB .  Corrected QT is 427 which is normal."

## 2023-11-07 NOTE — Patient Outreach (Signed)
Medicaid Managed Care   Nurse Care Manager Note  11/07/2023 Name:  Alison [name] MRN:  [id number] DOB:  [birth year]  Alison [name] is an 48 y.o. year old female who is a primary patient of [name], FNP.  [facility] Managed Care Coordination team was consulted for assistance with:    Chronic healthcare management needs, HTN, DM, chronic pain, anxiety/depression/BP/PTSD/OSD, migraines, DLBCL, HLD, fibromyalgia, osteoarthritis, liver transplant.  Ms. Mcculley was given information about Medicaid Managed Care Coordination team services today. Alison [name] Patient agreed to services and verbal consent obtained.  Engaged with patient by telephone for follow up visit in response to provider referral for case management and/or care coordination services.   Patient is participating in a Managed Medicaid Plan:  Yes  Assessments/Interventions:  Review of past medical history, allergies, medications, health status, including review of consultants reports, laboratory and other test data, was performed as part of comprehensive evaluation and provision of chronic care management services.  SDOH (Social Drivers of Health) assessments and interventions performed: SDOH Interventions    Flowsheet Row Patient Outreach Telephone from 11/07/2023 in [place] POPULATION HEALTH DEPARTMENT Patient Outreach Telephone from 10/07/2023 in [place] POPULATION HEALTH DEPARTMENT Patient Outreach Telephone from 09/05/2023 in [place] POPULATION HEALTH DEPARTMENT Patient Outreach Telephone from 08/05/2023 in [place] POPULATION HEALTH DEPARTMENT Patient Outreach Telephone from 06/05/2023 in [place] POPULATION HEALTH DEPARTMENT Patient Outreach Telephone from 04/29/2023 in [place] POPULATION HEALTH DEPARTMENT  SDOH Interventions        Food Insecurity Interventions -- -- -- Intervention Not Indicated -- --  Housing Interventions -- -- -- Intervention Not Indicated -- --  Transportation  Interventions -- -- Intervention Not Indicated -- -- --  Utilities Interventions -- -- -- -- Intervention Not Indicated --  Alcohol Usage Interventions -- Intervention Not Indicated (Score <7) -- -- -- --  Financial Strain Interventions -- Intervention Not Indicated -- -- -- --  Physical Activity Interventions Other (Comments)  [can't do that type of exercise] -- -- -- -- --  Stress Interventions Other (Comment)  [patient sees therapist/Psychiatrist] -- -- -- -- Provide Counseling  [followed by Psychiatry]  Social Connections Interventions -- -- -- -- Intervention Not Indicated --  Health Literacy Interventions -- -- -- -- -- Intervention Not Indicated     Care Plan Allergies  Allergen Reactions   Penicillin G Itching, Rash, Anaphylaxis and Palpitations   Lamictal [Lamotrigine] Rash   Tape    Carbamazepine Other (See Comments)    Medication interaction-prograf    Hydrocodone-Acetaminophen Itching   Naproxen Itching   Penicillins    Vancomycin Rash    Macular/blotchy rash at infusion site   Medications Reviewed Today     Reviewed by [name], RN (Registered Nurse) on 11/07/23 at 1445  Med List Status: <None>   Medication Order Taking? Sig Documenting Provider Last Dose Status Informant  Accu-Chek Softclix Lancets lancets [id number] No 2 (two) times daily. [provider] Taking Active Self  albuterol (VENTOLIN HFA) 108 (90 Base) MCG/ACT inhaler [id number] No Inhale 2 puffs into the lungs every 6 (six) hours as needed for wheezing or shortness of breath. [name], FNP Taking Active Self  baclofen (LIORESAL) 10 MG tablet [id number] No Take 1 tablet by mouth 2 (two) times daily. [provider] Taking Active Self  benzonatate (TESSALON) 100 MG capsule [id number] No Take 2 capsules (200 mg total) by mouth 2 (two) times daily as needed for cough. [name], FNP  Taking Active Self  benztropine (COGENTIN) 1 MG tablet [id number] No TAKE 1 TABLET BY MOUTH ONCE  DAILY AS NEEDED FOR TREMORS [name], [name], MD Taking Active Self  Cholecalciferol (VITAMIN D) 125 MCG (5000 UT) CAPS [id number] No Take 5,000 Units by mouth daily. [provider] Taking Active Self  Cranberry-Vitamin C-Probiotic (AZO CRANBERRY PO) [id number] No Take by mouth. [provider] Taking Active Self  ezetimibe (ZETIA) 10 MG tablet [id number] No Take 10 mg by mouth every morning. [provider] Taking Active Self  fluticasone (FLONASE) 50 MCG/ACT nasal spray [id number] No Place into the nose. [provider] Taking Active Self  ibuprofen (ADVIL) 400 MG tablet [id number] No Take 1 tablet (400 mg total) by mouth every 6 (six) hours as needed. Marcelino Duster, MD Taking Active Self  Insulin Glargine [facility] KWIKPEN) 100 UNIT/ML [id number] No Inject 40 Units into the skin at bedtime. [provider] Taking Active Self  Insulin Pen Needle 32G X 6 MM MISC [id number] No 1 each by Does not apply route as needed. [name], FNP Taking Active Self  lisinopril (ZESTRIL) 20 MG tablet [id number] No TAKE 1 TABLET BY MOUTH ONCE DAILY [name], FNP Taking Active Self  lurasidone (LATUDA) 20 MG TABS tablet [id number] No TAKE 1 TABLET BY MOUTH ONCE DAILY WITH SUPPER *REFILL REQUESTJomarie Longs, MD Taking Active Self  magnesium oxide (MAG-OX) 400 MG tablet [id number] No Take by mouth. [provider] Taking Active Self  Melatonin 10 MG TABS [id number] No Take 10 mg by mouth at bedtime. [provider] Taking Active Self  metFORMIN (GLUCOPHAGE) 1000 MG tablet [id number] No Take 1 tablet (1,000 mg total) by mouth 2 (two) times daily with a meal. [name], FNP Taking Active Self  mirabegron ER (MYRBETRIQ) 50 MG TB24 tablet [id number] No Take 1 tablet (50 mg total) by mouth daily. [name], FNP Taking Active Self  mirtazapine (REMERON) 15 MG tablet [id number] No TAKE 1 TABLET BY MOUTH EVERY DAY AT BEDTIME *REFILL REQUESTJomarie Longs, MD Taking Active Self  naloxone [facility]) nasal spray 4 mg/0.1 mL [id number] No Place 0.4 mg into the nose once. [provider] Taking Active Self           Med Note Aundria Rud, Tresa Endo S   Wed Oct 15, 2023  1:51 PM) prn  NOVOLOG FLEXPEN 100 UNIT/ML FlexPen [id number] No Inject 0-30 Units into the skin 3 (three) times daily with meals. Sliding scale [provider] Taking Active Self  ondansetron (ZOFRAN) 4 MG tablet [id number] No Take 1 tablet (4 mg total) by mouth every 6 (six) hours as needed for nausea. Arnetha Courser, MD Taking Active   oxyCODONE (OXY IR/ROXICODONE) 5 MG immediate release tablet [id number] No Take 5 mg by mouth 3 (three) times daily as needed. [provider] Taking Active Self  PRECISION QID TEST test strip [id number] No  [provider] Taking Active Self  pregabalin (LYRICA) 200 MG capsule [id number] No Take 1 capsule (200 mg total) by mouth 2 (two) times daily. Phineas Semen, MD Taking Active Self  rosuvastatin (CRESTOR) 10 MG tablet [id number] No Take 1 tablet (10 mg total) by mouth daily. Debbe Odea, MD Taking Active Self  tacrolimus (PROGRAF) 1 MG capsule [id number] No Take 1 capsule by mouth every morning. [provider] Taking Active Self  UBRELVY 50 MG TABS [id number] No Take 1 tablet by mouth daily as needed. [provider] Taking Active Self  venlafaxine XR (EFFEXOR-XR) 75  MG 24 hr capsule [id number] No Take 1 capsule (75 mg total) by mouth daily with breakfast. Take along with 37.5 mg daily [name], Levin Bacon, MD Taking Active Self  Xylitol (XYLIMELTS) 550 MG DISK [id number] No Take by mouth. [provider] Taking Active Self           Patient Active Problem List   Diagnosis Date Noted   Weakness 10/16/2023   Flu 10/16/2023   SIRS (systemic inflammatory response syndrome) (HCC) 10/15/2023   Elevated serum creatinine 10/15/2023   Myalgia 10/15/2023   Elevated LFTs 10/15/2023    Acute idiopathic pericarditis 03/09/2023   Acute pericarditis 03/08/2023   Sepsis (HCC) 03/06/2023   Chest pain 03/06/2023   CKD (chronic kidney disease), stage II 01/22/2023   Insulin dependent type 2 diabetes mellitus (HCC) 01/22/2023   Overactive bladder 01/22/2023   High risk medication use 12/12/2022   Sjogren's syndrome (HCC) 05/27/2022   Menorrhagia with irregular cycle    Akathisia 10/24/2021   At risk for prolonged QT interval syndrome 09/12/2021   Prolonged Q-T interval on ECG 08/29/2021   Insomnia 07/20/2021   Bipolar disorder, in full remission, most recent episode mixed (HCC) 07/20/2021   GAD (generalized anxiety disorder) 05/09/2021   Opioid dependence with opioid-induced disorder (HCC) 12/20/2020   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   Carpal tunnel syndrome, left 07/26/2019   Cubital tunnel syndrome on left 07/26/2019   Umbilical hernia without obstruction and without gangrene 06/17/2019   Lumbar spondylosis 06/02/2019   Obesity (BMI 35.0-39.9 without comorbidity) 06/02/2019   Goals of care, counseling/discussion 05/18/2019   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   Fibromyalgia 03/23/2019   Systemic lupus erythematosus (SLE) in adult [facility]) 03/23/2019   Essential hypertension 03/23/2019   Mass of left kidney 03/23/2019   Nausea without vomiting 03/23/2019   Hyperlipidemia 03/23/2019   Osteoarthritis 03/23/2019   Trigeminal neuralgia 03/23/2019   Nephrolithiasis 03/23/2019   Migraine 03/23/2019   OSA on CPAP 03/23/2019   Fibrocystic breast 03/23/2019   Heart murmur 03/23/2019   Major depressive disorder, recurrent (HCC) 03/18/2017   Morbid obesity (HCC) 03/18/2017   Neuropathic pain 11/19/2016   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HCC) 01/13/2015   Lumbar disc disease with radiculopathy 07/26/2013   S/P liver transplant (HCC) 07/26/2013   Facial nerve disorder 06/29/2012   Low back pain 12/26/2010   Conditions to be  addressed/monitored per PCP order:  Chronic healthcare management needs, HTN, DM, chronic pain, anxiety/depression/BP/PTSD/OSD, migraines, DLBCL, HLD, fibromyalgia, osteoarthritis, liver transplant.  Care Plan : Chronic Pain (Adult)  Updates made by [name], RN since 11/07/2023 12:00 AM     Problem: Chronic Pain Management-fibromyalgia   Priority: High  Onset Date: 01/22/2021     Long-Range Goal: Fibromyalgia pain managed-new pain management provider   Start Date: 08/14/2020  Expected End Date: 02/05/2024  Recent Progress: On track  Priority: High  Note:   Current Barriers:  Knowledge deficits related to pain management  11/07/23:  Patient states left hip pain is better.  Hospitalized 1/8-1/9 for flu/sepsis.Blood sugars 300s-ENDO appt 2/10.    Nurse Case Manager Clinical Goal(s):  Over the next 45 days, patient will work with [facility] to address needs related to referral for pain management provider and associated care coordination needs. 05/24/22:  patient is currently seeing Dr. Welton Flakes at [facility].  Interventions:  Inter-disciplinary care team collaboration (see longitudinal plan of care) Evaluation of current treatment plan related to fibromyalgia  and patient's adherence to  plan as established by provider. Update 06/15/21:  Patient taking Baclofen again, Effexor added. Collaborated with primary care provider regarding recommendations and referral to pain management provider. Discussed plans with patient for ongoing care management follow up and provided patient with direct contact information for care management team Anticipate pain education program, pain management support as part of pain management referral.       Patient Goals/Self-Care Activities Over the next 45 days, patient will:  -Attends all scheduled provider appointments  develop a personal pain management plan with your pain management provider. Patient will f/u with Dr. Welton Flakes and insurance  regarding auth. 10/07/23:  To schedule ORTHO and DERM appt.  Follow Up Plan:  The Managed Medicaid care management team will reach out to the patient again over the next 30 business days.    Follow Up:  Patient agrees to Care Plan and Follow-up.  Plan: The Managed Medicaid care management team will reach out to the patient again over the next 30 business  days. and The  Patient has been provided with contact information for the Managed Medicaid care management team and has been advised to call with any health related questions or concerns.  Date/time of next scheduled RN care management/care coordination outreach: 12/08/23 at 315.

## 2023-11-13 ENCOUNTER — Other Ambulatory Visit: Payer: Medicaid Other

## 2023-11-13 DIAGNOSIS — F4310 Post-traumatic stress disorder, unspecified: Secondary | ICD-10-CM | POA: Diagnosis not present

## 2023-11-13 DIAGNOSIS — E1165 Type 2 diabetes mellitus with hyperglycemia: Secondary | ICD-10-CM | POA: Diagnosis not present

## 2023-11-13 DIAGNOSIS — F331 Major depressive disorder, recurrent, moderate: Secondary | ICD-10-CM | POA: Diagnosis not present

## 2023-11-13 DIAGNOSIS — F411 Generalized anxiety disorder: Secondary | ICD-10-CM | POA: Diagnosis not present

## 2023-11-14 LAB — COMPREHENSIVE METABOLIC PANEL
AG Ratio: 1.4 (calc) (ref 1.0–2.5)
ALT: 22 U/L (ref 6–29)
AST: 26 U/L (ref 10–35)
Albumin: 4.4 g/dL (ref 3.6–5.1)
Alkaline phosphatase (APISO): 89 U/L (ref 37–153)
BUN: 17 mg/dL (ref 7–25)
CO2: 22 mmol/L (ref 20–32)
Calcium: 10 mg/dL (ref 8.6–10.4)
Chloride: 102 mmol/L (ref 98–110)
Creat: 1.01 mg/dL (ref 0.50–1.03)
Globulin: 3.1 g/dL (ref 1.9–3.7)
Glucose, Bld: 210 mg/dL — ABNORMAL HIGH (ref 65–99)
Potassium: 4.7 mmol/L (ref 3.5–5.3)
Sodium: 137 mmol/L (ref 135–146)
Total Bilirubin: 0.6 mg/dL (ref 0.2–1.2)
Total Protein: 7.5 g/dL (ref 6.1–8.1)

## 2023-11-14 LAB — HEMOGLOBIN A1C
Hgb A1c MFr Bld: 11.8 %{Hb} — ABNORMAL HIGH (ref <5.7)
Mean Plasma Glucose: 292 mg/dL
eAG (mmol/L): 16.2 mmol/L

## 2023-11-14 LAB — LIPID PANEL
Cholesterol: 119 mg/dL (ref <200)
HDL: 37 mg/dL — ABNORMAL LOW (ref >=50)
LDL Cholesterol (Calc): 50 mg/dL
Non-HDL Cholesterol (Calc): 82 mg/dL (ref <130)
Total CHOL/HDL Ratio: 3.2 (calc) (ref <5.0)
Triglycerides: 303 mg/dL — ABNORMAL HIGH (ref <150)

## 2023-11-14 LAB — C-PEPTIDE: C-Peptide: 5.98 ng/mL — ABNORMAL HIGH (ref 0.80–3.85)

## 2023-11-14 LAB — MICROALBUMIN / CREATININE URINE RATIO
Creatinine, Urine: 75 mg/dL (ref 20–275)
Microalb Creat Ratio: 553 mg/g{creat} — ABNORMAL HIGH (ref <30)
Microalb, Ur: 41.5 mg/dL

## 2023-11-17 ENCOUNTER — Ambulatory Visit: Payer: Medicaid Other | Admitting: "Endocrinology

## 2023-11-17 ENCOUNTER — Encounter: Payer: Self-pay | Admitting: "Endocrinology

## 2023-11-17 VITALS — BP 112/84 | HR 76 | Resp 16 | Ht 66.0 in | Wt 253.0 lb

## 2023-11-17 DIAGNOSIS — Z7984 Long term (current) use of oral hypoglycemic drugs: Secondary | ICD-10-CM | POA: Diagnosis not present

## 2023-11-17 DIAGNOSIS — Z794 Long term (current) use of insulin: Secondary | ICD-10-CM

## 2023-11-17 DIAGNOSIS — E782 Mixed hyperlipidemia: Secondary | ICD-10-CM | POA: Diagnosis not present

## 2023-11-17 DIAGNOSIS — E1165 Type 2 diabetes mellitus with hyperglycemia: Secondary | ICD-10-CM | POA: Diagnosis not present

## 2023-11-17 MED ORDER — SEMAGLUTIDE(0.25 OR 0.5MG/DOS) 2 MG/3ML ~~LOC~~ SOPN: 0.2500 mg | PEN_INJECTOR | SUBCUTANEOUS | 0 refills | Status: DC

## 2023-11-17 MED ORDER — GLIPIZIDE ER 10 MG PO TB24: 10.0000 mg | ORAL_TABLET | Freq: Two times a day (BID) | ORAL | 5 refills | Status: DC

## 2023-11-17 MED ORDER — DEXCOM G7 SENSOR MISC: 1.0000 | 3 refills | Status: DC

## 2023-11-17 NOTE — Patient Instructions (Signed)

## 2023-11-17 NOTE — Progress Notes (Signed)
 Outpatient Endocrinology Note Alison Newcomer, MD  11/17/23   Alison [name] [birth year] [id number]  Referring Provider: Quinton Buckler, FNP Primary Care Provider: Quinton Buckler, FNP Reason for consultation: Subjective   Assessment & Plan  Diagnoses and all orders for this visit:  Uncontrolled type 2 diabetes mellitus with hyperglycemia (HCC) -     Ambulatory referral to diabetic education  Long-term insulin  use (HCC)  Long term (current) use of oral hypoglycemic drugs  Mixed hypercholesterolemia and hypertriglyceridemia  Other orders -     Semaglutide ,0.25 or 0.5MG /DOS, 2 MG/3ML SOPN; Inject 0.25 mg into the skin once a week. -     glipiZIDE  (GLUCOTROL  XL) 10 MG 24 hr tablet; Take 1 tablet (10 mg total) by mouth in the morning and at bedtime. -     Continuous Glucose Sensor (DEXCOM G7 SENSOR) MISC; 1 Device by Does not apply route continuous.   Diabetes Type II complicated by neuropathy, No results found for: "GFR" Hba1c goal less than 7, current Hba1c is  Lab Results  Component Value Date   HGBA1C 11.8 (H) 11/13/2023   Will recommend the following: Reinforced compliance  Continue  Tresiba 30 units at bedtime Continue Novolog  Sliding scale 2:50>150 Continue Metformin  1000 mg bid Start Glipizide  XL 10 mg BID Ordered DM education Ordered DexCom Discussed that its unsafe not to check BG before meals, patient understands and greed  Ran out of Trulicity  for 2 mo   No known contraindications/side effects to any of above medications Glucagon discussed and prescribed with refills on 11/2023  -Last LD and Tg are as follows: Lab Results  Component Value Date   LDLCALC 50 11/13/2023    Lab Results  Component Value Date   TRIG 303 (H) 11/13/2023   -On rosuvastatin  10 mg QD -Follow low fat diet and exercise   -Blood pressure goal <140/90 - Microalbumin/creatinine goal is < 30 -Last MA/Cr is as follows: Lab Results  Component Value Date   MICROALBUR 41.5  11/13/2023   -on ACE/ARB lisinopril  20 mg every day  -diet changes including salt restriction -limit eating outside -counseled BP targets per standards of diabetes care -uncontrolled blood pressure can lead to retinopathy, nephropathy and cardiovascular and atherosclerotic heart disease  Reviewed and counseled on: -A1C target -Blood sugar targets -Complications of uncontrolled diabetes  -Checking blood sugar before meals and bedtime and bring log next visit -All medications with mechanism of action and side effects -Hypoglycemia management: rule of 15's, Glucagon Emergency Kit and medical alert ID -low-carb low-fat plate-method diet -At least 20 minutes of physical activity per day -Annual dilated retinal eye exam and foot exam -compliance and follow up needs -follow up as scheduled or earlier if problem gets worse  Call if blood sugar is less than 70 or consistently above 250    Take a 15 gm snack of carbohydrate at bedtime before you go to sleep if your blood sugar is less than 100.    If you are going to fast after midnight for a test or procedure, ask your physician for instructions on how to reduce/decrease your insulin  dose.    Call if blood sugar is less than 70 or consistently above 250  -Treating a low sugar by rule of 15  (15 gms of sugar every 15 min until sugar is more than 70) If you feel your sugar is low, test your sugar to be sure If your sugar is low (less than 70), then take 15 grams of a fast  acting Carbohydrate (3-4 glucose tablets or glucose gel or 4 ounces of juice or regular soda) Recheck your sugar 15 min after treating low to make sure it is more than 70 If sugar is still less than 70, treat again with 15 grams of carbohydrate          Don't drive the hour of hypoglycemia  If unconscious/unable to eat or drink by mouth, use glucagon injection or nasal spray baqsimi and call 911. Can repeat again in 15 min if still unconscious.  Return in about 2 weeks  (around 12/01/2023).   I have reviewed current medications, nurse's notes, allergies, vital signs, past medical and surgical history, family medical history, and social history for this encounter. Counseled patient on symptoms, examination findings, lab findings, imaging results, treatment decisions and monitoring and prognosis. The patient understood the recommendations and agrees with the treatment plan. All questions regarding treatment plan were fully answered.  Alison Newcomer, MD  11/17/23   History of Present Illness Alison [name] is a 48 y.o. year old female who presents for evaluation of Type II diabetes mellitus.  Alison [name] was first diagnosed around 2020.   Diabetes education +  Home diabetes regimen: Tresiba 30 units at bedtime Novolog  Sliding scale 2:50>150 Metformin  1000 mg bid  Ran out of Trulicity  for 2 mo   COMPLICATIONS -  MI/Stroke -  retinopathy +  neuropathy -  nephropathy  SYMPTOMS REVIEWED - Polyuria - Weight loss - Blurred vision  BLOOD SUGAR DATA Rarely checks BG Log 256-353  Physical Exam  BP 112/84   Pulse 76   Resp 16   Ht 5\' 6"  (1.676 m)   Wt 253 lb (114.8 kg)   LMP 10/16/2021 (Exact Date) Comment: Seen by OB GYN on 10/16/21 for 6 days of excessive bleeding as a post menapause  SpO2 98%   BMI 40.84 kg/m    Constitutional: well developed, well nourished Head: normocephalic, atraumatic Eyes: sclera anicteric, no redness Neck: supple Lungs: normal respiratory effort Neurology: alert and oriented Skin: dry, no appreciable rashes Musculoskeletal: no appreciable defects Psychiatric: normal mood and affect Diabetic Foot Exam - Simple   No data filed      Current Medications Patient's Medications  New Prescriptions   CONTINUOUS GLUCOSE SENSOR (DEXCOM G7 SENSOR) MISC    1 Device by Does not apply route continuous.   GLIPIZIDE  (GLUCOTROL  XL) 10 MG 24 HR TABLET    Take 1 tablet (10 mg total) by mouth in the morning and at  bedtime.   SEMAGLUTIDE ,0.25 OR 0.5MG /DOS, 2 MG/3ML SOPN    Inject 0.25 mg into the skin once a week.  Previous Medications   ACCU-CHEK SOFTCLIX LANCETS LANCETS    2 (two) times daily.   ALBUTEROL  (VENTOLIN  HFA) 108 (90 BASE) MCG/ACT INHALER    Inhale 2 puffs into the lungs every 6 (six) hours as needed for wheezing or shortness of breath.   BACLOFEN  (LIORESAL ) 10 MG TABLET    Take 1 tablet by mouth 2 (two) times daily.   BENZONATATE  (TESSALON ) 100 MG CAPSULE    Take 2 capsules (200 mg total) by mouth 2 (two) times daily as needed for cough.   BENZTROPINE  (COGENTIN ) 1 MG TABLET    TAKE 1 TABLET BY MOUTH ONCE DAILY AS NEEDED FOR TREMORS   CHOLECALCIFEROL  (VITAMIN D ) 125 MCG (5000 UT) CAPS    Take 5,000 Units by mouth daily.   CRANBERRY-VITAMIN C-PROBIOTIC (AZO CRANBERRY PO)    Take by mouth.  EZETIMIBE  (ZETIA ) 10 MG TABLET    Take 10 mg by mouth every morning.   FLUTICASONE  (FLONASE ) 50 MCG/ACT NASAL SPRAY    Place into the nose.   IBUPROFEN  (ADVIL ) 400 MG TABLET    Take 1 tablet (400 mg total) by mouth every 6 (six) hours as needed.   INSULIN  GLARGINE (BASAGLAR  KWIKPEN) 100 UNIT/ML    Inject 40 Units into the skin at bedtime.   INSULIN  PEN NEEDLE 32G X 6 MM MISC    1 each by Does not apply route as needed.   LISINOPRIL  (ZESTRIL ) 20 MG TABLET    TAKE 1 TABLET BY MOUTH ONCE DAILY   LURASIDONE  (LATUDA ) 20 MG TABS TABLET    TAKE 1 TABLET BY MOUTH ONCE DAILY WITH SUPPER *REFILL REQUEST*   MAGNESIUM  OXIDE (MAG-OX) 400 MG TABLET    Take 1,000 mg by mouth 2 (two) times daily.   MELATONIN 10 MG TABS    Take 10 mg by mouth at bedtime.   METFORMIN  (GLUCOPHAGE ) 1000 MG TABLET    Take 1 tablet (1,000 mg total) by mouth 2 (two) times daily with a meal.   MIRABEGRON  ER (MYRBETRIQ ) 50 MG TB24 TABLET    Take 1 tablet (50 mg total) by mouth daily.   MIRTAZAPINE  (REMERON ) 15 MG TABLET    TAKE 1 TABLET BY MOUTH EVERY DAY AT BEDTIME *REFILL REQUEST*   NALOXONE (NARCAN) NASAL SPRAY 4 MG/0.1 ML    Place 0.4 mg into  the nose once.   NOVOLOG  FLEXPEN 100 UNIT/ML FLEXPEN    Inject 0-30 Units into the skin 3 (three) times daily with meals. Sliding scale   ONDANSETRON  (ZOFRAN ) 4 MG TABLET    Take 1 tablet (4 mg total) by mouth every 6 (six) hours as needed for nausea.   OXYCODONE  (OXY IR/ROXICODONE ) 5 MG IMMEDIATE RELEASE TABLET    Take 5 mg by mouth 3 (three) times daily as needed.   PRECISION QID TEST TEST STRIP       PREGABALIN  (LYRICA ) 200 MG CAPSULE    Take 1 capsule (200 mg total) by mouth 2 (two) times daily.   ROSUVASTATIN  (CRESTOR ) 10 MG TABLET    Take 1 tablet (10 mg total) by mouth daily.   TACROLIMUS  (PROGRAF ) 1 MG CAPSULE    Take 1 capsule by mouth every morning.   UBRELVY  50 MG TABS    Take 1 tablet by mouth daily as needed.   VENLAFAXINE  XR (EFFEXOR -XR) 75 MG 24 HR CAPSULE    Take 1 capsule (75 mg total) by mouth daily with breakfast. Take along with 37.5 mg daily   XYLITOL (XYLIMELTS) 550 MG DISK    Take by mouth.  Modified Medications   No medications on file  Discontinued Medications   No medications on file    Allergies Allergies  Allergen Reactions   Penicillin G Itching, Rash, Anaphylaxis and Palpitations   Lamictal  [Lamotrigine ] Rash   Tape    Carbamazepine Other (See Comments)    Medication interaction-prograf     Hydrocodone-Acetaminophen  Itching   Naproxen Itching   Penicillins    Vancomycin  Rash    Macular/blotchy rash at infusion site    Past Medical History Past Medical History:  Diagnosis Date   Abnormal uterine bleeding    Allergy    Anxiety    Arthritis    Bipolar disorder (manic depression) (HCC)    Chronic kidney failure    Chronic renal disease, stage III (HCC)    Diabetes mellitus without complication (HCC)  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HCC) 2015   Right axillary lymph node resected and chemo tx's.   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 02/2023 Echo: EF 55-60%, no rwma, nl RV size/fxn. Mildly dil LA.   Personality disorder (HCC)    Pineal gland cyst    Post herpetic neuralgia    PTSD (post-traumatic stress disorder)    Renal disorder    S/P liver transplant (HCC) 1993   Sleep apnea    has not recieved cpap yet-other one was recalled    Past Surgical History Past Surgical History:  Procedure Laterality Date   BONE MARROW BIOPSY  01/13/2015   BREAST BIOPSY  12/2014   BREAST BIOPSY  2011   BREAST SURGERY     CHOLECYSTECTOMY     COLONOSCOPY     COLONOSCOPY WITH PROPOFOL  N/A 02/28/2022   Procedure: COLONOSCOPY WITH PROPOFOL ;  Surgeon: Marnee Sink, MD;  Location: [facility] ENDOSCOPY;  Service: Endoscopy;  Laterality: N/A;   ESOPHAGOGASTRODUODENOSCOPY     ESOPHAGOGASTRODUODENOSCOPY (EGD) WITH PROPOFOL  N/A 01/09/2021   Procedure: ESOPHAGOGASTRODUODENOSCOPY (EGD) WITH PROPOFOL ;  Surgeon: Marnee Sink, MD;  Location: [facility] ENDOSCOPY;  Service: Endoscopy;  Laterality: N/A;   HERNIA REPAIR     x4-all from liver transplant   HYSTEROSCOPY WITH D & C N/A 11/13/2021   Procedure: DILATATION AND CURETTAGE /HYSTEROSCOPY;  Surgeon: Heron Lord, MD;  Location: [facility] ORS;  Service: Gynecology;  Laterality: N/A;   infusaport     LIVER TRANSPLANT  12/17/1991   LUMBAR PUNCTURE     PORT A CATH INJECTION ([facility] HX)     RENAL BIOPSY     tumor removal  2015   cancer right side    Family History family history includes Bipolar disorder in her mother; Cancer in her maternal aunt, maternal grandfather, and maternal uncle; Cancer - Other in her mother; Diabetes in her father; Heart disease in her father and mother; Hypertension  in her brother, father, and mother; Lupus in her paternal grandmother; Parkinson's disease in her maternal grandmother.  Social History Social History   Socioeconomic History   Marital status: Single    Spouse name: Not on file   Number of children: 0   Years of education: 9   Highest education level: GED or equivalent  Occupational History   Occupation: disabled  Tobacco Use   Smoking status: Never    Passive exposure: Past   Smokeless tobacco: Never  Vaping Use   Vaping status: Never Used  Substance and Sexual Activity   Alcohol use: Not Currently   Drug use: Not Currently    Comment: pain managment last used in early april   Sexual activity: Not Currently  Other Topics Concern   Not on file  Social History Narrative   Not on file   Social Drivers of Health   Financial Resource Strain: Low Risk  (10/07/2023)   Overall Financial Resource Strain (CARDIA)  Difficulty of Paying Living Expenses: Not very hard  Food Insecurity: No Food Insecurity (08/05/2023)   Hunger Vital Sign    Worried About Running Out of Food in the Last Year: Never true    Ran Out of Food in the Last Year: Never true  Transportation Needs: No Transportation Needs (09/05/2023)   PRAPARE - Administrator, Civil Service (Medical): No    Lack of Transportation (Non-Medical): No  Physical Activity: Insufficiently Active (11/07/2023)   Exercise Vital Sign    Days of Exercise per Week: 3 days    Minutes of Exercise per Session: 10 min  Stress: No Stress Concern Present (11/07/2023)   [employer] of Occupational Health - Occupational Stress Questionnaire    Feeling of Stress : Only a little  Social Connections: Moderately Integrated (06/05/2023)   Social Connection and Isolation Panel [NHANES]    Frequency of Communication with Friends and Family: More than three times a week    Frequency of Social Gatherings with Friends and Family: More than three times a week    Attends Religious  Services: More than 4 times per year    Active Member of Clubs or Organizations: No    Attends Banker Meetings: Never    Marital Status: Living with partner  Intimate Partner Violence: Not At Risk (09/05/2023)   Humiliation, Afraid, Rape, and Kick questionnaire    Fear of Current or Ex-Partner: No    Emotionally Abused: No    Physically Abused: No    Sexually Abused: No    Lab Results  Component Value Date   HGBA1C 11.8 (H) 11/13/2023   HGBA1C 10.8 (H) 10/16/2023   HGBA1C 8.8 (H) 03/07/2023   Lab Results  Component Value Date   CHOL 119 11/13/2023   Lab Results  Component Value Date   HDL 37 (L) 11/13/2023   Lab Results  Component Value Date   LDLCALC 50 11/13/2023   Lab Results  Component Value Date   TRIG 303 (H) 11/13/2023   Lab Results  Component Value Date   CHOLHDL 3.2 11/13/2023   Lab Results  Component Value Date   CREATININE 1.01 11/13/2023   No results found for: "GFR" Lab Results  Component Value Date   MICROALBUR 41.5 11/13/2023      Component Value Date/Time   NA 137 11/13/2023 1322   K 4.7 11/13/2023 1322   CL 102 11/13/2023 1322   CO2 22 11/13/2023 1322   GLUCOSE 210 (H) 11/13/2023 1322   BUN 17 11/13/2023 1322   CREATININE 1.01 11/13/2023 1322   CALCIUM  10.0 11/13/2023 1322   CALCIUM  7.6 (L) 06/27/2022 1448   PROT 7.5 11/13/2023 1322   ALBUMIN 3.1 (L) 10/16/2023 0503   AST 26 11/13/2023 1322   ALT 22 11/13/2023 1322   ALKPHOS 59 10/16/2023 0503   BILITOT 0.6 11/13/2023 1322   GFRNONAA >60 10/16/2023 0503   GFRNONAA 64 12/11/2020 0000   GFRAA 74 12/11/2020 0000      Latest Ref Rng & Units 11/13/2023    1:22 PM 10/16/2023    5:03 AM 10/15/2023   10:31 AM  BMP  Glucose 65 - 99 mg/dL 130  865  784   BUN 7 - 25 mg/dL 17  17  24    Creatinine 0.50 - 1.03 mg/dL 6.96  2.95  2.84   BUN/Creat Ratio 6 - 22 (calc) SEE NOTE:     Sodium 135 - 146 mmol/L 137  136  133   Potassium  3.5 - 5.3 mmol/L 4.7  4.0  5.5   Chloride 98 - 110  mmol/L 102  104  98   CO2 20 - 32 mmol/L 22  22  24    Calcium  8.6 - 10.4 mg/dL 21.3  8.5  9.2        Component Value Date/Time   WBC 3.8 (L) 10/16/2023 0503   RBC 3.86 (L) 10/16/2023 0503   HGB 10.9 (L) 10/16/2023 0503   HGB 14.6 10/16/2021 1122   HCT 33.5 (L) 10/16/2023 0503   HCT 44.0 10/16/2021 1122   PLT 87 (L) 10/16/2023 0503   MCV 86.8 10/16/2023 0503   MCV 88 10/16/2021 1122   MCH 28.2 10/16/2023 0503   MCHC 32.5 10/16/2023 0503   RDW 15.0 10/16/2023 0503   RDW 13.7 10/16/2021 1122   LYMPHSABS 1.2 10/15/2023 1031   LYMPHSABS 2.0 10/16/2021 1122   MONOABS 0.9 10/15/2023 1031   EOSABS 0.2 10/15/2023 1031   EOSABS 0.4 10/16/2021 1122   BASOSABS 0.0 10/15/2023 1031   BASOSABS 0.1 10/16/2021 1122     Parts of this note may have been dictated using voice recognition software. There may be variances in spelling and vocabulary which are unintentional. Not all errors are proofread. Please notify the [facility] if any discrepancies are noted or if the meaning of any statement is not clear.

## 2023-11-19 ENCOUNTER — Encounter: Payer: Self-pay | Admitting: "Endocrinology

## 2023-11-27 ENCOUNTER — Telehealth: Payer: Self-pay

## 2023-11-27 ENCOUNTER — Ambulatory Visit: Payer: Medicaid Other | Admitting: "Endocrinology

## 2023-11-27 ENCOUNTER — Other Ambulatory Visit (HOSPITAL_COMMUNITY): Payer: Self-pay

## 2023-11-27 NOTE — Telephone Encounter (Signed)
Pharmacy Patient Advocate Encounter   Received notification from CoverMyMeds that prior authorization for Ozempic is required/requested.   Insurance verification completed.   The patient is insured through [facility] .   Per test claim: PA required; PA submitted to above mentioned insurance via CoverMyMeds Key/confirmation #/EOC [facility] Status is pending

## 2023-11-28 ENCOUNTER — Other Ambulatory Visit: Payer: Self-pay | Admitting: "Endocrinology

## 2023-11-28 ENCOUNTER — Telehealth: Payer: Medicaid Other | Admitting: Psychiatry

## 2023-12-01 ENCOUNTER — Other Ambulatory Visit: Payer: Self-pay | Admitting: Nurse Practitioner

## 2023-12-01 DIAGNOSIS — I10 Essential (primary) hypertension: Secondary | ICD-10-CM

## 2023-12-01 DIAGNOSIS — E1122 Type 2 diabetes mellitus with diabetic chronic kidney disease: Secondary | ICD-10-CM

## 2023-12-01 DIAGNOSIS — E1165 Type 2 diabetes mellitus with hyperglycemia: Secondary | ICD-10-CM

## 2023-12-02 NOTE — Telephone Encounter (Signed)
 Pharmacy Patient Advocate Encounter  Received notification from [facility] that Prior Authorization for Ozempic has been APPROVED through 11/26/24   PA #/Case ID/Reference #: [id number]

## 2023-12-02 NOTE — Telephone Encounter (Signed)
 Requested Prescriptions  Pending Prescriptions Disp Refills   lisinopril (ZESTRIL) 20 MG tablet [Pharmacy Med Name: LISINOPRIL 20 MG TABLET 20 Tablet] 90 tablet 0    Sig: TAKE 1 TABLET BY MOUTH ONCE DAILY     Cardiovascular:  ACE Inhibitors Passed - 12/02/2023  5:16 PM      Passed - Cr in normal range and within 180 days    Creat  Date Value Ref Range Status  11/13/2023 1.01 0.50 - 1.03 mg/dL Final   Creatinine, Urine  Date Value Ref Range Status  11/13/2023 75 20 - 275 mg/dL Final         Passed - K in normal range and within 180 days    Potassium  Date Value Ref Range Status  11/13/2023 4.7 3.5 - 5.3 mmol/L Final         Passed - Patient is not pregnant      Passed - Last BP in normal range    BP Readings from Last 1 Encounters:  11/17/23 112/84         Passed - Valid encounter within last 6 months    Recent Outpatient Visits           2 months ago Viral upper respiratory tract infection   [facility] [name], FNP   8 months ago Sepsis without acute organ dysfunction, due to unspecified organism [facility])   [facility] [name], FNP   10 months ago Essential hypertension   [facility] [name], FNP   1 year ago Essential hypertension   [place] [facility] [name], FNP   1 year ago Essential hypertension   [facility] [name], FNP       Future Appointments             In 2 weeks Carlos Levering, NP [place]             metFORMIN (GLUCOPHAGE) 1000 MG tablet [Pharmacy Med Name: METFORMIN 1000MG  TABLET 1000 Tablet] 180 tablet 0    Sig: TAKE ONE (1) TABLET BY MOUTH TWICE DAILY WITH MEALS     Endocrinology:  Diabetes - Biguanides Failed - 12/02/2023  5:16 PM      Failed - HBA1C is between 0 and 7.9 and within 180 days    Hemoglobin A1C  Date Value Ref Range Status   03/06/2022 6.4  Final   Hgb A1c MFr Bld  Date Value Ref Range Status  11/13/2023 11.8 (H) <5.7 % of total Hgb Final    Comment:    For someone without known diabetes, a hemoglobin A1c value of 6.5% or greater indicates that they may have  diabetes and this should be confirmed with a follow-up  test. . For someone with known diabetes, a value <7% indicates  that their diabetes is well controlled and a value  greater than or equal to 7% indicates suboptimal  control. A1c targets should be individualized based on  duration of diabetes, age, comorbid conditions, and  other considerations. . Currently, no consensus exists regarding use of hemoglobin A1c for diagnosis of diabetes for children. .          Failed - B12 Level in normal range and within 720 days    No results found for: "VITAMINB12"       Passed - Cr in normal range and within 360 days  Creat  Date Value Ref Range Status  11/13/2023 1.01 0.50 - 1.03 mg/dL Final   Creatinine, Urine  Date Value Ref Range Status  11/13/2023 75 20 - 275 mg/dL Final         Passed - eGFR in normal range and within 360 days    GFR, Est African American  Date Value Ref Range Status  12/11/2020 74 > OR = 60 mL/min/1.31m2 Final   GFR, Est Non African American  Date Value Ref Range Status  12/11/2020 64 > OR = 60 mL/min/1.53m2 Final   GFR, Estimated  Date Value Ref Range Status  10/16/2023 >60 >60 mL/min Final    Comment:    (NOTE) Calculated using the CKD-EPI Creatinine Equation (2021)    eGFR  Date Value Ref Range Status  05/27/2022 29 (L) > OR = 60 mL/min/1.27m2 Final         Passed - Valid encounter within last 6 months    Recent Outpatient Visits           2 months ago Viral upper respiratory tract infection   [place] [facility] [name], FNP   8 months ago Sepsis without acute organ dysfunction, due to unspecified organism [facility])   [facility] [name], FNP   10 months ago Essential hypertension   [place] [facility] [name], FNP   1 year ago Essential hypertension   [place] [name], FNP   1 year ago Essential hypertension   [place] [facility] [name], FNP       Future Appointments             In 2 weeks Carlos Levering, NP  [facility] - CBC within normal limits and completed in the last 12 months    WBC  Date Value Ref Range Status  10/16/2023 3.8 (L) 4.0 - 10.5 K/uL Final   RBC  Date Value Ref Range Status  10/16/2023 3.86 (L) 3.87 - 5.11 MIL/uL Final   Hemoglobin  Date Value Ref Range Status  10/16/2023 10.9 (L) 12.0 - 15.0 g/dL Final  60/45/4098 11.9 11.1 - 15.9 g/dL Final   HCT  Date Value Ref Range Status  10/16/2023 33.5 (L) 36.0 - 46.0 % Final   Hematocrit  Date Value Ref Range Status  10/16/2021 44.0 34.0 - 46.6 % Final   MCHC  Date Value Ref Range Status  10/16/2023 32.5 30.0 - 36.0 g/dL Final   [facility]  Date Value Ref Range Status  10/16/2023 28.2 26.0 - 34.0 pg Final   MCV  Date Value Ref Range Status  10/16/2023 86.8 80.0 - 100.0 fL Final  10/16/2021 88 79 - 97 fL Final   No results found for: "PLTCOUNTKUC", "LABPLAT", "POCPLA" RDW  Date Value Ref Range Status  10/16/2023 15.0 11.5 - 15.5 % Final  10/16/2021 13.7 11.7 - 15.4 % Final          Pt will need to schedule an appointment for additional refills.

## 2023-12-03 DIAGNOSIS — M79603 Pain in arm, unspecified: Secondary | ICD-10-CM | POA: Diagnosis not present

## 2023-12-03 DIAGNOSIS — F3189 Other bipolar disorder: Secondary | ICD-10-CM | POA: Diagnosis not present

## 2023-12-03 DIAGNOSIS — M79669 Pain in unspecified lower leg: Secondary | ICD-10-CM | POA: Diagnosis not present

## 2023-12-03 DIAGNOSIS — I10 Essential (primary) hypertension: Secondary | ICD-10-CM | POA: Diagnosis not present

## 2023-12-03 DIAGNOSIS — Z79891 Long term (current) use of opiate analgesic: Secondary | ICD-10-CM | POA: Diagnosis not present

## 2023-12-03 DIAGNOSIS — M5414 Radiculopathy, thoracic region: Secondary | ICD-10-CM | POA: Diagnosis not present

## 2023-12-03 DIAGNOSIS — G893 Neoplasm related pain (acute) (chronic): Secondary | ICD-10-CM | POA: Diagnosis not present

## 2023-12-03 DIAGNOSIS — M5442 Lumbago with sciatica, left side: Secondary | ICD-10-CM | POA: Diagnosis not present

## 2023-12-03 DIAGNOSIS — G894 Chronic pain syndrome: Secondary | ICD-10-CM | POA: Diagnosis not present

## 2023-12-03 DIAGNOSIS — M25519 Pain in unspecified shoulder: Secondary | ICD-10-CM | POA: Diagnosis not present

## 2023-12-03 DIAGNOSIS — M79606 Pain in leg, unspecified: Secondary | ICD-10-CM | POA: Diagnosis not present

## 2023-12-04 ENCOUNTER — Ambulatory Visit: Payer: Medicaid Other | Admitting: Nurse Practitioner

## 2023-12-05 ENCOUNTER — Inpatient Hospital Stay: Payer: Medicaid Other | Attending: Oncology

## 2023-12-05 ENCOUNTER — Inpatient Hospital Stay (HOSPITAL_BASED_OUTPATIENT_CLINIC_OR_DEPARTMENT_OTHER): Payer: Medicaid Other | Admitting: Oncology

## 2023-12-05 ENCOUNTER — Encounter: Payer: Self-pay | Admitting: Oncology

## 2023-12-05 VITALS — BP 131/80 | HR 81 | Temp 98.0°F | Resp 18 | Wt 252.3 lb

## 2023-12-05 DIAGNOSIS — Z8572 Personal history of non-Hodgkin lymphomas: Secondary | ICD-10-CM | POA: Diagnosis not present

## 2023-12-05 DIAGNOSIS — Z9221 Personal history of antineoplastic chemotherapy: Secondary | ICD-10-CM | POA: Diagnosis not present

## 2023-12-05 DIAGNOSIS — Z08 Encounter for follow-up examination after completed treatment for malignant neoplasm: Secondary | ICD-10-CM

## 2023-12-05 DIAGNOSIS — Z944 Liver transplant status: Secondary | ICD-10-CM | POA: Diagnosis not present

## 2023-12-05 DIAGNOSIS — Z95828 Presence of other vascular implants and grafts: Secondary | ICD-10-CM

## 2023-12-05 DIAGNOSIS — Z79891 Long term (current) use of opiate analgesic: Secondary | ICD-10-CM | POA: Diagnosis not present

## 2023-12-05 LAB — CBC WITH DIFFERENTIAL/PLATELET
Abs Immature Granulocytes: 0.03 10*3/uL (ref 0.00–0.07)
Basophils Absolute: 0 10*3/uL (ref 0.0–0.1)
Basophils Relative: 1 %
Eosinophils Absolute: 0.4 10*3/uL (ref 0.0–0.5)
Eosinophils Relative: 7 %
HCT: 38.2 % (ref 36.0–46.0)
Hemoglobin: 13.1 g/dL (ref 12.0–15.0)
Immature Granulocytes: 1 %
Lymphocytes Relative: 39 %
Lymphs Abs: 2.3 10*3/uL (ref 0.7–4.0)
MCH: 29.1 pg (ref 26.0–34.0)
MCHC: 34.3 g/dL (ref 30.0–36.0)
MCV: 84.9 fL (ref 80.0–100.0)
Monocytes Absolute: 0.4 10*3/uL (ref 0.1–1.0)
Monocytes Relative: 7 %
Neutro Abs: 2.7 10*3/uL (ref 1.7–7.7)
Neutrophils Relative %: 45 %
Platelets: 109 10*3/uL — ABNORMAL LOW (ref 150–400)
RBC: 4.5 MIL/uL (ref 3.87–5.11)
RDW: 13.5 % (ref 11.5–15.5)
WBC: 5.9 10*3/uL (ref 4.0–10.5)
nRBC: 0 % (ref 0.0–0.2)

## 2023-12-05 LAB — LACTATE DEHYDROGENASE: LDH: 146 U/L (ref 98–192)

## 2023-12-05 MED ORDER — SODIUM CHLORIDE 0.9% FLUSH
10.0000 mL | Freq: Once | INTRAVENOUS | Status: AC
Start: 2023-12-05 — End: 2023-12-05
  Administered 2023-12-05: 10 mL via INTRAVENOUS
  Filled 2023-12-05: qty 10

## 2023-12-05 MED ORDER — HEPARIN SOD (PORK) LOCK FLUSH 100 UNIT/ML IV SOLN
500.0000 [IU] | Freq: Once | INTRAVENOUS | Status: AC
  Administered 2023-12-05: 500 [IU] via INTRAVENOUS
  Filled 2023-12-05: qty 5

## 2023-12-05 NOTE — Progress Notes (Signed)
 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  She is currently on CellCept and tacrolimus for the same.  She also has a history of post transplant lymphoproliferative disorder/DLBCL that was diagnosed in 2016.  She is s/p 6 cycles of R-CHOP chemotherapy back then and was in complete remission 1.  Her other past medical history significant for migraines, stage III chronic kidney disease, chemo-induced peripheral neuropathy, hypertension among other medical problems.  With regards to her diffuse large B-cell lymphoma she was getting surveillance scans and this was all done in [place].  She has now moved to [facility] to be with her significant other.  Patient was noted to have a prior kidney mass before which was biopsied and was not consistent with malignancy.   She underwent CT neck with contrast before she left [place] on 02/22/2019.  She was noted to have soft tissue masses in both the parotid glands which were new as compared to prior exams and possibly represent lymph nodes.  The largest one was seen in the left parotid gland measuring 1.3 x 1.1 cm.   Before she could get a work-up for this patient moved to [place] and has not had any further work-up yet   Her other prior imaging is as follows: MRI thoracic spine without contrast in August 2019 showed moderate multilevel spondylosis but no evidence of lymphoma.  Multinodular thyroid in June 2019.  MRI cervical spine May 2019 again showed spondylosis but no other acute pathology.  MRI brain showed incidental partial opacification of the right and right sinus.  No evidence of malignancy.  I do not have any other PET CT scan or treatment records from the past.   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  Bone marrow biopsy was negative for lymphoma.  Patient completed 4 cycles of weekly RituxanIn September 2020.  Repeat PET CT scan showed interval resolution of the parotid lesions compatible with complete response to therapy  Interval history-patient was admitted to the hospital in January 2025 for flu.  She has recovered uneventfully.  Overall she feels well today and denies any specific complaints at this time  ECOG PS- 1 Pain scale- 0  Review of systems- Review of Systems  Constitutional:  Negative for chills, fever, malaise/fatigue and weight loss.  HENT:  Negative for congestion, ear discharge and nosebleeds.   Eyes:  Negative for blurred vision.  Respiratory:  Negative for cough, hemoptysis, sputum production, shortness of breath and wheezing.   Cardiovascular:  Negative for chest pain, palpitations, orthopnea and claudication.  Gastrointestinal:  Negative for abdominal pain, blood in stool, constipation, diarrhea, heartburn, melena, nausea and vomiting.  Genitourinary:  Negative for dysuria, flank pain, frequency, hematuria and urgency.  Musculoskeletal:  Negative for back pain, joint pain and myalgias.  Skin:  Negative for rash.  Neurological:  Negative for dizziness, tingling, focal weakness, seizures, weakness and headaches.   Endo/Heme/Allergies:  Does not bruise/bleed easily.  Psychiatric/Behavioral:  Negative for depression and suicidal ideas. The patient does not have insomnia.       Allergies  Allergen Reactions   Penicillin G Itching, Rash, Anaphylaxis and Palpitations   Lamictal [Lamotrigine] Rash   Tape    Carbamazepine Other (See Comments)    Medication interaction-prograf    Hydrocodone-Acetaminophen Itching   Naproxen Itching   Penicillins    Vancomycin Rash    Macular/blotchy rash at infusion site     Past Medical History:  Diagnosis Date   Abnormal uterine bleeding    Allergy    Anxiety    Arthritis    Bipolar disorder (manic depression) (HCC)    Chronic kidney failure    Chronic renal disease, stage III (HCC)    Diabetes mellitus without complication (HCC)    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HCC) 2015   Right axillary lymph node resected and chemo tx's.   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 02/2023 Echo: EF 55-60%, no rwma, nl RV size/fxn. Mildly dil LA.   Personality disorder (HCC)    Pineal gland cyst    Post herpetic neuralgia    PTSD (post-traumatic stress disorder)    Renal disorder    S/P liver transplant (HCC) 1993   Sleep apnea    has not recieved cpap yet-other one was recalled     Past Surgical History:  Procedure Laterality Date   BONE MARROW BIOPSY  01/13/2015   BREAST BIOPSY  12/2014   BREAST BIOPSY  2011   BREAST SURGERY     CHOLECYSTECTOMY     COLONOSCOPY     COLONOSCOPY WITH PROPOFOL N/A 02/28/2022   Procedure: COLONOSCOPY WITH  PROPOFOL;  Surgeon: Midge Minium, MD;  Location: [facility] END[facility]COPY;  Service: Endoscopy;  Laterality: N/A;   ESOPHAGOGASTRODUODENOSCOPY     ESOPHAGOGASTRODUODENOSCOPY (EGD) WITH PROPOFOL N/A 01/09/2021   Procedure: ESOPHAGOGASTRODUODENOSCOPY (EGD) WITH PROPOFOL;  Surgeon: Midge Minium, MD;  Location: [facility] ENDOSCOPY;  Service: Endoscopy;  Laterality: N/A;   HERNIA REPAIR     x4-all from liver transplant   HYSTEROSCOPY WITH D & C N/A 11/13/2021   Procedure: DILATATION AND CURETTAGE /HYSTEROSCOPY;  Surgeon: Natale Milch, MD;  Location: [facility] ORS;  Service: Gynecology;  Laterality: N/A;   infusaport     LIVER TRANSPLANT  12/17/1991   LUMBAR PUNCTURE     PORT A CATH INJECTION ([facility] HX)     RENAL BIOPSY     tumor removal  2015   cancer right side    Social History   Socioeconomic History   Marital status: Single    Spouse name: Not on file   Number of children: 0   Years of education: 9   Highest  education level: GED or equivalent  Occupational History   Occupation: disabled  Tobacco Use   Smoking status: Never    Passive exposure: Past   Smokeless tobacco: Never  Vaping Use   Vaping status: Never Used  Substance and Sexual Activity   Alcohol use: Not Currently   Drug use: Not Currently    Comment: pain managment last used in early april   Sexual activity: Not Currently  Other Topics Concern   Not on file  Social History Narrative   Not on file   Social Drivers of Health   Financial Resource Strain: Low Risk  (10/07/2023)   Overall Financial Resource Strain (CARDIA)    Difficulty of Paying Living Expenses: Not very hard  Food Insecurity: No Food Insecurity (08/05/2023)   Hunger Vital Sign    Worried About Running Out of Food in the Last Year: Never true    Ran Out of Food in the Last Year: Never true  Transportation Needs: No Transportation Needs (09/05/2023)   PRAPARE - Administrator, Civil Service (Medical): No    Lack of Transportation (Non-Medical):  No  Physical Activity: Insufficiently Active (11/07/2023)   Exercise Vital Sign    Days of Exercise per Week: 3 days    Minutes of Exercise per Session: 10 min  Stress: No Stress Concern Present (11/07/2023)   [employer] of Occupational Health - Occupational Stress Questionnaire    Feeling of Stress : Only a little  Social Connections: Moderately Integrated (06/05/2023)   Social Connection and Isolation Panel [NHANES]    Frequency of Communication with Friends and Family: More than three times a week    Frequency of Social Gatherings with Friends and Family: More than three times a week    Attends Religious Services: More than 4 times per year    Active Member of [employer] or Organizations: No    Attends Banker Meetings: Never    Marital Status: Living with partner  Intimate Partner Violence: Not At Risk (09/05/2023)   Humiliation, Afraid, Rape, and Kick questionnaire    Fear of Current or Ex-Partner: No    Emotionally Abused: No    Physically Abused: No    Sexually Abused: No    Family History  Problem Relation Age of Onset   Heart disease Mother    Hypertension Mother    Cancer - Other Mother    Bipolar disorder Mother    Heart disease Father    Hypertension Father    Diabetes Father    Parkinson's disease Maternal Grandmother    Cancer Maternal Aunt    Cancer Maternal Uncle    Cancer Maternal Grandfather    Lupus Paternal Grandmother    Hypertension Brother      Current Outpatient Medications:    carvedilol (COREG) 3.125 MG tablet, , Disp: , Rfl:    Accu-Chek Softclix Lancets lancets, 2 (two) times daily., Disp: , Rfl:    albuterol (VENTOLIN HFA) 108 (90 Base) MCG/ACT inhaler, Inhale 2 puffs into the lungs every 6 (six) hours as needed for wheezing or shortness of breath., Disp: 8 g, Rfl: 0   baclofen (LIORESAL) 10 MG tablet, Take 1 tablet by mouth 2 (two) times daily., Disp: , Rfl:    benzonatate (TESSALON) 100 MG capsule, Take 2 capsules (200 mg  total) by mouth 2 (two) times daily as needed for cough., Disp: 20 capsule, Rfl: 0   benztropine (COGENTIN) 1 MG tablet, TAKE 1 TABLET BY MOUTH ONCE DAILY AS  NEEDED FOR TREMORS, Disp: 30 tablet, Rfl: 10   Cholecalciferol (VITAMIN D) 125 MCG (5000 UT) CAPS, Take 5,000 Units by mouth daily., Disp: , Rfl:    Continuous Glucose Sensor (DEXCOM G7 SENSOR) MISC, 1 Device by Does not apply route continuous., Disp: 9 each, Rfl: 3   Cranberry-Vitamin C-Probiotic (AZO CRANBERRY PO), Take by mouth. (Patient not taking: Reported on 11/17/2023), Disp: , Rfl:    ezetimibe (ZETIA) 10 MG tablet, Take 10 mg by mouth every morning., Disp: , Rfl:    fluticasone (FLONASE) 50 MCG/ACT nasal spray, Place into the nose., Disp: , Rfl:    glipiZIDE (GLUCOTROL XL) 10 MG 24 hr tablet, Take 1 tablet (10 mg total) by mouth in the morning and at bedtime., Disp: 60 tablet, Rfl: 5   ibuprofen (ADVIL) 400 MG tablet, Take 1 tablet (400 mg total) by mouth every 6 (six) hours as needed., Disp: 30 tablet, Rfl: 0   Insulin Glargine (BASAGLAR KWIKPEN) 100 UNIT/ML, Inject 40 Units into the skin at bedtime., Disp: , Rfl:    Insulin Pen Needle 32G X 6 MM MISC, 1 each by Does not apply route as needed., Disp: 100 each, Rfl: 5   lisinopril (ZESTRIL) 20 MG tablet, TAKE 1 TABLET BY MOUTH ONCE DAILY, Disp: 90 tablet, Rfl: 0   lurasidone (LATUDA) 20 MG TABS tablet, TAKE 1 TABLET BY MOUTH ONCE DAILY WITH SUPPER *REFILL REQUEST*, Disp: 90 tablet, Rfl: 10   magnesium oxide (MAG-OX) 400 MG tablet, Take 1,000 mg by mouth 2 (two) times daily., Disp: , Rfl:    Melatonin 10 MG TABS, Take 10 mg by mouth at bedtime., Disp: , Rfl:    metFORMIN (GLUCOPHAGE) 1000 MG tablet, TAKE ONE (1) TABLET BY MOUTH TWICE DAILY WITH MEALS, Disp: 180 tablet, Rfl: 0   mirabegron ER (MYRBETRIQ) 50 MG TB24 tablet, Take 1 tablet (50 mg total) by mouth daily., Disp: 90 tablet, Rfl: 3   mirtazapine (REMERON) 15 MG tablet, TAKE 1 TABLET BY MOUTH EVERY DAY AT BEDTIME *REFILL REQUEST*,  Disp: 90 tablet, Rfl: 10   naloxone (NARCAN) nasal spray 4 mg/0.1 mL, Place 0.4 mg into the nose once., Disp: , Rfl:    NOVOLOG FLEXPEN 100 UNIT/ML FlexPen, Inject 0-30 Units into the skin 3 (three) times daily with meals. Sliding scale, Disp: , Rfl:    ondansetron (ZOFRAN) 4 MG tablet, Take 1 tablet (4 mg total) by mouth every 6 (six) hours as needed for nausea., Disp: 20 tablet, Rfl: 0   oxyCODONE (OXY IR/ROXICODONE) 5 MG immediate release tablet, Take 5 mg by mouth 3 (three) times daily as needed., Disp: , Rfl:    PRECISION QID TEST test strip, , Disp: , Rfl:    pregabalin (LYRICA) 200 MG capsule, Take 1 capsule (200 mg total) by mouth 2 (two) times daily., Disp: 60 capsule, Rfl: 0   rosuvastatin (CRESTOR) 10 MG tablet, Take 1 tablet (10 mg total) by mouth daily., Disp: 90 tablet, Rfl: 2   Semaglutide,0.25 or 0.5MG /DOS, 2 MG/3ML SOPN, Inject 0.25 mg into the skin once a week., Disp: 3 mL, Rfl: 0   tacrolimus (PROGRAF) 1 MG capsule, Take 1 capsule by mouth every morning., Disp: , Rfl:    UBRELVY 50 MG TABS, Take 1 tablet by mouth daily as needed., Disp: , Rfl:    venlafaxine XR (EFFEXOR-XR) 75 MG 24 hr capsule, Take 1 capsule (75 mg total) by mouth daily with breakfast. Take along with 37.5 mg daily, Disp: 30 capsule, Rfl: 10  Xylitol (XYLIMELTS) 550 MG DISK, Take by mouth., Disp: , Rfl:   Physical exam:  Vitals:   12/05/23 1028  BP: 131/80  Pulse: 81  Resp: 18  Temp: 98 F (36.7 C)  TempSrc: Tympanic  SpO2: 98%  Weight: 252 lb 4.8 oz (114.4 kg)   Physical Exam Cardiovascular:     Rate and Rhythm: Normal rate and regular rhythm.     Heart sounds: Normal heart sounds.  Pulmonary:     Effort: Pulmonary effort is normal.     Breath sounds: Normal breath sounds.  Abdominal:     General: Bowel sounds are normal.     Palpations: Abdomen is soft.  Lymphadenopathy:     Comments: No palpable cervical, supraclavicular, axillary or inguinal adenopathy    Skin:    General: Skin is  warm and dry.  Neurological:     Mental Status: She is alert and oriented to person, place, and time.         Latest Ref Rng & Units 11/13/2023    1:22 PM  CMP  Glucose 65 - 99 mg/dL 161   BUN 7 - 25 mg/dL 17   Creatinine 0.96 - 1.03 mg/dL 0.45   Sodium 409 - 811 mmol/L 137   Potassium 3.5 - 5.3 mmol/L 4.7   Chloride 98 - 110 mmol/L 102   CO2 20 - 32 mmol/L 22   Calcium 8.6 - 10.4 mg/dL 91.4   Total Protein 6.1 - 8.1 g/dL 7.5   Total Bilirubin 0.2 - 1.2 mg/dL 0.6   AST 10 - 35 U/L 26   ALT 6 - 29 U/L 22       Latest Ref Rng & Units 12/05/2023   10:04 AM  CBC  WBC 4.0 - 10.5 K/uL 5.9   Hemoglobin 12.0 - 15.0 g/dL 78.2   Hematocrit 95.6 - 46.0 % 38.2   Platelets 150 - 400 K/uL 109     Assessment and plan- Patient is a 48 y.o. 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   Clinically patient is doing well with no concerning signs and symptoms of recurrence based on today's exam.  No B symptoms or palpable  adenopathy.  She had CT chest abdomen and pelvis with contrast in May 2024 in the ER which did not show any evidence of recurrent or progressive disease.  She wants to keep her port in place and will get it flushed every 3 months.  I will see her back in 1 year with CBC with differential CMP and LDH.  Patient has a history of IgG lambda MGUS and we will check those labs in 3 months and again in 1 year when I see her   Visit Diagnosis 1. Encounter for follow-up surveillance of lymphoma      Dr. Owens Shark, MD, MPH [facility] [phone] 12/05/2023 11:52 AM

## 2023-12-07 ENCOUNTER — Other Ambulatory Visit: Payer: Self-pay | Admitting: Nurse Practitioner

## 2023-12-07 DIAGNOSIS — E1122 Type 2 diabetes mellitus with diabetic chronic kidney disease: Secondary | ICD-10-CM

## 2023-12-07 DIAGNOSIS — E1165 Type 2 diabetes mellitus with hyperglycemia: Secondary | ICD-10-CM

## 2023-12-07 DIAGNOSIS — I10 Essential (primary) hypertension: Secondary | ICD-10-CM

## 2023-12-08 ENCOUNTER — Other Ambulatory Visit: Payer: Medicaid Other | Admitting: Obstetrics and Gynecology

## 2023-12-08 NOTE — Patient Instructions (Signed)
 Visit Information  Alison [name] was given information about Medicaid Managed Care team care coordination services as a part of their Healthy [facility] Medicaid benefit. Alison [name] verbally consented to engagement with the [facility] Managed Care team.   If you are experiencing a medical emergency, please call 911 or report to your local emergency department or urgent care.   If you have a non-emergency medical problem during routine business hours, please contact your provider's office and ask to speak with a nurse.   For questions related to your Healthy [facility] health plan, please call: [phone] or visit the homepage here: [email or web address]  If you would like to schedule transportation through your Healthy [facility] plan, please call the following number at least 2 days in advance of your appointment: [phone]  For information about your ride after you set it up, call Ride Assist at [phone]. Use this number to activate a Will Call pickup, or if your transportation is late for a scheduled pickup. Use this number, too, if you need to make a change or cancel a previously scheduled reservation.  If you need transportation services right away, call [phone]. The after-hours call center is staffed 24 hours to handle ride assistance and urgent reservation requests (including discharges) 365 days a year. Urgent trips include sick visits, hospital discharge requests and life-sustaining treatment.  Call the [facility] Line at [phone], at any time, 24 hours a day, 7 days a week. If you are in danger or need immediate medical attention call 911.  If you would like help to quit smoking, call 1-800-QUIT-NOW (909-198-3243) OR Espaol: 1-855-Djelo-Ya (4-742-595-6387) o para ms informacin haga clic aqu or Text READY to 564-332 to register via text  Alison [name] - following are the goals we discussed in your visit today:    Goals Addressed             This Visit's Progress    Chronic Pain Managed       12/08/23:  No complaints today-pain is managed.    Get sugars at goal       Timeframe:  Short-Term Goal Priority:  High Start Date:                             Expected End Date:      ongoing                 Follow Up Date: 01/22/24   - call for medicine refill 2 or 3 days before it runs out - call if I am sick and can't take my medicine - keep a list of all the medicines I take; vitamins and herbals too    Why is this important?   These steps will help you keep on track with your medicines. 12/08/23: Followed by ENDO-Nutrition referral placed- to get Dexcom.   Patient verbalizes understanding of instructions and care plan provided today and agrees to view in MyChart. Active MyChart status and patient understanding of how to access instructions and care plan via MyChart confirmed with patient.     The Managed Medicaid care management team will reach out to the patient again over the next 45 business  days.  The  Patient  has been provided with contact information for the Managed Medicaid care management team and has been advised to call with any health related questions or concerns.   Alison Der RN, [name], [employer] Value-Based Care [employer] [facility] RN  Care Manager Direct Dial [phone]/KGM [phone]\ Website: [email or web address]   Following is a copy of your plan of care:  Care Plan : Chronic Pain (Adult)  Updates made by [name], RN since 12/08/2023 12:00 AM     Problem: Chronic Pain Management-fibromyalgia   Priority: High  Onset Date: 01/22/2021     Long-Range Goal: Fibromyalgia pain managed-new pain management provider   Start Date: 08/14/2020  Expected End Date: 02/05/2024  Recent Progress: On track  Priority: High  Note:   Current Barriers:  Knowledge deficits related to pain management  12/08/23: No complaints today.  Patient to follow up on dentures-to check with dental office  and MM regarding approval.  Nutrition  referral placed by ENDO-to get Dexcom-medications adjusted.  Blood sugars 200s per patient.  BP stable. Recent ONC appt-f/u in 1 year.   Nurse Case Manager Clinical Goal(s):  Over the next 45 days, patient will work with [facility] to address needs related to referral for pain management provider and associated care coordination needs. 05/24/22:  patient is currently seeing Dr. Welton Flakes at [facility].  Interventions:  Inter-disciplinary care team collaboration (see longitudinal plan of care) Evaluation of current treatment plan related to fibromyalgia  and patient's adherence to plan as established by provider. Update 06/15/21:  Patient taking Baclofen again, Effexor added. Collaborated with primary care provider regarding recommendations and referral to pain management provider. Discussed plans with patient for ongoing care management follow up and provided patient with direct contact information for care management team Anticipate pain education program, pain management support as part of pain management referral.       Patient Goals/Self-Care Activities Over the next 45 days, patient will:  -Attends all scheduled provider appointments  develop a personal pain management plan with your pain management provider. Patient will f/u with Dr. Welton Flakes and insurance regarding auth.  Follow Up Plan:  The Managed Medicaid care management team will reach out to the patient again over the next 45 business days.

## 2023-12-08 NOTE — Patient Outreach (Signed)
 Medicaid Managed Care   Nurse Care Manager Note  12/08/2023 Name:  Alison [name] MRN:  [id number] DOB:  27-Aug-1976  Alison [name] is an 47 y.o. year old female who is a primary patient of Alison Salines, FNP.  The [facility] Managed Care Coordination team was consulted for assistance with:    Chronic healthcare management needs, HTN, DM, chronic pain, anxiety/depression/BP/PTSD/OSD, migraines, DLBCL, HLD, fibromyalgia, osteoarthritis, h/o liver transplant  [name] was given information about Medicaid Managed Care Coordination team services today. Alison [name] Patient agreed to services and verbal consent obtained.  Engaged with patient by telephone for follow up visit in response to provider referral for case management and/or care coordination services.   Patient is participating in a Managed Medicaid Plan:  Yes  Assessments/Interventions:  Review of past medical history, allergies, medications, health status, including review of consultants reports, laboratory and other test data, was performed as part of comprehensive evaluation and provision of chronic care management services.  SDOH (Social Drivers of Health) assessments and interventions performed: SDOH Interventions    Flowsheet Row Patient Outreach Telephone from 12/08/2023 in [place] POPULATION HEALTH DEPARTMENT Patient Outreach Telephone from 11/07/2023 in [place] POPULATION HEALTH DEPARTMENT Patient Outreach Telephone from 10/07/2023 in [place] POPULATION HEALTH DEPARTMENT Patient Outreach Telephone from 09/05/2023 in [place] POPULATION HEALTH DEPARTMENT Patient Outreach Telephone from 08/05/2023 in [place] POPULATION HEALTH DEPARTMENT Patient Outreach Telephone from 06/05/2023 in [place] POPULATION HEALTH DEPARTMENT  SDOH Interventions        Food Insecurity Interventions -- -- -- -- Intervention Not Indicated --  Housing Interventions -- -- -- -- Intervention Not Indicated --  Transportation  Interventions -- -- -- Intervention Not Indicated -- --  Utilities Interventions Intervention Not Indicated -- -- -- -- Intervention Not Indicated  Alcohol Usage Interventions -- -- Intervention Not Indicated (Score <7) -- -- --  Financial Strain Interventions -- -- Intervention Not Indicated -- -- --  Physical Activity Interventions -- Other (Comments)  [can't do that type of exercise] -- -- -- --  Stress Interventions -- Other (Comment)  [patient sees therapist/Psychiatrist] -- -- -- --  Social Connections Interventions -- -- -- -- -- Intervention Not Indicated  Health Literacy Interventions Intervention Not Indicated -- -- -- -- --     Care Plan Allergies  Allergen Reactions   Penicillin G Itching, Rash, Anaphylaxis and Palpitations   Lamictal [Lamotrigine] Rash   Tape    Carbamazepine Other (See Comments)    Medication interaction-prograf    Hydrocodone-Acetaminophen Itching   Naproxen Itching   Penicillins    Vancomycin Rash    Macular/blotchy rash at infusion site   Medications Reviewed Today     Reviewed by Alison Chandler, RN (Registered Nurse) on 12/08/23 at 1528  Med List Status: <None>   Medication Order Taking? Sig Documenting Provider Last Dose Status Informant  Accu-Chek Softclix Lancets lancets [id number] No 2 (two) times daily. [provider] Taking Active Self  albuterol (VENTOLIN HFA) 108 (90 Base) MCG/ACT inhaler [id number] No Inhale 2 puffs into the lungs every 6 (six) hours as needed for wheezing or shortness of breath. Alison Salines, FNP Taking Active Self  baclofen (LIORESAL) 10 MG tablet [id number] No Take 1 tablet by mouth 2 (two) times daily. [provider] Taking Active Self  benzonatate (TESSALON) 100 MG capsule [id number] No Take 2 capsules (200 mg total) by mouth 2 (two) times daily as needed for cough. Alison Salines, FNP Taking Active  Self  benztropine (COGENTIN) 1 MG tablet [id number] No TAKE 1 TABLET BY MOUTH ONCE DAILY AS NEEDED  FOR TREMORS [name], [name], MD Taking Active Self  carvedilol (COREG) 3.125 MG tablet [id number]   [provider]  Active   Cholecalciferol (VITAMIN D) 125 MCG (5000 UT) CAPS [id number] No Take 5,000 Units by mouth daily. [provider] Taking Active Self  Continuous Glucose Sensor (DEXCOM G7 SENSOR) MISC [id number]  1 Device by Does not apply route continuous. Alison Shuqualak, MD  Active   Cranberry-Vitamin C-Probiotic (AZO CRANBERRY PO) [id number] No Take by mouth.  Patient not taking: Reported on 11/17/2023   [provider] Not Taking Active Self  ezetimibe (ZETIA) 10 MG tablet [id number] No Take 10 mg by mouth every morning. [provider] Taking Active Self  fluticasone (FLONASE) 50 MCG/ACT nasal spray [id number] No Place into the nose. [provider] Taking Active Self  glipiZIDE (GLUCOTROL XL) 10 MG 24 hr tablet [id number]  Take 1 tablet (10 mg total) by mouth in the morning and at bedtime. Alison Ramsey, MD  Active   ibuprofen (ADVIL) 400 MG tablet [id number] No Take 1 tablet (400 mg total) by mouth every 6 (six) hours as needed. Alison Duster, MD Taking Active Self  Insulin Glargine [facility] KWIKPEN) 100 UNIT/ML [id number] No Inject 40 Units into the skin at bedtime. [provider] Taking Active Self  Insulin Pen Needle 32G X 6 MM MISC [id number] No 1 each by Does not apply route as needed. Alison Salines, FNP Taking Active Self  lisinopril (ZESTRIL) 20 MG tablet [id number]  TAKE 1 TABLET BY MOUTH ONCE DAILY Alison Salines, FNP  Active   lurasidone (LATUDA) 20 MG TABS tablet [id number] No TAKE 1 TABLET BY MOUTH ONCE DAILY WITH SUPPER *REFILL REQUESTJomarie Longs, MD Taking Active Self  magnesium oxide (MAG-OX) 400 MG tablet [id number] No Take 1,000 mg by mouth 2 (two) times daily. [provider] Taking Active Self  Melatonin 10 MG TABS [id number] No Take 10 mg by mouth at bedtime. [provider] Taking Active  Self  metFORMIN (GLUCOPHAGE) 1000 MG tablet [id number]  TAKE ONE (1) TABLET BY MOUTH TWICE DAILY WITH MEALS Alison Salines, FNP  Active   mirabegron ER (MYRBETRIQ) 50 MG TB24 tablet [id number] No Take 1 tablet (50 mg total) by mouth daily. Alison Salines, FNP Taking Active Self  mirtazapine (REMERON) 15 MG tablet [id number] No TAKE 1 TABLET BY MOUTH EVERY DAY AT BEDTIME *REFILL REQUESTJomarie Longs, MD Taking Active Self  naloxone [facility]) nasal spray 4 mg/0.1 mL [id number] No Place 0.4 mg into the nose once. [provider] Taking Active Self           Med Note Aundria Rud, Tresa Endo S   Wed Oct 15, 2023  1:51 PM) prn  NOVOLOG FLEXPEN 100 UNIT/ML FlexPen [id number] No Inject 0-30 Units into the skin 3 (three) times daily with meals. Sliding scale [provider] Taking Active Self  ondansetron (ZOFRAN) 4 MG tablet [id number] No Take 1 tablet (4 mg total) by mouth every 6 (six) hours as needed for nausea. Arnetha Courser, MD Taking Active   oxyCODONE (OXY IR/ROXICODONE) 5 MG immediate release tablet [id number] No Take 5 mg by mouth 3 (three) times daily as needed. [provider] Taking Active Self  PRECISION QID TEST test strip [id number] No  [provider] Taking Active Self  pregabalin (LYRICA) 200 MG capsule [id number] No Take 1 capsule (200 mg total) by mouth 2 (  two) times daily. Phineas Semen, MD Taking Active Self  rosuvastatin (CRESTOR) 10 MG tablet [id number] No Take 1 tablet (10 mg total) by mouth daily. Debbe Odea, MD Taking Active Self  Semaglutide,0.25 or 0.5MG /DOS, 2 MG/3ML SOPN [id number]  Inject 0.25 mg into the skin once a week. Alison Finzel, MD  Active   tacrolimus (PROGRAF) 1 MG capsule [id number] No Take 1 capsule by mouth every morning. [provider] Taking Active Self  UBRELVY 50 MG TABS [id number] No Take 1 tablet by mouth daily as needed. [provider] Taking Active Self  venlafaxine XR (EFFEXOR-XR) 75 MG 24 hr capsule  [id number] No Take 1 capsule (75 mg total) by mouth daily with breakfast. Take along with 37.5 mg daily [name], Levin Bacon, MD Taking Active Self  Xylitol (XYLIMELTS) 550 MG DISK [id number] No Take by mouth. [provider] Taking Active Self           Patient Active Problem List   Diagnosis Date Noted   Weakness 10/16/2023   Flu 10/16/2023   SIRS (systemic inflammatory response syndrome) (HCC) 10/15/2023   Elevated serum creatinine 10/15/2023   Myalgia 10/15/2023   Elevated LFTs 10/15/2023   Acute idiopathic pericarditis 03/09/2023   Acute pericarditis 03/08/2023   Sepsis (HCC) 03/06/2023   Chest pain 03/06/2023   CKD (chronic kidney disease), stage II 01/22/2023   Insulin dependent type 2 diabetes mellitus (HCC) 01/22/2023   Overactive bladder 01/22/2023   High risk medication use 12/12/2022   Sjogren's syndrome (HCC) 05/27/2022   Menorrhagia with irregular cycle    Akathisia 10/24/2021   At risk for prolonged QT interval syndrome 09/12/2021   Prolonged Q-T interval on ECG 08/29/2021   Insomnia 07/20/2021   Bipolar disorder, in full remission, most recent episode mixed (HCC) 07/20/2021   GAD (generalized anxiety disorder) 05/09/2021   Opioid dependence with opioid-induced disorder (HCC) 12/20/2020   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   Carpal tunnel syndrome, left 07/26/2019   Cubital tunnel syndrome on left 07/26/2019   Umbilical hernia without obstruction and without gangrene 06/17/2019   Lumbar spondylosis 06/02/2019   Obesity (BMI 35.0-39.9 without comorbidity) 06/02/2019   Goals of care, counseling/discussion 05/18/2019   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   Fibromyalgia 03/23/2019   Systemic lupus erythematosus (SLE) in adult (HCC) 03/23/2019   Essential hypertension 03/23/2019   Mass of left kidney 03/23/2019   Nausea without vomiting 03/23/2019   Hyperlipidemia 03/23/2019   Osteoarthritis 03/23/2019   Trigeminal neuralgia  03/23/2019   Nephrolithiasis 03/23/2019   Migraine 03/23/2019   OSA on CPAP 03/23/2019   Fibrocystic breast 03/23/2019   Heart murmur 03/23/2019   Major depressive disorder, recurrent (HCC) 03/18/2017   Morbid obesity (HCC) 03/18/2017   Neuropathic pain 11/19/2016   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HCC) 01/13/2015   Lumbar disc disease with radiculopathy 07/26/2013   S/P liver transplant (HCC) 07/26/2013   Facial nerve disorder 06/29/2012   Low back pain 12/26/2010   Conditions to be addressed/monitored per PCP order:  Chronic healthcare management needs, HTN, DM, chronic pain, anxiety/depression/BP/PTSD/OSD, migraines, DLBCL, HLD, fibromyalgia, osteoarthritis, h/o liver transplant  Care Plan : Chronic Pain (Adult)  Updates made by Alison Chandler, RN since 12/08/2023 12:00 AM     Problem: Chronic Pain Management-fibromyalgia   Priority: High  Onset Date: 01/22/2021     Long-Range Goal: Fibromyalgia pain managed-new pain management provider   Start Date: 08/14/2020  Expected End Date: 02/05/2024  Recent Progress: On track  Priority: High  Note:   Current Barriers:  Knowledge deficits related to pain management  12/08/23: No complaints today.  Patient to follow up on dentures-to check with dental office and MM regarding approval.  Nutrition  referral placed by ENDO-to get Dexcom-medications adjusted.  Blood sugars 200s per patient.  BP stable. Recent ONC appt-f/u in 1 year.   Nurse Case Manager Clinical Goal(s):  Over the next 45 days, patient will work with [facility] to address needs related to referral for pain management provider and associated care coordination needs. 05/24/22:  patient is currently seeing Dr. Welton Flakes at [facility].  Interventions:  Inter-disciplinary care team collaboration (see longitudinal plan of care) Evaluation of current treatment plan related to fibromyalgia  and patient's adherence to plan as established by provider. Update 06/15/21:   Patient taking Baclofen again, Effexor added. Collaborated with primary care provider regarding recommendations and referral to pain management provider. Discussed plans with patient for ongoing care management follow up and provided patient with direct contact information for care management team Anticipate pain education program, pain management support as part of pain management referral.       Patient Goals/Self-Care Activities Over the next 45 days, patient will:  -Attends all scheduled provider appointments  develop a personal pain management plan with your pain management provider. Patient will f/u with Dr. Welton Flakes and insurance regarding auth.   Follow Up Plan:  The Managed Medicaid care management team will reach out to the patient again over the next 45 business days.    Follow Up:  Patient agrees to Care Plan and Follow-up.  Plan: The Managed Medicaid care management team will reach out to the patient again over the next 45 business  days. and The  Patient has been provided with contact information for the Managed Medicaid care management team and has been advised to call with any health related questions or concerns.  Date/time of next scheduled RN care management/care coordination outreach: 01/22/24 at 230

## 2023-12-09 ENCOUNTER — Other Ambulatory Visit: Payer: Self-pay | Admitting: Nurse Practitioner

## 2023-12-09 DIAGNOSIS — G43119 Migraine with aura, intractable, without status migrainosus: Secondary | ICD-10-CM | POA: Diagnosis not present

## 2023-12-09 DIAGNOSIS — T43505A Adverse effect of unspecified antipsychotics and neuroleptics, initial encounter: Secondary | ICD-10-CM | POA: Diagnosis not present

## 2023-12-09 DIAGNOSIS — G5623 Lesion of ulnar nerve, bilateral upper limbs: Secondary | ICD-10-CM | POA: Diagnosis not present

## 2023-12-09 DIAGNOSIS — G2111 Neuroleptic induced parkinsonism: Secondary | ICD-10-CM | POA: Diagnosis not present

## 2023-12-09 DIAGNOSIS — E1165 Type 2 diabetes mellitus with hyperglycemia: Secondary | ICD-10-CM

## 2023-12-09 DIAGNOSIS — I10 Essential (primary) hypertension: Secondary | ICD-10-CM

## 2023-12-09 DIAGNOSIS — M25552 Pain in left hip: Secondary | ICD-10-CM | POA: Diagnosis not present

## 2023-12-09 DIAGNOSIS — M797 Fibromyalgia: Secondary | ICD-10-CM | POA: Diagnosis not present

## 2023-12-09 DIAGNOSIS — N184 Chronic kidney disease, stage 4 (severe): Secondary | ICD-10-CM

## 2023-12-09 DIAGNOSIS — M25551 Pain in right hip: Secondary | ICD-10-CM | POA: Diagnosis not present

## 2023-12-09 NOTE — Telephone Encounter (Signed)
 Refused Lisinopril and metformin because these are duplicate requests.   Both sent in 12/02/2023 to [facility].

## 2023-12-10 NOTE — Telephone Encounter (Signed)
 Refilled 12/02/23. Requested Prescriptions  Refused Prescriptions Disp Refills   metFORMIN (GLUCOPHAGE) 1000 MG tablet [Pharmacy Med Name: METFORMIN 1000MG  TABLET 1000 Tablet] 60 tablet 10    Sig: TAKE ONE (1) TABLET BY MOUTH TWICE DAILY WITH MEALS     Endocrinology:  Diabetes - Biguanides Failed - 12/10/2023  1:58 PM      Failed - HBA1C is between 0 and 7.9 and within 180 days    Hemoglobin A1C  Date Value Ref Range Status  03/06/2022 6.4  Final   Hgb A1c MFr Bld  Date Value Ref Range Status  11/13/2023 11.8 (H) <5.7 % of total Hgb Final    Comment:    For someone without known diabetes, a hemoglobin A1c value of 6.5% or greater indicates that they may have  diabetes and this should be confirmed with a follow-up  test. . For someone with known diabetes, a value <7% indicates  that their diabetes is well controlled and a value  greater than or equal to 7% indicates suboptimal  control. A1c targets should be individualized based on  duration of diabetes, age, comorbid conditions, and  other considerations. . Currently, no consensus exists regarding use of hemoglobin A1c for diagnosis of diabetes for children. .          Failed - B12 Level in normal range and within 720 days    No results found for: "VITAMINB12"       Failed - CBC within normal limits and completed in the last 12 months    WBC  Date Value Ref Range Status  12/05/2023 5.9 4.0 - 10.5 K/uL Final   RBC  Date Value Ref Range Status  12/05/2023 4.50 3.87 - 5.11 MIL/uL Final   Hemoglobin  Date Value Ref Range Status  12/05/2023 13.1 12.0 - 15.0 g/dL Final  29/56/2130 86.5 11.1 - 15.9 g/dL Final   HCT  Date Value Ref Range Status  12/05/2023 38.2 36.0 - 46.0 % Final   Hematocrit  Date Value Ref Range Status  10/16/2021 44.0 34.0 - 46.6 % Final   MCHC  Date Value Ref Range Status  12/05/2023 34.3 30.0 - 36.0 g/dL Final   [facility]  Date Value Ref Range Status  12/05/2023 29.1 26.0 - 34.0 pg Final   MCV   Date Value Ref Range Status  12/05/2023 84.9 80.0 - 100.0 fL Final  10/16/2021 88 79 - 97 fL Final   No results found for: "PLTCOUNTKUC", "LABPLAT", "POCPLA" RDW  Date Value Ref Range Status  12/05/2023 13.5 11.5 - 15.5 % Final  10/16/2021 13.7 11.7 - 15.4 % Final         Passed - Cr in normal range and within 360 days    Creat  Date Value Ref Range Status  11/13/2023 1.01 0.50 - 1.03 mg/dL Final   Creatinine, Urine  Date Value Ref Range Status  11/13/2023 75 20 - 275 mg/dL Final         Passed - eGFR in normal range and within 360 days    GFR, Est African American  Date Value Ref Range Status  12/11/2020 74 > OR = 60 mL/min/1.2m2 Final   GFR, Est Non African American  Date Value Ref Range Status  12/11/2020 64 > OR = 60 mL/min/1.72m2 Final   GFR, Estimated  Date Value Ref Range Status  10/16/2023 >60 >60 mL/min Final    Comment:    (NOTE) Calculated using the CKD-EPI Creatinine Equation (2021)    eGFR  Date  Value Ref Range Status  05/27/2022 29 (L) > OR = 60 mL/min/1.53m2 Final         Passed - Valid encounter within last 6 months    Recent Outpatient Visits           3 months ago Viral upper respiratory tract infection   [place] [facility] Della Goo F, FNP   8 months ago Sepsis without acute organ dysfunction, due to unspecified organism [facility])   [facility] [name], FNP   10 months ago Essential hypertension   [facility] [name], FNP   1 year ago Essential hypertension   [place] [facility] [name], FNP   1 year ago Essential hypertension   [place] [facility] [name], FNP               lisinopril (ZESTRIL) 20 MG tablet [Pharmacy Med Name: LISINOPRIL 20 MG TABLET 20 Tablet] 30 tablet 10    Sig: TAKE 1 TABLET BY MOUTH ONCE DAILY     Cardiovascular:  ACE Inhibitors Passed - 12/10/2023  1:58 PM       Passed - Cr in normal range and within 180 days    Creat  Date Value Ref Range Status  11/13/2023 1.01 0.50 - 1.03 mg/dL Final   Creatinine, Urine  Date Value Ref Range Status  11/13/2023 75 20 - 275 mg/dL Final         Passed - K in normal range and within 180 days    Potassium  Date Value Ref Range Status  11/13/2023 4.7 3.5 - 5.3 mmol/L Final         Passed - Patient is not pregnant      Passed - Last BP in normal range    BP Readings from Last 1 Encounters:  12/05/23 131/80         Passed - Valid encounter within last 6 months    Recent Outpatient Visits           3 months ago Viral upper respiratory tract infection   [facility] [name], FNP   8 months ago Sepsis without acute organ dysfunction, due to unspecified organism [facility])   [facility] [name], FNP   10 months ago Essential hypertension   [facility] [name], FNP   1 year ago Essential hypertension   [facility] [name], FNP   1 year ago Essential hypertension   [facility] [name], [place]

## 2023-12-11 DIAGNOSIS — F411 Generalized anxiety disorder: Secondary | ICD-10-CM | POA: Diagnosis not present

## 2023-12-11 DIAGNOSIS — F4310 Post-traumatic stress disorder, unspecified: Secondary | ICD-10-CM | POA: Diagnosis not present

## 2023-12-16 ENCOUNTER — Ambulatory Visit: Payer: Medicaid Other | Admitting: Student

## 2023-12-17 NOTE — Therapy (Signed)
 OUTPATIENT PHYSICAL THERAPY LOWER EXTREMITY EVALUATION   Patient Name: Alison [name] MRN: [id number] DOB:05/06/1976, 48 y.o., female Today's Date: 12/18/2023  END OF SESSION:  PT End of Session - 12/18/23 0923     Visit Number 1    Number of Visits 16    Date for PT Re-Evaluation 02/12/24    PT Start Time 0845    PT Stop Time 0922    PT Time Calculation (min) 37 min    Equipment Utilized During Treatment Gait belt    Activity Tolerance Patient limited by fatigue    Behavior During Therapy WFL for tasks assessed/performed             Past Medical History:  Diagnosis Date   Abnormal uterine bleeding    Allergy    Anxiety    Arthritis    Bipolar disorder (manic depression) (HCC)    Chronic kidney failure    Chronic renal disease, stage III (HCC)    Diabetes mellitus without complication (HCC)    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HCC) 2015   Right axillary lymph node resected and chemo tx's.   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 02/2023 Echo: EF 55-60%, no rwma, nl RV size/fxn. Mildly dil LA.   Personality disorder (HCC)    Pineal gland cyst    Post herpetic neuralgia    PTSD (post-traumatic stress disorder)    Renal disorder    S/P liver transplant (HCC) 1993   Sleep apnea    has not recieved cpap yet-other one was recalled   Past Surgical History:  Procedure Laterality Date   BONE MARROW BIOPSY  01/13/2015   BREAST BIOPSY  12/2014   BREAST BIOPSY  2011   BREAST SURGERY     CHOLECYSTECTOMY     COLONOSCOPY     COLONOSCOPY WITH PROPOFOL N/A  02/28/2022   Procedure: COLONOSCOPY WITH PROPOFOL;  Surgeon: Midge Minium, MD;  Location: [facility];  Service: Endoscopy;  Laterality: N/A;   ESOPHAGOGASTRODUODENOSCOPY     ESOPHAGOGASTRODUODENOSCOPY (EGD) WITH PROPOFOL N/A 01/09/2021   Procedure: ESOPHAGOGASTRODUODENOSCOPY (EGD) WITH PROPOFOL;  Surgeon: Midge Minium, MD;  Location: [facility];  Service: Endoscopy;  Laterality: N/A;   HERNIA REPAIR     x4-all from liver transplant   HYSTEROSCOPY WITH D & C N/A 11/13/2021   Procedure: DILATATION AND CURETTAGE /HYSTEROSCOPY;  Surgeon: Natale Milch, MD;  Location: [facility] ORS;  Service: Gynecology;  Laterality: N/A;   infusaport     LIVER TRANSPLANT  12/17/1991   LUMBAR PUNCTURE     PORT A CATH INJECTION ([facility] HX)     RENAL BIOPSY     tumor removal  2015   cancer right side   Patient Active Problem List   Diagnosis Date Noted   Weakness 10/16/2023   Flu 10/16/2023   SIRS (systemic inflammatory response syndrome) (HCC) 10/15/2023   Elevated serum creatinine 10/15/2023   Myalgia 10/15/2023  Elevated LFTs 10/15/2023   Acute idiopathic pericarditis 03/09/2023   Acute pericarditis 03/08/2023   Sepsis (HCC) 03/06/2023   Chest pain 03/06/2023   CKD (chronic kidney disease), stage II 01/22/2023   Insulin dependent type 2 diabetes mellitus (HCC) 01/22/2023   Overactive bladder 01/22/2023   High risk medication use 12/12/2022   Sjogren's syndrome (HCC) 05/27/2022   Menorrhagia with irregular cycle    Akathisia 10/24/2021   At risk for prolonged QT interval syndrome 09/12/2021   Prolonged Q-T interval on ECG 08/29/2021   Insomnia 07/20/2021   Bipolar disorder, in full remission, most recent episode mixed (HCC) 07/20/2021   GAD (generalized anxiety disorder) 05/09/2021   Opioid dependence with opioid-induced disorder (HCC) 12/20/2020   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   Carpal tunnel syndrome, left 07/26/2019   Cubital tunnel syndrome on left 07/26/2019    Umbilical hernia without obstruction and without gangrene 06/17/2019   Lumbar spondylosis 06/02/2019   Obesity (BMI 35.0-39.9 without comorbidity) 06/02/2019   Goals of care, counseling/discussion 05/18/2019   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   Fibromyalgia 03/23/2019   Systemic lupus erythematosus (SLE) in adult [facility]) 03/23/2019   Essential hypertension 03/23/2019   Mass of left kidney 03/23/2019   Nausea without vomiting 03/23/2019   Hyperlipidemia 03/23/2019   Osteoarthritis 03/23/2019   Trigeminal neuralgia 03/23/2019   Nephrolithiasis 03/23/2019   Migraine 03/23/2019   OSA on CPAP 03/23/2019   Fibrocystic breast 03/23/2019   Heart murmur 03/23/2019   Major depressive disorder, recurrent (HCC) 03/18/2017   Morbid obesity (HCC) 03/18/2017   Neuropathic pain 11/19/2016   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HCC) 01/13/2015   Lumbar disc disease with radiculopathy 07/26/2013   S/P liver transplant (HCC) 07/26/2013   Facial nerve disorder 06/29/2012   Low back pain 12/26/2010    PCP: [name] FNP  REFERRING PROVIDER: Janice Coffin PA  REFERRING DIAG: hip pain, bilateral  THERAPY DIAG:  Pain of both hip joints  Difficulty in walking, not elsewhere classified  Muscle weakness (generalized)  Rationale for Evaluation and Treatment: Rehabilitation  ONSET DATE: 2024  SUBJECTIVE:   SUBJECTIVE STATEMENT: Patient presents for evaluation of bilateral hip pain.  PERTINENT HISTORY: Patient presents for evaluation of bilateral hip pain. She additionally  has PMH of ulnar neuropathy, migraines, trigeminal neuralgia, bilateral resting tremors, fibromyalgia, lupus, lymphoma, gout, depression, bipolar disorder, DM, GERD, anxiety, chronic renal disease stage III, liver transplant (27 years ago for lupoid hepatitis), and chemotherapy.  Patient reports she can walk for a little bit then her hips hurt and then she has to sit for a long period.   Reports it has  been getting worse. When she steps wrong it gets worse.  PAIN:  Are you having pain? Yes: NPRS scale: worst: 8/10, least pain: 0/10 Pain location: deep inside hip region Pain description: pinching pain Aggravating factors: walking, stepping wrong Relieving factors: rest  PRECAUTIONS: Fall  RED FLAGS: None   WEIGHT BEARING RESTRICTIONS: No  FALLS:  Has patient fallen in last 6 months? Yes. Number of falls 2  LIVING ENVIRONMENT: Lives with: lives with their spouse Lives in: House/apartment Stairs:  elevator Has following equipment at home: Single point cane, Environmental consultant - 4 wheeled, Wheelchair (power), Wheelchair (manual), and shower chair  OCCUPATION: disability;   Patient enjoys crafting, beadwork, jewelry, applicate, sewing.   PLOF: Independent with household mobility with device  PATIENT GOALS: to figure out what's triggering the pain, decrease pain  NEXT MD VISIT: patient not sure  OBJECTIVE:  Note: Objective measures were completed at Evaluation unless otherwise noted.  DIAGNOSTIC FINDINGS:   MRI T-Spine with and without contrast 05/18/2018: Moderate multilevel spondylosis and associated disc changes most significantly at T11-12, no abnormal cord signal, as detailed above. This study was compared to the patient's previous one dated 11/19/2017, stable findings.  MRI C-Spine 03/05/2018: Mild spondylosis and associated disc changes of the cervical and imaged thoracic spine, most significantly at T2-3, as detailed above. No intrinsic cord lesion to suggest multiple sclerosis. Left thyroid gland peripherally enhancing nodule recommend further evaluation should this be a new finding, such as with ultrasound.This study was compared to the patient's previous one dated 03/30/2010, mild worsening at C5-6, previous study was limited to sagittal plane only.  MRI Brain 03/05/2018: Incidental note of partial opacification of the right sphenoid sinus. Vein adjacent and lateral to the left  trigeminal nerve, no indentation or impingement, perpendicular to the nerve itself, doubtful clinical significance. This study was compared to the patient's previous one dated 08/31/2014, stable findings.   PATIENT SURVEYS:  LEFS 42  COGNITION: Overall cognitive status:  history of short term memory problems       SENSATION: WFL;   POSTURE: posterior pelvic tilt and flexed trunk   PALPATION: No tenderness to palpation   LOWER EXTREMITY ROM: WFL:  Slight lack of motion in hip extension bilaterally LOWER EXTREMITY MMT:  MMT Right eval Left eval  Hip flexion 4- 4-  Hip extension    Hip abduction 3 3  Hip adduction 3- 3-  Hip internal rotation    Hip external rotation    Knee flexion 3 3  Knee extension 3+ 3+  Ankle dorsiflexion 4- 4-  Ankle plantarflexion 4- 4-  Ankle inversion    Ankle eversion     (Blank rows = not tested)  LOWER EXTREMITY SPECIAL TESTS:  Hip special tests: Luisa Hart (FABER) test: positive , SI compression test: negative, SI distraction test: negative, Hip scouring test: positive , and Anterior hip impingement test: positive   FUNCTIONAL TESTS:  5 times sit to stand: 25.5 seconds with hands on plinth table  10 meter walk test: 8 seconds   GAIT: Distance walked: 40 ft Assistive device utilized: None Level of assistance: CGA Comments: decreased step length                                                                                                                                TREATMENT DATE: 12/18/23   Eval + HEP  PATIENT EDUCATION:  Education details: goals, POC HEP  Person educated: Patient Education method: Programmer, multimedia, Demonstration, Actor cues, and Verbal cues Education comprehension: verbalized understanding, returned demonstration, verbal cues required, tactile cues required, and needs further education  HOME EXERCISE PROGRAM: Access Code: [email or web address] URL: [email or web address] Date: 12/18/2023 Prepared by: Precious Bard  Exercises - Leg Extension  - 1 x daily - 7 x weekly - 2 sets - 10 reps - 5  hold - Seated Hip Adduction Isometrics with Ball  - 1 x daily - 7 x weekly - 2 sets - 10 reps - 5 hold - Seated Hip Abduction with Resistance  - 1 x daily - 7 x weekly - 2 sets - 10 reps - 5 hold  ASSESSMENT:  CLINICAL IMPRESSION: Patient is a 48 y.o. female who was seen today for physical therapy evaluation and treatment for bilateral hip pain.  Patient has significant weakness of bilateral hips and knees that could be exacerbating her pain. She is challenged with five consecutive sit to stands with pain increase. Her gait speed is functional with 10 MWT but she does have deficit with prolonged ambulation. Patient will benefit from skilled physical therapy to reduce pain, improve functional mobility, and improve quality of life.   OBJECTIVE IMPAIRMENTS: Abnormal gait, decreased activity tolerance, decreased endurance, decreased mobility, difficulty walking, decreased strength, hypomobility, impaired perceived functional ability, impaired flexibility, improper body mechanics, postural dysfunction, obesity, and pain.   ACTIVITY LIMITATIONS: bending, standing, squatting, stairs, transfers, locomotion level, and caring for others  PARTICIPATION LIMITATIONS: meal prep, cleaning, laundry, shopping, and community activity  PERSONAL FACTORS: Age, Fitness, Past/current experiences, Time since onset of injury/illness/exacerbation, and 3+ comorbidities: ulnar neuropathy, migraines, trigeminal neuralgia, bilateral resting tremors, fibromyalgia, lupus, lymphoma, gout, depression, bipolar disorder, DM, GERD, anxiety, chronic renal disease stage III, liver transplant (27 years ago for lupoid hepatitis), and chemotherapy.   are also affecting patient's functional outcome.   REHAB POTENTIAL: Good  CLINICAL DECISION MAKING: Evolving/moderate complexity  EVALUATION COMPLEXITY: Moderate   GOALS: Goals reviewed with patient?  Yes  SHORT TERM GOALS: Target date: 01/15/2024   Patient will be independent in home exercise program to improve strength/mobility for better functional independence with ADLs. Baseline: 3/13: HEP given Goal status: INITIAL    LONG TERM GOALS: Target date: 02/12/2024    Patient (< 78 years old) will complete five times sit to stand test in < 10 seconds indicating an increased LE strength and improved balance. Baseline: 25.5 seconds with UE support Goal status: INITIAL  2.  Patient will be able to ambulate 2000 steps and/or >6 minutes prior to pain increase allowing for improved community ambulation  Baseline: 3/13: 1000 steps prior to pain Goal status: INITIAL  3.  Patient will report a worst pain of 3/10 on VAS in bilateral hips to improve tolerance with ADLs and reduced symptoms with activities.  Baseline: 3/13: 8/10  Goal status: INITIAL  4.  Patient will increase lower extremity functional scale to >60/80 to demonstrate improved functional mobility and increased tolerance with ADLs.  Baseline: 3/13: 42 Goal status: INITIAL    PLAN:  PT FREQUENCY: 2x/week  PT DURATION: 8 weeks  PLANNED INTERVENTIONS: 97164- PT Re-evaluation, 97110-Therapeutic exercises, 97530- Therapeutic activity, 97112- Neuromuscular re-education, 97535- Self Care, 65784- Manual therapy, [phone]- Gait training, [phone]- Orthotic Fit/training, [phone]- Splinting, [phone]- Electrical stimulation (unattended), [phone]- Electrical stimulation (manual), 97016- Vasopneumatic device, [id number]- Ultrasound, 40347- Traction (mechanical), Patient/Family education, Balance training, Stair training, Taping, Dry Needling, Joint mobilization, Spinal mobilization, Compression bandaging, Vestibular training, Visual/preceptual remediation/compensation, DME instructions, Wheelchair mobility training, Cryotherapy, and Moist heat  PLAN FOR NEXT SESSION: hip strengthening, try distraction of hips?    Precious Bard, PT 12/18/2023, 9:45 AM

## 2023-12-18 ENCOUNTER — Ambulatory Visit: Attending: Student

## 2023-12-18 ENCOUNTER — Ambulatory Visit

## 2023-12-18 DIAGNOSIS — G5621 Lesion of ulnar nerve, right upper limb: Secondary | ICD-10-CM | POA: Insufficient documentation

## 2023-12-18 DIAGNOSIS — R262 Difficulty in walking, not elsewhere classified: Secondary | ICD-10-CM | POA: Diagnosis not present

## 2023-12-18 DIAGNOSIS — G5622 Lesion of ulnar nerve, left upper limb: Secondary | ICD-10-CM | POA: Insufficient documentation

## 2023-12-18 DIAGNOSIS — M6281 Muscle weakness (generalized): Secondary | ICD-10-CM | POA: Insufficient documentation

## 2023-12-18 DIAGNOSIS — M25552 Pain in left hip: Secondary | ICD-10-CM | POA: Diagnosis not present

## 2023-12-18 DIAGNOSIS — R278 Other lack of coordination: Secondary | ICD-10-CM | POA: Diagnosis not present

## 2023-12-18 DIAGNOSIS — M25551 Pain in right hip: Secondary | ICD-10-CM | POA: Insufficient documentation

## 2023-12-18 NOTE — Therapy (Signed)
 OUTPATIENT OCCUPATIONAL THERAPY NEURO EVALUATION  Patient Name: Alison [name] MRN: [id number] DOB:10-13-1975, 48 y.o., female Today's Date: 12/18/2023  PCP: [name], FNP REFERRING PROVIDER: Janice Coffin, PA-C  END OF SESSION:  OT End of Session - 12/18/23 0846     Visit Number 1    Number of Visits 12    Date for OT Re-Evaluation 01/29/24    Progress Note Due on Visit 10    OT Start Time 0925    OT Stop Time 1010    OT Time Calculation (min) 45 min    Activity Tolerance Patient tolerated treatment well    Behavior During Therapy WFL for tasks assessed/performed            Past Medical History:  Diagnosis Date   Abnormal uterine bleeding    Allergy    Anxiety    Arthritis    Bipolar disorder (manic depression) (HCC)    Chronic kidney failure    Chronic renal disease, stage III (HCC)    Diabetes mellitus without complication (HCC)    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HCC) 2015   Right axillary lymph node resected and chemo tx's.   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 02/2023 Echo: EF 55-60%, no rwma, nl RV size/fxn. Mildly dil LA.   Personality disorder (HCC)    Pineal gland cyst    Post herpetic neuralgia    PTSD (post-traumatic stress disorder)    Renal disorder    S/P liver transplant (HCC) 1993   Sleep apnea    has not recieved cpap yet-other one was recalled   Past Surgical History:  Procedure Laterality Date   BONE MARROW BIOPSY  01/13/2015   BREAST BIOPSY  12/2014   BREAST BIOPSY  2011   BREAST SURGERY     CHOLECYSTECTOMY      COLONOSCOPY     COLONOSCOPY WITH PROPOFOL N/A 02/28/2022   Procedure: COLONOSCOPY WITH PROPOFOL;  Surgeon: Midge Minium, MD;  Location: [facility];  Service: Endoscopy;  Laterality: N/A;   ESOPHAGOGASTRODUODENOSCOPY     ESOPHAGOGASTRODUODENOSCOPY (EGD) WITH PROPOFOL N/A 01/09/2021   Procedure: ESOPHAGOGASTRODUODENOSCOPY (EGD) WITH PROPOFOL;  Surgeon: Midge Minium, MD;  Location: [facility];  Service: Endoscopy;  Laterality: N/A;   HERNIA REPAIR     x4-all from liver transplant   HYSTEROSCOPY WITH D & C N/A 11/13/2021   Procedure: DILATATION AND CURETTAGE /HYSTEROSCOPY;  Surgeon: Natale Milch, MD;  Location: [facility];  Service: Gynecology;  Laterality: N/A;   infusaport     LIVER TRANSPLANT  12/17/1991   LUMBAR PUNCTURE     PORT A CATH INJECTION ([facility] HX)     RENAL BIOPSY     tumor removal  2015   cancer right side   Patient Active Problem List   Diagnosis Date Noted   Weakness 10/16/2023   Flu 10/16/2023   SIRS (systemic inflammatory response syndrome) (HCC) 10/15/2023  Elevated serum creatinine 10/15/2023   Myalgia 10/15/2023   Elevated LFTs 10/15/2023   Acute idiopathic pericarditis 03/09/2023   Acute pericarditis 03/08/2023   Sepsis (HCC) 03/06/2023   Chest pain 03/06/2023   CKD (chronic kidney disease), stage II 01/22/2023   Insulin dependent type 2 diabetes mellitus (HCC) 01/22/2023   Overactive bladder 01/22/2023   High risk medication use 12/12/2022   Sjogren's syndrome (HCC) 05/27/2022   Menorrhagia with irregular cycle    Akathisia 10/24/2021   At risk for prolonged QT interval syndrome 09/12/2021   Prolonged Q-T interval on ECG 08/29/2021   Insomnia 07/20/2021   Bipolar disorder, in full remission, most recent episode mixed (HCC) 07/20/2021   GAD (generalized anxiety disorder) 05/09/2021   Opioid dependence with opioid-induced disorder (HCC) 12/20/2020   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   Carpal tunnel syndrome, left 07/26/2019    Cubital tunnel syndrome on left 07/26/2019   Umbilical hernia without obstruction and without gangrene 06/17/2019   Lumbar spondylosis 06/02/2019   Obesity (BMI 35.0-39.9 without comorbidity) 06/02/2019   Goals of care, counseling/discussion 05/18/2019   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   Fibromyalgia 03/23/2019   Systemic lupus erythematosus (SLE) in adult [facility]) 03/23/2019   Essential hypertension 03/23/2019   Mass of left kidney 03/23/2019   Nausea without vomiting 03/23/2019   Hyperlipidemia 03/23/2019   Osteoarthritis 03/23/2019   Trigeminal neuralgia 03/23/2019   Nephrolithiasis 03/23/2019   Migraine 03/23/2019   OSA on CPAP 03/23/2019   Fibrocystic breast 03/23/2019   Heart murmur 03/23/2019   Major depressive disorder, recurrent (HCC) 03/18/2017   Morbid obesity (HCC) 03/18/2017   Neuropathic pain 11/19/2016   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HCC) 01/13/2015   Lumbar disc disease with radiculopathy 07/26/2013   S/P liver transplant (HCC) 07/26/2013   Facial nerve disorder 06/29/2012   Low back pain 12/26/2010   ONSET DATE: 10/08/2023  REFERRING DIAG: Z61.09 (ICD-10-CM) - Ulnar neuropathy of both upper extremities   THERAPY DIAG:  Ulnar neuropathy at elbow of left upper extremity  Ulnar neuropathy at elbow of right upper extremity  Muscle weakness (generalized)  Rationale for Evaluation and Treatment: Rehabilitation  SUBJECTIVE:  SUBJECTIVE STATEMENT: Pt reports no pain in the elbows, more numbness in the fingers and weakness.   Pt accompanied by: self  PERTINENT HISTORY: Per MEDICAL RECORD NUMBERAssessment & Plan Ulnar Neuropathy Recurrent ulnar neuropathy with numbness, tingling in the last two fingers, muscle atrophy, and hand weakness for one month. Previous ulnar nerve entrapment resolved with exercises. Discussed occupational therapy and orthopedic evaluation for nerve release or injections.  - Refer to occupational therapy for hand  exercises and ulnar nerve management - Refer to [facility]  - Follow up in 4-6 months to monitor progress and reassess if symptoms worsen. Return precautions   PRECAUTIONS: Fall  WEIGHT BEARING RESTRICTIONS: No  PAIN:  Are you having pain? Yes: NPRS scale: No pain in BUEs; 8/10 in hips, but intermittently Pain location: bilat hips Pain description: hips seem to lock up, tweak, very sharp, stabbing Aggravating factors: moving the wrong way or extensive walking Relieving factors: resting, oxycodone  FALLS: Has patient fallen in last 6 months? Yes. Number of falls 2 "serious falls"   LIVING ENVIRONMENT: Lives with: lives with their family, spouse and spouse's brother Lives in: 3rd floor apartment with elevator, 2 bedrooms Stairs: No Has following equipment at home: cane, rollator, power chair for long distance   PLOF: Independent  PATIENT GOALS: "I would like to try to  un-entrap this nerve and gain back strength in my L hand."  OBJECTIVE:  Note: Objective measures were completed at Evaluation unless otherwise noted.  HAND DOMINANCE: Right  ADLs: Overall ADLs: BUE weakness and numbness contribute to increased difficulty with [facility] components of ADL/IADLs Transfers/ambulation related to ADLs: modified indep-indep Eating: increased difficulty with cutting vegetables  Grooming: indep UB Dressing: indep LB Dressing: indep Toileting: indep Bathing: modified indep Tub Shower transfers: modified indep with shower chair Equipment: Shower seat with back  IADLs: Shopping: modified indep  Light housekeeping: indep Meal Prep: difficulty opening jars/containers, difficulty with prolonged standing; brother-in-law typically does the meal prep Community mobility: power chair for long distances (ie the zoo), occasional use of cane, occasional amb without AD; keeps a cane in each vehicle Medication management: modified indep (bubble packs from pharmacy) Financial management: shared task  between spouse and pt Handwriting: 100% legible  MOBILITY STATUS:  see above  POSTURE COMMENTS:  Sitting balance:  WNL  ACTIVITY TOLERANCE: Activity tolerance: WFL for tasks assessed  FUNCTIONAL OUTCOME MEASURES: Eval:  MAM-20 for Musculoskeletal Conditions: 61/80   UPPER EXTREMITY ROM:  BUEs WNL  UPPER EXTREMITY MMT:     MMT Right eval Left eval  Shoulder flexion 4+ 4+  Shoulder abduction 4+ 4+  Shoulder adduction    Shoulder extension    Shoulder internal rotation    Shoulder external rotation    Middle trapezius    Lower trapezius    Elbow flexion 4+ 4+  Elbow extension 4+ 4+  Wrist flexion 5 4  Wrist extension 5 4  Wrist ulnar deviation 5 4+  Wrist radial deviation 4 4+  Wrist pronation 4+ 4  Wrist supination 4+ 4  (Blank rows = not tested)  HAND FUNCTION: Grip strength: Right: 62 lbs; Left: 37 lbs, Lateral pinch: Right: 14 lbs, Left: 9 lbs, and 3 point pinch: Right: 13 lbs, Left: 9 lbs  COORDINATION: 9 Hole Peg test: Right: 26 sec; Left: 28 sec  SENSATION: Light touch: Impaired , numbness/tingling in ulnar nerve distribution of BUEs L>R  EDEMA: No visible edema  MUSCLE TONE: RUE: Within functional limits and LUE: Within functional limits  COGNITION: Overall cognitive status: Within functional limits for tasks assessed  VISION: wears bifocals at baseline  PERCEPTION: Not tested  PRAXIS: WFL  OBSERVATIONS:  Pt pleasant, cooperative, and eager to work towards goals in OT poc.                                                                                             TREATMENT DATE: 12/18/23 Evaluation completed.  PATIENT EDUCATION: Education details: OT role, goals, poc; condition management educ Person educated: Patient Education method: Explanation and Verbal cues Education comprehension: verbalized understanding, further training needed  HOME EXERCISE PROGRAM: Initiated pink theraputty exercises; further training   GOALS: Goals reviewed  with patient? Yes  SHORT TERM GOALS: Target date: 01/08/24  Pt will be indep to perform HEP for improving L hand strength for daily tasks. Baseline: Eval: Initiated theraputty exercises at eval Goal status: INITIAL  2.  Pt will be indep to verbalize 2-3 joint protection/cumulative trauma prevention strategies  to reduce paresthesias in BUEs.  Baseline: Eval: Initiated educ on UE positioning during sleep/rest; further training needed Goal status: INITIAL  LONG TERM GOALS: Target date: 01/29/24  Pt will increase MAM-20 score by 9 or more points to indicate improvement in self perceived performance in [facility] components of daily tasks. Baseline: Eval: 61/80 Goal status: INITIAL  2.  Pt will increase L grip strength by 10 or more lbs in order to improve ability to open new jars/containers. Baseline: Eval: L grip strength 37 lbs (R 62 lbs) Goal status: INITIAL  3.  Pt will increase L lateral pinch strength by 3 or more lbs in order to improve efficiency with clipping R hand fingernails. Baseline: Eval: L 9 lbs (R 14 lbs) Goal status: INITIAL  4.  Pt will increase LUE distal strength by 1/2 muscle grade in order to improve ability to lift and carry heavy ADL supplies. Baseline: Eval: L wrist flex/ext 4/5 (R 5/5), L elbow flex/ext 4/5 (R 5/5) Goal status: INITIAL  ASSESSMENT:  CLINICAL IMPRESSION: Patient is a 48 y.o. female who was seen today for occupational therapy evaluation for functional decline related to bilat ulnar neuropathy.  Pt reports onset to be the beginning of this year, and notes increased weakness in the L hand, with the numbness being more pronounced in the L ulnar nerve distribution from elbow to hand as compared to the R, though pt reports the R hand ulnar digits also tingle.  Pt reports that she has experienced this problem before, but was able to improve the numbness with exercise.  At baseline, pt is modified indep to indep with daily tasks, but reports increased difficulty  with FM components of ADLs and leisure such as opening new containers and bottles, cutting vegetables, grasping large drink pitchers, manipulating coins and pills, using nail clippers, and crafting.  Pt's goal is to increase LUE strength and reduce paresthesias in BUEs in order to improve performance with above noted tasks.  Pt will benefit from skilled OT to address above noted deficits and work towards goals in OT poc.  Pt in agreement with plan.  PERFORMANCE DEFICITS: in functional skills including ADLs, IADLs, coordination, dexterity, sensation, strength, Fine motor control, body mechanics, decreased knowledge of precautions, decreased knowledge of use of DME, and UE functional use, cognitive skills including memory, and psychosocial skills including coping strategies, environmental adaptation, habits, and routines and behaviors.   IMPAIRMENTS: are limiting patient from ADLs, IADLs, rest and sleep, and leisure.   CO-MORBIDITIES: has co-morbidities such as s/p liver transplant, anxiety, depression, bipolar, lymphoma  that affects occupational performance. Patient will benefit from skilled OT to address above impairments and improve overall function.  MODIFICATION OR ASSISTANCE TO COMPLETE EVALUATION: No modification of tasks or assist necessary to complete an evaluation.  OT OCCUPATIONAL PROFILE AND HISTORY: Detailed assessment: Review of records and additional review of physical, cognitive, psychosocial history related to current functional performance.  CLINICAL DECISION MAKING: Moderate - several treatment options, min-mod task modification necessary  REHAB POTENTIAL: Good  EVALUATION COMPLEXITY: Moderate    PLAN:  OT FREQUENCY: 2x/week  OT DURATION: 6 weeks  PLANNED INTERVENTIONS: 97168 OT Re-evaluation, 97535 self care/ADL training, 82956 therapeutic exercise, 97530 therapeutic activity, 97112 neuromuscular re-education, 97140 manual therapy, 97010 moist heat, 97010 cryotherapy,  97034 contrast bath, 97760 Orthotics management and training, 21308 Splinting (initial encounter), passive range of motion, psychosocial skills training, coping strategies training, patient/family education, and DME and/or AE instructions  RECOMMENDED OTHER SERVICES: Pt evaluated by PT this date  to address bilat hip pain  CONSULTED AND AGREED WITH PLAN OF CARE: Patient  PLAN FOR NEXT SESSION: see above  Danelle Earthly, MS, OTR/L   Otis Dials, OT 12/18/2023, 10:36 AM

## 2023-12-22 ENCOUNTER — Ambulatory Visit

## 2023-12-22 ENCOUNTER — Ambulatory Visit: Admitting: Physical Therapy

## 2023-12-23 ENCOUNTER — Encounter: Payer: Self-pay | Admitting: Psychiatry

## 2023-12-23 ENCOUNTER — Telehealth (INDEPENDENT_AMBULATORY_CARE_PROVIDER_SITE_OTHER): Payer: Medicaid Other | Admitting: Psychiatry

## 2023-12-23 DIAGNOSIS — G4701 Insomnia due to medical condition: Secondary | ICD-10-CM

## 2023-12-23 DIAGNOSIS — F3176 Bipolar disorder, in full remission, most recent episode depressed: Secondary | ICD-10-CM | POA: Diagnosis not present

## 2023-12-23 DIAGNOSIS — F4310 Post-traumatic stress disorder, unspecified: Secondary | ICD-10-CM | POA: Diagnosis not present

## 2023-12-23 DIAGNOSIS — F411 Generalized anxiety disorder: Secondary | ICD-10-CM | POA: Diagnosis not present

## 2023-12-23 NOTE — Progress Notes (Signed)
 Virtual Visit via Video Note  I connected with Alison [name] on 12/23/23 at  9:00 AM EDT by a video enabled telemedicine application and verified that I am speaking with the correct person using two identifiers.  Location Provider Location : [facility] Patient Location : Home  Participants: Patient , Provider   I discussed the limitations of evaluation and management by telemedicine and the availability of in person appointments. The patient expressed understanding and agreed to proceed.    I discussed the assessment and treatment plan with the patient. The patient was provided an opportunity to ask questions and all were answered. The patient agreed with the plan and demonstrated an understanding of the instructions.   The patient was advised to call back or seek an in-person evaluation if the symptoms worsen or if the condition fails to improve as anticipated.   BH MD OP Progress Note  12/23/2023 5:44 PM RONNIE MALLETTE  MRN:  [id number]  Chief Complaint:  Chief Complaint  Patient presents with   Follow-up   Depression   Anxiety   Medication Refill   HPI: Alison [name] is a 48 year old Caucasian female, lives in [place], has a history of bipolar disorder, PTSD, medical problems including GI issues, history of liver transplant, B-cell lymphoma, SLE, diabetes mellitus, stage IV chronic kidney disease, OSA, chronic migraine headaches, hyperlipidemia, hypertension was evaluated by telemedicine today.  Bipolar disorder is currently in full remission. She is taking Latuda 20 mg daily for bipolar depression and Cogentin 1 mg as needed for side effects. No depression, significant anxiety, or mood disturbances are reported. 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  She has a history of PTSD and generalized anxiety disorder, managed with venlafaxine 112.5 mg daily and mirtazapine 15 mg at bedtime.  Denies suicidal thoughts, auditory or visual hallucinations, or  paranoia are reported. She is in therapy with a therapist named Allicia, whom she sees every three weeks.  Insomnia and sleep apnea continue to be problematic. She experiences difficulty falling asleep and discomfort from the CPAP mask affects her sleep quality. She takes melatonin 5-10 mg at bedtime to aid sleep. She has not yet seen her cardiologist but received a message indicating it is okay to continue her current medications.  Her appetite is low, but she uses nutritional shakes as needed. 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 She finds this activity beneficial for her mood and plans to continue volunteering at least two days a week.  Visit Diagnosis:    ICD-10-CM   1. Bipolar 1 disorder, depressed, full remission (HCC)  F31.76     2. PTSD (post-traumatic stress disorder)  F43.10     3. GAD (generalized anxiety disorder)  F41.1     4. Insomnia due to medical condition  G47.01    Mood , pain, OSA noncompliant on CPAP      Past Psychiatric History: I have reviewed past psychiatric history from progress note on 06/10/2019.  Past Medical History:  Past Medical History:  Diagnosis Date   Abnormal uterine bleeding    Allergy    Anxiety    Arthritis    Bipolar disorder (manic depression) (HCC)    Chronic kidney failure    Chronic renal disease, stage III (HCC)    Diabetes mellitus without complication (HCC)    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HCC) 2015   Right axillary lymph node resected and chemo tx's.  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 02/2023 Echo: EF 55-60%, no rwma, nl RV size/fxn. Mildly dil LA.   Personality disorder (HCC)    Pineal gland cyst    Post herpetic neuralgia    PTSD (post-traumatic stress disorder)    Renal disorder    S/P liver transplant (HCC) 1993   Sleep apnea    has not recieved cpap yet-other one was recalled    Past Surgical History:  Procedure Laterality Date   BONE MARROW BIOPSY  01/13/2015   BREAST BIOPSY  12/2014   BREAST BIOPSY  2011   BREAST SURGERY     CHOLECYSTECTOMY     COLONOSCOPY     COLONOSCOPY WITH PROPOFOL N/A 02/28/2022   Procedure: COLONOSCOPY WITH PROPOFOL;  Surgeon: Midge Minium, MD;  Location: [facility] ENDOSCOPY;  Service: Endoscopy;  Laterality: N/A;   ESOPHAGOGASTRODUODENOSCOPY     ESOPHAGOGASTRODUODENOSCOPY (EGD) WITH PROPOFOL N/A 01/09/2021   Procedure: ESOPHAGOGASTRODUODENOSCOPY (EGD) WITH PROPOFOL;  Surgeon: Midge Minium, MD;  Location: [facility] ENDOSCOPY;  Service: Endoscopy;  Laterality: N/A;   HERNIA REPAIR     x4-all from liver transplant   HYSTEROSCOPY WITH D & C N/A 11/13/2021   Procedure: DILATATION AND CURETTAGE /HYSTEROSCOPY;  Surgeon: Natale Milch, MD;  Location: [facility] ORS;  Service: Gynecology;  Laterality: N/A;   infusaport     LIVER TRANSPLANT  12/17/1991   LUMBAR PUNCTURE     PORT A CATH INJECTION ([facility] HX)     RENAL BIOPSY     tumor removal  2015   cancer right side    Family Psychiatric History: I have reviewed family psychiatric history from progress note on 06/10/2019.  Family History:  Family History  Problem Relation Age of Onset   Heart disease Mother    Hypertension Mother    Cancer - Other Mother    Bipolar disorder Mother    Heart disease Father    Hypertension Father    Diabetes Father    Parkinson's disease Maternal Grandmother    Cancer Maternal Aunt    Cancer Maternal Uncle    Cancer Maternal Grandfather    Lupus Paternal Grandmother    Hypertension Brother     Social  History: I have reviewed social history from progress note on 06/10/2019. Social History   Socioeconomic History   Marital status: Single    Spouse name: Not on file   Number of children: 0   Years of education: 9   Highest education level: GED or equivalent  Occupational History   Occupation: disabled  Tobacco Use   Smoking status: Never    Passive exposure: Past   Smokeless tobacco: Never  Vaping Use   Vaping status: Never Used  Substance and Sexual Activity   Alcohol use: Not Currently   Drug use: Not Currently    Comment: pain managment last used in early april   Sexual activity: Not Currently  Other Topics Concern  Not on file  Social History Narrative   Not on file   Social Drivers of Health   Financial Resource Strain: Low Risk  (10/07/2023)   Overall Financial Resource Strain (CARDIA)    Difficulty of Paying Living Expenses: Not very hard  Food Insecurity: No Food Insecurity (08/05/2023)   Hunger Vital Sign    Worried About Running Out of Food in the Last Year: Never true    Ran Out of Food in the Last Year: Never true  Transportation Needs: No Transportation Needs (09/05/2023)   PRAPARE - Administrator, Civil Service (Medical): No    Lack of Transportation (Non-Medical): No  Physical Activity: Insufficiently Active (11/07/2023)   Exercise Vital Sign    Days of Exercise per Week: 3 days    Minutes of Exercise per Session: 10 min  Stress: No Stress Concern Present (11/07/2023)   [employer] of Occupational Health - Occupational Stress Questionnaire    Feeling of Stress : Only a little  Social Connections: Moderately Integrated (06/05/2023)   Social Connection and Isolation Panel [NHANES]    Frequency of Communication with Friends and Family: More than three times a week    Frequency of Social Gatherings with Friends and Family: More than three times a week    Attends Religious Services: More than 4 times per year    Active Member of [employer] or  Organizations: No    Attends Banker Meetings: Never    Marital Status: Living with partner    Allergies:  Allergies  Allergen Reactions   Penicillin G Itching, Rash, Anaphylaxis and Palpitations   Lamictal [Lamotrigine] Rash   Tape    Carbamazepine Other (See Comments)    Medication interaction-prograf    Hydrocodone-Acetaminophen Itching   Naproxen Itching   Penicillins    Vancomycin Rash    Macular/blotchy rash at infusion site    Metabolic Disorder Labs: Lab Results  Component Value Date   HGBA1C 11.8 (H) 11/13/2023   MPG 292 11/13/2023   MPG 263.26 10/16/2023   Lab Results  Component Value Date   PROLACTIN 16.4 12/12/2022   PROLACTIN 13.4 07/15/2019   Lab Results  Component Value Date   CHOL 119 11/13/2023   TRIG 303 (H) 11/13/2023   HDL 37 (L) 11/13/2023   CHOLHDL 3.2 11/13/2023   VLDL 17 03/08/2023   LDLCALC 50 11/13/2023   LDLCALC 19 03/08/2023   Lab Results  Component Value Date   TSH 4.878 (H) 03/09/2023   TSH 2.650 07/15/2019    Therapeutic Level Labs: No results found for: "LITHIUM" No results found for: "VALPROATE" No results found for: "CBMZ"  Current Medications: Current Outpatient Medications  Medication Sig Dispense Refill   venlafaxine XR (EFFEXOR-XR) 37.5 MG 24 hr capsule Take 37.5 mg by mouth every morning.     Accu-Chek Softclix Lancets lancets 2 (two) times daily.     albuterol (VENTOLIN HFA) 108 (90 Base) MCG/ACT inhaler Inhale 2 puffs into the lungs every 6 (six) hours as needed for wheezing or shortness of breath. 8 g 0   baclofen (LIORESAL) 10 MG tablet Take 1 tablet by mouth 2 (two) times daily.     benzonatate (TESSALON) 100 MG capsule Take 2 capsules (200 mg total) by mouth 2 (two) times daily as needed for cough. 20 capsule 0   benztropine (COGENTIN) 1 MG tablet TAKE 1 TABLET BY MOUTH ONCE DAILY AS NEEDED FOR TREMORS 30 tablet 10   carvedilol (COREG) 3.125 MG tablet  Cholecalciferol (VITAMIN D) 125 MCG  (5000 UT) CAPS Take 5,000 Units by mouth daily.     Continuous Glucose Sensor (DEXCOM G7 SENSOR) MISC 1 Device by Does not apply route continuous. 9 each 3   Cranberry-Vitamin C-Probiotic (AZO CRANBERRY PO) Take by mouth. (Patient not taking: Reported on 11/17/2023)     ezetimibe (ZETIA) 10 MG tablet Take 10 mg by mouth every morning.     fluticasone (FLONASE) 50 MCG/ACT nasal spray Place into the nose.     glipiZIDE (GLUCOTROL XL) 10 MG 24 hr tablet Take 1 tablet (10 mg total) by mouth in the morning and at bedtime. 60 tablet 5   ibuprofen (ADVIL) 400 MG tablet Take 1 tablet (400 mg total) by mouth every 6 (six) hours as needed. 30 tablet 0   Insulin Glargine (BASAGLAR KWIKPEN) 100 UNIT/ML Inject 40 Units into the skin at bedtime.     Insulin Pen Needle 32G X 6 MM MISC 1 each by Does not apply route as needed. 100 each 5   lisinopril (ZESTRIL) 20 MG tablet TAKE 1 TABLET BY MOUTH ONCE DAILY 90 tablet 0   lurasidone (LATUDA) 20 MG TABS tablet TAKE 1 TABLET BY MOUTH ONCE DAILY WITH SUPPER *REFILL REQUEST* 90 tablet 10   magnesium oxide (MAG-OX) 400 MG tablet Take 1,000 mg by mouth 2 (two) times daily.     Melatonin 10 MG TABS Take 10 mg by mouth at bedtime.     metFORMIN (GLUCOPHAGE) 1000 MG tablet TAKE ONE (1) TABLET BY MOUTH TWICE DAILY WITH MEALS 180 tablet 0   mirabegron ER (MYRBETRIQ) 50 MG TB24 tablet Take 1 tablet (50 mg total) by mouth daily. 90 tablet 3   mirtazapine (REMERON) 15 MG tablet TAKE 1 TABLET BY MOUTH EVERY DAY AT BEDTIME *REFILL REQUEST* 90 tablet 10   naloxone (NARCAN) nasal spray 4 mg/0.1 mL Place 0.4 mg into the nose once.     NOVOLOG FLEXPEN 100 UNIT/ML FlexPen Inject 0-30 Units into the skin 3 (three) times daily with meals. Sliding scale     ondansetron (ZOFRAN) 4 MG tablet Take 1 tablet (4 mg total) by mouth every 6 (six) hours as needed for nausea. 20 tablet 0   oxyCODONE (OXY IR/ROXICODONE) 5 MG immediate release tablet Take 5 mg by mouth 3 (three) times daily as needed.      PRECISION QID TEST test strip      pregabalin (LYRICA) 200 MG capsule Take 1 capsule (200 mg total) by mouth 2 (two) times daily. 60 capsule 0   rosuvastatin (CRESTOR) 10 MG tablet Take 1 tablet (10 mg total) by mouth daily. 90 tablet 2   Semaglutide,0.25 or 0.5MG /DOS, 2 MG/3ML SOPN Inject 0.25 mg into the skin once a week. 3 mL 0   tacrolimus (PROGRAF) 1 MG capsule Take 1 capsule by mouth every morning.     UBRELVY 50 MG TABS Take 1 tablet by mouth daily as needed.     venlafaxine XR (EFFEXOR-XR) 75 MG 24 hr capsule Take 1 capsule (75 mg total) by mouth daily with breakfast. Take along with 37.5 mg daily 30 capsule 10   Xylitol (XYLIMELTS) 550 MG DISK Take by mouth.     No current facility-administered medications for this visit.     Musculoskeletal: Strength & Muscle Tone:  UTA Gait & Station:  Seated Patient leans: N/A  Psychiatric Specialty Exam: Review of Systems  Psychiatric/Behavioral:  Positive for sleep disturbance.     Last menstrual period 10/16/2021.There is no height  or weight on file to calculate BMI.  General Appearance: Casual  Eye Contact:  Fair  Speech:  Clear and Coherent  Volume:  Normal  Mood:  Euthymic  Affect:  Congruent  Thought Process:  Goal Directed and Descriptions of Associations: Intact  Orientation:  Full (Time, Place, and Person)  Thought Content: Logical   Suicidal Thoughts:  No  Homicidal Thoughts:  No  Memory:  Immediate;   Fair Recent;   Fair Remote;   Fair  Judgement:  Fair  Insight:  Fair  Psychomotor Activity:  Normal  Concentration:  Concentration: Fair and Attention Span: Fair  Recall:  [employer] of Knowledge: Fair  Language: Fair  Akathisia:  No  Handed:  Right  AIMS (if indicated): not done  Assets:  Desire for Improvement Housing Social Support  ADL's:  Intact  Cognition: WNL  Sleep:  Poor   Screenings: Geneticist, molecular Office Visit from 11/04/2023 in [facility]  Office Visit from 07/21/2023 in [facility] Office Visit from 12/12/2022 in [facility] Office Visit from 09/12/2022 in [facility] Office Visit from 07/08/2022 in [facility]  AIMS Total Score 0 0 0 0 0      GAD-7    Flowsheet Row Office Visit from 07/21/2023 in [facility] Office Visit from 03/19/2023 in [facility] Video Visit from 03/17/2023 in [facility] Office Visit from 01/22/2023 in [facility] Office Visit from 12/12/2022 in [facility]  Total GAD-7 Score 8 2 14 8 7       PHQ2-9    Flowsheet Row Office Visit from 11/04/2023 in [facility] Office Visit from 09/08/2023 in [facility] Office Visit from 07/21/2023 in [facility] Office Visit from 03/19/2023 in [facility] Video Visit from 03/17/2023 in [facility]  PHQ-2 Total Score 1 0 2 2 0  PHQ-9 Total Score -- -- 10 6 --      Flowsheet Row Video Visit from 12/23/2023 in [facility] Office Visit from 11/04/2023 in [facility] ED from 10/15/2023 in [facility] at [facility]  C-SSRS RISK CATEGORY Moderate Risk No Risk No Risk        Assessment and Plan: BATUL DIEGO is a 48 year old Caucasian female on disability, has a history of bipolar disorder, PTSD, multiple medical problems was evaluated by telemedicine today.  Discussed assessment and plan as noted below.  Insomnia-unstable Difficulty falling asleep due to CPAP mask  discomfort, affecting next-day functioning. Insomnia management is contingent on addressing sleep apnea. Considering Inspire surgery, pending cardiologist clearance due to potential cardiac issues. - Consult pulmonologist or CPAP specialist regarding CPAP mask discomfort - Consider Inspire surgery for sleep apnea after cardiologist clearance  Sleep Apnea-unstable Contributing to insomnia due to CPAP mask discomfort. Considering Inspire surgery, requiring cardiologist clearance. Coordination with cardiologist and pulmonologist is necessary. - Consult pulmonologist or CPAP specialist regarding CPAP mask discomfort - Consider Inspire surgery for sleep apnea after cardiologist clearance  Post-Traumatic Stress Disorder (PTSD) and Generalized Anxiety Disorder-stable Managed on current medication regimen. No significant anxiety or mood disturbances reported. Engaged in therapy. Mirtazapine aids sleep. - Continue Venlafaxine  112.5 mg daily - Continue Mirtazapine 15 mg at bedtime - Continue therapy with Jill Side  Bipolar Disorder in remission In full remission. Managed with Latuda 20 mg daily and therapy. No depressive or manic symptoms reported. - Continue Latuda 20 mg daily - Continue Benztropine 1 mg daily as needed for side effects of Latuda. 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  Corrected QT is 427 which is normal.(10/20/2023) - Continue therapy with Jill Side  Follow-up Advised to follow up with cardiologist and pulmonologist for sleep apnea management and potential surgery. Next psychiatric follow-up in three months. - Schedule follow-up appointment on June 5 at 8:30 AM in the office - Coordinate with cardiologist and pulmonologist regarding sleep apnea management   Collaboration of Care: Collaboration of Care: Referral or follow-up with counselor/therapist AEB encouraged to continue CBT and follow-up with cardiology  Patient/Guardian was advised Release of  Information must be obtained prior to any record release in order to collaborate their care with an outside provider. Patient/Guardian was advised if they have not already done so to contact the registration department to sign all necessary forms in order for [place] to release information regarding their care.   Consent: Patient/Guardian gives verbal consent for treatment and assignment of benefits for services provided during this visit. Patient/Guardian expressed understanding and agreed to proceed.  Discussed the use of a AI scribe software for clinical note transcription with the patient, who gave verbal consent to proceed.  This note was generated in part or whole with voice recognition software. Voice recognition is usually quite accurate but there are transcription errors that can and very often do occur. I apologize for any typographical errors that were not detected and corrected.     [name], MD 12/23/2023, 5:44 PM

## 2023-12-24 ENCOUNTER — Ambulatory Visit: Admitting: Occupational Therapy

## 2023-12-24 ENCOUNTER — Ambulatory Visit: Admitting: Physical Therapy

## 2023-12-29 ENCOUNTER — Encounter: Payer: Medicaid Other | Attending: "Endocrinology | Admitting: Dietician

## 2023-12-29 DIAGNOSIS — E1165 Type 2 diabetes mellitus with hyperglycemia: Secondary | ICD-10-CM | POA: Insufficient documentation

## 2023-12-29 DIAGNOSIS — Z713 Dietary counseling and surveillance: Secondary | ICD-10-CM | POA: Insufficient documentation

## 2023-12-29 NOTE — Progress Notes (Signed)
 Diabetes Self-Management Education  Visit Type: First/Initial  Appt. Start Time: 1415 Appt. End Time: 1524  12/29/2023  [name], identified by name and date of birth, is a 48 y.o. female with a diagnosis of Diabetes: Type 2.   ASSESSMENT  Patient is here today alone. Patient would like to learn more about vegetarian meal planning. Patient lives with husband and her borther in Social worker. Pt reports brother in law does the shopping and shared shopping. Pt reports her largest challenge is "I only eat when I'm hungry". Pt reports she is following a [employer] Vegetarian dietary pattern and states she is currently considering adding chicken. Pt denies CGM stating she never received this device. Pt reports missed insulin doses of Basaglar 3-4 times weekly stating she forgets. Pt reports Novo log is missed every other day stating she uses a sliding scale if her blood sugars are over 250 mg/dL. 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 Pt reports she is testing her blood sugar "3 times weekly" and was encouraged to increase to before meals and bedtime. Pt reports hypoglycemia treatment with a teaspoon of honey and was encouraged to carry a fast acting sugar. All Pt's questions were answered during this encounter.    History includes:   Past Medical History:  Diagnosis Date   Abnormal uterine bleeding    Allergy    Anxiety    Arthritis    Bipolar disorder (manic depression) (HCC)    Chronic kidney failure    Chronic renal disease, stage III (HCC)    Diabetes mellitus without complication (HCC)    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HCC) 2015   Right axillary lymph node resected and chemo tx's.   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 02/2023 Echo: EF 55-60%, no rwma, nl RV size/fxn. Mildly dil LA.   Personality disorder (HCC)    Pineal gland cyst    Post herpetic neuralgia    PTSD (post-traumatic stress disorder)    Renal disorder    S/P liver transplant (HCC) 1993   Sleep apnea    has not recieved cpap yet-other one was recalled    Labs noted:   Lab Results  Component Value Date   HGBA1C 11.8 (H) 11/13/2023   Lab Results  Component Value Date   CHOL 119 11/13/2023   HDL 37 (L) 11/13/2023   LDLCALC 50 11/13/2023   TRIG 303 (H) 11/13/2023   CHOLHDL 3.2 11/13/2023   Last vitamin D No results found for: "25OHVITD2", "25OHVITD3", "VD25OH"   Medications include:   Current Outpatient Medications:    Accu-Chek Softclix Lancets lancets, 2 (two) times daily., Disp: , Rfl:    glipiZIDE (GLUCOTROL XL) 10 MG 24 hr tablet, Take 1 tablet (10 mg total) by mouth in the morning and at bedtime., Disp: 60 tablet, Rfl: 5   Insulin Glargine (BASAGLAR KWIKPEN) 100 UNIT/ML, Inject 40 Units into  the skin at bedtime., Disp: , Rfl:    Insulin Pen Needle 32G X 6 MM MISC, 1 each by Does not apply route as needed., Disp: 100 each, Rfl: 5   metFORMIN (GLUCOPHAGE) 1000 MG tablet, TAKE ONE (1) TABLET BY MOUTH TWICE DAILY WITH MEALS, Disp: 180 tablet, Rfl: 0   NOVOLOG FLEXPEN 100 UNIT/ML FlexPen, Inject 0-30 Units into the skin 3 (three) times daily with meals. Sliding scale, Disp: , Rfl:    Semaglutide,0.25 or 0.5MG /DOS, 2 MG/3ML SOPN, Inject 0.25 mg into the skin once a week., Disp: 3 mL, Rfl: 0   tacrolimus (PROGRAF) 1 MG capsule, Take 1 capsule by mouth every morning., Disp: , Rfl:    albuterol (VENTOLIN HFA) 108 (90 Base) MCG/ACT inhaler, Inhale 2 puffs into the lungs every 6 (six) hours as needed for wheezing or shortness of breath., Disp: 8 g, Rfl: 0    baclofen (LIORESAL) 10 MG tablet, Take 1 tablet by mouth 2 (two) times daily., Disp: , Rfl:    benzonatate (TESSALON) 100 MG capsule, Take 2 capsules (200 mg total) by mouth 2 (two) times daily as needed for cough., Disp: 20 capsule, Rfl: 0   benztropine (COGENTIN) 1 MG tablet, TAKE 1 TABLET BY MOUTH ONCE DAILY AS NEEDED FOR TREMORS, Disp: 30 tablet, Rfl: 10   carvedilol (COREG) 3.125 MG tablet, , Disp: , Rfl:    Cholecalciferol (VITAMIN D) 125 MCG (5000 UT) CAPS, Take 5,000 Units by mouth daily., Disp: , Rfl:    Continuous Glucose Sensor (DEXCOM G7 SENSOR) MISC, 1 Device by Does not apply route continuous. (Patient not taking: Reported on 12/29/2023), Disp: 9 each, Rfl: 3   Cranberry-Vitamin C-Probiotic (AZO CRANBERRY PO), Take by mouth. (Patient not taking: Reported on 11/17/2023), Disp: , Rfl:    ezetimibe (ZETIA) 10 MG tablet, Take 10 mg by mouth every morning., Disp: , Rfl:    fluticasone (FLONASE) 50 MCG/ACT nasal spray, Place into the nose., Disp: , Rfl:    ibuprofen (ADVIL) 400 MG tablet, Take 1 tablet (400 mg total) by mouth every 6 (six) hours as needed., Disp: 30 tablet, Rfl: 0   lisinopril (ZESTRIL) 20 MG tablet, TAKE 1 TABLET BY MOUTH ONCE DAILY, Disp: 90 tablet, Rfl: 0   lurasidone (LATUDA) 20 MG TABS tablet, TAKE 1 TABLET BY MOUTH ONCE DAILY WITH SUPPER *REFILL REQUEST*, Disp: 90 tablet, Rfl: 10   magnesium oxide (MAG-OX) 400 MG tablet, Take 1,000 mg by mouth 2 (two) times daily., Disp: , Rfl:    Melatonin 10 MG TABS, Take 10 mg by mouth at bedtime., Disp: , Rfl:    mirabegron ER (MYRBETRIQ) 50 MG TB24 tablet, Take 1 tablet (50 mg total) by mouth daily., Disp: 90 tablet, Rfl: 3   mirtazapine (REMERON) 15 MG tablet, TAKE 1 TABLET BY MOUTH EVERY DAY AT BEDTIME *REFILL REQUEST*, Disp: 90 tablet, Rfl: 10   naloxone (NARCAN) nasal spray 4 mg/0.1 mL, Place 0.4 mg into the nose once., Disp: , Rfl:    ondansetron (ZOFRAN) 4 MG tablet, Take 1 tablet (4 mg total) by mouth every 6 (six) hours as  needed for nausea., Disp: 20 tablet, Rfl: 0   oxyCODONE (OXY IR/ROXICODONE) 5 MG immediate release tablet, Take 5 mg by mouth 3 (three) times daily as needed., Disp: , Rfl:    PRECISION QID TEST test strip, , Disp: , Rfl:    pregabalin (LYRICA) 200 MG capsule, Take 1 capsule (200 mg total) by mouth 2 (two) times daily., Disp: 60 capsule, Rfl:  0   rosuvastatin (CRESTOR) 10 MG tablet, Take 1 tablet (10 mg total) by mouth daily., Disp: 90 tablet, Rfl: 2   UBRELVY 50 MG TABS, Take 1 tablet by mouth daily as needed., Disp: , Rfl:    venlafaxine XR (EFFEXOR-XR) 37.5 MG 24 hr capsule, Take 37.5 mg by mouth every morning., Disp: , Rfl:    venlafaxine XR (EFFEXOR-XR) 75 MG 24 hr capsule, Take 1 capsule (75 mg total) by mouth daily with breakfast. Take along with 37.5 mg daily, Disp: 30 capsule, Rfl: 10   Xylitol (XYLIMELTS) 550 MG DISK, Take by mouth., Disp: , Rfl:    Wt Readings from Last 3 Encounters:  12/05/23 252 lb 4.8 oz (114.4 kg)  11/17/23 253 lb (114.8 kg)  11/04/23 246 lb 3.2 oz (111.7 kg)    Last menstrual period 10/16/2021. There is no height or weight on file to calculate BMI.   Diabetes Self-Management Education - 12/29/23 1420       Visit Information   Visit Type First/Initial      Initial Visit   Diabetes Type Type 2    Date Diagnosed 2019    Are you currently following a meal plan? No    Are you taking your medications as prescribed? Yes      Health Coping   How would you rate your overall health? Fair      Psychosocial Assessment   Patient Belief/Attitude about Diabetes Defeat/Burnout    What is the hardest part about your diabetes right now, causing you the most concern, or is the most worrisome to you about your diabetes?   Making healty food and beverage choices    Self-care barriers None    Self-management support Doctor's office    Other persons present Patient    Patient Concerns Nutrition/Meal planning    Special Needs None    Preferred Learning Style No  preference indicated    Learning Readiness Ready    How often do you need to have someone help you when you read instructions, pamphlets, or other written materials from your doctor or pharmacy? 2 - Rarely    What is the last grade level you completed in school? 11th      Pre-Education Assessment   Patient understands the diabetes disease and treatment process. Needs Review    Patient understands incorporating nutritional management into lifestyle. Needs Review    Patient undertands incorporating physical activity into lifestyle. Needs Instruction    Patient understands using medications safely. Needs Review    Patient understands monitoring blood glucose, interpreting and using results Needs Review    Patient understands prevention, detection, and treatment of acute complications. Needs Review    Patient understands prevention, detection, and treatment of chronic complications. Needs Review    Patient understands how to develop strategies to address psychosocial issues. Needs Review    Patient understands how to develop strategies to promote health/change behavior. Needs Review      Complications   Last HgB A1C per patient/outside source 11.2 %    How often do you check your blood sugar? 1-2 times/day    Fasting Blood glucose range (mg/dL) 829-562;>130    Number of hypoglycemic episodes per month 0    Number of hyperglycemic episodes ( >200mg /dL): Daily    Can you tell when your blood sugar is high? Yes    What do you do if your blood sugar is high? tingling sensation    Have you had a dilated eye exam in the  past 12 months? Yes    Have you had a dental exam in the past 12 months? No    Are you checking your feet? Yes    How many days per week are you checking your feet? 3      Dietary Intake   Breakfast skips or great value nutrition shake or 4 packets or flavored instant oatmeal, pepsi    Lunch cheese, mayo, hawiian bread 2 slices, pepsi zero    Dinner rice, tofu, general tso  sauce, pepsi zero or black bean burger, ketchups, mustard, cheese, hamburger bun    Beverage(s) pepsi zero, water, SF kool aid      Activity / Exercise   Activity / Exercise Type ADL's    How many days per week do you exercise? 0    How many minutes per day do you exercise? 0    Total minutes per week of exercise 0      Patient Education   Previous Diabetes Education Yes (please comment)    Disease Pathophysiology Definition of diabetes, type 1 and 2, and the diagnosis of diabetes    Healthy Eating Reviewed blood glucose goals for pre and post meals and how to evaluate the patients' food intake on their blood glucose level.    Being Active Role of exercise on diabetes management, blood pressure control and cardiac health.    Medications Reviewed patients medication for diabetes, action, purpose, timing of dose and side effects.    Monitoring Daily foot exams;Yearly dilated eye exam;Identified appropriate SMBG and/or A1C goals.;Purpose and frequency of SMBG.    Acute complications Taught prevention, symptoms, and  treatment of hypoglycemia - the 15 rule.    Chronic complications Dental care;Assessed and discussed foot care and prevention of foot problems;Identified and discussed with patient  current chronic complications;Relationship between chronic complications and blood glucose control    Diabetes Stress and Support Identified and addressed patients feelings and concerns about diabetes    Preconception care Other (comment)   n/a   Lifestyle and Health Coping Lifestyle issues that need to be addressed for better diabetes care      Individualized Goals (developed by patient)   Nutrition Follow meal plan discussed    Physical Activity Exercise 1-2 times per week;30 minutes per day    Medications take my medication as prescribed    Monitoring  Test my blood glucose as discussed    Problem Solving Eating Pattern    Reducing Risk do foot checks daily;treat hypoglycemia with 15 grams of carbs  if blood glucose less than 70mg /dL    Health Coping Ask for help with psychological, social, or emotional issues      Post-Education Assessment   Patient understands the diabetes disease and treatment process. Needs Review    Patient understands incorporating nutritional management into lifestyle. Needs Review    Patient undertands incorporating physical activity into lifestyle. Needs Review    Patient understands using medications safely. Needs Review    Patient understands monitoring blood glucose, interpreting and using results Needs Review    Patient understands prevention, detection, and treatment of acute complications. Needs Review    Patient understands prevention, detection, and treatment of chronic complications. Needs Review    Patient understands how to develop strategies to address psychosocial issues. Needs Review    Patient understands how to develop strategies to promote health/change behavior. Needs Review      Outcomes   Expected Outcomes Demonstrated interest in learning but significant barriers to change  Future DMSE 3-4 months    Program Status Not Completed             Individualized Plan for Diabetes Self-Management Training:   Learning Objective:  Patient will have a greater understanding of diabetes self-management. Patient education plan is to attend individual and/or group sessions per assessed needs and concerns.   Plan:   Patient Instructions  Increase non starchy vegetables intake Self monitor blood sugar before meals and bedtime   Expected Outcomes:  Demonstrated interest in learning but significant barriers to change  Education material provided: ADA - How to Thrive: A Guide for Your Journey with Diabetes, My Plate, Snack sheet, Support group flyer, and Diabetes Resources  If problems or questions, patient to contact team via:  Phone  Future DSME appointment: 3-4 months

## 2023-12-29 NOTE — Patient Instructions (Signed)
 Increase non starchy vegetables intake Self monitor blood sugar before meals and bedtime

## 2023-12-30 ENCOUNTER — Ambulatory Visit

## 2023-12-30 ENCOUNTER — Other Ambulatory Visit: Payer: Self-pay | Admitting: Nurse Practitioner

## 2023-12-30 ENCOUNTER — Ambulatory Visit: Admitting: Physical Therapy

## 2023-12-30 DIAGNOSIS — E1129 Type 2 diabetes mellitus with other diabetic kidney complication: Secondary | ICD-10-CM | POA: Diagnosis not present

## 2023-12-30 DIAGNOSIS — R809 Proteinuria, unspecified: Secondary | ICD-10-CM | POA: Diagnosis not present

## 2023-12-30 DIAGNOSIS — N182 Chronic kidney disease, stage 2 (mild): Secondary | ICD-10-CM | POA: Diagnosis not present

## 2023-12-30 DIAGNOSIS — E1122 Type 2 diabetes mellitus with diabetic chronic kidney disease: Secondary | ICD-10-CM | POA: Diagnosis not present

## 2023-12-30 DIAGNOSIS — N3281 Overactive bladder: Secondary | ICD-10-CM

## 2023-12-31 DIAGNOSIS — G5623 Lesion of ulnar nerve, bilateral upper limbs: Secondary | ICD-10-CM | POA: Diagnosis not present

## 2024-01-01 ENCOUNTER — Ambulatory Visit: Admitting: Occupational Therapy

## 2024-01-01 ENCOUNTER — Encounter: Payer: Self-pay | Admitting: Physical Therapy

## 2024-01-01 ENCOUNTER — Ambulatory Visit

## 2024-01-01 DIAGNOSIS — M6281 Muscle weakness (generalized): Secondary | ICD-10-CM

## 2024-01-01 DIAGNOSIS — R262 Difficulty in walking, not elsewhere classified: Secondary | ICD-10-CM

## 2024-01-01 DIAGNOSIS — G5621 Lesion of ulnar nerve, right upper limb: Secondary | ICD-10-CM | POA: Diagnosis not present

## 2024-01-01 DIAGNOSIS — M25551 Pain in right hip: Secondary | ICD-10-CM

## 2024-01-01 DIAGNOSIS — R278 Other lack of coordination: Secondary | ICD-10-CM

## 2024-01-01 DIAGNOSIS — G5622 Lesion of ulnar nerve, left upper limb: Secondary | ICD-10-CM | POA: Diagnosis not present

## 2024-01-01 DIAGNOSIS — M25552 Pain in left hip: Secondary | ICD-10-CM | POA: Diagnosis not present

## 2024-01-01 NOTE — Telephone Encounter (Signed)
 Requested medications are due for refill today.  yes  Requested medications are on the active medications list.  yes  Last refill. 01/22/2023 #90 3 rf  Future visit scheduled.   no  Notes to clinic.  Pt was seen 2x in the past 12 months for acute visits. Please advise.    Requested Prescriptions  Pending Prescriptions Disp Refills   mirabegron ER (MYRBETRIQ) 50 MG TB24 tablet [Pharmacy Med Name: MIRABEGRON ER 50MG  TAB 50 Tablet] 30 tablet 10    Sig: TAKE 1 TABLET BY MOUTH ONCE DAILY     Urology: Bladder Agents - mirabegron Failed - 01/01/2024 11:42 AM      Failed - Valid encounter within last 12 months    Recent Outpatient Visits   None            Passed - Cr in normal range and within 360 days    Creat  Date Value Ref Range Status  11/13/2023 1.01 0.50 - 1.03 mg/dL Final   Creatinine, Urine  Date Value Ref Range Status  11/13/2023 75 20 - 275 mg/dL Final         Passed - ALT in normal range and within 360 days    ALT  Date Value Ref Range Status  11/13/2023 22 6 - 29 U/L Final         Passed - AST in normal range and within 360 days    AST  Date Value Ref Range Status  11/13/2023 26 10 - 35 U/L Final         Passed - eGFR is 15 or above and within 360 days    GFR, Est African American  Date Value Ref Range Status  12/11/2020 74 > OR = 60 mL/min/1.85m2 Final   GFR, Est Non African American  Date Value Ref Range Status  12/11/2020 64 > OR = 60 mL/min/1.54m2 Final   GFR, Estimated  Date Value Ref Range Status  10/16/2023 >60 >60 mL/min Final    Comment:    (NOTE) Calculated using the CKD-EPI Creatinine Equation (2021)    eGFR  Date Value Ref Range Status  05/27/2022 29 (L) > OR = 60 mL/min/1.21m2 Final         Passed - Last BP in normal range    BP Readings from Last 1 Encounters:  12/05/23 131/80

## 2024-01-01 NOTE — Therapy (Signed)
 OUTPATIENT OCCUPATIONAL THERAPY NEURO TREATMENT NOTE  Patient Name: Alison [name] MRN: [id number] DOB:01-27-1976, 48 y.o., female Today's Date: 01/01/2024  PCP: [name], FNP REFERRING PROVIDER: Janice Coffin, PA-C  END OF SESSION:  OT End of Session - 01/01/24 1502     Visit Number 2    Number of Visits 12    Date for OT Re-Evaluation 01/29/24    OT Start Time 0930    OT Stop Time 1015    OT Time Calculation (min) 45 min    Activity Tolerance Patient tolerated treatment well    Behavior During Therapy WFL for tasks assessed/performed            Past Medical History:  Diagnosis Date   Abnormal uterine bleeding    Allergy    Anxiety    Arthritis    Bipolar disorder (manic depression) (HCC)    Chronic kidney failure    Chronic renal disease, stage III (HCC)    Diabetes mellitus without complication (HCC)    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HCC) 2015   Right axillary lymph node resected and chemo tx's.   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 02/2023 Echo: EF 55-60%, no rwma, nl RV size/fxn. Mildly dil LA.   Personality disorder (HCC)    Pineal gland cyst    Post herpetic neuralgia    PTSD (post-traumatic stress disorder)    Renal disorder    S/P liver transplant (HCC) 1993   Sleep apnea    has not recieved cpap yet-other one was recalled   Past Surgical History:  Procedure Laterality Date   BONE MARROW BIOPSY  01/13/2015   BREAST BIOPSY  12/2014   BREAST BIOPSY  2011   BREAST SURGERY     CHOLECYSTECTOMY     COLONOSCOPY     COLONOSCOPY  WITH PROPOFOL N/A 02/28/2022   Procedure: COLONOSCOPY WITH PROPOFOL;  Surgeon: Midge Minium, MD;  Location: [facility] END[facility];  Service: Endoscopy;  Laterality: N/A;   ESOPHAGOGASTRODUODENOSCOPY     ESOPHAGOGASTRODUODENOSCOPY (EGD) WITH PROPOFOL N/A 01/09/2021   Procedure: ESOPHAGOGASTRODUODENOSCOPY (EGD) WITH PROPOFOL;  Surgeon: Midge Minium, MD;  Location: [facility] ENDOSCOPY;  Service: Endoscopy;  Laterality: N/A;   HERNIA REPAIR     x4-all from liver transplant   HYSTEROSCOPY WITH D & C N/A 11/13/2021   Procedure: DILATATION AND CURETTAGE /HYSTEROSCOPY;  Surgeon: Natale Milch, MD;  Location: [facility] ORS;  Service: Gynecology;  Laterality: N/A;   infusaport     LIVER TRANSPLANT  12/17/1991   LUMBAR PUNCTURE     PORT A CATH INJECTION ([facility] HX)     RENAL BIOPSY     tumor removal  2015   cancer right side   Patient Active Problem List   Diagnosis Date Noted   Weakness 10/16/2023   Flu 10/16/2023   SIRS (systemic inflammatory response syndrome) (HCC) 10/15/2023   Elevated serum creatinine 10/15/2023   Myalgia 10/15/2023  Elevated LFTs 10/15/2023   Acute idiopathic pericarditis 03/09/2023   Acute pericarditis 03/08/2023   Sepsis (HCC) 03/06/2023   Chest pain 03/06/2023   CKD (chronic kidney disease), stage II 01/22/2023   Insulin dependent type 2 diabetes mellitus (HCC) 01/22/2023   Overactive bladder 01/22/2023   High risk medication use 12/12/2022   Sjogren's syndrome (HCC) 05/27/2022   Menorrhagia with irregular cycle    Akathisia 10/24/2021   At risk for prolonged QT interval syndrome 09/12/2021   Prolonged Q-T interval on ECG 08/29/2021   Insomnia 07/20/2021   Bipolar disorder, in full remission, most recent episode mixed (HCC) 07/20/2021   GAD (generalized anxiety disorder) 05/09/2021   Opioid dependence with opioid-induced disorder (HCC) 12/20/2020   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   Carpal tunnel syndrome, left 07/26/2019   Cubital tunnel syndrome on  left 07/26/2019   Umbilical hernia without obstruction and without gangrene 06/17/2019   Lumbar spondylosis 06/02/2019   Obesity (BMI 35.0-39.9 without comorbidity) 06/02/2019   Goals of care, counseling/discussion 05/18/2019   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   Fibromyalgia 03/23/2019   Systemic lupus erythematosus (SLE) in adult [facility]) 03/23/2019   Essential hypertension 03/23/2019   Mass of left kidney 03/23/2019   Nausea without vomiting 03/23/2019   Hyperlipidemia 03/23/2019   Osteoarthritis 03/23/2019   Trigeminal neuralgia 03/23/2019   Nephrolithiasis 03/23/2019   Migraine 03/23/2019   OSA on CPAP 03/23/2019   Fibrocystic breast 03/23/2019   Heart murmur 03/23/2019   Major depressive disorder, recurrent (HCC) 03/18/2017   Morbid obesity (HCC) 03/18/2017   Neuropathic pain 11/19/2016   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HCC) 01/13/2015   Lumbar disc disease with radiculopathy 07/26/2013   S/P liver transplant (HCC) 07/26/2013   Facial nerve disorder 06/29/2012   Low back pain 12/26/2010   ONSET DATE: 10/08/2023  REFERRING DIAG: Z61.09 (ICD-10-CM) - Ulnar neuropathy of both upper extremities   THERAPY DIAG:  Muscle weakness (generalized)  Other lack of coordination  Rationale for Evaluation and Treatment: Rehabilitation  SUBJECTIVE:  SUBJECTIVE STATEMENT: Pt. reports no pain in the elbows, more numbness in the fingers and weakness.   Pt accompanied by: self  PERTINENT HISTORY: Per MEDICAL RECORD NUMBERAssessment & Plan Ulnar Neuropathy Recurrent ulnar neuropathy with numbness, tingling in the last two fingers, muscle atrophy, and hand weakness for one month. Previous ulnar nerve entrapment resolved with exercises. Discussed occupational therapy and orthopedic evaluation for nerve release or injections.  - Refer to occupational therapy for hand exercises and ulnar nerve management - Refer to [facility]  - Follow up in 4-6 months to monitor progress  and reassess if symptoms worsen. Return precautions   PRECAUTIONS: Fall  WEIGHT BEARING RESTRICTIONS: No  PAIN:  Are you having pain? Tingling along ulnar distribution 4th, and 5th digits of the bilateral UEs with left greater than right.  FALLS: Has patient fallen in last 6 months? Yes. Number of falls 2 "serious falls"   LIVING ENVIRONMENT: Lives with: lives with their family, spouse and spouse's brother Lives in: 3rd floor apartment with elevator, 2 bedrooms Stairs: No Has following equipment at home: cane, rollator, power chair for long distance   PLOF: Independent  PATIENT GOALS: "I would like to try to un-entrap this nerve and gain back strength in my L hand."  OBJECTIVE:  Note: Objective measures were completed at Evaluation unless otherwise noted.  HAND DOMINANCE: Right  ADLs: Overall ADLs: BUE weakness and numbness contribute to increased difficulty with [facility] components of ADL/IADLs Transfers/ambulation  related to ADLs: modified indep-indep Eating: increased difficulty with cutting vegetables  Grooming: indep UB Dressing: indep LB Dressing: indep Toileting: indep Bathing: modified indep Tub Shower transfers: modified indep with shower chair Equipment: Shower seat with back  IADLs: Shopping: modified indep  Light housekeeping: indep Meal Prep: difficulty opening jars/containers, difficulty with prolonged standing; brother-in-law typically does the meal prep Community mobility: power chair for long distances (ie the zoo), occasional use of cane, occasional amb without AD; keeps a cane in each vehicle Medication management: modified indep (bubble packs from pharmacy) Financial management: shared task between spouse and pt Handwriting: 100% legible  MOBILITY STATUS:  see above  POSTURE COMMENTS:  Sitting balance:  WNL  ACTIVITY TOLERANCE: Activity tolerance: WFL for tasks assessed  FUNCTIONAL OUTCOME MEASURES: Eval:  MAM-20 for Musculoskeletal Conditions:  61/80   UPPER EXTREMITY ROM:  BUEs WNL  UPPER EXTREMITY MMT:     MMT Right eval Left eval  Shoulder flexion 4+ 4+  Shoulder abduction 4+ 4+  Shoulder adduction    Shoulder extension    Shoulder internal rotation    Shoulder external rotation    Middle trapezius    Lower trapezius    Elbow flexion 4+ 4+  Elbow extension 4+ 4+  Wrist flexion 5 4  Wrist extension 5 4  Wrist ulnar deviation 5 4+  Wrist radial deviation 4 4+  Wrist pronation 4+ 4  Wrist supination 4+ 4  (Blank rows = not tested)  HAND FUNCTION: Grip strength: Right: 62 lbs; Left: 37 lbs, Lateral pinch: Right: 14 lbs, Left: 9 lbs, and 3 point pinch: Right: 13 lbs, Left: 9 lbs  COORDINATION: 9 Hole Peg test: Right: 26 sec; Left: 28 sec  SENSATION: Light touch: Impaired , numbness/tingling in ulnar nerve distribution of BUEs L>R  EDEMA: No visible edema  MUSCLE TONE: RUE: Within functional limits and LUE: Within functional limits  COGNITION: Overall cognitive status: Within functional limits for tasks assessed  VISION: wears bifocals at baseline  PERCEPTION: Not tested  PRAXIS: WFL  OBSERVATIONS:  Pt. pleasant, cooperative, and eager to work towards goals in OT poc.                                                                                             TREATMENT DATE: 01/01/24  Therapeutic Ex.:   -Theraputty hand strengthening exercises with pink theraputty including: gross gripping, gross digit extension, lateral, and 3pt. pinch strengthening, thumb opposition, bilateral alternating twisting motion in opposite directions with supination/pronation, rolling circular spheres within the tips of the fingers, and manipulating putty within the tips of fingers to remove small objects. Pt. Was provided with a visual handout HEP through [employer] with a video access code.  -Reviewed full arm ulnar nerve glides, and ulnar nerve flossing exercises in standing with a HEP provided through  [employer] -Progressive gross grip strengthening with 23.4# of grip strength resistive force after setting the pegs up vertically multiple pegs at a time.   -Incorporated reaching in multiple planes to further challenge the task.   -left Lateral, and 3pt. Pinch strengthening using yellow, red, green, and blue, and black level resistive  clips     PATIENT EDUCATION: Education details: OT role, goals, poc; condition management educ Person educated: Patient Education method: Explanation and Verbal cues Education comprehension: verbalized understanding, further training needed  HOME EXERCISE PROGRAM: Initiated pink theraputty exercises; further training   GOALS: Goals reviewed with patient? Yes  SHORT TERM GOALS: Target date: 01/08/24  Pt will be indep to perform HEP for improving L hand strength for daily tasks. Baseline: Eval: Initiated theraputty exercises at eval Goal status: INITIAL  2.  Pt will be indep to verbalize 2-3 joint protection/cumulative trauma prevention strategies to reduce paresthesias in BUEs.  Baseline: Eval: Initiated educ on UE positioning during sleep/rest; further training needed Goal status: INITIAL  LONG TERM GOALS: Target date: 01/29/24  Pt will increase MAM-20 score by 9 or more points to indicate improvement in self perceived performance in [facility] components of daily tasks. Baseline: Eval: 61/80 Goal status: INITIAL  2.  Pt will increase L grip strength by 10 or more lbs in order to improve ability to open new jars/containers. Baseline: Eval: L grip strength 37 lbs (R 62 lbs) Goal status: INITIAL  3.  Pt will increase L lateral pinch strength by 3 or more lbs in order to improve efficiency with clipping R hand fingernails. Baseline: Eval: L 9 lbs (R 14 lbs) Goal status: INITIAL  4.  Pt will increase LUE distal strength by 1/2 muscle grade in order to improve ability to lift and carry heavy ADL supplies. Baseline: Eval: L wrist flex/ext 4/5 (R 5/5), L elbow  flex/ext 4/5 (R 5/5) Goal status: INITIAL  ASSESSMENT:  CLINICAL IMPRESSION:  Pt. required verbal cues, and assist for theraputty exercise. Pt. was able to demonstrate proper form and technique for the theraputty, ulnar nerve glides, and and hand strengthening exercises. Pt. Continues to work towards goals to increase LUE strength and reduce paresthesias in BUEs in order to improve performance with above noted tasks. Pt will benefit from skilled OT to address above noted deficits and work towards goals in OT poc.   PERFORMANCE DEFICITS: in functional skills including ADLs, IADLs, coordination, dexterity, sensation, strength, Fine motor control, body mechanics, decreased knowledge of precautions, decreased knowledge of use of DME, and UE functional use, cognitive skills including memory, and psychosocial skills including coping strategies, environmental adaptation, habits, and routines and behaviors.   IMPAIRMENTS: are limiting patient from ADLs, IADLs, rest and sleep, and leisure.   CO-MORBIDITIES: has co-morbidities such as s/p liver transplant, anxiety, depression, bipolar, lymphoma  that affects occupational performance. Patient will benefit from skilled OT to address above impairments and improve overall function.  MODIFICATION OR ASSISTANCE TO COMPLETE EVALUATION: No modification of tasks or assist necessary to complete an evaluation.  OT OCCUPATIONAL PROFILE AND HISTORY: Detailed assessment: Review of records and additional review of physical, cognitive, psychosocial history related to current functional performance.  CLINICAL DECISION MAKING: Moderate - several treatment options, min-mod task modification necessary  REHAB POTENTIAL: Good  EVALUATION COMPLEXITY: Moderate    PLAN:  OT FREQUENCY: 2x/week  OT DURATION: 6 weeks  PLANNED INTERVENTIONS: 97168 OT Re-evaluation, 97535 self care/ADL training, 78469 therapeutic exercise, 97530 therapeutic activity, 97112 neuromuscular  re-education, 97140 manual therapy, 97010 moist heat, 97010 cryotherapy, 97034 contrast bath, 97760 Orthotics management and training, 62952 Splinting (initial encounter), passive range of motion, psychosocial skills training, coping strategies training, patient/family education, and DME and/or AE instructions  RECOMMENDED OTHER SERVICES: Pt evaluated by PT this date to address bilat hip pain  CONSULTED AND AGREED WITH PLAN OF  CARE: Patient  PLAN FOR NEXT SESSION: see above  Olegario Messier, MS, OTR/L  01/01/2024, 3:05 PM

## 2024-01-01 NOTE — Therapy (Signed)
 OUTPATIENT PHYSICAL THERAPY LOWER EXTREMITY TREATMENT   Patient Name: Alison [name] MRN: [id number] DOB:1976/08/17, 48 y.o., female Today's Date: 01/01/2024  END OF SESSION:  PT End of Session - 01/01/24 1011     Visit Number 2    Number of Visits 16    Date for PT Re-Evaluation 02/12/24    PT Start Time 1015    PT Stop Time 1057    PT Time Calculation (min) 42 min    Equipment Utilized During Treatment Gait belt    Activity Tolerance Patient limited by fatigue    Behavior During Therapy WFL for tasks assessed/performed              Past Medical History:  Diagnosis Date   Abnormal uterine bleeding    Allergy    Anxiety    Arthritis    Bipolar disorder (manic depression) (HCC)    Chronic kidney failure    Chronic renal disease, stage III (HCC)    Diabetes mellitus without complication (HCC)    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HCC) 2015   Right axillary lymph node resected and chemo tx's.   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 02/2023 Echo: EF 55-60%, no rwma, nl RV size/fxn. Mildly dil LA.   Personality disorder (HCC)    Pineal gland cyst    Post herpetic neuralgia    PTSD (post-traumatic stress disorder)    Renal disorder    S/P liver transplant (HCC) 1993   Sleep apnea    has not recieved cpap yet-other one was recalled   Past Surgical History:  Procedure Laterality Date   BONE MARROW BIOPSY  01/13/2015   BREAST BIOPSY  12/2014   BREAST BIOPSY  2011   BREAST SURGERY     CHOLECYSTECTOMY     COLONOSCOPY     COLONOSCOPY WITH PROPOFOL N/A  02/28/2022   Procedure: COLONOSCOPY WITH PROPOFOL;  Surgeon: Midge Minium, MD;  Location: [facility];  Service: Endoscopy;  Laterality: N/A;   ESOPHAGOGASTRODUODENOSCOPY     ESOPHAGOGASTRODUODENOSCOPY (EGD) WITH PROPOFOL N/A 01/09/2021   Procedure: ESOPHAGOGASTRODUODENOSCOPY (EGD) WITH PROPOFOL;  Surgeon: Midge Minium, MD;  Location: [facility];  Service: Endoscopy;  Laterality: N/A;   HERNIA REPAIR     x4-all from liver transplant   HYSTEROSCOPY WITH D & C N/A 11/13/2021   Procedure: DILATATION AND CURETTAGE /HYSTEROSCOPY;  Surgeon: Natale Milch, MD;  Location: [facility];  Service: Gynecology;  Laterality: N/A;   infusaport     LIVER TRANSPLANT  12/17/1991   LUMBAR PUNCTURE     PORT A CATH INJECTION ([facility])     RENAL BIOPSY     tumor removal  2015   cancer right side   Patient Active Problem List   Diagnosis Date Noted   Weakness 10/16/2023   Flu 10/16/2023   SIRS (systemic inflammatory response syndrome) (HCC) 10/15/2023   Elevated serum creatinine 10/15/2023   Myalgia  10/15/2023   Elevated LFTs 10/15/2023   Acute idiopathic pericarditis 03/09/2023   Acute pericarditis 03/08/2023   Sepsis (HCC) 03/06/2023   Chest pain 03/06/2023   CKD (chronic kidney disease), stage II 01/22/2023   Insulin dependent type 2 diabetes mellitus (HCC) 01/22/2023   Overactive bladder 01/22/2023   High risk medication use 12/12/2022   Sjogren's syndrome (HCC) 05/27/2022   Menorrhagia with irregular cycle    Akathisia 10/24/2021   At risk for prolonged QT interval syndrome 09/12/2021   Prolonged Q-T interval on ECG 08/29/2021   Insomnia 07/20/2021   Bipolar disorder, in full remission, most recent episode mixed (HCC) 07/20/2021   GAD (generalized anxiety disorder) 05/09/2021   Opioid dependence with opioid-induced disorder (HCC) 12/20/2020   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   Carpal tunnel syndrome, left 07/26/2019   Cubital tunnel syndrome on left 07/26/2019    Umbilical hernia without obstruction and without gangrene 06/17/2019   Lumbar spondylosis 06/02/2019   Obesity (BMI 35.0-39.9 without comorbidity) 06/02/2019   Goals of care, counseling/discussion 05/18/2019   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   Fibromyalgia 03/23/2019   Systemic lupus erythematosus (SLE) in adult [facility]) 03/23/2019   Essential hypertension 03/23/2019   Mass of left kidney 03/23/2019   Nausea without vomiting 03/23/2019   Hyperlipidemia 03/23/2019   Osteoarthritis 03/23/2019   Trigeminal neuralgia 03/23/2019   Nephrolithiasis 03/23/2019   Migraine 03/23/2019   OSA on CPAP 03/23/2019   Fibrocystic breast 03/23/2019   Heart murmur 03/23/2019   Major depressive disorder, recurrent (HCC) 03/18/2017   Morbid obesity (HCC) 03/18/2017   Neuropathic pain 11/19/2016   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HCC) 01/13/2015   Lumbar disc disease with radiculopathy 07/26/2013   S/P liver transplant (HCC) 07/26/2013   Facial nerve disorder 06/29/2012   Low back pain 12/26/2010    PCP: [name] FNP  REFERRING PROVIDER: Janice Coffin PA  REFERRING DIAG: hip pain, bilateral  THERAPY DIAG:  Muscle weakness (generalized)  Pain of both hip joints  Difficulty in walking, not elsewhere classified  Rationale for Evaluation and Treatment: Rehabilitation  ONSET DATE: 2024  SUBJECTIVE:   SUBJECTIVE STATEMENT:   Patient denies pain at this time. She only has trouble with hips when taking short strides but typically does not have any problems with longer strides.   PERTINENT HISTORY: Patient presents for evaluation of bilateral hip pain. She additionally  has PMH of ulnar neuropathy, migraines, trigeminal neuralgia, bilateral resting tremors, fibromyalgia, lupus, lymphoma, gout, depression, bipolar disorder, DM, GERD, anxiety, chronic renal disease stage III, liver transplant (27 years ago for lupoid hepatitis), and chemotherapy.  Patient reports she can  walk for a little bit then her hips hurt and then she has to sit for a long period.   Reports it has been getting worse. When she steps wrong it gets worse.  PAIN:  Are you having pain? Yes: NPRS scale: worst: 8/10, least pain: 0/10 Pain location: deep inside hip region Pain description: pinching pain Aggravating factors: walking, stepping wrong Relieving factors: rest  PRECAUTIONS: Fall  RED FLAGS: None   WEIGHT BEARING RESTRICTIONS: No  FALLS:  Has patient fallen in last 6 months? Yes. Number of falls 2  LIVING ENVIRONMENT: Lives with: lives with their spouse Lives in: House/apartment Stairs:  elevator Has following equipment at home: Single point cane, Environmental consultant - 4 wheeled, Wheelchair (power), Wheelchair (manual), and shower chair  OCCUPATION: disability;   Patient enjoys crafting, beadwork, jewelry, applicate, sewing.   PLOF: Independent with  household mobility with device  PATIENT GOALS: to figure out what's triggering the pain, decrease pain  NEXT MD VISIT: patient not sure  OBJECTIVE:  Note: Objective measures were completed at Evaluation unless otherwise noted.  DIAGNOSTIC FINDINGS:   MRI T-Spine with and without contrast 05/18/2018: Moderate multilevel spondylosis and associated disc changes most significantly at T11-12, no abnormal cord signal, as detailed above. This study was compared to the patient's previous one dated 11/19/2017, stable findings.  MRI C-Spine 03/05/2018: Mild spondylosis and associated disc changes of the cervical and imaged thoracic spine, most significantly at T2-3, as detailed above. No intrinsic cord lesion to suggest multiple sclerosis. Left thyroid gland peripherally enhancing nodule recommend further evaluation should this be a new finding, such as with ultrasound.This study was compared to the patient's previous one dated 03/30/2010, mild worsening at C5-6, previous study was limited to sagittal plane only.  MRI Brain 03/05/2018: Incidental  note of partial opacification of the right sphenoid sinus. Vein adjacent and lateral to the left trigeminal nerve, no indentation or impingement, perpendicular to the nerve itself, doubtful clinical significance. This study was compared to the patient's previous one dated 08/31/2014, stable findings.   PATIENT SURVEYS:  LEFS 42  COGNITION: Overall cognitive status:  history of short term memory problems       SENSATION: WFL;   POSTURE: posterior pelvic tilt and flexed trunk   PALPATION: No tenderness to palpation   LOWER EXTREMITY ROM: WFL:  Slight lack of motion in hip extension bilaterally LOWER EXTREMITY MMT:  MMT Right eval Left eval  Hip flexion 4- 4-  Hip extension    Hip abduction 3 3  Hip adduction 3- 3-  Hip internal rotation    Hip external rotation    Knee flexion 3 3  Knee extension 3+ 3+  Ankle dorsiflexion 4- 4-  Ankle plantarflexion 4- 4-  Ankle inversion    Ankle eversion     (Blank rows = not tested)  LOWER EXTREMITY SPECIAL TESTS:  Hip special tests: Luisa Hart (FABER) test: positive , SI compression test: negative, SI distraction test: negative, Hip scouring test: positive , and Anterior hip impingement test: positive   FUNCTIONAL TESTS:  5 times sit to stand: 25.5 seconds with hands on plinth table  10 meter walk test: 8 seconds   GAIT: Distance walked: 40 ft Assistive device utilized: None Level of assistance: CGA Comments: decreased step length                                                                                                                                TREATMENT DATE: 01/01/24   Supine bridge 2 x 10   Supine marching 2 x 10 each LE  Supine hip abduction with RTB 2 x 10  Seated hip adduction with ball squeeze 2 x 10 with 3 second hold  Seated LAQ 2# AW 2 x 10 with 2 second hold each LE  Sit to stand  2 x 10 from low mat surface with no UE support  Ambulation ~340' with 2# AW for BLE strengthening Standing 6" step taps  with no UE support 2# AW 2 x 10 each LE  Standing hip  abduction 2# AW 2 x 10 each LE  Standing marching 2# AW 2 x 10 each LE Standing heel/toe raises 2# AW 2 x 10 each LE/direction Standing hip extension 2# AW 2 x 10 each LE   PATIENT EDUCATION:  Education details: goals, POC HEP  Person educated: Patient Education method: Explanation, Demonstration, Tactile cues, and Verbal cues Education comprehension: verbalized understanding, returned demonstration, verbal cues required, tactile cues required, and needs further education  HOME EXERCISE PROGRAM: Access Code: [email or web address] URL: [email or web address] Date: 12/18/2023 Prepared by: Precious Bard  Exercises - Leg Extension  - 1 x daily - 7 x weekly - 2 sets - 10 reps - 5 hold - Seated Hip Adduction Isometrics with Ball  - 1 x daily - 7 x weekly - 2 sets - 10 reps - 5 hold - Seated Hip Abduction with Resistance  - 1 x daily - 7 x weekly - 2 sets - 10 reps - 5 hold  ASSESSMENT:  CLINICAL IMPRESSION:   Patient motivated to participate and puts forth excellent effort. Session focused on B LE strengthening with resistance. Tolerated session well with no increase in pain. Patient will benefit from skilled physical therapy to reduce pain, improve functional mobility, and improve quality of life.   OBJECTIVE IMPAIRMENTS: Abnormal gait, decreased activity tolerance, decreased endurance, decreased mobility, difficulty walking, decreased strength, hypomobility, impaired perceived functional ability, impaired flexibility, improper body mechanics, postural dysfunction, obesity, and pain.   ACTIVITY LIMITATIONS: bending, standing, squatting, stairs, transfers, locomotion level, and caring for others  PARTICIPATION LIMITATIONS: meal prep, cleaning, laundry, shopping, and community activity  PERSONAL FACTORS: Age, Fitness, Past/current experiences, Time since onset of injury/illness/exacerbation, and 3+ comorbidities: ulnar neuropathy,  migraines, trigeminal neuralgia, bilateral resting tremors, fibromyalgia, lupus, lymphoma, gout, depression, bipolar disorder, DM, GERD, anxiety, chronic renal disease stage III, liver transplant (27 years ago for lupoid hepatitis), and chemotherapy.   are also affecting patient's functional outcome.   REHAB POTENTIAL: Good  CLINICAL DECISION MAKING: Evolving/moderate complexity  EVALUATION COMPLEXITY: Moderate   GOALS: Goals reviewed with patient? Yes  SHORT TERM GOALS: Target date: 01/15/2024   Patient will be independent in home exercise program to improve strength/mobility for better functional independence with ADLs. Baseline: 3/13: HEP given Goal status: INITIAL    LONG TERM GOALS: Target date: 02/12/2024    Patient (< 22 years old) will complete five times sit to stand test in < 10 seconds indicating an increased LE strength and improved balance. Baseline: 25.5 seconds with UE support Goal status: INITIAL  2.  Patient will be able to ambulate 2000 steps and/or >6 minutes prior to pain increase allowing for improved community ambulation  Baseline: 3/13: 1000 steps prior to pain Goal status: INITIAL  3.  Patient will report a worst pain of 3/10 on VAS in bilateral hips to improve tolerance with ADLs and reduced symptoms with activities.  Baseline: 3/13: 8/10  Goal status: INITIAL  4.  Patient will increase lower extremity functional scale to >60/80 to demonstrate improved functional mobility and increased tolerance with ADLs.  Baseline: 3/13: 42 Goal status: INITIAL    PLAN:  PT FREQUENCY: 2x/week  PT DURATION: 8 weeks  PLANNED INTERVENTIONS: 97164- PT Re-evaluation, 97110-Therapeutic exercises, 97530- Therapeutic activity, 97112- Neuromuscular re-education, 97535- Self Care, 82956- Manual  therapy, [id number]- Gait training, 62952- Orthotic Fit/training, 84132- Splinting, [phone]- Electrical stimulation (unattended), [phone]- Electrical stimulation (manual), [id number]-  Vasopneumatic device, [id number]- Ultrasound, [id number]- Traction (mechanical), Patient/Family education, Balance training, Stair training, Taping, Dry Needling, Joint mobilization, Spinal mobilization, Compression bandaging, Vestibular training, Visual/preceptual remediation/compensation, DME instructions, Wheelchair mobility training, Cryotherapy, and Moist heat  PLAN FOR NEXT SESSION: hip strengthening, try distraction of hips?    Maylon Peppers, PT, DPT Physical Therapist - [place]  [facility]  01/01/2024, 10:11 AM

## 2024-01-02 NOTE — Therapy (Signed)
 OUTPATIENT PHYSICAL THERAPY LOWER EXTREMITY TREATMENT   Patient Name: Alison [name] MRN: [id number] DOB:20-Sep-1976, 48 y.o., female Today's Date: 01/05/2024  END OF SESSION:  PT End of Session - 01/05/24 0749     Visit Number 3    Number of Visits 16    Date for PT Re-Evaluation 02/12/24    PT Start Time 0800    PT Stop Time 0842    PT Time Calculation (min) 42 min    Equipment Utilized During Treatment Gait belt    Activity Tolerance Patient limited by fatigue    Behavior During Therapy WFL for tasks assessed/performed               Past Medical History:  Diagnosis Date   Abnormal uterine bleeding    Allergy    Anxiety    Arthritis    Bipolar disorder (manic depression) (HCC)    Chronic kidney failure    Chronic renal disease, stage III (HCC)    Diabetes mellitus without complication (HCC)    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HCC) 2015   Right axillary lymph node resected and chemo tx's.   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 02/2023 Echo: EF 55-60%, no rwma, nl RV size/fxn. Mildly dil LA.   Personality disorder (HCC)    Pineal gland cyst    Post herpetic neuralgia    PTSD (post-traumatic stress disorder)    Renal disorder    S/P liver transplant (HCC) 1993   Sleep apnea    has not recieved cpap yet-other one was recalled   Past Surgical History:  Procedure Laterality Date   BONE MARROW BIOPSY  01/13/2015   BREAST BIOPSY  12/2014   BREAST BIOPSY  2011   BREAST SURGERY     CHOLECYSTECTOMY     COLONOSCOPY     COLONOSCOPY WITH PROPOFOL  N/A 02/28/2022   Procedure: COLONOSCOPY WITH PROPOFOL;  Surgeon: Midge Minium, MD;  Location: [facility];  Service: Endoscopy;  Laterality: N/A;   ESOPHAGOGASTRODUODENOSCOPY     ESOPHAGOGASTRODUODENOSCOPY (EGD) WITH PROPOFOL N/A 01/09/2021   Procedure: ESOPHAGOGASTRODUODENOSCOPY (EGD) WITH PROPOFOL;  Surgeon: Midge Minium, MD;  Location: [facility];  Service: Endoscopy;  Laterality: N/A;   HERNIA REPAIR     x4-all from liver transplant   HYSTEROSCOPY WITH D & C N/A 11/13/2021   Procedure: DILATATION AND CURETTAGE /HYSTEROSCOPY;  Surgeon: Natale Milch, MD;  Location: [facility] ORS;  Service: Gynecology;  Laterality: N/A;   infusaport     LIVER TRANSPLANT  12/17/1991   LUMBAR PUNCTURE     PORT A CATH INJECTION ([facility] HX)     RENAL BIOPSY     tumor removal  2015   cancer right side   Patient Active Problem List   Diagnosis Date Noted   Weakness 10/16/2023   Flu 10/16/2023   SIRS (systemic inflammatory response syndrome) (HCC) 10/15/2023   Elevated serum creatinine 10/15/2023  Myalgia 10/15/2023   Elevated LFTs 10/15/2023   Acute idiopathic pericarditis 03/09/2023   Acute pericarditis 03/08/2023   Sepsis (HCC) 03/06/2023   Chest pain 03/06/2023   CKD (chronic kidney disease), stage II 01/22/2023   Insulin dependent type 2 diabetes mellitus (HCC) 01/22/2023   Overactive bladder 01/22/2023   High risk medication use 12/12/2022   Sjogren's syndrome (HCC) 05/27/2022   Menorrhagia with irregular cycle    Akathisia 10/24/2021   At risk for prolonged QT interval syndrome 09/12/2021   Prolonged Q-T interval on ECG 08/29/2021   Insomnia 07/20/2021   Bipolar disorder, in full remission, most recent episode mixed (HCC) 07/20/2021   GAD (generalized anxiety disorder) 05/09/2021   Opioid dependence with opioid-induced disorder (HCC) 12/20/2020   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   Carpal tunnel syndrome, left 07/26/2019   Cubital tunnel syndrome on left 07/26/2019    Umbilical hernia without obstruction and without gangrene 06/17/2019   Lumbar spondylosis 06/02/2019   Obesity (BMI 35.0-39.9 without comorbidity) 06/02/2019   Goals of care, counseling/discussion 05/18/2019   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   Fibromyalgia 03/23/2019   Systemic lupus erythematosus (SLE) in adult [facility]) 03/23/2019   Essential hypertension 03/23/2019   Mass of left kidney 03/23/2019   Nausea without vomiting 03/23/2019   Hyperlipidemia 03/23/2019   Osteoarthritis 03/23/2019   Trigeminal neuralgia 03/23/2019   Nephrolithiasis 03/23/2019   Migraine 03/23/2019   OSA on CPAP 03/23/2019   Fibrocystic breast 03/23/2019   Heart murmur 03/23/2019   Major depressive disorder, recurrent (HCC) 03/18/2017   Morbid obesity (HCC) 03/18/2017   Neuropathic pain 11/19/2016   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HCC) 01/13/2015   Lumbar disc disease with radiculopathy 07/26/2013   S/P liver transplant (HCC) 07/26/2013   Facial nerve disorder 06/29/2012   Low back pain 12/26/2010    PCP: [name] FNP  REFERRING PROVIDER: Janice Coffin PA  REFERRING DIAG: hip pain, bilateral  THERAPY DIAG:  Muscle weakness (generalized)  Difficulty in walking, not elsewhere classified  Pain of both hip joints  Rationale for Evaluation and Treatment: Rehabilitation  ONSET DATE: 2024  SUBJECTIVE:   SUBJECTIVE STATEMENT:    Patient reports slight discomfort in her hips (rates 1/10). She was sore after last session.   PERTINENT HISTORY: Patient presents for evaluation of bilateral hip pain. She additionally  has PMH of ulnar neuropathy, migraines, trigeminal neuralgia, bilateral resting tremors, fibromyalgia, lupus, lymphoma, gout, depression, bipolar disorder, DM, GERD, anxiety, chronic renal disease stage III, liver transplant (27 years ago for lupoid hepatitis), and chemotherapy.  Patient reports she can walk for a little bit then her hips hurt and then she has  to sit for a long period.   Reports it has been getting worse. When she steps wrong it gets worse.  PAIN:  Are you having pain? Yes: NPRS scale: worst: 8/10, least pain: 0/10 Pain location: deep inside hip region Pain description: pinching pain Aggravating factors: walking, stepping wrong Relieving factors: rest  PRECAUTIONS: Fall  RED FLAGS: None   WEIGHT BEARING RESTRICTIONS: No  FALLS:  Has patient fallen in last 6 months? Yes. Number of falls 2  LIVING ENVIRONMENT: Lives with: lives with their spouse Lives in: House/apartment Stairs:  elevator Has following equipment at home: Single point cane, Environmental consultant - 4 wheeled, Wheelchair (power), Wheelchair (manual), and shower chair  OCCUPATION: disability;   Patient enjoys crafting, beadwork, jewelry, applicate, sewing.   PLOF: Independent with household mobility with device  PATIENT GOALS: to figure  out what's triggering the pain, decrease pain  NEXT MD VISIT: patient not sure  OBJECTIVE:  Note: Objective measures were completed at Evaluation unless otherwise noted.  DIAGNOSTIC FINDINGS:   MRI T-Spine with and without contrast 05/18/2018: Moderate multilevel spondylosis and associated disc changes most significantly at T11-12, no abnormal cord signal, as detailed above. This study was compared to the patient's previous one dated 11/19/2017, stable findings.  MRI C-Spine 03/05/2018: Mild spondylosis and associated disc changes of the cervical and imaged thoracic spine, most significantly at T2-3, as detailed above. No intrinsic cord lesion to suggest multiple sclerosis. Left thyroid gland peripherally enhancing nodule recommend further evaluation should this be a new finding, such as with ultrasound.This study was compared to the patient's previous one dated 03/30/2010, mild worsening at C5-6, previous study was limited to sagittal plane only.  MRI Brain 03/05/2018: Incidental note of partial opacification of the right sphenoid  sinus. Vein adjacent and lateral to the left trigeminal nerve, no indentation or impingement, perpendicular to the nerve itself, doubtful clinical significance. This study was compared to the patient's previous one dated 08/31/2014, stable findings.   PATIENT SURVEYS:  LEFS 42  COGNITION: Overall cognitive status:  history of short term memory problems       SENSATION: WFL;   POSTURE: posterior pelvic tilt and flexed trunk   PALPATION: No tenderness to palpation   LOWER EXTREMITY ROM: WFL:  Slight lack of motion in hip extension bilaterally LOWER EXTREMITY MMT:  MMT Right eval Left eval  Hip flexion 4- 4-  Hip extension    Hip abduction 3 3  Hip adduction 3- 3-  Hip internal rotation    Hip external rotation    Knee flexion 3 3  Knee extension 3+ 3+  Ankle dorsiflexion 4- 4-  Ankle plantarflexion 4- 4-  Ankle inversion    Ankle eversion     (Blank rows = not tested)  LOWER EXTREMITY SPECIAL TESTS:  Hip special tests: Luisa Hart (FABER) test: positive , SI compression test: negative, SI distraction test: negative, Hip scouring test: positive , and Anterior hip impingement test: positive   FUNCTIONAL TESTS:  5 times sit to stand: 25.5 seconds with hands on plinth table  10 meter walk test: 8 seconds   GAIT: Distance walked: 40 ft Assistive device utilized: None Level of assistance: CGA Comments: decreased step length                                                                                                                                TREATMENT DATE: 01/05/24     Nustep level 2 x 8 minutes for cardiorespiratory endurance and BLE strength (SPM >40) Sit to stand 2 x 12 from green chair with no UE support  Seated LAQ 2# AW 2 x 10 with 2 second hold each LE  Standing marching 2# AW 2 x 10 each LE Standing hip abduction 2# AW 2 x 10 each LE Standing  heel/toe raises 2# AW 2 x 10 each LE/direction Ambulation ~340' with 2# AW for BLE strengthening Lateral  stepping 10' x 6 laps with RTB around ankles  6" step ups with no UE support 2 x 10 leading with each LE    PATIENT EDUCATION:  Education details: goals, POC HEP  Person educated: Patient Education method: Explanation, Demonstration, Tactile cues, and Verbal cues Education comprehension: verbalized understanding, returned demonstration, verbal cues required, tactile cues required, and needs further education  HOME EXERCISE PROGRAM: Access Code: [email or web address] URL: [email or web address] Date: 12/18/23 --> updated 01/05/2024 by Maylon Peppers  Prepared by: Precious Bard   Exercises - Leg Extension  - 1 x daily - 3-4 x weekly - 2 sets - 10 reps - 5 hold - Seated Hip Adduction Isometrics with Ball  - 1 x daily - 3-4 x weekly - 2 sets - 10 reps - 5 hold - Seated Hip Abduction with Resistance  - 1 x daily - 3-4 x weekly - 2 sets - 10 reps - 5 hold - Standing Hip Abduction with Counter Support  - 1 x daily - 3-4 x weekly - 3 sets - 10 reps - Standing March with Counter Support  - 1 x daily - 3-4 x weekly - 3 sets - 10 reps - Heel Raises with Counter Support  - 1 x daily - 3-4 x weekly - 3 sets - 10 reps - Sit to Stand with Arms Crossed  - 1 x daily - 3-4 x weekly - 3 sets - 10 reps   ASSESSMENT:  CLINICAL IMPRESSION:     Patient arrives motivated to participate and puts forth excellent effort. Session focused on updating HEP with standing LE strengthening exercises with bodyweight as well as continued strengthening with resistance in the clinic. Patient will benefit from skilled physical therapy to reduce pain, improve functional mobility, and improve quality of life.   OBJECTIVE IMPAIRMENTS: Abnormal gait, decreased activity tolerance, decreased endurance, decreased mobility, difficulty walking, decreased strength, hypomobility, impaired perceived functional ability, impaired flexibility, improper body mechanics, postural dysfunction, obesity, and pain.   ACTIVITY LIMITATIONS:  bending, standing, squatting, stairs, transfers, locomotion level, and caring for others  PARTICIPATION LIMITATIONS: meal prep, cleaning, laundry, shopping, and community activity  PERSONAL FACTORS: Age, Fitness, Past/current experiences, Time since onset of injury/illness/exacerbation, and 3+ comorbidities: ulnar neuropathy, migraines, trigeminal neuralgia, bilateral resting tremors, fibromyalgia, lupus, lymphoma, gout, depression, bipolar disorder, DM, GERD, anxiety, chronic renal disease stage III, liver transplant (27 years ago for lupoid hepatitis), and chemotherapy.   are also affecting patient's functional outcome.   REHAB POTENTIAL: Good  CLINICAL DECISION MAKING: Evolving/moderate complexity  EVALUATION COMPLEXITY: Moderate   GOALS: Goals reviewed with patient? Yes  SHORT TERM GOALS: Target date: 01/15/2024   Patient will be independent in home exercise program to improve strength/mobility for better functional independence with ADLs. Baseline: 3/13: HEP given Goal status: INITIAL    LONG TERM GOALS: Target date: 02/12/2024    Patient (< 9 years old) will complete five times sit to stand test in < 10 seconds indicating an increased LE strength and improved balance. Baseline: 25.5 seconds with UE support Goal status: INITIAL  2.  Patient will be able to ambulate 2000 steps and/or >6 minutes prior to pain increase allowing for improved community ambulation  Baseline: 3/13: 1000 steps prior to pain Goal status: INITIAL  3.  Patient will report a worst pain of 3/10 on VAS in bilateral hips to improve tolerance with ADLs and reduced symptoms with  activities.  Baseline: 3/13: 8/10  Goal status: INITIAL  4.  Patient will increase lower extremity functional scale to >60/80 to demonstrate improved functional mobility and increased tolerance with ADLs.  Baseline: 3/13: 42 Goal status: INITIAL    PLAN:  PT FREQUENCY: 2x/week  PT DURATION: 8 weeks  PLANNED  INTERVENTIONS: 97164- PT Re-evaluation, 97110-Therapeutic exercises, 97530- Therapeutic activity, 97112- Neuromuscular re-education, 97535- Self Care, 78295- Manual therapy, [phone]- Gait training, [phone]- Orthotic Fit/training, [phone]- Splinting, [phone]- Electrical stimulation (unattended), [phone]- Electrical stimulation (manual), 97016- Vasopneumatic device, [id number]- Ultrasound, [id number]- Traction (mechanical), Patient/Family education, Balance training, Stair training, Taping, Dry Needling, Joint mobilization, Spinal mobilization, Compression bandaging, Vestibular training, Visual/preceptual remediation/compensation, DME instructions, Wheelchair mobility training, Cryotherapy, and Moist heat  PLAN FOR NEXT SESSION: hip strengthening, upgrade HEP with resistance bands as appropriate    Maylon Peppers, PT, DPT Physical Therapist - [place]  [facility]  01/05/2024, 7:50 AM

## 2024-01-05 ENCOUNTER — Ambulatory Visit

## 2024-01-05 ENCOUNTER — Ambulatory Visit: Admitting: Nurse Practitioner

## 2024-01-05 ENCOUNTER — Encounter: Payer: Self-pay | Admitting: Physical Therapy

## 2024-01-05 ENCOUNTER — Encounter: Payer: Self-pay | Admitting: Nurse Practitioner

## 2024-01-05 ENCOUNTER — Encounter: Payer: Self-pay | Admitting: Oncology

## 2024-01-05 VITALS — BP 118/78 | Temp 98.3°F | Resp 18 | Ht 66.0 in | Wt 252.5 lb

## 2024-01-05 DIAGNOSIS — N6312 Unspecified lump in the right breast, upper inner quadrant: Secondary | ICD-10-CM | POA: Diagnosis not present

## 2024-01-05 DIAGNOSIS — M6281 Muscle weakness (generalized): Secondary | ICD-10-CM

## 2024-01-05 DIAGNOSIS — R262 Difficulty in walking, not elsewhere classified: Secondary | ICD-10-CM | POA: Diagnosis not present

## 2024-01-05 DIAGNOSIS — M25551 Pain in right hip: Secondary | ICD-10-CM

## 2024-01-05 DIAGNOSIS — M25552 Pain in left hip: Secondary | ICD-10-CM | POA: Diagnosis not present

## 2024-01-05 DIAGNOSIS — G5621 Lesion of ulnar nerve, right upper limb: Secondary | ICD-10-CM | POA: Diagnosis not present

## 2024-01-05 DIAGNOSIS — R278 Other lack of coordination: Secondary | ICD-10-CM | POA: Diagnosis not present

## 2024-01-05 DIAGNOSIS — G5622 Lesion of ulnar nerve, left upper limb: Secondary | ICD-10-CM

## 2024-01-05 NOTE — Therapy (Signed)
 OUTPATIENT OCCUPATIONAL THERAPY NEURO TREATMENT NOTE  Patient Name: Alison [name] MRN: [id number] DOB:[birth year], 48 y.o., female Today's Date: 01/05/2024  PCP: [name], FNP REFERRING PROVIDER: Janice Coffin, PA-C  END OF SESSION:  OT End of Session - 01/05/24 0859     Visit Number 3    Number of Visits 12    Date for OT Re-Evaluation 01/29/24    Progress Note Due on Visit 10    OT Start Time 0845    OT Stop Time 0930    OT Time Calculation (min) 45 min    Activity Tolerance Patient tolerated treatment well            Past Medical History:  Diagnosis Date   Abnormal uterine bleeding    Allergy    Anxiety    Arthritis    Bipolar disorder (manic depression) (HCC)    Chronic kidney failure    Chronic renal disease, stage III (HCC)    Diabetes mellitus without complication (HCC)    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HCC) 2015   Right axillary lymph node resected and chemo tx's.   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 02/2023 Echo: EF 55-60%, no rwma, nl RV size/fxn. Mildly dil LA.   Personality disorder (HCC)    Pineal gland cyst    Post herpetic neuralgia    PTSD (post-traumatic stress disorder)    Renal disorder    S/P liver transplant (HCC) 1993   Sleep apnea    has not recieved cpap yet-other one was recalled   Past Surgical History:  Procedure Laterality Date   BONE MARROW BIOPSY  01/13/2015   BREAST BIOPSY  12/2014   BREAST BIOPSY  2011   BREAST SURGERY     CHOLECYSTECTOMY     COLONOSCOPY     COLONOSCOPY WITH PROPOFOL N/A  02/28/2022   Procedure: COLONOSCOPY WITH PROPOFOL;  Surgeon: Midge Minium, MD;  Location: [facility];  Service: Endoscopy;  Laterality: N/A;   ESOPHAGOGASTRODUODENOSCOPY     ESOPHAGOGASTRODUODENOSCOPY (EGD) WITH PROPOFOL N/A 01/09/2021   Procedure: ESOPHAGOGASTRODUODENOSCOPY (EGD) WITH PROPOFOL;  Surgeon: Midge Minium, MD;  Location: [facility] END[facility];  Service: Endoscopy;  Laterality: N/A;   HERNIA REPAIR     x4-all from liver transplant   HYSTEROSCOPY WITH D & C N/A 11/13/2021   Procedure: DILATATION AND CURETTAGE /HYSTEROSCOPY;  Surgeon: Natale Milch, MD;  Location: [facility] ORS;  Service: Gynecology;  Laterality: N/A;   infusaport     LIVER TRANSPLANT  12/17/1991   LUMBAR PUNCTURE     PORT A CATH INJECTION ([facility] HX)     RENAL BIOPSY     tumor removal  2015   cancer right side   Patient Active Problem List   Diagnosis Date Noted   Weakness 10/16/2023   Flu 10/16/2023   SIRS (systemic inflammatory response syndrome) (HCC) 10/15/2023   Elevated serum creatinine 10/15/2023   Myalgia 10/15/2023  Elevated LFTs 10/15/2023   Acute idiopathic pericarditis 03/09/2023   Acute pericarditis 03/08/2023   Sepsis (HCC) 03/06/2023   Chest pain 03/06/2023   CKD (chronic kidney disease), stage II 01/22/2023   Insulin dependent type 2 diabetes mellitus (HCC) 01/22/2023   Overactive bladder 01/22/2023   High risk medication use 12/12/2022   Sjogren's syndrome (HCC) 05/27/2022   Menorrhagia with irregular cycle    Akathisia 10/24/2021   At risk for prolonged QT interval syndrome 09/12/2021   Prolonged Q-T interval on ECG 08/29/2021   Insomnia 07/20/2021   Bipolar disorder, in full remission, most recent episode mixed (HCC) 07/20/2021   GAD (generalized anxiety disorder) 05/09/2021   Opioid dependence with opioid-induced disorder (HCC) 12/20/2020   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   Carpal tunnel syndrome, left 07/26/2019   Cubital tunnel syndrome on left 07/26/2019    Umbilical hernia without obstruction and without gangrene 06/17/2019   Lumbar spondylosis 06/02/2019   Obesity (BMI 35.0-39.9 without comorbidity) 06/02/2019   Goals of care, counseling/discussion 05/18/2019   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   Fibromyalgia 03/23/2019   Systemic lupus erythematosus (SLE) in adult [facility]) 03/23/2019   Essential hypertension 03/23/2019   Mass of left kidney 03/23/2019   Nausea without vomiting 03/23/2019   Hyperlipidemia 03/23/2019   Osteoarthritis 03/23/2019   Trigeminal neuralgia 03/23/2019   Nephrolithiasis 03/23/2019   Migraine 03/23/2019   OSA on CPAP 03/23/2019   Fibrocystic breast 03/23/2019   Heart murmur 03/23/2019   Major depressive disorder, recurrent (HCC) 03/18/2017   Morbid obesity (HCC) 03/18/2017   Neuropathic pain 11/19/2016   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HCC) 01/13/2015   Lumbar disc disease with radiculopathy 07/26/2013   S/P liver transplant (HCC) 07/26/2013   Facial nerve disorder 06/29/2012   Low back pain 12/26/2010   ONSET DATE: 10/08/2023  REFERRING DIAG: Z61.09 (ICD-10-CM) - Ulnar neuropathy of both upper extremities   THERAPY DIAG:  Muscle weakness (generalized)  Ulnar neuropathy at elbow of left upper extremity  Ulnar neuropathy at elbow of right upper extremity  Rationale for Evaluation and Treatment: Rehabilitation  SUBJECTIVE:  SUBJECTIVE STATEMENT: Pt. reports she's going to the doctor after OT as she noticed a lump in her breast this morning. Pt accompanied by: self  PERTINENT HISTORY: Per MEDICAL RECORD NUMBERAssessment & Plan Ulnar Neuropathy Recurrent ulnar neuropathy with numbness, tingling in the last two fingers, muscle atrophy, and hand weakness for one month. Previous ulnar nerve entrapment resolved with exercises. Discussed occupational therapy and orthopedic evaluation for nerve release or injections.  - Refer to occupational therapy for hand exercises and ulnar nerve  management - Refer to [facility]  - Follow up in 4-6 months to monitor progress and reassess if symptoms worsen. Return precautions   PRECAUTIONS: Fall  WEIGHT BEARING RESTRICTIONS: No  PAIN: 01/05/24: 2/10 L hip Are you having pain? Tingling along ulnar distribution 4th, and 5th digits of the bilateral UEs with left greater than right.  FALLS: Has patient fallen in last 6 months? Yes. Number of falls 2 "serious falls"   LIVING ENVIRONMENT: Lives with: lives with their family, spouse and spouse's brother Lives in: 3rd floor apartment with elevator, 2 bedrooms Stairs: No Has following equipment at home: cane, rollator, power chair for long distance   PLOF: Independent  PATIENT GOALS: "I would like to try to un-entrap this nerve and gain back strength in my L hand."  OBJECTIVE:  Note: Objective measures were completed at Evaluation unless otherwise noted.  HAND DOMINANCE:  Right  ADLs: Overall ADLs: BUE weakness and numbness contribute to increased difficulty with [facility] components of ADL/IADLs Transfers/ambulation related to ADLs: modified indep-indep Eating: increased difficulty with cutting vegetables  Grooming: indep UB Dressing: indep LB Dressing: indep Toileting: indep Bathing: modified indep Tub Shower transfers: modified indep with shower chair Equipment: Shower seat with back  IADLs: Shopping: modified indep  Light housekeeping: indep Meal Prep: difficulty opening jars/containers, difficulty with prolonged standing; brother-in-law typically does the meal prep Community mobility: power chair for long distances (ie the zoo), occasional use of cane, occasional amb without AD; keeps a cane in each vehicle Medication management: modified indep (bubble packs from pharmacy) Financial management: shared task between spouse and pt Handwriting: 100% legible  MOBILITY STATUS:  see above  POSTURE COMMENTS:  Sitting balance:  WNL  ACTIVITY TOLERANCE: Activity  tolerance: WFL for tasks assessed  FUNCTIONAL OUTCOME MEASURES: Eval:  MAM-20 for Musculoskeletal Conditions: 61/80   UPPER EXTREMITY ROM:  BUEs WNL  UPPER EXTREMITY MMT:     MMT Right eval Left eval  Shoulder flexion 4+ 4+  Shoulder abduction 4+ 4+  Shoulder adduction    Shoulder extension    Shoulder internal rotation    Shoulder external rotation    Middle trapezius    Lower trapezius    Elbow flexion 4+ 4+  Elbow extension 4+ 4+  Wrist flexion 5 4  Wrist extension 5 4  Wrist ulnar deviation 5 4+  Wrist radial deviation 4 4+  Wrist pronation 4+ 4  Wrist supination 4+ 4  (Blank rows = not tested)  HAND FUNCTION: Grip strength: Right: 62 lbs; Left: 37 lbs, Lateral pinch: Right: 14 lbs, Left: 9 lbs, and 3 point pinch: Right: 13 lbs, Left: 9 lbs  COORDINATION: 9 Hole Peg test: Right: 26 sec; Left: 28 sec  SENSATION: Light touch: Impaired , numbness/tingling in ulnar nerve distribution of BUEs L>R  EDEMA: No visible edema  MUSCLE TONE: RUE: Within functional limits and LUE: Within functional limits  COGNITION: Overall cognitive status: Within functional limits for tasks assessed  VISION: wears bifocals at baseline  PERCEPTION: Not tested  PRAXIS: WFL  OBSERVATIONS:  Pt. pleasant, cooperative, and eager to work towards goals in OT poc.                                                                                             TREATMENT DATE: 01/05/24 Therapeutic Ex:  -Reviewed/completed full arm ulnar nerve glides R and L, and ulnar nerve flossing exercises, min visual and vc. -L grip strengthening using hand gripper to remove jumbo pegs from pegboard: hand gripper set at 23.4# for 3 trials total.  Instructed in long arm wrist extension stretch between each trial.  -Left lateral and 3pt pinch strengthening using yellow, red, green, blue, and black level resistive clips for 2 trials each pinch type and color. -L wrist and forearm strengthening: 2# dumbbell with  forearm resting off incline wedge for 2 sets 10 reps each  -wrist ext  -wrist flex  -wrist RD  -forearm pron/sup  -Transitioned to 1 more set of 10 reps or resistance for above noted  exercises using red theraband, as pt does not have a dumbbell at home.  Issued visual handout, but kept in clinic to review next session.  PATIENT EDUCATION: Education details: HEP progression Person educated: Patient Education method: Explanation and Verbal cues, visual handout, demo Education comprehension: verbalized understanding, further training needed  HOME EXERCISE PROGRAM: Initiated pink theraputty exercises; further training, L wrist and forearm strengthening with red theraband  GOALS: Goals reviewed with patient? Yes  SHORT TERM GOALS: Target date: 01/08/24  Pt will be indep to perform HEP for improving L hand strength for daily tasks. Baseline: Eval: Initiated theraputty exercises at eval Goal status: INITIAL  2.  Pt will be indep to verbalize 2-3 joint protection/cumulative trauma prevention strategies to reduce paresthesias in BUEs.  Baseline: Eval: Initiated educ on UE positioning during sleep/rest; further training needed Goal status: INITIAL  LONG TERM GOALS: Target date: 01/29/24  Pt will increase MAM-20 score by 9 or more points to indicate improvement in self perceived performance in [facility] components of daily tasks. Baseline: Eval: 61/80 Goal status: INITIAL  2.  Pt will increase L grip strength by 10 or more lbs in order to improve ability to open new jars/containers. Baseline: Eval: L grip strength 37 lbs (R 62 lbs) Goal status: INITIAL  3.  Pt will increase L lateral pinch strength by 3 or more lbs in order to improve efficiency with clipping R hand fingernails. Baseline: Eval: L 9 lbs (R 14 lbs) Goal status: INITIAL  4.  Pt will increase LUE distal strength by 1/2 muscle grade in order to improve ability to lift and carry heavy ADL supplies. Baseline: Eval: L wrist flex/ext  4/5 (R 5/5), L elbow flex/ext 4/5 (R 5/5) Goal status: INITIAL  ASSESSMENT:  CLINICAL IMPRESSION: Pt reports inconsistent use of theraputty at home, as she sometimes is forgetful to use it.  OT encouraged pt setting putty out where she commonly sits at home as a visual reminder for use.  Pt agreed.  Good tolerance to above noted exercises this date.  Transitioned from 2# dumbbells to using red theraband for L wrist and forearm strengthening exercises noted above, as pt does not have a dumbbell at home.  Issued visual handout, but kept in clinic to review theraband exercises next session before carryover at home.  Pt continues to work towards goals to increase LUE strength and reduce paresthesias in BUEs in order to improve performance with above noted tasks.  Pt will benefit from skilled OT to address above noted deficits and work towards goals in OT poc.   PERFORMANCE DEFICITS: in functional skills including ADLs, IADLs, coordination, dexterity, sensation, strength, Fine motor control, body mechanics, decreased knowledge of precautions, decreased knowledge of use of DME, and UE functional use, cognitive skills including memory, and psychosocial skills including coping strategies, environmental adaptation, habits, and routines and behaviors.   IMPAIRMENTS: are limiting patient from ADLs, IADLs, rest and sleep, and leisure.   CO-MORBIDITIES: has co-morbidities such as s/p liver transplant, anxiety, depression, bipolar, lymphoma  that affects occupational performance. Patient will benefit from skilled OT to address above impairments and improve overall function.  MODIFICATION OR ASSISTANCE TO COMPLETE EVALUATION: No modification of tasks or assist necessary to complete an evaluation.  OT OCCUPATIONAL PROFILE AND HISTORY: Detailed assessment: Review of records and additional review of physical, cognitive, psychosocial history related to current functional performance.  CLINICAL DECISION MAKING:  Moderate - several treatment options, min-mod task modification necessary  REHAB POTENTIAL: Good  EVALUATION COMPLEXITY: Moderate  PLAN:  OT FREQUENCY: 2x/week  OT DURATION: 6 weeks  PLANNED INTERVENTIONS: 97168 OT Re-evaluation, 97535 self care/ADL training, 16109 therapeutic exercise, 97530 therapeutic activity, 97112 neuromuscular re-education, 97140 manual therapy, 97010 moist heat, 97010 cryotherapy, 97034 contrast bath, 97760 Orthotics management and training, 60454 Splinting (initial encounter), passive range of motion, psychosocial skills training, coping strategies training, patient/family education, and DME and/or AE instructions  RECOMMENDED OTHER SERVICES: Pt evaluated by PT this date to address bilat hip pain  CONSULTED AND AGREED WITH PLAN OF CARE: Patient  PLAN FOR NEXT SESSION: see above  Danelle Earthly, MS, OTR/L  01/05/2024, 3:45 PM

## 2024-01-05 NOTE — Progress Notes (Signed)
 BP 118/78   Temp 98.3 F (36.8 C)   Resp 18   Ht 5\' 6"  (1.676 m)   Wt 252 lb 8 oz (114.5 kg)   LMP 10/16/2021 (Exact Date) Comment: Seen by OB GYN on 10/16/21 for 6 days of excessive bleeding as a post menapause  SpO2 96%   BMI 40.75 kg/m    Subjective:    Patient ID: Alison [name], female    DOB: Jun 01, 1976, 48 y.o.   MRN: [id number]  HPI: Alison [name] is a 48 y.o. female  Chief Complaint  Patient presents with   Breast Mass    right    Discussed the use of AI scribe software for clinical note transcription with the patient, who gave verbal consent to proceed.  History of Present Illness Alison [name] is a 48 year old female who presents with a mass in her right breast.  She discovered a mass in her right breast this morning. There is no associated pain, redness, or discharge. No changes in the skin overlying the mass or in the nipple. No history of fever or systemic symptoms. She has not had a mammogram in two years.  She has a history of previous abnormalities in the right breast, though details of these abnormalities are not specified. There is no known family history of breast cancer.          01/05/2024    9:50 AM 12/29/2023    2:18 PM 11/04/2023    1:15 PM  Depression screen PHQ 2/9  Decreased Interest 0 0   Down, Depressed, Hopeless 0 1   PHQ - 2 Score 0 1   Altered sleeping 0    Tired, decreased energy 0    Change in appetite 0    Feeling bad or failure about yourself  0    Trouble concentrating 0    Moving slowly or fidgety/restless 0    Suicidal thoughts 0    PHQ-9 Score 0    Difficult doing work/chores Not difficult at all       Information is confidential and restricted. Go to Review Flowsheets to unlock data.    Relevant past medical, surgical, family and social history reviewed and updated as indicated. Interim medical history since our last visit reviewed. Allergies and medications reviewed and updated.  Review of Systems  Ten  systems reviewed and is negative except as mentioned in HPI      Objective:    BP 118/78   Temp 98.3 F (36.8 C)   Resp 18   Ht 5\' 6"  (1.676 m)   Wt 252 lb 8 oz (114.5 kg)   LMP 10/16/2021 (Exact Date) Comment: Seen by OB GYN on 10/16/21 for 6 days of excessive bleeding as a post menapause  SpO2 96%   BMI 40.75 kg/m    Wt Readings from Last 3 Encounters:  01/05/24 252 lb 8 oz (114.5 kg)  12/05/23 252 lb 4.8 oz (114.4 kg)  11/17/23 253 lb (114.8 kg)    Physical Exam Vitals reviewed. Exam conducted with a chaperone present.  Constitutional:      Appearance: Normal appearance.  HENT:     Head: Normocephalic.  Cardiovascular:     Rate and Rhythm: Normal rate.  Pulmonary:     Effort: Pulmonary effort is normal.  Chest:  Breasts:    Right: Mass present.    Neurological:     General: No focal deficit present.     Mental Status: She is alert  and oriented to person, place, and time. Mental status is at baseline.  Psychiatric:        Mood and Affect: Mood normal.        Behavior: Behavior normal.        Thought Content: Thought content normal.        Judgment: Judgment normal.     Results for orders placed or performed in visit on 12/05/23  Lactate dehydrogenase   Collection Time: 12/05/23 10:04 AM  Result Value Ref Range   LDH 146 98 - 192 U/L  CBC with Differential/Platelet   Collection Time: 12/05/23 10:04 AM  Result Value Ref Range   WBC 5.9 4.0 - 10.5 K/uL   RBC 4.50 3.87 - 5.11 MIL/uL   Hemoglobin 13.1 12.0 - 15.0 g/dL   HCT 56.3 87.5 - 64.3 %   MCV 84.9 80.0 - 100.0 fL   MCH 29.1 26.0 - 34.0 pg   MCHC 34.3 30.0 - 36.0 g/dL   RDW 32.9 51.8 - 84.1 %   Platelets 109 (L) 150 - 400 K/uL   nRBC 0.0 0.0 - 0.2 %   Neutrophils Relative % 45 %   Neutro Abs 2.7 1.7 - 7.7 K/uL   Lymphocytes Relative 39 %   Lymphs Abs 2.3 0.7 - 4.0 K/uL   Monocytes Relative 7 %   Monocytes Absolute 0.4 0.1 - 1.0 K/uL   Eosinophils Relative 7 %   Eosinophils Absolute 0.4 0.0 -  0.5 K/uL   Basophils Relative 1 %   Basophils Absolute 0.0 0.0 - 0.1 K/uL   Immature Granulocytes 1 %   Abs Immature Granulocytes 0.03 0.00 - 0.07 K/uL       Assessment & Plan:   Problem List Items Addressed This Visit   None Visit Diagnoses       Mass of upper inner quadrant of right breast    -  Primary   Relevant Orders   MM 3D DIAGNOSTIC MAMMOGRAM BILATERAL BREAST   US BREAST COMPLETE UNI LEFT INC AXILLA   [place] LIMITED ULTRASOUND INCLUDING AXILLA RIGHT BREAST        Assessment and Plan Assessment & Plan Breast Mass A newly discovered mass in the right breast, with previous abnormalities on the same side. Last mammogram was two years ago. Differential diagnosis includes benign breast conditions such as cysts or fibroadenomas, but malignancy cannot be ruled out without further imaging. Immediate imaging is necessary to determine the nature of the mass. - Order diagnostic mammogram and breast ultrasound - Advise to schedule imaging appointments promptly - Instruct to follow up with results        Follow up plan: Return if symptoms worsen or fail to improve.

## 2024-01-07 ENCOUNTER — Ambulatory Visit: Admitting: Occupational Therapy

## 2024-01-07 ENCOUNTER — Ambulatory Visit: Admitting: Physical Therapy

## 2024-01-07 DIAGNOSIS — F331 Major depressive disorder, recurrent, moderate: Secondary | ICD-10-CM | POA: Diagnosis not present

## 2024-01-07 DIAGNOSIS — F4310 Post-traumatic stress disorder, unspecified: Secondary | ICD-10-CM | POA: Diagnosis not present

## 2024-01-08 ENCOUNTER — Encounter

## 2024-01-08 ENCOUNTER — Encounter: Admitting: Physical Therapy

## 2024-01-08 ENCOUNTER — Ambulatory Visit: Admitting: "Endocrinology

## 2024-01-12 ENCOUNTER — Ambulatory Visit: Admitting: Occupational Therapy

## 2024-01-12 ENCOUNTER — Ambulatory Visit: Admitting: Physical Therapy

## 2024-01-12 DIAGNOSIS — M6281 Muscle weakness (generalized): Secondary | ICD-10-CM | POA: Diagnosis not present

## 2024-01-12 DIAGNOSIS — M25552 Pain in left hip: Secondary | ICD-10-CM | POA: Diagnosis not present

## 2024-01-12 DIAGNOSIS — R278 Other lack of coordination: Secondary | ICD-10-CM | POA: Insufficient documentation

## 2024-01-12 DIAGNOSIS — R262 Difficulty in walking, not elsewhere classified: Secondary | ICD-10-CM

## 2024-01-12 DIAGNOSIS — M25551 Pain in right hip: Secondary | ICD-10-CM

## 2024-01-12 NOTE — Therapy (Signed)
 OUTPATIENT PHYSICAL THERAPY LOWER EXTREMITY TREATMENT   Patient Name: Alison [name] MRN: [id number] DOB:April 07, 1976, 48 y.o., female Today's Date: 01/12/2024  END OF SESSION:   PT End of Session - 01/12/24 0744     Visit Number 4    Number of Visits 16    Date for PT Re-Evaluation 02/12/24    PT Start Time 0803    PT Stop Time 0844    PT Time Calculation (min) 41 min    Equipment Utilized During Treatment Gait belt    Activity Tolerance Patient limited by fatigue    Behavior During Therapy WFL for tasks assessed/performed                Past Medical History:  Diagnosis Date   Abnormal uterine bleeding    Allergy    Anxiety    Arthritis    Bipolar disorder (manic depression) (HCC)    Chronic kidney failure    Chronic renal disease, stage III (HCC)    Diabetes mellitus without complication (HCC)    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HCC) 2015   Right axillary lymph node resected and chemo tx's.   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 02/2023 Echo: EF 55-60%, no rwma, nl RV size/fxn. Mildly dil LA.   Personality disorder (HCC)    Pineal gland cyst    Post herpetic neuralgia    PTSD (post-traumatic stress disorder)    Renal disorder    S/P liver transplant (HCC) 1993   Sleep apnea    has not recieved cpap yet-other one was recalled   Past Surgical History:  Procedure Laterality Date   BONE MARROW BIOPSY  01/13/2015   BREAST BIOPSY  12/2014   BREAST BIOPSY  2011   BREAST SURGERY     CHOLECYSTECTOMY     COLONOSCOPY     COLONOSCOPY WITH PROPOFOL  N/A 02/28/2022   Procedure: COLONOSCOPY WITH PROPOFOL;  Surgeon: Midge Minium, MD;  Location: [facility] END[facility];  Service: Endoscopy;  Laterality: N/A;   ESOPHAGOGASTRODUODENOSCOPY     ESOPHAGOGASTRODUODENOSCOPY (EGD) WITH PROPOFOL N/A 01/09/2021   Procedure: ESOPHAGOGASTRODUODENOSCOPY (EGD) WITH PROPOFOL;  Surgeon: Midge Minium, MD;  Location: [facility] END[facility];  Service: Endoscopy;  Laterality: N/A;   HERNIA REPAIR     x4-all from liver transplant   HYSTEROSCOPY WITH D & C N/A 11/13/2021   Procedure: DILATATION AND CURETTAGE /HYSTEROSCOPY;  Surgeon: Natale Milch, MD;  Location: [facility] ORS;  Service: Gynecology;  Laterality: N/A;   infusaport     LIVER TRANSPLANT  12/17/1991   LUMBAR PUNCTURE     PORT A CATH INJECTION ([facility] HX)     RENAL BIOPSY     tumor removal  2015   cancer right side   Patient Active Problem List   Diagnosis Date Noted   Weakness 10/16/2023   Flu 10/16/2023   SIRS (systemic inflammatory response syndrome) (HCC) 10/15/2023   Elevated serum creatinine 10/15/2023  Myalgia 10/15/2023   Elevated LFTs 10/15/2023   Acute idiopathic pericarditis 03/09/2023   Acute pericarditis 03/08/2023   Sepsis (HCC) 03/06/2023   Chest pain 03/06/2023   CKD (chronic kidney disease), stage II 01/22/2023   Insulin dependent type 2 diabetes mellitus (HCC) 01/22/2023   Overactive bladder 01/22/2023   High risk medication use 12/12/2022   Sjogren's syndrome (HCC) 05/27/2022   Menorrhagia with irregular cycle    Akathisia 10/24/2021   At risk for prolonged QT interval syndrome 09/12/2021   Prolonged Q-T interval on ECG 08/29/2021   Insomnia 07/20/2021   Bipolar disorder, in full remission, most recent episode mixed (HCC) 07/20/2021   GAD (generalized anxiety disorder) 05/09/2021   Opioid dependence with opioid-induced disorder (HCC) 12/20/2020   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   Carpal tunnel syndrome, left 07/26/2019   Cubital tunnel syndrome on left 07/26/2019    Umbilical hernia without obstruction and without gangrene 06/17/2019   Lumbar spondylosis 06/02/2019   Obesity (BMI 35.0-39.9 without comorbidity) 06/02/2019   Goals of care, counseling/discussion 05/18/2019   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   Fibromyalgia 03/23/2019   Systemic lupus erythematosus (SLE) in adult [facility]) 03/23/2019   Essential hypertension 03/23/2019   Mass of left kidney 03/23/2019   Nausea without vomiting 03/23/2019   Hyperlipidemia 03/23/2019   Osteoarthritis 03/23/2019   Trigeminal neuralgia 03/23/2019   Nephrolithiasis 03/23/2019   Migraine 03/23/2019   OSA on CPAP 03/23/2019   Fibrocystic breast 03/23/2019   Heart murmur 03/23/2019   Major depressive disorder, recurrent (HCC) 03/18/2017   Morbid obesity (HCC) 03/18/2017   Neuropathic pain 11/19/2016   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HCC) 01/13/2015   Lumbar disc disease with radiculopathy 07/26/2013   S/P liver transplant (HCC) 07/26/2013   Facial nerve disorder 06/29/2012   Low back pain 12/26/2010    PCP: [name] FNP  REFERRING PROVIDER: Janice Coffin PA  REFERRING DIAG: hip pain, bilateral  THERAPY DIAG:  Muscle weakness (generalized)  Difficulty in walking, not elsewhere classified  Pain of both hip joints  Other lack of coordination  Rationale for Evaluation and Treatment: Rehabilitation  ONSET DATE: 2024  SUBJECTIVE:   SUBJECTIVE STATEMENT:     Pt reports she is sore from a weekend in [place] 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 Pt reports due to having a busy weekend she was unable to perform her HEP.   PERTINENT HISTORY: Patient presents for evaluation of bilateral hip pain. She additionally  has PMH of ulnar neuropathy, migraines, trigeminal neuralgia, bilateral resting  tremors, fibromyalgia, lupus, lymphoma, gout, depression, bipolar disorder, DM, GERD, anxiety, chronic renal disease stage III, liver transplant (27 years ago for lupoid hepatitis), and chemotherapy.  Patient reports she can walk for a little bit then her hips hurt and then she has to sit for a long period.   Reports it has been getting worse. When she steps wrong it gets worse.  PAIN:  Are you having pain? Yes: NPRS scale: worst: 8/10, least pain: 0/10 Pain location: deep inside hip region Pain description: pinching pain Aggravating factors: walking, stepping wrong Relieving factors: rest  PRECAUTIONS: Fall  RED FLAGS: None   WEIGHT BEARING RESTRICTIONS: No  FALLS:  Has patient fallen in last 6 months? Yes. Number of falls 2  LIVING ENVIRONMENT: Lives with: lives  with their spouse Lives in: House/apartment Stairs:  elevator Has following equipment at home: Single point cane, Environmental consultant - 4 wheeled, Wheelchair (power), Wheelchair (manual), and shower chair  OCCUPATION: disability;   Patient enjoys crafting, beadwork, jewelry, applicate, sewing.   PLOF: Independent with household mobility with device  PATIENT GOALS: to figure out what's triggering the pain, decrease pain  NEXT MD VISIT: patient not sure  OBJECTIVE:  Note: Objective measures were completed at Evaluation unless otherwise noted.  DIAGNOSTIC FINDINGS:   MRI T-Spine with and without contrast 05/18/2018: Moderate multilevel spondylosis and associated disc changes most significantly at T11-12, no abnormal cord signal, as detailed above. This study was compared to the patient's previous one dated 11/19/2017, stable findings.  MRI C-Spine 03/05/2018: Mild spondylosis and associated disc changes of the cervical and imaged thoracic spine, most significantly at T2-3, as detailed above. No intrinsic cord lesion to suggest multiple sclerosis. Left thyroid gland peripherally enhancing nodule recommend further evaluation should  this be a new finding, such as with ultrasound.This study was compared to the patient's previous one dated 03/30/2010, mild worsening at C5-6, previous study was limited to sagittal plane only.  MRI Brain 03/05/2018: Incidental note of partial opacification of the right sphenoid sinus. Vein adjacent and lateral to the left trigeminal nerve, no indentation or impingement, perpendicular to the nerve itself, doubtful clinical significance. This study was compared to the patient's previous one dated 08/31/2014, stable findings.   PATIENT SURVEYS:  LEFS 42  COGNITION: Overall cognitive status:  history of short term memory problems       SENSATION: WFL;   POSTURE: posterior pelvic tilt and flexed trunk   PALPATION: No tenderness to palpation   LOWER EXTREMITY ROM: WFL:  Slight lack of motion in hip extension bilaterally LOWER EXTREMITY MMT:  MMT Right eval Left eval  Hip flexion 4- 4-  Hip extension    Hip abduction 3 3  Hip adduction 3- 3-  Hip internal rotation    Hip external rotation    Knee flexion 3 3  Knee extension 3+ 3+  Ankle dorsiflexion 4- 4-  Ankle plantarflexion 4- 4-  Ankle inversion    Ankle eversion     (Blank rows = not tested)  LOWER EXTREMITY SPECIAL TESTS:  Hip special tests: Luisa Hart (FABER) test: positive , SI compression test: negative, SI distraction test: negative, Hip scouring test: positive , and Anterior hip impingement test: positive   FUNCTIONAL TESTS:  5 times sit to stand: 25.5 seconds with hands on plinth table  10 meter walk test: 8 seconds   GAIT: Distance walked: 40 ft Assistive device utilized: None Level of assistance: CGA Comments: decreased step length                                                                                                                                TREATMENT DATE: 01/12/24    B LE and B UE reciprocal movement pattern retraining  and endurance training on Nustep against level 2 resistance for 6  minutes with pt maintaining SPM (steps per minute) ~35; however, goal of 45 but pt unable to sustain due to pt being fatigued this morning. Reached 206 steps   B LE strengthening including:  - supine bridges 2x 15 reps - supine bridge with level 3 green theraband resistance around knees x15 reps - sidelying clamshells against level 3 green theraband resistance 2 x12reps each side- pt with increased weakness on R side compared to L with pt reporting she feels the muscle burn  Sit<>stands from EOM x10reps progressed to 3lb dumbbells in each hand and performing modified squats of tapping hips on EOM 2x 10 reps - providing cuing for proper form/technique  Side stepping down/back 2x 8 steps each with level 2 red theraband resistance around knees - requires cuing for proper form/technique  Forward step-ups on green step with 1x purple plate 2 [id number] per LE progressed to holding contralateral LE in the air for added balance challenge with pt requiring up to min A for this, improved slightly on 2nd set. *Pt reports she has difficulty with steps  Lateral side stepping on/over green step with 1x purple plate x5 reps each direction  Therapist educated pt on recommendation to perform HEP tomorrow to allow follow-up at next visit on need to progress/modify prescribed exercises.   PATIENT EDUCATION:  Education details: goals, POC HEP  Person educated: Patient Education method: Explanation, Demonstration, Tactile cues, and Verbal cues Education comprehension: verbalized understanding, returned demonstration, verbal cues required, tactile cues required, and needs further education  HOME EXERCISE PROGRAM: Access Code: [email or web address] URL: [email or web address] Date: 12/18/23 --> updated 01/05/2024 by Maylon Peppers  Prepared by: Precious Bard   Exercises - Leg Extension  - 1 x daily - 3-4 x weekly - 2 sets - 10 reps - 5 hold - Seated Hip Adduction Isometrics with Ball  - 1 x daily - 3-4 x weekly  - 2 sets - 10 reps - 5 hold - Seated Hip Abduction with Resistance  - 1 x daily - 3-4 x weekly - 2 sets - 10 reps - 5 hold - Standing Hip Abduction with Counter Support  - 1 x daily - 3-4 x weekly - 3 sets - 10 reps - Standing March with Counter Support  - 1 x daily - 3-4 x weekly - 3 sets - 10 reps - Heel Raises with Counter Support  - 1 x daily - 3-4 x weekly - 3 sets - 10 reps - Sit to Stand with Arms Crossed  - 1 x daily - 3-4 x weekly - 3 sets - 10 reps   ASSESSMENT:  CLINICAL IMPRESSION:   Patient arrives motivated to participate and puts forth excellent effort throughout session. Therapy session focused on functional B LE strengthening with progression of increased resistance to improve pt's independence and decrease pain with functional mobility including coming to stand and stepping up on stairs. Pt with balance instability during step-ups when attempting to hold contralateral LE up for single-limb-stance and would benefit from progression of this intervention. Patient will benefit from skilled physical therapy to reduce pain, improve functional mobility, and improve quality of life.   OBJECTIVE IMPAIRMENTS: Abnormal gait, decreased activity tolerance, decreased endurance, decreased mobility, difficulty walking, decreased strength, hypomobility, impaired perceived functional ability, impaired flexibility, improper body mechanics, postural dysfunction, obesity, and pain.   ACTIVITY LIMITATIONS: bending, standing, squatting, stairs, transfers, locomotion level, and caring for others  PARTICIPATION LIMITATIONS: meal prep, cleaning,  laundry, shopping, and community activity  PERSONAL FACTORS: Age, Fitness, Past/current experiences, Time since onset of injury/illness/exacerbation, and 3+ comorbidities: ulnar neuropathy, migraines, trigeminal neuralgia, bilateral resting tremors, fibromyalgia, lupus, lymphoma, gout, depression, bipolar disorder, DM, GERD, anxiety, chronic renal disease stage  III, liver transplant (27 years ago for lupoid hepatitis), and chemotherapy.   are also affecting patient's functional outcome.   REHAB POTENTIAL: Good  CLINICAL DECISION MAKING: Evolving/moderate complexity  EVALUATION COMPLEXITY: Moderate   GOALS: Goals reviewed with patient? Yes  SHORT TERM GOALS: Target date: 01/15/2024   Patient will be independent in home exercise program to improve strength/mobility for better functional independence with ADLs. Baseline: 3/13: HEP given Goal status: INITIAL    LONG TERM GOALS: Target date: 02/12/2024    Patient (< 73 years old) will complete five times sit to stand test in < 10 seconds indicating an increased LE strength and improved balance. Baseline: 25.5 seconds with UE support Goal status: INITIAL  2.  Patient will be able to ambulate 2000 steps and/or >6 minutes prior to pain increase allowing for improved community ambulation  Baseline: 3/13: 1000 steps prior to pain Goal status: INITIAL  3.  Patient will report a worst pain of 3/10 on VAS in bilateral hips to improve tolerance with ADLs and reduced symptoms with activities.  Baseline: 3/13: 8/10  Goal status: INITIAL  4.  Patient will increase lower extremity functional scale to >60/80 to demonstrate improved functional mobility and increased tolerance with ADLs.  Baseline: 3/13: 42 Goal status: INITIAL    PLAN:  PT FREQUENCY: 2x/week  PT DURATION: 8 weeks  PLANNED INTERVENTIONS: 97164- PT Re-evaluation, 97110-Therapeutic exercises, 97530- Therapeutic activity, 97112- Neuromuscular re-education, 97535- Self Care, 40981- Manual therapy, [phone]- Gait training, [phone]- Orthotic Fit/training, [phone]- Splinting, [phone]- Electrical stimulation (unattended), [phone]- Electrical stimulation (manual), 97016- Vasopneumatic device, [id number]- Ultrasound, 52841- Traction (mechanical), Patient/Family education, Balance training, Stair training, Taping, Dry Needling, Joint mobilization, Spinal  mobilization, Compression bandaging, Vestibular training, Visual/preceptual remediation/compensation, DME instructions, Wheelchair mobility training, Cryotherapy, and Moist heat  PLAN FOR NEXT SESSION:  - hip strengthening - upgrade HEP with resistance bands as appropriate  - progress step-ups with promotion of single-limb-stance for balance challenge   Casimiro Needle, PT, DPT, NCS, CSRS Physical Therapist - [facility]  [facility]  8:54 AM 01/12/24

## 2024-01-12 NOTE — Therapy (Signed)
 OUTPATIENT OCCUPATIONAL THERAPY NEURO TREATMENT NOTE  Patient Name: Alison [name] MRN: [id number] DOB:1975/12/18, 48 y.o., female Today's Date: 01/12/2024  PCP: [name], FNP REFERRING PROVIDER: Janice Coffin, PA-C  END OF SESSION:  OT End of Session - 01/12/24 1029     Visit Number 4    Number of Visits 12    Date for OT Re-Evaluation 01/29/24    OT Start Time 0845    OT Stop Time 0930    OT Time Calculation (min) 45 min    Activity Tolerance Patient tolerated treatment well    Behavior During Therapy WFL for tasks assessed/performed            Past Medical History:  Diagnosis Date   Abnormal uterine bleeding    Allergy    Anxiety    Arthritis    Bipolar disorder (manic depression) (HCC)    Chronic kidney failure    Chronic renal disease, stage III (HCC)    Diabetes mellitus without complication (HCC)    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HCC) 2015   Right axillary lymph node resected and chemo tx's.   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 02/2023 Echo: EF 55-60%, no rwma, nl RV size/fxn. Mildly dil LA.   Personality disorder (HCC)    Pineal gland cyst    Post herpetic neuralgia    PTSD (post-traumatic stress disorder)    Renal disorder    S/P liver transplant (HCC) 1993   Sleep apnea    has not recieved cpap yet-other one was recalled   Past Surgical History:  Procedure Laterality Date   BONE MARROW BIOPSY  01/13/2015   BREAST BIOPSY  12/2014   BREAST BIOPSY  2011   BREAST SURGERY     CHOLECYSTECTOMY     COLONOSCOPY     COLONOSCOPY  WITH PROPOFOL N/A 02/28/2022   Procedure: COLONOSCOPY WITH PROPOFOL;  Surgeon: Midge Minium, MD;  Location: [facility];  Service: Endoscopy;  Laterality: N/A;   ESOPHAGOGASTRODUODENOSCOPY     ESOPHAGOGASTRODUODENOSCOPY (EGD) WITH PROPOFOL N/A 01/09/2021   Procedure: ESOPHAGOGASTRODUODENOSCOPY (EGD) WITH PROPOFOL;  Surgeon: Midge Minium, MD;  Location: [facility];  Service: Endoscopy;  Laterality: N/A;   HERNIA REPAIR     x4-all from liver transplant   HYSTEROSCOPY WITH D & C N/A 11/13/2021   Procedure: DILATATION AND CURETTAGE /HYSTEROSCOPY;  Surgeon: Natale Milch, MD;  Location: [facility];  Service: Gynecology;  Laterality: N/A;   infusaport     LIVER TRANSPLANT  12/17/1991   LUMBAR PUNCTURE     PORT A CATH INJECTION ([facility] HX)     RENAL BIOPSY     tumor removal  2015   cancer right side   Patient Active Problem List   Diagnosis Date Noted   Weakness 10/16/2023   Flu 10/16/2023   SIRS (systemic inflammatory response syndrome) (HCC) 10/15/2023   Elevated serum creatinine 10/15/2023   Myalgia 10/15/2023  Elevated LFTs 10/15/2023   Acute idiopathic pericarditis 03/09/2023   Acute pericarditis 03/08/2023   Sepsis (HCC) 03/06/2023   Chest pain 03/06/2023   CKD (chronic kidney disease), stage II 01/22/2023   Insulin dependent type 2 diabetes mellitus (HCC) 01/22/2023   Overactive bladder 01/22/2023   High risk medication use 12/12/2022   Sjogren's syndrome (HCC) 05/27/2022   Menorrhagia with irregular cycle    Akathisia 10/24/2021   At risk for prolonged QT interval syndrome 09/12/2021   Prolonged Q-T interval on ECG 08/29/2021   Insomnia 07/20/2021   Bipolar disorder, in full remission, most recent episode mixed (HCC) 07/20/2021   GAD (generalized anxiety disorder) 05/09/2021   Opioid dependence with opioid-induced disorder (HCC) 12/20/2020   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   Carpal tunnel syndrome, left 07/26/2019   Cubital tunnel syndrome on  left 07/26/2019   Umbilical hernia without obstruction and without gangrene 06/17/2019   Lumbar spondylosis 06/02/2019   Obesity (BMI 35.0-39.9 without comorbidity) 06/02/2019   Goals of care, counseling/discussion 05/18/2019   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   Fibromyalgia 03/23/2019   Systemic lupus erythematosus (SLE) in adult [facility]) 03/23/2019   Essential hypertension 03/23/2019   Mass of left kidney 03/23/2019   Nausea without vomiting 03/23/2019   Hyperlipidemia 03/23/2019   Osteoarthritis 03/23/2019   Trigeminal neuralgia 03/23/2019   Nephrolithiasis 03/23/2019   Migraine 03/23/2019   OSA on CPAP 03/23/2019   Fibrocystic breast 03/23/2019   Heart murmur 03/23/2019   Major depressive disorder, recurrent (HCC) 03/18/2017   Morbid obesity (HCC) 03/18/2017   Neuropathic pain 11/19/2016   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HCC) 01/13/2015   Lumbar disc disease with radiculopathy 07/26/2013   S/P liver transplant (HCC) 07/26/2013   Facial nerve disorder 06/29/2012   Low back pain 12/26/2010   ONSET DATE: 10/08/2023  REFERRING DIAG: Z61.09 (ICD-10-CM) - Ulnar neuropathy of both upper extremities   THERAPY DIAG:  Muscle weakness (generalized)  Other lack of coordination  Rationale for Evaluation and Treatment: Rehabilitation  SUBJECTIVE:  SUBJECTIVE STATEMENT:  Pt. reports they went to [place] this weekend to take the support pets to people affected by [facility]. Pt accompanied by: self  PERTINENT HISTORY: Per MEDICAL RECORD NUMBERAssessment & Plan Ulnar Neuropathy Recurrent ulnar neuropathy with numbness, tingling in the last two fingers, muscle atrophy, and hand weakness for one month. Previous ulnar nerve entrapment resolved with exercises. Discussed occupational therapy and orthopedic evaluation for nerve release or injections.  - Refer to occupational therapy for hand exercises and ulnar nerve management - Refer to [facility]  - Follow up  in 4-6 months to monitor progress and reassess if symptoms worsen. Return precautions   PRECAUTIONS: Fall  WEIGHT BEARING RESTRICTIONS: No  PAIN: Are you having pain? 4.5/10 in bilateral hands from prolonged leash holding this weekend for an 85# pulling dog.  FALLS: Has patient fallen in last 6 months? Yes. Number of falls 2 "serious falls"   LIVING ENVIRONMENT: Lives with: lives with their family, spouse and spouse's brother Lives in: 3rd floor apartment with elevator, 2 bedrooms Stairs: No Has following equipment at home: cane, rollator, power chair for long distance   PLOF: Independent  PATIENT GOALS: "I would like to try to un-entrap this nerve and gain back strength in my L hand."  OBJECTIVE:  Note: Objective measures were completed at Evaluation unless otherwise noted.  HAND DOMINANCE: Right  ADLs: Overall ADLs: BUE weakness and numbness contribute to increased difficulty with [facility] components of ADL/IADLs  Transfers/ambulation related to ADLs: modified indep-indep Eating: increased difficulty with cutting vegetables  Grooming: indep UB Dressing: indep LB Dressing: indep Toileting: indep Bathing: modified indep Tub Shower transfers: modified indep with shower chair Equipment: Shower seat with back  IADLs: Shopping: modified indep  Light housekeeping: indep Meal Prep: difficulty opening jars/containers, difficulty with prolonged standing; brother-in-law typically does the meal prep Community mobility: power chair for long distances (ie the zoo), occasional use of cane, occasional amb without AD; keeps a cane in each vehicle Medication management: modified indep (bubble packs from pharmacy) Financial management: shared task between spouse and pt Handwriting: 100% legible  MOBILITY STATUS:  see above  POSTURE COMMENTS:  Sitting balance:  WNL  ACTIVITY TOLERANCE: Activity tolerance: WFL for tasks assessed  FUNCTIONAL OUTCOME MEASURES: Eval:  MAM-20 for  Musculoskeletal Conditions: 61/80   UPPER EXTREMITY ROM:  BUEs WNL  UPPER EXTREMITY MMT:     MMT Right eval Left eval  Shoulder flexion 4+ 4+  Shoulder abduction 4+ 4+  Shoulder adduction    Shoulder extension    Shoulder internal rotation    Shoulder external rotation    Middle trapezius    Lower trapezius    Elbow flexion 4+ 4+  Elbow extension 4+ 4+  Wrist flexion 5 4  Wrist extension 5 4  Wrist ulnar deviation 5 4+  Wrist radial deviation 4 4+  Wrist pronation 4+ 4  Wrist supination 4+ 4  (Blank rows = not tested)  HAND FUNCTION: Grip strength: Right: 62 lbs; Left: 37 lbs, Lateral pinch: Right: 14 lbs, Left: 9 lbs, and 3 point pinch: Right: 13 lbs, Left: 9 lbs  COORDINATION: 9 Hole Peg test: Right: 26 sec; Left: 28 sec  SENSATION: Light touch: Impaired , numbness/tingling in ulnar nerve distribution of BUEs L>R  EDEMA: No visible edema  MUSCLE TONE: RUE: Within functional limits and LUE: Within functional limits  COGNITION: Overall cognitive status: Within functional limits for tasks assessed  VISION: wears bifocals at baseline  PERCEPTION: Not tested  PRAXIS: WFL  OBSERVATIONS:   Pt. pleasant, cooperative, and eager to work towards goals in OT poc.                                                                                             TREATMENT DATE: 01/12/24  Therapeutic Ex:   Pt. education was provided about bilateral hand tendon glide exercises. Pt. was provided with a visual handout through [employer].  Manual Therapy:  -STM to the bilateral volar aspect of the hands, palms, thenar, and hypothenar eminences. Bilateral 1st dorsal interossei, and the left MP 2/2 pain, and stiffness. -Manual therapy was performed independent of, and in preparation for therapeutic Ex.    Paraffin Bath:  -Paraffin bath to the bilateral hands with a towel wrap for 10 min. 2/2 pain, and stiffness. Paraffin Bath was performed in preparation for manual therapy, and  ROM.   PATIENT EDUCATION: Education details: Tendon glides, joint protection for farm type tasks. Person educated: Patient Education method: Explanation and Verbal cues, visual handout, demo Education comprehension: verbalized understanding, further training needed  HOME EXERCISE PROGRAM   Bilateral  tendon glide exercises  GOALS: Goals reviewed with patient? Yes  SHORT TERM GOALS: Target date: 01/08/24  Pt will be indep to perform HEP for improving L hand strength for daily tasks. Baseline: Eval: Initiated theraputty exercises at eval Goal status: INITIAL  2.  Pt will be indep to verbalize 2-3 joint protection/cumulative trauma prevention strategies to reduce paresthesias in BUEs.  Baseline: Eval: Initiated educ on UE positioning during sleep/rest; further training needed Goal status: INITIAL  LONG TERM GOALS: Target date: 01/29/24  Pt will increase MAM-20 score by 9 or more points to indicate improvement in self perceived performance in [facility] components of daily tasks. Baseline: Eval: 61/80 Goal status: INITIAL  2.  Pt will increase L grip strength by 10 or more lbs in order to improve ability to open new jars/containers. Baseline: Eval: L grip strength 37 lbs (R 62 lbs) Goal status: INITIAL  3.  Pt will increase L lateral pinch strength by 3 or more lbs in order to improve efficiency with clipping R hand fingernails. Baseline: Eval: L 9 lbs (R 14 lbs) Goal status: INITIAL  4.  Pt will increase LUE distal strength by 1/2 muscle grade in order to improve ability to lift and carry heavy ADL supplies. Baseline: Eval: L wrist flex/ext 4/5 (R 5/5), L elbow flex/ext 4/5 (R 5/5) Goal status: INITIAL  ASSESSMENT:  CLINICAL IMPRESSION:  Pt. reports that her bilateral hands are sore, and painful today 2/2 having to control an 85 lbs support dog that was constantly pulling on the leash [place]. Pt. reported 4.5/10 pain initially at the start of the  session, and improved to 3/10 by the end of the session. Pt. reported the paraffin, and massage feels nice. Pt. required cues, and assist to for proper form for tendon glide exercises. Pt. 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 Pt continues to work towards goals to increase LUE strength and reduce paresthesias in BUEs in order to improve performance with above noted tasks. Pt will benefit from skilled OT to address above noted deficits and work towards goals in OT poc.   PERFORMANCE DEFICITS: in functional skills including ADLs, IADLs, coordination, dexterity, sensation, strength, Fine motor control, body mechanics, decreased knowledge of precautions, decreased knowledge of use of DME, and UE functional use, cognitive skills including memory, and psychosocial skills including coping strategies, environmental adaptation, habits, and routines and behaviors.   IMPAIRMENTS: are limiting patient from ADLs, IADLs, rest and sleep, and leisure.   CO-MORBIDITIES: has co-morbidities such as s/p liver transplant, anxiety, depression, bipolar, lymphoma  that affects occupational performance. Patient will benefit from skilled OT to address above impairments and improve overall function.  MODIFICATION OR ASSISTANCE TO COMPLETE EVALUATION: No modification of tasks or assist necessary to complete an evaluation.  OT OCCUPATIONAL PROFILE AND HISTORY: Detailed assessment: Review of records and additional review of physical, cognitive, psychosocial history related to current functional performance.  CLINICAL DECISION MAKING: Moderate - several treatment options, min-mod task modification necessary  REHAB POTENTIAL: Good  EVALUATION COMPLEXITY: Moderate    PLAN:  OT FREQUENCY: 2x/week  OT DURATION: 6 weeks  PLANNED INTERVENTIONS: 97168 OT Re-evaluation, 97535 self care/ADL training, 16109  therapeutic exercise, 97530 therapeutic activity, 97112 neuromuscular re-education, 97140 manual therapy, 97010 moist heat, 97010 cryotherapy, 97034 contrast bath, 97760 Orthotics management and training, 60454 Splinting (initial encounter), passive range of motion, psychosocial  skills training, coping strategies training, patient/family education, and DME and/or AE instructions  RECOMMENDED OTHER SERVICES: Pt evaluated by PT this date to address bilat hip pain  CONSULTED AND AGREED WITH PLAN OF CARE: Patient  PLAN FOR NEXT SESSION: see above  Olegario Messier, MS, OTR/L   01/12/2024, 10:46 AM

## 2024-01-13 ENCOUNTER — Ambulatory Visit
Admission: RE | Admit: 2024-01-13 | Discharge: 2024-01-13 | Disposition: A | Discharge status: Home / Self Care | Source: Ambulatory Visit | Attending: Nurse Practitioner

## 2024-01-13 ENCOUNTER — Other Ambulatory Visit: Payer: Self-pay | Admitting: Nurse Practitioner

## 2024-01-13 ENCOUNTER — Ambulatory Visit
Admission: RE | Admit: 2024-01-13 | Discharge: 2024-01-13 | Disposition: A | Discharge status: Home / Self Care | Source: Ambulatory Visit | Attending: Nurse Practitioner | Admitting: Nurse Practitioner

## 2024-01-13 DIAGNOSIS — N6311 Unspecified lump in the right breast, upper outer quadrant: Secondary | ICD-10-CM | POA: Diagnosis not present

## 2024-01-13 DIAGNOSIS — N6312 Unspecified lump in the right breast, upper inner quadrant: Secondary | ICD-10-CM | POA: Insufficient documentation

## 2024-01-13 DIAGNOSIS — R928 Other abnormal and inconclusive findings on diagnostic imaging of breast: Secondary | ICD-10-CM

## 2024-01-13 DIAGNOSIS — R92323 Mammographic fibroglandular density, bilateral breasts: Secondary | ICD-10-CM | POA: Diagnosis not present

## 2024-01-14 ENCOUNTER — Ambulatory Visit: Admitting: Occupational Therapy

## 2024-01-14 ENCOUNTER — Ambulatory Visit

## 2024-01-14 ENCOUNTER — Encounter: Payer: Self-pay | Admitting: Physical Therapy

## 2024-01-14 DIAGNOSIS — M25551 Pain in right hip: Secondary | ICD-10-CM | POA: Diagnosis not present

## 2024-01-14 DIAGNOSIS — M6281 Muscle weakness (generalized): Secondary | ICD-10-CM

## 2024-01-14 DIAGNOSIS — M25552 Pain in left hip: Secondary | ICD-10-CM | POA: Diagnosis not present

## 2024-01-14 DIAGNOSIS — R262 Difficulty in walking, not elsewhere classified: Secondary | ICD-10-CM

## 2024-01-14 DIAGNOSIS — R278 Other lack of coordination: Secondary | ICD-10-CM | POA: Diagnosis not present

## 2024-01-14 NOTE — Therapy (Signed)
 OUTPATIENT PHYSICAL THERAPY LOWER EXTREMITY TREATMENT   Patient Name: Alison [name] MRN: [id number] DOB:[birth year], 48 y.o., female Today's Date: 01/14/2024  END OF SESSION:   PT End of Session - 01/14/24 0755     Visit Number 5    Number of Visits 16    Date for PT Re-Evaluation 02/12/24    PT Start Time 0803    PT Stop Time 0844    PT Time Calculation (min) 41 min    Equipment Utilized During Treatment Gait belt    Activity Tolerance Patient limited by fatigue    Behavior During Therapy WFL for tasks assessed/performed                Past Medical History:  Diagnosis Date   Abnormal uterine bleeding    Allergy    Anxiety    Arthritis    Bipolar disorder (manic depression) (HCC)    Chronic kidney failure    Chronic renal disease, stage III (HCC)    Diabetes mellitus without complication (HCC)    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HCC) 2015   Right axillary lymph node resected and chemo tx's.   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 02/2023 Echo: EF 55-60%, no rwma, nl RV size/fxn. Mildly dil LA.   Personality disorder (HCC)    Pineal gland cyst    Post herpetic neuralgia    PTSD (post-traumatic stress disorder)    Renal disorder    S/P liver transplant (HCC) 1993   Sleep apnea    has not recieved cpap yet-other one was recalled   Past Surgical History:  Procedure Laterality Date   BONE MARROW BIOPSY  01/13/2015   BREAST BIOPSY Right 12/2014   [place] Axilla Bx, Positive for Lymphoma   BREAST BIOPSY Right 2011   benign   BREAST BIOPSY Right 2023    Stereo bx, X-clip, Fat Necrosis   BREAST SURGERY Right 2016   Lymphoma, lymph node removal. pt then got a seroma and had to do more surgery   CHOLECYSTECTOMY     COLONOSCOPY     COLONOSCOPY WITH PROPOFOL N/A 02/28/2022   Procedure: COLONOSCOPY WITH PROPOFOL;  Surgeon: Midge Minium, MD;  Location: [facility] ENDOSCOPY;  Service: Endoscopy;  Laterality: N/A;   ESOPHAGOGASTRODUODENOSCOPY     ESOPHAGOGASTRODUODENOSCOPY (EGD) WITH PROPOFOL N/A 01/09/2021   Procedure: ESOPHAGOGASTRODUODENOSCOPY (EGD) WITH PROPOFOL;  Surgeon: Midge Minium, MD;  Location: [facility] ENDOSCOPY;  Service: Endoscopy;  Laterality: N/A;   HERNIA REPAIR     x4-all from liver transplant   HYSTEROSCOPY WITH D & C N/A 11/13/2021   Procedure: DILATATION AND CURETTAGE /HYSTEROSCOPY;  Surgeon: Natale Milch, MD;  Location: [facility] ORS;  Service: Gynecology;  Laterality: N/A;   infusaport     LIVER TRANSPLANT  12/17/1991   LUMBAR PUNCTURE     PORT A CATH INJECTION ([facility] HX)     RENAL BIOPSY     tumor removal  2015   cancer right side   Patient Active Problem List   Diagnosis Date Noted   Weakness 10/16/2023   Flu 10/16/2023   SIRS (systemic inflammatory response syndrome) (HCC) 10/15/2023   Elevated serum creatinine 10/15/2023   Myalgia 10/15/2023   Elevated LFTs 10/15/2023   Acute idiopathic pericarditis 03/09/2023   Acute pericarditis 03/08/2023   Sepsis (HCC) 03/06/2023   Chest pain 03/06/2023   CKD (chronic kidney disease), stage II 01/22/2023   Insulin dependent type 2 diabetes mellitus (HCC) 01/22/2023   Overactive bladder 01/22/2023   High risk medication use 12/12/2022   Sjogren's syndrome (HCC) 05/27/2022   Menorrhagia with irregular cycle    Akathisia 10/24/2021   At risk for prolonged QT interval syndrome 09/12/2021   Prolonged Q-T interval on ECG 08/29/2021   Insomnia 07/20/2021   Bipolar disorder, in full remission, most recent episode mixed (HCC) 07/20/2021   GAD (generalized anxiety disorder)  05/09/2021   Opioid dependence with opioid-induced disorder (HCC) 12/20/2020   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   Carpal tunnel syndrome, left 07/26/2019   Cubital tunnel syndrome on left 07/26/2019   Umbilical hernia without obstruction and without gangrene 06/17/2019   Lumbar spondylosis 06/02/2019   Obesity (BMI 35.0-39.9 without comorbidity) 06/02/2019   Goals of care, counseling/discussion 05/18/2019   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   Fibromyalgia 03/23/2019   Systemic lupus erythematosus (SLE) in adult [facility]) 03/23/2019   Essential hypertension 03/23/2019   Mass of left kidney 03/23/2019   Nausea without vomiting 03/23/2019   Hyperlipidemia 03/23/2019   Osteoarthritis 03/23/2019   Trigeminal neuralgia 03/23/2019   Nephrolithiasis 03/23/2019   Migraine 03/23/2019   OSA on CPAP 03/23/2019   Fibrocystic breast 03/23/2019   Heart murmur 03/23/2019   Major depressive disorder, recurrent (HCC) 03/18/2017   Morbid obesity (HCC) 03/18/2017   Neuropathic pain 11/19/2016   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HCC) 01/13/2015   Lumbar disc disease with radiculopathy 07/26/2013   S/P liver transplant (HCC) 07/26/2013   Facial nerve disorder 06/29/2012   Low back pain 12/26/2010    PCP: [name] FNP  REFERRING PROVIDER: Janice Coffin PA  REFERRING DIAG: hip pain, bilateral  THERAPY DIAG:  Muscle weakness (generalized)  Difficulty in walking, not elsewhere classified  Pain of both hip joints  Rationale for Evaluation and Treatment: Rehabilitation  ONSET DATE: 2024  SUBJECTIVE:   SUBJECTIVE STATEMENT:      Patient reports her low back and R shoulder hurt this Am due to sleeping wrong. Not completing HEP.    PERTINENT HISTORY: Patient presents for evaluation of bilateral hip pain. She additionally  has PMH of ulnar neuropathy, migraines, trigeminal neuralgia, bilateral resting tremors, fibromyalgia, lupus, lymphoma, gout,  depression, bipolar disorder, DM, GERD, anxiety, chronic renal disease stage III, liver transplant (27 years ago for lupoid hepatitis), and chemotherapy.  Patient reports she can walk for a little bit then her hips hurt and then she has to sit for a long period.   Reports it has been getting worse. When she steps wrong it gets worse.  PAIN:  Are you having pain? Yes: NPRS scale: worst: 8/10, least pain: 0/10 Pain location: deep inside hip region Pain description: pinching pain Aggravating factors: walking, stepping wrong Relieving factors: rest  PRECAUTIONS: Fall  RED FLAGS: None   WEIGHT BEARING RESTRICTIONS: No  FALLS:  Has patient fallen in last 6 months? Yes. Number of falls 2  LIVING ENVIRONMENT: Lives with: lives with their spouse Lives in: House/apartment  Stairs:  elevator Has following equipment at home: Single point cane, Babetta Paterson - 4 wheeled, Wheelchair (power), Wheelchair (manual), and shower chair  OCCUPATION: disability;   Patient enjoys crafting, beadwork, jewelry, applicate, sewing.   PLOF: Independent with household mobility with device  PATIENT GOALS: to figure out what's triggering the pain, decrease pain  NEXT MD VISIT: patient not sure  OBJECTIVE:  Note: Objective measures were completed at Evaluation unless otherwise noted.  DIAGNOSTIC FINDINGS:   MRI T-Spine with and without contrast 05/18/2018: Moderate multilevel spondylosis and associated disc changes most significantly at T11-12, no abnormal cord signal, as detailed above. This study was compared to the patient's previous one dated 11/19/2017, stable findings.  MRI C-Spine 03/05/2018: Mild spondylosis and associated disc changes of the cervical and imaged thoracic spine, most significantly at T2-3, as detailed above. No intrinsic cord lesion to suggest multiple sclerosis. Left thyroid gland peripherally enhancing nodule recommend further evaluation should this be a new finding, such as with  ultrasound.This study was compared to the patient's previous one dated 03/30/2010, mild worsening at C5-6, previous study was limited to sagittal plane only.  MRI Brain 03/05/2018: Incidental note of partial opacification of the right sphenoid sinus. Vein adjacent and lateral to the left trigeminal nerve, no indentation or impingement, perpendicular to the nerve itself, doubtful clinical significance. This study was compared to the patient's previous one dated 08/31/2014, stable findings.   PATIENT SURVEYS:  LEFS 42  COGNITION: Overall cognitive status:  history of short term memory problems       SENSATION: WFL;   POSTURE: posterior pelvic tilt and flexed trunk   PALPATION: No tenderness to palpation   LOWER EXTREMITY ROM: WFL:  Slight lack of motion in hip extension bilaterally LOWER EXTREMITY MMT:  MMT Right eval Left eval  Hip flexion 4- 4-  Hip extension    Hip abduction 3 3  Hip adduction 3- 3-  Hip internal rotation    Hip external rotation    Knee flexion 3 3  Knee extension 3+ 3+  Ankle dorsiflexion 4- 4-  Ankle plantarflexion 4- 4-  Ankle inversion    Ankle eversion     (Blank rows = not tested)  LOWER EXTREMITY SPECIAL TESTS:  Hip special tests: Luisa Hart (FABER) test: positive , SI compression test: negative, SI distraction test: negative, Hip scouring test: positive , and Anterior hip impingement test: positive   FUNCTIONAL TESTS:  5 times sit to stand: 25.5 seconds with hands on plinth table  10 meter walk test: 8 seconds   GAIT: Distance walked: 40 ft Assistive device utilized: None Level of assistance: CGA Comments: decreased step length                                                                                                                                TREATMENT DATE: 01/14/24    Nustep level 2 x 6 minutes spm goal of 45 (patient able to average 45  this date)  Supine:  -Bridge with green TB around knees 2x15  -Marching with green TB  around knees 2 x 15 each LE   Sidelying: -clamshells green TB around knees 2 x15reps each side-   Sit to stands from EOM 3 x 10 with 3# DB in each hand at chest  Side stepping down/back 2x 8 steps each with RTB around knees - requires cuing for proper form/technique  Dynamic high marching 10' x 4 laps - difficulty with single leg balance on R LE   Seated hamstring curls with RTB x 15    PATIENT EDUCATION:  Education details: goals, POC HEP  Person educated: Patient Education method: Explanation, Demonstration, Tactile cues, and Verbal cues Education comprehension: verbalized understanding, returned demonstration, verbal cues required, tactile cues required, and needs further education  HOME EXERCISE PROGRAM: Access Code: [email or web address] URL: [email or web address] Date: 12/18/23 --> updated 01/05/2024 by Maylon Peppers  Prepared by: Precious Bard   Exercises - Leg Extension  - 1 x daily - 3-4 x weekly - 2 sets - 10 reps - 5 hold - Seated Hip Adduction Isometrics with Ball  - 1 x daily - 3-4 x weekly - 2 sets - 10 reps - 5 hold - Seated Hip Abduction with Resistance  - 1 x daily - 3-4 x weekly - 2 sets - 10 reps - 5 hold - Standing Hip Abduction with Counter Support  - 1 x daily - 3-4 x weekly - 3 sets - 10 reps - Standing March with Counter Support  - 1 x daily - 3-4 x weekly - 3 sets - 10 reps - Heel Raises with Counter Support  - 1 x daily - 3-4 x weekly - 3 sets - 10 reps - Sit to Stand with Arms Crossed  - 1 x daily - 3-4 x weekly - 3 sets - 10 reps   ASSESSMENT:  CLINICAL IMPRESSION:   Patient arrives to treatment session with complaints of low back and R shoulder pain due to sleeping wrong but motivated to participate. Session focused on BLE strengthening and single leg stance time. Difficulty with single leg stance on R LE during dynamic high marching. Patient will benefit from skilled physical therapy to reduce pain, improve functional mobility, and improve quality  of life.     OBJECTIVE IMPAIRMENTS: Abnormal gait, decreased activity tolerance, decreased endurance, decreased mobility, difficulty walking, decreased strength, hypomobility, impaired perceived functional ability, impaired flexibility, improper body mechanics, postural dysfunction, obesity, and pain.   ACTIVITY LIMITATIONS: bending, standing, squatting, stairs, transfers, locomotion level, and caring for others  PARTICIPATION LIMITATIONS: meal prep, cleaning, laundry, shopping, and community activity  PERSONAL FACTORS: Age, Fitness, Past/current experiences, Time since onset of injury/illness/exacerbation, and 3+ comorbidities: ulnar neuropathy, migraines, trigeminal neuralgia, bilateral resting tremors, fibromyalgia, lupus, lymphoma, gout, depression, bipolar disorder, DM, GERD, anxiety, chronic renal disease stage III, liver transplant (27 years ago for lupoid hepatitis), and chemotherapy.   are also affecting patient's functional outcome.   REHAB POTENTIAL: Good  CLINICAL DECISION MAKING: Evolving/moderate complexity  EVALUATION COMPLEXITY: Moderate   GOALS: Goals reviewed with patient? Yes  SHORT TERM GOALS: Target date: 01/15/2024   Patient will be independent in home exercise program to improve strength/mobility for better functional independence with ADLs. Baseline: 3/13: HEP given Goal status: INITIAL    LONG TERM GOALS: Target date: 02/12/2024    Patient (< 38 years old) will complete five times sit to stand test in < 10 seconds indicating an increased LE strength and improved balance.  Baseline: 25.5 seconds with UE support Goal status: INITIAL  2.  Patient will be able to ambulate 2000 steps and/or >6 minutes prior to pain increase allowing for improved community ambulation  Baseline: 3/13: 1000 steps prior to pain Goal status: INITIAL  3.  Patient will report a worst pain of 3/10 on VAS in bilateral hips to improve tolerance with ADLs and reduced symptoms with  activities.  Baseline: 3/13: 8/10  Goal status: INITIAL  4.  Patient will increase lower extremity functional scale to >60/80 to demonstrate improved functional mobility and increased tolerance with ADLs.  Baseline: 3/13: 42 Goal status: INITIAL    PLAN:  PT FREQUENCY: 2x/week  PT DURATION: 8 weeks  PLANNED INTERVENTIONS: 97164- PT Re-evaluation, 97110-Therapeutic exercises, 97530- Therapeutic activity, 97112- Neuromuscular re-education, 97535- Self Care, 40981- Manual therapy, [phone]- Gait training, [phone]- Orthotic Fit/training, [phone]- Splinting, [phone]- Electrical stimulation (unattended), [phone]- Electrical stimulation (manual), 97016- Vasopneumatic device, [id number]- Ultrasound, 52841- Traction (mechanical), Patient/Family education, Balance training, Stair training, Taping, Dry Needling, Joint mobilization, Spinal mobilization, Compression bandaging, Vestibular training, Visual/preceptual remediation/compensation, DME instructions, Wheelchair mobility training, Cryotherapy, and Moist heat  PLAN FOR NEXT SESSION:  - hip strengthening - upgrade HEP with resistance bands as appropriate  - progress step-ups with promotion of single-limb-stance for balance challenge   Maylon Peppers, PT, DPT  Physical Therapist - [place]  [facility]  8:50 AM 01/14/24

## 2024-01-14 NOTE — Therapy (Addendum)
 OUTPATIENT OCCUPATIONAL THERAPY NEURO TREATMENT NOTE  Patient Name: Alison [name] MRN: [id number] DOB:08-08-1976, 48 y.o., female Today's Date: 01/14/2024  PCP: [name], FNP REFERRING PROVIDER: Janice Coffin, PA-C  END OF SESSION:  OT End of Session - 01/14/24 0856     Visit Number 5    Number of Visits 12    Date for OT Re-Evaluation 01/29/24    OT Start Time 0845    OT Stop Time 0930    OT Time Calculation (min) 45 min    Activity Tolerance Patient tolerated treatment well    Behavior During Therapy WFL for tasks assessed/performed            Past Medical History:  Diagnosis Date   Abnormal uterine bleeding    Allergy    Anxiety    Arthritis    Bipolar disorder (manic depression) (HCC)    Chronic kidney failure    Chronic renal disease, stage III (HCC)    Diabetes mellitus without complication (HCC)    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HCC) 2015   Right axillary lymph node resected and chemo tx's.   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 02/2023 Echo: EF 55-60%, no rwma, nl RV size/fxn. Mildly dil LA.   Personality disorder (HCC)    Pineal gland cyst    Post herpetic neuralgia    PTSD (post-traumatic stress disorder)    Renal disorder    S/P liver transplant (HCC) 1993   Sleep apnea    has not recieved cpap yet-other one was recalled   Past Surgical History:  Procedure Laterality Date   BONE MARROW BIOPSY  01/13/2015   BREAST BIOPSY Right 12/2014   [place] Axilla Bx, Positive for Lymphoma   BREAST BIOPSY Right 2011   benign   BREAST  BIOPSY Right 2023   Stereo bx, X-clip, Fat Necrosis   BREAST SURGERY Right 2016   Lymphoma, lymph node removal. pt then got a seroma and had to do more surgery   CHOLECYSTECTOMY     COLONOSCOPY     COLONOSCOPY WITH PROPOFOL N/A 02/28/2022   Procedure: COLONOSCOPY WITH PROPOFOL;  Surgeon: Midge Minium, MD;  Location: [facility] ENDOSCOPY;  Service: Endoscopy;  Laterality: N/A;   ESOPHAGOGASTRODUODENOSCOPY     ESOPHAGOGASTRODUODENOSCOPY (EGD) WITH PROPOFOL N/A 01/09/2021   Procedure: ESOPHAGOGASTRODUODENOSCOPY (EGD) WITH PROPOFOL;  Surgeon: Midge Minium, MD;  Location: [facility] ENDOSCOPY;  Service: Endoscopy;  Laterality: N/A;   HERNIA REPAIR     x4-all from liver transplant   HYSTEROSCOPY WITH D & C N/A 11/13/2021   Procedure: DILATATION AND CURETTAGE /HYSTEROSCOPY;  Surgeon: Natale Milch, MD;  Location: [facility] ORS;  Service: Gynecology;  Laterality: N/A;   infusaport     LIVER TRANSPLANT  12/17/1991   LUMBAR PUNCTURE     PORT A CATH INJECTION ([facility] HX)     RENAL BIOPSY     tumor removal  2015  cancer right side   Patient Active Problem List   Diagnosis Date Noted   Weakness 10/16/2023   Flu 10/16/2023   SIRS (systemic inflammatory response syndrome) (HCC) 10/15/2023   Elevated serum creatinine 10/15/2023   Myalgia 10/15/2023   Elevated LFTs 10/15/2023   Acute idiopathic pericarditis 03/09/2023   Acute pericarditis 03/08/2023   Sepsis (HCC) 03/06/2023   Chest pain 03/06/2023   CKD (chronic kidney disease), stage II 01/22/2023   Insulin dependent type 2 diabetes mellitus (HCC) 01/22/2023   Overactive bladder 01/22/2023   High risk medication use 12/12/2022   Sjogren's syndrome (HCC) 05/27/2022   Menorrhagia with irregular cycle    Akathisia 10/24/2021   At risk for prolonged QT interval syndrome 09/12/2021   Prolonged Q-T interval on ECG 08/29/2021   Insomnia 07/20/2021   Bipolar disorder, in full remission, most recent episode mixed (HCC) 07/20/2021   GAD (generalized  anxiety disorder) 05/09/2021   Opioid dependence with opioid-induced disorder (HCC) 12/20/2020   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   Carpal tunnel syndrome, left 07/26/2019   Cubital tunnel syndrome on left 07/26/2019   Umbilical hernia without obstruction and without gangrene 06/17/2019   Lumbar spondylosis 06/02/2019   Obesity (BMI 35.0-39.9 without comorbidity) 06/02/2019   Goals of care, counseling/discussion 05/18/2019   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   Fibromyalgia 03/23/2019   Systemic lupus erythematosus (SLE) in adult [facility]) 03/23/2019   Essential hypertension 03/23/2019   Mass of left kidney 03/23/2019   Nausea without vomiting 03/23/2019   Hyperlipidemia 03/23/2019   Osteoarthritis 03/23/2019   Trigeminal neuralgia 03/23/2019   Nephrolithiasis 03/23/2019   Migraine 03/23/2019   OSA on CPAP 03/23/2019   Fibrocystic breast 03/23/2019   Heart murmur 03/23/2019   Major depressive disorder, recurrent (HCC) 03/18/2017   Morbid obesity (HCC) 03/18/2017   Neuropathic pain 11/19/2016   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HCC) 01/13/2015   Lumbar disc disease with radiculopathy 07/26/2013   S/P liver transplant (HCC) 07/26/2013   Facial nerve disorder 06/29/2012   Low back pain 12/26/2010   ONSET DATE: 10/08/2023  REFERRING DIAG: Z61.09 (ICD-10-CM) - Ulnar neuropathy of both upper extremities   THERAPY DIAG:  Muscle weakness (generalized)  Rationale for Evaluation and Treatment: Rehabilitation  SUBJECTIVE:  SUBJECTIVE STATEMENT:  Pt. reports that her pain is better today in her hands Pt accompanied by: self  PERTINENT HISTORY: Per MEDICAL RECORD NUMBERAssessment & Plan Ulnar Neuropathy Recurrent ulnar neuropathy with numbness, tingling in the last two fingers, muscle atrophy, and hand weakness for one month. Previous ulnar nerve entrapment resolved with exercises. Discussed occupational therapy and orthopedic evaluation for nerve release  or injections.  - Refer to occupational therapy for hand exercises and ulnar nerve management - Refer to [facility]  - Follow up in 4-6 months to monitor progress and reassess if symptoms worsen. Return precautions   PRECAUTIONS: Fall  WEIGHT BEARING RESTRICTIONS: No  PAIN: Are you having pain? 1/10 in bilateral hands. 5/10 right shoulder-Pt. reports from sleeping on it wrong.  FALLS: Has patient fallen in last 6 months? Yes. Number of falls 2 "serious falls"   LIVING ENVIRONMENT: Lives with: lives with their family, spouse and spouse's brother Lives in: 3rd floor apartment with elevator, 2 bedrooms Stairs: No Has following equipment at home: cane, rollator, power chair for long distance   PLOF: Independent  PATIENT GOALS: "I would like to try to un-entrap this nerve and gain back strength in my L hand."  OBJECTIVE:  Note: Objective measures  were completed at Evaluation unless otherwise noted.  HAND DOMINANCE: Right  ADLs: Overall ADLs: BUE weakness and numbness contribute to increased difficulty with [facility] components of ADL/IADLs Transfers/ambulation related to ADLs: modified indep-indep Eating: increased difficulty with cutting vegetables  Grooming: indep UB Dressing: indep LB Dressing: indep Toileting: indep Bathing: modified indep Tub Shower transfers: modified indep with shower chair Equipment: Shower seat with back  IADLs: Shopping: modified indep  Light housekeeping: indep Meal Prep: difficulty opening jars/containers, difficulty with prolonged standing; brother-in-law typically does the meal prep Community mobility: power chair for long distances (ie the zoo), occasional use of cane, occasional amb without AD; keeps a cane in each vehicle Medication management: modified indep (bubble packs from pharmacy) Financial management: shared task between spouse and pt Handwriting: 100% legible  MOBILITY STATUS:  see above  POSTURE COMMENTS:  Sitting balance:   WNL  ACTIVITY TOLERANCE: Activity tolerance: WFL for tasks assessed  FUNCTIONAL OUTCOME MEASURES: Eval:  MAM-20 for Musculoskeletal Conditions: 61/80   UPPER EXTREMITY ROM:  BUEs WNL  UPPER EXTREMITY MMT:     MMT Right eval Left eval  Shoulder flexion 4+ 4+  Shoulder abduction 4+ 4+  Shoulder adduction    Shoulder extension    Shoulder internal rotation    Shoulder external rotation    Middle trapezius    Lower trapezius    Elbow flexion 4+ 4+  Elbow extension 4+ 4+  Wrist flexion 5 4  Wrist extension 5 4  Wrist ulnar deviation 5 4+  Wrist radial deviation 4 4+  Wrist pronation 4+ 4  Wrist supination 4+ 4  (Blank rows = not tested)  HAND FUNCTION: Grip strength: Right: 62 lbs; Left: 37 lbs, Lateral pinch: Right: 14 lbs, Left: 9 lbs, and 3 point pinch: Right: 13 lbs, Left: 9 lbs  COORDINATION: 9 Hole Peg test: Right: 26 sec; Left: 28 sec  SENSATION: Light touch: Impaired , numbness/tingling in ulnar nerve distribution of BUEs L>R  EDEMA: No visible edema  MUSCLE TONE: RUE: Within functional limits and LUE: Within functional limits  COGNITION: Overall cognitive status: Within functional limits for tasks assessed  VISION: wears bifocals at baseline  PERCEPTION: Not tested  PRAXIS: WFL  OBSERVATIONS:   Pt. pleasant, cooperative, and eager to work towards goals in OT poc.                                                                                             TREATMENT DATE: 01/14/24  Manual Therapy:  -STM to the bilateral volar aspect of the hands, palms, thenar, and hypothenar eminences. Bilateral 1st dorsal interossei, and the left MP 2/2 pain, and stiffness. -Manual therapy was performed independent of, and in preparation for therapeutic Ex.    Paraffin Bath:  -Paraffin bath to the bilateral hands with a towel wrap for 10 min. 2/2 pain, and stiffness. Paraffin Bath was performed in preparation for manual therapy, and ROM.   Neuromuscular  re-education:   -Facilitated [facility] skills using the [employer] Tweezer Dexterity Task using resistive tweezers to pick up 1" thin sticks from a horizontal position, and sustaining the grasp while preparing to place  them into a vertical position with the pegboard placed at a flat tabletop surface.  PATIENT EDUCATION: Education details: Tendon glides, joint protection for farm type tasks. Person educated: Patient Education method: Explanation and Verbal cues, visual handout, demo Education comprehension: verbalized understanding, further training needed  HOME EXERCISE PROGRAM   Bilateral tendon glide exercises  GOALS: Goals reviewed with patient? Yes  SHORT TERM GOALS: Target date: 01/08/24  Pt will be indep to perform HEP for improving L hand strength for daily tasks. Baseline: Eval: Initiated theraputty exercises at eval Goal status: INITIAL  2.  Pt will be indep to verbalize 2-3 joint protection/cumulative trauma prevention strategies to reduce paresthesias in BUEs.  Baseline: Eval: Initiated educ on UE positioning during sleep/rest; further training needed Goal status: INITIAL  LONG TERM GOALS: Target date: 01/29/24  Pt will increase MAM-20 score by 9 or more points to indicate improvement in self perceived performance in [facility] components of daily tasks. Baseline: Eval: 61/80 Goal status: INITIAL  2.  Pt will increase L grip strength by 10 or more lbs in order to improve ability to open new jars/containers. Baseline: Eval: L grip strength 37 lbs (R 62 lbs) Goal status: INITIAL  3.  Pt will increase L lateral pinch strength by 3 or more lbs in order to improve efficiency with clipping R hand fingernails. Baseline: Eval: L 9 lbs (R 14 lbs) Goal status: INITIAL  4.  Pt will increase LUE distal strength by 1/2 muscle grade in order to improve ability to lift and carry heavy ADL supplies. Baseline: Eval: L wrist flex/ext 4/5 (R 5/5), L elbow flex/ext 4/5 (R 5/5) Goal status:  INITIAL  ASSESSMENT:  CLINICAL IMPRESSION:  Pt.'s bilateral hand pain is improving to 1/10. Pt. reports 5/10 right shoulder pain from positioning at the computer, and sleeping on it wrong. Pt. tolerated paraffin bath, and manual therapy/STM to the bilateral hands well today.  Pt. Education was provided about avoiding prolonged forceful gripping with a dog leash, resting the bilateral hands this week, as well as modifications for sustained gripping/joint protection. Pt continues to work towards goals to increase LUE strength and reduce paresthesias in BUEs in order to improve performance with above noted tasks. Pt will benefit from skilled OT to address above noted deficits and work towards goals in OT poc.   PERFORMANCE DEFICITS: in functional skills including ADLs, IADLs, coordination, dexterity, sensation, strength, Fine motor control, body mechanics, decreased knowledge of precautions, decreased knowledge of use of DME, and UE functional use, cognitive skills including memory, and psychosocial skills including coping strategies, environmental adaptation, habits, and routines and behaviors.   IMPAIRMENTS: are limiting patient from ADLs, IADLs, rest and sleep, and leisure.   CO-MORBIDITIES: has co-morbidities such as s/p liver transplant, anxiety, depression, bipolar, lymphoma  that affects occupational performance. Patient will benefit from skilled OT to address above impairments and improve overall function.  MODIFICATION OR ASSISTANCE TO COMPLETE EVALUATION: No modification of tasks or assist necessary to complete an evaluation.  OT OCCUPATIONAL PROFILE AND HISTORY: Detailed assessment: Review of records and additional review of physical, cognitive, psychosocial history related to current functional performance.  CLINICAL DECISION MAKING: Moderate - several treatment options, min-mod task modification necessary  REHAB POTENTIAL: Good  EVALUATION COMPLEXITY: Moderate    PLAN:  OT  FREQUENCY: 2x/week  OT DURATION: 6 weeks  PLANNED INTERVENTIONS: 97168 OT Re-evaluation, 97535 self care/ADL training, 16109 therapeutic exercise, 97530 therapeutic activity, 97112 neuromuscular re-education, 97140 manual therapy, 97010 moist heat, 97010 cryotherapy, 97034 contrast  bath, [id number] Orthotics management and training, 60454 Splinting (initial encounter), passive range of motion, psychosocial skills training, coping strategies training, patient/family education, and DME and/or AE instructions  RECOMMENDED OTHER SERVICES: Pt evaluated by PT this date to address bilat hip pain  CONSULTED AND AGREED WITH PLAN OF CARE: Patient  PLAN FOR NEXT SESSION: see above  Olegario Messier, MS, OTR/L   01/14/2024, 1:16 PM

## 2024-01-19 ENCOUNTER — Ambulatory Visit: Admitting: Occupational Therapy

## 2024-01-19 ENCOUNTER — Ambulatory Visit: Admitting: Physical Therapy

## 2024-01-20 ENCOUNTER — Ambulatory Visit
Admission: RE | Admit: 2024-01-20 | Discharge: 2024-01-20 | Disposition: A | Discharge status: Home / Self Care | Source: Ambulatory Visit | Attending: Nurse Practitioner | Admitting: Nurse Practitioner

## 2024-01-20 ENCOUNTER — Other Ambulatory Visit: Payer: Self-pay | Admitting: Diagnostic Radiology

## 2024-01-20 DIAGNOSIS — R928 Other abnormal and inconclusive findings on diagnostic imaging of breast: Secondary | ICD-10-CM | POA: Diagnosis not present

## 2024-01-20 DIAGNOSIS — N6311 Unspecified lump in the right breast, upper outer quadrant: Secondary | ICD-10-CM | POA: Diagnosis not present

## 2024-01-20 HISTORY — PX: BREAST BIOPSY: SHX20

## 2024-01-20 MED ORDER — LIDOCAINE 1 % OPTIME INJ - NO CHARGE
2.0000 mL | Freq: Once | INTRAMUSCULAR | Status: AC
  Administered 2024-01-20: 2 mL
  Filled 2024-01-20: qty 2

## 2024-01-20 MED ORDER — LIDOCAINE-EPINEPHRINE 1 %-1:100000 IJ SOLN
8.0000 mL | Freq: Once | INTRAMUSCULAR | Status: AC
  Administered 2024-01-20: 8 mL
  Filled 2024-01-20: qty 8

## 2024-01-21 ENCOUNTER — Ambulatory Visit

## 2024-01-21 ENCOUNTER — Ambulatory Visit: Admitting: Physical Therapy

## 2024-01-21 DIAGNOSIS — M79669 Pain in unspecified lower leg: Secondary | ICD-10-CM | POA: Diagnosis not present

## 2024-01-21 DIAGNOSIS — G894 Chronic pain syndrome: Secondary | ICD-10-CM | POA: Diagnosis not present

## 2024-01-21 DIAGNOSIS — G893 Neoplasm related pain (acute) (chronic): Secondary | ICD-10-CM | POA: Diagnosis not present

## 2024-01-21 DIAGNOSIS — M5442 Lumbago with sciatica, left side: Secondary | ICD-10-CM | POA: Diagnosis not present

## 2024-01-21 DIAGNOSIS — M5414 Radiculopathy, thoracic region: Secondary | ICD-10-CM | POA: Diagnosis not present

## 2024-01-21 DIAGNOSIS — I10 Essential (primary) hypertension: Secondary | ICD-10-CM | POA: Diagnosis not present

## 2024-01-21 DIAGNOSIS — M25519 Pain in unspecified shoulder: Secondary | ICD-10-CM | POA: Diagnosis not present

## 2024-01-21 DIAGNOSIS — M79606 Pain in leg, unspecified: Secondary | ICD-10-CM | POA: Diagnosis not present

## 2024-01-21 DIAGNOSIS — M79603 Pain in arm, unspecified: Secondary | ICD-10-CM | POA: Diagnosis not present

## 2024-01-21 DIAGNOSIS — F3189 Other bipolar disorder: Secondary | ICD-10-CM | POA: Diagnosis not present

## 2024-01-21 DIAGNOSIS — Z79891 Long term (current) use of opiate analgesic: Secondary | ICD-10-CM | POA: Diagnosis not present

## 2024-01-21 LAB — SURGICAL PATHOLOGY

## 2024-01-22 ENCOUNTER — Ambulatory Visit: Payer: Self-pay | Admitting: Obstetrics and Gynecology

## 2024-01-22 DIAGNOSIS — Z79891 Long term (current) use of opiate analgesic: Secondary | ICD-10-CM | POA: Diagnosis not present

## 2024-01-22 DIAGNOSIS — F4310 Post-traumatic stress disorder, unspecified: Secondary | ICD-10-CM | POA: Diagnosis not present

## 2024-01-22 DIAGNOSIS — F411 Generalized anxiety disorder: Secondary | ICD-10-CM | POA: Diagnosis not present

## 2024-01-26 ENCOUNTER — Ambulatory Visit: Admitting: Occupational Therapy

## 2024-01-26 ENCOUNTER — Ambulatory Visit: Admitting: Physical Therapy

## 2024-01-26 DIAGNOSIS — R262 Difficulty in walking, not elsewhere classified: Secondary | ICD-10-CM | POA: Diagnosis not present

## 2024-01-26 DIAGNOSIS — M25551 Pain in right hip: Secondary | ICD-10-CM | POA: Diagnosis not present

## 2024-01-26 DIAGNOSIS — M25552 Pain in left hip: Secondary | ICD-10-CM | POA: Diagnosis not present

## 2024-01-26 DIAGNOSIS — R278 Other lack of coordination: Secondary | ICD-10-CM | POA: Diagnosis not present

## 2024-01-26 DIAGNOSIS — M6281 Muscle weakness (generalized): Secondary | ICD-10-CM | POA: Diagnosis not present

## 2024-01-26 NOTE — Therapy (Signed)
 OUTPATIENT PHYSICAL THERAPY LOWER EXTREMITY TREATMENT   Patient Name: Alison [name] MRN: [id number] DOB:01-Dec-1975, 48 y.o., female Today's Date: 01/26/2024  END OF SESSION:   PT End of Session - 01/26/24 1448     Visit Number 6    Number of Visits 16    Date for PT Re-Evaluation 02/12/24    PT Start Time 1448    PT Stop Time 1530    PT Time Calculation (min) 42 min    Equipment Utilized During Treatment Gait belt    Activity Tolerance Patient limited by fatigue    Behavior During Therapy WFL for tasks assessed/performed                 Past Medical History:  Diagnosis Date   Abnormal uterine bleeding    Allergy    Anxiety    Arthritis    Bipolar disorder (manic depression) (HCC)    Chronic kidney failure    Chronic renal disease, stage III (HCC)    Diabetes mellitus without complication (HCC)    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HCC) 2015   Right axillary lymph node resected and chemo tx's.   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 02/2023 Echo: EF 55-60%, no rwma, nl RV size/fxn. Mildly dil LA.   Personality disorder (HCC)    Pineal gland cyst    Post herpetic neuralgia    PTSD (post-traumatic stress disorder)    Renal disorder    S/P liver transplant (HCC) 1993   Sleep apnea    has not recieved cpap yet-other one was recalled   Past Surgical History:  Procedure Laterality Date   BONE MARROW BIOPSY  01/13/2015   BREAST BIOPSY Right 12/2014   US  Axilla Bx, Positive for Lymphoma   BREAST BIOPSY Right 2011   benign   BREAST BIOPSY Right  2023   Stereo bx, X-clip, Fat Necrosis   BREAST BIOPSY Right 01/20/2024   US  RT BREAST BX W LOC DEV 1ST LESION IMG BX SPEC US  GUIDE 01/20/2024 [facility]   BREAST SURGERY Right 2016   Lymphoma, lymph node removal. pt then got a seroma and had to do more surgery   CHOLECYSTECTOMY     COLONOSCOPY     COLONOSCOPY WITH PROPOFOL  N/A 02/28/2022   Procedure: COLONOSCOPY WITH PROPOFOL ;  Surgeon: Marnee Sink, MD;  Location: [facility];  Service: Endoscopy;  Laterality: N/A;   ESOPHAGOGASTRODUODENOSCOPY     ESOPHAGOGASTRODUODENOSCOPY (EGD) WITH PROPOFOL  N/A 01/09/2021   Procedure: ESOPHAGOGASTRODUODENOSCOPY (EGD) WITH PROPOFOL ;  Surgeon: Marnee Sink, MD;  Location: [facility];  Service: Endoscopy;  Laterality: N/A;   HERNIA REPAIR     x4-all from liver transplant   HYSTEROSCOPY WITH D & C N/A 11/13/2021   Procedure: DILATATION AND CURETTAGE /HYSTEROSCOPY;  Surgeon: Heron Lord, MD;  Location: [facility];  Service: Gynecology;  Laterality: N/A;   infusaport     LIVER TRANSPLANT  12/17/1991  LUMBAR PUNCTURE     PORT A CATH INJECTION ([facility])     RENAL BIOPSY     tumor removal  2015   cancer right side   Patient Active Problem List   Diagnosis Date Noted   Weakness 10/16/2023   Flu 10/16/2023   SIRS (systemic inflammatory response syndrome) (HCC) 10/15/2023   Elevated serum creatinine 10/15/2023   Myalgia 10/15/2023   Elevated LFTs 10/15/2023   Acute idiopathic pericarditis 03/09/2023   Acute pericarditis 03/08/2023   Sepsis (HCC) 03/06/2023   Chest pain 03/06/2023   CKD (chronic kidney disease), stage II 01/22/2023   Insulin  dependent type 2 diabetes mellitus (HCC) 01/22/2023   Overactive bladder 01/22/2023   High risk medication use 12/12/2022   Sjogren's syndrome (HCC) 05/27/2022   Menorrhagia with irregular cycle    Akathisia 10/24/2021   At risk for prolonged QT interval syndrome 09/12/2021   Prolonged Q-T interval on ECG 08/29/2021   Insomnia 07/20/2021    Bipolar disorder, in full remission, most recent episode mixed (HCC) 07/20/2021   GAD (generalized anxiety disorder) 05/09/2021   Opioid dependence with opioid-induced disorder (HCC) 12/20/2020   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   Carpal tunnel syndrome, left 07/26/2019   Cubital tunnel syndrome on left 07/26/2019   Umbilical hernia without obstruction and without gangrene 06/17/2019   Lumbar spondylosis 06/02/2019   Obesity (BMI 35.0-39.9 without comorbidity) 06/02/2019   Goals of care, counseling/discussion 05/18/2019   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   Fibromyalgia 03/23/2019   Systemic lupus erythematosus (SLE) in adult [facility]) 03/23/2019   Essential hypertension 03/23/2019   Mass of left kidney 03/23/2019   Nausea without vomiting 03/23/2019   Hyperlipidemia 03/23/2019   Osteoarthritis 03/23/2019   Trigeminal neuralgia 03/23/2019   Nephrolithiasis 03/23/2019   Migraine 03/23/2019   OSA on CPAP 03/23/2019   Fibrocystic breast 03/23/2019   Heart murmur 03/23/2019   Major depressive disorder, recurrent (HCC) 03/18/2017   Morbid obesity (HCC) 03/18/2017   Neuropathic pain 11/19/2016   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HCC) 01/13/2015   Lumbar disc disease with radiculopathy 07/26/2013   S/P liver transplant (HCC) 07/26/2013   Facial nerve disorder 06/29/2012   Low back pain 12/26/2010    PCP: Quinton Buckler FNP  REFERRING PROVIDER: Paich, Kaitlin PA  REFERRING DIAG: hip pain, bilateral  THERAPY DIAG:  Muscle weakness (generalized)  Difficulty in walking, not elsewhere classified  Pain of both hip joints  Other lack of coordination  Rationale for Evaluation and Treatment: Rehabilitation  ONSET DATE: 2024  SUBJECTIVE:   SUBJECTIVE STATEMENT:      Pt reports she "stepped wrong" on her L hip this past Thursday, 4/17, and spent most of the weekend in the bed due to pain from this. Pt reports pain is currently rated as 3/10  located along L anterior hip/groin region. Pt reports she got a new dog, named Iris, and is enjoying her, but had a difficult time walking her this past weekend due to the pain. Pt requests not to over do it during therapy session today.  Pt reports she hasn't performed her HEP.    PERTINENT HISTORY: Patient presents for evaluation of bilateral hip pain. She additionally  has PMH of ulnar neuropathy, migraines, trigeminal neuralgia, bilateral resting tremors, fibromyalgia, lupus, lymphoma, gout, depression, bipolar disorder, DM, GERD, anxiety, chronic renal disease stage III, liver transplant (27 years ago for lupoid hepatitis), and chemotherapy.  Patient reports she can walk for a little bit then her hips  hurt and then she has to sit for a long period.   Reports it has been getting worse. When she steps wrong it gets worse.  PAIN:  Are you having pain? Yes: NPRS scale: worst: 8/10, least pain: 0/10 Pain location: deep inside hip region Pain description: pinching pain Aggravating factors: walking, stepping wrong Relieving factors: rest  PRECAUTIONS: Fall  RED FLAGS: None   WEIGHT BEARING RESTRICTIONS: No  FALLS:  Has patient fallen in last 6 months? Yes. Number of falls 2  LIVING ENVIRONMENT: Lives with: lives with their spouse Lives in: House/apartment Stairs:  elevator Has following equipment at home: Single point cane, Environmental consultant - 4 wheeled, Wheelchair (power), Wheelchair (manual), and shower chair  OCCUPATION: disability;   Patient enjoys crafting, beadwork, jewelry, applicate, sewing.   PLOF: Independent with household mobility with device  PATIENT GOALS: to figure out what's triggering the pain, decrease pain  NEXT MD VISIT: patient not sure  OBJECTIVE:  Note: Objective measures were completed at Evaluation unless otherwise noted.  DIAGNOSTIC FINDINGS:   MRI T-Spine with and without contrast 05/18/2018: Moderate multilevel spondylosis and associated disc changes most  significantly at T11-12, no abnormal cord signal, as detailed above. This study was compared to the patient's previous one dated 11/19/2017, stable findings.  MRI C-Spine 03/05/2018: Mild spondylosis and associated disc changes of the cervical and imaged thoracic spine, most significantly at T2-3, as detailed above. No intrinsic cord lesion to suggest multiple sclerosis. Left thyroid  gland peripherally enhancing nodule recommend further evaluation should this be a new finding, such as with ultrasound.This study was compared to the patient's previous one dated 03/30/2010, mild worsening at C5-6, previous study was limited to sagittal plane only.  MRI Brain 03/05/2018: Incidental note of partial opacification of the right sphenoid sinus. Vein adjacent and lateral to the left trigeminal nerve, no indentation or impingement, perpendicular to the nerve itself, doubtful clinical significance. This study was compared to the patient's previous one dated 08/31/2014, stable findings.   PATIENT SURVEYS:  LEFS 42  COGNITION: Overall cognitive status:  history of short term memory problems       SENSATION: WFL;   POSTURE: posterior pelvic tilt and flexed trunk   PALPATION: No tenderness to palpation   LOWER EXTREMITY ROM: WFL:  Slight lack of motion in hip extension bilaterally LOWER EXTREMITY MMT:  MMT Right eval Left eval  Hip flexion 4- 4-  Hip extension    Hip abduction 3 3  Hip adduction 3- 3-  Hip internal rotation    Hip external rotation    Knee flexion 3 3  Knee extension 3+ 3+  Ankle dorsiflexion 4- 4-  Ankle plantarflexion 4- 4-  Ankle inversion    Ankle eversion     (Blank rows = not tested)  LOWER EXTREMITY SPECIAL TESTS:  Hip special tests: Portia Brittle (FABER) test: positive , SI compression test: negative, SI distraction test: negative, Hip scouring test: positive , and Anterior hip impingement test: positive   FUNCTIONAL TESTS:  5 times sit to stand: 25.5 seconds with hands  on plinth table  10 meter walk test: 8 seconds   GAIT: Distance walked: 40 ft Assistive device utilized: None Level of assistance: CGA Comments: decreased step length  TREATMENT DATE: 01/26/24   Nustep level 2 for 3 minutes increased to level 3 for 3 minutes totaling 6 minutes  - spm goal of 45, able to average 45 this date with cuing for attention to task - totaling 226 steps   B LE hip strengthening including:  - supine bridges with green TB around knees for increased hip abductor activation 3x15 - continues to only have min hip clearance with this - sidelying clamshells green TB around knees 2 x15reps each side - muscles fatigue achieving slightly smaller ROM with each rep   Sit to stands from EOM x 10 reps, no resistance initially progressed to adding 3# DB in each hand at chest 2 x10reps  Updated pt's HEP below and narrowed it down to 3 primary exercises to perform at home to improve compliance - updated printout provided.  Side stepping down/back 2x 8-10 steps each and 1x ~5 steps each with GTB around knees - requires cuing for proper form/technique especially when stepping towards L as pt starts to rotate her hips  Pt reporting B LE fatigue.  Stair navigation training ascending/descending 4 steps (6" height) using B HRs with close SBA for safety - self-selects reciprocal pattern on ascent with good ability to power up although at slow speed and step-to pattern on descent with pt having L quad muscle fatigue with muscle shaking when leading with R LE so transitioned to leading with L LE. - patient denies any symptoms indicating nerve involvement in L LE and reports muscle fatigue   PATIENT EDUCATION:  Education details: goals, POC HEP  Person educated: Patient Education method: Explanation, Demonstration, Tactile cues, and Verbal cues Education  comprehension: verbalized understanding, returned demonstration, verbal cues required, tactile cues required, and needs further education  HOME EXERCISE PROGRAM: Access Code: [email or web address] URL: [email or web address] Date: 01/26/2024 Prepared by: Carlen Chasten  Exercises - Sit to Stand with Arms Crossed  - 1 x daily - 3-4 x weekly - 3 sets - 10 reps - Side Stepping with Resistance at Thighs and Counter Support  - 1 x daily - 7 x weekly - 3 sets - 10 reps - Supine Bridge with Resistance Band  - 1 x daily - 7 x weekly - 3 sets - 10 reps   ASSESSMENT:  CLINICAL IMPRESSION:   Patient arrives to treatment session with complaints of L hip pain due to "stepping wrong" last Thursday. Patient participated in B LE functional strengthening exercises targeting hip musculature to help decrease her pain. Patient reports prior to this past Thursday she felt she was having an overall improvement in her symptoms. Patient tolerated all interventions well today with no increase in her pain; however, did notice muscle fatigue towards end of session with L quad muscle shaking when descending the stairs requiring eccentric control. Ms. Rudder will benefit from continued skilled physical therapy to reduce pain, improve functional mobility, and improve quality of life.     OBJECTIVE IMPAIRMENTS: Abnormal gait, decreased activity tolerance, decreased endurance, decreased mobility, difficulty walking, decreased strength, hypomobility, impaired perceived functional ability, impaired flexibility, improper body mechanics, postural dysfunction, obesity, and pain.   ACTIVITY LIMITATIONS: bending, standing, squatting, stairs, transfers, locomotion level, and caring for others  PARTICIPATION LIMITATIONS: meal prep, cleaning, laundry, shopping, and community activity  PERSONAL FACTORS: Age, Fitness, Past/current experiences, Time since onset of injury/illness/exacerbation, and 3+ comorbidities: ulnar neuropathy,  migraines, trigeminal neuralgia, bilateral resting tremors, fibromyalgia, lupus, lymphoma, gout, depression, bipolar disorder, DM, GERD, anxiety, chronic renal disease stage III, liver transplant (27  years ago for lupoid hepatitis), and chemotherapy.   are also affecting patient's functional outcome.   REHAB POTENTIAL: Good  CLINICAL DECISION MAKING: Evolving/moderate complexity  EVALUATION COMPLEXITY: Moderate   GOALS: Goals reviewed with patient? Yes  SHORT TERM GOALS: Target date: 01/15/2024   Patient will be independent in home exercise program to improve strength/mobility for better functional independence with ADLs. Baseline: 3/13: HEP given Goal status: INITIAL    LONG TERM GOALS: Target date: 02/12/2024    Patient (< 80 years old) will complete five times sit to stand test in < 10 seconds indicating an increased LE strength and improved balance. Baseline: 25.5 seconds with UE support Goal status: INITIAL  2.  Patient will be able to ambulate 2000 steps and/or >6 minutes prior to pain increase allowing for improved community ambulation  Baseline: 3/13: 1000 steps prior to pain Goal status: INITIAL  3.  Patient will report a worst pain of 3/10 on VAS in bilateral hips to improve tolerance with ADLs and reduced symptoms with activities.  Baseline: 3/13: 8/10  Goal status: INITIAL  4.  Patient will increase lower extremity functional scale to >60/80 to demonstrate improved functional mobility and increased tolerance with ADLs.  Baseline: 3/13: 42 Goal status: INITIAL    PLAN:  PT FREQUENCY: 2x/week  PT DURATION: 8 weeks  PLANNED INTERVENTIONS: 97164- PT Re-evaluation, 97110-Therapeutic exercises, 97530- Therapeutic activity, 97112- Neuromuscular re-education, 97535- Self Care, 16109- Manual therapy, [phone]- Gait training, [phone]- Orthotic Fit/training, [phone]- Splinting, [phone]- Electrical stimulation (unattended), [phone]- Electrical stimulation (manual), 97016-  Vasopneumatic device, [id number]- Ultrasound, 57846- Traction (mechanical), Patient/Family education, Balance training, Stair training, Taping, Dry Needling, Joint mobilization, Spinal mobilization, Compression bandaging, Vestibular training, Visual/preceptual remediation/compensation, DME instructions, Wheelchair mobility training, Cryotherapy, and Moist heat  PLAN FOR NEXT SESSION:  - follow-up on updated HEP - hip strengthening - progress step-ups with promotion of single-limb-stance for balance challenge - perform stair navigation targeting improved L quad eccentric control on descent    Elba Schaber, PT, DPT, NCS, CSRS Physical Therapist - [facility]  [facility]  3:32 PM 01/26/24

## 2024-01-26 NOTE — Therapy (Signed)
 OUTPATIENT OCCUPATIONAL THERAPY NEURO TREATMENT NOTE  Patient Name: Alison [name] MRN: [id number] DOB:December 23, 1975, 48 y.o., female Today's Date: 01/26/2024  PCP: Quinton Buckler, FNP REFERRING PROVIDER: Kaitlin Paich, PA-C  END OF SESSION:  OT End of Session - 01/26/24 1558     Visit Number 6    Number of Visits 12    Date for OT Re-Evaluation 01/29/24    OT Start Time 1400    OT Stop Time 1445    OT Time Calculation (min) 45 min    Activity Tolerance Patient tolerated treatment well    Behavior During Therapy WFL for tasks assessed/performed            Past Medical History:  Diagnosis Date   Abnormal uterine bleeding    Allergy    Anxiety    Arthritis    Bipolar disorder (manic depression) (HCC)    Chronic kidney failure    Chronic renal disease, stage III (HCC)    Diabetes mellitus without complication (HCC)    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HCC) 2015   Right axillary lymph node resected and chemo tx's.   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 02/2023 Echo: EF 55-60%, no rwma, nl RV size/fxn. Mildly dil LA.   Personality disorder (HCC)    Pineal gland cyst    Post herpetic neuralgia    PTSD (post-traumatic stress disorder)    Renal disorder    S/P liver transplant (HCC) 1993   Sleep apnea    has not recieved cpap yet-other one was recalled   Past Surgical History:  Procedure Laterality Date   BONE MARROW BIOPSY  01/13/2015   BREAST BIOPSY Right 12/2014   US  Axilla Bx, Positive for Lymphoma   BREAST BIOPSY Right 2011   benign   BREAST  BIOPSY Right 2023   Stereo bx, X-clip, Fat Necrosis   BREAST BIOPSY Right 01/20/2024   US  RT BREAST BX W LOC DEV 1ST LESION IMG BX SPEC US  GUIDE 01/20/2024 [facility]   BREAST SURGERY Right 2016   Lymphoma, lymph node removal. pt then got a seroma and had to do more surgery   CHOLECYSTECTOMY     COLONOSCOPY     COLONOSCOPY WITH PROPOFOL  N/A 02/28/2022   Procedure: COLONOSCOPY WITH PROPOFOL ;  Surgeon: Marnee Sink, MD;  Location: [facility] ENDOSCOPY;  Service: Endoscopy;  Laterality: N/A;   ESOPHAGOGASTRODUODENOSCOPY     ESOPHAGOGASTRODUODENOSCOPY (EGD) WITH PROPOFOL  N/A 01/09/2021   Procedure: ESOPHAGOGASTRODUODENOSCOPY (EGD) WITH PROPOFOL ;  Surgeon: Marnee Sink, MD;  Location: [facility] ENDOSCOPY;  Service: Endoscopy;  Laterality: N/A;   HERNIA REPAIR     x4-all from liver transplant   HYSTEROSCOPY WITH D & C N/A 11/13/2021   Procedure: DILATATION AND CURETTAGE /HYSTEROSCOPY;  Surgeon: Heron Lord, MD;  Location: [facility] ORS;  Service: Gynecology;  Laterality: N/A;   infusaport     LIVER TRANSPLANT  12/17/1991   LUMBAR PUNCTURE  PORT A CATH INJECTION ([facility] HX)     RENAL BIOPSY     tumor removal  2015   cancer right side   Patient Active Problem List   Diagnosis Date Noted   Weakness 10/16/2023   Flu 10/16/2023   SIRS (systemic inflammatory response syndrome) (HCC) 10/15/2023   Elevated serum creatinine 10/15/2023   Myalgia 10/15/2023   Elevated LFTs 10/15/2023   Acute idiopathic pericarditis 03/09/2023   Acute pericarditis 03/08/2023   Sepsis (HCC) 03/06/2023   Chest pain 03/06/2023   CKD (chronic kidney disease), stage II 01/22/2023   Insulin  dependent type 2 diabetes mellitus (HCC) 01/22/2023   Overactive bladder 01/22/2023   High risk medication use 12/12/2022   Sjogren's syndrome (HCC) 05/27/2022   Menorrhagia with irregular cycle    Akathisia 10/24/2021   At risk for prolonged QT interval syndrome 09/12/2021   Prolonged Q-T interval on ECG 08/29/2021    Insomnia 07/20/2021   Bipolar disorder, in full remission, most recent episode mixed (HCC) 07/20/2021   GAD (generalized anxiety disorder) 05/09/2021   Opioid dependence with opioid-induced disorder (HCC) 12/20/2020   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   Carpal tunnel syndrome, left 07/26/2019   Cubital tunnel syndrome on left 07/26/2019   Umbilical hernia without obstruction and without gangrene 06/17/2019   Lumbar spondylosis 06/02/2019   Obesity (BMI 35.0-39.9 without comorbidity) 06/02/2019   Goals of care, counseling/discussion 05/18/2019   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   Fibromyalgia 03/23/2019   Systemic lupus erythematosus (SLE) in adult [facility]) 03/23/2019   Essential hypertension 03/23/2019   Mass of left kidney 03/23/2019   Nausea without vomiting 03/23/2019   Hyperlipidemia 03/23/2019   Osteoarthritis 03/23/2019   Trigeminal neuralgia 03/23/2019   Nephrolithiasis 03/23/2019   Migraine 03/23/2019   OSA on CPAP 03/23/2019   Fibrocystic breast 03/23/2019   Heart murmur 03/23/2019   Major depressive disorder, recurrent (HCC) 03/18/2017   Morbid obesity (HCC) 03/18/2017   Neuropathic pain 11/19/2016   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HCC) 01/13/2015   Lumbar disc disease with radiculopathy 07/26/2013   S/P liver transplant (HCC) 07/26/2013   Facial nerve disorder 06/29/2012   Low back pain 12/26/2010   ONSET DATE: 10/08/2023  REFERRING DIAG: Z61.09 (ICD-10-CM) - Ulnar neuropathy of both upper extremities   THERAPY DIAG:  Muscle weakness (generalized)  Rationale for Evaluation and Treatment: Rehabilitation  SUBJECTIVE:  SUBJECTIVE STATEMENT:  Pt. Reports getting a new dog this weekend. Pt accompanied by: self  PERTINENT HISTORY: Per MEDICAL RECORD NUMBERAssessment & Plan Ulnar Neuropathy Recurrent ulnar neuropathy with numbness, tingling in the last two fingers, muscle atrophy, and hand weakness for one month. Previous ulnar nerve  entrapment resolved with exercises. Discussed occupational therapy and orthopedic evaluation for nerve release or injections.  - Refer to occupational therapy for hand exercises and ulnar nerve management - Refer to [facility]  - Follow up in 4-6 months to monitor progress and reassess if symptoms worsen. Return precautions   PRECAUTIONS: Fall  WEIGHT BEARING RESTRICTIONS: No  PAIN: Are you having pain? Hip Discomfort-NR  FALLS: Has patient fallen in last 6 months? Yes. Number of falls 2 "serious falls"   LIVING ENVIRONMENT: Lives with: lives with their family, spouse and spouse's brother Lives in: 3rd floor apartment with elevator, 2 bedrooms Stairs: No Has following equipment at home: cane, rollator, power chair for long distance   PLOF: Independent  PATIENT GOALS: "I would like to try to un-entrap this nerve and gain back strength in my  L hand."  OBJECTIVE:  Note: Objective measures were completed at Evaluation unless otherwise noted.  HAND DOMINANCE: Right  ADLs: Overall ADLs: BUE weakness and numbness contribute to increased difficulty with [facility] components of ADL/IADLs Transfers/ambulation related to ADLs: modified indep-indep Eating: increased difficulty with cutting vegetables  Grooming: indep UB Dressing: indep LB Dressing: indep Toileting: indep Bathing: modified indep Tub Shower transfers: modified indep with shower chair Equipment: Shower seat with back  IADLs: Shopping: modified indep  Light housekeeping: indep Meal Prep: difficulty opening jars/containers, difficulty with prolonged standing; brother-in-law typically does the meal prep Community mobility: power chair for long distances (ie the zoo), occasional use of cane, occasional amb without AD; keeps a cane in each vehicle Medication management: modified indep (bubble packs from pharmacy) Financial management: shared task between spouse and pt Handwriting: 100% legible  MOBILITY STATUS:  see  above  POSTURE COMMENTS:  Sitting balance:  WNL  ACTIVITY TOLERANCE: Activity tolerance: WFL for tasks assessed  FUNCTIONAL OUTCOME MEASURES: Eval:  MAM-20 for Musculoskeletal Conditions: 61/80   UPPER EXTREMITY ROM:  BUEs WNL  UPPER EXTREMITY MMT:     MMT Right eval Left eval  Shoulder flexion 4+ 4+  Shoulder abduction 4+ 4+  Shoulder adduction    Shoulder extension    Shoulder internal rotation    Shoulder external rotation    Middle trapezius    Lower trapezius    Elbow flexion 4+ 4+  Elbow extension 4+ 4+  Wrist flexion 5 4  Wrist extension 5 4  Wrist ulnar deviation 5 4+  Wrist radial deviation 4 4+  Wrist pronation 4+ 4  Wrist supination 4+ 4  (Blank rows = not tested)  HAND FUNCTION: Grip strength: Right: 62 lbs; Left: 37 lbs, Lateral pinch: Right: 14 lbs, Left: 9 lbs, and 3 point pinch: Right: 13 lbs, Left: 9 lbs  COORDINATION: 9 Hole Peg test: Right: 26 sec; Left: 28 sec  SENSATION: Light touch: Impaired , numbness/tingling in ulnar nerve distribution of BUEs L>R  EDEMA: No visible edema  MUSCLE TONE: RUE: Within functional limits and LUE: Within functional limits  COGNITION: Overall cognitive status: Within functional limits for tasks assessed  VISION: wears bifocals at baseline  PERCEPTION: Not tested  PRAXIS: WFL  OBSERVATIONS:   Pt. pleasant, cooperative, and eager to work towards goals in OT poc.                                                                                             TREATMENT DATE: 01/26/24  Manual Therapy:  -STM to the bilateral volar aspect of the hands, palms, thenar, and hypothenar eminences. Bilateral 1st dorsal interossei, and the left MP 2/2 pain, and stiffness. -Manual therapy was performed independent of, and in preparation for therapeutic Ex.    Paraffin Bath:  -Paraffin bath to the bilateral hands with a towel wrap for 10 min. 2/2 pain, and stiffness. Paraffin Bath was performed in preparation for  manual therapy, and ROM.   Therapeutic Ex.:   -Progressive bilateral gross grip strengthening with 17.9# of grip strength resistive force. -Bilateral Lateral, and 3pt. Pinch strengthening using yellow, red, green,  and blue resistive clips    PATIENT EDUCATION: Education details: Tendon glides, joint protection for farm type tasks, work simplification strategies for carrying 50# bags of chicken feed. Person educated: Patient Education method: Explanation and Verbal cues, visual handout, demo Education comprehension: verbalized understanding, further training needed  HOME EXERCISE PROGRAM   Bilateral tendon glide exercises  GOALS: Goals reviewed with patient? Yes  SHORT TERM GOALS: Target date: 01/08/24  Pt will be indep to perform HEP for improving L hand strength for daily tasks. Baseline: Eval: Initiated theraputty exercises at eval Goal status: INITIAL  2.  Pt will be indep to verbalize 2-3 joint protection/cumulative trauma prevention strategies to reduce paresthesias in BUEs.  Baseline: Eval: Initiated educ on UE positioning during sleep/rest; further training needed Goal status: INITIAL  LONG TERM GOALS: Target date: 01/29/24  Pt will increase MAM-20 score by 9 or more points to indicate improvement in self perceived performance in [facility] components of daily tasks. Baseline: Eval: 61/80 Goal status: INITIAL  2.  Pt will increase L grip strength by 10 or more lbs in order to improve ability to open new jars/containers. Baseline: Eval: L grip strength 37 lbs (R 62 lbs) Goal status: INITIAL  3.  Pt will increase L lateral pinch strength by 3 or more lbs in order to improve efficiency with clipping R hand fingernails. Baseline: Eval: L 9 lbs (R 14 lbs) Goal status: INITIAL  4.  Pt will increase LUE distal strength by 1/2 muscle grade in order to improve ability to lift and carry heavy ADL supplies. Baseline: Eval: L wrist flex/ext 4/5 (R 5/5), L elbow flex/ext 4/5 (R  5/5) Goal status: INITIAL  ASSESSMENT:  CLINICAL IMPRESSION:  Pt. reports having hip discomfort today. Pt. was able to tolerate the grip, pinch strengthening tasks, paraffin bath, and STM well during the session. Pt. Continues to engage her hand during more tasks at home. Pt. education was provided about work simplification strategies for carrying 50# bags of chicken feed. Pt continues to work towards goals to increase LUE strength and reduce paresthesias in BUEs in order to improve performance with above noted tasks. Pt will benefit from skilled OT to address above noted deficits and work towards goals in OT poc.   PERFORMANCE DEFICITS: in functional skills including ADLs, IADLs, coordination, dexterity, sensation, strength, Fine motor control, body mechanics, decreased knowledge of precautions, decreased knowledge of use of DME, and UE functional use, cognitive skills including memory, and psychosocial skills including coping strategies, environmental adaptation, habits, and routines and behaviors.   IMPAIRMENTS: are limiting patient from ADLs, IADLs, rest and sleep, and leisure.   CO-MORBIDITIES: has co-morbidities such as s/p liver transplant, anxiety, depression, bipolar, lymphoma  that affects occupational performance. Patient will benefit from skilled OT to address above impairments and improve overall function.  MODIFICATION OR ASSISTANCE TO COMPLETE EVALUATION: No modification of tasks or assist necessary to complete an evaluation.  OT OCCUPATIONAL PROFILE AND HISTORY: Detailed assessment: Review of records and additional review of physical, cognitive, psychosocial history related to current functional performance.  CLINICAL DECISION MAKING: Moderate - several treatment options, min-mod task modification necessary  REHAB POTENTIAL: Good  EVALUATION COMPLEXITY: Moderate    PLAN:  OT FREQUENCY: 2x/week  OT DURATION: 6 weeks  PLANNED INTERVENTIONS: 97168 OT Re-evaluation, 97535  self care/ADL training, 82956 therapeutic exercise, 97530 therapeutic activity, 97112 neuromuscular re-education, 97140 manual therapy, 97010 moist heat, 97010 cryotherapy, 97034 contrast bath, 97760 Orthotics management and training, 21308 Splinting (initial encounter), passive range of  motion, psychosocial skills training, coping strategies training, patient/family education, and DME and/or AE instructions  RECOMMENDED OTHER SERVICES: Pt evaluated by PT this date to address bilat hip pain  CONSULTED AND AGREED WITH PLAN OF CARE: Patient  PLAN FOR NEXT SESSION: see above  Duey Ghent, MS, OTR/L   01/26/2024, 4:00 PM

## 2024-01-28 ENCOUNTER — Ambulatory Visit: Admitting: Physical Therapy

## 2024-01-28 ENCOUNTER — Ambulatory Visit

## 2024-02-02 ENCOUNTER — Ambulatory Visit

## 2024-02-02 ENCOUNTER — Ambulatory Visit: Admitting: Physical Therapy

## 2024-02-04 ENCOUNTER — Ambulatory Visit

## 2024-02-06 DIAGNOSIS — F331 Major depressive disorder, recurrent, moderate: Secondary | ICD-10-CM | POA: Diagnosis not present

## 2024-02-06 DIAGNOSIS — F4310 Post-traumatic stress disorder, unspecified: Secondary | ICD-10-CM | POA: Diagnosis not present

## 2024-02-06 DIAGNOSIS — F411 Generalized anxiety disorder: Secondary | ICD-10-CM | POA: Diagnosis not present

## 2024-02-09 ENCOUNTER — Ambulatory Visit

## 2024-02-09 ENCOUNTER — Ambulatory Visit: Admitting: Physical Therapy

## 2024-02-11 ENCOUNTER — Ambulatory Visit: Admitting: "Endocrinology

## 2024-02-11 ENCOUNTER — Ambulatory Visit: Admitting: Physical Therapy

## 2024-02-11 ENCOUNTER — Ambulatory Visit

## 2024-02-12 ENCOUNTER — Telehealth: Payer: Self-pay

## 2024-02-12 NOTE — Telephone Encounter (Signed)
 Notified pt of plan to d/c OT/PT visits at this time d/t pt with inconsistent attendance preventing her from progressing with therapy goals. Encouraged pt to seek out a new referral if/when she is able to attend consistently. Pt verbalized understanding.   Marcus Sewer, MS, OTR/L

## 2024-02-12 NOTE — Progress Notes (Deleted)
  Start: *** end: *** Patient is here today *** Patient would like to learn *** Patient lives with ***.  *** shopping and cooking.  History includes:  *** Medications include:  *** Labs noted:  ***  12/29/2023: Patient is here today alone. Patient would like to learn more about vegetarian meal planning. Patient lives with husband and her borther in Social worker. Pt reports brother in law does the shopping and shared shopping. Pt reports her largest challenge is "I only eat when I'm hungry". Pt reports she is following a [employer] dietary pattern and states she is currently considering adding chicken. Pt denies CGM stating she never received this device. Pt reports missed insulin  doses of Basaglar  3-4 times weekly stating she forgets. Pt reports Novo log is missed every other day stating she uses a sliding scale if her blood sugars are over 250 mg/dL. 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 Pt reports she is testing her blood sugar "3 times weekly" and was encouraged to increase to before meals and bedtime. Pt reports hypoglycemia treatment with a teaspoon of honey and was encouraged to carry a fast acting sugar. All Pt's questions were answered during this encounter.

## 2024-02-16 ENCOUNTER — Ambulatory Visit

## 2024-02-16 ENCOUNTER — Ambulatory Visit: Admitting: Dietician

## 2024-02-16 ENCOUNTER — Encounter: Payer: Self-pay | Admitting: Nurse Practitioner

## 2024-02-16 ENCOUNTER — Ambulatory Visit: Admitting: Physical Therapy

## 2024-02-16 DIAGNOSIS — E1165 Type 2 diabetes mellitus with hyperglycemia: Secondary | ICD-10-CM

## 2024-02-18 ENCOUNTER — Ambulatory Visit: Admitting: Occupational Therapy

## 2024-02-18 ENCOUNTER — Ambulatory Visit: Admitting: Physical Therapy

## 2024-02-18 DIAGNOSIS — G894 Chronic pain syndrome: Secondary | ICD-10-CM | POA: Diagnosis not present

## 2024-02-18 DIAGNOSIS — M5414 Radiculopathy, thoracic region: Secondary | ICD-10-CM | POA: Diagnosis not present

## 2024-02-18 DIAGNOSIS — M5442 Lumbago with sciatica, left side: Secondary | ICD-10-CM | POA: Diagnosis not present

## 2024-02-18 DIAGNOSIS — M79669 Pain in unspecified lower leg: Secondary | ICD-10-CM | POA: Diagnosis not present

## 2024-02-18 DIAGNOSIS — Z79891 Long term (current) use of opiate analgesic: Secondary | ICD-10-CM | POA: Diagnosis not present

## 2024-02-18 DIAGNOSIS — M79603 Pain in arm, unspecified: Secondary | ICD-10-CM | POA: Diagnosis not present

## 2024-02-18 DIAGNOSIS — G893 Neoplasm related pain (acute) (chronic): Secondary | ICD-10-CM | POA: Diagnosis not present

## 2024-02-18 DIAGNOSIS — M79606 Pain in leg, unspecified: Secondary | ICD-10-CM | POA: Diagnosis not present

## 2024-02-18 DIAGNOSIS — I10 Essential (primary) hypertension: Secondary | ICD-10-CM | POA: Diagnosis not present

## 2024-02-18 DIAGNOSIS — M25519 Pain in unspecified shoulder: Secondary | ICD-10-CM | POA: Diagnosis not present

## 2024-02-18 DIAGNOSIS — F3189 Other bipolar disorder: Secondary | ICD-10-CM | POA: Diagnosis not present

## 2024-02-23 ENCOUNTER — Ambulatory Visit

## 2024-02-23 ENCOUNTER — Emergency Department

## 2024-02-23 ENCOUNTER — Ambulatory Visit: Admitting: Physical Therapy

## 2024-02-23 ENCOUNTER — Emergency Department
Admission: EM | Admit: 2024-02-23 | Discharge: 2024-02-23 | Disposition: A | Discharge status: Home / Self Care | Attending: Emergency Medicine | Admitting: Emergency Medicine

## 2024-02-23 ENCOUNTER — Other Ambulatory Visit: Payer: Self-pay

## 2024-02-23 DIAGNOSIS — R0602 Shortness of breath: Secondary | ICD-10-CM | POA: Insufficient documentation

## 2024-02-23 DIAGNOSIS — R160 Hepatomegaly, not elsewhere classified: Secondary | ICD-10-CM | POA: Diagnosis not present

## 2024-02-23 DIAGNOSIS — E1142 Type 2 diabetes mellitus with diabetic polyneuropathy: Secondary | ICD-10-CM | POA: Diagnosis not present

## 2024-02-23 DIAGNOSIS — R0789 Other chest pain: Secondary | ICD-10-CM | POA: Diagnosis not present

## 2024-02-23 DIAGNOSIS — R079 Chest pain, unspecified: Secondary | ICD-10-CM | POA: Diagnosis not present

## 2024-02-23 DIAGNOSIS — I129 Hypertensive chronic kidney disease with stage 1 through stage 4 chronic kidney disease, or unspecified chronic kidney disease: Secondary | ICD-10-CM | POA: Diagnosis not present

## 2024-02-23 DIAGNOSIS — R509 Fever, unspecified: Secondary | ICD-10-CM | POA: Insufficient documentation

## 2024-02-23 DIAGNOSIS — R590 Localized enlarged lymph nodes: Secondary | ICD-10-CM | POA: Diagnosis not present

## 2024-02-23 DIAGNOSIS — N183 Chronic kidney disease, stage 3 unspecified: Secondary | ICD-10-CM | POA: Insufficient documentation

## 2024-02-23 DIAGNOSIS — I517 Cardiomegaly: Secondary | ICD-10-CM | POA: Diagnosis not present

## 2024-02-23 LAB — BASIC METABOLIC PANEL WITH GFR
Anion gap: 10 (ref 5–15)
BUN: 20 mg/dL (ref 6–20)
CO2: 24 mmol/L (ref 22–32)
Calcium: 8.9 mg/dL (ref 8.9–10.3)
Chloride: 99 mmol/L (ref 98–111)
Creatinine, Ser: 1.25 mg/dL — ABNORMAL HIGH (ref 0.44–1.00)
GFR, Estimated: 53 mL/min — ABNORMAL LOW (ref >60)
Glucose, Bld: 241 mg/dL — ABNORMAL HIGH (ref 70–99)
Potassium: 4.7 mmol/L (ref 3.5–5.1)
Sodium: 133 mmol/L — ABNORMAL LOW (ref 135–145)

## 2024-02-23 LAB — CBC WITH DIFFERENTIAL/PLATELET
Abs Immature Granulocytes: 0.06 10*3/uL (ref 0.00–0.07)
Basophils Absolute: 0.1 10*3/uL (ref 0.0–0.1)
Basophils Relative: 1 %
Eosinophils Absolute: 0.2 10*3/uL (ref 0.0–0.5)
Eosinophils Relative: 2 %
HCT: 38.6 % (ref 36.0–46.0)
Hemoglobin: 12.6 g/dL (ref 12.0–15.0)
Immature Granulocytes: 1 %
Lymphocytes Relative: 36 %
Lymphs Abs: 3.7 10*3/uL (ref 0.7–4.0)
MCH: 29.5 pg (ref 26.0–34.0)
MCHC: 32.6 g/dL (ref 30.0–36.0)
MCV: 90.4 fL (ref 80.0–100.0)
Monocytes Absolute: 0.9 10*3/uL (ref 0.1–1.0)
Monocytes Relative: 8 %
Neutro Abs: 5.5 10*3/uL (ref 1.7–7.7)
Neutrophils Relative %: 52 %
Platelets: 191 10*3/uL (ref 150–400)
RBC: 4.27 MIL/uL (ref 3.87–5.11)
RDW: 13.4 % (ref 11.5–15.5)
WBC: 10.5 10*3/uL (ref 4.0–10.5)
nRBC: 0 % (ref 0.0–0.2)

## 2024-02-23 LAB — RESP PANEL BY RT-PCR (RSV, FLU A&B, COVID)  RVPGX2
Influenza A by PCR: NEGATIVE
Influenza B by PCR: NEGATIVE
Resp Syncytial Virus by PCR: NEGATIVE
SARS Coronavirus 2 by RT PCR: NEGATIVE

## 2024-02-23 LAB — TROPONIN I (HIGH SENSITIVITY)
Troponin I (High Sensitivity): 7 ng/L (ref <18)
Troponin I (High Sensitivity): 6 ng/L (ref <18)

## 2024-02-23 LAB — LACTIC ACID, PLASMA
Lactic Acid, Venous: 2.2 mmol/L (ref 0.5–1.9)
Lactic Acid, Venous: 1.4 mmol/L (ref 0.5–1.9)

## 2024-02-23 MED ORDER — IOHEXOL 350 MG/ML SOLN
75.0000 mL | Freq: Once | INTRAVENOUS | Status: AC | PRN
  Administered 2024-02-23: 75 mL via INTRAVENOUS

## 2024-02-23 MED ORDER — HYDROMORPHONE HCL 1 MG/ML IJ SOLN
1.0000 mg | Freq: Once | INTRAMUSCULAR | Status: AC
  Administered 2024-02-23: 1 mg via INTRAVENOUS
  Filled 2024-02-23: qty 1

## 2024-02-23 MED ORDER — PANTOPRAZOLE SODIUM 40 MG IV SOLR
40.0000 mg | Freq: Once | INTRAVENOUS | Status: AC
  Administered 2024-02-23: 40 mg via INTRAVENOUS
  Filled 2024-02-23: qty 10

## 2024-02-23 MED ORDER — HEPARIN SOD (PORK) LOCK FLUSH 100 UNIT/ML IV SOLN
500.0000 [IU] | Freq: Once | INTRAVENOUS | Status: AC
  Administered 2024-02-23: 500 [IU] via INTRAVENOUS
  Filled 2024-02-23: qty 5

## 2024-02-23 MED ORDER — OXYCODONE HCL 5 MG PO TABS: 5.0000 mg | ORAL_TABLET | Freq: Once | ORAL | Status: DC

## 2024-02-23 MED ORDER — OXYCODONE-ACETAMINOPHEN 5-325 MG PO TABS
1.0000 | ORAL_TABLET | Freq: Once | ORAL | Status: AC
  Administered 2024-02-23: 1 via ORAL
  Filled 2024-02-23: qty 1

## 2024-02-23 MED ORDER — ACETAMINOPHEN 325 MG PO TABS
650.0000 mg | ORAL_TABLET | Freq: Once | ORAL | Status: AC
  Administered 2024-02-23: 650 mg via ORAL
  Filled 2024-02-23: qty 2

## 2024-02-23 NOTE — ED Provider Notes (Signed)
-----------------------------------------   7:05 AM on 02/23/2024 -----------------------------------------  Blood pressure 106/75, pulse 68, temperature 99.1 F (37.3 C), temperature source Oral, resp. rate 16, height 5\' 6"  (1.676 m), weight 113.4 kg, last menstrual period 10/16/2021, SpO2 94%.  Assuming care from Dr. Vicenta Graft.  In short, Alison [name] is a 48 y.o. female with a chief complaint of Chest Pain .  Refer to the original H&P for additional details.  The current plan of care is to follow-up CTA chest for PE vs pneumonia.   ----------------------------------------- 10:01 AM on 02/23/2024 ----------------------------------------- CTA chest is negative for PE or other acute finding, viral panel is unremarkable.  Patient's fever has resolved following Tylenol  and no findings concerning for sepsis at this time.  Suspect viral illness and possible pericarditis, as patient reports history of this.  She is appropriate for outpatient management with cardiology follow-up, referral provided.  She was counseled to return to the ED for new or worsening symptoms, patient agrees with plan.    Twilla Galea, MD 02/23/24 1002

## 2024-02-23 NOTE — ED Notes (Signed)
 Pt ambulatory independently with steady gait from bed to toilet.

## 2024-02-23 NOTE — ED Triage Notes (Signed)
 Pt reports chest pain in the middle of her chest that she describes as a rubbing sensation in her chest when she breathes. Reports she also feels sob. Pt reports hx pericarditis.

## 2024-02-23 NOTE — ED Notes (Signed)
 Patient transported to CT

## 2024-02-23 NOTE — ED Provider Notes (Signed)
 [facility] Provider Note    Event Date/Time   First MD Initiated Contact with Patient 02/23/24 [phone]     (approximate)   History   Chief Complaint: Chest Pain   HPI  Alison [name] is a 48 y.o. female with a history of bipolar disorder, GERD, hypertension, liver transplant, breast cancer who comes to the ED complaining of central chest pain associate with shortness of breath, worse with deep breathing.  Nonradiating.  No diaphoresis or vomiting, not exertional.  Gradual onset earlier in the day and progressively worsening.  Constant.  Endorses fever and chills as well        Past Medical History:  Diagnosis Date   Abnormal uterine bleeding    Allergy    Anxiety    Arthritis    Bipolar disorder (manic depression) (HCC)    Chronic kidney failure    Chronic renal disease, stage III (HCC)    Diabetes mellitus without complication (HCC)    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HCC) 2015   Right axillary lymph node resected and chemo tx's.   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 02/2023 Echo: EF 55-60%, no rwma, nl RV size/fxn. Mildly dil LA.   Personality disorder (HCC)    Pineal gland cyst    Post herpetic neuralgia    PTSD (post-traumatic stress disorder)    Renal disorder    S/P liver transplant (HCC) 1993   Sleep apnea    has not recieved cpap yet-other one was recalled    Current Outpatient Rx   Order #: [id number] Class: Historical Med   Order #: [id number] Class: Normal   Order #: [id number] Class: Historical Med   Order #:  [id number] Class: Normal   Order #: [id number] Class: Normal   Order #: [id number] Class: Historical Med   Order #: [id number] Class: Historical Med   Order #: [id number] Class: Historical Med   Order #: [id number] Class: Historical Med   Order #: [id number] Class: Historical Med   Order #: [id number] Class: Normal   Order #: [id number] Class: Normal   Order #: [id number] Class: Historical Med   Order #: [id number] Class: Normal   Order #: [id number] Class: Normal   Order #: [id number] Class: Normal   Order #: [id number] Class: Historical Med   Order #: [id number] Class: Historical Med   Order #: [id number] Class: Normal   Order #: [id number] Class: Normal   Order #: [id number] Class: Normal   Order #: [id number] Class: Historical Med   Order #: [id number] Class: Historical Med   Order #: [id number] Class: Normal   Order #: [id number] Class: Historical Med   Order #: [id number] Class: Historical Med   Order #: [id number] Class: Print   Order #: [id number] Class: Normal   Order #: [id number] Class: Normal   Order #: [id number] Class: Historical Med   Order #: [id number] Class: Historical Med   Order #: [id number] Class: Historical Med   Order #: [id number] Class: Normal   Order #: [id number] Class: Historical Med    Past Surgical History:  Procedure Laterality Date  BONE MARROW BIOPSY  01/13/2015   BREAST BIOPSY Right 12/2014   US  Axilla Bx, Positive for Lymphoma   BREAST BIOPSY Right 2011   benign   BREAST BIOPSY Right 2023   Stereo bx, X-clip, Fat Necrosis   BREAST BIOPSY Right 01/20/2024   US  RT BREAST BX W LOC DEV 1ST LESION IMG BX SPEC US  GUIDE 01/20/2024 [facility]   BREAST SURGERY Right 2016   Lymphoma, lymph node removal. pt then got a seroma and had to do more surgery   CHOLECYSTECTOMY     COLONOSCOPY     COLONOSCOPY WITH PROPOFOL  N/A 02/28/2022   Procedure: COLONOSCOPY WITH PROPOFOL ;  Surgeon: Marnee Sink, MD;  Location: [facility] ENDOSCOPY;  Service: Endoscopy;  Laterality: N/A;   ESOPHAGOGASTRODUODENOSCOPY      ESOPHAGOGASTRODUODENOSCOPY (EGD) WITH PROPOFOL  N/A 01/09/2021   Procedure: ESOPHAGOGASTRODUODENOSCOPY (EGD) WITH PROPOFOL ;  Surgeon: Marnee Sink, MD;  Location: [facility] ENDOSCOPY;  Service: Endoscopy;  Laterality: N/A;   HERNIA REPAIR     x4-all from liver transplant   HYSTEROSCOPY WITH D & C N/A 11/13/2021   Procedure: DILATATION AND CURETTAGE /HYSTEROSCOPY;  Surgeon: Heron Lord, MD;  Location: [facility] ORS;  Service: Gynecology;  Laterality: N/A;   infusaport     LIVER TRANSPLANT  12/17/1991   LUMBAR PUNCTURE     PORT A CATH INJECTION ([facility] HX)     RENAL BIOPSY     tumor removal  2015   cancer right side    Physical Exam   Triage Vital Signs: ED Triage Vitals  Encounter Vitals Group     BP 02/23/24 0330 123/71     Systolic BP Percentile --      Diastolic BP Percentile --      Pulse Rate 02/23/24 0330 88     Resp 02/23/24 0330 18     Temp 02/23/24 0328 (!) 101.7 F (38.7 C)     Temp Source 02/23/24 0328 Oral     SpO2 02/23/24 0330 97 %     Weight 02/23/24 0327 250 lb (113.4 kg)     Height 02/23/24 0327 5\' 6"  (1.676 m)     Head Circumference --      Peak Flow --      Pain Score 02/23/24 0327 7     Pain Loc --      Pain Education --      Exclude from Growth Chart --     Most recent vital signs: Vitals:   02/23/24 0600 02/23/24 0630  BP: 113/71 106/75  Pulse: 73 68  Resp: (!) 22 16  Temp: 99.1 F (37.3 C)   SpO2: 91% 94%    General: Awake, no distress.  CV:  Good peripheral perfusion.  Regular rate and rhythm, no rubs or crunch Resp:  Normal effort.  Clear to auscultation bilaterally, no wheezing Abd:  No distention.  Soft nontender Other:  No lower extremity edema.  Symmetric calf circumference   ED Results / Procedures / Treatments   Labs (all labs ordered are listed, but only abnormal results are displayed) Labs Reviewed  BASIC METABOLIC PANEL WITH GFR - Abnormal; Notable for the following components:      Result Value   Sodium 133 (*)     Glucose, Bld 241 (*)    Creatinine, Ser 1.25 (*)    GFR, Estimated 53 (*)    All other components within normal limits  LACTIC ACID, PLASMA - Abnormal; Notable for the following components:   Lactic Acid, Venous 2.2 (*)  All other components within normal limits  RESP PANEL BY RT-PCR (RSV, FLU A&B, COVID)  RVPGX2  CBC WITH DIFFERENTIAL/PLATELET  LACTIC ACID, PLASMA  TROPONIN I (HIGH SENSITIVITY)  TROPONIN I (HIGH SENSITIVITY)     EKG Interpreted by me Normal sinus rhythm rate of 89.  Left axis, right bundle branch block.  No acute ischemic changes.   RADIOLOGY Chest x-ray interpreted by me, unremarkable.  Radiology report reviewed   PROCEDURES:  Procedures   MEDICATIONS ORDERED IN ED: Medications  acetaminophen  (TYLENOL ) tablet 650 mg (650 mg Oral Given 02/23/24 0332)  HYDROmorphone  (DILAUDID ) injection 1 mg (1 mg Intravenous Given 02/23/24 0533)  pantoprazole  (PROTONIX ) injection 40 mg (40 mg Intravenous Given 02/23/24 0711)     IMPRESSION / MDM / ASSESSMENT AND PLAN / ED COURSE  I reviewed the triage vital signs and the nursing notes.  DDx: Pneumonia, pleural effusion, pneumothorax, pulmonary embolism, non-STEMI, GERD  Patient's presentation is most consistent with acute presentation with potential threat to life or bodily function.     Clinical Course as of 02/23/24 4098  [facility] Feb 23, 2024  1191 Patient presents with pleuritic chest pain starting several hours ago, associated shortness of breath.  She is nontoxic.  Does have a fever of 101.7 here.  Chest x-ray unremarkable. Will obtain CTA chest [PS]    Clinical Course User Index [PS] Jacquie Maudlin, MD     FINAL CLINICAL IMPRESSION(S) / ED DIAGNOSES   Final diagnoses:  Nonspecific chest pain     Rx / DC Orders   ED Discharge Orders     None        Note:  This document was prepared using Dragon voice recognition software and may include unintentional dictation errors.   Jacquie Maudlin,  MD 02/23/24 [phone]

## 2024-02-24 ENCOUNTER — Encounter: Payer: Self-pay | Admitting: Nurse Practitioner

## 2024-02-25 ENCOUNTER — Telehealth: Admitting: Nurse Practitioner

## 2024-02-25 ENCOUNTER — Ambulatory Visit

## 2024-02-25 ENCOUNTER — Encounter: Payer: Self-pay | Admitting: Nurse Practitioner

## 2024-02-25 ENCOUNTER — Ambulatory Visit: Admitting: Physical Therapy

## 2024-02-25 ENCOUNTER — Inpatient Hospital Stay: Admitting: Nurse Practitioner

## 2024-02-25 VITALS — Temp 101.2°F

## 2024-02-25 DIAGNOSIS — E119 Type 2 diabetes mellitus without complications: Secondary | ICD-10-CM

## 2024-02-25 DIAGNOSIS — Z794 Long term (current) use of insulin: Secondary | ICD-10-CM | POA: Diagnosis not present

## 2024-02-25 DIAGNOSIS — G500 Trigeminal neuralgia: Secondary | ICD-10-CM | POA: Diagnosis not present

## 2024-02-25 DIAGNOSIS — N182 Chronic kidney disease, stage 2 (mild): Secondary | ICD-10-CM | POA: Diagnosis not present

## 2024-02-25 DIAGNOSIS — F411 Generalized anxiety disorder: Secondary | ICD-10-CM

## 2024-02-25 DIAGNOSIS — F3178 Bipolar disorder, in full remission, most recent episode mixed: Secondary | ICD-10-CM

## 2024-02-25 DIAGNOSIS — E1169 Type 2 diabetes mellitus with other specified complication: Secondary | ICD-10-CM

## 2024-02-25 DIAGNOSIS — G2111 Neuroleptic induced parkinsonism: Secondary | ICD-10-CM

## 2024-02-25 DIAGNOSIS — G4733 Obstructive sleep apnea (adult) (pediatric): Secondary | ICD-10-CM

## 2024-02-25 DIAGNOSIS — T43505A Adverse effect of unspecified antipsychotics and neuroleptics, initial encounter: Secondary | ICD-10-CM

## 2024-02-25 DIAGNOSIS — M329 Systemic lupus erythematosus, unspecified: Secondary | ICD-10-CM

## 2024-02-25 DIAGNOSIS — M797 Fibromyalgia: Secondary | ICD-10-CM

## 2024-02-25 DIAGNOSIS — M3500 Sicca syndrome, unspecified: Secondary | ICD-10-CM

## 2024-02-25 DIAGNOSIS — N3281 Overactive bladder: Secondary | ICD-10-CM

## 2024-02-25 DIAGNOSIS — M792 Neuralgia and neuritis, unspecified: Secondary | ICD-10-CM

## 2024-02-25 DIAGNOSIS — I10 Essential (primary) hypertension: Secondary | ICD-10-CM | POA: Diagnosis not present

## 2024-02-25 DIAGNOSIS — M1990 Unspecified osteoarthritis, unspecified site: Secondary | ICD-10-CM

## 2024-02-25 DIAGNOSIS — G43809 Other migraine, not intractable, without status migrainosus: Secondary | ICD-10-CM

## 2024-02-25 DIAGNOSIS — R309 Painful micturition, unspecified: Secondary | ICD-10-CM

## 2024-02-25 LAB — POCT URINALYSIS DIPSTICK
Bilirubin, UA: NEGATIVE
Blood, UA: POSITIVE
Glucose, UA: NEGATIVE
Ketones, UA: NEGATIVE
Nitrite, UA: NEGATIVE
Odor: NORMAL
Protein, UA: POSITIVE — AB
Spec Grav, UA: 1.015 (ref 1.010–1.025)
Urobilinogen, UA: 0.2 U/dL
pH, UA: 6.5 (ref 5.0–8.0)

## 2024-02-25 MED ORDER — NITROFURANTOIN MONOHYD MACRO 100 MG PO CAPS: 100.0000 mg | ORAL_CAPSULE | Freq: Two times a day (BID) | ORAL | 0 refills | Status: DC

## 2024-02-25 NOTE — Progress Notes (Deleted)
 LMP 10/16/2021 (Exact Date) Comment: Seen by OB GYN on 10/16/21 for 6 days of excessive bleeding as a post menapause   Subjective:    Patient ID: Alison [name], female    DOB: 1976-07-29, 48 y.o.   MRN: [id number]  HPI: Alison [name] is a 48 y.o. female  No chief complaint on file.   Discussed the use of AI scribe software for clinical note transcription with the patient, who gave verbal consent to proceed.  History of Present Illness          01/05/2024    9:50 AM 12/29/2023    2:18 PM 11/04/2023    1:15 PM  Depression screen PHQ 2/9  Decreased Interest 0 0   Down, Depressed, Hopeless 0 1   PHQ - 2 Score 0 1   Altered sleeping 0    Tired, decreased energy 0    Change in appetite 0    Feeling bad or failure about yourself  0    Trouble concentrating 0    Moving slowly or fidgety/restless 0    Suicidal thoughts 0    PHQ-9 Score 0    Difficult doing work/chores Not difficult at all       Information is confidential and restricted. Go to Review Flowsheets to unlock data.    Relevant past medical, surgical, family and social history reviewed and updated as indicated. Interim medical history since our last visit reviewed. Allergies and medications reviewed and updated.  Review of Systems  Per HPI unless specifically indicated above     Objective:      LMP 10/16/2021 (Exact Date) Comment: Seen by OB GYN on 10/16/21 for 6 days of excessive bleeding as a post menapause  {Vitals History (Optional):23777} Wt Readings from Last 3 Encounters:  02/23/24 250 lb (113.4 kg)  01/05/24 252 lb 8 oz (114.5 kg)  12/05/23 252 lb 4.8 oz (114.4 kg)    Physical Exam Physical Exam    Results for orders placed or performed during the hospital encounter of 02/23/24  CBC with Differential   Collection Time: 02/23/24  3:31 AM  Result Value Ref Range   WBC 10.5 4.0 - 10.5 K/uL   RBC 4.27 3.87 - 5.11 MIL/uL   Hemoglobin 12.6 12.0 - 15.0 g/dL   HCT 40.9 81.1 - 91.4 %   MCV  90.4 80.0 - 100.0 fL   MCH 29.5 26.0 - 34.0 pg   MCHC 32.6 30.0 - 36.0 g/dL   RDW 78.2 95.6 - 21.3 %   Platelets 191 150 - 400 K/uL   nRBC 0.0 0.0 - 0.2 %   Neutrophils Relative % 52 %   Neutro Abs 5.5 1.7 - 7.7 K/uL   Lymphocytes Relative 36 %   Lymphs Abs 3.7 0.7 - 4.0 K/uL   Monocytes Relative 8 %   Monocytes Absolute 0.9 0.1 - 1.0 K/uL   Eosinophils Relative 2 %   Eosinophils Absolute 0.2 0.0 - 0.5 K/uL   Basophils Relative 1 %   Basophils Absolute 0.1 0.0 - 0.1 K/uL   Immature Granulocytes 1 %   Abs Immature Granulocytes 0.06 0.00 - 0.07 K/uL  Basic metabolic panel   Collection Time: 02/23/24  3:31 AM  Result Value Ref Range   Sodium 133 (L) 135 - 145 mmol/L   Potassium 4.7 3.5 - 5.1 mmol/L   Chloride 99 98 - 111 mmol/L   CO2 24 22 - 32 mmol/L   Glucose, Bld 241 (H) 70 - 99 mg/dL  BUN 20 6 - 20 mg/dL   Creatinine, Ser 1.61 (H) 0.44 - 1.00 mg/dL   Calcium  8.9 8.9 - 10.3 mg/dL   GFR, Estimated 53 (L) >60 mL/min   Anion gap 10 5 - 15  Lactic acid, plasma   Collection Time: 02/23/24  3:31 AM  Result Value Ref Range   Lactic Acid, Venous 2.2 (HH) 0.5 - 1.9 mmol/L  Troponin I (High Sensitivity)   Collection Time: 02/23/24  3:31 AM  Result Value Ref Range   Troponin I (High Sensitivity) 7 <18 ng/L  Lactic acid, plasma   Collection Time: 02/23/24  5:44 AM  Result Value Ref Range   Lactic Acid, Venous 1.4 0.5 - 1.9 mmol/L  Troponin I (High Sensitivity)   Collection Time: 02/23/24  5:44 AM  Result Value Ref Range   Troponin I (High Sensitivity) 6 <18 ng/L  Resp panel by RT-PCR (RSV, Flu A&B, Covid) Anterior Nasal Swab   Collection Time: 02/23/24  7:10 AM   Specimen: Anterior Nasal Swab  Result Value Ref Range   SARS Coronavirus 2 by RT PCR NEGATIVE NEGATIVE   Influenza A by PCR NEGATIVE NEGATIVE   Influenza B by PCR NEGATIVE NEGATIVE   Resp Syncytial Virus by PCR NEGATIVE NEGATIVE   {Labs (Optional):23779}       Assessment & Plan:   Problem List Items  Addressed This Visit   None    Assessment and Plan Assessment & Plan         Follow up plan: No follow-ups on file.

## 2024-02-25 NOTE — Progress Notes (Signed)
 Name: Alison [name]   MRN: [id number]    DOB: [birth year]   Date:02/25/2024       Progress Note  Subjective  Chief Complaint  Chief Complaint  Patient presents with   Urinary Tract Infection    Painful urination, urgency, fever, onset 48hrs    I connected with  Alison [name]  on 02/25/24 at  1:40 PM EDT by a video enabled telemedicine application and verified that I am speaking with the correct person using two identifiers.  I discussed the limitations of evaluation and management by telemedicine and the availability of in person appointments. The patient expressed understanding and agreed to proceed with a virtual visit  Staff also discussed with the patient that there may be a patient responsible charge related to this service. Patient Location: home Provider Location: [facility] Additional Individuals present: alone  HPI   Discussed the use of AI scribe software for clinical note transcription with the patient, who gave verbal consent to proceed.  History of Present Illness Alison [name] is a 48 year old female with hypertension and insulin  dependent type two diabetes who presents with fever, urinary symptoms, and chest pain.  She has been experiencing fever, urinary urgency, and dysuria for the past 48 hours, along with urinary incontinence if she does not reach the bathroom in time.  She recently visited the emergency room due to weakness, muscle aches, and chest tightness. She continues to experience chest pain, particularly when moving or inhaling deeply. During the ER visit, she was given pain medication and underwent cardiac tests, which were reported as normal. She is scheduled to see her cardiologist next week.  Her past medical history includes hypertension, migraines, sleep apnea managed with a CPAP machine, insulin  dependent type two diabetes, Parkinsonisms, trigeminal neuralgia, osteoarthritis, chronic kidney disease, bipolar disorder, fibromyalgia, anxiety, morbid  obesity, neuropathic pain, Sjogren's syndrome, lupus, and a history of liver transplant. She also has a history of lymphoma and is followed by oncology.  Current medications include albuterol  as needed, baclofen  10 mg twice daily, Cogentin  1 mg daily, carvedilol 3.125 mg daily, vitamin D  daily, Zetia  10 mg daily, glipizide  10 mg twice daily, Basaglar  insulin  40 units at bedtime, lisinopril  20 mg daily, Latuda  20 mg daily, metformin  1000 mg twice daily, Myrbetriq  50 mg daily, Remeron  15 mg daily, NovoLog  sliding scale three times a day, oxycodone  for chronic pain, Lyrica  200 mg twice daily, rosuvastatin  10 mg daily, Ozempic  0.25 mg weekly, Prograf  1 mg daily, Ubrelvy  as needed, and Effexor  37.5 mg daily.  Her last A1c was 11.8, and she is followed by endocrinology for her diabetes management.    Patient Active Problem List   Diagnosis Date Noted   Weakness 10/16/2023   Flu 10/16/2023   SIRS (systemic inflammatory response syndrome) (HCC) 10/15/2023   Elevated serum creatinine 10/15/2023   Myalgia 10/15/2023   Elevated LFTs 10/15/2023   Acute idiopathic pericarditis 03/09/2023   Acute pericarditis 03/08/2023   Sepsis (HCC) 03/06/2023   Chest pain 03/06/2023   CKD (chronic kidney disease), stage II 01/22/2023   Insulin  dependent type 2 diabetes mellitus (HCC) 01/22/2023   Overactive bladder 01/22/2023   High risk medication use 12/12/2022   Sjogren's syndrome (HCC) 05/27/2022   Menorrhagia with irregular cycle    Akathisia 10/24/2021   At risk for prolonged QT interval syndrome 09/12/2021   Prolonged Q-T interval on ECG 08/29/2021   Insomnia 07/20/2021   Bipolar disorder, in full remission, most recent episode mixed (HCC) 07/20/2021  GAD (generalized anxiety disorder) 05/09/2021   Opioid dependence with opioid-induced disorder (HCC) 12/20/2020   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   Carpal tunnel syndrome, left 07/26/2019   Cubital tunnel syndrome on left 07/26/2019    Umbilical hernia without obstruction and without gangrene 06/17/2019   Lumbar spondylosis 06/02/2019   Obesity (BMI 35.0-39.9 without comorbidity) 06/02/2019   Goals of care, counseling/discussion 05/18/2019   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   Fibromyalgia 03/23/2019   Systemic lupus erythematosus (SLE) in adult [facility]) 03/23/2019   Essential hypertension 03/23/2019   Mass of left kidney 03/23/2019   Nausea without vomiting 03/23/2019   Hyperlipidemia 03/23/2019   Osteoarthritis 03/23/2019   Trigeminal neuralgia 03/23/2019   Nephrolithiasis 03/23/2019   Migraine 03/23/2019   OSA on CPAP 03/23/2019   Fibrocystic breast 03/23/2019   Heart murmur 03/23/2019   Major depressive disorder, recurrent (HCC) 03/18/2017   Morbid obesity (HCC) 03/18/2017   Neuropathic pain 11/19/2016   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HCC) 01/13/2015   Lumbar disc disease with radiculopathy 07/26/2013   S/P liver transplant (HCC) 07/26/2013   Facial nerve disorder 06/29/2012   Low back pain 12/26/2010    Social History   Tobacco Use   Smoking status: Never    Passive exposure: Past   Smokeless tobacco: Never  Substance Use Topics   Alcohol use: Not Currently     Current Outpatient Medications:    Accu-Chek Softclix Lancets lancets, 2 (two) times daily., Disp: , Rfl:    albuterol  (VENTOLIN  HFA) 108 (90 Base) MCG/ACT inhaler, Inhale 2 puffs into the lungs every 6 (six) hours as needed for wheezing or shortness of breath., Disp: 8 g, Rfl: 0   baclofen  (LIORESAL ) 10 MG tablet, Take 1 tablet by mouth 2 (two) times daily., Disp: , Rfl:    benzonatate  (TESSALON ) 100 MG capsule, Take 2 capsules (200 mg total) by mouth 2 (two) times daily as needed for cough., Disp: 20 capsule, Rfl: 0   benztropine  (COGENTIN ) 1 MG tablet, TAKE 1 TABLET BY MOUTH ONCE DAILY AS NEEDED FOR TREMORS, Disp: 30 tablet, Rfl: 10   carvedilol (COREG) 3.125 MG tablet, , Disp: , Rfl:    Cholecalciferol  (VITAMIN D ) 125  MCG (5000 UT) CAPS, Take 5,000 Units by mouth daily., Disp: , Rfl:    Cranberry-Vitamin C-Probiotic (AZO CRANBERRY PO), Take by mouth., Disp: , Rfl:    ezetimibe  (ZETIA ) 10 MG tablet, Take 10 mg by mouth every morning., Disp: , Rfl:    fluticasone  (FLONASE ) 50 MCG/ACT nasal spray, Place into the nose., Disp: , Rfl:    glipiZIDE  (GLUCOTROL  XL) 10 MG 24 hr tablet, Take 1 tablet (10 mg total) by mouth in the morning and at bedtime., Disp: 60 tablet, Rfl: 5   ibuprofen  (ADVIL ) 400 MG tablet, Take 1 tablet (400 mg total) by mouth every 6 (six) hours as needed., Disp: 30 tablet, Rfl: 0   Insulin  Glargine (BASAGLAR  KWIKPEN) 100 UNIT/ML, Inject 40 Units into the skin at bedtime., Disp: , Rfl:    Insulin  Pen Needle 32G X 6 MM MISC, 1 each by Does not apply route as needed., Disp: 100 each, Rfl: 5   lisinopril  (ZESTRIL ) 20 MG tablet, TAKE 1 TABLET BY MOUTH ONCE DAILY, Disp: 90 tablet, Rfl: 0   lurasidone  (LATUDA ) 20 MG TABS tablet, TAKE 1 TABLET BY MOUTH ONCE DAILY WITH SUPPER *REFILL REQUEST*, Disp: 90 tablet, Rfl: 10   magnesium  oxide (MAG-OX) 400 MG tablet, Take 1,000 mg by mouth 2 (two) times  daily., Disp: , Rfl:    Melatonin 10 MG TABS, Take 10 mg by mouth at bedtime., Disp: , Rfl:    metFORMIN  (GLUCOPHAGE ) 1000 MG tablet, TAKE ONE (1) TABLET BY MOUTH TWICE DAILY WITH MEALS, Disp: 180 tablet, Rfl: 0   mirabegron  ER (MYRBETRIQ ) 50 MG TB24 tablet, Take 1 tablet (50 mg total) by mouth daily., Disp: 90 tablet, Rfl: 3   mirtazapine  (REMERON ) 15 MG tablet, TAKE 1 TABLET BY MOUTH EVERY DAY AT BEDTIME *REFILL REQUEST*, Disp: 90 tablet, Rfl: 10   naloxone (NARCAN) nasal spray 4 mg/0.1 mL, Place 0.4 mg into the nose once., Disp: , Rfl:    NOVOLOG  FLEXPEN 100 UNIT/ML FlexPen, Inject 0-30 Units into the skin 3 (three) times daily with meals. Sliding scale, Disp: , Rfl:    ondansetron  (ZOFRAN ) 4 MG tablet, Take 1 tablet (4 mg total) by mouth every 6 (six) hours as needed for nausea., Disp: 20 tablet, Rfl: 0    oxyCODONE  (OXY IR/ROXICODONE ) 5 MG immediate release tablet, Take 5 mg by mouth 3 (three) times daily as needed., Disp: , Rfl:    PRECISION QID TEST test strip, , Disp: , Rfl:    pregabalin  (LYRICA ) 200 MG capsule, Take 1 capsule (200 mg total) by mouth 2 (two) times daily., Disp: 60 capsule, Rfl: 0   rosuvastatin  (CRESTOR ) 10 MG tablet, Take 1 tablet (10 mg total) by mouth daily., Disp: 90 tablet, Rfl: 2   Semaglutide ,0.25 or 0.5MG /DOS, 2 MG/3ML SOPN, Inject 0.25 mg into the skin once a week., Disp: 3 mL, Rfl: 0   tacrolimus  (PROGRAF ) 1 MG capsule, Take 1 capsule by mouth every morning., Disp: , Rfl:    UBRELVY  50 MG TABS, Take 1 tablet by mouth daily as needed., Disp: , Rfl:    venlafaxine  XR (EFFEXOR -XR) 37.5 MG 24 hr capsule, Take 37.5 mg by mouth every morning., Disp: , Rfl:    venlafaxine  XR (EFFEXOR -XR) 75 MG 24 hr capsule, Take 1 capsule (75 mg total) by mouth daily with breakfast. Take along with 37.5 mg daily, Disp: 30 capsule, Rfl: 10   Xylitol (XYLIMELTS) 550 MG DISK, Take by mouth., Disp: , Rfl:   Allergies  Allergen Reactions   Penicillin G Itching, Rash, Anaphylaxis and Palpitations   Lamictal  [Lamotrigine ] Rash   Tape    Carbamazepine Other (See Comments)    Medication interaction-prograf     Hydrocodone-Acetaminophen  Itching   Naproxen Itching   Penicillins    Vancomycin  Rash    Macular/blotchy rash at infusion site    I personally reviewed active problem list, medication list, allergies, notes from last encounter with the patient/caregiver today.  ROS  Ten systems reviewed and is negative except as mentioned in HPI   Objective  Virtual encounter, vitals not obtained.  There is no height or weight on file to calculate BMI.  Nursing Note and Vital Signs reviewed.  Physical Exam  Awake, alert and oriented, speaking in complete sentences   Results for orders placed or performed during the hospital encounter of 02/23/24 (from the past 72 hours)  CBC with  Differential     Status: None   Collection Time: 02/23/24  3:31 AM  Result Value Ref Range   WBC 10.5 4.0 - 10.5 K/uL   RBC 4.27 3.87 - 5.11 MIL/uL   Hemoglobin 12.6 12.0 - 15.0 g/dL   HCT 16.1 09.6 - 04.5 %   MCV 90.4 80.0 - 100.0 fL   MCH 29.5 26.0 - 34.0 pg   MCHC 32.6 30.0 -  36.0 g/dL   RDW 16.1 09.6 - 04.5 %   Platelets 191 150 - 400 K/uL   nRBC 0.0 0.0 - 0.2 %   Neutrophils Relative % 52 %   Neutro Abs 5.5 1.7 - 7.7 K/uL   Lymphocytes Relative 36 %   Lymphs Abs 3.7 0.7 - 4.0 K/uL   Monocytes Relative 8 %   Monocytes Absolute 0.9 0.1 - 1.0 K/uL   Eosinophils Relative 2 %   Eosinophils Absolute 0.2 0.0 - 0.5 K/uL   Basophils Relative 1 %   Basophils Absolute 0.1 0.0 - 0.1 K/uL   Immature Granulocytes 1 %   Abs Immature Granulocytes 0.06 0.00 - 0.07 K/uL    Comment: Performed at [facility], [address] [place], [place] [address]  Basic metabolic panel     Status: Abnormal   Collection Time: 02/23/24  3:31 AM  Result Value Ref Range   Sodium 133 (L) 135 - 145 mmol/L   Potassium 4.7 3.5 - 5.1 mmol/L   Chloride 99 98 - 111 mmol/L   CO2 24 22 - 32 mmol/L   Glucose, Bld 241 (H) 70 - 99 mg/dL    Comment: Glucose reference range applies only to samples taken after fasting for at least 8 hours.   BUN 20 6 - 20 mg/dL   Creatinine, Ser 1.91 (H) 0.44 - 1.00 mg/dL   Calcium  8.9 8.9 - 10.3 mg/dL   GFR, Estimated 53 (L) >60 mL/min    Comment: (NOTE) Calculated using the CKD-EPI Creatinine Equation (2021)    Anion gap 10 5 - 15    Comment: Performed at [facility], [address] [place], [place] [address]  Lactic acid, plasma     Status: Abnormal   Collection Time: 02/23/24  3:31 AM  Result Value Ref Range   Lactic Acid, Venous 2.2 (HH) 0.5 - 1.9 mmol/L    Comment: CRITICAL RESULT CALLED TO, READ BACK BY AND VERIFIED WITH DORIAN JAMES, RN 02/23/2024 0403 JL Performed at [facility], [address] [place], [place] [address]   Troponin I  (High Sensitivity)     Status: None   Collection Time: 02/23/24  3:31 AM  Result Value Ref Range   Troponin I (High Sensitivity) 7 <18 ng/L    Comment: (NOTE) Elevated high sensitivity troponin I (hsTnI) values and significant  changes across serial measurements may suggest ACS but many other  chronic and acute conditions are known to elevate hsTnI results.  Refer to the "Links" section for chest pain algorithms and additional  guidance. Performed at [facility], [address] [place], [place] [address]   Lactic acid, plasma     Status: None   Collection Time: 02/23/24  5:44 AM  Result Value Ref Range   Lactic Acid, Venous 1.4 0.5 - 1.9 mmol/L    Comment: Performed at [facility], [address] [place], [place] [address]  Troponin I (High Sensitivity)     Status: None   Collection Time: 02/23/24  5:44 AM  Result Value Ref Range   Troponin I (High Sensitivity) 6 <18 ng/L    Comment: (NOTE) Elevated high sensitivity troponin I (hsTnI) values and significant  changes across serial measurements may suggest ACS but many other  chronic and acute conditions are known to elevate hsTnI results.  Refer to the "Links" section for chest pain algorithms and additional  guidance. Performed at [facility], [address] [place], [place] [address]  Resp panel by RT-PCR (RSV, Flu A&B, Covid) Anterior Nasal Swab     Status: None   Collection Time: 02/23/24  7:10 AM   Specimen: Anterior Nasal Swab  Result Value Ref Range   SARS Coronavirus 2 by RT PCR NEGATIVE NEGATIVE    Comment: (NOTE) SARS-CoV-2 target nucleic acids are NOT DETECTED.  The SARS-CoV-2 RNA is generally detectable in upper respiratory specimens during the acute phase of infection. The lowest concentration of SARS-CoV-2 viral copies this assay can detect is 138 copies/mL. A negative result does not preclude SARS-Cov-2 infection and should not be used as the sole basis for treatment  or other patient management decisions. A negative result may occur with  improper specimen collection/handling, submission of specimen other than nasopharyngeal swab, presence of viral mutation(s) within the areas targeted by this assay, and inadequate number of viral copies(<138 copies/mL). A negative result must be combined with clinical observations, patient history, and epidemiological information. The expected result is Negative.  Fact Sheet for Patients:  BloggerCourse.com  Fact Sheet for Healthcare Providers:  SeriousBroker.it  This test is no t yet approved or cleared by the [place]  FDA and  has been authorized for detection and/or diagnosis of SARS-CoV-2 by FDA under an Emergency Use Authorization (EUA). This EUA will remain  in effect (meaning this test can be used) for the duration of the COVID-19 declaration under Section 564(b)(1) of the Act, 21 U.S.C.section 360bbb-3(b)(1), unless the authorization is terminated  or revoked sooner.       Influenza A by PCR NEGATIVE NEGATIVE   Influenza B by PCR NEGATIVE NEGATIVE    Comment: (NOTE) The Xpert Xpress SARS-CoV-2/FLU/RSV plus assay is intended as an aid in the diagnosis of influenza from Nasopharyngeal swab specimens and should not be used as a sole basis for treatment. Nasal washings and aspirates are unacceptable for Xpert Xpress SARS-CoV-2/FLU/RSV testing.  Fact Sheet for Patients: BloggerCourse.com  Fact Sheet for Healthcare Providers: SeriousBroker.it  This test is not yet approved or cleared by the [place]  FDA and has been authorized for detection and/or diagnosis of SARS-CoV-2 by FDA under an Emergency Use Authorization (EUA). This EUA will remain in effect (meaning this test can be used) for the duration of the COVID-19 declaration under Section 564(b)(1) of the Act, 21 U.S.C. section  360bbb-3(b)(1), unless the authorization is terminated or revoked.     Resp Syncytial Virus by PCR NEGATIVE NEGATIVE    Comment: (NOTE) Fact Sheet for Patients: BloggerCourse.com  Fact Sheet for Healthcare Providers: SeriousBroker.it  This test is not yet approved or cleared by the [place]  FDA and has been authorized for detection and/or diagnosis of SARS-CoV-2 by FDA under an Emergency Use Authorization (EUA). This EUA will remain in effect (meaning this test can be used) for the duration of the COVID-19 declaration under Section 564(b)(1) of the Act, 21 U.S.C. section 360bbb-3(b)(1), unless the authorization is terminated or revoked.  Performed at [facility], [address] [place], [place] [address]     Assessment & Plan  Assessment and Plan Assessment & Plan Urinary tract infection Symptoms of fever, urgency, and dysuria for 48 hours suggest a urinary tract infection. - Perform urinalysis and send urine for culture. -start macrobid   Chest pain Intermittent chest pain exacerbated by movement and deep inhalation. Recent emergency room visit with normal cardiac workup. Scheduled to see cardiologist next week. - Follow up with cardiologist next week.  Type 2 diabetes mellitus with hyperglycemia Type 2 diabetes mellitus with hyperglycemia,  currently managed with insulin  and oral hypoglycemics. Last A1c was 11.8, indicating poor glycemic control. Followed by endocrinology for diabetes management.  glipizide  10 mg twice daily, Basaglar  insulin  40 units at bedtime, metformin  1000 mg twice daily,  NovoLog  sliding scale three times a day, Ozempic  0.25 mg weekly.  Chronic pain Chronic pain managed with oxycodone  and Lyrica .  Overactive bladder Overactive bladder managed with Myrbetriq .  Bipolar -managed by psychiatry Cogentin  1 mg daily,  Latuda  20 mg daily, Remeron  15 mg daily,and Effexor  37.5 mg  daily.  HTN  Continue carvedilol 3.125 mg daily, lisinopril  20 mg daily  Hld rosuvastatin  10 mg daily, zetia  10 mg daily   Migraines -managed by neurology -baclofen  10 mg twice daily, ubrelvy    -Red flags and when to present for emergency care or RTC including fever >101.19F, chest pain, shortness of breath, new/worsening/un-resolving symptoms,  reviewed with patient at time of visit. Follow up and care instructions discussed and provided in AVS. - I discussed the assessment and treatment plan with the patient. The patient was provided an opportunity to ask questions and all were answered. The patient agreed with the plan and demonstrated an understanding of the instructions.  I provided 20 minutes of non-face-to-face time during this encounter.  Quinton Buckler, FNP

## 2024-02-26 ENCOUNTER — Encounter: Payer: Self-pay | Admitting: *Deleted

## 2024-02-26 ENCOUNTER — Other Ambulatory Visit: Payer: Self-pay | Admitting: Nurse Practitioner

## 2024-02-26 ENCOUNTER — Other Ambulatory Visit: Payer: Self-pay

## 2024-02-26 ENCOUNTER — Telehealth: Payer: Self-pay

## 2024-02-26 ENCOUNTER — Encounter: Payer: Self-pay | Admitting: Oncology

## 2024-02-26 ENCOUNTER — Emergency Department
Admission: EM | Admit: 2024-02-26 | Discharge: 2024-02-26 | Disposition: A | Discharge status: Home / Self Care | Attending: Emergency Medicine | Admitting: Emergency Medicine

## 2024-02-26 ENCOUNTER — Ambulatory Visit: Admitting: Nurse Practitioner

## 2024-02-26 ENCOUNTER — Emergency Department

## 2024-02-26 DIAGNOSIS — F331 Major depressive disorder, recurrent, moderate: Secondary | ICD-10-CM | POA: Diagnosis not present

## 2024-02-26 DIAGNOSIS — R071 Chest pain on breathing: Secondary | ICD-10-CM | POA: Diagnosis not present

## 2024-02-26 DIAGNOSIS — I517 Cardiomegaly: Secondary | ICD-10-CM | POA: Diagnosis not present

## 2024-02-26 DIAGNOSIS — I10 Essential (primary) hypertension: Secondary | ICD-10-CM

## 2024-02-26 DIAGNOSIS — E1122 Type 2 diabetes mellitus with diabetic chronic kidney disease: Secondary | ICD-10-CM

## 2024-02-26 DIAGNOSIS — E1165 Type 2 diabetes mellitus with hyperglycemia: Secondary | ICD-10-CM

## 2024-02-26 DIAGNOSIS — F411 Generalized anxiety disorder: Secondary | ICD-10-CM | POA: Diagnosis not present

## 2024-02-26 DIAGNOSIS — F4310 Post-traumatic stress disorder, unspecified: Secondary | ICD-10-CM | POA: Diagnosis not present

## 2024-02-26 DIAGNOSIS — R091 Pleurisy: Secondary | ICD-10-CM | POA: Diagnosis not present

## 2024-02-26 DIAGNOSIS — R079 Chest pain, unspecified: Secondary | ICD-10-CM | POA: Diagnosis present

## 2024-02-26 LAB — BASIC METABOLIC PANEL WITH GFR
Anion gap: 9 (ref 5–15)
BUN: 14 mg/dL (ref 6–20)
CO2: 26 mmol/L (ref 22–32)
Calcium: 9.1 mg/dL (ref 8.9–10.3)
Chloride: 103 mmol/L (ref 98–111)
Creatinine, Ser: 1.08 mg/dL — ABNORMAL HIGH (ref 0.44–1.00)
GFR, Estimated: >60 mL/min (ref >60)
Glucose, Bld: 120 mg/dL — ABNORMAL HIGH (ref 70–99)
Potassium: 4.5 mmol/L (ref 3.5–5.1)
Sodium: 138 mmol/L (ref 135–145)

## 2024-02-26 LAB — CBC
HCT: 38 % (ref 36.0–46.0)
Hemoglobin: 12.7 g/dL (ref 12.0–15.0)
MCH: 29.1 pg (ref 26.0–34.0)
MCHC: 33.4 g/dL (ref 30.0–36.0)
MCV: 87 fL (ref 80.0–100.0)
Platelets: 218 10*3/uL (ref 150–400)
RBC: 4.37 MIL/uL (ref 3.87–5.11)
RDW: 13.1 % (ref 11.5–15.5)
WBC: 7.7 10*3/uL (ref 4.0–10.5)
nRBC: 0 % (ref 0.0–0.2)

## 2024-02-26 LAB — LACTIC ACID, PLASMA: Lactic Acid, Venous: 1.6 mmol/L (ref 0.5–1.9)

## 2024-02-26 LAB — TROPONIN I (HIGH SENSITIVITY): Troponin I (High Sensitivity): 5 ng/L (ref <18)

## 2024-02-26 MED ORDER — PREDNISONE 10 MG (21) PO TBPK: ORAL_TABLET | ORAL | 0 refills | Status: DC

## 2024-02-26 MED ORDER — MORPHINE SULFATE (PF) 4 MG/ML IV SOLN
4.0000 mg | Freq: Once | INTRAVENOUS | Status: AC
  Administered 2024-02-26: 4 mg via INTRAMUSCULAR
  Filled 2024-02-26: qty 1

## 2024-02-26 NOTE — Telephone Encounter (Signed)
 Ct chest abdomen pelvis with contrast in 6 months and see me 2 weeks after ct

## 2024-02-26 NOTE — ED Triage Notes (Signed)
 Pt ambulatory t o triage.  Pt has reports left side chest pain.  Sx for several days.  Pt was seen here 2 days ago with same sx.  Pt also reports sob.  Nonsmoker.  No cough.  Hx uti  pt taking abx.  Pt alert

## 2024-02-26 NOTE — ED Provider Triage Note (Signed)
 Emergency Medicine Provider Triage Evaluation Note  Alison [name], a 48 y.o. female  was evaluated in triage.  Pt complains of left-sided chest pain.  Patient endorsed onset of symptoms 4 days prior patient was evaluated in the ED 2 days ago with a chest pain workup including a negative CTA.  Patient presented febrile at that time, but fevers resolved with otherwise negative and reassuring workup for sepsis.  She returns to the ED continuing to endorse some left-sided chest pain.  She has not had a chance to follow-up with her cardiologist and oncologist as suggested.  Patient denies any NVD, hemoptysis, cough, or congestion  Review of Systems  Positive: CP fevers Negative:  NVD   Physical Exam  BP 129/89 (BP Location: Left Arm)   Pulse 90   Temp (!) 100.5 F (38.1 C) (Oral)   Resp 18   Ht 5\' 6"  (1.676 m)   Wt 113.4 kg   LMP 10/16/2021 (Exact Date) Comment: Seen by OB GYN on 10/16/21 for 6 days of excessive bleeding as a post menapause  SpO2 99%   BMI 40.35 kg/m  Gen:   Awake, no distress  NAD Resp:  Normal effort CTA MSK:   Moves extremities without difficulty  CVS:  RRR  Medical Decision Making  Medically screening exam initiated at 4:05 PM.  Appropriate orders placed.  Alison [name] was informed that the remainder of the evaluation will be completed by another provider, this initial triage assessment does not replace that evaluation, and the importance of remaining in the ED until their evaluation is complete.  Patient returns to the ED for reevaluation of ongoing central chest pain with a workup 2 days prior including CTA.   May Sparks, PA-C 02/26/24 1616

## 2024-02-26 NOTE — ED Provider Notes (Signed)
 [facility] Provider Note    Event Date/Time   First MD Initiated Contact with Patient 02/26/24 1807     (approximate)   History   Chest Pain   HPI  Alison [name] is a 48 y.o. female who presents to the emergency department today because of concerns for continued left sided chest pain.  The pain does come and go.  It is worse with deep breaths.  Patient was seen in the emergency department 2 days ago for this.  Had negative CT angio and blood work at that time.  Patient states that she has been having fevers although is currently being treated for UTI as well.  Patient denies any nausea or vomiting.     Physical Exam   Triage Vital Signs: ED Triage Vitals  Encounter Vitals Group     BP 02/26/24 1557 129/89     Systolic BP Percentile --      Diastolic BP Percentile --      Pulse Rate 02/26/24 1557 90     Resp 02/26/24 1557 18     Temp 02/26/24 1557 (!) 100.5 F (38.1 C)     Temp Source 02/26/24 1557 Oral     SpO2 02/26/24 1557 99 %     Weight 02/26/24 1554 250 lb (113.4 kg)     Height 02/26/24 1554 5\' 6"  (1.676 m)     Head Circumference --      Peak Flow --      Pain Score 02/26/24 1554 6     Pain Loc --      Pain Education --      Exclude from Growth Chart --     Most recent vital signs: Vitals:   02/26/24 1557  BP: 129/89  Pulse: 90  Resp: 18  Temp: (!) 100.5 F (38.1 C)  SpO2: 99%   General: Awake, alert, oriented. CV:  Good peripheral perfusion. Regular rate and rhythm. Resp:  Normal effort. Lungs clear. Abd:  No distention.  Other:  No rash.   ED Results / Procedures / Treatments   Labs (all labs ordered are listed, but only abnormal results are displayed) Labs Reviewed  BASIC METABOLIC PANEL WITH GFR - Abnormal; Notable for the following components:      Result Value   Glucose, Bld 120 (*)    Creatinine, Ser 1.08 (*)    All other components within normal limits  CBC  LACTIC ACID, PLASMA  LACTIC ACID, PLASMA   TROPONIN I (HIGH SENSITIVITY)  TROPONIN I (HIGH SENSITIVITY)     EKG  I, Marylynn Soho, attending physician, personally viewed and interpreted this EKG  EKG Time: 1600 Rate: 90 Rhythm: normal sinus rhythm Axis: left axis deviation Intervals: qtc 523 QRS: RBBB ST changes: no st elevation Impression: abnormal ekg  RADIOLOGY I independently interpreted and visualized the CXR. My interpretation: No pneumonia Radiology interpretation:  IMPRESSION:  No active cardiopulmonary disease. Cardiomegaly.     PROCEDURES:  Critical Care performed: No    MEDICATIONS ORDERED IN ED: Medications - No data to display   IMPRESSION / MDM / ASSESSMENT AND PLAN / ED COURSE  I reviewed the triage vital signs and the nursing notes.                              Differential diagnosis includes, but is not limited to, pleurisy, pneumonia, shingles, costochondritis, pericarditis  Patient's presentation is most consistent with acute  presentation with potential threat to life or bodily function.   Patient presents to the emergency department today with concerns for continued left chest wall pain.  She does not say it is necessarily getting any worse nor is it getting any better.  It is worse with movement.  Exam patient lungs are clear.  Regular rate and rhythm.  No rash.  EKG chest x-ray blood work without concerning abnormalities.  On chart review she did have CT angio performed 2 days ago.  Did not show any PE or pneumonia.  At this time given reassuring workup I do not feel repeat CT angio is necessary.  Do wonder if patient could be suffering from pleurisy patient has a very mild fever here could be viral illness or could be related to UTI.  This time I do think is reasonable for patient be discharged.  She states she is not able to take NSAIDs.  Will give course of steroids to help with possible inflammation.     FINAL CLINICAL IMPRESSION(S) / ED DIAGNOSES   Final diagnoses:  Pleurisy         Rx / DC Orders   Note:  This document was prepared using Dragon voice recognition software and may include unintentional dictation errors.    Marylynn Soho, MD 02/26/24 [phone]

## 2024-02-27 ENCOUNTER — Telehealth: Payer: Self-pay

## 2024-02-27 DIAGNOSIS — Z08 Encounter for follow-up examination after completed treatment for malignant neoplasm: Secondary | ICD-10-CM

## 2024-02-27 LAB — URINE CULTURE
MICRO NUMBER:: 16485684
SPECIMEN QUALITY:: ADEQUATE

## 2024-02-27 NOTE — Telephone Encounter (Signed)
 Requested Prescriptions  Pending Prescriptions Disp Refills   metFORMIN  (GLUCOPHAGE ) 1000 MG tablet [Pharmacy Med Name: METFORMIN  1000MG  TABLET 1000 Tablet] 180 tablet 0    Sig: TAKE 1 TABLET BY MOUTH TWICE DAILY WITH MEALS *PATIENT WILL NEED TO SCHEDULE APPOINTMENT FOR ADDITIONAL REFILLS*     Endocrinology:  Diabetes - Biguanides Failed - 02/27/2024  5:43 PM      Failed - Cr in normal range and within 360 days    Creat  Date Value Ref Range Status  11/13/2023 1.01 0.50 - 1.03 mg/dL Final   Creatinine, Ser  Date Value Ref Range Status  02/26/2024 1.08 (H) 0.44 - 1.00 mg/dL Final   Creatinine, Urine  Date Value Ref Range Status  11/13/2023 75 20 - 275 mg/dL Final         Failed - HBA1C is between 0 and 7.9 and within 180 days    Hemoglobin A1C  Date Value Ref Range Status  03/06/2022 6.4  Final   Hgb A1c MFr Bld  Date Value Ref Range Status  11/13/2023 11.8 (H) <5.7 % of total Hgb Final    Comment:    For someone without known diabetes, a hemoglobin A1c value of 6.5% or greater indicates that they may have  diabetes and this should be confirmed with a follow-up  test. . For someone with known diabetes, a value <7% indicates  that their diabetes is well controlled and a value  greater than or equal to 7% indicates suboptimal  control. A1c targets should be individualized based on  duration of diabetes, age, comorbid conditions, and  other considerations. . Currently, no consensus exists regarding use of hemoglobin A1c for diagnosis of diabetes for children. .          Failed - B12 Level in normal range and within 720 days    No results found for: "VITAMINB12"       Failed - Valid encounter within last 6 months    Recent Outpatient Visits           2 days ago Essential hypertension   [facility] Quinton Buckler, FNP   1 month ago Mass of upper inner quadrant of right breast   [facility] Quinton Buckler, FNP        Future Appointments             In 5 days Florette Hurry, NP [facility] - eGFR in normal range and within 360 days    GFR, Est African American  Date Value Ref Range Status  12/11/2020 74 > OR = 60 mL/min/1.54m2 Final   GFR, Est Non African American  Date Value Ref Range Status  12/11/2020 64 > OR = 60 mL/min/1.76m2 Final   GFR, Estimated  Date Value Ref Range Status  02/26/2024 >60 >60 mL/min Final    Comment:    (NOTE) Calculated using the CKD-EPI Creatinine Equation (2021)    eGFR  Date Value Ref Range Status  05/27/2022 29 (L) > OR = 60 mL/min/1.69m2 Final         Passed - CBC within normal limits and completed in the last 12 months    WBC  Date Value Ref Range Status  02/26/2024 7.7 4.0 - 10.5 K/uL Final   RBC  Date Value Ref Range Status  02/26/2024 4.37 3.87 - 5.11 MIL/uL Final   Hemoglobin  Date Value Ref Range Status  02/26/2024 12.7 12.0 - 15.0 g/dL Final  16/07/9603 54.0 11.1 - 15.9 g/dL Final   HCT  Date Value Ref Range Status  02/26/2024 38.0 36.0 - 46.0 % Final   Hematocrit  Date Value Ref Range Status  10/16/2021 44.0 34.0 - 46.6 % Final   MCHC  Date Value Ref Range Status  02/26/2024 33.4 30.0 - 36.0 g/dL Final   [facility]  Date Value Ref Range Status  02/26/2024 29.1 26.0 - 34.0 pg Final   MCV  Date Value Ref Range Status  02/26/2024 87.0 80.0 - 100.0 fL Final  10/16/2021 88 79 - 97 fL Final   No results found for: "PLTCOUNTKUC", "LABPLAT", "POCPLA" RDW  Date Value Ref Range Status  02/26/2024 13.1 11.5 - 15.5 % Final  10/16/2021 13.7 11.7 - 15.4 % Final          lisinopril  (ZESTRIL ) 20 MG tablet [Pharmacy Med Name: LISINOPRIL  20 MG TABLET 20 Tablet] 90 tablet 0    Sig: TAKE 1 TABLET BY MOUTH ONCE DAILY *PATIENT WILL NEED TO SCHEDULE AN APPOINTMENT FOR ADDITIONAL REFILLS*     Cardiovascular:  ACE Inhibitors Failed - 02/27/2024  5:43 PM      Failed - Cr in normal  range and within 180 days    Creat  Date Value Ref Range Status  11/13/2023 1.01 0.50 - 1.03 mg/dL Final   Creatinine, Ser  Date Value Ref Range Status  02/26/2024 1.08 (H) 0.44 - 1.00 mg/dL Final   Creatinine, Urine  Date Value Ref Range Status  11/13/2023 75 20 - 275 mg/dL Final         Failed - Valid encounter within last 6 months    Recent Outpatient Visits           2 days ago Essential hypertension   [facility] Quinton Buckler, FNP   1 month ago Mass of upper inner quadrant of right breast   [facility] Quinton Buckler, FNP       Future Appointments             In 5 days Florette Hurry, NP [facility] - K in normal range and within 180 days    Potassium  Date Value Ref Range Status  02/26/2024 4.5 3.5 - 5.1 mmol/L Final         Passed - Patient is not pregnant      Passed - Last BP in normal range    BP Readings from Last 1 Encounters:  02/26/24 121/62

## 2024-02-27 NOTE — Telephone Encounter (Signed)
 Per Dr. Randy Buttery "Ct chest abdomen pelvis with contrast in 6 months and see me 2 weeks after ct".  Order in system.  Will send patient a my chart message with the details above.

## 2024-03-02 ENCOUNTER — Ambulatory Visit

## 2024-03-02 ENCOUNTER — Ambulatory Visit: Payer: Self-pay | Admitting: Nurse Practitioner

## 2024-03-02 ENCOUNTER — Ambulatory Visit: Admitting: Physical Therapy

## 2024-03-03 ENCOUNTER — Ambulatory Visit: Admitting: Nurse Practitioner

## 2024-03-03 ENCOUNTER — Other Ambulatory Visit: Payer: Self-pay

## 2024-03-03 ENCOUNTER — Inpatient Hospital Stay: Payer: Medicaid Other | Attending: Oncology

## 2024-03-03 ENCOUNTER — Other Ambulatory Visit: Payer: Self-pay | Admitting: Lab

## 2024-03-03 DIAGNOSIS — Z9221 Personal history of antineoplastic chemotherapy: Secondary | ICD-10-CM | POA: Diagnosis not present

## 2024-03-03 DIAGNOSIS — Z8572 Personal history of non-Hodgkin lymphomas: Secondary | ICD-10-CM | POA: Diagnosis not present

## 2024-03-03 DIAGNOSIS — Z08 Encounter for follow-up examination after completed treatment for malignant neoplasm: Secondary | ICD-10-CM

## 2024-03-03 LAB — CMP (CANCER CENTER ONLY)
ALT: 19 U/L (ref 0–44)
AST: 22 U/L (ref 15–41)
Albumin: 3.4 g/dL — ABNORMAL LOW (ref 3.5–5.0)
Alkaline Phosphatase: 78 U/L (ref 38–126)
Anion gap: 9 (ref 5–15)
BUN: 28 mg/dL — ABNORMAL HIGH (ref 6–20)
CO2: 25 mmol/L (ref 22–32)
Calcium: 8.7 mg/dL — ABNORMAL LOW (ref 8.9–10.3)
Chloride: 98 mmol/L (ref 98–111)
Creatinine: 1.1 mg/dL — ABNORMAL HIGH (ref 0.44–1.00)
GFR, Estimated: >60 mL/min (ref >60)
Glucose, Bld: 251 mg/dL — ABNORMAL HIGH (ref 70–99)
Potassium: 3.7 mmol/L (ref 3.5–5.1)
Sodium: 132 mmol/L — ABNORMAL LOW (ref 135–145)
Total Bilirubin: 0.5 mg/dL (ref 0.0–1.2)
Total Protein: 6.4 g/dL — ABNORMAL LOW (ref 6.5–8.1)

## 2024-03-03 LAB — CBC WITH DIFFERENTIAL (CANCER CENTER ONLY)
Abs Immature Granulocytes: 0.33 10*3/uL — ABNORMAL HIGH (ref 0.00–0.07)
Basophils Absolute: 0 10*3/uL (ref 0.0–0.1)
Basophils Relative: 0 %
Eosinophils Absolute: 0.1 10*3/uL (ref 0.0–0.5)
Eosinophils Relative: 1 %
HCT: 35.2 % — ABNORMAL LOW (ref 36.0–46.0)
Hemoglobin: 11.9 g/dL — ABNORMAL LOW (ref 12.0–15.0)
Immature Granulocytes: 4 %
Lymphocytes Relative: 37 %
Lymphs Abs: 3.2 10*3/uL (ref 0.7–4.0)
MCH: 28.6 pg (ref 26.0–34.0)
MCHC: 33.8 g/dL (ref 30.0–36.0)
MCV: 84.6 fL (ref 80.0–100.0)
Monocytes Absolute: 0.5 10*3/uL (ref 0.1–1.0)
Monocytes Relative: 6 %
Neutro Abs: 4.4 10*3/uL (ref 1.7–7.7)
Neutrophils Relative %: 52 %
Platelet Count: 223 10*3/uL (ref 150–400)
RBC: 4.16 MIL/uL (ref 3.87–5.11)
RDW: 13.2 % (ref 11.5–15.5)
WBC Count: 8.5 10*3/uL (ref 4.0–10.5)
nRBC: 0 % (ref 0.0–0.2)

## 2024-03-03 NOTE — Telephone Encounter (Signed)
 Last Read in MyChart 02/27/2024  2:53 PM by Vonzella Guernsey

## 2024-03-04 ENCOUNTER — Telehealth: Payer: Self-pay

## 2024-03-04 ENCOUNTER — Encounter

## 2024-03-04 LAB — KAPPA/LAMBDA LIGHT CHAINS
Kappa free light chain: 30.7 mg/L — ABNORMAL HIGH (ref 3.3–19.4)
Kappa, lambda light chain ratio: 1.15 (ref 0.26–1.65)
Lambda free light chains: 26.6 mg/L — ABNORMAL HIGH (ref 5.7–26.3)

## 2024-03-04 NOTE — Progress Notes (Signed)
 error

## 2024-03-05 ENCOUNTER — Ambulatory Visit: Admitting: Physical Therapy

## 2024-03-05 ENCOUNTER — Ambulatory Visit

## 2024-03-07 LAB — MULTIPLE MYELOMA PANEL, SERUM
Albumin SerPl Elph-Mcnc: 3.1 g/dL (ref 2.9–4.4)
Albumin/Glob SerPl: 1 (ref 0.7–1.7)
Alpha 1: 0.3 g/dL (ref 0.0–0.4)
Alpha2 Glob SerPl Elph-Mcnc: 1.1 g/dL — ABNORMAL HIGH (ref 0.4–1.0)
B-Globulin SerPl Elph-Mcnc: 0.9 g/dL (ref 0.7–1.3)
Gamma Glob SerPl Elph-Mcnc: 1 g/dL (ref 0.4–1.8)
Globulin, Total: 3.3 g/dL (ref 2.2–3.9)
IgA: 175 mg/dL (ref 87–352)
IgG (Immunoglobin G), Serum: 876 mg/dL (ref 586–1602)
IgM (Immunoglobulin M), Srm: 290 mg/dL — ABNORMAL HIGH (ref 26–217)
M Protein SerPl Elph-Mcnc: 0.5 g/dL — ABNORMAL HIGH
Total Protein ELP: 6.4 g/dL (ref 6.0–8.5)

## 2024-03-08 ENCOUNTER — Ambulatory Visit: Admitting: Physical Therapy

## 2024-03-08 ENCOUNTER — Telehealth: Payer: Self-pay

## 2024-03-08 ENCOUNTER — Ambulatory Visit

## 2024-03-08 NOTE — Progress Notes (Signed)
 Complex Care Management Care Guide Note  03/08/2024 Name: Alison [name] MRN: [id number] DOB: Aug 23, 1976  Alison [name] is a 48 y.o. year old female who is a primary care patient of Quinton Buckler, FNP and is actively engaged with the care management team. I reached out to Alison [name] by phone today to assist with re-scheduling  with the RN Case Manager.  Follow up plan: Unsuccessful telephone outreach attempt made. A HIPAA compliant phone message was left for the patient providing contact information and requesting a return call.  Lenton Rail , RMA     [facility]  [facility], [facility] Guide  Direct Dial: [phone]  Website: [email or web address]

## 2024-03-10 ENCOUNTER — Encounter: Admitting: Physical Therapy

## 2024-03-10 ENCOUNTER — Encounter

## 2024-03-11 ENCOUNTER — Ambulatory Visit: Admitting: Psychiatry

## 2024-03-12 ENCOUNTER — Encounter: Admitting: Physical Therapy

## 2024-03-12 ENCOUNTER — Encounter

## 2024-03-14 ENCOUNTER — Emergency Department

## 2024-03-14 ENCOUNTER — Other Ambulatory Visit: Payer: Self-pay

## 2024-03-14 ENCOUNTER — Inpatient Hospital Stay
Admission: EM | Admit: 2024-03-14 | Discharge: 2024-03-16 | DRG: 871 | Disposition: A | Discharge status: Home / Self Care | Attending: Obstetrics and Gynecology | Admitting: Obstetrics and Gynecology

## 2024-03-14 DIAGNOSIS — E1165 Type 2 diabetes mellitus with hyperglycemia: Secondary | ICD-10-CM | POA: Diagnosis present

## 2024-03-14 DIAGNOSIS — F4310 Post-traumatic stress disorder, unspecified: Secondary | ICD-10-CM | POA: Diagnosis present

## 2024-03-14 DIAGNOSIS — E872 Acidosis, unspecified: Secondary | ICD-10-CM | POA: Diagnosis present

## 2024-03-14 DIAGNOSIS — F1129 Opioid dependence with unspecified opioid-induced disorder: Secondary | ICD-10-CM | POA: Diagnosis present

## 2024-03-14 DIAGNOSIS — Z87442 Personal history of urinary calculi: Secondary | ICD-10-CM

## 2024-03-14 DIAGNOSIS — Z7985 Long-term (current) use of injectable non-insulin antidiabetic drugs: Secondary | ICD-10-CM

## 2024-03-14 DIAGNOSIS — K7460 Unspecified cirrhosis of liver: Secondary | ICD-10-CM | POA: Diagnosis present

## 2024-03-14 DIAGNOSIS — J123 Human metapneumovirus pneumonia: Secondary | ICD-10-CM | POA: Insufficient documentation

## 2024-03-14 DIAGNOSIS — R0602 Shortness of breath: Secondary | ICD-10-CM | POA: Diagnosis not present

## 2024-03-14 DIAGNOSIS — A4189 Other specified sepsis: Principal | ICD-10-CM | POA: Diagnosis present

## 2024-03-14 DIAGNOSIS — Z794 Long term (current) use of insulin: Secondary | ICD-10-CM

## 2024-03-14 DIAGNOSIS — Z87448 Personal history of other diseases of urinary system: Secondary | ICD-10-CM

## 2024-03-14 DIAGNOSIS — Z79621 Long term (current) use of calcineurin inhibitor: Secondary | ICD-10-CM

## 2024-03-14 DIAGNOSIS — K754 Autoimmune hepatitis: Secondary | ICD-10-CM | POA: Diagnosis present

## 2024-03-14 DIAGNOSIS — K219 Gastro-esophageal reflux disease without esophagitis: Secondary | ICD-10-CM | POA: Diagnosis present

## 2024-03-14 DIAGNOSIS — F3178 Bipolar disorder, in full remission, most recent episode mixed: Secondary | ICD-10-CM | POA: Diagnosis present

## 2024-03-14 DIAGNOSIS — J40 Bronchitis, not specified as acute or chronic: Secondary | ICD-10-CM | POA: Diagnosis not present

## 2024-03-14 DIAGNOSIS — Z818 Family history of other mental and behavioral disorders: Secondary | ICD-10-CM

## 2024-03-14 DIAGNOSIS — A419 Sepsis, unspecified organism: Secondary | ICD-10-CM | POA: Diagnosis present

## 2024-03-14 DIAGNOSIS — R161 Splenomegaly, not elsewhere classified: Secondary | ICD-10-CM | POA: Diagnosis not present

## 2024-03-14 DIAGNOSIS — Z944 Liver transplant status: Secondary | ICD-10-CM

## 2024-03-14 DIAGNOSIS — E119 Type 2 diabetes mellitus without complications: Secondary | ICD-10-CM

## 2024-03-14 DIAGNOSIS — R109 Unspecified abdominal pain: Secondary | ICD-10-CM | POA: Diagnosis present

## 2024-03-14 DIAGNOSIS — Z1152 Encounter for screening for COVID-19: Secondary | ICD-10-CM

## 2024-03-14 DIAGNOSIS — Z881 Allergy status to other antibiotic agents status: Secondary | ICD-10-CM

## 2024-03-14 DIAGNOSIS — G4733 Obstructive sleep apnea (adult) (pediatric): Secondary | ICD-10-CM

## 2024-03-14 DIAGNOSIS — R059 Cough, unspecified: Secondary | ICD-10-CM | POA: Diagnosis not present

## 2024-03-14 DIAGNOSIS — Z888 Allergy status to other drugs, medicaments and biological substances status: Secondary | ICD-10-CM

## 2024-03-14 DIAGNOSIS — E1122 Type 2 diabetes mellitus with diabetic chronic kidney disease: Secondary | ICD-10-CM | POA: Diagnosis present

## 2024-03-14 DIAGNOSIS — J9601 Acute respiratory failure with hypoxia: Secondary | ICD-10-CM | POA: Diagnosis present

## 2024-03-14 DIAGNOSIS — E1140 Type 2 diabetes mellitus with diabetic neuropathy, unspecified: Secondary | ICD-10-CM | POA: Diagnosis present

## 2024-03-14 DIAGNOSIS — C833A Diffuse large b-cell lymphoma, in remission: Secondary | ICD-10-CM | POA: Diagnosis present

## 2024-03-14 DIAGNOSIS — G4700 Insomnia, unspecified: Secondary | ICD-10-CM | POA: Diagnosis present

## 2024-03-14 DIAGNOSIS — Z79899 Other long term (current) drug therapy: Secondary | ICD-10-CM

## 2024-03-14 DIAGNOSIS — R509 Fever, unspecified: Secondary | ICD-10-CM

## 2024-03-14 DIAGNOSIS — G500 Trigeminal neuralgia: Secondary | ICD-10-CM | POA: Diagnosis present

## 2024-03-14 DIAGNOSIS — Z9221 Personal history of antineoplastic chemotherapy: Secondary | ICD-10-CM

## 2024-03-14 DIAGNOSIS — I2510 Atherosclerotic heart disease of native coronary artery without angina pectoris: Secondary | ICD-10-CM | POA: Diagnosis not present

## 2024-03-14 DIAGNOSIS — Z7984 Long term (current) use of oral hypoglycemic drugs: Secondary | ICD-10-CM

## 2024-03-14 DIAGNOSIS — Z886 Allergy status to analgesic agent status: Secondary | ICD-10-CM

## 2024-03-14 DIAGNOSIS — M329 Systemic lupus erythematosus, unspecified: Secondary | ICD-10-CM | POA: Diagnosis present

## 2024-03-14 DIAGNOSIS — R051 Acute cough: Principal | ICD-10-CM

## 2024-03-14 DIAGNOSIS — Z885 Allergy status to narcotic agent status: Secondary | ICD-10-CM

## 2024-03-14 DIAGNOSIS — Z87440 Personal history of urinary (tract) infections: Secondary | ICD-10-CM

## 2024-03-14 DIAGNOSIS — Z8249 Family history of ischemic heart disease and other diseases of the circulatory system: Secondary | ICD-10-CM

## 2024-03-14 DIAGNOSIS — Z820 Family history of epilepsy and other diseases of the nervous system: Secondary | ICD-10-CM

## 2024-03-14 DIAGNOSIS — Z9049 Acquired absence of other specified parts of digestive tract: Secondary | ICD-10-CM

## 2024-03-14 DIAGNOSIS — E785 Hyperlipidemia, unspecified: Secondary | ICD-10-CM | POA: Diagnosis present

## 2024-03-14 DIAGNOSIS — Z833 Family history of diabetes mellitus: Secondary | ICD-10-CM

## 2024-03-14 DIAGNOSIS — C8330 Diffuse large B-cell lymphoma, unspecified site: Secondary | ICD-10-CM | POA: Diagnosis present

## 2024-03-14 DIAGNOSIS — R918 Other nonspecific abnormal finding of lung field: Secondary | ICD-10-CM | POA: Diagnosis not present

## 2024-03-14 DIAGNOSIS — I10 Essential (primary) hypertension: Secondary | ICD-10-CM | POA: Diagnosis present

## 2024-03-14 DIAGNOSIS — D84821 Immunodeficiency due to drugs: Secondary | ICD-10-CM | POA: Diagnosis present

## 2024-03-14 DIAGNOSIS — Z880 Allergy status to penicillin: Secondary | ICD-10-CM

## 2024-03-14 DIAGNOSIS — K7689 Other specified diseases of liver: Secondary | ICD-10-CM | POA: Diagnosis not present

## 2024-03-14 LAB — COMPREHENSIVE METABOLIC PANEL WITH GFR
ALT: 16 U/L (ref 0–44)
AST: 30 U/L (ref 15–41)
Albumin: 3.6 g/dL (ref 3.5–5.0)
Alkaline Phosphatase: 61 U/L (ref 38–126)
Anion gap: 12 (ref 5–15)
BUN: 16 mg/dL (ref 6–20)
CO2: 19 mmol/L — ABNORMAL LOW (ref 22–32)
Calcium: 8.8 mg/dL — ABNORMAL LOW (ref 8.9–10.3)
Chloride: 103 mmol/L (ref 98–111)
Creatinine, Ser: 1.11 mg/dL — ABNORMAL HIGH (ref 0.44–1.00)
GFR, Estimated: >60 mL/min (ref >60)
Glucose, Bld: 270 mg/dL — ABNORMAL HIGH (ref 70–99)
Potassium: 4.4 mmol/L (ref 3.5–5.1)
Sodium: 134 mmol/L — ABNORMAL LOW (ref 135–145)
Total Bilirubin: 0.9 mg/dL (ref 0.0–1.2)
Total Protein: 7 g/dL (ref 6.5–8.1)

## 2024-03-14 LAB — CBC WITH DIFFERENTIAL/PLATELET
Abs Immature Granulocytes: 0.03 10*3/uL (ref 0.00–0.07)
Basophils Absolute: 0 10*3/uL (ref 0.0–0.1)
Basophils Relative: 0 %
Eosinophils Absolute: 0 10*3/uL (ref 0.0–0.5)
Eosinophils Relative: 1 %
HCT: 38.2 % (ref 36.0–46.0)
Hemoglobin: 12.2 g/dL (ref 12.0–15.0)
Immature Granulocytes: 1 %
Lymphocytes Relative: 34 %
Lymphs Abs: 1.8 10*3/uL (ref 0.7–4.0)
MCH: 29 pg (ref 26.0–34.0)
MCHC: 31.9 g/dL (ref 30.0–36.0)
MCV: 90.7 fL (ref 80.0–100.0)
Monocytes Absolute: 0.7 10*3/uL (ref 0.1–1.0)
Monocytes Relative: 14 %
Neutro Abs: 2.7 10*3/uL (ref 1.7–7.7)
Neutrophils Relative %: 50 %
Platelets: 115 10*3/uL — ABNORMAL LOW (ref 150–400)
RBC: 4.21 MIL/uL (ref 3.87–5.11)
RDW: 14.4 % (ref 11.5–15.5)
WBC: 5.3 10*3/uL (ref 4.0–10.5)
nRBC: 0 % (ref 0.0–0.2)

## 2024-03-14 LAB — LACTIC ACID, PLASMA: Lactic Acid, Venous: 2.1 mmol/L (ref 0.5–1.9)

## 2024-03-14 MED ORDER — SODIUM CHLORIDE 0.9 % IV SOLN
2.0000 g | Freq: Once | INTRAVENOUS | Status: AC
  Administered 2024-03-14: 2 g via INTRAVENOUS
  Filled 2024-03-14: qty 20

## 2024-03-14 MED ORDER — IOHEXOL 350 MG/ML SOLN
100.0000 mL | Freq: Once | INTRAVENOUS | Status: AC | PRN
  Administered 2024-03-14: 100 mL via INTRAVENOUS

## 2024-03-14 MED ORDER — SODIUM CHLORIDE 0.9 % IV SOLN
100.0000 mg | Freq: Once | INTRAVENOUS | Status: AC
  Administered 2024-03-15: 100 mg via INTRAVENOUS
  Filled 2024-03-14: qty 100

## 2024-03-14 MED ORDER — MORPHINE SULFATE (PF) 4 MG/ML IV SOLN
4.0000 mg | Freq: Once | INTRAVENOUS | Status: AC
  Administered 2024-03-14: 4 mg via INTRAVENOUS
  Filled 2024-03-14: qty 1

## 2024-03-14 NOTE — ED Triage Notes (Signed)
 POV with CC of fever and cough x1 day. Took 1000mg  Tylenol  prior to triage. 90% RA SpO2.

## 2024-03-14 NOTE — Sepsis Progress Note (Signed)
 Elink following code sepsis

## 2024-03-14 NOTE — ED Provider Notes (Signed)
 Patient with fever cough.  CT imaging in combination with her elevated fever concerning for infection.  Currently receiving antibiotic therapy.  Meets sepsis criteria.  Patient had fever, had tachypnea.  Mild oxygen requirement.  Will admit for further care patient resting at this time on 2 L oxygen, family at bedside.  She is understanding agreeable plan for admission.  Consulted with our hospitalist Dr. Dunan who accepts.      Iver Marker, MD 03/15/24 [phone]

## 2024-03-14 NOTE — ED Notes (Addendum)
 Pt to CT

## 2024-03-14 NOTE — ED Notes (Signed)
 Provider notified pt requesting pain meds for right low /flank pain

## 2024-03-14 NOTE — ED Provider Notes (Signed)
 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  Symptoms started yesterday.  Additionally reports some pain over her right mid back.  Fever of 103 at home, took a gram of Tylenol  prior to presentation.  Denies history of similar.  No abdominal pain, vomiting, diarrhea, dysuria.     Physical Exam   Triage Vital Signs: ED Triage Vitals  Encounter Vitals Group     BP 03/14/24 2113 (!) 142/81     Systolic BP Percentile --      Diastolic BP Percentile --      Pulse Rate 03/14/24 2113 98     Resp 03/14/24 2113 19     Temp 03/14/24 2113 (!) 103.4 F (39.7 C)     Temp Source 03/14/24 2113 Oral     SpO2 03/14/24 2113 90 %     Weight 03/14/24 2117 250 lb (113.4 kg)     Height 03/14/24 2117 5\' 6"  (1.676 m)     Head Circumference --      Peak Flow --      Pain Score 03/14/24 2116 6     Pain Loc --      Pain Education --      Exclude from Growth Chart --     Most recent vital signs: Vitals:   03/14/24 2113 03/14/24 2221  BP: (!) 142/81   Pulse: 98   Resp: 19   Temp: (!) 103.4 F (39.7 C) (!) 101.7 F (38.7 C)  SpO2: 90%      General: Awake, interactive  CV:  Regular rate, good peripheral perfusion.  Resp:  Unlabored respirations, lung sounds coarse bilaterally Abd:  Nondistended, soft, no STEMI tenderness palpation, mild tenderness over the right flank without CVA tenderness Neuro:  Symmetric facial movement, fluid speech   ED Results / Procedures / Treatments   Labs (all labs ordered are listed, but only abnormal results are displayed) Labs Reviewed  LACTIC ACID, PLASMA - Abnormal; Notable for the following components:      Result Value    Lactic Acid, Venous 2.1 (*)    All other components within normal limits  COMPREHENSIVE METABOLIC PANEL WITH GFR - Abnormal; Notable for the following components:   Sodium 134 (*)    CO2 19 (*)    Glucose, Bld 270 (*)    Creatinine, Ser 1.11 (*)    Calcium  8.8 (*)    All other components within normal limits  CBC WITH DIFFERENTIAL/PLATELET - Abnormal; Notable for the following components:   Platelets 115 (*)    All other components within normal limits  RESP PANEL BY RT-PCR (RSV, FLU A&B, COVID)  [id number]  CULTURE, BLOOD (ROUTINE X 2)  CULTURE, BLOOD (ROUTINE X 2)  LACTIC ACID, PLASMA  URINALYSIS, W/ REFLEX TO CULTURE (INFECTION SUSPECTED)     EKG EKG independently reviewed and interpreted by myself demonstrates:    RADIOLOGY Imaging independently reviewed and interpreted by myself demonstrates:  Chest x-Davone Shinault without obvious consolidation CTA of the chest without evidence of PE, radiology does note findings of possible edema versus infection CT abdomen pelvis without acute findings  Formal Radiology Read:  CT ABDOMEN PELVIS W  CONTRAST Result Date: 03/14/2024 CLINICAL DATA:  Abdominal pain, acute, nonlocalized right flank and abd pain EXAM: CT ABDOMEN AND PELVIS WITH CONTRAST TECHNIQUE: Multidetector CT imaging of the abdomen and pelvis was performed using the standard protocol following bolus administration of intravenous contrast. RADIATION DOSE REDUCTION: This exam was performed according to the departmental dose-optimization program which includes automated exposure control, adjustment of the mA and/or kV according to patient size and/or use of iterative reconstruction technique. CONTRAST:  OMNIPAQUE  IOHEXOL  350 MG/ML SOLN COMPARISON:  03/06/2023 FINDINGS: Lower chest: See chest CT report Hepatobiliary: Subtle nodular contours of the liver surface. Recommend clinical correlation for possible early cirrhosis. No focal hepatic abnormality. Prior cholecystectomy. Pancreas: No focal  abnormality or ductal dilatation. Spleen: Splenomegaly with a craniocaudal length of 14 cm. No focal abnormality. Adrenals/Urinary Tract: Atrophy of the left kidney. Areas of scarring in the kidneys bilaterally. Small bilateral renal cysts are stable. No follow-up imaging recommended. No stones or hydronephrosis. Adrenal glands and urinary bladder unremarkable. Stomach/Bowel: Stomach, large and small bowel grossly unremarkable. Vascular/Lymphatic: No evidence of aneurysm or adenopathy. Scattered aortic atherosclerosis. Prominent vessels around the distal esophagus compatible with distal esophageal varices. Reproductive: Uterus and adnexa unremarkable.  No mass. Other: No free fluid or free air. Musculoskeletal: No acute bony abnormality. IMPRESSION: Subtle nodularity to the liver surface suggests cirrhosis. There are distal esophageal varices and mild splenomegaly. No acute findings in the abdomen or pelvis. Electronically Signed   By: Janeece Mechanic M.D.   On: 03/14/2024 22:41   CT Angio Chest PE W and/or Wo Contrast Result Date: 03/14/2024 CLINICAL DATA:  Pulmonary embolism (PE) suspected, high prob. Shortness of breath, fever, cough EXAM: CT ANGIOGRAPHY CHEST WITH CONTRAST TECHNIQUE: Multidetector CT imaging of the chest was performed using the standard protocol during bolus administration of intravenous contrast. Multiplanar CT image reconstructions and MIPs were obtained to evaluate the vascular anatomy. RADIATION DOSE REDUCTION: This exam was performed according to the departmental dose-optimization program which includes automated exposure control, adjustment of the mA and/or kV according to patient size and/or use of iterative reconstruction technique. CONTRAST:  OMNIPAQUE  IOHEXOL  350 MG/ML SOLN COMPARISON:  02/23/2024 FINDINGS: Cardiovascular: No filling defects in the pulmonary arteries to suggest pulmonary emboli. Heart borderline in size. Scattered coronary artery and aortic calcifications.  Mediastinum/Nodes: No mediastinal, hilar, or axillary adenopathy. Trachea and esophagus are unremarkable. Thyroid  unremarkable. Lungs/Pleura: Ground-glass airspace opacities in the left upper lobe and left lower lobe as well as right lower lobe. Findings could reflect early asymmetric edema or infection. No effusions. Upper Abdomen: See abdominal CT report today. Musculoskeletal: Chest wall soft tissues are unremarkable. No acute bony abnormality. Review of the MIP images confirms the above findings. IMPRESSION: No evidence of pulmonary embolus. Borderline cardiomegaly.  Scattered coronary artery calcifications. Scattered ground-glass opacities in the lungs bilaterally, left slightly greater than right. This could reflect early asymmetric edema or infection. Aortic Atherosclerosis (ICD10-I70.0). Electronically Signed   By: Janeece Mechanic M.D.   On: 03/14/2024 22:36   DG Chest Port 1 View Result Date: 03/14/2024 CLINICAL DATA:  Shortness of breath, fever, cough EXAM: PORTABLE CHEST 1 VIEW COMPARISON:  02/26/2024 FINDINGS: Left Port-A-Cath remains in place, unchanged. Heart is borderline in size. Mediastinal contours are within normal limits. Mild peribronchial thickening. No confluent airspace opacities, effusions or edema. No acute bony abnormality. IMPRESSION: Borderline heart size. Mild bronchitic changes. Electronically Signed   By: Janeece Mechanic M.D.   On: 03/14/2024 22:10    PROCEDURES:  Critical Care performed:  Yes, see critical care procedure note(s)  CRITICAL CARE Performed by: Claria Crofts   Total critical care time: 31 minutes  Critical care time was exclusive of separately billable procedures and treating other patients.  Critical care was necessary to treat or prevent imminent or life-threatening deterioration.  Critical care was time spent personally by me on the following activities: development of treatment plan with patient and/or surrogate as well as nursing, discussions with  consultants, evaluation of patient's response to treatment, examination of patient, obtaining history from patient or surrogate, ordering and performing treatments and interventions, ordering and review of laboratory studies, ordering and review of radiographic studies, pulse oximetry and re-evaluation of patient's condition.   Procedures   MEDICATIONS ORDERED IN ED: Medications  doxycycline  (VIBRAMYCIN ) 100 mg in sodium chloride  0.9 % 250 mL IVPB (has no administration in time range)  cefTRIAXone  (ROCEPHIN ) 2 g in sodium chloride  0.9 % 100 mL IVPB (0 g Intravenous Stopped 03/14/24 2258)  iohexol  (OMNIPAQUE ) 350 MG/ML injection 100 mL (100 mLs Intravenous Contrast Given 03/14/24 2221)     IMPRESSION / MDM / ASSESSMENT AND PLAN / ED COURSE  I reviewed the triage vital signs and the nursing notes.  Differential diagnosis includes, but is not limited to, pneumonia, viral illness, intra-abdominal infection, medication adverse effect, electrolyte abnormality  Patient's presentation is most consistent with acute presentation with potential threat to life or bodily function.  48 year old female presenting to the emergency department for evaluation of fever and cough.  Remains febrile on presentation.  History of allergy to naproxen and took Tylenol  prior to presentation, so will monitor fever for now.  Labs with normal white blood cell count and hemoglobin.  CMP without critical derangements.  Lactate mildly elevated at 2.1.  Urinalysis pending.  X-Retina Bernardy without obvious pneumonia, CT of the chest and CT abdomen pelvis ordered to further evaluate.  CTA of the chest with questionable pneumonia, no acute findings on CT abdomen pelvis.  Signed out to oncoming provider pending urinalysis and disposition.  With her fever and immunocompromised status, suspect patient may be appropriate for admission.     FINAL CLINICAL IMPRESSION(S) / ED DIAGNOSES   Final diagnoses:  Acute cough  Fever, unspecified fever  cause     Rx / DC Orders   ED Discharge Orders     None        Note:  This document was prepared using Dragon voice recognition software and may include unintentional dictation errors.   Claria Crofts, MD 03/14/24 [phone]

## 2024-03-14 NOTE — Progress Notes (Signed)
 CODE SEPSIS - PHARMACY COMMUNICATION  **Broad Spectrum Antibiotics should be administered within 1 hour of Sepsis diagnosis**  Time Code Sepsis Called/Page Received:  6/8 @ 2136   Antibiotics Ordered: Ceftriaxone  , doxycycline    Time of 1st antibiotic administration: Ceftriaxone  2 gm IV X 1 on 6/8 @ 2218   Additional action taken by pharmacy:   If necessary, Name of Provider/Nurse Contacted:     Ramonica Grigg D ,PharmD Clinical Pharmacist  03/14/2024  10:26 PM

## 2024-03-15 ENCOUNTER — Encounter

## 2024-03-15 DIAGNOSIS — A419 Sepsis, unspecified organism: Secondary | ICD-10-CM | POA: Diagnosis present

## 2024-03-15 DIAGNOSIS — K7689 Other specified diseases of liver: Secondary | ICD-10-CM | POA: Diagnosis not present

## 2024-03-15 DIAGNOSIS — K7460 Unspecified cirrhosis of liver: Secondary | ICD-10-CM | POA: Diagnosis not present

## 2024-03-15 DIAGNOSIS — J189 Pneumonia, unspecified organism: Secondary | ICD-10-CM

## 2024-03-15 DIAGNOSIS — E1122 Type 2 diabetes mellitus with diabetic chronic kidney disease: Secondary | ICD-10-CM | POA: Diagnosis not present

## 2024-03-15 DIAGNOSIS — Z794 Long term (current) use of insulin: Secondary | ICD-10-CM | POA: Diagnosis not present

## 2024-03-15 DIAGNOSIS — J9601 Acute respiratory failure with hypoxia: Secondary | ICD-10-CM | POA: Diagnosis not present

## 2024-03-15 DIAGNOSIS — E872 Acidosis, unspecified: Secondary | ICD-10-CM | POA: Diagnosis not present

## 2024-03-15 DIAGNOSIS — J123 Human metapneumovirus pneumonia: Secondary | ICD-10-CM | POA: Insufficient documentation

## 2024-03-15 DIAGNOSIS — I2510 Atherosclerotic heart disease of native coronary artery without angina pectoris: Secondary | ICD-10-CM | POA: Diagnosis not present

## 2024-03-15 DIAGNOSIS — R0602 Shortness of breath: Secondary | ICD-10-CM | POA: Diagnosis not present

## 2024-03-15 DIAGNOSIS — A4189 Other specified sepsis: Secondary | ICD-10-CM | POA: Diagnosis not present

## 2024-03-15 DIAGNOSIS — G4733 Obstructive sleep apnea (adult) (pediatric): Secondary | ICD-10-CM | POA: Diagnosis not present

## 2024-03-15 DIAGNOSIS — Z1152 Encounter for screening for COVID-19: Secondary | ICD-10-CM | POA: Diagnosis not present

## 2024-03-15 DIAGNOSIS — G4700 Insomnia, unspecified: Secondary | ICD-10-CM | POA: Diagnosis not present

## 2024-03-15 DIAGNOSIS — R918 Other nonspecific abnormal finding of lung field: Secondary | ICD-10-CM | POA: Diagnosis not present

## 2024-03-15 DIAGNOSIS — J40 Bronchitis, not specified as acute or chronic: Secondary | ICD-10-CM | POA: Diagnosis not present

## 2024-03-15 DIAGNOSIS — K219 Gastro-esophageal reflux disease without esophagitis: Secondary | ICD-10-CM | POA: Diagnosis not present

## 2024-03-15 DIAGNOSIS — E1140 Type 2 diabetes mellitus with diabetic neuropathy, unspecified: Secondary | ICD-10-CM | POA: Diagnosis not present

## 2024-03-15 DIAGNOSIS — F1129 Opioid dependence with unspecified opioid-induced disorder: Secondary | ICD-10-CM | POA: Diagnosis present

## 2024-03-15 DIAGNOSIS — E1165 Type 2 diabetes mellitus with hyperglycemia: Secondary | ICD-10-CM | POA: Diagnosis not present

## 2024-03-15 DIAGNOSIS — Z944 Liver transplant status: Secondary | ICD-10-CM | POA: Diagnosis not present

## 2024-03-15 DIAGNOSIS — I10 Essential (primary) hypertension: Secondary | ICD-10-CM | POA: Diagnosis not present

## 2024-03-15 DIAGNOSIS — R109 Unspecified abdominal pain: Secondary | ICD-10-CM | POA: Diagnosis not present

## 2024-03-15 DIAGNOSIS — M329 Systemic lupus erythematosus, unspecified: Secondary | ICD-10-CM | POA: Diagnosis not present

## 2024-03-15 DIAGNOSIS — R161 Splenomegaly, not elsewhere classified: Secondary | ICD-10-CM | POA: Diagnosis not present

## 2024-03-15 DIAGNOSIS — K754 Autoimmune hepatitis: Secondary | ICD-10-CM | POA: Diagnosis not present

## 2024-03-15 DIAGNOSIS — E785 Hyperlipidemia, unspecified: Secondary | ICD-10-CM | POA: Diagnosis not present

## 2024-03-15 DIAGNOSIS — F3178 Bipolar disorder, in full remission, most recent episode mixed: Secondary | ICD-10-CM | POA: Diagnosis not present

## 2024-03-15 DIAGNOSIS — R509 Fever, unspecified: Secondary | ICD-10-CM | POA: Diagnosis not present

## 2024-03-15 DIAGNOSIS — C833A Diffuse large b-cell lymphoma, in remission: Secondary | ICD-10-CM | POA: Diagnosis not present

## 2024-03-15 DIAGNOSIS — D84821 Immunodeficiency due to drugs: Secondary | ICD-10-CM | POA: Diagnosis not present

## 2024-03-15 DIAGNOSIS — R059 Cough, unspecified: Secondary | ICD-10-CM | POA: Diagnosis not present

## 2024-03-15 LAB — URINALYSIS, W/ REFLEX TO CULTURE (INFECTION SUSPECTED)
Bacteria, UA: NONE SEEN
Bilirubin Urine: NEGATIVE
Glucose, UA: 50 mg/dL — AB
Hgb urine dipstick: NEGATIVE
Ketones, ur: NEGATIVE mg/dL
Leukocytes,Ua: NEGATIVE
Nitrite: NEGATIVE
Protein, ur: 100 mg/dL — AB
Specific Gravity, Urine: 1.033 — ABNORMAL HIGH (ref 1.005–1.030)
pH: 6 (ref 5.0–8.0)

## 2024-03-15 LAB — CORTISOL-AM, BLOOD: Cortisol - AM: 15.2 ug/dL (ref 6.7–22.6)

## 2024-03-15 LAB — PROTIME-INR
INR: 1 (ref 0.8–1.2)
Prothrombin Time: 13.8 s (ref 11.4–15.2)

## 2024-03-15 LAB — RESPIRATORY PANEL BY PCR

## 2024-03-15 LAB — GLUCOSE, CAPILLARY
Glucose-Capillary: 234 mg/dL — ABNORMAL HIGH (ref 70–99)
Glucose-Capillary: 326 mg/dL — ABNORMAL HIGH (ref 70–99)

## 2024-03-15 LAB — RESP PANEL BY RT-PCR (RSV, FLU A&B, COVID)  RVPGX2
Influenza A by PCR: NEGATIVE
Influenza B by PCR: NEGATIVE
Resp Syncytial Virus by PCR: NEGATIVE
SARS Coronavirus 2 by RT PCR: NEGATIVE

## 2024-03-15 LAB — CBG MONITORING, ED: Glucose-Capillary: 287 mg/dL — ABNORMAL HIGH (ref 70–99)

## 2024-03-15 LAB — STREP PNEUMONIAE URINARY ANTIGEN: Strep Pneumo Urinary Antigen: NEGATIVE

## 2024-03-15 LAB — EXPECTORATED SPUTUM ASSESSMENT W GRAM STAIN, RFLX TO RESP C

## 2024-03-15 LAB — MRSA NEXT GEN BY PCR, NASAL: MRSA by PCR Next Gen: NOT DETECTED

## 2024-03-15 LAB — HIV ANTIBODY (ROUTINE TESTING W REFLEX): HIV Screen 4th Generation wRfx: NONREACTIVE

## 2024-03-15 LAB — PROCALCITONIN: Procalcitonin: <0.1 ng/mL

## 2024-03-15 LAB — LACTIC ACID, PLASMA: Lactic Acid, Venous: 1.4 mmol/L (ref 0.5–1.9)

## 2024-03-15 MED ORDER — BACLOFEN 10 MG PO TABS: 10.0000 mg | ORAL_TABLET | Freq: Two times a day (BID) | ORAL | Status: DC | PRN

## 2024-03-15 MED ORDER — ACETAMINOPHEN 325 MG PO TABS
650.0000 mg | ORAL_TABLET | Freq: Four times a day (QID) | ORAL | Status: DC | PRN
Start: 2024-03-15 — End: 2024-03-16
  Administered 2024-03-16: 650 mg via ORAL
  Filled 2024-03-15: qty 2

## 2024-03-15 MED ORDER — INSULIN GLARGINE-YFGN 100 UNIT/ML ~~LOC~~ SOLN
40.0000 [IU] | Freq: Every day | SUBCUTANEOUS | Status: DC
  Administered 2024-03-15: 40 [IU] via SUBCUTANEOUS
  Filled 2024-03-15 (×2): qty 0.4

## 2024-03-15 MED ORDER — VENLAFAXINE HCL ER 75 MG PO CP24
112.5000 mg | ORAL_CAPSULE | Freq: Every day | ORAL | Status: DC
  Administered 2024-03-15 – 2024-03-16 (×2): 112.5 mg via ORAL
  Filled 2024-03-15 (×2): qty 1

## 2024-03-15 MED ORDER — GUAIFENESIN ER 600 MG PO TB12
600.0000 mg | ORAL_TABLET | Freq: Two times a day (BID) | ORAL | Status: DC
  Administered 2024-03-15 – 2024-03-16 (×3): 600 mg via ORAL
  Filled 2024-03-15 (×4): qty 1

## 2024-03-15 MED ORDER — VENLAFAXINE HCL ER 75 MG PO CP24: 75.0000 mg | ORAL_CAPSULE | Freq: Every day | ORAL | Status: DC

## 2024-03-15 MED ORDER — ACETAMINOPHEN 650 MG RE SUPP: 650.0000 mg | Freq: Four times a day (QID) | RECTAL | Status: DC | PRN

## 2024-03-15 MED ORDER — SODIUM CHLORIDE 0.9 % IV SOLN
100.0000 mg | Freq: Two times a day (BID) | INTRAVENOUS | Status: DC
  Administered 2024-03-15: 100 mg via INTRAVENOUS
  Filled 2024-03-15 (×2): qty 100

## 2024-03-15 MED ORDER — ALBUTEROL SULFATE (2.5 MG/3ML) 0.083% IN NEBU: 2.5000 mg | INHALATION_SOLUTION | RESPIRATORY_TRACT | Status: DC | PRN

## 2024-03-15 MED ORDER — MELATONIN 5 MG PO TABS
10.0000 mg | ORAL_TABLET | Freq: Every day | ORAL | Status: DC
  Administered 2024-03-15: 10 mg via ORAL
  Filled 2024-03-15: qty 2

## 2024-03-15 MED ORDER — OXYCODONE HCL 5 MG PO TABS
5.0000 mg | ORAL_TABLET | Freq: Three times a day (TID) | ORAL | Status: DC | PRN
  Administered 2024-03-15 – 2024-03-16 (×4): 5 mg via ORAL
  Filled 2024-03-15 (×4): qty 1

## 2024-03-15 MED ORDER — SODIUM CHLORIDE 0.9 % IV SOLN
2.0000 g | INTRAVENOUS | Status: DC
  Filled 2024-03-15: qty 20

## 2024-03-15 MED ORDER — LACTATED RINGERS IV SOLN
150.0000 mL/h | INTRAVENOUS | Status: DC
Start: 2024-03-15 — End: 2024-03-15
  Administered 2024-03-15: 150 mL/h via INTRAVENOUS

## 2024-03-15 MED ORDER — ENOXAPARIN SODIUM 60 MG/0.6ML IJ SOSY
0.5000 mg/kg | PREFILLED_SYRINGE | INTRAMUSCULAR | Status: DC
  Administered 2024-03-15 – 2024-03-16 (×2): 57.5 mg via SUBCUTANEOUS
  Filled 2024-03-15 (×2): qty 0.6

## 2024-03-15 MED ORDER — SODIUM CHLORIDE 0.9 % IV SOLN: INTRAVENOUS | Status: AC

## 2024-03-15 MED ORDER — INSULIN ASPART 100 UNIT/ML IJ SOLN
0.0000 [IU] | Freq: Three times a day (TID) | INTRAMUSCULAR | Status: DC
  Administered 2024-03-15: 8 [IU] via SUBCUTANEOUS
  Administered 2024-03-15 – 2024-03-16 (×2): 5 [IU] via SUBCUTANEOUS
  Administered 2024-03-16: 3 [IU] via SUBCUTANEOUS
  Administered 2024-03-16: 5 [IU] via SUBCUTANEOUS
  Filled 2024-03-15 (×5): qty 1

## 2024-03-15 MED ORDER — PREGABALIN 75 MG PO CAPS
200.0000 mg | ORAL_CAPSULE | Freq: Two times a day (BID) | ORAL | Status: DC
  Administered 2024-03-15 – 2024-03-16 (×3): 200 mg via ORAL
  Filled 2024-03-15: qty 4
  Filled 2024-03-15 (×2): qty 1

## 2024-03-15 MED ORDER — LURASIDONE HCL 20 MG PO TABS
20.0000 mg | ORAL_TABLET | Freq: Every day | ORAL | Status: DC
  Administered 2024-03-16: 20 mg via ORAL
  Filled 2024-03-15 (×4): qty 1

## 2024-03-15 MED ORDER — VENLAFAXINE HCL ER 37.5 MG PO CP24: 37.5000 mg | ORAL_CAPSULE | Freq: Every morning | ORAL | Status: DC

## 2024-03-15 MED ORDER — TACROLIMUS 1 MG PO CAPS
1.0000 mg | ORAL_CAPSULE | Freq: Every morning | ORAL | Status: DC
  Administered 2024-03-15 – 2024-03-16 (×2): 1 mg via ORAL
  Filled 2024-03-15 (×2): qty 1

## 2024-03-15 MED ORDER — INSULIN ASPART 100 UNIT/ML IJ SOLN
0.0000 [IU] | Freq: Every day | INTRAMUSCULAR | Status: DC
  Administered 2024-03-15: 4 [IU] via SUBCUTANEOUS
  Filled 2024-03-15: qty 1

## 2024-03-15 MED ORDER — MIRTAZAPINE 15 MG PO TABS
15.0000 mg | ORAL_TABLET | Freq: Every day | ORAL | Status: DC
  Administered 2024-03-15: 15 mg via ORAL
  Filled 2024-03-15: qty 1

## 2024-03-15 MED ORDER — ROSUVASTATIN CALCIUM 10 MG PO TABS
10.0000 mg | ORAL_TABLET | Freq: Every day | ORAL | Status: DC
  Administered 2024-03-15 – 2024-03-16 (×2): 10 mg via ORAL
  Filled 2024-03-15 (×3): qty 1

## 2024-03-15 MED ORDER — OXYCODONE HCL 5 MG PO TABS
5.0000 mg | ORAL_TABLET | ORAL | Status: DC | PRN
  Administered 2024-03-15: 5 mg via ORAL
  Filled 2024-03-15: qty 1

## 2024-03-15 NOTE — ED Notes (Signed)
 This RN standby assist pt to the toilet - pt had steady and even gait.

## 2024-03-15 NOTE — ED Notes (Signed)
 CCMD called to place pt on cardiac monitor

## 2024-03-15 NOTE — Hospital Course (Addendum)
 Alison [name]

## 2024-03-15 NOTE — ED Notes (Signed)
 Lab called for blood collection

## 2024-03-15 NOTE — H&P (Signed)
 History and Physical    [name] BJY:[id number] DOB: [birth year] DOA: 03/14/2024 DOS: the patient was seen and examined on 03/15/2024 PCP: Quinton Buckler, FNP  Patient coming from: Home  Chief Complaint:  Chief Complaint  Patient presents with   Fever    HPI: Alison [name] is a 48 y.o. female with medical history significant for morbid obesity, lymphoma bilateral parotid gland s/p Rituxan  in 2020, diffuse large B-cell lymphoma,  lupus s/p liver transplant, currently on tacrolimus , neuropathy, major depressive disorder, post hepatic neuralgia, sleep apnea, who presents to the emergency department for chief concerns of fever and cough. In the ED Tmax of 103.4 with pulse 98, BP 142/81, O2 sat 90% on room air, placed on O2 at 2 L.  Briefly tachypneic to 27.  Labs notable for normal WBC of 5.3 and lactic acid 1.4 and negative respiratory viral panel.  Creatinine at baseline of 1.11, blood glucose 270, urinalysis unremarkable.  EKG showing sinus rhythm at 83 CTA PE protocol negative for PE but showing early asymmetric edema infection CT abdomen and pelvis with contrast showing liver nodularity suggestive of cirrhosis with distal esophageal varices and mild splenomegaly but no acute findings  Patient treated with Rocephin  and doxycycline  Admission requested     Review of Systems: As mentioned in the history of present illness. All other systems reviewed and are negative.  Past Medical History:  Diagnosis Date   Abnormal uterine bleeding    Allergy    Anxiety    Arthritis    Bipolar disorder (manic depression) (HCC)    Chronic kidney failure    Chronic renal disease, stage III (HCC)    Diabetes mellitus without complication (HCC)    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HCC) 2015   Right axillary lymph node resected and chemo tx's.   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 02/2023 Echo: EF 55-60%, no rwma, nl RV size/fxn. Mildly dil LA.   Personality disorder (HCC)    Pineal gland cyst    Post herpetic neuralgia    PTSD (post-traumatic stress disorder)    Renal disorder    S/P liver transplant (HCC) 1993   Sleep apnea    has not recieved cpap yet-other one was recalled   Past Surgical History:  Procedure Laterality Date   BONE MARROW BIOPSY  01/13/2015   BREAST BIOPSY Right 12/2014   US  Axilla Bx, Positive for Lymphoma   BREAST BIOPSY Right 2011   benign   BREAST BIOPSY Right 2023   Stereo bx, X-clip, Fat Necrosis   BREAST BIOPSY Right 01/20/2024   US  RT BREAST BX W LOC DEV 1ST LESION IMG BX SPEC US  GUIDE 01/20/2024 [facility]   BREAST SURGERY Right 2016   Lymphoma, lymph node removal. pt then got a seroma and had to do more surgery   CHOLECYSTECTOMY     COLONOSCOPY     COLONOSCOPY WITH PROPOFOL   N/A 02/28/2022   Procedure: COLONOSCOPY WITH PROPOFOL ;  Surgeon: Marnee Sink, MD;  Location: [facility] ENDOSCOPY;  Service: Endoscopy;  Laterality: N/A;   ESOPHAGOGASTRODUODENOSCOPY     ESOPHAGOGASTRODUODENOSCOPY (EGD) WITH PROPOFOL  N/A 01/09/2021   Procedure: ESOPHAGOGASTRODUODENOSCOPY (EGD) WITH PROPOFOL ;  Surgeon: Marnee Sink, MD;  Location: [facility] ENDOSCOPY;  Service: Endoscopy;  Laterality: N/A;   HERNIA REPAIR     x4-all from liver transplant   HYSTEROSCOPY WITH D & C N/A 11/13/2021   Procedure: DILATATION AND CURETTAGE /HYSTEROSCOPY;  Surgeon: Heron Lord, MD;  Location: [facility] ORS;  Service: Gynecology;  Laterality: N/A;   infusaport     LIVER TRANSPLANT  12/17/1991   LUMBAR PUNCTURE     PORT A CATH INJECTION ([facility]  HX)     RENAL BIOPSY     tumor removal  2015   cancer right side   Social History:  reports that she has never smoked. She has been exposed to tobacco smoke. She has never used smokeless tobacco. She reports that she does not currently use alcohol. She reports that she does not currently use drugs.  Allergies  Allergen Reactions   Penicillin G Itching, Rash, Anaphylaxis and Palpitations   Lamictal  [Lamotrigine ] Rash   Tape    Carbamazepine Other (See Comments)    Medication interaction-prograf     Hydrocodone-Acetaminophen  Itching   Naproxen Itching   Penicillins    Vancomycin  Rash    Macular/blotchy rash at infusion site    Family History  Problem Relation Age of Onset   Heart disease Mother    Hypertension Mother    Cancer - Other Mother    Bipolar disorder Mother    Heart disease Father    Hypertension Father    Diabetes Father    Parkinson's disease Maternal Grandmother    Cancer Maternal Aunt    Cancer Maternal Uncle    Cancer Maternal Grandfather    Lupus Paternal Grandmother    Hypertension Brother     Prior to Admission medications   Medication Sig Start Date End Date Taking? Authorizing Provider  Accu-Chek Softclix Lancets lancets 2 (two) times daily. 06/01/23   [provider]  albuterol  (VENTOLIN  HFA) 108 (90 Base) MCG/ACT inhaler Inhale 2 puffs into the lungs every 6 (six) hours as needed for wheezing or shortness of breath. 09/08/23   Pender, Julie F, FNP  baclofen  (LIORESAL ) 10 MG tablet Take 1 tablet by mouth 2 (two) times daily. 08/15/21   [provider]  benzonatate  (TESSALON ) 100 MG capsule Take 2 capsules (200 mg total) by mouth 2 (two) times daily as needed for cough. 09/08/23   Quinton Buckler, FNP  benztropine  (COGENTIN ) 1 MG tablet TAKE 1 TABLET BY MOUTH ONCE DAILY AS NEEDED FOR TREMORS 08/01/23   [name], [name], MD  carvedilol (COREG) 3.125 MG tablet  09/26/23   [provider]  Cholecalciferol  (VITAMIN D ) 125 MCG (5000  UT) CAPS Take 5,000 Units by mouth daily.    [provider]  Cranberry-Vitamin C-Probiotic (AZO CRANBERRY PO) Take by mouth.    [provider]  ezetimibe  (ZETIA ) 10 MG tablet Take 10 mg by mouth every morning. 04/09/21   [provider]  fluticasone  (FLONASE ) 50 MCG/ACT nasal spray Place into the nose. 10/30/21   [provider]  glipiZIDE  (GLUCOTROL  XL) 10 MG 24 hr tablet Take 1 tablet (10 mg total) by mouth in the morning and at bedtime. 11/17/23   Motwani, Komal, MD  ibuprofen  (ADVIL ) 400 MG tablet Take 1 tablet (400  mg total) by mouth every 6 (six) hours as needed. 03/11/23   Aisha Hove, MD  Insulin  Glargine (BASAGLAR  KWIKPEN) 100 UNIT/ML Inject 40 Units into the skin at bedtime. 03/03/23   [provider]  Insulin  Pen Needle 32G X 6 MM MISC 1 each by Does not apply route as needed. 05/22/23   Quinton Buckler, FNP  lisinopril  (ZESTRIL ) 20 MG tablet TAKE 1 TABLET BY MOUTH ONCE DAILY *PATIENT WILL NEED TO SCHEDULE AN APPOINTMENT FOR ADDITIONAL REFILLS* 02/27/24   Quinton Buckler, FNP  lurasidone  (LATUDA ) 20 MG TABS tablet TAKE 1 TABLET BY MOUTH ONCE DAILY WITH SUPPER *REFILL REQUEST* 06/04/23   [name], [name], MD  magnesium  oxide (MAG-OX) 400 MG tablet Take 1,000 mg by mouth 2 (two) times daily.    [provider]  Melatonin 10 MG TABS Take 10 mg by mouth at bedtime.    [provider]  metFORMIN  (GLUCOPHAGE ) 1000 MG tablet TAKE 1 TABLET BY MOUTH TWICE DAILY WITH MEALS *PATIENT WILL NEED TO SCHEDULE APPOINTMENT FOR ADDITIONAL REFILLS* 02/27/24   Quinton Buckler, FNP  mirabegron  ER (MYRBETRIQ ) 50 MG TB24 tablet Take 1 tablet (50 mg total) by mouth daily. 01/22/23   Quinton Buckler, FNP  mirtazapine  (REMERON ) 15 MG tablet TAKE 1 TABLET BY MOUTH EVERY DAY AT BEDTIME *REFILL REQUEST* 06/04/23   [name], [name], MD  naloxone [facility]) nasal spray 4 mg/0.1 mL Place 0.4 mg into the nose once. 09/05/21   [provider]   nitrofurantoin , macrocrystal-monohydrate, (MACROBID ) 100 MG capsule Take 1 capsule (100 mg total) by mouth 2 (two) times daily. 02/25/24   Pender, Julie F, FNP  NOVOLOG  FLEXPEN 100 UNIT/ML FlexPen Inject 0-30 Units into the skin 3 (three) times daily with meals. Sliding scale 01/21/23   [provider]  ondansetron  (ZOFRAN ) 4 MG tablet Take 1 tablet (4 mg total) by mouth every 6 (six) hours as needed for nausea. 10/16/23   Amin, Sumayya, MD  oxyCODONE  (OXY IR/ROXICODONE ) 5 MG immediate release tablet Take 5 mg by mouth 3 (three) times daily as needed.    [provider]  PRECISION QID TEST test strip  08/06/19   [provider]  predniSONE  (STERAPRED UNI-PAK 21 TAB) 10 MG (21) TBPK tablet Per packaging instructions 02/26/24   Marylynn Soho, MD  pregabalin  (LYRICA ) 200 MG capsule Take 1 capsule (200 mg total) by mouth 2 (two) times daily. 12/03/21   Goodman, Graydon, MD  rosuvastatin  (CRESTOR ) 10 MG tablet Take 1 tablet (10 mg total) by mouth daily. 08/21/23   Constancia Delton, MD  Semaglutide ,0.25 or 0.5MG /DOS, 2 MG/3ML SOPN Inject 0.25 mg into the skin once a week. 11/17/23   Motwani, Komal, MD  tacrolimus  (PROGRAF ) 1 MG capsule Take 1 capsule by mouth every morning. 02/20/23   [provider]  UBRELVY  50 MG TABS Take 1 tablet by mouth daily as needed. 01/19/22   [provider]  venlafaxine  XR (EFFEXOR -XR) 37.5 MG 24 hr capsule Take 37.5 mg by mouth every morning. 12/01/23   [provider]  venlafaxine  XR (EFFEXOR -XR) 75 MG 24 hr capsule Take 1 capsule (75 mg total) by mouth daily with breakfast. Take along with 37.5 mg daily 09/09/23   [name], [name], MD  Xylitol (XYLIMELTS) 550 MG DISK Take by mouth. 07/20/21   [provider]    Physical Exam: Vitals:   03/14/24 2221 03/15/24 0030 03/15/24 0224 03/15/24 0323  BP:   115/72   Pulse:  76 69 64  Resp:  Aaron Aas)  27 19 (!) 29  Temp: (!) 101.7 F (38.7 C) 99.9 F (37.7 C)  99.3 F (37.4  C)  TempSrc: Oral Oral  Oral  SpO2:  95% 94% 99%  Weight:      Height:       Physical Exam Vitals and nursing note reviewed.  Constitutional:      General: She is not in acute distress. HENT:     Head: Normocephalic and atraumatic.  Cardiovascular:     Rate and Rhythm: Normal rate and regular rhythm.     Heart sounds: Normal heart sounds.  Pulmonary:     Effort: Tachypnea present.     Breath sounds: Normal breath sounds.  Abdominal:     Palpations: Abdomen is soft.     Tenderness: There is no abdominal tenderness.  Neurological:     Mental Status: Mental status is at baseline.     Labs on Admission: I have personally reviewed following labs and imaging studies  CBC: Recent Labs  Lab 03/14/24 2145  WBC 5.3  NEUTROABS 2.7  HGB 12.2  HCT 38.2  MCV 90.7  PLT 115*   Basic Metabolic Panel: Recent Labs  Lab 03/14/24 2145  NA 134*  K 4.4  CL 103  CO2 19*  GLUCOSE 270*  BUN 16  CREATININE 1.11*  CALCIUM  8.8*   GFR: Estimated Creatinine Clearance: 77.4 mL/min (A) (by C-G formula based on SCr of 1.11 mg/dL (H)). Liver Function Tests: Recent Labs  Lab 03/14/24 2145  AST 30  ALT 16  ALKPHOS 61  BILITOT 0.9  PROT 7.0  ALBUMIN 3.6   No results for input(s): "LIPASE", "AMYLASE" in the last 168 hours. No results for input(s): "AMMONIA" in the last 168 hours. Coagulation Profile: No results for input(s): "INR", "PROTIME" in the last 168 hours. Cardiac Enzymes: No results for input(s): "CKTOTAL", "CKMB", "CKMBINDEX", "TROPONINI" in the last 168 hours. BNP (last 3 results) No results for input(s): "PROBNP" in the last 8760 hours. HbA1C: No results for input(s): "HGBA1C" in the last 72 hours. CBG: No results for input(s): "GLUCAP" in the last 168 hours. Lipid Profile: No results for input(s): "CHOL", "HDL", "LDLCALC", "TRIG", "CHOLHDL", "LDLDIRECT" in the last 72 hours. Thyroid  Function Tests: No results for input(s): "TSH", "T4TOTAL", "FREET4", "T3FREE",  "THYROIDAB" in the last 72 hours. Anemia Panel: No results for input(s): "VITAMINB12", "FOLATE", "FERRITIN", "TIBC", "IRON", "RETICCTPCT" in the last 72 hours. Urine analysis:    Component Value Date/Time   COLORURINE YELLOW (A) 03/15/2024 0022   APPEARANCEUR CLEAR (A) 03/15/2024 0022   APPEARANCEUR Clear 02/12/2022 1125   LABSPEC 1.033 (H) 03/15/2024 0022   PHURINE 6.0 03/15/2024 0022   GLUCOSEU 50 (A) 03/15/2024 0022   HGBUR NEGATIVE 03/15/2024 0022   BILIRUBINUR NEGATIVE 03/15/2024 0022   BILIRUBINUR neg 02/25/2024 1440   BILIRUBINUR Negative 02/12/2022 1125   KETONESUR NEGATIVE 03/15/2024 0022   PROTEINUR 100 (A) 03/15/2024 0022   UROBILINOGEN 0.2 02/25/2024 1440   NITRITE NEGATIVE 03/15/2024 0022   LEUKOCYTESUR NEGATIVE 03/15/2024 0022    Radiological Exams on Admission: CT ABDOMEN PELVIS W CONTRAST Result Date: 03/14/2024 CLINICAL DATA:  Abdominal pain, acute, nonlocalized right flank and abd pain EXAM: CT ABDOMEN AND PELVIS WITH CONTRAST TECHNIQUE: Multidetector CT imaging of the abdomen and pelvis was performed using the standard protocol following bolus administration of intravenous contrast. RADIATION DOSE REDUCTION: This exam was performed according to the departmental dose-optimization program which includes automated exposure control, adjustment of the mA and/or kV according to patient size and/or  use of iterative reconstruction technique. CONTRAST:  OMNIPAQUE  IOHEXOL  350 MG/ML SOLN COMPARISON:  03/06/2023 FINDINGS: Lower chest: See chest CT report Hepatobiliary: Subtle nodular contours of the liver surface. Recommend clinical correlation for possible early cirrhosis. No focal hepatic abnormality. Prior cholecystectomy. Pancreas: No focal abnormality or ductal dilatation. Spleen: Splenomegaly with a craniocaudal length of 14 cm. No focal abnormality. Adrenals/Urinary Tract: Atrophy of the left kidney. Areas of scarring in the kidneys bilaterally. Small bilateral renal cysts  are stable. No follow-up imaging recommended. No stones or hydronephrosis. Adrenal glands and urinary bladder unremarkable. Stomach/Bowel: Stomach, large and small bowel grossly unremarkable. Vascular/Lymphatic: No evidence of aneurysm or adenopathy. Scattered aortic atherosclerosis. Prominent vessels around the distal esophagus compatible with distal esophageal varices. Reproductive: Uterus and adnexa unremarkable.  No mass. Other: No free fluid or free air. Musculoskeletal: No acute bony abnormality. IMPRESSION: Subtle nodularity to the liver surface suggests cirrhosis. There are distal esophageal varices and mild splenomegaly. No acute findings in the abdomen or pelvis. Electronically Signed   By: Janeece Mechanic M.D.   On: 03/14/2024 22:41   CT Angio Chest PE W and/or Wo Contrast Result Date: 03/14/2024 CLINICAL DATA:  Pulmonary embolism (PE) suspected, high prob. Shortness of breath, fever, cough EXAM: CT ANGIOGRAPHY CHEST WITH CONTRAST TECHNIQUE: Multidetector CT imaging of the chest was performed using the standard protocol during bolus administration of intravenous contrast. Multiplanar CT image reconstructions and MIPs were obtained to evaluate the vascular anatomy. RADIATION DOSE REDUCTION: This exam was performed according to the departmental dose-optimization program which includes automated exposure control, adjustment of the mA and/or kV according to patient size and/or use of iterative reconstruction technique. CONTRAST:  OMNIPAQUE  IOHEXOL  350 MG/ML SOLN COMPARISON:  02/23/2024 FINDINGS: Cardiovascular: No filling defects in the pulmonary arteries to suggest pulmonary emboli. Heart borderline in size. Scattered coronary artery and aortic calcifications. Mediastinum/Nodes: No mediastinal, hilar, or axillary adenopathy. Trachea and esophagus are unremarkable. Thyroid  unremarkable. Lungs/Pleura: Ground-glass airspace opacities in the left upper lobe and left lower lobe as well as right lower lobe.  Findings could reflect early asymmetric edema or infection. No effusions. Upper Abdomen: See abdominal CT report today. Musculoskeletal: Chest wall soft tissues are unremarkable. No acute bony abnormality. Review of the MIP images confirms the above findings. IMPRESSION: No evidence of pulmonary embolus. Borderline cardiomegaly.  Scattered coronary artery calcifications. Scattered ground-glass opacities in the lungs bilaterally, left slightly greater than right. This could reflect early asymmetric edema or infection. Aortic Atherosclerosis (ICD10-I70.0). Electronically Signed   By: Janeece Mechanic M.D.   On: 03/14/2024 22:36   DG Chest Port 1 View Result Date: 03/14/2024 CLINICAL DATA:  Shortness of breath, fever, cough EXAM: PORTABLE CHEST 1 VIEW COMPARISON:  02/26/2024 FINDINGS: Left Port-A-Cath remains in place, unchanged. Heart is borderline in size. Mediastinal contours are within normal limits. Mild peribronchial thickening. No confluent airspace opacities, effusions or edema. No acute bony abnormality. IMPRESSION: Borderline heart size. Mild bronchitic changes. Electronically Signed   By: Janeece Mechanic M.D.   On: 03/14/2024 22:10   Data Reviewed for HPI: Relevant notes from primary care and specialist visits, past discharge summaries as available in EHR, including Care Everywhere. Prior diagnostic testing as pertinent to current admission diagnoses Updated medications and problem lists for reconciliation ED course, including vitals, labs, imaging, treatment and response to treatment Triage notes, nursing and pharmacy notes and ED provider's notes Notable results as noted above in HPI      Assessment and Plan:   Sepsis secondary to  pneumonia At high risk of infection due to chronic immunosuppressive therapy Patient met sepsis criteria with fever, mild tachycardia and tachypnea,  Rocephin  and azithromycin  Sepsis fluids   Hyperglycemia secondary to insulin  dependent type 2 diabetes mellitus  (HCC) Insulin  SSI with at bedtime coverage ordered Goal inpatient blood glucose levels 140-180   Insomnia Continue Home melatonin 10 mg nightly    Opioid dependence with opioid-induced disorder (HCC) Continue home meds   Obesity (BMI 35.0-39.9 without comorbidity) This complicates overall care and prognosis.    SLE S/P liver transplant (HCC) Lymphoma bilateral parotid gland s/p Rituxan  in 2020,  History of diffuse large B-cell lymphoma,  Continue tacrolimus    OSA on CPAP CPAP nightly ordered   Hyperlipidemia Continue home rosuvastatin  at ezetimibe  10 mg   DVT prophylaxis: Lovenox   Consults: none  Advance Care Planning:   Code Status: Prior   Family Communication: none  Disposition Plan: Back to previous home environment  Severity of Illness: The appropriate patient status for this patient is INPATIENT. Inpatient status is judged to be reasonable and necessary in order to provide the required intensity of service to ensure the patient's safety. The patient's presenting symptoms, physical exam findings, and initial radiographic and laboratory data in the context of their chronic comorbidities is felt to place them at high risk for further clinical deterioration. Furthermore, it is not anticipated that the patient will be medically stable for discharge from the hospital within 2 midnights of admission.   * I certify that at the point of admission it is my clinical judgment that the patient will require inpatient hospital care spanning beyond 2 midnights from the point of admission due to high intensity of service, high risk for further deterioration and high frequency of surveillance required.*  Author: Lanetta Pion, MD 03/15/2024 3:26 AM  For on call review [email or web address].

## 2024-03-15 NOTE — Progress Notes (Signed)
 PROGRESS NOTE    Alison [name]  ZOX:[id number] DOB: 1976/08/01 DOA: 03/14/2024 PCP: Quinton Buckler, FNP     Brief Narrative:   From admission h and p  Alison [name] is a 48 y.o. female with medical history significant for morbid obesity, lymphoma bilateral parotid gland s/p Rituxan  in 2020, diffuse large B-cell lymphoma,  lupus s/p liver transplant, currently on tacrolimus , neuropathy, major depressive disorder, post hepatic neuralgia, sleep apnea, who presents to the emergency department for chief concerns of fever and cough. In the ED Tmax of 103.4 with pulse 98, BP 142/81, O2 sat 90% on room air, placed on O2 at 2 L.  Briefly tachypneic to 27.  Labs notable for normal WBC of 5.3 and lactic acid 1.4 and negative respiratory viral panel.  Creatinine at baseline of 1.11, blood glucose 270, urinalysis unremarkable.  EKG showing sinus rhythm at 83 CTA PE protocol negative for PE but showing early asymmetric edema infection CT abdomen and pelvis with contrast showing liver nodularity suggestive of cirrhosis with distal esophageal varices and mild splenomegaly but no acute findings  Assessment & Plan:   Principal Problem:   Sepsis due to pneumonia [facility]) Active Problems:   Systemic lupus erythematosus (SLE) in adult [facility])   Essential hypertension   Trigeminal neuralgia   OSA on CPAP   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HCC)   Morbid obesity (HCC)   S/P liver transplant (HCC)   Opioid dependence with opioid-induced disorder (HCC)   Bipolar disorder, in full remission, most recent episode mixed (HCC)   Insulin  dependent type 2 diabetes mellitus (HCC)  # CAP 3 days cough, 2 days fever, scattered ground-glass opacities in lugs. Covid/flu/rsv neg - continue ceftriaxone /doxy - monitor blood cultures - expand w/u with urine antigens, rvp, sputum for culture  # Sepsis By fever, lactic acidosis. Improving - continue fluids  # Acute hypoxic respiratory failure 2/2 pneumonia.  Stable on 2 liters. CTA no PE, effusion - [place] O2, wean as able  # Right flank pain No tenderness, CT negative, recently treated for uti with resolution of dysuria, ua does not appear infected. No spinous process tenderness - monitor  # History liver transplant - continue home tacrolimus  as right now infection does not appear to be severe, if clinically worsens may need to consider holding  # T2DM Hyperglycemic - continue semglee  40  - add SSI moderate  # Morbid obesity Noted  # OUD - home oxycodone , pregabalin   # Hx lymphoma S/p treatment, now monitored - outpt oncology f/u  # OSA - cpap at bedtime  # Bipolar - home venlafaxine , mirtazapine , lurasidone      DVT prophylaxis: lovenox  Code Status: full Family Communication: none at bedside  Level of care: Progressive Status is: Inpatient Remains inpatient appropriate because: severity of illness    Consultants:  none  Procedures: none  Antimicrobials:  See above    Subjective: Reports malaise  Objective: Vitals:   03/15/24 0224 03/15/24 0323 03/15/24 0600 03/15/24 0730  BP: 115/72  109/78 117/79  Pulse: 69 64 79 86  Resp: 19 (!) 29 (!) 25 (!) 25  Temp:  99.3 F (37.4 C)    TempSrc:  Oral    SpO2: 94% 99% 98% 95%  Weight:      Height:       No intake or output data in the 24 hours ending 03/15/24 0921 Filed Weights   03/14/24 2117  Weight: 113.4 kg    Examination:  General exam: Appears calm  and comfortable  Respiratory system: scattered rales Cardiovascular system: S1 & S2 heard, RRR.   Gastrointestinal system: Abdomen is obese, soft and nontender. No organomegaly or masses felt. Normal bowel sounds heard. No flank tenderness Central nervous system: Alert and oriented. No focal neurological deficits. Extremities: Symmetric 5 x 5 power. Skin: No rashes, lesions or ulcers Psychiatry: Judgement and insight appear normal. Mood & affect appropriate.     Data Reviewed: I have personally  reviewed following labs and imaging studies  CBC: Recent Labs  Lab 03/14/24 2145  WBC 5.3  NEUTROABS 2.7  HGB 12.2  HCT 38.2  MCV 90.7  PLT 115*   Basic Metabolic Panel: Recent Labs  Lab 03/14/24 2145  NA 134*  K 4.4  CL 103  CO2 19*  GLUCOSE 270*  BUN 16  CREATININE 1.11*  CALCIUM  8.8*   GFR: Estimated Creatinine Clearance: 77.4 mL/min (A) (by C-G formula based on SCr of 1.11 mg/dL (H)). Liver Function Tests: Recent Labs  Lab 03/14/24 2145  AST 30  ALT 16  ALKPHOS 61  BILITOT 0.9  PROT 7.0  ALBUMIN 3.6   No results for input(s): "LIPASE", "AMYLASE" in the last 168 hours. No results for input(s): "AMMONIA" in the last 168 hours. Coagulation Profile: Recent Labs  Lab 03/15/24 0619  INR 1.0   Cardiac Enzymes: No results for input(s): "CKTOTAL", "CKMB", "CKMBINDEX", "TROPONINI" in the last 168 hours. BNP (last 3 results) No results for input(s): "PROBNP" in the last 8760 hours. HbA1C: No results for input(s): "HGBA1C" in the last 72 hours. CBG: No results for input(s): "GLUCAP" in the last 168 hours. Lipid Profile: No results for input(s): "CHOL", "HDL", "LDLCALC", "TRIG", "CHOLHDL", "LDLDIRECT" in the last 72 hours. Thyroid  Function Tests: No results for input(s): "TSH", "T4TOTAL", "FREET4", "T3FREE", "THYROIDAB" in the last 72 hours. Anemia Panel: No results for input(s): "VITAMINB12", "FOLATE", "FERRITIN", "TIBC", "IRON", "RETICCTPCT" in the last 72 hours. Urine analysis:    Component Value Date/Time   COLORURINE YELLOW (A) 03/15/2024 0022   APPEARANCEUR CLEAR (A) 03/15/2024 0022   APPEARANCEUR Clear 02/12/2022 1125   LABSPEC 1.033 (H) 03/15/2024 0022   PHURINE 6.0 03/15/2024 0022   GLUCOSEU 50 (A) 03/15/2024 0022   HGBUR NEGATIVE 03/15/2024 0022   BILIRUBINUR NEGATIVE 03/15/2024 0022   BILIRUBINUR neg 02/25/2024 1440   BILIRUBINUR Negative 02/12/2022 1125   KETONESUR NEGATIVE 03/15/2024 0022   PROTEINUR 100 (A) 03/15/2024 0022    UROBILINOGEN 0.2 02/25/2024 1440   NITRITE NEGATIVE 03/15/2024 0022   LEUKOCYTESUR NEGATIVE 03/15/2024 0022   Sepsis Labs: @LABRCNTIP (procalcitonin:4,lacticidven:4)  ) Recent Results (from the past 240 hours)  Blood Culture (routine x 2)     Status: None (Preliminary result)   Collection Time: 03/14/24  9:43 PM   Specimen: BLOOD  Result Value Ref Range Status   Specimen Description BLOOD BLOOD LEFT FOREARM  Final   Special Requests   Final    BOTTLES DRAWN AEROBIC AND ANAEROBIC Blood Culture adequate volume   Culture   Final    NO GROWTH < 12 HOURS Performed at [facility], [address], [place], [place] [address]    Report Status PENDING  Incomplete  Blood Culture (routine x 2)     Status: None (Preliminary result)   Collection Time: 03/14/24  9:43 PM   Specimen: BLOOD  Result Value Ref Range Status   Specimen Description BLOOD BLOOD RIGHT ARM  Final   Special Requests   Final    BOTTLES DRAWN AEROBIC AND ANAEROBIC Blood  Culture adequate volume   Culture   Final    NO GROWTH < 12 HOURS Performed at [facility], [address] [place], [place] [address]    Report Status PENDING  Incomplete  Resp panel by RT-PCR (RSV, Flu A&B, Covid) Anterior Nasal Swab     Status: None   Collection Time: 03/14/24 11:48 PM   Specimen: Anterior Nasal Swab  Result Value Ref Range Status   SARS Coronavirus 2 by RT PCR NEGATIVE NEGATIVE Final    Comment: (NOTE) SARS-CoV-2 target nucleic acids are NOT DETECTED.  The SARS-CoV-2 RNA is generally detectable in upper respiratory specimens during the acute phase of infection. The lowest concentration of SARS-CoV-2 viral copies this assay can detect is 138 copies/mL. A negative result does not preclude SARS-Cov-2 infection and should not be used as the sole basis for treatment or other patient management decisions. A negative result may occur with  improper specimen collection/handling, submission of specimen other than  nasopharyngeal swab, presence of viral mutation(s) within the areas targeted by this assay, and inadequate number of viral copies(<138 copies/mL). A negative result must be combined with clinical observations, patient history, and epidemiological information. The expected result is Negative.  Fact Sheet for Patients:  BloggerCourse.com  Fact Sheet for Healthcare Providers:  SeriousBroker.it  This test is no t yet approved or cleared by the [place]  FDA and  has been authorized for detection and/or diagnosis of SARS-CoV-2 by FDA under an Emergency Use Authorization (EUA). This EUA will remain  in effect (meaning this test can be used) for the duration of the COVID-19 declaration under Section 564(b)(1) of the Act, 21 U.S.C.section 360bbb-3(b)(1), unless the authorization is terminated  or revoked sooner.       Influenza A by PCR NEGATIVE NEGATIVE Final   Influenza B by PCR NEGATIVE NEGATIVE Final    Comment: (NOTE) The Xpert Xpress SARS-CoV-2/FLU/RSV plus assay is intended as an aid in the diagnosis of influenza from Nasopharyngeal swab specimens and should not be used as a sole basis for treatment. Nasal washings and aspirates are unacceptable for Xpert Xpress SARS-CoV-2/FLU/RSV testing.  Fact Sheet for Patients: BloggerCourse.com  Fact Sheet for Healthcare Providers: SeriousBroker.it  This test is not yet approved or cleared by the [place]  FDA and has been authorized for detection and/or diagnosis of SARS-CoV-2 by FDA under an Emergency Use Authorization (EUA). This EUA will remain in effect (meaning this test can be used) for the duration of the COVID-19 declaration under Section 564(b)(1) of the Act, 21 U.S.C. section 360bbb-3(b)(1), unless the authorization is terminated or revoked.     Resp Syncytial Virus by PCR NEGATIVE NEGATIVE Final    Comment:  (NOTE) Fact Sheet for Patients: BloggerCourse.com  Fact Sheet for Healthcare Providers: SeriousBroker.it  This test is not yet approved or cleared by the [place]  FDA and has been authorized for detection and/or diagnosis of SARS-CoV-2 by FDA under an Emergency Use Authorization (EUA). This EUA will remain in effect (meaning this test can be used) for the duration of the COVID-19 declaration under Section 564(b)(1) of the Act, 21 U.S.C. section 360bbb-3(b)(1), unless the authorization is terminated or revoked.  Performed at [facility], [address] [place], [place] [address]          Radiology Studies: CT ABDOMEN PELVIS W CONTRAST Result Date: 03/14/2024 CLINICAL DATA:  Abdominal pain, acute, nonlocalized right flank and abd pain EXAM: CT ABDOMEN AND PELVIS WITH CONTRAST TECHNIQUE: Multidetector CT imaging of the abdomen  and pelvis was performed using the standard protocol following bolus administration of intravenous contrast. RADIATION DOSE REDUCTION: This exam was performed according to the departmental dose-optimization program which includes automated exposure control, adjustment of the mA and/or kV according to patient size and/or use of iterative reconstruction technique. CONTRAST:  OMNIPAQUE  IOHEXOL  350 MG/ML SOLN COMPARISON:  03/06/2023 FINDINGS: Lower chest: See chest CT report Hepatobiliary: Subtle nodular contours of the liver surface. Recommend clinical correlation for possible early cirrhosis. No focal hepatic abnormality. Prior cholecystectomy. Pancreas: No focal abnormality or ductal dilatation. Spleen: Splenomegaly with a craniocaudal length of 14 cm. No focal abnormality. Adrenals/Urinary Tract: Atrophy of the left kidney. Areas of scarring in the kidneys bilaterally. Small bilateral renal cysts are stable. No follow-up imaging recommended. No stones or hydronephrosis. Adrenal glands and urinary  bladder unremarkable. Stomach/Bowel: Stomach, large and small bowel grossly unremarkable. Vascular/Lymphatic: No evidence of aneurysm or adenopathy. Scattered aortic atherosclerosis. Prominent vessels around the distal esophagus compatible with distal esophageal varices. Reproductive: Uterus and adnexa unremarkable.  No mass. Other: No free fluid or free air. Musculoskeletal: No acute bony abnormality. IMPRESSION: Subtle nodularity to the liver surface suggests cirrhosis. There are distal esophageal varices and mild splenomegaly. No acute findings in the abdomen or pelvis. Electronically Signed   By: Janeece Mechanic M.D.   On: 03/14/2024 22:41   CT Angio Chest PE W and/or Wo Contrast Result Date: 03/14/2024 CLINICAL DATA:  Pulmonary embolism (PE) suspected, high prob. Shortness of breath, fever, cough EXAM: CT ANGIOGRAPHY CHEST WITH CONTRAST TECHNIQUE: Multidetector CT imaging of the chest was performed using the standard protocol during bolus administration of intravenous contrast. Multiplanar CT image reconstructions and MIPs were obtained to evaluate the vascular anatomy. RADIATION DOSE REDUCTION: This exam was performed according to the departmental dose-optimization program which includes automated exposure control, adjustment of the mA and/or kV according to patient size and/or use of iterative reconstruction technique. CONTRAST:  OMNIPAQUE  IOHEXOL  350 MG/ML SOLN COMPARISON:  02/23/2024 FINDINGS: Cardiovascular: No filling defects in the pulmonary arteries to suggest pulmonary emboli. Heart borderline in size. Scattered coronary artery and aortic calcifications. Mediastinum/Nodes: No mediastinal, hilar, or axillary adenopathy. Trachea and esophagus are unremarkable. Thyroid  unremarkable. Lungs/Pleura: Ground-glass airspace opacities in the left upper lobe and left lower lobe as well as right lower lobe. Findings could reflect early asymmetric edema or infection. No effusions. Upper Abdomen: See abdominal  CT report today. Musculoskeletal: Chest wall soft tissues are unremarkable. No acute bony abnormality. Review of the MIP images confirms the above findings. IMPRESSION: No evidence of pulmonary embolus. Borderline cardiomegaly.  Scattered coronary artery calcifications. Scattered ground-glass opacities in the lungs bilaterally, left slightly greater than right. This could reflect early asymmetric edema or infection. Aortic Atherosclerosis (ICD10-I70.0). Electronically Signed   By: Janeece Mechanic M.D.   On: 03/14/2024 22:36   DG Chest Port 1 View Result Date: 03/14/2024 CLINICAL DATA:  Shortness of breath, fever, cough EXAM: PORTABLE CHEST 1 VIEW COMPARISON:  02/26/2024 FINDINGS: Left Port-A-Cath remains in place, unchanged. Heart is borderline in size. Mediastinal contours are within normal limits. Mild peribronchial thickening. No confluent airspace opacities, effusions or edema. No acute bony abnormality. IMPRESSION: Borderline heart size. Mild bronchitic changes. Electronically Signed   By: Janeece Mechanic M.D.   On: 03/14/2024 22:10        Scheduled Meds:  enoxaparin  (LOVENOX ) injection  0.5 mg/kg Subcutaneous Q24H   guaiFENesin  600 mg Oral BID   insulin  glargine-yfgn  40 Units Subcutaneous QHS   lurasidone   20 mg Oral QAC supper   melatonin  10 mg Oral QHS   mirtazapine   15 mg Oral QHS   pregabalin   200 mg Oral BID   rosuvastatin   10 mg Oral Daily   tacrolimus   1 mg Oral q morning   venlafaxine  XR  37.5 mg Oral q morning   [START ON 03/16/2024] venlafaxine  XR  75 mg Oral Q breakfast   Continuous Infusions:  cefTRIAXone  (ROCEPHIN )  IV     doxycycline  (VIBRAMYCIN ) IV     lactated ringers  150 mL/hr (03/15/24 0542)     LOS: 0 days     Raymonde Calico, MD [facility]   If 7PM-7AM, please contact night-coverage [email or web address] Password [email or web address] 03/15/2024, 9:21 AM

## 2024-03-16 DIAGNOSIS — J123 Human metapneumovirus pneumonia: Secondary | ICD-10-CM | POA: Diagnosis not present

## 2024-03-16 LAB — CBC
HCT: 32.9 % — ABNORMAL LOW (ref 36.0–46.0)
Hemoglobin: 11.1 g/dL — ABNORMAL LOW (ref 12.0–15.0)
MCH: 29.6 pg (ref 26.0–34.0)
MCHC: 33.7 g/dL (ref 30.0–36.0)
MCV: 87.7 fL (ref 80.0–100.0)
Platelets: 102 10*3/uL — ABNORMAL LOW (ref 150–400)
RBC: 3.75 MIL/uL — ABNORMAL LOW (ref 3.87–5.11)
RDW: 14.6 % (ref 11.5–15.5)
WBC: 4.8 10*3/uL (ref 4.0–10.5)
nRBC: 0 % (ref 0.0–0.2)

## 2024-03-16 LAB — BASIC METABOLIC PANEL WITH GFR
Anion gap: 6 (ref 5–15)
BUN: 16 mg/dL (ref 6–20)
CO2: 23 mmol/L (ref 22–32)
Calcium: 8 mg/dL — ABNORMAL LOW (ref 8.9–10.3)
Chloride: 107 mmol/L (ref 98–111)
Creatinine, Ser: 0.95 mg/dL (ref 0.44–1.00)
GFR, Estimated: >60 mL/min (ref >60)
Glucose, Bld: 173 mg/dL — ABNORMAL HIGH (ref 70–99)
Potassium: 4.1 mmol/L (ref 3.5–5.1)
Sodium: 136 mmol/L (ref 135–145)

## 2024-03-16 LAB — GLUCOSE, CAPILLARY
Glucose-Capillary: 200 mg/dL — ABNORMAL HIGH (ref 70–99)
Glucose-Capillary: 236 mg/dL — ABNORMAL HIGH (ref 70–99)
Glucose-Capillary: 216 mg/dL — ABNORMAL HIGH (ref 70–99)

## 2024-03-16 MED ORDER — CHLORHEXIDINE GLUCONATE CLOTH 2 % EX PADS
6.0000 | MEDICATED_PAD | Freq: Every day | CUTANEOUS | Status: DC
  Administered 2024-03-16: 6 via TOPICAL

## 2024-03-16 MED ORDER — SODIUM CHLORIDE 0.9% FLUSH: 10.0000 mL | INTRAVENOUS | Status: DC | PRN

## 2024-03-16 MED ORDER — SODIUM CHLORIDE 0.9% FLUSH
10.0000 mL | Freq: Two times a day (BID) | INTRAVENOUS | Status: DC
  Administered 2024-03-16: 10 mL

## 2024-03-16 MED ORDER — HEPARIN NA (PORK) LOCK FLSH PF 10 UNIT/ML IV SOLN
10.0000 [IU] | Freq: Once | INTRAVENOUS | Status: DC
  Filled 2024-03-16: qty 5

## 2024-03-16 NOTE — Discharge Summary (Signed)
 Alison [name] JYN:[id number] DOB: November 03, 1975 DOA: 03/14/2024  PCP: Alison Buckler, FNP  Admit date: 03/14/2024 Discharge date: 03/16/2024  Time spent: 35 minutes  Recommendations for Outpatient Follow-up:  Pcp f/u within 1 week, attention to BP then, consider resuming lisinopril      Discharge Diagnoses:  Principal Problem:   Human metapneumovirus pneumonia Active Problems:   Systemic lupus erythematosus (SLE) in adult [facility])   Essential hypertension   Trigeminal neuralgia   OSA on CPAP   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HCC)   Morbid obesity (HCC)   S/P liver transplant (HCC)   Opioid dependence with opioid-induced disorder (HCC)   Bipolar disorder, in full remission, most recent episode mixed (HCC)   Insulin  dependent type 2 diabetes mellitus (HCC)   Sepsis due to pneumonia [facility])   Discharge Condition: improved  Diet recommendation: heart healthy  Filed Weights   03/14/24 2117  Weight: 113.4 kg    History of present illness:  From admission h and p [name] is a 48 y.o. female with medical history significant for morbid obesity, lymphoma bilateral parotid gland s/p Rituxan  in 2020, diffuse large B-cell lymphoma,  lupus s/p liver transplant, currently on tacrolimus , neuropathy, major depressive disorder, post hepatic neuralgia, sleep apnea, who presents to the emergency department for chief concerns of fever and cough. In the ED Tmax of 103.4 with pulse 98, BP 142/81, O2 sat 90% on room air, placed on O2 at 2 L.  Briefly tachypneic to 27.  Labs notable for normal WBC of 5.3 and lactic acid 1.4 and negative respiratory viral panel.  Creatinine at baseline of 1.11, blood glucose 270, urinalysis unremarkable.  EKG showing sinus rhythm at 83 CTA PE protocol negative for PE but showing early asymmetric edema infection CT abdomen and pelvis with contrast showing liver nodularity suggestive of cirrhosis with distal esophageal varices and mild splenomegaly but no acute  findings    Hospital Course:  Patient presents with fever and cough, found to have viral pneumonia 2/2 metapneumovirus. Sepsis by fever, lactic acidosis, now resolved. Hypoxic initially, now weaned to room air, no hypoxia with ambulation, ambulated satisfactorily per PT, HH advised but patient declines. Other chronic medical problems stable. Advise close pcp f/u, given low-normal BPs advise holding lisinopril  pending that f/u. Continuing tacrolimus  as infection not felt to be severe.   Procedures: none   Consultations: none  Discharge Exam: Vitals:   03/16/24 0844 03/16/24 1250  BP: 116/72 111/74  Pulse: 75 64  Resp: 18 20  Temp: 99.4 F (37.4 C) 98.8 F (37.1 C)  SpO2: 91% 91%    General: NAD Cardiovascular: RRR Respiratory: coarse breath sounds, no increased wob  Discharge Instructions   Discharge Instructions     Diet - low sodium heart healthy   Complete by: As directed    Increase activity slowly   Complete by: As directed       Allergies as of 03/16/2024       Reactions   Penicillin G Itching, Rash, Anaphylaxis, Palpitations   Lamictal  [lamotrigine ] Rash   Tape    Carbamazepine Other (See Comments)   Medication interaction-prograf    Hydrocodone-acetaminophen  Itching   Naproxen Itching   Penicillins    Vancomycin  Rash   Macular/blotchy rash at infusion site        Medication List     PAUSE taking these medications    lisinopril  20 MG tablet Wait to take this until your doctor or other care provider tells you to start  again. Commonly known as: ZESTRIL  TAKE 1 TABLET BY MOUTH ONCE DAILY *PATIENT WILL NEED TO SCHEDULE AN APPOINTMENT FOR ADDITIONAL REFILLS*       STOP taking these medications    carvedilol 3.125 MG tablet Commonly known as: COREG   ezetimibe  10 MG tablet Commonly known as: ZETIA    ibuprofen  400 MG tablet Commonly known as: ADVIL    nitrofurantoin  (macrocrystal-monohydrate) 100 MG capsule Commonly known as: MACROBID     predniSONE  10 MG (21) Tbpk tablet Commonly known as: STERAPRED UNI-PAK 21 TAB   XyliMelts 550 MG Disk Generic drug: Xylitol       TAKE these medications    Accu-Chek Softclix Lancets lancets 2 (two) times daily.   albuterol  108 (90 Base) MCG/ACT inhaler Commonly known as: VENTOLIN  HFA Inhale 2 puffs into the lungs every 6 (six) hours as needed for wheezing or shortness of breath.   AZO CRANBERRY PO Take by mouth.   baclofen  10 MG tablet Commonly known as: LIORESAL  Take 1 tablet by mouth 2 (two) times daily.   Basaglar  KwikPen 100 UNIT/ML Inject 40 Units into the skin at bedtime.   benzonatate  100 MG capsule Commonly known as: TESSALON  Take 2 capsules (200 mg total) by mouth 2 (two) times daily as needed for cough.   benztropine  1 MG tablet Commonly known as: COGENTIN  TAKE 1 TABLET BY MOUTH ONCE DAILY AS NEEDED FOR TREMORS   fluticasone  50 MCG/ACT nasal spray Commonly known as: FLONASE  Place into the nose.   glipiZIDE  10 MG 24 hr tablet Commonly known as: GLUCOTROL  XL Take 1 tablet (10 mg total) by mouth in the morning and at bedtime.   Insulin  Pen Needle 32G X 6 MM Misc 1 each by Does not apply route as needed.   lurasidone  20 MG Tabs tablet Commonly known as: LATUDA  TAKE 1 TABLET BY MOUTH ONCE DAILY WITH SUPPER *REFILL REQUEST*   magnesium  oxide 400 MG tablet Commonly known as: MAG-OX Take 1,000 mg by mouth 2 (two) times daily.   Melatonin 10 MG Tabs Take 10 mg by mouth at bedtime.   metFORMIN  1000 MG tablet Commonly known as: GLUCOPHAGE  TAKE 1 TABLET BY MOUTH TWICE DAILY WITH MEALS *PATIENT WILL NEED TO SCHEDULE APPOINTMENT FOR ADDITIONAL REFILLS*   mirabegron  ER 50 MG Tb24 tablet Commonly known as: Myrbetriq  Take 1 tablet (50 mg total) by mouth daily.   mirtazapine  15 MG tablet Commonly known as: REMERON  TAKE 1 TABLET BY MOUTH EVERY DAY AT BEDTIME *REFILL REQUEST*   naloxone 4 MG/0.1ML Liqd nasal spray kit Commonly known as: NARCAN Place  0.4 mg into the nose once.   NovoLOG  FlexPen 100 UNIT/ML FlexPen Generic drug: insulin  aspart Inject 0-30 Units into the skin 3 (three) times daily with meals. Sliding scale   ondansetron  4 MG tablet Commonly known as: ZOFRAN  Take 1 tablet (4 mg total) by mouth every 6 (six) hours as needed for nausea.   oxyCODONE  5 MG immediate release tablet Commonly known as: Oxy IR/ROXICODONE  Take 5 mg by mouth 3 (three) times daily as needed.   Precision QID Test test strip Generic drug: glucose blood   pregabalin  200 MG capsule Commonly known as: LYRICA  Take 1 capsule (200 mg total) by mouth 2 (two) times daily.   rosuvastatin  10 MG tablet Commonly known as: Crestor  Take 1 tablet (10 mg total) by mouth daily.   Semaglutide (0.25 or 0.5MG /DOS) 2 MG/3ML Sopn Inject 0.25 mg into the skin once a week.   tacrolimus  1 MG capsule Commonly known as: PROGRAF  Take  1 capsule by mouth every morning.   Tylenol  PM Extra Strength 1000-50 MG/30ML Liqd Generic drug: diphenhydrAMINE -APAP (sleep) Take by mouth.   Ubrelvy  50 MG Tabs Generic drug: Ubrogepant  Take 1 tablet by mouth daily as needed.   venlafaxine  XR 75 MG 24 hr capsule Commonly known as: EFFEXOR -XR Take 1 capsule (75 mg total) by mouth daily with breakfast. Take along with 37.5 mg daily   venlafaxine  XR 37.5 MG 24 hr capsule Commonly known as: EFFEXOR -XR Take 37.5 mg by mouth every morning.   Vitamin D  125 MCG (5000 UT) Caps Take 5,000 Units by mouth daily.       Allergies  Allergen Reactions   Penicillin G Itching, Rash, Anaphylaxis and Palpitations   Lamictal  [Lamotrigine ] Rash   Tape    Carbamazepine Other (See Comments)    Medication interaction-prograf     Hydrocodone-Acetaminophen  Itching   Naproxen Itching   Penicillins    Vancomycin  Rash    Macular/blotchy rash at infusion site    Follow-up Information     Alison Buckler, FNP Follow up.   Specialty: Nurse Practitioner Why: in about 1 week Contact  information: [address] [place] [address] [phone]                  The results of significant diagnostics from this hospitalization (including imaging, microbiology, ancillary and laboratory) are listed below for reference.    Significant Diagnostic Studies: CT ABDOMEN PELVIS W CONTRAST Result Date: 03/14/2024 CLINICAL DATA:  Abdominal pain, acute, nonlocalized right flank and abd pain EXAM: CT ABDOMEN AND PELVIS WITH CONTRAST TECHNIQUE: Multidetector CT imaging of the abdomen and pelvis was performed using the standard protocol following bolus administration of intravenous contrast. RADIATION DOSE REDUCTION: This exam was performed according to the departmental dose-optimization program which includes automated exposure control, adjustment of the mA and/or kV according to patient size and/or use of iterative reconstruction technique. CONTRAST:  OMNIPAQUE  IOHEXOL  350 MG/ML SOLN COMPARISON:  03/06/2023 FINDINGS: Lower chest: See chest CT report Hepatobiliary: Subtle nodular contours of the liver surface. Recommend clinical correlation for possible early cirrhosis. No focal hepatic abnormality. Prior cholecystectomy. Pancreas: No focal abnormality or ductal dilatation. Spleen: Splenomegaly with a craniocaudal length of 14 cm. No focal abnormality. Adrenals/Urinary Tract: Atrophy of the left kidney. Areas of scarring in the kidneys bilaterally. Small bilateral renal cysts are stable. No follow-up imaging recommended. No stones or hydronephrosis. Adrenal glands and urinary bladder unremarkable. Stomach/Bowel: Stomach, large and small bowel grossly unremarkable. Vascular/Lymphatic: No evidence of aneurysm or adenopathy. Scattered aortic atherosclerosis. Prominent vessels around the distal esophagus compatible with distal esophageal varices. Reproductive: Uterus and adnexa unremarkable.  No mass. Other: No free fluid or free air. Musculoskeletal: No acute bony  abnormality. IMPRESSION: Subtle nodularity to the liver surface suggests cirrhosis. There are distal esophageal varices and mild splenomegaly. No acute findings in the abdomen or pelvis. Electronically Signed   By: Janeece Mechanic M.D.   On: 03/14/2024 22:41   CT Angio Chest PE W and/or Wo Contrast Result Date: 03/14/2024 CLINICAL DATA:  Pulmonary embolism (PE) suspected, high prob. Shortness of breath, fever, cough EXAM: CT ANGIOGRAPHY CHEST WITH CONTRAST TECHNIQUE: Multidetector CT imaging of the chest was performed using the standard protocol during bolus administration of intravenous contrast. Multiplanar CT image reconstructions and MIPs were obtained to evaluate the vascular anatomy. RADIATION DOSE REDUCTION: This exam was performed according to the departmental dose-optimization program which includes automated exposure control, adjustment of the mA and/or kV according  to patient size and/or use of iterative reconstruction technique. CONTRAST:  OMNIPAQUE  IOHEXOL  350 MG/ML SOLN COMPARISON:  02/23/2024 FINDINGS: Cardiovascular: No filling defects in the pulmonary arteries to suggest pulmonary emboli. Heart borderline in size. Scattered coronary artery and aortic calcifications. Mediastinum/Nodes: No mediastinal, hilar, or axillary adenopathy. Trachea and esophagus are unremarkable. Thyroid  unremarkable. Lungs/Pleura: Ground-glass airspace opacities in the left upper lobe and left lower lobe as well as right lower lobe. Findings could reflect early asymmetric edema or infection. No effusions. Upper Abdomen: See abdominal CT report today. Musculoskeletal: Chest wall soft tissues are unremarkable. No acute bony abnormality. Review of the MIP images confirms the above findings. IMPRESSION: No evidence of pulmonary embolus. Borderline cardiomegaly.  Scattered coronary artery calcifications. Scattered ground-glass opacities in the lungs bilaterally, left slightly greater than right. This could reflect early  asymmetric edema or infection. Aortic Atherosclerosis (ICD10-I70.0). Electronically Signed   By: Janeece Mechanic M.D.   On: 03/14/2024 22:36   DG Chest Port 1 View Result Date: 03/14/2024 CLINICAL DATA:  Shortness of breath, fever, cough EXAM: PORTABLE CHEST 1 VIEW COMPARISON:  02/26/2024 FINDINGS: Left Port-A-Cath remains in place, unchanged. Heart is borderline in size. Mediastinal contours are within normal limits. Mild peribronchial thickening. No confluent airspace opacities, effusions or edema. No acute bony abnormality. IMPRESSION: Borderline heart size. Mild bronchitic changes. Electronically Signed   By: Janeece Mechanic M.D.   On: 03/14/2024 22:10   DG Chest 2 View Result Date: 02/26/2024 CLINICAL DATA:  Chest pain short of breath EXAM: CHEST - 2 VIEW COMPARISON:  02/23/2024 FINDINGS: Left-sided central venous port tip over the SVC. No acute airspace disease, pleural effusion or pneumothorax. Enlarged cardiomediastinal silhouette. IMPRESSION: No active cardiopulmonary disease. Cardiomegaly. Electronically Signed   By: Esmeralda Hedge M.D.   On: 02/26/2024 17:52   CT Angio Chest PE W and/or Wo Contrast Result Date: 02/23/2024 CLINICAL DATA:  Concern for pulmonary embolism. EXAM: CT ANGIOGRAPHY CHEST WITH CONTRAST TECHNIQUE: Multidetector CT imaging of the chest was performed using the standard protocol during bolus administration of intravenous contrast. Multiplanar CT image reconstructions and MIPs were obtained to evaluate the vascular anatomy. RADIATION DOSE REDUCTION: This exam was performed according to the departmental dose-optimization program which includes automated exposure control, adjustment of the mA and/or kV according to patient size and/or use of iterative reconstruction technique. CONTRAST:  75mL OMNIPAQUE  IOHEXOL  350 MG/ML SOLN COMPARISON:  CT dated 03/06/2023. FINDINGS: Cardiovascular: Mild cardiomegaly. No pericardial effusion. The thoracic aorta is unremarkable. No pulmonary artery  embolus identified. Mediastinum/Nodes: Mildly enlarged right hilar lymph nodes measure 15 mm short axis. The esophagus is grossly unremarkable. No mediastinal fluid collection. Lungs/Pleura: Minimal bibasilar atelectasis. No focal consolidation, pleural effusion or pneumothorax. The central airways are patent. Upper Abdomen: Enlarged and irregular liver suggestive of cirrhosis. Musculoskeletal: No acute osseous pathology. Review of the MIP images confirms the above findings. IMPRESSION: 1. No acute intrathoracic pathology. No CT evidence of pulmonary artery embolus. 2. Mild cardiomegaly. 3. Cirrhosis. Electronically Signed   By: Angus Bark M.D.   On: 02/23/2024 09:32   DG Chest 2 View Result Date: 02/23/2024 CLINICAL DATA:  Chest pain EXAM: CHEST - 2 VIEW COMPARISON:  10/15/2023 FINDINGS: Lungs are well expanded, symmetric, and clear. No pneumothorax or pleural effusion. Cardiac size within normal limits. Left subclavian chest port tip seen at the superior cavoatrial junction. Pulmonary vascularity is normal. Osseous structures are age-appropriate. No acute bone abnormality. IMPRESSION: No active cardiopulmonary disease. Electronically Signed   By: Phylis Breeding.D.  On: 02/23/2024 03:49    Microbiology: Recent Results (from the past 240 hours)  Blood Culture (routine x 2)     Status: None (Preliminary result)   Collection Time: 03/14/24  9:43 PM   Specimen: BLOOD  Result Value Ref Range Status   Specimen Description BLOOD BLOOD LEFT FOREARM  Final   Special Requests   Final    BOTTLES DRAWN AEROBIC AND ANAEROBIC Blood Culture adequate volume   Culture   Final    NO GROWTH 2 DAYS Performed at [facility], [address], [place], [place] [address]    Report Status PENDING  Incomplete  Blood Culture (routine x 2)     Status: None (Preliminary result)   Collection Time: 03/14/24  9:43 PM   Specimen: BLOOD  Result Value Ref Range Status   Specimen Description BLOOD BLOOD  RIGHT ARM  Final   Special Requests   Final    BOTTLES DRAWN AEROBIC AND ANAEROBIC Blood Culture adequate volume   Culture   Final    NO GROWTH 2 DAYS Performed at [facility], [address] [place], [place] [address]    Report Status PENDING  Incomplete  Resp panel by RT-PCR (RSV, Flu A&B, Covid) Anterior Nasal Swab     Status: None   Collection Time: 03/14/24 11:48 PM   Specimen: Anterior Nasal Swab  Result Value Ref Range Status   SARS Coronavirus 2 by RT PCR NEGATIVE NEGATIVE Final    Comment: (NOTE) SARS-CoV-2 target nucleic acids are NOT DETECTED.  The SARS-CoV-2 RNA is generally detectable in upper respiratory specimens during the acute phase of infection. The lowest concentration of SARS-CoV-2 viral copies this assay can detect is 138 copies/mL. A negative result does not preclude SARS-Cov-2 infection and should not be used as the sole basis for treatment or other patient management decisions. A negative result may occur with  improper specimen collection/handling, submission of specimen other than nasopharyngeal swab, presence of viral mutation(s) within the areas targeted by this assay, and inadequate number of viral copies(<138 copies/mL). A negative result must be combined with clinical observations, patient history, and epidemiological information. The expected result is Negative.  Fact Sheet for Patients:  BloggerCourse.com  Fact Sheet for Healthcare Providers:  SeriousBroker.it  This test is no t yet approved or cleared by the [place]  FDA and  has been authorized for detection and/or diagnosis of SARS-CoV-2 by FDA under an Emergency Use Authorization (EUA). This EUA will remain  in effect (meaning this test can be used) for the duration of the COVID-19 declaration under Section 564(b)(1) of the Act, 21 U.S.C.section 360bbb-3(b)(1), unless the authorization is terminated  or revoked sooner.        Influenza A by PCR NEGATIVE NEGATIVE Final   Influenza B by PCR NEGATIVE NEGATIVE Final    Comment: (NOTE) The Xpert Xpress SARS-CoV-2/FLU/RSV plus assay is intended as an aid in the diagnosis of influenza from Nasopharyngeal swab specimens and should not be used as a sole basis for treatment. Nasal washings and aspirates are unacceptable for Xpert Xpress SARS-CoV-2/FLU/RSV testing.  Fact Sheet for Patients: BloggerCourse.com  Fact Sheet for Healthcare Providers: SeriousBroker.it  This test is not yet approved or cleared by the [place]  FDA and has been authorized for detection and/or diagnosis of SARS-CoV-2 by FDA under an Emergency Use Authorization (EUA). This EUA will remain in effect (meaning this test can be used) for the duration of the COVID-19 declaration under Section 564(b)(1) of the Act, 21  U.S.C. section 360bbb-3(b)(1), unless the authorization is terminated or revoked.     Resp Syncytial Virus by PCR NEGATIVE NEGATIVE Final    Comment: (NOTE) Fact Sheet for Patients: BloggerCourse.com  Fact Sheet for Healthcare Providers: SeriousBroker.it  This test is not yet approved or cleared by the [place]  FDA and has been authorized for detection and/or diagnosis of SARS-CoV-2 by FDA under an Emergency Use Authorization (EUA). This EUA will remain in effect (meaning this test can be used) for the duration of the COVID-19 declaration under Section 564(b)(1) of the Act, 21 U.S.C. section 360bbb-3(b)(1), unless the authorization is terminated or revoked.  Performed at [facility], [address] [place], [place] [address]   MRSA Next Gen by PCR, Nasal     Status: None   Collection Time: 03/15/24  8:48 AM   Specimen: Nasal Mucosa; Nasal Swab  Result Value Ref Range Status   MRSA by PCR Next Gen NOT DETECTED NOT DETECTED Final    Comment:  (NOTE) The GeneXpert MRSA Assay (FDA approved for NASAL specimens only), is one component of a comprehensive MRSA colonization surveillance program. It is not intended to diagnose MRSA infection nor to guide or monitor treatment for MRSA infections. Test performance is not FDA approved in patients less than 54 years old. Performed at [facility], [address] [place], [place] [address]   Respiratory (~20 pathogens) panel by PCR     Status: Abnormal   Collection Time: 03/15/24  8:48 AM   Specimen: Nasal Mucosa; Respiratory  Result Value Ref Range Status   Adenovirus NOT DETECTED NOT DETECTED Final   Coronavirus 229E NOT DETECTED NOT DETECTED Final    Comment: (NOTE) The Coronavirus on the Respiratory Panel, DOES NOT test for the novel  Coronavirus (2019 nCoV)    Coronavirus HKU1 NOT DETECTED NOT DETECTED Final   Coronavirus NL63 NOT DETECTED NOT DETECTED Final   Coronavirus OC43 NOT DETECTED NOT DETECTED Final   Metapneumovirus DETECTED (A) NOT DETECTED Final   Rhinovirus / Enterovirus NOT DETECTED NOT DETECTED Final   Influenza A NOT DETECTED NOT DETECTED Final   Influenza B NOT DETECTED NOT DETECTED Final   Parainfluenza Virus 1 NOT DETECTED NOT DETECTED Final   Parainfluenza Virus 2 NOT DETECTED NOT DETECTED Final   Parainfluenza Virus 3 NOT DETECTED NOT DETECTED Final   Parainfluenza Virus 4 NOT DETECTED NOT DETECTED Final   Respiratory Syncytial Virus NOT DETECTED NOT DETECTED Final   Bordetella pertussis NOT DETECTED NOT DETECTED Final   Bordetella Parapertussis NOT DETECTED NOT DETECTED Final   Chlamydophila pneumoniae NOT DETECTED NOT DETECTED Final   Mycoplasma pneumoniae NOT DETECTED NOT DETECTED Final    Comment: Performed at [facility], [address] [place], [place] [address]  Expectorated Sputum Assessment w Gram Stain, Rflx to Resp Cult     Status: None   Collection Time: 03/15/24  8:48 AM   Specimen: Nasal Mucosa; Sputum  Result Value Ref  Range Status   Specimen Description EXPECTORATED SPUTUM  Final   Special Requests NONE  Final   Sputum evaluation   Final    THIS SPECIMEN IS ACCEPTABLE FOR SPUTUM CULTURE Performed at [facility], [address] [place], [place] [address]    Report Status 03/15/2024 FINAL  Final  Culture, Respiratory w Gram Stain     Status: None (Preliminary result)   Collection Time: 03/15/24  8:48 AM  Result Value Ref Range Status   Specimen Description   Final  EXPECTORATED SPUTUM Performed at [facility], [address] [place], [place] [address]    Special Requests   Final    NONE Reflexed from [id number] Performed at [facility], [address] [place], [place] [address]    Gram Stain   Final    ABUNDANT WBC PRESENT, PREDOMINANTLY PMN RARE GRAM POSITIVE COCCI FEW GRAM NEGATIVE RODS    Culture   Final    CULTURE REINCUBATED FOR BETTER GROWTH Performed at [facility], [address], [place], [place] [address]    Report Status PENDING  Incomplete     Labs: Basic Metabolic Panel: Recent Labs  Lab 03/14/24 2145 03/16/24 0728  NA 134* 136  K 4.4 4.1  CL 103 107  CO2 19* 23  GLUCOSE 270* 173*  BUN 16 16  CREATININE 1.11* 0.95  CALCIUM  8.8* 8.0*   Liver Function Tests: Recent Labs  Lab 03/14/24 2145  AST 30  ALT 16  ALKPHOS 61  BILITOT 0.9  PROT 7.0  ALBUMIN 3.6   No results for input(s): "LIPASE", "AMYLASE" in the last 168 hours. No results for input(s): "AMMONIA" in the last 168 hours. CBC: Recent Labs  Lab 03/14/24 2145 03/16/24 0728  WBC 5.3 4.8  NEUTROABS 2.7  --   HGB 12.2 11.1*  HCT 38.2 32.9*  MCV 90.7 87.7  PLT 115* 102*   Cardiac Enzymes: No results for input(s): "CKTOTAL", "CKMB", "CKMBINDEX", "TROPONINI" in the last 168 hours. BNP: BNP (last 3 results) No results for input(s): "BNP" in the last 8760 hours.  ProBNP (last 3 results) No results for input(s): "PROBNP" in the last 8760 hours.  CBG: Recent  Labs  Lab 03/15/24 1201 03/15/24 1735 03/15/24 2002 03/16/24 0841 03/16/24 1250  GLUCAP 287* 234* 326* 200* 236*       Signed:  Raymonde Calico MD.  [facility] 03/16/2024, 2:28 PM

## 2024-03-16 NOTE — Plan of Care (Signed)
  Problem: Fluid Volume: Goal: Hemodynamic stability will improve Outcome: Progressing   Problem: Clinical Measurements: Goal: Diagnostic test results will improve Outcome: Progressing   Problem: Clinical Measurements: Goal: Signs and symptoms of infection will decrease Outcome: Progressing   Problem: Respiratory: Goal: Ability to maintain adequate ventilation will improve Outcome: Progressing   Problem: Education: Goal: Ability to describe self-care measures that may prevent or decrease complications (Diabetes Survival Skills Education) will improve Outcome: Progressing   Problem: Coping: Goal: Ability to adjust to condition or change in health will improve Outcome: Progressing   Problem: Fluid Volume: Goal: Ability to maintain a balanced intake and output will improve Outcome: Progressing   Problem: Metabolic: Goal: Ability to maintain appropriate glucose levels will improve Outcome: Progressing   Problem: Skin Integrity: Goal: Risk for impaired skin integrity will decrease Outcome: Progressing   Problem: Clinical Measurements: Goal: Ability to maintain clinical measurements within normal limits will improve Outcome: Progressing   Problem: Clinical Measurements: Goal: Will remain free from infection Outcome: Progressing   Problem: Clinical Measurements: Goal: Diagnostic test results will improve Outcome: Progressing   Problem: Clinical Measurements: Goal: Respiratory complications will improve Outcome: Progressing   Problem: Safety: Goal: Ability to remain free from injury will improve Outcome: Progressing   Plan of care ongoing, see MAR see flowsheet

## 2024-03-16 NOTE — Evaluation (Signed)
 Physical Therapy Evaluation Patient Details Name: Alison [name] MRN: [id number] DOB: 1976-03-08 Today's Date: 03/16/2024  History of Present Illness  48 y/o female presented to ED on 03/14/2024 with a fever and cough. Admitted for sepsis secondary to pneumonia. PMH:  morbid obesity, lymphoma bilateral parotid gland, diffuse large B-cell lymphoma,  lupus s/p liver transplant, HTN, neuropathy, major depressive disorder, bipolar disorder, DM, Dyspnea, trigeminal neuralgia, GERD, Neuropathy post hepatic neuralgia, sleep apnea.  Clinical Impression  Prior to recent medical concerns, pt reports being independent with ADLs, ambulating ~ 4,000 steps per day, utilizing [facility] for moderate distances(grocery shopping) and a power chair for extended distances; she currently lives in an apartment on the 3rd floor (access to elevator, no STE) with her fiance and her brother in law (who can provide 24 hr support if needed). Pt was able to perform bed mobility/transfers with SBA and ambulated 70 ft with CGA for increased safety. Pt was able to perform all aforementioned activities on room air. During bed mobility she reported slight SOB( vitals read as follows HR: 87 bpm, SpO2: 98%), which resolved with a short rest break. Currently pt presents with decreased activity tolerance, decreased strength, decreased endurance, and would benefit from skilled PT to address noted impairments and functional limitations (see below for any additional details).  Upon hospital discharge, pt would benefit from ongoing therapy to return to PLOF.       If plan is discharge home, recommend the following: A little help with walking and/or transfers;Assistance with cooking/housework;A little help with bathing/dressing/bathroom;Assist for transportation;Help with stairs or ramp for entrance   Can travel by private vehicle        Equipment Recommendations    Recommendations for Other Services       Functional Status Assessment  Patient has had a recent decline in their functional status and demonstrates the ability to make significant improvements in function in a reasonable and predictable amount of time.     Precautions / Restrictions Precautions Precautions: None Restrictions Weight Bearing Restrictions Per Provider Order: No      Mobility  Bed Mobility Overal bed mobility: Needs Assistance             General bed mobility comments: BUE on handrails/ on bed to assist with trunk stability, reports of SOB with activity SpO2: 94+% on room air.    Transfers Overall transfer level: Needs assistance                 General transfer comment: vc's for hand placement to get out of bed, SBA for safety    Ambulation/Gait Ambulation/Gait assistance: Contact guard assist Gait Distance (Feet): 70 Feet   Gait Pattern/deviations: Step-through pattern, Decreased step length - right, Decreased step length - left Gait velocity: decreased     General Gait Details: vc's for deep breaths to prevent SOB  Stairs            Wheelchair Mobility     Tilt Bed    Modified Rankin (Stroke Patients Only)       Balance Overall balance assessment: Needs assistance Sitting-balance support: Bilateral upper extremity supported Sitting balance-Leahy Scale: Good Sitting balance - Comments: Good static seated balance                                     Pertinent Vitals/Pain Pain Assessment Pain Assessment: 0-10 Pain Score: 0-No pain Pain Intervention(s): Monitored during session  Home Living Family/patient expects to be discharged to:: Private residence Living Arrangements: Spouse/significant other;Other relatives Available Help at Discharge: Family Type of Home: Apartment Home Access: Elevator       Home Layout: One level Home Equipment: Cane - quad;Shower seat;Wheelchair - power      Prior Function Prior Level of Function : Independent/Modified Independent;Driving              Mobility Comments: no ADs for household ambulation, quad cane for longer distances (going to [employer]), power chair ( for extended distances)       Extremity/Trunk Assessment   Upper Extremity Assessment Upper Extremity Assessment: Defer to OT evaluation    Lower Extremity Assessment Lower Extremity Assessment: Generalized weakness;RLE deficits/detail;LLE deficits/detail RLE Deficits / Details: Hip flexion: 4-/5, Knee flexion: 5/5, Knee extension: 5/5, DF: 5/5 LLE Deficits / Details: Hip flexion: 4-/5, Knee flexion: 4/5, Knee extension: 4/5, DF: 5/5       Communication   Communication Communication: No apparent difficulties    Cognition Arousal: Alert Behavior During Therapy: Anxious   PT - Cognitive impairments: No apparent impairments                         Following commands: Intact       Cueing Cueing Techniques: Verbal cues     General Comments General comments (skin integrity, edema, etc.): Pre activity: HR:62 bpm, Post bed mobility: HR: 87 bpm, Post activity: 72 bpm, SpO2: 93+% on room air.    Exercises     Assessment/Plan    PT Assessment Patient needs continued PT services  PT Problem List Decreased strength;Decreased balance;Decreased mobility;Decreased activity tolerance       PT Treatment Interventions Functional mobility training;DME instruction;Balance training;Patient/family education;Therapeutic activities;Therapeutic exercise;Gait training;Stair training    PT Goals (Current goals can be found in the Care Plan section)  Acute Rehab PT Goals Patient Stated Goal: To return to previous function PT Goal Formulation: With patient Time For Goal Achievement: 03/30/24 Potential to Achieve Goals: Good    Frequency Min 1X/week     Co-evaluation               AM-PAC PT "6 Clicks" Mobility  Outcome Measure Help needed turning from your back to your side while in a flat bed without using bedrails?: None Help needed  moving from lying on your back to sitting on the side of a flat bed without using bedrails?: None Help needed moving to and from a bed to a chair (including a wheelchair)?: A Little Help needed standing up from a chair using your arms (e.g., wheelchair or bedside chair)?: A Little Help needed to walk in hospital room?: A Little Help needed climbing 3-5 steps with a railing? : A Lot 6 Click Score: 19    End of Session Equipment Utilized During Treatment: Gait belt Activity Tolerance: Patient tolerated treatment well;Patient limited by fatigue Patient left: in bed;with call bell/phone within reach Nurse Communication: Mobility status;Precautions (Messaged via secure chat) PT Visit Diagnosis: Muscle weakness (generalized) (M62.81)    Time: 1610-9604 PT Time Calculation (min) (ACUTE ONLY): 19 min   Charges:               Dakai Braithwaite, SPT 03/16/24, 4:10 PM

## 2024-03-17 ENCOUNTER — Encounter: Admitting: Physical Therapy

## 2024-03-17 ENCOUNTER — Encounter

## 2024-03-17 LAB — CULTURE, RESPIRATORY W GRAM STAIN

## 2024-03-17 LAB — LEGIONELLA PNEUMOPHILA SEROGP 1 UR AG: L. pneumophila Serogp 1 Ur Ag: NEGATIVE

## 2024-03-18 LAB — CULTURE, BLOOD (ROUTINE X 2)
Culture: NO GROWTH
Culture: NO GROWTH
Special Requests: ADEQUATE
Special Requests: ADEQUATE

## 2024-03-22 ENCOUNTER — Encounter

## 2024-03-22 NOTE — Progress Notes (Signed)
 Complex Care Management Care Guide Note  03/22/2024 Name: MARKELLE ASARO MRN: [id number] DOB: [birth year]  Vonzella Guernsey is a 48 y.o. year old female who is a primary care patient of Quinton Buckler, FNP and is actively engaged with the care management team. I reached out to Vonzella Guernsey by phone today to assist with re-scheduling  with the RN Case Manager.  Follow up plan: Telephone appointment with complex care management team member scheduled for:  03/29/2024  Lenton Rail , RMA     [place]  [facility] Guide  Direct Dial: [phone]  Website: [email or web address]

## 2024-03-24 ENCOUNTER — Encounter: Admitting: Physical Therapy

## 2024-03-24 ENCOUNTER — Encounter

## 2024-03-25 DIAGNOSIS — F331 Major depressive disorder, recurrent, moderate: Secondary | ICD-10-CM | POA: Diagnosis not present

## 2024-03-25 DIAGNOSIS — F411 Generalized anxiety disorder: Secondary | ICD-10-CM | POA: Diagnosis not present

## 2024-03-25 DIAGNOSIS — F4310 Post-traumatic stress disorder, unspecified: Secondary | ICD-10-CM | POA: Diagnosis not present

## 2024-03-29 ENCOUNTER — Other Ambulatory Visit: Payer: Self-pay

## 2024-03-29 ENCOUNTER — Telehealth: Payer: Self-pay

## 2024-03-29 ENCOUNTER — Other Ambulatory Visit: Payer: Self-pay | Admitting: "Endocrinology

## 2024-03-29 ENCOUNTER — Emergency Department: Admission: EM | Admit: 2024-03-29 | Discharge: 2024-03-29 | Disposition: A | Discharge status: Home / Self Care

## 2024-03-29 ENCOUNTER — Emergency Department

## 2024-03-29 DIAGNOSIS — R059 Cough, unspecified: Secondary | ICD-10-CM | POA: Diagnosis not present

## 2024-03-29 DIAGNOSIS — R509 Fever, unspecified: Secondary | ICD-10-CM | POA: Diagnosis not present

## 2024-03-29 DIAGNOSIS — I10 Essential (primary) hypertension: Secondary | ICD-10-CM | POA: Insufficient documentation

## 2024-03-29 DIAGNOSIS — R0989 Other specified symptoms and signs involving the circulatory and respiratory systems: Secondary | ICD-10-CM | POA: Diagnosis not present

## 2024-03-29 DIAGNOSIS — E119 Type 2 diabetes mellitus without complications: Secondary | ICD-10-CM | POA: Diagnosis not present

## 2024-03-29 DIAGNOSIS — R0789 Other chest pain: Secondary | ICD-10-CM | POA: Diagnosis not present

## 2024-03-29 DIAGNOSIS — R9389 Abnormal findings on diagnostic imaging of other specified body structures: Secondary | ICD-10-CM | POA: Diagnosis not present

## 2024-03-29 DIAGNOSIS — R0602 Shortness of breath: Secondary | ICD-10-CM | POA: Diagnosis not present

## 2024-03-29 DIAGNOSIS — D72829 Elevated white blood cell count, unspecified: Secondary | ICD-10-CM | POA: Diagnosis not present

## 2024-03-29 DIAGNOSIS — Z4682 Encounter for fitting and adjustment of non-vascular catheter: Secondary | ICD-10-CM | POA: Diagnosis not present

## 2024-03-29 DIAGNOSIS — R079 Chest pain, unspecified: Secondary | ICD-10-CM | POA: Diagnosis not present

## 2024-03-29 LAB — BASIC METABOLIC PANEL WITH GFR
Anion gap: 13 (ref 5–15)
BUN: 20 mg/dL (ref 6–20)
CO2: 20 mmol/L — ABNORMAL LOW (ref 22–32)
Calcium: 9.3 mg/dL (ref 8.9–10.3)
Chloride: 102 mmol/L (ref 98–111)
Creatinine, Ser: 1.08 mg/dL — ABNORMAL HIGH (ref 0.44–1.00)
GFR, Estimated: >60 mL/min (ref >60)
Glucose, Bld: 231 mg/dL — ABNORMAL HIGH (ref 70–99)
Potassium: 5.1 mmol/L (ref 3.5–5.1)
Sodium: 135 mmol/L (ref 135–145)

## 2024-03-29 LAB — URINALYSIS, COMPLETE (UACMP) WITH MICROSCOPIC
Bilirubin Urine: NEGATIVE
Glucose, UA: 150 mg/dL — AB
Hgb urine dipstick: NEGATIVE
Ketones, ur: NEGATIVE mg/dL
Leukocytes,Ua: NEGATIVE
Nitrite: NEGATIVE
Protein, ur: 100 mg/dL — AB
Specific Gravity, Urine: 1.019 (ref 1.005–1.030)
pH: 5 (ref 5.0–8.0)

## 2024-03-29 LAB — RESP PANEL BY RT-PCR (RSV, FLU A&B, COVID)  RVPGX2
Influenza A by PCR: NEGATIVE
Influenza B by PCR: NEGATIVE
Resp Syncytial Virus by PCR: NEGATIVE
SARS Coronavirus 2 by RT PCR: NEGATIVE

## 2024-03-29 LAB — CBC
HCT: 41.2 % (ref 36.0–46.0)
Hemoglobin: 13.4 g/dL (ref 12.0–15.0)
MCH: 28.5 pg (ref 26.0–34.0)
MCHC: 32.5 g/dL (ref 30.0–36.0)
MCV: 87.7 fL (ref 80.0–100.0)
Platelets: 209 10*3/uL (ref 150–400)
RBC: 4.7 MIL/uL (ref 3.87–5.11)
RDW: 14.7 % (ref 11.5–15.5)
WBC: 11 10*3/uL — ABNORMAL HIGH (ref 4.0–10.5)
nRBC: 0 % (ref 0.0–0.2)

## 2024-03-29 LAB — HEPATIC FUNCTION PANEL
ALT: 14 U/L (ref 0–44)
AST: 22 U/L (ref 15–41)
Albumin: 3.1 g/dL — ABNORMAL LOW (ref 3.5–5.0)
Alkaline Phosphatase: 68 U/L (ref 38–126)
Bilirubin, Direct: 0.5 mg/dL — ABNORMAL HIGH (ref 0.0–0.2)
Indirect Bilirubin: 1.3 mg/dL — ABNORMAL HIGH (ref 0.3–0.9)
Total Bilirubin: 1.8 mg/dL — ABNORMAL HIGH (ref 0.0–1.2)
Total Protein: 6.4 g/dL — ABNORMAL LOW (ref 6.5–8.1)

## 2024-03-29 LAB — LIPASE, BLOOD: Lipase: 37 U/L (ref 11–51)

## 2024-03-29 LAB — LACTIC ACID, PLASMA
Lactic Acid, Venous: 2.6 mmol/L (ref 0.5–1.9)
Lactic Acid, Venous: 2.7 mmol/L (ref 0.5–1.9)
Lactic Acid, Venous: 1.8 mmol/L (ref 0.5–1.9)

## 2024-03-29 LAB — TROPONIN I (HIGH SENSITIVITY)
Troponin I (High Sensitivity): 7 ng/L (ref <18)
Troponin I (High Sensitivity): 7 ng/L (ref <18)

## 2024-03-29 MED ORDER — LIDOCAINE 5 % EX PTCH: 1.0000 | MEDICATED_PATCH | CUTANEOUS | 0 refills | Status: DC

## 2024-03-29 MED ORDER — PREDNISONE 20 MG PO TABS: 40.0000 mg | ORAL_TABLET | Freq: Every day | ORAL | 0 refills | Status: AC

## 2024-03-29 MED ORDER — LIDOCAINE 5 % EX PTCH
1.0000 | MEDICATED_PATCH | CUTANEOUS | Status: DC
  Administered 2024-03-29: 1 via TRANSDERMAL
  Filled 2024-03-29: qty 1

## 2024-03-29 MED ORDER — LIDOCAINE 5 % EX PTCH: 1.0000 | MEDICATED_PATCH | CUTANEOUS | 0 refills | Status: AC

## 2024-03-29 MED ORDER — PREDNISONE 20 MG PO TABS: 40.0000 mg | ORAL_TABLET | Freq: Every day | ORAL | 0 refills | Status: DC

## 2024-03-29 MED ORDER — ACETAMINOPHEN 500 MG PO TABS
1000.0000 mg | ORAL_TABLET | Freq: Once | ORAL | Status: AC
  Administered 2024-03-29: 1000 mg via ORAL
  Filled 2024-03-29: qty 2

## 2024-03-29 MED ORDER — SODIUM CHLORIDE 0.9 % IV BOLUS
1000.0000 mL | Freq: Once | INTRAVENOUS | Status: AC
  Administered 2024-03-29: 1000 mL via INTRAVENOUS

## 2024-03-29 MED ORDER — IOHEXOL 350 MG/ML SOLN
75.0000 mL | Freq: Once | INTRAVENOUS | Status: AC | PRN
  Administered 2024-03-29: 75 mL via INTRAVENOUS

## 2024-03-29 NOTE — ED Triage Notes (Addendum)
 Pt to ED via POV from home c/o central CP that started yesterday. Pain is nonradiating. Pain has been getting worse. Endorses some SOB. Denies N,V,D. Denies dizziness, fevers. Hx HTN, diabetes

## 2024-03-29 NOTE — ED Provider Notes (Signed)
 [facility] Provider Note    Event Date/Time   First MD Initiated Contact with Patient 03/29/24 [phone]     (approximate)   History   Chest Pain  Pt to ED via POV from home c/o central CP that started yesterday. Pain is nonradiating. Pain has been getting worse. Endorses some SOB. Denies N,V,D. Denies dizziness, fevers. Hx HTN, diabetes   HPI Alison [name] is a 48 y.o. female SLE status post liver transplant on tacrolimus , hypertension, diffuse large B-cell lymphoma, prior liver transplant, bipolar disorder presents for evaluation of chest pain - Patient states she developed left-sided chest pain yesterday morning.  Does have mild nonproductive cough.  Tells me she had a fever to 100.4 yesterday, none today, did take Motrin  at about 5 AM. - Mild pleuritic pain no exertional pain.  Pain is worse with twisting of torso.  No preceding trauma.  Feels similar to episodes of pain that she was having last. -No nausea/vomiting/diarrhea, abdominal pain - Has outpatient cardiology appointment scheduled in 3 days (04/01/2024) - No recent surgery/stasis/travel, no history of DVT/PE, no leg swelling  Per chart review, patient was admitted 03/14/2024-03/16/2024 after presenting with cough and fever, found to have human metapneumovirus pneumonia  Per chart review, appears also has had 2 other visits to emergency department last month for nonspecific chest discomfort.  First visit with a CTA chest with no evidence of underlying PE.  Also received a CTA chest for evaluation of chest pain earlier this month, also with no evidence of PE  Last NM perfusion scan 2022: Pharmacological myocardial perfusion imaging study with no significant  ischemia Normal wall motion, EF estimated at 66% No EKG changes concerning for ischemia at peak stress or in recovery. CT attenuation correction images with no significant coronary calcification or aortic atherosclerosis Low risk scan      Physical Exam   Triage Vital Signs: ED Triage Vitals  Encounter Vitals Group     BP 03/29/24 0513 108/81     Girls Systolic BP Percentile --      Girls Diastolic BP Percentile --      Boys Systolic BP Percentile --      Boys Diastolic BP Percentile --      Pulse Rate 03/29/24 0513 (!) 104     Resp 03/29/24 0513 20     Temp 03/29/24 0513 98.9 F (37.2 C)     Temp Source 03/29/24 0513 Oral     SpO2 03/29/24 0513 94 %     Weight 03/29/24 0516 250 lb (113.4 kg)     Height 03/29/24 0516 5' 6 (1.676 m)     Head Circumference --      Peak Flow --      Pain Score 03/29/24 0516 6     Pain Loc --      Pain Education --      Exclude from Growth Chart --     Most recent vital signs: Vitals:   03/29/24 1315 03/29/24 1335  BP: 103/64   Pulse: 78   Resp: 18   Temp:  98.1 F (36.7 C)  SpO2:       General: Awake, no distress.  CV:  Good peripheral perfusion. RRR, RP 2+. + Mild tenderness palpation of left anterior chest wall.  No overlying skin changes. Resp:  Normal effort. CTAB Abd:  No distention. Nontender to deep palpation throughout Other:     ED Results / Procedures / Treatments   Labs (all labs  ordered are listed, but only abnormal results are displayed) Labs Reviewed  BASIC METABOLIC PANEL WITH GFR - Abnormal; Notable for the following components:      Result Value   CO2 20 (*)    Glucose, Bld 231 (*)    Creatinine, Ser 1.08 (*)    All other components within normal limits  CBC - Abnormal; Notable for the following components:   WBC 11.0 (*)    All other components within normal limits  LACTIC ACID, PLASMA - Abnormal; Notable for the following components:   Lactic Acid, Venous 2.6 (*)    All other components within normal limits  LACTIC ACID, PLASMA - Abnormal; Notable for the following components:   Lactic Acid, Venous 2.7 (*)    All other components within normal limits  URINALYSIS, COMPLETE (UACMP) WITH MICROSCOPIC - Abnormal; Notable for the  following components:   Color, Urine YELLOW (*)    APPearance CLEAR (*)    Glucose, UA 150 (*)    Protein, ur 100 (*)    Bacteria, UA RARE (*)    All other components within normal limits  HEPATIC FUNCTION PANEL - Abnormal; Notable for the following components:   Total Protein 6.4 (*)    Albumin 3.1 (*)    Total Bilirubin 1.8 (*)    Bilirubin, Direct 0.5 (*)    Indirect Bilirubin 1.3 (*)    All other components within normal limits  RESP PANEL BY RT-PCR (RSV, FLU A&B, COVID)  RVPGX2  CULTURE, BLOOD (ROUTINE X 2)  CULTURE, BLOOD (ROUTINE X 2)  URINE CULTURE  LIPASE, BLOOD  LACTIC ACID, PLASMA  TROPONIN I (HIGH SENSITIVITY)  TROPONIN I (HIGH SENSITIVITY)     EKG  See ED course below   RADIOLOGY Chest x-ray interpreted by myself and radiology report reviewed.    PROCEDURES:  Critical Care performed: No  Procedures   MEDICATIONS ORDERED IN ED: Medications  lidocaine  (LIDODERM ) 5 % 1 patch (1 patch Transdermal Patch Applied 03/29/24 0800)  acetaminophen  (TYLENOL ) tablet 1,000 mg (1,000 mg Oral Given 03/29/24 0759)  sodium chloride  0.9 % bolus 1,000 mL (0 mLs Intravenous Stopped 03/29/24 1204)  iohexol  (OMNIPAQUE ) 350 MG/ML injection 75 mL (75 mLs Intravenous Contrast Given 03/29/24 0948)     IMPRESSION / MDM / ASSESSMENT AND PLAN / ED COURSE  I reviewed the triage vital signs and the nursing notes.                              DDX/MDM/AP: Differential diagnosis includes, but is not limited to, pleurisy, consider ACS, MSK strain, consider viral syndrome including COVID-19 or influenza, bacterial pneumonia.  Patient does appear very well here but tells me she had a fever yesterday and is on immunosuppression--will initiate broad workup including blood cultures and lactate.  Highly doubt pulmonary embolism based on history and exam and multiple recent negative CTA chests.  Plan: - Labs - EKG - Chest x-ray - Tylenol , Lidoderm  patch - Reassess  Patient's  presentation is most consistent with acute presentation with potential threat to life or bodily function.   ED course below.  Workup overall unremarkable.  Patient did have very mild leukocytosis at 11 as well as a mildly elevated lactate to 2.7 and normalized after single liter of IV fluid.  No clear source of infection here.  Her primary concern remains her chest pain though unremarkable cardiac workup including serial troponins, CT PE.  No evidence of underlying  pneumonia.  No clear evidence of UTI, pyuria though negative nitrite and leuk esterase though --patient asymptomatic.  Remains afebrile and very well-appearing here.  In shared decision-making with patient, she is comfortable with discharge home.  Offered empiric treatment of UTI though she declines antibiotics at this time which I believe is very reasonable.  Given her immunocompromise state, blood cultures and urine cultures collected out of abundance of caution, patient is comfortable following these as outpatient.  Patient avoids NSAIDs at baseline as she feels her body does not respond well to them, plan to continue low-dose Tylenol  at home and Rx Lidoderm  patches as well as short course of prednisone  for apparent pleurisy.  ED return precautions in place.  Patient agrees with plan.  Clinical Course as of 03/29/24 1439  Mon Mar 29, 2024  0713 BMP reviewed, overall unremarkable  CBC with mild leukocytosis  Troponin normal [MM]  0714 Chest x-ray reviewed, unremarkable: IMPRESSION: Low lung volumes. No acute findings.   [MM]  0714 Ecg = sinus rhythm, rate 99, right bundle branch block present.  No clear evidence of ischemia nor arrhythmia.  Left axis deviation. [MM]  0914 Rpt trop stable [MM]  0922 Viral swab neg [MM]  0923 Lactate elevated 2.6  Consider possibility of lactate secondary to infection given reported fever as well as new leukocytosis of steroids.  Will escalate to CT chest to evaluate for possible pneumonia. [MM]   [phone] Patient updated on findings.  Amenable to CT chest.  Again denies any abdominal pain and has no tenderness to deep palpation throughout abdomen.  Does have history of recurrent UTIs, will give urine sample shortly. [MM]  1126 CTA chest: IMPRESSION: No acute intrathoracic pathology. No CT evidence of pulmonary artery embolus.   [MM]  1257 Rpt lactate normalized [MM]  1432 Patient reevaluated, discussed reassuring workup.  Downtrending lactate.  No clear evidence of underlying bacterial pathology today.  No fevers here.  Patient looks and feels very well with stable vital signs.  Discussed multiple options of management including admission for observation, discharge home with treatment of possible UTI though patient denies any urinary symptoms and would prefer to defer any antibiotics-I do believe this is very reasonable.  Will add urine culture and also follow blood cultures as outpatient.  Patient has cardiology follow-up and PMD follow-up in 2 and half days.  Strict ED return precautions in place.  Patient agrees with plan. [MM]    Clinical Course User Index [MM] Clarine Ozell LABOR, MD     FINAL CLINICAL IMPRESSION(S) / ED DIAGNOSES   Final diagnoses:  Atypical chest pain     Rx / DC Orders   ED Discharge Orders          Ordered    predniSONE  (DELTASONE ) 20 MG tablet  Daily with breakfast        03/29/24 1437    lidocaine  (LIDODERM ) 5 %  Every 24 hours        03/29/24 1437             Note:  This document was prepared using Dragon voice recognition software and may include unintentional dictation errors.   Clarine Ozell LABOR, MD 03/29/24 1440

## 2024-03-29 NOTE — Discharge Instructions (Signed)
 Your evaluation in the emergency department is overall reassuring.  We saw no clear source of infection today, we saw no evidence of underlying heart problems.  Please do follow-up with a cardiologist as already planned as well as your primary care doctor in the next few days.  I have prescribed you a short course of steroids to help with your likely pleurisy.  Return to the emergency department with any new or worsening symptoms.  As discussed, blood and urine cultures were collected today, and you will be notified of any abnormal results.

## 2024-03-30 ENCOUNTER — Ambulatory Visit: Admitting: Physical Therapy

## 2024-03-30 ENCOUNTER — Encounter

## 2024-03-30 LAB — URINE CULTURE: Culture: <10000 — AB

## 2024-03-31 ENCOUNTER — Telehealth: Payer: Self-pay | Admitting: *Deleted

## 2024-03-31 NOTE — Telephone Encounter (Signed)
 LMOVM to verify card hx.

## 2024-03-31 NOTE — Progress Notes (Signed)
 "  BP 122/80   Pulse 92   Resp 16   Ht 5' 6 (1.676 m)   Wt 248 lb 14.4 oz (112.9 kg)   LMP 10/16/2021 (Exact Date) Comment: Seen by OB GYN on 10/16/21 for 6 days of excessive bleeding as a post menapause  SpO2 99%   BMI 40.17 kg/m    Subjective:    Patient ID: Alison [name], female    DOB: May 05, 1976, 48 y.o.   MRN: [id number]  HPI: Alison [name] is a 48 y.o. female  Chief Complaint  Patient presents with   Labs Only    To get labs done for lupus    Discussed the use of AI scribe software for clinical note transcription with the patient, who gave verbal consent to proceed.  History of Present Illness Alison [name] is a 48 year old female with lupus who presents for lab work to check for a lupus flare-up.  She is experiencing pleurisy and is currently taking prednisone . She is interested in having an ANA test run to determine if her lupus is flaring up.  She has a history of diabetes with blood sugar levels in the mid-200s. She is currently out of her basal insulin , Basaglar , which she was taking 40 units at bedtime. She also uses a Novolog  sliding scale for meals but has lost the instructions. She takes glipizide  10 mg in the morning and at bedtime, and metformin  1000 mg twice daily.  She has a history of hypertension and hyperlipidemia, for which she takes rosuvastatin  10 mg daily.  She has a history of lymphoma and is followed by oncology. She also has Sjogren's syndrome and a history of a liver transplant, for which she is followed by the transplant team at [facility]. Her bilirubin was recently elevated for the first time.  She is followed by nephrology for chronic kidney disease and microalbuminuria, and by neurology for migraines, fibromyalgia, neuroleptic-induced parkinsonism, and trigeminal neuralgia. She takes baclofen  10 mg twice daily as needed for pain, Cogentin  1 mg daily as needed for tremors, Lyrica  200 mg twice daily, and Ubrelvy  50 mg as needed.  She is also  seen by psychiatry and takes Latuda  20 mg daily, Remeron  15 mg daily, and Effexor  37.5 mg and 75 mg daily.  She was recently in the ER for pain and pleurisy and saw cardiology this morning. She is also followed by endocrinology and last saw her on November 17, 2023, but missed a follow-up appointment two weeks later. She reports she has an appointment Monday.         04/01/2024   10:09 AM 01/05/2024    9:50 AM 12/29/2023    2:18 PM  Depression screen PHQ 2/9  Decreased Interest 2 0 0  Down, Depressed, Hopeless 2 0 1  PHQ - 2 Score 4 0 1  Altered sleeping 2 0   Tired, decreased energy 2 0   Change in appetite 0 0   Feeling bad or failure about yourself  2 0   Trouble concentrating 1 0   Moving slowly or fidgety/restless 0 0   Suicidal thoughts 0 0   PHQ-9 Score 11 0   Difficult doing work/chores Very difficult Not difficult at all     Relevant past medical, surgical, family and social history reviewed and updated as indicated. Interim medical history since our last visit reviewed. Allergies and medications reviewed and updated.  Review of Systems  Ten systems reviewed and is negative except as mentioned  in HPI      Objective:     BP 122/80   Pulse 92   Resp 16   Ht 5' 6 (1.676 m)   Wt 248 lb 14.4 oz (112.9 kg)   LMP 10/16/2021 (Exact Date) Comment: Seen by OB GYN on 10/16/21 for 6 days of excessive bleeding as a post menapause  SpO2 99%   BMI 40.17 kg/m    Wt Readings from Last 3 Encounters:  04/01/24 248 lb 14.4 oz (112.9 kg)  04/01/24 249 lb 9.6 oz (113.2 kg)  03/29/24 250 lb (113.4 kg)    Physical Exam Physical Exam GENERAL: Alert, cooperative, well developed, no acute distress HEENT: Normocephalic, normal oropharynx, moist mucous membranes CHEST: Clear to auscultation bilaterally, No wheezes, rhonchi, or crackles CARDIOVASCULAR: Normal heart rate and rhythm, S1 and S2 normal without murmurs ABDOMEN: Soft, non-tender, non-distended, without organomegaly,  Normal bowel sounds EXTREMITIES: No cyanosis or edema NEUROLOGICAL: Cranial nerves grossly intact, Moves all extremities without gross motor or sensory deficit   Results for orders placed or performed during the hospital encounter of 03/29/24  Basic metabolic panel   Collection Time: 03/29/24  5:20 AM  Result Value Ref Range   Sodium 135 135 - 145 mmol/L   Potassium 5.1 3.5 - 5.1 mmol/L   Chloride 102 98 - 111 mmol/L   CO2 20 (L) 22 - 32 mmol/L   Glucose, Bld 231 (H) 70 - 99 mg/dL   BUN 20 6 - 20 mg/dL   Creatinine, Ser 8.91 (H) 0.44 - 1.00 mg/dL   Calcium  9.3 8.9 - 10.3 mg/dL   GFR, Estimated >39 >39 mL/min   Anion gap 13 5 - 15  CBC   Collection Time: 03/29/24  5:20 AM  Result Value Ref Range   WBC 11.0 (H) 4.0 - 10.5 K/uL   RBC 4.70 3.87 - 5.11 MIL/uL   Hemoglobin 13.4 12.0 - 15.0 g/dL   HCT 58.7 63.9 - 53.9 %   MCV 87.7 80.0 - 100.0 fL   MCH 28.5 26.0 - 34.0 pg   MCHC 32.5 30.0 - 36.0 g/dL   RDW 85.2 88.4 - 84.4 %   Platelets 209 150 - 400 K/uL   nRBC 0.0 0.0 - 0.2 %  Troponin I (High Sensitivity)   Collection Time: 03/29/24  5:20 AM  Result Value Ref Range   Troponin I (High Sensitivity) 7 <18 ng/L  Blood culture (routine x 2)   Collection Time: 03/29/24  7:37 AM   Specimen: BLOOD  Result Value Ref Range   Specimen Description BLOOD BLOOD LEFT WRIST    Special Requests      BOTTLES DRAWN AEROBIC AND ANAEROBIC Blood Culture results may not be optimal due to an inadequate volume of blood received in culture bottles   Culture      NO GROWTH 3 DAYS Performed at [facility], [address] [place], [place] [address]    Report Status PENDING   Blood culture (routine x 2)   Collection Time: 03/29/24  7:42 AM   Specimen: BLOOD  Result Value Ref Range   Specimen Description BLOOD ASSIST CONTROL    Special Requests      BOTTLES DRAWN AEROBIC AND ANAEROBIC Blood Culture adequate volume   Culture      NO GROWTH 3 DAYS Performed at [facility],  [address] [place], [place] [address]    Report Status PENDING   Resp panel by RT-PCR (RSV, Flu A&B, Covid) Anterior Nasal Swab  Collection Time: 03/29/24  7:44 AM   Specimen: Anterior Nasal Swab  Result Value Ref Range   SARS Coronavirus 2 by RT PCR NEGATIVE NEGATIVE   Influenza A by PCR NEGATIVE NEGATIVE   Influenza B by PCR NEGATIVE NEGATIVE   Resp Syncytial Virus by PCR NEGATIVE NEGATIVE  Lactic acid, plasma   Collection Time: 03/29/24  7:54 AM  Result Value Ref Range   Lactic Acid, Venous 2.6 (HH) 0.5 - 1.9 mmol/L  Hepatic function panel   Collection Time: 03/29/24  7:54 AM  Result Value Ref Range   Total Protein 6.4 (L) 6.5 - 8.1 g/dL   Albumin 3.1 (L) 3.5 - 5.0 g/dL   AST 22 15 - 41 U/L   ALT 14 0 - 44 U/L   Alkaline Phosphatase 68 38 - 126 U/L   Total Bilirubin 1.8 (H) 0.0 - 1.2 mg/dL   Bilirubin, Direct 0.5 (H) 0.0 - 0.2 mg/dL   Indirect Bilirubin 1.3 (H) 0.3 - 0.9 mg/dL  Lipase, blood   Collection Time: 03/29/24  7:54 AM  Result Value Ref Range   Lipase 37 11 - 51 U/L  Troponin I (High Sensitivity)   Collection Time: 03/29/24  7:54 AM  Result Value Ref Range   Troponin I (High Sensitivity) 7 <18 ng/L  Urine Culture   Collection Time: 03/29/24  9:41 AM   Specimen: Urine, Clean Catch  Result Value Ref Range   Specimen Description      URINE, CLEAN CATCH Performed at [facility], [address], [place], [place] [address]    Special Requests      Immunocompromised Performed at [facility], [address] [place], [place] [address]    Culture (A)     <10,000 COLONIES/mL INSIGNIFICANT GROWTH Performed at [facility], [address], [place], [place] [address]    Report Status 03/30/2024 FINAL   Lactic acid, plasma   Collection Time: 03/29/24  9:41 AM  Result Value Ref Range   Lactic Acid, Venous 2.7 (HH) 0.5 - 1.9 mmol/L  Urinalysis, Complete w Microscopic -Urine, Clean Catch   Collection Time: 03/29/24  9:41 AM   Result Value Ref Range   Color, Urine YELLOW (A) YELLOW   APPearance CLEAR (A) CLEAR   Specific Gravity, Urine 1.019 1.005 - 1.030   pH 5.0 5.0 - 8.0   Glucose, UA 150 (A) NEGATIVE mg/dL   Hgb urine dipstick NEGATIVE NEGATIVE   Bilirubin Urine NEGATIVE NEGATIVE   Ketones, ur NEGATIVE NEGATIVE mg/dL   Protein, ur 899 (A) NEGATIVE mg/dL   Nitrite NEGATIVE NEGATIVE   Leukocytes,Ua NEGATIVE NEGATIVE   RBC / HPF 0-5 0 - 5 RBC/hpf   WBC, UA 6-10 0 - 5 WBC/hpf   Bacteria, UA RARE (A) NONE SEEN   Squamous Epithelial / HPF 0-5 0 - 5 /HPF   Mucus PRESENT    Hyaline Casts, UA PRESENT   Lactic acid, plasma   Collection Time: 03/29/24 11:43 AM  Result Value Ref Range   Lactic Acid, Venous 1.8 0.5 - 1.9 mmol/L          Assessment & Plan:   Problem List Items Addressed This Visit       Cardiovascular and Mediastinum   Essential hypertension - Primary   Relevant Orders   CBC with Differential/Platelet   Comprehensive metabolic panel with GFR   Migraine     Endocrine   Insulin  dependent type 2 diabetes mellitus (HCC)   Relevant Medications  Insulin  Glargine (BASAGLAR  KWIKPEN) 100 UNIT/ML   Other Relevant Orders   Comprehensive metabolic panel with GFR   Hemoglobin A1c   HM Diabetes Foot Exam (Completed)     Nervous and Auditory   Trigeminal neuralgia   Neuroleptic induced parkinsonism (HCC)     Genitourinary   CKD (chronic kidney disease), stage II   Relevant Orders   Comprehensive metabolic panel with GFR   Overactive bladder     Other   Fibromyalgia   Systemic lupus erythematosus (SLE) in adult [facility])   Relevant Orders   Analyzer ANA IFA w/RFLX Titer/Pattern,Systemic Autoimmune Panel 1   Sed Rate (ESR)   C-reactive protein   Hyperlipidemia   Relevant Orders   Lipid panel   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HCC)   Morbid obesity (HCC)   Relevant Medications   Insulin  Glargine (BASAGLAR  KWIKPEN) 100 UNIT/ML   S/P liver transplant (HCC)   Sjogren's  syndrome (HCC)   Relevant Orders   Analyzer ANA IFA w/RFLX Titer/Pattern,Systemic Autoimmune Panel 1   Sed Rate (ESR)   C-reactive protein   RESOLVED: Obesity (BMI 35.0-39.9 without comorbidity)   Relevant Medications   Insulin  Glargine (BASAGLAR  KWIKPEN) 100 UNIT/ML   Other Relevant Orders   Thyroid  Panel With TSH     Assessment and Plan Assessment & Plan Pleurisy Pleurisy is present, possibly related to lupus flare. Differential diagnosis includes pericarditis. - Order ANA test to assess lupus activity, sed rate and crp - Discuss potential switch from prednisone  to colchicine  if pericarditis is confirmed  Systemic lupus erythematosus (lupus) Lupus may be flaring, as indicated by pleurisy and recent symptoms. She is concerned about lupus activity. - Order ANA test to assess lupus activity - Discuss potential treatment adjustments based on lab results  Type 2 diabetes mellitus Blood sugar levels are elevated, with readings in the mid-200s. She is currently out of basal insulin  and not using sliding scale insulin  due to loss of instructions. - Send prescription for Basaglar  40 units to [facility] - Print sliding scale insulin  instructions for her - Ensure she receives insulin  before next appointment  Ckd -managed by nephrology -recommend better glycemic control  HLD -rosuvastatin  10 mg daily -check lipid panel  Htn -stable  Migraine/trigeminal neuralgia -stable continue ubrelvy  as needed -follow up with neurology  Fibromyalgia -managed by neurology baclofen  10 mg twice daily as needed for pain, Lyrica  200 mg twice daily  Bipolar -managed by psychiatry Cogentin  1 mg daily as needed for tremors Effexor  daily, remeron  15 mg at bedtime, latuda  20 mg daily    Hx of liver transplant -has appointment with transplant team next month -recent bilirubin number are up, will recheck  Follow up plan: Return in about 6 months (around 10/01/2024) for follow up.      "

## 2024-04-01 ENCOUNTER — Other Ambulatory Visit: Payer: Self-pay

## 2024-04-01 ENCOUNTER — Ambulatory Visit: Admitting: Nurse Practitioner

## 2024-04-01 ENCOUNTER — Encounter: Payer: Self-pay | Admitting: Nurse Practitioner

## 2024-04-01 ENCOUNTER — Ambulatory Visit: Attending: Nurse Practitioner | Admitting: Nurse Practitioner

## 2024-04-01 VITALS — BP 124/74 | HR 79 | Ht 66.0 in | Wt 249.6 lb

## 2024-04-01 VITALS — BP 122/80 | HR 92 | Resp 16 | Ht 66.0 in | Wt 248.9 lb

## 2024-04-01 DIAGNOSIS — G2111 Neuroleptic induced parkinsonism: Secondary | ICD-10-CM | POA: Diagnosis not present

## 2024-04-01 DIAGNOSIS — E669 Obesity, unspecified: Secondary | ICD-10-CM | POA: Diagnosis not present

## 2024-04-01 DIAGNOSIS — M3500 Sicca syndrome, unspecified: Secondary | ICD-10-CM | POA: Diagnosis not present

## 2024-04-01 DIAGNOSIS — Z794 Long term (current) use of insulin: Secondary | ICD-10-CM

## 2024-04-01 DIAGNOSIS — Z944 Liver transplant status: Secondary | ICD-10-CM

## 2024-04-01 DIAGNOSIS — I10 Essential (primary) hypertension: Secondary | ICD-10-CM | POA: Diagnosis not present

## 2024-04-01 DIAGNOSIS — E119 Type 2 diabetes mellitus without complications: Secondary | ICD-10-CM

## 2024-04-01 DIAGNOSIS — Z8679 Personal history of other diseases of the circulatory system: Secondary | ICD-10-CM | POA: Diagnosis not present

## 2024-04-01 DIAGNOSIS — R9431 Abnormal electrocardiogram [ECG] [EKG]: Secondary | ICD-10-CM | POA: Diagnosis not present

## 2024-04-01 DIAGNOSIS — M329 Systemic lupus erythematosus, unspecified: Secondary | ICD-10-CM

## 2024-04-01 DIAGNOSIS — C8330 Diffuse large B-cell lymphoma, unspecified site: Secondary | ICD-10-CM

## 2024-04-01 DIAGNOSIS — G500 Trigeminal neuralgia: Secondary | ICD-10-CM

## 2024-04-01 DIAGNOSIS — R002 Palpitations: Secondary | ICD-10-CM | POA: Diagnosis not present

## 2024-04-01 DIAGNOSIS — G4733 Obstructive sleep apnea (adult) (pediatric): Secondary | ICD-10-CM | POA: Diagnosis not present

## 2024-04-01 DIAGNOSIS — M797 Fibromyalgia: Secondary | ICD-10-CM

## 2024-04-01 DIAGNOSIS — E782 Mixed hyperlipidemia: Secondary | ICD-10-CM | POA: Diagnosis not present

## 2024-04-01 DIAGNOSIS — G43809 Other migraine, not intractable, without status migrainosus: Secondary | ICD-10-CM

## 2024-04-01 DIAGNOSIS — N182 Chronic kidney disease, stage 2 (mild): Secondary | ICD-10-CM | POA: Diagnosis not present

## 2024-04-01 DIAGNOSIS — R0781 Pleurodynia: Secondary | ICD-10-CM | POA: Diagnosis not present

## 2024-04-01 DIAGNOSIS — E785 Hyperlipidemia, unspecified: Secondary | ICD-10-CM | POA: Diagnosis not present

## 2024-04-01 DIAGNOSIS — N3281 Overactive bladder: Secondary | ICD-10-CM

## 2024-04-01 MED ORDER — BASAGLAR KWIKPEN 100 UNIT/ML ~~LOC~~ SOPN: 40.0000 [IU] | PEN_INJECTOR | Freq: Every day | SUBCUTANEOUS | 0 refills | Status: DC

## 2024-04-01 NOTE — Patient Instructions (Signed)
 Visit Information  Alison [name] was given information about Medicaid Managed Care team care coordination services as a part of their Healthy [facility] Medicaid benefit. Alison [name] verbally consented to engagement with the [facility] Managed Care team.   If you are experiencing a medical emergency, please call 911 or report to your local emergency department or urgent care.   If you have a non-emergency medical problem during routine business hours, please contact your provider's office and ask to speak with a nurse.   For questions related to your Healthy [facility] health plan, please call: [phone] or visit the homepage here: [email or web address]  If you would like to schedule transportation through your Healthy [facility] plan, please call the following number at least 2 days in advance of your appointment: [phone]  For information about your ride after you set it up, call Ride Assist at [phone]. Use this number to activate a Will Call pickup, or if your transportation is late for a scheduled pickup. Use this number, too, if you need to make a change or cancel a previously scheduled reservation.  If you need transportation services right away, call [phone]. The after-hours call center is staffed 24 hours to handle ride assistance and urgent reservation requests (including discharges) 365 days a year. Urgent trips include sick visits, hospital discharge requests and life-sustaining treatment.  Call the [facility] Line at [phone], at any time, 24 hours a day, 7 days a week. If you are in danger or need immediate medical attention call 911.  If you would like help to quit smoking, call 1-800-QUIT-NOW (512 473 7960) OR Espaol: 1-855-Djelo-Ya (8-144-664-6430) o para ms informacin haga clic aqu or Text READY to 799-599 to register via text  Alison [name] - following are the goals we discussed in your visit today:    Goals Addressed             This Visit's Progress    VBCI RN Care Plan - DM Insulin  Dependent       Problems:  Chronic Disease Management support and education needs related to DMII  Goal: Over the next 90 days the Patient will attend all scheduled medical appointments: [facility] and specialist as evidenced by chart review, no missed appointments        continue to work with RN Care Manager and/or Social Worker to address care management and care coordination needs related to DMII as evidenced by adherence to care management team scheduled appointments     take all medications exactly as prescribed and will call provider for medication related questions as evidenced by verbalizing compliance with all medications, specifically use of basal and short acting insulin     verbalize basic understanding of DMII disease process and self health management plan as evidenced by taking insulin  and medications as prescribed, checking BG at least fasting and before meals, then as advised by Endo at appointment 04/05/24, calling provider with questions or concerns, attending Endocrine appointment and any recommended classes related to DM, lifestyle modifications   Interventions:   Diabetes Interventions: Assessed patient's understanding of A1c goal: <7% Provided education to patient about basic DM disease process Reviewed medications with patient and discussed importance of medication adherence Discussed plans with patient for ongoing care management follow up and provided patient with direct contact information for care management team Provided patient with written educational materials related to hypo and hyperglycemia and importance of correct treatment; Patient able to verbalized correct treatment of hypoglycemia Advised patient, providing education and rationale, to check cbg  Fasting and before meals and record, calling PCP for findings outside established parameters Screening for signs and symptoms of  depression related to chronic disease state  Assessed social determinant of health barriers Lab Results  Component Value Date   HGBA1C 11.8 (H) 11/13/2023    Patient Self-Care Activities:  Attend all scheduled provider appointments Call pharmacy for medication refills 3-7 days in advance of running out of medications Call provider office for new concerns or questions  Take medications as prescribed   check blood sugar at prescribed times: At least FBG and before meals then as directed by endo at appointment 6/30 enter blood sugar readings and medication or insulin  into daily log take the blood sugar log to all doctor visits  Plan:  Telephone follow up appointment with care management team member scheduled for:  04/16/2024 at 9:00 am             Please see education materials related to Diabetes and Advance Care Directives provided by MyChart link.  Patient verbalizes understanding of instructions and care plan provided today and agrees to view in MyChart. Active MyChart status and patient understanding of how to access instructions and care plan via MyChart confirmed with patient.     RN Care Manager will follow up by telephone on 04/16/2024 at 9:00 am  [name], MSN, RN [facility]  [facility], [facility] RN Care Manager Direct Dial: [phone] Fax: [phone]   Diabetes Action Plan A diabetes action plan is a way for you to manage your symptoms of diabetes, also called diabetes mellitus. The plan is color-coded to guide you on what actions to take based on any symptoms you're having. If you have symptoms in the red zone, you need medical care right away. If you have symptoms in the yellow zone, your diabetes isn't under control, and you may need to make some changes. If you have symptoms in the green zone, you're doing well. Understanding diabetes can take time. Follow the treatment plan that you created with your health care provider.  Know the target range for your blood sugar, also called glucose. Review your plan each time you visit your provider. The target range for my blood sugar level is __________________________ mg/dL. Red zone Get medical help right away if you have any of the following symptoms: A blood sugar test result that's below 54 mg/dL (3 mmol/L). A blood sugar test result that's at or above 240 mg/dL (86.6 mmol/L) for 2 days in a row along with: Extreme thirst and frequent peeing. Confusion or trouble thinking clearly. Moderate or large ketone levels in your pee (urine). Feeling tired or having no energy. Trouble breathing. Sickness or a fever for 2 or more days that's not getting better. These symptoms may be an emergency. Call 911 right away. Do not wait to see if the symptoms will go away. Do not drive yourself to the hospital. If you have very low blood sugar, also called severe hypoglycemia, and you can't eat or drink, you may need glucagon. Make sure a family member or close friend knows how to check your blood sugar and how to give you glucagon. You may need to be treated in a hospital for this condition. Yellow zone If you have any of the following symptoms, your diabetes isn't under control, and you may need to make some changes: A blood sugar test result that's at or above 240 mg/dL (86.6 mmol/L) for 2 days in a row. Blood sugar test results that are  below 70 mg/dL (3.9 mmol/L). Other symptoms of hypoglycemia, such as: Shaking or feeling light-headed. Confusion or irritability. Feeling hungry. Having a fast heartbeat. If you have any yellow zone symptoms: Treat your hypoglycemia by eating or drinking 15 grams of a rapid-acting carbohydrate. Follow the 15:15 rule: Take 15 grams of a rapid-acting carbohydrate, such as: 1 tube of glucose gel. 4 glucose pills. 4 oz (120 mL) of fruit juice. 4 oz (120 mL) of regular (not diet) soda. Check your blood sugar again 15 minutes after you take the  carbohydrate. If the second blood sugar test is still at or below 70 mg/dL (3.9 mmol/L), take 15 grams of a carbohydrate again. If your blood sugar doesn't increase above 70 mg/dL (3.9 mmol/L) after 3 tries, get medical help right away. After your blood sugar returns to normal, eat a meal or a snack within 1 hour. Keep taking your daily medicines as told by your provider. Check your blood sugar more often than you normally would. Write down your results. Call your provider if you have trouble keeping your blood sugar in your target range. Green zone These signs mean you're doing well and can continue what you're doing to manage your diabetes: Your blood sugar is within your personal target range. For most people, a blood sugar level before a meal should be 80-130 mg/dL (4.4-7.2 mmol/L). You feel well, and you're able to do daily activities. If you're in the green zone, continue to manage your diabetes as told by your provider. To do this: Eat a healthy diet. Exercise regularly. Check your blood sugar as told. Take your medicines only as told. Where to find more information American Diabetes Association (ADA): diabetes.org Association of Diabetes Care & Education Specialists (ADCES): adces.org/diabetes-education-dsmes This information is not intended to replace advice given to you by your health care provider. Make sure you discuss any questions you have with your health care provider. Document Revised: 05/14/2023 Document Reviewed: 05/14/2023 Elsevier Patient Education  2024 Elsevier Inc.  Diabetes Mellitus and Nutrition, Adult When you have diabetes, or diabetes mellitus, it is very important to have healthy eating habits because your blood sugar (glucose) levels are greatly affected by what you eat and drink. Eating healthy foods in the right amounts, at about the same times every day, can help you: Manage your blood glucose. Lower your risk of heart disease. Improve your blood  pressure. Reach or maintain a healthy weight. What can affect my meal plan? Every person with diabetes is different, and each person has different needs for a meal plan. Your health care provider may recommend that you work with a dietitian to make a meal plan that is best for you. Your meal plan may vary depending on factors such as: The calories you need. The medicines you take. Your weight. Your blood glucose, blood pressure, and cholesterol levels. Your activity level. Other health conditions you have, such as heart or kidney disease. How do carbohydrates affect me? Carbohydrates, also called carbs, affect your blood glucose level more than any other type of food. Eating carbs raises the amount of glucose in your blood. It is important to know how many carbs you can safely have in each meal. This is different for every person. Your dietitian can help you calculate how many carbs you should have at each meal and for each snack. How does alcohol affect me? Alcohol can cause a decrease in blood glucose (hypoglycemia), especially if you use insulin  or take certain diabetes medicines by mouth. Hypoglycemia  can be a life-threatening condition. Symptoms of hypoglycemia, such as sleepiness, dizziness, and confusion, are similar to symptoms of having too much alcohol. Do not drink alcohol if: Your health care provider tells you not to drink. You are pregnant, may be pregnant, or are planning to become pregnant. If you drink alcohol: Limit how much you have to: 0-1 drink a day for women. 0-2 drinks a day for men. Know how much alcohol is in your drink. In the [place] one drink equals one 12 oz bottle of beer (355 mL), one 5 oz glass of wine (148 mL), or one 1 oz glass of hard liquor (44 mL). Keep yourself hydrated with water, diet soda, or unsweetened iced tea. Keep in mind that regular soda, juice, and other mixers may contain a lot of sugar and must be counted as carbs. What are tips for following  this plan?  Reading food labels Start by checking the serving size on the Nutrition Facts label of packaged foods and drinks. The number of calories and the amount of carbs, fats, and other nutrients listed on the label are based on one serving of the item. Many items contain more than one serving per package. Check the total grams (g) of carbs in one serving. Check the number of grams of saturated fats and trans fats in one serving. Choose foods that have a low amount or none of these fats. Check the number of milligrams (mg) of salt (sodium) in one serving. Most people should limit total sodium intake to less than 2,300 mg per day. Always check the nutrition information of foods labeled as low-fat or nonfat. These foods may be higher in added sugar or refined carbs and should be avoided. Talk to your dietitian to identify your daily goals for nutrients listed on the label. Shopping Avoid buying canned, pre-made, or processed foods. These foods tend to be high in fat, sodium, and added sugar. Shop around the outside edge of the grocery store. This is where you will most often find fresh fruits and vegetables, bulk grains, fresh meats, and fresh dairy products. Cooking Use low-heat cooking methods, such as baking, instead of high-heat cooking methods, such as deep frying. Cook using healthy oils, such as olive, canola, or sunflower oil. Avoid cooking with butter, cream, or high-fat meats. Meal planning Eat meals and snacks regularly, preferably at the same times every day. Avoid going long periods of time without eating. Eat foods that are high in fiber, such as fresh fruits, vegetables, beans, and whole grains. Eat 4-6 oz (112-168 g) of lean protein each day, such as lean meat, chicken, fish, eggs, or tofu. One ounce (oz) (28 g) of lean protein is equal to: 1 oz (28 g) of meat, chicken, or fish. 1 egg.  cup (62 g) of tofu. Eat some foods each day that contain healthy fats, such as  avocado, nuts, seeds, and fish. What foods should I eat? Fruits Berries. Apples. Oranges. Peaches. Apricots. Plums. Grapes. Mangoes. Papayas. Pomegranates. Kiwi. Cherries. Vegetables Leafy greens, including lettuce, spinach, kale, chard, collard greens, mustard greens, and cabbage. Beets. Cauliflower. Broccoli. Carrots. Green beans. Tomatoes. Peppers. Onions. Cucumbers. Brussels sprouts. Grains Whole grains, such as whole-wheat or whole-grain bread, crackers, tortillas, cereal, and pasta. Unsweetened oatmeal. Quinoa. Brown or wild rice. Meats and other proteins Seafood. Poultry without skin. Lean cuts of poultry and beef. Tofu. Nuts. Seeds. Dairy Low-fat or fat-free dairy products such as milk, yogurt, and cheese. The items listed above may not be a complete  list of foods and beverages you can eat and drink. Contact a dietitian for more information. What foods should I avoid? Fruits Fruits canned with syrup. Vegetables Canned vegetables. Frozen vegetables with butter or cream sauce. Grains Refined white flour and flour products such as bread, pasta, snack foods, and cereals. Avoid all processed foods. Meats and other proteins Fatty cuts of meat. Poultry with skin. Breaded or fried meats. Processed meat. Avoid saturated fats. Dairy Full-fat yogurt, cheese, or milk. Beverages Sweetened drinks, such as soda or iced tea. The items listed above may not be a complete list of foods and beverages you should avoid. Contact a dietitian for more information. Questions to ask a health care provider Do I need to meet with a certified diabetes care and education specialist? Do I need to meet with a dietitian? What number can I call if I have questions? When are the best times to check my blood glucose? Where to find more information: American Diabetes Association: diabetes.org Academy of Nutrition and Dietetics: eatright.[employer] of Diabetes and Digestive and Kidney Diseases:  StageSync.si Association of Diabetes Care & Education Specialists: diabeteseducator.org Summary It is important to have healthy eating habits because your blood sugar (glucose) levels are greatly affected by what you eat and drink. It is important to use alcohol carefully. A healthy meal plan will help you manage your blood glucose and lower your risk of heart disease. Your health care provider may recommend that you work with a dietitian to make a meal plan that is best for you. This information is not intended to replace advice given to you by your health care provider. Make sure you discuss any questions you have with your health care provider. Document Revised: 04/25/2020 Document Reviewed: 04/26/2020 Elsevier Patient Education  2024 Elsevier Inc.   Preventing Diabetes Mellitus Complications You can help to prevent or slow down problems that are caused by diabetes (diabetes mellitus). If you follow your diabetes plan and take care of yourself, you can lower your chances of having severe problems. What actions can I take to prevent problems caused by diabetes? Managing your diabetes  Follow instructions from your health care providers about how to manage your diabetes. You may have a team of health care providers. They can teach you how to care for yourself and can answer any questions you have. Learn about your condition. This can help you make healthy choices when it comes to eating and physical activity. Know your target range for your blood sugar (glucose). Check your blood glucose levels. Your health care provider will help you decide how often to check your levels. How often you check may depend on your goals for treatment and how well you are meeting them. Ask your health care provider if you should take a low dose of aspirin every day. Ask what dose is best for you. Taking a low dose of aspirin can help prevent heart disease. Controlling your blood pressure and cholesterol Your  target blood pressure is based on: Your age. Your medicines. How long you have had diabetes. Other conditions you have. Controlling your cholesterol may: Help prevent heart disease and stroke. Improve your blood flow. To control your blood pressure and cholesterol: Follow instructions from your health care provider about meal planning, exercise, and medicines. Make sure your health care provider checks your blood pressure at every visit. Monitor your blood pressure at home as told by your health care provider. Have your cholesterol checked at least once a year. A medicine  called statin can help to lower your cholesterol. Ask your health care provider if you are or should be taking a statin.  Medical appointments and vaccines Have yearly physical exams and eye exams. Your health care providers will tell you how often you need to see them. It may depend on your diabetes plan. Every visit with a health care provider should include a measure of: Your weight. Your blood pressure. Your blood glucose. Your A1C level should be checked: At least 2 times a year, if you are meeting your treatment goals. 4 times a year, if you are not meeting treatment goals or if your goals have changed. Your blood lipids (lipid profile) should be checked once a year. You should also be checked once a year for protein in your urine (urine microalbumin). If you have type 1 diabetes, get an eye exam within 5 years after you are diagnosed. Get an exam once a year after that first exam. If you have type 2 diabetes, get an eye exam as soon as you are diagnosed. Get an exam once a year after that first exam. Keep your vaccines current. You should get: A flu vaccine every year. A pneumonia vaccine and a hepatitis B vaccine. If you are 65 years or older, you may get the pneumonia vaccine as a series of two shots. Ask your health care provider what other vaccines you may need to get. Keep all follow-up visits. This can  help ensure that problems can be avoided or found early and treated. Lifestyle Do not use any products that contain nicotine or tobacco. These products include cigarettes, chewing tobacco, and vaping devices, such as e-cigarettes. If you need help quitting, ask your health care provider. If you quit smoking, you may: Lower your risk for heart attack, stroke, nerve disease, and kidney disease. Help your blood move through your body better. Help your blood pressure and cholesterol levels. Do not drink alcohol if: Your health care provider tells you not to drink. You are pregnant, may be pregnant, or are planning to become pregnant. If you drink alcohol: Limit how much you have to: 0-1 drink a day for women. 0-2 drinks a day for men. Know how much alcohol is in your drink. In the [place] one drink equals one 12 oz bottle of beer (355 mL), one 5 oz glass of wine (148 mL), or one 1 oz glass of hard liquor (44 mL). Taking care of your feet Diabetes may cause you to have poor blood flow to your legs and feet. It can also cause: The skin on your feet to get thinner, break more easily, and heal more slowly. Nerve damage in your legs and feet. This can result in less feeling. You may not notice small injuries. This could lead to bigger problems. To avoid foot problems: Check your skin and feet every day for cuts, bruises, redness, blisters, or sores. Have a foot exam with your health care provider once a year. During the exam, your health care provider will: Look at the structure and skin of your feet. Check your pulses and amount of feeling in your feet. Make sure that your health care provider does a visual foot exam at every visit.  Taking care of your teeth People who have diabetes that is not controlled well are more likely to have gum disease (periodontal disease). Diabetes can make gum disease harder to control. If not treated, it can lead to tooth loss. To prevent this: Brush your teeth  twice a  day. Floss at least once a day. Visit your dentist 2 times a year. Managing stress Living with diabetes can be stressful. When you are stressed, you may: Have higher blood glucose because of stress hormones. Be distracted from taking good care of yourself. Be aware of your stress level and make changes to help you manage tough times. To lower your stress levels: Think about joining a support group. Do planned relaxation or meditation. Do a hobby that you enjoy. Maintain healthy relationships. Try to exercise every day. Work with your health care provider or a mental health professional. Where to find more information American Diabetes Association: diabetes.org Association of Diabetes Care & Education Specialists: diabeteseducator.org This information is not intended to replace advice given to you by your health care provider. Make sure you discuss any questions you have with your health care provider. Document Revised: 03/27/2022 Document Reviewed: 03/27/2022 Elsevier Patient Education  2024 Elsevier Inc.  Type 2 Diabetes Mellitus, Self-Care, Adult When you have type 2 diabetes (type 2 diabetes mellitus), you must make sure your blood sugar (glucose) stays in a healthy range. You can do this with: Nutrition. Exercise. Lifestyle changes. Medicines or insulin , if needed. Support from your doctors and others. What are the risks? Having type 2 diabetes can raise your risk for other long-term (chronic) health problems. You may get medicines to help prevent these problems. How to stay aware of your blood sugar  Check your blood sugar level every day, as often as told. Have your A1C (hemoglobin A1C) level checked two or more times a year. Have it checked more often if told. Your doctor will set personal treatment goals for you. In general, you should have these blood sugar levels: Before meals: 80-130 mg/dL (4.4-7.2 mmol/L). After meals: below 180 mg/dL (10 mmol/L). A1C: less  than 7%. How to manage high and low blood sugar Symptoms of high blood sugar High blood sugar is also called hyperglycemia. Know the symptoms of high blood sugar. These may include: More thirst. Hunger. Feeling very tired. Needing to pee (urinate) more often than normal. Seeing things blurry. Symptoms of low blood sugar Low blood sugar is also called hypoglycemia. This is when blood sugar is at or below 70 mg/dL (3.9 mmol/L). Symptoms may include: Hunger. Feeling worried or nervous (anxious). Feeling sweaty and cold to the touch (clammy). Being dizzy or light-headed. Feeling sleepy. A fast heartbeat. Feeling grouchy (irritable). Tingling or loss of feeling (numbness) around your mouth, lips, or tongue. Restless sleep. Diabetes medicines can cause low blood sugar. You are more at risk: While you exercise. After exercise. During sleep. When you are sick. When you skip meals or do not eat for a long time. Treating low blood sugar If you think you have low blood sugar, eat or drink something sugary right away. Keep 15 grams of a fast-acting carb (carbohydrate) with you all the time. Make sure your family and friends know how to treat you if you cannot treat yourself. Treating very low blood sugar Severe hypoglycemia is when your blood sugar is at or below 54 mg/dL (3 mmol/L). Severe hypoglycemia is an emergency. Get medical help right away. Call your local emergency services (911 in the [place] Do not wait to see if the symptoms will go away. Do not drive yourself to the hospital. You may need a glucagon shot if you have very low blood sugar and you cannot eat or drink. Have a family member or friend learn how to check your blood sugar and  how to give you a glucagon shot. Ask your doctor if you should have a kit for glucagon shots. Follow these instructions at home: Medicines Take prescribed insulin  or diabetes medicines as told by your health care provider. Do not run out of insulin   or other medicines. Plan ahead. If you use insulin , change the amount you take based on how active you are and what foods you eat. Your doctor will tell you how to do this. Take over-the-counter and prescription medicines only as told by your doctor. Eating and drinking  Eat healthy foods. These include: Low-fat (lean) proteins. Complex carbs, such as whole grains. Fresh fruits and vegetables. Low-fat dairy products. Healthy fats. Meet with a food expert (dietitian) to make an eating plan. Follow instructions from your doctor about what you cannot eat or drink. Drink enough fluid to keep your pee (urine) pale yellow. Keep track of carbs that you eat. Read food labels and learn serving sizes of foods. Follow your sick-day plan when you cannot eat or drink as normal. Make this plan with your doctor so it is ready to use. Activity Exercise as told by your doctor. You may need to: Do stretching and strength exercises two or more times a week. Do 150 minutes or more of exercise each week that makes your heart beat faster and makes you sweat. Spread out your exercise over 3 or more days a week. Do not go more than 2 days in a row without exercise. Talk with your doctor before you start a new exercise. Your doctor may tell you to change: How much insulin  or medicines you take. How much food you eat. Lifestyle Do not smoke or use any products that contain nicotine or tobacco. If you need help quitting, ask your doctor. If you drink alcohol and your doctor says that it is safe for you: Limit how much you have to: 0-1 drink a day for women who are not pregnant. 0-2 drinks a day for men. Know how much alcohol is in your drink. In the [place] one drink equals one 12 oz bottle of beer (355 mL), one 5 oz glass of wine (148 mL), or one 1 oz glass of hard liquor (44 mL). Learn to deal with stress. If you need help, ask your doctor. Body care  Stay up to date with your shots (immunizations). Have  your eyes and feet checked by a doctor as often as told. Check your skin and feet every day. Check for cuts, bruises, redness, blisters, or sores. Brush your teeth and gums two times a day. Floss one or more times a day. Go to the dentist one or more times every 6 months. Stay at a healthy weight. General instructions Share your diabetes care plan with: Your work or school. People you live with. Carry a card or wear jewelry that says you have diabetes. Keep all follow-up visits. Questions to ask your doctor Do I need to meet with a certified expert in diabetes education and care? Where can I find a support group? Where to find more information For help and guidance and more information about diabetes, please go to: American Diabetes Association: www.diabetes.org American Association of Diabetes Care and Education Specialists: www.diabeteseducator.org International Diabetes Federation: DCOnly.dk Summary When you have type 2 diabetes, you must make sure your blood sugar (glucose) stays in a healthy range. You can do this with nutrition, exercise, medicines and insulin , and support from doctors and others. Check your blood sugar every day, or as often  as told. Having diabetes can raise your risk for other long-term health problems. You may get medicines to help prevent these problems. Share your diabetes management plan with people at work, school, and home. Keep all follow-up visits. This information is not intended to replace advice given to you by your health care provider. Make sure you discuss any questions you have with your health care provider. Document Revised: 12/18/2020 Document Reviewed: 12/18/2020 Elsevier Patient Education  2024 [employer].    Advance Directive  Advance directives are legal papers that state your wishes about health care decisions. They let your wishes be known to family, friends, and health care providers if you become unable to speak for yourself.   You should write these papers out over time rather than all at once. They can be changed and updated at any time. The types of advance directives include: Medical power of attorney (POA). Living wills. Do not resuscitate (DNR) or do not attempt resuscitation (DNAR) orders. What are a health care proxy and medical POA? A health care proxy is also called a health care agent. It's a person you choose to make medical decisions for you when you can't make them for yourself. In most cases, a proxy is a trusted friend or family member. A medical POA is legal paperwork that names your proxy. It may need to be: Signed. Notarized. Dated. Copied. Witnessed. Added to your medical record. You may also want to choose someone to handle your money if you can't do so. This is called a durable POA for finances. It's separate from a medical POA. You may choose your health care proxy or someone else to act as your agent in money matters. If you don't have a proxy, or if the proxy may not be acting in your best interest, a court may choose a guardian to act on your behalf. What is a living will? A living will is legal paperwork that states your wishes about medical care. Providers should keep a copy of it in your medical record. You may want to give a copy to family members or friends. You can also keep a card in your wallet to let loved ones know you have a living will and where they can find it. A living will may be used if: You're very sick with something that will end your life. You become disabled. You can't make decisions or speak for yourself. Your living will should include whether: To use or not use life support equipment. This may include machines to filter your blood or to help you breathe. You want a DNR or DNAR order. This tells providers not to use CPR if your heart or breathing stops. To use or not use tube feeding. You want to be given foods and fluids. You want a type of comfort care called  palliative care. This may be given when the goal for treatment becomes comfort rather than a cure. You want to donate your organs and tissues. A living will doesn't say what to do with your money and property if you pass away. What is a DNR or DNAR? A DNR or DNAR order is a request not to have CPR. If you don't have one of these orders, a provider will try to help you if your heart stops or you stop breathing.  If you plan to have surgery, talk with your provider about your DNR or DNAR order. What happens if I don't have an advance directive? Each state has its own laws  about advance directives. Some states assign family decision makers to act on your behalf if you don't have an advance directive.  Check with your provider, attorney, or state representative about the laws in your state. Where to find more information Each state has its own laws about advance directives. You can look up these laws at: https://rodriguez-phillips.com/ This information is not intended to replace advice given to you by your health care provider. Make sure you discuss any questions you have with your health care provider. Document Revised: 02/10/2023 Document Reviewed: 02/10/2023 Elsevier Patient Education  2024 [employer].

## 2024-04-01 NOTE — Patient Outreach (Signed)
 Complex Care Management   Visit Note  04/01/2024  Name:  Alison [name] MRN: [id number] DOB: [birth year]  Situation: Referral received for Complex Care Management related to Diabetes with Complications I obtained verbal consent from Patient.  Visit completed with Patient  on the phone  Background:   Past Medical History:  Diagnosis Date   Abnormal uterine bleeding    Allergy    Anxiety    Arthritis    Bipolar disorder (manic depression) (HCC)    Cancer (HCC) Diffuse B-Cell Lymphoma   2015   Chest pain    a. 09/2021 MV: Low risk without ischemia or infarct.  Normal EF.   Chronic kidney failure    Chronic renal disease, stage III (HCC)    Diabetes mellitus without complication (HCC)    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HCC) 2015   Right axillary lymph node resected and chemo tx's.   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 02/2023 Echo: EF 55-60%, no rwma, nl RV size/fxn. Mildly dil LA.   Personality disorder (HCC)    Pineal gland cyst    Post herpetic neuralgia    PTSD (post-traumatic stress disorder)    RBBB    Renal disorder    S/P liver transplant (HCC) 1993   Sleep apnea    has not recieved cpap yet-other one was recalled    Assessment: Patient Reported Symptoms:  Cognitive Cognitive Status: Alert and oriented to person, place, and time, Insightful and able to interpret abstract concepts, Normal speech and language skills   Health Maintenance Behaviors: Annual physical exam, Hobbies (volunteers) Healing Pattern: Slow Health Facilitated by: Pain  control, Rest  Neurological Neurological Review of Symptoms: Numbness (Neuopathy in hands and feet) Neurological Management Strategies: Medication therapy, Routine screening Neurological Comment: Lupus  HEENT HEENT Symptoms Reported: No symptoms reported      Cardiovascular Cardiovascular Symptoms Reported: Chest pain or discomfort (Pleurisy) Does patient have uncontrolled Hypertension?: No Cardiovascular Conditions: Hypertension (Pericarditis) Cardiovascular Management Strategies: Medication therapy Weight: 248 lb (112.5 kg) Cardiovascular Comment: states has lost weight over last couple months - difficulty cooking for self - grabs prepared food  Respiratory Respiratory Symptoms Reported: Shortness of breath Additional Respiratory Details: SOB when stands up    Endocrine Patient reports the following symptoms related to hypoglycemia or hyperglycemia : No symptoms reported Is patient diabetic?: Yes Is patient checking blood sugars at home?: Yes Endocrine Conditions: Diabetes Endocrine Management Strategies: Medication therapy, Routine screening Endocrine Self-Management Outcome: 2 (bad) Endocrine Comment: was not checking BG regularly, was not taking insulin   Gastrointestinal Gastrointestinal Symptoms Reported: No symptoms reported   Nutrition Risk Screen (CP): Difficulty chewing/swallowing, Unintentional loss of 10 lbs or more in the past 2 months (Difficulty preparing meals for self)  Genitourinary Genitourinary Symptoms Reported: No symptoms reported    Integumentary Integumentary Symptoms Reported: No symptoms reported    Musculoskeletal Musculoskelatal Symptoms Reviewed: Difficulty walking, Unsteady gait, Muscle pain,  Weakness Additional Musculoskeletal Details: muscle pain from fibromyalga Musculoskeletal Conditions: Fibromyalgia, Osteoarthritis Musculoskeletal Management Strategies: Activity, Adequate rest, Medical device Falls in the past year?: Yes Number of falls in past  year: 2 or more Was there an injury with Fall?: Yes Fall Risk Category Calculator: 3 Patient Fall Risk Level: High Fall Risk Patient at Risk for Falls Due to: History of fall(s), Impaired balance/gait, Impaired mobility Fall risk Follow up: Falls evaluation completed  Psychosocial Psychosocial Symptoms Reported: Anxiety - if selected complete GAD, Depression - if selected complete PHQ 2-9 Behavioral Health Conditions: Anxiety, Depression Behavioral Management Strategies: Counseling, Medication therapy, Support system Behavioral Health Self-Management Outcome: 4 (good) Behavioral Health Comment: feels controlled as best as it can be Major Change/Loss/Stressor/Fears (CP): Medical condition, self Techniques to Cope with Loss/Stress/Change: Diversional activities, Counseling, Medication Quality of Family Relationships: helpful, involved, supportive Do you feel physically threatened by others?: No      04/01/2024   10:09 AM  Depression screen PHQ 2/9  Decreased Interest 2  Down, Depressed, Hopeless 2  PHQ - 2 Score 4  Altered sleeping 2  Tired, decreased energy 2  Change in appetite 0  Feeling bad or failure about yourself  2  Trouble concentrating 1  Moving slowly or fidgety/restless 0  Suicidal thoughts 0  PHQ-9 Score 11  Difficult doing work/chores Very difficult    There were no vitals filed for this visit.  Medications Reviewed Today     Reviewed by Devra Lands, RN (Registered Nurse) on 04/01/24 at 1546  Med List Status: <None>   Medication Order Taking? Sig Documenting Provider Last Dose Status Informant  Accu-Chek Softclix Lancets lancets [id number] Yes 2 (two) times daily. [provider]  Active Self, Pharmacy Records  albuterol  (VENTOLIN  HFA) [phone] Base) MCG/ACT inhaler [id number] Yes Inhale 2 puffs into the lungs every 6 (six) hours as needed for wheezing or shortness of breath. Pender, Julie F, FNP  Active Self, Pharmacy Records           Med Note  [facility] [place] [facility]   Mon Mar 15, 2024 10:57 AM) PRN  baclofen  (LIORESAL ) 10 MG tablet [id number] Yes Take 1 tablet by mouth 2 (two) times daily. [provider]  Active Self, Pharmacy Records   Patient not taking:   Discontinued 04/01/24 1025 (Completed Course) benztropine  (COGENTIN ) 1 MG tablet [id number]  TAKE 1 TABLET BY MOUTH ONCE DAILY AS NEEDED FOR TREMORS  Patient not taking: Reported on 04/01/2024   [name], [name], MD  Active Self, Pharmacy Records   Patient not taking:   Discontinued 04/01/24 1025 Cranberry-Vitamin C-Probiotic (AZO CRANBERRY PO) [id number]  Take by mouth.  Patient not taking: Reported on 04/01/2024   [provider]  Active Self, Pharmacy Records  fluticasone  (FLONASE ) 50 MCG/ACT nasal spray [id number] Yes Place into the nose. [provider]  Active Self, Pharmacy Records  glipiZIDE  (GLUCOTROL  XL) 10 MG 24 hr tablet [id number] Yes TAKE 1 TABLET BY MOUTH IN THE MORNING AND AT BEDTIME Motwani, Komal, MD  Active   Insulin  Glargine (BASAGLAR  KWIKPEN) 100 UNIT/ML [id number] Yes Inject 40 Units into the skin at bedtime. Gareth Mliss FALCON, FNP  Active   Insulin  Pen Needle 32G X 6 MM MISC [id number] Yes 1 each by Does not apply route as needed. Gareth Mliss FALCON, FNP  Active Self, Pharmacy Records  lidocaine  (LIDODERM ) 5 % [id number]  Place 1 patch onto the skin daily for 10 days. Remove & Discard patch within 12 hours or as directed by MD  Patient not taking: Reported on 04/01/2024   Loyd Candida LULLA Aldona, PA-C  Active   lisinopril  (ZESTRIL ) 20 MG tablet [id number]  TAKE 1 TABLET BY MOUTH ONCE DAILY *PATIENT WILL NEED TO SCHEDULE AN APPOINTMENT FOR ADDITIONAL REFILLS* Gareth Mliss FALCON, FNP  Active Self, Pharmacy Records  lurasidone  (LATUDA ) 20 MG TABS tablet [id number] Yes TAKE 1 TABLET BY MOUTH ONCE DAILY WITH SUPPER *REFILL REQUEST* [name], [name], MD  Active Self, Pharmacy Records  magnesium  oxide (MAG-OX) 400 MG tablet [id number] Yes Take 1,000 mg by mouth 2  (two) times daily. [provider]  Active Self, Pharmacy Records  Melatonin 10 MG TABS [id number] Yes Take 10 mg by mouth at bedtime.  Patient taking differently: Take 10 mg by mouth as needed. As needed   [provider]  Active Self, Pharmacy Records  metFORMIN  (GLUCOPHAGE ) 1000 MG tablet [id number] Yes TAKE 1 TABLET BY MOUTH TWICE DAILY WITH MEALS *PATIENT WILL NEED TO SCHEDULE APPOINTMENT FOR ADDITIONAL REFILLS* Gareth Mliss FALCON, FNP  Active Self, Pharmacy Records  mirabegron  ER (MYRBETRIQ ) 50 MG TB24 tablet [id number] Yes Take 1 tablet (50 mg total) by mouth daily. Gareth Mliss FALCON, FNP  Active Self, Pharmacy Records  mirtazapine  (REMERON ) 15 MG tablet [id number] Yes TAKE 1 TABLET BY MOUTH EVERY DAY AT BEDTIME *REFILL REQUEST* [name], [name], MD  Active Self, Pharmacy Records  naloxone [facility]) nasal spray 4 mg/0.1 mL [id number] Yes Place 0.4 mg into the nose once. [provider]  Active Self, Pharmacy Records           Med Note GROVER, BURNARD S   Wed Oct 15, 2023  1:51 PM) prn  NOVOLOG  FLEXPEN 100 UNIT/ML FlexPen [id number] Yes Inject 0-30 Units into the skin 3 (three) times daily with meals. Sliding scale [provider]  Active Self, Pharmacy Records  ondansetron  (ZOFRAN ) 4 MG tablet [id number] Yes Take 1 tablet (4 mg total) by mouth every 6 (six) hours as needed for nausea. Caleen Qualia, MD  Active Self, Pharmacy Records  oxyCODONE  (OXY IR/ROXICODONE ) 5 MG immediate release tablet [id number] Yes Take 5 mg by mouth 3 (three) times daily as needed. [provider]  Active Self, Pharmacy Records  PRECISION QID TEST test strip [id number] Yes  [provider]  Active Self, Pharmacy Records  predniSONE  (DELTASONE ) 20 MG tablet [id number] Yes Take 2 tablets (40 mg total) by mouth daily with breakfast for 5 days. Menshew, Candida LULLA Aldona, PA-C  Active   pregabalin  (LYRICA ) 200 MG capsule [id number] Yes Take 1 capsule (200 mg total) by mouth 2 (two) times  daily. Floy Roberts, MD  Active Self, Pharmacy Records  rosuvastatin  (CRESTOR ) 10 MG tablet [id number] Yes Take 1 tablet (10 mg total) by mouth daily. Darliss Rogue, MD  Active Self, Pharmacy Records  Semaglutide ,0.25 or 0.5MG /DOS, 2 MG/3ML NELMA [id number] Yes Inject 0.25 mg into the skin once a week. Dartha Ernst, MD  Active Self, Pharmacy Records  tacrolimus  (PROGRAF ) 1 MG capsule [id number] Yes Take 1 capsule by mouth every morning. [provider]  Active Self, Pharmacy Records  TYLENOL  PM EXTRA STRENGTH 1000-50 MG/30ML LIQD [id number]  Take by mouth.  Patient not taking: Reported on 04/01/2024   [provider]  Active Self, Pharmacy Records  UBRELVY  50 MG TABS [id number] Yes Take 1 tablet by mouth daily as needed. [provider]  Active Self, Pharmacy Records  venlafaxine  XR (EFFEXOR -XR) 37.5 MG 24 hr capsule [id number] Yes Take 37.5 mg by mouth every morning. [provider]  Active  Self, Pharmacy Records  venlafaxine  XR (EFFEXOR -XR) 75 MG 24 hr capsule [id number] Yes Take 1 capsule (75 mg total) by mouth daily with breakfast. Take along with 37.5 mg daily [name], [name], MD  Active Self, Pharmacy Records            Recommendation:   PCP Follow-up - schedule FU after labs Specialty provider follow-up Endocrinology 04/05/24, Behavioral Health 04/28/2024, Nephrology 07/05/24, Neurology 06/10/2024 Continue Current Plan of Care  Follow Up Plan:   Telephone follow-up 2 Areon Cocuzza  [name], MSN, RN [facility]  [facility], [facility] RN Care Manager Direct Dial: [phone] Fax: [phone]

## 2024-04-01 NOTE — Patient Instructions (Signed)
Pre-meal insulin :  Check fsbs prior to meals  Hold if below 110 If glucose 110- 140 give 4 units If glucose 140 to 180 give 6 units  If glucose 180 to 220  give 8 units If glucose above 220 give 10 units

## 2024-04-01 NOTE — Patient Instructions (Signed)
 Medication Instructions:  Your physician recommends that you continue on your current medications as directed. Please refer to the Current Medication list given to you today.    *If you need a refill on your cardiac medications before your next appointment, please call your pharmacy*  Lab Work: No labs ordered today    Testing/Procedures: No test ordered today   Follow-Up: At [facility], you and your health needs are our priority.  As part of our continuing mission to provide you with exceptional heart care, our providers are all part of one team.  This team includes your primary Cardiologist (physician) and Advanced Practice Providers or APPs (Physician Assistants and Nurse Practitioners) who all work together to provide you with the care you need, when you need it.  Your next appointment:   1 month(s)  Provider:   You may see Redell Cave, MD or one of the following Advanced Practice Providers on your designated Care Team:   Lonni Meager, NP

## 2024-04-01 NOTE — Progress Notes (Signed)
 Office Visit    Patient Name: TIMBERLYN PICKFORD Date of Encounter: 04/01/2024  Primary Care Provider:  Gareth Mliss FALCON, FNP Primary Cardiologist:  Redell Cave, MD  Chief Complaint    48 y.o. 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   Past Medical History   Subjective   Past Medical History:  Diagnosis Date   Abnormal uterine bleeding    Allergy    Anxiety    Arthritis    Bipolar disorder (manic depression) (HCC)    Chest pain    a. 09/2021 MV: Low risk without ischemia or infarct.  Normal EF.   Chronic kidney failure    Chronic renal disease, stage III (HCC)    Diabetes mellitus without complication (HCC)    DLBCL (diffuse large B cell lymphoma) (HCC) 2015   Right axillary lymph node resected and chemo tx's.   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 02/2023 Echo: EF 55-60%, no rwma, nl RV size/fxn. Mildly dil LA.   Personality disorder (HCC)    Pineal gland cyst    Post herpetic neuralgia    PTSD (post-traumatic stress disorder)    RBBB    Renal disorder    S/P liver transplant (HCC) 1993   Sleep  apnea    has not recieved cpap yet-other one was recalled   Past Surgical History:  Procedure Laterality Date   BONE MARROW BIOPSY  01/13/2015   BREAST BIOPSY Right 12/2014   US  Axilla Bx, Positive for Lymphoma   BREAST BIOPSY Right 2011   benign   BREAST BIOPSY Right 2023   Stereo bx, X-clip, Fat Necrosis   BREAST BIOPSY Right 01/20/2024   US  RT BREAST BX W LOC DEV 1ST LESION IMG BX SPEC US  GUIDE 01/20/2024 [facility]   BREAST SURGERY Right 2016   Lymphoma, lymph node removal. pt then got a seroma and had to do more surgery   CHOLECYSTECTOMY     COLONOSCOPY     COLONOSCOPY WITH PROPOFOL  N/A 02/28/2022   Procedure: COLONOSCOPY WITH PROPOFOL ;  Surgeon: Jinny Carmine, MD;  Location: [facility] ENDOSCOPY;  Service: Endoscopy;  Laterality: N/A;   ESOPHAGOGASTRODUODENOSCOPY     ESOPHAGOGASTRODUODENOSCOPY (EGD) WITH PROPOFOL  N/A 01/09/2021   Procedure: ESOPHAGOGASTRODUODENOSCOPY (EGD) WITH PROPOFOL ;  Surgeon: Jinny Carmine, MD;  Location: [facility] ENDOSCOPY;  Service: Endoscopy;  Laterality: N/A;   HERNIA REPAIR     x4-all from liver transplant   HYSTEROSCOPY WITH D & C N/A 11/13/2021   Procedure: DILATATION AND CURETTAGE /HYSTEROSCOPY;  Surgeon: Victor Claudell SAUNDERS, MD;  Location: [facility] ORS;  Service: Gynecology;  Laterality: N/A;   infusaport     LIVER TRANSPLANT  12/17/1991   LUMBAR PUNCTURE     PORT A CATH INJECTION ([facility] HX)     RENAL BIOPSY     tumor removal  2015   cancer right side    Allergies  Allergies  Allergen Reactions   Penicillin G Itching, Rash, Anaphylaxis and Palpitations   Lamictal  [Lamotrigine ] Rash   Tape    Carbamazepine Other (See Comments)    Medication interaction-prograf     Hydrocodone-Acetaminophen  Itching   Naproxen Itching   Penicillins    Vancomycin  Rash    Macular/blotchy rash at infusion site       History of Present Illness      48 y.o. 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  She previously went stress testing December 2022, which was low risk without ischemia or infarct.  She was admitted to [facility] in May 2024 in the setting of pleuritic chest pain and sepsis without source of infection.  CT of the chest, abdomen and pelvis, showed a small pericardial effusion.  Echocardiogram was performed and showed normal LV and RV function without significant effusion or valvular heart disease.  In the setting of presumed pericarditis, she was placed on colchicine  and ibuprofen  with improvement in chest pain and was subsequently discharged on broad-spectrum antibiotics.   She subsequently underwent outpatient event monitoring in the setting of complaints of palpitations.  This showed predominantly sinus rhythm with 5 brief runs of SVT up to 13 beats and rare PACs/PVCs without sustained arrhythmias.  She recovered from pericarditis without incident.   Ms. Fitzpatrick was last seen in cardiology clinic in 05/2023, at which time she was feeling well with only infrequent and brief palpitations.  In January 2025, she was admitted to [facility] with SIRS and flu A, which was treated with Tamiflu .  Earlier this month, she was admitted to [facility] with fever and cough.  CTA was negative for PE but suggested early asymmetric edema versus infection.  CT of the abdomen pelvis with contrast showed liver nodularity suggestive of cirrhosis with distal esophageal varices and mild splenomegaly, but no acute findings.  She was diagnosed with sepsis and respiratory panel was positive for metapneumovirus.  Blood pressures were  low normal throughout admission and lisinopril  was held at discharge.  She was seen back in the emergency department on June 23 due to left-sided pleuritic chest pain associated with fever that occurred the day prior.  Troponin was normal at 7 x 2.  CT angio of the chest was negative for PE or other acute intrathoracic pathology.  Specifically, no pericardial effusion.  She was discharged home on a prednisone  40 mg burst x 5 days.  Since her ED visit, she has continued to have intermittent substernal chest discomfort with deep breathing.  Symptoms do not occur at rest while sitting but can be felt while lying flat.  She is still on prednisone  and overall, symptoms have improved but not completely resolved.  She denies dyspnea, palpitations, PND, orthopnea, dizziness, syncope, edema, or early satiety. Objective   Home Medications    Current Outpatient Medications  Medication  Sig Dispense Refill   Accu-Chek Softclix Lancets lancets 2 (two) times daily.     albuterol  (VENTOLIN  HFA) 108 (90 Base) MCG/ACT inhaler Inhale 2 puffs into the lungs every 6 (six) hours as needed for wheezing or shortness of breath. 8 g 0   baclofen  (LIORESAL ) 10 MG tablet Take 1 tablet by mouth 2 (two) times daily.     benztropine  (COGENTIN ) 1 MG tablet TAKE 1 TABLET BY MOUTH ONCE DAILY AS NEEDED FOR TREMORS 30 tablet 10   Cranberry-Vitamin C-Probiotic (AZO CRANBERRY PO) Take by mouth.     fluticasone  (FLONASE ) 50 MCG/ACT nasal spray Place into the nose.     glipiZIDE  (GLUCOTROL  XL) 10 MG 24 hr tablet TAKE 1 TABLET BY MOUTH IN THE MORNING AND AT BEDTIME 60 tablet 5   Insulin  Glargine (BASAGLAR  KWIKPEN) 100 UNIT/ML Inject 40 Units into the skin at bedtime.     Insulin  Pen Needle 32G X 6 MM MISC 1 each by Does not apply route as needed. 100 each 5   lidocaine  (LIDODERM ) 5 % Place 1 patch onto the skin daily for 10 days. Remove & Discard patch within 12 hours or as directed by MD 10 patch 0   [Paused] lisinopril  (ZESTRIL ) 20 MG  tablet TAKE 1 TABLET BY MOUTH ONCE DAILY *PATIENT WILL NEED TO SCHEDULE AN APPOINTMENT FOR ADDITIONAL REFILLS* 90 tablet 0   lurasidone  (LATUDA ) 20 MG TABS tablet TAKE 1 TABLET BY MOUTH ONCE DAILY WITH SUPPER *REFILL REQUEST* 90 tablet 10   magnesium  oxide (MAG-OX) 400 MG tablet Take 1,000 mg by mouth 2 (two) times daily.     Melatonin 10 MG TABS Take 10 mg by mouth at bedtime.     metFORMIN  (GLUCOPHAGE ) 1000 MG tablet TAKE 1 TABLET BY MOUTH TWICE DAILY WITH MEALS *PATIENT WILL NEED TO SCHEDULE APPOINTMENT FOR ADDITIONAL REFILLS* 180 tablet 0   mirabegron  ER (MYRBETRIQ ) 50 MG TB24 tablet Take 1 tablet (50 mg total) by mouth daily. 90 tablet 3   mirtazapine  (REMERON ) 15 MG tablet TAKE 1 TABLET BY MOUTH EVERY DAY AT BEDTIME *REFILL REQUEST* 90 tablet 10   naloxone (NARCAN) nasal spray 4 mg/0.1 mL Place 0.4 mg into the nose once.     NOVOLOG  FLEXPEN 100 UNIT/ML FlexPen Inject 0-30 Units into the skin 3 (three) times daily with meals. Sliding scale     ondansetron  (ZOFRAN ) 4 MG tablet Take 1 tablet (4 mg total) by mouth every 6 (six) hours as needed for nausea. 20 tablet 0   oxyCODONE  (OXY IR/ROXICODONE ) 5 MG immediate release tablet Take 5 mg by mouth 3 (three) times daily as needed.     PRECISION QID TEST test strip      predniSONE  (DELTASONE ) 20 MG tablet Take 2 tablets (40 mg total) by mouth daily with breakfast for 5 days. 10 tablet 0   pregabalin  (LYRICA ) 200 MG capsule Take 1 capsule (200 mg total) by mouth 2 (two) times daily. 60 capsule 0   rosuvastatin  (CRESTOR ) 10 MG tablet Take 1 tablet (10 mg total) by mouth daily. 90 tablet 2   Semaglutide ,0.25 or 0.5MG /DOS, 2 MG/3ML SOPN Inject 0.25 mg into the skin once a week. 3 mL 0   tacrolimus  (PROGRAF ) 1 MG capsule Take 1 capsule by mouth every morning.     TYLENOL  PM EXTRA STRENGTH 1000-50 MG/30ML LIQD Take by mouth.     UBRELVY  50 MG TABS Take 1 tablet by mouth daily as needed.     venlafaxine  XR (EFFEXOR -XR) 37.5 MG 24  hr capsule Take 37.5 mg  by mouth every morning.     venlafaxine  XR (EFFEXOR -XR) 75 MG 24 hr capsule Take 1 capsule (75 mg total) by mouth daily with breakfast. Take along with 37.5 mg daily 30 capsule 10   benzonatate  (TESSALON ) 100 MG capsule Take 2 capsules (200 mg total) by mouth 2 (two) times daily as needed for cough. (Patient not taking: Reported on 04/01/2024) 20 capsule 0   Cholecalciferol  (VITAMIN D ) 125 MCG (5000 UT) CAPS Take 5,000 Units by mouth daily. (Patient not taking: Reported on 04/01/2024)     No current facility-administered medications for this visit.     Physical Exam    VS:  BP 124/74 (BP Location: Left Arm, Patient Position: Sitting, Cuff Size: Normal)   Pulse 79   Ht 5' 6 (1.676 m)   Wt 249 lb 9.6 oz (113.2 kg)   LMP 10/16/2021 (Exact Date) Comment: Seen by OB GYN on 10/16/21 for 6 days of excessive bleeding as a post menapause  SpO2 97%   BMI 40.29 kg/m  , BMI Body mass index is 40.29 kg/m.          GEN: Well nourished, well developed, in no acute distress. HEENT: normal. Neck: Supple, no JVD, carotid bruits, or masses. Cardiac: RRR, no murmurs, rubs, or gallops. No clubbing, cyanosis, edema.  Radials 2+/PT 2+ and equal bilaterally.  Respiratory:  Respirations regular and unlabored, clear to auscultation bilaterally. GI: Soft, nontender, nondistended, BS + x 4. MS: no deformity or atrophy. Skin: warm and dry, no rash. Neuro:  Strength and sensation are intact. Psych: Normal affect.  Accessory Clinical Findings    ECG personally reviewed by me today - EKG Interpretation Date/Time:  Thursday April 01 2024 08:38:53 EDT Ventricular Rate:  79 PR Interval:  172 QRS Duration:  170 QT Interval:  440 QTC Calculation: 504 R Axis:   -26  Text Interpretation: Normal sinus rhythm Right bundle branch block Confirmed by Vivienne Bruckner [phone]) on 04/01/2024 8:42:43 AM  - no acute changes.  Lab Results  Component Value Date   WBC 11.0 (H) 03/29/2024   HGB 13.4 03/29/2024   HCT 41.2  03/29/2024   MCV 87.7 03/29/2024   PLT 209 03/29/2024   Lab Results  Component Value Date   CREATININE 1.08 (H) 03/29/2024   BUN 20 03/29/2024   NA 135 03/29/2024   K 5.1 03/29/2024   CL 102 03/29/2024   CO2 20 (L) 03/29/2024   Lab Results  Component Value Date   ALT 14 03/29/2024   AST 22 03/29/2024   ALKPHOS 68 03/29/2024   BILITOT 1.8 (H) 03/29/2024   Lab Results  Component Value Date   CHOL 119 11/13/2023   HDL 37 (L) 11/13/2023   LDLCALC 50 11/13/2023   TRIG 303 (H) 11/13/2023   CHOLHDL 3.2 11/13/2023    Lab Results  Component Value Date   HGBA1C 11.8 (H) 11/13/2023   Lab Results  Component Value Date   TSH 4.878 (H) 03/09/2023       Assessment & Plan    1.  Pleuritic chest pain/history of pericarditis: Patient with prior history of pericarditis and small pericardial effusion may have 2024, which was successfully treated with nonsteroidals and colchicine .  She was admitted in early June with metapneumovirus/sepsis, along with pleuritic chest pain.  The respiratory symptoms have improved, she is continue to have pleuritic chest discomfort, noted while lying flat or when taking deep breaths.  She otherwise does not have symptoms at  rest.  She was recently seen in the emergency department with CTA negative for PE or pericardial effusion.  Troponins were also normal.  She has known scattered coronary calcifications and aortic atherosclerosis on prior CT as well.  She is currently on a prednisone  burst that was provided by the ED physician.  Symptoms have improved but not resolved since then.  ECG without acute ST or T changes.  I will arrange for 2D echocardiogram to rule out effusion and have also recommended CRP and sed rate.  She indicates that she is seeing her primary care provider this afternoon and expects to have additional labs potentially including an ANA, and therefore, I will defer having labs drawn here in order to save her a stick.  If CRP and sed rate markedly  elevated despite prednisone , would likely look to treat her with a prolonged course of colchicine  and short course of nonsteroidals/PPI.  2.  Palpitations: Prior event monitoring with rare PACs, PVCs, and 5 brief runs of PSVT.  Overall quiescent in the absence of an AV nodal blocking agent.  3.  Primary hypertension: Stable on lisinopril .  4.  Hyperlipidemia/hypertriglyceridemia: LDL of 50 with triglycerides of 303 in February of this year.  She remains on rosuvastatin .  5.  Prolonged QT: History of prolonged QT in the context of right bundle branch block.  QTc today is 504 though her QRS is wider today at 170, than what is typically seen (150-154 in ECGs earlier this month).  Continue to avoid QT prolonging medications.  6.  Type 2 diabetes mellitus: Poorly controlled with an A1c of 11.8 in February.  Managed by primary care.  7.  Disposition: Patient have lab work with primary care today.  Recommend following up a sed rate and CRP and if elevated consider course of colchicine  and nonsteroidals.  Follow-up echocardiogram.  Follow-up in clinic in 1 month to reassess.  Lonni Meager, NP 04/01/2024, 8:43 AM

## 2024-04-02 ENCOUNTER — Telehealth (INDEPENDENT_AMBULATORY_CARE_PROVIDER_SITE_OTHER): Admitting: Psychiatry

## 2024-04-02 ENCOUNTER — Other Ambulatory Visit: Payer: Self-pay | Admitting: Nurse Practitioner

## 2024-04-02 ENCOUNTER — Ambulatory Visit: Admitting: Physical Therapy

## 2024-04-02 ENCOUNTER — Encounter

## 2024-04-02 ENCOUNTER — Encounter: Payer: Self-pay | Admitting: Psychiatry

## 2024-04-02 ENCOUNTER — Encounter: Payer: Self-pay | Admitting: Cardiology

## 2024-04-02 ENCOUNTER — Ambulatory Visit: Payer: Self-pay | Admitting: Nurse Practitioner

## 2024-04-02 ENCOUNTER — Telehealth: Payer: Self-pay | Admitting: Pharmacy Technician

## 2024-04-02 ENCOUNTER — Other Ambulatory Visit (HOSPITAL_COMMUNITY): Payer: Self-pay

## 2024-04-02 DIAGNOSIS — M329 Systemic lupus erythematosus, unspecified: Secondary | ICD-10-CM

## 2024-04-02 DIAGNOSIS — F411 Generalized anxiety disorder: Secondary | ICD-10-CM | POA: Diagnosis not present

## 2024-04-02 DIAGNOSIS — G4701 Insomnia due to medical condition: Secondary | ICD-10-CM | POA: Diagnosis not present

## 2024-04-02 DIAGNOSIS — E119 Type 2 diabetes mellitus without complications: Secondary | ICD-10-CM

## 2024-04-02 DIAGNOSIS — F3176 Bipolar disorder, in full remission, most recent episode depressed: Secondary | ICD-10-CM | POA: Diagnosis not present

## 2024-04-02 DIAGNOSIS — F4310 Post-traumatic stress disorder, unspecified: Secondary | ICD-10-CM | POA: Diagnosis not present

## 2024-04-02 LAB — LIPID PANEL
Cholesterol: 122 mg/dL (ref <200)
HDL: 30 mg/dL — ABNORMAL LOW (ref >=50)
LDL Cholesterol (Calc): 57 mg/dL
Non-HDL Cholesterol (Calc): 92 mg/dL (ref <130)
Total CHOL/HDL Ratio: 4.1 (calc) (ref <5.0)
Triglycerides: 329 mg/dL — ABNORMAL HIGH (ref <150)

## 2024-04-02 LAB — CBC WITH DIFFERENTIAL/PLATELET
Absolute Lymphocytes: 3330 {cells}/uL (ref 850–3900)
Absolute Monocytes: 756 {cells}/uL (ref 200–950)
Basophils Absolute: 54 {cells}/uL (ref 0–200)
Basophils Relative: 0.6 %
Eosinophils Absolute: 72 {cells}/uL (ref 15–500)
Eosinophils Relative: 0.8 %
HCT: 37.2 % (ref 35.0–45.0)
Hemoglobin: 11.6 g/dL — ABNORMAL LOW (ref 11.7–15.5)
MCH: 28.3 pg (ref 27.0–33.0)
MCHC: 31.2 g/dL — ABNORMAL LOW (ref 32.0–36.0)
MCV: 90.7 fL (ref 80.0–100.0)
MPV: 11.1 fL (ref 7.5–12.5)
Monocytes Relative: 8.4 %
Neutro Abs: 4788 {cells}/uL (ref 1500–7800)
Neutrophils Relative %: 53.2 %
Platelets: 201 10*3/uL (ref 140–400)
RBC: 4.1 10*6/uL (ref 3.80–5.10)
RDW: 15 % (ref 11.0–15.0)
Total Lymphocyte: 37 %
WBC: 9 10*3/uL (ref 3.8–10.8)

## 2024-04-02 LAB — COMPREHENSIVE METABOLIC PANEL WITH GFR
AG Ratio: 1.5 (calc) (ref 1.0–2.5)
ALT: 13 U/L (ref 6–29)
AST: 12 U/L (ref 10–35)
Albumin: 4 g/dL (ref 3.6–5.1)
Alkaline phosphatase (APISO): 79 U/L (ref 37–153)
BUN/Creatinine Ratio: 24 (calc) — ABNORMAL HIGH (ref 6–22)
BUN: 27 mg/dL — ABNORMAL HIGH (ref 7–25)
CO2: 23 mmol/L (ref 20–32)
Calcium: 8.9 mg/dL (ref 8.6–10.4)
Chloride: 97 mmol/L — ABNORMAL LOW (ref 98–110)
Creat: 1.14 mg/dL — ABNORMAL HIGH (ref 0.50–1.03)
Globulin: 2.7 g/dL (ref 1.9–3.7)
Glucose, Bld: 439 mg/dL — ABNORMAL HIGH (ref 65–99)
Potassium: 4.2 mmol/L (ref 3.5–5.3)
Sodium: 131 mmol/L — ABNORMAL LOW (ref 135–146)
Total Bilirubin: 0.4 mg/dL (ref 0.2–1.2)
Total Protein: 6.7 g/dL (ref 6.1–8.1)
eGFR: 59 mL/min/{1.73_m2} — ABNORMAL LOW (ref >=60)

## 2024-04-02 LAB — HEMOGLOBIN A1C
Hgb A1c MFr Bld: 9.7 % — ABNORMAL HIGH (ref <5.7)
Mean Plasma Glucose: 232 mg/dL
eAG (mmol/L): 12.8 mmol/L

## 2024-04-02 LAB — SEDIMENTATION RATE: Sed Rate: 45 mm/h — ABNORMAL HIGH (ref 0–20)

## 2024-04-02 LAB — HM DIABETES EYE EXAM

## 2024-04-02 LAB — C-REACTIVE PROTEIN: CRP: 14.2 mg/L — ABNORMAL HIGH (ref <8.0)

## 2024-04-02 MED ORDER — LANTUS SOLOSTAR 100 UNIT/ML ~~LOC~~ SOPN: 40.0000 [IU] | PEN_INJECTOR | Freq: Every day | SUBCUTANEOUS | 3 refills | Status: DC

## 2024-04-02 NOTE — Progress Notes (Signed)
 Virtual Visit via Video Note  I connected with Alison [name] on 04/02/24 at  9:00 AM EDT by a video enabled telemedicine application and verified that I am speaking with the correct person using two identifiers.  Location Provider Location : [facility] Patient Location : Home  Participants: Patient , Provider    I discussed the limitations of evaluation and management by telemedicine and the availability of in person appointments. The patient expressed understanding and agreed to proceed.  I discussed the assessment and treatment plan with the patient. The patient was provided an opportunity to ask questions and all were answered. The patient agreed with the plan and demonstrated an understanding of the instructions.   The patient was advised to call back or seek an in-person evaluation if the symptoms worsen or if the condition fails to improve as anticipated.   BH MD OP Progress Note  04/02/2024 2:18 PM Alison [name]  MRN:  [id number]  Chief Complaint:  Chief Complaint  Patient presents with   Follow-up   Anxiety   Depression   Medication Refill   Discussed the use of AI scribe software for clinical note transcription with the patient, who gave verbal consent to proceed.  History of Present Illness Alison [name] is a 48 year old Caucasian female, lives in [place], single, on disability, history of bipolar disorder, PTSD, medical problems including GI issues, history of liver transplant, B-cell lymphoma, SLE, diabetes mellitus, stage IV chronic kidney disease, OSA, chronic migraine headaches, hyperlipidemia, hypertension was evaluated by telemedicine today.  She has a history of depression and is currently seeing a therapist, Donald Lek, at [facility] in [place] once a month. She experiences some depression but manages her symptoms with therapy and medication. She is open to increasing the frequency of her therapy sessions if needed.  She is currently taking  Latuda , venlafaxine  (Effexor ) at 112.5 mg, mirtazapine  at 15 mg, and benztropine  (Cogentin ). She also takes oxycodone  for pain management. Her recent EKG showed changes in her QTc interval, and she has a history of chronic right bundle branch block. Her cardiologist has been involved in her care.  Recently, she experienced severe pleurisy, leading to an emergency room visit at [facility] on March 29, 2024. She had central chest pain and underwent tests to rule out cardiac issues.  She has upcoming appointment scheduled with cardiology.  No changes in appetite and she is sleeping well. Her lisinopril  was recently discontinued due to low blood pressure.  She denies any other medication changes.  Denies any suicidality, homicidality or perceptual disturbances.  She has a new service dog named Iris and she has been an emotional support for her.   Visit Diagnosis:    ICD-10-CM   1. Bipolar 1 disorder, depressed, full remission (HCC)  F31.76     2. PTSD (post-traumatic stress disorder)  F43.10     3. GAD (generalized anxiety disorder)  F41.1     4. Insomnia due to medical condition  G47.01    More pain OSA noncompliant on CPAP      Past Psychiatric History: I have reviewed past psychiatric history from progress note on 06/30/2019.  Past Medical History:  Past Medical History:  Diagnosis Date   Abnormal uterine bleeding    Allergy    Anxiety    Arthritis    Bipolar disorder (manic depression) (HCC)    Cancer (HCC) Diffuse B-Cell Lymphoma   2015   Chest pain    a. 09/2021 MV: Low risk  without ischemia or infarct.  Normal EF.   Chronic kidney failure    Chronic renal disease, stage III (HCC)    Diabetes mellitus without complication (HCC)    DLBCL (diffuse large B cell lymphoma) (HCC) 2015   Right axillary lymph node resected and chemo tx's.   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 02/2023 Echo: EF 55-60%, no rwma, nl RV size/fxn. Mildly dil LA.   Personality disorder (HCC)    Pineal gland cyst    Post herpetic neuralgia    PTSD (post-traumatic stress disorder)    RBBB    Renal disorder    S/P liver transplant (HCC) 1993   Sleep apnea    has not recieved cpap yet-other one was recalled    Past Surgical History:  Procedure Laterality Date   BONE MARROW BIOPSY  01/13/2015   BREAST BIOPSY Right 12/2014   US  Axilla Bx, Positive for Lymphoma   BREAST BIOPSY Right 2011   benign   BREAST BIOPSY Right 2023   Stereo bx, X-clip, Fat Necrosis   BREAST BIOPSY Right 01/20/2024   US  RT BREAST BX W LOC DEV 1ST LESION IMG BX SPEC US  GUIDE 01/20/2024 [facility]   BREAST SURGERY Right 2016   Lymphoma, lymph node removal. pt then got a seroma and had to do more surgery   CHOLECYSTECTOMY     COLONOSCOPY     COLONOSCOPY WITH PROPOFOL  N/A 02/28/2022   Procedure: COLONOSCOPY WITH PROPOFOL ;  Surgeon: Jinny Carmine, MD;  Location: [facility] END[facility]COPY;  Service: Endoscopy;  Laterality: N/A;   ESOPHAGOGASTRODUODENOSCOPY     ESOPHAGOGASTRODUODENOSCOPY (EGD) WITH PROPOFOL  N/A 01/09/2021   Procedure: ESOPHAGOGASTRODUODENOSCOPY (EGD) WITH PROPOFOL ;  Surgeon: Jinny Carmine, MD;  Location: [facility] ENDOSCOPY;  Service: Endoscopy;  Laterality: N/A;   HERNIA REPAIR     x4-all from liver transplant   HYSTEROSCOPY WITH D & C N/A 11/13/2021   Procedure: DILATATION AND CURETTAGE /HYSTEROSCOPY;  Surgeon: Victor Claudell SAUNDERS, MD;  Location: [facility] ORS;  Service: Gynecology;  Laterality: N/A;   infusaport     LIVER TRANSPLANT  12/17/1991   LUMBAR PUNCTURE     PORT A CATH INJECTION ([facility]  HX)     RENAL BIOPSY     tumor removal  2015   cancer right side    Family Psychiatric History: I have reviewed family psychiatric history from progress note on 06/30/2019.  Family History:  Family History  Problem Relation Age of Onset   Heart disease Mother    Hypertension Mother    Cancer - Other Mother    Bipolar disorder Mother    Anxiety disorder Mother    Arthritis Mother    Depression Mother    Heart disease Father    Hypertension Father    Diabetes Father    Parkinson's disease Maternal Grandmother    COPD Maternal Grandmother    Cancer Maternal Aunt    Cancer Maternal Uncle  Cancer Maternal Grandfather    Lupus Paternal Grandmother    Hypertension Brother    Alcohol abuse Brother    Depression Brother    Alcohol abuse Maternal Uncle     Social History: I have reviewed social history from progress note on 06/30/2019. Social History   Socioeconomic History   Marital status: Single    Spouse name: Not on file   Number of children: 0   Years of education: 9   Highest education level: GED or equivalent  Occupational History   Occupation: disabled  Tobacco Use   Smoking status: Never    Passive exposure: Past   Smokeless tobacco: Never   Tobacco comments:    Occasional CBD oil  Vaping Use   Vaping status: Never Used  Substance and Sexual Activity   Alcohol use: Not on file   Drug use: Not Currently    Comment: pain managment last used in early april   Sexual activity: Not Currently  Other Topics Concern   Not on file  Social History Narrative   Not on file   Social Drivers of Health   Financial Resource Strain: Low Risk  (04/01/2024)   Overall Financial Resource Strain (CARDIA)    Difficulty of Paying Living Expenses: Not very hard  Food Insecurity: No Food Insecurity (04/01/2024)   Hunger Vital Sign    Worried About Running Out of Food in the Last Year: Never true    Ran Out of Food in the Last Year: Never true  Transportation Needs: No  Transportation Needs (04/01/2024)   PRAPARE - Administrator, Civil Service (Medical): No    Lack of Transportation (Non-Medical): No  Physical Activity: Inactive (04/01/2024)   Exercise Vital Sign    Days of Exercise per Week: 0 days    Minutes of Exercise per Session: 0 min  Stress: Stress Concern Present (04/01/2024)   [employer] of Occupational Health - Occupational Stress Questionnaire    Feeling of Stress: To some extent  Social Connections: Moderately Integrated (04/01/2024)   Social Connection and Isolation Panel    Frequency of Communication with Friends and Family: Twice a week    Frequency of Social Gatherings with Friends and Family: Once a week    Attends Religious Services: Never    Database administrator or Organizations: No    Attends Engineer, structural: 1 to 4 times per year    Marital Status: Living with partner    Allergies:  Allergies  Allergen Reactions   Penicillin G Itching, Rash, Anaphylaxis and Palpitations   Lamictal  [Lamotrigine ] Rash   Tape    Carbamazepine Other (See Comments)    Medication interaction-prograf     Hydrocodone-Acetaminophen  Itching   Naproxen Itching   Penicillins    Vancomycin  Rash    Macular/blotchy rash at infusion site    Metabolic Disorder Labs: Lab Results  Component Value Date   HGBA1C 9.7 (H) 04/01/2024   MPG 232 04/01/2024   MPG 292 11/13/2023   Lab Results  Component Value Date   PROLACTIN 16.4 12/12/2022   PROLACTIN 13.4 07/15/2019   Lab Results  Component Value Date   CHOL 122 04/01/2024   TRIG 329 (H) 04/01/2024   HDL 30 (L) 04/01/2024   CHOLHDL 4.1 04/01/2024   VLDL 17 03/08/2023   LDLCALC 57 04/01/2024   LDLCALC 50 11/13/2023   Lab Results  Component Value Date   TSH 1.56 04/01/2024   TSH 4.878 (H) 03/09/2023    Therapeutic Level  Labs: No results found for: LITHIUM No results found for: VALPROATE No results found for: CBMZ  Current Medications: Current  Outpatient Medications  Medication Sig Dispense Refill   Accu-Chek Softclix Lancets lancets 2 (two) times daily.     albuterol  (VENTOLIN  HFA) 108 (90 Base) MCG/ACT inhaler Inhale 2 puffs into the lungs every 6 (six) hours as needed for wheezing or shortness of breath. 8 g 0   baclofen  (LIORESAL ) 10 MG tablet Take 1 tablet by mouth 2 (two) times daily.     benztropine  (COGENTIN ) 1 MG tablet TAKE 1 TABLET BY MOUTH ONCE DAILY AS NEEDED FOR TREMORS (Patient not taking: Reported on 04/01/2024) 30 tablet 10   Cranberry-Vitamin C-Probiotic (AZO CRANBERRY PO) Take by mouth. (Patient not taking: Reported on 04/01/2024)     fluticasone  (FLONASE ) 50 MCG/ACT nasal spray Place into the nose.     glipiZIDE  (GLUCOTROL  XL) 10 MG 24 hr tablet TAKE 1 TABLET BY MOUTH IN THE MORNING AND AT BEDTIME 60 tablet 5   insulin  glargine (LANTUS  SOLOSTAR) 100 UNIT/ML Solostar Pen Inject 40 Units into the skin daily. 15 mL 3   Insulin  Pen Needle 32G X 6 MM MISC 1 each by Does not apply route as needed. 100 each 5   lidocaine  (LIDODERM ) 5 % Place 1 patch onto the skin daily for 10 days. Remove & Discard patch within 12 hours or as directed by MD (Patient not taking: Reported on 04/01/2024) 10 patch 0   [Paused] lisinopril  (ZESTRIL ) 20 MG tablet TAKE 1 TABLET BY MOUTH ONCE DAILY *PATIENT WILL NEED TO SCHEDULE AN APPOINTMENT FOR ADDITIONAL REFILLS* 90 tablet 0   lurasidone  (LATUDA ) 20 MG TABS tablet TAKE 1 TABLET BY MOUTH ONCE DAILY WITH SUPPER *REFILL REQUEST* 90 tablet 10   magnesium  oxide (MAG-OX) 400 MG tablet Take 1,000 mg by mouth 2 (two) times daily.     Melatonin 10 MG TABS Take 10 mg by mouth at bedtime. (Patient taking differently: Take 10 mg by mouth as needed. As needed)     metFORMIN  (GLUCOPHAGE ) 1000 MG tablet TAKE 1 TABLET BY MOUTH TWICE DAILY WITH MEALS *PATIENT WILL NEED TO SCHEDULE APPOINTMENT FOR ADDITIONAL REFILLS* 180 tablet 0   mirabegron  ER (MYRBETRIQ ) 50 MG TB24 tablet Take 1 tablet (50 mg total) by mouth daily.  90 tablet 3   mirtazapine  (REMERON ) 15 MG tablet TAKE 1 TABLET BY MOUTH EVERY DAY AT BEDTIME *REFILL REQUEST* 90 tablet 10   naloxone (NARCAN) nasal spray 4 mg/0.1 mL Place 0.4 mg into the nose once.     NOVOLOG  FLEXPEN 100 UNIT/ML FlexPen Inject 0-30 Units into the skin 3 (three) times daily with meals. Sliding scale     ondansetron  (ZOFRAN ) 4 MG tablet Take 1 tablet (4 mg total) by mouth every 6 (six) hours as needed for nausea. 20 tablet 0   oxyCODONE  (OXY IR/ROXICODONE ) 5 MG immediate release tablet Take 5 mg by mouth 3 (three) times daily as needed.     PRECISION QID TEST test strip      predniSONE  (DELTASONE ) 20 MG tablet Take 2 tablets (40 mg total) by mouth daily with breakfast for 5 days. 10 tablet 0   pregabalin  (LYRICA ) 200 MG capsule Take 1 capsule (200 mg total) by mouth 2 (two) times daily. 60 capsule 0   rosuvastatin  (CRESTOR ) 10 MG tablet Take 1 tablet (10 mg total) by mouth daily. 90 tablet 2   Semaglutide ,0.25 or 0.5MG /DOS, 2 MG/3ML SOPN Inject 0.25 mg into the skin once a week.  3 mL 0   tacrolimus  (PROGRAF ) 1 MG capsule Take 1 capsule by mouth every morning.     TYLENOL  PM EXTRA STRENGTH 1000-50 MG/30ML LIQD Take by mouth. (Patient not taking: Reported on 04/01/2024)     UBRELVY  50 MG TABS Take 1 tablet by mouth daily as needed.     venlafaxine  XR (EFFEXOR -XR) 37.5 MG 24 hr capsule Take 37.5 mg by mouth every morning.     venlafaxine  XR (EFFEXOR -XR) 75 MG 24 hr capsule Take 1 capsule (75 mg total) by mouth daily with breakfast. Take along with 37.5 mg daily 30 capsule 10   No current facility-administered medications for this visit.     Musculoskeletal: Strength & Muscle Tone: UTA Gait & Station: Seated Patient leans: N/A  Psychiatric Specialty Exam: Review of Systems  Psychiatric/Behavioral:  The patient is nervous/anxious.     Last menstrual period 10/16/2021.There is no height or weight on file to calculate BMI.  General Appearance: Casual  Eye Contact:  Fair   Speech:  Clear and Coherent  Volume:  Normal  Mood:  Anxious about current situational stressors  Affect:  Appropriate  Thought Process:  Goal Directed and Descriptions of Associations: Intact  Orientation:  Full (Time, Place, and Person)  Thought Content: Logical   Suicidal Thoughts:  No  Homicidal Thoughts:  No  Memory:  Immediate;   Fair Recent;   Fair Remote;   Fair  Judgement:  Fair  Insight:  Fair  Psychomotor Activity:  Normal  Concentration:  Concentration: Fair and Attention Span: Fair  Recall:  [employer] of Knowledge: Fair  Language: Fair  Akathisia:  No  Handed:  Right  AIMS (if indicated): not done  Assets:  Communication Skills Desire for Improvement Housing Social Support Talents/Skills Transportation  ADL's:  Intact  Cognition: WNL  Sleep:  Fair   Screenings: Midwife Visit from 11/04/2023 in [facility] Visit from 07/21/2023 in [facility] Visit from 12/12/2022 in [facility] Office Visit from 09/12/2022 in [facility] Office Visit from 07/08/2022 in [facility]  AIMS Total Score 0 0 0 0 0   GAD-7    Flowsheet Row Office Visit from 04/01/2024 in [facility] Office Visit from 01/05/2024 in [facility] Office Visit from 07/21/2023 in [facility] Office Visit from 03/19/2023 in [facility] Video Visit from 03/17/2023 in [facility]  Total GAD-7 Score 14 0 8 2 14    PHQ2-9    Flowsheet Row Office Visit from 04/01/2024 in [facility] Office Visit from 01/05/2024 in [facility] Nutrition from 12/29/2023 in [facility] at [facility] Visit from 11/04/2023 in [facility] Office Visit from 09/08/2023 in [facility]  PHQ-2 Total Score 4 0 1 1 0  PHQ-9 Total Score 11 0 -- -- --   Flowsheet Row Video Visit from 04/02/2024 in [facility] ED from 03/29/2024 in [facility] Department at [facility] ED to Hosp-Admission (Discharged) from 03/14/2024 in [facility] PCU  C-SSRS RISK CATEGORY No Risk No Risk No Risk     Assessment and Plan: Alison [name] is a 48 year old  Caucasian female on disability, has a history of bipolar disorder, PTSD, multiple medical problems was evaluated by telemedicine today.  Discussed assessment and plan as noted below.  Insomnia-improving Currently reports sleep is overall good.  Continues to have CPAP mask problems. Encouraged to consult pulmonologist for the same. Encouraged sleep hygiene techniques and pain management.  Posttraumatic stress disorder-stable Currently denies any significant symptoms related to PTSD. Continue Venlafaxine  112.5 mg daily Continue Mirtazapine  15 mg at bedtime  Bipolar disorder in remission Currently denies any significant mood swings, mania or depression symptoms.  Does have anxiety related to current health issues. Continue Latuda  20 mg daily Continue Benztropine  1 mg daily as needed for side effects of Latuda  Continue psychotherapy sessions with Ms. Isaiah.  Generalized anxiety disorder-unstable Currently anxiety symptoms mostly related to health issues, situational stressors. Consider readjusting the dosage of Venlafaxine . Coordinate with cardiology prior to dosage increase. Patient advised to follow up with therapist more frequently.   I have sent communication to cardiology Dr.Agbor-Etang , regarding patient's recent atypical chest pain event as well  as emergency department visit and to EKG variations.  Patient with history of QT prolongation previously due to right bundle branch block.  Will await cardiology clearance prior to dosage increase of medications to target mood symptoms.  Patient in the meantime advised to follow up with therapist.  Follow-up Follow-up in clinic in 3 to 4 weeks or sooner in person.   Collaboration of Care: Collaboration of Care: Referral or follow-up with counselor/therapist AEB encouraged to follow up with therapist.  I have coordinated care with cardiology.  I have reviewed notes per emergency department visit dated 03/29/2024.  Patient/Guardian was advised Release of Information must be obtained prior to any record release in order to collaborate their care with an outside provider. Patient/Guardian was advised if they have not already done so to contact the registration department to sign all necessary forms in order for us  to release information regarding their care.   Consent: Patient/Guardian gives verbal consent for treatment and assignment of benefits for services provided during this visit. Patient/Guardian expressed understanding and agreed to proceed.   This note was generated in part or whole with voice recognition software. Voice recognition is usually quite accurate but there are transcription errors that can and very often do occur. I apologize for any typographical errors that were not detected and corrected.    Alison Padovano, MD 04/02/2024, 2:18 PM

## 2024-04-02 NOTE — Telephone Encounter (Signed)
 Pharmacy Patient Advocate Encounter   Received notification from Onbase that prior authorization for BASAGLAR  KWIKPEN 100 UNIT/ML is required/requested.   Insurance verification completed.   The patient is insured through [facility] .   Per test claim:  LANTUS  SOLOSTAR PEN is preferred by the insurance.  If suggested medication is appropriate, Please send in a new RX and discontinue this one. If not, please advise as to why it's not appropriate so that we may request a Prior Authorization. Please note, some preferred medications may still require a PA.  If the suggested medications have not been trialed and there are no contraindications to their use, the PA will not be submitted, as it will not be approved.

## 2024-04-03 LAB — CULTURE, BLOOD (ROUTINE X 2)
Culture: NO GROWTH
Culture: NO GROWTH
Special Requests: ADEQUATE

## 2024-04-05 ENCOUNTER — Encounter: Payer: Self-pay | Admitting: "Endocrinology

## 2024-04-05 ENCOUNTER — Ambulatory Visit: Admitting: "Endocrinology

## 2024-04-05 VITALS — BP 108/80 | HR 105 | Ht 66.0 in | Wt 244.0 lb

## 2024-04-05 DIAGNOSIS — E782 Mixed hyperlipidemia: Secondary | ICD-10-CM | POA: Diagnosis not present

## 2024-04-05 DIAGNOSIS — Z794 Long term (current) use of insulin: Secondary | ICD-10-CM | POA: Diagnosis not present

## 2024-04-05 DIAGNOSIS — Z7984 Long term (current) use of oral hypoglycemic drugs: Secondary | ICD-10-CM

## 2024-04-05 DIAGNOSIS — E1165 Type 2 diabetes mellitus with hyperglycemia: Secondary | ICD-10-CM

## 2024-04-05 MED ORDER — FREESTYLE LIBRE 3 PLUS SENSOR MISC
3 refills | Status: DC
Start: 2024-04-05 — End: 2024-04-12

## 2024-04-05 MED ORDER — SEMAGLUTIDE (1 MG/DOSE) 4 MG/3ML ~~LOC~~ SOPN: 1.0000 mg | PEN_INJECTOR | SUBCUTANEOUS | 0 refills | Status: DC

## 2024-04-05 NOTE — Patient Instructions (Addendum)
 Novolog  correction scale: Use three times a day 15 min before meals based on blood sugars as follows:  151 - 175: 1 unit 176 - 200: 2 units 201 - 225: 3 units 226 - 250: 4 units 251 - 275: 5 units 276 - 300: 6 units 301 - 325: 7 units 326 - 350: 8 units 351 - 375: 9 units 376 - 400: 10 units

## 2024-04-05 NOTE — Progress Notes (Signed)
 Outpatient Endocrinology Note Alison Birmingham, MD  [birth year]   Alison [name] June 04, 2051 [id number]  Referring Provider: Gareth Mliss Alison [name] Primary Care Provider: Gareth Mliss Alison [name] Reason for consultation: Subjective   Assessment & Plan  There are no diagnoses linked to this encounter.  Diabetes Type II complicated by neuropathy, No results found for: GFR Hba1c goal less than 7, current Hba1c is  Lab Results  Component Value Date   HGBA1C 9.7 (H) 04/01/2024   Will recommend the following: Reinforced compliance  Continue Tresiba 40 units at bedtime (self increased from 30 to 40 but denies any low BG) Continue Novolog  Sliding scale 2:50>150: 15 min before meals  Continue Metformin  1000 mg bid Continue Glipizide  XL 10 mg BID Ozempic  1 mg weekly  Ordered DM education Ordered DexCom, not covered [employer] 3 + Discussed that its unsafe not to check BG before meals, patient understands and greed  Ran out of Trulicity  previously, started on ozempic  and ran out 2 weeks ago   No known contraindications/side effects to any of above medications Glucagon discussed and prescribed with refills on 11/2048  -Last LD and Tg are as follows: Lab Results  Component Value Date   LDLCALC 57 04/01/2024    Lab Results  Component Value Date   TRIG 329 (H) 04/01/2024   -On rosuvastatin  10 mg QD -Follow low fat diet and exercise   -Blood pressure goal <140/90 - Microalbumin/creatinine goal is < 30 -Last MA/Cr is as follows: Lab Results  Component Value Date   MICROALBUR 41.5 11/13/2023   -on ACE/ARB lisinopril  20 mg every day  -diet changes including salt restriction -limit eating outside -counseled BP targets per standards of diabetes care -uncontrolled blood pressure can lead to retinopathy, nephropathy and cardiovascular and atherosclerotic heart disease  Reviewed and counseled on: -A1C target -Blood sugar targets -Complications of uncontrolled diabetes   -Checking blood sugar before meals and bedtime and bring log next visit -All medications with mechanism of action and side effects -Hypoglycemia management: rule of 15's, Glucagon Emergency Kit and medical alert ID -low-carb low-fat plate-method diet -At least 20 minutes of physical activity per day -Annual dilated retinal eye exam and foot exam -compliance and follow up needs -follow up as scheduled or earlier if problem gets worse  Call if blood sugar is less than 70 or consistently above 250    Take a 15 gm snack of carbohydrate at bedtime before you go to sleep if your blood sugar is less than 100.    If you are going to fast after midnight for a test or procedure, ask your physician for instructions on how to reduce/decrease your insulin  dose.    Call if blood sugar is less than 70 or consistently above 250  -Treating a low sugar by rule of 15  (15 gms of sugar every 15 min until sugar is more than 70) If you feel your sugar is low, test your sugar to be sure If your sugar is low (less than 70), then take 15 grams of a fast acting Carbohydrate (3-4 glucose tablets or glucose gel or 4 ounces of juice or regular soda) Recheck your sugar 15 min after treating low to make sure it is more than 70 If sugar is still less than 70, treat again with 15 grams of carbohydrate          Don't drive the hour of hypoglycemia  If unconscious/unable to eat or drink by mouth, use glucagon injection or nasal  spray baqsimi and call 911. Can repeat again in 15 min if still unconscious.  Return in about 2 weeks (around 04/19/2024).   I have reviewed current medications, nurse's notes, allergies, vital signs, past medical and surgical history, family medical history, and social history for this encounter. Counseled patient on symptoms, examination findings, lab findings, imaging results, treatment decisions and monitoring and prognosis. The patient understood the recommendations and agrees with the  treatment plan. All questions regarding treatment plan were fully answered.  Alison Birmingham, MD  [birth year]   History of Present Illness Alison [name] is a 48 y.o. year old female who presents for follow up of Type II diabetes mellitus.  Alison [name] was first diagnosed around 2020.   Diabetes education +  Home diabetes regimen: Tresiba 40 units at bedtime Novolog  Sliding scale 2:50>150 right before break fast only  Metformin  1000 mg bid Glipizide  XL 10 gm bid  Ozempic  0.5 mg weekly -ran out: well tolerated without issues   Ran out of Trulicity  for 2 mo   COMPLICATIONS -  MI/Stroke -  retinopathy +  neuropathy -  nephropathy  SYMPTOMS REVIEWED - Polyuria - Weight loss - Blurred vision  BLOOD SUGAR DATA Rarely checks BG Checked today only: 348   Physical Exam  BP 108/80   Pulse (!) 105   Ht 5' 6 (1.676 m)   Wt 244 lb (110.7 kg)   LMP 10/16/2021 (Exact Date) Comment: Seen by OB GYN on 10/16/21 for 6 days of excessive bleeding as a post menapause  SpO2 98%   BMI 39.38 kg/m    Constitutional: well developed, well nourished Head: normocephalic, atraumatic Eyes: sclera anicteric, no redness Neck: supple Lungs: normal respiratory effort Neurology: alert and oriented Skin: dry, no appreciable rashes Musculoskeletal: no appreciable defects Psychiatric: normal mood and affect Diabetic Foot Exam - Simple   No data filed      Current Medications Patient's Medications  New Prescriptions   CONTINUOUS GLUCOSE SENSOR (FREESTYLE LIBRE 3 PLUS SENSOR) MISC    Change sensor every 15 days.   SEMAGLUTIDE , 1 MG/DOSE, 4 MG/3ML SOPN    Inject 1 mg into the skin once a week.  Previous Medications   ACCU-CHEK SOFTCLIX LANCETS LANCETS    2 (two) times daily.   ALBUTEROL  (VENTOLIN  HFA) 108 (90 BASE) MCG/ACT INHALER    Inhale 2 puffs into the lungs every 6 (six) hours as needed for wheezing or shortness of breath.   BACLOFEN  (LIORESAL ) 10 MG TABLET    Take 1 tablet by  mouth 2 (two) times daily.   BENZTROPINE  (COGENTIN ) 1 MG TABLET    TAKE 1 TABLET BY MOUTH ONCE DAILY AS NEEDED FOR TREMORS   CRANBERRY-VITAMIN C-PROBIOTIC (AZO CRANBERRY PO)    Take by mouth.   FLUTICASONE  (FLONASE ) 50 MCG/ACT NASAL SPRAY    Place into the nose.   GLIPIZIDE  (GLUCOTROL  XL) 10 MG 24 HR TABLET    TAKE 1 TABLET BY MOUTH IN THE MORNING AND AT BEDTIME   INSULIN  GLARGINE (LANTUS  SOLOSTAR) 100 UNIT/ML SOLOSTAR PEN    Inject 40 Units into the skin daily.   INSULIN  PEN NEEDLE 32G X 6 MM MISC    1 each by Does not apply route as needed.   LIDOCAINE  (LIDODERM ) 5 %    Place 1 patch onto the skin daily for 10 days. Remove & Discard patch within 12 hours or as directed by MD   LISINOPRIL  (ZESTRIL ) 20 MG TABLET    TAKE 1 TABLET BY  MOUTH ONCE DAILY *PATIENT WILL NEED TO SCHEDULE AN APPOINTMENT FOR ADDITIONAL REFILLS*   LURASIDONE  (LATUDA ) 20 MG TABS TABLET    TAKE 1 TABLET BY MOUTH ONCE DAILY WITH SUPPER *REFILL REQUEST*   MAGNESIUM  OXIDE (MAG-OX) 400 MG TABLET    Take 1,000 mg by mouth 2 (two) times daily.   MELATONIN 10 MG TABS    Take 10 mg by mouth at bedtime.   METFORMIN  (GLUCOPHAGE ) 1000 MG TABLET    TAKE 1 TABLET BY MOUTH TWICE DAILY WITH MEALS *PATIENT WILL NEED TO SCHEDULE APPOINTMENT FOR ADDITIONAL REFILLS*   MIRABEGRON  ER (MYRBETRIQ ) 50 MG TB24 TABLET    Take 1 tablet (50 mg total) by mouth daily.   MIRTAZAPINE  (REMERON ) 15 MG TABLET    TAKE 1 TABLET BY MOUTH EVERY DAY AT BEDTIME *REFILL REQUEST*   NALOXONE (NARCAN) NASAL SPRAY 4 MG/0.1 ML    Place 0.4 mg into the nose once.   NOVOLOG  FLEXPEN 100 UNIT/ML FLEXPEN    Inject 0-30 Units into the skin 3 (three) times daily with meals. Sliding scale   ONDANSETRON  (ZOFRAN ) 4 MG TABLET    Take 1 tablet (4 mg total) by mouth every 6 (six) hours as needed for nausea.   OXYCODONE  (OXY IR/ROXICODONE ) 5 MG IMMEDIATE RELEASE TABLET    Take 5 mg by mouth 3 (three) times daily as needed.   PRECISION QID TEST TEST STRIP       PREGABALIN  (LYRICA ) 200 MG  CAPSULE    Take 1 capsule (200 mg total) by mouth 2 (two) times daily.   ROSUVASTATIN  (CRESTOR ) 10 MG TABLET    Take 1 tablet (10 mg total) by mouth daily.   TACROLIMUS  (PROGRAF ) 1 MG CAPSULE    Take 1 capsule by mouth every morning.   TYLENOL  PM EXTRA STRENGTH 1000-50 MG/30ML LIQD    Take by mouth.   UBRELVY  50 MG TABS    Take 1 tablet by mouth daily as needed.   VENLAFAXINE  XR (EFFEXOR -XR) 37.5 MG 24 HR CAPSULE    Take 37.5 mg by mouth every morning.   VENLAFAXINE  XR (EFFEXOR -XR) 75 MG 24 HR CAPSULE    Take 1 capsule (75 mg total) by mouth daily with breakfast. Take along with 37.5 mg daily  Modified Medications   No medications on file  Discontinued Medications   SEMAGLUTIDE ,0.25 OR 0.5MG /DOS, 2 MG/3ML SOPN    Inject 0.25 mg into the skin once a week.    Allergies Allergies  Allergen Reactions   Penicillin G Itching, Rash, Anaphylaxis and Palpitations   Lamictal  [Lamotrigine ] Rash   Tape    Carbamazepine Other (See Comments)    Medication interaction-prograf     Hydrocodone-Acetaminophen  Itching   Naproxen Itching   Penicillins    Vancomycin  Rash    Macular/blotchy rash at infusion site    Past Medical History Past Medical History:  Diagnosis Date   Abnormal uterine bleeding    Allergy    Anxiety    Arthritis    Bipolar disorder (manic depression) (HCC)    Cancer (HCC) Diffuse B-Cell Lymphoma   2015   Chest pain    a. 09/2021 MV: Low risk without ischemia or infarct.  Normal EF.   Chronic kidney failure    Chronic renal disease, stage III (HCC)    Diabetes mellitus without complication (HCC)    DLBCL (diffuse large B cell lymphoma) (HCC) 2015   Right axillary lymph node resected and chemo tx's.   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 02/2023 Echo: EF 55-60%, no rwma, nl RV size/fxn. Mildly dil LA.   Personality disorder (HCC)    Pineal gland cyst    Post herpetic neuralgia    PTSD (post-traumatic stress disorder)    RBBB    Renal disorder    S/P liver transplant (HCC) 1993   Sleep apnea    has not recieved cpap yet-other one was recalled    Past Surgical History Past Surgical History:  Procedure Laterality Date   BONE MARROW BIOPSY  01/13/2015   BREAST BIOPSY Right 12/2014   US  Axilla Bx, Positive for Lymphoma   BREAST BIOPSY Right 2011   benign   BREAST BIOPSY Right 2023   Stereo bx, X-clip, Fat Necrosis   BREAST BIOPSY Right 01/20/2024   US  RT BREAST BX W LOC DEV 1ST LESION IMG BX SPEC US  GUIDE 01/20/2024 [facility]   BREAST SURGERY Right 2016   Lymphoma, lymph node removal. pt then got a seroma and had to do more surgery   CHOLECYSTECTOMY     COLONOSCOPY     COLONOSCOPY WITH PROPOFOL  N/A 02/28/2022   Procedure: COLONOSCOPY WITH PROPOFOL ;  Surgeon: Jinny Carmine, MD;  Location: [facility] ENDOSCOPY;  Service: Endoscopy;  Laterality: N/A;   ESOPHAGOGASTRODUODENOSCOPY     ESOPHAGOGASTRODUODENOSCOPY (EGD) WITH PROPOFOL  N/A 01/09/2021   Procedure: ESOPHAGOGASTRODUODENOSCOPY (EGD) WITH PROPOFOL ;  Surgeon: Jinny Carmine, MD;  Location: [facility] ENDOSCOPY;  Service: Endoscopy;  Laterality: N/A;   HERNIA REPAIR     x4-all from liver transplant   HYSTEROSCOPY WITH D & C N/A 11/13/2021   Procedure: DILATATION AND CURETTAGE /HYSTEROSCOPY;  Surgeon: Victor Claudell SAUNDERS, MD;  Location: [facility] ORS;  Service: Gynecology;  Laterality: N/A;   infusaport     LIVER TRANSPLANT  12/17/1991   LUMBAR PUNCTURE     PORT A CATH INJECTION ([facility] HX)     RENAL BIOPSY     tumor removal  2015   cancer right side    Family History family history includes  Alcohol abuse in her brother and maternal uncle; Anxiety disorder in her mother; Arthritis in her mother; Bipolar disorder in her mother; COPD in her maternal grandmother; Cancer in her maternal aunt, maternal grandfather, and maternal uncle; Cancer - Other in her mother; Depression in her brother and mother; Diabetes in her father; Heart disease in her father and mother; Hypertension in her brother, father, and mother; Lupus in her paternal grandmother; Parkinson's disease in her maternal grandmother.  Social History Social History   Socioeconomic History   Marital status: Single    Spouse name: Not on file   Number of children: 0   Years of education: 9   Highest education level: GED or equivalent  Occupational History   Occupation: disabled  Tobacco Use   Smoking status: Never    Passive exposure: Past   Smokeless tobacco: Never   Tobacco comments:    Occasional CBD oil  Vaping Use   Vaping  status: Never Used  Substance and Sexual Activity   Alcohol use: Not on file   Drug use: Not Currently    Comment: pain managment last used in early april   Sexual activity: Not Currently  Other Topics Concern   Not on file  Social History Narrative   Not on file   Social Drivers of Health   Financial Resource Strain: Low Risk  (04/01/2024)   Overall Financial Resource Strain (CARDIA)    Difficulty of Paying Living Expenses: Not very hard  Food Insecurity: No Food Insecurity (04/01/2024)   Hunger Vital Sign    Worried About Running Out of Food in the Last Year: Never true    Ran Out of Food in the Last Year: Never true  Transportation Needs: No Transportation Needs (04/01/2024)   PRAPARE - Administrator, Civil Service (Medical): No    Lack of Transportation (Non-Medical): No  Physical Activity: Inactive (04/01/2024)   Exercise Vital Sign    Days of Exercise per Week: 0 days    Minutes of Exercise per Session: 0 min  Stress: Stress Concern Present (04/01/2024)   [employer] of Occupational [employer] - Occupational Stress Questionnaire    Feeling of Stress: To some extent  Social Connections: Moderately Integrated (04/01/2024)   Social Connection and Isolation Panel    Frequency of Communication with Friends and Family: Twice a week    Frequency of Social Gatherings with Friends and Family: Once a week    Attends Religious Services: Never    Database administrator or Organizations: No    Attends Banker Meetings: 1 to 4 times per year    Marital Status: Living with partner  Intimate Partner Violence: Not At Risk (04/01/2024)   Humiliation, Afraid, Rape, and Kick questionnaire    Fear of Current or Ex-Partner: No    Emotionally Abused: No    Physically Abused: No    Sexually Abused: No    Lab Results  Component Value Date   HGBA1C 9.7 (H) 04/01/2024   HGBA1C 11.8 (H) 11/13/2023   HGBA1C 10.8 (H) 10/16/2023   Lab Results  Component Value Date   CHOL 122 04/01/2024   Lab Results  Component Value Date   HDL 30 (L) 04/01/2024   Lab Results  Component Value Date   LDLCALC 57 04/01/2024   Lab Results  Component Value Date   TRIG 329 (H) 04/01/2024   Lab Results  Component Value Date   CHOLHDL 4.1 04/01/2024   Lab Results  Component Value Date   CREATININE 1.14 (H) 04/01/2024   No results found for: GFR Lab Results  Component Value Date   MICROALBUR 41.5 11/13/2023      Component Value Date/Time   NA 131 (L) 04/01/2024 1043   K 4.2 04/01/2024 1043   CL 97 (L) 04/01/2024 1043   CO2 23 04/01/2024 1043   GLUCOSE 439 (H) 04/01/2024 1043   BUN 27 (H) 04/01/2024 1043   CREATININE 1.14 (H) 04/01/2024 1043   CALCIUM  8.9 04/01/2024 1043   CALCIUM  7.6 (L) 06/27/2022 1448   PROT 6.7 04/01/2024 1043   ALBUMIN 3.1 (L) 03/29/2024 0754   AST 12 04/01/2024 1043   AST 22 03/03/2024 0958   ALT 13 04/01/2024 1043   ALT 19 03/03/2024 0958   ALKPHOS 68 03/29/2024 0754   BILITOT 0.4 04/01/2024 1043   BILITOT 0.5 03/03/2024  0958   GFRNONAA >60 03/29/2024 0520   GFRNONAA >60 03/03/2024 9041  GFRNONAA 64 12/11/2020 0000   GFRAA 74 12/11/2020 0000      Latest Ref Rng & Units 04/01/2024   10:43 AM 03/29/2024    5:20 AM 03/16/2024    7:28 AM  BMP  Glucose 65 - 99 mg/dL 560  768  826   BUN 7 - 25 mg/dL 27  20  16    Creatinine 0.50 - 1.03 mg/dL 8.85  8.91  9.04   BUN/Creat Ratio 6 - 22 (calc) 24     Sodium 135 - 146 mmol/L 131  135  136   Potassium 3.5 - 5.3 mmol/L 4.2  5.1  4.1   Chloride 98 - 110 mmol/L 97  102  107   CO2 20 - 32 mmol/L 23  20  23    Calcium  8.6 - 10.4 mg/dL 8.9  9.3  8.0        Component Value Date/Time   WBC 9.0 04/01/2024 1043   RBC 4.10 04/01/2024 1043   HGB 11.6 (L) 04/01/2024 1043   HGB 11.9 (L) 03/03/2024 0945   HGB 14.6 10/16/2021 1122   HCT 37.2 04/01/2024 1043   HCT 44.0 10/16/2021 1122   PLT 201 04/01/2024 1043   PLT 223 03/03/2024 0945   MCV 90.7 04/01/2024 1043   MCV 88 10/16/2021 1122   MCH 28.3 04/01/2024 1043   MCHC 31.2 (L) 04/01/2024 1043   RDW 15.0 04/01/2024 1043   RDW 13.7 10/16/2021 1122   LYMPHSABS 1.8 03/14/2024 2145   LYMPHSABS 2.0 10/16/2021 1122   MONOABS 0.7 03/14/2024 2145   EOSABS 72 04/01/2024 1043   EOSABS 0.4 10/16/2021 1122   BASOSABS 54 04/01/2024 1043   BASOSABS 0.1 10/16/2021 1122     Parts of this note may have been dictated using voice recognition software. There may be variances in spelling and vocabulary which are unintentional. Not all errors are proofread. Please notify the dino if any discrepancies are noted or if the meaning of any statement is not clear.

## 2024-04-06 ENCOUNTER — Ambulatory Visit: Admitting: Physical Therapy

## 2024-04-06 ENCOUNTER — Other Ambulatory Visit: Payer: Self-pay | Admitting: Nurse Practitioner

## 2024-04-06 ENCOUNTER — Encounter

## 2024-04-10 ENCOUNTER — Other Ambulatory Visit: Payer: Self-pay | Admitting: "Endocrinology

## 2024-04-11 ENCOUNTER — Emergency Department

## 2024-04-11 ENCOUNTER — Inpatient Hospital Stay
Admission: EM | Admit: 2024-04-11 | Discharge: 2024-04-16 | DRG: 115 | Disposition: A | Discharge status: Home / Self Care | Attending: Internal Medicine | Admitting: Internal Medicine

## 2024-04-11 ENCOUNTER — Other Ambulatory Visit: Payer: Self-pay

## 2024-04-11 DIAGNOSIS — R609 Edema, unspecified: Secondary | ICD-10-CM | POA: Diagnosis not present

## 2024-04-11 DIAGNOSIS — M797 Fibromyalgia: Secondary | ICD-10-CM | POA: Diagnosis present

## 2024-04-11 DIAGNOSIS — E785 Hyperlipidemia, unspecified: Secondary | ICD-10-CM | POA: Diagnosis present

## 2024-04-11 DIAGNOSIS — E1140 Type 2 diabetes mellitus with diabetic neuropathy, unspecified: Secondary | ICD-10-CM | POA: Diagnosis not present

## 2024-04-11 DIAGNOSIS — R519 Headache, unspecified: Principal | ICD-10-CM

## 2024-04-11 DIAGNOSIS — Z794 Long term (current) use of insulin: Secondary | ICD-10-CM

## 2024-04-11 DIAGNOSIS — D84821 Immunodeficiency due to drugs: Secondary | ICD-10-CM | POA: Diagnosis present

## 2024-04-11 DIAGNOSIS — G8929 Other chronic pain: Secondary | ICD-10-CM | POA: Diagnosis present

## 2024-04-11 DIAGNOSIS — M792 Neuralgia and neuritis, unspecified: Secondary | ICD-10-CM | POA: Diagnosis not present

## 2024-04-11 DIAGNOSIS — G4733 Obstructive sleep apnea (adult) (pediatric): Secondary | ICD-10-CM | POA: Diagnosis not present

## 2024-04-11 DIAGNOSIS — T43505A Adverse effect of unspecified antipsychotics and neuroleptics, initial encounter: Secondary | ICD-10-CM | POA: Diagnosis not present

## 2024-04-11 DIAGNOSIS — E119 Type 2 diabetes mellitus without complications: Secondary | ICD-10-CM

## 2024-04-11 DIAGNOSIS — F1129 Opioid dependence with unspecified opioid-induced disorder: Secondary | ICD-10-CM | POA: Diagnosis present

## 2024-04-11 DIAGNOSIS — H469 Unspecified optic neuritis: Secondary | ICD-10-CM | POA: Diagnosis not present

## 2024-04-11 DIAGNOSIS — E1165 Type 2 diabetes mellitus with hyperglycemia: Secondary | ICD-10-CM | POA: Diagnosis not present

## 2024-04-11 DIAGNOSIS — G2111 Neuroleptic induced parkinsonism: Secondary | ICD-10-CM | POA: Diagnosis not present

## 2024-04-11 DIAGNOSIS — F319 Bipolar disorder, unspecified: Secondary | ICD-10-CM | POA: Diagnosis not present

## 2024-04-11 DIAGNOSIS — Z6839 Body mass index (BMI) 39.0-39.9, adult: Secondary | ICD-10-CM

## 2024-04-11 DIAGNOSIS — Z944 Liver transplant status: Secondary | ICD-10-CM

## 2024-04-11 DIAGNOSIS — M329 Systemic lupus erythematosus, unspecified: Secondary | ICD-10-CM | POA: Diagnosis not present

## 2024-04-11 DIAGNOSIS — M3500 Sicca syndrome, unspecified: Secondary | ICD-10-CM | POA: Diagnosis not present

## 2024-04-11 DIAGNOSIS — E1122 Type 2 diabetes mellitus with diabetic chronic kidney disease: Secondary | ICD-10-CM | POA: Diagnosis present

## 2024-04-11 DIAGNOSIS — T380X5A Adverse effect of glucocorticoids and synthetic analogues, initial encounter: Secondary | ICD-10-CM | POA: Diagnosis not present

## 2024-04-11 DIAGNOSIS — I129 Hypertensive chronic kidney disease with stage 1 through stage 4 chronic kidney disease, or unspecified chronic kidney disease: Secondary | ICD-10-CM | POA: Diagnosis present

## 2024-04-11 DIAGNOSIS — G4700 Insomnia, unspecified: Secondary | ICD-10-CM | POA: Diagnosis not present

## 2024-04-11 DIAGNOSIS — H47091 Other disorders of optic nerve, not elsewhere classified, right eye: Secondary | ICD-10-CM | POA: Diagnosis not present

## 2024-04-11 DIAGNOSIS — B309 Viral conjunctivitis, unspecified: Secondary | ICD-10-CM | POA: Diagnosis not present

## 2024-04-11 DIAGNOSIS — G43909 Migraine, unspecified, not intractable, without status migrainosus: Secondary | ICD-10-CM | POA: Diagnosis not present

## 2024-04-11 DIAGNOSIS — C8580 Other specified types of non-Hodgkin lymphoma, unspecified site: Secondary | ICD-10-CM | POA: Diagnosis present

## 2024-04-11 DIAGNOSIS — G500 Trigeminal neuralgia: Secondary | ICD-10-CM | POA: Diagnosis present

## 2024-04-11 DIAGNOSIS — G588 Other specified mononeuropathies: Secondary | ICD-10-CM | POA: Diagnosis not present

## 2024-04-11 DIAGNOSIS — C8519 Unspecified B-cell lymphoma, extranodal and solid organ sites: Secondary | ICD-10-CM | POA: Diagnosis not present

## 2024-04-11 DIAGNOSIS — E66813 Obesity, class 3: Secondary | ICD-10-CM | POA: Diagnosis present

## 2024-04-11 DIAGNOSIS — N183 Chronic kidney disease, stage 3 unspecified: Secondary | ICD-10-CM | POA: Diagnosis not present

## 2024-04-11 DIAGNOSIS — H5711 Ocular pain, right eye: Secondary | ICD-10-CM

## 2024-04-11 DIAGNOSIS — R739 Hyperglycemia, unspecified: Secondary | ICD-10-CM | POA: Diagnosis not present

## 2024-04-11 DIAGNOSIS — Z79621 Long term (current) use of calcineurin inhibitor: Secondary | ICD-10-CM

## 2024-04-11 DIAGNOSIS — H5710 Ocular pain, unspecified eye: Secondary | ICD-10-CM | POA: Diagnosis present

## 2024-04-11 DIAGNOSIS — I10 Essential (primary) hypertension: Secondary | ICD-10-CM | POA: Diagnosis present

## 2024-04-11 DIAGNOSIS — G894 Chronic pain syndrome: Secondary | ICD-10-CM | POA: Diagnosis not present

## 2024-04-11 DIAGNOSIS — Z7985 Long-term (current) use of injectable non-insulin antidiabetic drugs: Secondary | ICD-10-CM

## 2024-04-11 DIAGNOSIS — Z7984 Long term (current) use of oral hypoglycemic drugs: Secondary | ICD-10-CM

## 2024-04-11 LAB — SEDIMENTATION RATE: Sed Rate: 44 mm/h — ABNORMAL HIGH (ref 0–22)

## 2024-04-11 LAB — COMPREHENSIVE METABOLIC PANEL WITH GFR
ALT: 16 U/L (ref 0–44)
AST: 20 U/L (ref 15–41)
Albumin: 3.6 g/dL (ref 3.5–5.0)
Alkaline Phosphatase: 64 U/L (ref 38–126)
Anion gap: 10 (ref 5–15)
BUN: 13 mg/dL (ref 6–20)
CO2: 24 mmol/L (ref 22–32)
Calcium: 9 mg/dL (ref 8.9–10.3)
Chloride: 103 mmol/L (ref 98–111)
Creatinine, Ser: 0.92 mg/dL (ref 0.44–1.00)
GFR, Estimated: >60 mL/min (ref >60)
Glucose, Bld: 230 mg/dL — ABNORMAL HIGH (ref 70–99)
Potassium: 4.7 mmol/L (ref 3.5–5.1)
Sodium: 137 mmol/L (ref 135–145)
Total Bilirubin: 0.7 mg/dL (ref 0.0–1.2)
Total Protein: 7.1 g/dL (ref 6.5–8.1)

## 2024-04-11 LAB — CBC
HCT: 37.4 % (ref 36.0–46.0)
Hemoglobin: 12.2 g/dL (ref 12.0–15.0)
MCH: 28.4 pg (ref 26.0–34.0)
MCHC: 32.6 g/dL (ref 30.0–36.0)
MCV: 87 fL (ref 80.0–100.0)
Platelets: 161 K/uL (ref 150–400)
RBC: 4.3 MIL/uL (ref 3.87–5.11)
RDW: 14.8 % (ref 11.5–15.5)
WBC: 9.6 K/uL (ref 4.0–10.5)
nRBC: 0 % (ref 0.0–0.2)

## 2024-04-11 LAB — CBG MONITORING, ED: Glucose-Capillary: 210 mg/dL — ABNORMAL HIGH (ref 70–99)

## 2024-04-11 LAB — LIPASE, BLOOD: Lipase: 64 U/L — ABNORMAL HIGH (ref 11–51)

## 2024-04-11 MED ORDER — METOCLOPRAMIDE HCL 5 MG/ML IJ SOLN
10.0000 mg | Freq: Once | INTRAMUSCULAR | Status: AC
  Administered 2024-04-11: 10 mg via INTRAVENOUS
  Filled 2024-04-11: qty 2

## 2024-04-11 MED ORDER — SODIUM CHLORIDE 0.9 % IV SOLN
1000.0000 mg | Freq: Once | INTRAVENOUS | Status: AC
  Administered 2024-04-12: 1000 mg via INTRAVENOUS
  Filled 2024-04-11: qty 16

## 2024-04-11 MED ORDER — HYDROMORPHONE HCL 1 MG/ML IJ SOLN
0.5000 mg | INTRAMUSCULAR | Status: AC
  Administered 2024-04-11: 0.5 mg via INTRAVENOUS
  Filled 2024-04-11: qty 0.5

## 2024-04-11 MED ORDER — ONDANSETRON HCL 4 MG/2ML IJ SOLN
4.0000 mg | INTRAMUSCULAR | Status: AC
  Administered 2024-04-11: 4 mg via INTRAVENOUS
  Filled 2024-04-11: qty 2

## 2024-04-11 MED ORDER — PANTOPRAZOLE SODIUM 40 MG PO TBEC
40.0000 mg | DELAYED_RELEASE_TABLET | Freq: Every day | ORAL | Status: DC
  Administered 2024-04-12 – 2024-04-16 (×5): 40 mg via ORAL
  Filled 2024-04-11 (×5): qty 1

## 2024-04-11 MED ORDER — INSULIN ASPART 100 UNIT/ML IJ SOLN
0.0000 [IU] | Freq: Every day | INTRAMUSCULAR | Status: DC
  Administered 2024-04-12: 2 [IU] via SUBCUTANEOUS
  Administered 2024-04-12: 5 [IU] via SUBCUTANEOUS
  Administered 2024-04-13 – 2024-04-15 (×2): 4 [IU] via SUBCUTANEOUS
  Filled 2024-04-11 (×4): qty 1

## 2024-04-11 MED ORDER — INSULIN ASPART 100 UNIT/ML IJ SOLN
0.0000 [IU] | Freq: Three times a day (TID) | INTRAMUSCULAR | Status: DC
  Administered 2024-04-12: 20 [IU] via SUBCUTANEOUS
  Administered 2024-04-12: 11 [IU] via SUBCUTANEOUS
  Administered 2024-04-12: 15 [IU] via SUBCUTANEOUS
  Administered 2024-04-13 (×3): 20 [IU] via SUBCUTANEOUS
  Administered 2024-04-14: 4 [IU] via SUBCUTANEOUS
  Administered 2024-04-14: 20 [IU] via SUBCUTANEOUS
  Administered 2024-04-15 (×2): 15 [IU] via SUBCUTANEOUS
  Administered 2024-04-15: 4 [IU] via SUBCUTANEOUS
  Administered 2024-04-16: 3 [IU] via SUBCUTANEOUS
  Administered 2024-04-16: 4 [IU] via SUBCUTANEOUS
  Filled 2024-04-11 (×14): qty 1

## 2024-04-11 MED ORDER — INSULIN ASPART 100 UNIT/ML IJ SOLN
4.0000 [IU] | Freq: Three times a day (TID) | INTRAMUSCULAR | Status: DC
  Administered 2024-04-12: 4 [IU] via SUBCUTANEOUS
  Filled 2024-04-11: qty 1

## 2024-04-11 MED ORDER — IOHEXOL 300 MG/ML  SOLN: 75.0000 mL | Freq: Once | INTRAMUSCULAR | Status: DC | PRN

## 2024-04-11 NOTE — H&P (Signed)
 [facility] H&P   Patient Demographics:    Alison [name], is a 48 y.o. female  MRN: [id number]   DOB - August 01, 1976  Admit Date - 04/11/2024  Outpatient Primary MD for the patient is Gareth Mliss FALCON, FNP  Referring MD/NP/PA: Dr Dicky  Patient coming from: home  Chief Complaint  Patient presents with   Eye Pain   Headache      HPI:    Alison [name]  is a 48 y.o. female, with medical history significant for morbid obesity, lymphoma bilateral parotid gland s/p Rituxan  in 2020, diffuse large B-cell lymphoma,  lupus s/p liver transplant, currently on tacrolimus , neuropathy, major depressive disorder, sleep apnea. - Patient presents to ED secondary to complaints of right eye pain, and headache, patient reports symptoms been going on over the last 10 days, she does report history of migraine in the past, intermittent, much less intense, but reports this is different as it is associated with significant right eye pain, feel it in the back of her eyes, she denies any vision disturbances, or loss of vision, she denies fever, chills, chest pain or shortness of breath. - in ED her workup significant for elevated sed rate at 43, CT head with no acute findings, ED physician discussed with ophthalmology on-call Dr. Myrna, who requested MRI brain and right orbits due to concern for autoimmune process, MRI came back with finding concerning for temporal arteritis versus optic perineuritis, so [facility] hospitalist consulted to admit.  Review of systems:      A full 10 point Review of Systems was done, except as stated above, all other Review of Systems were negative.   With Past History of the following :    Past Medical History:  Diagnosis Date   Abnormal uterine bleeding    Allergy    Anxiety    Arthritis    Bipolar disorder (manic depression) (HCC)    Cancer (HCC) Diffuse B-Cell Lymphoma   2015    Chest pain    a. 09/2021 MV: Low risk without ischemia or infarct.  Normal EF.   Chronic kidney failure    Chronic renal disease, stage III (HCC)    Diabetes mellitus without complication (HCC)    DLBCL (diffuse large B cell lymphoma) (HCC) 2015   Right axillary lymph node resected and chemo tx's.   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 02/2023 Echo: EF 55-60%, no rwma, nl RV size/fxn. Mildly dil LA.   Personality disorder (HCC)    Pineal gland cyst    Post herpetic neuralgia    PTSD (post-traumatic stress disorder)    RBBB    Renal disorder    S/P liver transplant (HCC) 1993   Sleep apnea    has not recieved cpap yet-other one was recalled      Past Surgical History:  Procedure Laterality Date   BONE MARROW BIOPSY  01/13/2015   BREAST BIOPSY Right 12/2014   US  Axilla Bx, Positive for Lymphoma   BREAST BIOPSY Right 2011   benign   BREAST BIOPSY Right 2023   Stereo bx, X-clip, Fat Necrosis   BREAST BIOPSY Right 01/20/2024   US  RT BREAST BX W LOC DEV 1ST LESION IMG BX SPEC US  GUIDE 01/20/2024 [facility]   BREAST SURGERY Right 2016   Lymphoma, lymph node removal. pt then got a seroma and had to do more surgery   CHOLECYSTECTOMY     COLONOSCOPY     COLONOSCOPY WITH PROPOFOL  N/A 02/28/2022   Procedure: COLONOSCOPY WITH PROPOFOL ;  Surgeon: Jinny Carmine, MD;  Location: [facility] ENDOSCOPY;  Service: Endoscopy;  Laterality: N/A;   ESOPHAGOGASTRODUODENOSCOPY     ESOPHAGOGASTRODUODENOSCOPY (EGD) WITH PROPOFOL  N/A 01/09/2021   Procedure: ESOPHAGOGASTRODUODENOSCOPY (EGD) WITH  PROPOFOL ;  Surgeon: Jinny Carmine, MD;  Location: [facility] ENDOSCOPY;  Service: Endoscopy;  Laterality: N/A;   HERNIA REPAIR     x4-all from liver transplant   HYSTEROSCOPY WITH D & C N/A 11/13/2021   Procedure: DILATATION AND CURETTAGE /HYSTEROSCOPY;  Surgeon: Victor Claudell SAUNDERS, MD;  Location: [facility] ORS;  Service: Gynecology;  Laterality: N/A;   infusaport     LIVER TRANSPLANT  12/17/1991   LUMBAR PUNCTURE     PORT A CATH INJECTION ([facility] HX)     RENAL BIOPSY     tumor removal  2015   cancer right side      Social History:     Social History   Tobacco Use   Smoking status: Never    Passive exposure: Past   Smokeless tobacco: Never   Tobacco comments:    Occasional CBD oil  Substance Use Topics   Alcohol use: Not on file        Family History :     Family History  Problem Relation Age of Onset   Heart disease Mother    Hypertension Mother    Cancer - Other Mother    Bipolar disorder Mother    Anxiety disorder Mother    Arthritis Mother    Depression Mother    Heart disease Father    Hypertension Father    Diabetes Father    Parkinson's disease Maternal Grandmother    COPD Maternal Grandmother    Cancer Maternal Aunt    Cancer Maternal Uncle    Cancer Maternal Grandfather    Lupus Paternal Grandmother    Hypertension Brother    Alcohol abuse Brother    Depression Brother    Alcohol abuse Maternal Uncle       Home Medications:   Prior to Admission medications   Medication Sig Start Date End Date Taking? Authorizing Provider  Accu-Chek Softclix Lancets lancets 2 (two) times daily. 06/01/23   [provider]  albuterol  (VENTOLIN  HFA) 108 (90 Base) MCG/ACT inhaler Inhale 2 puffs into the lungs every 6 (six) hours as needed for wheezing or shortness of breath. 09/08/23  Gareth Mliss FALCON, FNP  baclofen  (LIORESAL ) 10 MG tablet Take 1 tablet by mouth 2 (two) times daily. 08/15/21   [provider]  benztropine  (COGENTIN ) 1 MG tablet TAKE 1 TABLET  BY MOUTH ONCE DAILY AS NEEDED FOR TREMORS 08/01/23   [name], [name], MD  Continuous Glucose Sensor (FREESTYLE LIBRE 3 PLUS SENSOR) MISC Change sensor every 15 days. 04/05/24   Motwani, Komal, MD  Cranberry-Vitamin C-Probiotic (AZO CRANBERRY PO) Take by mouth.    [provider]  fluticasone  (FLONASE ) 50 MCG/ACT nasal spray Place into the nose. 10/30/21   [provider]  glipiZIDE  (GLUCOTROL  XL) 10 MG 24 hr tablet TAKE 1 TABLET BY MOUTH IN THE MORNING AND AT BEDTIME 03/30/24   Motwani, Komal, MD  insulin  glargine (LANTUS  SOLOSTAR) 100 UNIT/ML Solostar Pen Inject 40 Units into the skin daily. 04/02/24   Pender, Julie F, FNP  Insulin  Pen Needle 32G X 6 MM MISC 1 each by Does not apply route as needed. 05/22/23   Gareth Mliss FALCON, FNP  lisinopril  (ZESTRIL ) 20 MG tablet TAKE 1 TABLET BY MOUTH ONCE DAILY *PATIENT WILL NEED TO SCHEDULE AN APPOINTMENT FOR ADDITIONAL REFILLS* 02/27/24   Gareth Mliss FALCON, FNP  lurasidone  (LATUDA ) 20 MG TABS tablet TAKE 1 TABLET BY MOUTH ONCE DAILY WITH SUPPER *REFILL REQUEST* 06/04/23   [name], [name], MD  magnesium  oxide (MAG-OX) 400 MG tablet Take 1,000 mg by mouth 2 (two) times daily.    [provider]  Melatonin 10 MG TABS Take 10 mg by mouth at bedtime. Patient taking differently: Take 10 mg by mouth as needed. As needed    [provider]  metFORMIN  (GLUCOPHAGE ) 1000 MG tablet TAKE 1 TABLET BY MOUTH TWICE DAILY WITH MEALS *PATIENT WILL NEED TO SCHEDULE APPOINTMENT FOR ADDITIONAL REFILLS* 02/27/24   Gareth Mliss FALCON, FNP  mirabegron  ER (MYRBETRIQ ) 50 MG TB24 tablet Take 1 tablet (50 mg total) by mouth daily. 01/22/23   Pender, Julie F, FNP  mirtazapine  (REMERON ) 15 MG tablet TAKE 1 TABLET BY MOUTH EVERY DAY AT BEDTIME *REFILL REQUEST* 06/04/23   [name], [name], MD  naloxone  (NARCAN ) nasal spray 4 mg/0.1 mL Place 0.4 mg into the nose once. 09/05/21   [provider]  NOVOLOG  FLEXPEN 100 UNIT/ML FlexPen Inject 0-30 Units into the skin 3  (three) times daily with meals. Sliding scale 01/21/23   [provider]  ondansetron  (ZOFRAN ) 4 MG tablet Take 1 tablet (4 mg total) by mouth every 6 (six) hours as needed for nausea. 10/16/23   Amin, Sumayya, MD  oxyCODONE  (OXY IR/ROXICODONE ) 5 MG immediate release tablet Take 5 mg by mouth 3 (three) times daily as needed.    [provider]  PRECISION QID TEST test strip  08/06/19   [provider]  pregabalin  (LYRICA ) 200 MG capsule Take 1 capsule (200 mg total) by mouth 2 (two) times daily. 12/03/21   Goodman, Graydon, MD  rosuvastatin  (CRESTOR ) 10 MG tablet Take 1 tablet (10 mg total) by mouth daily. 08/21/23   Darliss Rogue, MD  Semaglutide , 1 MG/DOSE, 4 MG/3ML SOPN Inject 1 mg into the skin once a week. 04/05/24   Motwani, Komal, MD  tacrolimus  (PROGRAF ) 1 MG capsule Take 1 capsule by mouth every morning. 02/20/23   [provider]  TYLENOL  PM EXTRA STRENGTH 1000-50 MG/30ML LIQD Take by mouth.    [provider]  UBRELVY  50 MG TABS Take 1 tablet by mouth daily as needed. 01/19/22   [provider]  venlafaxine  XR (  EFFEXOR -XR) 37.5 MG 24 hr capsule Take 37.5 mg by mouth every morning. 12/01/23   [provider]  venlafaxine  XR (EFFEXOR -XR) 75 MG 24 hr capsule Take 1 capsule (75 mg total) by mouth daily with breakfast. Take along with 37.5 mg daily 09/09/23   [name], [name], MD     Allergies:     Allergies  Allergen Reactions   Penicillin G Itching, Rash, Anaphylaxis and Palpitations   Lamictal  [Lamotrigine ] Rash   Tape    Carbamazepine Other (See Comments)    Medication interaction-prograf     Hydrocodone-Acetaminophen  Itching   Naproxen Itching   Penicillins    Vancomycin  Rash    Macular/blotchy rash at infusion site     Physical Exam:   Vitals  Blood pressure 126/76, pulse 64, temperature 98.1 F (36.7 C), temperature source Oral, resp. rate (!) 23, height 5' 6 (1.676 m), weight 111.1 kg, last menstrual period  10/16/2021, SpO2 97%.   1. General Well-developed female, with morbid obesity, laying in bed in mild discomfort, likes this day in the dark due to photophobia  2. Normal affect and insight, Not Suicidal or Homicidal, Awake Alert, Oriented X 3.  3. No F.N deficits, ALL C.Nerves Intact, Strength 5/5 all 4 extremities, Sensation intact all 4 extremities, Plantars down going.  4. Ears and Eyes appear Normal, Conjunctivae clear, PERRLA. Moist Oral Mucosa.  5. Supple Neck, No JVD, No cervical lymphadenopathy appriciated, No Carotid Bruits.  6. Symmetrical Chest wall movement, Good air movement bilaterally, CTAB.  7. RRR, No Gallops, Rubs or Murmurs, No Parasternal Heave.  8. Positive Bowel Sounds, Abdomen Soft, No tenderness, No organomegaly appriciated,No rebound -guarding or rigidity.  9.  No Cyanosis, Normal Skin Turgor, No Skin Rash or Bruise.  10. Good muscle tone,  joints appear normal , no effusions, Normal ROM.     Data Review:    CBC Recent Labs  Lab 04/11/24 2008  WBC 9.6  HGB 12.2  HCT 37.4  PLT 161  MCV 87.0  MCH 28.4  MCHC 32.6  RDW 14.8   ------------------------------------------------------------------------------------------------------------------  Chemistries  Recent Labs  Lab 04/11/24 2008  NA 137  K 4.7  CL 103  CO2 24  GLUCOSE 230*  BUN 13  CREATININE 0.92  CALCIUM  9.0  AST 20  ALT 16  ALKPHOS 64  BILITOT 0.7   ------------------------------------------------------------------------------------------------------------------ estimated creatinine clearance is 92.4 mL/min (by C-G formula based on SCr of 0.92 mg/dL). ------------------------------------------------------------------------------------------------------------------ No results for input(s): TSH, T4TOTAL, T3FREE, THYROIDAB in the last 72 hours.  Invalid input(s): FREET3  Coagulation profile No results for input(s): INR, PROTIME in the last 168  hours. ------------------------------------------------------------------------------------------------------------------- No results for input(s): DDIMER in the last 72 hours. -------------------------------------------------------------------------------------------------------------------  Cardiac Enzymes No results for input(s): CKMB, TROPONINI, MYOGLOBIN in the last 168 hours.  Invalid input(s): CK ------------------------------------------------------------------------------------------------------------------ No results found for: BNP   ---------------------------------------------------------------------------------------------------------------  Urinalysis    Component Value Date/Time   COLORURINE YELLOW (A) 03/29/2024 0941   APPEARANCEUR CLEAR (A) 03/29/2024 0941   APPEARANCEUR Clear 02/12/2022 1125   LABSPEC 1.019 03/29/2024 0941   PHURINE 5.0 03/29/2024 0941   GLUCOSEU 150 (A) 03/29/2024 0941   HGBUR NEGATIVE 03/29/2024 0941   BILIRUBINUR NEGATIVE 03/29/2024 0941   BILIRUBINUR neg 02/25/2024 1440   BILIRUBINUR Negative 02/12/2022 1125   KETONESUR NEGATIVE 03/29/2024 0941   PROTEINUR 100 (A) 03/29/2024 0941   UROBILINOGEN 0.2 02/25/2024 1440   NITRITE NEGATIVE 03/29/2024 0941   LEUKOCYTESUR NEGATIVE 03/29/2024 0941    ----------------------------------------------------------------------------------------------------------------   Imaging Results:  MR BRAIN WO CONTRAST Result Date: 04/11/2024 CLINICAL DATA:  Abdominal plegia and headache.  Right eye pain. EXAM: MRI HEAD AND ORBITS WITHOUT CONTRAST TECHNIQUE: Multiplanar, multi-echo pulse sequences of the brain and surrounding structures were acquired without intravenous contrast. Multiplanar, multi-echo pulse sequences of the orbits and surrounding structures were acquired including fat saturation techniques, without intravenous contrast administration. COMPARISON:  05/29/2022 FINDINGS: MRI HEAD  FINDINGS Brain: No acute infarct, mass effect or extra-axial collection. No chronic microhemorrhage or siderosis. Normal white matter signal, parenchymal volume and CSF spaces. The midline structures are normal. Vascular: Normal flow voids. Skull and upper cervical spine: Normal marrow signal. Other: None. MRI ORBITS FINDINGS Orbits: There is mild edema within the intraconal fat of the right orbit. Otherwise, the orbits are normal. Visualized sinuses: Small amount of bilateral mastoid fluid. Paranasal sinuses are clear. Soft tissues: Normal. IMPRESSION: 1. Mild edema within the intraconal fat of the right orbit, compatible with optic perineuritis. This finding may also be associated with temporal arteritis. 2. Normal MRI of the brain. Electronically Signed   By: Franky Stanford M.D.   On: 04/11/2024 22:45   MR ORBITS WO CONTRAST Result Date: 04/11/2024 CLINICAL DATA:  Abdominal plegia and headache.  Right eye pain. EXAM: MRI HEAD AND ORBITS WITHOUT CONTRAST TECHNIQUE: Multiplanar, multi-echo pulse sequences of the brain and surrounding structures were acquired without intravenous contrast. Multiplanar, multi-echo pulse sequences of the orbits and surrounding structures were acquired including fat saturation techniques, without intravenous contrast administration. COMPARISON:  05/29/2022 FINDINGS: MRI HEAD FINDINGS Brain: No acute infarct, mass effect or extra-axial collection. No chronic microhemorrhage or siderosis. Normal white matter signal, parenchymal volume and CSF spaces. The midline structures are normal. Vascular: Normal flow voids. Skull and upper cervical spine: Normal marrow signal. Other: None. MRI ORBITS FINDINGS Orbits: There is mild edema within the intraconal fat of the right orbit. Otherwise, the orbits are normal. Visualized sinuses: Small amount of bilateral mastoid fluid. Paranasal sinuses are clear. Soft tissues: Normal. IMPRESSION: 1. Mild edema within the intraconal fat of the right orbit,  compatible with optic perineuritis. This finding may also be associated with temporal arteritis. 2. Normal MRI of the brain. Electronically Signed   By: Franky Stanford M.D.   On: 04/11/2024 22:45   CT Head Wo Contrast Result Date: 04/11/2024 CLINICAL DATA:  headache EXAM: CT HEAD WITHOUT CONTRAST TECHNIQUE: Contiguous axial images were obtained from the base of the skull through the vertex without intravenous contrast. RADIATION DOSE REDUCTION: This exam was performed according to the departmental dose-optimization program which includes automated exposure control, adjustment of the mA and/or kV according to patient size and/or use of iterative reconstruction technique. COMPARISON:  MRI head 05/29/2022. FINDINGS: Brain: No evidence of acute infarction, hemorrhage, hydrocephalus, extra-axial collection or mass lesion/mass effect. Chronic partially empty sella. Vascular: No hyperdense vessel or unexpected calcification. Skull: No acute fracture. Sinuses/Orbits: Clear sinuses.  No acute orbital finding. IMPRESSION: No evidence of acute intracranial abnormality. Electronically Signed   By: Gilmore GORMAN Molt M.D.   On: 04/11/2024 16:51      Assessment & Plan:    Principal Problem:   Eye pain Active Problems:   Systemic lupus erythematosus (SLE) in adult [facility])   Trigeminal neuralgia   Migraine   OSA on CPAP   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HCC)   Morbid obesity (HCC)   Neuropathic pain   S/P liver transplant (HCC)   Neuroleptic induced parkinsonism (HCC)   Sjogren's syndrome (HCC)   Right eye pain,/headache - Intermittent  over the last 10 days, elevated ESR and CRP - MRI orbits significant for inflammation concerning for either temporal arteritis versus optic perineuritis. - Will give 1 dose of IV Solu-Medrol  1 g for now pending further workup, consult evaluation. -Consult placed for vascular surgery for temporal artery biopsy. - Consult placed for neurology regarding further recommendations. -  Consult placed for ophthalmology as well - Will keep on Protonix  for GI prophylaxis while on steroids  Diabetes mellitus, type II with hyperglycemia - Continue with home dose Basaglar  (here on Semglee ), will add insulin  sliding scale and Premeal NovoLog  as she is on steroids  Insomnia Continue Home melatonin 10 mg nightly    Opioid dependence with opioid-induced disorder (HCC) Continue home meds   Obesity class II -Body mass index is 39.54 kg/m.    SLE S/P liver transplant (HCC) Lymphoma bilateral parotid gland s/p Rituxan  in 2020,  History of diffuse large B-cell lymphoma,  Continue tacrolimus  She is following with oncology as an outpatient  Trigeminal neuralgia Fibromyalgia  Migraine -Continue with Lyrica  and venlafaxine   OSA on CPAP CPAP nightly ordered   Hyperlipidemia Continue home medication   DVT Prophylaxis Heparin   AM Labs Ordered, also please review Full Orders  Family Communication: Admission, patients condition and plan of care including tests being ordered have been discussed with the patient  who indicate understanding and agree with the plan and Code Status.  Code Status Full  Likely DC to  home  Consults called: E D/W ophthalmology Dr myrna, messages sent for neuro and vascular surgery regarding consults    Admission status: inpatent    Time spent in minutes : 75 minutes   Brayton Lye M.D on 04/11/2024 at 11:57 PM   [facility] - Office  [phone]

## 2024-04-11 NOTE — H&P (Incomplete)
 TRH H&P   Patient Demographics:    Alison [name], is a 48 y.o. female  MRN: [id number]   DOB - 1976/05/12  Admit Date - 04/11/2024  Outpatient Primary MD for the patient is Alison Mliss FALCON, FNP  Referring MD/NP/PA: ***  Outpatient Specialists: ***    Patient coming from: ***  Chief Complaint  Patient presents with  . Eye Pain  . Headache      HPI:    Alison [name]  is a 48 y.o. female, ******    Review of systems:    In addition to the HPI above, **** No Fever-chills, No Headache, No changes with Vision or hearing, No problems swallowing food or Liquids, No Chest pain, Cough or Shortness of Breath, No Abdominal pain, No Nausea or Vommitting, Bowel movements are regular, No Blood in stool or Urine, No dysuria, No new skin rashes or bruises, No new joints pains-aches,  No new weakness, tingling, numbness in any extremity, No recent weight gain or loss, No polyuria, polydypsia or polyphagia, No significant Mental Stressors.  A full 10 point Review of Systems was done, except as stated above, all other Review of Systems were negative.   With Past History of the following :    Past Medical History:  Diagnosis Date  . Abnormal uterine bleeding   . Allergy   . Anxiety   . Arthritis   . Bipolar disorder (manic depression) (HCC)   . Cancer (HCC) Diffuse B-Cell Lymphoma   2015  . Chest pain    a. 09/2021 MV: Low risk without ischemia or infarct.  Normal EF.  SABRA Chronic kidney failure   . Chronic renal disease, stage III (HCC)   . Diabetes mellitus without complication (HCC)   . DLBCL (diffuse large B cell lymphoma) (HCC) 2015   Right axillary lymph node resected and chemo tx's.  SABRA Dyspnea   . FH: trigeminal neuralgia   . GERD (gastroesophageal reflux disease)   . Heart murmur   . Hepatic cirrhosis (HCC)   . History of kidney stones   . Hypertension    . Kidney mass   . Long Q-T syndrome   . Lupoid hepatitis (HCC)   . Lupus   . Major depressive disorder   . 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 Migraine   . Morbid obesity (HCC)   . Neuromuscular disorder (HCC)    neuropathy  . Neuropathy   . Pericarditis    a. 02/2023 Echo: EF 55-60%, no rwma, nl RV size/fxn. Mildly dil LA.  SABRA Personality disorder (HCC)   . Pineal gland cyst   . Post herpetic neuralgia   . PTSD (post-traumatic stress disorder)   . RBBB   . Renal disorder   . S/P liver transplant (HCC) 1993  . Sleep apnea    has not recieved cpap yet-other one was recalled  Past Surgical History:  Procedure Laterality Date  . BONE MARROW BIOPSY  01/13/2015  . BREAST BIOPSY Right 12/2014   US  Axilla Bx, Positive for Lymphoma  . BREAST BIOPSY Right 2011   benign  . BREAST BIOPSY Right 2023   Stereo bx, X-clip, Fat Necrosis  . BREAST BIOPSY Right 01/20/2024   US  RT BREAST BX W LOC DEV 1ST LESION IMG BX SPEC US  GUIDE 01/20/2024 [facility]  . BREAST SURGERY Right 2016   Lymphoma, lymph node removal. pt then got a seroma and had to do more surgery  . CHOLECYSTECTOMY    . COLONOSCOPY    . COLONOSCOPY WITH PROPOFOL  N/A 02/28/2022   Procedure: COLONOSCOPY WITH PROPOFOL ;  Surgeon: Jinny Carmine, MD;  Location: [facility] ENDOSCOPY;  Service: Endoscopy;  Laterality: N/A;  . ESOPHAGOGASTRODUODENOSCOPY    . ESOPHAGOGASTRODUODENOSCOPY (EGD) WITH PROPOFOL  N/A 01/09/2021   Procedure: ESOPHAGOGASTRODUODENOSCOPY (EGD) WITH PROPOFOL ;  Surgeon: Jinny Carmine, MD;  Location: [facility] ENDOSCOPY;  Service: Endoscopy;  Laterality: N/A;  . HERNIA REPAIR     x4-all from liver transplant  . HYSTEROSCOPY WITH D & C N/A 11/13/2021   Procedure: DILATATION AND CURETTAGE /HYSTEROSCOPY;  Surgeon: Victor Claudell SAUNDERS, MD;  Location: [facility] ORS;  Service: Gynecology;  Laterality: N/A;  . infusaport    . LIVER TRANSPLANT  12/17/1991  . LUMBAR PUNCTURE    . PORT A CATH INJECTION  ([facility] HX)    . RENAL BIOPSY    . tumor removal  2015   cancer right side      Social History:     Social History   Tobacco Use  . Smoking status: Never    Passive exposure: Past  . Smokeless tobacco: Never  . Tobacco comments:    Occasional CBD oil  Substance Use Topics  . Alcohol use: Not on file     Lives -   Mobility -   ********   Family History :     Family History  Problem Relation Age of Onset  . Heart disease Mother   . Hypertension Mother   . Cancer - Other Mother   . Bipolar disorder Mother   . Anxiety disorder Mother   . Arthritis Mother   . Depression Mother   . Heart disease Father   . Hypertension Father   . Diabetes Father   . Parkinson's disease Maternal Grandmother   . COPD Maternal Grandmother   . Cancer Maternal Aunt   . Cancer Maternal Uncle   . Cancer Maternal Grandfather   . Lupus Paternal Grandmother   . Hypertension Brother   . Alcohol abuse Brother   . Depression Brother   . Alcohol abuse Maternal Uncle    ********   Home Medications:   Prior to Admission medications   Medication Sig Start Date End Date Taking? Authorizing Provider  Accu-Chek Softclix Lancets lancets 2 (two) times daily. 06/01/23   [provider]  albuterol  (VENTOLIN  HFA) 108 (90 Base) MCG/ACT inhaler Inhale 2 puffs into the lungs every 6 (six) hours as needed for wheezing or shortness of breath. 09/08/23   Pender, Julie F, FNP  baclofen  (LIORESAL ) 10 MG tablet Take 1 tablet by mouth 2 (two) times daily. 08/15/21   [provider]  benztropine  (COGENTIN ) 1 MG tablet TAKE 1 TABLET BY MOUTH ONCE DAILY AS NEEDED FOR TREMORS 08/01/23   [name], [name], MD  Continuous Glucose Sensor (FREESTYLE LIBRE 3 PLUS SENSOR) MISC Change sensor every 15 days. 04/05/24   Motwani, Komal, MD  Cranberry-Vitamin C-Probiotic (  AZO CRANBERRY PO) Take by mouth.    [provider]  fluticasone  (FLONASE ) 50 MCG/ACT nasal spray Place into the nose. 10/30/21    [provider]  glipiZIDE  (GLUCOTROL  XL) 10 MG 24 hr tablet TAKE 1 TABLET BY MOUTH IN THE MORNING AND AT BEDTIME 03/30/24   Motwani, Komal, MD  insulin  glargine (LANTUS  SOLOSTAR) 100 UNIT/ML Solostar Pen Inject 40 Units into the skin daily. 04/02/24   Pender, Julie F, FNP  Insulin  Pen Needle 32G X 6 MM MISC 1 each by Does not apply route as needed. 05/22/23   Alison Mliss FALCON, FNP  lisinopril  (ZESTRIL ) 20 MG tablet TAKE 1 TABLET BY MOUTH ONCE DAILY *PATIENT WILL NEED TO SCHEDULE AN APPOINTMENT FOR ADDITIONAL REFILLS* 02/27/24   Alison Mliss FALCON, FNP  lurasidone  (LATUDA ) 20 MG TABS tablet TAKE 1 TABLET BY MOUTH ONCE DAILY WITH SUPPER *REFILL REQUEST* 06/04/23   [name], [name], MD  magnesium  oxide (MAG-OX) 400 MG tablet Take 1,000 mg by mouth 2 (two) times daily.    [provider]  Melatonin 10 MG TABS Take 10 mg by mouth at bedtime. Patient taking differently: Take 10 mg by mouth as needed. As needed    [provider]  metFORMIN  (GLUCOPHAGE ) 1000 MG tablet TAKE 1 TABLET BY MOUTH TWICE DAILY WITH MEALS *PATIENT WILL NEED TO SCHEDULE APPOINTMENT FOR ADDITIONAL REFILLS* 02/27/24   Alison Mliss FALCON, FNP  mirabegron  ER (MYRBETRIQ ) 50 MG TB24 tablet Take 1 tablet (50 mg total) by mouth daily. 01/22/23   Pender, Julie F, FNP  mirtazapine  (REMERON ) 15 MG tablet TAKE 1 TABLET BY MOUTH EVERY DAY AT BEDTIME *REFILL REQUEST* 06/04/23   [name], [name], MD  naloxone  (NARCAN ) nasal spray 4 mg/0.1 mL Place 0.4 mg into the nose once. 09/05/21   [provider]  NOVOLOG  FLEXPEN 100 UNIT/ML FlexPen Inject 0-30 Units into the skin 3 (three) times daily with meals. Sliding scale 01/21/23   [provider]  ondansetron  (ZOFRAN ) 4 MG tablet Take 1 tablet (4 mg total) by mouth every 6 (six) hours as needed for nausea. 10/16/23   Amin, Sumayya, MD  oxyCODONE  (OXY IR/ROXICODONE ) 5 MG immediate release tablet Take 5 mg by mouth 3 (three) times daily as needed.    [provider]   PRECISION QID TEST test strip  08/06/19   [provider]  pregabalin  (LYRICA ) 200 MG capsule Take 1 capsule (200 mg total) by mouth 2 (two) times daily. 12/03/21   Goodman, Graydon, MD  rosuvastatin  (CRESTOR ) 10 MG tablet Take 1 tablet (10 mg total) by mouth daily. 08/21/23   Darliss Rogue, MD  Semaglutide , 1 MG/DOSE, 4 MG/3ML SOPN Inject 1 mg into the skin once a week. 04/05/24   Motwani, Komal, MD  tacrolimus  (PROGRAF ) 1 MG capsule Take 1 capsule by mouth every morning. 02/20/23   [provider]  TYLENOL  PM EXTRA STRENGTH 1000-50 MG/30ML LIQD Take by mouth.    [provider]  UBRELVY  50 MG TABS Take 1 tablet by mouth daily as needed. 01/19/22   [provider]  venlafaxine  XR (EFFEXOR -XR) 37.5 MG 24 hr capsule Take 37.5 mg by mouth every morning. 12/01/23   [provider]  venlafaxine  XR (EFFEXOR -XR) 75 MG 24 hr capsule Take 1 capsule (75 mg total) by mouth daily with breakfast. Take along with 37.5 mg daily 09/09/23   [name], [name], MD     Allergies:     Allergies  Allergen Reactions  . Penicillin G Itching, Rash, Anaphylaxis and  Palpitations  . Lamictal  [Lamotrigine ] Rash  . Tape   . Carbamazepine Other (See Comments)    Medication interaction-prograf    . Hydrocodone-Acetaminophen  Itching  . Naproxen Itching  . Penicillins   . Vancomycin  Rash    Macular/blotchy rash at infusion site     Physical Exam:   Vitals  Blood pressure 126/76, pulse 64, temperature 98.1 F (36.7 C), temperature source Oral, resp. rate (!) 23, height 5' 6 (1.676 m), weight 111.1 kg, last menstrual period 10/16/2021, SpO2 97%.   1. General ******* lying in bed in NAD,  ***********  2. Normal affect and insight, Not Suicidal or Homicidal, Awake Alert, Oriented X 3.  3. No F.N deficits, ALL C.Nerves Intact, Strength 5/5 all 4 extremities, Sensation intact all 4 extremities, Plantars down going.  4. Ears and Eyes appear Normal, Conjunctivae clear,  PERRLA. Moist Oral Mucosa.  5. Supple Neck, No JVD, No cervical lymphadenopathy appriciated, No Carotid Bruits.  6. Symmetrical Chest wall movement, Good air movement bilaterally, CTAB.  7. RRR, No Gallops, Rubs or Murmurs, No Parasternal Heave.  8. Positive Bowel Sounds, Abdomen Soft, No tenderness, No organomegaly appriciated,No rebound -guarding or rigidity.  9.  No Cyanosis, Normal Skin Turgor, No Skin Rash or Bruise.  10. Good muscle tone,  joints appear normal , no effusions, Normal ROM.  11. No Palpable Lymph Nodes in Neck or Axillae  **************   Data Review:    CBC Recent Labs  Lab 04/11/24 2008  WBC 9.6  HGB 12.2  HCT 37.4  PLT 161  MCV 87.0  MCH 28.4  MCHC 32.6  RDW 14.8   ------------------------------------------------------------------------------------------------------------------  Chemistries  Recent Labs  Lab 04/11/24 2008  NA 137  K 4.7  CL 103  CO2 24  GLUCOSE 230*  BUN 13  CREATININE 0.92  CALCIUM  9.0  AST 20  ALT 16  ALKPHOS 64  BILITOT 0.7   ------------------------------------------------------------------------------------------------------------------ estimated creatinine clearance is 92.4 mL/min (by C-G formula based on SCr of 0.92 mg/dL). ------------------------------------------------------------------------------------------------------------------ No results for input(s): TSH, T4TOTAL, T3FREE, THYROIDAB in the last 72 hours.  Invalid input(s): FREET3  Coagulation profile No results for input(s): INR, PROTIME in the last 168 hours. ------------------------------------------------------------------------------------------------------------------- No results for input(s): DDIMER in the last 72 hours. -------------------------------------------------------------------------------------------------------------------  Cardiac Enzymes No results for input(s): CKMB, TROPONINI, MYOGLOBIN in the last 168  hours.  Invalid input(s): CK ------------------------------------------------------------------------------------------------------------------ No results found for: BNP   ---------------------------------------------------------------------------------------------------------------  Urinalysis    Component Value Date/Time   COLORURINE YELLOW (A) 03/29/2024 0941   APPEARANCEUR CLEAR (A) 03/29/2024 0941   APPEARANCEUR Clear 02/12/2022 1125   LABSPEC 1.019 03/29/2024 0941   PHURINE 5.0 03/29/2024 0941   GLUCOSEU 150 (A) 03/29/2024 0941   HGBUR NEGATIVE 03/29/2024 0941   BILIRUBINUR NEGATIVE 03/29/2024 0941   BILIRUBINUR neg 02/25/2024 1440   BILIRUBINUR Negative 02/12/2022 1125   KETONESUR NEGATIVE 03/29/2024 0941   PROTEINUR 100 (A) 03/29/2024 0941   UROBILINOGEN 0.2 02/25/2024 1440   NITRITE NEGATIVE 03/29/2024 0941   LEUKOCYTESUR NEGATIVE 03/29/2024 0941    ----------------------------------------------------------------------------------------------------------------   Imaging Results:    MR BRAIN WO CONTRAST Result Date: 04/11/2024 CLINICAL DATA:  Abdominal plegia and headache.  Right eye pain. EXAM: MRI HEAD AND ORBITS WITHOUT CONTRAST TECHNIQUE: Multiplanar, multi-echo pulse sequences of the brain and surrounding structures were acquired without intravenous contrast. Multiplanar, multi-echo pulse sequences of the orbits and surrounding structures were acquired including fat saturation techniques, without intravenous contrast administration. COMPARISON:  05/29/2022 FINDINGS: MRI HEAD FINDINGS Brain: No acute  infarct, mass effect or extra-axial collection. No chronic microhemorrhage or siderosis. Normal white matter signal, parenchymal volume and CSF spaces. The midline structures are normal. Vascular: Normal flow voids. Skull and upper cervical spine: Normal marrow signal. Other: None. MRI ORBITS FINDINGS Orbits: There is mild edema within the intraconal fat of the right  orbit. Otherwise, the orbits are normal. Visualized sinuses: Small amount of bilateral mastoid fluid. Paranasal sinuses are clear. Soft tissues: Normal. IMPRESSION: 1. Mild edema within the intraconal fat of the right orbit, compatible with optic perineuritis. This finding may also be associated with temporal arteritis. 2. Normal MRI of the brain. Electronically Signed   By: Franky Stanford M.D.   On: 04/11/2024 22:45   MR ORBITS WO CONTRAST Result Date: 04/11/2024 CLINICAL DATA:  Abdominal plegia and headache.  Right eye pain. EXAM: MRI HEAD AND ORBITS WITHOUT CONTRAST TECHNIQUE: Multiplanar, multi-echo pulse sequences of the brain and surrounding structures were acquired without intravenous contrast. Multiplanar, multi-echo pulse sequences of the orbits and surrounding structures were acquired including fat saturation techniques, without intravenous contrast administration. COMPARISON:  05/29/2022 FINDINGS: MRI HEAD FINDINGS Brain: No acute infarct, mass effect or extra-axial collection. No chronic microhemorrhage or siderosis. Normal white matter signal, parenchymal volume and CSF spaces. The midline structures are normal. Vascular: Normal flow voids. Skull and upper cervical spine: Normal marrow signal. Other: None. MRI ORBITS FINDINGS Orbits: There is mild edema within the intraconal fat of the right orbit. Otherwise, the orbits are normal. Visualized sinuses: Small amount of bilateral mastoid fluid. Paranasal sinuses are clear. Soft tissues: Normal. IMPRESSION: 1. Mild edema within the intraconal fat of the right orbit, compatible with optic perineuritis. This finding may also be associated with temporal arteritis. 2. Normal MRI of the brain. Electronically Signed   By: Franky Stanford M.D.   On: 04/11/2024 22:45   CT Head Wo Contrast Result Date: 04/11/2024 CLINICAL DATA:  headache EXAM: CT HEAD WITHOUT CONTRAST TECHNIQUE: Contiguous axial images were obtained from the base of the skull through the vertex  without intravenous contrast. RADIATION DOSE REDUCTION: This exam was performed according to the departmental dose-optimization program which includes automated exposure control, adjustment of the mA and/or kV according to patient size and/or use of iterative reconstruction technique. COMPARISON:  MRI head 05/29/2022. FINDINGS: Brain: No evidence of acute infarction, hemorrhage, hydrocephalus, extra-axial collection or mass lesion/mass effect. Chronic partially empty sella. Vascular: No hyperdense vessel or unexpected calcification. Skull: No acute fracture. Sinuses/Orbits: Clear sinuses.  No acute orbital finding. IMPRESSION: No evidence of acute intracranial abnormality. Electronically Signed   By: Gilmore GORMAN Molt M.D.   On: 04/11/2024 16:51    My personal review of EKG: Rhythm NSR, Rate  ** /min, QTc *** , no Acute ST changes   Assessment & Plan:    Principal Problem:   Eye pain Active Problems:   Systemic lupus erythematosus (SLE) in adult [facility])   Trigeminal neuralgia   Migraine   OSA on CPAP   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HCC)   Morbid obesity (HCC)   Neuropathic pain   S/P liver transplant (HCC)   Neuroleptic induced parkinsonism (HCC)   Sjogren's syndrome (HCC)     DVT Prophylaxis Heparin   AM Labs Ordered, also please review Full Orders  Family Communication: Admission, patients condition and plan of care including tests being ordered have been discussed with the patient  who indicate understanding and agree with the plan and Code Status.  Code Status Full  Likely DC to  home  Consults  called: E D/W ophthalmology Dr myrna, messages sent for neuro and vascular surgery regarding consults    Admission status: inpatent    Time spent in minutes : 75 minutes   Brayton Lye M.D on 04/11/2024 at 11:57 PM   [facility] - Office  [phone]

## 2024-04-11 NOTE — ED Triage Notes (Addendum)
 Pt comes with c/o headache today and right eye pain for couple weeks now. Pt went to UC and was sent here to get checked out. Pt states maybe cluster headache. Pt states nausea and did throw up today. Pt denies any recent falls.
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
## 2024-04-11 NOTE — Progress Notes (Signed)
 Right eye pain for 1 week
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
## 2024-04-11 NOTE — ED Notes (Addendum)
 Patient still c/o nausea and photosensitivity. Pain has improved. Patient states someone will pick her up when she is discharged. Patient ambulated to hallway bathroom with a steady gait. Dr. Dicky informed.

## 2024-04-11 NOTE — ED Notes (Signed)
 Patient is out of the room. Will medicate upon return.

## 2024-04-11 NOTE — Progress Notes (Addendum)
 Subjective Patient ID: Alison [name] is a 48 y.o. female.    Alison [name] is a 48 y.o. female w/ lupus who presents ot the clinic for evaluation of right eye pain onset x 1 week ago, gradually worsened. Denies any visual disturbances. Notes that it feels like a neuralgia due to her hx of trigeminal neuralgia. Denies any recent head injury. Denies any eye injury. Denies any FB sensation.    History provided by:  Patient Language interpreter used: No     Review of Systems  Constitutional:  Negative for chills, fatigue and fever.  HENT:  Negative for congestion, rhinorrhea and sore throat.   Eyes:  Positive for photophobia, pain and discharge (clear, tearing). Negative for redness, itching and visual disturbance.  Respiratory:  Negative for cough.   Gastrointestinal:  Positive for vomiting (x 2 episodes). Negative for abdominal pain, diarrhea and nausea.  Musculoskeletal:  Negative for myalgias.  Skin:  Negative for rash.  Neurological:  Negative for dizziness and headaches.    Patient History  Allergies: Allergies  Allergen Reactions  . Amoxicillin Itching  . Lamotrigine  Rash  . Carbamazepine Other    med interaction med interaction Medication interaction-prograf  med interaction med interaction Medication interaction-prograf   . Hydrocodone-Acetaminophen  Itching  . Naproxen Itching  . Vancomycin  Rash    Macular/blotchy rash at infusion site    Past Medical History:  Diagnosis Date  . Arthritis   . Cancer (CMS/HCC)   . Diabetes mellitus   . H/O Sjogren's disease (CMS/HCC)   . Hypertension   . Immune deficiency disorder (CMS/HCC)   . Lupus   . Renal disorder    History reviewed. No pertinent surgical history. Social History   Socioeconomic History  . Marital status: Single    Spouse name: Not on file  . Number of children: Not on file  . Years of education: Not on file  . Highest education level: Not on file  Occupational History  . Not on file   Tobacco Use  . Smoking status: Never  . Smokeless tobacco: Never  Vaping Use  . Vaping status: Never Used  Substance and Sexual Activity  . Alcohol use: Never  . Drug use: Never  . Sexual activity: Yes    Partners: Male  Other Topics Concern  . Not on file  Social History Narrative  . Not on file   History reviewed. No pertinent family history. Current Outpatient Medications on File Prior to Visit  Medication Sig Dispense Refill  . glipiZIDE  XL (Glucotrol  XL) 10 MG 24 hr tablet TAKE 1 TABLET BY MOUTH IN THE MORNING AND AT BEDTIME    . Lantus  SoloStar 100 UNIT/ML injection Inject 40 Units under the skin.    . lurasidone  (Latuda ) 20 MG tablet TAKE 1 TABLET BY MOUTH ONCE DAILY WITH SUPPER *REFILL REQUEST*    . mirtazapine  (Remeron ) 15 MG tablet TAKE 1 TABLET BY MOUTH EVERY DAY AT BEDTIME *REFILL REQUEST*    . rosuvastatin  (Crestor ) 10 MG tablet Take 10 mg by mouth.    . Semaglutide , 1 MG/DOSE, 4 MG/3ML solution pen-injector Inject 1 mg under the skin.    . venlafaxine  XR (Effoxor-XR) 37.5 MG 24 hr capsule Take 37.5 mg by mouth.    . baclofen  (Lioresal ) 20 MG tablet Take 20 mg by mouth.    . benztropine  (Cogentin ) 0.5 MG tablet Take 0.5 mg by mouth.    . DULoxetine  (Cymbalta ) 60 MG DR capsule Take 60 mg by mouth.    . hydrOXYzine  pamoate (Vistaril )  25 MG capsule Take 25 mg by mouth.    . liraglutide  (Victoza ) 18 MG/3ML injection Inject under the skin.    . lisinopril  20 MG tablet Take 1 tablet by mouth once daily    . Magnesium  Bisglycinate (Mag Glycinate) 100 MG tablet Take by mouth.    . magnesium  oxide (Mag-Ox) 400 MG tablet Take by mouth.    . Melatonin 10 MG tablet Take 10 mg by mouth.    . metFORMIN  (Glucophage ) 1000 MG tablet Take 1 tablet by mouth twice a day.    . oxyCODONE  ER (OxyCONTIN ) 10 MG 12 hr tablet Take by mouth.    . pregabalin  (Lyrica ) 200 MG capsule Take 200 mg by mouth.    . promethazine  (Phenergan ) 25 MG tablet Take 1 tablet by mouth.    . QUEtiapine  XR  (SEROquel  XR) 150 MG 24 hr tablet Take 150 mg by mouth.    . SUMAtriptan  (Imitrex ) 100 MG tablet Take 100 mg by mouth.    . tacrolimus  (Prograf ) 1 MG capsule Take 1 mg by mouth 1 (one) time each day.     No current facility-administered medications on file prior to visit.    Objective  Vitals:   04/11/24 1349 04/11/24 1417  BP: 126/85   BP Location: Left arm   Pulse: 89   Resp: 18   Temp: (!) 37.7 C (99.9 F) 37.3 C (99.2 F)  TempSrc: Oral Oral  SpO2: 98%   Weight: 108 kg   Height: 5' 6   PainSc:   7         OBGYN/Pregnancy Status: Postmenopausal    Visual Acuity Bilateral Distance (20 feet): 20/25 R Distance (20 feet): 20/25 L Distance (20 feet): 20/25 Corrected?: No No results found.  Physical Exam Vitals and nursing note reviewed.  Constitutional:      General: She is not in acute distress.    Appearance: Normal appearance. She is not ill-appearing, toxic-appearing or diaphoretic.  HENT:     Head: Normocephalic and atraumatic.  Eyes:     General: Lids are normal. Vision grossly intact. Gaze aligned appropriately.        Right eye: No foreign body, discharge or hordeolum.        Left eye: No foreign body, discharge or hordeolum.     Conjunctiva/sclera: Conjunctivae normal.     Right eye: Right conjunctiva is not injected. No chemosis, exudate or hemorrhage.    Left eye: Left conjunctiva is not injected. No chemosis, exudate or hemorrhage.    Pupils: Pupils are equal, round, and reactive to light. Pupils are equal.     Right eye: Pupil is round, reactive and not sluggish.     Left eye: Pupil is round, reactive and not sluggish.  Pulmonary:     Effort: Pulmonary effort is normal. No respiratory distress.  Musculoskeletal:     Cervical back: Normal range of motion and neck supple. No rigidity.  Skin:    General: Skin is dry.     Coloration: Skin is not jaundiced or pale.     Findings: No rash.  Neurological:     Mental Status: She is alert.   Psychiatric:        Mood and Affect: Mood normal.        Behavior: Behavior normal.     No results found for this visit on 04/11/24.     Procedures MDM:     1 Acute illness with systemic symptoms     Explanation of Medical  Decision Making and variances from expected care:    Patient presents with symptoms consistent with viral conjunctivitis. However, there are also questions regarding possible cluster headache, migraine, or vomiting potentially related to pain response. Given this, close monitoring is advised. Supportive care for conjunctivitis initiated, and symptomatic treatment recommended. Further evaluation warranted with opthalmology if headache or vomiting worsens or new neurological symptoms develop.    Assessment requiring historian other than patient: No     Independent visualization of image, tracing, or test: No     Discussion of management with another provider: No     Risk:: Moderate            Assessment/Plan Diagnoses and all orders for this visit:  Viral conjunctivitis of right eye  Acute right eye pain -     Visual acuity screening     Disposition Status: Home  Patient Instructions  Viral conjunctivitis is highly contagious; it is spread by direct contact with the patient and their secretions or with contaminated objects and surfaces. Infected individuals should not share handkerchiefs, tissues, towels, cosmetics, linens, or eating utensils. Cases of viral conjunctivitis are self limited and symptoms usually improve within two weeks. Encouraged warm soaks to keep lids and lashes free of debris, cool compresses for soothing care. Eye lubricants such as Systane or Refresh are recommended.   If no improvement of symptoms in the next 48-72 hours patient to follow up with ophthalmologist specialist for further evaluation and management.     Progress note signed by Alm Prophet, PA on 04/11/24 at  4:10 PM

## 2024-04-11 NOTE — Discharge Instructions (Signed)
You have been seen in the Emergency Department (ED) for a headache.   ° °As we have discussed, please follow up with your primary care doctor as soon as possible regarding today?s Emergency Department (ED) visit and your headache symptoms.   ° °Call your doctor or return to the ED if you have a worsening headache, sudden and severe headache, confusion, slurred speech, facial droop, weakness or numbness in any arm or leg, extreme fatigue, vision problems, or other symptoms that concern you. ° °

## 2024-04-12 ENCOUNTER — Ambulatory Visit: Admitting: Physical Therapy

## 2024-04-12 ENCOUNTER — Encounter: Payer: Self-pay | Admitting: Internal Medicine

## 2024-04-12 ENCOUNTER — Inpatient Hospital Stay

## 2024-04-12 ENCOUNTER — Encounter

## 2024-04-12 DIAGNOSIS — I10 Essential (primary) hypertension: Secondary | ICD-10-CM | POA: Diagnosis not present

## 2024-04-12 DIAGNOSIS — G4733 Obstructive sleep apnea (adult) (pediatric): Secondary | ICD-10-CM | POA: Diagnosis not present

## 2024-04-12 DIAGNOSIS — M329 Systemic lupus erythematosus, unspecified: Secondary | ICD-10-CM | POA: Diagnosis not present

## 2024-04-12 DIAGNOSIS — G588 Other specified mononeuropathies: Secondary | ICD-10-CM | POA: Diagnosis not present

## 2024-04-12 DIAGNOSIS — R519 Headache, unspecified: Secondary | ICD-10-CM | POA: Diagnosis not present

## 2024-04-12 DIAGNOSIS — H4710 Unspecified papilledema: Secondary | ICD-10-CM | POA: Diagnosis not present

## 2024-04-12 DIAGNOSIS — M792 Neuralgia and neuritis, unspecified: Secondary | ICD-10-CM | POA: Diagnosis not present

## 2024-04-12 DIAGNOSIS — G894 Chronic pain syndrome: Secondary | ICD-10-CM | POA: Diagnosis not present

## 2024-04-12 DIAGNOSIS — F319 Bipolar disorder, unspecified: Secondary | ICD-10-CM | POA: Diagnosis not present

## 2024-04-12 DIAGNOSIS — H469 Unspecified optic neuritis: Secondary | ICD-10-CM | POA: Diagnosis not present

## 2024-04-12 DIAGNOSIS — Z944 Liver transplant status: Secondary | ICD-10-CM | POA: Diagnosis not present

## 2024-04-12 DIAGNOSIS — E119 Type 2 diabetes mellitus without complications: Secondary | ICD-10-CM | POA: Diagnosis not present

## 2024-04-12 DIAGNOSIS — H5711 Ocular pain, right eye: Secondary | ICD-10-CM

## 2024-04-12 LAB — CSF CELL COUNT WITH DIFFERENTIAL
RBC Count, CSF: 13 /mm3 — ABNORMAL HIGH (ref 0–3)
RBC Count, CSF: 29 /mm3 — ABNORMAL HIGH (ref 0–3)
Tube #: 1
Tube #: 4
WBC, CSF: 3 /mm3 (ref 0–5)
WBC, CSF: 5 /mm3 (ref 0–5)

## 2024-04-12 LAB — MENINGITIS/ENCEPHALITIS PANEL (CSF)

## 2024-04-12 LAB — CBC
HCT: 36.3 % (ref 36.0–46.0)
Hemoglobin: 11.9 g/dL — ABNORMAL LOW (ref 12.0–15.0)
MCH: 28.5 pg (ref 26.0–34.0)
MCHC: 32.8 g/dL (ref 30.0–36.0)
MCV: 86.8 fL (ref 80.0–100.0)
Platelets: 163 K/uL (ref 150–400)
RBC: 4.18 MIL/uL (ref 3.87–5.11)
RDW: 14.8 % (ref 11.5–15.5)
WBC: 8 K/uL (ref 4.0–10.5)
nRBC: 0 % (ref 0.0–0.2)

## 2024-04-12 LAB — COMPREHENSIVE METABOLIC PANEL WITH GFR
ALT: 15 U/L (ref 0–44)
AST: 19 U/L (ref 15–41)
Albumin: 3.5 g/dL (ref 3.5–5.0)
Alkaline Phosphatase: 67 U/L (ref 38–126)
Anion gap: 10 (ref 5–15)
BUN: 14 mg/dL (ref 6–20)
CO2: 24 mmol/L (ref 22–32)
Calcium: 9.3 mg/dL (ref 8.9–10.3)
Chloride: 102 mmol/L (ref 98–111)
Creatinine, Ser: 0.88 mg/dL (ref 0.44–1.00)
GFR, Estimated: >60 mL/min (ref >60)
Glucose, Bld: 242 mg/dL — ABNORMAL HIGH (ref 70–99)
Potassium: 4.5 mmol/L (ref 3.5–5.1)
Sodium: 136 mmol/L (ref 135–145)
Total Bilirubin: 1 mg/dL (ref 0.0–1.2)
Total Protein: 7.1 g/dL (ref 6.5–8.1)

## 2024-04-12 LAB — CBG MONITORING, ED
Glucose-Capillary: 234 mg/dL — ABNORMAL HIGH (ref 70–99)
Glucose-Capillary: 339 mg/dL — ABNORMAL HIGH (ref 70–99)
Glucose-Capillary: 363 mg/dL — ABNORMAL HIGH (ref 70–99)

## 2024-04-12 LAB — C-REACTIVE PROTEIN: CRP: 0.6 mg/dL (ref <1.0)

## 2024-04-12 LAB — PROTEIN AND GLUCOSE, CSF
Glucose, CSF: 145 mg/dL — ABNORMAL HIGH (ref 40–70)
Total  Protein, CSF: 60 mg/dL — ABNORMAL HIGH (ref 15–45)

## 2024-04-12 LAB — GLUCOSE, CAPILLARY
Glucose-Capillary: 292 mg/dL — ABNORMAL HIGH (ref 70–99)
Glucose-Capillary: 401 mg/dL — ABNORMAL HIGH (ref 70–99)
Glucose-Capillary: 391 mg/dL — ABNORMAL HIGH (ref 70–99)

## 2024-04-12 MED ORDER — SODIUM CHLORIDE 0.9 % IV SOLN
1000.0000 mg | INTRAVENOUS | Status: AC
  Administered 2024-04-12 – 2024-04-13 (×2): 1000 mg via INTRAVENOUS
  Filled 2024-04-12 (×2): qty 16

## 2024-04-12 MED ORDER — INSULIN ASPART 100 UNIT/ML IJ SOLN
6.0000 [IU] | Freq: Three times a day (TID) | INTRAMUSCULAR | Status: DC
  Administered 2024-04-12 – 2024-04-13 (×3): 6 [IU] via SUBCUTANEOUS
  Filled 2024-04-12 (×3): qty 1

## 2024-04-12 MED ORDER — NALOXONE HCL 4 MG/0.1ML NA LIQD: 0.4000 mg | Freq: Once | NASAL | Status: DC

## 2024-04-12 MED ORDER — MELATONIN 5 MG PO TABS
10.0000 mg | ORAL_TABLET | Freq: Every day | ORAL | Status: DC
Start: 2024-04-12 — End: 2024-04-16
  Administered 2024-04-12 – 2024-04-15 (×4): 10 mg via ORAL
  Filled 2024-04-12 (×5): qty 2

## 2024-04-12 MED ORDER — LURASIDONE HCL 20 MG PO TABS
20.0000 mg | ORAL_TABLET | Freq: Every day | ORAL | Status: DC
  Administered 2024-04-12 – 2024-04-16 (×6): 20 mg via ORAL
  Filled 2024-04-12 (×6): qty 1

## 2024-04-12 MED ORDER — METOCLOPRAMIDE HCL 5 MG/ML IJ SOLN
5.0000 mg | Freq: Three times a day (TID) | INTRAMUSCULAR | Status: DC | PRN
  Administered 2024-04-12 (×2): 5 mg via INTRAVENOUS
  Filled 2024-04-12 (×2): qty 2

## 2024-04-12 MED ORDER — MAGNESIUM OXIDE -MG SUPPLEMENT 400 (240 MG) MG PO TABS
800.0000 mg | ORAL_TABLET | Freq: Two times a day (BID) | ORAL | Status: DC
  Administered 2024-04-12 – 2024-04-16 (×10): 800 mg via ORAL
  Filled 2024-04-12 (×10): qty 2

## 2024-04-12 MED ORDER — VENLAFAXINE HCL ER 75 MG PO CP24
75.0000 mg | ORAL_CAPSULE | Freq: Every day | ORAL | Status: DC
  Administered 2024-04-12 – 2024-04-16 (×5): 75 mg via ORAL
  Filled 2024-04-12 (×6): qty 1

## 2024-04-12 MED ORDER — TACROLIMUS 1 MG PO CAPS
1.0000 mg | ORAL_CAPSULE | Freq: Every morning | ORAL | Status: DC
  Administered 2024-04-12 – 2024-04-16 (×5): 1 mg via ORAL
  Filled 2024-04-12 (×6): qty 1

## 2024-04-12 MED ORDER — MIRTAZAPINE 15 MG PO TABS: 15.0000 mg | ORAL_TABLET | Freq: Every day | ORAL | Status: DC

## 2024-04-12 MED ORDER — HYDROMORPHONE HCL 1 MG/ML IJ SOLN
0.5000 mg | INTRAMUSCULAR | Status: DC | PRN
  Administered 2024-04-12 (×4): 0.5 mg via INTRAVENOUS
  Filled 2024-04-12 (×4): qty 0.5

## 2024-04-12 MED ORDER — OXYCODONE HCL 5 MG PO TABS
5.0000 mg | ORAL_TABLET | Freq: Four times a day (QID) | ORAL | Status: DC | PRN
  Administered 2024-04-12: 5 mg via ORAL
  Filled 2024-04-12: qty 1

## 2024-04-12 MED ORDER — LIDOCAINE 1 % OPTIME INJ - NO CHARGE
5.0000 mL | Freq: Once | INTRAMUSCULAR | Status: AC
Start: 2024-04-12 — End: 2024-04-12
  Administered 2024-04-12: 5 mL
  Filled 2024-04-12: qty 6

## 2024-04-12 MED ORDER — OXYCODONE HCL 5 MG PO TABS
5.0000 mg | ORAL_TABLET | Freq: Four times a day (QID) | ORAL | Status: DC | PRN
  Administered 2024-04-15 – 2024-04-16 (×2): 5 mg via ORAL
  Filled 2024-04-12 (×2): qty 1

## 2024-04-12 MED ORDER — ROSUVASTATIN CALCIUM 10 MG PO TABS
10.0000 mg | ORAL_TABLET | Freq: Every day | ORAL | Status: DC
  Administered 2024-04-12 – 2024-04-16 (×5): 10 mg via ORAL
  Filled 2024-04-12 (×6): qty 1

## 2024-04-12 MED ORDER — HEPARIN SODIUM (PORCINE) 5000 UNIT/ML IJ SOLN
5000.0000 [IU] | Freq: Three times a day (TID) | INTRAMUSCULAR | Status: AC
  Administered 2024-04-12 – 2024-04-13 (×5): 5000 [IU] via SUBCUTANEOUS
  Filled 2024-04-12 (×6): qty 1

## 2024-04-12 MED ORDER — INSULIN GLARGINE-YFGN 100 UNIT/ML ~~LOC~~ SOLN
40.0000 [IU] | Freq: Every day | SUBCUTANEOUS | Status: DC
  Administered 2024-04-12 (×2): 40 [IU] via SUBCUTANEOUS
  Filled 2024-04-12 (×3): qty 0.4

## 2024-04-12 MED ORDER — GADOBUTROL 1 MMOL/ML IV SOLN
10.0000 mL | Freq: Once | INTRAVENOUS | Status: AC | PRN
  Administered 2024-04-12: 10 mL via INTRAVENOUS

## 2024-04-12 MED ORDER — PREGABALIN 75 MG PO CAPS
200.0000 mg | ORAL_CAPSULE | Freq: Two times a day (BID) | ORAL | Status: DC
  Administered 2024-04-12 – 2024-04-16 (×10): 200 mg via ORAL
  Filled 2024-04-12 (×11): qty 1

## 2024-04-12 MED ORDER — VENLAFAXINE HCL ER 37.5 MG PO CP24
37.5000 mg | ORAL_CAPSULE | Freq: Every morning | ORAL | Status: DC
  Administered 2024-04-12 – 2024-04-16 (×4): 37.5 mg via ORAL
  Filled 2024-04-12 (×6): qty 1

## 2024-04-12 NOTE — Hospital Course (Addendum)
 Taken from H&P.  Alison [name]  is a 49 y.o. female, with medical history significant for morbid obesity, lymphoma bilateral parotid gland s/p Rituxan  in 2020, diffuse large B-cell lymphoma,  lupus s/p liver transplant, currently on tacrolimus , neuropathy, major depressive disorder, sleep apnea presents to ED secondary to complaints of right eye pain, and headache for about 10 days.  Denies any change in vision, fever or chills.  ED provider discussed with ophthalmology, they requested MRI of brain and orbit due to concern of autoimmune process, imaging without contrast with concern of temporal arteritis versus optic perineuritis.  Neurology and vascular surgery was consulted and patient was started on Solu-Medrol .  7/7: Vital stable, labs with elevated blood glucose level, likely secondary to steroid, CRP was normal at 0.6, and mildly elevated ESR which is better than his baseline with history of chronic illnesses is not compatible with temporal arteritis.  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  No evidence of enhancement within the optic nerve.  Pending ophthalmology evaluation. Neurology also ordered lumbar puncture. Continuing steroids for now Temporal artery biopsy is planned for Wednesday with vascular surgery.  7/8: Symptoms improving, CSF culture with no organism, ophthalmology signed off as there was no obvious abnormality. Likely autoimmune with her history of lupus. Neurology is suggesting 3 doses of high-dose steroid followed by prednisone  50 mg daily-need discharge orders for at least 2 weeks and outpatient neurology will follow-up and taper accordingly. CBG remained elevated secondary to high-dose steroid requiring extra insulin .

## 2024-04-12 NOTE — Assessment & Plan Note (Signed)
 Outpatient follow up.

## 2024-04-12 NOTE — Assessment & Plan Note (Signed)
 Blood pressure currently within goal. Currently home lisinopril  has been held by our patient provider for about a month. - Continue to monitor -Will restart lisinopril  if needed

## 2024-04-12 NOTE — ED Notes (Signed)
 Elgergawy MD made aware pt requesting Remeron  be ordered.

## 2024-04-12 NOTE — ED Notes (Signed)
 Elgergawy MD made aware pt states oxycodone  5mg  is not going to help her but that the dilaudid  did improve pain.

## 2024-04-12 NOTE — Assessment & Plan Note (Signed)
 Estimated body mass index is 39.54 kg/m as calculated from the following:   Height as of this encounter: 5' 6 (1.676 m).   Weight as of this encounter: 111.1 kg.   - This will complicate overall prognosis -Encouraged weight loss

## 2024-04-12 NOTE — Consult Note (Signed)
 [facility] Vascular Consult Note  MRN : [id number]  Alison [name] is a 48 y.o. (Apr 22, 1976) female who presents with chief complaint of  Chief Complaint  Patient presents with   Eye Pain   Headache  .   Consulting Physician:Dawood Elgergawy, MD Reason for consult: Temporal arteritis concern History of Present Illness:   Alison [name] is a 48 y.o. female with previous medical history of parotid lymphoma, diffuse large B-cell lymphoma, fibromyalgia, major depressive disorder, neuropathy, SLE status post liver transplant currently on tacrolimus  who presented to [facility] for evaluation of a headache for approximately 10 days.  It is mainly behind her right eye which started as a dull ache but has progressed and currently feels as if somebody has punched her in her heart and is constantly aching.  She is sensitive to lights.  She also has pain with eye movement.  She does not report any vision loss or any symptoms of amaurosis fugax.  She denies any jaw claudication  She has had not ESR completed which was 44 and her CRP was normal.  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    Current Facility-Administered Medications  Medication Dose Route Frequency Provider Last Rate Last Admin   heparin  injection 5,000 Units  5,000 Units Subcutaneous Q8H Elgergawy, Brayton RAMAN, MD       HYDROmorphone  (DILAUDID ) injection 0.5 mg  0.5 mg Intravenous Q4H PRN Elgergawy, Dawood S, MD   0.5 mg at 04/12/24 9061   insulin  aspart (novoLOG ) injection 0-20 Units  0-20 Units Subcutaneous TID WC Elgergawy, Dawood S, MD   15 Units at 04/12/24 9162   insulin  aspart (novoLOG ) injection 0-5 Units  0-5 Units Subcutaneous QHS Elgergawy, Dawood S, MD   2 Units at 04/12/24 0058   insulin  aspart (novoLOG ) injection 6 Units  6 Units Subcutaneous TID WC Amin,  Sumayya, MD       insulin  glargine-yfgn (SEMGLEE ) injection 40 Units  40 Units Subcutaneous QHS Elgergawy, Dawood S, MD   40 Units at 04/12/24 0059   lurasidone  (LATUDA ) tablet 20 mg  20 mg Oral Daily Elgergawy, Dawood S, MD   20 mg at 04/12/24 9160   magnesium  oxide (MAG-OX) tablet 800 mg  800 mg Oral BID Elgergawy, Dawood S, MD   800 mg at 04/12/24 9161   melatonin tablet 10 mg  10 mg Oral QHS Elgergawy, Dawood S, MD       methylPREDNISolone  sodium succinate (SOLU-MEDROL ) 1,000 mg in sodium chloride  0.9 % 50 mL IVPB  1,000 mg Intravenous Q24H Voncile Isles, MD       metoCLOPramide  (REGLAN ) injection 5 mg  5 mg Intravenous Q8H PRN Elgergawy, Dawood S, MD   5 mg at 04/12/24 0148   naloxone  (NARCAN ) nasal spray 4 mg/0.1 mL  0.4 mg Nasal Once Elgergawy, Dawood S, MD       oxyCODONE  (Oxy IR/ROXICODONE ) immediate release tablet 5 mg  5 mg Oral Q6H PRN Elgergawy, Dawood S, MD       pantoprazole  (PROTONIX ) EC tablet 40 mg  40 mg Oral Daily Elgergawy, Dawood S, MD   40 mg at 04/12/24 0841   pregabalin  (LYRICA ) capsule 200 mg  200 mg Oral BID Elgergawy, Dawood S, MD   200 mg at 04/12/24 9161   rosuvastatin  (CRESTOR ) tablet 10 mg  10 mg Oral Daily Elgergawy, Dawood S, MD  10 mg at 04/12/24 9160   tacrolimus  (PROGRAF ) capsule 1 mg  1 mg Oral q morning Elgergawy, Dawood S, MD   1 mg at 04/12/24 9060   venlafaxine  XR (EFFEXOR -XR) 24 hr capsule 37.5 mg  37.5 mg Oral q morning Elgergawy, Dawood S, MD   37.5 mg at 04/12/24 0940   venlafaxine  XR (EFFEXOR -XR) 24 hr capsule 75 mg  75 mg Oral Q breakfast Elgergawy, Dawood S, MD   75 mg at 04/12/24 1002   Current Outpatient Medications  Medication Sig Dispense Refill   albuterol  (VENTOLIN  HFA) 108 (90 Base) MCG/ACT inhaler Inhale 2 puffs into the lungs every 6 (six) hours as needed for wheezing or shortness of breath. 8 g 0   baclofen  (LIORESAL ) 10 MG tablet Take 1 tablet by mouth 2 (two) times daily.     benztropine  (COGENTIN ) 1 MG tablet TAKE 1 TABLET BY MOUTH  ONCE DAILY AS NEEDED FOR TREMORS 30 tablet 10   fluticasone  (FLONASE ) 50 MCG/ACT nasal spray Place into the nose.     glipiZIDE  (GLUCOTROL  XL) 10 MG 24 hr tablet TAKE 1 TABLET BY MOUTH IN THE MORNING AND AT BEDTIME 60 tablet 5   insulin  glargine (LANTUS  SOLOSTAR) 100 UNIT/ML Solostar Pen Inject 40 Units into the skin daily. 15 mL 3   lurasidone  (LATUDA ) 20 MG TABS tablet TAKE 1 TABLET BY MOUTH ONCE DAILY WITH SUPPER *REFILL REQUEST* 90 tablet 10   magnesium  oxide (MAG-OX) 400 MG tablet Take 1,000 mg by mouth 2 (two) times daily.     metFORMIN  (GLUCOPHAGE ) 1000 MG tablet TAKE 1 TABLET BY MOUTH TWICE DAILY WITH MEALS *PATIENT WILL NEED TO SCHEDULE APPOINTMENT FOR ADDITIONAL REFILLS* 180 tablet 0   mirabegron  ER (MYRBETRIQ ) 50 MG TB24 tablet Take 1 tablet (50 mg total) by mouth daily. 90 tablet 3   mirtazapine  (REMERON ) 15 MG tablet TAKE 1 TABLET BY MOUTH EVERY DAY AT BEDTIME *REFILL REQUEST* 90 tablet 10   naloxone  (NARCAN ) nasal spray 4 mg/0.1 mL Place 0.4 mg into the nose once.     NOVOLOG  FLEXPEN 100 UNIT/ML FlexPen Inject 0-30 Units into the skin 3 (three) times daily with meals. Sliding scale     ondansetron  (ZOFRAN ) 4 MG tablet Take 1 tablet (4 mg total) by mouth every 6 (six) hours as needed for nausea. 20 tablet 0   oxyCODONE  (OXY IR/ROXICODONE ) 5 MG immediate release tablet Take 5 mg by mouth 3 (three) times daily as needed.     pregabalin  (LYRICA ) 200 MG capsule Take 1 capsule (200 mg total) by mouth 2 (two) times daily. 60 capsule 0   rosuvastatin  (CRESTOR ) 10 MG tablet Take 1 tablet (10 mg total) by mouth daily. 90 tablet 2   Semaglutide , 1 MG/DOSE, 4 MG/3ML SOPN Inject 1 mg into the skin once a week. 3 mL 0   tacrolimus  (PROGRAF ) 1 MG capsule Take 1 capsule by mouth every morning.     TYLENOL  PM EXTRA STRENGTH 1000-50 MG/30ML LIQD Take by mouth.     UBRELVY  50 MG TABS Take 1 tablet by mouth daily as needed.     venlafaxine  XR (EFFEXOR -XR) 37.5 MG 24 hr capsule Take 37.5 mg by mouth  every morning.     venlafaxine  XR (EFFEXOR -XR) 75 MG 24 hr capsule Take 1 capsule (75 mg total) by mouth daily with breakfast. Take along with 37.5 mg daily 30 capsule 10   Accu-Chek Softclix Lancets lancets 2 (two) times daily.     Continuous Glucose Sensor (FREESTYLE LIBRE 3  PLUS SENSOR) MISC CHANGE SENSOR EVERY 15 DAYS 6 each 3   Cranberry-Vitamin C-Probiotic (AZO CRANBERRY PO) Take by mouth.     Insulin  Pen Needle 32G X 6 MM MISC 1 each by Does not apply route as needed. 100 each 5   [Paused] lisinopril  (ZESTRIL ) 20 MG tablet TAKE 1 TABLET BY MOUTH ONCE DAILY *PATIENT WILL NEED TO SCHEDULE AN APPOINTMENT FOR ADDITIONAL REFILLS* 90 tablet 0   Melatonin 10 MG TABS Take 10 mg by mouth at bedtime. (Patient not taking: Reported on 04/11/2024)     PRECISION QID TEST test strip       Past Medical History:  Diagnosis Date   Abnormal uterine bleeding    Allergy    Anxiety    Arthritis    Bipolar disorder (manic depression) (HCC)    Cancer (HCC) Diffuse B-Cell Lymphoma   2015   Chest pain    a. 09/2021 MV: Low risk without ischemia or infarct.  Normal EF.   Chronic kidney failure    Chronic renal disease, stage III (HCC)    Diabetes mellitus without complication (HCC)    DLBCL (diffuse large B cell lymphoma) (HCC) 2015   Right axillary lymph node resected and chemo tx's.   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 02/2023 Echo: EF 55-60%, no rwma, nl RV size/fxn. Mildly dil LA.   Personality disorder (HCC)    Pineal gland cyst    Post herpetic neuralgia    PTSD (post-traumatic stress  disorder)    RBBB    Renal disorder    S/P liver transplant (HCC) 1993   Sleep apnea    has not recieved cpap yet-other one was recalled    Past Surgical History:  Procedure Laterality Date   BONE MARROW BIOPSY  01/13/2015   BREAST BIOPSY Right 12/2014   US  Axilla Bx, Positive for Lymphoma   BREAST BIOPSY Right 2011   benign   BREAST BIOPSY Right 2023   Stereo bx, X-clip, Fat Necrosis   BREAST BIOPSY Right 01/20/2024   US  RT BREAST BX W LOC DEV 1ST LESION IMG BX SPEC US  GUIDE 01/20/2024 [facility]   BREAST SURGERY Right 2016   Lymphoma, lymph node removal. pt then got a seroma and had to do more surgery   CHOLECYSTECTOMY     COLONOSCOPY     COLONOSCOPY WITH PROPOFOL  N/A 02/28/2022   Procedure: COLONOSCOPY WITH PROPOFOL ;  Surgeon: Jinny Carmine, MD;  Location: [facility];  Service: Endoscopy;  Laterality: N/A;   ESOPHAGOGASTRODUODENOSCOPY     ESOPHAGOGASTRODUODENOSCOPY (EGD) WITH PROPOFOL  N/A 01/09/2021   Procedure: ESOPHAGOGASTRODUODENOSCOPY (EGD) WITH PROPOFOL ;  Surgeon: Jinny Carmine, MD;  Location: [facility];  Service: Endoscopy;  Laterality: N/A;   HERNIA REPAIR     x4-all from liver transplant   HYSTEROSCOPY WITH D & C N/A 11/13/2021   Procedure: DILATATION AND CURETTAGE /HYSTEROSCOPY;  Surgeon: Victor Claudell SAUNDERS, MD;  Location: [facility];  Service: Gynecology;  Laterality: N/A;   infusaport     LIVER TRANSPLANT  12/17/1991   LUMBAR PUNCTURE     PORT A CATH INJECTION ([facility])     RENAL BIOPSY     tumor removal  2015   cancer right side    Social History Social History   Tobacco Use   Smoking status: Never    Passive exposure: Past   Smokeless tobacco: Never   Tobacco comments:    Occasional CBD oil  Vaping Use   Vaping status: Never Used  Substance Use Topics   Drug use: Not Currently    Comment: pain managment last used in early april    Family History Family History  Problem Relation Age of Onset   Heart disease Mother    Hypertension  Mother    Cancer - Other Mother    Bipolar disorder Mother    Anxiety disorder Mother    Arthritis Mother    Depression Mother    Heart disease Father    Hypertension Father    Diabetes Father    Parkinson's disease Maternal Grandmother    COPD Maternal Grandmother    Cancer Maternal Aunt    Cancer Maternal Uncle    Cancer Maternal Grandfather    Lupus Paternal Grandmother    Hypertension Brother    Alcohol abuse Brother    Depression Brother    Alcohol abuse Maternal Uncle     Allergies  Allergen Reactions   Penicillin G Itching, Rash, Anaphylaxis and Palpitations   Lamictal  [Lamotrigine ] Rash   Tape    Carbamazepine Other (See Comments)    Medication interaction-prograf     Hydrocodone-Acetaminophen  Itching   Naproxen Itching   Penicillins    Vancomycin  Rash    Macular/blotchy rash at infusion site     REVIEW OF SYSTEMS (Negative unless checked)  Constitutional: [] Weight loss  [] Fever  [] Chills Cardiac: [] Chest pain   [] Chest pressure   [] Palpitations   [] Shortness of breath when laying flat   [] Shortness of breath at rest   [] Shortness of breath with exertion. Vascular:  [] Pain in legs with walking   [] Pain in legs at rest   [] Pain in legs when laying flat   [] Claudication   [] Pain in feet when walking  [] Pain in feet at rest  [] Pain in feet when laying flat   [] History of DVT   [] Phlebitis   [] Swelling in legs   [] Varicose veins   [] Non-healing ulcers Pulmonary:   [] Uses home oxygen   [] Productive cough   [] Hemoptysis   [] Wheeze  [] COPD   [] Asthma Neurologic:  x Headache [] Dizziness  [] Blackouts   [] Seizures   [] History of stroke   [] History of TIA  [] Aphasia   [] Temporary blindness   [] Dysphagia   [] Weakness or numbness in arms   [] Weakness or numbness in legs Musculoskeletal:  [] Arthritis   [] Joint swelling   [] Joint pain   [] Low back pain Hematologic:  [] Easy bruising  [] Easy bleeding   [] Hypercoagulable state   [] Anemic  [] Hepatitis Gastrointestinal:  [] Blood in  stool   [] Vomiting blood  [] Gastroesophageal reflux/heartburn   [] Difficulty swallowing. Genitourinary:  [] Chronic kidney disease   [] Difficult urination  [] Frequent urination  [] Burning with urination   [] Blood in urine Skin:  [] Rashes   [] Ulcers   [] Wounds Psychological:  [] History of anxiety   []  History of major depression.  Physical Examination  Vitals:   04/12/24 0615 04/12/24 0630 04/12/24 0700 04/12/24 1139  BP:   121/78 (!) 133/94  Pulse: 64 62 66 72  Resp: 16 13 20 17   Temp:    98.6 F (37 C)  TempSrc:    Oral  SpO2: 92% 93% 93% 94%  Weight:      Height:       Body mass index is 39.54 kg/m. Gen:  WD/WN, NAD Head: [place]/AT, No temporalis wasting. Prominent temp pulse not noted. Ear/Nose/Throat: Hearing grossly intact, nares w/o erythema or drainage, oropharynx w/o Erythema/Exudate Eyes: Unable to visualize as patient is covering Neck: Trachea midline.  No JVD.  Pulmonary:  Good air movement, respirations not labored, equal bilaterally.  Cardiac: RRR, normal S1, S2. Vascular:  Vessel Right Left  Radial Palpable Palpable   Gastrointestinal: soft, non-tender/non-distended. No guarding/reflex.  Musculoskeletal: M/S 5/5 throughout.  Extremities without ischemic changes.  No deformity or atrophy. No edema. Neurologic: Sensation grossly intact in extremities.  Symmetrical.  Speech is fluent. Motor exam as listed above. Psychiatric: Judgment intact, Mood & affect appropriate for pt's clinical situation. Dermatologic: No rashes or ulcers noted.  No cellulitis or open wounds. Lymph : No Cervical, Axillary, or Inguinal lymphadenopathy.    CBC Lab Results  Component Value Date   WBC 8.0 04/12/2024   HGB 11.9 (L) 04/12/2024   HCT 36.3 04/12/2024   MCV 86.8 04/12/2024   PLT 163 04/12/2024    BMET    Component Value Date/Time   NA 136 04/12/2024 0500   K 4.5 04/12/2024 0500   CL 102 04/12/2024 0500   CO2 24 04/12/2024 0500   GLUCOSE 242 (H) 04/12/2024 0500   BUN 14  04/12/2024 0500   CREATININE 0.88 04/12/2024 0500   CREATININE 1.14 (H) 04/01/2024 1043   CALCIUM  9.3 04/12/2024 0500   CALCIUM  7.6 (L) 06/27/2022 1448   GFRNONAA >60 04/12/2024 0500   GFRNONAA >60 03/03/2024 0958   GFRNONAA 64 12/11/2020 0000   GFRAA 74 12/11/2020 0000   Estimated Creatinine Clearance: 96.6 mL/min (by C-G formula based on SCr of 0.88 mg/dL).  COAG Lab Results  Component Value Date   INR 1.0 03/15/2024   INR 1.1 10/15/2023   INR 1.2 03/07/2023    Radiology MR ORBITS W WO CONTRAST Result Date: 04/12/2024 CLINICAL DATA:  Concern for optic neuritis, edema in the intraconal fat of the right orbit. Right eye pain and headache for several days. EXAM: MRI OF THE ORBITS WITHOUT AND WITH CONTRAST TECHNIQUE: Multiplanar, multi-echo pulse sequences of the orbits and surrounding structures were acquired including fat saturation techniques, before and after intravenous contrast administration. CONTRAST:  10mL GADAVIST  GADOBUTROL  1 MMOL/ML IV SOLN COMPARISON:  MRI head and orbits 04/11/2024. FINDINGS: Orbits: Globes are intact. Lenses are normally located. No evidence of intra-ocular hemorrhage or mass. Extraocular muscles are unremarkable. Redemonstrated mild edema surrounding the intraorbital right optic nerve particularly along the inferior medial aspect of the nerve. There is mild associated enhancement along the optic nerve sheath and faint enhancement within the adjacent intraconal fat. There is no evidence of enhancement within the substance of the optic nerve. No abnormal enhancement or edema within the left orbit. Normal appearance of the lacrimal glands. Vascular structures are unremarkable. Visualized sinuses: Small focus of mucosal thickening within the left sphenoid sinus. No air-fluid levels. Small bilateral mastoid effusions, right greater than left. Soft tissues: Normal. Limited intracranial: No acute or significant finding. IMPRESSION: Redemonstrated edema along the  intraorbital right optic nerve with mild enhancement along the optic nerve sheath and within the adjacent intraconal fat suggestive of optic perineuritis. No evidence of enhancement  within the substance of the optic nerve. Electronically Signed   By: Donnice Mania M.D.   On: 04/12/2024 10:10   MR BRAIN WO CONTRAST Result Date: 04/11/2024 CLINICAL DATA:  Abdominal plegia and headache.  Right eye pain. EXAM: MRI HEAD AND ORBITS WITHOUT CONTRAST TECHNIQUE: Multiplanar, multi-echo pulse sequences of the brain and surrounding structures were acquired without intravenous contrast. Multiplanar, multi-echo pulse sequences of the orbits and surrounding structures were acquired including fat saturation techniques, without intravenous contrast administration. COMPARISON:  05/29/2022 FINDINGS: MRI HEAD FINDINGS Brain: No acute infarct, mass effect or extra-axial collection. No chronic microhemorrhage or siderosis. Normal white matter signal, parenchymal volume and CSF spaces. The midline structures are normal. Vascular: Normal flow voids. Skull and upper cervical spine: Normal marrow signal. Other: None. MRI ORBITS FINDINGS Orbits: There is mild edema within the intraconal fat of the right orbit. Otherwise, the orbits are normal. Visualized sinuses: Small amount of bilateral mastoid fluid. Paranasal sinuses are clear. Soft tissues: Normal. IMPRESSION: 1. Mild edema within the intraconal fat of the right orbit, compatible with optic perineuritis. This finding may also be associated with temporal arteritis. 2. Normal MRI of the brain. Electronically Signed   By: Franky Stanford M.D.   On: 04/11/2024 22:45   MR ORBITS WO CONTRAST Result Date: 04/11/2024 CLINICAL DATA:  Abdominal plegia and headache.  Right eye pain. EXAM: MRI HEAD AND ORBITS WITHOUT CONTRAST TECHNIQUE: Multiplanar, multi-echo pulse sequences of the brain and surrounding structures were acquired without intravenous contrast. Multiplanar, multi-echo pulse  sequences of the orbits and surrounding structures were acquired including fat saturation techniques, without intravenous contrast administration. COMPARISON:  05/29/2022 FINDINGS: MRI HEAD FINDINGS Brain: No acute infarct, mass effect or extra-axial collection. No chronic microhemorrhage or siderosis. Normal white matter signal, parenchymal volume and CSF spaces. The midline structures are normal. Vascular: Normal flow voids. Skull and upper cervical spine: Normal marrow signal. Other: None. MRI ORBITS FINDINGS Orbits: There is mild edema within the intraconal fat of the right orbit. Otherwise, the orbits are normal. Visualized sinuses: Small amount of bilateral mastoid fluid. Paranasal sinuses are clear. Soft tissues: Normal. IMPRESSION: 1. Mild edema within the intraconal fat of the right orbit, compatible with optic perineuritis. This finding may also be associated with temporal arteritis. 2. Normal MRI of the brain. Electronically Signed   By: Franky Stanford M.D.   On: 04/11/2024 22:45   CT Head Wo Contrast Result Date: 04/11/2024 CLINICAL DATA:  headache EXAM: CT HEAD WITHOUT CONTRAST TECHNIQUE: Contiguous axial images were obtained from the base of the skull through the vertex without intravenous contrast. RADIATION DOSE REDUCTION: This exam was performed according to the departmental dose-optimization program which includes automated exposure control, adjustment of the mA and/or kV according to patient size and/or use of iterative reconstruction technique. COMPARISON:  MRI head 05/29/2022. FINDINGS: Brain: No evidence of acute infarction, hemorrhage, hydrocephalus, extra-axial collection or mass lesion/mass effect. Chronic partially empty sella. Vascular: No hyperdense vessel or unexpected calcification. Skull: No acute fracture. Sinuses/Orbits: Clear sinuses.  No acute orbital finding. IMPRESSION: No evidence of acute intracranial abnormality. Electronically Signed   By: Gilmore GORMAN Molt M.D.   On:  04/11/2024 16:51   CT Angio Chest PE W and/or Wo Contrast Result Date: 03/29/2024 CLINICAL DATA:  Chest pain, fever, and leukocytosis.  Cough. EXAM: CT ANGIOGRAPHY CHEST WITH CONTRAST TECHNIQUE: Multidetector CT imaging of the chest was performed using the standard protocol during bolus administration of intravenous contrast. Multiplanar CT image reconstructions and MIPs were obtained to  evaluate the vascular anatomy. RADIATION DOSE REDUCTION: This exam was performed according to the departmental dose-optimization program which includes automated exposure control, adjustment of the mA and/or kV according to patient size and/or use of iterative reconstruction technique. CONTRAST:  75mL OMNIPAQUE  IOHEXOL  350 MG/ML SOLN COMPARISON:  Chest CT dated 03/14/2024. FINDINGS: Cardiovascular: Top-normal cardiac size. No pericardial effusion. The thoracic aorta is unremarkable. The origins of the great vessels of the aortic arch appear patent. Evaluation of the pulmonary arteries is limited due to respiratory motion. No pulmonary artery embolus identified. Mediastinum/Nodes: Mildly enlarged right hilar lymph node measures 15 mm short axis and similar to prior CT. The esophagus is grossly unremarkable. No mediastinal fluid collection. Lungs/Pleura: Small right upper lobe cyst. No focal consolidation, pleural effusion, or pneumothorax. Bibasilar linear atelectasis/scarring. The central airways are patent. Upper Abdomen: No acute abnormality. Musculoskeletal: No acute osseous pathology. Review of the MIP images confirms the above findings. IMPRESSION: No acute intrathoracic pathology. No CT evidence of pulmonary artery embolus. Electronically Signed   By: Vanetta Chou M.D.   On: 03/29/2024 11:18   DG Chest Port 1 View Result Date: 03/29/2024 CLINICAL DATA:  Chest pain EXAM: PORTABLE CHEST 1 VIEW COMPARISON:  03/14/2024 FINDINGS: There is a left chest wall port a catheter with tip in the superior cavoatrial junction.  Stable cardiomediastinal contours. Lung volumes are low. Slight asymmetric elevation of the right hemidiaphragm, unchanged. No pleural fluid, interstitial edema or airspace disease. IMPRESSION: Low lung volumes. No acute findings. Electronically Signed   By: Waddell Calk M.D.   On: 03/29/2024 05:30   CT ABDOMEN PELVIS W CONTRAST Result Date: 03/14/2024 CLINICAL DATA:  Abdominal pain, acute, nonlocalized right flank and abd pain EXAM: CT ABDOMEN AND PELVIS WITH CONTRAST TECHNIQUE: Multidetector CT imaging of the abdomen and pelvis was performed using the standard protocol following bolus administration of intravenous contrast. RADIATION DOSE REDUCTION: This exam was performed according to the departmental dose-optimization program which includes automated exposure control, adjustment of the mA and/or kV according to patient size and/or use of iterative reconstruction technique. CONTRAST:  OMNIPAQUE  IOHEXOL  350 MG/ML SOLN COMPARISON:  03/06/2023 FINDINGS: Lower chest: See chest CT report Hepatobiliary: Subtle nodular contours of the liver surface. Recommend clinical correlation for possible early cirrhosis. No focal hepatic abnormality. Prior cholecystectomy. Pancreas: No focal abnormality or ductal dilatation. Spleen: Splenomegaly with a craniocaudal length of 14 cm. No focal abnormality. Adrenals/Urinary Tract: Atrophy of the left kidney. Areas of scarring in the kidneys bilaterally. Small bilateral renal cysts are stable. No follow-up imaging recommended. No stones or hydronephrosis. Adrenal glands and urinary bladder unremarkable. Stomach/Bowel: Stomach, large and small bowel grossly unremarkable. Vascular/Lymphatic: No evidence of aneurysm or adenopathy. Scattered aortic atherosclerosis. Prominent vessels around the distal esophagus compatible with distal esophageal varices. Reproductive: Uterus and adnexa unremarkable.  No mass. Other: No free fluid or free air. Musculoskeletal: No acute bony  abnormality. IMPRESSION: Subtle nodularity to the liver surface suggests cirrhosis. There are distal esophageal varices and mild splenomegaly. No acute findings in the abdomen or pelvis. Electronically Signed   By: Franky Crease M.D.   On: 03/14/2024 22:41   CT Angio Chest PE W and/or Wo Contrast Result Date: 03/14/2024 CLINICAL DATA:  Pulmonary embolism (PE) suspected, high prob. Shortness of breath, fever, cough EXAM: CT ANGIOGRAPHY CHEST WITH CONTRAST TECHNIQUE: Multidetector CT imaging of the chest was performed using the standard protocol during bolus administration of intravenous contrast. Multiplanar CT image reconstructions and MIPs were obtained to evaluate the vascular anatomy.  RADIATION DOSE REDUCTION: This exam was performed according to the departmental dose-optimization program which includes automated exposure control, adjustment of the mA and/or kV according to patient size and/or use of iterative reconstruction technique. CONTRAST:  OMNIPAQUE  IOHEXOL  350 MG/ML SOLN COMPARISON:  02/23/2024 FINDINGS: Cardiovascular: No filling defects in the pulmonary arteries to suggest pulmonary emboli. Heart borderline in size. Scattered coronary artery and aortic calcifications. Mediastinum/Nodes: No mediastinal, hilar, or axillary adenopathy. Trachea and esophagus are unremarkable. Thyroid  unremarkable. Lungs/Pleura: Ground-glass airspace opacities in the left upper lobe and left lower lobe as well as right lower lobe. Findings could reflect early asymmetric edema or infection. No effusions. Upper Abdomen: See abdominal CT report today. Musculoskeletal: Chest wall soft tissues are unremarkable. No acute bony abnormality. Review of the MIP images confirms the above findings. IMPRESSION: No evidence of pulmonary embolus. Borderline cardiomegaly.  Scattered coronary artery calcifications. Scattered ground-glass opacities in the lungs bilaterally, left slightly greater than right. This could reflect early  asymmetric edema or infection. Aortic Atherosclerosis (ICD10-I70.0). Electronically Signed   By: Franky Crease M.D.   On: 03/14/2024 22:36   DG Chest Port 1 View Result Date: 03/14/2024 CLINICAL DATA:  Shortness of breath, fever, cough EXAM: PORTABLE CHEST 1 VIEW COMPARISON:  02/26/2024 FINDINGS: Left Port-A-Cath remains in place, unchanged. Heart is borderline in size. Mediastinal contours are within normal limits. Mild peribronchial thickening. No confluent airspace opacities, effusions or edema. No acute bony abnormality. IMPRESSION: Borderline heart size. Mild bronchitic changes. Electronically Signed   By: Franky Crease M.D.   On: 03/14/2024 22:10      Assessment/Plan 1. 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  Based on this the only way to truly establish diagnosis would be with temporal artery biopsy.  I have discussed the temporal artery biopsy with the patient including the risk, benefits and alternatives and she is agreeable to proceed with temporal artery biopsy at this time.  Will plan on temporal artery biopsy on Wednesday.   Plan of care discussed with Dr.Dew and he is in agreement with plan noted above.   Family Communication:  Total Time:75 minutes I spent 75 minutes in this encounter including personally reviewing extensive medical records, personally reviewing imaging studies and compared to prior scans, counseling the patient, placing orders, coordinating care and performing appropriate documentation  Thank you for allowing us  to participate in the care of this patient.   Marketta Valadez E Loida Calamia, NP [facility] [phone] (Office Phone) [phone] (Office Fax) [phone] (Pager)  04/12/2024 11:47 AM  Staff may message me via secure chat in Epic  but this may not receive immediate response,  please page for urgent matters!  Dictation software was used to  generate the above note. Typos may occur and escape review, as with typed/written notes. Any error is purely unintentional.  Please contact me directly for clarity if needed.

## 2024-04-12 NOTE — Assessment & Plan Note (Signed)
 CBG elevated likely secondary to steroid use. - Continue with Semglee  -Increasing mealtime coverage to 6 unit -Continue with SSI

## 2024-04-12 NOTE — Assessment & Plan Note (Signed)
 -  CPAP at night

## 2024-04-12 NOTE — ED Notes (Signed)
 Per Elgergawy MD hold solumedrol until CRP results.

## 2024-04-12 NOTE — Consult Note (Signed)
 NEUROLOGY CONSULT NOTE   Date of service: April 12, 2024 Patient Name: Alison [name] MRN:  [id number] DOB:  30-Sep-1976 Chief Complaint: Headache Requesting Provider: Caleen Qualia, MD  History of Present Illness  Alison [name] is a 48 y.o. female with hx of morbid obesity, bilateral parotid lymphoma status post Rituxan  in 2020, diffuse large B-cell lymphoma, SLE status post liver transplant currently on tacrolimus , neuropathy, MDD, sleep apnea, fibromyalgia, presents to the emergency department for evaluation of headache, for 10 days. Describes the headache mainly behind her right eye, that initially started as a dull ache but at this point feels like somebody has punched her eye and is aching all the time.  Does not report any significant loss of vision or curtain like loss of vision in that eye.  Reports pain with eye movement in all directions. Denies any jaw claudication at this time.  In the ER, ESR was 44, CRP was normal. MRI of the brain and orbits which was done without contrast had a report that was concerning for possible edema around the intraconal fat of the right orbit where differentials were listed as optic perineuritis versus giant cell arteritis and she was admitted for evaluation of such with consults for ophthalmology, neurology and vascular surgery for possible temporal artery biopsy.  MRI brain did not show any evidence of demyelination or stroke.  ROS  Comprehensive ROS performed and pertinent positives documented in HPI   Past History   Past Medical History:  Diagnosis Date   Abnormal uterine bleeding    Allergy    Anxiety    Arthritis    Bipolar disorder (manic depression) (HCC)    Cancer (HCC) Diffuse B-Cell Lymphoma   2015   Chest pain    a. 09/2021 MV: Low risk without ischemia or infarct.  Normal EF.   Chronic kidney failure    Chronic renal disease, stage III (HCC)    Diabetes mellitus without complication (HCC)    DLBCL (diffuse large B cell  lymphoma) (HCC) 2015   Right axillary lymph node resected and chemo tx's.   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 02/2023 Echo: EF 55-60%, no rwma, nl RV size/fxn. Mildly dil LA.   Personality disorder (HCC)    Pineal gland cyst    Post herpetic neuralgia    PTSD (post-traumatic stress disorder)    RBBB    Renal disorder    S/P liver transplant (HCC) 1993   Sleep apnea    has not recieved cpap yet-other one was recalled    Past Surgical History:  Procedure Laterality Date   BONE MARROW BIOPSY  01/13/2015   BREAST BIOPSY Right 12/2014   US  Axilla Bx, Positive for Lymphoma   BREAST BIOPSY Right 2011   benign   BREAST BIOPSY Right 2023   Stereo bx, X-clip, Fat Necrosis   BREAST BIOPSY Right 01/20/2024   US  RT BREAST BX W LOC  DEV 1ST LESION IMG BX SPEC US  GUIDE 01/20/2024 [facility]   BREAST SURGERY Right 2016   Lymphoma, lymph node removal. pt then got a seroma and had to do more surgery   CHOLECYSTECTOMY     COLONOSCOPY     COLONOSCOPY WITH PROPOFOL  N/A 02/28/2022   Procedure: COLONOSCOPY WITH PROPOFOL ;  Surgeon: Jinny Carmine, MD;  Location: [facility] ENDOSCOPY;  Service: Endoscopy;  Laterality: N/A;   ESOPHAGOGASTRODUODENOSCOPY     ESOPHAGOGASTRODUODENOSCOPY (EGD) WITH PROPOFOL  N/A 01/09/2021   Procedure: ESOPHAGOGASTRODUODENOSCOPY (EGD) WITH PROPOFOL ;  Surgeon: Jinny Carmine, MD;  Location: [facility] ENDOSCOPY;  Service: Endoscopy;  Laterality: N/A;   HERNIA REPAIR     x4-all from liver transplant   HYSTEROSCOPY WITH D & C N/A 11/13/2021   Procedure: DILATATION AND  CURETTAGE /HYSTEROSCOPY;  Surgeon: Victor Claudell SAUNDERS, MD;  Location: [facility] ORS;  Service: Gynecology;  Laterality: N/A;   infusaport     LIVER TRANSPLANT  12/17/1991   LUMBAR PUNCTURE     PORT A CATH INJECTION ([facility] HX)     RENAL BIOPSY     tumor removal  2015   cancer right side    Family History: Family History  Problem Relation Age of Onset   Heart disease Mother    Hypertension Mother    Cancer - Other Mother    Bipolar disorder Mother    Anxiety disorder Mother    Arthritis Mother    Depression Mother    Heart disease Father    Hypertension Father    Diabetes Father    Parkinson's disease Maternal Grandmother    COPD Maternal Grandmother    Cancer Maternal Aunt    Cancer Maternal Uncle    Cancer Maternal Grandfather    Lupus Paternal Grandmother    Hypertension Brother    Alcohol abuse Brother    Depression Brother    Alcohol abuse Maternal Uncle     Social History  reports that she has never smoked. She has been exposed to tobacco smoke. She has never used smokeless tobacco. She reports that she does not currently use drugs. No history on file for alcohol use.  Allergies  Allergen Reactions   Penicillin G Itching, Rash, Anaphylaxis and Palpitations   Lamictal  [Lamotrigine ] Rash   Tape    Carbamazepine Other (See Comments)    Medication interaction-prograf     Hydrocodone-Acetaminophen  Itching   Naproxen Itching   Penicillins    Vancomycin  Rash    Macular/blotchy rash at infusion site    Medications   Current Facility-Administered Medications:    heparin  injection 5,000 Units, 5,000 Units, Subcutaneous, Q8H, Elgergawy, Dawood S, MD   HYDROmorphone  (DILAUDID ) injection 0.5 mg, 0.5 mg, Intravenous, Q4H PRN, Elgergawy, Dawood S, MD, 0.5 mg at 04/12/24 9061   insulin  aspart (novoLOG ) injection 0-20 Units, 0-20 Units, Subcutaneous, TID WC, Elgergawy, Dawood S, MD, 15 Units at 04/12/24 9162   insulin  aspart (novoLOG ) injection 0-5 Units, 0-5 Units,  Subcutaneous, QHS, Elgergawy, Dawood S, MD, 2 Units at 04/12/24 0058   insulin  aspart (novoLOG ) injection 6 Units, 6 Units, Subcutaneous, TID WC, Amin, Sumayya, MD   insulin  glargine-yfgn (SEMGLEE ) injection 40 Units, 40 Units, Subcutaneous, QHS, Elgergawy, Dawood S, MD, 40 Units at 04/12/24 0059   lurasidone  (LATUDA ) tablet 20 mg, 20 mg, Oral, Daily, Elgergawy, Dawood S, MD, 20 mg at 04/12/24 0839   magnesium  oxide (MAG-OX) tablet 800 mg, 800 mg, Oral, BID, Elgergawy, Dawood S, MD, 800 mg at 04/12/24 0838   melatonin tablet 10 mg, 10 mg, Oral,  QHS, Elgergawy, Brayton RAMAN, MD   metoCLOPramide  (REGLAN ) injection 5 mg, 5 mg, Intravenous, Q8H PRN, Elgergawy, Brayton RAMAN, MD, 5 mg at 04/12/24 0148   naloxone  (NARCAN ) nasal spray 4 mg/0.1 mL, 0.4 mg, Nasal, Once, Elgergawy, Brayton RAMAN, MD   oxyCODONE  (Oxy IR/ROXICODONE ) immediate release tablet 5 mg, 5 mg, Oral, Q6H PRN, Elgergawy, Dawood S, MD   pantoprazole  (PROTONIX ) EC tablet 40 mg, 40 mg, Oral, Daily, Elgergawy, Dawood S, MD, 40 mg at 04/12/24 0841   pregabalin  (LYRICA ) capsule 200 mg, 200 mg, Oral, BID, Elgergawy, Dawood S, MD, 200 mg at 04/12/24 9161   rosuvastatin  (CRESTOR ) tablet 10 mg, 10 mg, Oral, Daily, Elgergawy, Dawood S, MD, 10 mg at 04/12/24 9160   tacrolimus  (PROGRAF ) capsule 1 mg, 1 mg, Oral, q morning, Elgergawy, Dawood S, MD, 1 mg at 04/12/24 9060   venlafaxine  XR (EFFEXOR -XR) 24 hr capsule 37.5 mg, 37.5 mg, Oral, q morning, Elgergawy, Dawood S, MD, 37.5 mg at 04/12/24 0940   venlafaxine  XR (EFFEXOR -XR) 24 hr capsule 75 mg, 75 mg, Oral, Q breakfast, Elgergawy, Dawood S, MD  Current Outpatient Medications:    albuterol  (VENTOLIN  HFA) 108 (90 Base) MCG/ACT inhaler, Inhale 2 puffs into the lungs every 6 (six) hours as needed for wheezing or shortness of breath., Disp: 8 g, Rfl: 0   baclofen  (LIORESAL ) 10 MG tablet, Take 1 tablet by mouth 2 (two) times daily., Disp: , Rfl:    benztropine  (COGENTIN ) 1 MG tablet, TAKE 1 TABLET BY MOUTH ONCE DAILY  AS NEEDED FOR TREMORS, Disp: 30 tablet, Rfl: 10   fluticasone  (FLONASE ) 50 MCG/ACT nasal spray, Place into the nose., Disp: , Rfl:    glipiZIDE  (GLUCOTROL  XL) 10 MG 24 hr tablet, TAKE 1 TABLET BY MOUTH IN THE MORNING AND AT BEDTIME, Disp: 60 tablet, Rfl: 5   insulin  glargine (LANTUS  SOLOSTAR) 100 UNIT/ML Solostar Pen, Inject 40 Units into the skin daily., Disp: 15 mL, Rfl: 3   lurasidone  (LATUDA ) 20 MG TABS tablet, TAKE 1 TABLET BY MOUTH ONCE DAILY WITH SUPPER *REFILL REQUEST*, Disp: 90 tablet, Rfl: 10   magnesium  oxide (MAG-OX) 400 MG tablet, Take 1,000 mg by mouth 2 (two) times daily., Disp: , Rfl:    metFORMIN  (GLUCOPHAGE ) 1000 MG tablet, TAKE 1 TABLET BY MOUTH TWICE DAILY WITH MEALS *PATIENT WILL NEED TO SCHEDULE APPOINTMENT FOR ADDITIONAL REFILLS*, Disp: 180 tablet, Rfl: 0   mirabegron  ER (MYRBETRIQ ) 50 MG TB24 tablet, Take 1 tablet (50 mg total) by mouth daily., Disp: 90 tablet, Rfl: 3   mirtazapine  (REMERON ) 15 MG tablet, TAKE 1 TABLET BY MOUTH EVERY DAY AT BEDTIME *REFILL REQUEST*, Disp: 90 tablet, Rfl: 10   naloxone  (NARCAN ) nasal spray 4 mg/0.1 mL, Place 0.4 mg into the nose once., Disp: , Rfl:    NOVOLOG  FLEXPEN 100 UNIT/ML FlexPen, Inject 0-30 Units into the skin 3 (three) times daily with meals. Sliding scale, Disp: , Rfl:    ondansetron  (ZOFRAN ) 4 MG tablet, Take 1 tablet (4 mg total) by mouth every 6 (six) hours as needed for nausea., Disp: 20 tablet, Rfl: 0   oxyCODONE  (OXY IR/ROXICODONE ) 5 MG immediate release tablet, Take 5 mg by mouth 3 (three) times daily as needed., Disp: , Rfl:    pregabalin  (LYRICA ) 200 MG capsule, Take 1 capsule (200 mg total) by mouth 2 (two) times daily., Disp: 60 capsule, Rfl: 0   rosuvastatin  (CRESTOR ) 10 MG tablet, Take 1 tablet (10 mg total) by mouth daily., Disp: 90 tablet, Rfl: 2   Semaglutide ,  1 MG/DOSE, 4 MG/3ML SOPN, Inject 1 mg into the skin once a week., Disp: 3 mL, Rfl: 0   tacrolimus  (PROGRAF ) 1 MG capsule, Take 1 capsule by mouth every  morning., Disp: , Rfl:    TYLENOL  PM EXTRA STRENGTH 1000-50 MG/30ML LIQD, Take by mouth., Disp: , Rfl:    UBRELVY  50 MG TABS, Take 1 tablet by mouth daily as needed., Disp: , Rfl:    venlafaxine  XR (EFFEXOR -XR) 37.5 MG 24 hr capsule, Take 37.5 mg by mouth every morning., Disp: , Rfl:    venlafaxine  XR (EFFEXOR -XR) 75 MG 24 hr capsule, Take 1 capsule (75 mg total) by mouth daily with breakfast. Take along with 37.5 mg daily, Disp: 30 capsule, Rfl: 10   Accu-Chek Softclix Lancets lancets, 2 (two) times daily., Disp: , Rfl:    Continuous Glucose Sensor (FREESTYLE LIBRE 3 PLUS SENSOR) MISC, Change sensor every 15 days., Disp: 6 each, Rfl: 3   Cranberry-Vitamin C-Probiotic (AZO CRANBERRY PO), Take by mouth., Disp: , Rfl:    Insulin  Pen Needle 32G X 6 MM MISC, 1 each by Does not apply route as needed., Disp: 100 each, Rfl: 5   [Paused] lisinopril  (ZESTRIL ) 20 MG tablet, TAKE 1 TABLET BY MOUTH ONCE DAILY *PATIENT WILL NEED TO SCHEDULE AN APPOINTMENT FOR ADDITIONAL REFILLS*, Disp: 90 tablet, Rfl: 0   Melatonin 10 MG TABS, Take 10 mg by mouth at bedtime. (Patient not taking: Reported on 04/23/24), Disp: , Rfl:    PRECISION QID TEST test strip, , Disp: , Rfl:   Vitals   Vitals:   04/12/24 0600 04/12/24 0615 04/12/24 0630 04/12/24 0700  BP: 112/76   121/78  Pulse: 62 64 62 66  Resp: 17 16 13 20   Temp:      TempSrc:      SpO2:  92% 93% 93%  Weight:      Height:        Body mass index is 39.54 kg/m.   Physical Exam   General: Awake alert in no distress HEENT: Normocephalic atraumatic Lungs: Clear Cardiovascular: Regular rate rhythm Neurological exam Awake alert oriented x 3 No dysarthria No aphasia Cranial nerves: Pupils are pinpoint, sluggishly reactive, extraocular movements are intact-complains of some pain with extraocular movements in the right eye, visual fields appear full, facial sensation intact, face symmetric, tongue and palate midline. Motor examination with no strength  deficits Sensory exam with no sensory deficits Coordination examination reveals no dysmetria  Labs/Imaging/Neurodiagnostic studies   CBC:  Recent Labs  Lab Apr 23, 2024 2008 04/12/24 0500  WBC 9.6 8.0  HGB 12.2 11.9*  HCT 37.4 36.3  MCV 87.0 86.8  PLT 161 163   Basic Metabolic Panel:  Lab Results  Component Value Date   NA 136 04/12/2024   K 4.5 04/12/2024   CO2 24 04/12/2024   GLUCOSE 242 (H) 04/12/2024   BUN 14 04/12/2024   CREATININE 0.88 04/12/2024   CALCIUM  9.3 04/12/2024   GFRNONAA >60 04/12/2024   GFRAA 74 12/11/2020   Lipid Panel:  Lab Results  Component Value Date   LDLCALC 57 04/01/2024   HgbA1c:  Lab Results  Component Value Date   HGBA1C 9.7 (H) 04/01/2024   Urine Drug Screen: No results found for: LABOPIA, COCAINSCRNUR, LABBENZ, AMPHETMU, THCU, LABBARB  Alcohol Level No results found for: [facility] INR  Lab Results  Component Value Date   INR 1.0 03/15/2024   APTT  Lab Results  Component Value Date   APTT 59 (H) 10/15/2023   ESR 44-elevated  CRP 0.6-normal  Imaging personally reviewed Noncontrasted MRI of the brain and the orbits with concern for edema around the intraconal fat in the right orbital apex. I recommended MRI orbits with and without contrast be performed which was completed this morning. MRI of the orbits with and without contrast redemonstrated edema along the intraorbital right optic nerve with mild enhancement along the optic nerve sheath and within the adjacent intraconal fat suggestive of optic perineuritis.  No evidence of enhancement within the substance of the optic nerve  ASSESSMENT   Alison [name] is a 48 y.o. female presenting for evaluation of headache which she describes as pain behind her right eye, present with eye movement as well as at rest, with no reported jaw claudication or vision loss. On clinical examination, she had pinpoint pupils-likely secondary to receiving opiates in the hospital and on being  chronic opiates at home. She did have pain with movement of her eyes but no frank vision loss or visual field constrictions. MRI of the brain and orbits without contrast was concerning for edema in the intraconal right apex which was followed by the MRI of the orbits with and without contrast which revealed evidence of what appears to be right optic perineuritis with no evidence of enhancement within the optic nerve.  This implies no demyelination. Etiological consideration remains broad, given her extensive history of lymphoma as well as lupus.  GCA remains in the differentials but with no evidence of thrombocytosis over the period of time, no jaw claudication, mildly elevated ESR only with normal CRP-that is not extremely likely but the caveat here is that the patient is immune suppressed because of her transplant and inflammatory markers or inflammatory response may not be normal or ideal.  Impression: Right optic perineuritis-etiological considerations remain broad including autoimmune versus lymphoma versus giant cell arteritis  RECOMMENDATIONS  Unfortunately the only way to establish diagnosis would be tissue diagnosis. I would recommend getting a temporal artery biopsy for evaluation of GCA as the etiology for her right optic perineuritis. I would also recommend a high-volume spinal tap, with CSF testing for glucose, protein, cell count, Gram stain, cytology and flow cytometry given her history of autoimmune diseases and lymphoma.  I would also do the meningitis/encephalitis panel to ensure there is no contribution from any sort of infectious process although the likelihood of infectious process is low. She has been given a high-dose methylprednisone 1 g IV x 1.  I would do 1 g IV Solu-Medrol  for another 2 doses before transitioning to p.o. I would appreciate ophthalmology consultation has already been requested by the ER for their review on this. Appreciate vascular surgery consulting for  temporal artery biopsy. Plan discussed with Dr. Caleen Neurology will follow the above testing with you and provide further recommendations  ______________________________________________________________________    Signed, Eligio Lav, MD [facility]

## 2024-04-12 NOTE — Inpatient Diabetes Management (Signed)
 Inpatient Diabetes Program Recommendations  AACE/ADA: New Consensus Statement on Inpatient Glycemic Control (2015)  Target Ranges:  Prepandial:   less than 140 mg/dL      Peak postprandial:   less than 180 mg/dL (1-2 hours)      Critically ill patients:  140 - 180 mg/dL   Lab Results  Component Value Date   GLUCAP 363 (H) 04/12/2024   HGBA1C 9.7 (H) 04/01/2024    Review of Glycemic Control  Latest Reference Range & Units 04/11/24 18:38 04/12/24 00:47 04/12/24 08:08 04/12/24 11:29  Glucose-Capillary 70 - 99 mg/dL 789 (H) 765 (H) 660 (H) 363 (H)   Diabetes history: DM  Outpatient Diabetes medications:  FSL3 Glucotrol  XL 10 mg daily Lantus  40 units daily Metformin  1000 mg bid Novolog  0-30 units tid with meals  Semaglutide  1 mg weekly Current orders for Inpatient glycemic control:  Novolog  0-20 units tid with meals and HS Novolog  6 units tid with meals Semglee  40 units q HS Solumedrol 1000 mg q 24 hours  Inpatient Diabetes Program Recommendations:    Consider increasing Semglee  to 30 units bid (while on high dose steroids) and increase frequency of Novolog  correction to q 4 hours.    Thanks,  Randall Bullocks, RN, BC-ADM Inpatient Diabetes Coordinator Pager [phone]  (8a-5p)

## 2024-04-12 NOTE — Assessment & Plan Note (Signed)
 Continue home psych meds  ?

## 2024-04-12 NOTE — Progress Notes (Signed)
 Progress Note   [name] FMW:[id number] DOB: [birth year] DOA: 04/11/2024     1 DOS: the patient was seen and examined on 04/12/2024   Brief hospital course: Taken from H&P.  Zamaya Rapaport  is a 48 y.o. female, with medical history significant for morbid obesity, lymphoma bilateral parotid gland s/p Rituxan  in 2020, diffuse large B-cell lymphoma,  lupus s/p liver transplant, currently on tacrolimus , neuropathy, major depressive disorder, sleep apnea presents to ED secondary to complaints of right eye pain, and headache for about 10 days.  Denies any change in vision, fever or chills.  ED provider discussed with ophthalmology, they requested MRI of brain and orbit due to concern of autoimmune process, imaging without contrast with concern of temporal arteritis versus optic perineuritis.  Neurology and vascular surgery was consulted and patient was started on Solu-Medrol .  7/7: Vital stable, labs with elevated blood glucose level, likely secondary to steroid, CRP was normal at 0.6, and mildly elevated ESR which is better than his baseline with history of chronic illnesses is not compatible with temporal arteritis.  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  No evidence of enhancement within the optic nerve.  Pending ophthalmology evaluation. Neurology also ordered lumbar puncture. Continuing steroids for now Temporal artery biopsy is planned for Wednesday with vascular surgery.  Assessment and Plan: * Eye pain Optic perineuritis/giant cell arteritis. Imaging concerning for optic perineuritis, no involvement of haptic nerve.  No change in vision.  Inflammatory markers with mildly elevated ESR and normal CRP are not very consistent with temporal arteritis but in order to completely rule it out vascular surgery was consulted and they are planning temporal artery biopsy on Wednesday.  Patient has history of significant autoimmune  disorders. Ophthalmology consult pending Neurology is recommending LP and continuation of high-dose steroid. - Continue with Solu-Medrol  -Continue with supportive care  Diabetes mellitus without complication (HCC) CBG elevated likely secondary to steroid use. - Continue with Semglee  -Increasing mealtime coverage to 6 unit -Continue with SSI  S/P liver transplant (HCC) - Continue home tacrolimus   Chronic pain - Continuing home medications  Bipolar disorder (manic depression) (HCC) - Continue home psych meds  Morbid obesity (HCC) Estimated body mass index is 39.54 kg/m as calculated from the following:   Height as of this encounter: 5' 6 (1.676 m).   Weight as of this encounter: 111.1 kg.   - This will complicate overall prognosis -Encouraged weight loss  Hypertension Blood pressure currently within goal. Currently home lisinopril  has been held by our patient provider for about a month. - Continue to monitor -Will restart lisinopril  if needed  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HCC) - Outpatient follow-up  OSA on CPAP - CPAP at night   Subjective: Patient was still having some pain behind right eye but stating it is much improved than before.  No change in vision.  Light does bother her.  Physical Exam: Vitals:   04/12/24 0615 04/12/24 0630 04/12/24 0700 04/12/24 1139  BP:   121/78 (!) 133/94  Pulse: 64 62 66 72  Resp: 16 13 20 17   Temp:    98.6 F (37 C)  TempSrc:    Oral  SpO2: 92% 93% 93% 94%  Weight:      Height:       General.  Morbidly obese lady, in no acute distress. Pulmonary.  Lungs clear bilaterally, normal respiratory effort. CV.  Regular rate and rhythm, no JVD, rub or murmur. Abdomen.  Soft, nontender, nondistended, BS positive. CNS.  Alert and oriented .  No focal neurologic deficit. Extremities.  No edema, no cyanosis, pulses intact and symmetrical. Psychiatry.  Judgment and insight appears normal.   Data Reviewed: Prior data  reviewed  Family Communication: Discussed with patient  Disposition: Status is: Inpatient Remains inpatient appropriate because: Severity of illness  Planned Discharge Destination: Home  DVT prophylaxis.  Subcu heparin  Time spent: 50 minutes  This record has been created using Conservation officer, historic buildings. Errors have been sought and corrected,but may not always be located. Such creation errors do not reflect on the standard of care.   Author: Amaryllis Dare, MD 04/12/2024 1:08 PM  For on call review [email or web address].

## 2024-04-12 NOTE — ED Notes (Signed)
 Pt provided with eye mask. No other comfort measures requested at this time.

## 2024-04-12 NOTE — Assessment & Plan Note (Addendum)
 Optic perineuritis/giant cell arteritis. Imaging concerning for optic perineuritis, no involvement of haptic nerve.  No change in vision.  Inflammatory markers with mildly elevated ESR and normal CRP are not very consistent with temporal arteritis but in order to completely rule it out vascular surgery was consulted and they are planning temporal artery biopsy on Wednesday.  Patient has history of significant autoimmune disorders. Ophthalmology consult pending Neurology is recommending LP and continuation of high-dose steroid. - Continue with Solu-Medrol  -Continue with supportive care

## 2024-04-12 NOTE — Procedures (Signed)
    PROCEDURE SUMMARY:   Successful image-guided lumbar puncture at level of L4/5 Opening pressure 18.4 cm water.  Yielded 13.5 mL of clear  CSF.  No immediate complications.  EBL = trace. Patient tolerated well.    Specimen was sent for labs.   Please see imaging section of Epic for full dictation.   Post procedure orders placed.  - Bedrest with bathroom privileges for 2 hours  - Remain flat in the bed for rest of the day  - Tylenol  for headache, up to 4,000 mg a day

## 2024-04-12 NOTE — Assessment & Plan Note (Signed)
 Continuing home medications.

## 2024-04-12 NOTE — Assessment & Plan Note (Signed)
-   Continue home tacrolimus

## 2024-04-12 NOTE — ED Notes (Signed)
Informed rn bed assigned 

## 2024-04-12 NOTE — Progress Notes (Signed)
 Patient ordered on CPAP at night. Patient stated she doesn't wear one at home due to the mask making her pain worse. Patient declined hospital machine.

## 2024-04-12 NOTE — Consult Note (Signed)
 Reason for Consult:  Right eye pain and headache Referring Physician: Woodard HANSEN ED MD. Chief complaint: Eye pain, right eye.  HPI: Alison [name] is an 48 y.o. 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  She reports that for the past week she experienced right eye pain that worsened on Friday to the point of nausea with vomiting, so she sought care at the [facility] ED on Sunday.  ESR was mildly elevated, an MRI orbits was performed that showed some right perineuritis.  Neurology and vascular surg were consulted and an LP was performed.  She reports that vision has been intact, but she did complain of light sensitivity and pain with eye movement in the right eye.  She reports that her husband thought her right eye might be bulging compared to the left. She reports that the pain has improved with the administration of opiods and steroids.   She denies loss of peripheral vision or flashes of light.   She endorses the right orbital pain, but denies anorexia, fevers, jaw claudication.  Her last eye exam was with [facility] in [place] a couple of months ago, which was unremarkable.  Past Medical History:  Diagnosis Date   Abnormal uterine bleeding    Allergy    Anxiety    Arthritis    Bipolar disorder (manic depression) (HCC)    Cancer (HCC) Diffuse B-Cell Lymphoma   2015   Chest pain    a. 09/2021 MV: Low risk without ischemia or infarct.  Normal EF.   Chronic kidney failure    Chronic renal disease, stage III (HCC)    Diabetes mellitus without complication (HCC)    DLBCL (diffuse large B cell lymphoma) (HCC) 2015   Right axillary lymph node resected and chemo tx's.   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 02/2023 Echo: EF 55-60%, no rwma, nl RV size/fxn. Mildly dil LA.   Personality disorder (HCC)    Pineal gland cyst    Post herpetic neuralgia    PTSD (post-traumatic stress disorder)    RBBB    Renal disorder    S/P liver transplant (HCC) 1993   Sleep apnea    has not recieved cpap yet-other one was recalled    ROS  Past Surgical History:  Procedure Laterality Date   BONE MARROW BIOPSY  01/13/2015   BREAST BIOPSY Right 12/2014   US  Axilla Bx, Positive for Lymphoma   BREAST BIOPSY Right 2011   benign   BREAST BIOPSY Right 2023   Stereo bx, X-clip, Fat Necrosis   BREAST BIOPSY Right 01/20/2024   US  RT BREAST BX W LOC DEV 1ST LESION IMG BX SPEC US  GUIDE 01/20/2024 [facility]   BREAST SURGERY Right 2016   Lymphoma, lymph node removal. pt then got  a seroma and had to do more surgery   CHOLECYSTECTOMY     COLONOSCOPY     COLONOSCOPY WITH PROPOFOL  N/A 02/28/2022   Procedure: COLONOSCOPY WITH PROPOFOL ;  Surgeon: Jinny Carmine, MD;  Location: [facility] ENDOSCOPY;  Service: Endoscopy;  Laterality: N/A;   ESOPHAGOGASTRODUODENOSCOPY     ESOPHAGOGASTRODUODENOSCOPY (EGD) WITH PROPOFOL  N/A 01/09/2021   Procedure: ESOPHAGOGASTRODUODENOSCOPY (EGD) WITH PROPOFOL ;  Surgeon: Jinny Carmine, MD;  Location: [facility] ENDOSCOPY;  Service: Endoscopy;  Laterality: N/A;   HERNIA REPAIR     x4-all from liver transplant   HYSTEROSCOPY WITH D & C N/A 11/13/2021   Procedure: DILATATION AND CURETTAGE /HYSTEROSCOPY;  Surgeon: Victor Claudell SAUNDERS, MD;  Location: [facility] ORS;  Service: Gynecology;  Laterality: N/A;   infusaport     LIVER TRANSPLANT  12/17/1991   LUMBAR PUNCTURE     PORT A CATH  INJECTION ([facility] HX)     RENAL BIOPSY     tumor removal  2015   cancer right side    Family History  Problem Relation Age of Onset   Heart disease Mother    Hypertension Mother    Cancer - Other Mother    Bipolar disorder Mother    Anxiety disorder Mother    Arthritis Mother    Depression Mother    Heart disease Father    Hypertension Father    Diabetes Father    Parkinson's disease Maternal Grandmother    COPD Maternal Grandmother    Cancer Maternal Aunt    Cancer Maternal Uncle    Cancer Maternal Grandfather    Lupus Paternal Grandmother    Hypertension Brother    Alcohol abuse Brother    Depression Brother    Alcohol abuse Maternal Uncle     Social History:  reports that she has never smoked. She has been exposed to tobacco smoke. She has never used smokeless tobacco. She reports that she does not currently use drugs. No history on file for alcohol use.  Allergies:  Allergies  Allergen Reactions   Penicillin G Itching, Rash, Anaphylaxis and Palpitations   Lamictal  [Lamotrigine ] Rash   Tape    Carbamazepine Other (See Comments)    Medication interaction-prograf     Hydrocodone-Acetaminophen  Itching   Naproxen Itching   Penicillins    Vancomycin  Rash    Macular/blotchy rash at infusion site    Prior to Admission medications   Medication Sig Start Date End Date Taking? Authorizing Provider  albuterol  (VENTOLIN  HFA) 108 (90 Base) MCG/ACT inhaler Inhale 2 puffs into the lungs every 6 (six) hours as needed for wheezing or shortness of breath. 09/08/23  Yes Pender, Julie F, FNP  baclofen  (LIORESAL ) 10 MG tablet Take 1 tablet by mouth 2 (two) times daily. 08/15/21  Yes [provider]  benztropine  (COGENTIN ) 1 MG tablet TAKE 1 TABLET BY MOUTH ONCE DAILY AS NEEDED FOR TREMORS 08/01/23  Yes [name], [name], MD  fluticasone  (FLONASE ) 50 MCG/ACT nasal spray Place into the nose. 10/30/21  Yes [provider]  glipiZIDE  (GLUCOTROL  XL) 10 MG 24 hr tablet TAKE 1  TABLET BY MOUTH IN THE MORNING AND AT BEDTIME 03/30/24  Yes Motwani, Komal, MD  insulin  glargine (LANTUS  SOLOSTAR) 100 UNIT/ML Solostar Pen Inject 40 Units into the skin daily. 04/02/24  Yes Pender, Julie F, FNP  lurasidone  (LATUDA ) 20 MG TABS tablet TAKE 1 TABLET BY MOUTH ONCE DAILY WITH SUPPER *REFILL REQUEST* 06/04/23  Yes [name], [name], MD  magnesium  oxide (MAG-OX) 400 MG tablet Take 1,000 mg by mouth 2 (two)  times daily.   Yes [provider]  metFORMIN  (GLUCOPHAGE ) 1000 MG tablet TAKE 1 TABLET BY MOUTH TWICE DAILY WITH MEALS *PATIENT WILL NEED TO SCHEDULE APPOINTMENT FOR ADDITIONAL REFILLS* 02/27/24  Yes Gareth Mliss FALCON, FNP  mirabegron  ER (MYRBETRIQ ) 50 MG TB24 tablet Take 1 tablet (50 mg total) by mouth daily. 01/22/23  Yes Gareth Mliss FALCON, FNP  mirtazapine  (REMERON ) 15 MG tablet TAKE 1 TABLET BY MOUTH EVERY DAY AT BEDTIME *REFILL REQUEST* 06/04/23  Yes [name], [name], MD  naloxone  (NARCAN ) nasal spray 4 mg/0.1 mL Place 0.4 mg into the nose once. 09/05/21  Yes [provider]  NOVOLOG  FLEXPEN 100 UNIT/ML FlexPen Inject 0-30 Units into the skin 3 (three) times daily with meals. Sliding scale 01/21/23  Yes [provider]  ondansetron  (ZOFRAN ) 4 MG tablet Take 1 tablet (4 mg total) by mouth every 6 (six) hours as needed for nausea. 10/16/23  Yes Caleen Qualia, MD  oxyCODONE  (OXY IR/ROXICODONE ) 5 MG immediate release tablet Take 5 mg by mouth 3 (three) times daily as needed.   Yes [provider]  pregabalin  (LYRICA ) 200 MG capsule Take 1 capsule (200 mg total) by mouth 2 (two) times daily. 12/03/21  Yes Floy Roberts, MD  rosuvastatin  (CRESTOR ) 10 MG tablet Take 1 tablet (10 mg total) by mouth daily. 08/21/23  Yes Agbor-Etang, Redell, MD  Semaglutide , 1 MG/DOSE, 4 MG/3ML SOPN Inject 1 mg into the skin once a week. 04/05/24  Yes Motwani, Komal, MD  tacrolimus  (PROGRAF ) 1 MG capsule Take 1 capsule by mouth every morning. 02/20/23  Yes [provider]  TYLENOL   PM EXTRA STRENGTH 1000-50 MG/30ML LIQD Take by mouth.   Yes [provider]  UBRELVY  50 MG TABS Take 1 tablet by mouth daily as needed. 01/19/22  Yes [provider]  venlafaxine  XR (EFFEXOR -XR) 37.5 MG 24 hr capsule Take 37.5 mg by mouth every morning. 12/01/23  Yes [provider]  venlafaxine  XR (EFFEXOR -XR) 75 MG 24 hr capsule Take 1 capsule (75 mg total) by mouth daily with breakfast. Take along with 37.5 mg daily 09/09/23  Yes [name], [name], MD  Accu-Chek Softclix Lancets lancets 2 (two) times daily. 06/01/23   [provider]  Continuous Glucose Sensor (FREESTYLE LIBRE 3 PLUS SENSOR) MISC CHANGE SENSOR EVERY 15 DAYS 04/12/24   Dartha Ernst, MD  Cranberry-Vitamin C-Probiotic (AZO CRANBERRY PO) Take by mouth.    [provider]  Insulin  Pen Needle 32G X 6 MM MISC 1 each by Does not apply route as needed. 05/22/23   Pender, Julie F, FNP  lisinopril  (ZESTRIL ) 20 MG tablet TAKE 1 TABLET BY MOUTH ONCE DAILY *PATIENT WILL NEED TO SCHEDULE AN APPOINTMENT FOR ADDITIONAL REFILLS* 02/27/24   Gareth Mliss FALCON, FNP  Melatonin 10 MG TABS Take 10 mg by mouth at bedtime. Patient not taking: Reported on 04/11/2024    [provider]  PRECISION QID TEST test strip  08/06/19   [provider]    Results for orders placed or performed during the hospital encounter of 04/11/24 (from the past 48 hours)  POC CBG, ED     Status: Abnormal   Collection Time: 04/11/24  6:38 PM  Result Value Ref Range   Glucose-Capillary 210 (H) 70 - 99 mg/dL    Comment: Glucose reference range applies only to samples taken after fasting for at least 8 hours.  Comprehensive metabolic panel     Status: Abnormal   Collection Time: 04/11/24  8:08 PM  Result Value Ref  Range   Sodium 137 135 - 145 mmol/L   Potassium 4.7 3.5 - 5.1 mmol/L   Chloride 103 98 - 111 mmol/L   CO2 24 22 - 32 mmol/L   Glucose, Bld 230 (H) 70 - 99 mg/dL    Comment: Glucose reference range applies only  to samples taken after fasting for at least 8 hours.   BUN 13 6 - 20 mg/dL   Creatinine, Ser 9.07 0.44 - 1.00 mg/dL   Calcium  9.0 8.9 - 10.3 mg/dL   Total Protein 7.1 6.5 - 8.1 g/dL   Albumin 3.6 3.5 - 5.0 g/dL   AST 20 15 - 41 U/L   ALT 16 0 - 44 U/L   Alkaline Phosphatase 64 38 - 126 U/L   Total Bilirubin 0.7 0.0 - 1.2 mg/dL   GFR, Estimated >39 >39 mL/min    Comment: (NOTE) Calculated using the CKD-EPI Creatinine Equation (2021)    Anion gap 10 5 - 15    Comment: Performed at [facility], [address] [place], [place] [address]  Lipase, blood     Status: Abnormal   Collection Time: 04/11/24  8:08 PM  Result Value Ref Range   Lipase 64 (H) 11 - 51 U/L    Comment: Performed at [facility], [address] [place], [place] [address]  CBC     Status: None   Collection Time: 04/11/24  8:08 PM  Result Value Ref Range   WBC 9.6 4.0 - 10.5 K/uL   RBC 4.30 3.87 - 5.11 MIL/uL   Hemoglobin 12.2 12.0 - 15.0 g/dL   HCT 62.5 63.9 - 53.9 %   MCV 87.0 80.0 - 100.0 fL   MCH 28.4 26.0 - 34.0 pg   MCHC 32.6 30.0 - 36.0 g/dL   RDW 85.1 88.4 - 84.4 %   Platelets 161 150 - 400 K/uL   nRBC 0.0 0.0 - 0.2 %    Comment: Performed at [facility], [address] [place], [place] [address]  Sedimentation rate     Status: Abnormal   Collection Time: 04/11/24  8:08 PM  Result Value Ref Range   Sed Rate 44 (H) 0 - 22 mm/hr    Comment: Performed at [facility], [address] [place], [place] [address]  CBG monitoring, ED     Status: Abnormal   Collection Time: 04/12/24 12:47 AM  Result Value Ref Range   Glucose-Capillary 234 (H) 70 - 99 mg/dL    Comment: Glucose reference range applies only to samples taken after fasting for at least 8 hours.  C-reactive protein     Status: None   Collection Time: 04/12/24  1:05 AM  Result Value Ref Range   CRP 0.6 <1.0 mg/dL    Comment: Performed at [facility], [address] [place], [place] [address]   Comprehensive metabolic panel     Status: Abnormal   Collection Time: 04/12/24  5:00 AM  Result Value Ref Range   Sodium 136 135 - 145 mmol/L   Potassium 4.5 3.5 - 5.1 mmol/L   Chloride 102 98 - 111 mmol/L   CO2 24 22 - 32 mmol/L   Glucose, Bld 242 (H) 70 - 99 mg/dL    Comment: Glucose reference range applies only to samples taken after fasting for at least 8 hours.   BUN 14 6 - 20 mg/dL   Creatinine, Ser 9.11 0.44 - 1.00 mg/dL   Calcium  9.3 8.9 - 10.3 mg/dL  Total Protein 7.1 6.5 - 8.1 g/dL   Albumin 3.5 3.5 - 5.0 g/dL   AST 19 15 - 41 U/L   ALT 15 0 - 44 U/L   Alkaline Phosphatase 67 38 - 126 U/L   Total Bilirubin 1.0 0.0 - 1.2 mg/dL   GFR, Estimated >39 >39 mL/min    Comment: (NOTE) Calculated using the CKD-EPI Creatinine Equation (2021)    Anion gap 10 5 - 15    Comment: Performed at [facility], [address] [place], [place] [address]  CBC     Status: Abnormal   Collection Time: 04/12/24  5:00 AM  Result Value Ref Range   WBC 8.0 4.0 - 10.5 K/uL   RBC 4.18 3.87 - 5.11 MIL/uL   Hemoglobin 11.9 (L) 12.0 - 15.0 g/dL   HCT 63.6 63.9 - 53.9 %   MCV 86.8 80.0 - 100.0 fL   MCH 28.5 26.0 - 34.0 pg   MCHC 32.8 30.0 - 36.0 g/dL   RDW 85.1 88.4 - 84.4 %   Platelets 163 150 - 400 K/uL   nRBC 0.0 0.0 - 0.2 %    Comment: Performed at [facility], [address] [place], [place] [address]  CBG monitoring, ED     Status: Abnormal   Collection Time: 04/12/24  8:08 AM  Result Value Ref Range   Glucose-Capillary 339 (H) 70 - 99 mg/dL    Comment: Glucose reference range applies only to samples taken after fasting for at least 8 hours.  CBG monitoring, ED     Status: Abnormal   Collection Time: 04/12/24 11:29 AM  Result Value Ref Range   Glucose-Capillary 363 (H) 70 - 99 mg/dL    Comment: Glucose reference range applies only to samples taken after fasting for at least 8 hours.  Protein and glucose, CSF     Status: Abnormal   Collection Time: 04/12/24  12:53 PM  Result Value Ref Range   Glucose, CSF 145 (H) 40 - 70 mg/dL   Total  Protein, CSF 60 (H) 15 - 45 mg/dL    Comment: Performed at [facility], [address] [place], [place] [address]  CSF cell count with differential collection tube #: 1     Status: Abnormal   Collection Time: 04/12/24 12:53 PM  Result Value Ref Range   Tube # 1    Color, CSF COLORLESS COLORLESS   Appearance, CSF CLEAR CLEAR   Supernatant NOT INDICATED    RBC Count, CSF 29 (H) 0 - 3 /cu mm   WBC, CSF 5 0 - 5 /cu mm   Other Cells, CSF TOO FEW TO COUNT, SMEAR AVAILABLE FOR REVIEW     Comment: FEW MONOCYTES AND LYMPHOCYTES  Performed at [facility], [address] [place], [place] [address]   CSF cell count with differential collection tube #: 4     Status: Abnormal   Collection Time: 04/12/24 12:53 PM  Result Value Ref Range   Tube # 4    Color, CSF COLORLESS COLORLESS   Appearance, CSF CLEAR CLEAR   Supernatant NOT INDICATED    RBC Count, CSF 13 (H) 0 - 3 /cu mm   WBC, CSF 3 0 - 5 /cu mm   Other Cells, CSF TOO FEW TO COUNT, SMEAR AVAILABLE FOR REVIEW     Comment: FEW MONOCYTES AND LYMPHOCYTES Performed at [facility], [address] [place], [place] [address]   CSF culture w Gram Stain  Status: None (Preliminary result)   Collection Time: 04/12/24 12:53 PM   Specimen: PATH Cytology CSF; Cerebrospinal Fluid  Result Value Ref Range   Specimen Description CSF    Special Requests 3    Gram Stain      NO ORGANISMS SEEN WBC SEEN RED BLOOD CELLS Performed at [facility], [address] [place], [place] [address]    Culture PENDING    Report Status PENDING     DG FL GUIDED LUMBAR PUNCTURE Result Date: 04/12/2024 CLINICAL DATA:  [id number] Optic neuritis 2148 48 year old female. Endorsing headache times 10 days. Found to have what appears to be right optic perineuritis. Team is requesting lumbar puncture for further evaluation EXAM: LUMBAR PUNCTURE UNDER  FLUOROSCOPY PROCEDURE: An appropriate skin entry site was determined fluoroscopically. Operator donned sterile gloves and mask. Skin site was marked, then prepped with Betadine , draped in usual sterile fashion, and infiltrated locally with 1% lidocaine . A 20 gauge 6 inch spinal needle advanced into the thecal sac at L4-5 from a right interlaminar approach. Clear colorless CSF spontaneously returned, with opening pressure of 18.4 Cm water. 13.5 ml CSF were collected and divided among 4 sterile vials for the requested laboratory studies. The needle was then removed. The patient tolerated the procedure well and there were no complications. FLUOROSCOPY: Radiation Exposure Index and estimated peak skin dose (PSD); Reference air kerma (RAK), 19.5 mGy. Kerma-area product (KAP), 287.9 uGy*m. IMPRESSION: Successful diagnostic lumbar puncture under fluoroscopy. This exam was performed by Delon Beagle NP and was supervised and interpreted by Dr. Thom Hall Electronically Signed   By: Thom Hall M.D.   On: 04/12/2024 13:20   MR ORBITS W WO CONTRAST Result Date: 04/12/2024 CLINICAL DATA:  Concern for optic neuritis, edema in the intraconal fat of the right orbit. Right eye pain and headache for several days. EXAM: MRI OF THE ORBITS WITHOUT AND WITH CONTRAST TECHNIQUE: Multiplanar, multi-echo pulse sequences of the orbits and surrounding structures were acquired including fat saturation techniques, before and after intravenous contrast administration. CONTRAST:  10mL GADAVIST  GADOBUTROL  1 MMOL/ML IV SOLN COMPARISON:  MRI head and orbits 04/11/2024. FINDINGS: Orbits: Globes are intact. Lenses are normally located. No evidence of intra-ocular hemorrhage or mass. Extraocular muscles are unremarkable. Redemonstrated mild edema surrounding the intraorbital right optic nerve particularly along the inferior medial aspect of the nerve. There is mild associated enhancement along the optic nerve sheath and faint enhancement  within the adjacent intraconal fat. There is no evidence of enhancement within the substance of the optic nerve. No abnormal enhancement or edema within the left orbit. Normal appearance of the lacrimal glands. Vascular structures are unremarkable. Visualized sinuses: Small focus of mucosal thickening within the left sphenoid sinus. No air-fluid levels. Small bilateral mastoid effusions, right greater than left. Soft tissues: Normal. Limited intracranial: No acute or significant finding. IMPRESSION: Redemonstrated edema along the intraorbital right optic nerve with mild enhancement along the optic nerve sheath and within the adjacent intraconal fat suggestive of optic perineuritis. No evidence of enhancement within the substance of the optic nerve. Electronically Signed   By: Donnice Mania M.D.   On: 04/12/2024 10:10   MR BRAIN WO CONTRAST Result Date: 04/11/2024 CLINICAL DATA:  Abdominal plegia and headache.  Right eye pain. EXAM: MRI HEAD AND ORBITS WITHOUT CONTRAST TECHNIQUE: Multiplanar, multi-echo pulse sequences of the brain and surrounding structures were acquired without intravenous contrast. Multiplanar, multi-echo pulse sequences of the orbits and surrounding structures were acquired including fat saturation techniques, without intravenous contrast  administration. COMPARISON:  05/29/2022 FINDINGS: MRI HEAD FINDINGS Brain: No acute infarct, mass effect or extra-axial collection. No chronic microhemorrhage or siderosis. Normal white matter signal, parenchymal volume and CSF spaces. The midline structures are normal. Vascular: Normal flow voids. Skull and upper cervical spine: Normal marrow signal. Other: None. MRI ORBITS FINDINGS Orbits: There is mild edema within the intraconal fat of the right orbit. Otherwise, the orbits are normal. Visualized sinuses: Small amount of bilateral mastoid fluid. Paranasal sinuses are clear. Soft tissues: Normal. IMPRESSION: 1. Mild edema within the intraconal fat of the  right orbit, compatible with optic perineuritis. This finding may also be associated with temporal arteritis. 2. Normal MRI of the brain. Electronically Signed   By: Franky Stanford M.D.   On: 04/11/2024 22:45   MR ORBITS WO CONTRAST Result Date: 04/11/2024 CLINICAL DATA:  Abdominal plegia and headache.  Right eye pain. EXAM: MRI HEAD AND ORBITS WITHOUT CONTRAST TECHNIQUE: Multiplanar, multi-echo pulse sequences of the brain and surrounding structures were acquired without intravenous contrast. Multiplanar, multi-echo pulse sequences of the orbits and surrounding structures were acquired including fat saturation techniques, without intravenous contrast administration. COMPARISON:  05/29/2022 FINDINGS: MRI HEAD FINDINGS Brain: No acute infarct, mass effect or extra-axial collection. No chronic microhemorrhage or siderosis. Normal white matter signal, parenchymal volume and CSF spaces. The midline structures are normal. Vascular: Normal flow voids. Skull and upper cervical spine: Normal marrow signal. Other: None. MRI ORBITS FINDINGS Orbits: There is mild edema within the intraconal fat of the right orbit. Otherwise, the orbits are normal. Visualized sinuses: Small amount of bilateral mastoid fluid. Paranasal sinuses are clear. Soft tissues: Normal. IMPRESSION: 1. Mild edema within the intraconal fat of the right orbit, compatible with optic perineuritis. This finding may also be associated with temporal arteritis. 2. Normal MRI of the brain. Electronically Signed   By: Franky Stanford M.D.   On: 04/11/2024 22:45   CT Head Wo Contrast Result Date: 04/11/2024 CLINICAL DATA:  headache EXAM: CT HEAD WITHOUT CONTRAST TECHNIQUE: Contiguous axial images were obtained from the base of the skull through the vertex without intravenous contrast. RADIATION DOSE REDUCTION: This exam was performed according to the departmental dose-optimization program which includes automated exposure control, adjustment of the mA and/or kV  according to patient size and/or use of iterative reconstruction technique. COMPARISON:  MRI head 05/29/2022. FINDINGS: Brain: No evidence of acute infarction, hemorrhage, hydrocephalus, extra-axial collection or mass lesion/mass effect. Chronic partially empty sella. Vascular: No hyperdense vessel or unexpected calcification. Skull: No acute fracture. Sinuses/Orbits: Clear sinuses.  No acute orbital finding. IMPRESSION: No evidence of acute intracranial abnormality. Electronically Signed   By: Gilmore GORMAN Molt M.D.   On: 04/11/2024 16:51    Blood pressure (!) 133/94, pulse 72, temperature 98.6 F (37 C), temperature source Oral, resp. rate 17, height 5' 6 (1.676 m), weight 111.1 kg, last menstrual period 10/16/2021, SpO2 94%.   Mental status: Alert and Oriented x 4, pleasant and cooperative.  Visual Acuity:  20/20- OD  20/20  distance [facility] ED eye room chart.  About J4-5 OU with near card Marianne.  She normally wears glasses for reading and craftwork.  Pupils:  Equally round/ reactive to light.  No Afferent defect.  Motility:  Full/ orthophoric  Visual Fields:  Full to confrontation  IOP:  22 mm OU goldman tonometer eye room slit lamp.  External/ Lids/ Lashes:  Normal 2+ TA pulses, nontender.  10 mm palpebral fissure on the right, 9 on the left, ballotable.  Hertel not  available, but no proptosis evident on gross examination.  Anterior Segment:  Conjunctiva:  Normal  OU  Cornea:  Normal  OU, 1+ PEE OU, RTBUT  Anterior Chamber: Normal  OU, deep and quiet  Lens:   Normal OU, a few cortical dots.  Posterior Segment: Dilated OU with 1% Tropicamide and 2.5% Phenylephrine with 20D, 60D and 90D lenses in eye room.   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  Etiology is broad but is most likely lupus, and less likely infectious, other autoimmune/inflammatory  and is frequently idiopathic.  Patient's fibromyalgia exacerbated her sensation of pain for this episode, but her pain has improved significantly.    No signs of intraocular inflammation or infection or increase in eye pressure.  No signs of demyelinating disease.  Reassuring eye exam, with normal appearing and functioning optic nerves on clinical exam.  Overall, relatively low suspicion for temporal arteritis given age 41, normal CRP and history of elevated ESRs.  Given the patient's symptomatic improvement with administration of steroids and the diagnostic uncertainty, a temporal artery biopsy is reasonable to rule out GCA.   Appreciate neurology and vascular surgery's assistance on this case.  Agree with neurology's plan for steroids. Please reconsult ophthalmology if patient develops new ophthalmic symptoms.  Discussed with patient to followup with me outpatient in 2 weeks, [facility].  Adine Novak 04/12/2024, 2:56 PM

## 2024-04-13 DIAGNOSIS — H5711 Ocular pain, right eye: Secondary | ICD-10-CM | POA: Diagnosis not present

## 2024-04-13 DIAGNOSIS — I10 Essential (primary) hypertension: Secondary | ICD-10-CM | POA: Diagnosis not present

## 2024-04-13 DIAGNOSIS — E119 Type 2 diabetes mellitus without complications: Secondary | ICD-10-CM | POA: Diagnosis not present

## 2024-04-13 DIAGNOSIS — Z944 Liver transplant status: Secondary | ICD-10-CM | POA: Diagnosis not present

## 2024-04-13 DIAGNOSIS — M792 Neuralgia and neuritis, unspecified: Secondary | ICD-10-CM

## 2024-04-13 DIAGNOSIS — F319 Bipolar disorder, unspecified: Secondary | ICD-10-CM | POA: Diagnosis not present

## 2024-04-13 DIAGNOSIS — G4733 Obstructive sleep apnea (adult) (pediatric): Secondary | ICD-10-CM | POA: Diagnosis not present

## 2024-04-13 DIAGNOSIS — H469 Unspecified optic neuritis: Secondary | ICD-10-CM | POA: Diagnosis not present

## 2024-04-13 DIAGNOSIS — R519 Headache, unspecified: Secondary | ICD-10-CM | POA: Diagnosis not present

## 2024-04-13 DIAGNOSIS — G894 Chronic pain syndrome: Secondary | ICD-10-CM | POA: Diagnosis not present

## 2024-04-13 DIAGNOSIS — M329 Systemic lupus erythematosus, unspecified: Secondary | ICD-10-CM | POA: Diagnosis not present

## 2024-04-13 DIAGNOSIS — G588 Other specified mononeuropathies: Secondary | ICD-10-CM | POA: Diagnosis not present

## 2024-04-13 LAB — GLUCOSE, CAPILLARY
Glucose-Capillary: 380 mg/dL — ABNORMAL HIGH (ref 70–99)
Glucose-Capillary: 485 mg/dL — ABNORMAL HIGH (ref 70–99)
Glucose-Capillary: 477 mg/dL — ABNORMAL HIGH (ref 70–99)
Glucose-Capillary: 473 mg/dL — ABNORMAL HIGH (ref 70–99)
Glucose-Capillary: 434 mg/dL — ABNORMAL HIGH (ref 70–99)
Glucose-Capillary: 378 mg/dL — ABNORMAL HIGH (ref 70–99)
Glucose-Capillary: 319 mg/dL — ABNORMAL HIGH (ref 70–99)

## 2024-04-13 LAB — CYTOLOGY - NON PAP

## 2024-04-13 LAB — BASIC METABOLIC PANEL WITH GFR
Anion gap: 14 (ref 5–15)
BUN: 27 mg/dL — ABNORMAL HIGH (ref 6–20)
CO2: 23 mmol/L (ref 22–32)
Calcium: 9.3 mg/dL (ref 8.9–10.3)
Chloride: 95 mmol/L — ABNORMAL LOW (ref 98–111)
Creatinine, Ser: 1.12 mg/dL — ABNORMAL HIGH (ref 0.44–1.00)
GFR, Estimated: 60 mL/min — ABNORMAL LOW (ref >60)
Glucose, Bld: 337 mg/dL — ABNORMAL HIGH (ref 70–99)
Potassium: 4.5 mmol/L (ref 3.5–5.1)
Sodium: 132 mmol/L — ABNORMAL LOW (ref 135–145)

## 2024-04-13 LAB — CBC
HCT: 35.1 % — ABNORMAL LOW (ref 36.0–46.0)
Hemoglobin: 11.7 g/dL — ABNORMAL LOW (ref 12.0–15.0)
MCH: 28.2 pg (ref 26.0–34.0)
MCHC: 33.3 g/dL (ref 30.0–36.0)
MCV: 84.6 fL (ref 80.0–100.0)
Platelets: 185 K/uL (ref 150–400)
RBC: 4.15 MIL/uL (ref 3.87–5.11)
RDW: 14.5 % (ref 11.5–15.5)
WBC: 9.3 K/uL (ref 4.0–10.5)
nRBC: 0 % (ref 0.0–0.2)

## 2024-04-13 MED ORDER — CHLORHEXIDINE GLUCONATE CLOTH 2 % EX PADS
6.0000 | MEDICATED_PAD | Freq: Once | CUTANEOUS | Status: AC
  Administered 2024-04-14: 6 via TOPICAL

## 2024-04-13 MED ORDER — INSULIN ASPART 100 UNIT/ML IJ SOLN
20.0000 [IU] | Freq: Once | INTRAMUSCULAR | Status: AC
  Administered 2024-04-13: 20 [IU] via SUBCUTANEOUS
  Filled 2024-04-13: qty 1

## 2024-04-13 MED ORDER — HEPARIN SODIUM (PORCINE) 5000 UNIT/ML IJ SOLN
5000.0000 [IU] | Freq: Three times a day (TID) | INTRAMUSCULAR | Status: DC
  Administered 2024-04-15 – 2024-04-16 (×5): 5000 [IU] via SUBCUTANEOUS
  Filled 2024-04-13 (×5): qty 1

## 2024-04-13 MED ORDER — INSULIN GLARGINE-YFGN 100 UNIT/ML ~~LOC~~ SOLN
30.0000 [IU] | Freq: Two times a day (BID) | SUBCUTANEOUS | Status: DC
  Administered 2024-04-13 (×2): 30 [IU] via SUBCUTANEOUS
  Filled 2024-04-13 (×3): qty 0.3

## 2024-04-13 MED ORDER — INSULIN ASPART 100 UNIT/ML IJ SOLN
10.0000 [IU] | Freq: Three times a day (TID) | INTRAMUSCULAR | Status: DC
  Administered 2024-04-13 (×2): 10 [IU] via SUBCUTANEOUS
  Filled 2024-04-13 (×2): qty 1

## 2024-04-13 MED ORDER — INSULIN ASPART 100 UNIT/ML IJ SOLN
10.0000 [IU] | Freq: Once | INTRAMUSCULAR | Status: AC
  Administered 2024-04-13: 10 [IU] via SUBCUTANEOUS
  Filled 2024-04-13: qty 1

## 2024-04-13 MED ORDER — CHLORHEXIDINE GLUCONATE CLOTH 2 % EX PADS
6.0000 | MEDICATED_PAD | Freq: Once | CUTANEOUS | Status: AC
  Administered 2024-04-13: 6 via TOPICAL

## 2024-04-13 MED ORDER — PREDNISONE 50 MG PO TABS
50.0000 mg | ORAL_TABLET | Freq: Every day | ORAL | Status: DC
  Administered 2024-04-14 – 2024-04-16 (×3): 50 mg via ORAL
  Filled 2024-04-13 (×3): qty 1

## 2024-04-13 NOTE — Plan of Care (Addendum)
  Patient is alert and oriented X 4. At 1153 blood sugar was 485, no any other symptoms noted. MD made aware. Gave additional 10 units of insulin  as ordered. Rechecked blood sugar  was 477 at 1255.   Rechecked again at 1415, the blood sugar was 473. Dr Caleen made aware. Gave additional 20 units of insulin  as ordered.  At 1614 her blood sugar was 434, gave 20 units of insulin  as per dr Caleen. Rechecked blood sugar was 378 at 1801.  Denies any pain or any other symptoms. Plan of care ongoing.    Problem: Education: Goal: Ability to describe self-care measures that may prevent or decrease complications (Diabetes Survival Skills Education) will improve Outcome: Progressing Goal: Individualized Educational Video(s) Outcome: Progressing   Problem: Coping: Goal: Ability to adjust to condition or change in health will improve Outcome: Progressing   Problem: Fluid Volume: Goal: Ability to maintain a balanced intake and output will improve Outcome: Progressing   Problem: Health Behavior/Discharge Planning: Goal: Ability to identify and utilize available resources and services will improve Outcome: Progressing Goal: Ability to manage health-related needs will improve Outcome: Progressing   Problem: Metabolic: Goal: Ability to maintain appropriate glucose levels will improve Outcome: Progressing   Problem: Skin Integrity: Goal: Risk for impaired skin integrity will decrease Outcome: Progressing   Problem: Tissue Perfusion: Goal: Adequacy of tissue perfusion will improve Outcome: Progressing   Problem: Education: Goal: Knowledge of General Education information will improve Description: Including pain rating scale, medication(s)/side effects and non-pharmacologic comfort measures Outcome: Progressing

## 2024-04-13 NOTE — Assessment & Plan Note (Signed)
 Optic perineuritis/giant cell arteritis. Imaging concerning for optic perineuritis, no involvement of haptic nerve.  No change in vision.  Inflammatory markers with mildly elevated ESR and normal CRP are not very consistent with temporal arteritis but in order to completely rule it out vascular surgery was consulted and they are planning temporal artery biopsy on Wednesday.  Patient has history of significant autoimmune disorders. S/p LP, preliminary CSF cultures negative Ophthalmology signed off as there is no ophthalmology reason, likely autoimmune Neurology signed off today with following recommendations - Continue with high-dose steroid for total of 3 doses-will be completing tomorrow, followed by prednisone  50 mg daily-will need prescription for at least 2-week and they are arranging a close outpatient follow-up. - Temporal artery biopsy tomorrow with vascular surgery

## 2024-04-13 NOTE — Progress Notes (Signed)
 NEUROLOGY CONSULT FOLLOW UP NOTE   Date of service: April 13, 2024 Patient Name: Alison [name] MRN:  [id number] DOB:  1976/01/17  Interval Hx/subjective  Seen and examined Headache better   Vitals   Vitals:   04/12/24 1718 04/12/24 2003 04/13/24 0411 04/13/24 0724  BP: 111/72 (!) 140/86 119/81 121/87  Pulse: 79 85 70 77  Resp: 18 18 16 17   Temp: 98.7 F (37.1 C) 98.3 F (36.8 C) 98.2 F (36.8 C) 97.9 F (36.6 C)  TempSrc:  Oral    SpO2: 94% 93% 96% 91%  Weight:      Height:         Body mass index is 39.54 kg/m.  Physical Exam  Awake alert in no distress Normocephalic, atraumatic Clear lungs Regular rate rhythm on chest auscultation Neurological exam Awake alert oriented x 3.  No dysarthria.  No aphasia. Cranial nerves II through XII unremarkable. Motor examination with no strength deficits Sensation intact Coordination examination reveals no dysmetria Palpable temporal pulses Not much of palpable tenderness on the temple   Medications  Current Facility-Administered Medications:    heparin  injection 5,000 Units, 5,000 Units, Subcutaneous, Q8H, Elgergawy, Dawood S, MD, 5,000 Units at 04/13/24 0507   HYDROmorphone  (DILAUDID ) injection 0.5 mg, 0.5 mg, Intravenous, Q4H PRN, Elgergawy, Dawood S, MD, 0.5 mg at 04/12/24 1441   insulin  aspart (novoLOG ) injection 0-20 Units, 0-20 Units, Subcutaneous, TID WC, Elgergawy, Dawood S, MD, 20 Units at 04/13/24 9164   insulin  aspart (novoLOG ) injection 0-5 Units, 0-5 Units, Subcutaneous, QHS, Elgergawy, Dawood S, MD, 5 Units at 04/12/24 2211   insulin  aspart (novoLOG ) injection 10 Units, 10 Units, Subcutaneous, TID WC, Amin, Sumayya, MD   insulin  glargine-yfgn (SEMGLEE ) injection 30 Units, 30 Units, Subcutaneous, BID, Amin, Sumayya, MD   lurasidone  (LATUDA ) tablet 20 mg, 20 mg, Oral, Daily, Elgergawy, Dawood S, MD, 20 mg at 04/13/24 0830   magnesium  oxide (MAG-OX) tablet 800 mg, 800 mg, Oral, BID, Elgergawy, Dawood S, MD, 800  mg at 04/13/24 0830   melatonin tablet 10 mg, 10 mg, Oral, QHS, Elgergawy, Dawood S, MD, 10 mg at 04/12/24 2211   methylPREDNISolone  sodium succinate (SOLU-MEDROL ) 1,000 mg in sodium chloride  0.9 % 50 mL IVPB, 1,000 mg, Intravenous, Q24H, Jamilla Galli, MD, Stopped at 04/12/24 2321   metoCLOPramide  (REGLAN ) injection 5 mg, 5 mg, Intravenous, Q8H PRN, Elgergawy, Brayton RAMAN, MD, 5 mg at 04/12/24 1833   naloxone  (NARCAN ) nasal spray 4 mg/0.1 mL, 0.4 mg, Nasal, Once, Elgergawy, Brayton RAMAN, MD   oxyCODONE  (Oxy IR/ROXICODONE ) immediate release tablet 5 mg, 5 mg, Oral, Q6H PRN, Elgergawy, Brayton RAMAN, MD   pantoprazole  (PROTONIX ) EC tablet 40 mg, 40 mg, Oral, Daily, Elgergawy, Dawood S, MD, 40 mg at 04/13/24 9170   pregabalin  (LYRICA ) capsule 200 mg, 200 mg, Oral, BID, Elgergawy, Dawood S, MD, 200 mg at 04/13/24 0830   rosuvastatin  (CRESTOR ) tablet 10 mg, 10 mg, Oral, Daily, Elgergawy, Dawood S, MD, 10 mg at 04/13/24 9170   tacrolimus  (PROGRAF ) capsule 1 mg, 1 mg, Oral, q morning, Elgergawy, Dawood S, MD, 1 mg at 04/13/24 0830   venlafaxine  XR (EFFEXOR -XR) 24 hr capsule 37.5 mg, 37.5 mg, Oral, q morning, Elgergawy, Dawood S, MD, 37.5 mg at 04/13/24 9167   venlafaxine  XR (EFFEXOR -XR) 24 hr capsule 75 mg, 75 mg, Oral, Q breakfast, Elgergawy, Brayton RAMAN, MD, 75 mg at 04/13/24 0833  Labs and Diagnostic Imaging   CBC:  Recent Labs  Lab 04/12/24 0500 04/13/24 0406  WBC 8.0 9.3  HGB 11.9* 11.7*  HCT 36.3 35.1*  MCV 86.8 84.6  PLT 163 185    Basic Metabolic Panel:  Lab Results  Component Value Date   NA 132 (L) 04/13/2024   K 4.5 04/13/2024   CO2 23 04/13/2024   GLUCOSE 337 (H) 04/13/2024   BUN 27 (H) 04/13/2024   CREATININE 1.12 (H) 04/13/2024   CALCIUM  9.3 04/13/2024   GFRNONAA 60 (L) 04/13/2024   GFRAA 74 12/11/2020   Lipid Panel:  Lab Results  Component Value Date   LDLCALC 57 04/01/2024   HgbA1c:  Lab Results  Component Value Date   HGBA1C 9.7 (H) 04/01/2024   Urine Drug Screen: No  results found for: LABOPIA, COCAINSCRNUR, LABBENZ, AMPHETMU, THCU, LABBARB  Alcohol Level No results found for: [facility] INR  Lab Results  Component Value Date   INR 1.0 03/15/2024   APTT  Lab Results  Component Value Date   APTT 59 (H) 10/15/2023   CSF: Glucose 145, total protein 60, RBC 29 and 13, WBC 5 and 3.  Imaging personally reviewed Noncontrasted MRI of the brain and the orbits with concern for edema around the intraconal fat in the right orbital apex. I recommended MRI orbits with and without contrast be performed which was completed this morning. MRI of the orbits with and without contrast redemonstrated edema along the intraorbital right optic nerve with mild enhancement along the optic nerve sheath and within the adjacent intraconal fat suggestive of optic perineuritis.  No evidence of enhancement within the substance of the optic nerve  Assessment   Alison [name] is a 48 y.o. female past history of obesity, bilateral parotid lymphoma status post Rituxan  in 2020, diffuse B-cell lymphoma, SLE, status post liver transplant currently on tacrolimus , neuropathy, MDD, sleep apnea, fibromyalgia presented for evaluation of headache-predominantly behind her right eye and pain with right eye movement.  Imaging in the form of MRI brain and orbits with and without contrast is concerning for right optic perineuritis.  Other differentials include giant cell arteritis-although ESR is chronically elevated, CRP is normal and there is no thrombocytosis, no jaw claudication.  That said, given her immune suppressed status, she may not mount responses that are captured by laboratory findings and she is scheduled for a temporal artery biopsy on Wednesday. Spinal tap was performed-CSF unremarkable for infectious process.  Glucose was elevated, congruent to serum glucose.  Protein was mildly elevated at 60-may be due to the autoimmune process versus secondary to her diabetes.  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  Follow-up with ophthalmology in 2 weeks Continue IV Solu-Medrol  for a total of 3 doses IV. After that start prednisone  50 mg daily. Temporal artery biopsy on Wednesday-appreciate vascular surgery consultation Have sent a message to [facility] clinic neurology where she is an established patient-they will follow after the temporal artery biopsy in the next few weeks. Please call back with questions. Plan relayed to Dr. Caleen ______________________________________________________________________   Signed, Eligio Lav, MD [facility]

## 2024-04-13 NOTE — H&P (View-Only) (Signed)
 Progress Note    04/13/2024 10:01 AM * No surgery found *  Subjective:  Alison [name] is a 48 y.o. female with previous medical history of parotid lymphoma, diffuse large B-cell lymphoma, fibromyalgia, major depressive disorder, neuropathy, SLE status post liver transplant currently on tacrolimus  who presented to [facility] for evaluation of a headache for approximately 10 days. Patient endorses the headache remains the same. No complaints overnight. Vitals all remain stable.    Vitals:   04/13/24 0411 04/13/24 0724  BP: 119/81 121/87  Pulse: 70 77  Resp: 16 17  Temp: 98.2 F (36.8 C) 97.9 F (36.6 C)  SpO2: 96% 91%   Physical Exam: Cardiac:  RRR, normal S1 and S2.  No rubs clicks gallops or murmurs noted. Lungs: Clear throughout on auscultation, nonlabored breathing.  No rales rhonchi or wheezing noted. Incisions: None Extremities: All extremities with palpable pulses and warm to touch. Abdomen: Positive bowel sounds throughout, soft, nontender and nondistended. Neurologic: Alert and oriented x 3, answers all questions and follows commands appropriately.  Sensation is grossly intact in all extremities.  Strength is symmetrical.  Speech is fluent.  Patient remains with headache.  CBC    Component Value Date/Time   WBC 9.3 04/13/2024 0406   RBC 4.15 04/13/2024 0406   HGB 11.7 (L) 04/13/2024 0406   HGB 11.9 (L) 03/03/2024 0945   HGB 14.6 10/16/2021 1122   HCT 35.1 (L) 04/13/2024 0406   HCT 44.0 10/16/2021 1122   PLT 185 04/13/2024 0406   PLT 223 03/03/2024 0945   MCV 84.6 04/13/2024 0406   MCV 88 10/16/2021 1122   MCH 28.2 04/13/2024 0406   MCHC 33.3 04/13/2024 0406   RDW 14.5 04/13/2024 0406   RDW 13.7 10/16/2021 1122   LYMPHSABS 1.8 03/14/2024 2145   LYMPHSABS 2.0 10/16/2021 1122   MONOABS 0.7 03/14/2024 2145   EOSABS 72 04/01/2024 1043   EOSABS 0.4 10/16/2021 1122   BASOSABS 54 04/01/2024 1043   BASOSABS 0.1 10/16/2021 1122    BMET     Component Value Date/Time   NA 132 (L) 04/13/2024 0406   K 4.5 04/13/2024 0406   CL 95 (L) 04/13/2024 0406   CO2 23 04/13/2024 0406   GLUCOSE 337 (H) 04/13/2024 0406   BUN 27 (H) 04/13/2024 0406   CREATININE 1.12 (H) 04/13/2024 0406   CREATININE 1.14 (H) 04/01/2024 1043   CALCIUM  9.3 04/13/2024 0406   CALCIUM  7.6 (L) 06/27/2022 1448   GFRNONAA 60 (L) 04/13/2024 0406   GFRNONAA >60 03/03/2024 0958   GFRNONAA 64 12/11/2020 0000   GFRAA 74 12/11/2020 0000    INR    Component Value Date/Time   INR 1.0 03/15/2024 0619     Intake/Output Summary (Last 24 hours) at 04/13/2024 1001 Last data filed at 04/13/2024 0348 Gross per 24 hour  Intake 65.79 ml  Output --  Net 65.79 ml     Assessment/Plan:  48 y.o. female who presents to [facility] emergency department with chief complaint of headache x 10 days.* No surgery found *   PLAN Based upon the patient's presentation as well as imaging, giant cell arteritis is a potential cause of her ongoing headache.  Based on this the only way to truly establish diagnosis would be with temporal artery biopsy.  I have discussed the temporal artery biopsy with the patient including the risk, benefits and alternatives and she is agreeable to proceed with temporal artery biopsy at this time.  Will plan on temporal artery  biopsy tomorrow on Wednesday 04/14/2024.    Plan of care discussed with Dr.Dew and he is in agreement with plan noted above.   DVT prophylaxis:  Heparin  5000 units SQ    Alison [name] [facility] 04/13/2024 10:01 AM

## 2024-04-13 NOTE — Progress Notes (Signed)
 Progress Note    04/13/2024 10:01 AM * No surgery found *  Subjective:  Alison [name] is a 48 y.o. female with previous medical history of parotid lymphoma, diffuse large B-cell lymphoma, fibromyalgia, major depressive disorder, neuropathy, SLE status post liver transplant currently on tacrolimus  who presented to [facility] for evaluation of a headache for approximately 10 days. Patient endorses the headache remains the same. No complaints overnight. Vitals all remain stable.    Vitals:   04/13/24 0411 04/13/24 0724  BP: 119/81 121/87  Pulse: 70 77  Resp: 16 17  Temp: 98.2 F (36.8 C) 97.9 F (36.6 C)  SpO2: 96% 91%   Physical Exam: Cardiac:  RRR, normal S1 and S2.  No rubs clicks gallops or murmurs noted. Lungs: Clear throughout on auscultation, nonlabored breathing.  No rales rhonchi or wheezing noted. Incisions: None Extremities: All extremities with palpable pulses and warm to touch. Abdomen: Positive bowel sounds throughout, soft, nontender and nondistended. Neurologic: Alert and oriented x 3, answers all questions and follows commands appropriately.  Sensation is grossly intact in all extremities.  Strength is symmetrical.  Speech is fluent.  Patient remains with headache.  CBC    Component Value Date/Time   WBC 9.3 04/13/2024 0406   RBC 4.15 04/13/2024 0406   HGB 11.7 (L) 04/13/2024 0406   HGB 11.9 (L) 03/03/2024 0945   HGB 14.6 10/16/2021 1122   HCT 35.1 (L) 04/13/2024 0406   HCT 44.0 10/16/2021 1122   PLT 185 04/13/2024 0406   PLT 223 03/03/2024 0945   MCV 84.6 04/13/2024 0406   MCV 88 10/16/2021 1122   MCH 28.2 04/13/2024 0406   MCHC 33.3 04/13/2024 0406   RDW 14.5 04/13/2024 0406   RDW 13.7 10/16/2021 1122   LYMPHSABS 1.8 03/14/2024 2145   LYMPHSABS 2.0 10/16/2021 1122   MONOABS 0.7 03/14/2024 2145   EOSABS 72 04/01/2024 1043   EOSABS 0.4 10/16/2021 1122   BASOSABS 54 04/01/2024 1043   BASOSABS 0.1 10/16/2021 1122    BMET     Component Value Date/Time   NA 132 (L) 04/13/2024 0406   K 4.5 04/13/2024 0406   CL 95 (L) 04/13/2024 0406   CO2 23 04/13/2024 0406   GLUCOSE 337 (H) 04/13/2024 0406   BUN 27 (H) 04/13/2024 0406   CREATININE 1.12 (H) 04/13/2024 0406   CREATININE 1.14 (H) 04/01/2024 1043   CALCIUM  9.3 04/13/2024 0406   CALCIUM  7.6 (L) 06/27/2022 1448   GFRNONAA 60 (L) 04/13/2024 0406   GFRNONAA >60 03/03/2024 0958   GFRNONAA 64 12/11/2020 0000   GFRAA 74 12/11/2020 0000    INR    Component Value Date/Time   INR 1.0 03/15/2024 0619     Intake/Output Summary (Last 24 hours) at 04/13/2024 1001 Last data filed at 04/13/2024 0348 Gross per 24 hour  Intake 65.79 ml  Output --  Net 65.79 ml     Assessment/Plan:  48 y.o. female who presents to [facility] emergency department with chief complaint of headache x 10 days.* No surgery found *   PLAN Based upon the patient's presentation as well as imaging, giant cell arteritis is a potential cause of her ongoing headache.  Based on this the only way to truly establish diagnosis would be with temporal artery biopsy.  I have discussed the temporal artery biopsy with the patient including the risk, benefits and alternatives and she is agreeable to proceed with temporal artery biopsy at this time.  Will plan on temporal artery  biopsy tomorrow on Wednesday 04/14/2024.    Plan of care discussed with Dr.Dew and he is in agreement with plan noted above.   DVT prophylaxis:  Heparin  5000 units SQ    Gerrit Rafalski R Stefan Markarian [facility] 04/13/2024 10:01 AM

## 2024-04-13 NOTE — Assessment & Plan Note (Signed)
 CBG elevated likely secondary to steroid use. - Continue with Semglee  -Increasing mealtime coverage to 10 unit -Continue with SSI -Needing extra insulin  intermittently

## 2024-04-13 NOTE — Progress Notes (Signed)
 Progress Note   [name] FMW:[id number] DOB: 09/16/1976 DOA: 04/11/2024     2 DOS: the patient was seen and examined on 04/13/2024   Brief hospital course: Taken from H&P.  Alison [name]  is a 48 y.o. female, with medical history significant for morbid obesity, lymphoma bilateral parotid gland s/p Rituxan  in 2020, diffuse large B-cell lymphoma,  lupus s/p liver transplant, currently on tacrolimus , neuropathy, major depressive disorder, sleep apnea presents to ED secondary to complaints of right eye pain, and headache for about 10 days.  Denies any change in vision, fever or chills.  ED provider discussed with ophthalmology, they requested MRI of brain and orbit due to concern of autoimmune process, imaging without contrast with concern of temporal arteritis versus optic perineuritis.  Neurology and vascular surgery was consulted and patient was started on Solu-Medrol .  7/7: Vital stable, labs with elevated blood glucose level, likely secondary to steroid, CRP was normal at 0.6, and mildly elevated ESR which is better than his baseline with history of chronic illnesses is not compatible with temporal arteritis.  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  No evidence of enhancement within the optic nerve.  Pending ophthalmology evaluation. Neurology also ordered lumbar puncture. Continuing steroids for now Temporal artery biopsy is planned for Wednesday with vascular surgery.  7/8: Symptoms improving, CSF culture with no organism, ophthalmology signed off as there was no obvious abnormality. Likely autoimmune with her history of lupus. Neurology is suggesting 3 doses of high-dose steroid followed by prednisone  50 mg daily-need discharge orders for at least 2 weeks and outpatient neurology will follow-up and taper accordingly. CBG remained elevated secondary to high-dose steroid requiring extra insulin .  Assessment and  Plan: * Eye pain Optic perineuritis/giant cell arteritis. Imaging concerning for optic perineuritis, no involvement of haptic nerve.  No change in vision.  Inflammatory markers with mildly elevated ESR and normal CRP are not very consistent with temporal arteritis but in order to completely rule it out vascular surgery was consulted and they are planning temporal artery biopsy on Wednesday.  Patient has history of significant autoimmune disorders. S/p LP, preliminary CSF cultures negative Ophthalmology signed off as there is no ophthalmology reason, likely autoimmune Neurology signed off today with following recommendations - Continue with high-dose steroid for total of 3 doses-will be completing tomorrow, followed by prednisone  50 mg daily-will need prescription for at least 2-week and they are arranging a close outpatient follow-up. - Temporal artery biopsy tomorrow with vascular surgery   Diabetes mellitus without complication (HCC) CBG elevated likely secondary to steroid use. - Continue with Semglee  -Increasing mealtime coverage to 10 unit -Continue with SSI -Needing extra insulin  intermittently  S/P liver transplant (HCC) - Continue home tacrolimus   Chronic pain - Continuing home medications  Bipolar disorder (manic depression) (HCC) - Continue home psych meds  Morbid obesity (HCC) Estimated body mass index is 39.54 kg/m as calculated from the following:   Height as of this encounter: 5' 6 (1.676 m).   Weight as of this encounter: 111.1 kg.   - This will complicate overall prognosis -Encouraged weight loss  Hypertension Blood pressure currently within goal. Currently home lisinopril  has been held by our patient provider for about a month. - Continue to monitor -Will restart lisinopril  if needed  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HCC) - Outpatient follow-up  OSA on CPAP - CPAP at night   Subjective: Patient was seen and examined today.  Pain behind her eye  seems  improved.  No new concern.  No change in vision.  Physical Exam: Vitals:   04/12/24 1718 04/12/24 2003 04/13/24 0411 04/13/24 0724  BP: 111/72 (!) 140/86 119/81 121/87  Pulse: 79 85 70 77  Resp: 18 18 16 17   Temp: 98.7 F (37.1 C) 98.3 F (36.8 C) 98.2 F (36.8 C) 97.9 F (36.6 C)  TempSrc:  Oral    SpO2: 94% 93% 96% 91%  Weight:      Height:       General.  Morbidly obese lady, in no acute distress. Pulmonary.  Lungs clear bilaterally, normal respiratory effort. CV.  Regular rate and rhythm, no JVD, rub or murmur. Abdomen.  Soft, nontender, nondistended, BS positive. CNS.  Alert and oriented .  No focal neurologic deficit. Extremities.  No edema, no cyanosis, pulses intact and symmetrical. Psychiatry.  Judgment and insight appears normal.   Data Reviewed: Prior data reviewed  Family Communication: Discussed with patient  Disposition: Status is: Inpatient Remains inpatient appropriate because: Severity of illness  Planned Discharge Destination: Home  DVT prophylaxis.  Subcu heparin  Time spent: 50 minutes  This record has been created using Conservation officer, historic buildings. Errors have been sought and corrected,but may not always be located. Such creation errors do not reflect on the standard of care.   Author: Amaryllis Dare, MD 04/13/2024 2:17 PM  For on call review [email or web address].

## 2024-04-14 ENCOUNTER — Inpatient Hospital Stay: Payer: Self-pay | Admitting: Anesthesiology

## 2024-04-14 ENCOUNTER — Encounter

## 2024-04-14 ENCOUNTER — Encounter
Admission: EM | Disposition: A | Discharge status: Home / Self Care | Payer: Self-pay | Source: Home / Self Care | Attending: Internal Medicine

## 2024-04-14 ENCOUNTER — Other Ambulatory Visit: Payer: Self-pay

## 2024-04-14 ENCOUNTER — Encounter: Payer: Self-pay | Admitting: Internal Medicine

## 2024-04-14 ENCOUNTER — Ambulatory Visit: Admitting: Physical Therapy

## 2024-04-14 DIAGNOSIS — H468 Other optic neuritis: Secondary | ICD-10-CM | POA: Diagnosis not present

## 2024-04-14 DIAGNOSIS — R519 Headache, unspecified: Secondary | ICD-10-CM | POA: Diagnosis not present

## 2024-04-14 DIAGNOSIS — H5711 Ocular pain, right eye: Secondary | ICD-10-CM | POA: Diagnosis not present

## 2024-04-14 DIAGNOSIS — E119 Type 2 diabetes mellitus without complications: Secondary | ICD-10-CM | POA: Diagnosis not present

## 2024-04-14 DIAGNOSIS — I4510 Unspecified right bundle-branch block: Secondary | ICD-10-CM | POA: Diagnosis not present

## 2024-04-14 DIAGNOSIS — G588 Other specified mononeuropathies: Secondary | ICD-10-CM | POA: Diagnosis not present

## 2024-04-14 HISTORY — PX: ARTERY BIOPSY: SHX891

## 2024-04-14 LAB — COMP PANEL: LEUKEMIA/LYMPHOMA

## 2024-04-14 LAB — GLUCOSE, CAPILLARY
Glucose-Capillary: 288 mg/dL — ABNORMAL HIGH (ref 70–99)
Glucose-Capillary: 305 mg/dL — ABNORMAL HIGH (ref 70–99)
Glucose-Capillary: 340 mg/dL — ABNORMAL HIGH (ref 70–99)
Glucose-Capillary: 330 mg/dL — ABNORMAL HIGH (ref 70–99)
Glucose-Capillary: 377 mg/dL — ABNORMAL HIGH (ref 70–99)
Glucose-Capillary: 468 mg/dL — ABNORMAL HIGH (ref 70–99)
Glucose-Capillary: 451 mg/dL — ABNORMAL HIGH (ref 70–99)
Glucose-Capillary: 434 mg/dL — ABNORMAL HIGH (ref 70–99)

## 2024-04-14 LAB — GLUCOSE, RANDOM: Glucose, Bld: 436 mg/dL — ABNORMAL HIGH (ref 70–99)

## 2024-04-14 SURGERY — BIOPSY TEMPORAL ARTERY
Anesthesia: General | Laterality: Right

## 2024-04-14 MED ORDER — DEXAMETHASONE SODIUM PHOSPHATE 10 MG/ML IJ SOLN
INTRAMUSCULAR | Status: DC | PRN
  Administered 2024-04-14: 5 mg via INTRAVENOUS

## 2024-04-14 MED ORDER — CLINDAMYCIN PHOSPHATE 900 MG/50ML IV SOLN
INTRAVENOUS | Status: AC
  Filled 2024-04-14: qty 50

## 2024-04-14 MED ORDER — LIDOCAINE HCL (CARDIAC) PF 100 MG/5ML IV SOSY
PREFILLED_SYRINGE | INTRAVENOUS | Status: DC | PRN
  Administered 2024-04-14: 60 mg via INTRAVENOUS

## 2024-04-14 MED ORDER — MIDAZOLAM HCL 2 MG/2ML IJ SOLN
INTRAMUSCULAR | Status: AC
  Filled 2024-04-14: qty 2

## 2024-04-14 MED ORDER — PROPOFOL 1000 MG/100ML IV EMUL
INTRAVENOUS | Status: AC
  Filled 2024-04-14: qty 100

## 2024-04-14 MED ORDER — PROPOFOL 10 MG/ML IV BOLUS
INTRAVENOUS | Status: AC
Start: 2024-04-14 — End: 2024-04-14
  Filled 2024-04-14: qty 20

## 2024-04-14 MED ORDER — INSULIN GLARGINE-YFGN 100 UNIT/ML ~~LOC~~ SOLN
35.0000 [IU] | Freq: Two times a day (BID) | SUBCUTANEOUS | Status: DC
  Administered 2024-04-14 (×2): 35 [IU] via SUBCUTANEOUS
  Filled 2024-04-14 (×4): qty 0.35

## 2024-04-14 MED ORDER — LIDOCAINE HCL (PF) 2 % IJ SOLN
INTRAMUSCULAR | Status: AC
  Filled 2024-04-14: qty 5

## 2024-04-14 MED ORDER — LIDOCAINE HCL (PF) 1 % IJ SOLN
INTRAMUSCULAR | Status: AC
  Filled 2024-04-14: qty 30

## 2024-04-14 MED ORDER — SODIUM CHLORIDE 0.9 % IV SOLN: INTRAVENOUS | Status: DC | PRN

## 2024-04-14 MED ORDER — FENTANYL CITRATE (PF) 100 MCG/2ML IJ SOLN: 25.0000 ug | INTRAMUSCULAR | Status: DC | PRN

## 2024-04-14 MED ORDER — DEXAMETHASONE SODIUM PHOSPHATE 10 MG/ML IJ SOLN
INTRAMUSCULAR | Status: AC
  Filled 2024-04-14: qty 1

## 2024-04-14 MED ORDER — FENTANYL CITRATE (PF) 100 MCG/2ML IJ SOLN
INTRAMUSCULAR | Status: DC | PRN
  Administered 2024-04-14 (×2): 25 ug via INTRAVENOUS

## 2024-04-14 MED ORDER — MIDAZOLAM HCL 2 MG/2ML IJ SOLN
INTRAMUSCULAR | Status: DC | PRN
  Administered 2024-04-14: 2 mg via INTRAVENOUS

## 2024-04-14 MED ORDER — CLINDAMYCIN PHOSPHATE 900 MG/50ML IV SOLN
900.0000 mg | INTRAVENOUS | Status: AC
  Administered 2024-04-14: 900 mg via INTRAVENOUS

## 2024-04-14 MED ORDER — INSULIN ASPART 100 UNIT/ML IJ SOLN
20.0000 [IU] | Freq: Once | INTRAMUSCULAR | Status: AC
  Administered 2024-04-14: 20 [IU] via SUBCUTANEOUS
  Filled 2024-04-14: qty 1

## 2024-04-14 MED ORDER — PROPOFOL 500 MG/50ML IV EMUL
INTRAVENOUS | Status: DC | PRN
  Administered 2024-04-14: 100 ug/kg/min via INTRAVENOUS
  Administered 2024-04-14: 40 mg via INTRAVENOUS

## 2024-04-14 MED ORDER — 0.9 % SODIUM CHLORIDE (POUR BTL) OPTIME
TOPICAL | Status: DC | PRN
  Administered 2024-04-14: 500 mL

## 2024-04-14 MED ORDER — ONDANSETRON HCL 4 MG/2ML IJ SOLN
INTRAMUSCULAR | Status: AC
  Filled 2024-04-14: qty 2

## 2024-04-14 MED ORDER — LIDOCAINE HCL (PF) 1 % IJ SOLN
INTRAMUSCULAR | Status: DC | PRN
  Administered 2024-04-14: 10 mL

## 2024-04-14 MED ORDER — FENTANYL CITRATE (PF) 100 MCG/2ML IJ SOLN
INTRAMUSCULAR | Status: AC
Start: 2024-04-14 — End: 2024-04-14
  Filled 2024-04-14: qty 2

## 2024-04-14 MED ORDER — ONDANSETRON HCL 4 MG/2ML IJ SOLN
INTRAMUSCULAR | Status: DC | PRN
  Administered 2024-04-14: 4 mg via INTRAVENOUS

## 2024-04-14 MED ORDER — INSULIN ASPART 100 UNIT/ML IJ SOLN
20.0000 [IU] | Freq: Three times a day (TID) | INTRAMUSCULAR | Status: DC
  Administered 2024-04-14 (×2): 20 [IU] via SUBCUTANEOUS
  Filled 2024-04-14 (×2): qty 1

## 2024-04-14 SURGICAL SUPPLY — 30 items
BLADE SURG 15 STRL LF DISP TIS (BLADE) ×1 IMPLANT
BLADE SURG SZ11 CARB STEEL (BLADE) ×1 IMPLANT
COTTON BALL STERILE 4 PK (GAUZE/BANDAGES/DRESSINGS) ×1 IMPLANT
DERMABOND ADVANCED .7 DNX12 (GAUZE/BANDAGES/DRESSINGS) ×1 IMPLANT
DRAPE LAPAROTOMY 77X122 PED (DRAPES) ×1 IMPLANT
DRAPE SHEET LG 3/4 BI-LAMINATE (DRAPES) IMPLANT
DRSG TELFA 3X4 N-ADH STERILE (GAUZE/BANDAGES/DRESSINGS) ×1 IMPLANT
ELECT CAUTERY BLADE 6.4 (BLADE) ×1 IMPLANT
ELECTRODE REM PT RTRN 9FT ADLT (ELECTROSURGICAL) ×1 IMPLANT
GAUZE 4X4 16PLY ~~LOC~~+RFID DBL (SPONGE) IMPLANT
GLOVE BIO SURGEON STRL SZ7 (GLOVE) ×2 IMPLANT
GOWN STRL REUS W/ TWL LRG LVL3 (GOWN DISPOSABLE) ×2 IMPLANT
GOWN STRL REUS W/TWL 2XL LVL3 (GOWN DISPOSABLE) ×1 IMPLANT
KIT TURNOVER KIT A (KITS) ×1 IMPLANT
LABEL OR SOLS (LABEL) ×1 IMPLANT
MANIFOLD NEPTUNE II (INSTRUMENTS) ×1 IMPLANT
NDL HYPO 25X1 1.5 SAFETY (NEEDLE) IMPLANT
NEEDLE HYPO 25X1 1.5 SAFETY (NEEDLE) IMPLANT
NS IRRIG 500ML POUR BTL (IV SOLUTION) ×1 IMPLANT
PACK BASIN MINOR ARMC (MISCELLANEOUS) ×1 IMPLANT
SOLUTION PREP PVP 2OZ (MISCELLANEOUS) ×1 IMPLANT
SUCTION TUBE FRAZIER 10FR DISP (SUCTIONS) ×1 IMPLANT
SUT MNCRL AB 4-0 PS2 18 (SUTURE) ×1 IMPLANT
SUT SILK 2-0 18XBRD TIE 12 (SUTURE) ×1 IMPLANT
SUT SILK 3-0 18XBRD TIE 12 (SUTURE) ×1 IMPLANT
SUT SILK 4-0 18XBRD TIE 12 (SUTURE) ×1 IMPLANT
SUT VIC AB 3-0 SH 27X BRD (SUTURE) ×1 IMPLANT
SYR 10ML LL (SYRINGE) IMPLANT
TRAP FLUID SMOKE EVACUATOR (MISCELLANEOUS) ×1 IMPLANT
WATER STERILE IRR 500ML POUR (IV SOLUTION) ×1 IMPLANT

## 2024-04-14 NOTE — Transfer of Care (Signed)
 Immediate Anesthesia Transfer of Care Note  [name]  Procedure(s) Performed: BIOPSY TEMPORAL ARTERY (Right)  Patient Location: PACU  Anesthesia Type:MAC  Level of Consciousness: drowsy and patient cooperative  Airway & Oxygen Therapy: Patient Spontanous Breathing and Patient connected to face mask oxygen  Post-op Assessment: Report given to RN and Post -op Vital signs reviewed and stable  Post vital signs: Reviewed and stable  Last Vitals:  Vitals Value Taken Time  BP 111/67 04/14/24 08:43  Temp 36.8 C 04/14/24 08:43  Pulse 62 04/14/24 08:45  Resp 22 04/14/24 08:45  SpO2 98 % 04/14/24 08:45    Last Pain:  Vitals:   04/14/24 0843  TempSrc:   PainSc: Asleep         Complications: No notable events documented.

## 2024-04-14 NOTE — Interval H&P Note (Signed)
 History and Physical Interval Note:  04/14/2024 7:26 AM  Alison [name]  has presented today for surgery, with the diagnosis of Optic perineuritis.  The various methods of treatment have been discussed with the patient and family. After consideration of risks, benefits and other options for treatment, the patient has consented to  Procedure(s): BIOPSY TEMPORAL ARTERY (Right) as a surgical intervention.  The patient's history has been reviewed, patient examined, no change in status, stable for surgery.  I have reviewed the patient's chart and labs.  Questions were answered to the patient's satisfaction.     Milo Schreier

## 2024-04-14 NOTE — Op Note (Signed)
        OPERATIVE NOTE   PRE-OPERATIVE DIAGNOSIS: suspected temporal arteritis, headaches  POST-OPERATIVE DIAGNOSIS: Same as above  PROCEDURE: 1.   Right temporal artery biopsy  SURGEON: Selinda Gu, MD  ASSISTANT(S): none  ANESTHESIA: MAC  ESTIMATED BLOOD LOSS: Minimal  FINDING(S): 1.  none  SPECIMEN(S):  Right  superficial temporal artery sent to pathology  INDICATIONS:   Patient is a 48 y.o. female who presents with headaches and concern for temporal arteritis. We were consulted by the hospitalist for consideration for temporal artery biopsy. Risks and benefits were discussed and he was agreeable to proceed.  DESCRIPTION: After obtaining full informed written consent, the patient was brought back to the operating room and placed supine upon the operating table.  The patient received IV antibiotics prior to induction.  After obtaining adequate anesthesia, the patient was prepped and draped in the standard fashion. The area in front of his right ear was anesthetized copiously with a solution of 1% lidocaine  and half percent Marcaine without epinephrine . I then made an incision just in front of the right ear overlying the palpable pulse. I then dissected down through the subcutaneous tissues and identified the superficial temporal artery. This was dissected out over a several centimeters and branches were ligated and divided between silk ties. Care was used to avoid electrocautery around the artery. I then clamped the artery proximally and distally and transected the artery. The specimen was then sent to pathology. The proximal and distal artery were ligated with 3-0 silk ties. Hemostasis was achieved. The wound was then closed with a series of interrupted 3-0 Vicryl's and the skin was closed with a 4-0 Monocryl. Sterile dressing was placed. The patient was taken to the recovery room in stable condition having tolerated the procedure well.  COMPLICATIONS: None  CONDITION:  Stable   Selinda Gu 04/14/2024 8:41 AM  This note was created with Dragon Medical transcription system. Any errors in dictation are purely unintentional.

## 2024-04-14 NOTE — Plan of Care (Addendum)
 Pt alert and oriented, denies any c/o pain. Pt has S/P Temporal Artery Biopsy with sutures and dermabond glue. Blood glucose levels elevated today due to prior IV Solumedrol doses, sliding scale and meal coverage increased. Pt resting in bed, and tolerating food.  Problem: Education: Goal: Ability to describe self-care measures that may prevent or decrease complications (Diabetes Survival Skills Education) will improve Outcome: Progressing Goal: Individualized Educational Video(s) Outcome: Progressing   Problem: Coping: Goal: Ability to adjust to condition or change in health will improve Outcome: Progressing   Problem: Fluid Volume: Goal: Ability to maintain a balanced intake and output will improve Outcome: Progressing   Problem: Health Behavior/Discharge Planning: Goal: Ability to identify and utilize available resources and services will improve Outcome: Progressing Goal: Ability to manage health-related needs will improve Outcome: Progressing   Problem: Metabolic: Goal: Ability to maintain appropriate glucose levels will improve Outcome: Progressing   Problem: Nutritional: Goal: Maintenance of adequate nutrition will improve Outcome: Progressing Goal: Progress toward achieving an optimal weight will improve Outcome: Progressing   Problem: Skin Integrity: Goal: Risk for impaired skin integrity will decrease Outcome: Progressing   Problem: Tissue Perfusion: Goal: Adequacy of tissue perfusion will improve Outcome: Progressing   Problem: Education: Goal: Knowledge of General Education information will improve Description: Including pain rating scale, medication(s)/side effects and non-pharmacologic comfort measures Outcome: Progressing

## 2024-04-14 NOTE — Plan of Care (Signed)
 Patient status post temporal artery biopsy Needs follow-up with outpatient neurology-Dr. Maree at [facility] in the next couple of weeks.   Blood sugars have been elevated with steroids.  Reached out to hospitalist-management per primary team. Needs discharged on prednisone  50 mg daily.  Based on the results of the biopsy, dose can be de-escalated if biopsy is negative.  The high dose IV steroids +7 to 10 days worth of the high-dose prednisone  with taper afterwards should suffice for the optic perineuritis treatment.   -- Eligio Lav, MD Neurologist [facility]

## 2024-04-14 NOTE — Inpatient Diabetes Management (Signed)
 Inpatient Diabetes Program Recommendations  AACE/ADA: New Consensus Statement on Inpatient Glycemic Control   Target Ranges:  Prepandial:   less than 140 mg/dL      Peak postprandial:   less than 180 mg/dL (1-2 hours)      Critically ill patients:  140 - 180 mg/dL    Latest Reference Range & Units 04/14/24 07:03 04/14/24 08:43  Glucose-Capillary 70 - 99 mg/dL 711 (H) 694 (H)    Latest Reference Range & Units 04/13/24 07:26 04/13/24 11:51 04/13/24 12:55 04/13/24 14:15 04/13/24 16:14 04/13/24 18:01 04/13/24 19:24  Glucose-Capillary 70 - 99 mg/dL 619 (H) 514 (H) 522 (H) 473 (H) 434 (H) 378 (H) 319 (H)   Review of Glycemic Control  Diabetes history: DM2 Outpatient Diabetes medications: Lantus  40 units daily, Metformin  1000 g BID, Novolog  0-30 units TID with meals, Ozempic  1 mg Qweek, Glipizide  XL 10 mg daily Current orders for Inpatient glycemic control: Semglee  30 units BID, Novolog  10 units TID with meals, Novolog  0-20 units TID with  meals, Novolog  0-5 units at bedtime; Prednisone  50 mg daily  Inpatient Diabetes Program Recommendations:    Insulin : If steroids are continued as ordered, please consider increasing Semglee  to 35 units BID and Novolog  meal coverage to 15 units TID with meals.  NOTE: Patient received Solumedrol 1000 mg last on 04/13/24 at 23:09 and now ordered Prednisone  50 mg daily.  Thanks, Earnie Gainer, RN, MSN, CDCES Diabetes Coordinator Inpatient Diabetes Program [phone] (Team Pager from 8am to 5pm)

## 2024-04-14 NOTE — Anesthesia Preprocedure Evaluation (Signed)
 Anesthesia Evaluation  Patient identified by MRN, date of birth, ID band Patient awake    Reviewed: Allergy & Precautions, NPO status , Patient's Chart, lab work & pertinent test results  History of Anesthesia Complications Negative for: history of anesthetic complications  Airway Mallampati: I  TM Distance: >3 FB Neck ROM: Full    Dental  (+) Edentulous Upper, Partial Lower, Dental Advidsory Given   Pulmonary shortness of breath and with exertion, sleep apnea , neg COPD, neg recent URI   Pulmonary exam normal breath sounds clear to auscultation       Cardiovascular Exercise Tolerance: Good hypertension, Pt. on medications (-) angina (-) Past MI and (-) Cardiac Stents Normal cardiovascular exam+ dysrhythmias (RBBB) + Valvular Problems/Murmurs  Rhythm:Regular Rate:Normal     Neuro/Psych  Headaches, neg Seizures PSYCHIATRIC DISORDERS Anxiety Depression Bipolar Disorder    Neuromuscular disease    GI/Hepatic negative GI ROS,GERD  ,,(+) Hepatitis -S/p liver transplant in 1993 S/p liver transplant 30 years ago   Endo/Other  diabetes, Type 2  Class 3 obesity  Renal/GU CRFRenal disease  negative genitourinary   Musculoskeletal  (+) Arthritis ,    Abdominal  (+) + obese  Peds negative pediatric ROS (+)  Hematology negative hematology ROS (+)   Anesthesia Other Findings Past Medical History: No date: Abnormal uterine bleeding No date: Allergy No date: Anxiety No date: Arthritis No date: Bipolar disorder (manic depression) (HCC) No date: Chronic kidney failure No date: Chronic renal disease, stage III (HCC) No date: Diabetes mellitus without complication (HCC) 2015: DLBCL (diffuse large B cell lymphoma) (HCC)     Comment:  Right axillary lymph node resected and chemo tx's. 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 No date: Post herpetic neuralgia No date: PTSD (post-traumatic stress disorder) No date: Renal disorder 1993: S/P liver transplant (HCC) No date: Sleep apnea     Comment:  has not recieved cpap yet-other one was recalled  Past Surgical History: 01/13/2015: BONE MARROW BIOPSY 12/2014: BREAST BIOPSY 2011: BREAST BIOPSY No date: BREAST SURGERY No date: CHOLECYSTECTOMY No date: COLONOSCOPY No date: ESOPHAGOGASTRODUODENOSCOPY 01/09/2021: ESOPHAGOGASTRODUODENOSCOPY (EGD) WITH PROPOFOL ; N/A     Comment:  Procedure: ESOPHAGOGASTRODUODENOSCOPY (EGD) WITH               PROPOFOL ;  Surgeon: Jinny Carmine, MD;  Location: [facility]               ENDOSCOPY;  Service: Endoscopy;  Laterality: N/A; No date: HERNIA REPAIR     Comment:  x4-all from liver transplant 11/13/2021: HYSTEROSCOPY WITH D & C; N/A     Comment:  Procedure: DILATATION AND CURETTAGE /HYSTEROSCOPY;                Surgeon: Victor Claudell SAUNDERS, MD;  Location: [facility] ORS;               Service: Gynecology;  Laterality: N/A; No date: infusaport 12/17/1991: LIVER TRANSPLANT No date: LUMBAR PUNCTURE No  date: PORT A CATH INJECTION ([facility] HX) No date: RENAL BIOPSY 2015: tumor removal     Comment:  cancer right side     Reproductive/Obstetrics negative OB ROS                              Anesthesia Physical Anesthesia Plan  ASA: 3  Anesthesia Plan: General   Post-op Pain Management:    Induction: Intravenous  PONV Risk Score and  Plan: 3 and Propofol  infusion and TIVA  Airway Management Planned: Natural Airway and Nasal Cannula  Additional Equipment:   Intra-op Plan:   Post-operative Plan:   Informed Consent: I have reviewed the patients History and Physical, chart, labs and discussed the procedure including the risks, benefits and alternatives for the proposed anesthesia with the patient or authorized representative who has indicated his/her understanding and acceptance.     Dental Advisory Given  Plan Discussed with: CRNA and Surgeon  Anesthesia Plan Comments:          Anesthesia Quick Evaluation

## 2024-04-14 NOTE — Plan of Care (Signed)

## 2024-04-14 NOTE — Progress Notes (Signed)
 Progress Note   [name] DOB: [birth year] DOA: 04/11/2024     3 DOS: the patient was seen and examined on 04/14/2024   Brief hospital course: Taken from H&P.  Alison [name]  is a 48 y.o. female, with medical history significant for morbid obesity, lymphoma bilateral parotid gland s/p Rituxan  in 2020, diffuse large B-cell lymphoma,  lupus s/p liver transplant, currently on tacrolimus , neuropathy, major depressive disorder, sleep apnea presents to ED secondary to complaints of right eye pain, and headache for about 10 days.  Denies any change in vision, fever or chills.  ED provider discussed with ophthalmology, they requested MRI of brain and orbit due to concern of autoimmune process, imaging without contrast with concern of temporal arteritis versus optic perineuritis.  Neurology and vascular surgery was consulted and patient was started on Solu-Medrol .  7/7: Vital stable, labs with elevated blood glucose level, likely secondary to steroid, CRP was normal at 0.6, and mildly elevated ESR which is better than his baseline with history of chronic illnesses is not compatible with temporal arteritis.  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  No evidence of enhancement within the optic nerve.  Pending ophthalmology evaluation. Neurology also ordered lumbar puncture. Continuing steroids for now Temporal artery biopsy is planned for Wednesday with vascular surgery.  7/8: Symptoms improving, CSF culture with no organism, ophthalmology signed off as there was no obvious abnormality. Likely autoimmune with her history of lupus. Neurology is suggesting 3 doses of high-dose steroid followed by prednisone  50 mg daily-need discharge orders for at least 2 weeks and outpatient neurology will follow-up and taper accordingly. CBG remained elevated secondary to high-dose steroid requiring extra insulin .   I assumed care on  04/14/24.  7/9 - temporal artery biopsy done by vascular surgery this AM.  Insulin  regimen escalated due to severe steroid-induced hyperglycemia.   Assessment and Plan:  * Eye pain - improving since admission Optic perineuritis/giant cell arteritis. Imaging concerning for optic perineuritis, no involvement of optic nerve.  No changes in vision.  Inflammatory markers with mildly elevated ESR and normal CRP are not very consistent with temporal arteritis but in order to completely rule it out vascular surgery was consulted for temporal artery biopsy - done 7/9 AM.   Patient has history of significant autoimmune disorders. S/p LP, preliminary CSF cultures negative --Ophthalmology signed off as there is no ophthalmology reason, likely autoimmune --Neurology signed off with following recommendations - High-dose IV steroid for total 3 doses (complete), followed by prednisone  50 mg daily-will need prescription for at least 2-week and they are arranging a close outpatient follow-up. - Temporal artery biopsy done this AM - follow path results   Diabetes mellitus without complication (HCC) Steroid-induced hyperglycemia CBG elevated likely secondary to steroid use. --Increase Semglee  30 >> 35 units BID --Increasing Novolog  10 >> 20 units TID WC --Continue 0-20 units sliding scale Novolog  --Titrate regimen   S/P liver transplant (HCC) - Continue home tacrolimus   Chronic pain - Continuing home medications  Bipolar disorder (manic depression) (HCC) - Continue home psych meds  Morbid obesity (HCC) Estimated body mass index is 39.54 kg/m as calculated from the following:   Height as of this encounter: 5' 6 (1.676 m).   Weight as of this encounter: 111.1 kg.  - This will complicate overall prognosis -Encouraged weight loss  Hypertension Blood pressure currently within goal. Currently home lisinopril  has been held by our patient provider for about a month. -  Continue to monitor -Will  restart lisinopril  if needed  Marginal zone B-cell lymphoma (HCC) - Outpatient follow-up  OSA on CPAP - CPAP at night      Subjective: Pt seen awake resting in bed after her biopsy this AM. She denies acute complaints.  Eye pain has improved since admission. She denies ever having vision changes.    Physical Exam: Vitals:   04/14/24 0900 04/14/24 0901 04/14/24 0915 04/14/24 1138  BP: 110/68 110/68 119/77 126/79  Pulse: 62 62 60 65  Resp: 20 19 18 16   Temp:   98 F (36.7 C) 98 F (36.7 C)  TempSrc:    Oral  SpO2: 90% 96% 91% 93%  Weight:      Height:       General exam: awake, alert, no acute distress, obese HEENT: right temporal incision with dermabond in place no bleeding or surrounding erythema or swelling, moist mucus membranes, hearing grossly normal  Respiratory system: CTAB, no wheezes, rales or rhonchi, normal respiratory effort. Cardiovascular system: normal S1/S2, RRR Gastrointestinal system: soft, NT, ND Central nervous system: A&O x3. no gross focal neurologic deficits, normal speech Extremities: moves all, no edema, normal tone Skin: dry, intact, normal temperature Psychiatry: normal mood, congruent affect, judgement and insight appear normal   Data Reviewed:  CBG's -- 288 >> 305 >> 340 >> 330  Family Communication: None present. Will attempt to call as time allows.  Disposition: Status is: Inpatient Remains inpatient appropriate because: ongoing evaluation and uncontrolled severe hyperglycemia requires improvement before d/c to prevent readmission for DKA or HHS.   Planned Discharge Destination: Home    Time spent: 42 minutes  Author: Burnard DELENA Cunning, DO 04/14/2024 3:53 PM  For on call review [email or web address].

## 2024-04-15 ENCOUNTER — Encounter: Payer: Self-pay | Admitting: Vascular Surgery

## 2024-04-15 ENCOUNTER — Ambulatory Visit

## 2024-04-15 ENCOUNTER — Ambulatory Visit: Payer: Self-pay | Admitting: Ophthalmology

## 2024-04-15 DIAGNOSIS — E119 Type 2 diabetes mellitus without complications: Secondary | ICD-10-CM | POA: Diagnosis not present

## 2024-04-15 DIAGNOSIS — R739 Hyperglycemia, unspecified: Secondary | ICD-10-CM | POA: Diagnosis not present

## 2024-04-15 DIAGNOSIS — M329 Systemic lupus erythematosus, unspecified: Secondary | ICD-10-CM | POA: Diagnosis not present

## 2024-04-15 DIAGNOSIS — H5711 Ocular pain, right eye: Secondary | ICD-10-CM | POA: Diagnosis not present

## 2024-04-15 DIAGNOSIS — H469 Unspecified optic neuritis: Secondary | ICD-10-CM | POA: Diagnosis present

## 2024-04-15 LAB — ANALYZER(R)ANA IFA WITH REFLEX TITER/PATTRN,SYS AUTOIMM PNL1
Anticardiolipin IgG: 25.5 [GPL'U]/mL — AB
Anticardiolipin IgM: 42.6 [MPL'U]/mL — AB
Beta-2 Glyco 1 IgA: 42.6 U/mL — ABNORMAL HIGH
Beta-2 Glyco I IgG: 27.9 U/mL — ABNORMAL HIGH
Beta-2 Glyco I IgG: 36.7 U/mL — ABNORMAL HIGH
C4 Complement: 119 mg/dL — AB (ref 83–57)
C4 Complement: 25.5 mg/dL — ABNORMAL LOW (ref 15–57)
Centromere Ab Screen: <119 mg/dL (ref 83–193)
DNA Ab (DS) Crithidia, IFA: 4.1 AI — AB
DNA Ab (DS) Crithidia, IFA: POSITIVE — AB
ENA SM Ab Ser-aCnc: <4.1 AI — AB
Jo-1 Autoabs: <1 AI
MUTATED CITRULLINATED VIMENTIN (MCV) AB: <20 U (ref <20)
Rheumatoid Factor (IgA): <36.7 U/mL — AB
Rheumatoid Factor (IgG): <5 U
Rheumatoid Factor (IgM): <16 U
Rheumatoid Factor (IgM): <5 U
SM/RNP: <1 AI
SM/RNP: <1 AI
SSA (Ro) (ENA) Antibody, IgG: <8 AI — AB
SSB (La) (ENA) Antibody, IgG: <8 AI — AB
Scleroderma (Scl-70) (ENA) Antibody, IgG: <1 AI
Scleroderma (Scl-70) (ENA) Antibody, IgG: <1 AI
Thyroperoxidase Ab SerPl-aCnc: <1 [IU]/mL (ref <9)
Thyroperoxidase Ab SerPl-aCnc: <20 U/mL (ref <9)

## 2024-04-15 LAB — THYROID PANEL WITH TSH
Free Thyroxine Index: 2 (ref 1.4–3.8)
T3 Uptake: 30 % (ref 22–35)
T4, Total: 6.8 ug/dL (ref 5.1–11.9)
TSH: 1.56 m[IU]/L

## 2024-04-15 LAB — SURGICAL PATHOLOGY

## 2024-04-15 LAB — ANTI-NUCLEAR AB-TITER (ANA TITER): ANA Titer 1: 1:1280 {titer} — ABNORMAL HIGH

## 2024-04-15 LAB — ANTI-DSDNA ANTIBODIES: Cardiolipin Ab (IgM): 1:320 {titer} — ABNORMAL HIGH

## 2024-04-15 LAB — GLUCOSE, CAPILLARY
Glucose-Capillary: 191 mg/dL — ABNORMAL HIGH (ref 70–99)
Glucose-Capillary: 310 mg/dL — ABNORMAL HIGH (ref 70–99)
Glucose-Capillary: 327 mg/dL — ABNORMAL HIGH (ref 70–99)
Glucose-Capillary: 327 mg/dL — ABNORMAL HIGH (ref 70–99)
Glucose-Capillary: 345 mg/dL — ABNORMAL HIGH (ref 70–99)

## 2024-04-15 MED ORDER — INSULIN GLARGINE-YFGN 100 UNIT/ML ~~LOC~~ SOLN
45.0000 [IU] | Freq: Two times a day (BID) | SUBCUTANEOUS | Status: DC
  Administered 2024-04-15 (×2): 45 [IU] via SUBCUTANEOUS
  Filled 2024-04-15 (×3): qty 0.45

## 2024-04-15 MED ORDER — BENZTROPINE MESYLATE 1 MG PO TABS: 1.0000 mg | ORAL_TABLET | Freq: Every day | ORAL | Status: DC | PRN

## 2024-04-15 MED ORDER — FLUTICASONE PROPIONATE 50 MCG/ACT NA SUSP: 2.0000 | Freq: Every day | NASAL | Status: DC | PRN

## 2024-04-15 MED ORDER — BACLOFEN 10 MG PO TABS
10.0000 mg | ORAL_TABLET | Freq: Two times a day (BID) | ORAL | Status: DC
  Administered 2024-04-15 – 2024-04-16 (×3): 10 mg via ORAL
  Filled 2024-04-15 (×3): qty 1

## 2024-04-15 MED ORDER — MIRTAZAPINE 15 MG PO TABS
15.0000 mg | ORAL_TABLET | Freq: Every day | ORAL | Status: DC
  Administered 2024-04-15: 15 mg via ORAL
  Filled 2024-04-15: qty 1

## 2024-04-15 MED ORDER — ACETAMINOPHEN 325 MG PO TABS
650.0000 mg | ORAL_TABLET | Freq: Four times a day (QID) | ORAL | Status: DC | PRN
  Administered 2024-04-15: 650 mg via ORAL
  Filled 2024-04-15: qty 2

## 2024-04-15 MED ORDER — INSULIN ASPART 100 UNIT/ML IJ SOLN
30.0000 [IU] | Freq: Three times a day (TID) | INTRAMUSCULAR | Status: DC
  Administered 2024-04-15 – 2024-04-16 (×4): 30 [IU] via SUBCUTANEOUS
  Filled 2024-04-15 (×3): qty 1

## 2024-04-15 NOTE — Progress Notes (Signed)
  Progress Note    04/15/2024 10:17 AM 1 Day Post-Op  Subjective: Alison [name] is a 48 year old female who is now postop day 1 from a right superficial temporal artery biopsy.  Patient is resting comfortably in bed this morning.  She endorses no pain or soreness.  She denies any difficulties with her vision.  She denies any headaches dizziness nausea vomiting or diarrhea this morning.  No other complaints overnight.  Vitals are remained stable.   Vitals:   04/15/24 0456 04/15/24 0802  BP: 114/71 117/61  Pulse: (!) 47 (!) 44  Resp: 18 16  Temp: 98.2 F (36.8 C) 98.4 F (36.9 C)  SpO2: 94% 95%   Physical Exam: Cardiac:  RRR, normal S1 and S2.  No rubs clicks gallops or murmurs noted. Lungs: Clear throughout on auscultation, nonlabored breathing.  No rales rhonchi or wheezing noted. Incisions: Right temporal area.  No hematoma seroma to note incisions closed with Dermabond. Extremities: All extremities with palpable pulses and warm to touch. Abdomen: Positive bowel sounds throughout, soft, nontender and nondistended. Neurologic: Alert and oriented x 3, answers all questions and follows commands appropriately.  Sensation is grossly intact in all extremities.  Strength is symmetrical.  Speech is fluent.    CBC    Component Value Date/Time   WBC 9.3 04/13/2024 0406   RBC 4.15 04/13/2024 0406   HGB 11.7 (L) 04/13/2024 0406   HGB 11.9 (L) 03/03/2024 0945   HGB 14.6 10/16/2021 1122   HCT 35.1 (L) 04/13/2024 0406   HCT 44.0 10/16/2021 1122   PLT 185 04/13/2024 0406   PLT 223 03/03/2024 0945   MCV 84.6 04/13/2024 0406   MCV 88 10/16/2021 1122   MCH 28.2 04/13/2024 0406   MCHC 33.3 04/13/2024 0406   RDW 14.5 04/13/2024 0406   RDW 13.7 10/16/2021 1122   LYMPHSABS 1.8 03/14/2024 2145   LYMPHSABS 2.0 10/16/2021 1122   MONOABS 0.7 03/14/2024 2145   EOSABS 72 04/01/2024 1043   EOSABS 0.4 10/16/2021 1122   BASOSABS 54 04/01/2024 1043   BASOSABS 0.1 10/16/2021 1122    BMET     Component Value Date/Time   NA 132 (L) 04/13/2024 0406   K 4.5 04/13/2024 0406   CL 95 (L) 04/13/2024 0406   CO2 23 04/13/2024 0406   GLUCOSE 436 (H) 04/14/2024 2237   BUN 27 (H) 04/13/2024 0406   CREATININE 1.12 (H) 04/13/2024 0406   CREATININE 1.14 (H) 04/01/2024 1043   CALCIUM  9.3 04/13/2024 0406   CALCIUM  7.6 (L) 06/27/2022 1448   GFRNONAA 60 (L) 04/13/2024 0406   GFRNONAA >60 03/03/2024 0958   GFRNONAA 64 12/11/2020 0000   GFRAA 74 12/11/2020 0000    INR    Component Value Date/Time   INR 1.0 03/15/2024 0619    No intake or output data in the 24 hours ending 04/15/24 1017   Assessment/Plan:  48 y.o. female is s/p right temporal artery biopsy.  1 Day Post-Op   Plan No complications postoperatively.  Incisions clean dry and intact with Dermabond glue.  Per vascular surgery patient may be discharged home and follow-up with vascular surgery in clinic in 3 weeks for wound check.   DVT prophylaxis: Heparin  5000 units subcutaneous every 8 hours.   Alison [name] [facility] 04/15/2024 10:17 AM

## 2024-04-15 NOTE — Progress Notes (Addendum)
 Progress Note   [name] FMW:[id number] DOB: 1975-10-23 DOA: 04/11/2024     4 DOS: the patient was seen and examined on 04/15/2024   Brief hospital course: Taken from H&P.  Alison [name]  is a 48 y.o. female, with medical history significant for morbid obesity, lymphoma bilateral parotid gland s/p Rituxan  in 2020, diffuse large B-cell lymphoma,  lupus s/p liver transplant, currently on tacrolimus , neuropathy, major depressive disorder, sleep apnea presents to ED secondary to complaints of right eye pain, and headache for about 10 days.  Denies any change in vision, fever or chills.  ED provider discussed with ophthalmology, they requested MRI of brain and orbit due to concern of autoimmune process, imaging without contrast with concern of temporal arteritis versus optic perineuritis.  Neurology and vascular surgery was consulted and patient was started on Solu-Medrol .  7/7: Vital stable, labs with elevated blood glucose level, likely secondary to steroid, CRP was normal at 0.6, and mildly elevated ESR which is better than his baseline with history of chronic illnesses is not compatible with temporal arteritis.  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  No evidence of enhancement within the optic nerve.  Pending ophthalmology evaluation. Neurology also ordered lumbar puncture. Continuing steroids for now Temporal artery biopsy is planned for Wednesday with vascular surgery.  7/8: Symptoms improving, CSF culture with no organism, ophthalmology signed off as there was no obvious abnormality. Likely autoimmune with her history of lupus. Neurology is suggesting 3 doses of high-dose steroid followed by prednisone  50 mg daily-need discharge orders for at least 2 weeks and outpatient neurology will follow-up and taper accordingly. CBG remained elevated secondary to high-dose steroid requiring extra insulin .   I assumed care on  04/14/24.  7/9 - temporal artery biopsy done by vascular surgery this AM.  Insulin  regimen escalated due to severe steroid-induced hyperglycemia.   Assessment and Plan:  * Eye pain - improving since admission Optic perineuritis/giant cell arteritis. Imaging concerning for optic perineuritis, no involvement of optic nerve.  No changes in vision.  Inflammatory markers with mildly elevated ESR and normal CRP are not very consistent with temporal arteritis but in order to completely rule it out vascular surgery was consulted for temporal artery biopsy - done 7/9 AM.   Patient has history of significant autoimmune disorders. S/p LP, preliminary CSF cultures negative --Ophthalmology signed off as there is no ophthalmology reason, likely autoimmune --Neurology signed off  - Completed high-dose IV steroid for total 3 doses --Continue prednisone  50 mg daily-will need prescription for at least 2-weeks --Duration and tapering of steroids to be determined at neurology follow up --Temporal artery biopsy done 7/9 AM - follow path results --Outpatient Neurology follow up with Dr. Maree within 2 weeks of d/c --Vascular surgery follow up in 3 weeks to check biopsy site   Diabetes mellitus without complication (HCC) Steroid-induced hyperglycemia CBG elevated likely secondary to steroid use. Appears pt received 154 units total insulin  yesterday and sugars remain severely elevated after 3 doses of high-dose IV steroid and still on prednisone  50 mg. --Increase Semglee  30 >> 35 >> 45 units BID --Increasing Novolog  10 >> 20 >> 30 units TID WC --Continue 0-20 units sliding scale Novolog  --Titrate regimen   S/P liver transplant (HCC) - Continue home tacrolimus   Chronic pain - Continuing home medications  Bipolar disorder (manic depression) (HCC) - Continue home psych meds  Morbid obesity (HCC) Estimated body mass index is 39.54 kg/m as calculated from  the following:   Height as of this encounter: 5'  6 (1.676 m).   Weight as of this encounter: 111.1 kg.  - This will complicate overall prognosis -Encouraged weight loss  Hypertension Blood pressure currently within goal. Currently home lisinopril  has been held by our patient provider for about a month. - Continue to monitor -Will restart lisinopril  if needed  Marginal zone B-cell lymphoma (HCC) - Outpatient follow-up  OSA on CPAP - CPAP at night      Subjective: Pt seen awake resting in bed this AM.  She reports feel okay, no acute complaints.  Not having significant pain at the biopsy site.  Minimal remaining right eye pain and vision is normal in that eye.  Discussed further increasing insulin  today.    Physical Exam: Vitals:   04/14/24 2036 04/14/24 2038 04/15/24 0456 04/15/24 0802  BP: 119/63  114/71 117/61  Pulse: 60  (!) 47 (!) 44  Resp: 18  18 16   Temp:  98.3 F (36.8 C) 98.2 F (36.8 C) 98.4 F (36.9 C)  TempSrc:      SpO2: 94%  94% 95%  Weight:      Height:       General exam: awake, alert, no acute distress, obese HEENT: right temporal incision with dermabond in place no bleeding or surrounding erythema or swelling, moist mucus membranes, hearing grossly normal  Respiratory system: on room air, normal respiratory effort. Cardiovascular system: normal S1/S2, RRR Central nervous system: A&O x3. no gross focal neurologic deficits, normal speech Extremities: moves all, no edema, normal tone Skin: dry, intact, normal temperature Psychiatry: normal mood, congruent affect, judgement and insight appear normal   Data Reviewed:  CBG's -- 377 >> 468 >> 451 >> 434 >> 345 >> 191 >> 327  Family Communication: None present. Will attempt to call as time allows.  Disposition: Status is: Inpatient Remains inpatient appropriate because: severe hyperglycemia requires improvement before d/c to prevent readmission for DKA or HHS, adjusting insulin  regimen.   Planned Discharge Destination: Home    Time spent: 42  minutes  Author: Burnard DELENA Cunning, DO 04/15/2024 1:33 PM  For on call review [email or web address].

## 2024-04-16 ENCOUNTER — Telehealth

## 2024-04-16 DIAGNOSIS — R739 Hyperglycemia, unspecified: Secondary | ICD-10-CM

## 2024-04-16 DIAGNOSIS — T380X5A Adverse effect of glucocorticoids and synthetic analogues, initial encounter: Secondary | ICD-10-CM

## 2024-04-16 DIAGNOSIS — H469 Unspecified optic neuritis: Secondary | ICD-10-CM | POA: Diagnosis not present

## 2024-04-16 LAB — CSF CULTURE W GRAM STAIN
Culture: NO GROWTH
Gram Stain: NONE SEEN
Special Requests: 3

## 2024-04-16 LAB — GLUCOSE, CAPILLARY
Glucose-Capillary: 116 mg/dL — ABNORMAL HIGH (ref 70–99)
Glucose-Capillary: 169 mg/dL — ABNORMAL HIGH (ref 70–99)
Glucose-Capillary: 176 mg/dL — ABNORMAL HIGH (ref 70–99)
Glucose-Capillary: 141 mg/dL — ABNORMAL HIGH (ref 70–99)

## 2024-04-16 MED ORDER — NOVOLOG FLEXPEN 100 UNIT/ML ~~LOC~~ SOPN: 40.0000 [IU] | PEN_INJECTOR | Freq: Three times a day (TID) | SUBCUTANEOUS | 11 refills | Status: AC

## 2024-04-16 MED ORDER — LANTUS SOLOSTAR 100 UNIT/ML ~~LOC~~ SOPN: 40.0000 [IU] | PEN_INJECTOR | Freq: Two times a day (BID) | SUBCUTANEOUS | 0 refills | Status: AC

## 2024-04-16 MED ORDER — PANTOPRAZOLE SODIUM 40 MG PO TBEC: 40.0000 mg | DELAYED_RELEASE_TABLET | Freq: Every day | ORAL | 0 refills | Status: AC

## 2024-04-16 MED ORDER — INSULIN GLARGINE-YFGN 100 UNIT/ML ~~LOC~~ SOLN
50.0000 [IU] | Freq: Two times a day (BID) | SUBCUTANEOUS | Status: DC
  Administered 2024-04-16: 50 [IU] via SUBCUTANEOUS
  Filled 2024-04-16 (×2): qty 0.5

## 2024-04-16 MED ORDER — PREDNISONE 50 MG PO TABS: ORAL_TABLET | ORAL | 0 refills | Status: DC

## 2024-04-16 MED ORDER — INSULIN ASPART 100 UNIT/ML IJ SOLN
40.0000 [IU] | Freq: Three times a day (TID) | INTRAMUSCULAR | Status: DC
  Administered 2024-04-16 (×2): 40 [IU] via SUBCUTANEOUS
  Filled 2024-04-16 (×2): qty 1

## 2024-04-16 NOTE — Discharge Summary (Addendum)
 Physician Discharge Summary   [name] MRN: [id number] DOB: Apr 12, 1976  Admit date:     04/11/2024  Discharge date: 04/16/2024  Discharge Physician: Burnard DELENA Cunning   PCP: Gareth Mliss FALCON, FNP   Recommendations at discharge:   Follow up as scheduled with Vascular Surgery for wound/incision check Follow up with Neurology within 2 weeks Follow up with Endocrinology for steroid-induced hyperglycemia Follow up with Ophthalmology Follow up with Primary Care in 1-2 weeks  Discharge Diagnoses: Principal Problem:   Eye pain Active Problems:   Diabetes mellitus without complication (HCC)   S/P liver transplant (HCC)   Chronic pain   Bipolar disorder (manic depression) (HCC)   Morbid obesity (HCC)   Hypertension   Systemic lupus erythematosus (SLE) in adult (HCC)   Trigeminal neuralgia   Migraine   OSA on CPAP   Marginal zone B-cell lymphoma (HCC)   Neuropathic pain   Neuroleptic induced parkinsonism (HCC)   Sjogren's syndrome (HCC)   Ocular pain, right eye   Intractable episodic headache   Optic perineuritis   Steroid-induced hyperglycemia  Resolved Problems:   * No resolved hospital problems. [facility] Course:  Taken from H&P.   [name]  is a 48 y.o. female, with medical history significant for morbid obesity, lymphoma bilateral parotid gland s/p Rituxan  in 2020, diffuse large B-cell lymphoma,  lupus s/p liver transplant, currently on tacrolimus , neuropathy, major depressive disorder, sleep apnea presents to ED secondary to complaints of right eye pain, and headache for about 10 days.  Denies any change in vision, fever or chills.   ED provider discussed with ophthalmology, they requested MRI of brain and orbit due to concern of autoimmune process, imaging without contrast with concern of temporal arteritis versus optic perineuritis.   Neurology and vascular surgery was consulted and patient was started on Solu-Medrol .   7/7: Vital stable, labs  with elevated blood glucose level, likely secondary to steroid, CRP was normal at 0.6, and mildly elevated ESR which is better than his baseline with history of chronic illnesses is not compatible with temporal arteritis.  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  No evidence of enhancement within the optic nerve.  Pending ophthalmology evaluation. Neurology also ordered lumbar puncture. Continuing steroids for now Temporal artery biopsy is planned for Wednesday with vascular surgery.   7/8: Symptoms improving, CSF culture with no organism, ophthalmology signed off as there was no obvious abnormality. Likely autoimmune with her history of lupus. Neurology is suggesting 3 doses of high-dose steroid followed by prednisone  50 mg daily-need discharge orders for at least 2 weeks and outpatient neurology will follow-up and taper accordingly. CBG remained elevated secondary to high-dose steroid requiring extra insulin .     I assumed care on 04/14/24.   7/9 - temporal artery biopsy done by vascular surgery this AM.  Insulin  regimen escalated due to severe steroid-induced hyperglycemia.    7/11 -- CBG's have improved after significantly escalating insulin  regimen Patient is medically stable and agreeable for discharge home. She has close follow up with her endocrinologist already scheduled.    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  No changes in vision.  Inflammatory markers with mildly elevated ESR and normal CRP are not very consistent with temporal arteritis but in order to completely rule it out vascular surgery  was consulted for temporal artery biopsy - done 7/9 AM.   Patient has history of significant autoimmune disorders. S/p LP, preliminary CSF cultures  negative --Ophthalmology signed off as there is no ophthalmology reason, likely autoimmune - follow up outpatient --Neurology signed off -- follow up at [facility] as outpatient --Completed high-dose IV steroid for total 3 doses --Continue prednisone  50 mg daily-will need prescription for at least 2-weeks --Duration and tapering of steroids to be determined at neurology follow up --Temporal artery biopsy done 7/9 AM - follow path results --Outpatient Neurology follow up with Dr. Maree within 2 weeks of d/c --Vascular surgery follow up in 3 weeks to check biopsy site     Diabetes mellitus without complication (HCC) Steroid-induced hyperglycemia CBG elevated likely secondary to steroid use. Appears pt received 154 units total insulin  yesterday and sugars remain severely elevated after 3 doses of high-dose IV steroid and still on prednisone  50 mg. --Continue basal insulin  at increased dose of 40 units BID --Short-actinig 40 units TID with meals +sliding scale --Resume home glipizide , metformin  --Titrate regimen --Follow up closely with Endocrinology     S/P liver transplant (HCC) - Continue home tacrolimus    Chronic pain - Continuing home medications   Bipolar disorder (manic depression) (HCC) - Continue home psych meds   Morbid obesity (HCC) Estimated body mass index is 39.54 kg/m as calculated from the following:   Height as of this encounter: 5' 6 (1.676 m).   Weight as of this encounter: 111.1 kg.  - This will complicate overall prognosis -Encouraged weight loss   Hypertension Blood pressure currently within goal. Currently home lisinopril  has been held by our patient provider for about a month. - Continue to monitor -Will restart lisinopril  if needed   Marginal zone B-cell lymphoma (HCC) - Outpatient follow-up   OSA on CPAP - CPAP at night       Consultants: as above Procedures performed: temporal artery biopsy   Disposition: Home  Diet recommendation:   Discharge Diet Orders (From admission, onward)     Start     Ordered   04/16/24 0000  Diet - low sodium heart healthy        04/16/24 1617           Carb modified diet DISCHARGE MEDICATION: Allergies as of 04/16/2024       Reactions   Penicillin G Itching, Rash, Anaphylaxis, Palpitations   Lamictal  [lamotrigine ] Rash   Tape    Carbamazepine Other (See Comments)   Medication interaction-prograf    Hydrocodone-acetaminophen  Itching   Naproxen Itching   Penicillins    Vancomycin  Rash   Macular/blotchy rash at infusion site        Medication List     PAUSE taking these medications    lisinopril  20 MG tablet Wait to take this until your doctor or other care provider tells you to start again. Commonly known as: ZESTRIL  TAKE 1 TABLET BY MOUTH ONCE DAILY *PATIENT WILL NEED TO SCHEDULE AN APPOINTMENT FOR ADDITIONAL REFILLS*       TAKE these medications    Accu-Chek Softclix Lancets lancets 2 (two) times daily.   albuterol  108 (90 Base) MCG/ACT inhaler Commonly known as: VENTOLIN  HFA Inhale 2 puffs into the lungs every 6 (six) hours as needed for wheezing or shortness of breath.   AZO CRANBERRY PO Take by mouth.   baclofen  10 MG tablet Commonly known as: LIORESAL  Take 1 tablet by mouth 2 (two) times daily.   benztropine  1 MG tablet Commonly known  as: COGENTIN  TAKE 1 TABLET BY MOUTH ONCE DAILY AS NEEDED FOR TREMORS   fluticasone  50 MCG/ACT nasal spray Commonly known as: FLONASE  Place into the nose.   FreeStyle Libre 3 Plus Sensor Misc CHANGE SENSOR EVERY 15 DAYS What changed: See the new instructions.   glipiZIDE  10 MG 24 hr tablet Commonly known as: GLUCOTROL  XL TAKE 1 TABLET BY MOUTH IN THE MORNING AND AT BEDTIME   Insulin  Pen Needle 32G X 6 MM Misc 1 each by Does not apply route as needed.   Lantus  SoloStar 100 UNIT/ML Solostar Pen Generic drug: insulin  glargine Inject 40 Units into the skin 2 (two) times daily. What changed: when to take  this   lurasidone  20 MG Tabs tablet Commonly known as: LATUDA  TAKE 1 TABLET BY MOUTH ONCE DAILY WITH SUPPER *REFILL REQUEST*   magnesium  oxide 400 MG tablet Commonly known as: MAG-OX Take 1,000 mg by mouth 2 (two) times daily.   Melatonin 10 MG Tabs Take 10 mg by mouth at bedtime.   metFORMIN  1000 MG tablet Commonly known as: GLUCOPHAGE  TAKE 1 TABLET BY MOUTH TWICE DAILY WITH MEALS *PATIENT WILL NEED TO SCHEDULE APPOINTMENT FOR ADDITIONAL REFILLS*   mirabegron  ER 50 MG Tb24 tablet Commonly known as: Myrbetriq  Take 1 tablet (50 mg total) by mouth daily.   mirtazapine  15 MG tablet Commonly known as: REMERON  TAKE 1 TABLET BY MOUTH EVERY DAY AT BEDTIME *REFILL REQUEST*   naloxone  4 MG/0.1ML Liqd nasal spray kit Commonly known as: NARCAN  Place 0.4 mg into the nose once.   NovoLOG  FlexPen 100 UNIT/ML FlexPen Generic drug: insulin  aspart Inject 40 Units into the skin 3 (three) times daily with meals. Hold if not eating at least half of a full meal. What changed:  how much to take additional instructions   ondansetron  4 MG tablet Commonly known as: ZOFRAN  Take 1 tablet (4 mg total) by mouth every 6 (six) hours as needed for nausea.   oxyCODONE  5 MG immediate release tablet Commonly known as: Oxy IR/ROXICODONE  Take 5 mg by mouth 3 (three) times daily as needed.   pantoprazole  40 MG tablet Commonly known as: PROTONIX  Take 1 tablet (40 mg total) by mouth daily. Recommend using while on Prednisone . Start taking on: April 17, 2024   Precision QID Test test strip Generic drug: glucose blood   predniSONE  50 MG tablet Commonly known as: DELTASONE  Take one tablet (50 mg) by mouth once daily until follow up with Neurology. Start taking on: April 17, 2024   pregabalin  200 MG capsule Commonly known as: LYRICA  Take 1 capsule (200 mg total) by mouth 2 (two) times daily.   rosuvastatin  10 MG tablet Commonly known as: Crestor  Take 1 tablet (10 mg total) by mouth daily.    Semaglutide  (1 MG/DOSE) 4 MG/3ML Sopn Inject 1 mg into the skin once a week.   tacrolimus  1 MG capsule Commonly known as: PROGRAF  Take 1 capsule by mouth every morning.   Tylenol  PM Extra Strength 1000-50 MG/30ML Liqd Generic drug: diphenhydrAMINE -APAP (sleep) Take by mouth.   Ubrelvy  50 MG Tabs Generic drug: Ubrogepant  Take 1 tablet by mouth daily as needed.   venlafaxine  XR 75 MG 24 hr capsule Commonly known as: EFFEXOR -XR Take 1 capsule (75 mg total) by mouth daily with breakfast. Take along with 37.5 mg daily   venlafaxine  XR 37.5 MG 24 hr capsule Commonly known as: EFFEXOR -XR Take 37.5 mg by mouth every morning.        Follow-up Information  Gareth Mliss FALCON, FNP. Call in 1 day.   Specialty: Nurse Practitioner Why: For ER visit follow-up and reevaluation. Contact information: [address] [place] [address] [phone]         Elisabeth Gwendlyn SAUNDERS, NP Follow up in 3 week(s).   Specialty: Vascular Surgery Why: Post Op check Contact information: [address] [place] [address] [phone]                Discharge Exam: Fredricka Weights   04/11/24 1625  Weight: 111.1 kg   General exam: awake, alert, no acute distress, obese HEENT: right temporal biopsy incision without any surrounding erythema, swelling or other changes, dermabond in place, moist mucus membranes, hearing grossly normal  Respiratory system: CTAB, no wheezes, rales or rhonchi, normal respiratory effort. Cardiovascular system: normal S1/S2, RRR,  no pedal edema.   Gastrointestinal system: soft, NT, ND Central nervous system: A&O x4. no gross focal neurologic deficits, normal speech Extremities: moves all, no edema, normal tone Skin: dry, intact, normal temperature Psychiatry: normal mood, congruent affect, judgement and insight appear normal   Condition at discharge: stable  The results of significant diagnostics from this hospitalization  (including imaging, microbiology, ancillary and laboratory) are listed below for reference.   Imaging Studies: DG FL GUIDED LUMBAR PUNCTURE Result Date: 04/12/2024 CLINICAL DATA:  [id number] Optic neuritis 5770 48 year old female. Endorsing headache times 10 days. Found to have what appears to be right optic perineuritis. Team is requesting lumbar puncture for further evaluation EXAM: LUMBAR PUNCTURE UNDER FLUOROSCOPY PROCEDURE: An appropriate skin entry site was determined fluoroscopically. Operator donned sterile gloves and mask. Skin site was marked, then prepped with Betadine , draped in usual sterile fashion, and infiltrated locally with 1% lidocaine . A 20 gauge 6 inch spinal needle advanced into the thecal sac at L4-5 from a right interlaminar approach. Clear colorless CSF spontaneously returned, with opening pressure of 18.4 Cm water. 13.5 ml CSF were collected and divided among 4 sterile vials for the requested laboratory studies. The needle was then removed. The patient tolerated the procedure well and there were no complications. FLUOROSCOPY: Radiation Exposure Index and estimated peak skin dose (PSD); Reference air kerma (RAK), 19.5 mGy. Kerma-area product (KAP), 287.9 uGy*m. IMPRESSION: Successful diagnostic lumbar puncture under fluoroscopy. This exam was performed by Delon Beagle NP and was supervised and interpreted by Dr. Thom Hall Electronically Signed   By: Thom Hall M.D.   On: 04/12/2024 13:20   MR ORBITS W WO CONTRAST Result Date: 04/12/2024 CLINICAL DATA:  Concern for optic neuritis, edema in the intraconal fat of the right orbit. Right eye pain and headache for several days. EXAM: MRI OF THE ORBITS WITHOUT AND WITH CONTRAST TECHNIQUE: Multiplanar, multi-echo pulse sequences of the orbits and surrounding structures were acquired including fat saturation techniques, before and after intravenous contrast administration. CONTRAST:  10mL GADAVIST  GADOBUTROL  1 MMOL/ML IV SOLN COMPARISON:   MRI head and orbits 04/11/2024. FINDINGS: Orbits: Globes are intact. Lenses are normally located. No evidence of intra-ocular hemorrhage or mass. Extraocular muscles are unremarkable. Redemonstrated mild edema surrounding the intraorbital right optic nerve particularly along the inferior medial aspect of the nerve. There is mild associated enhancement along the optic nerve sheath and faint enhancement within the adjacent intraconal fat. There is no evidence of enhancement within the substance of the optic nerve. No abnormal enhancement or edema within the left orbit. Normal appearance of the lacrimal glands. Vascular structures are unremarkable. Visualized sinuses: Small focus of mucosal thickening  within the left sphenoid sinus. No air-fluid levels. Small bilateral mastoid effusions, right greater than left. Soft tissues: Normal. Limited intracranial: No acute or significant finding. IMPRESSION: Redemonstrated edema along the intraorbital right optic nerve with mild enhancement along the optic nerve sheath and within the adjacent intraconal fat suggestive of optic perineuritis. No evidence of enhancement within the substance of the optic nerve. Electronically Signed   By: Donnice Mania M.D.   On: 04/12/2024 10:10   MR BRAIN WO CONTRAST Result Date: 04/11/2024 CLINICAL DATA:  Abdominal plegia and headache.  Right eye pain. EXAM: MRI HEAD AND ORBITS WITHOUT CONTRAST TECHNIQUE: Multiplanar, multi-echo pulse sequences of the brain and surrounding structures were acquired without intravenous contrast. Multiplanar, multi-echo pulse sequences of the orbits and surrounding structures were acquired including fat saturation techniques, without intravenous contrast administration. COMPARISON:  05/29/2022 FINDINGS: MRI HEAD FINDINGS Brain: No acute infarct, mass effect or extra-axial collection. No chronic microhemorrhage or siderosis. Normal white matter signal, parenchymal volume and CSF spaces. The midline structures are  normal. Vascular: Normal flow voids. Skull and upper cervical spine: Normal marrow signal. Other: None. MRI ORBITS FINDINGS Orbits: There is mild edema within the intraconal fat of the right orbit. Otherwise, the orbits are normal. Visualized sinuses: Small amount of bilateral mastoid fluid. Paranasal sinuses are clear. Soft tissues: Normal. IMPRESSION: 1. Mild edema within the intraconal fat of the right orbit, compatible with optic perineuritis. This finding may also be associated with temporal arteritis. 2. Normal MRI of the brain. Electronically Signed   By: Franky Stanford M.D.   On: 04/11/2024 22:45   MR ORBITS WO CONTRAST Result Date: 04/11/2024 CLINICAL DATA:  Abdominal plegia and headache.  Right eye pain. EXAM: MRI HEAD AND ORBITS WITHOUT CONTRAST TECHNIQUE: Multiplanar, multi-echo pulse sequences of the brain and surrounding structures were acquired without intravenous contrast. Multiplanar, multi-echo pulse sequences of the orbits and surrounding structures were acquired including fat saturation techniques, without intravenous contrast administration. COMPARISON:  05/29/2022 FINDINGS: MRI HEAD FINDINGS Brain: No acute infarct, mass effect or extra-axial collection. No chronic microhemorrhage or siderosis. Normal white matter signal, parenchymal volume and CSF spaces. The midline structures are normal. Vascular: Normal flow voids. Skull and upper cervical spine: Normal marrow signal. Other: None. MRI ORBITS FINDINGS Orbits: There is mild edema within the intraconal fat of the right orbit. Otherwise, the orbits are normal. Visualized sinuses: Small amount of bilateral mastoid fluid. Paranasal sinuses are clear. Soft tissues: Normal. IMPRESSION: 1. Mild edema within the intraconal fat of the right orbit, compatible with optic perineuritis. This finding may also be associated with temporal arteritis. 2. Normal MRI of the brain. Electronically Signed   By: Franky Stanford M.D.   On: 04/11/2024 22:45   CT Head  Wo Contrast Result Date: 04/11/2024 CLINICAL DATA:  headache EXAM: CT HEAD WITHOUT CONTRAST TECHNIQUE: Contiguous axial images were obtained from the base of the skull through the vertex without intravenous contrast. RADIATION DOSE REDUCTION: This exam was performed according to the departmental dose-optimization program which includes automated exposure control, adjustment of the mA and/or kV according to patient size and/or use of iterative reconstruction technique. COMPARISON:  MRI head 05/29/2022. FINDINGS: Brain: No evidence of acute infarction, hemorrhage, hydrocephalus, extra-axial collection or mass lesion/mass effect. Chronic partially empty sella. Vascular: No hyperdense vessel or unexpected calcification. Skull: No acute fracture. Sinuses/Orbits: Clear sinuses.  No acute orbital finding. IMPRESSION: No evidence of acute intracranial abnormality. Electronically Signed   By: Gilmore GORMAN Molt M.D.   On: 04/11/2024 16:51  CT Angio Chest PE W and/or Wo Contrast Result Date: 03/29/2024 CLINICAL DATA:  Chest pain, fever, and leukocytosis.  Cough. EXAM: CT ANGIOGRAPHY CHEST WITH CONTRAST TECHNIQUE: Multidetector CT imaging of the chest was performed using the standard protocol during bolus administration of intravenous contrast. Multiplanar CT image reconstructions and MIPs were obtained to evaluate the vascular anatomy. RADIATION DOSE REDUCTION: This exam was performed according to the departmental dose-optimization program which includes automated exposure control, adjustment of the mA and/or kV according to patient size and/or use of iterative reconstruction technique. CONTRAST:  75mL OMNIPAQUE  IOHEXOL  350 MG/ML SOLN COMPARISON:  Chest CT dated 03/14/2024. FINDINGS: Cardiovascular: Top-normal cardiac size. No pericardial effusion. The thoracic aorta is unremarkable. The origins of the great vessels of the aortic arch appear patent. Evaluation of the pulmonary arteries is limited due to respiratory motion.  No pulmonary artery embolus identified. Mediastinum/Nodes: Mildly enlarged right hilar lymph node measures 15 mm short axis and similar to prior CT. The esophagus is grossly unremarkable. No mediastinal fluid collection. Lungs/Pleura: Small right upper lobe cyst. No focal consolidation, pleural effusion, or pneumothorax. Bibasilar linear atelectasis/scarring. The central airways are patent. Upper Abdomen: No acute abnormality. Musculoskeletal: No acute osseous pathology. Review of the MIP images confirms the above findings. IMPRESSION: No acute intrathoracic pathology. No CT evidence of pulmonary artery embolus. Electronically Signed   By: Vanetta Chou M.D.   On: 03/29/2024 11:18   DG Chest Port 1 View Result Date: 03/29/2024 CLINICAL DATA:  Chest pain EXAM: PORTABLE CHEST 1 VIEW COMPARISON:  03/14/2024 FINDINGS: There is a left chest wall port a catheter with tip in the superior cavoatrial junction. Stable cardiomediastinal contours. Lung volumes are low. Slight asymmetric elevation of the right hemidiaphragm, unchanged. No pleural fluid, interstitial edema or airspace disease. IMPRESSION: Low lung volumes. No acute findings. Electronically Signed   By: Waddell Calk M.D.   On: 03/29/2024 05:30    Microbiology: Results for orders placed or performed during the hospital encounter of 04/11/24  CSF culture w Gram Stain     Status: None   Collection Time: 04/12/24 12:53 PM   Specimen: PATH Cytology CSF; Cerebrospinal Fluid  Result Value Ref Range Status   Specimen Description   Final    CSF Performed at [facility], [address] [place], [place] [address]    Special Requests   Final    3 Performed at [facility], [address] [place], [place] [address]    Gram Stain   Final    NO ORGANISMS SEEN WBC SEEN RED BLOOD CELLS Performed at [facility], [address] [place], [place] [address]    Culture   Final    NO GROWTH 3 DAYS Performed at [facility], [address] [place], [place] [address]    Report Status 04/16/2024 FINAL  Final    Labs: CBC: Recent Labs  Lab 04/11/24 2008 04/12/24 0500 04/13/24 0406  WBC 9.6 8.0 9.3  HGB 12.2 11.9* 11.7*  HCT 37.4 36.3 35.1*  MCV 87.0 86.8 84.6  PLT 161 163 185   Basic Metabolic Panel: Recent Labs  Lab 04/11/24 2008 04/12/24 0500 04/13/24 0406 04/14/24 2237  NA 137 136 132*  --   K 4.7 4.5 4.5  --   CL 103 102 95*  --   CO2 24 24 23   --   GLUCOSE 230* 242* 337* 436*  BUN 13 14 27*  --   CREATININE 0.92 0.88 1.12*  --  CALCIUM  9.0 9.3 9.3  --    Liver Function Tests: Recent Labs  Lab 04/11/24 2008 04/12/24 0500  AST 20 19  ALT 16 15  ALKPHOS 64 67  BILITOT 0.7 1.0  PROT 7.1 7.1  ALBUMIN 3.6 3.5   CBG: Recent Labs  Lab 04/15/24 1546 04/15/24 2007 04/16/24 0810 04/16/24 1219 04/16/24 1547  GLUCAP 327* 310* 116* 169* 176*    Discharge time spent: greater than 30 minutes.  Signed: Burnard DELENA Cunning, DO [facility] 04/16/2024

## 2024-04-16 NOTE — TOC Initial Note (Signed)
 Transition of Care [facility]) - Initial/Assessment Note    Patient Details  Name: Alison [name] MRN: [id number] Date of Birth: 07-30-1976  Transition of Care [facility]) CM/SW Contact:    Elouise LULLA Capri, RN 04/16/2024, 4:45 PM  Clinical Narrative:                  CM to patient's room regarding case management assessment. Per patient lives with fiance and fiance's brother, Ubaldo. Per patient uses a cane and a powered chair. Per patient is independent in ADLs. Private transportation arranged.  Expected Discharge Plan: Home/Self Care Barriers to Discharge: No Barriers Identified   Patient Goals and CMS Choice    Home/self care   Expected Discharge Plan and Services    Home/self care    Expected Discharge Date: 04/16/24                Prior Living Arrangements/Services   Lives with:: Significant Other (AND FIANCE' BROTHER, LARRY) Patient language and need for interpreter reviewed:: No Do you feel safe going back to the place where you live?: Yes      Need for Family Participation in Patient Care: Yes (Comment) Care giver support system in place?: Yes (comment) Current home services: DME ([employer] AND POWERED CHAIR) Criminal Activity/Legal Involvement Pertinent to Current Situation/Hospitalization: No - Comment as needed  Activities of Daily Living   ADL Screening (condition at time of admission) Independently performs ADLs?: Yes (appropriate for developmental age) Does the patient have a NEW difficulty with bathing/dressing/toileting/self-feeding that is expected to last >3 days?: No Does the patient have a NEW difficulty with getting in/out of bed, walking, or climbing stairs that is expected to last >3 days?: Yes (Initiates electronic notice to provider for possible PT consult) Does the patient have a NEW difficulty with communication that is expected to last >3 days?: No Is the patient deaf or have difficulty hearing?: No Does the patient have difficulty seeing, even when wearing  glasses/contacts?: No Does the patient have difficulty concentrating, remembering, or making decisions?: No  Permission Sought/Granted Permission sought to share information with : Case Manager, Family Supports Permission granted to share information with : Yes, Verbal Permission Granted  Share Information with NAME: Rama Lightfoot and Ubaldo Breslow     Permission granted to share info w Relationship: Fiance and fiance's brother  Permission granted to share info w Contact Information: yes  Emotional Assessment Appearance:: Appears older than stated age Attitude/Demeanor/Rapport: Engaged Affect (typically observed): Calm   Alcohol / Substance Use:  (Per patient last used CBS 2 weeks ago)    Admission diagnosis:  Eye pain [H57.10] Intractable episodic headache, unspecified headache type [R51.9] Ocular pain, right eye [H57.11] Patient Active Problem List   Diagnosis Date Noted   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   Steroid-induced hyperglycemia 04/15/2024   Intractable episodic headache 04/13/2024   Ocular pain, right eye 04/12/2024   Bipolar disorder (manic depression) (HCC)    Eye pain 04/11/2024   Sepsis due to pneumonia (HCC) 03/15/2024   Human metapneumovirus pneumonia 03/15/2024   Weakness 10/16/2023   Flu 10/16/2023   SIRS (systemic inflammatory response syndrome) (HCC) 10/15/2023   Elevated serum creatinine 10/15/2023   Myalgia 10/15/2023   Elevated LFTs 10/15/2023   Acute idiopathic pericarditis 03/09/2023   Acute pericarditis 03/08/2023   Sepsis (HCC) 03/06/2023   Chest pain 03/06/2023   CKD (chronic kidney disease), stage II 01/22/2023   Diabetes mellitus without complication (HCC) 01/22/2023   Overactive bladder 01/22/2023   High risk medication  use 12/12/2022   Sjogren's syndrome (HCC) 05/27/2022   Menorrhagia with irregular cycle    Akathisia 10/24/2021   At risk for prolonged QT interval syndrome 09/12/2021   Prolonged Q-T interval on ECG 08/29/2021    Insomnia 07/20/2021   Bipolar disorder, in full remission, most recent episode mixed (HCC) 07/20/2021   GAD (generalized anxiety disorder) 05/09/2021   Opioid dependence with opioid-induced disorder (HCC) 12/20/2020   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   Carpal tunnel syndrome, left 07/26/2019   Cubital tunnel syndrome on left 07/26/2019   Umbilical hernia without obstruction and without gangrene 06/17/2019   Lumbar spondylosis 06/02/2019   Goals of care, counseling/discussion 05/18/2019   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   Chronic pain 03/23/2019   Systemic lupus erythematosus (SLE) in adult [facility]) 03/23/2019   Hypertension 03/23/2019   Mass of left kidney 03/23/2019   Nausea without vomiting 03/23/2019   Hyperlipidemia 03/23/2019   Osteoarthritis 03/23/2019   Trigeminal neuralgia 03/23/2019   Nephrolithiasis 03/23/2019   Migraine 03/23/2019   OSA on CPAP 03/23/2019   Fibrocystic breast 03/23/2019   Heart murmur 03/23/2019   Major depressive disorder, recurrent (HCC) 03/18/2017   Morbid obesity (HCC) 03/18/2017   Neuropathic pain 11/19/2016   DLBCL (diffuse large B cell lymphoma) (HCC) 01/13/2015   Lumbar disc disease with radiculopathy 07/26/2013   S/P liver transplant (HCC) 07/26/2013   Other specified disorders of kidney and ureter 12/28/2012   Facial nerve disorder 06/29/2012   Low back pain 12/26/2010   PCP:  Gareth Mliss FALCON, FNP Pharmacy:   [facility] - [address] [place] - [address] [place] [address] Phone: [phone] Fax: [phone]  [facility] [address] [place], [place] - [address] [place] [address] Phone: [phone] Fax: [phone]     Social Drivers of Health (SDOH) Social History: SDOH Screenings   Food Insecurity: No Food Insecurity (04/12/2024)  Housing: Low Risk  (04/12/2024)  Transportation Needs: No Transportation Needs (04/12/2024)  Utilities: Not  At Risk (04/12/2024)  Alcohol Screen: Low Risk  (10/07/2023)  Depression (PHQ2-9): High Risk (04/01/2024)  Financial Resource Strain: Low Risk  (04/01/2024)  Physical Activity: Inactive (04/01/2024)  Social Connections: Unknown (04/12/2024)  Stress: Stress Concern Present (04/01/2024)  Tobacco Use: Low Risk  (04/14/2024)  Health Literacy: Adequate Health Literacy (04/01/2024)   SDOH Interventions:     Readmission Risk Interventions    10/16/2023    1:03 PM 03/08/2023    3:07 PM  Readmission Risk Prevention Plan  Transportation Screening Complete Complete  PCP or Specialist Appt within 3-5 Days Complete Complete  HRI or Home Care Consult  Complete  Social Work Consult for Recovery Care Planning/Counseling Complete Complete  Palliative Care Screening Not Applicable Complete  Medication Review Oceanographer) Complete Complete

## 2024-04-16 NOTE — Plan of Care (Signed)
  Problem: Education: Goal: Ability to describe self-care measures that may prevent or decrease complications (Diabetes Survival Skills Education) will improve Outcome: Progressing   Problem: Coping: Goal: Ability to adjust to condition or change in health will improve Outcome: Progressing   Problem: Fluid Volume: Goal: Ability to maintain a balanced intake and output will improve Outcome: Progressing   Problem: Health Behavior/Discharge Planning: Goal: Ability to identify and utilize available resources and services will improve Outcome: Progressing Goal: Ability to manage health-related needs will improve Outcome: Progressing   Problem: Metabolic: Goal: Ability to maintain appropriate glucose levels will improve Outcome: Progressing   Problem: Nutritional: Goal: Maintenance of adequate nutrition will improve Outcome: Progressing Goal: Progress toward achieving an optimal weight will improve Outcome: Progressing   Problem: Skin Integrity: Goal: Risk for impaired skin integrity will decrease Outcome: Progressing   Problem: Tissue Perfusion: Goal: Adequacy of tissue perfusion will improve Outcome: Progressing   Problem: Education: Goal: Knowledge of General Education information will improve Description: Including pain rating scale, medication(s)/side effects and non-pharmacologic comfort measures Outcome: Progressing

## 2024-04-16 NOTE — TOC Transition Note (Signed)
 Transition of Care [facility]) - Discharge Note   Patient Details  Name: Alison [name] MRN: [id number] Date of Birth: 08-16-1976  Transition of Care [facility]) CM/SW Contact:  Elouise LULLA Capri, RN 04/16/2024, 4:48 PM   Clinical Narrative:     Discharge orders noted. Private transportation arranged. No identified needs.   Final next level of care: Home/Self Care Barriers to Discharge: No Barriers Identified   Patient Goals and CMS Choice    Home/self care  Discharge Placement    Home/self care         Discharge Plan and Services Additional resources added to the After Visit Summary for    Social Drivers of Health (SDOH) Interventions SDOH Screenings   Food Insecurity: No Food Insecurity (04/12/2024)  Housing: Low Risk  (04/12/2024)  Transportation Needs: No Transportation Needs (04/12/2024)  Utilities: Not At Risk (04/12/2024)  Alcohol Screen: Low Risk  (10/07/2023)  Depression (PHQ2-9): High Risk (04/01/2024)  Financial Resource Strain: Low Risk  (04/01/2024)  Physical Activity: Inactive (04/01/2024)  Social Connections: Unknown (04/12/2024)  Stress: Stress Concern Present (04/01/2024)  Tobacco Use: Low Risk  (04/14/2024)  Health Literacy: Adequate Health Literacy (04/01/2024)     Readmission Risk Interventions    10/16/2023    1:03 PM 03/08/2023    3:07 PM  Readmission Risk Prevention Plan  Transportation Screening Complete Complete  PCP or Specialist Appt within 3-5 Days Complete Complete  HRI or Home Care Consult  Complete  Social Work Consult for Recovery Care Planning/Counseling Complete Complete  Palliative Care Screening Not Applicable Complete  Medication Review Oceanographer) Complete Complete

## 2024-04-17 LAB — TYPE AND SCREEN
ABO/RH(D): O POS
Antibody Screen: POSITIVE
DAT, IgG: NEGATIVE
DAT, complement: POSITIVE
Unit division: 0
Unit division: 0

## 2024-04-17 LAB — BPAM RBC
Blood Product Expiration Date: 202508032359
Blood Product Expiration Date: 202508032359
Unit Type and Rh: 5100
Unit Type and Rh: 5100

## 2024-04-19 ENCOUNTER — Ambulatory Visit: Admitting: Physical Therapy

## 2024-04-19 ENCOUNTER — Encounter

## 2024-04-19 MED ORDER — RELION GLUCOSE TEST STRIPS VI STRP: ORAL_STRIP | 12 refills | Status: DC

## 2024-04-21 ENCOUNTER — Ambulatory Visit: Admitting: Physical Therapy

## 2024-04-21 ENCOUNTER — Encounter: Payer: Self-pay | Admitting: "Endocrinology

## 2024-04-21 ENCOUNTER — Ambulatory Visit: Admitting: "Endocrinology

## 2024-04-21 ENCOUNTER — Encounter

## 2024-04-21 VITALS — BP 122/80 | HR 85 | Ht 66.0 in | Wt 249.0 lb

## 2024-04-21 DIAGNOSIS — Z794 Long term (current) use of insulin: Secondary | ICD-10-CM | POA: Diagnosis not present

## 2024-04-21 DIAGNOSIS — Z7984 Long term (current) use of oral hypoglycemic drugs: Secondary | ICD-10-CM

## 2024-04-21 DIAGNOSIS — Z7985 Long-term (current) use of injectable non-insulin antidiabetic drugs: Secondary | ICD-10-CM | POA: Diagnosis not present

## 2024-04-21 DIAGNOSIS — M79603 Pain in arm, unspecified: Secondary | ICD-10-CM | POA: Diagnosis not present

## 2024-04-21 DIAGNOSIS — M79606 Pain in leg, unspecified: Secondary | ICD-10-CM | POA: Diagnosis not present

## 2024-04-21 DIAGNOSIS — M79669 Pain in unspecified lower leg: Secondary | ICD-10-CM | POA: Diagnosis not present

## 2024-04-21 DIAGNOSIS — F3189 Other bipolar disorder: Secondary | ICD-10-CM | POA: Diagnosis not present

## 2024-04-21 DIAGNOSIS — G893 Neoplasm related pain (acute) (chronic): Secondary | ICD-10-CM | POA: Diagnosis not present

## 2024-04-21 DIAGNOSIS — E782 Mixed hyperlipidemia: Secondary | ICD-10-CM

## 2024-04-21 DIAGNOSIS — G894 Chronic pain syndrome: Secondary | ICD-10-CM | POA: Diagnosis not present

## 2024-04-21 DIAGNOSIS — Z79891 Long term (current) use of opiate analgesic: Secondary | ICD-10-CM | POA: Diagnosis not present

## 2024-04-21 DIAGNOSIS — M5442 Lumbago with sciatica, left side: Secondary | ICD-10-CM | POA: Diagnosis not present

## 2024-04-21 DIAGNOSIS — E1165 Type 2 diabetes mellitus with hyperglycemia: Secondary | ICD-10-CM | POA: Diagnosis not present

## 2024-04-21 DIAGNOSIS — M5414 Radiculopathy, thoracic region: Secondary | ICD-10-CM | POA: Diagnosis not present

## 2024-04-21 DIAGNOSIS — M25519 Pain in unspecified shoulder: Secondary | ICD-10-CM | POA: Diagnosis not present

## 2024-04-21 DIAGNOSIS — I10 Essential (primary) hypertension: Secondary | ICD-10-CM | POA: Diagnosis not present

## 2024-04-21 MED ORDER — SEMAGLUTIDE (2 MG/DOSE) 8 MG/3ML ~~LOC~~ SOPN: 2.0000 mg | PEN_INJECTOR | SUBCUTANEOUS | 1 refills | Status: DC

## 2024-04-21 NOTE — Progress Notes (Signed)
 Outpatient Endocrinology Note Alison Birmingham, MD  04/21/24   Alison [name] 15-Dec-1975 [id number]  Referring Provider: Gareth Mliss FALCON, FNP Primary Care Provider: Gareth Mliss FALCON, FNP Reason for consultation: Subjective   Assessment & Plan  Diagnoses and all orders for this visit:  Uncontrolled type 2 diabetes mellitus with hyperglycemia (HCC)  Long-term insulin  use (HCC)  Long term (current) use of oral hypoglycemic drugs  Long-term (current) use of injectable non-insulin  antidiabetic drugs  Mixed hypercholesterolemia and hypertriglyceridemia  Other orders -     Semaglutide , 2 MG/DOSE, 8 MG/3ML SOPN; Inject 2 mg as directed once a week.    Diabetes Type II complicated by neuropathy, No results found for: GFR Hba1c goal less than 7, current Hba1c is  Lab Results  Component Value Date   HGBA1C 9.7 (H) 04/01/2024   Will recommend the following: Reinforced compliance  Take lantus  38 units at bedtime  Novolog  8 units three times a day 15 min before meals + correction scale  Novolog  correction scale: Use in addition to your meal time insulin  based on blood sugars as follows: 151 - 175: 1 unit 176 - 200: 2 units 201 - 225: 3 units 226 - 250: 4 units 251 - 275: 5 units 276 - 300: 6 units 301 - 325: 7 units 326 - 350: 8 units 351 - 375: 9 units 376 - 400: 10 units  Continue Metformin  1000 mg bid Continue Glipizide  XL 10 mg BID Ozempic  1 mg weekly ->2 mg after 4 weeks  Ordered DM education Ordered DexCom, not covered [employer] 3 + Discussed that its unsafe not to check BG before meals, patient understands and greed  Ran out of Trulicity  previously, started on ozempic  and ran out 2 weeks ago   No known contraindications/side effects to any of above medications Glucagon discussed and prescribed with refills on 11/2023  -Last LD and Tg are as follows: Lab Results  Component Value Date   LDLCALC 57 04/01/2024    Lab Results  Component Value Date    TRIG 329 (H) 04/01/2024   -On rosuvastatin  10 mg QD -Follow low fat diet and exercise   -Blood pressure goal <140/90 - Microalbumin/creatinine goal is < 30 -Last MA/Cr is as follows: Lab Results  Component Value Date   MICROALBUR 41.5 11/13/2023   -on ACE/ARB lisinopril  20 mg every day  -diet changes including salt restriction -limit eating outside -counseled BP targets per standards of diabetes care -uncontrolled blood pressure can lead to retinopathy, nephropathy and cardiovascular and atherosclerotic heart disease  Reviewed and counseled on: -A1C target -Blood sugar targets -Complications of uncontrolled diabetes  -Checking blood sugar before meals and bedtime and bring log next visit -All medications with mechanism of action and side effects -Hypoglycemia management: rule of 15's, Glucagon Emergency Kit and medical alert ID -low-carb low-fat plate-method diet -At least 20 minutes of physical activity per day -Annual dilated retinal eye exam and foot exam -compliance and follow up needs -follow up as scheduled or earlier if problem gets worse  Call if blood sugar is less than 70 or consistently above 250    Take a 15 gm snack of carbohydrate at bedtime before you go to sleep if your blood sugar is less than 100.    If you are going to fast after midnight for a test or procedure, ask your physician for instructions on how to reduce/decrease your insulin  dose.    Call if blood sugar is less than 70 or consistently  above 250  -Treating a low sugar by rule of 15  (15 gms of sugar every 15 min until sugar is more than 70) If you feel your sugar is low, test your sugar to be sure If your sugar is low (less than 70), then take 15 grams of a fast acting Carbohydrate (3-4 glucose tablets or glucose gel or 4 ounces of juice or regular soda) Recheck your sugar 15 min after treating low to make sure it is more than 70 If sugar is still less than 70, treat again with 15 grams of  carbohydrate          Don't drive the hour of hypoglycemia  If unconscious/unable to eat or drink by mouth, use glucagon injection or nasal spray baqsimi and call 911. Can repeat again in 15 min if still unconscious.  Return in about 5 weeks (around 05/26/2024).   I have reviewed current medications, nurse's notes, allergies, vital signs, past medical and surgical history, family medical history, and social history for this encounter. Counseled patient on symptoms, examination findings, lab findings, imaging results, treatment decisions and monitoring and prognosis. The patient understood the recommendations and agrees with the treatment plan. All questions regarding treatment plan were fully answered.  Alison Birmingham, MD  04/21/24   History of Present Illness Alison [name] is a 48 y.o. year old female who presents for follow up of Type II diabetes mellitus.  Alison [name] was first diagnosed around 2020.   Diabetes education +  Home diabetes regimen: Lantus  40 units at bedtime Novolog  Sliding scale 2:50>150 right before break fast only  Metformin  1000 mg bid Glipizide  XL 10 gm bid  Ozempic  1 mg weekly: well tolerated without issues   Ran out of Trulicity  for 2 mo   COMPLICATIONS -  MI/Stroke -  retinopathy +  neuropathy -  nephropathy  BLOOD SUGAR DATA CGM interpretation: At today's visit, we reviewed her CGM downloads. The full report is scanned in the media. Reviewing the CGM trends, BG are elevated across the day (patient reported 2 low blood sugars fasting past 2 mornings).  Physical Exam  BP 122/80   Pulse 85   Ht 5' 6 (1.676 m)   Wt 249 lb (112.9 kg)   LMP 10/16/2021 (Exact Date) Comment: Seen by OB GYN on 10/16/21 for 6 days of excessive bleeding as a post menapause  SpO2 98%   BMI 40.19 kg/m    Constitutional: well developed, well nourished Head: normocephalic, atraumatic Eyes: sclera anicteric, no redness Neck: supple Lungs: normal respiratory  effort Neurology: alert and oriented Skin: dry, no appreciable rashes Musculoskeletal: no appreciable defects Psychiatric: normal mood and affect Diabetic Foot Exam - Simple   No data filed      Current Medications Patient's Medications  New Prescriptions   SEMAGLUTIDE , 2 MG/DOSE, 8 MG/3ML SOPN    Inject 2 mg as directed once a week.  Previous Medications   ACCU-CHEK SOFTCLIX LANCETS LANCETS    2 (two) times daily.   ALBUTEROL  (VENTOLIN  HFA) 108 (90 BASE) MCG/ACT INHALER    Inhale 2 puffs into the lungs every 6 (six) hours as needed for wheezing or shortness of breath.   BACLOFEN  (LIORESAL ) 10 MG TABLET    Take 1 tablet by mouth 2 (two) times daily.   BENZTROPINE  (COGENTIN ) 1 MG TABLET    TAKE 1 TABLET BY MOUTH ONCE DAILY AS NEEDED FOR TREMORS   CONTINUOUS GLUCOSE SENSOR (FREESTYLE LIBRE 3 PLUS SENSOR) MISC    CHANGE  SENSOR EVERY 15 DAYS   CRANBERRY-VITAMIN C-PROBIOTIC (AZO CRANBERRY PO)    Take by mouth.   FLUTICASONE  (FLONASE ) 50 MCG/ACT NASAL SPRAY    Place into the nose.   GLIPIZIDE  (GLUCOTROL  XL) 10 MG 24 HR TABLET    TAKE 1 TABLET BY MOUTH IN THE MORNING AND AT BEDTIME   GLUCOSE BLOOD (RELION GLUCOSE TEST STRIPS) TEST STRIP    Use as instructed   INSULIN  GLARGINE (LANTUS  SOLOSTAR) 100 UNIT/ML SOLOSTAR PEN    Inject 40 Units into the skin 2 (two) times daily.   INSULIN  PEN NEEDLE 32G X 6 MM MISC    1 each by Does not apply route as needed.   LISINOPRIL  (ZESTRIL ) 20 MG TABLET    TAKE 1 TABLET BY MOUTH ONCE DAILY *PATIENT WILL NEED TO SCHEDULE AN APPOINTMENT FOR ADDITIONAL REFILLS*   LURASIDONE  (LATUDA ) 20 MG TABS TABLET    TAKE 1 TABLET BY MOUTH ONCE DAILY WITH SUPPER *REFILL REQUEST*   MAGNESIUM  OXIDE (MAG-OX) 400 MG TABLET    Take 1,000 mg by mouth 2 (two) times daily.   MELATONIN 10 MG TABS    Take 10 mg by mouth at bedtime.   METFORMIN  (GLUCOPHAGE ) 1000 MG TABLET    TAKE 1 TABLET BY MOUTH TWICE DAILY WITH MEALS *PATIENT WILL NEED TO SCHEDULE APPOINTMENT FOR ADDITIONAL REFILLS*    MIRABEGRON  ER (MYRBETRIQ ) 50 MG TB24 TABLET    Take 1 tablet (50 mg total) by mouth daily.   MIRTAZAPINE  (REMERON ) 15 MG TABLET    TAKE 1 TABLET BY MOUTH EVERY DAY AT BEDTIME *REFILL REQUEST*   NALOXONE  (NARCAN ) NASAL SPRAY 4 MG/0.1 ML    Place 0.4 mg into the nose once.   NOVOLOG  FLEXPEN 100 UNIT/ML FLEXPEN    Inject 40 Units into the skin 3 (three) times daily with meals. Hold if not eating at least half of a full meal.   ONDANSETRON  (ZOFRAN ) 4 MG TABLET    Take 1 tablet (4 mg total) by mouth every 6 (six) hours as needed for nausea.   OXYCODONE  (OXY IR/ROXICODONE ) 5 MG IMMEDIATE RELEASE TABLET    Take 5 mg by mouth 3 (three) times daily as needed.   PANTOPRAZOLE  (PROTONIX ) 40 MG TABLET    Take 1 tablet (40 mg total) by mouth daily. Recommend using while on Prednisone .   PRECISION QID TEST TEST STRIP       PREDNISONE  (DELTASONE ) 50 MG TABLET    Take one tablet (50 mg) by mouth once daily until follow up with Neurology.   PREGABALIN  (LYRICA ) 200 MG CAPSULE    Take 1 capsule (200 mg total) by mouth 2 (two) times daily.   ROSUVASTATIN  (CRESTOR ) 10 MG TABLET    Take 1 tablet (10 mg total) by mouth daily.   TACROLIMUS  (PROGRAF ) 1 MG CAPSULE    Take 1 capsule by mouth every morning.   TYLENOL  PM EXTRA STRENGTH 1000-50 MG/30ML LIQD    Take by mouth.   UBRELVY  50 MG TABS    Take 1 tablet by mouth daily as needed.   VENLAFAXINE  XR (EFFEXOR -XR) 37.5 MG 24 HR CAPSULE    Take 37.5 mg by mouth every morning.   VENLAFAXINE  XR (EFFEXOR -XR) 75 MG 24 HR CAPSULE    Take 1 capsule (75 mg total) by mouth daily with breakfast. Take along with 37.5 mg daily  Modified Medications   No medications on file  Discontinued Medications   SEMAGLUTIDE , 1 MG/DOSE, 4 MG/3ML SOPN    Inject 1 mg  into the skin once a week.    Allergies Allergies  Allergen Reactions   Penicillin G Itching, Rash, Anaphylaxis and Palpitations   Lamictal  [Lamotrigine ] Rash   Tape    Carbamazepine Other (See Comments)    Medication  interaction-prograf     Hydrocodone-Acetaminophen  Itching   Naproxen Itching   Penicillins    Vancomycin  Rash    Macular/blotchy rash at infusion site    Past Medical History Past Medical History:  Diagnosis Date   Abnormal uterine bleeding    Allergy    Anxiety    Arthritis    Bipolar disorder (manic depression) (HCC)    Cancer (HCC) Diffuse B-Cell Lymphoma   2015   Chest pain    a. 09/2021 MV: Low risk without ischemia or infarct.  Normal EF.   Chronic kidney failure    Chronic renal disease, stage III (HCC)    Diabetes mellitus without complication (HCC)    DLBCL (diffuse large B cell lymphoma) (HCC) 2015   Right axillary lymph node resected and chemo tx's.   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 02/2023 Echo: EF 55-60%, no rwma, nl RV size/fxn. Mildly dil LA.   Personality disorder (HCC)    Pineal gland cyst    Post herpetic neuralgia    PTSD (post-traumatic stress disorder)    RBBB    Renal disorder    S/P liver transplant (HCC) 1993   Sleep apnea    has not recieved cpap yet-other one was recalled    Past Surgical History Past Surgical History:  Procedure Laterality Date   ARTERY BIOPSY Right 04/14/2024   Procedure: BIOPSY TEMPORAL ARTERY;  Surgeon: Marea Selinda RAMAN, MD;  Location: [facility] ORS;  Service: General;  Laterality: Right;   BONE MARROW BIOPSY  01/13/2015   BREAST BIOPSY Right 12/2014   US  Axilla Bx, Positive for Lymphoma   BREAST BIOPSY Right 2011   benign   BREAST BIOPSY Right 2023   Stereo bx, X-clip, Fat Necrosis   BREAST BIOPSY Right  01/20/2024   US  RT BREAST BX W LOC DEV 1ST LESION IMG BX SPEC US  GUIDE 01/20/2024 [facility]   BREAST SURGERY Right 2016   Lymphoma, lymph node removal. pt then got a seroma and had to do more surgery   CHOLECYSTECTOMY     COLONOSCOPY     COLONOSCOPY WITH PROPOFOL  N/A 02/28/2022   Procedure: COLONOSCOPY WITH PROPOFOL ;  Surgeon: Jinny Carmine, MD;  Location: [facility];  Service: Endoscopy;  Laterality: N/A;   ESOPHAGOGASTRODUODENOSCOPY     ESOPHAGOGASTRODUODENOSCOPY (EGD) WITH PROPOFOL  N/A 01/09/2021   Procedure: ESOPHAGOGASTRODUODENOSCOPY (EGD) WITH PROPOFOL ;  Surgeon: Jinny Carmine, MD;  Location: [facility] ENDOSCOPY;  Service: Endoscopy;  Laterality: N/A;   HERNIA REPAIR     x4-all from liver transplant   HYSTEROSCOPY WITH D & C N/A 11/13/2021   Procedure: DILATATION AND CURETTAGE /HYSTEROSCOPY;  Surgeon: Victor Claudell SAUNDERS, MD;  Location: [facility] OR[facility];  Service:  Gynecology;  Laterality: N/A;   infusaport     LIVER TRANSPLANT  12/17/1991   LUMBAR PUNCTURE     PORT A CATH INJECTION ([facility])     RENAL BIOPSY     tumor removal  2015   cancer right side    Family History family history includes Alcohol abuse in her brother and maternal uncle; Anxiety disorder in her mother; Arthritis in her mother; Bipolar disorder in her mother; COPD in her maternal grandmother; Cancer in her maternal aunt, maternal grandfather, and maternal uncle; Cancer - Other in her mother; Depression in her brother and mother; Diabetes in her father; Heart disease in her father and mother; Hypertension in her brother, father, and mother; Lupus in her paternal grandmother; Parkinson's disease in her maternal grandmother.  Social History Social History   Socioeconomic History   Marital status: Single    Spouse name: Not on file   Number of children: 0   Years of education: 9   Highest education level: GED or equivalent  Occupational History   Occupation: disabled  Tobacco Use   Smoking status: Never     Passive exposure: Past   Smokeless tobacco: Never   Tobacco comments:    Occasional CBD oil  Vaping Use   Vaping status: Never Used  Substance and Sexual Activity   Alcohol use: Not on file   Drug use: Not Currently    Comment: pain managment last used in early april   Sexual activity: Not Currently  Other Topics Concern   Not on file  Social History Narrative   Not on file   Social Drivers of Health   Financial Resource Strain: Low Risk  (04/01/2024)   Overall Financial Resource Strain (CARDIA)    Difficulty of Paying Living Expenses: Not very hard  Food Insecurity: No Food Insecurity (04/12/2024)   Hunger Vital Sign    Worried About Running Out of Food in the Last Year: Never true    Ran Out of Food in the Last Year: Never true  Transportation Needs: No Transportation Needs (04/12/2024)   PRAPARE - Administrator, Civil Service (Medical): No    Lack of Transportation (Non-Medical): No  Physical Activity: Inactive (04/01/2024)   Exercise Vital Sign    Days of Exercise per Week: 0 days    Minutes of Exercise per Session: 0 min  Stress: Stress Concern Present (04/01/2024)   [employer] of Occupational Health - Occupational Stress Questionnaire    Feeling of Stress: To some extent  Social Connections: Unknown (04/12/2024)   Social Connection and Isolation Panel    Frequency of Communication with Friends and Family: Patient declined    Frequency of Social Gatherings with Friends and Family: Patient declined    Attends Religious Services: Patient declined    Active Member of Clubs or Organizations: Patient declined    Attends Banker Meetings: Patient declined    Marital Status: Living with partner  Intimate Partner Violence: Not At Risk (04/12/2024)   Humiliation, Afraid, Rape, and Kick questionnaire    Fear of Current or Ex-Partner: No    Emotionally Abused: No    Physically Abused: No    Sexually Abused: No    Lab Results  Component Value Date    HGBA1C 9.7 (H) 04/01/2024   HGBA1C 11.8 (H) 11/13/2023   HGBA1C 10.8 (H) 10/16/2023   Lab Results  Component Value Date   CHOL 122 04/01/2024   Lab Results  Component Value Date  HDL 30 (L) 04/01/2024   Lab Results  Component Value Date   LDLCALC 57 04/01/2024   Lab Results  Component Value Date   TRIG 329 (H) 04/01/2024   Lab Results  Component Value Date   CHOLHDL 4.1 04/01/2024   Lab Results  Component Value Date   CREATININE 1.12 (H) 04/13/2024   No results found for: GFR Lab Results  Component Value Date   MICROALBUR 41.5 11/13/2023      Component Value Date/Time   NA 132 (L) 04/13/2024 0406   K 4.5 04/13/2024 0406   CL 95 (L) 04/13/2024 0406   CO2 23 04/13/2024 0406   GLUCOSE 436 (H) 04/14/2024 2237   BUN 27 (H) 04/13/2024 0406   CREATININE 1.12 (H) 04/13/2024 0406   CREATININE 1.14 (H) 04/01/2024 1043   CALCIUM  9.3 04/13/2024 0406   CALCIUM  7.6 (L) 06/27/2022 1448   PROT 7.1 04/12/2024 0500   ALBUMIN 3.5 04/12/2024 0500   AST 19 04/12/2024 0500   AST 22 03/03/2024 0958   ALT 15 04/12/2024 0500   ALT 19 03/03/2024 0958   ALKPHOS 67 04/12/2024 0500   BILITOT 1.0 04/12/2024 0500   BILITOT 0.5 03/03/2024 0958   GFRNONAA 60 (L) 04/13/2024 0406   GFRNONAA >60 03/03/2024 0958   GFRNONAA 64 12/11/2020 0000   GFRAA 74 12/11/2020 0000      Latest Ref Rng & Units 04/14/2024   10:37 PM 04/13/2024    4:06 AM 04/12/2024    5:00 AM  BMP  Glucose 70 - 99 mg/dL 563  662  757   BUN 6 - 20 mg/dL  27  14   Creatinine 9.55 - 1.00 mg/dL  8.87  9.11   Sodium 864 - 145 mmol/L  132  136   Potassium 3.5 - 5.1 mmol/L  4.5  4.5   Chloride 98 - 111 mmol/L  95  102   CO2 22 - 32 mmol/L  23  24   Calcium  8.9 - 10.3 mg/dL  9.3  9.3        Component Value Date/Time   WBC 9.3 04/13/2024 0406   RBC 4.15 04/13/2024 0406   HGB 11.7 (L) 04/13/2024 0406   HGB 11.9 (L) 03/03/2024 0945   HGB 14.6 10/16/2021 1122   HCT 35.1 (L) 04/13/2024 0406   HCT 44.0 10/16/2021 1122    PLT 185 04/13/2024 0406   PLT 223 03/03/2024 0945   MCV 84.6 04/13/2024 0406   MCV 88 10/16/2021 1122   MCH 28.2 04/13/2024 0406   MCHC 33.3 04/13/2024 0406   RDW 14.5 04/13/2024 0406   RDW 13.7 10/16/2021 1122   LYMPHSABS 1.8 03/14/2024 2145   LYMPHSABS 2.0 10/16/2021 1122   MONOABS 0.7 03/14/2024 2145   EOSABS 72 04/01/2024 1043   EOSABS 0.4 10/16/2021 1122   BASOSABS 54 04/01/2024 1043   BASOSABS 0.1 10/16/2021 1122     Parts of this note may have been dictated using voice recognition software. There may be variances in spelling and vocabulary which are unintentional. Not all errors are proofread. Please notify the dino if any discrepancies are noted or if the meaning of any statement is not clear.

## 2024-04-21 NOTE — Patient Instructions (Addendum)
 Will recommend the following: Lantus  38 units at bedtime  Novolog  8 units three times a day 15 min before meals + correction scale  Novolog  correction scale: Use in addition to your meal time insulin  based on blood sugars as follows: 151 - 175: 1 unit 176 - 200: 2 units 201 - 225: 3 units 226 - 250: 4 units 251 - 275: 5 units 276 - 300: 6 units 301 - 325: 7 units 326 - 350: 8 units 351 - 375: 9 units 376 - 400: 10 units  Continue Metformin  1000 mg twice a day Continue Glipizide  XL 10 mg twice a day Ozempic  1 mg weekly ->2 mg after 4 weeks  ____________   Goals of DM therapy:  Morning Fasting blood sugar: 80-140  Blood sugar before meals: 80-140 Bed time blood sugar: 100-150  A1C <7%, limited only by hypoglycemia  1.Diabetes medications and their side effects discussed, including hypoglycemia    2. Check blood glucose:  a) Always check blood sugars before driving. Please see below (under hypoglycemia) on how to manage b) Check a minimum of 3 times/day or more as needed when having symptoms of hypoglycemia.   c) Try to check blood glucose before sleeping/in the middle of the night to ensure that it is remaining stable and not dropping less than 100 d) Check blood glucose more often if sick  3. Diet: a) 3 meals per day schedule b: Restrict carbs to 60-70 grams (4 servings) per meal c) Colorful vegetables - 3 servings a day, and low sugar fruit 2 servings/day Plate control method: 1/4 plate protein, 1/4 starch, 1/2 green, yellow, or red vegetables d) Avoid carbohydrate snacks unless hypoglycemic episode, or increased physical activity  4. Regular exercise as tolerated, preferably 3 or more hours a week  5. Hypoglycemia: a)  Do not drive or operate machinery without first testing blood glucose to assure it is over 90 mg%, or if dizzy, lightheaded, not feeling normal, etc, or  if foot or leg is numb or weak. b)  If blood glucose less than 70, take four 5gm Glucose tabs or  15-30 gm Glucose gel.  Repeat every 15 min as needed until blood sugar is >100 mg/dl. If hypoglycemia persists then call 911.   6. Sick day management: a) Check blood glucose more often b) Continue usual therapy if blood sugars are elevated.   7. Contact the doctor immediately if blood glucose is frequently <60 mg/dl, or an episode of severe hypoglycemia occurs (where someone had to give you glucose/  glucagon or if you passed out from a low blood glucose), or if blood glucose is persistently >350 mg/dl, for further management  8. A change in level of physical activity or exercise and a change in diet may also affect your blood sugar. Check blood sugars more often and call if needed.  Instructions: 1. Bring glucose meter, blood glucose records on every visit for review 2. Continue to follow up with primary care physician and other providers for medical care 3. Yearly eye  and foot exam 4. Please get blood work done prior to the next appointment

## 2024-04-21 NOTE — Anesthesia Postprocedure Evaluation (Signed)
 Anesthesia Post Note  [name]  Procedure(s) Performed: BIOPSY TEMPORAL ARTERY (Right)  Patient location during evaluation: PACU Anesthesia Type: General Level of consciousness: awake and alert Pain management: pain level controlled Vital Signs Assessment: post-procedure vital signs reviewed and stable Respiratory status: spontaneous breathing, nonlabored ventilation, respiratory function stable and patient connected to nasal cannula oxygen Cardiovascular status: blood pressure returned to baseline and stable Postop Assessment: no apparent nausea or vomiting Anesthetic complications: no   No notable events documented.   Last Vitals:  Vitals:   04/16/24 0813 04/16/24 1646  BP: 132/74 (!) 160/87  Pulse: (!) 48 (!) 52  Resp: 18 19  Temp: 36.9 C 36.8 C  SpO2: 96% 97%    Last Pain:  Vitals:   04/16/24 0845  TempSrc:   PainSc: 0-No pain                 Prentice Murphy

## 2024-04-22 ENCOUNTER — Other Ambulatory Visit: Payer: Self-pay

## 2024-04-22 NOTE — Patient Instructions (Addendum)
 Visit Information  Alison [name] was given information about Medicaid Managed Care team care coordination services as a part of their Healthy [facility] Medicaid benefit. Alison [name] verbally consented to engagement with the [facility] Managed Care team.   If you are experiencing a medical emergency, please call 911 or report to your local emergency department or urgent care.   If you have a non-emergency medical problem during routine business hours, please contact your provider's office and ask to speak with a nurse.   For questions related to your Healthy [facility] health plan, please call: [phone] or visit the homepage here: [email or web address]  If you would like to schedule transportation through your Healthy [facility] plan, please call the following number at least 2 days in advance of your appointment: [phone]  For information about your ride after you set it up, call Ride Assist at [phone]. Use this number to activate a Will Call pickup, or if your transportation is late for a scheduled pickup. Use this number, too, if you need to make a change or cancel a previously scheduled reservation.  If you need transportation services right away, call [phone]. The after-hours call center is staffed 24 hours to handle ride assistance and urgent reservation requests (including discharges) 365 days a year. Urgent trips include sick visits, hospital discharge requests and life-sustaining treatment.  Call the [facility] Line at [phone], at any time, 24 hours a day, 7 days a week. If you are in danger or need immediate medical attention call 911.  If you would like help to quit smoking, call 1-800-QUIT-NOW (248-376-1608) OR Espaol: 1-855-Djelo-Ya (8-144-664-6430) o para ms informacin haga clic aqu or Text READY to 799-599 to register via text  Alison [name] - following are the goals we discussed in your visit today:    Goals Addressed             This Visit's Progress    VBCI RN Care Plan - DM Insulin  Dependent       Problems:  Chronic Disease Management support and education needs related to DMII  Goal: Over the next 90 days the Patient will attend all scheduled medical appointments: PPC and specialist as evidenced by chart review, no missed appointments        continue to work with RN Care Manager and/or Social Worker to address care management and care coordination needs related to DMII as evidenced by adherence to care management team scheduled appointments     take all medications exactly as prescribed and will call provider for medication related questions as evidenced by verbalizing compliance with all medications, specifically use of basal and short acting insulin     verbalize basic understanding of DMII disease process and self health management plan as evidenced by taking insulin  and medications as prescribed, checking BG at least fasting and before meals, then as advised by Endo at appointment 04/05/24, calling provider with questions or concerns, attending Endocrine appointment and any recommended classes related to DM, lifestyle modifications   Interventions:   Diabetes Interventions: Assessed patient's understanding of A1c goal: <7%   Currently 9.7 Provided education to patient about basic DM disease process - Discussed how DM affects other systems and importance of controlling BG Reviewed medications with patient and discussed importance of medication adherence Patient compliant with all meds Discussed plans with patient for ongoing care management follow up and provided patient with direct contact information for care management team patient will call for PCP and Vascular appointments Provided patient with written  educational materials related to hypo and hyperglycemia and importance of correct treatment; Patient able to verbalized correct treatment of hypoglycemia, getting glucose tabs as  discussed Advised patient, providing education and rationale, to check FBG and before meals and record, calling PCP for findings outside established parameters Screening for signs and symptoms of depression related to chronic disease state Seeing counselor, participating in hobbies and will be attending Board Game group this weekend.  Assessed social determinant of health barriers Lab Results  Component Value Date   HGBA1C 9.7 (H) 04/01/2024   Provided support and encouragement, patient now checking BG regularly, taking insulin  and motivated to make lifestyle changes to improve health and wellbeing. Discussed resources for healthy eating and carb counting including Carb Counting apps, Recipe apps for quick healthy meals, Rainbow Vegan YouTube since patient is vegan, meal prep to help patient make healthy choices throughout week, healthy snacks  Patient Self-Care Activities:  Attend all scheduled provider appointments Call pharmacy for medication refills 3-7 days in advance of running out of medications Call provider office for new concerns or questions  Take medications as prescribed   check blood sugar at prescribed times: At least FBG and before meals then as directed by endo at appointment 6/30 enter blood sugar readings and medication or insulin  into daily log take the blood sugar log to all doctor visits  Plan:  Telephone follow up appointment with care management team member scheduled for:  05/20/2024 at 3:00 pm             Please see education materials related to Diabetes provided by MyChart link.  Patient verbalizes understanding of instructions and care plan provided today and agrees to view in MyChart. Active MyChart status and patient understanding of how to access instructions and care plan via MyChart confirmed with patient.     Telephone follow up appointment with Managed Medicaid care management team member scheduled for: 05/20/2024 at 3:00 pm. Please make follow up  appointments with your PCP and Vascular Specialist.  Alison Duos, MSN, RN [facility]  [facility], [facility] RN Care Manager Direct Dial: [phone] Fax: [phone]   Diabetes Action Plan A diabetes action plan is a way for you to manage your symptoms of diabetes, also called diabetes mellitus. The plan is color-coded to guide you on what actions to take based on any symptoms you're having. If you have symptoms in the red zone, you need medical care right away. If you have symptoms in the yellow zone, your diabetes isn't under control, and you may need to make some changes. If you have symptoms in the green zone, you're doing well. Understanding diabetes can take time. Follow the treatment plan that you created with your health care provider. Know the target range for your blood sugar, also called glucose. Review your plan each time you visit your provider. The target range for my blood sugar level is __________________________ mg/dL. Red zone Get medical help right away if you have any of the following symptoms: A blood sugar test result that's below 54 mg/dL (3 mmol/L). A blood sugar test result that's at or above 240 mg/dL (86.6 mmol/L) for 2 days in a row along with: Extreme thirst and frequent peeing. Confusion or trouble thinking clearly. Moderate or large ketone levels in your pee (urine). Feeling tired or having no energy. Trouble breathing. Sickness or a fever for 2 or more days that's not getting better. These symptoms may be an emergency. Call 911 right away. Do not wait  to see if the symptoms will go away. Do not drive yourself to the hospital. If you have very low blood sugar, also called severe hypoglycemia, and you can't eat or drink, you may need glucagon. Make sure a family member or close friend knows how to check your blood sugar and how to give you glucagon. You may need to be treated in a hospital for this condition. Yellow zone If  you have any of the following symptoms, your diabetes isn't under control, and you may need to make some changes: A blood sugar test result that's at or above 240 mg/dL (86.6 mmol/L) for 2 days in a row. Blood sugar test results that are below 70 mg/dL (3.9 mmol/L). Other symptoms of hypoglycemia, such as: Shaking or feeling light-headed. Confusion or irritability. Feeling hungry. Having a fast heartbeat. If you have any yellow zone symptoms: Treat your hypoglycemia by eating or drinking 15 grams of a rapid-acting carbohydrate. Follow the 15:15 rule: Take 15 grams of a rapid-acting carbohydrate, such as: 1 tube of glucose gel. 4 glucose pills. 4 oz (120 mL) of fruit juice. 4 oz (120 mL) of regular (not diet) soda. Check your blood sugar again 15 minutes after you take the carbohydrate. If the second blood sugar test is still at or below 70 mg/dL (3.9 mmol/L), take 15 grams of a carbohydrate again. If your blood sugar doesn't increase above 70 mg/dL (3.9 mmol/L) after 3 tries, get medical help right away. After your blood sugar returns to normal, eat a meal or a snack within 1 hour. Keep taking your daily medicines as told by your provider. Check your blood sugar more often than you normally would. Write down your results. Call your provider if you have trouble keeping your blood sugar in your target range. Green zone These signs mean you're doing well and can continue what you're doing to manage your diabetes: Your blood sugar is within your personal target range. For most people, a blood sugar level before a meal should be 80-130 mg/dL (4.4-7.2 mmol/L). You feel well, and you're able to do daily activities. If you're in the green zone, continue to manage your diabetes as told by your provider. To do this: Eat a healthy diet. Exercise regularly. Check your blood sugar as told. Take your medicines only as told. Where to find more information American Diabetes Association (ADA):  diabetes.org Association of Diabetes Care & Education Specialists (ADCES): adces.org/diabetes-education-dsmes This information is not intended to replace advice given to you by your health care provider. Make sure you discuss any questions you have with your health care provider. Document Revised: 05/14/2023 Document Reviewed: 05/14/2023 Elsevier Patient Education  2024 Elsevier Inc.   Diabetes Mellitus and Nutrition, Adult When you have diabetes, or diabetes mellitus, it is very important to have healthy eating habits because your blood sugar (glucose) levels are greatly affected by what you eat and drink. Eating healthy foods in the right amounts, at about the same times every day, can help you: Manage your blood glucose. Lower your risk of heart disease. Improve your blood pressure. Reach or maintain a healthy weight. What can affect my meal plan? Every person with diabetes is different, and each person has different needs for a meal plan. Your health care provider may recommend that you work with a dietitian to make a meal plan that is best for you. Your meal plan may vary depending on factors such as: The calories you need. The medicines you take. Your weight. Your blood  glucose, blood pressure, and cholesterol levels. Your activity level. Other health conditions you have, such as heart or kidney disease. How do carbohydrates affect me? Carbohydrates, also called carbs, affect your blood glucose level more than any other type of food. Eating carbs raises the amount of glucose in your blood. It is important to know how many carbs you can safely have in each meal. This is different for every person. Your dietitian can help you calculate how many carbs you should have at each meal and for each snack. How does alcohol affect me? Alcohol can cause a decrease in blood glucose (hypoglycemia), especially if you use insulin  or take certain diabetes medicines by mouth. Hypoglycemia can be a  life-threatening condition. Symptoms of hypoglycemia, such as sleepiness, dizziness, and confusion, are similar to symptoms of having too much alcohol. Do not drink alcohol if: Your health care provider tells you not to drink. You are pregnant, may be pregnant, or are planning to become pregnant. If you drink alcohol: Limit how much you have to: 0-1 drink a day for women. 0-2 drinks a day for men. Know how much alcohol is in your drink. In the [place] one drink equals one 12 oz bottle of beer (355 mL), one 5 oz glass of wine (148 mL), or one 1 oz glass of hard liquor (44 mL). Keep yourself hydrated with water, diet soda, or unsweetened iced tea. Keep in mind that regular soda, juice, and other mixers may contain a lot of sugar and must be counted as carbs. What are tips for following this plan?  Reading food labels Start by checking the serving size on the Nutrition Facts label of packaged foods and drinks. The number of calories and the amount of carbs, fats, and other nutrients listed on the label are based on one serving of the item. Many items contain more than one serving per package. Check the total grams (g) of carbs in one serving. Check the number of grams of saturated fats and trans fats in one serving. Choose foods that have a low amount or none of these fats. Check the number of milligrams (mg) of salt (sodium) in one serving. Most people should limit total sodium intake to less than 2,300 mg per day. Always check the nutrition information of foods labeled as low-fat or nonfat. These foods may be higher in added sugar or refined carbs and should be avoided. Talk to your dietitian to identify your daily goals for nutrients listed on the label. Shopping Avoid buying canned, pre-made, or processed foods. These foods tend to be high in fat, sodium, and added sugar. Shop around the outside edge of the grocery store. This is where you will most often find fresh fruits and vegetables,  bulk grains, fresh meats, and fresh dairy products. Cooking Use low-heat cooking methods, such as baking, instead of high-heat cooking methods, such as deep frying. Cook using healthy oils, such as olive, canola, or sunflower oil. Avoid cooking with butter, cream, or high-fat meats. Meal planning Eat meals and snacks regularly, preferably at the same times every day. Avoid going long periods of time without eating. Eat foods that are high in fiber, such as fresh fruits, vegetables, beans, and whole grains. Eat 4-6 oz (112-168 g) of lean protein each day, such as lean meat, chicken, fish, eggs, or tofu. One ounce (oz) (28 g) of lean protein is equal to: 1 oz (28 g) of meat, chicken, or fish. 1 egg.  cup (62 g) of tofu. Eat  some foods each day that contain healthy fats, such as avocado, nuts, seeds, and fish. What foods should I eat? Fruits Berries. Apples. Oranges. Peaches. Apricots. Plums. Grapes. Mangoes. Papayas. Pomegranates. Kiwi. Cherries. Vegetables Leafy greens, including lettuce, spinach, kale, chard, collard greens, mustard greens, and cabbage. Beets. Cauliflower. Broccoli. Carrots. Green beans. Tomatoes. Peppers. Onions. Cucumbers. Brussels sprouts. Grains Whole grains, such as whole-wheat or whole-grain bread, crackers, tortillas, cereal, and pasta. Unsweetened oatmeal. Quinoa. Brown or wild rice. Meats and other proteins Seafood. Poultry without skin. Lean cuts of poultry and beef. Tofu. Nuts. Seeds. Dairy Low-fat or fat-free dairy products such as milk, yogurt, and cheese. The items listed above may not be a complete list of foods and beverages you can eat and drink. Contact a dietitian for more information. What foods should I avoid? Fruits Fruits canned with syrup. Vegetables Canned vegetables. Frozen vegetables with butter or cream sauce. Grains Refined white flour and flour products such as bread, pasta, snack foods, and cereals. Avoid all processed foods. Meats and  other proteins Fatty cuts of meat. Poultry with skin. Breaded or fried meats. Processed meat. Avoid saturated fats. Dairy Full-fat yogurt, cheese, or milk. Beverages Sweetened drinks, such as soda or iced tea. The items listed above may not be a complete list of foods and beverages you should avoid. Contact a dietitian for more information. Questions to ask a health care provider Do I need to meet with a certified diabetes care and education specialist? Do I need to meet with a dietitian? What number can I call if I have questions? When are the best times to check my blood glucose? Where to find more information: American Diabetes Association: diabetes.org Academy of Nutrition and Dietetics: eatright.[employer] of Diabetes and Digestive and Kidney Diseases: StageSync.si Association of Diabetes Care & Education Specialists: diabeteseducator.org Summary It is important to have healthy eating habits because your blood sugar (glucose) levels are greatly affected by what you eat and drink. It is important to use alcohol carefully. A healthy meal plan will help you manage your blood glucose and lower your risk of heart disease. Your health care provider may recommend that you work with a dietitian to make a meal plan that is best for you. This information is not intended to replace advice given to you by your health care provider. Make sure you discuss any questions you have with your health care provider. Document Revised: 04/25/2020 Document Reviewed: 04/26/2020 Elsevier Patient Education  2024 Elsevier Inc.  Carbohydrate Counting for Diabetes Mellitus, Adult Carbohydrate counting is a method of keeping track of how many carbohydrates you eat. Eating carbohydrates increases the amount of sugar (glucose) in the blood. Counting how many carbohydrates you eat improves how well you manage your blood glucose. This, in turn, helps you manage your diabetes. Carbohydrates are measured in  grams (g) per serving. It is important to know how many carbohydrates (in grams or by serving size) you can have in each meal. This is different for every person. A dietitian can help you make a meal plan and calculate how many carbohydrates you should have at each meal and snack. What foods contain carbohydrates? Carbohydrates are found in the following foods: Grains, such as breads and cereals. Dried beans and soy products. Starchy vegetables, such as potatoes, peas, and corn. Fruit and fruit juices. Milk and yogurt. Sweets and snack foods, such as cake, cookies, candy, chips, and soft drinks. How do I count carbohydrates in foods? There are two ways to count carbohydrates  in food. You can read food labels or learn standard serving sizes of foods. You can use either of these methods or a combination of both. Using the Nutrition Facts label The Nutrition Facts list is included on the labels of almost all packaged foods and beverages in the [place] . It includes: The serving size. Information about nutrients in each serving, including the grams of carbohydrate per serving. To use the Nutrition Facts, decide how many servings you will have. Then, multiply the number of servings by the number of carbohydrates per serving. The resulting number is the total grams of carbohydrates that you will be having. Learning the standard serving sizes of foods When you eat carbohydrate foods that are not packaged or do not include Nutrition Facts on the label, you need to measure the servings in order to count the grams of carbohydrates. Measure the foods that you will eat with a food scale or measuring cup, if needed. Decide how many standard-size servings you will eat. Multiply the number of servings by 15. For foods that contain carbohydrates, one serving equals 15 g of carbohydrates. For example, if you eat 2 cups or 10 oz (300 g) of strawberries, you will have eaten 2 servings and 30 g of  carbohydrates (2 servings x 15 g = 30 g). For foods that have more than one food mixed, such as soups and casseroles, you must count the carbohydrates in each food that is included. The following list contains standard serving sizes of common carbohydrate-rich foods. Each of these servings has about 15 g of carbohydrates: 1 slice of bread. 1 six-inch (15 cm) tortilla. ? cup or 2 oz (53 g) cooked rice or pasta.  cup or 3 oz (85 g) cooked or canned, drained and rinsed beans or lentils.  cup or 3 oz (85 g) starchy vegetable, such as peas, corn, or squash.  cup or 4 oz (120 g) hot cereal.  cup or 3 oz (85 g) boiled or mashed potatoes, or  or 3 oz (85 g) of a large baked potato.  cup or 4 fl oz (118 mL) fruit juice. 1 cup or 8 fl oz (237 mL) milk. 1 small or 4 oz (106 g) apple.  or 2 oz (63 g) of a medium banana. 1 cup or 5 oz (150 g) strawberries. 3 cups or 1 oz (28.3 g) popped popcorn. What is an example of carbohydrate counting? To calculate the grams of carbohydrates in this sample meal, follow the steps shown below. Sample meal 3 oz (85 g) chicken breast. ? cup or 4 oz (106 g) brown rice.  cup or 3 oz (85 g) corn. 1 cup or 8 fl oz (237 mL) milk. 1 cup or 5 oz (150 g) strawberries with sugar-free whipped topping. Carbohydrate calculation Identify the foods that contain carbohydrates: Rice. Corn. Milk. Strawberries. Calculate how many servings you have of each food: 2 servings rice. 1 serving corn. 1 serving milk. 1 serving strawberries. Multiply each number of servings by 15 g: 2 servings rice x 15 g = 30 g. 1 serving corn x 15 g = 15 g. 1 serving milk x 15 g = 15 g. 1 serving strawberries x 15 g = 15 g. Add together all of the amounts to find the total grams of carbohydrates eaten: 30 g + 15 g + 15 g + 15 g = 75 g of carbohydrates total. What are tips for following this plan? Shopping Develop a meal plan and then make a shopping  list. Buy fresh and frozen  vegetables, fresh and frozen fruit, dairy, eggs, beans, lentils, and whole grains. Look at food labels. Choose foods that have more fiber and less sugar. Avoid processed foods and foods with added sugars. Meal planning Aim to have the same number of grams of carbohydrates at each meal and for each snack time. Plan to have regular, balanced meals and snacks. Where to find more information American Diabetes Association: diabetes.org Centers for Disease Control and Prevention: TonerPromos.no Academy of Nutrition and Dietetics: eatright.org Association of Diabetes Care & Education Specialists: diabeteseducator.org Summary Carbohydrate counting is a method of keeping track of how many carbohydrates you eat. Eating carbohydrates increases the amount of sugar (glucose) in your blood. Counting how many carbohydrates you eat improves how well you manage your blood glucose. This helps you manage your diabetes. A dietitian can help you make a meal plan and calculate how many carbohydrates you should have at each meal and snack. This information is not intended to replace advice given to you by your health care provider. Make sure you discuss any questions you have with your health care provider. Document Revised: 04/25/2020 Document Reviewed: 04/26/2020 Elsevier Patient Education  2024 [employer].

## 2024-04-22 NOTE — Patient Outreach (Signed)
 Complex Care Management   Visit Note  04/22/2024  Name:  Alison [name] MRN: [id number] DOB: 1976-02-07  Situation: Referral received for Complex Care Management related to Diabetes with Complications I obtained verbal consent from Patient.  Visit completed with patient  on the phone  Background:   Past Medical History:  Diagnosis Date   Abnormal uterine bleeding    Allergy    Anxiety    Arthritis    Bipolar disorder (manic depression) (HCC)    Cancer (HCC) Diffuse B-Cell Lymphoma   2015   Chest pain    a. 09/2021 MV: Low risk without ischemia or infarct.  Normal EF.   Chronic kidney failure    Chronic renal disease, stage III (HCC)    Diabetes mellitus without complication (HCC)    DLBCL (diffuse large B cell lymphoma) (HCC) 2015   Right axillary lymph node resected and chemo tx's.   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 02/2023 Echo: EF 55-60%, no rwma, nl RV size/fxn. Mildly dil LA.   Personality disorder (HCC)    Pineal gland cyst    Post herpetic neuralgia    PTSD (post-traumatic stress disorder)    RBBB    Renal disorder    S/P liver transplant (HCC) 1993   Sleep apnea    has not recieved cpap yet-other one was recalled    Assessment: Patient Reported Symptoms:  Cognitive Cognitive Status: No symptoms reported   Health Maintenance Behaviors: Annual physical exam Healing Pattern: Slow Health Facilitated by: Pain control, Rest  Neurological Neurological Review of Symptoms: Vision changes Neurological Comment: since hospitalization - blurry but stable   HEENT HEENT Symptoms Reported: Not assessed      Cardiovascular Cardiovascular Symptoms Reported: No symptoms reported Does patient have uncontrolled Hypertension?: No Cardiovascular Management Strategies: Medication therapy, Diet modification Cardiovascular Comment: extensive discussion about healthy choices, low salt, low carbs, options re apps, healthy vegan cooking videos, meal prep  Respiratory Respiratory Symptoms Reported: No symptoms reported    Endocrine Endocrine Symptoms Reported: Hypoglycemia, Shakiness Is patient diabetic?: Yes Is patient checking blood sugars at home?: Yes List most recent blood sugar readings, include date and time of day: FBG today 190 (on 50 mg prenisone daily - endo adjusted insulin ) has had 2 espisodes lows, discussed proper treatment, diet, carb counting healthy vegan eating and meal prep Endocrine Comment: checking regularly and taking insulin  as ordered  Gastrointestinal Gastrointestinal Symptoms Reported: Not assessed      Genitourinary Genitourinary Symptoms Reported: No symptoms reported    Integumentary Integumentary Symptoms Reported: No symptoms reported    Musculoskeletal Musculoskelatal Symptoms Reviewed: Difficulty walking, Limited mobility, Unsteady gait, Weakness Musculoskeletal Management Strategies: Medication therapy, Medical device Falls in the past year?: Yes Number of falls in past year: 2 or more Was there an injury with Fall?: Yes Fall Risk Category Calculator: 3 Patient Fall Risk Level: High Fall Risk Patient at Risk for Falls Due  to: History of fall(s), Impaired balance/gait, Impaired mobility, Impaired vision Fall risk Follow up: Falls evaluation completed, Falls prevention discussed  Psychosocial Psychosocial Symptoms Reported: Anxiety - if selected complete GAD, Depression - if selected complete PHQ 2-9 Behavioral Management Strategies: Medication therapy, Support system Behavioral Health Comment: Service Dog in Training  - Major Change/Loss/Stressor/Fears (CP): Medical condition, self Techniques to Cope with Loss/Stress/Change: Counseling, Diversional activities Quality of Family Relationships: helpful, involved, supportive Do you feel physically threatened by others?: No      04/22/2024    3:37 PM  Depression screen PHQ 2/9  Decreased Interest 2  Down, Depressed, Hopeless 1  PHQ - 2 Score 3    There were no vitals filed for this visit.  Medications Reviewed Today     Reviewed by Devra Lands, RN (Registered Nurse) on 04/22/24 at 1513  Med List Status: <None>   Medication Order Taking? Sig Documenting Provider Last Dose Status Informant  Accu-Chek Softclix Lancets lancets [id number] Yes 2 (two) times daily. [provider]  Active   albuterol  (VENTOLIN  HFA) 108 (90 Base) MCG/ACT inhaler [id number] Yes Inhale 2 puffs into the lungs every 6 (six) hours as needed for wheezing or shortness of breath. Gareth Mliss FALCON, FNP  Active Self           Med Note [facility], ELIZABETH A   Mon Mar 15, 2024 10:57 AM) PRN  baclofen  (LIORESAL ) 10 MG tablet [id number] Yes Take 1 tablet by mouth 2 (two) times daily. [provider]  Active Self  benztropine  (COGENTIN ) 1 MG tablet [id number] Yes TAKE 1 TABLET BY MOUTH ONCE DAILY AS NEEDED FOR TREMORS [name], [name], MD  Active Self  Continuous Glucose Sensor (FREESTYLE LIBRE 3 PLUS SENSOR) MISC [id number] Yes CHANGE SENSOR EVERY 15 DAYS Motwani, Komal, MD  Active   Cranberry-Vitamin C-Probiotic (AZO CRANBERRY PO) [id number] Yes Take by mouth. [provider]  Active   fluticasone  (FLONASE ) 50 MCG/ACT nasal spray [id number] Yes Place into the nose. [provider]  Active Self           Med Note SIGRID, TIFFANY N   Sun Apr 11, 2024 11:59 PM) PRN  glipiZIDE  (GLUCOTROL  XL) 10 MG 24 hr tablet [id number] Yes TAKE 1 TABLET BY MOUTH IN THE MORNING AND AT BEDTIME Motwani, Obadiah, MD  Active Self  glucose blood (RELION GLUCOSE TEST STRIPS) test strip  [id number] Yes Use as instructed Gareth Mliss FALCON, FNP  Active   insulin  glargine (LANTUS  SOLOSTAR) 100 UNIT/ML Solostar Pen [id number] Yes Inject 40 Units into the skin 2 (two) times daily. Fausto Sor A, DO  Active   Insulin  Pen Needle 32G X 6 MM MISC [id number] Yes 1 each by Does not apply route as needed. Pender, Julie F, FNP  Active   lisinopril  (ZESTRIL ) 20 MG tablet [id number]  TAKE 1 TABLET BY MOUTH ONCE DAILY *PATIENT WILL NEED TO SCHEDULE AN APPOINTMENT FOR ADDITIONAL REFILLS* Gareth Mliss FALCON, FNP  Active Self  lurasidone  (LATUDA ) 20 MG TABS tablet [id number] Yes TAKE 1 TABLET BY MOUTH ONCE DAILY WITH SUPPER *REFILL REQUEST* [name], [name], MD  Active Self  magnesium  oxide (MAG-OX) 400 MG tablet [id number] Yes Take 1,000 mg by mouth 2 (two) times daily. [provider]  Active Self  Melatonin 10 MG TABS [id number] Yes Take 10 mg by mouth at bedtime. [provider]  Active Self  metFORMIN  (GLUCOPHAGE ) 1000 MG tablet [id number] Yes TAKE 1 TABLET BY MOUTH TWICE DAILY WITH MEALS *PATIENT WILL NEED TO SCHEDULE APPOINTMENT FOR  ADDITIONAL REFILLS* Gareth Mliss FALCON, FNP  Active Self  mirabegron  ER (MYRBETRIQ ) 50 MG TB24 tablet [id number] Yes Take 1 tablet (50 mg total) by mouth daily. Gareth Mliss FALCON, FNP  Active Self  mirtazapine  (REMERON ) 15 MG tablet [id number] Yes TAKE 1 TABLET BY MOUTH EVERY DAY AT BEDTIME *REFILL REQUEST* [name], [name], MD  Active Self  naloxone  (NARCAN ) nasal spray 4 mg/0.1 mL [id number] Yes Place 0.4 mg into the nose once. [provider]  Active Self           Med Note GROVER, BURNARD S   Wed Oct 15, 2023  1:51 PM) prn  NOVOLOG  FLEXPEN 100 UNIT/ML FlexPen [id number] Yes Inject 40 Units into the skin 3 (three) times daily with meals. Hold if not eating at least half of a full meal. Fausto BURNARD LABOR, DO  Active   ondansetron  (ZOFRAN ) 4 MG tablet [id number] Yes Take 1 tablet (4 mg total) by mouth every 6 (six) hours as needed for nausea. Caleen Qualia, MD   Active Self  oxyCODONE  (OXY IR/ROXICODONE ) 5 MG immediate release tablet [id number] Yes Take 5 mg by mouth 3 (three) times daily as needed. [provider]  Active Self  pantoprazole  (PROTONIX ) 40 MG tablet [id number] Yes Take 1 tablet (40 mg total) by mouth daily. Recommend using while on Prednisone . Fausto BURNARD A, DO  Active   PRECISION QID TEST test strip [id number] Yes  [provider]  Active   predniSONE  (DELTASONE ) 50 MG tablet [id number] Yes Take one tablet (50 mg) by mouth once daily until follow up with Neurology. Fausto BURNARD A, DO  Active   pregabalin  (LYRICA ) 200 MG capsule [id number] Yes Take 1 capsule (200 mg total) by mouth 2 (two) times daily. Floy Roberts, MD  Active Self  rosuvastatin  (CRESTOR ) 10 MG tablet [id number] Yes Take 1 tablet (10 mg total) by mouth daily. Darliss Rogue, MD  Active Self  Semaglutide , 2 MG/DOSE, 8 MG/3ML SOPN [id number] Yes Inject 2 mg as directed once a week. Dartha Ernst, MD  Active   tacrolimus  (PROGRAF ) 1 MG capsule [id number] Yes Take 1 capsule by mouth every morning. [provider]  Active Self  TYLENOL  PM EXTRA STRENGTH 1000-50 MG/30ML LIQD [id number] Yes Take by mouth. [provider]  Active Self           Med Note SIGRID, TIFFANY N   Mon Apr 12, 2024 12:01 AM) PRN  UBRELVY  50 MG TABS [id number] Yes Take 1 tablet by mouth daily as needed. [provider]  Active Self  venlafaxine  XR (EFFEXOR -XR) 37.5 MG 24 hr capsule [id number] Yes Take 37.5 mg by mouth every morning. [provider]  Active Self  venlafaxine  XR (EFFEXOR -XR) 75 MG 24 hr capsule [id number] Yes Take 1 capsule (75 mg total) by mouth daily with breakfast. Take along with 37.5 mg daily [name], [name], MD  Active Self            Recommendation:   PCP Follow-up Continue Current Plan of Care Contact Vascular for follow up.  Follow Up Plan:   Telephone follow-up in 1 month  [name], MSN, RN [facility]   [facility], [facility] RN Care Manager Direct Dial: [phone] Fax: [phone]

## 2024-04-26 ENCOUNTER — Ambulatory Visit: Admitting: Physical Therapy

## 2024-04-26 ENCOUNTER — Encounter

## 2024-04-26 DIAGNOSIS — Z1331 Encounter for screening for depression: Secondary | ICD-10-CM | POA: Diagnosis not present

## 2024-04-26 DIAGNOSIS — M797 Fibromyalgia: Secondary | ICD-10-CM | POA: Diagnosis not present

## 2024-04-26 DIAGNOSIS — G43119 Migraine with aura, intractable, without status migrainosus: Secondary | ICD-10-CM | POA: Diagnosis not present

## 2024-04-26 DIAGNOSIS — G500 Trigeminal neuralgia: Secondary | ICD-10-CM | POA: Diagnosis not present

## 2024-04-26 DIAGNOSIS — F32A Depression, unspecified: Secondary | ICD-10-CM | POA: Diagnosis not present

## 2024-04-26 DIAGNOSIS — H469 Unspecified optic neuritis: Secondary | ICD-10-CM | POA: Diagnosis not present

## 2024-04-28 ENCOUNTER — Encounter

## 2024-04-28 ENCOUNTER — Ambulatory Visit (INDEPENDENT_AMBULATORY_CARE_PROVIDER_SITE_OTHER): Admitting: Psychiatry

## 2024-04-28 ENCOUNTER — Ambulatory Visit: Admitting: Physical Therapy

## 2024-04-28 ENCOUNTER — Encounter: Payer: Self-pay | Admitting: Psychiatry

## 2024-04-28 VITALS — BP 122/88 | HR 81 | Temp 98.5°F | Ht 66.0 in | Wt 249.6 lb

## 2024-04-28 DIAGNOSIS — F411 Generalized anxiety disorder: Secondary | ICD-10-CM | POA: Diagnosis not present

## 2024-04-28 DIAGNOSIS — F4310 Post-traumatic stress disorder, unspecified: Secondary | ICD-10-CM | POA: Diagnosis not present

## 2024-04-28 DIAGNOSIS — F3176 Bipolar disorder, in full remission, most recent episode depressed: Secondary | ICD-10-CM

## 2024-04-28 DIAGNOSIS — G4701 Insomnia due to medical condition: Secondary | ICD-10-CM

## 2024-04-28 NOTE — Progress Notes (Unsigned)
 [facility] MD OP Progress Note  04/28/2024 2:03 PM Alison [name]  MRN:  [id number]  Chief Complaint:  Chief Complaint  Patient presents with   Follow-up   Depression   Anxiety   Medication Refill   Discussed the use of AI scribe software for clinical note transcription with the patient, who gave verbal consent to proceed.  History of Present Illness Alison [name] is a 48 year old Caucasian female, lives in [place], single, on disability, history of bipolar disorder, PTSD, medical problems including GI issues, history of liver transplant, B-cell lymphoma, SLE, diabetes mellitus, stage IV chronic kidney disease, OSA currently not compliant with CPAP, chronic migraine headaches, hyperlipidemia, hypertension was evaluated in office today.  She has ongoing sleep disturbances. Mirtazapine  was temporarily discontinued during her recent hospitalization, and she is now back on it to aid her sleep. She is back on mirtazapine  to aid her sleep but experiences difficulty with her CPAP mask, leading to non-compliance. She takes about an hour to fall asleep but reports getting sufficient sleep.  She was hospitalized for a week due to lupus complications, specifically optic perineuritis, which involved a lumbar puncture and a temporal artery biopsy. Her lupus markers, including ANA, are significantly elevated. She is on 50 mg of prednisone  daily for at least a month, which has increased her appetite and affected her diabetes control.  She continues to be compliant on her venlafaxine , Latuda  and benztropine  as needed.  Denies thoughts of self-harm or harm to others. No muscle spasms, rigidity, tremor, stiffness, or dental pain.  She experienced a fall two days ago, resulting in intermittent dull, achy pain on her left side and a twisted ankle. She rates the pain as a 2 out of 10 and has not taken any medication for it, noting that the swelling is decreasing.  She currently has an emotional support dog,  Alison [name] and that has been beneficial.      Visit Diagnosis:    ICD-10-CM   1. Bipolar 1 disorder, depressed, full remission (HCC)  F31.76     2. PTSD (post-traumatic stress disorder)  F43.10     3. GAD (generalized anxiety disorder)  F41.1     4. Insomnia due to medical condition  G47.01    Pain, obstructive sleep apnea noncompliant on CPAP      Past Psychiatric History: I have reviewed past psychiatric history from progress note on 06/30/2019.  Past Medical History:  Past Medical History:  Diagnosis Date   Abnormal uterine bleeding    Allergy    Anxiety    Arthritis    Bipolar disorder (manic depression) (HCC)    Cancer (HCC) Diffuse B-Cell Lymphoma   2015   Chest pain    a. 09/2021 MV: Low risk without ischemia or infarct.  Normal EF.   Chronic kidney failure    Chronic renal disease, stage III (HCC)    Diabetes mellitus without complication (HCC)    DLBCL (diffuse large B cell lymphoma) (HCC) 2015   Right axillary lymph node resected and chemo tx's.   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 02/2023 Echo: EF 55-60%, no rwma, nl RV size/fxn. Mildly dil LA.   Personality disorder (HCC)    Pineal gland cyst    Post herpetic neuralgia    PTSD (post-traumatic stress disorder)    RBBB    Renal disorder    S/P liver transplant (HCC) 1993   Sleep apnea    has not recieved cpap yet-other one was recalled    Past Surgical History:  Procedure Laterality Date   ARTERY BIOPSY Right 04/14/2024   Procedure: BIOPSY TEMPORAL ARTERY;  Surgeon: Marea Selinda RAMAN, MD;  Location:  [facility];  Service: General;  Laterality: Right;   BONE MARROW BIOPSY  01/13/2015   BREAST BIOPSY Right 12/2014   US  Axilla Bx, Positive for Lymphoma   BREAST BIOPSY Right 2011   benign   BREAST BIOPSY Right 2023   Stereo bx, X-clip, Fat Necrosis   BREAST BIOPSY Right 01/20/2024   US  RT BREAST BX W LOC DEV 1ST LESION IMG BX SPEC US  GUIDE 01/20/2024 [facility]   BREAST SURGERY Right 2016   Lymphoma, lymph node removal. pt then got a seroma and had to do more surgery   CHOLECYSTECTOMY     COLONOSCOPY     COLONOSCOPY WITH PROPOFOL  N/A 02/28/2022   Procedure: COLONOSCOPY WITH PROPOFOL ;  Surgeon: Jinny Carmine, MD;  Location: [facility];  Service: Endoscopy;  Laterality: N/A;   ESOPHAGOGASTRODUODENOSCOPY     ESOPHAGOGASTRODUODENOSCOPY (EGD) WITH PROPOFOL  N/A 01/09/2021   Procedure: ESOPHAGOGASTRODUODENOSCOPY (EGD) WITH PROPOFOL ;  Surgeon: Jinny Carmine, MD;  Location: [facility];  Service: Endoscopy;  Laterality: N/A;   HERNIA REPAIR     x4-all from liver transplant   HYSTEROSCOPY WITH D & C N/A 11/13/2021   Procedure: DILATATION AND CURETTAGE /HYSTEROSCOPY;  Surgeon: Victor Claudell SAUNDERS, MD;  Location: [facility];  Service: Gynecology;  Laterality: N/A;   infusaport     LIVER TRANSPLANT  12/17/1991   LUMBAR PUNCTURE     PORT A CATH INJECTION ([facility])     RENAL BIOPSY     tumor removal  2015   cancer right side    Family Psychiatric History: I have reviewed family psychiatric history from progress note on 06/30/2019.  Family History:  Family History  Problem Relation Age of Onset   Heart disease Mother    Hypertension Mother    Cancer - Other Mother    Bipolar disorder Mother    Anxiety disorder Mother    Arthritis Mother    Depression Mother    Heart disease Father    Hypertension Father    Diabetes Father    Parkinson's disease Maternal Grandmother    COPD Maternal Grandmother    Cancer Maternal Aunt    Cancer Maternal Uncle    Cancer Maternal Grandfather     Lupus Paternal Grandmother    Hypertension Brother    Alcohol abuse Brother    Depression Brother    Alcohol abuse Maternal Uncle     Social History: I have reviewed social history from progress note on 06/30/2019. Social History   Socioeconomic History   Marital status: Single    Spouse name: Not on file   Number of children: 0   Years of education: 9   Highest education level: GED or equivalent  Occupational History   Occupation: disabled  Tobacco Use   Smoking status:  Never    Passive exposure: Past   Smokeless tobacco: Never   Tobacco comments:    Occasional CBD oil  Vaping Use   Vaping status: Never Used  Substance and Sexual Activity   Alcohol use: Not on file   Drug use: Not Currently    Comment: pain managment last used in early april   Sexual activity: Not Currently  Other Topics Concern   Not on file  Social History Narrative   Not on file   Social Drivers of Health   Financial Resource Strain: Low Risk  (04/01/2024)   Overall Financial Resource Strain (CARDIA)    Difficulty of Paying Living Expenses: Not very hard  Food Insecurity: No Food Insecurity (04/22/2024)   Hunger Vital Sign    Worried About Running Out of Food in the Last Year: Never true    Ran Out of Food in the Last Year: Never true  Transportation Needs: No Transportation Needs (04/22/2024)   PRAPARE - Administrator, Civil Service (Medical): No    Lack of Transportation (Non-Medical): No  Physical Activity: Inactive (04/01/2024)   Exercise Vital Sign    Days of Exercise per Week: 0 days    Minutes of Exercise per Session: 0 min  Stress: Stress Concern Present (04/01/2024)   [employer] of Occupational Health - Occupational Stress Questionnaire    Feeling of Stress: To some extent  Social Connections: Unknown (04/12/2024)   Social Connection and Isolation Panel    Frequency of Communication with Friends and Family: Patient declined    Frequency of Social Gatherings with  Friends and Family: Patient declined    Attends Religious Services: Patient declined    Database administrator or Organizations: Patient declined    Attends Banker Meetings: Patient declined    Marital Status: Living with partner    Allergies:  Allergies  Allergen Reactions   Penicillin G Itching, Rash, Anaphylaxis and Palpitations   Lamictal  [Lamotrigine ] Rash   Tape    Carbamazepine Other (See Comments)    Medication interaction-prograf     Hydrocodone-Acetaminophen  Itching   Naproxen Itching   Penicillins    Vancomycin  Rash    Macular/blotchy rash at infusion site    Metabolic Disorder Labs: Lab Results  Component Value Date   HGBA1C 9.7 (H) 04/01/2024   MPG 232 04/01/2024   MPG 292 11/13/2023   Lab Results  Component Value Date   PROLACTIN 16.4 12/12/2022   PROLACTIN 13.4 07/15/2019   Lab Results  Component Value Date   CHOL 122 04/01/2024   TRIG 329 (H) 04/01/2024   HDL 30 (L) 04/01/2024   CHOLHDL 4.1 04/01/2024   VLDL 17 03/08/2023   LDLCALC 57 04/01/2024   LDLCALC 50 11/13/2023   Lab Results  Component Value Date   TSH 1.56 04/01/2024   TSH 4.878 (H) 03/09/2023    Therapeutic Level Labs: No results found for: LITHIUM No results found for: VALPROATE No results found for: CBMZ  Current Medications: Current Outpatient Medications  Medication Sig Dispense Refill   Accu-Chek Softclix Lancets lancets 2 (two) times daily.     albuterol  (VENTOLIN  HFA) 108 (90 Base) MCG/ACT inhaler Inhale 2 puffs into the lungs every 6 (six) hours as needed for wheezing or shortness of breath. 8 g 0   baclofen  (LIORESAL ) 10 MG tablet Take 1 tablet by mouth 2 (two) times daily.     benztropine  (COGENTIN ) 1 MG tablet TAKE 1 TABLET BY MOUTH ONCE DAILY AS NEEDED FOR TREMORS  30 tablet 10   Continuous Glucose Sensor (FREESTYLE LIBRE 3 PLUS SENSOR) MISC CHANGE SENSOR EVERY 15 DAYS 6 each 3   Cranberry-Vitamin C-Probiotic (AZO CRANBERRY PO) Take by mouth.      fluticasone  (FLONASE ) 50 MCG/ACT nasal spray Place into the nose.     glipiZIDE  (GLUCOTROL  XL) 10 MG 24 hr tablet TAKE 1 TABLET BY MOUTH IN THE MORNING AND AT BEDTIME 60 tablet 5   glucose blood (RELION GLUCOSE TEST STRIPS) test strip Use as instructed 100 each 12   insulin  glargine (LANTUS  SOLOSTAR) 100 UNIT/ML Solostar Pen Inject 40 Units into the skin 2 (two) times daily. 15 mL 0   Insulin  Pen Needle 32G X 6 MM MISC 1 each by Does not apply route as needed. 100 each 5   [Paused] lisinopril  (ZESTRIL ) 20 MG tablet TAKE 1 TABLET BY MOUTH ONCE DAILY *PATIENT WILL NEED TO SCHEDULE AN APPOINTMENT FOR ADDITIONAL REFILLS* 90 tablet 0   lurasidone  (LATUDA ) 20 MG TABS tablet TAKE 1 TABLET BY MOUTH ONCE DAILY WITH SUPPER *REFILL REQUEST* 90 tablet 10   magnesium  oxide (MAG-OX) 400 MG tablet Take 1,000 mg by mouth 2 (two) times daily.     Melatonin 10 MG TABS Take 10 mg by mouth at bedtime.     metFORMIN  (GLUCOPHAGE ) 1000 MG tablet TAKE 1 TABLET BY MOUTH TWICE DAILY WITH MEALS *PATIENT WILL NEED TO SCHEDULE APPOINTMENT FOR ADDITIONAL REFILLS* 180 tablet 0   mirabegron  ER (MYRBETRIQ ) 50 MG TB24 tablet Take 1 tablet (50 mg total) by mouth daily. 90 tablet 3   mirtazapine  (REMERON ) 15 MG tablet TAKE 1 TABLET BY MOUTH EVERY DAY AT BEDTIME *REFILL REQUEST* 90 tablet 10   naloxone  (NARCAN ) nasal spray 4 mg/0.1 mL Place 0.4 mg into the nose once.     NOVOLOG  FLEXPEN 100 UNIT/ML FlexPen Inject 40 Units into the skin 3 (three) times daily with meals. Hold if not eating at least half of a full meal. 15 mL 11   ondansetron  (ZOFRAN ) 4 MG tablet Take 1 tablet (4 mg total) by mouth every 6 (six) hours as needed for nausea. 20 tablet 0   oxyCODONE  (OXY IR/ROXICODONE ) 5 MG immediate release tablet Take 5 mg by mouth 3 (three) times daily as needed.     pantoprazole  (PROTONIX ) 40 MG tablet Take 1 tablet (40 mg total) by mouth daily. Recommend using while on Prednisone . 30 tablet 0   PRECISION QID TEST test strip       predniSONE  (DELTASONE ) 50 MG tablet Take one tablet (50 mg) by mouth once daily until follow up with Neurology. 30 tablet 0   pregabalin  (LYRICA ) 200 MG capsule Take 1 capsule (200 mg total) by mouth 2 (two) times daily. 60 capsule 0   rosuvastatin  (CRESTOR ) 10 MG tablet Take 1 tablet (10 mg total) by mouth daily. 90 tablet 2   Semaglutide , 2 MG/DOSE, 8 MG/3ML SOPN Inject 2 mg as directed once a week. 9 mL 1   tacrolimus  (PROGRAF ) 1 MG capsule Take 1 capsule by mouth every morning.     TYLENOL  PM EXTRA STRENGTH 1000-50 MG/30ML LIQD Take by mouth.     UBRELVY  50 MG TABS Take 1 tablet by mouth daily as needed.     venlafaxine  XR (EFFEXOR -XR) 37.5 MG 24 hr capsule Take 37.5 mg by mouth every morning.     venlafaxine  XR (EFFEXOR -XR) 75 MG 24 hr capsule Take 1 capsule (75 mg total) by mouth daily with breakfast. Take along with 37.5 mg daily 30  capsule 10   No current facility-administered medications for this visit.     Musculoskeletal: Strength & Muscle Tone: within normal limits Gait & Station: normal Patient leans: N/A  Psychiatric Specialty Exam: Review of Systems  Psychiatric/Behavioral:  Positive for sleep disturbance. The patient is nervous/anxious.     Blood pressure 122/88, pulse 81, temperature 98.5 F (36.9 C), temperature source Temporal, height 5' 6 (1.676 m), weight 249 lb 9.6 oz (113.2 kg), last menstrual period 10/16/2021, SpO2 96%.Body mass index is 40.29 kg/m.  General Appearance: Casual  Eye Contact:  Fair  Speech:  Clear and Coherent  Volume:  Normal  Mood:  Anxious situational  Affect:  Appropriate  Thought Process:  Goal Directed and Descriptions of Associations: Intact  Orientation:  Full (Time, Place, and Person)  Thought Content: Logical   Suicidal Thoughts:  No  Homicidal Thoughts:  No  Memory:  Immediate;   Fair Recent;   Fair Remote;   Fair  Judgement:  Fair  Insight:  Fair  Psychomotor Activity:  Normal  Concentration:  Concentration: Fair and  Attention Span: Fair  Recall:  [employer] of Knowledge: Fair  Language: Fair  Akathisia:  No  Handed:  Right  AIMS (if indicated): done  Assets:  Communication Skills Desire for Improvement Housing Social Support Transportation  ADL's:  Intact  Cognition: WNL  Sleep:  Difficulty falling asleep   Screenings: Geneticist, molecular Office Visit from 04/28/2024 in [facility] Office Visit from 11/04/2023 in [facility] Office Visit from 07/21/2023 in [facility] Office Visit from 12/12/2022 in [facility] Office Visit from 09/12/2022 in [facility]  AIMS Total Score 0 0 0 0 0   GAD-7    Flowsheet Row Office Visit from 04/28/2024 in [facility] Office Visit from 04/01/2024 in [facility] Office Visit from 01/05/2024 in [facility] Office Visit from 07/21/2023 in [facility] Office Visit from 03/19/2023 in [facility]  Total GAD-7 Score 3 14 0 8 2   PHQ2-9    Flowsheet Row Office Visit from 04/28/2024 in [facility] Patient Outreach Telephone from 04/22/2024 in [facility] Office Visit from 04/01/2024 in [facility] Office Visit from 01/05/2024 in [facility] Nutrition from 12/29/2023 in [facility] Education [facility] at [facility] Total Score 2 3 4  0 1  PHQ-9 Total Score 9 -- 11 0 --   Flowsheet Row Office Visit from 04/28/2024 in [facility] ED to Hosp-Admission (Discharged) from 04/11/2024 in [facility] 1C MEDICAL TELEMETRY  Video Visit from 04/02/2024 in [facility]  C-SSRS RISK CATEGORY No Risk No Risk No Risk     Assessment and Plan: Alison [name] is a 48 year old Caucasian female on disability, has a history of bipolar disorder, PTSD, multiple medical problems was evaluated in office today.  Discussed assessment and plan as noted below.  Insomnia-unstable Currently has difficulty falling asleep.  Several factors affecting sleep including being on prednisone  therapy as well as CPAP mask problem. Encouraged to consult pulmonologist again. Encouraged sufficient pain management as well as working on sleep hygiene techniques  PTSD-stable Currently denies any significant trauma  related symptoms Continue venlafaxine  112.5 mg daily Patient restarted mirtazapine  15 mg at bedtime few days ago and is currently tolerating it well.  Bipolar disorder in remission Currently denies any significant mood swings, mania or depression symptoms. Continue Latuda  20 mg daily Continue benztropine  1 mg as needed for side effects of Latuda  Continue psychotherapy sessions with Alison [name].  Generalized anxiety disorder-improving Current anxiety mostly related to recent health issues however has been coping better and is currently back on mirtazapine  which also has helped.  Also has emotional support dog that has been beneficial. Continue venlafaxine  as prescribed Continue mirtazapine  15 mg at bedtime Continue psychotherapy sessions.  Reviewed EKG most recent dated 04/13/2024-normal sinus rhythm with sinus arrhythmia, right bundle branch block, [id number].     Collaboration of Care: Collaboration of Care: Other patient encouraged to follow up with pulmonology for management of CPAP problems.  Encouraged to follow up with counselor and continue psychotherapy sessions.  Patient/Guardian was advised Release of Information must be obtained prior to any record release in order to collaborate  their care with an outside provider. Patient/Guardian was advised if they have not already done so to contact the registration department to sign all necessary forms in order for us  to release information regarding their care.   Consent: Patient/Guardian gives verbal consent for treatment and assignment of benefits for services provided during this visit. Patient/Guardian expressed understanding and agreed to proceed.   This note was generated in part or whole with voice recognition software. Voice recognition is usually quite accurate but there are transcription errors that can and very often do occur. I apologize for any typographical errors that were not detected and corrected.    Gonzalo Waymire, MD 04/28/2024, 2:03 PM

## 2024-04-29 ENCOUNTER — Other Ambulatory Visit: Payer: Self-pay | Admitting: "Endocrinology

## 2024-04-30 ENCOUNTER — Other Ambulatory Visit: Payer: Self-pay | Admitting: Nurse Practitioner

## 2024-04-30 DIAGNOSIS — E1165 Type 2 diabetes mellitus with hyperglycemia: Secondary | ICD-10-CM

## 2024-05-01 DIAGNOSIS — Z944 Liver transplant status: Secondary | ICD-10-CM | POA: Diagnosis not present

## 2024-05-01 DIAGNOSIS — K7460 Unspecified cirrhosis of liver: Secondary | ICD-10-CM | POA: Diagnosis not present

## 2024-05-01 DIAGNOSIS — Z5181 Encounter for therapeutic drug level monitoring: Secondary | ICD-10-CM | POA: Diagnosis not present

## 2024-05-02 ENCOUNTER — Other Ambulatory Visit: Payer: Self-pay | Admitting: Psychiatry

## 2024-05-02 DIAGNOSIS — F4310 Post-traumatic stress disorder, unspecified: Secondary | ICD-10-CM

## 2024-05-02 DIAGNOSIS — F411 Generalized anxiety disorder: Secondary | ICD-10-CM

## 2024-05-02 DIAGNOSIS — F3176 Bipolar disorder, in full remission, most recent episode depressed: Secondary | ICD-10-CM

## 2024-05-03 ENCOUNTER — Encounter

## 2024-05-03 ENCOUNTER — Ambulatory Visit: Admitting: Physical Therapy

## 2024-05-03 NOTE — Telephone Encounter (Signed)
 Requested Prescriptions  Pending Prescriptions Disp Refills   SURE COMFORT PEN NEEDLES 32G X 6 MM MISC [Pharmacy Med Name: SURE COMF PEN 32GX6MM 100 32G X 6 MM Miscellaneous] 100 each 11    Sig: USE AS DIRECTED     Endocrinology: Diabetes - Testing Supplies Passed - 05/03/2024  1:16 PM      Passed - Valid encounter within last 12 months    Recent Outpatient Visits           1 month ago Essential hypertension   [facility] Gareth Mliss FALCON, FNP   2 months ago Essential hypertension   [facility] Gareth Mliss FALCON, FNP   3 months ago Mass of upper inner quadrant of right breast   [facility] Gareth Mliss FALCON, FNP       Future Appointments             In 1 week Vivienne, Lonni Ingle, NP [facility]

## 2024-05-05 ENCOUNTER — Encounter

## 2024-05-05 ENCOUNTER — Ambulatory Visit: Admitting: Physical Therapy

## 2024-05-10 ENCOUNTER — Ambulatory Visit: Admitting: Physical Therapy

## 2024-05-10 ENCOUNTER — Encounter

## 2024-05-10 NOTE — Progress Notes (Unsigned)
 Cardiology Office Note:  .   Date:  05/13/2024  ID:  Alison [name], DOB 1976-01-09, MRN [id number] PCP: Alison Mliss FALCON, FNP  [facility] Providers Cardiologist:  Alison Cave, MD {  History of Present Illness: .   Alison [name] is a 48 y.o. 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  Had small pleural effusion.  Resolved with NSAIDs/ibuprofen .  Palpitations Heart monitor 04/2023, predominantly sinus.  5 runs of SVT, longest 13 beats.  Other Lexiscan  09/2021 low risk. Echocardiogram 02/2023 preserved biventricular function with no significant valvular disease. CT abdomen pelvis 03/2024 with possible cirrhosis, esophageal varices and splenomegaly.   Social history  Currently engaged, has adopted children. Has emotional support dog Iris      Patient with history of pericarditis 02/2023.  She was recently been admitted for metapneumovirus/sepsis 03/2024 with complaints of pleuritic chest pain. Had normal EKG, normal troponins, no pericardial effusion on CT.  Treated with prednisone .  Posthospitalization saw us , continued to have persistent but improved symptoms.  Echocardiogram and ESR/CRP ordered.  ESR chronically elevated 44, CRP normal.  Echocardiogram not completed.  Additionally has admission 04/2024 for complaints of eye pain/headache.  Brain MRI of the brain and orbit concerning for autoimmune process, there was some concern of temporal arteritis versus optic perineuritis.  Biopsy was obtained and was negative for giant cell arteritis. Neurology and vascular surgery were consulted and started on  Solu-Medrol .    Today patient presents for follow-up.  She reports complete resolution of chest pain after starting the steroids.  She has multiple evaluations with rheumatology/neurology to discuss further management of her optic neuritis.  Reports also improved symptoms with eye pain but was having pain bilaterally.  She is also accompanied with her emotional support dog Iris who she reports makes a big difference and helps her from requiring up titration of her psych meds.  Does have any acute cardiac complaints as listed below.  ROS: Denies: Chest pain, shortness of breath, orthopnea, peripheral edema, palpitations, decreased exercise intolerance, fatigue, lightheadedness.   Studies Reviewed: SABRA    EKG Interpretation Date/Time:  Thursday May 13 2024 10:47:13 EDT Ventricular Rate:  74 PR Interval:  138 QRS Duration:  142 QT Interval:  410 QTC Calculation: 455 R Axis:   -28  Text Interpretation: Normal sinus rhythm Right bundle branch block When compared with ECG of 11-Apr-2024 18:04, No significant change was found Confirmed by Darryle Currier [id number] on 05/13/2024 10:51:41 AM    Risk Assessment/Calculations:           Physical Exam:   VS:  BP 130/82 (BP Location: Left Arm, Patient Position: Sitting, Cuff Size: Normal)   Pulse 74   Ht 5' 6 (1.676 m)   Wt 253 lb 4 oz (114.9 kg)   LMP 10/16/2021 (Exact Date) Comment: Seen by OB GYN on 10/16/21 for 6 days of excessive bleeding as a post menapause  SpO2 97%   BMI 40.88 kg/m    Wt Readings from Last 3 Encounters:  05/13/24 253 lb 4 oz (114.9 kg)  05/12/24 253 lb 4.8 oz (114.9 kg)  04/21/24 249 lb (112.9 kg)    GEN: Well nourished, well developed in no acute distress NECK: No JVD; No carotid  bruits CARDIAC: RRR, no murmurs, rubs, gallops, no friction rub RESPIRATORY:  Clear to auscultation without rales, wheezing or rhonchi  ABDOMEN: Soft, non-tender, non-distended EXTREMITIES:  No edema; No deformity   ASSESSMENT AND PLAN: .     Pleuritic chest pain History of pericarditis 02/2023.  Recent admission 03/2024 for metapneumovirus/sepsis.  Symptoms have completely resolved.  She has a chronically elevated, CRP normal.  EKG with no PR depressions/ST changes.  She has not met any criteria which would suggest pericarditis.  So although echocardiogram not completed, no indication for it now.  Palpitations Remote concerns, monitor 04/2023 with predominantly sinus rhythm with rare PACs, PVCs, 5 runs of SVT.  Denies any complaints currently.  Hypertension Reasonable control, 130/82 today.   Previously on lisinopril  20 mg however this has been held. Asked her to keep blood pressure log and to take this daily for the next 1 week.  She reports BP persistently 120-130 systolic at home.  Asked her to follow-up with PCP if these are out of range.  Hyperlipidemia LDL is 57.  This is reasonable given no prior history of any MI/stroke. Continue rosuvastatin  10 mg  Hx of prolonged QT Qtc 455 tday, stable.  Prolongation likely due to underlying right bundle branch block.  Acknowledging the fact that she is on psychiatric meds that can prolong QTc.  Her QTc has been stable and with the help of her emotional support dog has not required further up titration of her medications.  OSA Intermittently compliant with this.  Discussed the importance of compliancy but she reports sometimes this can flareup her trigeminal neuralgia.  She struggles to tolerate this I have asked her to follow-up with PCP for further management as treatment for OSA is paramount for preventing pulmonary hypertension and arrhythmias in the future.  Type 2 diabetes Followed by endocrinology.  Uncontrolled, although A1c does appear to be improved from previous values.  Currently 9.7% 1 month ago.  Optic neuritis Biopsy negative for giant cell arteritis.  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  She has extensive follow-up for this with neurology, rheumatology.   Reports improved symptoms with steroid use.  Dispo: Follow-up in 6 months to ensure there are still no concerns of pericarditis.  Continue to assess for structural changes given concern of progressive rheumatology.  Signed, Thom LITTIE Sluder, PA-C

## 2024-05-11 ENCOUNTER — Telehealth: Payer: Self-pay

## 2024-05-11 DIAGNOSIS — R200 Anesthesia of skin: Secondary | ICD-10-CM | POA: Diagnosis not present

## 2024-05-11 DIAGNOSIS — G5621 Lesion of ulnar nerve, right upper limb: Secondary | ICD-10-CM | POA: Diagnosis not present

## 2024-05-11 DIAGNOSIS — E1122 Type 2 diabetes mellitus with diabetic chronic kidney disease: Secondary | ICD-10-CM | POA: Diagnosis not present

## 2024-05-11 DIAGNOSIS — G5622 Lesion of ulnar nerve, left upper limb: Secondary | ICD-10-CM | POA: Diagnosis not present

## 2024-05-11 DIAGNOSIS — N1831 Chronic kidney disease, stage 3a: Secondary | ICD-10-CM | POA: Diagnosis not present

## 2024-05-11 DIAGNOSIS — R202 Paresthesia of skin: Secondary | ICD-10-CM | POA: Diagnosis not present

## 2024-05-11 NOTE — Telephone Encounter (Signed)
 Copied from CRM [id number]. Topic: Clinical - Medication Question >> May 11, 2024  1:19 PM Pinkey ORN wrote: Reason for CRM: predniSONE  (DELTASONE ) 50 MG tablet >> May 11, 2024  1:20 PM Pinkey ORN wrote: Patient states when released from the hospital she was told to reach out to pcp about getting a taper for her predniSONE  (DELTASONE ) 50 MG tablet. Patient call back number is [phone]

## 2024-05-11 NOTE — Telephone Encounter (Signed)
 Pt will need to schedule an appointment to discuss further with PCP. It has been almost a month since discharged from hospital.

## 2024-05-11 NOTE — Telephone Encounter (Signed)
 Pt is scheduled

## 2024-05-12 ENCOUNTER — Encounter: Payer: Self-pay | Admitting: Nurse Practitioner

## 2024-05-12 ENCOUNTER — Encounter

## 2024-05-12 ENCOUNTER — Ambulatory Visit (INDEPENDENT_AMBULATORY_CARE_PROVIDER_SITE_OTHER): Admitting: Nurse Practitioner

## 2024-05-12 ENCOUNTER — Ambulatory Visit: Admitting: Physical Therapy

## 2024-05-12 ENCOUNTER — Other Ambulatory Visit: Payer: Self-pay

## 2024-05-12 VITALS — BP 132/84 | HR 88 | Temp 99.0°F | Resp 16 | Ht 66.0 in | Wt 253.3 lb

## 2024-05-12 DIAGNOSIS — H469 Unspecified optic neuritis: Secondary | ICD-10-CM | POA: Diagnosis not present

## 2024-05-12 DIAGNOSIS — Z09 Encounter for follow-up examination after completed treatment for conditions other than malignant neoplasm: Secondary | ICD-10-CM

## 2024-05-12 NOTE — Progress Notes (Signed)
 "  BP 132/84 (Cuff Size: Large)   Pulse 88   Temp 99 F (37.2 C) (Oral)   Resp 16   Ht 5' 6 (1.676 m)   Wt 253 lb 4.8 oz (114.9 kg)   LMP 10/16/2021 (Exact Date) Comment: Seen by OB GYN on 10/16/21 for 6 days of excessive bleeding as a post menapause  SpO2 97%   BMI 40.88 kg/m    Subjective:    Patient ID: Alison [name], female    DOB: 1976/05/21, 48 y.o.   MRN: [id number]  HPI: Alison [name] is a 48 y.o. female  Chief Complaint  Patient presents with   Hospitalization Follow-up    Discussed the use of AI scribe software for clinical note transcription with the patient, who gave verbal consent to proceed.  History of Present Illness Alison [name] is a 48 year old female with lupus who presents for a hospital follow-up after admission for eye pain.  Ocular pain and headache - Admitted to hospital on April 11, 2024, for bright right eye pain and headache persisting for approximately ten days - No change in vision, fever, or chills during episode - MRI of brain and orbit showed mild edema within the intraconal fat of the right orbit, compatible with optic perineuritis - Temporal artery biopsy on April 14, 2024, was negative for giant cell arteritis - Ophthalmology evaluation revealed no obvious abnormality - Symptoms have improved since discharge, with occasional eye watering persisting - Able to decrease pain medication while on prednisone   Steroid therapy - Started on Solu-Medrol  on April 12, 2024, during hospitalization - Currently taking prednisone  50 mg daily  Steroid-induced hyperglycemia - Severe hyperglycemia attributed to steroid use - Insulin  regimen escalated: Lantus  38 units at bedtime, Novolog  8 units three times a day before meals - Oral antihyperglycemic agents: metformin  1000 mg twice a day, glipizide  10 mg twice a day, Ozempic  weekly - Blood glucose measured at 101 this morning    Admit date:     04/11/2024  Discharge date: 04/16/2024  Discharge  Physician: Burnard DELENA Cunning    PCP: Gareth Mliss FALCON, FNP    Recommendations at discharge:    Follow up as scheduled with Vascular Surgery for wound/incision check ( referral placed) Follow up with Neurology within 2 weeks  (seen on 04/26/2024) Follow up with Endocrinology for steroid-induced hyperglycemia (seen on 04/21/2024) Follow up with Ophthalmology ( she will call and schedule) Follow up with Primary Care in 1-2 weeks   Discharge Diagnoses: Principal Problem:   Eye pain Active Problems:   Diabetes mellitus without complication (HCC)   S/P liver transplant (HCC)   Chronic pain   Bipolar disorder (manic depression) (HCC)   Morbid obesity (HCC)   Hypertension   Systemic lupus erythematosus (SLE) in adult (HCC)   Trigeminal neuralgia   Migraine   OSA on CPAP   Marginal zone B-cell lymphoma (HCC)   Neuropathic pain   Neuroleptic induced parkinsonism (HCC)   Sjogren's syndrome (HCC)   Ocular pain, right eye   Intractable episodic headache   Optic perineuritis   Steroid-induced hyperglycemia   Resolved Problems:   * No resolved hospital problems. [facility] Course:   Taken from H&P.   Alison [name]  is a 48 y.o. female, with medical history significant for morbid obesity, lymphoma bilateral parotid gland s/p Rituxan  in 2020, diffuse large B-cell lymphoma,  lupus s/p liver transplant, currently on tacrolimus , neuropathy, major depressive disorder, sleep apnea presents to ED secondary  to complaints of right eye pain, and headache for about 10 days.  Denies any change in vision, fever or chills.   ED provider discussed with ophthalmology, they requested MRI of brain and orbit due to concern of autoimmune process, imaging without contrast with concern of temporal arteritis versus optic perineuritis.   Neurology and vascular surgery was consulted and patient was started on Solu-Medrol .   7/7: Vital stable, labs with elevated blood glucose level, likely secondary to  steroid, CRP was normal at 0.6, and mildly elevated ESR which is better than his baseline with history of chronic illnesses is not compatible with temporal arteritis.  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  No evidence of enhancement within the optic nerve.  Pending ophthalmology evaluation. Neurology also ordered lumbar puncture. Continuing steroids for now Temporal artery biopsy is planned for Wednesday with vascular surgery.   7/8: Symptoms improving, CSF culture with no organism, ophthalmology signed off as there was no obvious abnormality. Likely autoimmune with her history of lupus. Neurology is suggesting 3 doses of high-dose steroid followed by prednisone  50 mg daily-need discharge orders for at least 2 weeks and outpatient neurology will follow-up and taper accordingly. CBG remained elevated secondary to high-dose steroid requiring extra insulin .     I assumed care on 04/14/24.   7/9 - temporal artery biopsy done by vascular surgery this AM.  Insulin  regimen escalated due to severe steroid-induced hyperglycemia.       7/11 -- CBG's have improved after significantly escalating insulin  regimen Patient is medically stable and agreeable for discharge home. She has close follow up with her endocrinologist already scheduled.       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  No changes in vision.  Inflammatory markers with mildly elevated ESR and normal CRP are not very consistent with temporal arteritis but in order to completely rule it out vascular surgery was consulted for temporal artery biopsy - done 7/9 AM.   Patient has history of significant autoimmune disorders. S/p LP, preliminary CSF cultures negative --Ophthalmology signed off as there is no ophthalmology  reason, likely autoimmune - follow up outpatient --Neurology signed off -- follow up at [facility] as outpatient --Completed high-dose IV steroid for total 3 doses --Continue prednisone  50 mg daily-will need prescription for at least 2-weeks --Duration and tapering of steroids to be determined at neurology follow up --Temporal artery biopsy done 7/9 AM - follow path results --Outpatient Neurology follow up with Dr. Maree within 2 weeks of d/c --Vascular surgery follow up in 3 weeks to check biopsy site     Diabetes mellitus without complication (HCC) Steroid-induced hyperglycemia CBG elevated likely secondary to steroid use. Appears pt received 154 units total insulin  yesterday and sugars remain severely elevated after 3 doses of high-dose IV steroid and still on prednisone  50 mg. --Continue basal insulin  at increased dose of 40 units BID --Short-actinig 40 units TID with meals +sliding scale --Resume home glipizide , metformin  --Titrate regimen --Follow up closely with Endocrinology     S/P liver transplant (HCC) - Continue home tacrolimus    Chronic pain - Continuing home medications   Bipolar disorder (manic depression) (HCC) - Continue home psych meds   Morbid obesity (HCC) Estimated body mass index is 39.54 kg/m as calculated from the following:   Height as of this  encounter: 5' 6 (1.676 m).   Weight as of this encounter: 111.1 kg.  - This will complicate overall prognosis -Encouraged weight loss   Hypertension Blood pressure currently within goal. Currently home lisinopril  has been held by our patient provider for about a month. - Continue to monitor -Will restart lisinopril  if needed   Marginal zone B-cell lymphoma (HCC) - Outpatient follow-up   OSA on CPAP - CPAP at night       Medication List       PAUSE taking these medications     lisinopril  20 MG tablet Wait to take this until your doctor or other care provider tells you to start again. Commonly  known as: ZESTRIL  TAKE 1 TABLET BY MOUTH ONCE DAILY *PATIENT WILL NEED TO SCHEDULE AN APPOINTMENT FOR ADDITIONAL REFILLS*           TAKE these medications     Accu-Chek Softclix Lancets lancets 2 (two) times daily.    albuterol  108 (90 Base) MCG/ACT inhaler Commonly known as: VENTOLIN  HFA Inhale 2 puffs into the lungs every 6 (six) hours as needed for wheezing or shortness of breath.    AZO CRANBERRY PO Take by mouth.    baclofen  10 MG tablet Commonly known as: LIORESAL  Take 1 tablet by mouth 2 (two) times daily.    benztropine  1 MG tablet Commonly known as: COGENTIN  TAKE 1 TABLET BY MOUTH ONCE DAILY AS NEEDED FOR TREMORS    fluticasone  50 MCG/ACT nasal spray Commonly known as: FLONASE  Place into the nose.    FreeStyle Libre 3 Plus Sensor Misc CHANGE SENSOR EVERY 15 DAYS What changed: See the new instructions.    glipiZIDE  10 MG 24 hr tablet Commonly known as: GLUCOTROL  XL TAKE 1 TABLET BY MOUTH IN THE MORNING AND AT BEDTIME    Insulin  Pen Needle 32G X 6 MM Misc 1 each by Does not apply route as needed.    Lantus  SoloStar 100 UNIT/ML Solostar Pen Generic drug: insulin  glargine Inject 40 Units into the skin 2 (two) times daily. What changed: when to take this    lurasidone  20 MG Tabs tablet Commonly known as: LATUDA  TAKE 1 TABLET BY MOUTH ONCE DAILY WITH SUPPER *REFILL REQUEST*    magnesium  oxide 400 MG tablet Commonly known as: MAG-OX Take 1,000 mg by mouth 2 (two) times daily.    Melatonin 10 MG Tabs Take 10 mg by mouth at bedtime.    metFORMIN  1000 MG tablet Commonly known as: GLUCOPHAGE  TAKE 1 TABLET BY MOUTH TWICE DAILY WITH MEALS *PATIENT WILL NEED TO SCHEDULE APPOINTMENT FOR ADDITIONAL REFILLS*    mirabegron  ER 50 MG Tb24 tablet Commonly known as: Myrbetriq  Take 1 tablet (50 mg total) by mouth daily.    mirtazapine  15 MG tablet Commonly known as: REMERON  TAKE 1 TABLET BY MOUTH EVERY DAY AT BEDTIME *REFILL REQUEST*    naloxone  4 MG/0.1ML Liqd  nasal spray kit Commonly known as: NARCAN  Place 0.4 mg into the nose once.    NovoLOG  FlexPen 100 UNIT/ML FlexPen Generic drug: insulin  aspart Inject 40 Units into the skin 3 (three) times daily with meals. Hold if not eating at least half of a full meal. What changed:  how much to take additional instructions    ondansetron  4 MG tablet Commonly known as: ZOFRAN  Take 1 tablet (4 mg total) by mouth every 6 (six) hours as needed for nausea.    oxyCODONE  5 MG immediate release tablet Commonly known as: Oxy IR/ROXICODONE  Take 5 mg by mouth 3 (  three) times daily as needed.    pantoprazole  40 MG tablet Commonly known as: PROTONIX  Take 1 tablet (40 mg total) by mouth daily. Recommend using while on Prednisone . Start taking on: April 17, 2024    Precision QID Test test strip Generic drug: glucose blood    predniSONE  50 MG tablet Commonly known as: DELTASONE  Take one tablet (50 mg) by mouth once daily until follow up with Neurology. Start taking on: April 17, 2024    pregabalin  200 MG capsule Commonly known as: LYRICA  Take 1 capsule (200 mg total) by mouth 2 (two) times daily.    rosuvastatin  10 MG tablet Commonly known as: Crestor  Take 1 tablet (10 mg total) by mouth daily.    Semaglutide  (1 MG/DOSE) 4 MG/3ML Sopn Inject 1 mg into the skin once a week.    tacrolimus  1 MG capsule Commonly known as: PROGRAF  Take 1 capsule by mouth every morning.    Tylenol  PM Extra Strength 1000-50 MG/30ML Liqd Generic drug: diphenhydrAMINE -APAP (sleep) Take by mouth.    Ubrelvy  50 MG Tabs Generic drug: Ubrogepant  Take 1 tablet by mouth daily as needed.    venlafaxine  XR 75 MG 24 hr capsule Commonly known as: EFFEXOR -XR Take 1 capsule (75 mg total) by mouth daily with breakfast. Take along with 37.5 mg daily    venlafaxine  XR 37.5 MG 24 hr capsule Commonly known as: EFFEXOR -XR Take 37.5 mg by mouth every morning.         \  EXAM: CT HEAD WITHOUT CONTRAST    TECHNIQUE: Contiguous axial images were obtained from the base of the skull through the vertex without intravenous contrast.   RADIATION DOSE REDUCTION: This exam was performed according to the departmental dose-optimization program which includes automated exposure control, adjustment of the mA and/or kV according to patient size and/or use of iterative reconstruction technique.   COMPARISON:  MRI head 05/29/2022.   FINDINGS: Brain: No evidence of acute infarction, hemorrhage, hydrocephalus, extra-axial collection or mass lesion/mass effect. Chronic partially empty sella.   Vascular: No hyperdense vessel or unexpected calcification.   Skull: No acute fracture.   Sinuses/Orbits: Clear sinuses.  No acute orbital finding.   IMPRESSION: No evidence of acute intracranial abnormality.   EXAM: MRI HEAD AND ORBITS WITHOUT CONTRAST   TECHNIQUE: Multiplanar, multi-echo pulse sequences of the brain and surrounding structures were acquired without intravenous contrast. Multiplanar, multi-echo pulse sequences of the orbits and surrounding structures were acquired including fat saturation techniques, without intravenous contrast administration.   COMPARISON:  05/29/2022   FINDINGS: MRI HEAD FINDINGS   Brain: No acute infarct, mass effect or extra-axial collection. No chronic microhemorrhage or siderosis. Normal white matter signal, parenchymal volume and CSF spaces. The midline structures are normal.   Vascular: Normal flow voids.   Skull and upper cervical spine: Normal marrow signal.   Other: None.   MRI ORBITS FINDINGS   Orbits: There is mild edema within the intraconal fat of the right orbit. Otherwise, the orbits are normal.   Visualized sinuses: Small amount of bilateral mastoid fluid. Paranasal sinuses are clear.   Soft tissues: Normal.   IMPRESSION: 1. Mild edema within the intraconal fat of the right orbit, compatible with optic perineuritis. This finding  may also be associated with temporal arteritis. 2. Normal MRI of the brain.   EXAM: MRI HEAD AND ORBITS WITHOUT CONTRAST   TECHNIQUE: Multiplanar, multi-echo pulse sequences of the brain and surrounding structures were acquired without intravenous contrast. Multiplanar, multi-echo pulse sequences of the orbits and  surrounding structures were acquired including fat saturation techniques, without intravenous contrast administration.   COMPARISON:  05/29/2022   FINDINGS: MRI HEAD FINDINGS   Brain: No acute infarct, mass effect or extra-axial collection. No chronic microhemorrhage or siderosis. Normal white matter signal, parenchymal volume and CSF spaces. The midline structures are normal.   Vascular: Normal flow voids.   Skull and upper cervical spine: Normal marrow signal.   Other: None.   MRI ORBITS FINDINGS   Orbits: There is mild edema within the intraconal fat of the right orbit. Otherwise, the orbits are normal.   Visualized sinuses: Small amount of bilateral mastoid fluid. Paranasal sinuses are clear.   Soft tissues: Normal.   IMPRESSION: 1. Mild edema within the intraconal fat of the right orbit, compatible with optic perineuritis. This finding may also be associated with temporal arteritis. 2. Normal MRI of the brain.  EXAM: MRI OF THE ORBITS WITHOUT AND WITH CONTRAST   TECHNIQUE: Multiplanar, multi-echo pulse sequences of the orbits and surrounding structures were acquired including fat saturation techniques, before and after intravenous contrast administration.   CONTRAST:  10mL GADAVIST  GADOBUTROL  1 MMOL/ML IV SOLN   COMPARISON:  MRI head and orbits 04/11/2024.   FINDINGS: Orbits: Globes are intact. Lenses are normally located. No evidence of intra-ocular hemorrhage or mass. Extraocular muscles are unremarkable. Redemonstrated mild edema surrounding the intraorbital right optic nerve particularly along the inferior medial aspect of the nerve.  There is mild associated enhancement along the optic nerve sheath and faint enhancement within the adjacent intraconal fat. There is no evidence of enhancement within the substance of the optic nerve. No abnormal enhancement or edema within the left orbit. Normal appearance of the lacrimal glands. Vascular structures are unremarkable.   Visualized sinuses: Small focus of mucosal thickening within the left sphenoid sinus. No air-fluid levels. Small bilateral mastoid effusions, right greater than left.   Soft tissues: Normal.   Limited intracranial: No acute or significant finding.   IMPRESSION: Redemonstrated edema along the intraorbital right optic nerve with mild enhancement along the optic nerve sheath and within the adjacent intraconal fat suggestive of optic perineuritis.   No evidence of enhancement within the substance of the optic nerve.       04/28/2024    1:49 PM 04/22/2024    3:37 PM 04/01/2024   10:09 AM  Depression screen PHQ 2/9  Decreased Interest  2 2  Down, Depressed, Hopeless  1 2  PHQ - 2 Score  3 4  Altered sleeping   2  Tired, decreased energy   2  Change in appetite   0  Feeling bad or failure about yourself    2  Trouble concentrating   1  Moving slowly or fidgety/restless   0  Suicidal thoughts   0  PHQ-9 Score   11  Difficult doing work/chores   Very difficult     Information is confidential and restricted. Go to Review Flowsheets to unlock data.    Relevant past medical, surgical, family and social history reviewed and updated as indicated. Interim medical history since our last visit reviewed. Allergies and medications reviewed and updated.  Review of Systems  Per HPI unless specifically indicated above     Objective:     BP 132/84 (Cuff Size: Large)   Pulse 88   Temp 99 F (37.2 C) (Oral)   Resp 16   Ht 5' 6 (1.676 m)   Wt 253 lb 4.8 oz (114.9 kg)   LMP 10/16/2021 (Exact Date)  Comment: Seen by OB GYN on 10/16/21 for 6 days of  excessive bleeding as a post menapause  SpO2 97%   BMI 40.88 kg/m    Wt Readings from Last 3 Encounters:  05/13/24 253 lb 4 oz (114.9 kg)  05/12/24 253 lb 4.8 oz (114.9 kg)  04/21/24 249 lb (112.9 kg)    Physical Exam Physical Exam GENERAL: Alert, cooperative, well developed, no acute distress HEENT: Normocephalic, normal oropharynx, moist mucous membranes CHEST: Clear to auscultation bilaterally, No wheezes, rhonchi, or crackles CARDIOVASCULAR: Normal heart rate and rhythm, S1 and S2 normal without murmurs ABDOMEN: Soft, non-tender, non-distended, without organomegaly, Normal bowel sounds EXTREMITIES: No cyanosis or edema NEUROLOGICAL: Cranial nerves grossly intact, Moves all extremities without gross motor or sensory deficit   Results for orders placed or performed during the hospital encounter of 04/11/24  POC CBG, ED   Collection Time: 04/11/24  6:38 PM  Result Value Ref Range   Glucose-Capillary 210 (H) 70 - 99 mg/dL  Comprehensive metabolic panel   Collection Time: 04/11/24  8:08 PM  Result Value Ref Range   Sodium 137 135 - 145 mmol/L   Potassium 4.7 3.5 - 5.1 mmol/L   Chloride 103 98 - 111 mmol/L   CO2 24 22 - 32 mmol/L   Glucose, Bld 230 (H) 70 - 99 mg/dL   BUN 13 6 - 20 mg/dL   Creatinine, Ser 9.07 0.44 - 1.00 mg/dL   Calcium  9.0 8.9 - 10.3 mg/dL   Total Protein 7.1 6.5 - 8.1 g/dL   Albumin 3.6 3.5 - 5.0 g/dL   AST 20 15 - 41 U/L   ALT 16 0 - 44 U/L   Alkaline Phosphatase 64 38 - 126 U/L   Total Bilirubin 0.7 0.0 - 1.2 mg/dL   GFR, Estimated >39 >39 mL/min   Anion gap 10 5 - 15  Lipase, blood   Collection Time: 04/11/24  8:08 PM  Result Value Ref Range   Lipase 64 (H) 11 - 51 U/L  CBC   Collection Time: 04/11/24  8:08 PM  Result Value Ref Range   WBC 9.6 4.0 - 10.5 K/uL   RBC 4.30 3.87 - 5.11 MIL/uL   Hemoglobin 12.2 12.0 - 15.0 g/dL   HCT 62.5 63.9 - 53.9 %   MCV 87.0 80.0 - 100.0 fL   MCH 28.4 26.0 - 34.0 pg   MCHC 32.6 30.0 - 36.0 g/dL   RDW 85.1  88.4 - 84.4 %   Platelets 161 150 - 400 K/uL   nRBC 0.0 0.0 - 0.2 %  Sedimentation rate   Collection Time: 04/11/24  8:08 PM  Result Value Ref Range   Sed Rate 44 (H) 0 - 22 mm/hr  Cytology - Non PAP; CSF   Collection Time: 04/12/24 12:00 AM  Result Value Ref Range   CYTOLOGY - NON GYN      CYTOLOGY - NON PAP [facility] [address], [address] [place], [place] [address] Telephone [phone] or [phone] Fax [phone]  CYTOPATHOLOGY REPORT   Accession #: [id number] Patient Name: Alison [name], Alison [name] Visit # : [id number]  MRN: [id number] Physician: Voncile Isles DOB/Age 16-Aug-1976 (Age: 58) Gender: F Collected Date: 04/12/2024 Received Date: 04/12/2024  FINAL DIAGNOSIS STATEMENT Of SPECIMEN ADEQUACY:  INTERPRETATION(S):      NEGATIVE FOR MALIGNANCY      RARE LYMPHOCYTES, MONOCYTES AND RED BLOOD CELLS, FAVOR PERIPHERAL BLOOD      CONTAMINATION      DATE SIGNED OUT:  04/13/2024 ELECTRONIC SIGNATURE : Pepper Dutton Md, Pathologist, Electronic Signature   CASE COMMENTS   CLINICAL HISTORY  SOURCE OF SPECIMEN(S) Cerebrospinal Fluid (CSF)  SPECIMEN COMMENTS: SPECIMEN CLINICAL INFORMATION:    Gross Description Specimen: Received is/are 2cc of clear fluid in a sterile vial and one slide      Prepared:      # Smears:  1      # Concentration Technique Slides (i.e. ThinPrep): 1      # Cell Block: QNS      # Diff-Quick Stain: 0        Report signed out from the following location(s) [place] [address] [place], [place] [address] CLIA #: [id number]  [facility] [address] [place], [place] [address] CLIA #: [id number]   CBG monitoring, ED   Collection Time: 04/12/24 12:47 AM  Result Value Ref Range   Glucose-Capillary 234 (H) 70 - 99 mg/dL  C-reactive protein   Collection Time: 04/12/24  1:05 AM  Result Value Ref Range   CRP 0.6 <1.0 mg/dL  Comprehensive metabolic panel   Collection  Time: 04/12/24  5:00 AM  Result Value Ref Range   Sodium 136 135 - 145 mmol/L   Potassium 4.5 3.5 - 5.1 mmol/L   Chloride 102 98 - 111 mmol/L   CO2 24 22 - 32 mmol/L   Glucose, Bld 242 (H) 70 - 99 mg/dL   BUN 14 6 - 20 mg/dL   Creatinine, Ser 9.11 0.44 - 1.00 mg/dL   Calcium  9.3 8.9 - 10.3 mg/dL   Total Protein 7.1 6.5 - 8.1 g/dL   Albumin 3.5 3.5 - 5.0 g/dL   AST 19 15 - 41 U/L   ALT 15 0 - 44 U/L   Alkaline Phosphatase 67 38 - 126 U/L   Total Bilirubin 1.0 0.0 - 1.2 mg/dL   GFR, Estimated >39 >39 mL/min   Anion gap 10 5 - 15  CBC   Collection Time: 04/12/24  5:00 AM  Result Value Ref Range   WBC 8.0 4.0 - 10.5 K/uL   RBC 4.18 3.87 - 5.11 MIL/uL   Hemoglobin 11.9 (L) 12.0 - 15.0 g/dL   HCT 63.6 63.9 - 53.9 %   MCV 86.8 80.0 - 100.0 fL   MCH 28.5 26.0 - 34.0 pg   MCHC 32.8 30.0 - 36.0 g/dL   RDW 85.1 88.4 - 84.4 %   Platelets 163 150 - 400 K/uL   nRBC 0.0 0.0 - 0.2 %  CBG monitoring, ED   Collection Time: 04/12/24  8:08 AM  Result Value Ref Range   Glucose-Capillary 339 (H) 70 - 99 mg/dL  CBG monitoring, ED   Collection Time: 04/12/24 11:29 AM  Result Value Ref Range   Glucose-Capillary 363 (H) 70 - 99 mg/dL  CSF culture w Gram Stain   Collection Time: 04/12/24 12:53 PM   Specimen: PATH Cytology CSF; Cerebrospinal Fluid  Result Value Ref Range   Specimen Description      CSF Performed at [facility], [address] [place], [place] [address]    Special Requests      3 Performed at [facility], [address] [place], [place] [address]    Gram Stain      NO ORGANISMS SEEN WBC SEEN RED BLOOD CELLS Performed at [facility], [address] [place], [place] [address]    Culture      NO GROWTH 3 DAYS Performed  at [facility], [address] [place], [place] [address]    Report Status 04/16/2024 FINAL   Protein and glucose, CSF   Collection Time: 04/12/24 12:53 PM  Result Value Ref Range   Glucose, CSF 145 (H) 40 -  70 mg/dL   Total  Protein, CSF 60 (H) 15 - 45 mg/dL  CSF cell count with differential collection tube #: 1   Collection Time: 04/12/24 12:53 PM  Result Value Ref Range   Tube # 1    Color, CSF COLORLESS COLORLESS   Appearance, CSF CLEAR CLEAR   Supernatant NOT INDICATED    RBC Count, CSF 29 (H) 0 - 3 /cu mm   WBC, CSF 5 0 - 5 /cu mm   Other Cells, CSF TOO FEW TO COUNT, SMEAR AVAILABLE FOR REVIEW   CSF cell count with differential collection tube #: 4   Collection Time: 04/12/24 12:53 PM  Result Value Ref Range   Tube # 4    Color, CSF COLORLESS COLORLESS   Appearance, CSF CLEAR CLEAR   Supernatant NOT INDICATED    RBC Count, CSF 13 (H) 0 - 3 /cu mm   WBC, CSF 3 0 - 5 /cu mm   Other Cells, CSF TOO FEW TO COUNT, SMEAR AVAILABLE FOR REVIEW   Flow cytometry panel-leukemia/lymphoma work-up   Collection Time: 04/12/24 12:53 PM  Result Value Ref Range   PATH INTERP XXX-IMP Comment    CLINICAL INFO Comment    Specimen Type Comment    ASSESSMENT OF LEUKOCYTES Comment    % Viable Cells Comment    ANALYSIS AND GATING STRATEGY Comment    IMMUNOPHENOTYPING STUDY Comment    PATHOLOGIST NAME Comment    COMMENT: Comment   Meningitis/Encephalitis Panel (CSF)   Collection Time: 04/12/24 12:53 PM  Result Value Ref Range   Cryptococcus neoformans/gattii (CSF) NOT DETECTED NOT DETECTED   Cytomegalovirus (CSF) NOT DETECTED NOT DETECTED   Enterovirus (CSF) NOT DETECTED NOT DETECTED   Escherichia coli K1 (CSF) NOT DETECTED NOT DETECTED   Haemophilus influenzae (CSF) NOT DETECTED NOT DETECTED   Herpes simplex virus 1 (CSF) NOT DETECTED NOT DETECTED   Herpes simplex virus 2 (CSF) NOT DETECTED NOT DETECTED   Human herpesvirus 6 (CSF) NOT DETECTED NOT DETECTED   Human parechovirus (CSF) NOT DETECTED NOT DETECTED   Listeria monocytogenes (CSF) NOT DETECTED NOT DETECTED   Neisseria meningitis (CSF) NOT DETECTED NOT DETECTED   Streptococcus agalactiae (CSF) NOT DETECTED NOT DETECTED   Streptococcus  pneumoniae (CSF) NOT DETECTED NOT DETECTED   Varicella zoster virus (CSF) NOT DETECTED NOT DETECTED  Glucose, capillary   Collection Time: 04/12/24  5:21 PM  Result Value Ref Range   Glucose-Capillary 292 (H) 70 - 99 mg/dL  Glucose, capillary   Collection Time: 04/12/24  9:23 PM  Result Value Ref Range   Glucose-Capillary 401 (H) 70 - 99 mg/dL  Glucose, capillary   Collection Time: 04/12/24  9:49 PM  Result Value Ref Range   Glucose-Capillary 391 (H) 70 - 99 mg/dL  CBC   Collection Time: 04/13/24  4:06 AM  Result Value Ref Range   WBC 9.3 4.0 - 10.5 K/uL   RBC 4.15 3.87 - 5.11 MIL/uL   Hemoglobin 11.7 (L) 12.0 - 15.0 g/dL   HCT 64.8 (L) 63.9 - 53.9 %   MCV 84.6 80.0 - 100.0 fL   MCH 28.2 26.0 - 34.0 pg   MCHC 33.3 30.0 - 36.0 g/dL   RDW 85.4 88.4 -  15.5 %   Platelets 185 150 - 400 K/uL   nRBC 0.0 0.0 - 0.2 %  Basic metabolic panel with GFR   Collection Time: 04/13/24  4:06 AM  Result Value Ref Range   Sodium 132 (L) 135 - 145 mmol/L   Potassium 4.5 3.5 - 5.1 mmol/L   Chloride 95 (L) 98 - 111 mmol/L   CO2 23 22 - 32 mmol/L   Glucose, Bld 337 (H) 70 - 99 mg/dL   BUN 27 (H) 6 - 20 mg/dL   Creatinine, Ser 8.87 (H) 0.44 - 1.00 mg/dL   Calcium  9.3 8.9 - 10.3 mg/dL   GFR, Estimated 60 (L) >60 mL/min   Anion gap 14 5 - 15  Glucose, capillary   Collection Time: 04/13/24  7:26 AM  Result Value Ref Range   Glucose-Capillary 380 (H) 70 - 99 mg/dL   Comment 1 Notify RN   Glucose, capillary   Collection Time: 04/13/24 11:51 AM  Result Value Ref Range   Glucose-Capillary 485 (H) 70 - 99 mg/dL   Comment 1 Notify RN   Glucose, capillary   Collection Time: 04/13/24 12:55 PM  Result Value Ref Range   Glucose-Capillary 477 (H) 70 - 99 mg/dL   Comment 1 Notify RN   Glucose, capillary   Collection Time: 04/13/24  2:15 PM  Result Value Ref Range   Glucose-Capillary 473 (H) 70 - 99 mg/dL   Comment 1 Notify RN   Glucose, capillary   Collection Time: 04/13/24  4:14 PM  Result  Value Ref Range   Glucose-Capillary 434 (H) 70 - 99 mg/dL  Glucose, capillary   Collection Time: 04/13/24  6:01 PM  Result Value Ref Range   Glucose-Capillary 378 (H) 70 - 99 mg/dL  Glucose, capillary   Collection Time: 04/13/24  7:24 PM  Result Value Ref Range   Glucose-Capillary 319 (H) 70 - 99 mg/dL  Type and screen   Collection Time: 04/13/24  8:53 PM  Result Value Ref Range   ABO/RH(D) O POS    Antibody Screen POS    Sample Expiration 04/16/2024,2359    Antibody Identification NON SPECIFIC ANTIBODY REACTIVITY    DAT, IgG NEG    DAT, complement POS    Unit Number [id number]    Blood Component Type RBC LR PHER2    Unit division 00    Status of Unit REL FROM [facility]    Transfusion Status OK TO TRANSFUSE    Crossmatch Result COMPATIBLE    Unit Number [id number]    Blood Component Type RED CELLS,LR    Unit division 00    Status of Unit REL FROM [facility]    Transfusion Status OK TO TRANSFUSE    Crossmatch Result COMPATIBLE   BPAM RBC   Collection Time: 04/13/24  8:53 PM  Result Value Ref Range   Blood Product Unit Number [id number]    PRODUCT CODE [id number]    Unit Type and Rh 5100    Blood Product Expiration Date 797491967640    Blood Product Unit Number [id number]    PRODUCT CODE [id number]    Unit Type and Rh 5100    Blood Product Expiration Date 797491967640   Surgical pathology   Collection Time: 04/14/24 12:00 AM  Result Value Ref Range   SURGICAL PATHOLOGY      SURGICAL PATHOLOGY [facility] [address] [place], [place] [address] Telephone [phone] or [phone] Fax [phone]  REPORT OF SURGICAL PATHOLOGY   Accession #:  [id number] Patient Name: Alison [name], Alison [name] Visit # : [id number]  MRN: [id number] Physician: Marea Selinda EDISON DOB/Age July 02, 1976 (Age: 43) Gender: F Collected Date: 04/14/2024 Received Date: 04/14/2024  FINAL DIAGNOSIS       1. Artery, temporal, biopsy, right :       UNREMARKABLE ARTERY.       NEGATIVE FOR ACTIVE ARTERITIS.      SEE NOTE.       Diagnosis Note : The entire specimen is serially sectioned and submitted for      histologic examination and shows artery with normal intima, media and      adventitia.  No active arteritis is identified.      DATE SIGNED OUT: 04/15/2024 ELECTRONIC SIGNATURE : Belvie Come, John, Pathologist, Electronic Signature  MICROSCOPIC DESCRIPTION  CASE COMMENTS STAINS USED IN DIAGNOSIS: H&E H&E    CLINICAL HISTORY  SP ECIMEN(S) OBTAINED 1. Artery, temporal, biopsy, Right  SPECIMEN COMMENTS: SPECIMEN CLINICAL INFORMATION: 1. Optic perineuritis    Gross Description 1. Right temporal artery, received fresh and placed in formalin is a single 2.0 cm long, partially disrupted portion of red-pink, tubular tissue with a diameter up to 0.2 cm. The specimen is serially sectioned and submitted entirely in 2 blocks (1A-B).      AMG 04/14/2024        Report signed out from the following location(s) [place] [address] [place], [place] [address] CLIA #: [id number]  [facility] [address] [place], [place] [address] CLIA #: [id number]   Glucose, capillary   Collection Time: 04/14/24  7:03 AM  Result Value Ref Range   Glucose-Capillary 288 (H) 70 - 99 mg/dL  Glucose, capillary   Collection Time: 04/14/24  8:43 AM  Result Value Ref Range   Glucose-Capillary 305 (H) 70 - 99 mg/dL  Glucose, capillary   Collection Time: 04/14/24 10:36 AM  Result Value Ref Range   Glucose-Capillary 340 (H) 70 - 99 mg/dL  Glucose, capillary   Collection Time: 04/14/24 12:52 PM  Result Value Ref Range   Glucose-Capillary 330 (H) 70 - 99 mg/dL  Glucose, capillary   Collection Time: 04/14/24  5:10 PM  Result Value Ref Range   Glucose-Capillary 377 (H) 70 - 99 mg/dL  Glucose, capillary   Collection Time: 04/14/24  6:34 PM  Result Value Ref Range   Glucose-Capillary 468 (H) 70 - 99 mg/dL   Glucose, capillary   Collection Time: 04/14/24  8:36 PM  Result Value Ref Range   Glucose-Capillary 451 (H) 70 - 99 mg/dL  Glucose, capillary   Collection Time: 04/14/24 10:04 PM  Result Value Ref Range   Glucose-Capillary 434 (H) 70 - 99 mg/dL  Glucose, random   Collection Time: 04/14/24 10:37 PM  Result Value Ref Range   Glucose, Bld 436 (H) 70 - 99 mg/dL  Glucose, capillary   Collection Time: 04/15/24 12:24 AM  Result Value Ref Range   Glucose-Capillary 345 (H) 70 - 99 mg/dL  Glucose, capillary   Collection Time: 04/15/24  8:04 AM  Result Value Ref Range   Glucose-Capillary 191 (H) 70 - 99 mg/dL  Glucose, capillary   Collection Time: 04/15/24 11:30 AM  Result Value Ref Range   Glucose-Capillary 327 (H) 70 - 99 mg/dL  Glucose, capillary   Collection Time: 04/15/24  3:46 PM  Result Value Ref Range   Glucose-Capillary 327 (H) 70 - 99 mg/dL  Glucose, capillary   Collection Time: 04/15/24  8:07 PM  Result Value  Ref Range   Glucose-Capillary 310 (H) 70 - 99 mg/dL  Glucose, capillary   Collection Time: 04/16/24  8:10 AM  Result Value Ref Range   Glucose-Capillary 116 (H) 70 - 99 mg/dL  Glucose, capillary   Collection Time: 04/16/24 12:19 PM  Result Value Ref Range   Glucose-Capillary 169 (H) 70 - 99 mg/dL  Glucose, capillary   Collection Time: 04/16/24  3:47 PM  Result Value Ref Range   Glucose-Capillary 176 (H) 70 - 99 mg/dL  Glucose, capillary   Collection Time: 04/16/24  4:45 PM  Result Value Ref Range   Glucose-Capillary 141 (H) 70 - 99 mg/dL          Assessment & Plan:   Problem List Items Addressed This Visit       Nervous and Auditory   Optic perineuritis - Primary   Relevant Orders   Ambulatory referral to Vascular Surgery   Other Visit Diagnoses       Hospital discharge follow-up       Relevant Orders   Ambulatory referral to Vascular Surgery        Assessment and Plan Assessment & Plan Optic perineuritis, 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 Symptoms include intermittent eye watering. Neurology recommends continuation of prednisone  50 mg daily for at least one month. She understands the chronic nature and potential for incomplete resolution. - Call neurology to confirm prednisone  tapering schedule. -heard back from neurology they will work with patient regarding tapering.   - Instruct her to schedule a follow-up with ophthalmology for re-evaluation.  Steroid-induced hyperglycemia in patient with type 2 diabetes mellitus Steroid-induced hyperglycemia secondary to high-dose prednisone  therapy. Blood glucose levels managed with escalated insulin  regimen. Current blood glucose level is 101 mg/dL, indicating good control.  Systemic lupus erythematosus (SLE) Systemic lupus erythematosus contributing to autoimmune complications such as optic perineuritis. No current exacerbation of lupus symptoms reported.        Follow up plan: Return if symptoms worsen or fail to improve.      "

## 2024-05-13 ENCOUNTER — Ambulatory Visit: Attending: Nurse Practitioner | Admitting: Cardiology

## 2024-05-13 ENCOUNTER — Encounter: Payer: Self-pay | Admitting: Nurse Practitioner

## 2024-05-13 VITALS — BP 130/82 | HR 74 | Ht 66.0 in | Wt 253.2 lb

## 2024-05-13 DIAGNOSIS — Z794 Long term (current) use of insulin: Secondary | ICD-10-CM | POA: Diagnosis not present

## 2024-05-13 DIAGNOSIS — I10 Essential (primary) hypertension: Secondary | ICD-10-CM | POA: Insufficient documentation

## 2024-05-13 DIAGNOSIS — E119 Type 2 diabetes mellitus without complications: Secondary | ICD-10-CM | POA: Insufficient documentation

## 2024-05-13 DIAGNOSIS — G4733 Obstructive sleep apnea (adult) (pediatric): Secondary | ICD-10-CM | POA: Insufficient documentation

## 2024-05-13 DIAGNOSIS — R0781 Pleurodynia: Secondary | ICD-10-CM | POA: Diagnosis not present

## 2024-05-13 DIAGNOSIS — R9431 Abnormal electrocardiogram [ECG] [EKG]: Secondary | ICD-10-CM | POA: Diagnosis not present

## 2024-05-13 DIAGNOSIS — R002 Palpitations: Secondary | ICD-10-CM | POA: Insufficient documentation

## 2024-05-13 NOTE — Patient Instructions (Signed)
 Medication Instructions:  Your physician recommends that you continue on your current medications as directed. Please refer to the Current Medication list given to you today.   *If you need a refill on your cardiac medications before your next appointment, please call your pharmacy*  Lab Work: None ordered at this time   Follow-Up: At [facility], you and your health needs are our priority.  As part of our continuing mission to provide you with exceptional heart care, our providers are all part of one team.  This team includes your primary Cardiologist (physician) and Advanced Practice Providers or APPs (Physician Assistants and Nurse Practitioners) who all work together to provide you with the care you need, when you need it.  Your next appointment:   6 month(s)  Provider:   You may see Redell Cave, MD

## 2024-05-14 ENCOUNTER — Telehealth: Payer: Self-pay | Admitting: Nurse Practitioner

## 2024-05-14 NOTE — Telephone Encounter (Signed)
 Copied from CRM [id number]. Topic: Clinical - Medication Refill >> May 14, 2024  9:29 AM Rosaria E wrote: Medication: predniSONE  (DELTASONE ) 50 MG tablet (and then Taper down by 5 MG a week per neurologist)   Has the patient contacted their pharmacy? Yes (Agent: If no, request that the patient contact the pharmacy for the refill. If patient does not wish to contact the pharmacy document the reason why and proceed with request.) (Agent: If yes, when and what did the pharmacy advise?)  This is the patient's preferred pharmacy:   [facility] [address] [place] - [address] [place] [address] Phone: [phone] Fax: [phone]  Is this the correct pharmacy for this prescription? Yes If no, delete pharmacy and type the correct one.   Has the prescription been filled recently? Yes  Is the patient out of the medication? 4 pills left   Has the patient been seen for an appointment in the last year OR does the patient have an upcoming appointment? Yes  Can we respond through MyChart? Yes  Agent: Please be advised that Rx refills may take up to 3 business days. We ask that you follow-up with your pharmacy.

## 2024-05-17 ENCOUNTER — Encounter

## 2024-05-17 ENCOUNTER — Encounter (INDEPENDENT_AMBULATORY_CARE_PROVIDER_SITE_OTHER): Payer: Self-pay | Admitting: Nurse Practitioner

## 2024-05-17 ENCOUNTER — Ambulatory Visit (INDEPENDENT_AMBULATORY_CARE_PROVIDER_SITE_OTHER): Admitting: Nurse Practitioner

## 2024-05-17 ENCOUNTER — Ambulatory Visit: Admitting: Physical Therapy

## 2024-05-17 VITALS — BP 161/96 | HR 95 | Resp 18 | Ht 66.0 in | Wt 256.6 lb

## 2024-05-17 DIAGNOSIS — R519 Headache, unspecified: Secondary | ICD-10-CM

## 2024-05-17 NOTE — Progress Notes (Signed)
 Subjective:    Patient ID: Alison [name], female    DOB: 22-Jun-1976, 48 y.o.   MRN: [id number] Chief Complaint  Patient presents with   New Patient (Initial Visit)    [facility] 3 week follow up    The patient presents today following a right temporal artery biopsy on 04/14/2024.she presented to [facility] due to headache that was unrelenting.  It was mainly behind her right eye which started as a dull ache but has progressed and currently feels as if somebody has punched her in her heart and is constantly aching.  She was sensitive to lights.  She also had pain with eye movement.  The discomfort has improved but it is not completely resolved.  She has been undergoing treatment by her PCP and neurology in order to try to help her ongoing headaches.    Review of Systems  Skin:  Positive for wound.  Neurological:  Positive for headaches.  All other systems reviewed and are negative.      Objective:   Physical Exam Vitals reviewed.  HENT:     Head: Normocephalic.  Cardiovascular:     Rate and Rhythm: Normal rate.  Pulmonary:     Effort: Pulmonary effort is normal.  Skin:    General: Skin is warm and dry.     Findings: No bruising.  Neurological:     Mental Status: She is alert and oriented to person, place, and time.  Psychiatric:        Mood and Affect: Mood normal.        Behavior: Behavior normal.        Thought Content: Thought content normal.        Judgment: Judgment normal.     BP (!) 161/96   Pulse 95   Resp 18   Ht 5' 6 (1.676 m)   Wt 256 lb 9.6 oz (116.4 kg)   LMP 10/16/2021 (Exact Date) Comment: Seen by OB GYN on 10/16/21 for 6 days of excessive bleeding as a post menapause  BMI 41.42 kg/m   Past Medical History:  Diagnosis Date   Abnormal uterine bleeding    Allergy    Anxiety    Arthritis    Bipolar disorder (manic depression) (HCC)    Cancer (HCC) Diffuse B-Cell Lymphoma   2015   Chest pain    a. 09/2021 MV: Low risk without  ischemia or infarct.  Normal EF.   Chronic kidney failure    Chronic renal disease, stage III (HCC)    Diabetes mellitus without complication (HCC)    DLBCL (diffuse large B cell lymphoma) (HCC) 2015   Right axillary lymph node resected and chemo tx's.   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 02/2023 Echo: EF 55-60%, no rwma, nl RV size/fxn. Mildly dil LA.   Personality disorder (HCC)    Pineal gland cyst    Post herpetic neuralgia  PTSD (post-traumatic stress disorder)    RBBB    Renal disorder    S/P liver transplant (HCC) 1993   Sleep apnea    has not recieved cpap yet-other one was recalled    Social History   Socioeconomic History   Marital status: Single    Spouse name: Not on file   Number of children: 0   Years of education: 9   Highest education level: GED or equivalent  Occupational History   Occupation: disabled  Tobacco Use   Smoking status: Never    Passive exposure: Past   Smokeless tobacco: Never   Tobacco comments:    Occasional CBD oil  Vaping Use   Vaping status: Never Used  Substance and Sexual Activity   Alcohol use: Not on file   Drug use: Not Currently    Comment: pain managment last used in early april   Sexual activity: Not Currently  Other Topics Concern   Not on file  Social History Narrative   Not on file   Social Drivers of Health   Financial Resource Strain: Low Risk  (04/01/2024)   Overall Financial Resource Strain (CARDIA)    Difficulty of Paying Living Expenses: Not very hard  Food Insecurity: No Food Insecurity  (04/22/2024)   Hunger Vital Sign    Worried About Running Out of Food in the Last Year: Never true    Ran Out of Food in the Last Year: Never true  Transportation Needs: No Transportation Needs (04/22/2024)   PRAPARE - Administrator, Civil Service (Medical): No    Lack of Transportation (Non-Medical): No  Physical Activity: Inactive (04/01/2024)   Exercise Vital Sign    Days of Exercise per Week: 0 days    Minutes of Exercise per Session: 0 min  Stress: Stress Concern Present (04/01/2024)   [employer] of Occupational Health - Occupational Stress Questionnaire    Feeling of Stress: To some extent  Social Connections: Unknown (04/12/2024)   Social Connection and Isolation Panel    Frequency of Communication with Friends and Family: Patient declined    Frequency of Social Gatherings with Friends and Family: Patient declined    Attends Religious Services: Patient declined    Active Member of Clubs or Organizations: Patient declined    Attends Banker Meetings: Patient declined    Marital Status: Living with partner  Intimate Partner Violence: Not At Risk (04/22/2024)   Humiliation, Afraid, Rape, and Kick questionnaire    Fear of Current or Ex-Partner: No    Emotionally Abused: No    Physically Abused: No    Sexually Abused: No    Past Surgical History:  Procedure Laterality Date   ARTERY BIOPSY Right 04/14/2024   Procedure: BIOPSY TEMPORAL ARTERY;  Surgeon: Marea Selinda RAMAN, MD;  Location: [facility] ORS;  Service: General;  Laterality: Right;   BONE MARROW BIOPSY  01/13/2015   BREAST BIOPSY Right 12/2014   US  Axilla Bx, Positive for Lymphoma   BREAST BIOPSY Right 2011   benign   BREAST BIOPSY Right 2023   Stereo bx, X-clip, Fat Necrosis   BREAST BIOPSY Right 01/20/2024   US  RT BREAST BX W LOC DEV 1ST LESION IMG BX SPEC US  GUIDE 01/20/2024 [facility]   BREAST SURGERY Right 2016   Lymphoma, lymph node removal. pt then got a seroma and had to do more surgery    CHOLECYSTECTOMY     COLONOSCOPY     COLONOSCOPY WITH PROPOFOL  N/A 02/28/2022   Procedure:  COLONOSCOPY WITH PROPOFOL ;  Surgeon: Jinny Carmine, MD;  Location: [facility] ENDOSCOPY;  Service: Endoscopy;  Laterality: N/A;   ESOPHAGOGASTRODUODENOSCOPY     ESOPHAGOGASTRODUODENOSCOPY (EGD) WITH PROPOFOL  N/A 01/09/2021   Procedure: ESOPHAGOGASTRODUODENOSCOPY (EGD) WITH PROPOFOL ;  Surgeon: Jinny Carmine, MD;  Location: [facility] ENDOSCOPY;  Service: Endoscopy;  Laterality: N/A;   HERNIA REPAIR     x4-all from liver transplant   HYSTEROSCOPY WITH D & C N/A 11/13/2021   Procedure: DILATATION AND CURETTAGE /HYSTEROSCOPY;  Surgeon: Victor Claudell SAUNDERS, MD;  Location: [facility] ORS;  Service: Gynecology;  Laterality: N/A;   infusaport     LIVER TRANSPLANT  12/17/1991   LUMBAR PUNCTURE     PORT A CATH INJECTION ([facility] HX)     RENAL BIOPSY     tumor removal  2015   cancer right side    Family History  Problem Relation Age of Onset   Heart disease Mother    Hypertension Mother    Cancer - Other Mother    Bipolar disorder Mother    Anxiety disorder Mother    Arthritis Mother    Depression Mother    Heart disease Father    Hypertension Father    Diabetes Father    Parkinson's disease Maternal Grandmother    COPD Maternal Grandmother    Cancer Maternal Aunt    Cancer Maternal Uncle    Cancer Maternal Grandfather    Lupus Paternal Grandmother    Hypertension Brother    Alcohol abuse Brother    Depression Brother    Alcohol abuse Maternal Uncle     Allergies  Allergen Reactions   Penicillin G Itching, Rash, Anaphylaxis and Palpitations   Lamictal  [Lamotrigine ] Rash   Tape    Carbamazepine Other (See Comments)    Medication interaction-prograf     Hydrocodone-Acetaminophen  Itching   Naproxen Itching   Penicillins    Vancomycin  Rash    Macular/blotchy rash at infusion site       Latest Ref Rng & Units 04/13/2024    4:06 AM 04/12/2024    5:00 AM 04/11/2024    8:08 PM  CBC  WBC 4.0 - 10.5 K/uL 9.3   8.0  9.6   Hemoglobin 12.0 - 15.0 g/dL 88.2  88.0  87.7   Hematocrit 36.0 - 46.0 % 35.1  36.3  37.4   Platelets 150 - 400 K/uL 185  163  161       CMP     Component Value Date/Time   NA 132 (L) 04/13/2024 0406   K 4.5 04/13/2024 0406   CL 95 (L) 04/13/2024 0406   CO2 23 04/13/2024 0406   GLUCOSE 436 (H) 04/14/2024 2237   BUN 27 (H) 04/13/2024 0406   CREATININE 1.12 (H) 04/13/2024 0406   CREATININE 1.14 (H) 04/01/2024 1043   CALCIUM  9.3 04/13/2024 0406   CALCIUM  7.6 (L) 06/27/2022 1448   PROT 7.1 04/12/2024 0500   ALBUMIN 3.5 04/12/2024 0500   AST 19 04/12/2024 0500   AST 22 03/03/2024 0958   ALT 15 04/12/2024 0500   ALT 19 03/03/2024 0958   ALKPHOS 67 04/12/2024 0500   BILITOT 1.0 04/12/2024 0500   BILITOT 0.5 03/03/2024 0958   EGFR 59 (L) 04/01/2024 1043   GFRNONAA 60 (L) 04/13/2024 0406   GFRNONAA >60 03/03/2024 0958   GFRNONAA 64 12/11/2020 0000     No results found.     Assessment & Plan:   1. Intractable episodic headache, unspecified headache type (Primary) Pathology report shows no evidence of active  arteritis in the temporal artery.  Her wound is healing well without any significant evidence of infection or erythema.  This should continue to heal without incident.  At this time the patient is to continue to follow with neurology and PCP for ongoing treatment of her headaches.  Patient will follow-up on an as-needed basis.   Current Outpatient Medications on File Prior to Visit  Medication Sig Dispense Refill   Accu-Chek Softclix Lancets lancets 2 (two) times daily.     albuterol  (VENTOLIN  HFA) 108 (90 Base) MCG/ACT inhaler Inhale 2 puffs into the lungs every 6 (six) hours as needed for wheezing or shortness of breath. 8 g 0   baclofen  (LIORESAL ) 10 MG tablet Take 1 tablet by mouth 2 (two) times daily.     benztropine  (COGENTIN ) 1 MG tablet TAKE 1 TABLET BY MOUTH ONCE DAILY AS NEEDED FOR TREMORS 30 tablet 10   Continuous Glucose Sensor (FREESTYLE LIBRE 3  PLUS SENSOR) MISC CHANGE SENSOR EVERY 15 DAYS 6 each 3   Cranberry-Vitamin C-Probiotic (AZO CRANBERRY PO) Take by mouth.     fluticasone  (FLONASE ) 50 MCG/ACT nasal spray Place into the nose.     glipiZIDE  (GLUCOTROL  XL) 10 MG 24 hr tablet TAKE 1 TABLET BY MOUTH IN THE MORNING AND AT BEDTIME 60 tablet 5   glucose blood (RELION GLUCOSE TEST STRIPS) test strip Use as instructed 100 each 12   insulin  glargine (LANTUS  SOLOSTAR) 100 UNIT/ML Solostar Pen Inject 40 Units into the skin 2 (two) times daily. 15 mL 0   [Paused] lisinopril  (ZESTRIL ) 20 MG tablet TAKE 1 TABLET BY MOUTH ONCE DAILY *PATIENT WILL NEED TO SCHEDULE AN APPOINTMENT FOR ADDITIONAL REFILLS* 90 tablet 0   lurasidone  (LATUDA ) 20 MG TABS tablet TAKE 1 TABLET BY MOUTH ONCE DAILY WITH SUPPER *REFILL REQUEST* 90 tablet 10   magnesium  oxide (MAG-OX) 400 MG tablet Take 1,000 mg by mouth 2 (two) times daily.     Melatonin 10 MG TABS Take 10 mg by mouth at bedtime.     metFORMIN  (GLUCOPHAGE ) 1000 MG tablet TAKE 1 TABLET BY MOUTH TWICE DAILY WITH MEALS *PATIENT WILL NEED TO SCHEDULE APPOINTMENT FOR ADDITIONAL REFILLS* 180 tablet 0   mirabegron  ER (MYRBETRIQ ) 50 MG TB24 tablet Take 1 tablet (50 mg total) by mouth daily. 90 tablet 3   mirtazapine  (REMERON ) 15 MG tablet TAKE 1 TABLET BY MOUTH AT BEDTIME 90 tablet 11   naloxone  (NARCAN ) nasal spray 4 mg/0.1 mL Place 0.4 mg into the nose once.     NOVOLOG  FLEXPEN 100 UNIT/ML FlexPen Inject 40 Units into the skin 3 (three) times daily with meals. Hold if not eating at least half of a full meal. 15 mL 11   ondansetron  (ZOFRAN ) 4 MG tablet Take 1 tablet (4 mg total) by mouth every 6 (six) hours as needed for nausea. 20 tablet 0   oxyCODONE  (OXY IR/ROXICODONE ) 5 MG immediate release tablet Take 5 mg by mouth 3 (three) times daily as needed.     pantoprazole  (PROTONIX ) 40 MG tablet Take 1 tablet (40 mg total) by mouth daily. Recommend using while on Prednisone . 30 tablet 0   PRECISION QID TEST test strip       predniSONE  (DELTASONE ) 50 MG tablet Take one tablet (50 mg) by mouth once daily until follow up with Neurology. 30 tablet 0   pregabalin  (LYRICA ) 200 MG capsule Take 1 capsule (200 mg total) by mouth 2 (two) times daily. 60 capsule 0   rosuvastatin  (CRESTOR ) 10 MG tablet  Take 1 tablet (10 mg total) by mouth daily. 90 tablet 2   Semaglutide , 2 MG/DOSE, 8 MG/3ML SOPN Inject 2 mg as directed once a week. 9 mL 1   SURE COMFORT PEN NEEDLES 32G X 6 MM MISC USE AS DIRECTED 100 each 11   tacrolimus  (PROGRAF ) 1 MG capsule Take 1 capsule by mouth every morning.     TYLENOL  PM EXTRA STRENGTH 1000-50 MG/30ML LIQD Take by mouth.     UBRELVY  50 MG TABS Take 1 tablet by mouth daily as needed.     venlafaxine  XR (EFFEXOR -XR) 37.5 MG 24 hr capsule Take 37.5 mg by mouth every morning.     venlafaxine  XR (EFFEXOR -XR) 75 MG 24 hr capsule Take 1 capsule (75 mg total) by mouth daily with breakfast. Take along with 37.5 mg daily 30 capsule 10   No current facility-administered medications on file prior to visit.    There are no Patient Instructions on file for this visit. No follow-ups on file.   Chalet Kerwin E Osie Merkin, NP

## 2024-05-18 ENCOUNTER — Telehealth: Payer: Self-pay

## 2024-05-18 DIAGNOSIS — F4310 Post-traumatic stress disorder, unspecified: Secondary | ICD-10-CM

## 2024-05-18 MED ORDER — VENLAFAXINE HCL ER 37.5 MG PO CP24: 37.5000 mg | ORAL_CAPSULE | Freq: Every morning | ORAL | 5 refills | Status: DC

## 2024-05-18 NOTE — Telephone Encounter (Signed)
 received phone message that pt needs refill on the venlafaxine  xr 37.5mg . pt was last seen on 7-23 next appt 10-21

## 2024-05-18 NOTE — Telephone Encounter (Signed)
 I have sent venlafaxine  extended release 37.5 mg to pharmacy as requested.

## 2024-05-19 ENCOUNTER — Ambulatory Visit: Admitting: Physical Therapy

## 2024-05-19 ENCOUNTER — Encounter

## 2024-05-19 NOTE — Telephone Encounter (Signed)
 Pt.notified

## 2024-05-19 NOTE — Telephone Encounter (Signed)
 Pt was notified.

## 2024-05-20 ENCOUNTER — Telehealth: Payer: Self-pay

## 2024-05-24 ENCOUNTER — Encounter

## 2024-05-24 ENCOUNTER — Ambulatory Visit: Admitting: Physical Therapy

## 2024-05-24 DIAGNOSIS — M5414 Radiculopathy, thoracic region: Secondary | ICD-10-CM | POA: Diagnosis not present

## 2024-05-24 DIAGNOSIS — F3189 Other bipolar disorder: Secondary | ICD-10-CM | POA: Diagnosis not present

## 2024-05-24 DIAGNOSIS — M79606 Pain in leg, unspecified: Secondary | ICD-10-CM | POA: Diagnosis not present

## 2024-05-24 DIAGNOSIS — M79669 Pain in unspecified lower leg: Secondary | ICD-10-CM | POA: Diagnosis not present

## 2024-05-24 DIAGNOSIS — Z79891 Long term (current) use of opiate analgesic: Secondary | ICD-10-CM | POA: Diagnosis not present

## 2024-05-24 DIAGNOSIS — I10 Essential (primary) hypertension: Secondary | ICD-10-CM | POA: Diagnosis not present

## 2024-05-24 DIAGNOSIS — M79603 Pain in arm, unspecified: Secondary | ICD-10-CM | POA: Diagnosis not present

## 2024-05-24 DIAGNOSIS — M5442 Lumbago with sciatica, left side: Secondary | ICD-10-CM | POA: Diagnosis not present

## 2024-05-24 DIAGNOSIS — M25519 Pain in unspecified shoulder: Secondary | ICD-10-CM | POA: Diagnosis not present

## 2024-05-24 DIAGNOSIS — G893 Neoplasm related pain (acute) (chronic): Secondary | ICD-10-CM | POA: Diagnosis not present

## 2024-05-24 DIAGNOSIS — G894 Chronic pain syndrome: Secondary | ICD-10-CM | POA: Diagnosis not present

## 2024-05-26 ENCOUNTER — Encounter

## 2024-05-26 ENCOUNTER — Encounter: Payer: Self-pay | Admitting: "Endocrinology

## 2024-05-26 ENCOUNTER — Ambulatory Visit: Admitting: "Endocrinology

## 2024-05-26 ENCOUNTER — Ambulatory Visit: Admitting: Physical Therapy

## 2024-05-26 VITALS — BP 122/80 | HR 85 | Ht 66.0 in | Wt 249.0 lb

## 2024-05-26 DIAGNOSIS — Z794 Long term (current) use of insulin: Secondary | ICD-10-CM | POA: Diagnosis not present

## 2024-05-26 DIAGNOSIS — Z7985 Long-term (current) use of injectable non-insulin antidiabetic drugs: Secondary | ICD-10-CM | POA: Diagnosis not present

## 2024-05-26 DIAGNOSIS — E782 Mixed hyperlipidemia: Secondary | ICD-10-CM

## 2024-05-26 DIAGNOSIS — Z7984 Long term (current) use of oral hypoglycemic drugs: Secondary | ICD-10-CM | POA: Diagnosis not present

## 2024-05-26 DIAGNOSIS — E1165 Type 2 diabetes mellitus with hyperglycemia: Secondary | ICD-10-CM | POA: Diagnosis not present

## 2024-05-26 NOTE — Patient Instructions (Signed)
 Will recommend the following:  Basaglar  38 units at bedtime  Novolog  9 units three times a day 15 min before meals + correction scale  Novolog  correction scale: Use in addition to your meal time insulin  based on blood sugars as follows: 151 - 175: 1 unit 176 - 200: 2 units 201 - 225: 3 units 226 - 250: 4 units 251 - 275: 5 units 276 - 300: 6 units 301 - 325: 7 units 326 - 350: 8 units 351 - 375: 9 units 376 - 400: 10 units  Continue Metformin  1000 mg bid Continue Glipizide  XL 10 mg BID Ozempic  1 mg weekly ->2 mg after 4 weeks

## 2024-05-26 NOTE — Progress Notes (Signed)
 Outpatient Endocrinology Note Alison Birmingham, [name]  05/26/24   Alison [name] March 20, 1951 [id number]  Referring Provider: Gareth Mliss FALCON, FNP Primary Care Provider: Gareth Mliss FALCON, FNP Reason for consultation: Subjective   Assessment & Plan  Diagnoses and all orders for this visit:  Uncontrolled type 2 diabetes mellitus with hyperglycemia (HCC)  Long-term insulin  use (HCC)  Long term (current) use of oral hypoglycemic drugs  Long-term (current) use of injectable non-insulin  antidiabetic drugs  Mixed hypercholesterolemia and hypertriglyceridemia   Diabetes Type II complicated by neuropathy, No results found for: GFR Hba1c goal less than 7, current Hba1c is  Lab Results  Component Value Date   HGBA1C 9.7 (H) 04/01/2024   Will recommend the following: 11/2023 C-peptide + Reinforced compliance  Basaglar  38 units at bedtime  Novolog  9 units three times a day 15 min before meals + correction scale  Novolog  correction scale: Use in addition to your meal time insulin  based on blood sugars as follows: 151 - 175: 1 unit 176 - 200: 2 units 201 - 225: 3 units 226 - 250: 4 units 251 - 275: 5 units 276 - 300: 6 units 301 - 325: 7 units 326 - 350: 8 units 351 - 375: 9 units 376 - 400: 10 units  Continue Metformin  1000 mg bid Continue Glipizide  XL 10 mg BID Ozempic  1 mg weekly ->2 mg after 4 weeks  Ordered DM education Ordered DexCom, not covered [employer] 3 +, not covered  Discussed that its unsafe not to check BG before meals, patient understands and greed  Ran out of Trulicity  previously, started on ozempic  and ran out 2 weeks ago   No known contraindications/side effects to any of above medications Glucagon discussed and prescribed with refills on 11/2023  -Last LD and Tg are as follows: Lab Results  Component Value Date   LDLCALC 57 04/01/2024    Lab Results  Component Value Date   TRIG 329 (H) 04/01/2024   -On rosuvastatin  10 mg QD -Follow low fat  diet and exercise   -Blood pressure goal <140/90 - Microalbumin/creatinine goal is < 30 -Last MA/Cr is as follows: Lab Results  Component Value Date   MICROALBUR 41.5 11/13/2023   -on ACE/ARB lisinopril  20 mg every day  -diet changes including salt restriction -limit eating outside -counseled BP targets per standards of diabetes care -uncontrolled blood pressure can lead to retinopathy, nephropathy and cardiovascular and atherosclerotic heart disease  Reviewed and counseled on: -A1C target -Blood sugar targets -Complications of uncontrolled diabetes  -Checking blood sugar before meals and bedtime and bring log next visit -All medications with mechanism of action and side effects -Hypoglycemia management: rule of 15's, Glucagon Emergency Kit and medical alert ID -low-carb low-fat plate-method diet -At least 20 minutes of physical activity per day -Annual dilated retinal eye exam and foot exam -compliance and follow up needs -follow up as scheduled or earlier if problem gets worse  Call if blood sugar is less than 70 or consistently above 250    Take a 15 gm snack of carbohydrate at bedtime before you go to sleep if your blood sugar is less than 100.    If you are going to fast after midnight for a test or procedure, ask your physician for instructions on how to reduce/decrease your insulin  dose.    Call if blood sugar is less than 70 or consistently above 250  -Treating a low sugar by rule of 15  (15 gms of sugar every 15  min until sugar is more than 70) If you feel your sugar is low, test your sugar to be sure If your sugar is low (less than 70), then take 15 grams of a fast acting Carbohydrate (3-4 glucose tablets or glucose gel or 4 ounces of juice or regular soda) Recheck your sugar 15 min after treating low to make sure it is more than 70 If sugar is still less than 70, treat again with 15 grams of carbohydrate          Don't drive the hour of hypoglycemia  If  unconscious/unable to eat or drink by mouth, use glucagon injection or nasal spray baqsimi and call 911. Can repeat again in 15 min if still unconscious.  Return in about 6 weeks (around 07/07/2024).   I have reviewed current medications, nurse's notes, allergies, vital signs, past medical and surgical history, family medical history, and social history for this encounter. Counseled patient on symptoms, examination findings, lab findings, imaging results, treatment decisions and monitoring and prognosis. The patient understood the recommendations and agrees with the treatment plan. All questions regarding treatment plan were fully answered.  Alison Birmingham, [name]  05/26/24   History of Present Illness Alison [name] is a 48 y.o. year old female who presents for follow up of Type II diabetes mellitus.  Alison [name] was first diagnosed around 2020.   Diabetes education +  Home diabetes regimen: Lantus  40 units at bedtime Novolog  Sliding scale 2:50>150 right before break fast only  Metformin  1000 mg bid Glipizide  XL 10 gm bid  Ozempic  1 mg weekly: well tolerated without issues   Ran out of Trulicity  for 2 mo   COMPLICATIONS -  MI/Stroke -  retinopathy +  neuropathy -  nephropathy  BLOOD SUGAR DATA 65-407 BG  Physical Exam  BP 122/80   Pulse 85   Ht 5' 6 (1.676 m)   Wt 249 lb (112.9 kg)   LMP 10/16/2021 (Exact Date) Comment: Seen by OB GYN on 10/16/21 for 6 days of excessive bleeding as a post menapause  SpO2 98%   BMI 40.19 kg/m    Constitutional: well developed, well nourished Head: normocephalic, atraumatic Eyes: sclera anicteric, no redness Neck: supple Lungs: normal respiratory effort Neurology: alert and oriented Skin: dry, no appreciable rashes Musculoskeletal: no appreciable defects Psychiatric: normal mood and affect Diabetic Foot Exam - Simple   No data filed      Current Medications Patient's Medications  New Prescriptions   No medications on  file  Previous Medications   ACCU-CHEK SOFTCLIX LANCETS LANCETS    2 (two) times daily.   ALBUTEROL  (VENTOLIN  HFA) 108 (90 BASE) MCG/ACT INHALER    Inhale 2 puffs into the lungs every 6 (six) hours as needed for wheezing or shortness of breath.   BACLOFEN  (LIORESAL ) 10 MG TABLET    Take 1 tablet by mouth 2 (two) times daily.   BENZTROPINE  (COGENTIN ) 1 MG TABLET    TAKE 1 TABLET BY MOUTH ONCE DAILY AS NEEDED FOR TREMORS   CONTINUOUS GLUCOSE SENSOR (FREESTYLE LIBRE 3 PLUS SENSOR) MISC    CHANGE SENSOR EVERY 15 DAYS   CRANBERRY-VITAMIN C-PROBIOTIC (AZO CRANBERRY PO)    Take by mouth.   FLUTICASONE  (FLONASE ) 50 MCG/ACT NASAL SPRAY    Place into the nose.   GLIPIZIDE  (GLUCOTROL  XL) 10 MG 24 HR TABLET    TAKE 1 TABLET BY MOUTH IN THE MORNING AND AT BEDTIME   GLUCOSE BLOOD (RELION GLUCOSE TEST STRIPS) TEST STRIP  Use as instructed   INSULIN  GLARGINE (LANTUS  SOLOSTAR) 100 UNIT/ML SOLOSTAR PEN    Inject 40 Units into the skin 2 (two) times daily.   LISINOPRIL  (ZESTRIL ) 20 MG TABLET    TAKE 1 TABLET BY MOUTH ONCE DAILY *PATIENT WILL NEED TO SCHEDULE AN APPOINTMENT FOR ADDITIONAL REFILLS*   LURASIDONE  (LATUDA ) 20 MG TABS TABLET    TAKE 1 TABLET BY MOUTH ONCE DAILY WITH SUPPER *REFILL REQUEST*   MAGNESIUM  OXIDE (MAG-OX) 400 MG TABLET    Take 1,000 mg by mouth 2 (two) times daily.   MELATONIN 10 MG TABS    Take 10 mg by mouth at bedtime.   METFORMIN  (GLUCOPHAGE ) 1000 MG TABLET    TAKE 1 TABLET BY MOUTH TWICE DAILY WITH MEALS *PATIENT WILL NEED TO SCHEDULE APPOINTMENT FOR ADDITIONAL REFILLS*   MIRABEGRON  ER (MYRBETRIQ ) 50 MG TB24 TABLET    Take 1 tablet (50 mg total) by mouth daily.   MIRTAZAPINE  (REMERON ) 15 MG TABLET    TAKE 1 TABLET BY MOUTH AT BEDTIME   NALOXONE  (NARCAN ) NASAL SPRAY 4 MG/0.1 ML    Place 0.4 mg into the nose once.   NOVOLOG  FLEXPEN 100 UNIT/ML FLEXPEN    Inject 40 Units into the skin 3 (three) times daily with meals. Hold if not eating at least half of a full meal.   ONDANSETRON  (ZOFRAN )  4 MG TABLET    Take 1 tablet (4 mg total) by mouth every 6 (six) hours as needed for nausea.   OXYCODONE  (OXY IR/ROXICODONE ) 5 MG IMMEDIATE RELEASE TABLET    Take 5 mg by mouth 3 (three) times daily as needed.   PANTOPRAZOLE  (PROTONIX ) 40 MG TABLET    Take 1 tablet (40 mg total) by mouth daily. Recommend using while on Prednisone .   PRECISION QID TEST TEST STRIP       PREDNISONE  (DELTASONE ) 50 MG TABLET    Take one tablet (50 mg) by mouth once daily until follow up with Neurology.   PREGABALIN  (LYRICA ) 200 MG CAPSULE    Take 1 capsule (200 mg total) by mouth 2 (two) times daily.   ROSUVASTATIN  (CRESTOR ) 10 MG TABLET    Take 1 tablet (10 mg total) by mouth daily.   SEMAGLUTIDE , 2 MG/DOSE, 8 MG/3ML SOPN    Inject 2 mg as directed once a week.   SURE COMFORT PEN NEEDLES 32G X 6 MM MISC    USE AS DIRECTED   TACROLIMUS  (PROGRAF ) 1 MG CAPSULE    Take 1 capsule by mouth every morning.   TYLENOL  PM EXTRA STRENGTH 1000-50 MG/30ML LIQD    Take by mouth.   UBRELVY  50 MG TABS    Take 1 tablet by mouth daily as needed.   VENLAFAXINE  XR (EFFEXOR -XR) 37.5 MG 24 HR CAPSULE    Take 1 capsule (37.5 mg total) by mouth every morning. Take along with 75 mg daily   VENLAFAXINE  XR (EFFEXOR -XR) 75 MG 24 HR CAPSULE    Take 1 capsule (75 mg total) by mouth daily with breakfast. Take along with 37.5 mg daily  Modified Medications   No medications on file  Discontinued Medications   No medications on file    Allergies Allergies  Allergen Reactions   Penicillin G Itching, Rash, Anaphylaxis and Palpitations   Lamictal  [Lamotrigine ] Rash   Tape    Carbamazepine Other (See Comments)    Medication interaction-prograf     Hydrocodone-Acetaminophen  Itching   Naproxen Itching   Penicillins    Vancomycin  Rash    Macular/blotchy  rash at infusion site    Past Medical History Past Medical History:  Diagnosis Date   Abnormal uterine bleeding    Allergy    Anxiety    Arthritis    Bipolar disorder (manic depression)  (HCC)    Cancer (HCC) Diffuse B-Cell Lymphoma   2015   Chest pain    a. 09/2021 MV: Low risk without ischemia or infarct.  Normal EF.   Chronic kidney failure    Chronic renal disease, stage III (HCC)    Diabetes mellitus without complication (HCC)    DLBCL (diffuse large B cell lymphoma) (HCC) 2015   Right axillary lymph node resected and chemo tx's.   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 02/2023 Echo: EF 55-60%, no rwma, nl RV size/fxn. Mildly dil LA.   Personality disorder (HCC)    Pineal gland cyst    Post herpetic neuralgia    PTSD (post-traumatic stress disorder)    RBBB    Renal disorder    S/P liver transplant (HCC) 1993   Sleep apnea    has not recieved cpap yet-other one was recalled    Past Surgical History Past Surgical History:  Procedure Laterality Date   ARTERY BIOPSY Right 04/14/2024   Procedure: BIOPSY TEMPORAL ARTERY;  Surgeon: Marea Selinda RAMAN, [name];  Location: [facility];  Service: General;  Laterality: Right;   BONE MARROW BIOPSY  01/13/2015   BREAST BIOPSY Right 12/2014   US  Axilla Bx, Positive for Lymphoma   BREAST BIOPSY Right 2011   benign   BREAST BIOPSY Right 2023   Stereo bx, X-clip, Fat Necrosis   BREAST BIOPSY Right 01/20/2024   US  RT BREAST BX W LOC DEV 1ST LESION IMG BX SPEC US  GUIDE 01/20/2024 [facility]   BREAST SURGERY Right 2016   Lymphoma, lymph node removal. pt then got a seroma and had to do more surgery   CHOLECYSTECTOMY     COLONOSCOPY     COLONOSCOPY WITH PROPOFOL  N/A 02/28/2022   Procedure: COLONOSCOPY WITH PROPOFOL ;   Surgeon: Jinny Carmine, [name];  Location: [facility];  Service: Endoscopy;  Laterality: N/A;   ESOPHAGOGASTRODUODENOSCOPY     ESOPHAGOGASTRODUODENOSCOPY (EGD) WITH PROPOFOL  N/A 01/09/2021   Procedure: ESOPHAGOGASTRODUODENOSCOPY (EGD) WITH PROPOFOL ;  Surgeon: Jinny Carmine, [name];  Location: [facility];  Service: Endoscopy;  Laterality: N/A;   HERNIA REPAIR     x4-all from liver transplant   HYSTEROSCOPY WITH D & C N/A 11/13/2021   Procedure: DILATATION AND CURETTAGE /HYSTEROSCOPY;  Surgeon: Victor Claudell SAUNDERS, [name];  Location: [facility];  Service: Gynecology;  Laterality: N/A;   infusaport     LIVER TRANSPLANT  12/17/1991   LUMBAR PUNCTURE     PORT A CATH INJECTION ([facility])     RENAL BIOPSY     tumor removal  2015   cancer right side    Family History family history includes Alcohol abuse in her brother and maternal uncle;  Anxiety disorder in her mother; Arthritis in her mother; Bipolar disorder in her mother; COPD in her maternal grandmother; Cancer in her maternal aunt, maternal grandfather, and maternal uncle; Cancer - Other in her mother; Depression in her brother and mother; Diabetes in her father; Heart disease in her father and mother; Hypertension in her brother, father, and mother; Lupus in her paternal grandmother; Parkinson's disease in her maternal grandmother.  Social History Social History   Socioeconomic History   Marital status: Single    Spouse name: Not on file   Number of children: 0   Years of education: 9   Highest education level: GED or equivalent  Occupational History   Occupation: disabled  Tobacco Use   Smoking status: Never    Passive exposure: Past   Smokeless tobacco: Never   Tobacco comments:    Occasional CBD oil  Vaping Use   Vaping status: Never Used  Substance and Sexual Activity   Alcohol use: Not on file   Drug use: Not Currently    Comment: pain managment last used in early april   Sexual activity: Not Currently  Other Topics Concern    Not on file  Social History Narrative   Not on file   Social Drivers of Health   Financial Resource Strain: Low Risk  (04/01/2024)   Overall Financial Resource Strain (CARDIA)    Difficulty of Paying Living Expenses: Not very hard  Food Insecurity: No Food Insecurity (04/22/2024)   Hunger Vital Sign    Worried About Running Out of Food in the Last Year: Never true    Ran Out of Food in the Last Year: Never true  Transportation Needs: No Transportation Needs (04/22/2024)   PRAPARE - Administrator, Civil Service (Medical): No    Lack of Transportation (Non-Medical): No  Physical Activity: Inactive (04/01/2024)   Exercise Vital Sign    Days of Exercise per Week: 0 days    Minutes of Exercise per Session: 0 min  Stress: Stress Concern Present (04/01/2024)   [employer] of Occupational Health - Occupational Stress Questionnaire    Feeling of Stress: To some extent  Social Connections: Unknown (04/12/2024)   Social Connection and Isolation Panel    Frequency of Communication with Friends and Family: Patient declined    Frequency of Social Gatherings with Friends and Family: Patient declined    Attends Religious Services: Patient declined    Active Member of Clubs or Organizations: Patient declined    Attends Banker Meetings: Patient declined    Marital Status: Living with partner  Intimate Partner Violence: Not At Risk (04/22/2024)   Humiliation, Afraid, Rape, and Kick questionnaire    Fear of Current or Ex-Partner: No    Emotionally Abused: No    Physically Abused: No    Sexually Abused: No    Lab Results  Component Value Date   HGBA1C 9.7 (H) 04/01/2024   HGBA1C 11.8 (H) 11/13/2023   HGBA1C 10.8 (H) 10/16/2023   Lab Results  Component Value Date   CHOL 122 04/01/2024   Lab Results  Component Value Date   HDL 30 (L) 04/01/2024   Lab Results  Component Value Date   LDLCALC 57 04/01/2024   Lab Results  Component Value Date   TRIG 329 (H)  04/01/2024   Lab Results  Component Value Date   CHOLHDL 4.1 04/01/2024   Lab Results  Component Value Date   CREATININE 1.12 (H) 04/13/2024   No results found for:  GFR Lab Results  Component Value Date   MICROALBUR 41.5 11/13/2023      Component Value Date/Time   NA 132 (L) 04/13/2024 0406   K 4.5 04/13/2024 0406   CL 95 (L) 04/13/2024 0406   CO2 23 04/13/2024 0406   GLUCOSE 436 (H) 04/14/2024 2237   BUN 27 (H) 04/13/2024 0406   CREATININE 1.12 (H) 04/13/2024 0406   CREATININE 1.14 (H) 04/01/2024 1043   CALCIUM  9.3 04/13/2024 0406   CALCIUM  7.6 (L) 06/27/2022 1448   PROT 7.1 04/12/2024 0500   ALBUMIN 3.5 04/12/2024 0500   AST 19 04/12/2024 0500   AST 22 03/03/2024 0958   ALT 15 04/12/2024 0500   ALT 19 03/03/2024 0958   ALKPHOS 67 04/12/2024 0500   BILITOT 1.0 04/12/2024 0500   BILITOT 0.5 03/03/2024 0958   GFRNONAA 60 (L) 04/13/2024 0406   GFRNONAA >60 03/03/2024 0958   GFRNONAA 64 12/11/2020 0000   GFRAA 74 12/11/2020 0000      Latest Ref Rng & Units 04/14/2024   10:37 PM 04/13/2024    4:06 AM 04/12/2024    5:00 AM  BMP  Glucose 70 - 99 mg/dL 563  662  757   BUN 6 - 20 mg/dL  27  14   Creatinine 9.55 - 1.00 mg/dL  8.87  9.11   Sodium 864 - 145 mmol/L  132  136   Potassium 3.5 - 5.1 mmol/L  4.5  4.5   Chloride 98 - 111 mmol/L  95  102   CO2 22 - 32 mmol/L  23  24   Calcium  8.9 - 10.3 mg/dL  9.3  9.3        Component Value Date/Time   WBC 9.3 04/13/2024 0406   RBC 4.15 04/13/2024 0406   HGB 11.7 (L) 04/13/2024 0406   HGB 11.9 (L) 03/03/2024 0945   HGB 14.6 10/16/2021 1122   HCT 35.1 (L) 04/13/2024 0406   HCT 44.0 10/16/2021 1122   PLT 185 04/13/2024 0406   PLT 223 03/03/2024 0945   MCV 84.6 04/13/2024 0406   MCV 88 10/16/2021 1122   MCH 28.2 04/13/2024 0406   MCHC 33.3 04/13/2024 0406   RDW 14.5 04/13/2024 0406   RDW 13.7 10/16/2021 1122   LYMPHSABS 1.8 03/14/2024 2145   LYMPHSABS 2.0 10/16/2021 1122   MONOABS 0.7 03/14/2024 2145   EOSABS 72  04/01/2024 1043   EOSABS 0.4 10/16/2021 1122   BASOSABS 54 04/01/2024 1043   BASOSABS 0.1 10/16/2021 1122     Parts of this note may have been dictated using voice recognition software. There may be variances in spelling and vocabulary which are unintentional. Not all errors are proofread. Please notify the dino if any discrepancies are noted or if the meaning of any statement is not clear.

## 2024-05-27 ENCOUNTER — Other Ambulatory Visit: Payer: Self-pay | Admitting: Cardiology

## 2024-05-27 ENCOUNTER — Other Ambulatory Visit: Payer: Self-pay | Admitting: Nurse Practitioner

## 2024-05-27 DIAGNOSIS — E1165 Type 2 diabetes mellitus with hyperglycemia: Secondary | ICD-10-CM

## 2024-05-27 DIAGNOSIS — F4310 Post-traumatic stress disorder, unspecified: Secondary | ICD-10-CM | POA: Diagnosis not present

## 2024-05-27 DIAGNOSIS — N184 Chronic kidney disease, stage 4 (severe): Secondary | ICD-10-CM

## 2024-05-27 DIAGNOSIS — F331 Major depressive disorder, recurrent, moderate: Secondary | ICD-10-CM | POA: Diagnosis not present

## 2024-05-27 DIAGNOSIS — I10 Essential (primary) hypertension: Secondary | ICD-10-CM

## 2024-05-27 DIAGNOSIS — F411 Generalized anxiety disorder: Secondary | ICD-10-CM | POA: Diagnosis not present

## 2024-05-28 NOTE — Telephone Encounter (Signed)
 Requested medications are due for refill today.  yes  Requested medications are on the active medications list.  yes  Last refill. 02/27/2024 90 day supply  Future visit scheduled.   yes  Notes to clinic.  Medications were paused. Please review to restart.    Requested Prescriptions  Pending Prescriptions Disp Refills   lisinopril  (ZESTRIL ) 20 MG tablet [Pharmacy Med Name: LISINOPRIL  20 MG TABLET 20 Tablet] 90 tablet 11    Sig: TAKE 1 TABLET BY MOUTH ONCE DAILY *PATIENT WILL NEED TO SCHEDULE AN APPOINTMENT FOR ADDITIONAL REFILLS*     Cardiovascular:  ACE Inhibitors Failed - 05/28/2024  3:00 PM      Failed - Cr in normal range and within 180 days    Creat  Date Value Ref Range Status  04/01/2024 1.14 (H) 0.50 - 1.03 mg/dL Final   Creatinine, Ser  Date Value Ref Range Status  04/13/2024 1.12 (H) 0.44 - 1.00 mg/dL Final   Creatinine, Urine  Date Value Ref Range Status  11/13/2023 75 20 - 275 mg/dL Final         Passed - K in normal range and within 180 days    Potassium  Date Value Ref Range Status  04/13/2024 4.5 3.5 - 5.1 mmol/L Final         Passed - Patient is not pregnant      Passed - Last BP in normal range    BP Readings from Last 1 Encounters:  05/26/24 122/80         Passed - Valid encounter within last 6 months    Recent Outpatient Visits           2 weeks 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   [facility] Gareth Mliss FALCON, FNP   1 month ago Essential hypertension   [facility] Gareth Mliss FALCON, FNP   3 months ago Essential hypertension   [facility] Gareth Mliss FALCON, FNP   4 months ago Mass of upper inner quadrant of right breast   [facility] Gareth Mliss FALCON, FNP       Future Appointments             In 5 months Gareth, Mliss FALCON, FNP [facility]             metFORMIN  (GLUCOPHAGE ) 1000 MG tablet [Pharmacy Med Name:  METFORMIN  1000MG  TABLET 1000 Tablet] 180 tablet 11    Sig: TAKE ONE (1) TABLET BY MOUTH TWICE DAILY WITH MEALS *PATIENT NEEDS APPOINTMENT FOR ADDITIONAL REFILLS*     Endocrinology:  Diabetes - Biguanides Failed - 05/28/2024  3:00 PM      Failed - Cr in normal range and within 360 days    Creat  Date Value Ref Range Status  04/01/2024 1.14 (H) 0.50 - 1.03 mg/dL Final   Creatinine, Ser  Date Value Ref Range Status  04/13/2024 1.12 (H) 0.44 - 1.00 mg/dL Final   Creatinine, Urine  Date Value Ref Range Status  11/13/2023 75 20 - 275 mg/dL Final         Failed - HBA1C is between 0 and 7.9 and within 180 days    Hemoglobin A1C  Date Value Ref Range Status  03/06/2022 6.4  Final   Hgb A1c MFr Bld  Date Value Ref Range Status  04/01/2024 9.7 (H) <5.7 % Final    Comment:    For someone without known diabetes, a hemoglobin  A1c value of 6.5% or greater indicates that they may have  diabetes and this should be confirmed with a follow-up  test. . For someone with known diabetes, a value <7% indicates  that their diabetes is well controlled and a value  greater than or equal to 7% indicates suboptimal  control. A1c targets should be individualized based on  duration of diabetes, age, comorbid conditions, and  other considerations. . Currently, no consensus exists regarding use of hemoglobin A1c for diagnosis of diabetes for children. .          Failed - eGFR in normal range and within 360 days    GFR, Est African American  Date Value Ref Range Status  12/11/2020 74 > OR = 60 mL/min/1.29m2 Final   GFR, Est Non African American  Date Value Ref Range Status  12/11/2020 64 > OR = 60 mL/min/1.78m2 Final   GFR, Estimated  Date Value Ref Range Status  04/13/2024 60 (L) >60 mL/min Final    Comment:    (NOTE) Calculated using the CKD-EPI Creatinine Equation (2021)   03/03/2024 >60 >60 mL/min Final    Comment:    (NOTE) Calculated using the CKD-EPI Creatinine Equation (2021)     eGFR  Date Value Ref Range Status  04/01/2024 59 (L) > OR = 60 mL/min/1.80m2 Final         Failed - B12 Level in normal range and within 720 days    No results found for: VITAMINB12       Passed - Valid encounter within last 6 months    Recent Outpatient Visits           2 weeks 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   [facility] Gareth Mliss FALCON, FNP   1 month ago Essential hypertension   [facility] Gareth Mliss FALCON, FNP   3 months ago Essential hypertension   [facility] Gareth Mliss FALCON, FNP   4 months ago Mass of upper inner quadrant of right breast   [facility] Gareth Mliss FALCON, FNP       Future Appointments             In 5 months Gareth, Mliss FALCON, FNP [facility]            Passed - CBC within normal limits and completed in the last 12 months    WBC  Date Value Ref Range Status  04/13/2024 9.3 4.0 - 10.5 K/uL Final   RBC  Date Value Ref Range Status  04/13/2024 4.15 3.87 - 5.11 MIL/uL Final   Hemoglobin  Date Value Ref Range Status  04/13/2024 11.7 (L) 12.0 - 15.0 g/dL Final  94/71/7974 88.0 (L) 12.0 - 15.0 g/dL Final  98/89/7976 85.3 11.1 - 15.9 g/dL Final   HCT  Date Value Ref Range Status  04/13/2024 35.1 (L) 36.0 - 46.0 % Final   Hematocrit  Date Value Ref Range Status  10/16/2021 44.0 34.0 - 46.6 % Final   MCHC  Date Value Ref Range Status  04/13/2024 33.3 30.0 - 36.0 g/dL Final   [facility]  Date Value Ref Range Status  04/13/2024 28.2 26.0 - 34.0 pg Final   MCV  Date Value Ref Range Status  04/13/2024 84.6 80.0 - 100.0 fL Final  10/16/2021 88 79 - 97 fL Final   No results found for: PLTCOUNTKUC, LABPLAT, POCPLA RDW  Date Value Ref Range Status  04/13/2024 14.5  11.5 - 15.5 % Final  10/16/2021 13.7 11.7 - 15.4 % Final

## 2024-05-31 ENCOUNTER — Encounter

## 2024-05-31 ENCOUNTER — Ambulatory Visit: Admitting: Physical Therapy

## 2024-06-02 ENCOUNTER — Ambulatory Visit: Admitting: Physical Therapy

## 2024-06-02 ENCOUNTER — Encounter

## 2024-06-02 DIAGNOSIS — Z1159 Encounter for screening for other viral diseases: Secondary | ICD-10-CM | POA: Diagnosis not present

## 2024-06-02 DIAGNOSIS — R531 Weakness: Secondary | ICD-10-CM | POA: Diagnosis not present

## 2024-06-02 DIAGNOSIS — H469 Unspecified optic neuritis: Secondary | ICD-10-CM | POA: Diagnosis not present

## 2024-06-02 DIAGNOSIS — Z79899 Other long term (current) drug therapy: Secondary | ICD-10-CM | POA: Diagnosis not present

## 2024-06-02 DIAGNOSIS — R748 Abnormal levels of other serum enzymes: Secondary | ICD-10-CM | POA: Diagnosis not present

## 2024-06-02 DIAGNOSIS — C884 Extranodal marginal zone b-cell lymphoma of mucosa-associated lymphoid tissue (malt-lymphoma) not having achieved remission: Secondary | ICD-10-CM | POA: Diagnosis not present

## 2024-06-02 DIAGNOSIS — Z1331 Encounter for screening for depression: Secondary | ICD-10-CM | POA: Diagnosis not present

## 2024-06-02 DIAGNOSIS — M3219 Other organ or system involvement in systemic lupus erythematosus: Secondary | ICD-10-CM | POA: Diagnosis not present

## 2024-06-03 ENCOUNTER — Inpatient Hospital Stay: Payer: Medicaid Other

## 2024-06-03 ENCOUNTER — Inpatient Hospital Stay: Attending: Oncology

## 2024-06-04 ENCOUNTER — Observation Stay
Admission: EM | Admit: 2024-06-04 | Discharge: 2024-06-05 | Disposition: A | Discharge status: Home / Self Care | Source: Ambulatory Visit | Attending: Hospitalist | Admitting: Hospitalist

## 2024-06-04 ENCOUNTER — Emergency Department

## 2024-06-04 ENCOUNTER — Other Ambulatory Visit: Payer: Self-pay

## 2024-06-04 ENCOUNTER — Other Ambulatory Visit: Payer: Self-pay | Admitting: Psychiatry

## 2024-06-04 DIAGNOSIS — F419 Anxiety disorder, unspecified: Secondary | ICD-10-CM | POA: Insufficient documentation

## 2024-06-04 DIAGNOSIS — Z944 Liver transplant status: Secondary | ICD-10-CM | POA: Diagnosis not present

## 2024-06-04 DIAGNOSIS — E1122 Type 2 diabetes mellitus with diabetic chronic kidney disease: Secondary | ICD-10-CM | POA: Insufficient documentation

## 2024-06-04 DIAGNOSIS — F418 Other specified anxiety disorders: Secondary | ICD-10-CM | POA: Diagnosis present

## 2024-06-04 DIAGNOSIS — F32A Depression, unspecified: Secondary | ICD-10-CM | POA: Insufficient documentation

## 2024-06-04 DIAGNOSIS — M329 Systemic lupus erythematosus, unspecified: Secondary | ICD-10-CM | POA: Diagnosis not present

## 2024-06-04 DIAGNOSIS — G8929 Other chronic pain: Secondary | ICD-10-CM | POA: Diagnosis not present

## 2024-06-04 DIAGNOSIS — M7752 Other enthesopathy of left foot: Secondary | ICD-10-CM | POA: Diagnosis not present

## 2024-06-04 DIAGNOSIS — Z6839 Body mass index (BMI) 39.0-39.9, adult: Secondary | ICD-10-CM | POA: Insufficient documentation

## 2024-06-04 DIAGNOSIS — C884 Extranodal marginal zone b-cell lymphoma of mucosa-associated lymphoid tissue (malt-lymphoma) not having achieved remission: Secondary | ICD-10-CM | POA: Diagnosis not present

## 2024-06-04 DIAGNOSIS — N182 Chronic kidney disease, stage 2 (mild): Secondary | ICD-10-CM | POA: Diagnosis not present

## 2024-06-04 DIAGNOSIS — R6510 Systemic inflammatory response syndrome (SIRS) of non-infectious origin without acute organ dysfunction: Secondary | ICD-10-CM | POA: Diagnosis present

## 2024-06-04 DIAGNOSIS — E669 Obesity, unspecified: Secondary | ICD-10-CM | POA: Diagnosis present

## 2024-06-04 DIAGNOSIS — R509 Fever, unspecified: Principal | ICD-10-CM | POA: Diagnosis present

## 2024-06-04 DIAGNOSIS — I129 Hypertensive chronic kidney disease with stage 1 through stage 4 chronic kidney disease, or unspecified chronic kidney disease: Secondary | ICD-10-CM | POA: Diagnosis not present

## 2024-06-04 DIAGNOSIS — F319 Bipolar disorder, unspecified: Secondary | ICD-10-CM | POA: Diagnosis present

## 2024-06-04 DIAGNOSIS — E785 Hyperlipidemia, unspecified: Secondary | ICD-10-CM | POA: Diagnosis present

## 2024-06-04 DIAGNOSIS — C8580 Other specified types of non-Hodgkin lymphoma, unspecified site: Secondary | ICD-10-CM | POA: Diagnosis not present

## 2024-06-04 DIAGNOSIS — F4310 Post-traumatic stress disorder, unspecified: Secondary | ICD-10-CM

## 2024-06-04 DIAGNOSIS — I10 Essential (primary) hypertension: Secondary | ICD-10-CM | POA: Diagnosis not present

## 2024-06-04 DIAGNOSIS — M25572 Pain in left ankle and joints of left foot: Secondary | ICD-10-CM

## 2024-06-04 DIAGNOSIS — G4733 Obstructive sleep apnea (adult) (pediatric): Secondary | ICD-10-CM

## 2024-06-04 DIAGNOSIS — W19XXXA Unspecified fall, initial encounter: Secondary | ICD-10-CM | POA: Insufficient documentation

## 2024-06-04 DIAGNOSIS — F3176 Bipolar disorder, in full remission, most recent episode depressed: Secondary | ICD-10-CM

## 2024-06-04 DIAGNOSIS — E1129 Type 2 diabetes mellitus with other diabetic kidney complication: Secondary | ICD-10-CM | POA: Diagnosis present

## 2024-06-04 DIAGNOSIS — F411 Generalized anxiety disorder: Secondary | ICD-10-CM

## 2024-06-04 LAB — URINALYSIS, ROUTINE W REFLEX MICROSCOPIC
Bacteria, UA: NONE SEEN
Bilirubin Urine: NEGATIVE
Glucose, UA: >=500 mg/dL — AB
Hgb urine dipstick: NEGATIVE
Ketones, ur: NEGATIVE mg/dL
Nitrite: NEGATIVE
Protein, ur: >=300 mg/dL — AB
Specific Gravity, Urine: 1.011 (ref 1.005–1.030)
pH: 6 (ref 5.0–8.0)

## 2024-06-04 LAB — COMPREHENSIVE METABOLIC PANEL WITH GFR
ALT: 35 U/L (ref 0–44)
AST: 33 U/L (ref 15–41)
Albumin: 3.6 g/dL (ref 3.5–5.0)
Alkaline Phosphatase: 62 U/L (ref 38–126)
Anion gap: 13 (ref 5–15)
BUN: 23 mg/dL — ABNORMAL HIGH (ref 6–20)
CO2: 25 mmol/L (ref 22–32)
Calcium: 9.3 mg/dL (ref 8.9–10.3)
Chloride: 96 mmol/L — ABNORMAL LOW (ref 98–111)
Creatinine, Ser: 0.99 mg/dL (ref 0.44–1.00)
GFR, Estimated: >60 mL/min (ref >60)
Glucose, Bld: 301 mg/dL — ABNORMAL HIGH (ref 70–99)
Potassium: 4.9 mmol/L (ref 3.5–5.1)
Sodium: 134 mmol/L — ABNORMAL LOW (ref 135–145)
Total Bilirubin: 1 mg/dL (ref 0.0–1.2)
Total Protein: 6.9 g/dL (ref 6.5–8.1)

## 2024-06-04 LAB — APTT: aPTT: 31 s (ref 24–36)

## 2024-06-04 LAB — LIPID PANEL
Cholesterol: 123 mg/dL (ref 0–200)
HDL: 44 mg/dL (ref >40)
LDL Cholesterol: 49 mg/dL (ref 0–99)
Total CHOL/HDL Ratio: 2.8 ratio
Triglycerides: 150 mg/dL — ABNORMAL HIGH (ref <150)
VLDL: 30 mg/dL (ref 0–40)

## 2024-06-04 LAB — CBC
HCT: 43.7 % (ref 36.0–46.0)
Hemoglobin: 14.6 g/dL (ref 12.0–15.0)
MCH: 28.6 pg (ref 26.0–34.0)
MCHC: 33.4 g/dL (ref 30.0–36.0)
MCV: 85.7 fL (ref 80.0–100.0)
Platelets: 180 K/uL (ref 150–400)
RBC: 5.1 MIL/uL (ref 3.87–5.11)
RDW: 14.5 % (ref 11.5–15.5)
WBC: 13.7 K/uL — ABNORMAL HIGH (ref 4.0–10.5)
nRBC: 0 % (ref 0.0–0.2)

## 2024-06-04 LAB — PROTIME-INR
INR: 1 (ref 0.8–1.2)
Prothrombin Time: 13.5 s (ref 11.4–15.2)

## 2024-06-04 LAB — RESP PANEL BY RT-PCR (RSV, FLU A&B, COVID)  RVPGX2
Influenza A by PCR: NEGATIVE
Influenza B by PCR: NEGATIVE
Resp Syncytial Virus by PCR: NEGATIVE
SARS Coronavirus 2 by RT PCR: NEGATIVE

## 2024-06-04 LAB — LACTIC ACID, PLASMA
Lactic Acid, Venous: 2.3 mmol/L (ref 0.5–1.9)
Lactic Acid, Venous: 3.1 mmol/L (ref 0.5–1.9)

## 2024-06-04 LAB — PROCALCITONIN: Procalcitonin: <0.1 ng/mL

## 2024-06-04 LAB — SEDIMENTATION RATE: Sed Rate: 33 mm/h — ABNORMAL HIGH (ref 0–30)

## 2024-06-04 LAB — TROPONIN I (HIGH SENSITIVITY): Troponin I (High Sensitivity): 11 ng/L (ref <18)

## 2024-06-04 LAB — GLUCOSE, CAPILLARY: Glucose-Capillary: 281 mg/dL — ABNORMAL HIGH (ref 70–99)

## 2024-06-04 MED ORDER — LURASIDONE HCL 20 MG PO TABS
20.0000 mg | ORAL_TABLET | Freq: Every day | ORAL | Status: DC
  Filled 2024-06-04: qty 1

## 2024-06-04 MED ORDER — OXYCODONE HCL 5 MG PO TABS
5.0000 mg | ORAL_TABLET | Freq: Four times a day (QID) | ORAL | Status: DC | PRN
  Administered 2024-06-05: 5 mg via ORAL
  Filled 2024-06-04: qty 1

## 2024-06-04 MED ORDER — INSULIN GLARGINE-YFGN 100 UNIT/ML ~~LOC~~ SOLN: 35.0000 [IU] | Freq: Every day | SUBCUTANEOUS | Status: DC

## 2024-06-04 MED ORDER — LINEZOLID 600 MG/300ML IV SOLN
600.0000 mg | Freq: Two times a day (BID) | INTRAVENOUS | Status: DC
  Administered 2024-06-05 (×2): 600 mg via INTRAVENOUS
  Filled 2024-06-04 (×3): qty 300

## 2024-06-04 MED ORDER — HYDROCORTISONE SOD SUC (PF) 100 MG IJ SOLR
100.0000 mg | Freq: Once | INTRAMUSCULAR | Status: AC
  Administered 2024-06-05: 100 mg via INTRAVENOUS
  Filled 2024-06-04: qty 2

## 2024-06-04 MED ORDER — ASPIRIN 81 MG PO CHEW: 324.0000 mg | CHEWABLE_TABLET | Freq: Once | ORAL | Status: DC

## 2024-06-04 MED ORDER — CHLORHEXIDINE GLUCONATE CLOTH 2 % EX PADS
6.0000 | MEDICATED_PAD | Freq: Every day | CUTANEOUS | Status: DC
  Administered 2024-06-05: 6 via TOPICAL

## 2024-06-04 MED ORDER — HEPARIN SODIUM (PORCINE) 5000 UNIT/ML IJ SOLN: 5000.0000 [IU] | Freq: Once | INTRAMUSCULAR | Status: DC

## 2024-06-04 MED ORDER — BENZTROPINE MESYLATE 1 MG PO TABS: 1.0000 mg | ORAL_TABLET | Freq: Every day | ORAL | Status: DC | PRN

## 2024-06-04 MED ORDER — PREGABALIN 75 MG PO CAPS
200.0000 mg | ORAL_CAPSULE | Freq: Two times a day (BID) | ORAL | Status: DC
  Administered 2024-06-04 – 2024-06-05 (×2): 200 mg via ORAL
  Filled 2024-06-04 (×2): qty 1

## 2024-06-04 MED ORDER — LACTATED RINGERS IV SOLN: INTRAVENOUS | Status: DC

## 2024-06-04 MED ORDER — MORPHINE SULFATE (PF) 2 MG/ML IV SOLN: 2.0000 mg | INTRAVENOUS | Status: DC | PRN

## 2024-06-04 MED ORDER — MAGNESIUM OXIDE 400 MG PO TABS
1000.0000 mg | ORAL_TABLET | Freq: Two times a day (BID) | ORAL | Status: DC
  Administered 2024-06-04 – 2024-06-05 (×2): 1000 mg via ORAL
  Filled 2024-06-04 (×2): qty 3
  Filled 2024-06-04 (×2): qty 2.5

## 2024-06-04 MED ORDER — SODIUM CHLORIDE 0.9% FLUSH
10.0000 mL | Freq: Two times a day (BID) | INTRAVENOUS | Status: DC
  Administered 2024-06-04 – 2024-06-05 (×2): 10 mL

## 2024-06-04 MED ORDER — SODIUM CHLORIDE 0.9 % IV SOLN
2.0000 g | Freq: Three times a day (TID) | INTRAVENOUS | Status: DC
  Administered 2024-06-04 – 2024-06-05 (×2): 2 g via INTRAVENOUS
  Filled 2024-06-04 (×3): qty 12.5

## 2024-06-04 MED ORDER — LACTATED RINGERS IV BOLUS (SEPSIS)
1000.0000 mL | Freq: Once | INTRAVENOUS | Status: AC
  Administered 2024-06-04: 1000 mL via INTRAVENOUS

## 2024-06-04 MED ORDER — SODIUM CHLORIDE 0.9 % IV BOLUS: 1000.0000 mL | Freq: Once | INTRAVENOUS | Status: DC

## 2024-06-04 MED ORDER — AZO CRANBERRY 250-30 MG PO TABS: 1.0000 | ORAL_TABLET | Freq: Every day | ORAL | Status: DC

## 2024-06-04 MED ORDER — PREDNISONE 10 MG PO TABS
30.0000 mg | ORAL_TABLET | Freq: Every day | ORAL | Status: DC
  Administered 2024-06-05: 30 mg via ORAL
  Filled 2024-06-04: qty 1

## 2024-06-04 MED ORDER — METHOCARBAMOL 500 MG PO TABS: 500.0000 mg | ORAL_TABLET | Freq: Three times a day (TID) | ORAL | Status: DC | PRN

## 2024-06-04 MED ORDER — SODIUM CHLORIDE 0.9% FLUSH: 10.0000 mL | INTRAVENOUS | Status: DC | PRN

## 2024-06-04 MED ORDER — INSULIN GLARGINE 100 UNIT/ML ~~LOC~~ SOLN
35.0000 [IU] | Freq: Every day | SUBCUTANEOUS | Status: DC
  Administered 2024-06-04: 35 [IU] via SUBCUTANEOUS
  Filled 2024-06-04 (×2): qty 0.35

## 2024-06-04 MED ORDER — LACTATED RINGERS IV BOLUS
1500.0000 mL | Freq: Once | INTRAVENOUS | Status: AC
  Administered 2024-06-04: 1500 mL via INTRAVENOUS

## 2024-06-04 MED ORDER — ENOXAPARIN SODIUM 60 MG/0.6ML IJ SOSY
0.5000 mg/kg | PREFILLED_SYRINGE | INTRAMUSCULAR | Status: DC
  Administered 2024-06-04: 55 mg via SUBCUTANEOUS
  Filled 2024-06-04: qty 0.6

## 2024-06-04 MED ORDER — VENLAFAXINE HCL ER 75 MG PO CP24
75.0000 mg | ORAL_CAPSULE | Freq: Every day | ORAL | Status: DC
  Administered 2024-06-05: 75 mg via ORAL
  Filled 2024-06-04: qty 1

## 2024-06-04 MED ORDER — ALBUTEROL SULFATE (2.5 MG/3ML) 0.083% IN NEBU: 3.0000 mL | INHALATION_SOLUTION | RESPIRATORY_TRACT | Status: DC | PRN

## 2024-06-04 MED ORDER — TACROLIMUS 1 MG PO CAPS
1.0000 mg | ORAL_CAPSULE | Freq: Every morning | ORAL | Status: DC
Start: 2024-06-05 — End: 2024-06-05
  Administered 2024-06-05: 1 mg via ORAL
  Filled 2024-06-04: qty 1

## 2024-06-04 MED ORDER — MIRTAZAPINE 15 MG PO TABS: 15.0000 mg | ORAL_TABLET | Freq: Every day | ORAL | Status: DC

## 2024-06-04 MED ORDER — INSULIN ASPART 100 UNIT/ML IJ SOLN
0.0000 [IU] | Freq: Three times a day (TID) | INTRAMUSCULAR | Status: DC
  Administered 2024-06-05: 9 [IU] via SUBCUTANEOUS
  Administered 2024-06-05: 3 [IU] via SUBCUTANEOUS
  Filled 2024-06-04 (×2): qty 1

## 2024-06-04 MED ORDER — MELATONIN 5 MG PO TABS
10.0000 mg | ORAL_TABLET | Freq: Every day | ORAL | Status: DC
  Administered 2024-06-04: 10 mg via ORAL
  Filled 2024-06-04: qty 2

## 2024-06-04 MED ORDER — DIPHENHYDRAMINE HCL 50 MG/ML IJ SOLN: 12.5000 mg | Freq: Three times a day (TID) | INTRAMUSCULAR | Status: DC | PRN

## 2024-06-04 MED ORDER — VENLAFAXINE HCL ER 37.5 MG PO CP24
37.5000 mg | ORAL_CAPSULE | Freq: Every morning | ORAL | Status: DC
Start: 2024-06-05 — End: 2024-06-05
  Administered 2024-06-05: 37.5 mg via ORAL
  Filled 2024-06-04: qty 1

## 2024-06-04 MED ORDER — HYDRALAZINE HCL 20 MG/ML IJ SOLN: 5.0000 mg | INTRAMUSCULAR | Status: DC | PRN

## 2024-06-04 MED ORDER — BACLOFEN 10 MG PO TABS
10.0000 mg | ORAL_TABLET | Freq: Two times a day (BID) | ORAL | Status: DC
  Administered 2024-06-04 – 2024-06-05 (×2): 10 mg via ORAL
  Filled 2024-06-04 (×2): qty 1

## 2024-06-04 MED ORDER — MIRTAZAPINE 15 MG PO TABS
15.0000 mg | ORAL_TABLET | Freq: Every day | ORAL | Status: DC
Start: 2024-06-04 — End: 2024-06-05
  Administered 2024-06-04: 15 mg via ORAL
  Filled 2024-06-04: qty 1

## 2024-06-04 MED ORDER — ROSUVASTATIN CALCIUM 10 MG PO TABS
10.0000 mg | ORAL_TABLET | Freq: Every day | ORAL | Status: DC
  Administered 2024-06-05: 10 mg via ORAL
  Filled 2024-06-04: qty 1

## 2024-06-04 MED ORDER — ACETAMINOPHEN 325 MG PO TABS: 325.0000 mg | ORAL_TABLET | Freq: Four times a day (QID) | ORAL | Status: DC | PRN

## 2024-06-04 MED ORDER — PANTOPRAZOLE SODIUM 40 MG PO TBEC
40.0000 mg | DELAYED_RELEASE_TABLET | Freq: Every day | ORAL | Status: DC
  Administered 2024-06-05: 40 mg via ORAL
  Filled 2024-06-04: qty 1

## 2024-06-04 NOTE — ED Triage Notes (Signed)
 Had mechanical fall and tripped over her service animal. C/o left ankle pain. Reports while being seen for fall at [facility] they found fever. Liver transplant X10 years ago.  KC vitals: 136/95 b/p 80HR 101.6 oral

## 2024-06-04 NOTE — ED Provider Notes (Signed)
 [facility] Provider Note    Event Date/Time   First MD Initiated Contact with Patient 06/04/24 1523     (approximate)   History   Fall   HPI  Alison [name] is a 48 y.o. female with a history of morbid obesity, lymphoma, SLE s/p liver transplant, currently on tacrolimus , neuropathy, major depressive disorder, and sleep apnea presents who presents with left ankle pain as well as fever.  The patient states that she twisted her left ankle about 3 weeks ago.  She reports persistent pain although she is able to bear weight.  She denies any additional injuries.  She states that she went to the walk-in clinic today to have this evaluated, but was found to have a fever of 101 and was sent to the ED for further evaluation.  The patient denies any other acute symptoms.  She has no nausea or vomiting.  She denies diarrhea, cough, shortness of breath, nasal congestion, rhinorrhea, sore throat, headache, neck stiffness.  I reviewed the past medical records.  The patient's most recent outpatient encounters were on 8/27 with rheumatology for follow-up of her SLE and 8/20 with endocrinology for follow-up of her diabetes.  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  She was admitted in June with concern for viral pneumonia and sepsis.   Physical Exam   Triage Vital Signs: ED Triage Vitals  Encounter Vitals Group     BP 06/04/24 1508 (!) 160/107     Girls Systolic BP Percentile --      Girls Diastolic BP Percentile --      Boys Systolic BP Percentile --      Boys Diastolic BP Percentile --      Pulse Rate 06/04/24 1508 (!) 105     Resp 06/04/24 1508 17     Temp 06/04/24 1508 99.9 F (37.7 C)     Temp Source 06/04/24 1508 Oral     SpO2 06/04/24 1508 93 %     Weight 06/04/24 1509 244 lb (110.7 kg)     Height 06/04/24 1509 5' 6 (1.676 m)     Head Circumference --      Peak Flow --      Pain Score  06/04/24 1508 0     Pain Loc --      Pain Education --      Exclude from Growth Chart --     Most recent vital signs: Vitals:   06/04/24 1930 06/04/24 2117  BP: 117/72 124/71  Pulse: 79 81  Resp: 18 17  Temp:  98.6 F (37 C)  SpO2: 98% 94%     General: Awake, no distress.  CV:  Good peripheral perfusion. Resp:  Normal effort.  Lungs CTAB. Abd:  No distention.  Other:  Slightly diaphoretic.  Left ankle with minimal swelling.  Mild bilateral tenderness.  No deformity.  2+ DP pulse.  Normal motor function and sensation distally.   ED Results / Procedures / Treatments   Labs (all labs ordered are listed, but only abnormal results are displayed) Labs Reviewed  CBC - Abnormal; Notable for the following components:      Result Value   WBC 13.7 (*)    All other components within normal limits  COMPREHENSIVE METABOLIC PANEL WITH GFR - Abnormal; Notable for the following components:   Sodium 134 (*)    Chloride 96 (*)    Glucose, Bld 301 (*)  BUN 23 (*)    All other components within normal limits  LACTIC ACID, PLASMA - Abnormal; Notable for the following components:   Lactic Acid, Venous 2.3 (*)    All other components within normal limits  URINALYSIS, ROUTINE W REFLEX MICROSCOPIC - Abnormal; Notable for the following components:   Color, Urine YELLOW (*)    APPearance CLEAR (*)    Glucose, UA >=500 (*)    Protein, ur >=300 (*)    Leukocytes,Ua TRACE (*)    All other components within normal limits  LIPID PANEL - Abnormal; Notable for the following components:   Triglycerides 150 (*)    All other components within normal limits  GLUCOSE, CAPILLARY - Abnormal; Notable for the following components:   Glucose-Capillary 281 (*)    All other components within normal limits  RESP PANEL BY RT-PCR (RSV, FLU A&B, COVID)  RVPGX2  CULTURE, BLOOD (ROUTINE X 2)  CULTURE, BLOOD (ROUTINE X 2)  PROCALCITONIN  HEMOGLOBIN A1C  APTT  PROTIME-INR  C-REACTIVE PROTEIN   SEDIMENTATION RATE  PREGNANCY, URINE  BASIC METABOLIC PANEL WITH GFR  CBC  LACTIC ACID, PLASMA  LACTIC ACID, PLASMA  TROPONIN I (HIGH SENSITIVITY)     EKG     RADIOLOGY  Chest x-ray: I independently viewed and interpreted the images; there is no focal consolidation or edema  Left ankle x-ray: Possible small avulsion fracture adjacent to the medial malleolus   PROCEDURES:  Critical Care performed: No  Procedures   MEDICATIONS ORDERED IN ED: Medications  albuterol  (PROVENTIL ) (2.5 MG/3ML) 0.083% nebulizer solution 3 mL (has no administration in time range)  diphenhydrAMINE  (BENADRYL ) injection 12.5 mg (has no administration in time range)  hydrALAZINE  (APRESOLINE ) injection 5 mg (has no administration in time range)  acetaminophen  (TYLENOL ) tablet 325 mg (has no administration in time range)  oxyCODONE  (Oxy IR/ROXICODONE ) immediate release tablet 5 mg (has no administration in time range)  linezolid  (ZYVOX ) IVPB 600 mg (has no administration in time range)  insulin  aspart (novoLOG ) injection 0-9 Units (has no administration in time range)  lactated ringers  bolus 1,500 mL (has no administration in time range)  lactated ringers  infusion (has no administration in time range)  ceFEPIme  (MAXIPIME ) 2 g in sodium chloride  0.9 % 100 mL IVPB (has no administration in time range)  enoxaparin  (LOVENOX ) injection 55 mg (has no administration in time range)  morphine  (PF) 2 MG/ML injection 2 mg (has no administration in time range)  methocarbamol  (ROBAXIN ) tablet 500 mg (has no administration in time range)  rosuvastatin  (CRESTOR ) tablet 10 mg (has no administration in time range)  lurasidone  (LATUDA ) tablet 20 mg (has no administration in time range)  mirtazapine  (REMERON ) tablet 15 mg (has no administration in time range)  venlafaxine  XR (EFFEXOR -XR) 24 hr capsule 37.5 mg (has no administration in time range)  venlafaxine  XR (EFFEXOR -XR) 24 hr capsule 75 mg (has no administration  in time range)  magnesium  oxide (MAG-OX) tablet 1,000 mg (has no administration in time range)  pantoprazole  (PROTONIX ) EC tablet 40 mg (has no administration in time range)  melatonin tablet 10 mg (has no administration in time range)  baclofen  (LIORESAL ) tablet 10 mg (has no administration in time range)  pregabalin  (LYRICA ) capsule 200 mg (has no administration in time range)  tacrolimus  (PROGRAF ) capsule 1 mg (has no administration in time range)  insulin  glargine (LANTUS ) injection 35 Units (has no administration in time range)  lactated ringers  bolus 1,000 mL (0 mLs Intravenous Stopped 06/04/24 1912)  IMPRESSION / MDM / ASSESSMENT AND PLAN / ED COURSE  I reviewed the triage vital signs and the nursing notes.  48 year old female with PMH as noted above presents with subacute left ankle pain after an injury 3 weeks ago, along with fever of unknown origin.  Her review of systems is otherwise negative.  Differential diagnosis includes, but is not limited to, COVID-19 or other viral syndrome, UTI, pneumonia, other acute infection.  The patient is borderline tachycardic.  We will obtain labs, sepsis workup, x-rays of the ankle, and reassess.  Patient's presentation is most consistent with acute presentation with potential threat to life or bodily function.  The patient is on the cardiac monitor to evaluate for evidence of arrhythmia and/or significant heart rate changes.  ----------------------------------------- 9:46 PM on 06/04/2024 -----------------------------------------  Workup is overall reassuring.  Chest x-ray is clear.  Ankle x-ray shows a very small possible avulsion fracture.  The patient's lactic acid was elevated.  She has mild leukocytosis.  Other labs are unremarkable.  Respiratory panel was negative.  Urinalysis does not show evidence of UTI.  I elected to hold off on antibiotics until a source of infection was identified.  Given her liver transplant and  immunocompromised status, along with abnormal labs, I felt that she would benefit from inpatient admission for further monitoring.  I consulted and discussed the case with Dr. Hilma from the hospitalist service; based on our discussion he agreed to evaluate the patient for admission.  He recommended starting empiric antibiotics and has ordered them.  Of note, at approximately 8:05 PM I accidentally placed STEMI order set orders on this patient that were intended for another patient, including medications.  I identified this mistake canceled the orders, and immediately alerted the patient's RN; this was all prior to any medications being given.   FINAL CLINICAL IMPRESSION(S) / ED DIAGNOSES   Final diagnoses:  Febrile illness     Rx / DC Orders   ED Discharge Orders     None        Note:  This document was prepared using Dragon voice recognition software and may include unintentional dictation errors.    Jacolyn Pae, MD 06/04/24 2149

## 2024-06-04 NOTE — Patient Outreach (Signed)
 Complex Care Management   Visit Note  06/04/2024  Name:  Alison [name] MRN: [id number] DOB: 04-09-1976  Situation: Referral received for Complex Care Management related to Diabetes with Complications I obtained verbal consent from Patient.  Visit completed with Patient  on the phone  Background:   Past Medical History:  Diagnosis Date   Abnormal uterine bleeding    Allergy    Anxiety    Arthritis    Bipolar disorder (manic depression) (HCC)    Cancer (HCC) Diffuse B-Cell Lymphoma   2015   Chest pain    a. 09/2021 MV: Low risk without ischemia or infarct.  Normal EF.   Chronic kidney failure    Chronic renal disease, stage III (HCC)    Diabetes mellitus without complication (HCC)    DLBCL (diffuse large B cell lymphoma) (HCC) 2015   Right axillary lymph node resected and chemo tx's.   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 02/2023 Echo: EF 55-60%, no rwma, nl RV size/fxn. Mildly dil LA.   Personality disorder (HCC)    Pineal gland cyst    Post herpetic neuralgia    PTSD (post-traumatic stress disorder)    RBBB    Renal disorder    S/P liver transplant (HCC) 1993   Sleep apnea    has not recieved cpap yet-other one was recalled    Assessment: Patient Reported Symptoms:  Cognitive Cognitive Status: Struggling with memory recall, Alert and oriented to person, place, and time, Insightful and able to interpret abstract concepts, Normal speech and language skills (Word finding issues - gets occasionally) Cognitive/Intellectual Conditions Management [RPT]: None  reported or documented in medical history or problem list   Health Maintenance Behaviors: Annual physical exam Healing Pattern: Slow Health Facilitated by: Pain control, Rest  Neurological   Neurological Comment: neuropathy, no longer blurry vision  HEENT HEENT Symptoms Reported: No symptoms reported      Cardiovascular Cardiovascular Symptoms Reported: No symptoms reported    Respiratory Additional Respiratory Details: SOB w/standing Respiratory Management Strategies: Routine screening, Medication therapy  Endocrine Endocrine Symptoms Reported: No symptoms reported, Hypoglycemia, Shakiness Is patient diabetic?: Yes Is patient checking blood sugars at home?: Yes List most recent blood sugar readings, include date and time of day: FBG 68 - treated appropriately,  ranges 68-250 highet at night 389, discussed small protein snack before bed since states experiencing lows,    Gastrointestinal Gastrointestinal Symptoms Reported: No symptoms reported      Genitourinary Genitourinary Symptoms Reported: No symptoms reported    Integumentary Integumentary Symptoms Reported: No symptoms reported    Musculoskeletal Musculoskelatal Symptoms Reviewed: Muscle pain Additional Musculoskeletal Details: ibromyalgia, fell about 3 Catheryn Slifer ago, left ankle remains swollen, RNCM will notify PCP of ankle and increased leg cramps Musculoskeletal Management Strategies: Medication therapy, Routine screening Falls in the past year?: Yes Number of falls in past year: 2 or more Was there an injury with Fall?: Yes  Fall Risk Category Calculator: 3 Patient Fall Risk Level: High Fall Risk Patient at Risk for Falls Due to: History of fall(s), Impaired balance/gait, Impaired mobility Fall risk Follow up: Falls evaluation completed, Education provided, Falls prevention discussed  Psychosocial Psychosocial Symptoms Reported: Anxiety - if selected complete GAD Additional Psychological Details: counseling -  helpful Behavioral Management Strategies: Medication therapy, Counseling Major Change/Loss/Stressor/Fears (CP): Medical condition, self Quality of Family Relationships: helpful, involved, supportive Do you feel physically threatened by others?: No    06/04/2024    PHQ2-9 Depression Screening   Little interest or pleasure in doing things Several days  Feeling down, depressed, or hopeless Several days  PHQ-2 - Total Score 2  Trouble falling or staying asleep, or sleeping too much More than half the days  Feeling tired or having little energy Not at all  Poor appetite or overeating  Not at all  Feeling bad about yourself - or that you are a failure or have let yourself or your family down Not at all  Trouble concentrating on things, such as reading the newspaper or watching television Several days  Moving or speaking so slowly that other people could have noticed.  Or the opposite - being so fidgety or restless that you have been moving around a lot more than usual    Thoughts that you would be better off dead, or hurting yourself in some way    PHQ2-9 Total Score 5  If you checked off any problems, how difficult have these problems made it for you to do your work, take care of things at home, or get along with other people    Depression Interventions/Treatment Currently on Treatment, Counseling, Medication    Vitals:   06/04/24 1354  BP: 128/78    Medications Reviewed Today     Reviewed by Alison Lands, Alison [name] (Registered Nurse) on 06/04/24 at 1350  Med List Status: <None>   Medication Order Taking? Sig Documenting Provider Last Dose Status Informant  Accu-Chek Softclix Lancets lancets [id number] Yes 2 (two) times daily. [provider]  Active   albuterol  (VENTOLIN  HFA) 108 (90 Base) MCG/ACT inhaler [id number] Yes Inhale 2 puffs into the lungs every 6 (six) hours as needed for wheezing or shortness of breath. Gareth Mliss FALCON, FNP  Active Self           Med Note [facility],  ELIZABETH A   Mon Mar 15, 2024 10:57 AM) PRN  baclofen  (LIORESAL ) 10 MG tablet [id number] Yes Take 1 tablet by mouth 2 (two) times daily. [provider]  Active Self  benztropine  (COGENTIN ) 1 MG tablet [id number] Yes TAKE 1 TABLET BY MOUTH ONCE DAILY AS NEEDED FOR TREMORS [name], [name], MD  Active Self  Continuous Glucose Sensor (FREESTYLE LIBRE 3 PLUS SENSOR) MISC [id number] Yes CHANGE SENSOR EVERY 15 DAYS Motwani, Komal, MD  Active   Cranberry-Vitamin C-Probiotic (AZO CRANBERRY PO) [id number] Yes Take by mouth. [provider]  Active   fluticasone  (FLONASE ) 50 MCG/ACT nasal spray [id number] Yes Place into the nose. [provider]  Active Self           Med Note SIGRID, TIFFANY N   Sun Apr 11, 2024 11:59 PM) PRN  glipiZIDE  (GLUCOTROL  XL) 10 MG 24 hr tablet [id number] Yes TAKE 1 TABLET BY MOUTH IN THE MORNING AND AT BEDTIME Dartha Ernst, MD  Active Self  glucose blood (RELION GLUCOSE TEST STRIPS) test strip [id number] Yes Use as instructed Gareth Mliss FALCON, FNP  Active   insulin  glargine (LANTUS   SOLOSTAR) 100 UNIT/ML Solostar Pen [id number] Yes Inject 40 Units into the skin 2 (two) times daily. Fausto Sor A, DO  Active   lisinopril  (ZESTRIL ) 20 MG tablet [id number] Yes TAKE 1 TABLET BY MOUTH ONCE DAILY *PATIENT WILL NEED TO SCHEDULE AN APPOINTMENT FOR ADDITIONAL REFILLS*  Patient taking differently: Patient states restarted because BP was elevated, restarted about 1 week ago   Gareth Mliss FALCON, FNP  Active Self  lurasidone  (LATUDA ) 20 MG TABS tablet [id number] Yes Take 1 tablet (20 mg total) by mouth daily with supper. [name], [name], MD  Active   magnesium  oxide (MAG-OX) 400 MG tablet [id number] Yes Take 1,000 mg by mouth 2 (two) times daily. [provider]  Active Self  Melatonin 10 MG TABS [id number] Yes Take 10 mg by mouth at bedtime. [provider]  Active Self  metFORMIN  (GLUCOPHAGE ) 1000 MG tablet [id number] Yes TAKE 1 TABLET BY MOUTH TWICE DAILY  WITH MEALS *PATIENT WILL NEED TO SCHEDULE APPOINTMENT FOR ADDITIONAL REFILLS* Gareth Mliss FALCON, FNP  Active Self  mirabegron  ER (MYRBETRIQ ) 50 MG TB24 tablet [id number] Yes Take 1 tablet (50 mg total) by mouth daily. Gareth Mliss FALCON, FNP  Active Self  mirtazapine  (REMERON ) 15 MG tablet [id number] Yes TAKE 1 TABLET BY MOUTH AT BEDTIME [name], [name], MD  Active   naloxone  (NARCAN ) nasal spray 4 mg/0.1 mL [id number] Yes Place 0.4 mg into the nose once. [provider]  Active Self           Med Note GROVER, SOR S   Wed Oct 15, 2023  1:51 PM) prn  NOVOLOG  FLEXPEN 100 UNIT/ML FlexPen [id number] Yes Inject 40 Units into the skin 3 (three) times daily with meals. Hold if not eating at least half of a full meal. Fausto Sor LABOR, DO  Active   ondansetron  (ZOFRAN ) 4 MG tablet [id number] Yes Take 1 tablet (4 mg total) by mouth every 6 (six) hours as needed for nausea. Caleen Qualia, MD  Active Self  oxyCODONE  (OXY IR/ROXICODONE ) 5 MG immediate release tablet [id number] Yes Take 5 mg by mouth 3 (three) times daily as needed. [provider]  Active Self  pantoprazole  (PROTONIX ) 40 MG tablet [id number] Yes Take 1 tablet (40 mg total) by mouth daily. Recommend using while on Prednisone . Fausto Sor LABOR, DO  Active   PRECISION QID TEST test strip [id number] Yes  [provider]  Active   predniSONE  (DELTASONE ) 50 MG tablet [id number] Yes Take one tablet (50 mg) by mouth once daily until follow up with Neurology.  Patient taking differently: 40 mg. Currently taking 30 mg qd   Fausto Sor LABOR, DO  Active   pregabalin  (LYRICA ) 200 MG capsule [id number] Yes Take 1 capsule (200 mg total) by mouth 2 (two) times daily. Floy Roberts, MD  Active Self  rosuvastatin  (CRESTOR ) 10 MG tablet [id number] Yes TAKE 1 TABLET BY MOUTH ONCE DAILY Darryle Thom CROME, PA-C  Active   Semaglutide , 2 MG/DOSE, 8 MG/3ML SOPN [id number] Yes Inject 2 mg as directed once a week. Dartha Ernst, MD  Active   SURE  COMFORT PEN NEEDLES 32G X 6 MM MISC [id number] Yes USE AS DIRECTED Pender, Julie F, FNP  Active   tacrolimus  (PROGRAF ) 1 MG capsule [id number] Yes Take 1 capsule by mouth every morning. [provider]  Active Self  TYLENOL  PM EXTRA STRENGTH 1000-50 MG/30ML LIQD [id number] Yes Take by mouth. [provider]  Active Self           Med  Note (CORPREW, TIFFANY N   Mon Apr 12, 2024 12:01 AM) PRN  UBRELVY  50 MG TABS [id number] Yes Take 1 tablet by mouth daily as needed. [provider]  Active Self  venlafaxine  XR (EFFEXOR -XR) 37.5 MG 24 hr capsule [id number] Yes Take 1 capsule (37.5 mg total) by mouth every morning. Take along with 75 mg daily [name], [name], MD  Active   venlafaxine  XR (EFFEXOR -XR) 75 MG 24 hr capsule [id number] Yes Take 1 capsule (75 mg total) by mouth daily with breakfast. Take along with 37.5 mg daily [name], [name], MD  Active Self            Recommendation:   PCP Follow-up Continue Current Plan of Care  Follow Up Plan:   Telephone follow-up in 1 month  [name], MSN, Alison [name] [facility]  [facility], [facility] Alison [name] Care Manager Direct Dial: [phone] Fax: [phone]

## 2024-06-04 NOTE — Patient Instructions (Signed)
 Visit Information  Alison [name] was given information about Medicaid Managed Care team care coordination services as a part of their Healthy [facility] Medicaid benefit. Alison [name]   If you would like to schedule transportation through your Healthy [facility] plan, please call the following number at least 2 days in advance of your appointment: [phone]  For information about your ride after you set it up, call Ride Assist at [phone]. Use this number to activate a Will Call pickup, or if your transportation is late for a scheduled pickup. Use this number, too, if you need to make a change or cancel a previously scheduled reservation.  If you need transportation services right away, call [phone]. The after-hours call center is staffed 24 hours to handle ride assistance and urgent reservation requests (including discharges) 365 days a year. Urgent trips include sick visits, hospital discharge requests and life-sustaining treatment.  Call the [facility] Line at [phone], at any time, 24 hours a day, 7 days a week. If you are in danger or need immediate medical attention call 911.   Alison [name] - following are the goals we discussed in your visit today:    Visit Information  Alison [name] was given information about Medicaid Managed Care team care coordination services as a part of their Healthy [facility] Medicaid benefit. Alison [name]   If you would like to schedule transportation through your Healthy [facility] plan, please call the following number at least 2 days in advance of your appointment: [phone]  For information about your ride after you set it up, call Ride Assist at [phone]. Use this number to activate a Will Call pickup, or if your transportation is late for a scheduled pickup. Use this number, too, if you need to make a change or cancel a previously scheduled reservation.  If you need transportation services right away, call [phone].  The after-hours call center is staffed 24 hours to handle ride assistance and urgent reservation requests (including discharges) 365 days a year. Urgent trips include sick visits, hospital discharge requests and life-sustaining treatment.  Call the [facility] Line at [phone], at any time, 24 hours a day, 7 days a week. If you are in danger or need immediate medical attention call 911.   Alison [name] - following are the goals we discussed in your visit today:   Goals Addressed             This Visit's Progress    VBCI RN Care Plan - DM Insulin  Dependent   On track    Problems:  Chronic Disease Management support and education needs related to DMII  Goal: Over the next 90 days the Patient will attend all scheduled medical appointments: PPC and specialist as evidenced by chart review, no missed appointments        continue to work with RN Care Manager and/or Social Worker to address care management and care coordination needs related to DMII as evidenced by adherence to care management team scheduled appointments     take all medications exactly as prescribed and will call provider for medication related questions as evidenced by verbalizing compliance with all medications, specifically use of basal and short acting insulin     verbalize basic understanding of DMII disease process and self health management plan as evidenced by taking insulin  and medications as prescribed, checking BG at least fasting and before meals, then as advised by Endo at appointment 04/05/24, calling provider with questions or concerns, attending Endocrine appointment  and any recommended classes related to DM, lifestyle modifications   Interventions:   Diabetes Interventions: Assessed patient's understanding of A1c goal: <7%   Currently 9.7 Provided education to patient about basic DM disease process - Discussed how DM affects other systems and importance of controlling BG Reviewed medications with  patient and discussed importance of medication adherence Patient compliant with all meds Discussed plans with patient for ongoing care management follow up and provided patient with direct contact information for care management team patient will call for PCP and Vascular appointments Provided patient with written educational materials related to hypo and hyperglycemia and importance of correct treatment; Patient able to verbalized correct treatment of hypoglycemia, getting glucose tabs as discussed Advised patient, providing education and rationale, to check FBG and before meals and record, calling PCP for findings outside established parameters. Reported some lows in am - discussed healthy bprotein snack before bed and if continues to notify PCP for possible med adjustment Screening for signs and symptoms of depression related to chronic disease state Seeing counselor, participating in hobbies and will be attending Board Game group this weekend.  Assessed social determinant of health barriers Lab Results  Component Value Date   HGBA1C 9.7 (H) 04/01/2024   Provided support and encouragement, patient now checking BG regularly, taking insulin  and motivated to make lifestyle changes to improve health and wellbeing. Discussed resources for healthy eating and carb counting including Carb Counting apps, Recipe apps for quick healthy meals, [employer] YouTube since patient is vegan, meal prep to help patient make healthy choices throughout week, healthy snacks  Messaging PCP regarding leg cramps despite magnesium , fall 3 [name] ago and ankle still swollen, denies pain and walking normally.   Patient Self-Care Activities:  Attend all scheduled provider appointments Call pharmacy for medication refills 3-7 days in advance of running out of medications Call provider office for new concerns or questions  Take medications as prescribed   check blood sugar at prescribed times: At least FBG and before meals  then as directed by endo at appointment 6/30 enter blood sugar readings and medication or insulin  into daily log take the blood sugar log to all doctor visits  Plan:  Telephone follow up appointment with care management team member scheduled for:  07/02/24 at 1:30 pm             Please see education materials related to Carb Counting provided by MyChart link.  Patient verbalizes understanding of instructions and care plan provided today and agrees to view in MyChart. Active MyChart status and patient understanding of how to access instructions and care plan via MyChart confirmed with patient.     Telephone follow up appointment with Managed Medicaid care management team member scheduled for:  [name], MSN, RN [facility]  [facility] RN Care Manager Direct Dial: [phone] Fax: [phone]   Hypoglycemia Hypoglycemia is when the amount of sugar, or glucose, in your blood is too low. Low blood sugar can happen if you have diabetes or if you don't have diabetes. It may be an emergency. What are the causes? Low blood sugar happens most often in people who have diabetes. It may be caused by: Diabetes medicine. Not eating enough, or not eating often enough. Being more active than normal. If you don't have diabetes, you may still get low blood sugar if: There's a tumor in your pancreas. A tumor is a growth of cells that isn't normal. You don't eat enough, or you fast. Fasting  is when you don't eat for long periods at a time. You have a bad infection or illness. You have problems after weight loss surgery. You have kidney or liver problems. You take certain medicines. What increases the risk? You're more likely to have low blood sugar if: You have diabetes and take medicine for it. You drink a lot of alcohol. You get sick. What are the signs or symptoms? Mild Hunger or feeling like you may vomit. Sweating and feeling cold to the  touch. Feeling dizzy or light-headed. Being sleepy or having trouble sleeping. A headache. Blurry vision. Mood changes. These include feeling worried, nervous, or easily annoyed. Moderate Feeling confused. Changes in the way you act. Weakness. An uneven heartbeat. Very bad Having very low blood sugar is an emergency. It can cause: Fainting. Seizures. A coma. Death. How is this diagnosed?  Low blood sugar can be found with a blood test. This test tells you how much sugar is in your blood. It's done while you're having symptoms. Your health care provider may also do an exam and look at your medical history. How is this treated? Treating low blood sugar If you have low blood sugar, eat or drink something with sugar in it right away. The food or drink should have 15 grams of a fast-acting carbohydrate (carb). Options include: 4 oz (120 mL) of fruit juice. 4 oz (120 mL) of soda (not diet soda). A few pieces of hard candy. Check food labels to see how many pieces to eat. 1 Tbsp (15 mL) of sugar or honey. 4 glucose tablets. 1 tube of glucose gel. Treating low blood sugar if you have diabetes Talk with your provider about how much carb you should take. If you're alert and can swallow safely, you may follow the 15:15 rule: Take 15 grams of a fast-acting carb. Check your blood sugar 15 minutes after you take the carb. If your blood sugar is still at or below 70 mg/dL (3.9 mmol/L), take 15 grams of a carb again. If your blood sugar doesn't go above 70 mg/dL (3.9 mmol/L) after 3 tries, get help right away. After your blood sugar goes back to normal, eat a meal or a snack within 1 hour. Always keep 15 grams of a fast-acting carb with you. This could be: 4 glucose tablets. A few pieces of hard candy. 1 Tbsp (15 mL) of honey or sugar. 1 tube of glucose gel. Treating very low blood sugar If your blood sugar is less than 54 mg/dL (3 mmol/L), it's an emergency. Get help right away. If you  can't eat or drink, you will need to be given glucagon. A family member or friend should learn how to check your blood sugar and give you glucagon. Ask your provider if you should keep a glucagon kit at home. You may also need to be treated in a hospital. Follow these instructions at home: If you have diabetes: Always keep a fast-acting carb (15 grams) with you. Follow your diabetes care plan. Make sure you: Know the symptoms of low blood sugar. Check your blood sugar as often as told. Always check it before and after you exercise. Always check your blood sugar before you drive. Take your medicines as told. Eat on time. Do not skip meals. Share your diabetes care plan with: Your work or school. The people you live with. Wear an alert bracelet or carry a card that says you have diabetes. General instructions If you drink alcohol: Limit how much you have  to: 0-1 drink a day if you're female. 0-2 drinks a day if you're female. Know how much alcohol is in your drink. In the [place] one drink is one 12 oz bottle of beer (355 mL), one 5 oz glass of wine (148 mL), or one 1 oz glass of hard liquor (44 mL). Be sure to eat food when you drink alcohol. Be sure to check your blood sugar after you drink. Alcohol may lead to low blood sugar later. Where to find more information American Diabetes Association (ADA): diabetes.org Contact a health care provider if: You have low blood sugar often. You have diabetes and are having trouble keeping your blood sugar in the right range. Get help right away if: You can't get your blood sugar above 70 mg/dL (3.9 mmol/L) after 3 tries. Your blood sugar is below 54 mg/dL (3 mmol/L). You have a seizure. You faint. These symptoms may be an emergency. Call 911 right away. Do not wait to see if the symptoms will go away. Do not drive yourself to the hospital. This information is not intended to replace advice given to you by your health care provider. Make sure you  discuss any questions you have with your health care provider. Document Revised: 06/26/2023 Document Reviewed: 12/11/2022 Elsevier Patient Education  2024 Elsevier Inc.Diabetes: Carbohydrate Counting for Adults Carbohydrate counting is a method of keeping track of how many carbohydrates you eat. Eating carbohydrates increases the amount of sugar, also called glucose, in your blood. By counting how many carbohydrates you eat, you can improve how well you manage your blood sugar. This, in turn, helps you manage your diabetes. Carbohydrates are measured in grams (g) per serving. It's important to know how many carbohydrates (in grams or by serving size) you can have in each meal. This is different for every person. A dietitian can help you make a meal plan and calculate how many carbohydrates you should have at each meal and snack. What foods contain carbohydrates? Carbohydrates are found in these foods: Grains, such as breads and cereals. Dried beans and soy products. Starchy vegetables, such as potatoes, peas, and corn. Fruit and fruit juices. Milk and yogurt. Sweets and snack foods like cake, cookies, candy, chips, and soft drinks. How do I count carbohydrates in foods? There are two ways to count carbohydrates in food. You can read food labels or learn standard serving sizes of foods. You can use either of these methods or a combination of both. Using the Nutrition Facts label The Nutrition Facts list is included on the labels of almost all packaged foods and drinks in the [place] It includes: The serving size. Information about nutrients in each serving. This includes the grams of carbohydrate per serving. To use the Nutrition Facts, decide how many servings you will have. Then, multiply the number of servings by the number of carbohydrates per serving. The resulting number is the total grams of carbohydrates that you'll be having. Learning the standard serving sizes of foods When you eat  carbohydrate foods that aren't packaged or don't include Nutrition Facts on the label, you need to measure the servings in order to count the grams of carbohydrates. Measure the foods that you'll eat with a food scale or measuring cup, if needed. Decide how many standard-size servings you'll eat. Multiply the number of servings by 15. For foods that contain carbohydrates, one serving equals 15 g of carbohydrates. For example, if you eat 2 cups or 10 oz (300 g) of strawberries, you'll have eaten  2 servings and 30 g of carbohydrates (2 servings x 15 g = 30 g). For foods that have more than one food mixed, such as soups and casseroles, you must count the carbohydrates in each food that's included. Here's a list of standard serving sizes for common carbohydrate-rich foods. Each of these servings has about 15 g of carbohydrates: 1 slice of bread. 1 six-inch (15 cm) tortilla. ? cup or 2 oz (53 g) of cooked rice or pasta.  cup or 3 oz (85 g) of cooked or canned, drained, and rinsed beans or lentils.  cup or 3 oz (85 g) of a starchy vegetable, such as peas, corn, or squash.  cup or 4 oz (120 g) of hot cereal.  cup or 3 oz (85 g) of boiled or mashed potatoes, or  or 3 oz (85 g) of a large baked potato.  cup or 4 fl oz (118 mL) of fruit juice. 1 cup or 8 fl oz (237 mL) of milk. 1 small or 4 oz (106 g) apple.  or 2 oz (63 g) of a medium banana. 1 cup or 5 oz (150 g) of strawberries. 3 cups or 1 oz (28.3 g) of popped popcorn. What is an example of carbohydrate counting? To calculate the grams of carbohydrates in this sample meal, follow the steps below. Sample meal 3 oz (85 g) chicken breast. ? cup or 4 oz (106 g) of brown rice.  cup or 3 oz (85 g) of corn. 1 cup or 8 fl oz (237 mL) of milk. 1 cup or 5 oz (150 g) of strawberries with sugar-free whipped topping. Carbohydrate calculation Identify the foods that have carbohydrates: Rice. Corn. Milk. Strawberries. Calculate how many  servings you have of each food: 2 servings of rice. 1 serving of corn. 1 serving of milk. 1 serving of strawberries. Multiply each number of servings by 15 g: 2 servings of rice x 15 g = 30 g. 1 serving of corn x 15 g = 15 g. 1 serving of milk x 15 g = 15 g. 1 serving of strawberries x 15 g = 15 g. Add together all of the amounts to find the total grams of carbohydrates eaten: 30 g + 15 g + 15 g + 15 g = 75 g of carbohydrates total. Where to find more information To learn more, go to: American Diabetes Association at diabetes.org. Click Search and type carb counting. Find the link you need. Centers for Disease Control and Prevention at TonerPromos.no. Click Search and type diabetes. Find the link you need. Academy of Nutrition and Dietetics: eatright.org This information is not intended to replace advice given to you by your health care provider. Make sure you discuss any questions you have with your health care provider. Document Revised: 09/10/2023 Document Reviewed: 09/10/2023 Elsevier Patient Education  2025 [employer].  Following is a copy of your plan of care:  There are no care plans that you recently modified to display for this patient.

## 2024-06-04 NOTE — ED Notes (Addendum)
 IV team at bedside. Lab called for cultures.

## 2024-06-04 NOTE — H&P (Signed)
 History and Physical    Alison [name] FMW:[id number] DOB: 07-06-1976 DOA: 06/04/2024  Referring MD/NP/PA:   PCP: Gareth Mliss FALCON, FNP   Patient coming from:  The patient is coming from home.     Chief Complaint: fever and left ankle pain  HPI: Alison [name] is a 48 y.o. female with medical history significant of lupus, s/p liver transplant on tacrolimus  and prednisone , HTN, HLD, DM, CKD-2, chronic pain syndrome, right bundle blockade, migraine, obesity, lymphoma bilateral parotid gland s/p Rituxan  in 2020, diffuse large B-cell lymphoma, neuropathy, bipolar disorder, depression with anxiety, PTSD, OSA not using CPAP, presents with fever and left ankle pain.   Pt state that she she had fall 3 weeks ago, injured her left ankle.  She developed left ankle pain and swelling initially.  Her left ankle swelling has resolved, but still has some pain.  She states that her pain is mild, aching, nonradiating, aggravated by movement.  No erythema or warmth.  She went to the walk-in [facility] today to have this evaluated. She was found to have fever of 101.6 and was sent to the ED for further evaluation. Her temperature is 99.9 in ED. patient denies subjective fever or chills.  No chest pain, cough, SOB.  No nausea, vomiting, diarrhea or abdominal pain.  No symptoms of UTI. 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  Had negative biopsy for temporal arteritis.  Patient was treated with high-dose steroid.  Data reviewed independently and ED Course: pt was found to have WBC 13.7 (9.3 on 04/13/2024), GFR> 60, lactic acid 2.3 --> 3.1, procalcitonin < 0.10, negative PCR for COVID, flu and RSV, UA negative except for trace amount of leukocyte. temperature 99.9, blood pressure 117/72, heart rate 105 --> 79, RR 18, oxygen saturation 93-98% on room air.  Chest x-ray negative. Pt is admitted to telemetry bed as inpatient.   Left ankle x-ray: Possible tiny  avulsion injury adjacent to the medial malleolus   EKG:  Not done in ED, will get one.    Review of Systems:   General: has fevers (denies subjective fever and chills), no body weight gain, fatigue HEENT: no blurry vision, hearing changes or sore throat Respiratory: no dyspnea, coughing, wheezing CV: no chest pain, no palpitations GI: no nausea, vomiting, abdominal pain, diarrhea, constipation GU: no dysuria, burning on urination, increased urinary frequency, hematuria  Ext: no leg edema Neuro: no unilateral weakness, numbness, or tingling, no vision change or hearing loss Skin: no rash, no skin tear. MSK: No muscle spasm, no deformity, no limitation of range of movement in spin. Has minimal left ankle pain Heme: No easy bruising.  Travel history: No recent long distant travel.   Allergy:  Allergies  Allergen Reactions   Penicillin G Itching, Rash, Anaphylaxis and Palpitations   Lamictal  [Lamotrigine ] Rash   Tape    Carbamazepine Other (See Comments)    Medication interaction-prograf     Hydrocodone-Acetaminophen  Itching   Naproxen Itching   Penicillins    Vancomycin  Rash    Macular/blotchy rash at infusion site    Past Medical History:  Diagnosis Date   Abnormal uterine bleeding    Allergy    Anxiety    Arthritis    Bipolar disorder (manic depression) (HCC)    Cancer (HCC) Diffuse B-Cell Lymphoma   2015   Chest pain    a. 09/2021 MV: Low risk without ischemia or infarct.  Normal EF.   Chronic  kidney failure    Chronic renal disease, stage III (HCC)    Diabetes mellitus without complication (HCC)    DLBCL (diffuse large B cell lymphoma) (HCC) 2015   Right axillary lymph node resected and chemo tx's.   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 02/2023 Echo: EF 55-60%, no rwma, nl RV size/fxn. Mildly dil LA.   Personality disorder (HCC)    Pineal gland cyst    Post herpetic neuralgia    PTSD (post-traumatic stress disorder)    RBBB    Renal disorder    S/P liver transplant (HCC) 1993   Sleep apnea    has not recieved cpap yet-other one was recalled    Past Surgical History:  Procedure Laterality Date   ARTERY BIOPSY Right 04/14/2024   Procedure: BIOPSY TEMPORAL ARTERY;  Surgeon: Marea Selinda RAMAN, MD;  Location: [facility] ORS;  Service: General;  Laterality: Right;   BONE MARROW BIOPSY  01/13/2015   BREAST BIOPSY Right 12/2014   US  Axilla Bx, Positive for Lymphoma   BREAST BIOPSY Right 2011   benign   BREAST BIOPSY Right 2023   Stereo bx, X-clip, Fat Necrosis   BREAST BIOPSY Right 01/20/2024   US  RT BREAST BX W LOC DEV 1ST LESION IMG BX SPEC US  GUIDE 01/20/2024 [facility]   BREAST SURGERY Right 2016   Lymphoma, lymph node removal. pt then got a seroma and had to do more surgery   CHOLECYSTECTOMY     COLONOSCOPY     COLONOSCOPY WITH PROPOFOL  N/A 02/28/2022   Procedure: COLONOSCOPY WITH PROPOFOL ;  Surgeon: Jinny Carmine, MD;  Location: [facility];  Service: Endoscopy;  Laterality: N/A;   ESOPHAGOGASTRODUODENOSCOPY     ESOPHAGOGASTRODUODENOSCOPY (EGD) WITH PROPOFOL  N/A 01/09/2021   Procedure: ESOPHAGOGASTRODUODENOSCOPY (EGD) WITH PROPOFOL ;  Surgeon: Jinny Carmine, MD;  Location: [facility] ENDOSCOPY;  Service: Endoscopy;  Laterality: N/A;   HERNIA REPAIR     x4-all from liver transplant   HYSTEROSCOPY WITH D & C N/A 11/13/2021   Procedure: DILATATION AND CURETTAGE /HYSTEROSCOPY;  Surgeon: Victor Claudell SAUNDERS, MD;  Location: [facility] ORS;  Service: Gynecology;  Laterality: N/A;   infusaport     LIVER TRANSPLANT  12/17/1991   LUMBAR PUNCTURE      PORT A CATH INJECTION ([facility])     RENAL BIOPSY     tumor removal  2015   cancer right side    Social History:  reports that she has never smoked. She has been exposed to tobacco smoke. She has never used smokeless tobacco. She reports that she does not currently use alcohol. She reports that she does not currently use drugs.  Family History:  Family History  Problem Relation Age of Onset   Heart disease Mother    Hypertension Mother    Cancer - Other Mother    Bipolar disorder Mother    Anxiety disorder Mother    Arthritis Mother    Depression  Mother    Heart disease Father    Hypertension Father    Diabetes Father    Parkinson's disease Maternal Grandmother    COPD Maternal Grandmother    Cancer Maternal Aunt    Cancer Maternal Uncle    Cancer Maternal Grandfather    Lupus Paternal Grandmother    Hypertension Brother    Alcohol abuse Brother    Depression Brother    Alcohol abuse Maternal Uncle      Prior to Admission medications   Medication Sig Start Date End Date Taking? Authorizing Provider  Accu-Chek Softclix Lancets lancets 2 (two) times daily. 06/01/23   [provider]  albuterol  (VENTOLIN  HFA) 108 (90 Base) MCG/ACT inhaler Inhale 2 puffs into the lungs every 6 (six) hours as needed for wheezing or shortness of breath. 09/08/23   Gareth Mliss FALCON, FNP  baclofen  (LIORESAL ) 10 MG tablet Take 1 tablet by mouth 2 (two) times daily. 08/15/21   [provider]  benztropine  (COGENTIN ) 1 MG tablet TAKE 1 TABLET BY MOUTH ONCE DAILY AS NEEDED FOR TREMORS 08/01/23   [name], [name], MD  Continuous Glucose Sensor (FREESTYLE LIBRE 3 PLUS SENSOR) MISC CHANGE SENSOR EVERY 15 DAYS 04/12/24   Dartha Ernst, MD  Cranberry-Vitamin C-Probiotic (AZO CRANBERRY PO) Take by mouth.    [provider]  fluticasone  (FLONASE ) 50 MCG/ACT nasal spray Place into the nose. 10/30/21   [provider]  glipiZIDE  (GLUCOTROL  XL) 10 MG 24 hr tablet TAKE 1 TABLET BY MOUTH  IN THE MORNING AND AT BEDTIME 03/30/24   Motwani, Komal, MD  glucose blood (RELION GLUCOSE TEST STRIPS) test strip Use as instructed 04/19/24   Gareth Mliss FALCON, FNP  insulin  glargine (LANTUS  SOLOSTAR) 100 UNIT/ML Solostar Pen Inject 40 Units into the skin 2 (two) times daily. 04/16/24   Fausto Burnard LABOR, DO  lisinopril  (ZESTRIL ) 20 MG tablet TAKE 1 TABLET BY MOUTH ONCE DAILY *PATIENT WILL NEED TO SCHEDULE AN APPOINTMENT FOR ADDITIONAL REFILLS* Patient taking differently: Patient states restarted because BP was elevated, restarted about 1 week ago 02/27/24   Gareth Mliss FALCON, FNP  lurasidone  (LATUDA ) 20 MG TABS tablet Take 1 tablet (20 mg total) by mouth daily with supper. 06/04/24 12/01/24  [name], [name], MD  magnesium  oxide (MAG-OX) 400 MG tablet Take 1,000 mg by mouth 2 (two) times daily.    [provider]  Melatonin 10 MG TABS Take 10 mg by mouth at bedtime.    [provider]  metFORMIN  (GLUCOPHAGE ) 1000 MG tablet TAKE 1 TABLET BY MOUTH TWICE DAILY WITH MEALS *PATIENT WILL NEED TO SCHEDULE APPOINTMENT FOR ADDITIONAL REFILLS* 02/27/24   Gareth Mliss FALCON, FNP  mirabegron  ER (MYRBETRIQ ) 50 MG TB24 tablet Take 1 tablet (50 mg total) by mouth daily. 01/22/23   Gareth Mliss FALCON, FNP  mirtazapine  (REMERON ) 15 MG tablet TAKE 1 TABLET BY MOUTH AT BEDTIME 05/03/24   [name], [name], MD  naloxone  (NARCAN ) nasal spray 4 mg/0.1 mL Place 0.4 mg into the nose once. 09/05/21   [provider]  NOVOLOG  FLEXPEN 100 UNIT/ML FlexPen Inject 40 Units into the skin 3 (three) times daily with meals. Hold if not eating at least half of a full meal. 04/16/24   Fausto Burnard A, DO  ondansetron  (ZOFRAN ) 4 MG tablet Take 1 tablet (4 mg total) by mouth every 6 (six) hours as needed for nausea. 10/16/23   Caleen Qualia, MD  oxyCODONE  (OXY IR/ROXICODONE ) 5 MG immediate release tablet Take 5 mg by mouth 3 (three) times  daily as needed.    [provider]  pantoprazole  (PROTONIX ) 40 MG tablet Take 1  tablet (40 mg total) by mouth daily. Recommend using while on Prednisone . 04/17/24   Fausto Burnard LABOR, DO  PRECISION QID TEST test strip  08/06/19   [provider]  predniSONE  (DELTASONE ) 50 MG tablet Take one tablet (50 mg) by mouth once daily until follow up with Neurology. Patient taking differently: 40 mg. Currently taking 30 mg qd 04/17/24   Fausto Burnard A, DO  pregabalin  (LYRICA ) 200 MG capsule Take 1 capsule (200 mg total) by mouth 2 (two) times daily. 12/03/21   Floy Roberts, MD  rosuvastatin  (CRESTOR ) 10 MG tablet TAKE 1 TABLET BY MOUTH ONCE DAILY 05/31/24   Haley, Sheng L, PA-C  Semaglutide , 2 MG/DOSE, 8 MG/3ML SOPN Inject 2 mg as directed once a week. 04/21/24   Motwani, Komal, MD  SURE COMFORT PEN NEEDLES 32G X 6 MM MISC USE AS DIRECTED 05/03/24   Pender, Julie F, FNP  tacrolimus  (PROGRAF ) 1 MG capsule Take 1 capsule by mouth every morning. 02/20/23   [provider]  TYLENOL  PM EXTRA STRENGTH 1000-50 MG/30ML LIQD Take by mouth.    [provider]  UBRELVY  50 MG TABS Take 1 tablet by mouth daily as needed. 01/19/22   [provider]  venlafaxine  XR (EFFEXOR -XR) 37.5 MG 24 hr capsule Take 1 capsule (37.5 mg total) by mouth every morning. Take along with 75 mg daily 05/18/24   [name], [name], MD  venlafaxine  XR (EFFEXOR -XR) 75 MG 24 hr capsule Take 1 capsule (75 mg total) by mouth daily with breakfast. Take along with 37.5 mg daily 09/09/23   [name], [name], MD    Physical Exam: Vitals:   06/04/24 1807 06/04/24 1900 06/04/24 1930 06/04/24 2117  BP: 96/67 111/65 117/72 124/71  Pulse: 88 75 79 81  Resp: 18  18 17   Temp: 98.7 F (37.1 C)   98.6 F (37 C)  TempSrc: Oral     SpO2: 99% 98% 98% 94%  Weight:      Height:       General: Not in acute distress HEENT:       Eyes: PERRL, EOMI, no jaundice       ENT: No discharge from the ears and nose, no pharynx injection, no tonsillar enlargement.        Neck: No JVD, no bruit, no mass felt. Heme:  No neck lymph node enlargement. Cardiac: S1/S2, RRR, No gallops or rubs. Respiratory: No rales, wheezing, rhonchi or rubs. GI: Soft, nondistended, nontender, no rebound pain, no organomegaly, BS present. GU: No hematuria Ext: No pitting leg edema bilaterally. 1+DP/PT pulse bilaterally. Musculoskeletal: has minimal left ankle tenderness Skin: No rashes.  Neuro: Alert, oriented X3, cranial nerves II-XII grossly intact, moves all extremities. Psych: Patient is not psychotic, no suicidal or hemocidal ideation.  Labs on Admission: I have personally reviewed following labs and imaging studies  CBC: Recent Labs  Lab 06/04/24 1511  WBC 13.7*  HGB 14.6  HCT 43.7  MCV 85.7  PLT 180   Basic Metabolic Panel: Recent Labs  Lab 06/04/24 1511  NA 134*  K 4.9  CL 96*  CO2 25  GLUCOSE 301*  BUN 23*  CREATININE 0.99  CALCIUM  9.3   GFR: Estimated Creatinine Clearance: 85.8 mL/min (by C-G formula based on SCr of 0.99 mg/dL). Liver Function Tests: Recent Labs  Lab 06/04/24 1511  AST 33  ALT 35  ALKPHOS 62  BILITOT 1.0  PROT 6.9  ALBUMIN 3.6   No results for input(s): LIPASE, AMYLASE in the last 168 hours. No results for input(s): AMMONIA in the last 168 hours. Coagulation Profile: Recent Labs  Lab 06/04/24 2142  INR 1.0   Cardiac Enzymes: No results for input(s): CKTOTAL, CKMB, CKMBINDEX, TROPONINI in the last 168 hours. BNP (last 3 results) No results for input(s): PROBNP in the last 8760 hours. HbA1C: No results for input(s): HGBA1C in the last 72 hours. CBG: Recent Labs  Lab 06/04/24 2138  GLUCAP 281*   Lipid Profile: Recent Labs    06/04/24 1917  CHOL 123  HDL 44  LDLCALC 49  TRIG 150*  CHOLHDL 2.8   Thyroid  Function Tests: No results for input(s): TSH, T4TOTAL, FREET4, T3FREE, THYROIDAB in the last 72 hours. Anemia Panel: No results for input(s): VITAMINB12, FOLATE, FERRITIN, TIBC, IRON, RETICCTPCT in the last 72  hours. Urine analysis:    Component Value Date/Time   COLORURINE YELLOW (A) 06/04/2024 1609   APPEARANCEUR CLEAR (A) 06/04/2024 1609   APPEARANCEUR Clear 02/12/2022 1125   LABSPEC 1.011 06/04/2024 1609   PHURINE 6.0 06/04/2024 1609   GLUCOSEU >=500 (A) 06/04/2024 1609   HGBUR NEGATIVE 06/04/2024 1609   BILIRUBINUR NEGATIVE 06/04/2024 1609   BILIRUBINUR neg 02/25/2024 1440   BILIRUBINUR Negative 02/12/2022 1125   KETONESUR NEGATIVE 06/04/2024 1609   PROTEINUR >=300 (A) 06/04/2024 1609   UROBILINOGEN 0.2 02/25/2024 1440   NITRITE NEGATIVE 06/04/2024 1609   LEUKOCYTESUR TRACE (A) 06/04/2024 1609   Sepsis Labs: @LABRCNTIP (procalcitonin:4,lacticidven:4) ) Recent Results (from the past 240 hours)  Resp panel by RT-PCR (RSV, Flu A&B, Covid) Anterior Nasal Swab     Status: None   Collection Time: 06/04/24  4:09 PM   Specimen: Anterior Nasal Swab  Result Value Ref Range Status   SARS Coronavirus 2 by RT PCR NEGATIVE NEGATIVE Final    Comment: (NOTE) SARS-CoV-2 target nucleic acids are NOT DETECTED.  The SARS-CoV-2 RNA is generally detectable in upper respiratory specimens during the acute phase of infection. The lowest concentration of SARS-CoV-2 viral copies this assay can detect is 138 copies/mL. A negative result does not preclude SARS-Cov-2 infection and should not be used as the sole basis for treatment or other patient management decisions. A negative result may occur with  improper specimen collection/handling, submission of specimen other than nasopharyngeal swab, presence of viral mutation(s) within the areas targeted by this assay, and inadequate number of viral copies(<138 copies/mL). A negative result must be combined with clinical observations, patient history, and epidemiological information. The expected result is Negative.  Fact Sheet for Patients:  BloggerCourse.com  Fact Sheet for Healthcare Providers:   SeriousBroker.it  This test is no t yet approved or cleared by the [place]  FDA and  has been authorized for detection and/or diagnosis of SARS-CoV-2 by FDA under an Emergency Use Authorization (EUA). This EUA will remain  in effect (meaning this test can be used) for the duration of the COVID-19 declaration under Section 564(b)(1) of the Act, 21 U.S.C.section 360bbb-3(b)(1), unless the authorization is terminated  or revoked sooner.       Influenza A by PCR NEGATIVE NEGATIVE Final   Influenza B by PCR NEGATIVE NEGATIVE Final    Comment: (NOTE) The Xpert Xpress SARS-CoV-2/FLU/RSV plus assay is intended as an aid in the diagnosis of influenza from Nasopharyngeal swab specimens and should not be used as a sole basis for treatment. Nasal washings and aspirates are unacceptable for Xpert Xpress SARS-CoV-2/FLU/RSV testing.  Fact Sheet  for Patients: BloggerCourse.com  Fact Sheet for Healthcare Providers: SeriousBroker.it  This test is not yet approved or cleared by the [place]  FDA and has been authorized for detection and/or diagnosis of SARS-CoV-2 by FDA under an Emergency Use Authorization (EUA). This EUA will remain in effect (meaning this test can be used) for the duration of the COVID-19 declaration under Section 564(b)(1) of the Act, 21 U.S.C. section 360bbb-3(b)(1), unless the authorization is terminated or revoked.     Resp Syncytial Virus by PCR NEGATIVE NEGATIVE Final    Comment: (NOTE) Fact Sheet for Patients: BloggerCourse.com  Fact Sheet for Healthcare Providers: SeriousBroker.it  This test is not yet approved or cleared by the [place]  FDA and has been authorized for detection and/or diagnosis of SARS-CoV-2 by FDA under an Emergency Use Authorization (EUA). This EUA will remain in effect (meaning this test can be used) for  the duration of the COVID-19 declaration under Section 564(b)(1) of the Act, 21 U.S.C. section 360bbb-3(b)(1), unless the authorization is terminated or revoked.  Performed at [facility], [address], [place], [place] [address]      Radiological Exams on Admission:   Assessment/Plan Principal Problem:   SIRS (systemic inflammatory response syndrome) (HCC) Active Problems:   Hypertension   HLD (hyperlipidemia)   Type II diabetes mellitus with renal manifestations (HCC)   CKD (chronic kidney disease) stage 2, GFR 60-89 ml/min   Marginal zone B-cell lymphoma (HCC)   Left ankle pain   S/P liver transplant (HCC)   Bipolar disorder (HCC)   Depression with anxiety   Systemic lupus erythematosus (SLE) in adult [facility])   Chronic pain   Obesity (BMI 30-39.9)   Hyperlipidemia   Assessment and Plan:  SIRS (systemic inflammatory response syndrome) [facility]): Patient meets criteria for SIRS with WBC 13.7 and heart rate up to 105.  Patient reportedly had fever for 101.6 in clinic, currently temperature is 99.9.  Lactic acid is elevated 2.3.  No clear source of infection is identified.  Chest x-ray negative.  Negative PCR for COVID, flu and RSV.  Urinalysis not impressive.  Will treat patient as SIRS now. If source of infection is identified later, will  need to change to sepsis.  Since patient is immunosuppressed, will start broad-spectrum antibiotics.  Patient is allergic to vancomycin .  -Admit to telemetry bed as inpatient - IV Linezolid  --> need to observe closely for any signs of serotonin syndrome (patient is on multiple serotonergic medications for psych issue) - IV cefepime  - Blood culture - trend lactic acid level - IVF: 2.5 L LR, then 75 cc/h  Hypertension: Patient is not taking lisinopril  currently.  Blood pressure 117/72. -IV hydralazine  as needed  HLD (hyperlipidemia) -Crestor   Type II diabetes mellitus with renal manifestations [facility]): Recent A1c 9.7, poorly  controlled.  Patient taking semaglutide , glipizide , NovoLog , Lantus  40 units daily - SSI - Glargine insulin  35 units daily  CKD (chronic kidney disease) stage 2, GFR 60-89 ml/min: Renal function stable, GFR> 60 today - Follow-up with BMP  Marginal zone B-cell lymphoma (HCC) -Following up with heme-onc  Left ankle pain: X-ray showed possible tiny avulsion injury adjacent to the medial malleolus.  Her left ankle pain is very minimal, no erythema or any signs of infection.  - Continue home as needed oxycodone  - As needed Tylenol   S/P liver transplant (HCC) -Continue tacrolimus  - Patient is on prednisone  tapering, currently dose is 30 mg daily - Give 100 mg of Solu-Cortef  for stress dose  Bipolar disorder and  depression with anxiety: -continue Latuda , Remeron , Effexor  - As needed benztropine   Systemic lupus erythematosus (SLE) in adult [facility]) -Patient is on immunosuppressant and prednisone  as above  Chronic pain -Continue home as needed oxycodone , Lyrica   Obesity (BMI 30-39.9): Patient has Obesity Class II, with body weight 110.7 Kg and BMI 39.38 kg/m2.  - Encourage losing weight - Exercise and healthy diet  Hyperlipidemia - Crestor           DVT ppx: SQ Lovenox   Code Status: Full code    Family Communication:     not done, no family member is at bed side.   Disposition Plan:  Anticipate discharge back to previous environment  Consults called:  none  Admission status and Level of care: Telemetry Medical:  as inpt        Dispo: The patient is from: Home              Anticipated d/c is to: Home              Anticipated d/c date is: 2 days              Patient currently is not medically stable to d/c.    Severity of Illness:  The appropriate patient status for this patient is INPATIENT. Inpatient status is judged to be reasonable and necessary in order to provide the required intensity of service to ensure the patient's safety. The patient's presenting  symptoms, physical exam findings, and initial radiographic and laboratory data in the context of their chronic comorbidities is felt to place them at high risk for further clinical deterioration. Furthermore, it is not anticipated that the patient will be medically stable for discharge from the hospital within 2 midnights of admission.   * I certify that at the point of admission it is my clinical judgment that the patient will require inpatient hospital care spanning beyond 2 midnights from the point of admission due to high intensity of service, high risk for further deterioration and high frequency of surveillance required.*       Date of Service 06/04/2024    Caleb Exon [facility]   If 7PM-7AM, please contact night-coverage [email or web address] 06/04/2024, 11:24 PM

## 2024-06-04 NOTE — ED Triage Notes (Addendum)
 Pt reffered to the ED due to a fever that they found. Pt went to them due to left ankle pain and they found that pt had a fever of 101.6. Pt had a liver transplant 33 years ago. Pt sts takes her anti rejection meds. Pt sts that she has no s/s of having a fever and feels like.

## 2024-06-04 NOTE — ED Notes (Signed)
 This RN attempted to access PORT but was unsuccessful. IV team consult placed.

## 2024-06-04 NOTE — ED Notes (Signed)
 Per pt she had labs collected at [facility] rheumatology yesterday and they called her and told her she might have an infection and to go to the ER. Fall happened 3 weeks ago and is still causing some swelling and discomfort today.

## 2024-06-05 DIAGNOSIS — N182 Chronic kidney disease, stage 2 (mild): Secondary | ICD-10-CM | POA: Diagnosis not present

## 2024-06-05 DIAGNOSIS — R509 Fever, unspecified: Secondary | ICD-10-CM | POA: Diagnosis present

## 2024-06-05 DIAGNOSIS — I129 Hypertensive chronic kidney disease with stage 1 through stage 4 chronic kidney disease, or unspecified chronic kidney disease: Principal | ICD-10-CM

## 2024-06-05 DIAGNOSIS — R6510 Systemic inflammatory response syndrome (SIRS) of non-infectious origin without acute organ dysfunction: Secondary | ICD-10-CM

## 2024-06-05 DIAGNOSIS — E1122 Type 2 diabetes mellitus with diabetic chronic kidney disease: Secondary | ICD-10-CM

## 2024-06-05 DIAGNOSIS — M329 Systemic lupus erythematosus, unspecified: Secondary | ICD-10-CM | POA: Diagnosis not present

## 2024-06-05 LAB — BASIC METABOLIC PANEL WITH GFR
Anion gap: 13 (ref 5–15)
BUN: 17 mg/dL (ref 6–20)
CO2: 24 mmol/L (ref 22–32)
Calcium: 8.2 mg/dL — ABNORMAL LOW (ref 8.9–10.3)
Chloride: 98 mmol/L (ref 98–111)
Creatinine, Ser: 0.82 mg/dL (ref 0.44–1.00)
GFR, Estimated: >60 mL/min (ref >60)
Glucose, Bld: 273 mg/dL — ABNORMAL HIGH (ref 70–99)
Potassium: 4.2 mmol/L (ref 3.5–5.1)
Sodium: 135 mmol/L (ref 135–145)

## 2024-06-05 LAB — RESPIRATORY PANEL BY PCR

## 2024-06-05 LAB — C-REACTIVE PROTEIN: CRP: 0.9 mg/dL (ref <1.0)

## 2024-06-05 LAB — CBC
HCT: 35.5 % — ABNORMAL LOW (ref 36.0–46.0)
Hemoglobin: 12 g/dL (ref 12.0–15.0)
MCH: 29 pg (ref 26.0–34.0)
MCHC: 33.8 g/dL (ref 30.0–36.0)
MCV: 85.7 fL (ref 80.0–100.0)
Platelets: 128 K/uL — ABNORMAL LOW (ref 150–400)
RBC: 4.14 MIL/uL (ref 3.87–5.11)
RDW: 14.5 % (ref 11.5–15.5)
WBC: 7.3 K/uL (ref 4.0–10.5)
nRBC: 0 % (ref 0.0–0.2)

## 2024-06-05 LAB — GLUCOSE, CAPILLARY
Glucose-Capillary: 250 mg/dL — ABNORMAL HIGH (ref 70–99)
Glucose-Capillary: 377 mg/dL — ABNORMAL HIGH (ref 70–99)

## 2024-06-05 LAB — LACTIC ACID, PLASMA: Lactic Acid, Venous: 2.6 mmol/L (ref 0.5–1.9)

## 2024-06-05 LAB — PREGNANCY, URINE: Preg Test, Ur: NEGATIVE

## 2024-06-05 MED ORDER — HEPARIN SOD (PORK) LOCK FLUSH 100 UNIT/ML IV SOLN
500.0000 [IU] | Freq: Once | INTRAVENOUS | Status: DC
  Filled 2024-06-05: qty 5

## 2024-06-05 NOTE — Plan of Care (Signed)
 Pt transfer from ED, oriented to room; vital stable, no fever and no c/o pain. Problem: Clinical Measurements: Goal: Ability to maintain clinical measurements within normal limits will improve Outcome: Progressing   Problem: Activity: Goal: Risk for activity intolerance will decrease Outcome: Progressing   Problem: Coping: Goal: Level of anxiety will decrease Outcome: Progressing   Problem: Pain Managment: Goal: General experience of comfort will improve and/or be controlled Outcome: Progressing   Problem: Safety: Goal: Ability to remain free from injury will improve Outcome: Progressing

## 2024-06-05 NOTE — Discharge Summary (Signed)
 Physician Discharge Summary   Alison [name]  female DOB: Jan 14, 1976  FMW:[id number]  PCP: Gareth Mliss FALCON, FNP  Admit date: 06/04/2024 Discharge date: 06/05/2024  Admitted From: home Disposition:  home CODE STATUS: Full code   Hospital Course:  For full details, please see H&P, progress notes, consult notes and ancillary notes.  Briefly,  Alison [name] is a 48 y.o. female with medical history significant of lupus, s/p liver transplant on tacrolimus  and prednisone , HTN, DM, chronic pain syndrome, obesity, lymphoma bilateral parotid gland s/p Rituxan  in 2020, diffuse large B-cell lymphoma, OSA not using CPAP, presented with fever and left ankle pain.    Pt state that she she had fall 3 weeks ago, injured her left ankle.  She developed left ankle pain and swelling initially.  Her left ankle swelling has resolved, but still has some pain. No erythema or warmth.   She went to the walk-in [facility] to have ankle evaluated. She was found to have fever of 101.6 and was sent to the ED for further evaluation.  Her temperature is 99.9 in ED. patient denies subjective fever or chills.  ROS neg.  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  Had negative biopsy for temporal arteritis.  Patient was treated with high-dose steroid.   SIRS (systemic inflammatory response syndrome) (HCC):  WBC 13.7 and heart rate up to 105.  Patient reportedly had fever for 101.6 in clinic, currently temperature is 99.9.  Lactic acid is elevated 2.3.  No clear source of infection is identified.  Chest x-ray negative.  Negative PCR for COVID, flu and RSV.  Urinalysis unremarkable.   --due to the fact that patient is immunosuppressed, pt was started on empiric broad-spectrum antibiotics Linezolid  and cefepime .  Pt continued to be asymptomatic of any infectious etiology, so abx d/c'ed and pt was discharged.  Left ankle pain:  X-ray showed possible tiny avulsion  injury adjacent to the medial malleolus.  Her left ankle pain is very minimal, no erythema or any signs of infection.    Hypertension:  --cont home Lisinopril    HLD (hyperlipidemia) -Crestor    Type II diabetes mellitus with renal manifestations [facility]):  Recent A1c 9.7, poorly controlled.   --discharged back on home regimen.   CKD (chronic kidney disease) stage 2, GFR 60-89 ml/min:  Renal function stable  Marginal zone B-cell lymphoma (HCC) -Following up with heme-onc   S/P liver transplant (HCC) -Continue tacrolimus   Bipolar disorder and depression with anxiety: -continue Latuda , Remeron , Effexor    Systemic lupus erythematosus (SLE) in adult [facility])   Chronic pain -Continue home as needed oxycodone , Lyrica    Obesity (BMI 30-39.9): Patient has Obesity Class II, with body weight 110.7 Kg and BMI 39.38 kg/m2.  - Encourage losing weight - Exercise and healthy diet    Discharge Diagnoses:  Principal Problem:   SIRS (systemic inflammatory response syndrome) (HCC) Active Problems:   Hypertension   HLD (hyperlipidemia)   Type II diabetes mellitus with renal manifestations (HCC)   CKD (chronic kidney disease) stage 2, GFR 60-89 ml/min   Marginal zone B-cell lymphoma (HCC)   Left ankle pain   S/P liver transplant (HCC)   Bipolar disorder (HCC)   Depression with anxiety   Systemic lupus erythematosus (SLE) in adult [facility])   Chronic pain   Obesity (BMI 30-39.9)   Hyperlipidemia   Fever, unknown origin   30 Day Unplanned Readmission Risk Score    Flowsheet Row ED  to Hosp-Admission (Current) from 06/04/2024 in [facility] 1C MEDICAL TELEMETRY  30 Day Unplanned Readmission Risk Score (%) 41.95 Filed at 06/05/2024 1200    This score is the patient's risk of an unplanned readmission within 30 days of being discharged (0 -100%). The score is based on dignosis, age, lab data, medications, orders, and past utilization.   Low:  0-14.9   Medium: 15-21.9   High:  22-29.9   Extreme: 30 and above         Discharge Instructions:  Allergies as of 06/05/2024       Reactions   Penicillin G Itching, Rash, Anaphylaxis, Palpitations   Lamictal  [lamotrigine ] Rash   Tape    Carbamazepine Other (See Comments)   Medication interaction-prograf    Hydrocodone-acetaminophen  Itching   Naproxen Itching   Penicillins    Vancomycin  Rash   Macular/blotchy rash at infusion site        Medication List     TAKE these medications    Accu-Chek Softclix Lancets lancets 2 (two) times daily.   albuterol  108 (90 [name]) MCG/ACT inhaler Commonly known as: VENTOLIN  HFA Inhale 2 puffs into the lungs every 6 (six) hours as needed for wheezing or shortness of breath.   AZO CRANBERRY PO Take by mouth.   baclofen  10 MG tablet Commonly known as: LIORESAL  Take 1 tablet by mouth 2 (two) times daily.   benztropine  1 MG tablet Commonly known as: COGENTIN  TAKE 1 TABLET BY MOUTH ONCE DAILY AS NEEDED FOR TREMORS   fluticasone  50 MCG/ACT nasal spray Commonly known as: FLONASE  Place into the nose.   FreeStyle Libre 3 Plus Sensor Misc CHANGE SENSOR EVERY 15 DAYS   glipiZIDE  10 MG 24 hr tablet Commonly known as: GLUCOTROL  XL TAKE 1 TABLET BY MOUTH IN THE MORNING AND AT BEDTIME   Lantus  SoloStar 100 UNIT/ML Solostar Pen Generic drug: insulin  glargine Inject 40 Units into the skin 2 (two) times daily. What changed: when to take this   lisinopril  20 MG tablet Commonly known as: ZESTRIL  TAKE 1 TABLET BY MOUTH ONCE DAILY *PATIENT WILL NEED TO SCHEDULE AN APPOINTMENT FOR ADDITIONAL REFILLS*   lurasidone  20 MG Tabs tablet Commonly known as: LATUDA  Take 1 tablet (20 mg total) by mouth daily with supper.   magnesium  oxide 400 MG tablet Commonly known as: MAG-OX Take 1,000 mg by mouth 2 (two) times daily.   Melatonin 10 MG Tabs Take 10 mg by mouth at bedtime.   metFORMIN  1000 MG tablet Commonly known as: GLUCOPHAGE  TAKE 1 TABLET BY MOUTH TWICE DAILY WITH  MEALS *PATIENT WILL NEED TO SCHEDULE APPOINTMENT FOR ADDITIONAL REFILLS*   mirabegron  ER 50 MG Tb24 tablet Commonly known as: Myrbetriq  Take 1 tablet (50 mg total) by mouth daily.   mirtazapine  15 MG tablet Commonly known as: REMERON  TAKE 1 TABLET BY MOUTH AT BEDTIME   naloxone  4 MG/0.1ML Liqd nasal spray kit Commonly known as: NARCAN  Place 0.4 mg into the nose once.   NovoLOG  FlexPen 100 UNIT/ML FlexPen Generic drug: insulin  aspart Inject 40 Units into the skin 3 (three) times daily with meals. Hold if not eating at least half of a full meal. What changed:  how much to take additional instructions   ondansetron  4 MG tablet Commonly known as: ZOFRAN  Take 1 tablet (4 mg total) by mouth every 6 (six) hours as needed for nausea.   oxyCODONE  5 MG immediate release tablet Commonly known as: Oxy IR/ROXICODONE  Take 5 mg by mouth 3 (three) times daily as  needed.   pantoprazole  40 MG tablet Commonly known as: PROTONIX  Take 1 tablet (40 mg total) by mouth daily. Recommend using while on Prednisone .   Precision QID Test test strip Generic drug: glucose blood   ReliOn Glucose Test Strips test strip Generic drug: glucose blood Use as instructed   predniSONE  20 MG tablet Commonly known as: DELTASONE  Take 20 mg by mouth daily with breakfast. Take with 10 mg tablet to equal 30 mg by mouth daily   predniSONE  10 MG tablet Commonly known as: DELTASONE  Take 10 mg by mouth daily with breakfast. Take with 10 mg tablet to equal 30 mg by mouth daily   pregabalin  200 MG capsule Commonly known as: LYRICA  Take 1 capsule (200 mg total) by mouth 2 (two) times daily.   rosuvastatin  10 MG tablet Commonly known as: CRESTOR  TAKE 1 TABLET BY MOUTH ONCE DAILY   Semaglutide  (2 MG/DOSE) 8 MG/3ML Sopn Inject 2 mg as directed once a week.   Sure Comfort Pen Needles 32G X 6 MM Misc Generic drug: Insulin  Pen Needle USE AS DIRECTED   tacrolimus  1 MG capsule Commonly known as: PROGRAF  Take 1  capsule by mouth every morning.   Ubrelvy  50 MG Tabs Generic drug: Ubrogepant  Take 1 tablet by mouth daily as needed.   venlafaxine  XR 75 MG 24 hr capsule Commonly known as: EFFEXOR -XR Take 1 capsule (75 mg total) by mouth daily with breakfast. Take along with 37.5 mg daily   venlafaxine  XR 37.5 MG 24 hr capsule Commonly known as: EFFEXOR -XR Take 1 capsule (37.5 mg total) by mouth every morning. Take along with 75 mg daily         Follow-up Information     Gareth Mliss FALCON, FNP Follow up in 1 week(s).   Specialty: Nurse Practitioner Contact information: [address] [place] [address] [phone]                 Allergies  Allergen Reactions   Penicillin G Itching, Rash, Anaphylaxis and Palpitations   Lamictal  [Lamotrigine ] Rash   Tape    Carbamazepine Other (See Comments)    Medication interaction-prograf     Hydrocodone-Acetaminophen  Itching   Naproxen Itching   Penicillins    Vancomycin  Rash    Macular/blotchy rash at infusion site     The results of significant diagnostics from this hospitalization (including imaging, microbiology, ancillary and laboratory) are listed below for reference.   Consultations:   Procedures/Studies: DG Chest 2 View Result Date: 06/04/2024 CLINICAL DATA:  Fever of unknown origin EXAM: CHEST - 2 VIEW COMPARISON:  Chest x-ray and CT 03/29/2024 FINDINGS: Left-sided central venous port tip over the SVC. No acute airspace disease or effusion. Stable cardiomediastinal silhouette. No pneumothorax. IMPRESSION: No active cardiopulmonary disease. Electronically Signed   By: Luke Bun M.D.   On: 06/04/2024 16:13   DG Ankle Complete Left Result Date: 06/04/2024 CLINICAL DATA:  Left ankle pain after fall EXAM: LEFT ANKLE COMPLETE - 3+ VIEW COMPARISON:  None Available. FINDINGS: No malalignment. Moderate plantar calcaneal spur and small posterior enthesophyte. Possible tiny avulsion injury adjacent to the medial  malleolus. Mortise is symmetric IMPRESSION: Possible tiny avulsion injury adjacent to the medial malleolus. Electronically Signed   By: Luke Bun M.D.   On: 06/04/2024 16:12      Labs: BNP (last 3 results) No results for input(s): BNP in the last 8760 hours. Basic Metabolic Panel: Recent Labs  Lab 06/04/24 1511 06/05/24 0550  NA 134* 135  K 4.9  4.2  CL 96* 98  CO2 25 24  GLUCOSE 301* 273*  BUN 23* 17  CREATININE 0.99 0.82  CALCIUM  9.3 8.2*   Liver Function Tests: Recent Labs  Lab 06/04/24 1511  AST 33  ALT 35  ALKPHOS 62  BILITOT 1.0  PROT 6.9  ALBUMIN 3.6   No results for input(s): LIPASE, AMYLASE in the last 168 hours. No results for input(s): AMMONIA in the last 168 hours. CBC: Recent Labs  Lab 06/04/24 1511 06/05/24 0550  WBC 13.7* 7.3  HGB 14.6 12.0  HCT 43.7 35.5*  MCV 85.7 85.7  PLT 180 128*   Cardiac Enzymes: No results for input(s): CKTOTAL, CKMB, CKMBINDEX, TROPONINI in the last 168 hours. BNP: Invalid input(s): POCBNP CBG: Recent Labs  Lab 06/04/24 2138 06/05/24 0812 06/05/24 1208  GLUCAP 281* 250* 377*   D-Dimer No results for input(s): DDIMER in the last 72 hours. Hgb A1c No results for input(s): HGBA1C in the last 72 hours. Lipid Profile Recent Labs    06/04/24 1917  CHOL 123  HDL 44  LDLCALC 49  TRIG 150*  CHOLHDL 2.8   Thyroid  function studies No results for input(s): TSH, T4TOTAL, T3FREE, THYROIDAB in the last 72 hours.  Invalid input(s): FREET3 Anemia work up No results for input(s): VITAMINB12, FOLATE, FERRITIN, TIBC, IRON, RETICCTPCT in the last 72 hours. Urinalysis    Component Value Date/Time   COLORURINE YELLOW (A) 06/04/2024 1609   APPEARANCEUR CLEAR (A) 06/04/2024 1609   APPEARANCEUR Clear 02/12/2022 1125   LABSPEC 1.011 06/04/2024 1609   PHURINE 6.0 06/04/2024 1609   GLUCOSEU >=500 (A) 06/04/2024 1609   HGBUR NEGATIVE 06/04/2024 1609   BILIRUBINUR NEGATIVE  06/04/2024 1609   BILIRUBINUR neg 02/25/2024 1440   BILIRUBINUR Negative 02/12/2022 1125   KETONESUR NEGATIVE 06/04/2024 1609   PROTEINUR >=300 (A) 06/04/2024 1609   UROBILINOGEN 0.2 02/25/2024 1440   NITRITE NEGATIVE 06/04/2024 1609   LEUKOCYTESUR TRACE (A) 06/04/2024 1609   Sepsis Labs Recent Labs  Lab 06/04/24 1511 06/05/24 0550  WBC 13.7* 7.3   Microbiology Recent Results (from the past 240 hours)  Resp panel by RT-PCR (RSV, Flu A&B, Covid) Anterior Nasal Swab     Status: None   Collection Time: 06/04/24  4:09 PM   Specimen: Anterior Nasal Swab  Result Value Ref Range Status   SARS Coronavirus 2 by RT PCR NEGATIVE NEGATIVE Final    Comment: (NOTE) SARS-CoV-2 target nucleic acids are NOT DETECTED.  The SARS-CoV-2 RNA is generally detectable in upper respiratory specimens during the acute phase of infection. The lowest concentration of SARS-CoV-2 viral copies this assay can detect is 138 copies/mL. A negative result does not preclude SARS-Cov-2 infection and should not be used as the sole basis for treatment or other patient management decisions. A negative result may occur with  improper specimen collection/handling, submission of specimen other than nasopharyngeal swab, presence of viral mutation(s) within the areas targeted by this assay, and inadequate number of viral copies(<138 copies/mL). A negative result must be combined with clinical observations, patient history, and epidemiological information. The expected result is Negative.  Fact Sheet for Patients:  BloggerCourse.com  Fact Sheet for Healthcare Providers:  SeriousBroker.it  This test is no t yet approved or cleared by the [place]  FDA and  has been authorized for detection and/or diagnosis of SARS-CoV-2 by FDA under an Emergency Use Authorization (EUA). This EUA will remain  in effect (meaning this test can be used) for the duration of the COVID-19  declaration under Section 564(b)(1) of the Act, 21 U.S.C.section 360bbb-3(b)(1), unless the authorization is terminated  or revoked sooner.       Influenza A by PCR NEGATIVE NEGATIVE Final   Influenza B by PCR NEGATIVE NEGATIVE Final    Comment: (NOTE) The Xpert Xpress SARS-CoV-2/FLU/RSV plus assay is intended as an aid in the diagnosis of influenza from Nasopharyngeal swab specimens and should not be used as a sole basis for treatment. Nasal washings and aspirates are unacceptable for Xpert Xpress SARS-CoV-2/FLU/RSV testing.  Fact Sheet for Patients: BloggerCourse.com  Fact Sheet for Healthcare Providers: SeriousBroker.it  This test is not yet approved or cleared by the [place]  FDA and has been authorized for detection and/or diagnosis of SARS-CoV-2 by FDA under an Emergency Use Authorization (EUA). This EUA will remain in effect (meaning this test can be used) for the duration of the COVID-19 declaration under Section 564(b)(1) of the Act, 21 U.S.C. section 360bbb-3(b)(1), unless the authorization is terminated or revoked.     Resp Syncytial Virus by PCR NEGATIVE NEGATIVE Final    Comment: (NOTE) Fact Sheet for Patients: BloggerCourse.com  Fact Sheet for Healthcare Providers: SeriousBroker.it  This test is not yet approved or cleared by the [place]  FDA and has been authorized for detection and/or diagnosis of SARS-CoV-2 by FDA under an Emergency Use Authorization (EUA). This EUA will remain in effect (meaning this test can be used) for the duration of the COVID-19 declaration under Section 564(b)(1) of the Act, 21 U.S.C. section 360bbb-3(b)(1), unless the authorization is terminated or revoked.  Performed at [facility], [address] [place], [place] [address]   Culture, blood (routine x 2)     Status: None (Preliminary result)   Collection  Time: 06/04/24  7:18 PM   Specimen: BLOOD  Result Value Ref Range Status   Specimen Description BLOOD BLOOD LEFT ARM  Final   Special Requests   Final    BOTTLES DRAWN AEROBIC AND ANAEROBIC Blood Culture adequate volume   Culture   Final    NO GROWTH < 12 HOURS Performed at [facility], [address] [place], [place] [address]    Report Status PENDING  Incomplete  Culture, blood (routine x 2)     Status: None (Preliminary result)   Collection Time: 06/04/24  7:26 PM   Specimen: BLOOD  Result Value Ref Range Status   Specimen Description BLOOD BLOOD RIGHT ARM  Final   Special Requests   Final    BOTTLES DRAWN AEROBIC AND ANAEROBIC Blood Culture adequate volume   Culture   Final    NO GROWTH < 12 HOURS Performed at [facility], [address] [place], [place] [address]    Report Status PENDING  Incomplete     Total time spend on discharging this patient, including the last patient exam, discussing the hospital stay, instructions for ongoing care as it relates to all pertinent caregivers, as well as preparing the medical discharge records, prescriptions, and/or referrals as applicable, is 40 minutes.    Ellouise Haber, MD  [facility] 06/05/2024, 2:39 PM

## 2024-06-06 LAB — HEMOGLOBIN A1C
Hgb A1c MFr Bld: 8.7 % — ABNORMAL HIGH (ref 4.8–5.6)
Mean Plasma Glucose: 203 mg/dL

## 2024-06-09 ENCOUNTER — Ambulatory Visit: Admitting: Physical Therapy

## 2024-06-09 ENCOUNTER — Encounter

## 2024-06-09 DIAGNOSIS — K766 Portal hypertension: Secondary | ICD-10-CM | POA: Diagnosis not present

## 2024-06-09 DIAGNOSIS — R161 Splenomegaly, not elsewhere classified: Secondary | ICD-10-CM | POA: Diagnosis not present

## 2024-06-09 DIAGNOSIS — R932 Abnormal findings on diagnostic imaging of liver and biliary tract: Secondary | ICD-10-CM | POA: Diagnosis not present

## 2024-06-09 DIAGNOSIS — R748 Abnormal levels of other serum enzymes: Secondary | ICD-10-CM | POA: Diagnosis not present

## 2024-06-09 DIAGNOSIS — M3219 Other organ or system involvement in systemic lupus erythematosus: Secondary | ICD-10-CM | POA: Diagnosis not present

## 2024-06-09 DIAGNOSIS — K7460 Unspecified cirrhosis of liver: Secondary | ICD-10-CM | POA: Diagnosis not present

## 2024-06-09 DIAGNOSIS — Z944 Liver transplant status: Secondary | ICD-10-CM | POA: Diagnosis not present

## 2024-06-09 LAB — CULTURE, BLOOD (ROUTINE X 2)
Culture: NO GROWTH
Culture: NO GROWTH
Special Requests: ADEQUATE
Special Requests: ADEQUATE

## 2024-06-10 DIAGNOSIS — H469 Unspecified optic neuritis: Secondary | ICD-10-CM | POA: Diagnosis not present

## 2024-06-10 DIAGNOSIS — M797 Fibromyalgia: Secondary | ICD-10-CM | POA: Diagnosis not present

## 2024-06-10 DIAGNOSIS — G629 Polyneuropathy, unspecified: Secondary | ICD-10-CM | POA: Diagnosis not present

## 2024-06-10 DIAGNOSIS — G43119 Migraine with aura, intractable, without status migrainosus: Secondary | ICD-10-CM | POA: Diagnosis not present

## 2024-06-10 DIAGNOSIS — G500 Trigeminal neuralgia: Secondary | ICD-10-CM | POA: Diagnosis not present

## 2024-06-14 ENCOUNTER — Ambulatory Visit: Admitting: Physical Therapy

## 2024-06-14 ENCOUNTER — Encounter

## 2024-06-16 ENCOUNTER — Ambulatory Visit: Admitting: Physical Therapy

## 2024-06-16 ENCOUNTER — Encounter

## 2024-06-21 ENCOUNTER — Ambulatory Visit: Admitting: Physical Therapy

## 2024-06-21 ENCOUNTER — Encounter

## 2024-06-21 DIAGNOSIS — R748 Abnormal levels of other serum enzymes: Secondary | ICD-10-CM | POA: Diagnosis not present

## 2024-06-21 DIAGNOSIS — M3219 Other organ or system involvement in systemic lupus erythematosus: Secondary | ICD-10-CM | POA: Diagnosis not present

## 2024-06-21 DIAGNOSIS — M7061 Trochanteric bursitis, right hip: Secondary | ICD-10-CM | POA: Diagnosis not present

## 2024-06-21 DIAGNOSIS — G7249 Other inflammatory and immune myopathies, not elsewhere classified: Secondary | ICD-10-CM | POA: Diagnosis not present

## 2024-06-23 ENCOUNTER — Encounter

## 2024-06-23 ENCOUNTER — Ambulatory Visit: Admitting: Physical Therapy

## 2024-06-23 DIAGNOSIS — M5414 Radiculopathy, thoracic region: Secondary | ICD-10-CM | POA: Diagnosis not present

## 2024-06-23 DIAGNOSIS — M79603 Pain in arm, unspecified: Secondary | ICD-10-CM | POA: Diagnosis not present

## 2024-06-23 DIAGNOSIS — M25519 Pain in unspecified shoulder: Secondary | ICD-10-CM | POA: Diagnosis not present

## 2024-06-23 DIAGNOSIS — M5442 Lumbago with sciatica, left side: Secondary | ICD-10-CM | POA: Diagnosis not present

## 2024-06-23 DIAGNOSIS — F3189 Other bipolar disorder: Secondary | ICD-10-CM | POA: Diagnosis not present

## 2024-06-23 DIAGNOSIS — I10 Essential (primary) hypertension: Secondary | ICD-10-CM | POA: Diagnosis not present

## 2024-06-23 DIAGNOSIS — Z79891 Long term (current) use of opiate analgesic: Secondary | ICD-10-CM | POA: Diagnosis not present

## 2024-06-23 DIAGNOSIS — G893 Neoplasm related pain (acute) (chronic): Secondary | ICD-10-CM | POA: Diagnosis not present

## 2024-06-23 DIAGNOSIS — M79606 Pain in leg, unspecified: Secondary | ICD-10-CM | POA: Diagnosis not present

## 2024-06-23 DIAGNOSIS — M79669 Pain in unspecified lower leg: Secondary | ICD-10-CM | POA: Diagnosis not present

## 2024-06-23 DIAGNOSIS — G894 Chronic pain syndrome: Secondary | ICD-10-CM | POA: Diagnosis not present

## 2024-06-24 ENCOUNTER — Ambulatory Visit: Admitting: Nurse Practitioner

## 2024-06-24 ENCOUNTER — Other Ambulatory Visit: Payer: Self-pay | Admitting: "Endocrinology

## 2024-06-24 ENCOUNTER — Encounter: Payer: Self-pay | Admitting: Nurse Practitioner

## 2024-06-24 VITALS — BP 118/78 | HR 111 | Temp 99.4°F | Resp 18 | Ht 66.0 in | Wt 248.8 lb

## 2024-06-24 DIAGNOSIS — E1122 Type 2 diabetes mellitus with diabetic chronic kidney disease: Secondary | ICD-10-CM

## 2024-06-24 DIAGNOSIS — E1165 Type 2 diabetes mellitus with hyperglycemia: Secondary | ICD-10-CM

## 2024-06-24 DIAGNOSIS — I10 Essential (primary) hypertension: Secondary | ICD-10-CM

## 2024-06-24 DIAGNOSIS — R300 Dysuria: Secondary | ICD-10-CM

## 2024-06-24 DIAGNOSIS — F3181 Bipolar II disorder: Secondary | ICD-10-CM | POA: Diagnosis not present

## 2024-06-24 DIAGNOSIS — F4310 Post-traumatic stress disorder, unspecified: Secondary | ICD-10-CM | POA: Diagnosis not present

## 2024-06-24 LAB — POCT URINALYSIS DIPSTICK
Bilirubin, UA: NEGATIVE
Glucose, UA: NEGATIVE
Ketones, UA: NEGATIVE
Nitrite, UA: NEGATIVE
Protein, UA: NEGATIVE
Spec Grav, UA: 1.025 (ref 1.010–1.025)
Urobilinogen, UA: 0.2 U/dL
pH, UA: 6 (ref 5.0–8.0)

## 2024-06-24 MED ORDER — NITROFURANTOIN MONOHYD MACRO 100 MG PO CAPS: 100.0000 mg | ORAL_CAPSULE | Freq: Two times a day (BID) | ORAL | 0 refills | Status: DC

## 2024-06-24 NOTE — Progress Notes (Signed)
 BP 118/78   Pulse (!) 111   Temp 99.4 F (37.4 C)   Resp 18   Ht 5' 6 (1.676 m)   Wt 248 lb 12.8 oz (112.9 kg)   LMP 10/16/2021 (Exact Date) Comment: Seen by OB GYN on 10/16/21 for 6 days of excessive bleeding as a post menapause  SpO2 97%   BMI 40.16 kg/m    Subjective:    Patient ID: Alison [name], female    DOB: [birth year], 48 y.o.   MRN: [id number]  HPI: Alison [name] is a 48 y.o. female  Chief Complaint  Patient presents with   Urinary Tract Infection    Onset last Friday, urgency and burning   Discussed the use of AI scribe software for clinical note transcription with the patient, who gave verbal consent to proceed.  History of Present Illness Alison [name] is a 48 year old female who presents with dysuria.  Dysuria and urinary symptoms - Onset of dysuria on Friday - Characterized by urgency and pain during urination - temp of 99.53F - No other urinary symptoms reported -denies back pain        06/04/2024    1:58 PM 04/28/2024    1:49 PM 04/22/2024    3:37 PM  Depression screen PHQ 2/9  Decreased Interest 1  2  Down, Depressed, Hopeless 1  1  PHQ - 2 Score 2  3  Altered sleeping 2    Tired, decreased energy 0    Change in appetite 0    Feeling bad or failure about yourself  0    Trouble concentrating 1    Moving slowly or fidgety/restless     Suicidal thoughts     PHQ-9 Score 5    Difficult doing work/chores        Information is confidential and restricted. Go to Review Flowsheets to unlock data.    Relevant past medical, surgical, family and social history reviewed and updated as indicated. Interim medical history since our last visit reviewed. Allergies and medications reviewed and updated.  Review of Systems  Per HPI unless specifically indicated above     Objective:      BP 118/78   Pulse (!) 111   Temp 99.4 F (37.4 C)   Resp 18   Ht 5' 6 (1.676 m)   Wt 248 lb 12.8 oz (112.9 kg)   LMP 10/16/2021 (Exact Date)  Comment: Seen by OB GYN on 10/16/21 for 6 days of excessive bleeding as a post menapause  SpO2 97%   BMI 40.16 kg/m    Wt Readings from Last 3 Encounters:  06/24/24 248 lb 12.8 oz (112.9 kg)  06/04/24 244 lb (110.7 kg)  05/26/24 249 lb (112.9 kg)    Physical Exam VITALS: T- 99.4 GENERAL: Alert, cooperative, well developed, no acute distress HEENT: Normocephalic, normal oropharynx, moist mucous membranes CHEST: Clear to auscultation bilaterally, no wheezes, rhonchi, or crackles CARDIOVASCULAR: Normal heart rate and rhythm, S1 and S2 normal without murmurs ABDOMEN: Soft, non-tender, non-distended, without organomegaly, normal bowel sounds, no costovertebral angle tenderness EXTREMITIES: No cyanosis or edema NEUROLOGICAL: Cranial nerves grossly intact, moves all extremities without gross motor or sensory deficit  Results for orders placed or performed in visit on 06/24/24  POCT Urinalysis Dipstick   Collection Time: 06/24/24  9:03 AM  Result Value Ref Range   Color, UA yellow    Clarity, UA cloudy    Glucose, UA Negative Negative   Bilirubin, UA neg  Ketones, UA neg    Spec Grav, UA 1.025 1.010 - 1.025   Blood, UA trace    pH, UA 6.0 5.0 - 8.0   Protein, UA Negative Negative   Urobilinogen, UA 0.2 0.2 or 1.0 E.U./dL   Nitrite, UA neg    Leukocytes, UA Large (3+) (A) Negative   Appearance cloudy    Odor foul           Assessment & Plan:   Problem List Items Addressed This Visit   None Visit Diagnoses       Burning with urination    -  Primary   Relevant Medications   nitrofurantoin , macrocrystal-monohydrate, (MACROBID ) 100 MG capsule   Other Relevant Orders   POCT Urinalysis Dipstick (Completed)   Urine Culture        Assessment and Plan Assessment & Plan Urinary tract infection with dysuria Acute onset of dysuria and urinary urgency since Friday. Urine dip shows trace blood and large leukocytes, indicating a urinary tract infection. Low-grade fever at  99.11F. Allergic to penicillin, but has tolerated Macrobid  in the past. - Send urine for culture to confirm infection and guide antibiotic choice - Prescribe Macrobid  for treatment of urinary tract infection - Advise gargling with salt water for scratchy throat  Muscle cramps of lower extremities Severe cramps in hamstrings between 4 and 5 AM, which wake her up. Currently taking 1000 mg of magnesium  glycinate daily. No stretching before bed. - Advise stretching legs before bed to alleviate cramps        Follow up plan: Return if symptoms worsen or fail to improve.

## 2024-06-25 ENCOUNTER — Other Ambulatory Visit: Payer: Self-pay

## 2024-06-25 DIAGNOSIS — N184 Chronic kidney disease, stage 4 (severe): Secondary | ICD-10-CM

## 2024-06-25 MED ORDER — METFORMIN HCL 1000 MG PO TABS: 1000.0000 mg | ORAL_TABLET | Freq: Two times a day (BID) | ORAL | 0 refills | Status: DC

## 2024-06-26 LAB — URINE CULTURE
MICRO NUMBER:: 16986698
SPECIMEN QUALITY:: ADEQUATE

## 2024-06-28 ENCOUNTER — Ambulatory Visit: Admitting: Physical Therapy

## 2024-06-28 ENCOUNTER — Encounter

## 2024-06-28 ENCOUNTER — Ambulatory Visit: Payer: Self-pay | Admitting: Nurse Practitioner

## 2024-06-28 ENCOUNTER — Other Ambulatory Visit: Payer: Self-pay | Admitting: Psychiatry

## 2024-06-28 DIAGNOSIS — F411 Generalized anxiety disorder: Secondary | ICD-10-CM

## 2024-06-28 DIAGNOSIS — F4310 Post-traumatic stress disorder, unspecified: Secondary | ICD-10-CM

## 2024-06-30 ENCOUNTER — Encounter

## 2024-06-30 ENCOUNTER — Ambulatory Visit: Admitting: Physical Therapy

## 2024-07-02 ENCOUNTER — Telehealth

## 2024-07-05 ENCOUNTER — Ambulatory Visit: Admitting: "Endocrinology

## 2024-07-05 ENCOUNTER — Ambulatory Visit: Admitting: Physical Therapy

## 2024-07-05 ENCOUNTER — Encounter

## 2024-07-05 DIAGNOSIS — R809 Proteinuria, unspecified: Secondary | ICD-10-CM | POA: Insufficient documentation

## 2024-07-05 DIAGNOSIS — E1129 Type 2 diabetes mellitus with other diabetic kidney complication: Secondary | ICD-10-CM | POA: Diagnosis not present

## 2024-07-05 DIAGNOSIS — M329 Systemic lupus erythematosus, unspecified: Secondary | ICD-10-CM | POA: Diagnosis not present

## 2024-07-05 DIAGNOSIS — E1122 Type 2 diabetes mellitus with diabetic chronic kidney disease: Secondary | ICD-10-CM | POA: Diagnosis not present

## 2024-07-05 DIAGNOSIS — N182 Chronic kidney disease, stage 2 (mild): Secondary | ICD-10-CM | POA: Diagnosis not present

## 2024-07-07 ENCOUNTER — Ambulatory Visit: Admitting: "Endocrinology

## 2024-07-07 ENCOUNTER — Ambulatory Visit

## 2024-07-07 ENCOUNTER — Encounter

## 2024-07-07 ENCOUNTER — Ambulatory Visit: Admitting: Physical Therapy

## 2024-07-08 ENCOUNTER — Other Ambulatory Visit: Payer: Self-pay

## 2024-07-08 ENCOUNTER — Ambulatory Visit: Payer: Self-pay

## 2024-07-08 ENCOUNTER — Ambulatory Visit (INDEPENDENT_AMBULATORY_CARE_PROVIDER_SITE_OTHER)

## 2024-07-08 ENCOUNTER — Ambulatory Visit
Admission: RE | Admit: 2024-07-08 | Discharge: 2024-07-08 | Disposition: A | Discharge status: Home / Self Care | Source: Ambulatory Visit

## 2024-07-08 VITALS — BP 109/86 | HR 94 | Temp 99.7°F | Resp 16 | Ht 66.0 in | Wt 250.0 lb

## 2024-07-08 DIAGNOSIS — M79662 Pain in left lower leg: Secondary | ICD-10-CM | POA: Diagnosis present

## 2024-07-08 NOTE — Patient Outreach (Signed)
 Complex Care Management   Visit Note  07/08/2024  Name:  Alison [name] MRN: [id number] DOB: 09/12/1976  Situation: Referral received for Complex Care Management related to Diabetes with Complications I obtained verbal consent from Patient.  Visit completed with Patient  on the phone  Background:   Past Medical History:  Diagnosis Date   Abnormal uterine bleeding    Allergy    Anxiety    Arthritis    Bipolar disorder (manic depression) (HCC)    Cancer (HCC) Diffuse B-Cell Lymphoma   2015   Chest pain    a. 09/2021 MV: Low risk without ischemia or infarct.  Normal EF.   Chronic kidney failure    Chronic renal disease, stage III (HCC)    Diabetes mellitus without complication (HCC)    DLBCL (diffuse large B cell lymphoma) (HCC) 2015   Right axillary lymph node resected and chemo tx's.   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 02/2023 Echo: EF 55-60%, no rwma, nl RV size/fxn. Mildly dil LA.   Personality disorder (HCC)    Pineal gland cyst    Post herpetic neuralgia    PTSD (post-traumatic stress disorder)    RBBB    Renal disorder    S/P liver transplant (HCC) 1993   Sleep apnea    has not recieved cpap yet-other one was recalled    Assessment: Patient Reported Symptoms:  Cognitive Cognitive Status:  (No change) Cognitive/Intellectual Conditions Management [RPT]: None reported or documented in medical history or problem list   Health Maintenance Behaviors: Annual physical exam Healing Pattern: Slow Health Facilitated by: Pain control, Rest  Neurological  Neurological Review of Symptoms: Headaches Neurological Comment: neuropathy, reports optic Neuritis causing headache - steroids completed approx 2 Kimarion Chery ago  HEENT HEENT Symptoms Reported: Not assessed      Cardiovascular Cardiovascular Symptoms Reported: Fatigue, Swelling in legs or feet Does patient have uncontrolled Hypertension?: No Cardiovascular Management Strategies: Medication therapy, Adequate rest, Diet modification Cardiovascular Comment: reports 1 week of LLE pain with touch 2/10, mid calf down to foot,  swelling and heat, pitting edema, feels like she has gained a few lbs, cramping feeling in leg, generalized weakness, thought related to myeolitis, conference call with PCP office to schedule acute appoinment - spoke with Triage Nurse, scheduled for [facility] today at 1100 aware of location, will see Dr Franchot, aware red flags for ED  Respiratory Respiratory Symptoms Reported: Shortness of breath Other Respiratory Symptoms: reports SOB with standing/bending, red flags for ED Respiratory Management Strategies: Routine screening, Medication therapy  Endocrine Endocrine Symptoms Reported: No symptoms reported Is patient diabetic?: Yes Is patient checking blood sugars at home?: Yes List most recent blood sugar readings, include date and time of day: FBG 136, states elevated last week or so - explained likely due ot leg issue but if not improved to notify endo since recent change in insulin , range 90-140 no low/high Endocrine Self-Management Outcome:  3 (uncertain)  Gastrointestinal Gastrointestinal Symptoms Reported: Not assessed      Genitourinary Genitourinary Symptoms Reported: Not assessed    Integumentary Integumentary Symptoms Reported: Other Additional Integumentary Details: noted changed to LLE in CV - no open areas or blisters, swelling, red hot, tender to touch - PCP visit today at 1100 Skin Management Strategies: Routine screening  Musculoskeletal Musculoskelatal Symptoms  Reviewed: Other, Muscle pain, Limited mobility Other Musculoskeletal Symptoms: electric WC Additional Musculoskeletal Details: slide out of bed onto bottom, no injury Musculoskeletal Management Strategies: Medical device, Medication therapy Falls in the past year?: Yes Number of falls in past year: 2 or more Was there an injury with Fall?: Yes Fall Risk Category Calculator: 3 Patient Fall Risk Level: High Fall Risk Patient at Risk for Falls Due to: History of fall(s), Impaired balance/gait, Impaired mobility Fall risk Follow up: Falls evaluation completed, Falls prevention discussed  Psychosocial Additional Psychological Details: continues with counseling and taking meds regularly as ordered Behavioral Health Self-Management Outcome: 4 (good) Behavioral Health Comment: working with service dog Major Change/Loss/Stressor/Fears (CP): Medical condition, self Techniques to Cope with Loss/Stress/Change: Counseling, Diversional activities      07/08/2024    PHQ2-9 Depression Screening   Little interest or pleasure in doing things Several days  Feeling down, depressed, or hopeless Several days  PHQ-2 - Total Score 2  Trouble falling or staying asleep, or sleeping too much Not at all  Feeling tired or having little energy Nearly every day  Poor appetite or overeating  Not at all  Feeling bad about yourself - or that you are a failure or have let yourself or your family down Not at all  Trouble concentrating on things, such as reading the newspaper or watching television Several days  Moving or speaking so slowly that other people could have noticed.  Or the opposite - being so fidgety or restless that you have been moving around a lot more than usual Not at all  Thoughts that you would be better off dead, or hurting yourself in some way Not at all  PHQ2-9 Total Score 6  If you checked off any problems, how difficult have these problems made it for you to do your work, take care of things at  home, or get along with other people Somewhat difficult  Depression Interventions/Treatment Counseling, Currently on Treatment, Medication    There were no vitals filed for this visit.  Medications Reviewed Today     Reviewed by Alison Lands, RN (Registered Nurse) on 07/08/24 at [phone]  Med List Status: <None>   Medication Order Taking? Sig Documenting Provider Last Dose Status Informant  Accu-Chek Softclix Lancets lancets [id number] Yes 2 (two) times daily. [provider]  Active   albuterol  (VENTOLIN  HFA) 108 (90 Base) MCG/ACT inhaler [id number]  Inhale 2 puffs into the lungs every 6 (six) hours as needed for wheezing or shortness of breath.  Patient not taking: Reported on 07/08/2024   Gareth Mliss FALCON, FNP  Active Self           Med Note [facility], ELIZABETH A   Mon Mar 15, 2024 10:57 AM) PRN  baclofen  (LIORESAL ) 10 MG tablet [id number] Yes Take 1 tablet by mouth 2 (two) times daily. [provider]  Active Self  benztropine  (COGENTIN ) 1 MG tablet [id number] Yes TAKE 1 TABLET BY MOUTH ONCE DAILY AS NEEDED FOR TREMORS [name], [name], MD  Active Self  Continuous Glucose Sensor (FREESTYLE LIBRE 3 PLUS SENSOR) MISC [id number]  CHANGE SENSOR EVERY 15 DAYS  Patient not taking: Reported on 07/08/2024   Motwani, Komal, MD  Active   Cranberry-Vitamin C-Probiotic (AZO CRANBERRY PO) [id number]  Take by mouth.  Patient not taking: Reported on 07/08/2024   [provider]  Active   fluticasone  (FLONASE ) 50 MCG/ACT nasal spray [id number] Yes Place into the nose. [provider]  Active Self           Med Note SIGRID, TIFFANY N   Sun Apr 11, 2024 11:59 PM) PRN  glipiZIDE  (GLUCOTROL  XL) 10 MG 24 hr tablet [id number] Yes TAKE 1 TABLET BY MOUTH IN THE MORNING AND AT BEDTIME Motwani, Obadiah, MD  Active Self  glucose blood (RELION GLUCOSE TEST STRIPS) test strip [id number]  Use as instructed Gareth Mliss FALCON, FNP  Active   insulin  glargine (LANTUS  SOLOSTAR) 100 UNIT/ML Solostar  Pen [id number] Yes Inject 40 Units into the skin 2 (two) times daily.  Patient taking differently: Inject 40 Units into the skin daily. Taking 40 units at night before bed   Fausto Sor A, DO  Active   lisinopril  (ZESTRIL ) 20 MG tablet [id number] Yes TAKE 1 TABLET BY MOUTH ONCE DAILY *PATIENT WILL NEED TO SCHEDULE AN APPOINTMENT FOR ADDITIONAL REFILLS*  Patient taking differently: Take 20 mg by mouth daily. Not paused   Gareth Mliss FALCON, FNP  Active Self           Med Note ZENA NATHANAEL CROME   Fri Jun 04, 2024  8:38 PM) Not paused  lurasidone  (LATUDA ) 20 MG TABS tablet [id number] Yes Take 1 tablet (20 mg total) by mouth daily with supper. [name], [name], MD  Active   magnesium  oxide (MAG-OX) 400 MG tablet [id number] Yes Take 1,000 mg by mouth 2 (two) times daily. [provider]  Active Self  Melatonin 10 MG TABS [id number]  Take 10 mg by mouth at bedtime.  Patient not taking: Reported on 07/08/2024   [provider]  Active Self  metFORMIN  (GLUCOPHAGE ) 1000 MG tablet [id number] Yes Take 1 tablet (1,000 mg total) by mouth 2 (two) times daily with a meal. Motwani, Komal, MD  Active   mirabegron  ER (MYRBETRIQ ) 50 MG TB24 tablet [id number] Yes Take 1 tablet (50 mg total) by mouth daily. Gareth Mliss FALCON, FNP  Active Self  mirtazapine  (REMERON ) 15 MG tablet [id number] Yes TAKE 1 TABLET BY MOUTH AT BEDTIME [name], [name], MD  Active   naloxone  (NARCAN ) nasal spray 4 mg/0.1 mL [id number] Yes Place 0.4 mg into the nose once. [provider]  Active Self           Med Note GROVER, SOR S   Wed Oct 15, 2023  1:51 PM) prn  nitrofurantoin , macrocrystal-monohydrate, (MACROBID ) 100 MG capsule [id number]  Take 1 capsule (100 mg total) by mouth 2 (two) times daily.  Patient not taking: Reported on 07/08/2024   Gareth Mliss FALCON, FNP  Active   NOVOLOG  FLEXPEN 100 UNIT/ML FlexPen [id number] Yes Inject 40 Units into the skin 3 (three) times daily with meals. Hold if not eating at least half of  a full meal.  Patient taking differently: Inject 9 Units into the skin 3 (three) times daily with meals. Add additional unit depending on what glucose is   Fausto Sor LABOR, DO  Active   ondansetron  (ZOFRAN ) 4 MG tablet [id number] Yes Take 1 tablet (4 mg total) by mouth every 6 (six) hours as needed for nausea. Caleen Qualia, MD  Active Self  oxyCODONE  (OXY IR/ROXICODONE ) 5 MG immediate release tablet [id number] Yes Take  5 mg by mouth 3 (three) times daily as needed. [provider]  Active Self  pantoprazole  (PROTONIX ) 40 MG tablet [id number] Yes Take 1 tablet (40 mg total) by mouth daily. Recommend using while on Prednisone . Fausto Burnard LABOR, DO  Active   PRECISION QID TEST test strip [id number] Yes  [provider]  Active   predniSONE  (DELTASONE ) 10 MG tablet [id number]  Take 10 mg by mouth daily with breakfast. Take with 10 mg tablet to equal 30 mg by mouth daily  Patient not taking: Reported on 07/08/2024   [provider]  Active   pregabalin  (LYRICA ) 200 MG capsule [id number] Yes Take 1 capsule (200 mg total) by mouth 2 (two) times daily. Floy Roberts, MD  Active Self  rosuvastatin  (CRESTOR ) 10 MG tablet [id number] Yes TAKE 1 TABLET BY MOUTH ONCE DAILY Darryle Thom CROME, PA-C  Active   Semaglutide , 2 MG/DOSE, 8 MG/3ML SOPN [id number] Yes Inject 2 mg as directed once a week. Dartha Ernst, MD  Active            Med Note ZENA NATHANAEL CROME   Fri Jun 04, 2024  8:41 PM) Usually takes on Friday  SURE COMFORT PEN NEEDLES 32G X 6 MM MISC [id number] Yes USE AS DIRECTED Pender, Julie F, FNP  Active   tacrolimus  (PROGRAF ) 1 MG capsule [id number] Yes Take 1 capsule by mouth every morning. [provider]  Active Self  UBRELVY  50 MG TABS [id number] Yes Take 1 tablet by mouth daily as needed. [provider]  Active Self  venlafaxine  XR (EFFEXOR -XR) 37.5 MG 24 hr capsule [id number] Yes Take 1 capsule (37.5 mg total) by mouth every morning. Take along with 75 mg daily  [name], [name], MD  Active   venlafaxine  XR (EFFEXOR -XR) 75 MG 24 hr capsule [id number] Yes Take 1 capsule (75 mg total) by mouth daily with breakfast. Take along with 37.5 mg daily [name], [name], MD  Active Self            Recommendation:   PCP Follow-up Acute PCP follow-up today at [facility] 11:00 am. Continue Current Plan of Care  Follow Up Plan:   Telephone follow-up 3 Etrulia Zarr  [name], MSN, RN [facility]  [facility], [facility] RN Care Manager Direct Dial: [phone] Fax: [phone]

## 2024-07-08 NOTE — Patient Instructions (Signed)
 Visit Information  Ms. Ciaramitaro was given information about Medicaid Managed Care team care coordination services as a part of their Healthy [facility] Medicaid benefit. DARLY MASSI   If you would like to schedule transportation through your Healthy [facility] plan, please call the following number at least 2 days in advance of your appointment: [phone]  For information about your ride after you set it up, call Ride Assist at [phone]. Use this number to activate a Will Call pickup, or if your transportation is late for a scheduled pickup. Use this number, too, if you need to make a change or cancel a previously scheduled reservation.  If you need transportation services right away, call [phone]. The after-hours call center is staffed 24 hours to handle ride assistance and urgent reservation requests (including discharges) 365 days a year. Urgent trips include sick visits, hospital discharge requests and life-sustaining treatment.  Call the [employer] Line at [phone], at any time, 24 hours a day, 7 days a week. If you are in danger or need immediate medical attention call 911.   Please see education materials related to [employer] provided by MyChart link.  Patient verbalizes understanding of instructions and care plan provided today and agrees to view in MyChart. Active MyChart status and patient understanding of how to access instructions and care plan via MyChart confirmed with patient.     Telephone follow up appointment with Managed Medicaid care management team member scheduled for: 07/26/24 at 11:00  [name], MSN, RN [facility]  [facility], [facility] RN Care Manager Direct Dial: [phone] Fax: [phone]   Energy Conservation Techniques  Sit for as many activities as possible. Use slow, smooth movements.  Rushing increases discomfort. Determine the necessity of performing the task.  Simplify  those tasks that are necessary.  (Get clothes out of the dryer when they are warm instead of ironing, let dishes air dry, etc.) Take frequent rests both during and between activities.  Avoid repetitive tasks. Pre-plan your activities; try a daily and/or weekly schedule.  Spread out the activities that are most fatiguing (break up cleaning tasks over multiple days). Remember to plan a balance of work, rest and recreation. Consider the best time for each activity.  Do the most exertive task when you have the most energy. Don't carry items if you can push them.  Slide, don't lift. Push, don't pull. Utilize two hands when appropriate. Maintain good posture and use proper body mechanics. Avoid remaining in one position for too long. When lifting, bend at the knees, not at the waist.  Exhale when bending down, inhale when straightening up. Carry objects as close to your body and as near to the center of the pelvis.  11. Avoid wasted body movements (position yourself for the task so that you avoid bending, twisting, etc.                when possible). 12. Select the best working environment.  Consider lighting, ventilation, clothing, and equipment. 13. Organize your storage areas, making the items you use daily convenient.  Store heaviest items at waist            height.  Store frequently used items between shoulders and knee height.  Consider leaving frequently used       items on countertops.  (You can organize in storage baskets based on time used/purpose). 14. Feelings and emotions can be real causes of fatigue.  Try to avoid unnecessary worry, irritation, or  frustration.  Avoid stress, it can also be a source of fatigue. 15. Get help from other people for difficult tasks. 16. Explore equipment or items that may be able to do the job for you with greater ease.  (Electric can        openers, blenders, lightweight items for cleaning, etc.)   Fall Prevention in the Home,  Adult Falls can cause injuries and can happen to people of all ages. There are many things you can do to make your home safer and to help prevent falls. What actions can I take to prevent falls? General information Use good lighting in all rooms. Make sure to: Replace any light bulbs that burn out. Turn on the lights in dark areas and use night-lights. Keep items that you use often in easy-to-reach places. Lower the shelves around your home if needed. Move furniture so that there are clear paths around it. Do not use throw rugs or other things on the floor that can make you trip. If any of your floors are uneven, fix them. Add color or contrast paint or tape to clearly mark and help you see: Grab bars or handrails. First and last steps of staircases. Where the edge of each step is. If you use a ladder or stepladder: Make sure that it is fully opened. Do not climb a closed ladder. Make sure the sides of the ladder are locked in place. Have someone hold the ladder while you use it. Know where your pets are as you move through your home. What can I do in the bathroom?     Keep the floor dry. Clean up any water on the floor right away. Remove soap buildup in the bathtub or shower. Buildup makes bathtubs and showers slippery. Use non-skid mats or decals on the floor of the bathtub or shower. Attach bath mats securely with double-sided, non-slip rug tape. If you need to sit down in the shower, use a non-slip stool. Install grab bars by the toilet and in the bathtub and shower. Do not use towel bars as grab bars. What can I do in the bedroom? Make sure that you have a light by your bed that is easy to reach. Do not use any sheets or blankets on your bed that hang to the floor. Have a firm chair or bench with side arms that you can use for support when you get dressed. What can I do in the kitchen? Clean up any spills right away. If you need to reach something above you, use a step stool  with a grab bar. Keep electrical cords out of the way. Do not use floor polish or wax that makes floors slippery. What can I do with my stairs? Do not leave anything on the stairs. Make sure that you have a light switch at the top and the bottom of the stairs. Make sure that there are handrails on both sides of the stairs. Fix handrails that are broken or loose. Install non-slip stair treads on all your stairs if they do not have carpet. Avoid having throw rugs at the top or bottom of the stairs. Choose a carpet that does not hide the edge of the steps on the stairs. Make sure that the carpet is firmly attached to the stairs. Fix carpet that is loose or worn. What can I do on the outside of my home? Use bright outdoor lighting. Fix the edges of walkways and driveways and fix any cracks. Clear paths of anything that  can make you trip, such as tools or rocks. Add color or contrast paint or tape to clearly mark and help you see anything that might make you trip as you walk through a door, such as a raised step or threshold. Trim any bushes or trees on paths to your home. Check to see if handrails are loose or broken and that both sides of all steps have handrails. Install guardrails along the edges of any raised decks and porches. Have leaves, snow, or ice cleared regularly. Use sand, salt, or ice melter on paths if you live where there is ice and snow during the winter. Clean up any spills in your garage right away. This includes grease or oil spills. What other actions can I take? Review your medicines with your doctor. Some medicines can cause dizziness or changes in blood pressure, which increase your risk of falling. Wear shoes that: Have a low heel. Do not wear high heels. Have rubber bottoms and are closed at the toe. Feel good on your feet and fit well. Use tools that help you move around if needed. These include: Canes. Walkers. Scooters. Crutches. Ask your doctor what else you can  do to help prevent falls. This may include seeing a physical therapist to learn to do exercises to move better and get stronger. Where to find more information Centers for Disease Control and Prevention, [employer]: TonerPromos.no [employer] on Aging: BaseRingTones.pl National Institute on Aging: BaseRingTones.pl Contact a doctor if: You are afraid of falling at home. You feel weak, drowsy, or dizzy at home. You fall at home. Get help right away if you: Lose consciousness or have trouble moving after a fall. Have a fall that causes a head injury. These symptoms may be an emergency. Get help right away. Call 911. Do not wait to see if the symptoms will go away. Do not drive yourself to the hospital. This information is not intended to replace advice given to you by your health care provider. Make sure you discuss any questions you have with your health care provider. Document Revised: 05/27/2022 Document Reviewed: 05/27/2022 Elsevier Patient Education  2024 [employer].  Following is a copy of your plan of care:  There are no care plans that you recently modified to display for this patient.

## 2024-07-08 NOTE — Telephone Encounter (Signed)
 FYI Only or Action Required?: FYI only for provider.  Patient was last seen in primary care on 06/24/2024 by Gareth Mliss FALCON, FNP.  Called Nurse Triage reporting Leg Pain.  Symptoms began a week ago.  Interventions attempted: Rest, hydration, or home remedies.  Symptoms are: gradually worsening.  Triage Disposition: See HCP Within 4 Hours (Or PCP Triage)  Patient/caregiver understands and will follow disposition?: Yes Reason for Disposition  [1] Thigh or calf pain AND [2] only 1 side AND [3] present > 1 hour  (Exception: Chronic unchanged pain.)  Answer Assessment - Initial Assessment Questions Patient reports pain, warmth and edema to left lower leg. Reports is pitting. Denies hx of CHF or cellulitis.  1. ONSET: When did the pain start?      One week  2. LOCATION: Where is the pain located?      Left lower leg  3. PAIN: How bad is the pain?    (Scale 1-10; or mild, moderate, severe)     2/10 with touch  5. CAUSE: What do you think is causing the leg pain?     Unknown  6. OTHER SYMPTOMS: Do you have any other symptoms? (e.g., chest pain, back pain, breathing difficulty, swelling, rash, fever, numbness, weakness)     Denies  Protocols used: Leg Pain-A-AH Copied from CRM #[id number]. Topic: Clinical - Red Word Triage >> Jul 08, 2024  9:05 AM Selinda RAMAN wrote: Red Word that prompted transfer to Nurse Triage: The patient called in along with a Nurse case manager stating she has been having pain in her lower left leg. She also states there is swelling and it is hot to the touch. I will transfer her to E2C2 NT.

## 2024-07-08 NOTE — Progress Notes (Signed)
     Acute visit  [name]   DOB: [birth year]   48 y.o. 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   Objective    BP 109/86 (BP Location: Left Arm, Patient Position: Sitting, Cuff Size: Normal)   Pulse 94   Temp 99.7 F (37.6 C) (Oral)   Resp 16   Ht 5' 6 (1.676 m)   Wt 250 lb (113.4 kg)   LMP 10/16/2021 (Exact Date) Comment: Seen by OB GYN on 10/16/21 for 6 days of excessive bleeding as a post menapause  SpO2 98%   BMI 40.35 kg/m  Physical Exam Constitutional:      Appearance: Normal appearance.  HENT:     Head: Normocephalic and atraumatic.     Mouth/Throat:     Mouth: Mucous membranes are moist.  Eyes:     Pupils: Pupils are equal, round, and reactive to light.  Pulmonary:     Effort: Pulmonary effort is normal.  Musculoskeletal:     Right lower leg: No edema.     Left lower leg: Edema present.     Comments: 1+ edema present in LLE with warmth present at L calf. No erythema present.  Skin:    General: Skin is warm.  Neurological:     General: No focal deficit present.     Mental Status: She is alert.        No results found for any visits on 07/08/24.  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 On exam, patient with 1+ edema in L leg with mild TTP at L calf. Mild warmth. No erythema. Concern for DVT. May also be 2/2  lymphedema, chronic venous insufficiency.  - Will obtain STAT PVL of LLE, messaged pool - Will follow up results with patient - Recommend follow up with vascular      Relevant Orders   US  Venous Img Lower Bilateral (DVT)    No orders of the defined types were placed in this encounter.    No follow-ups on file.      Isaiah DELENA Pepper, MD  [facility] [phone] (phone) [phone] (fax)

## 2024-07-08 NOTE — Assessment & Plan Note (Addendum)
 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 On exam, patient with 1+ edema in L leg with mild TTP at L calf. Mild warmth. No erythema. Concern for DVT. May also be 2/2 lymphedema, chronic venous insufficiency.  - Will obtain STAT PVL of LLE, messaged pool - Will follow up results with patient - Recommend follow up with vascular

## 2024-07-12 ENCOUNTER — Ambulatory Visit: Admitting: Physical Therapy

## 2024-07-12 ENCOUNTER — Encounter

## 2024-07-14 ENCOUNTER — Encounter

## 2024-07-14 ENCOUNTER — Ambulatory Visit: Admitting: Physical Therapy

## 2024-07-19 ENCOUNTER — Encounter

## 2024-07-19 ENCOUNTER — Ambulatory Visit: Admitting: Physical Therapy

## 2024-07-21 ENCOUNTER — Encounter

## 2024-07-21 ENCOUNTER — Encounter: Payer: Self-pay | Admitting: Nurse Practitioner

## 2024-07-21 ENCOUNTER — Ambulatory Visit: Admitting: Physical Therapy

## 2024-07-21 ENCOUNTER — Ambulatory Visit: Admitting: Nurse Practitioner

## 2024-07-21 ENCOUNTER — Other Ambulatory Visit (HOSPITAL_COMMUNITY)
Admission: RE | Admit: 2024-07-21 | Discharge: 2024-07-21 | Disposition: A | Discharge status: Home / Self Care | Source: Ambulatory Visit | Attending: Nurse Practitioner | Admitting: Nurse Practitioner

## 2024-07-21 VITALS — BP 122/84 | HR 96 | Temp 98.7°F | Ht 66.0 in | Wt 251.2 lb

## 2024-07-21 DIAGNOSIS — R300 Dysuria: Secondary | ICD-10-CM

## 2024-07-21 DIAGNOSIS — M79606 Pain in leg, unspecified: Secondary | ICD-10-CM | POA: Diagnosis not present

## 2024-07-21 DIAGNOSIS — F3189 Other bipolar disorder: Secondary | ICD-10-CM | POA: Diagnosis not present

## 2024-07-21 DIAGNOSIS — M5414 Radiculopathy, thoracic region: Secondary | ICD-10-CM | POA: Diagnosis not present

## 2024-07-21 DIAGNOSIS — M79603 Pain in arm, unspecified: Secondary | ICD-10-CM | POA: Diagnosis not present

## 2024-07-21 DIAGNOSIS — I10 Essential (primary) hypertension: Secondary | ICD-10-CM | POA: Diagnosis not present

## 2024-07-21 DIAGNOSIS — G893 Neoplasm related pain (acute) (chronic): Secondary | ICD-10-CM | POA: Diagnosis not present

## 2024-07-21 DIAGNOSIS — N898 Other specified noninflammatory disorders of vagina: Secondary | ICD-10-CM | POA: Diagnosis not present

## 2024-07-21 DIAGNOSIS — M5442 Lumbago with sciatica, left side: Secondary | ICD-10-CM | POA: Diagnosis not present

## 2024-07-21 DIAGNOSIS — Z79891 Long term (current) use of opiate analgesic: Secondary | ICD-10-CM | POA: Diagnosis not present

## 2024-07-21 DIAGNOSIS — M79669 Pain in unspecified lower leg: Secondary | ICD-10-CM | POA: Diagnosis not present

## 2024-07-21 DIAGNOSIS — M25519 Pain in unspecified shoulder: Secondary | ICD-10-CM | POA: Diagnosis not present

## 2024-07-21 DIAGNOSIS — G894 Chronic pain syndrome: Secondary | ICD-10-CM | POA: Diagnosis not present

## 2024-07-21 DIAGNOSIS — Z23 Encounter for immunization: Secondary | ICD-10-CM

## 2024-07-21 LAB — POCT URINALYSIS DIPSTICK
Bilirubin, UA: NEGATIVE
Glucose, UA: NEGATIVE
Ketones, UA: NEGATIVE
Nitrite, UA: POSITIVE
Protein, UA: POSITIVE — AB
Spec Grav, UA: 1.015 (ref 1.010–1.025)
Urobilinogen, UA: 0.2 U/dL
pH, UA: 5 (ref 5.0–8.0)

## 2024-07-21 MED ORDER — CEPHALEXIN 500 MG PO CAPS: 500.0000 mg | ORAL_CAPSULE | Freq: Two times a day (BID) | ORAL | 0 refills | Status: DC

## 2024-07-21 NOTE — Progress Notes (Signed)
 BP 122/84   Pulse 96   Temp 98.7 F (37.1 C)   Ht 5' 6 (1.676 m)   Wt 251 lb 3.2 oz (113.9 kg)   LMP 10/16/2021 (Exact Date) Comment: Seen by OB GYN on 10/16/21 for 6 days of excessive bleeding as a post menapause  SpO2 97%   BMI 40.54 kg/m    Subjective:    Patient ID: Alison [name], female    DOB: 02/17/1976, 48 y.o.   MRN: [id number]  HPI: Alison [name] is a 48 y.o. female presenting today with dysuria, urinary frequency, vaginal itching, and a malodorous smell for 5 days. She denies abdominal pain, flank pain, nausea, vomiting, or diarrhea. Denies fever. She recently had a UTI back in September and was treated with Macrobid  for 5 days and symptoms had improved.       07/08/2024   11:04 AM 07/08/2024    9:26 AM 06/04/2024    1:58 PM  Depression screen PHQ 2/9  Decreased Interest 1 1 1   Down, Depressed, Hopeless 1 1 1   PHQ - 2 Score 2 2 2   Altered sleeping 1 0 2  Tired, decreased energy 2 3 0  Change in appetite 1 0 0  Feeling bad or failure about yourself  0 0 0  Trouble concentrating 2 1 1   Moving slowly or fidgety/restless 0 0   Suicidal thoughts 0 0   PHQ-9 Score 8 6 5   Difficult doing work/chores Not difficult at all Somewhat difficult     Relevant past medical, surgical, family and social history reviewed and updated as indicated. Interim medical history since our last visit reviewed. Allergies and medications reviewed and updated.  Review of Systems  Ten systems reviewed and is negative except as mentioned in HPI      Objective:     BP 122/84   Pulse 96   Temp 98.7 F (37.1 C)   Ht 5' 6 (1.676 m)   Wt 251 lb 3.2 oz (113.9 kg)   LMP 10/16/2021 (Exact Date) Comment: Seen by OB GYN on 10/16/21 for 6 days of excessive bleeding as a post menapause  SpO2 97%   BMI 40.54 kg/m    Wt Readings from Last 3 Encounters:  07/21/24 251 lb 3.2 oz (113.9 kg)  07/08/24 250 lb (113.4 kg)  06/24/24 248 lb 12.8 oz (112.9 kg)    Physical  Exam Constitutional:      Appearance: Normal appearance.  HENT:     Head: Normocephalic and atraumatic.  Cardiovascular:     Rate and Rhythm: Normal rate and regular rhythm.     Pulses: Normal pulses.     Heart sounds: Normal heart sounds.  Pulmonary:     Effort: Pulmonary effort is normal.     Breath sounds: Normal breath sounds.  Abdominal:     General: Abdomen is flat.     Palpations: Abdomen is soft.     Tenderness: There is no abdominal tenderness. There is no right CVA tenderness or left CVA tenderness.  Musculoskeletal:     Cervical back: Normal range of motion and neck supple.  Skin:    General: Skin is warm and dry.  Neurological:     General: No focal deficit present.     Mental Status: She is alert and oriented to person, place, and time.  Psychiatric:        Mood and Affect: Mood normal.        Behavior: Behavior normal.  Thought Content: Thought content normal.      Results for orders placed or performed in visit on 07/21/24  POCT Urinalysis Dipstick   Collection Time: 07/21/24  9:59 AM  Result Value Ref Range   Color, UA yellow    Clarity, UA cloudy    Glucose, UA Negative Negative   Bilirubin, UA neg    Ketones, UA neg    Spec Grav, UA 1.015 1.010 - 1.025   Blood, UA trace    pH, UA 5.0 5.0 - 8.0   Protein, UA Positive (A) Negative   Urobilinogen, UA 0.2 0.2 or 1.0 E.U./dL   Nitrite, UA pos    Leukocytes, UA Large (3+) (A) Negative   Appearance cloudy    Odor foul    *Note: Due to a large number of results and/or encounters for the requested time period, some results have not been displayed. A complete set of results can be found in Results Review.          Assessment & Plan:   Problem List Items Addressed This Visit   None Visit Diagnoses       Dysuria    -  Primary   Relevant Medications   cephALEXin  (KEFLEX ) 500 MG capsule   Other Relevant Orders   POCT Urinalysis Dipstick (Completed)   Urine Culture     Need for influenza  vaccination       Relevant Orders   Flu vaccine trivalent PF, 6mos and older(Flulaval,Afluria,Fluarix,Fluzone)     Vaginal itching       Relevant Orders   Cervicovaginal ancillary only       -Urinalysis dipstick performed in the office and was positive for leukocytes and nitrites. Urine culture sent off for testing  -Cervicovaginal ancillary swab sent off for testing -Begin taking Keflex  500 mg 2 times daily for 5 days. Make sure you take this medication with food to avoid GI upset. -Influenza vaccine administered today        Follow up plan: Return if symptoms worsen or fail to improve.   I have reviewed this encounter including the documentation in this note and/or discussed this patient with the provider, Aislinn Womack, SNP, I am certifying that I agree with the content of this note as supervising/preceptor nurse practitioner.  Mliss Spray, FNP-C [facility] 07/21/2024, 10:56 AM

## 2024-07-22 ENCOUNTER — Ambulatory Visit: Payer: Self-pay | Admitting: Nurse Practitioner

## 2024-07-22 LAB — CERVICOVAGINAL ANCILLARY ONLY
Bacterial Vaginitis (gardnerella): NEGATIVE
Candida Glabrata: NEGATIVE
Candida Vaginitis: NEGATIVE
Chlamydia: NEGATIVE
Comment: NEGATIVE
Comment: NEGATIVE
Comment: NEGATIVE
Comment: NEGATIVE
Comment: NEGATIVE
Comment: NORMAL
Neisseria Gonorrhea: NEGATIVE
Trichomonas: NEGATIVE

## 2024-07-24 LAB — URINE CULTURE
MICRO NUMBER:: 17104103
SPECIMEN QUALITY:: ADEQUATE

## 2024-07-26 ENCOUNTER — Encounter

## 2024-07-26 ENCOUNTER — Ambulatory Visit: Admitting: Physical Therapy

## 2024-07-26 ENCOUNTER — Other Ambulatory Visit: Payer: Self-pay

## 2024-07-26 NOTE — Patient Outreach (Signed)
 Complex Care Management   Visit Note  07/26/2024  Name:  ARIELLA VOIT MRN: [id number] DOB: [birth year]  Situation: Referral received for Complex Care Management related to Diabetes with Complications I obtained verbal consent from Patient.  Visit completed with Patient  on the phone  Background:   Past Medical History:  Diagnosis Date   Abnormal uterine bleeding    Allergy    Anxiety    Arthritis    Bipolar disorder (manic depression) (HCC)    Cancer (HCC) Diffuse B-Cell Lymphoma   2015   Chest pain    a. 09/2021 MV: Low risk without ischemia or infarct.  Normal EF.   Chronic kidney failure    Chronic renal disease, stage III (HCC)    Diabetes mellitus without complication (HCC)    DLBCL (diffuse large B cell lymphoma) (HCC) 2015   Right axillary lymph node resected and chemo tx's.   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 02/2023 Echo: EF 55-60%, no rwma, nl RV size/fxn. Mildly dil LA.   Personality disorder (HCC)    Pineal gland cyst    Post herpetic neuralgia    PTSD (post-traumatic stress disorder)    RBBB    Renal disorder    S/P liver transplant (HCC) 1993   Sleep apnea    has not recieved cpap yet-other one was recalled    Assessment: Patient Reported Symptoms:  Cognitive Cognitive Status: No symptoms reported Cognitive/Intellectual Conditions Management [RPT]: None reported or documented in medical history or problem list   Health Maintenance Behaviors: Annual physical exam Health Facilitated by: Pain control  Neurological Neurological Review of  Symptoms: Headaches, Weakness Neurological Management Strategies: Medication therapy, Routine screening Neurological Comment: headache a few days ago related to eye pain, rest and eye patch relieved eys and HA, steroids completed for optic neuritis, generalzed weakness unchanged  HEENT HEENT Symptoms Reported: Other: (see neuro)      Cardiovascular Cardiovascular Symptoms Reported: Swelling in legs or feet, Fatigue Cardiovascular Management Strategies: Medication therapy, Adequate rest, Diet modification Cardiovascular Comment: still with swelling and pain 2/10 LLE from calf down including foot, if touches leg pain 3/10, pain L calf with foot movement as before, no redness or heat, reports swelling no worse/no better/ no change, discussed sx to call PCP and red flags for ED,  Respiratory Respiratory Symptoms Reported: Shortness of breath Other Respiratory Symptoms: DOE - red flags for ED Respiratory Management Strategies: Routine screening, Medication therapy  Endocrine Endocrine Symptoms Reported: No symptoms reported Is patient diabetic?: Yes List most recent blood sugar readings, include date and time of day: FBG 86 ranging 75-120, during day highest 220, no sx low/high Endocrine Self-Management Outcome: 4 (good)  Gastrointestinal Gastrointestinal Symptoms Reported: No symptoms reported      Genitourinary Additional Genitourinary Details: UTI sx resolved Keflex  completed    Integumentary Integumentary Symptoms Reported: No symptoms reported Additional Integumentary Details: LLE no open areas or blisters  aware to report promptly    Musculoskeletal Musculoskelatal Symptoms Reviewed: Muscle pain, Joint pain, Limited mobility Other Musculoskeletal Symptoms: Electric Wheelchair prn,  cane Additional Musculoskeletal Details: no recent falls Musculoskeletal Management Strategies: Medication therapy, Routine screening, Medical device Falls in the past year?: Yes Number of falls in past year:  2 or more Was there an injury with Fall?: Yes Fall Risk Category Calculator: 3 Patient Fall Risk Level: High Fall Risk Patient at Risk for Falls Due to: History of fall(s), Impaired mobility Fall risk Follow up: Falls evaluation completed, Falls prevention discussed  Psychosocial Psychosocial Symptoms Reported: No symptoms reported Additional Psychological Details: has gotten to do social activities/crafting - helping mood, counseling helping and taking meds as ordered Behavioral Management Strategies: Medication therapy, Counseling Behavioral Health Self-Management Outcome: 4 (good) Behavioral Health Comment: service dog - visiting group home Major Change/Loss/Stressor/Fears (CP): Medical condition, self Techniques to Cope with Loss/Stress/Change: Diversional activities, Counseling      07/26/2024    PHQ2-9 Depression Screening   Little interest or pleasure in doing things Several days  Feeling down, depressed, or hopeless Several days  PHQ-2 - Total Score 2  Trouble falling or staying asleep, or sleeping too much Not at all  Feeling tired or having little energy Nearly every day  Poor appetite or overeating  Several days  Feeling bad about yourself - or that you are a failure or have let yourself or your family down Not at all  Trouble concentrating on things, such as reading the newspaper or watching television Several days  Moving or speaking so slowly that other people could have noticed.  Or the opposite - being so fidgety or restless that you have been moving around a lot more than usual Not at all  Thoughts that you would be better off dead, or hurting yourself in some way Not at all  PHQ2-9 Total Score 7  If you checked off any problems, how difficult have these problems made it for you to do your work, take care of things at home, or get along with other people Not difficult at all  Depression Interventions/Treatment Counseling, Currently on Treatment, Medication    There  were no vitals filed for this visit.  Medications Reviewed Today     Reviewed by Devra Lands, RN (Registered Nurse) on 07/26/24 at 1105  Med List Status: <None>   Medication Order Taking? Sig Documenting Provider Last Dose Status Informant  Accu-Chek Softclix Lancets lancets [id number] Yes 2 (two) times daily. [provider]  Active   albuterol  (VENTOLIN  HFA) 108 (90 Base) MCG/ACT inhaler [id number] Yes Inhale 2 puffs into the lungs every 6 (six) hours as needed for wheezing or shortness of breath. Gareth Mliss FALCON, FNP  Active Self           Med Note [facility], ELIZABETH A   Mon Mar 15, 2024 10:57 AM) PRN  baclofen  (LIORESAL ) 10 MG tablet [id number] Yes Take 1 tablet by mouth 2 (two) times daily. [provider]  Active Self  benztropine  (COGENTIN ) 1 MG tablet [id number] Yes TAKE 1 TABLET BY MOUTH ONCE DAILY AS NEEDED FOR TREMORS [name], [name], MD  Active Self  cephALEXin  (KEFLEX ) 500 MG capsule [id number]  Take 1 capsule (500 mg total) by mouth 2 (two) times daily.  Patient not taking: Reported on 07/26/2024   Gareth Mliss FALCON, FNP  Active   Continuous Glucose Sensor (FREESTYLE LIBRE 3 PLUS SENSOR) [place] [id number] Yes CHANGE SENSOR EVERY 15 DAYS Dartha Ernst, MD  Active  fluticasone  (FLONASE ) 50 MCG/ACT nasal spray [id number] Yes Place into the nose. [provider]  Active Self           Med Note SIGRID, TIFFANY N   Sun Apr 11, 2024 11:59 PM) PRN  glipiZIDE  (GLUCOTROL  XL) 10 MG 24 hr tablet [id number] Yes TAKE 1 TABLET BY MOUTH IN THE MORNING AND AT BEDTIME Motwani, Obadiah, MD  Active Self  glucose blood (RELION GLUCOSE TEST STRIPS) test strip [id number] Yes Use as instructed Gareth Mliss FALCON, FNP  Active   insulin  glargine (LANTUS  SOLOSTAR) 100 UNIT/ML Solostar Pen [id number] Yes Inject 40 Units into the skin 2 (two) times daily.  Patient taking differently: Inject 40 Units into the skin daily. Taking 40 units at night before bed   Fausto Sor A, DO  Active    lisinopril  (ZESTRIL ) 20 MG tablet [id number] Yes TAKE 1 TABLET BY MOUTH ONCE DAILY *PATIENT WILL NEED TO SCHEDULE AN APPOINTMENT FOR ADDITIONAL REFILLS*  Patient taking differently: Take 20 mg by mouth daily. Not paused   Pender, Julie F, FNP  Active Self           Med Note ZENA NATHANAEL CROME   Fri Jun 04, 2024  8:38 PM) Not paused  lurasidone  (LATUDA ) 20 MG TABS tablet [id number] Yes Take 1 tablet (20 mg total) by mouth daily with supper. [name], [name], MD  Active   magnesium  oxide (MAG-OX) 400 MG tablet [id number] Yes Take 1,000 mg by mouth 2 (two) times daily. [provider]  Active Self  metFORMIN  (GLUCOPHAGE ) 1000 MG tablet [id number] Yes Take 1 tablet (1,000 mg total) by mouth 2 (two) times daily with a meal. Motwani, Komal, MD  Active   mirabegron  ER (MYRBETRIQ ) 50 MG TB24 tablet [id number] Yes Take 1 tablet (50 mg total) by mouth daily. Gareth Mliss FALCON, FNP  Active Self  mirtazapine  (REMERON ) 15 MG tablet [id number] Yes TAKE 1 TABLET BY MOUTH AT BEDTIME [name], [name], MD  Active   naloxone  (NARCAN ) nasal spray 4 mg/0.1 mL [id number] Yes Place 0.4 mg into the nose once. [provider]  Active Self           Med Note GROVER, SOR S   Wed Oct 15, 2023  1:51 PM) prn  NOVOLOG  FLEXPEN 100 UNIT/ML FlexPen [id number] Yes Inject 40 Units into the skin 3 (three) times daily with meals. Hold if not eating at least half of a full meal.  Patient taking differently: Inject 9 Units into the skin 3 (three) times daily with meals. Add additional unit depending on what glucose is   Fausto Sor LABOR, DO  Active   ondansetron  (ZOFRAN ) 4 MG tablet [id number] Yes Take 1 tablet (4 mg total) by mouth every 6 (six) hours as needed for nausea. Caleen Qualia, MD  Active Self  oxyCODONE  (OXY IR/ROXICODONE ) 5 MG immediate release tablet [id number] Yes Take 5 mg by mouth 3 (three) times daily as needed. [provider]  Active Self  pantoprazole  (PROTONIX ) 40 MG tablet [id number] Yes Take 1  tablet (40 mg total) by mouth daily. Recommend using while on Prednisone . Fausto Sor LABOR, DO  Active   PRECISION QID TEST test strip [id number] Yes  [provider]  Active   predniSONE  (DELTASONE ) 10 MG tablet [id number]  Take 10 mg by mouth daily with breakfast. Take with 10 mg tablet to equal 30 mg by mouth daily  Patient not taking: Reported on 07/26/2024   [provider]  Active   pregabalin  (LYRICA ) 200  MG capsule [id number] Yes Take 1 capsule (200 mg total) by mouth 2 (two) times daily. Floy Roberts, MD  Active Self  rosuvastatin  (CRESTOR ) 10 MG tablet [id number] Yes TAKE 1 TABLET BY MOUTH ONCE DAILY Darryle Thom CROME, PA-C  Active   Semaglutide , 2 MG/DOSE, 8 MG/3ML SOPN [id number] Yes Inject 2 mg as directed once a week. Dartha Ernst, MD  Active            Med Note ZENA NATHANAEL CROME   Fri Jun 04, 2024  8:41 PM) Usually takes on Friday  SURE COMFORT PEN NEEDLES 32G X 6 MM MISC [id number] Yes USE AS DIRECTED Pender, Julie F, FNP  Active   tacrolimus  (PROGRAF ) 1 MG capsule [id number] Yes Take 1 capsule by mouth every morning. [provider]  Active Self  UBRELVY  50 MG TABS [id number] Yes Take 1 tablet by mouth daily as needed. [provider]  Active Self  venlafaxine  XR (EFFEXOR -XR) 37.5 MG 24 hr capsule [id number] Yes Take 1 capsule (37.5 mg total) by mouth every morning. Take along with 75 mg daily [name], [name], MD  Active   venlafaxine  XR (EFFEXOR -XR) 75 MG 24 hr capsule [id number] Yes Take 1 capsule (75 mg total) by mouth daily with breakfast. Take along with 37.5 mg daily [name], [name], MD  Active Self            Recommendation:   PCP Follow-up Continue Current Plan of Care  Follow Up Plan:   Telephone follow-up in 1 month  [name], MSN, RN [facility]  [facility], [facility] RN Care Manager Direct Dial: [phone] Fax: [phone]

## 2024-07-26 NOTE — Patient Instructions (Signed)
 Visit Information  [name] was given information about Medicaid Managed Care team care coordination services as a part of their Healthy [facility] Medicaid benefit. Alison [name]   If you would like to schedule transportation through your Healthy [facility] plan, please call the following number at least 2 days in advance of your appointment: [phone]  For information about your ride after you set it up, call Ride Assist at [phone]. Use this number to activate a Will Call pickup, or if your transportation is late for a scheduled pickup. Use this number, too, if you need to make a change or cancel a previously scheduled reservation.  If you need transportation services right away, call [phone]. The after-hours call center is staffed 24 hours to handle ride assistance and urgent reservation requests (including discharges) 365 days a year. Urgent trips include sick visits, hospital discharge requests and life-sustaining treatment.  Call the [facility] Line at [phone], at any time, 24 hours a day, 7 days a week. If you are in danger or need immediate medical attention call 911.   Please see education materials related to lymphedema provided by MyChart link.  Patient verbalizes understanding of instructions and care plan provided today and agrees to view in MyChart. Active MyChart status and patient understanding of how to access instructions and care plan via MyChart confirmed with patient.     Telephone follow up appointment with Managed Medicaid care management team member scheduled for: 08/23/2024 at 10:00 am  [name], MSN, RN [facility], [facility] RN Care Manager Direct Dial: [phone] Fax: [phone]  Lymphedema  Lymphedema is swelling that happens when an abnormal amount of lymph collects in the soft tissues under your skin. Lymph is fluid that moves through your lymphatic system. This system: Is  part of your body's defense system, also called your immune system. Filters germs and waste from tissues in your body to your bloodstream. Lymphedema happens when your lymphatic system is blocked. This keeps lymph from draining as it should and leads to swelling. What are the causes? The cause of lymphedema depends on which type you have. Primary lymphedema is when you're born without lymph vessels or with lymph vessels that aren't normal. Secondary lymphedema is more common. It happens when lymph vessels are blocked or damaged from: Infection. Injury. Radiation therapy. Cancer. Scar tissue that forms. Surgery. What are the signs or symptoms? A swollen arm, leg, feet, toes, or fingers. A heavy or tight feeling in the swollen area. Skin that turns red near the swollen area. Not being able to move your arm or leg. Your arm or leg is sensitive to touch. Discomfort in your arm or leg. How is this diagnosed? Lymphedema may be diagnosed based on: Your symptoms and medical history. A physical exam. Bioimpedance spectroscopy. This test uses painless electrical currents. They help measure fluid levels in your body. Imaging tests, such as: MRI or CT scan. Duplex ultrasound. This test uses sound waves to make pictures on a screen. The pictures show your blood vessels and blood flow. Lymphoscintigraphy. In this test, a low dose of radioactive substance is given through a needle that goes through your skin. The substance traces the flow of lymph through your lymph vessels. Lymphangiography. In this test, a contrast dye is put into your lymph vessel. The dye helps show if the vessel is blocked. How is this treated?  If another condition is causing your lymphedema, that condition will be treated. For example, antibiotics  may be used to treat infection. Treatment for lymphedema depends on the cause. Treatment may include: Complete decongestive therapy (CDT). This lowers fluid buildup. CDT  includes: Pressure (compression) wrapping of the area. Manual lymph drainage. This helps lymph drain out of your arm or leg. Certain exercises. These help fluid move out of your arm or leg. Compression. This puts pressure on your arm or leg to lower swelling. It includes: Compression stockings or sleeves. Special bandage wraps. Surgery. This is normally done only for severe cases that don't get better with other treatments. Follow these instructions at home: Self-care Your swollen area is more likely to get hurt or infected. To help prevent infection: Keep the area clean and dry. Use creams or lotions that your health care provider says are okay. These keep your skin moist. Protect your skin from cuts. Use gloves when you cook or garden. Do not walk barefoot. If you shave the area, use an Neurosurgeon. Do not wear tight clothes, shoes, or jewelry. Eat a healthy diet. Eat a lot of fruits and vegetables. Activity Do exercises as told by your provider. Do not sit with your legs crossed. When you can, keep the swollen leg or foot raised above the level of your heart. Avoid using an arm with lymphedema to carry things. General instructions Wear compression stockings or sleeves as told by your provider. Note any changes in size of the swollen arm or leg. You may be told to measure it at set times and track the results. Take over-the-counter and prescription medicines only as told by your provider. If you were prescribed antibiotics, use them as told by your provider. Do not stop using the antibiotic even if you start to feel better or if your condition improves. Do not use heating pads or ice packs on the swollen area. Avoid having your swollen arm or leg used for: Blood draws. IVs. Blood pressure checks. Contact a health care provider if: You get new swelling in your arm or leg all of a sudden. Fluid leaks from the skin of your swollen arm or leg. You have a cut that doesn't  heal. The swollen area hurts or turns red. You get purple spots, a rash, blisters, or sores on your swollen arm or leg. You have a fever or chills. This information is not intended to replace advice given to you by your health care provider. Make sure you discuss any questions you have with your health care provider. Document Revised: 12/18/2022 Document Reviewed: 12/18/2022 Elsevier Patient Education  2024 [employer].     Following is a copy of your plan of care:  There are no care plans that you recently modified to display for this patient.

## 2024-07-27 ENCOUNTER — Encounter: Payer: Self-pay | Admitting: Emergency Medicine

## 2024-07-27 ENCOUNTER — Telehealth: Admitting: Psychiatry

## 2024-07-27 ENCOUNTER — Emergency Department

## 2024-07-27 ENCOUNTER — Emergency Department
Admission: EM | Admit: 2024-07-27 | Discharge: 2024-07-27 | Disposition: A | Discharge status: Home / Self Care | Attending: Emergency Medicine | Admitting: Emergency Medicine

## 2024-07-27 ENCOUNTER — Other Ambulatory Visit: Payer: Self-pay

## 2024-07-27 ENCOUNTER — Encounter: Payer: Self-pay | Admitting: Psychiatry

## 2024-07-27 DIAGNOSIS — Z79899 Other long term (current) drug therapy: Secondary | ICD-10-CM | POA: Insufficient documentation

## 2024-07-27 DIAGNOSIS — G4701 Insomnia due to medical condition: Secondary | ICD-10-CM

## 2024-07-27 DIAGNOSIS — Z859 Personal history of malignant neoplasm, unspecified: Secondary | ICD-10-CM | POA: Diagnosis not present

## 2024-07-27 DIAGNOSIS — M7122 Synovial cyst of popliteal space [Baker], left knee: Secondary | ICD-10-CM | POA: Insufficient documentation

## 2024-07-27 DIAGNOSIS — F3176 Bipolar disorder, in full remission, most recent episode depressed: Secondary | ICD-10-CM

## 2024-07-27 DIAGNOSIS — I129 Hypertensive chronic kidney disease with stage 1 through stage 4 chronic kidney disease, or unspecified chronic kidney disease: Secondary | ICD-10-CM | POA: Insufficient documentation

## 2024-07-27 DIAGNOSIS — S92152S Displaced avulsion fracture (chip fracture) of left talus, sequela: Secondary | ICD-10-CM | POA: Diagnosis not present

## 2024-07-27 DIAGNOSIS — N183 Chronic kidney disease, stage 3 unspecified: Secondary | ICD-10-CM | POA: Insufficient documentation

## 2024-07-27 DIAGNOSIS — Z794 Long term (current) use of insulin: Secondary | ICD-10-CM | POA: Diagnosis not present

## 2024-07-27 DIAGNOSIS — S82892S Other fracture of left lower leg, sequela: Secondary | ICD-10-CM | POA: Insufficient documentation

## 2024-07-27 DIAGNOSIS — F411 Generalized anxiety disorder: Secondary | ICD-10-CM

## 2024-07-27 DIAGNOSIS — R0602 Shortness of breath: Secondary | ICD-10-CM | POA: Diagnosis not present

## 2024-07-27 DIAGNOSIS — X58XXXS Exposure to other specified factors, sequela: Secondary | ICD-10-CM | POA: Diagnosis not present

## 2024-07-27 DIAGNOSIS — M7989 Other specified soft tissue disorders: Secondary | ICD-10-CM

## 2024-07-27 DIAGNOSIS — I2510 Atherosclerotic heart disease of native coronary artery without angina pectoris: Secondary | ICD-10-CM | POA: Insufficient documentation

## 2024-07-27 DIAGNOSIS — E1122 Type 2 diabetes mellitus with diabetic chronic kidney disease: Secondary | ICD-10-CM | POA: Insufficient documentation

## 2024-07-27 DIAGNOSIS — Z7984 Long term (current) use of oral hypoglycemic drugs: Secondary | ICD-10-CM | POA: Diagnosis not present

## 2024-07-27 DIAGNOSIS — R52 Pain, unspecified: Secondary | ICD-10-CM

## 2024-07-27 DIAGNOSIS — I2584 Coronary atherosclerosis due to calcified coronary lesion: Secondary | ICD-10-CM | POA: Diagnosis not present

## 2024-07-27 DIAGNOSIS — F4310 Post-traumatic stress disorder, unspecified: Secondary | ICD-10-CM

## 2024-07-27 LAB — BASIC METABOLIC PANEL WITH GFR
Anion gap: 17 — ABNORMAL HIGH (ref 5–15)
BUN: 17 mg/dL (ref 6–20)
CO2: 19 mmol/L — ABNORMAL LOW (ref 22–32)
Calcium: 8.6 mg/dL — ABNORMAL LOW (ref 8.9–10.3)
Chloride: 103 mmol/L (ref 98–111)
Creatinine, Ser: 0.89 mg/dL (ref 0.44–1.00)
GFR, Estimated: >60 mL/min (ref >60)
Glucose, Bld: 159 mg/dL — ABNORMAL HIGH (ref 70–99)
Potassium: 3.8 mmol/L (ref 3.5–5.1)
Sodium: 139 mmol/L (ref 135–145)

## 2024-07-27 LAB — CBC WITH DIFFERENTIAL/PLATELET
Abs Immature Granulocytes: 0.03 K/uL (ref 0.00–0.07)
Basophils Absolute: 0.1 K/uL (ref 0.0–0.1)
Basophils Relative: 1 %
Eosinophils Absolute: 0.9 K/uL — ABNORMAL HIGH (ref 0.0–0.5)
Eosinophils Relative: 12 %
HCT: 31.5 % — ABNORMAL LOW (ref 36.0–46.0)
Hemoglobin: 10 g/dL — ABNORMAL LOW (ref 12.0–15.0)
Immature Granulocytes: 0 %
Lymphocytes Relative: 43 %
Lymphs Abs: 3.2 K/uL (ref 0.7–4.0)
MCH: 28.2 pg (ref 26.0–34.0)
MCHC: 31.7 g/dL (ref 30.0–36.0)
MCV: 89 fL (ref 80.0–100.0)
Monocytes Absolute: 0.7 K/uL (ref 0.1–1.0)
Monocytes Relative: 10 %
Neutro Abs: 2.5 K/uL (ref 1.7–7.7)
Neutrophils Relative %: 34 %
Platelets: 137 K/uL — ABNORMAL LOW (ref 150–400)
RBC: 3.54 MIL/uL — ABNORMAL LOW (ref 3.87–5.11)
RDW: 14.8 % (ref 11.5–15.5)
WBC: 7.3 K/uL (ref 4.0–10.5)
nRBC: 0 % (ref 0.0–0.2)

## 2024-07-27 LAB — CK: Total CK: 54 U/L (ref 38–234)

## 2024-07-27 LAB — TROPONIN I (HIGH SENSITIVITY)
Troponin I (High Sensitivity): 7 ng/L (ref <18)
Troponin I (High Sensitivity): 7 ng/L (ref <18)

## 2024-07-27 MED ORDER — IOHEXOL 350 MG/ML SOLN
75.0000 mL | Freq: Once | INTRAVENOUS | Status: AC | PRN
  Administered 2024-07-27: 75 mL via INTRAVENOUS

## 2024-07-27 MED ORDER — VENLAFAXINE HCL ER 75 MG PO CP24: 75.0000 mg | ORAL_CAPSULE | Freq: Every day | ORAL | 10 refills | Status: AC

## 2024-07-27 NOTE — ED Provider Notes (Signed)
 [facility] Provider Note    Event Date/Time   First MD Initiated Contact with Patient 07/27/24 [phone]     (approximate)   History   Leg Swelling   HPI  Alison [name] is a 48 y.o. female with history of bipolar disorder, anxiety, hypertension, diabetes, lupus with history of lipoid hepatitis status post liver transplant in 1993, chronic kidney disease, sleep apnea who presents to the emergency department with complaints of feeling short of breath for the past 2 months.  No chest pain.  No fever, cough.  Also reports lower extremity swelling in the left leg for the past month.  Has had ultrasound at the beginning of the month that showed no DVT but she feels like the swelling is getting worse.  She does report twisting her ankle a few weeks ago and thinks this may be contributing as well.  She denies history of PE or DVT.  States that she was told at [facility] that she has possible myositis and is wondering if this is causing her leg swelling.   History provided by patient, family.    Past Medical History:  Diagnosis Date   Abnormal uterine bleeding    Allergy    Anxiety    Arthritis    Bipolar disorder (manic depression) (HCC)    Cancer (HCC) Diffuse B-Cell Lymphoma   2015   Chest pain    a. 09/2021 MV: Low risk without ischemia or infarct.  Normal EF.   Chronic kidney failure    Chronic renal disease, stage III (HCC)    Diabetes mellitus without complication (HCC)    DLBCL (diffuse large B cell lymphoma) (HCC) 2015   Right axillary lymph node resected and chemo tx's.   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 02/2023 Echo: EF 55-60%, no rwma, nl RV size/fxn. Mildly dil LA.   Personality disorder (HCC)    Pineal gland cyst    Post herpetic neuralgia    PTSD (post-traumatic stress disorder)    RBBB    Renal disorder    S/P liver transplant (HCC) 1993   Sleep apnea    has not recieved cpap yet-other one was recalled    Past Surgical History:  Procedure Laterality Date   ARTERY BIOPSY Right 04/14/2024   Procedure: BIOPSY TEMPORAL ARTERY;  Surgeon: Marea Selinda RAMAN, MD;  Location: [facility] ORS;  Service: General;  Laterality: Right;   BONE MARROW BIOPSY  01/13/2015   BREAST BIOPSY Right 12/2014   US  Axilla Bx, Positive for Lymphoma   BREAST BIOPSY Right 2011   benign   BREAST BIOPSY Right 2023   Stereo bx, X-clip, Fat Necrosis   BREAST BIOPSY Right 01/20/2024   US  RT BREAST BX W LOC DEV 1ST LESION IMG BX SPEC US  GUIDE 01/20/2024 [facility]   BREAST SURGERY Right 2016   Lymphoma, lymph node removal. pt then got a seroma and had to  do more surgery   CHOLECYSTECTOMY     COLONOSCOPY     COLONOSCOPY WITH PROPOFOL  N/A 02/28/2022   Procedure: COLONOSCOPY WITH PROPOFOL ;  Surgeon: Jinny Carmine, MD;  Location: [facility] ENDOSCOPY;  Service: Endoscopy;  Laterality: N/A;   ESOPHAGOGASTRODUODENOSCOPY     ESOPHAGOGASTRODUODENOSCOPY (EGD) WITH PROPOFOL  N/A 01/09/2021   Procedure: ESOPHAGOGASTRODUODENOSCOPY (EGD) WITH PROPOFOL ;  Surgeon: Jinny Carmine, MD;  Location: [facility] ENDOSCOPY;  Service: Endoscopy;  Laterality: N/A;   HERNIA REPAIR     x4-all from liver transplant   HYSTEROSCOPY WITH D & C N/A 11/13/2021   Procedure: DILATATION AND CURETTAGE /HYSTEROSCOPY;  Surgeon: Victor Claudell SAUNDERS, MD;  Location: [facility] ORS;  Service: Gynecology;  Laterality: N/A;   infusaport     LIVER TRANSPLANT  12/17/1991   LUMBAR PUNCTURE     PORT A CATH INJECTION ([facility] HX)     RENAL BIOPSY     tumor removal  2015   cancer right side    MEDICATIONS:  Prior to  Admission medications   Medication Sig Start Date End Date Taking? Authorizing Provider  Accu-Chek Softclix Lancets lancets 2 (two) times daily. 06/01/23   [provider]  albuterol  (VENTOLIN  HFA) 108 (90 Base) MCG/ACT inhaler Inhale 2 puffs into the lungs every 6 (six) hours as needed for wheezing or shortness of breath. 09/08/23   Pender, Julie F, FNP  baclofen  (LIORESAL ) 10 MG tablet Take 1 tablet by mouth 2 (two) times daily. 08/15/21   [provider]  benztropine  (COGENTIN ) 1 MG tablet TAKE 1 TABLET BY MOUTH ONCE DAILY AS NEEDED FOR TREMORS 08/01/23   [name], [name], MD  cephALEXin  (KEFLEX ) 500 MG capsule Take 1 capsule (500 mg total) by mouth 2 (two) times daily. Patient not taking: Reported on 07/26/2024 07/21/24   Gareth Mliss FALCON, FNP  Continuous Glucose Sensor (FREESTYLE LIBRE 3 PLUS SENSOR) MISC CHANGE SENSOR EVERY 15 DAYS 04/12/24   Dartha Ernst, MD  fluticasone  (FLONASE ) 50 MCG/ACT nasal spray Place into the nose. 10/30/21   [provider]  glipiZIDE  (GLUCOTROL  XL) 10 MG 24 hr tablet TAKE 1 TABLET BY MOUTH IN THE MORNING AND AT BEDTIME 03/30/24   Motwani, Komal, MD  glucose blood (RELION GLUCOSE TEST STRIPS) test strip Use as instructed 04/19/24   Gareth Mliss FALCON, FNP  insulin  glargine (LANTUS  SOLOSTAR) 100 UNIT/ML Solostar Pen Inject 40 Units into the skin 2 (two) times daily. Patient taking differently: Inject 40 Units into the skin daily. Taking 40 units at night before bed 04/16/24   Fausto Sor A, DO  lisinopril  (ZESTRIL ) 20 MG tablet TAKE 1 TABLET BY MOUTH ONCE DAILY *PATIENT WILL NEED TO SCHEDULE AN APPOINTMENT FOR ADDITIONAL REFILLS* Patient taking differently: Take 20 mg by mouth daily. Not paused 02/27/24   Pender, Julie F, FNP  lurasidone  (LATUDA ) 20 MG TABS tablet Take 1 tablet (20 mg total) by mouth daily with supper. 06/04/24 12/01/24  [name], [name], MD  magnesium  oxide (MAG-OX) 400 MG tablet Take 1,000 mg by mouth 2 (two) times daily.     [provider]  metFORMIN  (GLUCOPHAGE ) 1000 MG tablet Take 1 tablet (1,000 mg total) by mouth 2 (two) times daily with a meal. 06/25/24   Motwani, Ernst, MD  mirabegron  ER (MYRBETRIQ ) 50 MG TB24 tablet Take 1 tablet (50 mg total) by mouth daily. 01/22/23   Gareth Mliss FALCON, FNP  mirtazapine  (REMERON ) 15 MG tablet TAKE 1 TABLET BY MOUTH AT BEDTIME 05/03/24   [name], [name], MD  naloxone  (NARCAN ) nasal spray 4 mg/0.1  mL Place 0.4 mg into the nose once. 09/05/21   [provider]  NOVOLOG  FLEXPEN 100 UNIT/ML FlexPen Inject 40 Units into the skin 3 (three) times daily with meals. Hold if not eating at least half of a full meal. Patient taking differently: Inject 9 Units into the skin 3 (three) times daily with meals. Add additional unit depending on what glucose is 04/16/24   Fausto Sor A, DO  ondansetron  (ZOFRAN ) 4 MG tablet Take 1 tablet (4 mg total) by mouth every 6 (six) hours as needed for nausea. 10/16/23   Amin, Sumayya, MD  oxyCODONE  (OXY IR/ROXICODONE ) 5 MG immediate release tablet Take 5 mg by mouth 3 (three) times daily as needed.    [provider]  pantoprazole  (PROTONIX ) 40 MG tablet Take 1 tablet (40 mg total) by mouth daily. Recommend using while on Prednisone . 04/17/24   Fausto Sor LABOR, DO  PRECISION QID TEST test strip  08/06/19   [provider]  predniSONE  (DELTASONE ) 10 MG tablet Take 10 mg by mouth daily with breakfast. Take with 10 mg tablet to equal 30 mg by mouth daily Patient not taking: Reported on 07/26/2024 05/17/24   [provider]  pregabalin  (LYRICA ) 200 MG capsule Take 1 capsule (200 mg total) by mouth 2 (two) times daily. 12/03/21   Floy Roberts, MD  rosuvastatin  (CRESTOR ) 10 MG tablet TAKE 1 TABLET BY MOUTH ONCE DAILY 05/31/24   Haley, Sheng L, PA-C  Semaglutide , 2 MG/DOSE, 8 MG/3ML SOPN Inject 2 mg as directed once a week. 04/21/24   Motwani, Komal, MD  SURE COMFORT PEN NEEDLES 32G X 6 MM MISC USE AS DIRECTED 05/03/24    Pender, Julie F, FNP  tacrolimus  (PROGRAF ) 1 MG capsule Take 1 capsule by mouth every morning. 02/20/23   [provider]  UBRELVY  50 MG TABS Take 1 tablet by mouth daily as needed. 01/19/22   [provider]  venlafaxine  XR (EFFEXOR -XR) 37.5 MG 24 hr capsule Take 1 capsule (37.5 mg total) by mouth every morning. Take along with 75 mg daily 05/18/24   [name], [name], MD  venlafaxine  XR (EFFEXOR -XR) 75 MG 24 hr capsule Take 1 capsule (75 mg total) by mouth daily with breakfast. Take along with 37.5 mg daily 09/09/23   [name], [name], MD    Physical Exam   Triage Vital Signs: ED Triage Vitals  Encounter Vitals Group     BP 07/27/24 0336 (!) 163/102     Girls Systolic BP Percentile --      Girls Diastolic BP Percentile --      Boys Systolic BP Percentile --      Boys Diastolic BP Percentile --      Pulse Rate 07/27/24 0336 (!) 102     Resp 07/27/24 0336 18     Temp 07/27/24 0336 98.5 F (36.9 C)     Temp Source 07/27/24 0336 Oral     SpO2 07/27/24 0336 100 %     Weight 07/27/24 0337 250 lb (113.4 kg)     Height 07/27/24 0337 5' 6 (1.676 m)     Head Circumference --      Peak Flow --      Pain Score 07/27/24 0337 3     Pain Loc --      Pain Education --      Exclude from Growth Chart --     Most recent vital signs: Vitals:   07/27/24 0336 07/27/24 0605  BP: (!) 163/102   Pulse: ROLLEN)  102 85  Resp: 18 19  Temp: 98.5 F (36.9 C)   SpO2: 100% 98%    CONSTITUTIONAL: Alert, responds appropriately to questions.  Chronically ill-appearing HEAD: Normocephalic, atraumatic EYES: Conjunctivae clear, pupils appear equal, sclera nonicteric ENT: normal nose; moist mucous membranes NECK: Supple, normal ROM CARD: Regular and tachycardic; S1 and S2 appreciated RESP: Normal chest excursion without splinting or tachypnea; breath sounds clear and equal bilaterally; no wheezes, no rhonchi, no rales, no hypoxia or respiratory distress, speaking full sentences ABD/GI:  Non-distended; soft, non-tender, no rebound, no guarding, no peritoneal signs BACK: The back appears normal EXT: Patient does have increased swelling in the left calf, ankle and foot compared to the right side.  There is no significant tenderness palpation and no increased redness or warmth.  She has a 2+ left DP pulse.  No joint effusion.  Compartments soft.  Normal capillary refill. SKIN: Normal color for age and race; warm; no rash on exposed skin NEURO: Moves all extremities equally, normal speech PSYCH: The patient's mood and manner are appropriate.   ED Results / Procedures / Treatments   LABS: (all labs ordered are listed, but only abnormal results are displayed) Labs Reviewed  CBC WITH DIFFERENTIAL/PLATELET - Abnormal; Notable for the following components:      Result Value   RBC 3.54 (*)    Hemoglobin 10.0 (*)    HCT 31.5 (*)    Platelets 137 (*)    Eosinophils Absolute 0.9 (*)    All other components within normal limits  BASIC METABOLIC PANEL WITH GFR - Abnormal; Notable for the following components:   CO2 19 (*)    Glucose, Bld 159 (*)    Calcium  8.6 (*)    Anion gap 17 (*)    All other components within normal limits  CK  TROPONIN I (HIGH SENSITIVITY)     EKG:  EKG Interpretation Date/Time:  Tuesday July 27 2024 04:31:09 EDT Ventricular Rate:  89 PR Interval:  170 QRS Duration:  161 QT Interval:  419 QTC Calculation: 510 R Axis:   -27  Text Interpretation: Sinus rhythm IVCD, consider atypical RBBB Confirmed by Neomi Neptune [phone]) on 07/27/2024 4:53:09 AM         RADIOLOGY: My personal review and interpretation of imaging: Venous Doppler shows no DVT but does show a Baker's cyst.  Left ankle x-ray shows unchanged appearance of avulsion fracture at the medial malleolus.  I have personally reviewed all radiology reports.   US  Venous Img Lower Unilateral Left Result Date: 07/27/2024 EXAM: ULTRASOUND DUPLEX OF THE LEFT LOWER EXTREMITY VEINS  TECHNIQUE: Duplex ultrasound using B-mode/gray scaled imaging and Doppler spectral analysis and color flow was obtained of the deep venous structures of the left lower extremity. COMPARISON: 07/08/2024 CLINICAL HISTORY: 48 year old female with worsening left lower extremity swelling. FINDINGS: The common femoral vein, femoral vein, popliteal vein, and visible calf veins of the left lower extremity demonstrate normal compressibility with normal color flow and spectral analysis. New crescent shaped nonvascular fluid collection identified in the popliteal fossa encompassing 32 x 48 x 14 mm, compatible with a Baker cyst. IMPRESSION: 1. No evidence of left lower extremity DVT. 2. 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 Electronically signed by: Helayne Hurst MD 07/27/2024 06:49 AM EDT RP Workstation: [id number]   DG Ankle Complete Left Result Date: 07/27/2024 EXAM: 3 OR MORE VIEW(S) XRAY OF THE LEFT ANKLE 07/27/2024 04:49:11 AM CLINICAL HISTORY: worsening LLE swelling. Worsening LLE swelling COMPARISON:  06/04/2024 FINDINGS: BONES AND JOINTS: Unchanged tiny avulsion fracture at medial malleolus. Small superior and plantar calcaneal spurs. No focal osseous lesion. No joint dislocation. SOFT TISSUES: Soft tissue swelling. IMPRESSION: 1. Soft tissue swelling 2. Unchanged appearance of suspected avulsion fracture of the medial malleolus as described on 06/04/2024 Electronically signed by: Waddell Calk MD 07/27/2024 05:43 AM EDT RP Workstation: [id number]     PROCEDURES:  Critical Care performed: No   CRITICAL CARE   .1-3 Lead EKG Interpretation  Performed by: Samiah Ricklefs, Josette SAILOR, DO Authorized by: Casimiro Lienhard, Josette SAILOR, DO     Interpretation: abnormal     ECG rate:  102   ECG rate assessment: tachycardic     Rhythm: sinus tachycardia     Ectopy: none     Conduction: normal       IMPRESSION / MDM / ASSESSMENT AND PLAN / ED COURSE  I reviewed the triage vital signs and the nursing  notes.    Patient here with shortness of breath, left leg swelling.  History of lupus.  The patient is on the cardiac monitor to evaluate for evidence of arrhythmia and/or significant heart rate changes.   DIFFERENTIAL DIAGNOSIS (includes but not limited to):   PE, DVT, no signs of developing cellulitis or compartment syndrome, no signs of gout or septic arthritis, swelling could be related to recent ankle sprain, doubt fracture   Patient's presentation is most consistent with acute presentation with potential threat to life or bodily function.   PLAN: Will obtain labs, CTA of the chest, repeat ultrasound of the left leg as patient is high risk for clot given her history of lupus.  She states that she has been told by rheumatology at [facility] that she could have myositis.  Will check CK level.  No signs of arterial obstruction, cellulitis, septic arthritis, gout, compartment syndrome on exam.   MEDICATIONS GIVEN IN ED: Medications  iohexol  (OMNIPAQUE ) 350 MG/ML injection 75 mL (75 mLs Intravenous Contrast Given 07/27/24 0659)     ED COURSE: Labs show hemoglobin of 10.  Chronic thrombocytopenia.  She does have a very minimally elevated elevated anion gap metabolic acidosis but blood glucose is 159.  She is not having vomiting, abdominal pain.  Low suspicion for DKA.  She is not on SGLT2 inhibitor that would cause euglycemic DKA.  X-ray of the ankle redemonstrates avulsion fracture of the medial malleolus which is likely the cause of pain and swelling in her foot and ankle.  Will place in an Ace wrap and have her follow-up with orthopedics.  Ultrasound reviewed and interpreted by myself and the radiologist and shows no DVT but does show a Baker's cyst likely the cause of her pain and swelling in the posterior knee.  Labs show normal CK level.  Doubt myositis.  Troponin negative.  CTA of the chest pending.  Signed out to oncoming ED physician at 7:24 AM.   CONSULTS: If CTA negative,  anticipate discharge home with outpatient follow-up.   OUTSIDE RECORDS REVIEWED: Reviewed recent rheumatology notes at [facility].       FINAL CLINICAL IMPRESSION(S) / ED DIAGNOSES   Final diagnoses:  Left leg swelling  Shortness of breath  Baker's cyst of knee, left  Avulsion fracture of left ankle, sequela     Rx / DC Orders   ED Discharge Orders          Ordered    Apply wrap        07/27/24 0727  Note:  This document was prepared using Dragon voice recognition software and may include unintentional dictation errors.   Danuel Felicetti, Josette SAILOR, DO 07/27/24 [phone]

## 2024-07-27 NOTE — ED Notes (Signed)
 Ace wrap applied to left ankle.

## 2024-07-27 NOTE — ED Triage Notes (Signed)
 Pt st since Wed having swelling to left leg; was seen by her PCP with negative findings but symptoms persist; no redness noted with palpation; skin W&D, +PP

## 2024-07-27 NOTE — Progress Notes (Signed)
 Virtual Visit via Video Note  I connected with Alison [name] on 07/27/24 at 11:20 AM EDT by a video enabled telemedicine application and verified that I am speaking with the correct person using two identifiers.  Location Provider Location : [facility] Patient Location : Home  Participants: Patient , Provider   I discussed the limitations of evaluation and management by telemedicine and the availability of in person appointments. The patient expressed understanding and agreed to proceed.   I discussed the assessment and treatment plan with the patient. The patient was provided an opportunity to ask questions and all were answered. The patient agreed with the plan and demonstrated an understanding of the instructions.   The patient was advised to call back or seek an in-person evaluation if the symptoms worsen or if the condition fails to improve as anticipated.   BH MD OP Progress Note  07/27/2024 12:46 PM Alison [name]  MRN:  [id number]  Chief Complaint:  Chief Complaint  Patient presents with   Follow-up   Depression   Anxiety   Medication Refill   Discussed the use of AI scribe software for clinical note transcription with the patient, who gave verbal consent to proceed.  History of Present Illness Alison [name] is a 48 year old Caucasian female, lives in [place], single, on disability, has a history of bipolar disorder, PTSD, medical problems including GI issues, history of liver transplant, B-cell lymphoma, SLE, diabetes mellitus, stage IV chronic kidney disease, OSA currently not compliant with CPAP, chronic migraine headaches, hyperlipidemia, hypertension was evaluated by telemedicine today.  She reports stable mood and feels she is doing well overall. She reports improved sleep, with earlier bedtime between 10 and 11 PM and waking around 8 AM. She states she has regained her rhythm. She denies any current concerns with mood or anxiety and does not request changes  to her medications at this time.  Her current medications include venlafaxine  112.5 mg, mirtazapine  15 mg, and Latuda  20 mg daily. She also uses benztropine  1 mg as needed, typically 1 or 2 times per week for tremors. She reports not using her CPAP mask due to facial irritation  She was recently seen at the emergency department for leg swelling, was medically cleared and discharged.  She has an emotional support Advertising account planner. She plans to attend several Halloween events and participate in costume contests.    Visit Diagnosis:    ICD-10-CM   1. Bipolar 1 disorder, depressed, full remission  F31.76     2. PTSD (post-traumatic stress disorder)  F43.10 venlafaxine  XR (EFFEXOR -XR) 75 MG 24 hr capsule    3. GAD (generalized anxiety disorder)  F41.1 venlafaxine  XR (EFFEXOR -XR) 75 MG 24 hr capsule    4. Insomnia due to medical condition  G47.01    Pain, obstructive sleep apnea noncompliant on CPAP      Past Psychiatric History: I have reviewed past psychiatric history from progress note on 06/30/2019.  Past Medical History:  Past Medical History:  Diagnosis Date   Abnormal uterine bleeding    Allergy    Anxiety    Arthritis    Bipolar disorder (manic depression) (HCC)    Cancer (HCC) Diffuse B-Cell Lymphoma   2015   Chest pain    a. 09/2021 MV: Low risk without ischemia or infarct.  Normal EF.   Chronic kidney failure    Chronic renal disease, stage III (HCC)    Diabetes mellitus without complication (HCC)    DLBCL (diffuse large B cell lymphoma) (  HCC) 2015   Right axillary lymph node resected and chemo tx's.   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 02/2023 Echo: EF 55-60%, no rwma, nl RV size/fxn. Mildly dil LA.   Personality disorder (HCC)    Pineal gland cyst    Post herpetic neuralgia    PTSD (post-traumatic stress disorder)    RBBB    Renal disorder    S/P liver transplant (HCC) 1993   Sleep apnea    has not recieved cpap yet-other one was recalled    Past Surgical History:  Procedure Laterality Date   ARTERY BIOPSY Right 04/14/2024   Procedure: BIOPSY TEMPORAL ARTERY;  Surgeon: Marea Selinda RAMAN, MD;  Location: [facility];  Service: General;  Laterality: Right;   BONE MARROW BIOPSY  01/13/2015   BREAST BIOPSY Right 12/2014   US  Axilla Bx, Positive for Lymphoma   BREAST BIOPSY Right 2011   benign   BREAST BIOPSY Right 2023   Stereo bx, X-clip, Fat Necrosis   BREAST BIOPSY Right 01/20/2024   US  RT BREAST BX W LOC DEV 1ST LESION IMG BX SPEC US  GUIDE 01/20/2024 [facility]   BREAST SURGERY Right 2016   Lymphoma, lymph node removal. pt then got a seroma and had to do more surgery   CHOLECYSTECTOMY     COLONOSCOPY     COLONOSCOPY WITH PROPOFOL  N/A 02/28/2022   Procedure: COLONOSCOPY WITH PROPOFOL ;  Surgeon: Jinny Carmine, MD;  Location: [facility];  Service: Endoscopy;  Laterality: N/A;   ESOPHAGOGASTRODUODENOSCOPY     ESOPHAGOGASTRODUODENOSCOPY (EGD) WITH PROPOFOL  N/A 01/09/2021   Procedure: ESOPHAGOGASTRODUODENOSCOPY (EGD) WITH PROPOFOL ;  Surgeon: Jinny Carmine, MD;  Location: [facility];  Service: Endoscopy;  Laterality: N/A;   HERNIA REPAIR     x4-all from liver transplant   HYSTEROSCOPY WITH D & C N/A 11/13/2021   Procedure: DILATATION AND CURETTAGE /HYSTEROSCOPY;  Surgeon: Victor Claudell SAUNDERS, MD;  Location: [facility];  Service: Gynecology;  Laterality: N/A;   infusaport     LIVER TRANSPLANT  12/17/1991   LUMBAR PUNCTURE     PORT A CATH INJECTION ([facility] H[facility])     RENAL BIOPSY     tumor removal  2015   cancer right side    Family  Psychiatric History: I have reviewed family psychiatric history from progress note on 06/30/2019.  Family History:  Family History  Problem Relation Age of Onset   Heart disease Mother    Hypertension Mother    Cancer - Other Mother    Bipolar disorder Mother    Anxiety disorder Mother    Arthritis Mother    Depression Mother    Heart disease Father    Hypertension Father    Diabetes Father    Parkinson's disease Maternal Grandmother    COPD Maternal Grandmother    Cancer Maternal Aunt    Cancer Maternal Uncle    Cancer Maternal Grandfather    Lupus Paternal Grandmother  Hypertension Brother    Alcohol abuse Brother    Depression Brother    Alcohol abuse Maternal Uncle     Social History: I have reviewed social history from progress note on 06/30/2019. Social History   Socioeconomic History   Marital status: Single    Spouse name: Not on file   Number of children: 0   Years of education: 9   Highest education level: GED or equivalent  Occupational History   Occupation: disabled  Tobacco Use   Smoking status: Never    Passive exposure: Past   Smokeless tobacco: Never   Tobacco comments:    Occasional CBD oil  Vaping Use   Vaping status: Never Used  Substance and Sexual Activity   Alcohol use: Not Currently   Drug use: Not Currently    Comment: pain managment last used in early april   Sexual activity: Not Currently  Other Topics Concern   Not on file  Social History Narrative   Not on file   Social Drivers of Health   Financial Resource Strain: Low Risk  (04/01/2024)   Overall Financial Resource Strain (CARDIA)    Difficulty of Paying Living Expenses: Not very hard  Food Insecurity: No Food Insecurity (07/26/2024)   Hunger Vital Sign    Worried About Running Out of Food in the Last Year: Never true    Ran Out of Food in the Last Year: Never true  Transportation Needs: No Transportation Needs (07/26/2024)   PRAPARE - Scientist, research (physical sciences) (Medical): No    Lack of Transportation (Non-Medical): No  Physical Activity: Inactive (04/01/2024)   Exercise Vital Sign    Days of Exercise per Week: 0 days    Minutes of Exercise per Session: 0 min  Stress: Stress Concern Present (04/01/2024)   [employer] of Occupational Health - Occupational Stress Questionnaire    Feeling of Stress: To some extent  Social Connections: Unknown (04/12/2024)   Social Connection and Isolation Panel    Frequency of Communication with Friends and Family: Patient declined    Frequency of Social Gatherings with Friends and Family: Patient declined    Attends Religious Services: Patient declined    Database administrator or Organizations: Patient declined    Attends Banker Meetings: Patient declined    Marital Status: Living with partner    Allergies:  Allergies  Allergen Reactions   Penicillin G Itching, Rash, Anaphylaxis and Palpitations   Lamictal  [Lamotrigine ] Rash   Tape    Carbamazepine Other (See Comments)    Medication interaction-prograf     Hydrocodone-Acetaminophen  Itching   Naproxen Itching   Penicillins    Vancomycin  Rash    Macular/blotchy rash at infusion site    Metabolic Disorder Labs: Lab Results  Component Value Date   HGBA1C 8.7 (H) 06/04/2024   MPG 203 06/04/2024   MPG 232 04/01/2024   Lab Results  Component Value Date   PROLACTIN 16.4 12/12/2022   PROLACTIN 13.4 07/15/2019   Lab Results  Component Value Date   CHOL 123 06/04/2024   TRIG 150 (H) 06/04/2024   HDL 44 06/04/2024   CHOLHDL 2.8 06/04/2024   VLDL 30 06/04/2024   LDLCALC 49 06/04/2024   LDLCALC 57 04/01/2024   Lab Results  Component Value Date   TSH 1.56 04/01/2024   TSH 4.878 (H) 03/09/2023    Therapeutic Level Labs: No results found for: LITHIUM No results found for: VALPROATE No results found for: CBMZ  Current Medications:  Current Outpatient Medications  Medication Sig Dispense Refill   Accu-Chek  Softclix Lancets lancets 2 (two) times daily.     albuterol  (VENTOLIN  HFA) 108 (90 Base) MCG/ACT inhaler Inhale 2 puffs into the lungs every 6 (six) hours as needed for wheezing or shortness of breath. 8 g 0   baclofen  (LIORESAL ) 10 MG tablet Take 1 tablet by mouth 2 (two) times daily.     benztropine  (COGENTIN ) 1 MG tablet TAKE 1 TABLET BY MOUTH ONCE DAILY AS NEEDED FOR TREMORS 30 tablet 10   cephALEXin  (KEFLEX ) 500 MG capsule Take 1 capsule (500 mg total) by mouth 2 (two) times daily. (Patient not taking: Reported on 07/26/2024) 10 capsule 0   Continuous Glucose Sensor (FREESTYLE LIBRE 3 PLUS SENSOR) MISC CHANGE SENSOR EVERY 15 DAYS 6 each 3   fluticasone  (FLONASE ) 50 MCG/ACT nasal spray Place into the nose.     glipiZIDE  (GLUCOTROL  XL) 10 MG 24 hr tablet TAKE 1 TABLET BY MOUTH IN THE MORNING AND AT BEDTIME 60 tablet 5   glucose blood (RELION GLUCOSE TEST STRIPS) test strip Use as instructed 100 each 12   insulin  glargine (LANTUS  SOLOSTAR) 100 UNIT/ML Solostar Pen Inject 40 Units into the skin 2 (two) times daily. (Patient taking differently: Inject 40 Units into the skin daily. Taking 40 units at night before bed) 15 mL 0   lisinopril  (ZESTRIL ) 20 MG tablet TAKE 1 TABLET BY MOUTH ONCE DAILY *PATIENT WILL NEED TO SCHEDULE AN APPOINTMENT FOR ADDITIONAL REFILLS* (Patient taking differently: Take 20 mg by mouth daily. Not paused) 90 tablet 0   lurasidone  (LATUDA ) 20 MG TABS tablet Take 1 tablet (20 mg total) by mouth daily with supper. 90 tablet 1   magnesium  oxide (MAG-OX) 400 MG tablet Take 1,000 mg by mouth 2 (two) times daily.     metFORMIN  (GLUCOPHAGE ) 1000 MG tablet Take 1 tablet (1,000 mg total) by mouth 2 (two) times daily with a meal. 180 tablet 0   mirabegron  ER (MYRBETRIQ ) 50 MG TB24 tablet Take 1 tablet (50 mg total) by mouth daily. 90 tablet 3   mirtazapine  (REMERON ) 15 MG tablet TAKE 1 TABLET BY MOUTH AT BEDTIME 90 tablet 11   naloxone  (NARCAN ) nasal spray 4 mg/0.1 mL Place 0.4 mg into  the nose once.     NOVOLOG  FLEXPEN 100 UNIT/ML FlexPen Inject 40 Units into the skin 3 (three) times daily with meals. Hold if not eating at least half of a full meal. (Patient taking differently: Inject 9 Units into the skin 3 (three) times daily with meals. Add additional unit depending on what glucose is) 15 mL 11   ondansetron  (ZOFRAN ) 4 MG tablet Take 1 tablet (4 mg total) by mouth every 6 (six) hours as needed for nausea. 20 tablet 0   oxyCODONE  (OXY IR/ROXICODONE ) 5 MG immediate release tablet Take 5 mg by mouth 3 (three) times daily as needed.     pantoprazole  (PROTONIX ) 40 MG tablet Take 1 tablet (40 mg total) by mouth daily. Recommend using while on Prednisone . 30 tablet 0   PRECISION QID TEST test strip      predniSONE  (DELTASONE ) 10 MG tablet Take 10 mg by mouth daily with breakfast. Take with 10 mg tablet to equal 30 mg by mouth daily (Patient not taking: Reported on 07/26/2024)     pregabalin  (LYRICA ) 200 MG capsule Take 1 capsule (200 mg total) by mouth 2 (two) times daily. 60 capsule 0   rosuvastatin  (CRESTOR ) 10 MG tablet TAKE 1 TABLET BY  MOUTH ONCE DAILY 90 tablet 3   Semaglutide , 2 MG/DOSE, 8 MG/3ML SOPN Inject 2 mg as directed once a week. 9 mL 1   SURE COMFORT PEN NEEDLES 32G X 6 MM MISC USE AS DIRECTED 100 each 11   tacrolimus  (PROGRAF ) 1 MG capsule Take 1 capsule by mouth every morning.     UBRELVY  50 MG TABS Take 1 tablet by mouth daily as needed.     venlafaxine  XR (EFFEXOR -XR) 37.5 MG 24 hr capsule Take 1 capsule (37.5 mg total) by mouth every morning. Take along with 75 mg daily 30 capsule 5   venlafaxine  XR (EFFEXOR -XR) 75 MG 24 hr capsule Take 1 capsule (75 mg total) by mouth daily with breakfast. Take along with 37.5 mg daily 30 capsule 10   No current facility-administered medications for this visit.     Musculoskeletal: Strength & Muscle Tone: UTA Gait & Station: Seated Patient leans: N/A  Psychiatric Specialty Exam: Review of Systems   Psychiatric/Behavioral: Negative.      Last menstrual period 10/16/2021.There is no height or weight on file to calculate BMI.  General Appearance: Casual  Eye Contact:  Fair  Speech:  Clear and Coherent  Volume:  Normal  Mood:  Euthymic  Affect:  Congruent  Thought Process:  Goal Directed and Descriptions of Associations: Intact  Orientation:  Full (Time, Place, and Person)  Thought Content: Logical   Suicidal Thoughts:  No  Homicidal Thoughts:  No  Memory:  Immediate;   Fair Recent;   Fair Remote;   Fair  Judgement:  Fair  Insight:  Fair  Psychomotor Activity:  Normal  Concentration:  Concentration: Fair and Attention Span: Fair  Recall:  Alison [name] of Knowledge: Fair  Language: Fair  Akathisia:  No  Handed:  Right  AIMS (if indicated): not done  Assets:  Manufacturing systems engineer Desire for Improvement Housing Social Support Transportation  ADL's:  Intact  Cognition: WNL  Sleep:  Fair   Screenings: Midwife Visit from 04/28/2024 in [facility] Office Visit from 11/04/2023 in [facility] Office Visit from 07/21/2023 in [facility] Office Visit from 12/12/2022 in [facility] Office Visit from 09/12/2022 in [facility]  AIMS Total Score 0 0 0 0 0   GAD-7    Flowsheet Row Office Visit from 07/08/2024 in [facility] Office Visit from 04/28/2024 in [facility] Office Visit from 04/01/2024 in [facility] Office Visit from 01/05/2024 in [facility] Office Visit from 07/21/2023 in [facility]  Total GAD-7 Score 5 3 14  0 8   PHQ2-9    Flowsheet Row Patient Outreach Telephone from 07/26/2024 in [place]  [facility]PART[facility] Most recent reading at 07/26/2024 11:20 AM Office Visit from 07/08/2024 in [facility] Most recent reading at 07/08/2024 11:04 AM Patient Outreach Telephone from 07/08/2024 in [place] [facility] Most recent reading at 07/08/2024  9:26 AM Patient Outreach Telephone from 06/04/2024 in [place] [facility] Most recent reading at 06/04/2024  1:58 PM Office Visit from 04/28/2024 in [facility] Most recent reading at 04/28/2024  1:49 PM  PHQ-2 Total Score 2 2 2 2 2   PHQ-9 Total Score 7 8 6 5 9    Flowsheet  Row Video Visit from 07/27/2024 in [facility] Most recent reading at 07/27/2024 12:46 PM ED from 07/27/2024 in [facility] Emergency Department at [facility] Most recent reading at 07/27/2024  3:49 AM ED to Hosp-Admission (Discharged) from 06/04/2024 in [facility] 1C MEDICAL TELEMETRY Most recent reading at 06/04/2024  3:10 PM  C-SSRS RISK CATEGORY No Risk No Risk No Risk     Assessment and Plan: Alison [name] is a 48 year old Caucasian female who presented for a follow-up appointment.  Discussed assessment and plan as noted below.  1. Bipolar 1 disorder, depressed, full remission Currently reports mood symptoms are stable on the current medication regimen. Continue Latuda  20 mg daily Continue Benztropine  1 mg as needed for side effects of Latuda   2. PTSD (post-traumatic stress disorder)-stable Denies any intrusive memories flashbacks or nightmares. Continue Venlafaxine  112.5 mg daily Continue Mirtazapine  15 mg at bedtime Continue psychotherapy sessions with Ms. Allison  3. GAD (generalized anxiety disorder)-stable Currently reports overall anxiety symptoms as stable.  Compliant on medications as prescribed and denies side effects. Continue Venlafaxine  112.5 mg daily Continue  psychotherapy sessions  4. Insomnia due to medical condition-stable Currently reports sleep has improved and gets around 8 hours of sleep.  Does have obstructive sleep apnea and continues to not tolerate CPAP mask.  Hence noncompliant. Continue Mirtazapine  15 mg at bedtime  Most recent EKG reviewed dated 07/26/2024-QTc-510, sinus rhythm this was reviewed by emergency department physician as well as cardiology yesterday when she was in the emergency department and as per review of notes, EKG is unchanged.  She was cleared and released from the emergency department.  Follow-up Follow-up in clinic in 2 to 3 months or sooner in person  Collaboration of Care: Collaboration of Care: Referral or follow-up with counselor/therapist AEB encouraged to continue psychotherapy sessions as well as to continue follow-up with cardiology  Patient/Guardian was advised Release of Information must be obtained prior to any record release in order to collaborate their care with an outside provider. Patient/Guardian was advised if they have not already done so to contact the registration department to sign all necessary forms in order for us  to release information regarding their care.   Consent: Patient/Guardian gives verbal consent for treatment and assignment of benefits for services provided during this visit. Patient/Guardian expressed understanding and agreed to proceed.   This note was generated in part or whole with voice recognition software. Voice recognition is usually quite accurate but there are transcription errors that can and very often do occur. I apologize for any typographical errors that were not detected and corrected.    Epsie Walthall, MD 07/27/2024, 12:46 PM

## 2024-07-27 NOTE — ED Provider Notes (Signed)
 7:42 AM Assumed care for off going team.   Blood pressure (!) 145/78, pulse 79, temperature 98.4 F (36.9 C), resp. rate (!) 26, height 5' 6 (1.676 m), weight 113.4 kg, last menstrual period 10/16/2021, SpO2 96%.  See their HPI for full report but in brief pending Ctangio and if negative DC home.  IMPRESSION: 1. No pulmonary embolism. 2. Low lung volumes with dependent changes in the lung bases. 3. Lad coronary artery calcifications.  Discharge instructions were provided by the initial doctor however I did add on the report of the concern for coronary artery calcification and patient reports that she sees Dr. Budd kindle she call them today to make a follow-up appointment to discuss further workup for this calcification.  We discussed that we cannot predict how narrow it is and if she would need an intervention such as stenting this is what a cardiologist can do. EKG is unchanged from prior. She denies any chest pain. Denies any history of stents She reports about a month of some mild shortness of breath.  She denies any at rest. Denies it changing it character. She understands I cannot predict future heart attacks so if she develops any chest pain she should return to the ER.  We also discussed that if her shortness of breath changes and she develops shortness of breath at rest or worsening shortness of breath is not relieving that she would need to return to the ER as well for repeat troponins.  Her repeat troponin here is negative.   I discussed with Dr. Mady from cardiology who did review some of her prior CT imaging and did state that the calcification did look like it has actually been there previously.  If troponins are negative wouldn't need admission for inpt stress test sorely based on calfication alone as this can be non specific. Given this is been some ongoing shortness of breath and is not even clear if it is could be related to this given this has been on CT imaging previously I  discussed with patient admission to the hospital versus going home.  We discussed the pros and cons but patient felt comfortable with outpatient follow-up with cardiology given she denied any change in her symptoms over the course of a month and her EKG and troponins are negative today.  She feels comfortable with outpatient follow-up.  She understands however that if symptoms are changing or worsening that she would need to return to the ER for repeat evaluation.   Ernest Ronal BRAVO, MD 07/27/24 0900

## 2024-07-27 NOTE — Discharge Instructions (Addendum)
 You may alternate over the counter Tylenol  1000 mg every 6 hours as needed for pain, fever and Ibuprofen  800 mg every 6-8 hours as needed for pain, fever.  Please take Ibuprofen  with food.  Do not take more than 4000 mg of Tylenol  (acetaminophen ) in a 24 hour period.   Please follow-up with orthopedics as needed if you have continued pain, swelling.  You may weight-bear as tolerated on this leg.  You may use an Ace wrap, compression hose to help with pain and swelling.  Keeping this leg elevated at rest and applying ice can also help with pain and swelling.  Your ultrasound showed no blood clot in your left leg.  IMPRESSION: 1. No pulmonary embolism. 2. Low lung volumes with dependent changes in the lung bases. 3. Lad coronary artery calcifications.  Please call cardiology to make a follow-up appointment in the next 1 to 2 weeks to discuss your CT findings and discuss if further workup is needed for this.

## 2024-07-28 ENCOUNTER — Telehealth: Payer: Self-pay

## 2024-07-28 ENCOUNTER — Telehealth: Payer: Self-pay | Admitting: "Endocrinology

## 2024-07-28 ENCOUNTER — Other Ambulatory Visit (HOSPITAL_COMMUNITY): Payer: Self-pay

## 2024-07-28 ENCOUNTER — Ambulatory Visit: Admitting: Physical Therapy

## 2024-07-28 ENCOUNTER — Encounter

## 2024-07-28 DIAGNOSIS — E119 Type 2 diabetes mellitus without complications: Secondary | ICD-10-CM

## 2024-07-28 MED ORDER — RELION GLUCOSE TEST STRIPS VI STRP: ORAL_STRIP | 12 refills | Status: DC

## 2024-07-28 NOTE — Telephone Encounter (Signed)
 Pharmacy Patient Advocate Encounter   Received notification from CoverMyMeds that prior authorization for Freestyle libre 3 plus sensor is required/requested.   Insurance verification completed.   The patient is insured through [employer].   Per test claim: PA required and submitted KEY/EOC/Request #: [id number] from 07/28/2024 to 01/24/2025 PA Case: [id number]

## 2024-07-28 NOTE — Telephone Encounter (Signed)
 Requested Prescriptions   Signed Prescriptions Disp Refills   glucose blood (RELION GLUCOSE TEST STRIPS) test strip 300 each 12    Sig: Use to check blood sugar 3 times a day.    Authorizing Provider: DARTHA ERNST    Ordering User: CLEOTILDE ROLIN RAMAN

## 2024-07-28 NOTE — Telephone Encounter (Signed)
 MEDICATION: Relion glucose test strips  PHARMACY:  [facility] [address] [place] - [address] [place] [address] Phone: [phone]  Fax: [phone]   HAS THE PATIENT CONTACTED THEIR PHARMACY?  Yes  LAST REFILL:  @@LASTREFILL @  IS THIS A 90 DAY SUPPLY : Yes  IS PATIENT OUT OF MEDICATION: Almost  IF NOT; HOW MUCH IS LEFT:   LAST APPOINTMENT DATE: @9 /18/2025  NEXT APPOINTMENT DATE:@10 /27/2025  DO WE HAVE YOUR PERMISSION TO LEAVE A DETAILED MESSAGE?: Yes  OTHER COMMENTS:    **Let patient know to contact pharmacy at the end of the day to make sure medication is ready. **  ** Please notify patient to allow 48-72 hours to process**  **Encourage patient to contact the pharmacy for refills or they can request refills through [facility]**

## 2024-07-29 DIAGNOSIS — F4310 Post-traumatic stress disorder, unspecified: Secondary | ICD-10-CM | POA: Diagnosis not present

## 2024-07-29 DIAGNOSIS — F411 Generalized anxiety disorder: Secondary | ICD-10-CM | POA: Diagnosis not present

## 2024-07-30 ENCOUNTER — Other Ambulatory Visit: Payer: Self-pay | Admitting: Nurse Practitioner

## 2024-07-30 ENCOUNTER — Encounter: Payer: Self-pay | Admitting: Cardiology

## 2024-07-30 ENCOUNTER — Ambulatory Visit: Attending: Cardiology | Admitting: Cardiology

## 2024-07-30 VITALS — BP 142/78 | HR 84 | Ht 66.0 in | Wt 255.2 lb

## 2024-07-30 DIAGNOSIS — E1165 Type 2 diabetes mellitus with hyperglycemia: Secondary | ICD-10-CM

## 2024-07-30 DIAGNOSIS — I10 Essential (primary) hypertension: Secondary | ICD-10-CM | POA: Diagnosis not present

## 2024-07-30 DIAGNOSIS — E782 Mixed hyperlipidemia: Secondary | ICD-10-CM | POA: Insufficient documentation

## 2024-07-30 DIAGNOSIS — I2510 Atherosclerotic heart disease of native coronary artery without angina pectoris: Secondary | ICD-10-CM | POA: Diagnosis not present

## 2024-07-30 MED ORDER — ASPIRIN 81 MG PO TBEC: 81.0000 mg | DELAYED_RELEASE_TABLET | Freq: Every day | ORAL | Status: AC

## 2024-07-30 NOTE — Telephone Encounter (Signed)
 Copied from CRM [id number]. Topic: Clinical - Medication Refill >> Jul 30, 2024  4:30 PM Shanda MATSU wrote: Medication: lisinopril  (ZESTRIL ) 20 MG tablet  Has the patient contacted their pharmacy? Yes, pharmacy calling in behalf of patient (Agent: If no, request that the patient contact the pharmacy for the refill. If patient does not wish to contact the pharmacy document the reason why and proceed with request.) (Agent: If yes, when and what did the pharmacy advise?)  This is the patient's preferred pharmacy:  [facility], [place] - [address] [employer] [place] [address] Phone: [phone] Fax: [phone]  Is this the correct pharmacy for this prescription? Yes If no, delete pharmacy and type the correct one.   Has the prescription been filled recently? No  Is the patient out of the medication? Yes  Has the patient been seen for an appointment in the last year OR does the patient have an upcoming appointment? Yes  Can we respond through MyChart? Yes  Agent: Please be advised that Rx refills may take up to 3 business days. We ask that you follow-up with your pharmacy.

## 2024-07-30 NOTE — Patient Instructions (Signed)
 Medication Instructions:  - START aspirin  81 mg daily  *If you need a refill on your cardiac medications before your next appointment, please call your pharmacy*  Lab Work: No labs ordered today  If you have labs (blood work) drawn today and your tests are completely normal, you will receive your results only by: MyChart Message (if you have MyChart) OR A paper copy in the mail If you have any lab test that is abnormal or we need to change your treatment, we will call you to review the results.  Testing/Procedures: No test ordered today   Follow-Up: At [facility], you and your health needs are our priority.  As part of our continuing mission to provide you with exceptional heart care, our providers are all part of one team.  This team includes your primary Cardiologist (physician) and Advanced Practice Providers or APPs (Physician Assistants and Nurse Practitioners) who all work together to provide you with the care you need, when you need it.  Your next appointment:   1 year(s)  Provider:   You may see Redell Cave, MD or one of the following Advanced Practice Providers on your designated Care Team:   Lonni Meager, NP Lesley Maffucci, PA-C Bernardino Bring, PA-C Cadence Aneth, PA-C Tylene Lunch, NP Barnie Hila, NP    We recommend signing up for the patient portal called MyChart.  Sign up information is provided on this After Visit Summary.  MyChart is used to connect with patients for Virtual Visits (Telemedicine).  Patients are able to view lab/test results, encounter notes, upcoming appointments, etc.  Non-urgent messages can be sent to your provider as well.   To learn more about what you can do with MyChart, go to [email or web address].

## 2024-07-30 NOTE — Progress Notes (Signed)
 Cardiology Office Note:    Date:  07/30/2024   ID:  Alison [name], DOB 1976-03-30, MRN [id number]  PCP:  Gareth Mliss FALCON, FNP   [facility] Providers Cardiologist:  Redell Cave, MD     Referring MD: Ernest Ronal BRAVO, MD   Chief Complaint  Patient presents with   Follow-up    Pt doing better.    History of Present Illness:    Alison [name] is a 48 y.o. female with a hx of CAD (LAD calcifications on chest CT ), hypertension, diabetes, obesity, liver transplant 1993, chronic RBBB who presents for follow-up.    Recently seen in the ED for left leg swelling, CT chest obtained to rule out PE.  LAD calcifications noted on chest CT 07/2024.  She denies chest pain.  Compliant with Crestor  as prescribed.  Swelling seems to have improved, denies any concerns at this time.  Prior notes Echo 02/2023 EF 55 to 60% Echo 10/2020 EF 60 to 65%, normal diastolic function Lexiscan  Myoview  09/2021, no evidence of ischemia, low risk sca QT prolongation secondary to right bundle branch block, corrected QT is normal. Patient not on statins due to history of liver transplant  Past Medical History:  Diagnosis Date   Abnormal uterine bleeding    Allergy    Anxiety    Arthritis    Bipolar disorder (manic depression) (HCC)    Cancer (HCC) Diffuse B-Cell Lymphoma   2015   Chest pain    a. 09/2021 MV: Low risk without ischemia or infarct.  Normal EF.   Chronic kidney failure    Chronic renal disease, stage III (HCC)    Diabetes mellitus without complication (HCC)    DLBCL (diffuse large B cell lymphoma) (HCC) 2015   Right axillary lymph node resected and chemo tx's.   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 02/2023 Echo: EF 55-60%, no rwma, nl RV size/fxn. Mildly dil LA.   Personality disorder (HCC)    Pineal gland cyst    Post herpetic neuralgia    PTSD (post-traumatic stress disorder)    RBBB    Renal disorder    S/P liver transplant (HCC) 1993   Sleep apnea    has not recieved cpap yet-other one was recalled    Past Surgical History:  Procedure Laterality Date   ARTERY BIOPSY Right 04/14/2024   Procedure: BIOPSY TEMPORAL ARTERY;  Surgeon: Marea Selinda RAMAN, MD;  Location: [facility] ORS;  Service: General;  Laterality: Right;   BONE MARROW BIOPSY  01/13/2015   BREAST BIOPSY Right 12/2014   US  Axilla Bx, Positive for Lymphoma   BREAST BIOPSY Right 2011   benign   BREAST BIOPSY Right 2023   Stereo bx, X-clip, Fat Necrosis   BREAST BIOPSY Right 01/20/2024   US  RT BREAST BX W LOC DEV 1ST LESION IMG BX SPEC US  GUIDE 01/20/2024 [facility]   BREAST SURGERY Right 2016  Lymphoma, lymph node removal. pt then got a seroma and had to do more surgery   CHOLECYSTECTOMY     COLONOSCOPY     COLONOSCOPY WITH PROPOFOL  N/A 02/28/2022   Procedure: COLONOSCOPY WITH PROPOFOL ;  Surgeon: Jinny Carmine, MD;  Location: [facility] ENDOSCOPY;  Service: Endoscopy;  Laterality: N/A;   ESOPHAGOGASTRODUODENOSCOPY     ESOPHAGOGASTRODUODENOSCOPY (EGD) WITH PROPOFOL  N/A 01/09/2021   Procedure: ESOPHAGOGASTRODUODENOSCOPY (EGD) WITH PROPOFOL ;  Surgeon: Jinny Carmine, MD;  Location: [facility] ENDOSCOPY;  Service: Endoscopy;  Laterality: N/A;   HERNIA REPAIR     x4-all from liver transplant   HYSTEROSCOPY WITH D & C N/A 11/13/2021   Procedure: DILATATION AND CURETTAGE /HYSTEROSCOPY;  Surgeon: Victor Claudell SAUNDERS, MD;  Location: [facility] ORS;  Service: Gynecology;  Laterality: N/A;   infusaport     LIVER TRANSPLANT  12/17/1991   LUMBAR PUNCTURE     PORT A CATH INJECTION ([facility] HX)     RENAL BIOPSY      tumor removal  2015   cancer right side    Current Medications: Current Meds  Medication Sig   Accu-Chek Softclix Lancets lancets 2 (two) times daily.   albuterol  (VENTOLIN  HFA) 108 (90 Base) MCG/ACT inhaler Inhale 2 puffs into the lungs every 6 (six) hours as needed for wheezing or shortness of breath.   aspirin  EC 81 MG tablet Take 1 tablet (81 mg total) by mouth daily. Swallow whole.   baclofen  (LIORESAL ) 10 MG tablet Take 1 tablet by mouth 2 (two) times daily.   benztropine  (COGENTIN ) 1 MG tablet TAKE 1 TABLET BY MOUTH ONCE DAILY AS NEEDED FOR TREMORS   Continuous Glucose Sensor (FREESTYLE LIBRE 3 PLUS SENSOR) MISC CHANGE SENSOR EVERY 15 DAYS   fluticasone  (FLONASE ) 50 MCG/ACT nasal spray Place into the nose.   glipiZIDE  (GLUCOTROL  XL) 10 MG 24 hr tablet TAKE 1 TABLET BY MOUTH IN THE MORNING AND AT BEDTIME   glucose blood (RELION GLUCOSE TEST STRIPS) test strip Use to check blood sugar 3 times a day.   insulin  glargine (LANTUS  SOLOSTAR) 100 UNIT/ML Solostar Pen Inject 40 Units into the skin 2 (two) times daily. (Patient taking differently: Inject 40 Units into the skin daily. Taking 40 units at night before bed)   lisinopril  (ZESTRIL ) 20 MG tablet TAKE 1 TABLET BY MOUTH ONCE DAILY *PATIENT WILL NEED TO SCHEDULE AN APPOINTMENT FOR ADDITIONAL REFILLS* (Patient taking differently: Take 20 mg by mouth daily. Not paused)   lurasidone  (LATUDA ) 20 MG TABS tablet Take 1 tablet (20 mg total) by mouth daily with supper.   magnesium  oxide (MAG-OX) 400 MG tablet Take 1,000 mg by mouth 2 (two) times daily.   metFORMIN  (GLUCOPHAGE ) 1000 MG tablet Take 1 tablet (1,000 mg total) by mouth 2 (two) times daily with a meal.   mirabegron  ER (MYRBETRIQ ) 50 MG TB24 tablet Take 1 tablet (50 mg total) by mouth daily.   mirtazapine  (REMERON ) 15 MG tablet TAKE 1 TABLET BY MOUTH AT BEDTIME   naloxone  (NARCAN ) nasal spray 4 mg/0.1 mL Place 0.4 mg into the nose once.   NOVOLOG  FLEXPEN 100 UNIT/ML FlexPen Inject 40  Units into the skin 3 (three) times daily with meals. Hold if not eating at least half of a full meal. (Patient taking differently: Inject 9 Units into the skin 3 (three) times daily with meals. Add additional unit depending on what glucose is)   ondansetron  (ZOFRAN ) 4 MG tablet Take 1 tablet (4 mg total) by mouth every 6 (six) hours as needed for nausea.  oxyCODONE  (OXY IR/ROXICODONE ) 5 MG immediate release tablet Take 5 mg by mouth 3 (three) times daily as needed.   pantoprazole  (PROTONIX ) 40 MG tablet Take 1 tablet (40 mg total) by mouth daily. Recommend using while on Prednisone .   PRECISION QID TEST test strip    pregabalin  (LYRICA ) 200 MG capsule Take 1 capsule (200 mg total) by mouth 2 (two) times daily.   rosuvastatin  (CRESTOR ) 10 MG tablet TAKE 1 TABLET BY MOUTH ONCE DAILY   Semaglutide , 2 MG/DOSE, 8 MG/3ML SOPN Inject 2 mg as directed once a week.   SURE COMFORT PEN NEEDLES 32G X 6 MM MISC USE AS DIRECTED   tacrolimus  (PROGRAF ) 1 MG capsule Take 1 capsule by mouth every morning.   UBRELVY  50 MG TABS Take 1 tablet by mouth daily as needed.   venlafaxine  XR (EFFEXOR -XR) 37.5 MG 24 hr capsule Take 1 capsule (37.5 mg total) by mouth every morning. Take along with 75 mg daily   venlafaxine  XR (EFFEXOR -XR) 75 MG 24 hr capsule Take 1 capsule (75 mg total) by mouth daily with breakfast. Take along with 37.5 mg daily     Allergies:   Penicillin g, Lamictal  [lamotrigine ], Tape, Carbamazepine, Hydrocodone-acetaminophen , Naproxen, Penicillins, and Vancomycin    Social History   Socioeconomic History   Marital status: Single    Spouse name: Not on file   Number of children: 0   Years of education: 9   Highest education level: GED or equivalent  Occupational History   Occupation: disabled  Tobacco Use   Smoking status: Never    Passive exposure: Past   Smokeless tobacco: Never   Tobacco comments:    Occasional CBD oil  Vaping Use   Vaping status: Never Used  Substance and Sexual  Activity   Alcohol use: Not Currently   Drug use: Not Currently    Comment: pain managment last used in early april   Sexual activity: Not Currently  Other Topics Concern   Not on file  Social History Narrative   Not on file   Social Drivers of Health   Financial Resource Strain: Low Risk  (04/01/2024)   Overall Financial Resource Strain (CARDIA)    Difficulty of Paying Living Expenses: Not very hard  Food Insecurity: No Food Insecurity (07/26/2024)   Hunger Vital Sign    Worried About Running Out of Food in the Last Year: Never true    Ran Out of Food in the Last Year: Never true  Transportation Needs: No Transportation Needs (07/26/2024)   PRAPARE - Administrator, Civil Service (Medical): No    Lack of Transportation (Non-Medical): No  Physical Activity: Inactive (04/01/2024)   Exercise Vital Sign    Days of Exercise per Week: 0 days    Minutes of Exercise per Session: 0 min  Stress: Stress Concern Present (04/01/2024)   [employer] of Occupational Health - Occupational Stress Questionnaire    Feeling of Stress: To some extent  Social Connections: Unknown (04/12/2024)   Social Connection and Isolation Panel    Frequency of Communication with Friends and Family: Patient declined    Frequency of Social Gatherings with Friends and Family: Patient declined    Attends Religious Services: Patient declined    Database administrator or Organizations: Patient declined    Attends Banker Meetings: Patient declined    Marital Status: Living with partner     Family History: The patient's family history includes Alcohol abuse in her brother and maternal uncle; Anxiety  disorder in her mother; Arthritis in her mother; Bipolar disorder in her mother; COPD in her maternal grandmother; Cancer in her maternal aunt, maternal grandfather, and maternal uncle; Cancer - Other in her mother; Depression in her brother and mother; Diabetes in her father; Heart disease in her  father and mother; Hypertension in her brother, father, and mother; Lupus in her paternal grandmother; Parkinson's disease in her maternal grandmother.  ROS:   Please see the history of present illness.     All other systems reviewed and are negative.  EKGs/Labs/Other Studies Reviewed:    The following studies were reviewed today:   Recent Labs: 04/01/2024: TSH 1.56 06/04/2024: ALT 35 07/27/2024: BUN 17; Creatinine, Ser 0.89; Hemoglobin 10.0; Platelets 137; Potassium 3.8; Sodium 139  Recent Lipid Panel    Component Value Date/Time   CHOL 123 06/04/2024 1917   CHOL 173 12/03/2021 0804   TRIG 150 (H) 06/04/2024 1917   HDL 44 06/04/2024 1917   HDL 38 (L) 12/03/2021 0804   CHOLHDL 2.8 06/04/2024 1917   VLDL 30 06/04/2024 1917   LDLCALC 49 06/04/2024 1917   LDLCALC 57 04/01/2024 1043     Risk Assessment/Calculations:          Physical Exam:    VS:  BP (!) 142/78 (BP Location: Left Wrist, Patient Position: Sitting, Cuff Size: Normal)   Pulse 84   Ht 5' 6 (1.676 m)   Wt 255 lb 3.2 oz (115.8 kg)   LMP 10/16/2021 (Exact Date) Comment: Seen by OB GYN on 10/16/21 for 6 days of excessive bleeding as a post menapause  SpO2 94%   BMI 41.19 kg/m     Wt Readings from Last 3 Encounters:  07/30/24 255 lb 3.2 oz (115.8 kg)  07/27/24 250 lb (113.4 kg)  07/21/24 251 lb 3.2 oz (113.9 kg)     GEN:  Well nourished, well developed in no acute distress HEENT: Normal NECK: No JVD; No carotid bruits CARDIAC: RRR, no murmurs, rubs, gallops RESPIRATORY:  Clear to auscultation without rales, wheezing or rhonchi  ABDOMEN: Soft, non-tender, non-distended MUSCULOSKELETAL:  No edema; No deformity  SKIN: Warm and dry NEUROLOGIC:  Alert and oriented x 3 PSYCHIATRIC:  Normal affect   ASSESSMENT:    1. Coronary artery disease involving native coronary artery of native heart, unspecified whether angina present   2. Primary hypertension   3. Mixed hyperlipidemia   4. Morbid obesity (HCC)      PLAN:    In order of problems listed above:  CAD, LAD calcifications on chest CT.  Echo 5/24 EF 55 to 60%.  Denies chest pain.  Start aspirin  81 mg daily, continue Crestor  20 mg daily. Hypertension, BP elevated today, usually controlled.  Continue lisinopril  20 mg daily. Hyperlipidemia, cholesterol controlled.  Continue Crestor  20 mg daily.,  Obesity, continue low-calorie diet, weight loss.  Follow-up in 1 year.    Medication Adjustments/Labs and Tests Ordered: Current medicines are reviewed at length with the patient today.  Concerns regarding medicines are outlined above.  No orders of the defined types were placed in this encounter.   Meds ordered this encounter  Medications   aspirin  EC 81 MG tablet    Sig: Take 1 tablet (81 mg total) by mouth daily. Swallow whole.     Patient Instructions  Medication Instructions:  - START aspirin  81 mg daily  *If you need a refill on your cardiac medications before your next appointment, please call your pharmacy*  Lab Work: No labs ordered today  If you have labs (blood work) drawn today and your tests are completely normal, you will receive your results only by: MyChart Message (if you have MyChart) OR A paper copy in the mail If you have any lab test that is abnormal or we need to change your treatment, we will call you to review the results.  Testing/Procedures: No test ordered today   Follow-Up: At [facility], you and your health needs are our priority.  As part of our continuing mission to provide you with exceptional heart care, our providers are all part of one team.  This team includes your primary Cardiologist (physician) and Advanced Practice Providers or APPs (Physician Assistants and Nurse Practitioners) who all work together to provide you with the care you need, when you need it.  Your next appointment:   1 year(s)  Provider:   You may see Redell Cave, MD or one of the following Advanced  Practice Providers on your designated Care Team:   Lonni Meager, NP Lesley Maffucci, PA-C Bernardino Bring, PA-C Cadence [place], PA-C Tylene Lunch, NP Barnie Hila, NP    We recommend signing up for the patient portal called MyChart.  Sign up information is provided on this After Visit Summary.  MyChart is used to connect with patients for Virtual Visits (Telemedicine).  Patients are able to view lab/test results, encounter notes, upcoming appointments, etc.  Non-urgent messages can be sent to your provider as well.   To learn more about what you can do with MyChart, go to [email or web address].             Signed, Redell Cave, MD  07/30/2024 10:36 AM    [facility]

## 2024-08-02 ENCOUNTER — Ambulatory Visit: Admitting: "Endocrinology

## 2024-08-02 ENCOUNTER — Encounter: Payer: Self-pay | Admitting: "Endocrinology

## 2024-08-02 ENCOUNTER — Other Ambulatory Visit: Payer: Self-pay | Admitting: "Endocrinology

## 2024-08-02 VITALS — BP 122/80 | HR 82 | Ht 66.0 in | Wt 255.0 lb

## 2024-08-02 DIAGNOSIS — E1165 Type 2 diabetes mellitus with hyperglycemia: Secondary | ICD-10-CM | POA: Diagnosis not present

## 2024-08-02 DIAGNOSIS — E782 Mixed hyperlipidemia: Secondary | ICD-10-CM | POA: Diagnosis not present

## 2024-08-02 DIAGNOSIS — Z7984 Long term (current) use of oral hypoglycemic drugs: Secondary | ICD-10-CM

## 2024-08-02 DIAGNOSIS — Z7985 Long-term (current) use of injectable non-insulin antidiabetic drugs: Secondary | ICD-10-CM | POA: Diagnosis not present

## 2024-08-02 DIAGNOSIS — E119 Type 2 diabetes mellitus without complications: Secondary | ICD-10-CM

## 2024-08-02 DIAGNOSIS — Z794 Long term (current) use of insulin: Secondary | ICD-10-CM | POA: Diagnosis not present

## 2024-08-02 MED ORDER — DEXCOM G7 SENSOR MISC: 1.0000 | 3 refills | Status: AC

## 2024-08-02 MED ORDER — LISINOPRIL 20 MG PO TABS: 20.0000 mg | ORAL_TABLET | Freq: Every day | ORAL | 0 refills | Status: DC

## 2024-08-02 NOTE — Telephone Encounter (Signed)
 Requested Prescriptions  Pending Prescriptions Disp Refills   lisinopril  (ZESTRIL ) 20 MG tablet 90 tablet 0    Sig: Take 1 tablet (20 mg total) by mouth daily. Not paused     Cardiovascular:  ACE Inhibitors Passed - 08/02/2024  1:57 PM      Passed - Cr in normal range and within 180 days    Creat  Date Value Ref Range Status  04/01/2024 1.14 (H) 0.50 - 1.03 mg/dL Final   Creatinine, Ser  Date Value Ref Range Status  07/27/2024 0.89 0.44 - 1.00 mg/dL Final   Creatinine, Urine  Date Value Ref Range Status  11/13/2023 75 20 - 275 mg/dL Final         Passed - K in normal range and within 180 days    Potassium  Date Value Ref Range Status  07/27/2024 3.8 3.5 - 5.1 mmol/L Final         Passed - Patient is not pregnant      Passed - Last BP in normal range    BP Readings from Last 1 Encounters:  08/02/24 122/80         Passed - Valid encounter within last 6 months    Recent Outpatient Visits           1 week ago Dysuria   [facility] Gareth Mliss FALCON, FNP   3 weeks ago Pain of left calf   [facility] LABOR, MD   1 month ago Burning with urination   [facility] Gareth Mliss FALCON, FNP   2 months 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   [facility] Gareth Mliss FALCON, FNP   4 months ago Essential hypertension   [facility] Gareth Mliss FALCON, FNP       Future Appointments             In 3 months Gareth, Mliss FALCON, FNP [facility] [place]

## 2024-08-02 NOTE — Progress Notes (Signed)
 Outpatient Endocrinology Note Alison Birmingham, MD  08/02/24   Alison [name] 1976/08/03 [id number]  Referring Provider: Gareth Mliss FALCON, FNP Primary Care Provider: Gareth Mliss FALCON, FNP Reason for consultation: Subjective   Assessment & Plan  Diagnoses and all orders for this visit:  Uncontrolled type 2 diabetes mellitus with hyperglycemia (HCC)  Long term (current) use of oral hypoglycemic drugs  Long-term insulin  use (HCC)  Long-term (current) use of injectable non-insulin  antidiabetic drugs  Mixed hypercholesterolemia and hypertriglyceridemia  Other orders -     Continuous Glucose Sensor (DEXCOM G7 SENSOR) MISC; 1 Device by Does not apply route continuous.    Diabetes Type II complicated by neuropathy, No results found for: GFR Hba1c goal less than 7, current Hba1c is  Lab Results  Component Value Date   HGBA1C 8.7 (H) 06/04/2024   Will recommend the following: 11/2023 C-peptide + Reinforced compliance  Basaglar  38 units at bedtime  Novolog  9 units three times a day 15 min before meals + correction scale  Novolog  correction scale: Use in addition to your meal time insulin  based on blood sugars as follows: 151 - 175: 1 unit 176 - 200: 2 units 201 - 225: 3 units 226 - 250: 4 units 251 - 275: 5 units 276 - 300: 6 units 301 - 325: 7 units 326 - 350: 8 units 351 - 375: 9 units 376 - 400: 10 units  Continue Metformin  1000 mg bid Continue Glipizide  XL 10 mg BID Ozempic  1 mg weekly ->2 mg after 4 weeks  Ordered DM education Ordered DexCom, not covered [employer] +, not covered  Discussed that its unsafe not to check BG before meals, patient understands and greed  Ran out of Trulicity  previously, started on ozempic  and ran out 2 weeks ago   No known contraindications/side effects to any of above medications Glucagon discussed and prescribed with refills on 11/2023  -Last LD and Tg are as follows: Lab Results  Component Value Date   LDLCALC 49  06/04/2024    Lab Results  Component Value Date   TRIG 150 (H) 06/04/2024   -On rosuvastatin  10 mg QD -Follow low fat diet and exercise   -Blood pressure goal <140/90 - Microalbumin/creatinine goal is < 30 -Last MA/Cr is as follows: Lab Results  Component Value Date   MICROALBUR 41.5 11/13/2023   -not on ACE/ARB stopped lisinopril  20 mg every day  -diet changes including salt restriction -limit eating outside -counseled BP targets per standards of diabetes care -uncontrolled blood pressure can lead to retinopathy, nephropathy and cardiovascular and atherosclerotic heart disease  Reviewed and counseled on: -A1C target -Blood sugar targets -Complications of uncontrolled diabetes  -Checking blood sugar before meals and bedtime and bring log next visit -All medications with mechanism of action and side effects -Hypoglycemia management: rule of 15's, Glucagon Emergency Kit and medical alert ID -low-carb low-fat plate-method diet -At least 20 minutes of physical activity per day -Annual dilated retinal eye exam and foot exam -compliance and follow up needs -follow up as scheduled or earlier if problem gets worse  Call if blood sugar is less than 70 or consistently above 250    Take a 15 gm snack of carbohydrate at bedtime before you go to sleep if your blood sugar is less than 100.    If you are going to fast after midnight for a test or procedure, ask your physician for instructions on how to reduce/decrease your insulin  dose.    Call if  blood sugar is less than 70 or consistently above 250  -Treating a low sugar by rule of 15  (15 gms of sugar every 15 min until sugar is more than 70) If you feel your sugar is low, test your sugar to be sure If your sugar is low (less than 70), then take 15 grams of a fast acting Carbohydrate (3-4 glucose tablets or glucose gel or 4 ounces of juice or regular soda) Recheck your sugar 15 min after treating low to make sure it is more than  70 If sugar is still less than 70, treat again with 15 grams of carbohydrate          Don't drive the hour of hypoglycemia  If unconscious/unable to eat or drink by mouth, use glucagon injection or nasal spray baqsimi and call 911. Can repeat again in 15 min if still unconscious.  No follow-ups on file.   I have reviewed current medications, nurse's notes, allergies, vital signs, past medical and surgical history, family medical history, and social history for this encounter. Counseled patient on symptoms, examination findings, lab findings, imaging results, treatment decisions and monitoring and prognosis. The patient understood the recommendations and agrees with the treatment plan. All questions regarding treatment plan were fully answered.  Alison Birmingham, MD  08/02/24   History of Present Illness Alison [name] is a 48 y.o. year old female who presents for follow up of Type II diabetes mellitus.  Alison [name] was first diagnosed around 2020.   Diabetes education +  Home diabetes regimen: Basaglar  38 units at bedtime  Novolog  9 units three times a day 15 min before meals + correction scale  Novolog  correction scale: Use in addition to your meal time insulin  based on blood sugars as follows: 151 - 175: 1 unit 176 - 200: 2 units 201 - 225: 3 units 226 - 250: 4 units 251 - 275: 5 units 276 - 300: 6 units 301 - 325: 7 units 326 - 350: 8 units 351 - 375: 9 units 376 - 400: 10 units  Metformin  1000 mg bid Glipizide  XL 10 gm bid  Ozempic  2 mg weekly: well tolerated without issues   Ran out of Trulicity  for 2 mo   COMPLICATIONS -  MI/Stroke -  retinopathy +  neuropathy -  nephropathy  BLOOD SUGAR DATA 60-467 BG  Physical Exam  BP 122/80   Pulse 82   Ht 5' 6 (1.676 m)   Wt 255 lb (115.7 kg)   LMP 10/16/2021 (Exact Date) Comment: Seen by OB GYN on 10/16/21 for 6 days of excessive bleeding as a post menapause  SpO2 98%   BMI 41.16 kg/m    Constitutional:  well developed, well nourished Head: normocephalic, atraumatic Eyes: sclera anicteric, no redness Neck: supple Lungs: normal respiratory effort Neurology: alert and oriented Skin: dry, no appreciable rashes Musculoskeletal: no appreciable defects Psychiatric: normal mood and affect Diabetic Foot Exam - Simple   No data filed      Current Medications Patient's Medications  New Prescriptions   CONTINUOUS GLUCOSE SENSOR (DEXCOM G7 SENSOR) MISC    1 Device by Does not apply route continuous.  Previous Medications   ACCU-CHEK SOFTCLIX LANCETS LANCETS    2 (two) times daily.   ALBUTEROL  (VENTOLIN  HFA) 108 (90 BASE) MCG/ACT INHALER    Inhale 2 puffs into the lungs every 6 (six) hours as needed for wheezing or shortness of breath.   ASPIRIN  EC 81 MG TABLET    Take  1 tablet (81 mg total) by mouth daily. Swallow whole.   BACLOFEN  (LIORESAL ) 10 MG TABLET    Take 1 tablet by mouth 2 (two) times daily.   BENZTROPINE  (COGENTIN ) 1 MG TABLET    TAKE 1 TABLET BY MOUTH ONCE DAILY AS NEEDED FOR TREMORS   FLUTICASONE  (FLONASE ) 50 MCG/ACT NASAL SPRAY    Place into the nose.   GLIPIZIDE  (GLUCOTROL  XL) 10 MG 24 HR TABLET    TAKE 1 TABLET BY MOUTH IN THE MORNING AND AT BEDTIME   GLUCOSE BLOOD (RELION GLUCOSE TEST STRIPS) TEST STRIP    Use to check blood sugar 3 times a day.   INSULIN  GLARGINE (LANTUS  SOLOSTAR) 100 UNIT/ML SOLOSTAR PEN    Inject 40 Units into the skin 2 (two) times daily.   LISINOPRIL  (ZESTRIL ) 20 MG TABLET    TAKE 1 TABLET BY MOUTH ONCE DAILY *PATIENT WILL NEED TO SCHEDULE AN APPOINTMENT FOR ADDITIONAL REFILLS*   LURASIDONE  (LATUDA ) 20 MG TABS TABLET    Take 1 tablet (20 mg total) by mouth daily with supper.   MAGNESIUM  OXIDE (MAG-OX) 400 MG TABLET    Take 1,000 mg by mouth 2 (two) times daily.   METFORMIN  (GLUCOPHAGE ) 1000 MG TABLET    Take 1 tablet (1,000 mg total) by mouth 2 (two) times daily with a meal.   MIRABEGRON  ER (MYRBETRIQ ) 50 MG TB24 TABLET    Take 1 tablet (50 mg total) by  mouth daily.   MIRTAZAPINE  (REMERON ) 15 MG TABLET    TAKE 1 TABLET BY MOUTH AT BEDTIME   NALOXONE  (NARCAN ) NASAL SPRAY 4 MG/0.1 ML    Place 0.4 mg into the nose once.   NOVOLOG  FLEXPEN 100 UNIT/ML FLEXPEN    Inject 40 Units into the skin 3 (three) times daily with meals. Hold if not eating at least half of a full meal.   ONDANSETRON  (ZOFRAN ) 4 MG TABLET    Take 1 tablet (4 mg total) by mouth every 6 (six) hours as needed for nausea.   OXYCODONE  (OXY IR/ROXICODONE ) 5 MG IMMEDIATE RELEASE TABLET    Take 5 mg by mouth 3 (three) times daily as needed.   PANTOPRAZOLE  (PROTONIX ) 40 MG TABLET    Take 1 tablet (40 mg total) by mouth daily. Recommend using while on Prednisone .   PRECISION QID TEST TEST STRIP       PREGABALIN  (LYRICA ) 200 MG CAPSULE    Take 1 capsule (200 mg total) by mouth 2 (two) times daily.   ROSUVASTATIN  (CRESTOR ) 10 MG TABLET    TAKE 1 TABLET BY MOUTH ONCE DAILY   SEMAGLUTIDE , 2 MG/DOSE, 8 MG/3ML SOPN    Inject 2 mg as directed once a week.   SURE COMFORT PEN NEEDLES 32G X 6 MM MISC    USE AS DIRECTED   TACROLIMUS  (PROGRAF ) 1 MG CAPSULE    Take 1 capsule by mouth every morning.   UBRELVY  50 MG TABS    Take 1 tablet by mouth daily as needed.   VENLAFAXINE  XR (EFFEXOR -XR) 37.5 MG 24 HR CAPSULE    Take 1 capsule (37.5 mg total) by mouth every morning. Take along with 75 mg daily   VENLAFAXINE  XR (EFFEXOR -XR) 75 MG 24 HR CAPSULE    Take 1 capsule (75 mg total) by mouth daily with breakfast. Take along with 37.5 mg daily  Modified Medications   No medications on file  Discontinued Medications   CONTINUOUS GLUCOSE SENSOR (FREESTYLE LIBRE 3 PLUS SENSOR) MISC    CHANGE SENSOR  EVERY 15 DAYS    Allergies Allergies  Allergen Reactions   Penicillin G Itching, Rash, Anaphylaxis and Palpitations   Lamictal  [Lamotrigine ] Rash   Tape    Carbamazepine Other (See Comments)    Medication interaction-prograf     Hydrocodone-Acetaminophen  Itching   Naproxen Itching   Penicillins     Vancomycin  Rash    Macular/blotchy rash at infusion site    Past Medical History Past Medical History:  Diagnosis Date   Abnormal uterine bleeding    Allergy    Anxiety    Arthritis    Bipolar disorder (manic depression) (HCC)    Cancer (HCC) Diffuse B-Cell Lymphoma   2015   Chest pain    a. 09/2021 MV: Low risk without ischemia or infarct.  Normal EF.   Chronic kidney failure    Chronic renal disease, stage III (HCC)    Diabetes mellitus without complication (HCC)    DLBCL (diffuse large B cell lymphoma) (HCC) 2015   Right axillary lymph node resected and chemo tx's.   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 02/2023 Echo: EF 55-60%, no rwma, nl RV size/fxn. Mildly dil LA.   Personality disorder (HCC)    Pineal gland cyst    Post herpetic neuralgia    PTSD (post-traumatic stress disorder)    RBBB    Renal disorder    S/P liver transplant (HCC) 1993   Sleep apnea    has not recieved cpap yet-other one was recalled    Past Surgical History Past Surgical History:  Procedure Laterality Date   ARTERY BIOPSY Right 04/14/2024   Procedure: BIOPSY TEMPORAL ARTERY;  Surgeon: Marea Selinda RAMAN, MD;  Location: [facility] ORS;  Service: General;  Laterality: Right;   BONE MARROW BIOPSY  01/13/2015   BREAST BIOPSY Right 12/2014   US  Axilla Bx, Positive for Lymphoma   BREAST BIOPSY Right 2011   benign   BREAST BIOPSY Right 2023   Stereo bx, X-clip, Fat Necrosis   BREAST BIOPSY Right 01/20/2024   US  RT BREAST BX W LOC DEV 1ST LESION IMG BX SPEC US  GUIDE 01/20/2024 [facility]    BREAST SURGERY Right 2016   Lymphoma, lymph node removal. pt then got a seroma and had to do more surgery   CHOLECYSTECTOMY     COLONOSCOPY     COLONOSCOPY WITH PROPOFOL  N/A 02/28/2022   Procedure: COLONOSCOPY WITH PROPOFOL ;  Surgeon: Jinny Carmine, MD;  Location: [facility] END[facility];  Service: Endoscopy;  Laterality: N/A;   ESOPHAGOGASTRODUODENOSCOPY     ESOPHAGOGASTRODUODENOSCOPY (EGD) WITH PROPOFOL  N/A 01/09/2021   Procedure: ESOPHAGOGASTRODUODENOSCOPY (EGD) WITH PROPOFOL ;  Surgeon: Jinny Carmine, MD;  Location: [facility] ENDOSCOPY;  Service: Endoscopy;  Laterality: N/A;   HERNIA REPAIR     x4-all from liver transplant   HYSTEROSCOPY WITH D & C N/A 11/13/2021   Procedure: DILATATION AND CURETTAGE /HYSTEROSCOPY;  Surgeon: Victor Claudell SAUNDERS, MD;  Location: [facility] OR[facility];  Service: Gynecology;  Laterality:  N/A;   infusaport     LIVER TRANSPLANT  12/17/1991   LUMBAR PUNCTURE     PORT A CATH INJECTION ([facility] H[facility])     RENAL BIOPSY     tumor removal  2015   cancer right side    Family History family history includes Alcohol abuse in her brother and maternal uncle; Anxiety disorder in her mother; Arthritis in her mother; Bipolar disorder in her mother; COPD in her maternal grandmother; Cancer in her maternal aunt, maternal grandfather, and maternal uncle; Cancer - Other in her mother; Depression in her brother and mother; Diabetes in her father; Heart disease in her father and mother; Hypertension in her brother, father, and mother; Lupus in her paternal grandmother; Parkinson's disease in her maternal grandmother.  Social History Social History   Socioeconomic History   Marital status: Single    Spouse name: Not on file   Number of children: 0   Years of education: 9   Highest education level: GED or equivalent  Occupational History   Occupation: disabled  Tobacco Use   Smoking status: Never    Passive exposure: Past   Smokeless tobacco: Never   Tobacco comments:    Occasional CBD oil  Vaping  Use   Vaping status: Never Used  Substance and Sexual Activity   Alcohol use: Not Currently   Drug use: Not Currently    Comment: pain managment last used in early april   Sexual activity: Not Currently  Other Topics Concern   Not on file  Social History Narrative   Not on file   Social Drivers of Health   Financial Resource Strain: Low Risk  (04/01/2024)   Overall Financial Resource Strain (CARDIA)    Difficulty of Paying Living Expenses: Not very hard  Food Insecurity: No Food Insecurity (07/26/2024)   Hunger Vital Sign    Worried About Running Out of Food in the Last Year: Never true    Ran Out of Food in the Last Year: Never true  Transportation Needs: No Transportation Needs (07/26/2024)   PRAPARE - Administrator, Civil Service (Medical): No    Lack of Transportation (Non-Medical): No  Physical Activity: Inactive (04/01/2024)   Exercise Vital Sign    Days of Exercise per Week: 0 days    Minutes of Exercise per Session: 0 min  Stress: Stress Concern Present (04/01/2024)   [employer] of Occupational Health - Occupational Stress Questionnaire    Feeling of Stress: To some extent  Social Connections: Unknown (04/12/2024)   Social Connection and Isolation Panel    Frequency of Communication with Friends and Family: Patient declined    Frequency of Social Gatherings with Friends and Family: Patient declined    Attends Religious Services: Patient declined    Active Member of Clubs or Organizations: Patient declined    Attends Banker Meetings: Patient declined    Marital Status: Living with partner  Intimate Partner Violence: Not At Risk (07/26/2024)   Humiliation, Afraid, Rape, and Kick questionnaire    Fear of Current or Ex-Partner: No    Emotionally Abused: No    Physically Abused: No    Sexually Abused: No    Lab Results  Component Value Date   HGBA1C 8.7 (H) 06/04/2024   HGBA1C 9.7 (H) 04/01/2024   HGBA1C 11.8 (H) 11/13/2023   Lab  Results  Component Value Date   CHOL 123 06/04/2024   Lab Results  Component Value Date   HDL 44 06/04/2024  Lab Results  Component Value Date   LDLCALC 49 06/04/2024   Lab Results  Component Value Date   TRIG 150 (H) 06/04/2024   Lab Results  Component Value Date   CHOLHDL 2.8 06/04/2024   Lab Results  Component Value Date   CREATININE 0.89 07/27/2024   No results found for: GFR Lab Results  Component Value Date   MICROALBUR 41.5 11/13/2023      Component Value Date/Time   NA 139 07/27/2024 0553   K 3.8 07/27/2024 0553   CL 103 07/27/2024 0553   CO2 19 (L) 07/27/2024 0553   GLUCOSE 159 (H) 07/27/2024 0553   BUN 17 07/27/2024 0553   CREATININE 0.89 07/27/2024 0553   CREATININE 1.14 (H) 04/01/2024 1043   CALCIUM  8.6 (L) 07/27/2024 0553   CALCIUM  7.6 (L) 06/27/2022 1448   PROT 6.9 06/04/2024 1511   ALBUMIN 3.6 06/04/2024 1511   AST 33 06/04/2024 1511   AST 22 03/03/2024 0958   ALT 35 06/04/2024 1511   ALT 19 03/03/2024 0958   ALKPHOS 62 06/04/2024 1511   BILITOT 1.0 06/04/2024 1511   BILITOT 0.5 03/03/2024 0958   GFRNONAA >60 07/27/2024 0553   GFRNONAA >60 03/03/2024 0958   GFRNONAA 64 12/11/2020 0000   GFRAA 74 12/11/2020 0000      Latest Ref Rng & Units 07/27/2024    5:53 AM 06/05/2024    5:50 AM 06/04/2024    3:11 PM  BMP  Glucose 70 - 99 mg/dL 840  726  698   BUN 6 - 20 mg/dL 17  17  23    Creatinine 0.44 - 1.00 mg/dL 9.10  9.17  9.00   Sodium 135 - 145 mmol/L 139  135  134   Potassium 3.5 - 5.1 mmol/L 3.8  4.2  4.9   Chloride 98 - 111 mmol/L 103  98  96   CO2 22 - 32 mmol/L 19  24  25    Calcium  8.9 - 10.3 mg/dL 8.6  8.2  9.3        Component Value Date/Time   WBC 7.3 07/27/2024 0553   RBC 3.54 (L) 07/27/2024 0553   HGB 10.0 (L) 07/27/2024 0553   HGB 11.9 (L) 03/03/2024 0945   HGB 14.6 10/16/2021 1122   HCT 31.5 (L) 07/27/2024 0553   HCT 44.0 10/16/2021 1122   PLT 137 (L) 07/27/2024 0553   PLT 223 03/03/2024 0945   MCV 89.0 07/27/2024  0553   MCV 88 10/16/2021 1122   MCH 28.2 07/27/2024 0553   MCHC 31.7 07/27/2024 0553   RDW 14.8 07/27/2024 0553   RDW 13.7 10/16/2021 1122   LYMPHSABS 3.2 07/27/2024 0553   LYMPHSABS 2.0 10/16/2021 1122   MONOABS 0.7 07/27/2024 0553   EOSABS 0.9 (H) 07/27/2024 0553   EOSABS 0.4 10/16/2021 1122   BASOSABS 0.1 07/27/2024 0553   BASOSABS 0.1 10/16/2021 1122     Parts of this note may have been dictated using voice recognition software. There may be variances in spelling and vocabulary which are unintentional. Not all errors are proofread. Please notify the dino if any discrepancies are noted or if the meaning of any statement is not clear.

## 2024-08-05 DIAGNOSIS — R748 Abnormal levels of other serum enzymes: Secondary | ICD-10-CM | POA: Diagnosis not present

## 2024-08-05 DIAGNOSIS — M899 Disorder of bone, unspecified: Secondary | ICD-10-CM | POA: Diagnosis not present

## 2024-08-05 DIAGNOSIS — M609 Myositis, unspecified: Secondary | ICD-10-CM | POA: Diagnosis not present

## 2024-08-05 DIAGNOSIS — R809 Proteinuria, unspecified: Secondary | ICD-10-CM | POA: Diagnosis not present

## 2024-08-05 DIAGNOSIS — R531 Weakness: Secondary | ICD-10-CM | POA: Diagnosis not present

## 2024-08-06 ENCOUNTER — Telehealth: Payer: Self-pay | Admitting: Pharmacy Technician

## 2024-08-06 ENCOUNTER — Other Ambulatory Visit (HOSPITAL_COMMUNITY): Payer: Self-pay

## 2024-08-06 NOTE — Telephone Encounter (Signed)
 Pharmacy Patient Advocate Encounter   Received notification from Fax that prior authorization for Dexcom G7 Sensor  is required/requested.   Insurance verification completed.   The patient is insured through [employer].   Per test claim: PA required; PA submitted to above mentioned insurance via Latent Key/confirmation #/EOC [id number] Status is pending

## 2024-08-06 NOTE — Telephone Encounter (Signed)
 Pharmacy Patient Advocate Encounter  Received notification from [employer] that Prior Authorization for Dexcom G7 Sensor  has been APPROVED from 08/06/24 to 02/02/25. Ran test claim, Copay is $0.00. This test claim was processed through [facility]- copay amounts may vary at other pharmacies due to pharmacy/plan contracts, or as the patient moves through the different stages of their insurance plan.   PA #/Case ID/Reference #: [id number]

## 2024-08-10 ENCOUNTER — Inpatient Hospital Stay: Attending: Oncology | Admitting: Oncology

## 2024-08-10 VITALS — BP 138/95 | HR 98 | Temp 98.4°F | Resp 19 | Ht 66.0 in | Wt 253.8 lb

## 2024-08-10 DIAGNOSIS — D472 Monoclonal gammopathy: Secondary | ICD-10-CM | POA: Insufficient documentation

## 2024-08-10 DIAGNOSIS — Z8572 Personal history of non-Hodgkin lymphomas: Secondary | ICD-10-CM | POA: Diagnosis not present

## 2024-08-10 DIAGNOSIS — F411 Generalized anxiety disorder: Secondary | ICD-10-CM | POA: Diagnosis not present

## 2024-08-10 DIAGNOSIS — Z9221 Personal history of antineoplastic chemotherapy: Secondary | ICD-10-CM | POA: Insufficient documentation

## 2024-08-10 DIAGNOSIS — F4310 Post-traumatic stress disorder, unspecified: Secondary | ICD-10-CM | POA: Diagnosis not present

## 2024-08-10 DIAGNOSIS — Z944 Liver transplant status: Secondary | ICD-10-CM | POA: Insufficient documentation

## 2024-08-10 DIAGNOSIS — Z08 Encounter for follow-up examination after completed treatment for malignant neoplasm: Secondary | ICD-10-CM

## 2024-08-10 NOTE — Progress Notes (Signed)
 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  She is currently on CellCept and tacrolimus  for the same.  She also has a history of post transplant lymphoproliferative disorder/DLBCL that was diagnosed in 2016.  She is s/p 6 cycles of R-CHOP chemotherapy back then and was in complete remission 1.  Her other past medical history significant for migraines, stage III chronic kidney disease, chemo-induced peripheral neuropathy, hypertension among other medical problems.  With regards to her diffuse large B-cell lymphoma she was getting surveillance scans and this was all done in [place] .  She has now moved to [place]  to be with her significant other.  Patient was noted to have a prior kidney mass before which was biopsied and was not consistent with malignancy.   She underwent CT neck with contrast before she left [place]  on 02/22/2019.  She was noted to have soft tissue masses in both the parotid glands which were new as compared to prior exams and possibly represent lymph nodes.  The largest one was seen in the left parotid gland measuring  1.3 x 1.1 cm.  Before she could get a work-up for this patient moved to [place]  and has not had any further work-up yet   Bilateral parotid biopsy showed extranodal mucosa associated marginal zone lymphoma.  Bone marrow biopsy was negative for lymphoma.  Patient completed 4 cycles of weekly RituxanIn September 2020.  Repeat PET CT scan showed interval resolution of the parotid lesions compatible with complete response to therapy  Interval history-patient has had recurrent episodes of ER visits and hospitalizations over the last 4 months.Her most recent hospitalization was in August for an episode of SARS of unclear etiology.  She reports ongoing fatigue.  Denies any changes in her appetite or weight  ECOG PS- 1 Pain scale- 0   Review of systems- Review of Systems  Constitutional:  Positive for malaise/fatigue. Negative for chills, fever and weight loss.  HENT:  Negative for congestion, ear discharge and nosebleeds.   Eyes:  Negative for blurred vision.  Respiratory:  Negative for cough, hemoptysis, sputum production, shortness of breath and wheezing.   Cardiovascular:  Negative for chest pain, palpitations, orthopnea and claudication.  Gastrointestinal:  Negative for abdominal pain, blood in stool, constipation, diarrhea, heartburn, melena, nausea and vomiting.  Genitourinary:  Negative for dysuria, flank pain, frequency, hematuria and urgency.  Musculoskeletal:  Negative for back pain, joint pain and myalgias.  Skin:  Negative for rash.  Neurological:  Negative for dizziness, tingling, focal weakness, seizures, weakness and headaches.  Endo/Heme/Allergies:  Does not bruise/bleed easily.  Psychiatric/Behavioral:  Negative for depression and suicidal ideas. The patient does not have insomnia.       Allergies  Allergen Reactions  Penicillin G Itching, Rash, Anaphylaxis and Palpitations   Lamictal  [Lamotrigine ] Rash   Tape    Carbamazepine Other (See Comments)    Medication  interaction-prograf     Hydrocodone-Acetaminophen  Itching   Naproxen Itching   Penicillins    Vancomycin  Rash    Macular/blotchy rash at infusion site     Past Medical History:  Diagnosis Date   Abnormal uterine bleeding    Allergy    Anxiety    Arthritis    Bipolar disorder (manic depression) (HCC)    Cancer (HCC) Diffuse B-Cell Lymphoma   2015   Chest pain    a. 09/2021 MV: Low risk without ischemia or infarct.  Normal EF.   Chronic kidney failure    Chronic renal disease, stage III (HCC)    Diabetes mellitus without complication (HCC)    DLBCL (diffuse large B cell lymphoma) (HCC) 2015   Right axillary lymph node resected and chemo tx's.   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 02/2023 Echo: EF 55-60%, no rwma, nl RV size/fxn. Mildly dil LA.   Personality disorder (HCC)    Pineal gland cyst    Post herpetic neuralgia    PTSD (post-traumatic stress disorder)    RBBB    Renal disorder    S/P liver transplant (HCC) 1993   Sleep apnea    has not recieved cpap yet-other one was recalled     Past Surgical History:  Procedure Laterality Date   ARTERY BIOPSY Right 04/14/2024   Procedure: BIOPSY TEMPORAL ARTERY;  Surgeon: Marea Selinda RAMAN, MD;  Location: [facility] ORS;  Service: General;  Laterality: Right;   BONE MARROW BIOPSY  01/13/2015   BREAST BIOPSY Right 12/2014   US  Axilla Bx, Positive for Lymphoma   BREAST BIOPSY Right 2011   benign   BREAST BIOPSY Right 2023   Stereo bx, X-clip, Fat Necrosis   BREAST BIOPSY Right 01/20/2024   US  RT BREAST BX W LOC DEV 1ST LESION  IMG BX SPEC US  GUIDE 01/20/2024 [facility]   BREAST SURGERY Right 2016   Lymphoma, lymph node removal. pt then got a seroma and had to do more surgery   CHOLECYSTECTOMY     COLONOSCOPY     COLONOSCOPY WITH PROPOFOL  N/A 02/28/2022   Procedure: COLONOSCOPY WITH PROPOFOL ;  Surgeon: Jinny Carmine, MD;  Location: [facility] ENDOSCOPY;  Service: Endoscopy;  Laterality: N/A;   ESOPHAGOGASTRODUODENOSCOPY     ESOPHAGOGASTRODUODENOSCOPY (EGD) WITH PROPOFOL  N/A 01/09/2021   Procedure: ESOPHAGOGASTRODUODENOSCOPY (EGD) WITH PROPOFOL ;  Surgeon: Jinny Carmine, MD;  Location: [facility] ENDOSCOPY;  Service: Endoscopy;  Laterality: N/A;   HERNIA REPAIR     x4-all from liver transplant   HYSTEROSCOPY WITH D & C N/A 11/13/2021   Procedure: DILATATION AND CURETTAGE /HYSTEROSCOPY;  Surgeon: Victor Claudell SAUNDERS, MD;  Location: [facility] ORS;  Service: Gynecology;  Laterality: N/A;   infusaport     LIVER TRANSPLANT  12/17/1991   LUMBAR PUNCTURE  PORT A CATH INJECTION ([facility])     RENAL BIOPSY     tumor removal  2015   cancer right side    Social History   Socioeconomic History   Marital status: Single    Spouse name: Not on file   Number of children: 0   Years of education: 9   Highest education level: GED or equivalent  Occupational History   Occupation: disabled  Tobacco Use   Smoking status: Never    Passive exposure: Past   Smokeless tobacco: Never   Tobacco comments:    Occasional CBD oil  Vaping Use   Vaping status: Never Used  Substance and Sexual Activity   Alcohol use: Not Currently   Drug use: Not Currently    Comment: pain managment last used in early april   Sexual activity: Not Currently  Other Topics Concern   Not on file  Social History Narrative   Not on file   Social Drivers of Health   Financial Resource Strain: Low Risk  (04/01/2024)   Overall Financial Resource Strain (CARDIA)    Difficulty of Paying Living Expenses: Not very hard  Food Insecurity: No Food Insecurity  (07/26/2024)   Hunger Vital Sign    Worried About Running Out of Food in the Last Year: Never true    Ran Out of Food in the Last Year: Never true  Transportation Needs: No Transportation Needs (07/26/2024)   PRAPARE - Administrator, Civil Service (Medical): No    Lack of Transportation (Non-Medical): No  Physical Activity: Inactive (04/01/2024)   Exercise Vital Sign    Days of Exercise per Week: 0 days    Minutes of Exercise per Session: 0 min  Stress: Stress Concern Present (04/01/2024)   [employer] of Occupational Health - Occupational Stress Questionnaire    Feeling of Stress: To some extent  Social Connections: Unknown (04/12/2024)   Social Connection and Isolation Panel    Frequency of Communication with Friends and Family: Patient declined    Frequency of Social Gatherings with Friends and Family: Patient declined    Attends Religious Services: Patient declined    Database Administrator or Organizations: Patient declined    Attends Banker Meetings: Patient declined    Marital Status: Living with partner  Intimate Partner Violence: Not At Risk (07/26/2024)   Humiliation, Afraid, Rape, and Kick questionnaire    Fear of Current or Ex-Partner: No    Emotionally Abused: No    Physically Abused: No    Sexually Abused: No    Family History  Problem Relation Age of Onset   Heart disease Mother    Hypertension Mother    Cancer - Other Mother    Bipolar disorder Mother    Anxiety disorder Mother    Arthritis Mother    Depression Mother    Heart disease Father    Hypertension Father    Diabetes Father    Parkinson's disease Maternal Grandmother    COPD Maternal Grandmother    Cancer Maternal Aunt    Cancer Maternal Uncle    Cancer Maternal Grandfather    Lupus Paternal Grandmother    Hypertension Brother    Alcohol abuse Brother    Depression Brother    Alcohol abuse Maternal Uncle      Current Outpatient Medications:    Accu-Chek  Softclix Lancets lancets, 2 (two) times daily., Disp: , Rfl:    albuterol  (VENTOLIN  HFA) 108 (90 Base) MCG/ACT inhaler, Inhale  2 puffs into the lungs every 6 (six) hours as needed for wheezing or shortness of breath., Disp: 8 g, Rfl: 0   aspirin  EC 81 MG tablet, Take 1 tablet (81 mg total) by mouth daily. Swallow whole., Disp: , Rfl:    baclofen  (LIORESAL ) 10 MG tablet, Take 1 tablet by mouth 2 (two) times daily., Disp: , Rfl:    benztropine  (COGENTIN ) 1 MG tablet, TAKE 1 TABLET BY MOUTH ONCE DAILY AS NEEDED FOR TREMORS, Disp: 30 tablet, Rfl: 10   Continuous Glucose Sensor (DEXCOM G7 SENSOR) MISC, 1 Device by Does not apply route continuous., Disp: 9 each, Rfl: 3   fluticasone  (FLONASE ) 50 MCG/ACT nasal spray, Place into the nose., Disp: , Rfl:    glipiZIDE  (GLUCOTROL  XL) 10 MG 24 hr tablet, TAKE 1 TABLET BY MOUTH IN THE MORNING AND AT BEDTIME, Disp: 60 tablet, Rfl: 5   glucose blood (RELION GLUCOSE TEST STRIPS) test strip, USE 1  TO CHECK GLUCOSE THREE TIMES DAILY, Disp: 300 each, Rfl: 12   insulin  glargine (LANTUS  SOLOSTAR) 100 UNIT/ML Solostar Pen, Inject 40 Units into the skin 2 (two) times daily. (Patient taking differently: Inject 40 Units into the skin daily. Taking 40 units at night before bed), Disp: 15 mL, Rfl: 0   lurasidone  (LATUDA ) 20 MG TABS tablet, Take 1 tablet (20 mg total) by mouth daily with supper., Disp: 90 tablet, Rfl: 1   magnesium  oxide (MAG-OX) 400 MG tablet, Take 1,000 mg by mouth 2 (two) times daily., Disp: , Rfl:    metFORMIN  (GLUCOPHAGE ) 1000 MG tablet, Take 1 tablet (1,000 mg total) by mouth 2 (two) times daily with a meal., Disp: 180 tablet, Rfl: 0   mirabegron  ER (MYRBETRIQ ) 50 MG TB24 tablet, Take 1 tablet (50 mg total) by mouth daily., Disp: 90 tablet, Rfl: 3   mirtazapine  (REMERON ) 15 MG tablet, TAKE 1 TABLET BY MOUTH AT BEDTIME, Disp: 90 tablet, Rfl: 11   NOVOLOG  FLEXPEN 100 UNIT/ML FlexPen, Inject 40 Units into the skin 3 (three) times daily with meals. Hold if not  eating at least half of a full meal. (Patient taking differently: Inject 9 Units into the skin 3 (three) times daily with meals. Add additional unit depending on what glucose is), Disp: 15 mL, Rfl: 11   ondansetron  (ZOFRAN ) 4 MG tablet, Take 1 tablet (4 mg total) by mouth every 6 (six) hours as needed for nausea., Disp: 20 tablet, Rfl: 0   oxyCODONE  (OXY IR/ROXICODONE ) 5 MG immediate release tablet, Take 5 mg by mouth 3 (three) times daily as needed., Disp: , Rfl:    pantoprazole  (PROTONIX ) 40 MG tablet, Take 1 tablet (40 mg total) by mouth daily. Recommend using while on Prednisone ., Disp: 30 tablet, Rfl: 0   PRECISION QID TEST test strip, , Disp: , Rfl:    pregabalin  (LYRICA ) 200 MG capsule, Take 1 capsule (200 mg total) by mouth 2 (two) times daily., Disp: 60 capsule, Rfl: 0   rosuvastatin  (CRESTOR ) 10 MG tablet, TAKE 1 TABLET BY MOUTH ONCE DAILY, Disp: 90 tablet, Rfl: 3   Semaglutide , 2 MG/DOSE, 8 MG/3ML SOPN, Inject 2 mg as directed once a week., Disp: 9 mL, Rfl: 1   SURE COMFORT PEN NEEDLES 32G X 6 MM MISC, USE AS DIRECTED, Disp: 100 each, Rfl: 11   tacrolimus  (PROGRAF ) 1 MG capsule, Take 1 capsule by mouth every morning., Disp: , Rfl:    UBRELVY  50 MG TABS, Take 1 tablet by mouth daily as needed., Disp: , Rfl:  venlafaxine  XR (EFFEXOR -XR) 37.5 MG 24 hr capsule, Take 1 capsule (37.5 mg total) by mouth every morning. Take along with 75 mg daily, Disp: 30 capsule, Rfl: 5   venlafaxine  XR (EFFEXOR -XR) 75 MG 24 hr capsule, Take 1 capsule (75 mg total) by mouth daily with breakfast. Take along with 37.5 mg daily, Disp: 30 capsule, Rfl: 10   lisinopril  (ZESTRIL ) 20 MG tablet, Take 1 tablet (20 mg total) by mouth daily. Not paused (Patient not taking: Reported on 08/10/2024), Disp: 90 tablet, Rfl: 0   naloxone  (NARCAN ) nasal spray 4 mg/0.1 mL, Place 0.4 mg into the nose once., Disp: , Rfl:   Physical exam:  Vitals:   08/10/24 1353  BP: (!) 138/95  Pulse: 98  Resp: 19  Temp: 98.4 F (36.9 C)   TempSrc: Tympanic  SpO2: 96%  Weight: 253 lb 12.8 oz (115.1 kg)  Height: 5' 6 (1.676 m)   Physical Exam Cardiovascular:     Rate and Rhythm: Normal rate and regular rhythm.     Heart sounds: Normal heart sounds.  Pulmonary:     Effort: Pulmonary effort is normal.     Breath sounds: Normal breath sounds.  Abdominal:     General: Bowel sounds are normal.     Palpations: Abdomen is soft.  Lymphadenopathy:     Comments: No palpable cervical, supraclavicular, axillary or inguinal adenopathy    Skin:    General: Skin is warm and dry.  Neurological:     Mental Status: She is alert and oriented to person, place, and time.      I have personally reviewed labs listed below:    Latest Ref Rng & Units 07/27/2024    5:53 AM  CMP  Glucose 70 - 99 mg/dL 840   BUN 6 - 20 mg/dL 17   Creatinine 9.55 - 1.00 mg/dL 9.10   Sodium 864 - 854 mmol/L 139   Potassium 3.5 - 5.1 mmol/L 3.8   Chloride 98 - 111 mmol/L 103   CO2 22 - 32 mmol/L 19   Calcium  8.9 - 10.3 mg/dL 8.6       Latest Ref Rng & Units 07/27/2024    5:53 AM  CBC  WBC 4.0 - 10.5 K/uL 7.3   Hemoglobin 12.0 - 15.0 g/dL 89.9   Hematocrit 63.9 - 46.0 % 31.5   Platelets 150 - 400 K/uL 137    I have personally reviewed Radiology images listed below: No images are attached to the encounter.  CT Angio Chest PE W and/or Wo Contrast Result Date: 07/27/2024 EXAM: CTA CHEST 07/27/2024 07:12:20 AM TECHNIQUE: CTA of the chest was performed without and with the administration of 75 mL of intravenous contrast (iohexol  (OMNIPAQUE ) 350 MG/ML injection 75 mL IOHEXOL  350 MG/ML SOLN). Multiplanar reformatted images are provided for review. MIP images are provided for review. Automated exposure control, iterative reconstruction, and/or weight based adjustment of the mA/kV was utilized to reduce the radiation dose to as low as reasonably achievable. COMPARISON: 03/29/2024 lad coronary artery calcification. CLINICAL HISTORY: Pulmonary embolism  (PE) suspected, high prob. Pt st since Wed having swelling to left leg; was seen by her PCP with negative findings but symptoms persist; no redness noted with palpation; skin W\T\D, +PP FINDINGS: PULMONARY ARTERIES: Pulmonary arteries are adequately opacified for evaluation. No acute pulmonary embolus. Main pulmonary artery is normal in caliber. MEDIASTINUM: Heart size within upper limits of normal. No pericardial effusion. There is no acute abnormality of the thoracic aorta. Left chest wall port-a-catheter noted  with tip in the superior cavoatrial junction. LYMPH NODES: Calcified mediastinal lymph nodes identified. No hilar or axillary lymphadenopathy. LUNGS AND PLEURA: The lungs are hypoinflated. Mild dependent changes are identified within the posterior lung bases. No focal consolidation or pulmonary edema. No evidence of pleural effusion or pneumothorax. UPPER ABDOMEN: Limited images of the upper abdomen are unremarkable. SOFT TISSUES AND BONES: No acute bone or soft tissue abnormality. IMPRESSION: 1. No pulmonary embolism. 2. Low lung volumes with dependent changes in the lung bases. 3. Lad coronary artery calcifications. Electronically signed by: Waddell Calk MD 07/27/2024 07:48 AM EDT RP Workstation: [id number]   US  Venous Img Lower Unilateral Left Result Date: 07/27/2024 EXAM: ULTRASOUND DUPLEX OF THE LEFT LOWER EXTREMITY VEINS TECHNIQUE: Duplex ultrasound using B-mode/gray scaled imaging and Doppler spectral analysis and color flow was obtained of the deep venous structures of the left lower extremity. COMPARISON: 07/08/2024 CLINICAL HISTORY: 48 year old female with worsening left lower extremity swelling. FINDINGS: The common femoral vein, femoral vein, popliteal vein, and visible calf veins of the left lower extremity demonstrate normal compressibility with normal color flow and spectral analysis. New crescent shaped nonvascular fluid collection identified in the popliteal fossa encompassing 32 x 48  x 14 mm, compatible with a Baker cyst. IMPRESSION: 1. No evidence of left lower extremity DVT. 2. 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 Electronically signed by: Helayne Hurst MD 07/27/2024 06:49 AM EDT RP Workstation: [id number]   DG Ankle Complete Left Result Date: 07/27/2024 EXAM: 3 OR MORE VIEW(S) XRAY OF THE LEFT ANKLE 07/27/2024 04:49:11 AM CLINICAL HISTORY: worsening LLE swelling. Worsening LLE swelling COMPARISON: 06/04/2024 FINDINGS: BONES AND JOINTS: Unchanged tiny avulsion fracture at medial malleolus. Small superior and plantar calcaneal spurs. No focal osseous lesion. No joint dislocation. SOFT TISSUES: Soft tissue swelling. IMPRESSION: 1. Soft tissue swelling 2. Unchanged appearance of suspected avulsion fracture of the medial malleolus as described on 06/04/2024 Electronically signed by: Waddell Calk MD 07/27/2024 05:43 AM EDT RP Workstation: [id number]     Assessment and plan- Patient is a 48 y.o. female with prior history of diffuse large B-cell lymphoma and most recently a marginal zone lymphoma involving the parotid gland s/p 4 cycles of Rituxan  in September 2020.  She is here for routine follow-up  Clinically patient is doing well with no concerning signs and symptoms of recurrence based on today's exam.  Patient And has had multiple CT scans in the last 6 months during her hospitalizations which have not shown any evidence of recurrent lymphoma.  I will see her back in 6 months with scans prior     Visit Diagnosis 1. Encounter for follow-up surveillance of lymphoma   2. MGUS (monoclonal gammopathy of unknown significance)      Dr. Annah Skene, MD, MPH [facility] [phone] 08/10/2024 2:39 PM

## 2024-08-10 NOTE — Progress Notes (Signed)
 Patient states her strength isn't the best anymore and that she is only able to shower instead of taking baths due to not being able to get up when she sits down.

## 2024-08-11 ENCOUNTER — Encounter: Payer: Self-pay | Admitting: Oncology

## 2024-08-16 DIAGNOSIS — M79606 Pain in leg, unspecified: Secondary | ICD-10-CM | POA: Diagnosis not present

## 2024-08-16 DIAGNOSIS — M5414 Radiculopathy, thoracic region: Secondary | ICD-10-CM | POA: Diagnosis not present

## 2024-08-16 DIAGNOSIS — M79669 Pain in unspecified lower leg: Secondary | ICD-10-CM | POA: Diagnosis not present

## 2024-08-16 DIAGNOSIS — G893 Neoplasm related pain (acute) (chronic): Secondary | ICD-10-CM | POA: Diagnosis not present

## 2024-08-16 DIAGNOSIS — Z79891 Long term (current) use of opiate analgesic: Secondary | ICD-10-CM | POA: Diagnosis not present

## 2024-08-16 DIAGNOSIS — M5442 Lumbago with sciatica, left side: Secondary | ICD-10-CM | POA: Diagnosis not present

## 2024-08-16 DIAGNOSIS — M25519 Pain in unspecified shoulder: Secondary | ICD-10-CM | POA: Diagnosis not present

## 2024-08-16 DIAGNOSIS — G894 Chronic pain syndrome: Secondary | ICD-10-CM | POA: Diagnosis not present

## 2024-08-16 DIAGNOSIS — F3189 Other bipolar disorder: Secondary | ICD-10-CM | POA: Diagnosis not present

## 2024-08-16 DIAGNOSIS — M79603 Pain in arm, unspecified: Secondary | ICD-10-CM | POA: Diagnosis not present

## 2024-08-16 DIAGNOSIS — I10 Essential (primary) hypertension: Secondary | ICD-10-CM | POA: Diagnosis not present

## 2024-08-23 ENCOUNTER — Telehealth

## 2024-08-23 ENCOUNTER — Telehealth: Payer: Self-pay

## 2024-08-24 ENCOUNTER — Ambulatory Visit: Attending: Nurse Practitioner

## 2024-08-25 ENCOUNTER — Other Ambulatory Visit: Payer: Self-pay | Admitting: "Endocrinology

## 2024-08-25 DIAGNOSIS — E1122 Type 2 diabetes mellitus with diabetic chronic kidney disease: Secondary | ICD-10-CM

## 2024-08-26 DIAGNOSIS — F4310 Post-traumatic stress disorder, unspecified: Secondary | ICD-10-CM | POA: Diagnosis not present

## 2024-08-26 DIAGNOSIS — F411 Generalized anxiety disorder: Secondary | ICD-10-CM | POA: Diagnosis not present

## 2024-08-27 ENCOUNTER — Telehealth: Payer: Self-pay | Admitting: Nurse Practitioner

## 2024-08-27 ENCOUNTER — Other Ambulatory Visit

## 2024-08-27 NOTE — Telephone Encounter (Signed)
 Copied from CRM #[id number]. Topic: Clinical - Medication Refill >> Aug 27, 2024  1:38 PM Yolanda T wrote: Medication: Accu-Chek Test Strips  Has the patient contacted their pharmacy? No  This is the patient's preferred pharmacy:   [facility] [address] [place] - [address] [place] [address] Phone: [phone] Fax: [phone]  Is this the correct pharmacy for this prescription? Yes  Has the prescription been filled recently? Yes  Is the patient out of the medication? No  Has the patient been seen for an appointment in the last year OR does the patient have an upcoming appointment? Yes  Can we respond through MyChart? Yes  Agent: Please be advised that Rx refills may take up to 3 business days. We ask that you follow-up with your pharmacy.

## 2024-08-27 NOTE — Telephone Encounter (Signed)
Not on current med list, unable to pend.

## 2024-08-27 NOTE — Telephone Encounter (Signed)
 Pt notified to contact endo

## 2024-08-30 ENCOUNTER — Encounter: Payer: Self-pay | Admitting: "Endocrinology

## 2024-08-30 ENCOUNTER — Other Ambulatory Visit: Payer: Self-pay | Admitting: "Endocrinology

## 2024-08-30 DIAGNOSIS — N184 Chronic kidney disease, stage 4 (severe): Secondary | ICD-10-CM

## 2024-08-30 MED ORDER — ACCU-CHEK GUIDE TEST VI STRP: ORAL_STRIP | 12 refills | Status: DC

## 2024-09-01 ENCOUNTER — Inpatient Hospital Stay: Payer: Medicaid Other

## 2024-09-03 ENCOUNTER — Other Ambulatory Visit: Payer: Self-pay

## 2024-09-03 NOTE — Patient Outreach (Signed)
 Complex Care Management   Visit Note  09/03/2024  Name:  Alison [name] MRN: [id number] DOB: 1976-08-23  Situation: Referral received for Complex Care Management related to Diabetes with Complications I obtained verbal consent from Patient.  Visit completed with Patient  on the phone  Background:   Past Medical History:  Diagnosis Date   Abnormal uterine bleeding    Allergy    Anxiety    Arthritis    Bipolar disorder (manic depression) (HCC)    Cancer (HCC) Diffuse B-Cell Lymphoma   2015   Chest pain    a. 09/2021 MV: Low risk without ischemia or infarct.  Normal EF.   Chronic kidney failure    Chronic renal disease, stage III (HCC)    Diabetes mellitus without complication (HCC)    DLBCL (diffuse large B cell lymphoma) (HCC) 2015   Right axillary lymph node resected and chemo tx's.   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 02/2023 Echo: EF 55-60%, no rwma, nl RV size/fxn. Mildly dil LA.   Personality disorder (HCC)    Pineal gland cyst    Post herpetic neuralgia    PTSD (post-traumatic stress disorder)    RBBB    Renal disorder    S/P liver transplant (HCC) 1993   Sleep apnea    has not recieved cpap yet-other one was recalled    Assessment: Patient Reported Symptoms:  Cognitive Cognitive Status: No symptoms reported Cognitive/Intellectual Conditions Management [RPT]: None reported or documented in medical history or problem list   Health Maintenance Behaviors: Annual physical exam  Neurological Neurological Review of Symptoms: Numbness,  Headaches Neurological Management Strategies: Medication therapy, Routine screening Neurological Comment: pain behond right eye with illnees, numbness in feet, Left > right  HEENT HEENT Symptoms Reported: Other: HEENT Management Strategies: Routine screening HEENT Comment: sore throat with respiratory illness    Cardiovascular   Cardiovascular Management Strategies: Medication therapy, Diet modification Cardiovascular Comment: swelling and doscomfor pn LLE after walking relieved with elevation, no redness or heat  spoke with PCP and advised to limit weight on left foot, advised to use walker/rollato instead of cane, reports tendon pulled loose from bone taking piece of bone with it,  red flags for ED/UC  Respiratory Other Respiratory Symptoms: 4 days respiratory illness, nasal congestion, occasional sneeze rare cough, no increase SOB or wheeze, has inhaler and nasal spray as needed, noted PND, recommended warm salt water gargle prn, sx to call provider and red flags for ED given Respiratory Management Strategies: Routine screening, Medication therapy  Endocrine Endocrine Symptoms Reported: No symptoms reported Is patient diabetic?: Yes Is patient checking blood sugars at home?: Yes List most recent blood sugar readings, include date and time of day: FBG ranging 97-120, did not yet check today, no sx of high/low Endocrine Self-Management Outcome: 4 (good)  Gastrointestinal Gastrointestinal Symptoms Reported: No symptoms reported      Genitourinary Genitourinary Symptoms Reported: No symptoms  reported    Integumentary Integumentary Symptoms Reported: No symptoms reported    Musculoskeletal Musculoskelatal Symptoms Reviewed: Difficulty walking, Joint pain, Limited mobility Other Musculoskeletal Symptoms: electric wheelchair, chronic fatigue/weakness, no new falls, LLE pain from tendon issue pain 2-3/10 Musculoskeletal Management Strategies: Medication therapy, Routine screening, Medical  device Falls in the past year?: Yes Number of falls in past year: 2 or more Was there an injury with Fall?: Yes Fall Risk Category Calculator: 3 Patient Fall Risk Level: High Fall Risk Patient at Risk for Falls Due to: History of fall(s), Impaired mobility Fall risk Follow up: Falls evaluation completed, Falls prevention discussed  Psychosocial Psychosocial Symptoms Reported: No symptoms reported Additional Psychological Details: doing ok today - needs computer back up to do specific crafts, counseling continues, taking meds as orderd Behavioral Management Strategies: Medication therapy, Counseling Behavioral Health Comment: service dog Major Change/Loss/Stressor/Fears (CP): Medical condition, self Techniques to Cope with Loss/Stress/Change: Diversional activities, Counseling      09/03/2024    PHQ2-9 Depression Screening   Little interest or pleasure in doing things Several days  Feeling down, depressed, or hopeless Several days  PHQ-2 - Total Score 2  Trouble falling or staying asleep, or sleeping too much Several days  Feeling tired or having little energy Several days  Poor appetite or overeating  Not at all  Feeling bad about yourself - or that you are a failure or have let yourself or your family down Not at all  Trouble concentrating on things, such as reading the newspaper or watching television Several days  Moving or speaking so slowly that other people could have noticed.  Or the opposite - being so fidgety or restless that you have been moving around a lot more than usual Not at all  Thoughts that you would be better off dead, or hurting yourself in some way Not at all  PHQ2-9 Total Score 5  If you checked off any problems, how difficult have these problems made it for you to do your work, take care of things at home, or get along with other people    Depression Interventions/Treatment      There were no vitals filed for this visit.    Medications Reviewed Today      Reviewed by Devra Lands, RN (Registered Nurse) on 09/03/24 at 1016  Med List Status: <None>   Medication Order Taking? Sig Documenting Provider Last Dose Status Informant  Accu-Chek Softclix Lancets lancets [id number] Yes 2 (two) times daily. [provider]  Active   albuterol  (VENTOLIN  HFA) 108 (90 Base) MCG/ACT inhaler [id number] Yes Inhale 2 puffs into the lungs every 6 (six) hours as needed for wheezing or shortness of breath. Gareth Mliss FALCON, FNP  Active Self           Med Note [facility], ELIZABETH A   Mon Mar 15, 2024 10:57 AM) PRN  aspirin  EC 81 MG tablet [id number] Yes Take 1 tablet (81 mg total) by mouth daily. Swallow whole. Darliss Rogue, MD  Active   baclofen  (LIORESAL ) 10 MG tablet [id number] Yes Take 1 tablet by mouth 2 (two) times daily. [provider]  Active Self  benztropine  (COGENTIN ) 1 MG tablet [id number] Yes TAKE 1 TABLET BY MOUTH ONCE DAILY AS NEEDED FOR TREMORS [name], [name], MD  Active Self  Continuous Glucose Sensor (DEXCOM G7 SENSOR) MISC [id number] Yes 1 Device by Does not apply route continuous. Motwani, Komal, MD  Active   Continuous Glucose Sensor (FREESTYLE LIBRE 3 PLUS SENSOR) [place] [id number] Yes  by Does not apply route. Change sensor every 15 days. [provider]  Active   fluticasone  (FLONASE ) 50 MCG/ACT nasal spray [id number] Yes Place into the nose. [provider]  Active Self           Med Note SIGRID, TIFFANY N   Sun Apr 11, 2024 11:59 PM) PRN  glipiZIDE  (GLUCOTROL  XL) 10 MG 24 hr tablet [id number] Yes TAKE 1 TABLET BY MOUTH IN THE MORNING AND AT BEDTIME Motwani, Komal, MD  Active Self  glucose blood (ACCU-CHEK GUIDE TEST) test strip [id number] Yes 1 each by Other route 2 (two) times daily. Motwani, Komal, MD  Active   glucose blood (RELION GLUCOSE TEST STRIPS) test strip [id number] Yes USE 1  TO CHECK GLUCOSE THREE TIMES DAILY Motwani, Komal, MD  Active   insulin  glargine (LANTUS  SOLOSTAR) 100 UNIT/ML Solostar Pen  [id number] Yes Inject 40 Units into the skin 2 (two) times daily.  Patient taking differently: Inject 40 Units into the skin daily. Taking 40 units at night before bed   Fausto Sor A, DO  Active   lisinopril  (ZESTRIL ) 20 MG tablet [id number]  Take 1 tablet (20 mg total) by mouth daily. Not paused  Patient not taking: Reported on 08/10/2024   Pender, Julie F, FNP  Active   lurasidone  (LATUDA ) 20 MG TABS tablet [id number] Yes Take 1 tablet (20 mg total) by mouth daily with supper. [name], [name], MD  Active   magnesium  oxide (MAG-OX) 400 MG tablet [id number] Yes Take 1,000 mg by mouth 2 (two) times daily. [provider]  Active Self  metFORMIN  (GLUCOPHAGE ) 1000 MG tablet [id number] Yes TAKE ONE (1) TABLET BY MOUTH TWICE DAILY WITH MEALS Motwani, Komal, MD  Active   mirabegron  ER (MYRBETRIQ ) 50 MG TB24 tablet [id number] Yes Take 1 tablet (50 mg total) by mouth daily. Gareth Mliss FALCON, FNP  Active Self  mirtazapine  (REMERON ) 15 MG tablet [id number] Yes TAKE 1 TABLET BY MOUTH AT BEDTIME [name], [name], MD  Active   naloxone  (NARCAN ) nasal spray 4 mg/0.1 mL [id number] Yes Place 0.4 mg into the nose once. [provider]  Active Self           Med Note GROVER, SOR S   Wed Oct 15, 2023  1:51 PM) prn  NOVOLOG  FLEXPEN 100 UNIT/ML FlexPen [id number] Yes Inject 40 Units into the skin 3 (three) times daily with meals. Hold if not eating at least half of a full meal.  Patient taking differently: Inject 9 Units into the skin 3 (three) times daily with meals. Add additional unit depending on what glucose is   Fausto Sor LABOR, DO  Active   ondansetron  (ZOFRAN ) 4 MG tablet [id number] Yes Take 1 tablet (4 mg total) by mouth every 6 (six) hours as needed for nausea. Caleen Qualia, MD  Active Self  oxyCODONE  (OXY IR/ROXICODONE ) 5 MG immediate release tablet [id number] Yes Take 5 mg by mouth 3 (three) times daily as needed. [provider]  Active Self  pantoprazole  (PROTONIX ) 40 MG tablet  [id number] Yes Take 1 tablet (40 mg total) by mouth daily. Recommend using while on Prednisone . Fausto Sor A, DO  Active   PRECISION QID TEST test strip [id number] Yes  [provider]  Active   pregabalin  (LYRICA ) 200 MG capsule [id number] Yes Take 1 capsule (200 mg total) by mouth 2 (two) times daily. Floy Roberts, MD  Active Self  rosuvastatin  (CRESTOR ) 10 MG tablet [id number] Yes TAKE 1 TABLET BY MOUTH ONCE DAILY Darryle,  Thom CROME, PA-C  Active   Semaglutide , 2 MG/DOSE, 8 MG/3ML SOPN [id number] Yes Inject 2 mg as directed once a week. Dartha Ernst, MD  Active            Med Note ZENA NATHANAEL CROME   Fri Jun 04, 2024  8:41 PM) Usually takes on Friday  SURE COMFORT PEN NEEDLES 32G X 6 MM MISC [id number] Yes USE AS DIRECTED Pender, Julie F, FNP  Active   tacrolimus  (PROGRAF ) 1 MG capsule [id number] Yes Take 1 capsule by mouth every morning. [provider]  Active Self  UBRELVY  50 MG TABS [id number] Yes Take 1 tablet by mouth daily as needed. [provider]  Active Self  venlafaxine  XR (EFFEXOR -XR) 37.5 MG 24 hr capsule [id number] Yes Take 1 capsule (37.5 mg total) by mouth every morning. Take along with 75 mg daily [name], [name], MD  Active   venlafaxine  XR (EFFEXOR -XR) 75 MG 24 hr capsule [id number] Yes Take 1 capsule (75 mg total) by mouth daily with breakfast. Take along with 37.5 mg daily [name], [name], MD  Active             Recommendation:   PCP Follow-up Continue Current Plan of Care  Follow Up Plan:   Telephone follow-up in 1 month  [name], MSN, RN [facility]  [facility], [facility] RN Care Manager Direct Dial: [phone] Fax: [phone]

## 2024-09-03 NOTE — Patient Outreach (Deleted)
 Complex Care Management   Visit Note  09/03/2024  Name:  Alison [name] MRN: [id number] DOB: 1976-08-23  Situation: Referral received for Complex Care Management related to Diabetes with Complications I obtained verbal consent from Patient.  Visit completed with Patient  on the phone  Background:   Past Medical History:  Diagnosis Date   Abnormal uterine bleeding    Allergy    Anxiety    Arthritis    Bipolar disorder (manic depression) (HCC)    Cancer (HCC) Diffuse B-Cell Lymphoma   2015   Chest pain    a. 09/2021 MV: Low risk without ischemia or infarct.  Normal EF.   Chronic kidney failure    Chronic renal disease, stage III (HCC)    Diabetes mellitus without complication (HCC)    DLBCL (diffuse large B cell lymphoma) (HCC) 2015   Right axillary lymph node resected and chemo tx's.   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 02/2023 Echo: EF 55-60%, no rwma, nl RV size/fxn. Mildly dil LA.   Personality disorder (HCC)    Pineal gland cyst    Post herpetic neuralgia    PTSD (post-traumatic stress disorder)    RBBB    Renal disorder    S/P liver transplant (HCC) 1993   Sleep apnea    has not recieved cpap yet-other one was recalled    Assessment: Patient Reported Symptoms:  Cognitive Cognitive Status: No symptoms reported Cognitive/Intellectual Conditions Management [RPT]: None reported or documented in medical history or problem list   Health Maintenance Behaviors: Annual physical exam  Neurological Neurological Review of Symptoms: Numbness,  Headaches Neurological Management Strategies: Medication therapy, Routine screening Neurological Comment: pain behond right eye with illnees, numbness in feet, Left > right  HEENT HEENT Symptoms Reported: Other: HEENT Management Strategies: Routine screening HEENT Comment: sore throat with respiratory illness    Cardiovascular   Cardiovascular Management Strategies: Medication therapy, Diet modification Cardiovascular Comment: swelling and doscomfor pn LLE after walking relieved with elevation, no redness or heat  spoke with PCP and advised to limit weight on left foot, advised to use walker/rollato instead of cane, reports tendon pulled loose from bone taking piece of bone with it,  red flags for ED/UC  Respiratory Other Respiratory Symptoms: 4 days respiratory illness, nasal congestion, occasional sneeze rare cough, no increase SOB or wheeze, has inhaler and nasal spray as needed, noted PND, recommended warm salt water gargle prn, sx to call provider and red flags for ED given Respiratory Management Strategies: Routine screening, Medication therapy  Endocrine Endocrine Symptoms Reported: No symptoms reported Is patient diabetic?: Yes Is patient checking blood sugars at home?: Yes List most recent blood sugar readings, include date and time of day: FBG ranging 97-120, did not yet check today, no sx of high/low Endocrine Self-Management Outcome: 4 (good)  Gastrointestinal Gastrointestinal Symptoms Reported: No symptoms reported      Genitourinary Genitourinary Symptoms Reported: No symptoms  reported    Integumentary Integumentary Symptoms Reported: No symptoms reported    Musculoskeletal Musculoskelatal Symptoms Reviewed: Difficulty walking, Joint pain, Limited mobility Other Musculoskeletal Symptoms: electric wheelchair, chronic fatigue/weakness, no new falls, LLE pain from tendon issue pain 2-3/10 Musculoskeletal Management Strategies: Medication therapy, Routine screening, Medical  device Falls in the past year?: Yes Number of falls in past year: 2 or more Was there an injury with Fall?: Yes Fall Risk Category Calculator: 3 Patient Fall Risk Level: High Fall Risk Patient at Risk for Falls Due to: History of fall(s), Impaired mobility Fall risk Follow up: Falls evaluation completed, Falls prevention discussed  Psychosocial Psychosocial Symptoms Reported: No symptoms reported Additional Psychological Details: doing ok today - needs computer back up to do specific crafts, counseling continues, taking meds as orderd Behavioral Management Strategies: Medication therapy, Counseling Behavioral Health Comment: service dog Major Change/Loss/Stressor/Fears (CP): Medical condition, self Techniques to Cope with Loss/Stress/Change: Diversional activities, Counseling      09/03/2024    PHQ2-9 Depression Screening   Little interest or pleasure in doing things Several days  Feeling down, depressed, or hopeless Several days  PHQ-2 - Total Score 2  Trouble falling or staying asleep, or sleeping too much Several days  Feeling tired or having little energy Several days  Poor appetite or overeating  Not at all  Feeling bad about yourself - or that you are a failure or have let yourself or your family down Not at all  Trouble concentrating on things, such as reading the newspaper or watching television Several days  Moving or speaking so slowly that other people could have noticed.  Or the opposite - being so fidgety or restless that you have been moving around a lot more than usual Not at all  Thoughts that you would be better off dead, or hurting yourself in some way Not at all  PHQ2-9 Total Score 5  If you checked off any problems, how difficult have these problems made it for you to do your work, take care of things at home, or get along with other people    Depression Interventions/Treatment      There were no vitals filed for this visit.    Medications Reviewed Today      Reviewed by Devra Lands, RN (Registered Nurse) on 09/03/24 at 1016  Med List Status: <None>   Medication Order Taking? Sig Documenting Provider Last Dose Status Informant  Accu-Chek Softclix Lancets lancets [id number] Yes 2 (two) times daily. [provider]  Active   albuterol  (VENTOLIN  HFA) 108 (90 Base) MCG/ACT inhaler [id number] Yes Inhale 2 puffs into the lungs every 6 (six) hours as needed for wheezing or shortness of breath. Gareth Mliss FALCON, FNP  Active Self           Med Note [facility], ELIZABETH A   Mon Mar 15, 2024 10:57 AM) PRN  aspirin  EC 81 MG tablet [id number] Yes Take 1 tablet (81 mg total) by mouth daily. Swallow whole. Darliss Rogue, MD  Active   baclofen  (LIORESAL ) 10 MG tablet [id number] Yes Take 1 tablet by mouth 2 (two) times daily. [provider]  Active Self  benztropine  (COGENTIN ) 1 MG tablet [id number] Yes TAKE 1 TABLET BY MOUTH ONCE DAILY AS NEEDED FOR TREMORS [name], [name], MD  Active Self  Continuous Glucose Sensor (DEXCOM G7 SENSOR) MISC [id number] Yes 1 Device by Does not apply route continuous. Motwani, Komal, MD  Active   Continuous Glucose Sensor (FREESTYLE LIBRE 3 PLUS SENSOR) [place] [id number] Yes  by Does not apply route. Change sensor every 15 days. [provider]  Active   fluticasone  (FLONASE ) 50 MCG/ACT nasal spray [id number] Yes Place into the nose. [provider]  Active Self           Med Note SIGRID, TIFFANY N   Sun Apr 11, 2024 11:59 PM) PRN  glipiZIDE  (GLUCOTROL  XL) 10 MG 24 hr tablet [id number] Yes TAKE 1 TABLET BY MOUTH IN THE MORNING AND AT BEDTIME Motwani, Komal, MD  Active Self  glucose blood (ACCU-CHEK GUIDE TEST) test strip [id number] Yes 1 each by Other route 2 (two) times daily. Motwani, Komal, MD  Active   glucose blood (RELION GLUCOSE TEST STRIPS) test strip [id number] Yes USE 1  TO CHECK GLUCOSE THREE TIMES DAILY Motwani, Komal, MD  Active   insulin  glargine (LANTUS  SOLOSTAR) 100 UNIT/ML Solostar Pen  [id number] Yes Inject 40 Units into the skin 2 (two) times daily.  Patient taking differently: Inject 40 Units into the skin daily. Taking 40 units at night before bed   Fausto Sor A, DO  Active   lisinopril  (ZESTRIL ) 20 MG tablet [id number]  Take 1 tablet (20 mg total) by mouth daily. Not paused  Patient not taking: Reported on 08/10/2024   Pender, Julie F, FNP  Active   lurasidone  (LATUDA ) 20 MG TABS tablet [id number] Yes Take 1 tablet (20 mg total) by mouth daily with supper. [name], [name], MD  Active   magnesium  oxide (MAG-OX) 400 MG tablet [id number] Yes Take 1,000 mg by mouth 2 (two) times daily. [provider]  Active Self  metFORMIN  (GLUCOPHAGE ) 1000 MG tablet [id number] Yes TAKE ONE (1) TABLET BY MOUTH TWICE DAILY WITH MEALS Motwani, Komal, MD  Active   mirabegron  ER (MYRBETRIQ ) 50 MG TB24 tablet [id number] Yes Take 1 tablet (50 mg total) by mouth daily. Gareth Mliss FALCON, FNP  Active Self  mirtazapine  (REMERON ) 15 MG tablet [id number] Yes TAKE 1 TABLET BY MOUTH AT BEDTIME [name], [name], MD  Active   naloxone  (NARCAN ) nasal spray 4 mg/0.1 mL [id number] Yes Place 0.4 mg into the nose once. [provider]  Active Self           Med Note GROVER, SOR S   Wed Oct 15, 2023  1:51 PM) prn  NOVOLOG  FLEXPEN 100 UNIT/ML FlexPen [id number] Yes Inject 40 Units into the skin 3 (three) times daily with meals. Hold if not eating at least half of a full meal.  Patient taking differently: Inject 9 Units into the skin 3 (three) times daily with meals. Add additional unit depending on what glucose is   Fausto Sor LABOR, DO  Active   ondansetron  (ZOFRAN ) 4 MG tablet [id number] Yes Take 1 tablet (4 mg total) by mouth every 6 (six) hours as needed for nausea. Caleen Qualia, MD  Active Self  oxyCODONE  (OXY IR/ROXICODONE ) 5 MG immediate release tablet [id number] Yes Take 5 mg by mouth 3 (three) times daily as needed. [provider]  Active Self  pantoprazole  (PROTONIX ) 40 MG tablet  [id number] Yes Take 1 tablet (40 mg total) by mouth daily. Recommend using while on Prednisone . Fausto Sor A, DO  Active   PRECISION QID TEST test strip [id number] Yes  [provider]  Active   pregabalin  (LYRICA ) 200 MG capsule [id number] Yes Take 1 capsule (200 mg total) by mouth 2 (two) times daily. Floy Roberts, MD  Active Self  rosuvastatin  (CRESTOR ) 10 MG tablet [id number] Yes TAKE 1 TABLET BY MOUTH ONCE DAILY Darryle,  Thom CROME, PA-C  Active   Semaglutide , 2 MG/DOSE, 8 MG/3ML SOPN [id number] Yes Inject 2 mg as directed once a week. Dartha Ernst, MD  Active            Med Note ZENA NATHANAEL CROME   Fri Jun 04, 2024  8:41 PM) Usually takes on Friday  SURE COMFORT PEN NEEDLES 32G X 6 MM MISC [id number] Yes USE AS DIRECTED Pender, Julie F, FNP  Active   tacrolimus  (PROGRAF ) 1 MG capsule [id number] Yes Take 1 capsule by mouth every morning. [provider]  Active Self  UBRELVY  50 MG TABS [id number] Yes Take 1 tablet by mouth daily as needed. [provider]  Active Self  venlafaxine  XR (EFFEXOR -XR) 37.5 MG 24 hr capsule [id number] Yes Take 1 capsule (37.5 mg total) by mouth every morning. Take along with 75 mg daily [name], [name], MD  Active   venlafaxine  XR (EFFEXOR -XR) 75 MG 24 hr capsule [id number] Yes Take 1 capsule (75 mg total) by mouth daily with breakfast. Take along with 37.5 mg daily [name], [name], MD  Active             Recommendation:   PCP Follow-up Continue Current Plan of Care  Follow Up Plan:   Telephone follow-up in 1 month  [name], MSN, RN [facility]  [facility] RN Care Manager Direct Dial: [phone] Fax: [phone]

## 2024-09-03 NOTE — Patient Instructions (Signed)
 Visit Information  [name] was given information about Medicaid Managed Care team care coordination services as a part of their Healthy [facility] Medicaid benefit. AUDRIS SPEAKER   If you would like to schedule transportation through your Healthy [facility] plan, please call the following number at least 2 days in advance of your appointment: [phone]  For information about your ride after you set it up, call Ride Assist at [phone]. Use this number to activate a Will Call pickup, or if your transportation is late for a scheduled pickup. Use this number, too, if you need to make a change or cancel a previously scheduled reservation.  If you need transportation services right away, call [phone]. The after-hours call center is staffed 24 hours to handle ride assistance and urgent reservation requests (including discharges) 365 days a year. Urgent trips include sick visits, hospital discharge requests and life-sustaining treatment.  Call the [facility] Line at [phone], at any time, 24 hours a day, 7 days a week. If you are in danger or need immediate medical attention call 911.   Please see education materials related to None provided by MyChart link.  Care plan and visit instructions communicated with the patient verbally today. Patient agrees to receive a copy in MyChart. Active MyChart status and patient understanding of how to access instructions and care plan via MyChart confirmed with patient.     Telephone follow up appointment with Managed Medicaid care management team member scheduled for: 10/08/2024 at 10:00 am  [name], MSN, RN [facility], [facility] RN Care Manager Direct Dial: [phone] Fax: [phone]    Following is a copy of your plan of care:  There are no care plans that you recently modified to display for this patient.

## 2024-09-08 ENCOUNTER — Inpatient Hospital Stay: Attending: Oncology

## 2024-09-08 DIAGNOSIS — D472 Monoclonal gammopathy: Secondary | ICD-10-CM | POA: Diagnosis not present

## 2024-09-08 DIAGNOSIS — C8841 Extranodal marginal zone b-cell lymphoma of mucosa-associated lymphoid tissue (malt-lymphoma), in remission: Secondary | ICD-10-CM | POA: Diagnosis present

## 2024-09-15 DIAGNOSIS — Z79891 Long term (current) use of opiate analgesic: Secondary | ICD-10-CM | POA: Diagnosis not present

## 2024-09-21 ENCOUNTER — Telehealth: Payer: Self-pay

## 2024-09-21 NOTE — Telephone Encounter (Signed)
 Spoke to [employer] indicating I came across a media document requesting Alison [name] to check labs monthly, may be needed weekly.  Informed patient would need to have their labs drawn at their primary care and results faxed over to them. Informed Alison [name] is listed as PCP; provided phone and fax number.

## 2024-09-23 DIAGNOSIS — F411 Generalized anxiety disorder: Secondary | ICD-10-CM | POA: Diagnosis not present

## 2024-09-23 DIAGNOSIS — F331 Major depressive disorder, recurrent, moderate: Secondary | ICD-10-CM | POA: Diagnosis not present

## 2024-09-28 ENCOUNTER — Other Ambulatory Visit: Payer: Self-pay | Admitting: Nurse Practitioner

## 2024-09-28 DIAGNOSIS — E1165 Type 2 diabetes mellitus with hyperglycemia: Secondary | ICD-10-CM

## 2024-09-28 DIAGNOSIS — I10 Essential (primary) hypertension: Secondary | ICD-10-CM

## 2024-09-29 NOTE — Telephone Encounter (Signed)
 Rx 08/02/24 #90- too soon Requested Prescriptions  Pending Prescriptions Disp Refills   lisinopril  (ZESTRIL ) 20 MG tablet [Pharmacy Med Name: LISINOPRIL  20 MG TABLET 20 Tablet] 90 tablet 10    Sig: TAKE 1 TABLET BY MOUTH DAILY NOT PAUSED     Cardiovascular:  ACE Inhibitors Failed - 09/29/2024  4:19 PM      Failed - Last BP in normal range    BP Readings from Last 1 Encounters:  08/10/24 (!) 138/95         Passed - Cr in normal range and within 180 days    Creat  Date Value Ref Range Status  04/01/2024 1.14 (H) 0.50 - 1.03 mg/dL Final   Creatinine, Ser  Date Value Ref Range Status  07/27/2024 0.89 0.44 - 1.00 mg/dL Final   Creatinine, Urine  Date Value Ref Range Status  11/13/2023 75 20 - 275 mg/dL Final         Passed - K in normal range and within 180 days    Potassium  Date Value Ref Range Status  07/27/2024 3.8 3.5 - 5.1 mmol/L Final         Passed - Patient is not pregnant      Passed - Valid encounter within last 6 months    Recent Outpatient Visits           2 months ago Dysuria   [facility] Gareth Mliss FALCON, FNP   2 months ago Pain of left calf   [facility] Franchot Isaiah LABOR, MD   3 months ago Burning with urination   [facility] Gareth Mliss FALCON, FNP   4 months 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   [facility] Gareth Mliss FALCON, FNP   6 months ago Essential hypertension   [facility] Gareth Mliss FALCON, FNP       Future Appointments             In 1 month Gareth, Mliss FALCON, FNP [facility], [place]

## 2024-10-01 ENCOUNTER — Ambulatory Visit: Attending: Nurse Practitioner

## 2024-10-01 ENCOUNTER — Ambulatory Visit: Payer: Self-pay | Admitting: Nurse Practitioner

## 2024-10-01 DIAGNOSIS — R0781 Pleurodynia: Secondary | ICD-10-CM | POA: Diagnosis not present

## 2024-10-01 LAB — ECHOCARDIOGRAM COMPLETE
AR max vel: 2.6 cm2
AV Area VTI: 2.68 cm2
AV Area mean vel: 2.59 cm2
AV Mean grad: 4 mmHg
AV Peak grad: 9 mmHg
Ao pk vel: 1.5 m/s
Area-P 1/2: 3.6 cm2
Calc EF: 56.4 %
S' Lateral: 3.4 cm
Single Plane A2C EF: 57.9 %
Single Plane A4C EF: 52.6 %

## 2024-10-04 ENCOUNTER — Ambulatory Visit: Admitting: Psychiatry

## 2024-10-04 ENCOUNTER — Other Ambulatory Visit: Payer: Self-pay

## 2024-10-04 ENCOUNTER — Encounter: Payer: Self-pay | Admitting: Psychiatry

## 2024-10-04 VITALS — BP 134/88 | HR 80 | Temp 97.8°F | Ht 66.0 in | Wt 243.8 lb

## 2024-10-04 DIAGNOSIS — G4701 Insomnia due to medical condition: Secondary | ICD-10-CM | POA: Diagnosis not present

## 2024-10-04 DIAGNOSIS — F3176 Bipolar disorder, in full remission, most recent episode depressed: Secondary | ICD-10-CM

## 2024-10-04 DIAGNOSIS — F4310 Post-traumatic stress disorder, unspecified: Secondary | ICD-10-CM | POA: Diagnosis not present

## 2024-10-04 DIAGNOSIS — F411 Generalized anxiety disorder: Secondary | ICD-10-CM | POA: Diagnosis not present

## 2024-10-04 NOTE — Progress Notes (Unsigned)
 BH MD OP Progress Note  10/04/2024 1:30 PM LY WASS  MRN:  [id number]  Chief Complaint:  Chief Complaint  Patient presents with   Medication Refill   Follow-up   Bipolar disorders   Discussed the use of AI scribe software for clinical note transcription with the patient, who gave verbal consent to proceed.  History of Present Illness Alison [name] is a 48 year old Caucasian female, lives in [place], single, on disability, has a history of bipolar disorder, PTSD, medical problems including GI issues, history of liver transplant, B-cell lymphoma, SLE, diabetes mellitus, stage IV chronic kidney disease, OSA currently not compliant with CPAP, chronic migraine headaches, hyperlipidemia, hypertension was evaluated in office today for a follow-up appointment.  She reports her mood remains good and stable. She describes sleeping well and finds that mirtazapine  15 mg at night helps her sleep.  She states her appetite remains good.  Ongoing social anxiety continues to affect her, and she shares that her golden retriever, Iris, helps her manage this. She uses a leash wrap for Iris to indicate when she does not want people to approach, which she finds helpful for her social anxiety.  She reports her current medications include lurasidone  20 mg, venlafaxine  112.5 mg, and mirtazapine  15 mg. She confirms not needing benztropine  recently. She notes experiencing some facial muscle movements, specifically chewing motions, and she is able to stop these by reminding herself.  She celebrated Christmas by going to the movies and having pizza dinner together. She orders a custom Christmas ornament each year featuring household members and pets.    Visit Diagnosis:    ICD-10-CM   1. Bipolar 1 disorder, depressed, full remission  F31.76     2. PTSD (post-traumatic stress disorder)  F43.10     3. GAD (generalized anxiety disorder)  F41.1     4. Insomnia due to medical condition  G47.01     Pain, obstructive sleep apnea noncompliant on CPAP      Past Psychiatric History: I have reviewed past psychiatric history from progress note on 06/30/2019.  Past Medical History:  Past Medical History:  Diagnosis Date   Abnormal uterine bleeding    Allergy    Anxiety    Arthritis    Bipolar disorder (manic depression) (HCC)    Cancer (HCC) Diffuse B-Cell Lymphoma   2015   Chest pain    a. 09/2021 MV: Low risk without ischemia or infarct.  Normal EF.   Chronic kidney failure    Chronic renal disease, stage III (HCC)    Diabetes mellitus without complication (HCC)    DLBCL (diffuse large B cell lymphoma) (HCC) 2015   Right axillary lymph node resected and chemo tx's.   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 02/2023 Echo: EF 55-60%, no rwma, nl RV size/fxn. Mildly dil LA.   Personality disorder (HCC)    Pineal gland cyst    Post herpetic neuralgia  PTSD (post-traumatic stress disorder)    RBBB    Renal disorder    S/P liver transplant (HCC) 1993   Sleep apnea    has not recieved cpap yet-other one was recalled    Past Surgical History:  Procedure Laterality Date   ARTERY BIOPSY Right 04/14/2024   Procedure: BIOPSY TEMPORAL ARTERY;  Surgeon: Marea Selinda RAMAN, MD;  Location: [facility] ORS;  Service: General;  Laterality: Right;   BONE MARROW BIOPSY  01/13/2015   BREAST BIOPSY Right 12/2014   US  Axilla Bx, Positive for Lymphoma   BREAST BIOPSY Right 2011   benign   BREAST BIOPSY Right 2023   Stereo bx, X-clip, Fat Necrosis   BREAST BIOPSY Right 01/20/2024   US  RT BREAST BX  W LOC DEV 1ST LESION IMG BX SPEC US  GUIDE 01/20/2024 [facility]   BREAST SURGERY Right 2016   Lymphoma, lymph node removal. pt then got a seroma and had to do more surgery   CHOLECYSTECTOMY     COLONOSCOPY     COLONOSCOPY WITH PROPOFOL  N/A 02/28/2022   Procedure: COLONOSCOPY WITH PROPOFOL ;  Surgeon: Jinny Carmine, MD;  Location: [facility] ENDOSCOPY;  Service: Endoscopy;  Laterality: N/A;   ESOPHAGOGASTRODUODENOSCOPY     ESOPHAGOGASTRODUODENOSCOPY (EGD) WITH PROPOFOL  N/A 01/09/2021   Procedure: ESOPHAGOGASTRODUODENOSCOPY (EGD) WITH PROPOFOL ;  Surgeon: Jinny Carmine, MD;  Location: [facility] ENDOSCOPY;  Service: Endoscopy;  Laterality: N/A;   HERNIA REPAIR     x4-all from liver transplant   HYSTEROSCOPY WITH D & C N/A 11/13/2021   Procedure: DILATATION AND CURETTAGE /HYSTEROSCOPY;  Surgeon: Victor Claudell SAUNDERS, MD;  Location: [facility] ORS;  Service: Gynecology;  Laterality: N/A;   infusaport     LIVER TRANSPLANT  12/17/1991   LUMBAR PUNCTURE     PORT A CATH INJECTION ([facility] HX)     RENAL BIOPSY     tumor removal  2015   cancer right side    Family Psychiatric History: Reviewed family psychiatric history from progress note on 06/30/2019.  Family History:  Family History  Problem Relation Age of Onset   Heart disease Mother    Hypertension Mother    Cancer - Other Mother    Bipolar disorder Mother    Anxiety disorder Mother    Arthritis Mother    Depression Mother    Heart disease Father    Hypertension Father    Diabetes Father    Parkinson's disease Maternal Grandmother    COPD Maternal Grandmother    Cancer Maternal Aunt    Cancer Maternal Uncle    Cancer Maternal Grandfather    Lupus Paternal Grandmother    Hypertension Brother    Alcohol abuse Brother    Depression Brother    Alcohol abuse Maternal Uncle     Social History: Reviewed social history from progress note on 06/30/2019. Social History   Socioeconomic History   Marital status: Single    Spouse name: Not on file    Number of children: 0   Years of education: 9   Highest education level: GED or equivalent  Occupational History   Occupation: disabled  Tobacco Use   Smoking status: Never    Passive exposure: Past   Smokeless tobacco: Never   Tobacco comments:    Occasional CBD oil  Vaping Use   Vaping status: Never Used  Substance and Sexual Activity   Alcohol use: Not Currently   Drug use: Not Currently    Comment: pain managment last used in early april   Sexual activity: Not  Currently  Other Topics Concern   Not on file  Social History Narrative   Not on file   Social Drivers of Health   Tobacco Use: Low Risk (10/04/2024)   Patient History    Smoking Tobacco Use: Never    Smokeless Tobacco Use: Never    Passive Exposure: Past  Financial Resource Strain: Low Risk (04/01/2024)   Overall Financial Resource Strain (CARDIA)    Difficulty of Paying Living Expenses: Not very hard  Food Insecurity: No Food Insecurity (07/26/2024)   Epic    Worried About Programme Researcher, Broadcasting/film/video in the Last Year: Never true    Ran Out of Food in the Last Year: Never true  Transportation Needs: No Transportation Needs (07/26/2024)   Epic    Lack of Transportation (Medical): No    Lack of Transportation (Non-Medical): No  Physical Activity: Inactive (04/01/2024)   Exercise Vital Sign    Days of Exercise per Week: 0 days    Minutes of Exercise per Session: 0 min  Stress: Stress Concern Present (04/01/2024)   [employer] of Occupational Health - Occupational Stress Questionnaire    Feeling of Stress: To some extent  Social Connections: Unknown (04/12/2024)   Social Connection and Isolation Panel    Frequency of Communication with Friends and Family: Patient declined    Frequency of Social Gatherings with Friends and Family: Patient declined    Attends Religious Services: Patient declined    Active Member of Clubs or Organizations: Patient declined    Attends Banker Meetings: Patient  declined    Marital Status: Living with partner  Depression (PHQ2-9): Medium Risk (10/04/2024)   Depression (PHQ2-9)    PHQ-2 Score: 5  Alcohol Screen: Low Risk (10/07/2023)   Alcohol Screen    Last Alcohol Screening Score (AUDIT): 0  Housing: Low Risk (07/26/2024)   Epic    Unable to Pay for Housing in the Last Year: No    Number of Times Moved in the Last Year: 0    Homeless in the Last Year: No  Utilities: Not At Risk (07/26/2024)   Epic    Threatened with loss of utilities: No  Health Literacy: Adequate Health Literacy (04/01/2024)   B1300 Health Literacy    Frequency of need for help with medical instructions: Never    Allergies: Allergies[1]  Metabolic Disorder Labs: Lab Results  Component Value Date   HGBA1C 8.7 (H) 06/04/2024   MPG 203 06/04/2024   MPG 232 04/01/2024   Lab Results  Component Value Date   PROLACTIN 16.4 12/12/2022   PROLACTIN 13.4 07/15/2019   Lab Results  Component Value Date   CHOL 123 06/04/2024   TRIG 150 (H) 06/04/2024   HDL 44 06/04/2024   CHOLHDL 2.8 06/04/2024   VLDL 30 06/04/2024   LDLCALC 49 06/04/2024   LDLCALC 57 04/01/2024   Lab Results  Component Value Date   TSH 1.56 04/01/2024   TSH 4.878 (H) 03/09/2023    Therapeutic Level Labs: No results found for: LITHIUM No results found for: VALPROATE No results found for: CBMZ  Current Medications: Current Outpatient Medications  Medication Sig Dispense Refill   FARXIGA 10 MG TABS tablet Take 10 mg by mouth every morning.     Accu-Chek Softclix Lancets lancets 2 (two) times daily.     albuterol  (VENTOLIN  HFA) 108 (90 Base) MCG/ACT inhaler Inhale 2 puffs into the lungs every 6 (six) hours as needed for wheezing or shortness of breath. 8 g 0  aspirin  EC 81 MG tablet Take 1 tablet (81 mg total) by mouth daily. Swallow whole.     baclofen  (LIORESAL ) 10 MG tablet Take 1 tablet by mouth 2 (two) times daily.     benztropine  (COGENTIN ) 1 MG tablet TAKE 1 TABLET BY MOUTH ONCE  DAILY AS NEEDED FOR TREMORS 30 tablet 10   Continuous Glucose Sensor (DEXCOM G7 SENSOR) MISC 1 Device by Does not apply route continuous. 9 each 3   Continuous Glucose Sensor (FREESTYLE LIBRE 3 PLUS SENSOR) MISC by Does not apply route. Change sensor every 15 days.     fluticasone  (FLONASE ) 50 MCG/ACT nasal spray Place into the nose.     glipiZIDE  (GLUCOTROL  XL) 10 MG 24 hr tablet TAKE 1 TABLET BY MOUTH IN THE MORNING AND AT BEDTIME 60 tablet 11   glucose blood (ACCU-CHEK GUIDE TEST) test strip 1 each by Other route 2 (two) times daily. 100 each 12   glucose blood (RELION GLUCOSE TEST STRIPS) test strip USE 1  TO CHECK GLUCOSE THREE TIMES DAILY 300 each 12   insulin  glargine (LANTUS  SOLOSTAR) 100 UNIT/ML Solostar Pen Inject 40 Units into the skin 2 (two) times daily. (Patient taking differently: Inject 40 Units into the skin daily. Taking 40 units at night before bed) 15 mL 0   lisinopril  (ZESTRIL ) 20 MG tablet Take 1 tablet (20 mg total) by mouth daily. Not paused (Patient not taking: Reported on 08/10/2024) 90 tablet 0   lurasidone  (LATUDA ) 20 MG TABS tablet Take 1 tablet (20 mg total) by mouth daily with supper. 90 tablet 1   magnesium  oxide (MAG-OX) 400 MG tablet Take 1,000 mg by mouth 2 (two) times daily.     metFORMIN  (GLUCOPHAGE ) 1000 MG tablet TAKE ONE (1) TABLET BY MOUTH TWICE DAILY WITH MEALS 180 tablet 11   mirabegron  ER (MYRBETRIQ ) 50 MG TB24 tablet Take 1 tablet (50 mg total) by mouth daily. 90 tablet 3   mirtazapine  (REMERON ) 15 MG tablet TAKE 1 TABLET BY MOUTH AT BEDTIME 90 tablet 11   naloxone  (NARCAN ) nasal spray 4 mg/0.1 mL Place 0.4 mg into the nose once.     NOVOLOG  FLEXPEN 100 UNIT/ML FlexPen Inject 40 Units into the skin 3 (three) times daily with meals. Hold if not eating at least half of a full meal. (Patient taking differently: Inject 9 Units into the skin 3 (three) times daily with meals. Add additional unit depending on what glucose is) 15 mL 11   ondansetron  (ZOFRAN ) 4 MG  tablet Take 1 tablet (4 mg total) by mouth every 6 (six) hours as needed for nausea. 20 tablet 0   oxyCODONE  (OXY IR/ROXICODONE ) 5 MG immediate release tablet Take 5 mg by mouth 3 (three) times daily as needed.     OZEMPIC , 2 MG/DOSE, 8 MG/3ML SOPN INJECT 2MG  SUBCUTANEOUSLY AS DIRECTED ONCE WEEKLY 9 mL 11   pantoprazole  (PROTONIX ) 40 MG tablet Take 1 tablet (40 mg total) by mouth daily. Recommend using while on Prednisone . 30 tablet 0   PRECISION QID TEST test strip      pregabalin  (LYRICA ) 200 MG capsule Take 1 capsule (200 mg total) by mouth 2 (two) times daily. 60 capsule 0   rosuvastatin  (CRESTOR ) 10 MG tablet TAKE 1 TABLET BY MOUTH ONCE DAILY 90 tablet 3   SURE COMFORT PEN NEEDLES 32G X 6 MM MISC USE AS DIRECTED 100 each 11   tacrolimus  (PROGRAF ) 1 MG capsule Take 1 capsule by mouth every morning.     UBRELVY  50  MG TABS Take 1 tablet by mouth daily as needed.     venlafaxine  XR (EFFEXOR -XR) 37.5 MG 24 hr capsule Take 1 capsule (37.5 mg total) by mouth every morning. Take along with 75 mg daily 30 capsule 5   venlafaxine  XR (EFFEXOR -XR) 75 MG 24 hr capsule Take 1 capsule (75 mg total) by mouth daily with breakfast. Take along with 37.5 mg daily 30 capsule 10   No current facility-administered medications for this visit.     Musculoskeletal: Strength & Muscle Tone: within normal limits Gait & Station: normal Patient leans: N/A  Psychiatric Specialty Exam: Review of Systems  Psychiatric/Behavioral: Negative.      Blood pressure 134/88, pulse 80, temperature 97.8 F (36.6 C), temperature source Temporal, height 5' 6 (1.676 m), weight 243 lb 12.8 oz (110.6 kg), last menstrual period 10/16/2021.Body mass index is 39.35 kg/m.  General Appearance: Fairly Groomed  Eye Contact:  Fair  Speech:  Clear and Coherent  Volume:  Normal  Mood:  Euthymic  Affect:  Congruent  Thought Process:  Goal Directed and Descriptions of Associations: Intact  Orientation:  Full (Time, Place, and Person)   Thought Content: Logical   Suicidal Thoughts:  No  Homicidal Thoughts:  No  Memory:  Immediate;   Fair Recent;   Fair Remote;   Fair  Judgement:  Fair  Insight:  Fair  Psychomotor Activity:  Normal  Concentration:  Concentration: Fair and Attention Span: Fair  Recall:  [employer] of Knowledge: Fair  Language: Fair  Akathisia:  No  Handed:  Right  AIMS (if indicated): done  Assets:  Communication Skills Desire for Improvement Housing Social Support Transportation  ADL's:  Intact  Cognition: WNL  Sleep:  Fair   Screenings: Midwife Visit from 04/28/2024 in [facility] Office Visit from 11/04/2023 in [facility] Office Visit from 07/21/2023 in [facility] Office Visit from 12/12/2022 in [facility] Office Visit from 09/12/2022 in [facility]  AIMS Total Score 0 0 0 0 0   GAD-7    Flowsheet Row Office Visit from 10/04/2024 in [facility] Office Visit from 07/08/2024 in [facility] Office Visit from 04/28/2024 in [facility] Office Visit from 04/01/2024 in [facility] Office Visit from 01/05/2024 in [facility]  Total GAD-7 Score 5 5 3 14  0   PHQ2-9    Flowsheet Row Office Visit from 10/04/2024 in [facility] Patient Outreach Telephone from 09/03/2024 in [place] [facility] Office Visit from 08/10/2024 in [facility] [place] Patient Outreach Telephone from 07/26/2024 in [place] [facility] DEPARTMENT Office Visit from 07/08/2024 in [facility]  PHQ-2 Total Score 2  2 0 2 2  PHQ-9 Total Score 5 5 0 7 8   Flowsheet Row Office Visit from 10/04/2024 in [facility] Office Visit from 08/10/2024 in [facility] Of [place] Video Visit from 07/27/2024 in [facility]  C-SSRS RISK CATEGORY No Risk No Risk No Risk     Assessment and Plan: Alison [name] is a 48 year old Caucasian female who  presented for a follow-up appointment, discussed assessment and plan as noted below.  1. Bipolar 1 disorder, depressed, full remission Currently reports overall mood symptoms are stable Continue Latuda  20 mg daily Continue Benztropine  1 mg as needed for side effects of Latuda   2. PTSD (post-traumatic stress disorder)-stable Denies any current concerns Continue Venlafaxine  112.5 mg daily Continue Mirtazapine  15 mg at bedtime Continue psychotherapy sessions with Ms. Allison  3. GAD (generalized anxiety disorder)-stable Currently reports anxiety symptoms is well-managed. Continue venlafaxine  as prescribed Continue psychotherapy sessions  4. Insomnia due to medical condition-stable Currently denies any sleep problems.  Does have a history of sleep apnea, did not tolerate CPAP Continue Mirtazapine  15 mg at bedtime  Follow-up Follow-up in clinic in 3 to 4 months or sooner if needed.   Consent: Patient/Guardian gives verbal consent for treatment and assignment of benefits for services provided during this visit. Patient/Guardian expressed understanding and agreed to proceed.   This note was generated in part or whole with voice recognition software. Voice recognition is usually quite accurate but there are transcription errors that can and very often do occur. I apologize for any typographical errors that were not detected and corrected.    Lennart Gladish, MD 10/04/2024, 1:30 PM     [1]  Allergies Allergen Reactions   Penicillin G  Itching, Rash, Anaphylaxis and Palpitations   Lamictal  [Lamotrigine ] Rash   Tape    Carbamazepine Other (See Comments)    Medication interaction-prograf     Hydrocodone-Acetaminophen  Itching   Naproxen Itching   Penicillins    Vancomycin  Rash    Macular/blotchy rash at infusion site

## 2024-10-06 ENCOUNTER — Other Ambulatory Visit: Payer: Self-pay | Admitting: Nurse Practitioner

## 2024-10-06 ENCOUNTER — Other Ambulatory Visit: Payer: Self-pay | Admitting: "Endocrinology

## 2024-10-06 DIAGNOSIS — I10 Essential (primary) hypertension: Secondary | ICD-10-CM

## 2024-10-06 DIAGNOSIS — E1165 Type 2 diabetes mellitus with hyperglycemia: Secondary | ICD-10-CM

## 2024-10-07 NOTE — Telephone Encounter (Signed)
 Requested Prescriptions  Pending Prescriptions Disp Refills   lisinopril  (ZESTRIL ) 20 MG tablet [Pharmacy Med Name: LISINOPRIL  20 MG TABLET 20 Tablet] 90 tablet 10    Sig: TAKE 1 TABLET BY MOUTH DAILY NOT PAUSED     Cardiovascular:  ACE Inhibitors Passed - 10/07/2024 12:43 PM      Passed - Cr in normal range and within 180 days    Creat  Date Value Ref Range Status  04/01/2024 1.14 (H) 0.50 - 1.03 mg/dL Final   Creatinine, Ser  Date Value Ref Range Status  07/27/2024 0.89 0.44 - 1.00 mg/dL Final   Creatinine, Urine  Date Value Ref Range Status  11/13/2023 75 20 - 275 mg/dL Final         Passed - K in normal range and within 180 days    Potassium  Date Value Ref Range Status  07/27/2024 3.8 3.5 - 5.1 mmol/L Final         Passed - Patient is not pregnant      Passed - Last BP in normal range    BP Readings from Last 1 Encounters:  08/10/24 (!) 138/95         Passed - Valid encounter within last 6 months    Recent Outpatient Visits           2 months ago Dysuria   [facility] Gareth Mliss FALCON, FNP   3 months ago Pain of left calf   [facility] Franchot Isaiah LABOR, MD   3 months ago Burning with urination   [facility] Gareth Mliss FALCON, FNP   4 months 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   [facility] Gareth Mliss FALCON, FNP   6 months ago Essential hypertension   [facility] Gareth Mliss FALCON, FNP       Future Appointments             In 1 month Gareth, Mliss FALCON, FNP [facility] [place]

## 2024-10-08 ENCOUNTER — Other Ambulatory Visit: Payer: Self-pay

## 2024-10-08 NOTE — Patient Outreach (Signed)
 Complex Care Management   Visit Note  10/08/2024  Name:  Alison [name] MRN: [id number] DOB: 10-07-1976  Situation: Referral received for Complex Care Management related to Diabetes with Complications I obtained verbal consent from Patient.  Visit completed with Patient  on the phone  Background:   Past Medical History:  Diagnosis Date   Abnormal uterine bleeding    Allergy    Anxiety    Arthritis    Bipolar disorder (manic depression) (HCC)    Cancer (HCC) Diffuse B-Cell Lymphoma   2015   Chest pain    a. 09/2021 MV: Low risk without ischemia or infarct.  Normal EF.   Chronic kidney failure    Chronic renal disease, stage III (HCC)    Diabetes mellitus without complication (HCC)    DLBCL (diffuse large B cell lymphoma) (HCC) 2015   Right axillary lymph node resected and chemo tx's.   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 02/2023 Echo: EF 55-60%, no rwma, nl RV size/fxn. Mildly dil LA.   Personality disorder (HCC)    Pineal gland cyst    Post herpetic neuralgia    PTSD (post-traumatic stress disorder)    RBBB    Renal disorder    S/P liver transplant (HCC) 1993   Sleep apnea    has not recieved cpap yet-other one was recalled    Assessment: Patient Reported Symptoms:  Cognitive Cognitive Status: No symptoms reported Cognitive/Intellectual Conditions Management [RPT]: None reported or documented in medical history or problem list   Health Maintenance Behaviors: Annual physical exam  Neurological Neurological Review of Symptoms: Numbness Neurological  Management Strategies: Medication therapy Neurological Comment: hands and feet numbness/tingling unchanged,  HEENT HEENT Symptoms Reported: Eye pain HEENT Management Strategies: Routine screening HEENT Comment: occasional eye pain - fleeting no vision changes with pain    Cardiovascular Does patient have uncontrolled Hypertension?: No Cardiovascular Management Strategies: Medication therapy, Routine screening Cardiovascular Comment: no pain, no swelling in legs or feet  Respiratory Other Respiratory Symptoms: no longer ill, no SOB/cough Respiratory Management Strategies: Routine screening  Endocrine Is patient diabetic?: Yes List most recent blood sugar readings, include date and time of day: Freestyle Libre 3 - FBG 125 today, one low 69 because had not eaten, treated appropriately no other issues, states Herlene has really helped me manage my BG recognizing how different foods affecting BG and making changes    Gastrointestinal Gastrointestinal Symptoms Reported: No symptoms reported      Genitourinary Genitourinary Symptoms Reported: No symptoms reported    Integumentary Integumentary Symptoms Reported: No symptoms reported Skin Management Strategies: Routine screening  Musculoskeletal Musculoskelatal Symptoms Reviewed: Difficulty walking, Joint pain Other Musculoskeletal Symptoms: electric wheelchair, cane prn no falls, reports 1-2/10 controlled with med Musculoskeletal Management Strategies: Medication therapy Falls in the past year?: Yes Number of falls in past year: 2 or more Was there an injury with Fall?: Yes Fall Risk Category Calculator: 3  Patient Fall Risk Level: High Fall Risk Patient at Risk for Falls Due to: History of fall(s) Fall risk Follow up: Falls evaluation completed  Psychosocial Psychosocial Symptoms Reported: Depression - if selected complete PHQ 2-9 Additional Psychological Details: states overall doing good - report had memory pop up on FaceBook and had  depressive cry (mother dies around holiday) but able to process and manage and dpong better today, continues to do counseling and feels helpful Behavioral Management Strategies: Medication therapy, Counseling Major Change/Loss/Stressor/Fears (CP): Medical condition, self Techniques to Cope with Loss/Stress/Change: Diversional activities, Counseling      10/08/2024    PHQ2-9 Depression Screening   Little interest or pleasure in doing things Several days  Feeling down, depressed, or hopeless Several days  PHQ-2 - Total Score 2  Trouble falling or staying asleep, or sleeping too much Several days  Feeling tired or having little energy Several days  Poor appetite or overeating  Not at all  Feeling bad about yourself - or that you are a failure or have let yourself or your family down Not at all  Trouble concentrating on things, such as reading the newspaper or watching television Several days  Moving or speaking so slowly that other people could have noticed.  Or the opposite - being so fidgety or restless that you have been moving around a lot more than usual Not at all  Thoughts that you would be better off dead, or hurting yourself in some way Not at all  PHQ2-9 Total Score 5  If you checked off any problems, how difficult have these problems made it for you to do your work, take care of things at home, or get along with other people Somewhat difficult  Depression Interventions/Treatment Counseling, Currently on Treatment, Medication    There were no vitals filed for this visit.    Medications Reviewed Today     Reviewed by Devra Lands, RN (Registered Nurse) on 10/08/24 at 1007  Med List Status: <None>   Medication Order Taking? Sig Documenting Provider Last Dose Status Informant  Accu-Chek Softclix Lancets lancets [id number] Yes 2 (two) times daily. [provider]  Active   albuterol  (VENTOLIN  HFA) 108 (90 Base) MCG/ACT inhaler [id number] Yes Inhale 2 puffs into the lungs  every 6 (six) hours as needed for wheezing or shortness of breath. Gareth Mliss FALCON, FNP  Active Self           Med Note [facility], ELIZABETH A   Mon Mar 15, 2024 10:57 AM) PRN  aspirin  EC 81 MG tablet [id number]  Take 1 tablet (81 mg total) by mouth daily. Swallow whole.  Patient not taking: Reported on 10/08/2024   Darliss Rogue, MD  Active   baclofen  (LIORESAL ) 10 MG tablet [id number] Yes Take 1 tablet by mouth 2 (two) times daily. [provider]  Active Self  benztropine  (COGENTIN ) 1 MG tablet [id number]  TAKE 1 TABLET BY MOUTH ONCE DAILY AS NEEDED FOR TREMORS [name], [name], MD  Active Self  Continuous Glucose Sensor (DEXCOM G7 SENSOR) MISC [id number]  1 Device by Does not apply route continuous.  Patient not taking: Reported on 10/08/2024   Motwani, Komal, MD  Consider Medication Status and Discontinue   Continuous Glucose Sensor (FREESTYLE LIBRE 3 PLUS SENSOR) MISC [id number] Yes by Does not apply route. Change sensor every 15 days. [provider]  Active   FARXIGA 10 MG TABS tablet [id number]  Take 10 mg by mouth every morning.  Patient not taking: Reported on 10/08/2024  [provider]  Active   fluticasone  (FLONASE ) 50 MCG/ACT nasal spray [id number] Yes Place into the nose. [provider]  Active Self           Med Note SIGRID, TIFFANY N   Sun Apr 11, 2024 11:59 PM) PRN  glipiZIDE  (GLUCOTROL  XL) 10 MG 24 hr tablet [id number] Yes TAKE 1 TABLET BY MOUTH IN THE MORNING AND AT BEDTIME Motwani, Komal, MD  Active   glucose blood (ACCU-CHEK GUIDE TEST) test strip [id number] Yes 1 each by Other route 2 (two) times daily. Motwani, Komal, MD  Active   glucose blood (RELION GLUCOSE TEST STRIPS) test strip [id number] Yes USE 1  TO CHECK GLUCOSE THREE TIMES DAILY Motwani, Komal, MD  Active   insulin  glargine (LANTUS  SOLOSTAR) 100 UNIT/ML Solostar Pen [id number] Yes Inject 40 Units into the skin 2 (two) times daily.  Patient taking differently: Inject 40 Units into the  skin daily. Taking 40 units at night before bed   Fausto Burnard LABOR, DO  Active   lisinopril  (ZESTRIL ) 20 MG tablet [id number] Yes TAKE 1 TABLET BY MOUTH DAILY NOT PAUSED Pender, Julie F, FNP  Active   lurasidone  (LATUDA ) 20 MG TABS tablet [id number] Yes Take 1 tablet (20 mg total) by mouth daily with supper. [name], [name], MD  Active   magnesium  oxide (MAG-OX) 400 MG tablet [id number] Yes Take 1,000 mg by mouth 2 (two) times daily. [provider]  Active Self  metFORMIN  (GLUCOPHAGE ) 1000 MG tablet [id number] Yes TAKE ONE (1) TABLET BY MOUTH TWICE DAILY WITH MEALS Motwani, Komal, MD  Active   mirabegron  ER (MYRBETRIQ ) 50 MG TB24 tablet [id number] Yes Take 1 tablet (50 mg total) by mouth daily. Gareth Mliss FALCON, FNP  Active Self  mirtazapine  (REMERON ) 15 MG tablet [id number] Yes TAKE 1 TABLET BY MOUTH AT BEDTIME [name], [name], MD  Active   naloxone  (NARCAN ) nasal spray 4 mg/0.1 mL [id number] Yes Place 0.4 mg into the nose once. [provider]  Active Self           Med Note GROVER, BURNARD S   Wed Oct 15, 2023  1:51 PM) prn  NOVOLOG  FLEXPEN 100 UNIT/ML FlexPen [id number] Yes Inject 40 Units into the skin 3 (three) times daily with meals. Hold if not eating at least half of a full meal.  Patient taking differently: Inject 9 Units into the skin 3 (three) times daily with meals. Add additional unit depending on what glucose is   Fausto Burnard A, DO  Active   ondansetron  (ZOFRAN ) 4 MG tablet [id number] Yes Take 1 tablet (4 mg total) by mouth every 6 (six) hours as needed for nausea. Caleen Qualia, MD  Active Self  oxyCODONE  (OXY IR/ROXICODONE ) 5 MG immediate release tablet [id number] Yes Take 5 mg by mouth 3 (three) times daily as needed. [provider]  Active Self  OZEMPIC , 2 MG/DOSE, 8 MG/3ML SOPN [id number] Yes INJECT 2MG  SUBCUTANEOUSLY AS DIRECTED ONCE WEEKLY Motwani, Komal, MD  Active   pantoprazole  (PROTONIX ) 40 MG tablet [id number] Yes Take 1 tablet (40 mg total) by mouth  daily. Recommend using while on Prednisone . Fausto Burnard A, DO  Active   PRECISION QID TEST test strip [id number] Yes  [provider]  Active   pregabalin  (LYRICA ) 200 MG capsule [id number] Yes Take 1 capsule (200 mg total) by mouth 2 (two) times daily. Floy Roberts, MD  Active Self  rosuvastatin  (CRESTOR ) 10 MG tablet [id number] Yes TAKE 1 TABLET BY MOUTH ONCE DAILY Darryle,  Thom CROME, PA-C  Active   SURE COMFORT PEN NEEDLES 32G X 6 MM MISC [id number] Yes USE AS DIRECTED Pender, Julie F, FNP  Active   tacrolimus  (PROGRAF ) 1 MG capsule [id number] Yes Take 1 capsule by mouth every morning. [provider]  Active Self  UBRELVY  50 MG TABS [id number] Yes Take 1 tablet by mouth daily as needed. [provider]  Active Self  venlafaxine  XR (EFFEXOR -XR) 37.5 MG 24 hr capsule [id number] Yes Take 1 capsule (37.5 mg total) by mouth every morning. Take along with 75 mg daily [name], [name], MD  Active   venlafaxine  XR (EFFEXOR -XR) 75 MG 24 hr capsule [id number] Yes Take 1 capsule (75 mg total) by mouth daily with breakfast. Take along with 37.5 mg daily [name], [name], MD  Active             Recommendation:   PCP Follow-up Continue Current Plan of Care See Dental Provider  Follow Up Plan:   Telephone follow-up in 1 month  [name], MSN, RN [facility]  [facility], [facility] RN Care Manager Direct Dial: [phone] Fax: [phone]

## 2024-10-08 NOTE — Patient Instructions (Signed)
 Visit Information  Alison [name] was given information about Medicaid Managed Care team care coordination services as a part of their Healthy [facility] Medicaid benefit. Alison [name]   If you would like to schedule transportation through your [facility] plan, please call the following number at least 2 days in advance of your appointment: [phone]  For information about your ride after you set it up, call Ride Assist at [phone]. Use this number to activate a Will Call pickup, or if your transportation is late for a scheduled pickup. Use this number, too, if you need to make a change or cancel a previously scheduled reservation.  If you need transportation services right away, call [phone]. The after-hours call center is staffed 24 hours to handle ride assistance and urgent reservation requests (including discharges) 365 days a year. Urgent trips include sick visits, hospital discharge requests and life-sustaining treatment.  Call the [facility] Line at [phone], at any time, 24 hours a day, 7 days a week. If you are in danger or need immediate medical attention call 911.   Please see education materials related to NONE provided by MyChart link.  Patient verbalizes understanding of instructions and care plan provided today and agrees to view in MyChart. Active MyChart status and patient understanding of how to access instructions and care plan via MyChart confirmed with patient.     Telephone follow up appointment with Managed Medicaid care management team member scheduled for:11/05/2024 at 10:00 am  Alison Duos, MSN, RN [facility]  [facility], [facility] RN Care Manager Direct Dial: [phone] Fax: [phone]  Following is a copy of your plan of care:   Goals Addressed             This Visit's Progress    VBCI RN Care Plan - DM Insulin  Dependent   On track    Problems:  Chronic Disease Management support and  education needs related to DMII  Goal: Over the next 90 days the Patient will attend all scheduled medical appointments: PPC and specialist as evidenced by chart review, no missed appointments        continue to work with RN Care Manager and/or Social Worker to address care management and care coordination needs related to DMII as evidenced by adherence to care management team scheduled appointments     take all medications exactly as prescribed and will call provider for medication related questions as evidenced by verbalizing compliance with all medications, specifically use of basal and short acting insulin     verbalize basic understanding of DMII disease process and self health management plan as evidenced by taking insulin  and medications as prescribed, checking BG at least fasting and before meals, then as advised by Endo at appointment 04/05/24, calling provider with questions or concerns, attending Endocrine appointment and any recommended classes related to DM, lifestyle modifications   Interventions:   Diabetes Interventions: Assessed patient's understanding of A1c goal: <7%   Noted decrease in AIC by 1 point, now 8.7 Provided education to patient about basic DM disease process - Discussed how DM affects other systems and importance of controlling BG Reviewed medications with patient and discussed importance of medication adherence Patient compliant with all meds Discussed plans with patient for ongoing care management follow up and provided patient with direct contact information for care management team patient will call for PCP and Vascular appointments Provided patient with written educational materials related to hypo and hyperglycemia and importance of correct treatment; Patient able to  verbalized correct treatment of hypoglycemia, getting glucose tabs as discussed Advised patient, providing education and rationale, to check FBG and before meals and record, calling PCP for findings  outside established parameters. Reported some lows in am - discussed healthy bprotein snack before bed and if continues to notify PCP for possible med adjustment Screening for signs and symptoms of depression related to chronic disease state Seeing counselor, participating in hobbies and will be attending Board Game group this weekend.  Assessed social determinant of health barriers - no needs Patient going to [facility] for eval for dentures Respiratory illness, aware sx to report to provider and red flags for ED LLE/ankle discomfort/swelling, advised to use rollator instead of cane and elevate as much as possible - avoid weight bearing per PCP - aware sx to call provider Starting use of Libre 3 - has friend to assist with first application - using without issue, reports helping her manage BG, seeing how certain food affecting BG and making changes, it tattles on me Had stopped Farxiga - thought was causing diarrhea - will start taking and call Nephrology if issues. Will start ASA 81 mg. Lab Results  Component Value Date   HGBA1C 8.7 (H) 06/04/2024   Provided support and encouragement, patient now checking BG regularly, taking insulin  and motivated to make lifestyle changes to improve health and wellbeing. Discussed resources for healthy eating and carb counting including Carb Counting apps, Recipe apps for quick healthy meals, [employer] YouTube since patient is vegan, meal prep to help patient make healthy choices throughout week, healthy snacks  Messaging PCP regarding leg cramps despite magnesium , fall 3 Hazelyn Kallen ago and ankle still swollen, denies pain and walking normally.  Conference call with PCP - scheduled for acute visit today due to LLE swelling redness and pain for approx 1 week with generalized weakness Patient w/unchanged LLE swelling - red flags for ED provided  Patient Self-Care Activities:  Attend all scheduled provider appointments Call pharmacy for medication refills 3-7  days in advance of running out of medications Call provider office for new concerns or questions  Take medications as prescribed   check blood sugar at prescribed times: At least FBG and before meals then as directed by endo at appointment 6/30 enter blood sugar readings and medication or insulin  into daily log take the blood sugar log to all doctor visits  Plan:  Telephone follow up appointment with care management team member scheduled for:  11/05/2024 at 10:00 am

## 2024-10-22 ENCOUNTER — Telehealth: Payer: Self-pay | Admitting: Dietician

## 2024-10-22 NOTE — Telephone Encounter (Signed)
 Patient is calling re:  glipizide  and metformin  stating that her pharmacy has denied these.  Chart reviewed and she has active prescription for these items.  Pharmacy number provided and patient to follow up and call for further concerns.  Leita Constable, RD, LDN, CDCES, DipACLM

## 2024-10-27 ENCOUNTER — Other Ambulatory Visit: Payer: Self-pay | Admitting: Psychiatry

## 2024-10-27 DIAGNOSIS — F4310 Post-traumatic stress disorder, unspecified: Secondary | ICD-10-CM

## 2024-11-03 ENCOUNTER — Ambulatory Visit: Admitting: "Endocrinology

## 2024-11-05 ENCOUNTER — Telehealth

## 2024-11-10 ENCOUNTER — Inpatient Hospital Stay: Attending: Oncology

## 2024-11-12 ENCOUNTER — Encounter: Payer: Self-pay | Admitting: Nurse Practitioner

## 2024-11-12 ENCOUNTER — Ambulatory Visit: Admitting: Nurse Practitioner

## 2024-11-12 VITALS — BP 112/80 | HR 92 | Temp 98.0°F | Ht 66.0 in | Wt 256.0 lb

## 2024-11-12 DIAGNOSIS — Z794 Long term (current) use of insulin: Secondary | ICD-10-CM

## 2024-11-12 DIAGNOSIS — Z944 Liver transplant status: Secondary | ICD-10-CM

## 2024-11-12 DIAGNOSIS — G8929 Other chronic pain: Secondary | ICD-10-CM

## 2024-11-12 DIAGNOSIS — R300 Dysuria: Secondary | ICD-10-CM

## 2024-11-12 DIAGNOSIS — E785 Hyperlipidemia, unspecified: Secondary | ICD-10-CM

## 2024-11-12 DIAGNOSIS — I10 Essential (primary) hypertension: Secondary | ICD-10-CM

## 2024-11-12 DIAGNOSIS — G43809 Other migraine, not intractable, without status migrainosus: Secondary | ICD-10-CM

## 2024-11-12 DIAGNOSIS — M5116 Intervertebral disc disorders with radiculopathy, lumbar region: Secondary | ICD-10-CM

## 2024-11-12 DIAGNOSIS — F319 Bipolar disorder, unspecified: Secondary | ICD-10-CM

## 2024-11-12 DIAGNOSIS — N182 Chronic kidney disease, stage 2 (mild): Secondary | ICD-10-CM

## 2024-11-12 DIAGNOSIS — M3500 Sicca syndrome, unspecified: Secondary | ICD-10-CM

## 2024-11-12 DIAGNOSIS — M792 Neuralgia and neuritis, unspecified: Secondary | ICD-10-CM

## 2024-11-12 DIAGNOSIS — G4733 Obstructive sleep apnea (adult) (pediatric): Secondary | ICD-10-CM

## 2024-11-12 DIAGNOSIS — F330 Major depressive disorder, recurrent, mild: Secondary | ICD-10-CM

## 2024-11-12 DIAGNOSIS — N3281 Overactive bladder: Secondary | ICD-10-CM

## 2024-11-12 DIAGNOSIS — M7122 Synovial cyst of popliteal space [Baker], left knee: Secondary | ICD-10-CM

## 2024-11-12 DIAGNOSIS — M329 Systemic lupus erythematosus, unspecified: Secondary | ICD-10-CM

## 2024-11-12 DIAGNOSIS — R011 Cardiac murmur, unspecified: Secondary | ICD-10-CM

## 2024-11-12 DIAGNOSIS — F411 Generalized anxiety disorder: Secondary | ICD-10-CM

## 2024-11-12 DIAGNOSIS — C8330 Diffuse large B-cell lymphoma, unspecified site: Secondary | ICD-10-CM

## 2024-11-12 LAB — POCT URINALYSIS DIPSTICK
Bilirubin, UA: NEGATIVE
Blood, UA: NEGATIVE
Glucose, UA: NEGATIVE
Ketones, UA: NEGATIVE
Nitrite, UA: NEGATIVE
Protein, UA: POSITIVE — AB
Spec Grav, UA: 1.02
Urobilinogen, UA: 0.2 U/dL
pH, UA: 5

## 2024-11-12 MED ORDER — CEPHALEXIN 500 MG PO CAPS: 500.0000 mg | ORAL_CAPSULE | Freq: Two times a day (BID) | ORAL | 0 refills | Status: AC

## 2024-11-12 NOTE — Patient Instructions (Signed)
 Voltaren gel

## 2024-11-12 NOTE — Progress Notes (Signed)
 "  BP 112/80   Pulse 92   Temp 98 F (36.7 C)   Ht 5' 6 (1.676 m)   Wt 256 lb (116.1 kg)   LMP 10/16/2021 Comment: Seen by OB GYN on 10/16/21 for 6 days of excessive bleeding as a post menapause  SpO2 97%   BMI 41.32 kg/m    Subjective:    Patient ID: Alison [name], female    DOB: 1976/09/25, 49 y.o.   MRN: [id number]  HPI: Alison [name] is a 49 y.o. female  Chief Complaint  Patient presents with   Dysuria    Pt c/o dysuria x2 weeks.    Mass    Pt c/o mass behind left knee. States it has become painful and increased in size.    Discussed the use of AI scribe software for clinical note transcription with the patient, who gave verbal consent to proceed.  History of Present Illness Alison [name] is a 49 year old female who presents for a six-month follow-up.  Dysuria and suspected urinary tract infection - Dysuria present, raising concern for urinary tract infection - No vaginal discharge - Previous UTI in October 2025 treated with Keflex  for E. coli, tolerated despite known allergy  Popliteal cyst and knee pain - Cyst located behind the knee causing significant pain, especially over the last two days - Pain possibly exacerbated by recent cold front - Previously evaluated by orthopedics; no surgical intervention discussed - No history of steroid injections due to fear  Diabetes mellitus management - Insulin  regimen: Lantus  40 units nightly, Novolog  9 units three times daily with meals - Blood glucose levels in the mid-200s - Recent acquisition of Libre device for glucose monitoring - Last hemoglobin A1c was 8.7% - Current oral agents: glipizide  10 mg daily, Ozempic  2 mg weekly - Discontinued Farxiga due to diarrhea  Chronic pain and musculoskeletal symptoms - Chronic pain managed with oxycodone , baclofen  for back pain, and Lyrica  200 mg twice daily - Wrist pain worsened by cold weather, uses wrist brace  Migraine headaches - History of migraines - No  recent migraine episodes - Uses Ubrelvy  50 mg as needed  Coronary artery disease - History of coronary artery disease - Followed by cardiology  Sleep apnea - Diagnosed with sleep apnea - Does not use CPAP machine regularly  Liver transplant status - Status post liver transplant - Followed by [facility] liver transplant team  Psychiatric conditions - History of bipolar disorder, depression, and anxiety - Followed by psychiatry - Current medications: Effexor  37.5 mg and 75 mg daily, Latuda  20 mg daily, Remeron  15 mg at bedtime, Cogentin  1 mg daily  gerd -stable on  Pantoprazole  40 mg daily  HLD - Rosuvastatin  10 mg daily Lipid Panel     Component Value Date/Time   CHOL 123 06/04/2024 1917   CHOL 173 12/03/2021 0804   TRIG 150 (H) 06/04/2024 1917   HDL 44 06/04/2024 1917   HDL 38 (L) 12/03/2021 0804   CHOLHDL 2.8 06/04/2024 1917   VLDL 30 06/04/2024 1917   LDLCALC 49 06/04/2024 1917   LDLCALC 57 04/01/2024 1043   LABVLDL 34 12/03/2021 0804    Overactive bladder - Myrbetriq  50 mg daily -stable          10/08/2024   10:20 AM 10/04/2024    1:18 PM 09/03/2024   10:28 AM  Depression screen PHQ 2/9  Decreased Interest 1  1  Down, Depressed, Hopeless 1  1  PHQ - 2 Score 2  2  Altered sleeping 1  1  Tired, decreased energy 1  1  Change in appetite 0  0  Feeling bad or failure about yourself  0  0  Trouble concentrating 1  1  Moving slowly or fidgety/restless 0  0  Suicidal thoughts 0  0  PHQ-9 Score 5  5  Difficult doing work/chores Somewhat difficult       Information is confidential and restricted. Go to Review Flowsheets to unlock data.    Relevant past medical, surgical, family and social history reviewed and updated as indicated. Interim medical history since our last visit reviewed. Allergies and medications reviewed and updated.  Review of Systems  Ten systems reviewed and is negative except as mentioned in HPI      Objective:      BP 112/80   Pulse  92   Temp 98 F (36.7 C)   Ht 5' 6 (1.676 m)   Wt 256 lb (116.1 kg)   LMP 10/16/2021 Comment: Seen by OB GYN on 10/16/21 for 6 days of excessive bleeding as a post menapause  SpO2 97%   BMI 41.32 kg/m    Wt Readings from Last 3 Encounters:  11/12/24 256 lb (116.1 kg)  08/10/24 253 lb 12.8 oz (115.1 kg)  08/02/24 255 lb (115.7 kg)    Physical Exam MEASUREMENTS: Weight- 256, BMI- 41.32. GENERAL: Alert, cooperative, well developed, no acute distress HEENT: Normocephalic, normal oropharynx, moist mucous membranes CHEST: Clear to auscultation bilaterally, No wheezes, rhonchi, or crackles CARDIOVASCULAR: Normal heart rate and rhythm, S1 and S2 normal without murmurs ABDOMEN: Soft, non-tender, non-distended, without organomegaly, Normal bowel sounds EXTREMITIES: No cyanosis or edema, pain behind left knee, bakers cyst NEUROLOGICAL: Cranial nerves grossly intact, Moves all extremities without gross motor or sensory deficit  Results for orders placed or performed in visit on 11/12/24  POCT Urinalysis Dipstick   Collection Time: 11/12/24 10:23 AM  Result Value Ref Range   Color, UA yellow    Clarity, UA cloudy    Glucose, UA Negative Negative   Bilirubin, UA negative    Ketones, UA negative    Spec Grav, UA 1.020 1.010 - 1.025   Blood, UA negative    pH, UA 5.0 5.0 - 8.0   Protein, UA Positive (A) Negative   Urobilinogen, UA 0.2 0.2 or 1.0 E.U./dL   Nitrite, UA negative    Leukocytes, UA Large (3+) (A) Negative   Appearance     Odor     *Note: Due to a large number of results and/or encounters for the requested time period, some results have not been displayed. A complete set of results can be found in Results Review.          Assessment & Plan:   Problem List Items Addressed This Visit       Cardiovascular and Mediastinum   Hypertension - Primary   Migraine     Respiratory   OSA on CPAP     Endocrine   Type II diabetes mellitus with renal manifestations (HCC)    Relevant Orders   Microalbumin, urine     Nervous and Auditory   Lumbar disc disease with radiculopathy     Genitourinary   CKD (chronic kidney disease) stage 2, GFR 60-89 ml/min   Overactive bladder     Other   Chronic pain   Systemic lupus erythematosus (SLE) in adult (HCC)   Heart murmur   DLBCL (diffuse large B cell lymphoma) (HCC)   Relevant Medications  cephALEXin  (KEFLEX ) 500 MG capsule   Major depressive disorder, recurrent   Morbid obesity (HCC)   Neuropathic pain   S/P liver transplant (HCC)   GAD (generalized anxiety disorder)   Sjogren's syndrome   Bipolar disorder (manic depression) (HCC)   HLD (hyperlipidemia)   Other Visit Diagnoses       Dysuria       Relevant Medications   cephALEXin  (KEFLEX ) 500 MG capsule   Other Relevant Orders   POCT Urinalysis Dipstick (Completed)   Urine Culture     Synovial cyst of left popliteal space       Relevant Orders   Ambulatory referral to Orthopedic Surgery        Assessment and Plan Assessment & Plan Urinary tract infection Dysuria with previous E. coli UTI treated successfully with Keflex . Current symptoms suggestive of UTI. - Sent urine for culture - Prescribed Keflex   Type 2 diabetes mellitus with diabetic chronic kidney disease A1c was 8.7 in August 2025.  Patient reports last A1c at endocrinology was 7. ?. Blood glucose levels in the mid-200s. Up to date on diabetic foot and eye exams. Microalbuminuria due to diabetes. - Ordered microalbumin urine test -managed by endo Current oral agents: glipizide  10 mg daily, Ozempic  2 mg weekly  Chronic kidney disease stage 2/microalbuminuria -currently on lisinopril  20 mg daily  Morbid obesity BMI of 41.32. Wt Readings from Last 3 Encounters:  11/12/24 256 lb (116.1 kg)  08/10/24 253 lb 12.8 oz (115.1 kg)  08/02/24 255 lb (115.7 kg)    - Encourage continuation of lifestyle modifications, including dietary management and regular exercise. -continue to  increase physical activity, getting at least 150 min of physical activity a week.  Work on including runner, broadcasting/film/video 2 days a week.  - continue eating at a calorie deficit 1600-1700 cal a day, eating a well balanced diet with whole foods, avoiding processed foods.   Patient is motivated to continue working on lifestyle modification.    Diffuse large B-cell lymphoma Followed by oncology.  Liver transplant status Status post liver transplant, followed by [facility] liver transplant team.  - Prograf  1 mg daily  Chronic pain Managed with oxycodone  and Lyrica . Reports increased pain due to recent cold weather. No recent orthopedic consultation for knee cyst. - Recommended Voltaren gel for pain management - Advised follow-up with orthopedics for knee cyst evaluation  Synovial cyst of left popliteal space Synovial cyst behind left knee causing pain. No recent orthopedic consultation or surgical intervention discussed. - Advised follow-up with orthopedics for evaluation and potential treatment options  General health maintenance Up to date on diabetic foot and eye exams. Not using CPAP machine regularly.        Follow up plan: Return in about 6 months (around 05/12/2025) for follow up. "

## 2024-11-19 ENCOUNTER — Telehealth

## 2024-12-02 ENCOUNTER — Ambulatory Visit: Admitting: "Endocrinology

## 2024-12-03 ENCOUNTER — Ambulatory Visit: Payer: Medicaid Other | Admitting: Oncology

## 2024-12-03 ENCOUNTER — Other Ambulatory Visit: Payer: Medicaid Other

## 2025-02-02 ENCOUNTER — Telehealth: Admitting: Psychiatry

## 2025-02-02 ENCOUNTER — Other Ambulatory Visit

## 2025-02-16 ENCOUNTER — Inpatient Hospital Stay

## 2025-02-16 ENCOUNTER — Inpatient Hospital Stay: Admitting: Oncology

## 2025-05-12 ENCOUNTER — Ambulatory Visit: Admitting: Nurse Practitioner
